# Patient Record
Sex: Male | Born: 1950
Health system: Southern US, Community
[De-identification: ages and names within clinical notes are randomized; demographics above are authoritative.]

## PROBLEM LIST (undated history)

## (undated) ENCOUNTER — Emergency Department (HOSPITAL_COMMUNITY): Admission: EM | Payer: Medicare Other | Source: Home / Self Care

## (undated) DIAGNOSIS — I219 Acute myocardial infarction, unspecified: Secondary | ICD-10-CM

## (undated) DIAGNOSIS — M797 Fibromyalgia: Secondary | ICD-10-CM

## (undated) DIAGNOSIS — E785 Hyperlipidemia, unspecified: Secondary | ICD-10-CM

## (undated) DIAGNOSIS — F329 Major depressive disorder, single episode, unspecified: Secondary | ICD-10-CM

## (undated) DIAGNOSIS — K219 Gastro-esophageal reflux disease without esophagitis: Secondary | ICD-10-CM

## (undated) DIAGNOSIS — I4891 Unspecified atrial fibrillation: Secondary | ICD-10-CM

## (undated) DIAGNOSIS — T8859XA Other complications of anesthesia, initial encounter: Secondary | ICD-10-CM

## (undated) DIAGNOSIS — J189 Pneumonia, unspecified organism: Secondary | ICD-10-CM

## (undated) DIAGNOSIS — I5030 Unspecified diastolic (congestive) heart failure: Secondary | ICD-10-CM

## (undated) DIAGNOSIS — F419 Anxiety disorder, unspecified: Secondary | ICD-10-CM

## (undated) DIAGNOSIS — Z8719 Personal history of other diseases of the digestive system: Secondary | ICD-10-CM

## (undated) DIAGNOSIS — I499 Cardiac arrhythmia, unspecified: Secondary | ICD-10-CM

## (undated) DIAGNOSIS — F32A Depression, unspecified: Secondary | ICD-10-CM

## (undated) DIAGNOSIS — R7989 Other specified abnormal findings of blood chemistry: Secondary | ICD-10-CM

## (undated) DIAGNOSIS — L0291 Cutaneous abscess, unspecified: Secondary | ICD-10-CM

## (undated) DIAGNOSIS — L0390 Cellulitis, unspecified: Secondary | ICD-10-CM

## (undated) DIAGNOSIS — Z91199 Patient's noncompliance with other medical treatment and regimen due to unspecified reason: Secondary | ICD-10-CM

## (undated) DIAGNOSIS — I10 Essential (primary) hypertension: Secondary | ICD-10-CM

## (undated) DIAGNOSIS — M1990 Unspecified osteoarthritis, unspecified site: Secondary | ICD-10-CM

## (undated) DIAGNOSIS — I2510 Atherosclerotic heart disease of native coronary artery without angina pectoris: Secondary | ICD-10-CM

## (undated) DIAGNOSIS — A419 Sepsis, unspecified organism: Secondary | ICD-10-CM

## (undated) DIAGNOSIS — Z9119 Patient's noncompliance with other medical treatment and regimen: Secondary | ICD-10-CM

## (undated) HISTORY — DX: Acute myocardial infarction, unspecified: I21.9

## (undated) HISTORY — DX: Depression, unspecified: F32.A

## (undated) HISTORY — DX: Sepsis, unspecified organism: A41.9

## (undated) HISTORY — DX: Other specified abnormal findings of blood chemistry: R79.89

## (undated) HISTORY — DX: Cellulitis, unspecified: L02.91

## (undated) HISTORY — DX: Anxiety disorder, unspecified: F41.9

## (undated) HISTORY — PX: LESION REMOVAL: SHX5196

## (undated) HISTORY — PX: NECK SURGERY: SHX720

## (undated) HISTORY — DX: Hyperlipidemia, unspecified: E78.5

## (undated) HISTORY — DX: Cutaneous abscess, unspecified: L03.90

## (undated) HISTORY — DX: Major depressive disorder, single episode, unspecified: F32.9

## (undated) HISTORY — PX: ESOPHAGOGASTRODUODENOSCOPY: SHX1529

## (undated) HISTORY — DX: Patient's noncompliance with other medical treatment and regimen: Z91.19

## (undated) HISTORY — DX: Patient's noncompliance with other medical treatment and regimen due to unspecified reason: Z91.199

---

## 1998-01-14 ENCOUNTER — Inpatient Hospital Stay (HOSPITAL_COMMUNITY): Admission: EM | Admit: 1998-01-14 | Discharge: 1998-01-15 | Payer: Self-pay | Admitting: Emergency Medicine

## 1998-05-18 HISTORY — PX: HERNIA REPAIR: SHX51

## 1998-05-18 HISTORY — PX: CORONARY STENT PLACEMENT: SHX1402

## 2004-07-31 ENCOUNTER — Emergency Department (HOSPITAL_COMMUNITY): Admission: EM | Admit: 2004-07-31 | Discharge: 2004-07-31 | Payer: Self-pay | Admitting: Emergency Medicine

## 2005-09-11 ENCOUNTER — Ambulatory Visit: Payer: Self-pay | Admitting: Pulmonary Disease

## 2005-09-11 ENCOUNTER — Inpatient Hospital Stay (HOSPITAL_COMMUNITY): Admission: EM | Admit: 2005-09-11 | Discharge: 2005-09-15 | Payer: Self-pay | Admitting: Emergency Medicine

## 2005-09-11 ENCOUNTER — Ambulatory Visit: Payer: Self-pay | Admitting: Internal Medicine

## 2005-09-13 ENCOUNTER — Encounter: Payer: Self-pay | Admitting: Pulmonary Disease

## 2005-09-15 ENCOUNTER — Ambulatory Visit: Payer: Self-pay | Admitting: Internal Medicine

## 2008-07-19 ENCOUNTER — Ambulatory Visit: Payer: Self-pay | Admitting: Cardiovascular Disease

## 2008-07-24 ENCOUNTER — Telehealth (INDEPENDENT_AMBULATORY_CARE_PROVIDER_SITE_OTHER): Payer: Self-pay | Admitting: *Deleted

## 2008-07-25 ENCOUNTER — Ambulatory Visit: Payer: Self-pay

## 2008-07-25 ENCOUNTER — Encounter: Payer: Self-pay | Admitting: Cardiology

## 2008-07-31 DIAGNOSIS — R079 Chest pain, unspecified: Secondary | ICD-10-CM | POA: Insufficient documentation

## 2008-07-31 DIAGNOSIS — E785 Hyperlipidemia, unspecified: Secondary | ICD-10-CM

## 2008-07-31 DIAGNOSIS — E1169 Type 2 diabetes mellitus with other specified complication: Secondary | ICD-10-CM | POA: Insufficient documentation

## 2008-08-02 ENCOUNTER — Encounter: Payer: Self-pay | Admitting: Cardiovascular Disease

## 2008-08-02 ENCOUNTER — Ambulatory Visit: Payer: Self-pay | Admitting: Cardiovascular Disease

## 2008-08-02 DIAGNOSIS — I152 Hypertension secondary to endocrine disorders: Secondary | ICD-10-CM | POA: Insufficient documentation

## 2008-08-02 DIAGNOSIS — I10 Essential (primary) hypertension: Secondary | ICD-10-CM

## 2008-08-02 DIAGNOSIS — E1159 Type 2 diabetes mellitus with other circulatory complications: Secondary | ICD-10-CM | POA: Insufficient documentation

## 2008-08-02 DIAGNOSIS — E119 Type 2 diabetes mellitus without complications: Secondary | ICD-10-CM | POA: Insufficient documentation

## 2008-08-02 LAB — CONVERTED CEMR LAB
BUN: 20 mg/dL (ref 6–23)
Basophils Absolute: 0 10*3/uL (ref 0.0–0.1)
Basophils Relative: 0.1 % (ref 0.0–3.0)
CO2: 28 meq/L (ref 19–32)
Calcium: 9.5 mg/dL (ref 8.4–10.5)
Chloride: 100 meq/L (ref 96–112)
Creatinine, Ser: 0.8 mg/dL (ref 0.4–1.5)
Eosinophils Absolute: 0.2 10*3/uL (ref 0.0–0.7)
Eosinophils Relative: 2.2 % (ref 0.0–5.0)
GFR calc non Af Amer: 105.6 mL/min (ref >60)
Glucose, Bld: 200 mg/dL — ABNORMAL HIGH (ref 70–99)
HCT: 45.5 % (ref 39.0–52.0)
Hemoglobin: 15.7 g/dL (ref 13.0–17.0)
INR: 1 (ref 0.8–1.0)
Lymphocytes Relative: 18.7 % (ref 12.0–46.0)
Lymphs Abs: 1.5 10*3/uL (ref 0.7–4.0)
MCHC: 34.6 g/dL (ref 30.0–36.0)
MCV: 86.4 fL (ref 78.0–100.0)
Monocytes Absolute: 0.8 10*3/uL (ref 0.1–1.0)
Monocytes Relative: 10.2 % (ref 3.0–12.0)
Neutro Abs: 5.7 10*3/uL (ref 1.4–7.7)
Neutrophils Relative %: 68.8 % (ref 43.0–77.0)
Platelets: 248 10*3/uL (ref 150.0–400.0)
Potassium: 4.3 meq/L (ref 3.5–5.1)
Prothrombin Time: 11.3 s (ref 10.9–13.3)
RBC: 5.26 M/uL (ref 4.22–5.81)
RDW: 11.9 % (ref 11.5–14.6)
Sodium: 135 meq/L (ref 135–145)
WBC: 8.2 10*3/uL (ref 4.5–10.5)

## 2008-08-08 ENCOUNTER — Ambulatory Visit: Payer: Self-pay | Admitting: Cardiology

## 2008-08-08 ENCOUNTER — Inpatient Hospital Stay (HOSPITAL_BASED_OUTPATIENT_CLINIC_OR_DEPARTMENT_OTHER): Admission: RE | Admit: 2008-08-08 | Discharge: 2008-08-08 | Payer: Self-pay | Admitting: Cardiology

## 2008-08-09 ENCOUNTER — Inpatient Hospital Stay (HOSPITAL_COMMUNITY): Admission: AD | Admit: 2008-08-09 | Discharge: 2008-08-10 | Payer: Self-pay | Admitting: Cardiology

## 2008-08-09 ENCOUNTER — Ambulatory Visit: Payer: Self-pay | Admitting: Cardiology

## 2008-08-22 ENCOUNTER — Ambulatory Visit: Payer: Self-pay | Admitting: Cardiovascular Disease

## 2008-08-22 ENCOUNTER — Encounter: Payer: Self-pay | Admitting: Cardiovascular Disease

## 2008-10-04 ENCOUNTER — Telehealth (INDEPENDENT_AMBULATORY_CARE_PROVIDER_SITE_OTHER): Payer: Self-pay | Admitting: *Deleted

## 2009-01-11 ENCOUNTER — Encounter (INDEPENDENT_AMBULATORY_CARE_PROVIDER_SITE_OTHER): Payer: Self-pay | Admitting: *Deleted

## 2009-04-16 ENCOUNTER — Encounter: Payer: Self-pay | Admitting: Cardiovascular Disease

## 2009-04-16 DIAGNOSIS — R011 Cardiac murmur, unspecified: Secondary | ICD-10-CM | POA: Insufficient documentation

## 2009-04-22 ENCOUNTER — Encounter (INDEPENDENT_AMBULATORY_CARE_PROVIDER_SITE_OTHER): Payer: Self-pay | Admitting: *Deleted

## 2009-04-25 LAB — CONVERTED CEMR LAB
ALT: 27 units/L (ref 0–53)
AST: 20 units/L (ref 0–37)
Albumin: 4.3 g/dL (ref 3.5–5.2)
Alkaline Phosphatase: 78 units/L (ref 39–117)
Basophils Absolute: 0.1 10*3/uL (ref 0.0–0.1)
Basophils Relative: 0.8 % (ref 0.0–3.0)
Bilirubin, Direct: 0.2 mg/dL (ref 0.0–0.3)
Eosinophils Absolute: 0.1 10*3/uL (ref 0.0–0.7)
Eosinophils Relative: 1.5 % (ref 0.0–5.0)
HCT: 50.5 % (ref 39.0–52.0)
Hemoglobin: 17.2 g/dL — ABNORMAL HIGH (ref 13.0–17.0)
Lymphocytes Relative: 19.1 % (ref 12.0–46.0)
Lymphs Abs: 1.5 10*3/uL (ref 0.7–4.0)
MCHC: 34.1 g/dL (ref 30.0–36.0)
MCV: 89.8 fL (ref 78.0–100.0)
Monocytes Absolute: 0.7 10*3/uL (ref 0.1–1.0)
Monocytes Relative: 9.2 % (ref 3.0–12.0)
Neutro Abs: 5.3 10*3/uL (ref 1.4–7.7)
Neutrophils Relative %: 69.4 % (ref 43.0–77.0)
Platelets: 264 10*3/uL (ref 150.0–400.0)
RBC: 5.63 M/uL (ref 4.22–5.81)
RDW: 12.1 % (ref 11.5–14.6)
Total Bilirubin: 1.2 mg/dL (ref 0.3–1.2)
Total Protein: 7.5 g/dL (ref 6.0–8.3)
WBC: 7.7 10*3/uL (ref 4.5–10.5)

## 2009-08-28 ENCOUNTER — Encounter (INDEPENDENT_AMBULATORY_CARE_PROVIDER_SITE_OTHER): Payer: Self-pay | Admitting: *Deleted

## 2010-06-17 NOTE — Assessment & Plan Note (Signed)
Summary: [facility]   CC:  no complaints flu from cath.  History of Present Illness: [name] [name] had his catheterization 2 weeks ago.  He had a total right coronary occlusion with collaterals.  I reviewed his films.  He had successful angioplasty and stenting by Dr. Dickie La.  Results were excellent.  He did not have significant left-sided disease.  Overall the function was normal.  His groin has healed well.  He was Angio-Seal since discharge she has had some mild pain in his feet.  He is a diabetic and has arthritis in his knees.  The pain sounds more arthritic or neuropathic.  There's been no evidence of distal embolization.  Is not a significant chest pain PND or orthopnea.  He is back to work full-time.  He's been compliant with his aspirin and Plavix.  His risk factors are otherwise well modified.  He continues to try to lose weight.  Current Problems (verified): 1)  Essential Hypertension, Benign  (ICD-401.1) 2)  Aodm  (ICD-250.00) 3)  Disorders of Iron Metabolism  (ICD-275.0) 4)  Hyperlipidemia-mixed  (ICD-272.4) 5)  Chest Pain-unspecified  (ICD-786.50)  Current Medications (verified): 1)  Glucophage 500 Mg Tabs (Metformin Hcl) .Marland Kitchen.. 1 Daily 2)  Lipitor 20 Mg Tabs (Atorvastatin Calcium) .Marland Kitchen.. 1 Qhs 3)  Lisinopril 20 Mg Tabs (Lisinopril) .Marland Kitchen.. 1 Tab Daily 4)  Kapidex 60 Mg Cpdr (Dexlansoprazole) .Marland Kitchen.. 1 Daily 5)  Arthrotec 75mg  .... 1 Tab By Mouth Once Daily 6)  Plavix 75 Mg Tabs (Clopidogrel Bisulfate) .Marland Kitchen.. 1 Tab By Mouth Once Daily 7)  Aspirin 81 Mg  Tabs (Aspirin) .... 4 Tabs Qam 8)  Fish Oil   Oil (Fish Oil) 1000mg  .... 2 Tabs At Bedtime  Allergies: 1)  * Horse Serum 2)  * Shellfish  Past History:  Past Medical History:    atypical chest pain    Diabetes Type 1    Hypertension    hx of reflux (07/31/2008)  Social History:    Full Time    Married     Tobacco Use - Former. quit 2006    Alcohol Use - yes    Regular Exercise - no    Drug Use - no     (07/31/2008)  Review  of Systems       Denies fever, malais, weight loss, blurry vision, decreased visual acuity, cough, sputum, SOB, hemoptysis, pleuritic pain, palpitaitons, heartburn, abdominal pain, melena, lower extremity edema, claudication, or rash.   Vital Signs:  Patient profile:   60 year old male Weight:      267 pounds Pulse rate:   80 / minute Resp:     12 per minute BP sitting:   142 / 90  (left arm)  Vitals Entered By: Kem Parkinson (August 22, 2008 10:55 AM)  Physical Exam  General:  Affect appropriate Healthy:  appears stated age HEENT: normal Neck supple with no adenopathy JVP normal no bruits no thyromegaly Lungs clear with no wheezing and good diaphragmatic motion Heart:  S1/S2 no murmur,rub, gallop or click PMI normal Abdomen: benighn, BS positve, no tenderness, no AAA no bruit.  No HSM or HJR Distal pulses intact with no bruits No edema Neuro non-focal Skin warm and dry Cath site well healed with no bruit   Impression & Recommendations:  Problem # 1:  CHEST PAIN-UNSPECIFIED (ICD-786.50) Recent stent to total RCA by Dr. Juanda Chance.  Continue Plavix for a year His updated medication list for this problem includes:    Lisinopril 20 Mg Tabs (  Lisinopril) .Marland Kitchen... 1 tab daily    Plavix 75 Mg Tabs (Clopidogrel bisulfate) .Marland Kitchen... 1 tab by mouth once daily    Aspirin 81 Mg Tabs (Aspirin) .Marland KitchenMarland KitchenMarland KitchenMarland Kitchen 4 tabs qam  Problem # 2:  ESSENTIAL HYPERTENSION, BENIGN (ICD-401.1) Continue meds and low sodium diet His updated medication list for this problem includes:    Lisinopril 20 Mg Tabs (Lisinopril) .Marland Kitchen... 1 tab daily    Aspirin 81 Mg Tabs (Aspirin) .Marland KitchenMarland KitchenMarland KitchenMarland Kitchen 4 tabs qam  Problem # 3:  AODM (ICD-250.00) HbA1c per primary.  Continue low carb diet His updated medication list for this problem includes:    Glucophage 500 Mg Tabs (Metformin hcl) .Marland Kitchen... 1 daily    Lisinopril 20 Mg Tabs (Lisinopril) .Marland Kitchen... 1 tab daily    Aspirin 81 Mg Tabs (Aspirin) .Marland KitchenMarland KitchenMarland KitchenMarland Kitchen 4 tabs qam  Problem # 4:  HYPERLIPIDEMIA-MIXED  (ICD-272.4) Lipid and liver in 6 months. His updated medication list for this problem includes:    Lipitor 20 Mg Tabs (Atorvastatin calcium) .Marland Kitchen... 1 qhs  Patient Instructions: 1)  F/U Dr. Eden Emms 6 months 2)  Continue ASA and Plavix !!!

## 2010-06-17 NOTE — Letter (Signed)
Summary: Appointment - Reminder 2  [facility], Main Office  [address]   [place], [place] [address]   Phone: [phone]  Fax: [phone]     August 28, 2009 MRN: [id number]   CHARLI LIBERATORE [address] [place], [place]  [address]   Dear Mr. Hornaday,  Our records indicate that it is time to schedule a follow-up appointment with Dr. Eden Emms. It is very important that we reach you to schedule this appointment. We look forward to participating in your health care needs. Please contact us at the number listed above at your earliest convenience to schedule your appointment.  If you are unable to make an appointment at this time, give [place] a call so we can update our records.     Sincerely,   Migdalia Dk [facility] Scheduling Team

## 2010-06-17 NOTE — Progress Notes (Signed)
   Phone Note From Other Clinic   Caller: pts dentist office Summary of Call: CALLED PT IN CHAIR TO HAVE TOOTH EXTRACTED  TOOTH VERY LOOSE  CONCERN  CAUSE   PT IS ON PLAVIX OKAY TO PULL PER DR Eden Emms .Zack Seal

## 2010-08-28 LAB — CBC
HCT: 40 % (ref 39.0–52.0)
HCT: 41.3 % (ref 39.0–52.0)
HCT: 44 % (ref 39.0–52.0)
Hemoglobin: 13.7 g/dL (ref 13.0–17.0)
Hemoglobin: 14.5 g/dL (ref 13.0–17.0)
Hemoglobin: 15.6 g/dL (ref 13.0–17.0)
MCHC: 34.3 g/dL (ref 30.0–36.0)
MCHC: 35.2 g/dL (ref 30.0–36.0)
MCHC: 35.6 g/dL (ref 30.0–36.0)
MCV: 86.3 fL (ref 78.0–100.0)
MCV: 86.5 fL (ref 78.0–100.0)
MCV: 86.6 fL (ref 78.0–100.0)
Platelets: 262 10*3/uL (ref 150–400)
Platelets: 270 10*3/uL (ref 150–400)
Platelets: 302 10*3/uL (ref 150–400)
RBC: 4.61 MIL/uL (ref 4.22–5.81)
RBC: 4.79 MIL/uL (ref 4.22–5.81)
RBC: 5.08 MIL/uL (ref 4.22–5.81)
RDW: 12.4 % (ref 11.5–15.5)
RDW: 12.5 % (ref 11.5–15.5)
RDW: 12.7 % (ref 11.5–15.5)
WBC: 10.8 10*3/uL — ABNORMAL HIGH (ref 4.0–10.5)
WBC: 13.1 10*3/uL — ABNORMAL HIGH (ref 4.0–10.5)
WBC: 13.3 10*3/uL — ABNORMAL HIGH (ref 4.0–10.5)

## 2010-08-28 LAB — BASIC METABOLIC PANEL
BUN: 23 mg/dL (ref 6–23)
BUN: 23 mg/dL (ref 6–23)
CO2: 26 mEq/L (ref 19–32)
CO2: 27 mEq/L (ref 19–32)
Calcium: 9.2 mg/dL (ref 8.4–10.5)
Calcium: 9.5 mg/dL (ref 8.4–10.5)
Chloride: 102 mEq/L (ref 96–112)
Chloride: 104 mEq/L (ref 96–112)
Creatinine, Ser: 0.97 mg/dL (ref 0.4–1.5)
Creatinine, Ser: 1.03 mg/dL (ref 0.4–1.5)
GFR calc Af Amer: >60 mL/min (ref >60)
GFR calc Af Amer: >60 mL/min (ref >60)
GFR calc non Af Amer: >60 mL/min (ref >60)
GFR calc non Af Amer: >60 mL/min (ref >60)
Glucose, Bld: 217 mg/dL — ABNORMAL HIGH (ref 70–99)
Glucose, Bld: 233 mg/dL — ABNORMAL HIGH (ref 70–99)
Potassium: 3.9 mEq/L (ref 3.5–5.1)
Potassium: 4.1 mEq/L (ref 3.5–5.1)
Sodium: 135 mEq/L (ref 135–145)
Sodium: 136 mEq/L (ref 135–145)

## 2010-08-28 LAB — GLUCOSE, CAPILLARY
Glucose-Capillary: 220 mg/dL — ABNORMAL HIGH (ref 70–99)
Glucose-Capillary: 226 mg/dL — ABNORMAL HIGH (ref 70–99)
Glucose-Capillary: 229 mg/dL — ABNORMAL HIGH (ref 70–99)
Glucose-Capillary: 272 mg/dL — ABNORMAL HIGH (ref 70–99)
Glucose-Capillary: 334 mg/dL — ABNORMAL HIGH (ref 70–99)

## 2010-08-28 LAB — POCT I-STAT GLUCOSE
Glucose, Bld: 239 mg/dL — ABNORMAL HIGH (ref 70–99)
Operator id: 194801

## 2010-09-30 NOTE — Cardiovascular Report (Signed)
NAMETODDRICK, SANNA                  ACCOUNT NO.:  [id number]   MEDICAL RECORD NO.:  [id number]          PATIENT TYPE:  OIB   LOCATION:  1961                         FACILITY:  [facility]   PHYSICIAN:  [name], MD, FACCDATE OF BIRTH:  09-14-1950   DATE OF PROCEDURE:  DATE OF DISCHARGE:  08/08/2008                            CARDIAC CATHETERIZATION   CLINICAL HISTORY:  Mr. Hudnall is 60 year old and was recently evaluated  by Dr. Eden Emms for chest pain.  He had a Myoview scan, which suggested a  possible old inferior infarction and Dr. Eden Emms arranged for him to be  evaluated with angiography.  We studied him yesterday in the [facility] and  found a chronic total occlusion of the right coronary artery, which we  thought was fairly recent.  We brought him back today for intervention.  He also is diabetic and metformin was held.   PROCEDURE:  The procedure was performed via the right femoral arteries  and arterial sheath, and a 6-French AL-1 guiding catheter.  The patient  was given antiemetics bolus infusion and had been previously loaded with  Plavix and aspirin.  We were able to navigate a PT2 Light Support long  wire across the lesion without too much difficulty.  The lesion  had  TIMI I flow.  There was a small channel, which we were able to navigate.  We then predilated the lesion with a 2.25 x 20-mm Voyager balloon  performing two inflations up to 10 atmospheres for 30 seconds.  We then  deployed a 2.75 x 23-mm Xience stent deploying this with one inflation  of 14 atmospheres for 30 seconds.  We then postdilated with a 3.25 x 15-  mm [place] Voyager performing 2 inflation up to 16 atmospheres for 30  seconds.  After completion of the postdilatation, it appeared there was  a small filling defect in the proximal edge of the stent.  There is also  questionable lesion proximal to the stent.  For this reason, we  performed IVUS with the Atlantis ultrasound catheter.  This demonstrated  what  appeared to be a small amount of thrombus in the proximal portion  of the stent.  There was a lesion proximal to the stent.  This did not  appear to be very tight and there was no edge tear.  We felt the stent  was somewhat undersized for the vessel even though it was well opposed,  so went back in with a 3.5 x 20-mm [place] Voyager performing 2 inflations up  to 16 atmospheres for 30 seconds.  There still was a very small residual  filling defect in the proximal portion of the stent, which we thought  probably represented thrombus, so we started him on double bolus  Integrilin infusion.   The patient tolerated the procedure well.  The right femoral artery was  closed with Angio-Seal at the end of the procedure.   RESULTS:  Initially, stenosis in the proximal LAD was 99% with a trickle  of TIMI I flow.  The distal vessel did fill by collaterals.  Following  stenting,  this improved to 0%.  There was a small residual filling  defect in the very proximal portion of the stent.   CONCLUSION:  Successful PCI of the chronic totally occluded right  coronary artery using a Xience drug-eluting stent and IVUS guidance with  improvement central narrowing from 99% to 0%.   DISPOSITION:  The patient returned to [facility] room for further  observation.  He should remain on Plavix for at least a year.      Bruce Elvera Lennox Juanda Chance, MD, [facility]  Electronically Signed     BRB/MEDQ  D:  08/09/2008  T:  08/10/2008  Job:  [id number]   cc:   Noralyn Pick. Eden Emms, MD, [facility]

## 2010-09-30 NOTE — Assessment & Plan Note (Signed)
[facility]                            CARDIOLOGY OFFICE NOTE   NAME:[name] [name], [name] [name]                         MRN:          [id number]  DATE:07/19/2008                            DOB:          12-Nov-1950    Mr. Blanda is a delightful 60 year old patient referred by Dr. Paulita Cradle for question need for heart catheterization and he had 2  episodes of chest pain.  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  Initial episode lasted about 48  hours.  It did sound musculoskeletal in nature.  There was no associated  diaphoresis.  He had mild shortness of breath.  There were no  palpitations or presyncope.  Subsequently, he had a shorter episode a  few days later.  Prior to this, he had not had any significant chest  pains.  Back in 1997, he believes, he had a heart catheterization by the  [facility], which he says was normal.   He has not had a followup stress test since then.  Fortunately, he quit  smoking 4 years ago.  His risk factors are still significant however, he  has hypercholesterolemia, non-insulin-dependent diabetes, and  hypertension.  He is very fearful of sugar pills.  Apparently, he was on  another oral hypoglycemic and had 2 hypoglycemic episodes, which scared  him, I assured him that the Glucophage would not do this.  In general,  he is sedentary and has a fairly poor diet.  He basically likes to watch  TV and work on cars when he is not at his job.   REVIEW OF SYSTEMS:  Otherwise negative.   PAST MEDICAL HISTORY:  Remarkable for hypertension,  hypercholesterolemia, non-insulin-dependent diabetes, previous smoking,  quitting 4 years ago, significant arthritis from his work.  He has not  had previous surgery.  The patient is divorced and remarried.  Unfortunately, he is estranged from his 2 older children.  He drinks on  occasion and quit smoking 4 years ago.  Prior to this though he smoked  2  packs a day.   MEDICATIONS:  1. Glucophage 500 a day.  2. Lipitor 20 a day.  3. Lisinopril 20 a day.  4. Kapidex 60 a day.  5. Arthrotec 75 b.i.d.   ALLERGIES:  No known allergies.   PHYSICAL EXAMINATION:  GENERAL:  Remarkable for an overweight jovial  male in no distress.  VITAL SIGNS:  Blood pressure is 130/80, pulse 80 and regular,  respiratory rate 14, afebrile.  HEENT:  Unremarkable.  NECK:  Carotids are normal without bruit.  No lymphadenopathy,  thyromegaly, or JVP elevation.  LUNGS:  Clear.  Good diaphragmatic motion.  No wheezing.  CARDIAC:  S1 and S2.  Normal heart sounds.  PMI normal.  ABDOMEN:  Benign.  Bowel sounds positive.  No AAA.  No tenderness.  No  bruit.  No hepatosplenomegaly.  No hepatojugular reflux.  EXTREMITIES:  Distal pulses are intact.  No edema.  NEUROLOGIC:  Nonfocal.  SKIN:  Warm and dry.  No muscular weakness.   EKG  shows sinus rhythm, nonspecific ST-T wave changes with lateral T-  wave inversions.   I do not have an old EKG on him.   IMPRESSION:  1. Two isolated episodes of atypical chest pain in the patient with      multiple coronary risk factors that had a normal cath and back in      the late 90s.  I do not think we need to rush in to do a heart      catheterization.  We will start by doing a stress Myoview.  He will      continue his risk factor modification and an aspirin a day.  2. Hypertension, currently well controlled.  Continue current dose of      angiotensin-converting enzyme inhibitor, low-sodium diet.  3. Diabetes.  I encouraged the patient to look at the new [employer].  He seems to have some issues in regards to limiting      carbohydrates, but I think he can do a better job.  I related to      him how all of his medical problems including hypertension,      diabetes, and hypercholesterolemia are related to his weight.  His      arthritis would also be improved with weight loss, although this is       certainly secondary to his manual labor in job.  4. History of reflux.  Continue Kapidex.  I will see Mr. Weisgerber in 6      months unless his Myoview is abnormal.     Theron Arista C. Eden Emms, MD, [facility]  Electronically Signed    PCN/MedQ  DD: 07/19/2008  DT: 07/20/2008  Job #: [id number]

## 2010-09-30 NOTE — Discharge Summary (Signed)
NAMEBERTEL, [name] [name]                  ACCOUNT NO.:  [id number]   MEDICAL RECORD NO.:  [id number]          PATIENT TYPE:  INP   LOCATION:  2502                         FACILITY:  [facility]   PHYSICIAN:  Everardo Beals. Juanda Chance, MD, FACCDATE OF BIRTH:  1950-06-29   DATE OF ADMISSION:  08/09/2008  DATE OF DISCHARGE:  08/10/2008                               DISCHARGE SUMMARY   PRIMARY CARDIOLOGIST:  Theron Arista C. Eden Emms, MD, [facility].   PRIMARY CARE Mort Smelser:  [facility].   DISCHARGE DIAGNOSIS:  Chest pain.   SECONDARY DIAGNOSES:  1. Coronary artery disease status post successful percutaneous      coronary intervention and stenting of a chronic totally occluded      right coronary artery with placement of a 2.75 x 23 mm XIENCE V      drug-eluting stent.  2. Hyperlipidemia.  3. Hypertension.  4. Diabetes mellitus.   ALLERGIES:  HORSE SERUM and SHELLFISH.   PROCEDURES:  Successful percutaneous coronary intervention and stenting  of the right coronary artery with placement of a 2.75 x 23 mm XIENCE V  drug-eluting stent.   HISTORY OF PRESENT ILLNESS:  A 60 year old male with the above problem  list.  He recently saw Dr. Eden Emms in clinic on July 19, 2008 with the  complaints of atypical chest discomfort and subsequently underwent  Myoview stress testing revealing an EF of 47% with convincing evidence  of previous inferior MI.  After discussion with the patient about  cardiac catheterization which took place on  August 08, 2008,  catheterization revealed an occluded right coronary artery with left-to-  right collaterals and normal LV function with an EF of 55%.  It was felt  that percutaneous intervention maybe beneficial given his symptoms.   HOSPITAL COURSE:  The patient represented to the cardiac cath lab on  August 09, 2008 and underwent successful opening of the chronic totally  occluded right coronary artery with placement of a 2.75 x 23 mm XIENCE V  drug-eluting stent.  The patient  tolerated the procedure well and  postprocedure has been ambulating without recurrent symptoms or  limitations.  He will be discharged home today in good condition.   DISCHARGE LABS:  Hemoglobin 13.7, hematocrit 40.0, WBC 13.1, platelets  262.  Sodium 135, potassium 3.9, chloride 104, CO2 26, BUN 23,  creatinine 0.97, glucose 217, and calcium 9.2.   DISPOSITION:  The patient will be discharged home today in good  condition.   FOLLOWUP PLANS AND APPOINTMENTS:  We will arrange for followup with Dr.  Eden Emms on August 22, 2008  at 11 a.m.   DISCHARGE MEDICATIONS:  1. Aspirin 325 mg daily.  2. Plavix 75 mg daily.  3. Lipitor 20 mg nightly.  4. Lisinopril 20 mg daily.  5. Capadex 60 mg daily.  6. Arthrotec 75 b.i.d.  7. Glucophage 500 mg daily, to be resumed on August 12, 2008.  8. Fish oil 2 tablets daily.  9. Nitroglycerin 0.4 mg sublingual p.r.n. chest pain.   OUTSTANDING LAB STUDIES:  None.   DURATION OF DISCHARGE/ENCOUNTER:  35 minutes including physician  time.      Nicolasa Ducking, ANP      [name], MD, [facility]  Electronically Signed    CB/MEDQ  D:  08/10/2008  T:  08/11/2008  Job:  [id number]   cc:   [facility]

## 2010-09-30 NOTE — Cardiovascular Report (Signed)
[name] [name], [name] [name]                  ACCOUNT NO.:  [id number]   MEDICAL RECORD NO.:  [id number]          PATIENT TYPE:  OIB   LOCATION:  1961                         FACILITY:  [facility]   PHYSICIAN:  [name], MD, FACCDATE OF BIRTH:  1950/11/03   DATE OF PROCEDURE:  08/08/2008  DATE OF DISCHARGE:  08/08/2008                            CARDIAC CATHETERIZATION   CLINICAL HISTORY:  [name] is a 60 year old and has diabetes.  He  recently has had chest pain.  He was seen by Dr. Eden [name], had a Myoview  scan, which suggested inferior scar or ischemia and Dr. Eden [name] arranged  for him to come in for evaluation with angiography.  His symptoms began  about 2 weeks ago.   PROCEDURE:  The procedure was followed by femoral arterial sheath and 4-  [place] preformed coronary catheters.  A front wall arterial puncture was  performed, and Omnipaque contrast was used.  The patient tolerated the  procedure well and left the laboratory in satisfactory condition.   RESULTS:  Left main coronary artery:  Underlying left main coronary  artery is free of significant disease.   Left anterior descending artery:  Left anterior descending artery gave  rise to 2 septal perforators and a small and larger diagonal branch.  There was a 50% lesion after the 2 septal perforators and 2 diagonal  branches in the proximal to midvessel.  There were mild irregularities  elsewhere, but no other significant obstruction.   The circumflex artery:  The circumflex artery gave rise to a small ramus  branch, a large marginal branch and 2 posterolateral branches.  These  vessels were free of significant disease.   The right coronary artery:  The coronary artery was completely and  proximally elevated with small penetration of the occlusion with TIMI I  flow.  The distal vessel consisted of a posterior descending branch,  which filled via collaterals from the LAD.   The left ventriculogram.  Please note, the left  ventriculogram performed  in the RAO projection shows good wall motion with no areas of  hypokinesis.  Estimated ejection fraction is 55%.   HEMODYNAMIC DATA:  The aortic pressure was 123/74 with a mean of 95.  Left ventricular pressure was 123/11.   CONCLUSION:  Coronary artery disease with 50% narrowing in the proximal  mid-left anterior descending, no significant obstruction of circumflex  artery, and total occlusion of the right coronary artery with  collaterals from the left coronary artery and normal left ventricular  function with estimated ejection fraction of 55%.   RECOMMENDATIONS:  The patient has a chronic total occlusion of the right  coronary artery.  Based on symptoms, this appears to probably be present  for about 2 weeks.  He does have TIMI I flow and I think our chances of  getting this opened are reasonably good.  I will discuss this with the  patient, if he is agreeable, we will plan to bring him back tomorrow for  intervention.      Bruce Elvera Lennox Juanda Chance, MD, [facility]  Electronically Signed     BRB/MEDQ  D:  08/08/2008  T:  08/08/2008  Job:  [id number]   cc:   [name] Pick. Eden Emms, MD, [facility]

## 2010-10-03 NOTE — Discharge Summary (Signed)
[name] [name], [name] [name]                  ACCOUNT NO.:  [id number]   MEDICAL RECORD NO.:  [id number]          PATIENT TYPE:  INP   LOCATION:  IC07                          FACILITY:  [facility]   PHYSICIAN:  Osvaldo Shipper, MD     DATE OF BIRTH:  May 24, 1950   DATE OF ADMISSION:  09/11/2005  DATE OF DISCHARGE:  LH                                 DISCHARGE SUMMARY   The patient is being transferred to [facility] ICU within the next one hour.  This is a transfer summary.   DISCHARGE TRANSFER DIAGNOSES:  1.  Caustic alkali ingestion leading to epiglottitis with respiratory      failure.  2.  History of hypertension.   This patient is a 60 year old Caucasian male with a past medical history of  hypertension, coronary artery disease, who apparently was doing well, until  earlier today when he was trying to siphon alkali cleaning solution and he  had ingestion of the solution.  He rapidly developed pain and burning  sensation in the back of his mouth and down his esophagus.  He drank some  water and some milk and a full bottle of Pepto-Bismol.  He got some  transient relief, however, his pain got worse and he decided to come to the  emergency room.  The patient underwent emergent EGD which apparently just  showed some mild inflammation in his esophagus.  No ulcers.  However,  epiglottis was seen to be significantly inflamed.  There was an acute  concern about impending respiratory failure and the patient was electively  intubated.  ENT was consulted, when Dr. Annalee Genta recommended the patient be  transferred to a tertiary level of care.  Dr. Jena Gauss who initially saw this  patient, tried to contact [facility] ICU as well as [facility] ICU but  they do not have beds available.   I came in for a night shift and was attempting to contact [facility],  however, I was informed by the elink physician that a bed might be opening  up at [facility] ICU.  A bed is available at this time and the patient  will  be transferred to [facility] via [facility] within the next half an  hour to one hour.   The patient continues to be critical at this time.  His vital signs while  here are stable.  He has been in a sinus bradycardia.  His blood pressure is  running a little bit low but stable.  The patient is sedated.  His ABGs look  appropriate.  His FIO2 requirements will be reduced.  His blood work that  was obtained was also unremarkable.   MEDICATIONS:  That were started included clindamycin, Solu-Medrol, Protonix.  The patient was prescribed Levaquin as well.  He is on a Diprivan protocol  for sedation.   Further management will be done at [facility] for this sick  patient.      Osvaldo Shipper, MD  Electronically Signed     GK/MEDQ  D:  09/11/2005  T:  09/11/2005  Job:  [id number]   cc:   R. Roetta Sessions, M.D.  [address]  [place]  [place] [address]

## 2010-10-03 NOTE — Discharge Summary (Signed)
NAMEALVEY, [name] [name]                  ACCOUNT NO.:  [id number]   MEDICAL RECORD NO.:  [id number]          PATIENT TYPE:  INP   LOCATION:  2102                         FACILITY:  [facility]   PHYSICIAN:  Coralyn Helling, M.D.      DATE OF BIRTH:  Apr 18, 1951   DATE OF ADMISSION:  09/11/2005  DATE OF DISCHARGE:  09/15/2005                                 DISCHARGE SUMMARY   DISCHARGE DIAGNOSIS:  Upper airway obstruction secondary to caustic alkali  accidental ingestion.   PROCEDURES:  Laryngoscopy, esophagogastroduodenoscopy.   The patient was transferred from [facility] on September 11, 2005.  He  had initially presented to [facility] after driving himself there  to the emergency room.  He was siphoning an alkali cleaning solution while  at work and ingested this.  This fluid had a pH of 13 to 15.5.  He  subsequently developed burning pain in his mouth.  He drink water and a full  bottle of Pepto-Bismol.  While at [facility] he had undergone EGD  and was found to have marked swelling of his epiglottis.  He subsequently  developed respiratory distress and was intubated.  Due to concern of  possible esophageal rupture, the decision was made to have him transferred  to [facility].  He remained on the ventilator.  He had undergone a  repeat EGD here which did not show any signs of trauma.  He subsequently  underwent a laryngoscopy and this showed significant decrease in the  supraglottic edema and the airway appeared stable.  He was subsequently  extubated on September 14, 2005, and had remained stable ever since.   His discharge medications are to include:  1.  Augmentin elixir 400 mg in 500 mL, one teaspoon p.o. b.i.d. x10 days.  2.  Protonix 40 mg p.o. daily.  3.  Prednisone 20 mg p.o. daily x2 days, then 10 mg p.o. daily x2 days, then      5 mg p.o. daily x2 days, then stop.   He is to avoid using ACE inhibitors until he has followup with Dr.  Annalee Genta.  He is  advised to continue on a soft diet until followup with Dr.  Annalee Genta.  He has a followup appointment with Dr. Annalee Genta on Oct 01, 2005, at 1 p.m., and he is not to return to work until after followup with  Dr. Annalee Genta.  Additionally, he is to have followup with Dr. Zenda Alpers at  [facility].      Coralyn Helling, M.D.  Electronically Signed     VS/MEDQ  D:  09/15/2005  T:  09/15/2005  Job:  [id number]   cc:   Onalee Hua L. Annalee Genta, M.D.  Fax: [phone]   Dr., [facility] [place]. Marina Goodell, M.D. [facility]  [address]  [place]  [place] [address]

## 2010-10-03 NOTE — H&P (Signed)
[name] [name], [name] [name]                  ACCOUNT NO.:  [id number]   MEDICAL RECORD NO.:  [id number]          PATIENT TYPE:  INP   LOCATION:  IC07                          FACILITY:  [facility]   PHYSICIAN:  Calvert Cantor, M.D.     DATE OF BIRTH:  02-Oct-1950   DATE OF ADMISSION:  09/11/2005  DATE OF DISCHARGE:  LH                                HISTORY & PHYSICAL   The patient is currently intubated.   HISTORY OF PRESENT ILLNESS:  The history is obtained from Dr. Kendell Bane, Dr.  Katrinka Blazing, and the ER record.  There is no family available, and the patient is  intubated.   The patient was admitted to the ER today after ingestion a caustic liquid.  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  He came into the ER  complaining of odynophagia.  GI consult was called.  Dr. Kendell Bane saw the  patient and did an EGD.  The EGD showed marked edema of the esophagus.  There was some fluid in the stomach, which was suctioned; however, no ulcers  were seen.  As per the endoscopy, the patient was still in the PACU, and he  developed stridor.  He was taken to the OR for emergency intubation.  He was  able to be intubated.  He was then brought back to the ICU, where he pushed  out his endotracheal tube.  He was then reintubated.  Since then, he has  been paralyzed.  He is also on sedation, and he is currently stable.   Dr. Kendell Bane has tried to transfer him.  He has spoken with [facility] and  with [facility].  There are no beds available in either hospital.  Therefore,  the patient is required to stay here.  Dr. Kendell Bane has then called [place] to take  over the care of the patient.   I have spoken with Poison Control.  They had already spoken with Dr.  Margretta Ditty earlier.  Their recommendations are to give him IV fluids and to  make sure good urine output is obtained.  His stool and urine should be  monitored for blood.  They are saying that steroids  and antibiotics are  controversial and do not recommend it either way.   PAST MEDICAL HISTORY:  Obtained from Dr. Luvenia Starch H&P.  1.  Depression.  2.  Hypertension.  3.  Cyst on his right hand.   PAST SURGICAL HISTORY:  1.  Umbilical hernia repair.  2.  Esophageal dilatation.   MEDICATIONS:  1.  Paxil.  2.  Altace 2 mg daily.   ALLERGIES:  No known drug allergies.   SOCIAL HISTORY:  Unobtainable.  He works in a car wash.   PHYSICAL EXAMINATION:  VITAL SIGNS:  His blood pressure was 164/84, pulse  63, respiratory rate 20.  His most recent blood pressure 103/73 with a heart  rate of 63 currently.  GENERAL:  Currently, he is intubated and sedated.  HEENT:  Pupils are equal, round and reactive to light.  Conjunctivae are  pink.  LUNGS:  Clear bilaterally.  HEART:  Regular rate and rhythm.  No murmurs.  ABDOMEN:  Soft.  Bowel sounds are positive.  There is no distention.  Difficult to assess tenderness because he is sedated.  EXTREMITIES:  No clubbing, cyanosis or edema.  Pedal pulses are positive.  SKIN:  No rashes or bruises.  NEUROLOGIC:  Difficult to assess.   Chest x-Jerrik status post intubation shows satisfactory ET tube position and  bibasilar atelectasis.   Blood work is pending.   EKG is pending.   UA is pending.   The monitor is showing sinus rhythm, ranging between 50-65 beats per minute.   ASSESSMENT/PLAN:  A 60 year old white male who is seen for ingestion of an  alkaline fluid.  Currently we are unable to tell how much has been absorbed  into his body.  He has swelling of his epiglottis.  He has been given  steroids for this, which we can continue.  We will continue him on the  ventilator.  I will order an ABG.  In addition, I have ordered a CBC, a  complete MET panel, a PT/INR, and a UA.  We need to monitor for any GI  bleed.  Need to monitor his H&H.   He will be on DVT prophylaxis with SCD stockings and TED hose for now, until  we are sure that he is not  bleeding.  He has a history of hypertension but  currently his blood pressure is a little bit low.  Therefore, will hold off  on giving him any antihypertensives.   He will be transferred to a tertiary care facility when a bed is available.   Dr. Kendell Bane is going to continue to be on consult.  He will eventually need  an ENT consult as well.   Time taken was 65 minutes.      Calvert Cantor, M.D.  Electronically Signed     SR/MEDQ  D:  09/11/2005  T:  09/11/2005  Job:  [id number]

## 2010-10-03 NOTE — Op Note (Signed)
NAMEZACKARIA, BURKEY                  ACCOUNT NO.:  [id number]   MEDICAL RECORD NO.:  [id number]          PATIENT TYPE:  EMS   LOCATION:  ED                            FACILITY:  [facility]   PHYSICIAN:  R. Roetta Sessions, M.D. DATE OF BIRTH:  03-01-1951   DATE OF PROCEDURE:  09/11/2005  DATE OF DISCHARGE:                                 OPERATIVE REPORT   PROCEDURE:  Emergency esophagogastroduodenoscopy.   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  He spat it out immediately, says he did not swallow anything, but  has had severe pain and odynophagia, pointing to his suprasternal notch,  ever since.  This occurred about 11:30 today.  He was seen by Dr. Margretta Ditty.  He was given an unknown volume of water, which he ingested.  For first aid  Mr. San drank some milk and Pepto Bismol.  This occurred in [place].  He came up to [facility], where he has been evaluated.  Dr.  Margretta Ditty called me.  I have seen Mr. Vassar, and he has no respiratory  distress and no stridor.  He is undergoing an urgent EGD.  This approach has  been discussed with the patient and patient's wife, the potential risks,  benefits, and alternatives have been reviewed, questions answered.   PROCEDURE NOTE:  O2 saturation, blood pressure, pulse, and respiration were  monitored throughout the entire procedure.   CONSCIOUS SEDATION:  Versed 3 mg IV, Demerol 75 mg IV, Cetacaine spray for  topical pharyngeal anesthesia.   INSTRUMENT USED:  Olympus video chip system.   FINDINGS:  Examination of the hypopharynx revealed edema of the  hypopharyngeal tissues.  The epiglottis was markedly edematous and took on a  horseshoe appearance.  The opening in the airway was significantly  compromised.  I could see the vocal cords, but  there was a significant  compromise of the lumen at this level.  I easily intubated the esophagus.  There was quite a bit of mucus and overlying exudate on the esophagus.  I  did not spend much time looking at the esophagus, although I did wash the  esophageal mucosa with water.  The EG junction was easily traversed.  There  was no stricture.  There was quite a bit of liquid debris in the stomach.  I  suctioned most of it out.  The gastric mucosa was incompletely seen.  The  pyloric channel was patent and traversed.  Examination of the bulb and  second portion revealed no abnormalities.  I called Dr. Tollie Eth in, our  anesthesiologist, to look at the hypopharynx as I pulled the scope back out  of the esophagus.  Pictures were taken.  The procedure was then terminated.  The patient tolerated this procedure well.   IMPRESSION:  1.  Marked chemical epiglottitis with surrounding soft tissue edema and      significant compromise of  the airway.  The patient does not at this      point have any stridor or obvious respiratory distress.  Exudate      covering the esophagus looked as though much of it washed off the      esophagus.  2.  Stomach incompletely seen.  Liquid in stomach was suctioned out.  3.  Patent pylorus, normal-appearing D1, D2.   PLAN:  1.  The patient will be admitted to the ICU and is in the process of      receiving racemic epinephrine and IV Decadron.  2.  Will consult Dr. Katrinka Blazing to be on standby in case he has airway      compromise.  3.  He will need to have another EGD in 24-48 hours.  Further      recommendations to follow.      Jonathon Bellows, M.D.  Electronically Signed     RMR/MEDQ  D:  09/11/2005  T:  09/12/2005  Job:  [id number]   cc:   Montey Hora, P.A.-C.  [facility]  [place], [place]

## 2011-10-25 ENCOUNTER — Inpatient Hospital Stay (HOSPITAL_COMMUNITY)
Admission: EM | Admit: 2011-10-25 | Discharge: 2011-10-29 | DRG: 313 | Disposition: A | Discharge status: Home / Self Care | Payer: 59 | Attending: Internal Medicine | Admitting: Internal Medicine

## 2011-10-25 ENCOUNTER — Encounter (HOSPITAL_COMMUNITY): Payer: Self-pay | Admitting: *Deleted

## 2011-10-25 ENCOUNTER — Emergency Department (HOSPITAL_COMMUNITY): Payer: Self-pay

## 2011-10-25 DIAGNOSIS — E118 Type 2 diabetes mellitus with unspecified complications: Secondary | ICD-10-CM | POA: Diagnosis present

## 2011-10-25 DIAGNOSIS — E785 Hyperlipidemia, unspecified: Secondary | ICD-10-CM

## 2011-10-25 DIAGNOSIS — E1165 Type 2 diabetes mellitus with hyperglycemia: Secondary | ICD-10-CM | POA: Diagnosis present

## 2011-10-25 DIAGNOSIS — L0390 Cellulitis, unspecified: Secondary | ICD-10-CM | POA: Diagnosis present

## 2011-10-25 DIAGNOSIS — R011 Cardiac murmur, unspecified: Secondary | ICD-10-CM

## 2011-10-25 DIAGNOSIS — I10 Essential (primary) hypertension: Secondary | ICD-10-CM

## 2011-10-25 DIAGNOSIS — E1159 Type 2 diabetes mellitus with other circulatory complications: Secondary | ICD-10-CM | POA: Diagnosis present

## 2011-10-25 DIAGNOSIS — I2510 Atherosclerotic heart disease of native coronary artery without angina pectoris: Secondary | ICD-10-CM

## 2011-10-25 DIAGNOSIS — I252 Old myocardial infarction: Secondary | ICD-10-CM

## 2011-10-25 DIAGNOSIS — IMO0002 Reserved for concepts with insufficient information to code with codable children: Secondary | ICD-10-CM | POA: Diagnosis present

## 2011-10-25 DIAGNOSIS — F17200 Nicotine dependence, unspecified, uncomplicated: Secondary | ICD-10-CM | POA: Diagnosis present

## 2011-10-25 DIAGNOSIS — E782 Mixed hyperlipidemia: Secondary | ICD-10-CM | POA: Diagnosis present

## 2011-10-25 DIAGNOSIS — L02214 Cutaneous abscess of groin: Secondary | ICD-10-CM

## 2011-10-25 DIAGNOSIS — Z9861 Coronary angioplasty status: Secondary | ICD-10-CM

## 2011-10-25 DIAGNOSIS — R079 Chest pain, unspecified: Principal | ICD-10-CM

## 2011-10-25 DIAGNOSIS — L02219 Cutaneous abscess of trunk, unspecified: Secondary | ICD-10-CM | POA: Diagnosis present

## 2011-10-25 DIAGNOSIS — L0291 Cutaneous abscess, unspecified: Secondary | ICD-10-CM | POA: Diagnosis present

## 2011-10-25 DIAGNOSIS — E8881 Metabolic syndrome: Secondary | ICD-10-CM | POA: Diagnosis present

## 2011-10-25 DIAGNOSIS — Z9119 Patient's noncompliance with other medical treatment and regimen: Secondary | ICD-10-CM

## 2011-10-25 DIAGNOSIS — Z7982 Long term (current) use of aspirin: Secondary | ICD-10-CM

## 2011-10-25 DIAGNOSIS — Z91199 Patient's noncompliance with other medical treatment and regimen due to unspecified reason: Secondary | ICD-10-CM

## 2011-10-25 DIAGNOSIS — E1169 Type 2 diabetes mellitus with other specified complication: Secondary | ICD-10-CM | POA: Diagnosis present

## 2011-10-25 DIAGNOSIS — E119 Type 2 diabetes mellitus without complications: Secondary | ICD-10-CM

## 2011-10-25 DIAGNOSIS — B951 Streptococcus, group B, as the cause of diseases classified elsewhere: Secondary | ICD-10-CM | POA: Diagnosis present

## 2011-10-25 HISTORY — DX: Gastro-esophageal reflux disease without esophagitis: K21.9

## 2011-10-25 HISTORY — DX: Essential (primary) hypertension: I10

## 2011-10-25 HISTORY — DX: Atherosclerotic heart disease of native coronary artery without angina pectoris: I25.10

## 2011-10-25 HISTORY — DX: Unspecified osteoarthritis, unspecified site: M19.90

## 2011-10-25 LAB — URINALYSIS, ROUTINE W REFLEX MICROSCOPIC
Bilirubin Urine: NEGATIVE
Glucose, UA: >1000 mg/dL — AB
Hgb urine dipstick: NEGATIVE
Ketones, ur: 40 mg/dL — AB
Leukocytes, UA: NEGATIVE
Nitrite: NEGATIVE
Protein, ur: NEGATIVE mg/dL
Specific Gravity, Urine: 1.015 (ref 1.005–1.030)
Urobilinogen, UA: 0.2 mg/dL (ref 0.0–1.0)
pH: 5.5 (ref 5.0–8.0)

## 2011-10-25 LAB — COMPREHENSIVE METABOLIC PANEL
ALT: 5 U/L (ref 0–53)
AST: 5 U/L (ref 0–37)
Albumin: 3.4 g/dL — ABNORMAL LOW (ref 3.5–5.2)
Alkaline Phosphatase: 85 U/L (ref 39–117)
BUN: 21 mg/dL (ref 6–23)
CO2: 20 mEq/L (ref 19–32)
Calcium: 9.4 mg/dL (ref 8.4–10.5)
Chloride: 92 mEq/L — ABNORMAL LOW (ref 96–112)
Creatinine, Ser: 0.81 mg/dL (ref 0.50–1.35)
GFR calc Af Amer: >90 mL/min (ref >90)
GFR calc non Af Amer: >90 mL/min (ref >90)
Glucose, Bld: 395 mg/dL — ABNORMAL HIGH (ref 70–99)
Potassium: 3.9 mEq/L (ref 3.5–5.1)
Sodium: 128 mEq/L — ABNORMAL LOW (ref 135–145)
Total Bilirubin: 0.4 mg/dL (ref 0.3–1.2)
Total Protein: 6.8 g/dL (ref 6.0–8.3)

## 2011-10-25 LAB — CBC
HCT: 42.4 % (ref 39.0–52.0)
Hemoglobin: 15.6 g/dL (ref 13.0–17.0)
MCH: 31 pg (ref 26.0–34.0)
MCHC: 36.8 g/dL — ABNORMAL HIGH (ref 30.0–36.0)
MCV: 84.1 fL (ref 78.0–100.0)
Platelets: 224 10*3/uL (ref 150–400)
RBC: 5.04 MIL/uL (ref 4.22–5.81)
RDW: 12.9 % (ref 11.5–15.5)
WBC: 13.6 10*3/uL — ABNORMAL HIGH (ref 4.0–10.5)

## 2011-10-25 LAB — URINE MICROSCOPIC-ADD ON

## 2011-10-25 LAB — GLUCOSE, CAPILLARY: Glucose-Capillary: 383 mg/dL — ABNORMAL HIGH (ref 70–99)

## 2011-10-25 LAB — PROTIME-INR
INR: 0.99 (ref 0.00–1.49)
Prothrombin Time: 13.3 seconds (ref 11.6–15.2)

## 2011-10-25 LAB — POCT I-STAT TROPONIN I: Troponin i, poc: 0 ng/mL (ref 0.00–0.08)

## 2011-10-25 LAB — APTT: aPTT: 38 seconds — ABNORMAL HIGH (ref 24–37)

## 2011-10-25 MED ORDER — SODIUM CHLORIDE 0.9 % IV SOLN
1000.0000 mL | Freq: Once | INTRAVENOUS | Status: AC
  Administered 2011-10-25: 1000 mL via INTRAVENOUS

## 2011-10-25 MED ORDER — ASPIRIN 81 MG PO CHEW: 324.0000 mg | CHEWABLE_TABLET | Freq: Once | ORAL | Status: DC

## 2011-10-25 MED ORDER — SODIUM CHLORIDE 0.9 % IV SOLN
1000.0000 mL | INTRAVENOUS | Status: DC
  Administered 2011-10-25: 1000 mL via INTRAVENOUS

## 2011-10-25 MED ORDER — INSULIN ASPART 100 UNIT/ML ~~LOC~~ SOLN
10.0000 [IU] | Freq: Once | SUBCUTANEOUS | Status: AC
  Administered 2011-10-26: 10 [IU] via INTRAVENOUS
  Filled 2011-10-25: qty 1

## 2011-10-25 MED ORDER — SODIUM CHLORIDE 0.9 % IV SOLN: 1000.0000 mL | INTRAVENOUS | Status: DC

## 2011-10-25 MED ORDER — NITROGLYCERIN 0.4 MG SL SUBL: 0.4000 mg | SUBLINGUAL_TABLET | SUBLINGUAL | Status: DC | PRN

## 2011-10-25 MED ORDER — LIDOCAINE HCL 2 % IJ SOLN
10.0000 mL | Freq: Once | INTRAMUSCULAR | Status: AC
  Administered 2011-10-25: 200 mg via INTRADERMAL

## 2011-10-25 MED ORDER — LIDOCAINE HCL (PF) 1 % IJ SOLN
INTRAMUSCULAR | Status: AC
  Filled 2011-10-25: qty 5

## 2011-10-25 NOTE — ED Provider Notes (Cosign Needed)
History     CSN: [id number]  Arrival date & time 10/25/11  2015   First MD Initiated Contact with Patient 10/25/11 2024      Chief Complaint  Patient presents with  . Chest Pain    (Consider location/radiation/quality/duration/timing/severity/associated sxs/prior treatment) HPI Comments: The patient is a 61 year old man who had onset of chest pain around 1 AM. He would have sharp pains in the left chest that subsided dull aching pains. The pain would last about 10 minutes go away. Had some nausea but no vomiting. Ultimately he decided to come to the hospital. He took 4 baby aspirin here. He rates the pain as a 9/10 on the pain is sharp at a baseline is a pain of 45. He has previously had coronary artery stenting in 2010 by Dr. Dickie La. He has been followed subsequently by Dr. Charlton Haws of [facility] cardiology.  It is noteworthy that he is diabetic but hasn't been on medications for this for a couple of months. Finally, he has noted a painful area in the right groin with redness and swelling.  Patient is a 61 y.o. male presenting with chest pain. The history is provided by the patient.  Chest Pain The chest pain began 12 - 24 hours ago. Duration of episode(s) is 10 minutes. The chest pain is unchanged (He has had stuttering episodes of chest pain since about 1 AM.). At its most intense, the pain is at 10/10. The pain is currently at 4/10. The severity of the pain is moderate. Quality:   when  the pain is intense it  iss sharp and it goes doown to a dull pain. The pain does not radiate (Pain is felt in the precordial region with slight radiation to the left anterior chest.). Exacerbated by: Nothing. He tried aspirin for the symptoms. Risk factors: Prior cardiac stenting. Diabetic.  His past medical history is significant for CAD and diabetes.  Procedure history is positive for cardiac catheterization.     Past Medical History  Diagnosis Date  . Diabetes mellitus     Past Surgical History    Procedure Date  . Coronary stent placement     History reviewed. No pertinent family history.  History  Substance Use Topics  . Smoking status: Not on file  . Smokeless tobacco: Not on file  . Alcohol Use:       Review of Systems  Constitutional: Negative.   HENT: Negative.   Eyes: Negative.   Respiratory: Negative.   Cardiovascular: Positive for chest pain.  Gastrointestinal: Negative.   Genitourinary: Negative.   Musculoskeletal: Negative.   Skin:       Abscess right groin   Neurological: Negative.   Psychiatric/Behavioral: Negative.     Allergies  Horse-derived products  Home Medications   Current Outpatient Rx  Name Route Sig Dispense Refill  . ASPIRIN 81 MG PO CHEW Oral Chew 324 mg by mouth once.    . ASPIRIN EC 81 MG PO TBEC Oral Take 81 mg by mouth daily.      BP 130/94  Pulse 112  Temp(Src) 97.9 F (36.6 C) (Oral)  Resp 18  SpO2 97%  Physical Exam  Nursing note and vitals reviewed. Constitutional: He is oriented to person, place, and time. He appears well-developed and well-nourished. Distressed: in mild distress complaining of chest pain and also of a painful area of the right groin.  HENT:  Head: Normocephalic and atraumatic.  Right Ear: External ear normal.  Left Ear: External ear normal.  Mouth/Throat: Oropharynx is clear and moist.  Eyes: Conjunctivae and EOM are normal. Pupils are equal, round, and reactive to light.  Neck: Normal range of motion. Neck supple.  Cardiovascular: Normal rate, regular rhythm and normal heart sounds.   Pulmonary/Chest: Effort normal and breath sounds normal.  Abdominal: Soft. Bowel sounds are normal.  Musculoskeletal: Normal range of motion. He exhibits no edema and no tenderness.  Neurological: He is alert and oriented to person, place, and time.       No sensory or motor deficit.  Skin:       He has a 3 cm area of cellulitis with a central abscessed area. This is located in the right inguinal region just  above the inguinal crease. There is no associated hernia.  Psychiatric: He has a normal mood and affect. His behavior is normal.    ED Course  INCISION AND DRAINAGE Date/Time: 10/25/2011 11:37 PM Performed by: Osvaldo Human Authorized by: Osvaldo Human Consent: Verbal consent obtained. Risks and benefits: risks, benefits and alternatives were discussed Consent given by: patient Patient understanding: patient states understanding of the procedure being performed Patient consent: the patient's understanding of the procedure matches consent given Patient identity confirmed: verbally with patient Time out: Immediately prior to procedure a "time out" was called to verify the correct patient, procedure, equipment, support staff and site/side marked as required. Type: abscess Body area: trunk Location details: abdomen Anesthesia: local infiltration Local anesthetic: lidocaine 2% without epinephrine Patient sedated: no Scalpel size: 11 Incision type: single straight Complexity: simple Drainage: purulent Drainage amount: moderate Wound treatment: drain placed Packing material: 1/4 in iodoform gauze Patient tolerance: Patient tolerated the procedure well with no immediate complications. Comments: C&S of pus was obtained.   (including critical care time)  8:41 PM  Date: 10/25/2011  Rate: 105  Rhythm: sinus tachycardia  QRS Axis: right  Intervals: normal QRS:  Poor R wave progression in precordial leads suggests old anterior myocardial infarction.   ST/T Wave abnormalities: nonspecific T wave changes  Conduction Disutrbances:none  Narrative Interpretation: Abnormal EKG.   Old EKG Reviewed: changes noted--had sinus bradycardia on tracing of 08/10/2008.  8:43 PM Pt was seen and had physical examination was performed.  EKG was nonacute.  Lab tests were ordered.  Setup for I&D of his groin abscess was ordered.    11:55 PM I contacted the [facility] cardiology, Dr. Maryelizabeth Kaufmann, who  discussed the case with him. He advised admitting the patient to internal medicine, as his chest pain had not produced a heart attack in early 24 hours. Also he has uncontrolled diabetes have an abscess in the right groin. He would be glad to consult. Also I contacted Dr. Onalee Hua, hospitalist, who accepts the patient for admission. We will admit the patient to a telemetry bed, [facility] Team 9, Dr. Lavera Guise.  1. Chest pain   2. Diabetes mellitus   3. Abscess of right groin          Carleene Cooper III, MD 10/27/11 [phone]

## 2011-10-25 NOTE — ED Notes (Signed)
Pt states that he is having 5/10 CP. Pain is dull and when he presses on the center of his chest the pain gets better, then when he relieves pressure his pain comes back. Pt states he has had pain like this in the past, before his stent but not with his stent. Pt alert and oriented.

## 2011-10-25 NOTE — ED Notes (Signed)
EDP davidson at bedside to do I & D or groin area abscess.

## 2011-10-25 NOTE — H&P (Signed)
PCP:   Josue Hector, MD, MD   Chief Complaint:  Chest pain  HPI: 61 year old male with a history of diabetes he stopped taking his medication months ago, and coronary artery disease status post stenting, hyperlipidemia, hypertension who presents emergency Department with approximately 24 hours of substernal chest pain has been waxing and waning and not associated with shortness of breath, nausea, vomiting or radiation. He also has abscess that's been in his right groin area for the last day or so has been draining pus. This has been drained by the emergency room staff and packed. He denies any fevers. He denies any lower extremity pain or swelling. He denies any rashes on his body.  Review of Systems:  Otherwise negative  Past Medical History: Past Medical History  Diagnosis Date  . Diabetes mellitus    Past Surgical History  Procedure Date  . Coronary stent placement     Medications: Prior to Admission medications   Medication Sig Start Date End Date Taking? Authorizing Provider  aspirin 81 MG chewable tablet Chew 324 mg by mouth once.   Yes Historical Provider, MD  aspirin EC 81 MG tablet Take 81 mg by mouth daily.   Yes Historical Provider, MD    Allergies:   Allergies  Allergen Reactions  . Horse-Derived Products     REACTION: Rash    Social History:  does not have a smoking history on file. He does not have any smokeless tobacco history on file. His alcohol and drug histories not on file.  Family History: History reviewed. No pertinent family history.  Physical Exam: Filed Vitals:   10/25/11 2027 10/25/11 2343  BP: 130/94 131/83  Pulse: 112 81  Temp: 97.9 F (36.6 C)   TempSrc: Oral   Resp: 18 18  SpO2: 97% 98%   General appearance: alert, cooperative and no distress Lungs: clear to auscultation bilaterally Heart: regular rate and rhythm, S1, S2 normal, no murmur, click, rub or gallop Abdomen: soft, non-tender; bowel sounds normal; no masses,   no organomegaly Extremities: extremities normal, atraumatic, no cyanosis or edema Pulses: 2+ and symmetric Skin: Skin color, texture, turgor normal. No rashes or lesions or rt groin area with approx 4cm area of cellultis with absess s/p i&d with packing material Neurologic: Grossly normal    Labs on Admission:   [facility] 10/25/11 2050  NA 128*  K 3.9  CL 92*  CO2 20  GLUCOSE 395*  BUN 21  CREATININE 0.81  CALCIUM 9.4  MG --  PHOS --    Basename 10/25/11 2050  AST 5  ALT 5  ALKPHOS 85  BILITOT 0.4  PROT 6.8  ALBUMIN 3.4*    Basename 10/25/11 2050  WBC 13.6*  NEUTROABS --  HGB 15.6  HCT 42.4  MCV 84.1  PLT 224   Radiological Exams on Admission: Dg Chest Portable 1 View  10/25/2011  *RADIOLOGY REPORT*  Clinical Data: Chest pain  PORTABLE CHEST - 1 VIEW  Comparison: 09/13/2005  Findings: The apices are obscured by overlying structures.  No focal consolidation.  No pleural effusion or pneumothorax. Cardiomediastinal contours are mildly prominent to upper normal, similar to prior.  No acute osseous finding visualized.  IMPRESSION: Cardiomediastinal contours upper normal limits on prominent however similar to prior.  No definite radiographic evidence of acute cardiopulmonary process.  Original Report Authenticated By: Waneta Martins, M.D.    Assessment/Plan Present on Admission:  61 year old male with multiple risk factors who has chest pain in the right groin abscess  .  CHEST PAIN-UNSPECIFIED .Diabetes mellitus .Cellulitis and abscess rt groin .Essential hypertension, benign .[facility]  Cardiology has been consult to see the patient in the morning. Serial This patient's cardiac enzymes and place on telemetry. Place nitroglycerin paste. Baby aspirin. Place on sliding-scale insulin for his uncontrolled diabetes. Place on doxycycline for this abscess culture has been done by the emergency room staff. We'll keep n.p.o. after midnight in case cardiology wishes  to proceed with cath. Dose Lovenox will need to take subcutaneous every 12 and low-dose beta blocker.  Sindia Kowalczyk A [phone] 10/25/2011, 11:56 PM

## 2011-10-25 NOTE — ED Notes (Signed)
Per EMS: pt started having CP last night at 00:00. Pt states that the pain was dull center to left chest and when he pressed on center chest pain decreased. Pt states that he had some numbness in left arm yesterday. Today while at a friends house he started having CP again. Pt also has not been taking his daily medicines for 2 months. CBG 484 upon EMS arrival and then after 800 NS bolus he went down to 365. Pt took 324mg  of aspirin with EMS. Pt pain went from a 4/10 pain to a 2/10 pain.

## 2011-10-26 ENCOUNTER — Encounter (HOSPITAL_COMMUNITY): Payer: Self-pay | Admitting: *Deleted

## 2011-10-26 DIAGNOSIS — IMO0001 Reserved for inherently not codable concepts without codable children: Secondary | ICD-10-CM

## 2011-10-26 DIAGNOSIS — I2510 Atherosclerotic heart disease of native coronary artery without angina pectoris: Secondary | ICD-10-CM

## 2011-10-26 DIAGNOSIS — L0291 Cutaneous abscess, unspecified: Secondary | ICD-10-CM

## 2011-10-26 DIAGNOSIS — R079 Chest pain, unspecified: Principal | ICD-10-CM

## 2011-10-26 DIAGNOSIS — R072 Precordial pain: Secondary | ICD-10-CM

## 2011-10-26 DIAGNOSIS — L0390 Cellulitis, unspecified: Secondary | ICD-10-CM

## 2011-10-26 DIAGNOSIS — E1165 Type 2 diabetes mellitus with hyperglycemia: Secondary | ICD-10-CM

## 2011-10-26 LAB — BASIC METABOLIC PANEL
BUN: 16 mg/dL (ref 6–23)
CO2: 20 mEq/L (ref 19–32)
Calcium: 9 mg/dL (ref 8.4–10.5)
Chloride: 95 mEq/L — ABNORMAL LOW (ref 96–112)
Creatinine, Ser: 0.71 mg/dL (ref 0.50–1.35)
GFR calc Af Amer: >90 mL/min (ref >90)
GFR calc non Af Amer: >90 mL/min (ref >90)
Glucose, Bld: 271 mg/dL — ABNORMAL HIGH (ref 70–99)
Potassium: 3.4 mEq/L — ABNORMAL LOW (ref 3.5–5.1)
Sodium: 131 mEq/L — ABNORMAL LOW (ref 135–145)

## 2011-10-26 LAB — CARDIAC PANEL(CRET KIN+CKTOT+MB+TROPI)
CK, MB: 2.4 ng/mL (ref 0.3–4.0)
CK, MB: 1.8 ng/mL (ref 0.3–4.0)
CK, MB: 2 ng/mL (ref 0.3–4.0)
Relative Index: INVALID (ref 0.0–2.5)
Relative Index: INVALID (ref 0.0–2.5)
Relative Index: INVALID (ref 0.0–2.5)
Total CK: 59 U/L (ref 7–232)
Total CK: 52 U/L (ref 7–232)
Total CK: 69 U/L (ref 7–232)
Troponin I: <0.3 ng/mL (ref <0.30)
Troponin I: <0.3 ng/mL (ref <0.30)
Troponin I: <0.3 ng/mL (ref <0.30)

## 2011-10-26 LAB — POCT I-STAT TROPONIN I: Troponin i, poc: 0.01 ng/mL (ref 0.00–0.08)

## 2011-10-26 LAB — CBC
HCT: 40.6 % (ref 39.0–52.0)
Hemoglobin: 14.5 g/dL (ref 13.0–17.0)
MCH: 30.1 pg (ref 26.0–34.0)
MCHC: 35.7 g/dL (ref 30.0–36.0)
MCV: 84.2 fL (ref 78.0–100.0)
Platelets: 209 10*3/uL (ref 150–400)
RBC: 4.82 MIL/uL (ref 4.22–5.81)
RDW: 12.8 % (ref 11.5–15.5)
WBC: 11.4 10*3/uL — ABNORMAL HIGH (ref 4.0–10.5)

## 2011-10-26 LAB — GLUCOSE, CAPILLARY
Glucose-Capillary: 256 mg/dL — ABNORMAL HIGH (ref 70–99)
Glucose-Capillary: 253 mg/dL — ABNORMAL HIGH (ref 70–99)
Glucose-Capillary: 260 mg/dL — ABNORMAL HIGH (ref 70–99)
Glucose-Capillary: 252 mg/dL — ABNORMAL HIGH (ref 70–99)
Glucose-Capillary: 259 mg/dL — ABNORMAL HIGH (ref 70–99)
Glucose-Capillary: 315 mg/dL — ABNORMAL HIGH (ref 70–99)

## 2011-10-26 LAB — HEMOGLOBIN A1C
Hgb A1c MFr Bld: 14.3 % — ABNORMAL HIGH (ref <5.7)
Mean Plasma Glucose: 364 mg/dL — ABNORMAL HIGH (ref <117)

## 2011-10-26 MED ORDER — SODIUM CHLORIDE 0.9 % IV SOLN
INTRAVENOUS | Status: AC
  Administered 2011-10-26: 03:00:00 via INTRAVENOUS

## 2011-10-26 MED ORDER — ASPIRIN 81 MG PO CHEW: 324.0000 mg | CHEWABLE_TABLET | Freq: Once | ORAL | Status: DC

## 2011-10-26 MED ORDER — MORPHINE SULFATE 2 MG/ML IJ SOLN
1.0000 mg | INTRAMUSCULAR | Status: DC | PRN
  Administered 2011-10-26 – 2011-10-27 (×4): 1 mg via INTRAVENOUS
  Filled 2011-10-26 (×4): qty 1

## 2011-10-26 MED ORDER — SODIUM CHLORIDE 0.9 % IV SOLN: INTRAVENOUS | Status: DC

## 2011-10-26 MED ORDER — PANTOPRAZOLE SODIUM 40 MG IV SOLR
40.0000 mg | Freq: Two times a day (BID) | INTRAVENOUS | Status: DC
  Administered 2011-10-26 – 2011-10-27 (×3): 40 mg via INTRAVENOUS
  Filled 2011-10-26 (×4): qty 40

## 2011-10-26 MED ORDER — POTASSIUM CHLORIDE CRYS ER 20 MEQ PO TBCR
20.0000 meq | EXTENDED_RELEASE_TABLET | Freq: Two times a day (BID) | ORAL | Status: AC
  Administered 2011-10-26 (×2): 20 meq via ORAL
  Filled 2011-10-26 (×2): qty 1

## 2011-10-26 MED ORDER — DOXYCYCLINE HYCLATE 100 MG PO TABS
100.0000 mg | ORAL_TABLET | Freq: Two times a day (BID) | ORAL | Status: DC
  Administered 2011-10-26: 100 mg via ORAL
  Filled 2011-10-26: qty 1

## 2011-10-26 MED ORDER — ONDANSETRON HCL 4 MG PO TABS: 4.0000 mg | ORAL_TABLET | Freq: Four times a day (QID) | ORAL | Status: DC | PRN

## 2011-10-26 MED ORDER — VANCOMYCIN HCL 1000 MG IV SOLR
1500.0000 mg | Freq: Two times a day (BID) | INTRAVENOUS | Status: DC
  Administered 2011-10-27 – 2011-10-28 (×4): 1500 mg via INTRAVENOUS
  Filled 2011-10-26 (×4): qty 1500

## 2011-10-26 MED ORDER — ALUM & MAG HYDROXIDE-SIMETH 200-200-20 MG/5ML PO SUSP
30.0000 mL | Freq: Four times a day (QID) | ORAL | Status: DC | PRN
  Administered 2011-10-26: 30 mL via ORAL
  Filled 2011-10-26: qty 30

## 2011-10-26 MED ORDER — LIVING WELL WITH DIABETES BOOK
Freq: Once | Status: AC
  Administered 2011-10-26: 12:00:00
  Filled 2011-10-26: qty 1

## 2011-10-26 MED ORDER — SODIUM CHLORIDE 0.9 % IJ SOLN
3.0000 mL | Freq: Two times a day (BID) | INTRAMUSCULAR | Status: DC
  Administered 2011-10-26 – 2011-10-29 (×6): 3 mL via INTRAVENOUS

## 2011-10-26 MED ORDER — NITROGLYCERIN 2 % TD OINT
0.5000 [in_us] | TOPICAL_OINTMENT | Freq: Three times a day (TID) | TRANSDERMAL | Status: DC
  Administered 2011-10-26: 0.5 [in_us] via TOPICAL
  Filled 2011-10-26: qty 30

## 2011-10-26 MED ORDER — ONDANSETRON HCL 4 MG/2ML IJ SOLN: 4.0000 mg | Freq: Four times a day (QID) | INTRAMUSCULAR | Status: DC | PRN

## 2011-10-26 MED ORDER — ENOXAPARIN SODIUM 120 MG/0.8ML ~~LOC~~ SOLN
110.0000 mg | Freq: Two times a day (BID) | SUBCUTANEOUS | Status: DC
  Filled 2011-10-26 (×2): qty 0.8

## 2011-10-26 MED ORDER — ACETAMINOPHEN 325 MG PO TABS
650.0000 mg | ORAL_TABLET | Freq: Four times a day (QID) | ORAL | Status: DC | PRN
  Administered 2011-10-26: 650 mg via ORAL
  Filled 2011-10-26: qty 2

## 2011-10-26 MED ORDER — REGADENOSON 0.4 MG/5ML IV SOLN
0.4000 mg | Freq: Once | INTRAVENOUS | Status: AC
  Administered 2011-10-27: 0.4 mg via INTRAVENOUS
  Filled 2011-10-26: qty 5

## 2011-10-26 MED ORDER — ENOXAPARIN SODIUM 150 MG/ML ~~LOC~~ SOLN: 130.0000 mg | Freq: Two times a day (BID) | SUBCUTANEOUS | Status: DC

## 2011-10-26 MED ORDER — DOXYCYCLINE HYCLATE 100 MG PO TABS
100.0000 mg | ORAL_TABLET | Freq: Two times a day (BID) | ORAL | Status: DC
  Administered 2011-10-26: 100 mg via ORAL
  Filled 2011-10-26 (×2): qty 1

## 2011-10-26 MED ORDER — INSULIN ASPART 100 UNIT/ML ~~LOC~~ SOLN
0.0000 [IU] | SUBCUTANEOUS | Status: DC
  Administered 2011-10-26 (×2): 5 [IU] via SUBCUTANEOUS
  Administered 2011-10-26: 7 [IU] via SUBCUTANEOUS
  Administered 2011-10-26 – 2011-10-27 (×4): 5 [IU] via SUBCUTANEOUS

## 2011-10-26 MED ORDER — METOPROLOL SUCCINATE ER 25 MG PO TB24
25.0000 mg | ORAL_TABLET | Freq: Every day | ORAL | Status: DC
  Administered 2011-10-26 – 2011-10-28 (×3): 25 mg via ORAL
  Filled 2011-10-26 (×4): qty 1

## 2011-10-26 MED ORDER — SUCRALFATE 1 G PO TABS
1.0000 g | ORAL_TABLET | Freq: Three times a day (TID) | ORAL | Status: DC
  Administered 2011-10-26 – 2011-10-29 (×10): 1 g via ORAL
  Filled 2011-10-26 (×15): qty 1

## 2011-10-26 MED ORDER — ENOXAPARIN SODIUM 120 MG/0.8ML ~~LOC~~ SOLN
110.0000 mg | SUBCUTANEOUS | Status: AC
  Administered 2011-10-26: 110 mg via SUBCUTANEOUS
  Filled 2011-10-26: qty 0.8

## 2011-10-26 MED ORDER — VANCOMYCIN HCL 1000 MG IV SOLR
2000.0000 mg | Freq: Once | INTRAVENOUS | Status: AC
  Administered 2011-10-26: 2000 mg via INTRAVENOUS
  Filled 2011-10-26: qty 2000

## 2011-10-26 MED ORDER — ENOXAPARIN SODIUM 40 MG/0.4ML ~~LOC~~ SOLN
40.0000 mg | SUBCUTANEOUS | Status: DC
  Administered 2011-10-26 – 2011-10-28 (×3): 40 mg via SUBCUTANEOUS
  Filled 2011-10-26 (×4): qty 0.4

## 2011-10-26 NOTE — Progress Notes (Signed)
UR Completed Alwyn Cordner Graves-Bigelow, RN,BSN [phone]  

## 2011-10-26 NOTE — Care Management Note (Addendum)
    Page 1 of 2   10/29/2011     10:36:13 AM   CARE MANAGEMENT NOTE 10/29/2011  Patient:  [name] [name], [name] [name]   Account Number:  [id number]  Date Initiated:  10/26/2011  Documentation initiated by:  [name],[name]  Subjective/Objective Assessment:   Pt admitted with cp. Hx DM uncontrolled and has [name] R groin abcess. Plan for Iv antibiotics. Diabetes Educator was consulted and is in the room with pt at this time.     Action/Plan:   CM will f/u for disposiiton needs.   Anticipated DC Date:  10/28/2011   Anticipated DC Plan:  HOME W HOME HEALTH SERVICES  In-house referral  Financial Counselor      DC Planning Services  CM consult      Choice offered to / List presented to:  C-1 Patient        HH arranged  HH-1 RN      [facility] [name] agency  [facility]   Status of service:  Completed, signed off Medicare Important Message given?   (If response is "NO", the following Medicare IM given date fields will be blank) Date Medicare IM given:   Date Additional Medicare IM given:    Discharge Disposition:  HOME/SELF CARE  Per UR Regulation:  Reviewed for med. necessity/level of care/duration of stay  If discussed at Long Length of Stay Meetings, dates discussed:    Comments:  10/29/11 10:33 Letha Cape RN, BSN [phone] patient is for dc to home with hh services [facility] for wound care,patient would like to go with [name] [name] [name] [name] for wound care. Referral made to [name] [name] , Soc will begin 24-48 hrs post discharge.  10-28-11 1004 [name] Graves-Bigelow,RN,BSN Pt admitted with cp and has no insurance. CM called [place] [facility] to see if could make [name] f/u apt. CM made apt for Tuesday June 18 at 3:00 pm with NP [place]. Pt is to bring d/c summary from [name] visist, proof of income and list of meds. He will also see Cheri Rous for asistance with medications. CM did contact [facility] and they will assist with bill and to see if pt is eligible for medicaid. CM did discuss with pt about medicaitons  and purchasing them from [facility] pharmacy due to cost. No further needs from CM at this time.

## 2011-10-26 NOTE — Progress Notes (Signed)
  Echocardiogram 2D Echocardiogram has been performed.  [name] [name] [facility] 10/26/2011, 10:39 AM

## 2011-10-26 NOTE — Consult Note (Signed)
WOC consult Note Reason for Consult: Wound to R groin s/p I&D of abscess Wound type: partial-thickness surgical incision  Measurement: 1cm x 0.1cm x 1.25 cm Wound bed: 100% granulation Drainage (amount, consistency, odor): mod amt sanguineous drainage  Periwound: surrounding 2-3cm is erythematous, indurated, extremely tender to pt Dressing procedure/placement/frequency: wound packed with saline moistened gauze and covered with 2x2 and tape. Gauze packing strip ordered. Will require daily dressing change.   Pt tol procedure poorly - c/o "I'm going to pass out" d/t pain. Recommend pharmacological pain mgmt prior to dressing changes in the future.  Vesta Mixer RN BSN, wound care student Will not plan to follow further unless re-consulted.  [address] RN, MSN, [employer]  [phone]

## 2011-10-26 NOTE — Progress Notes (Addendum)
Subjective: Chest pain at rest None today  Objective: Vital signs in last 24 hours: Filed Vitals:   10/26/11 0030 10/26/11 0130 10/26/11 0215 10/26/11 0500  BP: 142/78 145/71 137/80 124/70  Pulse: 88 87 81 84  Temp:   98.9 F (37.2 C) 98.2 F (36.8 C)  TempSrc:   Oral Oral  Resp: 20 19 18 18   Height:   6\' 2"  (1.88 m)   Weight:   111.6 kg (246 lb 0.5 oz)   SpO2: 96% 99% 98% 100%    Intake/Output Summary (Last 24 hours) at 10/26/11 1200 Last data filed at 10/26/11 0900  Gross per 24 hour  Intake      0 ml  Output    920 ml  Net   -920 ml    Weight change:  General appearance: alert, cooperative and no distress  Lungs: clear to auscultation bilaterally  Heart: regular rate and rhythm, S1, S2 normal, no murmur, click, rub or gallop  Abdomen: soft, non-tender; bowel sounds normal; no masses, no organomegaly  Extremities: extremities normal, atraumatic, no cyanosis or edema  Pulses: 2+ and symmetric  Skin: Skin color, texture, turgor normal. No rashes or lesions or rt groin area with approx 4cm area of cellultis with absess s/p i&d with packing material  Neurologic: Grossly normal    Lab Results: Results for orders placed during the hospital encounter of 10/25/11 (from the past 24 hour(s))  CBC     Status: Abnormal   Collection Time   10/25/11  8:50 PM      Component Value Range   WBC 13.6 (*) 4.0 - 10.5 (K/uL)   RBC 5.04  4.22 - 5.81 (MIL/uL)   Hemoglobin 15.6  13.0 - 17.0 (g/dL)   HCT 16.1  09.6 - 04.5 (%)   MCV 84.1  78.0 - 100.0 (fL)   MCH 31.0  26.0 - 34.0 (pg)   MCHC 36.8 (*) 30.0 - 36.0 (g/dL)   RDW 40.9  81.1 - 91.4 (%)   Platelets 224  150 - 400 (K/uL)  COMPREHENSIVE METABOLIC PANEL     Status: Abnormal   Collection Time   10/25/11  8:50 PM      Component Value Range   Sodium 128 (*) 135 - 145 (mEq/L)   Potassium 3.9  3.5 - 5.1 (mEq/L)   Chloride 92 (*) 96 - 112 (mEq/L)   CO2 20  19 - 32 (mEq/L)   Glucose, Bld 395 (*) 70 - 99 (mg/dL)   BUN 21  6 - 23  (mg/dL)   Creatinine, Ser 7.82  0.50 - 1.35 (mg/dL)   Calcium 9.4  8.4 - 95.6 (mg/dL)   Total Protein 6.8  6.0 - 8.3 (g/dL)   Albumin 3.4 (*) 3.5 - 5.2 (g/dL)   AST 5  0 - 37 (U/L)   ALT 5  0 - 53 (U/L)   Alkaline Phosphatase 85  39 - 117 (U/L)   Total Bilirubin 0.4  0.3 - 1.2 (mg/dL)   GFR calc non Af Amer >90  >90 (mL/min)   GFR calc Af Amer >90  >90 (mL/min)  PROTIME-INR     Status: Normal   Collection Time   10/25/11  8:50 PM      Component Value Range   Prothrombin Time 13.3  11.6 - 15.2 (seconds)   INR 0.99  0.00 - 1.49   APTT     Status: Abnormal   Collection Time   10/25/11  8:50 PM  Component Value Range   aPTT 38 (*) 24 - 37 (seconds)  GLUCOSE, CAPILLARY     Status: Abnormal   Collection Time   10/25/11  8:58 PM      Component Value Range   Glucose-Capillary 383 (*) 70 - 99 (mg/dL)   Comment 1 Notify RN     Comment 2 Documented in Chart    POCT I-STAT TROPONIN I     Status: Normal   Collection Time   10/25/11  9:03 PM      Component Value Range   Troponin i, poc 0.00  0.00 - 0.08 (ng/mL)   Comment 3           URINALYSIS, ROUTINE W REFLEX MICROSCOPIC     Status: Abnormal   Collection Time   10/25/11  9:28 PM      Component Value Range   Color, Urine YELLOW  YELLOW    APPearance CLEAR  CLEAR    Specific Gravity, Urine 1.015  1.005 - 1.030    pH 5.5  5.0 - 8.0    Glucose, UA >1000 (*) NEGATIVE (mg/dL)   Hgb urine dipstick NEGATIVE  NEGATIVE    Bilirubin Urine NEGATIVE  NEGATIVE    Ketones, ur 40 (*) NEGATIVE (mg/dL)   Protein, ur NEGATIVE  NEGATIVE (mg/dL)   Urobilinogen, UA 0.2  0.0 - 1.0 (mg/dL)   Nitrite NEGATIVE  NEGATIVE    Leukocytes, UA NEGATIVE  NEGATIVE   URINE MICROSCOPIC-ADD ON     Status: Abnormal   Collection Time   10/25/11  9:28 PM      Component Value Range   Squamous Epithelial / LPF RARE  RARE    WBC, UA 0-2  <3 (WBC/hpf)   RBC / HPF 0-2  <3 (RBC/hpf)   Bacteria, UA RARE  RARE    Casts HYALINE CASTS (*) NEGATIVE   POCT I-STAT TROPONIN I      Status: Normal   Collection Time   10/25/11 11:55 PM      Component Value Range   Troponin i, poc 0.01  0.00 - 0.08 (ng/mL)   Comment 3           GLUCOSE, CAPILLARY     Status: Abnormal   Collection Time   10/26/11  1:33 AM      Component Value Range   Glucose-Capillary 256 (*) 70 - 99 (mg/dL)   Comment 1 Documented in Chart    CARDIAC PANEL(CRET KIN+CKTOT+MB+TROPI)     Status: Normal   Collection Time   10/26/11  3:07 AM      Component Value Range   Total CK 59  7 - 232 (U/L)   CK, MB 2.4  0.3 - 4.0 (ng/mL)   Troponin I <0.30  <0.30 (ng/mL)   Relative Index RELATIVE INDEX IS INVALID  0.0 - 2.5   BASIC METABOLIC PANEL     Status: Abnormal   Collection Time   10/26/11  3:14 AM      Component Value Range   Sodium 131 (*) 135 - 145 (mEq/L)   Potassium 3.4 (*) 3.5 - 5.1 (mEq/L)   Chloride 95 (*) 96 - 112 (mEq/L)   CO2 20  19 - 32 (mEq/L)   Glucose, Bld 271 (*) 70 - 99 (mg/dL)   BUN 16  6 - 23 (mg/dL)   Creatinine, Ser 4.09  0.50 - 1.35 (mg/dL)   Calcium 9.0  8.4 - 81.1 (mg/dL)   GFR calc non Af Amer >90  >90 (mL/min)  GFR calc Af Amer >90  >90 (mL/min)  CBC     Status: Abnormal   Collection Time   10/26/11  3:14 AM      Component Value Range   WBC 11.4 (*) 4.0 - 10.5 (K/uL)   RBC 4.82  4.22 - 5.81 (MIL/uL)   Hemoglobin 14.5  13.0 - 17.0 (g/dL)   HCT 16.1  09.6 - 04.5 (%)   MCV 84.2  78.0 - 100.0 (fL)   MCH 30.1  26.0 - 34.0 (pg)   MCHC 35.7  30.0 - 36.0 (g/dL)   RDW 40.9  81.1 - 91.4 (%)   Platelets 209  150 - 400 (K/uL)  GLUCOSE, CAPILLARY     Status: Abnormal   Collection Time   10/26/11  3:53 AM      Component Value Range   Glucose-Capillary 253 (*) 70 - 99 (mg/dL)  GLUCOSE, CAPILLARY     Status: Abnormal   Collection Time   10/26/11  7:23 AM      Component Value Range   Glucose-Capillary 260 (*) 70 - 99 (mg/dL)  CARDIAC PANEL(CRET KIN+CKTOT+MB+TROPI)     Status: Normal   Collection Time   10/26/11 10:15 AM      Component Value Range   Total CK 52  7 - 232 (U/L)    CK, MB 1.8  0.3 - 4.0 (ng/mL)   Troponin I <0.30  <0.30 (ng/mL)   Relative Index RELATIVE INDEX IS INVALID  0.0 - 2.5   GLUCOSE, CAPILLARY     Status: Abnormal   Collection Time   10/26/11 11:46 AM      Component Value Range   Glucose-Capillary 252 (*) 70 - 99 (mg/dL)     Micro: No results found for this or any previous visit (from the past 240 hour(s)).  Studies/Results: Dg Chest Portable 1 View  10/25/2011  *RADIOLOGY REPORT*  Clinical Data: Chest pain  PORTABLE CHEST - 1 VIEW  Comparison: 09/13/2005  Findings: The apices are obscured by overlying structures.  No focal consolidation.  No pleural effusion or pneumothorax. Cardiomediastinal contours are mildly prominent to upper normal, similar to prior.  No acute osseous finding visualized.  IMPRESSION: Cardiomediastinal contours upper normal limits on prominent however similar to prior.  No definite radiographic evidence of acute cardiopulmonary process.  Original Report Authenticated By: Waneta Martins, M.D.    Medications:  Scheduled Meds:   . sodium chloride  1,000 mL Intravenous Once  . aspirin  324 mg Oral Once  . enoxaparin (LOVENOX) injection  110 mg Subcutaneous NOW  . enoxaparin (LOVENOX) injection  110 mg Subcutaneous Q12H  . insulin aspart  0-9 Units Subcutaneous Q4H  . insulin aspart  10 Units Intravenous Once  . lidocaine  10 mL Intradermal Once  . living well with diabetes book   Does not apply Once  . metoprolol succinate  25 mg Oral Daily  . nitroGLYCERIN  0.5 inch Topical Q8H  . potassium chloride  20 mEq Oral BID  . sodium chloride  3 mL Intravenous Q12H  . DISCONTD: aspirin  324 mg Oral Once  . DISCONTD: doxycycline  100 mg Oral Q12H  . DISCONTD: doxycycline  100 mg Oral Q12H  . DISCONTD: enoxaparin (LOVENOX) injection  130 mg Subcutaneous Q12H   Continuous Infusions:   . sodium chloride 75 mL/hr at 10/26/11 0243  . DISCONTD: sodium chloride 1,000 mL (10/25/11 2137)  . DISCONTD: sodium chloride    .  DISCONTD: sodium chloride  PRN Meds:.alum & mag hydroxide-simeth, morphine injection, ondansetron (ZOFRAN) IV, ondansetron, DISCONTD: nitroGLYCERIN   Assessment: Principal Problem:  *CHEST PAIN-UNSPECIFIED Active Problems:  HYPERLIPIDEMIA-MIXED  Essential hypertension, benign  Diabetes mellitus  Medically noncompliant  Cellulitis and abscess rt groin  CAD S/P percutaneous coronary angioplasty   Plan: #1 chest pain patient states he had a cardiac cath 5 years ago done by Adolph Pollack neurology History is more suggestive of dyspepsia Chest pain atypical 2-D echo Cardiology notify Discontinue therapeutic Lovenox   #2 diabetes continue sliding scale insulin  #3 cellulitis DC doxycycline changed to vancomycin, wound care consultation appreciated      LOS: 1 day   [facility] 10/26/2011, 12:00 PM

## 2011-10-26 NOTE — ED Notes (Signed)
Admitting MD at bedside.

## 2011-10-26 NOTE — Consult Note (Signed)
ANTIBIOTIC CONSULT NOTE - INITIAL  Pharmacy Consult for Vancomycin Indication: Cellulitis/Abscess R-groin  Allergies  Allergen Reactions  . Horse-Derived Products     REACTION: Rash    Patient Measurements: Height: 6\' 2"  (188 cm) Weight: 246 lb 0.5 oz (111.6 kg) IBW/kg (Calculated) : 82.2    Vital Signs: Temp: 98.2 F (36.8 C) (06/10 0500) Temp src: Oral (06/10 0500) BP: 124/70 mmHg (06/10 0500) Pulse Rate: 84  (06/10 0500) Intake/Output from previous day: 06/09 0701 - 06/10 0700 In: -  Out: 550 [Urine:550] Intake/Output from this shift: Total I/O In: 236 [P.O.:236] Out: 970 [Urine:970]  Labs:  [facility] 10/26/11 0314 10/25/11 2050  WBC 11.4* 13.6*  HGB 14.5 15.6  PLT 209 224  LABCREA -- --  CREATININE 0.71 0.81   Estimated Creatinine Clearance: 128.9 ml/min (by C-G formula based on Cr of 0.71).  Microbiology: No results found for this or any previous visit (from the past 720 hour(s)).  Medical History: Past Medical History  Diagnosis Date  . Diabetes mellitus   . Hypertension   . GERD (gastroesophageal reflux disease)   . Arthritis    Medical non-compliance  Medications:  Prescriptions prior to admission  Medication Sig Dispense Refill  . aspirin 81 MG chewable tablet Chew 324 mg by mouth once.      Marland Kitchen aspirin EC 81 MG tablet Take 81 mg by mouth daily.       Scheduled:    . sodium chloride  1,000 mL Intravenous Once  . aspirin  324 mg Oral Once  . enoxaparin (LOVENOX) injection  110 mg Subcutaneous NOW  . enoxaparin (LOVENOX) injection  40 mg Subcutaneous Q24H  . insulin aspart  0-9 Units Subcutaneous Q4H  . insulin aspart  10 Units Intravenous Once  . lidocaine  10 mL Intradermal Once  . living well with diabetes book   Does not apply Once  . metoprolol succinate  25 mg Oral Daily  . nitroGLYCERIN  0.5 inch Topical Q8H  . pantoprazole (PROTONIX) IV  40 mg Intravenous Q12H  . potassium chloride  20 mEq Oral BID  . sodium chloride  3 mL  Intravenous Q12H  . sucralfate  1 g Oral TID WC & HS  . DISCONTD: aspirin  324 mg Oral Once  . DISCONTD: doxycycline  100 mg Oral Q12H  . DISCONTD: doxycycline  100 mg Oral Q12H  . DISCONTD: enoxaparin (LOVENOX) injection  110 mg Subcutaneous Q12H  . DISCONTD: enoxaparin (LOVENOX) injection  130 mg Subcutaneous Q12H   Anti-infectives     Start     Dose/Rate Route Frequency Ordered Stop   10/26/11 1000   doxycycline (VIBRA-TABS) tablet 100 mg  Status:  Discontinued        100 mg Oral Every 12 hours 10/26/11 0201 10/26/11 1159   10/26/11 0015   doxycycline (VIBRA-TABS) tablet 100 mg  Status:  Discontinued        100 mg Oral Every 12 hours 10/26/11 0000 10/26/11 0205          Assessment:  Patient is a 61 y/o male admitted for treatment of cellulitis/abscess of right groin.  He also has complained of chest pain which is most likely non-cardiac. Estimated Creatinine Clearance: 128.9 ml/min (by C-G formula based on Cr of 0.71).  Wound culture in process.  Goal of Therapy:   Vancomycin trough level 10-15 mcg/ml  Plan:  Vancomycin 2 gm IV x 1, then 1500 mg IV q 12 hours.  Vancomycin trough level when indicated.  Lequisha Cammack, Elisha Headland, Pharm.D. 10/26/2011 1:18 PM

## 2011-10-26 NOTE — Plan of Care (Signed)
Problem: Consults Goal: Diabetes Guidelines if Diabetic/Glucose > 140 If diabetic or lab glucose is > 140 mg/dl - Initiate Diabetes/Hyperglycemia Guidelines & Document Interventions  Outcome: Progressing Patient given [employer] handouts on diabetes, Diabetes book and has been offered to watch the DM videos.

## 2011-10-26 NOTE — Progress Notes (Signed)
Results for [name] [name], [name] [name] (MRN [id number]) as of 10/26/2011 15:54  Ref. Range 10/26/2011 01:33 10/26/2011 03:53 10/26/2011 07:23 10/26/2011 11:46  Glucose-Capillary Latest Range: 70-99 mg/dL 409 (H) 811 (H) 914 (H) 252 (H)   Admitted with CP and uncontrolled diabetes.  A1c 14.3% (10/26/11).  Patient states he quit taking Metformin and Glipizide several months ago b/c he could not afford to go to his primary MD to get prescription refills.    Spoke with patient about the extreme importance of checking and controlling CBGs in order to prevent acute and long-term complications.  Discussed with patient how high blood sugar levels can aggravate his heart disease.  Explained what an A1c is and what it measures.  Reminded patient that his goal A1c as a patient living with diabetes is 7% or less.  Reviewed basic diet information with patient and encouraged patient to check his CBGs at least 2-3 times per day and to record all the results for his primary MD.  Patient's grandaughter, Marisue Ivan was present and involved.  Gave patient information on purchasing an inexpensive CBG meter and strips at [employer].  Meter is $16 and a box of 50 strips is $9.    Discussed with patient the possibility that he made need insulin at d/c b/c of his elevated A1c.  Patient told me he is willing to take insulin if needed.  He told me he would rather take insulin than "pills".  Have ordered an insulin starter kit so RN may begin insulin instruction with patient at bedside.  MD, if you decide to send patient home on insulin, please switch him to a more affordable insulin like Novolin 70/30.  Patient can get Novolin 70/30 insulin at [facility] for $24.88 per vial.  Will follow. Ambrose Finland RN, MSN, CDE Diabetes Coordinator Inpatient Diabetes Program [phone]

## 2011-10-26 NOTE — Consult Note (Signed)
CARDIOLOGY CONSULT NOTE  Patient ID: [name] [name] MRN: [id number] DOB/AGE: June 29, 1950 61 y.o.  Admit date: 10/25/2011 Primary Physician Dr. Lysbeth Galas Primary Cardiologist    Dr. Eden Emms Chief Complaint   Chest Pain  HPI:  The patient has a history of CAD.  He was doing well until two nights ago.  He developed chest pain left of his sternum.  This would come and go.  It was 10/10 at the peak.  It was not like his previous angina which was between his shoulder blades.  This was dull with some "sharp spikes".  It did not radiate.  He was nauseated but without vomiting.  He denies any new SOB.  He has no PND or orthopnea.  He has no palpitations presyncope or syncope.  He has no cough or edema.  He denies fevers or chills.  He was transported via EMS to the ED.  EKG demonstrates non specific T wave changes.  Enzymes x 3 have been negative.  Of note he had a groin abscess that was lanced in the ER.  Past Medical History  Diagnosis Date  . Diabetes mellitus   . Hypertension   . GERD (gastroesophageal reflux disease)   . Arthritis   . CAD (coronary artery disease)     2010  LAD 50%, RCA 100%.  DES to RCA.  EF 55%    Past Surgical History  Procedure Date  . Coronary stent placement   . Hernia repair 2000  . Cystectomy 1980    left hand and lip    Allergies  Allergen Reactions  . Horse-Derived Products     REACTION: Rash   Prescriptions prior to admission  Medication Sig Dispense Refill  . aspirin EC 81 MG tablet Take 81 mg by mouth daily.       Family History  Problem Relation Age of Onset  . Diabetes Father   . Valvular heart disease Father   . Alzheimer's disease Mother     History   Social History  . Marital Status: Divorced    Spouse Name: N/A    Number of Children: 2  . Years of Education: N/A   Occupational History  . Disabled secondary to back pain   Social History Main Topics  . Smoking status: Current Everyday Smoker -- 1.5 packs/day for 51 years    Types:  Cigarettes    Social History Narrative   Lives alone.  Disabled.  Unable to afford medicines.       ROS:  Postive for depression, back pain.  Otherwise as stated in the HPI and negative for all other systems.  Physical Exam: Blood pressure 124/70, pulse 84, temperature 98.2 F (36.8 C), temperature source Oral, resp. rate 18, height 6\' 2"  (1.88 m), weight 246 lb 0.5 oz (111.6 kg), SpO2 100.00%.  GENERAL:  Well appearing HEENT:  Pupils equal round and reactive, fundi not visualized, oral mucosa unremarkable NECK:  No jugular venous distention, waveform within normal limits, carotid upstroke brisk and symmetric, no bruits, no thyromegaly LYMPHATICS:  No cervical, inguinal adenopathy LUNGS:  Clear to auscultation bilaterally BACK:  No CVA tenderness CHEST:  Unremarkable HEART:  PMI not displaced or sustained,S1 and S2 within normal limits, no S3, no S4, no clicks, no rubs, no murmurs ABD:  Flat, positive bowel sounds normal in frequency in pitch, no bruits, no rebound, no guarding, no midline pulsatile mass, no hepatomegaly, no splenomegaly EXT:  2 plus pulses throughout, no edema, no cyanosis no clubbing SKIN:  No  rashes no nodules, dressed tender wound in the right inguinal region. NEURO:  Cranial nerves II through XII grossly intact, motor grossly intact throughout PSYCH:  Cognitively intact, oriented to person place and time  Labs: Lab Results  Component Value Date   BUN 16 10/26/2011   Lab Results  Component Value Date   CREATININE 0.71 10/26/2011   Lab Results  Component Value Date   NA 131* 10/26/2011   K 3.4* 10/26/2011   CL 95* 10/26/2011   CO2 20 10/26/2011   Lab Results  Component Value Date   CKTOTAL 52 10/26/2011   CKMB 1.8 10/26/2011   TROPONINI <0.30 10/26/2011   Lab Results  Component Value Date   WBC 11.4* 10/26/2011   HGB 14.5 10/26/2011   HCT 40.6 10/26/2011   MCV 84.2 10/26/2011   PLT 209 10/26/2011    Lab Results  Component Value Date   ALT 5 10/25/2011    AST 5 10/25/2011   ALKPHOS 85 10/25/2011   BILITOT 0.4 10/25/2011    Radiology: CXR Cardiomediastinal contours upper normal limits on prominent however  similar to prior. No definite radiographic evidence of acute  cardiopulmonary process.   ZOX:WRUEA tachycardia, rate 105, nonspecific inferior and lateral T wave inversion. No old tracings to compare 10/26/2011  ASSESSMENT AND PLAN:   1)  Chest Pain:  Typical and atypical features.  Given the ongoing risk factors he needs a stress test prior to discharge.  He would not be able to walk a treadmill so he will have a [employer].  2)  Diabetes:  He has been noncompliant with medications.  We talked about the risk of death, stroke and renal failure as well as CAD/MI.  Plans per his primary team and Dr. Lysbeth Galas  3)  Tobacco:  He is committed to quitting smoking and he does not need a medication to quit.  4)  Hyperlipidemia:  Lipid profile pending.  He will not take a statin.  He can be educated about diet but I doubt compliance.   SignedRollene Rotunda 10/26/2011, 2:43 PM

## 2011-10-27 ENCOUNTER — Inpatient Hospital Stay (HOSPITAL_COMMUNITY): Payer: Self-pay

## 2011-10-27 DIAGNOSIS — R079 Chest pain, unspecified: Secondary | ICD-10-CM

## 2011-10-27 DIAGNOSIS — IMO0001 Reserved for inherently not codable concepts without codable children: Secondary | ICD-10-CM

## 2011-10-27 DIAGNOSIS — L0291 Cutaneous abscess, unspecified: Secondary | ICD-10-CM

## 2011-10-27 DIAGNOSIS — E1165 Type 2 diabetes mellitus with hyperglycemia: Secondary | ICD-10-CM

## 2011-10-27 DIAGNOSIS — I2510 Atherosclerotic heart disease of native coronary artery without angina pectoris: Secondary | ICD-10-CM

## 2011-10-27 DIAGNOSIS — L0390 Cellulitis, unspecified: Secondary | ICD-10-CM

## 2011-10-27 LAB — GLUCOSE, CAPILLARY
Glucose-Capillary: 240 mg/dL — ABNORMAL HIGH (ref 70–99)
Glucose-Capillary: 261 mg/dL — ABNORMAL HIGH (ref 70–99)
Glucose-Capillary: 270 mg/dL — ABNORMAL HIGH (ref 70–99)
Glucose-Capillary: 309 mg/dL — ABNORMAL HIGH (ref 70–99)
Glucose-Capillary: 405 mg/dL — ABNORMAL HIGH (ref 70–99)

## 2011-10-27 MED ORDER — INSULIN ASPART PROT & ASPART (70-30 MIX) 100 UNIT/ML ~~LOC~~ SUSP
15.0000 [IU] | Freq: Two times a day (BID) | SUBCUTANEOUS | Status: DC
  Filled 2011-10-27: qty 3

## 2011-10-27 MED ORDER — INSULIN ASPART PROT & ASPART (70-30 MIX) 100 UNIT/ML ~~LOC~~ SUSP
25.0000 [IU] | Freq: Two times a day (BID) | SUBCUTANEOUS | Status: DC
  Filled 2011-10-27: qty 3

## 2011-10-27 MED ORDER — MORPHINE SULFATE 2 MG/ML IJ SOLN
2.0000 mg | INTRAMUSCULAR | Status: DC | PRN
  Administered 2011-10-28 – 2011-10-29 (×4): 2 mg via INTRAVENOUS
  Filled 2011-10-27 (×5): qty 1

## 2011-10-27 MED ORDER — INSULIN ASPART PROT & ASPART (70-30 MIX) 100 UNIT/ML ~~LOC~~ SUSP
25.0000 [IU] | Freq: Two times a day (BID) | SUBCUTANEOUS | Status: DC
  Administered 2011-10-27 – 2011-10-28 (×2): 25 [IU] via SUBCUTANEOUS
  Filled 2011-10-27: qty 10

## 2011-10-27 MED ORDER — REGADENOSON 0.4 MG/5ML IV SOLN
INTRAVENOUS | Status: AC
  Administered 2011-10-27: 0.4 mg via INTRAVENOUS
  Filled 2011-10-27: qty 5

## 2011-10-27 MED ORDER — BD GETTING STARTED TAKE HOME KIT: 3/10ML X 30G SYRINGES
1.0000 | Freq: Once | Status: DC
  Filled 2011-10-27: qty 1

## 2011-10-27 MED ORDER — PANTOPRAZOLE SODIUM 40 MG PO TBEC
40.0000 mg | DELAYED_RELEASE_TABLET | Freq: Two times a day (BID) | ORAL | Status: DC
  Administered 2011-10-27 – 2011-10-29 (×4): 40 mg via ORAL
  Filled 2011-10-27 (×2): qty 1
  Filled 2011-10-27: qty 2
  Filled 2011-10-27 (×2): qty 1

## 2011-10-27 MED ORDER — ASPIRIN 81 MG PO CHEW
81.0000 mg | CHEWABLE_TABLET | Freq: Every day | ORAL | Status: DC
  Administered 2011-10-27 – 2011-10-29 (×3): 81 mg via ORAL
  Filled 2011-10-27 (×3): qty 1

## 2011-10-27 MED ORDER — INSULIN ASPART 100 UNIT/ML ~~LOC~~ SOLN
0.0000 [IU] | Freq: Three times a day (TID) | SUBCUTANEOUS | Status: DC
  Administered 2011-10-27: 20 [IU] via SUBCUTANEOUS
  Administered 2011-10-27: 15 [IU] via SUBCUTANEOUS
  Administered 2011-10-28: 11 [IU] via SUBCUTANEOUS
  Administered 2011-10-28: 15 [IU] via SUBCUTANEOUS
  Administered 2011-10-28 – 2011-10-29 (×3): 7 [IU] via SUBCUTANEOUS

## 2011-10-27 MED ORDER — TECHNETIUM TC 99M TETROFOSMIN IV KIT
30.0000 | PACK | Freq: Once | INTRAVENOUS | Status: AC | PRN
  Administered 2011-10-27: 30 via INTRAVENOUS

## 2011-10-27 MED ORDER — TECHNETIUM TC 99M MEDRONATE IV KIT: 25.0000 | PACK | Freq: Once | INTRAVENOUS | Status: AC | PRN

## 2011-10-27 MED ORDER — BD GETTING STARTED TAKE HOME KIT: 1/2ML X 30G SYRINGES: 1.0000 | Freq: Once | Status: DC

## 2011-10-27 MED ORDER — TECHNETIUM TC 99M TETROFOSMIN IV KIT
10.0000 | PACK | Freq: Once | INTRAVENOUS | Status: AC | PRN
  Administered 2011-10-27: 10 via INTRAVENOUS

## 2011-10-27 MED ORDER — BD GETTING STARTED TAKE HOME KIT: 1/2ML X 30G SYRINGES
1.0000 | Freq: Once | Status: AC
  Administered 2011-10-27: 1
  Filled 2011-10-27: qty 1

## 2011-10-27 MED ORDER — ATORVASTATIN CALCIUM 20 MG PO TABS
20.0000 mg | ORAL_TABLET | Freq: Every day | ORAL | Status: DC
  Filled 2011-10-27: qty 1

## 2011-10-27 NOTE — Progress Notes (Signed)
Patient ID: [name] [name], male   DOB: 28-Sep-1950, 61 y.o.   MRN: [id number]    SUBJECTIVE: No further chest pain.      Marland Kitchen aspirin  324 mg Oral Once  . aspirin  81 mg Oral Daily  . enoxaparin (LOVENOX) injection  40 mg Subcutaneous Q24H  . insulin aspart  0-9 Units Subcutaneous Q4H  . living well with diabetes book   Does not apply Once  . metoprolol succinate  25 mg Oral Daily  . pantoprazole (PROTONIX) IV  40 mg Intravenous Q12H  . potassium chloride  20 mEq Oral BID  . regadenoson  0.4 mg Intravenous Once  . sodium chloride  3 mL Intravenous Q12H  . sucralfate  1 g Oral TID WC & HS  . vancomycin (VANCOCIN) 1500 mg IVPB  1,500 mg Intravenous Q12H  . vancomycin (VANCOCIN) 1500 mg IVPB  2,000 mg Intravenous Once  . DISCONTD: doxycycline  100 mg Oral Q12H  . DISCONTD: enoxaparin (LOVENOX) injection  110 mg Subcutaneous Q12H  . DISCONTD: nitroGLYCERIN  0.5 inch Topical Q8H      Filed Vitals:   10/26/11 0500 10/26/11 1400 10/26/11 2100 10/27/11 0500  BP: 124/70 123/76 142/86 119/77  Pulse: 84 72 78 70  Temp: 98.2 F (36.8 C) 99.2 F (37.3 C) 99.4 F (37.4 C) 98.7 F (37.1 C)  TempSrc: Oral  Oral Oral  Resp: 18 18 18 18   Height:      Weight:    109.589 kg (241 lb 9.6 oz)  SpO2: 100% 96% 97% 97%    Intake/Output Summary (Last 24 hours) at 10/27/11 0755 Last data filed at 10/26/11 1800  Gross per 24 hour  Intake   1771 ml  Output   1570 ml  Net    201 ml    LABS: Basic Metabolic Panel:  Basename 10/26/11 0314 10/25/11 2050  NA 131* 128*  K 3.4* 3.9  CL 95* 92*  CO2 20 20  GLUCOSE 271* 395*  BUN 16 21  CREATININE 0.71 0.81  CALCIUM 9.0 9.4  MG -- --  PHOS -- --   Liver Function Tests:  Basename 10/25/11 2050  AST 5  ALT 5  ALKPHOS 85  BILITOT 0.4  PROT 6.8  ALBUMIN 3.4*   No results found for this basename: LIPASE:2,AMYLASE:2 in the last 72 hours CBC:  Basename 10/26/11 0314 10/25/11 2050  WBC 11.4* 13.6*  NEUTROABS -- --  HGB 14.5 15.6  HCT  40.6 42.4  MCV 84.2 84.1  PLT 209 224   Cardiac Enzymes:  Basename 10/26/11 1735 10/26/11 1015 10/26/11 0307  CKTOTAL 69 52 59  CKMB 2.0 1.8 2.4  CKMBINDEX -- -- --  TROPONINI <0.30 <0.30 <0.30   BNP: No components found with this basename: POCBNP:3 D-Dimer: No results found for this basename: DDIMER:2 in the last 72 hours Hemoglobin A1C:  Basename 10/26/11 0230  HGBA1C 14.3*   Fasting Lipid Panel: No results found for this basename: CHOL,HDL,LDLCALC,TRIG,CHOLHDL,LDLDIRECT in the last 72 hours Thyroid Function Tests: No results found for this basename: TSH,T4TOTAL,FREET3,T3FREE,THYROIDAB in the last 72 hours Anemia Panel: No results found for this basename: VITAMINB12,FOLATE,FERRITIN,TIBC,IRON,RETICCTPCT in the last 72 hours  RADIOLOGY: Dg Chest Portable 1 View  10/25/2011  *RADIOLOGY REPORT*  Clinical Data: Chest pain  PORTABLE CHEST - 1 VIEW  Comparison: 09/13/2005  Findings: The apices are obscured by overlying structures.  No focal consolidation.  No pleural effusion or pneumothorax. Cardiomediastinal contours are mildly prominent to upper normal, similar to prior.  No acute osseous finding visualized.  IMPRESSION: Cardiomediastinal contours upper normal limits on prominent however similar to prior.  No definite radiographic evidence of acute cardiopulmonary process.  Original Report Authenticated By: Waneta Martins, M.D.    PHYSICAL EXAM General: NAD Neck: No JVD, no thyromegaly or thyroid nodule.  Lungs: Clear to auscultation bilaterally with normal respiratory effort. CV: Nondisplaced PMI.  Heart regular S1/S2, no S3/S4, no murmur.  No peripheral edema.  No carotid bruit.  Normal pedal pulses.  Abdomen: Soft, nontender, no hepatosplenomegaly, no distention.  Neurologic: Alert and oriented x 3.  Psych: Normal affect. Extremities: No clubbing or cyanosis.   TELEMETRY: Reviewed telemetry pt in NSR  ASSESSMENT AND PLAN:  62 yo with history of DM and CAD (prior  inferior MI with PCI to RCA) presented with atypical chest pain as well as cellulitis.  - Lexiscan myoview today.  - Start ASA 81 mg daily.  - Needs to be on statin => will check lipids when fasting to decide which statin and what dose.  - Tx cellulitis per primary team.   Marca Ancona 10/27/2011 7:57 AM

## 2011-10-27 NOTE — Progress Notes (Signed)
Subjective: Complaining of pain in the groin area   Objective: Vital signs in last 24 hours: Filed Vitals:   10/26/11 0500 10/26/11 1400 10/26/11 2100 10/27/11 0500  BP: 124/70 123/76 142/86 119/77  Pulse: 84 72 78 70  Temp: 98.2 F (36.8 C) 99.2 F (37.3 C) 99.4 F (37.4 C) 98.7 F (37.1 C)  TempSrc: Oral  Oral Oral  Resp: 18 18 18 18   Height:      Weight:    109.589 kg (241 lb 9.6 oz)  SpO2: 100% 96% 97% 97%    Intake/Output Summary (Last 24 hours) at 10/27/11 0907 Last data filed at 10/26/11 1800  Gross per 24 hour  Intake   1771 ml  Output   1200 ml  Net    571 ml    Weight change: -2.011 kg (-4 lb 6.9 oz) GENERAL: Well appearing  HEENT: Pupils equal round and reactive, fundi not visualized, oral mucosa unremarkable  NECK: No jugular venous distention, waveform within normal limits, carotid upstroke brisk and symmetric, no bruits, no thyromegaly  LYMPHATICS: No cervical, inguinal adenopathy  LUNGS: Clear to auscultation bilaterally  BACK: No CVA tenderness  CHEST: Unremarkable  HEART: PMI not displaced or sustained,S1 and S2 within normal limits, no S3, no S4, no clicks, no rubs, no murmurs  ABD: Flat, positive bowel sounds normal in frequency in pitch, no bruits, no rebound, no guarding, no midline pulsatile mass, no hepatomegaly, no splenomegaly  EXT: 2 plus pulses throughout, no edema, no cyanosis no clubbing  SKIN: No rashes no nodules, dressed tender wound in the right inguinal region.  NEURO: Cranial nerves II through XII grossly intact, motor grossly intact throughout  PSYCH: Cognitively intact, oriented to person place and time    Lab Results: Results for orders placed during the hospital encounter of 10/25/11 (from the past 24 hour(s))  CARDIAC PANEL(CRET KIN+CKTOT+MB+TROPI)     Status: Normal   Collection Time   10/26/11 10:15 AM      Component Value Range   Total CK 52  7 - 232 (U/L)   CK, MB 1.8  0.3 - 4.0 (ng/mL)   Troponin I <0.30  <0.30 (ng/mL)    Relative Index RELATIVE INDEX IS INVALID  0.0 - 2.5   GLUCOSE, CAPILLARY     Status: Abnormal   Collection Time   10/26/11 11:46 AM      Component Value Range   Glucose-Capillary 252 (*) 70 - 99 (mg/dL)  GLUCOSE, CAPILLARY     Status: Abnormal   Collection Time   10/26/11  4:35 PM      Component Value Range   Glucose-Capillary 259 (*) 70 - 99 (mg/dL)  CARDIAC PANEL(CRET KIN+CKTOT+MB+TROPI)     Status: Normal   Collection Time   10/26/11  5:35 PM      Component Value Range   Total CK 69  7 - 232 (U/L)   CK, MB 2.0  0.3 - 4.0 (ng/mL)   Troponin I <0.30  <0.30 (ng/mL)   Relative Index RELATIVE INDEX IS INVALID  0.0 - 2.5   GLUCOSE, CAPILLARY     Status: Abnormal   Collection Time   10/26/11  8:04 PM      Component Value Range   Glucose-Capillary 315 (*) 70 - 99 (mg/dL)  GLUCOSE, CAPILLARY     Status: Abnormal   Collection Time   10/27/11 12:05 AM      Component Value Range   Glucose-Capillary 261 (*) 70 - 99 (mg/dL)  GLUCOSE, CAPILLARY  Status: Abnormal   Collection Time   10/27/11  4:52 AM      Component Value Range   Glucose-Capillary 270 (*) 70 - 99 (mg/dL)     Micro: Recent Results (from the past 240 hour(s))  WOUND CULTURE     Status: Normal (Preliminary result)   Collection Time   10/25/11 10:57 PM      Component Value Range Status Comment   Specimen Description WOUND GROIN   Final    Special Requests NONE   Final    Gram Stain     Final    Value: NO WBC SEEN     NO SQUAMOUS EPITHELIAL CELLS SEEN     NO ORGANISMS SEEN   Culture PENDING   Incomplete    Report Status PENDING   Incomplete     Studies/Results: Dg Chest Portable 1 View  10/25/2011  *RADIOLOGY REPORT*  Clinical Data: Chest pain  PORTABLE CHEST - 1 VIEW  Comparison: 09/13/2005  Findings: The apices are obscured by overlying structures.  No focal consolidation.  No pleural effusion or pneumothorax. Cardiomediastinal contours are mildly prominent to upper normal, similar to prior.  No acute osseous  finding visualized.  IMPRESSION: Cardiomediastinal contours upper normal limits on prominent however similar to prior.  No definite radiographic evidence of acute cardiopulmonary process.  Original Report Authenticated By: Waneta Martins, M.D.    Medications:  Scheduled Meds:   . aspirin  324 mg Oral Once  . aspirin  81 mg Oral Daily  . enoxaparin (LOVENOX) injection  40 mg Subcutaneous Q24H  . insulin aspart  0-9 Units Subcutaneous Q4H  . living well with diabetes book   Does not apply Once  . metoprolol succinate  25 mg Oral Daily  . pantoprazole (PROTONIX) IV  40 mg Intravenous Q12H  . potassium chloride  20 mEq Oral BID  . regadenoson      . regadenoson  0.4 mg Intravenous Once  . sodium chloride  3 mL Intravenous Q12H  . sucralfate  1 g Oral TID WC & HS  . vancomycin (VANCOCIN) 1500 mg IVPB  1,500 mg Intravenous Q12H  . vancomycin (VANCOCIN) 1500 mg IVPB  2,000 mg Intravenous Once  . DISCONTD: doxycycline  100 mg Oral Q12H  . DISCONTD: enoxaparin (LOVENOX) injection  110 mg Subcutaneous Q12H  . DISCONTD: nitroGLYCERIN  0.5 inch Topical Q8H   Continuous Infusions:   . sodium chloride 75 mL/hr at 10/26/11 0243   PRN Meds:.acetaminophen, alum & mag hydroxide-simeth, morphine injection, ondansetron (ZOFRAN) IV, ondansetron   Assessment: Principal Problem:  *CHEST PAIN-UNSPECIFIED Active Problems:  HYPERLIPIDEMIA-MIXED  Essential hypertension, benign  Diabetes mellitus  Medically noncompliant  Cellulitis and abscess rt groin  CAD S/P percutaneous coronary angioplasty   Plan: #1 chest pain, likely scan Myoview per cardiology, start aspirin, start the patient on Lipitor, lipid panel  #2 cellulitis, started on vancomycin IV, wound care nurse following, will need 1-2 more days of IV antibiotics  #3 diabetes hemoglobin A1c of 14.3. Noncompliant with metformin and glipizide We'll do insulin teaching start the patient on normal and 70 /30  #4 disposition anticipate  discharge in the next one to 2 days   LOS: 2 days   [facility] 10/27/2011, 9:07 AM

## 2011-10-27 NOTE — Progress Notes (Signed)
Educated patient on the possibility he may need to resume a statin at discharge, depending on results of FLP.  Patient told RN he would not take cholesterol medication because,  "they make me feel awful."  MD notified. [name] [name]

## 2011-10-27 NOTE — Progress Notes (Signed)
Pt's CBG is 405.  MD notified.  New orders received.

## 2011-10-28 DIAGNOSIS — I2510 Atherosclerotic heart disease of native coronary artery without angina pectoris: Secondary | ICD-10-CM

## 2011-10-28 DIAGNOSIS — L0291 Cutaneous abscess, unspecified: Secondary | ICD-10-CM

## 2011-10-28 DIAGNOSIS — E1165 Type 2 diabetes mellitus with hyperglycemia: Secondary | ICD-10-CM

## 2011-10-28 DIAGNOSIS — IMO0001 Reserved for inherently not codable concepts without codable children: Secondary | ICD-10-CM

## 2011-10-28 DIAGNOSIS — L0390 Cellulitis, unspecified: Secondary | ICD-10-CM

## 2011-10-28 DIAGNOSIS — R079 Chest pain, unspecified: Secondary | ICD-10-CM

## 2011-10-28 LAB — CBC
HCT: 41.3 % (ref 39.0–52.0)
Hemoglobin: 14.6 g/dL (ref 13.0–17.0)
MCH: 29.8 pg (ref 26.0–34.0)
MCHC: 35.4 g/dL (ref 30.0–36.0)
MCV: 84.3 fL (ref 78.0–100.0)
Platelets: 220 10*3/uL (ref 150–400)
RBC: 4.9 MIL/uL (ref 4.22–5.81)
RDW: 12.6 % (ref 11.5–15.5)
WBC: 8.1 10*3/uL (ref 4.0–10.5)

## 2011-10-28 LAB — GLUCOSE, CAPILLARY
Glucose-Capillary: 155 mg/dL — ABNORMAL HIGH (ref 70–99)
Glucose-Capillary: 248 mg/dL — ABNORMAL HIGH (ref 70–99)
Glucose-Capillary: 270 mg/dL — ABNORMAL HIGH (ref 70–99)
Glucose-Capillary: 306 mg/dL — ABNORMAL HIGH (ref 70–99)
Glucose-Capillary: 316 mg/dL — ABNORMAL HIGH (ref 70–99)
Glucose-Capillary: 350 mg/dL — ABNORMAL HIGH (ref 70–99)

## 2011-10-28 LAB — LIPID PANEL
Cholesterol: 240 mg/dL — ABNORMAL HIGH (ref 0–200)
HDL: 20 mg/dL — ABNORMAL LOW (ref >39)
LDL Cholesterol: UNDETERMINED mg/dL (ref 0–99)
Total CHOL/HDL Ratio: 12 RATIO
Triglycerides: 779 mg/dL — ABNORMAL HIGH (ref <150)
VLDL: UNDETERMINED mg/dL (ref 0–40)

## 2011-10-28 LAB — BASIC METABOLIC PANEL
BUN: 14 mg/dL (ref 6–23)
CO2: 24 mEq/L (ref 19–32)
Calcium: 9.4 mg/dL (ref 8.4–10.5)
Chloride: 96 mEq/L (ref 96–112)
Creatinine, Ser: 0.7 mg/dL (ref 0.50–1.35)
GFR calc Af Amer: >90 mL/min (ref >90)
GFR calc non Af Amer: >90 mL/min (ref >90)
Glucose, Bld: 310 mg/dL — ABNORMAL HIGH (ref 70–99)
Potassium: 3.7 mEq/L (ref 3.5–5.1)
Sodium: 131 mEq/L — ABNORMAL LOW (ref 135–145)

## 2011-10-28 LAB — WOUND CULTURE: Gram Stain: NONE SEEN

## 2011-10-28 MED ORDER — INSULIN ASPART PROT & ASPART (70-30 MIX) 100 UNIT/ML ~~LOC~~ SUSP
33.0000 [IU] | Freq: Two times a day (BID) | SUBCUTANEOUS | Status: DC
  Administered 2011-10-28 – 2011-10-29 (×2): 33 [IU] via SUBCUTANEOUS
  Filled 2011-10-28: qty 3

## 2011-10-28 MED ORDER — AMOXICILLIN 500 MG PO CAPS
500.0000 mg | ORAL_CAPSULE | Freq: Two times a day (BID) | ORAL | Status: DC
  Administered 2011-10-28 – 2011-10-29 (×3): 500 mg via ORAL
  Filled 2011-10-28 (×5): qty 1

## 2011-10-28 MED ORDER — DOXYCYCLINE HYCLATE 100 MG PO TABS
100.0000 mg | ORAL_TABLET | Freq: Two times a day (BID) | ORAL | Status: DC
  Administered 2011-10-28 – 2011-10-29 (×3): 100 mg via ORAL
  Filled 2011-10-28 (×5): qty 1

## 2011-10-28 NOTE — Progress Notes (Signed)
Results for BROWNIE, NEHME (MRN [id number]) as of 10/28/2011 12:41  Ref. Range 10/27/2011 11:13 10/27/2011 16:50 10/27/2011 20:07  Glucose-Capillary Latest Range: 70-99 mg/dL 409 (H) 811 (H) 914 (H)    Results for JAYTEN, GABBARD (MRN [id number]) as of 10/28/2011 12:41  Ref. Range 10/28/2011 07:38 10/28/2011 11:22  Glucose-Capillary Latest Range: 70-99 mg/dL 086 (H) 578 (H)    Noted patient started on 70/30 insulin last night- 25 units bid with meals (1st dose was given at supper yesterday).  Patient may likely require more 70/30 insulin based on CBG results.  Will follow.    Patient stated he would rather take insulin than "pills" to control his diabetes.  Insulin starter kit was ordered 2 days ago.  If decision made to send patient home on insulin, please d/c home on "Novolin 70/30 insulin".  He can purchase this insulin for $24.88 per vial at [facility] pharmacy.  Will follow. Ambrose Finland RN, MSN, CDE Diabetes Coordinator Inpatient Diabetes Program [phone]

## 2011-10-28 NOTE — Progress Notes (Signed)
Transferred to 5508 from 3700 via wheelchair. VSS. IV in right wrist saline locked. Alert and oriented. Assessment completed. Will continue to monitor.

## 2011-10-28 NOTE — Progress Notes (Signed)
Patient ID: [name] [name], male   DOB: 22-Aug-1950, 61 y.o.   MRN: [id number]     SUBJECTIVE: No further chest pain.      Marland Kitchen aspirin  324 mg Oral Once  . aspirin  81 mg Oral Daily  . bd getting started take home kit  1 kit Other Once  . enoxaparin (LOVENOX) injection  40 mg Subcutaneous Q24H  . insulin aspart  0-20 Units Subcutaneous TID WC  . insulin aspart protamine-insulin aspart  25 Units Subcutaneous BID WC  . metoprolol succinate  25 mg Oral Daily  . pantoprazole  40 mg Oral BID AC  . regadenoson  0.4 mg Intravenous Once  . sodium chloride  3 mL Intravenous Q12H  . sucralfate  1 g Oral TID WC & HS  . vancomycin (VANCOCIN) 1500 mg IVPB  1,500 mg Intravenous Q12H  . DISCONTD: atorvastatin  20 mg Oral q1800  . DISCONTD: bd getting started take home kit  1 kit Other Once  . DISCONTD: bd getting started take home kit  1 kit Other Once  . DISCONTD: insulin aspart  0-9 Units Subcutaneous Q4H  . DISCONTD: insulin aspart protamine-insulin aspart  15 Units Subcutaneous BID WC  . DISCONTD: insulin aspart protamine-insulin aspart  25 Units Subcutaneous BID WC  . DISCONTD: insulin aspart protamine-insulin aspart  25 Units Subcutaneous BID WC  . DISCONTD: pantoprazole (PROTONIX) IV  40 mg Intravenous Q12H      Filed Vitals:   10/27/11 1158 10/27/11 1400 10/27/11 2100 10/28/11 0500  BP: 130/88 137/83 146/89 114/75  Pulse:  73 70 62  Temp:  98.2 F (36.8 C) 98.6 F (37 C) 97.8 F (36.6 C)  TempSrc:  Oral Oral Oral  Resp:  18 20 20   Height:      Weight:    109.725 kg (241 lb 14.4 oz)  SpO2:  99% 99% 95%   No intake or output data in the 24 hours ending 10/28/11 0818  LABS: Basic Metabolic Panel:  Basename 10/28/11 0500 10/26/11 0314  NA 131* 131*  K 3.7 3.4*  CL 96 95*  CO2 24 20  GLUCOSE 310* 271*  BUN 14 16  CREATININE 0.70 0.71  CALCIUM 9.4 9.0  MG -- --  PHOS -- --   Liver Function Tests:  Basename 10/25/11 2050  AST 5  ALT 5  ALKPHOS 85  BILITOT 0.4  PROT  6.8  ALBUMIN 3.4*   No results found for this basename: LIPASE:2,AMYLASE:2 in the last 72 hours CBC:  Basename 10/28/11 0500 10/26/11 0314  WBC 8.1 11.4*  NEUTROABS -- --  HGB 14.6 14.5  HCT 41.3 40.6  MCV 84.3 84.2  PLT 220 209   Cardiac Enzymes:  Basename 10/26/11 1735 10/26/11 1015 10/26/11 0307  CKTOTAL 69 52 59  CKMB 2.0 1.8 2.4  CKMBINDEX -- -- --  TROPONINI <0.30 <0.30 <0.30   BNP: No components found with this basename: POCBNP:3 D-Dimer: No results found for this basename: DDIMER:2 in the last 72 hours Hemoglobin A1C:  Basename 10/26/11 0230  HGBA1C 14.3*   Fasting Lipid Panel:  Basename 10/28/11 0500  CHOL 240*  HDL 20*  LDLCALC UNABLE TO CALCULATE IF TRIGLYCERIDE OVER 400 mg/dL  TRIG 191*  CHOLHDL 47.8  LDLDIRECT --   Thyroid Function Tests: No results found for this basename: TSH,T4TOTAL,FREET3,T3FREE,THYROIDAB in the last 72 hours Anemia Panel: No results found for this basename: VITAMINB12,FOLATE,FERRITIN,TIBC,IRON,RETICCTPCT in the last 72 hours  RADIOLOGY: Dg Chest Portable 1 View  10/25/2011  *  RADIOLOGY REPORT*  Clinical Data: Chest pain  PORTABLE CHEST - 1 VIEW  Comparison: 09/13/2005  Findings: The apices are obscured by overlying structures.  No focal consolidation.  No pleural effusion or pneumothorax. Cardiomediastinal contours are mildly prominent to upper normal, similar to prior.  No acute osseous finding visualized.  IMPRESSION: Cardiomediastinal contours upper normal limits on prominent however similar to prior.  No definite radiographic evidence of acute cardiopulmonary process.  Original Report Authenticated By: Waneta Martins, M.D.    PHYSICAL EXAM General: NAD Neck: No JVD, no thyromegaly or thyroid nodule.  Lungs: Clear to auscultation bilaterally with normal respiratory effort. CV: Nondisplaced PMI.  Heart regular S1/S2, no S3/S4, no murmur.  No peripheral edema.  No carotid bruit.  Normal pedal pulses.  Abdomen: Soft,  nontender, no hepatosplenomegaly, no distention.  Neurologic: Alert and oriented x 3.  Psych: Normal affect. Extremities: No clubbing or cyanosis.   TELEMETRY: Reviewed telemetry pt in NSR  ASSESSMENT AND PLAN:  61 yo with history of DM and CAD (prior inferior MI with PCI to RCA) presented with atypical chest pain as well as cellulitis.  Lexiscan myoview showed normal EF with inferior infarction/peri-infarct ischemia.  He has had no further chest pain.  I suspect that the inferior infarction is old and consistent with prior h/o inferior MI.  If he continues to have significant chest pain in the future, would likely cath to see if RCA stent is ok.  If no further symptoms and as he ruled out for MI, would not evaluate further at this time.  He needs to followup with Dr. Eden Emms as an outpatient and would suggest that he try red yeast rice extract for his cholesterol (has had myalgias with multiple statin agents, may be able to tolerate the very low dose of pravastatin in red yeast rice extract.   Marca Ancona 10/28/2011 8:18 AM

## 2011-10-28 NOTE — Progress Notes (Signed)
Pt will follow up with Dr Eden Emms on November 18, 2011 @ 2pm.

## 2011-10-28 NOTE — Progress Notes (Addendum)
Subjective: Denies any chest pain or shortness of breath.  No other specific complaints.  Objective: Vital signs in last 24 hours: Filed Vitals:   10/27/11 2100 10/28/11 0500 10/28/11 1014 10/28/11 1358  BP: 146/89 114/75 112/74 126/72  Pulse: 70 62 80 64  Temp: 98.6 F (37 C) 97.8 F (36.6 C)  98.4 F (36.9 C)  TempSrc: Oral Oral  Oral  Resp: 20 20  20   Height:      Weight:  109.725 kg (241 lb 14.4 oz)    SpO2: 99% 95%  97%   Weight change: 0.136 kg (4.8 oz) No intake or output data in the 24 hours ending 10/28/11 1424  Physical Exam: General: Awake, Oriented, No acute distress. HEENT: EOMI. Neck: Supple CV: S1 and S2 Lungs: Clear to ascultation bilaterally Abdomen: Soft, Nontender, Nondistended, +bowel sounds. Ext: Good pulses. Trace edema. GU: Abscess wound healing well, erythema and induration noted in the right groin.  Lab Results: Basic Metabolic Panel:  Lab 10/28/11 4098 10/26/11 0314 10/25/11 2050  NA 131* 131* 128*  K 3.7 3.4* 3.9  CL 96 95* 92*  CO2 24 20 20   GLUCOSE 310* 271* 395*  BUN 14 16 21   CREATININE 0.70 0.71 0.81  CALCIUM 9.4 9.0 9.4  MG -- -- --  PHOS -- -- --   Liver Function Tests:  Lab 10/25/11 2050  AST 5  ALT 5  ALKPHOS 85  BILITOT 0.4  PROT 6.8  ALBUMIN 3.4*   No results found for this basename: LIPASE:5,AMYLASE:5 in the last 168 hours No results found for this basename: AMMONIA:5 in the last 168 hours CBC:  Lab 10/28/11 0500 10/26/11 0314 10/25/11 2050  WBC 8.1 11.4* 13.6*  NEUTROABS -- -- --  HGB 14.6 14.5 15.6  HCT 41.3 40.6 42.4  MCV 84.3 84.2 84.1  PLT 220 209 224   Cardiac Enzymes:  Lab 10/26/11 1735 10/26/11 1015 10/26/11 0307  CKTOTAL 69 52 59  CKMB 2.0 1.8 2.4  CKMBINDEX -- -- --  TROPONINI <0.30 <0.30 <0.30   BNP (last 3 results) No results found for this basename: PROBNP:3 in the last 8760 hours CBG:  Lab 10/28/11 1122 10/28/11 0738 10/28/11 0422 10/27/11 2355 10/27/11 2007  GLUCAP 270* 350* 316*  306* 240*    Basename 10/26/11 0230  HGBA1C 14.3*   Other Labs: No components found with this basename: POCBNP:3 No results found for this basename: DDIMER:2 in the last 168 hours  Lab 10/28/11 0500  CHOL 240*  HDL 20*  LDLCALC UNABLE TO CALCULATE IF TRIGLYCERIDE OVER 400 mg/dL  TRIG 119*  CHOLHDL 14.7  LDLDIRECT --   No results found for this basename: TSH,T4TOTAL,FREET3,T3FREE,FREET4,THYROIDAB in the last 168 hours No results found for this basename: VITAMINB12:2,FOLATE:2,FERRITIN:2,TIBC:2,IRON:2,RETICCTPCT:2 in the last 168 hours  Micro Results: Recent Results (from the past 240 hour(s))  WOUND CULTURE     Status: Normal   Collection Time   10/25/11 10:57 PM      Component Value Range Status Comment   Specimen Description WOUND GROIN   Final    Special Requests NONE   Final    Gram Stain     Final    Value: NO WBC SEEN     NO SQUAMOUS EPITHELIAL CELLS SEEN     NO ORGANISMS SEEN   Culture     Final    Value: FEW GROUP B STREP(S.AGALACTIAE)ISOLATED     Note: TESTING AGAINST S. AGALACTIAE NOT ROUTINELY PERFORMED DUE TO PREDICTABILITY OF AMP/PEN/VAN SUSCEPTIBILITY.  Report Status 10/28/2011 FINAL   Final     Studies/Results: Nm Myocar Multi W/spect W/wall Motion / Ef  10/27/2011  *RADIOLOGY REPORT*  Clinical Data:  Chest pain, history of CAD  MYOCARDIAL IMAGING WITH SPECT (REST AND PHARMACOLOGIC-STRESS) GATED LEFT VENTRICULAR WALL MOTION STUDY LEFT VENTRICULAR EJECTION FRACTION  Technique:  Standard myocardial SPECT imaging was performed after resting intravenous injection of 10 mCi Tc-49m tetrofosmin. Subsequently, intravenous infusion of regadenoson was performed under the supervision of the Cardiology staff.  At peak effect of the drug, 30 mCi Tc-66m tetrofosmin was injected intravenously and standard myocardial SPECT  imaging was performed.  Quantitative gated imaging was also performed to evaluate left ventricular wall motion, and estimate left ventricular ejection  fraction.  Comparison:  None.  Findings: Lexiscan stress ECG showed no ischemic changes.  Gated images showed EF 59% with basal inferior hypokinesis.  On perfusion images, there was a medium-sized, moderate basal to mid inferior perfusion defect at rest.  There was a more prominent medium-sized, moderate basal to mid inferior perfusion defect with stress.  IMPRESSION: 1. Normal EF with basal inferior hypokinesis.  2. Partially reversible medium-sized, moderate basal to mid inferior perfusion defect.  This suggests prior inferior infarction with some peri-infarct ischemia.  Patient has a history of prior inferior MI.  Suspect that this is a low risk study.  Original Report Authenticated By: [email or web address]    Medications: I have reviewed the patient's current medications. Scheduled Meds:   . amoxicillin  500 mg Oral Q12H  . aspirin  324 mg Oral Once  . aspirin  81 mg Oral Daily  . bd getting started take home kit  1 kit Other Once  . doxycycline  100 mg Oral Q12H  . enoxaparin (LOVENOX) injection  40 mg Subcutaneous Q24H  . insulin aspart  0-20 Units Subcutaneous TID WC  . insulin aspart protamine-insulin aspart  25 Units Subcutaneous BID WC  . metoprolol succinate  25 mg Oral Daily  . pantoprazole  40 mg Oral BID AC  . sodium chloride  3 mL Intravenous Q12H  . sucralfate  1 g Oral TID WC & HS  . DISCONTD: atorvastatin  20 mg Oral q1800  . DISCONTD: bd getting started take home kit  1 kit Other Once  . DISCONTD: pantoprazole (PROTONIX) IV  40 mg Intravenous Q12H  . DISCONTD: vancomycin (VANCOCIN) 1500 mg IVPB  1,500 mg Intravenous Q12H   Continuous Infusions:  PRN Meds:.acetaminophen, alum & mag hydroxide-simeth, morphine injection, ondansetron (ZOFRAN) IV, ondansetron, technetium medronate, DISCONTD:  morphine injection  Assessment/Plan: Chest pain Patient had myocardial perfusion scan on 10/27/2011 which are normal EF with basal inferior hypokinesis, partially reversible mid sized, moderate  basal to mid inferior perfusion defect, Procardia which is likely due to old infarct and consistent with history of inferior MI.  Patient to followup with Dr. Eden Emms On 11/18/2011 at 2 p.m.Continue aspirin.  Hyperlipidemia Total cholesterol greater than 240.  Likely has metabolic syndrome from uncontrolled diabetes.  Patient has had intolerance to statins in the past.  Will recommend red yeast extract (pravastatin) at discharge (per cardiology recommendation).  Right groin cellulitis Discontinue vancomycin transitioned to Amoxicillin and doxycycline. Wound culture growing group B strep (S. Aglactiae). Group B strep (S. Aglactiae) should be sensitive to ampicillin/penicillin/vancomycin. Will arrange for home health nurse to help with dressing changes at discharge. Antibiotics since 10/26/2011.  Type 2 diabetes uncontrolled with complications History of noncompliance with metformin and glipizide.  Hemoglobin A1c 14.3 on 10/26/2011 suggesting an average  blood sugar of 364.Patient still has blood sugars in the 200s and 300s range.  Increase NovoLog 70/30 to 33 units sq bid. Appreciate diabetic nurse coordinator on educating the patient and teaching the patient on insulin administration.  May need outpatient diabetic followup at the diabetic center today.  Disposition Plan to discharge the patient in 24 hours if cellulitis continues to improve on oral antibiotics.   LOS: 3 days  Adrean Heitz A, MD 10/28/2011, 2:24 PM

## 2011-10-28 NOTE — Discharge Instructions (Addendum)
[facility].-[phone]   Appointment for Tuesday June 18 at 3:00 pm with NP Merrietta.  Please bring d/c summary from hospital visit, proof of income and list of meds. You will also see Cheri Rous for asistance with medications.

## 2011-10-29 DIAGNOSIS — I2510 Atherosclerotic heart disease of native coronary artery without angina pectoris: Secondary | ICD-10-CM

## 2011-10-29 DIAGNOSIS — E1165 Type 2 diabetes mellitus with hyperglycemia: Secondary | ICD-10-CM

## 2011-10-29 DIAGNOSIS — R079 Chest pain, unspecified: Secondary | ICD-10-CM

## 2011-10-29 DIAGNOSIS — L0291 Cutaneous abscess, unspecified: Secondary | ICD-10-CM

## 2011-10-29 DIAGNOSIS — IMO0001 Reserved for inherently not codable concepts without codable children: Secondary | ICD-10-CM

## 2011-10-29 DIAGNOSIS — L0390 Cellulitis, unspecified: Secondary | ICD-10-CM

## 2011-10-29 LAB — GLUCOSE, CAPILLARY
Glucose-Capillary: 241 mg/dL — ABNORMAL HIGH (ref 70–99)
Glucose-Capillary: 207 mg/dL — ABNORMAL HIGH (ref 70–99)

## 2011-10-29 LAB — BASIC METABOLIC PANEL
BUN: 12 mg/dL (ref 6–23)
CO2: 27 mEq/L (ref 19–32)
Calcium: 10.1 mg/dL (ref 8.4–10.5)
Chloride: 97 mEq/L (ref 96–112)
Creatinine, Ser: 0.77 mg/dL (ref 0.50–1.35)
GFR calc Af Amer: >90 mL/min (ref >90)
GFR calc non Af Amer: >90 mL/min (ref >90)
Glucose, Bld: 245 mg/dL — ABNORMAL HIGH (ref 70–99)
Potassium: 4.2 mEq/L (ref 3.5–5.1)
Sodium: 135 mEq/L (ref 135–145)

## 2011-10-29 LAB — CBC
HCT: 41.8 % (ref 39.0–52.0)
Hemoglobin: 14.6 g/dL (ref 13.0–17.0)
MCH: 29.5 pg (ref 26.0–34.0)
MCHC: 34.9 g/dL (ref 30.0–36.0)
MCV: 84.4 fL (ref 78.0–100.0)
Platelets: 228 10*3/uL (ref 150–400)
RBC: 4.95 MIL/uL (ref 4.22–5.81)
RDW: 12.7 % (ref 11.5–15.5)
WBC: 9 10*3/uL (ref 4.0–10.5)

## 2011-10-29 MED ORDER — UNABLE TO FIND: Status: DC

## 2011-10-29 MED ORDER — AMOXICILLIN 500 MG PO CAPS: 500.0000 mg | ORAL_CAPSULE | Freq: Two times a day (BID) | ORAL | Status: AC

## 2011-10-29 MED ORDER — SUCRALFATE 1 G PO TABS: 1.0000 g | ORAL_TABLET | Freq: Three times a day (TID) | ORAL | Status: DC

## 2011-10-29 MED ORDER — METOPROLOL TARTRATE 25 MG PO TABS: 25.0000 mg | ORAL_TABLET | Freq: Two times a day (BID) | ORAL | Status: DC

## 2011-10-29 MED ORDER — DOXYCYCLINE HYCLATE 100 MG PO TABS: 100.0000 mg | ORAL_TABLET | Freq: Two times a day (BID) | ORAL | Status: AC

## 2011-10-29 MED ORDER — INSULIN ASPART PROT & ASPART (70-30 MIX) 100 UNIT/ML ~~LOC~~ SUSP: 35.0000 [IU] | Freq: Two times a day (BID) | SUBCUTANEOUS | Status: DC

## 2011-10-29 MED ORDER — OMEPRAZOLE 20 MG PO CPDR: 20.0000 mg | DELAYED_RELEASE_CAPSULE | Freq: Every day | ORAL | Status: DC

## 2011-10-29 NOTE — Progress Notes (Signed)
Dressing change to right groin complete. Madelin Rear RN, CMSRN

## 2011-10-29 NOTE — Progress Notes (Signed)
Davon Randal Buba to be D/C'd Home per MD order.  Discussed with the patient the After Visit Summary and all questions fully answered. All IV's discontinued with no bleeding noted. All belongings returned to patient for patient to take home.   Susann Givens, RN, [facility] 10/29/2011 12:43 PM

## 2011-10-29 NOTE — Discharge Summary (Signed)
Discharge Summary  [name] [name] MR#: [id number]  DOB:[birth year]  Date of Admission: 10/25/2011 Date of Discharge: 10/29/2011  Patient's PCP: Josue Hector, MD  Attending Physician:Laquia Rosano A  Consults: Dr. Shirlee Latch, Cardiology  Discharge Diagnoses: Principal Problem:  *CHEST PAIN-UNSPECIFIED Active Problems:  HYPERLIPIDEMIA-MIXED  Essential hypertension, benign  Diabetes mellitus  Medically noncompliant  Cellulitis and abscess rt groin  CAD S/P percutaneous coronary angioplasty   Brief Admitting History and Physical 61 year old male with a history of diabetes he stopped taking his medication months ago, and coronary artery disease status post stenting, hyperlipidemia, hypertension who presents emergency Department with approximately 24 hours of substernal chest pain on 10/25/2011.  Discharge Medications Medication List  As of 10/29/2011 10:36 AM   STOP taking these medications         aspirin 81 MG chewable tablet         TAKE these medications         amoxicillin 500 MG capsule   Commonly known as: AMOXIL   Take 1 capsule (500 mg total) by mouth every 12 (twelve) hours.      aspirin EC 81 MG tablet   Take 81 mg by mouth daily.      doxycycline 100 MG tablet   Commonly known as: VIBRA-TABS   Take 1 tablet (100 mg total) by mouth every 12 (twelve) hours.      insulin aspart protamine-insulin aspart (70-30) 100 UNIT/ML injection   Commonly known as: NOVOLOG 70/30   Inject 35 Units into the skin 2 (two) times daily with a meal. Please give as vials.      metoprolol tartrate 25 MG tablet   Commonly known as: LOPRESSOR   Take 1 tablet (25 mg total) by mouth 2 (two) times daily.      omeprazole 20 MG capsule   Commonly known as: PRILOSEC   Take 1 capsule (20 mg total) by mouth daily.      sucralfate 1 G tablet   Commonly known as: CARAFATE   Take 1 tablet (1 g total) by mouth 4 (four) times daily -  with meals and at bedtime.      UNABLE TO FIND   60  insulin syringes with needles, for twice a day.            Hospital Course: Chest pain  [facility] evaluated the patient. Patient had myocardial perfusion scan on 10/27/2011 which are normal EF with basal inferior hypokinesis, partially reversible mid sized, moderate basal to mid inferior perfusion defect, inferior infarction is likely due to old infarct and consistent with history of inferior MI. Patient to followup with Dr. Eden Emms On 11/18/2011 at 2 p.m. Continue aspirin.   Hyperlipidemia  Total cholesterol greater than 240. Likely has metabolic syndrome from uncontrolled diabetes. Patient has had intolerance to statins in the past. Will recommend red yeast extract (pravastatin) at discharge (per cardiology recommendation). Start fish oil at discharge.   Right groin cellulitis  Initially on vancomycin which was transitioned to Amoxicillin and doxycycline. Wound culture growing group B strep (S. Aglactiae). Group B strep (S. Aglactiae) should be sensitive to ampicillin/penicillin/vancomycin. Will arrange for home health nurse to help with dressing changes at discharge. Antibiotics since 10/26/2011.  Define a 14 day course of antibiotics.  Type 2 diabetes uncontrolled with complications  History of noncompliance with metformin and glipizide. Hemoglobin A1c 14.3 on 10/26/2011 suggesting an average blood sugar of 364.Patient still has blood sugars in the 200s and 300s range.  Continue NovoLog 70/30 to 35  units sq bid, further titration as outpatient. Appreciate diabetic nurse coordinator on educating the patient and teaching the patient on insulin administration. Will need outpatient diabetic followup at the diabetic center in the future after discharge.  Day of Discharge BP 120/78  Pulse 68  Temp 97.3 F (36.3 C) (Oral)  Resp 16  Ht 6\' 2"  (1.88 m)  Wt 109.8 kg (242 lb 1 oz)  BMI 31.08 kg/m2  SpO2 97%  Results for orders placed during the hospital encounter of 10/25/11 (from the past  48 hour(s))  GLUCOSE, CAPILLARY     Status: Abnormal   Collection Time   10/27/11 11:13 AM      Component Value Range Comment   Glucose-Capillary 405 (*) 70 - 99 mg/dL    Comment 1 Notify RN     GLUCOSE, CAPILLARY     Status: Abnormal   Collection Time   10/27/11  4:50 PM      Component Value Range Comment   Glucose-Capillary 309 (*) 70 - 99 mg/dL    Comment 1 Notify RN     GLUCOSE, CAPILLARY     Status: Abnormal   Collection Time   10/27/11  8:07 PM      Component Value Range Comment   Glucose-Capillary 240 (*) 70 - 99 mg/dL    Comment 1 Notify RN     GLUCOSE, CAPILLARY     Status: Abnormal   Collection Time   10/27/11 11:55 PM      Component Value Range Comment   Glucose-Capillary 306 (*) 70 - 99 mg/dL    Comment 1 Notify RN     GLUCOSE, CAPILLARY     Status: Abnormal   Collection Time   10/28/11  4:22 AM      Component Value Range Comment   Glucose-Capillary 316 (*) 70 - 99 mg/dL    Comment 1 Notify RN     BASIC METABOLIC PANEL     Status: Abnormal   Collection Time   10/28/11  5:00 AM      Component Value Range Comment   Sodium 131 (*) 135 - 145 mEq/L    Potassium 3.7  3.5 - 5.1 mEq/L    Chloride 96  96 - 112 mEq/L    CO2 24  19 - 32 mEq/L    Glucose, Bld 310 (*) 70 - 99 mg/dL    BUN 14  6 - 23 mg/dL    Creatinine, Ser 1.61  0.50 - 1.35 mg/dL    Calcium 9.4  8.4 - 09.6 mg/dL    GFR calc non Af Amer >90  >90 mL/min    GFR calc Af Amer >90  >90 mL/min   CBC     Status: Normal   Collection Time   10/28/11  5:00 AM      Component Value Range Comment   WBC 8.1  4.0 - 10.5 K/uL    RBC 4.90  4.22 - 5.81 MIL/uL    Hemoglobin 14.6  13.0 - 17.0 g/dL    HCT 04.5  40.9 - 81.1 %    MCV 84.3  78.0 - 100.0 fL    MCH 29.8  26.0 - 34.0 pg    MCHC 35.4  30.0 - 36.0 g/dL    RDW 91.4  78.2 - 95.6 %    Platelets 220  150 - 400 K/uL   LIPID PANEL     Status: Abnormal   Collection Time   10/28/11  5:00 AM  Component Value Range Comment   Cholesterol 240 (*) 0 - 200 mg/dL     Triglycerides 161 (*) <150 mg/dL    HDL 20 (*) >09 mg/dL    Total CHOL/HDL Ratio 12.0      VLDL UNABLE TO CALCULATE IF TRIGLYCERIDE OVER 400 mg/dL  0 - 40 mg/dL    LDL Cholesterol UNABLE TO CALCULATE IF TRIGLYCERIDE OVER 400 mg/dL  0 - 99 mg/dL   GLUCOSE, CAPILLARY     Status: Abnormal   Collection Time   10/28/11  7:38 AM      Component Value Range Comment   Glucose-Capillary 350 (*) 70 - 99 mg/dL    Comment 1 Notify RN     GLUCOSE, CAPILLARY     Status: Abnormal   Collection Time   10/28/11 11:22 AM      Component Value Range Comment   Glucose-Capillary 270 (*) 70 - 99 mg/dL    Comment 1 Notify RN     GLUCOSE, CAPILLARY     Status: Abnormal   Collection Time   10/28/11  5:03 PM      Component Value Range Comment   Glucose-Capillary 248 (*) 70 - 99 mg/dL    Comment 1 Notify RN     GLUCOSE, CAPILLARY     Status: Abnormal   Collection Time   10/28/11  9:37 PM      Component Value Range Comment   Glucose-Capillary 155 (*) 70 - 99 mg/dL    Comment 1 Notify RN      Comment 2 Documented in Chart     CBC     Status: Normal   Collection Time   10/29/11  5:00 AM      Component Value Range Comment   WBC 9.0  4.0 - 10.5 K/uL    RBC 4.95  4.22 - 5.81 MIL/uL    Hemoglobin 14.6  13.0 - 17.0 g/dL    HCT 60.4  54.0 - 98.1 %    MCV 84.4  78.0 - 100.0 fL    MCH 29.5  26.0 - 34.0 pg    MCHC 34.9  30.0 - 36.0 g/dL    RDW 19.1  47.8 - 29.5 %    Platelets 228  150 - 400 K/uL   BASIC METABOLIC PANEL     Status: Abnormal   Collection Time   10/29/11  5:00 AM      Component Value Range Comment   Sodium 135  135 - 145 mEq/L    Potassium 4.2  3.5 - 5.1 mEq/L    Chloride 97  96 - 112 mEq/L    CO2 27  19 - 32 mEq/L    Glucose, Bld 245 (*) 70 - 99 mg/dL    BUN 12  6 - 23 mg/dL    Creatinine, Ser 6.21  0.50 - 1.35 mg/dL    Calcium 30.8  8.4 - 10.5 mg/dL    GFR calc non Af Amer >90  >90 mL/min    GFR calc Af Amer >90  >90 mL/min   GLUCOSE, CAPILLARY     Status: Abnormal   Collection Time    10/29/11  7:39 AM      Component Value Range Comment   Glucose-Capillary 241 (*) 70 - 99 mg/dL    Comment 1 Notify RN      Comment 2 Documented in Chart       Nm Myocar Multi W/spect W/wall Motion / Ef  10/27/2011  *RADIOLOGY REPORT*  Clinical Data:  Chest pain, history of CAD  MYOCARDIAL IMAGING WITH SPECT (REST AND PHARMACOLOGIC-STRESS) GATED LEFT VENTRICULAR WALL MOTION STUDY LEFT VENTRICULAR EJECTION FRACTION  Technique:  Standard myocardial SPECT imaging was performed after resting intravenous injection of 10 mCi Tc-24m tetrofosmin. Subsequently, intravenous infusion of regadenoson was performed under the supervision of the Cardiology staff.  At peak effect of the drug, 30 mCi Tc-63m tetrofosmin was injected intravenously and standard myocardial SPECT  imaging was performed.  Quantitative gated imaging was also performed to evaluate left ventricular wall motion, and estimate left ventricular ejection fraction.  Comparison:  None.  Findings: Lexiscan stress ECG showed no ischemic changes.  Gated images showed EF 59% with basal inferior hypokinesis.  On perfusion images, there was a medium-sized, moderate basal to mid inferior perfusion defect at rest.  There was a more prominent medium-sized, moderate basal to mid inferior perfusion defect with stress.  IMPRESSION: 1. Normal EF with basal inferior hypokinesis.  2. Partially reversible medium-sized, moderate basal to mid inferior perfusion defect.  This suggests prior inferior infarction with some peri-infarct ischemia.  Patient has a history of prior inferior MI.  Suspect that this is a low risk study.  Original Report Authenticated By: [email or web address]   Dg Chest Portable 1 View  10/25/2011  *RADIOLOGY REPORT*  Clinical Data: Chest pain  PORTABLE CHEST - 1 VIEW  Comparison: 09/13/2005  Findings: The apices are obscured by overlying structures.  No focal consolidation.  No pleural effusion or pneumothorax. Cardiomediastinal contours are mildly prominent to  upper normal, similar to prior.  No acute osseous finding visualized.  IMPRESSION: Cardiomediastinal contours upper normal limits on prominent however similar to prior.  No definite radiographic evidence of acute cardiopulmonary process.  Original Report Authenticated By: Waneta Martins, M.D.     Disposition: Home with home health RN to help with dressing changes.  Diet: Diabetic diet  Activity: Resume as tolerated   Follow-up Appts: Discharge Orders    Future Appointments: Provider: Department: Dept Phone: Center:   11/18/2011 2:00 PM Wendall Stade, MD [facility] [phone] [facility]     Future Orders Please Complete By Expires   Diet Carb Modified      Increase activity slowly      Discharge instructions      Comments:   Followup with Josue Hector, MD (PCP) in 1 week. Followup with Dr. Eden Emms (cardiology) on 11/18/2011 at 2 p.m. Start red yeast extract (1 tab) daily. Start fish oil 1 tab twice daily.      TESTS THAT NEED FOLLOW-UP None  Time spent on discharge, talking to the patient, and coordinating care: 35 mins.   Signed: Cristal Ford, MD 10/29/2011, 10:36 AM

## 2011-10-29 NOTE — Progress Notes (Signed)
Subjective: Denies any chest pain or shortness of breath.  No other specific complaints.  Objective: Vital signs in last 24 hours: Filed Vitals:   10/28/11 1358 10/28/11 1748 10/29/11 0436 10/29/11 0500  BP: 126/72 122/80 104/62   Pulse: 64 67 65   Temp: 98.4 F (36.9 C) 98.2 F (36.8 C) 97.3 F (36.3 C)   TempSrc: Oral Oral Oral   Resp: 20 16 16    Height:      Weight:    109.8 kg (242 lb 1 oz)  SpO2: 97% 97% 97%    Weight change: 0.075 kg (2.6 oz)  Intake/Output Summary (Last 24 hours) at 10/29/11 1021 Last data filed at 10/29/11 1011  Gross per 24 hour  Intake    363 ml  Output      0 ml  Net    363 ml    Physical Exam: General: Awake, Oriented, No acute distress. HEENT: EOMI. Neck: Supple CV: S1 and S2 Lungs: Clear to ascultation bilaterally Abdomen: Soft, Nontender, Nondistended, +bowel sounds. Ext: Good pulses. Trace edema. GU: Abscess wound healing well, erythema and induration noted in the right groin.  Lab Results: Basic Metabolic Panel:  Lab 10/29/11 1610 10/28/11 0500 10/26/11 0314 10/25/11 2050  NA 135 131* 131* 128*  K 4.2 3.7 3.4* 3.9  CL 97 96 95* 92*  CO2 27 24 20 20   GLUCOSE 245* 310* 271* 395*  BUN 12 14 16 21   CREATININE 0.77 0.70 0.71 0.81  CALCIUM 10.1 9.4 9.0 9.4  MG -- -- -- --  PHOS -- -- -- --   Liver Function Tests:  Lab 10/25/11 2050  AST 5  ALT 5  ALKPHOS 85  BILITOT 0.4  PROT 6.8  ALBUMIN 3.4*   No results found for this basename: LIPASE:5,AMYLASE:5 in the last 168 hours No results found for this basename: AMMONIA:5 in the last 168 hours CBC:  Lab 10/29/11 0500 10/28/11 0500 10/26/11 0314 10/25/11 2050  WBC 9.0 8.1 11.4* 13.6*  NEUTROABS -- -- -- --  HGB 14.6 14.6 14.5 15.6  HCT 41.8 41.3 40.6 42.4  MCV 84.4 84.3 84.2 84.1  PLT 228 220 209 224   Cardiac Enzymes:  Lab 10/26/11 1735 10/26/11 1015 10/26/11 0307  CKTOTAL 69 52 59  CKMB 2.0 1.8 2.4  CKMBINDEX -- -- --  TROPONINI <0.30 <0.30 <0.30   BNP (last 3  results) No results found for this basename: PROBNP:3 in the last 8760 hours CBG:  Lab 10/29/11 0739 10/28/11 2137 10/28/11 1703 10/28/11 1122 10/28/11 0738  GLUCAP 241* 155* 248* 270* 350*   No results found for this basename: HGBA1C:5 in the last 72 hours Other Labs: No components found with this basename: POCBNP:3 No results found for this basename: DDIMER:2 in the last 168 hours  Lab 10/28/11 0500  CHOL 240*  HDL 20*  LDLCALC UNABLE TO CALCULATE IF TRIGLYCERIDE OVER 400 mg/dL  TRIG 960*  CHOLHDL 45.4  LDLDIRECT --   No results found for this basename: TSH,T4TOTAL,FREET3,T3FREE,FREET4,THYROIDAB in the last 168 hours No results found for this basename: VITAMINB12:2,FOLATE:2,FERRITIN:2,TIBC:2,IRON:2,RETICCTPCT:2 in the last 168 hours  Micro Results: Recent Results (from the past 240 hour(s))  WOUND CULTURE     Status: Normal   Collection Time   10/25/11 10:57 PM      Component Value Range Status Comment   Specimen Description WOUND GROIN   Final    Special Requests NONE   Final    Gram Stain     Final  Value: NO WBC SEEN     NO SQUAMOUS EPITHELIAL CELLS SEEN     NO ORGANISMS SEEN   Culture     Final    Value: FEW GROUP B STREP(S.AGALACTIAE)ISOLATED     Note: TESTING AGAINST S. AGALACTIAE NOT ROUTINELY PERFORMED DUE TO PREDICTABILITY OF AMP/PEN/VAN SUSCEPTIBILITY.   Report Status 10/28/2011 FINAL   Final     Studies/Results: Nm Myocar Multi W/spect W/wall Motion / Ef  10/27/2011  *RADIOLOGY REPORT*  Clinical Data:  Chest pain, history of CAD  MYOCARDIAL IMAGING WITH SPECT (REST AND PHARMACOLOGIC-STRESS) GATED LEFT VENTRICULAR WALL MOTION STUDY LEFT VENTRICULAR EJECTION FRACTION  Technique:  Standard myocardial SPECT imaging was performed after resting intravenous injection of 10 mCi Tc-23m tetrofosmin. Subsequently, intravenous infusion of regadenoson was performed under the supervision of the Cardiology staff.  At peak effect of the drug, 30 mCi Tc-81m tetrofosmin was  injected intravenously and standard myocardial SPECT  imaging was performed.  Quantitative gated imaging was also performed to evaluate left ventricular wall motion, and estimate left ventricular ejection fraction.  Comparison:  None.  Findings: Lexiscan stress ECG showed no ischemic changes.  Gated images showed EF 59% with basal inferior hypokinesis.  On perfusion images, there was a medium-sized, moderate basal to mid inferior perfusion defect at rest.  There was a more prominent medium-sized, moderate basal to mid inferior perfusion defect with stress.  IMPRESSION: 1. Normal EF with basal inferior hypokinesis.  2. Partially reversible medium-sized, moderate basal to mid inferior perfusion defect.  This suggests prior inferior infarction with some peri-infarct ischemia.  Patient has a history of prior inferior MI.  Suspect that this is a low risk study.  Original Report Authenticated By: [email or web address]    Medications: I have reviewed the patient's current medications. Scheduled Meds:    . amoxicillin  500 mg Oral Q12H  . aspirin  324 mg Oral Once  . aspirin  81 mg Oral Daily  . doxycycline  100 mg Oral Q12H  . enoxaparin (LOVENOX) injection  40 mg Subcutaneous Q24H  . insulin aspart  0-20 Units Subcutaneous TID WC  . insulin aspart protamine-insulin aspart  33 Units Subcutaneous BID WC  . metoprolol succinate  25 mg Oral Daily  . pantoprazole  40 mg Oral BID AC  . sodium chloride  3 mL Intravenous Q12H  . sucralfate  1 g Oral TID WC & HS  . DISCONTD: insulin aspart protamine-insulin aspart  25 Units Subcutaneous BID WC  . DISCONTD: vancomycin (VANCOCIN) 1500 mg IVPB  1,500 mg Intravenous Q12H   Continuous Infusions:  PRN Meds:.acetaminophen, alum & mag hydroxide-simeth, morphine injection, ondansetron (ZOFRAN) IV, ondansetron  Assessment/Plan: Chest pain Patient had myocardial perfusion scan on 10/27/2011 which are normal EF with basal inferior hypokinesis, partially reversible mid sized,  moderate basal to mid inferior perfusion defect, inferior infarction is likely due to old infarct and consistent with history of inferior MI.  Patient to followup with Dr. Eden Emms On 11/18/2011 at 2 p.m. Continue aspirin.  Hyperlipidemia Total cholesterol greater than 240.  Likely has metabolic syndrome from uncontrolled diabetes.  Patient has had intolerance to statins in the past.  Will recommend red yeast extract (pravastatin) at discharge (per cardiology recommendation). Start fish oil at discharge.  Right groin cellulitis Discontinue vancomycin transitioned to Amoxicillin and doxycycline. Wound culture growing group B strep (S. Aglactiae). Group B strep (S. Aglactiae) should be sensitive to ampicillin/penicillin/vancomycin. Will arrange for home health nurse to help with dressing changes at discharge. Antibiotics since 10/26/2011.  Type 2 diabetes uncontrolled with complications History of noncompliance with metformin and glipizide.  Hemoglobin A1c 14.3 on 10/26/2011 suggesting an average blood sugar of 364.Patient still has blood sugars in the 200s and 300s range.  Increase NovoLog 70/30 to 33 units sq bid. Appreciate diabetic nurse coordinator on educating the patient and teaching the patient on insulin administration.  Will need outpatient diabetic followup at the diabetic center today.  Disposition Discharge the patient today.    LOS: 4 days  Zanayah Shadowens A, MD 10/29/2011, 10:21 AM

## 2011-11-17 ENCOUNTER — Encounter: Payer: Self-pay | Admitting: *Deleted

## 2011-11-17 ENCOUNTER — Encounter: Payer: Self-pay | Admitting: Cardiovascular Disease

## 2011-11-18 ENCOUNTER — Encounter: Payer: 59 | Admitting: Cardiovascular Disease

## 2011-12-01 ENCOUNTER — Other Ambulatory Visit: Payer: Self-pay | Admitting: Internal Medicine

## 2011-12-16 ENCOUNTER — Other Ambulatory Visit: Payer: Self-pay | Admitting: Internal Medicine

## 2013-05-18 HISTORY — PX: BACK SURGERY: SHX140

## 2013-05-31 ENCOUNTER — Ambulatory Visit (INDEPENDENT_AMBULATORY_CARE_PROVIDER_SITE_OTHER): Payer: Medicare Other | Admitting: Family Medicine

## 2013-05-31 ENCOUNTER — Encounter (INDEPENDENT_AMBULATORY_CARE_PROVIDER_SITE_OTHER): Payer: Self-pay

## 2013-05-31 ENCOUNTER — Encounter: Payer: Self-pay | Admitting: Family Medicine

## 2013-05-31 VITALS — BP 151/84 | HR 73 | Temp 98.7°F | Ht 74.0 in | Wt 259.0 lb

## 2013-05-31 DIAGNOSIS — I10 Essential (primary) hypertension: Secondary | ICD-10-CM

## 2013-05-31 DIAGNOSIS — E785 Hyperlipidemia, unspecified: Secondary | ICD-10-CM

## 2013-05-31 DIAGNOSIS — R109 Unspecified abdominal pain: Secondary | ICD-10-CM

## 2013-05-31 DIAGNOSIS — E119 Type 2 diabetes mellitus without complications: Secondary | ICD-10-CM

## 2013-05-31 DIAGNOSIS — M25569 Pain in unspecified knee: Secondary | ICD-10-CM

## 2013-05-31 DIAGNOSIS — R350 Frequency of micturition: Secondary | ICD-10-CM

## 2013-05-31 DIAGNOSIS — E559 Vitamin D deficiency, unspecified: Secondary | ICD-10-CM

## 2013-05-31 DIAGNOSIS — Z0000 Encounter for general adult medical examination without abnormal findings: Secondary | ICD-10-CM

## 2013-05-31 LAB — POCT GLYCOSYLATED HEMOGLOBIN (HGB A1C): Hemoglobin A1C: 12

## 2013-05-31 LAB — POCT CBC
Granulocyte percent: 74.2 %G (ref 37–80)
HCT, POC: 50.2 % (ref 43.5–53.7)
Hemoglobin: 16 g/dL (ref 14.1–18.1)
Lymph, poc: 2.3 (ref 0.6–3.4)
MCH, POC: 26.9 pg — AB (ref 27–31.2)
MCHC: 31.9 g/dL (ref 31.8–35.4)
MCV: 84.2 fL (ref 80–97)
MPV: 7.8 fL (ref 0–99.8)
POC Granulocyte: 7.3 — AB (ref 2–6.9)
POC LYMPH PERCENT: 23.4 %L (ref 10–50)
Platelet Count, POC: 278 10*3/uL (ref 142–424)
RBC: 6 M/uL (ref 4.69–6.13)
RDW, POC: 13 %
WBC: 9.8 10*3/uL (ref 4.6–10.2)

## 2013-05-31 LAB — POCT UA - MICROALBUMIN: Microalbumin Ur, POC: 100 mg/L

## 2013-05-31 LAB — GLUCOSE, POCT (MANUAL RESULT ENTRY): POC Glucose: 279 mg/dl — AB (ref 70–99)

## 2013-05-31 MED ORDER — BACLOFEN 20 MG PO TABS: 20.0000 mg | ORAL_TABLET | Freq: Three times a day (TID) | ORAL | Status: DC

## 2013-05-31 NOTE — Progress Notes (Signed)
Subjective:    Patient ID: [name] [name], male    DOB: August 31, 1950, 63 y.o.   MRN: [id number]  HPI This 63 y.o. male presents for evaluation of fatigue and he states he blames this on diabetes.  He fatigue comes in spells.  His blood sugars non fasting are 160-180.  He has blood sugars around  120 and he has hypoglycemia sx's.  He has been having left lower quadrant spasms. He is having some right shoulder discomfort.  He has hx of CAD and s/p PTCA and coronary artery stent.  He is a smokerAnd has cut down.  He has been taking levemir insulin and is taking 35 units bid. He denies any chest pain or SOB.  He has hx of GERD.  He does have problems with not sleeping well and states he has to get up to urinate a lot.  He does drink a lot of fluids during the day.  He has taken b12 in the past and was told to stop because he didn't need it.   Review of Systems C/o weakness No chest pain, SOB, HA, dizziness, vision change, N/V, diarrhea, constipation, dysuria, urinary urgency or frequency, myalgias, arthralgias or rash.     Objective:   Physical Exam  Vital signs noted  Well developed well nourished male.  HEENT - Head atraumatic Normocephalic                Eyes - PERRLA, Conjuctiva - clear Sclera- Clear EOMI                Ears - EAC's Wnl TM's Wnl Gross Hearing WNL                Nose - Nares patent                 Throat - oropharanx wnl Respiratory - Lungs CTA bilateral Cardiac - RRR S1 and S2 without murmur GI - Abdomen soft Nontender and bowel sounds active x 4 Extremities - No edema. Neuro - Grossly intact. Feet - Decreased sensation in right foot with palpation, no palp DP or PT pusle, foot warm without Ulcers fissures, or callus.  Results for orders placed in visit on 05/31/13  POCT CBC      Result Value Range   WBC 9.8  4.6 - 10.2 K/uL   Lymph, poc 2.3  0.6 - 3.4   POC LYMPH PERCENT 23.4  10 - 50 %L   POC Granulocyte 7.3 (*) 2 - 6.9   Granulocyte percent 74.2  37 - 80  %G   RBC 6.0  4.69 - 6.13 M/uL   Hemoglobin 16.0  14.1 - 18.1 g/dL   HCT, POC 50.2  43.5 - 53.7 %   MCV 84.2  80 - 97 fL   MCH, POC 26.9 (*) 27 - 31.2 pg   MCHC 31.9  31.8 - 35.4 g/dL   RDW, POC 13.0     Platelet Count, POC 278.0  142 - 424 K/uL   MPV 7.8  0 - 99.8 fL  POCT UA - MICROALBUMIN      Result Value Range   Microalbumin Ur, POC 100    POCT GLYCOSYLATED HEMOGLOBIN (HGB A1C)      Result Value Range   Hemoglobin A1C 12.0 %    GLUCOSE, POCT (MANUAL RESULT ENTRY)      Result Value Range   POC Glucose 279 (*) 70 - 99 mg/dl      Assessment & Plan:  Diabetes - Plan: POCT CBC, POCT UA - Microalbumin, POCT glycosylated hemoglobin (Hb A1C), POCT glucose (manual entry) Hgbaic is elevated and recommend he follow up with [facility] for diabetes appointment.  Urine frequency - Plan: PSA, total and free.  Discussed not drinking so much fluids at hs  And need to get diabetes under control.  Essential hypertension, benign - Plan: POCT CBC, CMP14+EGFR, Lipid panel, Thyroid Panel With TSH  Neuropathy - Increase Neurontin to 322m po tid  Hyperlipidemia - Plan: POCT CBC, CMP14+EGFR, Lipid panel  Abdominal spasms - Plan: baclofen (LIORESAL) 20 MG tablet  Pain in joint, lower leg - Plan: UKoreaVenous Img Lower Bilateral  Follow up in 3 months  WLysbeth PennerFNP

## 2013-05-31 NOTE — Patient Instructions (Signed)

## 2013-06-01 LAB — LIPID PANEL
Chol/HDL Ratio: 10 ratio units — ABNORMAL HIGH (ref 0.0–5.0)
Cholesterol, Total: 219 mg/dL — ABNORMAL HIGH (ref 100–199)
HDL: 22 mg/dL — ABNORMAL LOW (ref >39)
Triglycerides: 604 mg/dL (ref 0–149)

## 2013-06-01 LAB — CMP14+EGFR
ALT: 20 IU/L (ref 0–44)
AST: 11 IU/L (ref 0–40)
Albumin/Globulin Ratio: 2 (ref 1.1–2.5)
Albumin: 4.6 g/dL (ref 3.6–4.8)
Alkaline Phosphatase: 77 IU/L (ref 39–117)
BUN/Creatinine Ratio: 19 (ref 10–22)
BUN: 20 mg/dL (ref 8–27)
CO2: 23 mmol/L (ref 18–29)
Calcium: 10.1 mg/dL (ref 8.6–10.2)
Chloride: 96 mmol/L — ABNORMAL LOW (ref 97–108)
Creatinine, Ser: 1.06 mg/dL (ref 0.76–1.27)
GFR calc Af Amer: 87 mL/min/{1.73_m2} (ref >59)
GFR calc non Af Amer: 75 mL/min/{1.73_m2} (ref >59)
Globulin, Total: 2.3 g/dL (ref 1.5–4.5)
Glucose: 316 mg/dL — ABNORMAL HIGH (ref 65–99)
Potassium: 4.4 mmol/L (ref 3.5–5.2)
Sodium: 138 mmol/L (ref 134–144)
Total Bilirubin: 0.5 mg/dL (ref 0.0–1.2)
Total Protein: 6.9 g/dL (ref 6.0–8.5)

## 2013-06-01 LAB — PSA, TOTAL AND FREE
PSA, Free Pct: 17.3 %
PSA, Free: 0.19 ng/mL
PSA: 1.1 ng/mL (ref 0.0–4.0)

## 2013-06-01 LAB — THYROID PANEL WITH TSH
Free Thyroxine Index: 1.8 (ref 1.2–4.9)
T3 Uptake Ratio: 35 % (ref 24–39)
T4, Total: 5.2 ug/dL (ref 4.5–12.0)
TSH: 1.87 u[IU]/mL (ref 0.450–4.500)

## 2013-06-05 ENCOUNTER — Ambulatory Visit (INDEPENDENT_AMBULATORY_CARE_PROVIDER_SITE_OTHER): Payer: Medicare Other | Admitting: Pharmacist

## 2013-06-05 ENCOUNTER — Encounter: Payer: Self-pay | Admitting: Pharmacist

## 2013-06-05 VITALS — BP 138/78 | HR 70 | Ht 74.0 in | Wt 260.0 lb

## 2013-06-05 DIAGNOSIS — E785 Hyperlipidemia, unspecified: Secondary | ICD-10-CM

## 2013-06-05 DIAGNOSIS — E119 Type 2 diabetes mellitus without complications: Secondary | ICD-10-CM

## 2013-06-05 NOTE — Progress Notes (Signed)
Diabetes Visit Chief Complaint:   Chief Complaint  Patient presents with  . Diabetes     Filed Vitals:   06/05/13 1000  BP: 138/78  Pulse: 70     HPI:  Patient has recently transferred back to our office after many years.  He just recently qualified for medicare benefits and now has insurance (last year he went to [facility] He has had type 2 Dm for several years.  Currently he is taking Janumet 50/500mg  1 tablet BID and Levemir 35 untis BID.  He also has hypertriglyceriemia - mostly likely worsened by uncontrolled type 2 DM and diet.  He is currently taking pravachol 20mg  2 tablet daily, Fish Oil 1 capsule daily and Red Yeast Rice daily.    Exam Edema:  negatvie  Polyuria:  postivie  Polydipsia:  positive Polyphagia:  negative  BMI:  Body mass index is 33.37 kg/(m^2).   Weight changes:  stable General Appearance:  alert, oriented, no acute distress and obese Mood/Affect:  normal   Low fat/carbohydrate diet?  No Nicotine Abuse?  Yes Medication Compliance?  Yes Exercise?  No Alcohol Abuse?  No  Home BG Monitoring:  Checking only occasionally (1-2 times per week) Average:  250's     Lab Results  Component Value Date   HGBA1C 12.0 % 05/31/2013    No results found for this basenameDerl [name]    Lab Results  Component Value Date   CHOL 240* 10/28/2011   HDL 22* 05/31/2013   LDLCALC Comment 05/31/2013   TRIG 604* 05/31/2013   CHOLHDL 10.0* 05/31/2013      Assessment: 1.  Diabetes.  uncontrolled 2.  Blood Pressure.  Controlled today 3.  Lipids.  Uncontrolled - Tg too high and unable to assess LDL   Recommendations: 1.  Medication recommendations at this time are as follows:    Plan to increase Janumet to 50/1000mg  BID but patient just received 3 months supply of 50/500mg  so Rx was written for metformin XR 500mg  BID with food #60/1RF  Increase Levemir to 40 units BID 2.  Reviewed HBG goals:  Fasting 80-130 and 1-2 hour post prandial <180.   Patient is instructed to check BG 1 times per day.    3.  BP goal < 140/80. 4.  LDL goal of < 100, HDL > 40 and TG < 150. 5.  Eye Exam yearly and Dental Exam every 6 months. 6.  Dietary recommendations:  Spent at least 30 minutes reviewing diet and discussing CHO content of various foods and recommended daily CHO recommendations and serving sizes. 7.  Physical Activity recommendations:  Increase as able - just encouraged to move more 8.  Return to clinic in 4-6 wks   Time spent counseling patient:  60 minutes  Referring provider:  Roosvelt Harps, PharmD, CPP

## 2013-07-05 ENCOUNTER — Telehealth: Payer: Self-pay | Admitting: *Deleted

## 2013-07-05 ENCOUNTER — Telehealth: Payer: Self-pay | Admitting: Family Medicine

## 2013-07-05 NOTE — Telephone Encounter (Signed)
I am suspicious that itching is related to metformin XR as he has been taking Janumet for awhile but he might be allergic to something in just the metformin XR tablet.  Recommended that he stop metformin, take either allegra or benadryl and use hydrocortisone cream on rash.  If rash worsens then come to office or if not better by 07/10/13 come to office.  Will discuss options for BG once rash improves (consider invokana).

## 2013-07-05 NOTE — Telephone Encounter (Signed)
PUT CALL TO TRIAGE-POSSIBLE ALLERGIC REACTION TO METFORMIN.  RS

## 2013-07-05 NOTE — Telephone Encounter (Signed)
PT STATES THAT HE CAN NOT TAKE THE METFORMIN - IT CAUSES HIM TO ITCH. HE HAS BEEN SCRATCHING ALL OVER. HE WANT TO STOP THIS- CAN YOU CALL AND DISCUSS. HE DOESN'T THINK HE NEEDS TO COME IN FOR THE RASH (NOT VISIBLE- BUT YOU CAN FEEL THEM UNDER THE SKIN)

## 2013-07-06 ENCOUNTER — Ambulatory Visit: Payer: Medicare Other

## 2013-07-12 ENCOUNTER — Telehealth: Payer: Self-pay | Admitting: Family Medicine

## 2013-07-12 MED ORDER — SITAGLIPTIN PHOSPHATE 100 MG PO TABS: 100.0000 mg | ORAL_TABLET | Freq: Every day | ORAL | Status: DC

## 2013-07-12 MED ORDER — CANAGLIFLOZIN 100 MG PO TABS: 1.0000 | ORAL_TABLET | Freq: Every day | ORAL | Status: DC

## 2013-07-12 NOTE — Telephone Encounter (Signed)
Itching has improved since stopping metformin but he still have a little after taking JanuMet.  Recommend discontinue Janumet.  Start [place] 100mg  1 tablet daily and Invokana 100mg  1 tablet each morning.  Left 1 month of samples at front desk.

## 2013-07-19 ENCOUNTER — Encounter: Payer: Self-pay | Admitting: *Deleted

## 2013-08-07 ENCOUNTER — Emergency Department (HOSPITAL_COMMUNITY): Payer: Medicare Other

## 2013-08-07 ENCOUNTER — Ambulatory Visit (INDEPENDENT_AMBULATORY_CARE_PROVIDER_SITE_OTHER): Payer: Medicare Other | Admitting: Family Medicine

## 2013-08-07 ENCOUNTER — Other Ambulatory Visit: Payer: Self-pay

## 2013-08-07 ENCOUNTER — Encounter: Payer: Self-pay | Admitting: Family Medicine

## 2013-08-07 ENCOUNTER — Inpatient Hospital Stay (HOSPITAL_COMMUNITY)
Admission: EM | Admit: 2013-08-07 | Discharge: 2013-08-09 | DRG: 312 | Disposition: A | Discharge status: Home / Self Care | Payer: Medicare Other | Attending: Internal Medicine | Admitting: Internal Medicine

## 2013-08-07 ENCOUNTER — Encounter (HOSPITAL_COMMUNITY): Payer: Self-pay | Admitting: Emergency Medicine

## 2013-08-07 VITALS — BP 137/80 | HR 77 | Temp 97.1°F | Ht 74.0 in | Wt 253.8 lb

## 2013-08-07 DIAGNOSIS — R61 Generalized hyperhidrosis: Secondary | ICD-10-CM

## 2013-08-07 DIAGNOSIS — Z91199 Patient's noncompliance with other medical treatment and regimen due to unspecified reason: Secondary | ICD-10-CM

## 2013-08-07 DIAGNOSIS — K219 Gastro-esophageal reflux disease without esophagitis: Secondary | ICD-10-CM | POA: Diagnosis present

## 2013-08-07 DIAGNOSIS — I252 Old myocardial infarction: Secondary | ICD-10-CM

## 2013-08-07 DIAGNOSIS — E119 Type 2 diabetes mellitus without complications: Secondary | ICD-10-CM | POA: Diagnosis present

## 2013-08-07 DIAGNOSIS — R55 Syncope and collapse: Secondary | ICD-10-CM

## 2013-08-07 DIAGNOSIS — Z794 Long term (current) use of insulin: Secondary | ICD-10-CM

## 2013-08-07 DIAGNOSIS — E782 Mixed hyperlipidemia: Secondary | ICD-10-CM | POA: Diagnosis present

## 2013-08-07 DIAGNOSIS — I2510 Atherosclerotic heart disease of native coronary artery without angina pectoris: Secondary | ICD-10-CM | POA: Diagnosis present

## 2013-08-07 DIAGNOSIS — E1159 Type 2 diabetes mellitus with other circulatory complications: Secondary | ICD-10-CM | POA: Diagnosis present

## 2013-08-07 DIAGNOSIS — Z9119 Patient's noncompliance with other medical treatment and regimen: Secondary | ICD-10-CM

## 2013-08-07 DIAGNOSIS — Z7982 Long term (current) use of aspirin: Secondary | ICD-10-CM

## 2013-08-07 DIAGNOSIS — R519 Headache, unspecified: Secondary | ICD-10-CM | POA: Diagnosis present

## 2013-08-07 DIAGNOSIS — R51 Headache: Secondary | ICD-10-CM

## 2013-08-07 DIAGNOSIS — Z79899 Other long term (current) drug therapy: Secondary | ICD-10-CM

## 2013-08-07 DIAGNOSIS — F17200 Nicotine dependence, unspecified, uncomplicated: Secondary | ICD-10-CM | POA: Diagnosis present

## 2013-08-07 DIAGNOSIS — Z8249 Family history of ischemic heart disease and other diseases of the circulatory system: Secondary | ICD-10-CM

## 2013-08-07 DIAGNOSIS — R079 Chest pain, unspecified: Secondary | ICD-10-CM

## 2013-08-07 DIAGNOSIS — E669 Obesity, unspecified: Secondary | ICD-10-CM | POA: Diagnosis present

## 2013-08-07 DIAGNOSIS — E118 Type 2 diabetes mellitus with unspecified complications: Secondary | ICD-10-CM | POA: Diagnosis present

## 2013-08-07 DIAGNOSIS — Z888 Allergy status to other drugs, medicaments and biological substances status: Secondary | ICD-10-CM

## 2013-08-07 DIAGNOSIS — Z820 Family history of epilepsy and other diseases of the nervous system: Secondary | ICD-10-CM

## 2013-08-07 DIAGNOSIS — L0291 Cutaneous abscess, unspecified: Secondary | ICD-10-CM

## 2013-08-07 DIAGNOSIS — I10 Essential (primary) hypertension: Secondary | ICD-10-CM | POA: Diagnosis present

## 2013-08-07 DIAGNOSIS — E1169 Type 2 diabetes mellitus with other specified complication: Secondary | ICD-10-CM | POA: Diagnosis present

## 2013-08-07 DIAGNOSIS — Z833 Family history of diabetes mellitus: Secondary | ICD-10-CM

## 2013-08-07 DIAGNOSIS — E785 Hyperlipidemia, unspecified: Secondary | ICD-10-CM

## 2013-08-07 DIAGNOSIS — L0390 Cellulitis, unspecified: Secondary | ICD-10-CM

## 2013-08-07 DIAGNOSIS — R011 Cardiac murmur, unspecified: Secondary | ICD-10-CM

## 2013-08-07 DIAGNOSIS — Z9861 Coronary angioplasty status: Secondary | ICD-10-CM

## 2013-08-07 DIAGNOSIS — I152 Hypertension secondary to endocrine disorders: Secondary | ICD-10-CM | POA: Diagnosis present

## 2013-08-07 LAB — COMPREHENSIVE METABOLIC PANEL
ALT: 19 U/L (ref 0–53)
AST: 15 U/L (ref 0–37)
Albumin: 4.1 g/dL (ref 3.5–5.2)
Alkaline Phosphatase: 65 U/L (ref 39–117)
BUN: 26 mg/dL — ABNORMAL HIGH (ref 6–23)
CO2: 22 mEq/L (ref 19–32)
Calcium: 10.3 mg/dL (ref 8.4–10.5)
Chloride: 99 mEq/L (ref 96–112)
Creatinine, Ser: 0.91 mg/dL (ref 0.50–1.35)
GFR calc Af Amer: >90 mL/min (ref >90)
GFR calc non Af Amer: 89 mL/min — ABNORMAL LOW (ref >90)
Glucose, Bld: 256 mg/dL — ABNORMAL HIGH (ref 70–99)
Potassium: 4.8 mEq/L (ref 3.7–5.3)
Sodium: 138 mEq/L (ref 137–147)
Total Bilirubin: 0.3 mg/dL (ref 0.3–1.2)
Total Protein: 7.9 g/dL (ref 6.0–8.3)

## 2013-08-07 LAB — LIPID PANEL
Cholesterol: 215 mg/dL — ABNORMAL HIGH (ref 0–200)
HDL: 22 mg/dL — ABNORMAL LOW (ref >39)
LDL Cholesterol: UNDETERMINED mg/dL (ref 0–99)
Total CHOL/HDL Ratio: 9.8 RATIO
Triglycerides: 475 mg/dL — ABNORMAL HIGH (ref <150)
VLDL: UNDETERMINED mg/dL (ref 0–40)

## 2013-08-07 LAB — GLUCOSE, CAPILLARY: Glucose-Capillary: 184 mg/dL — ABNORMAL HIGH (ref 70–99)

## 2013-08-07 LAB — CBC WITH DIFFERENTIAL/PLATELET
Basophils Absolute: 0.2 10*3/uL — ABNORMAL HIGH (ref 0.0–0.1)
Basophils Relative: 1 % (ref 0–1)
Eosinophils Absolute: 0.2 10*3/uL (ref 0.0–0.7)
Eosinophils Relative: 2 % (ref 0–5)
HCT: 48.5 % (ref 39.0–52.0)
Hemoglobin: 17.7 g/dL — ABNORMAL HIGH (ref 13.0–17.0)
Lymphocytes Relative: 23 % (ref 12–46)
Lymphs Abs: 2.7 10*3/uL (ref 0.7–4.0)
MCH: 30.4 pg (ref 26.0–34.0)
MCHC: 36.5 g/dL — ABNORMAL HIGH (ref 30.0–36.0)
MCV: 83.2 fL (ref 78.0–100.0)
Monocytes Absolute: 0.9 10*3/uL (ref 0.1–1.0)
Monocytes Relative: 7 % (ref 3–12)
Neutro Abs: 8.1 10*3/uL — ABNORMAL HIGH (ref 1.7–7.7)
Neutrophils Relative %: 67 % (ref 43–77)
Platelets: 236 10*3/uL (ref 150–400)
RBC: 5.83 MIL/uL — ABNORMAL HIGH (ref 4.22–5.81)
RDW: 12.7 % (ref 11.5–15.5)
WBC: 12.1 10*3/uL — ABNORMAL HIGH (ref 4.0–10.5)

## 2013-08-07 LAB — KETONES, QUALITATIVE: Acetone, Bld: NEGATIVE

## 2013-08-07 LAB — URINE MICROSCOPIC-ADD ON

## 2013-08-07 LAB — URINALYSIS, ROUTINE W REFLEX MICROSCOPIC
Bilirubin Urine: NEGATIVE
Glucose, UA: >1000 mg/dL — AB
Hgb urine dipstick: NEGATIVE
Ketones, ur: 15 mg/dL — AB
Leukocytes, UA: NEGATIVE
Nitrite: NEGATIVE
Protein, ur: NEGATIVE mg/dL
Specific Gravity, Urine: 1.035 — ABNORMAL HIGH (ref 1.005–1.030)
Urobilinogen, UA: 0.2 mg/dL (ref 0.0–1.0)
pH: 5 (ref 5.0–8.0)

## 2013-08-07 LAB — LIPASE, BLOOD: Lipase: 23 U/L (ref 11–59)

## 2013-08-07 LAB — I-STAT VENOUS BLOOD GAS, ED
Bicarbonate: 25.5 mEq/L — ABNORMAL HIGH (ref 20.0–24.0)
O2 Saturation: 58 %
TCO2: 27 mmol/L (ref 0–100)
pCO2, Ven: 45.1 mmHg (ref 45.0–50.0)
pH, Ven: 7.361 — ABNORMAL HIGH (ref 7.250–7.300)
pO2, Ven: 32 mmHg (ref 30.0–45.0)

## 2013-08-07 LAB — TROPONIN I
Troponin I: <0.3 ng/mL (ref <0.30)
Troponin I: <0.3 ng/mL (ref <0.30)

## 2013-08-07 LAB — CBG MONITORING, ED: Glucose-Capillary: 175 mg/dL — ABNORMAL HIGH (ref 70–99)

## 2013-08-07 LAB — PRO B NATRIURETIC PEPTIDE: Pro B Natriuretic peptide (BNP): 43.4 pg/mL (ref 0–125)

## 2013-08-07 MED ORDER — HEPARIN (PORCINE) IN NACL 100-0.45 UNIT/ML-% IJ SOLN
1700.0000 [IU]/h | INTRAMUSCULAR | Status: DC
  Administered 2013-08-07: 1400 [IU]/h via INTRAVENOUS
  Administered 2013-08-08: 1700 [IU]/h via INTRAVENOUS
  Filled 2013-08-07 (×2): qty 250

## 2013-08-07 MED ORDER — SODIUM CHLORIDE 0.9 % IV BOLUS (SEPSIS)
1000.0000 mL | Freq: Once | INTRAVENOUS | Status: AC
  Administered 2013-08-07: 1000 mL via INTRAVENOUS

## 2013-08-07 MED ORDER — DEXTROSE-NACL 5-0.45 % IV SOLN
INTRAVENOUS | Status: DC
  Administered 2013-08-08: 11:00:00 via INTRAVENOUS
  Administered 2013-08-08: 75 mL/h via INTRAVENOUS

## 2013-08-07 MED ORDER — INSULIN REGULAR BOLUS VIA INFUSION
0.0000 [IU] | Freq: Three times a day (TID) | INTRAVENOUS | Status: DC
Start: 2013-08-08 — End: 2013-08-09
  Filled 2013-08-07: qty 10

## 2013-08-07 MED ORDER — DEXTROSE 50 % IV SOLN: 25.0000 mL | INTRAVENOUS | Status: DC | PRN

## 2013-08-07 MED ORDER — SODIUM CHLORIDE 0.9 % IJ SOLN
3.0000 mL | Freq: Two times a day (BID) | INTRAMUSCULAR | Status: DC
  Administered 2013-08-08 (×2): 3 mL via INTRAVENOUS

## 2013-08-07 MED ORDER — HYDROCODONE-ACETAMINOPHEN 5-325 MG PO TABS
1.0000 | ORAL_TABLET | Freq: Once | ORAL | Status: AC
  Administered 2013-08-07: 1 via ORAL
  Filled 2013-08-07: qty 1

## 2013-08-07 MED ORDER — HEPARIN BOLUS VIA INFUSION
4000.0000 [IU] | Freq: Once | INTRAVENOUS | Status: AC
  Administered 2013-08-07: 4000 [IU] via INTRAVENOUS
  Filled 2013-08-07: qty 4000

## 2013-08-07 MED ORDER — METOPROLOL TARTRATE 25 MG PO TABS
25.0000 mg | ORAL_TABLET | Freq: Two times a day (BID) | ORAL | Status: DC
  Administered 2013-08-07 – 2013-08-09 (×3): 25 mg via ORAL
  Filled 2013-08-07 (×6): qty 1

## 2013-08-07 MED ORDER — SODIUM CHLORIDE 0.9 % IV SOLN
INTRAVENOUS | Status: DC
  Administered 2013-08-08: 02:00:00 via INTRAVENOUS

## 2013-08-07 MED ORDER — GABAPENTIN 300 MG PO CAPS
300.0000 mg | ORAL_CAPSULE | Freq: Every day | ORAL | Status: DC
  Administered 2013-08-07 – 2013-08-09 (×3): 300 mg via ORAL
  Filled 2013-08-07 (×4): qty 1

## 2013-08-07 MED ORDER — ASPIRIN 81 MG PO CHEW
324.0000 mg | CHEWABLE_TABLET | Freq: Once | ORAL | Status: DC
  Filled 2013-08-07: qty 4

## 2013-08-07 MED ORDER — SODIUM CHLORIDE 0.9 % IV SOLN
INTRAVENOUS | Status: DC
  Administered 2013-08-08: 02:00:00 via INTRAVENOUS
  Filled 2013-08-07 (×2): qty 1

## 2013-08-07 MED ORDER — HYDROCODONE-ACETAMINOPHEN 5-325 MG PO TABS
1.0000 | ORAL_TABLET | ORAL | Status: DC | PRN
  Administered 2013-08-07 – 2013-08-08 (×2): 2 via ORAL
  Filled 2013-08-07 (×2): qty 2

## 2013-08-07 MED ORDER — SIMVASTATIN 10 MG PO TABS
10.0000 mg | ORAL_TABLET | Freq: Every day | ORAL | Status: DC
  Administered 2013-08-07 – 2013-08-08 (×2): 10 mg via ORAL
  Filled 2013-08-07 (×3): qty 1

## 2013-08-07 NOTE — ED Provider Notes (Signed)
CSN: [id number]     Arrival date & time 08/07/13  1649 History   First MD Initiated Contact with Patient 08/07/13 1706     Chief Complaint  Patient presents with  . Chest Pain     (Consider location/radiation/quality/duration/timing/severity/associated sxs/prior Treatment) HPI Comments: Patient presents from PCPs office for chest pain. He states he's been having chest pain intermittently over the past 3 days associated with shortness of breath and nausea. He has not felt well since the evening of March 20 when he had a near syncopal episode. He reports having dinner and one hour later feeling very hot, lightheaded, nauseated. He walked into the house and the wife says he had a syncopal episode while sitting in the recliner and stopped breathing for a few seconds. Since then he's felt unwell dizziness lightheadedness. Intermittent chest pain for the past several days it is a tightness. Is a history of CAD with stents but has not had chest pain like this in the past. She has diabetes, hypertension, hyperlipidemia. Good by mouth intake and urine output. Sugar is well-controlled.  The history is provided by the patient.    Past Medical History  Diagnosis Date  . Diabetes mellitus   . Hypertension   . GERD (gastroesophageal reflux disease)   . Arthritis   . CAD (coronary artery disease)     2010  LAD 50%, RCA 100%.  DES to RCA.  EF 55%  . AODM   . Disorders of iron metabolism   . MURMUR   . CHEST PAIN-UNSPECIFIED   . DISORDERS OF IRON METABOLISM   . Medically noncompliant   . Cellulitis and abscess rt groin   . Hyperlipidemia    Past Surgical History  Procedure Laterality Date  . Coronary stent placement    . Hernia repair  2000  . Cystectomy  1980    left hand and lip   Family History  Problem Relation Age of Onset  . Diabetes Father   . Valvular heart disease Father   . Alzheimer's disease Mother    History  Substance Use Topics  . Smoking status: Current Some Day Smoker  -- 0.10 packs/day for 51 years    Types: Cigarettes  . Smokeless tobacco: Never Used  . Alcohol Use: No    Review of Systems  Constitutional: Positive for diaphoresis, activity change and fatigue. Negative for fever.  HENT: Negative for congestion and rhinorrhea.   Respiratory: Positive for chest tightness and shortness of breath. Negative for cough.   Cardiovascular: Positive for chest pain.  Gastrointestinal: Positive for nausea. Negative for vomiting and abdominal pain.  Genitourinary: Negative for dysuria and hematuria.  Musculoskeletal: Negative for arthralgias, back pain and myalgias.  Skin: Negative for rash.  Neurological: Positive for weakness and light-headedness. Negative for dizziness.  A complete 10 system review of systems was obtained and all systems are negative except as noted in the HPI and PMH.      Allergies  Metformin and related; Horse-derived products; Crestor; and Lipitor  Home Medications   No current outpatient prescriptions on file. BP 110/76  Pulse 57  Temp(Src) 98.6 F (37 C) (Oral)  Resp 14  Ht 6' 2.02" (1.88 m)  Wt 253 lb 12 oz (115.1 kg)  BMI 32.57 kg/m2  SpO2 95% Physical Exam  Constitutional: He is oriented to person, place, and time. He appears well-developed and well-nourished. No distress.  HENT:  Head: Normocephalic and atraumatic.  Mouth/Throat: Oropharynx is clear and moist. No oropharyngeal exudate.  Eyes:  Conjunctivae and EOM are normal. Pupils are equal, round, and reactive to light.  Neck: Normal range of motion. Neck supple.  Cardiovascular: Normal rate, regular rhythm and normal heart sounds.   No murmur heard. Pulmonary/Chest: Effort normal and breath sounds normal. No respiratory distress. He exhibits tenderness.  Lower sternal and epigastric tenderness  Abdominal: Soft. There is tenderness. There is no rebound and no guarding.  Musculoskeletal: Normal range of motion. He exhibits no edema and no tenderness.   Neurological: He is alert and oriented to person, place, and time. No cranial nerve deficit. He exhibits normal muscle tone. Coordination normal.  CN 2-12 intact, no ataxia on finger to nose, no nystagmus, 5/5 strength throughout, no pronator drift, Romberg negative, normal gait.   Skin: Skin is warm.    ED Course  Procedures (including critical care time) Labs Review Labs Reviewed  CBC WITH DIFFERENTIAL - Abnormal; Notable for the following:    WBC 12.1 (*)    RBC 5.83 (*)    Hemoglobin 17.7 (*)    MCHC 36.5 (*)    Neutro Abs 8.1 (*)    Basophils Absolute 0.2 (*)    All other components within normal limits  COMPREHENSIVE METABOLIC PANEL - Abnormal; Notable for the following:    Glucose, Bld 256 (*)    BUN 26 (*)    GFR calc non Af Amer 89 (*)    All other components within normal limits  URINALYSIS, ROUTINE W REFLEX MICROSCOPIC - Abnormal; Notable for the following:    Specific Gravity, Urine 1.035 (*)    Glucose, UA >1000 (*)    Ketones, ur 15 (*)    All other components within normal limits  HEMOGLOBIN A1C - Abnormal; Notable for the following:    Hemoglobin A1C 10.9 (*)    Mean Plasma Glucose 266 (*)    All other components within normal limits  LIPID PANEL - Abnormal; Notable for the following:    Cholesterol 215 (*)    Triglycerides 475 (*)    HDL 22 (*)    All other components within normal limits  BASIC METABOLIC PANEL - Abnormal; Notable for the following:    Glucose, Bld 190 (*)    GFR calc non Af Amer 89 (*)    All other components within normal limits  GLUCOSE, CAPILLARY - Abnormal; Notable for the following:    Glucose-Capillary 184 (*)    All other components within normal limits  I-STAT VENOUS BLOOD GAS, ED - Abnormal; Notable for the following:    pH, Ven 7.361 (*)    Bicarbonate 25.5 (*)    All other components within normal limits  CBG MONITORING, ED - Abnormal; Notable for the following:    Glucose-Capillary 175 (*)    All other components  within normal limits  MRSA PCR SCREENING  TROPONIN I  LIPASE, BLOOD  KETONES, QUALITATIVE  URINE MICROSCOPIC-ADD ON  TROPONIN I  TSH  PRO B NATRIURETIC PEPTIDE  CBC WITH DIFFERENTIAL  COMPREHENSIVE METABOLIC PANEL  TROPONIN I  TROPONIN I  HEPARIN LEVEL (UNFRACTIONATED)  HEPARIN LEVEL (UNFRACTIONATED)  BASIC METABOLIC PANEL   Imaging Review Dg Chest 2 View  08/07/2013   CLINICAL DATA:  Weakness.  Syncope.  Chest pain  EXAM: CHEST  2 VIEW  COMPARISON:  NM MYOCAR MULTI w/SPECT w/WALL MOTION / EF dated 10/27/2011; DG CHEST 1V PORT dated 10/25/2011  FINDINGS: The lungs appear clear.  Cardiac and mediastinal contours normal.  No pleural effusion identified.  Thoracic spondylosis noted.  IMPRESSION: No active cardiopulmonary disease.   Electronically Signed   By: Sherryl Barters M.D.   On: 08/07/2013 18:45   Ct Head Wo Contrast  08/07/2013   CLINICAL DATA:  Headaches and dizziness.  EXAM: CT HEAD WITHOUT CONTRAST  TECHNIQUE: Contiguous axial images were obtained from the base of the skull through the vertex without intravenous contrast.  COMPARISON:  None.  FINDINGS: There is no evidence of acute cortical infarct, intracranial hemorrhage, mass, midline shift, or extra-axial fluid collection. Patchy periventricular white matter hypodensities are nonspecific but compatible with mild chronic small vessel ischemic disease. Ventricles and sulci are within normal limits for age. Orbits are unremarkable. Mastoid air cells and visualized paranasal sinuses are clear.  IMPRESSION: No evidence of acute intracranial abnormality. Mild chronic small vessel ischemic disease.   Electronically Signed   By: Logan Bores   On: 08/07/2013 18:04     EKG Interpretation   Date/Time:  Monday August 07 2013 16:52:52 EDT Ventricular Rate:  66 PR Interval:  162 QRS Duration: 98 QT Interval:  379 QTC Calculation: 397 R Axis:   82 Text Interpretation:  Sinus rhythm Borderline right axis deviation  Nonspecific T  abnormalities, lateral leads No significant change was found  Reconfirmed by Jsoeph Podesta  MD, Tristen Luce [phone]) on 08/07/2013 6:44:21 PM      MDM   Final diagnoses:  Chest pain  Syncope   Syncopal episode 3 days ago with ongoing dizziness, lightheadedness, nausea diaphoresis. EKG without acute ST changes.  EKG unchanged. Troponin negative. Anion gap 17 with ketones in urine. CT head negative, CXR clear.  Nonfocal neuro exam and no chest pain in the ED> Symptoms concerning for ACS with nausea, diaphoresis, chest  Pain and syncope Appears dehydrated but not in DKA.  D/w Dr. Ernestina Patches who will admit.  Dr Ron Parker of cardiology consulting.     Date: 08/07/2013  Rate: 66  Rhythm: normal sinus rhythm  QRS Axis: normal  Intervals: normal  ST/T Wave abnormalities: nonspecific ST/T changes  Conduction Disutrbances:none  Narrative Interpretation:   Old EKG Reviewed: unchanged    Ezequiel Essex, MD 08/08/13 [phone]

## 2013-08-07 NOTE — ED Notes (Signed)
Pt to ED from doctors office in [place], [name]- pt went to doctors office for evaluation of chest pain onset 1 1/2 hours ago, admits to shortness of breath associated with symptoms- doctors office noted elevation in 2 leads and transferred pt for further evaluation.  Pt reports he had a syncopal episode on Friday, denies injury.  Pt has had 324 ASA, no nitro given.  Pt denies CP upon arrival to ED but admits to "tightness that feels like indigestion".  IV in place, NSR on monitor.

## 2013-08-07 NOTE — H&P (Signed)
Hospitalist Admission History and Physical  Patient name: [name] [name] Medical record number: [id number] Date of birth: September 01, 1950 Age: 63 y.o. Gender: male  Primary Care Provider: [name], FNP  Chief Complaint: chest pain, syncope  History of Present Illness:This is a 63 y.o. year old male with multiple medical problems includin CAD s/p stents (approx 9 years ago per pt), poorly controlled IDDM, HLD, HTN presenting with syncopal episode with chest pain. Pt states that he was out to dinner on Friday night. After dinner, pt had progressive onset of central chest pressure, nausea diaphoresis. Sxs persisted until he got home where pt had a witnessed syncopal episode. Per the family, pt was unconscious for about 10-30 seconds with pt being aroused by family. No seizure activity per family. No confusion. Denies any hemiparesis or confusion prior to incident. Family did not take pt to ER at that time. Pt states that he has had persistent fatigue over the course of the weekend with intermittent episodes of diaphoresis, and central chest pressure. Also with intermittent headache. Chest pressure and fatigue seems to be worse with exertion.  Was seen by PCP earlier today and was told to go emergently to ER for further evaluation.   In the ER, trop neg x 1. EKG WNL. WBC mildly elevated @ 12.1. Initial head CT WNL. Noted glucose @ 256 with AG 17. No acidosis on blood gas. Was given vicodin and full dose ASA with some improvement in chest pressure. Cardiology consulted in ER.  Initial HEART score 5.    Patient Active Problem List   Diagnosis Date Noted  . Chest pain 08/07/2013  . Diabetes mellitus 10/25/2011  . Medically noncompliant 10/25/2011  . Cellulitis and abscess rt groin 10/25/2011  . CAD S/P percutaneous coronary angioplasty 10/25/2011  . MURMUR 04/16/2009  . AODM 08/02/2008  . Essential hypertension, benign 08/02/2008  . HYPERLIPIDEMIA-MIXED 07/31/2008  . DISORDERS OF IRON METABOLISM  07/31/2008  . CHEST PAIN-UNSPECIFIED 07/31/2008  . DISORDERS OF IRON METABOLISM 07/31/2008   Past Medical History: Past Medical History  Diagnosis Date  . Diabetes mellitus   . Hypertension   . GERD (gastroesophageal reflux disease)   . Arthritis   . CAD (coronary artery disease)     2010  LAD 50%, RCA 100%.  DES to RCA.  EF 55%  . AODM   . Disorders of iron metabolism   . MURMUR   . CHEST PAIN-UNSPECIFIED   . DISORDERS OF IRON METABOLISM   . Medically noncompliant   . Cellulitis and abscess rt groin   . Hyperlipidemia     Past Surgical History: Past Surgical History  Procedure Laterality Date  . Coronary stent placement    . Hernia repair  2000  . Cystectomy  1980    left hand and lip    Social History: History   Social History  . Marital Status: Married    Spouse Name: N/A    Number of Children: 2  . Years of Education: N/A   Social History Main Topics  . Smoking status: Current Some Day Smoker -- 0.10 packs/day for 51 years    Types: Cigarettes  . Smokeless tobacco: Never Used  . Alcohol Use: No  . Drug Use: No  . Sexual Activity: None   Other Topics Concern  . None   Social History Narrative   Lives alone.  Disabled.  Unable to afford medicines.      Family History: Family History  Problem Relation Age of Onset  .  Diabetes Father   . Valvular heart disease Father   . Alzheimer's disease Mother     Allergies: Allergies  Allergen Reactions  . Metformin And Related Itching  . Horse-Derived Products     REACTION: Rash  . Crestor [Rosuvastatin] Other (See Comments)    Myalgias   . Lipitor [Atorvastatin] Other (See Comments)    myalgias    Current Facility-Administered Medications  Medication Dose Route Frequency Provider Last Rate Last Dose  . aspirin chewable tablet 324 mg  324 mg Oral Once Ezequiel Essex, MD      . gabapentin (NEURONTIN) capsule 300 mg  300 mg Oral Daily Shanda Howells, MD      . HYDROcodone-acetaminophen  (NORCO/VICODIN) 5-325 MG per tablet 1-2 tablet  1-2 tablet Oral Q4H PRN Shanda Howells, MD      . metoprolol tartrate (LOPRESSOR) tablet 25 mg  25 mg Oral BID Shanda Howells, MD      . Derrill Memo ON 08/08/2013] simvastatin (ZOCOR) tablet 10 mg  10 mg Oral q1800 Shanda Howells, MD      . sodium chloride 0.9 % injection 3 mL  3 mL Intravenous Q12H Shanda Howells, MD       Current Outpatient Prescriptions  Medication Sig Dispense Refill  . aspirin EC 81 MG tablet Take 81 mg by mouth daily.      Marland Kitchen gabapentin (NEURONTIN) 300 MG capsule Take 300 mg by mouth daily.      . Insulin Detemir (LEVEMIR) 100 UNIT/ML Pen Inject 40 Units into the skin 2 (two) times daily.       Marland Kitchen lisinopril (PRINIVIL,ZESTRIL) 10 MG tablet Take 10 mg by mouth daily.      . metoprolol tartrate (LOPRESSOR) 25 MG tablet Take 1 tablet (25 mg total) by mouth 2 (two) times daily.  60 tablet  0  . Omega-3 Fatty Acids (FISH OIL) 1200 MG CAPS Take 2 capsules by mouth 2 (two) times daily.       . pravastatin (PRAVACHOL) 20 MG tablet Take 40 mg by mouth daily.       . sitaGLIPtin (JANUVIA) 100 MG tablet Take 1 tablet (100 mg total) by mouth daily.  28 tablet  0  . omeprazole (PRILOSEC) 20 MG capsule Take 1 capsule (20 mg total) by mouth daily.  30 capsule  0   Review Of Systems: 12 point ROS negative except as noted above in HPI.  Physical Exam: Filed Vitals:   08/07/13 2030  BP: 156/83  Pulse: 70  Temp:   Resp: 10    General: moderately obese HEENT: PERRLA and extra ocular movement intact Heart: S1, S2 normal, no murmur, rub or gallop, regular rate and rhythm Lungs: clear to auscultation, no wheezes or rales and unlabored breathing Abdomen: abdomen is soft without significant tenderness, masses, organomegaly or guarding Extremities: extremities normal, atraumatic, no cyanosis or edema Skin:no rashes, no ecchymoses Neurology: normal without focal findings  Labs and Imaging: Lab Results  Component Value Date/Time   NA 138  08/07/2013  5:32 PM   NA 138 05/31/2013 10:39 AM   K 4.8 08/07/2013  5:32 PM   CL 99 08/07/2013  5:32 PM   CO2 22 08/07/2013  5:32 PM   BUN 26* 08/07/2013  5:32 PM   BUN 20 05/31/2013 10:39 AM   CREATININE 0.91 08/07/2013  5:32 PM   GLUCOSE 256* 08/07/2013  5:32 PM   GLUCOSE 316* 05/31/2013 10:39 AM   Lab Results  Component Value Date   WBC 12.1* 08/07/2013  HGB 17.7* 08/07/2013   HCT 48.5 08/07/2013   MCV 83.2 08/07/2013   PLT 236 08/07/2013    Dg Chest 2 View  08/07/2013   CLINICAL DATA:  Weakness.  Syncope.  Chest pain  EXAM: CHEST  2 VIEW  COMPARISON:  NM MYOCAR MULTI w/SPECT w/WALL MOTION / EF dated 10/27/2011; DG CHEST 1V PORT dated 10/25/2011  FINDINGS: The lungs appear clear.  Cardiac and mediastinal contours normal.  No pleural effusion identified.  Thoracic spondylosis noted.  IMPRESSION: No active cardiopulmonary disease.   Electronically Signed   By: Sherryl Barters M.D.   On: 08/07/2013 18:45   Ct Head Wo Contrast  08/07/2013   CLINICAL DATA:  Headaches and dizziness.  EXAM: CT HEAD WITHOUT CONTRAST  TECHNIQUE: Contiguous axial images were obtained from the base of the skull through the vertex without intravenous contrast.  COMPARISON:  None.  FINDINGS: There is no evidence of acute cortical infarct, intracranial hemorrhage, mass, midline shift, or extra-axial fluid collection. Patchy periventricular white matter hypodensities are nonspecific but compatible with mild chronic small vessel ischemic disease. Ventricles and sulci are within normal limits for age. Orbits are unremarkable. Mastoid air cells and visualized paranasal sinuses are clear.  IMPRESSION: No evidence of acute intracranial abnormality. Mild chronic small vessel ischemic disease.   Electronically Signed   By: Logan Bores   On: 08/07/2013 18:04     Assessment and Plan: [name] [name] is a 63 y.o. year old male presenting with syncopal episode, chest pain   Syncopal episode/chest pain: Ddx includes diabetic ketosis,  dehydration. Coronary source much higher on DDx given presentation and multiple poorly contolled CV RFs with concern for unstable angina. Cardiology at bedside. Would like to start heparin gtt. Formal recs pending. Anticipate cath. Hold on NTG given headache. No bleeding noted on imaging. Will hydrate pt. Also start on glucomander. Cycle CEs. Risk stratification labs. No focal neuro deficits on exam, though may consider MRI brain to rule neuro source of sxs.   DM: Glucomander. A1C.   Leukocytosis: ? Reactive in setting of sxs.  No overt signs of infection currently. Will trend.    FEN/GI: NPO. High dose PPI.  Prophylaxis: heparin per pharmacy  Disposition: pending further evaluation  Code Status:Full Code.        Shanda Howells MD  Pager: [phone]

## 2013-08-07 NOTE — Progress Notes (Signed)
ANTICOAGULATION CONSULT NOTE - Initial Consult  Pharmacy Consult for heparin gtt Indication: chest pain/ACS  Allergies  Allergen Reactions  . Metformin And Related Itching  . Horse-Derived Products     REACTION: Rash  . Crestor [Rosuvastatin] Other (See Comments)    Myalgias   . Lipitor [Atorvastatin] Other (See Comments)    myalgias    Patient Measurements: Height: 6' 2.02" (188 cm) Weight: 253 lb 12 oz (115.1 kg) IBW/kg (Calculated) : 82.24 Heparin Dosing Weight: 106.5 kg  Vital Signs: Temp: 98.6 F (37 C) (03/23 1656) Temp src: Oral (03/23 1656) BP: 156/83 mmHg (03/23 2030) Pulse Rate: 70 (03/23 2030)  Labs:  Recent Labs  08/07/13 1732  HGB 17.7*  HCT 48.5  PLT 236  CREATININE 0.91  TROPONINI <0.30    Estimated Creatinine Clearance: 113.6 ml/min (by C-G formula based on Cr of 0.91).   Medical History: Past Medical History  Diagnosis Date  . Diabetes mellitus   . Hypertension   . GERD (gastroesophageal reflux disease)   . Arthritis   . CAD (coronary artery disease)     2010  LAD 50%, RCA 100%.  DES to RCA.  EF 55%  . AODM   . Disorders of iron metabolism   . MURMUR   . CHEST PAIN-UNSPECIFIED   . DISORDERS OF IRON METABOLISM   . Medically noncompliant   . Cellulitis and abscess rt groin   . Hyperlipidemia     Medications:  Scheduled:  . aspirin  324 mg Oral Once  . gabapentin  300 mg Oral Daily  . metoprolol tartrate  25 mg Oral BID  . [START ON 08/08/2013] simvastatin  10 mg Oral q1800  . sodium chloride  3 mL Intravenous Q12H   Assessment: 63 yo M sent to ED from outpatient physician office after 3 days prior experienced syncopal episode, nausea, diaphoresis, and dizziness.  He has felt weak since that episode.  Patient now with chest discomfort, nausea, and vomiting.  Initial labs reveal H/H at 17.7/48.5 and Plt's wnl.  SCr is 0.91 with estimated CrCl ~114.  I spoke with patient, and he reports the only "blood thinner" he currently takes is  ASA.  He reports no recent bleeding issues and no history of a "head bleed".    Goal of Therapy:  Heparin level 0.3-0.7 units/ml Monitor platelets by anticoagulation protocol: Yes   Plan:  - give heparin IV bolus 4000 units, followed by 1400 units/hr - draw 6h HL - daily HL and CBC - monitor for s/s of bleeding  Ovid Curd E. Jacqlyn Larsen, PharmD Clinical Pharmacist - Resident Pager: [phone] Pharmacy: [phone] 08/07/2013 9:19 PM

## 2013-08-07 NOTE — Consult Note (Signed)
CARDIOLOGY CONSULT NOTE   Patient ID: [name] [name] MRN: [id number] DOB/AGE: 1951/04/27 63 y.o.  Admit date: 08/07/2013  Primary Physician   [name], FNP Primary Cardiologist   Dr. [name]  Reason for Consultation   Chest pain.   HPI: [name] [name] is a 63 y.o. male with a history of CAD s/p DES (? 57yrs ago), diabetes, HTN, HLD, GERD, and obesity who was sent to the ED from his family medicine doctor today for chest pain. He made an appointment with his PCP after an episode of syncope on Friday night with a residual headache and chest pain.  The patient was in his usual state of health until Friday night after dinner with friends when he stepped out of the car and reportedly felt dizzy, lightheaded, nauseated, and diaphoretic. He felt like he was going to pass out so he sat down in his recliner. Per his wife's report; he did loose consciousness, but was "in and out." There was no seizure like activity or loss of bowel or bladder control. He then woke up with a headache and chest pain. The chest pain is described as a 2/10, constant pressure in his epigastrium that radiates to the left breast. He reports associated waves of nausea associated with SOB and diaphoreses. He does admit to chest pain with palpation.   The patient has recently had many of his diabetic medicines changed.   2D ECHO 10/26/2011 with Normal LV size with low normal to mildly reduced systolic function, EF 99991111. No regional wall motion abnormalities. Normal RV size and systolic function. No significant valvular abnormalities.  Nuclear Stress test 10/2011 1. Normal EF with basal inferior hypokinesis. 2. Partially reversible medium-sized, moderate basal to mid inferior perfusion defect. This suggests prior inferior infarction with some peri-infarct ischemia. Patient has a history of prior inferior MI. Suspect that this is a low risk study.   Past Medical History  Diagnosis Date  . Diabetes mellitus    . Hypertension   . GERD (gastroesophageal reflux disease)   . Arthritis   . CAD (coronary artery disease)     2010  LAD 50%, RCA 100%.  DES to RCA.  EF 55%  . AODM   . Disorders of iron metabolism   . MURMUR   . CHEST PAIN-UNSPECIFIED   . DISORDERS OF IRON METABOLISM   . Medically noncompliant   . Cellulitis and abscess rt groin   . Hyperlipidemia      Past Surgical History  Procedure Laterality Date  . Coronary stent placement    . Hernia repair  2000  . Cystectomy  1980    left hand and lip    Allergies  Allergen Reactions  . Metformin And Related Itching  . Horse-Derived Products     REACTION: Rash  . Crestor [Rosuvastatin] Other (See Comments)    Myalgias   . Lipitor [Atorvastatin] Other (See Comments)    myalgias    I have reviewed the patient's current medications . aspirin  324 mg Oral Once  . sodium chloride  3 mL Intravenous Q12H     HYDROcodone-acetaminophen  Prior to Admission medications   Medication Sig Start Date End Date Taking? Authorizing Provider  aspirin EC 81 MG tablet Take 81 mg by mouth daily.   Yes Historical Provider, MD  gabapentin (NEURONTIN) 300 MG capsule Take 300 mg by mouth daily.   Yes Historical Provider, MD  Insulin Detemir (LEVEMIR) 100 UNIT/ML Pen Inject 40 Units  into the skin 2 (two) times daily.    Yes Historical Provider, MD  lisinopril (PRINIVIL,ZESTRIL) 10 MG tablet Take 10 mg by mouth daily.   Yes Historical Provider, MD  metoprolol tartrate (LOPRESSOR) 25 MG tablet Take 1 tablet (25 mg total) by mouth 2 (two) times daily. 10/29/11 08/07/13 Yes Srikar Janna Arch, MD  Omega-3 Fatty Acids (FISH OIL) 1200 MG CAPS Take 2 capsules by mouth 2 (two) times daily.    Yes Historical Provider, MD  pravastatin (PRAVACHOL) 20 MG tablet Take 40 mg by mouth daily.    Yes Historical Provider, MD  sitaGLIPtin (JANUVIA) 100 MG tablet Take 1 tablet (100 mg total) by mouth daily. 07/12/13  Yes Tammy Eckard, PHARMD  omeprazole (PRILOSEC) 20 MG  capsule Take 1 capsule (20 mg total) by mouth daily. 10/29/11 05/31/13  Bynum Bellows, MD     History   Social History  . Marital Status: Married    Spouse Name: N/A    Number of Children: 2  . Years of Education: N/A   Occupational History  . Not on file.   Social History Main Topics  . Smoking status: Current Some Day Smoker -- 0.10 packs/day for 51 years    Types: Cigarettes  . Smokeless tobacco: Never Used  . Alcohol Use: No  . Drug Use: No  . Sexual Activity: Not on file   Other Topics Concern  . Not on file   Social History Narrative   Lives alone.  Disabled.  Unable to afford medicines.      No family status information on file.   Family History  Problem Relation Age of Onset  . Diabetes Father   . Valvular heart disease Father   . Alzheimer's disease Mother      ROS:  Full 14 point review of systems complete and found to be negative unless listed above.  Physical Exam: Blood pressure 180/88, pulse 72, temperature 98.6 F (37 C), temperature source Oral, resp. rate 14, SpO2 97.00%.  General: Well developed, well nourished, male in no acute distress Head: Eyes PERRLA, No xanthomas.   Normocephalic and atraumatic, oropharynx without edema or exudate. Dentition:  Lungs:  Heart: HRRR S1 S2, no rub/gallop, Heart irregular rate and rhythm with S1, S2  murmur. pulses are 2+ extrem.   Neck: No carotid bruits. No lymphadenopathy.  JVD. Abdomen: Bowel sounds present, abdomen soft and non-tender without masses or hernias noted. Msk:  No spine or cva tenderness. No weakness, no joint deformities or effusions. Extremities: No clubbing or cyanosis.  edema.  Neuro: Alert and oriented X 3. No focal deficits noted. Psych:  Good affect, responds appropriately Skin: No rashes or lesions noted.  Labs:   Lab Results  Component Value Date   WBC 12.1* 08/07/2013   HGB 17.7* 08/07/2013   HCT 48.5 08/07/2013   MCV 83.2 08/07/2013   PLT 236 08/07/2013      Recent Labs Lab  08/07/13 1732  NA 138  K 4.8  CL 99  CO2 22  BUN 26*  CREATININE 0.91  CALCIUM 10.3  PROT 7.9  BILITOT 0.3  ALKPHOS 65  ALT 19  AST 15  GLUCOSE 256*  ALBUMIN 4.1     Recent Labs  08/07/13 1732  TROPONINI <0.30   Lab Results  Component Value Date   CHOL 240* 10/28/2011   HDL 22* 05/31/2013   LDLCALC Comment 05/31/2013   TRIG 604* 05/31/2013    Lipase  Date/Time Value Ref Range Status  08/07/2013  5:32 PM 23  11 - 59 U/L Final   TSH  Date/Time Value Ref Range Status  05/31/2013 10:39 AM 1.870  0.450 - 4.500 uIU/mL Final     T4, Total  Date/Time Value Ref Range Status  05/31/2013 10:39 AM 5.2  4.5 - 12.0 ug/dL Final     Echo: Study Date: 10/26/2011 ------------------------------------------------------------ LV EF: 50% - 55% ------------------------------------------------------------ Indications: Chest pain 786.51. History: PMH: Murmur. Coronary artery disease. Risk factors: AODM. Iron metabolism disorder. Current tobacco use. Hypertension. Diabetes mellitus. Dyslipidemia. ------------------------------------------------------------ Study Conclusions - Left ventricle: The cavity size was normal. Wall thickness was normal. Systolic function was low normal to mildly reduced. The estimated ejection fraction was in the range of 50% to 55%. Wall motion was normal; there were no regional wall motion abnormalities. Doppler parameters are consistent with abnormal left ventricular relaxation (grade 1 diastolic dysfunction). - Aortic valve: There was no stenosis. - Mitral valve: No significant regurgitation. - Left atrium: The atrium was mildly dilated. - Right ventricle: The cavity size was normal. Systolic function was normal. - Pulmonary arteries: No complete TR doppler jet so unable to estimate PA systolic pressure. - Systemic veins: IVC measured 1.9 cm with normal respirophasic variation, suggesting RA pressure 6-10 mmHg. Impressions: - Normal LV size with  low normal to mildly reduced systolic function, EF 95-18%. No regional wall motion abnormalities. Normal RV size and systolic function. No significant valvular abnormalities.    ECG:  Sinus rhythm Borderline right axis deviation Nonspecific T abnormalities, lateral leads  Radiology:  Dg Chest 2 View  08/07/2013   CLINICAL DATA:  Weakness.  Syncope.  Chest pain  EXAM: CHEST  2 VIEW  COMPARISON:  NM MYOCAR MULTI w/SPECT w/WALL MOTION / EF dated 10/27/2011; DG CHEST 1V PORT dated 10/25/2011  FINDINGS: The lungs appear clear.  Cardiac and mediastinal contours normal.  No pleural effusion identified.  Thoracic spondylosis noted.  IMPRESSION: No active cardiopulmonary disease.    Ct Head Wo Contrast  08/07/2013   CLINICAL DATA:  Headaches and dizziness.  EXAM: CT HEAD WITHOUT CONTRAST  TECHNIQUE: Contiguous axial images were obtained from the base of the skull through the vertex without intravenous contrast.  COMPARISON:  None.  FINDINGS: There is no evidence of acute cortical infarct, intracranial hemorrhage, mass, midline shift, or extra-axial fluid collection. Patchy periventricular white matter hypodensities are nonspecific but compatible with mild chronic small vessel ischemic disease. Ventricles and sulci are within normal limits for age. Orbits are unremarkable. Mastoid air cells and visualized paranasal sinuses are clear.  IMPRESSION: No evidence of acute intracranial abnormality. Mild chronic small vessel ischemic disease.      ASSESSMENT AND PLAN:       HYPERLIPIDEMIA-MIXED    Essential hypertension, benign    Diabetes mellitus     The patient recently had several changes made to his diabetic meds. I do not know if any of his symptoms including his nausea and vomiting could be related to this.    CAD S/P percutaneous coronary angioplasty     The patient has documented coronary disease. He has multiple ongoing significant risk factors. His current chest discomfort is concerning. There are  no diagnostic EKG changes. Despite significant symptoms for several days, the troponins are normal so far. We need to rule out ischemia as the basis of his symptoms. However I am not yet sure that this is the basis of his problems.    Chest pain     The patient has persistent chest discomfort in  the area of the manubrium and under the left breast. There is some pain to palpation. Etiology remains unclear. Cardiac etiology must be ruled out.    Headache     The patient and his wife say that the type of headache that he is having is different from anything that he has had before. He has had a screening CT of the head showing no diagnostic abnormality. I do agree that he may need more complete evaluation of his headache.    Diaphoresis      On the recent Friday night when he had the beginning of his symptoms,  he had marked diaphoresis. This was associated with nausea and a headache. We think the chest discomfort started as a similar time. He was helped into his home and into a recliner. He then appeared to have decreased consciousness. Cardiac ischemia needs to be ruled out as the basis of this symptom. However we have no definite proof yet.  Overall the picture is confusing. Many of his symptoms are concerning for possible cardiac ischemia. However, other potential etiologies will have to be considered also. I do agree with treating the patient with heparin. I would not start IV nitroglycerin at this point because of his headache. He needs to be monitored to be sure that significant bradycardia arrhythmias are not causing his problems.    SignedPerry Mount, PA-C 08/07/2013 8:52 PM  Patient seen and examined. I agree with the assessment and plan as detailed above. I personally written the complete assessment and plan.   Dola Argyle, MD, [facility] 08/07/2013 10:11 PM

## 2013-08-07 NOTE — ED Notes (Signed)
Patient stated he felt "dizzy" upon standing.

## 2013-08-07 NOTE — Progress Notes (Signed)
   Subjective:    Patient ID: [name] [name], male    DOB: 07/05/1950, 63 y.o.   MRN: [id number]  HPI This 63 y.o. male presents for evaluation of weakness and syncopal episode 3 days ago. One hour after eating out he developed some nausea, diaphoresis, and dizziness.  He then Passed out and his wife states he quit breathing and then awoke after 30 seconds.  He  Felt weak ever since.  He has been having sweating and now has some chest discomfort. He has hx of PTCA. He has been nauseated and vomiting in the waiting room and has developed Mild to moderate substernal chest discomfort.   Review of Systems C/o diaphoresis, chest pain, nausea No HA, dizziness, vision change, diarrhea, constipation, dysuria, urinary urgency or frequency, myalgias, arthralgias or rash.     Objective:   Physical Exam   Vital signs noted  Chronically ill appearing male lying recumbant.  HEENT - Head atraumatic Normocephalic                Eyes - PERRLA, Conjuctiva - clear Sclera- Clear EOMI                Ears - EAC's Wnl TM's Wnl Gross Hearing WNL                Throat - oropharanx wnl Respiratory - Lungs CTA bilateral Cardiac - RRR S1 and S2 without murmur GI - Abdomen soft Nontender and bowel sounds active x 4  EKG - NSR with flipped t waves in lead I, AVL, v5, v6, elevated ST's in III and AVF.     Assessment & Plan:  Syncopal episodes - Plan: EKG 12-Lead  Chest pain - Discussed with patient that he needs to go to the ED via ambulance for r/o ACS and he  Agrees.  Oxygen placed on patient, IV started with NS at [facility] via peripheral IV left hand.  Report given to EMS and patient taken to [facility] ED.  [name] FNP

## 2013-08-08 DIAGNOSIS — Z9861 Coronary angioplasty status: Secondary | ICD-10-CM

## 2013-08-08 DIAGNOSIS — I2510 Atherosclerotic heart disease of native coronary artery without angina pectoris: Secondary | ICD-10-CM

## 2013-08-08 DIAGNOSIS — L0390 Cellulitis, unspecified: Secondary | ICD-10-CM

## 2013-08-08 DIAGNOSIS — L0291 Cutaneous abscess, unspecified: Secondary | ICD-10-CM

## 2013-08-08 LAB — CBC WITH DIFFERENTIAL/PLATELET
Basophils Absolute: 0.1 10*3/uL (ref 0.0–0.1)
Basophils Relative: 0 % (ref 0–1)
Eosinophils Absolute: 0.2 10*3/uL (ref 0.0–0.7)
Eosinophils Relative: 2 % (ref 0–5)
HCT: 43.7 % (ref 39.0–52.0)
Hemoglobin: 15.8 g/dL (ref 13.0–17.0)
Lymphocytes Relative: 26 % (ref 12–46)
Lymphs Abs: 2.9 10*3/uL (ref 0.7–4.0)
MCH: 30.3 pg (ref 26.0–34.0)
MCHC: 36.2 g/dL — ABNORMAL HIGH (ref 30.0–36.0)
MCV: 83.9 fL (ref 78.0–100.0)
Monocytes Absolute: 0.9 10*3/uL (ref 0.1–1.0)
Monocytes Relative: 8 % (ref 3–12)
Neutro Abs: 7.2 10*3/uL (ref 1.7–7.7)
Neutrophils Relative %: 64 % (ref 43–77)
Platelets: 213 10*3/uL (ref 150–400)
RBC: 5.21 MIL/uL (ref 4.22–5.81)
RDW: 12.9 % (ref 11.5–15.5)
WBC: 11.3 10*3/uL — ABNORMAL HIGH (ref 4.0–10.5)

## 2013-08-08 LAB — GLUCOSE, CAPILLARY
Glucose-Capillary: 265 mg/dL — ABNORMAL HIGH (ref 70–99)
Glucose-Capillary: 281 mg/dL — ABNORMAL HIGH (ref 70–99)
Glucose-Capillary: 248 mg/dL — ABNORMAL HIGH (ref 70–99)
Glucose-Capillary: 237 mg/dL — ABNORMAL HIGH (ref 70–99)
Glucose-Capillary: 236 mg/dL — ABNORMAL HIGH (ref 70–99)
Glucose-Capillary: 212 mg/dL — ABNORMAL HIGH (ref 70–99)
Glucose-Capillary: 169 mg/dL — ABNORMAL HIGH (ref 70–99)
Glucose-Capillary: 154 mg/dL — ABNORMAL HIGH (ref 70–99)
Glucose-Capillary: 149 mg/dL — ABNORMAL HIGH (ref 70–99)
Glucose-Capillary: 148 mg/dL — ABNORMAL HIGH (ref 70–99)
Glucose-Capillary: 135 mg/dL — ABNORMAL HIGH (ref 70–99)
Glucose-Capillary: 233 mg/dL — ABNORMAL HIGH (ref 70–99)
Glucose-Capillary: 228 mg/dL — ABNORMAL HIGH (ref 70–99)

## 2013-08-08 LAB — COMPREHENSIVE METABOLIC PANEL
ALT: 16 U/L (ref 0–53)
AST: 15 U/L (ref 0–37)
Albumin: 3.5 g/dL (ref 3.5–5.2)
Alkaline Phosphatase: 54 U/L (ref 39–117)
BUN: 25 mg/dL — ABNORMAL HIGH (ref 6–23)
CO2: 22 mEq/L (ref 19–32)
Calcium: 9.4 mg/dL (ref 8.4–10.5)
Chloride: 100 mEq/L (ref 96–112)
Creatinine, Ser: 0.95 mg/dL (ref 0.50–1.35)
GFR calc Af Amer: >90 mL/min (ref >90)
GFR calc non Af Amer: 87 mL/min — ABNORMAL LOW (ref >90)
Glucose, Bld: 270 mg/dL — ABNORMAL HIGH (ref 70–99)
Potassium: 4 mEq/L (ref 3.7–5.3)
Sodium: 137 mEq/L (ref 137–147)
Total Bilirubin: 0.4 mg/dL (ref 0.3–1.2)
Total Protein: 6.5 g/dL (ref 6.0–8.3)

## 2013-08-08 LAB — HEPARIN LEVEL (UNFRACTIONATED)
Heparin Unfractionated: 0.18 IU/mL — ABNORMAL LOW (ref 0.30–0.70)
Heparin Unfractionated: 0.29 IU/mL — ABNORMAL LOW (ref 0.30–0.70)

## 2013-08-08 LAB — HEMOGLOBIN A1C
Hgb A1c MFr Bld: 10.9 % — ABNORMAL HIGH (ref <5.7)
Mean Plasma Glucose: 266 mg/dL — ABNORMAL HIGH (ref <117)

## 2013-08-08 LAB — BASIC METABOLIC PANEL
BUN: 23 mg/dL (ref 6–23)
BUN: 26 mg/dL — ABNORMAL HIGH (ref 6–23)
CO2: 24 mEq/L (ref 19–32)
CO2: 25 mEq/L (ref 19–32)
Calcium: 9.6 mg/dL (ref 8.4–10.5)
Calcium: 9.8 mg/dL (ref 8.4–10.5)
Chloride: 101 mEq/L (ref 96–112)
Chloride: 99 mEq/L (ref 96–112)
Creatinine, Ser: 0.91 mg/dL (ref 0.50–1.35)
Creatinine, Ser: 1.1 mg/dL (ref 0.50–1.35)
GFR calc Af Amer: 81 mL/min — ABNORMAL LOW (ref >90)
GFR calc Af Amer: >90 mL/min (ref >90)
GFR calc non Af Amer: 70 mL/min — ABNORMAL LOW (ref >90)
GFR calc non Af Amer: 89 mL/min — ABNORMAL LOW (ref >90)
Glucose, Bld: 142 mg/dL — ABNORMAL HIGH (ref 70–99)
Glucose, Bld: 190 mg/dL — ABNORMAL HIGH (ref 70–99)
Potassium: 4.1 mEq/L (ref 3.7–5.3)
Potassium: 4.3 mEq/L (ref 3.7–5.3)
Sodium: 137 mEq/L (ref 137–147)
Sodium: 138 mEq/L (ref 137–147)

## 2013-08-08 LAB — TSH: TSH: 2.173 u[IU]/mL (ref 0.350–4.500)

## 2013-08-08 LAB — MRSA PCR SCREENING: MRSA by PCR: NEGATIVE

## 2013-08-08 LAB — TROPONIN I
Troponin I: <0.3 ng/mL (ref <0.30)
Troponin I: <0.3 ng/mL (ref <0.30)

## 2013-08-08 MED ORDER — HEPARIN (PORCINE) IN NACL 100-0.45 UNIT/ML-% IJ SOLN
1900.0000 [IU]/h | INTRAMUSCULAR | Status: DC
  Filled 2013-08-08: qty 250

## 2013-08-08 MED ORDER — KETOROLAC TROMETHAMINE 30 MG/ML IJ SOLN
30.0000 mg | Freq: Once | INTRAMUSCULAR | Status: AC
  Administered 2013-08-08: 30 mg via INTRAVENOUS
  Filled 2013-08-08: qty 1

## 2013-08-08 MED ORDER — INSULIN DETEMIR 100 UNIT/ML ~~LOC~~ SOLN
40.0000 [IU] | Freq: Every day | SUBCUTANEOUS | Status: DC
  Administered 2013-08-08: 40 [IU] via SUBCUTANEOUS
  Filled 2013-08-08 (×3): qty 0.4

## 2013-08-08 MED ORDER — METOCLOPRAMIDE HCL 5 MG/ML IJ SOLN
10.0000 mg | Freq: Once | INTRAMUSCULAR | Status: AC
  Administered 2013-08-08: 10 mg via INTRAVENOUS
  Filled 2013-08-08: qty 2

## 2013-08-08 MED ORDER — INSULIN ASPART 100 UNIT/ML ~~LOC~~ SOLN
0.0000 [IU] | Freq: Three times a day (TID) | SUBCUTANEOUS | Status: DC
  Administered 2013-08-08: 5 [IU] via SUBCUTANEOUS
  Administered 2013-08-09: 3 [IU] via SUBCUTANEOUS

## 2013-08-08 MED ORDER — INSULIN ASPART 100 UNIT/ML ~~LOC~~ SOLN
4.0000 [IU] | Freq: Three times a day (TID) | SUBCUTANEOUS | Status: DC
  Administered 2013-08-08 – 2013-08-09 (×2): 4 [IU] via SUBCUTANEOUS

## 2013-08-08 MED ORDER — INSULIN ASPART 100 UNIT/ML ~~LOC~~ SOLN
0.0000 [IU] | Freq: Every day | SUBCUTANEOUS | Status: DC
Start: 2013-08-08 — End: 2013-08-09
  Administered 2013-08-08: 2 [IU] via SUBCUTANEOUS

## 2013-08-08 MED ORDER — DIPHENHYDRAMINE HCL 50 MG/ML IJ SOLN
25.0000 mg | Freq: Once | INTRAMUSCULAR | Status: AC
  Administered 2013-08-08: 25 mg via INTRAVENOUS
  Filled 2013-08-08: qty 1

## 2013-08-08 MED ORDER — OXYCODONE-ACETAMINOPHEN 5-325 MG PO TABS
1.0000 | ORAL_TABLET | ORAL | Status: DC | PRN
  Administered 2013-08-08 – 2013-08-09 (×3): 1 via ORAL
  Filled 2013-08-08 (×3): qty 1

## 2013-08-08 NOTE — Progress Notes (Signed)
ANTICOAGULATION CONSULT NOTE - Follow Up Consult  Pharmacy Consult for Heparin Indication: chest pain/ACS  Allergies  Allergen Reactions  . Metformin And Related Itching  . Horse-Derived Products     REACTION: Rash  . Crestor [Rosuvastatin] Other (See Comments)    Myalgias   . Lipitor [Atorvastatin] Other (See Comments)    myalgias    Patient Measurements: Height: 6' 2.02" (188 cm) Weight: 251 lb 8.7 oz (114.1 kg) IBW/kg (Calculated) : 82.24 Heparin Dosing Weight: ~106kg  Vital Signs: Temp: 97.8 F (36.6 C) (03/24 0741) Temp src: Oral (03/24 0741) BP: 99/65 mmHg (03/24 0800) Pulse Rate: 57 (03/24 0800)  Labs:  Recent Labs  08/07/13 1732 08/07/13 2052 08/08/13 0010 08/08/13 0230 08/08/13 0852 08/08/13 0958  HGB 17.7*  --   --  15.8  --   --   HCT 48.5  --   --  43.7  --   --   PLT 236  --   --  213  --   --   HEPARINUNFRC  --   --   --  0.18*  --  0.29*  CREATININE 0.91  --  0.91 0.95  --   --   TROPONINI <0.30 <0.30  --  <0.30 <0.30  --     Estimated Creatinine Clearance: 108.3 ml/min (by C-G formula based on Cr of 0.95).   Medications:  Heparin @ 1700 units/hr  Assessment: 62yom continues on  heparin for chest pain. Troponins negative. Heparin level remains below goal despite rate increase this morning. CBC is stable. No bleeding reported.  Goal of Therapy:  Heparin level 0.3-0.7 units/ml Monitor platelets by anticoagulation protocol: Yes   Plan:  1) Increase heparin to 1900 units/hr 2) Check 6 hour heparin level  Deboraha Sprang 08/08/2013,11:23 AM

## 2013-08-08 NOTE — Progress Notes (Addendum)
Inpatient Diabetes Program Recommendations  AACE/ADA: New Consensus Statement on Inpatient Glycemic Control (2013)  Target Ranges:  Prepandial:   less than 140 mg/dL      Peak postprandial:   less than 180 mg/dL (1-2 hours)      Critically ill patients:  140 - 180 mg/dL  Results for [name] [name], [name] [name] (MRN [id number]) as of 08/08/2013 10:09  Ref. Range 08/08/2013 05:34 08/08/2013 06:30 08/08/2013 07:33 08/08/2013 08:32 08/08/2013 09:43  Glucose-Capillary Latest Range: 70-99 mg/dL 237 (H) 236 (H) 212 (H) 169 (H) 154 (H)   Consider transition off insulin gtt to home dose Levemir 40 units BID and moderate scale TID + HS per Glycemic Control Order-Set.  Please order first Levemir dose to be given 1-2 hours before discontinuing gtt. Will follow.  Thank you  Raoul Pitch BSN, RN,CDE Inpatient Diabetes Coordinator [phone] (team pager)

## 2013-08-08 NOTE — Progress Notes (Signed)
Pt reported having sharp pain btw shoulders and increased headache. Pt stated "It feels similar to when I had my first heart attack". Per protocol EKG was taken and Cardiology called. Cardiology felt that pain was not cardiac related and asked nurse to call [facility] to order medication for pain. [facility] NP instructed to give pt 2 tablets of Vicodin. During F/U pain assessment PT reported that pain in back was better but headache was still ongoing. [facility] NP order additional diphenhydramine 25mg , ketorolac 30mg  and metroclopramide 10mg  for headache. Vital signs remain stable and will continue to monitor pt.

## 2013-08-08 NOTE — Progress Notes (Signed)
ANTICOAGULATION CONSULT NOTE - Follow Up Consult  Pharmacy Consult for Heparin  Indication: chest pain/ACS  Allergies  Allergen Reactions  . Metformin And Related Itching  . Horse-Derived Products     REACTION: Rash  . Crestor [Rosuvastatin] Other (See Comments)    Myalgias   . Lipitor [Atorvastatin] Other (See Comments)    myalgias   Patient Measurements: Height: 6' 2.02" (188 cm) Weight: 253 lb 12 oz (115.1 kg) IBW/kg (Calculated) : 82.24 Vital Signs: Temp: 98.6 F (37 C) (03/23 1656) Temp src: Oral (03/23 1656) BP: 110/76 mmHg (03/24 0123) Pulse Rate: 57 (03/24 0123)  Labs:  Recent Labs  08/07/13 1732 08/07/13 2052 08/08/13 0010 08/08/13 0230  HGB 17.7*  --   --  15.8  HCT 48.5  --   --  43.7  PLT 236  --   --  213  HEPARINUNFRC  --   --   --  0.18*  CREATININE 0.91  --  0.91 0.95  TROPONINI <0.30 <0.30  --  <0.30    Estimated Creatinine Clearance: 108.8 ml/min (by C-G formula based on Cr of 0.95).  Medications:  Heparin 1400 units/hr  Assessment: 63 y/o M on heparin for CP. HL is 0.18. Other labs as above. No issues per RN.   Goal of Therapy:  Heparin level 0.3-0.7 units/ml Monitor platelets by anticoagulation protocol: Yes   Plan:  -Increase heparin drip to 1700 units/hr -1100 HL -Daily CBC/HL -Monitor for bleeding  Narda Bonds 08/08/2013,4:08 AM

## 2013-08-08 NOTE — Care Management Note (Signed)
    Page 1 of 1   08/08/2013     7:51:17 AM   CARE MANAGEMENT NOTE 08/08/2013  Patient:  [name] [name], [name] [name]   Account Number:  [id number]  Date Initiated:  08/08/2013  Documentation initiated by:  Elissa Hefty  Subjective/Objective Assessment:   adm w ch pain, syncope     Action/Plan:   lives w wife, pcp dr Gwyndolyn Saxon [place]   Anticipated DC Date:     Anticipated DC Plan:           Choice offered to / List presented to:             Status of service:   Medicare Important Message given?   (If response is "NO", the following Medicare IM given date fields will be blank) Date Medicare IM given:   Date Additional Medicare IM given:    Discharge Disposition:    Per UR Regulation:  Reviewed for med. necessity/level of care/duration of stay  If discussed at [place] of Stay Meetings, dates discussed:    Comments:

## 2013-08-08 NOTE — Progress Notes (Signed)
Patient ID: [name] [name], male   DOB: 15-Feb-1951, 63 y.o.   MRN: [id number]  [facility] PROGRESS NOTE  SULEMAN GUNNING AYT:[id number] DOB: [birth year] DOA: 08/07/2013 PCP: [name], FNP  Brief narrative: 63 y.o. year old male with multiple medical problems includin CAD s/p stents (approx 9 years ago per pt), poorly controlled IDDM, HLD, HTN presenting with syncopal episode and chest pain. Pt states that he was out to dinner on Friday night. After dinner, pt had progressive onset of central chest pressure, nausea, diaphoresis. Sxs persisted until he got home where pt had a witnessed syncopal episode. Per the family, pt was unconscious for about 10-30 seconds. No seizure activity per family. No confusion. Denies any hemiparesis or confusion prior to incident. Family did not take pt to ER at that time. Pt states that he has had persistent fatigue over the course of the weekend with intermittent episodes of diaphoresis, and central chest pressure.  In the ER, trop neg x 1. EKG WNL. WBC mildly elevated @ 12.1. Initial head CT WNL. Noted glucose @ 256 with AG 17. No acidosis on blood gas. Was given vicodin and full dose ASA with some improvement in chest pressure. Cardiology consulted in ER. Initial HEART score 5.   Active Problems:   Syncope - possibly vaso vagal, pt feels better this AM - management per cardiology  - nuclear perfusion study in AM   HYPERLIPIDEMIA-MIXED - continue statin    Essential hypertension, benign - reasonable inpatient control  - pt has history of non compliance and says he has had BP in 90's/60's at home and does note want to take BP medications   Diabetes mellitus - uncontrolled and with A1C > 10 - appreciate diabetic educator input    CAD S/P percutaneous coronary angioplasty - per cardio   Consultants:  Cardiology   Procedures/Studies: Dg Chest 2 View  08/07/2013   No active cardiopulmonary disease.    Ct Head Wo Contrast   08/07/2013   No evidence of  acute intracranial abnormality. Mild chronic small vessel ischemic disease.     Antibiotics:  None  Code Status: Full Family Communication: Pt at bedside Disposition Plan: Home when medically stable  HPI/Subjective: No events overnight.   Objective: Filed Vitals:   08/08/13 0800 08/08/13 1215 08/08/13 1639 08/08/13 1809  BP: 99/65 159/82 132/81 132/77  Pulse: 57   78  Temp:  98.1 F (36.7 C) 98.1 F (36.7 C) 97.7 F (36.5 C)  TempSrc:  Oral Oral Oral  Resp: 13 10 16 18   Height:    6\' 2"  (1.88 m)  Weight:      SpO2: 96% 97% 98% 96%    Intake/Output Summary (Last 24 hours) at 08/08/13 2005 Last data filed at 08/08/13 1335  Gross per 24 hour  Intake  794.9 ml  Output    900 ml  Net -105.1 ml    Exam:   General:  Pt is alert, follows commands appropriately, not in acute distress  Cardiovascular: Regular rate and rhythm, S1/S2, no murmurs, no rubs, no gallops  Respiratory: Clear to auscultation bilaterally, no wheezing, no crackles, no rhonchi  Abdomen: Soft, non tender, non distended, bowel sounds present, no guarding  Extremities: No edema, pulses DP and PT palpable bilaterally  Neuro: Grossly nonfocal  Data Reviewed: Basic Metabolic Panel:  Recent Labs Lab 08/07/13 1732 08/08/13 0010 08/08/13 0230 08/08/13 1200  NA 138 138 137 137  K 4.8 4.1 4.0 4.3  CL 99 99 100  101  CO2 22 24 22 25   GLUCOSE 256* 190* 270* 142*  BUN 26* 23 25* 26*  CREATININE 0.91 0.91 0.95 1.10  CALCIUM 10.3 9.8 9.4 9.6   Liver Function Tests:  Recent Labs Lab 08/07/13 1732 08/08/13 0230  AST 15 15  ALT 19 16  ALKPHOS 65 54  BILITOT 0.3 0.4  PROT 7.9 6.5  ALBUMIN 4.1 3.5    Recent Labs Lab 08/07/13 1732  LIPASE 23   CBC:  Recent Labs Lab 08/07/13 1732 08/08/13 0230  WBC 12.1* 11.3*  NEUTROABS 8.1* 7.2  HGB 17.7* 15.8  HCT 48.5 43.7  MCV 83.2 83.9  PLT 236 213   Cardiac Enzymes:  Recent Labs Lab 08/07/13 1732 08/07/13 2052 08/08/13 0230  08/08/13 0852  TROPONINI <0.30 <0.30 <0.30 <0.30   CBG:  Recent Labs Lab 08/08/13 0943 08/08/13 1039 08/08/13 1148 08/08/13 1254 08/08/13 1643  GLUCAP 154* 149* 148* 135* 233*    Recent Results (from the past 240 hour(s))  MRSA PCR SCREENING     Status: None   Collection Time    08/07/13 10:14 PM      Result Value Ref Range Status   MRSA by PCR NEGATIVE  NEGATIVE Final   Comment:            The GeneXpert MRSA Assay (FDA     approved for NASAL specimens     only), is one component of a     comprehensive MRSA colonization     surveillance program. It is not     intended to diagnose MRSA     infection nor to guide or     monitor treatment for     MRSA infections.     Scheduled Meds: . aspirin  324 mg Oral Once  . gabapentin  300 mg Oral Daily  . insulin aspart  0-15 Units Subcutaneous TID WC  . insulin aspart  0-5 Units Subcutaneous QHS  . insulin aspart  4 Units Subcutaneous TID WC  . insulin detemir  40 Units Subcutaneous QHS  . insulin regular  0-10 Units Intravenous TID WC  . metoprolol tartrate  25 mg Oral BID  . simvastatin  10 mg Oral q1800  . sodium chloride  3 mL Intravenous Q12H   Continuous Infusions: . insulin (NOVOLIN-R) infusion Stopped (08/08/13 1448)   Faye Ramsay, MD  [place] Pager [phone]  If 7PM-7AM, please contact night-coverage [email or web address] Password [email or web address] 08/08/2013, 8:05 PM   LOS: 1 day

## 2013-08-08 NOTE — Progress Notes (Signed)
Pt insulin drip started at 0230 3/24 pending second AG calculation per [facility] NP request.

## 2013-08-08 NOTE — Progress Notes (Signed)
Subjective:  Brief discomfort between shoulder blades at rest, similar to old angina. No further syncope. No arrhythmia overnight. BP normal to high.  Negative enzymes, no dynamic ECG changes.  Objective:  Temp:  [97.8 F (36.6 C)-98.6 F (37 C)] 98.1 F (36.7 C) (03/24 1215) Pulse Rate:  [52-81] 57 (03/24 0800) Resp:  [10-18] 10 (03/24 1215) BP: (92-181)/(58-94) 159/82 mmHg (03/24 1215) SpO2:  [95 %-100 %] 97 % (03/24 1215) Weight:  [114.1 kg (251 lb 8.7 oz)-115.1 kg (253 lb 12 oz)] 114.1 kg (251 lb 8.7 oz) (03/24 0432) Weight change:   Intake/Output from previous day: 03/23 0701 - 03/24 0700 In: 787.3 [I.V.:787.3] Out: -   Intake/Output from this shift: Total I/O In: 7.6 [I.V.:7.6] Out: 900 [Urine:900]  Medications: Current Facility-Administered Medications  Medication Dose Route Frequency Provider Last Rate Last Dose  . aspirin chewable tablet 324 mg  324 mg Oral Once Ezequiel Essex, MD      . dextrose 50 % solution 25 mL  25 mL Intravenous PRN Shanda Howells, MD      . gabapentin (NEURONTIN) capsule 300 mg  300 mg Oral Daily Shanda Howells, MD   300 mg at 08/08/13 0939  . heparin ADULT infusion 100 units/mL (25000 units/250 mL)  1,900 Units/hr Intravenous Continuous Deboraha Sprang, RPH 19 mL/hr at 08/08/13 1130 1,900 Units/hr at 08/08/13 1130  . insulin aspart (novoLOG) injection 0-15 Units  0-15 Units Subcutaneous TID WC Theodis Blaze, MD      . insulin aspart (novoLOG) injection 0-5 Units  0-5 Units Subcutaneous QHS Theodis Blaze, MD      . insulin aspart (novoLOG) injection 4 Units  4 Units Subcutaneous TID WC Theodis Blaze, MD      . insulin detemir (LEVEMIR) injection 40 Units  40 Units Subcutaneous QHS Theodis Blaze, MD      . insulin regular (NOVOLIN R,HUMULIN R) 1 Units/mL in sodium chloride 0.9 % 100 mL infusion   Intravenous Continuous Shanda Howells, MD      . insulin regular bolus via infusion 0-10 Units  0-10 Units Intravenous TID WC Shanda Howells,  MD      . metoprolol tartrate (LOPRESSOR) tablet 25 mg  25 mg Oral BID Shanda Howells, MD   25 mg at 08/07/13 2126  . oxyCODONE-acetaminophen (PERCOCET/ROXICET) 5-325 MG per tablet 1 tablet  1 tablet Oral Q3H PRN Theodis Blaze, MD      . simvastatin (ZOCOR) tablet 10 mg  10 mg Oral q1800 Shanda Howells, MD   10 mg at 08/07/13 2357  . sodium chloride 0.9 % injection 3 mL  3 mL Intravenous Q12H Shanda Howells, MD   3 mL at 08/08/13 0820    Physical Exam:  General: Alert, oriented x3, no distress Head: no evidence of trauma, PERRL, EOMI, no exophtalmos or lid lag, no myxedema, no xanthelasma; normal ears, nose and oropharynx Neck: normal jugular venous pulsations and no hepatojugular reflux; brisk carotid pulses without delay and no carotid bruits Chest: clear to auscultation, no signs of consolidation by percussion or palpation, normal fremitus, symmetrical and full respiratory excursions Cardiovascular: normal position and quality of the apical impulse, regular rhythm, normal first and second heart sounds, no murmurs, rubs or gallops Abdomen: no tenderness or distention, no masses by palpation, no abnormal pulsatility or arterial bruits, normal bowel sounds, no hepatosplenomegaly Extremities: no clubbing, cyanosis or edema; 2+ radial, ulnar and brachial pulses bilaterally; 2+ right femoral, posterior tibial and dorsalis pedis pulses;  2+ left femoral, posterior tibial and dorsalis pedis pulses; no subclavian or femoral bruits Neurological: grossly nonfocal   Lab Results: Results for orders placed during the hospital encounter of 08/07/13 (from the past 48 hour(s))  CBC WITH DIFFERENTIAL     Status: Abnormal   Collection Time    08/07/13  5:32 PM      Result Value Ref Range   WBC 12.1 (*) 4.0 - 10.5 K/uL   RBC 5.83 (*) 4.22 - 5.81 MIL/uL   Hemoglobin 17.7 (*) 13.0 - 17.0 g/dL   HCT 83.4  62.1 - 94.7 %   MCV 83.2  78.0 - 100.0 fL   MCH 30.4  26.0 - 34.0 pg   MCHC 36.5 (*) 30.0 - 36.0 g/dL    RDW 12.5  27.1 - 29.2 %   Platelets 236  150 - 400 K/uL   Neutrophils Relative % 67  43 - 77 %   Neutro Abs 8.1 (*) 1.7 - 7.7 K/uL   Lymphocytes Relative 23  12 - 46 %   Lymphs Abs 2.7  0.7 - 4.0 K/uL   Monocytes Relative 7  3 - 12 %   Monocytes Absolute 0.9  0.1 - 1.0 K/uL   Eosinophils Relative 2  0 - 5 %   Eosinophils Absolute 0.2  0.0 - 0.7 K/uL   Basophils Relative 1  0 - 1 %   Basophils Absolute 0.2 (*) 0.0 - 0.1 K/uL  COMPREHENSIVE METABOLIC PANEL     Status: Abnormal   Collection Time    08/07/13  5:32 PM      Result Value Ref Range   Sodium 138  137 - 147 mEq/L   Potassium 4.8  3.7 - 5.3 mEq/L   Chloride 99  96 - 112 mEq/L   CO2 22  19 - 32 mEq/L   Glucose, Bld 256 (*) 70 - 99 mg/dL   BUN 26 (*) 6 - 23 mg/dL   Creatinine, Ser 9.09  0.50 - 1.35 mg/dL   Calcium 03.0  8.4 - 14.9 mg/dL   Total Protein 7.9  6.0 - 8.3 g/dL   Albumin 4.1  3.5 - 5.2 g/dL   AST 15  0 - 37 U/L   Comment: HEMOLYSIS AT THIS LEVEL MAY AFFECT RESULT   ALT 19  0 - 53 U/L   Alkaline Phosphatase 65  39 - 117 U/L   Total Bilirubin 0.3  0.3 - 1.2 mg/dL   GFR calc non Af Amer 89 (*) >90 mL/min   GFR calc Af Amer >90  >90 mL/min   Comment: (NOTE)     The eGFR has been calculated using the CKD EPI equation.     This calculation has not been validated in all clinical situations.     eGFR's persistently <90 mL/min signify possible Chronic Kidney     Disease.  TROPONIN I     Status: None   Collection Time    08/07/13  5:32 PM      Result Value Ref Range   Troponin I <0.30  <0.30 ng/mL   Comment:            Due to the release kinetics of cTnI,     a negative result within the first hours     of the onset of symptoms does not rule out     myocardial infarction with certainty.     If myocardial infarction is still suspected,     repeat the test at appropriate intervals.  LIPASE, BLOOD     Status: None   Collection Time    08/07/13  5:32 PM      Result Value Ref Range   Lipase 23  11 - 59 U/L    URINALYSIS, ROUTINE W REFLEX MICROSCOPIC     Status: Abnormal   Collection Time    08/07/13  5:47 PM      Result Value Ref Range   Color, Urine YELLOW  YELLOW   APPearance CLEAR  CLEAR   Specific Gravity, Urine 1.035 (*) 1.005 - 1.030   pH 5.0  5.0 - 8.0   Glucose, UA >1000 (*) NEGATIVE mg/dL   Hgb urine dipstick NEGATIVE  NEGATIVE   Bilirubin Urine NEGATIVE  NEGATIVE   Ketones, ur 15 (*) NEGATIVE mg/dL   Protein, ur NEGATIVE  NEGATIVE mg/dL   Urobilinogen, UA 0.2  0.0 - 1.0 mg/dL   Nitrite NEGATIVE  NEGATIVE   Leukocytes, UA NEGATIVE  NEGATIVE  URINE MICROSCOPIC-ADD ON     Status: None   Collection Time    08/07/13  5:47 PM      Result Value Ref Range   Squamous Epithelial / LPF RARE  RARE   Bacteria, UA RARE  RARE  KETONES, QUALITATIVE     Status: None   Collection Time    08/07/13  6:40 PM      Result Value Ref Range   Acetone, Bld NEGATIVE  NEGATIVE  I-STAT VENOUS BLOOD GAS, ED     Status: Abnormal   Collection Time    08/07/13  6:51 PM      Result Value Ref Range   pH, Ven 7.361 (*) 7.250 - 7.300   pCO2, Ven 45.1  45.0 - 50.0 mmHg   pO2, Ven 32.0  30.0 - 45.0 mmHg   Bicarbonate 25.5 (*) 20.0 - 24.0 mEq/L   TCO2 27  0 - 100 mmol/L   O2 Saturation 58.0     Sample type VENOUS     Comment NOTIFIED PHYSICIAN    TROPONIN I     Status: None   Collection Time    08/07/13  8:52 PM      Result Value Ref Range   Troponin I <0.30  <0.30 ng/mL   Comment:            Due to the release kinetics of cTnI,     a negative result within the first hours     of the onset of symptoms does not rule out     myocardial infarction with certainty.     If myocardial infarction is still suspected,     repeat the test at appropriate intervals.  HEMOGLOBIN A1C     Status: Abnormal   Collection Time    08/07/13  8:52 PM      Result Value Ref Range   Hemoglobin A1C 10.9 (*) <5.7 %   Comment: (NOTE)                                                                               According  to the ADA Clinical Practice Recommendations for 2011, when     HbA1c is used as a screening test:      >=  6.5%   Diagnostic of Diabetes Mellitus               (if abnormal result is confirmed)     5.7-6.4%   Increased risk of developing Diabetes Mellitus     References:Diagnosis and Classification of Diabetes Mellitus,Diabetes     BJSE,8315,17(OHYWV 1):S62-S69 and Standards of Medical Care in             Diabetes - 2011,Diabetes Care,2011,34 (Suppl 1):S11-S61.   Mean Plasma Glucose 266 (*) <117 mg/dL   Comment: Performed at [facility]  TSH     Status: None   Collection Time    08/07/13  8:52 PM      Result Value Ref Range   TSH 2.173  0.350 - 4.500 uIU/mL   Comment: Performed at [facility]     Status: None   Collection Time    08/07/13  8:52 PM      Result Value Ref Range   Pro B Natriuretic peptide (BNP) 43.4  0 - 125 pg/mL  LIPID PANEL     Status: Abnormal   Collection Time    08/07/13  8:54 PM      Result Value Ref Range   Cholesterol 215 (*) 0 - 200 mg/dL   Triglycerides 475 (*) <150 mg/dL   HDL 22 (*) >39 mg/dL   Total CHOL/HDL Ratio 9.8     VLDL UNABLE TO CALCULATE IF TRIGLYCERIDE OVER 400 mg/dL  0 - 40 mg/dL   LDL Cholesterol UNABLE TO CALCULATE IF TRIGLYCERIDE OVER 400 mg/dL  0 - 99 mg/dL   Comment:            Total Cholesterol/HDL:CHD Risk     Coronary Heart Disease Risk Table                         Men   Women      1/2 Average Risk   3.4   3.3      Average Risk       5.0   4.4      2 X Average Risk   9.6   7.1      3 X Average Risk  23.4   11.0                Use the calculated Patient Ratio     above and the CHD Risk Table     to determine the patient's CHD Risk.                ATP III CLASSIFICATION (LDL):      <100     mg/dL   Optimal      100-129  mg/dL   Near or Above                        Optimal      130-159  mg/dL   Borderline      160-189  mg/dL   High      >190     mg/dL   Very High  CBG  MONITORING, ED     Status: Abnormal   Collection Time    08/07/13  9:16 PM      Result Value Ref Range   Glucose-Capillary 175 (*) 70 - 99 mg/dL  MRSA PCR SCREENING     Status: None   Collection Time    08/07/13 10:14 PM  Result Value Ref Range   MRSA by PCR NEGATIVE  NEGATIVE   Comment:            The GeneXpert MRSA Assay (FDA     approved for NASAL specimens     only), is one component of a     comprehensive MRSA colonization     surveillance program. It is not     intended to diagnose MRSA     infection nor to guide or     monitor treatment for     MRSA infections.  GLUCOSE, CAPILLARY     Status: Abnormal   Collection Time    08/07/13 10:48 PM      Result Value Ref Range   Glucose-Capillary 184 (*) 70 - 99 mg/dL  BASIC METABOLIC PANEL     Status: Abnormal   Collection Time    08/08/13 12:10 AM      Result Value Ref Range   Sodium 138  137 - 147 mEq/L   Potassium 4.1  3.7 - 5.3 mEq/L   Chloride 99  96 - 112 mEq/L   CO2 24  19 - 32 mEq/L   Glucose, Bld 190 (*) 70 - 99 mg/dL   BUN 23  6 - 23 mg/dL   Creatinine, Ser 0.91  0.50 - 1.35 mg/dL   Calcium 9.8  8.4 - 10.5 mg/dL   GFR calc non Af Amer 89 (*) >90 mL/min   GFR calc Af Amer >90  >90 mL/min   Comment: (NOTE)     The eGFR has been calculated using the CKD EPI equation.     This calculation has not been validated in all clinical situations.     eGFR's persistently <90 mL/min signify possible Chronic Kidney     Disease.  GLUCOSE, CAPILLARY     Status: Abnormal   Collection Time    08/08/13  1:28 AM      Result Value Ref Range   Glucose-Capillary 265 (*) 70 - 99 mg/dL  CBC WITH DIFFERENTIAL     Status: Abnormal   Collection Time    08/08/13  2:30 AM      Result Value Ref Range   WBC 11.3 (*) 4.0 - 10.5 K/uL   RBC 5.21  4.22 - 5.81 MIL/uL   Hemoglobin 15.8  13.0 - 17.0 g/dL   HCT 43.7  39.0 - 52.0 %   MCV 83.9  78.0 - 100.0 fL   MCH 30.3  26.0 - 34.0 pg   MCHC 36.2 (*) 30.0 - 36.0 g/dL   RDW 12.9  11.5 -  15.5 %   Platelets 213  150 - 400 K/uL   Neutrophils Relative % 64  43 - 77 %   Neutro Abs 7.2  1.7 - 7.7 K/uL   Lymphocytes Relative 26  12 - 46 %   Lymphs Abs 2.9  0.7 - 4.0 K/uL   Monocytes Relative 8  3 - 12 %   Monocytes Absolute 0.9  0.1 - 1.0 K/uL   Eosinophils Relative 2  0 - 5 %   Eosinophils Absolute 0.2  0.0 - 0.7 K/uL   Basophils Relative 0  0 - 1 %   Basophils Absolute 0.1  0.0 - 0.1 K/uL  COMPREHENSIVE METABOLIC PANEL     Status: Abnormal   Collection Time    08/08/13  2:30 AM      Result Value Ref Range   Sodium 137  137 - 147 mEq/L   Potassium 4.0  3.7 - 5.3 mEq/L   Chloride 100  96 - 112 mEq/L   CO2 22  19 - 32 mEq/L   Glucose, Bld 270 (*) 70 - 99 mg/dL   BUN 25 (*) 6 - 23 mg/dL   Creatinine, Ser 0.95  0.50 - 1.35 mg/dL   Calcium 9.4  8.4 - 10.5 mg/dL   Total Protein 6.5  6.0 - 8.3 g/dL   Albumin 3.5  3.5 - 5.2 g/dL   AST 15  0 - 37 U/L   ALT 16  0 - 53 U/L   Alkaline Phosphatase 54  39 - 117 U/L   Total Bilirubin 0.4  0.3 - 1.2 mg/dL   GFR calc non Af Amer 87 (*) >90 mL/min   GFR calc Af Amer >90  >90 mL/min   Comment: (NOTE)     The eGFR has been calculated using the CKD EPI equation.     This calculation has not been validated in all clinical situations.     eGFR's persistently <90 mL/min signify possible Chronic Kidney     Disease.  TROPONIN I     Status: None   Collection Time    08/08/13  2:30 AM      Result Value Ref Range   Troponin I <0.30  <0.30 ng/mL   Comment:            Due to the release kinetics of cTnI,     a negative result within the first hours     of the onset of symptoms does not rule out     myocardial infarction with certainty.     If myocardial infarction is still suspected,     repeat the test at appropriate intervals.  HEPARIN LEVEL (UNFRACTIONATED)     Status: Abnormal   Collection Time    08/08/13  2:30 AM      Result Value Ref Range   Heparin Unfractionated 0.18 (*) 0.30 - 0.70 IU/mL   Comment:            IF HEPARIN  RESULTS ARE BELOW     EXPECTED VALUES, AND PATIENT     DOSAGE HAS BEEN CONFIRMED,     SUGGEST FOLLOW UP TESTING     OF ANTITHROMBIN III LEVELS.  GLUCOSE, CAPILLARY     Status: Abnormal   Collection Time    08/08/13  2:32 AM      Result Value Ref Range   Glucose-Capillary 281 (*) 70 - 99 mg/dL  GLUCOSE, CAPILLARY     Status: Abnormal   Collection Time    08/08/13  4:29 AM      Result Value Ref Range   Glucose-Capillary 248 (*) 70 - 99 mg/dL  GLUCOSE, CAPILLARY     Status: Abnormal   Collection Time    08/08/13  5:34 AM      Result Value Ref Range   Glucose-Capillary 237 (*) 70 - 99 mg/dL  GLUCOSE, CAPILLARY     Status: Abnormal   Collection Time    08/08/13  6:30 AM      Result Value Ref Range   Glucose-Capillary 236 (*) 70 - 99 mg/dL  GLUCOSE, CAPILLARY     Status: Abnormal   Collection Time    08/08/13  7:33 AM      Result Value Ref Range   Glucose-Capillary 212 (*) 70 - 99 mg/dL  GLUCOSE, CAPILLARY     Status: Abnormal   Collection Time    08/08/13  8:32 AM  Result Value Ref Range   Glucose-Capillary 169 (*) 70 - 99 mg/dL  TROPONIN I     Status: None   Collection Time    08/08/13  8:52 AM      Result Value Ref Range   Troponin I <0.30  <0.30 ng/mL   Comment:            Due to the release kinetics of cTnI,     a negative result within the first hours     of the onset of symptoms does not rule out     myocardial infarction with certainty.     If myocardial infarction is still suspected,     repeat the test at appropriate intervals.  GLUCOSE, CAPILLARY     Status: Abnormal   Collection Time    08/08/13  9:43 AM      Result Value Ref Range   Glucose-Capillary 154 (*) 70 - 99 mg/dL  HEPARIN LEVEL (UNFRACTIONATED)     Status: Abnormal   Collection Time    08/08/13  9:58 AM      Result Value Ref Range   Heparin Unfractionated 0.29 (*) 0.30 - 0.70 IU/mL   Comment:            IF HEPARIN RESULTS ARE BELOW     EXPECTED VALUES, AND PATIENT     DOSAGE HAS BEEN  CONFIRMED,     SUGGEST FOLLOW UP TESTING     OF ANTITHROMBIN III LEVELS.  GLUCOSE, CAPILLARY     Status: Abnormal   Collection Time    08/08/13 10:39 AM      Result Value Ref Range   Glucose-Capillary 149 (*) 70 - 99 mg/dL  GLUCOSE, CAPILLARY     Status: Abnormal   Collection Time    08/08/13 11:48 AM      Result Value Ref Range   Glucose-Capillary 148 (*) 70 - 99 mg/dL  BASIC METABOLIC PANEL     Status: Abnormal   Collection Time    08/08/13 12:00 PM      Result Value Ref Range   Sodium 137  137 - 147 mEq/L   Potassium 4.3  3.7 - 5.3 mEq/L   Chloride 101  96 - 112 mEq/L   CO2 25  19 - 32 mEq/L   Glucose, Bld 142 (*) 70 - 99 mg/dL   BUN 26 (*) 6 - 23 mg/dL   Creatinine, Ser 1.10  0.50 - 1.35 mg/dL   Calcium 9.6  8.4 - 10.5 mg/dL   GFR calc non Af Amer 70 (*) >90 mL/min   GFR calc Af Amer 81 (*) >90 mL/min   Comment: (NOTE)     The eGFR has been calculated using the CKD EPI equation.     This calculation has not been validated in all clinical situations.     eGFR's persistently <90 mL/min signify possible Chronic Kidney     Disease.  GLUCOSE, CAPILLARY     Status: Abnormal   Collection Time    08/08/13 12:54 PM      Result Value Ref Range   Glucose-Capillary 135 (*) 70 - 99 mg/dL    Imaging: Imaging results have been reviewed and Dg Chest 2 View  08/07/2013   CLINICAL DATA:  Weakness.  Syncope.  Chest pain  EXAM: CHEST  2 VIEW  COMPARISON:  NM MYOCAR MULTI w/SPECT w/WALL MOTION / EF dated 10/27/2011; DG CHEST 1V PORT dated 10/25/2011  FINDINGS: The lungs appear clear.  Cardiac and mediastinal contours  normal.  No pleural effusion identified.  Thoracic spondylosis noted.  IMPRESSION: No active cardiopulmonary disease.   Electronically Signed   By: Sherryl Barters M.D.   On: 08/07/2013 18:45   Ct Head Wo Contrast  08/07/2013   CLINICAL DATA:  Headaches and dizziness.  EXAM: CT HEAD WITHOUT CONTRAST  TECHNIQUE: Contiguous axial images were obtained from the base of the skull  through the vertex without intravenous contrast.  COMPARISON:  None.  FINDINGS: There is no evidence of acute cortical infarct, intracranial hemorrhage, mass, midline shift, or extra-axial fluid collection. Patchy periventricular white matter hypodensities are nonspecific but compatible with mild chronic small vessel ischemic disease. Ventricles and sulci are within normal limits for age. Orbits are unremarkable. Mastoid air cells and visualized paranasal sinuses are clear.  IMPRESSION: No evidence of acute intracranial abnormality. Mild chronic small vessel ischemic disease.   Electronically Signed   By: Logan Bores   On: 08/07/2013 18:04    Assessment:  1. Active Problems: 2.   HYPERLIPIDEMIA-MIXED 3.   Essential hypertension, benign 4.   Diabetes mellitus 5.   CAD S/P percutaneous coronary angioplasty 6.   Chest pain 7.   Headache 8.   Diaphoresis 9.   Plan:  1. Symptomatic events are most consistent with neurally mediated syncope - long prodrome with vagal symptoms before loss of consciousness 2. Polyuria due to hyperglycemia and [place] may be making him hypovolemic and prone to vagal events 3. Atypical interscapular pain is similar to old angina and he reports syncope with prior MI, but otherwise, there are no signs of a true acute coronary syndrome. It is reasonable to do a nuclear perfusion study. 4. If unrevealing, 30 day event monitor or even implantable loop recorder as outpatient  Time Spent Directly with Patient:  45 minutes  Length of Stay:  LOS: 1 day    [name] [name] 08/08/2013, 3:15 PM

## 2013-08-09 ENCOUNTER — Other Ambulatory Visit: Payer: Self-pay | Admitting: Cardiology

## 2013-08-09 ENCOUNTER — Inpatient Hospital Stay (HOSPITAL_COMMUNITY): Payer: Medicare Other

## 2013-08-09 DIAGNOSIS — R079 Chest pain, unspecified: Secondary | ICD-10-CM

## 2013-08-09 DIAGNOSIS — R55 Syncope and collapse: Secondary | ICD-10-CM

## 2013-08-09 DIAGNOSIS — I10 Essential (primary) hypertension: Secondary | ICD-10-CM

## 2013-08-09 DIAGNOSIS — E119 Type 2 diabetes mellitus without complications: Secondary | ICD-10-CM

## 2013-08-09 DIAGNOSIS — R61 Generalized hyperhidrosis: Secondary | ICD-10-CM

## 2013-08-09 LAB — CBC WITH DIFFERENTIAL/PLATELET
Basophils Absolute: 0.1 10*3/uL (ref 0.0–0.1)
Basophils Relative: 1 % (ref 0–1)
Eosinophils Absolute: 0.2 10*3/uL (ref 0.0–0.7)
Eosinophils Relative: 2 % (ref 0–5)
HCT: 43 % (ref 39.0–52.0)
Hemoglobin: 15.2 g/dL (ref 13.0–17.0)
Lymphocytes Relative: 24 % (ref 12–46)
Lymphs Abs: 2.3 10*3/uL (ref 0.7–4.0)
MCH: 29.8 pg (ref 26.0–34.0)
MCHC: 35.3 g/dL (ref 30.0–36.0)
MCV: 84.3 fL (ref 78.0–100.0)
Monocytes Absolute: 1 10*3/uL (ref 0.1–1.0)
Monocytes Relative: 11 % (ref 3–12)
Neutro Abs: 6.3 10*3/uL (ref 1.7–7.7)
Neutrophils Relative %: 63 % (ref 43–77)
Platelets: 224 10*3/uL (ref 150–400)
RBC: 5.1 MIL/uL (ref 4.22–5.81)
RDW: 12.8 % (ref 11.5–15.5)
WBC: 9.9 10*3/uL (ref 4.0–10.5)

## 2013-08-09 LAB — GLUCOSE, CAPILLARY
Glucose-Capillary: 222 mg/dL — ABNORMAL HIGH (ref 70–99)
Glucose-Capillary: 198 mg/dL — ABNORMAL HIGH (ref 70–99)

## 2013-08-09 LAB — COMPREHENSIVE METABOLIC PANEL
ALT: 20 U/L (ref 0–53)
AST: 14 U/L (ref 0–37)
Albumin: 3.3 g/dL — ABNORMAL LOW (ref 3.5–5.2)
Alkaline Phosphatase: 56 U/L (ref 39–117)
BUN: 20 mg/dL (ref 6–23)
CO2: 24 mEq/L (ref 19–32)
Calcium: 9.4 mg/dL (ref 8.4–10.5)
Chloride: 97 mEq/L (ref 96–112)
Creatinine, Ser: 0.96 mg/dL (ref 0.50–1.35)
GFR calc Af Amer: >90 mL/min (ref >90)
GFR calc non Af Amer: 87 mL/min — ABNORMAL LOW (ref >90)
Glucose, Bld: 279 mg/dL — ABNORMAL HIGH (ref 70–99)
Potassium: 4.2 mEq/L (ref 3.7–5.3)
Sodium: 133 mEq/L — ABNORMAL LOW (ref 137–147)
Total Bilirubin: 0.5 mg/dL (ref 0.3–1.2)
Total Protein: 6.6 g/dL (ref 6.0–8.3)

## 2013-08-09 MED ORDER — REGADENOSON 0.4 MG/5ML IV SOLN
0.4000 mg | Freq: Once | INTRAVENOUS | Status: AC
  Administered 2013-08-09: 0.4 mg via INTRAVENOUS

## 2013-08-09 MED ORDER — INSULIN ASPART 100 UNIT/ML FLEXPEN: 3.0000 [IU] | PEN_INJECTOR | Freq: Three times a day (TID) | SUBCUTANEOUS | Status: DC

## 2013-08-09 MED ORDER — REGADENOSON 0.4 MG/5ML IV SOLN
INTRAVENOUS | Status: AC
  Administered 2013-08-09: 0.4 mg via INTRAVENOUS
  Filled 2013-08-09: qty 5

## 2013-08-09 MED ORDER — TECHNETIUM TC 99M SESTAMIBI GENERIC - CARDIOLITE
10.0000 | Freq: Once | INTRAVENOUS | Status: AC | PRN
  Administered 2013-08-09: 10 via INTRAVENOUS

## 2013-08-09 MED ORDER — TECHNETIUM TC 99M SESTAMIBI GENERIC - CARDIOLITE
30.0000 | Freq: Once | INTRAVENOUS | Status: AC | PRN
  Administered 2013-08-09: 30 via INTRAVENOUS

## 2013-08-09 MED ORDER — METOPROLOL TARTRATE 25 MG PO TABS: 25.0000 mg | ORAL_TABLET | Freq: Two times a day (BID) | ORAL | Status: DC

## 2013-08-09 MED ORDER — INSULIN ASPART 100 UNIT/ML ~~LOC~~ SOLN: 4.0000 [IU] | Freq: Three times a day (TID) | SUBCUTANEOUS | Status: DC

## 2013-08-09 MED ORDER — LISINOPRIL 2.5 MG PO TABS: 2.5000 mg | ORAL_TABLET | Freq: Every day | ORAL | Status: DC

## 2013-08-09 NOTE — Progress Notes (Signed)
[facility] PROGRESS NOTE  [name] [name] KYH:[id number] DOB: 1951/03/10 DOA: 08/07/2013 PCP: [name], FNP  Assessment/Plan: 63 y.o. year old male with multiple medical problems includin CAD s/p stents (approx 9 years ago per pt), poorly controlled IDDM, HLD, HTN presenting with syncopal episode and chest pain. Pt states that he was out to dinner on Friday night. After dinner, pt had progressive onset of central chest pressure, nausea, diaphoresis. Sxs persisted until he got home where pt had a witnessed syncopal episode.    Active Problems:  1. Syncope; CT unremarkable; neuro exam no focal;  - possibly vaso vagal, no new events   2. HYPERLIPIDEMIA-MIXED  - continue statin  3. Essential hypertension, benign  - reasonable inpatient control  - pt has history of non compliance and says he has had BP in 90's/60's at home and does note want to take BP medications  4. Diabetes mellitus  - uncontrolled and with A1C > 10; recommended meal time insulin; patient would like to think about it  5. CAD S/P percutaneous coronary angioplasty  - per cardio; pend stress test     Possible d/c today   Consultants:  Cardiology  Procedures/Studies:  Dg Chest 2 View 08/07/2013 No active cardiopulmonary disease.  Ct Head Wo Contrast 08/07/2013 No evidence of acute intracranial abnormality. Mild chronic small vessel ischemic disease.  Antibiotics:  None    Code Status: full Family Communication: d/w aptient, his wife (indicate person spoken with, relationship, and if by phone, the number) Disposition Plan: home 24-48 hours    Consultants:  Cardiology   Procedures:  None   Antibiotics:  None  (indicate start date, and stop date if known)  HPI/Subjective: alert  Objective: Filed Vitals:   08/09/13 0944  BP: 155/72  Pulse:   Temp:   Resp:     Intake/Output Summary (Last 24 hours) at 08/09/13 1245 Last data filed at 08/08/13 2111  Gross per 24 hour  Intake    603 ml   Output    900 ml  Net   -297 ml   Filed Weights   08/07/13 2030 08/08/13 0432 08/09/13 0526  Weight: 115.1 kg (253 lb 12 oz) 114.1 kg (251 lb 8.7 oz) 113.626 kg (250 lb 8 oz)    Exam:   General:  alert  Cardiovascular: s1,s2 rrr  Respiratory: CTA BL  Abdomen: soft, nt,nd  Musculoskeletal: no LE edema   Data Reviewed: Basic Metabolic Panel:  Recent Labs Lab 08/07/13 1732 08/08/13 0010 08/08/13 0230 08/08/13 1200 08/09/13 0441  NA 138 138 137 137 133*  K 4.8 4.1 4.0 4.3 4.2  CL 99 99 100 101 97  CO2 22 24 22 25 24   GLUCOSE 256* 190* 270* 142* 279*  BUN 26* 23 25* 26* 20  CREATININE 0.91 0.91 0.95 1.10 0.96  CALCIUM 10.3 9.8 9.4 9.6 9.4   Liver Function Tests:  Recent Labs Lab 08/07/13 1732 08/08/13 0230 08/09/13 0441  AST 15 15 14   ALT 19 16 20   ALKPHOS 65 54 56  BILITOT 0.3 0.4 0.5  PROT 7.9 6.5 6.6  ALBUMIN 4.1 3.5 3.3*    Recent Labs Lab 08/07/13 1732  LIPASE 23   No results found for this basename: AMMONIA,  in the last 168 hours CBC:  Recent Labs Lab 08/07/13 1732 08/08/13 0230 08/09/13 0441  WBC 12.1* 11.3* 9.9  NEUTROABS 8.1* 7.2 6.3  HGB 17.7* 15.8 15.2  HCT 48.5 43.7 43.0  MCV 83.2 83.9 84.3  PLT 236  213 224   Cardiac Enzymes:  Recent Labs Lab 08/07/13 1732 08/07/13 2052 08/08/13 0230 08/08/13 0852  TROPONINI <0.30 <0.30 <0.30 <0.30   BNP (last 3 results)  Recent Labs  08/07/13 2052  PROBNP 43.4   CBG:  Recent Labs Lab 08/08/13 1254 08/08/13 1643 08/08/13 2019 08/09/13 0726 08/09/13 1156  GLUCAP 135* 233* 228* 222* 198*    Recent Results (from the past 240 hour(s))  MRSA PCR SCREENING     Status: None   Collection Time    08/07/13 10:14 PM      Result Value Ref Range Status   MRSA by PCR NEGATIVE  NEGATIVE Final   Comment:            The GeneXpert MRSA Assay (FDA     approved for NASAL specimens     only), is one component of a     comprehensive MRSA colonization     surveillance program. It is  not     intended to diagnose MRSA     infection nor to guide or     monitor treatment for     MRSA infections.     Studies: Dg Chest 2 View  08/07/2013   CLINICAL DATA:  Weakness.  Syncope.  Chest pain  EXAM: CHEST  2 VIEW  COMPARISON:  NM MYOCAR MULTI w/SPECT w/WALL MOTION / EF dated 10/27/2011; DG CHEST 1V PORT dated 10/25/2011  FINDINGS: The lungs appear clear.  Cardiac and mediastinal contours normal.  No pleural effusion identified.  Thoracic spondylosis noted.  IMPRESSION: No active cardiopulmonary disease.   Electronically Signed   By: Sherryl Barters M.D.   On: 08/07/2013 18:45   Ct Head Wo Contrast  08/07/2013   CLINICAL DATA:  Headaches and dizziness.  EXAM: CT HEAD WITHOUT CONTRAST  TECHNIQUE: Contiguous axial images were obtained from the base of the skull through the vertex without intravenous contrast.  COMPARISON:  None.  FINDINGS: There is no evidence of acute cortical infarct, intracranial hemorrhage, mass, midline shift, or extra-axial fluid collection. Patchy periventricular white matter hypodensities are nonspecific but compatible with mild chronic small vessel ischemic disease. Ventricles and sulci are within normal limits for age. Orbits are unremarkable. Mastoid air cells and visualized paranasal sinuses are clear.  IMPRESSION: No evidence of acute intracranial abnormality. Mild chronic small vessel ischemic disease.   Electronically Signed   By: Logan Bores   On: 08/07/2013 18:04    Scheduled Meds: . aspirin  324 mg Oral Once  . gabapentin  300 mg Oral Daily  . insulin aspart  0-15 Units Subcutaneous TID WC  . insulin aspart  0-5 Units Subcutaneous QHS  . insulin aspart  4 Units Subcutaneous TID WC  . insulin detemir  40 Units Subcutaneous QHS  . insulin regular  0-10 Units Intravenous TID WC  . metoprolol tartrate  25 mg Oral BID  . simvastatin  10 mg Oral q1800  . sodium chloride  3 mL Intravenous Q12H   Continuous Infusions: . insulin (NOVOLIN-R) infusion Stopped  (08/08/13 1448)    Principal Problem:   Chest pain Active Problems:   HYPERLIPIDEMIA-MIXED   Essential hypertension, benign   Diabetes mellitus   CAD S/P percutaneous coronary angioplasty   Headache   Diaphoresis   Syncope    Time spent: >35 minutes     Kinnie Feil  [facility] Pager [phone]. If 7PM-7AM, please contact night-coverage at [email or web address], password [facility] 08/09/2013, 12:45 PM  LOS: 2 days

## 2013-08-09 NOTE — Progress Notes (Signed)
63 y.o. male with a history of CAD s/p DES (? 14yrs ago), diabetes, HTN, HLD, GERD, and obesity who was sent to the ED from his family medicine doctor today for chest pain. He made an appointment with his PCP after an episode of syncope on Friday night with a residual headache and chest pain.  Negative MI.  Symptomatic events are most consistent with neurally mediated syncope - long prodrome with vagal symptoms before loss of consciousness.  Atypical interscapular pain is similar to old angina and he reports syncope with prior MI, but otherwise, there are no signs of a true acute coronary syndrome. It is reasonable to do a nuclear perfusion study. If unrevealing, 30 day event monitor or even implantable loop recorder as outpatient   Subjective: no further chest pain, some headache.  Objective: Vital signs in last 24 hours: Temp:  [97.7 F (36.5 C)-98.2 F (36.8 C)] 98.2 F (36.8 C) (03/25 0526) Pulse Rate:  [62-78] 62 (03/25 0526) Resp:  [10-18] 18 (03/24 1809) BP: (132-159)/(75-88) 143/88 mmHg (03/25 0526) SpO2:  [96 %-99 %] 98 % (03/25 0526) Weight:  [250 lb 8 oz (113.626 kg)] 250 lb 8 oz (113.626 kg) (03/25 0526) Weight change: -3 lb 4 oz (-1.474 kg) Last BM Date: 08/07/13 Intake/Output from previous day: 03/24 0701 - 03/25 0700 In: 610.6 [P.O.:600; I.V.:10.6] Out: 900 [Urine:900] Intake/Output this shift:    PE: General:Pleasant affect, NAD Skin:Warm and dry, brisk capillary refill HEENT:normocephalic, sclera clear, mucus membranes moist Heart:S1S2 RRR without murmur, gallup, rub or click Lungs:clear, ant, without rales, rhonchi, or wheezes WEX:HBZJI, soft, non tender, + BS, do not palpate liver spleen or masses Ext:no lower ext edema, 2+ pedal pulses, 2+ radial pulses Neuro:alert and oriented, MAE, follows commands, + facial symmetry   Lab Results:  Recent Labs  08/08/13 0230 08/09/13 0441  WBC 11.3* 9.9  HGB 15.8 15.2  HCT 43.7 43.0  PLT 213 224    BMET  Recent Labs  08/08/13 1200 08/09/13 0441  NA 137 133*  K 4.3 4.2  CL 101 97  CO2 25 24  GLUCOSE 142* 279*  BUN 26* 20  CREATININE 1.10 0.96  CALCIUM 9.6 9.4    Recent Labs  08/08/13 0230 08/08/13 0852  TROPONINI <0.30 <0.30    Lab Results  Component Value Date   CHOL 215* 08/07/2013   HDL 22* 08/07/2013   LDLCALC UNABLE TO CALCULATE IF TRIGLYCERIDE OVER 400 mg/dL 08/07/2013   TRIG 475* 08/07/2013   CHOLHDL 9.8 08/07/2013   Lab Results  Component Value Date   HGBA1C 10.9* 08/07/2013     Lab Results  Component Value Date   TSH 2.173 08/07/2013    Hepatic Function Panel  Recent Labs  08/09/13 0441  PROT 6.6  ALBUMIN 3.3*  AST 14  ALT 20  ALKPHOS 56  BILITOT 0.5    Recent Labs  08/07/13 2054  CHOL 215*   No results found for this basename: PROTIME,  in the last 72 hours     Studies/Results: Dg Chest 2 View  08/07/2013   CLINICAL DATA:  Weakness.  Syncope.  Chest pain  EXAM: CHEST  2 VIEW  COMPARISON:  NM MYOCAR MULTI w/SPECT w/WALL MOTION / EF dated 10/27/2011; DG CHEST 1V PORT dated 10/25/2011  FINDINGS: The lungs appear clear.  Cardiac and mediastinal contours normal.  No pleural effusion identified.  Thoracic spondylosis noted.  IMPRESSION: No active cardiopulmonary disease.   Electronically Signed   By: Franchot Mimes  Janeece Fitting M.D.   On: 08/07/2013 18:45   Ct Head Wo Contrast  08/07/2013   CLINICAL DATA:  Headaches and dizziness.  EXAM: CT HEAD WITHOUT CONTRAST  TECHNIQUE: Contiguous axial images were obtained from the base of the skull through the vertex without intravenous contrast.  COMPARISON:  None.  FINDINGS: There is no evidence of acute cortical infarct, intracranial hemorrhage, mass, midline shift, or extra-axial fluid collection. Patchy periventricular white matter hypodensities are nonspecific but compatible with mild chronic small vessel ischemic disease. Ventricles and sulci are within normal limits for age. Orbits are unremarkable. Mastoid air  cells and visualized paranasal sinuses are clear.  IMPRESSION: No evidence of acute intracranial abnormality. Mild chronic small vessel ischemic disease.   Electronically Signed   By: Logan Bores   On: 08/07/2013 18:04    Medications: I have reviewed the patient's current medications. Scheduled Meds: . aspirin  324 mg Oral Once  . gabapentin  300 mg Oral Daily  . insulin aspart  0-15 Units Subcutaneous TID WC  . insulin aspart  0-5 Units Subcutaneous QHS  . insulin aspart  4 Units Subcutaneous TID WC  . insulin detemir  40 Units Subcutaneous QHS  . insulin regular  0-10 Units Intravenous TID WC  . metoprolol tartrate  25 mg Oral BID  . regadenoson      . simvastatin  10 mg Oral q1800  . sodium chloride  3 mL Intravenous Q12H   Continuous Infusions: . insulin (NOVOLIN-R) infusion Stopped (08/08/13 1448)   PRN Meds:.dextrose, oxyCODONE-acetaminophen  Assessment/Plan: Principal Problem:   Chest pain Active Problems:   HYPERLIPIDEMIA-MIXED   Essential hypertension, benign   Diabetes mellitus   CAD S/P percutaneous coronary angioplasty   Headache   Diaphoresis   Syncope  PLAN: 1. Chest pain:  Negative MI, lexiscan myoview completed without complication.  Hx of CAD-DES to RCA in 2010.  Atypical interscapular pain is similar to old angina and he reports syncope with prior MI, but otherwise, there are no signs of a true acute coronary syndrome (normal stress test 2013)  2. Syncope neurally mediated- long prodrome with vagal symptoms before loss of consciousness.  30 day event monitor or even implantable loop recorder as outpatient  3. DM followed by attending- glucose elevated on admit.  4. Hyperlipidemia on statin  LOS: 2 days    [facility]  Nurse Practitioner Certified Pager [phone] or after 5pm and on weekends call [phone] 08/09/2013, 9:35 AM  Patient seen, examined. Available data reviewed. Agree with findings, assessment, and plan as outlined by Cecilie Kicks, NP.  Exam reveals a pleasant, obese male in no distress. Lungs are clear. Heart is regular rate and rhythm. There is no peripheral edema. His Myoview scan is currently pending. Symptoms have some typical and atypical features. If his Myoview is low risk, I think he can be discharged home. I agree that a 30 day event monitor is indicated for outpatient evaluation of syncope.  [name], M.D. 08/09/2013 2:52 PM

## 2013-08-09 NOTE — Progress Notes (Signed)
Inpatient Diabetes Program Recommendations  AACE/ADA: New Consensus Statement on Inpatient Glycemic Control (2013)  Target Ranges:  Prepandial:   less than 140 mg/dL      Peak postprandial:   less than 180 mg/dL (1-2 hours)      Critically ill patients:  140 - 180 mg/dL   Reason for Visit: Spoke to patient regarding home management of diabetes.  Patient states that he see's NP, Rocco Serene in [place] for diabetes management.  He was started on Invokana on 07/12/13 and Januvia 100 mg daily.  He also continued to take Levemir 40 units bid.  He states that upon taking Invokana he started urinating more frequently and waking hourly at night.  I looked at his home glucose log, which indicated most CBG's in the 200-300 range.  He admits that "my diet sucks" and he is frustrated with the changes in his medications which have not improved his CBG's.  He is interested in seeing a diabetes specialist possibly in [place].  Will give them number for Dr. Dorris Fetch.    Agree with the addition of Novolog meal coverage. May consider increasing meal coverage to 10 units tid with meals.  Also agree with the reduction of Levemir basal insulin to 40 units daily.  He needs lots of lifestyle education.  Will refer for diet education at [facility] per protocol.    Please consider not resuming Invokana.    Thanks, Adah Perl, RN, BC-ADM Inpatient Diabetes Coordinator Pager [phone]

## 2013-08-09 NOTE — Discharge Summary (Addendum)
Physician Discharge Summary  [name] [name] NKN:[id number] DOB: Oct 30, 1950 DOA: 08/07/2013  PCP: [name], FNP  Admit date: 08/07/2013 Discharge date: 08/09/2013  Time spent: >35 minutes  Recommendations for Outpatient Follow-up:  F/u with PCP in 1-2 weeks F/u with cardiology OP event monitor  Discharge Diagnoses:  Principal Problem:   Chest pain Active Problems:   HYPERLIPIDEMIA-MIXED   Essential hypertension, benign   Diabetes mellitus   CAD S/P percutaneous coronary angioplasty   Headache   Diaphoresis   Syncope   Discharge Condition: stable   Diet recommendation: DM  Filed Weights   08/07/13 2030 08/08/13 0432 08/09/13 0526  Weight: 115.1 kg (253 lb 12 oz) 114.1 kg (251 lb 8.7 oz) 113.626 kg (250 lb 8 oz)    History of present illness:  63 y.o. year old male with multiple medical problems includin CAD s/p stents (approx 9 years ago per pt), poorly controlled IDDM, HLD, HTN presenting with syncopal episode and chest pain. Pt states that he was out to dinner on Friday night. After dinner, pt had progressive onset of central chest pressure, nausea, diaphoresis. Sxs persisted until he got home where pt had a witnessed syncopal episode.   Hospital Course: Active Problems:  1. Syncope; CT unremarkable; neuro exam no focal;  - possibly vaso vagal, no new events; OP event monitor is being arranged by cardiology  2. HYPERLIPIDEMIA-MIXED  - continue statin  3. Essential hypertension, benign  - reasonable inpatient control  - pt has history of non compliance and says he has had BP in 90's/60's at home and does note want to take BP medications  4. Diabetes mellitus  - uncontrolled and with A1C > 10; recommended meal time insulin; OP titration as needed   5. CAD S/P percutaneous coronary angioplasty  - stress test no significant change per cardiology; cont medical management     Procedures:  none (i.e. Studies not automatically included, echos, thoracentesis, etc; not  x-rays)  Consultations:  Cardiology   Discharge Exam: Filed Vitals:   08/09/13 1249  BP: 131/74  Pulse: 59  Temp: 98.5 F (36.9 C)  Resp: 20    General: alert Cardiovascular: s1,s2 rrr Respiratory: CTA BL  Discharge Instructions   Future Appointments Provider Department Dept Phone   08/30/2013 8:30 AM [name], FNP [name] [phone]       Medication List         aspirin EC 81 MG tablet  Take 81 mg by mouth daily.     Fish Oil 1200 MG Caps  Take 2 capsules by mouth 2 (two) times daily.     gabapentin 300 MG capsule  Commonly known as:  NEURONTIN  Take 300 mg by mouth daily.     insulin aspart 100 UNIT/ML FlexPen  Commonly known as:  NOVOLOG  Inject 3 Units into the skin 3 (three) times daily with meals.     Insulin Detemir 100 UNIT/ML Pen  Commonly known as:  LEVEMIR  Inject 40 Units into the skin 2 (two) times daily.     lisinopril 2.5 MG tablet  Commonly known as:  ZESTRIL  Take 1 tablet (2.5 mg total) by mouth daily.     metoprolol tartrate 25 MG tablet  Commonly known as:  LOPRESSOR  Take 1 tablet (25 mg total) by mouth 2 (two) times daily.     omeprazole 20 MG capsule  Commonly known as:  PRILOSEC  Take 1 capsule (20 mg total) by mouth daily.  pravastatin 20 MG tablet  Commonly known as:  PRAVACHOL  Take 40 mg by mouth daily.     sitaGLIPtin 100 MG tablet  Commonly known as:  JANUVIA  Take 1 tablet (100 mg total) by mouth daily.       Allergies  Allergen Reactions  . Ace Inhibitors Other (See Comments) and Cough    CKD, renal failure   . Metformin And Related Itching  . Horse-Derived Products     REACTION: Rash  . Crestor [Rosuvastatin] Other (See Comments)    Myalgias   . Lipitor [Atorvastatin] Other (See Comments)    myalgias       Follow-up Information   Follow up with [name], FNP In 1 week.   Specialty:  Family Medicine   Contact information:   [place]  [address] [phone]        The results of significant diagnostics from this hospitalization (including imaging, microbiology, ancillary and laboratory) are listed below for reference.    Significant Diagnostic Studies: Dg Chest 2 View  08/07/2013   CLINICAL DATA:  Weakness.  Syncope.  Chest pain  EXAM: CHEST  2 VIEW  COMPARISON:  NM MYOCAR MULTI w/SPECT w/WALL MOTION / EF dated 10/27/2011; DG CHEST 1V PORT dated 10/25/2011  FINDINGS: The lungs appear clear.  Cardiac and mediastinal contours normal.  No pleural effusion identified.  Thoracic spondylosis noted.  IMPRESSION: No active cardiopulmonary disease.   Electronically Signed   By: Sherryl Barters M.D.   On: 08/07/2013 18:45   Ct Head Wo Contrast  08/07/2013   CLINICAL DATA:  Headaches and dizziness.  EXAM: CT HEAD WITHOUT CONTRAST  TECHNIQUE: Contiguous axial images were obtained from the base of the skull through the vertex without intravenous contrast.  COMPARISON:  None.  FINDINGS: There is no evidence of acute cortical infarct, intracranial hemorrhage, mass, midline shift, or extra-axial fluid collection. Patchy periventricular white matter hypodensities are nonspecific but compatible with mild chronic small vessel ischemic disease. Ventricles and sulci are within normal limits for age. Orbits are unremarkable. Mastoid air cells and visualized paranasal sinuses are clear.  IMPRESSION: No evidence of acute intracranial abnormality. Mild chronic small vessel ischemic disease.   Electronically Signed   By: Logan Bores   On: 08/07/2013 18:04    Microbiology: Recent Results (from the past 240 hour(s))  MRSA PCR SCREENING     Status: None   Collection Time    08/07/13 10:14 PM      Result Value Ref Range Status   MRSA by PCR NEGATIVE  NEGATIVE Final   Comment:            The GeneXpert MRSA Assay (FDA     approved for NASAL specimens     only), is one component of a     comprehensive MRSA colonization     surveillance program. It is not      intended to diagnose MRSA     infection nor to guide or     monitor treatment for     MRSA infections.     Labs: Basic Metabolic Panel:  Recent Labs Lab 08/07/13 1732 08/08/13 0010 08/08/13 0230 08/08/13 1200 08/09/13 0441  NA 138 138 137 137 133*  K 4.8 4.1 4.0 4.3 4.2  CL 99 99 100 101 97  CO2 22 24 22 25 24   GLUCOSE 256* 190* 270* 142* 279*  BUN 26* 23 25* 26* 20  CREATININE 0.91 0.91 0.95 1.10 0.96  CALCIUM  10.3 9.8 9.4 9.6 9.4   Liver Function Tests:  Recent Labs Lab 08/07/13 1732 08/08/13 0230 08/09/13 0441  AST 15 15 14   ALT 19 16 20   ALKPHOS 65 54 56  BILITOT 0.3 0.4 0.5  PROT 7.9 6.5 6.6  ALBUMIN 4.1 3.5 3.3*    Recent Labs Lab 08/07/13 1732  LIPASE 23   No results found for this basename: AMMONIA,  in the last 168 hours CBC:  Recent Labs Lab 08/07/13 1732 08/08/13 0230 08/09/13 0441  WBC 12.1* 11.3* 9.9  NEUTROABS 8.1* 7.2 6.3  HGB 17.7* 15.8 15.2  HCT 48.5 43.7 43.0  MCV 83.2 83.9 84.3  PLT 236 213 224   Cardiac Enzymes:  Recent Labs Lab 08/07/13 1732 08/07/13 2052 08/08/13 0230 08/08/13 0852  TROPONINI <0.30 <0.30 <0.30 <0.30   BNP: BNP (last 3 results)  Recent Labs  08/07/13 2052  PROBNP 43.4   CBG:  Recent Labs Lab 08/08/13 1254 08/08/13 1643 08/08/13 2019 08/09/13 0726 08/09/13 1156  GLUCAP 135* 233* 228* 222* 198*       Signed:  Rowe Clack N  [facility] 08/09/2013, 3:17 PM

## 2013-08-10 ENCOUNTER — Telehealth: Payer: Self-pay | Admitting: Family Medicine

## 2013-08-10 NOTE — Telephone Encounter (Signed)
appt made

## 2013-08-11 ENCOUNTER — Encounter (INDEPENDENT_AMBULATORY_CARE_PROVIDER_SITE_OTHER): Payer: Medicare Other

## 2013-08-11 ENCOUNTER — Encounter: Payer: Self-pay | Admitting: Radiology

## 2013-08-11 DIAGNOSIS — R55 Syncope and collapse: Secondary | ICD-10-CM

## 2013-08-11 NOTE — Progress Notes (Signed)
Patient ID: [name] [name], male   DOB: 09/24/1950, 63 y.o.   MRN: [id number] E cardio 30 day event monitor applied

## 2013-08-14 ENCOUNTER — Telehealth: Payer: Self-pay | Admitting: Pharmacist

## 2013-08-14 MED ORDER — SITAGLIPTIN PHOSPHATE 100 MG PO TABS: 100.0000 mg | ORAL_TABLET | Freq: Every day | ORAL | Status: DC

## 2013-08-14 NOTE — Telephone Encounter (Signed)
rx sent to pharmacy. Patient notified. 

## 2013-08-17 ENCOUNTER — Ambulatory Visit (INDEPENDENT_AMBULATORY_CARE_PROVIDER_SITE_OTHER): Payer: Medicare Other | Admitting: Family Medicine

## 2013-08-17 VITALS — BP 150/76 | HR 53 | Temp 97.8°F | Ht 74.0 in | Wt 257.2 lb

## 2013-08-17 DIAGNOSIS — E119 Type 2 diabetes mellitus without complications: Secondary | ICD-10-CM

## 2013-08-17 NOTE — Progress Notes (Signed)
   Subjective:    Patient ID: [name] [name], male    DOB: [birth year], 63 y.o.   MRN: [id number]  HPI This 63 y.o. male presents for evaluation of hospital follow up.  He has been having problems With fatigue and was in to be seen on 08/07/13.  He developed weakness, dizziness, and chest Tightness  In the office so he was sent to the ED where he had negative CE's and negative Nuclear stress test. He was dx with neurally mediated syncope or vasovagal syncope with long Prodrome.  He is seeing cardiology and is wearing a holter monitor or event monitor.  He Has had short acting insulin added to his diabetes regimen.  He is still c/o fatigue.   Review of Systems C/o fatigue No chest pain, SOB, HA, dizziness, vision change, N/V, diarrhea, constipation, dysuria, urinary urgency or frequency, myalgias, arthralgias or rash.     Objective:   Physical Exam Vital signs noted  Well developed well nourished male.  HEENT - Head atraumatic Normocephalic                Eyes - PERRLA, Conjuctiva - clear Sclera- Clear EOMI                Ears - EAC's Wnl TM's Wnl Gross Hearing WNL                Nose - Nares patent                 Throat - oropharanx wnl Respiratory - Lungs CTA bilateral Cardiac - RRR S1 and s2 w/o murmur     Assessment & Plan:  Diabetes His hgbaic was 10.5 and the hospital has started novolog 3 units AC.  He is not taking his fsbs's and I have asked him to start checking his fsbs's fasting at least.  He is advised to follow diabetic diet And to start exercising.  Follow up with Tammy Eckerd Pharm D in 2 weeks.  Vasovagal syncope - DX with this and this is probable cause of fatigue.    Fatigue - Discussed with patient his fatigue could be from uncontrolled diabetes  But is from neurally mediated or vasovagal syncope.  Recommended checking b12 and he wants to wait.  Follow up with cardiology and follow up in one month.    CAD - His CE's were negative and so was his stress test.   He is wearing event monitor/holter and He is scheduled for follow up.  [name] FNP

## 2013-08-29 ENCOUNTER — Ambulatory Visit: Payer: Medicare Other | Admitting: Family Medicine

## 2013-08-30 ENCOUNTER — Ambulatory Visit: Payer: Medicare Other | Admitting: Family Medicine

## 2013-09-04 ENCOUNTER — Encounter: Payer: Self-pay | Admitting: Pharmacist

## 2013-09-04 ENCOUNTER — Ambulatory Visit (INDEPENDENT_AMBULATORY_CARE_PROVIDER_SITE_OTHER): Payer: Medicare Other | Admitting: Pharmacist

## 2013-09-04 VITALS — BP 148/80 | HR 78 | Ht 74.0 in | Wt 256.0 lb

## 2013-09-04 DIAGNOSIS — E119 Type 2 diabetes mellitus without complications: Secondary | ICD-10-CM

## 2013-09-04 DIAGNOSIS — I10 Essential (primary) hypertension: Secondary | ICD-10-CM

## 2013-09-04 DIAGNOSIS — R55 Syncope and collapse: Secondary | ICD-10-CM

## 2013-09-04 NOTE — Progress Notes (Signed)
Diabetes Visit Chief Complaint:   Chief Complaint  Patient presents with  . Diabetes     Filed Vitals:   09/04/13 1216  BP: 148/80  Pulse: 78     HPI:  Patient is here to follow up uncontrolled type 2 DM.  Patient has recently hospitalized due to syncope.  Had cardiac W/U which did not reveal and acute problems.  He wore a heart monitor for a few days to a week.  He has not had follow up with cardio to review results of heart monitor.  While in hospital Invokana was discontinued because may have lead to dehydration and syncope.  His lisinopril was also decreased from 10mg  dialy to 2.5mg  daily because of hypotension possibly causing syncope. Patient denies any syncope since discharge from hospital in March.  Diabetes:  He has had type 2 Dm for several years.  Currently he is taking Januvia 100mg  1 tablet daily, Levemir 40 untis BID and Novolog 3 units prior to each meal.  Novolog was started in hospital.  Patient complains of stinging when he injects Levemir insulin and a large lump after injection.  Patient does have a few areas on abdomin of lipohypotrophy.  He also has hypertriglyceriemia - Which improved slightly from January 2015 until last checked 07/2013.  Mostly likely worsened by uncontrolled type 2 DM and diet.  He is currently taking pravachol 20mg  2 tablet daily, Fish Oil 1 capsule daily    Exam Edema:  negatvie  Polyuria:  postivie  Polydipsia:  positive Polyphagia:  negative  BMI:  Body mass index is 32.85 kg/(m^2).   Weight changes:  stable General Appearance:  alert, oriented, no acute distress and obese Mood/Affect:  normal   Low fat/carbohydrate diet?  No Nicotine Abuse?  Yes Medication Compliance?  Yes Exercise?  No Alcohol Abuse?  No  Home BG Monitoring:  Checking only occasionally (1-2 times per week) Average:  250 - 300     Lab Results  Component Value Date   HGBA1C 10.9* 08/07/2013  Previsous A1c was 12% (05/31/2013)   No results found for this  basename: MICROALBUR,  [id number]    Lab Results  Component Value Date   CHOL 215* 08/07/2013   HDL 22* 08/07/2013   LDLCALC UNABLE TO CALCULATE IF TRIGLYCERIDE OVER 400 mg/dL 08/07/2013   TRIG 475* 08/07/2013   CHOLHDL 9.8 08/07/2013    Previous Tg was 604 (05/2013)  Assessment: 1.  Diabetes.  Uncontrolled but slightly improved 2.  Blood Pressure.  Elevated today 3.  Lipids.  Uncontrolled - Tg improved but too high and unable to assess LDL 4.  Syncope - appears to be resolved but awaiting results from heart monitor / visit with Cardio   Recommendations: 1.  Medication recommendations at this time are as follows:     Change Levemir to TouJeo (new U-300 insulin) - inject 50 units BID.  Should see less stinging at injection site and possible better absorption. (Gave #2 samples)   Continue Januvia 100mg  dialy   Continue Novolog prior to each meal - recommended patient check prior to each meal and given 3 units if 80 to 250, if over 250 to given 5 units. 2.  Reviewed HBG goals:  Fasting 80-130 and 1-2 hour post prandial <180.  Patient is instructed to check BG 3 times per day.    3.  BP goal < 140/80 - will wait for follow up syncope with Dr [name] before increasing meds for BP 4.  LDL goal of <  100, HDL > 40 and TG < 150. 5.  Eye Exam yearly and Dental Exam every 6 months. 6.  Dietary recommendations:  Reviewed serving sizes.  Especially addressed patient's love of milkshakes (he is to limit serving size to small or kids size and no more than once weekly) 7.  Physical Activity recommendations:  Increase as able - just encouraged to move more 8.  Return to clinic in 2 wks   Time spent counseling patient:  60 minutes  Referring provider:  Roosvelt Harps, PharmD, CPP

## 2013-09-04 NOTE — Patient Instructions (Signed)
Stop Levemir (long acting insulin) Start Toujeo (long acting U-300 insulin) - inject 50 units twice a day Continue Novolog 3 units prior to meals.    Hypoglycemia (Low Blood Sugar) Hypoglycemia is when the glucose (sugar) in your blood is too low. Hypoglycemia can happen for many reasons. It can happen to people with or without diabetes. Hypoglycemia can develop quickly and can be a medical emergency.  CAUSES  Having hypoglycemia does not mean that you will develop diabetes. Different causes include:  Missed or delayed meals or not enough carbohydrates eaten.  Medication overdose. This could be by accident or deliberate. If by accident, your medication may need to be adjusted or changed.  Exercise or increased activity without adjustments in carbohydrates or medications.  A nerve disorder that affects body functions like your heart rate, blood pressure and digestion (autonomic neuropathy).  A condition where the stomach muscles do not function properly (gastroparesis). Therefore, medications may not absorb properly.  The inability to recognize the signs of hypoglycemia (hypoglycemic unawareness).  Absorption of insulin  may be altered.  Alcohol consumption.  Pregnancy/menstrual cycles/postpartum. This may be due to hormones.  Certain kinds of tumors. This is very rare. SYMPTOMS   Sweating.  Hunger.  Dizziness.  Blurred vision.  Drowsiness.  Weakness.  Headache.  Rapid heart beat.  Shakiness.  Nervousness. DIAGNOSIS  Diagnosis is made by monitoring blood glucose in one or all of the following ways:  Fingerstick blood glucose monitoring.  Laboratory results. TREATMENT  If you think your blood glucose is low:  Check your blood glucose, if possible. If it is less than 70 mg/dl, take one of the following:  3-4 glucose tablets.   cup juice (prefer clear like apple).   cup "regular" soda pop.  1 cup milk.  -1 tube of glucose gel.  5-6 hard  candies.  Do not over treat because your blood glucose (sugar) will only go too high.  Wait 15 minutes and recheck your blood glucose. If it is still less than 70 mg/dl (or below your target range), repeat treatment.  Eat a snack if it is more than one hour until your next meal. Sometimes, your blood glucose may go so low that you are unable to treat yourself. You may need someone to help you. You may even pass out or be unable to swallow. This may require you to get an injection of glucagon, which raises the blood glucose. HOME CARE INSTRUCTIONS  Check blood glucose as recommended by your caregiver.  Take medication as prescribed by your caregiver.  Follow your meal plan. Do not skip meals. Eat on time.  If you are going to drink alcohol, drink it only with meals.  Check your blood glucose before driving.  Check your blood glucose before and after exercise. If you exercise longer or different than usual, be sure to check blood glucose more frequently.  Always carry treatment with you. Glucose tablets are the easiest to carry.  Always wear medical alert jewelry or carry some form of identification that states that you have diabetes. This will alert people that you have diabetes. If you have hypoglycemia, they will have a better idea on what to do. SEEK MEDICAL CARE IF:   You are having problems keeping your blood sugar at target range.  You are having frequent episodes of hypoglycemia.  You feel you might be having side effects from your medicines.  You have symptoms of an illness that is not improving after 3-4 days.  You  notice a change in vision or a new problem with your vision. SEEK IMMEDIATE MEDICAL CARE IF:   You are a family member or friend of a person whose blood glucose goes below 70 mg/dl and is accompanied by:  Confusion.  A change in mental status.  The inability to swallow.  Passing out. Document Released: 05/04/2005 Document Revised: 07/27/2011 Document  Reviewed: 08/31/2011 [facility] Patient Information 2014 [place], [place].

## 2013-09-11 ENCOUNTER — Telehealth: Payer: Self-pay | Admitting: Family Medicine

## 2013-09-11 MED ORDER — INSULIN ASPART 100 UNIT/ML FLEXPEN: 3.0000 [IU] | PEN_INJECTOR | Freq: Three times a day (TID) | SUBCUTANEOUS | Status: DC

## 2013-09-11 NOTE — Telephone Encounter (Signed)
Patient states BG has improved greatly since starting Toujuo Needs Rx - rx called to [facility]. Toujou inject up to 50 units bid (or 100 units once a day) #30 ml = 90 day supply) 1 RF Novolog 3 units prior to each meal

## 2013-09-13 ENCOUNTER — Ambulatory Visit: Payer: 59 | Admitting: Cardiovascular Disease

## 2013-09-14 ENCOUNTER — Other Ambulatory Visit: Payer: Self-pay | Admitting: Pharmacist

## 2013-09-14 MED ORDER — INSULIN ASPART 100 UNIT/ML FLEXPEN: 7.0000 [IU] | PEN_INJECTOR | Freq: Three times a day (TID) | SUBCUTANEOUS | Status: DC

## 2013-09-18 ENCOUNTER — Other Ambulatory Visit: Payer: Self-pay | Admitting: Pharmacist

## 2013-09-21 ENCOUNTER — Ambulatory Visit: Payer: Self-pay

## 2013-10-10 ENCOUNTER — Telehealth: Payer: Self-pay | Admitting: Family Medicine

## 2013-10-10 NOTE — Telephone Encounter (Signed)
appt made

## 2013-10-11 ENCOUNTER — Encounter: Payer: Self-pay | Admitting: Physician Assistant

## 2013-10-11 ENCOUNTER — Ambulatory Visit (INDEPENDENT_AMBULATORY_CARE_PROVIDER_SITE_OTHER): Payer: Medicare Other | Admitting: Physician Assistant

## 2013-10-11 VITALS — BP 179/93 | HR 74 | Temp 98.0°F | Ht 74.0 in | Wt 259.0 lb

## 2013-10-11 DIAGNOSIS — S46819A Strain of other muscles, fascia and tendons at shoulder and upper arm level, unspecified arm, initial encounter: Secondary | ICD-10-CM

## 2013-10-11 DIAGNOSIS — S43499A Other sprain of unspecified shoulder joint, initial encounter: Secondary | ICD-10-CM

## 2013-10-11 DIAGNOSIS — M25519 Pain in unspecified shoulder: Secondary | ICD-10-CM

## 2013-10-11 MED ORDER — OMEPRAZOLE 20 MG PO CPDR: 20.0000 mg | DELAYED_RELEASE_CAPSULE | Freq: Every day | ORAL | Status: DC

## 2013-10-11 MED ORDER — CYCLOBENZAPRINE HCL 10 MG PO TABS: 10.0000 mg | ORAL_TABLET | Freq: Three times a day (TID) | ORAL | Status: DC | PRN

## 2013-10-11 NOTE — Patient Instructions (Signed)
Cervical Sprain A cervical sprain is an injury in the neck in which the strong, fibrous tissues (ligaments) that connect your neck bones stretch or tear. Cervical sprains can range from mild to severe. Severe cervical sprains can cause the neck vertebrae to be unstable. This can lead to damage of the spinal cord and can result in serious nervous system problems. The amount of time it takes for a cervical sprain to get better depends on the cause and extent of the injury. Most cervical sprains heal in 1 to 3 weeks. CAUSES  Severe cervical sprains may be caused by:   Contact sport injuries (such as from football, rugby, wrestling, hockey, auto racing, gymnastics, diving, martial arts, or boxing).   Motor vehicle collisions.   Whiplash injuries. This is an injury from a sudden forward-and backward whipping movement of the head and neck.  Falls.  Mild cervical sprains may be caused by:   Being in an awkward position, such as while cradling a telephone between your ear and shoulder.   Sitting in a chair that does not offer proper support.   Working at a poorly designed computer station.   Looking up or down for long periods of time.  SYMPTOMS   Pain, soreness, stiffness, or a burning sensation in the front, back, or sides of the neck. This discomfort may develop immediately after the injury or slowly, 24 hours or more after the injury.   Pain or tenderness directly in the middle of the back of the neck.   Shoulder or upper back pain.   Limited ability to move the neck.   Headache.   Dizziness.   Weakness, numbness, or tingling in the hands or arms.   Muscle spasms.   Difficulty swallowing or chewing.   Tenderness and swelling of the neck.  DIAGNOSIS  Most of the time your health care provider can diagnose a cervical sprain by taking your history and doing a physical exam. Your health care provider will ask about previous neck injuries and any known neck  problems, such as arthritis in the neck. X-rays may be taken to find out if there are any other problems, such as with the bones of the neck. Other tests, such as a CT scan or MRI, may also be needed.  TREATMENT  Treatment depends on the severity of the cervical sprain. Mild sprains can be treated with rest, keeping the neck in place (immobilization), and pain medicines. Severe cervical sprains are immediately immobilized. Further treatment is done to help with pain, muscle spasms, and other symptoms and may include:  Medicines, such as pain relievers, numbing medicines, or muscle relaxants.   Physical therapy. This may involve stretching exercises, strengthening exercises, and posture training. Exercises and improved posture can help stabilize the neck, strengthen muscles, and help stop symptoms from returning.  HOME CARE INSTRUCTIONS   Put ice on the injured area.   Put ice in a plastic bag.   Place a towel between your skin and the bag.   Leave the ice on for 15 20 minutes, 3 4 times a day.   If your injury was severe, you may have been given a cervical collar to wear. A cervical collar is a two-piece collar designed to keep your neck from moving while it heals.  Do not remove the collar unless instructed by your health care provider.  If you have long hair, keep it outside of the collar.  Ask your health care provider before making any adjustments to your collar.   Minor adjustments may be required over time to improve comfort and reduce pressure on your chin or on the back of your head.  Ifyou are allowed to remove the collar for cleaning or bathing, follow your health care provider's instructions on how to do so safely.  Keep your collar clean by wiping it with mild soap and water and drying it completely. If the collar you have been given includes removable pads, remove them every 1 2 days and hand wash them with soap and water. Allow them to air dry. They should be completely  dry before you wear them in the collar.  If you are allowed to remove the collar for cleaning and bathing, wash and dry the skin of your neck. Check your skin for irritation or sores. If you see any, tell your health care provider.  Do not drive while wearing the collar.   Only take over-the-counter or prescription medicines for pain, discomfort, or fever as directed by your health care provider.   Keep all follow-up appointments as directed by your health care provider.   Keep all physical therapy appointments as directed by your health care provider.   Make any needed adjustments to your workstation to promote good posture.   Avoid positions and activities that make your symptoms worse.   Warm up and stretch before being active to help prevent problems.  SEEK MEDICAL CARE IF:   Your pain is not controlled with medicine.   You are unable to decrease your pain medicine over time as planned.   Your activity level is not improving as expected.  SEEK IMMEDIATE MEDICAL CARE IF:   You develop any bleeding.  You develop stomach upset.  You have signs of an allergic reaction to your medicine.   Your symptoms get worse.   You develop new, unexplained symptoms.   You have numbness, tingling, weakness, or paralysis in any part of your body.  MAKE SURE YOU:   Understand these instructions.  Will watch your condition.  Will get help right away if you are not doing well or get worse. Document Released: 03/01/2007 Document Revised: 02/22/2013 Document Reviewed: 11/09/2012 [employer] Patient Information 2014 [employer].  

## 2013-10-11 NOTE — Progress Notes (Signed)
Subjective:     Patient ID: [name] [name], male   DOB: 1951/02/13, 63 y.o.   MRN: [id number]  HPI Pt with L sided post neck/shoulder pain Pain started several weeks ago after running a tiller Pt went to his chiropractor and sx resolved He then went back and used the tiller again Sx returned and this time massage and the Chiropractor have been unable to get sx to resolve Pain will run up the back of the neck and give pt headaches  Review of Systems No sx to the R side Some radiation of sx down the lateral shoulder No numbness or weakness noted No locking of the shoulder Certain positions make sx worse    Objective:   Physical Exam NAD FROM of the C-spine Shoulder shrug is equal FROM of the L shoulder Some increase in sx with rest. abduction Good strength distal Good pulses/sensory + TTP just medial and superior of the L scapula + muscle spasm Offered trigger point injection 1cc Marcaine/1cc Kenalog given under sterile technique     Assessment:     Trap strain     Plan:     Heat/Ice Massage Gentle stretching Flexeril 10mg  1 po tid #30- SE reviewed F/U prn

## 2013-10-24 ENCOUNTER — Telehealth: Payer: Self-pay | Admitting: Family Medicine

## 2013-10-24 NOTE — Telephone Encounter (Signed)
Spoke with pt regarding [name] [name]'s recommendation to have MRI Pt wants to have MRI at [facility]

## 2013-10-24 NOTE — Telephone Encounter (Signed)
Patient aware that we have fax wants a call back after bill has reviewed it to see what he thinks.

## 2013-10-25 ENCOUNTER — Ambulatory Visit (INDEPENDENT_AMBULATORY_CARE_PROVIDER_SITE_OTHER): Payer: Medicare Other | Admitting: Family Medicine

## 2013-10-25 ENCOUNTER — Ambulatory Visit (INDEPENDENT_AMBULATORY_CARE_PROVIDER_SITE_OTHER): Payer: Medicare Other

## 2013-10-25 ENCOUNTER — Telehealth: Payer: Self-pay

## 2013-10-25 ENCOUNTER — Encounter: Payer: Self-pay | Admitting: Family Medicine

## 2013-10-25 ENCOUNTER — Telehealth: Payer: Self-pay | Admitting: Physician Assistant

## 2013-10-25 VITALS — BP 190/95 | HR 62 | Temp 96.8°F | Wt 254.0 lb

## 2013-10-25 DIAGNOSIS — M542 Cervicalgia: Secondary | ICD-10-CM

## 2013-10-25 DIAGNOSIS — M5412 Radiculopathy, cervical region: Secondary | ICD-10-CM

## 2013-10-25 MED ORDER — OXYCODONE-ACETAMINOPHEN 5-325 MG PO TABS: 1.0000 | ORAL_TABLET | Freq: Three times a day (TID) | ORAL | Status: DC | PRN

## 2013-10-25 MED ORDER — PREDNISONE 10 MG PO TABS: ORAL_TABLET | ORAL | Status: DC

## 2013-10-25 NOTE — Telephone Encounter (Signed)
Patient said that his chiropractor Dr Bethann Goo said to get an MRI  At [facility]

## 2013-10-25 NOTE — Telephone Encounter (Signed)
Wants to be seen ASAP, needs pain med and aware that [facility] will not be back till Friday.

## 2013-10-25 NOTE — Telephone Encounter (Signed)
Appt given for today at 5 but told to come at 4:30 in case we need xray

## 2013-10-25 NOTE — Progress Notes (Signed)
   Subjective:    Patient ID: [name] [name], male    DOB: 1950-07-09, 63 y.o.   MRN: [id number]  HPI This 63 y.o. male presents for evaluation of left shoulder pain.  He has been seeing chiropractor and he has forwarded a letter stating that [name] [name] needs MRI, pain meds, and referral to neurosurgery. He is c/o pain radiating from neck down left shoulder to left hand.  He states his pain level is severe. He has hx of DDD of the LS spine and cervical spine.  He has been seen by neurosurgery in past but wants referral to see Dr. [name].   Review of Systems C/o cervical spine tenderness and cervical radicular sx's No chest pain, SOB, HA, dizziness, vision change, N/V, diarrhea, constipation, dysuria, urinary urgency or frequency, myalgias, arthralgias or rash.     Objective:   Physical Exam  Vital signs noted  Well developed well nourished male.  HEENT - Head atraumatic Normocephalic Respiratory - Lungs CTA bilateral Cardiac - RRR S1 and S2 without murmur GI - Abdomen soft Nontender and bowel sounds active x 4 Extremities - No edema. Neuro - Grossly intact. Equal strength bilateral upper extremities and equal grips MS - TTP left cervical paraspinous muscles and trapezius  Xray cervical spine - No fracture Prelimnary reading by Iverson Alamin    Assessment & Plan:  Neck pain - Plan: DG Cervical Spine Complete, MR Cervical Spine Wo Contrast, predniSONE (DELTASONE) 10 MG tablet, Ambulatory referral to Neurosurgery, oxyCODONE-acetaminophen (ROXICET) 5-325 MG per tablet, DISCONTINUED: oxyCODONE-acetaminophen (ROXICET) 5-325 MG per tablet, DISCONTINUED: oxyCODONE-acetaminophen (ROXICET) 5-325 MG per tablet  Cervical radiculopathy - Plan: MR Cervical Spine Wo Contrast, predniSONE (DELTASONE) 10 MG tablet, Ambulatory referral to Neurosurgery, oxyCODONE-acetaminophen (ROXICET) 5-325 MG per tablet, DISCONTINUED: oxyCODONE-acetaminophen (ROXICET) 5-325 MG per tablet, DISCONTINUED:  oxyCODONE-acetaminophen (ROXICET) 5-325 MG per tablet   [name] FNP

## 2013-10-29 ENCOUNTER — Ambulatory Visit
Admission: RE | Admit: 2013-10-29 | Discharge: 2013-10-29 | Disposition: A | Discharge status: Home / Self Care | Payer: Medicare Other | Source: Ambulatory Visit | Attending: Family Medicine | Admitting: Family Medicine

## 2013-10-29 DIAGNOSIS — M5412 Radiculopathy, cervical region: Secondary | ICD-10-CM

## 2013-10-29 DIAGNOSIS — M542 Cervicalgia: Secondary | ICD-10-CM

## 2013-10-31 ENCOUNTER — Other Ambulatory Visit: Payer: Medicare Other

## 2013-10-31 ENCOUNTER — Telehealth: Payer: Self-pay | Admitting: Family Medicine

## 2013-10-31 ENCOUNTER — Other Ambulatory Visit: Payer: Self-pay | Admitting: *Deleted

## 2013-10-31 ENCOUNTER — Other Ambulatory Visit: Payer: Self-pay | Admitting: Family Medicine

## 2013-10-31 DIAGNOSIS — M4802 Spinal stenosis, cervical region: Secondary | ICD-10-CM

## 2013-10-31 NOTE — Telephone Encounter (Signed)
Spoke with pt regarding MRI Will get appt for neurosurgeon ASAP Also will fax to Dr Sandi Carne chiropractor

## 2013-11-01 ENCOUNTER — Telehealth: Payer: Self-pay | Admitting: Family Medicine

## 2013-11-07 ENCOUNTER — Telehealth: Payer: Self-pay | Admitting: Family Medicine

## 2013-11-07 ENCOUNTER — Other Ambulatory Visit: Payer: Self-pay | Admitting: Family Medicine

## 2013-11-07 DIAGNOSIS — M542 Cervicalgia: Secondary | ICD-10-CM

## 2013-11-07 DIAGNOSIS — M5412 Radiculopathy, cervical region: Secondary | ICD-10-CM

## 2013-11-07 MED ORDER — OXYCODONE-ACETAMINOPHEN 5-325 MG PO TABS: 1.0000 | ORAL_TABLET | Freq: Three times a day (TID) | ORAL | Status: DC | PRN

## 2013-11-07 NOTE — Telephone Encounter (Signed)
Requesting oxycodone. Last filled at office visit on 10/25/13.

## 2013-11-07 NOTE — Telephone Encounter (Signed)
Come in and pick up rx for pain

## 2013-11-08 MED ORDER — OXYCODONE-ACETAMINOPHEN 5-325 MG PO TABS: 1.0000 | ORAL_TABLET | Freq: Three times a day (TID) | ORAL | Status: DC | PRN

## 2013-11-08 NOTE — Telephone Encounter (Signed)
Left message, script for Roxicet 5-25mg , qty 60, take one to two every 8 hours as needed,  At front for patient.

## 2013-11-24 ENCOUNTER — Other Ambulatory Visit: Payer: Self-pay | Admitting: *Deleted

## 2013-11-24 ENCOUNTER — Telehealth: Payer: Self-pay | Admitting: Family Medicine

## 2013-11-24 MED ORDER — GABAPENTIN 300 MG PO CAPS: 300.0000 mg | ORAL_CAPSULE | Freq: Every day | ORAL | Status: DC

## 2013-11-25 ENCOUNTER — Other Ambulatory Visit: Payer: Self-pay | Admitting: Nurse Practitioner

## 2013-11-25 MED ORDER — GABAPENTIN 300 MG PO CAPS: 300.0000 mg | ORAL_CAPSULE | Freq: Every day | ORAL | Status: DC

## 2013-11-27 ENCOUNTER — Telehealth: Payer: Self-pay | Admitting: Family Medicine

## 2013-11-28 ENCOUNTER — Other Ambulatory Visit: Payer: Self-pay | Admitting: Family Medicine

## 2013-11-28 NOTE — Telephone Encounter (Signed)
Did he see Dr. [name] and get surgery?  He was supposed to be getting in with neurosurgery for his neck pain due to DDD cervical spine.  If he has been rx'd any narcotic pain meds by Dr. [name] then he needs to see Dr. [name] for pain meds

## 2013-11-28 NOTE — Telephone Encounter (Signed)
Patient was seen by Dr [name] on 11/14/13. Advised that he will need to contact them regarding pain medication.  Patient agreed to contact them.

## 2013-12-01 ENCOUNTER — Other Ambulatory Visit: Payer: Self-pay | Admitting: *Deleted

## 2013-12-01 ENCOUNTER — Ambulatory Visit (INDEPENDENT_AMBULATORY_CARE_PROVIDER_SITE_OTHER): Payer: Medicare Other | Admitting: Family Medicine

## 2013-12-01 ENCOUNTER — Encounter: Payer: Self-pay | Admitting: Family Medicine

## 2013-12-01 VITALS — BP 150/89 | HR 68 | Temp 97.4°F | Ht 74.0 in | Wt 257.0 lb

## 2013-12-01 DIAGNOSIS — E119 Type 2 diabetes mellitus without complications: Secondary | ICD-10-CM

## 2013-12-01 LAB — GLUCOSE, POCT (MANUAL RESULT ENTRY): POC Glucose: 310 mg/dl — AB (ref 70–99)

## 2013-12-01 LAB — POCT GLYCOSYLATED HEMOGLOBIN (HGB A1C): Hemoglobin A1C: 12.4

## 2013-12-01 MED ORDER — GABAPENTIN 300 MG PO CAPS: 300.0000 mg | ORAL_CAPSULE | Freq: Three times a day (TID) | ORAL | Status: DC

## 2013-12-01 MED ORDER — INSULIN LISPRO 100 UNIT/ML ~~LOC~~ SOLN: 3.0000 [IU] | Freq: Three times a day (TID) | SUBCUTANEOUS | Status: DC

## 2013-12-01 NOTE — Progress Notes (Signed)
   Subjective:    Patient ID: [name] [name], male    DOB: January 25, 1951, 63 y.o.   MRN: [id number]  HPI Routine follow-up.  With wife who reports fluctuations in pressure and glucose.  Had injection in C spine yesterday.  Pain covered with gabapentin and oxycodone    Review of Systems  Constitutional: Negative.   HENT: Negative.   Eyes: Negative.   Respiratory: Negative.   Cardiovascular: Negative.   Gastrointestinal: Negative.   Endocrine: Negative.   Genitourinary: Negative.   Musculoskeletal: Positive for neck pain and neck stiffness.  Skin: Negative.   Psychiatric/Behavioral: Negative.        Objective:   Physical Exam  Constitutional: He is oriented to person, place, and time.  HENT:  Head: Normocephalic.  Eyes: Pupils are equal, round, and reactive to light.  Neck: Normal range of motion.  Abdominal: Soft.  Neurological: He is alert and oriented to person, place, and time.  Skin: Skin is warm.          Assessment & Plan:  The encounter diagnosis was Type 2 diabetes mellitus without complication. Will check usual labs, A1C and BMET  Hypertension: even though it seems labile, will continue with same since average pressure is less than 140/90 per pt and wife

## 2013-12-01 NOTE — Telephone Encounter (Signed)
Not a clear cut change from his current regimen of Toujeo 50 untis bid + Novolog 3 units prior to meals to Relion 70/30 but I would recommend dose of 50 units BID of Relion.  Patient needs to check BG at least 3 times a day and monitor for increase chance of hypoglycemia (especially around lunch).  Call if has BG greater than 250 or less than 70 more than once per week.

## 2013-12-01 NOTE — Telephone Encounter (Signed)
Novolog changed to Humalog for cost. It is considerably cheaper but still $102 monthly. He is also using [employer].  In the past he has used Relion 70/30 through [facility] and they can provide it for $20 monthly. He would like to change back to this.  Discussed with Dr. Sabra Heck and will forward to Cherre Robins, Pharm-D for consultation. If we discontinue Humalog and prescribe Relion 70/30 should we discontinue Toujeo as well? What are your thoughts on dosing?

## 2013-12-02 LAB — BMP8+EGFR
BUN/Creatinine Ratio: 23 — ABNORMAL HIGH (ref 10–22)
BUN: 22 mg/dL (ref 8–27)
CO2: 22 mmol/L (ref 18–29)
Calcium: 10.5 mg/dL — ABNORMAL HIGH (ref 8.6–10.2)
Chloride: 93 mmol/L — ABNORMAL LOW (ref 97–108)
Creatinine, Ser: 0.95 mg/dL (ref 0.76–1.27)
GFR calc Af Amer: 98 mL/min/{1.73_m2} (ref >59)
GFR calc non Af Amer: 85 mL/min/{1.73_m2} (ref >59)
Glucose: 339 mg/dL — ABNORMAL HIGH (ref 65–99)
Potassium: 5 mmol/L (ref 3.5–5.2)
Sodium: 132 mmol/L — ABNORMAL LOW (ref 134–144)

## 2013-12-04 MED ORDER — INSULIN NPH ISOPHANE & REGULAR (70-30) 100 UNIT/ML ~~LOC~~ SUSP: 50.0000 [IU] | Freq: Two times a day (BID) | SUBCUTANEOUS | Status: DC

## 2013-12-04 NOTE — Telephone Encounter (Signed)
We should follow [name] [name]'s recommendation

## 2013-12-04 NOTE — Telephone Encounter (Signed)
Tammy has made recommendation and I think we should follow her lead

## 2013-12-06 NOTE — Telephone Encounter (Signed)
Patient wants to know what we are replacing the  novalog  With and how much?

## 2013-12-07 NOTE — Telephone Encounter (Signed)
The Novolog 70/30 Mix is 70% long acting which replaces Toujeo and 30% Novolog which is short acting and replaces Novolog

## 2013-12-07 NOTE — Telephone Encounter (Signed)
Patient picked up medication. He will discuss with Tammy at appt on 12/11/13. He has enough toujeo and Novolog to last until then.

## 2013-12-11 ENCOUNTER — Encounter: Payer: Self-pay | Admitting: Pharmacist

## 2013-12-11 ENCOUNTER — Ambulatory Visit (INDEPENDENT_AMBULATORY_CARE_PROVIDER_SITE_OTHER): Payer: Medicare Other | Admitting: Pharmacist

## 2013-12-11 VITALS — BP 136/84 | HR 64 | Ht 74.0 in | Wt 256.0 lb

## 2013-12-11 DIAGNOSIS — E118 Type 2 diabetes mellitus with unspecified complications: Secondary | ICD-10-CM

## 2013-12-11 DIAGNOSIS — E669 Obesity, unspecified: Secondary | ICD-10-CM

## 2013-12-11 DIAGNOSIS — I10 Essential (primary) hypertension: Secondary | ICD-10-CM

## 2013-12-11 DIAGNOSIS — R635 Abnormal weight gain: Secondary | ICD-10-CM

## 2013-12-11 DIAGNOSIS — E785 Hyperlipidemia, unspecified: Secondary | ICD-10-CM

## 2013-12-11 NOTE — Patient Instructions (Signed)
Increase Toujeo to 55 units twice a day for 1 week  If your blood glucose is still over 200 then increase to 60 units twice a day.    Increase Novolog to 10 units with each meal

## 2013-12-11 NOTE — Progress Notes (Signed)
Diabetes Visit Chief Complaint:   Chief Complaint  Patient presents with  . Diabetes     There were no vitals filed for this visit.   HPI:  Patient is here to follow up uncontrolled type 2 DM.   He has had type 2 Dm for several years.  He admits to being non compliant with diet recommendations Currently he is taking Januvia 100mg  1 tablet daily, Toujeo 50 untis BID and Novolog 3 units prior to each meal.    In May and 11/30/13 he received steroid injections into joint and back.  He also had a prednisone dose pack 10/25/13 which is contributory to his controlled BG as well as decreased activity due to pain / ruptured disc.  He is seeing Dr Glenna Fellows - neurosurgeon and Dr Francesco Runner - pain management   He also has hypertriglyceriemia - Which improved slightly from January 2015 until last checked 07/2013.  Mostly likely worsened by uncontrolled type 2 DM and diet.  He is currently taking pravachol 20mg  2 tablet daily    Exam Edema:  negatvie  Polyuria:  postivie  Polydipsia:  positive Polyphagia:  negative  BMI:  There is no weight on file to calculate BMI.   Weight changes:  stable General Appearance:  alert, oriented, no acute distress and obese Mood/Affect:  normal   Low fat/carbohydrate diet?  No  Eats lots of breads, biscuits, sweets - ice cream, little Debbie oatmeal cookies Nicotine Abuse?  Yes Medication Compliance?  Yes Exercise?  No Alcohol Abuse?  No  Home BG Monitoring:  Checking only occasionally (about 5 times per week) Average:  High 200's    Lab Results  Component Value Date   HGBA1C 12.4 12/01/2013    No results found for this basename: MICROALBUR,  [id number]    Lab Results  Component Value Date   CHOL 215* 08/07/2013   HDL 22* 08/07/2013   LDLCALC UNABLE TO CALCULATE IF TRIGLYCERIDE OVER 400 mg/dL 08/07/2013   TRIG 475* 08/07/2013   CHOLHDL 9.8 08/07/2013    Previous Tg was 475 (08/07/2013)  Assessment: 1.  Diabetes.  Uncontrolled  2.  Blood Pressure.  At goal  today 3.  Lipids.  Uncontrolled - Tg improved but too high and unable to assess LDL 4.  Obesity - dietary non compliance   Recommendations: 1.  Medication recommendations at this time are as follows:     Finish TouJeo (new U-300 insulin) - inject 50 units BID and Novolog 10 unit prior to each meal - once finished with these will change to Relion 70/30 50 units BID due to cost of medciations   Continue Januvia 100mg  dialy    2.  Reviewed HBG goals:  Fasting 80-130 and 1-2 hour post prandial <180.  Patient is instructed to check BG 3 times per day.    3.  BP goal < 140/80 - continue metoproolol and lisinopril at current dose 4.  LDL goal of < 100, HDL > 40 and TG < 150 - Better BG control needed to decrease Tg 5.  Eye Exam yearly and Dental Exam every 6 months. 6.  Dietary recommendations:  Reviewed serving sizes.  Especially addressed alternatives to current intake of sweets - sugar free jello and pudding, reduced serving sizes and change to low CHO options.   Increase non-starchy vegetables - carrots, green bean, squash, zucchini, tomatoes, onions, peppers, spinach and other green leafy vegetables, cabbage, lettuce, cucumbers, asparagus, okra (not fried), eggplant  limit sugar and processed foods (cakes, cookies,  ice cream, crackers and chips)  Increase fresh fruit but limit serving sizes 1/2 cup or about the size of tennis or baseball  limit red meat to now more than 1-2 times per week (serving size about the size of your palm)  Choose whole grains / leans proteins - whole wheat bread, quinoa, whole grain rice (1/2 cup), fish, chicken, [place]  7.  Physical Activity recommendations:  Will not recommend until patient has appt with neurosurgeon and only if OK which at current time I do not think exercise if recommended. 8.  Return to clinic in 4 wks   Time spent counseling patient:  60 minutes    Cherre Robins, PharmD, CPP, CDE

## 2013-12-14 ENCOUNTER — Telehealth: Payer: Self-pay | Admitting: Pharmacist

## 2013-12-15 MED ORDER — LISINOPRIL 2.5 MG PO TABS: 2.5000 mg | ORAL_TABLET | Freq: Every day | ORAL | Status: DC

## 2013-12-15 NOTE — Telephone Encounter (Signed)
done

## 2013-12-20 ENCOUNTER — Encounter: Payer: Self-pay | Admitting: Family Medicine

## 2013-12-20 ENCOUNTER — Ambulatory Visit (INDEPENDENT_AMBULATORY_CARE_PROVIDER_SITE_OTHER): Payer: Medicare Other | Admitting: Family Medicine

## 2013-12-20 VITALS — BP 98/60 | HR 82 | Temp 97.9°F | Wt 255.0 lb

## 2013-12-20 DIAGNOSIS — R1032 Left lower quadrant pain: Secondary | ICD-10-CM

## 2013-12-20 DIAGNOSIS — I951 Orthostatic hypotension: Secondary | ICD-10-CM

## 2013-12-20 DIAGNOSIS — K5900 Constipation, unspecified: Secondary | ICD-10-CM

## 2013-12-20 DIAGNOSIS — R079 Chest pain, unspecified: Secondary | ICD-10-CM

## 2013-12-20 DIAGNOSIS — R7309 Other abnormal glucose: Secondary | ICD-10-CM

## 2013-12-20 DIAGNOSIS — R5381 Other malaise: Secondary | ICD-10-CM

## 2013-12-20 DIAGNOSIS — R5383 Other fatigue: Secondary | ICD-10-CM

## 2013-12-20 DIAGNOSIS — D72829 Elevated white blood cell count, unspecified: Secondary | ICD-10-CM

## 2013-12-20 DIAGNOSIS — M5030 Other cervical disc degeneration, unspecified cervical region: Secondary | ICD-10-CM

## 2013-12-20 DIAGNOSIS — E860 Dehydration: Secondary | ICD-10-CM

## 2013-12-20 LAB — GLUCOSE, POCT (MANUAL RESULT ENTRY)
POC Glucose: 270 mg/dl — AB (ref 70–99)
POC Glucose: 281 mg/dl — AB (ref 70–99)

## 2013-12-20 LAB — POCT UA - MICROSCOPIC ONLY
Bacteria, U Microscopic: NEGATIVE
Casts, Ur, LPF, POC: NEGATIVE
Crystals, Ur, HPF, POC: NEGATIVE
Epithelial cells, urine per micros: NEGATIVE
Mucus, UA: NEGATIVE

## 2013-12-20 LAB — POCT URINALYSIS DIPSTICK
Bilirubin, UA: NEGATIVE
Blood, UA: NEGATIVE
Glucose, UA: 1000
Ketones, UA: NEGATIVE
Leukocytes, UA: NEGATIVE
Nitrite, UA: NEGATIVE
Protein, UA: NEGATIVE
Spec Grav, UA: 1.025
Urobilinogen, UA: NEGATIVE
pH, UA: 7

## 2013-12-20 LAB — POCT GLYCOSYLATED HEMOGLOBIN (HGB A1C): Hemoglobin A1C: 12.3

## 2013-12-20 MED ORDER — CIPROFLOXACIN HCL 500 MG PO TABS: 500.0000 mg | ORAL_TABLET | Freq: Two times a day (BID) | ORAL | Status: DC

## 2013-12-20 NOTE — Progress Notes (Signed)
Subjective:    Patient ID: [name] [name], male    DOB: 24-Jul-1950, 63 y.o.   MRN: [id number]  HPI This 63 y.o. male presents for evaluation of weakness and fatigue.  He is having low bp and last evening he had some indigestion.  He denies chest pain at present.  He has been having elevated blood sugars and yesterday his glucose was in the 300's and he states it is better today.  He Has been seeing Dr. Lyla Son for chronic neck pain.  He has a pinched nerve in his neck.  He c/o lower abdominal pain.  He c/o constipation.   He c/o indigestion last hs and he c/o polydipsia and polyuria. He c/o hypotension.  His wife called me last night stating he was having bp in the 78'M and 76'H systolic.  He was advised to come in as a walk in appointment.   Review of Systems C/o constipation, dehydration, hyperglycemia, polyuria, polydipsia,abdominal pain,  No chest pain, SOB, HA, dizziness, vision change, N/V, diarrhea, myalgias, arthralgias or rash.     Objective:   Physical Exam   Vital signs noted  Acutely ill appearing male in NAD  HEENT - Head atraumatic Normocephalic                Eyes - PERRLA, Conjuctiva - clear Sclera- Clear EOMI                Ears - EAC's Wnl TM's Wnl Gross Hearing WNL                Throat - oropharanx dry Respiratory - Lungs CTA bilateral Cardiac - RRR S1 and S2 without murmur. bp standing 110/70 GI - Abdomen soft tender LLQ and bowel sounds active x 4 Extremities - No edema. Neuro - Grossly intact.  Results for orders placed in visit on 12/20/13  GLUCOSE, POCT (MANUAL RESULT ENTRY)      Result Value Ref Range   POC Glucose 281 (*) 70 - 99 mg/dl  POCT GLYCOSYLATED HEMOGLOBIN (HGB A1C)      Result Value Ref Range   Hemoglobin A1C 12.3%    GLUCOSE, POCT (MANUAL RESULT ENTRY)      Result Value Ref Range   POC Glucose 270 (*) 70 - 99 mg/dl  POCT UA - MICROSCOPIC ONLY      Result Value Ref Range   WBC, Ur, HPF, POC occ     RBC, urine, microscopic occ     Bacteria, U Microscopic neg     Mucus, UA neg     Epithelial cells, urine per micros neg     Crystals, Ur, HPF, POC neg     Casts, Ur, LPF, POC neg    POCT URINALYSIS DIPSTICK      Result Value Ref Range   Color, UA yellow     Clarity, UA clear     Glucose, UA 1000     Bilirubin, UA neg     Ketones, UA neg     Spec Grav, UA 1.025     Blood, UA neg     pH, UA 7.0     Protein, UA neg     Urobilinogen, UA negative     Nitrite, UA neg     Leukocytes, UA Negative       EKG - NSR with early repolarization in inferior leads unchanged from prior EKG [name] FNP Assessment & Plan:  Other abnormal glucose - Plan: POCT glucose (manual entry), POCT  glycosylated hemoglobin (Hb A1C), POCT glucose (manual entry), POCT UA - Microscopic Only, POCT urinalysis dipstick, CMP14+EGFR, TSH, Vitamin B12, Vit D  25 hydroxy (rtn osteoporosis monitoring), CBC With differential/Platelet, CANCELED: POCT CBC.  Novolog Pen SSI  Diabetes - He is not using any rapid acting insulin.  He is using intermediate insulin 55 units bid. He is give samples of novolog pen #2.  He is advised to use SSI AC at least tid FSBS-150/15 = units of insulin to use sq.  He cannot afford the recent short acting insulin.  Follow up with clinical pharmacist.  Chest pain, unspecified - Plan: EKG 12-Lead, POCT UA - Microscopic Only, POCT urinalysis dipstick, CMP14+EGFR, TSH, Vitamin B12, Vit D  25 hydroxy (rtn osteoporosis monitoring), CBC With differential/Platelet, CANCELED: POCT CBC.  His EKG was no changes and he currently is pain free. He had indigestion last HS.  Continue PPI  Other malaise and fatigue - Plan: POCT UA - Microscopic Only, POCT urinalysis dipstick, CMP14+EGFR, TSH, Vitamin B12, Vit D  25 hydroxy (rtn osteoporosis monitoring), CBC With differential/Platelet, CANCELED: POCT CBC  Orthostasis - Plan: POCT UA - Microscopic Only, POCT urinalysis dipstick, CMP14+EGFR, TSH, Vitamin B12, Vit D  25 hydroxy (rtn  osteoporosis monitoring), CBC With differential/Platelet, CANCELED: POCT CBC  Dehydration - One and half liters of NS IV over 2 hours.  DDD (degenerative disc disease), cervical - follow up with Dr. [name] neurosurgeon and Dr. Lyla Son chronic pain.  Constipation - Linzess 135m one po qd #14 samples.  Discussed with patient that the chronic narcotic therapy is causing this.  Abdominal pain - cipro 5085mone po bid x 10 days #20  Spent over one hour with patient  Follow up prn  WiLysbeth PennerNP

## 2013-12-21 LAB — CBC WITH DIFFERENTIAL
Basophils Absolute: 0.1 10*3/uL (ref 0.0–0.2)
Basos: 0 %
Eos: 1 %
Eosinophils Absolute: 0.1 10*3/uL (ref 0.0–0.4)
HCT: 43.9 % (ref 37.5–51.0)
Hemoglobin: 15.3 g/dL (ref 12.6–17.7)
Immature Grans (Abs): 0.2 10*3/uL — ABNORMAL HIGH (ref 0.0–0.1)
Immature Granulocytes: 1 %
Lymphocytes Absolute: 2.3 10*3/uL (ref 0.7–3.1)
Lymphs: 21 %
MCH: 29.8 pg (ref 26.6–33.0)
MCHC: 34.9 g/dL (ref 31.5–35.7)
MCV: 86 fL (ref 79–97)
Monocytes Absolute: 0.8 10*3/uL (ref 0.1–0.9)
Monocytes: 7 %
Neutrophils Absolute: 7.9 10*3/uL — ABNORMAL HIGH (ref 1.4–7.0)
Neutrophils Relative %: 70 %
Platelets: 246 10*3/uL (ref 150–379)
RBC: 5.13 x10E6/uL (ref 4.14–5.80)
RDW: 13.7 % (ref 12.3–15.4)
WBC: 11.1 10*3/uL — ABNORMAL HIGH (ref 3.4–10.8)

## 2013-12-21 LAB — CMP14+EGFR
ALT: 22 IU/L (ref 0–44)
AST: 16 IU/L (ref 0–40)
Albumin/Globulin Ratio: 1.8 (ref 1.1–2.5)
Albumin: 4.2 g/dL (ref 3.6–4.8)
Alkaline Phosphatase: 69 IU/L (ref 39–117)
BUN/Creatinine Ratio: 27 — ABNORMAL HIGH (ref 10–22)
BUN: 32 mg/dL — ABNORMAL HIGH (ref 8–27)
CO2: 20 mmol/L (ref 18–29)
Calcium: 9.7 mg/dL (ref 8.6–10.2)
Chloride: 96 mmol/L — ABNORMAL LOW (ref 97–108)
Creatinine, Ser: 1.2 mg/dL (ref 0.76–1.27)
GFR calc Af Amer: 74 mL/min/{1.73_m2} (ref >59)
GFR calc non Af Amer: 64 mL/min/{1.73_m2} (ref >59)
Globulin, Total: 2.3 g/dL (ref 1.5–4.5)
Glucose: 265 mg/dL — ABNORMAL HIGH (ref 65–99)
Potassium: 5.1 mmol/L (ref 3.5–5.2)
Sodium: 136 mmol/L (ref 134–144)
Total Bilirubin: 0.5 mg/dL (ref 0.0–1.2)
Total Protein: 6.5 g/dL (ref 6.0–8.5)

## 2013-12-21 LAB — VITAMIN B12: Vitamin B-12: 546 pg/mL (ref 211–946)

## 2013-12-21 LAB — VITAMIN D 25 HYDROXY (VIT D DEFICIENCY, FRACTURES): Vit D, 25-Hydroxy: 17.8 ng/mL — ABNORMAL LOW (ref 30.0–100.0)

## 2013-12-21 LAB — TSH: TSH: 1.16 u[IU]/mL (ref 0.450–4.500)

## 2013-12-23 ENCOUNTER — Other Ambulatory Visit: Payer: Self-pay | Admitting: Family Medicine

## 2013-12-23 MED ORDER — VITAMIN D (ERGOCALCIFEROL) 1.25 MG (50000 UNIT) PO CAPS: 50000.0000 [IU] | ORAL_CAPSULE | ORAL | Status: DC

## 2013-12-25 ENCOUNTER — Telehealth: Payer: Self-pay | Admitting: Family Medicine

## 2013-12-25 NOTE — Telephone Encounter (Signed)
Explained Vit D results and prescription.  Patient will give this a try and follow up as planned.

## 2013-12-29 ENCOUNTER — Telehealth: Payer: Self-pay | Admitting: Family Medicine

## 2013-12-29 NOTE — Telephone Encounter (Signed)
No samples at this time

## 2014-01-08 ENCOUNTER — Telehealth: Payer: Self-pay | Admitting: Family Medicine

## 2014-01-08 NOTE — Telephone Encounter (Signed)
Pt's wife notified of no samples available Will call back

## 2014-01-12 ENCOUNTER — Other Ambulatory Visit: Payer: Self-pay

## 2014-01-12 MED ORDER — METOPROLOL TARTRATE 25 MG PO TABS: 25.0000 mg | ORAL_TABLET | Freq: Two times a day (BID) | ORAL | Status: DC

## 2014-01-25 ENCOUNTER — Encounter: Payer: Self-pay | Admitting: Pharmacist

## 2014-01-25 ENCOUNTER — Ambulatory Visit (INDEPENDENT_AMBULATORY_CARE_PROVIDER_SITE_OTHER): Payer: Medicare Other | Admitting: Pharmacist

## 2014-01-25 VITALS — BP 136/84 | HR 70 | Ht 74.0 in | Wt 236.5 lb

## 2014-01-25 DIAGNOSIS — E118 Type 2 diabetes mellitus with unspecified complications: Secondary | ICD-10-CM

## 2014-01-25 DIAGNOSIS — E669 Obesity, unspecified: Secondary | ICD-10-CM

## 2014-01-25 DIAGNOSIS — R635 Abnormal weight gain: Secondary | ICD-10-CM

## 2014-01-25 DIAGNOSIS — E785 Hyperlipidemia, unspecified: Secondary | ICD-10-CM

## 2014-01-25 MED ORDER — INSULIN NPH ISOPHANE & REGULAR (70-30) 100 UNIT/ML ~~LOC~~ SUSP: SUBCUTANEOUS | Status: DC

## 2014-01-25 MED ORDER — INSULIN NPH ISOPHANE & REGULAR (70-30) 100 UNIT/ML ~~LOC~~ SUSP: 55.0000 [IU] | Freq: Two times a day (BID) | SUBCUTANEOUS | Status: DC

## 2014-01-25 NOTE — Progress Notes (Signed)
Diabetes Visit Chief Complaint:   Chief Complaint  Patient presents with  . Diabetes     Filed Vitals:   01/25/14 0959  BP: 136/84  Pulse: 70   Filed Weights   01/25/14 0959  Weight: 236 lb 8 oz (107.276 kg)     HPI:  Patient is here to follow up uncontrolled type 2 DM.   He has had type 2 Dm for several years.  He admits to being non compliant with diet recommendations.   Currently he is taking Januvia 100mg  1 tablet daily, Relion 70/30 insulin 55 units BID.  Humalog was added early August by Dietrich Pates with instructions to check BG prior to each meal and give Humalog according to this equation BG - 150 divided by 15. He has continued to received steroid injections into joint and back in August.  He is seeing Dr Glenna Fellows - neurosurgeon and Dr Francesco Runner - pain management.  Surgery is scheduled for 02/07/14. He takes generic percocet as needed for pain which causes a lot of constipation and sweating.  He was given linzess at his last visit by Dietrich Pates which has worked great for constipation.   He also has hypertriglyceriemia - Which improved slightly from January 2015 until last checked 07/2013.  Mostly likely worsened by uncontrolled type 2 DM and diet.  He is currently taking pravachol 20mg  1 tablet daily though he is suppose to take 2 tablets a day - he is not because increased muscle pain.     Has decreased smoking but has not quit completely - down to less than 1/2 pack per week.  Exam Edema:  negative  Polyuria:  negative  Polydipsia:  negative Polyphagia:  negative  BMI:  Body mass index is 30.35 kg/(m^2).   Weight changes:  stable General Appearance:  alert, oriented, no acute distress and obese Mood/Affect:  normal   Low fat/carbohydrate diet?  No  Has decreased sweet and potato intake but still states he eats large portion sizes.  He has started drinking about 4 quarts of Powerade daily Nicotine Abuse?  Yes Medication Compliance?  Yes Exercise?  No Alcohol Abuse?   No  Home BG Monitoring:  Check daily now HBG range - 200 to 350    Lab Results  Component Value Date   HGBA1C 12.3% 12/20/2013    No results found for this basename: Derl Barrow    Lab Results  Component Value Date   CHOL 215* 08/07/2013   HDL 22* 08/07/2013   LDLCALC UNABLE TO CALCULATE IF TRIGLYCERIDE OVER 400 mg/dL 08/07/2013   TRIG 475* 08/07/2013   CHOLHDL 9.8 08/07/2013    Previous Tg was 475 (08/07/2013)  Assessment: 1.  Diabetes.  Uncontrolled  2.  Blood Pressure.  At goal today 3.  Lipids.  Uncontrolled - Tg improved but too high and unable to assess LDL 4.  Obesity - dietary non compliance 5.  Tobacco Abuse - patient in action stage of quittin 6.  Constipation -likely related to pain medication.  Improved with Linzess   Recommendations: 1.  Medication recommendations at this time are as follows:      Increase Relion 70/30 to 60 units BID for 2 days then increase to 65 units BID.     Continue to use Novolog / Humalog per PCP's instrutions - BG - 150/15 (but I hope to disconntinue and patient is instructed to     monitor for s/s of hypoglycemia)    Continue Januvia 100mg  daily  Continue Linzess as needed for constipation - #12 samples given    2.  Reviewed HBG goals:  Fasting 80-130 and 1-2 hour post prandial <180.  Continue to check BG 3 times per day.    3.  BP goal < 140/80 - continue metoproolol and lisinopril at current dose 4.  LDL goal of < 100, HDL > 40 and TG < 150 - Better BG control needed to decrease Tg 5.  Dietary recommendations:  Reviewed serving sizes.  Recommended decrease amount of Powerade intake due to sodium content. 7.  Physical Activity recommendations:  Will not recommend at this time since up coming surgery planned. 8.  Return to clinic in 4 wks   Time spent counseling patient:  60 minutes    Cherre Robins, PharmD, CPP, CDE

## 2014-01-25 NOTE — Patient Instructions (Signed)
Increase Relion 70/30 to 60 units twice a day for 2 days, then increase to 65 units twice a day.    Diabetes and Standards of Medical Care  Diabetes is complicated. You may find that your diabetes team includes a dietitian, nurse, diabetes educator, eye doctor, and more. To help everyone know what is going on and to help you get the care you deserve, the following schedule of care was developed to help keep you on track. Below are the tests, exams, vaccines, medicines, education, and plans you will need.  Blood Glucose Goals Prior to meals = 80 - 130 Within 2 hours of the start of a meal = less than 180  HbA1c test (goal is less than 7.0% - your last value was 12.4%) This test shows how well you have controlled your glucose over the past 2 3 months. It is used to see if your diabetes management plan needs to be adjusted.   It is performed at least 2 times a year if you are meeting treatment goals.  It is performed 4 times a year if therapy has changed or if you are not meeting treatment goals.   Blood pressure test  This test is performed at every routine medical visit. The goal is less than 140/90 mmHg for most people, but 130/80 mmHg in some cases. Ask your health care provider about your goal. Dental exam  Follow up with the dentist regularly. Eye exam  If you are diagnosed with type 1 diabetes as a child, get an exam upon reaching the age of 10 years or older and have had diabetes for 3 5 years. Yearly eye exams are recommended after that initial eye exam.  If you are diagnosed with type 1 diabetes as an adult, get an exam within 5 years of diagnosis and then yearly.  If you are diagnosed with type 2 diabetes, get an exam as soon as possible after the diagnosis and then yearly. Foot care exam  Visual foot exams are performed at every routine medical visit. The exams check for cuts, injuries, or other problems with the feet.  A comprehensive foot exam should be done yearly.  This includes visual inspection as well as assessing foot pulses and testing for loss of sensation.  Check your feet nightly for cuts, injuries, or other problems with your feet. Tell your health care provider if anything is not healing. Kidney function test (urine microalbumin)  This test is performed once a year.  Type 1 diabetes: The first test is performed 5 years after diagnosis.  Type 2 diabetes: The first test is performed at the time of diagnosis.  A serum creatinine and estimated glomerular filtration rate (eGFR) test is done once a year to assess the level of chronic kidney disease (CKD), if present. Lipid profile (cholesterol, HDL, LDL, triglycerides)  Performed every 5 years for most people.  The goal for LDL is less than 100 mg/dL. If you are at high risk, the goal is less than 70 mg/dL.  The goal for HDL is 40 mg/dL 50 mg/dL for men and 50 mg/dL 60 mg/dL for women. An HDL cholesterol of 60 mg/dL or higher gives some protection against heart disease.  The goal for triglycerides is less than 150 mg/dL. Influenza vaccine, pneumococcal vaccine, and hepatitis B vaccine  The influenza vaccine is recommended yearly.  The pneumococcal vaccine is generally given once in a lifetime. However, there are some instances when another vaccination is recommended. Check with your health care provider.  The hepatitis B vaccine is also recommended for adults with diabetes. Diabetes self-management education  Education is recommended at diagnosis and ongoing as needed. Treatment plan  Your treatment plan is reviewed at every medical visit. Document Released: 03/01/2009 Document Revised: 01/04/2013 Document Reviewed: 10/04/2012 [facility] Patient Information 2014 [place].

## 2014-01-26 ENCOUNTER — Telehealth: Payer: Self-pay | Admitting: Family Medicine

## 2014-01-26 MED ORDER — INSULIN NPH ISOPHANE & REGULAR (70-30) 100 UNIT/ML ~~LOC~~ SUSP
SUBCUTANEOUS | Status: DC
Start: 2014-01-26 — End: 2014-03-08

## 2014-01-26 NOTE — Telephone Encounter (Signed)
Rx was changed and patient notified.

## 2014-02-02 ENCOUNTER — Telehealth: Payer: Self-pay | Admitting: Pharmacist

## 2014-02-02 NOTE — Telephone Encounter (Signed)
Called patient to follow up after increasing insulin dose last week. [name] [name] reports that BG is better but still having some BG in am above 200.  Recommended he increase Relion Mix 70/30 to 67 units BID for the next 2 days - if still having BG greater than 150 then to increase to 70 units BID.

## 2014-02-09 ENCOUNTER — Telehealth: Payer: Self-pay | Admitting: Pharmacist

## 2014-02-09 NOTE — Telephone Encounter (Signed)
Called to check on back surgery from 02/07/14.  Patinet didn't answer phone but left message.

## 2014-02-09 NOTE — Telephone Encounter (Signed)
Message copied by Cherre Robins on Fri Feb 09, 2014  1:21 PM ------      Message from: Cherre Robins      Created: Fri Feb 02, 2014  8:41 AM       Call patient after back surgery 02/07/14  ------

## 2014-02-26 ENCOUNTER — Telehealth: Payer: Self-pay | Admitting: Pharmacist

## 2014-02-26 ENCOUNTER — Other Ambulatory Visit: Payer: Self-pay | Admitting: *Deleted

## 2014-02-26 MED ORDER — PRAVASTATIN SODIUM 20 MG PO TABS: 40.0000 mg | ORAL_TABLET | Freq: Every day | ORAL | Status: DC

## 2014-02-26 MED ORDER — SITAGLIPTIN PHOSPHATE 100 MG PO TABS: 100.0000 mg | ORAL_TABLET | Freq: Every day | ORAL | Status: DC

## 2014-02-26 NOTE — Telephone Encounter (Signed)
#  21 samples of Januvia 100mg  left at front desk.

## 2014-03-08 ENCOUNTER — Encounter: Payer: Self-pay | Admitting: Pharmacist

## 2014-03-08 ENCOUNTER — Ambulatory Visit (INDEPENDENT_AMBULATORY_CARE_PROVIDER_SITE_OTHER): Payer: Medicare Other | Admitting: Pharmacist

## 2014-03-08 VITALS — BP 158/84 | HR 68 | Ht 74.0 in | Wt 267.0 lb

## 2014-03-08 DIAGNOSIS — E669 Obesity, unspecified: Secondary | ICD-10-CM

## 2014-03-08 DIAGNOSIS — I10 Essential (primary) hypertension: Secondary | ICD-10-CM

## 2014-03-08 DIAGNOSIS — E1169 Type 2 diabetes mellitus with other specified complication: Secondary | ICD-10-CM

## 2014-03-08 DIAGNOSIS — E118 Type 2 diabetes mellitus with unspecified complications: Secondary | ICD-10-CM

## 2014-03-08 DIAGNOSIS — E785 Hyperlipidemia, unspecified: Secondary | ICD-10-CM

## 2014-03-08 MED ORDER — INSULIN NPH ISOPHANE & REGULAR (70-30) 100 UNIT/ML ~~LOC~~ SUSP: SUBCUTANEOUS | Status: DC

## 2014-03-08 MED ORDER — LISINOPRIL 5 MG PO TABS: 5.0000 mg | ORAL_TABLET | Freq: Every day | ORAL | Status: DC

## 2014-03-08 NOTE — Patient Instructions (Signed)
Diabetes and Standards of Medical Care  Diabetes is complicated. You may find that your diabetes team includes a dietitian, nurse, diabetes educator, eye doctor, and more. To help everyone know what is going on and to help you get the care you deserve, the following schedule of care was developed to help keep you on track. Below are the tests, exams, vaccines, medicines, education, and plans you will need.  Blood Glucose Goals Prior to meals = 80 - 130 Within 2 hours of the start of a meal = less than 180  HbA1c test (goal is less than 7.0% - your last value was 12.3%) This test shows how well you have controlled your glucose over the past 2 3 months. It is used to see if your diabetes management plan needs to be adjusted.   It is performed at least 2 times a year if you are meeting treatment goals.  It is performed 4 times a year if therapy has changed or if you are not meeting treatment goals.   Blood pressure test  This test is performed at every routine medical visit. The goal is less than 140/90 mmHg for most people, but 130/80 mmHg in some cases. Ask your health care provider about your goal. Dental exam  Follow up with the dentist regularly.  Eye exam  If you are diagnosed with type 1 diabetes as a child, get an exam upon reaching the age of 38 years or older and have had diabetes for 3 5 years. Yearly eye exams are recommended after that initial eye exam.  If you are diagnosed with type 1 diabetes as an adult, get an exam within 5 years of diagnosis and then yearly.  If you are diagnosed with type 2 diabetes, get an exam as soon as possible after the diagnosis and then yearly.   Foot care exam  Visual foot exams are performed at every routine medical visit. The exams check for cuts, injuries, or other problems with the feet.  A comprehensive foot exam should be done yearly. This includes visual inspection as well as assessing foot pulses and testing for loss of  sensation.  Check your feet nightly for cuts, injuries, or other problems with your feet. Tell your health care provider if anything is not healing. Kidney function test (urine microalbumin)  This test is performed once a year.  Type 1 diabetes: The first test is performed 5 years after diagnosis.  Type 2 diabetes: The first test is performed at the time of diagnosis.  A serum creatinine and estimated glomerular filtration rate (eGFR) test is done once a year to assess the level of chronic kidney disease (CKD), if present. Lipid profile (cholesterol, HDL, LDL, triglycerides)  Performed every 5 years for most people.  The goal for LDL is less than 100 mg/dL. If you are at high risk, the goal is less than 70 mg/dL.  The goal for HDL is 40 mg/dL 50 mg/dL for men and 50 mg/dL 60 mg/dL for women. An HDL cholesterol of 60 mg/dL or higher gives some protection against heart disease.  The goal for triglycerides is less than 150 mg/dL. Influenza vaccine, pneumococcal vaccine, and hepatitis B vaccine  The influenza vaccine is recommended yearly.  The pneumococcal vaccine is generally given once in a lifetime. However, there are some instances when another vaccination is recommended. Check with your health care provider.  The hepatitis B vaccine is also recommended for adults with diabetes. Diabetes self-management education  Education is recommended at  diagnosis and ongoing as needed. Treatment plan  Your treatment plan is reviewed at every medical visit. Document Released: 03/01/2009 Document Revised: 01/04/2013 Document Reviewed: 10/04/2012 [employer] Patient Information 2014 [employer].   

## 2014-03-08 NOTE — Progress Notes (Addendum)
Diabetes Visit Chief Complaint:   Chief Complaint  Patient presents with  . Diabetes     Filed Vitals:   03/08/14 1327  BP: 158/84  Pulse: 68   Filed Weights   03/08/14 1327  Weight: 267 lb (121.11 kg)     HPI:  Patient is here to follow up uncontrolled type 2 DM.   He has had type 2 Dm for several years.  He continues to be non compliant with diet recommendations but improving some.   Currently he is taking Januvia 100mg  1 tablet daily, Relion 70/30 insulin 70 units BID.  No longer using Humalog or Novolin  He recently had back surgery 02/07/2014  by Dr Glenna Fellows - neurosurgeon and sees Dr Francesco Runner for pain management.  Surgery went well and patient reports that pain has greatly improved.  He takes generic percocet 1 or 2 tablets a day as needed but usually only needs at night. He was given linzess  by Dietrich Pates which has worked great for constipation but has not needed as often due to decrease in the need for pain medication.   Has decreased smoking but has not quit completely - down to less than 1/2 pack per week.  He is trying to quit.   Does not wish to take pharmacotherapy for smoking cessation.  Exam Edema:  negative  Polyuria:  negative  Polydipsia:  negative Polyphagia:  negative  BMI:  Body mass index is 34.27 kg/(m^2).   Weight changes:  Increased with recent decreased activity after surgery General Appearance:  alert, oriented, no acute distress and obese Mood/Affect:  normal   Low fat/carbohydrate diet?  No  He is no longer drinking Powerade but reports that he still struggles with portion sizes and eating early AM Nicotine Abuse?  Yes Medication Compliance?  Yes Exercise?  No Alcohol Abuse?  No  Home BG Monitoring:  Check daily now HBG range - 180 to 300    Lab Results  Component Value Date   HGBA1C 12.3% 12/20/2013    No results found for this basename: Derl Barrow    Lab Results  Component Value Date   CHOL 215* 08/07/2013   HDL 22*  08/07/2013   LDLCALC UNABLE TO CALCULATE IF TRIGLYCERIDE OVER 400 mg/dL 08/07/2013   TRIG 475* 08/07/2013   CHOLHDL 9.8 08/07/2013    Previous Tg was 475 (08/07/2013)  Assessment: 1.  Diabetes.  Uncontrolled but improving 2.  Blood Pressure.  Elevated today 3.  Lipids.  Uncontrolled - Tg improved but too high and unable to assess LDL 4.  Obesity - dietary non compliance 5.  Tobacco Abuse - patient trying to cut back on use 6.  Constipation - improved since requiring less pain medicaiton   Recommendations: 1.  Medication recommendations at this time are as follows:    Increase Relion 70/30 to 75 units BID    Continue Januvia 100mg  daily  Continue Linzess as needed for constipation - #12 samples given  Increase lisinopril to 5mg  1 tablet daily (can take 2 of 2.5mg  until uses up what he has on hand)    2.  Reviewed HBG goals:  Fasting 80-130 and 1-2 hour post prandial <180.  Continue to check BG 3 times per day.    3.  BP goal < 140/80 - continue metoproolol and lisinopril at current dose 4.  LDL goal of < 100, HDL > 40 and TG < 150 - Better BG control needed to decrease Tg.  Patient not fasting  today - has appt with PCP in 1 month to check labs and follow up 5.  Dietary recommendations:  Reviewed serving sizes.  Reviewed CHO content of various foods and serving sizes 7.  Physical Activity recommendations:  Will not recommend until cleared by neurosurgeon - next appt 2 weeks 8.  Return to clinic in 4 wks to see PCP;  RTC to see me in 3 months   Time spent counseling patient:  60 minutes    Cherre Robins, PharmD, CPP, CDE

## 2014-03-12 ENCOUNTER — Other Ambulatory Visit: Payer: Self-pay | Admitting: *Deleted

## 2014-03-12 MED ORDER — PRAVASTATIN SODIUM 40 MG PO TABS: ORAL_TABLET | ORAL | Status: DC

## 2014-04-05 ENCOUNTER — Telehealth: Payer: Self-pay | Admitting: Pharmacist

## 2014-04-05 MED ORDER — SITAGLIPTIN PHOSPHATE 100 MG PO TABS: 100.0000 mg | ORAL_TABLET | Freq: Every day | ORAL | Status: DC

## 2014-04-05 NOTE — Telephone Encounter (Signed)
Patient notified - samples left at front desk.  #14 samples

## 2014-04-06 ENCOUNTER — Encounter: Payer: Self-pay | Admitting: Family Medicine

## 2014-04-06 ENCOUNTER — Ambulatory Visit (INDEPENDENT_AMBULATORY_CARE_PROVIDER_SITE_OTHER): Payer: Medicare Other | Admitting: Family Medicine

## 2014-04-06 VITALS — BP 138/73 | HR 64 | Temp 96.4°F | Ht 74.0 in | Wt 267.4 lb

## 2014-04-06 DIAGNOSIS — I2510 Atherosclerotic heart disease of native coronary artery without angina pectoris: Secondary | ICD-10-CM

## 2014-04-06 DIAGNOSIS — N529 Male erectile dysfunction, unspecified: Secondary | ICD-10-CM

## 2014-04-06 DIAGNOSIS — M1990 Unspecified osteoarthritis, unspecified site: Secondary | ICD-10-CM

## 2014-04-06 DIAGNOSIS — E1121 Type 2 diabetes mellitus with diabetic nephropathy: Secondary | ICD-10-CM

## 2014-04-06 LAB — POCT GLYCOSYLATED HEMOGLOBIN (HGB A1C): Hemoglobin A1C: 8.9

## 2014-04-06 MED ORDER — CARBIDOPA-LEVODOPA ER 50-200 MG PO TBCR: 1.0000 | EXTENDED_RELEASE_TABLET | Freq: Two times a day (BID) | ORAL | Status: DC

## 2014-04-06 MED ORDER — PRAVASTATIN SODIUM 40 MG PO TABS: ORAL_TABLET | ORAL | Status: DC

## 2014-04-06 MED ORDER — GABAPENTIN 300 MG PO CAPS: 300.0000 mg | ORAL_CAPSULE | Freq: Three times a day (TID) | ORAL | Status: DC

## 2014-04-06 MED ORDER — LISINOPRIL 5 MG PO TABS: 5.0000 mg | ORAL_TABLET | Freq: Every day | ORAL | Status: DC

## 2014-04-06 MED ORDER — MELOXICAM 15 MG PO TABS: 15.0000 mg | ORAL_TABLET | Freq: Every day | ORAL | Status: DC

## 2014-04-06 NOTE — Progress Notes (Signed)
   Subjective:    Patient ID: [name] [name], male    DOB: 01-Feb-1951, 63 y.o.   MRN: [id number]  HPI Patient is here for follow up.  He is feeling better since having surgery on his cervical spine by Dr. [name] Neurosurgery.  He is no longer taking anymore oxycodone pain medicine and is no longer needing linzess for constipation.  He is taking 70/30 insulin 75 units bid and he feels better.  His legs bother him at night and his wife states she got him some otc RLS medicine and it helps.  His pain in his legs is better with the neurontin and with improvement of his diabetes.  He sees Production manager for diabetes.  He has been having some left knee pain and arthritis sx's.  He c/o lower extremity claudication sx's.  He has impotence and cannot get an erection and would like to see a neurologist and see if he can get a pump to help him become erect so he can be sexually active with his wife again.  Review of Systems  Constitutional: Negative for fever.  HENT: Negative for ear pain.   Eyes: Negative for discharge.  Respiratory: Negative for cough.   Cardiovascular: Negative for chest pain.  Gastrointestinal: Negative for abdominal distention.  Endocrine: Negative for polyuria.  Genitourinary: Negative for difficulty urinating.  Musculoskeletal: Negative for gait problem and neck pain.  Skin: Negative for color change and rash.  Neurological: Negative for speech difficulty and headaches.  Psychiatric/Behavioral: Negative for agitation.       Objective:    BP 138/73 mmHg  Pulse 64  Temp(Src) 96.4 F (35.8 C) (Oral)  Ht $R'6\' 2"'es$  (1.88 m)  Wt 267 lb 6.4 oz (121.292 kg)  BMI 34.32 kg/m2   Physical Exam  Constitutional: He is oriented to person, place, and time. He appears well-developed and well-nourished.  HENT:  Head: Normocephalic and atraumatic.  Mouth/Throat: Oropharynx is clear and moist.  Eyes: Pupils are equal, round, and reactive to light.  Neck: Normal range of  motion. Neck supple.  Cardiovascular: Normal rate and regular rhythm.   No murmur heard. Pulmonary/Chest: Effort normal and breath sounds normal.  Abdominal: Soft. Bowel sounds are normal. There is no tenderness.  Neurological: He is alert and oriented to person, place, and time.  Skin: Skin is warm and dry.  Psychiatric: He has a normal mood and affect.          Assessment & Plan:     ICD-9-CM ICD-10-CM   1. Type 2 diabetes mellitus with diabetic nephropathy 250.40 E11.21 POCT glycosylated hemoglobin (Hb A1C)   583.81  CMP14+EGFR     lisinopril (PRINIVIL,ZESTRIL) 5 MG tablet     gabapentin (NEURONTIN) 300 MG capsule     pravastatin (PRAVACHOL) 40 MG tablet     CANCELED: POCT CBC  2. Arthritis 716.90 M19.90 meloxicam (MOBIC) 15 MG tablet     CBC With differential/Platelet  3. Impotence 607.84 N52.9 Ambulatory referral to Urology     CBC With differential/Platelet  4. ASCVD (arteriosclerotic cardiovascular disease) 429.2 I25.10 Lower Extremity Arterial Doppler   440.9  CBC With differential/Platelet     Return in about 3 months (around 07/07/2014).  [name] FNP

## 2014-04-09 LAB — CBC WITH DIFFERENTIAL
Basophils Absolute: 0.1 10*3/uL (ref 0.0–0.2)
Basos: 1 %
Eos: 2 %
Eosinophils Absolute: 0.1 10*3/uL (ref 0.0–0.4)
HCT: 48.2 % (ref 37.5–51.0)
Hemoglobin: 16.1 g/dL (ref 12.6–17.7)
Immature Grans (Abs): 0 10*3/uL (ref 0.0–0.1)
Immature Granulocytes: 0 %
Lymphocytes Absolute: 2.1 10*3/uL (ref 0.7–3.1)
Lymphs: 22 %
MCH: 29.4 pg (ref 26.6–33.0)
MCHC: 33.4 g/dL (ref 31.5–35.7)
MCV: 88 fL (ref 79–97)
Monocytes Absolute: 0.7 10*3/uL (ref 0.1–0.9)
Monocytes: 7 %
Neutrophils Absolute: 6.4 10*3/uL (ref 1.4–7.0)
Neutrophils Relative %: 68 %
Platelets: 308 10*3/uL (ref 150–379)
RBC: 5.48 x10E6/uL (ref 4.14–5.80)
RDW: 13.2 % (ref 12.3–15.4)
WBC: 9.5 10*3/uL (ref 3.4–10.8)

## 2014-04-09 LAB — CMP14+EGFR
ALT: 24 IU/L (ref 0–44)
AST: 16 IU/L (ref 0–40)
Albumin/Globulin Ratio: 1.5 (ref 1.1–2.5)
Albumin: 4.4 g/dL (ref 3.6–4.8)
Alkaline Phosphatase: 92 IU/L (ref 39–117)
BUN/Creatinine Ratio: 17 (ref 10–22)
BUN: 20 mg/dL (ref 8–27)
CO2: 23 mmol/L (ref 18–29)
Calcium: 10.3 mg/dL — ABNORMAL HIGH (ref 8.6–10.2)
Chloride: 95 mmol/L — ABNORMAL LOW (ref 97–108)
Creatinine, Ser: 1.19 mg/dL (ref 0.76–1.27)
GFR calc Af Amer: 75 mL/min/{1.73_m2} (ref >59)
GFR calc non Af Amer: 65 mL/min/{1.73_m2} (ref >59)
Globulin, Total: 3 g/dL (ref 1.5–4.5)
Glucose: 290 mg/dL — ABNORMAL HIGH (ref 65–99)
Potassium: 4.8 mmol/L (ref 3.5–5.2)
Sodium: 136 mmol/L (ref 134–144)
Total Bilirubin: 0.4 mg/dL (ref 0.0–1.2)
Total Protein: 7.4 g/dL (ref 6.0–8.5)

## 2014-04-11 NOTE — Progress Notes (Signed)
11 A1c is abnormal at 8.9. Diabetic control needs to be better. CBC and blood chemistries showed no significant change from last tested

## 2014-04-16 ENCOUNTER — Other Ambulatory Visit: Payer: Self-pay | Admitting: Family Medicine

## 2014-04-16 DIAGNOSIS — E0822 Diabetes mellitus due to underlying condition with diabetic chronic kidney disease: Secondary | ICD-10-CM

## 2014-04-17 ENCOUNTER — Telehealth: Payer: Self-pay | Admitting: Family Medicine

## 2014-04-17 NOTE — Telephone Encounter (Signed)
Appointment given for 2:15 tomorrow with [name].

## 2014-04-18 ENCOUNTER — Encounter: Payer: Self-pay | Admitting: Family Medicine

## 2014-04-18 ENCOUNTER — Ambulatory Visit (INDEPENDENT_AMBULATORY_CARE_PROVIDER_SITE_OTHER): Payer: Medicare Other | Admitting: Family Medicine

## 2014-04-18 VITALS — BP 180/90 | HR 76 | Temp 97.6°F | Ht 74.0 in | Wt 271.8 lb

## 2014-04-18 DIAGNOSIS — J206 Acute bronchitis due to rhinovirus: Secondary | ICD-10-CM

## 2014-04-18 MED ORDER — SITAGLIPTIN PHOSPHATE 100 MG PO TABS: 100.0000 mg | ORAL_TABLET | Freq: Every day | ORAL | Status: DC

## 2014-04-18 MED ORDER — LEVOFLOXACIN 500 MG PO TABS: 500.0000 mg | ORAL_TABLET | Freq: Every day | ORAL | Status: DC

## 2014-04-18 MED ORDER — METHYLPREDNISOLONE ACETATE 80 MG/ML IJ SUSP
80.0000 mg | Freq: Once | INTRAMUSCULAR | Status: AC
  Administered 2014-04-18: 80 mg via INTRAMUSCULAR

## 2014-04-18 MED ORDER — HYDROCODONE-HOMATROPINE 5-1.5 MG/5ML PO SYRP: 5.0000 mL | ORAL_SOLUTION | Freq: Three times a day (TID) | ORAL | Status: DC | PRN

## 2014-04-18 NOTE — Progress Notes (Signed)
   Subjective:    Patient ID: [name] [name], male    DOB: September 06, 1950, 63 y.o.   MRN: [id number]  HPI Patient c/o uri sx's and cough.  Review of Systems  Constitutional: Negative for fever.  HENT: Negative for ear pain.   Eyes: Negative for discharge.  Respiratory: Negative for cough.   Cardiovascular: Negative for chest pain.  Gastrointestinal: Negative for abdominal distention.  Endocrine: Negative for polyuria.  Genitourinary: Negative for difficulty urinating.  Musculoskeletal: Negative for gait problem and neck pain.  Skin: Negative for color change and rash.  Neurological: Negative for speech difficulty and headaches.  Psychiatric/Behavioral: Negative for agitation.       Objective:    BP 180/90 mmHg  Pulse 76  Temp(Src) 97.6 F (36.4 C) (Oral)  Ht 6\' 2"  (1.88 m)  Wt 271 lb 12.8 oz (123.288 kg)  BMI 34.88 kg/m2 Physical Exam  Constitutional: He is oriented to person, place, and time. He appears well-developed and well-nourished.  HENT:  Head: Normocephalic and atraumatic.  Mouth/Throat: Oropharynx is clear and moist.  Eyes: Pupils are equal, round, and reactive to light.  Neck: Normal range of motion. Neck supple.  Cardiovascular: Normal rate and regular rhythm.   No murmur heard. Pulmonary/Chest: Effort normal and breath sounds normal.  Abdominal: Soft. Bowel sounds are normal. There is no tenderness.  Neurological: He is alert and oriented to person, place, and time.  Skin: Skin is warm and dry.  Psychiatric: He has a normal mood and affect.          Assessment & Plan:     ICD-9-CM ICD-10-CM   1. Acute bronchitis due to Rhinovirus 466.0 J20.6 methylPREDNISolone acetate (DEPO-MEDROL) injection 80 mg   079.3  levofloxacin (LEVAQUIN) 500 MG tablet     HYDROcodone-homatropine (HYCODAN) 5-1.5 MG/5ML syrup   Push po fluids, rest, tylenol and motrin otc prn as directed for fever, arthralgias, and myalgias.  Follow up prn if sx's continue or persist.  No  Follow-up on file.  [name] FNP

## 2014-04-19 ENCOUNTER — Other Ambulatory Visit (HOSPITAL_COMMUNITY): Payer: Self-pay | Admitting: Radiology

## 2014-04-19 ENCOUNTER — Other Ambulatory Visit: Payer: Self-pay | Admitting: Family Medicine

## 2014-04-19 ENCOUNTER — Encounter (INDEPENDENT_AMBULATORY_CARE_PROVIDER_SITE_OTHER): Payer: Medicare Other

## 2014-04-19 DIAGNOSIS — E1121 Type 2 diabetes mellitus with diabetic nephropathy: Secondary | ICD-10-CM

## 2014-04-19 DIAGNOSIS — I739 Peripheral vascular disease, unspecified: Secondary | ICD-10-CM

## 2014-04-19 DIAGNOSIS — I2510 Atherosclerotic heart disease of native coronary artery without angina pectoris: Secondary | ICD-10-CM

## 2014-04-23 ENCOUNTER — Telehealth: Payer: Self-pay | Admitting: Family Medicine

## 2014-04-23 NOTE — Telephone Encounter (Signed)
BG was down to 60 today after lunch and has been occurring every day for the last 2 weeks.  Currently taking Novolin 70/30 75 units BID.  Recommend decrease dose to 60 units BID.  COntinue to check regularly and call office for further adjustment if BG is less than 70 or greater than 200.

## 2014-05-01 ENCOUNTER — Encounter: Payer: Self-pay | Admitting: Surgery

## 2014-05-09 ENCOUNTER — Other Ambulatory Visit: Payer: Self-pay | Admitting: Physician Assistant

## 2014-05-15 ENCOUNTER — Encounter: Payer: Self-pay | Admitting: Surgery

## 2014-05-21 ENCOUNTER — Encounter: Payer: Self-pay | Admitting: Surgery

## 2014-05-21 ENCOUNTER — Ambulatory Visit (INDEPENDENT_AMBULATORY_CARE_PROVIDER_SITE_OTHER): Payer: Medicare Other | Admitting: Surgery

## 2014-05-21 DIAGNOSIS — I70219 Atherosclerosis of native arteries of extremities with intermittent claudication, unspecified extremity: Secondary | ICD-10-CM

## 2014-05-21 NOTE — Addendum Note (Signed)
Addended by: Mena Goes on: 05/21/2014 04:54 PM   Modules accepted: Orders

## 2014-05-21 NOTE — Progress Notes (Signed)
Referred by:  Chipper Herb, MD [place], [place] [address]  Reason for referral: bilateral leg pain  History of Present Illness  [name] [name] is a 64 y.o. (01/22/1951) male who presents with chief complaint: bilateral leg pain, left slightly worse than right.  Onset of symptoms occurred approximately 4-5 years ago and have worsened in the past 1-2 years.  Pain is described as "knots" and "cramps" in the calves with numbness and tingling of the feet and toes bilaterally. In addition, he also describes pain in his lower back. These symptoms are worst at night but occur throughout the day with both exercise and at rest. He notes pain with short distances and long distances intermittently. His pain is not consistent with walking a certain distance. He may be asymptomatic walking long distances at times and symptomatic during other periods. He has a 10 year history of lower back pain with "bulging disk." He has seen a spine physician in the past who told the patient his condition was inoperable. He has seen a chiropractor who has tremendously helped the patient's back pain symptoms. The chiropractor referred him to Dr. [name] regarding neck pain.  He recently had cervical spine surgery last year.  He denies any nonhealing wounds of the lower extremities. He describes swelling of his legs intermittently.   He has insulin dependent diabetes that is poorly controlled. He has hyperlipidemia managed on a statin. He has hypertension on two antihypertensives. He has a history of CAD s/p PCI and stent placement in 2010. He takes a daily aspirin. He is a current smoker.    Past Medical History  Diagnosis Date  . Diabetes mellitus   . Hypertension   . GERD (gastroesophageal reflux disease)   . Arthritis   . CAD (coronary artery disease)     2010  LAD 50%, RCA 100%.  DES to RCA.  EF 55%  . AODM   . Disorders of iron metabolism   . MURMUR   . CHEST PAIN-UNSPECIFIED   . DISORDERS OF IRON  METABOLISM   . Medically noncompliant   . Cellulitis and abscess rt groin   . Hyperlipidemia     Past Surgical History  Procedure Laterality Date  . Coronary stent placement  2000    By Dr. Olevia Perches  . Hernia repair  2000  . Back surgery  2015    ACDF by Dr. [name]  . Lesion removal      Lip and hand     History   Social History  . Marital Status: Married    Spouse Name: N/A    Number of Children: 2  . Years of Education: N/A   Occupational History  . Not on file.   Social History Main Topics  . Smoking status: Current Some Day Smoker -- 0.10 packs/day for 51 years    Types: Cigarettes  . Smokeless tobacco: Never Used  . Alcohol Use: No  . Drug Use: No  . Sexual Activity: Yes   Other Topics Concern  . Not on file   Social History Narrative   Lives alone.  Disabled.  Unable to afford medicines.      Family History  Problem Relation Age of Onset  . Diabetes Father   . Valvular heart disease Father   . Alzheimer's disease Mother     Current Outpatient Prescriptions on File Prior to Visit  Medication Sig Dispense Refill  . aspirin EC 81 MG tablet Take 81 mg by  mouth daily.    . carbidopa-levodopa (SINEMET CR) 50-200 MG per tablet Take 1 tablet by mouth 2 (two) times daily. 30 tablet 11  . gabapentin (NEURONTIN) 300 MG capsule Take 1 capsule (300 mg total) by mouth 3 (three) times daily. 90 capsule 11  . HYDROcodone-homatropine (HYCODAN) 5-1.5 MG/5ML syrup Take 5 mLs by mouth every 8 (eight) hours as needed for cough. 120 mL 0  . insulin NPH-regular Human (NOVOLIN 70/30 RELION) (70-30) 100 UNIT/ML injection Inject 75 units twice a day 50 mL 2  . levofloxacin (LEVAQUIN) 500 MG tablet Take 1 tablet (500 mg total) by mouth daily. 10 tablet 0  . lisinopril (PRINIVIL,ZESTRIL) 5 MG tablet Take 1 tablet (5 mg total) by mouth daily. 30 tablet 11  . meloxicam (MOBIC) 15 MG tablet Take 1 tablet (15 mg total) by mouth daily. 30 tablet 11  . metoprolol tartrate (LOPRESSOR) 25 MG  tablet Take 1 tablet (25 mg total) by mouth 2 (two) times daily. 60 tablet 4  . omeprazole (PRILOSEC) 20 MG capsule Take 1 capsule (20 mg total) by mouth daily. 30 capsule 6  . omeprazole (PRILOSEC) 20 MG capsule TAKE ONE CAPSULE BY MOUTH ONCE DAILY 30 capsule 5  . pravastatin (PRAVACHOL) 40 MG tablet Take 1 tablet daily 30 tablet 11  . sitaGLIPtin (JANUVIA) 100 MG tablet Take 1 tablet (100 mg total) by mouth daily. 28 tablet 0   No current facility-administered medications on file prior to visit.    Allergies  Allergen Reactions  . Ace Inhibitors Other (See Comments) and Cough    CKD, renal failure   . Invokana [Canagliflozin] Other (See Comments)    Syncope / dehydration  . Metformin And Related Itching  . Horse-Derived Products     REACTION: Rash  . Crestor [Rosuvastatin] Other (See Comments)    Myalgias   . Lipitor [Atorvastatin] Other (See Comments)    myalgias    REVIEW OF SYSTEMS:  (Positives checked otherwise negative)  CARDIOVASCULAR:  [ ]  chest pain, [ ]  chest pressure, [ ]  palpitations, [ ]  shortness of breath when laying flat, [ ]  shortness of breath with exertion,   [x ] pain in legs when walking, [x ] pain in feet when laying flat, [ ]  history of blood clot in veins (DVT), [ ]  history of phlebitis, [x ] swelling in legs, [ ]  varicose veins  PULMONARY:  [ ]  productive cough, [ ]  asthma, [ ]  wheezing  NEUROLOGIC:  [ ]  weakness in arms or legs, [ x] numbness in arms or legs, [ ]  difficulty speaking or slurred speech, [ ]  temporary loss of vision in one eye, [ ]  dizziness  HEMATOLOGIC:  [ ]  bleeding problems, [ ]  problems with blood clotting too easily  MUSCULOSKEL:  [ ]  joint pain, [ ]  joint swelling  GASTROINTEST:  [ ]   Vomiting blood, [ ]   Blood in stool     GENITOURINARY:  [ ]   Burning with urination, [ ]   Blood in urine  PSYCHIATRIC:  [ ]  history of major depression  INTEGUMENTARY:  [ ]  rashes, [ ]  ulcers  CONSTITUTIONAL:  [ ]  fever, [ ]  chills  For VQI  Use Only  PRE-ADM LIVING: Home  AMB STATUS: Ambulatory  CAD Sx: None  PRIOR CHF: None  STRESS TEST: [ ]  No, [ ]  Normal, [ ]  + ischemia, [ ]  + MI, [ ]  Both    Physical Examination Filed Vitals:   05/21/14 1320  BP: 157/92  Pulse: 72  Temp: 97.7 F (36.5 C)  TempSrc: Oral  Resp: 16  Height: 6\' 3"  (1.905 m)  Weight: 267 lb (121.11 kg)  SpO2: 98%   Body mass index is 33.37 kg/(m^2).  General: A&O x 3, WDWN obese male in NAD  Head: [place]/AT  Neck: Supple, no nuchal rigidity  Pulmonary: Sym exp, good air movt, CTAB, no rales, rhonchi, & wheezing.  Cardiac: RRR, Nl S1, S2, no murmurs, rubs or gallops  Vascular: 2+ radial pulses bilaterally. 2+ femoral pulses bilaterally. Nonpalpable popliteal and pedal pulses bilaterally.   Gastrointestinal: soft, obese, NTND, - HSM, - masses  Musculoskeletal: M/S 5/5 throughout, Extremities without ischemic changes.  Neurologic: CN 2-12 grossly intact. Pain and light touch intact in extremities. Motor exam as listed above  Psychiatric: Judgment intact, Mood & affect appropriatefor pt's clinical situation  Dermatologic: Multiple small abrasions and scabs on anterior legs. No open wounds.   Non-Invasive Vascular Imaging  ABI (Date: 05/21/2014)  R: 0.88, DP,PT: biphasic, TBI: 0.56  L: 1.1, DP,PT: triphasicTBI: 0.68  Medical Decision Making  Maan A Colgan is a 64 y.o. male who presents with bilateral pseudoclaudication with suspected spinal etiology. He has a history of degenerative disk disease. He also has neuropathy of his lower extremities bilaterally and has recently been started on gabapentin. He admits some relief of symptoms with this. He has claudication with varying walking distances and sometimes at rest. He is not always symptomatic with walking long distances. His symptoms are not consistent with claudication related to arterial insufficiency.    His ABIs and waveflow studies indicate close to normal circulation of his  right leg and normal circulation of his left leg. He does have mild peripheral vascular disease given an ABI of 0.88 on the right. Discussed that he return to his chiropractor for further therapy and evaluation. He has had great progress with back pain symptoms since going to a chiropractor.   Discussed the need for maximal medical management through strict control of blood pressure, blood sugar and lipid levels, use of aspirin, exercise and cessation of smoking. The patient is currently on an aspirin, statin and blood pressure medications. His diabetes is not well-controlled with A1c of 8.9. He is being followed by Dr. Sabra Heck at [place]. Discussed that without maximal medical management, he may have progression of his peripheral vascular disease. We will see him back in one year with ABIs to continue monitoring his peripheral arterial disease.   Virgina Jock, PA-C [facility] Office: [phone] Pager: [phone]  05/21/2014, 2:18 PM  This patient was seen and examined in conjunction with Dr. [name].    Addendum: I agree with the above.  I have seen and examined the patient.  I do not feel that his symptoms are related to vascular insufficiency.  He has a normal ankle-brachial index on the left and 0.88 on the right.  His symptoms are certainly not classic as they occur both at rest and with activity, although they are exacerbated with activity.  He does have a history of lower back disease and I suspect this is the etiology of his complaints.  We did discuss that his ankle-brachial index is are not normal.  Therefore, I think for heart attack and stroke prevention, he needs to be treated as if he has peripheral vascular disease.  This will include taking an aspirin as well as optimization of his cholesterol profile with a statin and aggressive blood pressure management.  He does state that  he has white coat hypertension, which is why his  blood pressure is elevated today.  He is scheduled to see Dr. [name] tomorrow for further evaluation of his back.  I'm going to have him follow-up with me in 1 year for a repeat ankle-brachial index.  Annamarie Major

## 2014-05-22 DIAGNOSIS — G459 Transient cerebral ischemic attack, unspecified: Secondary | ICD-10-CM | POA: Diagnosis not present

## 2014-05-22 DIAGNOSIS — G629 Polyneuropathy, unspecified: Secondary | ICD-10-CM | POA: Diagnosis not present

## 2014-05-22 DIAGNOSIS — M5021 Other cervical disc displacement,  high cervical region: Secondary | ICD-10-CM | POA: Diagnosis not present

## 2014-05-23 DIAGNOSIS — N5201 Erectile dysfunction due to arterial insufficiency: Secondary | ICD-10-CM | POA: Diagnosis not present

## 2014-05-24 ENCOUNTER — Telehealth: Payer: Self-pay | Admitting: Family Medicine

## 2014-05-24 MED ORDER — SITAGLIPTIN PHOSPHATE 100 MG PO TABS: 100.0000 mg | ORAL_TABLET | Freq: Every day | ORAL | Status: DC

## 2014-05-24 NOTE — Telephone Encounter (Signed)
Left #28 samples of JaNuvia 100mg  at front desk and patient's wife [name] notified.

## 2014-05-30 DIAGNOSIS — E291 Testicular hypofunction: Secondary | ICD-10-CM | POA: Diagnosis not present

## 2014-06-04 ENCOUNTER — Other Ambulatory Visit (INDEPENDENT_AMBULATORY_CARE_PROVIDER_SITE_OTHER): Payer: Medicare Other

## 2014-06-04 DIAGNOSIS — E291 Testicular hypofunction: Secondary | ICD-10-CM | POA: Diagnosis not present

## 2014-06-05 LAB — TESTOSTERONE,FREE AND TOTAL
Testosterone, Free: 23.1 pg/mL — ABNORMAL HIGH (ref 6.6–18.1)
Testosterone: 972 ng/dL (ref 348–1197)

## 2014-06-11 ENCOUNTER — Other Ambulatory Visit (INDEPENDENT_AMBULATORY_CARE_PROVIDER_SITE_OTHER): Payer: Medicare Other

## 2014-06-11 DIAGNOSIS — E291 Testicular hypofunction: Secondary | ICD-10-CM

## 2014-06-12 LAB — TESTOSTERONE,FREE AND TOTAL
Testosterone, Free: 8.2 pg/mL (ref 6.6–18.1)
Testosterone: 387 ng/dL (ref 348–1197)

## 2014-06-19 ENCOUNTER — Telehealth: Payer: Self-pay | Admitting: Family Medicine

## 2014-06-22 ENCOUNTER — Other Ambulatory Visit: Payer: Self-pay | Admitting: *Deleted

## 2014-06-22 MED ORDER — METOPROLOL TARTRATE 25 MG PO TABS: 25.0000 mg | ORAL_TABLET | Freq: Two times a day (BID) | ORAL | Status: DC

## 2014-06-25 MED ORDER — SITAGLIPTIN PHOSPHATE 100 MG PO TABS: 100.0000 mg | ORAL_TABLET | Freq: Every day | ORAL | Status: DC

## 2014-06-25 NOTE — Telephone Encounter (Signed)
Samples available for pickup at front desk

## 2014-07-20 ENCOUNTER — Telehealth: Payer: Self-pay | Admitting: Pharmacist

## 2014-07-20 MED ORDER — SITAGLIPTIN PHOSPHATE 100 MG PO TABS: 100.0000 mg | ORAL_TABLET | Freq: Every day | ORAL | Status: DC

## 2014-07-20 NOTE — Telephone Encounter (Signed)
Left #35 samples of januvia at front desk - patient notified.

## 2014-07-30 ENCOUNTER — Emergency Department (HOSPITAL_COMMUNITY): Payer: Medicare Other

## 2014-07-30 ENCOUNTER — Encounter (HOSPITAL_COMMUNITY): Payer: Self-pay | Admitting: Emergency Medicine

## 2014-07-30 ENCOUNTER — Observation Stay (HOSPITAL_COMMUNITY)
Admission: EM | Admit: 2014-07-30 | Discharge: 2014-07-31 | Disposition: A | Discharge status: Home / Self Care | Payer: Medicare Other | Attending: Internal Medicine | Admitting: Internal Medicine

## 2014-07-30 DIAGNOSIS — R079 Chest pain, unspecified: Secondary | ICD-10-CM | POA: Diagnosis not present

## 2014-07-30 DIAGNOSIS — Z79899 Other long term (current) drug therapy: Secondary | ICD-10-CM | POA: Diagnosis not present

## 2014-07-30 DIAGNOSIS — R0789 Other chest pain: Secondary | ICD-10-CM | POA: Diagnosis not present

## 2014-07-30 DIAGNOSIS — Z9119 Patient's noncompliance with other medical treatment and regimen: Secondary | ICD-10-CM | POA: Diagnosis not present

## 2014-07-30 DIAGNOSIS — E118 Type 2 diabetes mellitus with unspecified complications: Secondary | ICD-10-CM | POA: Diagnosis not present

## 2014-07-30 DIAGNOSIS — I2510 Atherosclerotic heart disease of native coronary artery without angina pectoris: Secondary | ICD-10-CM

## 2014-07-30 DIAGNOSIS — Z720 Tobacco use: Secondary | ICD-10-CM | POA: Insufficient documentation

## 2014-07-30 DIAGNOSIS — I10 Essential (primary) hypertension: Secondary | ICD-10-CM

## 2014-07-30 DIAGNOSIS — Z9861 Coronary angioplasty status: Secondary | ICD-10-CM

## 2014-07-30 DIAGNOSIS — R1013 Epigastric pain: Secondary | ICD-10-CM | POA: Insufficient documentation

## 2014-07-30 DIAGNOSIS — Z7982 Long term (current) use of aspirin: Secondary | ICD-10-CM | POA: Diagnosis not present

## 2014-07-30 DIAGNOSIS — K219 Gastro-esophageal reflux disease without esophagitis: Secondary | ICD-10-CM | POA: Insufficient documentation

## 2014-07-30 DIAGNOSIS — E119 Type 2 diabetes mellitus without complications: Secondary | ICD-10-CM | POA: Diagnosis not present

## 2014-07-30 DIAGNOSIS — Z794 Long term (current) use of insulin: Secondary | ICD-10-CM | POA: Insufficient documentation

## 2014-07-30 DIAGNOSIS — R011 Cardiac murmur, unspecified: Secondary | ICD-10-CM | POA: Diagnosis not present

## 2014-07-30 DIAGNOSIS — M1990 Unspecified osteoarthritis, unspecified site: Secondary | ICD-10-CM | POA: Insufficient documentation

## 2014-07-30 DIAGNOSIS — Z872 Personal history of diseases of the skin and subcutaneous tissue: Secondary | ICD-10-CM | POA: Insufficient documentation

## 2014-07-30 DIAGNOSIS — E785 Hyperlipidemia, unspecified: Secondary | ICD-10-CM | POA: Insufficient documentation

## 2014-07-30 DIAGNOSIS — E1159 Type 2 diabetes mellitus with other circulatory complications: Secondary | ICD-10-CM | POA: Diagnosis present

## 2014-07-30 DIAGNOSIS — Z7901 Long term (current) use of anticoagulants: Secondary | ICD-10-CM | POA: Insufficient documentation

## 2014-07-30 DIAGNOSIS — R0602 Shortness of breath: Secondary | ICD-10-CM | POA: Insufficient documentation

## 2014-07-30 DIAGNOSIS — E669 Obesity, unspecified: Secondary | ICD-10-CM | POA: Diagnosis present

## 2014-07-30 LAB — TROPONIN I
Troponin I: <0.03 ng/mL (ref <0.031)
Troponin I: <0.03 ng/mL (ref <0.031)
Troponin I: <0.03 ng/mL (ref <0.031)
Troponin I: <0.03 ng/mL (ref <0.031)

## 2014-07-30 LAB — CBC WITH DIFFERENTIAL/PLATELET
Basophils Absolute: 0.1 10*3/uL (ref 0.0–0.1)
Basophils Relative: 1 % (ref 0–1)
Eosinophils Absolute: 0.2 10*3/uL (ref 0.0–0.7)
Eosinophils Relative: 2 % (ref 0–5)
HCT: 46.8 % (ref 39.0–52.0)
Hemoglobin: 16.4 g/dL (ref 13.0–17.0)
Lymphocytes Relative: 21 % (ref 12–46)
Lymphs Abs: 2 10*3/uL (ref 0.7–4.0)
MCH: 29.7 pg (ref 26.0–34.0)
MCHC: 35 g/dL (ref 30.0–36.0)
MCV: 84.6 fL (ref 78.0–100.0)
Monocytes Absolute: 0.7 10*3/uL (ref 0.1–1.0)
Monocytes Relative: 7 % (ref 3–12)
Neutro Abs: 6.6 10*3/uL (ref 1.7–7.7)
Neutrophils Relative %: 69 % (ref 43–77)
Platelets: 209 10*3/uL (ref 150–400)
RBC: 5.53 MIL/uL (ref 4.22–5.81)
RDW: 12.6 % (ref 11.5–15.5)
WBC: 9.6 10*3/uL (ref 4.0–10.5)

## 2014-07-30 LAB — BASIC METABOLIC PANEL
Anion gap: 7 (ref 5–15)
BUN: 18 mg/dL (ref 6–23)
CO2: 26 mmol/L (ref 19–32)
Calcium: 9.4 mg/dL (ref 8.4–10.5)
Chloride: 101 mmol/L (ref 96–112)
Creatinine, Ser: 0.86 mg/dL (ref 0.50–1.35)
GFR calc Af Amer: >90 mL/min (ref >90)
GFR calc non Af Amer: >90 mL/min (ref >90)
Glucose, Bld: 228 mg/dL — ABNORMAL HIGH (ref 70–99)
Potassium: 4.3 mmol/L (ref 3.5–5.1)
Sodium: 134 mmol/L — ABNORMAL LOW (ref 135–145)

## 2014-07-30 LAB — HEPATIC FUNCTION PANEL
ALT: 27 U/L (ref 0–53)
AST: 22 U/L (ref 0–37)
Albumin: 3.9 g/dL (ref 3.5–5.2)
Alkaline Phosphatase: 67 U/L (ref 39–117)
Bilirubin, Direct: 0.1 mg/dL (ref 0.0–0.5)
Indirect Bilirubin: 0.5 mg/dL (ref 0.3–0.9)
Total Bilirubin: 0.6 mg/dL (ref 0.3–1.2)
Total Protein: 7.3 g/dL (ref 6.0–8.3)

## 2014-07-30 LAB — CBG MONITORING, ED: Glucose-Capillary: 207 mg/dL — ABNORMAL HIGH (ref 70–99)

## 2014-07-30 LAB — GLUCOSE, CAPILLARY
Glucose-Capillary: 158 mg/dL — ABNORMAL HIGH (ref 70–99)
Glucose-Capillary: 89 mg/dL (ref 70–99)

## 2014-07-30 LAB — LIPASE, BLOOD: Lipase: 25 U/L (ref 11–59)

## 2014-07-30 MED ORDER — GABAPENTIN 300 MG PO CAPS
300.0000 mg | ORAL_CAPSULE | Freq: Every day | ORAL | Status: DC
  Administered 2014-07-30: 300 mg via ORAL
  Filled 2014-07-30: qty 1

## 2014-07-30 MED ORDER — NITROGLYCERIN 0.4 MG SL SUBL: 0.4000 mg | SUBLINGUAL_TABLET | SUBLINGUAL | Status: DC | PRN

## 2014-07-30 MED ORDER — ACETAMINOPHEN 325 MG PO TABS: 650.0000 mg | ORAL_TABLET | ORAL | Status: DC | PRN

## 2014-07-30 MED ORDER — HEPARIN SODIUM (PORCINE) 5000 UNIT/ML IJ SOLN
5000.0000 [IU] | Freq: Three times a day (TID) | INTRAMUSCULAR | Status: DC
  Administered 2014-07-30 – 2014-07-31 (×3): 5000 [IU] via SUBCUTANEOUS
  Filled 2014-07-30 (×3): qty 1

## 2014-07-30 MED ORDER — LISINOPRIL 5 MG PO TABS
5.0000 mg | ORAL_TABLET | Freq: Every day | ORAL | Status: DC
  Administered 2014-07-30 – 2014-07-31 (×2): 5 mg via ORAL
  Filled 2014-07-30 (×2): qty 1

## 2014-07-30 MED ORDER — INSULIN ASPART 100 UNIT/ML ~~LOC~~ SOLN
0.0000 [IU] | Freq: Three times a day (TID) | SUBCUTANEOUS | Status: DC
  Administered 2014-07-30: 4 [IU] via SUBCUTANEOUS

## 2014-07-30 MED ORDER — ONDANSETRON HCL 4 MG/2ML IJ SOLN
4.0000 mg | Freq: Four times a day (QID) | INTRAMUSCULAR | Status: DC | PRN
  Administered 2014-07-30: 4 mg via INTRAVENOUS
  Filled 2014-07-30: qty 2

## 2014-07-30 MED ORDER — METOPROLOL TARTRATE 25 MG PO TABS
25.0000 mg | ORAL_TABLET | Freq: Two times a day (BID) | ORAL | Status: DC
  Administered 2014-07-30 (×2): 25 mg via ORAL
  Filled 2014-07-30 (×2): qty 1

## 2014-07-30 MED ORDER — INSULIN ASPART 100 UNIT/ML ~~LOC~~ SOLN: 0.0000 [IU] | Freq: Every day | SUBCUTANEOUS | Status: DC

## 2014-07-30 MED ORDER — INSULIN ASPART PROT & ASPART (70-30 MIX) 100 UNIT/ML ~~LOC~~ SUSP
60.0000 [IU] | Freq: Two times a day (BID) | SUBCUTANEOUS | Status: DC
  Administered 2014-07-30 – 2014-07-31 (×3): 60 [IU] via SUBCUTANEOUS
  Filled 2014-07-30: qty 10

## 2014-07-30 MED ORDER — ASPIRIN 81 MG PO CHEW
324.0000 mg | CHEWABLE_TABLET | Freq: Once | ORAL | Status: AC
  Administered 2014-07-30: 324 mg via ORAL
  Filled 2014-07-30: qty 4

## 2014-07-30 MED ORDER — PANTOPRAZOLE SODIUM 40 MG PO TBEC
40.0000 mg | DELAYED_RELEASE_TABLET | Freq: Every day | ORAL | Status: DC
  Administered 2014-07-31: 40 mg via ORAL
  Filled 2014-07-30: qty 1

## 2014-07-30 MED ORDER — PANTOPRAZOLE SODIUM 40 MG IV SOLR
40.0000 mg | Freq: Once | INTRAVENOUS | Status: AC
  Administered 2014-07-30: 40 mg via INTRAVENOUS
  Filled 2014-07-30: qty 40

## 2014-07-30 MED ORDER — LINAGLIPTIN 5 MG PO TABS
5.0000 mg | ORAL_TABLET | Freq: Every day | ORAL | Status: DC
  Administered 2014-07-30 – 2014-07-31 (×2): 5 mg via ORAL
  Filled 2014-07-30 (×2): qty 1

## 2014-07-30 MED ORDER — ASPIRIN EC 81 MG PO TBEC
81.0000 mg | DELAYED_RELEASE_TABLET | Freq: Every day | ORAL | Status: DC
  Administered 2014-07-31: 81 mg via ORAL
  Filled 2014-07-30: qty 1

## 2014-07-30 MED ORDER — PRAVASTATIN SODIUM 40 MG PO TABS
40.0000 mg | ORAL_TABLET | Freq: Every day | ORAL | Status: DC
  Administered 2014-07-30 – 2014-07-31 (×2): 40 mg via ORAL
  Filled 2014-07-30 (×2): qty 1

## 2014-07-30 NOTE — H&P (Signed)
[facility] History and Physical  [name] [name] BJS:[id number] DOB: [birth year] DOA: 07/30/2014  Referring physician: ER PCP: Redge Gainer, MD   Chief Complaint: Chest pain  HPI: [name] [name] is a 64 y.o. male  This is a 64 year old man, hypertensive, diabetic, smoker and previous history of coronary artery disease, who presents with intermittent sharp chest pain which last started at 9 PM yesterday. He went to sleep and woke up this morning again with the chest pain, similar to yesterday. The initial pain  yesterday lasted approximately one hour. It was associated with nausea but no significant sweating or dyspnea. Today was similar. He did possibly to send when he did this, the pain is eased off. He wonders whether he has simply gas or reflux disease. He does have a history of coronary artery disease that was treated with stenting procedure approximately 10 years ago. He is now being admitted for further investigation.   Review of Systems:  Apart from symptoms above, all systems negative.  Past Medical History  Diagnosis Date  . Diabetes mellitus   . Hypertension   . GERD (gastroesophageal reflux disease)   . Arthritis   . CAD (coronary artery disease)     2010  LAD 50%, RCA 100%.  DES to RCA.  EF 55%  . AODM   . Disorders of iron metabolism   . MURMUR   . CHEST PAIN-UNSPECIFIED   . DISORDERS OF IRON METABOLISM   . Medically noncompliant   . Cellulitis and abscess rt groin   . Hyperlipidemia    Past Surgical History  Procedure Laterality Date  . Coronary stent placement  2000    By Dr. Olevia Perches  . Hernia repair  2000  . Back surgery  2015    ACDF by Dr. [name]  . Lesion removal      Lip and hand    Social History:  reports that he has been smoking Cigarettes.  He has a 5.1 pack-year smoking history. He has never used smokeless tobacco. He reports that he does not drink alcohol or use illicit drugs.  Allergies  Allergen Reactions  . Ace Inhibitors Other (See Comments)  and Cough    CKD, renal failure   . Invokana [Canagliflozin] Other (See Comments)    Syncope / dehydration  . Metformin And Related Itching  . Horse-Derived Products     REACTION: Rash  . Crestor [Rosuvastatin] Other (See Comments)    Myalgias   . Lipitor [Atorvastatin] Other (See Comments)    myalgias    Family History  Problem Relation Age of Onset  . Diabetes Father   . Valvular heart disease Father   . Alzheimer's disease Mother     Prior to Admission medications   Medication Sig Start Date End Date Taking? Authorizing Provider  aspirin EC 81 MG tablet Take 81 mg by mouth daily.   Yes Historical Provider, MD  gabapentin (NEURONTIN) 300 MG capsule Take 1 capsule (300 mg total) by mouth 3 (three) times daily. Patient taking differently: Take 300 mg by mouth at bedtime.  04/06/14  Yes [name], FNP  insulin NPH-regular Human (NOVOLIN 70/30 RELION) (70-30) 100 UNIT/ML injection Inject 75 units twice a day Patient taking differently: Inject 60 Units into the skin 2 (two) times daily with a meal. Inject 60 units twice a day 03/08/14  Yes Tammy Eckard, PHARMD  lisinopril (PRINIVIL,ZESTRIL) 5 MG tablet Take 1 tablet (5 mg total) by mouth daily. 04/06/14  Yes [name], FNP  metoprolol tartrate (LOPRESSOR) 25 MG tablet Take 1 tablet (25 mg total) by mouth 2 (two) times daily. 06/22/14 03/31/16 Yes Wardell Honour, MD  Naproxen Sodium (ALEVE PO) Take 2 tablets by mouth daily.   Yes Historical Provider, MD  omeprazole (PRILOSEC) 20 MG capsule Take 1 capsule (20 mg total) by mouth daily. 10/11/13 09/24/15 Yes Lodema Pilot, PA-C  pravastatin (PRAVACHOL) 40 MG tablet Take 1 tablet daily 04/06/14  Yes [name], FNP  sitaGLIPtin (JANUVIA) 100 MG tablet Take 1 tablet (100 mg total) by mouth daily. 07/20/14  Yes Tammy Eckard, PHARMD  carbidopa-levodopa (SINEMET CR) 50-200 MG per tablet Take 1 tablet by mouth 2 (two) times daily. Patient not taking: Reported on 07/30/2014  04/06/14   [name], FNP  HYDROcodone-homatropine [facility]) 5-1.5 MG/5ML syrup Take 5 mLs by mouth every 8 (eight) hours as needed for cough. Patient not taking: Reported on 07/30/2014 04/18/14   [name], FNP  levofloxacin (LEVAQUIN) 500 MG tablet Take 1 tablet (500 mg total) by mouth daily. Patient not taking: Reported on 07/30/2014 04/18/14   [name], FNP  meloxicam (MOBIC) 15 MG tablet Take 1 tablet (15 mg total) by mouth daily. Patient not taking: Reported on 07/30/2014 04/06/14   [name], FNP   Physical Exam: Filed Vitals:   07/30/14 1200 07/30/14 1230 07/30/14 1243 07/30/14 1401  BP: 183/86 166/79 166/79 174/86  Pulse: 65 59 62 60  Temp:      Resp: 16  18 18   Height:      Weight:      SpO2: 100% 98% 98% 99%    Wt Readings from Last 3 Encounters:  07/30/14 114.306 kg (252 lb)  05/21/14 121.11 kg (267 lb)  04/18/14 123.288 kg (271 lb 12.8 oz)    General:  Appears calm and comfortable. He does not appear to be in pain. Eyes: PERRL, normal lids, irises & conjunctiva ENT: grossly normal hearing, lips & tongue Neck: no LAD, masses or thyromegaly Cardiovascular: RRR, no m/r/g. No LE edema. Telemetry: SR, no arrhythmias  Respiratory: CTA bilaterally, no w/r/r. Normal respiratory effort. Abdomen: soft, ntnd Skin: no rash or induration seen on limited exam Musculoskeletal: He does appear to have point tenderness at the lower end of the sternum, somewhat reproducing his pain that he described initially. Psychiatric: grossly normal mood and affect, speech fluent and appropriate Neurologic: grossly non-focal.          Labs on Admission:  Basic Metabolic Panel:  Recent Labs Lab 07/30/14 1000  NA 134*  K 4.3  CL 101  CO2 26  GLUCOSE 228*  BUN 18  CREATININE 0.86  CALCIUM 9.4   Liver Function Tests:  Recent Labs Lab 07/30/14 1000  AST 22  ALT 27  ALKPHOS 67  BILITOT 0.6  PROT 7.3  ALBUMIN 3.9    Recent Labs Lab 07/30/14 1000    LIPASE 25   No results for input(s): AMMONIA in the last 168 hours. CBC:  Recent Labs Lab 07/30/14 1000  WBC 9.6  NEUTROABS 6.6  HGB 16.4  HCT 46.8  MCV 84.6  PLT 209   Cardiac Enzymes:  Recent Labs Lab 07/30/14 1000  TROPONINI <0.03    BNP (last 3 results) No results for input(s): BNP in the last 8760 hours.  ProBNP (last 3 results)  Recent Labs  08/07/13 2052  PROBNP 43.4    CBG:  Recent Labs Lab 07/30/14 1004  GLUCAP 207*    Radiological Exams  on Admission: Dg Chest 2 View  07/30/2014   CLINICAL DATA:  Mid chest pain intermittently since last night.  EXAM: CHEST  2 VIEW  COMPARISON:  02/05/2014  FINDINGS: The cardiomediastinal silhouette is within normal limits. The lungs are well inflated and clear. There is no evidence of pleural effusion or pneumothorax. No acute osseous abnormality is identified.  IMPRESSION: No active cardiopulmonary disease.   Electronically Signed   By: Logan Bores   On: 07/30/2014 11:17    EKG: Independently reviewed. Normal sinus rhythm, T-wave inversions laterally. No acute ST-T wave changes.  Assessment/Plan   1. Chest pain. Overall, I think this is probably going to turn out to be noncardiac but he certainly has multiple risk factors and he wants further investigation. We will cycle serial cardiac enzymes. We will ask cardiology to consult on him. He may need repeat stress testing. 2. Hypertension, stable. 3. Diabetes. Continue with home medications with sliding scale of insulin. 4. Ongoing tobacco abuse. He used to smoke 3 packs of cigarettes a day, now he smokes 1 pack every 3 days, this is an improvement. He was counseled regarding cessation completely.  Further recommendations will depend on patient's hospital progress.   Code Status: Full code.   DVT Prophylaxis: Heparin.  Family Communication: I discussed the plan with the patient at the bedside.   Disposition Plan: Home when medically stable.   Time spent: 45  minutes.  Doree Albee [facility] Pager [phone].

## 2014-07-30 NOTE — Consult Note (Signed)
Primary cardiologist: Dr Jenkins Rouge MD Consulting cardiologist: Dr Carlyle Dolly MD  Clinical Summary [name] [name] is a 64 y.o.male hx of CAD with history of coronary stenting stenting (DES to RCA in RCA CTO 2010, , DM2, HTN, HL, GERD admitted with chest pain. Admit 07/2013 with chest pain to [facility], lexiscan MPI at that time low risk and managed medically.   Reports starting last night around 830 pm the onset of 5/10 sharp chest pain in midchest. No other associated symptoms. Would last approx 30-45 minutes, then go and come back. Was able to go to sleep, however it awoke him from sleep around 430AM. Not positional. Reports symptoms started approx 30 minutes after dinner. He had a [employer] chicken wrap, fish sandwich, and oatmeal cookie. Some relief today with maalox and passing gas. Reports intermittent sharp abdominal pain on and off for the last few weeks.     10/2011 Echo: LVEF 50-55%, no WMAs, grade I diastolic dysfunction 06/5850 Lexiscan MPI: inferior scar, minimal peri-infarct ischemia 10/2011 Lexiscan MPI: partially reversible inferior defect, low risk 2010 cath LM patent, LAD 50%, LCX patent, RCA CTO. Received stent to RCA Trop neg x 1, Hgb 164, Plt 209, Cr 0.86, K 4.3,  CXR no acute process EKG SR, LAE, chronic TWI lateral precordial leads   Allergies  Allergen Reactions  . Ace Inhibitors Other (See Comments) and Cough    CKD, renal failure   . Invokana [Canagliflozin] Other (See Comments)    Syncope / dehydration  . Metformin And Related Itching  . Horse-Derived Products     REACTION: Rash  . Crestor [Rosuvastatin] Other (See Comments)    Myalgias   . Lipitor [Atorvastatin] Other (See Comments)    myalgias    Medications Scheduled Medications: . aspirin EC  81 mg Oral Daily  . gabapentin  300 mg Oral QHS  . heparin  5,000 Units Subcutaneous 3 times per day  . insulin aspart  0-20 Units Subcutaneous TID WC  . insulin aspart  0-5 Units Subcutaneous QHS  .  insulin aspart protamine- aspart  60 Units Subcutaneous BID WC  . linagliptin  5 mg Oral Daily  . lisinopril  5 mg Oral Daily  . metoprolol tartrate  25 mg Oral BID  . pantoprazole  40 mg Oral Daily  . pravastatin  40 mg Oral q1800     Infusions:     PRN Medications:  acetaminophen, nitroGLYCERIN, ondansetron (ZOFRAN) IV   Past Medical History  Diagnosis Date  . Diabetes mellitus   . Hypertension   . GERD (gastroesophageal reflux disease)   . Arthritis   . CAD (coronary artery disease)     2010  LAD 50%, RCA 100%.  DES to RCA.  EF 55%  . AODM   . Disorders of iron metabolism   . MURMUR   . CHEST PAIN-UNSPECIFIED   . DISORDERS OF IRON METABOLISM   . Medically noncompliant   . Cellulitis and abscess rt groin   . Hyperlipidemia     Past Surgical History  Procedure Laterality Date  . Coronary stent placement  2000    By Dr. Olevia Perches  . Hernia repair  2000  . Back surgery  2015    ACDF by Dr. [name]  . Lesion removal      Lip and hand     Family History  Problem Relation Age of Onset  . Diabetes Father   . Valvular heart disease Father   . Alzheimer's disease Mother  Social History Mr. Gassett reports that he has been smoking Cigarettes.  He has a 5.1 pack-year smoking history. He has never used smokeless tobacco. Mr. Casten reports that he does not drink alcohol.  Review of Systems CONSTITUTIONAL: No weight loss, fever, chills, weakness or fatigue.  HEENT: Eyes: No visual loss, blurred vision, double vision or yellow sclerae. No hearing loss, sneezing, congestion, runny nose or sore throat.  SKIN: No rash or itching.  CARDIOVASCULAR:per HPI RESPIRATORY: No shortness of breath, cough or sputum.  GASTROINTESTINAL: per HPI  GENITOURINARY: no polyuria, no dysuria NEUROLOGICAL: No headache, dizziness, syncope, paralysis, ataxia, numbness or tingling in the extremities. No change in bowel or bladder control.  MUSCULOSKELETAL: No muscle, back pain, joint pain or  stiffness.  HEMATOLOGIC: No anemia, bleeding or bruising.  LYMPHATICS: No enlarged nodes. No history of splenectomy.  PSYCHIATRIC: No history of depression or anxiety.      Physical Examination Blood pressure 174/86, pulse 60, temperature 97.4 F (36.3 C), resp. rate 18, height 6\' 2"  (1.88 m), weight 252 lb (114.306 kg), SpO2 99 %. No intake or output data in the 24 hours ending 07/30/14 1443  HEENT: sclera clear  Cardiovascular: RRR, no m/r/g, no JVD  Respiratory: CTAB  GI: abdomen soft. Mild tenderness to palpation  MSK: epigastric and right chest tender to palpation  Neuro: no focal deficits  Psych: appropriate affect   Lab Results  Basic Metabolic Panel:  Recent Labs Lab 07/30/14 1000  NA 134*  K 4.3  CL 101  CO2 26  GLUCOSE 228*  BUN 18  CREATININE 0.86  CALCIUM 9.4    Liver Function Tests:  Recent Labs Lab 07/30/14 1000  AST 22  ALT 27  ALKPHOS 67  BILITOT 0.6  PROT 7.3  ALBUMIN 3.9    CBC:  Recent Labs Lab 07/30/14 1000  WBC 9.6  NEUTROABS 6.6  HGB 16.4  HCT 46.8  MCV 84.6  PLT 209    Cardiac Enzymes:  Recent Labs Lab 07/30/14 1000  TROPONINI <0.03    BNP: Invalid input(s): POCBNP    Impression/Recommendations  1. Chest pain - atypical for cardiac cause. Somewhat better with maalox and passing gas. Also some tenderness to palpation of the epigastric area and right chest. No objective evidence of ischemia by EKG or enzymes.  - given history recommend admission and cycling on EKG and cardiac enzymes. Will obtain echo. NPO at midnight incase cardiac testing is needed.  - potential GI etiology workup per primary team.     Carlyle Dolly, M.D.

## 2014-07-30 NOTE — ED Provider Notes (Signed)
CSN: [id number]     Arrival date & time 07/30/14  2956 History  This chart was scribed for [name] Albee, MD by Edison Simon, ED Scribe. This patient was seen in room APA11/APA11 and the patient's care was started at 10:37 AM.    Chief Complaint  Patient presents with  . Chest Pain   The history is provided by the patient and the spouse. No language interpreter was used.    HPI Comments: [name] [name] is a 64 y.o. male who presents to the Emergency Department complaining of intermittent chest pain with onset at 2200 last night while sitting on couch and watching television. He denies any pain now, but states he woke with pain and has had it while at ED. He describes it as sharp. He states he has not had similar pain before. He notes he has cardiac stent but states the pain was in his back at that time. He reports benign stress test at [facility]. He has not used ASA today. He states his stomach is "in knots" his abdomen and has had gas. Wife notes he is sweaty at baseline. He has history of DM and smoking. He denies SOB, vomiting, or diarrhea.  Past Medical History  Diagnosis Date  . Diabetes mellitus   . Hypertension   . GERD (gastroesophageal reflux disease)   . Arthritis   . CAD (coronary artery disease)     2010  LAD 50%, RCA 100%.  DES to RCA.  EF 55%  . AODM   . Disorders of iron metabolism   . MURMUR   . CHEST PAIN-UNSPECIFIED   . DISORDERS OF IRON METABOLISM   . Medically noncompliant   . Cellulitis and abscess rt groin   . Hyperlipidemia    Past Surgical History  Procedure Laterality Date  . Coronary stent placement  2000    By Dr. Olevia Perches  . Hernia repair  2000  . Back surgery  2015    ACDF by Dr. [name]  . Lesion removal      Lip and hand    Family History  Problem Relation Age of Onset  . Diabetes Father   . Valvular heart disease Father   . Alzheimer's disease Mother    History  Substance Use Topics  . Smoking status: Current Some Day Smoker -- 0.10 packs/day  for 51 years    Types: Cigarettes  . Smokeless tobacco: Never Used  . Alcohol Use: No    Review of Systems  Constitutional: Negative.  Negative for fever.  Respiratory: Positive for shortness of breath. Negative for chest tightness.   Cardiovascular: Positive for chest pain.  Gastrointestinal: Negative.  Negative for vomiting and abdominal pain.       "gas"  Genitourinary: Negative.  Negative for dysuria.  Musculoskeletal: Negative for back pain.  Neurological: Negative for headaches.  All other systems reviewed and are negative.     Allergies  Ace inhibitors; Invokana; Metformin and related; Horse-derived products; Crestor; and Lipitor  Home Medications   Prior to Admission medications   Medication Sig Start Date End Date Taking? Authorizing Provider  aspirin EC 81 MG tablet Take 81 mg by mouth daily.    Historical Provider, MD  carbidopa-levodopa (SINEMET CR) 50-200 MG per tablet Take 1 tablet by mouth 2 (two) times daily. 04/06/14   [name], FNP  gabapentin (NEURONTIN) 300 MG capsule Take 1 capsule (300 mg total) by mouth 3 (three) times daily. 04/06/14   [name], FNP  HYDROcodone-homatropine (HYCODAN) 5-1.5 MG/5ML syrup Take 5 mLs by mouth every 8 (eight) hours as needed for cough. 04/18/14   [name], FNP  insulin NPH-regular Human (NOVOLIN 70/30 RELION) (70-30) 100 UNIT/ML injection Inject 75 units twice a day 03/08/14   Tammy Eckard, PHARMD  levofloxacin (LEVAQUIN) 500 MG tablet Take 1 tablet (500 mg total) by mouth daily. 04/18/14   [name], FNP  lisinopril (PRINIVIL,ZESTRIL) 5 MG tablet Take 1 tablet (5 mg total) by mouth daily. 04/06/14   [name], FNP  meloxicam (MOBIC) 15 MG tablet Take 1 tablet (15 mg total) by mouth daily. 04/06/14   [name], FNP  metoprolol tartrate (LOPRESSOR) 25 MG tablet Take 1 tablet (25 mg total) by mouth 2 (two) times daily. 06/22/14 03/31/16  Wardell Honour, MD  omeprazole (PRILOSEC) 20 MG  capsule Take 1 capsule (20 mg total) by mouth daily. 10/11/13 09/24/15  Lodema Pilot, PA-C  omeprazole (PRILOSEC) 20 MG capsule TAKE ONE CAPSULE BY MOUTH ONCE DAILY 05/09/14   Chipper Herb, MD  pravastatin (PRAVACHOL) 40 MG tablet Take 1 tablet daily 04/06/14   [name], FNP  sitaGLIPtin (JANUVIA) 100 MG tablet Take 1 tablet (100 mg total) by mouth daily. 07/20/14   Tammy Eckard, PHARMD   BP 166/79 mmHg  Pulse 62  Temp(Src) 97.4 F (36.3 C)  Resp 18  Ht 6\' 2"  (1.88 m)  Wt 252 lb (114.306 kg)  BMI 32.34 kg/m2  SpO2 98% Physical Exam  Constitutional: He is oriented to person, place, and time. No distress.  obese  HENT:  Head: Normocephalic and atraumatic.  Eyes: Pupils are equal, round, and reactive to light.  Neck: Neck supple.  Cardiovascular: Normal rate, regular rhythm and normal heart sounds.   No murmur heard. Pulmonary/Chest: Effort normal and breath sounds normal. No respiratory distress. He has no wheezes.  Abdominal: Soft. Bowel sounds are normal. There is tenderness. There is no rebound and no guarding.  Mild ttp of the epigastrium  Musculoskeletal: He exhibits edema.  Neurological: He is alert and oriented to person, place, and time.  Skin: Skin is warm and dry.  Psychiatric: He has a normal mood and affect.  Nursing note and vitals reviewed.   ED Course  Procedures (including critical care time)  DIAGNOSTIC STUDIES: Oxygen Saturation is 99% on room air, normal by my interpretation.    COORDINATION OF CARE: 10:41 AM Discussed treatment plan with patient at beside, the patient agrees with the plan and has no further questions at this time.   Labs Review Labs Reviewed  BASIC METABOLIC PANEL - Abnormal; Notable for the following:    Sodium 134 (*)    Glucose, Bld 228 (*)    All other components within normal limits  CBG MONITORING, ED - Abnormal; Notable for the following:    Glucose-Capillary 207 (*)    All other components within normal limits   CBC WITH DIFFERENTIAL/PLATELET  TROPONIN I  HEPATIC FUNCTION PANEL  LIPASE, BLOOD    Imaging Review Dg Chest 2 View  07/30/2014   CLINICAL DATA:  Mid chest pain intermittently since last night.  EXAM: CHEST  2 VIEW  COMPARISON:  02/05/2014  FINDINGS: The cardiomediastinal silhouette is within normal limits. The lungs are well inflated and clear. There is no evidence of pleural effusion or pneumothorax. No acute osseous abnormality is identified.  IMPRESSION: No active cardiopulmonary disease.   Electronically Signed   By: Logan Bores   On: 07/30/2014  11:17     EKG Interpretation   Date/Time:  Monday July 30 2014 09:54:04 EDT Ventricular Rate:  65 PR Interval:  164 QRS Duration: 98 QT Interval:  392 QTC Calculation: 408 R Axis:   81 Text Interpretation:  Sinus rhythm Borderline right axis deviation RSR' in  V1 or V2, probably normal variant Nonspecific T abnormalities, lateral  leads Baseline wander in lead(s) I III aVL No significant change since  last tracing Confirmed by Ymani Porcher  MD, [place] ([id number]) on 07/30/2014  10:12:40 AM      MDM   Final diagnoses:  Other chest pain   Patient presents for chest pain. Currently chest pain-free. EKG is nonischemic at this time. Chest x-Jobani reassuring. Patient has a history of coronary artery disease and his last stress test was 1 year ago that showed prior infarction as well as 40% EF. Patient is currently pain-free. Patient was given aspirin.  11:51 AM  On recheck, patient states that he has had several episodes of seconds of pain while in the ER. He is currently pain-free again. He also endorses increased gassiness and that the pain sometimes goes away after he belches. History of "gallbladder attack."  States the pain does not get worse with food however. Patient will be given PPI and liver function tests and lipase added. Discussed with patient likelihood for admission for serial enzymes.  Additional lab work reassuring. No  elevated LFTs. Given the pain is not worsened with eating, have low suspicion at this time that his gallbladder is etiology. Patient was given IV Protonix given that his symptoms may be related to GERD. However, this does not preclude him from being admitted for serial enzymes given his extensive risk factors and history.  Will admit to Dr. Anastasio Champion.  Also discussed with Dr. Harl Bowie, cardiology who will evaluate the patient.  I personally performed the services described in this documentation, which was scribed in my presence. The recorded information has been reviewed and is accurate.   Merryl Hacker, MD 07/30/14 1351

## 2014-07-30 NOTE — ED Notes (Signed)
Chest pain since last night on and off.  Rates pain 4.  Pain is to upper mid chest.  Non-radiating.

## 2014-07-31 DIAGNOSIS — R079 Chest pain, unspecified: Secondary | ICD-10-CM | POA: Diagnosis not present

## 2014-07-31 DIAGNOSIS — Z9861 Coronary angioplasty status: Secondary | ICD-10-CM | POA: Diagnosis not present

## 2014-07-31 DIAGNOSIS — I2510 Atherosclerotic heart disease of native coronary artery without angina pectoris: Secondary | ICD-10-CM | POA: Diagnosis not present

## 2014-07-31 LAB — LIPID PANEL
Cholesterol: 178 mg/dL (ref 0–200)
HDL: 26 mg/dL — ABNORMAL LOW (ref >39)
LDL Cholesterol: UNDETERMINED mg/dL (ref 0–99)
Total CHOL/HDL Ratio: 6.8 RATIO
Triglycerides: 511 mg/dL — ABNORMAL HIGH (ref <150)
VLDL: UNDETERMINED mg/dL (ref 0–40)

## 2014-07-31 LAB — GLUCOSE, CAPILLARY
Glucose-Capillary: 231 mg/dL — ABNORMAL HIGH (ref 70–99)
Glucose-Capillary: 189 mg/dL — ABNORMAL HIGH (ref 70–99)
Glucose-Capillary: 153 mg/dL — ABNORMAL HIGH (ref 70–99)

## 2014-07-31 MED ORDER — ISOSORBIDE MONONITRATE ER 30 MG PO TB24: 15.0000 mg | ORAL_TABLET | Freq: Every day | ORAL | Status: DC

## 2014-07-31 MED ORDER — ISOSORBIDE MONONITRATE ER 30 MG PO TB24
15.0000 mg | ORAL_TABLET | Freq: Every day | ORAL | Status: DC
  Administered 2014-07-31: 15 mg via ORAL
  Filled 2014-07-31: qty 1

## 2014-07-31 MED ORDER — METOPROLOL TARTRATE 25 MG PO TABS
12.5000 mg | ORAL_TABLET | Freq: Two times a day (BID) | ORAL | Status: DC
  Administered 2014-07-31: 12.5 mg via ORAL
  Filled 2014-07-31: qty 1

## 2014-07-31 NOTE — Progress Notes (Signed)
UR completed 

## 2014-07-31 NOTE — Discharge Summary (Signed)
Physician Discharge Summary  [name] [name] HFW:[id number] DOB: 1950-07-09 DOA: 07/30/2014  PCP: Redge Gainer, MD  Admit date: 07/30/2014 Discharge date: 07/31/2014  Time spent: 45 minutes  Recommendations for Outpatient Follow-up:  -Will be discharged home today. -Has follow up scheduled with cardiology on 3/31.   Discharge Diagnoses:  Active Problems:   Essential hypertension, benign   Type 2 diabetes mellitus with complications   CAD S/P percutaneous coronary angioplasty   Chest pain   Obesity (BMI 30.0-34.9)   Discharge Condition: Stable and improbed  Filed Weights   07/30/14 0954  Weight: 114.306 kg (252 lb)    History of present illness:  This is a 64 year old man, hypertensive, diabetic, smoker and previous history of coronary artery disease, who presents with intermittent sharp chest pain which last started at 9 PM yesterday. He went to sleep and woke up this morning again with the chest pain, similar to yesterday. The initial pain yesterday lasted approximately one hour. It was associated with nausea but no significant sweating or dyspnea. Today was similar. He did possibly to send when he did this, the pain is eased off. He wonders whether he has simply gas or reflux disease. He does have a history of coronary artery disease that was treated with stenting procedure approximately 10 years ago. He is now being admitted for further investigation.  Hospital Course:   Chest Pain -He notices it is more severe with eating certain foods and soda. -Feels better after receiving an antacid and belching. -No objective evidence of ischemia by troponins or EKG. -ECHO:  EF 50-55%, no WMA. -Ok to Dc home. -No further cardiac work up planned for at present. -Patient is already on a PPI, H2 blocker and OTC antacids.  Procedures:  None   Consultations:  Cardiology, Dr. Harl Bowie  Discharge Instructions  Discharge Instructions    Diet - low sodium heart healthy    Complete  by:  As directed      Increase activity slowly    Complete by:  As directed             Medication List    STOP taking these medications        ALEVE PO     carbidopa-levodopa 50-200 MG per tablet  Commonly known as:  SINEMET CR     HYDROcodone-homatropine 5-1.5 MG/5ML syrup  Commonly known as:  HYCODAN     levofloxacin 500 MG tablet  Commonly known as:  LEVAQUIN     meloxicam 15 MG tablet  Commonly known as:  MOBIC      TAKE these medications        aspirin EC 81 MG tablet  Take 81 mg by mouth daily.     gabapentin 300 MG capsule  Commonly known as:  NEURONTIN  Take 1 capsule (300 mg total) by mouth 3 (three) times daily.     insulin NPH-regular Human (70-30) 100 UNIT/ML injection  Commonly known as:  NOVOLIN 70/30 RELION  Inject 75 units twice a day     isosorbide mononitrate 30 MG 24 hr tablet  Commonly known as:  IMDUR  Take 0.5 tablets (15 mg total) by mouth daily.     lisinopril 5 MG tablet  Commonly known as:  PRINIVIL,ZESTRIL  Take 1 tablet (5 mg total) by mouth daily.     metoprolol tartrate 25 MG tablet  Commonly known as:  LOPRESSOR  Take 1 tablet (25 mg total) by mouth 2 (two) times daily.  omeprazole 20 MG capsule  Commonly known as:  PRILOSEC  Take 1 capsule (20 mg total) by mouth daily.     pravastatin 40 MG tablet  Commonly known as:  PRAVACHOL  Take 1 tablet daily     sitaGLIPtin 100 MG tablet  Commonly known as:  JANUVIA  Take 1 tablet (100 mg total) by mouth daily.       Allergies  Allergen Reactions  . Ace Inhibitors Other (See Comments) and Cough    CKD, renal failure   . Invokana [Canagliflozin] Other (See Comments)    Syncope / dehydration  . Metformin And Related Itching  . Horse-Derived Products     REACTION: Rash  . Crestor [Rosuvastatin] Other (See Comments)    Myalgias   . Lipitor [Atorvastatin] Other (See Comments)    myalgias       Follow-up Information    Follow up with Jory Sims, NP On  08/16/2014.   Specialty:  Nurse Practitioner   Why:  at 1:50 pm   Contact information:   [address] [place] [phone]        The results of significant diagnostics from this hospitalization (including imaging, microbiology, ancillary and laboratory) are listed below for reference.    Significant Diagnostic Studies: Dg Chest 2 View  07/30/2014   CLINICAL DATA:  Mid chest pain intermittently since last night.  EXAM: CHEST  2 VIEW  COMPARISON:  02/05/2014  FINDINGS: The cardiomediastinal silhouette is within normal limits. The lungs are well inflated and clear. There is no evidence of pleural effusion or pneumothorax. No acute osseous abnormality is identified.  IMPRESSION: No active cardiopulmonary disease.   Electronically Signed   By: Logan Bores   On: 07/30/2014 11:17    Microbiology: No results found for this or any previous visit (from the past 240 hour(s)).   Labs: Basic Metabolic Panel:  Recent Labs Lab 07/30/14 1000  NA 134*  K 4.3  CL 101  CO2 26  GLUCOSE 228*  BUN 18  CREATININE 0.86  CALCIUM 9.4   Liver Function Tests:  Recent Labs Lab 07/30/14 1000  AST 22  ALT 27  ALKPHOS 67  BILITOT 0.6  PROT 7.3  ALBUMIN 3.9    Recent Labs Lab 07/30/14 1000  LIPASE 25   No results for input(s): AMMONIA in the last 168 hours. CBC:  Recent Labs Lab 07/30/14 1000  WBC 9.6  NEUTROABS 6.6  HGB 16.4  HCT 46.8  MCV 84.6  PLT 209   Cardiac Enzymes:  Recent Labs Lab 07/30/14 1000 07/30/14 1435 07/30/14 1701 07/30/14 2020  TROPONINI <0.03 <0.03 <0.03 <0.03   BNP: BNP (last 3 results) No results for input(s): BNP in the last 8760 hours.  ProBNP (last 3 results)  Recent Labs  08/07/13 2052  PROBNP 43.4    CBG:  Recent Labs Lab 07/30/14 1636 07/30/14 2037 07/31/14 0738 07/31/14 1127 07/31/14 1642  GLUCAP 158* 89 231* 189* 153*       Signed:  [facility] Pager:  [phone] 07/31/2014, 5:25 PM

## 2014-07-31 NOTE — Progress Notes (Signed)
  Echocardiogram 2D Echocardiogram has been performed.  Samuel Germany 07/31/2014, 9:15 AM

## 2014-07-31 NOTE — Progress Notes (Signed)
Echo shows normal LVEF at 50-55%, no WMAs. Overall no significant findings. Patient with atypical non-cardiac chest pain, no further workup at this time. He may f/u with NP Lawrence in 2-3 weeks. We will sign off inpatient care.   Zandra Abts MD

## 2014-07-31 NOTE — Progress Notes (Signed)
Patient ID: [name], male   DOB: [birth year], 64 y.o.   MRN: [id number]     Subjective:    No chest pain this morning.   Objective:   Temp:  [97.4 F (36.3 C)-99.2 F (37.3 C)] 98.3 F (36.8 C) (03/15 0405) Pulse Rate:  [56-66] 64 (03/15 0405) Resp:  [12-18] 18 (03/15 0405) BP: (115-192)/(55-95) 122/70 mmHg (03/15 0405) SpO2:  [97 %-100 %] 98 % (03/15 0405) Weight:  [252 lb (114.306 kg)] 252 lb (114.306 kg) (03/14 0954) Last BM Date: 07/30/14  Filed Weights   07/30/14 0954  Weight: 252 lb (114.306 kg)    Intake/Output Summary (Last 24 hours) at 07/31/14 0836 Last data filed at 07/30/14 1911  Gross per 24 hour  Intake    240 ml  Output    650 ml  Net   -410 ml    Telemetry: SR and sinus brady  Exam:  General: NAD  Resp: CTAB  Cardiac: RRR, no m/r/g, no JVD  FI:EPPIRJJ soft, NT, ND  MSK:no LE edema  Neuro: no focal deficits  Psych: appropriate affect Lab Results:  Basic Metabolic Panel:  Recent Labs Lab 07/30/14 1000  NA 134*  K 4.3  CL 101  CO2 26  GLUCOSE 228*  BUN 18  CREATININE 0.86  CALCIUM 9.4    Liver Function Tests:  Recent Labs Lab 07/30/14 1000  AST 22  ALT 27  ALKPHOS 67  BILITOT 0.6  PROT 7.3  ALBUMIN 3.9    CBC:  Recent Labs Lab 07/30/14 1000  WBC 9.6  HGB 16.4  HCT 46.8  MCV 84.6  PLT 209    Cardiac Enzymes:  Recent Labs Lab 07/30/14 1435 07/30/14 1701 07/30/14 2020  TROPONINI <0.03 <0.03 <0.03    BNP:  Recent Labs  08/07/13 2052  PROBNP 43.4    Coagulation: No results for input(s): INR in the last 168 hours.  ECG:   Medications:   Scheduled Medications: . aspirin EC  81 mg Oral Daily  . gabapentin  300 mg Oral QHS  . heparin  5,000 Units Subcutaneous 3 times per day  . insulin aspart  0-20 Units Subcutaneous TID WC  . insulin aspart  0-5 Units Subcutaneous QHS  . insulin aspart protamine- aspart  60 Units Subcutaneous BID WC  . linagliptin  5 mg Oral Daily  . lisinopril  5 mg  Oral Daily  . metoprolol tartrate  25 mg Oral BID  . pantoprazole  40 mg Oral Daily  . pravastatin  40 mg Oral q1800     Infusions:     PRN Medications:  acetaminophen, nitroGLYCERIN, ondansetron (ZOFRAN) IV     Assessment/Plan   1. Chest pain - atypical for cardiac cause. Pain better with maalox and passing gas. Also some tenderness to palpation of the epigastric area and right chest. No objective evidence of ischemia by EKG or enzymes, lateral TWIs are old and seen on prior EKGs. Symptoms resolved this AM, reports he passed gas throughout the night with improved symptoms. - with atypical symptoms and no objective evidence of ischemia will not pursue ischemic testing at this time, will f/u echo results.  - will add imdur 15mg  as additional antianginal  - some sinus brady overnight and this AM, will decrease lopressor to 12.5mg  bid.   2. HTN - at goal, continue current meds  3. HL - myalgias on high dose statins, has been on pravastatin 40mg  daily - high TGs this AM, unable to calculate LDL. HgbA1c  is pending, I suspect his DM likely is poorly controlled, f/u results (HgbA1c a year ago 10.9). First step for high TGs would be improved diet and exercise and weight loss along with aggressive DM control, would not start additional cholesterol agent at this time.         Carlyle Dolly, M.D.

## 2014-08-01 ENCOUNTER — Encounter: Payer: Self-pay | Admitting: Pharmacist

## 2014-08-01 ENCOUNTER — Ambulatory Visit (INDEPENDENT_AMBULATORY_CARE_PROVIDER_SITE_OTHER): Payer: Medicare Other | Admitting: Pharmacist

## 2014-08-01 VITALS — BP 126/78 | HR 66 | Ht 75.0 in | Wt 268.5 lb

## 2014-08-01 DIAGNOSIS — Z1211 Encounter for screening for malignant neoplasm of colon: Secondary | ICD-10-CM

## 2014-08-01 DIAGNOSIS — M79674 Pain in right toe(s): Secondary | ICD-10-CM

## 2014-08-01 DIAGNOSIS — Z794 Long term (current) use of insulin: Secondary | ICD-10-CM

## 2014-08-01 DIAGNOSIS — E1165 Type 2 diabetes mellitus with hyperglycemia: Secondary | ICD-10-CM | POA: Diagnosis not present

## 2014-08-01 DIAGNOSIS — M79609 Pain in unspecified limb: Secondary | ICD-10-CM | POA: Diagnosis not present

## 2014-08-01 DIAGNOSIS — IMO0002 Reserved for concepts with insufficient information to code with codable children: Secondary | ICD-10-CM

## 2014-08-01 DIAGNOSIS — Z0000 Encounter for general adult medical examination without abnormal findings: Secondary | ICD-10-CM

## 2014-08-01 DIAGNOSIS — E291 Testicular hypofunction: Secondary | ICD-10-CM | POA: Diagnosis not present

## 2014-08-01 DIAGNOSIS — F329 Major depressive disorder, single episode, unspecified: Secondary | ICD-10-CM

## 2014-08-01 DIAGNOSIS — F32A Depression, unspecified: Secondary | ICD-10-CM

## 2014-08-01 DIAGNOSIS — R7989 Other specified abnormal findings of blood chemistry: Secondary | ICD-10-CM

## 2014-08-01 LAB — HEMOGLOBIN A1C
Hgb A1c MFr Bld: 9.2 % — ABNORMAL HIGH (ref 4.8–5.6)
Mean Plasma Glucose: 217 mg/dL

## 2014-08-01 MED ORDER — BUPROPION HCL ER (XL) 150 MG PO TB24: 150.0000 mg | ORAL_TABLET | Freq: Every day | ORAL | Status: DC

## 2014-08-01 NOTE — Progress Notes (Signed)
Patient ID: [name] [name], male   DOB: 08-14-1950, 64 y.o.   MRN: [id number]    Subjective:   [name] [name] is a 64 y.o. male who presents for an Initial Medicare Annual Wellness Visit and uncontrolled type 2 diabetes which requires insulin to control.  Patient went to hospital 07/31/14 due to chest pain.  No cardiac etiology was determined.   A1c was found to be 9.2% which was an increase from last check 03/2014.   Patient reports home BG readings 150 to 200's in am but also states when he is more active he has has lows - which he reports as 89 and 102.   Currently taking NPH insulin 60 units BID.  About 3 months ago patient was on 75units BID but due to hypoglycemia was decreased over 1- 2 weeks to current dose.  Choice to use NPH insulin was made based on cost to patient / affordability.  Patient also states that since starting pravastatin he has had worsening back pain.  It improved some when dose was decreased from 80mg  daily to 40mg  daily.  He has history of intolerance to statins - crestor, atorvastatin.   Current Medications (verified) Outpatient Encounter Prescriptions as of 08/01/2014  Medication Sig  . aspirin EC 81 MG tablet Take 81 mg by mouth daily.  Marland Kitchen gabapentin (NEURONTIN) 300 MG capsule Take 1 capsule (300 mg total) by mouth 3 (three) times daily. (Patient taking differently: Take 300 mg by mouth at bedtime. )  . insulin NPH-regular Human (NOVOLIN 70/30 RELION) (70-30) 100 UNIT/ML injection Inject 75 units twice a day (Patient taking differently: Inject 60 Units into the skin 2 (two) times daily with a meal. Inject 60 units twice a day)  . isosorbide mononitrate (IMDUR) 30 MG 24 hr tablet Take 0.5 tablets (15 mg total) by mouth daily.  Marland Kitchen lisinopril (PRINIVIL,ZESTRIL) 5 MG tablet Take 1 tablet (5 mg total) by mouth daily.  . metoprolol tartrate (LOPRESSOR) 25 MG tablet Take 0.5 tablets (12.5 mg total) by mouth 2 (two) times daily.  Marland Kitchen omeprazole (PRILOSEC) 20 MG capsule Take 1  capsule (20 mg total) by mouth daily.  . pravastatin (PRAVACHOL) 40 MG tablet Take 1 tablet daily  . sitaGLIPtin (JANUVIA) 100 MG tablet Take 1 tablet (100 mg total) by mouth daily.  . [DISCONTINUED] metoprolol tartrate (LOPRESSOR) 25 MG tablet Take 1 tablet (25 mg total) by mouth 2 (two) times daily. (Patient taking differently: Take 12.5 mg by mouth 2 (two) times daily. )  . buPROPion (WELLBUTRIN XL) 150 MG 24 hr tablet Take 1 tablet (150 mg total) by mouth daily.    Allergies (verified) Ace inhibitors; Invokana; Metformin and related; Horse-derived products; Crestor; and Lipitor   History: Past Medical History  Diagnosis Date  . Diabetes mellitus   . Hypertension   . GERD (gastroesophageal reflux disease)   . Arthritis   . CAD (coronary artery disease)     2010  LAD 50%, RCA 100%.  DES to RCA.  EF 55%  . AODM   . Disorders of iron metabolism   . MURMUR   . CHEST PAIN-UNSPECIFIED   . DISORDERS OF IRON METABOLISM   . Medically noncompliant   . Cellulitis and abscess rt groin   . Hyperlipidemia   . Low serum testosterone level    Past Surgical History  Procedure Laterality Date  . Coronary stent placement  2000    By Dr. Olevia Perches  . Hernia repair  2000  . Back surgery  2015    ACDF by Dr. [name]  . Lesion removal      Lip and hand    Family History  Problem Relation Age of Onset  . Diabetes Father   . Valvular heart disease Father   . Arthritis Father   . Heart disease Father   . Alzheimer's disease Mother   . Hyperlipidemia Mother   . Hypertension Mother   . Cancer Mother     lung  . Arthritis Mother   . Arthritis Sister     rheumatoid  . Dementia Maternal Aunt   . Dementia Maternal Uncle   . Heart disease Maternal Uncle   . Cancer Paternal Uncle     stomach  . Diabetes Sister   . Hypertension Sister   . Hyperlipidemia Sister    Social History   Occupational History  . Not on file.   Social History Main Topics  . Smoking status: Current Some Day Smoker  -- 0.10 packs/day for 51 years    Types: Cigarettes  . Smokeless tobacco: Never Used  . Alcohol Use: No  . Drug Use: No  . Sexual Activity: Yes    Do you feel safe at home?  Yes  Dietary issues and exercise activities: Current Exercise Habits:: Exercise is limited by, Limited by:: orthopedic condition(s)  Current Dietary habits:  Wife fries foods but when patient cooks he does not.  He still reports eating around 2 or 3 am when he awakes "hungry".  Eats peanut butter crackers.   Objective:    Today's Vitals   08/01/14 1431  BP: 126/78  Pulse: 66  Height: 6\' 3"  (1.905 m)  Weight: 268 lb 8 oz (121.791 kg)  PainSc: 4   PainLoc: Foot    Activities of Daily Living In your present state of health, do you have any difficulty performing the following activities: 08/01/2014 07/30/2014  Is the patient deaf or have difficulty hearing? N N  Hearing Y [employer] N  Difficulty concentrating or making decisions Y Y  Walking or climbing stairs? Y N  Doing errands, shopping? N N  Preparing Food and eating ? N -  Using the Toilet? N -  In the past six months, have you accidently leaked urine? N -  Do you have problems with loss of bowel control? N -  Managing your Medications? N -  Managing your Finances? N -  Housekeeping or managing your Housekeeping? N -    Are there smokers in your home (other than you)? No  Cardiac Risk Factors include: advanced age (>65men, >64 women);diabetes mellitus;dyslipidemia;family history of premature cardiovascular disease;hypertension;male gender;obesity (BMI >30kg/m2);sedentary lifestyle;smoking/ tobacco exposure  Depression Screen PHQ 2/9 Scores 08/01/2014 04/06/2014  PHQ - 2 Score 2 0  PHQ- 9 Score 8 -    Fall Risk Fall Risk  08/01/2014 04/06/2014 08/07/2013  Falls in the past year? No No Yes    Cognitive Function: MMSE - Mini Mental State Exam 08/01/2014  Orientation to time 5  Orientation to Place 5  Registration 3  Attention/ Calculation  5  Recall 1  Language- name 2 objects 1  Language- repeat 1  Language- follow 3 step command 3  Language- read & follow direction 1  Write a sentence 1  Copy design 1  Total score 27    Immunizations and Health Maintenance  There is no immunization history on file for this patient. Health Maintenance Due  Topic Date Due  . PNEUMOCOCCAL POLYSACCHARIDE VACCINE (1) 10/11/1952  .  FOOT EXAM  10/11/1960  . HIV Screening  10/11/1965  . URINE MICROALBUMIN  05/31/2014    Patient Care Team: Chipper Herb, MD as PCP - General (Family Medicine) Rodolph Bong, MD as Attending Physician (Cardiology) Glenna Fellows, MD as Attending Physician (Neurosurgery) Lendon Colonel, NP as Nurse Practitioner (Nurse Practitioner) [name], MD as Consulting Physician (Vascular Surgery) Carolan Clines, MD as Consulting Physician (Urology)  Indicate any recent Medical Services you may have received from other than [facility] providers in the past year (date may be approximate).    Assessment:    Annual Wellness Visit  Uncontrolled type 2 DM Back pain Great toe pain Low serum Testosterone - patient stopped testosterone injections at home due to pain. Depression Tobacco Abuse   Screening Tests Health Maintenance  Topic Date Due  . PNEUMOCOCCAL POLYSACCHARIDE VACCINE (1) 10/11/1952  . FOOT EXAM  10/11/1960  . HIV Screening  10/11/1965  . URINE MICROALBUMIN  05/31/2014  . OPHTHALMOLOGY EXAM  08/05/2014 (Originally 10/11/1960)  . COLONOSCOPY  08/05/2014 (Originally 10/11/2000)  . ZOSTAVAX  08/05/2014 (Originally 10/12/2010)  . TETANUS/TDAP  08/05/2014 (Originally 10/11/1969)  . INFLUENZA VACCINE  01/17/2015 (Originally 12/16/2013)  . HEMOGLOBIN A1C  10/05/2014        Plan:   During the course of the visit Alfonse was educated and counseled about the following appropriate screening and preventive services:   Vaccines to include Pneumoccal, Influenza, Hepatitis B, Td, Zostavax - Discussed  vaccine recommendations and benefits.  Patient refused all vaccines  Colorectal cancer screening - referral sent to GI  Cardiovascular disease screening - due to see cardiologist soon  PSA - UTD;  Patient was also encouraged to follow up with urologist regarding ED / low serum testosterone.  Patient advised our office could administer testosterone if he wanted to restart.   Glaucoma screening / Diabetic Eye Exam - needed ASAP  Nutrition counseling - discussed CHO counting and serving sizes.  INcrease vegetable and fruits.  Increase physical activity - but make sure to take glucose tabs incase of hypoglycemia.  Smoking cessation counseling - discussed importance of smoking cessation and trigger avoidance.  Also will start buproprion XL 150mg  1 tablet daily.  Should also help with depression  Advanced Directives - packet given  Increase NPH insulin by 1 unit BID every 2 days to goal of FBG of less than 150 or if he gets hypoglycemia.   Hold pravastatin for 1 month to see if back pain improves.  Will plan to start new statin or PSK9 in future.  Orders Placed This Encounter  Procedures  . Uric acid  . Ambulatory referral to Gastroenterology    Referral Priority:  Routine    Referral Type:  Consultation    Referral Reason:  Specialty Services Required    Requested Specialty:  Gastroenterology    Number of Visits Requested:  1   RTC 1 month to see PCP RTC 2 month to follow up with CDE / diabetes  Dietary issues and exercise activities discussed: Current Exercise Habits:: Exercise is limited by, Limited by:: orthopedic condition(s)  Goals    . Eat more fruits and vegetables    . Increase physical activity      Patient Instructions (the written plan) were given to the patient.   Cherre Robins, [facility]   08/01/2014

## 2014-08-01 NOTE — Patient Instructions (Addendum)
Increase Insulin NPH by 1 unit twice a day every 2 days until fasting blood glucose was 150 or less or unless you have low blood glucose.  Call me if you have problems or if blood glucose is not improving.  [name] [name] [phone]  Hhold pravastatin - see if back pain improves  Start buproproin $RemoveBeforeDE'[id number]$  XL to help with mood, energy level and to help with smoking   Preventive Care for Adults A healthy lifestyle and preventive care can promote health and wellness. Preventive health guidelines for men include the following key practices:  A routine yearly physical is a good way to check with your health care provider about your health and preventative screening. It is a chance to share any concerns and updates on your health and to receive a thorough exam.  Visit your dentist for a routine exam and preventative care every 6 months. Brush your teeth twice a day and floss once a day. Good oral hygiene prevents tooth decay and gum disease.  The frequency of eye exams is based on your age, health, family medical history, use of contact lenses, and other factors. Follow your health care provider's recommendations for frequency of eye exams.  Eat a healthy diet. Foods such as vegetables, fruits, whole grains, low-fat dairy products, and lean protein foods contain the nutrients you need without too many calories. Decrease your intake of foods high in solid fats, added sugars, and salt. Eat the right amount of calories for you.Get information about a proper diet from your health care provider, if necessary.  Regular physical exercise is one of the most important things you can do for your health. Most adults should get at least 150 minutes of moderate-intensity exercise (any activity that increases your heart rate and causes you to sweat) each week. In addition, most adults need muscle-strengthening exercises on 2 or more days a week.  Maintain a healthy weight. The body mass index (BMI) is a screening tool  to identify possible weight problems. It provides an estimate of body fat based on height and weight. Your health care provider can find your BMI and can help you achieve or maintain a healthy weight.For adults 20 years and older:  A BMI below 18.5 is considered underweight.  A BMI of 18.5 to 24.9 is normal.  A BMI of 25 to 29.9 is considered overweight.  A BMI of 30 and above is considered obese.  Maintain normal blood lipids and cholesterol levels by exercising and minimizing your intake of saturated fat. Eat a balanced diet with plenty of fruit and vegetables. Blood tests for lipids and cholesterol should begin at age 41 and be repeated every 5 years. If your lipid or cholesterol levels are high, you are over 50, or you are at high risk for heart disease, you may need your cholesterol levels checked more frequently.Ongoing high lipid and cholesterol levels should be treated with medicines if diet and exercise are not working.  If you smoke, find out from your health care provider how to quit. If you do not use tobacco, do not start.  Lung cancer screening is recommended for adults aged 32-80 years who are at high risk for developing lung cancer because of a history of smoking. A yearly low-dose CT scan of the lungs is recommended for people who have at least a 30-pack-year history of smoking and are a current smoker or have quit within the past 15 years. A pack year of smoking is smoking an average of 1 pack  of cigarettes a day for 1 year (for example: 1 pack a day for 30 years or 2 packs a day for 15 years). Yearly screening should continue until the smoker has stopped smoking for at least 15 years. Yearly screening should be stopped for people who develop a health problem that would prevent them from having lung cancer treatment.  If you choose to drink alcohol, do not have more than 2 drinks per day. One drink is considered to be 12 ounces (355 mL) of beer, 5 ounces (148 mL) of wine, or 1.5  ounces (44 mL) of liquor.  Avoid use of street drugs. Do not share needles with anyone. Ask for help if you need support or instructions about stopping the use of drugs.  High blood pressure causes heart disease and increases the risk of stroke. Your blood pressure should be checked at least every 1-2 years. Ongoing high blood pressure should be treated with medicines, if weight loss and exercise are not effective.  If you are 86-79 years old, ask your health care provider if you should take aspirin to prevent heart disease.  Diabetes screening involves taking a blood sample to check your fasting blood sugar level. This should be done once every 3 years, after age 59, if you are within normal weight and without risk factors for diabetes. Testing should be considered at a younger age or be carried out more frequently if you are overweight and have at least 1 risk factor for diabetes.  Colorectal cancer can be detected and often prevented. Most routine colorectal cancer screening begins at the age of 77 and continues through age 36. However, your health care provider may recommend screening at an earlier age if you have risk factors for colon cancer. On a yearly basis, your health care provider may provide home test kits to check for hidden blood in the stool. Use of a small camera at the end of a tube to directly examine the colon (sigmoidoscopy or colonoscopy) can detect the earliest forms of colorectal cancer. Talk to your health care provider about this at age 46, when routine screening begins. Direct exam of the colon should be repeated every 5-10 years through age 57, unless early forms of precancerous polyps or small growths are found.  People who are at an increased risk for hepatitis B should be screened for this virus. You are considered at high risk for hepatitis B if:  You were born in a country where hepatitis B occurs often. Talk with your health care provider about which countries are  considered high risk.  Your parents were born in a high-risk country and you have not received a shot to protect against hepatitis B (hepatitis B vaccine).  You have HIV or AIDS.  You use needles to inject street drugs.  You live with, or have sex with, someone who has hepatitis B.  You are a man who has sex with other men (MSM).  You get hemodialysis treatment.  You take certain medicines for conditions such as cancer, organ transplantation, and autoimmune conditions.  Hepatitis C blood testing is recommended for all people born from [birth year] through 1965 and any individual with known risks for hepatitis C.  Practice safe sex. Use condoms and avoid high-risk sexual practices to reduce the spread of sexually transmitted infections (STIs). STIs include gonorrhea, chlamydia, syphilis, trichomonas, herpes, HPV, and human immunodeficiency virus (HIV). Herpes, HIV, and HPV are viral illnesses that have no cure. They can result in disability, cancer, and  death.  If you are at risk of being infected with HIV, it is recommended that you take a prescription medicine daily to prevent HIV infection. This is called preexposure prophylaxis (PrEP). You are considered at risk if:  You are a man who has sex with other men (MSM) and have other risk factors.  You are a heterosexual man, are sexually active, and are at increased risk for HIV infection.  You take drugs by injection.  You are sexually active with a partner who has HIV.  Talk with your health care provider about whether you are at high risk of being infected with HIV. If you choose to begin PrEP, you should first be tested for HIV. You should then be tested every 3 months for as long as you are taking PrEP.  A one-time screening for abdominal aortic aneurysm (AAA) and surgical repair of large AAAs by ultrasound are recommended for men ages 57 to 51 years who are current or former smokers.  Healthy men should no longer receive  prostate-specific antigen (PSA) blood tests as part of routine cancer screening. Talk with your health care provider about prostate cancer screening.  Testicular cancer screening is not recommended for adult males who have no symptoms. Screening includes self-exam, a health care provider exam, and other screening tests. Consult with your health care provider about any symptoms you have or any concerns you have about testicular cancer.  Use sunscreen. Apply sunscreen liberally and repeatedly throughout the day. You should seek shade when your shadow is shorter than you. Protect yourself by wearing long sleeves, pants, a wide-brimmed hat, and sunglasses year round, whenever you are outdoors.  Once a month, do a whole-body skin exam, using a mirror to look at the skin on your back. Tell your health care provider about new moles, moles that have irregular borders, moles that are larger than a pencil eraser, or moles that have changed in shape or color.  Stay current with required vaccines (immunizations).  Influenza vaccine. All adults should be immunized every year.  Tetanus, diphtheria, and acellular pertussis (Td, Tdap) vaccine. An adult who has not previously received Tdap or who does not know his vaccine status should receive 1 dose of Tdap. This initial dose should be followed by tetanus and diphtheria toxoids (Td) booster doses every 10 years. Adults with an unknown or incomplete history of completing a 3-dose immunization series with Td-containing vaccines should begin or complete a primary immunization series including a Tdap dose. Adults should receive a Td booster every 10 years.  Varicella vaccine. An adult without evidence of immunity to varicella should receive 2 doses or a second dose if he has previously received 1 dose.  Human papillomavirus (HPV) vaccine. Males aged 45-21 years who have not received the vaccine previously should receive the 3-dose series. Males aged 22-26 years may be  immunized. Immunization is recommended through the age of 35 years for any male who has sex with males and did not get any or all doses earlier. Immunization is recommended for any person with an immunocompromised condition through the age of 60 years if he did not get any or all doses earlier. During the 3-dose series, the second dose should be obtained 4-8 weeks after the first dose. The third dose should be obtained 24 weeks after the first dose and 16 weeks after the second dose.  Zoster vaccine. One dose is recommended for adults aged 54 years or older unless certain conditions are present.  Measles, mumps, and  rubella (MMR) vaccine. Adults born before [birth year] generally are considered immune to measles and mumps. Adults born in [birth year] or later should have 1 or more doses of MMR vaccine unless there is a contraindication to the vaccine or there is laboratory evidence of immunity to each of the three diseases. A routine second dose of MMR vaccine should be obtained at least 28 days after the first dose for students attending postsecondary schools, health care workers, or international travelers. People who received inactivated measles vaccine or an unknown type of measles vaccine during 1963-1967 should receive 2 doses of MMR vaccine. People who received inactivated mumps vaccine or an unknown type of mumps vaccine before 1979 and are at high risk for mumps infection should consider immunization with 2 doses of MMR vaccine. Unvaccinated health care workers born before [birth year] who lack laboratory evidence of measles, mumps, or rubella immunity or laboratory confirmation of disease should consider measles and mumps immunization with 2 doses of MMR vaccine or rubella immunization with 1 dose of MMR vaccine.  Pneumococcal 13-valent conjugate (PCV13) vaccine. When indicated, a person who is uncertain of his immunization history and has no record of immunization should receive the PCV13 vaccine. An adult aged 26 years or  older who has certain medical conditions and has not been previously immunized should receive 1 dose of PCV13 vaccine. This PCV13 should be followed with a dose of pneumococcal polysaccharide (PPSV23) vaccine. The PPSV23 vaccine dose should be obtained at least 8 weeks after the dose of PCV13 vaccine. An adult aged 11 years or older who has certain medical conditions and previously received 1 or more doses of PPSV23 vaccine should receive 1 dose of PCV13. The PCV13 vaccine dose should be obtained 1 or more years after the last PPSV23 vaccine dose.  Pneumococcal polysaccharide (PPSV23) vaccine. When PCV13 is also indicated, PCV13 should be obtained first. All adults aged 20 years and older should be immunized. An adult younger than age 64 years who has certain medical conditions should be immunized. Any person who resides in a nursing home or long-term care facility should be immunized. An adult smoker should be immunized. People with an immunocompromised condition and certain other conditions should receive both PCV13 and PPSV23 vaccines. People with human immunodeficiency virus (HIV) infection should be immunized as soon as possible after diagnosis. Immunization during chemotherapy or radiation therapy should be avoided. Routine use of PPSV23 vaccine is not recommended for American Indians, [place] Natives, or people younger than 65 years unless there are medical conditions that require PPSV23 vaccine. When indicated, people who have unknown immunization and have no record of immunization should receive PPSV23 vaccine. One-time revaccination 5 years after the first dose of PPSV23 is recommended for people aged 19-64 years who have chronic kidney failure, nephrotic syndrome, asplenia, or immunocompromised conditions. People who received 1-2 doses of PPSV23 before age 38 years should receive another dose of PPSV23 vaccine at age 65 years or later if at least 5 years have passed since the previous dose. Doses of  PPSV23 are not needed for people immunized with PPSV23 at or after age 57 years.  Meningococcal vaccine. Adults with asplenia or persistent complement component deficiencies should receive 2 doses of quadrivalent meningococcal conjugate (MenACWY-D) vaccine. The doses should be obtained at least 2 months apart. Microbiologists working with certain meningococcal bacteria, [employer] recruits, people at risk during an outbreak, and people who travel to or live in countries with a high rate of meningitis should be immunized. A first-year college  student up through age 79 years who is living in a residence hall should receive a dose if he did not receive a dose on or after his 16th birthday. Adults who have certain high-risk conditions should receive one or more doses of vaccine.  Hepatitis A vaccine. Adults who wish to be protected from this disease, have certain high-risk conditions, work with hepatitis A-infected animals, work in hepatitis A research labs, or travel to or work in countries with a high rate of hepatitis A should be immunized. Adults who were previously unvaccinated and who anticipate close contact with an international adoptee during the first 60 days after arrival in the [place] from a country with a high rate of hepatitis A should be immunized.  Hepatitis B vaccine. Adults should be immunized if they wish to be protected from this disease, have certain high-risk conditions, may be exposed to blood or other infectious body fluids, are household contacts or sex partners of hepatitis B positive people, are clients or workers in certain care facilities, or travel to or work in countries with a high rate of hepatitis B.  Haemophilus influenzae type b (Hib) vaccine. A previously unvaccinated person with asplenia or sickle cell disease or having a scheduled splenectomy should receive 1 dose of Hib vaccine. Regardless of previous immunization, a recipient of a hematopoietic stem cell transplant  should receive a 3-dose series 6-12 months after his successful transplant. Hib vaccine is not recommended for adults with HIV infection. Preventive Service / Frequency Ages 34 to 21  Blood pressure check.** / Every 1 to 2 years.  Lipid and cholesterol check.** / Every 5 years beginning at age 39.  Hepatitis C blood test.** / For any individual with known risks for hepatitis C.  Skin self-exam. / Monthly.  Influenza vaccine. / Every year.  Tetanus, diphtheria, and acellular pertussis (Tdap, Td) vaccine.** / Consult your health care provider. 1 dose of Td every 10 years.  Varicella vaccine.** / Consult your health care provider.  HPV vaccine. / 3 doses over 6 months, if 41 or younger.  Measles, mumps, rubella (MMR) vaccine.** / You need at least 1 dose of MMR if you were born in 1957 or later. You may also need a second dose.  Pneumococcal 13-valent conjugate (PCV13) vaccine.** / Consult your health care provider.  Pneumococcal polysaccharide (PPSV23) vaccine.** / 1 to 2 doses if you smoke cigarettes or if you have certain conditions.  Meningococcal vaccine.** / 1 dose if you are age 18 to 7 years and a Market researcher living in a residence hall, or have one of several medical conditions. You may also need additional booster doses.  Hepatitis A vaccine.** / Consult your health care provider.  Hepatitis B vaccine.** / Consult your health care provider.  Haemophilus influenzae type b (Hib) vaccine.** / Consult your health care provider. Ages 72 to 69  Blood pressure check.** / Every 1 to 2 years.  Lipid and cholesterol check.** / Every 5 years beginning at age 36.  Lung cancer screening. / Every year if you are aged 50-80 years and have a 30-pack-year history of smoking and currently smoke or have quit within the past 15 years. Yearly screening is stopped once you have quit smoking for at least 15 years or develop a health problem that would prevent you from having  lung cancer treatment.  Fecal occult blood test (FOBT) of stool. / Every year beginning at age 64 and continuing until age 34. You may not have to  do this test if you get a colonoscopy every 10 years.  Flexible sigmoidoscopy** or colonoscopy.** / Every 5 years for a flexible sigmoidoscopy or every 10 years for a colonoscopy beginning at age 56 and continuing until age 27.  Hepatitis C blood test.** / For all people born from [birth year] through 1965 and any individual with known risks for hepatitis C.  Skin self-exam. / Monthly.  Influenza vaccine. / Every year.  Tetanus, diphtheria, and acellular pertussis (Tdap/Td) vaccine.** / Consult your health care provider. 1 dose of Td every 10 years.  Varicella vaccine.** / Consult your health care provider.  Zoster vaccine.** / 1 dose for adults aged 32 years or older.  Measles, mumps, rubella (MMR) vaccine.** / You need at least 1 dose of MMR if you were born in 1957 or later. You may also need a second dose.  Pneumococcal 13-valent conjugate (PCV13) vaccine.** / Consult your health care provider.  Pneumococcal polysaccharide (PPSV23) vaccine.** / 1 to 2 doses if you smoke cigarettes or if you have certain conditions.  Meningococcal vaccine.** / Consult your health care provider.  Hepatitis A vaccine.** / Consult your health care provider.  Hepatitis B vaccine.** / Consult your health care provider.  Haemophilus influenzae type b (Hib) vaccine.** / Consult your health care provider. Ages 17 and over  Blood pressure check.** / Every 1 to 2 years.  Lipid and cholesterol check.**/ Every 5 years beginning at age 59.  Lung cancer screening. / Every year if you are aged 13-80 years and have a 30-pack-year history of smoking and currently smoke or have quit within the past 15 years. Yearly screening is stopped once you have quit smoking for at least 15 years or develop a health problem that would prevent you from having lung cancer  treatment.  Fecal occult blood test (FOBT) of stool. / Every year beginning at age 1 and continuing until age 53. You may not have to do this test if you get a colonoscopy every 10 years.  Flexible sigmoidoscopy** or colonoscopy.** / Every 5 years for a flexible sigmoidoscopy or every 10 years for a colonoscopy beginning at age 59 and continuing until age 39.  Hepatitis C blood test.** / For all people born from [birth year] through 1965 and any individual with known risks for hepatitis C.  Abdominal aortic aneurysm (AAA) screening.** / A one-time screening for ages 34 to 14 years who are current or former smokers.  Skin self-exam. / Monthly.  Influenza vaccine. / Every year.  Tetanus, diphtheria, and acellular pertussis (Tdap/Td) vaccine.** / 1 dose of Td every 10 years.  Varicella vaccine.** / Consult your health care provider.  Zoster vaccine.** / 1 dose for adults aged 89 years or older.  Pneumococcal 13-valent conjugate (PCV13) vaccine.** / Consult your health care provider.  Pneumococcal polysaccharide (PPSV23) vaccine.** / 1 dose for all adults aged 69 years and older.  Meningococcal vaccine.** / Consult your health care provider.  Hepatitis A vaccine.** / Consult your health care provider.  Hepatitis B vaccine.** / Consult your health care provider.  Haemophilus influenzae type b (Hib) vaccine.** / Consult your health care provider. **Family history and personal history of risk and conditions may change your health care provider's recommendations. Document Released: 06/30/2001 Document Revised: 05/09/2013 Document Reviewed: 09/29/2010 [facility] Patient Information 2015 [place], [place]. This information is not intended to replace advice given to you by your health care provider. Make sure you discuss any questions you have with your health care provider.

## 2014-08-02 LAB — URIC ACID: Uric Acid: 3.9 mg/dL (ref 3.7–8.6)

## 2014-08-06 ENCOUNTER — Telehealth: Payer: Self-pay | Admitting: Pharmacist

## 2014-08-06 NOTE — Telephone Encounter (Signed)
Patient notified of uric acid level - WNL.  Advised patient to made appt for provider to check foot.  He states that it is a little better and will call if continues to have pain / discomfort or if worsens.

## 2014-08-13 ENCOUNTER — Ambulatory Visit (INDEPENDENT_AMBULATORY_CARE_PROVIDER_SITE_OTHER): Payer: Medicare Other | Admitting: Family Medicine

## 2014-08-13 ENCOUNTER — Ambulatory Visit (INDEPENDENT_AMBULATORY_CARE_PROVIDER_SITE_OTHER): Payer: Medicare Other

## 2014-08-13 ENCOUNTER — Encounter: Payer: Self-pay | Admitting: Family Medicine

## 2014-08-13 VITALS — BP 180/86 | HR 71 | Temp 96.8°F | Ht 75.0 in | Wt 276.0 lb

## 2014-08-13 DIAGNOSIS — R109 Unspecified abdominal pain: Secondary | ICD-10-CM | POA: Diagnosis not present

## 2014-08-13 DIAGNOSIS — K219 Gastro-esophageal reflux disease without esophagitis: Secondary | ICD-10-CM | POA: Diagnosis not present

## 2014-08-13 DIAGNOSIS — R140 Abdominal distension (gaseous): Secondary | ICD-10-CM | POA: Diagnosis not present

## 2014-08-13 LAB — POCT URINALYSIS DIPSTICK
Bilirubin, UA: NEGATIVE
Blood, UA: NEGATIVE
Glucose, UA: NEGATIVE
Ketones, UA: NEGATIVE
Leukocytes, UA: NEGATIVE
Nitrite, UA: NEGATIVE
Spec Grav, UA: >=1.03
Urobilinogen, UA: NEGATIVE
pH, UA: 6

## 2014-08-13 LAB — POCT UA - MICROSCOPIC ONLY
Bacteria, U Microscopic: NEGATIVE
Casts, Ur, LPF, POC: NEGATIVE
Crystals, Ur, HPF, POC: NEGATIVE
RBC, urine, microscopic: NEGATIVE
Yeast, UA: NEGATIVE

## 2014-08-13 LAB — POCT CBC
Granulocyte percent: 74.7 %G (ref 37–80)
HCT, POC: 51.9 % (ref 43.5–53.7)
Hemoglobin: 15.9 g/dL (ref 14.1–18.1)
Lymph, poc: 2.6 (ref 0.6–3.4)
MCH, POC: 26.4 pg — AB (ref 27–31.2)
MCHC: 30.6 g/dL — AB (ref 31.8–35.4)
MCV: 86.2 fL (ref 80–97)
MPV: 7.3 fL (ref 0–99.8)
POC Granulocyte: 8.1 — AB (ref 2–6.9)
POC LYMPH PERCENT: 24 %L (ref 10–50)
Platelet Count, POC: 321 10*3/uL (ref 142–424)
RBC: 6.02 M/uL (ref 4.69–6.13)
RDW, POC: 13.2 %
WBC: 10.8 10*3/uL — AB (ref 4.6–10.2)

## 2014-08-13 MED ORDER — OMEPRAZOLE 40 MG PO CPDR: DELAYED_RELEASE_CAPSULE | ORAL | Status: DC

## 2014-08-13 NOTE — Patient Instructions (Signed)
Clear liquids for 24 hours (like 7-Up, ginger ale, Sprite, Jello, frozen pops) Full liquids the second 24-hours (like potato soup, tomato soup, chicken noodle soup) Bland diet the third 24-hours (boiled and baked foods, no fried or greasy foods) Avoid milk, cheese, ice cream and dairy products for 72 hours. Avoid caffeine (cola drinks, coffee, tea, Mountain Dew, Mellow Yellow) Take in small amounts, but frequently. Tylenol and/or Advil as needed for aches pains and fever  Continue to monitor blood pressures at home and take these readings with you to see the cardiologist on Thursday We will arrange for you to have a CT scan of the abdomen for further follow-up Return the FOBT We will also arrange for you to see the gastroenterologist to further evaluate your abdominal pain and be sure and let him know that you've had the CT scan done when you see him

## 2014-08-13 NOTE — Progress Notes (Signed)
Subjective:    Patient ID: [name] [name], male    DOB: 02/26/1951, 64 y.o.   MRN: [id number]  HPI Patient here today for abdominal pain and distention that started about 2 weeks ago.        Patient Active Problem List   Diagnosis Date Noted  . Low serum testosterone level 08/01/2014  . DDD (degenerative disc disease), cervical 12/20/2013  . Obesity (BMI 30.0-34.9) 12/11/2013  . Syncope 08/09/2013  . Chest pain 08/07/2013  . Headache 08/07/2013  . Diaphoresis 08/07/2013  . Type 2 diabetes mellitus with complications 03/00/9233  . Medically noncompliant 10/25/2011  . Cellulitis and abscess rt groin 10/25/2011  . CAD S/P percutaneous coronary angioplasty 10/25/2011  . MURMUR 04/16/2009  . AODM 08/02/2008  . Essential hypertension, benign 08/02/2008  . Hyperlipidemia associated with type 2 diabetes mellitus 07/31/2008  . DISORDERS OF IRON METABOLISM 07/31/2008  . CHEST PAIN-UNSPECIFIED 07/31/2008  . DISORDERS OF IRON METABOLISM 07/31/2008   Outpatient Encounter Prescriptions as of 08/13/2014  Medication Sig  . aspirin EC 81 MG tablet Take 81 mg by mouth daily.  Marland Kitchen buPROPion (WELLBUTRIN XL) 150 MG 24 hr tablet Take 1 tablet (150 mg total) by mouth daily.  Marland Kitchen gabapentin (NEURONTIN) 300 MG capsule Take 1 capsule (300 mg total) by mouth 3 (three) times daily. (Patient taking differently: Take 300 mg by mouth at bedtime. )  . insulin NPH-regular Human (NOVOLIN 70/30 RELION) (70-30) 100 UNIT/ML injection Inject 75 units twice a day (Patient taking differently: Inject 60 Units into the skin 2 (two) times daily with a meal. Inject 60 units twice a day)  . isosorbide mononitrate (IMDUR) 30 MG 24 hr tablet Take 0.5 tablets (15 mg total) by mouth daily.  Marland Kitchen lisinopril (PRINIVIL,ZESTRIL) 5 MG tablet Take 1 tablet (5 mg total) by mouth daily.  . metoprolol tartrate (LOPRESSOR) 25 MG tablet Take 0.5 tablets (12.5 mg total) by mouth 2 (two) times daily.  Marland Kitchen omeprazole (PRILOSEC) 20 MG capsule Take  1 capsule (20 mg total) by mouth daily.  . sitaGLIPtin (JANUVIA) 100 MG tablet Take 1 tablet (100 mg total) by mouth daily.  . [DISCONTINUED] pravastatin (PRAVACHOL) 40 MG tablet Take 1 tablet daily    Review of Systems  Constitutional: Negative.   HENT: Negative.   Eyes: Negative.   Respiratory: Negative.   Cardiovascular: Negative.   Gastrointestinal: Positive for nausea, abdominal pain and abdominal distention. Negative for vomiting, diarrhea, constipation, blood in stool and rectal pain.  Endocrine: Negative.   Genitourinary: Negative.   Musculoskeletal: Negative.   Skin: Negative.   Allergic/Immunologic: Negative.   Neurological: Negative.   Hematological: Negative.   Psychiatric/Behavioral: Negative.        Objective:   Physical Exam  Constitutional: He is oriented to person, place, and time. He appears well-developed and well-nourished. No distress.  HENT:  Head: Normocephalic and atraumatic.  Eyes: Conjunctivae and EOM are normal. Pupils are equal, round, and reactive to light. Right eye exhibits no discharge. Left eye exhibits no discharge. No scleral icterus.  Neck: Normal range of motion. Neck supple. No thyromegaly present.  Cardiovascular: Normal rate, regular rhythm and normal heart sounds.   No murmur heard. Pulmonary/Chest: Effort normal and breath sounds normal. No respiratory distress. He has no wheezes. He has no rales. He exhibits no tenderness.  Clear anteriorly and posteriorly  Abdominal: Soft. Bowel sounds are normal. He exhibits distension. He exhibits no mass. There is tenderness. There is no rebound and no guarding.  Generalized abdominal  tenderness worse in the upper abdominal area  Musculoskeletal: Normal range of motion. He exhibits no edema.  Lymphadenopathy:    He has no cervical adenopathy.  Neurological: He is alert and oriented to person, place, and time.  Skin: Skin is warm and dry. No rash noted. No erythema. No pallor.  Psychiatric: He has  a normal mood and affect. His behavior is normal. Judgment and thought content normal.  Nursing note and vitals reviewed.  BP 180/86 mmHg  Pulse 71  Temp(Src) 96.8 F (36 C) (Oral)  Ht $R'6\' 3"'Fm$  (1.905 m)  Wt 276 lb (125.193 kg)  BMI 34.50 kg/m2  [facility] reading (PRIMARY) by  Dr.Moore-KUB-no acute abnormality                                         Assessment & Plan:  1. Abdominal pain, unspecified abdominal location -Follow diet as directed - POCT CBC - BMP8+EGFR - Hepatic function panel - POCT UA - Microscopic Only - POCT urinalysis dipstick - DG Abd 1 View; Future - Ambulatory referral to Gastroenterology - CT Abdomen Pelvis Wo Contrast; Future  2. Abdominal distension -Follow diet as directed and we will arrange for a visit with the gastroenterologist and getting the CT scan - POCT CBC - BMP8+EGFR - Hepatic function panel - POCT UA - Microscopic Only - POCT urinalysis dipstick - DG Abd 1 View; Future - Ambulatory referral to Gastroenterology - CT Abdomen Pelvis Wo Contrast; Future  3. Gastroesophageal reflux disease, esophagitis presence not specified -Increase omeprazole to 40 mg twice daily one half hour before breakfast and supper  No orders of the defined types were placed in this encounter.   Patient Instructions  Clear liquids for 24 hours (like 7-Up, ginger ale, Sprite, Jello, frozen pops) Full liquids the second 24-hours (like potato soup, tomato soup, chicken noodle soup) Bland diet the third 24-hours (boiled and baked foods, no fried or greasy foods) Avoid milk, cheese, ice cream and dairy products for 72 hours. Avoid caffeine (cola drinks, coffee, tea, Mountain Dew, Mellow Yellow) Take in small amounts, but frequently. Tylenol and/or Advil as needed for aches pains and fever  Continue to monitor blood pressures at home and take these readings with you to see the cardiologist on Thursday We will arrange for you to have a CT scan of the abdomen for  further follow-up Return the FOBT We will also arrange for you to see the gastroenterologist to further evaluate your abdominal pain and be sure and let him know that you've had the CT scan done when you see him   Arrie Senate MD

## 2014-08-14 ENCOUNTER — Other Ambulatory Visit: Payer: Self-pay | Admitting: *Deleted

## 2014-08-14 LAB — HEPATIC FUNCTION PANEL
ALT: 10 IU/L (ref 0–44)
AST: 15 IU/L (ref 0–40)
Albumin: 4.1 g/dL (ref 3.6–4.8)
Alkaline Phosphatase: 86 IU/L (ref 39–117)
Bilirubin Total: 0.3 mg/dL (ref 0.0–1.2)
Bilirubin, Direct: 0.14 mg/dL (ref 0.00–0.40)
Total Protein: 6.7 g/dL (ref 6.0–8.5)

## 2014-08-14 LAB — BMP8+EGFR
BUN/Creatinine Ratio: 22 (ref 10–22)
BUN: 17 mg/dL (ref 8–27)
CO2: 25 mmol/L (ref 18–29)
Calcium: 9.5 mg/dL (ref 8.6–10.2)
Chloride: 97 mmol/L (ref 97–108)
Creatinine, Ser: 0.77 mg/dL (ref 0.76–1.27)
GFR calc Af Amer: 112 mL/min/{1.73_m2} (ref >59)
GFR calc non Af Amer: 97 mL/min/{1.73_m2} (ref >59)
Glucose: 190 mg/dL — ABNORMAL HIGH (ref 65–99)
Potassium: 4.2 mmol/L (ref 3.5–5.2)
Sodium: 139 mmol/L (ref 134–144)

## 2014-08-14 MED ORDER — INSULIN NPH ISOPHANE & REGULAR (70-30) 100 UNIT/ML ~~LOC~~ SUSP: SUBCUTANEOUS | Status: DC

## 2014-08-16 ENCOUNTER — Encounter: Payer: Self-pay | Admitting: Adult Health

## 2014-08-16 ENCOUNTER — Ambulatory Visit (INDEPENDENT_AMBULATORY_CARE_PROVIDER_SITE_OTHER): Payer: Medicare Other | Admitting: Adult Health

## 2014-08-16 VITALS — BP 128/78 | HR 71 | Ht 75.0 in | Wt 270.0 lb

## 2014-08-16 DIAGNOSIS — I2510 Atherosclerotic heart disease of native coronary artery without angina pectoris: Secondary | ICD-10-CM | POA: Diagnosis not present

## 2014-08-16 DIAGNOSIS — I10 Essential (primary) hypertension: Secondary | ICD-10-CM | POA: Diagnosis not present

## 2014-08-16 NOTE — Progress Notes (Signed)
Cardiology Office Note   Date:  08/16/2014   ID:  [name] [name], DOB [birth year], MRN [id number]  PCP:  [name] Gainer, MD  Cardiologist:  [name] Spring, NP   Chief Complaint  Patient presents with  . Hypertension  . Coronary Artery Disease    S/P PCI       History of Present Illness: [name] [name] is a 64 y.o. male who presents for ongoing assessment and management of CAD, (DES to RCA in RCA CTO 2010), hypertension, who was recently discharged from  [facility] with complaints of chest pain.  He described it as intermittent, sharp pain.  He was felt to be related to GI abnormality as it becomes more severe with eating foods and drinking soda.  He received an antacid and began to belch and was feeling much better.  Troponins were found to be negative.  Echocardiogram was completed with an EF of 50-55% with no wall motion abnormalities.  He is here for post hospitalization followup.  He is will cardiac standpoint.  He is scheduled tomorrow to have a CT scan of his abdomen due to recurrent abdominal pain.  This is been scheduled by his primary care physician, Dr. Laurance [name].  He is also to have a EGD and colonoscopy next week through Dr. Orvan [name] office.  He is medically compliant.  He is having labile blood pressures that are rising with abdominal pain.  He states, he actually makes him feel some better.    Past Medical History  Diagnosis Date  . Diabetes mellitus   . Hypertension   . GERD (gastroesophageal reflux disease)   . Arthritis   . CAD (coronary artery disease)     2010  LAD 50%, RCA 100%.  DES to RCA.  EF 55%  . AODM   . Disorders of iron metabolism   . MURMUR   . CHEST PAIN-UNSPECIFIED   . DISORDERS OF IRON METABOLISM   . Medically noncompliant   . Cellulitis and abscess rt groin   . Hyperlipidemia   . Low serum testosterone level     Past Surgical History  Procedure Laterality Date  . Coronary stent placement  2000    By Dr. Olevia [name]  . Hernia repair  2000  . Back  surgery  2015    ACDF by Dr. Carloyn [name]  . Lesion removal      Lip and hand      Current Outpatient Prescriptions  Medication Sig Dispense Refill  . aspirin EC 81 MG tablet Take 81 mg by mouth daily.    Marland Kitchen buPROPion (WELLBUTRIN XL) 150 MG 24 hr tablet Take 1 tablet (150 mg total) by mouth daily. 30 tablet 0  . gabapentin (NEURONTIN) 300 MG capsule Take 1 capsule (300 mg total) by mouth 3 (three) times daily. (Patient taking differently: Take 300 mg by mouth at bedtime. ) 90 capsule 11  . insulin NPH-regular Human (NOVOLIN 70/30 RELION) (70-30) 100 UNIT/ML injection Inject 75 units twice a day 50 mL 2  . isosorbide mononitrate (IMDUR) 30 MG 24 hr tablet Take 0.5 tablets (15 mg total) by mouth daily. 30 tablet 2  . lisinopril (PRINIVIL,ZESTRIL) 5 MG tablet Take 1 tablet (5 mg total) by mouth daily. 30 tablet 11  . metoprolol tartrate (LOPRESSOR) 25 MG tablet Take 0.5 tablets (12.5 mg total) by mouth 2 (two) times daily. 90 tablet 0  . omeprazole (PRILOSEC) 40 MG capsule One twice daily half an hour before breakfast and supper 60 capsule 3  .  sitaGLIPtin (JANUVIA) 100 MG tablet Take 1 tablet (100 mg total) by mouth daily. 35 tablet 0   No current facility-administered medications for this visit.    Allergies:   Ace inhibitors; Invokana; Metformin and related; Horse-derived products; Crestor; and Lipitor    Social History:  The patient  reports that he has been smoking Cigarettes.  He has a 5.1 pack-year smoking history. He has never used smokeless tobacco. He reports that he does not drink alcohol or use illicit drugs.   Family History:  The patient's family history includes Alzheimer's disease in his mother; Arthritis in his father, mother, and sister; Cancer in his mother and paternal uncle; Dementia in his maternal aunt and maternal uncle; Diabetes in his father and sister; Heart disease in his father and maternal uncle; Hyperlipidemia in his mother and sister; Hypertension in his mother and  sister; Valvular heart disease in his father.    ROS: .   All other systems are reviewed and negative.Unless otherwise mentioned in H&P above.   PHYSICAL EXAM: VS:  There were no vitals taken for this visit. , BMI There is no weight on file to calculate BMI. GEN: Well nourished, well developed, in no acute distress HEENT: normal Neck: no JVD, carotid bruits, or masses Cardiac: RRR; no murmurs, rubs, or gallops,no edema  Respiratory:  Clear to auscultation bilaterally, normal work of breathing GI: soft, nontender, nondistended, + BS, obese MS: no deformity or atrophy Skin: warm and dry, no rash Neuro:  Strength and sensation are intact Psych: euthymic mood, full affect   Recent Labs: 12/20/2013: TSH 1.160 07/30/2014: Platelets 209 08/13/2014: ALT 10; BUN 17; Creatinine 0.77; Hemoglobin 15.9; Potassium 4.2; Sodium 139    Lipid Panel    Component Value Date/Time   CHOL 178 07/31/2014 0607   CHOL 219* 05/31/2013 1039   TRIG 511* 07/31/2014 0607   HDL 26* 07/31/2014 0607   HDL 22* 05/31/2013 1039   CHOLHDL 6.8 07/31/2014 0607   CHOLHDL 10.0* 05/31/2013 1039   VLDL UNABLE TO CALCULATE IF TRIGLYCERIDE OVER 400 mg/dL 07/31/2014 0607   LDLCALC UNABLE TO CALCULATE IF TRIGLYCERIDE OVER 400 mg/dL 07/31/2014 0607   LDLCALC Comment 05/31/2013 1039      Wt Readings from Last 3 Encounters:  08/13/14 276 lb (125.193 kg)  08/01/14 268 lb 8 oz (121.791 kg)  07/30/14 252 lb (114.306 kg)      Other studies Reviewed: Additional studies/ records that were reviewed today include: Abdominal films. Review of the above records demonstrates: Normal bowel pattern with stool in colon.   ASSESSMENT AND PLAN:  1. Hypertension: Blood pressure is been lab on oh.  Usually associated with abdominal pain.  Once he feels better.  Blood pressure is normalized.  It has been as high as 196/101-138/78. Pressure is well-controlled.  He is compliant with medications.  I will not make any changes  currently.  2. CAD: he is without cardiac complaints.  He will continue aspirin, isosorbide, metoprolol, and ACE inhibitor.  I have advised him to please take his metoprolol the day of his  EGD to avoid rapid heart rhythm.he is to take medications as directed by GI.  Prior to study.  3. Abdominal pain:he is scheduled for GI series and EGD and colonoscopy within the next few days.  I will defer to primary care, and GI for further treatment   Current medicines are reviewed at length with the patient today.    Labs/ tests ordered today include: None No orders of the defined  types were placed in this encounter.     Disposition:   FU with 6 months Signed, Jory Sims, NP  08/16/2014 7:17 AM    [facility] [address] [place], [place] [address] Phone: [phone]; Fax: [phone]

## 2014-08-16 NOTE — Progress Notes (Deleted)
Name: [name] [name]    DOB: November 30, 1950  Age: 64 y.o.  MR#: [id number]       PCP:  Redge Gainer, MD      Insurance: Payor: [employer] MEDICARE / Plan: [facility] MEDICARE / Product Type: *No Product type* /   CC:    Chief Complaint  Patient presents with  . Hypertension  . Coronary Artery Disease    S/P PCI     VS Filed Vitals:   08/16/14 1353  BP: 128/78  Pulse: 71  Height: 6\' 3"  (1.905 m)  Weight: 270 lb (122.471 kg)    Weights Current Weight  08/16/14 270 lb (122.471 kg)  08/13/14 276 lb (125.193 kg)  08/01/14 268 lb 8 oz (121.791 kg)    Blood Pressure  BP Readings from Last 3 Encounters:  08/16/14 128/78  08/13/14 180/86  08/01/14 126/78     Admit date:  (Not on file) Last encounter with RMR:  Visit date not found   Allergy Ace inhibitors; Invokana; Metformin and related; Horse-derived products; Crestor; and Lipitor  Current Outpatient Prescriptions  Medication Sig Dispense Refill  . aspirin EC 81 MG tablet Take 81 mg by mouth daily.    Marland Kitchen buPROPion (WELLBUTRIN XL) 150 MG 24 hr tablet Take 1 tablet (150 mg total) by mouth daily. 30 tablet 0  . gabapentin (NEURONTIN) 300 MG capsule Take 1 capsule (300 mg total) by mouth 3 (three) times daily. (Patient taking differently: Take 300 mg by mouth at bedtime. ) 90 capsule 11  . isosorbide mononitrate (IMDUR) 30 MG 24 hr tablet Take 0.5 tablets (15 mg total) by mouth daily. 30 tablet 2  . metoprolol tartrate (LOPRESSOR) 25 MG tablet Take 0.5 tablets (12.5 mg total) by mouth 2 (two) times daily. 90 tablet 0  . omeprazole (PRILOSEC) 40 MG capsule One twice daily half an hour before breakfast and supper (Patient taking differently: 40 mg 2 (two) times daily before a meal. One twice daily half an hour before breakfast and supper) 60 capsule 3  . sitaGLIPtin (JANUVIA) 100 MG tablet Take 1 tablet (100 mg total) by mouth daily. 35 tablet 0  . carbidopa-levodopa (SINEMET CR) 50-200 MG per tablet Take 1 tablet by mouth daily.     .  insulin NPH-regular Human (NOVOLIN 70/30 RELION) (70-30) 100 UNIT/ML injection Inject 75 units twice a day (Patient taking differently: Inject 64 Units into the skin 2 (two) times daily with a meal. Inject 75 units twice a day) 50 mL 2  . lisinopril (PRINIVIL,ZESTRIL) 5 MG tablet Take 1 tablet (5 mg total) by mouth daily. (Patient taking differently: Take 10 mg by mouth daily. ) 30 tablet 11  . meloxicam (MOBIC) 15 MG tablet      No current facility-administered medications for this visit.    Discontinued Meds:   There are no discontinued medications.  Patient Active Problem List   Diagnosis Date Noted  . Low serum testosterone level 08/01/2014  . DDD (degenerative disc disease), cervical 12/20/2013  . Obesity (BMI 30.0-34.9) 12/11/2013  . Syncope 08/09/2013  . Chest pain 08/07/2013  . Headache 08/07/2013  . Diaphoresis 08/07/2013  . Type 2 diabetes mellitus with complications 21/30/8657  . Medically noncompliant 10/25/2011  . Cellulitis and abscess rt groin 10/25/2011  . CAD S/P percutaneous coronary angioplasty 10/25/2011  . MURMUR 04/16/2009  . AODM 08/02/2008  . Essential hypertension, benign 08/02/2008  . Hyperlipidemia associated with type 2 diabetes mellitus 07/31/2008  . DISORDERS OF IRON METABOLISM 07/31/2008  .  CHEST PAIN-UNSPECIFIED 07/31/2008  . DISORDERS OF IRON METABOLISM 07/31/2008    LABS    Component Value Date/Time   NA 139 08/13/2014 1705   NA 134* 07/30/2014 1000   NA 136 04/06/2014 1130   NA 136 12/20/2013 1319   NA 133* 08/09/2013 0441   NA 137 08/08/2013 1200   K 4.2 08/13/2014 1705   K 4.3 07/30/2014 1000   K 4.8 04/06/2014 1130   CL 97 08/13/2014 1705   CL 101 07/30/2014 1000   CL 95* 04/06/2014 1130   CO2 25 08/13/2014 1705   CO2 26 07/30/2014 1000   CO2 23 04/06/2014 1130   GLUCOSE 190* 08/13/2014 1705   GLUCOSE 228* 07/30/2014 1000   GLUCOSE 290* 04/06/2014 1130   GLUCOSE 265* 12/20/2013 1319   GLUCOSE 279* 08/09/2013 0441   GLUCOSE  142* 08/08/2013 1200   BUN 17 08/13/2014 1705   BUN 18 07/30/2014 1000   BUN 20 04/06/2014 1130   BUN 32* 12/20/2013 1319   BUN 20 08/09/2013 0441   BUN 26* 08/08/2013 1200   CREATININE 0.77 08/13/2014 1705   CREATININE 0.86 07/30/2014 1000   CREATININE 1.19 04/06/2014 1130   CALCIUM 9.5 08/13/2014 1705   CALCIUM 9.4 07/30/2014 1000   CALCIUM 10.3* 04/06/2014 1130   GFRNONAA 97 08/13/2014 1705   GFRNONAA >90 07/30/2014 1000   GFRNONAA 65 04/06/2014 1130   GFRAA 112 08/13/2014 1705   GFRAA >90 07/30/2014 1000   GFRAA 75 04/06/2014 1130   CMP     Component Value Date/Time   NA 139 08/13/2014 1705   NA 134* 07/30/2014 1000   K 4.2 08/13/2014 1705   CL 97 08/13/2014 1705   CO2 25 08/13/2014 1705   GLUCOSE 190* 08/13/2014 1705   GLUCOSE 228* 07/30/2014 1000   BUN 17 08/13/2014 1705   BUN 18 07/30/2014 1000   CREATININE 0.77 08/13/2014 1705   CALCIUM 9.5 08/13/2014 1705   PROT 6.7 08/13/2014 1705   PROT 7.3 07/30/2014 1000   ALBUMIN 3.9 07/30/2014 1000   AST 15 08/13/2014 1705   ALT 10 08/13/2014 1705   ALKPHOS 86 08/13/2014 1705   BILITOT 0.3 08/13/2014 1705   BILITOT 0.6 07/30/2014 1000   GFRNONAA 97 08/13/2014 1705   GFRAA 112 08/13/2014 1705       Component Value Date/Time   WBC 10.8* 08/13/2014 1722   WBC 9.6 07/30/2014 1000   WBC 9.5 04/06/2014 1130   WBC 11.1* 12/20/2013 1341   WBC 9.9 08/09/2013 0441   WBC 11.3* 08/08/2013 0230   WBC 9.8 05/31/2013 1132   HGB 15.9 08/13/2014 1722   HGB 16.4 07/30/2014 1000   HGB 16.1 04/06/2014 1130   HGB 15.3 12/20/2013 1341   HGB 16.0 05/31/2013 1132   HCT 51.9 08/13/2014 1722   HCT 46.8 07/30/2014 1000   HCT 48.2 04/06/2014 1130   HCT 43.9 12/20/2013 1341   HCT 50.2 05/31/2013 1132   MCV 86.2 08/13/2014 1722   MCV 84.6 07/30/2014 1000   MCV 88 04/06/2014 1130   MCV 86 12/20/2013 1341   MCV 84.2 05/31/2013 1132    Lipid Panel     Component Value Date/Time   CHOL 178 07/31/2014 0607   CHOL 219* 05/31/2013  1039   TRIG 511* 07/31/2014 0607   HDL 26* 07/31/2014 0607   HDL 22* 05/31/2013 1039   CHOLHDL 6.8 07/31/2014 0607   CHOLHDL 10.0* 05/31/2013 1039   VLDL UNABLE TO CALCULATE IF TRIGLYCERIDE OVER 400 mg/dL 07/31/2014 6295  LDLCALC UNABLE TO CALCULATE IF TRIGLYCERIDE OVER 400 mg/dL 07/31/2014 0607   LDLCALC Comment 05/31/2013 1039    ABG    Component Value Date/Time   HCO3 25.5* 08/07/2013 1851   TCO2 27 08/07/2013 1851   O2SAT 58.0 08/07/2013 1851     Lab Results  Component Value Date   TSH 1.160 12/20/2013   BNP (last 3 results) No results for input(s): BNP in the last 8760 hours.  ProBNP (last 3 results) No results for input(s): PROBNP in the last 8760 hours.  Cardiac Panel (last 3 results) No results for input(s): CKTOTAL, CKMB, TROPONINI, RELINDX in the last 72 hours.  Iron/TIBC/Ferritin/ %Sat No results found for: IRON, TIBC, FERRITIN, IRONPCTSAT   EKG Orders placed or performed during the hospital encounter of 07/30/14  . ED EKG  . ED EKG  . EKG 12-Lead  . EKG 12-Lead  . EKG 12-Lead (at 6am)  . EKG 12-Lead (at 6am)  . EKG     Prior Assessment and Plan Problem List as of 08/16/2014      Cardiovascular and Mediastinum   Essential hypertension, benign   CAD S/P percutaneous coronary angioplasty   Syncope     Endocrine   AODM   Type 2 diabetes mellitus with complications     Musculoskeletal and Integument   Cellulitis and abscess rt groin   Diaphoresis   DDD (degenerative disc disease), cervical     Other   Chest pain   Hyperlipidemia associated with type 2 diabetes mellitus   DISORDERS OF IRON METABOLISM   MURMUR   CHEST PAIN-UNSPECIFIED   DISORDERS OF IRON METABOLISM   Medically noncompliant   Headache   Obesity (BMI 30.0-34.9)   Low serum testosterone level       Imaging: Dg Chest 2 View  07/30/2014   CLINICAL DATA:  Mid chest pain intermittently since last night.  EXAM: CHEST  2 VIEW  COMPARISON:  02/05/2014  FINDINGS: The  cardiomediastinal silhouette is within normal limits. The lungs are well inflated and clear. There is no evidence of pleural effusion or pneumothorax. No acute osseous abnormality is identified.  IMPRESSION: No active cardiopulmonary disease.   Electronically Signed   By: Logan Bores   On: 07/30/2014 11:17   Dg Abd 1 View  08/14/2014   CLINICAL DATA:  Two week history of abdominal pain  EXAM: ABDOMEN - 1 VIEW  COMPARISON:  None.  FINDINGS: There is moderate stool throughout the colon. There is no bowel dilatation or air-fluid level suggesting obstruction. No free air is appreciable on this supine examination. There is vascular calcification in the pelvis.  IMPRESSION: Overall bowel gas pattern unremarkable.  Moderate stool in colon.   Electronically Signed   By: Lowella Grip III M.D.   On: 08/14/2014 07:53

## 2014-08-16 NOTE — Patient Instructions (Signed)
Your physician wants you to follow-up in: 6 months with Kathryn Lawrence, NP. You will receive a reminder letter in the mail two months in advance. If you don't receive a letter, please call our office to schedule the follow-up appointment.  Your physician recommends that you continue on your current medications as directed. Please refer to the Current Medication list given to you today.  Thank you for choosing [facility]!   

## 2014-08-17 ENCOUNTER — Telehealth: Payer: Self-pay | Admitting: Pharmacist

## 2014-08-17 ENCOUNTER — Ambulatory Visit (HOSPITAL_COMMUNITY)
Admission: RE | Admit: 2014-08-17 | Discharge: 2014-08-17 | Disposition: A | Discharge status: Home / Self Care | Payer: Medicare Other | Source: Ambulatory Visit | Attending: Family Medicine | Admitting: Family Medicine

## 2014-08-17 DIAGNOSIS — R140 Abdominal distension (gaseous): Secondary | ICD-10-CM

## 2014-08-17 DIAGNOSIS — M549 Dorsalgia, unspecified: Secondary | ICD-10-CM | POA: Insufficient documentation

## 2014-08-17 DIAGNOSIS — R109 Unspecified abdominal pain: Secondary | ICD-10-CM | POA: Diagnosis not present

## 2014-08-17 DIAGNOSIS — K4020 Bilateral inguinal hernia, without obstruction or gangrene, not specified as recurrent: Secondary | ICD-10-CM | POA: Diagnosis not present

## 2014-08-17 MED ORDER — LISINOPRIL 10 MG PO TABS: 10.0000 mg | ORAL_TABLET | Freq: Every day | ORAL | Status: DC

## 2014-08-17 NOTE — Telephone Encounter (Signed)
Rx sent electronically.  

## 2014-08-20 ENCOUNTER — Encounter: Payer: Self-pay | Admitting: Internal Medicine

## 2014-08-23 ENCOUNTER — Telehealth: Payer: Self-pay | Admitting: Pharmacist

## 2014-08-24 NOTE — Telephone Encounter (Signed)
Patient has had more depressed mood since starting wellbutrin.   I recommended he stop wellbutrin.  He has follow up with Dr Livia Snellen on Monday.   He is advised to call 911 if he has any thoughts of hurting self or someone else.

## 2014-08-27 ENCOUNTER — Ambulatory Visit (INDEPENDENT_AMBULATORY_CARE_PROVIDER_SITE_OTHER): Payer: Medicare Other | Admitting: Family Medicine

## 2014-08-27 ENCOUNTER — Encounter: Payer: Self-pay | Admitting: Family Medicine

## 2014-08-27 VITALS — BP 166/82 | HR 58 | Temp 97.8°F | Ht 75.0 in | Wt 273.4 lb

## 2014-08-27 DIAGNOSIS — E1169 Type 2 diabetes mellitus with other specified complication: Secondary | ICD-10-CM

## 2014-08-27 DIAGNOSIS — R1013 Epigastric pain: Secondary | ICD-10-CM | POA: Diagnosis not present

## 2014-08-27 DIAGNOSIS — I10 Essential (primary) hypertension: Secondary | ICD-10-CM

## 2014-08-27 DIAGNOSIS — E785 Hyperlipidemia, unspecified: Secondary | ICD-10-CM | POA: Diagnosis not present

## 2014-08-27 DIAGNOSIS — K5732 Diverticulitis of large intestine without perforation or abscess without bleeding: Secondary | ICD-10-CM

## 2014-08-27 DIAGNOSIS — Z794 Long term (current) use of insulin: Secondary | ICD-10-CM

## 2014-08-27 MED ORDER — METRONIDAZOLE 500 MG PO TABS
500.0000 mg | ORAL_TABLET | Freq: Two times a day (BID) | ORAL | Status: DC
Start: 2014-08-27 — End: 2014-10-04

## 2014-08-27 MED ORDER — CIPROFLOXACIN HCL 500 MG PO TABS: 500.0000 mg | ORAL_TABLET | Freq: Two times a day (BID) | ORAL | Status: DC

## 2014-08-27 NOTE — Progress Notes (Addendum)
Subjective:  Patient ID: [name] [name], male    DOB: March 21, 1951  Age: 64 y.o. MRN: [id number]  CC: Diabetes; Hypertension; Hyperlipidemia; and Abdominal Pain   HPI [facility] presents for  follow-up of hypertension. Patient has no history of headache chest pain or shortness of breath or recent cough. Patient also denies symptoms of TIA such as numbness weakness lateralizing. Patient checks  blood pressure at home and has not had any elevated readings recently. Patient denies side effects from his medication. States taking it regularly.   Patient in for follow-up of elevated cholesterol. Doing well without complaints on current medication. Denies side effects of statin including myalgia and arthralgia and nausea. Also in today for liver function testing. Currently no chest pain, shortness of breath or other cardiovascular related symptoms noted.  Follow-up of diabetes. Patient does  check blood sugar at home Patient denies symptoms such as polyuria, polydipsia, excessive hunger, nausea  Several significant hypoglycemic spells noted dropped to 48 Seval dications as noted below. Taking them regularly without complication/adverse reaction being reported today.   Abdominal pain today all over and LUQ. Squeezing and pressure. Increased gas.. Onset 3 weeks Getting worse until omeprazole increased to40 BID. Foods such as Tomatoes,  History [place] has a past medical history of Diabetes mellitus; Hypertension; GERD (gastroesophageal reflux disease); Arthritis; CAD (coronary artery disease); AODM; Disorders of iron metabolism; MURMUR; CHEST PAIN-UNSPECIFIED; DISORDERS OF IRON METABOLISM; Medically noncompliant; Cellulitis and abscess rt groin; Hyperlipidemia; and Low serum testosterone level.   He has past surgical history that includes Coronary stent placement (2000); Hernia repair (2000); Back surgery (2015); and Lesion removal.   His family history includes Alzheimer's disease in his mother; Arthritis  in his father, mother, and sister; Cancer in his mother and paternal uncle; Dementia in his maternal aunt and maternal uncle; Diabetes in his father and sister; Heart disease in his father and maternal uncle; Hyperlipidemia in his mother and sister; Hypertension in his mother and sister; Valvular heart disease in his father.He reports that he quit smoking about 3 weeks ago. His smoking use included Cigarettes. He has a 5.1 pack-year smoking history. He has never used smokeless tobacco. He reports that he does not drink alcohol or use illicit drugs.  Current Outpatient Prescriptions on File Prior to Visit  Medication Sig Dispense Refill  . aspirin EC 81 MG tablet Take 81 mg by mouth daily.    . carbidopa-levodopa (SINEMET CR) 50-200 MG per tablet Take 1 tablet by mouth daily.     Marland Kitchen gabapentin (NEURONTIN) 300 MG capsule Take 1 capsule (300 mg total) by mouth 3 (three) times daily. (Patient taking differently: Take 300 mg by mouth at bedtime. ) 90 capsule 11  . insulin NPH-regular Human (NOVOLIN 70/30 RELION) (70-30) 100 UNIT/ML injection Inject 75 units twice a day (Patient taking differently: Inject 64 Units into the skin 2 (two) times daily with a meal. ) 50 mL 2  . isosorbide mononitrate (IMDUR) 30 MG 24 hr tablet Take 0.5 tablets (15 mg total) by mouth daily. 30 tablet 2  . lisinopril (PRINIVIL,ZESTRIL) 10 MG tablet Take 1 tablet (10 mg total) by mouth daily. 90 tablet 1  . meloxicam (MOBIC) 15 MG tablet     . metoprolol tartrate (LOPRESSOR) 25 MG tablet Take 0.5 tablets (12.5 mg total) by mouth 2 (two) times daily. 90 tablet 0  . omeprazole (PRILOSEC) 40 MG capsule One twice daily half an hour before breakfast and supper (Patient taking differently: 40 mg 2 (  two) times daily before a meal. One twice daily half an hour before breakfast and supper) 60 capsule 3  . sitaGLIPtin (JANUVIA) 100 MG tablet Take 1 tablet (100 mg total) by mouth daily. 35 tablet 0   No current facility-administered  medications on file prior to visit.    ROS Review of Systems  Constitutional: Negative for fever, chills, diaphoresis and unexpected weight change.  HENT: Negative for congestion, hearing loss, rhinorrhea, sore throat and trouble swallowing.   Respiratory: Negative for cough, chest tightness, shortness of breath and wheezing.   Gastrointestinal: Positive for abdominal pain (Has left lower quadrant pain that is moderate in intensity and intermittent. Having irregular bowel movements. No diarrhea or hematochezia. The epigastric tenderness has improved). Negative for nausea, vomiting, diarrhea, constipation and abdominal distention.  Endocrine: Negative for cold intolerance and heat intolerance.  Genitourinary: Negative for dysuria, hematuria and flank pain.  Musculoskeletal: Negative for joint swelling and arthralgias.  Skin: Negative for rash.  Neurological: Negative for dizziness and headaches.  Psychiatric/Behavioral: Negative for dysphoric mood, decreased concentration and agitation. The patient is not nervous/anxious.     Objective:  BP 166/82 mmHg  Pulse 58  Temp(Src) 97.8 F (36.6 C) (Oral)  Ht 6\' 3"  (1.905 m)  Wt 273 lb 6.4 oz (124.013 kg)  BMI 34.17 kg/m2  BP Readings from Last 3 Encounters:  08/27/14 166/82  08/16/14 128/78  08/13/14 180/86    Wt Readings from Last 3 Encounters:  08/27/14 273 lb 6.4 oz (124.013 kg)  08/16/14 270 lb (122.471 kg)  08/13/14 276 lb (125.193 kg)     Physical Exam  Constitutional: He is oriented to person, place, and time. He appears well-developed and well-nourished. No distress.  HENT:  Head: Normocephalic and atraumatic.  Right Ear: External ear normal.  Left Ear: External ear normal.  Nose: Nose normal.  Mouth/Throat: Oropharynx is clear and moist.  Eyes: Conjunctivae and EOM are normal. Pupils are equal, round, and reactive to light.  Neck: Normal range of motion. Neck supple. No thyromegaly present.  Cardiovascular: Normal  rate, regular rhythm and normal heart sounds.   No murmur heard. Pulmonary/Chest: Effort normal and breath sounds normal. No respiratory distress. He has no wheezes. He has no rales.  Abdominal: Soft. Bowel sounds are normal. He exhibits no distension and no mass. There is tenderness (moderate left lower quadrantNontender at the epigastrium and elsewhere). There is no rebound and no guarding.  Lymphadenopathy:    He has no cervical adenopathy.  Neurological: He is alert and oriented to person, place, and time. He has normal reflexes.  Skin: Skin is warm and dry.  Psychiatric: He has a normal mood and affect. His behavior is normal. Judgment and thought content normal.    Lab Results  Component Value Date   HGBA1C 9.2* 07/31/2014   HGBA1C 8.9 04/06/2014   HGBA1C 12.3% 12/20/2013    Lab Results  Component Value Date   WBC 10.8* 08/13/2014   HGB 15.9 08/13/2014   HCT 51.9 08/13/2014   PLT 209 07/30/2014   GLUCOSE 190* 08/13/2014   CHOL 178 07/31/2014   TRIG 511* 07/31/2014   HDL 26* 07/31/2014   LDLCALC UNABLE TO CALCULATE IF TRIGLYCERIDE OVER 400 mg/dL 07/31/2014   ALT 10 08/13/2014   AST 15 08/13/2014   NA 139 08/13/2014   K 4.2 08/13/2014   CL 97 08/13/2014   CREATININE 0.77 08/13/2014   BUN 17 08/13/2014   CO2 25 08/13/2014   TSH 1.160 12/20/2013   PSA  1.1 05/31/2013   INR 0.99 10/25/2011   HGBA1C 9.2* 07/31/2014    Ct Abdomen Pelvis Wo Contrast  08/17/2014   CLINICAL DATA:  Initial encounter for left-sided abdominal pain for 2 week duration with sharp back pain beginning about 2 days ago.  EXAM: CT ABDOMEN AND PELVIS WITHOUT CONTRAST  TECHNIQUE: Multidetector CT imaging of the abdomen and pelvis was performed following the standard protocol without IV contrast.  COMPARISON:  None.  FINDINGS: Lower chest:  UNREMARKABLE.  Hepatobiliary: No focal abnormality in the liver on this study without intravenous contrast. No evidence for hepatomegaly. There is no evidence for  gallstones, gallbladder wall thickening, or pericholecystic fluid. No intrahepatic or extrahepatic biliary dilation.  Pancreas: No focal mass lesion. No dilatation of the main duct. No intraparenchymal cyst. No peripancreatic edema.  Spleen: No splenomegaly. No focal mass lesion.  Adrenals/Urinary Tract: No adrenal nodule or mass. No evidence for kidney stones. No secondary changes in either kidney. No gross renal mass visualized on this study without intravenous contrast. No hydroureter. Urinary bladder is unremarkable.  Stomach/Bowel: Stomach is nondistended. No gastric wall thickening. No evidence of outlet obstruction. Duodenum is normally positioned as is the ligament of Treitz. No small bowel wall thickening. No small bowel dilatation. Terminal ileum is normal. Appendix is normal. Diverticular changes are noted in the left colon without evidence of diverticulitis.  Vascular/Lymphatic: There is abdominal aortic atherosclerosis without aneurysm. No abdominal lymphadenopathy. No pelvic sidewall lymphadenopathy.  Reproductive: Prostate gland and seminal vesicles are unremarkable.  Other: No intraperitoneal free fluid.  Musculoskeletal: Bilateral inguinal hernias, right greater than left, contain only fat. Bone windows reveal no worrisome lytic or sclerotic osseous lesions.  IMPRESSION: 1. No CT findings to explain the patient's history of left side abdominal and back pain. 2. Bilateral inguinal hernias contain only fat. Imaging findings of potential clinical significance:  Aortic Atherosclerosis (ICD10-170.0)   Electronically Signed   By: Misty Stanley M.D.   On: 08/17/2014 09:15    Assessment & Plan:   [place] was seen today for diabetes, hypertension, hyperlipidemia and abdominal pain.  Diagnoses and all orders for this visit:  Long term current use of insulin  Essential hypertension, benign  Hyperlipidemia associated with type 2 diabetes mellitus  Abdominal pain, epigastric  Diverticulitis of large  intestine without perforation or abscess without bleeding  Other orders -     metroNIDAZOLE (FLAGYL) 500 MG tablet; Take 1 tablet (500 mg total) by mouth 2 (two) times daily. -     ciprofloxacin (CIPRO) 500 MG tablet; Take 1 tablet (500 mg total) by mouth 2 (two) times daily.   I am having Mr. Breon start on metroNIDAZOLE and ciprofloxacin. I am also having him maintain his aspirin EC, gabapentin, sitaGLIPtin, isosorbide mononitrate, metoprolol tartrate, omeprazole, insulin NPH-regular Human, meloxicam, carbidopa-levodopa, and lisinopril.  Meds ordered this encounter  Medications  . metroNIDAZOLE (FLAGYL) 500 MG tablet    Sig: Take 1 tablet (500 mg total) by mouth 2 (two) times daily.    Dispense:  20 tablet    Refill:  0  . ciprofloxacin (CIPRO) 500 MG tablet    Sig: Take 1 tablet (500 mg total) by mouth 2 (two) times daily.    Dispense:  20 tablet    Refill:  0     Follow-up: Return in about 3 months (around 11/26/2014).  Claretta Fraise, M.D.

## 2014-08-27 NOTE — Addendum Note (Signed)
Addended by: Claretta Fraise on: 08/27/2014 09:34 AM   Modules accepted: Orders

## 2014-08-27 NOTE — Patient Instructions (Signed)
Metamucil 1 tablespoon twice daily for fiber to relieve abdominal discomfort and regulate stool.  When excessively constipated you can use MiraLAX in addition to the Metamucil but it is only as needed where as the Metamucil should be on a regular basis.

## 2014-09-06 ENCOUNTER — Encounter: Payer: Self-pay | Admitting: Gastroenterology

## 2014-09-06 ENCOUNTER — Ambulatory Visit (INDEPENDENT_AMBULATORY_CARE_PROVIDER_SITE_OTHER): Payer: Medicare Other | Admitting: Gastroenterology

## 2014-09-06 VITALS — BP 141/83 | HR 80 | Temp 98.0°F | Ht 74.0 in | Wt 270.8 lb

## 2014-09-06 DIAGNOSIS — R6881 Early satiety: Secondary | ICD-10-CM | POA: Insufficient documentation

## 2014-09-06 DIAGNOSIS — K219 Gastro-esophageal reflux disease without esophagitis: Secondary | ICD-10-CM | POA: Insufficient documentation

## 2014-09-06 DIAGNOSIS — R140 Abdominal distension (gaseous): Secondary | ICD-10-CM | POA: Insufficient documentation

## 2014-09-06 DIAGNOSIS — R1084 Generalized abdominal pain: Secondary | ICD-10-CM | POA: Diagnosis not present

## 2014-09-06 DIAGNOSIS — R109 Unspecified abdominal pain: Secondary | ICD-10-CM | POA: Insufficient documentation

## 2014-09-06 NOTE — Progress Notes (Signed)
Primary Care Physician:  Redge Gainer, MD  Primary Gastroenterologist:  Garfield Cornea, MD   Chief Complaint  Patient presents with  . Abdominal Pain    HPI:  [name] [name] is a 64 y.o. male here at the request of Dr. Redge Gainer for further evaluation of abdominal pain. Patient gives 1-2 month history of abdominal discomfort. Symptoms associated with bloating and gas. Moderate to severe in nature. Pain predominantly in the upper abdomen but he tells me that his pain migrates and sometimes in the lower abdomen as well. May be worse in the left upper quadrant and left midabdomen. Sometimes wakes up with the pain. Sometimes some improvement after bowel movements. Associated early satiety but no vomiting. He has stopped consuming dairy products at the request of his PCP. Really no noted improvement. Recently had a CT of the abdomen and pelvis without contrast which failed to explain his symptoms. Completing 10 day course of Cipro and Flagyl which was given empirically for possible diverticulitis. His Prilosec was also increased to 40 mg twice a day with some slight improvement of the symptoms. Denies any typical heartburn on this regimen. Notes he was told to stop Aleve which was helping him for arthritic pain. He believes he is still taking Modic. Tells me he was given carbidopa-levodopa one year ago for "abdominal quivers". Regarding bowel function, chronically has 2-3 stools daily, solid. No melena rectal bleeding. Weight is down 6 pounds.  Recent hospitalization in March with atypical chest pain, possibly related to GERD. Cardiology evaluation unremarkable.  No prior EGD/TCS.  Patient tells me that he has a shellfish allergy. Tongue swelling, hives. States years ago he was told not to take iodine products due to this. To his knowledge he has never received IV contrast for CTs.     Current Outpatient Prescriptions  Medication Sig Dispense Refill  . aspirin EC 81 MG tablet Take 81 mg by mouth  daily.    . carbidopa-levodopa (SINEMET CR) 50-200 MG per tablet Take 1 tablet by mouth daily.     . ciprofloxacin (CIPRO) 500 MG tablet Take 1 tablet (500 mg total) by mouth 2 (two) times daily. 20 tablet 0  . gabapentin (NEURONTIN) 300 MG capsule Take 1 capsule (300 mg total) by mouth 3 (three) times daily. (Patient taking differently: Take 300 mg by mouth at bedtime. ) 90 capsule 11  . insulin NPH-regular Human (NOVOLIN 70/30 RELION) (70-30) 100 UNIT/ML injection Inject 75 units twice a day (Patient taking differently: Inject 72 Units into the skin 2 (two) times daily with a meal. ) 50 mL 2  . isosorbide mononitrate (IMDUR) 30 MG 24 hr tablet Take 0.5 tablets (15 mg total) by mouth daily. 30 tablet 2  . lisinopril (PRINIVIL,ZESTRIL) 10 MG tablet Take 1 tablet (10 mg total) by mouth daily. 90 tablet 1  . meloxicam (MOBIC) 15 MG tablet     . metoprolol tartrate (LOPRESSOR) 25 MG tablet Take 0.5 tablets (12.5 mg total) by mouth 2 (two) times daily. 90 tablet 0  . metroNIDAZOLE (FLAGYL) 500 MG tablet Take 1 tablet (500 mg total) by mouth 2 (two) times daily. 20 tablet 0  . omeprazole (PRILOSEC) 40 MG capsule One twice daily half an hour before breakfast and supper (Patient taking differently: 40 mg 2 (two) times daily before a meal. One twice daily half an hour before breakfast and supper) 60 capsule 3  . sitaGLIPtin (JANUVIA) 100 MG tablet Take 1 tablet (100 mg total) by mouth daily. 35 tablet  0   No current facility-administered medications for this visit.    Allergies as of 09/06/2014 - Review Complete 09/06/2014  Allergen Reaction Noted  . Ace inhibitors Other (See Comments) and Cough 08/09/2013  . Invokana [canagliflozin] Other (See Comments) 09/04/2013  . Metformin and related Itching 07/12/2013  . Horse-derived products  07/25/2008  . Crestor [rosuvastatin] Other (See Comments) 06/05/2013  . Lipitor [atorvastatin] Other (See Comments) 06/05/2013    Past Medical History  Diagnosis  Date  . Diabetes mellitus   . Hypertension   . GERD (gastroesophageal reflux disease)   . Arthritis   . CAD (coronary artery disease)     2010  LAD 50%, RCA 100%.  DES to RCA.  EF 55%  . AODM   . Disorders of iron metabolism   . MURMUR   . CHEST PAIN-UNSPECIFIED   . DISORDERS OF IRON METABOLISM   . Medically noncompliant   . Cellulitis and abscess rt groin   . Hyperlipidemia   . Low serum testosterone level     Past Surgical History  Procedure Laterality Date  . Coronary stent placement  2000    By Dr. Olevia Perches  . Hernia repair  7741    umbilical  . Back surgery  2015    ACDF by Dr. [name]  . Lesion removal      Lip and hand   . Esophagogastroduodenoscopy      esophagus stretched remotely at [facility] History  Problem Relation Age of Onset  . Diabetes Father   . Valvular heart disease Father   . Arthritis Father   . Heart disease Father   . Alzheimer's disease Mother   . Hyperlipidemia Mother   . Hypertension Mother   . Cancer Mother     lung  . Arthritis Mother   . Arthritis Sister     rheumatoid  . Dementia Maternal Aunt   . Dementia Maternal Uncle   . Heart disease Maternal Uncle   . Cancer Paternal Uncle     stomach  . Diabetes Sister   . Hypertension Sister   . Hyperlipidemia Sister   . Colon cancer Neg Hx   . Liver disease Neg Hx     History   Social History  . Marital Status: Married    Spouse Name: N/A  . Number of Children: 2  . Years of Education: N/A   Occupational History  . Not on file.   Social History Main Topics  . Smoking status: Former Smoker -- 0.10 packs/day for 51 years    Types: Cigarettes    Quit date: 08/02/2014  . Smokeless tobacco: Never Used  . Alcohol Use: No     Comment: heavy in past, none in 10 years.   . Drug Use: No  . Sexual Activity: Yes   Other Topics Concern  . Not on file   Social History Narrative   Lives alone.  Disabled.  Unable to afford medicines.        ROS:  General: Negative for  anorexia,  fever, chills, fatigue, weakness. See hpi. Eyes: Negative for vision changes.  ENT: Negative for hoarseness, difficulty swallowing , nasal congestion. CV: Negative for chest pain, angina, palpitations, dyspnea on exertion, peripheral edema.  Respiratory: Negative for dyspnea at rest, dyspnea on exertion, cough, sputum, wheezing.  GI: See history of present illness. GU:  Negative for dysuria, hematuria, urinary incontinence, urinary frequency, nocturnal urination.  MS: Negative for joint pain. + low back pain.  Derm:  Negative for rash or itching.  Neuro: Negative for weakness, abnormal sensation, seizure, frequent headaches, memory loss, confusion.  Psych: Negative for anxiety, depression, suicidal ideation, hallucinations.  Endo: Negative for unusual weight change.  Heme: Negative for bruising or bleeding. Allergy: Negative for rash or hives.    Physical Examination:  BP 141/83 mmHg  Pulse 80  Temp(Src) 98 F (36.7 C) (Oral)  Ht 6\' 2"  (1.88 m)  Wt 270 lb 12.8 oz (122.834 kg)  BMI 34.75 kg/m2   General: Well-nourished, well-developed in no acute distress. Accompanied by wife.  Head: Normocephalic, atraumatic.   Eyes: Conjunctiva pink, no icterus. Mouth: Oropharyngeal mucosa moist and pink , no lesions erythema or exudate. Neck: Supple without thyromegaly, masses, or lymphadenopathy.  Lungs: Clear to auscultation bilaterally.  Heart: Regular rate and rhythm, no murmurs rubs or gallops.  Abdomen: Bowel sounds are normal, abd obese. Diffuse upper abd tenderness and over left lower rib cage margin, no hepatosplenomegaly or masses, no abdominal bruits or    hernia , no rebound or guarding.   Rectal: not perfformed Extremities: No lower extremity edema. No clubbing or deformities.  Neuro: Alert and oriented x 4 , grossly normal neurologically.  Skin: Warm and dry, no rash or jaundice.   Psych: Alert and cooperative, normal mood and affect.  Labs: Lab Results  Component  Value Date   ALT 10 08/13/2014   AST 15 08/13/2014   ALKPHOS 86 08/13/2014   BILITOT 0.3 08/13/2014   Lab Results  Component Value Date   CREATININE 0.77 08/13/2014   BUN 17 08/13/2014   NA 139 08/13/2014   K 4.2 08/13/2014   CL 97 08/13/2014   CO2 25 08/13/2014   Lab Results  Component Value Date   WBC 10.8* 08/13/2014   HGB 15.9 08/13/2014   HCT 51.9 08/13/2014   MCV 86.2 08/13/2014   PLT 209 07/30/2014   Lab Results  Component Value Date   HGBA1C 9.2* 07/31/2014     Imaging Studies: Ct Abdomen Pelvis Wo Contrast  08/17/2014   CLINICAL DATA:  Initial encounter for left-sided abdominal pain for 2 week duration with sharp back pain beginning about 2 days ago.  EXAM: CT ABDOMEN AND PELVIS WITHOUT CONTRAST  TECHNIQUE: Multidetector CT imaging of the abdomen and pelvis was performed following the standard protocol without IV contrast.  COMPARISON:  None.  FINDINGS: Lower chest:  UNREMARKABLE.  Hepatobiliary: No focal abnormality in the liver on this study without intravenous contrast. No evidence for hepatomegaly. There is no evidence for gallstones, gallbladder wall thickening, or pericholecystic fluid. No intrahepatic or extrahepatic biliary dilation.  Pancreas: No focal mass lesion. No dilatation of the main duct. No intraparenchymal cyst. No peripancreatic edema.  Spleen: No splenomegaly. No focal mass lesion.  Adrenals/Urinary Tract: No adrenal nodule or mass. No evidence for kidney stones. No secondary changes in either kidney. No gross renal mass visualized on this study without intravenous contrast. No hydroureter. Urinary bladder is unremarkable.  Stomach/Bowel: Stomach is nondistended. No gastric wall thickening. No evidence of outlet obstruction. Duodenum is normally positioned as is the ligament of Treitz. No small bowel wall thickening. No small bowel dilatation. Terminal ileum is normal. Appendix is normal. Diverticular changes are noted in the left colon without evidence of  diverticulitis.  Vascular/Lymphatic: There is abdominal aortic atherosclerosis without aneurysm. No abdominal lymphadenopathy. No pelvic sidewall lymphadenopathy.  Reproductive: Prostate gland and seminal vesicles are unremarkable.  Other: No intraperitoneal free fluid.  Musculoskeletal: Bilateral inguinal hernias, right greater than  left, contain only fat. Bone windows reveal no worrisome lytic or sclerotic osseous lesions.  IMPRESSION: 1. No CT findings to explain the patient's history of left side abdominal and back pain. 2. Bilateral inguinal hernias contain only fat. Imaging findings of potential clinical significance:  Aortic Atherosclerosis (ICD10-170.0)   Electronically Signed   By: Misty Stanley M.D.   On: 08/17/2014 09:15   Dg Abd 1 View  08/14/2014   CLINICAL DATA:  Two week history of abdominal pain  EXAM: ABDOMEN - 1 VIEW  COMPARISON:  None.  FINDINGS: There is moderate stool throughout the colon. There is no bowel dilatation or air-fluid level suggesting obstruction. No free air is appreciable on this supine examination. There is vascular calcification in the pelvis.  IMPRESSION: Overall bowel gas pattern unremarkable.  Moderate stool in colon.   Electronically Signed   By: Lowella Grip III M.D.   On: 08/14/2014 07:53

## 2014-09-06 NOTE — Patient Instructions (Signed)
1. Start a probiotic daily for next four weeks. Align and [employer] either one are good. We did not have any samples. 2. I will discuss your case with Dr. [name]. We will call you tomorrow with further instructions.

## 2014-09-07 ENCOUNTER — Encounter: Payer: Self-pay | Admitting: Gastroenterology

## 2014-09-07 NOTE — Progress Notes (Signed)
Please let patient know recommendations are for EGD/TCS with Dr. [name].  Please ask him if he is having any swallowing problems with food. If so also add on possible esophageal dilation.   Day of prep: Januvia 50mg  daily Novolin (1/2 dose), which should be 35units bid with food.  Given phenergan 25mg  IV 30 minutes before procedure due to remote etoh use.

## 2014-09-07 NOTE — Assessment & Plan Note (Addendum)
64 year old gentleman with 1-2 month history of somewhat vague abdominal discomfort, migratory in nature but predominantly upper abdomen. Associated with abdominal bloating, early satiety, 6 pound weight loss. History of chronic GERD with recent hospitalization for atypical chest pain felt to be related to GI etiology. History of NSAID use. Discussed at length with patient today and his wife. He has multiple GI symptoms which may not be explained by 1 etiology. He has never had an upper endoscopy or colonoscopy. Really no known bowel issues at this time. Recent NONCONTRAST CT Abdomen/Pelvis without explanation for symptoms.   Patient needs upper endoscopy for evaluation of upper GI symptoms. Need to exclude peptic ulcer disease, gastritis, complicated GERD, malignancy. Discussed with patient today that we should consider colonoscopy predominantly for screening purposes. Also cannot exclude biliary etiology at this point.   EGD/colonoscopy with Dr. [name] in the near future.  I have discussed the risks, alternatives, benefits with regards to but not limited to the risk of reaction to medication, bleeding, infection, perforation and the patient is agreeable to proceed. Written consent to be obtained.  Augment conscious sedation with Phenergan 25mg  IV 30 minutes before the procedure due to remote etoh use.

## 2014-09-10 ENCOUNTER — Other Ambulatory Visit: Payer: Self-pay

## 2014-09-10 DIAGNOSIS — R6881 Early satiety: Secondary | ICD-10-CM

## 2014-09-10 DIAGNOSIS — R1084 Generalized abdominal pain: Secondary | ICD-10-CM

## 2014-09-10 DIAGNOSIS — R140 Abdominal distension (gaseous): Secondary | ICD-10-CM

## 2014-09-10 DIAGNOSIS — K219 Gastro-esophageal reflux disease without esophagitis: Secondary | ICD-10-CM

## 2014-09-10 MED ORDER — PEG 3350-KCL-NA BICARB-NACL 420 G PO SOLR: 4000.0000 mL | Freq: Once | ORAL | Status: DC

## 2014-09-10 NOTE — Progress Notes (Signed)
cc'ed to pcp °

## 2014-09-28 ENCOUNTER — Telehealth: Payer: Self-pay | Admitting: Adult Health

## 2014-09-28 NOTE — Telephone Encounter (Signed)
Will come for BP check on Monday and we will fu with K lawrence NP

## 2014-09-28 NOTE — Telephone Encounter (Signed)
Patient has questions about his medication changes. Wife left voicemail stating that he is experiencing some issues

## 2014-10-01 ENCOUNTER — Ambulatory Visit (HOSPITAL_COMMUNITY)
Admission: RE | Admit: 2014-10-01 | Discharge: 2014-10-01 | Disposition: A | Discharge status: Home / Self Care | Payer: Medicare Other | Source: Ambulatory Visit | Attending: Internal Medicine | Admitting: Internal Medicine

## 2014-10-01 ENCOUNTER — Encounter (HOSPITAL_COMMUNITY)
Admission: RE | Disposition: A | Discharge status: Home / Self Care | Payer: Self-pay | Source: Ambulatory Visit | Attending: Internal Medicine

## 2014-10-01 DIAGNOSIS — D128 Benign neoplasm of rectum: Secondary | ICD-10-CM | POA: Insufficient documentation

## 2014-10-01 DIAGNOSIS — K219 Gastro-esophageal reflux disease without esophagitis: Secondary | ICD-10-CM | POA: Diagnosis not present

## 2014-10-01 DIAGNOSIS — R140 Abdominal distension (gaseous): Secondary | ICD-10-CM

## 2014-10-01 DIAGNOSIS — D120 Benign neoplasm of cecum: Secondary | ICD-10-CM | POA: Insufficient documentation

## 2014-10-01 DIAGNOSIS — D129 Benign neoplasm of anus and anal canal: Secondary | ICD-10-CM

## 2014-10-01 DIAGNOSIS — D124 Benign neoplasm of descending colon: Secondary | ICD-10-CM | POA: Insufficient documentation

## 2014-10-01 DIAGNOSIS — K5730 Diverticulosis of large intestine without perforation or abscess without bleeding: Secondary | ICD-10-CM | POA: Diagnosis not present

## 2014-10-01 DIAGNOSIS — K2950 Unspecified chronic gastritis without bleeding: Secondary | ICD-10-CM | POA: Diagnosis not present

## 2014-10-01 DIAGNOSIS — K3189 Other diseases of stomach and duodenum: Secondary | ICD-10-CM | POA: Insufficient documentation

## 2014-10-01 DIAGNOSIS — R1084 Generalized abdominal pain: Secondary | ICD-10-CM

## 2014-10-01 DIAGNOSIS — R6881 Early satiety: Secondary | ICD-10-CM

## 2014-10-01 DIAGNOSIS — Z1211 Encounter for screening for malignant neoplasm of colon: Secondary | ICD-10-CM

## 2014-10-01 DIAGNOSIS — D123 Benign neoplasm of transverse colon: Secondary | ICD-10-CM | POA: Diagnosis not present

## 2014-10-01 DIAGNOSIS — R1013 Epigastric pain: Secondary | ICD-10-CM | POA: Diagnosis present

## 2014-10-01 DIAGNOSIS — Z86010 Personal history of colonic polyps: Secondary | ICD-10-CM | POA: Insufficient documentation

## 2014-10-01 HISTORY — PX: COLONOSCOPY: SHX5424

## 2014-10-01 HISTORY — PX: ESOPHAGOGASTRODUODENOSCOPY: SHX5428

## 2014-10-01 LAB — GLUCOSE, CAPILLARY: Glucose-Capillary: 155 mg/dL — ABNORMAL HIGH (ref 65–99)

## 2014-10-01 SURGERY — COLONOSCOPY
Anesthesia: Moderate Sedation

## 2014-10-01 MED ORDER — MIDAZOLAM HCL 5 MG/5ML IJ SOLN
INTRAMUSCULAR | Status: AC
  Filled 2014-10-01: qty 10

## 2014-10-01 MED ORDER — PROMETHAZINE HCL 25 MG/ML IJ SOLN
INTRAMUSCULAR | Status: AC
  Filled 2014-10-01: qty 1

## 2014-10-01 MED ORDER — LIDOCAINE VISCOUS 2 % MT SOLN
OROMUCOSAL | Status: DC | PRN
  Administered 2014-10-01: 1 via OROMUCOSAL

## 2014-10-01 MED ORDER — MIDAZOLAM HCL 5 MG/5ML IJ SOLN
INTRAMUSCULAR | Status: DC | PRN
  Administered 2014-10-01: 1 mg via INTRAVENOUS
  Administered 2014-10-01: 2 mg via INTRAVENOUS
  Administered 2014-10-01 (×4): 1 mg via INTRAVENOUS

## 2014-10-01 MED ORDER — SODIUM CHLORIDE 0.9 % IJ SOLN
INTRAMUSCULAR | Status: AC
  Filled 2014-10-01: qty 3

## 2014-10-01 MED ORDER — STERILE WATER FOR IRRIGATION IR SOLN
Status: DC | PRN
  Administered 2014-10-01: 08:00:00

## 2014-10-01 MED ORDER — LIDOCAINE VISCOUS 2 % MT SOLN
OROMUCOSAL | Status: AC
  Filled 2014-10-01: qty 15

## 2014-10-01 MED ORDER — ONDANSETRON HCL 4 MG/2ML IJ SOLN
INTRAMUSCULAR | Status: DC | PRN
  Administered 2014-10-01: 4 mg via INTRAVENOUS

## 2014-10-01 MED ORDER — PROMETHAZINE HCL 25 MG/ML IJ SOLN
25.0000 mg | Freq: Once | INTRAMUSCULAR | Status: AC
Start: 2014-10-01 — End: 2014-10-01
  Administered 2014-10-01: 25 mg via INTRAVENOUS

## 2014-10-01 MED ORDER — SODIUM CHLORIDE 0.9 % IV SOLN
INTRAVENOUS | Status: DC
  Administered 2014-10-01: 08:00:00 via INTRAVENOUS

## 2014-10-01 MED ORDER — MEPERIDINE HCL 100 MG/ML IJ SOLN
INTRAMUSCULAR | Status: AC
  Filled 2014-10-01: qty 2

## 2014-10-01 MED ORDER — ONDANSETRON HCL 4 MG/2ML IJ SOLN
INTRAMUSCULAR | Status: AC
  Filled 2014-10-01: qty 2

## 2014-10-01 MED ORDER — MEPERIDINE HCL 100 MG/ML IJ SOLN
INTRAMUSCULAR | Status: DC | PRN
  Administered 2014-10-01: 25 mg via INTRAVENOUS
  Administered 2014-10-01: 50 mg via INTRAVENOUS

## 2014-10-01 NOTE — H&P (View-Only) (Signed)
Please let patient know recommendations are for EGD/TCS with Dr. [name].  Please ask him if he is having any swallowing problems with food. If so also add on possible esophageal dilation.   Day of prep: Januvia 50mg  daily Novolin (1/2 dose), which should be 35units bid with food.  Given phenergan 25mg  IV 30 minutes before procedure due to remote etoh use.

## 2014-10-01 NOTE — Discharge Instructions (Signed)
Colonoscopy Discharge Instructions  Read the instructions outlined below and refer to this sheet in the next few weeks. These discharge instructions provide you with general information on caring for yourself after you leave the hospital. Your doctor may also give you specific instructions. While your treatment has been planned according to the most current medical practices available, unavoidable complications occasionally occur. If you have any problems or questions after discharge, call Dr. [name] at [phone]. ACTIVITY  You may resume your regular activity, but move at a slower pace for the next 24 hours.   Take frequent rest periods for the next 24 hours.   Walking will help get rid of the air and reduce the bloated feeling in your belly (abdomen).   No driving for 24 hours (because of the medicine (anesthesia) used during the test).    Do not sign any important legal documents or operate any machinery for 24 hours (because of the anesthesia used during the test).  NUTRITION  Drink plenty of fluids.   You may resume your normal diet as instructed by your doctor.   Begin with a light meal and progress to your normal diet. Heavy or fried foods are harder to digest and may make you feel sick to your stomach (nauseated).   Avoid alcoholic beverages for 24 hours or as instructed.  MEDICATIONS  You may resume your normal medications unless your doctor tells you otherwise.  WHAT YOU CAN EXPECT TODAY  Some feelings of bloating in the abdomen.   Passage of more gas than usual.   Spotting of blood in your stool or on the toilet paper.  IF YOU HAD POLYPS REMOVED DURING THE COLONOSCOPY:  No aspirin products for 7 days or as instructed.   No alcohol for 7 days or as instructed.   Eat a soft diet for the next 24 hours.  FINDING OUT THE RESULTS OF YOUR TEST Not all test results are available during your visit. If your test results are not back during the visit, make an appointment  with your caregiver to find out the results. Do not assume everything is normal if you have not heard from your caregiver or the medical facility. It is important for you to follow up on all of your test results.  SEEK IMMEDIATE MEDICAL ATTENTION IF:  You have more than a spotting of blood in your stool.   Your belly is swollen (abdominal distention).   You are nauseated or vomiting.   You have a temperature over 101.  You have abdominal pain or discomfort that is severe or gets worse throughout the day. EGD Discharge instructions Please read the instructions outlined below and refer to this sheet in the next few weeks. These discharge instructions provide you with general information on caring for yourself after you leave the hospital. Your doctor may also give you specific instructions. While your treatment has been planned according to the most current medical practices available, unavoidable complications occasionally occur. If you have any problems or questions after discharge, please call your doctor. ACTIVITY You may resume your regular activity but move at a slower pace for the next 24 hours.  Take frequent rest periods for the next 24 hours.  Walking will help expel (get rid of) the air and reduce the bloated feeling in your abdomen.  No driving for 24 hours (because of the anesthesia (medicine) used during the test).  You may shower.  Do not sign any important legal documents or operate any machinery for 24  hours (because of the anesthesia used during the test).  NUTRITION Drink plenty of fluids.  You may resume your normal diet.  Begin with a light meal and progress to your normal diet.  Avoid alcoholic beverages for 24 hours or as instructed by your caregiver.  MEDICATIONS You may resume your normal medications unless your caregiver tells you otherwise.  WHAT YOU CAN EXPECT TODAY You may experience abdominal discomfort such as a feeling of fullness or gas pains.   FOLLOW-UP Your doctor will discuss the results of your test with you.  SEEK IMMEDIATE MEDICAL ATTENTION IF ANY OF THE FOLLOWING OCCUR: Excessive nausea (feeling sick to your stomach) and/or vomiting.  Severe abdominal pain and distention (swelling).  Trouble swallowing.  Temperature over 101 F (37.8 C).  Rectal bleeding or vomiting of blood.      Diverticulosis and polyp information provided  Further recommendations to follow pending review of pathology report   Colon Polyps Polyps are lumps of extra tissue growing inside the body. Polyps can grow in the large intestine (colon). Most colon polyps are noncancerous (benign). However, some colon polyps can become cancerous over time. Polyps that are larger than a pea may be harmful. To be safe, caregivers remove and test all polyps. CAUSES  Polyps form when mutations in the genes cause your cells to grow and divide even though no more tissue is needed. RISK FACTORS There are a number of risk factors that can increase your chances of getting colon polyps. They include:  Being older than 50 years.  Family history of colon polyps or colon cancer.  Long-term colon diseases, such as colitis or Crohn disease.  Being overweight.  Smoking.  Being inactive.  Drinking too much alcohol. SYMPTOMS  Most small polyps do not cause symptoms. If symptoms are present, they may include:  Blood in the stool. The stool may look dark red or black.  Constipation or diarrhea that lasts longer than 1 week. DIAGNOSIS People often do not know they have polyps until their caregiver finds them during a regular checkup. Your caregiver can use 4 tests to check for polyps:  Digital rectal exam. The caregiver wears gloves and feels inside the rectum. This test would find polyps only in the rectum.  Barium enema. The caregiver puts a liquid called barium into your rectum before taking X-rays of your colon. Barium makes your colon look white. Polyps  are dark, so they are easy to see in the X-Mervil pictures.  Sigmoidoscopy. A thin, flexible tube (sigmoidoscope) is placed into your rectum. The sigmoidoscope has a light and tiny camera in it. The caregiver uses the sigmoidoscope to look at the last third of your colon.  Colonoscopy. This test is like sigmoidoscopy, but the caregiver looks at the entire colon. This is the most common method for finding and removing polyps. TREATMENT  Any polyps will be removed during a sigmoidoscopy or colonoscopy. The polyps are then tested for cancer. PREVENTION  To help lower your risk of getting more colon polyps:  Eat plenty of fruits and vegetables. Avoid eating fatty foods.  Do not smoke.  Avoid drinking alcohol.  Exercise every day.  Lose weight if recommended by your caregiver.  Eat plenty of calcium and folate. Foods that are rich in calcium include milk, cheese, and broccoli. Foods that are rich in folate include chickpeas, kidney beans, and spinach. HOME CARE INSTRUCTIONS Keep all follow-up appointments as directed by your caregiver. You may need periodic exams to check for polyps. SEEK  MEDICAL CARE IF: You notice bleeding during a bowel movement. Document Released: 01/29/2004 Document Revised: 07/27/2011 Document Reviewed: 07/14/2011 [facility] Patient Information 2015 [place], [place]. This information is not intended to replace advice given to you by your health care provider. Make sure you discuss any questions you have with your health care provider.   Diverticulosis Diverticulosis is the condition that develops when small pouches (diverticula) form in the wall of your colon. Your colon, or large intestine, is where water is absorbed and stool is formed. The pouches form when the inside layer of your colon pushes through weak spots in the outer layers of your colon. CAUSES  No one knows exactly what causes diverticulosis. RISK FACTORS  Being older than 62. Your risk for this condition  increases with age. Diverticulosis is rare in people younger than 40 years. By age 66, almost everyone has it.  Eating a low-fiber diet.  Being frequently constipated.  Being overweight.  Not getting enough exercise.  Smoking.  Taking over-the-counter pain medicines, like aspirin and ibuprofen. SYMPTOMS  Most people with diverticulosis do not have symptoms. DIAGNOSIS  Because diverticulosis often has no symptoms, health care providers often discover the condition during an exam for other colon problems. In many cases, a health care provider will diagnose diverticulosis while using a flexible scope to examine the colon (colonoscopy). TREATMENT  If you have never developed an infection related to diverticulosis, you may not need treatment. If you have had an infection before, treatment may include:  Eating more fruits, vegetables, and grains.  Taking a fiber supplement.  Taking a live bacteria supplement (probiotic).  Taking medicine to relax your colon. HOME CARE INSTRUCTIONS   Drink at least 6-8 glasses of water each day to prevent constipation.  Try not to strain when you have a bowel movement.  Keep all follow-up appointments. If you have had an infection before:  Increase the fiber in your diet as directed by your health care provider or dietitian.  Take a dietary fiber supplement if your health care provider approves.  Only take medicines as directed by your health care provider. SEEK MEDICAL CARE IF:   You have abdominal pain.  You have bloating.  You have cramps.  You have not gone to the bathroom in 3 days. SEEK IMMEDIATE MEDICAL CARE IF:   Your pain gets worse.  Yourbloating becomes very bad.  You have a fever or chills, and your symptoms suddenly get worse.  You begin vomiting.  You have bowel movements that are bloody or black. MAKE SURE YOU:  Understand these instructions.  Will watch your condition.  Will get help right away if you are  not doing well or get worse. Document Released: 01/30/2004 Document Revised: 05/09/2013 Document Reviewed: 03/29/2013 [facility] Patient Information 2015 [place], [place]. This information is not intended to replace advice given to you by your health care provider. Make sure you discuss any questions you have with your health care provider.

## 2014-10-01 NOTE — Interval H&P Note (Signed)
History and Physical Interval Note:  10/01/2014 8:31 AM  Palmer A Gasparini  has presented today for surgery, with the diagnosis of satiety, abd pain, abd bloating, GERD  The various methods of treatment have been discussed with the patient and family. After consideration of risks, benefits and other options for treatment, the patient has consented to  Procedure(s) with comments: COLONOSCOPY (N/A) - 815 ESOPHAGOGASTRODUODENOSCOPY (EGD) (N/A) ESOPHAGEAL DILATION (N/A) as a surgical intervention .  The patient's history has been reviewed, patient examined, no change in status, stable for surgery.  I have reviewed the patient's chart and labs.  Questions were answered to the patient's satisfaction.     [name] Rourk  No change. No dysphagia. Hemoglobin A1c is up and running high frequency 14 now down to 8. EGD and colonoscopy per plan.The risks, benefits, limitations, imponderables and alternatives regarding both EGD and colonoscopy have been reviewed with the patient. Questions have been answered. All parties agreeable.

## 2014-10-01 NOTE — Op Note (Signed)
[facility] [address] [place], [address]   COLONOSCOPY PROCEDURE REPORT  PATIENT: [name] [name], [name] [name]  MR#: [id number] BIRTHDATE: [birth year] , 63  yrs. old GENDER: male ENDOSCOPIST: R.  Garfield Cornea, MD [facility] REFERRED BY:  Dr. Redge Gainer PROCEDURE DATE:  10/13/14 PROCEDURE:   Colonoscopy with ablation and snare polypectomy INDICATIONS:First ever average risk colorectal cancer screening examination. MEDICATIONS: Versed 7 mg IV and Demerol 75 mg IV in divided doses. Phenergan 25 mg IV Zofran 4 mg IV ASA CLASS:       Class III  CONSENT: The risks, benefits, alternatives and imponderables including but not limited to bleeding, perforation as well as the possibility of a missed lesion have been reviewed.  The potential for biopsy, lesion removal, etc. have also been discussed. Questions have been answered.  All parties agreeable.  Please see the history and physical in the medical record for more information.  DESCRIPTION OF PROCEDURE:   After the risks benefits and alternatives of the procedure were thoroughly explained, informed consent was obtained.  The digital rectal exam revealed no abnormalities of the rectum.   The EG-[id number] ([id number])  endoscope was introduced through the anus and advanced to the   . No adverse events experienced.   The quality of the prep was adequate  The instrument was then slowly withdrawn as the colon was fully examined.      COLON FINDINGS: (1) 4 mm polyp in the rectum at 7 cm from anal verge with 2 adjacent diminutive polyps; otherwise, the remainder of the rectal mucosa appeared normal.  Elongated and redundant colon. Patient required changing of his position and external pressure to reach the cecum.  Patient had scattered left-sided diverticula; multiple polyps in the cecum, hepatic flexure, splenic flexure and descending segments ranging from 5-7 mm in dimensions.  No other abnormalities were observed.  Multiple hot  and cold snare polypectomies were performed.  2 diminutive polyps in the rectum were ablated with the tip of the hot snare loop.     .  Withdrawal time=  .  The scope was withdrawn and the procedure completed. COMPLICATIONS: There were no immediate complications.  ENDOSCOPIC IMPRESSION: Multiple colonic polyps removed/treated as described above. Redundant colon. Colonic diverticulosis  RECOMMENDATIONS: Follow-up on pathology. See EGD report.  eSigned:  R. Garfield Cornea, MD Rosalita Chessman [facility] 10/13/2014 9:58 AM   cc:  CPT CODES: ICD CODES:  The ICD and CPT codes recommended by this software are interpretations from the data that the clinical staff has captured with the software.  The verification of the translation of this report to the ICD and CPT codes and modifiers is the sole responsibility of the health care institution and practicing physician where this report was generated.  [employer] will not be held responsible for the validity of the ICD and CPT codes included on this report.  AMA assumes no liability for data contained or not contained herein. CPT is a Designer, television/film set of the [employer].  PATIENT NAME:  [name], [name] MR#: [id number]

## 2014-10-01 NOTE — Op Note (Signed)
[facility] [address] [place], [address]   ENDOSCOPY PROCEDURE REPORT  PATIENT: [name] [name] [name]  MR#: [id number] BIRTHDATE: Aug 20, 1950 , 5  yrs. old GENDER: male ENDOSCOPIST: R.  Garfield Cornea, MD FACP FACG REFERRED BY:  Redge Gainer, M.D. PROCEDURE DATE:  10-19-2014 PROCEDURE:  EGD w/ biopsy INDICATIONS:  early satiety and dyspepsia; denies dysphagia. MEDICATIONS: Versed 3 mg IV and Demerol 50 mg IV.  Phenergan 25 mg IV.  Zofran 4 mg IV.  Xylocaine gel orally. ASA CLASS:      Class III  CONSENT: The risks, benefits, limitations, alternatives and imponderables have been discussed.  The potential for biopsy, esophogeal dilation, etc. have also been reviewed.  Questions have been answered.  All parties agreeable.  Please see the history and physical in the medical record for more information.  DESCRIPTION OF PROCEDURE: After the risks benefits and alternatives of the procedure were thoroughly explained, informed consent was obtained.  The EG-[id number] ([id number]) endoscope was introduced through the mouth and advanced to the second portion of the duodenum , limited by Without limitations. The instrument was slowly withdrawn as the mucosa was fully examined.    Normal-appearing, patent tubular esophagus.  Stomach empty.  Patchy mottling / erythema and minimal polypoid appearance of the gastric mucosa.  No ulcer or infiltrating process.  Patent pylorus. Normal-appearing first and second portion of the duodenum.  Biopsies of abnormal appearing gastric mucosa taken for histologic study.  Retroflexed views revealed no abnormalities and Retroflexed views revealed as previously described.     The scope was then withdrawn from the patient and the procedure completed.  COMPLICATIONS: There were no immediate complications.  ENDOSCOPIC IMPRESSION: Abnormal stomach of uncertain significance?"status post gastric biopsy. Patient may well delayed gastric emptying with his  history of double digit hemoglobin A1c's.  RECOMMENDATIONS: may benefit from a solid phase gastric imaging study in the near future. However, for now, follow up on pathology. See colonoscopy report.  REPEAT EXAM:  eSigned:  R. Garfield Cornea, MD Rosalita Chessman [facility] 10-19-2014 8:53 AM    CC:  CPT CODES: ICD CODES:  The ICD and CPT codes recommended by this software are interpretations from the data that the clinical staff has captured with the software.  The verification of the translation of this report to the ICD and CPT codes and modifiers is the sole responsibility of the health care institution and practicing physician where this report was generated.  [employer] will not be held responsible for the validity of the ICD and CPT codes included on this report.  AMA assumes no liability for data contained or not contained herein. CPT is a Designer, television/film set of the [employer].  PATIENT NAME:  [name], [name] [name] MR#: [id number]

## 2014-10-03 ENCOUNTER — Encounter: Payer: Self-pay | Admitting: Internal Medicine

## 2014-10-03 ENCOUNTER — Encounter (HOSPITAL_COMMUNITY): Payer: Self-pay | Admitting: Internal Medicine

## 2014-10-04 ENCOUNTER — Ambulatory Visit (INDEPENDENT_AMBULATORY_CARE_PROVIDER_SITE_OTHER): Payer: Medicare Other | Admitting: Pharmacist

## 2014-10-04 ENCOUNTER — Encounter: Payer: Self-pay | Admitting: Pharmacist

## 2014-10-04 VITALS — BP 160/82 | HR 68 | Ht 74.0 in | Wt 274.0 lb

## 2014-10-04 DIAGNOSIS — E1165 Type 2 diabetes mellitus with hyperglycemia: Secondary | ICD-10-CM

## 2014-10-04 DIAGNOSIS — I10 Essential (primary) hypertension: Secondary | ICD-10-CM | POA: Diagnosis not present

## 2014-10-04 DIAGNOSIS — IMO0002 Reserved for concepts with insufficient information to code with codable children: Secondary | ICD-10-CM

## 2014-10-04 DIAGNOSIS — Z794 Long term (current) use of insulin: Secondary | ICD-10-CM

## 2014-10-04 MED ORDER — TELMISARTAN 40 MG PO TABS: 40.0000 mg | ORAL_TABLET | Freq: Every day | ORAL | Status: DC

## 2014-10-04 NOTE — Progress Notes (Signed)
  Subjective:    [name] [name] is a 64 y.o. male who presents for follow-up of Type 2 diabetes mellitus.  Current symptoms/problems include hyperglycemia, hypoglycemia  and abdominal pain (is currently being worked up for this by GI.  had upper endoscopy and colonoscopy 3 days ago) and have been unchanged.   Known diabetic complications: peripheral neuropathy and cardiovascular disease Cardiovascular risk factors: advanced age (older than 95 for men, 87 for women), diabetes mellitus, dyslipidemia, hypertension, male gender, obesity (BMI >= 30 kg/m2), sedentary lifestyle and smoking/ tobacco exposure Current diabetic medications include  Relion 70/30 mix insulin (this was chosen at patient request for cheapest insulin that would not put him in coverage gap - he pays out of pocket $25 per vial) and januvia 100mg  1 tablet daily  Eye exam current (within one year): no Weight trend: increasing steadily Prior visit with dietician: no Current diet: in general, an "unhealthy" diet - eats [employer] cookies, pork rinds, biscuits,  Current exercise: none  Current monitoring regimen: home blood tests - 1 times daily but has not bee compliant recently. Home blood sugar records: patient did not bring record Any episodes of hypoglycemia? yes - occurs when he skips or has a late lunch  Is He on ACE inhibitor or angiotensin II receptor blocker?  Yes  lisinopril (Zestril) patient c/o of dry cough which he believes is related to lisinopril  The following portions of the patient's history were reviewed and updated as appropriate: allergies, current medications, past family history, past medical history, past social history, past surgical history and problem list.  Objective:    BP 160/82 mmHg  Pulse 68  Ht 6\' 2"  (1.88 m)  Wt 274 lb (124.286 kg)  BMI 35.16 kg/m2   Lab Review GLUCOSE (mg/dL)  Date Value  08/13/2014 190*  04/06/2014 290*  12/20/2013 265*   GLUCOSE, BLD (mg/dL)  Date Value   07/30/2014 228*  08/09/2013 279*  08/08/2013 142*   CO2 (mmol/L)  Date Value  08/13/2014 25  07/30/2014 26  04/06/2014 23   BUN (mg/dL)  Date Value  08/13/2014 17  07/30/2014 18  04/06/2014 20  12/20/2013 32*  08/09/2013 20  08/08/2013 26*   CREATININE, SER (mg/dL)  Date Value  08/13/2014 0.77  07/30/2014 0.86  04/06/2014 1.19    Last A1c was 9.2% Assessment:    Diabetes Mellitus type II, under inadequate control.   HTN - not at goal Obetsity with continued poor diet Possible ACE cough  Plan:    1.  Rx changes:   No changes in insulin currently- will check with his insurance about coverage of long and short acting insulins.  Unable to increase 70/30 dose due to hypoglycemia around lunch.   D/C lisinopril due to possibly ACE cough  Start ARB - telmisartan 20mg  1 tablet daily 2.  Education: Reviewed 'ABCs' of diabetes management (respective goals in parentheses):  A1C (<7), blood pressure (<130/80), and cholesterol (LDL <100). 3.  Reviewed CHO counting diet in depth.  Patient encouraged to decrease portion sizes to help with BG and weight.  4.  Reminded to get eye exam.  5.  Consistent meals and meal time stressed due to using 70/30 mix insulin.  6.  Follow up: 1 month    [place], PharmD, CPP, CDE   Addendum:  Confirmed that lantus, Humalog, Levemir and Toujeo would cost $35 per month and $90 for 3 months through mail order.  Will discuss these options with patient.

## 2014-10-04 NOTE — Patient Instructions (Signed)
Increase non-starchy vegetables - carrots, green bean, squash, zucchini, tomatoes, onions, peppers, spinach and other green leafy vegetables, cabbage, lettuce, cucumbers, asparagus, okra (not fried), eggplant limit sugar and processed foods (cakes, cookies, ice cream, crackers and chips) Increase fresh fruit but limit serving sizes 1/2 cup or about the size of tennis or baseball limit red meat to no more than 1-2 times per week (serving size about the size of your palm) Choose whole grains / lean proteins - whole wheat bread, quinoa, whole grain rice (1/2 cup), fish, chicken, [place]   DASH Eating Plan DASH stands for "Dietary Approaches to Stop Hypertension." The DASH eating plan is a healthy eating plan that has been shown to reduce high blood pressure (hypertension). Additional health benefits may include reducing the risk of type 2 diabetes mellitus, heart disease, and stroke. The DASH eating plan may also help with weight loss. WHAT DO I NEED TO KNOW ABOUT THE DASH EATING PLAN? For the DASH eating plan, you will follow these general guidelines:  Choose foods with a percent daily value for sodium of less than 5% (as listed on the food label).  Use salt-free seasonings or herbs instead of table salt or sea salt.  Check with your health care provider or pharmacist before using salt substitutes.  Eat lower-sodium products, often labeled as "lower sodium" or "no salt added."  Eat fresh foods.  Eat more vegetables, fruits, and low-fat dairy products.  Choose whole grains. Look for the word "whole" as the first word in the ingredient list.  Choose fish and skinless chicken or [place] more often than red meat. Limit fish, poultry, and meat to 6 oz (170 g) each day.  Limit sweets, desserts, sugars, and sugary drinks.  Choose heart-healthy fats.  Limit cheese to 1 oz (28 g) per day.  Eat more home-cooked food and less restaurant, buffet, and fast food.  Limit fried foods.  Cook foods  using methods other than frying.  Limit canned vegetables. If you do use them, rinse them well to decrease the sodium.  When eating at a restaurant, ask that your food be prepared with less salt, or no salt if possible. WHAT FOODS CAN I EAT? Seek help from a dietitian for individual calorie needs. Grains Whole grain or whole wheat bread. Brown rice. Whole grain or whole wheat pasta. Quinoa, bulgur, and whole grain cereals. Low-sodium cereals. Corn or whole wheat flour tortillas. Whole grain cornbread. Whole grain crackers. Low-sodium crackers. Vegetables Fresh or frozen vegetables (raw, steamed, roasted, or grilled). Low-sodium or reduced-sodium tomato and vegetable juices. Low-sodium or reduced-sodium tomato sauce and paste. Low-sodium or reduced-sodium canned vegetables.  Fruits All fresh, canned (in natural juice), or frozen fruits. Meat and Other Protein Products Ground beef (85% or leaner), grass-fed beef, or beef trimmed of fat. Skinless chicken or [place]. Ground chicken or [place]. Pork trimmed of fat. All fish and seafood. Eggs. Dried beans, peas, or lentils. Unsalted nuts and seeds. Unsalted canned beans. Dairy Low-fat dairy products, such as skim or 1% milk, 2% or reduced-fat cheeses, low-fat ricotta or cottage cheese, or plain low-fat yogurt. Low-sodium or reduced-sodium cheeses. Fats and Oils Tub margarines without trans fats. Light or reduced-fat mayonnaise and salad dressings (reduced sodium). Avocado. Safflower, olive, or canola oils. Natural peanut or almond butter. Other Unsalted popcorn and pretzels. The items listed above may not be a complete list of recommended foods or beverages. Contact your dietitian for more options. WHAT FOODS ARE NOT RECOMMENDED? Grains White bread. White pasta. White  rice. Refined cornbread. Bagels and croissants. Crackers that contain trans fat. Vegetables Creamed or fried vegetables. Vegetables in a cheese sauce. Regular canned vegetables.  Regular canned tomato sauce and paste. Regular tomato and vegetable juices. Fruits Dried fruits. Canned fruit in light or heavy syrup. Fruit juice. Meat and Other Protein Products Fatty cuts of meat. Ribs, chicken wings, bacon, sausage, bologna, salami, chitterlings, fatback, hot dogs, bratwurst, and packaged luncheon meats. Salted nuts and seeds. Canned beans with salt. Dairy Whole or 2% milk, cream, half-and-half, and cream cheese. Whole-fat or sweetened yogurt. Full-fat cheeses or blue cheese. Nondairy creamers and whipped toppings. Processed cheese, cheese spreads, or cheese curds. Condiments Onion and garlic salt, seasoned salt, table salt, and sea salt. Canned and packaged gravies. Worcestershire sauce. Tartar sauce. Barbecue sauce. Teriyaki sauce. Soy sauce, including reduced sodium. Steak sauce. Fish sauce. Oyster sauce. Cocktail sauce. Horseradish. Ketchup and mustard. Meat flavorings and tenderizers. Bouillon cubes. Hot sauce. Tabasco sauce. Marinades. Taco seasonings. Relishes. Fats and Oils Butter, stick margarine, lard, shortening, ghee, and bacon fat. Coconut, palm kernel, or palm oils. Regular salad dressings. Other Pickles and olives. Salted popcorn and pretzels. The items listed above may not be a complete list of foods and beverages to avoid. Contact your dietitian for more information. WHERE CAN I FIND MORE INFORMATION? National Heart, Lung, and Blood Institute: travelstabloid.com Document Released: 04/23/2011 Document Revised: 09/18/2013 Document Reviewed: 03/08/2013 [facility] Patient Information 2015 [place], [place]. This information is not intended to replace advice given to you by your health care provider. Make sure you discuss any questions you have with your health care provider.

## 2014-10-05 NOTE — Progress Notes (Signed)
Error - duplicate appt please see note for appt at 10am.  Patient misses 8am appt. Missed appt was not taken out of record and patient was arrived for 2 appts.

## 2014-10-08 DIAGNOSIS — M47816 Spondylosis without myelopathy or radiculopathy, lumbar region: Secondary | ICD-10-CM | POA: Diagnosis not present

## 2014-10-08 DIAGNOSIS — M9903 Segmental and somatic dysfunction of lumbar region: Secondary | ICD-10-CM | POA: Diagnosis not present

## 2014-10-08 DIAGNOSIS — M47812 Spondylosis without myelopathy or radiculopathy, cervical region: Secondary | ICD-10-CM | POA: Diagnosis not present

## 2014-10-08 DIAGNOSIS — M9901 Segmental and somatic dysfunction of cervical region: Secondary | ICD-10-CM | POA: Diagnosis not present

## 2014-10-08 DIAGNOSIS — M5442 Lumbago with sciatica, left side: Secondary | ICD-10-CM | POA: Diagnosis not present

## 2014-10-17 DIAGNOSIS — M5442 Lumbago with sciatica, left side: Secondary | ICD-10-CM | POA: Diagnosis not present

## 2014-10-17 DIAGNOSIS — M9901 Segmental and somatic dysfunction of cervical region: Secondary | ICD-10-CM | POA: Diagnosis not present

## 2014-10-17 DIAGNOSIS — M47816 Spondylosis without myelopathy or radiculopathy, lumbar region: Secondary | ICD-10-CM | POA: Diagnosis not present

## 2014-10-17 DIAGNOSIS — M47812 Spondylosis without myelopathy or radiculopathy, cervical region: Secondary | ICD-10-CM | POA: Diagnosis not present

## 2014-10-22 DIAGNOSIS — M5442 Lumbago with sciatica, left side: Secondary | ICD-10-CM | POA: Diagnosis not present

## 2014-10-22 DIAGNOSIS — M47816 Spondylosis without myelopathy or radiculopathy, lumbar region: Secondary | ICD-10-CM | POA: Diagnosis not present

## 2014-10-22 DIAGNOSIS — M47812 Spondylosis without myelopathy or radiculopathy, cervical region: Secondary | ICD-10-CM | POA: Diagnosis not present

## 2014-10-22 DIAGNOSIS — M9901 Segmental and somatic dysfunction of cervical region: Secondary | ICD-10-CM | POA: Diagnosis not present

## 2014-10-22 DIAGNOSIS — M9903 Segmental and somatic dysfunction of lumbar region: Secondary | ICD-10-CM | POA: Diagnosis not present

## 2014-11-08 ENCOUNTER — Ambulatory Visit (INDEPENDENT_AMBULATORY_CARE_PROVIDER_SITE_OTHER): Payer: Medicare Other | Admitting: Pharmacist

## 2014-11-08 ENCOUNTER — Encounter: Payer: Self-pay | Admitting: Pharmacist

## 2014-11-08 VITALS — BP 158/82 | HR 68 | Ht 74.0 in | Wt 279.0 lb

## 2014-11-08 DIAGNOSIS — I10 Essential (primary) hypertension: Secondary | ICD-10-CM | POA: Diagnosis not present

## 2014-11-08 DIAGNOSIS — IMO0002 Reserved for concepts with insufficient information to code with codable children: Secondary | ICD-10-CM

## 2014-11-08 DIAGNOSIS — E1169 Type 2 diabetes mellitus with other specified complication: Secondary | ICD-10-CM | POA: Diagnosis not present

## 2014-11-08 DIAGNOSIS — E1142 Type 2 diabetes mellitus with diabetic polyneuropathy: Secondary | ICD-10-CM | POA: Diagnosis not present

## 2014-11-08 DIAGNOSIS — E785 Hyperlipidemia, unspecified: Secondary | ICD-10-CM | POA: Diagnosis not present

## 2014-11-08 DIAGNOSIS — Z794 Long term (current) use of insulin: Principal | ICD-10-CM

## 2014-11-08 DIAGNOSIS — G629 Polyneuropathy, unspecified: Secondary | ICD-10-CM | POA: Diagnosis not present

## 2014-11-08 DIAGNOSIS — E1165 Type 2 diabetes mellitus with hyperglycemia: Secondary | ICD-10-CM | POA: Diagnosis not present

## 2014-11-08 LAB — POCT GLYCOSYLATED HEMOGLOBIN (HGB A1C): Hemoglobin A1C: 8.1

## 2014-11-08 NOTE — Patient Instructions (Signed)
Trial of Tresiba 200 - inject 115 units once a day Can use Novolog as needed for blood glucose over 250.   Diabetes and Standards of Medical Care   Diabetes is complicated. You may find that your diabetes team includes a dietitian, nurse, diabetes educator, eye doctor, and more. To help everyone know what is going on and to help you get the care you deserve, the following schedule of care was developed to help keep you on track. Below are the tests, exams, vaccines, medicines, education, and plans you will need.  Blood Glucose Goals Prior to meals = 80 - 130 Within 2 hours of the start of a meal = less than 180  HbA1c test (goal is less than 7.0% - your last value was %) This test shows how well you have controlled your glucose over the past 2 to 3 months. It is used to see if your diabetes management plan needs to be adjusted.   It is performed at least 2 times a year if you are meeting treatment goals.  It is performed 4 times a year if therapy has changed or if you are not meeting treatment goals.  Blood pressure test  This test is performed at every routine medical visit. The goal is less than 140/90 mmHg for most people, but 130/80 mmHg in some cases. Ask your health care provider about your goal.  Dental exam  Follow up with the dentist regularly.  Eye exam  If you are diagnosed with type 1 diabetes as a child, get an exam upon reaching the age of 63 years or older and have had diabetes for 3 to 5 years. Yearly eye exams are recommended after that initial eye exam.  If you are diagnosed with type 1 diabetes as an adult, get an exam within 5 years of diagnosis and then yearly.  If you are diagnosed with type 2 diabetes, get an exam as soon as possible after the diagnosis and then yearly.  Foot care exam  Visual foot exams are performed at every routine medical visit. The exams check for cuts, injuries, or other problems with the feet.  A comprehensive foot exam should be  done yearly. This includes visual inspection as well as assessing foot pulses and testing for loss of sensation.  Check your feet nightly for cuts, injuries, or other problems with your feet. Tell your health care provider if anything is not healing.  Kidney function test (urine microalbumin)  This test is performed once a year.  Type 1 diabetes: The first test is performed 5 years after diagnosis.  Type 2 diabetes: The first test is performed at the time of diagnosis.  A serum creatinine and estimated glomerular filtration rate (eGFR) test is done once a year to assess the level of chronic kidney disease (CKD), if present.  Lipid profile (cholesterol, HDL, LDL, triglycerides)  Performed every 5 years for most people.  The goal for LDL is less than 100 mg/dL. If you are at high risk, the goal is less than 70 mg/dL.  The goal for HDL is 40 mg/dL to 50 mg/dL for men and 50 mg/dL to 60 mg/dL for women. An HDL cholesterol of 60 mg/dL or higher gives some protection against heart disease.  The goal for triglycerides is less than 150 mg/dL.  Influenza vaccine, pneumococcal vaccine, and hepatitis B vaccine  The influenza vaccine is recommended yearly.  The pneumococcal vaccine is generally given once in a lifetime. However, there are some instances when  another vaccination is recommended. Check with your health care provider.  The hepatitis B vaccine is also recommended for adults with diabetes.  Diabetes self-management education  Education is recommended at diagnosis and ongoing as needed.  Treatment plan  Your treatment plan is reviewed at every medical visit.  Document Released: 03/01/2009 Document Revised: 01/04/2013 Document Reviewed: 10/04/2012 [facility] Patient Information 2014 [place].

## 2014-11-08 NOTE — Progress Notes (Signed)
Subjective:    [name] [name] is a 64 y.o. male who presents for follow-up of Type 2 diabetes mellitus.  Current symptoms/problems include hyperglycemia and hypoglycemia  and have been unchanged.   Known diabetic complications: peripheral neuropathy and cardiovascular disease Cardiovascular risk factors: advanced age (older than 26 for men, 41 for women), diabetes mellitus, dyslipidemia, hypertension, male gender, obesity (BMI >= 30 kg/m2), sedentary lifestyle and smoking/ tobacco exposure Current diabetic medications include  Relion 70/30 mix insulin (this was chosen at patient request for cheapest insulin that would not put him in coverage gap - he pays out of pocket $25 per vial and usually gets about 5 vials per month) and januvia $RemoveBef'100mg'jOgjzbDVqT$  1 tablet daily.   I checked into insurance coverage of Levemir insulin pen and it would be $35/month supply or $90/90 days through mail order - until of course he reaches medicare coverage gap.    Eye exam current (within one year): no Weight trend: increasing steadily Prior visit with dietician: no Current diet: in general, an "unhealthy" diet - eats Little Debbie cookies, potato chips, apply cookies Current exercise: none  Current monitoring regimen: home blood tests - 1 times daily but has not bee compliant recently. Home blood sugar records: patient did not bring record Any episodes of hypoglycemia?possible - patient has not confirmed with HBG reading but he states that he feels that BG is getting low after each insulin dose.  He feels tired and lethargic.   Is He on ACE inhibitor or angiotensin II receptor blocker?  Yes  telmisartan $RemoveBefo'40mg'xpEDDeJQzYx$  qd - started in place of lisinopril due to cough.  Cough has resolved with this change.  Home BP readings per patient and his wife have been Systolic BP = 765 to 465 Diastolic BP = 50 to 65  The following portions of the patient's history were reviewed and updated as appropriate: allergies, current medications,  past family history, past medical history, past social history, past surgical history and problem list.  Objective:    BP 158/82 mmHg  Pulse 68  Ht $R'6\' 2"'vs$  (1.88 m)  Wt 279 lb (126.554 kg)  BMI 35.81 kg/m2  Diabetic Foot Form - Detailed   Diabetic Foot Exam - detailed  Visual Foot Exam completed.:  Yes  Is there a history of foot ulcer?:  No  Can the patient see the bottom of their feet?:  Yes  Are the shoes appropriate in style and fit?:  Yes  Is there swelling or and abnormal foot shape?:  Yes  Are the toenails long?:  No  Are the toenails thick?:  No  Do you have pain in calf while walking?:  Yes  Is there a claw toe deformity?:  No  Is there elevated skin temparature?:  No  Is there limited skin dorsiflexion?:  No  Is there foot or ankle muscle weakness?:  Yes (Comment: history of broken left foot)  Are the toenails ingrown?:  No  Normal Range of Motion:  Yes    Pulse Foot Exam completed.:  Yes  Right posterior Tibialias:  Present Left posterior Tibialias:  Present  Right Dorsalis Pedis:  Present Left Dorsalis Pedis:  Present  Sensory Foot Exam Completed.:  Yes  Semmes-Weinstein Monofilament Test  R Foot Test Control:  Pos L Foot Test Control:  Pos  R Site 1-Great Toe:  Pos L Site 1-Great Toe:  Pos  R Site 4:  Pos L Site 4:  Pos  R Site 5:  Pos L Site 5:  Pos    Comments:  Patient takes gabapentin $RemoveBeforeDE'300mg'mPeYIvCSIYeuAoS$  bid to tid for neuropathy.  States that has numbness and tingling in both feet during the evening.        Lab Review GLUCOSE (mg/dL)  Date Value  08/13/2014 190*  04/06/2014 290*  12/20/2013 265*   GLUCOSE, BLD (mg/dL)  Date Value  07/30/2014 228*  08/09/2013 279*  08/08/2013 142*   CO2 (mmol/L)  Date Value  08/13/2014 25  07/30/2014 26  04/06/2014 23   BUN (mg/dL)  Date Value  08/13/2014 17  07/30/2014 18  04/06/2014 20  12/20/2013 32*  08/09/2013 20  08/08/2013 26*   CREATININE, SER (mg/dL)  Date Value  08/13/2014 0.77  07/30/2014 0.86   04/06/2014 1.19    Last A1c was 9.2% (03/14/2016_ A1c today was 8.1%  Assessment:    Diabetes Mellitus type II, under inadequate control.   HTN - not at goal but home BP readings are almost too low at times. Obetsity with continued poor diet Hyperlipidemia - has refused statins due to h/o myalgias diabetic neuropathy - currently controlled but patient in need of diabetic shoes  Plan:    1.  Rx changes:   Patient given samples of Tresiba U-200 insulin to try - instructed to inject 115 units once a day  He is also given Novolog insulin to inject 10 units as needed for BG over 250.  He is to call office if he has to use Novolog more than once daily. 2.  Education: Reviewed 'ABCs' of diabetes management (respective goals in parentheses):  A1C (<7), blood pressure (<130/80), and cholesterol (LDL <100).  Bring in home blood pressure monitor to next visit to confirm accuracy.  3.  Reviewed CHO counting diet in depth.  Again reviewed patient's dietary choices.  Expressed that what concerns me the most is his poor choices and quantities.  Reviewed if he is to have sweets he needed to limit amount (ie 2 cookies instead of 6, 15 chip instead of half the bag).  Discussed portioning out food and then putting food away as a tactic to assist in decreasing portion sizes.  Encouraged increase in lean proteins and non starchy vegetables.  4.  Reminded to get eye exam.  5.   Orders Placed This Encounter  Procedures  . Lipid panel  . CMP14+EGFR  . LDL cholesterol, direct  . Microalbumin / creatinine urine ratio  . POCT glycosylated hemoglobin (Hb A1C)  . PR DIABETIC DELUXE SHOE    Diabetic peripheral neuropathy DX: E11.42; G62.9    6.  Follow up: 1 month    Cherre Robins, PharmD, CPP, CDE

## 2014-11-09 LAB — CMP14+EGFR
ALT: 26 IU/L (ref 0–44)
AST: 21 IU/L (ref 0–40)
Albumin/Globulin Ratio: 1.6 (ref 1.1–2.5)
Albumin: 4.4 g/dL (ref 3.6–4.8)
Alkaline Phosphatase: 88 IU/L (ref 39–117)
BUN/Creatinine Ratio: 18 (ref 10–22)
BUN: 19 mg/dL (ref 8–27)
Bilirubin Total: 0.3 mg/dL (ref 0.0–1.2)
CO2: 24 mmol/L (ref 18–29)
Calcium: 10.1 mg/dL (ref 8.6–10.2)
Chloride: 93 mmol/L — ABNORMAL LOW (ref 97–108)
Creatinine, Ser: 1.04 mg/dL (ref 0.76–1.27)
GFR calc Af Amer: 87 mL/min/{1.73_m2} (ref >59)
GFR calc non Af Amer: 76 mL/min/{1.73_m2} (ref >59)
Globulin, Total: 2.7 g/dL (ref 1.5–4.5)
Glucose: 248 mg/dL — ABNORMAL HIGH (ref 65–99)
Potassium: 4.7 mmol/L (ref 3.5–5.2)
Sodium: 136 mmol/L (ref 134–144)
Total Protein: 7.1 g/dL (ref 6.0–8.5)

## 2014-11-09 LAB — LDL CHOLESTEROL, DIRECT: LDL Direct: 172 mg/dL — ABNORMAL HIGH (ref 0–99)

## 2014-11-09 LAB — LIPID PANEL
Chol/HDL Ratio: 10.1 ratio units — ABNORMAL HIGH (ref 0.0–5.0)
Cholesterol, Total: 253 mg/dL — ABNORMAL HIGH (ref 100–199)
HDL: 25 mg/dL — ABNORMAL LOW (ref >39)
Triglycerides: 458 mg/dL — ABNORMAL HIGH (ref 0–149)

## 2014-11-09 LAB — MICROALBUMIN / CREATININE URINE RATIO
Creatinine, Urine: 86 mg/dL
MICROALB/CREAT RATIO: 156.2 mg/g creat — ABNORMAL HIGH (ref 0.0–30.0)
Microalbumin, Urine: 134.3 ug/mL

## 2014-11-12 ENCOUNTER — Telehealth: Payer: Self-pay | Admitting: Family Medicine

## 2014-11-13 DIAGNOSIS — G459 Transient cerebral ischemic attack, unspecified: Secondary | ICD-10-CM | POA: Diagnosis not present

## 2014-11-13 DIAGNOSIS — M5021 Other cervical disc displacement,  high cervical region: Secondary | ICD-10-CM | POA: Diagnosis not present

## 2014-11-13 DIAGNOSIS — M47816 Spondylosis without myelopathy or radiculopathy, lumbar region: Secondary | ICD-10-CM | POA: Diagnosis not present

## 2014-11-13 DIAGNOSIS — G629 Polyneuropathy, unspecified: Secondary | ICD-10-CM | POA: Diagnosis not present

## 2014-11-14 ENCOUNTER — Telehealth: Payer: Self-pay | Admitting: Pharmacist

## 2014-11-14 MED ORDER — INSULIN ASPART 100 UNIT/ML FLEXPEN: 10.0000 [IU] | PEN_INJECTOR | Freq: Three times a day (TID) | SUBCUTANEOUS | Status: DC

## 2014-11-14 MED ORDER — INSULIN DETEMIR 100 UNIT/ML FLEXPEN: PEN_INJECTOR | SUBCUTANEOUS | Status: DC

## 2014-11-14 NOTE — Telephone Encounter (Signed)
Patient's insurance will cover levemir pens - cost $135/3 months.  Also discussed recent lab results.  Microalbumin in urine was elevated - need better BP control. Recommended increase micardis from 40 to 80mg  at office visit but patient refused. Suggest again to increase BP medication but patient refused siting that at times his BP is low (110's/60's).   Triglycerides and LDL are elevated. Recommend start statin (though patient has refused in past) and stop Red Yeast Rice.  Patient refuses to try another statin at this time.  BG elevated - discussed at appt yesterday.  All other labs OK.

## 2014-11-14 NOTE — Telephone Encounter (Signed)
Please see duplicate phone call encounter.

## 2014-11-25 ENCOUNTER — Other Ambulatory Visit: Payer: Self-pay | Admitting: Pharmacist

## 2014-12-05 DIAGNOSIS — M5136 Other intervertebral disc degeneration, lumbar region: Secondary | ICD-10-CM | POA: Diagnosis not present

## 2014-12-05 DIAGNOSIS — M47812 Spondylosis without myelopathy or radiculopathy, cervical region: Secondary | ICD-10-CM | POA: Diagnosis not present

## 2014-12-05 DIAGNOSIS — M9903 Segmental and somatic dysfunction of lumbar region: Secondary | ICD-10-CM | POA: Diagnosis not present

## 2014-12-05 DIAGNOSIS — M9901 Segmental and somatic dysfunction of cervical region: Secondary | ICD-10-CM | POA: Diagnosis not present

## 2014-12-05 DIAGNOSIS — M47816 Spondylosis without myelopathy or radiculopathy, lumbar region: Secondary | ICD-10-CM | POA: Diagnosis not present

## 2014-12-09 ENCOUNTER — Other Ambulatory Visit: Payer: Self-pay | Admitting: Family Medicine

## 2014-12-12 DIAGNOSIS — M47816 Spondylosis without myelopathy or radiculopathy, lumbar region: Secondary | ICD-10-CM | POA: Diagnosis not present

## 2014-12-12 DIAGNOSIS — M47812 Spondylosis without myelopathy or radiculopathy, cervical region: Secondary | ICD-10-CM | POA: Diagnosis not present

## 2014-12-12 DIAGNOSIS — M9903 Segmental and somatic dysfunction of lumbar region: Secondary | ICD-10-CM | POA: Diagnosis not present

## 2014-12-12 DIAGNOSIS — M9901 Segmental and somatic dysfunction of cervical region: Secondary | ICD-10-CM | POA: Diagnosis not present

## 2014-12-12 DIAGNOSIS — M5136 Other intervertebral disc degeneration, lumbar region: Secondary | ICD-10-CM | POA: Diagnosis not present

## 2014-12-13 ENCOUNTER — Other Ambulatory Visit: Payer: Self-pay | Admitting: Neurosurgery

## 2014-12-13 DIAGNOSIS — M47816 Spondylosis without myelopathy or radiculopathy, lumbar region: Secondary | ICD-10-CM

## 2014-12-18 ENCOUNTER — Telehealth: Payer: Self-pay | Admitting: Family Medicine

## 2014-12-18 MED ORDER — INSULIN ASPART PROT & ASPART (70-30 MIX) 100 UNIT/ML PEN: 75.0000 [IU] | PEN_INJECTOR | Freq: Two times a day (BID) | SUBCUTANEOUS | Status: DC

## 2014-12-18 MED ORDER — INSULIN ASPART 100 UNIT/ML FLEXPEN: PEN_INJECTOR | SUBCUTANEOUS | Status: DC

## 2014-12-18 NOTE — Telephone Encounter (Signed)
Patient states that BG has been up 180 - 348 since change from Mix 70/30 insulin to Levemir + novolog.  He would like to switch back. Discontinue Levemir.  Rx sent in for Novolog Mix 70/30 - inject 75 units bid.  Patient is also instructed to check BG at lunch - if BG is 250 to 300 then given 5 units of Novolog (plain) or if 310 or over to give 10 units.

## 2014-12-20 ENCOUNTER — Other Ambulatory Visit: Payer: Self-pay | Admitting: Pharmacist

## 2014-12-20 MED ORDER — INSULIN LISPRO PROT & LISPRO (75-25 MIX) 100 UNIT/ML KWIKPEN: 75.0000 [IU] | PEN_INJECTOR | Freq: Two times a day (BID) | SUBCUTANEOUS | Status: DC

## 2014-12-20 MED ORDER — INSULIN LISPRO 100 UNIT/ML (KWIKPEN)
PEN_INJECTOR | SUBCUTANEOUS | Status: DC
Start: 2014-12-20 — End: 2015-06-13

## 2014-12-24 ENCOUNTER — Telehealth: Payer: Self-pay | Admitting: Pharmacist

## 2014-12-24 MED ORDER — SITAGLIPTIN PHOSPHATE 100 MG PO TABS: 100.0000 mg | ORAL_TABLET | Freq: Every day | ORAL | Status: DC

## 2014-12-24 NOTE — Telephone Encounter (Signed)
#  28 Januvia 100mg  left at front desk for patient .  Patient notified.

## 2014-12-25 DIAGNOSIS — E119 Type 2 diabetes mellitus without complications: Secondary | ICD-10-CM | POA: Diagnosis not present

## 2014-12-25 DIAGNOSIS — H52223 Regular astigmatism, bilateral: Secondary | ICD-10-CM | POA: Diagnosis not present

## 2014-12-25 DIAGNOSIS — H524 Presbyopia: Secondary | ICD-10-CM | POA: Diagnosis not present

## 2014-12-25 DIAGNOSIS — H5203 Hypermetropia, bilateral: Secondary | ICD-10-CM | POA: Diagnosis not present

## 2014-12-25 LAB — HM DIABETES EYE EXAM

## 2014-12-27 ENCOUNTER — Ambulatory Visit
Admission: RE | Admit: 2014-12-27 | Discharge: 2014-12-27 | Disposition: A | Discharge status: Home / Self Care | Payer: Medicare Other | Source: Ambulatory Visit | Attending: Neurosurgery | Admitting: Neurosurgery

## 2014-12-27 DIAGNOSIS — M4806 Spinal stenosis, lumbar region: Secondary | ICD-10-CM | POA: Diagnosis not present

## 2014-12-27 DIAGNOSIS — M47816 Spondylosis without myelopathy or radiculopathy, lumbar region: Secondary | ICD-10-CM

## 2014-12-27 DIAGNOSIS — M5137 Other intervertebral disc degeneration, lumbosacral region: Secondary | ICD-10-CM | POA: Diagnosis not present

## 2014-12-27 DIAGNOSIS — M47817 Spondylosis without myelopathy or radiculopathy, lumbosacral region: Secondary | ICD-10-CM | POA: Diagnosis not present

## 2014-12-27 DIAGNOSIS — M5126 Other intervertebral disc displacement, lumbar region: Secondary | ICD-10-CM | POA: Diagnosis not present

## 2014-12-31 ENCOUNTER — Encounter: Payer: Self-pay | Admitting: *Deleted

## 2015-01-03 ENCOUNTER — Encounter: Payer: Self-pay | Admitting: Pharmacist

## 2015-01-03 ENCOUNTER — Ambulatory Visit (INDEPENDENT_AMBULATORY_CARE_PROVIDER_SITE_OTHER): Payer: Medicare Other | Admitting: Pharmacist

## 2015-01-03 VITALS — BP 140/80 | HR 63 | Ht 74.0 in | Wt 281.5 lb

## 2015-01-03 DIAGNOSIS — E669 Obesity, unspecified: Secondary | ICD-10-CM | POA: Diagnosis not present

## 2015-01-03 DIAGNOSIS — E1169 Type 2 diabetes mellitus with other specified complication: Secondary | ICD-10-CM

## 2015-01-03 DIAGNOSIS — E785 Hyperlipidemia, unspecified: Secondary | ICD-10-CM | POA: Diagnosis not present

## 2015-01-03 DIAGNOSIS — E1165 Type 2 diabetes mellitus with hyperglycemia: Secondary | ICD-10-CM

## 2015-01-03 DIAGNOSIS — Z794 Long term (current) use of insulin: Secondary | ICD-10-CM

## 2015-01-03 DIAGNOSIS — I10 Essential (primary) hypertension: Secondary | ICD-10-CM | POA: Diagnosis not present

## 2015-01-03 DIAGNOSIS — IMO0002 Reserved for concepts with insufficient information to code with codable children: Secondary | ICD-10-CM

## 2015-01-03 DIAGNOSIS — R5382 Chronic fatigue, unspecified: Secondary | ICD-10-CM | POA: Diagnosis not present

## 2015-01-03 MED ORDER — FUROSEMIDE 20 MG PO TABS: 20.0000 mg | ORAL_TABLET | Freq: Every day | ORAL | Status: DC | PRN

## 2015-01-03 MED ORDER — PEN NEEDLES 31G X 6 MM MISC: Status: DC

## 2015-01-03 NOTE — Progress Notes (Signed)
Subjective:    [name] [name] is a 64 y.o. male who presents for follow-up of Type 2 diabetes mellitus.  Current symptoms/problems include hyperglycemia, hypoglycemia  and polyuria and have been much improved per patient.    Known diabetic complications: peripheral neuropathy and cardiovascular disease Cardiovascular risk factors: advanced age (older than 71 for men, 57 for women), diabetes mellitus, dyslipidemia, hypertension, male gender, obesity (BMI >= 30 kg/m2), sedentary lifestyle and smoking/ tobacco exposure   Current diabetic medications include  Novolog 70/30 mix insulin and januvia 100mg  1 tablet daily. At our last visit tried to change to [place] and short acting insulin but patient called 1 week later and asked to change back to Novolog mix 70/30 because BG has not improved.   Patient has both insulin pens and vials + syringes.  He states that he does not like the pens as well because he get a large lump under his skin after administration and that it stings.  He is using 75mm - mini pen needles  Eye exam current (within one year): yes - appt with Dr Rona Ravens 12/25/2014,  Received new glasses Weight trend: increasing steadily Prior visit with dietician: no Current diet: in general, an "unhealthy" diet  Current exercise: none  Current monitoring regimen:  Recommended to check 1-2 times daily but patient has not been comliant recently.  He reports checking BG about once weekly and when he feels BG is either low or high.  Last BG reported by patient was 149 (12/28/2014)  Home blood sugar records: patient did not bring record Any episodes of hypoglycemia? possible - patient has not confirmed with HBG reading   Is He on ACE inhibitor or angiotensin II receptor blocker?  No telmisartan 40mg  qd - started in place of lisinopril due to cough.  Cough resolved with change but over the last month patient reports that BP at home dropped to 70/40 and he stopped telmisartan  Patient c/o retaining  fluid and edema.  Occurs daily.   The following portions of the patient's history were reviewed and updated as appropriate: allergies, current medications, past family history, past medical history, past social history, past surgical history and problem list.  Objective:    BP 140/80 mmHg  Pulse 63  Ht 6\' 2"  (1.88 m)  Wt 281 lb 8 oz (127.688 kg)  BMI 36.13 kg/m2  LEE - trace bilaterally  Lab Review GLUCOSE (mg/dL)  Date Value  11/08/2014 248*  08/13/2014 190*  04/06/2014 290*   GLUCOSE, BLD (mg/dL)  Date Value  07/30/2014 228*  08/09/2013 279*  08/08/2013 142*   CO2 (mmol/L)  Date Value  11/08/2014 24  08/13/2014 25  07/30/2014 26   BUN (mg/dL)  Date Value  11/08/2014 19  08/13/2014 17  07/30/2014 18  04/06/2014 20  08/09/2013 20  08/08/2013 26*   CREATININE, SER (mg/dL)  Date Value  11/08/2014 1.04  08/13/2014 0.77  07/30/2014 0.86     Last A1c today was 8.1% 11/08/2014 - previous A1c was 9.2% (07/30/2014)  Assessment:    Diabetes Mellitus type II, under inadequate but improved control.   HTN - not at goal but patient refuses to restart telmisartan or any other med for BP Obetsity with continued poor diet Hyperlipidemia - has refused statins due to h/o myalgias Edema  Plan:    1.  Rx changes:   Continue Novolog Mix 70/30 - injection 80 unit BID  Also continue Novolog insulin to inject 10 units as needed for BG over 250.  He is to call office if he has to use Novolog more than 3 times per week  Add furosemide 20mg  take 1 tablet as needed for swelling  Change insulin pen needles to 73mm 2.  Reviewed injection site selection and injection technique with patient and his wife. 3.  Education: Reviewed 'ABCs' of diabetes management (respective goals in parentheses):  A1C (<7), blood pressure (<130/80), and cholesterol (LDL <100).  Bring in home blood pressure monitor to next visit to confirm accuracy.  4.  Reviewed CHO counting diet.    Reviewed patient's  dietary choices.   5.   Orders Placed This Encounter  Procedures  . Thyroid Panel With TSH  . CBC with Differential/Platelet    6.  Follow up: 1 month   To see PCP and have labs recheck.  Follow up with me in 2 to 3 months  Cherre Robins, PharmD, CPP, CDE

## 2015-01-04 LAB — CBC WITH DIFFERENTIAL/PLATELET
Basophils Absolute: 0.1 10*3/uL (ref 0.0–0.2)
Basos: 1 %
EOS (ABSOLUTE): 0.2 10*3/uL (ref 0.0–0.4)
Eos: 2 %
Hematocrit: 45.1 % (ref 37.5–51.0)
Hemoglobin: 15.2 g/dL (ref 12.6–17.7)
Immature Grans (Abs): 0 10*3/uL (ref 0.0–0.1)
Immature Granulocytes: 0 %
Lymphocytes Absolute: 2.2 10*3/uL (ref 0.7–3.1)
Lymphs: 19 %
MCH: 29.5 pg (ref 26.6–33.0)
MCHC: 33.7 g/dL (ref 31.5–35.7)
MCV: 87 fL (ref 79–97)
Monocytes Absolute: 0.9 10*3/uL (ref 0.1–0.9)
Monocytes: 8 %
Neutrophils Absolute: 7.8 10*3/uL — ABNORMAL HIGH (ref 1.4–7.0)
Neutrophils: 70 %
Platelets: 258 10*3/uL (ref 150–379)
RBC: 5.16 x10E6/uL (ref 4.14–5.80)
RDW: 13.4 % (ref 12.3–15.4)
WBC: 11.2 10*3/uL — ABNORMAL HIGH (ref 3.4–10.8)

## 2015-01-04 LAB — THYROID PANEL WITH TSH
Free Thyroxine Index: 1.8 (ref 1.2–4.9)
T3 Uptake Ratio: 31 % (ref 24–39)
T4, Total: 5.7 ug/dL (ref 4.5–12.0)
TSH: 2.17 u[IU]/mL (ref 0.450–4.500)

## 2015-01-07 ENCOUNTER — Other Ambulatory Visit: Payer: Self-pay | Admitting: Pharmacist

## 2015-01-07 DIAGNOSIS — D72829 Elevated white blood cell count, unspecified: Secondary | ICD-10-CM

## 2015-01-17 DIAGNOSIS — M4316 Spondylolisthesis, lumbar region: Secondary | ICD-10-CM | POA: Diagnosis not present

## 2015-01-17 DIAGNOSIS — M47816 Spondylosis without myelopathy or radiculopathy, lumbar region: Secondary | ICD-10-CM | POA: Diagnosis not present

## 2015-01-17 DIAGNOSIS — M5126 Other intervertebral disc displacement, lumbar region: Secondary | ICD-10-CM | POA: Diagnosis not present

## 2015-01-22 ENCOUNTER — Telehealth: Payer: Self-pay | Admitting: Family Medicine

## 2015-01-23 DIAGNOSIS — K056 Periodontal disease, unspecified: Secondary | ICD-10-CM | POA: Diagnosis not present

## 2015-01-23 DIAGNOSIS — N529 Male erectile dysfunction, unspecified: Secondary | ICD-10-CM | POA: Diagnosis not present

## 2015-01-23 DIAGNOSIS — E669 Obesity, unspecified: Secondary | ICD-10-CM | POA: Insufficient documentation

## 2015-01-23 DIAGNOSIS — Z87891 Personal history of nicotine dependence: Secondary | ICD-10-CM | POA: Diagnosis not present

## 2015-01-23 DIAGNOSIS — D72829 Elevated white blood cell count, unspecified: Secondary | ICD-10-CM | POA: Insufficient documentation

## 2015-01-23 MED ORDER — SITAGLIPTIN PHOSPHATE 100 MG PO TABS
100.0000 mg | ORAL_TABLET | Freq: Every day | ORAL | Status: DC
Start: 2015-01-23 — End: 2015-04-15

## 2015-01-23 NOTE — Telephone Encounter (Signed)
Left samples at front desk and patient notified

## 2015-01-31 DIAGNOSIS — M9901 Segmental and somatic dysfunction of cervical region: Secondary | ICD-10-CM | POA: Diagnosis not present

## 2015-01-31 DIAGNOSIS — M5136 Other intervertebral disc degeneration, lumbar region: Secondary | ICD-10-CM | POA: Diagnosis not present

## 2015-01-31 DIAGNOSIS — M47812 Spondylosis without myelopathy or radiculopathy, cervical region: Secondary | ICD-10-CM | POA: Diagnosis not present

## 2015-01-31 DIAGNOSIS — M47816 Spondylosis without myelopathy or radiculopathy, lumbar region: Secondary | ICD-10-CM | POA: Diagnosis not present

## 2015-01-31 DIAGNOSIS — M9903 Segmental and somatic dysfunction of lumbar region: Secondary | ICD-10-CM | POA: Diagnosis not present

## 2015-02-02 ENCOUNTER — Other Ambulatory Visit: Payer: Self-pay | Admitting: Pharmacist

## 2015-02-02 ENCOUNTER — Other Ambulatory Visit: Payer: Self-pay | Admitting: Family Medicine

## 2015-02-04 DIAGNOSIS — E1142 Type 2 diabetes mellitus with diabetic polyneuropathy: Secondary | ICD-10-CM | POA: Diagnosis not present

## 2015-02-06 ENCOUNTER — Other Ambulatory Visit: Payer: Self-pay

## 2015-02-06 MED ORDER — ISOSORBIDE MONONITRATE ER 30 MG PO TB24: 30.0000 mg | ORAL_TABLET | Freq: Every day | ORAL | Status: DC

## 2015-02-06 NOTE — Telephone Encounter (Signed)
Last seen 08/27/14  Dr Livia Snellen  This med was not on EPIC list  Came over from [facility]

## 2015-02-09 ENCOUNTER — Telehealth: Payer: Self-pay | Admitting: Pharmacist

## 2015-02-11 ENCOUNTER — Encounter: Payer: Self-pay | Admitting: Family Medicine

## 2015-02-11 ENCOUNTER — Ambulatory Visit (INDEPENDENT_AMBULATORY_CARE_PROVIDER_SITE_OTHER): Payer: Medicare Other | Admitting: Family Medicine

## 2015-02-11 ENCOUNTER — Telehealth: Payer: Self-pay | Admitting: Family Medicine

## 2015-02-11 VITALS — BP 170/82 | HR 63 | Temp 97.6°F | Wt 276.4 lb

## 2015-02-11 DIAGNOSIS — R7989 Other specified abnormal findings of blood chemistry: Secondary | ICD-10-CM

## 2015-02-11 DIAGNOSIS — E669 Obesity, unspecified: Secondary | ICD-10-CM

## 2015-02-11 DIAGNOSIS — K219 Gastro-esophageal reflux disease without esophagitis: Secondary | ICD-10-CM | POA: Diagnosis not present

## 2015-02-11 DIAGNOSIS — E1169 Type 2 diabetes mellitus with other specified complication: Secondary | ICD-10-CM | POA: Diagnosis not present

## 2015-02-11 DIAGNOSIS — I10 Essential (primary) hypertension: Secondary | ICD-10-CM | POA: Diagnosis not present

## 2015-02-11 DIAGNOSIS — E291 Testicular hypofunction: Secondary | ICD-10-CM

## 2015-02-11 DIAGNOSIS — G629 Polyneuropathy, unspecified: Secondary | ICD-10-CM

## 2015-02-11 DIAGNOSIS — E118 Type 2 diabetes mellitus with unspecified complications: Secondary | ICD-10-CM | POA: Diagnosis not present

## 2015-02-11 DIAGNOSIS — E785 Hyperlipidemia, unspecified: Secondary | ICD-10-CM | POA: Diagnosis not present

## 2015-02-11 DIAGNOSIS — E1142 Type 2 diabetes mellitus with diabetic polyneuropathy: Secondary | ICD-10-CM

## 2015-02-11 DIAGNOSIS — M5030 Other cervical disc degeneration, unspecified cervical region: Secondary | ICD-10-CM | POA: Diagnosis not present

## 2015-02-11 LAB — POCT GLYCOSYLATED HEMOGLOBIN (HGB A1C): Hemoglobin A1C: 10

## 2015-02-11 LAB — GLUCOSE, POCT (MANUAL RESULT ENTRY): POC Glucose: 225 mg/dl — AB (ref 70–99)

## 2015-02-11 MED ORDER — ALPRAZOLAM ER 0.5 MG PO TB24
0.5000 mg | ORAL_TABLET | Freq: Two times a day (BID) | ORAL | Status: DC | PRN
Start: 2015-02-11 — End: 2015-05-29

## 2015-02-11 NOTE — Telephone Encounter (Signed)
Patient was given samples in office today by Jake Michaelis.

## 2015-02-11 NOTE — Patient Instructions (Signed)
Basic Carbohydrate Counting for Diabetes Mellitus Carbohydrate counting is a method for keeping track of the amount of carbohydrates you eat. Eating carbohydrates naturally increases the level of sugar (glucose) in your blood, so it is important for you to know the amount that is okay for you to have in every meal. Carbohydrate counting helps keep the level of glucose in your blood within normal limits. The amount of carbohydrates allowed is different for every person. A dietitian can help you calculate the amount that is right for you. Once you know the amount of carbohydrates you can have, you can count the carbohydrates in the foods you want to eat. Carbohydrates are found in the following foods:  Grains, such as breads and cereals.  Dried beans and soy products.  Starchy vegetables, such as potatoes, peas, and corn.  Fruit and fruit juices.  Milk and yogurt.  Sweets and snack foods, such as cake, cookies, candy, chips, soft drinks, and fruit drinks. CARBOHYDRATE COUNTING There are two ways to count the carbohydrates in your food. You can use either of the methods or a combination of both. Reading the "Nutrition Facts" on Packaged Food The "Nutrition Facts" is an area that is included on the labels of almost all packaged food and beverages in the [place]. It includes the serving size of that food or beverage and information about the nutrients in each serving of the food, including the grams (g) of carbohydrate per serving.  Decide the number of servings of this food or beverage that you will be able to eat or drink. Multiply that number of servings by the number of grams of carbohydrate that is listed on the label for that serving. The total will be the amount of carbohydrates you will be having when you eat or drink this food or beverage. Learning Standard Serving Sizes of Food When you eat food that is not packaged or does not include "Nutrition Facts" on the label, you need to  measure the servings in order to count the amount of carbohydrates.A serving of most carbohydrate-rich foods contains about 15 g of carbohydrates. The following list includes serving sizes of carbohydrate-rich foods that provide 15 g ofcarbohydrate per serving:   1 slice of bread (1 oz) or 1 six-inch tortilla.    of a hamburger bun or English muffin.  4-6 crackers.   cup unsweetened dry cereal.    cup hot cereal.   cup rice or pasta.    cup mashed potatoes or  of a large baked potato.  1 cup fresh fruit or one small piece of fruit.    cup canned or frozen fruit or fruit juice.  1 cup milk.   cup plain fat-free yogurt or yogurt sweetened with artificial sweeteners.   cup cooked dried beans or starchy vegetable, such as peas, corn, or potatoes.  Decide the number of standard-size servings that you will eat. Multiply that number of servings by 15 (the grams of carbohydrates in that serving). For example, if you eat 2 cups of strawberries, you will have eaten 2 servings and 30 g of carbohydrates (2 servings x 15 g = 30 g). For foods such as soups and casseroles, in which more than one food is mixed in, you will need to count the carbohydrates in each food that is included. EXAMPLE OF CARBOHYDRATE COUNTING Sample Dinner  3 oz chicken breast.   cup of brown rice.   cup of corn.  1 cup milk.   1 cup strawberries with   sugar-free whipped topping.  Carbohydrate Calculation Step 1: Identify the foods that contain carbohydrates:   Rice.   Corn.   Milk.   Strawberries. Step 2:Calculate the number of servings eaten of each:   2 servings of rice.   1 serving of corn.   1 serving of milk.   1 serving of strawberries. Step 3: Multiply each of those number of servings by 15 g:   2 servings of rice x 15 g = 30 g.   1 serving of corn x 15 g = 15 g.   1 serving of milk x 15 g = 15 g.   1 serving of strawberries x 15 g = 15 g. Step 4: Add  together all of the amounts to find the total grams of carbohydrates eaten: 30 g + 15 g + 15 g + 15 g = 75 g. Document Released: 05/04/2005 Document Revised: 09/18/2013 Document Reviewed: 03/31/2013 ExitCare Patient Information 2015 [employer]. This information is not intended to replace advice given to you by your health care provider. Make sure you discuss any questions you have with your health care provider. Diabetes and Foot Care Diabetes may cause you to have problems because of poor blood supply (circulation) to your feet and legs. This may cause the skin on your feet to become thinner, break easier, and heal more slowly. Your skin may become dry, and the skin may peel and crack. You may also have nerve damage in your legs and feet causing decreased feeling in them. You may not notice minor injuries to your feet that could lead to infections or more serious problems. Taking care of your feet is one of the most important things you can do for yourself.  HOME CARE INSTRUCTIONS  Wear shoes at all times, even in the house. Do not go barefoot. Bare feet are easily injured.  Check your feet daily for blisters, cuts, and redness. If you cannot see the bottom of your feet, use a mirror or ask someone for help.  Wash your feet with warm water (do not use hot water) and mild soap. Then pat your feet and the areas between your toes until they are completely dry. Do not soak your feet as this can dry your skin.  Apply a moisturizing lotion or petroleum jelly (that does not contain alcohol and is unscented) to the skin on your feet and to dry, brittle toenails. Do not apply lotion between your toes.  Trim your toenails straight across. Do not dig under them or around the cuticle. File the edges of your nails with an emery board or nail file.  Do not cut corns or calluses or try to remove them with medicine.  Wear clean socks or stockings every day. Make sure they are not too tight. Do not wear  knee-high stockings since they may decrease blood flow to your legs.  Wear shoes that fit properly and have enough cushioning. To break in new shoes, wear them for just a few hours a day. This prevents you from injuring your feet. Always look in your shoes before you put them on to be sure there are no objects inside.  Do not cross your legs. This may decrease the blood flow to your feet.  If you find a minor scrape, cut, or break in the skin on your feet, keep it and the skin around it clean and dry. These areas may be cleansed with mild soap and water. Do not cleanse the area with peroxide,   alcohol, or iodine.  When you remove an adhesive bandage, be sure not to damage the skin around it.  If you have a wound, look at it several times a day to make sure it is healing.  Do not use heating pads or hot water bottles. They may burn your skin. If you have lost feeling in your feet or legs, you may not know it is happening until it is too late.  Make sure your health care provider performs a complete foot exam at least annually or more often if you have foot problems. Report any cuts, sores, or bruises to your health care provider immediately. SEEK MEDICAL CARE IF:   You have an injury that is not healing.  You have cuts or breaks in the skin.  You have an ingrown nail.  You notice redness on your legs or feet.  You feel burning or tingling in your legs or feet.  You have pain or cramps in your legs and feet.  Your legs or feet are numb.  Your feet always feel cold. SEEK IMMEDIATE MEDICAL CARE IF:   There is increasing redness, swelling, or pain in or around a wound.  There is a red line that goes up your leg.  Pus is coming from a wound.  You develop a fever or as directed by your health care provider.  You notice a bad smell coming from an ulcer or wound. Document Released: 05/01/2000 Document Revised: 01/04/2013 Document Reviewed: 10/11/2012 ExitCare Patient Information  2015 [employer]. This information is not intended to replace advice given to you by your health care provider. Make sure you discuss any questions you have with your health care provider.  

## 2015-02-11 NOTE — Telephone Encounter (Signed)
Alprazolam prescription in computer/phone in. Thanks, WS.

## 2015-02-11 NOTE — Progress Notes (Signed)
Subjective:  Patient ID: [name], male    DOB: 02-Jun-1950  Age: 64 y.o. MRN: [id number]  CC: Diabetes; Hypertension; and Hyperlipidemia   HPI Latavious A Oddo presents for  follow-up of hypertension. Patient has no history of headache chest pain or shortness of breath or recent cough. Patient also denies symptoms of TIA such as numbness weakness lateralizing. Patient checks  blood pressure at home and has not had any elevated readings recently. Patient denies side effects from his medication. States taking it regularly.  Patient also  in for follow-up of elevated cholesterol. Doing well without complaints on current medication. Denies side effects of statin including myalgia and arthralgia and nausea. Also in today for liver function testing. Currently no chest pain, shortness of breath or other cardiovascular related symptoms noted.  Follow-up of diabetes. Patient does not check blood sugar at home. Not interested in starting. "everybody has to die of something."  Patient denies symptoms such as polyuria, polydipsia, excessive hunger, nausea No significant hypoglycemic spells noted. Medications as noted below. Taking them regularly without complication/adverse reaction being reported today.   Patient has taken Xanax in the past for anxieties. Has not taken recently but circumstances have close to become more anxious, nervous, dysphoric. Would like a refill on Xanax.   History Barnabas has a past medical history of Diabetes mellitus; Hypertension; GERD (gastroesophageal reflux disease); Arthritis; CAD (coronary artery disease); AODM; Disorders of iron metabolism; MURMUR; CHEST PAIN-UNSPECIFIED; DISORDERS OF IRON METABOLISM; Medically noncompliant; Cellulitis and abscess rt groin; Hyperlipidemia; and Low serum testosterone level.   He has past surgical history that includes Coronary stent placement (2000); Hernia repair (2000); Back surgery (2015); Lesion removal; Esophagogastroduodenoscopy;  Colonoscopy (N/A, 10/01/2014); and Esophagogastroduodenoscopy (N/A, 10/01/2014).   His family history includes Alzheimer's disease in his mother; Arthritis in his father, mother, and sister; Cancer in his mother and paternal uncle; Dementia in his maternal aunt and maternal uncle; Diabetes in his father and sister; Heart disease in his father and maternal uncle; Hyperlipidemia in his mother and sister; Hypertension in his mother and sister; Valvular heart disease in his father. There is no history of Colon cancer or Liver disease.He reports that he has been smoking Cigarettes.  He has a 12.75 pack-year smoking history. He has never used smokeless tobacco. He reports that he does not drink alcohol or use illicit drugs.  Current Outpatient Prescriptions on File Prior to Visit  Medication Sig Dispense Refill  . aspirin EC 81 MG tablet Take 81 mg by mouth daily.    . furosemide (LASIX) 20 MG tablet TAKE ONE TABLET BY MOUTH ONCE DAILY AS NEEDED 30 tablet 2  . gabapentin (NEURONTIN) 300 MG capsule Take 1 capsule (300 mg total) by mouth 3 (three) times daily. (Patient taking differently: Take 300 mg by mouth 2 (two) times daily. ) 90 capsule 11  . insulin lispro (HUMALOG KWIKPEN) 100 UNIT/ML KiwkPen Use at lunch as needed for elevated BG - 250 to 300 give 5 units and if 301 or above give 10 units 15 mL 1  . Insulin Pen Needle (PEN NEEDLES) 31G X 6 MM MISC Use to inject insulin two to three times a day 100 each 3  . isosorbide mononitrate (IMDUR) 30 MG 24 hr tablet Take 1 tablet (30 mg total) by mouth daily. 30 tablet 0  . Lactobacillus (PROBIOTIC ACIDOPHILUS PO) Take 1 capsule by mouth daily.    . meloxicam (MOBIC) 15 MG tablet Take 15 mg by mouth daily.     Marland Kitchen  metoprolol tartrate (LOPRESSOR) 25 MG tablet Take 0.5 tablets (12.5 mg total) by mouth 2 (two) times daily. 90 tablet 0  . Omega-3 Fatty Acids (FISH OIL) 1000 MG CAPS Take 2 capsules by mouth 2 (two) times daily.     Marland Kitchen omeprazole (PRILOSEC) 40 MG  capsule TAKE ONE CAPSULE BY MOUTH TWICE DAILY HALF AN HOUR BEFORE BREAKFAST AND SUPPER 60 capsule 4  . Red Yeast Rice Extract (RED YEAST RICE PO) Take 2 tablets by mouth daily.     . sitaGLIPtin (JANUVIA) 100 MG tablet Take 1 tablet (100 mg total) by mouth daily. 21 tablet 0  . telmisartan (MICARDIS) 40 MG tablet TAKE ONE TABLET BY MOUTH ONCE DAILY (REPLACES LISINOPRIL) 30 tablet 4   No current facility-administered medications on file prior to visit.    ROS Review of Systems  Constitutional: Negative for fever, chills and diaphoresis.  HENT: Negative for congestion, rhinorrhea and sore throat.   Respiratory: Negative for cough, shortness of breath and wheezing.   Cardiovascular: Negative for chest pain.  Gastrointestinal: Negative for nausea, vomiting, abdominal pain, diarrhea, constipation and abdominal distention.  Genitourinary: Negative for dysuria and frequency.  Musculoskeletal: Negative for joint swelling and arthralgias.  Skin: Negative for rash.  Neurological: Negative for headaches.    Objective:  BP 170/82 mmHg  Pulse 63  Temp(Src) 97.6 F (36.4 C) (Oral)  Wt 276 lb 6.4 oz (125.374 kg)  BP Readings from Last 3 Encounters:  02/11/15 170/82  01/03/15 140/80  11/08/14 158/82    Wt Readings from Last 3 Encounters:  02/11/15 276 lb 6.4 oz (125.374 kg)  01/03/15 281 lb 8 oz (127.688 kg)  11/08/14 279 lb (126.554 kg)     Physical Exam  Constitutional: He is oriented to person, place, and time. He appears well-developed and well-nourished. No distress.  HENT:  Head: Normocephalic and atraumatic.  Right Ear: External ear normal.  Left Ear: External ear normal.  Nose: Nose normal.  Mouth/Throat: Oropharynx is clear and moist.  Eyes: Conjunctivae and EOM are normal. Pupils are equal, round, and reactive to light.  Neck: Normal range of motion. Neck supple. No thyromegaly present.  Cardiovascular: Normal rate, regular rhythm and normal heart sounds.   No murmur  heard. Pulmonary/Chest: Effort normal and breath sounds normal. No respiratory distress. He has no wheezes. He has no rales.  Abdominal: Soft. Bowel sounds are normal. He exhibits no distension. There is no tenderness.  Lymphadenopathy:    He has no cervical adenopathy.  Neurological: He is alert and oriented to person, place, and time. He has normal reflexes.  Skin: Skin is warm and dry.  Psychiatric: He has a normal mood and affect. His behavior is normal. Judgment and thought content normal.    Lab Results  Component Value Date   HGBA1C 10.0 02/11/2015   HGBA1C 8.1 11/08/2014   HGBA1C 9.2* 07/31/2014    Lab Results  Component Value Date   WBC 11.2* 01/03/2015   HGB 15.9 08/13/2014   HCT 45.1 01/03/2015   PLT 209 07/30/2014   GLUCOSE 248* 11/08/2014   CHOL 253* 11/08/2014   TRIG 458* 11/08/2014   HDL 25* 11/08/2014   LDLDIRECT 172* 11/08/2014   LDLCALC Comment 11/08/2014   ALT 26 11/08/2014   AST 21 11/08/2014   NA 136 11/08/2014   K 4.7 11/08/2014   CL 93* 11/08/2014   CREATININE 1.04 11/08/2014   BUN 19 11/08/2014   CO2 24 11/08/2014   TSH 2.170 01/03/2015   PSA 1.1  05/31/2013   INR 0.99 10/25/2011   HGBA1C 10.0 02/11/2015    Mr Lumbar Spine Wo Contrast  12/27/2014   CLINICAL DATA:  Lumbar spondylosis.  Left leg pain and numbness  EXAM: MRI LUMBAR SPINE WITHOUT CONTRAST  TECHNIQUE: Multiplanar, multisequence MR imaging of the lumbar spine was performed. No intravenous contrast was administered.  COMPARISON:  Lumbar MRI 12/16/2010  FINDINGS: Grade 1 anterior slip L4-5 is similar to the prior study. Negative for fracture or mass. Conus medullaris normal and terminates at L1-2  L1-2:  Mild facet degeneration without stenosis.  L2-3: Small left paracentral disc protrusion has developed since the prior study. This appears to be causing impingement of the left L3 nerve root in the subarticular recess. There is bilateral facet hypertrophy and mild spinal stenosis at this  level.  L3-4: Mild retrolisthesis. Diffuse disc bulging. Moderate facet hypertrophy. Moderate spinal stenosis similar to the prior study. Left foraminal encroachment again noted with impingement of the left L3 nerve root in the foramen due to disc bulging and facet hypertrophy.  L4-5: 6 mm anterior slip. Advanced facet degeneration. Diffuse disc bulging. Mild foraminal narrowing bilaterally.  L5-S1: Disc degeneration and facet degeneration. Moderate right foraminal encroachment.  IMPRESSION: Interval development of small left paracentral disc protrusion at L2-3 with expected impingement of left L3 nerve root in the subarticular recess. There is severe left foraminal encroachment at L3-4 also causing L3 nerve root impingement. This is similar to the prior study  Moderate spinal stenosis at L3-4  Grade 1 anterior slip L4-5 related to facet degeneration. There is mild foraminal narrowing bilaterally at L4-5.   Electronically Signed   By: Franchot Gallo M.D.   On: 12/27/2014 20:43    Assessment & Plan:   Rajesh was seen today for diabetes, hypertension and hyperlipidemia.  Diagnoses and all orders for this visit:  Hyperlipidemia associated with type 2 diabetes mellitus -     CMP14+EGFR  Type 2 diabetes mellitus with complications -     POCT glycosylated hemoglobin (Hb A1C) -     Lipid panel -     POCT glucose (manual entry)  Essential hypertension, benign -     CBC with Differential/Platelet  Obesity (BMI 30.0-34.9)  Low serum testosterone level  Gastroesophageal reflux disease, esophagitis presence not specified  Diabetic peripheral neuropathy associated with type 2 diabetes mellitus  DDD (degenerative disc disease), cervical   I have discontinued Mr. Nichelson Insulin Lispro Prot & Lispro. I am also having him maintain his aspirin EC, gabapentin, metoprolol tartrate, meloxicam, Lactobacillus (PROBIOTIC ACIDOPHILUS PO), Red Yeast Rice Extract (RED YEAST RICE PO), Fish Oil, telmisartan,  omeprazole, insulin lispro, Pen Needles, sitaGLIPtin, furosemide, isosorbide mononitrate, and insulin NPH-regular Human.  Meds ordered this encounter  Medications  . insulin NPH-regular Human (NOVOLIN 70/30) (70-30) 100 UNIT/ML injection    Sig: Inject 80 Units into the skin 2 (two) times daily with a meal.   Diabetic foot care and basic carb counting handouts given.  Follow-up: Return in about 3 months (around 05/13/2015) for diabetes.  Claretta Fraise, M.D.

## 2015-02-11 NOTE — Telephone Encounter (Signed)
Please review and advise.

## 2015-02-11 NOTE — Telephone Encounter (Signed)
Pt notified of Rx Rx for Xanax called into [facility] per Dr Livia Snellen

## 2015-02-12 LAB — CMP14+EGFR
ALT: 27 IU/L (ref 0–44)
AST: 19 IU/L (ref 0–40)
Albumin/Globulin Ratio: 1.7 (ref 1.1–2.5)
Albumin: 4.3 g/dL (ref 3.6–4.8)
Alkaline Phosphatase: 89 IU/L (ref 39–117)
BUN/Creatinine Ratio: 15 (ref 10–22)
BUN: 17 mg/dL (ref 8–27)
Bilirubin Total: 0.4 mg/dL (ref 0.0–1.2)
CO2: 19 mmol/L (ref 18–29)
Calcium: 10.1 mg/dL (ref 8.6–10.2)
Chloride: 94 mmol/L — ABNORMAL LOW (ref 97–108)
Creatinine, Ser: 1.11 mg/dL (ref 0.76–1.27)
GFR calc Af Amer: 81 mL/min/{1.73_m2} (ref >59)
GFR calc non Af Amer: 70 mL/min/{1.73_m2} (ref >59)
Globulin, Total: 2.6 g/dL (ref 1.5–4.5)
Glucose: 254 mg/dL — ABNORMAL HIGH (ref 65–99)
Potassium: 4.5 mmol/L (ref 3.5–5.2)
Sodium: 135 mmol/L (ref 134–144)
Total Protein: 6.9 g/dL (ref 6.0–8.5)

## 2015-02-12 LAB — CBC WITH DIFFERENTIAL/PLATELET
Basophils Absolute: 0.1 10*3/uL (ref 0.0–0.2)
Basos: 1 %
EOS (ABSOLUTE): 0.2 10*3/uL (ref 0.0–0.4)
Eos: 2 %
Hematocrit: 44.5 % (ref 37.5–51.0)
Hemoglobin: 15.4 g/dL (ref 12.6–17.7)
Immature Grans (Abs): 0.1 10*3/uL (ref 0.0–0.1)
Immature Granulocytes: 1 %
Lymphocytes Absolute: 2.1 10*3/uL (ref 0.7–3.1)
Lymphs: 20 %
MCH: 29.8 pg (ref 26.6–33.0)
MCHC: 34.6 g/dL (ref 31.5–35.7)
MCV: 86 fL (ref 79–97)
Monocytes Absolute: 0.9 10*3/uL (ref 0.1–0.9)
Monocytes: 8 %
Neutrophils Absolute: 7.4 10*3/uL — ABNORMAL HIGH (ref 1.4–7.0)
Neutrophils: 68 %
Platelets: 261 10*3/uL (ref 150–379)
RBC: 5.16 x10E6/uL (ref 4.14–5.80)
RDW: 13.2 % (ref 12.3–15.4)
WBC: 10.7 10*3/uL (ref 3.4–10.8)

## 2015-02-12 LAB — LIPID PANEL
Chol/HDL Ratio: 9.3 ratio units — ABNORMAL HIGH (ref 0.0–5.0)
Cholesterol, Total: 243 mg/dL — ABNORMAL HIGH (ref 100–199)
HDL: 26 mg/dL — ABNORMAL LOW (ref >39)
Triglycerides: 408 mg/dL — ABNORMAL HIGH (ref 0–149)

## 2015-02-13 MED ORDER — EZETIMIBE 10 MG PO TABS: 10.0000 mg | ORAL_TABLET | Freq: Every day | ORAL | Status: DC

## 2015-02-13 NOTE — Addendum Note (Signed)
Addended by: Shelbie Ammons on: 02/13/2015 12:01 PM   Modules accepted: Orders

## 2015-02-14 DIAGNOSIS — M5116 Intervertebral disc disorders with radiculopathy, lumbar region: Secondary | ICD-10-CM | POA: Diagnosis not present

## 2015-02-14 DIAGNOSIS — M4316 Spondylolisthesis, lumbar region: Secondary | ICD-10-CM | POA: Diagnosis not present

## 2015-02-22 DIAGNOSIS — I10 Essential (primary) hypertension: Secondary | ICD-10-CM | POA: Diagnosis not present

## 2015-02-22 DIAGNOSIS — Z8673 Personal history of transient ischemic attack (TIA), and cerebral infarction without residual deficits: Secondary | ICD-10-CM | POA: Diagnosis not present

## 2015-02-22 DIAGNOSIS — M47816 Spondylosis without myelopathy or radiculopathy, lumbar region: Secondary | ICD-10-CM | POA: Diagnosis not present

## 2015-02-22 DIAGNOSIS — Z888 Allergy status to other drugs, medicaments and biological substances status: Secondary | ICD-10-CM | POA: Diagnosis not present

## 2015-02-22 DIAGNOSIS — E119 Type 2 diabetes mellitus without complications: Secondary | ICD-10-CM | POA: Diagnosis not present

## 2015-02-22 DIAGNOSIS — M5136 Other intervertebral disc degeneration, lumbar region: Secondary | ICD-10-CM | POA: Diagnosis not present

## 2015-02-22 DIAGNOSIS — K219 Gastro-esophageal reflux disease without esophagitis: Secondary | ICD-10-CM | POA: Diagnosis not present

## 2015-02-22 DIAGNOSIS — I2510 Atherosclerotic heart disease of native coronary artery without angina pectoris: Secondary | ICD-10-CM | POA: Diagnosis not present

## 2015-02-22 DIAGNOSIS — M4806 Spinal stenosis, lumbar region: Secondary | ICD-10-CM | POA: Diagnosis not present

## 2015-02-22 DIAGNOSIS — Z794 Long term (current) use of insulin: Secondary | ICD-10-CM | POA: Diagnosis not present

## 2015-02-22 DIAGNOSIS — G8929 Other chronic pain: Secondary | ICD-10-CM | POA: Diagnosis not present

## 2015-02-22 DIAGNOSIS — Z87891 Personal history of nicotine dependence: Secondary | ICD-10-CM | POA: Diagnosis not present

## 2015-02-22 DIAGNOSIS — E785 Hyperlipidemia, unspecified: Secondary | ICD-10-CM | POA: Diagnosis not present

## 2015-02-22 DIAGNOSIS — Z79899 Other long term (current) drug therapy: Secondary | ICD-10-CM | POA: Diagnosis not present

## 2015-02-22 DIAGNOSIS — Z955 Presence of coronary angioplasty implant and graft: Secondary | ICD-10-CM | POA: Diagnosis not present

## 2015-02-22 DIAGNOSIS — Z7982 Long term (current) use of aspirin: Secondary | ICD-10-CM | POA: Diagnosis not present

## 2015-03-05 ENCOUNTER — Encounter: Payer: Self-pay | Admitting: Family Medicine

## 2015-03-09 ENCOUNTER — Other Ambulatory Visit: Payer: Self-pay | Admitting: Family Medicine

## 2015-03-15 ENCOUNTER — Ambulatory Visit: Payer: Self-pay | Admitting: Pharmacist

## 2015-03-28 DIAGNOSIS — M5020 Other cervical disc displacement, unspecified cervical region: Secondary | ICD-10-CM | POA: Diagnosis not present

## 2015-03-28 DIAGNOSIS — M5116 Intervertebral disc disorders with radiculopathy, lumbar region: Secondary | ICD-10-CM | POA: Diagnosis not present

## 2015-03-28 DIAGNOSIS — Z8673 Personal history of transient ischemic attack (TIA), and cerebral infarction without residual deficits: Secondary | ICD-10-CM | POA: Diagnosis not present

## 2015-03-28 DIAGNOSIS — M47812 Spondylosis without myelopathy or radiculopathy, cervical region: Secondary | ICD-10-CM | POA: Diagnosis not present

## 2015-03-31 ENCOUNTER — Other Ambulatory Visit: Payer: Self-pay | Admitting: Family Medicine

## 2015-04-15 ENCOUNTER — Telehealth: Payer: Self-pay | Admitting: Family Medicine

## 2015-04-15 MED ORDER — SITAGLIPTIN PHOSPHATE 100 MG PO TABS: 100.0000 mg | ORAL_TABLET | Freq: Every day | ORAL | Status: DC

## 2015-04-15 MED ORDER — LINACLOTIDE 145 MCG PO CAPS: 145.0000 ug | ORAL_CAPSULE | ORAL | Status: DC | PRN

## 2015-04-15 NOTE — Telephone Encounter (Signed)
I saw that Linzess had been given about 1 year ago.  Patient states that he only takes about 1 to 2 times per month as needed.  #8 Linzess given and #28 Januvia 1 tablet daily given.

## 2015-04-22 DIAGNOSIS — M5442 Lumbago with sciatica, left side: Secondary | ICD-10-CM | POA: Diagnosis not present

## 2015-04-22 DIAGNOSIS — M47816 Spondylosis without myelopathy or radiculopathy, lumbar region: Secondary | ICD-10-CM | POA: Diagnosis not present

## 2015-04-22 DIAGNOSIS — M9903 Segmental and somatic dysfunction of lumbar region: Secondary | ICD-10-CM | POA: Diagnosis not present

## 2015-04-23 DIAGNOSIS — M9903 Segmental and somatic dysfunction of lumbar region: Secondary | ICD-10-CM | POA: Diagnosis not present

## 2015-04-23 DIAGNOSIS — M47816 Spondylosis without myelopathy or radiculopathy, lumbar region: Secondary | ICD-10-CM | POA: Diagnosis not present

## 2015-04-23 DIAGNOSIS — M5442 Lumbago with sciatica, left side: Secondary | ICD-10-CM | POA: Diagnosis not present

## 2015-04-25 DIAGNOSIS — M5442 Lumbago with sciatica, left side: Secondary | ICD-10-CM | POA: Diagnosis not present

## 2015-04-25 DIAGNOSIS — M47816 Spondylosis without myelopathy or radiculopathy, lumbar region: Secondary | ICD-10-CM | POA: Diagnosis not present

## 2015-04-25 DIAGNOSIS — M9903 Segmental and somatic dysfunction of lumbar region: Secondary | ICD-10-CM | POA: Diagnosis not present

## 2015-04-26 DIAGNOSIS — M9903 Segmental and somatic dysfunction of lumbar region: Secondary | ICD-10-CM | POA: Diagnosis not present

## 2015-04-26 DIAGNOSIS — M5442 Lumbago with sciatica, left side: Secondary | ICD-10-CM | POA: Diagnosis not present

## 2015-04-26 DIAGNOSIS — M47816 Spondylosis without myelopathy or radiculopathy, lumbar region: Secondary | ICD-10-CM | POA: Diagnosis not present

## 2015-04-28 ENCOUNTER — Other Ambulatory Visit: Payer: Self-pay | Admitting: Family Medicine

## 2015-05-06 ENCOUNTER — Other Ambulatory Visit: Payer: Self-pay | Admitting: Family Medicine

## 2015-05-06 DIAGNOSIS — Z794 Long term (current) use of insulin: Secondary | ICD-10-CM | POA: Diagnosis not present

## 2015-05-06 DIAGNOSIS — F17210 Nicotine dependence, cigarettes, uncomplicated: Secondary | ICD-10-CM | POA: Diagnosis not present

## 2015-05-06 DIAGNOSIS — M5136 Other intervertebral disc degeneration, lumbar region: Secondary | ICD-10-CM | POA: Diagnosis not present

## 2015-05-06 DIAGNOSIS — Z7982 Long term (current) use of aspirin: Secondary | ICD-10-CM | POA: Diagnosis not present

## 2015-05-06 DIAGNOSIS — Z955 Presence of coronary angioplasty implant and graft: Secondary | ICD-10-CM | POA: Diagnosis not present

## 2015-05-06 DIAGNOSIS — E119 Type 2 diabetes mellitus without complications: Secondary | ICD-10-CM | POA: Diagnosis not present

## 2015-05-06 DIAGNOSIS — M5116 Intervertebral disc disorders with radiculopathy, lumbar region: Secondary | ICD-10-CM | POA: Diagnosis not present

## 2015-05-06 DIAGNOSIS — Z888 Allergy status to other drugs, medicaments and biological substances status: Secondary | ICD-10-CM | POA: Diagnosis not present

## 2015-05-06 DIAGNOSIS — M4726 Other spondylosis with radiculopathy, lumbar region: Secondary | ICD-10-CM | POA: Diagnosis not present

## 2015-05-06 DIAGNOSIS — Z887 Allergy status to serum and vaccine status: Secondary | ICD-10-CM | POA: Diagnosis not present

## 2015-05-06 DIAGNOSIS — Z8673 Personal history of transient ischemic attack (TIA), and cerebral infarction without residual deficits: Secondary | ICD-10-CM | POA: Diagnosis not present

## 2015-05-06 DIAGNOSIS — E785 Hyperlipidemia, unspecified: Secondary | ICD-10-CM | POA: Diagnosis not present

## 2015-05-06 DIAGNOSIS — Z79899 Other long term (current) drug therapy: Secondary | ICD-10-CM | POA: Diagnosis not present

## 2015-05-06 DIAGNOSIS — K219 Gastro-esophageal reflux disease without esophagitis: Secondary | ICD-10-CM | POA: Diagnosis not present

## 2015-05-06 DIAGNOSIS — M4806 Spinal stenosis, lumbar region: Secondary | ICD-10-CM | POA: Diagnosis not present

## 2015-05-06 DIAGNOSIS — I2510 Atherosclerotic heart disease of native coronary artery without angina pectoris: Secondary | ICD-10-CM | POA: Diagnosis not present

## 2015-05-06 DIAGNOSIS — I10 Essential (primary) hypertension: Secondary | ICD-10-CM | POA: Diagnosis not present

## 2015-05-13 ENCOUNTER — Other Ambulatory Visit: Payer: Self-pay | Admitting: Family Medicine

## 2015-05-14 ENCOUNTER — Telehealth: Payer: Self-pay | Admitting: Pharmacist

## 2015-05-15 ENCOUNTER — Other Ambulatory Visit: Payer: Self-pay | Admitting: Family Medicine

## 2015-05-15 MED ORDER — SITAGLIPTIN PHOSPHATE 100 MG PO TABS: 100.0000 mg | ORAL_TABLET | Freq: Every day | ORAL | Status: DC

## 2015-05-15 NOTE — Telephone Encounter (Signed)
Needs appt.. betore any further scrips after this one

## 2015-05-15 NOTE — Telephone Encounter (Signed)
#  14 of Januvia 100mg  samples left at front desk for patient.  Patient notified.

## 2015-05-15 NOTE — Telephone Encounter (Signed)
Last filled 04/15/15, last seen 02/11/15. Call in at [facility]

## 2015-05-15 NOTE — Telephone Encounter (Signed)
RX called into [facility] per Dr Livia Snellen

## 2015-05-15 NOTE — Telephone Encounter (Signed)
Please review and advise.

## 2015-05-16 DIAGNOSIS — M9901 Segmental and somatic dysfunction of cervical region: Secondary | ICD-10-CM | POA: Diagnosis not present

## 2015-05-16 DIAGNOSIS — M546 Pain in thoracic spine: Secondary | ICD-10-CM | POA: Diagnosis not present

## 2015-05-16 DIAGNOSIS — M9903 Segmental and somatic dysfunction of lumbar region: Secondary | ICD-10-CM | POA: Diagnosis not present

## 2015-05-16 DIAGNOSIS — M47812 Spondylosis without myelopathy or radiculopathy, cervical region: Secondary | ICD-10-CM | POA: Diagnosis not present

## 2015-05-16 DIAGNOSIS — M9902 Segmental and somatic dysfunction of thoracic region: Secondary | ICD-10-CM | POA: Diagnosis not present

## 2015-05-24 ENCOUNTER — Encounter: Payer: Self-pay | Admitting: Surgery

## 2015-05-26 ENCOUNTER — Other Ambulatory Visit: Payer: Self-pay | Admitting: Family Medicine

## 2015-05-28 ENCOUNTER — Telehealth: Payer: Self-pay | Admitting: Pharmacist

## 2015-05-29 MED ORDER — SITAGLIPTIN PHOSPHATE 100 MG PO TABS: 100.0000 mg | ORAL_TABLET | Freq: Every day | ORAL | Status: DC

## 2015-05-29 NOTE — Telephone Encounter (Signed)
No samples of Januvia currently available.   I do have a coupon for 30 day free trial offer that he might be able to use.  Left coupon and Rx for 30 days supply at front desk

## 2015-05-30 DIAGNOSIS — M546 Pain in thoracic spine: Secondary | ICD-10-CM | POA: Diagnosis not present

## 2015-05-30 DIAGNOSIS — M9903 Segmental and somatic dysfunction of lumbar region: Secondary | ICD-10-CM | POA: Diagnosis not present

## 2015-05-30 DIAGNOSIS — M9901 Segmental and somatic dysfunction of cervical region: Secondary | ICD-10-CM | POA: Diagnosis not present

## 2015-05-30 DIAGNOSIS — M9902 Segmental and somatic dysfunction of thoracic region: Secondary | ICD-10-CM | POA: Diagnosis not present

## 2015-05-30 DIAGNOSIS — M47812 Spondylosis without myelopathy or radiculopathy, cervical region: Secondary | ICD-10-CM | POA: Diagnosis not present

## 2015-06-03 ENCOUNTER — Encounter (HOSPITAL_COMMUNITY): Payer: Medicare Other

## 2015-06-03 ENCOUNTER — Encounter (HOSPITAL_COMMUNITY): Payer: Self-pay

## 2015-06-03 ENCOUNTER — Ambulatory Visit: Payer: Medicare Other | Admitting: Surgery

## 2015-06-06 DIAGNOSIS — M9902 Segmental and somatic dysfunction of thoracic region: Secondary | ICD-10-CM | POA: Diagnosis not present

## 2015-06-06 DIAGNOSIS — M9901 Segmental and somatic dysfunction of cervical region: Secondary | ICD-10-CM | POA: Diagnosis not present

## 2015-06-06 DIAGNOSIS — M9903 Segmental and somatic dysfunction of lumbar region: Secondary | ICD-10-CM | POA: Diagnosis not present

## 2015-06-06 DIAGNOSIS — M546 Pain in thoracic spine: Secondary | ICD-10-CM | POA: Diagnosis not present

## 2015-06-06 DIAGNOSIS — M47812 Spondylosis without myelopathy or radiculopathy, cervical region: Secondary | ICD-10-CM | POA: Diagnosis not present

## 2015-06-13 ENCOUNTER — Telehealth: Payer: Self-pay | Admitting: Family Medicine

## 2015-06-13 ENCOUNTER — Ambulatory Visit (INDEPENDENT_AMBULATORY_CARE_PROVIDER_SITE_OTHER): Payer: Medicare Other | Admitting: Family Medicine

## 2015-06-13 ENCOUNTER — Encounter: Payer: Self-pay | Admitting: Family Medicine

## 2015-06-13 VITALS — BP 149/79 | HR 66 | Temp 96.6°F | Ht 74.0 in | Wt 280.4 lb

## 2015-06-13 DIAGNOSIS — E785 Hyperlipidemia, unspecified: Secondary | ICD-10-CM | POA: Diagnosis not present

## 2015-06-13 DIAGNOSIS — E1169 Type 2 diabetes mellitus with other specified complication: Secondary | ICD-10-CM | POA: Diagnosis not present

## 2015-06-13 DIAGNOSIS — E118 Type 2 diabetes mellitus with unspecified complications: Secondary | ICD-10-CM

## 2015-06-13 DIAGNOSIS — I10 Essential (primary) hypertension: Secondary | ICD-10-CM | POA: Diagnosis not present

## 2015-06-13 DIAGNOSIS — Z794 Long term (current) use of insulin: Secondary | ICD-10-CM | POA: Diagnosis not present

## 2015-06-13 LAB — POCT GLYCOSYLATED HEMOGLOBIN (HGB A1C): Hemoglobin A1C: 7.4

## 2015-06-13 MED ORDER — ALPRAZOLAM 0.5 MG PO TABS: 0.5000 mg | ORAL_TABLET | Freq: Two times a day (BID) | ORAL | Status: DC | PRN

## 2015-06-13 MED ORDER — METOPROLOL TARTRATE 25 MG PO TABS: 12.5000 mg | ORAL_TABLET | Freq: Two times a day (BID) | ORAL | Status: DC

## 2015-06-13 MED ORDER — MELOXICAM 15 MG PO TABS: 15.0000 mg | ORAL_TABLET | Freq: Every day | ORAL | Status: DC

## 2015-06-13 MED ORDER — EZETIMIBE 10 MG PO TABS: 10.0000 mg | ORAL_TABLET | Freq: Every day | ORAL | Status: DC

## 2015-06-13 MED ORDER — OMEPRAZOLE 40 MG PO CPDR: DELAYED_RELEASE_CAPSULE | ORAL | Status: DC

## 2015-06-13 MED ORDER — FUROSEMIDE 20 MG PO TABS: ORAL_TABLET | ORAL | Status: DC

## 2015-06-13 MED ORDER — GABAPENTIN 300 MG PO CAPS: 300.0000 mg | ORAL_CAPSULE | Freq: Three times a day (TID) | ORAL | Status: DC

## 2015-06-13 MED ORDER — MOMETASONE FUROATE 50 MCG/ACT NA SUSP: 2.0000 | Freq: Every day | NASAL | Status: DC

## 2015-06-13 MED ORDER — SITAGLIPTIN PHOSPHATE 100 MG PO TABS: 100.0000 mg | ORAL_TABLET | Freq: Every day | ORAL | Status: DC

## 2015-06-13 MED ORDER — TELMISARTAN 40 MG PO TABS: ORAL_TABLET | ORAL | Status: DC

## 2015-06-13 MED ORDER — INSULIN NPH ISOPHANE & REGULAR (70-30) 100 UNIT/ML ~~LOC~~ SUSP: 70.0000 [IU] | Freq: Two times a day (BID) | SUBCUTANEOUS | Status: DC

## 2015-06-13 NOTE — Telephone Encounter (Signed)
Patient was called and congratulated!  Keep up the great work!

## 2015-06-13 NOTE — Progress Notes (Signed)
Subjective:  Patient ID: [name] [name], male    DOB: 06-26-1950  Age: 65 y.o. MRN: [id number]  CC: Diabetes and Hyperlipidemia   HPI Patrice A Gappa presents for  follow-up of hypertension. Patient has no history of headache chest pain or shortness of breath or recent cough. Patient also denies symptoms of TIA such as numbness weakness lateralizing. Patient checks  blood pressure at home and has not had any elevated readings recently. Patient denies side effects from his medication. States taking it regularly.  Patient also  in for follow-up of elevated cholesterol. Doing well without complaints on current medication. Denies side effects of statin including myalgia and arthralgia and nausea. Also in today for liver function testing. Currently no chest pain, shortness of breath or other cardiovascular related symptoms noted.  Follow-up of diabetes. Just started a new diet. Patient does check blood sugar at home. Readings run between 114 and 323 Patient denies symptoms such as polyuria, polydipsia, excessive hunger, nausea A few significant hypoglycemic spells noted.As low as 40. Shaky. Medications as noted below. Taking them regularly without complication/adverse reaction being reported today. Decreased insulin due to glucose running low .Notes improvement in pain since   History Lynard has a past medical history of Diabetes mellitus; Hypertension; GERD (gastroesophageal reflux disease); Arthritis; CAD (coronary artery disease); AODM; Disorders of iron metabolism; MURMUR; CHEST PAIN-UNSPECIFIED; DISORDERS OF IRON METABOLISM; Medically noncompliant; Cellulitis and abscess rt groin; Hyperlipidemia; and Low serum testosterone level.   He has past surgical history that includes Coronary stent placement (2000); Hernia repair (2000); Back surgery (2015); Lesion removal; Esophagogastroduodenoscopy; Colonoscopy (N/A, 10/01/2014); and Esophagogastroduodenoscopy (N/A, 10/01/2014).   His family history includes  Alzheimer's disease in his mother; Arthritis in his father, mother, and sister; Cancer in his mother and paternal uncle; Dementia in his maternal aunt and maternal uncle; Diabetes in his father and sister; Heart disease in his father and maternal uncle; Hyperlipidemia in his mother and sister; Hypertension in his mother and sister; Valvular heart disease in his father. There is no history of Colon cancer or Liver disease.He reports that he has been smoking Cigarettes.  He has a 12.75 pack-year smoking history. He has never used smokeless tobacco. He reports that he does not drink alcohol or use illicit drugs.  Current Outpatient Prescriptions on File Prior to Visit  Medication Sig Dispense Refill  . aspirin EC 81 MG tablet Take 81 mg by mouth daily.    . Insulin Pen Needle (PEN NEEDLES) 31G X 6 MM MISC Use to inject insulin two to three times a day 100 each 3  . Lactobacillus (PROBIOTIC ACIDOPHILUS PO) Take 1 capsule by mouth daily.    . Linaclotide (LINZESS) 145 MCG CAPS capsule Take 1 capsule (145 mcg total) by mouth as needed. 8 capsule 0  . Red Yeast Rice Extract (RED YEAST RICE PO) Take 2 tablets by mouth daily.     . Omega-3 Fatty Acids (FISH OIL) 1000 MG CAPS Take 2 capsules by mouth 2 (two) times daily. Reported on 06/13/2015     No current facility-administered medications on file prior to visit.    ROS Review of Systems  Constitutional: Negative for fever, chills, diaphoresis and unexpected weight change.  HENT: Negative for congestion, hearing loss, rhinorrhea and sore throat.   Eyes: Negative for visual disturbance.  Respiratory: Negative for cough and shortness of breath.   Cardiovascular: Negative for chest pain.  Gastrointestinal: Negative for abdominal pain, diarrhea and constipation.  Genitourinary: Negative for dysuria and flank  pain.  Musculoskeletal: Negative for joint swelling and arthralgias.  Skin: Negative for rash.  Neurological: Negative for dizziness and headaches.    Psychiatric/Behavioral: Negative for sleep disturbance and dysphoric mood.    Objective:  BP 149/79 mmHg  Pulse 66  Temp(Src) 96.6 F (35.9 C) (Oral)  Ht '6\' 2"'$  (1.88 m)  Wt 280 lb 6.4 oz (127.189 kg)  BMI 35.99 kg/m2  BP Readings from Last 3 Encounters:  06/13/15 149/79  02/11/15 170/82  01/03/15 140/80    Wt Readings from Last 3 Encounters:  06/13/15 280 lb 6.4 oz (127.189 kg)  02/11/15 276 lb 6.4 oz (125.374 kg)  01/03/15 281 lb 8 oz (127.688 kg)     Physical Exam  Constitutional: He is oriented to person, place, and time. He appears well-developed and well-nourished. No distress.  HENT:  Head: Normocephalic and atraumatic.  Right Ear: External ear normal.  Left Ear: External ear normal.  Nose: Nose normal.  Mouth/Throat: Oropharynx is clear and moist.  Eyes: Conjunctivae and EOM are normal. Pupils are equal, round, and reactive to light.  Neck: Normal range of motion. Neck supple. No thyromegaly present.  Cardiovascular: Normal rate, regular rhythm and normal heart sounds.   No murmur heard. Pulmonary/Chest: Effort normal and breath sounds normal. No respiratory distress. He has no wheezes. He has no rales.  Abdominal: Soft. Bowel sounds are normal. He exhibits no distension. There is no tenderness.  Lymphadenopathy:    He has no cervical adenopathy.  Neurological: He is alert and oriented to person, place, and time. He has normal reflexes.  Skin: Skin is warm and dry.  Psychiatric: He has a normal mood and affect. His behavior is normal. Judgment and thought content normal.    Lab Results  Component Value Date   HGBA1C 7.4 06/13/2015   HGBA1C 10.0 02/11/2015   HGBA1C 8.1 11/08/2014    Lab Results  Component Value Date   WBC 10.7 02/11/2015   HGB 15.9 08/13/2014   HCT 44.5 02/11/2015   PLT 261 02/11/2015   GLUCOSE 163* 06/13/2015   CHOL 238* 06/13/2015   TRIG 234* 06/13/2015   HDL 24* 06/13/2015   LDLDIRECT 172* 11/08/2014   LDLCALC 167* 06/13/2015    ALT 23 06/13/2015   AST 20 06/13/2015   NA 138 06/13/2015   K 4.6 06/13/2015   CL 96 06/13/2015   CREATININE 1.13 06/13/2015   BUN 26 06/13/2015   CO2 23 06/13/2015   TSH 2.170 01/03/2015   PSA 1.1 05/31/2013   INR 0.99 10/25/2011   HGBA1C 7.4 06/13/2015    Mr Lumbar Spine Wo Contrast  12/27/2014  CLINICAL DATA:  Lumbar spondylosis.  Left leg pain and numbness EXAM: MRI LUMBAR SPINE WITHOUT CONTRAST TECHNIQUE: Multiplanar, multisequence MR imaging of the lumbar spine was performed. No intravenous contrast was administered. COMPARISON:  Lumbar MRI 12/16/2010 FINDINGS: Grade 1 anterior slip L4-5 is similar to the prior study. Negative for fracture or mass. Conus medullaris normal and terminates at L1-2 L1-2:  Mild facet degeneration without stenosis. L2-3: Small left paracentral disc protrusion has developed since the prior study. This appears to be causing impingement of the left L3 nerve root in the subarticular recess. There is bilateral facet hypertrophy and mild spinal stenosis at this level. L3-4: Mild retrolisthesis. Diffuse disc bulging. Moderate facet hypertrophy. Moderate spinal stenosis similar to the prior study. Left foraminal encroachment again noted with impingement of the left L3 nerve root in the foramen due to disc bulging and facet hypertrophy. L4-5:  6 mm anterior slip. Advanced facet degeneration. Diffuse disc bulging. Mild foraminal narrowing bilaterally. L5-S1: Disc degeneration and facet degeneration. Moderate right foraminal encroachment. IMPRESSION: Interval development of small left paracentral disc protrusion at L2-3 with expected impingement of left L3 nerve root in the subarticular recess. There is severe left foraminal encroachment at L3-4 also causing L3 nerve root impingement. This is similar to the prior study Moderate spinal stenosis at L3-4 Grade 1 anterior slip L4-5 related to facet degeneration. There is mild foraminal narrowing bilaterally at L4-5. Electronically  Signed   By: Franchot Gallo M.D.   On: 12/27/2014 20:43    Assessment & Plan:   Oluwaferanmi was seen today for diabetes and hyperlipidemia.  Diagnoses and all orders for this visit:  Type 2 diabetes mellitus with complication, with long-term current use of insulin (HCC) -     POCT glycosylated hemoglobin (Hb A1C) -     CMP14+EGFR -     Lipid panel -     Microalbumin / creatinine urine ratio  Hyperlipidemia associated with type 2 diabetes mellitus (HCC)  Essential hypertension, benign  Other orders -     Discontinue: ALPRAZolam (XANAX) 0.5 MG tablet; Take 1 tablet (0.5 mg total) by mouth 2 (two) times daily as needed. for anxiety -     mometasone (NASONEX) 50 MCG/ACT nasal spray; Place 2 sprays into the nose daily. -     ALPRAZolam (XANAX) 0.5 MG tablet; Take 1 tablet (0.5 mg total) by mouth 2 (two) times daily as needed. for anxiety -     ezetimibe (ZETIA) 10 MG tablet; Take 1 tablet (10 mg total) by mouth daily. -     furosemide (LASIX) 20 MG tablet; TAKE ONE TABLET BY MOUTH ONCE DAILY AS NEEDED -     gabapentin (NEURONTIN) 300 MG capsule; Take 1 capsule (300 mg total) by mouth 3 (three) times daily. -     meloxicam (MOBIC) 15 MG tablet; Take 1 tablet (15 mg total) by mouth daily. -     metoprolol tartrate (LOPRESSOR) 25 MG tablet; Take 0.5 tablets (12.5 mg total) by mouth 2 (two) times daily. -     insulin NPH-regular Human (NOVOLIN 70/30 RELION) (70-30) 100 UNIT/ML injection; Inject 70 Units into the skin 2 (two) times daily with a meal. -     omeprazole (PRILOSEC) 40 MG capsule; TAKE ONE CAPSULE BY MOUTH TWICE DAILY HALF AN HOUR BEFORE BREAKFAST AND SUPPER -     sitaGLIPtin (JANUVIA) 100 MG tablet; Take 1 tablet (100 mg total) by mouth daily. -     telmisartan (MICARDIS) 40 MG tablet; TAKE ONE TABLET BY MOUTH ONCE DAILY (REPLACING  LISINOPRIL)   I have discontinued Mr. Fulford insulin lispro and isosorbide mononitrate. I have changed his NOVOLIN 70/30 RELION to insulin NPH-regular  Human. I have also changed his gabapentin and meloxicam. Additionally, I am having him start on mometasone. Lastly, I am having him maintain his aspirin EC, Lactobacillus (PROBIOTIC ACIDOPHILUS PO), Red Yeast Rice Extract (RED YEAST RICE PO), Fish Oil, Pen Needles, Linaclotide, ALPRAZolam, ezetimibe, furosemide, metoprolol tartrate, omeprazole, sitaGLIPtin, and telmisartan.  Meds ordered this encounter  Medications  . DISCONTD: ALPRAZolam (XANAX) 0.5 MG tablet    Sig: Take 1 tablet (0.5 mg total) by mouth 2 (two) times daily as needed. for anxiety    Dispense:  60 tablet    Refill:  5  . mometasone (NASONEX) 50 MCG/ACT nasal spray    Sig: Place 2 sprays into the nose daily.  Dispense:  17 g    Refill:  12  . ALPRAZolam (XANAX) 0.5 MG tablet    Sig: Take 1 tablet (0.5 mg total) by mouth 2 (two) times daily as needed. for anxiety    Dispense:  60 tablet    Refill:  5  . ezetimibe (ZETIA) 10 MG tablet    Sig: Take 1 tablet (10 mg total) by mouth daily.    Dispense:  90 tablet    Refill:  1  . furosemide (LASIX) 20 MG tablet    Sig: TAKE ONE TABLET BY MOUTH ONCE DAILY AS NEEDED    Dispense:  90 tablet    Refill:  1  . gabapentin (NEURONTIN) 300 MG capsule    Sig: Take 1 capsule (300 mg total) by mouth 3 (three) times daily.    Dispense:  90 capsule    Refill:  1  . meloxicam (MOBIC) 15 MG tablet    Sig: Take 1 tablet (15 mg total) by mouth daily.    Dispense:  30 tablet    Refill:  1  . metoprolol tartrate (LOPRESSOR) 25 MG tablet    Sig: Take 0.5 tablets (12.5 mg total) by mouth 2 (two) times daily.    Dispense:  90 tablet    Refill:  1  . insulin NPH-regular Human (NOVOLIN 70/30 RELION) (70-30) 100 UNIT/ML injection    Sig: Inject 70 Units into the skin 2 (two) times daily with a meal.    Dispense:  50 vial    Refill:  5  . omeprazole (PRILOSEC) 40 MG capsule    Sig: TAKE ONE CAPSULE BY MOUTH TWICE DAILY HALF AN HOUR BEFORE BREAKFAST AND SUPPER    Dispense:  60 capsule     Refill:  5  . sitaGLIPtin (JANUVIA) 100 MG tablet    Sig: Take 1 tablet (100 mg total) by mouth daily.    Dispense:  90 tablet    Refill:  1  . telmisartan (MICARDIS) 40 MG tablet    Sig: TAKE ONE TABLET BY MOUTH ONCE DAILY (REPLACING  LISINOPRIL)    Dispense:  30 tablet    Refill:  5     Follow-up: Return in about 3 months (around 09/11/2015) for diabetes.  Claretta Fraise, M.D.

## 2015-06-14 DIAGNOSIS — M47812 Spondylosis without myelopathy or radiculopathy, cervical region: Secondary | ICD-10-CM | POA: Diagnosis not present

## 2015-06-14 DIAGNOSIS — Z8673 Personal history of transient ischemic attack (TIA), and cerebral infarction without residual deficits: Secondary | ICD-10-CM | POA: Diagnosis not present

## 2015-06-14 DIAGNOSIS — M5020 Other cervical disc displacement, unspecified cervical region: Secondary | ICD-10-CM | POA: Diagnosis not present

## 2015-06-14 DIAGNOSIS — M5116 Intervertebral disc disorders with radiculopathy, lumbar region: Secondary | ICD-10-CM | POA: Diagnosis not present

## 2015-06-14 LAB — CMP14+EGFR
ALT: 23 IU/L (ref 0–44)
AST: 20 IU/L (ref 0–40)
Albumin/Globulin Ratio: 1.8 (ref 1.1–2.5)
Albumin: 4.3 g/dL (ref 3.6–4.8)
Alkaline Phosphatase: 70 IU/L (ref 39–117)
BUN/Creatinine Ratio: 23 — ABNORMAL HIGH (ref 10–22)
BUN: 26 mg/dL (ref 8–27)
Bilirubin Total: 0.4 mg/dL (ref 0.0–1.2)
CO2: 23 mmol/L (ref 18–29)
Calcium: 9.6 mg/dL (ref 8.6–10.2)
Chloride: 96 mmol/L (ref 96–106)
Creatinine, Ser: 1.13 mg/dL (ref 0.76–1.27)
GFR calc Af Amer: 79 mL/min/{1.73_m2} (ref >59)
GFR calc non Af Amer: 68 mL/min/{1.73_m2} (ref >59)
Globulin, Total: 2.4 g/dL (ref 1.5–4.5)
Glucose: 163 mg/dL — ABNORMAL HIGH (ref 65–99)
Potassium: 4.6 mmol/L (ref 3.5–5.2)
Sodium: 138 mmol/L (ref 134–144)
Total Protein: 6.7 g/dL (ref 6.0–8.5)

## 2015-06-14 LAB — LIPID PANEL
Chol/HDL Ratio: 9.9 ratio units — ABNORMAL HIGH (ref 0.0–5.0)
Cholesterol, Total: 238 mg/dL — ABNORMAL HIGH (ref 100–199)
HDL: 24 mg/dL — ABNORMAL LOW (ref >39)
LDL Calculated: 167 mg/dL — ABNORMAL HIGH (ref 0–99)
Triglycerides: 234 mg/dL — ABNORMAL HIGH (ref 0–149)
VLDL Cholesterol Cal: 47 mg/dL — ABNORMAL HIGH (ref 5–40)

## 2015-06-14 LAB — MICROALBUMIN / CREATININE URINE RATIO
Creatinine, Urine: 105.1 mg/dL
MICROALB/CREAT RATIO: 19.1 mg/g creat (ref 0.0–30.0)
Microalbumin, Urine: 20.1 ug/mL

## 2015-06-20 DIAGNOSIS — M9901 Segmental and somatic dysfunction of cervical region: Secondary | ICD-10-CM | POA: Diagnosis not present

## 2015-06-20 DIAGNOSIS — M546 Pain in thoracic spine: Secondary | ICD-10-CM | POA: Diagnosis not present

## 2015-06-20 DIAGNOSIS — M47812 Spondylosis without myelopathy or radiculopathy, cervical region: Secondary | ICD-10-CM | POA: Diagnosis not present

## 2015-06-20 DIAGNOSIS — M9903 Segmental and somatic dysfunction of lumbar region: Secondary | ICD-10-CM | POA: Diagnosis not present

## 2015-06-20 DIAGNOSIS — M9902 Segmental and somatic dysfunction of thoracic region: Secondary | ICD-10-CM | POA: Diagnosis not present

## 2015-06-24 DIAGNOSIS — M546 Pain in thoracic spine: Secondary | ICD-10-CM | POA: Diagnosis not present

## 2015-06-24 DIAGNOSIS — M9901 Segmental and somatic dysfunction of cervical region: Secondary | ICD-10-CM | POA: Diagnosis not present

## 2015-06-24 DIAGNOSIS — M9903 Segmental and somatic dysfunction of lumbar region: Secondary | ICD-10-CM | POA: Diagnosis not present

## 2015-06-24 DIAGNOSIS — M47812 Spondylosis without myelopathy or radiculopathy, cervical region: Secondary | ICD-10-CM | POA: Diagnosis not present

## 2015-06-24 DIAGNOSIS — M9902 Segmental and somatic dysfunction of thoracic region: Secondary | ICD-10-CM | POA: Diagnosis not present

## 2015-06-25 DIAGNOSIS — M546 Pain in thoracic spine: Secondary | ICD-10-CM | POA: Diagnosis not present

## 2015-06-25 DIAGNOSIS — M9903 Segmental and somatic dysfunction of lumbar region: Secondary | ICD-10-CM | POA: Diagnosis not present

## 2015-06-25 DIAGNOSIS — M47812 Spondylosis without myelopathy or radiculopathy, cervical region: Secondary | ICD-10-CM | POA: Diagnosis not present

## 2015-06-25 DIAGNOSIS — M9901 Segmental and somatic dysfunction of cervical region: Secondary | ICD-10-CM | POA: Diagnosis not present

## 2015-06-25 DIAGNOSIS — M9902 Segmental and somatic dysfunction of thoracic region: Secondary | ICD-10-CM | POA: Diagnosis not present

## 2015-06-27 DIAGNOSIS — M546 Pain in thoracic spine: Secondary | ICD-10-CM | POA: Diagnosis not present

## 2015-06-27 DIAGNOSIS — M9903 Segmental and somatic dysfunction of lumbar region: Secondary | ICD-10-CM | POA: Diagnosis not present

## 2015-06-27 DIAGNOSIS — M9901 Segmental and somatic dysfunction of cervical region: Secondary | ICD-10-CM | POA: Diagnosis not present

## 2015-06-27 DIAGNOSIS — M9902 Segmental and somatic dysfunction of thoracic region: Secondary | ICD-10-CM | POA: Diagnosis not present

## 2015-06-27 DIAGNOSIS — M47812 Spondylosis without myelopathy or radiculopathy, cervical region: Secondary | ICD-10-CM | POA: Diagnosis not present

## 2015-07-02 DIAGNOSIS — Z981 Arthrodesis status: Secondary | ICD-10-CM | POA: Diagnosis not present

## 2015-07-02 DIAGNOSIS — Z888 Allergy status to other drugs, medicaments and biological substances status: Secondary | ICD-10-CM | POA: Diagnosis not present

## 2015-07-02 DIAGNOSIS — Z79899 Other long term (current) drug therapy: Secondary | ICD-10-CM | POA: Diagnosis not present

## 2015-07-02 DIAGNOSIS — Z794 Long term (current) use of insulin: Secondary | ICD-10-CM | POA: Diagnosis not present

## 2015-07-02 DIAGNOSIS — I10 Essential (primary) hypertension: Secondary | ICD-10-CM | POA: Diagnosis not present

## 2015-07-02 DIAGNOSIS — Z887 Allergy status to serum and vaccine status: Secondary | ICD-10-CM | POA: Diagnosis not present

## 2015-07-02 DIAGNOSIS — Z8673 Personal history of transient ischemic attack (TIA), and cerebral infarction without residual deficits: Secondary | ICD-10-CM | POA: Diagnosis not present

## 2015-07-02 DIAGNOSIS — E119 Type 2 diabetes mellitus without complications: Secondary | ICD-10-CM | POA: Diagnosis not present

## 2015-07-02 DIAGNOSIS — Z7982 Long term (current) use of aspirin: Secondary | ICD-10-CM | POA: Diagnosis not present

## 2015-07-02 DIAGNOSIS — M47812 Spondylosis without myelopathy or radiculopathy, cervical region: Secondary | ICD-10-CM | POA: Diagnosis not present

## 2015-07-02 DIAGNOSIS — K219 Gastro-esophageal reflux disease without esophagitis: Secondary | ICD-10-CM | POA: Diagnosis not present

## 2015-07-02 DIAGNOSIS — Z9861 Coronary angioplasty status: Secondary | ICD-10-CM | POA: Diagnosis not present

## 2015-07-02 DIAGNOSIS — M5136 Other intervertebral disc degeneration, lumbar region: Secondary | ICD-10-CM | POA: Diagnosis not present

## 2015-07-02 DIAGNOSIS — M5020 Other cervical disc displacement, unspecified cervical region: Secondary | ICD-10-CM | POA: Diagnosis not present

## 2015-07-02 DIAGNOSIS — M4316 Spondylolisthesis, lumbar region: Secondary | ICD-10-CM | POA: Diagnosis not present

## 2015-07-02 DIAGNOSIS — M5116 Intervertebral disc disorders with radiculopathy, lumbar region: Secondary | ICD-10-CM | POA: Diagnosis not present

## 2015-07-02 DIAGNOSIS — M47816 Spondylosis without myelopathy or radiculopathy, lumbar region: Secondary | ICD-10-CM | POA: Diagnosis not present

## 2015-07-02 DIAGNOSIS — E785 Hyperlipidemia, unspecified: Secondary | ICD-10-CM | POA: Diagnosis not present

## 2015-07-02 DIAGNOSIS — I2510 Atherosclerotic heart disease of native coronary artery without angina pectoris: Secondary | ICD-10-CM | POA: Diagnosis not present

## 2015-07-04 DIAGNOSIS — M9902 Segmental and somatic dysfunction of thoracic region: Secondary | ICD-10-CM | POA: Diagnosis not present

## 2015-07-04 DIAGNOSIS — M9903 Segmental and somatic dysfunction of lumbar region: Secondary | ICD-10-CM | POA: Diagnosis not present

## 2015-07-04 DIAGNOSIS — M47812 Spondylosis without myelopathy or radiculopathy, cervical region: Secondary | ICD-10-CM | POA: Diagnosis not present

## 2015-07-04 DIAGNOSIS — M546 Pain in thoracic spine: Secondary | ICD-10-CM | POA: Diagnosis not present

## 2015-07-04 DIAGNOSIS — M9901 Segmental and somatic dysfunction of cervical region: Secondary | ICD-10-CM | POA: Diagnosis not present

## 2015-07-18 DIAGNOSIS — M9901 Segmental and somatic dysfunction of cervical region: Secondary | ICD-10-CM | POA: Diagnosis not present

## 2015-07-18 DIAGNOSIS — M9903 Segmental and somatic dysfunction of lumbar region: Secondary | ICD-10-CM | POA: Diagnosis not present

## 2015-07-18 DIAGNOSIS — M9902 Segmental and somatic dysfunction of thoracic region: Secondary | ICD-10-CM | POA: Diagnosis not present

## 2015-07-18 DIAGNOSIS — M546 Pain in thoracic spine: Secondary | ICD-10-CM | POA: Diagnosis not present

## 2015-07-18 DIAGNOSIS — M47812 Spondylosis without myelopathy or radiculopathy, cervical region: Secondary | ICD-10-CM | POA: Diagnosis not present

## 2015-07-29 ENCOUNTER — Telehealth: Payer: Self-pay | Admitting: Pharmacist

## 2015-07-29 NOTE — Telephone Encounter (Signed)
Patient states that he had a low BG Wednesday, March 8th.  He has missed lunch and supper that day.  He missed 2 doses of Humulin 70/30 last night and the night before.  This am and previous morning BG were less than 130.  He wants to know if he can just hold evening dose.  Patient has changed his diet quite a bit.  Ok to hold but if he gets BG over 130 in the morning should restart at 10 units and call office.

## 2015-07-31 DIAGNOSIS — M47812 Spondylosis without myelopathy or radiculopathy, cervical region: Secondary | ICD-10-CM | POA: Diagnosis not present

## 2015-07-31 DIAGNOSIS — M5116 Intervertebral disc disorders with radiculopathy, lumbar region: Secondary | ICD-10-CM | POA: Diagnosis not present

## 2015-07-31 DIAGNOSIS — M5020 Other cervical disc displacement, unspecified cervical region: Secondary | ICD-10-CM | POA: Diagnosis not present

## 2015-07-31 DIAGNOSIS — M4316 Spondylolisthesis, lumbar region: Secondary | ICD-10-CM | POA: Diagnosis not present

## 2015-08-13 ENCOUNTER — Other Ambulatory Visit: Payer: Self-pay | Admitting: Family Medicine

## 2015-08-13 DIAGNOSIS — M9901 Segmental and somatic dysfunction of cervical region: Secondary | ICD-10-CM | POA: Diagnosis not present

## 2015-08-13 DIAGNOSIS — M546 Pain in thoracic spine: Secondary | ICD-10-CM | POA: Diagnosis not present

## 2015-08-13 DIAGNOSIS — M9902 Segmental and somatic dysfunction of thoracic region: Secondary | ICD-10-CM | POA: Diagnosis not present

## 2015-08-13 DIAGNOSIS — M9903 Segmental and somatic dysfunction of lumbar region: Secondary | ICD-10-CM | POA: Diagnosis not present

## 2015-08-13 DIAGNOSIS — M47812 Spondylosis without myelopathy or radiculopathy, cervical region: Secondary | ICD-10-CM | POA: Diagnosis not present

## 2015-08-27 ENCOUNTER — Other Ambulatory Visit: Payer: Self-pay | Admitting: Family Medicine

## 2015-09-17 ENCOUNTER — Encounter (INDEPENDENT_AMBULATORY_CARE_PROVIDER_SITE_OTHER): Payer: Self-pay

## 2015-09-17 ENCOUNTER — Encounter: Payer: Self-pay | Admitting: Family Medicine

## 2015-09-17 ENCOUNTER — Ambulatory Visit (INDEPENDENT_AMBULATORY_CARE_PROVIDER_SITE_OTHER): Payer: Medicare Other | Admitting: Family Medicine

## 2015-09-17 VITALS — BP 128/60 | HR 40 | Temp 96.8°F | Ht 74.0 in | Wt 278.0 lb

## 2015-09-17 DIAGNOSIS — E785 Hyperlipidemia, unspecified: Secondary | ICD-10-CM | POA: Diagnosis not present

## 2015-09-17 DIAGNOSIS — K219 Gastro-esophageal reflux disease without esophagitis: Secondary | ICD-10-CM | POA: Diagnosis not present

## 2015-09-17 DIAGNOSIS — I10 Essential (primary) hypertension: Secondary | ICD-10-CM

## 2015-09-17 DIAGNOSIS — Z23 Encounter for immunization: Secondary | ICD-10-CM

## 2015-09-17 DIAGNOSIS — E1169 Type 2 diabetes mellitus with other specified complication: Secondary | ICD-10-CM

## 2015-09-17 DIAGNOSIS — E291 Testicular hypofunction: Secondary | ICD-10-CM | POA: Diagnosis not present

## 2015-09-17 DIAGNOSIS — R7989 Other specified abnormal findings of blood chemistry: Secondary | ICD-10-CM

## 2015-09-17 DIAGNOSIS — E1142 Type 2 diabetes mellitus with diabetic polyneuropathy: Secondary | ICD-10-CM | POA: Diagnosis not present

## 2015-09-17 LAB — URINALYSIS
Bilirubin, UA: NEGATIVE
Glucose, UA: NEGATIVE
Ketones, UA: NEGATIVE
Leukocytes, UA: NEGATIVE
Nitrite, UA: NEGATIVE
Protein, UA: NEGATIVE
RBC, UA: NEGATIVE
Specific Gravity, UA: 1.02 (ref 1.005–1.030)
Urobilinogen, Ur: 0.2 mg/dL (ref 0.2–1.0)
pH, UA: 5 (ref 5.0–7.5)

## 2015-09-17 LAB — BAYER DCA HB A1C WAIVED: HB A1C (BAYER DCA - WAIVED): 6.5 % (ref <7.0)

## 2015-09-17 MED ORDER — GABAPENTIN 300 MG PO CAPS: 300.0000 mg | ORAL_CAPSULE | Freq: Three times a day (TID) | ORAL | Status: DC

## 2015-09-17 MED ORDER — FUROSEMIDE 20 MG PO TABS: ORAL_TABLET | ORAL | Status: DC

## 2015-09-17 MED ORDER — MELOXICAM 15 MG PO TABS: 15.0000 mg | ORAL_TABLET | Freq: Every day | ORAL | Status: DC

## 2015-09-17 MED ORDER — TELMISARTAN 40 MG PO TABS: ORAL_TABLET | ORAL | Status: DC

## 2015-09-17 MED ORDER — OMEPRAZOLE 40 MG PO CPDR: DELAYED_RELEASE_CAPSULE | ORAL | Status: DC

## 2015-09-17 MED ORDER — METOPROLOL TARTRATE 25 MG PO TABS: 12.5000 mg | ORAL_TABLET | Freq: Two times a day (BID) | ORAL | Status: DC

## 2015-09-17 MED ORDER — INSULIN NPH ISOPHANE & REGULAR (70-30) 100 UNIT/ML ~~LOC~~ SUSP: 70.0000 [IU] | Freq: Two times a day (BID) | SUBCUTANEOUS | Status: DC

## 2015-09-17 MED ORDER — SITAGLIPTIN PHOSPHATE 100 MG PO TABS: 100.0000 mg | ORAL_TABLET | Freq: Every day | ORAL | Status: DC

## 2015-09-17 NOTE — Progress Notes (Signed)
Subjective:  Patient ID: [name] [name], male    DOB: 04-28-1951  Age: 65 y.o. MRN: [id number]  CC: Diabetes; Hypertension; Hyperlipidemia; and GAD   HPI [name] [name] presents for  follow-up of hypertension. Patient has no history of headache chest pain or shortness of breath or recent cough. Patient also denies symptoms of TIA such as numbness weakness lateralizing. Patient checks  blood pressure at home and has not had any elevated readings recently. Patient denies side effects from his medication. States taking it regularly.  Patient also  in for follow-up of elevated cholesterol. Off med due to side effects of statin including myalgia and arthralgia and nausea. Also in today for liver function testing. Currently no chest pain, shortness of breath or other cardiovascular related symptoms noted.  Follow-up of diabetes. Patient does check blood sugar at home. Readings run between 110 and 130 Patient denies symptoms such as polyuria, polydipsia, excessive hunger, nausea No significant hypoglycemic spells noted. Neuro pain well controlled Medications as noted below. Taking them regularly without complication/adverse reaction being reported today.    History [name] [name] has a past medical history of Diabetes mellitus; Hypertension; GERD (gastroesophageal reflux disease); Arthritis; CAD (coronary artery disease); AODM; Disorders of iron metabolism; MURMUR; CHEST PAIN-UNSPECIFIED; DISORDERS OF IRON METABOLISM; Medically noncompliant; Cellulitis and abscess rt groin; Hyperlipidemia; and Low serum testosterone level.   He has past surgical history that includes Coronary stent placement (2000); Hernia repair (2000); Back surgery (2015); Lesion removal; Esophagogastroduodenoscopy; Colonoscopy (N/A, 10/01/2014); and Esophagogastroduodenoscopy (N/A, 10/01/2014).   His family history includes Alzheimer's disease in his mother; Arthritis in his father, mother, and sister; Cancer in his mother and paternal uncle;  Dementia in his maternal aunt and maternal uncle; Diabetes in his father and sister; Heart disease in his father and maternal uncle; Hyperlipidemia in his mother and sister; Hypertension in his mother and sister; Valvular heart disease in his father. There is no history of Colon cancer or Liver disease.He reports that he has been smoking Cigarettes.  He has a 12.75 pack-year smoking history. He has never used smokeless tobacco. He reports that he does not drink alcohol or use illicit drugs.  Current Outpatient Prescriptions on File Prior to Visit  Medication Sig Dispense Refill  . ALPRAZolam (XANAX) 0.5 MG tablet Take 1 tablet (0.5 mg total) by mouth 2 (two) times daily as needed. for anxiety 60 tablet 5  . aspirin EC 81 MG tablet Take 81 mg by mouth daily.    . furosemide (LASIX) 20 MG tablet TAKE ONE TABLET BY MOUTH ONCE DAILY AS NEEDED 90 tablet 1  . gabapentin (NEURONTIN) 300 MG capsule TAKE ONE CAPSULE BY MOUTH THREE TIMES DAILY 90 capsule 1  . insulin NPH-regular Human (NOVOLIN 70/30 RELION) (70-30) 100 UNIT/ML injection Inject 70 Units into the skin 2 (two) times daily with a meal. 50 vial 5  . Insulin Pen Needle (PEN NEEDLES) 31G X 6 MM MISC Use to inject insulin two to three times a day 100 each 3  . Lactobacillus (PROBIOTIC ACIDOPHILUS PO) Take 1 capsule by mouth daily.    . meloxicam (MOBIC) 15 MG tablet TAKE ONE TABLET BY MOUTH ONCE DAILY 30 tablet 0  . metoprolol tartrate (LOPRESSOR) 25 MG tablet Take 0.5 tablets (12.5 mg total) by mouth 2 (two) times daily. 90 tablet 1  . mometasone (NASONEX) 50 MCG/ACT nasal spray Place 2 sprays into the nose daily. 17 g 12  . omeprazole (PRILOSEC) 40 MG capsule TAKE ONE CAPSULE BY MOUTH TWICE  DAILY HALF AN HOUR BEFORE BREAKFAST AND SUPPER 60 capsule 5  . Red Yeast Rice Extract (RED YEAST RICE PO) Take 2 tablets by mouth daily.     . sitaGLIPtin (JANUVIA) 100 MG tablet Take 1 tablet (100 mg total) by mouth daily. 90 tablet 1  . telmisartan (MICARDIS)  40 MG tablet TAKE ONE TABLET BY MOUTH ONCE DAILY (REPLACING  LISINOPRIL) 30 tablet 5  . Omega-3 Fatty Acids (FISH OIL) 1000 MG CAPS Take 2 capsules by mouth 2 (two) times daily. Reported on 09/17/2015     No current facility-administered medications on file prior to visit.    ROS Review of Systems  Constitutional: Negative for fever, chills, diaphoresis and unexpected weight change.  HENT: Negative for congestion, hearing loss, rhinorrhea and sore throat.   Eyes: Negative for visual disturbance.  Respiratory: Negative for cough and shortness of breath.   Cardiovascular: Negative for chest pain.  Gastrointestinal: Negative for abdominal pain, diarrhea and constipation.  Genitourinary: Negative for dysuria and flank pain.  Musculoskeletal: Negative for joint swelling and arthralgias.  Skin: Negative for rash.  Neurological: Negative for dizziness and headaches.  Psychiatric/Behavioral: Negative for sleep disturbance and dysphoric mood.    Objective:  BP 128/60 mmHg  Pulse 40  Temp(Src) 96.8 F (36 C) (Oral)  Ht '6\' 2"'$  (1.88 m)  Wt 278 lb (126.1 kg)  BMI 35.68 kg/m2  SpO2 98%  BP Readings from Last 3 Encounters:  09/17/15 128/60  06/13/15 149/79  02/11/15 170/82    Wt Readings from Last 3 Encounters:  09/17/15 278 lb (126.1 kg)  06/13/15 280 lb 6.4 oz (127.189 kg)  02/11/15 276 lb 6.4 oz (125.374 kg)     Physical Exam  Constitutional: He is oriented to person, place, and time. He appears well-developed and well-nourished. No distress.  HENT:  Head: Normocephalic and atraumatic.  Right Ear: External ear normal.  Left Ear: External ear normal.  Nose: Nose normal.  Mouth/Throat: Oropharynx is clear and moist.  Eyes: Conjunctivae and EOM are normal. Pupils are equal, round, and reactive to light.  Neck: Normal range of motion. Neck supple. No thyromegaly present.  Cardiovascular: Normal rate, regular rhythm and normal heart sounds.   No murmur heard. Pulmonary/Chest:  Effort normal and breath sounds normal. No respiratory distress. He has no wheezes. He has no rales.  Abdominal: Soft. Bowel sounds are normal. He exhibits no distension. There is no tenderness.  Lymphadenopathy:    He has no cervical adenopathy.  Neurological: He is alert and oriented to person, place, and time. He has normal reflexes.  Skin: Skin is warm and dry.  Psychiatric: He has a normal mood and affect. His behavior is normal. Judgment and thought content normal.    Lab Results  Component Value Date   HGBA1C 7.4 06/13/2015   HGBA1C 10.0 02/11/2015   HGBA1C 8.1 11/08/2014    Lab Results  Component Value Date   WBC 10.7 02/11/2015   HGB 15.9 08/13/2014   HCT 44.5 02/11/2015   PLT 261 02/11/2015   GLUCOSE 163* 06/13/2015   CHOL 238* 06/13/2015   TRIG 234* 06/13/2015   HDL 24* 06/13/2015   LDLDIRECT 172* 11/08/2014   LDLCALC 167* 06/13/2015   ALT 23 06/13/2015   AST 20 06/13/2015   NA 138 06/13/2015   K 4.6 06/13/2015   CL 96 06/13/2015   CREATININE 1.13 06/13/2015   BUN 26 06/13/2015   CO2 23 06/13/2015   TSH 2.170 01/03/2015   PSA 1.1 05/31/2013  INR 0.99 10/25/2011   HGBA1C 7.4 06/13/2015    Mr Lumbar Spine Wo Contrast  12/27/2014  CLINICAL DATA:  Lumbar spondylosis.  Left leg pain and numbness EXAM: MRI LUMBAR SPINE WITHOUT CONTRAST TECHNIQUE: Multiplanar, multisequence MR imaging of the lumbar spine was performed. No intravenous contrast was administered. COMPARISON:  Lumbar MRI 12/16/2010 FINDINGS: Grade 1 anterior slip L4-5 is similar to the prior study. Negative for fracture or mass. Conus medullaris normal and terminates at L1-2 L1-2:  Mild facet degeneration without stenosis. L2-3: Small left paracentral disc protrusion has developed since the prior study. This appears to be causing impingement of the left L3 nerve root in the subarticular recess. There is bilateral facet hypertrophy and mild spinal stenosis at this level. L3-4: Mild retrolisthesis. Diffuse  disc bulging. Moderate facet hypertrophy. Moderate spinal stenosis similar to the prior study. Left foraminal encroachment again noted with impingement of the left L3 nerve root in the foramen due to disc bulging and facet hypertrophy. L4-5: 6 mm anterior slip. Advanced facet degeneration. Diffuse disc bulging. Mild foraminal narrowing bilaterally. L5-S1: Disc degeneration and facet degeneration. Moderate right foraminal encroachment. IMPRESSION: Interval development of small left paracentral disc protrusion at L2-3 with expected impingement of left L3 nerve root in the subarticular recess. There is severe left foraminal encroachment at L3-4 also causing L3 nerve root impingement. This is similar to the prior study Moderate spinal stenosis at L3-4 Grade 1 anterior slip L4-5 related to facet degeneration. There is mild foraminal narrowing bilaterally at L4-5. Electronically Signed   By: Franchot Gallo M.D.   On: 12/27/2014 20:43    Assessment & Plan:   Moshe was seen today for diabetes, hypertension, hyperlipidemia and gad.  Diagnoses and all orders for this visit:  Diabetic peripheral neuropathy associated with type 2 diabetes mellitus ([place]) -     Microalbumin / creatinine urine ratio -     Urinalysis -     Bayer DCA Hb A1c Waived -     Lipid panel -     CBC with Differential/Platelet -     CMP14+EGFR  Essential hypertension, benign  Gastroesophageal reflux disease without esophagitis  Low serum testosterone level  Hyperlipidemia associated with type 2 diabetes mellitus (HCC) -     Microalbumin / creatinine urine ratio -     Urinalysis -     Bayer DCA Hb A1c Waived -     Lipid panel -     CBC with Differential/Platelet -     CMP14+EGFR   I have discontinued [name] [name] linaclotide and ezetimibe. I am also having him maintain his aspirin EC, Lactobacillus (PROBIOTIC ACIDOPHILUS PO), Red Yeast Rice Extract (RED YEAST RICE PO), Fish Oil, Pen Needles, mometasone, ALPRAZolam, furosemide,  metoprolol tartrate, insulin NPH-regular Human, omeprazole, sitaGLIPtin, telmisartan, gabapentin, and meloxicam.  No orders of the defined types were placed in this encounter.     Follow-up: Return in about 3 months (around 12/18/2015) for CPE.  Claretta Fraise, M.D.

## 2015-09-17 NOTE — Addendum Note (Signed)
Addended by: Marin Olp on: 09/17/2015 09:16 AM   Modules accepted: Orders, Medications

## 2015-09-18 ENCOUNTER — Telehealth: Payer: Self-pay | Admitting: Family Medicine

## 2015-09-18 LAB — CBC WITH DIFFERENTIAL/PLATELET
Basophils Absolute: 0.1 10*3/uL (ref 0.0–0.2)
Basos: 1 %
EOS (ABSOLUTE): 0.2 10*3/uL (ref 0.0–0.4)
Eos: 1 %
Hematocrit: 46.9 % (ref 37.5–51.0)
Hemoglobin: 15.8 g/dL (ref 12.6–17.7)
Immature Grans (Abs): 0.1 10*3/uL (ref 0.0–0.1)
Immature Granulocytes: 1 %
Lymphocytes Absolute: 2.4 10*3/uL (ref 0.7–3.1)
Lymphs: 19 %
MCH: 28.6 pg (ref 26.6–33.0)
MCHC: 33.7 g/dL (ref 31.5–35.7)
MCV: 85 fL (ref 79–97)
Monocytes Absolute: 1 10*3/uL — ABNORMAL HIGH (ref 0.1–0.9)
Monocytes: 8 %
Neutrophils Absolute: 8.8 10*3/uL — ABNORMAL HIGH (ref 1.4–7.0)
Neutrophils: 70 %
Platelets: 268 10*3/uL (ref 150–379)
RBC: 5.53 x10E6/uL (ref 4.14–5.80)
RDW: 14.3 % (ref 12.3–15.4)
WBC: 12.4 10*3/uL — ABNORMAL HIGH (ref 3.4–10.8)

## 2015-09-18 LAB — MICROALBUMIN / CREATININE URINE RATIO
Creatinine, Urine: 83.2 mg/dL
MICROALB/CREAT RATIO: 44.8 mg/g creat — ABNORMAL HIGH (ref 0.0–30.0)
Microalbumin, Urine: 37.3 ug/mL

## 2015-09-18 LAB — LIPID PANEL
Chol/HDL Ratio: 8.8 ratio units — ABNORMAL HIGH (ref 0.0–5.0)
Cholesterol, Total: 210 mg/dL — ABNORMAL HIGH (ref 100–199)
HDL: 24 mg/dL — ABNORMAL LOW (ref >39)
LDL Calculated: 134 mg/dL — ABNORMAL HIGH (ref 0–99)
Triglycerides: 260 mg/dL — ABNORMAL HIGH (ref 0–149)
VLDL Cholesterol Cal: 52 mg/dL — ABNORMAL HIGH (ref 5–40)

## 2015-09-18 LAB — CMP14+EGFR
ALT: 17 IU/L (ref 0–44)
AST: 17 IU/L (ref 0–40)
Albumin/Globulin Ratio: 1.7 (ref 1.2–2.2)
Albumin: 4.3 g/dL (ref 3.6–4.8)
Alkaline Phosphatase: 67 IU/L (ref 39–117)
BUN/Creatinine Ratio: 24 (ref 10–24)
BUN: 25 mg/dL (ref 8–27)
Bilirubin Total: 0.4 mg/dL (ref 0.0–1.2)
CO2: 27 mmol/L (ref 18–29)
Calcium: 9.9 mg/dL (ref 8.6–10.2)
Chloride: 98 mmol/L (ref 96–106)
Creatinine, Ser: 1.06 mg/dL (ref 0.76–1.27)
GFR calc Af Amer: 85 mL/min/{1.73_m2} (ref >59)
GFR calc non Af Amer: 74 mL/min/{1.73_m2} (ref >59)
Globulin, Total: 2.5 g/dL (ref 1.5–4.5)
Glucose: 127 mg/dL — ABNORMAL HIGH (ref 65–99)
Potassium: 4.9 mmol/L (ref 3.5–5.2)
Sodium: 141 mmol/L (ref 134–144)
Total Protein: 6.8 g/dL (ref 6.0–8.5)

## 2015-09-20 ENCOUNTER — Other Ambulatory Visit: Payer: Self-pay | Admitting: Family Medicine

## 2015-09-20 MED ORDER — FENOFIBRATE 160 MG PO TABS: 160.0000 mg | ORAL_TABLET | Freq: Every day | ORAL | Status: DC

## 2015-09-23 NOTE — Telephone Encounter (Signed)
-----   Message from Claretta Fraise, MD sent at 09/20/2015  5:23 PM EDT ----- Your cholesterol is too high. It would benefit from medication. Without treatment, risk for heart attack, stroke and other medical conditions is high. I recommendfenofibrate, a cholesterol medication that is not related to the statins.

## 2015-09-23 NOTE — Telephone Encounter (Signed)
Patient aware of lab results and fenofibrate has been sent to his pharmacy.

## 2015-10-08 ENCOUNTER — Telehealth: Payer: Self-pay

## 2015-10-08 MED ORDER — INSULIN NPH ISOPHANE & REGULAR (70-30) 100 UNIT/ML ~~LOC~~ SUSP: 70.0000 [IU] | Freq: Two times a day (BID) | SUBCUTANEOUS | Status: DC

## 2015-10-08 NOTE — Telephone Encounter (Signed)
Insurance denied prior authorization for Novolin 70/30   Have to try Humalin 70/30

## 2015-11-01 ENCOUNTER — Ambulatory Visit (INDEPENDENT_AMBULATORY_CARE_PROVIDER_SITE_OTHER): Payer: Medicare Other | Admitting: Family

## 2015-11-01 ENCOUNTER — Encounter: Payer: Self-pay | Admitting: Family

## 2015-11-01 VITALS — BP 139/76 | HR 63 | Temp 98.1°F | Ht 74.0 in | Wt 275.2 lb

## 2015-11-01 DIAGNOSIS — F329 Major depressive disorder, single episode, unspecified: Secondary | ICD-10-CM | POA: Diagnosis not present

## 2015-11-01 DIAGNOSIS — J0100 Acute maxillary sinusitis, unspecified: Secondary | ICD-10-CM

## 2015-11-01 DIAGNOSIS — F32A Depression, unspecified: Secondary | ICD-10-CM | POA: Insufficient documentation

## 2015-11-01 MED ORDER — AMOXICILLIN-POT CLAVULANATE 875-125 MG PO TABS: 1.0000 | ORAL_TABLET | Freq: Two times a day (BID) | ORAL | Status: DC

## 2015-11-01 MED ORDER — ESCITALOPRAM OXALATE 10 MG PO TABS: 10.0000 mg | ORAL_TABLET | Freq: Every day | ORAL | Status: DC

## 2015-11-01 NOTE — Patient Instructions (Addendum)
Sinusitis, Adult Sinusitis is redness, soreness, and inflammation of the paranasal sinuses. Paranasal sinuses are air pockets within the bones of your face. They are located beneath your eyes, in the middle of your forehead, and above your eyes. In healthy paranasal sinuses, mucus is able to drain out, and air is able to circulate through them by way of your nose. However, when your paranasal sinuses are inflamed, mucus and air can become trapped. This can allow bacteria and other germs to grow and cause infection. Sinusitis can develop quickly and last only a short time (acute) or continue over a long period (chronic). Sinusitis that lasts for more than 12 weeks is considered chronic. CAUSES Causes of sinusitis include:  Allergies.  Structural abnormalities, such as displacement of the cartilage that separates your nostrils (deviated septum), which can decrease the air flow through your nose and sinuses and affect sinus drainage.  Functional abnormalities, such as when the small hairs (cilia) that line your sinuses and help remove mucus do not work properly or are not present. SIGNS AND SYMPTOMS Symptoms of acute and chronic sinusitis are the same. The primary symptoms are pain and pressure around the affected sinuses. Other symptoms include:  Upper toothache.  Earache.  Headache.  Bad breath.  Decreased sense of smell and taste.  A cough, which worsens when you are lying flat.  Fatigue.  Fever.  Thick drainage from your nose, which often is green and may contain pus (purulent).  Swelling and warmth over the affected sinuses. DIAGNOSIS Your health care provider will perform a physical exam. During your exam, your health care provider may perform any of the following to help determine if you have acute sinusitis or chronic sinusitis:  Look in your nose for signs of abnormal growths in your nostrils (nasal polyps).  Tap over the affected sinus to check for signs of  infection.  View the inside of your sinuses using an imaging device that has a light attached (endoscope). If your health care provider suspects that you have chronic sinusitis, one or more of the following tests may be recommended:  Allergy tests.  Nasal culture. A sample of mucus is taken from your nose, sent to a lab, and screened for bacteria.  Nasal cytology. A sample of mucus is taken from your nose and examined by your health care provider to determine if your sinusitis is related to an allergy. TREATMENT Most cases of acute sinusitis are related to a viral infection and will resolve on their own within 10 days. Sometimes, medicines are prescribed to help relieve symptoms of both acute and chronic sinusitis. These may include pain medicines, decongestants, nasal steroid sprays, or saline sprays. However, for sinusitis related to a bacterial infection, your health care provider will prescribe antibiotic medicines. These are medicines that will help kill the bacteria causing the infection. Rarely, sinusitis is caused by a fungal infection. In these cases, your health care provider will prescribe antifungal medicine. For some cases of chronic sinusitis, surgery is needed. Generally, these are cases in which sinusitis recurs more than 3 times per year, despite other treatments. HOME CARE INSTRUCTIONS  Drink plenty of water. Water helps thin the mucus so your sinuses can drain more easily.  Use a humidifier.  Inhale steam 3-4 times a day (for example, sit in the bathroom with the shower running).  Apply a warm, moist washcloth to your face 3-4 times a day, or as directed by your health care provider.  Use saline nasal sprays to help   moisten and clean your sinuses.  Take medicines only as directed by your health care provider.  If you were prescribed either an antibiotic or antifungal medicine, finish it all even if you start to feel better. SEEK IMMEDIATE MEDICAL CARE IF:  You have  increasing pain or severe headaches.  You have nausea, vomiting, or drowsiness.  You have swelling around your face.  You have vision problems.  You have a stiff neck.  You have difficulty breathing.   This information is not intended to replace advice given to you by your health care provider. Make sure you discuss any questions you have with your health care provider.   Document Released: 05/04/2005 Document Revised: 05/25/2014 Document Reviewed: 05/19/2011 Elsevier Interactive Patient Education 2016 [employer]  - Take meds as prescribed - Use a cool mist humidifier  -Use saline nose sprays frequently -Saline irrigations of the nose can be very helpful if done frequently.  * 4X daily for 1 week*  * Use of a nettie pot can be helpful with this. Follow directions with this* -Force fluids -For any cough or congestion  Use plain Mucinex- regular strength or max strength is fine   * Children- consult with Pharmacist for dosing -For fever or aces or pains- take tylenol or ibuprofen appropriate for age and weight.  * for fevers greater than 101 orally you may alternate ibuprofen and tylenol every  3 hours. -Throat lozenges if help   Rebekah Sprinkle, FNP   

## 2015-11-01 NOTE — Progress Notes (Signed)
Subjective:    Patient ID: [name] [name], male    DOB: 02-Sep-1950, 65 y.o.   MRN: [id number]  Headache  Associated symptoms include coughing, ear pain and rhinorrhea. Pertinent negatives include no fever or sore throat.  Cough This is a chronic problem. The current episode started more than 1 month ago. The problem has been waxing and waning. The problem occurs every few minutes. The cough is productive of purulent sputum. Associated symptoms include ear congestion, ear pain, headaches, nasal congestion, postnasal drip, rhinorrhea, shortness of breath and wheezing. Pertinent negatives include no fever or sore throat. The symptoms are aggravated by lying down. He has tried rest and OTC cough suppressant for the symptoms. There is no history of asthma, COPD or environmental allergies.  Depression      The patient presents with depression.  This is a chronic problem.  The current episode started more than 1 year ago.   The onset quality is gradual.   The problem occurs intermittently.  The problem has been waxing and waning since onset.  Associated symptoms include hopelessness, irritable, restlessness, headaches and sad.  Associated symptoms include no helplessness, no body aches and no suicidal ideas.     The symptoms are aggravated by family issues.  Past treatments include nothing.  Compliance with treatment is good.  Past medical history includes depression.       Review of Systems  Constitutional: Negative.  Negative for fever.  HENT: Positive for ear pain, postnasal drip and rhinorrhea. Negative for sore throat.   Respiratory: Positive for cough, shortness of breath and wheezing.   Cardiovascular: Negative.   Gastrointestinal: Negative.   Endocrine: Negative.   Genitourinary: Negative.   Musculoskeletal: Negative.   Allergic/Immunologic: Negative for environmental allergies.  Neurological: Positive for headaches.  Hematological: Negative.   Psychiatric/Behavioral: Positive for  depression. Negative for suicidal ideas.  All other systems reviewed and are negative.      Objective:   Physical Exam  Constitutional: He is oriented to person, place, and time. He appears well-developed and well-nourished. He is irritable. No distress.  HENT:  Head: Normocephalic.  Right Ear: External ear normal. Tympanic membrane is bulging.  Left Ear: External ear normal. Tympanic membrane is bulging.  Nose: Mucosal edema and rhinorrhea present. Right sinus exhibits maxillary sinus tenderness and frontal sinus tenderness. Left sinus exhibits maxillary sinus tenderness and frontal sinus tenderness.  Mouth/Throat: Oropharynx is clear and moist.  Eyes: Pupils are equal, round, and reactive to light. Right eye exhibits no discharge. Left eye exhibits no discharge.  Neck: Normal range of motion. Neck supple. No thyromegaly present.  Cardiovascular: Normal rate, regular rhythm, normal heart sounds and intact distal pulses.   No murmur heard. Pulmonary/Chest: Effort normal and breath sounds normal. No respiratory distress. He has no wheezes.  Abdominal: Soft. Bowel sounds are normal. He exhibits no distension. There is no tenderness.  Musculoskeletal: Normal range of motion. He exhibits no edema or tenderness.  Neurological: He is alert and oriented to person, place, and time.  Skin: Skin is warm and dry. No rash noted. No erythema.  Psychiatric: He has a normal mood and affect. His behavior is normal. Judgment and thought content normal.  Vitals reviewed.   BP 170/82 mmHg  Pulse 64  Temp(Src) 98.1 F (36.7 C) (Oral)  Ht 6\' 2"  (1.88 m)  Wt 275 lb 3.2 oz (124.83 kg)  BMI 35.32 kg/m2       Assessment & Plan:  1. Acute maxillary sinusitis, recurrence  not specified -- Take meds as prescribed - Use a cool mist humidifier  -Use saline nose sprays frequently -Saline irrigations of the nose can be very helpful if done frequently.  * 4X daily for 1 week*  * Use of a nettie pot can be  helpful with this. Follow directions with this* -Force fluids -For any cough or congestion  Use plain Mucinex- regular strength or max strength is fine   * Children- consult with Pharmacist for dosing -For fever or aces or pains- take tylenol or ibuprofen appropriate for age and weight.  * for fevers greater than 101 orally you may alternate ibuprofen and tylenol every  3 hours. -Throat lozenges if help - amoxicillin-clavulanate (AUGMENTIN) 875-125 MG tablet; Take 1 tablet by mouth 2 (two) times daily.  Dispense: 14 tablet; Refill: 0  2. Depression -Pt started on lexapro 10 mg today -Stress management discussed -RTO in 4 weeks - escitalopram (LEXAPRO) 10 MG tablet; Take 1 tablet (10 mg total) by mouth daily.  Dispense: 90 tablet; Refill: [place], FNP

## 2015-11-05 DIAGNOSIS — M47817 Spondylosis without myelopathy or radiculopathy, lumbosacral region: Secondary | ICD-10-CM | POA: Diagnosis not present

## 2015-11-05 DIAGNOSIS — M47812 Spondylosis without myelopathy or radiculopathy, cervical region: Secondary | ICD-10-CM | POA: Diagnosis not present

## 2015-11-05 DIAGNOSIS — Z79899 Other long term (current) drug therapy: Secondary | ICD-10-CM | POA: Diagnosis not present

## 2015-11-05 DIAGNOSIS — M5136 Other intervertebral disc degeneration, lumbar region: Secondary | ICD-10-CM | POA: Diagnosis not present

## 2015-11-05 DIAGNOSIS — G8929 Other chronic pain: Secondary | ICD-10-CM | POA: Diagnosis not present

## 2015-11-05 DIAGNOSIS — M9903 Segmental and somatic dysfunction of lumbar region: Secondary | ICD-10-CM | POA: Diagnosis not present

## 2015-11-05 DIAGNOSIS — M9902 Segmental and somatic dysfunction of thoracic region: Secondary | ICD-10-CM | POA: Diagnosis not present

## 2015-11-05 DIAGNOSIS — M546 Pain in thoracic spine: Secondary | ICD-10-CM | POA: Diagnosis not present

## 2015-11-05 DIAGNOSIS — M9901 Segmental and somatic dysfunction of cervical region: Secondary | ICD-10-CM | POA: Diagnosis not present

## 2015-11-05 DIAGNOSIS — E1142 Type 2 diabetes mellitus with diabetic polyneuropathy: Secondary | ICD-10-CM | POA: Diagnosis not present

## 2015-11-12 ENCOUNTER — Ambulatory Visit: Payer: Medicare Other | Admitting: Family

## 2015-11-14 ENCOUNTER — Ambulatory Visit (INDEPENDENT_AMBULATORY_CARE_PROVIDER_SITE_OTHER): Payer: Medicare Other | Admitting: Family

## 2015-11-14 ENCOUNTER — Encounter: Payer: Self-pay | Admitting: Family

## 2015-11-14 VITALS — BP 125/73 | HR 65 | Temp 97.2°F | Ht 74.0 in | Wt 270.0 lb

## 2015-11-14 DIAGNOSIS — J0101 Acute recurrent maxillary sinusitis: Secondary | ICD-10-CM | POA: Diagnosis not present

## 2015-11-14 MED ORDER — DOXYCYCLINE HYCLATE 100 MG PO TABS: 100.0000 mg | ORAL_TABLET | Freq: Two times a day (BID) | ORAL | Status: DC

## 2015-11-14 MED ORDER — AZITHROMYCIN 250 MG PO TABS: ORAL_TABLET | ORAL | Status: DC

## 2015-11-14 NOTE — Patient Instructions (Signed)
Sinusitis, Adult Sinusitis is redness, soreness, and inflammation of the paranasal sinuses. Paranasal sinuses are air pockets within the bones of your face. They are located beneath your eyes, in the middle of your forehead, and above your eyes. In healthy paranasal sinuses, mucus is able to drain out, and air is able to circulate through them by way of your nose. However, when your paranasal sinuses are inflamed, mucus and air can become trapped. This can allow bacteria and other germs to grow and cause infection. Sinusitis can develop quickly and last only a short time (acute) or continue over a long period (chronic). Sinusitis that lasts for more than 12 weeks is considered chronic. CAUSES Causes of sinusitis include:  Allergies.  Structural abnormalities, such as displacement of the cartilage that separates your nostrils (deviated septum), which can decrease the air flow through your nose and sinuses and affect sinus drainage.  Functional abnormalities, such as when the small hairs (cilia) that line your sinuses and help remove mucus do not work properly or are not present. SIGNS AND SYMPTOMS Symptoms of acute and chronic sinusitis are the same. The primary symptoms are pain and pressure around the affected sinuses. Other symptoms include:  Upper toothache.  Earache.  Headache.  Bad breath.  Decreased sense of smell and taste.  A cough, which worsens when you are lying flat.  Fatigue.  Fever.  Thick drainage from your nose, which often is green and may contain pus (purulent).  Swelling and warmth over the affected sinuses. DIAGNOSIS Your health care provider will perform a physical exam. During your exam, your health care provider may perform any of the following to help determine if you have acute sinusitis or chronic sinusitis:  Look in your nose for signs of abnormal growths in your nostrils (nasal polyps).  Tap over the affected sinus to check for signs of  infection.  View the inside of your sinuses using an imaging device that has a light attached (endoscope). If your health care provider suspects that you have chronic sinusitis, one or more of the following tests may be recommended:  Allergy tests.  Nasal culture. A sample of mucus is taken from your nose, sent to a lab, and screened for bacteria.  Nasal cytology. A sample of mucus is taken from your nose and examined by your health care provider to determine if your sinusitis is related to an allergy. TREATMENT Most cases of acute sinusitis are related to a viral infection and will resolve on their own within 10 days. Sometimes, medicines are prescribed to help relieve symptoms of both acute and chronic sinusitis. These may include pain medicines, decongestants, nasal steroid sprays, or saline sprays. However, for sinusitis related to a bacterial infection, your health care provider will prescribe antibiotic medicines. These are medicines that will help kill the bacteria causing the infection. Rarely, sinusitis is caused by a fungal infection. In these cases, your health care provider will prescribe antifungal medicine. For some cases of chronic sinusitis, surgery is needed. Generally, these are cases in which sinusitis recurs more than 3 times per year, despite other treatments. HOME CARE INSTRUCTIONS  Drink plenty of water. Water helps thin the mucus so your sinuses can drain more easily.  Use a humidifier.  Inhale steam 3-4 times a day (for example, sit in the bathroom with the shower running).  Apply a warm, moist washcloth to your face 3-4 times a day, or as directed by your health care provider.  Use saline nasal sprays to help   moisten and clean your sinuses.  Take medicines only as directed by your health care provider.  If you were prescribed either an antibiotic or antifungal medicine, finish it all even if you start to feel better. SEEK IMMEDIATE MEDICAL CARE IF:  You have  increasing pain or severe headaches.  You have nausea, vomiting, or drowsiness.  You have swelling around your face.  You have vision problems.  You have a stiff neck.  You have difficulty breathing.   This information is not intended to replace advice given to you by your health care provider. Make sure you discuss any questions you have with your health care provider.   Document Released: 05/04/2005 Document Revised: 05/25/2014 Document Reviewed: 05/19/2011 Elsevier Interactive Patient Education 2016 [employer]  - Take meds as prescribed - Use a cool mist humidifier  -Use saline nose sprays frequently -Saline irrigations of the nose can be very helpful if done frequently.  * 4X daily for 1 week*  * Use of a nettie pot can be helpful with this. Follow directions with this* -Force fluids -For any cough or congestion  Use plain Mucinex- regular strength or max strength is fine   * Children- consult with Pharmacist for dosing -For fever or aces or pains- take tylenol or ibuprofen appropriate for age and weight.  * for fevers greater than 101 orally you may alternate ibuprofen and tylenol every  3 hours. -Throat lozenges if help   Breanna Shorkey, FNP   

## 2015-11-14 NOTE — Progress Notes (Signed)
Subjective:    Patient ID: [name] [name], male    DOB: 1951/02/28, 65 y.o.   MRN: [id number]  Sinus Problem This is a recurrent problem. The current episode started 1 to 4 weeks ago. The problem is unchanged. There has been no fever. His pain is at a severity of 7/10. The pain is moderate. Associated symptoms include congestion, coughing, ear pain, headaches, a hoarse voice, shortness of breath, sinus pressure, sneezing, a sore throat and swollen glands. Pertinent negatives include no chills. Past treatments include acetaminophen, spray decongestants, antibiotics and oral decongestants. The treatment provided mild relief.  Headache  Associated symptoms include coughing, ear pain, sinus pressure, a sore throat and swollen glands.      Review of Systems  Constitutional: Negative.  Negative for chills.  HENT: Positive for congestion, ear pain, hoarse voice, sinus pressure, sneezing and sore throat.   Respiratory: Positive for cough and shortness of breath.   Cardiovascular: Negative.   Gastrointestinal: Negative.   Endocrine: Negative.   Genitourinary: Negative.   Musculoskeletal: Negative.   Neurological: Positive for headaches.  Hematological: Negative.   Psychiatric/Behavioral: Negative.   All other systems reviewed and are negative.      Objective:   Physical Exam  Constitutional: He is oriented to person, place, and time. He appears well-developed and well-nourished. No distress.  HENT:  Head: Normocephalic.  Right Ear: External ear normal. Tympanic membrane is bulging.  Left Ear: External ear normal.  Nose: Mucosal edema, rhinorrhea and sinus tenderness present. Right sinus exhibits maxillary sinus tenderness (trace swelling) and frontal sinus tenderness. Left sinus exhibits maxillary sinus tenderness (trace swelling) and frontal sinus tenderness.  Mouth/Throat: Posterior oropharyngeal edema present.  Eyes: Pupils are equal, round, and reactive to light. Right eye exhibits no  discharge. Left eye exhibits no discharge.  Neck: Normal range of motion. Neck supple. No thyromegaly present.  Cardiovascular: Normal rate, regular rhythm, normal heart sounds and intact distal pulses.   No murmur heard. Pulmonary/Chest: Effort normal and breath sounds normal. No respiratory distress. He has no wheezes.  Abdominal: Soft. Bowel sounds are normal. He exhibits no distension. There is no tenderness.  Musculoskeletal: Normal range of motion. He exhibits no edema or tenderness.  Neurological: He is alert and oriented to person, place, and time. He has normal reflexes. No cranial nerve deficit.  Skin: Skin is warm and dry. No rash noted. No erythema.  Psychiatric: He has a normal mood and affect. His behavior is normal. Judgment and thought content normal.  Vitals reviewed.     BP 125/73 mmHg  Pulse 65  Temp(Src) 97.2 F (36.2 C) (Oral)  Ht 6\' 2"  (1.88 m)  Wt 270 lb (122.471 kg)  BMI 34.65 kg/m2     Assessment & Plan:  1. Acute recurrent maxillary sinusitis -- Take meds as prescribed - Use a cool mist humidifier  -Use saline nose sprays frequently -Saline irrigations of the nose can be very helpful if done frequently.  * 4X daily for 1 week*  * Use of a nettie pot can be helpful with this. Follow directions with this* -Force fluids -For any cough or congestion  Use plain Mucinex- regular strength or max strength is fine   * Children- consult with Pharmacist for dosing -For fever or aces or pains- take tylenol or ibuprofen appropriate for age and weight.  * for fevers greater than 101 orally you may alternate ibuprofen and tylenol every  3 hours. -Throat lozenges if help -Continue nasonex  - doxycycline (  VIBRA-TABS) 100 MG tablet; Take 1 tablet (100 mg total) by mouth 2 (two) times daily.  Dispense: 20 tablet; Refill: 0  [name], FNP

## 2015-12-01 ENCOUNTER — Other Ambulatory Visit: Payer: Self-pay | Admitting: Family Medicine

## 2015-12-02 ENCOUNTER — Ambulatory Visit: Payer: Medicare Other | Admitting: Family

## 2015-12-02 DIAGNOSIS — M5136 Other intervertebral disc degeneration, lumbar region: Secondary | ICD-10-CM | POA: Diagnosis not present

## 2015-12-03 ENCOUNTER — Ambulatory Visit: Payer: Medicare Other | Admitting: Family

## 2015-12-11 ENCOUNTER — Encounter: Payer: Self-pay | Admitting: Pharmacist

## 2015-12-11 ENCOUNTER — Ambulatory Visit (INDEPENDENT_AMBULATORY_CARE_PROVIDER_SITE_OTHER): Payer: Medicare Other | Admitting: Pharmacist

## 2015-12-11 VITALS — BP 130/60 | HR 66 | Ht 74.0 in | Wt 279.5 lb

## 2015-12-11 DIAGNOSIS — Z0000 Encounter for general adult medical examination without abnormal findings: Secondary | ICD-10-CM

## 2015-12-11 MED ORDER — ESCITALOPRAM OXALATE 20 MG PO TABS: 20.0000 mg | ORAL_TABLET | Freq: Every day | ORAL | 0 refills | Status: DC

## 2015-12-11 NOTE — Progress Notes (Signed)
Subjective:   [name] [name] is a 65 y.o. male who presents for a subsequent Medicare Annual Wellness Visit.  [name] [name] is married (wife if present with him in office today). 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   HENT: Positive for congestion and ear pain.   Eyes: Negative.   Respiratory: Negative.   Cardiovascular:       Trace edema in both LE   Gastrointestinal: Negative.   Genitourinary: Negative.   Musculoskeletal: Positive for back pain (chronic pain managed by Dr Francesco Runner) and falls.  Skin: Negative.   Neurological: Positive for dizziness (patient believes related to continued congestion / sinus infection).  Psychiatric/Behavioral: Positive for depression. The patient is nervous/anxious.       Current Medications (verified) Outpatient Encounter Prescriptions as of 12/11/2015  Medication Sig  . ALPRAZolam (XANAX) 0.5 MG tablet Take 1 tablet (0.5 mg total) by mouth 2 (two) times daily as needed. for anxiety  . aspirin (ASPIRIN EC) 81 MG EC tablet Take by mouth.  . furosemide (LASIX) 20 MG tablet TAKE ONE TABLET BY MOUTH ONCE DAILY AS NEEDED  . gabapentin (NEURONTIN) 300 MG capsule Take 1 capsule (300 mg total) by mouth 3 (three) times daily.  . insulin NPH-regular Human (HUMULIN 70/30) (70-30) 100 UNIT/ML injection Inject 70 Units into the skin 2 (two) times daily with a meal. (Patient taking differently: Inject 75 Units into the skin 2 (two) times daily with a meal. )  . Insulin Pen Needle (PEN NEEDLES) 31G X 6 MM MISC Use to inject insulin two to three times a day  . isosorbide mononitrate (IMDUR) 30 MG 24 hr tablet Take 15 mg by mouth daily.   . Lactobacillus (PROBIOTIC ACIDOPHILUS PO) Take 1 capsule by mouth daily.  . meloxicam (MOBIC) 15 MG tablet Take 1 tablet (15 mg total) by mouth daily.  . metoprolol tartrate (LOPRESSOR) 25 MG tablet Take 0.5 tablets (12.5  mg total) by mouth 2 (two) times daily.  . mometasone (NASONEX) 50 MCG/ACT nasal spray Place 2 sprays into the nose daily.  Marland Kitchen omeprazole (PRILOSEC) 40 MG capsule TAKE ONE CAPSULE BY MOUTH TWICE DAILY HALF AN HOUR BEFORE BREAKFAST AND SUPPER  . Red Yeast Rice Extract (RED YEAST RICE PO) Take 2 tablets by mouth daily.   . sitaGLIPtin (JANUVIA) 100 MG tablet Take 1 tablet (100 mg total) by mouth daily.  Marland Kitchen telmisartan (MICARDIS) 40 MG tablet TAKE ONE TABLET BY MOUTH ONCE DAILY (REPLACING  LISINOPRIL)  . [DISCONTINUED] escitalopram (LEXAPRO) 10 MG tablet Take 1 tablet (10 mg total) by mouth daily.  Marland Kitchen escitalopram (LEXAPRO) 20 MG tablet Take 1 tablet (20 mg total) by mouth daily.  . [DISCONTINUED] azithromycin (ZITHROMAX Z-PAK) 250 MG tablet As directed (Patient not taking: Reported on 12/11/2015)  . [DISCONTINUED] isosorbide mononitrate (IMDUR) 30 MG 24 hr tablet TAKE ONE TABLET BY MOUTH ONCE DAILY  . [DISCONTINUED] Omega-3 Fatty Acids (FISH OIL) 1000 MG CAPS Take 2 capsules by mouth 2 (two) times daily. Reported on 11/14/2015   No facility-administered encounter medications on file as of 12/11/2015.     Allergies (verified) Shellfish allergy; Ace inhibitors; Invokana [canagliflozin]; Metformin and related; Pravastatin sodium; Fenofibrate; Horse-derived products; Iodine; Lisinopril; Crestor [rosuvastatin]; and Lipitor [atorvastatin]   History: Past Medical History:  Diagnosis Date  . AODM   . Arthritis   . CAD (coronary artery disease)    2010  LAD 50%, RCA 100%.  DES to RCA.  EF 55%  . Cellulitis and abscess rt groin   . CHEST PAIN-UNSPECIFIED   . Diabetes mellitus   . Disorders of iron metabolism   . DISORDERS OF IRON METABOLISM   . GERD (gastroesophageal reflux disease)   . Hyperlipidemia   . Hypertension   . Low serum testosterone level   . Medically noncompliant   . MURMUR    Past Surgical History:  Procedure Laterality Date  . BACK SURGERY  2015   ACDF by Dr. [name]  . COLONOSCOPY  N/A 10/01/2014   Procedure: COLONOSCOPY;  Surgeon: Daneil Dolin, MD;  Location: [facility];  Service: Endoscopy;  Laterality: N/A;  815  . CORONARY STENT PLACEMENT  2000   By Dr. Olevia Perches  . ESOPHAGOGASTRODUODENOSCOPY     esophagus [facility] at [facility]  . ESOPHAGOGASTRODUODENOSCOPY N/A 10/01/2014   Procedure: ESOPHAGOGASTRODUODENOSCOPY (EGD);  Surgeon: Daneil Dolin, MD;  Location: [facility];  Service: Endoscopy;  Laterality: N/A;  . HERNIA REPAIR  [id number]   umbilical  . LESION REMOVAL     Lip and hand   . NECK SURGERY     Family History  Problem Relation Age of Onset  . Diabetes Father   . Valvular heart disease Father   . Arthritis Father   . Heart disease Father   . Alzheimer's disease Mother   . Hyperlipidemia Mother   . Hypertension Mother   . Cancer Mother     lung  . Arthritis Mother   . Arthritis Sister     rheumatoid  . Diabetes Sister   . Hypertension Sister   . Hyperlipidemia Sister   . Dementia Maternal Aunt   . Dementia Maternal Uncle   . Heart disease Maternal Uncle   . Cancer Paternal Uncle     stomach  . Colon cancer Neg Hx   . Liver disease Neg Hx    Social History   Occupational History  . Not on file.   Social History Main Topics  . Smoking status: Current Some Day Smoker    Packs/day: 0.25    Years: 51.00    Types: Cigarettes    Last attempt to quit: 08/02/2014  . Smokeless tobacco: Never Used  . Alcohol use No     Comment: heavy in past, none in 10 years.   . Drug use: No  . Sexual activity: Yes    Do you feel safe at home?  Yes Are there smokers in your home (other than you)? No  Dietary issues and exercise activities discussed: Current Exercise Habits: The patient does not participate in regular exercise at present  Current Dietary habits:  Over the last 4-6 months patient has been following a very low CHO diet and high protein initially recommended by his chiropractor.  He limits bread, potatoes and rice.  Eats low fat  cheese, egg whites, and vegetables.    Cardiac Risk Factors include: advanced age (>65men, >32 women);diabetes mellitus;dyslipidemia;family history of premature cardiovascular disease;male gender;microalbuminuria;obesity (BMI >30kg/m2);smoking/ tobacco exposure;sedentary lifestyle  Objective:    Today's Vitals   12/11/15 0823 12/11/15 0850  BP: 136/70 130/60  Pulse: 66   Weight: 279 lb 8 oz (126.8 kg)   Height: 6\' 2"  (1.88 m)   PainSc: 4     Body mass index is 35.89 kg/m.  Lat A1c = 6.5%  Diabetic Foot Form - Detailed   Diabetic Foot Exam - detailed Diabetic Foot exam was performed with the  following findings:  Yes 12/11/2015  8:57 AM  Visual Foot Exam completed.:  Yes  Is there a history of foot ulcer?:  No Can the patient see the bottom of their feet?:  Yes Are the shoes appropriate in style and fit?:  Yes Is there swelling or and abnormal foot shape?:  Yes (Comment: trace edema bialterally) Are the toenails long?:  No Are the toenails thick?:  No Do you have pain in calf while walking?:  No Is there a claw toe deformity?:  No Is there elevated skin temparature?:  No Is there limited skin dorsiflexion?:  No Is there foot or ankle muscle weakness?:  No Are the toenails ingrown?:  No Normal Range of Motion:  Yes Pulse Foot Exam completed.:  Yes  Right posterior Tibialias:  Diminished Left posterior Tibialias:  Diminished  Right Dorsalis Pedis:  Diminished Left Dorsalis Pedis:  Diminished  Sensory Foot Exam Completed.:  Yes Swelling:  Yes (Comment: trace edema bialterally) Semmes-Weinstein Monofilament Test R Foot Test Control:  Pos L Foot Test Control:  Pos  R Site 1-Great Toe:  Pos L Site 1-Great Toe:  Pos  R Site 4:  Pos L Site 4:  Pos  R Site 5:  Pos L Site 5:  Pos    Comments:  Normal sensation both feet       Activities of Daily Living In your present state of health, do you have any difficulty performing the following activities: 12/11/2015 09/17/2015    Hearing? Y N  Vision? N N  Difficulty concentrating or making decisions? N N  Walking or climbing stairs? Y N  Dressing or bathing? N N  Doing errands, shopping? N N  Preparing Food and eating ? N -  Using the Toilet? N -  In the past six months, have you accidently leaked urine? N -  Do you have problems with loss of bowel control? N -  Managing your Medications? N -  Managing your Finances? N -  Housekeeping or managing your Housekeeping? N -  Some recent data might be hidden     Depression Screen PHQ 2/9 Scores 12/11/2015 12/11/2015 11/14/2015 09/17/2015  PHQ - 2 Score 5 5 0 3  PHQ- 9 Score 11 - - 12     Fall Risk Fall Risk  12/11/2015 11/14/2015 09/17/2015 06/13/2015 02/11/2015  Falls in the past year? Yes Yes Yes Yes Yes  Number falls in past yr: 2 or more 1 1 2  or more 1  Injury with Fall? No No No - No  Risk for fall due to : History of fall(s);Impaired balance/gait;Medication side effect - - - -  Follow up Falls prevention discussed;Falls evaluation completed - - - -    Cognitive Function: MMSE - Mini Mental State Exam 12/11/2015 08/01/2014  Orientation to time 5 5  Orientation to Place 5 5  Registration 3 3  Attention/ Calculation 5 5  Recall 3 1  Language- name 2 objects 2 1  Language- repeat 1 1  Language- follow 3 step command 3 3  Language- read & follow direction 1 1  Write a sentence 1 1  Copy design 1 1  Total score 30 27    Immunizations and Health Maintenance Immunization History  Administered Date(s) Administered  . Tdap 09/17/2015   Health Maintenance Due  Topic Date Due  . Hepatitis C Screening  11/25/1950  . HIV Screening  10/11/1965  . ZOSTAVAX  10/12/2010  . PNA vac Low Risk Adult (1 of 2 -  PCV13) 10/12/2015  . FOOT EXAM  11/08/2015    Patient Care Team: Claretta Fraise, MD as PCP - General (Family Medicine) Rodolph Bong, MD as Attending Physician (Cardiology) Glenna Fellows, MD as Attending Physician (Neurosurgery) Lendon Colonel, NP as  Nurse Practitioner (Nurse Practitioner) [name], MD as Consulting Physician (Vascular Surgery) Carolan Clines, MD as Consulting Physician (Urology) Daneil Dolin, MD as Consulting Physician (Gastroenterology) Dorene Ar, MD as Consulting Physician (Pain Medicine)  Indicate any recent Medical Services you may have received from other than [facility] providers in the past year (date may be approximate).    Assessment:    Annual Wellness Visit  Type 2 DM - well controlled with current regimen and diet changes High fall risk related to dizziness no orthostatic hypotension detected in office today. Congestion Depression - improved slightly with addition of escitalopram   Screening Tests Health Maintenance  Topic Date Due  . Hepatitis C Screening  [birth year]  . HIV Screening  10/11/1965  . ZOSTAVAX  10/12/2010  . PNA vac Low Risk Adult (1 of 2 - PCV13) 10/12/2015  . FOOT EXAM  11/08/2015  . INFLUENZA VACCINE  02/11/2016 (Originally 12/17/2015)  . OPHTHALMOLOGY EXAM  12/25/2015  . HEMOGLOBIN A1C  03/19/2016  . COLONOSCOPY  09/30/2024  . TETANUS/TDAP  09/16/2025        Plan:   During the course of the visit [name] [name] was educated and counseled about the following appropriate screening and preventive services:   Vaccines to include Pneumoccal, Influenza,  Td, Zostavax - patient refused all recommended vaccines - influenza, Zostavax and pneumonia.   Colorectal cancer screening - UTD  Cardiovascular disease screening - LE [place] - last 2016; ECHO last; EKG last 2016  Diabetes - well controlled currently, continue current meds  Glaucoma screening / Diabetic Eye Exam - due next month; reminded patient to make follow up appt  Nutrition counseling - continue with low CHO diet.    Smoking cessation counseling - patient declined pharmacotherapy.  Discussed trigger avoidance and other tips to help with smoking cessation  Recommended CT of lungs for lung cancer screening - patient  declined.   Advanced Directives - information provided for patient and wife  Physical Activity - increase as able   Hep C and HIV screening - patient declined  Patient referred back to PCP for reassess congestion, ear pain and dizziness (chronic infection vs other cause)  Increase escitalopram to 20mg  daily     Patient Instructions (the written plan) were given to the patient.   Cherre Robins, PharmD   12/11/2015

## 2015-12-11 NOTE — Patient Instructions (Addendum)
[name] [name] , Thank you for taking time to come for your Medicare Wellness Visit. I appreciate your ongoing commitment to your health goals. Please review the following plan we discussed and let me know if I can assist you in the future.   These are the goals we discussed: Consider CT of lungs for lung cancer screening Physical activity goal - 150 minutes per week - start where you can - 5 to 10 minutes a day.   Increase escitalopram to 20mg  daily   This is a list of the screening recommended for you and due dates:  Health Maintenance  Topic Date Due  .  Hepatitis C: One time screening is recommended by Center for Disease Control  (CDC) for  adults born from [birth year] through 1965.   Aug 03, 1950  . HIV Screening  10/11/1965  . Shingles Vaccine  10/12/2010  . Pneumonia vaccines (1 of 2 - PCV13) 10/12/2015  . Complete foot exam   11/08/2015  . Flu Shot  02/11/2016*  . Eye exam for diabetics  12/25/2015  . Hemoglobin A1C  03/19/2016  . Colon Cancer Screening  09/30/2024  . Tetanus Vaccine  09/16/2025  *Topic was postponed. The date shown is not the original due date.   Health Maintenance, Male A healthy lifestyle and preventative care can promote health and wellness.  Maintain regular health, dental, and eye exams.  Eat a healthy diet. Foods like vegetables, fruits, whole grains, low-fat dairy products, and lean protein foods contain the nutrients you need and are low in calories. Decrease your intake of foods high in solid fats, added sugars, and salt. Get information about a proper diet from your health care provider, if necessary.  Regular physical exercise is one of the most important things you can do for your health. Most adults should get at least 150 minutes of moderate-intensity exercise (any activity that increases your heart rate and causes you to sweat) each week. In addition, most adults need muscle-strengthening exercises on 2 or more days a week.   Maintain a healthy weight.  The body mass index (BMI) is a screening tool to identify possible weight problems. It provides an estimate of body fat based on height and weight. Your health care provider can find your BMI and can help you achieve or maintain a healthy weight. For males 20 years and older:  A BMI below 18.5 is considered underweight.  A BMI of 18.5 to 24.9 is normal.  A BMI of 25 to 29.9 is considered overweight.  A BMI of 30 and above is considered obese.  Maintain normal blood lipids and cholesterol by exercising and minimizing your intake of saturated fat. Eat a balanced diet with plenty of fruits and vegetables. Blood tests for lipids and cholesterol should begin at age 32 and be repeated every 5 years. If your lipid or cholesterol levels are high, you are over age 31, or you are at high risk for heart disease, you may need your cholesterol levels checked more frequently.Ongoing high lipid and cholesterol levels should be treated with medicines if diet and exercise are not working.  If you smoke, find out from your health care provider how to quit. If you do not use tobacco, do not start.  Lung cancer screening is recommended for adults aged 22-80 years who are at high risk for developing lung cancer because of a history of smoking. A yearly low-dose CT scan of the lungs is recommended for people who have at least a 30-pack-year history of  smoking and are current smokers or have quit within the past 15 years. A pack year of smoking is smoking an average of 1 pack of cigarettes a day for 1 year (for example, a 30-pack-year history of smoking could mean smoking 1 pack a day for 30 years or 2 packs a day for 15 years). Yearly screening should continue until the smoker has stopped smoking for at least 15 years. Yearly screening should be stopped for people who develop a health problem that would prevent them from having lung cancer treatment.  If you choose to drink alcohol, do not have more than 2 drinks per  day. One drink is considered to be 12 oz (360 mL) of beer, 5 oz (150 mL) of wine, or 1.5 oz (45 mL) of liquor.  Avoid the use of street drugs. Do not share needles with anyone. Ask for help if you need support or instructions about stopping the use of drugs.  High blood pressure causes heart disease and increases the risk of stroke. High blood pressure is more likely to develop in:  People who have blood pressure in the end of the normal range (100-139/85-89 mm Hg).  People who are overweight or obese.  People who are African American.  If you are 19-37 years of age, have your blood pressure checked every 3-5 years. If you are 65 years of age or older, have your blood pressure checked every year. You should have your blood pressure measured twice--once when you are at a hospital or clinic, and once when you are not at a hospital or clinic. Record the average of the two measurements. To check your blood pressure when you are not at a hospital or clinic, you can use:  An automated blood pressure machine at a pharmacy.  A home blood pressure monitor.  If you are 24-31 years old, ask your health care provider if you should take aspirin to prevent heart disease.  Diabetes screening involves taking a blood sample to check your fasting blood sugar level. This should be done once every 3 years after age 11 if you are at a normal weight and without risk factors for diabetes. Testing should be considered at a younger age or be carried out more frequently if you are overweight and have at least 1 risk factor for diabetes.  Colorectal cancer can be detected and often prevented. Most routine colorectal cancer screening begins at the age of 17 and continues through age 65. However, your health care provider may recommend screening at an earlier age if you have risk factors for colon cancer. On a yearly basis, your health care provider may provide home test kits to check for hidden blood in the stool. A small  camera at the end of a tube may be used to directly examine the colon (sigmoidoscopy or colonoscopy) to detect the earliest forms of colorectal cancer. Talk to your health care provider about this at age 81 when routine screening begins. A direct exam of the colon should be repeated every 5-10 years through age 67, unless early forms of precancerous polyps or small growths are found.  People who are at an increased risk for hepatitis B should be screened for this virus. You are considered at high risk for hepatitis B if:  You were born in a country where hepatitis B occurs often. Talk with your health care provider about which countries are considered high risk.  Your parents were born in a high-risk country and you have not  received a shot to protect against hepatitis B (hepatitis B vaccine).  You have HIV or AIDS.  You use needles to inject street drugs.  You live with, or have sex with, someone who has hepatitis B.  You are a man who has sex with other men (MSM).  You get hemodialysis treatment.  You take certain medicines for conditions like cancer, organ transplantation, and autoimmune conditions.  Hepatitis C blood testing is recommended for all people born from [birth year] through 1965 and any individual with known risk factors for hepatitis C.  Healthy men should no longer receive prostate-specific antigen (PSA) blood tests as part of routine cancer screening. Talk to your health care provider about prostate cancer screening.  Testicular cancer screening is not recommended for adolescents or adult males who have no symptoms. Screening includes self-exam, a health care provider exam, and other screening tests. Consult with your health care provider about any symptoms you have or any concerns you have about testicular cancer.  Practice safe sex. Use condoms and avoid high-risk sexual practices to reduce the spread of sexually transmitted infections (STIs).  You should be screened for STIs,  including gonorrhea and chlamydia if:  You are sexually active and are younger than 24 years.  You are older than 24 years, and your health care provider tells you that you are at risk for this type of infection.  Your sexual activity has changed since you were last screened, and you are at an increased risk for chlamydia or gonorrhea. Ask your health care provider if you are at risk.  If you are at risk of being infected with HIV, it is recommended that you take a prescription medicine daily to prevent HIV infection. This is called pre-exposure prophylaxis (PrEP). You are considered at risk if:  You are a man who has sex with other men (MSM).  You are a heterosexual man who is sexually active with multiple partners.  You take drugs by injection.  You are sexually active with a partner who has HIV.  Talk with your health care provider about whether you are at high risk of being infected with HIV. If you choose to begin PrEP, you should first be tested for HIV. You should then be tested every 3 months for as long as you are taking PrEP.  Use sunscreen. Apply sunscreen liberally and repeatedly throughout the day. You should seek shade when your shadow is shorter than you. Protect yourself by wearing long sleeves, pants, a wide-brimmed hat, and sunglasses year round whenever you are outdoors.  Tell your health care provider of new moles or changes in moles, especially if there is a change in shape or color. Also, tell your health care provider if a mole is larger than the size of a pencil eraser.  A one-time screening for abdominal aortic aneurysm (AAA) and surgical repair of large AAAs by ultrasound is recommended for men aged 42-75 years who are current or former smokers.  Stay current with your vaccines (immunizations).   This information is not intended to replace advice given to you by your health care provider. Make sure you discuss any questions you have with your health care  provider.   Document Released: 10/31/2007 Document Revised: 05/25/2014 Document Reviewed: 09/29/2010 Elsevier Interactive Patient Education [employer].

## 2015-12-22 ENCOUNTER — Other Ambulatory Visit: Payer: Self-pay | Admitting: Family Medicine

## 2015-12-23 NOTE — Telephone Encounter (Signed)
Last filled 11/17/15, last seen 11/01/15. Route to pool

## 2015-12-24 NOTE — Telephone Encounter (Signed)
RX for Xanax called into [facility] Okayed per Dr Livia Snellen

## 2015-12-30 ENCOUNTER — Telehealth: Payer: Self-pay | Admitting: Family Medicine

## 2015-12-30 DIAGNOSIS — M5136 Other intervertebral disc degeneration, lumbar region: Secondary | ICD-10-CM | POA: Diagnosis not present

## 2015-12-30 DIAGNOSIS — E1142 Type 2 diabetes mellitus with diabetic polyneuropathy: Secondary | ICD-10-CM | POA: Diagnosis not present

## 2015-12-30 DIAGNOSIS — M47817 Spondylosis without myelopathy or radiculopathy, lumbosacral region: Secondary | ICD-10-CM | POA: Diagnosis not present

## 2015-12-30 DIAGNOSIS — Z79899 Other long term (current) drug therapy: Secondary | ICD-10-CM | POA: Diagnosis not present

## 2015-12-30 DIAGNOSIS — G8929 Other chronic pain: Secondary | ICD-10-CM | POA: Diagnosis not present

## 2015-12-30 MED ORDER — SITAGLIPTIN PHOSPHATE 100 MG PO TABS: 100.0000 mg | ORAL_TABLET | Freq: Every day | ORAL | 0 refills | Status: DC

## 2015-12-30 MED ORDER — SERTRALINE HCL 50 MG PO TABS: 50.0000 mg | ORAL_TABLET | Freq: Every day | ORAL | 0 refills | Status: DC

## 2015-12-30 NOTE — Telephone Encounter (Signed)
#  28 Januvia left for patient up front.  Patient also had stopped escitalopram because he felt that his affect was flat / " I wasn't enjoying the activities I usually did such as playing guitar".  Since stopped this has improved but patient is concerned about having a "bad" day and depression returning.  He had taken sertraline in past after a divorce and it seemed to help.  Rx sent in for sertraline 50mg  qd.

## 2016-01-08 ENCOUNTER — Telehealth: Payer: Self-pay | Admitting: Family Medicine

## 2016-01-08 NOTE — Telephone Encounter (Signed)
Patient will need face to face visit with documentation from PCP and Rx

## 2016-01-09 NOTE — Telephone Encounter (Signed)
Appt made w/ Alyse Low

## 2016-01-13 ENCOUNTER — Ambulatory Visit (INDEPENDENT_AMBULATORY_CARE_PROVIDER_SITE_OTHER): Payer: Medicare Other | Admitting: Family

## 2016-01-13 ENCOUNTER — Encounter: Payer: Self-pay | Admitting: Family

## 2016-01-13 VITALS — BP 127/65 | HR 64 | Temp 97.5°F | Ht 74.0 in | Wt 281.4 lb

## 2016-01-13 DIAGNOSIS — E1142 Type 2 diabetes mellitus with diabetic polyneuropathy: Secondary | ICD-10-CM

## 2016-01-13 DIAGNOSIS — E1140 Type 2 diabetes mellitus with diabetic neuropathy, unspecified: Secondary | ICD-10-CM | POA: Insufficient documentation

## 2016-01-13 NOTE — Progress Notes (Signed)
   Subjective:    Patient ID: [name] [name], male    DOB: 1950/09/09, 65 y.o.   MRN: [id number]  Pt presents to the office today for face to face visit for diabetic shoes.    Diabetes  He presents for his follow-up diabetic visit. He has type 2 diabetes mellitus. His disease course has been fluctuating. Hypoglycemia symptoms include dizziness. Associated symptoms include foot paresthesias. Pertinent negatives for diabetes include no blurred vision, no polyphagia, no visual change and no weakness. Symptoms are worsening. Diabetic complications include heart disease and peripheral neuropathy. Pertinent negatives for diabetic complications include no CVA or nephropathy. Risk factors for coronary artery disease include dyslipidemia, diabetes mellitus, obesity, hypertension and family history. Current diabetic treatment includes oral agent (triple therapy) and insulin injections. He is compliant with treatment all of the time. He is following a generally healthy diet. His breakfast blood glucose range is generally 110-130 mg/dl. Eye exam is current.      Review of Systems  Constitutional: Negative.   HENT: Negative.   Eyes: Negative for blurred vision.  Respiratory: Negative.   Cardiovascular: Negative.   Gastrointestinal: Negative.   Endocrine: Negative.  Negative for polyphagia.  Genitourinary: Negative.   Musculoskeletal: Negative.   Neurological: Positive for dizziness. Negative for weakness.  Hematological: Negative.   Psychiatric/Behavioral: Negative.   All other systems reviewed and are negative.      Objective:   Physical Exam  Constitutional: He is oriented to person, place, and time. He appears well-developed and well-nourished. No distress.  HENT:  Head: Normocephalic.  Cardiovascular: Normal rate, regular rhythm, normal heart sounds and intact distal pulses.   No murmur heard. Pulmonary/Chest: Effort normal and breath sounds normal. No respiratory distress. He has no  wheezes.  Abdominal: Soft. Bowel sounds are normal. He exhibits no distension. There is no tenderness.  Musculoskeletal: Normal range of motion. He exhibits no edema or tenderness.  Neurological: He is alert and oriented to person, place, and time.  Negative monofilament on left dorsal foot  Skin: Skin is warm and dry. No rash noted. No erythema.  Psychiatric: He has a normal mood and affect. His behavior is normal. Judgment and thought content normal.  Vitals reviewed.   BP 127/65   Pulse 64   Temp 97.5 F (36.4 C) (Oral)   Ht 6\' 2"  (1.88 m)   Wt 281 lb 6.4 oz (127.6 kg)   BMI 36.13 kg/m   See Diabetic foot note     Assessment & Plan:  1. Diabetic peripheral neuropathy associated with type 2 diabetes mellitus ([place])  2. Diabetic polyneuropathy associated with type 2 diabetes mellitus ([place])  RX written for diabetic shoes Continue gabapentin Continue good blood glucose control, last HgbA1C was 6.5 PT to keep chronic follow up appt  [name], FNP

## 2016-01-13 NOTE — Patient Instructions (Signed)

## 2016-01-20 ENCOUNTER — Other Ambulatory Visit: Payer: Self-pay | Admitting: Family Medicine

## 2016-01-22 ENCOUNTER — Other Ambulatory Visit: Payer: Self-pay | Admitting: Family Medicine

## 2016-01-22 NOTE — Telephone Encounter (Signed)
rx called into pharmacy and pt is aware. 

## 2016-01-24 DIAGNOSIS — M546 Pain in thoracic spine: Secondary | ICD-10-CM | POA: Diagnosis not present

## 2016-01-24 DIAGNOSIS — M47816 Spondylosis without myelopathy or radiculopathy, lumbar region: Secondary | ICD-10-CM | POA: Diagnosis not present

## 2016-01-24 DIAGNOSIS — M9902 Segmental and somatic dysfunction of thoracic region: Secondary | ICD-10-CM | POA: Diagnosis not present

## 2016-01-24 DIAGNOSIS — M9901 Segmental and somatic dysfunction of cervical region: Secondary | ICD-10-CM | POA: Diagnosis not present

## 2016-01-24 DIAGNOSIS — M47812 Spondylosis without myelopathy or radiculopathy, cervical region: Secondary | ICD-10-CM | POA: Diagnosis not present

## 2016-01-24 DIAGNOSIS — M4316 Spondylolisthesis, lumbar region: Secondary | ICD-10-CM | POA: Diagnosis not present

## 2016-01-24 DIAGNOSIS — M9903 Segmental and somatic dysfunction of lumbar region: Secondary | ICD-10-CM | POA: Diagnosis not present

## 2016-02-03 ENCOUNTER — Telehealth: Payer: Self-pay | Admitting: Family Medicine

## 2016-02-03 NOTE — Telephone Encounter (Signed)
Patient aware that we are out of Januvia samples

## 2016-02-11 ENCOUNTER — Encounter (INDEPENDENT_AMBULATORY_CARE_PROVIDER_SITE_OTHER): Payer: Self-pay

## 2016-02-11 ENCOUNTER — Ambulatory Visit (INDEPENDENT_AMBULATORY_CARE_PROVIDER_SITE_OTHER): Payer: Medicare Other | Admitting: Family Medicine

## 2016-02-11 ENCOUNTER — Ambulatory Visit (INDEPENDENT_AMBULATORY_CARE_PROVIDER_SITE_OTHER): Payer: Medicare Other

## 2016-02-11 ENCOUNTER — Encounter: Payer: Self-pay | Admitting: Family Medicine

## 2016-02-11 VITALS — BP 167/81 | HR 67 | Temp 96.9°F | Ht 74.0 in | Wt 283.4 lb

## 2016-02-11 DIAGNOSIS — I10 Essential (primary) hypertension: Secondary | ICD-10-CM | POA: Diagnosis not present

## 2016-02-11 DIAGNOSIS — R519 Headache, unspecified: Secondary | ICD-10-CM

## 2016-02-11 DIAGNOSIS — Z794 Long term (current) use of insulin: Secondary | ICD-10-CM

## 2016-02-11 DIAGNOSIS — E118 Type 2 diabetes mellitus with unspecified complications: Secondary | ICD-10-CM | POA: Diagnosis not present

## 2016-02-11 DIAGNOSIS — E1142 Type 2 diabetes mellitus with diabetic polyneuropathy: Secondary | ICD-10-CM | POA: Diagnosis not present

## 2016-02-11 DIAGNOSIS — R51 Headache: Secondary | ICD-10-CM | POA: Diagnosis not present

## 2016-02-11 LAB — URINALYSIS, COMPLETE
Bilirubin, UA: NEGATIVE
Glucose, UA: NEGATIVE
Ketones, UA: NEGATIVE
Leukocytes, UA: NEGATIVE
Nitrite, UA: NEGATIVE
Specific Gravity, UA: 1.015 (ref 1.005–1.030)
Urobilinogen, Ur: 0.2 mg/dL (ref 0.2–1.0)
pH, UA: 6 (ref 5.0–7.5)

## 2016-02-11 LAB — MICROSCOPIC EXAMINATION
Bacteria, UA: NONE SEEN
Epithelial Cells (non renal): NONE SEEN /hpf (ref 0–10)

## 2016-02-11 MED ORDER — PREDNISONE 10 MG PO TABS: ORAL_TABLET | ORAL | 0 refills | Status: DC

## 2016-02-11 MED ORDER — DOXYCYCLINE HYCLATE 100 MG PO CAPS: 100.0000 mg | ORAL_CAPSULE | Freq: Two times a day (BID) | ORAL | 0 refills | Status: DC

## 2016-02-11 NOTE — Progress Notes (Signed)
Subjective:  Patient ID: [name] [name], male    DOB: Oct 17, 1950  Age: 65 y.o. MRN: [id number]  CC: Hypertension (pt here today c/o BP elevated and can't keep it under control, BS have been elevated and he can't get them down even with taking all of his medications, constant headache)   HPI [name] [name] presents for Onset little over 2 weeks ago with symptoms as above. Of note is that he is achy all over. He's had nausea and chills in episodes. These have been accompanied with vomiting spells. He's had some occasional loose bowel movement but no frank diarrhea. He denies any melena or hematochezia. He has upper abdominal pain primarily in the epigastrium. He has been experiencing blood sugars in the 200s to the 300 range. He is actually increased his insulin slightly. Of note is that he had been having sugars in the 100 150 range and brought his A1c down to 6.2 recently. This changed when the symptoms onset 2 weeks ago. Currently he also has problems with blood pressure dropping and getting really tired. He is also noted that things start to look yellow to him. Additionally he's had severe headache. It feels numb all over. He does not have any photophobia or phonophobia with it. It does not throb. It is diffuse, nonfocal. It is moderate in severity and constant.  . History [name] has a past medical history of AODM; Arthritis; CAD (coronary artery disease); Cellulitis and abscess rt groin; CHEST PAIN-UNSPECIFIED; Diabetes mellitus; Disorders of iron metabolism; DISORDERS OF IRON METABOLISM; GERD (gastroesophageal reflux disease); Hyperlipidemia; Hypertension; Low serum testosterone level; Medically noncompliant; and MURMUR.   He has a past surgical history that includes Coronary stent placement (2000); Hernia repair (2000); Back surgery (2015); Lesion removal; Esophagogastroduodenoscopy; Colonoscopy (N/A, 10/01/2014); Esophagogastroduodenoscopy (N/A, 10/01/2014); and Neck surgery.   His family history  includes Alzheimer's disease in his mother; Arthritis in his father, mother, and sister; Cancer in his mother and paternal uncle; Dementia in his maternal aunt and maternal uncle; Diabetes in his father and sister; Heart disease in his father and maternal uncle; Hyperlipidemia in his mother and sister; Hypertension in his mother and sister; Valvular heart disease in his father.He reports that he has been smoking Cigarettes.  He has a 12.75 pack-year smoking history. He has never used smokeless tobacco. He reports that he does not drink alcohol or use drugs.    ROS Review of Systems  Constitutional: Positive for activity change and fatigue. Negative for appetite change, chills, diaphoresis, fever and unexpected weight change.  HENT: Negative for congestion, hearing loss, rhinorrhea and sore throat.   Eyes: Negative for photophobia, pain and visual disturbance.  Respiratory: Positive for chest tightness and shortness of breath. Negative for cough.   Cardiovascular: Negative for chest pain, palpitations and leg swelling.  Gastrointestinal: Positive for abdominal pain, nausea and vomiting. Negative for constipation and diarrhea.  Endocrine: Negative for cold intolerance and heat intolerance.  Genitourinary: Negative for dysuria and flank pain.  Musculoskeletal: Positive for arthralgias and myalgias. Negative for joint swelling.  Skin: Negative for rash.  Allergic/Immunologic: Negative for environmental allergies and food allergies.  Neurological: Positive for dizziness and headaches.  Psychiatric/Behavioral: Positive for dysphoric mood. Negative for agitation, confusion and sleep disturbance.    Objective:  BP (!) 166/78   Pulse 67   Temp (!) 96.9 F (36.1 C) (Oral)   Ht '6\' 2"'$  (1.88 m)   Wt 283 lb 6 oz (128.5 kg)   BMI 36.38 kg/m  BP Readings from Last 3 Encounters:  02/11/16 (!) 166/78  01/13/16 127/65  12/11/15 130/60    Wt Readings from Last 3 Encounters:  02/11/16 283 lb 6 oz  (128.5 kg)  01/13/16 281 lb 6.4 oz (127.6 kg)  12/11/15 279 lb 8 oz (126.8 kg)     Physical Exam  Constitutional: He is oriented to person, place, and time. He appears well-developed and well-nourished. No distress.  HENT:  Head: Normocephalic and atraumatic.  Right Ear: External ear normal.  Left Ear: External ear normal.  Nose: Nose normal.  Mouth/Throat: Oropharynx is clear and moist.  Eyes: Conjunctivae and EOM are normal. Pupils are equal, round, and reactive to light.  Neck: Normal range of motion. Neck supple. No thyromegaly present.  Cardiovascular: Normal rate, regular rhythm and normal heart sounds.   No murmur heard. Pulmonary/Chest: Effort normal and breath sounds normal. No respiratory distress. He has no wheezes. He has no rales.  Abdominal: Soft. Bowel sounds are normal. He exhibits no distension. There is no tenderness.  Lymphadenopathy:    He has no cervical adenopathy.  Neurological: He is alert and oriented to person, place, and time. He has normal reflexes.  Skin: Skin is warm and dry.  Psychiatric: He has a normal mood and affect. His behavior is normal. Judgment and thought content normal.     Lab Results  Component Value Date   WBC 12.4 (H) 09/17/2015   HGB 15.9 08/13/2014   HCT 46.9 09/17/2015   PLT 268 09/17/2015   GLUCOSE 127 (H) 09/17/2015   CHOL 210 (H) 09/17/2015   TRIG 260 (H) 09/17/2015   HDL 24 (L) 09/17/2015   LDLDIRECT 172 (H) 11/08/2014   LDLCALC 134 (H) 09/17/2015   ALT 17 09/17/2015   AST 17 09/17/2015   NA 141 09/17/2015   K 4.9 09/17/2015   CL 98 09/17/2015   CREATININE 1.06 09/17/2015   BUN 25 09/17/2015   CO2 27 09/17/2015   TSH 2.170 01/03/2015   PSA 1.1 05/31/2013   INR 0.99 10/25/2011   HGBA1C 7.4 06/13/2015   MICROALBUR 100 05/31/2013    Mr Lumbar Spine Wo Contrast  Result Date: 12/27/2014 CLINICAL DATA:  Lumbar spondylosis.  Left leg pain and numbness EXAM: MRI LUMBAR SPINE WITHOUT CONTRAST TECHNIQUE: Multiplanar,  multisequence MR imaging of the lumbar spine was performed. No intravenous contrast was administered. COMPARISON:  Lumbar MRI 12/16/2010 FINDINGS: Grade 1 anterior slip L4-5 is similar to the prior study. Negative for fracture or mass. Conus medullaris normal and terminates at L1-2 L1-2:  Mild facet degeneration without stenosis. L2-3: Small left paracentral disc protrusion has developed since the prior study. This appears to be causing impingement of the left L3 nerve root in the subarticular recess. There is bilateral facet hypertrophy and mild spinal stenosis at this level. L3-4: Mild retrolisthesis. Diffuse disc bulging. Moderate facet hypertrophy. Moderate spinal stenosis similar to the prior study. Left foraminal encroachment again noted with impingement of the left L3 nerve root in the foramen due to disc bulging and facet hypertrophy. L4-5: 6 mm anterior slip. Advanced facet degeneration. Diffuse disc bulging. Mild foraminal narrowing bilaterally. L5-S1: Disc degeneration and facet degeneration. Moderate right foraminal encroachment. IMPRESSION: Interval development of small left paracentral disc protrusion at L2-3 with expected impingement of left L3 nerve root in the subarticular recess. There is severe left foraminal encroachment at L3-4 also causing L3 nerve root impingement. This is similar to the prior study Moderate spinal stenosis at L3-4 Grade 1 anterior slip  L4-5 related to facet degeneration. There is mild foraminal narrowing bilaterally at L4-5. Electronically Signed   By: Franchot Gallo M.D.   On: 12/27/2014 20:43    Assessment & Plan:   Arrian was seen today for hypertension.  Diagnoses and all orders for this visit:  Type 2 diabetes mellitus with complication, with long-term current use of insulin ([place]) -     CBC with Differential/Platelet -     CMP14+EGFR -     DG Chest 2 View; Future -     TSH -     D-dimer, quantitative (not at [facility]) -     Lyme Ab/Western Blot Reflex -      Rocky mtn spotted fvr abs pnl(IgG+IgM) -     Urine culture -     Urinalysis, Complete -     Urinalysis -     Sedimentation rate  Diabetic peripheral neuropathy associated with type 2 diabetes mellitus (HCC)  Essential hypertension, benign -     CBC with Differential/Platelet -     CMP14+EGFR -     DG Chest 2 View; Future -     TSH -     D-dimer, quantitative (not at [name]) -     Lyme Ab/Western Blot Reflex -     Rocky mtn spotted fvr abs pnl(IgG+IgM) -     Urine culture -     Urinalysis, Complete -     Urinalysis -     Sedimentation rate  Intractable headache, unspecified chronicity pattern, unspecified headache type -     CBC with Differential/Platelet -     CMP14+EGFR -     DG Chest 2 View; Future -     TSH -     D-dimer, quantitative (not at [facility]) -     Lyme Ab/Western Blot Reflex -     Rocky mtn spotted fvr abs pnl(IgG+IgM) -     Urine culture -     Urinalysis, Complete -     Urinalysis -     Sedimentation rate      I am having Mr. Matters maintain his Lactobacillus (PROBIOTIC ACIDOPHILUS PO), Red Yeast Rice Extract (RED YEAST RICE PO), Pen Needles, mometasone, furosemide, gabapentin, meloxicam, metoprolol tartrate, omeprazole, telmisartan, insulin NPH-regular Human, aspirin, isosorbide mononitrate, sitaGLIPtin, sertraline, HYDROcodone-acetaminophen, ALPRAZolam, and NOVOLIN 70/30 RELION.  No orders of the defined types were placed in this encounter.    Follow-up: Return in about 1 week (around 02/18/2016).  Claretta Fraise, M.D.

## 2016-02-12 LAB — URINE CULTURE

## 2016-02-12 LAB — D-DIMER, QUANTITATIVE (NOT AT ARMC): D-DIMER: 0.87 mg/L FEU — ABNORMAL HIGH (ref 0.00–0.49)

## 2016-02-13 ENCOUNTER — Other Ambulatory Visit: Payer: Medicare Other

## 2016-02-13 ENCOUNTER — Telehealth: Payer: Self-pay | Admitting: *Deleted

## 2016-02-13 DIAGNOSIS — E1142 Type 2 diabetes mellitus with diabetic polyneuropathy: Secondary | ICD-10-CM

## 2016-02-13 DIAGNOSIS — Z794 Long term (current) use of insulin: Principal | ICD-10-CM

## 2016-02-13 DIAGNOSIS — R519 Headache, unspecified: Secondary | ICD-10-CM

## 2016-02-13 DIAGNOSIS — R51 Headache: Secondary | ICD-10-CM

## 2016-02-13 DIAGNOSIS — I10 Essential (primary) hypertension: Secondary | ICD-10-CM

## 2016-02-13 DIAGNOSIS — E118 Type 2 diabetes mellitus with unspecified complications: Secondary | ICD-10-CM | POA: Diagnosis not present

## 2016-02-13 LAB — CBC WITH DIFFERENTIAL/PLATELET
Basophils Absolute: 0.1 10*3/uL (ref 0.0–0.2)
Basos: 1 %
EOS (ABSOLUTE): 0.1 10*3/uL (ref 0.0–0.4)
Eos: 1 %
Hematocrit: 46.6 % (ref 37.5–51.0)
Hemoglobin: 16.4 g/dL (ref 12.6–17.7)
Immature Grans (Abs): 0.1 10*3/uL (ref 0.0–0.1)
Immature Granulocytes: 1 %
Lymphocytes Absolute: 1.9 10*3/uL (ref 0.7–3.1)
Lymphs: 21 %
MCH: 29 pg (ref 26.6–33.0)
MCHC: 35.2 g/dL (ref 31.5–35.7)
MCV: 82 fL (ref 79–97)
Monocytes Absolute: 0.8 10*3/uL (ref 0.1–0.9)
Monocytes: 8 %
Neutrophils Absolute: 6.5 10*3/uL (ref 1.4–7.0)
Neutrophils: 68 %
Platelets: 274 10*3/uL (ref 150–379)
RBC: 5.66 x10E6/uL (ref 4.14–5.80)
RDW: 13.4 % (ref 12.3–15.4)
WBC: 9.4 10*3/uL (ref 3.4–10.8)

## 2016-02-13 LAB — CMP14+EGFR
ALT: 22 IU/L (ref 0–44)
AST: 17 IU/L (ref 0–40)
Albumin/Globulin Ratio: 1.4 (ref 1.2–2.2)
Albumin: 4.5 g/dL (ref 3.6–4.8)
Alkaline Phosphatase: 70 IU/L (ref 39–117)
BUN/Creatinine Ratio: 18 (ref 10–24)
BUN: 19 mg/dL (ref 8–27)
Bilirubin Total: 0.6 mg/dL (ref 0.0–1.2)
CO2: 26 mmol/L (ref 18–29)
Calcium: 10.8 mg/dL — ABNORMAL HIGH (ref 8.6–10.2)
Chloride: 95 mmol/L — ABNORMAL LOW (ref 96–106)
Creatinine, Ser: 1.08 mg/dL (ref 0.76–1.27)
GFR calc Af Amer: 83 mL/min/{1.73_m2} (ref >59)
GFR calc non Af Amer: 72 mL/min/{1.73_m2} (ref >59)
Globulin, Total: 3.2 g/dL (ref 1.5–4.5)
Glucose: 123 mg/dL — ABNORMAL HIGH (ref 65–99)
Potassium: 5 mmol/L (ref 3.5–5.2)
Sodium: 136 mmol/L (ref 134–144)
Total Protein: 7.7 g/dL (ref 6.0–8.5)

## 2016-02-13 LAB — SEDIMENTATION RATE: Sed Rate: 2 mm/hr (ref 0–30)

## 2016-02-13 LAB — LYME AB/WESTERN BLOT REFLEX
LYME DISEASE AB, QUANT, IGM: <0.8 index (ref 0.00–0.79)
Lyme IgG/IgM Ab: <0.91 {ISR} (ref 0.00–0.90)

## 2016-02-13 LAB — ROCKY MTN SPOTTED FVR ABS PNL(IGG+IGM)
RMSF IgG: NEGATIVE
RMSF IgM: 0.55 index (ref 0.00–0.89)

## 2016-02-13 LAB — TSH: TSH: 2.84 u[IU]/mL (ref 0.450–4.500)

## 2016-02-13 NOTE — Addendum Note (Signed)
Addended by: Wardell Heath on: 02/13/2016 11:03 AM   Modules accepted: Orders

## 2016-02-13 NOTE — Telephone Encounter (Signed)
Patient aware that he will need to come back in and have more labs drawn

## 2016-02-14 LAB — PARATHYROID HORMONE, INTACT (NO CA): PTH: 23 pg/mL (ref 15–65)

## 2016-02-19 ENCOUNTER — Encounter: Payer: Self-pay | Admitting: Family Medicine

## 2016-02-19 ENCOUNTER — Ambulatory Visit (INDEPENDENT_AMBULATORY_CARE_PROVIDER_SITE_OTHER): Payer: Medicare Other | Admitting: Family Medicine

## 2016-02-19 VITALS — BP 150/89 | HR 70 | Temp 97.0°F | Ht 74.0 in | Wt 278.0 lb

## 2016-02-19 DIAGNOSIS — E291 Testicular hypofunction: Secondary | ICD-10-CM

## 2016-02-19 DIAGNOSIS — E1142 Type 2 diabetes mellitus with diabetic polyneuropathy: Secondary | ICD-10-CM

## 2016-02-19 DIAGNOSIS — K219 Gastro-esophageal reflux disease without esophagitis: Secondary | ICD-10-CM | POA: Diagnosis not present

## 2016-02-19 DIAGNOSIS — I10 Essential (primary) hypertension: Secondary | ICD-10-CM | POA: Diagnosis not present

## 2016-02-19 DIAGNOSIS — R7989 Other specified abnormal findings of blood chemistry: Secondary | ICD-10-CM

## 2016-02-19 LAB — BAYER DCA HB A1C WAIVED: HB A1C (BAYER DCA - WAIVED): 7.7 % — ABNORMAL HIGH (ref <7.0)

## 2016-02-19 NOTE — Progress Notes (Signed)
Subjective:  Patient ID: [name] [name], male    DOB: [birth year]  Age: 65 y.o. MRN: [id number]  CC: Diabetes (one week recheck) and Symptoms like lymes disease (recheck)   HPI [name] [name] presents for Recheck of previous symptoms and follow-up on his diabetes. His muscle and joint pains plus his fatigue etc. have resolved completely. It only took 3-4 days after starting his treatment last time. We reviewed his blood work showing no sign of Lyme's or [place] spotted fever etc.  Follow-up of diabetes. Patient does  check blood sugar at home area he says that it seems to gone up slightly with the prednisone but that is all. He is following a diabetic diet. He does not exercise regularly. Patient denies symptoms such as polyuria, polydipsia, excessive hunger, nausea No significant hypoglycemic spells noted. Medications as noted below. Taking them regularly without complication/adverse reaction being reported today.      History [name] [name] has a past medical history of AODM; Arthritis; CAD (coronary artery disease); Cellulitis and abscess rt groin; CHEST PAIN-UNSPECIFIED; Diabetes mellitus; Disorders of iron metabolism; DISORDERS OF IRON METABOLISM; GERD (gastroesophageal reflux disease); Hyperlipidemia; Hypertension; Low serum testosterone level; Medically noncompliant; and MURMUR.   He has a past surgical history that includes Coronary stent placement (2000); Hernia repair (2000); Back surgery (2015); Lesion removal; Esophagogastroduodenoscopy; Colonoscopy (N/A, 10/01/2014); Esophagogastroduodenoscopy (N/A, 10/01/2014); and Neck surgery.   His family history includes Alzheimer's disease in his mother; Arthritis in his father, mother, and sister; Cancer in his mother and paternal uncle; Dementia in his maternal aunt and maternal uncle; Diabetes in his father and sister; Heart disease in his father and maternal uncle; Hyperlipidemia in his mother and sister; Hypertension in his mother and sister;  Valvular heart disease in his father.He reports that he has been smoking Cigarettes.  He has a 12.75 pack-year smoking history. He has never used smokeless tobacco. He reports that he does not drink alcohol or use drugs.    ROS Review of Systems  Constitutional: Negative for chills, diaphoresis, fever and unexpected weight change.  HENT: Negative for congestion, hearing loss, rhinorrhea and sore throat.   Eyes: Negative for visual disturbance.  Respiratory: Negative for cough and shortness of breath.   Cardiovascular: Negative for chest pain.  Gastrointestinal: Negative for abdominal pain, constipation and diarrhea.  Genitourinary: Negative for dysuria and flank pain.  Musculoskeletal: Negative for arthralgias and joint swelling.  Skin: Negative for rash.  Neurological: Negative for dizziness and headaches.  Psychiatric/Behavioral: Negative for dysphoric mood and sleep disturbance.    Objective:  BP (!) 150/89   Pulse 70   Temp 97 F (36.1 C) (Oral)   Ht 6\' 2"  (1.88 m)   Wt 278 lb (126.1 kg)   BMI 35.69 kg/m   BP Readings from Last 3 Encounters:  02/19/16 (!) 150/89  02/11/16 (!) 167/81  01/13/16 127/65    Wt Readings from Last 3 Encounters:  02/19/16 278 lb (126.1 kg)  02/11/16 283 lb 6 oz (128.5 kg)  01/13/16 281 lb 6.4 oz (127.6 kg)     Physical Exam  Constitutional: He is oriented to person, place, and time. He appears well-developed and well-nourished. No distress.  HENT:  Head: Normocephalic and atraumatic.  Right Ear: External ear normal.  Left Ear: External ear normal.  Nose: Nose normal.  Mouth/Throat: Oropharynx is clear and moist.  Eyes: Conjunctivae and EOM are normal. Pupils are equal, round, and reactive to light.  Neck: Normal range of motion. Neck supple. No  thyromegaly present.  Cardiovascular: Normal rate, regular rhythm and normal heart sounds.   No murmur heard. Pulmonary/Chest: Effort normal and breath sounds normal. No respiratory distress.  He has no wheezes. He has no rales.  Abdominal: Soft. Bowel sounds are normal. He exhibits no distension. There is no tenderness.  Lymphadenopathy:    He has no cervical adenopathy.  Neurological: He is alert and oriented to person, place, and time. He has normal reflexes.  Skin: Skin is warm and dry.  Psychiatric: He has a normal mood and affect. His behavior is normal. Judgment and thought content normal.     Lab Results  Component Value Date   WBC 9.4 02/11/2016   HGB 15.9 08/13/2014   HCT 46.6 02/11/2016   PLT 274 02/11/2016   GLUCOSE 123 (H) 02/11/2016   CHOL 210 (H) 09/17/2015   TRIG 260 (H) 09/17/2015   HDL 24 (L) 09/17/2015   LDLDIRECT 172 (H) 11/08/2014   LDLCALC 134 (H) 09/17/2015   ALT 22 02/11/2016   AST 17 02/11/2016   NA 136 02/11/2016   K 5.0 02/11/2016   CL 95 (L) 02/11/2016   CREATININE 1.08 02/11/2016   BUN 19 02/11/2016   CO2 26 02/11/2016   TSH 2.840 02/11/2016   PSA 1.1 05/31/2013   INR 0.99 10/25/2011   HGBA1C 7.4 06/13/2015   MICROALBUR 100 05/31/2013    Mr Lumbar Spine Wo Contrast  Result Date: 12/27/2014 CLINICAL DATA:  Lumbar spondylosis.  Left leg pain and numbness EXAM: MRI LUMBAR SPINE WITHOUT CONTRAST TECHNIQUE: Multiplanar, multisequence MR imaging of the lumbar spine was performed. No intravenous contrast was administered. COMPARISON:  Lumbar MRI 12/16/2010 FINDINGS: Grade 1 anterior slip L4-5 is similar to the prior study. Negative for fracture or mass. Conus medullaris normal and terminates at L1-2 L1-2:  Mild facet degeneration without stenosis. L2-3: Small left paracentral disc protrusion has developed since the prior study. This appears to be causing impingement of the left L3 nerve root in the subarticular recess. There is bilateral facet hypertrophy and mild spinal stenosis at this level. L3-4: Mild retrolisthesis. Diffuse disc bulging. Moderate facet hypertrophy. Moderate spinal stenosis similar to the prior study. Left foraminal  encroachment again noted with impingement of the left L3 nerve root in the foramen due to disc bulging and facet hypertrophy. L4-5: 6 mm anterior slip. Advanced facet degeneration. Diffuse disc bulging. Mild foraminal narrowing bilaterally. L5-S1: Disc degeneration and facet degeneration. Moderate right foraminal encroachment. IMPRESSION: Interval development of small left paracentral disc protrusion at L2-3 with expected impingement of left L3 nerve root in the subarticular recess. There is severe left foraminal encroachment at L3-4 also causing L3 nerve root impingement. This is similar to the prior study Moderate spinal stenosis at L3-4 Grade 1 anterior slip L4-5 related to facet degeneration. There is mild foraminal narrowing bilaterally at L4-5. Electronically Signed   By: Franchot Gallo M.D.   On: 12/27/2014 20:43    Assessment & Plan:   [name] [name] was seen today for diabetes and symptoms like lymes disease.  Diagnoses and all orders for this visit:  Diabetic polyneuropathy associated with type 2 diabetes mellitus ([place]) -     Bayer DCA Hb A1c Waived -     Lipid panel -     Microalbumin / creatinine urine ratio -     Testosterone,Free and Total  Essential hypertension, benign  Gastroesophageal reflux disease without esophagitis  Low serum testosterone level -     Testosterone,Free and Total  I am having [name] [name] maintain his Lactobacillus (PROBIOTIC ACIDOPHILUS PO), Red Yeast Rice Extract (RED YEAST RICE PO), Pen Needles, mometasone, furosemide, gabapentin, meloxicam, metoprolol tartrate, omeprazole, telmisartan, insulin NPH-regular Human, aspirin, isosorbide mononitrate, sitaGLIPtin, sertraline, HYDROcodone-acetaminophen, ALPRAZolam, NOVOLIN 70/30 RELION, doxycycline, and predniSONE.  No orders of the defined types were placed in this encounter.    Follow-up: Return in about 3 months (around 05/21/2016).  Claretta Fraise, M.D.

## 2016-02-20 ENCOUNTER — Other Ambulatory Visit: Payer: Self-pay | Admitting: Family Medicine

## 2016-02-20 MED ORDER — GEMFIBROZIL 600 MG PO TABS: 600.0000 mg | ORAL_TABLET | Freq: Two times a day (BID) | ORAL | 2 refills | Status: DC

## 2016-02-21 LAB — TESTOSTERONE,FREE AND TOTAL
Testosterone, Free: 4.2 pg/mL — ABNORMAL LOW (ref 6.6–18.1)
Testosterone: 232 ng/dL — ABNORMAL LOW (ref 264–916)

## 2016-02-21 LAB — LIPID PANEL
Chol/HDL Ratio: 8.5 ratio units — ABNORMAL HIGH (ref 0.0–5.0)
Cholesterol, Total: 239 mg/dL — ABNORMAL HIGH (ref 100–199)
HDL: 28 mg/dL — ABNORMAL LOW (ref >39)
Triglycerides: 647 mg/dL (ref 0–149)

## 2016-02-24 DIAGNOSIS — Z79891 Long term (current) use of opiate analgesic: Secondary | ICD-10-CM | POA: Diagnosis not present

## 2016-02-24 DIAGNOSIS — M5136 Other intervertebral disc degeneration, lumbar region: Secondary | ICD-10-CM | POA: Diagnosis not present

## 2016-02-24 DIAGNOSIS — M47817 Spondylosis without myelopathy or radiculopathy, lumbosacral region: Secondary | ICD-10-CM | POA: Diagnosis not present

## 2016-02-24 DIAGNOSIS — G8929 Other chronic pain: Secondary | ICD-10-CM | POA: Diagnosis not present

## 2016-02-26 ENCOUNTER — Encounter: Payer: Self-pay | Admitting: Pharmacist

## 2016-02-26 ENCOUNTER — Ambulatory Visit (INDEPENDENT_AMBULATORY_CARE_PROVIDER_SITE_OTHER): Payer: Medicare Other | Admitting: Pharmacist

## 2016-02-26 VITALS — BP 142/78 | HR 65 | Ht 74.0 in | Wt 283.0 lb

## 2016-02-26 DIAGNOSIS — Z23 Encounter for immunization: Secondary | ICD-10-CM

## 2016-02-26 DIAGNOSIS — Z794 Long term (current) use of insulin: Secondary | ICD-10-CM

## 2016-02-26 DIAGNOSIS — E118 Type 2 diabetes mellitus with unspecified complications: Secondary | ICD-10-CM

## 2016-02-26 DIAGNOSIS — IMO0001 Reserved for inherently not codable concepts without codable children: Secondary | ICD-10-CM

## 2016-02-26 DIAGNOSIS — Z6835 Body mass index (BMI) 35.0-35.9, adult: Secondary | ICD-10-CM

## 2016-02-26 DIAGNOSIS — E669 Obesity, unspecified: Secondary | ICD-10-CM

## 2016-02-26 NOTE — Progress Notes (Signed)
Patient ID: [name] [name], male   DOB: December 17, 1950, 65 y.o.   MRN: [id number]    Subjective:    [name] [name] is a 65 y.o. male who presents for follow-up of Type 2 diabetes mellitus.  Current symptoms/problems include hyperglycemia, hypoglycemia  and polyuria and have been variable since our last visit about 3 months ago.  He has recently has a course of oral prednisone which cause BG to increase significantly.     Known diabetic complications: peripheral neuropathy and cardiovascular disease Cardiovascular risk factors: advanced age (older than 74 for men, 34 for women), diabetes mellitus, dyslipidemia, hypertension, male gender, obesity (BMI >= 30 kg/m2), sedentary lifestyle and smoking/ tobacco exposure   Current diabetic medications include  Novolog 70/30 mix insulin 65 - 75 units bid and januvia 100mg  1 tablet daily.   Eye exam current (within one year): no - last appt with Dr Rona Ravens 12/25/2014 Weight trend: increasing steadily Current diet: in general, an "unhealthy" diet; he has been eating more due to increase appetite from prednisone.  He knows which foods increase BG the most and limits potatoes, bread and other starches.   Current exercise: none  Current monitoring regimen:  Checks 2-3 times daily.  Ranges from 120's to over 300 -  Patient reported  Home blood sugar records: patient did not bring record Any episodes of hypoglycemia? No per patient   Is He on ACE inhibitor or angiotensin II receptor blocker?  Yes telmisartan 40mg  qd   The following portions of the patient's history were reviewed and updated as appropriate: allergies, current medications, past family history, past medical history, past social history, past surgical history and problem list.  Objective:    BP (!) 142/78   Pulse 65   Ht 6\' 2"  (1.88 m)   Wt 283 lb (128.4 kg)   BMI 36.34 kg/m   A1c = 7.7% (02/19/2016) A1c = 6.5% (09/2015)  Lab Review Glucose (mg/dL)  Date Value  02/11/2016 123 (H)   09/17/2015 127 (H)  06/13/2015 163 (H)   Glucose, Bld (mg/dL)  Date Value  07/30/2014 228 (H)  08/09/2013 279 (H)  08/08/2013 142 (H)   CO2 (mmol/L)  Date Value  02/11/2016 26  09/17/2015 27  06/13/2015 23   BUN (mg/dL)  Date Value  02/11/2016 19  09/17/2015 25  06/13/2015 26   Creatinine, Ser (mg/dL)  Date Value  02/11/2016 1.08  09/17/2015 1.06  06/13/2015 1.13     Assessment:    Diabetes Mellitus type II, under variable control and complicated by steroid therapy which he has completed.  HTN - not at goal  Obetsity - increased weight due to prednisone Hyperlipidemia - has refused statins due to h/o myalgias; has not started gemfibrozil yet  Plan:    1.  Rx changes:   Continue Novolog Mix 70/30 - injection 75 unit BID  Also encouraged him to use Novolog / Humalog insulin to inject 10 units as needed for BG over 250.  He is to call office if he has to use Novolog more than 3 times per week 2.  Discussed CMG with patient.  He did not want to do at this time but will reconsider if BG continues to be variable. 3.  Education: Reviewed 'ABCs' of diabetes management (respective goals in parentheses):  A1C (<7), blood pressure (<130/80), and cholesterol (LDL <100). 4.  Reviewed CHO counting diet.    Reviewed patient's dietary choices.   5.  Discussed smoking cessation - reminded patient that slips  happen and that just because he has cigarette when around family, he should no purchase a pack of cigarettes.  Work on Stage manager or alternatives when faced with trigger. 6.  Influenza vaccines - discussed ADA recommendations and benefits to patient.  He has agreed to get influenza vaccine this year! 7.  Reminded to make appt to have diabetic eye exam  Orders Placed This Encounter  Procedures  . Flu Vaccine QUAD 36+ mos IM    6.  Follow up: with PCP as planned in January 2018.  To call office to see me if BG is not improving within the next 2 weeks.  Cherre Robins,  PharmD, CPP, CDE

## 2016-03-02 ENCOUNTER — Telehealth: Payer: Self-pay | Admitting: Family Medicine

## 2016-03-02 NOTE — Telephone Encounter (Signed)
Pt notified we don't get samples of Micardis

## 2016-03-06 ENCOUNTER — Ambulatory Visit (INDEPENDENT_AMBULATORY_CARE_PROVIDER_SITE_OTHER): Payer: Medicare Other

## 2016-03-06 ENCOUNTER — Encounter: Payer: Self-pay | Admitting: Family Medicine

## 2016-03-06 ENCOUNTER — Ambulatory Visit (INDEPENDENT_AMBULATORY_CARE_PROVIDER_SITE_OTHER): Payer: Medicare Other | Admitting: Family Medicine

## 2016-03-06 VITALS — BP 137/80 | HR 71 | Temp 97.6°F | Ht 74.0 in | Wt 283.4 lb

## 2016-03-06 DIAGNOSIS — M1711 Unilateral primary osteoarthritis, right knee: Secondary | ICD-10-CM | POA: Diagnosis not present

## 2016-03-06 DIAGNOSIS — M25561 Pain in right knee: Secondary | ICD-10-CM | POA: Diagnosis not present

## 2016-03-06 MED ORDER — METHYLPREDNISOLONE ACETATE 80 MG/ML IJ SUSP
80.0000 mg | Freq: Once | INTRAMUSCULAR | Status: AC
  Administered 2016-03-06: 80 mg via INTRAMUSCULAR

## 2016-03-06 NOTE — Progress Notes (Signed)
BP 137/80   Pulse 71   Temp 97.6 F (36.4 C) (Oral)   Ht 6\' 2"  (1.88 m)   Wt 283 lb 6 oz (128.5 kg)   BMI 36.38 kg/m    Subjective:    Patient ID: [name] [name], male    DOB: 1951/03/05, 65 y.o.   MRN: [id number]  HPI: [name] [name] is a 65 y.o. male presenting on 03/06/2016 for Right knee pain (ongoing for several years, had cortisone shot about 5 years ago)   HPI Right knee pain Patient is having right medial knee pain as he has had this previously. He has had injections in his knee previously which helped for quite some time. His last injection was 5 years ago. He says over the past week to 2 weeks he has started having more pain in that knee especially on the medial side and is affecting his ability to ambulate more. He would like to see if he get a knee injection again. He denies any fevers or chills or redness or warmth. He denies any popping or catching or giving way of the knee.  Relevant past medical, surgical, family and social history reviewed and updated as indicated. Interim medical history since our last visit reviewed. Allergies and medications reviewed and updated.  Review of Systems  Constitutional: Negative for chills and fever.  Eyes: Negative for discharge.  Respiratory: Negative for shortness of breath and wheezing.   Cardiovascular: Negative for chest pain and leg swelling.  Musculoskeletal: Positive for arthralgias. Negative for back pain, gait problem and joint swelling.  Skin: Negative for color change and rash.  All other systems reviewed and are negative.   Per HPI unless specifically indicated above     Objective:    BP 137/80   Pulse 71   Temp 97.6 F (36.4 C) (Oral)   Ht 6\' 2"  (1.88 m)   Wt 283 lb 6 oz (128.5 kg)   BMI 36.38 kg/m   Wt Readings from Last 3 Encounters:  03/06/16 283 lb 6 oz (128.5 kg)  02/26/16 283 lb (128.4 kg)  02/19/16 278 lb (126.1 kg)    Physical Exam  Constitutional: He is oriented to person, place, and time. He  appears well-developed and well-nourished. No distress.  Eyes: Conjunctivae are normal. Right eye exhibits no discharge. No scleral icterus.  Musculoskeletal: Normal range of motion. He exhibits no edema.       Right knee: He exhibits normal range of motion, no swelling, no effusion, no ecchymosis, no deformity, no erythema, normal alignment, no LCL laxity, normal patellar mobility, normal meniscus and no MCL laxity. Tenderness found. Medial joint line tenderness noted.  Neurological: He is alert and oriented to person, place, and time. Coordination normal.  Skin: Skin is warm and dry. No rash noted. He is not diaphoretic.  Psychiatric: He has a normal mood and affect. His behavior is normal.  Nursing note and vitals reviewed.  Knee injection: Risk factors of bleeding and infection discussed with patient and patient is agreeable towards injection. Patient prepped with Betadine. Lateral approach towards injection used. Injected 80 mg of Depo-Medrol and 1 mL of 2% lidocaine. Patient tolerated procedure well and no side effects from noted. Minimal to no bleeding. Simple bandage applied after.     Assessment & Plan:   Problem List Items Addressed This Visit    None    Visit Diagnoses    Osteoarthritis of right knee, unspecified osteoarthritis type    -  Primary  Relevant Medications   methylPREDNISolone acetate (DEPO-MEDROL) injection 80 mg (Start on 03/06/2016  1:45 PM)   Other Relevant Orders   DG Knee 1-2 Views Right       Follow up plan: Return if symptoms worsen or fail to improve.  Counseling provided for all of the vaccine components Orders Placed This Encounter  Procedures  . DG Knee 1-2 Views Right    Caryl Pina, MD [facility] 03/06/2016, 1:32 PM

## 2016-03-11 DIAGNOSIS — E119 Type 2 diabetes mellitus without complications: Secondary | ICD-10-CM | POA: Diagnosis not present

## 2016-03-11 DIAGNOSIS — Z7984 Long term (current) use of oral hypoglycemic drugs: Secondary | ICD-10-CM | POA: Diagnosis not present

## 2016-03-11 DIAGNOSIS — E113291 Type 2 diabetes mellitus with mild nonproliferative diabetic retinopathy without macular edema, right eye: Secondary | ICD-10-CM | POA: Diagnosis not present

## 2016-03-11 DIAGNOSIS — Z794 Long term (current) use of insulin: Secondary | ICD-10-CM | POA: Diagnosis not present

## 2016-03-11 LAB — HM DIABETES EYE EXAM

## 2016-03-23 ENCOUNTER — Telehealth: Payer: Self-pay | Admitting: Family Medicine

## 2016-03-23 DIAGNOSIS — M47817 Spondylosis without myelopathy or radiculopathy, lumbosacral region: Secondary | ICD-10-CM | POA: Diagnosis not present

## 2016-03-23 DIAGNOSIS — M5136 Other intervertebral disc degeneration, lumbar region: Secondary | ICD-10-CM | POA: Diagnosis not present

## 2016-03-23 MED ORDER — SITAGLIPTIN PHOSPHATE 100 MG PO TABS: 100.0000 mg | ORAL_TABLET | Freq: Every day | ORAL | 0 refills | Status: DC

## 2016-03-23 NOTE — Telephone Encounter (Signed)
#  35 samples left at front desk for patient.  LM on VM

## 2016-03-29 ENCOUNTER — Other Ambulatory Visit: Payer: Self-pay | Admitting: Family Medicine

## 2016-03-30 NOTE — Telephone Encounter (Signed)
Go ahead and call in This refill, patient will need to see Dr. Livia Snellen prior to next refills

## 2016-03-30 NOTE — Telephone Encounter (Signed)
Rx called in to pharmacy and pt aware. 

## 2016-04-08 DIAGNOSIS — E1142 Type 2 diabetes mellitus with diabetic polyneuropathy: Secondary | ICD-10-CM | POA: Diagnosis not present

## 2016-04-20 DIAGNOSIS — E1142 Type 2 diabetes mellitus with diabetic polyneuropathy: Secondary | ICD-10-CM | POA: Diagnosis not present

## 2016-04-20 DIAGNOSIS — M47817 Spondylosis without myelopathy or radiculopathy, lumbosacral region: Secondary | ICD-10-CM | POA: Diagnosis not present

## 2016-04-20 DIAGNOSIS — M5136 Other intervertebral disc degeneration, lumbar region: Secondary | ICD-10-CM | POA: Diagnosis not present

## 2016-04-20 DIAGNOSIS — G8929 Other chronic pain: Secondary | ICD-10-CM | POA: Diagnosis not present

## 2016-04-20 DIAGNOSIS — Z79891 Long term (current) use of opiate analgesic: Secondary | ICD-10-CM | POA: Diagnosis not present

## 2016-05-03 ENCOUNTER — Other Ambulatory Visit: Payer: Self-pay | Admitting: Family Medicine

## 2016-05-04 ENCOUNTER — Telehealth: Payer: Self-pay | Admitting: Pharmacist

## 2016-05-04 NOTE — Telephone Encounter (Signed)
No samples of Januvia currently. I have 30 day free coupon that patient can use if he has not used one this year yet.  Left message for patient on VM.

## 2016-05-14 ENCOUNTER — Other Ambulatory Visit: Payer: Self-pay | Admitting: Nurse Practitioner

## 2016-05-14 DIAGNOSIS — M5126 Other intervertebral disc displacement, lumbar region: Secondary | ICD-10-CM

## 2016-05-14 DIAGNOSIS — M47816 Spondylosis without myelopathy or radiculopathy, lumbar region: Secondary | ICD-10-CM

## 2016-05-21 ENCOUNTER — Ambulatory Visit: Payer: Medicare Other | Admitting: Pediatrics

## 2016-05-21 ENCOUNTER — Emergency Department (HOSPITAL_COMMUNITY): Payer: Medicare Other

## 2016-05-21 ENCOUNTER — Encounter (HOSPITAL_COMMUNITY): Payer: Self-pay | Admitting: Emergency Medicine

## 2016-05-21 ENCOUNTER — Telehealth: Payer: Self-pay | Admitting: *Deleted

## 2016-05-21 ENCOUNTER — Observation Stay (HOSPITAL_COMMUNITY): Payer: Medicare Other

## 2016-05-21 ENCOUNTER — Inpatient Hospital Stay (HOSPITAL_COMMUNITY)
Admission: EM | Admit: 2016-05-21 | Discharge: 2016-05-25 | DRG: 261 | Disposition: A | Discharge status: Home / Self Care | Payer: Medicare Other | Attending: Internal Medicine | Admitting: Internal Medicine

## 2016-05-21 DIAGNOSIS — Z801 Family history of malignant neoplasm of trachea, bronchus and lung: Secondary | ICD-10-CM

## 2016-05-21 DIAGNOSIS — E119 Type 2 diabetes mellitus without complications: Secondary | ICD-10-CM

## 2016-05-21 DIAGNOSIS — R55 Syncope and collapse: Secondary | ICD-10-CM | POA: Diagnosis not present

## 2016-05-21 DIAGNOSIS — F419 Anxiety disorder, unspecified: Secondary | ICD-10-CM | POA: Diagnosis present

## 2016-05-21 DIAGNOSIS — I6522 Occlusion and stenosis of left carotid artery: Secondary | ICD-10-CM | POA: Diagnosis present

## 2016-05-21 DIAGNOSIS — N179 Acute kidney failure, unspecified: Secondary | ICD-10-CM | POA: Diagnosis not present

## 2016-05-21 DIAGNOSIS — F17210 Nicotine dependence, cigarettes, uncomplicated: Secondary | ICD-10-CM | POA: Diagnosis present

## 2016-05-21 DIAGNOSIS — Z981 Arthrodesis status: Secondary | ICD-10-CM

## 2016-05-21 DIAGNOSIS — J40 Bronchitis, not specified as acute or chronic: Secondary | ICD-10-CM | POA: Diagnosis not present

## 2016-05-21 DIAGNOSIS — Z9861 Coronary angioplasty status: Secondary | ICD-10-CM

## 2016-05-21 DIAGNOSIS — Z955 Presence of coronary angioplasty implant and graft: Secondary | ICD-10-CM

## 2016-05-21 DIAGNOSIS — I493 Ventricular premature depolarization: Secondary | ICD-10-CM | POA: Diagnosis not present

## 2016-05-21 DIAGNOSIS — I6502 Occlusion and stenosis of left vertebral artery: Secondary | ICD-10-CM | POA: Diagnosis not present

## 2016-05-21 DIAGNOSIS — E785 Hyperlipidemia, unspecified: Secondary | ICD-10-CM | POA: Diagnosis not present

## 2016-05-21 DIAGNOSIS — Z7982 Long term (current) use of aspirin: Secondary | ICD-10-CM

## 2016-05-21 DIAGNOSIS — I152 Hypertension secondary to endocrine disorders: Secondary | ICD-10-CM | POA: Diagnosis present

## 2016-05-21 DIAGNOSIS — E1159 Type 2 diabetes mellitus with other circulatory complications: Secondary | ICD-10-CM | POA: Diagnosis present

## 2016-05-21 DIAGNOSIS — Z8249 Family history of ischemic heart disease and other diseases of the circulatory system: Secondary | ICD-10-CM

## 2016-05-21 DIAGNOSIS — I2510 Atherosclerotic heart disease of native coronary artery without angina pectoris: Secondary | ICD-10-CM

## 2016-05-21 DIAGNOSIS — I119 Hypertensive heart disease without heart failure: Secondary | ICD-10-CM | POA: Diagnosis present

## 2016-05-21 DIAGNOSIS — Z66 Do not resuscitate: Secondary | ICD-10-CM | POA: Diagnosis not present

## 2016-05-21 DIAGNOSIS — Z9119 Patient's noncompliance with other medical treatment and regimen: Secondary | ICD-10-CM

## 2016-05-21 DIAGNOSIS — Z800 Family history of malignant neoplasm of digestive organs: Secondary | ICD-10-CM

## 2016-05-21 DIAGNOSIS — R778 Other specified abnormalities of plasma proteins: Secondary | ICD-10-CM

## 2016-05-21 DIAGNOSIS — Z91048 Other nonmedicinal substance allergy status: Secondary | ICD-10-CM

## 2016-05-21 DIAGNOSIS — K219 Gastro-esophageal reflux disease without esophagitis: Secondary | ICD-10-CM | POA: Diagnosis not present

## 2016-05-21 DIAGNOSIS — Z87892 Personal history of anaphylaxis: Secondary | ICD-10-CM

## 2016-05-21 DIAGNOSIS — Z91041 Radiographic dye allergy status: Secondary | ICD-10-CM | POA: Diagnosis not present

## 2016-05-21 DIAGNOSIS — Z91013 Allergy to seafood: Secondary | ICD-10-CM

## 2016-05-21 DIAGNOSIS — Z794 Long term (current) use of insulin: Secondary | ICD-10-CM

## 2016-05-21 DIAGNOSIS — F329 Major depressive disorder, single episode, unspecified: Secondary | ICD-10-CM | POA: Diagnosis not present

## 2016-05-21 DIAGNOSIS — Z888 Allergy status to other drugs, medicaments and biological substances status: Secondary | ICD-10-CM

## 2016-05-21 DIAGNOSIS — I771 Stricture of artery: Secondary | ICD-10-CM

## 2016-05-21 DIAGNOSIS — I6509 Occlusion and stenosis of unspecified vertebral artery: Secondary | ICD-10-CM

## 2016-05-21 DIAGNOSIS — E860 Dehydration: Secondary | ICD-10-CM | POA: Diagnosis present

## 2016-05-21 DIAGNOSIS — F32A Depression, unspecified: Secondary | ICD-10-CM | POA: Diagnosis present

## 2016-05-21 DIAGNOSIS — E1142 Type 2 diabetes mellitus with diabetic polyneuropathy: Secondary | ICD-10-CM

## 2016-05-21 DIAGNOSIS — I6523 Occlusion and stenosis of bilateral carotid arteries: Secondary | ICD-10-CM | POA: Diagnosis not present

## 2016-05-21 DIAGNOSIS — I10 Essential (primary) hypertension: Secondary | ICD-10-CM

## 2016-05-21 DIAGNOSIS — E1140 Type 2 diabetes mellitus with diabetic neuropathy, unspecified: Secondary | ICD-10-CM | POA: Diagnosis present

## 2016-05-21 DIAGNOSIS — E781 Pure hyperglyceridemia: Secondary | ICD-10-CM | POA: Diagnosis present

## 2016-05-21 DIAGNOSIS — Z8673 Personal history of transient ischemic attack (TIA), and cerebral infarction without residual deficits: Secondary | ICD-10-CM

## 2016-05-21 DIAGNOSIS — R7989 Other specified abnormal findings of blood chemistry: Secondary | ICD-10-CM

## 2016-05-21 DIAGNOSIS — E669 Obesity, unspecified: Secondary | ICD-10-CM | POA: Diagnosis present

## 2016-05-21 DIAGNOSIS — IMO0002 Reserved for concepts with insufficient information to code with codable children: Secondary | ICD-10-CM

## 2016-05-21 DIAGNOSIS — R220 Localized swelling, mass and lump, head: Secondary | ICD-10-CM | POA: Diagnosis not present

## 2016-05-21 DIAGNOSIS — K760 Fatty (change of) liver, not elsewhere classified: Secondary | ICD-10-CM | POA: Diagnosis present

## 2016-05-21 DIAGNOSIS — R51 Headache: Secondary | ICD-10-CM

## 2016-05-21 DIAGNOSIS — Z6835 Body mass index (BMI) 35.0-35.9, adult: Secondary | ICD-10-CM

## 2016-05-21 DIAGNOSIS — R519 Headache, unspecified: Secondary | ICD-10-CM | POA: Diagnosis present

## 2016-05-21 DIAGNOSIS — T671XXA Heat syncope, initial encounter: Secondary | ICD-10-CM

## 2016-05-21 DIAGNOSIS — E1169 Type 2 diabetes mellitus with other specified complication: Secondary | ICD-10-CM | POA: Diagnosis present

## 2016-05-21 DIAGNOSIS — Z833 Family history of diabetes mellitus: Secondary | ICD-10-CM

## 2016-05-21 LAB — CBC WITH DIFFERENTIAL/PLATELET
Basophils Absolute: 0.1 10*3/uL (ref 0.0–0.1)
Basophils Relative: 1 %
Eosinophils Absolute: 0.2 10*3/uL (ref 0.0–0.7)
Eosinophils Relative: 2 %
HCT: 47.2 % (ref 39.0–52.0)
Hemoglobin: 15.8 g/dL (ref 13.0–17.0)
Lymphocytes Relative: 25 %
Lymphs Abs: 2.2 10*3/uL (ref 0.7–4.0)
MCH: 30 pg (ref 26.0–34.0)
MCHC: 33.5 g/dL (ref 30.0–36.0)
MCV: 89.7 fL (ref 78.0–100.0)
Monocytes Absolute: 0.8 10*3/uL (ref 0.1–1.0)
Monocytes Relative: 9 %
Neutro Abs: 5.5 10*3/uL (ref 1.7–7.7)
Neutrophils Relative %: 63 %
Platelets: 238 10*3/uL (ref 150–400)
RBC: 5.26 MIL/uL (ref 4.22–5.81)
RDW: 13.5 % (ref 11.5–15.5)
WBC: 8.7 10*3/uL (ref 4.0–10.5)

## 2016-05-21 LAB — COMPREHENSIVE METABOLIC PANEL
ALT: 31 U/L (ref 17–63)
AST: 26 U/L (ref 15–41)
Albumin: 4.1 g/dL (ref 3.5–5.0)
Alkaline Phosphatase: 61 U/L (ref 38–126)
Anion gap: 12 (ref 5–15)
BUN: 42 mg/dL — ABNORMAL HIGH (ref 6–20)
CO2: 28 mmol/L (ref 22–32)
Calcium: 9.5 mg/dL (ref 8.9–10.3)
Chloride: 97 mmol/L — ABNORMAL LOW (ref 101–111)
Creatinine, Ser: 2.31 mg/dL — ABNORMAL HIGH (ref 0.61–1.24)
GFR calc Af Amer: 32 mL/min — ABNORMAL LOW (ref >60)
GFR calc non Af Amer: 28 mL/min — ABNORMAL LOW (ref >60)
Glucose, Bld: 195 mg/dL — ABNORMAL HIGH (ref 65–99)
Potassium: 4.6 mmol/L (ref 3.5–5.1)
Sodium: 137 mmol/L (ref 135–145)
Total Bilirubin: 0.8 mg/dL (ref 0.3–1.2)
Total Protein: 7.5 g/dL (ref 6.5–8.1)

## 2016-05-21 LAB — TSH: TSH: 1.575 u[IU]/mL (ref 0.350–4.500)

## 2016-05-21 LAB — URINALYSIS, ROUTINE W REFLEX MICROSCOPIC
Bacteria, UA: NONE SEEN
Bilirubin Urine: NEGATIVE
Glucose, UA: 50 mg/dL — AB
Ketones, ur: NEGATIVE mg/dL
Leukocytes, UA: NEGATIVE
Nitrite: NEGATIVE
Protein, ur: 100 mg/dL — AB
Specific Gravity, Urine: 1.019 (ref 1.005–1.030)
pH: 5 (ref 5.0–8.0)

## 2016-05-21 LAB — CBG MONITORING, ED: Glucose-Capillary: 223 mg/dL — ABNORMAL HIGH (ref 65–99)

## 2016-05-21 LAB — GLUCOSE, CAPILLARY
Glucose-Capillary: 161 mg/dL — ABNORMAL HIGH (ref 65–99)
Glucose-Capillary: 307 mg/dL — ABNORMAL HIGH (ref 65–99)

## 2016-05-21 LAB — LIPASE, BLOOD: Lipase: 41 U/L (ref 11–51)

## 2016-05-21 LAB — TROPONIN I
Troponin I: 0.05 ng/mL (ref <0.03)
Troponin I: 0.05 ng/mL (ref <0.03)
Troponin I: 0.04 ng/mL (ref <0.03)

## 2016-05-21 MED ORDER — ISOSORBIDE MONONITRATE ER 30 MG PO TB24
30.0000 mg | ORAL_TABLET | Freq: Every day | ORAL | Status: DC
  Administered 2016-05-22 – 2016-05-25 (×4): 30 mg via ORAL
  Filled 2016-05-21 (×4): qty 1

## 2016-05-21 MED ORDER — ALPRAZOLAM 0.5 MG PO TABS
0.5000 mg | ORAL_TABLET | Freq: Two times a day (BID) | ORAL | Status: DC | PRN
  Administered 2016-05-21 – 2016-05-24 (×6): 0.5 mg via ORAL
  Filled 2016-05-21 (×6): qty 1

## 2016-05-21 MED ORDER — INSULIN ASPART PROT & ASPART (70-30 MIX) 100 UNIT/ML ~~LOC~~ SUSP
70.0000 [IU] | Freq: Two times a day (BID) | SUBCUTANEOUS | Status: DC
  Administered 2016-05-21 – 2016-05-25 (×7): 70 [IU] via SUBCUTANEOUS
  Filled 2016-05-21 (×2): qty 10

## 2016-05-21 MED ORDER — INSULIN ASPART 100 UNIT/ML ~~LOC~~ SOLN
0.0000 [IU] | Freq: Three times a day (TID) | SUBCUTANEOUS | Status: DC
  Administered 2016-05-21: 2 [IU] via SUBCUTANEOUS
  Administered 2016-05-22: 1 [IU] via SUBCUTANEOUS
  Administered 2016-05-22 (×2): 2 [IU] via SUBCUTANEOUS
  Administered 2016-05-23 (×2): 5 [IU] via SUBCUTANEOUS
  Administered 2016-05-23: 2 [IU] via SUBCUTANEOUS
  Administered 2016-05-24: 3 [IU] via SUBCUTANEOUS
  Administered 2016-05-24: 7 [IU] via SUBCUTANEOUS
  Administered 2016-05-25: 3 [IU] via SUBCUTANEOUS
  Administered 2016-05-25: 5 [IU] via SUBCUTANEOUS

## 2016-05-21 MED ORDER — ASPIRIN EC 81 MG PO TBEC
81.0000 mg | DELAYED_RELEASE_TABLET | Freq: Every day | ORAL | Status: DC
  Administered 2016-05-22 – 2016-05-25 (×4): 81 mg via ORAL
  Filled 2016-05-21 (×4): qty 1

## 2016-05-21 MED ORDER — FLUTICASONE PROPIONATE 50 MCG/ACT NA SUSP: 1.0000 | Freq: Every day | NASAL | Status: DC | PRN

## 2016-05-21 MED ORDER — INSULIN ASPART 100 UNIT/ML ~~LOC~~ SOLN
0.0000 [IU] | Freq: Every day | SUBCUTANEOUS | Status: DC
  Administered 2016-05-21: 4 [IU] via SUBCUTANEOUS
  Administered 2016-05-22 – 2016-05-24 (×2): 2 [IU] via SUBCUTANEOUS

## 2016-05-21 MED ORDER — HYDROCODONE-ACETAMINOPHEN 10-325 MG PO TABS
1.0000 | ORAL_TABLET | Freq: Three times a day (TID) | ORAL | Status: DC | PRN
  Filled 2016-05-21: qty 1

## 2016-05-21 MED ORDER — PANTOPRAZOLE SODIUM 40 MG PO TBEC
40.0000 mg | DELAYED_RELEASE_TABLET | Freq: Every day | ORAL | Status: DC
  Administered 2016-05-22 – 2016-05-25 (×4): 40 mg via ORAL
  Filled 2016-05-21 (×4): qty 1

## 2016-05-21 MED ORDER — GABAPENTIN 300 MG PO CAPS
300.0000 mg | ORAL_CAPSULE | Freq: Two times a day (BID) | ORAL | Status: DC
  Administered 2016-05-21 – 2016-05-25 (×8): 300 mg via ORAL
  Filled 2016-05-21 (×7): qty 1

## 2016-05-21 MED ORDER — SODIUM CHLORIDE 0.9 % IV BOLUS (SEPSIS)
1000.0000 mL | Freq: Once | INTRAVENOUS | Status: AC
  Administered 2016-05-21: 1000 mL via INTRAVENOUS

## 2016-05-21 MED ORDER — HYDROCODONE-ACETAMINOPHEN 5-325 MG PO TABS
1.0000 | ORAL_TABLET | Freq: Four times a day (QID) | ORAL | Status: DC | PRN
  Administered 2016-05-22: 1 via ORAL
  Administered 2016-05-22 – 2016-05-23 (×4): 2 via ORAL
  Filled 2016-05-21 (×4): qty 2
  Filled 2016-05-21: qty 1

## 2016-05-21 MED ORDER — SERTRALINE HCL 50 MG PO TABS
50.0000 mg | ORAL_TABLET | Freq: Every day | ORAL | Status: DC
  Administered 2016-05-25: 50 mg via ORAL
  Filled 2016-05-21 (×4): qty 1

## 2016-05-21 MED ORDER — ACETAMINOPHEN 500 MG PO TABS
1000.0000 mg | ORAL_TABLET | Freq: Once | ORAL | Status: AC
  Administered 2016-05-21: 1000 mg via ORAL
  Filled 2016-05-21: qty 2

## 2016-05-21 MED ORDER — ENOXAPARIN SODIUM 60 MG/0.6ML ~~LOC~~ SOLN
60.0000 mg | SUBCUTANEOUS | Status: DC
  Administered 2016-05-21 – 2016-05-23 (×2): 60 mg via SUBCUTANEOUS
  Filled 2016-05-21 (×3): qty 0.6

## 2016-05-21 MED ORDER — SODIUM CHLORIDE 0.9 % IV SOLN
INTRAVENOUS | Status: AC
  Administered 2016-05-21: 16:00:00 via INTRAVENOUS

## 2016-05-21 NOTE — Telephone Encounter (Signed)
Pt is having headache with seizure like activity with vision changes Pt instructed to go to ER Verbalizes understanding

## 2016-05-21 NOTE — Progress Notes (Addendum)
Pt had two previous troponin levels of 0.05, now down to 0.04, MD made aware and MD acknowledged, will continue to monitor.

## 2016-05-21 NOTE — H&P (Signed)
History and Physical    ATHER ROCKER [id number] DOB: 1950-05-25 DOA: 05/21/2016  PCP: Claretta Fraise, MD  Patient coming from: [place].   Chief Complaint: presyncopal episodes multiple over the last 8 to 9 months.   HPI: GIOVANIE SCHRACK is a 66 y.o. male with medical history significant of diabetes mellitus, hypertension, CAD, s/p PCI, arthritis, GERD, hiatal hernia, presents to ED with multiple episodes of presyncopal episodes over the last 8 to 9 months. As per the patient, the episode starts with his visual field all yellow, followed by blurry vision and he feels like passing out and that's when he sits on the floor. He reports it happens when he is laying down. It will be followed by sharp pain behind his right eye and right sided headache. He also reports some chest pressure and associated with nausea , palpitations. He reports he never completely loses consciousness. He was never worked up in the last few months for these episodes. On arrival to ED, he underwent ct of the head and neck , which showed some pseudo arthrosis and foraminal stenosis, but otherwise unremarkable.  CT maxillofacial unremarkable.  US carotid does not show any sig stenosis in the ICA.  He was referred for admission to medical service for further evaluation. EKG sinus bradycardia, unchanged from before.     Review of Systems: As per HPI otherwise 10 point review of systems negative.    Past Medical History:  Diagnosis Date  . AODM   . Arthritis   . CAD (coronary artery disease)    2010  LAD 50%, RCA 100%.  DES to RCA.  EF 55%  . Cellulitis and abscess rt groin   . CHEST PAIN-UNSPECIFIED   . Diabetes mellitus   . Disorders of iron metabolism   . DISORDERS OF IRON METABOLISM   . GERD (gastroesophageal reflux disease)   . Hyperlipidemia   . Hypertension   . Low serum testosterone level   . Medically noncompliant   . MURMUR     Past Surgical History:  Procedure Laterality Date  . BACK SURGERY  2015   ACDF  by Dr. [name]  . COLONOSCOPY N/A 10/01/2014   Procedure: COLONOSCOPY;  Surgeon: Daneil Dolin, MD;  Location: [facility];  Service: Endoscopy;  Laterality: N/A;  815  . CORONARY STENT PLACEMENT  2000   By Dr. Olevia Perches  . ESOPHAGOGASTRODUODENOSCOPY     esophagus stretched remotely at [facility]  . ESOPHAGOGASTRODUODENOSCOPY N/A 10/01/2014   Procedure: ESOPHAGOGASTRODUODENOSCOPY (EGD);  Surgeon: Daneil Dolin, MD;  Location: [facility];  Service: Endoscopy;  Laterality: N/A;  . HERNIA REPAIR  [id number]   umbilical  . LESION REMOVAL     Lip and hand   . NECK SURGERY       reports that he has been smoking Cigarettes.  He has a 12.75 pack-year smoking history. He has never used smokeless tobacco. He reports that he does not drink alcohol or use drugs.  Allergies  Allergen Reactions  . Shellfish Allergy Anaphylaxis    Tongue swelling, hives  . Ace Inhibitors Other (See Comments) and Cough    CKD, renal failure   . Invokana [Canagliflozin] Other (See Comments)    Syncope / dehydration  . Metformin And Related Itching  . Pravastatin Sodium     myalgias  . Fenofibrate     Body aches  . Horse-Derived Products     REACTION: Rash  . Iodine     ????  . Lexapro [Escitalopram Oxalate]  Buzzing in ears,headache, felt like a zombie  . Lisinopril Cough  . Crestor [Rosuvastatin] Other (See Comments)    Myalgias   . Lipitor [Atorvastatin] Other (See Comments)    myalgias    Family History  Problem Relation Age of Onset  . Diabetes Father   . Valvular heart disease Father   . Arthritis Father   . Heart disease Father   . Alzheimer's disease Mother   . Hyperlipidemia Mother   . Hypertension Mother   . Cancer Mother     lung  . Arthritis Mother   . Arthritis Sister     rheumatoid  . Diabetes Sister   . Hypertension Sister   . Hyperlipidemia Sister   . Dementia Maternal Aunt   . Dementia Maternal Uncle   . Heart disease Maternal Uncle   . Cancer Paternal Uncle     stomach  .  Colon cancer Neg Hx   . Liver disease Neg Hx    family history reviewed.   Prior to Admission medications   Medication Sig Start Date End Date Taking? Authorizing Provider  ALPRAZolam Duanne Moron) 0.5 MG tablet TAKE ONE TABLET BY MOUTH TWICE DAILY AS NEEDED 03/30/16  Yes Fransisca Kaufmann Dettinger, MD  aspirin (ASPIRIN EC) 81 MG EC tablet Take by mouth.   Yes Historical Provider, MD  furosemide (LASIX) 20 MG tablet TAKE ONE TABLET BY MOUTH ONCE DAILY AS NEEDED Patient taking differently: Take 20 mg by mouth daily. TAKE ONE TABLET BY MOUTH ONCE DAILY AS NEEDED 09/17/15  Yes Claretta Fraise, MD  gabapentin (NEURONTIN) 300 MG capsule Take 1 capsule (300 mg total) by mouth 3 (three) times daily. Patient taking differently: Take 300 mg by mouth 2 (two) times daily.  09/17/15  Yes Claretta Fraise, MD  gemfibrozil (LOPID) 600 MG tablet Take 1 tablet (600 mg total) by mouth 2 (two) times daily before a meal. For cholesterol and triglycerides 02/20/16  Yes Claretta Fraise, MD  HYDROcodone-acetaminophen (NORCO) 10-325 MG tablet Take 1-2 tablets by mouth every 8 (eight) hours as needed for moderate pain.  01/10/16  Yes Historical Provider, MD  isosorbide mononitrate (IMDUR) 30 MG 24 hr tablet TAKE ONE TABLET BY MOUTH ONCE DAILY 05/04/16  Yes Claretta Fraise, MD  Lactobacillus (PROBIOTIC ACIDOPHILUS PO) Take 1 capsule by mouth daily.   Yes Historical Provider, MD  meloxicam (MOBIC) 15 MG tablet Take 1 tablet (15 mg total) by mouth daily. 09/17/15  Yes Claretta Fraise, MD  metoprolol tartrate (LOPRESSOR) 25 MG tablet Take 0.5 tablets (12.5 mg total) by mouth 2 (two) times daily. 09/17/15  Yes Claretta Fraise, MD  mometasone (NASONEX) 50 MCG/ACT nasal spray Place 2 sprays into the nose daily. Patient taking differently: Place 2 sprays into the nose daily as needed (congestion).  06/13/15  Yes Claretta Fraise, MD  NOVOLIN 70/30 RELION (70-30) 100 UNIT/ML injection INJECT 70 UNITS SUBCUTANEOUSLY TWICE DAILY WITH  A  MEAL 01/22/16  Yes Claretta Fraise,  MD  omeprazole (PRILOSEC) 40 MG capsule TAKE ONE CAPSULE BY MOUTH TWICE DAILY HALF AN HOUR BEFORE BREAKFAST AND SUPPER 09/17/15  Yes Claretta Fraise, MD  Red Yeast Rice Extract (RED YEAST RICE PO) Take 2 tablets by mouth daily.    Yes Historical Provider, MD  sertraline (ZOLOFT) 50 MG tablet Take 1 tablet (50 mg total) by mouth daily. 12/30/15  Yes [name], FNP  sitaGLIPtin (JANUVIA) 100 MG tablet Take 1 tablet (100 mg total) by mouth daily. 03/23/16  Yes Cherre Robins, PharmD  telmisartan (MICARDIS)  40 MG tablet TAKE ONE TABLET BY MOUTH ONCE DAILY (REPLACING  LISINOPRIL) 09/17/15  Yes Claretta Fraise, MD  Insulin Pen Needle (PEN NEEDLES) 31G X 6 MM MISC Use to inject insulin two to three times a day 01/03/15   Cherre Robins, PharmD    Physical Exam: Vitals:   05/21/16 1300 05/21/16 1400 05/21/16 1430 05/21/16 1500  BP: 146/98 150/89 147/73 138/73  Pulse: (!) 50 (!) 57 (!) 59 (!) 51  Resp: 14 15 14 12   Temp:      TempSrc:      SpO2: 99% 99% 100% 100%  Weight:      Height:          Constitutional: NAD, calm, comfortable Vitals:   05/21/16 1300 05/21/16 1400 05/21/16 1430 05/21/16 1500  BP: 146/98 150/89 147/73 138/73  Pulse: (!) 50 (!) 57 (!) 59 (!) 51  Resp: 14 15 14 12   Temp:      TempSrc:      SpO2: 99% 99% 100% 100%  Weight:      Height:       Eyes: PERRL, lids and conjunctivae normal ENMT: Mucous membranes are moist. Posterior pharynx clear of any exudate or lesions.Normal dentition.  Neck: normal, supple, no masses, no thyromegaly Respiratory: clear to auscultation bilaterally, no wheezing, no crackles. Normal respiratory effort. No accessory muscle use.  Cardiovascular: Regular rate and rhythm, no murmurs / rubs / gallops. No extremity edema. 2+ pedal pulses. No carotid bruits.  Abdomen: no tenderness, no masses palpated. No hepatosplenomegaly. Bowel sounds positive.  Musculoskeletal: no clubbing / cyanosis. No joint deformity upper and lower extremities. Good ROM, no  contractures. Normal muscle tone.  Skin: no rashes, lesions, ulcers. No induration Neurologic: CN 2-12 grossly intact. Sensation intact, DTR normal. Strength 5/5 in all 4.  Psychiatric: Normal judgment and insight. Alert and oriented x 3. Normal mood.    Labs on Admission: I have personally reviewed following labs and imaging studies  CBC:  Recent Labs Lab 05/21/16 1114  WBC 8.7  NEUTROABS 5.5  HGB 15.8  HCT 47.2  MCV 89.7  PLT 99991111   Basic Metabolic Panel:  Recent Labs Lab 05/21/16 1114  NA 137  K 4.6  CL 97*  CO2 28  GLUCOSE 195*  BUN 42*  CREATININE 2.31*  CALCIUM 9.5   GFR: Estimated Creatinine Clearance: 45.4 mL/min (by C-G formula based on SCr of 2.31 mg/dL (H)). Liver Function Tests:  Recent Labs Lab 05/21/16 1114  AST 26  ALT 31  ALKPHOS 61  BILITOT 0.8  PROT 7.5  ALBUMIN 4.1    Recent Labs Lab 05/21/16 1114  LIPASE 41   No results for input(s): AMMONIA in the last 168 hours. Coagulation Profile: No results for input(s): INR, PROTIME in the last 168 hours. Cardiac Enzymes:  Recent Labs Lab 05/21/16 1114  TROPONINI 0.05*   BNP (last 3 results) No results for input(s): PROBNP in the last 8760 hours. HbA1C: No results for input(s): HGBA1C in the last 72 hours. CBG:  Recent Labs Lab 05/21/16 1215  GLUCAP 223*   Lipid Profile: No results for input(s): CHOL, HDL, LDLCALC, TRIG, CHOLHDL, LDLDIRECT in the last 72 hours. Thyroid Function Tests: No results for input(s): TSH, T4TOTAL, FREET4, T3FREE, THYROIDAB in the last 72 hours. Anemia Panel: No results for input(s): VITAMINB12, FOLATE, FERRITIN, TIBC, IRON, RETICCTPCT in the last 72 hours. Urine analysis:    Component Value Date/Time   COLORURINE YELLOW 05/21/2016 1350   APPEARANCEUR HAZY (A) 05/21/2016 1350  APPEARANCEUR Clear 02/11/2016 1149   LABSPEC 1.019 05/21/2016 1350   PHURINE 5.0 05/21/2016 1350   GLUCOSEU 50 (A) 05/21/2016 1350   HGBUR SMALL (A) 05/21/2016 1350    BILIRUBINUR NEGATIVE 05/21/2016 1350   BILIRUBINUR Negative 02/11/2016 1149   KETONESUR NEGATIVE 05/21/2016 1350   PROTEINUR 100 (A) 05/21/2016 1350   UROBILINOGEN negative 08/13/2014 1725   UROBILINOGEN 0.2 08/07/2013 1747   NITRITE NEGATIVE 05/21/2016 1350   LEUKOCYTESUR NEGATIVE 05/21/2016 1350   LEUKOCYTESUR Negative 02/11/2016 1149   Sepsis Labs: !!!!!!!!!!!!!!!!!!!!!!!!!!!!!!!!!!!!!!!!!!!! @LABRCNTIP (procalcitonin:4,lacticidven:4) )No results found for this or any previous visit (from the past 240 hour(s)).   Radiological Exams on Admission: Dg Chest 2 View  Result Date: 05/21/2016 CLINICAL DATA:  Generalized weakness and body aches and mid chest pain for the past 3 days. Patient also notes right-sided facial swelling. History of coronary artery disease, former smoker. EXAM: CHEST  2 VIEW COMPARISON:  PA and lateral chest x-Arshan of February 11, 2016 FINDINGS: The lungs are well-expanded. The interstitial markings are coarse though stable. There is no alveolar infiltrate or pleural effusion. The heart is normal in size. The pulmonary vascularity is not engorged. The mediastinum is normal in width. The bony thorax exhibits no acute abnormality. IMPRESSION: Chronic bronchitic changes. No pneumonia, CHF, nor other acute cardiopulmonary abnormality. Electronically Signed   By: David  Martinique M.D.   On: 05/21/2016 11:47   Ct Head Wo Contrast  Result Date: 05/21/2016 CLINICAL DATA:  Syncope.  Facial swelling.  Headache. EXAM: CT HEAD WITHOUT CONTRAST CT CERVICAL SPINE WITHOUT CONTRAST TECHNIQUE: Multidetector CT imaging of the head and cervical spine was performed following the standard protocol without intravenous contrast. Multiplanar CT image reconstructions of the cervical spine were also generated. COMPARISON:  None. FINDINGS: CT HEAD FINDINGS Brain: No evidence of acute infarction, hemorrhage, hydrocephalus, extra-axial collection or mass lesion/mass effect. Vascular: No hyperdense vessel or  unexpected calcification. Skull: Negative Sinuses/Orbits: Mild mucosal edema in the ethmoid sinuses. Normal orbital structures. Other: None CT CERVICAL SPINE FINDINGS Alignment: Normal Skull base and vertebrae: Negative for fracture or mass. No acute skeletal abnormality. ACDF with anterior plate C5-6 and D34-534. Soft tissues and spinal canal: Negative for mass or adenopathy. Carotid artery calcification bilaterally. Disc levels:  C2-3:  Mild disc bulging without stenosis C3-4:  Negative C4-5:  Negative C5-6: ACDF with anterior plate. No definite bony fusion. Possible pseudarthrosis. Mild facet degeneration. No significant stenosis C6-7: ACDF with solid interbody fusion. Mild spinal stenosis due to uncinate spurring. Mild foraminal narrowing bilaterally. C7-T1:  Negative Upper chest: Negative Other: None IMPRESSION: Negative CT of the head No acute abnormality in the cervical spine. ACDF C5-6 with probable pseudarthrosis. ACDF with solid fusion at C6-7. Mild spinal and foraminal stenosis at C6-7 due to spurring. Electronically Signed   By: Franchot Gallo M.D.   On: 05/21/2016 12:26   Ct Cervical Spine Wo Contrast  Result Date: 05/21/2016 CLINICAL DATA:  Syncope.  Facial swelling.  Headache. EXAM: CT HEAD WITHOUT CONTRAST CT CERVICAL SPINE WITHOUT CONTRAST TECHNIQUE: Multidetector CT imaging of the head and cervical spine was performed following the standard protocol without intravenous contrast. Multiplanar CT image reconstructions of the cervical spine were also generated. COMPARISON:  None. FINDINGS: CT HEAD FINDINGS Brain: No evidence of acute infarction, hemorrhage, hydrocephalus, extra-axial collection or mass lesion/mass effect. Vascular: No hyperdense vessel or unexpected calcification. Skull: Negative Sinuses/Orbits: Mild mucosal edema in the ethmoid sinuses. Normal orbital structures. Other: None CT CERVICAL SPINE FINDINGS Alignment: Normal Skull base and vertebrae: Negative  for fracture or mass. No acute  skeletal abnormality. ACDF with anterior plate C5-6 and D34-534. Soft tissues and spinal canal: Negative for mass or adenopathy. Carotid artery calcification bilaterally. Disc levels:  C2-3:  Mild disc bulging without stenosis C3-4:  Negative C4-5:  Negative C5-6: ACDF with anterior plate. No definite bony fusion. Possible pseudarthrosis. Mild facet degeneration. No significant stenosis C6-7: ACDF with solid interbody fusion. Mild spinal stenosis due to uncinate spurring. Mild foraminal narrowing bilaterally. C7-T1:  Negative Upper chest: Negative Other: None IMPRESSION: Negative CT of the head No acute abnormality in the cervical spine. ACDF C5-6 with probable pseudarthrosis. ACDF with solid fusion at C6-7. Mild spinal and foraminal stenosis at C6-7 due to spurring. Electronically Signed   By: Franchot Gallo M.D.   On: 05/21/2016 12:26   Ct Maxillofacial Wo Contrast  Result Date: 05/21/2016 CLINICAL DATA:  Generalized weakness, right side facial EXAM: CT MAXILLOFACIAL WITHOUT CONTRAST TECHNIQUE: Multidetector CT imaging of the maxillofacial structures was performed. Multiplanar CT image reconstructions were also generated. A small metallic BB was placed on the right temple in order to reliably differentiate right from left. COMPARISON:  CT head same day FINDINGS: Osseous: No facial fractures are noted. There is right deviation of nasal bony septum. Orbits: No intraorbital hematoma. Bilateral eye globe symmetrical in appearance. Coronal images shows no orbital rim or orbital floor fracture. Sinuses: Mild mucosal thickening bilateral ethmoid air cells. Coronal images shows patent semilunar canal bilaterally. There is mild mucosal thickening superior aspect of maxillary sinus bilaterally. Soft tissues: No facial fluid collection. The nasal turbinates are unremarkable. Sagittal images shows patent nasopharyngeal and oropharyngeal airway. Limited intracranial: Unremarkable IMPRESSION: 1. No facial fractures of fluid  collects mild mucosal thickening bilateral ethmoid air cells. Mild mucosal thickening superior aspect bilateral maxillary sinus. 2. There is right deviation of nasal bony septum. Bilateral semilunar canal is patent. 3. No intraorbital hematoma. No orbital rim or orbital floor fracture. No facial fluid collection. Electronically Signed   By: Lahoma Crocker M.D.   On: 05/21/2016 12:41    EKG: Independently reviewed. Sinus bradycardia.   Assessment/Plan Active Problems:   Syncope Presyncopal episodes: Multiple, not related to posture.  Initial CT head negative. Follow up with MRI brain without contrast.  Differential include arrhythmias vs ? Seizures  Vs dehydration. US duplex of the carotids does not show any sig stenosis.  ekg does not show any sig abnormalities that would explain the symptoms.  Monitor him on telemetry.  Serial troponins.  Hydrate with NS fluids.   Acute renal failure: unclear etiology. ? Dehydration.  Get US RENAL. UA does  Not show any infection.  Urine electrolytes. Repeat renal parameters in am.    Hypertension: controlled.    Diabetes Mellitus: CBG (last 3)   Recent Labs  05/21/16 1215 05/21/16 1646  GLUCAP 223* 161*    Resume SSI.  Hold oral meds.  Get Hgba1c.      DVT prophylaxis: lovenox.  Code Status: full code.  Family Communication: wife at bedside.  Disposition Plan: pending further eval.  Consults called:none.  Admission status: obs tele.    Hosie Poisson MD [facility] Pager [phone]  If 7PM-7AM, please contact night-coverage [email or web address] Password [email or web address]  05/21/2016, 3:40 PM

## 2016-05-21 NOTE — ED Notes (Signed)
RN called to room by pt wife. Wife reports pt was "playing on phone, dropped phone and went out". Pt responded to sternal rub upon RN assessment, alert and oriented x 4, neurologically intact. Vss. EKG obtained and MD to bedside. [id number]-226.

## 2016-05-21 NOTE — ED Triage Notes (Signed)
PT c/o generalized weakness, body aches, right sided facial pain/ear pain and headache x3 days.

## 2016-05-21 NOTE — ED Provider Notes (Signed)
[facility] DEPT Provider Note   CSN: [id number] Arrival date & time: 05/21/16  1039  By signing my name below, I, Hilbert Odor, attest that this documentation has been prepared under the direction and in the presence of Isla Pence, MD. Electronically Signed: Hilbert Odor, Scribe. 05/21/16. 10:49 AM. History   Chief Complaint Chief Complaint  Patient presents with  . Weakness     The history is provided by the patient and a relative. No language interpreter was used.   HPI Comments: AMAREE JONE is a 66 y.o. male who presents to the Emergency Department complaining of generalized weakness, body aches, right sided facial pain and headache for 3 days. Patient also reports some mental confusion since last night. Pt's wife said that pt had some episodes last night where he passed out.  Patient believe that he has an infection because of the swelling on the right side of his face. Patient has been working around [employer] lately as part of his job. He states that he was wearing a mask. Patient reports some hearing loss and slight visual loss. He denies abdominal pain. Past Medical History:  Diagnosis Date  . AODM   . Arthritis   . CAD (coronary artery disease)    2010  LAD 50%, RCA 100%.  DES to RCA.  EF 55%  . Cellulitis and abscess rt groin   . CHEST PAIN-UNSPECIFIED   . Diabetes mellitus   . Disorders of iron metabolism   . DISORDERS OF IRON METABOLISM   . GERD (gastroesophageal reflux disease)   . Hyperlipidemia   . Hypertension   . Low serum testosterone level   . Medically noncompliant   . MURMUR     Patient Active Problem List   Diagnosis Date Noted  . Syncope 05/21/2016  . Diabetic neuropathy ([place]) 01/13/2016  . Depression 11/01/2015  . Diabetic peripheral neuropathy associated with type 2 diabetes mellitus ([place]) 11/08/2014  . Mucosal abnormality of stomach   . History of colonic polyps   . GERD (gastroesophageal reflux disease) 09/06/2014  . Low serum  testosterone level 08/01/2014  . DDD (degenerative disc disease), cervical 12/20/2013  . Obesity (BMI 30.0-34.9) 12/11/2013  . Headache 08/07/2013  . Medically noncompliant 10/25/2011  . CAD S/P percutaneous coronary angioplasty 10/25/2011  . MURMUR 04/16/2009  . Essential hypertension, benign 08/02/2008  . Hyperlipidemia associated with type 2 diabetes mellitus ([place]) 07/31/2008  . DISORDERS OF IRON METABOLISM 07/31/2008    Past Surgical History:  Procedure Laterality Date  . BACK SURGERY  2015   ACDF by Dr. [name]  . COLONOSCOPY N/A 10/01/2014   Procedure: COLONOSCOPY;  Surgeon: Daneil Dolin, MD;  Location: [facility] END[facility] SU[facility];  Service: Endoscopy;  Laterality: N/A;  815  . CORONARY STENT PLACEMENT  2000   By Dr. Olevia Perches  . ESOPHAGOGASTRODUODENOSCOPY     esophagus stretched remotely at [facility]  . ESOPHAGOGASTRODUODENOSCOPY N/A 10/01/2014   Procedure: ESOPHAGOGASTRODUODENOSCOPY (EGD);  Surgeon: Daneil Dolin, MD;  Location: [facility] ENDO SUITE;  Service: Endoscopy;  Laterality: N/A;  . HERNIA REPAIR  [id number]   umbilical  . LESION REMOVAL     Lip and hand   . NECK SURGERY         Home Medications    Prior to Admission medications   Medication Sig Start Date End Date Taking? Authorizing Provider  ALPRAZolam Duanne Moron) 0.5 MG tablet TAKE ONE TABLET BY MOUTH TWICE DAILY AS NEEDED 03/30/16   Fransisca Kaufmann Dettinger, MD  aspirin (ASPIRIN EC) 81 MG  EC tablet Take by mouth.    Historical Provider, MD  furosemide (LASIX) 20 MG tablet TAKE ONE TABLET BY MOUTH ONCE DAILY AS NEEDED 09/17/15   Claretta Fraise, MD  gabapentin (NEURONTIN) 300 MG capsule Take 1 capsule (300 mg total) by mouth 3 (three) times daily. 09/17/15   Claretta Fraise, MD  gemfibrozil (LOPID) 600 MG tablet Take 1 tablet (600 mg total) by mouth 2 (two) times daily before a meal. For cholesterol and triglycerides 02/20/16   Claretta Fraise, MD  HYDROcodone-acetaminophen [facility]) 10-325 MG tablet  01/10/16   Historical Provider, MD  insulin NPH-regular  Human (HUMULIN 70/30) (70-30) 100 UNIT/ML injection Inject 70 Units into the skin 2 (two) times daily with a meal. Patient taking differently: Inject 75 Units into the skin 2 (two) times daily with a meal.  10/08/15   Claretta Fraise, MD  Insulin Pen Needle (PEN NEEDLES) 31G X 6 MM MISC Use to inject insulin two to three times a day 01/03/15   Cherre Robins, PharmD  isosorbide mononitrate (IMDUR) 30 MG 24 hr tablet Take 15 mg by mouth daily.  09/22/15   Historical Provider, MD  isosorbide mononitrate (IMDUR) 30 MG 24 hr tablet TAKE ONE TABLET BY MOUTH ONCE DAILY 05/04/16   Claretta Fraise, MD  Lactobacillus (PROBIOTIC ACIDOPHILUS PO) Take 1 capsule by mouth daily.    Historical Provider, MD  meloxicam (MOBIC) 15 MG tablet Take 1 tablet (15 mg total) by mouth daily. 09/17/15   Claretta Fraise, MD  metoprolol tartrate (LOPRESSOR) 25 MG tablet Take 0.5 tablets (12.5 mg total) by mouth 2 (two) times daily. 09/17/15   Claretta Fraise, MD  mometasone (NASONEX) 50 MCG/ACT nasal spray Place 2 sprays into the nose daily. 06/13/15   Claretta Fraise, MD  NOVOLIN 70/30 RELION (70-30) 100 UNIT/ML injection INJECT 70 UNITS SUBCUTANEOUSLY TWICE DAILY WITH  A  MEAL 01/22/16   Claretta Fraise, MD  omeprazole (PRILOSEC) 40 MG capsule TAKE ONE CAPSULE BY MOUTH TWICE DAILY HALF AN HOUR BEFORE BREAKFAST AND SUPPER 09/17/15   Claretta Fraise, MD  Red Yeast Rice Extract (RED YEAST RICE PO) Take 2 tablets by mouth daily.     Historical Provider, MD  sertraline (ZOLOFT) 50 MG tablet Take 1 tablet (50 mg total) by mouth daily. 12/30/15   [name], FNP  sitaGLIPtin (JANUVIA) 100 MG tablet Take 1 tablet (100 mg total) by mouth daily. 03/23/16   Cherre Robins, PharmD  telmisartan (MICARDIS) 40 MG tablet TAKE ONE TABLET BY MOUTH ONCE DAILY (REPLACING  LISINOPRIL) 09/17/15   Claretta Fraise, MD    Family History Family History  Problem Relation Age of Onset  . Diabetes Father   . Valvular heart disease Father   . Arthritis Father   . Heart disease  Father   . Alzheimer's disease Mother   . Hyperlipidemia Mother   . Hypertension Mother   . Cancer Mother     lung  . Arthritis Mother   . Arthritis Sister     rheumatoid  . Diabetes Sister   . Hypertension Sister   . Hyperlipidemia Sister   . Dementia Maternal Aunt   . Dementia Maternal Uncle   . Heart disease Maternal Uncle   . Cancer Paternal Uncle     stomach  . Colon cancer Neg Hx   . Liver disease Neg Hx     Social History Social History  Substance Use Topics  . Smoking status: Current Some Day Smoker    Packs/day: 0.25  Years: 51.00    Types: Cigarettes    Last attempt to quit: 08/02/2014  . Smokeless tobacco: Never Used  . Alcohol use No     Comment: heavy in past, none in 10 years.      Allergies   Shellfish allergy; Ace inhibitors; Invokana [canagliflozin]; Metformin and related; Pravastatin sodium; Fenofibrate; Horse-derived products; Iodine; Lexapro [escitalopram oxalate]; Lisinopril; Crestor [rosuvastatin]; and Lipitor [atorvastatin]   Review of Systems Review of Systems  Constitutional: Positive for appetite change and fatigue.  HENT: Positive for sinus pressure.   Musculoskeletal: Positive for myalgias and neck pain.  Neurological: Positive for weakness.  All other systems reviewed and are negative.    Physical Exam Updated Vital Signs BP 146/78   Pulse (!) 49   Temp 98.6 F (37 C) (Oral)   Resp 13   Ht 6\' 2"  (1.88 m)   Wt 283 lb (128.4 kg)   SpO2 100%   BMI 36.34 kg/m   Physical Exam  Constitutional: He is oriented to person, place, and time. He appears well-developed and well-nourished.  HENT:  Head: Normocephalic and atraumatic.  Right Ear: External ear normal.  Left Ear: External ear normal.  Nose: Nose normal.  Mouth/Throat: Mucous membranes are dry.  Right sided maxillary sinus tenderness  Eyes: Conjunctivae are normal. Pupils are equal, round, and reactive to light.  Neck: Normal range of motion. Neck supple.    Cardiovascular: Normal rate and regular rhythm.  Exam reveals no gallop and no friction rub.   No murmur heard. Pulmonary/Chest: Effort normal and breath sounds normal. He has no wheezes. He has no rales.  Abdominal: Soft. Bowel sounds are normal. There is no tenderness.  Musculoskeletal: Normal range of motion.  Neurological: He is alert and oriented to person, place, and time.  Skin: Skin is warm.  Psychiatric: He has a normal mood and affect. His behavior is normal. Judgment and thought content normal.     ED Treatments / Results  DIAGNOSTIC STUDIES: Oxygen Saturation is 100% on RA, normal by my interpretation.    COORDINATION OF CARE: 10:49 AM Discussed treatment plan with pt at bedside and pt agreed to plan.  Labs (all labs ordered are listed, but only abnormal results are displayed) Labs Reviewed  COMPREHENSIVE METABOLIC PANEL - Abnormal; Notable for the following:       Result Value   Chloride 97 (*)    Glucose, Bld 195 (*)    BUN 42 (*)    Creatinine, Ser 2.31 (*)    GFR calc non Af Amer 28 (*)    GFR calc Af Amer 32 (*)    All other components within normal limits  TROPONIN I - Abnormal; Notable for the following:    Troponin I 0.05 (*)    All other components within normal limits  CBG MONITORING, ED - Abnormal; Notable for the following:    Glucose-Capillary 223 (*)    All other components within normal limits  CBC WITH DIFFERENTIAL/PLATELET  LIPASE, BLOOD  URINALYSIS, ROUTINE W REFLEX MICROSCOPIC    EKG  EKG Interpretation  Date/Time:  Thursday May 21 2016 12:11:32 EST Ventricular Rate:  51 PR Interval:    QRS Duration: 100 QT Interval:  459 QTC Calculation: 423 R Axis:   89 Text Interpretation:  Sinus rhythm Consider RVH w/ secondary repol abnormality Repol abnrm suggests ischemia, lateral leads no significant changes since last tracing Confirmed by [facility] MD, Olvin Rohr ([id number]) on 05/21/2016 1:03:12 PM       Radiology Dg  Chest 2 View  Result  Date: 05/21/2016 CLINICAL DATA:  Generalized weakness and body aches and mid chest pain for the past 3 days. Patient also notes right-sided facial swelling. History of coronary artery disease, former smoker. EXAM: CHEST  2 VIEW COMPARISON:  PA and lateral chest x-[facility]itor of February 11, 2016 FINDINGS: The lungs are well-expanded. The interstitial markings are coarse though stable. There is no alveolar infiltrate or pleural effusion. The heart is normal in size. The pulmonary vascularity is not engorged. The mediastinum is normal in width. The bony thorax exhibits no acute abnormality. IMPRESSION: Chronic bronchitic changes. No pneumonia, CHF, nor other acute cardiopulmonary abnormality. Electronically Signed   By: David  Martinique M.D.   On: 05/21/2016 11:47   Ct Head Wo Contrast  Result Date: 05/21/2016 CLINICAL DATA:  Syncope.  Facial swelling.  Headache. EXAM: CT HEAD WITHOUT CONTRAST CT CERVICAL SPINE WITHOUT CONTRAST TECHNIQUE: Multidetector CT imaging of the head and cervical spine was performed following the standard protocol without intravenous contrast. Multiplanar CT image reconstructions of the cervical spine were also generated. COMPARISON:  None. FINDINGS: CT HEAD FINDINGS Brain: No evidence of acute infarction, hemorrhage, hydrocephalus, extra-axial collection or mass lesion/mass effect. Vascular: No hyperdense vessel or unexpected calcification. Skull: Negative Sinuses/Orbits: Mild mucosal edema in the ethmoid sinuses. Normal orbital structures. Other: None CT CERVICAL SPINE FINDINGS Alignment: Normal Skull base and vertebrae: Negative for fracture or mass. No acute skeletal abnormality. ACDF with anterior plate C5-6 and D34-534. Soft tissues and spinal canal: Negative for mass or adenopathy. Carotid artery calcification bilaterally. Disc levels:  C2-3:  Mild disc bulging without stenosis C3-4:  Negative C4-5:  Negative C5-6: ACDF with anterior plate. No definite bony fusion. Possible pseudarthrosis. Mild  facet degeneration. No significant stenosis C6-7: ACDF with solid interbody fusion. Mild spinal stenosis due to uncinate spurring. Mild foraminal narrowing bilaterally. C7-T1:  Negative Upper chest: Negative Other: None IMPRESSION: Negative CT of the head No acute abnormality in the cervical spine. ACDF C5-6 with probable pseudarthrosis. ACDF with solid fusion at C6-7. Mild spinal and foraminal stenosis at C6-7 due to spurring. Electronically Signed   By: Franchot Gallo M.D.   On: 05/21/2016 12:26   Ct Cervical Spine Wo Contrast  Result Date: 05/21/2016 CLINICAL DATA:  Syncope.  Facial swelling.  Headache. EXAM: CT HEAD WITHOUT CONTRAST CT CERVICAL SPINE WITHOUT CONTRAST TECHNIQUE: Multidetector CT imaging of the head and cervical spine was performed following the standard protocol without intravenous contrast. Multiplanar CT image reconstructions of the cervical spine were also generated. COMPARISON:  None. FINDINGS: CT HEAD FINDINGS Brain: No evidence of acute infarction, hemorrhage, hydrocephalus, extra-axial collection or mass lesion/mass effect. Vascular: No hyperdense vessel or unexpected calcification. Skull: Negative Sinuses/Orbits: Mild mucosal edema in the ethmoid sinuses. Normal orbital structures. Other: None CT CERVICAL SPINE FINDINGS Alignment: Normal Skull base and vertebrae: Negative for fracture or mass. No acute skeletal abnormality. ACDF with anterior plate C5-6 and D34-534. Soft tissues and spinal canal: Negative for mass or adenopathy. Carotid artery calcification bilaterally. Disc levels:  C2-3:  Mild disc bulging without stenosis C3-4:  Negative C4-5:  Negative C5-6: ACDF with anterior plate. No definite bony fusion. Possible pseudarthrosis. Mild facet degeneration. No significant stenosis C6-7: ACDF with solid interbody fusion. Mild spinal stenosis due to uncinate spurring. Mild foraminal narrowing bilaterally. C7-T1:  Negative Upper chest: Negative Other: None IMPRESSION: Negative CT of the  head No acute abnormality in the cervical spine. ACDF C5-6 with probable pseudarthrosis. ACDF with solid fusion at C6-7.  Mild spinal and foraminal stenosis at C6-7 due to spurring. Electronically Signed   By: Franchot Gallo M.D.   On: 05/21/2016 12:26   Ct Maxillofacial Wo Contrast  Result Date: 05/21/2016 CLINICAL DATA:  Generalized weakness, right side facial EXAM: CT MAXILLOFACIAL WITHOUT CONTRAST TECHNIQUE: Multidetector CT imaging of the maxillofacial structures was performed. Multiplanar CT image reconstructions were also generated. A small metallic BB was placed on the right temple in order to reliably differentiate right from left. COMPARISON:  CT head same day FINDINGS: Osseous: No facial fractures are noted. There is right deviation of nasal bony septum. Orbits: No intraorbital hematoma. Bilateral eye globe symmetrical in appearance. Coronal images shows no orbital rim or orbital floor fracture. Sinuses: Mild mucosal thickening bilateral ethmoid air cells. Coronal images shows patent semilunar canal bilaterally. There is mild mucosal thickening superior aspect of maxillary sinus bilaterally. Soft tissues: No facial fluid collection. The nasal turbinates are unremarkable. Sagittal images shows patent nasopharyngeal and oropharyngeal airway. Limited intracranial: Unremarkable IMPRESSION: 1. No facial fractures of fluid collects mild mucosal thickening bilateral ethmoid air cells. Mild mucosal thickening superior aspect bilateral maxillary sinus. 2. There is right deviation of nasal bony septum. Bilateral semilunar canal is patent. 3. No intraorbital hematoma. No orbital rim or orbital floor fracture. No facial fluid collection. Electronically Signed   By: Lahoma Crocker M.D.   On: 05/21/2016 12:41    Procedures Procedures (including critical care time)  Medications Ordered in ED Medications  sodium chloride 0.9 % bolus 1,000 mL (not administered)  sodium chloride 0.9 % bolus 1,000 mL (1,000 mLs  Intravenous New Bag/Given 05/21/16 1119)     Initial Impression / Assessment and Plan / ED Course  I have reviewed the triage vital signs and the nursing notes.  Pertinent labs & imaging results that were available during my care of the patient were reviewed by me and considered in my medical decision making (see chart for details).  Clinical Course    Pt had 2 episodes while here of passing out.  Each time, he was on the monitor and there was no arrhythmia.  Sx resolved with a sternal rub.  The pt's wife said he was playing with the phone, then "went out."    The pt does have EKG changes from old and a slight elevation in his troponin.  Pt given IVFs for his AKI.  Pt d/w Dr. Karleen Hampshire (triad) for admission.  Final Clinical Impressions(s) / ED Diagnoses   Final diagnoses:  Syncope, unspecified syncope type  Elevated troponin  AKI (acute kidney injury) ([place])    New Prescriptions New Prescriptions   No medications on file   I personally performed the services described in this documentation, which was scribed in my presence. The recorded information has been reviewed and is accurate.     Isla Pence, MD 05/21/16 1343

## 2016-05-21 NOTE — ED Notes (Signed)
Troponin 0.05 reported to EDP.

## 2016-05-22 ENCOUNTER — Observation Stay (HOSPITAL_BASED_OUTPATIENT_CLINIC_OR_DEPARTMENT_OTHER): Payer: Medicare Other

## 2016-05-22 ENCOUNTER — Inpatient Hospital Stay: Admission: RE | Admit: 2016-05-22 | Payer: Medicare Other | Source: Ambulatory Visit

## 2016-05-22 ENCOUNTER — Ambulatory Visit: Payer: Medicare Other | Admitting: Family Medicine

## 2016-05-22 ENCOUNTER — Observation Stay (HOSPITAL_COMMUNITY)
Admit: 2016-05-22 | Discharge: 2016-05-22 | Disposition: A | Discharge status: Home / Self Care | Payer: Medicare Other | Attending: Internal Medicine | Admitting: Internal Medicine

## 2016-05-22 ENCOUNTER — Other Ambulatory Visit: Payer: Self-pay

## 2016-05-22 ENCOUNTER — Observation Stay (HOSPITAL_COMMUNITY): Payer: Medicare Other

## 2016-05-22 ENCOUNTER — Telehealth: Payer: Self-pay | Admitting: Family Medicine

## 2016-05-22 DIAGNOSIS — I6502 Occlusion and stenosis of left vertebral artery: Secondary | ICD-10-CM | POA: Diagnosis not present

## 2016-05-22 DIAGNOSIS — I119 Hypertensive heart disease without heart failure: Secondary | ICD-10-CM | POA: Diagnosis not present

## 2016-05-22 DIAGNOSIS — I639 Cerebral infarction, unspecified: Secondary | ICD-10-CM | POA: Diagnosis not present

## 2016-05-22 DIAGNOSIS — R55 Syncope and collapse: Secondary | ICD-10-CM

## 2016-05-22 DIAGNOSIS — E1142 Type 2 diabetes mellitus with diabetic polyneuropathy: Secondary | ICD-10-CM | POA: Diagnosis not present

## 2016-05-22 DIAGNOSIS — N179 Acute kidney failure, unspecified: Secondary | ICD-10-CM | POA: Diagnosis not present

## 2016-05-22 DIAGNOSIS — R569 Unspecified convulsions: Secondary | ICD-10-CM | POA: Diagnosis not present

## 2016-05-22 LAB — ECHOCARDIOGRAM COMPLETE
Height: 74 in
Weight: 4528 oz

## 2016-05-22 LAB — GLUCOSE, CAPILLARY
Glucose-Capillary: 198 mg/dL — ABNORMAL HIGH (ref 65–99)
Glucose-Capillary: 137 mg/dL — ABNORMAL HIGH (ref 65–99)
Glucose-Capillary: 163 mg/dL — ABNORMAL HIGH (ref 65–99)
Glucose-Capillary: 227 mg/dL — ABNORMAL HIGH (ref 65–99)

## 2016-05-22 LAB — BASIC METABOLIC PANEL
Anion gap: 8 (ref 5–15)
BUN: 37 mg/dL — ABNORMAL HIGH (ref 6–20)
CO2: 24 mmol/L (ref 22–32)
Calcium: 8.9 mg/dL (ref 8.9–10.3)
Chloride: 103 mmol/L (ref 101–111)
Creatinine, Ser: 1.85 mg/dL — ABNORMAL HIGH (ref 0.61–1.24)
GFR calc Af Amer: 42 mL/min — ABNORMAL LOW (ref >60)
GFR calc non Af Amer: 37 mL/min — ABNORMAL LOW (ref >60)
Glucose, Bld: 221 mg/dL — ABNORMAL HIGH (ref 65–99)
Potassium: 4.2 mmol/L (ref 3.5–5.1)
Sodium: 135 mmol/L (ref 135–145)

## 2016-05-22 LAB — TROPONIN I: Troponin I: 0.04 ng/mL (ref <0.03)

## 2016-05-22 LAB — CREATININE, URINE, RANDOM: Creatinine, Urine: 110.36 mg/dL

## 2016-05-22 LAB — SODIUM, URINE, RANDOM: Sodium, Ur: 112 mmol/L

## 2016-05-22 MED ORDER — CHLORPHEN-PHENYLEPH-ASA 2-7.8-325 MG PO TBEF: 2.0000 | EFFERVESCENT_TABLET | Freq: Every day | ORAL | Status: DC | PRN

## 2016-05-22 MED ORDER — SODIUM CHLORIDE 0.9 % IV SOLN
INTRAVENOUS | Status: DC
  Administered 2016-05-22 (×2): via INTRAVENOUS

## 2016-05-22 MED ORDER — HYDRALAZINE HCL 20 MG/ML IJ SOLN
5.0000 mg | Freq: Four times a day (QID) | INTRAMUSCULAR | Status: DC | PRN
  Administered 2016-05-22 – 2016-05-23 (×2): 5 mg via INTRAVENOUS
  Filled 2016-05-22 (×2): qty 1

## 2016-05-22 NOTE — Progress Notes (Signed)
*  PRELIMINARY RESULTS* Echocardiogram 2D Echocardiogram has been performed.  [name] [name] 05/22/2016, 10:30 AM

## 2016-05-22 NOTE — Progress Notes (Signed)
Pt manual BP 168/92, MD made aware and no further instructions received. Will continue to monitor.

## 2016-05-22 NOTE — Telephone Encounter (Signed)
No Januvia samples available currently - check back next week

## 2016-05-22 NOTE — Procedures (Signed)
[place] A. Merlene Laughter, MD     [email or web address]           HISTORY: The patient is a 66 year old male who presents with recurrent episodes of loss of consciousness and syncope. This is being done to evaluate for seizures as a cause of these episodes.  MEDICATIONS: Scheduled Meds: . aspirin EC  81 mg Oral Daily  . enoxaparin (LOVENOX) injection  60 mg Subcutaneous Q24H  . gabapentin  300 mg Oral BID  . insulin aspart  0-5 Units Subcutaneous QHS  . insulin aspart  0-9 Units Subcutaneous TID WC  . insulin aspart protamine- aspart  70 Units Subcutaneous BID WC  . isosorbide mononitrate  30 mg Oral Daily  . pantoprazole  40 mg Oral Daily  . sertraline  50 mg Oral Daily   Continuous Infusions: . sodium chloride 75 mL/hr at 05/22/16 1134   PRN Meds:.ALPRAZolam, Chlorphen-Phenyleph-ASA, fluticasone, hydrALAZINE, HYDROcodone-acetaminophen  Prior to Admission medications   Medication Sig Start Date End Date Taking? Authorizing Provider  ALPRAZolam Duanne Moron) 0.5 MG tablet TAKE ONE TABLET BY MOUTH TWICE DAILY AS NEEDED 03/30/16  Yes Fransisca Kaufmann Dettinger, MD  aspirin (ASPIRIN EC) 81 MG EC tablet Take by mouth.   Yes Historical Provider, MD  furosemide (LASIX) 20 MG tablet TAKE ONE TABLET BY MOUTH ONCE DAILY AS NEEDED Patient taking differently: Take 20 mg by mouth daily. TAKE ONE TABLET BY MOUTH ONCE DAILY AS NEEDED 09/17/15  Yes Claretta Fraise, MD  gabapentin (NEURONTIN) 300 MG capsule Take 1 capsule (300 mg total) by mouth 3 (three) times daily. Patient taking differently: Take 300 mg by mouth 2 (two) times daily.  09/17/15  Yes Claretta Fraise, MD  gemfibrozil (LOPID) 600 MG tablet Take 1 tablet (600 mg total) by mouth 2 (two) times daily before a meal. For cholesterol and triglycerides 02/20/16  Yes Claretta Fraise, MD  HYDROcodone-acetaminophen (NORCO) 10-325 MG tablet Take 1-2 tablets by mouth every 8 (eight) hours as needed for moderate pain.  01/10/16  Yes Historical Provider,  MD  isosorbide mononitrate (IMDUR) 30 MG 24 hr tablet TAKE ONE TABLET BY MOUTH ONCE DAILY 05/04/16  Yes Claretta Fraise, MD  Lactobacillus (PROBIOTIC ACIDOPHILUS PO) Take 1 capsule by mouth daily.   Yes Historical Provider, MD  meloxicam (MOBIC) 15 MG tablet Take 1 tablet (15 mg total) by mouth daily. 09/17/15  Yes Claretta Fraise, MD  metoprolol tartrate (LOPRESSOR) 25 MG tablet Take 0.5 tablets (12.5 mg total) by mouth 2 (two) times daily. 09/17/15  Yes Claretta Fraise, MD  mometasone (NASONEX) 50 MCG/ACT nasal spray Place 2 sprays into the nose daily. Patient taking differently: Place 2 sprays into the nose daily as needed (congestion).  06/13/15  Yes Claretta Fraise, MD  NOVOLIN 70/30 RELION (70-30) 100 UNIT/ML injection INJECT 70 UNITS SUBCUTANEOUSLY TWICE DAILY WITH  A  MEAL 01/22/16  Yes Claretta Fraise, MD  omeprazole (PRILOSEC) 40 MG capsule TAKE ONE CAPSULE BY MOUTH TWICE DAILY HALF AN HOUR BEFORE BREAKFAST AND SUPPER 09/17/15  Yes Claretta Fraise, MD  Red Yeast Rice Extract (RED YEAST RICE PO) Take 2 tablets by mouth daily.    Yes Historical Provider, MD  sertraline (ZOLOFT) 50 MG tablet Take 1 tablet (50 mg total) by mouth daily. 12/30/15  Yes [name], FNP  sitaGLIPtin (JANUVIA) 100 MG tablet Take 1 tablet (100 mg total) by mouth daily. 03/23/16  Yes Tammy Eckard, PharmD  telmisartan (MICARDIS) 40 MG tablet TAKE ONE TABLET BY MOUTH ONCE DAILY (REPLACING  LISINOPRIL)  09/17/15  Yes Claretta Fraise, MD  Insulin Pen Needle (PEN NEEDLES) 31G X 6 MM MISC Use to inject insulin two to three times a day 01/03/15   Cherre Robins, PharmD      ANALYSIS: A 16 channel recording using standard 10 20 measurements is conducted for 22 minutes. There is a low-voltage electrocortical activity are mostly 15 Hz although there is also 10 Hz activity observed. There is beta activity observed in the frontal areas. Awake and drowsy activities are observed. Photic stimulation is carried out without abnormal changes in the background  activity. Hyperventilation is not conducted. There is no focal or lateral slowing. There is no epileptiform activity is observed.   IMPRESSION: This is a normal recording of the awake and drowsy states.       Alvetta Hidrogo A. Merlene Laughter, M.D.  Diplomate, Tax adviser of Psychiatry and Neurology ( Neurology).

## 2016-05-22 NOTE — Progress Notes (Signed)
Order entered for event monitor per Arnold Long NP

## 2016-05-22 NOTE — Care Management Note (Signed)
Case Management Note  Patient Details  Name: [name] [name] MRN: [id number] Date of Birth: 01-12-1951  Subjective/Objective:    Patient adm from home with wife. Ind with ADL's. Has a cane PTA, uses occasionally. He has PCP, transportation to appointments, and no issues affording medications.             Action/Plan: Anticipate DC home with self care.    Expected Discharge Date:       05/22/2016           Expected Discharge Plan:  Home/Self Care  In-House Referral:  NA  Discharge planning Services  CM Consult  Post Acute Care Choice:  NA Choice offered to:  NA  DME Arranged:    DME Agency:     HH Arranged:    HH Agency:     Status of Service:  Completed, signed off  If discussed at [employer] of Stay Meetings, dates discussed:    Additional Comments:  Mei Suits, Chauncey Reading, RN 05/22/2016, 11:11 AM

## 2016-05-22 NOTE — Progress Notes (Signed)
EEG Completed; Results Pending  

## 2016-05-22 NOTE — Care Management Obs Status (Signed)
[place] NOTIFICATION   Patient Details  Name: [name] [name] MRN: [id number] Date of Birth: 09/21/1950   Medicare Observation Status Notification Given:  Yes    Ellisha Bankson, Chauncey Reading, RN 05/22/2016, 11:11 AM

## 2016-05-22 NOTE — Progress Notes (Signed)
PROGRESS NOTE    [name] [name]  [id number] DOB: 12/26/1950 DOA: 05/21/2016 PCP: Claretta Fraise, MD    Brief Narrative: [name] [name] is a 66 y.o. male with medical history significant of diabetes mellitus, hypertension, CAD, s/p PCI, arthritis, GERD, hiatal hernia, presents to ED with multiple episodes of presyncopal episodes over the last 8 to 9 months. As per the patient, the episode starts with his visual field turning  yellow, followed by blurry vision and he feels like passing out, dizziness, associated with nausea and some chest pressure.   Assessment & Plan:   Active Problems:   Syncope  Syncope: Admitted to telemetry for further evaluation.  MRI brain without contrast showed old stroke.  EEG and Echo done and pending.  Minimally elevated troponins probably from renal dysfunction.  No chest pain or chest pressure today.  Overnight telemetry is unremarkable except for PVC's   Acute renal failure: robably pre renal from dehydration.  Gentle hydration and repeat renal parameters are improving.   GERD: resume home meds.    CADs/p pci:  No chest pain. Resume home meds.   Diabetes Mellitus: CBG (last 3)   Recent Labs  05/22/16 0738 05/22/16 1124 05/22/16 1629  GLUCAP 198* 137* 163*    Resume SSI.     DVT prophylaxis: LOVENOX.  Code Status: full code.  Family Communication: wife at bedside.  Disposition Plan: pending further eval.    Consultants:   NONE.    Procedures: MRI brain  Echocardiogram.   EEG.  Antimicrobials: none.   Subjective: Reports he had another episode of nausea, dizziness and blurry vision. No loss of consciousness.   Objective: Vitals:   05/22/16 0557 05/22/16 0600 05/22/16 1410 05/22/16 1507  BP: (!) 163/79 (!) 168/92 (!) 150/72 (!) 125/56  Pulse: 67 70 65 60  Resp: 16  18 16   Temp: 97.8 F (36.6 C)  97.7 F (36.5 C) 98.5 F (36.9 C)  TempSrc: Oral  Oral Oral  SpO2: 97%  98%   Weight:      Height:         Intake/Output Summary (Last 24 hours) at 05/22/16 1703 Last data filed at 05/22/16 1400  Gross per 24 hour  Intake          1989.58 ml  Output                0 ml  Net          1989.58 ml   Filed Weights   05/21/16 1056  Weight: 128.4 kg (283 lb)    Examination:  General exam: Appears calm and comfortable  Respiratory system: Clear to auscultation. Respiratory effort normal. Cardiovascular system: S1 & S2 heard, RRR. No JVD, murmurs, rubs, gallops or clicks. No pedal edema. Gastrointestinal system: Abdomen is nondistended, soft and nontender. No organomegaly or masses felt. Normal bowel sounds heard. Central nervous system: Alert and oriented. No focal neurological deficits. Extremities: Symmetric 5 x 5 power. Skin: No rashes, lesions or ulcers Psychiatry: Judgement and insight appear normal. Mood & affect appropriate.     Data Reviewed: I have personally reviewed following labs and imaging studies  CBC:  Recent Labs Lab 05/21/16 1114  WBC 8.7  NEUTROABS 5.5  HGB 15.8  HCT 47.2  MCV 89.7  PLT 99991111   Basic Metabolic Panel:  Recent Labs Lab 05/21/16 1114 05/22/16 0315  NA 137 135  K 4.6 4.2  CL 97* 103  CO2 28 24  GLUCOSE 195* 221*  BUN 42*  37*  CREATININE 2.31* 1.85*  CALCIUM 9.5 8.9   GFR: Estimated Creatinine Clearance: 56.7 mL/min (by C-G formula based on SCr of 1.85 mg/dL (H)). Liver Function Tests:  Recent Labs Lab 05/21/16 1114  AST 26  ALT 31  ALKPHOS 61  BILITOT 0.8  PROT 7.5  ALBUMIN 4.1    Recent Labs Lab 05/21/16 1114  LIPASE 41   No results for input(s): AMMONIA in the last 168 hours. Coagulation Profile: No results for input(s): INR, PROTIME in the last 168 hours. Cardiac Enzymes:  Recent Labs Lab 05/21/16 1114 05/21/16 1556 05/21/16 2145 05/22/16 0315  TROPONINI 0.05* 0.05* 0.04* 0.04*   BNP (last 3 results) No results for input(s): PROBNP in the last 8760 hours. HbA1C: No results for input(s): HGBA1C in the  last 72 hours. CBG:  Recent Labs Lab 05/21/16 1646 05/21/16 2156 05/22/16 0738 05/22/16 1124 05/22/16 1629  GLUCAP 161* 307* 198* 137* 163*   Lipid Profile: No results for input(s): CHOL, HDL, LDLCALC, TRIG, CHOLHDL, LDLDIRECT in the last 72 hours. Thyroid Function Tests:  Recent Labs  05/21/16 1114  TSH 1.575   Anemia Panel: No results for input(s): VITAMINB12, FOLATE, FERRITIN, TIBC, IRON, RETICCTPCT in the last 72 hours. Sepsis Labs: No results for input(s): PROCALCITON, LATICACIDVEN in the last 168 hours.  No results found for this or any previous visit (from the past 240 hour(s)).       Radiology Studies: Dg Chest 2 View  Result Date: 05/21/2016 CLINICAL DATA:  Generalized weakness and body aches and mid chest pain for the past 3 days. Patient also notes right-sided facial swelling. History of coronary artery disease, former smoker. EXAM: CHEST  2 VIEW COMPARISON:  PA and lateral chest x-[place] of February 11, 2016 FINDINGS: The lungs are well-expanded. The interstitial markings are coarse though stable. There is no alveolar infiltrate or pleural effusion. The heart is normal in size. The pulmonary vascularity is not engorged. The mediastinum is normal in width. The bony thorax exhibits no acute abnormality. IMPRESSION: Chronic bronchitic changes. No pneumonia, CHF, nor other acute cardiopulmonary abnormality. Electronically Signed   By: David  Martinique M.D.   On: 05/21/2016 11:47   Ct Head Wo Contrast  Result Date: 05/21/2016 CLINICAL DATA:  Syncope.  Facial swelling.  Headache. EXAM: CT HEAD WITHOUT CONTRAST CT CERVICAL SPINE WITHOUT CONTRAST TECHNIQUE: Multidetector CT imaging of the head and cervical spine was performed following the standard protocol without intravenous contrast. Multiplanar CT image reconstructions of the cervical spine were also generated. COMPARISON:  None. FINDINGS: CT HEAD FINDINGS Brain: No evidence of acute infarction, hemorrhage, hydrocephalus,  extra-axial collection or mass lesion/mass effect. Vascular: No hyperdense vessel or unexpected calcification. Skull: Negative Sinuses/Orbits: Mild mucosal edema in the ethmoid sinuses. Normal orbital structures. Other: None CT CERVICAL SPINE FINDINGS Alignment: Normal Skull base and vertebrae: Negative for fracture or mass. No acute skeletal abnormality. ACDF with anterior plate C5-6 and [id number]. Soft tissues and spinal canal: Negative for mass or adenopathy. Carotid artery calcification bilaterally. Disc levels:  C2-3:  Mild disc bulging without stenosis C3-4:  Negative C4-5:  Negative C5-6: ACDF with anterior plate. No definite bony fusion. Possible pseudarthrosis. Mild facet degeneration. No significant stenosis C6-7: ACDF with solid interbody fusion. Mild spinal stenosis due to uncinate spurring. Mild foraminal narrowing bilaterally. C7-T1:  Negative Upper chest: Negative Other: None IMPRESSION: Negative CT of the head No acute abnormality in the cervical spine. ACDF C5-6 with probable pseudarthrosis. ACDF with solid fusion at C6-7. Mild spinal and foraminal  stenosis at C6-7 due to spurring. Electronically Signed   By: Franchot Gallo M.D.   On: 05/21/2016 12:26   Ct Cervical Spine Wo Contrast  Result Date: 05/21/2016 CLINICAL DATA:  Syncope.  Facial swelling.  Headache. EXAM: CT HEAD WITHOUT CONTRAST CT CERVICAL SPINE WITHOUT CONTRAST TECHNIQUE: Multidetector CT imaging of the head and cervical spine was performed following the standard protocol without intravenous contrast. Multiplanar CT image reconstructions of the cervical spine were also generated. COMPARISON:  None. FINDINGS: CT HEAD FINDINGS Brain: No evidence of acute infarction, hemorrhage, hydrocephalus, extra-axial collection or mass lesion/mass effect. Vascular: No hyperdense vessel or unexpected calcification. Skull: Negative Sinuses/Orbits: Mild mucosal edema in the ethmoid sinuses. Normal orbital structures. Other: None CT CERVICAL SPINE FINDINGS  Alignment: Normal Skull base and vertebrae: Negative for fracture or mass. No acute skeletal abnormality. ACDF with anterior plate C5-6 and D34-534. Soft tissues and spinal canal: Negative for mass or adenopathy. Carotid artery calcification bilaterally. Disc levels:  C2-3:  Mild disc bulging without stenosis C3-4:  Negative C4-5:  Negative C5-6: ACDF with anterior plate. No definite bony fusion. Possible pseudarthrosis. Mild facet degeneration. No significant stenosis C6-7: ACDF with solid interbody fusion. Mild spinal stenosis due to uncinate spurring. Mild foraminal narrowing bilaterally. C7-T1:  Negative Upper chest: Negative Other: None IMPRESSION: Negative CT of the head No acute abnormality in the cervical spine. ACDF C5-6 with probable pseudarthrosis. ACDF with solid fusion at C6-7. Mild spinal and foraminal stenosis at C6-7 due to spurring. Electronically Signed   By: Franchot Gallo M.D.   On: 05/21/2016 12:26   Mr Brain Wo Contrast  Result Date: 05/22/2016 CLINICAL DATA:  Multiple pre syncopal episodes. Generalized weakness, body aches, right-sided facial pain, and headache for 3 days. EXAM: MRI HEAD WITHOUT CONTRAST TECHNIQUE: Multiplanar, multiecho pulse sequences of the brain and surrounding structures were obtained without intravenous contrast. COMPARISON:  Head CT 05/21/2016 FINDINGS: Brain: There is no evidence of acute infarct, intracranial hemorrhage, mass, midline shift, or extra-axial fluid collection. The ventricles and sulci are normal. A chronic lacunar infarct is present in the left putamen. The brain is otherwise normal in signal for age. Vascular: Abnormal appearance of the distal left vertebral artery may reflect atherosclerosis and decreased flow. Other major intracranial vascular flow voids are preserved. Skull and upper cervical spine: No focal marrow lesion is identified. Sinuses/Orbits: Unremarkable orbits.  No significant sinus disease. Other: None. IMPRESSION: 1. No acute  intracranial abnormality. 2. Chronic left basal ganglia lacunar infarct. Electronically Signed   By: Logan Bores M.D.   On: 05/22/2016 07:40   US Renal  Result Date: 05/22/2016 CLINICAL DATA:  Inpatient with acute renal failure. EXAM: RENAL / URINARY TRACT ULTRASOUND COMPLETE COMPARISON:  08/17/2014 CT abdomen/pelvis FINDINGS: Right Kidney: Length: 12.5 cm. Mildly echogenic right kidney. No hydronephrosis. No right renal mass. Incidental diffuse hepatic steatosis in the visualized right liver lobe. Left Kidney: Length: 13.4 cm. Mildly echogenic left kidney. No hydronephrosis. No left renal mass. Bladder: Appears normal for degree of bladder distention. IMPRESSION: 1. No hydronephrosis. 2. Mildly echogenic normal size kidneys, indicating nonspecific renal parenchymal disease of uncertain chronicity. 3. Normal bladder. 4. Incidental diffuse hepatic steatosis. Electronically Signed   By: Ilona Sorrel M.D.   On: 05/22/2016 07:55   US Carotid Bilateral  Result Date: 05/21/2016 EXAM: BILATERAL CAROTID DUPLEX ULTRASOUND TECHNIQUE: Arrellano scale imaging, color Doppler and duplex ultrasound were performed of bilateral carotid and vertebral arteries in the neck. COMPARISON:  CT 05/21/2016, ultrasound 11/22/2013 FINDINGS: Criteria: Quantification  of carotid stenosis is based on velocity parameters that correlate the residual internal carotid diameter with NASCET-based stenosis levels, using the diameter of the distal internal carotid lumen as the denominator for stenosis measurement. The following velocity measurements were obtained: RIGHT ICA:  104 cm/sec CCA:  99 cm/sec SYSTOLIC ICA/CCA RATIO:  1.3 DIASTOLIC ICA/CCA RATIO:  1.9 ECA:  152 cm/sec LEFT ICA:  116 cm/sec CCA:  89 cm/sec SYSTOLIC ICA/CCA RATIO:  1.1 DIASTOLIC ICA/CCA RATIO:  1.2 ECA:  189 cm/sec RIGHT CAROTID ARTERY: Minor echogenic shadowing plaque formation. Nohemodynamically significant right ICA stenosis, velocity elevation,or turbulent flow. Degree of  narrowing less than 50% RIGHT VERTEBRAL ARTERY:  Antegrade flow present LEFT CAROTID ARTERY: Similar scattered minor echogenic plaqueformation. No hemodynamically significant left ICA stenosis,velocity elevation, or turbulent flow. LEFT VERTEBRAL ARTERY:  Antegrade flow present. IMPRESSION: Minor carotid atherosclerosis. No hemodynamically significant ICAstenosis by ultrasound. Electronically Signed   By: Donavan Foil M.D.   On: 05/21/2016 18:16   Ct Maxillofacial Wo Contrast  Result Date: 05/21/2016 CLINICAL DATA:  Generalized weakness, right side facial EXAM: CT MAXILLOFACIAL WITHOUT CONTRAST TECHNIQUE: Multidetector CT imaging of the maxillofacial structures was performed. Multiplanar CT image reconstructions were also generated. A small metallic BB was placed on the right temple in order to reliably differentiate right from left. COMPARISON:  CT head same day FINDINGS: Osseous: No facial fractures are noted. There is right deviation of nasal bony septum. Orbits: No intraorbital hematoma. Bilateral eye globe symmetrical in appearance. Coronal images shows no orbital rim or orbital floor fracture. Sinuses: Mild mucosal thickening bilateral ethmoid air cells. Coronal images shows patent semilunar canal bilaterally. There is mild mucosal thickening superior aspect of maxillary sinus bilaterally. Soft tissues: No facial fluid collection. The nasal turbinates are unremarkable. Sagittal images shows patent nasopharyngeal and oropharyngeal airway. Limited intracranial: Unremarkable IMPRESSION: 1. No facial fractures of fluid collects mild mucosal thickening bilateral ethmoid air cells. Mild mucosal thickening superior aspect bilateral maxillary sinus. 2. There is right deviation of nasal bony septum. Bilateral semilunar canal is patent. 3. No intraorbital hematoma. No orbital rim or orbital floor fracture. No facial fluid collection. Electronically Signed   By: Lahoma Crocker M.D.   On: 05/21/2016 12:41         Scheduled Meds: . aspirin EC  81 mg Oral Daily  . enoxaparin (LOVENOX) injection  60 mg Subcutaneous Q24H  . gabapentin  300 mg Oral BID  . insulin aspart  0-5 Units Subcutaneous QHS  . insulin aspart  0-9 Units Subcutaneous TID WC  . insulin aspart protamine- aspart  70 Units Subcutaneous BID WC  . isosorbide mononitrate  30 mg Oral Daily  . pantoprazole  40 mg Oral Daily  . sertraline  50 mg Oral Daily   Continuous Infusions: . sodium chloride 75 mL/hr at 05/22/16 1134     LOS: 0 days    Time spent: 30 minutes    Takeo Harts, MD [facility] Pager [phone]  If 7PM-7AM, please contact night-coverage [email or web address] Password [email or web address] 05/22/2016, 5:03 PM

## 2016-05-23 ENCOUNTER — Observation Stay (HOSPITAL_COMMUNITY): Payer: Medicare Other

## 2016-05-23 DIAGNOSIS — I6522 Occlusion and stenosis of left carotid artery: Secondary | ICD-10-CM | POA: Diagnosis not present

## 2016-05-23 DIAGNOSIS — N179 Acute kidney failure, unspecified: Secondary | ICD-10-CM | POA: Insufficient documentation

## 2016-05-23 DIAGNOSIS — R55 Syncope and collapse: Secondary | ICD-10-CM | POA: Diagnosis not present

## 2016-05-23 LAB — HEMOGLOBIN A1C
Hgb A1c MFr Bld: 10 % — ABNORMAL HIGH (ref 4.8–5.6)
Mean Plasma Glucose: 240 mg/dL

## 2016-05-23 LAB — TROPONIN I: Troponin I: 0.04 ng/mL (ref <0.03)

## 2016-05-23 LAB — BASIC METABOLIC PANEL
Anion gap: 7 (ref 5–15)
BUN: 27 mg/dL — ABNORMAL HIGH (ref 6–20)
CO2: 23 mmol/L (ref 22–32)
Calcium: 9.7 mg/dL (ref 8.9–10.3)
Chloride: 101 mmol/L (ref 101–111)
Creatinine, Ser: 1.19 mg/dL (ref 0.61–1.24)
GFR calc Af Amer: >60 mL/min (ref >60)
GFR calc non Af Amer: >60 mL/min (ref >60)
Glucose, Bld: 282 mg/dL — ABNORMAL HIGH (ref 65–99)
Potassium: 4.6 mmol/L (ref 3.5–5.1)
Sodium: 131 mmol/L — ABNORMAL LOW (ref 135–145)

## 2016-05-23 LAB — GLUCOSE, CAPILLARY
Glucose-Capillary: 284 mg/dL — ABNORMAL HIGH (ref 65–99)
Glucose-Capillary: 168 mg/dL — ABNORMAL HIGH (ref 65–99)
Glucose-Capillary: 266 mg/dL — ABNORMAL HIGH (ref 65–99)
Glucose-Capillary: 184 mg/dL — ABNORMAL HIGH (ref 65–99)

## 2016-05-23 MED ORDER — LORAZEPAM 2 MG/ML IJ SOLN
2.0000 mg | Freq: Once | INTRAMUSCULAR | Status: AC
  Administered 2016-05-23: 2 mg via INTRAVENOUS

## 2016-05-23 MED ORDER — AMLODIPINE BESYLATE 10 MG PO TABS
10.0000 mg | ORAL_TABLET | Freq: Every day | ORAL | Status: DC
  Administered 2016-05-23 – 2016-05-25 (×3): 10 mg via ORAL
  Filled 2016-05-23 (×2): qty 1
  Filled 2016-05-23: qty 2

## 2016-05-23 MED ORDER — LORAZEPAM 2 MG/ML IJ SOLN
INTRAMUSCULAR | Status: AC
  Filled 2016-05-23: qty 1

## 2016-05-23 MED ORDER — IRBESARTAN 75 MG PO TABS
75.0000 mg | ORAL_TABLET | Freq: Every day | ORAL | Status: DC
Start: 2016-05-23 — End: 2016-05-25
  Administered 2016-05-23 – 2016-05-25 (×3): 75 mg via ORAL
  Filled 2016-05-23 (×3): qty 1

## 2016-05-23 NOTE — Progress Notes (Signed)
D- Pt complains of SOB and chest pressure shortly after receiving hydralazine per orders for elevated BP.  Vitals remain within his normal limits with oxygen saturations of 97%.  He received a dose last night for the same and reports this happed with that dose as well but he did not report it.  He reports with it happening again it alarmed him.  A-  An order was placed for an EKG per protocol and Dr. Karleen Hampshire was notified via text page.    R- EKG has been performed and patient is in bed and denies new complaints at this time.  He reports pressure is improved but no gone and the shortness of breath is improving.  I have reported off to the oncoming nurse and she is aware of the above.

## 2016-05-23 NOTE — Progress Notes (Signed)
[facility] here to transport patient to [facility] in [place], alert and oriented, VSS, # 20 right forearm flushes without difficulty, no complaints/ or concerns voiced, wife present, all personal belongings taking by wife, patient walked to stretcher, tele box removed as [facility] put on theirs.

## 2016-05-23 NOTE — Progress Notes (Signed)
PROGRESS NOTE    [name] [name]  [id number] DOB: [birth year] DOA: 05/21/2016 PCP: Claretta Fraise, MD    Brief Narrative: [name] [name] is a 66 y.o. male with medical history significant of diabetes mellitus, hypertension, CAD, s/p PCI, arthritis, GERD, hiatal hernia, presents to ED with multiple episodes of presyncopal episodes over the last 8 to 9 months. As per the patient, the episode starts with his visual field turning  yellow, followed by blurry vision and he feels like passing out, dizziness, associated with nausea and some chest pressure.   Assessment & Plan:   Active Problems:   Syncope  Syncope: Admitted to telemetry for further evaluation. One episode of pre syncope on 1/5 with some blurry vision and headache.   MRI brain without contrast showed old stroke. MRA of the head reveals,  No visible flow in the left V4 segment. High-grade atheromatous narrowings at the left ICA anterior genu, bilateral M2 branches, and bilateral P2 branches. MRA of the neck shows Thready flow within the left vertebral artery which is new compared to 2015 imaging. Suspect a high-grade proximal left subclavian stenosis favoring steal phenomenon. Dissection is the main differential consideration. CTA is recommended to further evaluate and  Cervical carotid atherosclerosis with ~ 40% proximal left ICA stenosis.  EEG is a normal recording of the awake and drowsy states.   Echo shows wall thickness of the LV is increased in a pattern of mild LVH. LV systolic function is [id number]. Wall motion is normal, no regional wall abnormalities. Grade 2 diastolic dysfunction. dopplers show high ventricular filling pressure.    Minimally elevated troponins probably from renal dysfunction.  Chest pressure today after he got IV hydralazine.  Overnight telemetry is unremarkable except for [facility] consulted, discussed with Dr Sallyanne Kuster, and plan to transfer the patient for possible Nuclear stress and loop recorder  placement.  Please call cardiology when patient gets to the floor.  Neurology will be consulted for the abnormal MRA of the neck.    Acute renal failure: Probably pre renal from dehydration.  Gentle hydration and repeat renal parameters are improving.  Stopped IV fluids.   GERD: resume home meds.    CADs/p pci:  No chest pain. Resume home meds.   Diabetes Mellitus: CBG (last 3)   Recent Labs  05/22/16 2126 05/23/16 0745 05/23/16 1212  GLUCAP 227* 284* 168*    Resume SSI.  hgba1c is     DVT prophylaxis: LOVENOX.  Code Status: full code.  Family Communication: wife at bedside.  Disposition Plan: transfer to [facility].   Consultants:   NONE.    Procedures: MRI brain  Echocardiogram.   EEG.  Antimicrobials: none.   Subjective: One episode of chest pressure this am. Resolved spontaneously.  Objective: Vitals:   05/22/16 1507 05/22/16 2129 05/23/16 0623 05/23/16 0710  BP: (!) 125/56 (!) 196/80 (!) 185/78 (!) 181/89  Pulse: 60 69 72 72  Resp: 16 16 18 20   Temp: 98.5 F (36.9 C) 97.8 F (36.6 C) 97.3 F (36.3 C)   TempSrc: Oral Oral Oral   SpO2:  97% 98% 97%  Weight:      Height:        Intake/Output Summary (Last 24 hours) at 05/23/16 1350 Last data filed at 05/22/16 1700  Gross per 24 hour  Intake            662.5 ml  Output                0 ml  Net            662.5 ml   Filed Weights   05/21/16 1056  Weight: 128.4 kg (283 lb)    Examination:  General exam: Appears calm and comfortable  Respiratory system: Clear to auscultation. Respiratory effort normal. Cardiovascular system: S1 & S2 heard, RRR. No JVD, murmurs, rubs, gallops or clicks. No pedal edema. Gastrointestinal system: Abdomen is nondistended, soft and nontender. No organomegaly or masses felt. Normal bowel sounds heard. Central nervous system: Alert and oriented. No focal neurological deficits. Extremities: Symmetric 5 x 5 power. Skin: No rashes, lesions or  ulcers Psychiatry: Judgement and insight appear normal. Mood & affect appropriate.     Data Reviewed: I have personally reviewed following labs and imaging studies  CBC:  Recent Labs Lab 05/21/16 1114  WBC 8.7  NEUTROABS 5.5  HGB 15.8  HCT 47.2  MCV 89.7  PLT 99991111   Basic Metabolic Panel:  Recent Labs Lab 05/21/16 1114 05/22/16 0315 05/23/16 0654  NA 137 135 131*  K 4.6 4.2 4.6  CL 97* 103 101  CO2 28 24 23   GLUCOSE 195* 221* 282*  BUN 42* 37* 27*  CREATININE 2.31* 1.85* 1.19  CALCIUM 9.5 8.9 9.7   GFR: Estimated Creatinine Clearance: 88.1 mL/min (by C-G formula based on SCr of 1.19 mg/dL). Liver Function Tests:  Recent Labs Lab 05/21/16 1114  AST 26  ALT 31  ALKPHOS 61  BILITOT 0.8  PROT 7.5  ALBUMIN 4.1    Recent Labs Lab 05/21/16 1114  LIPASE 41   No results for input(s): AMMONIA in the last 168 hours. Coagulation Profile: No results for input(s): INR, PROTIME in the last 168 hours. Cardiac Enzymes:  Recent Labs Lab 05/21/16 1114 05/21/16 1556 05/21/16 2145 05/22/16 0315 05/23/16 0732  TROPONINI 0.05* 0.05* 0.04* 0.04* 0.04*   BNP (last 3 results) No results for input(s): PROBNP in the last 8760 hours. HbA1C:  Recent Labs  05/21/16 1114  HGBA1C 10.0*   CBG:  Recent Labs Lab 05/22/16 1124 05/22/16 1629 05/22/16 2126 05/23/16 0745 05/23/16 1212  GLUCAP 137* 163* 227* 284* 168*   Lipid Profile: No results for input(s): CHOL, HDL, LDLCALC, TRIG, CHOLHDL, LDLDIRECT in the last 72 hours. Thyroid Function Tests:  Recent Labs  05/21/16 1114  TSH 1.575   Anemia Panel: No results for input(s): VITAMINB12, FOLATE, FERRITIN, TIBC, IRON, RETICCTPCT in the last 72 hours. Sepsis Labs: No results for input(s): PROCALCITON, LATICACIDVEN in the last 168 hours.  No results found for this or any previous visit (from the past 240 hour(s)).       Radiology Studies: Mr Virgel Paling [id number] Contrast  Result Date: 05/23/2016 CLINICAL  DATA:  Syncope. EXAM: MRA HEAD WITHOUT CONTRAST TECHNIQUE: Angiographic images of the Circle of Willis were obtained using MRA technique without intravenous contrast. COMPARISON:  None. FINDINGS: No flow seen within the left vertebral artery, which is present based on CT imaging and cervical spine MRI 10/29/2013. MRA neck reported separately. Patent right PICA and left AICA. Right posterior communicating artery. There is bilateral atherosclerotic irregularity of the bilateral PCA is with bilateral high-grade P2 segment stenoses, more proximal on the left. Symmetric carotid arteries. Moderate to advanced narrowing of the left ICA anterior genu. Mild narrowing of the right ICA anterior genu. No major branch occlusion. Bilateral proximal M2 branch moderate to advanced narrowings. IMPRESSION: 1. No visible flow in the left V4 segment. See MRA neck that is reported separately. 2. High-grade atheromatous narrowings at the  left ICA anterior genu, bilateral M2 branches, and bilateral P2 branches. Electronically Signed   By: Monte Fantasia M.D.   On: 05/23/2016 12:48   [name] Neck Wo Contrast  Result Date: 05/23/2016 CLINICAL DATA:  Syncopal episode. EXAM: MRA NECK WITHOUT CONTRAST TECHNIQUE: Angiographic images of the neck were obtained using MRA technique without intravenous contrast. COMPARISON:  None. FINDINGS: Time-of-flight imaging was performed with limited signal in the proximal great vessels. Still, there is suggestion of a high-grade proximal left subclavian stenosis. Intermittent, thready flow seen in the left vertebral artery, which is not seen by the level of the C1 ring. This is a new finding when correlated with cervical spine MRI 10/29/2013 when flow voids were nearly symmetric and normal. Additionally, on ultrasound from 2 days ago, there may have been early reversal of systolic flow on left vertebral insonnation. The right vertebral artery is smooth and widely patent where there is good signal.  Bilateral cervical carotid bifurcation atherosclerosis with proximal left ICA narrowing measuring 40%. IMPRESSION: 1. Thready flow within the left vertebral artery which is new compared to 2015 imaging. Suspect a high-grade proximal left subclavian stenosis favoring steal phenomenon. Dissection is the main differential consideration. CTA is recommended to further evaluate. 2. Cervical carotid atherosclerosis with ~ 40% proximal left ICA stenosis. Electronically Signed   By: Monte Fantasia M.D.   On: 05/23/2016 13:00   Mr Brain Wo Contrast  Result Date: 05/22/2016 CLINICAL DATA:  Multiple pre syncopal episodes. Generalized weakness, body aches, right-sided facial pain, and headache for 3 days. EXAM: MRI HEAD WITHOUT CONTRAST TECHNIQUE: Multiplanar, multiecho pulse sequences of the brain and surrounding structures were obtained without intravenous contrast. COMPARISON:  Head CT 05/21/2016 FINDINGS: Brain: There is no evidence of acute infarct, intracranial hemorrhage, mass, midline shift, or extra-axial fluid collection. The ventricles and sulci are normal. A chronic lacunar infarct is present in the left putamen. The brain is otherwise normal in signal for age. Vascular: Abnormal appearance of the distal left vertebral artery may reflect atherosclerosis and decreased flow. Other major intracranial vascular flow voids are preserved. Skull and upper cervical spine: No focal marrow lesion is identified. Sinuses/Orbits: Unremarkable orbits.  No significant sinus disease. Other: None. IMPRESSION: 1. No acute intracranial abnormality. 2. Chronic left basal ganglia lacunar infarct. Electronically Signed   By: Logan Bores M.D.   On: 05/22/2016 07:40   US Renal  Result Date: 05/22/2016 CLINICAL DATA:  Inpatient with acute renal failure. EXAM: RENAL / URINARY TRACT ULTRASOUND COMPLETE COMPARISON:  08/17/2014 CT abdomen/pelvis FINDINGS: Right Kidney: Length: 12.5 cm. Mildly echogenic right kidney. No hydronephrosis. No  right renal mass. Incidental diffuse hepatic steatosis in the visualized right liver lobe. Left Kidney: Length: 13.4 cm. Mildly echogenic left kidney. No hydronephrosis. No left renal mass. Bladder: Appears normal for degree of bladder distention. IMPRESSION: 1. No hydronephrosis. 2. Mildly echogenic normal size kidneys, indicating nonspecific renal parenchymal disease of uncertain chronicity. 3. Normal bladder. 4. Incidental diffuse hepatic steatosis. Electronically Signed   By: Ilona Sorrel M.D.   On: 05/22/2016 07:55   US Carotid Bilateral  Result Date: 05/21/2016 EXAM: BILATERAL CAROTID DUPLEX ULTRASOUND TECHNIQUE: Sundy scale imaging, color Doppler and duplex ultrasound were performed of bilateral carotid and vertebral arteries in the neck. COMPARISON:  CT 05/21/2016, ultrasound 11/22/2013 FINDINGS: Criteria: Quantification of carotid stenosis is based on velocity parameters that correlate the residual internal carotid diameter with NASCET-based stenosis levels, using the diameter of the distal internal carotid lumen as the denominator for stenosis  measurement. The following velocity measurements were obtained: RIGHT ICA:  104 cm/sec CCA:  99 cm/sec SYSTOLIC ICA/CCA RATIO:  1.3 DIASTOLIC ICA/CCA RATIO:  1.9 ECA:  152 cm/sec LEFT ICA:  116 cm/sec CCA:  89 cm/sec SYSTOLIC ICA/CCA RATIO:  1.1 DIASTOLIC ICA/CCA RATIO:  1.2 ECA:  189 cm/sec RIGHT CAROTID ARTERY: Minor echogenic shadowing plaque formation. Nohemodynamically significant right ICA stenosis, velocity elevation,or turbulent flow. Degree of narrowing less than 50% RIGHT VERTEBRAL ARTERY:  Antegrade flow present LEFT CAROTID ARTERY: Similar scattered minor echogenic plaqueformation. No hemodynamically significant left ICA stenosis,velocity elevation, or turbulent flow. LEFT VERTEBRAL ARTERY:  Antegrade flow present. IMPRESSION: Minor carotid atherosclerosis. No hemodynamically significant ICAstenosis by ultrasound. Electronically Signed   By: Donavan Foil M.D.   On: 05/21/2016 18:16        Scheduled Meds: . amLODipine  10 mg Oral Daily  . aspirin EC  81 mg Oral Daily  . enoxaparin (LOVENOX) injection  60 mg Subcutaneous Q24H  . gabapentin  300 mg Oral BID  . insulin aspart  0-5 Units Subcutaneous QHS  . insulin aspart  0-9 Units Subcutaneous TID WC  . insulin aspart protamine- aspart  70 Units Subcutaneous BID WC  . irbesartan  75 mg Oral Daily  . isosorbide mononitrate  30 mg Oral Daily  . pantoprazole  40 mg Oral Daily  . sertraline  50 mg Oral Daily   Continuous Infusions:    LOS: 0 days    Time spent: 30 minutes    Ariv Penrod, MD [facility] Pager [phone]  If 7PM-7AM, please contact night-coverage [email or web address] Password [email or web address] 05/23/2016, 1:50 PM

## 2016-05-23 NOTE — Progress Notes (Signed)
[facility] called to set up transport for pt to [facility].

## 2016-05-24 ENCOUNTER — Observation Stay (HOSPITAL_COMMUNITY): Payer: Medicare Other

## 2016-05-24 ENCOUNTER — Encounter (HOSPITAL_COMMUNITY): Payer: Self-pay | Admitting: Radiology

## 2016-05-24 DIAGNOSIS — R55 Syncope and collapse: Secondary | ICD-10-CM | POA: Diagnosis not present

## 2016-05-24 DIAGNOSIS — I6509 Occlusion and stenosis of unspecified vertebral artery: Secondary | ICD-10-CM | POA: Diagnosis not present

## 2016-05-24 DIAGNOSIS — G459 Transient cerebral ischemic attack, unspecified: Secondary | ICD-10-CM

## 2016-05-24 DIAGNOSIS — I6522 Occlusion and stenosis of left carotid artery: Secondary | ICD-10-CM | POA: Diagnosis not present

## 2016-05-24 LAB — GLUCOSE, CAPILLARY
Glucose-Capillary: 194 mg/dL — ABNORMAL HIGH (ref 65–99)
Glucose-Capillary: 208 mg/dL — ABNORMAL HIGH (ref 65–99)
Glucose-Capillary: 203 mg/dL — ABNORMAL HIGH (ref 65–99)
Glucose-Capillary: 317 mg/dL — ABNORMAL HIGH (ref 65–99)
Glucose-Capillary: 232 mg/dL — ABNORMAL HIGH (ref 65–99)

## 2016-05-24 MED ORDER — IOPAMIDOL (ISOVUE-370) INJECTION 76%
INTRAVENOUS | Status: AC
  Administered 2016-05-24: 50 mL
  Filled 2016-05-24: qty 50

## 2016-05-24 NOTE — Consult Note (Signed)
Reason for Consult:syncope and remote stroke  Referring Physician: [name] is an 66 y.o. male.   HPI: The patient is a 66 yo man with multiple medical problems, including CAD, prior stroke, DM, HTN heart disease, and GERD was admitted and transferred from [facility] for evaluation of recurrent syncope and abnormal CT scan. The patient has had a couple of spells which are not associated with upright posture and no loss of bowel or bladder continence where he will start go out. He states the room color starts to fade and turns yellow. The patient has not had any neuro changes.   PMH: Past Medical History:  Diagnosis Date  . AODM   . Arthritis   . CAD (coronary artery disease)    2010  LAD 50%, RCA 100%.  DES to RCA.  EF 55%  . Cellulitis and abscess rt groin   . CHEST PAIN-UNSPECIFIED   . Diabetes mellitus   . Disorders of iron metabolism   . DISORDERS OF IRON METABOLISM   . GERD (gastroesophageal reflux disease)   . Hyperlipidemia   . Hypertension   . Low serum testosterone level   . Medically noncompliant   . MURMUR     PSHX: Past Surgical History:  Procedure Laterality Date  . BACK SURGERY  2015   ACDF by Dr. [name]  . COLONOSCOPY N/A 10/01/2014   Procedure: COLONOSCOPY;  Surgeon: Daneil Dolin, MD;  Location: [facility] ENDO [facility]ITE;  Service: Endoscopy;  Laterality: N/A;  815  . CORONARY STENT PLACEMENT  2000   By Dr. Olevia Perches  . ESOPHAGOGASTRODUODENOSCOPY     esophagus stretched remotely at [facility]  . ESOPHAGOGASTRODUODENOSCOPY N/A 10/01/2014   Procedure: ESOPHAGOGASTRODUODENOSCOPY (EGD);  Surgeon: Daneil Dolin, MD;  Location: [facility] ENDO [facility]ITE;  Service: Endoscopy;  Laterality: N/A;  . HERNIA REPAIR  [id number]   umbilical  . LESION REMOVAL     Lip and hand   . NECK SURGERY      FAMHX: Family History  Problem Relation Age of Onset  . Diabetes Father   . Valvular heart disease Father   . Arthritis Father   . Heart disease Father   . Alzheimer's disease Mother   .  Hyperlipidemia Mother   . Hypertension Mother   . Cancer Mother     lung  . Arthritis Mother   . Arthritis Sister     rheumatoid  . Diabetes Sister   . Hypertension Sister   . Hyperlipidemia Sister   . Dementia Maternal Aunt   . Dementia Maternal Uncle   . Heart disease Maternal Uncle   . Cancer Paternal Uncle     stomach  . Colon cancer Neg Hx   . Liver disease Neg Hx     Social History:  reports that he has been smoking Cigarettes.  He has a 12.75 pack-year smoking history. He has never used smokeless tobacco. He reports that he does not drink alcohol or use drugs.  Allergies:  Allergies  Allergen Reactions  . Shellfish Allergy Anaphylaxis    Tongue swelling, hives  . Ace Inhibitors Other (See Comments) and Cough    CKD, renal failure   . Invokana [Canagliflozin] Other (See Comments)    Syncope / dehydration  . Metformin And Related Itching  . Pravastatin Sodium     myalgias  . Fenofibrate     Body aches  . Horse-Derived Products     REACTION: Rash  . Iodine     ????  .  Lexapro [Escitalopram Oxalate]     Buzzing in ears,headache, felt like a zombie  . Lisinopril Cough  . Crestor [Rosuvastatin] Other (See Comments)    Myalgias   . Lipitor [Atorvastatin] Other (See Comments)    myalgias    Medications: I have reviewed the patient's current medications.  [name] Head Wo Contrast  Result Date: 05/23/2016 CLINICAL DATA:  Syncope. EXAM: MRA HEAD WITHOUT CONTRAST TECHNIQUE: Angiographic images of the Circle of Willis were obtained using MRA technique without intravenous contrast. COMPARISON:  None. FINDINGS: No flow seen within the left vertebral artery, which is present based on CT imaging and cervical spine MRI 10/29/2013. MRA neck reported separately. Patent right PICA and left AICA. Right posterior communicating artery. There is bilateral atherosclerotic irregularity of the bilateral PCA is with bilateral high-grade P2 segment stenoses, more proximal on the left.  Symmetric carotid arteries. Moderate to advanced narrowing of the left ICA anterior genu. Mild narrowing of the right ICA anterior genu. No major branch occlusion. Bilateral proximal M2 branch moderate to advanced narrowings. IMPRESSION: 1. No visible flow in the left V4 segment. See MRA neck that is reported separately. 2. High-grade atheromatous narrowings at the left ICA anterior genu, bilateral M2 branches, and bilateral P2 branches. Electronically Signed   By: Monte Fantasia M.D.   On: 05/23/2016 12:48   [name] Neck Wo Contrast  Result Date: 05/23/2016 CLINICAL DATA:  Syncopal episode. EXAM: MRA NECK WITHOUT CONTRAST TECHNIQUE: Angiographic images of the neck were obtained using MRA technique without intravenous contrast. COMPARISON:  None. FINDINGS: Time-of-flight imaging was performed with limited signal in the proximal great vessels. Still, there is suggestion of a high-grade proximal left subclavian stenosis. Intermittent, thready flow seen in the left vertebral artery, which is not seen by the level of the C1 ring. This is a new finding when correlated with cervical spine MRI 10/29/2013 when flow voids were nearly symmetric and normal. Additionally, on ultrasound from 2 days ago, there may have been early reversal of systolic flow on left vertebral insonnation. The right vertebral artery is smooth and widely patent where there is good signal. Bilateral cervical carotid bifurcation atherosclerosis with proximal left ICA narrowing measuring 40%. IMPRESSION: 1. Thready flow within the left vertebral artery which is new compared to 2015 imaging. Suspect a high-grade proximal left subclavian stenosis favoring steal phenomenon. Dissection is the main differential consideration. CTA is recommended to further evaluate. 2. Cervical carotid atherosclerosis with ~ 40% proximal left ICA stenosis. Electronically Signed   By: Monte Fantasia M.D.   On: 05/23/2016 13:00    ROS  As stated in the HPI and negative  for all other systems.  Physical Exam  Vitals:Blood pressure (!) 157/87, pulse 67, temperature 97.8 F (36.6 C), temperature source Oral, resp. rate 18, height 6\' 2"  (1.88 m), weight 282 lb (127.9 kg), SpO2 98 %.  Well appearing NAD HEENT: Unremarkable Neck:  No JVD, no thyromegally Lymphatics:  No adenopathy Back:  No CVA tenderness Lungs:  Clear HEART:  Regular rate rhythm, no murmurs, no rubs, no clicks Abd:  Flat, positive bowel sounds, no organomegally, no rebound, no guarding Ext:  2 plus pulses, no edema, no cyanosis, no clubbing Skin:  No rashes no nodules Neuro:  CN II through XII intact, motor grossly intact  Assessment/Plan: 1. Syncope - the etiology is unclear. It could be very atypical autonomic dysfunction but sounds more to me like sinus node dysfunction. Would avoid sinus nodal blocking drugs and will plan to place  an ILR tomorrow. 2. Remote stroke - CT scan reviewed. ILR is indicated. 3. CAD - he denies anginal symptoms. No change in meds. I do not think this patient needs a stress test. Will cancel NPO.  [name] Overlie TaylorMD 05/24/2016, 9:31 AM

## 2016-05-24 NOTE — Progress Notes (Addendum)
STROKE TEAM PROGRESS NOTE   HISTORY OF PRESENT ILLNESS (per record) [name] [name] is an 66 y.o. male who initially presented to [facility] for evaluation of multiple presyncopal episodes over the last 8 to 9 months. He has a PMHx of DM, HTN, CAD s/p PCI, arthritis, GERD, hiatal hernia.   Per initial H and P at [facility]: "As per the patient, the episode starts with his visual field all yellow, followed by blurry vision and he feels like passing out and that's when he sits on the floor. He reports it happens when he is laying down. It will be followed by sharp pain behind his right eye and right sided headache. He also reports some chest pressure and associated with nausea , palpitations. He reports he never completely loses consciousness. He was never worked up in the last few months for these episodes. On arrival to ED, he underwent ct of the head and neck , which showed some pseudo arthrosis and foraminal stenosis, but otherwise unremarkable."   He was admitted to telemetry for further evaluation. At OSH, CT maxillofacial was unremarkable. Carotid ultrasound did not show any significant ICA stenosis. EKG showed sinus bradycardia. He had one episode of presyncope on 1/5 with some blurry vision and headache.   MRI brain without contrast performed at OSH showed old stroke. MRA of the head revealed no visible flow in the left V4 segment; there were high-grade atheromatous narrowings at the left ICA anterior genu, bilateral M2 branches, and bilateral P2 branches. MRA of the neck showed thready flow within the left vertebral artery which was new compared to 2015 imaging; suspected a high-grade proximal left subclavian stenosis favoring steal phenomenon; dissection also was a consideration. Findings consistent with cervical carotid atherosclerosis with ~ 40% proximal left ICA stenosis were also noted. CTA was recommended to further evaluate.  EEG during the awake and drowsy states was normal.    Echo showed increased wall thickness of the LV in a pattern of mild LVH. LV systolic function was [id number]. Wall motion was normal, with no regional wall abnormalities. There was grade 2 diastolic dysfunction. High ventricular filling pressure was also noted.  He had minimally elevated troponins probably from renal dysfunction. Overnight telemetry on his last day at OSH was unremarkable except for PVC's.  Cardiology was consulted while at [facility]. The plan was to transfer the patient to [facility] for possible nuclear stress test and loop recorder placement.   Neurology was to be consulted for the abnormal MRA of the neck.  SUBJECTIVE (INTERVAL HISTORY) He feels "great". Wife at bedside. cta ordered.    OBJECTIVE Temp:  [97.6 F (36.4 C)-97.9 F (36.6 C)] 97.8 F (36.6 C) (01/07 0529) Pulse Rate:  [67-80] 67 (01/07 0529) Cardiac Rhythm: Normal sinus rhythm (01/06 2330) Resp:  [13-18] 18 (01/07 0529) BP: (130-194)/(68-87) 157/87 (01/07 0529) SpO2:  [97 %-98 %] 98 % (01/07 0529) Weight:  [127.9 kg (282 lb)] 127.9 kg (282 lb) (01/07 0529)  CBC:  Recent Labs Lab 05/21/16 1114  WBC 8.7  NEUTROABS 5.5  HGB 15.8  HCT 47.2  MCV 89.7  PLT 99991111    Basic Metabolic Panel:  Recent Labs Lab 05/22/16 0315 05/23/16 0654  NA 135 131*  K 4.2 4.6  CL 103 101  CO2 24 23  GLUCOSE 221* 282*  BUN 37* 27*  CREATININE 1.85* 1.19  CALCIUM 8.9 9.7    Lipid Panel:    Component Value Date/Time   CHOL 239 (H) 02/19/2016  0904   TRIG 647 (HH) 02/19/2016 0904   HDL 28 (L) 02/19/2016 0904   CHOLHDL 8.5 (H) 02/19/2016 0904   CHOLHDL 6.8 07/31/2014 0607   VLDL UNABLE TO CALCULATE IF TRIGLYCERIDE OVER 400 mg/dL 07/31/2014 0607   LDLCALC Comment 02/19/2016 0904   HgbA1c:  Lab Results  Component Value Date   HGBA1C 10.0 (H) 05/21/2016   Urine Drug Screen: No results found for: LABOPIA, COCAINSCRNUR, LABBENZ, AMPHETMU, THCU, LABBARB    IMAGING   MRI Brain Wo Contrast 05/22/2016 1. No  acute intracranial abnormality. 2. Chronic left basal ganglia lacunar infarct.    [name] Head Wo Contrast 05/23/2016 1. No visible flow in the left V4 segment. See MRA neck that is reported separately.  2. High-grade atheromatous narrowings at the left ICA anterior genu, bilateral M2 branches, and bilateral P2 branches.    [name] Neck Wo Contrast 05/23/2016 1. Thready flow within the left vertebral artery which is new compared to 2015 imaging. Suspect a high-grade proximal left subclavian stenosis favoring steal phenomenon. Dissection is the main differential consideration.  CTA is recommended to further evaluate.  2. Cervical carotid atherosclerosis with ~ 40% proximal left ICA stenosis.    CTA Neck 05/24/2016 1. Left V3 segment occlusion. Distal left V4 reconstitution by the basilar with patent left PICA. 2. Diffuse narrowing and luminal irregularity of the left vertebral artery.   Correlate for neck pain as dissection or atherosclerosis and underfilling could give this appearance. 3. Mild atherosclerotic narrowing of the left subclavian ostium. No subclavian flow limiting stenosis. 4. Cervical carotid atherosclerosis with 30 to 40 % ICA narrowing on the left.   PHYSICAL EXAM Physical exam: Exam: Gen: NAD Eyes: anicteric sclerae, moist conjunctivae                    CV: no MRG, no carotid bruits, no peripheral edema Mental Status: Alert, follows commands, good historian  Neuro: Detailed Neurologic Exam  Speech:    No aphasia, no dysarthria  Cranial Nerves:    The pupils are equal, round, and reactive to light.. Attempted, Fundi not visualized.  EOMI. No gaze preference. Visual fields full. Face symmetric, Tongue midline. Hearing intact to voice. Shoulder shrug intact  Motor Observation:    no involuntary movements noted. Tone appears normal.     Strength:    5/5     Sensation:  Intact to LT  Plantars downgoing.    ASSESSMENT/PLAN [name] [name] is a 66 y.o.  male with history of hypertension, hyperlipidemia, ongoing tobacco use, remote stroke, diabetes mellitus, coronary artery disease, history of medical noncompliance, and diabetes mellitus presenting with recurrent syncope. He did not receive IV t-PA due to no acute stroke.  Cerebrovascular atherosclerotic disease with left vertebral atherosclerosis vs dissection  Resultant  No deficits  MRI - No acute intracranial abnormality. Chronic left basal ganglia lacunar infarct.  MRA - Thready flow within the left vertebral artery. No visible flow in the left V4 segment. High grade L ICA, bilateral M2 branches and bilateral P2 branches.  CTA Neck - Left V3 segment occlusion. Diffuse narrowing and luminal irregularity of the left vertebral artery. CTA showed narrowing and luminal irregularity of the left vertebral artery, dissection vs atherosclerosis. Recommend aggressive management of his uncontrolled vascular risk factors (cholesterol, diabetes, HTN) and switching from ASA to plavix with f/u CTA in 6-8 weeks.  Carotid Doppler - Performed at [facility] - no significant internal carotid artery stenosis.  2D Echo - EF 55-60%.  No cardiac source of emboli identified.  LDL - 02/19/2016 - unable to calculate LDL secondary to elevated triglycerides - 647  HgbA1c - 10  VTE prophylaxis - Lovenox  Diet Heart Room service appropriate? Yes; Fluid consistency: Thin  aspirin 81 mg daily prior to admission, now on aspirin 81 mg daily. Recommend changing to Plavix and patient needs repeat CTA in 6 weeks and aggressive management of multiple uncontrolled vascular risk factors.  Patient counseled to be compliant with his antithrombotic medications  Ongoing aggressive stroke risk factor management  Therapy recommendations: pending  Disposition: Pending  Hypertension  Blood pressure somewhat high; however, with diffuse cerebrovascular disease would avoid hypotension.  Long-term BP goal  normotensive  Hyperlipidemia  Home meds:  Lopid 600 mg BID - not resumed in hospital  LDL could not be calculated, goal < 70  Pravastatin -> itching. Crestor and Lipitor -> myalgias. Needs aggressive management of lipids.    Diabetes  HgbA1c 10, goal < 7.0  Uncontrolled  Other Stroke Risk Factors  Advanced age  Cigarette smoker - advised to stop smoking  Obesity, Body mass index is 36.21 kg/m., recommend weight loss, diet and exercise as appropriate   Hx stroke/TIA  Coronary artery disease   Other Active Problems  Reported allergy to Shellfish and Iodine. Discussed with radiology - Dr Shon Baton - and spoke by telephone to the patient's nurse who was in the room with the patient. The patient denies any previous reaction to contrast. He has undergone cardiac catheterization in the past without problem despite allergy to shellfish. The patient is not aware of an "iodine" allergy. Will proceed with CTA of neck as recommended based on MRA results.  Dehydration - BUN - 37 -> 27    Hospital day # 0   Personally examined patient and images, and have participated in and made any corrections needed to history, physical, neuro exam,assessment and plan as stated above.  I have personally obtained the history, evaluated lab date, reviewed imaging studies and agree with radiology interpretations. Patient without stroke but left vert irregularity CTA showed narrowing and luminal irregularity of the left vertebral artery, dissection vs atherosclerosis. Recommend aggressive risk factor management (cholesterol, diabetes, HTN) and switching from ASA to plavix with f/u CTA in 6 weeks to follow. He has uncontrolled vascular risk factors, needs aggressive management and follow up with primary care. Stroke team will sign off.     Sarina Ill, MD Stroke Neurology  To contact Stroke Continuity provider, please refer to [email or web address] After hours, contact General Neurology

## 2016-05-24 NOTE — Progress Notes (Signed)
PROGRESS NOTE    [name] [name]  [id number] DOB: [birth year] DOA: 05/21/2016 PCP: Claretta Fraise, MD     Brief Narrative:  [name] [name] a 66 y.o.malewith medical history significant of diabetes mellitus, hypertension, CAD, s/p PCI, arthritis, GERD, hiatal hernia, presents to ED with multiple episodes of presyncopal episodes over the last 8 to 9 months. A few days ago, patient had a syncopal episode. He states that prior to this episode he had some visual field blurriness as well as other change to yellow. The next morning, patient continued to feel ill and was evaluated at [facility]. Upon evaluation, MRA brain showed No visible flow in the left V4 segment. High-grade atheromatous narrowings at the left ICA anterior genu, bilateral M2 branches, and bilateral P2 branches. MRA of the neck showed Thready flow within the left vertebral artery which is new compared to 2015 imaging. Suspect a high-grade proximal left subclavian stenosis favoring steal phenomenon. Patient was transferred to [facility] for further evaluation, possible stress test, loop recorder placement.   Assessment & Plan:   Active Problems:   Hyperlipidemia associated with type 2 diabetes mellitus ([employer])   Essential hypertension, benign   CAD S/P percutaneous coronary angioplasty   Headache   GERD (gastroesophageal reflux disease)   Diabetic peripheral neuropathy associated with type 2 diabetes mellitus (HCC)   Depression   Diabetic neuropathy (HCC)   Syncope   Acute renal failure (HCC)  Syncope -EEG normal  -Echo: LV is increased in a pattern of mild LVH. LV systolic function is [id number]. Wall motion is normal, no regional wall abnormalities. Grade 2 diastolic dysfunction -MRI/MRA head: high-grade atheromatous narrowing at left ICA anterior genu, bilateral M2 branches, and bilateral P2 branches. Thready flow left vertebral artery, suspected high-grade proximal left subclavian stenosis. CTA pending  -Cardiology and  neurology following -Plan for loop recorder in AM  AKI -Prerenal due to dehydration -Resolved with IVF -Renal US unremarkable  Incident finding of diffuse hepatic steatosis on [place] -Need outpatient follow-up  CAD s/p PCI -No complaints of chest pain  -No need for stress test  -Continue aspirin  HTN -Goal SBP 140 due to significant vertebral artery stenosis  -Continue norvasc, irbesartan, imdur   DM type 2 -Novolog 70/30, +SSI   Depression/anxiety -Continue zoloft, xanax   GERD -Continue PPI   DVT prophylaxis: lovenox Code Status: DNR Family Communication: no family at bedside Disposition Plan: pending procedure tomorrow, suspect discharge back home    Consultants:   Neurology  Cardiology  Procedures:   None  Antimicrobials:   None    Subjective: Patient has no complaints this morning. He denies any further episodes of presyncopal, syncopal episodes. Denies any chest pain, shortness of breath. Besides some abdominal bloating after pizza, he has no new complaints.  Objective: Vitals:   05/23/16 1511 05/23/16 2050 05/23/16 2328 05/24/16 0529  BP: (!) 194/78 130/68 (!) 159/77 (!) 157/87  Pulse: 80 72 72 67  Resp: 18 18 13 18   Temp: 97.6 F (36.4 C) 97.9 F (36.6 C) 97.6 F (36.4 C) 97.8 F (36.6 C)  TempSrc: Oral Other (Comment) Oral Oral  SpO2: 97% 97% 97% 98%  Weight:   127.9 kg (282 lb) 127.9 kg (282 lb)  Height:   6\' 2"  (1.88 m)    No intake or output data in the 24 hours ending 05/24/16 1102 Filed Weights   05/21/16 1056 05/23/16 2328 05/24/16 0529  Weight: 128.4 kg (283 lb) 127.9 kg (282 lb) 127.9 kg (282 lb)  Examination:  General exam: Appears calm and comfortable  Respiratory system: Clear to auscultation. Respiratory effort normal. Cardiovascular system: S1 & S2 heard, RRR. No JVD, murmurs, rubs, gallops or clicks. No pedal edema. Gastrointestinal system: Abdomen is nondistended, soft and nontender. No organomegaly or masses felt.  Normal bowel sounds heard. Central nervous system: Alert and oriented. No focal neurological deficits. Extremities: Symmetric 5 x 5 power. Skin: No rashes, lesions or ulcers Psychiatry: Judgement and insight appear normal. Mood & affect appropriate.   Data Reviewed: I have personally reviewed following labs and imaging studies  CBC:  Recent Labs Lab 05/21/16 1114  WBC 8.7  NEUTROABS 5.5  HGB 15.8  HCT 47.2  MCV 89.7  PLT 99991111   Basic Metabolic Panel:  Recent Labs Lab 05/21/16 1114 05/22/16 0315 05/23/16 0654  NA 137 135 131*  K 4.6 4.2 4.6  CL 97* 103 101  CO2 28 24 23   GLUCOSE 195* 221* 282*  BUN 42* 37* 27*  CREATININE 2.31* 1.85* 1.19  CALCIUM 9.5 8.9 9.7   GFR: Estimated Creatinine Clearance: 88 mL/min (by C-G formula based on SCr of 1.19 mg/dL). Liver Function Tests:  Recent Labs Lab 05/21/16 1114  AST 26  ALT 31  ALKPHOS 61  BILITOT 0.8  PROT 7.5  ALBUMIN 4.1    Recent Labs Lab 05/21/16 1114  LIPASE 41   No results for input(s): AMMONIA in the last 168 hours. Coagulation Profile: No results for input(s): INR, PROTIME in the last 168 hours. Cardiac Enzymes:  Recent Labs Lab 05/21/16 1114 05/21/16 1556 05/21/16 2145 05/22/16 0315 05/23/16 0732  TROPONINI 0.05* 0.05* 0.04* 0.04* 0.04*   BNP (last 3 results) No results for input(s): PROBNP in the last 8760 hours. HbA1C:  Recent Labs  05/21/16 1114  HGBA1C 10.0*   CBG:  Recent Labs Lab 05/23/16 1635 05/23/16 2054 05/23/16 2319 05/24/16 0739 05/24/16 1033  GLUCAP 266* 184* 194* 208* 203*   Lipid Profile: No results for input(s): CHOL, HDL, LDLCALC, TRIG, CHOLHDL, LDLDIRECT in the last 72 hours. Thyroid Function Tests:  Recent Labs  05/21/16 1114  TSH 1.575   Anemia Panel: No results for input(s): VITAMINB12, FOLATE, FERRITIN, TIBC, IRON, RETICCTPCT in the last 72 hours. Sepsis Labs: No results for input(s): PROCALCITON, LATICACIDVEN in the last 168 hours.  No  results found for this or any previous visit (from the past 240 hour(s)).     Radiology Studies: Mr Virgel Paling [id number] Contrast  Result Date: 05/23/2016 CLINICAL DATA:  Syncope. EXAM: MRA HEAD WITHOUT CONTRAST TECHNIQUE: Angiographic images of the Circle of Willis were obtained using MRA technique without intravenous contrast. COMPARISON:  None. FINDINGS: No flow seen within the left vertebral artery, which is present based on CT imaging and cervical spine MRI 10/29/2013. MRA neck reported separately. Patent right PICA and left AICA. Right posterior communicating artery. There is bilateral atherosclerotic irregularity of the bilateral PCA is with bilateral high-grade P2 segment stenoses, more proximal on the left. Symmetric carotid arteries. Moderate to advanced narrowing of the left ICA anterior genu. Mild narrowing of the right ICA anterior genu. No major branch occlusion. Bilateral proximal M2 branch moderate to advanced narrowings. IMPRESSION: 1. No visible flow in the left V4 segment. See MRA neck that is reported separately. 2. High-grade atheromatous narrowings at the left ICA anterior genu, bilateral M2 branches, and bilateral P2 branches. Electronically Signed   By: Monte Fantasia M.D.   On: 05/23/2016 12:48   [name] Neck Wo Contrast  Result Date:  05/23/2016 CLINICAL DATA:  Syncopal episode. EXAM: MRA NECK WITHOUT CONTRAST TECHNIQUE: Angiographic images of the neck were obtained using MRA technique without intravenous contrast. COMPARISON:  None. FINDINGS: Time-of-flight imaging was performed with limited signal in the proximal great vessels. Still, there is suggestion of a high-grade proximal left subclavian stenosis. Intermittent, thready flow seen in the left vertebral artery, which is not seen by the level of the C1 ring. This is a new finding when correlated with cervical spine MRI 10/29/2013 when flow voids were nearly symmetric and normal. Additionally, on ultrasound from 2 days ago, there may  have been early reversal of systolic flow on left vertebral insonnation. The right vertebral artery is smooth and widely patent where there is good signal. Bilateral cervical carotid bifurcation atherosclerosis with proximal left ICA narrowing measuring 40%. IMPRESSION: 1. Thready flow within the left vertebral artery which is new compared to 2015 imaging. Suspect a high-grade proximal left subclavian stenosis favoring steal phenomenon. Dissection is the main differential consideration. CTA is recommended to further evaluate. 2. Cervical carotid atherosclerosis with ~ 40% proximal left ICA stenosis. Electronically Signed   By: Monte Fantasia M.D.   On: 05/23/2016 13:00      Scheduled Meds: . iopamidol      . amLODipine  10 mg Oral Daily  . aspirin EC  81 mg Oral Daily  . enoxaparin (LOVENOX) injection  60 mg Subcutaneous Q24H  . gabapentin  300 mg Oral BID  . insulin aspart  0-5 Units Subcutaneous QHS  . insulin aspart  0-9 Units Subcutaneous TID WC  . insulin aspart protamine- aspart  70 Units Subcutaneous BID WC  . irbesartan  75 mg Oral Daily  . isosorbide mononitrate  30 mg Oral Daily  . pantoprazole  40 mg Oral Daily  . sertraline  50 mg Oral Daily   Continuous Infusions:   LOS: 0 days    Time spent: 40 minutes   Dessa Phi, DO [facility] [email or web address] Password [email or web address] 05/24/2016, 11:02 AM

## 2016-05-24 NOTE — Consult Note (Signed)
NEURO HOSPITALIST CONSULT NOTE   Requestig physician: Dr. Maylene Roes  Reason for Consult: Syncopal like spells with preservation of consciousness  History obtained from:  Patient and Chart     HPI:                                                                                                                                          [name] [name] is an 66 y.o. male who initially presented to [facility] for evaluation of multiple presyncopal episodes over the last 8 to 9 months. He has a PMHx of DM, HTN, CAD s/p PCI, arthritis, GERD, hiatal hernia.   Per initial H and P at [facility]: "As per the patient, the episode starts with his visual field all yellow, followed by blurry vision and he feels like passing out and that's when he sits on the floor. He reports it happens when he is laying down. It will be followed by sharp pain behind his right eye and right sided headache. He also reports some chest pressure and associated with nausea , palpitations. He reports he never completely loses consciousness. He was never worked up in the last few months for these episodes. On arrival to ED, he underwent ct of the head and neck , which showed some pseudo arthrosis and foraminal stenosis, but otherwise unremarkable."   He was admitted to telemetry for further evaluation. At OSH, CT maxillofacial was unremarkable. Carotid ultrasound did not show any significant ICA stenosis. EKG showed sinus bradycardia. He had one episode of presyncope on 1/5 with some blurry vision and headache.   MRI brain without contrast performed at OSH showed old stroke. MRA of the head revealed no visible flow in the left V4 segment; there were high-grade atheromatous narrowings at the left ICA anterior genu, bilateral M2 branches, and bilateral P2 branches. MRA of the neck showed thready flow within the left vertebral artery which was new compared to 2015 imaging; suspected a high-grade proximal left subclavian  stenosis favoring steal phenomenon; dissection also was a consideration. Findings consistent with cervical carotid atherosclerosis with ~ 40% proximal left ICA stenosis were also noted. CTA was recommended to further evaluate.  EEG during the awake and drowsy states was normal.   Echo showed increased wall thickness of the LV in a pattern of mild LVH. LV systolic function was [id number]. Wall motion was normal, with no regional wall abnormalities. There was grade 2 diastolic dysfunction. High ventricular filling pressure was also noted.  He had minimally elevated troponins probably from renal dysfunction. Overnight telemetry on his last day at OSH was unremarkable except for PVC's.  Cardiology was consulted while at [facility]. The plan was to transfer the patient to [facility] for possible nuclear stress test and loop recorder placement.  Neurology was to be consulted for the abnormal MRA of the neck.    Past Medical History:  Diagnosis Date  . AODM   . Arthritis   . CAD (coronary artery disease)    2010  LAD 50%, RCA 100%.  DES to RCA.  EF 55%  . Cellulitis and abscess rt groin   . CHEST PAIN-UNSPECIFIED   . Diabetes mellitus   . Disorders of iron metabolism   . DISORDERS OF IRON METABOLISM   . GERD (gastroesophageal reflux disease)   . Hyperlipidemia   . Hypertension   . Low serum testosterone level   . Medically noncompliant   . MURMUR     Past Surgical History:  Procedure Laterality Date  . BACK SURGERY  2015   ACDF by Dr. [name]  . COLONOSCOPY N/A 10/01/2014   Procedure: COLONOSCOPY;  Surgeon: Daneil Dolin, MD;  Location: [facility];  Service: Endoscopy;  Laterality: N/A;  815  . CORONARY STENT PLACEMENT  2000   By Dr. Olevia Perches  . ESOPHAGOGASTRODUODENOSCOPY     esophagus stretched remotely at [facility]  . ESOPHAGOGASTRODUODENOSCOPY N/A 10/01/2014   Procedure: ESOPHAGOGASTRODUODENOSCOPY (EGD);  Surgeon: Daneil Dolin, MD;  Location: [facility];  Service: Endoscopy;  Laterality:  N/A;  . HERNIA REPAIR  [id number]   umbilical  . LESION REMOVAL     Lip and hand   . NECK SURGERY      Family History  Problem Relation Age of Onset  . Diabetes Father   . Valvular heart disease Father   . Arthritis Father   . Heart disease Father   . Alzheimer's disease Mother   . Hyperlipidemia Mother   . Hypertension Mother   . Cancer Mother     lung  . Arthritis Mother   . Arthritis Sister     rheumatoid  . Diabetes Sister   . Hypertension Sister   . Hyperlipidemia Sister   . Dementia Maternal Aunt   . Dementia Maternal Uncle   . Heart disease Maternal Uncle   . Cancer Paternal Uncle     stomach  . Colon cancer Neg Hx   . Liver disease Neg Hx    Social History:  reports that he has been smoking Cigarettes.  He has a 12.75 pack-year smoking history. He has never used smokeless tobacco. He reports that he does not drink alcohol or use drugs.  Allergies  Allergen Reactions  . Shellfish Allergy Anaphylaxis    Tongue swelling, hives  . Ace Inhibitors Other (See Comments) and Cough    CKD, renal failure   . Invokana [Canagliflozin] Other (See Comments)    Syncope / dehydration  . Metformin And Related Itching  . Pravastatin Sodium     myalgias  . Fenofibrate     Body aches  . Horse-Derived Products     REACTION: Rash  . Iodine     ????  . Lexapro [Escitalopram Oxalate]     Buzzing in ears,headache, felt like a zombie  . Lisinopril Cough  . Crestor [Rosuvastatin] Other (See Comments)    Myalgias   . Lipitor [Atorvastatin] Other (See Comments)    myalgias    MEDICATIONS:  Scheduled: . amLODipine  10 mg Oral Daily  . aspirin EC  81 mg Oral Daily  . enoxaparin (LOVENOX) injection  60 mg Subcutaneous Q24H  . gabapentin  300 mg Oral BID  . insulin aspart  0-5 Units Subcutaneous QHS  . insulin aspart  0-9 Units Subcutaneous TID WC  . insulin aspart  protamine- aspart  70 Units Subcutaneous BID WC  . irbesartan  75 mg Oral Daily  . isosorbide mononitrate  30 mg Oral Daily  . pantoprazole  40 mg Oral Daily  . sertraline  50 mg Oral Daily     ROS:                                                                                                                                       History obtained from patient. As per HPI.   Blood pressure (!) 157/87, pulse 67, temperature 97.8 F (36.6 C), temperature source Oral, resp. rate 18, height 6\' 2"  (1.88 m), weight 127.9 kg (282 lb), SpO2 98 %.  General Examination:                                                                                                      HEENT-  Normocephalic/atraumatic Lungs- No gross wheezing. Respirations unlabored.  Extremities- Warm and well-perfused.   Neurological Examination Mental Status: Alert, oriented, thought content appropriate.  Speech fluent without evidence of aphasia.  Able to follow all commands without difficulty. Cranial Nerves: II: Visual fields intact to confrontation, PERRL III,IV, VI: ptosis not present, extra-ocular motions intact bilaterally V,VII: smile symmetric, facial temperature sensation normal bilaterally VIII: hearing intact to conversation IX,X: palate rises symmetrically XI: symmetric XII: midline tongue extension Motor: Right : Upper extremity   5/5    Left:     Upper extremity   5/5  Lower extremity   5/5     Lower extremity   5/5 Sensory: Temperature and light touch intact x 4, except for distally decreased temperature sensation in stocking distribution bilaterally.  Deep Tendon Reflexes: Absent patellar and achilles reflexes. 1+ biceps and brachioradialis bilaterally.  Cerebellar: No ataxia with FNF bilaterally.  Gait: Deferred.   Lab Results: Basic Metabolic Panel:  Recent Labs Lab 05/21/16 1114 05/22/16 0315 05/23/16 0654  NA 137 135 131*  K 4.6 4.2 4.6  CL 97* 103 101  CO2 28 24 23   GLUCOSE 195* 221*  282*  BUN 42* 37* 27*  CREATININE 2.31* 1.85* 1.19  CALCIUM 9.5 8.9 9.7  Liver Function Tests:  Recent Labs Lab 05/21/16 1114  AST 26  ALT 31  ALKPHOS 61  BILITOT 0.8  PROT 7.5  ALBUMIN 4.1    Recent Labs Lab 05/21/16 1114  LIPASE 41   No results for input(s): AMMONIA in the last 168 hours.  CBC:  Recent Labs Lab 05/21/16 1114  WBC 8.7  NEUTROABS 5.5  HGB 15.8  HCT 47.2  MCV 89.7  PLT 238    Cardiac Enzymes:  Recent Labs Lab 05/21/16 1114 05/21/16 1556 05/21/16 2145 05/22/16 0315 05/23/16 0732  TROPONINI 0.05* 0.05* 0.04* 0.04* 0.04*    Lipid Panel: No results for input(s): CHOL, TRIG, HDL, CHOLHDL, VLDL, LDLCALC in the last 168 hours.  CBG:  Recent Labs Lab 05/23/16 0745 05/23/16 1212 05/23/16 1635 05/23/16 2054 05/23/16 2319  GLUCAP 284* 168* 266* 184* 194*    Microbiology: Results for orders placed or performed in visit on 02/11/16  Urine culture     Status: None   Collection Time: 02/11/16 11:49 AM  Result Value Ref Range Status   Urine Culture, Routine Final report  Final   Urine Culture result 1 Comment  Final    Comment: Culture shows less than 10,000 colony forming units of bacteria per milliliter of urine. This colony count is not generally considered to be clinically significant.   Microscopic Examination     Status: None   Collection Time: 02/11/16 11:49 AM  Result Value Ref Range Status   WBC, UA 0-5 0 - 5 /hpf Final   RBC, UA 0-2 0 - 2 /hpf Final   Epithelial Cells (non renal) None seen 0 - 10 /hpf Final   Mucus, UA Present Not Estab. Final   Bacteria, UA None seen None seen/Few Final    Coagulation Studies: No results for input(s): LABPROT, INR in the last 72 hours.  Imaging: Mr Virgel Paling [id number] Contrast  Result Date: 05/23/2016 CLINICAL DATA:  Syncope. EXAM: MRA HEAD WITHOUT CONTRAST TECHNIQUE: Angiographic images of the Circle of Willis were obtained using MRA technique without intravenous contrast. COMPARISON:   None. FINDINGS: No flow seen within the left vertebral artery, which is present based on CT imaging and cervical spine MRI 10/29/2013. MRA neck reported separately. Patent right PICA and left AICA. Right posterior communicating artery. There is bilateral atherosclerotic irregularity of the bilateral PCA is with bilateral high-grade P2 segment stenoses, more proximal on the left. Symmetric carotid arteries. Moderate to advanced narrowing of the left ICA anterior genu. Mild narrowing of the right ICA anterior genu. No major branch occlusion. Bilateral proximal M2 branch moderate to advanced narrowings. IMPRESSION: 1. No visible flow in the left V4 segment. See MRA neck that is reported separately. 2. High-grade atheromatous narrowings at the left ICA anterior genu, bilateral M2 branches, and bilateral P2 branches. Electronically Signed   By: Monte Fantasia M.D.   On: 05/23/2016 12:48   [name] Neck Wo Contrast  Result Date: 05/23/2016 CLINICAL DATA:  Syncopal episode. EXAM: MRA NECK WITHOUT CONTRAST TECHNIQUE: Angiographic images of the neck were obtained using MRA technique without intravenous contrast. COMPARISON:  None. FINDINGS: Time-of-flight imaging was performed with limited signal in the proximal great vessels. Still, there is suggestion of a high-grade proximal left subclavian stenosis. Intermittent, thready flow seen in the left vertebral artery, which is not seen by the level of the C1 ring. This is a new finding when correlated with cervical spine MRI 10/29/2013 when flow voids were nearly symmetric and normal. Additionally, on ultrasound from  2 days ago, there may have been early reversal of systolic flow on left vertebral insonnation. The right vertebral artery is smooth and widely patent where there is good signal. Bilateral cervical carotid bifurcation atherosclerosis with proximal left ICA narrowing measuring 40%. IMPRESSION: 1. Thready flow within the left vertebral artery which is new compared to  2015 imaging. Suspect a high-grade proximal left subclavian stenosis favoring steal phenomenon. Dissection is the main differential consideration. CTA is recommended to further evaluate. 2. Cervical carotid atherosclerosis with ~ 40% proximal left ICA stenosis. Electronically Signed   By: Monte Fantasia M.D.   On: 05/23/2016 13:00   Mr Brain Wo Contrast  Result Date: 05/22/2016 CLINICAL DATA:  Multiple pre syncopal episodes. Generalized weakness, body aches, right-sided facial pain, and headache for 3 days. EXAM: MRI HEAD WITHOUT CONTRAST TECHNIQUE: Multiplanar, multiecho pulse sequences of the brain and surrounding structures were obtained without intravenous contrast. COMPARISON:  Head CT 05/21/2016 FINDINGS: Brain: There is no evidence of acute infarct, intracranial hemorrhage, mass, midline shift, or extra-axial fluid collection. The ventricles and sulci are normal. A chronic lacunar infarct is present in the left putamen. The brain is otherwise normal in signal for age. Vascular: Abnormal appearance of the distal left vertebral artery may reflect atherosclerosis and decreased flow. Other major intracranial vascular flow voids are preserved. Skull and upper cervical spine: No focal marrow lesion is identified. Sinuses/Orbits: Unremarkable orbits.  No significant sinus disease. Other: None. IMPRESSION: 1. No acute intracranial abnormality. 2. Chronic left basal ganglia lacunar infarct. Electronically Signed   By: Logan Bores M.D.   On: 05/22/2016 07:40   US Renal  Result Date: 05/22/2016 CLINICAL DATA:  Inpatient with acute renal failure. EXAM: RENAL / URINARY TRACT ULTRASOUND COMPLETE COMPARISON:  08/17/2014 CT abdomen/pelvis FINDINGS: Right Kidney: Length: 12.5 cm. Mildly echogenic right kidney. No hydronephrosis. No right renal mass. Incidental diffuse hepatic steatosis in the visualized right liver lobe. Left Kidney: Length: 13.4 cm. Mildly echogenic left kidney. No hydronephrosis. No left renal mass.  Bladder: Appears normal for degree of bladder distention. IMPRESSION: 1. No hydronephrosis. 2. Mildly echogenic normal size kidneys, indicating nonspecific renal parenchymal disease of uncertain chronicity. 3. Normal bladder. 4. Incidental diffuse hepatic steatosis. Electronically Signed   By: Ilona Sorrel M.D.   On: 05/22/2016 07:55    Assessment: 1. Recurrent spells of unresponsiveness with retained consciousness memory of events. EEG negative. Initial studies show multifocal intracranial and extracranial stenoses, including findings suggestive of vertebrobasilar insufficiency. Dissection not excluded. Also on DDx is subclavian steal syndrome.  2. Stroke risk factors and associated conditions: DM, HLD, HTN and CAD.   Recommendations: 1. Continue ASA.  2. Has allergy to statins.  3. BP management. SBP goal of 140 given significant vertebral artery stenosis.  4. Telemetry.  5. CTA of head and neck.  6. CTA of aortic arch and proximal great vessels, to assess for possible significant stenosis of the proximal subclavian artery on the left or right.     Electronically signed: Dr. Kerney Elbe  05/24/2016, 5:47 AM

## 2016-05-24 NOTE — Evaluation (Signed)
Physical Therapy Evaluation and Discharge Patient Details Name: [name] [name] MRN: [id number] DOB: 1950-06-20 Today's Date: 05/24/2016   History of Present Illness  [name] [name] is a 66 y.o. male with medical history significant of diabetes mellitus, hypertension, CAD, s/p PCI, arthritis, GERD, hiatal hernia, presents to ED with multiple episodes of presyncopal episodes over the last 8 to 9 months. A few days ago, patient had a syncopal episode. He states that prior to this episode he had some visual field blurriness as well as other change to yellow.   Clinical Impression  .Pt admitted with above, functioning at baseline without LOB or episodes of syncope. Pt with good home set up and support. Pt with no further acute PT needs at this time. PT SIGNING off. Please re-consult if needed in future.    Follow Up Recommendations No PT follow up    Equipment Recommendations       Recommendations for Other Services       Precautions / Restrictions Precautions Precautions: None Restrictions Weight Bearing Restrictions: No      Mobility  Bed Mobility               General bed mobility comments: pt up in chair  Transfers Overall transfer level: Independent Equipment used: None             General transfer comment: no difficulty, steady  Ambulation/Gait Ambulation/Gait assistance: Independent Ambulation Distance (Feet): 300 Feet Assistive device: None Gait Pattern/deviations: WFL(Within Functional Limits) Gait velocity: wfl Gait velocity interpretation: at or above normal speed for age/gender General Gait Details: no episodes of syncopy or dizziness  Stairs Stairs: Yes Stairs assistance: Modified independent (Device/Increase time) Stair Management: One rail Right Number of Stairs: 3 (to mimic home set up) General stair comments: step to going down   Wheelchair Mobility    Modified Rankin (Stroke Patients Only)       Balance Overall balance assessment: No  apparent balance deficits (not formally assessed)                                           Pertinent Vitals/Pain Pain Assessment: No/denies pain    Home Living Family/patient expects to be discharged to:: Private residence Living Arrangements: Spouse/significant other Available Help at Discharge: Family;Available 24 hours/day Type of Home: House Home Access: Stairs to enter Entrance Stairs-Rails: Can reach both Entrance Stairs-Number of Steps: 3 Home Layout: One level Home Equipment: None      Prior Function Level of Independence: Independent               Hand Dominance   Dominant Hand: Right    Extremity/Trunk Assessment   Upper Extremity Assessment Upper Extremity Assessment: Overall WFL for tasks assessed    Lower Extremity Assessment Lower Extremity Assessment: Overall WFL for tasks assessed    Cervical / Trunk Assessment Cervical / Trunk Assessment: Normal  Communication   Communication: No difficulties  Cognition Arousal/Alertness: Awake/alert Behavior During Therapy: WFL for tasks assessed/performed Overall Cognitive Status: Within Functional Limits for tasks assessed                      General Comments      Exercises     Assessment/Plan    PT Assessment Patent does not need any further PT services  PT Problem List  PT Treatment Interventions      PT Goals (Current goals can be found in the Care Plan section)  Acute Rehab PT Goals Patient Stated Goal: home asap PT Goal Formulation: All assessment and education complete, DC therapy    Frequency     Barriers to discharge        Co-evaluation               End of Session Equipment Utilized During Treatment: Gait belt Activity Tolerance: Patient tolerated treatment well Patient left: in chair;with family/visitor present Nurse Communication: Mobility status    Functional Assessment Tool Used: clinical judgement Functional  Limitation: Mobility: Walking and moving around Mobility: Walking and Moving Around Current Status [phone]): 0 percent impaired, limited or restricted Mobility: Walking and Moving Around Goal Status [phone]): 0 percent impaired, limited or restricted Mobility: Walking and Moving Around Discharge Status [phone]): 0 percent impaired, limited or restricted    Time: [id number] PT Time Calculation (min) (ACUTE ONLY): 21 min   Charges:   PT Evaluation $PT Eval Low Complexity: 1 Procedure     PT G Codes:   PT G-Codes **NOT FOR INPATIENT CLASS** Functional Assessment Tool Used: clinical judgement Functional Limitation: Mobility: Walking and moving around Mobility: Walking and Moving Around Current Status [id number]): 0 percent impaired, limited or restricted Mobility: Walking and Moving Around Goal Status [id number]): 0 percent impaired, limited or restricted Mobility: Walking and Moving Around Discharge Status [id number]): 0 percent impaired, limited or restricted    Ryen Rhames M Goldie Tregoning 05/24/2016, 4:36 PM  Kittie Plater, PT, DPT Pager #: [phone] Office #: [phone]

## 2016-05-25 ENCOUNTER — Encounter (HOSPITAL_COMMUNITY)
Admission: EM | Disposition: A | Discharge status: Home / Self Care | Payer: Self-pay | Source: Home / Self Care | Attending: Internal Medicine

## 2016-05-25 ENCOUNTER — Encounter (HOSPITAL_COMMUNITY): Payer: Self-pay | Admitting: Internal Medicine

## 2016-05-25 ENCOUNTER — Ambulatory Visit: Payer: Self-pay | Admitting: Family

## 2016-05-25 DIAGNOSIS — K219 Gastro-esophageal reflux disease without esophagitis: Secondary | ICD-10-CM | POA: Diagnosis not present

## 2016-05-25 DIAGNOSIS — E1142 Type 2 diabetes mellitus with diabetic polyneuropathy: Secondary | ICD-10-CM | POA: Diagnosis not present

## 2016-05-25 DIAGNOSIS — N179 Acute kidney failure, unspecified: Secondary | ICD-10-CM | POA: Diagnosis not present

## 2016-05-25 DIAGNOSIS — E785 Hyperlipidemia, unspecified: Secondary | ICD-10-CM | POA: Diagnosis not present

## 2016-05-25 DIAGNOSIS — Z794 Long term (current) use of insulin: Secondary | ICD-10-CM | POA: Diagnosis not present

## 2016-05-25 DIAGNOSIS — Z7982 Long term (current) use of aspirin: Secondary | ICD-10-CM | POA: Diagnosis not present

## 2016-05-25 DIAGNOSIS — E119 Type 2 diabetes mellitus without complications: Secondary | ICD-10-CM

## 2016-05-25 DIAGNOSIS — Z91041 Radiographic dye allergy status: Secondary | ICD-10-CM | POA: Diagnosis not present

## 2016-05-25 DIAGNOSIS — I2510 Atherosclerotic heart disease of native coronary artery without angina pectoris: Secondary | ICD-10-CM | POA: Diagnosis not present

## 2016-05-25 DIAGNOSIS — R55 Syncope and collapse: Secondary | ICD-10-CM | POA: Diagnosis not present

## 2016-05-25 DIAGNOSIS — I6502 Occlusion and stenosis of left vertebral artery: Secondary | ICD-10-CM | POA: Diagnosis not present

## 2016-05-25 DIAGNOSIS — Z888 Allergy status to other drugs, medicaments and biological substances status: Secondary | ICD-10-CM | POA: Diagnosis not present

## 2016-05-25 DIAGNOSIS — Z91013 Allergy to seafood: Secondary | ICD-10-CM | POA: Diagnosis not present

## 2016-05-25 DIAGNOSIS — K760 Fatty (change of) liver, not elsewhere classified: Secondary | ICD-10-CM | POA: Diagnosis not present

## 2016-05-25 DIAGNOSIS — F329 Major depressive disorder, single episode, unspecified: Secondary | ICD-10-CM | POA: Diagnosis not present

## 2016-05-25 DIAGNOSIS — Z66 Do not resuscitate: Secondary | ICD-10-CM | POA: Diagnosis not present

## 2016-05-25 DIAGNOSIS — I119 Hypertensive heart disease without heart failure: Secondary | ICD-10-CM | POA: Diagnosis not present

## 2016-05-25 DIAGNOSIS — E669 Obesity, unspecified: Secondary | ICD-10-CM | POA: Diagnosis not present

## 2016-05-25 DIAGNOSIS — F419 Anxiety disorder, unspecified: Secondary | ICD-10-CM | POA: Diagnosis not present

## 2016-05-25 HISTORY — PX: EP IMPLANTABLE DEVICE: SHX172B

## 2016-05-25 LAB — BASIC METABOLIC PANEL
Anion gap: 12 (ref 5–15)
BUN: 30 mg/dL — ABNORMAL HIGH (ref 6–20)
CO2: 23 mmol/L (ref 22–32)
Calcium: 10.1 mg/dL (ref 8.9–10.3)
Chloride: 99 mmol/L — ABNORMAL LOW (ref 101–111)
Creatinine, Ser: 1.36 mg/dL — ABNORMAL HIGH (ref 0.61–1.24)
GFR calc Af Amer: >60 mL/min (ref >60)
GFR calc non Af Amer: 53 mL/min — ABNORMAL LOW (ref >60)
Glucose, Bld: 254 mg/dL — ABNORMAL HIGH (ref 65–99)
Potassium: 4.4 mmol/L (ref 3.5–5.1)
Sodium: 134 mmol/L — ABNORMAL LOW (ref 135–145)

## 2016-05-25 LAB — GLUCOSE, CAPILLARY
Glucose-Capillary: 253 mg/dL — ABNORMAL HIGH (ref 65–99)
Glucose-Capillary: 213 mg/dL — ABNORMAL HIGH (ref 65–99)

## 2016-05-25 SURGERY — LOOP RECORDER INSERTION
Anesthesia: LOCAL

## 2016-05-25 MED ORDER — ONDANSETRON HCL 4 MG/2ML IJ SOLN: 4.0000 mg | Freq: Four times a day (QID) | INTRAMUSCULAR | Status: DC | PRN

## 2016-05-25 MED ORDER — ACETAMINOPHEN 325 MG PO TABS: 325.0000 mg | ORAL_TABLET | ORAL | Status: DC | PRN

## 2016-05-25 MED ORDER — LIDOCAINE HCL (PF) 1 % IJ SOLN
INTRAMUSCULAR | Status: AC
  Filled 2016-05-25: qty 30

## 2016-05-25 MED ORDER — LIDOCAINE HCL (PF) 1 % IJ SOLN
INTRAMUSCULAR | Status: DC | PRN
  Administered 2016-05-25: 5 mL via SUBCUTANEOUS

## 2016-05-25 MED ORDER — INSULIN NPH ISOPHANE & REGULAR (70-30) 100 UNIT/ML ~~LOC~~ SUSP: 75.0000 [IU] | Freq: Two times a day (BID) | SUBCUTANEOUS | 0 refills | Status: DC

## 2016-05-25 MED ORDER — AMLODIPINE BESYLATE 10 MG PO TABS: 10.0000 mg | ORAL_TABLET | Freq: Every day | ORAL | 0 refills | Status: DC

## 2016-05-25 MED ORDER — CLOPIDOGREL BISULFATE 75 MG PO TABS: 75.0000 mg | ORAL_TABLET | Freq: Every day | ORAL | 0 refills | Status: DC

## 2016-05-25 SURGICAL SUPPLY — 2 items
LOOP REVEAL LINQSYS (Prosthesis & Implant Heart) ×1 IMPLANT
PACK LOOP INSERTION (CUSTOM PROCEDURE TRAY) ×2 IMPLANT

## 2016-05-25 NOTE — Discharge Summary (Signed)
Physician Discharge Summary  [name] [name] [id number] DOB: Sep 23, 1950 DOA: 05/21/2016  PCP: Claretta Fraise, MD  Admit date: 05/21/2016 Discharge date: 05/25/2016  Admitted From: Home Disposition:  Home  Recommendations for Outpatient Follow-up:  1. Follow up with PCP in 1 week 2. Follow up with Cardiology in 1 week 3. Follow up with Neurology in 6 weeks. Ambulator referral placed. Need repeat CTA neck in 6-8 weeks.  4. Please obtain BMP in 1 week  5. Need better glycemic control. Ha1c 10. Follow up with PCP.  6. Need better cholesterol control. Has intolerance to pravachol, lipitor, crestor. Follow up with PCP.  7. Continue blood pressure control. Currently 121/62 at time of discharge.  8. Stop asprin and start plavix  9. Incident finding of diffuse hepatic steatosis on ultrasound. Need outpatient follow-up.   Home Health: no  Equipment/Devices: none   Discharge Condition: Stable CODE STATUS: DNR  Diet recommendation: Carb modified, heart healthy  Brief/Interim Summary: [name] A Groganis a 66 y.o.malewith medical history significant of diabetes mellitus, hypertension, CAD, s/p PCI, arthritis, GERD, hiatal hernia, presents to ED with multiple episodes of presyncopal episodes over the last 8 to 9 months. A few days ago, patient had a syncopal episode. He states that prior to this episode he had some visual field blurriness as well as other change to yellow. The next morning, patient continued to feel ill and was evaluated at [facility]. Upon evaluation, MRA brain showed No visible flow in the left V4 segment. High-grade atheromatous narrowings at the left ICA anterior genu, bilateral M2 branches, and bilateral P2 branches. MRA of the neck showed Thready flow within the left vertebral artery which is new compared to 2015 imaging. Suspect a high-grade proximal left subclavian stenosis favoring steal phenomenon. Patient was transferred to [facility] for further evaluation, possible stress  test, loop recorder placement. See below for further details.   Discharge Diagnoses:  Active Problems:   Hyperlipidemia associated with type 2 diabetes mellitus ([place])   Essential hypertension, benign   CAD S/P percutaneous coronary angioplasty   Headache   GERD (gastroesophageal reflux disease)   Diabetic peripheral neuropathy associated with type 2 diabetes mellitus (HCC)   Depression   Diabetic neuropathy (HCC)   Syncope   Acute renal failure (HCC)   Occlusion and stenosis of vertebral artery  Syncope -EEG normal  -Echo: LV is increased in a pattern of mild LVH. LV systolic function is [id number]. Wall motion is normal, no regional wall abnormalities. Grade 2 diastolic dysfunction -MRI/MRA head: high-grade atheromatous narrowing at left ICA anterior genu, bilateral M2 branches, and bilateral P2 branches. Thready flow left vertebral artery, suspected high-grade proximal left subclavian stenosis. -CTA neck: Left V3 segment occlusion. Diffuse narrowing and luminal irregularity of the left vertebral artery. CTA showed narrowing and luminal irregularity of the left vertebral artery, dissection vs atherosclerosis.  -Cardiology and neurology following -Loop recorder placed 05/25/2016 -Aggressive management of vascular risk factors including cholesterol, diabetes, HTN  -Switch aspirin to plavix  -Follow up CTA in 6-8 weeks   AKI -Prerenal due to dehydration -Renal US unremarkable -Improved with IVF  Incident finding of diffuse hepatic steatosis on [place] -Need outpatient follow-up. Per patient, he was previously diagnosed with this in the past. Recommended weight loss. Had extensive discussion today regarding diet.   CAD s/p PCI -No complaints of chest pain  -No need for stress test  -Aspirin switched to plavix   HTN -Goal SBP 140 due to significant vertebral artery stenosis  -Continue norvasc,  ARB, BB, imdur  -Had extensive discussion today regarding BP control to decrease risk  factor  DM type 2 -Novolog 70/30, Januvia  -Ha1c 10 -Had extensive discussion today regarding blood sugar control to decrease risk factor  HLD -Had extensive discussion today regarding cholestrol control to decrease risk factor. Intolerant to statins in the past due to myalgias. He is currently refusing any medication in the statin class and plan to follow up with PCP   Depression/anxiety -Continue zoloft, xanax   GERD -Continue PPI   Discharge Instructions  Discharge Instructions    Ambulatory referral to Neurology    Complete by:  As directed    An appointment is requested in approximately: 6 weeks     Allergies as of 05/25/2016      Reactions   Shellfish Allergy Anaphylaxis   Tongue swelling, hives   Ace Inhibitors Other (See Comments), Cough   CKD, renal failure    Invokana [canagliflozin] Other (See Comments)   Syncope / dehydration   Metformin And Related Itching   Pravastatin Sodium    myalgias   Fenofibrate    Body aches   Horse-derived Products    REACTION: Rash   Iodine    ????   Lexapro [escitalopram Oxalate]    Buzzing in ears,headache, felt like a zombie   Lisinopril Cough   Crestor [rosuvastatin] Other (See Comments)   Myalgias   Lipitor [atorvastatin] Other (See Comments)   myalgias      Medication List    STOP taking these medications   aspirin EC 81 MG EC tablet Generic drug:  aspirin   meloxicam 15 MG tablet Commonly known as:  MOBIC     TAKE these medications   ALPRAZolam 0.5 MG tablet Commonly known as:  XANAX TAKE ONE TABLET BY MOUTH TWICE DAILY AS NEEDED   amLODipine 10 MG tablet Commonly known as:  NORVASC Take 1 tablet (10 mg total) by mouth daily. Start taking on:  05/26/2016   clopidogrel 75 MG tablet Commonly known as:  PLAVIX Take 1 tablet (75 mg total) by mouth daily.   furosemide 20 MG tablet Commonly known as:  LASIX TAKE ONE TABLET BY MOUTH ONCE DAILY AS NEEDED What changed:  how much to take  how to take  this  when to take this  additional instructions   gabapentin 300 MG capsule Commonly known as:  NEURONTIN Take 1 capsule (300 mg total) by mouth 3 (three) times daily. What changed:  when to take this   gemfibrozil 600 MG tablet Commonly known as:  LOPID Take 1 tablet (600 mg total) by mouth 2 (two) times daily before a meal. For cholesterol and triglycerides   HYDROcodone-acetaminophen 10-325 MG tablet Commonly known as:  NORCO Take 1-2 tablets by mouth every 8 (eight) hours as needed for moderate pain.   isosorbide mononitrate 30 MG 24 hr tablet Commonly known as:  IMDUR TAKE ONE TABLET BY MOUTH ONCE DAILY   metoprolol tartrate 25 MG tablet Commonly known as:  LOPRESSOR Take 0.5 tablets (12.5 mg total) by mouth 2 (two) times daily.   mometasone 50 MCG/ACT nasal spray Commonly known as:  NASONEX Place 2 sprays into the nose daily. What changed:  when to take this  reasons to take this   NOVOLIN 70/30 RELION (70-30) 100 UNIT/ML injection Generic drug:  insulin NPH-regular Human INJECT 70 UNITS SUBCUTANEOUSLY TWICE DAILY WITH  A  MEAL   omeprazole 40 MG capsule Commonly known as:  PRILOSEC TAKE ONE CAPSULE BY  MOUTH TWICE DAILY HALF AN HOUR BEFORE BREAKFAST AND SUPPER   Pen Needles 31G X 6 MM Misc Use to inject insulin two to three times a day   PROBIOTIC ACIDOPHILUS PO Take 1 capsule by mouth daily.   RED YEAST RICE PO Take 2 tablets by mouth daily.   sertraline 50 MG tablet Commonly known as:  ZOLOFT Take 1 tablet (50 mg total) by mouth daily.   sitaGLIPtin 100 MG tablet Commonly known as:  JANUVIA Take 1 tablet (100 mg total) by mouth daily.   telmisartan 40 MG tablet Commonly known as:  MICARDIS TAKE ONE TABLET BY MOUTH ONCE DAILY (REPLACING  LISINOPRIL)      Follow-up Information    STACKS,WARREN, MD. Schedule an appointment as soon as possible for a visit in 1 week(s).   Specialty:  Family Medicine Contact information: [name] [address] [phone]        Cristopher Peru, MD. Schedule an appointment as soon as possible for a visit in 1 week(s).   Specialty:  Cardiology Contact information: [id number] [place] [address] [phone]        [place] Schedule an appointment as soon as possible for a visit in 6 week(s).   Contact information: [address]     [address] [place] [phone]         Allergies  Allergen Reactions  . Shellfish Allergy Anaphylaxis    Tongue swelling, hives  . Ace Inhibitors Other (See Comments) and Cough    CKD, renal failure   . Invokana [Canagliflozin] Other (See Comments)    Syncope / dehydration  . Metformin And Related Itching  . Pravastatin Sodium     myalgias  . Fenofibrate     Body aches  . Horse-Derived Products     REACTION: Rash  . Iodine     ????  . Lexapro [Escitalopram Oxalate]     Buzzing in ears,headache, felt like a zombie  . Lisinopril Cough  . Crestor [Rosuvastatin] Other (See Comments)    Myalgias   . Lipitor [Atorvastatin] Other (See Comments)    myalgias    Consultations:  Cardiology  Neurology/Stroke team    Procedures/Studies: Dg Chest 2 View  Result Date: 05/21/2016 CLINICAL DATA:  Generalized weakness and body aches and mid chest pain for the past 3 days. Patient also notes right-sided facial swelling. History of coronary artery disease, former smoker. EXAM: CHEST  2 VIEW COMPARISON:  PA and lateral chest x-[place] of February 11, 2016 FINDINGS: The lungs are well-expanded. The interstitial markings are coarse though stable. There is no alveolar infiltrate or pleural effusion. The heart is normal in size. The pulmonary vascularity is not engorged. The mediastinum is normal in width. The bony thorax exhibits no acute abnormality. IMPRESSION: Chronic bronchitic changes. No pneumonia, CHF, nor other acute cardiopulmonary abnormality.  Electronically Signed   By: David  Martinique M.D.   On: 05/21/2016 11:47   Ct Head Wo Contrast  Result Date: 05/21/2016 CLINICAL DATA:  Syncope.  Facial swelling.  Headache. EXAM: CT HEAD WITHOUT CONTRAST CT CERVICAL SPINE WITHOUT CONTRAST TECHNIQUE: Multidetector CT imaging of the head and cervical spine was performed following the standard protocol without intravenous contrast. Multiplanar CT image reconstructions of the cervical spine were also generated. COMPARISON:  None. FINDINGS: CT HEAD FINDINGS Brain: No evidence of acute infarction, hemorrhage, hydrocephalus, extra-axial collection or mass lesion/mass effect. Vascular: No hyperdense vessel or unexpected calcification. Skull:  Negative Sinuses/Orbits: Mild mucosal edema in the ethmoid sinuses. Normal orbital structures. Other: None CT CERVICAL SPINE FINDINGS Alignment: Normal Skull base and vertebrae: Negative for fracture or mass. No acute skeletal abnormality. ACDF with anterior plate C5-6 and [id number]. Soft tissues and spinal canal: Negative for mass or adenopathy. Carotid artery calcification bilaterally. Disc levels:  C2-3:  Mild disc bulging without stenosis C3-4:  Negative C4-5:  Negative C5-6: ACDF with anterior plate. No definite bony fusion. Possible pseudarthrosis. Mild facet degeneration. No significant stenosis C6-7: ACDF with solid interbody fusion. Mild spinal stenosis due to uncinate spurring. Mild foraminal narrowing bilaterally. C7-T1:  Negative Upper chest: Negative Other: None IMPRESSION: Negative CT of the head No acute abnormality in the cervical spine. ACDF C5-6 with probable pseudarthrosis. ACDF with solid fusion at C6-7. Mild spinal and foraminal stenosis at C6-7 due to spurring. Electronically Signed   By: Franchot Gallo M.D.   On: 05/21/2016 12:26   Ct Angio Neck W Or Wo Contrast  Result Date: 05/24/2016 CLINICAL DATA:  Syncopal episodes in visual changes over the past few days. EXAM: CT ANGIOGRAPHY NECK TECHNIQUE: Multidetector CT  imaging of the neck was performed using the standard protocol during bolus administration of intravenous contrast. Multiplanar CT image reconstructions and MIPs were obtained to evaluate the vascular anatomy. Carotid stenosis measurements (when applicable) are obtained utilizing NASCET criteria, using the distal internal carotid diameter as the denominator. CONTRAST:  50 cc Isovue 370 intravenous COMPARISON:  MRA from yesterday FINDINGS: Aortic arch: Atherosclerotic plaque.  Two vessel branching Right carotid system: Mild streak artifact and motion artifact at the proximal common carotid and brachiocephalic level. There is moderate mixed calcified and noncalcified plaque at the common carotid bifurcation with narrowing less than 20%. Noncalcified plaque extends along the proximal ICA without hemodynamic compromise. Negative for dissection or beading. Left carotid system: Predominant noncalcified plaque from the distal common carotid to proximal ICA with stenosis measuring up to 30- 40%. No ulceration or dissection seen. Vertebral arteries: The dominant right vertebral artery is smooth and widely patent. No proximal right subclavian or brachiocephalic stenosis. Plaque at the left subclavian artery ostium with mild stenosis. Apparent high-grade left subclavian narrowing on the previous MRA was artifactual. The left vertebral artery is diffusely attenuated, especially when compared to flow void on cervical spine MRI from 2016. The vessel is occluded at the V3 segment. There is retrograde distal left V4 segment flow feeding the left PICA. The proximal left vertebral artery is not well visualized due to small size and streak artifact. The left vertebral lumen is irregular, which could be from dissection or scattered atherosclerotic plaque. Skeleton: No acute or aggressive finding. Other neck: No visualized mass or adenopathy. Upper chest: Generalized airway thickening. IMPRESSION: 1. Left V3 segment occlusion. Distal  left V4 reconstitution by the basilar with patent left PICA. 2. Diffuse narrowing and luminal irregularity of the left vertebral artery. Correlate for neck pain as dissection or atherosclerosis and underfilling could give this appearance. 3. Mild atherosclerotic narrowing of the left subclavian ostium. No subclavian flow limiting stenosis. 4. Cervical carotid atherosclerosis with 30 to 40 % ICA narrowing on the left. Electronically Signed   By: Monte Fantasia M.D.   On: 05/24/2016 12:48   Ct Cervical Spine Wo Contrast  Result Date: 05/21/2016 CLINICAL DATA:  Syncope.  Facial swelling.  Headache. EXAM: CT HEAD WITHOUT CONTRAST CT CERVICAL SPINE WITHOUT CONTRAST TECHNIQUE: Multidetector CT imaging of the head and cervical spine was performed following the standard protocol without intravenous  contrast. Multiplanar CT image reconstructions of the cervical spine were also generated. COMPARISON:  None. FINDINGS: CT HEAD FINDINGS Brain: No evidence of acute infarction, hemorrhage, hydrocephalus, extra-axial collection or mass lesion/mass effect. Vascular: No hyperdense vessel or unexpected calcification. Skull: Negative Sinuses/Orbits: Mild mucosal edema in the ethmoid sinuses. Normal orbital structures. Other: None CT CERVICAL SPINE FINDINGS Alignment: Normal Skull base and vertebrae: Negative for fracture or mass. No acute skeletal abnormality. ACDF with anterior plate C5-6 and [id number]. Soft tissues and spinal canal: Negative for mass or adenopathy. Carotid artery calcification bilaterally. Disc levels:  C2-3:  Mild disc bulging without stenosis C3-4:  Negative C4-5:  Negative C5-6: ACDF with anterior plate. No definite bony fusion. Possible pseudarthrosis. Mild facet degeneration. No significant stenosis C6-7: ACDF with solid interbody fusion. Mild spinal stenosis due to uncinate spurring. Mild foraminal narrowing bilaterally. C7-T1:  Negative Upper chest: Negative Other: None IMPRESSION: Negative CT of the head No  acute abnormality in the cervical spine. ACDF C5-6 with probable pseudarthrosis. ACDF with solid fusion at C6-7. Mild spinal and foraminal stenosis at C6-7 due to spurring. Electronically Signed   By: Franchot Gallo M.D.   On: 05/21/2016 12:26   [name] Head Wo Contrast  Result Date: 05/23/2016 CLINICAL DATA:  Syncope. EXAM: MRA HEAD WITHOUT CONTRAST TECHNIQUE: Angiographic images of the Circle of Willis were obtained using MRA technique without intravenous contrast. COMPARISON:  None. FINDINGS: No flow seen within the left vertebral artery, which is present based on CT imaging and cervical spine MRI 10/29/2013. MRA neck reported separately. Patent right PICA and left AICA. Right posterior communicating artery. There is bilateral atherosclerotic irregularity of the bilateral PCA is with bilateral high-grade P2 segment stenoses, more proximal on the left. Symmetric carotid arteries. Moderate to advanced narrowing of the left ICA anterior genu. Mild narrowing of the right ICA anterior genu. No major branch occlusion. Bilateral proximal M2 branch moderate to advanced narrowings. IMPRESSION: 1. No visible flow in the left V4 segment. See MRA neck that is reported separately. 2. High-grade atheromatous narrowings at the left ICA anterior genu, bilateral M2 branches, and bilateral P2 branches. Electronically Signed   By: Monte Fantasia M.D.   On: 05/23/2016 12:48   [name] Neck Wo Contrast  Result Date: 05/23/2016 CLINICAL DATA:  Syncopal episode. EXAM: MRA NECK WITHOUT CONTRAST TECHNIQUE: Angiographic images of the neck were obtained using MRA technique without intravenous contrast. COMPARISON:  None. FINDINGS: Time-of-flight imaging was performed with limited signal in the proximal great vessels. Still, there is suggestion of a high-grade proximal left subclavian stenosis. Intermittent, thready flow seen in the left vertebral artery, which is not seen by the level of the C1 ring. This is a new finding when  correlated with cervical spine MRI 10/29/2013 when flow voids were nearly symmetric and normal. Additionally, on ultrasound from 2 days ago, there may have been early reversal of systolic flow on left vertebral insonnation. The right vertebral artery is smooth and widely patent where there is good signal. Bilateral cervical carotid bifurcation atherosclerosis with proximal left ICA narrowing measuring 40%. IMPRESSION: 1. Thready flow within the left vertebral artery which is new compared to 2015 imaging. Suspect a high-grade proximal left subclavian stenosis favoring steal phenomenon. Dissection is the main differential consideration. CTA is recommended to further evaluate. 2. Cervical carotid atherosclerosis with ~ 40% proximal left ICA stenosis. Electronically Signed   By: Monte Fantasia M.D.   On: 05/23/2016 13:00   Mr Brain Wo Contrast  Result Date: 05/22/2016  CLINICAL DATA:  Multiple pre syncopal episodes. Generalized weakness, body aches, right-sided facial pain, and headache for 3 days. EXAM: MRI HEAD WITHOUT CONTRAST TECHNIQUE: Multiplanar, multiecho pulse sequences of the brain and surrounding structures were obtained without intravenous contrast. COMPARISON:  Head CT 05/21/2016 FINDINGS: Brain: There is no evidence of acute infarct, intracranial hemorrhage, mass, midline shift, or extra-axial fluid collection. The ventricles and sulci are normal. A chronic lacunar infarct is present in the left putamen. The brain is otherwise normal in signal for age. Vascular: Abnormal appearance of the distal left vertebral artery may reflect atherosclerosis and decreased flow. Other major intracranial vascular flow voids are preserved. Skull and upper cervical spine: No focal marrow lesion is identified. Sinuses/Orbits: Unremarkable orbits.  No significant sinus disease. Other: None. IMPRESSION: 1. No acute intracranial abnormality. 2. Chronic left basal ganglia lacunar infarct. Electronically Signed   By: Logan Bores M.D.   On: 05/22/2016 07:40   US Renal  Result Date: 05/22/2016 CLINICAL DATA:  Inpatient with acute renal failure. EXAM: RENAL / URINARY TRACT ULTRASOUND COMPLETE COMPARISON:  08/17/2014 CT abdomen/pelvis FINDINGS: Right Kidney: Length: 12.5 cm. Mildly echogenic right kidney. No hydronephrosis. No right renal mass. Incidental diffuse hepatic steatosis in the visualized right liver lobe. Left Kidney: Length: 13.4 cm. Mildly echogenic left kidney. No hydronephrosis. No left renal mass. Bladder: Appears normal for degree of bladder distention. IMPRESSION: 1. No hydronephrosis. 2. Mildly echogenic normal size kidneys, indicating nonspecific renal parenchymal disease of uncertain chronicity. 3. Normal bladder. 4. Incidental diffuse hepatic steatosis. Electronically Signed   By: Ilona Sorrel M.D.   On: 05/22/2016 07:55   US Carotid Bilateral  Result Date: 05/21/2016 EXAM: BILATERAL CAROTID DUPLEX ULTRASOUND TECHNIQUE: Seele scale imaging, color Doppler and duplex ultrasound were performed of bilateral carotid and vertebral arteries in the neck. COMPARISON:  CT 05/21/2016, ultrasound 11/22/2013 FINDINGS: Criteria: Quantification of carotid stenosis is based on velocity parameters that correlate the residual internal carotid diameter with NASCET-based stenosis levels, using the diameter of the distal internal carotid lumen as the denominator for stenosis measurement. The following velocity measurements were obtained: RIGHT ICA:  104 cm/sec CCA:  99 cm/sec SYSTOLIC ICA/CCA RATIO:  1.3 DIASTOLIC ICA/CCA RATIO:  1.9 ECA:  152 cm/sec LEFT ICA:  116 cm/sec CCA:  89 cm/sec SYSTOLIC ICA/CCA RATIO:  1.1 DIASTOLIC ICA/CCA RATIO:  1.2 ECA:  189 cm/sec RIGHT CAROTID ARTERY: Minor echogenic shadowing plaque formation. Nohemodynamically significant right ICA stenosis, velocity elevation,or turbulent flow. Degree of narrowing less than 50% RIGHT VERTEBRAL ARTERY:  Antegrade flow present LEFT CAROTID ARTERY: Similar scattered  minor echogenic plaqueformation. No hemodynamically significant left ICA stenosis,velocity elevation, or turbulent flow. LEFT VERTEBRAL ARTERY:  Antegrade flow present. IMPRESSION: Minor carotid atherosclerosis. No hemodynamically significant ICAstenosis by ultrasound. Electronically Signed   By: Donavan Foil M.D.   On: 05/21/2016 18:16   Ct Maxillofacial Wo Contrast  Result Date: 05/21/2016 CLINICAL DATA:  Generalized weakness, right side facial EXAM: CT MAXILLOFACIAL WITHOUT CONTRAST TECHNIQUE: Multidetector CT imaging of the maxillofacial structures was performed. Multiplanar CT image reconstructions were also generated. A small metallic BB was placed on the right temple in order to reliably differentiate right from left. COMPARISON:  CT head same day FINDINGS: Osseous: No facial fractures are noted. There is right deviation of nasal bony septum. Orbits: No intraorbital hematoma. Bilateral eye globe symmetrical in appearance. Coronal images shows no orbital rim or orbital floor fracture. Sinuses: Mild mucosal thickening bilateral ethmoid air cells. Coronal images shows patent semilunar canal bilaterally.  There is mild mucosal thickening superior aspect of maxillary sinus bilaterally. Soft tissues: No facial fluid collection. The nasal turbinates are unremarkable. Sagittal images shows patent nasopharyngeal and oropharyngeal airway. Limited intracranial: Unremarkable IMPRESSION: 1. No facial fractures of fluid collects mild mucosal thickening bilateral ethmoid air cells. Mild mucosal thickening superior aspect bilateral maxillary sinus. 2. There is right deviation of nasal bony septum. Bilateral semilunar canal is patent. 3. No intraorbital hematoma. No orbital rim or orbital floor fracture. No facial fluid collection. Electronically Signed   By: Lahoma Crocker M.D.   On: 05/21/2016 12:41    Discharge Exam: Vitals:   05/24/16 2037 05/25/16 0507  BP: 130/63 121/62  Pulse: 77 62  Resp:  17  Temp: 98.4 F  (36.9 C) 97.9 F (36.6 C)   Vitals:   05/24/16 0529 05/24/16 1423 05/24/16 2037 05/25/16 0507  BP: (!) 157/87 120/60 130/63 121/62  Pulse: 67 79 77 62  Resp: 18   17  Temp: 97.8 F (36.6 C) 98.5 F (36.9 C) 98.4 F (36.9 C) 97.9 F (36.6 C)  TempSrc: Oral Oral Oral Oral  SpO2: 98% 96% 98% 95%  Weight: 127.9 kg (282 lb)   125.9 kg (277 lb 9.6 oz)  Height:        General: Pt is alert, awake, not in acute distress Cardiovascular: RRR, S1/S2 +, no rubs, no gallops Respiratory: CTA bilaterally, no wheezing, no rhonchi Abdominal: Soft, NT, ND, bowel sounds + Extremities: no edema, no cyanosis    The results of significant diagnostics from this hospitalization (including imaging, microbiology, ancillary and laboratory) are listed below for reference.     Microbiology: No results found for this or any previous visit (from the past 240 hour(s)).   Labs: BNP (last 3 results) No results for input(s): BNP in the last 8760 hours. Basic Metabolic Panel:  Recent Labs Lab 05/21/16 1114 05/22/16 0315 05/23/16 0654 05/25/16 0535  NA 137 135 131* 134*  K 4.6 4.2 4.6 4.4  CL 97* 103 101 99*  CO2 28 24 23 23   GLUCOSE 195* 221* 282* 254*  BUN 42* 37* 27* 30*  CREATININE 2.31* 1.85* 1.19 1.36*  CALCIUM 9.5 8.9 9.7 10.1   Liver Function Tests:  Recent Labs Lab 05/21/16 1114  AST 26  ALT 31  ALKPHOS 61  BILITOT 0.8  PROT 7.5  ALBUMIN 4.1    Recent Labs Lab 05/21/16 1114  LIPASE 41   No results for input(s): AMMONIA in the last 168 hours. CBC:  Recent Labs Lab 05/21/16 1114  WBC 8.7  NEUTROABS 5.5  HGB 15.8  HCT 47.2  MCV 89.7  PLT 238   Cardiac Enzymes:  Recent Labs Lab 05/21/16 1114 05/21/16 1556 05/21/16 2145 05/22/16 0315 05/23/16 0732  TROPONINI 0.05* 0.05* 0.04* 0.04* 0.04*   BNP: Invalid input(s): POCBNP CBG:  Recent Labs Lab 05/24/16 1033 05/24/16 1617 05/24/16 2037 05/25/16 0735 05/25/16 1115  GLUCAP 203* 317* 232* 253* 213*    D-Dimer No results for input(s): DDIMER in the last 72 hours. Hgb A1c No results for input(s): HGBA1C in the last 72 hours. Lipid Profile No results for input(s): CHOL, HDL, LDLCALC, TRIG, CHOLHDL, LDLDIRECT in the last 72 hours. Thyroid function studies No results for input(s): TSH, T4TOTAL, T3FREE, THYROIDAB in the last 72 hours.  Invalid input(s): FREET3 Anemia work up No results for input(s): VITAMINB12, FOLATE, FERRITIN, TIBC, IRON, RETICCTPCT in the last 72 hours. Urinalysis    Component Value Date/Time   COLORURINE YELLOW 05/21/2016 1350  APPEARANCEUR HAZY (A) 05/21/2016 1350   APPEARANCEUR Clear 02/11/2016 1149   LABSPEC 1.019 05/21/2016 1350   PHURINE 5.0 05/21/2016 1350   GLUCOSEU 50 (A) 05/21/2016 1350   HGBUR SMALL (A) 05/21/2016 1350   BILIRUBINUR NEGATIVE 05/21/2016 1350   BILIRUBINUR Negative 02/11/2016 1149   KETONESUR NEGATIVE 05/21/2016 1350   PROTEINUR 100 (A) 05/21/2016 1350   UROBILINOGEN negative 08/13/2014 1725   UROBILINOGEN 0.2 08/07/2013 1747   NITRITE NEGATIVE 05/21/2016 1350   LEUKOCYTESUR NEGATIVE 05/21/2016 1350   LEUKOCYTESUR Negative 02/11/2016 1149   Sepsis Labs Invalid input(s): PROCALCITONIN,  WBC,  LACTICIDVEN Microbiology No results found for this or any previous visit (from the past 240 hour(s)).   Time coordinating discharge: Over 30 minutes  SIGNED:  Dessa Phi, DO [facility] Pager [phone]  If 7PM-7AM, please contact night-coverage [email or web address] Password [email or web address] 05/25/2016, 1:16 PM

## 2016-05-25 NOTE — H&P (View-Only) (Signed)
Reason for Consult:syncope and remote stroke  Referring Physician: [name] is an 66 y.o. male.   HPI: The patient is a 66 yo man with multiple medical problems, including CAD, prior stroke, DM, HTN heart disease, and GERD was admitted and transferred from [facility] for evaluation of recurrent syncope and abnormal CT scan. The patient has had a couple of spells which are not associated with upright posture and no loss of bowel or bladder continence where he will start go out. He states the room color starts to fade and turns yellow. The patient has not had any neuro changes.   PMH: Past Medical History:  Diagnosis Date  . AODM   . Arthritis   . CAD (coronary artery disease)    2010  LAD 50%, RCA 100%.  DES to RCA.  EF 55%  . Cellulitis and abscess rt groin   . CHEST PAIN-UNSPECIFIED   . Diabetes mellitus   . Disorders of iron metabolism   . DISORDERS OF IRON METABOLISM   . GERD (gastroesophageal reflux disease)   . Hyperlipidemia   . Hypertension   . Low serum testosterone level   . Medically noncompliant   . MURMUR     PSHX: Past Surgical History:  Procedure Laterality Date  . BACK SURGERY  2015   ACDF by Dr. [name]  . COLONOSCOPY N/A 10/01/2014   Procedure: COLONOSCOPY;  Surgeon: Daneil Dolin, MD;  Location: [facility];  Service: Endoscopy;  Laterality: N/A;  815  . CORONARY STENT PLACEMENT  2000   By Dr. Olevia Perches  . ESOPHAGOGASTRODUODENOSCOPY     esophagus [facility] remotely at [facility]  . ESOPHAGOGASTRODUODENOSCOPY N/A 10/01/2014   Procedure: ESOPHAGOGASTRODUODENOSCOPY (EGD);  Surgeon: Daneil Dolin, MD;  Location: [facility];  Service: Endoscopy;  Laterality: N/A;  . HERNIA REPAIR  [id number]   umbilical  . LESION REMOVAL     Lip and hand   . NECK SURGERY      FAMHX: Family History  Problem Relation Age of Onset  . Diabetes Father   . Valvular heart disease Father   . Arthritis Father   . Heart disease Father   . Alzheimer's disease Mother   .  Hyperlipidemia Mother   . Hypertension Mother   . Cancer Mother     lung  . Arthritis Mother   . Arthritis Sister     rheumatoid  . Diabetes Sister   . Hypertension Sister   . Hyperlipidemia Sister   . Dementia Maternal Aunt   . Dementia Maternal Uncle   . Heart disease Maternal Uncle   . Cancer Paternal Uncle     stomach  . Colon cancer Neg Hx   . Liver disease Neg Hx     Social History:  reports that he has been smoking Cigarettes.  He has a 12.75 pack-year smoking history. He has never used smokeless tobacco. He reports that he does not drink alcohol or use drugs.  Allergies:  Allergies  Allergen Reactions  . Shellfish Allergy Anaphylaxis    Tongue swelling, hives  . Ace Inhibitors Other (See Comments) and Cough    CKD, renal failure   . Invokana [Canagliflozin] Other (See Comments)    Syncope / dehydration  . Metformin And Related Itching  . Pravastatin Sodium     myalgias  . Fenofibrate     Body aches  . Horse-Derived Products     REACTION: Rash  . Iodine     ????  .  Lexapro [Escitalopram Oxalate]     Buzzing in ears,headache, felt like a zombie  . Lisinopril Cough  . Crestor [Rosuvastatin] Other (See Comments)    Myalgias   . Lipitor [Atorvastatin] Other (See Comments)    myalgias    Medications: I have reviewed the patient's current medications.  [name] Head Wo Contrast  Result Date: 05/23/2016 CLINICAL DATA:  Syncope. EXAM: MRA HEAD WITHOUT CONTRAST TECHNIQUE: Angiographic images of the Circle of Willis were obtained using MRA technique without intravenous contrast. COMPARISON:  None. FINDINGS: No flow seen within the left vertebral artery, which is present based on CT imaging and cervical spine MRI 10/29/2013. MRA neck reported separately. Patent right PICA and left AICA. Right posterior communicating artery. There is bilateral atherosclerotic irregularity of the bilateral PCA is with bilateral high-grade P2 segment stenoses, more proximal on the left.  Symmetric carotid arteries. Moderate to advanced narrowing of the left ICA anterior genu. Mild narrowing of the right ICA anterior genu. No major branch occlusion. Bilateral proximal M2 branch moderate to advanced narrowings. IMPRESSION: 1. No visible flow in the left V4 segment. See MRA neck that is reported separately. 2. High-grade atheromatous narrowings at the left ICA anterior genu, bilateral M2 branches, and bilateral P2 branches. Electronically Signed   By: Monte Fantasia M.D.   On: 05/23/2016 12:48   [name] Neck Wo Contrast  Result Date: 05/23/2016 CLINICAL DATA:  Syncopal episode. EXAM: MRA NECK WITHOUT CONTRAST TECHNIQUE: Angiographic images of the neck were obtained using MRA technique without intravenous contrast. COMPARISON:  None. FINDINGS: Time-of-flight imaging was performed with limited signal in the proximal great vessels. Still, there is suggestion of a high-grade proximal left subclavian stenosis. Intermittent, thready flow seen in the left vertebral artery, which is not seen by the level of the C1 ring. This is a new finding when correlated with cervical spine MRI 10/29/2013 when flow voids were nearly symmetric and normal. Additionally, on ultrasound from 2 days ago, there may have been early reversal of systolic flow on left vertebral insonnation. The right vertebral artery is smooth and widely patent where there is good signal. Bilateral cervical carotid bifurcation atherosclerosis with proximal left ICA narrowing measuring 40%. IMPRESSION: 1. Thready flow within the left vertebral artery which is new compared to 2015 imaging. Suspect a high-grade proximal left subclavian stenosis favoring steal phenomenon. Dissection is the main differential consideration. CTA is recommended to further evaluate. 2. Cervical carotid atherosclerosis with ~ 40% proximal left ICA stenosis. Electronically Signed   By: Monte Fantasia M.D.   On: 05/23/2016 13:00    ROS  As stated in the HPI and negative  for all other systems.  Physical Exam  Vitals:Blood pressure (!) 157/87, pulse 67, temperature 97.8 F (36.6 C), temperature source Oral, resp. rate 18, height 6\' 2"  (1.88 m), weight 282 lb (127.9 kg), SpO2 98 %.  Well appearing NAD HEENT: Unremarkable Neck:  No JVD, no thyromegally Lymphatics:  No adenopathy Back:  No CVA tenderness Lungs:  Clear HEART:  Regular rate rhythm, no murmurs, no rubs, no clicks Abd:  Flat, positive bowel sounds, no organomegally, no rebound, no guarding Ext:  2 plus pulses, no edema, no cyanosis, no clubbing Skin:  No rashes no nodules Neuro:  CN II through XII intact, motor grossly intact  Assessment/Plan: 1. Syncope - the etiology is unclear. It could be very atypical autonomic dysfunction but sounds more to me like sinus node dysfunction. Would avoid sinus nodal blocking drugs and will plan to place  an ILR tomorrow. 2. Remote stroke - CT scan reviewed. ILR is indicated. 3. CAD - he denies anginal symptoms. No change in meds. I do not think this patient needs a stress test. Will cancel NPO.  Carleene Overlie TaylorMD 05/24/2016, 9:31 AM

## 2016-05-25 NOTE — Progress Notes (Addendum)
Inpatient Diabetes Program Recommendations  AACE/ADA: New Consensus Statement on Inpatient Glycemic Control (2015)  Target Ranges:  Prepandial:   less than 140 mg/dL      Peak postprandial:   less than 180 mg/dL (1-2 hours)      Critically ill patients:  140 - 180 mg/dL   Lab Results  Component Value Date   GLUCAP 213 (H) 05/25/2016   HGBA1C 10.0 (H) 05/21/2016   Results for LESTER, IMMING (MRN [id number]) as of 05/25/2016 13:10  Ref. Range 05/24/2016 10:33 05/24/2016 16:17 05/24/2016 20:37 05/25/2016 07:35 05/25/2016 11:15  Glucose-Capillary Latest Ref Range: 65 - 99 mg/dL 203 (H) 317 (H) 232 (H) 253 (H) 213 (H)   Review of Glycemic Control  Diabetes history: DM2, BUN and creatinine elevated Outpatient Diabetes medications: Novolin Mix 70/30 70 units BID, Januvia 100 mg daily Current orders for Inpatient glycemic control: Novolog 70/30 mix 70 units BID, Novolog 0-9 units TIDAC and 0-5 units QHS sensitive correction  Inpatient Diabetes Program Recommendations:    A1C of 10% = average CBG's of 240 mg/dL over 2-3 months.  This is consistent with CBG's over past 24 hours.    Please consider increasing Novolog 70/30 Mix to 75 units BID and increasing correction scale to moderate (Novolog 0-15 units TIDAC and 0-5 units QHS).  Thank you,  Windy Carina, RN, MSN Diabetes Coordinator Inpatient Diabetes Program [phone] (Team Pager)

## 2016-05-25 NOTE — Discharge Instructions (Signed)
Recommendations for Outpatient Follow-up:  1. Follow up with PCP in 1 week 2. Follow up with Cardiology in 1 week 3. Follow up with Neurology in 6 weeks. Ambulator referral placed. Need repeat CTA neck in 6-8 weeks.  4. Please obtain BMP in 1 week  5. Need better glycemic control. Ha1c 10. Follow up with PCP.  6. Need better cholesterol control. Has intolerance to pravachol, lipitor, crestor. Follow up with PCP.  7. Continue blood pressure control. Currently 121/62 at time of discharge.  8. Stop asprin and start plavix  9. Incident finding of diffuse hepatic steatosis on ultrasound. Need outpatient follow-up.    === Diabetes Mellitus and Food It is important for you to manage your blood sugar (glucose) level. Your blood glucose level can be greatly affected by what you eat. Eating healthier foods in the appropriate amounts throughout the day at about the same time each day will help you control your blood glucose level. It can also help slow or prevent worsening of your diabetes mellitus. Healthy eating may even help you improve the level of your blood pressure and reach or maintain a healthy weight. General recommendations for healthful eating and cooking habits include:  Eating meals and snacks regularly. Avoid going long periods of time without eating to lose weight.  Eating a diet that consists mainly of plant-based foods, such as fruits, vegetables, nuts, legumes, and whole grains.  Using low-heat cooking methods, such as baking, instead of high-heat cooking methods, such as deep frying. Work with your dietitian to make sure you understand how to use the Nutrition Facts information on food labels. How can food affect me? Carbohydrates  Carbohydrates affect your blood glucose level more than any other type of food. Your dietitian will help you determine how many carbohydrates to eat at each meal and teach you how to count carbohydrates. Counting carbohydrates is important to keep your  blood glucose at a healthy level, especially if you are using insulin or taking certain medicines for diabetes mellitus. Alcohol  Alcohol can cause sudden decreases in blood glucose (hypoglycemia), especially if you use insulin or take certain medicines for diabetes mellitus. Hypoglycemia can be a life-threatening condition. Symptoms of hypoglycemia (sleepiness, dizziness, and disorientation) are similar to symptoms of having too much alcohol. If your health care provider has given you approval to drink alcohol, do so in moderation and use the following guidelines:  Women should not have more than one drink per day, and men should not have more than two drinks per day. One drink is equal to:  12 oz of beer.  5 oz of wine.  1 oz of hard liquor.  Do not drink on an empty stomach.  Keep yourself hydrated. Have water, diet soda, or unsweetened iced tea.  Regular soda, juice, and other mixers might contain a lot of carbohydrates and should be counted. What foods are not recommended? As you make food choices, it is important to remember that all foods are not the same. Some foods have fewer nutrients per serving than other foods, even though they might have the same number of calories or carbohydrates. It is difficult to get your body what it needs when you eat foods with fewer nutrients. Examples of foods that you should avoid that are high in calories and carbohydrates but low in nutrients include:  Trans fats (most processed foods list trans fats on the Nutrition Facts label).  Regular soda.  Juice.  Candy.  Sweets, such as cake, pie, doughnuts, and  cookies.  Fried foods. What foods can I eat? Eat nutrient-rich foods, which will nourish your body and keep you healthy. The food you should eat also will depend on several factors, including:  The calories you need.  The medicines you take.  Your weight.  Your blood glucose level.  Your blood pressure level.  Your cholesterol  level. You should eat a variety of foods, including:  Protein.  Lean cuts of meat.  Proteins low in saturated fats, such as fish, egg whites, and beans. Avoid processed meats.  Fruits and vegetables.  Fruits and vegetables that may help control blood glucose levels, such as apples, mangoes, and yams.  Dairy products.  Choose fat-free or low-fat dairy products, such as milk, yogurt, and cheese.  Grains, bread, pasta, and rice.  Choose whole grain products, such as multigrain bread, whole oats, and brown rice. These foods may help control blood pressure.  Fats.  Foods containing healthful fats, such as nuts, avocado, olive oil, canola oil, and fish. Does everyone with diabetes mellitus have the same meal plan? Because every person with diabetes mellitus is different, there is not one meal plan that works for everyone. It is very important that you meet with a dietitian who will help you create a meal plan that is just right for you. This information is not intended to replace advice given to you by your health care provider. Make sure you discuss any questions you have with your health care provider. Document Released: 01/29/2005 Document Revised: 10/10/2015 Document Reviewed: 03/31/2013 Elsevier Interactive Patient Education  2017 [employer]     Near-Syncope Introduction Near-syncope is when you suddenly become weak or dizzy, or you feel like you might pass out (faint). During an episode of near-syncope, you may:  Feel dizzy or light-headed.  Feel nauseous.  See all white or all black in your field of vision.  Have cold, clammy skin. This condition is caused by a sudden decrease in blood flow to the brain. This decrease can result from various causes, but most of those causes are not dangerous. However, near-syncope can be a sign of a serious medical problem, so it is important to seek medical care. If you fainted, get medical help right away.Call your local emergency  services (911 in the [place] Do not drive yourself to the hospital. Follow these instructions at home: Pay attention to any changes in your symptoms. Take these actions to help with your condition:  Have someone stay with you until you feel stable.  Do not drive, use machinery, or play sports until your health care provider says it is okay.  Keep all follow-up visits as told by your health care provider. This is important.  If you start to feel like you might faint, lie down right away and raise (elevate) your feet above the level of your heart. Breathe deeply and steadily. Wait until all of the symptoms have passed.  Drink enough fluid to keep your urine clear or pale yellow.  If you are taking blood pressure or heart medicine, get up slowly and take several minutes to sit and then stand. This can reduce dizziness.  Take over-the-counter and prescription medicines only as told by your health care provider. Get help right away if:  You have a severe headache.  You have unusual pain in your chest, abdomen, or back.  You are bleeding from your mouth or rectum, or you have black or tarry stool.  You have a very fast or irregular heartbeat (palpitations).  You faint once or repeatedly.  You have a seizure.  You are confused.  You have trouble walking.  You have severe weakness.  You have vision problems. These symptoms may represent a serious problem that is an emergency. Do not wait to see if your symptoms will go away. Get medical help right away. Call your local emergency services (911 in the [place] Do not drive yourself to the hospital.  This information is not intended to replace advice given to you by your health care provider. Make sure you discuss any questions you have with your health care provider. Document Released: 05/04/2005 Document Revised: 10/10/2015 Document Reviewed: 01/16/2015  2017 Elsevier   Syncope Syncope is when you temporarily lose consciousness.  Syncope may also be called fainting or passing out. It is caused by a sudden decrease in blood flow to the brain. Even though most causes of syncope are not dangerous, syncope can be a sign of a serious medical problem. Signs that you may be about to faint include:  Feeling dizzy or light-headed.  Feeling nauseous.  Seeing all white or all black in your field of vision.  Having cold, clammy skin. If you fainted, get medical help right away.Call your local emergency services (911 in the [place] Do not drive yourself to the hospital. Follow these instructions at home: Pay attention to any changes in your symptoms. Take these actions to help with your condition:  Have someone stay with you until you feel stable.  Do not drive, use machinery, or play sports until your health care provider says it is okay.  Keep all follow-up visits as told by your health care provider. This is important.  If you start to feel like you might faint, lie down right away and raise (elevate) your feet above the level of your heart. Breathe deeply and steadily. Wait until all of the symptoms have passed.  Drink enough fluid to keep your urine clear or pale yellow.  If you are taking blood pressure or heart medicine, get up slowly and take several minutes to sit and then stand. This can reduce dizziness.  Take over-the-counter and prescription medicines only as told by your health care provider. Get help right away if:  You have a severe headache.  You have unusual pain in your chest, abdomen, or back.  You are bleeding from your mouth or rectum, or you have black or tarry stool.  You have a very fast or irregular heartbeat (palpitations).  You have pain with breathing.  You faint once or repeatedly.  You have a seizure.  You are confused.  You have trouble walking.  You have severe weakness.  You have vision problems. These symptoms may represent a serious problem that is an emergency. Do not  wait to see if your symptoms will go away. Get medical help right away. Call your local emergency services (911 in the [place] Do not drive yourself to the hospital.  This information is not intended to replace advice given to you by your health care provider. Make sure you discuss any questions you have with your health care provider. Document Released: 05/04/2005 Document Revised: 10/10/2015 Document Reviewed: 01/16/2015 Elsevier Interactive Patient Education  2017 [employer].

## 2016-05-25 NOTE — Interval H&P Note (Signed)
History and Physical Interval Note:  05/25/2016 9:49 AM  [name] [name]  has presented today for surgery, with the diagnosis of syncope  The various methods of treatment have been discussed with the patient and family. After consideration of risks, benefits and other options for treatment, the patient has consented to  Procedure(s): Loop Recorder Insertion (N/A) as a surgical intervention .  The patient's history has been reviewed, patient examined, no change in status, stable for surgery.  I have reviewed the patient's chart and labs.  Questions were answered to the patient's satisfaction.     Cristopher Peru

## 2016-05-25 NOTE — Progress Notes (Signed)
Pt discharged to home via w/c, condition stable, d/c instructions complete, accompanied by spouse.  Edward Qualia RN

## 2016-05-26 ENCOUNTER — Ambulatory Visit (INDEPENDENT_AMBULATORY_CARE_PROVIDER_SITE_OTHER): Payer: Medicare Other | Admitting: Family

## 2016-05-26 ENCOUNTER — Encounter: Payer: Self-pay | Admitting: Family

## 2016-05-26 VITALS — BP 112/69 | HR 83 | Temp 96.2°F | Ht 74.0 in | Wt 281.0 lb

## 2016-05-26 DIAGNOSIS — E1169 Type 2 diabetes mellitus with other specified complication: Secondary | ICD-10-CM | POA: Diagnosis not present

## 2016-05-26 DIAGNOSIS — R55 Syncope and collapse: Secondary | ICD-10-CM | POA: Diagnosis not present

## 2016-05-26 DIAGNOSIS — E1142 Type 2 diabetes mellitus with diabetic polyneuropathy: Secondary | ICD-10-CM

## 2016-05-26 DIAGNOSIS — Z09 Encounter for follow-up examination after completed treatment for conditions other than malignant neoplasm: Secondary | ICD-10-CM | POA: Diagnosis not present

## 2016-05-26 DIAGNOSIS — Z794 Long term (current) use of insulin: Secondary | ICD-10-CM

## 2016-05-26 DIAGNOSIS — E785 Hyperlipidemia, unspecified: Secondary | ICD-10-CM | POA: Diagnosis not present

## 2016-05-26 MED ORDER — DULAGLUTIDE 0.75 MG/0.5ML ~~LOC~~ SOAJ: 0.7500 mg | SUBCUTANEOUS | 3 refills | Status: DC

## 2016-05-26 MED ORDER — SITAGLIPTIN PHOSPHATE 100 MG PO TABS: 100.0000 mg | ORAL_TABLET | Freq: Every day | ORAL | 0 refills | Status: DC

## 2016-05-26 MED ORDER — PITAVASTATIN CALCIUM 2 MG PO TABS: 2.0000 mg | ORAL_TABLET | Freq: Every day | ORAL | 1 refills | Status: DC

## 2016-05-26 NOTE — Patient Instructions (Signed)
Cholesterol Cholesterol is a fat. Your body needs a small amount of cholesterol. Cholesterol (plaque) may build up in your blood vessels (arteries). That makes you more likely to have a heart attack or stroke. You cannot feel your cholesterol level. Having a blood test is the only way to find out if your level is high. Keep your test results. Work with your doctor to keep your cholesterol at a good level. What do the results mean?  Total cholesterol is how much cholesterol is in your blood.  LDL is bad cholesterol. This is the type that can build up. Try to have low LDL.  HDL is good cholesterol. It cleans your blood vessels and carries LDL away. Try to have high HDL.  Triglycerides are fat that the body can store or burn for energy. What are good levels of cholesterol?  Total cholesterol below 200.  LDL below 100 is good for people who have health risks. LDL below 70 is good for people who have very high risks.  HDL above 40 is good. It is best to have HDL of 60 or higher.  Triglycerides below 150. How can I lower my cholesterol? Diet  Follow your diet program as told by your doctor.  Choose fish, white meat chicken, or turkey that is roasted or baked. Try not to eat red meat, fried foods, sausage, or lunch meats.  Eat lots of fresh fruits and vegetables.  Choose whole grains, beans, pasta, potatoes, and cereals.  Choose olive oil, corn oil, or canola oil. Only use small amounts.  Try not to eat butter, mayonnaise, shortening, or palm kernel oils.  Try not to eat foods with trans fats.  Choose low-fat or nonfat dairy foods.  Drink skim or nonfat milk.  Eat low-fat or nonfat yogurt and cheeses.  Try not to drink whole milk or cream.  Try not to eat ice cream, egg yolks, or full-fat cheeses.  Healthy desserts include angel food cake, ginger snaps, animal crackers, hard candy, popsicles, and low-fat or nonfat frozen yogurt. Try not to eat pastries, cakes, pies, and  cookies. Exercise  Follow your exercise program as told by your doctor.  Be more active. Try gardening, walking, and taking the stairs.  Ask your doctor about ways that you can be more active. Medicine  Take over-the-counter and prescription medicines only as told by your doctor. This information is not intended to replace advice given to you by your health care provider. Make sure you discuss any questions you have with your health care provider. Document Released: 07/31/2008 Document Revised: 12/04/2015 Document Reviewed: 11/14/2015 Elsevier Interactive Patient Education  2017 Elsevier Inc.  

## 2016-05-26 NOTE — Progress Notes (Signed)
Subjective:    Patient ID: [name] [name], male    DOB: [birth year], 66 y.o.   MRN: [id number]  Pt presents to the office today for hospital follow up. Pt was admitted to the Hospital on 05/21/16 and discharged on 05/25/16 for Syncopal episode.  PT's MRAbrain showed No visible flow in the left V4 segment. High-grade atheromatous narrowings at the left ICA anterior genu, bilateral M2 branches, and bilateral P2 branches. MRA of the neck showedThready flow within the left vertebral artery which is new compared to 2015 imaging. Suspect a high-grade proximal left subclavian stenosis favoring steal phenomenon. Pt is to make Cardiologists and Neurologists appt. Pt had a loop recorded placed yesterday. Doing well.  Diabetes  He presents for his follow-up diabetic visit. He has type 2 diabetes mellitus. There are no hypoglycemic associated symptoms. Associated symptoms include foot paresthesias. Pertinent negatives for diabetes include no blurred vision. Symptoms are worsening. Diabetic complications include nephropathy and peripheral neuropathy. Risk factors for coronary artery disease include male sex, obesity, dyslipidemia, diabetes mellitus and family history. Current diabetic treatment includes oral agent (triple therapy). He is compliant with treatment all of the time. He is following a generally unhealthy diet. His breakfast blood glucose range is generally >200 mg/dl. Eye exam is current.   HgA1C 10   Review of Systems  Eyes: Negative for blurred vision.  All other systems reviewed and are negative.      Objective:   Physical Exam  Constitutional: He is oriented to person, place, and time. He appears well-developed and well-nourished. No distress.  HENT:  Head: Normocephalic.  Right Ear: External ear normal.  Left Ear: External ear normal.  Nose: Nose normal.  Mouth/Throat: Oropharynx is clear and moist.  Eyes: Pupils are equal, round, and reactive to light. Right eye exhibits no discharge.  Left eye exhibits no discharge.  Neck: Normal range of motion. Neck supple. No thyromegaly present.  Cardiovascular: Normal rate, regular rhythm, normal heart sounds and intact distal pulses.   No murmur heard. Pulmonary/Chest: Effort normal and breath sounds normal. No respiratory distress. He has no wheezes.  Abdominal: Soft. Bowel sounds are normal. He exhibits no distension. There is no tenderness.  Musculoskeletal: Normal range of motion. He exhibits no edema or tenderness.  Neurological: He is alert and oriented to person, place, and time. He has normal reflexes. No cranial nerve deficit.  Skin: Skin is warm and dry. No rash noted. No erythema.  Psychiatric: He has a normal mood and affect. His behavior is normal. Judgment and thought content normal.  Vitals reviewed.     BP 112/69   Pulse 83   Temp (!) 96.2 F (35.7 C) (Oral)   Ht _0  (1.88 m)   Wt 281 lb (127.5 kg)   BMI 36.08 kg/m      Assessment & Plan:  1. Syncope, unspecified syncope type - CBC with Differential/Platelet - BMP8+EGFR  2. Diabetic peripheral neuropathy associated with type 2 diabetes mellitus (HCC) - CBC with Differential/Platelet - BMP8+EGFR  3. Hospital discharge follow-up - CBC with Differential/Platelet - BMP8+EGFR  4. Diabetic polyneuropathy associated with type 2 diabetes mellitus (HCC) - CBC with Differential/Platelet - BMP8+EGFR - sitaGLIPtin (JANUVIA) 100 MG tablet; Take 1 tablet (100 mg total) by mouth daily.  Dispense: 35 tablet; Refill: 0 - Dulaglutide (TRULICITY) 1.69 CV/8.9FY SOPN; Inject 0.75 mg into the skin once a week.  Dispense: 12 pen; Refill: 3  5. Hyperlipidemia associated with type 2 diabetes mellitus ([place]) -Pt started on  Livalo today and stopped Lopid - CBC with Differential/Platelet - BMP8+EGFR - sitaGLIPtin (JANUVIA) 100 MG tablet; Take 1 tablet (100 mg total) by mouth daily.  Dispense: 35 tablet; Refill: 0 - Dulaglutide (TRULICITY) 3.21 YY/4.8GN SOPN; Inject 0.75  mg into the skin once a week.  Dispense: 12 pen; Refill: 3 - Pitavastatin Calcium (LIVALO) 2 MG TABS; Take 1 tablet (2 mg total) by mouth daily.  Dispense: 90 tablet; Refill: 1  6. Type 2 diabetes mellitus with diabetic polyneuropathy, with long-term current use of insulin ([place]) -Pt's Januvia reordered today and started on Trulicity 0.03BC -Low carb diet -Pt needs appt with Tammy asap! - CBC with Differential/Platelet - BMP8+EGFR - sitaGLIPtin (JANUVIA) 100 MG tablet; Take 1 tablet (100 mg total) by mouth daily.  Dispense: 35 tablet; Refill: 0 - Dulaglutide (TRULICITY) 4.88 QB/1.6XI SOPN; Inject 0.75 mg into the skin once a week.  Dispense: 12 pen; Refill: 3  Keep follow up appt with Cardiologists and Neurobiologists   [name], FNP

## 2016-05-27 LAB — CBC WITH DIFFERENTIAL/PLATELET
Basophils Absolute: 0.1 10*3/uL (ref 0.0–0.2)
Basos: 1 %
EOS (ABSOLUTE): 0.2 10*3/uL (ref 0.0–0.4)
Eos: 2 %
Hematocrit: 44.5 % (ref 37.5–51.0)
Hemoglobin: 15.4 g/dL (ref 13.0–17.7)
Immature Grans (Abs): 0.1 10*3/uL (ref 0.0–0.1)
Immature Granulocytes: 1 %
Lymphocytes Absolute: 2.3 10*3/uL (ref 0.7–3.1)
Lymphs: 23 %
MCH: 29.7 pg (ref 26.6–33.0)
MCHC: 34.6 g/dL (ref 31.5–35.7)
MCV: 86 fL (ref 79–97)
Monocytes Absolute: 1 10*3/uL — ABNORMAL HIGH (ref 0.1–0.9)
Monocytes: 10 %
Neutrophils Absolute: 6.6 10*3/uL (ref 1.4–7.0)
Neutrophils: 63 %
Platelets: 279 10*3/uL (ref 150–379)
RBC: 5.18 x10E6/uL (ref 4.14–5.80)
RDW: 13.9 % (ref 12.3–15.4)
WBC: 10.1 10*3/uL (ref 3.4–10.8)

## 2016-05-27 LAB — BMP8+EGFR
BUN/Creatinine Ratio: 27 — ABNORMAL HIGH (ref 10–24)
BUN: 34 mg/dL — ABNORMAL HIGH (ref 8–27)
CO2: 21 mmol/L (ref 18–29)
Calcium: 10.2 mg/dL (ref 8.6–10.2)
Chloride: 96 mmol/L (ref 96–106)
Creatinine, Ser: 1.27 mg/dL (ref 0.76–1.27)
GFR calc Af Amer: 68 mL/min/{1.73_m2} (ref >59)
GFR calc non Af Amer: 59 mL/min/{1.73_m2} — ABNORMAL LOW (ref >59)
Glucose: 275 mg/dL — ABNORMAL HIGH (ref 65–99)
Potassium: 4.8 mmol/L (ref 3.5–5.2)
Sodium: 136 mmol/L (ref 134–144)

## 2016-05-28 ENCOUNTER — Encounter (INDEPENDENT_AMBULATORY_CARE_PROVIDER_SITE_OTHER): Payer: Self-pay

## 2016-05-28 ENCOUNTER — Ambulatory Visit (INDEPENDENT_AMBULATORY_CARE_PROVIDER_SITE_OTHER): Payer: Medicare Other | Admitting: Pharmacist

## 2016-05-28 ENCOUNTER — Encounter: Payer: Self-pay | Admitting: Pharmacist

## 2016-05-28 VITALS — BP 118/68 | HR 76 | Ht 74.0 in | Wt 284.0 lb

## 2016-05-28 DIAGNOSIS — E785 Hyperlipidemia, unspecified: Secondary | ICD-10-CM

## 2016-05-28 DIAGNOSIS — E1169 Type 2 diabetes mellitus with other specified complication: Secondary | ICD-10-CM

## 2016-05-28 DIAGNOSIS — Z794 Long term (current) use of insulin: Secondary | ICD-10-CM | POA: Diagnosis not present

## 2016-05-28 DIAGNOSIS — E1142 Type 2 diabetes mellitus with diabetic polyneuropathy: Secondary | ICD-10-CM

## 2016-05-28 MED ORDER — INSULIN NPH ISOPHANE & REGULAR (70-30) 100 UNIT/ML ~~LOC~~ SUSP: 100.0000 [IU] | Freq: Two times a day (BID) | SUBCUTANEOUS | 0 refills | Status: DC

## 2016-05-28 NOTE — Patient Instructions (Addendum)
Stop gemfibrozil  Hold telmisartan until further evaluation of blood pressure / syncope   Continue Relion 70/30 - 100 units twice a day before breakfast and supper.    Check blood glucose 3 times a day.  Check blood glucose before lunch - if over 120 then give Humalog or short acting insulin - then give supplemental insulin dose based on this equation BG - 120 / 15

## 2016-05-28 NOTE — Progress Notes (Signed)
Patient ID: [name] [name], male   DOB: Oct 29, 1950, 66 y.o.   MRN: [id number]  Subjective:    [name] [name] is a 66 y.o. male who presents for medication management and  follow-up of Type 2 diabetes mellitus.    [name] [name] was recently hospitalized from 05/21/16 to 05/25/16 for syncopal episode.  During hospitalization patient was evaluated by cardiologist.  A loop recorder was placed.  [name] [name] is somewhat anxious about his BP and HR dropping again though he states that he has not had a syncopal episode since leaving the hospital.   He has had BP that was low around 87/40's.  He drank pedialyte and BP improved to 129/64.  Diabetes - patient just started Trulicity 0.75mg  SQ weekly.  He also just restarted Januvia after being out for about 1 week.  Other meds for diabetes include - Relion Mix 70/30 100 units bid (per patient was dose he was told to take when leaving the hospital).  He also injects short acting insulin as needed for elevated BG based on equation BG - 120 /15.   BG ranges from 199-300  Known diabetic complications: peripheral neuropathy and cardiovascular disease Cardiovascular risk factors: advanced age (older than 33 for men, 12 for women), diabetes mellitus, dyslipidemia, hypertension, male gender, obesity (BMI >= 30 kg/m2), sedentary lifestyle and smoking/ tobacco exposure   Current diabetic medications include  Novolog 70/30 mix insulin 65 - 75 units bid and januvia 100mg  1 tablet daily.   Eye exam current (within one year): yes Current diet: in general, an "unhealthy" diet; "I was not counting CHO during holidays"  Current exercise: none  Current monitoring regimen:  Checks 2-3 times daily.  Ranges from 120's to over 300 -  Patient reported   Is He on ACE inhibitor or angiotensin II receptor blocker?  Yes telmisartan 40mg  qd   The following portions of the patient's history were reviewed and updated as appropriate: allergies, current medications, past family history, past  medical history, past social history, past surgical history and problem list.  Objective:    BP 118/68   Pulse 76   Ht 6\' 2"  (1.88 m)   Wt 284 lb (128.8 kg)   BMI 36.46 kg/m   A1c = 10.0% (05/21/2016) A1c = 7.7% (02/19/2016) A1c = 6.5% (09/2015)  Lab Review Glucose (mg/dL)  Date Value  05/26/2016 275 (H)   Glucose, Bld (mg/dL)  Date Value  05/25/2016 254 (H)  05/23/2016 282 (H)  05/22/2016 221 (H)   CO2 (mmol/L)  Date Value  05/26/2016 21  05/25/2016 23  05/23/2016 23   BUN (mg/dL)  Date Value  05/26/2016 34 (H)  05/25/2016 30 (H)  05/23/2016 27 (H)  05/22/2016 37 (H)  02/11/2016 19  09/17/2015 25   Creatinine, Ser (mg/dL)  Date Value  05/26/2016 1.27  05/25/2016 1.36 (H)  05/23/2016 1.19     Assessment:    Diabetes Mellitus type II, under variable control  HTN - at goal today Hyperlipidemia - patient was still taking gemfibrozil but this was suppose to be stopped when Livalo started  Plan:    1.  Rx changes:   Continue Relion Mix 70/30 - injection 100 unit BID  Also encouraged him to use Novolog / Humalog insulin to inject at lunch based on equantion BG - 120 / 15  Stop gemfibrozil and red yeast rice; continue Livalo (as taking both is more likely to cause myalgias which patient has had when previsouly taking statins)  Hold telmisartan  until after follow up with cardiol 2.  Check BG 3 times daily 3.  Education: Reviewed 'ABCs' of diabetes management (respective goals in parentheses):  A1C (<7), blood pressure (<130/80), and cholesterol (LDL <100). 4.  Reviewed CHO counting diet. Reviewed patient's dietary choices.  5.  Follow up: 3 to 4 weeks.   Cherre Robins, PharmD, CPP, CDE

## 2016-06-01 ENCOUNTER — Ambulatory Visit
Admission: RE | Admit: 2016-06-01 | Discharge: 2016-06-01 | Disposition: A | Discharge status: Home / Self Care | Payer: Medicare Other | Source: Ambulatory Visit | Attending: Nurse Practitioner | Admitting: Nurse Practitioner

## 2016-06-01 ENCOUNTER — Telehealth: Payer: Self-pay | Admitting: Internal Medicine

## 2016-06-01 ENCOUNTER — Ambulatory Visit: Payer: Medicare Other | Admitting: Family Medicine

## 2016-06-01 DIAGNOSIS — M5126 Other intervertebral disc displacement, lumbar region: Secondary | ICD-10-CM

## 2016-06-01 DIAGNOSIS — M47816 Spondylosis without myelopathy or radiculopathy, lumbar region: Secondary | ICD-10-CM

## 2016-06-01 NOTE — Telephone Encounter (Signed)
Will forward to Dr Lovena Le and his nurse, Renae to address when they return to the office.  FYI: the pt is taking Plavix 75 mg daily.

## 2016-06-01 NOTE — Discharge Instructions (Signed)

## 2016-06-01 NOTE — Telephone Encounter (Signed)
Roselie Awkward from [place] is calling on behalf of patient and needs clearance from Dr. Lovena Le to have patient stop Plavix for 5 days for an injection. She is faxing over the paperwork. Thanks.

## 2016-06-02 NOTE — Telephone Encounter (Signed)
Ok to hold plavix for 5 days. GT 

## 2016-06-05 ENCOUNTER — Ambulatory Visit: Payer: Medicare Other

## 2016-06-05 NOTE — Telephone Encounter (Signed)
Sent clearance documents to medical records to be faxed to [facility] [phone]. Dr. Lovena Le stated "Ok to hold Plavix for 5 days".

## 2016-06-08 ENCOUNTER — Other Ambulatory Visit: Payer: Self-pay | Admitting: Family Medicine

## 2016-06-08 ENCOUNTER — Other Ambulatory Visit: Payer: Self-pay | Admitting: Nurse Practitioner

## 2016-06-08 DIAGNOSIS — M47816 Spondylosis without myelopathy or radiculopathy, lumbar region: Secondary | ICD-10-CM

## 2016-06-09 ENCOUNTER — Inpatient Hospital Stay: Admission: RE | Admit: 2016-06-09 | Payer: Medicare Other | Source: Ambulatory Visit

## 2016-06-10 NOTE — Telephone Encounter (Signed)
Patient last seen in office on 05-28-16 with Tammy. Rx last filled on 04-29-16 for #60. Please advise and route to [facility]

## 2016-06-10 NOTE — Telephone Encounter (Signed)
Refill called to [facility] 

## 2016-06-15 ENCOUNTER — Other Ambulatory Visit: Payer: Medicare Other

## 2016-06-15 ENCOUNTER — Encounter: Payer: Self-pay | Admitting: Internal Medicine

## 2016-06-23 DIAGNOSIS — M4316 Spondylolisthesis, lumbar region: Secondary | ICD-10-CM | POA: Diagnosis not present

## 2016-06-23 DIAGNOSIS — M5126 Other intervertebral disc displacement, lumbar region: Secondary | ICD-10-CM | POA: Diagnosis not present

## 2016-06-23 DIAGNOSIS — G629 Polyneuropathy, unspecified: Secondary | ICD-10-CM | POA: Diagnosis not present

## 2016-06-23 DIAGNOSIS — M47816 Spondylosis without myelopathy or radiculopathy, lumbar region: Secondary | ICD-10-CM | POA: Diagnosis not present

## 2016-06-24 ENCOUNTER — Ambulatory Visit (INDEPENDENT_AMBULATORY_CARE_PROVIDER_SITE_OTHER): Payer: Medicare Other | Admitting: *Deleted

## 2016-06-24 DIAGNOSIS — Z4509 Encounter for adjustment and management of other cardiac device: Secondary | ICD-10-CM

## 2016-06-25 ENCOUNTER — Encounter: Payer: Self-pay | Admitting: Pharmacist

## 2016-06-25 ENCOUNTER — Ambulatory Visit (INDEPENDENT_AMBULATORY_CARE_PROVIDER_SITE_OTHER): Payer: Medicare Other | Admitting: Pharmacist

## 2016-06-25 VITALS — BP 138/84 | HR 68 | Ht 74.0 in | Wt 286.0 lb

## 2016-06-25 DIAGNOSIS — Z794 Long term (current) use of insulin: Secondary | ICD-10-CM | POA: Diagnosis not present

## 2016-06-25 DIAGNOSIS — Z79899 Other long term (current) drug therapy: Secondary | ICD-10-CM | POA: Diagnosis not present

## 2016-06-25 DIAGNOSIS — E785 Hyperlipidemia, unspecified: Secondary | ICD-10-CM

## 2016-06-25 DIAGNOSIS — E1142 Type 2 diabetes mellitus with diabetic polyneuropathy: Secondary | ICD-10-CM

## 2016-06-25 DIAGNOSIS — E1169 Type 2 diabetes mellitus with other specified complication: Secondary | ICD-10-CM

## 2016-06-25 MED ORDER — DULAGLUTIDE 1.5 MG/0.5ML ~~LOC~~ SOAJ: 1.5000 mg | SUBCUTANEOUS | 2 refills | Status: DC

## 2016-06-25 NOTE — Progress Notes (Signed)
Patient ID: [name], male   DOB: October 25, 1950, 66 y.o.   MRN: [id number]   Subjective:    MICHAELL IGLEHEART is a 66 y.o. male who presents for medication management and  follow-up of Type 2 diabetes mellitus.    Mr. Oldenburg was last seen 1 month ago following hospitalization from 05/21/16 to 05/25/16 for syncopal episode.  During hospitalization patient was evaluated by cardiologist.  A loop recorder was placed.  Mr. Obrochta reports that he has not followed up with cardiologist yet because last appt was cancelled due to weather. He reports that he stopped clopidogrel about 2 weeks ago because of bleeding gums - was visiting his parents and tasted blood in mouth and noted that gums were bleeding.  He restarted ASA 81mg .  Has not had bleeding gums since.   Diabetes - patient started Trulicity 0.75mg  SQ weekly about 5 weeks ago and has tolerated well .  Other meds for diabetes include - Relion Mix 70/30 100 units bid and Januvia 100mg  qd.  He also injects short acting insulin as needed for elevated BG based on equation BG - 120 /15 but has not needed in last 3 weeks. BG ranges from 150 to 180  Known diabetic complications: peripheral neuropathy and cardiovascular disease Cardiovascular risk factors: advanced age (older than 48 for men, 29 for women), diabetes mellitus, dyslipidemia, hypertension, male gender, obesity (BMI >= 30 kg/m2), sedentary lifestyle and smoking/ tobacco exposure   Eye exam current (within one year): yes Current diet: in general, an "unhealthy" diet.  Has decrease eating candy since Christmas but has not been trying to count CHO.  Patient was successful with decreasing BG and weight with low CHO diet given by chiropractor about 1 year ago but he states he could not maintain diet because there was not enough variety. Current exercise: none  Current monitoring regimen:  Checks 2-3 times daily.  Ranges from 150's to 180's-  Patient reported   Is He on ACE inhibitor or angiotensin II  receptor blocker?  Yes telmisartan 40mg  qd - was suppose to hold until followed up with cardiologist but he restarted on own and stopped amlodipine  The following portions of the patient's history were reviewed and updated as appropriate: allergies, current medications, past family history, past medical history, past social history, past surgical history and problem list.  Objective:    BP 138/84   Pulse 68   Ht 6\' 2"  (1.88 m)   Wt 286 lb (129.7 kg)   BMI 36.72 kg/m   A1c = 10.0% (05/21/2016) A1c = 7.7% (02/19/2016) A1c = 6.5% (09/2015)  Lab Review Glucose (mg/dL)  Date Value  05/26/2016 275 (H)   Glucose, Bld (mg/dL)  Date Value  05/25/2016 254 (H)  05/23/2016 282 (H)  05/22/2016 221 (H)   CO2 (mmol/L)  Date Value  05/26/2016 21  05/25/2016 23  05/23/2016 23   BUN (mg/dL)  Date Value  05/26/2016 34 (H)  05/25/2016 30 (H)  05/23/2016 27 (H)  05/22/2016 37 (H)  02/11/2016 19  09/17/2015 25   Creatinine, Ser (mg/dL)  Date Value  05/26/2016 1.27  05/25/2016 1.36 (H)  05/23/2016 1.19     Assessment:    Diabetes Mellitus type II, under variable control  HTN - at goal today on second check Hyperlipidemia - statin intolerant - patient refuses to retry statin but restarted Red Yeast Rice on own  Plan:    1.  Rx changes:   Continue Relion Mix 70/30 - injection 100  unit BID  Stop Januvia 100mg  - similar MOA as GLP1 / Trulicity  Increase Trulicity to 1.5mg  SQ weekly 2.  Continue to check BG 3 times daily 3.  Education: Reviewed 'ABCs' of diabetes management (respective goals in parentheses):  A1C (<7), blood pressure (<130/80), and cholesterol (LDL <100). 4.  Reviewed CHO counting diet. Specifically discussed different ways to prepare proteins and vegetables to give patient more variety.   5.  Patient to call cardiologist today to follow up. 6.   Follow up:  6 weeks with PCP  Cherre Robins, PharmD, CPP, CDE

## 2016-06-25 NOTE — Patient Instructions (Signed)
Increase Trulicity to 7.6PP - inject one syringe once weekly  Stop Januvia  Diabetes and Standards of Medical Care   Diabetes is complicated. You may find that your diabetes team includes a dietitian, nurse, diabetes educator, eye doctor, and more. To help everyone know what is going on and to help you get the care you deserve, the following schedule of care was developed to help keep you on track. Below are the tests, exams, vaccines, medicines, education, and plans you will need.  Blood Glucose Goals Prior to meals = 80 - 130 Within 2 hours of the start of a meal = less than 180  HbA1c test (goal is less than 7.0% - your last value was 10%) This test shows how well you have controlled your glucose over the past 2 to 3 months. It is used to see if your diabetes management plan needs to be adjusted.   It is performed at least 2 times a year if you are meeting treatment goals.  It is performed 4 times a year if therapy has changed or if you are not meeting treatment goals.  Blood pressure test  This test is performed at every routine medical visit. The goal is less than 140/90 mmHg for most people, but 130/80 mmHg in some cases. Ask your health care provider about your goal.  Dental exam  Follow up with the dentist regularly.  Eye exam  If you are diagnosed with type 1 diabetes as a child, get an exam upon reaching the age of 38 years or older and have had diabetes for 3 to 5 years. Yearly eye exams are recommended after that initial eye exam.  If you are diagnosed with type 1 diabetes as an adult, get an exam within 5 years of diagnosis and then yearly.  If you are diagnosed with type 2 diabetes, get an exam as soon as possible after the diagnosis and then yearly.  Foot care exam  Visual foot exams are performed at every routine medical visit. The exams check for cuts, injuries, or other problems with the feet.  A comprehensive foot exam should be done yearly. This includes  visual inspection as well as assessing foot pulses and testing for loss of sensation.  Check your feet nightly for cuts, injuries, or other problems with your feet. Tell your health care provider if anything is not healing.  Kidney function test (urine microalbumin)  This test is performed once a year.  Type 1 diabetes: The first test is performed 5 years after diagnosis.  Type 2 diabetes: The first test is performed at the time of diagnosis.  A serum creatinine and estimated glomerular filtration rate (eGFR) test is done once a year to assess the level of chronic kidney disease (CKD), if present.  Lipid profile (cholesterol, HDL, LDL, triglycerides)  Performed every 5 years for most people.  The goal for LDL is less than 100 mg/dL. If you are at high risk, the goal is less than 70 mg/dL.  The goal for HDL is 40 mg/dL to 50 mg/dL for men and 50 mg/dL to 60 mg/dL for women. An HDL cholesterol of 60 mg/dL or higher gives some protection against heart disease.  The goal for triglycerides is less than 150 mg/dL.  Influenza vaccine, pneumococcal vaccine, and hepatitis B vaccine  The influenza vaccine is recommended yearly.  The pneumococcal vaccine is generally given once in a lifetime. However, there are some instances when another vaccination is recommended. Check with your health care  provider.  The hepatitis B vaccine is also recommended for adults with diabetes.

## 2016-06-25 NOTE — Progress Notes (Signed)
Carelink Summary Report / Loop Recorder 

## 2016-06-29 DIAGNOSIS — M5136 Other intervertebral disc degeneration, lumbar region: Secondary | ICD-10-CM | POA: Diagnosis not present

## 2016-06-29 DIAGNOSIS — Z87891 Personal history of nicotine dependence: Secondary | ICD-10-CM | POA: Diagnosis not present

## 2016-07-01 ENCOUNTER — Ambulatory Visit (INDEPENDENT_AMBULATORY_CARE_PROVIDER_SITE_OTHER): Payer: Medicare Other | Admitting: *Deleted

## 2016-07-01 DIAGNOSIS — Z4509 Encounter for adjustment and management of other cardiac device: Secondary | ICD-10-CM

## 2016-07-01 LAB — CUP PACEART INCLINIC DEVICE CHECK
Date Time Interrogation Session: 20180214170938
Implantable Pulse Generator Implant Date: 20180108

## 2016-07-01 NOTE — Progress Notes (Signed)
Loop wound check in clinic. Steri-strips previously removed. Battery status: good. R-waves 0.31mV. 1 symptom episode- sinus with PVCs, patient cannot recall symptoms. No other episodes. Monthly summary reports, ROV with GT/RDS 08/27/16.

## 2016-07-09 ENCOUNTER — Encounter: Payer: Self-pay | Admitting: Neurology

## 2016-07-09 ENCOUNTER — Ambulatory Visit (INDEPENDENT_AMBULATORY_CARE_PROVIDER_SITE_OTHER): Payer: Medicare Other | Admitting: Neurology

## 2016-07-09 VITALS — BP 151/75 | HR 72 | Ht 74.0 in | Wt 299.4 lb

## 2016-07-09 DIAGNOSIS — I672 Cerebral atherosclerosis: Secondary | ICD-10-CM | POA: Diagnosis not present

## 2016-07-09 DIAGNOSIS — N189 Chronic kidney disease, unspecified: Secondary | ICD-10-CM

## 2016-07-09 NOTE — Progress Notes (Signed)
[id number]    Provider:  Dr Jaynee Eagles Referring Provider: [name], FNP Primary Care Physician:  [name], FNP  CC:  Occlusion of vertebral artery  HPI:  [name] [name] is a 66 y.o. male here as a referral from Dr. Lenna Gilford for atherosclerotic disease. PMHx HTN, diabetes, diabetic neuropathy, HLD, depression, obesity. Patient is here after admission to the [facility] in early January of this year for visual field changes, blurry vision, Sharp pain behind his right eye and right-sided headache.He was admitted to telemetry for further evaluation. Carotid ultrasound did not show any significant ICA stenosis. MRI of the brain without contrast showed chronic lacunar infarcts but nothing new. Imaging of the vessels showed atheromatous disease. EEG was normal. He did have a complete stroke workup and he is here for follow-up today. He was changed from aspirin to Plavix inpatient.  He stopped taking the Plavix. He has gone back to taking a baby aspirin. He is trying to manage his diabetes. He is on Trulicity now and working on Mudlogger with pcp. He is taking his glucose multiple times a day and adjusting insulin sliding scale. He tried a statin again and he can't tolerate it and stopped it. He is trying to eat better. He sees Administrator, arts.  He had some right sided sharp neck pain that radiated down the right arm. Since he left the hospital he can hear his heart beat in his neck radiating to the right ear where the sharp pain was (points to the carotid on the right)  Reviewed notes, labs and imaging from outside physicians, which showed:  HgbA1c 10 05/2016 Triglycerides 647 Cholesterol 239 Cannot calculat LDL  MRI brain:  IMPRESSION: 1. No acute intracranial abnormality. 2. Chronic left basal ganglia lacunar infarct.  MRA head: 1. No visible flow in the left V4 segment. See MRA neck that is reported separately. 2. High-grade atheromatous narrowings  at the left ICA anterior genu, bilateral M2 branches, and bilateral P2 branches.  MRA neck : 1. Thready flow within the left vertebral artery which is new compared to 2015 imaging. Suspect a high-grade proximal left subclavian stenosis favoring steal phenomenon. Dissection is the main differential consideration. CTA is recommended to further evaluate. 2. Cervical carotid atherosclerosis with ~ 40% proximal left ICA Stenosis.  CT Angio of the neck: 1. Left V3 segment occlusion. Distal left V4 reconstitution by the basilar with patent left PICA. 2. Diffuse narrowing and luminal irregularity of the left vertebral artery. Correlate for neck pain as dissection or atherosclerosis and underfilling could give this appearance. 3. Mild atherosclerotic narrowing of the left subclavian ostium. No subclavian flow limiting stenosis. 4. Cervical carotid atherosclerosis with 30 to 40 % ICA narrowing on the left.  Review of Systems: Patient complains of symptoms per HPI as well as the following symptoms: no CP, no SOB. Pertinent negatives per HPI. All others negative.   Social History   Social History  . Marital status: Married    Spouse name: N/A  . Number of children: 2  . Years of education: N/A   Occupational History  . retired    Social History Main Topics  . Smoking status: Current Some Day Smoker    Packs/day: 0.25    Years: 51.00    Types: Cigarettes  . Smokeless tobacco: Never Used     Comment: smokes  a pack a week  . Alcohol use No  . Drug use: No  . Sexual activity: Yes  Other Topics Concern  . Not on file   Social History Narrative   Lives with his wife.  Retired.  Unable to afford medicines.      Family History  Problem Relation Age of Onset  . Diabetes Father   . Valvular heart disease Father   . Arthritis Father   . Heart disease Father   . Stroke Father   . Alzheimer's disease Mother   . Hyperlipidemia Mother   . Hypertension Mother   . Cancer Mother      lung  . Arthritis Mother   . Arthritis Sister     rheumatoid  . Diabetes Sister   . Hypertension Sister   . Hyperlipidemia Sister   . Dementia Maternal Aunt   . Dementia Maternal Uncle   . Heart disease Maternal Uncle   . Cancer Paternal Uncle     stomach  . Colon cancer Neg Hx   . Liver disease Neg Hx     Past Medical History:  Diagnosis Date  . AODM   . Arthritis   . CAD (coronary artery disease)    2010  LAD 50%, RCA 100%.  DES to RCA.  EF 55%  . Cellulitis and abscess rt groin   . CHEST PAIN-UNSPECIFIED   . Diabetes mellitus   . Disorders of iron metabolism   . DISORDERS OF IRON METABOLISM   . GERD (gastroesophageal reflux disease)   . Hyperlipidemia   . Hypertension   . Low serum testosterone level   . Medically noncompliant   . MURMUR     Past Surgical History:  Procedure Laterality Date  . BACK SURGERY  2015   ACDF by Dr. [name]  . COLONOSCOPY N/A 10/01/2014   Procedure: COLONOSCOPY;  Surgeon: Daneil Dolin, MD;  [facility];  Service: Endoscopy;  Laterality: N/A;  815  . CORONARY STENT PLACEMENT  2000   By Dr. Olevia Perches  . EP IMPLANTABLE DEVICE N/A 05/25/2016   Procedure: Loop Recorder Insertion;  Surgeon: [name], MD;  Location: [place] [facility];  Service: Cardiovascular;  Laterality: N/A;  . ESOPHAGOGASTRODUODENOSCOPY     esophagus stretched [facility] at [facility]  . ESOPHAGOGASTRODUODENOSCOPY N/A 10/01/2014   Procedure: ESOPHAGOGASTRODUODENOSCOPY (EGD);  Surgeon: Daneil Dolin, MD;  [facility];  Service: Endoscopy;  Laterality: N/A;  . HERNIA REPAIR  [id number]   umbilical  . LESION REMOVAL     Lip and hand   . NECK SURGERY      Current Outpatient Prescriptions  Medication Sig Dispense Refill  . ALPRAZolam (XANAX) 0.5 MG tablet TAKE ONE TABLET BY MOUTH TWICE DAILY AS NEEDED 60 tablet 3  . amLODipine (NORVASC) 10 MG tablet Take 1 tablet (10 mg total) by mouth daily. 30 tablet 0  . aspirin 81 MG tablet Take 81 mg by mouth  daily.    . Dulaglutide (TRULICITY) 1.5 0000000 SOPN Inject 1.5 mg into the skin once a week. 2 mL 2  . furosemide (LASIX) 20 MG tablet TAKE ONE TABLET BY MOUTH ONCE DAILY AS NEEDED (Patient taking differently: Take 20 mg by mouth daily. TAKE ONE TABLET BY MOUTH ONCE DAILY AS NEEDED) 90 tablet 4  . gabapentin (NEURONTIN) 300 MG capsule Take 1 capsule (300 mg total) by mouth 3 (three) times daily. 270 capsule 4  . HYDROcodone-acetaminophen (NORCO) 10-325 MG tablet Take 1-2 tablets by mouth every 8 (eight) hours as needed for moderate pain.     Marland Kitchen insulin NPH-regular Human (NOVOLIN 70/30 RELION) (  70-30) 100 UNIT/ML injection Inject 100 Units into the skin 2 (two) times daily with a meal. (Patient taking differently: Inject 80 Units into the skin 2 (two) times daily with a meal. ) 50 mL 0  . Insulin Pen Needle (PEN NEEDLES) 31G X 6 MM MISC Use to inject insulin two to three times a day 100 each 3  . isosorbide mononitrate (IMDUR) 30 MG 24 hr tablet TAKE ONE TABLET BY MOUTH ONCE DAILY 30 tablet 3  . JANUVIA 100 MG tablet     . Lactobacillus (PROBIOTIC ACIDOPHILUS PO) Take 1 capsule by mouth daily.    Marland Kitchen LIVALO 2 MG TABS     . meloxicam (MOBIC) 15 MG tablet     . metoprolol tartrate (LOPRESSOR) 25 MG tablet Take 0.5 tablets (12.5 mg total) by mouth 2 (two) times daily. 90 tablet 4  . mometasone (NASONEX) 50 MCG/ACT nasal spray Place 2 sprays into the nose daily. (Patient taking differently: Place 2 sprays into the nose daily as needed (congestion). ) 17 g 12  . omeprazole (PRILOSEC) 40 MG capsule TAKE ONE CAPSULE BY MOUTH TWICE DAILY HALF AN HOUR BEFORE BREAKFAST AND SUPPER 180 capsule 4  . Red Yeast Rice 600 MG TABS Take 1 tablet by mouth daily.    . sertraline (ZOLOFT) 50 MG tablet Take 1 tablet (50 mg total) by mouth daily. 30 tablet 0  . telmisartan (MICARDIS) 40 MG tablet TAKE ONE TABLET BY MOUTH ONCE DAILY (REPLACING  LISINOPRIL) 90 tablet 4  . TRULICITY A999333 0000000 SOPN      No current  facility-administered medications for this visit.     Allergies as of 07/09/2016 - Review Complete 07/09/2016  Allergen Reaction Noted  . Shellfish allergy Anaphylaxis 09/06/2014  . Ace inhibitors Other (See Comments) and Cough 08/09/2013  . Invokana [canagliflozin] Other (See Comments) 09/04/2013  . Metformin and related Itching 07/12/2013  . Pravastatin sodium  11/14/2014  . Fenofibrate  11/14/2015  . Horse-derived products  07/25/2008  . Iodine  09/07/2014  . Lexapro [escitalopram oxalate]  01/13/2016  . Lisinopril Cough 10/04/2014  . Livalo [pitavastatin] Other (See Comments) 06/25/2016  . Crestor [rosuvastatin] Other (See Comments) 06/05/2013  . Lipitor [atorvastatin] Other (See Comments) 06/05/2013    Vitals: BP (!) 151/75 (BP Location: Right Arm, Patient Position: Sitting, Cuff Size: Normal)   Pulse 72   Ht 6\' 2"  (1.88 m)   Wt 299 lb 6.4 oz (135.8 kg)   BMI 38.44 kg/m  Last Weight:  Wt Readings from Last 1 Encounters:  07/09/16 299 lb 6.4 oz (135.8 kg)   Last Height:   Ht Readings from Last 1 Encounters:  07/09/16 6\' 2"  (1.88 m)   Physical exam: Exam: Gen: NAD, conversant, well nourised, obese, well groomed                     CV: RRR, no MRG. No Carotid Bruits. No peripheral edema, warm, nontender Eyes: Conjunctivae clear without exudates or hemorrhage  Neuro: Detailed Neurologic Exam  Speech:    Speech is normal; fluent and spontaneous with normal comprehension.  Cognition:    The patient is oriented to person, place, and time;     recent and remote memory intact;     language fluent;     normal attention, concentration,     fund of knowledge Cranial Nerves:    The pupils are equal, round, and reactive to light. The fundi are normal and spontaneous venous pulsations are present. Visual fields  are full to finger confrontation. Extraocular movements are intact. Trigeminal sensation is intact and the muscles of mastication are normal. The face is symmetric.  The palate elevates in the midline. Hearing intact. Voice is normal. Shoulder shrug is normal. The tongue has normal motion without fasciculations.   Coordination:    Normal finger to nose and heel to shin. Normal rapid alternating movements.   Gait:    Heel-toe and tandem gait are normal.   Motor Observation:    No asymmetry, no atrophy, and no involuntary movements noted. Tone:    Normal muscle tone.    Posture:    Posture is normal. normal erect    Strength:    Strength is V/V in the upper and lower limbs.      Sensation: intact to LT     Reflex Exam:  DTR's:    Deep tendon reflexes in the upper and lower extremities are normal bilaterally.   Toes:    The toes are downgoing bilaterally.   Clonus:    Clonus is absent.       Assessment/Plan:  [name] [name] is a 66 y.o. male with history of hypertension, hyperlipidemia, ongoing tobacco use, remote stroke, diabetes mellitus, coronary artery disease, history of medical noncompliance, and diabetes mellitus presenting with recurrent syncope. Workup shows Cerebrovascular atherosclerotic disease.   CTA angiogram of the neck due to sharp pain radiating to the arm with heartbeat sound near his right ear to eval for carotid dissection.  I had a long d/w patient about his stroke risk, risk for recurrent stroke/TIAs, personally independently reviewed imaging studies and stroke evaluation results and answered questions.Continue Aspirin avix for secondary stroke prevention and maintain strict control of hypertension with blood pressure goal below 130/90, diabetes with hemoglobin A1c goal below 6.5% and lipids with LDL cholesterol goal below 70 mg/dL.Patient has h/o statin intolerance hence recommend new PCSK9 inhibitor like Praluent. Recommend he see his cardiologist MD to get prescription. I also advised the patient to eat a healthy diet with plenty of whole grains, cereals, fruits and vegetables, exercise regularly and maintain ideal  body weight .Followup in the future with me in 6 months or call earlier if necessary. Recommend aggressive management of his uncontrolled vascular risk factors (cholesterol, diabetes, HTN).  With diffuse cerebrovascular disease would avoid hypotension. Discussed smoking cessation and obesity. Highly encouraged him to manage his vascular risk factors which are extensive.  Sarina Ill, MD  [facility] [address] [place], [place] [address]  Phone [phone] Fax [phone]  A total of 45 minutes was spent face-to-face with this patient. Over half this time was spent on counseling patient on the Cerebrovascular atherosclerotic disease diagnosis and different diagnostic and therapeutic options available.

## 2016-07-09 NOTE — Patient Instructions (Signed)
Remember to drink plenty of fluid, eat healthy meals and do not skip any meals. Try to eat protein with a every meal and eat a healthy snack such as fruit or nuts in between meals. Try to keep a regular sleep-wake schedule and try to exercise daily, particularly in the form of walking, 20-30 minutes a day, if you can.   As far as your medications are concerned, I would like to suggest: [facility]  As far as diagnostic testing: CT Angiogram of the neck to check carotids, labs  I would like to see you back in 6 months, sooner if we need to. Please call us with any interim questions, concerns, problems, updates or refill requests.   Our phone number is [phone]. We also have an after hours call service for urgent matters and there is a physician on-call for urgent questions. For any emergencies you know to call 911 or go to the nearest emergency room  Continue Asoirin for secondary stroke prevention and maintain strict control of hypertension with blood pressure goal 130/90, diabetes with hemoglobin A1c goal below 6.5% and lipids with LDL cholesterol goal below 70 mg/dL.Patient has h/o statin intolerance hence may recommend new PCSK9 inhibitor like Praluent and lipid clinic. I also advise to eat a healthy diet with plenty of whole grains, cereals, fruits and vegetables, exercise regularly and maintain ideal body weight .Followup in the future with me in 6 months or call earlier if necessary.

## 2016-07-10 ENCOUNTER — Telehealth: Payer: Self-pay

## 2016-07-10 ENCOUNTER — Telehealth: Payer: Self-pay | Admitting: *Deleted

## 2016-07-10 LAB — BASIC METABOLIC PANEL
BUN/Creatinine Ratio: 20 (ref 10–24)
BUN: 23 mg/dL (ref 8–27)
CO2: 24 mmol/L (ref 18–29)
Calcium: 10.1 mg/dL (ref 8.6–10.2)
Chloride: 97 mmol/L (ref 96–106)
Creatinine, Ser: 1.13 mg/dL (ref 0.76–1.27)
GFR calc Af Amer: 78 (ref >59)
GFR calc non Af Amer: 68 (ref >59)
Glucose: 188 mg/dL — ABNORMAL HIGH (ref 65–99)
Potassium: 5.1 mmol/L (ref 3.5–5.2)
Sodium: 136 mmol/L (ref 134–144)

## 2016-07-10 NOTE — Telephone Encounter (Signed)
LMOM requesting call back to [facility]. Symptom episode ECGs show SR w/PVCs.  Will determine patient's symptoms during episodes.

## 2016-07-10 NOTE — Telephone Encounter (Signed)
-----   Message from Melvenia Beam, MD sent at 07/10/2016  9:43 AM EST ----- BMP with elevated glucose otherwise normal

## 2016-07-10 NOTE — Telephone Encounter (Signed)
I called pt, spoke to pt's wife, [name], per DPR. I advised her that pt's labs showed a normal BMP with just an elevated glucose. Pt's wife verbalized understanding and had no questions and will pass this information on to the pt.

## 2016-07-13 NOTE — Telephone Encounter (Signed)
Spoke with patient.  He reports that his wife used his symptom activator most recently (on 07/08/16) as he was having intermittent pains in his R neck.  The pains resolved within minutes and haven't returned.  Patient does not recall any other associated symptoms.  Patient had 11hr 68min duration episode of atrial fibrillation on 07/10/16.  ECG reviewed by Dr. Lovena Le.  Per Dr. Lovena Le, patient should be seen in office to discuss [place].  Patient requests appointment in [place], but does not feel that next available opening on 07/30/16 is soon enough (though date was okayed by Dr. Lovena Le).  Patient is agreeable to appointment at [facility] office on Monday, 07/20/16 at 3:45pm.  Patient is appreciative of assistance and denies additional questions or concerns at this time.

## 2016-07-14 ENCOUNTER — Encounter: Payer: Self-pay | Admitting: *Deleted

## 2016-07-14 ENCOUNTER — Ambulatory Visit (INDEPENDENT_AMBULATORY_CARE_PROVIDER_SITE_OTHER): Payer: Medicare Other | Admitting: Internal Medicine

## 2016-07-14 VITALS — BP 114/62 | HR 69 | Ht 74.0 in | Wt 285.0 lb

## 2016-07-14 DIAGNOSIS — I10 Essential (primary) hypertension: Secondary | ICD-10-CM | POA: Diagnosis not present

## 2016-07-14 DIAGNOSIS — I480 Paroxysmal atrial fibrillation: Secondary | ICD-10-CM

## 2016-07-14 LAB — CUP PACEART INCLINIC DEVICE CHECK
Date Time Interrogation Session: 20180227151038
Implantable Pulse Generator Implant Date: 20180108

## 2016-07-14 MED ORDER — APIXABAN 5 MG PO TABS: 5.0000 mg | ORAL_TABLET | Freq: Two times a day (BID) | ORAL | 6 refills | Status: DC

## 2016-07-14 NOTE — Progress Notes (Signed)
HPI [name] returns today to discuss his newly diagnosed atrial fib. He is a 66 yo man with unexplained syncope who underwent insertion of an ILR several months ago. He has not had syncope since his device was placed but device interrogation demonstrated atrial fib with a controlled VR. He does not feel palpitations although he notes that he has felt some fatigue.  Allergies  Allergen Reactions  . Shellfish Allergy Anaphylaxis    Tongue swelling, hives  . Ace Inhibitors Other (See Comments) and Cough    CKD, renal failure   . Invokana [Canagliflozin] Other (See Comments)    Syncope / dehydration  . Metformin And Related Itching  . Pravastatin Sodium     myalgias  . Fenofibrate     Body aches  . Horse-Derived Products     REACTION: Rash  . Iodine     ????  . Lexapro [Escitalopram Oxalate]     Buzzing in ears,headache, felt like a zombie  . Lisinopril Cough  . Livalo [Pitavastatin] Other (See Comments)    Myalgias   . Crestor [Rosuvastatin] Other (See Comments)    Myalgias   . Lipitor [Atorvastatin] Other (See Comments)    myalgias     Current Outpatient Prescriptions  Medication Sig Dispense Refill  . ALPRAZolam (XANAX) 0.5 MG tablet TAKE ONE TABLET BY MOUTH TWICE DAILY AS NEEDED 60 tablet 3  . amLODipine (NORVASC) 10 MG tablet Take 1 tablet (10 mg total) by mouth daily. 30 tablet 0  . furosemide (LASIX) 20 MG tablet TAKE ONE TABLET BY MOUTH ONCE DAILY AS NEEDED (Patient taking differently: Take 20 mg by mouth daily. TAKE ONE TABLET BY MOUTH ONCE DAILY AS NEEDED) 90 tablet 4  . gabapentin (NEURONTIN) 300 MG capsule Take 1 capsule (300 mg total) by mouth 3 (three) times daily. 270 capsule 4  . HYDROcodone-acetaminophen (NORCO) 10-325 MG tablet Take 1-2 tablets by mouth every 8 (eight) hours as needed for moderate pain.     Marland Kitchen insulin NPH-regular Human (NOVOLIN 70/30 RELION) (70-30) 100 UNIT/ML injection Inject 100 Units into the skin 2 (two) times daily with a meal.  (Patient taking differently: Inject 80 Units into the skin 2 (two) times daily with a meal. ) 50 mL 0  . Insulin Pen Needle (PEN NEEDLES) 31G X 6 MM MISC Use to inject insulin two to three times a day 100 each 3  . isosorbide mononitrate (IMDUR) 30 MG 24 hr tablet TAKE ONE TABLET BY MOUTH ONCE DAILY 30 tablet 3  . Lactobacillus (PROBIOTIC ACIDOPHILUS PO) Take 1 capsule by mouth daily.    Marland Kitchen LIVALO 2 MG TABS     . meloxicam (MOBIC) 15 MG tablet     . metoprolol tartrate (LOPRESSOR) 25 MG tablet Take 0.5 tablets (12.5 mg total) by mouth 2 (two) times daily. 90 tablet 4  . mometasone (NASONEX) 50 MCG/ACT nasal spray Place 2 sprays into the nose daily. (Patient taking differently: Place 2 sprays into the nose daily as needed (congestion). ) 17 g 12  . omeprazole (PRILOSEC) 40 MG capsule TAKE ONE CAPSULE BY MOUTH TWICE DAILY HALF AN HOUR BEFORE BREAKFAST AND SUPPER 180 capsule 4  . Red Yeast Rice 600 MG TABS Take 1 tablet by mouth daily.    . sertraline (ZOLOFT) 50 MG tablet Take 1 tablet (50 mg total) by mouth daily. 30 tablet 0  . telmisartan (MICARDIS) 40 MG tablet TAKE ONE TABLET BY MOUTH ONCE DAILY (REPLACING  LISINOPRIL) 90 tablet  4  . TRULICITY A999333 0000000 SOPN     . apixaban (ELIQUIS) 5 MG TABS tablet Take 1 tablet (5 mg total) by mouth 2 (two) times daily. 60 tablet 6  . Dulaglutide (TRULICITY) 1.5 0000000 SOPN Inject 1.5 mg into the skin once a week. (Patient not taking: Reported on 07/14/2016) 2 mL 2   No current facility-administered medications for this visit.      Past Medical History:  Diagnosis Date  . AODM   . Arthritis   . CAD (coronary artery disease)    2010  LAD 50%, RCA 100%.  DES to RCA.  EF 55%  . Cellulitis and abscess rt groin   . CHEST PAIN-UNSPECIFIED   . Diabetes mellitus   . Disorders of iron metabolism   . DISORDERS OF IRON METABOLISM   . GERD (gastroesophageal reflux disease)   . Hyperlipidemia   . Hypertension   . Low serum testosterone level   .  Medically noncompliant   . MURMUR     ROS:   All systems reviewed and negative except as noted in the HPI.   Past Surgical History:  Procedure Laterality Date  . BACK SURGERY  2015   ACDF by Dr. [name]  . COLONOSCOPY N/A 10/01/2014   Procedure: COLONOSCOPY;  Surgeon: Daneil Dolin, MD;  [facility];  Service: Endoscopy;  Laterality: N/A;  815  . CORONARY STENT PLACEMENT  2000   By Dr. Olevia Perches  . EP IMPLANTABLE DEVICE N/A 05/25/2016   Procedure: Loop Recorder Insertion;  Surgeon: [name], MD;  Location: [place] [facility];  Service: Cardiovascular;  Laterality: N/A;  . ESOPHAGOGASTRODUODENOSCOPY     esophagus stretched [facility] at [facility]  . ESOPHAGOGASTRODUODENOSCOPY N/A 10/01/2014   Procedure: ESOPHAGOGASTRODUODENOSCOPY (EGD);  Surgeon: Daneil Dolin, MD;  [facility];  Service: Endoscopy;  Laterality: N/A;  . HERNIA REPAIR  [id number]   umbilical  . LESION REMOVAL     Lip and hand   . NECK SURGERY       Family History  Problem Relation Age of Onset  . Diabetes Father   . Valvular heart disease Father   . Arthritis Father   . Heart disease Father   . Stroke Father   . Alzheimer's disease Mother   . Hyperlipidemia Mother   . Hypertension Mother   . Arthritis Mother   . Lung cancer Mother   . Arthritis/Rheumatoid Sister   . Diabetes Sister   . Hypertension Sister   . Hyperlipidemia Sister   . Dementia Maternal Aunt   . Dementia Maternal Uncle   . Heart disease Maternal Uncle   . Stomach cancer Paternal Uncle   . Colon cancer Neg Hx   . Liver disease Neg Hx      Social History   Social History  . Marital status: Married    Spouse name: N/A  . Number of children: 2  . Years of education: N/A   Occupational History  . retired    Social History Main Topics  . Smoking status: Current Some Day Smoker    Packs/day: 0.25    Years: 51.00    Types: Cigarettes  . Smokeless tobacco: Never Used     Comment: smokes  a pack a week  .  Alcohol use No  . Drug use: No  . Sexual activity: Yes   Other Topics Concern  . Not on file   Social History Narrative   Lives with his wife.  Retired.  Unable to afford medicines.       BP 114/62   Pulse 69   Ht 6\' 2"  (1.88 m)   Wt 285 lb (129.3 kg)   SpO2 97%   BMI 36.59 kg/m   Physical Exam:  Well appearing 66 yo man, NAD HEENT: Unremarkable Neck:  6 cm JVD, no thyromegally Lymphatics:  No adenopathy Back:  No CVA tenderness Lungs:  Clear with no wheezes HEART:  Regular rate rhythm, no murmurs, no rubs, no clicks Abd:  soft, positive bowel sounds, no organomegally, no rebound, no guarding Ext:  2 plus pulses, no edema, no cyanosis, no clubbing Skin:  No rashes no nodules Neuro:  CN II through XII intact, motor grossly intact  EKG - NSR  DEVICE  Normal device function.  See PaceArt for details.   Assess/Plan: 1. New atrial fib - his ILR clearly demonstrates PAF. He is minimally symptomatic if any. I have recommended he start Eliquis.  2. HTN - his blood pressure is well controlled. Will continue his current meds. He is encouraged to maintain a low sodium diet.  Mikle Bosworth.D.

## 2016-07-14 NOTE — Patient Instructions (Addendum)
Medication Instructions:    STOP TAKING ASPIRIN 81 MG ONCE A DAY    START TAKING ELIQUIS 5 MG TWICE A DAY   If you need a refill on your cardiac medications before your next appointment, please call your pharmacy.  Labwork: NONE ORDERED  TODAY    Testing/Procedures: NONE ORDERED  TODAY    Follow-Up: Your physician wants you to follow-up in:  IN [place] will receive a reminder letter in the mail two months in advance. If you don't receive a letter, please call our office to schedule the follow-up appointment.     Any Other Special Instructions Will Be Listed Below (If Applicable).

## 2016-07-16 ENCOUNTER — Other Ambulatory Visit: Payer: Self-pay | Admitting: Family

## 2016-07-16 ENCOUNTER — Telehealth: Payer: Self-pay | Admitting: Family

## 2016-07-16 MED ORDER — INSULIN NPH ISOPHANE & REGULAR (70-30) 100 UNIT/ML ~~LOC~~ SUSP: 80.0000 [IU] | Freq: Two times a day (BID) | SUBCUTANEOUS | 0 refills | Status: DC

## 2016-07-16 MED ORDER — INSULIN NPH ISOPHANE & REGULAR (70-30) 100 UNIT/ML ~~LOC~~ SUSP: 80.0000 [IU] | Freq: Two times a day (BID) | SUBCUTANEOUS | 3 refills | Status: DC

## 2016-07-16 NOTE — Telephone Encounter (Signed)
What is the name of the medication? novolin 70/30. Needs rx with refills. He is out. Needs asap.  Have you contacted your pharmacy to request a refill? yes  Which pharmacy would you like this sent to? [facility] in [place].    Patient notified that their request is being sent to the clinical staff for review and that they should receive a call once it is complete. If they do not receive a call within 24 hours they can check with their pharmacy or our office.

## 2016-07-16 NOTE — Telephone Encounter (Signed)
rx sent over to the pharmacy and pt is aware.

## 2016-07-19 LAB — CUP PACEART REMOTE DEVICE CHECK
Date Time Interrogation Session: 20180207143602
Implantable Pulse Generator Implant Date: 20180108

## 2016-07-19 NOTE — Progress Notes (Signed)
Carelink summary report received. Battery status OK. Normal device function. No new tachy episodes, brady, or pause episodes. No new AF episodes. 1 symptom- ECG appears SR w/ PVCs. Monthly summary reports and ROV/PRN

## 2016-07-20 ENCOUNTER — Encounter: Payer: Medicare Other | Admitting: Internal Medicine

## 2016-07-24 ENCOUNTER — Ambulatory Visit (INDEPENDENT_AMBULATORY_CARE_PROVIDER_SITE_OTHER): Payer: Medicare Other | Admitting: *Deleted

## 2016-07-24 DIAGNOSIS — I480 Paroxysmal atrial fibrillation: Secondary | ICD-10-CM

## 2016-07-24 LAB — CUP PACEART REMOTE DEVICE CHECK
Date Time Interrogation Session: 20180309150723
Implantable Pulse Generator Implant Date: 20180108

## 2016-07-24 NOTE — Progress Notes (Signed)
Carelink Summary Report / Loop Recorder 

## 2016-07-29 ENCOUNTER — Inpatient Hospital Stay (HOSPITAL_COMMUNITY)
Admission: AD | Admit: 2016-07-29 | Discharge: 2016-07-31 | DRG: 247 | Disposition: A | Discharge status: Home / Self Care | Payer: Medicare Other | Source: Other Acute Inpatient Hospital | Attending: Cardiovascular Disease | Admitting: Cardiovascular Disease

## 2016-07-29 ENCOUNTER — Encounter (HOSPITAL_COMMUNITY): Payer: Self-pay | Admitting: *Deleted

## 2016-07-29 DIAGNOSIS — R079 Chest pain, unspecified: Secondary | ICD-10-CM | POA: Diagnosis not present

## 2016-07-29 DIAGNOSIS — I25110 Atherosclerotic heart disease of native coronary artery with unstable angina pectoris: Secondary | ICD-10-CM | POA: Diagnosis not present

## 2016-07-29 DIAGNOSIS — Z833 Family history of diabetes mellitus: Secondary | ICD-10-CM | POA: Diagnosis not present

## 2016-07-29 DIAGNOSIS — Z8673 Personal history of transient ischemic attack (TIA), and cerebral infarction without residual deficits: Secondary | ICD-10-CM

## 2016-07-29 DIAGNOSIS — I480 Paroxysmal atrial fibrillation: Secondary | ICD-10-CM | POA: Diagnosis not present

## 2016-07-29 DIAGNOSIS — Z7901 Long term (current) use of anticoagulants: Secondary | ICD-10-CM | POA: Diagnosis not present

## 2016-07-29 DIAGNOSIS — R279 Unspecified lack of coordination: Secondary | ICD-10-CM | POA: Diagnosis not present

## 2016-07-29 DIAGNOSIS — K219 Gastro-esophageal reflux disease without esophagitis: Secondary | ICD-10-CM | POA: Diagnosis not present

## 2016-07-29 DIAGNOSIS — Z8249 Family history of ischemic heart disease and other diseases of the circulatory system: Secondary | ICD-10-CM

## 2016-07-29 DIAGNOSIS — Z794 Long term (current) use of insulin: Secondary | ICD-10-CM

## 2016-07-29 DIAGNOSIS — T82855A Stenosis of coronary artery stent, initial encounter: Secondary | ICD-10-CM | POA: Diagnosis present

## 2016-07-29 DIAGNOSIS — Z79899 Other long term (current) drug therapy: Secondary | ICD-10-CM

## 2016-07-29 DIAGNOSIS — Z91013 Allergy to seafood: Secondary | ICD-10-CM

## 2016-07-29 DIAGNOSIS — E785 Hyperlipidemia, unspecified: Secondary | ICD-10-CM | POA: Diagnosis present

## 2016-07-29 DIAGNOSIS — Z888 Allergy status to other drugs, medicaments and biological substances status: Secondary | ICD-10-CM | POA: Diagnosis not present

## 2016-07-29 DIAGNOSIS — M1990 Unspecified osteoarthritis, unspecified site: Secondary | ICD-10-CM | POA: Diagnosis not present

## 2016-07-29 DIAGNOSIS — K068 Other specified disorders of gingiva and edentulous alveolar ridge: Secondary | ICD-10-CM | POA: Diagnosis not present

## 2016-07-29 DIAGNOSIS — Z823 Family history of stroke: Secondary | ICD-10-CM | POA: Diagnosis not present

## 2016-07-29 DIAGNOSIS — R0682 Tachypnea, not elsewhere classified: Secondary | ICD-10-CM | POA: Diagnosis not present

## 2016-07-29 DIAGNOSIS — I252 Old myocardial infarction: Secondary | ICD-10-CM | POA: Diagnosis present

## 2016-07-29 DIAGNOSIS — R0789 Other chest pain: Secondary | ICD-10-CM | POA: Diagnosis not present

## 2016-07-29 DIAGNOSIS — I214 Non-ST elevation (NSTEMI) myocardial infarction: Secondary | ICD-10-CM | POA: Diagnosis not present

## 2016-07-29 DIAGNOSIS — I4891 Unspecified atrial fibrillation: Secondary | ICD-10-CM | POA: Diagnosis not present

## 2016-07-29 DIAGNOSIS — Z7982 Long term (current) use of aspirin: Secondary | ICD-10-CM | POA: Diagnosis not present

## 2016-07-29 DIAGNOSIS — F17210 Nicotine dependence, cigarettes, uncomplicated: Secondary | ICD-10-CM | POA: Diagnosis present

## 2016-07-29 DIAGNOSIS — Z6835 Body mass index (BMI) 35.0-35.9, adult: Secondary | ICD-10-CM

## 2016-07-29 DIAGNOSIS — E669 Obesity, unspecified: Secondary | ICD-10-CM | POA: Diagnosis present

## 2016-07-29 DIAGNOSIS — Z9989 Dependence on other enabling machines and devices: Secondary | ICD-10-CM | POA: Diagnosis not present

## 2016-07-29 DIAGNOSIS — I249 Acute ischemic heart disease, unspecified: Secondary | ICD-10-CM | POA: Diagnosis not present

## 2016-07-29 DIAGNOSIS — R110 Nausea: Secondary | ICD-10-CM | POA: Diagnosis not present

## 2016-07-29 DIAGNOSIS — I10 Essential (primary) hypertension: Secondary | ICD-10-CM | POA: Diagnosis present

## 2016-07-29 DIAGNOSIS — E119 Type 2 diabetes mellitus without complications: Secondary | ICD-10-CM | POA: Diagnosis present

## 2016-07-29 DIAGNOSIS — Z955 Presence of coronary angioplasty implant and graft: Secondary | ICD-10-CM | POA: Diagnosis not present

## 2016-07-29 DIAGNOSIS — I2510 Atherosclerotic heart disease of native coronary artery without angina pectoris: Secondary | ICD-10-CM | POA: Diagnosis not present

## 2016-07-29 LAB — TROPONIN I: Troponin I: 0.04 ng/mL (ref <0.03)

## 2016-07-29 LAB — GLUCOSE, CAPILLARY: Glucose-Capillary: 70 mg/dL (ref 65–99)

## 2016-07-29 LAB — HEPARIN LEVEL (UNFRACTIONATED): Heparin Unfractionated: 1.64 IU/mL — ABNORMAL HIGH (ref 0.30–0.70)

## 2016-07-29 LAB — MRSA PCR SCREENING: MRSA by PCR: NEGATIVE

## 2016-07-29 LAB — APTT: aPTT: 50 seconds — ABNORMAL HIGH (ref 24–36)

## 2016-07-29 MED ORDER — IRBESARTAN 150 MG PO TABS
150.0000 mg | ORAL_TABLET | Freq: Every day | ORAL | Status: DC
  Administered 2016-07-30 – 2016-07-31 (×2): 150 mg via ORAL
  Filled 2016-07-29 (×2): qty 1

## 2016-07-29 MED ORDER — ASPIRIN 81 MG PO CHEW
81.0000 mg | CHEWABLE_TABLET | ORAL | Status: AC
  Administered 2016-07-30: 81 mg via ORAL
  Filled 2016-07-29: qty 1

## 2016-07-29 MED ORDER — SERTRALINE HCL 50 MG PO TABS
50.0000 mg | ORAL_TABLET | Freq: Every day | ORAL | Status: DC
  Administered 2016-07-29 – 2016-07-30 (×2): 50 mg via ORAL
  Filled 2016-07-29 (×3): qty 1

## 2016-07-29 MED ORDER — NITROGLYCERIN 0.4 MG SL SUBL: 0.4000 mg | SUBLINGUAL_TABLET | SUBLINGUAL | Status: DC | PRN

## 2016-07-29 MED ORDER — HYDROCODONE-ACETAMINOPHEN 10-325 MG PO TABS
1.0000 | ORAL_TABLET | Freq: Three times a day (TID) | ORAL | Status: DC | PRN
  Administered 2016-07-29 – 2016-07-30 (×2): 1 via ORAL
  Filled 2016-07-29 (×2): qty 1

## 2016-07-29 MED ORDER — ISOSORBIDE MONONITRATE ER 30 MG PO TB24
30.0000 mg | ORAL_TABLET | Freq: Every day | ORAL | Status: DC
  Administered 2016-07-30 – 2016-07-31 (×2): 30 mg via ORAL
  Filled 2016-07-29 (×2): qty 1

## 2016-07-29 MED ORDER — SODIUM CHLORIDE 0.9 % WEIGHT BASED INFUSION
1.0000 mL/kg/h | INTRAVENOUS | Status: DC
  Administered 2016-07-30 (×2): 1 mL/kg/h via INTRAVENOUS

## 2016-07-29 MED ORDER — ASPIRIN EC 81 MG PO TBEC
81.0000 mg | DELAYED_RELEASE_TABLET | Freq: Every day | ORAL | Status: DC
  Administered 2016-07-30: 81 mg via ORAL
  Filled 2016-07-29: qty 1

## 2016-07-29 MED ORDER — ALPRAZOLAM 0.5 MG PO TABS
0.5000 mg | ORAL_TABLET | Freq: Two times a day (BID) | ORAL | Status: DC | PRN
  Administered 2016-07-30: 0.5 mg via ORAL
  Filled 2016-07-29: qty 1

## 2016-07-29 MED ORDER — PANTOPRAZOLE SODIUM 40 MG PO TBEC
40.0000 mg | DELAYED_RELEASE_TABLET | Freq: Every day | ORAL | Status: DC
  Administered 2016-07-30 – 2016-07-31 (×2): 40 mg via ORAL
  Filled 2016-07-29 (×2): qty 1

## 2016-07-29 MED ORDER — SODIUM CHLORIDE 0.9 % IV SOLN: 250.0000 mL | INTRAVENOUS | Status: DC | PRN

## 2016-07-29 MED ORDER — HEPARIN (PORCINE) IN NACL 100-0.45 UNIT/ML-% IJ SOLN
1400.0000 [IU]/h | INTRAMUSCULAR | Status: DC
  Administered 2016-07-29: 1400 [IU]/h via INTRAVENOUS
  Filled 2016-07-29 (×2): qty 250

## 2016-07-29 MED ORDER — METOPROLOL TARTRATE 12.5 MG HALF TABLET
12.5000 mg | ORAL_TABLET | Freq: Two times a day (BID) | ORAL | Status: DC
  Administered 2016-07-29 – 2016-07-31 (×4): 12.5 mg via ORAL
  Filled 2016-07-29 (×4): qty 1

## 2016-07-29 MED ORDER — SODIUM CHLORIDE 0.9 % WEIGHT BASED INFUSION
3.0000 mL/kg/h | INTRAVENOUS | Status: DC
Start: 2016-07-30 — End: 2016-07-30
  Administered 2016-07-30: 3 mL/kg/h via INTRAVENOUS

## 2016-07-29 MED ORDER — ACETAMINOPHEN 325 MG PO TABS: 650.0000 mg | ORAL_TABLET | ORAL | Status: DC | PRN

## 2016-07-29 MED ORDER — SODIUM CHLORIDE 0.9% FLUSH
3.0000 mL | Freq: Two times a day (BID) | INTRAVENOUS | Status: DC
  Administered 2016-07-29: 22:00:00 via INTRAVENOUS
  Administered 2016-07-30: 3 mL via INTRAVENOUS

## 2016-07-29 MED ORDER — AMLODIPINE BESYLATE 10 MG PO TABS: 10.0000 mg | ORAL_TABLET | Freq: Every day | ORAL | Status: DC

## 2016-07-29 MED ORDER — ONDANSETRON HCL 4 MG/2ML IJ SOLN: 4.0000 mg | Freq: Four times a day (QID) | INTRAMUSCULAR | Status: DC | PRN

## 2016-07-29 MED ORDER — SODIUM CHLORIDE 0.9% FLUSH: 3.0000 mL | INTRAVENOUS | Status: DC | PRN

## 2016-07-29 MED ORDER — RED YEAST RICE 600 MG PO TABS: 1.0000 | ORAL_TABLET | Freq: Every day | ORAL | Status: DC

## 2016-07-29 MED ORDER — GABAPENTIN 300 MG PO CAPS
300.0000 mg | ORAL_CAPSULE | Freq: Three times a day (TID) | ORAL | Status: DC
  Administered 2016-07-29 – 2016-07-31 (×5): 300 mg via ORAL
  Filled 2016-07-29 (×4): qty 1
  Filled 2016-07-29: qty 3

## 2016-07-29 NOTE — Progress Notes (Signed)
ANTICOAGULATION CONSULT NOTE - Initial Consult  Pharmacy Consult for IV heparin Indication: chest pain/ACS  Allergies  Allergen Reactions  . Shellfish Allergy Anaphylaxis and Other (See Comments)    Tongue swelling, hives  . Ace Inhibitors Other (See Comments) and Cough    CKD, renal failure   . Invokana [Canagliflozin] Other (See Comments)    Syncope / dehydration  . Metformin And Related Itching  . Pravastatin Sodium Other (See Comments)    myalgias  . Fenofibrate Other (See Comments)    Body aches  . Iodine Other (See Comments)    ????  . Lexapro [Escitalopram Oxalate] Other (See Comments)    Buzzing in ears,headache, felt like a zombie  . Lisinopril Cough  . Livalo [Pitavastatin] Other (See Comments)    Myalgias   . Crestor [Rosuvastatin] Other (See Comments)    Myalgias   . Horse-Derived Products Rash  . Lipitor [Atorvastatin] Other (See Comments)    myalgias    Patient Measurements: Height: 6\' 2"  (188 cm) Weight: 279 lb 6.4 oz (126.7 kg) IBW/kg (Calculated) : 82.2 Heparin Dosing Weight: 110 kg  Vital Signs: Temp: 98 F (36.7 C) (03/14 2012) Temp Source: Oral (03/14 2012) BP: 163/82 (03/14 2012) Pulse Rate: 68 (03/14 2012)  Labs: No results for input(s): HGB, HCT, PLT, APTT, LABPROT, INR, HEPARINUNFRC, HEPRLOWMOCWT, CREATININE, CKTOTAL, CKMB, TROPONINI in the last 72 hours.  Estimated Creatinine Clearance: 92.2 mL/min (by C-G formula based on SCr of 1.13 mg/dL).   Medical History: Past Medical History:  Diagnosis Date  . AODM   . Arthritis   . CAD (coronary artery disease)    2010  LAD 50%, RCA 100%.  DES to RCA.  EF 55%  . Cellulitis and abscess rt groin   . CHEST PAIN-UNSPECIFIED   . Diabetes mellitus   . Disorders of iron metabolism   . DISORDERS OF IRON METABOLISM   . GERD (gastroesophageal reflux disease)   . Hyperlipidemia   . Hypertension   . Low serum testosterone level   . Medically noncompliant   . MURMUR     Medications:   Scheduled:  . [START ON 07/30/2016] aspirin EC  81 mg Oral Daily  . gabapentin  300 mg Oral TID  . [START ON 07/30/2016] irbesartan  150 mg Oral Daily  . [START ON 07/30/2016] isosorbide mononitrate  30 mg Oral Daily  . metoprolol tartrate  12.5 mg Oral BID  . [START ON 07/30/2016] pantoprazole  40 mg Oral Daily  . sertraline  50 mg Oral Daily    Assessment: 66 yo male with hx CAD admitted with chest pain similar to prior angina.  Recently diagnosed afib, started on Eliquis.  Last Eliquis dose this AM at ~ 830 AM.  Presented to [facility] hospital this morning with chest pain, transferred to [facility] with NSTEMI.  Pharmacy asked to transition Eliquis to IV heparin.  Baseline PTT and heparin level pending.  Heparin levels likely falsely elevated due to Eliquis effect.  Goal of Therapy:  Heparin level 0.3-0.7 units/ml Monitor platelets by anticoagulation protocol: Yes   Plan:  1. Check baseline PTT and heparin level now. 2. Start IV heparin gtt at 1400 units/hr. (No bolus) 3. Check ptt 6 hrs after gtt starts. 4. Daily ptt and heparin level until correlating.  Uvaldo Rising, BCPS  Clinical Pharmacist Pager [phone]  07/29/2016 9:08 PM

## 2016-07-29 NOTE — Progress Notes (Signed)
PHARMACIST - PHYSICIAN ORDER COMMUNICATION  CONCERNING: P&T Medication Policy on Herbal Medications  DESCRIPTION:  This patient's order for:  Red Yeast Rice  has been noted.  This product(s) is classified as an "herbal" or natural product. Due to a lack of definitive safety studies or FDA approval, nonstandard manufacturing practices, plus the potential risk of unknown drug-drug interactions while on inpatient medications, the Pharmacy and Therapeutics Committee does not permit the use of "herbal" or natural products of this type within [place].   ACTION TAKEN: The pharmacy department is unable to verify this order at this time and your patient has been informed of this safety policy. Please reevaluate patient's clinical condition at discharge and address if the herbal or natural product(s) should be resumed at that time.   

## 2016-07-29 NOTE — H&P (Signed)
Patient ID: [name] [name] MRN: [id number], DOB/AGE: 1950-08-18   Admit date: 07/29/2016   Primary Physician: [name], FNP Primary Cardiologist: Dr. Harl Bowie / Dr. Lovena Le  Pt. Profile:  [name] [name] is a obese 66 year old male with past medical history of CAD s/p DES to RCA CTO 2010, hypertension, hyperlipidemia, DM II, GERD, remote stroke, and recently diagnosed PAF based on Loop recorder presented with chest pain reminiscent of prior angina. He was transferred from [place]  Past Medical History:  Diagnosis Date  . AODM   . Arthritis   . CAD (coronary artery disease)    2010  LAD 50%, RCA 100%.  DES to RCA.  EF 55%  . Cellulitis and abscess rt groin   . CHEST PAIN-UNSPECIFIED   . Diabetes mellitus   . Disorders of iron metabolism   . DISORDERS OF IRON METABOLISM   . GERD (gastroesophageal reflux disease)   . Hyperlipidemia   . Hypertension   . Low serum testosterone level   . Medically noncompliant   . MURMUR     Past Surgical History:  Procedure Laterality Date  . BACK SURGERY  2015   ACDF by Dr. [name]  . COLONOSCOPY N/A 10/01/2014   Procedure: COLONOSCOPY;  Surgeon: Daneil Dolin, MD;  [facility];  Service: Endoscopy;  Laterality: N/A;  815  . CORONARY STENT PLACEMENT  2000   By Dr. Olevia Perches  . EP IMPLANTABLE DEVICE N/A 05/25/2016   Procedure: Loop Recorder Insertion;  Surgeon: [name], MD;  Location: [facility];  Service: Cardiovascular;  Laterality: N/A;  . ESOPHAGOGASTRODUODENOSCOPY     esophagus stretched remotely at [facility]  . ESOPHAGOGASTRODUODENOSCOPY N/A 10/01/2014   Procedure: ESOPHAGOGASTRODUODENOSCOPY (EGD);  Surgeon: Daneil Dolin, MD;  Location: [facility];  Service: Endoscopy;  Laterality: N/A;  . HERNIA REPAIR  8315   umbilical  . LESION REMOVAL     Lip and hand   . NECK SURGERY       Allergies  Allergies  Allergen Reactions  . Shellfish Allergy Anaphylaxis and Other (See Comments)   Tongue swelling, hives  . Ace Inhibitors Other (See Comments) and Cough    CKD, renal failure   . Invokana [Canagliflozin] Other (See Comments)    Syncope / dehydration  . Metformin And Related Itching  . Pravastatin Sodium Other (See Comments)    myalgias  . Fenofibrate Other (See Comments)    Body aches  . Iodine Other (See Comments)    ????  . Lexapro [Escitalopram Oxalate] Other (See Comments)    Buzzing in ears,headache, felt like a zombie  . Lisinopril Cough  . Livalo [Pitavastatin] Other (See Comments)    Myalgias   . Crestor [Rosuvastatin] Other (See Comments)    Myalgias   . Horse-Derived Products Rash  . Lipitor [Atorvastatin] Other (See Comments)    myalgias    HPI  [name] is a obese 66 year old male with past medical history of CAD s/p DES to RCA CTO 2010, hypertension, hyperlipidemia, DM II, GERD, remote stroke, and recently diagnosed PAF based on Loop recorder. His last cardiac catheterization was on 08/08/2008 at which time a 2.75 x 23 mm Xience stent was used in successful PCI of chronically total occlusion of RCA. He had a previous Myoview that suggested possible old inferior infarction. In January of this year, patient had recurrent syncope and was admitted. At that time he was evaluated by Dr. Lovena Le and the implantable loop  recorder was placed. The loop recorder revealed he has new atrial fibrillation, he was subsequently placed on eliquis. His last dose of eliquis was this morning.  He was in his usual state of health when he started having left-sided substernal chest discomfort this morning around 11:30 AM while having a conversation with another person. He does not have any nitroglycerin at home, he eventually decided to contact EMS, he was given 4 baby aspirin and Nitro patch. His chest discomfort eventually went away after 2-3 hours. He was initially evaluated at [facility], troponin was borderline elevated at 0.02 and he was subsequently transferred  to [facility] as NSTEMI. Upon further discussing with the patient, this morning's chest discomfort is very similar to his previous chest discomfort prior to the previous stent. He also mentions he is sent over a remote transmission from his loop recorder this morning. We will contact [employer] rep to obtain the recording result, however this does not explain why he was having chest discomfort.   Home Medications  Prior to Admission medications   Medication Sig Start Date End Date Taking? Authorizing Provider  ALPRAZolam Duanne Moron) 0.5 MG tablet TAKE ONE TABLET BY MOUTH TWICE DAILY AS NEEDED 06/10/16  Yes [name], FNP  apixaban (ELIQUIS) 5 MG TABS tablet Take 1 tablet (5 mg total) by mouth 2 (two) times daily. 07/14/16  Yes [name], MD  Dulaglutide (TRULICITY) 1.5 WN/4.6EV SOPN Inject 1.5 mg into the skin once a week. Patient taking differently: Inject 1.5 mg into the skin every Wednesday.  06/25/16  Yes Cherre Robins, PharmD  furosemide (LASIX) 20 MG tablet TAKE ONE TABLET BY MOUTH ONCE DAILY AS NEEDED Patient taking differently: Take 20 mg by mouth daily as needed for fluid.  09/17/15  Yes Claretta Fraise, MD  gabapentin (NEURONTIN) 300 MG capsule Take 1 capsule (300 mg total) by mouth 3 (three) times daily. 09/17/15  Yes Claretta Fraise, MD  HYDROcodone-acetaminophen (NORCO) 10-325 MG tablet Take 1-2 tablets by mouth every 8 (eight) hours as needed for moderate pain.  01/10/16  Yes Historical Provider, MD  insulin NPH-regular Human (NOVOLIN 70/30 RELION) (70-30) 100 UNIT/ML injection Inject 80 Units into the skin 2 (two) times daily with a meal. Patient taking differently: Inject 95 Units into the skin 2 (two) times daily with a meal.  07/16/16  Yes [name], FNP  isosorbide mononitrate (IMDUR) 30 MG 24 hr tablet TAKE ONE TABLET BY MOUTH ONCE DAILY 05/04/16  Yes Claretta Fraise, MD  Lactobacillus (PROBIOTIC ACIDOPHILUS PO) Take 1 capsule by mouth daily.   Yes Historical Provider, MD    meloxicam (MOBIC) 15 MG tablet Take 15 mg by mouth daily.  07/07/16  Yes Historical Provider, MD  metoprolol tartrate (LOPRESSOR) 25 MG tablet Take 0.5 tablets (12.5 mg total) by mouth 2 (two) times daily. 09/17/15  Yes Claretta Fraise, MD  mometasone (NASONEX) 50 MCG/ACT nasal spray Place 2 sprays into the nose daily. Patient taking differently: Place 2 sprays into the nose daily as needed (congestion).  06/13/15  Yes Claretta Fraise, MD  omeprazole (PRILOSEC) 40 MG capsule TAKE ONE CAPSULE BY MOUTH TWICE DAILY HALF AN HOUR BEFORE BREAKFAST AND SUPPER 09/17/15  Yes Claretta Fraise, MD  Red Yeast Rice 600 MG TABS Take 600 mg by mouth 2 (two) times daily.    Yes Historical Provider, MD  sertraline (ZOLOFT) 50 MG tablet Take 1 tablet (50 mg total) by mouth daily. Patient taking differently: Take 25 mg by mouth every evening.  12/30/15  Yes [name], FNP  telmisartan (MICARDIS) 40 MG tablet TAKE ONE TABLET BY MOUTH ONCE DAILY (REPLACING  LISINOPRIL) 09/17/15  Yes Claretta Fraise, MD  amLODipine (NORVASC) 10 MG tablet Take 1 tablet (10 mg total) by mouth daily. Patient not taking: Reported on 07/29/2016 05/26/16   Shon Millet, DO  Insulin Pen Needle (PEN NEEDLES) 31G X 6 MM MISC Use to inject insulin two to three times a day Patient not taking: Reported on 07/29/2016 01/03/15   Cherre Robins, PharmD    Family History  Family History  Problem Relation Age of Onset  . Diabetes Father   . Valvular heart disease Father   . Arthritis Father   . Heart disease Father   . Stroke Father   . Alzheimer's disease Mother   . Hyperlipidemia Mother   . Hypertension Mother   . Arthritis Mother   . Lung cancer Mother   . Arthritis/Rheumatoid Sister   . Diabetes Sister   . Hypertension Sister   . Hyperlipidemia Sister   . Dementia Maternal Aunt   . Dementia Maternal Uncle   . Heart disease Maternal Uncle   . Stomach cancer Paternal Uncle   . Colon cancer Neg Hx   . Liver disease Neg Hx     Social  History  Social History   Social History  . Marital status: Married    Spouse name: N/A  . Number of children: 2  . Years of education: N/A   Occupational History  . retired    Social History Main Topics  . Smoking status: Current Some Day Smoker    Packs/day: 0.25    Years: 51.00    Types: Cigarettes  . Smokeless tobacco: Never Used     Comment: smokes  a pack a week  . Alcohol use No  . Drug use: No  . Sexual activity: Yes   Other Topics Concern  . Not on file   Social History Narrative   Lives with his wife.  Retired.  Unable to afford medicines.       Review of Systems General:  No chills, fever, night sweats or weight changes.  Cardiovascular:  No dyspnea on exertion, edema, orthopnea, palpitations, paroxysmal nocturnal dyspnea. +chest pain Dermatological: No rash, lesions/masses Respiratory: No cough, dyspnea Urologic: No hematuria, dysuria Abdominal:   No nausea, vomiting, diarrhea, bright red blood per rectum, melena, or hematemesis Neurologic:  No visual changes, wkns, changes in mental status. All other systems reviewed and are otherwise negative except as noted above.  Physical Exam  Blood pressure 140/60.  General: Pleasant, NAD Psych: Normal affect. Neuro: Alert and oriented X 3. Moves all extremities spontaneously. HEENT: Normal  Neck: Supple without bruits or JVD. Lungs:  Resp regular and unlabored, CTA. Heart: RRR no s3, s4, or murmurs. Abdomen: Soft, non-tender, non-distended, BS + x 4.  Extremities: No clubbing, cyanosis or edema. DP/PT/Radials 2+ and equal bilaterally.  Labs  Troponin (Point of Care Test) No results for input(s): TROPIPOC in the last 72 hours. No results for input(s): CKTOTAL, CKMB, TROPONINI in the last 72 hours. Lab Results  Component Value Date   WBC 10.1 05/26/2016   HGB 15.8 05/21/2016   HCT 44.5 05/26/2016   MCV 86 05/26/2016   PLT 279 05/26/2016   No results for input(s): NA, K, CL, CO2, BUN, CREATININE,  CALCIUM, PROT, BILITOT, ALKPHOS, ALT, AST, GLUCOSE in the last 168 hours.  Invalid input(s): LABALBU Lab Results  Component Value Date  CHOL 239 (H) 02/19/2016   HDL 28 (L) 02/19/2016   LDLCALC Comment 02/19/2016   TRIG 647 (HH) 02/19/2016   Lab Results  Component Value Date   DDIMER 0.87 (H) 02/11/2016     Radiology/Studies  No results found.  ECG  Outside EKG showed sinus rhythm with deeper T-wave inversion in the lateral leads when compared to the previous EKG in January 2018.  Echocardiogram 05/22/2016  LV EF: 55% -   60%   - Left ventricle: The cavity size was normal. Wall thickness was   increased in a pattern of mild LVH. Systolic function was normal.   The estimated ejection fraction was in the range of 55% to 60%.   Wall motion was normal; there were no regional wall motion   abnormalities. Features are consistent with a pseudonormal left   ventricular filling pattern, with concomitant abnormal relaxation   and increased filling pressure (grade 2 diastolic dysfunction).   Doppler parameters are consistent with high ventricular filling   pressure. - Aortic valve: Mildly calcified annulus. Trileaflet; mildly   thickened leaflets. Valve area (VTI): 2.13 cm^2. Valve area   (Vmax): 2.24 cm^2. Valve area (Vmean): 1.82 cm^2. - Mitral valve: Mildly calcified annulus. Normal thickness leaflets   . There was mild regurgitation. - Left atrium: The atrium was moderately dilated. - Atrial septum: No defect or patent foramen ovale was identified.    ASSESSMENT AND PLAN  1. Chest pain with borderline elevated troponin: Had a prolonged 2 to three-hour episode of chest discomfort and was evaluated at [facility] and subsequently transferred to [place] for further evaluation. Chest pain is reminiscent of the previous angina.  - I have discussed with DOD Dr. Gwenlyn Found, given the similarity of the current angina with the previous chest discomfort, and also given  the fact that he is having deeper T-wave inversion in the lateral leads, we recommended cardiac catheterization.  - Risk and benefit of procedure explained to the patient who display clear understanding and agree to proceed. Discussed with patient possible procedural risk include bleeding, vascular injury, renal injury, arrythmia, MI, stroke and loss of limb or life.   - Although patient has a documented history of how to allergy, I have discussed with the patient, he says the only reason iodine allergy is on his list is because in the past people was worried about cross reaction with his shellfish allergy. He has received contrast before and has never had a reaction to it. I do not think he need premedication this time.  2. CAD s/p DES to CTO RCA 2010  3. HTN  4. HLD  5. DM II: sliding scale   Signed, Almyra Deforest, PA-C 07/29/2016, 8:08 PM  Agree with note by Almyra Deforest PA-C  Agree with note by Almyra Deforest PA-C. Patient with prior coronary intervention 2010 of a RCA CTO. His other problems include treated hypertension, hyperlipidemia and diabetes. He developed chest pain today and went to [facility]. His troponins were mildly elevated. His pain resolved after several hours with topical nitrates. KG showed deeper inferolateral T-wave inversion. Based on this he'll need coronary angiography. He has been on Alupent for PAF. The last dose was smoking. I'm going to place him on IV heparin, cycle his enzymes and arrange for him to undergo radial. The patient understands that risks included but are not limited to stroke (1 in 1000), death (1 in 1000), kidney failure [usually temporary] (1 in 500), bleeding (1 in 200), allergic reaction [possibly  serious] (1 in 200). The patient understands and agrees to proceed  Lorretta Harp, M.D., [place], [facility], [place], [place] [address] [place], [place]  [address]  [phone] 07/29/2016 9:06 PM

## 2016-07-30 ENCOUNTER — Encounter (HOSPITAL_COMMUNITY)
Admission: AD | Disposition: A | Discharge status: Home / Self Care | Payer: Self-pay | Source: Other Acute Inpatient Hospital | Attending: Cardiovascular Disease

## 2016-07-30 DIAGNOSIS — Z823 Family history of stroke: Secondary | ICD-10-CM | POA: Diagnosis not present

## 2016-07-30 DIAGNOSIS — Z79899 Other long term (current) drug therapy: Secondary | ICD-10-CM | POA: Diagnosis not present

## 2016-07-30 DIAGNOSIS — F17210 Nicotine dependence, cigarettes, uncomplicated: Secondary | ICD-10-CM | POA: Diagnosis present

## 2016-07-30 DIAGNOSIS — Z91013 Allergy to seafood: Secondary | ICD-10-CM | POA: Diagnosis not present

## 2016-07-30 DIAGNOSIS — I480 Paroxysmal atrial fibrillation: Secondary | ICD-10-CM | POA: Diagnosis not present

## 2016-07-30 DIAGNOSIS — I2510 Atherosclerotic heart disease of native coronary artery without angina pectoris: Secondary | ICD-10-CM | POA: Diagnosis not present

## 2016-07-30 DIAGNOSIS — Z888 Allergy status to other drugs, medicaments and biological substances status: Secondary | ICD-10-CM | POA: Diagnosis not present

## 2016-07-30 DIAGNOSIS — Z8249 Family history of ischemic heart disease and other diseases of the circulatory system: Secondary | ICD-10-CM | POA: Diagnosis not present

## 2016-07-30 DIAGNOSIS — T82855A Stenosis of coronary artery stent, initial encounter: Secondary | ICD-10-CM | POA: Diagnosis not present

## 2016-07-30 DIAGNOSIS — Z7901 Long term (current) use of anticoagulants: Secondary | ICD-10-CM | POA: Diagnosis not present

## 2016-07-30 DIAGNOSIS — K068 Other specified disorders of gingiva and edentulous alveolar ridge: Secondary | ICD-10-CM | POA: Diagnosis present

## 2016-07-30 DIAGNOSIS — Z794 Long term (current) use of insulin: Secondary | ICD-10-CM | POA: Diagnosis not present

## 2016-07-30 DIAGNOSIS — Z8673 Personal history of transient ischemic attack (TIA), and cerebral infarction without residual deficits: Secondary | ICD-10-CM | POA: Diagnosis not present

## 2016-07-30 DIAGNOSIS — Z833 Family history of diabetes mellitus: Secondary | ICD-10-CM | POA: Diagnosis not present

## 2016-07-30 DIAGNOSIS — I10 Essential (primary) hypertension: Secondary | ICD-10-CM | POA: Diagnosis present

## 2016-07-30 DIAGNOSIS — R079 Chest pain, unspecified: Secondary | ICD-10-CM | POA: Diagnosis present

## 2016-07-30 DIAGNOSIS — E669 Obesity, unspecified: Secondary | ICD-10-CM | POA: Diagnosis present

## 2016-07-30 DIAGNOSIS — K219 Gastro-esophageal reflux disease without esophagitis: Secondary | ICD-10-CM | POA: Diagnosis present

## 2016-07-30 DIAGNOSIS — Z6835 Body mass index (BMI) 35.0-35.9, adult: Secondary | ICD-10-CM | POA: Diagnosis not present

## 2016-07-30 DIAGNOSIS — I214 Non-ST elevation (NSTEMI) myocardial infarction: Secondary | ICD-10-CM | POA: Diagnosis not present

## 2016-07-30 DIAGNOSIS — I25110 Atherosclerotic heart disease of native coronary artery with unstable angina pectoris: Secondary | ICD-10-CM | POA: Diagnosis present

## 2016-07-30 DIAGNOSIS — E119 Type 2 diabetes mellitus without complications: Secondary | ICD-10-CM | POA: Diagnosis not present

## 2016-07-30 DIAGNOSIS — E785 Hyperlipidemia, unspecified: Secondary | ICD-10-CM | POA: Diagnosis not present

## 2016-07-30 HISTORY — PX: CORONARY STENT INTERVENTION: CATH118234

## 2016-07-30 HISTORY — PX: LEFT HEART CATH AND CORONARY ANGIOGRAPHY: CATH118249

## 2016-07-30 LAB — CBC
HCT: 44.3 % (ref 39.0–52.0)
Hemoglobin: 14.8 g/dL (ref 13.0–17.0)
MCH: 29.2 pg (ref 26.0–34.0)
MCHC: 33.4 g/dL (ref 30.0–36.0)
MCV: 87.5 fL (ref 78.0–100.0)
Platelets: 223 10*3/uL (ref 150–400)
RBC: 5.06 MIL/uL (ref 4.22–5.81)
RDW: 13.5 % (ref 11.5–15.5)
WBC: 8.9 10*3/uL (ref 4.0–10.5)

## 2016-07-30 LAB — GLUCOSE, CAPILLARY
Glucose-Capillary: 159 mg/dL — ABNORMAL HIGH (ref 65–99)
Glucose-Capillary: 149 mg/dL — ABNORMAL HIGH (ref 65–99)
Glucose-Capillary: 171 mg/dL — ABNORMAL HIGH (ref 65–99)

## 2016-07-30 LAB — BASIC METABOLIC PANEL
Anion gap: 10 (ref 5–15)
BUN: 19 mg/dL (ref 6–20)
CO2: 23 mmol/L (ref 22–32)
Calcium: 9.2 mg/dL (ref 8.9–10.3)
Chloride: 101 mmol/L (ref 101–111)
Creatinine, Ser: 0.98 mg/dL (ref 0.61–1.24)
GFR calc Af Amer: >60 mL/min (ref >60)
GFR calc non Af Amer: >60 mL/min (ref >60)
Glucose, Bld: 165 mg/dL — ABNORMAL HIGH (ref 65–99)
Potassium: 4.5 mmol/L (ref 3.5–5.1)
Sodium: 134 mmol/L — ABNORMAL LOW (ref 135–145)

## 2016-07-30 LAB — TROPONIN I
Troponin I: 0.04 ng/mL (ref <0.03)
Troponin I: 0.03 ng/mL (ref <0.03)

## 2016-07-30 LAB — POCT ACTIVATED CLOTTING TIME
Activated Clotting Time: 296 seconds
Activated Clotting Time: 301 seconds

## 2016-07-30 LAB — APTT
aPTT: 66 seconds — ABNORMAL HIGH (ref 24–36)
aPTT: 66 seconds — ABNORMAL HIGH (ref 24–36)

## 2016-07-30 LAB — HEPARIN LEVEL (UNFRACTIONATED)
Heparin Unfractionated: 1.18 IU/mL — ABNORMAL HIGH (ref 0.30–0.70)
Heparin Unfractionated: 0.86 IU/mL — ABNORMAL HIGH (ref 0.30–0.70)

## 2016-07-30 SURGERY — LEFT HEART CATH AND CORONARY ANGIOGRAPHY
Anesthesia: LOCAL

## 2016-07-30 MED ORDER — HEPARIN (PORCINE) IN NACL 2-0.9 UNIT/ML-% IJ SOLN
INTRAMUSCULAR | Status: DC | PRN
  Administered 2016-07-30: 10 mL via INTRA_ARTERIAL

## 2016-07-30 MED ORDER — ALUM & MAG HYDROXIDE-SIMETH 200-200-20 MG/5ML PO SUSP
30.0000 mL | ORAL | Status: DC | PRN
  Administered 2016-07-30: 30 mL via ORAL
  Filled 2016-07-30: qty 30

## 2016-07-30 MED ORDER — NITROGLYCERIN 1 MG/10 ML FOR IR/CATH LAB
INTRA_ARTERIAL | Status: AC
  Filled 2016-07-30: qty 10

## 2016-07-30 MED ORDER — CLOPIDOGREL BISULFATE 75 MG PO TABS
75.0000 mg | ORAL_TABLET | Freq: Every day | ORAL | Status: DC
  Administered 2016-07-31: 75 mg via ORAL
  Filled 2016-07-30: qty 1

## 2016-07-30 MED ORDER — SODIUM CHLORIDE 0.9 % IV SOLN
INTRAVENOUS | Status: AC
  Administered 2016-07-30: 18:00:00 via INTRAVENOUS

## 2016-07-30 MED ORDER — CLOPIDOGREL BISULFATE 300 MG PO TABS
ORAL_TABLET | ORAL | Status: DC | PRN
  Administered 2016-07-30: 600 mg via ORAL

## 2016-07-30 MED ORDER — LABETALOL HCL 5 MG/ML IV SOLN: 10.0000 mg | INTRAVENOUS | Status: AC | PRN

## 2016-07-30 MED ORDER — HEPARIN SODIUM (PORCINE) 1000 UNIT/ML IJ SOLN
INTRAMUSCULAR | Status: AC
  Filled 2016-07-30: qty 1

## 2016-07-30 MED ORDER — FENTANYL CITRATE (PF) 100 MCG/2ML IJ SOLN
INTRAMUSCULAR | Status: DC | PRN
  Administered 2016-07-30 (×2): 25 ug via INTRAVENOUS

## 2016-07-30 MED ORDER — IOPAMIDOL (ISOVUE-370) INJECTION 76%
INTRAVENOUS | Status: AC
Start: 2016-07-30 — End: 2016-07-30
  Filled 2016-07-30: qty 100

## 2016-07-30 MED ORDER — SODIUM CHLORIDE 0.9% FLUSH
3.0000 mL | Freq: Two times a day (BID) | INTRAVENOUS | Status: DC
  Administered 2016-07-30 – 2016-07-31 (×2): 3 mL via INTRAVENOUS

## 2016-07-30 MED ORDER — LIDOCAINE HCL (PF) 1 % IJ SOLN
INTRAMUSCULAR | Status: DC | PRN
  Administered 2016-07-30: 2 mL

## 2016-07-30 MED ORDER — SODIUM CHLORIDE 0.9% FLUSH: 3.0000 mL | INTRAVENOUS | Status: DC | PRN

## 2016-07-30 MED ORDER — IOPAMIDOL (ISOVUE-370) INJECTION 76%
INTRAVENOUS | Status: AC
  Filled 2016-07-30: qty 100

## 2016-07-30 MED ORDER — CLOPIDOGREL BISULFATE 300 MG PO TABS
ORAL_TABLET | ORAL | Status: AC
  Filled 2016-07-30: qty 1

## 2016-07-30 MED ORDER — IOPAMIDOL (ISOVUE-370) INJECTION 76%
INTRAVENOUS | Status: DC | PRN
  Administered 2016-07-30: 170 mL via INTRA_ARTERIAL

## 2016-07-30 MED ORDER — HEPARIN (PORCINE) IN NACL 2-0.9 UNIT/ML-% IJ SOLN
INTRAMUSCULAR | Status: DC | PRN
  Administered 2016-07-30: 1000 mL

## 2016-07-30 MED ORDER — HEPARIN SODIUM (PORCINE) 1000 UNIT/ML IJ SOLN
INTRAMUSCULAR | Status: DC | PRN
  Administered 2016-07-30 (×2): 6000 [IU] via INTRAVENOUS

## 2016-07-30 MED ORDER — MIDAZOLAM HCL 2 MG/2ML IJ SOLN
INTRAMUSCULAR | Status: DC | PRN
  Administered 2016-07-30: 2 mg via INTRAVENOUS
  Administered 2016-07-30: 1 mg via INTRAVENOUS

## 2016-07-30 MED ORDER — HYDRALAZINE HCL 20 MG/ML IJ SOLN: 5.0000 mg | INTRAMUSCULAR | Status: AC | PRN

## 2016-07-30 MED ORDER — SODIUM CHLORIDE 0.9 % IV SOLN: 250.0000 mL | INTRAVENOUS | Status: DC | PRN

## 2016-07-30 MED ORDER — VERAPAMIL HCL 2.5 MG/ML IV SOLN
INTRAVENOUS | Status: AC
  Filled 2016-07-30: qty 2

## 2016-07-30 MED ORDER — NITROGLYCERIN 1 MG/10 ML FOR IR/CATH LAB
INTRA_ARTERIAL | Status: DC | PRN
  Administered 2016-07-30 (×3): 200 ug via INTRACORONARY

## 2016-07-30 MED ORDER — LIDOCAINE HCL (PF) 1 % IJ SOLN
INTRAMUSCULAR | Status: AC
  Filled 2016-07-30: qty 30

## 2016-07-30 MED ORDER — MIDAZOLAM HCL 2 MG/2ML IJ SOLN
INTRAMUSCULAR | Status: AC
  Filled 2016-07-30: qty 2

## 2016-07-30 MED ORDER — APIXABAN 5 MG PO TABS
5.0000 mg | ORAL_TABLET | Freq: Two times a day (BID) | ORAL | Status: DC
  Administered 2016-07-31: 5 mg via ORAL
  Filled 2016-07-30: qty 1

## 2016-07-30 MED ORDER — FENTANYL CITRATE (PF) 100 MCG/2ML IJ SOLN
INTRAMUSCULAR | Status: AC
  Filled 2016-07-30: qty 2

## 2016-07-30 MED ORDER — HEPARIN (PORCINE) IN NACL 2-0.9 UNIT/ML-% IJ SOLN
INTRAMUSCULAR | Status: AC
  Filled 2016-07-30: qty 1000

## 2016-07-30 SURGICAL SUPPLY — 17 items
BALLN EUPHORA RX 2.5X15 (BALLOONS) ×2
BALLN ~~LOC~~ EUPHORA RX 3.25X15 (BALLOONS) ×2
BALLOON EUPHORA RX 2.5X15 (BALLOONS) IMPLANT
BALLOON ~~LOC~~ EUPHORA RX 3.25X15 (BALLOONS) IMPLANT
CATH 5FR JL3.5 JR4 ANG PIG MP (CATHETERS) ×1 IMPLANT
DEVICE RAD COMP TR BAND LRG (VASCULAR PRODUCTS) ×1 IMPLANT
GLIDESHEATH SLEND SS 6F .021 (SHEATH) ×1 IMPLANT
GUIDEWIRE INQWIRE 1.5J.035X260 (WIRE) IMPLANT
INQWIRE 1.5J .035X260CM (WIRE) ×2
KIT ENCORE 26 ADVANTAGE (KITS) ×1 IMPLANT
KIT HEART LEFT (KITS) ×2 IMPLANT
PACK CARDIAC CATHETERIZATION (CUSTOM PROCEDURE TRAY) ×2 IMPLANT
STENT PROMUS PREM MR 3.0X28 (Permanent Stent) ×1 IMPLANT
STENT SYNERGY DES 2.5X20 (Permanent Stent) ×1 IMPLANT
TRANSDUCER W/STOPCOCK (MISCELLANEOUS) ×2 IMPLANT
TUBING CIL FLEX 10 FLL-RA (TUBING) ×2 IMPLANT
WIRE COUGAR XT STRL 190CM (WIRE) ×1 IMPLANT

## 2016-07-30 NOTE — Progress Notes (Signed)
Progress Note  Patient Name: [name] [name] Date of Encounter: 07/30/2016  Primary Cardiologist:  Dr. Harl Bowie / Dr. Lovena Le  Subjective   Chest pain free here. No shortness of breath.   Inpatient Medications    Scheduled Meds: . aspirin EC  81 mg Oral Daily  . gabapentin  300 mg Oral TID  . irbesartan  150 mg Oral Daily  . isosorbide mononitrate  30 mg Oral Daily  . metoprolol tartrate  12.5 mg Oral BID  . pantoprazole  40 mg Oral Daily  . sertraline  50 mg Oral Daily  . sodium chloride flush  3 mL Intravenous Q12H   Continuous Infusions: . sodium chloride 1 mL/kg/hr (07/30/16 0700)  . heparin 1,400 Units/hr (07/29/16 2205)   PRN Meds: sodium chloride, acetaminophen, ALPRAZolam, HYDROcodone-acetaminophen, nitroGLYCERIN, ondansetron (ZOFRAN) IV, sodium chloride flush   Vital Signs    Vitals:   07/29/16 2012 07/30/16 0007 07/30/16 0300 07/30/16 0700  BP: (!) 163/82 113/69 117/71 (!) 155/98  Pulse: 68 63 64 68  Resp:  16 16 14   Temp: 98 F (36.7 C) 98 F (36.7 C) 97.7 F (36.5 C) 97.7 F (36.5 C)  TempSrc: Oral Oral Oral Oral  SpO2:  96% 95% 99%  Weight: 279 lb 6.4 oz (126.7 kg)     Height: 6\' 2"  (1.88 m)       Intake/Output Summary (Last 24 hours) at 07/30/16 0910 Last data filed at 07/29/16 2205  Gross per 24 hour  Intake              240 ml  Output              300 ml  Net              -60 ml   Filed Weights   07/29/16 2012  Weight: 279 lb 6.4 oz (126.7 kg)    Telemetry    NSR- Personally Reviewed  ECG    NSR with TWI in inferior lateral leads - Personally Reviewed  Physical Exam   GEN: No acute distress.   Neck: No JVD Cardiac: RRR, no murmurs, rubs, or gallops.  Respiratory: Clear to auscultation bilaterally. GI: Soft, nontender, non-distended  MS: No edema; No deformity. Neuro:  Nonfocal  Psych: Normal affect   Labs    ChemistryNo results for input(s): NA, K, CL, CO2, GLUCOSE, BUN, CREATININE, CALCIUM, PROT, ALBUMIN, AST, ALT,  ALKPHOS, BILITOT, GFRNONAA, GFRAA, ANIONGAP in the last 168 hours.   HematologyNo results for input(s): WBC, RBC, HGB, HCT, MCV, MCH, MCHC, RDW, PLT in the last 168 hours.  Cardiac Enzymes Recent Labs Lab 07/29/16 2105 07/30/16 0239  TROPONINI 0.04* 0.04*   No results for input(s): TROPIPOC in the last 168 hours.   BNPNo results for input(s): BNP, PROBNP in the last 168 hours.   DDimer No results for input(s): DDIMER in the last 168 hours.   Radiology    No results found.  Cardiac Studies   Pending cath   Patient Profile     [name] [name] is a obese 66 year old male with past medical history of CAD s/p DES to RCA CTO 2010, hypertension, hyperlipidemia, DM II, GERD, remote stroke, and recently diagnosed PAF based on Loop recorder presented with chest pain reminiscent of prior angina. He was transferred from [place]    1. Unstable angina with hx of CAD s/p CTO of RCA in 2010 - Symptoms similar to prior angina. EKG with deeper anterior lateral TWI.  Troponin 0.04 x2. Chest pain free. - For cath later today. - Continue ASA, IV heparin, imdur, BB.   2. HLD - 02/19/2016: Cholesterol, Total 239; HDL 28; LDL Calculated Comment; Triglycerides 647  - Will repeat here. Statin intolerant. Consider lipid clinic referral as outpatient.   3. HTN - Elevated at times. Continue current medications.   Signed, Leanor Kail, PA  07/30/2016, 9:10 AM

## 2016-07-30 NOTE — Interval H&P Note (Signed)
Cath Lab Visit (complete for each Cath Lab visit)  Clinical Evaluation Leading to the Procedure:   ACS: Yes.    Non-ACS:    Anginal Classification: CCS IV  Anti-ischemic medical therapy: Maximal Therapy (2 or more classes of medications)  Non-Invasive Test Results: No non-invasive testing performed  Prior CABG: No previous CABG      History and Physical Interval Note:  07/30/2016 4:30 PM  [name]  has presented today for surgery, with the diagnosis of unstable angina  The various methods of treatment have been discussed with the patient and family. After consideration of risks, benefits and other options for treatment, the patient has consented to  Procedure(s): Left Heart Cath and Coronary Angiography (N/A) as a surgical intervention .  The patient's history has been reviewed, patient examined, no change in status, stable for surgery.  I have reviewed the patient's chart and labs.  Questions were answered to the patient's satisfaction.     [name] [name]

## 2016-07-30 NOTE — Progress Notes (Signed)
Notified Almyra Deforest, [place] of troponin 0.04. Clarified the shellfish allergy with patient and PA, no premeds needed for the allergy to iodine and shellfish per [place].

## 2016-07-30 NOTE — Progress Notes (Signed)
Patient watched the cardiac catheterization education video.

## 2016-07-30 NOTE — Progress Notes (Signed)
ANTICOAGULATION CONSULT NOTE - Follow Up Consult  Pharmacy Consult for heparin Indication: chest pain/ACS and atrial fibrillation  Labs:  Recent Labs  07/29/16 2105 07/30/16 0239  APTT 50* 66*  HEPARINUNFRC 1.64*  --   TROPONINI 0.04*  --     Assessment/Plan:  66yo male therapeutic on heparin with initial dosing while Eliquis on hold; of note heparin gtt was started late and PTT is result after only four hours of infusion, expect PTT to increase. Will continue gtt at current rate and confirm stable with additional PTT.   Wynona Neat, PharmD, BCPS  07/30/2016,3:51 AM

## 2016-07-30 NOTE — Progress Notes (Signed)
ANTICOAGULATION CONSULT NOTE - Follow Up Consult  Pharmacy Consult for heparin  Indication: chest pain/ACS and atrial fibrillation  Allergies  Allergen Reactions  . Shellfish Allergy Anaphylaxis and Other (See Comments)    Tongue swelling, hives  . Ace Inhibitors Other (See Comments) and Cough    CKD, renal failure   . Invokana [Canagliflozin] Other (See Comments)    Syncope / dehydration  . Metformin And Related Itching  . Pravastatin Sodium Other (See Comments)    myalgias  . Fenofibrate Other (See Comments)    Body aches  . Iodine Other (See Comments)    ????  . Lexapro [Escitalopram Oxalate] Other (See Comments)    Buzzing in ears,headache, felt like a zombie  . Lisinopril Cough  . Livalo [Pitavastatin] Other (See Comments)    Myalgias   . Crestor [Rosuvastatin] Other (See Comments)    Myalgias   . Horse-Derived Products Rash  . Lipitor [Atorvastatin] Other (See Comments)    myalgias    Patient Measurements: Height: 6\' 2"  (188 cm) Weight: 279 lb 6.4 oz (126.7 kg) IBW/kg (Calculated) : 82.2 Heparin Dosing Weight: 110 kg  Vital Signs: Temp: 97.8 F (36.6 C) (03/15 1100) Temp Source: Oral (03/15 1100) BP: 169/90 (03/15 1100) Pulse Rate: 66 (03/15 1100)  Labs:  Recent Labs  07/29/16 2105 07/30/16 0239 07/30/16 0830 07/30/16 1050  HGB  --   --   --  14.8  HCT  --   --   --  44.3  PLT  --   --   --  223  APTT 50* 66*  --  66*  HEPARINUNFRC 1.64* 1.18* 0.86*  --   CREATININE  --   --  0.98  --   TROPONINI 0.04* 0.04* 0.03*  --     Estimated Creatinine Clearance: 106.3 mL/min (by C-G formula based on SCr of 0.98 mg/dL).   Medications: heparin  Assessment: 66 yo male with hx of CAD admitted with chest pain and recently diagnosed Afib. APTT is therapeutic 66 (<<66) on heparin with initial dosing while Eliquis on hold, confirming stable with gtt at rate 1400 units/hr. HL trending down, but still not adequately in relation to APTT yet. CBC is normal. No  bleeding noted per patient. Cath planned today, follow up post-cath  Goal of Therapy:  Heparin level 0.3-0.7 units/ml Monitor platelets by anticoagulation protocol: Yes   Plan:  Continue heparin at 1400 units/hr  Follow up HL, APTT daily  Follow up CBC daily Monitor signs of bleeding  Letcher Schweikert,Neev Mcmains 07/30/2016,12:04 PM

## 2016-07-31 ENCOUNTER — Encounter (HOSPITAL_COMMUNITY): Payer: Self-pay | Admitting: Cardiovascular Disease

## 2016-07-31 LAB — LIPID PANEL
Cholesterol: 213 mg/dL — ABNORMAL HIGH (ref 0–200)
HDL: 25 mg/dL — ABNORMAL LOW (ref >40)
LDL Cholesterol: 137 mg/dL — ABNORMAL HIGH (ref 0–99)
Total CHOL/HDL Ratio: 8.5 RATIO
Triglycerides: 255 mg/dL — ABNORMAL HIGH (ref <150)
VLDL: 51 mg/dL — ABNORMAL HIGH (ref 0–40)

## 2016-07-31 LAB — CBC
HCT: 45.1 % (ref 39.0–52.0)
Hemoglobin: 15.2 g/dL (ref 13.0–17.0)
MCH: 29.6 pg (ref 26.0–34.0)
MCHC: 33.7 g/dL (ref 30.0–36.0)
MCV: 87.9 fL (ref 78.0–100.0)
Platelets: 202 10*3/uL (ref 150–400)
RBC: 5.13 MIL/uL (ref 4.22–5.81)
RDW: 13.5 % (ref 11.5–15.5)
WBC: 7.9 10*3/uL (ref 4.0–10.5)

## 2016-07-31 LAB — GLUCOSE, CAPILLARY: Glucose-Capillary: 152 mg/dL — ABNORMAL HIGH (ref 65–99)

## 2016-07-31 LAB — BASIC METABOLIC PANEL
Anion gap: 10 (ref 5–15)
BUN: 14 mg/dL (ref 6–20)
CO2: 24 mmol/L (ref 22–32)
Calcium: 9.3 mg/dL (ref 8.9–10.3)
Chloride: 101 mmol/L (ref 101–111)
Creatinine, Ser: 0.96 mg/dL (ref 0.61–1.24)
GFR calc Af Amer: >60 mL/min (ref >60)
GFR calc non Af Amer: >60 mL/min (ref >60)
Glucose, Bld: 131 mg/dL — ABNORMAL HIGH (ref 65–99)
Potassium: 4.1 mmol/L (ref 3.5–5.1)
Sodium: 135 mmol/L (ref 135–145)

## 2016-07-31 MED ORDER — PANTOPRAZOLE SODIUM 40 MG PO TBEC: 40.0000 mg | DELAYED_RELEASE_TABLET | Freq: Every day | ORAL | 11 refills | Status: DC

## 2016-07-31 MED ORDER — NITROGLYCERIN 0.4 MG SL SUBL: 0.4000 mg | SUBLINGUAL_TABLET | SUBLINGUAL | 2 refills | Status: DC | PRN

## 2016-07-31 MED ORDER — CLOPIDOGREL BISULFATE 75 MG PO TABS: 75.0000 mg | ORAL_TABLET | Freq: Every day | ORAL | 11 refills | Status: DC

## 2016-07-31 NOTE — Progress Notes (Signed)
Progress Note  Patient Name: [name] [name] Date of Encounter: 07/31/2016  Primary Cardiologist:  Dr. Harl Bowie / Dr. Lovena Le  Subjective   Chest pain free here. No shortness of breath. Postop day 1 circumflex and drug-eluting stent  Inpatient Medications    Scheduled Meds: . apixaban  5 mg Oral BID  . clopidogrel  75 mg Oral Q breakfast  . gabapentin  300 mg Oral TID  . irbesartan  150 mg Oral Daily  . isosorbide mononitrate  30 mg Oral Daily  . metoprolol tartrate  12.5 mg Oral BID  . pantoprazole  40 mg Oral Daily  . sertraline  50 mg Oral Daily  . sodium chloride flush  3 mL Intravenous Q12H   Continuous Infusions:  PRN Meds: sodium chloride, acetaminophen, ALPRAZolam, alum & mag hydroxide-simeth, HYDROcodone-acetaminophen, nitroGLYCERIN, ondansetron (ZOFRAN) IV, sodium chloride flush   Vital Signs    Vitals:   07/31/16 0400 07/31/16 0500 07/31/16 0837 07/31/16 0849  BP: (!) 127/59 123/60    Pulse: (!) 51 (!) 51  65  Resp: 18 14    Temp: 97.8 F (36.6 C)  97.7 F (36.5 C)   TempSrc: Oral  Oral   SpO2: 100% 97%    Weight:      Height:        Intake/Output Summary (Last 24 hours) at 07/31/16 0859 Last data filed at 07/31/16 0200  Gross per 24 hour  Intake          2096.13 ml  Output              900 ml  Net          1196.13 ml   Filed Weights   07/29/16 2012  Weight: 279 lb 6.4 oz (126.7 kg)    Telemetry    NSR- Personally Reviewed  ECG    NSR At 66 with nonspecific ST and T-wave changes Personally Reviewed  Physical Exam   GEN: No acute distress.   Neck: No JVD Cardiac: RRR, no murmurs, rubs, or gallops.  Respiratory: Clear to auscultation bilaterally. GI: Soft, nontender, non-distended  MS: No edema; No deformity. Neuro:  Nonfocal  Psych: Normal affect  Right radial puncture site okay otherwise no changes Labs    Chemistry  Recent Labs Lab 07/30/16 0830 07/31/16 0258  NA 134* 135  K 4.5 4.1  CL 101 101  CO2 23 24  GLUCOSE 165*  131*  BUN 19 14  CREATININE 0.98 0.96  CALCIUM 9.2 9.3  GFRNONAA >60 >60  GFRAA >60 >60  ANIONGAP 10 10     Hematology  Recent Labs Lab 07/30/16 1050 07/31/16 0258  WBC 8.9 7.9  RBC 5.06 5.13  HGB 14.8 15.2  HCT 44.3 45.1  MCV 87.5 87.9  MCH 29.2 29.6  MCHC 33.4 33.7  RDW 13.5 13.5  PLT 223 202    Cardiac Enzymes  Recent Labs Lab 07/29/16 2105 07/30/16 0239 07/30/16 0830  TROPONINI 0.04* 0.04* 0.03*   No results for input(s): TROPIPOC in the last 168 hours.   BNPNo results for input(s): BNP, PROBNP in the last 168 hours.   DDimer No results for input(s): DDIMER in the last 168 hours.   Radiology    No results found.  Cardiac Studies   Cardiac catheterization 07/30/16  Conclusion   1. Mild nonobstructive LAD stenosis with severe branch vessel disease involving the first and second diagonals 2. Total occlusion of the right coronary artery within the previously implanted stent from 2010  with TIMI 2 antegrade flow and brisk collateral filling the PDA from the septal perforators of the LAD 3. Severe complex disease in the left circumflex extending into the first obtuse marginal branch, treated successfully with overlapping drug-eluting stents 4. Normal LV function by echo assessment  Recommendations:  Resume anticoagulation with Eliquis tomorrow for PAF  Plavix 75 mg daily (loaded with 600 mg in cath lab)  No ASA (has had gingival bleeding and don't think he will tolerate triple anticoagulant therapy)  Consider PCI of the RCA if refractory angina. However, with collateral filling of the PDA and need for chronic anticoagulation, concern over bleeding risk, may be best for medical therapy     Patient Profile     [name] [name] is a obese 66 year old male with past medical history of CAD s/p DES to RCA CTO 2010, hypertension, hyperlipidemia, DM II, GERD, remote stroke, and recently diagnosed PAF based on Loop recorder presented with chest pain reminiscent of  prior angina. He was transferred from [facility]. He ruled out for myocardial infarction by enzymes. He underwent cardiac catheterization by Dr. Burt Knack yesterday that revealed a subtotal mid RCA stent with bidirectional collaterals, diagonal branch disease, occluded circumflex in the midportion with high-grade first obtuse marginal branch stenosis throughout its entirety. He underwent PCI and drug-eluting stenting of the circumflex obtuse marginal branch successfully with excellent result. He was placed on Plavix.  Assessment & Plan    1. Unstable angina with hx of CAD s/p CTO of RCA in 2010 - Symptoms similar to prior angina. EKG with deeper anterior lateral TWI. Troponin 0.04 x2. Chest pain free. - Status post left heart cath yesterday by Dr. Burt Knack revealing a subtotally occluded mid RCA stents with bidirectional collaterals, total circumflex in the midportion after a highly diseased first obtuse marginal branch and patent LAD with grade 3 left-to-right collaterals. He did have bilateral breast disease but these were small. He underwent successful DES stenting of the first obtuse marginal branch. He is pain-free overnight. The decision was to treat him with double therapy including Plavix and Eliquis. Aspirin will be withheld given his gingival bleeding.  2. HLD - 07/31/2016: Cholesterol 213; HDL 25; LDL Cholesterol 137; Triglycerides 255; VLDL 51  - Will repeat here. Statin intolerant. Consider lipid clinic referral as outpatient.   3. HTN - Elevated at times. Continue current medications.   Successful circumflex obtuse marginal branch stenting in the setting of accelerated angina with residual disease in his RCA, distal circumflex and diagonal branches. He is stable for discharge. His radial puncture site looks fine. He will be on Eliquis and Plavix per Dr. Antionette Char recommendations. He will follow up with mid level provider in 7-10 days with Dr. Crissie Sickles, his primary cardiologist, in 4-6  weeks.  Angelina Sheriff, MD  07/31/2016, 8:59 AM

## 2016-07-31 NOTE — Discharge Summary (Signed)
Discharge Summary    Patient ID: [name] [name],  MRN: [id number], DOB/AGE: [birth year] 66 y.o.  Admit date: 07/29/2016 Discharge date: 07/31/2016  Primary Care Provider: [name] Primary Cardiologist: Dr. Harl Bowie Dr. Lovena Le  Discharge Diagnoses    Active Problems:   NSTEMI (non-ST elevated myocardial infarction) [facility])   History of Present Illness     [name] [name] is a 66 y.o. male with past medical history of CAD (s/p DES to CTO of RCA in 2010), HTN, HLD, Type 2 DM, prior CVA, and recently diagnosed PAF (on Eliquis) who presented to [facility] on 07/29/2016 as a transfer from [facility] for chest pain.   He was in his usual state of health until he started having left-sided substernal chest discomfort the morning of his presentation. He did not have any nitroglycerin at home, therefore he eventually decided to contact EMS. Chest discomfort lasted for 2-3 hours. He was initially evaluated at [facility], where troponin was borderline elevated at 0.02 and he was subsequently transferred to [facility] as an NSTEMI.   Hospital Course     Consultants: None   The following morning, he denied any recurrent episodes of chest pain. Troponin values remained flat at 0.04. A cardiac catheterization was performed later in the day which showed total occlusion of the RCA within the previously placed stent with collateral flow filling the PDA from the septal perforators of the LAD. There was severe stenosis along the LCx extending to the OM1 which was treated with two overlapping DES. He was resumed on Eliquis and placed on Plavix. Not on triple therapy due to already experiencing gingival bleeding.   The following morning, he denied any recurrent chest discomfort. Referral was sent to cardiac rehab. He was last examined by Dr. Gwenlyn Found and deemed stable for discharge. He will continue on Eliquis, Plavix, Imdur, and BB therapy. Intolerant to statins, therefore referral to the  Lipid Clinic for initiation of PCSK9 inhibitors will need to be considered as an outpatient. Two week hospital follow-up has been arranged (at [facility]. as there are no available appointment slots in [place], [name]). He was discharged home in good condition.  _____________  Discharge Vitals Blood pressure 123/60, pulse 65, temperature 97.7 F (36.5 C), temperature source Oral, resp. rate 14, height 6\' 2"  (1.88 m), weight 279 lb 6.4 oz (126.7 kg), SpO2 97 %.  Filed Weights   07/29/16 2012  Weight: 279 lb 6.4 oz (126.7 kg)    Labs & Radiologic Studies     CBC  Recent Labs  07/30/16 1050 07/31/16 0258  WBC 8.9 7.9  HGB 14.8 15.2  HCT 44.3 45.1  MCV 87.5 87.9  PLT 223 175   Basic Metabolic Panel  Recent Labs  07/30/16 0830 07/31/16 0258  NA 134* 135  K 4.5 4.1  CL 101 101  CO2 23 24  GLUCOSE 165* 131*  BUN 19 14  CREATININE 0.98 0.96  CALCIUM 9.2 9.3   Liver Function Tests No results for input(s): AST, ALT, ALKPHOS, BILITOT, PROT, ALBUMIN in the last 72 hours. No results for input(s): LIPASE, AMYLASE in the last 72 hours. Cardiac Enzymes  Recent Labs  07/29/16 2105 07/30/16 0239 07/30/16 0830  TROPONINI 0.04* 0.04* 0.03*   BNP Invalid input(s): POCBNP D-Dimer No results for input(s): DDIMER in the last 72 hours. Hemoglobin A1C No results for input(s): HGBA1C in the last 72 hours. Fasting Lipid Panel  Recent Labs  07/31/16 0258  CHOL 213*  HDL 25*  LDLCALC 137*  TRIG 255*  CHOLHDL 8.5   Thyroid Function Tests No results for input(s): TSH, T4TOTAL, T3FREE, THYROIDAB in the last 72 hours.  Invalid input(s): FREET3  No results found.   Diagnostic Studies/Procedures     Cardiac Catheterization: 07/30/2016 1. Mild nonobstructive LAD stenosis with severe branch vessel disease involving the first and second diagonals 2. Total occlusion of the right coronary artery within the previously implanted stent from 2010 with TIMI 2 antegrade flow and  brisk collateral filling the PDA from the septal perforators of the LAD 3. Severe complex disease in the left circumflex extending into the first obtuse marginal branch, treated successfully with overlapping drug-eluting stents 4. Normal LV function by echo assessment  Recommendations:  Resume anticoagulation with Eliquis tomorrow for PAF  Plavix 75 mg daily (loaded with 600 mg in cath lab)  No ASA (has had gingival bleeding and don't think he will tolerate triple anticoagulant therapy)  Consider PCI of the RCA if refractory angina. However, with collateral filling of the PDA and need for chronic anticoagulation, concern over bleeding risk, may be best for medical therapy    Disposition   Pt is being discharged home today in good condition.  Follow-up Plans & Appointments    Follow-up Information    Lyda Jester, PA-C Follow up.   Specialties:  Cardiology, Radiology Why:  Cardiology Hospital Follow-Up on 08/13/2016 at 3:30PM.  Contact information: [place] [address] [phone]          Discharge Instructions    Diet - low sodium heart healthy    Complete by:  As directed    Discharge instructions    Complete by:  As directed    PLEASE REMEMBER TO BRING ALL OF YOUR MEDICATIONS TO [place].  PLEASE ATTEND ALL SCHEDULED FOLLOW-UP APPOINTMENTS.   Activity: Increase activity slowly as tolerated. You may shower, but no soaking baths (or swimming) for 1 week. No driving for 24 hours. No lifting over 5 lbs for 1 week. No sexual activity for 1 week.   You May Return to Work: in 1 week (if applicable)  Wound Care: You may wash cath site gently with soap and water. Keep cath site clean and dry. If you notice pain, swelling, bleeding or pus at your cath site, please call [phone].   Increase activity slowly    Complete by:  As directed       Discharge Medications     Medication List    STOP taking these  medications   amLODipine 10 MG tablet Commonly known as:  NORVASC   meloxicam 15 MG tablet Commonly known as:  MOBIC   omeprazole 40 MG capsule Commonly known as:  PRILOSEC Replaced by:  pantoprazole 40 MG tablet     TAKE these medications   ALPRAZolam 0.5 MG tablet Commonly known as:  XANAX TAKE ONE TABLET BY MOUTH TWICE DAILY AS NEEDED   apixaban 5 MG Tabs tablet Commonly known as:  ELIQUIS Take 1 tablet (5 mg total) by mouth 2 (two) times daily.   clopidogrel 75 MG tablet Commonly known as:  PLAVIX Take 1 tablet (75 mg total) by mouth daily with breakfast. Start taking on:  08/01/2016   Dulaglutide 1.5 MG/0.5ML Sopn Commonly known as:  TRULICITY Inject 1.5 mg into the skin once a week. What changed:  when to take this   furosemide 20 MG tablet Commonly known as:  LASIX TAKE ONE  TABLET BY MOUTH ONCE DAILY AS NEEDED What changed:  how much to take  how to take this  when to take this  reasons to take this  additional instructions   gabapentin 300 MG capsule Commonly known as:  NEURONTIN Take 1 capsule (300 mg total) by mouth 3 (three) times daily.   HYDROcodone-acetaminophen 10-325 MG tablet Commonly known as:  NORCO Take 1-2 tablets by mouth every 8 (eight) hours as needed for moderate pain.   insulin NPH-regular Human (70-30) 100 UNIT/ML injection Commonly known as:  NOVOLIN 70/30 RELION Inject 80 Units into the skin 2 (two) times daily with a meal. What changed:  how much to take   isosorbide mononitrate 30 MG 24 hr tablet Commonly known as:  IMDUR TAKE ONE TABLET BY MOUTH ONCE DAILY   metoprolol tartrate 25 MG tablet Commonly known as:  LOPRESSOR Take 0.5 tablets (12.5 mg total) by mouth 2 (two) times daily.   mometasone 50 MCG/ACT nasal spray Commonly known as:  NASONEX Place 2 sprays into the nose daily. What changed:  when to take this  reasons to take this   nitroGLYCERIN 0.4 MG SL tablet Commonly known as:  NITROSTAT Place 1  tablet (0.4 mg total) under the tongue every 5 (five) minutes x 3 doses as needed for chest pain.   pantoprazole 40 MG tablet Commonly known as:  PROTONIX Take 1 tablet (40 mg total) by mouth daily. Start taking on:  08/01/2016 Replaces:  omeprazole 40 MG capsule   Pen Needles 31G X 6 MM Misc Use to inject insulin two to three times a day   PROBIOTIC ACIDOPHILUS PO Take 1 capsule by mouth daily.   Red Yeast Rice 600 MG Tabs Take 600 mg by mouth 2 (two) times daily.   sertraline 50 MG tablet Commonly known as:  ZOLOFT Take 1 tablet (50 mg total) by mouth daily. What changed:  how much to take  when to take this   telmisartan 40 MG tablet Commonly known as:  MICARDIS TAKE ONE TABLET BY MOUTH ONCE DAILY (REPLACING  LISINOPRIL)       Aspirin prescribed at discharge?  No: On Eliquis and Plavix.  High Intensity Statin Prescribed? (Lipitor 40-80mg  or Crestor 20-40mg ): No: Intolerant to statin therapy. Beta Blocker Prescribed? Yes For EF 40% or less, Was ACEI/ARB Prescribed? Yes ADP Receptor Inhibitor Prescribed? (i.e. Plavix etc.-Includes Medically Managed Patients): Yes For EF <40%, Aldosterone Inhibitor Prescribed? No: EF > 40% Was EF assessed during THIS hospitalization? Yes Was Cardiac Rehab II ordered? (Included Medically managed Patients): Yes   Allergies Allergies  Allergen Reactions  . Shellfish Allergy Anaphylaxis and Other (See Comments)    Tongue swelling, hives  . Ace Inhibitors Other (See Comments) and Cough    CKD, renal failure   . Invokana [Canagliflozin] Other (See Comments)    Syncope / dehydration  . Metformin And Related Itching  . Pravastatin Sodium Other (See Comments)    myalgias  . Fenofibrate Other (See Comments)    Body aches  . Iodine Other (See Comments)    ????  . Lexapro [Escitalopram Oxalate] Other (See Comments)    Buzzing in ears,headache, felt like a zombie  . Lisinopril Cough  . Livalo [Pitavastatin] Other (See Comments)     Myalgias   . Crestor [Rosuvastatin] Other (See Comments)    Myalgias   . Horse-Derived Products Rash  . Lipitor [Atorvastatin] Other (See Comments)    myalgias     Outstanding Labs/Studies   None  Duration of Discharge Encounter   Greater than 30 minutes including physician time.  Signed, Erma Heritage, PA-C 07/31/2016, 2:30 PM  Agree with note by Guinevere Scarlet  Status post circumflex marginal PCI drug-eluting stenting by Dr. Burt Knack with occluded RCA stent. The patient is otherwise stable for discharge on dual antiplatelet therapy.  Lorretta Harp, M.D., [place], [employer], [place] [address] [place], [place]  [address]  [phone] 07/31/2016 2:57 PM

## 2016-07-31 NOTE — Care Management Note (Signed)
Case Management Note  Patient Details  Name: [name] [name] MRN: [id number] Date of Birth: Apr 09, 1951  Subjective/Objective:    Pt admitted with NSTEMI               Action/Plan:   PTA independent from home with wife.  Pt active with PCP group and denied barriers to obtaining medications.  No CM needs identified prior to discharge   Expected Discharge Date:  07/31/16               Expected Discharge Plan:  Home/Self Care  In-House Referral:     Discharge planning Services  CM Consult  Post Acute Care Choice:    Choice offered to:     DME Arranged:    DME Agency:     HH Arranged:    [place] Agency:     Status of Service:  Completed, signed off  If discussed at [employer] of Stay Meetings, dates discussed:    Additional Comments:  Maryclare Labrador, RN 07/31/2016, 10:32 AM

## 2016-08-03 ENCOUNTER — Encounter: Payer: Self-pay | Admitting: *Deleted

## 2016-08-09 ENCOUNTER — Other Ambulatory Visit: Payer: Self-pay | Admitting: Family

## 2016-08-10 ENCOUNTER — Other Ambulatory Visit: Payer: Self-pay | Admitting: *Deleted

## 2016-08-10 ENCOUNTER — Telehealth (HOSPITAL_COMMUNITY): Payer: Self-pay | Admitting: *Deleted

## 2016-08-10 MED ORDER — SERTRALINE HCL 50 MG PO TABS: 50.0000 mg | ORAL_TABLET | Freq: Every day | ORAL | 0 refills | Status: DC

## 2016-08-13 ENCOUNTER — Encounter: Payer: Self-pay | Admitting: Cardiology

## 2016-08-13 ENCOUNTER — Telehealth: Payer: Self-pay | Admitting: Cardiology

## 2016-08-13 ENCOUNTER — Encounter (INDEPENDENT_AMBULATORY_CARE_PROVIDER_SITE_OTHER): Payer: Self-pay

## 2016-08-13 ENCOUNTER — Ambulatory Visit (INDEPENDENT_AMBULATORY_CARE_PROVIDER_SITE_OTHER): Payer: Medicare Other | Admitting: Cardiology

## 2016-08-13 VITALS — BP 126/78 | HR 76 | Ht 74.0 in | Wt 285.4 lb

## 2016-08-13 DIAGNOSIS — E785 Hyperlipidemia, unspecified: Secondary | ICD-10-CM

## 2016-08-13 DIAGNOSIS — I2510 Atherosclerotic heart disease of native coronary artery without angina pectoris: Secondary | ICD-10-CM | POA: Diagnosis not present

## 2016-08-13 DIAGNOSIS — Z9861 Coronary angioplasty status: Secondary | ICD-10-CM | POA: Diagnosis not present

## 2016-08-13 MED ORDER — ROSUVASTATIN CALCIUM 5 MG PO TABS: 5.0000 mg | ORAL_TABLET | ORAL | 3 refills | Status: DC

## 2016-08-13 NOTE — Patient Instructions (Addendum)
Medication Instructions:   START TAKING CRESTOR 5 MG EVERY OTHER DAY   If you need a refill on your cardiac medications before your next appointment, please call your pharmacy.  Labwork: RETURN IN 6 WEEKS FOR FASTING LABS LFT AND LIPID   Testing/Procedures: NONE ORDERED  TODAY   Follow-Up:   IN 8 WEEKS WITH  NP LAWRENCE IN [place]     Any Other Special Instructions Will Be Listed Below (If Applicable).

## 2016-08-13 NOTE — Telephone Encounter (Signed)
Spoke w/ pt and requested that h send a manual transmission b/c his home monitor has not updated in at least 14 days.

## 2016-08-13 NOTE — Progress Notes (Signed)
08/13/2016 [name] [name]   Feb 28, 1951  [id number]  Primary Physician [name], FNP Primary Cardiologist: Dr. Harl Bowie Electrophysiologist: Dr. Lovena Le  Reason for Visit/CC: [facility] f/u s/p NSTEMI  HPI:  [name] [name] presents to clinic today for post [name] f/u. He is a 66 y.o. male with past medical history of CAD (s/p DES to CTO of RCA in 2010), HTN, HLD, Type 2 DM, prior CVA, and recently diagnosed PAF (on Eliquis) who presented to [facility] on 07/[facility]/2016 as a transfer from [name] for chest pain. He ruled in for NSTEMI. Troponin peaked at 0.4. LHC was performed which showed total occlusion of the RCA within the previously placed stent with collateral flow filling the PDA from the septal perforators of the LAD. There was severe stenosis along the LCx extending to the OM1 which was treated with two overlapping DES. LVEF was noted to be normal. He was resumed on Eliquis and placed on Plavix. Not on triple therapy due to already experiencing gingival bleeding. He was also continued on a BB and Imdur. Given h/o statin intolerance, a referral was placed for evaluation in the Lipid clinic for initiation of PCSK9 inhibitors..  Today in f/u, he reports that he has done well. He had 1 episode of chest discomfort/indigestion after eating a [employer]. This was relieved after 1 SL NTG.  He denies any further recurrence. No exertional CP or dyspnea.   No post cath complications. He reports full medication compliance. No abnormal bleeding.    Current Meds  Medication Sig  . ALPRAZolam (XANAX) 0.5 MG tablet TAKE ONE TABLET BY MOUTH TWICE DAILY AS NEEDED  . apixaban (ELIQUIS) 5 MG TABS tablet Take 1 tablet (5 mg total) by mouth 2 (two) times daily.  . clopidogrel (PLAVIX) 75 MG tablet Take 1 tablet (75 mg total) by mouth daily with breakfast.  . Dulaglutide (TRULICITY) 1.5 NL/8.9QJ SOPN Inject 1.5 mg into the skin as directed. Every friday  . furosemide (LASIX) 20 MG tablet Take  20 mg by mouth daily.  Marland Kitchen gabapentin (NEURONTIN) 300 MG capsule Take 1 capsule (300 mg total) by mouth 3 (three) times daily.  Marland Kitchen HYDROcodone-acetaminophen (NORCO) 10-325 MG tablet Take 1-2 tablets by mouth every 8 (eight) hours as needed for moderate pain.   Marland Kitchen insulin NPH-regular Human (NOVOLIN 70/30) (70-30) 100 UNIT/ML injection Inject 80 Units into the skin 2 (two) times daily with a meal.  . isosorbide mononitrate (IMDUR) 30 MG 24 hr tablet TAKE ONE TABLET BY MOUTH ONCE DAILY  . Lactobacillus (PROBIOTIC ACIDOPHILUS PO) Take 1 capsule by mouth daily.  . metoprolol tartrate (LOPRESSOR) 25 MG tablet Take 0.5 tablets (12.5 mg total) by mouth 2 (two) times daily.  . mometasone (NASONEX) 50 MCG/ACT nasal spray Place 2 sprays into the nose as needed.  . nitroGLYCERIN (NITROSTAT) 0.4 MG SL tablet Place 1 tablet (0.4 mg total) under the tongue every 5 (five) minutes x 3 doses as needed for chest pain.  . pantoprazole (PROTONIX) 40 MG tablet Take 1 tablet (40 mg total) by mouth daily.  . Red Yeast Rice 600 MG TABS Take 600 mg by mouth 2 (two) times daily.   . sertraline (ZOLOFT) 50 MG tablet Take 1 tablet (50 mg total) by mouth daily.  Marland Kitchen telmisartan (MICARDIS) 40 MG tablet TAKE ONE TABLET BY MOUTH ONCE DAILY (REPLACING  LISINOPRIL)   Allergies  Allergen Reactions  . Shellfish Allergy Anaphylaxis and Other (See Comments)    Tongue swelling, hives  . Ace  Inhibitors Other (See Comments) and Cough    CKD, renal failure   . Invokana [Canagliflozin] Other (See Comments)    Syncope / dehydration  . Metformin And Related Itching  . Pravastatin Sodium Other (See Comments)    myalgias  . Fenofibrate Other (See Comments)    Body aches  . Iodine Other (See Comments)    ????  . Lexapro [Escitalopram Oxalate] Other (See Comments)    Buzzing in ears,headache, felt like a zombie  . Lisinopril Cough  . Livalo [Pitavastatin] Other (See Comments)    Myalgias   . Crestor [Rosuvastatin] Other (See Comments)      Myalgias   . Horse-Derived Products Rash  . Lipitor [Atorvastatin] Other (See Comments)    myalgias   Past Medical History:  Diagnosis Date  . AODM   . Arthritis   . CAD (coronary artery disease)    a. 2010: DES to CTO of RCA. EF 55% c. 07/2016: cath showing total occlusion within previously placed RCA stent (collaterals present), severe stenosis along LCx and OM1 (treated with 2 overlapping DES).   . Cellulitis and abscess rt groin   . CHEST PAIN-UNSPECIFIED   . Diabetes mellitus   . Disorders of iron metabolism   . DISORDERS OF IRON METABOLISM   . GERD (gastroesophageal reflux disease)   . Hyperlipidemia   . Hypertension   . Low serum testosterone level   . Medically noncompliant   . MURMUR    Family History  Problem Relation Age of Onset  . Diabetes Father   . Valvular heart disease Father   . Arthritis Father   . Heart disease Father   . Stroke Father   . Alzheimer's disease Mother   . Hyperlipidemia Mother   . Hypertension Mother   . Arthritis Mother   . Lung cancer Mother   . Arthritis/Rheumatoid Sister   . Diabetes Sister   . Hypertension Sister   . Hyperlipidemia Sister   . Dementia Maternal Aunt   . Dementia Maternal Uncle   . Heart disease Maternal Uncle   . Stomach cancer Paternal Uncle   . Colon cancer Neg Hx   . Liver disease Neg Hx    Past Surgical History:  Procedure Laterality Date  . BACK SURGERY  2015   ACDF by Dr. [name]  . COLONOSCOPY N/A 10/01/2014   Procedure: COLONOSCOPY;  Surgeon: Daneil Dolin, MD;  [facility];  Service: Endoscopy;  Laterality: N/A;  815  . CORONARY STENT INTERVENTION N/A 07/30/2016   Procedure: Coronary Stent Intervention;  Surgeon: [name], MD;  Location: [place] [facility];  Service: Cardiovascular;  Laterality: N/A;  . CORONARY STENT PLACEMENT  2000   By Dr. Olevia Perches  . EP IMPLANTABLE DEVICE N/A 05/25/2016   Procedure: Loop Recorder Insertion;  Surgeon: [name], MD;  Location: [place] [facility];  Service: Cardiovascular;  Laterality: N/A;  . ESOPHAGOGASTRODUODENOSCOPY     esophagus stretched remotely at [facility]  . ESOPHAGOGASTRODUODENOSCOPY N/A 10/01/2014   Procedure: ESOPHAGOGASTRODUODENOSCOPY (EGD);  Surgeon: Daneil Dolin, MD;  Location: [facility];  Service: Endoscopy;  Laterality: N/A;  . HERNIA REPAIR  5400   umbilical  . LEFT HEART CATH AND CORONARY ANGIOGRAPHY N/A 07/30/2016   Procedure: Left Heart Cath and Coronary Angiography;  Surgeon: [name], MD;  Location: [place] [facility];  Service: Cardiovascular;  Laterality: N/A;  . LESION REMOVAL     Lip and hand   . NECK SURGERY  Social History   Social History  . Marital status: Married    Spouse name: N/A  . Number of children: 2  . Years of education: N/A   Occupational History  . retired    Social History Main Topics  . Smoking status: Former Smoker    Packs/day: 0.25    Years: 51.00    Types: Cigarettes  . Smokeless tobacco: Never Used     Comment: smokes  a pack a week  . Alcohol use No  . Drug use: No  . Sexual activity: Yes   Other Topics Concern  . Not on file   Social History Narrative   Lives with his wife.  Retired.  Unable to afford medicines.       Review of Systems: General: negative for chills, fever, night sweats or weight changes.  Cardiovascular: negative for chest pain, dyspnea on exertion, edema, orthopnea, palpitations, paroxysmal nocturnal dyspnea or shortness of breath Dermatological: negative for rash Respiratory: negative for cough or wheezing Urologic: negative for hematuria Abdominal: negative for nausea, vomiting, diarrhea, bright red blood per rectum, melena, or hematemesis Neurologic: negative for visual changes, syncope, or dizziness All other systems reviewed and are otherwise negative except as noted above.   Physical Exam:  Blood pressure 126/78, pulse 76, height 6\' 2"  (1.88 m), weight 285 lb 6.4 oz (129.5 kg).  General appearance: alert,  cooperative and no distress Neck: no carotid bruit and no JVD Lungs: clear to auscultation bilaterally Heart: regular rate and rhythm, S1, S2 normal, no murmur, click, rub or gallop Extremities: extremities normal, atraumatic, no cyanosis or edema Pulses: 2+ and symmetric Skin: Skin color, texture, turgor normal. No rashes or lesions Neurologic: Grossly normal  EKG not performed   [facility] 07/30/16  Procedures   Coronary Stent Intervention  Left Heart Cath and Coronary Angiography  Conclusion   1. Mild nonobstructive LAD stenosis with severe branch vessel disease involving the first and second diagonals 2. Total occlusion of the right coronary artery within the previously implanted stent from 2010 with TIMI 2 antegrade flow and brisk collateral filling the PDA from the septal perforators of the LAD 3. Severe complex disease in the left circumflex extending into the first obtuse marginal branch, treated successfully with overlapping drug-eluting stents 4. Normal LV function by echo assessment    ASSESSMENT AND PLAN:   1. CAD: s/p NSTEMI. Cath findings outlined above, he underwent PCI + DES x 2 to LCx extending to the OM1. EF normal. No recurrent angina. No dyspnea. Continue Plavix. No ASA given he is also on Eliquis. Continue BB and Imdur. Retry low dose Crestor, 5 mg, every other day.   2. PAF: RRR on physical exam. Rate is controlled. Continue Eliquis for a/c  3. HTN: controlled on current regimen   4. HLD: LDL elevated at 137 mg/dL. Goal LDL is < 70 mg/dL. He has a h/o statin intolerance but wants to retry Crestor 5 mg every other day. We will repeat FLP and HFTs in 6 weeks. If LDL is not at goal, we will place referral to lipid clinic for PCSK-9 inhibitor therapy.   PLAN  F/u with Dr. Harl Bowie or APP in 8 weeks  [name] [facility] PA-C 08/13/2016 4:30 PM

## 2016-08-18 ENCOUNTER — Other Ambulatory Visit: Payer: Self-pay | Admitting: Cardiology

## 2016-08-21 ENCOUNTER — Telehealth: Payer: Self-pay | Admitting: Internal Medicine

## 2016-08-21 ENCOUNTER — Telehealth: Payer: Self-pay | Admitting: Family

## 2016-08-21 NOTE — Telephone Encounter (Signed)
Patient was switched from omeprazole 40 mg to pantoprazole 40 mg in the hospital by Dr. Gwenlyn Found. Patient stated that this does not work and he wants to go back on the omeprazole. Also patient stated he could not take his Crestor, but was told to call the office before he stopped it completely. Consulted DOD Dr. Saunders Revel, he recommend patient to stay on Protonix, that omeprazole is not recommended to take with Plavix. Dr. Saunders Revel recommend patient follow-up with his PCP for further GERD symptoms. Called patient with recommendations and he verbalized understanding. Will send message to Dr. Harl Bowie to address patient's question about Crestor.

## 2016-08-21 NOTE — Telephone Encounter (Addendum)
New message      Pt c/o medication issue:  1. Name of Medication:  omeprazole 2. How are you currently taking this medication (dosage and times per day)?  40mg  3. Are you having a reaction (difficulty breathing--STAT)?  no 4. What is your medication issue?  Pt states that we stopped his omeprazole while in the hospital.  He was discharged on 07-31-16.  Now his acid reflux is really bad.  Can re restart this medication?  Please advise

## 2016-08-24 ENCOUNTER — Ambulatory Visit: Payer: Self-pay | Admitting: Family

## 2016-08-24 ENCOUNTER — Ambulatory Visit (INDEPENDENT_AMBULATORY_CARE_PROVIDER_SITE_OTHER): Payer: Medicare Other | Admitting: *Deleted

## 2016-08-24 ENCOUNTER — Other Ambulatory Visit: Payer: Self-pay | Admitting: Cardiology

## 2016-08-24 DIAGNOSIS — I639 Cerebral infarction, unspecified: Secondary | ICD-10-CM

## 2016-08-24 NOTE — Telephone Encounter (Signed)
Ok to go back to omeprazole 40mg  daily. The interaction between omeprazole and plavix is debated, with the majority of evidence suggesting there is not significant interaction between the 2 and it is safe. I'm ok going back to omperazole. Alda Berthold can we change him back.    Zandra Abts MD

## 2016-08-24 NOTE — Progress Notes (Signed)
Carelink Summary Report / Loop Recorder 

## 2016-08-24 NOTE — Telephone Encounter (Signed)
Tried to call - no answer / LMOVM

## 2016-08-26 ENCOUNTER — Ambulatory Visit (INDEPENDENT_AMBULATORY_CARE_PROVIDER_SITE_OTHER): Payer: Medicare Other | Admitting: Family

## 2016-08-26 ENCOUNTER — Encounter: Payer: Self-pay | Admitting: Family

## 2016-08-26 VITALS — BP 134/82 | HR 76 | Temp 97.0°F | Ht 74.0 in | Wt 278.2 lb

## 2016-08-26 DIAGNOSIS — Z794 Long term (current) use of insulin: Secondary | ICD-10-CM

## 2016-08-26 DIAGNOSIS — Z9861 Coronary angioplasty status: Secondary | ICD-10-CM

## 2016-08-26 DIAGNOSIS — F411 Generalized anxiety disorder: Secondary | ICD-10-CM | POA: Diagnosis not present

## 2016-08-26 DIAGNOSIS — E1169 Type 2 diabetes mellitus with other specified complication: Secondary | ICD-10-CM | POA: Diagnosis not present

## 2016-08-26 DIAGNOSIS — Z114 Encounter for screening for human immunodeficiency virus [HIV]: Secondary | ICD-10-CM

## 2016-08-26 DIAGNOSIS — E785 Hyperlipidemia, unspecified: Secondary | ICD-10-CM

## 2016-08-26 DIAGNOSIS — I2510 Atherosclerotic heart disease of native coronary artery without angina pectoris: Secondary | ICD-10-CM | POA: Diagnosis not present

## 2016-08-26 DIAGNOSIS — I10 Essential (primary) hypertension: Secondary | ICD-10-CM

## 2016-08-26 DIAGNOSIS — E1142 Type 2 diabetes mellitus with diabetic polyneuropathy: Secondary | ICD-10-CM | POA: Diagnosis not present

## 2016-08-26 DIAGNOSIS — K219 Gastro-esophageal reflux disease without esophagitis: Secondary | ICD-10-CM | POA: Diagnosis not present

## 2016-08-26 DIAGNOSIS — I252 Old myocardial infarction: Secondary | ICD-10-CM

## 2016-08-26 DIAGNOSIS — F331 Major depressive disorder, recurrent, moderate: Secondary | ICD-10-CM

## 2016-08-26 DIAGNOSIS — Z1159 Encounter for screening for other viral diseases: Secondary | ICD-10-CM

## 2016-08-26 DIAGNOSIS — K5900 Constipation, unspecified: Secondary | ICD-10-CM

## 2016-08-26 LAB — BAYER DCA HB A1C WAIVED: HB A1C (BAYER DCA - WAIVED): 6.8 % (ref <7.0)

## 2016-08-26 MED ORDER — DEXLANSOPRAZOLE 60 MG PO CPDR: 60.0000 mg | DELAYED_RELEASE_CAPSULE | Freq: Every day | ORAL | 1 refills | Status: DC

## 2016-08-26 MED ORDER — LINACLOTIDE 72 MCG PO CAPS: 72.0000 ug | ORAL_CAPSULE | Freq: Every day | ORAL | 3 refills | Status: DC

## 2016-08-26 MED ORDER — OMEPRAZOLE 40 MG PO CPDR: 40.0000 mg | DELAYED_RELEASE_CAPSULE | Freq: Every day | ORAL | 3 refills | Status: DC

## 2016-08-26 NOTE — Progress Notes (Signed)
Subjective:    Patient ID: [name] [name], male    DOB: 08/15/1950, 66 y.o.   MRN: [id number]  PT presents to the office today for chronic follow up. PT had NSTEMI on 07/30/16 and stent placed. Pt is followed by Cardiologists.  Diabetes  He presents for his follow-up diabetic visit. He has type 2 diabetes mellitus. His disease course has been worsening. Hypoglycemia symptoms include nervousness/anxiousness. Associated symptoms include blurred vision and foot paresthesias. Pertinent negatives for diabetes include no foot ulcerations and no visual change. There are no hypoglycemic complications. Symptoms are improving. Diabetic complications include a CVA, heart disease and peripheral neuropathy. Pertinent negatives for diabetic complications include no nephropathy. Risk factors for coronary artery disease include hypertension, obesity, male sex, sedentary lifestyle, stress, family history, dyslipidemia and diabetes mellitus. He is compliant with treatment all of the time. He is following a generally unhealthy diet. His breakfast blood glucose range is generally 140-180 mg/dl. An ACE inhibitor/angiotensin II receptor blocker is being taken. Eye exam is current.  Hyperlipidemia  This is a chronic problem. The current episode started more than 1 year ago. The problem is uncontrolled. Recent lipid tests were reviewed and are high. Exacerbating diseases include obesity. Current antihyperlipidemic treatment includes diet change. The current treatment provides no improvement of lipids. Risk factors for coronary artery disease include a sedentary lifestyle, obesity, male sex, hypertension, family history, dyslipidemia and diabetes mellitus.  Depression       The patient presents with depression.  This is a chronic problem.  The current episode started more than 1 year ago.   The onset quality is gradual.   The problem occurs intermittently.  The problem has been waxing and waning since onset.  Associated symptoms  include hopelessness and irritable.  Associated symptoms include no helplessness, no restlessness and not sad.  Past treatments include SSRIs - Selective serotonin reuptake inhibitors.  Past medical history includes anxiety and depression.   Anxiety  Presents for follow-up visit. Symptoms include depressed mood, excessive worry and nervous/anxious behavior. Patient reports no restlessness. Symptoms occur occasionally.   His past medical history is significant for depression.  Gastroesophageal Reflux  He complains of heartburn. He reports no belching or no coughing. This is a chronic problem. The current episode started more than 1 year ago. The problem occurs frequently. The problem has been waxing and waning. The heartburn is of mild intensity. The symptoms are aggravated by certain foods. Risk factors include obesity. He has tried a PPI and a diet change for the symptoms. The treatment provided moderate relief.  Constipation  This is a chronic problem. The current episode started more than 1 year ago. The problem has been waxing and waning since onset. His stool frequency is 4 to 5 times per week. He has tried diet changes and laxatives for the symptoms. The treatment provided moderate relief.  Diabetic Neuropathy  Pt has burning and numbness has improved to a 5 out 10. PT taking gabapentin 300 mg TID.    Review of Systems  Eyes: Positive for blurred vision.  Respiratory: Negative for cough.   Gastrointestinal: Positive for constipation and heartburn.  Psychiatric/Behavioral: Positive for depression. The patient is nervous/anxious.   All other systems reviewed and are negative.      Objective:   Physical Exam  Constitutional: He is oriented to person, place, and time. He appears well-developed and well-nourished. He is irritable. No distress.  Morbid obese  HENT:  Head: Normocephalic.  Right Ear: External ear normal.  Left Ear: External ear normal.  Nose: Nose normal.  Mouth/Throat:  Oropharynx is clear and moist.  Eyes: Pupils are equal, round, and reactive to light. Right eye exhibits no discharge. Left eye exhibits no discharge.  Neck: Normal range of motion. Neck supple. No thyromegaly present.  Cardiovascular: Normal rate, regular rhythm, normal heart sounds and intact distal pulses.   No murmur heard. Pulmonary/Chest: Effort normal and breath sounds normal. No respiratory distress. He has no wheezes.  Abdominal: Soft. Bowel sounds are normal. He exhibits no distension. There is no tenderness.  Musculoskeletal: Normal range of motion. He exhibits no edema or tenderness.  Neurological: He is alert and oriented to person, place, and time.  Skin: Skin is warm and dry. No rash noted. No erythema.  Psychiatric: He has a normal mood and affect. His behavior is normal. Judgment and thought content normal.  Vitals reviewed.     BP 134/82   Pulse 76   Temp 97 F (36.1 C) (Oral)   Ht _0  (1.88 m)   Wt 278 lb 3.2 oz (126.2 kg)   BMI 35.72 kg/m      Assessment & Plan:  1. Essential hypertension, benign - CMP14+EGFR  2. CAD S/P percutaneous coronary angioplasty - CMP14+EGFR  3. Gastroesophageal reflux disease without esophagitis -Dexilant changed from Protonix  -Diet discussed- Avoid fried, spicy, citrus foods, caffeine and alcohol -Do not eat 2-3 hours before bedtime -Encouraged small frequent meals -Avoid NSAID's - CMP14+EGFR - dexlansoprazole (DEXILANT) 60 MG capsule; Take 1 capsule (60 mg total) by mouth daily.  Dispense: 90 capsule; Refill: 1  4. Hyperlipidemia associated with type 2 diabetes mellitus (HCC) - CMP14+EGFR - Lipid panel  5. Diabetic peripheral neuropathy associated with type 2 diabetes mellitus (HCC) - CMP14+EGFR - Bayer DCA Hb A1c Waived  6. Diabetic polyneuropathy associated with type 2 diabetes mellitus (HCC - CMP14+EGFR - Bayer DCA Hb A1c Waived  7. Morbid obesity ([place]) - CMP14+EGFR  8. Moderate episode of recurrent major  depressive disorder (HCC) - CMP14+EGFR  9. GAD (generalized anxiety disorder) - CMP14+EGFR  10. History of non-ST elevation myocardial infarction (NSTEMI) - CMP14+EGFR  11. Type 2 diabetes mellitus with diabetic polyneuropathy, with long-term current use of insulin (HCC) - CMP14+EGFR - Bayer DCA Hb A1c Waived  12. Encounter for screening for HIV - HIV antibody  13. Need for hepatitis C screening test - Hepatitis C antibody  14. Constipation, unspecified constipation type -Linzess samples given    Continue all meds Labs pending Health Maintenance reviewed Diet and exercise encouraged RTO 3 months and follow up with Tammy to discuss [place], FNP

## 2016-08-26 NOTE — Patient Instructions (Signed)
Diabetes Mellitus and Food It is important for you to manage your blood sugar (glucose) level. Your blood glucose level can be greatly affected by what you eat. Eating healthier foods in the appropriate amounts throughout the day at about the same time each day will help you control your blood glucose level. It can also help slow or prevent worsening of your diabetes mellitus. Healthy eating may even help you improve the level of your blood pressure and reach or maintain a healthy weight. General recommendations for healthful eating and cooking habits include:  Eating meals and snacks regularly. Avoid going long periods of time without eating to lose weight.  Eating a diet that consists mainly of plant-based foods, such as fruits, vegetables, nuts, legumes, and whole grains.  Using low-heat cooking methods, such as baking, instead of high-heat cooking methods, such as deep frying.  Work with your dietitian to make sure you understand how to use the Nutrition Facts information on food labels. How can food affect me? Carbohydrates Carbohydrates affect your blood glucose level more than any other type of food. Your dietitian will help you determine how many carbohydrates to eat at each meal and teach you how to count carbohydrates. Counting carbohydrates is important to keep your blood glucose at a healthy level, especially if you are using insulin or taking certain medicines for diabetes mellitus. Alcohol Alcohol can cause sudden decreases in blood glucose (hypoglycemia), especially if you use insulin or take certain medicines for diabetes mellitus. Hypoglycemia can be a life-threatening condition. Symptoms of hypoglycemia (sleepiness, dizziness, and disorientation) are similar to symptoms of having too much alcohol. If your health care provider has given you approval to drink alcohol, do so in moderation and use the following guidelines:  Women should not have more than one drink per day, and men  should not have more than two drinks per day. One drink is equal to: ? 12 oz of beer. ? 5 oz of wine. ? 1 oz of hard liquor.  Do not drink on an empty stomach.  Keep yourself hydrated. Have water, diet soda, or unsweetened iced tea.  Regular soda, juice, and other mixers might contain a lot of carbohydrates and should be counted.  What foods are not recommended? As you make food choices, it is important to remember that all foods are not the same. Some foods have fewer nutrients per serving than other foods, even though they might have the same number of calories or carbohydrates. It is difficult to get your body what it needs when you eat foods with fewer nutrients. Examples of foods that you should avoid that are high in calories and carbohydrates but low in nutrients include:  Trans fats (most processed foods list trans fats on the Nutrition Facts label).  Regular soda.  Juice.  Candy.  Sweets, such as cake, pie, doughnuts, and cookies.  Fried foods.  What foods can I eat? Eat nutrient-rich foods, which will nourish your body and keep you healthy. The food you should eat also will depend on several factors, including:  The calories you need.  The medicines you take.  Your weight.  Your blood glucose level.  Your blood pressure level.  Your cholesterol level.  You should eat a variety of foods, including:  Protein. ? Lean cuts of meat. ? Proteins low in saturated fats, such as fish, egg whites, and beans. Avoid processed meats.  Fruits and vegetables. ? Fruits and vegetables that may help control blood glucose levels, such as apples,   mangoes, and yams.  Dairy products. ? Choose fat-free or low-fat dairy products, such as milk, yogurt, and cheese.  Grains, bread, pasta, and rice. ? Choose whole grain products, such as multigrain bread, whole oats, and brown rice. These foods may help control blood pressure.  Fats. ? Foods containing healthful fats, such as  nuts, avocado, olive oil, canola oil, and fish.  Does everyone with diabetes mellitus have the same meal plan? Because every person with diabetes mellitus is different, there is not one meal plan that works for everyone. It is very important that you meet with a dietitian who will help you create a meal plan that is just right for you. This information is not intended to replace advice given to you by your health care provider. Make sure you discuss any questions you have with your health care provider. Document Released: 01/29/2005 Document Revised: 10/10/2015 Document Reviewed: 03/31/2013 Elsevier Interactive Patient Education  2017 [employer]  

## 2016-08-26 NOTE — Telephone Encounter (Signed)
Called patient. No answer. Left message to call back.  

## 2016-08-27 ENCOUNTER — Encounter: Payer: Self-pay | Admitting: Pharmacist

## 2016-08-27 ENCOUNTER — Encounter: Payer: Medicare Other | Admitting: Internal Medicine

## 2016-08-27 ENCOUNTER — Ambulatory Visit (INDEPENDENT_AMBULATORY_CARE_PROVIDER_SITE_OTHER): Payer: Medicare Other | Admitting: Pharmacist

## 2016-08-27 VITALS — BP 150/88 | HR 84 | Ht 74.0 in | Wt 280.0 lb

## 2016-08-27 DIAGNOSIS — Z79899 Other long term (current) drug therapy: Secondary | ICD-10-CM

## 2016-08-27 DIAGNOSIS — E1169 Type 2 diabetes mellitus with other specified complication: Secondary | ICD-10-CM

## 2016-08-27 DIAGNOSIS — E785 Hyperlipidemia, unspecified: Secondary | ICD-10-CM

## 2016-08-27 DIAGNOSIS — Z794 Long term (current) use of insulin: Secondary | ICD-10-CM | POA: Diagnosis not present

## 2016-08-27 DIAGNOSIS — E1142 Type 2 diabetes mellitus with diabetic polyneuropathy: Secondary | ICD-10-CM | POA: Diagnosis not present

## 2016-08-27 LAB — CMP14+EGFR
ALT: 22 IU/L (ref 0–44)
AST: 18 IU/L (ref 0–40)
Albumin/Globulin Ratio: 1.7 (ref 1.2–2.2)
Albumin: 4.5 g/dL (ref 3.6–4.8)
Alkaline Phosphatase: 82 IU/L (ref 39–117)
BUN/Creatinine Ratio: 18 (ref 10–24)
BUN: 22 mg/dL (ref 8–27)
Bilirubin Total: 0.4 mg/dL (ref 0.0–1.2)
CO2: 26 mmol/L (ref 18–29)
Calcium: 10.5 mg/dL — ABNORMAL HIGH (ref 8.6–10.2)
Chloride: 99 mmol/L (ref 96–106)
Creatinine, Ser: 1.21 mg/dL (ref 0.76–1.27)
GFR calc Af Amer: 72 mL/min/{1.73_m2} (ref >59)
GFR calc non Af Amer: 62 mL/min/{1.73_m2} (ref >59)
Globulin, Total: 2.6 g/dL (ref 1.5–4.5)
Glucose: 145 mg/dL — ABNORMAL HIGH (ref 65–99)
Potassium: 4.6 mmol/L (ref 3.5–5.2)
Sodium: 140 mmol/L (ref 134–144)
Total Protein: 7.1 g/dL (ref 6.0–8.5)

## 2016-08-27 LAB — LIPID PANEL
Chol/HDL Ratio: 6.9 ratio — ABNORMAL HIGH (ref 0.0–5.0)
Cholesterol, Total: 200 mg/dL — ABNORMAL HIGH (ref 100–199)
HDL: 29 mg/dL — ABNORMAL LOW (ref >39)
LDL Calculated: 122 mg/dL — ABNORMAL HIGH (ref 0–99)
Triglycerides: 244 mg/dL — ABNORMAL HIGH (ref 0–149)
VLDL Cholesterol Cal: 49 mg/dL — ABNORMAL HIGH (ref 5–40)

## 2016-08-27 LAB — HIV ANTIBODY (ROUTINE TESTING W REFLEX): HIV Screen 4th Generation wRfx: NONREACTIVE

## 2016-08-27 LAB — HEPATITIS C ANTIBODY: Hep C Virus Ab: 0.2 s/co ratio (ref 0.0–0.9)

## 2016-08-27 NOTE — Progress Notes (Signed)
Patient ID: [name] [name], male   DOB: 1950/06/22, 66 y.o.   MRN: [id number]   Subjective:    [name] [name] is a 66 y.o. male who is referred by his PCP to discuss PCSK9 therapy.  In addition patient have a few questions about recent medications changes.   He has tried several statins in the past.  Most recnet was rosuvastatin 5mg  QOD (took Co Enzyme Q10 also) and stopped after 1 week due to severe myalgias - could not turn neck / head.  Has also tried lipitor, livalo and pravastatin with severe myalgias with all of these.  He has also taken fenofibrate and has body aches.    He current is taking OTC Red Yeast Rice and can tolerate - although he is aware that it contains naturally occurring substances that are like statins.  [name] [name] was hospitalized in March 2018 after having his third MI.  Overlapping drug eluding stents were placed. Patient was place back on Eliquis for Afib and also Plavix for stent (he had stopped Plavix due to gum bleeding and bruising).  Patient wants to know why he is taking two medications that thin his blood.   Diabetes is currently controlled and A1c was 6.8% yesterday - 07/26/16.  patient is taking Trulicity 1.5mg  SQ weekly and Relion Mix 70/30 80 units bid.  He also injects short acting insulin as needed for elevated BG based on equation BG - 120 /15 but has not needed in last 3 weeks. BG ranges from 150 to 180  Known diabetic complications: peripheral neuropathy and cardiovascular disease Cardiovascular risk factors: advanced age (older than 45 for men, 86 for women), diabetes mellitus, dyslipidemia, hypertension, male gender, obesity (BMI >= 30 kg/m2), sedentary lifestyle and smoking/ tobacco exposure  Current diet: in general diet is improving  He has been following low CHO diet that was given to him about 1 year ago by his chiropractor.   Current exercise: none Current monitoring regimen:  Checks 2-3 times daily.  Ranges from 110 to 175 -  Patient reported   Is He  on ACE inhibitor or angiotensin II receptor blocker?  Yes telmisartan 40mg  qd - was suppose to hold until followed up with cardiologist but he restarted on own and stopped amlodipine  The following portions of the patient's history were reviewed and updated as appropriate: allergies, current medications, past family history, past medical history, past social history, past surgical history and problem list.  Objective:    BP (!) 150/88   Pulse 84   Ht 6\' 2"  (1.88 m)   Wt 280 lb (127 kg)   BMI 35.95 kg/m   A1c = 6.8% (07/2016) A1c = 10.0% (05/21/2016) A1c = 7.7% (02/19/2016) A1c = 6.5% (09/2015)  Lab Review Glucose (mg/dL)  Date Value  08/26/2016 145 (H)  07/09/2016 188 (H)  05/26/2016 275 (H)   Glucose, Bld (mg/dL)  Date Value  07/31/2016 131 (H)  07/30/2016 165 (H)  05/25/2016 254 (H)   CO2 (mmol/L)  Date Value  08/26/2016 26  07/31/2016 24  07/30/2016 23   BUN (mg/dL)  Date Value  08/26/2016 22  07/31/2016 14  07/30/2016 19  07/09/2016 23  05/26/2016 34 (H)   Creatinine, Ser (mg/dL)  Date Value  08/26/2016 1.21  07/31/2016 0.96  07/30/2016 0.98     Assessment:    Diabetes Mellitus type II, currently controlled  HTN - elevated today but was at goal earlier this week Hyperlipidemia - statin intolerant - patient is concerned  about cost of Repatha but is open to checking on insurance coverage and possibility of assistance with cost.  Medication Management  Plan:    1.  Rx changes:   Continue Relion Mix 70/30 - injection 100 unit BID  Continue Trulicity to 1.5mg  SQ weekly  Will do insurance verification for Repatha. 2.  Continue to check BG 3 times daily 3.  Discussed that plavix is for stents and Eliquis for preventing stroke due to atrial fib.  Patient understands that both are recommended.  Advised of s/s of bleeding to monitor for. 4.   Follow up:  3 months when AWV due - sooner if patient has questions  Cherre Robins, PharmD, CPP, CDE

## 2016-08-28 NOTE — Telephone Encounter (Signed)
Pt.notified

## 2016-08-30 ENCOUNTER — Other Ambulatory Visit: Payer: Self-pay | Admitting: Family

## 2016-09-02 DIAGNOSIS — H2513 Age-related nuclear cataract, bilateral: Secondary | ICD-10-CM | POA: Diagnosis not present

## 2016-09-02 DIAGNOSIS — Z794 Long term (current) use of insulin: Secondary | ICD-10-CM | POA: Diagnosis not present

## 2016-09-02 DIAGNOSIS — E119 Type 2 diabetes mellitus without complications: Secondary | ICD-10-CM | POA: Diagnosis not present

## 2016-09-04 LAB — CUP PACEART REMOTE DEVICE CHECK
Date Time Interrogation Session: 20180420181808
Implantable Pulse Generator Implant Date: 20180108

## 2016-09-22 ENCOUNTER — Ambulatory Visit (INDEPENDENT_AMBULATORY_CARE_PROVIDER_SITE_OTHER): Payer: Medicare Other | Admitting: *Deleted

## 2016-09-22 DIAGNOSIS — I639 Cerebral infarction, unspecified: Secondary | ICD-10-CM

## 2016-09-22 NOTE — Progress Notes (Signed)
Carelink Summary Report / Loop Recorder 

## 2016-09-28 ENCOUNTER — Encounter: Payer: Self-pay | Admitting: Pediatrics

## 2016-09-28 ENCOUNTER — Ambulatory Visit (INDEPENDENT_AMBULATORY_CARE_PROVIDER_SITE_OTHER): Payer: Medicare Other | Admitting: Pediatrics

## 2016-09-28 VITALS — BP 130/74 | HR 70 | Temp 98.1°F | Ht 74.0 in | Wt 287.6 lb

## 2016-09-28 DIAGNOSIS — M170 Bilateral primary osteoarthritis of knee: Secondary | ICD-10-CM

## 2016-09-28 NOTE — Progress Notes (Signed)
  Subjective:   Patient ID: [name] [name], male    DOB: [birth year], 66 y.o.   MRN: [id number] CC: Knee Pain (Bilateral, Right worse)  HPI: [name] [name] is a 66 y.o. male presenting for Knee Pain (Bilateral, Right worse)  R knee bothers him more than L Sometimes gives out, rarely happens Was told he has cartilage problems in 2006 Hasnt been seen by ortho for knee since then Had DES for CAD in 07/2016 for NSTEMI, now on plavix, on eliquis for paroxysmal afib Was getting back injections for lower back pain, not able to get them any longer due to anticoagulation with eliquis per pt  R knee xray 02/2016 with medial joint space narrowing  No recent CP, SOB  Relevant past medical, surgical, family and social history reviewed. Allergies and medications reviewed and updated. History  Smoking Status  . Former Smoker  . Packs/day: 0.25  . Years: 51.00  . Types: Cigarettes  . Quit date: 08/14/2016  Smokeless Tobacco  . Never Used    Comment: smokes  a pack a week   ROS: Per HPI   Objective:    BP 130/74   Pulse 70   Temp 98.1 F (36.7 C) (Oral)   Ht 6\' 2"  (1.88 m)   Wt 287 lb 9.6 oz (130.5 kg)   BMI 36.93 kg/m   Wt Readings from Last 3 Encounters:  09/28/16 287 lb 9.6 oz (130.5 kg)  08/27/16 280 lb (127 kg)  08/26/16 278 lb 3.2 oz (126.2 kg)    Gen: NAD, alert, cooperative with exam, NCAT EYES: EOMI, no conjunctival injection, or no icterus CV: NRRR, normal S1/S2, no murmur, distal pulses 2+ b/l Resp: CTABL, no wheezes, normal WOB Ext: No edema, warm Neuro: Alert and oriented, strength equal b/l UE and LE, coordination grossly normal MSK: nl to inspection b/l, no redness  no effusion present b/l R knee patella easily mobile, no tenderness along joint line  Assessment & Plan:  [name] was seen today for knee pain.  Diagnoses and all orders for this visit:  Osteoarthritis of both knees, unspecified osteoarthritis type -     Ambulatory referral to Orthopedic  Surgery  CAD Recent DES, on plavix. Pt interested in knee replacement at some point, would need cardiology clearance. Will likely need to wait some months.  Follow up plan: As scheduled with PCP Assunta Found, MD [place]

## 2016-10-01 ENCOUNTER — Ambulatory Visit (INDEPENDENT_AMBULATORY_CARE_PROVIDER_SITE_OTHER): Payer: Medicare Other | Admitting: Adult Health

## 2016-10-01 ENCOUNTER — Encounter: Payer: Self-pay | Admitting: Adult Health

## 2016-10-01 VITALS — BP 144/72 | HR 85 | Ht 74.5 in | Wt 281.0 lb

## 2016-10-01 DIAGNOSIS — I10 Essential (primary) hypertension: Secondary | ICD-10-CM

## 2016-10-01 DIAGNOSIS — I2510 Atherosclerotic heart disease of native coronary artery without angina pectoris: Secondary | ICD-10-CM | POA: Diagnosis not present

## 2016-10-01 DIAGNOSIS — Z9861 Coronary angioplasty status: Secondary | ICD-10-CM

## 2016-10-01 DIAGNOSIS — Z789 Other specified health status: Secondary | ICD-10-CM | POA: Diagnosis not present

## 2016-10-01 NOTE — Patient Instructions (Signed)
Your physician recommends that you schedule a follow-up appointment in: 3 Months with Dr. Harl Bowie  Your physician recommends that you continue on your current medications as directed. Please refer to the Current Medication list given to you today.  You have been referred to Cardiac Rehab  You have been referred to the [facility]   If you need a refill on your cardiac medications before your next appointment, please call your pharmacy.  Thank you for choosing [facility]!

## 2016-10-01 NOTE — Progress Notes (Signed)
Cardiology Office Note   Date:  10/01/2016   ID:  [name] [name], DOB January 27, 1951, MRN [id number]  PCP:  [name], FNP  Cardiologist: Cloria Spring, NP   No chief complaint on file.     History of Present Illness: [name] [name] is a 66 y.o. male who presents for ongoing assessment and management of CAD, (s/p DES to RCA, which was chronically occluded, 2010, HTN, HLD, Type II DM, and hx of CVA. He was recently diagnosed with PAF and placed on Eliquis. He was recently admitted to [facility] for complaints of chest pain on 07/29/2016, with elevated troponin. He underwent cardiac cath on 07/30/2016  Conclusion   1. Mild nonobstructive LAD stenosis with severe branch vessel disease involving the first and second diagonals 2. Total occlusion of the right coronary artery within the previously implanted stent from 2010 with TIMI 2 antegrade flow and brisk collateral filling the PDA from the septal perforators of the LAD 3. Severe complex disease in the left circumflex extending into the first obtuse marginal branch, treated successfully with overlapping drug-eluting stents 4. Normal LV function by echo assessment  Recommendations:  Resume anticoagulation with Eliquis tomorrow for PAF  Plavix 75 mg daily (loaded with 600 mg in cath lab)  No ASA (has had gingival bleeding and don't think he will tolerate triple anticoagulant therapy)  Consider PCI of the RCA if refractory angina. However, with collateral filling of the PDA and need for chronic anticoagulation, concern over bleeding risk, may be best for medical therapy   He was treated with two overlapping DES to the left Cx.extending into the the OM1. He was started back on Eliquis and restarted on Plavix. As he was experiencing gingival bleeding, he was not started on ASA. He was intolerant to statins. Consideration for referral to lipid clinic will be made on this office visit. He was referred to out patient cardiac rehab.    He is here today without further complaints of chest pain. He no longer has any bleeding in his gums, He has heard from cardiac rehab, and will keep the appointment. He has no issues with any of the medications. He continues to have heart burn symptoms but does not wish to see GI again.   Past Medical History:  Diagnosis Date  . AODM   . Arthritis   . CAD (coronary artery disease)    a. 2010: DES to CTO of RCA. EF 55% c. 07/2016: cath showing total occlusion within previously placed RCA stent (collaterals present), severe stenosis along LCx and OM1 (treated with 2 overlapping DES).   . Cellulitis and abscess rt groin   . CHEST PAIN-UNSPECIFIED   . Diabetes mellitus   . Disorders of iron metabolism   . DISORDERS OF IRON METABOLISM   . GERD (gastroesophageal reflux disease)   . Hyperlipidemia   . Hypertension   . Low serum testosterone level   . Medically noncompliant   . MURMUR     Past Surgical History:  Procedure Laterality Date  . BACK SURGERY  2015   ACDF by Dr. [name]  . COLONOSCOPY N/A 10/01/2014   Procedure: COLONOSCOPY;  Surgeon: Daneil Dolin, MD;  [facility];  Service: Endoscopy;  Laterality: N/A;  815  . CORONARY STENT INTERVENTION N/A 07/30/2016   Procedure: Coronary Stent Intervention;  Surgeon: [name], MD;  Location: [facility];  Service: Cardiovascular;  Laterality: N/A;  . CORONARY STENT PLACEMENT  2000   By  Dr. Olevia Perches  . EP IMPLANTABLE DEVICE N/A 05/25/2016   Procedure: Loop Recorder Insertion;  Surgeon: [name], MD;  Location: [facility];  Service: Cardiovascular;  Laterality: N/A;  . ESOPHAGOGASTRODUODENOSCOPY     esophagus stretched remotely at [facility]  . ESOPHAGOGASTRODUODENOSCOPY N/A 10/01/2014   Procedure: ESOPHAGOGASTRODUODENOSCOPY (EGD);  Surgeon: Daneil Dolin, MD;  Location: [facility];  Service: Endoscopy;  Laterality: N/A;  . HERNIA REPAIR  7425   umbilical  . LEFT HEART CATH AND CORONARY ANGIOGRAPHY N/A  07/30/2016   Procedure: Left Heart Cath and Coronary Angiography;  Surgeon: [name], MD;  Location: [facility];  Service: Cardiovascular;  Laterality: N/A;  . LESION REMOVAL     Lip and hand   . NECK SURGERY       Current Outpatient Prescriptions  Medication Sig Dispense Refill  . ALPRAZolam (XANAX) 0.5 MG tablet TAKE ONE TABLET BY MOUTH TWICE DAILY AS NEEDED 60 tablet 3  . apixaban (ELIQUIS) 5 MG TABS tablet Take 1 tablet (5 mg total) by mouth 2 (two) times daily. 60 tablet 6  . clopidogrel (PLAVIX) 75 MG tablet Take 1 tablet (75 mg total) by mouth daily with breakfast. 30 tablet 11  . dexlansoprazole (DEXILANT) 60 MG capsule Take 1 capsule (60 mg total) by mouth daily. 90 capsule 1  . Dulaglutide (TRULICITY) 1.5 ZD/6.3OV SOPN Inject 1.5 mg into the skin as directed. Every friday    . furosemide (LASIX) 20 MG tablet Take 20 mg by mouth daily.    Marland Kitchen gabapentin (NEURONTIN) 300 MG capsule Take 1 capsule (300 mg total) by mouth 3 (three) times daily. 270 capsule 4  . HYDROcodone-acetaminophen (NORCO) 10-325 MG tablet Take 1-2 tablets by mouth every 8 (eight) hours as needed for moderate pain.     Marland Kitchen insulin NPH-regular Human (NOVOLIN 70/30) (70-30) 100 UNIT/ML injection Inject 80 Units into the skin 2 (two) times daily with a meal.    . isosorbide mononitrate (IMDUR) 30 MG 24 hr tablet Take 30 mg by mouth daily.     . Lactobacillus (PROBIOTIC ACIDOPHILUS PO) Take 1 capsule by mouth daily.    Marland Kitchen linaclotide (LINZESS) 72 MCG capsule Take 1 capsule (72 mcg total) by mouth daily before breakfast. 30 capsule 3  . metoprolol tartrate (LOPRESSOR) 25 MG tablet Take 0.5 tablets (12.5 mg total) by mouth 2 (two) times daily. 90 tablet 4  . mometasone (NASONEX) 50 MCG/ACT nasal spray Place 2 sprays into the nose as needed.    . nitroGLYCERIN (NITROSTAT) 0.4 MG SL tablet Place 1 tablet (0.4 mg total) under the tongue every 5 (five) minutes x 3 doses as needed for chest pain. 25 tablet 2  .  NOVOLIN 70/30 RELION (70-30) 100 UNIT/ML injection INJECT 80 UNITS SUBCUTANEOUSLY TWICE DAILY WITH MEALS 50 mL 3  . Red Yeast Rice 600 MG TABS Take 600 mg by mouth 2 (two) times daily.     . sertraline (ZOLOFT) 50 MG tablet Take 1 tablet (50 mg total) by mouth daily. 30 tablet 0  . telmisartan (MICARDIS) 40 MG tablet TAKE ONE TABLET BY MOUTH ONCE DAILY (REPLACING  LISINOPRIL) 90 tablet 4   No current facility-administered medications for this visit.     Allergies:   Shellfish allergy; Ace inhibitors; Invokana [canagliflozin]; Metformin and related; Pravastatin sodium; Fenofibrate; Iodine; Lisinopril; Livalo [pitavastatin]; Crestor [rosuvastatin]; Horse-derived products; Lexapro [escitalopram oxalate]; and Lipitor [atorvastatin]    Social History:  The patient  reports that he quit smoking about  6 weeks ago. His smoking use included Cigarettes. He has a 12.75 pack-year smoking history. He has never used smokeless tobacco. He reports that he does not drink alcohol or use drugs.   Family History:  The patient's family history includes Alzheimer's disease in his mother; Arthritis in his father and mother; Arthritis/Rheumatoid in his sister; Dementia in his maternal aunt and maternal uncle; Diabetes in his father and sister; Heart disease in his father and maternal uncle; Hyperlipidemia in his mother and sister; Hypertension in his mother and sister; Lung cancer in his mother; Stomach cancer in his paternal uncle; Stroke in his father; Valvular heart disease in his father.    ROS: All other systems are reviewed and negative. Unless otherwise mentioned in H&P    PHYSICAL EXAM: VS:  BP (!) 144/72   Pulse 85   Ht 6' 2.5" (1.892 m)   Wt 281 lb (127.5 kg)   SpO2 96%   BMI 35.60 kg/m  , BMI Body mass index is 35.6 kg/m. GEN: Well nourished, well developed, in no acute distress  HEENT: normal  Neck: no JVD, carotid bruits, or masses Cardiac: RRR; no murmurs, rubs, or gallops,no edema    Respiratory:  clear to auscultation bilaterally, normal work of breathing GI: soft, nontender, nondistended, + BS Obese. MS: no deformity or atrophy  Skin: warm and dry, no rash Multiple cuts on his hands from yard work. No bleeding or bruising from cath site.  Neuro:  Strength and sensation are intact Psych: euthymic mood, full affect   Recent Labs: 05/21/2016: TSH 1.575 07/31/2016: Hemoglobin 15.2; Platelets 202 08/26/2016: ALT 22; BUN 22; Creatinine, Ser 1.21; Potassium 4.6; Sodium 140    Lipid Panel    Component Value Date/Time   CHOL 200 (H) 08/26/2016 0924   TRIG 244 (H) 08/26/2016 0924   HDL 29 (L) 08/26/2016 0924   CHOLHDL 6.9 (H) 08/26/2016 0924   CHOLHDL 8.5 07/31/2016 0258   VLDL 51 (H) 07/31/2016 0258   LDLCALC 122 (H) 08/26/2016 0924   LDLDIRECT 172 (H) 11/08/2014 0908      Wt Readings from Last 3 Encounters:  10/01/16 281 lb (127.5 kg)  09/28/16 287 lb 9.6 oz (130.5 kg)  08/27/16 280 lb (127 kg)     ASSESSMENT AND PLAN:  1. CAD: S/P NSTEMI with DES placed to the Cx. He is now on Plavix only without ASA, as he is on Eliquis for PAF. He is tolerating the medications without bleeding issues today. He still fells a little fatigued. He is willing to go to cardia rehab to gain strength and stamina.  Continue metoprolol and ARB.   2, Hypercholesterolemia: He is willing to be seen in the Lipid clinic in [place]. He has failed two separate trials of statin therapy. He is now on  Red Yeast rice. Appointment will be made.   3. Hypertension: Currently not optimal. May need to increase Micardis on next visit if remains elevated. Should be 125/60 preferably.for patient with CAD and diabetes. Will not adjust at this time, until next appointment shows persistent elevations.   4. Diabetes: To be followed by PCP  5. GERD: I have advised that he see Gi as he continues to experience heart burn. He is on two blood thinners and I do not want to risk ulcer or other sequela from  these medications. He is willing to think about going back.   5. PAF: Currently in regular rhythm. Will continue Eliquis for now.       Current  medicines are reviewed at length with the patient today.    Labs/ tests ordered today include:    Phill Myron. [place] Pugh, ANP, AACC   10/01/2016 1:45 PM    [facility] [address] [place], [place] [address] Phone: [phone]; Fax: [phone]

## 2016-10-02 LAB — CUP PACEART REMOTE DEVICE CHECK
Date Time Interrogation Session: 20180508153759
Implantable Pulse Generator Implant Date: 20180108

## 2016-10-04 ENCOUNTER — Other Ambulatory Visit: Payer: Self-pay | Admitting: Family Medicine

## 2016-10-08 DIAGNOSIS — M47812 Spondylosis without myelopathy or radiculopathy, cervical region: Secondary | ICD-10-CM | POA: Diagnosis not present

## 2016-10-08 DIAGNOSIS — M9901 Segmental and somatic dysfunction of cervical region: Secondary | ICD-10-CM | POA: Diagnosis not present

## 2016-10-08 DIAGNOSIS — M9902 Segmental and somatic dysfunction of thoracic region: Secondary | ICD-10-CM | POA: Diagnosis not present

## 2016-10-08 DIAGNOSIS — M9903 Segmental and somatic dysfunction of lumbar region: Secondary | ICD-10-CM | POA: Diagnosis not present

## 2016-10-08 DIAGNOSIS — M546 Pain in thoracic spine: Secondary | ICD-10-CM | POA: Diagnosis not present

## 2016-10-11 ENCOUNTER — Other Ambulatory Visit: Payer: Self-pay | Admitting: Family

## 2016-10-13 ENCOUNTER — Other Ambulatory Visit: Payer: Self-pay | Admitting: *Deleted

## 2016-10-13 DIAGNOSIS — M546 Pain in thoracic spine: Secondary | ICD-10-CM | POA: Diagnosis not present

## 2016-10-13 DIAGNOSIS — M47812 Spondylosis without myelopathy or radiculopathy, cervical region: Secondary | ICD-10-CM | POA: Diagnosis not present

## 2016-10-13 DIAGNOSIS — M9902 Segmental and somatic dysfunction of thoracic region: Secondary | ICD-10-CM | POA: Diagnosis not present

## 2016-10-13 DIAGNOSIS — M9903 Segmental and somatic dysfunction of lumbar region: Secondary | ICD-10-CM | POA: Diagnosis not present

## 2016-10-13 DIAGNOSIS — M9901 Segmental and somatic dysfunction of cervical region: Secondary | ICD-10-CM | POA: Diagnosis not present

## 2016-10-13 NOTE — Progress Notes (Deleted)
Patient ID: [name] [name]                 DOB: 17-Feb-1951                    MRN: [id number]     HPI: [name] [name] is a 66 y.o. male patient of Dr. Forde Dandy referred to lipid clinic by Lyda Jester, PA-C. Pt has PMH of CAD (s/p DES to CTO of RCA in 2010), HTN, HLD, Type 2 DM, prior CVA, and PAF (on Eliquis). Pt saw Lyda Jester for follow-up on 08/13/16 after hospitalization for NSTEMI. Pt has extensive history of statin intolerance to at least 4 statins at a variety of different doses, zetia, gemfibrozil, and fenofibrate. During that visit patient was retired on atorvastatin 5mg  every other day and also took Co Enzyme Q10. Pt stopped this after 1 week due to severe myalgias where he could not turn neck/head. Pt presents for further lipid management and to discuss the options of PCSK9i therapy.    Current Medications: Red Yeast rice 600mg  BID Intolerances: pravastatin 40mg  - myalgia, fenofibrate and gemfibrozil - body aches, pitavastatin 2mg  daily - myalgia, simvastatin 10mg  - body aches, rosuvastatin - myalgia, atorvastatin 20mg  daily - myalgia, zetia Risk Factors: CAD (s/p DES to CTO of RCA in 2010, DES to left Cx and OM1 on 07/2016), DM, CVA LDL goal: < 70 mg/dL  Diet:   Exercise:   Family History: The patient's family history includes Alzheimer's disease in his mother; Dementia in his maternal aunt and maternal uncle; Diabetes in his father and sister; Heart disease in his father and maternal uncle; Hyperlipidemia in his mother and sister; Hypertension in his mother and sister; Stroke in his father; Valvular heart disease in his father.   Social History: The patient reports that he quit smoking about 6 weeks ago. His smoking use included Cigarettes. He has a 12.75 pack-year smoking history. He has never used smokeless tobacco. He reports that he does not drink alcohol or use drugs.  Labs: 08/26/2016: TC 200, TG 244, HDL 29, LDL-D 172 (red yeast rice) 07/31/2016: TG 255, HDL 25, LDL  137 02/19/2016: TC 239, TG 647, HDL 28 09/17/2015: TC 210, TG 260, HDL 24, LDL 134  Past Medical History:  Diagnosis Date  . AODM   . Arthritis   . CAD (coronary artery disease)    a. 2010: DES to CTO of RCA. EF 55% c. 07/2016: cath showing total occlusion within previously placed RCA stent (collaterals present), severe stenosis along LCx and OM1 (treated with 2 overlapping DES).   . Cellulitis and abscess rt groin   . CHEST PAIN-UNSPECIFIED   . Diabetes mellitus   . Disorders of iron metabolism   . DISORDERS OF IRON METABOLISM   . GERD (gastroesophageal reflux disease)   . Hyperlipidemia   . Hypertension   . Low serum testosterone level   . Medically noncompliant   . MURMUR     Current Outpatient Prescriptions on File Prior to Visit  Medication Sig Dispense Refill  . ALPRAZolam (XANAX) 0.5 MG tablet TAKE ONE TABLET BY MOUTH TWICE DAILY AS NEEDED 60 tablet 3  . apixaban (ELIQUIS) 5 MG TABS tablet Take 1 tablet (5 mg total) by mouth 2 (two) times daily. 60 tablet 6  . clopidogrel (PLAVIX) 75 MG tablet Take 1 tablet (75 mg total) by mouth daily with breakfast. 30 tablet 11  . dexlansoprazole (DEXILANT) 60 MG capsule Take 1 capsule (60 mg total) by  mouth daily. 90 capsule 1  . Dulaglutide (TRULICITY) 1.5 UD/1.4HF SOPN Inject 1.5 mg into the skin as directed. Every friday    . furosemide (LASIX) 20 MG tablet Take 20 mg by mouth daily.    Marland Kitchen gabapentin (NEURONTIN) 300 MG capsule TAKE ONE CAPSULE BY MOUTH THREE TIMES DAILY 270 capsule 0  . HYDROcodone-acetaminophen (NORCO) 10-325 MG tablet Take 1-2 tablets by mouth every 8 (eight) hours as needed for moderate pain.     Marland Kitchen insulin NPH-regular Human (NOVOLIN 70/30) (70-30) 100 UNIT/ML injection Inject 80 Units into the skin 2 (two) times daily with a meal.    . isosorbide mononitrate (IMDUR) 30 MG 24 hr tablet Take 30 mg by mouth daily.     . Lactobacillus (PROBIOTIC ACIDOPHILUS PO) Take 1 capsule by mouth daily.    Marland Kitchen linaclotide (LINZESS) 72  MCG capsule Take 1 capsule (72 mcg total) by mouth daily before breakfast. 30 capsule 3  . metoprolol tartrate (LOPRESSOR) 25 MG tablet Take 0.5 tablets (12.5 mg total) by mouth 2 (two) times daily. 90 tablet 4  . mometasone (NASONEX) 50 MCG/ACT nasal spray Place 2 sprays into the nose as needed.    . nitroGLYCERIN (NITROSTAT) 0.4 MG SL tablet Place 1 tablet (0.4 mg total) under the tongue every 5 (five) minutes x 3 doses as needed for chest pain. 25 tablet 2  . NOVOLIN 70/30 RELION (70-30) 100 UNIT/ML injection INJECT 80 UNITS SUBCUTANEOUSLY TWICE DAILY WITH MEALS 50 mL 3  . Red Yeast Rice 600 MG TABS Take 600 mg by mouth 2 (two) times daily.     . sertraline (ZOLOFT) 50 MG tablet TAKE 1 TABLET BY MOUTH ONCE DAILY 30 tablet 1  . telmisartan (MICARDIS) 40 MG tablet TAKE ONE TABLET BY MOUTH ONCE DAILY (REPLACING LISINORIL) 90 tablet 4   No current facility-administered medications on file prior to visit.     Allergies  Allergen Reactions  . Shellfish Allergy Anaphylaxis and Other (See Comments)    Tongue swelling, hives  . Ace Inhibitors Other (See Comments) and Cough    CKD, renal failure   . Invokana [Canagliflozin] Other (See Comments)    Syncope / dehydration  . Metformin And Related Itching  . Pravastatin Sodium Other (See Comments)    myalgias  . Fenofibrate Other (See Comments)    Body aches  . Iodine Other (See Comments)    ????  . Lisinopril Cough  . Livalo [Pitavastatin] Other (See Comments)    Myalgias   . Crestor [Rosuvastatin] Other (See Comments)    Myalgias   . Horse-Derived Products Rash  . Lexapro [Escitalopram Oxalate] Other (See Comments)    Buzzing in ears,headache, felt like a zombie  . Lipitor [Atorvastatin] Other (See Comments)    myalgias    Assessment/Plan:  1. Hyperlipidemia -   Melburn Popper, PharmD, [facility] PGY2 Pharmacy Resident Pager: [place]. [place], PharmD, [facility] [address], [place],  [place] [address] Phone: [phone]; Fax: [phone]

## 2016-10-14 ENCOUNTER — Ambulatory Visit: Payer: Medicare Other

## 2016-10-18 ENCOUNTER — Other Ambulatory Visit: Payer: Self-pay | Admitting: Family Medicine

## 2016-10-22 ENCOUNTER — Ambulatory Visit (INDEPENDENT_AMBULATORY_CARE_PROVIDER_SITE_OTHER): Payer: Medicare Other | Admitting: *Deleted

## 2016-10-22 DIAGNOSIS — I639 Cerebral infarction, unspecified: Secondary | ICD-10-CM

## 2016-10-23 NOTE — Progress Notes (Signed)
Carelink Summary Report / Loop Recorder 

## 2016-10-25 ENCOUNTER — Other Ambulatory Visit: Payer: Self-pay | Admitting: Family

## 2016-10-26 ENCOUNTER — Encounter (HOSPITAL_COMMUNITY): Payer: Medicare Other

## 2016-10-26 ENCOUNTER — Ambulatory Visit (INDEPENDENT_AMBULATORY_CARE_PROVIDER_SITE_OTHER): Payer: Medicare Other | Admitting: Pharmacist

## 2016-10-26 ENCOUNTER — Ambulatory Visit (INDEPENDENT_AMBULATORY_CARE_PROVIDER_SITE_OTHER): Payer: Medicare Other | Admitting: Orthopedic Surgery

## 2016-10-26 ENCOUNTER — Encounter (INDEPENDENT_AMBULATORY_CARE_PROVIDER_SITE_OTHER): Payer: Self-pay | Admitting: Orthopedic Surgery

## 2016-10-26 VITALS — BP 116/76 | HR 80 | Ht 74.0 in | Wt 287.8 lb

## 2016-10-26 DIAGNOSIS — E785 Hyperlipidemia, unspecified: Secondary | ICD-10-CM | POA: Diagnosis not present

## 2016-10-26 DIAGNOSIS — M25562 Pain in left knee: Secondary | ICD-10-CM

## 2016-10-26 DIAGNOSIS — M25561 Pain in right knee: Secondary | ICD-10-CM | POA: Diagnosis not present

## 2016-10-26 DIAGNOSIS — E1169 Type 2 diabetes mellitus with other specified complication: Secondary | ICD-10-CM | POA: Diagnosis not present

## 2016-10-26 DIAGNOSIS — G8929 Other chronic pain: Secondary | ICD-10-CM

## 2016-10-26 NOTE — Telephone Encounter (Signed)
Phoned in.

## 2016-10-26 NOTE — Patient Instructions (Addendum)
Will start paperwork for Repatha and will let you know of approval status.   [facility]  This is our direct phone line for the clinic: [phone] Please let us know when the above foundation has contacted you.

## 2016-10-26 NOTE — Progress Notes (Signed)
Patient ID: [name] [name]                 DOB: [birth year]                    MRN: [id number]     HPI: [name] [name] is a 66 y.o. male who is a patient of Dr. Harl Bowie, referred to lipid clinic by Lyda Jester, PA. PMH is significant for CAD (s/p DES to CTO of RCA in 2010, DESx2 to LCx extending to [place] 07/2016), HTN, HLD, Type 2 DM, prior CVA, and recently diagnosed PAF (on Eliquis). He was recently hospitalized on 07/29/16 for NSTEMI with normal LVEF. At his post-hospitalization follow-up visit on 08/13/16, his LDL was noted to be elevated at 137 mg/dL. He has a h/o statin intolerance, but was trialed on Crestor 5mg  every other day. Repeat lipid panel on 08/26/16 showed LDL of 122 mg/dL, unsure if patient self-discontinued Crestor (see 08/21/16 telephone note).   Patient presents in good spirits with his wife today without assistance for Lipid Clinic establishment. He was unable to tolerate Crestor 5mg  every other day, and reported that he stopped taking it about a week ago. Myalgias had developed after 1 week of therapy and improved after statin discontinuation. This is similar to his myalgias he experienced on previous statins as well (full list of intolerances below). He reports muscle aches/pain with joint pain that was severe enough to prevent him from walking to the mailbox and he was unable to turn a fishing reel. He reports that he self-restarted fenofibrate 160mg  every other day, and is experiencing some muscle pain with it however is tolerating it ok overall. He does have some back pain at baseline.  Of note, he reports an improvement in his diet since getting out of the hospital. Focusing on eating more [place] and chicken.   Current Medications:  Fenofibrate 160mg  every other day - patient self-restarted Red Yeast Rice 600 mg bid   Intolerances: Crestor 5mg  qod, Zetia 10mg  daily, Fenofibrate 160mg  daily, Gemfibrozil 600mg  bid, Livalo 2g daily, Pravastatin 40mg  daily, Atorvastatin 40mg  daily -  muscle aches/pain, joint pain, unable to walk  Risk Factors: CAD, DM, HLD, HTN  LDL goal: < 70 mg/dL  Diet: Has focused on improving diet. Eats a lot of [place] and chicken.   Family History: Father (DM, heart disease, stroke), Mother (HLD, HTN, lung cancer), Sister (DM, HTN, HLD)   Social History: Former smoker, denies alcohol and drug use.   Labs: 07/2016 - TG 255, HDL 25, LDL 137 (on Red Yeast Rice, fenofibrate 160mg  every other day) 08/2016 - TC 200, TG 244, HDL 29, LDL 122 (on Red Yeast Rice)  Past Medical History:  Diagnosis Date  . AODM   . Arthritis   . CAD (coronary artery disease)    a. 2010: DES to CTO of RCA. EF 55% c. 07/2016: cath showing total occlusion within previously placed RCA stent (collaterals present), severe stenosis along LCx and OM1 (treated with 2 overlapping DES).   . Cellulitis and abscess rt groin   . CHEST PAIN-UNSPECIFIED   . Diabetes mellitus   . Disorders of iron metabolism   . DISORDERS OF IRON METABOLISM   . GERD (gastroesophageal reflux disease)   . Hyperlipidemia   . Hypertension   . Low serum testosterone level   . Medically noncompliant   . MURMUR     Current Outpatient Prescriptions on File Prior to Visit  Medication Sig Dispense Refill  .  ALPRAZolam (XANAX) 0.5 MG tablet TAKE ONE TABLET BY MOUTH TWICE DAILY AS NEEDED 60 tablet 5  . apixaban (ELIQUIS) 5 MG TABS tablet Take 1 tablet (5 mg total) by mouth 2 (two) times daily. 60 tablet 6  . clopidogrel (PLAVIX) 75 MG tablet Take 1 tablet (75 mg total) by mouth daily with breakfast. 30 tablet 11  . dexlansoprazole (DEXILANT) 60 MG capsule Take 1 capsule (60 mg total) by mouth daily. 90 capsule 1  . Dulaglutide (TRULICITY) 1.5 ZO/1.0RU SOPN Inject 1.5 mg into the skin as directed. Every friday    . furosemide (LASIX) 20 MG tablet Take 20 mg by mouth daily.    Marland Kitchen gabapentin (NEURONTIN) 300 MG capsule TAKE ONE CAPSULE BY MOUTH THREE TIMES DAILY 270 capsule 0  . HYDROcodone-acetaminophen  (NORCO) 10-325 MG tablet Take 1-2 tablets by mouth every 8 (eight) hours as needed for moderate pain.     Marland Kitchen insulin NPH-regular Human (NOVOLIN 70/30) (70-30) 100 UNIT/ML injection Inject 80 Units into the skin 2 (two) times daily with a meal.    . isosorbide mononitrate (IMDUR) 30 MG 24 hr tablet Take 30 mg by mouth daily.     . isosorbide mononitrate (IMDUR) 30 MG 24 hr tablet TAKE ONE TABLET BY MOUTH ONCE DAILY 90 tablet 0  . Lactobacillus (PROBIOTIC ACIDOPHILUS PO) Take 1 capsule by mouth daily.    Marland Kitchen linaclotide (LINZESS) 72 MCG capsule Take 1 capsule (72 mcg total) by mouth daily before breakfast. 30 capsule 3  . metoprolol tartrate (LOPRESSOR) 25 MG tablet TAKE ONE-HALF TABLET BY MOUTH TWICE DAILY 90 tablet 4  . mometasone (NASONEX) 50 MCG/ACT nasal spray Place 2 sprays into the nose as needed.    . nitroGLYCERIN (NITROSTAT) 0.4 MG SL tablet Place 1 tablet (0.4 mg total) under the tongue every 5 (five) minutes x 3 doses as needed for chest pain. 25 tablet 2  . NOVOLIN 70/30 RELION (70-30) 100 UNIT/ML injection INJECT 80 UNITS SUBCUTANEOUSLY TWICE DAILY WITH MEALS 50 mL 3  . Red Yeast Rice 600 MG TABS Take 600 mg by mouth 2 (two) times daily.     . sertraline (ZOLOFT) 50 MG tablet TAKE 1 TABLET BY MOUTH ONCE DAILY 30 tablet 1  . telmisartan (MICARDIS) 40 MG tablet TAKE ONE TABLET BY MOUTH ONCE DAILY (REPLACING LISINORIL) 90 tablet 4   No current facility-administered medications on file prior to visit.     Allergies  Allergen Reactions  . Shellfish Allergy Anaphylaxis and Other (See Comments)    Tongue swelling, hives  . Ace Inhibitors Other (See Comments) and Cough    CKD, renal failure   . Invokana [Canagliflozin] Other (See Comments)    Syncope / dehydration  . Metformin And Related Itching  . Pravastatin Sodium Other (See Comments)    myalgias  . Fenofibrate Other (See Comments)    Body aches  . Iodine Other (See Comments)    ????  . Lisinopril Cough  . Livalo [Pitavastatin]  Other (See Comments)    Myalgias   . Crestor [Rosuvastatin] Other (See Comments)    Myalgias   . Horse-Derived Products Rash  . Lexapro [Escitalopram Oxalate] Other (See Comments)    Buzzing in ears,headache, felt like a zombie  . Lipitor [Atorvastatin] Other (See Comments)    myalgias    Assessment/Plan:  1. Hyperlipidemia - LDL currently 122mg /dL above goal of < 70 mg/dL with multiple statin intolerances to 4 different statins, including low dose Crestor 5mg  every other day and Zetia.  PCSK9i are the only option to bring LDL to goal. Discussed expected benefits, side effects, and injection technique. Will start paperwork for [place]. Follow-up with Lipid Clinic once approval status is known.   Patient signed informed consent for [facility] lipid registry.   Belia Heman, PharmD PGY1 Resident 10/26/2016 12:03 PM  Patient seen with: Fuller Canada, PharmD, CPP, [facility] [address], [place], [place] [address] Phone: [phone]; Fax: [phone]

## 2016-10-27 ENCOUNTER — Other Ambulatory Visit: Payer: Self-pay | Admitting: Family Medicine

## 2016-10-28 ENCOUNTER — Encounter: Payer: Self-pay | Admitting: *Deleted

## 2016-10-28 DIAGNOSIS — Z006 Encounter for examination for normal comparison and control in clinical research program: Secondary | ICD-10-CM

## 2016-10-28 NOTE — Progress Notes (Signed)
Late entry: Subject met inclusion and exclusion criteria. The informed consent form, study requirements and expectations were reviewed with the subject and questions and concerns were addressed prior to the signing of the consent form. The subject verbalized understanding of the trail requirements. The subject agreed to participate in the [facility] Registryand signed the informed consent. The informed consent was obtained prior to performance of any protocol-specific procedures for the subject. A copy of the signed informed consent was given to the subject and a copy was placed in the subject's medical record.  Hugh Pruitt, RN 10/26/2016 

## 2016-10-28 NOTE — Progress Notes (Signed)
Office Visit Note   Patient: [name] [name]           Date of Birth: July 08, 1950           MRN: [id number] Visit Date: 10/26/2016 Requested by: Eustaquio Maize, [place], [place] [address] PCP: [name], FNP  Subjective: Chief Complaint  Patient presents with  . Right Knee - Pain  . Left Knee - Pain    HPI: [name] [name] is a 66 year old patient with bilateral knee pain.  Been ongoing for months but for the last 2-3 weeks it's become constant.  Reports a lot of aching.  Advised 15 years ago that he needed surgery which would consist of knee replacement.  He does report swelling weakness giving way popping and locking.  He was taking meloxicam but he had to discontinue that after his heart attack in March.  He has very limited walking endurance and the pain will wake him from sleep at night.  Hemoglobin A1c most recently 6.2.  He did have a mild heart attack in March 2018 and currently he is on Plavix and [name].  He has been receiving cortisone knee injections for the past 10 years but his primary care provider did not want to do that with him on his current medications.  He is also being treated by a physician for back pain with epidural steroid injections for lumbar spine issues.              ROS: All systems reviewed are negative as they relate to the chief complaint within the history of present illness.  Patient denies  fevers or chills.   Assessment & Plan: Visit Diagnoses:  1. Chronic pain of both knees     Plan: Impression is bilateral knee pain with outside radiographs reviewed.  He has pretty severe medial compartment arthritis in the face of tricompartmental osteoarthritis.  This is endstage arthritis particularly on the medial side in both knees.  His right is worse than the left.  I think the best plan at this time is to pre-approve bilateral Synvisc injections.  We will see him back for that injection once that's preapproved.  He can't really have any type of  surgical intervention until he is at least 6 months out from this "mild heart attack".  Follow-Up Instructions: Return in about 3 weeks (around 11/16/2016).   Orders:  No orders of the defined types were placed in this encounter.  No orders of the defined types were placed in this encounter.     Procedures: No procedures performed   Clinical Data: No additional findings.  Objective: Vital Signs: There were no vitals taken for this visit.  Physical Exam:   Constitutional: Patient appears well-developed HEENT:  Head: Normocephalic Eyes:EOM are normal Neck: Normal range of motion Cardiovascular: Normal rate Pulmonary/chest: Effort normal Neurologic: Patient is alert Skin: Skin is warm Psychiatric: Patient has normal mood and affect    Ortho Exam: Orthopedic exam demonstrates slight varus alignment he does have perfused feet with intact ankle dorsi flexion plantar flexion strength.  Mild pitting edema in the feet is noted.  He has about a 5 flexion contracture bilaterally with intact extensor mechanism and no groin pain with internal/external rotation of the leg.  Collateral and cruciate ligaments are stable in both knees.  Skin is intact in the knee region bilaterally.  He does have evidence of some abrasions on the legs which do appear to be very slow healing based  on his diabetes diagnosis  Specialty Comments:  No specialty comments available.  Imaging: No results found.   PMFS History: Patient Active Problem List   Diagnosis Date Noted  . History of non-ST elevation myocardial infarction (NSTEMI) 07/29/2016  . Diabetes ([facility]) 05/25/2016  . Occlusion and stenosis of vertebral artery   . Acute renal failure ([facility]) 05/23/2016  . Syncope 05/21/2016  . Diabetic neuropathy ([facility]) 01/13/2016  . Depression 11/01/2015  . Elevated white blood cell count 01/23/2015  . Erectile dysfunction 01/23/2015  . Morbid obesity ([facility]) 01/23/2015  . Periodontal disease 01/23/2015  .  Stopped smoking with greater than 30 pack year history 01/23/2015  . Diabetic peripheral neuropathy associated with type 2 diabetes mellitus ([facility]) 11/08/2014  . Mucosal abnormality of stomach   . History of colonic polyps   . GERD (gastroesophageal reflux disease) 09/06/2014  . Low serum testosterone level 08/01/2014  . DDD (degenerative disc disease), cervical 12/20/2013  . Headache 08/07/2013  . Medically noncompliant 10/25/2011  . CAD S/P percutaneous coronary angioplasty 10/25/2011  . MURMUR 04/16/2009  . Essential hypertension, benign 08/02/2008  . Hyperlipidemia associated with type 2 diabetes mellitus ([facility]) 07/31/2008  . DISORDERS OF IRON METABOLISM 07/31/2008   Past Medical History:  Diagnosis Date  . AODM   . Arthritis   . CAD (coronary artery disease)    a. 2010: DES to CTO of RCA. EF 55% c. 07/2016: cath showing total occlusion within previously placed RCA stent (collaterals present), severe stenosis along LCx and OM1 (treated with 2 overlapping DES).   . Cellulitis and abscess rt groin   . CHEST PAIN-UNSPECIFIED   . Diabetes mellitus   . Disorders of iron metabolism   . DISORDERS OF IRON METABOLISM   . GERD (gastroesophageal reflux disease)   . Hyperlipidemia   . Hypertension   . Low serum testosterone level   . Medically noncompliant   . MURMUR     Family History  Problem Relation Age of Onset  . Diabetes Father   . Valvular heart disease Father   . Arthritis Father   . Heart disease Father   . Stroke Father   . Alzheimer's disease Mother   . Hyperlipidemia Mother   . Hypertension Mother   . Arthritis Mother   . Lung cancer Mother   . Arthritis/Rheumatoid Sister   . Diabetes Sister   . Hypertension Sister   . Hyperlipidemia Sister   . Dementia Maternal Aunt   . Dementia Maternal Uncle   . Heart disease Maternal Uncle   . Stomach cancer Paternal Uncle   . Colon cancer Neg Hx   . Liver disease Neg Hx     Past Surgical History:  Procedure Laterality  Date  . BACK SURGERY  2015   ACDF by Dr. [name]  . COLONOSCOPY N/A 10/01/2014   Procedure: COLONOSCOPY;  Surgeon: Daneil Dolin, MD;  Location: [facility];  Service: Endoscopy;  Laterality: N/A;  815  . CORONARY STENT INTERVENTION N/A 07/30/2016   Procedure: Coronary Stent Intervention;  Surgeon: [name], MD;  Location: [facility];  Service: Cardiovascular;  Laterality: N/A;  . CORONARY STENT PLACEMENT  2000   By Dr. Olevia Perches  . EP IMPLANTABLE DEVICE N/A 05/25/2016   Procedure: Loop Recorder Insertion;  Surgeon: [name], MD;  Location: [facility];  Service: Cardiovascular;  Laterality: N/A;  . ESOPHAGOGASTRODUODENOSCOPY     esophagus stretched [facility] at [facility]  . ESOPHAGOGASTRODUODENOSCOPY N/A 10/01/2014   Procedure: ESOPHAGOGASTRODUODENOSCOPY (EGD);  Surgeon: Daneil Dolin, MD;  Location: [facility];  Service: Endoscopy;  Laterality: N/A;  . HERNIA REPAIR  0802   umbilical  . LEFT HEART CATH AND CORONARY ANGIOGRAPHY N/A 07/30/2016   Procedure: Left Heart Cath and Coronary Angiography;  Surgeon: [name], MD;  Location: [facility];  Service: Cardiovascular;  Laterality: N/A;  . LESION REMOVAL     Lip and hand   . NECK SURGERY     Social History   Occupational History  . retired    Social History Main Topics  . Smoking status: Former Smoker    Packs/day: 0.25    Years: 51.00    Types: Cigarettes    Quit date: 08/14/2016  . Smokeless tobacco: Never Used     Comment: smokes  a pack a week  . Alcohol use No  . Drug use: No  . Sexual activity: Yes

## 2016-10-29 ENCOUNTER — Telehealth (INDEPENDENT_AMBULATORY_CARE_PROVIDER_SITE_OTHER): Payer: Self-pay

## 2016-10-29 NOTE — Telephone Encounter (Signed)
IC patient about BV for synvisc one. Advised him that insurance would cover 80% after his copay Advised to call us back to schedule appt for bilat knee injections if this is something he would like to continue with.

## 2016-11-02 ENCOUNTER — Encounter (HOSPITAL_COMMUNITY): Payer: Medicare Other

## 2016-11-02 LAB — CUP PACEART REMOTE DEVICE CHECK
Date Time Interrogation Session: 20180607154035
Implantable Pulse Generator Implant Date: 20180108

## 2016-11-02 NOTE — Progress Notes (Signed)
Care[facility] summary report received. Battery status OK. Normal device function. No new symptom episodes, tachy episodes, brady, or pause episodes. 1 AF 0.5% +Eliquis. Monthly summary reports and ROV/PRN

## 2016-11-15 ENCOUNTER — Other Ambulatory Visit: Payer: Self-pay | Admitting: Family Medicine

## 2016-11-19 ENCOUNTER — Encounter (HOSPITAL_COMMUNITY): Payer: Medicare Other

## 2016-11-23 ENCOUNTER — Ambulatory Visit (INDEPENDENT_AMBULATORY_CARE_PROVIDER_SITE_OTHER): Payer: Medicare Other | Admitting: *Deleted

## 2016-11-23 DIAGNOSIS — I639 Cerebral infarction, unspecified: Secondary | ICD-10-CM

## 2016-11-23 NOTE — Progress Notes (Signed)
Carelink Summary Report / Loop Recorder 

## 2016-11-24 ENCOUNTER — Other Ambulatory Visit: Payer: Self-pay | Admitting: *Deleted

## 2016-11-26 ENCOUNTER — Ambulatory Visit (INDEPENDENT_AMBULATORY_CARE_PROVIDER_SITE_OTHER): Payer: Medicare Other | Admitting: Family

## 2016-11-26 ENCOUNTER — Encounter: Payer: Self-pay | Admitting: Family

## 2016-11-26 VITALS — BP 137/81 | HR 83 | Temp 96.9°F | Ht 74.0 in | Wt 286.2 lb

## 2016-11-26 DIAGNOSIS — K5900 Constipation, unspecified: Secondary | ICD-10-CM

## 2016-11-26 DIAGNOSIS — I252 Old myocardial infarction: Secondary | ICD-10-CM

## 2016-11-26 DIAGNOSIS — F419 Anxiety disorder, unspecified: Secondary | ICD-10-CM | POA: Diagnosis not present

## 2016-11-26 DIAGNOSIS — K219 Gastro-esophageal reflux disease without esophagitis: Secondary | ICD-10-CM

## 2016-11-26 DIAGNOSIS — I10 Essential (primary) hypertension: Secondary | ICD-10-CM

## 2016-11-26 DIAGNOSIS — E1142 Type 2 diabetes mellitus with diabetic polyneuropathy: Secondary | ICD-10-CM

## 2016-11-26 DIAGNOSIS — F329 Major depressive disorder, single episode, unspecified: Secondary | ICD-10-CM

## 2016-11-26 DIAGNOSIS — Z794 Long term (current) use of insulin: Secondary | ICD-10-CM

## 2016-11-26 DIAGNOSIS — E1169 Type 2 diabetes mellitus with other specified complication: Secondary | ICD-10-CM | POA: Diagnosis not present

## 2016-11-26 DIAGNOSIS — E785 Hyperlipidemia, unspecified: Secondary | ICD-10-CM | POA: Diagnosis not present

## 2016-11-26 DIAGNOSIS — F32A Depression, unspecified: Secondary | ICD-10-CM

## 2016-11-26 LAB — BAYER DCA HB A1C WAIVED: HB A1C (BAYER DCA - WAIVED): 7.5 % — ABNORMAL HIGH (ref <7.0)

## 2016-11-26 MED ORDER — SERTRALINE HCL 100 MG PO TABS: 100.0000 mg | ORAL_TABLET | Freq: Every day | ORAL | 1 refills | Status: DC

## 2016-11-26 MED ORDER — ALPRAZOLAM 0.5 MG PO TABS: 0.5000 mg | ORAL_TABLET | Freq: Two times a day (BID) | ORAL | 3 refills | Status: DC | PRN

## 2016-11-26 NOTE — Patient Instructions (Signed)
Living With Depression Everyone experiences occasional disappointment, sadness, and loss in their lives. When you are feeling down, blue, or sad for at least 2 weeks in a row, it may mean that you have depression. Depression can affect your thoughts and feelings, relationships, daily activities, and physical health. It is caused by changes in the way your brain functions. If you receive a diagnosis of depression, your health care provider will tell you which type of depression you have and what treatment options are available to you. If you are living with depression, there are ways to help you recover from it and also ways to prevent it from coming back. How to cope with lifestyle changes Coping with stress Stress is your body's reaction to life changes and events, both good and bad. Stressful situations may include:  Getting married.  The death of a spouse.  Losing a job.  Retiring.  Having a baby.  Stress can last just a few hours or it can be ongoing. Stress can play a major role in depression, so it is important to learn both how to cope with stress and how to think about it differently. Talk with your health care provider or a counselor if you would like to learn more about stress reduction. He or she may suggest some stress reduction techniques, such as:  Music therapy. This can include creating music or listening to music. Choose music that you enjoy and that inspires you.  Mindfulness-based meditation. This kind of meditation can be done while sitting or walking. It involves being aware of your normal breaths, rather than trying to control your breathing.  Centering prayer. This is a kind of meditation that involves focusing on a spiritual word or phrase. Choose a word, phrase, or sacred image that is meaningful to you and that brings you peace.  Deep breathing. To do this, expand your stomach and inhale slowly through your nose. Hold your breath for 3-5 seconds, then exhale  slowly, allowing your stomach muscles to relax.  Muscle relaxation. This involves intentionally tensing muscles then relaxing them.  Choose a stress reduction technique that fits your lifestyle and personality. Stress reduction techniques take time and practice to develop. Set aside 5-15 minutes a day to do them. Therapists can offer training in these techniques. The training may be covered by some insurance plans. Other things you can do to manage stress include:  Keeping a stress diary. This can help you learn what triggers your stress and ways to control your response.  Understanding what your limits are and saying no to requests or events that lead to a schedule that is too full.  Thinking about how you respond to certain situations. You may not be able to control everything, but you can control how you react.  Adding humor to your life by watching funny films or TV shows.  Making time for activities that help you relax and not feeling guilty about spending your time this way.  Medicines Your health care provider may suggest certain medicines if he or she feels that they will help improve your condition. Avoid using alcohol and other substances that may prevent your medicines from working properly (may interact). It is also important to:  Talk with your pharmacist or health care provider about all the medicines that you take, their possible side effects, and what medicines are safe to take together.  Make it your goal to take part in all treatment decisions (shared decision-making). This includes giving input on the side   effects of medicines. It is best if shared decision-making with your health care provider is part of your total treatment plan.  If your health care provider prescribes a medicine, you may not notice the full benefits of it for 4-8 weeks. Most people who are treated for depression need to be on medicine for at least 6-12 months after they feel better. If you are taking  medicines as part of your treatment, do not stop taking medicines without first talking to your health care provider. You may need to have the medicine slowly decreased (tapered) over time to decrease the risk of harmful side effects. Relationships Your health care provider may suggest family therapy along with individual therapy and drug therapy. While there may not be family problems that are causing you to feel depressed, it is still important to make sure your family learns as much as they can about your mental health. Having your family's support can help make your treatment successful. How to recognize changes in your condition Everyone has a different response to treatment for depression. Recovery from major depression happens when you have not had signs of major depression for two months. This may mean that you will start to:  Have more interest in doing activities.  Feel less hopeless than you did 2 months ago.  Have more energy.  Overeat less often, or have better or improving appetite.  Have better concentration.  Your health care provider will work with you to decide the next steps in your recovery. It is also important to recognize when your condition is getting worse. Watch for these signs:  Having fatigue or low energy.  Eating too much or too little.  Sleeping too much or too little.  Feeling restless, agitated, or hopeless.  Having trouble concentrating or making decisions.  Having unexplained physical complaints.  Feeling irritable, angry, or aggressive.  Get help as soon as you or your family members notice these symptoms coming back. How to get support and help from others How to talk with friends and family members about your condition Talking to friends and family members about your condition can provide you with one way to get support and guidance. Reach out to trusted friends or family members, explain your symptoms to them, and let them know that you are  working with a health care provider to treat your depression. Financial resources Not all insurance plans cover mental health care, so it is important to check with your insurance carrier. If paying for co-pays or counseling services is a problem, search for a local or county mental health care center. They may be able to offer public mental health care services at low or no cost when you are not able to see a private health care provider. If you are taking medicine for depression, you may be able to get the generic form, which may be less expensive. Some makers of prescription medicines also offer help to patients who cannot afford the medicines they need. Follow these instructions at home:  Get the right amount and quality of sleep.  Cut down on using caffeine, tobacco, alcohol, and other potentially harmful substances.  Try to exercise, such as walking or lifting small weights.  Take over-the-counter and prescription medicines only as told by your health care provider.  Eat a healthy diet that includes plenty of vegetables, fruits, whole grains, low-fat dairy products, and lean protein. Do not eat a lot of foods that are high in solid fats, added sugars, or salt.    Keep all follow-up visits as told by your health care provider. This is important. Contact a health care provider if:  You stop taking your antidepressant medicines, and you have any of these symptoms: ? Nausea. ? Headache. ? Feeling lightheaded. ? Chills and body aches. ? Not being able to sleep (insomnia).  You or your friends and family think your depression is getting worse. Get help right away if:  You have thoughts of hurting yourself or others. If you ever feel like you may hurt yourself or others, or have thoughts about taking your own life, get help right away. You can go to your nearest emergency department or call:  Your local emergency services (911 in the [place]  A suicide crisis helpline, such as the  National Suicide Prevention Lifeline at 1-800-273-8255. This is open 24-hours a day.  Summary  If you are living with depression, there are ways to help you recover from it and also ways to prevent it from coming back.  Work with your health care team to create a management plan that includes counseling, stress management techniques, and healthy lifestyle habits. This information is not intended to replace advice given to you by your health care provider. Make sure you discuss any questions you have with your health care provider. Document Released: 04/06/2016 Document Revised: 04/06/2016 Document Reviewed: 04/06/2016 Elsevier Interactive Patient Education  2018 [employer]  

## 2016-11-26 NOTE — Progress Notes (Signed)
Subjective:    Patient ID: [name] [name], male    DOB: 03/28/1951, 66 y.o.   MRN: [id number]  PT presents to the office today for chronic follow up. PT had NSTEMI on 07/30/16 and stent placed. Pt is followed by Cardiologists.  Diabetes  He presents for his follow-up diabetic visit. He has type 2 diabetes mellitus. His disease course has been stable. Hypoglycemia symptoms include nervousness/anxiousness. Associated symptoms include blurred vision and foot paresthesias. Pertinent negatives for diabetes include no visual change. Symptoms are stable. Diabetic complications include heart disease and peripheral neuropathy. Pertinent negatives for diabetic complications include no CVA. Risk factors for coronary artery disease include diabetes mellitus, dyslipidemia, male sex, hypertension, family history and sedentary lifestyle. He is following a generally healthy diet. His breakfast blood glucose range is generally 130-140 mg/dl. Eye exam is current.  Hypertension  This is a chronic problem. The current episode started more than 1 year ago. The problem has been resolved since onset. The problem is controlled. Associated symptoms include anxiety, blurred vision, malaise/fatigue, peripheral edema and shortness of breath. Risk factors for coronary artery disease include dyslipidemia, diabetes mellitus, family history, obesity and sedentary lifestyle. The current treatment provides moderate improvement. Hypertensive end-organ damage includes CAD/MI. There is no history of CVA.  Depression         This is a chronic problem.  The current episode started more than 1 year ago.   The onset quality is gradual.   The problem occurs intermittently.  The problem has been waxing and waning since onset.  Associated symptoms include helplessness, hopelessness, irritable and sad.  Compliance with treatment is good.  Past medical history includes anxiety.   Anxiety  Presents for follow-up visit. Symptoms include irritability,  nervous/anxious behavior and shortness of breath. Patient reports no excessive worry. Symptoms occur most days.    Gastroesophageal Reflux  He reports no belching, no coughing or no heartburn. This is a chronic problem. The problem occurs occasionally. Risk factors include obesity. He has tried a PPI for the symptoms. The treatment provided moderate relief.  Constipation  This is a chronic problem. The current episode started more than 1 year ago. The problem has been waxing and waning since onset. His stool frequency is 1 time per day. He has tried laxatives for the symptoms. The treatment provided moderate relief.  Diabetic Neuropathy PT states he has intermittent pain and numbness in bilateral feet of 10 out 10. Pt taking gabapentin 300 mg TID.     Review of Systems  Constitutional: Positive for irritability and malaise/fatigue.  Eyes: Positive for blurred vision.  Respiratory: Positive for shortness of breath. Negative for cough.   Gastrointestinal: Positive for constipation. Negative for heartburn.  Psychiatric/Behavioral: Positive for depression. The patient is nervous/anxious.        Objective:   Physical Exam  Constitutional: He is oriented to person, place, and time. He appears well-developed and well-nourished. He is irritable. No distress.  HENT:  Head: Normocephalic.  Right Ear: External ear normal.  Left Ear: External ear normal.  Mouth/Throat: Oropharynx is clear and moist.  Eyes: Pupils are equal, round, and reactive to light. Right eye exhibits no discharge. Left eye exhibits no discharge.  Neck: Normal range of motion. Neck supple. No thyromegaly present.  Cardiovascular: Normal rate, regular rhythm, normal heart sounds and intact distal pulses.   No murmur heard. Pulmonary/Chest: Effort normal and breath sounds normal. No respiratory distress. He has no wheezes.  Abdominal: Soft. Bowel sounds are normal.  He exhibits no distension. There is no tenderness.    Musculoskeletal: Normal range of motion. He exhibits edema (trace in RLE). He exhibits no tenderness.  Neurological: He is alert and oriented to person, place, and time.  Skin: Skin is warm and dry. No rash noted. No erythema.  Psychiatric: He has a normal mood and affect. His behavior is normal. Judgment and thought content normal.  Vitals reviewed.     BP 137/81   Pulse 83   Temp (!) 96.9 F (36.1 C) (Oral)   Ht '6\' 2"'$  (1.88 m)   Wt 286 lb 3.2 oz (129.8 kg)   BMI 36.75 kg/m      Assessment & Plan:  1. Essential hypertension, benign - CMP14+EGFR  2. Gastroesophageal reflux disease without esophagitis - CMP14+EGFR  3. Hyperlipidemia associated with type 2 diabetes mellitus (HCC) - CMP14+EGFR - Lipid panel  4. Diabetic peripheral neuropathy associated with type 2 diabetes mellitus (HCC) - CMP14+EGFR - Microalbumin / creatinine urine ratio - Bayer DCA Hb A1c Waived  5. Diabetic polyneuropathy associated with type 2 diabetes mellitus (HCC) - CMP14+EGFR - Microalbumin / creatinine urine ratio - Bayer DCA Hb A1c Waived  6. Type 2 diabetes mellitus with diabetic polyneuropathy, with long-term current use of insulin (HCC) - CMP14+EGFR - Microalbumin / creatinine urine ratio - Bayer DCA Hb A1c Waived  7. Morbid obesity ([place]) - CMP14+EGFR  8. History of non-ST elevation myocardial infarction (NSTEMI) - CMP14+EGFR  9. Anxiety and depression Will increase zoloft to 100 mg, pt currently taking 25 mg. PT to start taking 50 mg for next few days then increase to 100 mg - CMP14+EGFR - ALPRAZolam (XANAX) 0.5 MG tablet; Take 1 tablet (0.5 mg total) by mouth 2 (two) times daily as needed.  Dispense: 60 tablet; Refill: 3 - sertraline (ZOLOFT) 100 MG tablet; Take 1 tablet (100 mg total) by mouth daily.  Dispense: 90 tablet; Refill: 1  10. Constipation, unspecified constipation type   Continue all meds Labs pending Health Maintenance reviewed Diet and exercise  encouraged RTO 3 months   [name], FNP

## 2016-11-26 NOTE — Telephone Encounter (Signed)
Rx called in 

## 2016-11-27 ENCOUNTER — Telehealth: Payer: Self-pay | Admitting: *Deleted

## 2016-11-27 LAB — CMP14+EGFR
ALT: 34 IU/L (ref 0–44)
AST: 30 IU/L (ref 0–40)
Albumin/Globulin Ratio: 1.6 (ref 1.2–2.2)
Albumin: 4.4 g/dL (ref 3.6–4.8)
Alkaline Phosphatase: 60 IU/L (ref 39–117)
BUN/Creatinine Ratio: 17 (ref 10–24)
BUN: 22 mg/dL (ref 8–27)
Bilirubin Total: 0.3 mg/dL (ref 0.0–1.2)
CO2: 26 mmol/L (ref 20–29)
Calcium: 10.4 mg/dL — ABNORMAL HIGH (ref 8.6–10.2)
Chloride: 99 mmol/L (ref 96–106)
Creatinine, Ser: 1.29 mg/dL — ABNORMAL HIGH (ref 0.76–1.27)
GFR calc Af Amer: 66 mL/min/{1.73_m2} (ref >59)
GFR calc non Af Amer: 57 mL/min/{1.73_m2} — ABNORMAL LOW (ref >59)
Globulin, Total: 2.8 g/dL (ref 1.5–4.5)
Glucose: 139 mg/dL — ABNORMAL HIGH (ref 65–99)
Potassium: 4.7 mmol/L (ref 3.5–5.2)
Sodium: 141 mmol/L (ref 134–144)
Total Protein: 7.2 g/dL (ref 6.0–8.5)

## 2016-11-27 LAB — LIPID PANEL
Chol/HDL Ratio: 7.8 ratio — ABNORMAL HIGH (ref 0.0–5.0)
Cholesterol, Total: 242 mg/dL — ABNORMAL HIGH (ref 100–199)
HDL: 31 mg/dL — ABNORMAL LOW (ref >39)
LDL Calculated: 157 mg/dL — ABNORMAL HIGH (ref 0–99)
Triglycerides: 271 mg/dL — ABNORMAL HIGH (ref 0–149)
VLDL Cholesterol Cal: 54 mg/dL — ABNORMAL HIGH (ref 5–40)

## 2016-11-27 LAB — MICROALBUMIN / CREATININE URINE RATIO
Creatinine, Urine: 133.6 mg/dL
Microalb/Creat Ratio: 131.2 mg/g creat — ABNORMAL HIGH (ref 0.0–30.0)
Microalbumin, Urine: 175.3 ug/mL

## 2016-11-27 MED ORDER — DULAGLUTIDE 1.5 MG/0.5ML ~~LOC~~ SOAJ: 1.5000 mg | SUBCUTANEOUS | 2 refills | Status: DC

## 2016-11-27 NOTE — Addendum Note (Signed)
Addended by: [name] A on: 11/27/2016 03:59 PM   Modules accepted: Orders

## 2016-11-27 NOTE — Telephone Encounter (Signed)
Fax refill request from pharmacy for trulicity TC to pt - he is still taking trulicity Labs from yesterday have not been reviewed yet Pt's mother recently passed away

## 2016-11-27 NOTE — Telephone Encounter (Signed)
Trulicity Prescription sent to pharmacy

## 2016-12-02 LAB — CUP PACEART REMOTE DEVICE CHECK
Date Time Interrogation Session: 20180707155322
Implantable Pulse Generator Implant Date: 20180108

## 2016-12-07 ENCOUNTER — Telehealth: Payer: Self-pay | Admitting: Pharmacist

## 2016-12-07 NOTE — Telephone Encounter (Signed)
Pt approved through [employer] for [place]. Advised to call to set up shipment and call us once has received first injection. He is comfortable doing injections at home as he is on several other injections. He states understanding and appreciation.

## 2016-12-14 ENCOUNTER — Telehealth: Payer: Self-pay | Admitting: Pharmacist

## 2016-12-14 DIAGNOSIS — E785 Hyperlipidemia, unspecified: Secondary | ICD-10-CM

## 2016-12-14 NOTE — Telephone Encounter (Signed)
Pt's wife called clinic stating that pt heard from [employer] and his first shipment of Repatha is scheduled for this Thursday, August 2nd. Pt is comfortable doing injections at home since he takes insulin as well. Advised pt to call clinic with any concerns, and scheduled f/u lab work for mid-September to assess efficacy.

## 2016-12-15 ENCOUNTER — Ambulatory Visit (INDEPENDENT_AMBULATORY_CARE_PROVIDER_SITE_OTHER): Payer: Medicare Other | Admitting: Pharmacist

## 2016-12-15 ENCOUNTER — Encounter: Payer: Self-pay | Admitting: Pharmacist

## 2016-12-15 VITALS — BP 138/90 | HR 81 | Ht 74.0 in | Wt 290.5 lb

## 2016-12-15 DIAGNOSIS — Z0000 Encounter for general adult medical examination without abnormal findings: Secondary | ICD-10-CM

## 2016-12-15 NOTE — Progress Notes (Signed)
Patient ID: [name] [name], male   DOB: [birth year], 66 y.o.   MRN: [id number]     Subjective:   [name] [name] is a 66 y.o. male who presents for a subsequent Medicare Annual Wellness Visit.  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 No ETHO or substance use.   Review of Systems  Review of Systems  Constitutional: Negative.   HENT: Negative.   Eyes: Positive for blurred vision.  Respiratory: Negative.   Cardiovascular: Negative.   Gastrointestinal: Positive for constipation (from time to time - relieved with as needed Linzess ).  Genitourinary: Negative.   Musculoskeletal: Positive for back pain, falls and joint pain.  Skin: Negative.   Neurological: Positive for tingling (has peripheral neuropathy).  Endo/Heme/Allergies: Bruises/bleeds easily (taking both Eliquis and Clopidogrel).  Psychiatric/Behavioral: The patient has insomnia.    Patient states that he was sleeping well when taking sertraline 25mg  - 50mg  qd but began to have trouble sleeping with 100mg  so stopped completely.      Current Medications (verified) Outpatient Encounter Prescriptions as of 12/15/2016  Medication Sig  . apixaban (ELIQUIS) 5 MG TABS tablet Take 1 tablet (5 mg total) by mouth 2 (two) times daily.  . clopidogrel (PLAVIX) 75 MG tablet Take 1 tablet (75 mg total) by mouth daily with breakfast.  . Dulaglutide (TRULICITY) 1.5 GB/1.5VV SOPN Inject 1.5 mg into the skin as directed. Every friday  . fenofibrate 160 MG tablet TAKE ONE TABLET BY MOUTH ONCE DAILY FOR CHOLESTEROL AND  TRIGLYCERIDE  . furosemide (LASIX) 20 MG tablet TAKE ONE TABLET BY MOUTH ONCE DAILY AS NEEDED  . gabapentin (NEURONTIN) 300 MG capsule TAKE ONE CAPSULE BY MOUTH  THREE TIMES DAILY  . HYDROcodone-acetaminophen (NORCO) 10-325 MG tablet Take 1-2 tablets by mouth every 8 (eight) hours as needed for moderate pain.   Marland Kitchen insulin NPH-regular Human (NOVOLIN 70/30) (70-30) 100 UNIT/ML injection Inject 90 Units into the skin 2 (two) times daily with a meal.   . isosorbide mononitrate (IMDUR) 30 MG 24 hr tablet Take 30 mg by mouth daily.   . Lactobacillus (PROBIOTIC ACIDOPHILUS PO) Take 1 capsule by mouth daily.  Marland Kitchen linaclotide (LINZESS) 72 MCG capsule Take 1 capsule (72 mcg total) by mouth daily before breakfast. (Patient taking differently: Take 72 mcg by mouth daily as needed. )  . metoprolol tartrate (LOPRESSOR) 25 MG tablet TAKE ONE-HALF TABLET BY MOUTH TWICE DAILY (Patient taking differently: 1 tablet qpm)  . mometasone (NASONEX) 50 MCG/ACT nasal spray Place 2 sprays into the nose as needed.  . nitroGLYCERIN (NITROSTAT) 0.4 MG SL tablet Place 1 tablet (0.4 mg total) under the tongue every 5 (five) minutes x 3 doses as needed for chest pain.  . pantoprazole (PROTONIX) 40 MG tablet Take 40 mg by mouth daily.   . Red Yeast Rice 600 MG TABS Take 600 mg by mouth 2 (two) times daily.   Marland Kitchen telmisartan (MICARDIS) 40 MG tablet TAKE ONE TABLET BY MOUTH ONCE DAILY (REPLACING LISINORIL)  . [DISCONTINUED] NOVOLIN 70/30 RELION (70-30) 100 UNIT/ML injection INJECT 80 UNITS SUBCUTANEOUSLY TWICE DAILY WITH MEALS  . ALPRAZolam (XANAX) 0.5 MG tablet Take 1 tablet (0.5 mg total) by mouth 2 (two) times daily as needed. (Patient not taking: Reported on 12/15/2016)  . Evolocumab (  REPATHA SURECLICK) 893 MG/ML SOAJ Inject 1 pen into the skin every 14 (fourteen) days.  Marland Kitchen sertraline (ZOLOFT) 100 MG tablet Take 1 tablet (100 mg total) by mouth daily. (Patient not taking: Reported on 12/15/2016)   No facility-administered encounter medications on file as of 12/15/2016.     Allergies (verified) Shellfish allergy; Ace inhibitors; Invokana [canagliflozin]; Metformin and related; Pravastatin  sodium; Fenofibrate; Iodine; Lisinopril; Livalo [pitavastatin]; Crestor [rosuvastatin]; Horse-derived products; Lexapro [escitalopram oxalate]; and Lipitor [atorvastatin]   History: Past Medical History:  Diagnosis Date  . AODM   . Arthritis   . CAD (coronary artery disease)    a. 2010: DES to CTO of RCA. EF 55% c. 07/2016: cath showing total occlusion within previously placed RCA stent (collaterals present), severe stenosis along LCx and OM1 (treated with 2 overlapping DES).   . Cellulitis and abscess rt groin   . CHEST PAIN-UNSPECIFIED   . Diabetes mellitus   . Disorders of iron metabolism   . DISORDERS OF IRON METABOLISM   . GERD (gastroesophageal reflux disease)   . Hyperlipidemia   . Hypertension   . Low serum testosterone level   . Medically noncompliant   . MURMUR   . Myocardial infarction [facility])    Past Surgical History:  Procedure Laterality Date  . BACK SURGERY  2015   ACDF by Dr. [name]  . COLONOSCOPY N/A 10/01/2014   Procedure: COLONOSCOPY;  Surgeon: Daneil Dolin, MD;  Location[facility];  Service: Endoscopy;  Laterality: N/A;  815  . CORONARY STENT INTERVENTION N/A 07/30/2016   Procedure: Coronary Stent Intervention;  Surgeon: [name], MD;  Location: [facility];  Service: Cardiovascular;  Laterality: N/A;  . CORONARY STENT PLACEMENT  2000   By Dr. Olevia Perches  . EP IMPLANTABLE DEVICE N/A 05/25/2016   Procedure: Loop Recorder Insertion;  Surgeon: [name], MD;  Location: [place] [facility];  Service: Cardiovascular;  Laterality: N/A;  . ESOPHAGOGASTRODUODENOSCOPY     esophagus stretched remotely at [facility]  . ESOPHAGOGASTRODUODENOSCOPY N/A 10/01/2014   Procedure: ESOPHAGOGASTRODUODENOSCOPY (EGD);  Surgeon: Daneil Dolin, MD;  Location[facility];  Service: Endoscopy;  Laterality: N/A;  . HERNIA REPAIR  8101   umbilical  . LEFT HEART CATH AND CORONARY ANGIOGRAPHY N/A 07/30/2016   Procedure: Left Heart Cath and Coronary Angiography;  Surgeon:  [name], MD;  Location: [facility] [facility] LAB;  Service: Cardiovascular;  Laterality: N/A;  . LESION REMOVAL     Lip and hand   . NECK SURGERY     Family History  Problem Relation Age of Onset  . Diabetes Father   . Valvular heart disease Father   . Arthritis Father   . Heart disease Father   . Stroke Father   . Alzheimer's disease Mother   . Hyperlipidemia Mother   . Hypertension Mother   . Arthritis Mother   . Lung cancer Mother   . Stroke Mother   . Arthritis/Rheumatoid Sister   . Diabetes Sister   . Hypertension Sister   . Hyperlipidemia Sister   . Dementia Maternal Aunt   . Dementia Maternal Uncle   . Heart disease Maternal Uncle   . Stomach cancer Paternal Uncle   . Colon cancer Neg Hx   . Liver disease Neg Hx    Social History   Occupational History  . retired    Social History Main Topics  . Smoking status: Former Smoker    Packs/day: 0.25    Years: 51.00  Types: Cigarettes    Quit date: 08/14/2016  . Smokeless tobacco: Never Used     Comment: smokes  a pack a week  . Alcohol use No  . Drug use: No  . Sexual activity: Yes    Do you feel safe at home?  Yes Are there smokers in your home (other than you)? No  Dietary issues and exercise activities discussed: Current Exercise Habits: The patient does not participate in regular exercise at present, Exercise limited by: cardiac condition(s);neurologic condition(s);orthopedic condition(s)  Current Dietary habits:  Breakfast -  3 eggs, bacon or sausage, hash browns and gravy; biscuit or toast Lunch - skips Supper - meats; starch and green vegetable Beverages - coffee, water, 2 diet sodas per day (down from about 6 per day 6 months ago)  Cardiac Risk Factors include: advanced age (>19men, >56 women);diabetes mellitus;dyslipidemia;family history of premature cardiovascular disease;male gender;microalbuminuria;obesity (BMI >30kg/m2);sedentary lifestyle;smoking/ tobacco exposure  Objective:    Today's  Vitals   12/15/16 0954  BP: 138/90  Pulse: 81  Weight: 290 lb 8 oz (131.8 kg)  Height: 6\' 2"  (1.88 m)   Body mass index is 37.3 kg/m.   Activities of Daily Living In your present state of health, do you have any difficulty performing the following activities: 12/15/2016 07/29/2016  Hearing? N N  Vision? Y N  Difficulty concentrating or making decisions? N N  Walking or climbing stairs? Y N  Dressing or bathing? N N  Doing errands, shopping? Tempie Donning  Preparing Food and eating ? N -  Using the Toilet? N -  In the past six months, have you accidently leaked urine? N -  Do you have problems with loss of bowel control? N -  Managing your Medications? N -  Managing your Finances? N -  Housekeeping or managing your Housekeeping? N -  Some recent data might be hidden     Depression Screen PHQ 2/9 Scores 12/15/2016 11/26/2016 09/28/2016 08/26/2016  PHQ - 2 Score 0 0 0 1  PHQ- 9 Score 3 - - -     Fall Risk Fall Risk  12/15/2016 11/26/2016 09/28/2016 08/26/2016 07/09/2016  Falls in the past year? Yes No No No Yes  Number falls in past yr: 2 or more - - - 2 or more  Injury with Fall? No - - - No  Risk Factor Category  High Fall Risk - - - -  Risk for fall due to : History of fall(s);Impaired balance/gait;Medication side effect - - - -  Follow up Falls prevention discussed;Falls evaluation completed - - - -   Fall Assessment:  Pt reports that he uses cane sometimes for ambulation No pets in home Has night time lighting throughout home - night lights Disease related fall risks - atrial fibrillation, back pain, arthritis, neuropathy, diabetes Medication related fall risks - pain medication, BP meds, insuiln / hypoglycemia risk, alprazolam, SSRI  Cognitive Function: MMSE - Mini Mental State Exam 12/15/2016 12/11/2015 08/01/2014  Orientation to time 5 5 5   Orientation to Place 5 5 5   Registration - 3 3  Attention/ Calculation 5 5 5   Recall - 3 1  Language- name 2 objects 2 2 1   Language-  repeat - 1 1  Language- follow 3 step command - 3 3  Language- read & follow direction - 1 1  Write a sentence - 1 1  Copy design - 1 1  Total score - 30 27    Immunizations and Health Maintenance Immunization History  Administered Date(s) Administered  . Influenza,inj,Quad PF,36+ Mos 02/26/2016  . Tdap 09/17/2015   There are no preventive care reminders to display for this patient.  Patient Care Team: [name], FNP as PCP - General (Family Medicine) Rodolph Bong, MD as Attending Physician (Cardiology) Glenna Fellows, MD as Attending Physician (Neurosurgery) Lendon Colonel, NP as Nurse Practitioner (Nurse Practitioner) [name], MD as Consulting Physician (Vascular Surgery) Carolan Clines, MD as Consulting Physician (Urology) [name] Cristopher Estimable, MD as Consulting Physician (Gastroenterology) Dorene Ar, MD as Consulting Physician (Pain Medicine) Melvenia Beam, MD as Consulting Physician (Neurology)  Indicate any recent Medical Services you may have received from other than [facility] providers in the past year (date may be approximate).    Assessment:    Annual Wellness Visit  Blurry Vision - patient is to follow up with Dr Rona Ravens within 1 month High fall risk  Screening Tests Health Maintenance  Topic Date Due  . PNA vac Low Risk Adult (1 of 2 - PCV13) 09/29/2026 (Originally 10/12/2015)  . INFLUENZA VACCINE  12/16/2016  . FOOT EXAM  01/12/2017  . OPHTHALMOLOGY EXAM  03/11/2017  . HEMOGLOBIN A1C  05/29/2017  . COLONOSCOPY  09/30/2024  . TETANUS/TDAP  09/16/2025  . Hepatitis C Screening  Completed        Plan:   During the course of the visit Lon was educated and counseled about the following appropriate screening and preventive services:   Vaccines to include Pneumoccal, Influenza,  Td, and Shingles - patient declined all recommended vaccines  Colorectal cancer screening - next due 09/2017  Cardiovascular disease screening - UTD  Diabetes  - continue current meds for DM.  Glaucoma screening / Diabetic Eye Exam - UTD  Depression - recommended he restart zoloft 50mg  qd or 1/2 tablet qd  Nutrition counseling - reviewed low fat and low CHO diet.   Smoking cessation counseling- -discussed stress management and other options for helping with stress other than smoking.   Advanced Directives - has plan to see lawyer about this later today.  Will bring in copy of AD once signed  Physical Activity - encouraged to use pool at [facility]  Recommended PT for balance assessment and treatment - patient declined  Recommended lung cancer screeing / Low dose CT - patient declined.   Goals    . Eat more fruits and vegetables    . Increase physical activity        Patient Instructions (the written plan) were given to the patient.   Cherre Robins, PharmD   12/15/2016

## 2016-12-15 NOTE — Patient Instructions (Addendum)
[name] , Thank you for taking time to come for your Medicare Wellness Visit. I appreciate your ongoing commitment to your health goals. Please review the following plan we discussed and let me know if I can assist you in the future.   These are the goals we discussed:  Call office if you decide that you would like to try physical therapy - we can send in referral.  Look into going back to [facility] to pool for exercise.   Restart sertraline 100mg  1/4 to 1/2 tablet once a day.   Goals    . Eat more fruits and vegetables    . Increase physical activity       This is a list of the screening recommended for you and due dates:  Health Maintenance  Topic Date Due  . Pneumonia vaccines (1 of 2 - PCV13) 09/29/2026*  . Flu Shot  12/16/2016  . Complete foot exam   01/12/2017  . Eye exam for diabetics  03/11/2017  . Hemoglobin A1C  05/29/2017  . Colon Cancer Screening  09/30/2024  . Tetanus Vaccine  09/16/2025  .  Hepatitis C: One time screening is recommended by Center for Disease Control  (CDC) for  adults born from [birth year] through 1965.   Completed  *Topic was postponed. The date shown is not the original due date.    Fall Prevention in the Home Falls can cause injuries and can affect people from all age groups. There are many simple things that you can do to make your home safe and to help prevent falls. What can I do on the outside of my home?  Regularly repair the edges of walkways and driveways and fix any cracks.  Remove high doorway thresholds.  Trim any shrubbery on the main path into your home.  Use bright outdoor lighting.  Clear walkways of debris and clutter, including tools and rocks.  Regularly check that handrails are securely fastened and in good repair. Both sides of any steps should have handrails.  Install guardrails along the edges of any raised decks or porches.  Have leaves, snow, and ice cleared regularly.  Use sand or salt on walkways during winter  months.  In the garage, clean up any spills right away, including grease or oil spills. What can I do in the bathroom?  Use night lights.  Install grab bars by the toilet and in the tub and shower. Do not use towel bars as grab bars.  Use non-skid mats or decals on the floor of the tub or shower.  If you need to sit down while you are in the shower, use a plastic, non-slip stool.  Keep the floor dry. Immediately clean up any water that spills on the floor.  Remove soap buildup in the tub or shower on a regular basis.  Attach bath mats securely with double-sided non-slip rug tape.  Remove throw rugs and other tripping hazards from the floor. What can I do in the bedroom?  Use night lights.  Make sure that a bedside light is easy to reach.  Do not use oversized bedding that drapes onto the floor.  Have a firm chair that has side arms to use for getting dressed.  Remove throw rugs and other tripping hazards from the floor. What can I do in the kitchen?  Clean up any spills right away.  Avoid walking on wet floors.  Place frequently used items in easy-to-reach places.  If you need to reach for something above you,  use a sturdy step stool that has a grab bar.  Keep electrical cables out of the way.  Do not use floor polish or wax that makes floors slippery. If you have to use wax, make sure that it is non-skid floor wax.  Remove throw rugs and other tripping hazards from the floor. What can I do in the stairways?  Do not leave any items on the stairs.  Make sure that there are handrails on both sides of the stairs. Fix handrails that are broken or loose. Make sure that handrails are as long as the stairways.  Check any carpeting to make sure that it is firmly attached to the stairs. Fix any carpet that is loose or worn.  Avoid having throw rugs at the top or bottom of stairways, or secure the rugs with carpet tape to prevent them from moving.  Make sure that you  have a light switch at the top of the stairs and the bottom of the stairs. If you do not have them, have them installed. What are some other fall prevention tips?  Wear closed-toe shoes that fit well and support your feet. Wear shoes that have rubber soles or low heels.  When you use a stepladder, make sure that it is completely opened and that the sides are firmly locked. Have someone hold the ladder while you are using it. Do not climb a closed stepladder.  Add color or contrast paint or tape to grab bars and handrails in your home. Place contrasting color strips on the first and last steps.  Use mobility aids as needed, such as canes, walkers, scooters, and crutches.  Turn on lights if it is dark. Replace any light bulbs that burn out.  Set up furniture so that there are clear paths. Keep the furniture in the same spot.  Fix any uneven floor surfaces.  Choose a carpet design that does not hide the edge of steps of a stairway.  Be aware of any and all pets.  Review your medicines with your healthcare provider. Some medicines can cause dizziness or changes in blood pressure, which increase your risk of falling. Talk with your health care provider about other ways that you can decrease your risk of falls. This may include working with a physical therapist or trainer to improve your strength, balance, and endurance. This information is not intended to replace advice given to you by your health care provider. Make sure you discuss any questions you have with your health care provider. Document Released: 04/24/2002 Document Revised: 10/01/2015 Document Reviewed: 06/08/2014 Elsevier Interactive Patient Education  2017 [employer].

## 2016-12-21 ENCOUNTER — Ambulatory Visit (INDEPENDENT_AMBULATORY_CARE_PROVIDER_SITE_OTHER): Payer: Medicare Other | Admitting: *Deleted

## 2016-12-21 DIAGNOSIS — I639 Cerebral infarction, unspecified: Secondary | ICD-10-CM | POA: Diagnosis not present

## 2016-12-22 NOTE — Progress Notes (Signed)
Carelink Summary Report / Loop Recorder 

## 2016-12-29 DIAGNOSIS — M4316 Spondylolisthesis, lumbar region: Secondary | ICD-10-CM | POA: Diagnosis not present

## 2016-12-29 DIAGNOSIS — M5126 Other intervertebral disc displacement, lumbar region: Secondary | ICD-10-CM | POA: Diagnosis not present

## 2016-12-30 LAB — CUP PACEART REMOTE DEVICE CHECK
Date Time Interrogation Session: 20180806160836
Implantable Pulse Generator Implant Date: 20180108

## 2017-01-03 ENCOUNTER — Other Ambulatory Visit: Payer: Self-pay | Admitting: Family

## 2017-01-04 ENCOUNTER — Telehealth: Payer: Self-pay | Admitting: Internal Medicine

## 2017-01-04 NOTE — Telephone Encounter (Signed)
Returned call to pt. He states he started Repatha injections 2 weeks ago. He is due for his second injection today and states that he has been having trouble walking, his back and legs have been hurting and he feels like he has had the flu since he gave his first injection. Symptoms started the day after his first injection. He reports his back and leg aches have progressively worsened over the past few weeks. He states it feels the same as when he took statins previously.  Pt also previously intolerant to: Crestor 5mg  qod, Zetia 10mg  daily, Fenofibrate 160mg  daily, Gemfibrozil 600mg  bid, Livalo 2g daily, Pravastatin 40mg  daily, Atorvastatin 40mg  daily - muscle aches/pain, joint pain, unable to walk.  He is taking fenofibrate and wishes to resume red yeast rice at 600mg  BID. Discussed clinical trials with pt - he would be a good candidate for the CLEAR trial. He is hesitant about this but after discussing limited options for cholesterol management at this point, he is willing to learn more about the trial.  Will route to research nurses to f/u with pt regarding additional information and possible screening for CLEAR trial.

## 2017-01-04 NOTE — Telephone Encounter (Signed)
New message    Pt is calling asking for a call back. He said it's about Repatha.

## 2017-01-05 ENCOUNTER — Ambulatory Visit (INDEPENDENT_AMBULATORY_CARE_PROVIDER_SITE_OTHER): Payer: Medicare Other | Admitting: Cardiology

## 2017-01-05 ENCOUNTER — Encounter: Payer: Self-pay | Admitting: Cardiology

## 2017-01-05 VITALS — BP 148/88 | HR 89 | Ht 74.0 in | Wt 291.0 lb

## 2017-01-05 DIAGNOSIS — F329 Major depressive disorder, single episode, unspecified: Secondary | ICD-10-CM

## 2017-01-05 DIAGNOSIS — Z9861 Coronary angioplasty status: Secondary | ICD-10-CM | POA: Diagnosis not present

## 2017-01-05 DIAGNOSIS — I480 Paroxysmal atrial fibrillation: Secondary | ICD-10-CM | POA: Diagnosis not present

## 2017-01-05 DIAGNOSIS — I10 Essential (primary) hypertension: Secondary | ICD-10-CM

## 2017-01-05 DIAGNOSIS — I2510 Atherosclerotic heart disease of native coronary artery without angina pectoris: Secondary | ICD-10-CM | POA: Diagnosis not present

## 2017-01-05 DIAGNOSIS — E782 Mixed hyperlipidemia: Secondary | ICD-10-CM

## 2017-01-05 DIAGNOSIS — F32A Depression, unspecified: Secondary | ICD-10-CM

## 2017-01-05 MED ORDER — METOPROLOL TARTRATE 25 MG PO TABS: 25.0000 mg | ORAL_TABLET | Freq: Two times a day (BID) | ORAL | 3 refills | Status: DC

## 2017-01-05 NOTE — Patient Instructions (Signed)
Medication Instructions:  Increase lopressor to 25 mg - two times daily  Labwork: none Testing/Procedures: none  Follow-Up: Your physician recommends that you schedule a follow-up appointment in: 4 months    Any Other Special Instructions Will Be Listed Below (If Applicable). You have been referred to [facility], someone form their office will contact you soon .      If you need a refill on your cardiac medications before your next appointment, please call your pharmacy.

## 2017-01-05 NOTE — Progress Notes (Signed)
Clinical Summary [name] [name] is a 66 y.o.male seen today for follow up of the following medical problems.   1. CAD -  history of coronary stenting stenting (DES to RCA in RCA CTO 2010, , DM2, HTN, HL, GERD admitted with chest pain. Admit 07/2013 with chest pain to [facility], lexiscan MPI at that time low risk and managed medically - last cath 07/2016 as reported below, received overlapping stents to LCX. Consider PCI to RCA if refractory angina.   - episode of pain in left armpit radiaiting down left arm while watching tv. No other associated symptoms. Not positional. Lasted about 5 minutes. Second episode occurred with getting upset. Feeling of a tightness in entire midchest 5/10, SOB. Resolved as he cooled off. He did take 1 NG. Sedentary lifestyle, no exertional symptoms - can have some DOE. Can have some palpitations associated.    ]2. PAF - reports occaional palpitations.    3. HL - repatha reaction, flu like symptoms after - followed in lipid clinic - reports statin allergy as well  4. HTN - compliant with meds  5. CVA - loop recorder followed by Dr Lovena Le - followed by neurology  6. AAA screen  - CT scan 08/2014 without aneurysm   Past Medical History:  Diagnosis Date  . AODM   . Arthritis   . CAD (coronary artery disease)    a. 2010: DES to CTO of RCA. EF 55% c. 07/2016: cath showing total occlusion within previously placed RCA stent (collaterals present), severe stenosis along LCx and OM1 (treated with 2 overlapping DES).   . Cellulitis and abscess rt groin   . CHEST PAIN-UNSPECIFIED   . Diabetes mellitus   . Disorders of iron metabolism   . DISORDERS OF IRON METABOLISM   . GERD (gastroesophageal reflux disease)   . Hyperlipidemia   . Hypertension   . Low serum testosterone level   . Medically noncompliant   . MURMUR   . Myocardial infarction (HCC)      Allergies  Allergen Reactions  . Shellfish Allergy Anaphylaxis and Other (See Comments)    Tongue  swelling, hives  . Ace Inhibitors Other (See Comments) and Cough    CKD, renal failure   . Invokana [Canagliflozin] Other (See Comments)    Syncope / dehydration  . Metformin And Related Itching  . Pravastatin Sodium Other (See Comments)    myalgias  . Fenofibrate Other (See Comments)    Body aches  . Iodine Other (See Comments)    ????  . Lisinopril Cough  . Livalo [Pitavastatin] Other (See Comments)    Myalgias   . Repatha [Evolocumab]     Myalgias  . Crestor [Rosuvastatin] Other (See Comments)    Myalgias   . Horse-Derived Products Rash  . Lexapro [Escitalopram Oxalate] Other (See Comments)    Buzzing in ears,headache, felt like a zombie  . Lipitor [Atorvastatin] Other (See Comments)    myalgias     Current Outpatient Prescriptions  Medication Sig Dispense Refill  . ALPRAZolam (XANAX) 0.5 MG tablet Take 1 tablet (0.5 mg total) by mouth 2 (two) times daily as needed. 60 tablet 3  . apixaban (ELIQUIS) 5 MG TABS tablet Take 1 tablet (5 mg total) by mouth 2 (two) times daily. 60 tablet 6  . clopidogrel (PLAVIX) 75 MG tablet Take 1 tablet (75 mg total) by mouth daily with breakfast. 30 tablet 11  . Dulaglutide (TRULICITY) 1.5 ZO/1.0RU SOPN Inject 1.5 mg into the skin as directed.  Every friday 12 pen 2  . fenofibrate 160 MG tablet TAKE ONE TABLET BY MOUTH ONCE DAILY FOR CHOLESTEROL AND  TRIGLYCERIDE 90 tablet 0  . furosemide (LASIX) 20 MG tablet TAKE ONE TABLET BY MOUTH ONCE DAILY AS NEEDED 90 tablet 0  . gabapentin (NEURONTIN) 300 MG capsule TAKE 1 CAPSULE BY MOUTH THREE TIMES DAILY 270 capsule 0  . HYDROcodone-acetaminophen (NORCO) 10-325 MG tablet Take 1-2 tablets by mouth every 8 (eight) hours as needed for moderate pain.     Marland Kitchen insulin NPH-regular Human (NOVOLIN 70/30) (70-30) 100 UNIT/ML injection Inject 90 Units into the skin 2 (two) times daily with a meal.     . isosorbide mononitrate (IMDUR) 30 MG 24 hr tablet Take 30 mg by mouth daily.     . Lactobacillus (PROBIOTIC  ACIDOPHILUS PO) Take 1 capsule by mouth daily.    Marland Kitchen linaclotide (LINZESS) 72 MCG capsule Take 1 capsule (72 mcg total) by mouth daily before breakfast. (Patient taking differently: Take 72 mcg by mouth daily as needed. ) 30 capsule 3  . metoprolol tartrate (LOPRESSOR) 25 MG tablet TAKE ONE-HALF TABLET BY MOUTH TWICE DAILY (Patient taking differently: 1 tablet qpm) 90 tablet 4  . mometasone (NASONEX) 50 MCG/ACT nasal spray Place 2 sprays into the nose as needed.    . nitroGLYCERIN (NITROSTAT) 0.4 MG SL tablet Place 1 tablet (0.4 mg total) under the tongue every 5 (five) minutes x 3 doses as needed for chest pain. 25 tablet 2  . pantoprazole (PROTONIX) 40 MG tablet Take 40 mg by mouth daily.     . Red Yeast Rice 600 MG CAPS Take 1 capsule by mouth 2 (two) times daily.    . sertraline (ZOLOFT) 100 MG tablet Take 1 tablet (100 mg total) by mouth daily. 90 tablet 1  . telmisartan (MICARDIS) 40 MG tablet TAKE ONE TABLET BY MOUTH ONCE DAILY (REPLACING LISINORIL) 90 tablet 4   No current facility-administered medications for this visit.      Past Surgical History:  Procedure Laterality Date  . BACK SURGERY  2015   ACDF by Dr. [name]  . COLONOSCOPY N/A 10/01/2014   Procedure: COLONOSCOPY;  Surgeon: Daneil Dolin, MD;  Location: [facility];  Service: Endoscopy;  Laterality: N/A;  815  . CORONARY STENT INTERVENTION N/A 07/30/2016   Procedure: Coronary Stent Intervention;  Surgeon: [name], MD;  Location: [place] [facility];  Service: Cardiovascular;  Laterality: N/A;  . CORONARY STENT PLACEMENT  2000   By Dr. Olevia Perches  . EP IMPLANTABLE DEVICE N/A 05/25/2016   Procedure: Loop Recorder Insertion;  Surgeon: [name], MD;  Location: [place] [facility];  Service: Cardiovascular;  Laterality: N/A;  . ESOPHAGOGASTRODUODENOSCOPY     esophagus stretched remotely at [facility]  . ESOPHAGOGASTRODUODENOSCOPY N/A 10/01/2014   Procedure: ESOPHAGOGASTRODUODENOSCOPY (EGD);  Surgeon: Daneil Dolin, MD;   Location: [facility];  Service: Endoscopy;  Laterality: N/A;  . HERNIA REPAIR  7782   umbilical  . LEFT HEART CATH AND CORONARY ANGIOGRAPHY N/A 07/30/2016   Procedure: Left Heart Cath and Coronary Angiography;  Surgeon: [name], MD;  Location: [place] [facility];  Service: Cardiovascular;  Laterality: N/A;  . LESION REMOVAL     Lip and hand   . NECK SURGERY       Allergies  Allergen Reactions  . Shellfish Allergy Anaphylaxis and Other (See Comments)    Tongue swelling, hives  . Ace Inhibitors Other (See Comments) and Cough  CKD, renal failure   . Invokana [Canagliflozin] Other (See Comments)    Syncope / dehydration  . Metformin And Related Itching  . Pravastatin Sodium Other (See Comments)    myalgias  . Fenofibrate Other (See Comments)    Body aches  . Iodine Other (See Comments)    ????  . Lisinopril Cough  . Livalo [Pitavastatin] Other (See Comments)    Myalgias   . Repatha [Evolocumab]     Myalgias  . Crestor [Rosuvastatin] Other (See Comments)    Myalgias   . Horse-Derived Products Rash  . Lexapro [Escitalopram Oxalate] Other (See Comments)    Buzzing in ears,headache, felt like a zombie  . Lipitor [Atorvastatin] Other (See Comments)    myalgias      Family History  Problem Relation Age of Onset  . Diabetes Father   . Valvular heart disease Father   . Arthritis Father   . Heart disease Father   . Stroke Father   . Alzheimer's disease Mother   . Hyperlipidemia Mother   . Hypertension Mother   . Arthritis Mother   . Lung cancer Mother   . Stroke Mother   . Arthritis/Rheumatoid Sister   . Diabetes Sister   . Hypertension Sister   . Hyperlipidemia Sister   . Dementia Maternal Aunt   . Dementia Maternal Uncle   . Heart disease Maternal Uncle   . Stomach cancer Paternal Uncle   . Colon cancer Neg Hx   . Liver disease Neg Hx      Social History Mr. Vanaman reports that he quit smoking about 4 months ago. His smoking use included  Cigarettes. He has a 12.75 pack-year smoking history. He has never used smokeless tobacco. Mr. Hallowell reports that he does not drink alcohol.   Review of Systems CONSTITUTIONAL: No weight loss, fever, chills, weakness or fatigue.  HEENT: Eyes: No visual loss, blurred vision, double vision or yellow sclerae.No hearing loss, sneezing, congestion, runny nose or sore throat.  SKIN: No rash or itching.  CARDIOVASCULAR: per hpi RESPIRATORY: No shortness of breath, cough or sputum.  GASTROINTESTINAL: No anorexia, nausea, vomiting or diarrhea. No abdominal pain or blood.  GENITOURINARY: No burning on urination, no polyuria NEUROLOGICAL: No headache, dizziness, syncope, paralysis, ataxia, numbness or tingling in the extremities. No change in bowel or bladder control.  MUSCULOSKELETAL: No muscle, back pain, joint pain or stiffness.  LYMPHATICS: No enlarged nodes. No history of splenectomy.  PSYCHIATRIC: No history of depression or anxiety.  ENDOCRINOLOGIC: No reports of sweating, cold or heat intolerance. No polyuria or polydipsia.  Marland Kitchen   Physical Examination Vitals:   01/05/17 1114  BP: (!) 148/88  Pulse: 89  SpO2: 96%   Filed Weights   01/05/17 1114  Weight: 291 lb (132 kg)    Gen: resting comfortably, no acute distress HEENT: no scleral icterus, pupils equal round and reactive, no palptable cervical adenopathy,  CV: RRR, no m/r/g, no jvd Resp: Clear to auscultation bilaterally GI: abdomen is soft, non-tender, non-distended, normal bowel sounds, no hepatosplenomegaly MSK: extremities are warm, no edema.  Skin: warm, no rash Neuro:  no focal deficits Psych: appropriate affect   Diagnostic Studies 07/2016 cath 1. Mild nonobstructive LAD stenosis with severe branch vessel disease involving the first and second diagonals 2. Total occlusion of the right coronary artery within the previously implanted stent from 2010 with TIMI 2 antegrade flow and brisk collateral filling the PDA from  the septal perforators of the LAD 3. Severe complex disease in  the left circumflex extending into the first obtuse marginal branch, treated successfully with overlapping drug-eluting stents 4. Normal LV function by echo assessment  Recommendations:  Resume anticoagulation with Eliquis tomorrow for PAF  Plavix 75 mg daily (loaded with 600 mg in cath lab)  No ASA (has had gingival bleeding and don't think he will tolerate triple anticoagulant therapy)  Consider PCI of the RCA if refractory angina. However, with collateral filling of the PDA and need for chronic anticoagulation, concern over bleeding risk, may be best for medical therapy    Assessment and Plan  1. CAD - infrequent atypical symptoms, continue to monitor at this time. Increasing lopressor for palpitations, will also have antianginal benefit  2. Afib - recent palpitations, increase lopressor to 25mg  bid - continue anticoagulation   3. HTN - mildly elevated, increasing lopressor as described above  4. Hyperlipidemia - continue to follow in lipid clinic, adverse reaction to repetha, he is discontinuing.   5. Anxiety/Depression - refer to behavioral health  F/u 4 months  Arnoldo Lenis, M.D.

## 2017-01-08 ENCOUNTER — Telehealth: Payer: Self-pay | Admitting: *Deleted

## 2017-01-08 NOTE — Telephone Encounter (Signed)
Spoke with Mr. Shreffler and he would like to come for a CLEAR Research study screening on 02/09/2017 @0800 .  He will stop taking Red yeast Rice on 01/26/2017, 2 weeks prior to the screening visit.

## 2017-01-13 ENCOUNTER — Ambulatory Visit (INDEPENDENT_AMBULATORY_CARE_PROVIDER_SITE_OTHER): Payer: Medicare Other | Admitting: Neurology

## 2017-01-13 VITALS — BP 164/88 | HR 76 | Ht 74.0 in | Wt 292.0 lb

## 2017-01-13 DIAGNOSIS — R0681 Apnea, not elsewhere classified: Secondary | ICD-10-CM

## 2017-01-13 NOTE — Progress Notes (Signed)
[facility]    Provider:  Dr Jaynee Eagles Referring Provider: [name], FNP Primary Care Physician:  [name], FNP  CC:  Occlusion of vertebral artery  Interval history 01/13/2017: Patient is here with his wife, he is lovely. He has good days and bad days, he has 1-2 cigarrettes every 2 weeks, he does not drink alcohol. He is on Eliquis and Plavix. Loop recorder identified afib. He has palpitations and yesterday he gets short of breath.His metoprolol was increased. He has shallow breathing and stops breathing at night. He is morbidly obese. He still snores.   HPI:  [name] [name] is a 66 y.o. male here as a referral from Dr. Lenna Gilford for atherosclerotic disease. PMHx HTN, diabetes, diabetic neuropathy, HLD, depression, obesity. Patient is here after admission to the [facility] in early January of this year for visual field changes, blurry vision, Sharp pain behind his right eye and right-sided headache.He was admitted to telemetry for further evaluation. Carotid ultrasound did not show any significant ICA stenosis. MRI of the brain without contrast showed chronic lacunar infarcts but nothing new. Imaging of the vessels showed atheromatous disease. EEG was normal. He did have a complete stroke workup and he is here for follow-up today. He was changed from aspirin to Plavix inpatient.  He stopped taking the Plavix. He has gone back to taking a baby aspirin. He is trying to manage his diabetes. He is on Trulicity now and working on Mudlogger with pcp. He is taking his glucose multiple times a day and adjusting insulin sliding scale. He tried a statin again and he can't tolerate it and stopped it. He is trying to eat better. He sees Administrator, arts.  He had some right sided sharp neck pain that radiated down the right arm. Since he left the hospital he can hear his heart beat in his neck radiating to the right ear where the sharp pain was (points to the carotid  on the right)  Reviewed notes, labs and imaging from outside physicians, which showed:  HgbA1c 10 05/2016 Triglycerides 647 Cholesterol 239 Cannot calculat LDL  MRI brain:  IMPRESSION: 1. No acute intracranial abnormality. 2. Chronic left basal ganglia lacunar infarct.  MRA head: 1. No visible flow in the left V4 segment. See MRA neck that is reported separately. 2. High-grade atheromatous narrowings at the left ICA anterior genu, bilateral M2 branches, and bilateral P2 branches.  MRA neck : 1. Thready flow within the left vertebral artery which is new compared to 2015 imaging. Suspect a high-grade proximal left subclavian stenosis favoring steal phenomenon. Dissection is the main differential consideration. CTA is recommended to further evaluate. 2. Cervical carotid atherosclerosis with ~ 40% proximal left ICA Stenosis.  CT Angio of the neck: 1. Left V3 segment occlusion. Distal left V4 reconstitution by the basilar with patent left PICA. 2. Diffuse narrowing and luminal irregularity of the left vertebral artery. Correlate for neck pain as dissection or atherosclerosis and underfilling could give this appearance. 3. Mild atherosclerotic narrowing of the left subclavian ostium. No subclavian flow limiting stenosis. 4. Cervical carotid atherosclerosis with 30 to 40 % ICA narrowing on the left.   Review of Systems: Patient complains of symptoms per HPI as well as the following symptoms: Fatigue, heat intolerance, joint pain, back pain, aching muscles, muscle cramps, walking difficulty, neck pain, memory loss, dizziness, headache, numbness, speech difficulty, weakness, agitation, depression. Pertinent negatives and positives per HPI. All others negative.   Social History  Social History  . Marital status: Married    Spouse name: N/A  . Number of children: 2  . Years of education: N/A   Occupational History  . retired    Social History Main Topics  .  Smoking status: Former Smoker    Packs/day: 0.25    Years: 51.00    Types: Cigarettes    Quit date: 08/14/2016  . Smokeless tobacco: Never Used     Comment: smokes  a pack a week  . Alcohol use No  . Drug use: No  . Sexual activity: Yes   Other Topics Concern  . Not on file   Social History Narrative   Lives with his wife.  Retired.  Unable to afford medicines.      Family History  Problem Relation Age of Onset  . Diabetes Father   . Valvular heart disease Father   . Arthritis Father   . Heart disease Father   . Stroke Father   . Alzheimer's disease Mother   . Hyperlipidemia Mother   . Hypertension Mother   . Arthritis Mother   . Lung cancer Mother   . Stroke Mother   . Arthritis/Rheumatoid Sister   . Diabetes Sister   . Hypertension Sister   . Hyperlipidemia Sister   . Dementia Maternal Aunt   . Dementia Maternal Uncle   . Heart disease Maternal Uncle   . Stomach cancer Paternal Uncle   . Colon cancer Neg Hx   . Liver disease Neg Hx     Past Medical History:  Diagnosis Date  . AODM   . Arthritis   . CAD (coronary artery disease)    a. 2010: DES to CTO of RCA. EF 55% c. 07/2016: cath showing total occlusion within previously placed RCA stent (collaterals present), severe stenosis along LCx and OM1 (treated with 2 overlapping DES).   . Cellulitis and abscess rt groin   . CHEST PAIN-UNSPECIFIED   . Diabetes mellitus   . Disorders of iron metabolism   . DISORDERS OF IRON METABOLISM   . GERD (gastroesophageal reflux disease)   . Hyperlipidemia   . Hypertension   . Low serum testosterone level   . Medically noncompliant   . MURMUR   . Myocardial infarction [facility])     Past Surgical History:  Procedure Laterality Date  . BACK SURGERY  2015   ACDF by Dr. [name]  . COLONOSCOPY N/A 10/01/2014   Procedure: COLONOSCOPY;  Surgeon: Daneil Dolin, MD;  Location: [facility];  Service: Endoscopy;  Laterality: N/A;  815  . CORONARY STENT INTERVENTION N/A 07/30/2016    Procedure: Coronary Stent Intervention;  Surgeon: [name], MD;  Location: [place] [facility];  Service: Cardiovascular;  Laterality: N/A;  . CORONARY STENT PLACEMENT  2000   By Dr. Olevia Perches  . EP IMPLANTABLE DEVICE N/A 05/25/2016   Procedure: Loop Recorder Insertion;  Surgeon: [name], MD;  Location: [place] [facility];  Service: Cardiovascular;  Laterality: N/A;  . ESOPHAGOGASTRODUODENOSCOPY     esophagus stretched remotely at [facility]  . ESOPHAGOGASTRODUODENOSCOPY N/A 10/01/2014   Procedure: ESOPHAGOGASTRODUODENOSCOPY (EGD);  Surgeon: Daneil Dolin, MD;  Location: [facility];  Service: Endoscopy;  Laterality: N/A;  . HERNIA REPAIR  5366   umbilical  . LEFT HEART CATH AND CORONARY ANGIOGRAPHY N/A 07/30/2016   Procedure: Left Heart Cath and Coronary Angiography;  Surgeon: [name], MD;  Location: [place] [facility];  Service: Cardiovascular;  Laterality: N/A;  . LESION  REMOVAL     Lip and hand   . NECK SURGERY      Current Outpatient Prescriptions  Medication Sig Dispense Refill  . ALPRAZolam (XANAX) 0.5 MG tablet Take 1 tablet (0.5 mg total) by mouth 2 (two) times daily as needed. 60 tablet 3  . apixaban (ELIQUIS) 5 MG TABS tablet Take 1 tablet (5 mg total) by mouth 2 (two) times daily. 60 tablet 6  . clopidogrel (PLAVIX) 75 MG tablet Take 1 tablet (75 mg total) by mouth daily with breakfast. 30 tablet 11  . Dulaglutide (TRULICITY) 1.5 YH/0.6CB SOPN Inject 1.5 mg into the skin as directed. Every friday 12 pen 2  . fenofibrate 160 MG tablet TAKE ONE TABLET BY MOUTH ONCE DAILY FOR CHOLESTEROL AND  TRIGLYCERIDE 90 tablet 0  . furosemide (LASIX) 20 MG tablet TAKE ONE TABLET BY MOUTH ONCE DAILY AS NEEDED 90 tablet 0  . gabapentin (NEURONTIN) 300 MG capsule TAKE 1 CAPSULE BY MOUTH THREE TIMES DAILY 270 capsule 0  . HYDROcodone-acetaminophen (NORCO) 10-325 MG tablet Take 1-2 tablets by mouth every 8 (eight) hours as needed for moderate pain.     Marland Kitchen insulin NPH-regular Human  (NOVOLIN 70/30) (70-30) 100 UNIT/ML injection Inject 90 Units into the skin 2 (two) times daily with a meal.     . isosorbide mononitrate (IMDUR) 30 MG 24 hr tablet Take 30 mg by mouth daily.     . Lactobacillus (PROBIOTIC ACIDOPHILUS PO) Take 1 capsule by mouth daily.    Marland Kitchen linaclotide (LINZESS) 72 MCG capsule Take 1 capsule (72 mcg total) by mouth daily before breakfast. (Patient taking differently: Take 72 mcg by mouth daily as needed. ) 30 capsule 3  . metoprolol tartrate (LOPRESSOR) 25 MG tablet Take 1 tablet (25 mg total) by mouth 2 (two) times daily. 180 tablet 3  . mometasone (NASONEX) 50 MCG/ACT nasal spray Place 2 sprays into the nose as needed.    . nitroGLYCERIN (NITROSTAT) 0.4 MG SL tablet Place 1 tablet (0.4 mg total) under the tongue every 5 (five) minutes x 3 doses as needed for chest pain. 25 tablet 2  . pantoprazole (PROTONIX) 40 MG tablet Take 40 mg by mouth daily.     . Red Yeast Rice 600 MG CAPS Take 1 capsule by mouth 2 (two) times daily.    . sertraline (ZOLOFT) 100 MG tablet Take 1 tablet (100 mg total) by mouth daily. 90 tablet 1  . telmisartan (MICARDIS) 40 MG tablet TAKE ONE TABLET BY MOUTH ONCE DAILY (REPLACING LISINORIL) 90 tablet 4   No current facility-administered medications for this visit.     Allergies as of 01/13/2017 - Review Complete 01/13/2017  Allergen Reaction Noted  . Shellfish allergy Anaphylaxis and Other (See Comments) 09/06/2014  . Ace inhibitors Other (See Comments) and Cough 08/09/2013  . Invokana [canagliflozin] Other (See Comments) 09/04/2013  . Metformin and related Itching 07/12/2013  . Pravastatin sodium Other (See Comments) 11/14/2014  . Fenofibrate Other (See Comments) 11/14/2015  . Iodine Other (See Comments) 09/07/2014  . Lisinopril Cough 10/04/2014  . Livalo [pitavastatin] Other (See Comments) 06/25/2016  . Repatha [evolocumab]  01/04/2017  . Crestor [rosuvastatin] Other (See Comments) 06/05/2013  . Horse-derived products Rash  07/25/2008  . Lexapro [escitalopram oxalate] Other (See Comments) 01/13/2016  . Lipitor [atorvastatin] Other (See Comments) 06/05/2013    Vitals: BP (!) 164/88 (BP Location: Right Arm, Patient Position: Sitting, Cuff Size: Large)   Pulse 76   Ht 6\' 2"  (1.88 m)   Wt  292 lb (132.5 kg)   BMI 37.49 kg/m  Last Weight:  Wt Readings from Last 1 Encounters:  01/13/17 292 lb (132.5 kg)   Last Height:   Ht Readings from Last 1 Encounters:  01/13/17 6\' 2"  (1.88 m)    Physical exam: Exam: Gen: NAD, conversant, well nourised, obese, well groomed                     Eyes: Conjunctivae clear without exudates or hemorrhage  Neuro: Detailed Neurologic Exam  Speech:    Speech is normal; fluent and spontaneous with normal comprehension.  Cognition:    The patient is oriented to person, place, and time;  Cranial Nerves:    The pupils are equal, round, and reactive to light.Visual fields are full to finger confrontation. Extraocular movements are intact. Trigeminal sensation is intact and the muscles of mastication are normal. The face is symmetric. The palate elevates in the midline. Hearing intact. Voice is normal. Shoulder shrug is normal. The tongue has normal motion without fasciculations.   Gait:    Wide based due to extremely large body habitus  Motor Observation:    No asymmetry, no atrophy, and no involuntary movements noted. Tone:    Normal muscle tone.    Posture:    Posture is normal. normal erect    Strength:    Strength is V/V in the upper and lower limbs.      Sensation: decreased distally in a glove/stocking distribution.     Reflex Exam:  DTR's:    Absent AJs. Deep tendon reflexes in the upper and lower extremities are symmetrical bilaterally.     Assessment/Plan:  Mr.Teejay A Groganis a 66y.o.malewith history of hypertension, hyperlipidemia, ongoing tobacco use, remote stroke, diabetes mellitus, coronary artery disease, history of medical noncompliance,and  diabetes mellituspresenting with recurrent syncope. Workup shows Cerebrovascular atherosclerotic disease.   Morbid obesity: continues, not improved. Discussed.  Likely OSA or hypoventilation syndrome: He has shallow breathing and stops breathing at night. He has afib and palpitations. He is morbidly obese. He still snores. Needs a sleep eval.   Fatigued, memory loss, daytime somnolence: needs sleep study (see above). He nods off during the day. Gets very tired.   Afib: continue eliquis and metoprolol  Stroke an dvascular risk factors: I had a long d/w patient about his stroke risk, risk for recurrent stroke/TIAs, personally independently reviewed imaging studies and stroke evaluation results and answered questions.Continue Plavix and Eliquis for secondary stroke prevention and maintain strict control of hypertension with blood pressure goal below 130/90, diabetes with hemoglobin A1c goal below 6.5% and lipids with LDL cholesterol goal below 70 mg/dL.Patient has h/o statin intolerance hence recommend new PCSK9 inhibitor like Praluent. Recommend he see his cardiologist MD to get prescription. I also advised the patient to eat a healthy diet with plenty of whole grains, cereals, fruits and vegetables, exercise regularly and maintain ideal body weight .Followup in the future with me in 6 months or call earlier if necessary. Recommend aggressive management of his uncontrolled vascular risk factors (cholesterol, diabetes, HTN).  With diffuse cerebrovascular disease would avoid hypotension. Discussed smoking cessation and obesity. Highly encouraged him to manage his vascular risk factors which are extensive.  Sarina Ill, MD  [facility] [address] [place], [place] [address]  Phone [phone] Fax [phone]  A total of 25 minutes was spent face-to-face with this patient. Over half this time was spent on counseling patient on the OSA, morbid obesity, Afib,  stroke diagnosis and different diagnostic and therapeutic options available.

## 2017-01-13 NOTE — Patient Instructions (Signed)

## 2017-01-14 ENCOUNTER — Encounter (HOSPITAL_COMMUNITY): Payer: Self-pay | Admitting: Psychiatry

## 2017-01-14 ENCOUNTER — Ambulatory Visit (INDEPENDENT_AMBULATORY_CARE_PROVIDER_SITE_OTHER): Payer: Medicare Other | Admitting: Psychiatry

## 2017-01-14 VITALS — BP 120/92 | HR 103 | Ht 74.0 in | Wt 291.0 lb

## 2017-01-14 DIAGNOSIS — F419 Anxiety disorder, unspecified: Secondary | ICD-10-CM

## 2017-01-14 DIAGNOSIS — R51 Headache: Secondary | ICD-10-CM

## 2017-01-14 DIAGNOSIS — Z736 Limitation of activities due to disability: Secondary | ICD-10-CM | POA: Diagnosis not present

## 2017-01-14 DIAGNOSIS — Z638 Other specified problems related to primary support group: Secondary | ICD-10-CM | POA: Diagnosis not present

## 2017-01-14 DIAGNOSIS — Z634 Disappearance and death of family member: Secondary | ICD-10-CM | POA: Diagnosis not present

## 2017-01-14 DIAGNOSIS — M549 Dorsalgia, unspecified: Secondary | ICD-10-CM

## 2017-01-14 DIAGNOSIS — E119 Type 2 diabetes mellitus without complications: Secondary | ICD-10-CM

## 2017-01-14 DIAGNOSIS — R5383 Other fatigue: Secondary | ICD-10-CM

## 2017-01-14 DIAGNOSIS — R5381 Other malaise: Secondary | ICD-10-CM

## 2017-01-14 DIAGNOSIS — Z810 Family history of intellectual disabilities: Secondary | ICD-10-CM

## 2017-01-14 DIAGNOSIS — M7989 Other specified soft tissue disorders: Secondary | ICD-10-CM | POA: Diagnosis not present

## 2017-01-14 DIAGNOSIS — F339 Major depressive disorder, recurrent, unspecified: Secondary | ICD-10-CM

## 2017-01-14 DIAGNOSIS — Z87891 Personal history of nicotine dependence: Secondary | ICD-10-CM

## 2017-01-14 DIAGNOSIS — Z818 Family history of other mental and behavioral disorders: Secondary | ICD-10-CM

## 2017-01-14 DIAGNOSIS — Z8673 Personal history of transient ischemic attack (TIA), and cerebral infarction without residual deficits: Secondary | ICD-10-CM

## 2017-01-14 DIAGNOSIS — M542 Cervicalgia: Secondary | ICD-10-CM | POA: Diagnosis not present

## 2017-01-14 DIAGNOSIS — G4700 Insomnia, unspecified: Secondary | ICD-10-CM

## 2017-01-14 MED ORDER — DULOXETINE HCL 30 MG PO CPEP: 30.0000 mg | ORAL_CAPSULE | Freq: Every day | ORAL | 2 refills | Status: DC

## 2017-01-14 NOTE — Progress Notes (Signed)
Psychiatric Initial Adult Assessment   Patient Identification: [name] [name] MRN:  [id number] Date of Evaluation:  01/14/2017 Referral Source: Dr. Harl Bowie, Cardiology Chief Complaint:   Chief Complaint    Depression; Anxiety; Establish Care     Visit Diagnosis:    ICD-10-CM   1. Major depression, recurrent, chronic (HCC) F33.9     History of Present Illness:  This patient is a 66 year old married white male male who lives with his wife in [place]. He has to daughters 2 granddaughters and one great-granddaughter. 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 He went out on disability 10 years ago due to back problems  The patient was referred by his cardiologist for further assessment of depression and anxiety.  The patient is a rather long-winded historian. 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 He's had a series of health problems particularly with cardiovascular disease requiring stents. He's had a stroke. He also has adult-onset diabetes degenerative disc disease obesity and elevated lipids. He had to go out of work about 10 years ago because his back got so bad he could and continued to do his job. He had depression on and off through his life dealing with numerous family members were difficult. He has been married 4 times in the 3 previous wives are very difficult to get along with. His current wife he claims is been "a Therapist, sports."  The patient states that since he has had to stop working he's not felt very good about himself. He used to be very busy and active. He feels "useless." He has conflicts with his adult daughter who used to be on drugs but is now very demanding and is constantly asking for money. He is chronically in pain particular he has back his legs and he has severe neuropathy in his hands and feet. He claims  he is depressed but he jokes around so much it's difficult to tell how he really feels. He was started on Xanax a few years ago when he was going through his last divorce and it has helped his anxiety. He states that he can't sleep without it but as it is the only sleeps about 4 hours wakes up and eventually goes back to sleep. He had his wife spend all day together and they go out to eat quite a bit and he has friends at the restaurants. He's pretty active in church. He is been more depressed lately because his mother died of Alzheimer's disease a month ago. He states that his 38 year old father is still doing well.  In terms of symptoms he endorses sad mood poor sleep low energy sweating spells and low motivation. He has never had suicidal ideation or psychotic symptoms he's never had previous psychiatric treatment or hospitalization. He claims that he used to drink but stopped in 1975 when he "got saved." He smoked until fairly recently. He does not use drugs  Associated Signs/Symptoms: Depression Symptoms:  depressed mood, anhedonia, psychomotor retardation, fatigue, feelings of worthlessness/guilt, (Hypo) Manic Symptoms:  Irritable Mood, Anxiety Symptoms:  Excessive Worry, Psychotic Symptoms:  PTSD Symptoms:   Past Psychiatric History: He's been on Celexa which causes drowsiness. Zoloft has done the same thing and he is supposed to be taking 100 mg daily but he won't take it. Xanax 0.5 mg twice a day  seems to help his anxiety  Previous Psychotropic Medications: Yes   Substance Abuse History in the last 12 months:  No.  Consequences of Substance Abuse: NA  Past Medical History:  Past Medical History:  Diagnosis Date  . Anxiety   . AODM   . Arthritis   . CAD (coronary artery disease)    a. 2010: DES to CTO of RCA. EF 55% c. 07/2016: cath showing total occlusion within previously placed RCA stent (collaterals present), severe stenosis along LCx and OM1 (treated with 2 overlapping  DES).   . Cellulitis and abscess rt groin   . CHEST PAIN-UNSPECIFIED   . Depression   . Diabetes mellitus   . Disorders of iron metabolism   . DISORDERS OF IRON METABOLISM   . GERD (gastroesophageal reflux disease)   . Hyperlipidemia   . Hypertension   . Low serum testosterone level   . Medically noncompliant   . MURMUR   . Myocardial infarction [facility])     Past Surgical History:  Procedure Laterality Date  . BACK SURGERY  2015   ACDF by Dr. [name]  . COLONOSCOPY N/A 10/01/2014   Procedure: COLONOSCOPY;  Surgeon: Daneil Dolin, MD;  [facility];  Service: Endoscopy;  Laterality: N/A;  815  . CORONARY STENT INTERVENTION N/A 07/30/2016   Procedure: Coronary Stent Intervention;  Surgeon: [name], MD;  Location: [place] [facility];  Service: Cardiovascular;  Laterality: N/A;  . CORONARY STENT PLACEMENT  2000   By Dr. Olevia Perches  . EP IMPLANTABLE DEVICE N/A 05/25/2016   Procedure: Loop Recorder Insertion;  Surgeon: [name], MD;  Location: [place] [facility];  Service: Cardiovascular;  Laterality: N/A;  . ESOPHAGOGASTRODUODENOSCOPY     [facility] at [facility]  . ESOPHAGOGASTRODUODENOSCOPY N/A 10/01/2014   Procedure: ESOPHAGOGASTRODUODENOSCOPY (EGD);  Surgeon: Daneil Dolin, MD;  [facility];  Service: Endoscopy;  Laterality: N/A;  . HERNIA REPAIR  1829   umbilical  . LEFT HEART CATH AND CORONARY ANGIOGRAPHY N/A 07/30/2016   Procedure: Left Heart Cath and Coronary Angiography;  Surgeon: [name], MD;  Location: [place] [facility] LAB;  Service: Cardiovascular;  Laterality: N/A;  . LESION REMOVAL     Lip and hand   . NECK SURGERY      Family Psychiatric History: There is quite a bit of dementia on the maternal side of the family. His sister has a history of depression  Family History:  Family History  Problem Relation Age of Onset  . Diabetes Father   . Valvular heart disease Father   . Arthritis Father   . Heart disease Father   .  Stroke Father   . Alzheimer's disease Mother   . Hyperlipidemia Mother   . Hypertension Mother   . Arthritis Mother   . Lung cancer Mother   . Stroke Mother   . Arthritis/Rheumatoid Sister   . Diabetes Sister   . Hypertension Sister   . Hyperlipidemia Sister   . Depression Sister   . Dementia Maternal Aunt   . Dementia Maternal Uncle   . Heart disease Maternal Uncle   . Stomach cancer Paternal Uncle   . Colon cancer Neg Hx   . Liver disease Neg Hx     Social History:   Social History   Social History  . Marital status: Married    Spouse name: N/A  . Number of children: 2  . Years of education: N/A   Occupational History  .  retired    Social History Main Topics  . Smoking status: Former Smoker    Packs/day: 0.25    Years: 51.00    Types: Cigarettes    Quit date: 08/14/2016  . Smokeless tobacco: Never Used     Comment: smokes  a pack a week  . Alcohol use No  . Drug use: No  . Sexual activity: Yes   Other Topics Concern  . None   Social History Narrative   Lives with his wife.  Retired.  Unable to afford medicines.      Additional Social History: The patient grew up in [facility]. He grew up with both parents and 2 younger sisters. He states that his mother was very supportive but his father was extremely strict but he "respected him." He finished high school but worked his whole life until about 10 years ago on numerous jobs as outlined above. He has been married 4 times  Allergies:   Allergies  Allergen Reactions  . Shellfish Allergy Anaphylaxis and Other (See Comments)    Tongue swelling, hives  . Ace Inhibitors Other (See Comments) and Cough    CKD, renal failure   . Invokana [Canagliflozin] Other (See Comments)    Syncope / dehydration  . Metformin And Related Itching  . Pravastatin Sodium Other (See Comments)    myalgias  . Fenofibrate Other (See Comments)    Body aches  . Iodine Other (See Comments)    ????  . Lisinopril Cough  . Livalo  [Pitavastatin] Other (See Comments)    Myalgias   . Repatha [Evolocumab]     Myalgias, flu like sx  . Crestor [Rosuvastatin] Other (See Comments)    Myalgias   . Horse-Derived Products Rash  . Lexapro [Escitalopram Oxalate] Other (See Comments)    Buzzing in ears,headache, felt like a zombie  . Lipitor [Atorvastatin] Other (See Comments)    myalgias    Metabolic Disorder Labs: Lab Results  Component Value Date   HGBA1C 10.0 (H) 05/21/2016   MPG 240 05/21/2016   MPG 217 07/31/2014   No results found for: PROLACTIN Lab Results  Component Value Date   CHOL 242 (H) 11/26/2016   TRIG 271 (H) 11/26/2016   HDL 31 (L) 11/26/2016   CHOLHDL 7.8 (H) 11/26/2016   VLDL 51 (H) 07/31/2016   LDLCALC 157 (H) 11/26/2016   LDLCALC 122 (H) 08/26/2016     Current Medications: Current Outpatient Prescriptions  Medication Sig Dispense Refill  . ALPRAZolam (XANAX) 0.5 MG tablet Take 1 tablet (0.5 mg total) by mouth 2 (two) times daily as needed. 60 tablet 3  . apixaban (ELIQUIS) 5 MG TABS tablet Take 1 tablet (5 mg total) by mouth 2 (two) times daily. 60 tablet 6  . clopidogrel (PLAVIX) 75 MG tablet Take 1 tablet (75 mg total) by mouth daily with breakfast. 30 tablet 11  . Dulaglutide (TRULICITY) 1.5 QA/8.3MH SOPN Inject 1.5 mg into the skin as directed. Every friday 12 pen 2  . fenofibrate 160 MG tablet TAKE ONE TABLET BY MOUTH ONCE DAILY FOR CHOLESTEROL AND  TRIGLYCERIDE 90 tablet 0  . furosemide (LASIX) 20 MG tablet TAKE ONE TABLET BY MOUTH ONCE DAILY AS NEEDED 90 tablet 0  . gabapentin (NEURONTIN) 300 MG capsule TAKE 1 CAPSULE BY MOUTH THREE TIMES DAILY 270 capsule 0  . HYDROcodone-acetaminophen (NORCO) 10-325 MG tablet Take 1-2 tablets by mouth every 8 (eight) hours as needed for moderate pain.     Marland Kitchen insulin NPH-regular Human (NOVOLIN  70/30) (70-30) 100 UNIT/ML injection Inject 90 Units into the skin 2 (two) times daily with a meal.     . isosorbide mononitrate (IMDUR) 30 MG 24 hr tablet  Take 30 mg by mouth daily.     . Lactobacillus (PROBIOTIC ACIDOPHILUS PO) Take 1 capsule by mouth daily.    Marland Kitchen linaclotide (LINZESS) 72 MCG capsule Take 1 capsule (72 mcg total) by mouth daily before breakfast. (Patient taking differently: Take 72 mcg by mouth daily as needed. ) 30 capsule 3  . metoprolol tartrate (LOPRESSOR) 25 MG tablet Take 1 tablet (25 mg total) by mouth 2 (two) times daily. 180 tablet 3  . mometasone (NASONEX) 50 MCG/ACT nasal spray Place 2 sprays into the nose as needed.    . nitroGLYCERIN (NITROSTAT) 0.4 MG SL tablet Place 1 tablet (0.4 mg total) under the tongue every 5 (five) minutes x 3 doses as needed for chest pain. 25 tablet 2  . pantoprazole (PROTONIX) 40 MG tablet Take 40 mg by mouth daily.     . Red Yeast Rice 600 MG CAPS Take 1 capsule by mouth 2 (two) times daily.    Marland Kitchen telmisartan (MICARDIS) 40 MG tablet TAKE ONE TABLET BY MOUTH ONCE DAILY (REPLACING LISINORIL) 90 tablet 4  . DULoxetine (CYMBALTA) 30 MG capsule Take 1 capsule (30 mg total) by mouth daily. 30 capsule 2   No current facility-administered medications for this visit.     Neurologic: Headache: Yes Seizure: No Paresthesias:Yes  Musculoskeletal: Strength & Muscle Tone: decreased Gait & Station: unsteady Patient leans: N/A  Psychiatric Specialty Exam: Review of Systems  Constitutional: Positive for malaise/fatigue.  Cardiovascular: Positive for leg swelling.  Musculoskeletal: Positive for back pain and neck pain.  Neurological: Positive for tingling and headaches.  Psychiatric/Behavioral: Positive for depression. The patient is nervous/anxious and has insomnia.     Blood pressure (!) 120/92, pulse (!) 103, height 6\' 2"  (1.88 m), weight 291 lb (132 kg).Body mass index is 37.36 kg/m.  General Appearance: Casual and Fairly Groomed  Eye Contact:  Good  Speech:  Clear and Coherent  Volume:  Normal  Mood:  Anxious  Affect:  Appropriate  Thought Process:  Goal Directed  Orientation:  Full  (Time, Place, and Person)  Thought Content:  Rumination  Suicidal Thoughts:  No  Homicidal Thoughts:  No  Memory:  Immediate;   Good Recent;   Good Remote;   Good  Judgement:  Fair  Insight:  Fair  Psychomotor Activity:  Decreased  Concentration:  Concentration: Good and Attention Span: Good  Recall:  Good  Fund of Knowledge:Good  Language: Good  Akathisia:  No  Handed:  Right  AIMS (if indicated):    Assets:  Communication Skills Desire for Improvement Leisure Time Resilience Social Support Talents/Skills  ADL's:  Intact  Cognition: WNL  Sleep:  poor    Treatment Plan Summary: Medication management   This patient is a 66 year old male with a history of significant cardiovascular disease stroke MI stents diabetes degenerative disc disease neuropathy depression and anxiety. He is not been very compliant with antidepressant medication. I explained that these medicines don't work unless she takes them daily. He is reluctantly willing to try Cymbalta which should help with his depression and chronic pain. Given his sensitivity to medication the past we'll start with 30 mg at bedtime. He can continue the Xanax 0.5 mg twice a day for anxiety or sleep. He declines counseling but agrees to return to see me in 4 weeks  Levonne Spiller, MD 8/30/20183:52 PM

## 2017-01-18 ENCOUNTER — Other Ambulatory Visit: Payer: Self-pay | Admitting: Family

## 2017-01-20 ENCOUNTER — Ambulatory Visit (INDEPENDENT_AMBULATORY_CARE_PROVIDER_SITE_OTHER): Payer: Medicare Other | Admitting: *Deleted

## 2017-01-20 DIAGNOSIS — I639 Cerebral infarction, unspecified: Secondary | ICD-10-CM | POA: Diagnosis not present

## 2017-01-21 DIAGNOSIS — M9903 Segmental and somatic dysfunction of lumbar region: Secondary | ICD-10-CM | POA: Diagnosis not present

## 2017-01-21 DIAGNOSIS — M546 Pain in thoracic spine: Secondary | ICD-10-CM | POA: Diagnosis not present

## 2017-01-21 DIAGNOSIS — M9901 Segmental and somatic dysfunction of cervical region: Secondary | ICD-10-CM | POA: Diagnosis not present

## 2017-01-21 DIAGNOSIS — M9902 Segmental and somatic dysfunction of thoracic region: Secondary | ICD-10-CM | POA: Diagnosis not present

## 2017-01-21 DIAGNOSIS — M47812 Spondylosis without myelopathy or radiculopathy, cervical region: Secondary | ICD-10-CM | POA: Diagnosis not present

## 2017-01-21 NOTE — Progress Notes (Signed)
Carelink Summary Report / Loop Recorder 

## 2017-01-25 ENCOUNTER — Other Ambulatory Visit: Payer: Medicare Other

## 2017-01-25 DIAGNOSIS — M47812 Spondylosis without myelopathy or radiculopathy, cervical region: Secondary | ICD-10-CM | POA: Diagnosis not present

## 2017-01-25 DIAGNOSIS — M546 Pain in thoracic spine: Secondary | ICD-10-CM | POA: Diagnosis not present

## 2017-01-25 DIAGNOSIS — M9903 Segmental and somatic dysfunction of lumbar region: Secondary | ICD-10-CM | POA: Diagnosis not present

## 2017-01-25 DIAGNOSIS — M9902 Segmental and somatic dysfunction of thoracic region: Secondary | ICD-10-CM | POA: Diagnosis not present

## 2017-01-25 DIAGNOSIS — M9901 Segmental and somatic dysfunction of cervical region: Secondary | ICD-10-CM | POA: Diagnosis not present

## 2017-01-25 LAB — CUP PACEART REMOTE DEVICE CHECK
Date Time Interrogation Session: 20180905163807
Implantable Pulse Generator Implant Date: 20180108

## 2017-01-27 DIAGNOSIS — M9902 Segmental and somatic dysfunction of thoracic region: Secondary | ICD-10-CM | POA: Diagnosis not present

## 2017-01-27 DIAGNOSIS — M47812 Spondylosis without myelopathy or radiculopathy, cervical region: Secondary | ICD-10-CM | POA: Diagnosis not present

## 2017-01-27 DIAGNOSIS — M9901 Segmental and somatic dysfunction of cervical region: Secondary | ICD-10-CM | POA: Diagnosis not present

## 2017-01-27 DIAGNOSIS — M9903 Segmental and somatic dysfunction of lumbar region: Secondary | ICD-10-CM | POA: Diagnosis not present

## 2017-01-27 DIAGNOSIS — M546 Pain in thoracic spine: Secondary | ICD-10-CM | POA: Diagnosis not present

## 2017-01-28 ENCOUNTER — Encounter: Payer: Self-pay | Admitting: Internal Medicine

## 2017-01-28 ENCOUNTER — Ambulatory Visit (INDEPENDENT_AMBULATORY_CARE_PROVIDER_SITE_OTHER): Payer: Medicare Other | Admitting: Internal Medicine

## 2017-01-28 VITALS — BP 148/64 | HR 85 | Ht 75.0 in | Wt 290.0 lb

## 2017-01-28 DIAGNOSIS — I10 Essential (primary) hypertension: Secondary | ICD-10-CM

## 2017-01-28 MED ORDER — FUROSEMIDE 40 MG PO TABS: 40.0000 mg | ORAL_TABLET | Freq: Every day | ORAL | 3 refills | Status: DC

## 2017-01-28 NOTE — Progress Notes (Signed)
HPI [name] [name] returns today for ongoing followup of PAF, CAD, and syncope. He sustained an MI in March and underwent stenting of the RCA. He had preserved LV function. He has been bothered by knee and back pain. He is a bit depressed by his many medical problems. He feels palpitations. No syncope. Allergies  Allergen Reactions  . Shellfish Allergy Anaphylaxis and Other (See Comments)    Tongue swelling, hives  . Ace Inhibitors Other (See Comments) and Cough    CKD, renal failure   . Invokana [Canagliflozin] Other (See Comments)    Syncope / dehydration  . Metformin And Related Itching  . Pravastatin Sodium Other (See Comments)    myalgias  . Fenofibrate Other (See Comments)    Body aches  . Iodine Other (See Comments)    ????  . Lisinopril Cough  . Livalo [Pitavastatin] Other (See Comments)    Myalgias   . Repatha [Evolocumab]     Myalgias, flu like sx  . Crestor [Rosuvastatin] Other (See Comments)    Myalgias   . Horse-Derived Products Rash  . Lexapro [Escitalopram Oxalate] Other (See Comments)    Buzzing in ears,headache, felt like a zombie  . Lipitor [Atorvastatin] Other (See Comments)    myalgias     Current Outpatient Prescriptions  Medication Sig Dispense Refill  . apixaban (ELIQUIS) 5 MG TABS tablet Take 1 tablet (5 mg total) by mouth 2 (two) times daily. 60 tablet 6  . clopidogrel (PLAVIX) 75 MG tablet Take 1 tablet (75 mg total) by mouth daily with breakfast. 30 tablet 11  . Dulaglutide (TRULICITY) 1.5 GN/5.6OZ SOPN Inject 1.5 mg into the skin as directed. Every friday 12 pen 2  . DULoxetine (CYMBALTA) 30 MG capsule Take 1 capsule (30 mg total) by mouth daily. 30 capsule 2  . fenofibrate 160 MG tablet TAKE ONE TABLET BY MOUTH ONCE DAILY FOR CHOLESTEROL AND  TRIGLYCERIDE 90 tablet 0  . furosemide (LASIX) 20 MG tablet TAKE ONE TABLET BY MOUTH ONCE DAILY AS NEEDED 90 tablet 0  . gabapentin (NEURONTIN) 300 MG capsule TAKE 1 CAPSULE BY MOUTH THREE TIMES DAILY  270 capsule 0  . HYDROcodone-acetaminophen (NORCO) 10-325 MG tablet Take 1-2 tablets by mouth every 8 (eight) hours as needed for moderate pain.     Marland Kitchen insulin NPH-regular Human (NOVOLIN 70/30) (70-30) 100 UNIT/ML injection Inject 90 Units into the skin 2 (two) times daily with a meal.     . isosorbide mononitrate (IMDUR) 30 MG 24 hr tablet Take 30 mg by mouth daily.     . isosorbide mononitrate (IMDUR) 30 MG 24 hr tablet TAKE 1 TABLET BY MOUTH ONCE DAILY 90 tablet 0  . Lactobacillus (PROBIOTIC ACIDOPHILUS PO) Take 1 capsule by mouth daily.    Marland Kitchen linaclotide (LINZESS) 72 MCG capsule Take 1 capsule (72 mcg total) by mouth daily before breakfast. (Patient taking differently: Take 72 mcg by mouth daily as needed. ) 30 capsule 3  . metoprolol tartrate (LOPRESSOR) 25 MG tablet Take 1 tablet (25 mg total) by mouth 2 (two) times daily. 180 tablet 3  . mometasone (NASONEX) 50 MCG/ACT nasal spray Place 2 sprays into the nose as needed.    . nitroGLYCERIN (NITROSTAT) 0.4 MG SL tablet Place 1 tablet (0.4 mg total) under the tongue every 5 (five) minutes x 3 doses as needed for chest pain. 25 tablet 2  . pantoprazole (PROTONIX) 40 MG tablet Take 40 mg by mouth daily.     Marland Kitchen  telmisartan (MICARDIS) 40 MG tablet TAKE ONE TABLET BY MOUTH ONCE DAILY (REPLACING LISINORIL) 90 tablet 4   No current facility-administered medications for this visit.      Past Medical History:  Diagnosis Date  . Anxiety   . AODM   . Arthritis   . CAD (coronary artery disease)    a. 2010: DES to CTO of RCA. EF 55% c. 07/2016: cath showing total occlusion within previously placed RCA stent (collaterals present), severe stenosis along LCx and OM1 (treated with 2 overlapping DES).   . Cellulitis and abscess rt groin   . CHEST PAIN-UNSPECIFIED   . Depression   . Diabetes mellitus   . Disorders of iron metabolism   . DISORDERS OF IRON METABOLISM   . GERD (gastroesophageal reflux disease)   . Hyperlipidemia   . Hypertension   . Low  serum testosterone level   . Medically noncompliant   . MURMUR   . Myocardial infarction ([place])     ROS:   All systems reviewed and negative except as noted in the HPI.   Past Surgical History:  Procedure Laterality Date  . BACK SURGERY  2015   ACDF by Dr. [name]  . COLONOSCOPY N/A 10/01/2014   Procedure: COLONOSCOPY;  Surgeon: Daneil Dolin, MD;  [facility];  Service: Endoscopy;  Laterality: N/A;  815  . CORONARY STENT INTERVENTION N/A 07/30/2016   Procedure: Coronary Stent Intervention;  Surgeon: [name], MD;  Location: [place] [facility];  Service: Cardiovascular;  Laterality: N/A;  . CORONARY STENT PLACEMENT  2000   By Dr. Olevia Perches  . EP IMPLANTABLE DEVICE N/A 05/25/2016   Procedure: Loop Recorder Insertion;  Surgeon: [name], MD;  Location: [place] [facility] LAB;  Service: Cardiovascular;  Laterality: N/A;  . ESOPHAGOGASTRODUODENOSCOPY     esophagus stretched remotely at [facility]  . ESOPHAGOGASTRODUODENOSCOPY N/A 10/01/2014   Procedure: ESOPHAGOGASTRODUODENOSCOPY (EGD);  Surgeon: Daneil Dolin, MD;  [facility];  Service: Endoscopy;  Laterality: N/A;  . HERNIA REPAIR  1655   umbilical  . LEFT HEART CATH AND CORONARY ANGIOGRAPHY N/A 07/30/2016   Procedure: Left Heart Cath and Coronary Angiography;  Surgeon: [name], MD;  Location: [place] [facility];  Service: Cardiovascular;  Laterality: N/A;  . LESION REMOVAL     Lip and hand   . NECK SURGERY       Family History  Problem Relation Age of Onset  . Diabetes Father   . Valvular heart disease Father   . Arthritis Father   . Heart disease Father   . Stroke Father   . Alzheimer's disease Mother   . Hyperlipidemia Mother   . Hypertension Mother   . Arthritis Mother   . Lung cancer Mother   . Stroke Mother   . Arthritis/Rheumatoid Sister   . Diabetes Sister   . Hypertension Sister   . Hyperlipidemia Sister   . Depression Sister   . Dementia Maternal Aunt   . Dementia Maternal Uncle    . Heart disease Maternal Uncle   . Stomach cancer Paternal Uncle   . Colon cancer Neg Hx   . Liver disease Neg Hx      Social History   Social History  . Marital status: Married    Spouse name: N/A  . Number of children: 2  . Years of education: N/A   Occupational History  . retired    Social History Main Topics  . Smoking status: Former Smoker  Packs/day: 0.25    Years: 51.00    Types: Cigarettes    Quit date: 08/14/2016  . Smokeless tobacco: Never Used     Comment: smokes  a pack a week  . Alcohol use No  . Drug use: No  . Sexual activity: Yes   Other Topics Concern  . Not on file   Social History Narrative   Lives with his wife.  Retired.  Unable to afford medicines.       BP (!) 148/64 (BP Location: Left Arm)   Pulse 85   Ht 6\' 3"  (1.905 m)   Wt 290 lb (131.5 kg)   SpO2 95%   BMI 36.25 kg/m   Physical Exam:  Well appearing 66 yo man, NAD HEENT: Unremarkable Neck:  6 cm JVD, no thyromegally Lymphatics:  No adenopathy Back:  No CVA tenderness Lungs:  Clear with no wheezes HEART:  Regular rate rhythm, no murmurs, no rubs, no clicks Abd:  soft, positive bowel sounds, no organomegally, no rebound, no guarding Ext:  2 plus pulses, no edema, no cyanosis, no clubbing Skin:  No rashes no nodules Neuro:  CN II through XII intact, motor grossly intact   DEVICE  Normal device function.  See PaceArt for details. He is in NSR about 99% of the time.  Assess/Plan: 1. CAD - he is s/p stenting of the RCA. He will continue Plavix and Xarelto for now. No angina. 2. PAF - he has a prior stroke and will continue Xarelto. We also discussed other options.  3. Syncope - he has had none since his last visit. No bradycardia on his CXR. 4. Dyslipidemia - he is on no statin due to myalgias. He might be a candidate for the Pksk9 inhibitor.  [name] [name].D.

## 2017-01-28 NOTE — Patient Instructions (Signed)
Medication Instructions:  Your physician has recommended you make the following change in your medication: Increase Lasix to 40 mg daily    Labwork: Your physician recommends that you return for lab work in: 1-2 weeks    Testing/Procedures: NONE   Follow-Up: Your physician wants you to follow-up in: 1 Year with Dr. Lovena Le.  You will receive a reminder letter in the mail two months in advance. If you don't receive a letter, please call our office to schedule the follow-up appointment.   Any Other Special Instructions Will Be Listed Below (If Applicable).     If you need a refill on your cardiac medications before your next appointment, please call your pharmacy. Thank you for choosing [facility]!

## 2017-02-01 ENCOUNTER — Other Ambulatory Visit: Payer: Self-pay | Admitting: Family

## 2017-02-01 DIAGNOSIS — M9902 Segmental and somatic dysfunction of thoracic region: Secondary | ICD-10-CM | POA: Diagnosis not present

## 2017-02-01 DIAGNOSIS — M47812 Spondylosis without myelopathy or radiculopathy, cervical region: Secondary | ICD-10-CM | POA: Diagnosis not present

## 2017-02-01 DIAGNOSIS — M546 Pain in thoracic spine: Secondary | ICD-10-CM | POA: Diagnosis not present

## 2017-02-01 DIAGNOSIS — M9901 Segmental and somatic dysfunction of cervical region: Secondary | ICD-10-CM | POA: Diagnosis not present

## 2017-02-01 DIAGNOSIS — M9903 Segmental and somatic dysfunction of lumbar region: Secondary | ICD-10-CM | POA: Diagnosis not present

## 2017-02-09 LAB — CUP PACEART INCLINIC DEVICE CHECK
Date Time Interrogation Session: 20180925170735
Implantable Pulse Generator Implant Date: 20180108

## 2017-02-10 ENCOUNTER — Institutional Professional Consult (permissible substitution): Payer: Medicare Other | Admitting: Neurology

## 2017-02-10 ENCOUNTER — Telehealth: Payer: Self-pay | Admitting: Cardiology

## 2017-02-10 ENCOUNTER — Telehealth: Payer: Self-pay | Admitting: Internal Medicine

## 2017-02-10 NOTE — Telephone Encounter (Signed)
Spoke w/ pt and requested that he send a manual transmission b/c his home monitor has not updated in at least 14 days.   

## 2017-02-10 NOTE — Telephone Encounter (Signed)
Transmission received. [name] [name] aware that we needed this transmission in order to re-establish communication between the monitor, patient and clinic. He verbalizes understanding.

## 2017-02-10 NOTE — Telephone Encounter (Signed)
New message    Pt wife is calling asking for a call back from [place].

## 2017-02-11 ENCOUNTER — Ambulatory Visit (INDEPENDENT_AMBULATORY_CARE_PROVIDER_SITE_OTHER): Payer: Medicare Other | Admitting: Psychiatry

## 2017-02-11 ENCOUNTER — Encounter (HOSPITAL_COMMUNITY): Payer: Self-pay | Admitting: Psychiatry

## 2017-02-11 VITALS — BP 158/109 | HR 65 | Ht 75.0 in | Wt 286.6 lb

## 2017-02-11 DIAGNOSIS — R450 Nervousness: Secondary | ICD-10-CM

## 2017-02-11 DIAGNOSIS — Z87891 Personal history of nicotine dependence: Secondary | ICD-10-CM | POA: Diagnosis not present

## 2017-02-11 DIAGNOSIS — M542 Cervicalgia: Secondary | ICD-10-CM

## 2017-02-11 DIAGNOSIS — F419 Anxiety disorder, unspecified: Secondary | ICD-10-CM | POA: Diagnosis not present

## 2017-02-11 DIAGNOSIS — R5381 Other malaise: Secondary | ICD-10-CM

## 2017-02-11 DIAGNOSIS — R5383 Other fatigue: Secondary | ICD-10-CM | POA: Diagnosis not present

## 2017-02-11 DIAGNOSIS — M549 Dorsalgia, unspecified: Secondary | ICD-10-CM | POA: Diagnosis not present

## 2017-02-11 DIAGNOSIS — Z736 Limitation of activities due to disability: Secondary | ICD-10-CM

## 2017-02-11 DIAGNOSIS — R51 Headache: Secondary | ICD-10-CM | POA: Diagnosis not present

## 2017-02-11 DIAGNOSIS — Z810 Family history of intellectual disabilities: Secondary | ICD-10-CM

## 2017-02-11 DIAGNOSIS — F339 Major depressive disorder, recurrent, unspecified: Secondary | ICD-10-CM | POA: Diagnosis not present

## 2017-02-11 DIAGNOSIS — M2548 Effusion, other site: Secondary | ICD-10-CM

## 2017-02-11 DIAGNOSIS — G4700 Insomnia, unspecified: Secondary | ICD-10-CM | POA: Diagnosis not present

## 2017-02-11 MED ORDER — DULOXETINE HCL 60 MG PO CPEP: 60.0000 mg | ORAL_CAPSULE | Freq: Every day | ORAL | 2 refills | Status: DC

## 2017-02-11 NOTE — Progress Notes (Signed)
Psychiatric Initial Adult Assessment   Patient Identification: [name] [name] MRN:  [id number] Date of Evaluation:  02/11/2017 Referral Source: Dr. Harl Bowie, Cardiology Chief Complaint:   Chief Complaint    Depression; Follow-up     Visit Diagnosis:    ICD-10-CM   1. Major depression, recurrent, chronic (HCC) F33.9     History of Present Illness:  This patient is a 66 year old married white male male who lives with his wife in [place]. He has to daughters 2 granddaughters and one great-granddaughter. 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 He went out on disability 10 years ago due to back problems  The patient was referred by his cardiologist for further assessment of depression and anxiety.  The patient is a rather long-winded historian. 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 He's had a series of health problems particularly with cardiovascular disease requiring stents. He's had a stroke. He also has adult-onset diabetes degenerative disc disease obesity and elevated lipids. He had to go out of work about 10 years ago because his back got so bad he could and continued to do his job. He had depression on and off through his life dealing with numerous family members were difficult. He has been married 4 times in the 3 previous wives are very difficult to get along with. His current wife he claims is been "a Therapist, sports."  The patient states that since he has had to stop working he's not felt very good about himself. He used to be very busy and active. He feels "useless." He has conflicts with his adult daughter who used to be on drugs but is now very demanding and is constantly asking for money. He is chronically in pain particular he has back his legs and he has severe neuropathy in his hands and feet. He claims he is  depressed but he jokes around so much it's difficult to tell how he really feels. He was started on Xanax a few years ago when he was going through his last divorce and it has helped his anxiety. He states that he can't sleep without it but as it is the only sleeps about 4 hours wakes up and eventually goes back to sleep. He had his wife spend all day together and they go out to eat quite a bit and he has friends at the restaurants. He's pretty active in church. He is been more depressed lately because his mother died of Alzheimer's disease a month ago. He states that his 11 year old father is still doing well.  In terms of symptoms he endorses sad mood poor sleep low energy sweating spells and low motivation. He has never had suicidal ideation or psychotic symptoms he's never had previous psychiatric treatment or hospitalization. He claims that he used to drink but stopped in 1975 when he "got saved." He smoked until fairly recently. He does not use drugs  The patient is wife return after 4 weeks. He is now on Cymbalta 30 mg daily. He states that he feels a little bit better but doesn't think the dosages quite high enough. He is trying to be more active and has also cut down his food and has lost about 6 pounds. He still doesn't sleep well but claims that he has never slept well for his entire life. He  has totally cut out the Xanax and states that he feels much better without it. He denies being suicidal. He claims to be antisocial but yet he and his wife go to breakfast with friends every morning  Associated Signs/Symptoms: Depression Symptoms:  depressed mood, anhedonia, psychomotor retardation, fatigue, feelings of worthlessness/guilt, (Hypo) Manic Symptoms:  Irritable Mood, Anxiety Symptoms:  Excessive Worry, Psychotic Symptoms:  PTSD Symptoms:   Past Psychiatric History: He's been on Celexa which causes drowsiness. Zoloft has done the same thing and he is supposed to be taking 100 mg daily  but he won't take it. Xanax 0.5 mg twice a day seems to help his anxiety  Previous Psychotropic Medications: Yes   Substance Abuse History in the last 12 months:  No.  Consequences of Substance Abuse: NA  Past Medical History:  Past Medical History:  Diagnosis Date  . Anxiety   . AODM   . Arthritis   . CAD (coronary artery disease)    a. 2010: DES to CTO of RCA. EF 55% c. 07/2016: cath showing total occlusion within previously placed RCA stent (collaterals present), severe stenosis along LCx and OM1 (treated with 2 overlapping DES).   . Cellulitis and abscess rt groin   . CHEST PAIN-UNSPECIFIED   . Depression   . Diabetes mellitus   . Disorders of iron metabolism   . DISORDERS OF IRON METABOLISM   . GERD (gastroesophageal reflux disease)   . Hyperlipidemia   . Hypertension   . Low serum testosterone level   . Medically noncompliant   . MURMUR   . Myocardial infarction [facility])     Past Surgical History:  Procedure Laterality Date  . BACK SURGERY  2015   ACDF by Dr. [name]  . COLONOSCOPY N/A 10/01/2014   Procedure: COLONOSCOPY;  Surgeon: Daneil Dolin, MD;  [facility];  Service: Endoscopy;  Laterality: N/A;  815  . CORONARY STENT INTERVENTION N/A 07/30/2016   Procedure: Coronary Stent Intervention;  Surgeon: [name], MD;  Location: [place] [facility];  Service: Cardiovascular;  Laterality: N/A;  . CORONARY STENT PLACEMENT  2000   By Dr. Olevia Perches  . EP IMPLANTABLE DEVICE N/A 05/25/2016   Procedure: Loop Recorder Insertion;  Surgeon: [name], MD;  Location: [place] [facility];  Service: Cardiovascular;  Laterality: N/A;  . ESOPHAGOGASTRODUODENOSCOPY     esophagus stretched remotely at [facility]  . ESOPHAGOGASTRODUODENOSCOPY N/A 10/01/2014   Procedure: ESOPHAGOGASTRODUODENOSCOPY (EGD);  Surgeon: Daneil Dolin, MD;  [facility];  Service: Endoscopy;  Laterality: N/A;  . HERNIA REPAIR  9675   umbilical  . LEFT HEART CATH AND CORONARY ANGIOGRAPHY  N/A 07/30/2016   Procedure: Left Heart Cath and Coronary Angiography;  Surgeon: [name], MD;  Location: [place] [facility];  Service: Cardiovascular;  Laterality: N/A;  . LESION REMOVAL     Lip and hand   . NECK SURGERY      Family Psychiatric History: There is quite a bit of dementia on the maternal side of the family. His sister has a history of depression  Family History:  Family History  Problem Relation Age of Onset  . Diabetes Father   . Valvular heart disease Father   . Arthritis Father   . Heart disease Father   . Stroke Father   . Alzheimer's disease Mother   . Hyperlipidemia Mother   . Hypertension Mother   . Arthritis Mother   . Lung cancer Mother   . Stroke Mother   .  Arthritis/Rheumatoid Sister   . Diabetes Sister   . Hypertension Sister   . Hyperlipidemia Sister   . Depression Sister   . Dementia Maternal Aunt   . Dementia Maternal Uncle   . Heart disease Maternal Uncle   . Stomach cancer Paternal Uncle   . Colon cancer Neg Hx   . Liver disease Neg Hx     Social History:   Social History   Social History  . Marital status: Married    Spouse name: N/A  . Number of children: 2  . Years of education: N/A   Occupational History  . retired    Social History Main Topics  . Smoking status: Former Smoker    Packs/day: 0.25    Years: 51.00    Types: Cigarettes    Quit date: 08/14/2016  . Smokeless tobacco: Never Used     Comment: smokes  a pack a week  . Alcohol use No  . Drug use: No  . Sexual activity: Yes   Other Topics Concern  . None   Social History Narrative   Lives with his wife.  Retired.  Unable to afford medicines.      Additional Social History: The patient grew up in [place]. He grew up with both parents and 2 younger sisters. He states that his mother was very supportive but his father was extremely strict but he "respected him." He finished high school but worked his whole life until about 10 years ago on numerous jobs  as outlined above. He has been married 4 times  Allergies:   Allergies  Allergen Reactions  . Shellfish Allergy Anaphylaxis and Other (See Comments)    Tongue swelling, hives  . Ace Inhibitors Other (See Comments) and Cough    CKD, renal failure   . Invokana [Canagliflozin] Other (See Comments)    Syncope / dehydration  . Metformin And Related Itching  . Pravastatin Sodium Other (See Comments)    myalgias  . Fenofibrate Other (See Comments)    Body aches  . Iodine Other (See Comments)    ????  . Lisinopril Cough  . Livalo [Pitavastatin] Other (See Comments)    Myalgias   . Repatha [Evolocumab]     Myalgias, flu like sx  . Crestor [Rosuvastatin] Other (See Comments)    Myalgias   . Horse-Derived Products Rash  . Lexapro [Escitalopram Oxalate] Other (See Comments)    Buzzing in ears,headache, felt like a zombie  . Lipitor [Atorvastatin] Other (See Comments)    myalgias    Metabolic Disorder Labs: Lab Results  Component Value Date   HGBA1C 10.0 (H) 05/21/2016   MPG 240 05/21/2016   MPG 217 07/31/2014   No results found for: PROLACTIN Lab Results  Component Value Date   CHOL 242 (H) 11/26/2016   TRIG 271 (H) 11/26/2016   HDL 31 (L) 11/26/2016   CHOLHDL 7.8 (H) 11/26/2016   VLDL 51 (H) 07/31/2016   LDLCALC 157 (H) 11/26/2016   LDLCALC 122 (H) 08/26/2016     Current Medications: Current Outpatient Prescriptions  Medication Sig Dispense Refill  . apixaban (ELIQUIS) 5 MG TABS tablet Take 1 tablet (5 mg total) by mouth 2 (two) times daily. 60 tablet 6  . clopidogrel (PLAVIX) 75 MG tablet Take 1 tablet (75 mg total) by mouth daily with breakfast. 30 tablet 11  . Dulaglutide (TRULICITY) 1.5 YH/0.6CB SOPN Inject 1.5 mg into the skin as directed. Every friday 12 pen 2  . fenofibrate 160 MG tablet TAKE  ONE TABLET BY MOUTH ONCE DAILY FOR CHOLESTEROL AND  TRIGLYCERIDE 90 tablet 0  . furosemide (LASIX) 40 MG tablet Take 1 tablet (40 mg total) by mouth daily. 90 tablet 3   . gabapentin (NEURONTIN) 300 MG capsule TAKE 1 CAPSULE BY MOUTH THREE TIMES DAILY 270 capsule 0  . HYDROcodone-acetaminophen (NORCO) 10-325 MG tablet Take 1-2 tablets by mouth every 8 (eight) hours as needed for moderate pain.     Marland Kitchen insulin NPH-regular Human (NOVOLIN 70/30) (70-30) 100 UNIT/ML injection Inject 90 Units into the skin 2 (two) times daily with a meal.     . isosorbide mononitrate (IMDUR) 30 MG 24 hr tablet Take 30 mg by mouth daily.     . isosorbide mononitrate (IMDUR) 30 MG 24 hr tablet TAKE 1 TABLET BY MOUTH ONCE DAILY 90 tablet 0  . Lactobacillus (PROBIOTIC ACIDOPHILUS PO) Take 1 capsule by mouth daily.    Marland Kitchen linaclotide (LINZESS) 72 MCG capsule Take 1 capsule (72 mcg total) by mouth daily before breakfast. (Patient taking differently: Take 72 mcg by mouth daily as needed. ) 30 capsule 3  . metoprolol tartrate (LOPRESSOR) 25 MG tablet Take 1 tablet (25 mg total) by mouth 2 (two) times daily. 180 tablet 3  . mometasone (NASONEX) 50 MCG/ACT nasal spray Place 2 sprays into the nose as needed.    . nitroGLYCERIN (NITROSTAT) 0.4 MG SL tablet Place 1 tablet (0.4 mg total) under the tongue every 5 (five) minutes x 3 doses as needed for chest pain. 25 tablet 2  . NOVOLIN 70/30 RELION (70-30) 100 UNIT/ML injection INJECT 80 UNITS SUBCUTANEOUSLY TWICE DAILY WITH MEALS 50 mL 1  . pantoprazole (PROTONIX) 40 MG tablet Take 40 mg by mouth daily.     Marland Kitchen POTASSIUM PO Take by mouth daily.    Marland Kitchen telmisartan (MICARDIS) 40 MG tablet TAKE ONE TABLET BY MOUTH ONCE DAILY (REPLACING LISINORIL) 90 tablet 4  . DULoxetine (CYMBALTA) 60 MG capsule Take 1 capsule (60 mg total) by mouth daily. 30 capsule 2   No current facility-administered medications for this visit.     Neurologic: Headache: Yes Seizure: No Paresthesias:Yes  Musculoskeletal: Strength & Muscle Tone: decreased Gait & Station: unsteady Patient leans: N/A  Psychiatric Specialty Exam: Review of Systems  Constitutional: Positive for  malaise/fatigue.  Cardiovascular: Positive for leg swelling.  Musculoskeletal: Positive for back pain and neck pain.  Neurological: Positive for tingling and headaches.  Psychiatric/Behavioral: Positive for depression. The patient is nervous/anxious and has insomnia.     Blood pressure (!) 158/109, pulse 65, height 6\' 3"  (1.905 m), weight 286 lb 9.6 oz (130 kg), SpO2 94 %.Body mass index is 35.82 kg/m.  General Appearance: Casual and Fairly Groomed  Eye Contact:  Good  Speech:  Clear and Coherent  Volume:  Normal  Mood:Fairly good, a bit irritable   Affect: Somewhat constricted     Orientation:  Full (Time, Place, and Person)  Thought Content:  Rumination  Suicidal Thoughts:  No  Homicidal Thoughts:  No  Memory:  Immediate;   Good Recent;   Good Remote;   Good  Judgement:  Fair  Insight:  Fair  Psychomotor Activity:  Decreased  Concentration:  Concentration: Good and Attention Span: Good  Recall:  Good  Fund of Knowledge:Good  Language: Good  Akathisia:  No  Handed:  Right  AIMS (if indicated):    Assets:  Communication Skills Desire for Improvement Leisure Time Resilience Social Support Talents/Skills  ADL's:  Intact  Cognition: WNL  Sleep:  poor    Treatment Plan Summary: Medication management   A she will increase Cymbalta to 60 mg daily. I applauded his efforts to exercise more and cut down his food. He will return to see me in 4 weeks   Levonne Spiller, MD 9/27/20189:25 AM

## 2017-02-16 ENCOUNTER — Encounter: Payer: Self-pay | Admitting: *Deleted

## 2017-02-16 DIAGNOSIS — Z006 Encounter for examination for normal comparison and control in clinical research program: Secondary | ICD-10-CM

## 2017-02-16 NOTE — Progress Notes (Signed)
Late entry: Subject met inclusion and exclusion criteria. The informed consent form, study requirements and expectations were reviewed with the subject and questions and concerns were addressed prior to the signing of the consent form. The subject verbalized understanding of the trail requirements. The subject agreed to participate in theCLEARtrial and signed the informed consent. The informed consent was obtained prior to performance of any protocol-specific procedures for the subject. A copy of the signed informed consent was given to the subject and a copy was placed in the subject's medical record.   Jake Bathe, RN 02/09/2017 [place] version 4.1 Local version [phone]

## 2017-02-17 ENCOUNTER — Other Ambulatory Visit (HOSPITAL_COMMUNITY): Payer: Self-pay | Admitting: Psychiatry

## 2017-02-17 ENCOUNTER — Telehealth (HOSPITAL_COMMUNITY): Payer: Self-pay | Admitting: *Deleted

## 2017-02-17 MED ORDER — DULOXETINE HCL 30 MG PO CPEP: 30.0000 mg | ORAL_CAPSULE | Freq: Every day | ORAL | 0 refills | Status: DC

## 2017-02-17 NOTE — Telephone Encounter (Signed)
Pt would like to know if a new script could be sent to his pharmacy due to him currently taking capsules.

## 2017-02-17 NOTE — Telephone Encounter (Signed)
Yes, he may decrease duloxetine to 30 mg daily. Alternatively he may try to take it at night to see if it helps for daytime drowsiness. Also reminds him that it takes at least a few weeks for medication to be fully effective.

## 2017-02-17 NOTE — Telephone Encounter (Signed)
Pt called stating that his 60 mg Cymbalta is not working. Per pt he was taking 30 mg and provider increased to 60 mg. Per pt since he's been taking the 60 mg capsules, he sleeps all the time and can't seems to shake the sleepiness off. Per pt he would like to go back on the 30 mg. Informed pt provider is out of the office but message will be sent to other doctor and Dr. Harrington Challenger. Pt number is  [phone].

## 2017-02-17 NOTE — Telephone Encounter (Signed)
Done

## 2017-02-19 ENCOUNTER — Ambulatory Visit (INDEPENDENT_AMBULATORY_CARE_PROVIDER_SITE_OTHER): Payer: Medicare Other | Admitting: *Deleted

## 2017-02-19 DIAGNOSIS — I639 Cerebral infarction, unspecified: Secondary | ICD-10-CM | POA: Diagnosis not present

## 2017-02-22 LAB — CUP PACEART REMOTE DEVICE CHECK
Date Time Interrogation Session: 20181005173821
Implantable Pulse Generator Implant Date: 20180108

## 2017-02-22 NOTE — Telephone Encounter (Signed)
noted 

## 2017-02-22 NOTE — Progress Notes (Signed)
Carelink Summary Report / Loop Recorder 

## 2017-02-25 ENCOUNTER — Other Ambulatory Visit: Payer: Self-pay | Admitting: Internal Medicine

## 2017-02-25 ENCOUNTER — Other Ambulatory Visit: Payer: Self-pay | Admitting: Family

## 2017-02-25 DIAGNOSIS — I480 Paroxysmal atrial fibrillation: Secondary | ICD-10-CM

## 2017-03-09 ENCOUNTER — Encounter: Payer: Self-pay | Admitting: *Deleted

## 2017-03-09 ENCOUNTER — Ambulatory Visit (INDEPENDENT_AMBULATORY_CARE_PROVIDER_SITE_OTHER): Payer: Medicare Other | Admitting: Family

## 2017-03-09 ENCOUNTER — Encounter: Payer: Self-pay | Admitting: Family

## 2017-03-09 VITALS — BP 139/82 | HR 64 | Temp 96.8°F | Ht 75.0 in | Wt 281.2 lb

## 2017-03-09 DIAGNOSIS — E1169 Type 2 diabetes mellitus with other specified complication: Secondary | ICD-10-CM

## 2017-03-09 DIAGNOSIS — E1142 Type 2 diabetes mellitus with diabetic polyneuropathy: Secondary | ICD-10-CM | POA: Diagnosis not present

## 2017-03-09 DIAGNOSIS — I10 Essential (primary) hypertension: Secondary | ICD-10-CM | POA: Diagnosis not present

## 2017-03-09 DIAGNOSIS — I252 Old myocardial infarction: Secondary | ICD-10-CM | POA: Diagnosis not present

## 2017-03-09 DIAGNOSIS — K5900 Constipation, unspecified: Secondary | ICD-10-CM | POA: Diagnosis not present

## 2017-03-09 DIAGNOSIS — E785 Hyperlipidemia, unspecified: Secondary | ICD-10-CM | POA: Diagnosis not present

## 2017-03-09 DIAGNOSIS — F331 Major depressive disorder, recurrent, moderate: Secondary | ICD-10-CM

## 2017-03-09 DIAGNOSIS — Z87891 Personal history of nicotine dependence: Secondary | ICD-10-CM | POA: Diagnosis not present

## 2017-03-09 DIAGNOSIS — K219 Gastro-esophageal reflux disease without esophagitis: Secondary | ICD-10-CM

## 2017-03-09 DIAGNOSIS — F419 Anxiety disorder, unspecified: Secondary | ICD-10-CM | POA: Diagnosis not present

## 2017-03-09 DIAGNOSIS — Z794 Long term (current) use of insulin: Secondary | ICD-10-CM

## 2017-03-09 DIAGNOSIS — Z006 Encounter for examination for normal comparison and control in clinical research program: Secondary | ICD-10-CM

## 2017-03-09 LAB — BAYER DCA HB A1C WAIVED: HB A1C (BAYER DCA - WAIVED): 7.8 % — ABNORMAL HIGH (ref <7.0)

## 2017-03-09 NOTE — Patient Instructions (Signed)
Diabetes Mellitus and Food It is important for you to manage your blood sugar (glucose) level. Your blood glucose level can be greatly affected by what you eat. Eating healthier foods in the appropriate amounts throughout the day at about the same time each day will help you control your blood glucose level. It can also help slow or prevent worsening of your diabetes mellitus. Healthy eating may even help you improve the level of your blood pressure and reach or maintain a healthy weight. General recommendations for healthful eating and cooking habits include:  Eating meals and snacks regularly. Avoid going long periods of time without eating to lose weight.  Eating a diet that consists mainly of plant-based foods, such as fruits, vegetables, nuts, legumes, and whole grains.  Using low-heat cooking methods, such as baking, instead of high-heat cooking methods, such as deep frying.  Work with your dietitian to make sure you understand how to use the Nutrition Facts information on food labels. How can food affect me? Carbohydrates Carbohydrates affect your blood glucose level more than any other type of food. Your dietitian will help you determine how many carbohydrates to eat at each meal and teach you how to count carbohydrates. Counting carbohydrates is important to keep your blood glucose at a healthy level, especially if you are using insulin or taking certain medicines for diabetes mellitus. Alcohol Alcohol can cause sudden decreases in blood glucose (hypoglycemia), especially if you use insulin or take certain medicines for diabetes mellitus. Hypoglycemia can be a life-threatening condition. Symptoms of hypoglycemia (sleepiness, dizziness, and disorientation) are similar to symptoms of having too much alcohol. If your health care provider has given you approval to drink alcohol, do so in moderation and use the following guidelines:  Women should not have more than one drink per day, and men  should not have more than two drinks per day. One drink is equal to: ? 12 oz of beer. ? 5 oz of wine. ? 1 oz of hard liquor.  Do not drink on an empty stomach.  Keep yourself hydrated. Have water, diet soda, or unsweetened iced tea.  Regular soda, juice, and other mixers might contain a lot of carbohydrates and should be counted.  What foods are not recommended? As you make food choices, it is important to remember that all foods are not the same. Some foods have fewer nutrients per serving than other foods, even though they might have the same number of calories or carbohydrates. It is difficult to get your body what it needs when you eat foods with fewer nutrients. Examples of foods that you should avoid that are high in calories and carbohydrates but low in nutrients include:  Trans fats (most processed foods list trans fats on the Nutrition Facts label).  Regular soda.  Juice.  Candy.  Sweets, such as cake, pie, doughnuts, and cookies.  Fried foods.  What foods can I eat? Eat nutrient-rich foods, which will nourish your body and keep you healthy. The food you should eat also will depend on several factors, including:  The calories you need.  The medicines you take.  Your weight.  Your blood glucose level.  Your blood pressure level.  Your cholesterol level.  You should eat a variety of foods, including:  Protein. ? Lean cuts of meat. ? Proteins low in saturated fats, such as fish, egg whites, and beans. Avoid processed meats.  Fruits and vegetables. ? Fruits and vegetables that may help control blood glucose levels, such as apples,   mangoes, and yams.  Dairy products. ? Choose fat-free or low-fat dairy products, such as milk, yogurt, and cheese.  Grains, bread, pasta, and rice. ? Choose whole grain products, such as multigrain bread, whole oats, and brown rice. These foods may help control blood pressure.  Fats. ? Foods containing healthful fats, such as  nuts, avocado, olive oil, canola oil, and fish.  Does everyone with diabetes mellitus have the same meal plan? Because every person with diabetes mellitus is different, there is not one meal plan that works for everyone. It is very important that you meet with a dietitian who will help you create a meal plan that is just right for you. This information is not intended to replace advice given to you by your health care provider. Make sure you discuss any questions you have with your health care provider. Document Released: 01/29/2005 Document Revised: 10/10/2015 Document Reviewed: 03/31/2013 Elsevier Interactive Patient Education  2017 [employer]  

## 2017-03-09 NOTE — Progress Notes (Signed)
Late entry:  Subject to Research clinic for visit S2-W4 in the Clear Research Study.  No c/o, aes or saes to report. Physical performed by Dr Lia Foyer and subject started in the run-in phase of the trial.  Medication administered and appointment scheduled to return 03/29/17.  Subject re-consented to: [place] version 5.1 28Aug2018 Local version [phone]

## 2017-03-09 NOTE — Progress Notes (Signed)
Subjective:    Patient ID: [name] [name], male    DOB: [birth year], 66 y.o.   MRN: [id number]  PT presents to the office today for chronic follow up. PT had NSTEMI on 07/30/16 and stent placed. Pt is followed by Cardiologists every 4 months. Pt currently in a clinical trial for hyperlipidemia.   Pt is followed by Neurologists annually. Pt is followed by [facility] for anxiety and depression.  Hypertension  This is a chronic problem. The current episode started more than 1 year ago. The problem has been resolved since onset. The problem is controlled. Associated symptoms include anxiety, blurred vision and malaise/fatigue. Pertinent negatives include no peripheral edema or shortness of breath. Risk factors for coronary artery disease include dyslipidemia, diabetes mellitus, obesity, sedentary lifestyle, male gender and family history. The current treatment provides moderate improvement. Hypertensive end-organ damage includes CAD/MI and CVA. There is no history of kidney disease or heart failure.  Hyperlipidemia  This is a chronic problem. The current episode started more than 1 year ago. The problem is uncontrolled. Recent lipid tests were reviewed and are high. Exacerbating diseases include obesity. Pertinent negatives include no shortness of breath. The current treatment provides moderate improvement of lipids. Risk factors for coronary artery disease include dyslipidemia, diabetes mellitus, family history, male sex, obesity and a sedentary lifestyle.  Anxiety  Presents for follow-up visit. Symptoms include excessive worry, irritability and nervous/anxious behavior. Patient reports no shortness of breath. Symptoms occur occasionally.    Depression         This is a chronic problem.  The current episode started more than 1 year ago.   The onset quality is gradual.   The problem occurs intermittently.  Associated symptoms include irritable and sad.  Associated symptoms include no helplessness  and no hopelessness.  Compliance with treatment is good.  Past medical history includes anxiety.   Constipation  This is a chronic problem. The current episode started more than 1 year ago. The problem has been resolved since onset. His stool frequency is 1 time per day. He has tried fiber and laxatives for the symptoms. The treatment provided moderate relief.  Gastroesophageal Reflux  He reports no belching, no coughing or no heartburn. This is a chronic problem. The current episode started more than 1 year ago. Risk factors include obesity. He has tried a PPI for the symptoms. The treatment provided moderate relief.  Diabetes  He presents for his follow-up diabetic visit. He has type 2 diabetes mellitus. His disease course has been worsening. Hypoglycemia symptoms include nervousness/anxiousness. Associated symptoms include blurred vision and foot paresthesias. Pertinent negatives for diabetes include no visual change. There are no hypoglycemic complications. Symptoms are stable. Diabetic complications include a CVA, heart disease and peripheral neuropathy. Risk factors for coronary artery disease include dyslipidemia, diabetes mellitus, family history, male sex, obesity, hypertension and sedentary lifestyle. He is following a generally healthy diet. His breakfast blood glucose range is generally 130-140 mg/dl. An ACE inhibitor/angiotensin II receptor blocker is being taken. Eye exam is current.  Diabetic Neuropathy  PT reports bilateral numbness and tingling 8 out 10. Pt currently taking gabapentin.    Review of Systems  Constitutional: Positive for irritability and malaise/fatigue.  Eyes: Positive for blurred vision.  Respiratory: Negative for cough and shortness of breath.   Gastrointestinal: Positive for constipation. Negative for heartburn.  Psychiatric/Behavioral: Positive for depression. The patient is nervous/anxious.        Objective:   Physical Exam  Constitutional: He is  oriented  to person, place, and time. He appears well-developed and well-nourished. He is irritable. No distress.  Morbid obese  HENT:  Head: Normocephalic.  Right Ear: External ear normal.  Left Ear: External ear normal.  Nose: Nose normal.  Mouth/Throat: Oropharynx is clear and moist.  Eyes: Pupils are equal, round, and reactive to light. Right eye exhibits no discharge. Left eye exhibits no discharge.  Neck: Normal range of motion. Neck supple. No thyromegaly present.  Cardiovascular: Normal rate, regular rhythm, normal heart sounds and intact distal pulses.   No murmur heard. Pulmonary/Chest: Effort normal and breath sounds normal. No respiratory distress. He has no wheezes.  Abdominal: Soft. Bowel sounds are normal. He exhibits no distension. There is no tenderness.  Musculoskeletal: Normal range of motion. He exhibits no edema or tenderness.  Neurological: He is alert and oriented to person, place, and time.  Skin: Skin is warm and dry. No rash noted. No erythema.  Psychiatric: He has a normal mood and affect. His behavior is normal. Judgment and thought content normal.  Vitals reviewed.   See Diabetic foot note  BP 139/82   Pulse 64   Temp (!) 96.8 F (36 C) (Oral)   Ht '6\' 3"'$  (1.905 m)   Wt 281 lb 3.2 oz (127.6 kg)   BMI 35.15 kg/m      Assessment & Plan:  1. Gastroesophageal reflux disease without esophagitis - CMP14+EGFR  2. Essential hypertension, benign - Bayer DCA Hb A1c Waived - CMP14+EGFR  3. Hyperlipidemia associated with type 2 diabetes mellitus ([place]) - CMP14+EGFR  4. Morbid obesity ([place]) - CMP14+EGFR  5. Stopped smoking with greater than 30 pack year history - CMP14+EGFR  6. History of non-ST elevation myocardial infarction (NSTEMI) - CMP14+EGFR  7. Diabetic peripheral neuropathy associated with type 2 diabetes mellitus (HCC) - CMP14+EGFR  8. Diabetic polyneuropathy associated with type 2 diabetes mellitus (HCC) - CMP14+EGFR  9. Type 2 diabetes  mellitus with diabetic polyneuropathy, with long-term current use of insulin (HCC) - CMP14+EGFR  10. Constipation, unspecified constipation type - CMP14+EGFR  11. Moderate episode of recurrent major depressive disorder (HCC) - CMP14+EGFR  12. Anxiety - CMP14+EGFR   Continue all meds and keep all Specialists appts Labs pending Health Maintenance reviewed- Refuses all vaccines at this time Diet and exercise encouraged RTO 4 months   [name], FNP

## 2017-03-10 ENCOUNTER — Institutional Professional Consult (permissible substitution): Payer: Medicare Other | Admitting: Neurology

## 2017-03-10 ENCOUNTER — Other Ambulatory Visit: Payer: Self-pay | Admitting: Family

## 2017-03-10 DIAGNOSIS — E875 Hyperkalemia: Secondary | ICD-10-CM

## 2017-03-10 LAB — CMP14+EGFR
ALT: 34 IU/L (ref 0–44)
AST: 27 IU/L (ref 0–40)
Albumin/Globulin Ratio: 1.7 (ref 1.2–2.2)
Albumin: 4.7 g/dL (ref 3.6–4.8)
Alkaline Phosphatase: 65 IU/L (ref 39–117)
BUN/Creatinine Ratio: 21 (ref 10–24)
BUN: 35 mg/dL — ABNORMAL HIGH (ref 8–27)
Bilirubin Total: 0.6 mg/dL (ref 0.0–1.2)
CO2: 28 mmol/L (ref 20–29)
Calcium: 10.5 mg/dL — ABNORMAL HIGH (ref 8.6–10.2)
Chloride: 95 mmol/L — ABNORMAL LOW (ref 96–106)
Creatinine, Ser: 1.63 mg/dL — ABNORMAL HIGH (ref 0.76–1.27)
GFR calc Af Amer: 50 mL/min/{1.73_m2} — ABNORMAL LOW (ref >59)
GFR calc non Af Amer: 43 mL/min/{1.73_m2} — ABNORMAL LOW (ref >59)
Globulin, Total: 2.7 g/dL (ref 1.5–4.5)
Glucose: 211 mg/dL — ABNORMAL HIGH (ref 65–99)
Potassium: 5.8 mmol/L — ABNORMAL HIGH (ref 3.5–5.2)
Sodium: 138 mmol/L (ref 134–144)
Total Protein: 7.4 g/dL (ref 6.0–8.5)

## 2017-03-11 ENCOUNTER — Encounter (HOSPITAL_COMMUNITY): Payer: Self-pay | Admitting: Psychiatry

## 2017-03-11 ENCOUNTER — Ambulatory Visit (INDEPENDENT_AMBULATORY_CARE_PROVIDER_SITE_OTHER): Payer: Medicare Other | Admitting: Psychiatry

## 2017-03-11 VITALS — BP 90/52 | HR 65 | Ht 75.0 in | Wt 286.0 lb

## 2017-03-11 DIAGNOSIS — G629 Polyneuropathy, unspecified: Secondary | ICD-10-CM

## 2017-03-11 DIAGNOSIS — Z87891 Personal history of nicotine dependence: Secondary | ICD-10-CM

## 2017-03-11 DIAGNOSIS — Z818 Family history of other mental and behavioral disorders: Secondary | ICD-10-CM

## 2017-03-11 DIAGNOSIS — F339 Major depressive disorder, recurrent, unspecified: Secondary | ICD-10-CM | POA: Diagnosis not present

## 2017-03-11 DIAGNOSIS — Z79899 Other long term (current) drug therapy: Secondary | ICD-10-CM

## 2017-03-11 MED ORDER — DULOXETINE HCL 30 MG PO CPEP: 30.0000 mg | ORAL_CAPSULE | Freq: Every day | ORAL | 2 refills | Status: DC

## 2017-03-11 NOTE — Progress Notes (Signed)
[place] MD/PA/NP OP Progress Note  03/11/2017 8:38 AM ISSAI WERLING  MRN:  [id number]  Chief Complaint:  Chief Complaint    Depression; Anxiety; Follow-up     HPI:  This patient is a 66 year old married white male male who lives with his wife in [place]. He has to daughters 2 granddaughters and one great-granddaughter. 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 He went out on disability 10 years ago due to back problems  The patient was referred by his cardiologist for further assessment of depression and anxiety.  The patient is a rather long-winded historian. 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 He's had a series of health problems particularly with cardiovascular disease requiring stents. He's had a stroke. He also has adult-onset diabetes degenerative disc disease obesity and elevated lipids. He had to go out of work about 10 years ago because his back got so bad he could and continued to do his job. He had depression on and off through his life dealing with numerous family members were difficult. He has been married 4 times in the 3 previous wives are very difficult to get along with. His current wife he claims is been "a Therapist, sports."  The patient states that since he has had to stop working he's not felt very good about himself. He used to be very busy and active. He feels "useless." He has conflicts with his adult daughter who used to be on drugs but is now very demanding and is constantly asking for money. He is chronically in pain particular he has back his legs and he has severe neuropathy in his hands and feet. He claims he is depressed but he jokes around so much it's difficult to tell how he really feels. He was started on Xanax a few years ago when he was going through his last divorce and it has helped his anxiety.  He states that he can't sleep without it but as it is the only sleeps about 4 hours wakes up and eventually goes back to sleep. He had his wife spend all day together and they go out to eat quite a bit and he has friends at the restaurants. He's pretty active in church. He is been more depressed lately because his mother died of Alzheimer's disease a month ago. He states that his 31 year old father is still doing well.  In terms of symptoms he endorses sad mood poor sleep low energy sweating spells and low motivation. He has never had suicidal ideation or psychotic symptoms he's never had previous psychiatric treatment or hospitalization. He claims that he used to drink but stopped in 1975 when he "got saved." He smoked until fairly recently. He does not use drugs  The patient is wife return after 4 weeks. In the interim he had called and stated that the 60 mg of Cymbalta made him extremely drowsy. He has gone back to 30 mg daily. Overall he is doing a little bit better. His mood has improved. Most of the time he sleeps okay. He was supposed to have a sleep study but he refused because "I don't like people watching me when I sleep." He has stayed active and his wife thinks he is less irritable. He denies suicidal ideation  Visit Diagnosis:    ICD-10-CM   1.  Major depression, recurrent, chronic (HCC) F33.9     Past Psychiatric History:none  Past Medical History:  Past Medical History:  Diagnosis Date  . Anxiety   . AODM   . Arthritis   . CAD (coronary artery disease)    a. 2010: DES to CTO of RCA. EF 55% c. 07/2016: cath showing total occlusion within previously placed RCA stent (collaterals present), severe stenosis along LCx and OM1 (treated with 2 overlapping DES).   . Cellulitis and abscess rt groin   . CHEST PAIN-UNSPECIFIED   . Depression   . Diabetes mellitus   . Disorders of iron metabolism   . DISORDERS OF IRON METABOLISM   . GERD (gastroesophageal reflux disease)   .  Hyperlipidemia   . Hypertension   . Low serum testosterone level   . Medically noncompliant   . MURMUR   . Myocardial infarction [facility])     Past Surgical History:  Procedure Laterality Date  . BACK SURGERY  2015   ACDF by Dr. [name]  . COLONOSCOPY N/A 10/01/2014   Procedure: COLONOSCOPY;  Surgeon: Daneil Dolin, MD;  [facility];  Service: Endoscopy;  Laterality: N/A;  815  . CORONARY STENT INTERVENTION N/A 07/30/2016   Procedure: Coronary Stent Intervention;  Surgeon: [name], MD;  Location: [place] [facility];  Service: Cardiovascular;  Laterality: N/A;  . CORONARY STENT PLACEMENT  2000   By Dr. Olevia Perches  . EP IMPLANTABLE DEVICE N/A 05/25/2016   Procedure: Loop Recorder Insertion;  Surgeon: [name], MD;  Location: [place] [facility];  Service: Cardiovascular;  Laterality: N/A;  . ESOPHAGOGASTRODUODENOSCOPY     esophagus stretched remotely at [facility]  . ESOPHAGOGASTRODUODENOSCOPY N/A 10/01/2014   Procedure: ESOPHAGOGASTRODUODENOSCOPY (EGD);  Surgeon: Daneil Dolin, MD;  [facility];  Service: Endoscopy;  Laterality: N/A;  . HERNIA REPAIR  2536   umbilical  . LEFT HEART CATH AND CORONARY ANGIOGRAPHY N/A 07/30/2016   Procedure: Left Heart Cath and Coronary Angiography;  Surgeon: [name], MD;  Location: [place] [facility];  Service: Cardiovascular;  Laterality: N/A;  . LESION REMOVAL     Lip and hand   . NECK SURGERY      Family Psychiatric History: Sister has a history of depression  Family History:  Family History  Problem Relation Age of Onset  . Diabetes Father   . Valvular heart disease Father   . Arthritis Father   . Heart disease Father   . Stroke Father   . Alzheimer's disease Mother   . Hyperlipidemia Mother   . Hypertension Mother   . Arthritis Mother   . Lung cancer Mother   . Stroke Mother   . Arthritis/Rheumatoid Sister   . Diabetes Sister   . Hypertension Sister   . Hyperlipidemia Sister   . Depression Sister   .  Dementia Maternal Aunt   . Dementia Maternal Uncle   . Heart disease Maternal Uncle   . Stomach cancer Paternal Uncle   . Colon cancer Neg Hx   . Liver disease Neg Hx     Social History:  Social History   Social History  . Marital status: Married    Spouse name: N/A  . Number of children: 2  . Years of education: N/A   Occupational History  . retired    Social History Main Topics  . Smoking status: Former Smoker    Packs/day: 0.25    Years: 51.00    Types: Cigarettes  Quit date: 08/14/2016  . Smokeless tobacco: Never Used     Comment: smokes  a pack a week  . Alcohol use No  . Drug use: No  . Sexual activity: Yes   Other Topics Concern  . None   Social History Narrative   Lives with his wife.  Retired.  Unable to afford medicines.      Allergies:  Allergies  Allergen Reactions  . Shellfish Allergy Anaphylaxis and Other (See Comments)    Tongue swelling, hives  . Ace Inhibitors Other (See Comments) and Cough    CKD, renal failure   . Invokana [Canagliflozin] Other (See Comments)    Syncope / dehydration  . Metformin And Related Itching  . Pravastatin Sodium Other (See Comments)    myalgias  . Fenofibrate Other (See Comments)    Body aches  . Iodine Other (See Comments)    ????  . Lisinopril Cough  . Livalo [Pitavastatin] Other (See Comments)    Myalgias   . Repatha [Evolocumab]     Myalgias, flu like sx  . Crestor [Rosuvastatin] Other (See Comments)    Myalgias   . Horse-Derived Products Rash  . Lexapro [Escitalopram Oxalate] Other (See Comments)    Buzzing in ears,headache, felt like a zombie  . Lipitor [Atorvastatin] Other (See Comments)    myalgias    Metabolic Disorder Labs: Lab Results  Component Value Date   HGBA1C 10.0 (H) 05/21/2016   MPG 240 05/21/2016   MPG 217 07/31/2014   No results found for: PROLACTIN Lab Results  Component Value Date   CHOL 242 (H) 11/26/2016   TRIG 271 (H) 11/26/2016   HDL 31 (L) 11/26/2016   CHOLHDL  7.8 (H) 11/26/2016   VLDL 51 (H) 07/31/2016   LDLCALC 157 (H) 11/26/2016   LDLCALC 122 (H) 08/26/2016   Lab Results  Component Value Date   TSH 1.575 05/21/2016   TSH 2.840 02/11/2016    Therapeutic Level Labs: No results found for: LITHIUM No results found for: VALPROATE No components found for:  CBMZ  Current Medications: Current Outpatient Prescriptions  Medication Sig Dispense Refill  . clopidogrel (PLAVIX) 75 MG tablet Take 1 tablet (75 mg total) by mouth daily with breakfast. 30 tablet 11  . Dulaglutide (TRULICITY) 1.5 GH/8.2XH SOPN Inject 1.5 mg into the skin as directed. Every friday 12 pen 2  . DULoxetine (CYMBALTA) 30 MG capsule Take 1 capsule (30 mg total) by mouth daily. 30 capsule 2  . ELIQUIS 5 MG TABS tablet TAKE 1 TABLET BY MOUTH TWICE DAILY 180 tablet 1  . fenofibrate 160 MG tablet TAKE 1 TABLET BY MOUTH ONCE DAILY FOR CHOLESTEROL AND TRIGLYCERIDE 90 tablet 0  . furosemide (LASIX) 40 MG tablet Take 1 tablet (40 mg total) by mouth daily. 90 tablet 3  . gabapentin (NEURONTIN) 300 MG capsule TAKE 1 CAPSULE BY MOUTH THREE TIMES DAILY 270 capsule 0  . HYDROcodone-acetaminophen (NORCO) 10-325 MG tablet Take 1-2 tablets by mouth every 8 (eight) hours as needed for moderate pain.     Marland Kitchen insulin NPH-regular Human (NOVOLIN 70/30) (70-30) 100 UNIT/ML injection Inject 90 Units into the skin 2 (two) times daily with a meal.     . isosorbide mononitrate (IMDUR) 30 MG 24 hr tablet TAKE 1 TABLET BY MOUTH ONCE DAILY 90 tablet 0  . Lactobacillus (PROBIOTIC ACIDOPHILUS PO) Take 1 capsule by mouth daily.    Marland Kitchen linaclotide (LINZESS) 72 MCG capsule Take 1 capsule (72 mcg total) by mouth daily before breakfast. (  Patient taking differently: Take 72 mcg by mouth daily as needed. ) 30 capsule 3  . metoprolol tartrate (LOPRESSOR) 25 MG tablet Take 1 tablet (25 mg total) by mouth 2 (two) times daily. 180 tablet 3  . mometasone (NASONEX) 50 MCG/ACT nasal spray Place 2 sprays into the nose as  needed.    . nitroGLYCERIN (NITROSTAT) 0.4 MG SL tablet Place 1 tablet (0.4 mg total) under the tongue every 5 (five) minutes x 3 doses as needed for chest pain. 25 tablet 2  . pantoprazole (PROTONIX) 40 MG tablet Take 40 mg by mouth daily.     Marland Kitchen POTASSIUM PO Take by mouth daily.    Marland Kitchen telmisartan (MICARDIS) 40 MG tablet TAKE ONE TABLET BY MOUTH ONCE DAILY (REPLACING LISINORIL) 90 tablet 4   No current facility-administered medications for this visit.      Musculoskeletal: Strength & Muscle Tone: within normal limits Gait & Station: unsteady Patient leans: N/A  Psychiatric Specialty Exam: Review of Systems  Musculoskeletal: Positive for back pain, joint pain and neck pain.  Psychiatric/Behavioral: Positive for depression.  All other systems reviewed and are negative.   Blood pressure (!) 90/52, pulse 65, height 6\' 3"  (1.905 m), weight 286 lb (129.7 kg).Body mass index is 35.75 kg/m.  General Appearance: Casual and Fairly Groomed  Eye Contact:  Good  Speech:  Clear and Coherent  Volume:  Normal  Mood:  Euthymic  Affect:  Appropriate  Thought Process:  Goal Directed  Orientation:  Full (Time, Place, and Person)  Thought Content: WDL   Suicidal Thoughts:  No  Homicidal Thoughts:  No  Memory:  Immediate;   Good Recent;   Good Remote;   Fair  Judgement:  Fair  Insight:  Lacking  Psychomotor Activity:  Normal  Concentration:  Concentration: Good and Attention Span: Good  Recall:  Good  Fund of Knowledge: Good  Language: Good  Akathisia:  No  Handed:  Right  AIMS (if indicated): not done  Assets:  Communication Skills Desire for Improvement Resilience Social Support Talents/Skills  ADL's:  Intact  Cognition: WNL  Sleep:  Fair   Screenings: GAD-7     Office Visit from 09/17/2015 in [place]  Total GAD-7 Score  16    Mini-Mental     Clinical Support from 12/11/2015 in [place] Visit from 08/01/2014 in [place]  Total Score (max 30 points )  30  27    PHQ2-9     Office Visit from 03/09/2017 in [place] from 12/15/2016 in [place] Visit from 11/26/2016 in [place] Office Visit from 09/28/2016 in [place] Office Visit from 08/26/2016 in [place]  PHQ-2 Total Score  0  0  0  0  1  PHQ-9 Total Score  -  3  -  -  -       Assessment and Plan: This patient states 66 year old white male with a history of depression. Much of this is secondary to his chronic medical problems. He seems to be doing somewhat better on Cymbalta 30 mg daily so this will be continued. He has declined offers for counseling here. He will continue his medication return to see me in 3 months   Levonne Spiller, MD 03/11/2017, 8:38 AM

## 2017-03-16 DIAGNOSIS — H524 Presbyopia: Secondary | ICD-10-CM | POA: Diagnosis not present

## 2017-03-16 DIAGNOSIS — H5203 Hypermetropia, bilateral: Secondary | ICD-10-CM | POA: Diagnosis not present

## 2017-03-16 DIAGNOSIS — Z794 Long term (current) use of insulin: Secondary | ICD-10-CM | POA: Diagnosis not present

## 2017-03-16 DIAGNOSIS — H52223 Regular astigmatism, bilateral: Secondary | ICD-10-CM | POA: Diagnosis not present

## 2017-03-16 LAB — HM DIABETES EYE EXAM

## 2017-03-22 ENCOUNTER — Other Ambulatory Visit: Payer: Self-pay | Admitting: Family

## 2017-03-22 ENCOUNTER — Ambulatory Visit (INDEPENDENT_AMBULATORY_CARE_PROVIDER_SITE_OTHER): Payer: Medicare Other | Admitting: *Deleted

## 2017-03-22 DIAGNOSIS — I639 Cerebral infarction, unspecified: Secondary | ICD-10-CM

## 2017-03-22 NOTE — Progress Notes (Signed)
Carelink Summary Report / Loop Recorder 

## 2017-03-23 LAB — CUP PACEART REMOTE DEVICE CHECK
Date Time Interrogation Session: 20181104210943
Implantable Pulse Generator Implant Date: 20180108

## 2017-04-10 ENCOUNTER — Other Ambulatory Visit: Payer: Self-pay | Admitting: Family

## 2017-04-12 ENCOUNTER — Other Ambulatory Visit: Payer: Self-pay | Admitting: Family

## 2017-04-13 ENCOUNTER — Telehealth: Payer: Self-pay | Admitting: *Deleted

## 2017-04-13 DIAGNOSIS — M4712 Other spondylosis with myelopathy, cervical region: Secondary | ICD-10-CM | POA: Diagnosis not present

## 2017-04-13 NOTE — Telephone Encounter (Signed)
Spoke with patient regarding symptom episode-- ECG appears AFib vs. SVT 130's-140's. Patient states he was SOB, dizzy, vision had a yellow tint, chest was tight and pain down his left arm. He states he took a nitroglycerin at 10:30 that relieved his SOB and at 1400 checked his BP- 85/35 and took a second one which relieved all other symptoms. Patient states his BP today was 131/68 and HR was 86, No chest tightness, no arm pain, no SOB. Advised patient will review episode with Dr. Lovena Le and will call him back with further recommendations. Patient verbalized understanding.

## 2017-04-16 ENCOUNTER — Encounter: Payer: Self-pay | Admitting: *Deleted

## 2017-04-16 DIAGNOSIS — Z006 Encounter for examination for normal comparison and control in clinical research program: Secondary | ICD-10-CM

## 2017-04-16 MED ORDER — METOPROLOL TARTRATE 50 MG PO TABS: 50.0000 mg | ORAL_TABLET | Freq: Two times a day (BID) | ORAL | 3 refills | Status: DC

## 2017-04-16 NOTE — Progress Notes (Signed)
Late entry:  Subject to research clinic on 03/31/2017.  He had no c/o, aes or saes to report.  Subject did not return run in bottle at this visit stated will return next visit.  He said he still had 3 left and would return pills and bottle at next scheduled visit.   Based on his statements would have been 97% compliant with meds and new drug dispensed.  Next visit scheduled for Apr 29, 2017.

## 2017-04-16 NOTE — Progress Notes (Signed)
Late entry: Subject to the Research clinic for visit T1-W0 in the [facility] study.  He did not have any complaints, stated no aes or saes to report.  He did not return his study meds at this visit, stated 3 left but will bring bottle back at next study.  Instructed not to take any of the 3 pills left and start his new bottle which was dispensed. Next scheduled appointment on 04/29/2017.

## 2017-04-16 NOTE — Telephone Encounter (Signed)
Reviewed symptom episode with Dr. Lovena Le, ECG appears ST vs AT vs AVNRT, recommends to INCREASE metoprolol 25 mg BID to 50 mg BID. Advised patient to monitor his BP for the first few days when he changes the dose. Patient verbalized understanding.

## 2017-04-20 ENCOUNTER — Ambulatory Visit (INDEPENDENT_AMBULATORY_CARE_PROVIDER_SITE_OTHER): Payer: Medicare Other | Admitting: *Deleted

## 2017-04-20 DIAGNOSIS — I639 Cerebral infarction, unspecified: Secondary | ICD-10-CM

## 2017-04-21 NOTE — Progress Notes (Signed)
Carelink Summary Report / Loop Recorder 

## 2017-04-28 ENCOUNTER — Other Ambulatory Visit: Payer: Self-pay | Admitting: Internal Medicine

## 2017-04-30 ENCOUNTER — Encounter: Payer: Self-pay | Admitting: *Deleted

## 2017-04-30 DIAGNOSIS — Z006 Encounter for examination for normal comparison and control in clinical research program: Secondary | ICD-10-CM

## 2017-04-30 LAB — CUP PACEART REMOTE DEVICE CHECK
Date Time Interrogation Session: 20181204214043
Implantable Pulse Generator Implant Date: 20180108

## 2017-04-30 NOTE — Progress Notes (Signed)
Patient to research clinic for visit T2-M1 in the [facility] Research study.  Patient was somewhat emotional over the recent death of his father.  He has changed his diet recently and not sure if he likes it, trying to be more health conscious. His wife was with him.  No c/o, aes or saes to report.  100% compliant with his medications.  His next appointment was scheduled and he was encouraged to call us if he needs anything.

## 2017-05-20 ENCOUNTER — Encounter: Payer: Self-pay | Admitting: Cardiology

## 2017-05-20 ENCOUNTER — Ambulatory Visit: Payer: Medicare Other | Admitting: Cardiology

## 2017-05-20 ENCOUNTER — Ambulatory Visit (INDEPENDENT_AMBULATORY_CARE_PROVIDER_SITE_OTHER): Payer: Medicare Other | Admitting: *Deleted

## 2017-05-20 VITALS — BP 142/88 | HR 69 | Wt 292.0 lb

## 2017-05-20 DIAGNOSIS — I639 Cerebral infarction, unspecified: Secondary | ICD-10-CM | POA: Diagnosis not present

## 2017-05-20 DIAGNOSIS — I4891 Unspecified atrial fibrillation: Secondary | ICD-10-CM

## 2017-05-20 DIAGNOSIS — Z79899 Other long term (current) drug therapy: Secondary | ICD-10-CM | POA: Diagnosis not present

## 2017-05-20 DIAGNOSIS — I2510 Atherosclerotic heart disease of native coronary artery without angina pectoris: Secondary | ICD-10-CM | POA: Diagnosis not present

## 2017-05-20 DIAGNOSIS — I10 Essential (primary) hypertension: Secondary | ICD-10-CM

## 2017-05-20 DIAGNOSIS — K219 Gastro-esophageal reflux disease without esophagitis: Secondary | ICD-10-CM

## 2017-05-20 MED ORDER — FUROSEMIDE 40 MG PO TABS: 40.0000 mg | ORAL_TABLET | Freq: Every day | ORAL | 3 refills | Status: DC | PRN

## 2017-05-20 NOTE — Progress Notes (Signed)
Carelink Summary Report / Loop Recorder 

## 2017-05-20 NOTE — Patient Instructions (Signed)
Medication Instructions:  TAKE LASIX 40 MG DAILY AS NEEDED FOR SWELLING    Labwork: BMET CBC  Testing/Procedures: NONE  Follow-Up: Your physician wants you to follow-up in: 4 MONTHS.  You will receive a reminder letter in the mail two months in advance. If you don't receive a letter, please call our office to schedule the follow-up appointment.   Any Other Special Instructions Will Be Listed Below (If Applicable). I HAVE SENT IN A REFERRAL TO DR. [name], someone from his office should contact you soon.     If you need a refill on your cardiac medications before your next appointment, please call your pharmacy.

## 2017-05-20 NOTE — Progress Notes (Signed)
Clinical Summary [name] is a 67 y.o.male seen today for follow up of the following medical problems.   1. CAD - history of coronary stenting stenting (DES to RCA in RCA CTO 2010 - last cath 07/2016 as reported below, received overlapping stents to LCX. Consider PCI to RCA if refractory angina.   - no recent chest pain. Breathing is up and down   2. PAF - once a month palpitations, typically does not last long.  - compliant with meds.  - has had some recent blood in stools off and on few months in commode.   3. HL - repatha reaction, flu like symptoms after - followed in lipid clinic   4. HTN - checks at home occasionally, can have some low bp's.   5. CVA - loop recorder followed by Dr Lovena Le - followed by neurology  6. AAA screen  - CT scan 08/2014 without aneurysm  7. Anxiety/depression - followed by behavioral health.   8. LE edema/Chronic diastolic HF - takes lasix every other day.  - he is not interested in compression stockings.    SH: parents recently passed    Past Medical History:  Diagnosis Date  . Anxiety   . AODM   . Arthritis   . CAD (coronary artery disease)    a. 2010: DES to CTO of RCA. EF 55% c. 07/2016: cath showing total occlusion within previously placed RCA stent (collaterals present), severe stenosis along LCx and OM1 (treated with 2 overlapping DES).   . Cellulitis and abscess rt groin   . CHEST PAIN-UNSPECIFIED   . Depression   . Diabetes mellitus   . Disorders of iron metabolism   . DISORDERS OF IRON METABOLISM   . GERD (gastroesophageal reflux disease)   . Hyperlipidemia   . Hypertension   . Low serum testosterone level   . Medically noncompliant   . MURMUR   . Myocardial infarction (HCC)      Allergies  Allergen Reactions  . Shellfish Allergy Anaphylaxis and Other (See Comments)    Tongue swelling, hives  . Ace Inhibitors Other (See Comments) and Cough    CKD, renal failure   . Invokana [Canagliflozin]  Other (See Comments)    Syncope / dehydration  . Metformin And Related Itching  . Pravastatin Sodium Other (See Comments)    myalgias  . Fenofibrate Other (See Comments)    Body aches  . Iodine Other (See Comments)    ????  . Lisinopril Cough  . Livalo [Pitavastatin] Other (See Comments)    Myalgias   . Repatha [Evolocumab]     Myalgias, flu like sx  . Crestor [Rosuvastatin] Other (See Comments)    Myalgias   . Horse-Derived Products Rash  . Lexapro [Escitalopram Oxalate] Other (See Comments)    Buzzing in ears,headache, felt like a zombie  . Lipitor [Atorvastatin] Other (See Comments)    myalgias     Current Outpatient Medications  Medication Sig Dispense Refill  . clopidogrel (PLAVIX) 75 MG tablet Take 1 tablet (75 mg total) by mouth daily with breakfast. 30 tablet 11  . Dulaglutide (TRULICITY) 1.5 TK/2.4OX SOPN Inject 1.5 mg into the skin as directed. Every friday 12 pen 2  . DULoxetine (CYMBALTA) 30 MG capsule Take 1 capsule (30 mg total) by mouth daily. 30 capsule 2  . ELIQUIS 5 MG TABS tablet TAKE 1 TABLET BY MOUTH TWICE DAILY 180 tablet 1  . fenofibrate 160 MG tablet TAKE 1 TABLET BY MOUTH ONCE  DAILY FOR CHOLESTEROL AND TRIGLYCERIDE 90 tablet 0  . furosemide (LASIX) 40 MG tablet Take 1 tablet (40 mg total) by mouth daily. 90 tablet 3  . gabapentin (NEURONTIN) 300 MG capsule TAKE 1 CAPSULE BY MOUTH THREE TIMES DAILY 270 capsule 0  . HYDROcodone-acetaminophen (NORCO) 10-325 MG tablet Take 1-2 tablets by mouth every 8 (eight) hours as needed for moderate pain.     Marland Kitchen insulin NPH-regular Human (NOVOLIN 70/30) (70-30) 100 UNIT/ML injection Inject 90 Units into the skin 2 (two) times daily with a meal.     . isosorbide mononitrate (IMDUR) 30 MG 24 hr tablet TAKE 1 TABLET BY MOUTH ONCE DAILY 90 tablet 0  . Lactobacillus (PROBIOTIC ACIDOPHILUS PO) Take 1 capsule by mouth daily.    Marland Kitchen linaclotide (LINZESS) 72 MCG capsule Take 1 capsule (72 mcg total) by mouth daily before  breakfast. (Patient taking differently: Take 72 mcg by mouth daily as needed. ) 30 capsule 3  . metoprolol tartrate (LOPRESSOR) 50 MG tablet Take 1 tablet (50 mg total) by mouth 2 (two) times daily. 180 tablet 3  . mometasone (NASONEX) 50 MCG/ACT nasal spray Place 2 sprays into the nose as needed.    . nitroGLYCERIN (NITROSTAT) 0.4 MG SL tablet Place 1 tablet (0.4 mg total) under the tongue every 5 (five) minutes x 3 doses as needed for chest pain. 25 tablet 2  . NOVOLIN 70/30 RELION (70-30) 100 UNIT/ML injection INJECT 80 UNITS SUBCUTANEOUSLY TWICE DAILY WITH MEALS 60 mL 1  . pantoprazole (PROTONIX) 40 MG tablet Take 40 mg by mouth daily.     Marland Kitchen telmisartan (MICARDIS) 40 MG tablet TAKE ONE TABLET BY MOUTH ONCE DAILY (REPLACING LISINORIL) 90 tablet 4   No current facility-administered medications for this visit.      Past Surgical History:  Procedure Laterality Date  . BACK SURGERY  2015   ACDF by Dr. [name]  . COLONOSCOPY N/A 10/01/2014   Procedure: COLONOSCOPY;  Surgeon: Daneil Dolin, MD;  Location: [facility];  Service: Endoscopy;  Laterality: N/A;  815  . CORONARY STENT INTERVENTION N/A 07/30/2016   Procedure: Coronary Stent Intervention;  Surgeon: [name], MD;  Location: [place] [facility];  Service: Cardiovascular;  Laterality: N/A;  . CORONARY STENT PLACEMENT  2000   By Dr. Olevia Perches  . EP IMPLANTABLE DEVICE N/A 05/25/2016   Procedure: Loop Recorder Insertion;  Surgeon: [name], MD;  Location: [place] [facility];  Service: Cardiovascular;  Laterality: N/A;  . ESOPHAGOGASTRODUODENOSCOPY     esophagus stretched remotely at [facility]  . ESOPHAGOGASTRODUODENOSCOPY N/A 10/01/2014   Procedure: ESOPHAGOGASTRODUODENOSCOPY (EGD);  Surgeon: Daneil Dolin, MD;  Location: [facility];  Service: Endoscopy;  Laterality: N/A;  . HERNIA REPAIR  6283   umbilical  . LEFT HEART CATH AND CORONARY ANGIOGRAPHY N/A 07/30/2016   Procedure: Left Heart Cath and Coronary Angiography;  Surgeon:  [name], MD;  Location: [place] [facility];  Service: Cardiovascular;  Laterality: N/A;  . LESION REMOVAL     Lip and hand   . NECK SURGERY       Allergies  Allergen Reactions  . Shellfish Allergy Anaphylaxis and Other (See Comments)    Tongue swelling, hives  . Ace Inhibitors Other (See Comments) and Cough    CKD, renal failure   . Invokana [Canagliflozin] Other (See Comments)    Syncope / dehydration  . Metformin And Related Itching  . Pravastatin Sodium Other (See Comments)    myalgias  .  Fenofibrate Other (See Comments)    Body aches  . Iodine Other (See Comments)    ????  . Lisinopril Cough  . Livalo [Pitavastatin] Other (See Comments)    Myalgias   . Repatha [Evolocumab]     Myalgias, flu like sx  . Crestor [Rosuvastatin] Other (See Comments)    Myalgias   . Horse-Derived Products Rash  . Lexapro [Escitalopram Oxalate] Other (See Comments)    Buzzing in ears,headache, felt like a zombie  . Lipitor [Atorvastatin] Other (See Comments)    myalgias      Family History  Problem Relation Age of Onset  . Diabetes Father   . Valvular heart disease Father   . Arthritis Father   . Heart disease Father   . Stroke Father   . Alzheimer's disease Mother   . Hyperlipidemia Mother   . Hypertension Mother   . Arthritis Mother   . Lung cancer Mother   . Stroke Mother   . Arthritis/Rheumatoid Sister   . Diabetes Sister   . Hypertension Sister   . Hyperlipidemia Sister   . Depression Sister   . Dementia Maternal Aunt   . Dementia Maternal Uncle   . Heart disease Maternal Uncle   . Stomach cancer Paternal Uncle   . Colon cancer Neg Hx   . Liver disease Neg Hx      Social History Mr. Mcglasson reports that he quit smoking about 9 months ago. His smoking use included cigarettes. He has a 12.75 pack-year smoking history. he has never used smokeless tobacco. Mr. Woolever reports that he does not drink alcohol.   Review of Systems CONSTITUTIONAL: No weight loss,  fever, chills, weakness or fatigue.  HEENT: Eyes: No visual loss, blurred vision, double vision or yellow sclerae.No hearing loss, sneezing, congestion, runny nose or sore throat.  SKIN: No rash or itching.  CARDIOVASCULAR: per hpi RESPIRATORY: per hpi GASTROINTESTINAL: No anorexia, nausea, vomiting or diarrhea. No abdominal pain or blood.  GENITOURINARY: No burning on urination, no polyuria NEUROLOGICAL: No headache, dizziness, syncope, paralysis, ataxia, numbness or tingling in the extremities. No change in bowel or bladder control.  MUSCULOSKELETAL: No muscle, back pain, joint pain or stiffness.  LYMPHATICS: No enlarged nodes. No history of splenectomy.  PSYCHIATRIC: No history of depression or anxiety.  ENDOCRINOLOGIC: No reports of sweating, cold or heat intolerance. No polyuria or polydipsia.  Marland Kitchen   Physical Examination There were no vitals filed for this visit. Filed Weights   05/20/17 0907  Weight: 292 lb (132.5 kg)    Gen: resting comfortably, no acute distress HEENT: no scleral icterus, pupils equal round and reactive, no palptable cervical adenopathy,  CV Resp: Clear to auscultation bilaterally GI: abdomen is soft, non-tender, non-distended, normal bowel sounds, no hepatosplenomegaly MSK: extremities are warm, no edema.  Skin: warm, no rash Neuro:  no focal deficits Psych: appropriate affect   Diagnostic Studies  07/2016 cath 1. Mild nonobstructive LAD stenosis with severe Ekaterina Denise vessel disease involving the first and second diagonals 2. Total occlusion of the right coronary artery within the previously implanted stent from 2010 with TIMI 2 antegrade flow and brisk collateral filling the PDA from the septal perforators of the LAD 3. Severe complex disease in the left circumflex extending into the first obtuse marginal Johm Pfannenstiel, treated successfully with overlapping drug-eluting stents 4. Normal LV function by echo assessment  Recommendations:  Resume anticoagulation  with Eliquis tomorrow for PAF  Plavix 75 mg daily (loaded with 600 mg in cath lab)  No  ASA (has had gingival bleeding and don't think he will tolerate triple anticoagulant therapy)  Consider PCI of the RCA if refractory angina. However, with collateral filling of the PDA and need for chronic anticoagulation, concern over bleeding risk, may be best for medical therapy   Assessment and Plan   1. CAD - no recent symptoms, continue current meds  2. Afib - symptoms controlled, cotinue current meds - has had some blood with stool. Check BMET and CBC, refer to GI. Continue anticoag for now.    3. HTN - elevated in clinic mildly, occasional low home bp's - we will continue current meds  4. Hyperlipidemia - continue to follow in lipid clinic, adverse reaction to repetha       Arnoldo Lenis, M.D

## 2017-05-24 ENCOUNTER — Other Ambulatory Visit: Payer: Self-pay | Admitting: Family

## 2017-05-26 ENCOUNTER — Encounter: Payer: Self-pay | Admitting: Cardiology

## 2017-05-31 ENCOUNTER — Telehealth: Payer: Self-pay | Admitting: *Deleted

## 2017-05-31 NOTE — Telephone Encounter (Signed)
Received a call from patient who is going to purchase some over the counter creams for arthritis in his hands.  He is a Chief Executive Officer.  He wanted to make sure that otc creams would not interfere with the Clear Research study. Per the protocol for the research study otc hand creams are not listed as products that should be avoided.

## 2017-06-02 LAB — CUP PACEART REMOTE DEVICE CHECK
Date Time Interrogation Session: 20190103214013
Implantable Pulse Generator Implant Date: 20180108

## 2017-06-03 ENCOUNTER — Encounter: Payer: Self-pay | Admitting: Internal Medicine

## 2017-06-08 ENCOUNTER — Ambulatory Visit (HOSPITAL_COMMUNITY): Payer: Self-pay | Admitting: Psychiatry

## 2017-06-14 ENCOUNTER — Encounter: Payer: Self-pay | Admitting: Family

## 2017-06-14 ENCOUNTER — Ambulatory Visit (INDEPENDENT_AMBULATORY_CARE_PROVIDER_SITE_OTHER): Payer: Medicare Other | Admitting: Family

## 2017-06-14 VITALS — BP 149/75 | HR 68 | Temp 96.9°F | Ht 75.0 in | Wt 293.0 lb

## 2017-06-14 DIAGNOSIS — J019 Acute sinusitis, unspecified: Secondary | ICD-10-CM

## 2017-06-14 DIAGNOSIS — H60503 Unspecified acute noninfective otitis externa, bilateral: Secondary | ICD-10-CM

## 2017-06-14 DIAGNOSIS — E785 Hyperlipidemia, unspecified: Secondary | ICD-10-CM | POA: Diagnosis not present

## 2017-06-14 MED ORDER — OFLOXACIN 0.3 % OT SOLN: 5.0000 [drp] | Freq: Every day | OTIC | 0 refills | Status: DC

## 2017-06-14 MED ORDER — AMOXICILLIN-POT CLAVULANATE 875-125 MG PO TABS: 1.0000 | ORAL_TABLET | Freq: Two times a day (BID) | ORAL | 0 refills | Status: DC

## 2017-06-14 MED ORDER — DULOXETINE HCL 30 MG PO CPEP: 30.0000 mg | ORAL_CAPSULE | Freq: Every day | ORAL | 1 refills | Status: DC

## 2017-06-14 NOTE — Addendum Note (Signed)
Addended by: Earlene Plater on: 06/14/2017 10:37 AM   Modules accepted: Orders

## 2017-06-14 NOTE — Progress Notes (Signed)
   Subjective:    Patient ID: [name], male    DOB: [birth year], 67 y.o.   MRN: [id number]  Sinusitis  This is a chronic problem. The current episode started more than 1 month ago. The problem has been waxing and waning since onset. There has been no fever. His pain is at a severity of 4/10. Associated symptoms include chills, congestion, coughing, ear pain, headaches, a hoarse voice, sinus pressure, sneezing and a sore throat. Past treatments include antibiotics. The treatment provided mild relief.      Review of Systems  Constitutional: Positive for chills.  HENT: Positive for congestion, ear pain, hoarse voice, sinus pressure, sneezing and sore throat.   Respiratory: Positive for cough.   Neurological: Positive for headaches.  All other systems reviewed and are negative.      Objective:   Physical Exam  Constitutional: He is oriented to person, place, and time. He appears well-developed and well-nourished. No distress.  HENT:  Head: Normocephalic.  Right Ear: There is swelling.  Left Ear: There is drainage, swelling and tenderness.  Nose: Mucosal edema and rhinorrhea present. Right sinus exhibits maxillary sinus tenderness. Left sinus exhibits maxillary sinus tenderness.  Mouth/Throat: Oropharynx is clear and moist.  Eyes: Pupils are equal, round, and reactive to light. Right eye exhibits no discharge. Left eye exhibits no discharge.  Neck: Normal range of motion. Neck supple. No thyromegaly present.  Cardiovascular: Normal rate, regular rhythm, normal heart sounds and intact distal pulses.  No murmur heard. Pulmonary/Chest: Effort normal and breath sounds normal. No respiratory distress. He has no wheezes.  Abdominal: Soft. Bowel sounds are normal. He exhibits no distension. There is no tenderness.  Musculoskeletal: Normal range of motion. He exhibits no edema or tenderness.  Neurological: He is alert and oriented to person, place, and time.  Skin: Skin is warm and dry. No  rash noted. No erythema.  Psychiatric: He has a normal mood and affect. His behavior is normal. Judgment and thought content normal.  Vitals reviewed.     BP (!) 149/75   Pulse 68   Temp (!) 96.9 F (36.1 C)   Ht 6\' 3"  (1.905 m)   Wt 293 lb (132.9 kg)   BMI 36.62 kg/m      Assessment & Plan:  1. Acute otitis externa of both ears, unspecified type Keep clean and dry Do not stick anything into ear - ofloxacin (FLOXIN OTIC) 0.3 % OTIC solution; Place 5 drops into both ears daily.  Dispense: 5 mL; Refill: 0  2. Acute sinusitis, recurrence not specified, unspecified location - Take meds as prescribed - Use a cool mist humidifier  -Use saline nose sprays frequently -Force fluids -For any cough or congestion  Use plain Mucinex- regular strength or max strength is fine -For fever or aces or pains- take tylenol or ibuprofen appropriate for age and weight. -Throat lozenges if help - amoxicillin-clavulanate (AUGMENTIN) 875-125 MG tablet; Take 1 tablet by mouth 2 (two) times daily.  Dispense: 14 tablet; Refill: 0     [name], FNP

## 2017-06-14 NOTE — Patient Instructions (Signed)

## 2017-06-15 LAB — LIPID PANEL
Chol/HDL Ratio: 11.2 ratio — ABNORMAL HIGH (ref 0.0–5.0)
Cholesterol, Total: 235 mg/dL — ABNORMAL HIGH (ref 100–199)
HDL: 21 mg/dL — ABNORMAL LOW (ref >39)
Triglycerides: 689 mg/dL (ref 0–149)

## 2017-06-15 LAB — HEPATIC FUNCTION PANEL
ALT: 46 IU/L — ABNORMAL HIGH (ref 0–44)
AST: 40 IU/L (ref 0–40)
Albumin: 4.5 g/dL (ref 3.6–4.8)
Alkaline Phosphatase: 61 IU/L (ref 39–117)
Bilirubin Total: 0.5 mg/dL (ref 0.0–1.2)
Bilirubin, Direct: 0.2 mg/dL (ref 0.00–0.40)
Total Protein: 7.2 g/dL (ref 6.0–8.5)

## 2017-06-18 ENCOUNTER — Telehealth: Payer: Self-pay | Admitting: Family

## 2017-06-18 MED ORDER — PREDNISONE 10 MG (21) PO TBPK: ORAL_TABLET | ORAL | 0 refills | Status: DC

## 2017-06-18 NOTE — Telephone Encounter (Signed)
Pt was seen Monday with Christy and given abx and patient is no better. C/o chest congestion cough and nasal drainage. Pt he is not running a fever today. Pt has body aches. Pt is still taken Abx and ear drops. Denies any otc meds. Pt uses [facility] in [place] offered appt pt declined.

## 2017-06-18 NOTE — Telephone Encounter (Signed)
Prednisone dose pack sent to pharmacy. Continue Augmentin and start Mucinex.

## 2017-06-18 NOTE — Telephone Encounter (Signed)
Pt aware.

## 2017-06-28 ENCOUNTER — Encounter: Payer: Self-pay | Admitting: *Deleted

## 2017-06-28 DIAGNOSIS — Z006 Encounter for examination for normal comparison and control in clinical research program: Secondary | ICD-10-CM

## 2017-06-28 NOTE — Progress Notes (Signed)
Subject to research clinic for visit T3-M3 in the Clear Research Study.  No c/o today, ae captured, no saes.  Subject 99% compliant with meds and new drug dispensed.  Next appointment scheduled.

## 2017-07-02 ENCOUNTER — Telehealth: Payer: Self-pay | Admitting: Family

## 2017-07-03 NOTE — Telephone Encounter (Signed)
No sample available, patient aware

## 2017-07-07 ENCOUNTER — Other Ambulatory Visit: Payer: Self-pay | Admitting: Family

## 2017-07-07 DIAGNOSIS — E1169 Type 2 diabetes mellitus with other specified complication: Secondary | ICD-10-CM

## 2017-07-07 DIAGNOSIS — E785 Hyperlipidemia, unspecified: Principal | ICD-10-CM

## 2017-07-09 ENCOUNTER — Telehealth: Payer: Self-pay | Admitting: Family

## 2017-07-09 DIAGNOSIS — M4712 Other spondylosis with myelopathy, cervical region: Secondary | ICD-10-CM | POA: Diagnosis not present

## 2017-07-09 NOTE — Telephone Encounter (Signed)
No sample available.

## 2017-07-12 ENCOUNTER — Encounter: Payer: Self-pay | Admitting: Family

## 2017-07-12 ENCOUNTER — Ambulatory Visit (INDEPENDENT_AMBULATORY_CARE_PROVIDER_SITE_OTHER): Payer: Medicare Other | Admitting: Family

## 2017-07-12 VITALS — BP 137/71 | HR 80 | Temp 96.7°F | Ht 75.0 in | Wt 289.0 lb

## 2017-07-12 DIAGNOSIS — J329 Chronic sinusitis, unspecified: Secondary | ICD-10-CM

## 2017-07-12 DIAGNOSIS — H9203 Otalgia, bilateral: Secondary | ICD-10-CM

## 2017-07-12 DIAGNOSIS — E1142 Type 2 diabetes mellitus with diabetic polyneuropathy: Secondary | ICD-10-CM

## 2017-07-12 MED ORDER — INSULIN LISPRO 100 UNIT/ML (KWIKPEN): PEN_INJECTOR | SUBCUTANEOUS | 1 refills | Status: DC

## 2017-07-12 MED ORDER — DOXYCYCLINE HYCLATE 100 MG PO TABS
100.0000 mg | ORAL_TABLET | Freq: Two times a day (BID) | ORAL | 0 refills | Status: DC
Start: 2017-07-12 — End: 2017-09-06

## 2017-07-12 NOTE — Patient Instructions (Signed)

## 2017-07-12 NOTE — Progress Notes (Signed)
Subjective:    Patient ID: [name] [name], male    DOB: 27-May-1950, 67 y.o.   MRN: [id number]  Pt presents to the office for recurrent otalgia. PT was seen on 06/14/17 and started on Augmentin, prednsione dose pack, and Ofoxacin. Pt states the discharge improved.  Otalgia   There is pain in both ears. This is a recurrent problem. The current episode started in the past 7 days. The problem occurs every few minutes. The problem has been waxing and waning. There has been no fever. The pain is at a severity of 8/10. The pain is moderate. Associated symptoms include coughing, headaches and rhinorrhea. Pertinent negatives include no ear discharge or sore throat. Associated symptoms comments: Dizziness . He has tried antibiotics for the symptoms.  Sinusitis  This is a recurrent problem. The current episode started 1 to 4 weeks ago. The problem has been waxing and waning since onset. There has been no fever. His pain is at a severity of 8/10. Associated symptoms include coughing, ear pain, headaches and sinus pressure. Pertinent negatives include no sore throat. Past treatments include oral decongestants.  Diabetes  He presents for his follow-up diabetic visit. He has type 2 diabetes mellitus. Hypoglycemia symptoms include headaches. There are no hypoglycemic complications. There are no diabetic complications. Risk factors for coronary artery disease include dyslipidemia and diabetes mellitus. Current diabetic treatment includes insulin injections.      Review of Systems  HENT: Positive for ear pain, rhinorrhea and sinus pressure. Negative for ear discharge and sore throat.   Respiratory: Positive for cough.   Neurological: Positive for headaches.  All other systems reviewed and are negative.      Objective:   Physical Exam  Constitutional: He is oriented to person, place, and time. He appears well-developed and well-nourished. No distress.  HENT:  Head: Normocephalic.  Right Ear: External ear  normal. A middle ear effusion is present.  Left Ear: External ear normal. A middle ear effusion is present.  Nose: Mucosal edema and rhinorrhea present. Right sinus exhibits maxillary sinus tenderness. Left sinus exhibits maxillary sinus tenderness.  Mouth/Throat: Posterior oropharyngeal erythema present.  Eyes: Pupils are equal, round, and reactive to light. Right eye exhibits no discharge. Left eye exhibits no discharge.  Neck: Normal range of motion. Neck supple. No thyromegaly present.  Cardiovascular: Normal rate, regular rhythm, normal heart sounds and intact distal pulses.  No murmur heard. Pulmonary/Chest: Effort normal and breath sounds normal. No respiratory distress. He has no wheezes.  Abdominal: Soft. Bowel sounds are normal. He exhibits no distension. There is no tenderness.  Musculoskeletal: Normal range of motion. He exhibits no edema or tenderness.  Neurological: He is alert and oriented to person, place, and time.  Skin: Skin is warm and dry. No rash noted. No erythema.  Psychiatric: He has a normal mood and affect. His behavior is normal. Judgment and thought content normal.  Vitals reviewed.   See diabetic foot note  BP 137/71   Pulse 80   Temp (!) 96.7 F (35.9 C) (Oral)   Ht 6\' 3"  (1.905 m)   Wt 289 lb (131.1 kg)   BMI 36.12 kg/m      Assessment & Plan:  1. Otalgia of both ears - Ambulatory referral to ENT - doxycycline (VIBRA-TABS) 100 MG tablet; Take 1 tablet (100 mg total) by mouth 2 (two) times daily.  Dispense: 20 tablet; Refill: 0  2. Diabetic peripheral neuropathy associated with type 2 diabetes mellitus (HCC) Low carb diet Continue  medications RX for diabetic shoes given to patient - insulin lispro (HUMALOG KWIKPEN) 100 UNIT/ML KiwkPen; Use at lunch as needed for elevated BG - 250 to 300 give 5 units and if 301 or above give 10 units  Dispense: 15 mL; Refill: 1  3. Diabetic polyneuropathy associated with type 2 diabetes mellitus (HCC) Low carb  diet Continue medications RX for diabetic shoes given to patient - insulin lispro (HUMALOG KWIKPEN) 100 UNIT/ML KiwkPen; Use at lunch as needed for elevated BG - 250 to 300 give 5 units and if 301 or above give 10 units  Dispense: 15 mL; Refill: 1  4. Recurrent sinusitis - Take meds as prescribed - Use a cool mist humidifier  -Use saline nose sprays frequently -Force fluids -For any cough or congestion  Use plain Mucinex- regular strength or max strength is fine -For fever or aces or pains- take tylenol or ibuprofen. -Throat lozenges if help - Ambulatory referral to ENT - doxycycline (VIBRA-TABS) 100 MG tablet; Take 1 tablet (100 mg total) by mouth 2 (two) times daily.  Dispense: 20 tablet; Refill: 0    [name], FNP

## 2017-07-14 ENCOUNTER — Telehealth: Payer: Self-pay | Admitting: Cardiology

## 2017-07-14 NOTE — Telephone Encounter (Signed)
LMOVM requesting that pt send manual transmission b/c home monitor has not updated in at least 14 days.    

## 2017-07-16 ENCOUNTER — Ambulatory Visit: Payer: Medicare Other | Admitting: Gastroenterology

## 2017-07-18 ENCOUNTER — Other Ambulatory Visit: Payer: Self-pay | Admitting: Family

## 2017-07-19 ENCOUNTER — Other Ambulatory Visit: Payer: Self-pay | Admitting: Family

## 2017-07-19 ENCOUNTER — Other Ambulatory Visit: Payer: Self-pay | Admitting: Student

## 2017-07-26 ENCOUNTER — Other Ambulatory Visit: Payer: Self-pay | Admitting: Family

## 2017-08-09 ENCOUNTER — Ambulatory Visit (INDEPENDENT_AMBULATORY_CARE_PROVIDER_SITE_OTHER): Payer: Medicare Other | Admitting: Otolaryngology

## 2017-08-09 DIAGNOSIS — H608X3 Other otitis externa, bilateral: Secondary | ICD-10-CM | POA: Diagnosis not present

## 2017-08-09 DIAGNOSIS — H903 Sensorineural hearing loss, bilateral: Secondary | ICD-10-CM

## 2017-08-16 ENCOUNTER — Encounter: Payer: Self-pay | Admitting: Internal Medicine

## 2017-08-23 ENCOUNTER — Other Ambulatory Visit: Payer: Self-pay | Admitting: Student

## 2017-08-25 ENCOUNTER — Ambulatory Visit (INDEPENDENT_AMBULATORY_CARE_PROVIDER_SITE_OTHER): Payer: Medicare Other | Admitting: *Deleted

## 2017-08-25 DIAGNOSIS — R002 Palpitations: Secondary | ICD-10-CM

## 2017-08-25 DIAGNOSIS — R55 Syncope and collapse: Secondary | ICD-10-CM

## 2017-08-25 NOTE — Progress Notes (Signed)
Carelink Summary Report / Loop Recorder 

## 2017-08-26 DIAGNOSIS — E1142 Type 2 diabetes mellitus with diabetic polyneuropathy: Secondary | ICD-10-CM | POA: Diagnosis not present

## 2017-08-30 ENCOUNTER — Other Ambulatory Visit: Payer: Self-pay | Admitting: Internal Medicine

## 2017-08-30 ENCOUNTER — Other Ambulatory Visit: Payer: Self-pay | Admitting: Family

## 2017-08-30 DIAGNOSIS — I480 Paroxysmal atrial fibrillation: Secondary | ICD-10-CM

## 2017-08-30 NOTE — Telephone Encounter (Signed)
Eliquis 5mg  refill request received; pt is 67 yrs old, wt-131.1kg, Crea-1.63 on 03/09/17, last seen by Dr. Harl Bowie on 05/20/17; will send in refill to requested pharmacy.

## 2017-09-01 ENCOUNTER — Telehealth: Payer: Self-pay | Admitting: Cardiology

## 2017-09-01 ENCOUNTER — Telehealth: Payer: Self-pay | Admitting: Internal Medicine

## 2017-09-01 NOTE — Telephone Encounter (Signed)
Pt would like to stop taking his Plavix and he's also going to have a back injection done and is needing to hold his Eliquis. Please give him a call

## 2017-09-01 NOTE — Telephone Encounter (Signed)
Spoke w/ pt and requested that he send a manual transmission b/c his home monitor has not updated in at least 14 days.   

## 2017-09-01 NOTE — Telephone Encounter (Signed)
Pt has some questions concerning medications. He would like to discuss stopping his eliquis also. He had an appointment to see Dr. Harl Bowie, but does not want to see him. He would like to see PA instead. Appointment made for Monday 4/22.

## 2017-09-03 NOTE — Progress Notes (Signed)
Cardiology Office Note    Date:  09/06/2017   ID:  [name] [name], DOB 10/27/1950, MRN [id number]  PCP:  [name], FNP  Cardiologist: Carlyle Dolly, MD --> wishes to switch back to Dr. [name] (last evaluated in 2010)   Chief Complaint  Patient presents with  . Follow-up    3 month visit    History of Present Illness:    [name] [name] is a 67 y.o. male with past medical history of CAD (s/p DES to RCA in 2010, cath in 07/2016 showing CTO of RCA with collaterals present and DES x2 to LCx and OM1), PAF (on Eliquis), HTN, HLD, IDDM, and prior CVA who presents to the office today for 39-month follow-up.  He was last examined by Dr. Harl Bowie in 05/2017 and denied any recent chest discomfort. Did experience occasional dyspnea on exertion. He did report some episodes of hematochezia and was referred to GI for further evaluation.  He has since called the office and wish to stop Plavix and Eliquis, therefore follow-up was arranged.  In talking with the patient today, he reports having baseline dyspnea on exertion but equates this to over a 40 pound weight gain within the past year. Denies any associated exertional chest discomfort, orthopnea, PND, lower extremity edema, lightheadedness, dizziness, or presyncope. He does experience occasional palpitations which spontaneously resolve. He has only had to utilize Lasix twice within the past several months. He did experience an episode of sternal chest discomfort two nights ago which occurred after consuming dinner and resolved with Tums. Reports this did not feel similar to his prior anginal symptoms.  He remains on Plavix and Eliquis, and denies missing any recent doses. He denies any evidence of melena, hematochezia, or hematuria.  Does experience easy bruising since being started on Eliquis.   Past Medical History:  Diagnosis Date  . Anxiety   . AODM   . Arthritis   . CAD (coronary artery disease)    a. 2010: DES to CTO of RCA. EF 55% c.  07/2016: cath showing total occlusion within previously placed RCA stent (collaterals present), severe stenosis along LCx and OM1 (treated with 2 overlapping DES).   . Cellulitis and abscess rt groin   . CHEST PAIN-UNSPECIFIED   . Depression   . Diabetes mellitus   . Disorders of iron metabolism   . DISORDERS OF IRON METABOLISM   . GERD (gastroesophageal reflux disease)   . Hyperlipidemia   . Hypertension   . Low serum testosterone level   . Medically noncompliant   . MURMUR   . Myocardial infarction [facility])     Past Surgical History:  Procedure Laterality Date  . BACK SURGERY  2015   ACDF by Dr. [name]  . COLONOSCOPY N/A 10/01/2014   Procedure: COLONOSCOPY;  Surgeon: Daneil Dolin, MD;  [facility];  Service: Endoscopy;  Laterality: N/A;  815  . CORONARY STENT INTERVENTION N/A 07/30/2016   Procedure: Coronary Stent Intervention;  Surgeon: [name], MD;  Location: [place] [facility] LAB;  Service: Cardiovascular;  Laterality: N/A;  . CORONARY STENT PLACEMENT  2000   By Dr. Olevia Perches  . EP IMPLANTABLE DEVICE N/A 05/25/2016   Procedure: Loop Recorder Insertion;  Surgeon: [name], MD;  Location: [place] [facility] LAB;  Service: Cardiovascular;  Laterality: N/A;  . ESOPHAGOGASTRODUODENOSCOPY     esophagus stretched [facility] at [facility]  . ESOPHAGOGASTRODUODENOSCOPY N/A 10/01/2014   Procedure: ESOPHAGOGASTRODUODENOSCOPY (EGD);  Surgeon: Daneil Dolin, MD;  [facility]  [facility];  Service: Endoscopy;  Laterality: N/A;  . HERNIA REPAIR  [id number]   umbilical  . LEFT HEART CATH AND CORONARY ANGIOGRAPHY N/A 07/30/2016   Procedure: Left Heart Cath and Coronary Angiography;  Surgeon: [name], MD;  Location: [place] [facility] LAB;  Service: Cardiovascular;  Laterality: N/A;  . LESION REMOVAL     Lip and hand   . NECK SURGERY      Current Medications: Outpatient Medications Prior to Visit  Medication Sig Dispense Refill  . clopidogrel (PLAVIX) 75 MG tablet TAKE 1 TABLET BY MOUTH  ONCE DAILY WITH BREAKFAST 90 tablet 3  . cyclobenzaprine (FLEXERIL) 10 MG tablet Take 10 mg by mouth.    . DULoxetine (CYMBALTA) 30 MG capsule Take 1 capsule (30 mg total) by mouth daily. 90 capsule 1  . ELIQUIS 5 MG TABS tablet TAKE 1 TABLET BY MOUTH TWICE DAILY 180 tablet 2  . fenofibrate 160 MG tablet TAKE 1 TABLET BY MOUTH ONCE DAILY FOR CHOLESTEROL AND TRIGLYCERIDE 90 tablet 0  . gabapentin (NEURONTIN) 300 MG capsule TAKE 1 CAPSULE BY MOUTH THREE TIMES DAILY 270 capsule 0  . HYDROcodone-acetaminophen (NORCO) 10-325 MG tablet Take 1-2 tablets by mouth every 8 (eight) hours as needed for moderate pain.     Marland Kitchen insulin lispro (HUMALOG KWIKPEN) 100 UNIT/ML KiwkPen Use at lunch as needed for elevated BG - 250 to 300 give 5 units and if 301 or above give 10 units 15 mL 1  . insulin NPH-regular Human (NOVOLIN 70/30) (70-30) 100 UNIT/ML injection Inject 90 Units into the skin 2 (two) times daily with a meal.     . isosorbide mononitrate (IMDUR) 30 MG 24 hr tablet TAKE 1 TABLET BY MOUTH ONCE DAILY 90 tablet 0  . Lactobacillus (PROBIOTIC ACIDOPHILUS PO) Take 1 capsule by mouth daily.    Marland Kitchen linaclotide (LINZESS) 72 MCG capsule Take 1 capsule (72 mcg total) by mouth daily before breakfast. (Patient taking differently: Take 72 mcg by mouth daily as needed. ) 30 capsule 3  . mometasone (NASONEX) 50 MCG/ACT nasal spray Place 2 sprays into the nose as needed.    . pantoprazole (PROTONIX) 40 MG tablet Take 40 mg by mouth daily.     . pantoprazole (PROTONIX) 40 MG tablet TAKE 1 TABLET BY MOUTH ONCE DAILY 90 tablet 1  . telmisartan (MICARDIS) 40 MG tablet TAKE ONE TABLET BY MOUTH ONCE DAILY (REPLACING LISINORIL) 90 tablet 4  . TRULICITY 1.5 KG/4.0NU SOPN INJECT 1.5 MG SUBCUTANEOUSLY  AS DIRECTED EVERY FRIDAY 12 pen 2  . doxycycline (VIBRA-TABS) 100 MG tablet Take 1 tablet (100 mg total) by mouth 2 (two) times daily. 20 tablet 0  . nitroGLYCERIN (NITROSTAT) 0.4 MG SL tablet Place 1 tablet (0.4 mg total) under the  tongue every 5 (five) minutes x 3 doses as needed for chest pain. 25 tablet 2  . furosemide (LASIX) 40 MG tablet Take 1 tablet (40 mg total) by mouth daily as needed. 90 tablet 3  . metoprolol tartrate (LOPRESSOR) 50 MG tablet Take 1 tablet (50 mg total) by mouth 2 (two) times daily. 180 tablet 3   No facility-administered medications prior to visit.      Allergies:   Shellfish allergy; Sulfa antibiotics; Ace inhibitors; Invokana [canagliflozin]; Metformin and related; Pravastatin sodium; Fenofibrate; Iodine; Lisinopril; Livalo [pitavastatin]; Repatha [evolocumab]; Crestor [rosuvastatin]; Horse-derived products; Lexapro [escitalopram oxalate]; Lipitor [atorvastatin]; and Tape   Social History   Socioeconomic History  . Marital status: Married    Spouse name:  Not on file  . Number of children: 2  . Years of education: Not on file  . Highest education level: Not on file  Occupational History  . Occupation: retired  Scientific laboratory technician  . Financial resource strain: Not on file  . Food insecurity:    Worry: Not on file    Inability: Not on file  . Transportation needs:    Medical: Not on file    Non-medical: Not on file  Tobacco Use  . Smoking status: Former Smoker    Packs/day: 0.25    Years: 51.00    Pack years: 12.75    Types: Cigarettes    Last attempt to quit: 08/14/2016    Years since quitting: 1.0  . Smokeless tobacco: Never Used  . Tobacco comment: smokes  a pack a week  Substance and Sexual Activity  . Alcohol use: No    Alcohol/week: 0.0 oz  . Drug use: No  . Sexual activity: Yes  Lifestyle  . Physical activity:    Days per week: Not on file    Minutes per session: Not on file  . Stress: Not on file  Relationships  . Social connections:    Talks on phone: Not on file    Gets together: Not on file    Attends religious service: Not on file    Active member of club or organization: Not on file    Attends meetings of clubs or organizations: Not on file    Relationship  status: Not on file  Other Topics Concern  . Not on file  Social History Narrative   Lives with his wife.  Retired.  Unable to afford medicines.       Family History:  The patient's family history includes Alzheimer's disease in his mother; Arthritis in his father and mother; Arthritis/Rheumatoid in his sister; Dementia in his maternal aunt and maternal uncle; Depression in his sister; Diabetes in his father and sister; Heart disease in his father and maternal uncle; Hyperlipidemia in his mother and sister; Hypertension in his mother and sister; Lung cancer in his mother; Stomach cancer in his paternal uncle; Stroke in his father and mother; Valvular heart disease in his father.   Review of Systems:   Please see the history of present illness.     General:  No chills, fever, night sweats or weight changes.  Cardiovascular:  No chest pain, edema, orthopnea, palpitations, paroxysmal nocturnal dyspnea. Positive for dyspnea on exertion.  Dermatological: No rash, lesions/masses Respiratory: No cough, dyspnea Urologic: No hematuria, dysuria Abdominal:   No nausea, vomiting, diarrhea, bright red blood per rectum, melena, or hematemesis Neurologic:  No visual changes, wkns, changes in mental status. All other systems reviewed and are otherwise negative except as noted above.   Physical Exam:    VS:  BP (!) 149/84   Pulse 90   Ht 6\' 2"  (1.88 m)   Wt 292 lb (132.5 kg)   SpO2 95%   BMI 37.49 kg/m    General: Well developed, overweight Caucasian male appearing in no acute distress. Head: Normocephalic, atraumatic, sclera non-icteric, no xanthomas, nares are without discharge.  Neck: No carotid bruits. JVD not elevated.  Lungs: Respirations regular and unlabored, without wheezes or rales.  Heart: Regular rate and rhythm with occasional ectopic beats. No S3 or S4.  No murmur, no rubs, or gallops appreciated. Abdomen: Soft, non-tender, non-distended with normoactive bowel sounds. No  hepatomegaly. No rebound/guarding. No obvious abdominal masses. Msk:  Strength and tone appear normal  for age. No joint deformities or effusions. Extremities: No clubbing or cyanosis. No lower extremity edema.  Distal pedal pulses are 2+ bilaterally. Neuro: Alert and oriented X 3. Moves all extremities spontaneously. No focal deficits noted. Psych:  Responds to questions appropriately with a normal affect. Skin: No rashes or lesions noted  Wt Readings from Last 3 Encounters:  09/06/17 292 lb (132.5 kg)  07/12/17 289 lb (131.1 kg)  06/28/17 290 lb 3.2 oz (131.6 kg)     Studies/Labs Reviewed:   EKG:  EKG is ordered today. The ekg ordered today demonstrates normal sinus rhythm, heart rate 93, with PVC's and nonspecific T-wave abnormalities along lateral leads (similar to prior tracings).   Recent Labs: 03/09/2017: BUN 35; Creatinine, Ser 1.63; Potassium 5.8; Sodium 138 06/14/2017: ALT 46   Lipid Panel    Component Value Date/Time   CHOL 235 (H) 06/14/2017 1103   TRIG 689 (HH) 06/14/2017 1103   HDL 21 (L) 06/14/2017 1103   CHOLHDL 11.2 (H) 06/14/2017 1103   CHOLHDL 8.5 07/31/2016 0258   VLDL 51 (H) 07/31/2016 0258   LDLCALC Comment 06/14/2017 1103   LDLDIRECT 172 (H) 11/08/2014 0908    Additional studies/ records that were reviewed today include:   Cardiac Catheterization: 07/30/2016 1. Mild nonobstructive LAD stenosis with severe branch vessel disease involving the first and second diagonals 2. Total occlusion of the right coronary artery within the previously implanted stent from 2010 with TIMI 2 antegrade flow and brisk collateral filling the PDA from the septal perforators of the LAD 3. Severe complex disease in the left circumflex extending into the first obtuse marginal branch, treated successfully with overlapping drug-eluting stents 4. Normal LV function by echo assessment  Recommendations:  Resume anticoagulation with Eliquis tomorrow for PAF  Plavix 75 mg daily  (loaded with 600 mg in cath lab)  No ASA (has had gingival bleeding and don't think he will tolerate triple anticoagulant therapy)  Consider PCI of the RCA if refractory angina. However, with collateral filling of the PDA and need for chronic anticoagulation, concern over bleeding risk, may be best for medical therapy  Assessment:    1. Coronary artery disease involving native coronary artery of native heart without angina pectoris   2. Paroxysmal atrial fibrillation (HCC)   3. Essential hypertension, benign   4. Hyperlipidemia LDL goal <70      Plan:   In order of problems listed above:  1. CAD - s/p DES to RCA in 2010 with catheterization in 07/2016 showing CTO of RCA with collaterals present and DES x2 to LCx and OM1. - he denies any recent exertional chest pain. Has experienced worsening dyspnea in the setting of a 40 lb weight gain. Reports activity is limited due to arthritis. We reviewed the role of low-impact exercises and dietary changes in accomplishing weight loss goals.  - continue Plavix, BB, and Imdur. Intolerant to statins in the past. Currently enrolled in the Clear Trial.   2. Paroxysmal Atrial Fibrillation - he notes occasional palpitations but denies any associated lightheadedness, dizziness, or presyncope. No persistent symptoms. Continue on Lopressor 50mg  BID for rate-control. - he initially wanted to stop Eliquis but was in agreement with continuing after discussing the indications for anticoagulation in the setting of atrial fibrillation and that Plavix does not provide the same protection. Continue Eliquis 5mg  BID.   3. HTN - BP is elevated at 149/84 during today's visit. Reports this is usually well-controlled. Will continue to follow.  - continue Imdur 30mg  daily,  Lopressor 50mg  BID, and Telmisartan 40mg  daily.   4. HLD - Intolerant to multiple statins and Repatha. Currently enrolled in the Clear Research Study.    Medication Adjustments/Labs and Tests  Ordered: Current medicines are reviewed at length with the patient today.  Concerns regarding medicines are outlined above.  Medication changes, Labs and Tests ordered today are listed in the Patient Instructions below. Patient Instructions  Medication Instructions:  Your physician recommends that you continue on your current medications as directed. Please refer to the Current Medication list given to you today.   Labwork: NONE   Testing/Procedures: NONE   Follow-Up: Your physician recommends that you schedule a follow-up appointment in: 4 Months.  Any Other Special Instructions Will Be Listed Below (If Applicable).  If you need a refill on your cardiac medications before your next appointment, please call your pharmacy. Thank you for choosing [facility]!     Signed, Erma Heritage, PA-C  09/06/2017 8:09 PM    [facility] [address] [place], [place] [address] Phone: [phone]

## 2017-09-06 ENCOUNTER — Encounter: Payer: Self-pay | Admitting: Student

## 2017-09-06 ENCOUNTER — Ambulatory Visit: Payer: Medicare Other | Admitting: Student

## 2017-09-06 VITALS — BP 149/84 | HR 90 | Ht 74.0 in | Wt 292.0 lb

## 2017-09-06 DIAGNOSIS — E785 Hyperlipidemia, unspecified: Secondary | ICD-10-CM | POA: Diagnosis not present

## 2017-09-06 DIAGNOSIS — I2510 Atherosclerotic heart disease of native coronary artery without angina pectoris: Secondary | ICD-10-CM

## 2017-09-06 DIAGNOSIS — I480 Paroxysmal atrial fibrillation: Secondary | ICD-10-CM | POA: Diagnosis not present

## 2017-09-06 DIAGNOSIS — I10 Essential (primary) hypertension: Secondary | ICD-10-CM | POA: Diagnosis not present

## 2017-09-06 MED ORDER — NITROGLYCERIN 0.4 MG SL SUBL: 0.4000 mg | SUBLINGUAL_TABLET | SUBLINGUAL | 3 refills | Status: DC | PRN

## 2017-09-06 NOTE — Patient Instructions (Signed)
Medication Instructions:  Your physician recommends that you continue on your current medications as directed. Please refer to the Current Medication list given to you today.   Labwork: NONE   Testing/Procedures: NONE   Follow-Up: Your physician recommends that you schedule a follow-up appointment in: 4 Months    Any Other Special Instructions Will Be Listed Below (If Applicable).     If you need a refill on your cardiac medications before your next appointment, please call your pharmacy.  Thank you for choosing [facility]!   

## 2017-09-09 ENCOUNTER — Ambulatory Visit (INDEPENDENT_AMBULATORY_CARE_PROVIDER_SITE_OTHER): Payer: Medicare Other | Admitting: Family

## 2017-09-09 ENCOUNTER — Encounter: Payer: Self-pay | Admitting: Family

## 2017-09-09 VITALS — BP 158/80 | HR 70 | Temp 96.3°F | Ht 74.0 in | Wt 291.6 lb

## 2017-09-09 DIAGNOSIS — L03031 Cellulitis of right toe: Secondary | ICD-10-CM | POA: Diagnosis not present

## 2017-09-09 DIAGNOSIS — E1142 Type 2 diabetes mellitus with diabetic polyneuropathy: Secondary | ICD-10-CM | POA: Diagnosis not present

## 2017-09-09 DIAGNOSIS — I10 Essential (primary) hypertension: Secondary | ICD-10-CM

## 2017-09-09 DIAGNOSIS — F331 Major depressive disorder, recurrent, moderate: Secondary | ICD-10-CM | POA: Diagnosis not present

## 2017-09-09 DIAGNOSIS — N179 Acute kidney failure, unspecified: Secondary | ICD-10-CM | POA: Diagnosis not present

## 2017-09-09 DIAGNOSIS — E1169 Type 2 diabetes mellitus with other specified complication: Secondary | ICD-10-CM | POA: Diagnosis not present

## 2017-09-09 DIAGNOSIS — I2510 Atherosclerotic heart disease of native coronary artery without angina pectoris: Secondary | ICD-10-CM

## 2017-09-09 DIAGNOSIS — L600 Ingrowing nail: Secondary | ICD-10-CM | POA: Diagnosis not present

## 2017-09-09 DIAGNOSIS — E785 Hyperlipidemia, unspecified: Secondary | ICD-10-CM | POA: Diagnosis not present

## 2017-09-09 DIAGNOSIS — Z794 Long term (current) use of insulin: Secondary | ICD-10-CM | POA: Diagnosis not present

## 2017-09-09 DIAGNOSIS — Z9861 Coronary angioplasty status: Secondary | ICD-10-CM | POA: Diagnosis not present

## 2017-09-09 DIAGNOSIS — I252 Old myocardial infarction: Secondary | ICD-10-CM | POA: Diagnosis not present

## 2017-09-09 LAB — BAYER DCA HB A1C WAIVED: HB A1C (BAYER DCA - WAIVED): 8.4 % — ABNORMAL HIGH (ref <7.0)

## 2017-09-09 MED ORDER — CEPHALEXIN 500 MG PO CAPS: 500.0000 mg | ORAL_CAPSULE | Freq: Two times a day (BID) | ORAL | 0 refills | Status: DC

## 2017-09-09 NOTE — Patient Instructions (Addendum)
Paronychia Paronychia is an infection of the skin that surrounds a nail. It usually affects the skin around a fingernail, but it may also occur near a toenail. It often causes pain and swelling around the nail. This condition may come on suddenly or develop over a longer period. In some cases, a collection of pus (abscess) can form near or under the nail. Usually, paronychia is not serious and it clears up with treatment. What are the causes? This condition may be caused by bacteria or fungi. It is commonly caused by either Streptococcus or Staphylococcus bacteria. The bacteria or fungi often cause the infection by getting into the affected area through an opening in the skin, such as a cut or a hangnail. What increases the risk? This condition is more likely to develop in:  People who get their hands wet often, such as those who work as Designer, industrial/product, bartenders, or nurses.  People who bite their fingernails or suck their thumbs.  People who trim their nails too short.  People who have hangnails or injured fingertips.  People who get manicures.  People who have diabetes.  What are the signs or symptoms? Symptoms of this condition include:  Redness and swelling of the skin near the nail.  Tenderness around the nail when you touch the area.  Pus-filled bumps under the cuticle. The cuticle is the skin at the base or sides of the nail.  Fluid or pus under the nail.  Throbbing pain in the area.  How is this diagnosed? This condition is usually diagnosed with a physical exam. In some cases, a sample of pus may be taken from an abscess to be tested in a lab. This can help to determine what type of bacteria or fungi is causing the condition. How is this treated? Treatment for this condition depends on the cause and severity of the condition. If the condition is mild, it may clear up on its own in a few days. Your health care provider may recommend soaking the affected area in warm water a  few times a day. When treatment is needed, the options may include:  Antibiotic medicine, if the condition is caused by a bacterial infection.  Antifungal medicine, if the condition is caused by a fungal infection.  Incision and drainage, if an abscess is present. In this procedure, the health care provider will cut open the abscess so the pus can drain out.  Follow these instructions at home:  Soak the affected area in warm water if directed to do so by your health care provider. You may be told to do this for 20 minutes, 2-3 times a day. Keep the area dry in between soakings.  Take medicines only as directed by your health care provider.  If you were prescribed an antibiotic medicine, finish all of it even if you start to feel better.  Keep the affected area clean.  Do not try to drain a fluid-filled bump yourself.  If you will be washing dishes or performing other tasks that require your hands to get wet, wear rubber gloves. You should also wear gloves if your hands might come in contact with irritating substances, such as cleaners or chemicals.  Follow your health care provider's instructions about: ? Wound care. ? Bandage (dressing) changes and removal. Contact a health care provider if:  Your symptoms get worse or do not improve with treatment.  You have a fever or chills.  You have redness spreading from the affected area.  You have continued  or increased fluid, blood, or pus coming from the affected area.  Your finger or knuckle becomes swollen or is difficult to move. This information is not intended to replace advice given to you by your health care provider. Make sure you discuss any questions you have with your health care provider. Document Released: 10/28/2000 Document Revised: 10/10/2015 Document Reviewed: 04/11/2014 Elsevier Interactive Patient Education  2018 [employer]. Diabetes and Foot Care Diabetes may cause you to have problems because of poor blood  supply (circulation) to your feet and legs. This may cause the skin on your feet to become thinner, break easier, and heal more slowly. Your skin may become dry, and the skin may peel and crack. You may also have nerve damage in your legs and feet causing decreased feeling in them. You may not notice minor injuries to your feet that could lead to infections or more serious problems. Taking care of your feet is one of the most important things you can do for yourself. Follow these instructions at home:  Wear shoes at all times, even in the house. Do not go barefoot. Bare feet are easily injured.  Check your feet daily for blisters, cuts, and redness. If you cannot see the bottom of your feet, use a mirror or ask someone for help.  Wash your feet with warm water (do not use hot water) and mild soap. Then pat your feet and the areas between your toes until they are completely dry. Do not soak your feet as this can dry your skin.  Apply a moisturizing lotion or petroleum jelly (that does not contain alcohol and is unscented) to the skin on your feet and to dry, brittle toenails. Do not apply lotion between your toes.  Trim your toenails straight across. Do not dig under them or around the cuticle. File the edges of your nails with an emery board or nail file.  Do not cut corns or calluses or try to remove them with medicine.  Wear clean socks or stockings every day. Make sure they are not too tight. Do not wear knee-high stockings since they may decrease blood flow to your legs.  Wear shoes that fit properly and have enough cushioning. To break in new shoes, wear them for just a few hours a day. This prevents you from injuring your feet. Always look in your shoes before you put them on to be sure there are no objects inside.  Do not cross your legs. This may decrease the blood flow to your feet.  If you find a minor scrape, cut, or break in the skin on your feet, keep it and the skin around it clean  and dry. These areas may be cleansed with mild soap and water. Do not cleanse the area with peroxide, alcohol, or iodine.  When you remove an adhesive bandage, be sure not to damage the skin around it.  If you have a wound, look at it several times a day to make sure it is healing.  Do not use heating pads or hot water bottles. They may burn your skin. If you have lost feeling in your feet or legs, you may not know it is happening until it is too late.  Make sure your health care provider performs a complete foot exam at least annually or more often if you have foot problems. Report any cuts, sores, or bruises to your health care provider immediately. Contact a health care provider if:  You have an injury that is  not healing.  You have cuts or breaks in the skin.  You have an ingrown nail.  You notice redness on your legs or feet.  You feel burning or tingling in your legs or feet.  You have pain or cramps in your legs and feet.  Your legs or feet are numb.  Your feet always feel cold. Get help right away if:  There is increasing redness, swelling, or pain in or around a wound.  There is a red line that goes up your leg.  Pus is coming from a wound.  You develop a fever or as directed by your health care provider.  You notice a bad smell coming from an ulcer or wound. This information is not intended to replace advice given to you by your health care provider. Make sure you discuss any questions you have with your health care provider. Document Released: 05/01/2000 Document Revised: 10/10/2015 Document Reviewed: 10/11/2012 Elsevier Interactive Patient Education  2017 [employer].

## 2017-09-09 NOTE — Progress Notes (Signed)
Subjective:    Patient ID: [name] [name], male    DOB: November 14, 1950, 67 y.o.   MRN: [id number]  PT presents to the office today for chronic follow up. PT had NSTEMI on 07/30/16 and stent placed. Pt is followed by Cardiologists every 4 months. Pt currently in a clinical trial for hyperlipidemia.   Pt is followed by Neurologists annually.  Diabetes  He presents for his follow-up diabetic visit. He has type 2 diabetes mellitus. His disease course has been stable. Hypoglycemia symptoms include nervousness/anxiousness. Associated symptoms include blurred vision and foot paresthesias. Pertinent negatives for diabetes include no foot ulcerations. There are no hypoglycemic complications. Pertinent negatives for hypoglycemia complications include no blackouts. Symptoms are stable. Diabetic complications include heart disease. Pertinent negatives for diabetic complications include no CVA or nephropathy. Risk factors for coronary artery disease include dyslipidemia, family history, male sex, obesity, stress and sedentary lifestyle. His weight is stable. He is following a generally healthy diet. His overall blood glucose range is 180-200 mg/dl. He does not see a podiatrist.Eye exam is current.  Hyperlipidemia  This is a chronic problem. The current episode started more than 1 year ago. The problem is uncontrolled. Recent lipid tests were reviewed and are high. Exacerbating diseases include obesity. Associated symptoms include shortness of breath. Current antihyperlipidemic treatment includes statins. The current treatment provides moderate improvement of lipids. Risk factors for coronary artery disease include dyslipidemia, male sex and a sedentary lifestyle.  Hypertension  This is a chronic problem. The current episode started more than 1 year ago. The problem has been waxing and waning since onset. The problem is uncontrolled. Associated symptoms include anxiety, blurred vision, malaise/fatigue, peripheral edema  ("at times") and shortness of breath. Risk factors for coronary artery disease include dyslipidemia, diabetes mellitus, obesity and male gender. The current treatment provides moderate improvement. Hypertensive end-organ damage includes CAD/MI. There is no history of CVA.  Depression         This is a chronic problem.  The current episode started more than 1 year ago.   The onset quality is gradual.   The problem occurs intermittently.  The problem has been waxing and waning since onset.  Associated symptoms include irritable, decreased interest and sad.  Associated symptoms include no helplessness and no hopelessness.  Past treatments include SNRIs - Serotonin and norepinephrine reuptake inhibitors.  Compliance with treatment is good.  Past medical history includes anxiety.   Anxiety  Presents for follow-up visit. Symptoms include depressed mood, excessive worry, irritability, nervous/anxious behavior and shortness of breath. Symptoms occur occasionally. The severity of symptoms is moderate. The quality of sleep is good.    Diabetic Neuropathy Reports 8 out 10 in bilateral feet with numbness and burning that is worse at night.     Review of Systems  Constitutional: Positive for irritability and malaise/fatigue.  Eyes: Positive for blurred vision.  Respiratory: Positive for shortness of breath.   Psychiatric/Behavioral: Positive for depression. The patient is nervous/anxious.   All other systems reviewed and are negative.      Objective:   Physical Exam  Constitutional: He is oriented to person, place, and time. He appears well-developed and well-nourished. He is irritable. No distress.  HENT:  Head: Normocephalic.  Right Ear: External ear normal.  Left Ear: External ear normal.  Nose: Nose normal.  Mouth/Throat: Oropharynx is clear and moist.  Eyes: Pupils are equal, round, and reactive to light. Right eye exhibits no discharge. Left eye exhibits no discharge.  Neck: Normal range  of  motion. Neck supple. No thyromegaly present.  Cardiovascular: Normal rate, regular rhythm, normal heart sounds and intact distal pulses.  No murmur heard. Pulmonary/Chest: Effort normal and breath sounds normal. No respiratory distress. He has no wheezes.  Abdominal: Soft. Bowel sounds are normal. He exhibits no distension. There is no tenderness.  Musculoskeletal: Normal range of motion. He exhibits no edema or tenderness.  Right great toenail redness and discharge  Neurological: He is alert and oriented to person, place, and time.  Skin: Skin is warm and dry. No rash noted. No erythema.  Psychiatric: He has a normal mood and affect. His behavior is normal. Judgment and thought content normal.  Vitals reviewed.     BP (!) 158/80   Pulse 70   Temp (!) 96.3 F (35.7 C) (Oral)   Ht _0  (1.88 m)   Wt 291 lb 9.6 oz (132.3 kg)   BMI 37.44 kg/m      Assessment & Plan:  1. Essential hypertension, benign - CMP14+EGFR  2. CAD S/P percutaneous coronary angioplasty - CMP14+EGFR  3. Hyperlipidemia associated with type 2 diabetes mellitus (HCC) - CMP14+EGFR - Lipid panel  4. Diabetic polyneuropathy associated with type 2 diabetes mellitus (HCC) - Bayer DCA Hb A1c Waived - CMP14+EGFR  5. Type 2 diabetes mellitus with diabetic polyneuropathy, with long-term current use of insulin (HCC) - Bayer DCA Hb A1c Waived - CMP14+EGFR  6. Diabetic peripheral neuropathy associated with type 2 diabetes mellitus (HCC) - Bayer DCA Hb A1c Waived - CMP14+EGFR  7. Morbid obesity (HCC) - CMP14+EGFR  8. Moderate episode of recurrent major depressive disorder (HCC) - CMP14+EGFR  9. History of non-ST elevation myocardial infarction (NSTEMI) - CMP14+EGFR  10. Acute renal failure, unspecified acute renal failure type ([place])  11. Paronychia, toe, right Follow up with podiarty - Ambulatory referral to Podiatry - cephALEXin (KEFLEX) 500 MG capsule; Take 1 capsule (500 mg total) by mouth 2 (two)  times daily.  Dispense: 14 capsule; Refill: 0  12. Ingrown toenail - Ambulatory referral to Podiatry - cephALEXin (KEFLEX) 500 MG capsule; Take 1 capsule (500 mg total) by mouth 2 (two) times daily.  Dispense: 14 capsule; Refill: 0   Continue all meds Labs pending Health Maintenance reviewed Diet and exercise encouraged RTO 3 month   [name], FNP

## 2017-09-10 ENCOUNTER — Telehealth: Payer: Self-pay | Admitting: Family

## 2017-09-10 LAB — CMP14+EGFR
ALT: 43 IU/L (ref 0–44)
AST: 50 IU/L — ABNORMAL HIGH (ref 0–40)
Albumin/Globulin Ratio: 1.5 (ref 1.2–2.2)
Albumin: 4.2 g/dL (ref 3.6–4.8)
Alkaline Phosphatase: 80 IU/L (ref 39–117)
BUN/Creatinine Ratio: 17 (ref 10–24)
BUN: 22 mg/dL (ref 8–27)
Bilirubin Total: 0.5 mg/dL (ref 0.0–1.2)
CO2: 23 mmol/L (ref 20–29)
Calcium: 10.6 mg/dL — ABNORMAL HIGH (ref 8.6–10.2)
Chloride: 98 mmol/L (ref 96–106)
Creatinine, Ser: 1.28 mg/dL — ABNORMAL HIGH (ref 0.76–1.27)
GFR calc Af Amer: 67 mL/min/{1.73_m2} (ref >59)
GFR calc non Af Amer: 58 mL/min/{1.73_m2} — ABNORMAL LOW (ref >59)
Globulin, Total: 2.8 g/dL (ref 1.5–4.5)
Glucose: 250 mg/dL — ABNORMAL HIGH (ref 65–99)
Potassium: 4.8 mmol/L (ref 3.5–5.2)
Sodium: 136 mmol/L (ref 134–144)
Total Protein: 7 g/dL (ref 6.0–8.5)

## 2017-09-10 LAB — LIPID PANEL
Chol/HDL Ratio: 10.9 ratio — ABNORMAL HIGH (ref 0.0–5.0)
Cholesterol, Total: 218 mg/dL — ABNORMAL HIGH (ref 100–199)
HDL: 20 mg/dL — ABNORMAL LOW (ref >39)
Triglycerides: 629 mg/dL (ref 0–149)

## 2017-09-10 NOTE — Telephone Encounter (Signed)
Please advise if new antibiotic will be sent to pharmacy.

## 2017-09-10 NOTE — Telephone Encounter (Signed)
Patient aware of MMMs recommendations

## 2017-09-10 NOTE — Telephone Encounter (Signed)
Imodium AD OTC. Diarrhea probably not coming form keflex

## 2017-09-13 ENCOUNTER — Other Ambulatory Visit: Payer: Self-pay | Admitting: Family

## 2017-09-14 ENCOUNTER — Other Ambulatory Visit: Payer: Self-pay | Admitting: Family

## 2017-09-14 MED ORDER — INSULIN DEGLUDEC 200 UNIT/ML ~~LOC~~ SOPN: 8.0000 [IU] | PEN_INJECTOR | Freq: Every day | SUBCUTANEOUS | 1 refills | Status: DC

## 2017-09-17 ENCOUNTER — Ambulatory Visit: Payer: Self-pay | Admitting: Cardiology

## 2017-09-21 ENCOUNTER — Other Ambulatory Visit: Payer: Self-pay | Admitting: *Deleted

## 2017-09-21 MED ORDER — TELMISARTAN 40 MG PO TABS
ORAL_TABLET | ORAL | 1 refills | Status: DC
Start: 2017-09-21 — End: 2018-06-23

## 2017-09-22 ENCOUNTER — Encounter: Payer: Self-pay | Admitting: *Deleted

## 2017-09-27 ENCOUNTER — Encounter: Payer: Medicare Other | Admitting: *Deleted

## 2017-09-27 ENCOUNTER — Ambulatory Visit (INDEPENDENT_AMBULATORY_CARE_PROVIDER_SITE_OTHER): Payer: Medicare Other | Admitting: *Deleted

## 2017-09-27 DIAGNOSIS — R55 Syncope and collapse: Secondary | ICD-10-CM

## 2017-09-27 LAB — CUP PACEART REMOTE DEVICE CHECK
Date Time Interrogation Session: 20190409233957
Implantable Pulse Generator Implant Date: 20180108

## 2017-09-27 NOTE — Progress Notes (Signed)
Carelink Summary Report / Loop Recorder 

## 2017-09-28 ENCOUNTER — Encounter: Payer: Self-pay | Admitting: *Deleted

## 2017-09-28 DIAGNOSIS — Z006 Encounter for examination for normal comparison and control in clinical research program: Secondary | ICD-10-CM

## 2017-09-28 NOTE — Progress Notes (Signed)
Late Entry.   Subject to research clinic on 09/27/17 for T4-M6 in the clear study. No c/o, no aes or saes to report. 99% compliant with meds and new drug dispensed.  Follow up phone call and next clinic visit scheduled.

## 2017-09-30 DIAGNOSIS — L03032 Cellulitis of left toe: Secondary | ICD-10-CM | POA: Diagnosis not present

## 2017-09-30 DIAGNOSIS — M79675 Pain in left toe(s): Secondary | ICD-10-CM | POA: Diagnosis not present

## 2017-10-08 DIAGNOSIS — M47816 Spondylosis without myelopathy or radiculopathy, lumbar region: Secondary | ICD-10-CM | POA: Diagnosis not present

## 2017-10-08 DIAGNOSIS — M48062 Spinal stenosis, lumbar region with neurogenic claudication: Secondary | ICD-10-CM | POA: Diagnosis not present

## 2017-10-12 DIAGNOSIS — L03032 Cellulitis of left toe: Secondary | ICD-10-CM | POA: Diagnosis not present

## 2017-10-18 ENCOUNTER — Other Ambulatory Visit: Payer: Self-pay | Admitting: Family

## 2017-10-19 ENCOUNTER — Telehealth: Payer: Self-pay | Admitting: Family

## 2017-10-19 NOTE — Telephone Encounter (Signed)
Pt called to report feeling extreme fatigue since changing bp med to Isosorbide Pt's bp 75/40 yesterday Please advise

## 2017-10-20 DIAGNOSIS — M5116 Intervertebral disc disorders with radiculopathy, lumbar region: Secondary | ICD-10-CM | POA: Diagnosis not present

## 2017-10-20 NOTE — Telephone Encounter (Signed)
Pt needs to be seen. That BP is too low!

## 2017-10-20 NOTE — Telephone Encounter (Signed)
Patient states he feels fine today. While on the phone I had patient check his bp and it was 178/88. Advised patient ntbs. States he could not today but can tomorrow. Apt made tomorrow.

## 2017-10-21 ENCOUNTER — Encounter: Payer: Self-pay | Admitting: Family

## 2017-10-21 ENCOUNTER — Ambulatory Visit (INDEPENDENT_AMBULATORY_CARE_PROVIDER_SITE_OTHER): Payer: Medicare Other | Admitting: Family

## 2017-10-21 VITALS — BP 126/73 | HR 72 | Temp 96.8°F | Ht 74.0 in | Wt 288.6 lb

## 2017-10-21 DIAGNOSIS — Z9861 Coronary angioplasty status: Secondary | ICD-10-CM | POA: Diagnosis not present

## 2017-10-21 DIAGNOSIS — I2510 Atherosclerotic heart disease of native coronary artery without angina pectoris: Secondary | ICD-10-CM

## 2017-10-21 DIAGNOSIS — I509 Heart failure, unspecified: Secondary | ICD-10-CM | POA: Insufficient documentation

## 2017-10-21 DIAGNOSIS — E1142 Type 2 diabetes mellitus with diabetic polyneuropathy: Secondary | ICD-10-CM

## 2017-10-21 DIAGNOSIS — I5030 Unspecified diastolic (congestive) heart failure: Secondary | ICD-10-CM

## 2017-10-21 DIAGNOSIS — Z794 Long term (current) use of insulin: Secondary | ICD-10-CM

## 2017-10-21 DIAGNOSIS — R42 Dizziness and giddiness: Secondary | ICD-10-CM | POA: Diagnosis not present

## 2017-10-21 DIAGNOSIS — I10 Essential (primary) hypertension: Secondary | ICD-10-CM | POA: Diagnosis not present

## 2017-10-21 MED ORDER — FUROSEMIDE 20 MG PO TABS: 20.0000 mg | ORAL_TABLET | Freq: Every day | ORAL | 1 refills | Status: DC

## 2017-10-21 MED ORDER — INSULIN DEGLUDEC 200 UNIT/ML ~~LOC~~ SOPN: 12.0000 [IU] | PEN_INJECTOR | Freq: Every day | SUBCUTANEOUS | 1 refills | Status: DC

## 2017-10-21 NOTE — Progress Notes (Addendum)
Subjective:    Patient ID: [name] [name], male    DOB: 1951-05-18, 67 y.o.   MRN: [id number]  Chief Complaint  Patient presents with  . Hypertension    recheck   Pt presents to the office today with abnormal blood pressure at home. States he took his BP at home two days ago and it was 78/45 and then yesterday it was 178/88. PT states he has been taking his medication regularly and has not missed or taken extra doses.  Hypertension  This is a chronic problem. The current episode started more than 1 year ago. The problem has been waxing and waning since onset. Pertinent negatives include no blurred vision. The current treatment provides moderate improvement. Hypertensive end-organ damage includes CAD/MI. There is no history of kidney disease.  Diabetes  He presents for his follow-up diabetic visit. He has type 2 diabetes mellitus. Hypoglycemia symptoms include dizziness. Pertinent negatives for hypoglycemia include no confusion. Associated symptoms include fatigue and foot paresthesias. Pertinent negatives for diabetes include no blurred vision. Symptoms are worsening. Diabetic complications include heart disease.  Dizziness  This is a new problem. The current episode started more than 1 month ago. The problem occurs intermittently. Associated symptoms include fatigue.      Review of Systems  Constitutional: Positive for fatigue.  Eyes: Negative for blurred vision.  Neurological: Positive for dizziness.  Psychiatric/Behavioral: Negative for confusion.  All other systems reviewed and are negative.      Objective:   Physical Exam  Constitutional: He is oriented to person, place, and time. He appears well-developed and well-nourished. No distress.  HENT:  Head: Normocephalic.  Eyes: Pupils are equal, round, and reactive to light. Right eye exhibits no discharge. Left eye exhibits no discharge.  Neck: Normal range of motion. Neck supple. No thyromegaly present.  Cardiovascular: Normal  rate, regular rhythm and intact distal pulses.  Murmur heard. Pulmonary/Chest: Effort normal and breath sounds normal. No respiratory distress. He has no wheezes.  Abdominal: Soft. Bowel sounds are normal. He exhibits no distension. There is no tenderness.  Musculoskeletal: Normal range of motion. He exhibits no edema or tenderness.  Neurological: He is alert and oriented to person, place, and time. He has normal reflexes. No cranial nerve deficit.  Skin: Skin is warm and dry. No rash noted. No erythema.  Psychiatric: He has a normal mood and affect. His behavior is normal. Judgment and thought content normal.  Vitals reviewed.     BP 126/73   Pulse 72   Temp (!) 96.8 F (36 C) (Oral)   Ht _0  (1.88 m)   Wt 288 lb 9.6 oz (130.9 kg)   BMI 37.05 kg/m      Assessment & Plan:  [name] [name] comes in today with chief complaint of Hypertension (recheck)   Diagnosis and orders addressed:  1. Essential hypertension, benign - Discussed brining his BP machine and check with our to see if accurate  - BMP8+EGFR  2. Morbid obesity ([place]) - BMP8+EGFR  3. CAD S/P percutaneous coronary angioplasty - furosemide (LASIX) 20 MG tablet; Take 1 tablet (20 mg total) by mouth daily.  Dispense: 90 tablet; Refill: 1 - RSW5+IOEV  4. Diastolic congestive heart failure, unspecified HF chronicity (HCC) Will decrease lasix to 20 mg from 40 mg - furosemide (LASIX) 20 MG tablet; Take 1 tablet (20 mg total) by mouth daily.  Dispense: 90 tablet; Refill: 1 - BMP8+EGFR  5. Type 2 diabetes mellitus with diabetic polyneuropathy, with long-term current  use of insulin (HCC) Will increase Tresiba to 12 units from 8 units Strict low carb diet - Insulin Degludec (TRESIBA FLEXTOUCH) 200 UNIT/ML SOPN; Inject 12 Units into the skin at bedtime.  Dispense: 12 pen; Refill: 1 - BMP8+EGFR  6. Dizziness Fall preventions discussed   Labs pending Health Maintenance reviewed Diet and exercise encouraged  Follow  up plan: Keep chronic follow up   [name], FNP

## 2017-10-21 NOTE — Addendum Note (Signed)
Addended by: [name] A on: 10/21/2017 09:06 AM   Modules accepted: Level of Service

## 2017-10-21 NOTE — Patient Instructions (Signed)

## 2017-10-22 LAB — BMP8+EGFR
BUN/Creatinine Ratio: 21 (ref 10–24)
BUN: 34 mg/dL — ABNORMAL HIGH (ref 8–27)
CO2: 24 mmol/L (ref 20–29)
Calcium: 10.7 mg/dL — ABNORMAL HIGH (ref 8.6–10.2)
Chloride: 92 mmol/L — ABNORMAL LOW (ref 96–106)
Creatinine, Ser: 1.59 mg/dL — ABNORMAL HIGH (ref 0.76–1.27)
GFR calc Af Amer: 51 mL/min/{1.73_m2} — ABNORMAL LOW (ref >59)
GFR calc non Af Amer: 44 mL/min/{1.73_m2} — ABNORMAL LOW (ref >59)
Glucose: 288 mg/dL — ABNORMAL HIGH (ref 65–99)
Potassium: 5.5 mmol/L — ABNORMAL HIGH (ref 3.5–5.2)
Sodium: 133 mmol/L — ABNORMAL LOW (ref 134–144)

## 2017-10-22 LAB — CUP PACEART REMOTE DEVICE CHECK
Date Time Interrogation Session: 20190513001039
Implantable Pulse Generator Implant Date: 20180108

## 2017-10-22 NOTE — Telephone Encounter (Signed)
Patient had a follow up appointment and was seen by provider for blood pressure issues.

## 2017-10-25 ENCOUNTER — Telehealth: Payer: Self-pay | Admitting: *Deleted

## 2017-10-25 ENCOUNTER — Other Ambulatory Visit: Payer: Self-pay | Admitting: Family

## 2017-10-25 DIAGNOSIS — E875 Hyperkalemia: Secondary | ICD-10-CM

## 2017-10-25 MED ORDER — GABAPENTIN 300 MG PO CAPS: 600.0000 mg | ORAL_CAPSULE | Freq: Two times a day (BID) | ORAL | 1 refills | Status: DC

## 2017-10-25 NOTE — Telephone Encounter (Signed)
Prescription sent to pharmacy.

## 2017-10-25 NOTE — Telephone Encounter (Signed)
Pt called stating he is taking Gabapentin 300mg  two tablets BID Please review and advise

## 2017-10-26 ENCOUNTER — Telehealth: Payer: Self-pay | Admitting: Family

## 2017-10-26 NOTE — Telephone Encounter (Signed)
Pt has allergy to shellfish so we can use Vascepa and can not tolerate statins or Repatha. He will need to be on strict low fat diet and exercise.

## 2017-10-26 NOTE — Telephone Encounter (Signed)
Patient aware and states that he is on a trial at [facility] for cholesterol

## 2017-10-29 ENCOUNTER — Ambulatory Visit (INDEPENDENT_AMBULATORY_CARE_PROVIDER_SITE_OTHER): Payer: Medicare Other | Admitting: *Deleted

## 2017-10-29 DIAGNOSIS — I639 Cerebral infarction, unspecified: Secondary | ICD-10-CM

## 2017-11-01 NOTE — Progress Notes (Signed)
Carelink Summary Report / Loop Recorder 

## 2017-11-02 ENCOUNTER — Telehealth: Payer: Self-pay | Admitting: Cardiology

## 2017-11-02 NOTE — Telephone Encounter (Signed)
Spoke w/ pt and requested that he send a manual transmission b/c his home monitor has not updated in at least 14 days.   

## 2017-11-04 ENCOUNTER — Telehealth: Payer: Self-pay | Admitting: Family

## 2017-11-04 ENCOUNTER — Telehealth: Payer: Self-pay

## 2017-11-04 NOTE — Telephone Encounter (Signed)
Insurance prefers Humalin 70/30 over Novolin

## 2017-11-04 NOTE — Telephone Encounter (Signed)
Patient calls and reports that his BP is running low in the afternoon. BP is running in the 80's mostly in the afternoon. He thinks he needs to cut back on his BP meds buts doesn't know what to do. Please advise.

## 2017-11-05 NOTE — Telephone Encounter (Signed)
Patient has a follow up appointment scheduled. 

## 2017-11-05 NOTE — Telephone Encounter (Signed)
Have patient make afternoon appt and bring his BP machine. His last BP was normal and one prior to that on 09/09/17 was elevated.

## 2017-11-08 ENCOUNTER — Telehealth: Payer: Self-pay | Admitting: *Deleted

## 2017-11-08 ENCOUNTER — Ambulatory Visit (INDEPENDENT_AMBULATORY_CARE_PROVIDER_SITE_OTHER): Payer: Medicare Other | Admitting: Family

## 2017-11-08 ENCOUNTER — Encounter: Payer: Self-pay | Admitting: Family

## 2017-11-08 VITALS — BP 125/77 | HR 67 | Temp 97.1°F | Ht 74.0 in | Wt 288.0 lb

## 2017-11-08 DIAGNOSIS — I10 Essential (primary) hypertension: Secondary | ICD-10-CM

## 2017-11-08 LAB — BMP8+EGFR
BUN/Creatinine Ratio: 21 (ref 10–24)
BUN: 29 mg/dL — ABNORMAL HIGH (ref 8–27)
CO2: 26 mmol/L (ref 20–29)
Calcium: 10.1 mg/dL (ref 8.6–10.2)
Chloride: 97 mmol/L (ref 96–106)
Creatinine, Ser: 1.37 mg/dL — ABNORMAL HIGH (ref 0.76–1.27)
GFR calc Af Amer: 61 mL/min/{1.73_m2} (ref >59)
GFR calc non Af Amer: 53 mL/min/{1.73_m2} — ABNORMAL LOW (ref >59)
Glucose: 148 mg/dL — ABNORMAL HIGH (ref 65–99)
Potassium: 5.3 mmol/L — ABNORMAL HIGH (ref 3.5–5.2)
Sodium: 140 mmol/L (ref 134–144)

## 2017-11-08 MED ORDER — INSULIN NPH ISOPHANE & REGULAR (70-30) 100 UNIT/ML ~~LOC~~ SUSP: 90.0000 [IU] | Freq: Two times a day (BID) | SUBCUTANEOUS | 2 refills | Status: DC

## 2017-11-08 NOTE — Progress Notes (Signed)
   Subjective:    Patient ID: [name] [name], male    DOB: 11/29/1950, 67 y.o.   MRN: [id number]  Chief Complaint  Patient presents with  . Blood Pressure Check   PT presents to the office today to recheck HTN. He reports he checks his BP at home in the morning and it ranges 120's/80's, but almost every afternoon 1-3 pm he rechecks his BP and it is 80's/50's.    Pt brought in his own BP machine today and tests to our machine. Accurate to our machine.   Pt states he has held his metoprolol the last 3 days. States he has felt much better.   Hypertension  This is a chronic problem. The current episode started more than 1 year ago. The problem has been waxing and waning since onset. The problem is controlled. Associated symptoms include malaise/fatigue. Pertinent negatives include no headaches, peripheral edema or shortness of breath. Risk factors for coronary artery disease include dyslipidemia, diabetes mellitus, obesity and male gender. The current treatment provides moderate improvement. Hypertensive end-organ damage includes CVA and heart failure.      Review of Systems  Constitutional: Positive for malaise/fatigue.  Respiratory: Negative for shortness of breath.   Neurological: Negative for headaches.  All other systems reviewed and are negative.      Objective:   Physical Exam  Constitutional: He is oriented to person, place, and time. He appears well-developed and well-nourished. No distress.  HENT:  Head: Normocephalic.  Right Ear: External ear normal.  Left Ear: External ear normal.  Mouth/Throat: Oropharynx is clear and moist.  Eyes: Pupils are equal, round, and reactive to light. Right eye exhibits no discharge. Left eye exhibits no discharge.  Neck: Normal range of motion. Neck supple. No thyromegaly present.  Cardiovascular: Normal rate, regular rhythm, normal heart sounds and intact distal pulses.  No murmur heard. Pulmonary/Chest: Effort normal and breath sounds  normal. No respiratory distress. He has no wheezes.  Abdominal: Soft. Bowel sounds are normal. He exhibits no distension. There is no tenderness.  Musculoskeletal: Normal range of motion. He exhibits no edema or tenderness.  Neurological: He is alert and oriented to person, place, and time. He has normal reflexes. No cranial nerve deficit.  Skin: Skin is warm and dry. No rash noted. No erythema.  Psychiatric: He has a normal mood and affect. His behavior is normal. Judgment and thought content normal.  Vitals reviewed.     BP 125/77   Pulse 67   Temp (!) 97.1 F (36.2 C) (Oral)   Ht '6\' 2"'$  (1.88 m)   Wt 288 lb (130.6 kg)   BMI 36.98 kg/m      Assessment & Plan:  [name] [name] comes in today with chief complaint of Blood Pressure Check   Diagnosis and orders addressed:  1. Essential hypertension, benign We will d/c metoprolol today Pt will check BP at home and report any BP less <100/60 If continues to drop will decrease Telmisartan RTO in 3 months   - BMP8+EGFR    [name], FNP

## 2017-11-08 NOTE — Telephone Encounter (Signed)
Please change. I ordered it as Humulin in EPIC

## 2017-11-08 NOTE — Patient Instructions (Signed)

## 2017-11-08 NOTE — Telephone Encounter (Signed)
Fax received [facility] Novolin 70/30 Insurance prefers Humulin 70/30 Is it ok to change Please advise

## 2017-11-09 ENCOUNTER — Other Ambulatory Visit: Payer: Self-pay | Admitting: Family

## 2017-11-09 ENCOUNTER — Other Ambulatory Visit: Payer: Self-pay | Admitting: *Deleted

## 2017-11-09 MED ORDER — INSULIN NPH ISOPHANE & REGULAR (70-30) 100 UNIT/ML ~~LOC~~ SUSP: 90.0000 [IU] | Freq: Two times a day (BID) | SUBCUTANEOUS | 16 refills | Status: DC

## 2017-11-09 MED ORDER — INSULIN NPH ISOPHANE & REGULAR (70-30) 100 UNIT/ML ~~LOC~~ SUSP: 90.0000 [IU] | Freq: Two times a day (BID) | SUBCUTANEOUS | 12 refills | Status: DC

## 2017-11-09 NOTE — Addendum Note (Signed)
Addended by: Wardell Heath on: 11/09/2017 10:04 AM   Modules accepted: Orders

## 2017-11-09 NOTE — Telephone Encounter (Signed)
Medication ordered

## 2017-11-12 ENCOUNTER — Telehealth: Payer: Self-pay | Admitting: Family

## 2017-11-15 ENCOUNTER — Telehealth: Payer: Self-pay | Admitting: Cardiology

## 2017-11-15 NOTE — Telephone Encounter (Signed)
Ok great. Continue to hold metoprolol

## 2017-11-15 NOTE — Telephone Encounter (Signed)
Spoke w/ pt and requested that he send a manual transmission b/c his home monitor has not updated in at least 14 days.   

## 2017-11-21 ENCOUNTER — Other Ambulatory Visit: Payer: Self-pay | Admitting: Family

## 2017-11-21 DIAGNOSIS — E1142 Type 2 diabetes mellitus with diabetic polyneuropathy: Secondary | ICD-10-CM

## 2017-11-24 ENCOUNTER — Telehealth: Payer: Self-pay | Admitting: Family

## 2017-11-24 DIAGNOSIS — E1142 Type 2 diabetes mellitus with diabetic polyneuropathy: Secondary | ICD-10-CM

## 2017-11-24 MED ORDER — INSULIN LISPRO 100 UNIT/ML (KWIKPEN): PEN_INJECTOR | SUBCUTANEOUS | 2 refills | Status: DC

## 2017-11-24 NOTE — Telephone Encounter (Signed)
Pt is on hemalog quick pen and is using it more than it was originally prescribed for. He uses 10-15 units 3 xdaily. He is wanting to know if we can rewrite the rx and send it to [facility]

## 2017-11-24 NOTE — Telephone Encounter (Signed)
Prescription sent to pharmacy.

## 2017-11-26 ENCOUNTER — Other Ambulatory Visit: Payer: Self-pay | Admitting: Family

## 2017-11-26 DIAGNOSIS — E1142 Type 2 diabetes mellitus with diabetic polyneuropathy: Secondary | ICD-10-CM

## 2017-11-26 MED ORDER — INSULIN LISPRO 100 UNIT/ML (KWIKPEN): 5.0000 [IU] | PEN_INJECTOR | Freq: Three times a day (TID) | SUBCUTANEOUS | 2 refills | Status: DC

## 2017-11-26 NOTE — Telephone Encounter (Signed)
Patients wife aware

## 2017-11-26 NOTE — Telephone Encounter (Signed)
Can directions be changed to accommodate increased doses and sent to pharmacy.

## 2017-11-26 NOTE — Telephone Encounter (Signed)
Change Humalog prescriptions the patient can get more of a dose like prescribed by his PCP Caryl Pina, MD [facility] 11/26/2017, 4:22 PM

## 2017-11-30 ENCOUNTER — Other Ambulatory Visit: Payer: Self-pay | Admitting: Family

## 2017-11-30 NOTE — Telephone Encounter (Signed)
Fenofibrate was last prescribed on 08/30/17; patient's last office visit 10/21/17

## 2017-12-01 ENCOUNTER — Ambulatory Visit (INDEPENDENT_AMBULATORY_CARE_PROVIDER_SITE_OTHER): Payer: Medicare Other | Admitting: *Deleted

## 2017-12-01 DIAGNOSIS — I639 Cerebral infarction, unspecified: Secondary | ICD-10-CM

## 2017-12-02 NOTE — Progress Notes (Signed)
Carelink Summary Report / Loop Recorder 

## 2017-12-04 LAB — CUP PACEART REMOTE DEVICE CHECK
Date Time Interrogation Session: 20190615000738
Implantable Pulse Generator Implant Date: 20180108

## 2017-12-10 ENCOUNTER — Ambulatory Visit (INDEPENDENT_AMBULATORY_CARE_PROVIDER_SITE_OTHER): Payer: Medicare Other | Admitting: Family

## 2017-12-10 ENCOUNTER — Encounter: Payer: Self-pay | Admitting: Family

## 2017-12-10 VITALS — BP 107/70 | HR 102 | Temp 97.0°F | Ht 74.0 in | Wt 297.4 lb

## 2017-12-10 DIAGNOSIS — I5030 Unspecified diastolic (congestive) heart failure: Secondary | ICD-10-CM

## 2017-12-10 DIAGNOSIS — I10 Essential (primary) hypertension: Secondary | ICD-10-CM

## 2017-12-10 DIAGNOSIS — K219 Gastro-esophageal reflux disease without esophagitis: Secondary | ICD-10-CM

## 2017-12-10 DIAGNOSIS — I152 Hypertension secondary to endocrine disorders: Secondary | ICD-10-CM

## 2017-12-10 DIAGNOSIS — I2510 Atherosclerotic heart disease of native coronary artery without angina pectoris: Secondary | ICD-10-CM

## 2017-12-10 DIAGNOSIS — F331 Major depressive disorder, recurrent, moderate: Secondary | ICD-10-CM | POA: Diagnosis not present

## 2017-12-10 DIAGNOSIS — E1142 Type 2 diabetes mellitus with diabetic polyneuropathy: Secondary | ICD-10-CM

## 2017-12-10 DIAGNOSIS — Z9861 Coronary angioplasty status: Secondary | ICD-10-CM

## 2017-12-10 DIAGNOSIS — E1159 Type 2 diabetes mellitus with other circulatory complications: Secondary | ICD-10-CM

## 2017-12-10 DIAGNOSIS — E1169 Type 2 diabetes mellitus with other specified complication: Secondary | ICD-10-CM

## 2017-12-10 DIAGNOSIS — Z794 Long term (current) use of insulin: Secondary | ICD-10-CM

## 2017-12-10 DIAGNOSIS — F419 Anxiety disorder, unspecified: Secondary | ICD-10-CM | POA: Diagnosis not present

## 2017-12-10 DIAGNOSIS — E785 Hyperlipidemia, unspecified: Secondary | ICD-10-CM

## 2017-12-10 DIAGNOSIS — L89321 Pressure ulcer of left buttock, stage 1: Secondary | ICD-10-CM

## 2017-12-10 LAB — BAYER DCA HB A1C WAIVED: HB A1C (BAYER DCA - WAIVED): 8.1 % — ABNORMAL HIGH (ref <7.0)

## 2017-12-10 MED ORDER — CLOPIDOGREL BISULFATE 75 MG PO TABS: ORAL_TABLET | ORAL | 3 refills | Status: DC

## 2017-12-10 MED ORDER — DULOXETINE HCL 30 MG PO CPEP: 30.0000 mg | ORAL_CAPSULE | Freq: Every day | ORAL | 1 refills | Status: DC

## 2017-12-10 MED ORDER — PANTOPRAZOLE SODIUM 40 MG PO TBEC: 40.0000 mg | DELAYED_RELEASE_TABLET | Freq: Every day | ORAL | 1 refills | Status: DC

## 2017-12-10 MED ORDER — SEMAGLUTIDE(0.25 OR 0.5MG/DOS) 2 MG/1.5ML ~~LOC~~ SOPN: PEN_INJECTOR | SUBCUTANEOUS | 1 refills | Status: AC

## 2017-12-10 MED ORDER — FENOFIBRATE 160 MG PO TABS: ORAL_TABLET | ORAL | 2 refills | Status: DC

## 2017-12-10 MED ORDER — ISOSORBIDE MONONITRATE ER 30 MG PO TB24: 30.0000 mg | ORAL_TABLET | Freq: Every day | ORAL | 1 refills | Status: DC

## 2017-12-10 NOTE — Patient Instructions (Signed)

## 2017-12-10 NOTE — Progress Notes (Signed)
Subjective:    Patient ID: [name] [name], male    DOB: [birth year], 67 y.o.   MRN: [id number]   Chief Complaint  Patient presents with  . Medical Management of Chronic Issues    three month recheck   PT presents to the office today for chronic follow up. PT had NSTEMI on 07/30/16 and stent placed. Pt is followed by Cardiologistsevery 4 months. Pt currently in a clinical trial for hyperlipidemia.   Pt is followed by Neurologists annually.  Pt complaining nonhealing pressure sore on buttocks that he has had for years.   Hypertension  This is a chronic problem. The current episode started more than 1 year ago. The problem has been resolved since onset. The problem is controlled. Associated symptoms include malaise/fatigue, peripheral edema ("when I'm up on them all day") and shortness of breath. Pertinent negatives include no blurred vision or headaches. Risk factors for coronary artery disease include dyslipidemia, diabetes mellitus, male gender, obesity and sedentary lifestyle. The current treatment provides moderate improvement. Hypertensive end-organ damage includes CAD/MI and heart failure.  Diabetes  He presents for his follow-up diabetic visit. He has type 2 diabetes mellitus. His disease course has been stable. There are no hypoglycemic associated symptoms. Pertinent negatives for hypoglycemia include no headaches. Associated symptoms include fatigue and foot paresthesias. Pertinent negatives for diabetes include no blurred vision. There are no hypoglycemic complications. Symptoms are stable. Diabetic complications include heart disease and peripheral neuropathy. Risk factors for coronary artery disease include diabetes mellitus, dyslipidemia, male sex, hypertension and sedentary lifestyle. He is following a generally unhealthy diet. His overall blood glucose range is 140-180 mg/dl. Eye exam is current.  Hyperlipidemia  This is a chronic problem. The current episode started more than 1  year ago. The problem is controlled. Recent lipid tests were reviewed and are normal. Exacerbating diseases include obesity. Associated symptoms include shortness of breath. Current antihyperlipidemic treatment includes statins. The current treatment provides moderate improvement of lipids. Risk factors for coronary artery disease include dyslipidemia, diabetes mellitus, family history, hypertension, male sex and obesity.  Congestive Heart Failure  Presents for follow-up visit. Associated symptoms include fatigue and shortness of breath. The symptoms have been stable.  Gastroesophageal Reflux  He complains of belching and heartburn. He reports no coughing. This is a chronic problem. The current episode started more than 1 year ago. The problem occurs occasionally. The problem has been waxing and waning. Associated symptoms include fatigue. Risk factors include obesity. He has tried a PPI for the symptoms. The treatment provided moderate relief.  Depression         This is a chronic problem.  The current episode started more than 1 year ago.   The onset quality is gradual.   The problem occurs intermittently.  The problem has been waxing and waning since onset.  Associated symptoms include fatigue, decreased interest and sad.  Associated symptoms include not irritable and no headaches.  Past treatments include SSRIs - Selective serotonin reuptake inhibitors. Diabetic Neuropathy  PT states he has bilateral feet numbness with a burning, aching pain of 10 out 10.   Review of Systems  Constitutional: Positive for fatigue and malaise/fatigue.  Eyes: Negative for blurred vision.  Respiratory: Positive for shortness of breath. Negative for cough.   Gastrointestinal: Positive for heartburn.  Neurological: Negative for headaches.  Psychiatric/Behavioral: Positive for depression.  All other systems reviewed and are negative.      Objective:   Physical Exam  Constitutional: He is oriented to  person,  place, and time. He appears well-developed and well-nourished. He is not irritable. No distress.  HENT:  Head: Normocephalic.  Right Ear: External ear normal.  Left Ear: External ear normal.  Mouth/Throat: Oropharynx is clear and moist.  Eyes: Pupils are equal, round, and reactive to light. Right eye exhibits no discharge. Left eye exhibits no discharge.  Neck: Normal range of motion. Neck supple. No thyromegaly present.  Cardiovascular: Normal rate, regular rhythm, normal heart sounds and intact distal pulses.  No murmur heard. Pulmonary/Chest: Effort normal and breath sounds normal. No respiratory distress. He has no wheezes.  Abdominal: Soft. Bowel sounds are normal. He exhibits no distension. There is no tenderness.  Musculoskeletal: Normal range of motion. He exhibits edema (2+ BLE). He exhibits no tenderness.  Neurological: He is alert and oriented to person, place, and time. He has normal reflexes. No cranial nerve deficit.  Skin: Skin is warm and dry. Abrasion (stage 1 pressure ulcer on left gluteal ) noted. No rash noted. No erythema.     Psychiatric: He has a normal mood and affect. His behavior is normal. Judgment and thought content normal.  Vitals reviewed.     BP 107/70   Pulse (!) 102   Temp (!) 97 F (36.1 C) (Oral)   Ht '6\' 2"'$  (1.88 m)   Wt 297 lb 6.4 oz (134.9 kg)   BMI 38.18 kg/m      Assessment & Plan:  [name] [name] comes in today with chief complaint of Medical Management of Chronic Issues (three month recheck)   Diagnosis and orders addressed:  1. Anxiety - CMP14+EGFR - CBC with Differential/Platelet  2. CAD S/P percutaneous coronary angioplasty - CMP14+EGFR - CBC with Differential/Platelet - clopidogrel (PLAVIX) 75 MG tablet; TAKE 1 TABLET BY MOUTH ONCE DAILY WITH BREAKFAST  Dispense: 90 tablet; Refill: 3 - isosorbide mononitrate (IMDUR) 30 MG 24 hr tablet; Take 1 tablet (30 mg total) by mouth daily.  Dispense: 90 tablet; Refill: 1  3. Diastolic  congestive heart failure, unspecified HF chronicity (HCC) - CMP14+EGFR - CBC with Differential/Platelet  4. Moderate episode of recurrent major depressive disorder (HCC) - CMP14+EGFR - CBC with Differential/Platelet - DULoxetine (CYMBALTA) 30 MG capsule; Take 1 capsule (30 mg total) by mouth daily.  Dispense: 90 capsule; Refill: 1  5. Type 2 diabetes mellitus with diabetic polyneuropathy, with long-term current use of insulin (HCC) We will try Ozempic and stop Trulicity, pt requesting weight loss medication. Discussed we could not give phentermine, but could see if this could help.  Low carb diet - Bayer DCA Hb A1c Waived - CMP14+EGFR - CBC with Differential/Platelet - Microalbumin / creatinine urine ratio - Semaglutide (OZEMPIC) 0.25 or 0.5 MG/DOSE SOPN; Inject 0.25 mg into the skin once a week for 28 days, THEN 0.5 mg once a week for 28 days.  Dispense: 12 pen; Refill: 1  6. Morbid obesity ([place]) - CMP14+EGFR - CBC with Differential/Platelet  7. Hyperlipidemia associated with type 2 diabetes mellitus (HCC) - CMP14+EGFR - CBC with Differential/Platelet - Lipid panel - fenofibrate 160 MG tablet; TAKE 1 TABLET BY MOUTH ONCE DAILY FOR CHOLESTEROL AND  TRIGLYCERIDE  Dispense: 90 tablet; Refill: 2  8. Gastroesophageal reflux disease without esophagitis - CMP14+EGFR - CBC with Differential/Platelet - pantoprazole (PROTONIX) 40 MG tablet; Take 1 tablet (40 mg total) by mouth daily.  Dispense: 90 tablet; Refill: 1  9. Diabetic peripheral neuropathy associated with type 2 diabetes mellitus (HCC) - CMP14+EGFR - CBC with Differential/Platelet  10. Diabetic polyneuropathy  associated with type 2 diabetes mellitus (HCC) - CMP14+EGFR - CBC with Differential/Platelet - DULoxetine (CYMBALTA) 30 MG capsule; Take 1 capsule (30 mg total) by mouth daily.  Dispense: 90 capsule; Refill: 1  11. Hypertension associated with diabetes ([place]) - CMP14+EGFR - CBC with Differential/Platelet  12.  Pressure injury of left buttock, stage 1 Mepilex dressing applied and told to change every 3 days   Labs pending Health Maintenance reviewed Diet and exercise encouraged  Follow up plan: 3 months    [name], FNP

## 2017-12-11 LAB — MICROALBUMIN / CREATININE URINE RATIO
Creatinine, Urine: 133.9 mg/dL
Microalb/Creat Ratio: 29.9 mg/g creat (ref 0.0–30.0)
Microalbumin, Urine: 40.1 ug/mL

## 2017-12-11 LAB — CMP14+EGFR
ALT: 40 IU/L (ref 0–44)
AST: 43 IU/L — ABNORMAL HIGH (ref 0–40)
Albumin/Globulin Ratio: 1.8 (ref 1.2–2.2)
Albumin: 4.3 g/dL (ref 3.6–4.8)
Alkaline Phosphatase: 63 IU/L (ref 39–117)
BUN/Creatinine Ratio: 15 (ref 10–24)
BUN: 21 mg/dL (ref 8–27)
Bilirubin Total: 0.6 mg/dL (ref 0.0–1.2)
CO2: 23 mmol/L (ref 20–29)
Calcium: 9.8 mg/dL (ref 8.6–10.2)
Chloride: 95 mmol/L — ABNORMAL LOW (ref 96–106)
Creatinine, Ser: 1.38 mg/dL — ABNORMAL HIGH (ref 0.76–1.27)
GFR calc Af Amer: 61 mL/min/{1.73_m2} (ref >59)
GFR calc non Af Amer: 53 mL/min/{1.73_m2} — ABNORMAL LOW (ref >59)
Globulin, Total: 2.4 g/dL (ref 1.5–4.5)
Glucose: 276 mg/dL — ABNORMAL HIGH (ref 65–99)
Potassium: 4.7 mmol/L (ref 3.5–5.2)
Sodium: 136 mmol/L (ref 134–144)
Total Protein: 6.7 g/dL (ref 6.0–8.5)

## 2017-12-11 LAB — CBC WITH DIFFERENTIAL/PLATELET
Basophils Absolute: 0.1 10*3/uL (ref 0.0–0.2)
Basos: 1 %
EOS (ABSOLUTE): 0.2 10*3/uL (ref 0.0–0.4)
Eos: 2 %
Hematocrit: 39.3 % (ref 37.5–51.0)
Hemoglobin: 13.3 g/dL (ref 13.0–17.7)
Immature Grans (Abs): 0.1 10*3/uL (ref 0.0–0.1)
Immature Granulocytes: 1 %
Lymphocytes Absolute: 1.8 10*3/uL (ref 0.7–3.1)
Lymphs: 22 %
MCH: 30.3 pg (ref 26.6–33.0)
MCHC: 33.8 g/dL (ref 31.5–35.7)
MCV: 90 fL (ref 79–97)
Monocytes Absolute: 0.6 10*3/uL (ref 0.1–0.9)
Monocytes: 7 %
Neutrophils Absolute: 5.5 10*3/uL (ref 1.4–7.0)
Neutrophils: 67 %
Platelets: 289 10*3/uL (ref 150–450)
RBC: 4.39 x10E6/uL (ref 4.14–5.80)
RDW: 13.5 % (ref 12.3–15.4)
WBC: 8.2 10*3/uL (ref 3.4–10.8)

## 2017-12-11 LAB — LIPID PANEL
Chol/HDL Ratio: 11.2 ratio — ABNORMAL HIGH (ref 0.0–5.0)
Cholesterol, Total: 213 mg/dL — ABNORMAL HIGH (ref 100–199)
HDL: 19 mg/dL — ABNORMAL LOW (ref >39)
Triglycerides: 583 mg/dL (ref 0–149)

## 2017-12-14 ENCOUNTER — Telehealth: Payer: Self-pay | Admitting: Family

## 2017-12-14 ENCOUNTER — Other Ambulatory Visit: Payer: Self-pay | Admitting: Family

## 2017-12-14 DIAGNOSIS — E1142 Type 2 diabetes mellitus with diabetic polyneuropathy: Secondary | ICD-10-CM

## 2017-12-14 DIAGNOSIS — L03032 Cellulitis of left toe: Secondary | ICD-10-CM | POA: Diagnosis not present

## 2017-12-14 DIAGNOSIS — E1151 Type 2 diabetes mellitus with diabetic peripheral angiopathy without gangrene: Secondary | ICD-10-CM | POA: Diagnosis not present

## 2017-12-14 DIAGNOSIS — Z794 Long term (current) use of insulin: Principal | ICD-10-CM

## 2017-12-14 MED ORDER — INSULIN DEGLUDEC 200 UNIT/ML ~~LOC~~ SOPN: 15.0000 [IU] | PEN_INJECTOR | Freq: Every day | SUBCUTANEOUS | 1 refills | Status: DC

## 2017-12-14 NOTE — Telephone Encounter (Signed)
Aware of lab results and medication changes.

## 2017-12-22 ENCOUNTER — Ambulatory Visit (INDEPENDENT_AMBULATORY_CARE_PROVIDER_SITE_OTHER): Payer: Medicare Other

## 2017-12-22 VITALS — BP 122/71 | HR 98 | Temp 98.1°F | Ht 74.0 in | Wt 296.0 lb

## 2017-12-22 DIAGNOSIS — Z0000 Encounter for general adult medical examination without abnormal findings: Secondary | ICD-10-CM

## 2017-12-22 NOTE — Progress Notes (Signed)
Subjective:   [name] [name] is a 67 y.o. male who presents for Medicare Annual/Subsequent preventive examination.  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  He lives at home with his wife, [name] [name].  He has two children, two grandchildren, and one great-grandchild from a previous marriage.  They are very active members of their church and in their spare time enjoy going fishing together.  Review of Systems:   Cardiac Risk Factors include: advanced age (>26men, >71 women);diabetes mellitus;hypertension;male gender;obesity (BMI >30kg/m2);sedentary lifestyle;smoking/ tobacco exposure     Objective:    Vitals: BP 122/71   [name] 98   Temp 98.1 F (36.7 C) (Oral)   Ht 6\' 2"  (1.88 m)   Wt 296 lb (134.3 kg)   BMI 38.00 kg/m   Body mass index is 38 kg/m.  Advanced Directives 12/22/2017 12/15/2016 07/29/2016 05/21/2016 05/21/2016 12/11/2015 10/01/2014  Does Patient Have a Medical Advance Directive? No No No No No No No  Would patient like information on creating a medical advance directive? No - Patient declined No - Patient declined No - Patient declined No - Patient declined No - Patient declined Yes - Educational materials given No - patient declined information    Tobacco Social History   Tobacco Use  Smoking Status Former Smoker  . Packs/day: 0.25  . Years: 51.00  . Pack years: 12.75  . Types: Cigarettes  . Last attempt to quit: 08/14/2016  . Years since quitting: 1.3  Smokeless Tobacco Never Used  Tobacco Comment   smokes  a pack a week      Clinical Intake:  Past Medical History:  Diagnosis Date  . Anxiety   . AODM   . Arthritis   . CAD (coronary artery disease)    a. 2010: DES to CTO of RCA. EF 55% c. 07/2016: cath showing total occlusion within previously placed RCA stent (collaterals present), severe stenosis along LCx and OM1 (treated with 2 overlapping DES).   . Cellulitis and abscess rt groin   . CHEST  PAIN-UNSPECIFIED   . Depression   . Diabetes mellitus   . Disorders of iron metabolism   . DISORDERS OF IRON METABOLISM   . GERD (gastroesophageal reflux disease)   . Hyperlipidemia   . Hypertension   . Low serum testosterone level   . Medically noncompliant   . MURMUR   . Myocardial infarction [facility])    Past Surgical History:  Procedure Laterality Date  . BACK SURGERY  2015   ACDF by Dr. [name]  . COLONOSCOPY N/A 10/01/2014   Procedure: COLONOSCOPY;  Surgeon: Daneil Dolin, MD;  [facility];  Service: Endoscopy;  Laterality: N/A;  815  . CORONARY STENT INTERVENTION N/A 07/30/2016   Procedure: Coronary Stent Intervention;  Surgeon: [name], MD;  Location: [place] [facility];  Service: Cardiovascular;  Laterality: N/A;  . CORONARY STENT PLACEMENT  2000   By Dr. Olevia Perches  . EP IMPLANTABLE DEVICE N/A 05/25/2016   Procedure: Loop Recorder Insertion;  Surgeon: [name], MD;  Location: [place] [facility];  Service: Cardiovascular;  Laterality: N/A;  . ESOPHAGOGASTRODUODENOSCOPY     esophagus [facility] at [facility]  . ESOPHAGOGASTRODUODENOSCOPY N/A 10/01/2014   Procedure: ESOPHAGOGASTRODUODENOSCOPY (EGD);  Surgeon: Daneil Dolin, MD;  Location: [facility];  Service: Endoscopy;  Laterality: N/A;  . HERNIA REPAIR  3614   umbilical  . LEFT HEART CATH AND CORONARY ANGIOGRAPHY N/A 07/30/2016  Procedure: Left Heart Cath and Coronary Angiography;  Surgeon: [name], MD;  Location: [place] [facility];  Service: Cardiovascular;  Laterality: N/A;  . LESION REMOVAL     Lip and hand   . NECK SURGERY     Family History  Problem Relation Age of Onset  . Diabetes Father   . Valvular heart disease Father   . Arthritis Father   . Heart disease Father   . Stroke Father   . Alzheimer's disease Mother   . Hyperlipidemia Mother   . Hypertension Mother   . Arthritis Mother   . Lung cancer Mother   . Stroke Mother   . Arthritis/Rheumatoid Sister   . Diabetes  Sister   . Hypertension Sister   . Hyperlipidemia Sister   . Depression Sister   . Dementia Maternal Aunt   . Dementia Maternal Uncle   . Heart disease Maternal Uncle   . Stomach cancer Paternal Uncle   . Colon cancer Neg Hx   . Liver disease Neg Hx    Social History   Socioeconomic History  . Marital status: Married    Spouse name: Not on file  . Number of children: 2  . Years of education: Not on file  . Highest education level: Not on file  Occupational History  . Occupation: retired  Scientific laboratory technician  . Financial resource strain: Not on file  . Food insecurity:    Worry: Not on file    Inability: Not on file  . Transportation needs:    Medical: Not on file    Non-medical: Not on file  Tobacco Use  . Smoking status: Former Smoker    Packs/day: 0.25    Years: 51.00    Pack years: 12.75    Types: Cigarettes    Last attempt to quit: 08/14/2016    Years since quitting: 1.3  . Smokeless tobacco: Never Used  . Tobacco comment: smokes  a pack a week  Substance and Sexual Activity  . Alcohol use: No    Alcohol/week: 0.0 oz  . Drug use: No  . Sexual activity: Yes  Lifestyle  . Physical activity:    Days per week: Not on file    Minutes per session: Not on file  . Stress: Not on file  Relationships  . Social connections:    Talks on phone: Not on file    Gets together: Not on file    Attends religious service: Not on file    Active member of club or organization: Not on file    Attends meetings of clubs or organizations: Not on file    Relationship status: Not on file  Other Topics Concern  . Not on file  Social History Narrative   Lives with his wife.  Retired.  Unable to afford medicines.      Outpatient Encounter Medications as of 12/22/2017  Medication Sig  . clopidogrel (PLAVIX) 75 MG tablet TAKE 1 TABLET BY MOUTH ONCE DAILY WITH BREAKFAST  . cyclobenzaprine (FLEXERIL) 10 MG tablet Take 10 mg by mouth.  . DULoxetine (CYMBALTA) 30 MG capsule Take 1 capsule (30  mg total) by mouth daily.  Marland Kitchen ELIQUIS 5 MG TABS tablet TAKE 1 TABLET BY MOUTH TWICE DAILY  . fenofibrate 160 MG tablet TAKE 1 TABLET BY MOUTH ONCE DAILY FOR CHOLESTEROL AND  TRIGLYCERIDE  . furosemide (LASIX) 20 MG tablet Take 1 tablet (20 mg total) by mouth daily.  Marland Kitchen gabapentin (NEURONTIN) 300 MG capsule Take 2 capsules (600  mg total) by mouth 2 (two) times daily.  Marland Kitchen HYDROcodone-acetaminophen (NORCO) 10-325 MG tablet Take 1-2 tablets by mouth every 8 (eight) hours as needed for moderate pain.   . Insulin Degludec (TRESIBA FLEXTOUCH) 200 UNIT/ML SOPN Inject 16 Units into the skin at bedtime.  . insulin lispro (HUMALOG KWIKPEN) 100 UNIT/ML KiwkPen Inject 0.05-0.2 mLs (5-20 Units total) into the skin 3 (three) times daily with meals. Give enough for 1 month  . insulin NPH-regular Human (HUMULIN 70/30) (70-30) 100 UNIT/ML injection Inject 90 Units into the skin 2 (two) times daily with a meal.  . isosorbide mononitrate (IMDUR) 30 MG 24 hr tablet Take 1 tablet (30 mg total) by mouth daily.  . Lactobacillus (PROBIOTIC ACIDOPHILUS PO) Take 1 capsule by mouth daily.  Marland Kitchen linaclotide (LINZESS) 72 MCG capsule Take 1 capsule (72 mcg total) by mouth daily before breakfast. (Patient taking differently: Take 72 mcg by mouth daily as needed. )  . mometasone (NASONEX) 50 MCG/ACT nasal spray Place 2 sprays into the nose as needed.  . nitroGLYCERIN (NITROSTAT) 0.4 MG SL tablet Place 1 tablet (0.4 mg total) under the tongue every 5 (five) minutes x 3 doses as needed for chest pain.  . pantoprazole (PROTONIX) 40 MG tablet Take 1 tablet (40 mg total) by mouth daily.  . Semaglutide (OZEMPIC) 0.25 or 0.5 MG/DOSE SOPN Inject 0.25 mg into the skin once a week for 28 days, THEN 0.5 mg once a week for 28 days.  Marland Kitchen telmisartan (MICARDIS) 40 MG tablet TAKE ONE TABLET BY MOUTH ONCE DAILY (REPLACING LISINORIL)   No facility-administered encounter medications on file as of 12/22/2017.     Activities of Daily Living In your present  state of health, do you have any difficulty performing the following activities: 12/22/2017  Hearing? N  Vision? N  Difficulty concentrating or making decisions? N  Walking or climbing stairs? Y  Comment low back pain  Dressing or bathing? N  Doing errands, shopping? N  Preparing Food and eating ? N  Using the Toilet? N  In the past six months, have you accidently leaked urine? N  Do you have problems with loss of bowel control? N  Managing your Medications? N  Managing your Finances? N  Housekeeping or managing your Housekeeping? N  Some recent data might be hidden   Mr. Prettyman has problems with walking long distances or standing long periods of time because of pain in his lower back.  Patient Care Team: [name], FNP as PCP - General (Family Medicine) Harl Bowie, Alphonse Guild, MD as PCP - Cardiology (Cardiology) Rodolph Bong, MD (Inactive) as Attending Physician (Cardiology) Glenna Fellows, MD as Attending Physician (Neurosurgery) [name], MD as Consulting Physician (Vascular Surgery) Carolan Clines, MD as Consulting Physician (Urology) [name] Cristopher Estimable, MD as Consulting Physician (Gastroenterology) Dorene Ar, MD as Consulting Physician (Pain Medicine) Melvenia Beam, MD as Consulting Physician (Neurology)   Assessment:   This is a routine wellness examination for Pj.  Exercise Activities and Dietary recommendations Current Exercise Habits: The patient does not participate in regular exercise at present, Exercise limited by: orthopedic condition(s)  Goals    . DIET - EAT MORE FRUITS AND VEGETABLES    . Eat more fruits and vegetables    . Increase physical activity    . Plan meals       Fall Risk Fall Risk  12/22/2017 12/10/2017 11/08/2017 09/09/2017 07/12/2017  Falls in the past year? No No No No No  Number  falls in past yr: - - - - -  Injury with Fall? - - - - -  Risk Factor Category  - - - - -  Risk for fall due to : - - - - -  Follow up - - - - -    Is the patient's home free of loose throw rugs in walkways, pet beds, electrical cords, etc?   Yes      Grab bars in the bathroom? No      Handrails on the stairs?   Yes     Adequate lighting?   Yes   Depression Screen PHQ 2/9 Scores 12/22/2017 12/10/2017 11/08/2017 09/09/2017  PHQ - 2 Score 0 0 0 0  PHQ- 9 Score - - - -    Cognitive Function MMSE - Mini Mental State Exam 12/22/2017 12/22/2017 12/15/2016 12/11/2015 08/01/2014  Orientation to time 5 5 5 5 5   Orientation to Place 5 5 5 5 5   Registration 3 - - 3 3  Attention/ Calculation 5 - 5 5 5   Recall 3 - - 3 1  Language- name 2 objects 2 - 2 2 1   Language- repeat 1 - - 1 1  Language- follow 3 step command 3 - - 3 3  Language- read & follow direction 1 - - 1 1  Write a sentence 1 - - 1 1  Copy design 1 - - 1 1  Total score 30 - - 30 27     Mr. Sippel did well on his MMSE, scoring 30 out of 30 available points.  Immunization History  Administered Date(s) Administered  . Influenza,inj,Quad PF,6+ Mos 02/26/2016  . Tdap 09/17/2015     Screening Tests Health Maintenance  Topic Date Due  . INFLUENZA VACCINE  08/23/2018 (Originally 12/16/2017)  . PNA vac Low Risk Adult (1 of 2 - PCV13) 09/29/2026 (Originally 10/12/2015)  . OPHTHALMOLOGY EXAM  03/16/2018  . HEMOGLOBIN A1C  06/12/2018  . FOOT EXAM  07/12/2018  . COLONOSCOPY  09/30/2024  . TETANUS/TDAP  09/16/2025  . Hepatitis C Screening  Completed   Cancer Screenings: Lung: Low Dose CT Chest recommended if Age 6-80 years, 30 pack-year currently smoking OR have quit w/in 15years. Patient does qualify. Colorectal: complete on 10/01/2014        Plan:    Follow up with PCP [name].   I have personally reviewed and noted the following in the patient's chart:   . Medical and social history . Use of alcohol, tobacco or illicit drugs  . Current medications and supplements . Functional ability and status . Nutritional status . Physical activity . Advanced  directives . List of other physicians . Hospitalizations, surgeries, and ER visits in previous 12 months . Vitals . Screenings to include cognitive, depression, and falls . Referrals and appointments  In addition, I have reviewed and discussed with patient certain preventive protocols, quality metrics, and best practice recommendations. A written personalized care plan for preventive services as well as general preventive health recommendations were provided to patient.     Burnadette Pop, LPN  08/22/9627

## 2017-12-22 NOTE — Patient Instructions (Signed)
  [name] [name] , Thank you for taking time to come for your Medicare Wellness Visit. I appreciate your ongoing commitment to your health goals. Please review the following plan we discussed and let me know if I can assist you in the future.   These are the goals we discussed: Goals    . DIET - EAT MORE FRUITS AND VEGETABLES    . Eat more fruits and vegetables    . Increase physical activity    . Plan meals       This is a list of the screening recommended for you and due dates:  Health Maintenance  Topic Date Due  . Flu Shot  08/23/2018*  . Pneumonia vaccines (1 of 2 - PCV13) 09/29/2026*  . Eye exam for diabetics  03/16/2018  . Hemoglobin A1C  06/12/2018  . Complete foot exam   07/12/2018  . Colon Cancer Screening  09/30/2024  . Tetanus Vaccine  09/16/2025  .  Hepatitis C: One time screening is recommended by Center for Disease Control  (CDC) for  adults born from [birth year] through 1965.   Completed  *Topic was postponed. The date shown is not the original due date.

## 2018-01-03 ENCOUNTER — Ambulatory Visit (INDEPENDENT_AMBULATORY_CARE_PROVIDER_SITE_OTHER): Payer: Medicare Other | Admitting: *Deleted

## 2018-01-03 DIAGNOSIS — I639 Cerebral infarction, unspecified: Secondary | ICD-10-CM | POA: Diagnosis not present

## 2018-01-04 ENCOUNTER — Telehealth: Payer: Self-pay | Admitting: Cardiology

## 2018-01-04 NOTE — Progress Notes (Signed)
Carelink Summary Report / Loop Recorder 

## 2018-01-04 NOTE — Telephone Encounter (Signed)
Spoke w/ pt and requested that he send a manual transmission b/c his home monitor has not updated in at least 14 days.   

## 2018-01-18 ENCOUNTER — Other Ambulatory Visit: Payer: Self-pay

## 2018-01-18 ENCOUNTER — Ambulatory Visit: Payer: Medicare Other | Admitting: Neurology

## 2018-01-18 ENCOUNTER — Emergency Department (HOSPITAL_COMMUNITY): Payer: Medicare Other

## 2018-01-18 ENCOUNTER — Encounter (HOSPITAL_COMMUNITY): Payer: Self-pay | Admitting: Emergency Medicine

## 2018-01-18 ENCOUNTER — Observation Stay (HOSPITAL_COMMUNITY)
Admission: EM | Admit: 2018-01-18 | Discharge: 2018-01-20 | Disposition: A | Discharge status: Home / Self Care | Payer: Medicare Other | Attending: Internal Medicine | Admitting: Internal Medicine

## 2018-01-18 DIAGNOSIS — E785 Hyperlipidemia, unspecified: Secondary | ICD-10-CM | POA: Insufficient documentation

## 2018-01-18 DIAGNOSIS — I200 Unstable angina: Secondary | ICD-10-CM

## 2018-01-18 DIAGNOSIS — E1165 Type 2 diabetes mellitus with hyperglycemia: Secondary | ICD-10-CM

## 2018-01-18 DIAGNOSIS — N183 Chronic kidney disease, stage 3 (moderate): Secondary | ICD-10-CM | POA: Insufficient documentation

## 2018-01-18 DIAGNOSIS — E871 Hypo-osmolality and hyponatremia: Secondary | ICD-10-CM | POA: Diagnosis not present

## 2018-01-18 DIAGNOSIS — F329 Major depressive disorder, single episode, unspecified: Secondary | ICD-10-CM | POA: Diagnosis not present

## 2018-01-18 DIAGNOSIS — I480 Paroxysmal atrial fibrillation: Secondary | ICD-10-CM | POA: Insufficient documentation

## 2018-01-18 DIAGNOSIS — M5030 Other cervical disc degeneration, unspecified cervical region: Secondary | ICD-10-CM | POA: Insufficient documentation

## 2018-01-18 DIAGNOSIS — D72829 Elevated white blood cell count, unspecified: Secondary | ICD-10-CM | POA: Insufficient documentation

## 2018-01-18 DIAGNOSIS — I2582 Chronic total occlusion of coronary artery: Secondary | ICD-10-CM | POA: Diagnosis not present

## 2018-01-18 DIAGNOSIS — Z789 Other specified health status: Secondary | ICD-10-CM

## 2018-01-18 DIAGNOSIS — R011 Cardiac murmur, unspecified: Secondary | ICD-10-CM | POA: Insufficient documentation

## 2018-01-18 DIAGNOSIS — Z86010 Personal history of colonic polyps: Secondary | ICD-10-CM | POA: Diagnosis not present

## 2018-01-18 DIAGNOSIS — E1122 Type 2 diabetes mellitus with diabetic chronic kidney disease: Secondary | ICD-10-CM | POA: Insufficient documentation

## 2018-01-18 DIAGNOSIS — Z8261 Family history of arthritis: Secondary | ICD-10-CM | POA: Insufficient documentation

## 2018-01-18 DIAGNOSIS — Z6836 Body mass index (BMI) 36.0-36.9, adult: Secondary | ICD-10-CM | POA: Insufficient documentation

## 2018-01-18 DIAGNOSIS — F419 Anxiety disorder, unspecified: Secondary | ICD-10-CM | POA: Diagnosis not present

## 2018-01-18 DIAGNOSIS — Z79899 Other long term (current) drug therapy: Secondary | ICD-10-CM | POA: Insufficient documentation

## 2018-01-18 DIAGNOSIS — I252 Old myocardial infarction: Secondary | ICD-10-CM | POA: Diagnosis not present

## 2018-01-18 DIAGNOSIS — K5900 Constipation, unspecified: Secondary | ICD-10-CM | POA: Insufficient documentation

## 2018-01-18 DIAGNOSIS — Z800 Family history of malignant neoplasm of digestive organs: Secondary | ICD-10-CM | POA: Insufficient documentation

## 2018-01-18 DIAGNOSIS — Z823 Family history of stroke: Secondary | ICD-10-CM | POA: Insufficient documentation

## 2018-01-18 DIAGNOSIS — Z7901 Long term (current) use of anticoagulants: Secondary | ICD-10-CM | POA: Insufficient documentation

## 2018-01-18 DIAGNOSIS — I25110 Atherosclerotic heart disease of native coronary artery with unstable angina pectoris: Principal | ICD-10-CM | POA: Insufficient documentation

## 2018-01-18 DIAGNOSIS — E119 Type 2 diabetes mellitus without complications: Secondary | ICD-10-CM

## 2018-01-18 DIAGNOSIS — Z888 Allergy status to other drugs, medicaments and biological substances status: Secondary | ICD-10-CM | POA: Insufficient documentation

## 2018-01-18 DIAGNOSIS — Z955 Presence of coronary angioplasty implant and graft: Secondary | ICD-10-CM | POA: Diagnosis not present

## 2018-01-18 DIAGNOSIS — K219 Gastro-esophageal reflux disease without esophagitis: Secondary | ICD-10-CM | POA: Diagnosis not present

## 2018-01-18 DIAGNOSIS — R079 Chest pain, unspecified: Secondary | ICD-10-CM | POA: Diagnosis present

## 2018-01-18 DIAGNOSIS — M1990 Unspecified osteoarthritis, unspecified site: Secondary | ICD-10-CM | POA: Insufficient documentation

## 2018-01-18 DIAGNOSIS — Z8249 Family history of ischemic heart disease and other diseases of the circulatory system: Secondary | ICD-10-CM | POA: Insufficient documentation

## 2018-01-18 DIAGNOSIS — Z833 Family history of diabetes mellitus: Secondary | ICD-10-CM | POA: Insufficient documentation

## 2018-01-18 DIAGNOSIS — I6502 Occlusion and stenosis of left vertebral artery: Secondary | ICD-10-CM | POA: Diagnosis not present

## 2018-01-18 DIAGNOSIS — Z8673 Personal history of transient ischemic attack (TIA), and cerebral infarction without residual deficits: Secondary | ICD-10-CM | POA: Diagnosis not present

## 2018-01-18 DIAGNOSIS — E1140 Type 2 diabetes mellitus with diabetic neuropathy, unspecified: Secondary | ICD-10-CM | POA: Insufficient documentation

## 2018-01-18 DIAGNOSIS — I130 Hypertensive heart and chronic kidney disease with heart failure and stage 1 through stage 4 chronic kidney disease, or unspecified chronic kidney disease: Secondary | ICD-10-CM | POA: Insufficient documentation

## 2018-01-18 DIAGNOSIS — I509 Heart failure, unspecified: Secondary | ICD-10-CM | POA: Insufficient documentation

## 2018-01-18 DIAGNOSIS — Z7982 Long term (current) use of aspirin: Secondary | ICD-10-CM | POA: Insufficient documentation

## 2018-01-18 DIAGNOSIS — E782 Mixed hyperlipidemia: Secondary | ICD-10-CM | POA: Diagnosis not present

## 2018-01-18 DIAGNOSIS — R0789 Other chest pain: Secondary | ICD-10-CM | POA: Diagnosis not present

## 2018-01-18 DIAGNOSIS — Z882 Allergy status to sulfonamides status: Secondary | ICD-10-CM | POA: Insufficient documentation

## 2018-01-18 DIAGNOSIS — Z87891 Personal history of nicotine dependence: Secondary | ICD-10-CM | POA: Insufficient documentation

## 2018-01-18 DIAGNOSIS — Z820 Family history of epilepsy and other diseases of the nervous system: Secondary | ICD-10-CM | POA: Insufficient documentation

## 2018-01-18 DIAGNOSIS — R55 Syncope and collapse: Secondary | ICD-10-CM | POA: Diagnosis not present

## 2018-01-18 DIAGNOSIS — Z794 Long term (current) use of insulin: Secondary | ICD-10-CM | POA: Insufficient documentation

## 2018-01-18 LAB — GLUCOSE, CAPILLARY
Glucose-Capillary: 343 mg/dL — ABNORMAL HIGH (ref 70–99)
Glucose-Capillary: 428 mg/dL — ABNORMAL HIGH (ref 70–99)
Glucose-Capillary: 339 mg/dL — ABNORMAL HIGH (ref 70–99)
Glucose-Capillary: 300 mg/dL — ABNORMAL HIGH (ref 70–99)

## 2018-01-18 LAB — I-STAT TROPONIN, ED: Troponin i, poc: 0.01 ng/mL (ref 0.00–0.08)

## 2018-01-18 LAB — BASIC METABOLIC PANEL
Anion gap: 9 (ref 5–15)
BUN: 23 mg/dL (ref 8–23)
CO2: 28 mmol/L (ref 22–32)
Calcium: 9.9 mg/dL (ref 8.9–10.3)
Chloride: 94 mmol/L — ABNORMAL LOW (ref 98–111)
Creatinine, Ser: 1.42 mg/dL — ABNORMAL HIGH (ref 0.61–1.24)
GFR calc Af Amer: 58 mL/min — ABNORMAL LOW (ref >60)
GFR calc non Af Amer: 50 mL/min — ABNORMAL LOW (ref >60)
Glucose, Bld: 353 mg/dL — ABNORMAL HIGH (ref 70–99)
Potassium: 4.5 mmol/L (ref 3.5–5.1)
Sodium: 131 mmol/L — ABNORMAL LOW (ref 135–145)

## 2018-01-18 LAB — CBC
HCT: 41.8 % (ref 39.0–52.0)
Hemoglobin: 14 g/dL (ref 13.0–17.0)
MCH: 30.8 pg (ref 26.0–34.0)
MCHC: 33.5 g/dL (ref 30.0–36.0)
MCV: 92.1 fL (ref 78.0–100.0)
Platelets: 303 10*3/uL (ref 150–400)
RBC: 4.54 MIL/uL (ref 4.22–5.81)
RDW: 12.9 % (ref 11.5–15.5)
WBC: 15.9 10*3/uL — ABNORMAL HIGH (ref 4.0–10.5)

## 2018-01-18 LAB — CUP PACEART REMOTE DEVICE CHECK
Date Time Interrogation Session: 20190718003535
Implantable Pulse Generator Implant Date: 20180108

## 2018-01-18 LAB — MAGNESIUM: Magnesium: 2.1 mg/dL (ref 1.7–2.4)

## 2018-01-18 LAB — TROPONIN I
Troponin I: 0.03 ng/mL (ref <0.03)
Troponin I: 0.03 ng/mL (ref <0.03)

## 2018-01-18 MED ORDER — INSULIN ASPART 100 UNIT/ML ~~LOC~~ SOLN
5.0000 [IU] | Freq: Three times a day (TID) | SUBCUTANEOUS | Status: DC
  Administered 2018-01-18 (×2): 5 [IU] via SUBCUTANEOUS

## 2018-01-18 MED ORDER — ONDANSETRON HCL 4 MG/2ML IJ SOLN: 4.0000 mg | Freq: Four times a day (QID) | INTRAMUSCULAR | Status: DC | PRN

## 2018-01-18 MED ORDER — HYDROCODONE-ACETAMINOPHEN 10-325 MG PO TABS: 1.0000 | ORAL_TABLET | Freq: Three times a day (TID) | ORAL | Status: DC | PRN

## 2018-01-18 MED ORDER — INSULIN ASPART 100 UNIT/ML ~~LOC~~ SOLN: 0.0000 [IU] | Freq: Three times a day (TID) | SUBCUTANEOUS | Status: DC

## 2018-01-18 MED ORDER — NITROGLYCERIN 0.4 MG SL SUBL: 0.4000 mg | SUBLINGUAL_TABLET | SUBLINGUAL | Status: DC | PRN

## 2018-01-18 MED ORDER — ALUM & MAG HYDROXIDE-SIMETH 200-200-20 MG/5ML PO SUSP
30.0000 mL | ORAL | Status: DC | PRN
  Administered 2018-01-18: 30 mL via ORAL
  Filled 2018-01-18: qty 30

## 2018-01-18 MED ORDER — PANTOPRAZOLE SODIUM 40 MG PO TBEC
40.0000 mg | DELAYED_RELEASE_TABLET | Freq: Every day | ORAL | Status: DC
  Administered 2018-01-18 – 2018-01-20 (×3): 40 mg via ORAL
  Filled 2018-01-18 (×3): qty 1

## 2018-01-18 MED ORDER — SODIUM CHLORIDE 0.9% FLUSH
3.0000 mL | Freq: Two times a day (BID) | INTRAVENOUS | Status: DC
  Administered 2018-01-18 – 2018-01-19 (×2): 3 mL via INTRAVENOUS

## 2018-01-18 MED ORDER — SODIUM CHLORIDE 0.9 % WEIGHT BASED INFUSION
1.0000 mL/kg/h | INTRAVENOUS | Status: DC
  Administered 2018-01-19: 1 mL/kg/h via INTRAVENOUS

## 2018-01-18 MED ORDER — FUROSEMIDE 20 MG PO TABS
20.0000 mg | ORAL_TABLET | Freq: Every day | ORAL | Status: DC
  Administered 2018-01-18: 20 mg via ORAL
  Filled 2018-01-18: qty 1

## 2018-01-18 MED ORDER — DULOXETINE HCL 30 MG PO CPEP
30.0000 mg | ORAL_CAPSULE | Freq: Every day | ORAL | Status: DC
  Administered 2018-01-18 – 2018-01-19 (×2): 30 mg via ORAL
  Filled 2018-01-18 (×2): qty 1

## 2018-01-18 MED ORDER — ENOXAPARIN SODIUM 40 MG/0.4ML ~~LOC~~ SOLN: 40.0000 mg | SUBCUTANEOUS | Status: DC

## 2018-01-18 MED ORDER — IRBESARTAN 150 MG PO TABS
150.0000 mg | ORAL_TABLET | Freq: Every day | ORAL | Status: DC
  Administered 2018-01-18: 150 mg via ORAL
  Filled 2018-01-18: qty 1

## 2018-01-18 MED ORDER — INSULIN ASPART 100 UNIT/ML ~~LOC~~ SOLN
0.0000 [IU] | Freq: Three times a day (TID) | SUBCUTANEOUS | Status: DC
  Administered 2018-01-18: 20 [IU] via SUBCUTANEOUS
  Administered 2018-01-18 – 2018-01-19 (×3): 15 [IU] via SUBCUTANEOUS
  Administered 2018-01-19: 4 [IU] via SUBCUTANEOUS
  Administered 2018-01-20: 11 [IU] via SUBCUTANEOUS

## 2018-01-18 MED ORDER — ASPIRIN 81 MG PO CHEW
81.0000 mg | CHEWABLE_TABLET | ORAL | Status: AC
  Administered 2018-01-19: 81 mg via ORAL
  Filled 2018-01-18: qty 1

## 2018-01-18 MED ORDER — SODIUM CHLORIDE 0.9 % WEIGHT BASED INFUSION
3.0000 mL/kg/h | INTRAVENOUS | Status: DC
  Administered 2018-01-19: 3 mL/kg/h via INTRAVENOUS

## 2018-01-18 MED ORDER — ACETAMINOPHEN 325 MG PO TABS
650.0000 mg | ORAL_TABLET | ORAL | Status: DC | PRN
  Administered 2018-01-18: 650 mg via ORAL
  Filled 2018-01-18: qty 2

## 2018-01-18 MED ORDER — DULOXETINE HCL 30 MG PO CPEP
30.0000 mg | ORAL_CAPSULE | Freq: Every day | ORAL | Status: DC
  Filled 2018-01-18: qty 1

## 2018-01-18 MED ORDER — INSULIN NPH (HUMAN) (ISOPHANE) 100 UNIT/ML ~~LOC~~ SUSP
35.0000 [IU] | Freq: Two times a day (BID) | SUBCUTANEOUS | Status: DC
  Administered 2018-01-18 – 2018-01-19 (×3): 35 [IU] via SUBCUTANEOUS
  Filled 2018-01-18 (×2): qty 10

## 2018-01-18 MED ORDER — APIXABAN 5 MG PO TABS: 5.0000 mg | ORAL_TABLET | Freq: Two times a day (BID) | ORAL | Status: DC

## 2018-01-18 MED ORDER — GABAPENTIN 300 MG PO CAPS
600.0000 mg | ORAL_CAPSULE | Freq: Two times a day (BID) | ORAL | Status: DC
  Administered 2018-01-18 – 2018-01-20 (×5): 600 mg via ORAL
  Filled 2018-01-18 (×5): qty 2

## 2018-01-18 MED ORDER — ONDANSETRON HCL 4 MG/2ML IJ SOLN
4.0000 mg | Freq: Once | INTRAMUSCULAR | Status: AC
  Administered 2018-01-18: 4 mg via INTRAVENOUS
  Filled 2018-01-18: qty 2

## 2018-01-18 MED ORDER — SODIUM CHLORIDE 0.9 % IV SOLN: 250.0000 mL | INTRAVENOUS | Status: DC | PRN

## 2018-01-18 MED ORDER — ISOSORBIDE MONONITRATE ER 30 MG PO TB24
30.0000 mg | ORAL_TABLET | Freq: Every day | ORAL | Status: DC
  Administered 2018-01-18 – 2018-01-19 (×2): 30 mg via ORAL
  Filled 2018-01-18 (×2): qty 1

## 2018-01-18 MED ORDER — INSULIN ASPART 100 UNIT/ML ~~LOC~~ SOLN
0.0000 [IU] | Freq: Every day | SUBCUTANEOUS | Status: DC
  Administered 2018-01-18: 3 [IU] via SUBCUTANEOUS

## 2018-01-18 MED ORDER — ENOXAPARIN SODIUM 40 MG/0.4ML ~~LOC~~ SOLN
40.0000 mg | SUBCUTANEOUS | Status: DC
  Administered 2018-01-18: 40 mg via SUBCUTANEOUS
  Filled 2018-01-18: qty 0.4

## 2018-01-18 MED ORDER — LINACLOTIDE 72 MCG PO CAPS
72.0000 ug | ORAL_CAPSULE | Freq: Every day | ORAL | Status: DC | PRN
  Filled 2018-01-18: qty 1

## 2018-01-18 MED ORDER — SODIUM CHLORIDE 0.9% FLUSH: 3.0000 mL | INTRAVENOUS | Status: DC | PRN

## 2018-01-18 MED ORDER — ONDANSETRON HCL 4 MG PO TABS: 4.0000 mg | ORAL_TABLET | Freq: Four times a day (QID) | ORAL | Status: DC | PRN

## 2018-01-18 MED ORDER — INSULIN ASPART 100 UNIT/ML ~~LOC~~ SOLN: 0.0000 [IU] | Freq: Every day | SUBCUTANEOUS | Status: DC

## 2018-01-18 MED ORDER — MORPHINE SULFATE (PF) 4 MG/ML IV SOLN
4.0000 mg | Freq: Once | INTRAVENOUS | Status: AC
  Administered 2018-01-18: 4 mg via INTRAVENOUS
  Filled 2018-01-18: qty 1

## 2018-01-18 MED ORDER — FENOFIBRATE 160 MG PO TABS
160.0000 mg | ORAL_TABLET | Freq: Every day | ORAL | Status: DC
  Administered 2018-01-18 – 2018-01-20 (×3): 160 mg via ORAL
  Filled 2018-01-18 (×3): qty 1

## 2018-01-18 MED ORDER — CLOPIDOGREL BISULFATE 75 MG PO TABS
75.0000 mg | ORAL_TABLET | Freq: Every day | ORAL | Status: DC
  Administered 2018-01-18 – 2018-01-20 (×3): 75 mg via ORAL
  Filled 2018-01-18 (×3): qty 1

## 2018-01-18 NOTE — H&P (Addendum)
History and Physical    [name] [name] OVF:[id number] DOB: 14-Jun-1950 DOA: 01/18/2018  PCP: [name] Balloon, FNP  Patient coming from: home  I have personally briefly reviewed patient's old medical records in [place]  Chief Complaint: chest pain  HPI: [name] [name] is [name] [name] 67 y.o. male with medical history significant of coronary disease, type 2 diabetes, heart failure preserved ejection fraction, anxiety, depression, [name] [name]. fib on Eliquis presenting with chest pain.  Patient notes that his symptoms started yet started yesterday.  They started around 4:00 in the afternoon.  He describes having shooting pain on the left side of his chest that felt like pressure.  This lasted until he took nitroglycerin.  The pain recurred around 6:00 and he took another nitro which improved the pain.  After this he slept until about 1 AM and woke up with chest pain.  He came to the emergency department after this.  He received aspirin with EMS.  He denies any nausea or sweating or numbness.  He denies any vomiting.  He denies any shortness of breath.  He denies any worsening of the chest pain with exertion.  He denies any improvement with rest.  He has [name] [name] history of drug-eluting stent to CTO of RCA in 2010 and recent catheterization in March 2018 which showed total occlusion of the right coronary artery and severe complex disease in the left circumflex which was treated with 2 drug-eluting stents.    ED Course: In the ED the patient received aspirin and nitroglycerin.  He received [name] [name] head CT for headache after the nitroglycerin.  EKG and chest x-[name] [name] as well.  Review of Systems: As per HPI otherwise 10 point review of systems negative.   Past Medical History:  Diagnosis Date  . Anxiety   . Arthritis   . CAD (coronary artery disease)    [name] [name]. 2010: DES to CTO of RCA. EF 55% c. 07/2016: cath showing total occlusion within previously placed RCA stent (collaterals present), severe stenosis along LCx and OM1 (treated with  2 overlapping DES).   . Cellulitis and abscess rt groin   . Depression   . Diabetes mellitus   . Disorders of iron metabolism   . GERD (gastroesophageal reflux disease)   . Hyperlipidemia   . Hypertension   . Low serum testosterone level   . Medically noncompliant   . Myocardial infarction [name] [name])     Past Surgical History:  Procedure Laterality Date  . BACK SURGERY  2015   ACDF by [name] [name]  . COLONOSCOPY N/[name] [name] 10/01/2014   Procedure: COLONOSCOPY;  Surgeon: [name] Dolin, MD;  Location: [facility];  Service: Endoscopy;  Laterality: N/[name];  815  . CORONARY STENT INTERVENTION N/[name] 07/30/2016   Procedure: Coronary Stent Intervention;  Surgeon: [name] Mocha, MD;  Location: [place] [facility];  Service: Cardiovascular;  Laterality: [name];  . CORONARY STENT PLACEMENT  2000   By Dr. Olevia [name]  . EP IMPLANTABLE DEVICE N/[name] [name] 05/25/2016   Procedure: Loop Recorder Insertion;  Surgeon: [name] Lance, MD;  Location: [place] [facility];  Service: Cardiovascular;  Laterality: N/[name] [name];  . ESOPHAGOGASTRODUODENOSCOPY     esophagus stretched remotely at [facility]  . ESOPHAGOGASTRODUODENOSCOPY N/Yerania Chamorro 10/01/2014   Procedure: ESOPHAGOGASTRODUODENOSCOPY (EGD);  Surgeon: [name] Dolin, MD;  Location: [facility];  Service: Endoscopy;  Laterality: N/[name] [name];  . HERNIA REPAIR  [id number]   umbilical  . LEFT HEART CATH AND CORONARY ANGIOGRAPHY N/[name] [name] 07/30/2016   Procedure: Left Heart Cath and  Coronary Angiography;  Surgeon: [name] Mocha, MD;  Location: [place] [facility];  Service: Cardiovascular;  Laterality: N/[name];  . LESION REMOVAL     Lip and hand   . NECK SURGERY       reports that he quit smoking about 17 months ago. His smoking use included cigarettes. He has [name] 12.75 pack-year smoking history. He has never used smokeless tobacco. He reports that he does not drink alcohol or use drugs.  Allergies  Allergen Reactions  . Shellfish Allergy Anaphylaxis and Other (See Comments)    Tongue swelling, hives Tongue swelling, hives  .  Sulfa Antibiotics Anaphylaxis and Rash    Tongue swelling, hives  . Ace Inhibitors Other (See Comments) and Cough    CKD, renal failure   . Invokana [Canagliflozin] Other (See Comments)    Syncope / dehydration  . Metformin And Related Itching  . Pravastatin Sodium Other (See Comments)    myalgias  . Fenofibrate Other (See Comments)    Body aches  . Iodine Other (See Comments)    ????  . Lisinopril Cough  . Livalo [Pitavastatin] Other (See Comments)    Myalgias   . Repatha [Evolocumab]     Myalgias, flu like sx  . Crestor [Rosuvastatin] Other (See Comments)    Myalgias   . Horse-Derived Products Rash  . Lexapro [Escitalopram Oxalate] Other (See Comments)    Buzzing in ears,headache, felt like [name] [name] zombie  . Lipitor [Atorvastatin] Other (See Comments)    myalgias  . Tape Rash    Family History  Problem Relation Age of Onset  . Diabetes Father   . Valvular heart disease Father   . Arthritis Father   . Heart disease Father   . Stroke Father   . Alzheimer's disease Mother   . Hyperlipidemia Mother   . Hypertension Mother   . Arthritis Mother   . Lung cancer Mother   . Stroke Mother   . Arthritis/Rheumatoid Sister   . Diabetes Sister   . Hypertension Sister   . Hyperlipidemia Sister   . Depression Sister   . Dementia Maternal Aunt   . Dementia Maternal Uncle   . Heart disease Maternal Uncle   . Stomach cancer Paternal Uncle   . Colon cancer Neg Hx   . Liver disease Neg Hx     Prior to Admission medications   Medication Sig Start Date End Date Taking? Authorizing Provider  clopidogrel (PLAVIX) 75 MG tablet TAKE 1 TABLET BY MOUTH ONCE DAILY WITH BREAKFAST 12/10/17  Yes [name] [name], [name] Cilicia Borden, FNP  DULoxetine (CYMBALTA) 30 MG capsule Take 1 capsule (30 mg total) by mouth daily. 12/10/17   [name] Balloon, FNP  ELIQUIS 5 MG TABS tablet TAKE 1 TABLET BY MOUTH TWICE DAILY 08/30/17   Arnoldo Lenis, MD  fenofibrate 160 MG tablet TAKE 1 TABLET BY MOUTH ONCE DAILY FOR  CHOLESTEROL AND  TRIGLYCERIDE 12/10/17   [name] Quintella Mura, FNP  furosemide (LASIX) 20 MG tablet Take 1 tablet (20 mg total) by mouth daily. 10/21/17   [name] Balloon, FNP  gabapentin (NEURONTIN) 300 MG capsule Take 2 capsules (600 mg total) by mouth 2 (two) times daily. 10/25/17   [name] Balloon, FNP  HYDROcodone-acetaminophen (NORCO) 10-325 MG tablet Take 1-2 tablets by mouth every 8 (eight) hours as needed for moderate pain.  01/10/16   [provider]  Insulin Degludec (TRESIBA FLEXTOUCH) 200 UNIT/ML SOPN Inject 16 Units into the skin at bedtime. 12/14/17   [name] Balloon,  FNP  insulin lispro (HUMALOG KWIKPEN) 100 UNIT/ML KiwkPen Inject 0.05-0.2 mLs (5-20 Units total) into the skin 3 (three) times daily with meals. Give enough for 1 month 11/26/17   Dettinger, Fransisca Kaufmann, MD  insulin NPH-regular Human (HUMULIN 70/30) (70-30) 100 UNIT/ML injection Inject 90 Units into the skin 2 (two) times daily with Treyvon Blahut meal. 11/09/17   [name] [name], Alyse Low Carrieann Spielberg, FNP  isosorbide mononitrate (IMDUR) 30 MG 24 hr tablet Take 1 tablet (30 mg total) by mouth daily. 12/10/17   [name] Annjeanette Sarwar, FNP  Lactobacillus (PROBIOTIC ACIDOPHILUS PO) Take 1 capsule by mouth daily.    [provider]  linaclotide (LINZESS) 72 MCG capsule Take 1 capsule (72 mcg total) by mouth daily before breakfast. Patient taking differently: Take 72 mcg by mouth daily as needed.  08/26/16   [name] [name], Alyse Low Shalice Woodring, FNP  mometasone (NASONEX) 50 MCG/ACT nasal spray Place 2 sprays into the nose as needed.    [provider]  nitroGLYCERIN (NITROSTAT) 0.4 MG SL tablet Place 1 tablet (0.4 mg total) under the tongue every 5 (five) minutes x 3 doses as needed for chest pain. 09/06/17   Strader, Fransisco Hertz, PA-C  pantoprazole (PROTONIX) 40 MG tablet Take 1 tablet (40 mg total) by mouth daily. 12/10/17   [name] [name], [name] Dari Carpenito, FNP  Semaglutide (OZEMPIC) 0.25 or 0.5 MG/DOSE SOPN Inject 0.25 mg into the skin once Reggie Welge week for 28 days, THEN 0.5 mg once Nalayah Hitt week for  28 days. 12/10/17 02/04/18  [name] Balloon, FNP  telmisartan (MICARDIS) 40 MG tablet TAKE ONE TABLET BY MOUTH ONCE DAILY (REPLACING LISINORIL) 09/21/17   [name] Balloon, FNP    Physical Exam: Vitals:   01/18/18 0300 01/18/18 0400 01/18/18 0500 01/18/18 0535  BP: 123/80 126/65 (!) 99/57 110/66  Pulse: 93 93 87 82  Resp: (!) 23  16 20   Temp:    97.9 F (36.6 C)  TempSrc:    Oral  SpO2: 95% 92% 97% 98%  Weight: 130.2 kg   129.5 kg  Height: 6\' 3"  (1.905 m)   6\' 3"  (1.905 m)    Constitutional: NAD, calm, comfortable Vitals:   01/18/18 0300 01/18/18 0400 01/18/18 0500 01/18/18 0535  BP: 123/80 126/65 (!) 99/57 110/66  Pulse: 93 93 87 82  Resp: (!) 23  16 20   Temp:    97.9 F (36.6 C)  TempSrc:    Oral  SpO2: 95% 92% 97% 98%  Weight: 130.2 kg   129.5 kg  Height: 6\' 3"  (1.905 m)   6\' 3"  (1.905 m)   Eyes: PERRL, lids and conjunctivae normal ENMT: Mucous membranes are moist. Posterior pharynx clear of any exudate or lesions.Normal dentition.  Neck: normal, supple, no masses, no thyromegaly Respiratory: clear to auscultation bilaterally, no wheezing, no crackles. Normal respiratory effort. No accessory muscle use.  Cardiovascular: Regular rate and rhythm, no murmurs / rubs / gallops. No extremity edema. 2+ pedal pulses. No carotid bruits.  Abdomen: no tenderness, no masses palpated. No hepatosplenomegaly. Bowel sounds positive.  Musculoskeletal: no clubbing / cyanosis. No joint deformity upper and lower extremities. Good ROM, no contractures. Normal muscle tone.  Skin: no rashes, lesions, ulcers. No induration Neurologic: CN 2-12 grossly intact. Sensation intact, DTR normal. Strength 5/5 in all 4.  Psychiatric: Normal judgment and insight. Alert and oriented x 3. Normal mood.   Labs on Admission: I have personally reviewed following labs and imaging studies  CBC: Recent Labs  Lab 01/18/18 0310  WBC 15.9*  HGB 14.0  HCT 41.8  MCV 92.1  PLT 390   Basic Metabolic  Panel: Recent Labs  Lab 01/18/18 0310  NA 131*  K 4.5  CL 94*  CO2 28  GLUCOSE 353*  BUN 23  CREATININE 1.42*  CALCIUM 9.9  MG 2.1   GFR: Estimated Creatinine Clearance: 73.2 mL/min (Bruin Bolger) (by C-G formula based on SCr of 1.42 mg/dL (H)). Liver Function Tests: No results for input(s): AST, ALT, ALKPHOS, BILITOT, PROT, ALBUMIN in the last 168 hours. No results for input(s): LIPASE, AMYLASE in the last 168 hours. No results for input(s): AMMONIA in the last 168 hours. Coagulation Profile: No results for input(s): INR, PROTIME in the last 168 hours. Cardiac Enzymes: Recent Labs  Lab 01/18/18 0908  TROPONINI 0.03*   BNP (last 3 results) No results for input(s): PROBNP in the last 8760 hours. HbA1C: No results for input(s): HGBA1C in the last 72 hours. CBG: Recent Labs  Lab 01/18/18 0731  GLUCAP 343*   Lipid Profile: No results for input(s): CHOL, HDL, LDLCALC, TRIG, CHOLHDL, LDLDIRECT in the last 72 hours. Thyroid Function Tests: No results for input(s): TSH, T4TOTAL, FREET4, T3FREE, THYROIDAB in the last 72 hours. Anemia Panel: No results for input(s): VITAMINB12, FOLATE, FERRITIN, TIBC, IRON, RETICCTPCT in the last 72 hours. Urine analysis:    Component Value Date/Time   COLORURINE YELLOW 05/21/2016 1350   APPEARANCEUR HAZY (Dealva Lafoy) 05/21/2016 1350   APPEARANCEUR Clear 02/11/2016 1149   LABSPEC 1.019 05/21/2016 1350   PHURINE 5.0 05/21/2016 1350   GLUCOSEU 50 (Hollynn Garno) 05/21/2016 1350   HGBUR SMALL (Topacio Cella) 05/21/2016 1350   BILIRUBINUR NEGATIVE 05/21/2016 1350   BILIRUBINUR Negative 02/11/2016 1149   KETONESUR NEGATIVE 05/21/2016 1350   PROTEINUR 100 (Delania Ferg) 05/21/2016 1350   UROBILINOGEN negative 08/13/2014 1725   UROBILINOGEN 0.2 08/07/2013 1747   NITRITE NEGATIVE 05/21/2016 1350   LEUKOCYTESUR NEGATIVE 05/21/2016 1350   LEUKOCYTESUR Negative 02/11/2016 1149    Radiological Exams on Admission: Dg Chest 2 View  Result Date: 01/18/2018 CLINICAL DATA:  67 year old male with  chest pain. EXAM: CHEST - 2 VIEW COMPARISON:  Chest radiograph dated 07/19/2016 FINDINGS: Stable mild cardiomegaly. No focal consolidation, pleural effusion, or pneumothorax. Minimal left lung base atelectatic changes. Loop recorder device. Partially visualized lower cervical fusion plate. No acute osseous pathology. IMPRESSION: No active cardiopulmonary disease. Electronically Signed   By: Anner Crete M.D.   On: 01/18/2018 03:57   Ct Head Wo Contrast  Result Date: 01/18/2018 CLINICAL DATA:  Syncopal episodes. Headache and chest pain. History of LEFT vertebral artery occlusion, hypertension, diabetes, hyperlipidemia. EXAM: CT HEAD WITHOUT CONTRAST TECHNIQUE: Contiguous axial images were obtained from the base of the skull through the vertex without intravenous contrast. COMPARISON:  MRI head May 22, 2016 FINDINGS: BRAIN: No intraparenchymal hemorrhage, mass effect nor midline shift. The ventricles and sulci are normal for age. Minimal supratentorial white matter hypodensities within normal range for patient's age, though non-specific are most compatible with chronic small vessel ischemic disease. No acute large vascular territory infarcts. Old LEFT basal ganglia lacunar infarct. No abnormal extra-axial fluid collections. Basal cisterns are patent. VASCULAR: Moderate calcific atherosclerosis of the carotid siphons. SKULL: No skull fracture. Old nondisplaced LEFT and possibly RIGHT nasal bone fractures. No significant scalp soft tissue swelling. SINUSES/ORBITS: Trace paranasal sinus mucosal thickening. Mastoid air cells are well aerated.The included ocular globes and orbital contents are non-suspicious. OTHER: None. IMPRESSION: 1. No acute intracranial process. 2. Old LEFT basal ganglia lacunar infarct, otherwise negative non-contrast CT HEAD for age. Electronically  Signed   By: Elon Alas M.D.   On: 01/18/2018 04:02    EKG: Independently reviewed. NSR.  T wave inversions in I, aVL, V5, and V6  that appear chronic.  Appears similar to prior ekgs.  Assessment/Plan Active Problems:   Chest pain  Chest Pain  Coronary Artery Disease: pt with typical symptoms concerning for unstable angina.  CP currently resolved.  Pt with hx of DES to RCA in 2010.  Had cath 07/2016 with total occlusion of RCA as well as severe complex disease in the L circumflex which was treated with 2 DES.  EKG appears c/w priors.  CXR without acute findings. Cards c/s, appreciate recs - planning to hold eliquis and ARB and planning for angiography Follow troponins, repeat EKG prn sx S/p ASA Continue plavix Follow A1c and lipids Will transfer to [facility] for catheterization  Atrial Fibrillation: currently holding eliquis given above  Hx of CVA: no new symptoms  HFpEF: appears euvolemic, holding lasix  HTN: holding lasix and telmisartan, continue imdur  T2DM: takes 100 70/30 BID at home as well as tresiba and ozempic.  35 NPH BID here with 5 units TID with meals and SSI.  Pt will be NPO at [facility]  HLD: continue fenofibrate.  Pt has hx of statin intolerance and intolerance of repatha per cards.  Chronic Pain: continue norco, PMP aware reviewed, continue gabapentin   Depression  Anxiety: continue cymbalta  CKD stage III: creatinine appears close to baseline, follow closely.  Hold lasix and arb.  Hyponatremia: likely related to hyperglycemia, follow  Leukocytosis: follow, no si/sx of infection  Gerd: PPI  DVT prophylaxis: eliquis, on hold for cath Code Status: DNR, confirmed at bedside with discussion with pt and his wife Family Communication: wife at bedside  Disposition Plan: transfer to [facility] Consults called: cardiology  Admission status: observation   Fayrene Helper MD [facility] Pager [phone]  If 7PM-7AM, please contact night-coverage [email or web address] Password [email or web address]  01/18/2018, 10:15 AM

## 2018-01-18 NOTE — Progress Notes (Signed)
MD made aware of latest troponin 0.03.  Cardio already planning to send to [facility] for cath tomorrow.

## 2018-01-18 NOTE — ED Triage Notes (Signed)
Patient started having cp at 4 pm in the evening yesterday. Patient took 1 nitro at 4:55 PM 2nd nitro at 6:54pm. Patient took nightly meds. Patient took Plavix at 1 am. EMS came out to check patient out this a.m. and 12-lead looked normal at the time. Patient took four 53 MG's of ASA per EMD protocol before EMS arrived. Patient conscious and alert at this time. Patient denies any pain. CBG per EMS 357. Patient denies any pain at this time.

## 2018-01-18 NOTE — Care Management Obs Status (Signed)
[place] NOTIFICATION   Patient Details  Name: MATTHER LABELL MRN: [id number] Date of Birth: 05-12-1951   Medicare Observation Status Notification Given:  Yes    Sherald Barge, RN 01/18/2018, 2:33 PM

## 2018-01-18 NOTE — Progress Notes (Signed)
Inpatient Diabetes Program Recommendations  AACE/ADA: New Consensus Statement on Inpatient Glycemic Control (2015)  Target Ranges:  Prepandial:   less than 140 mg/dL      Peak postprandial:   less than 180 mg/dL (1-2 hours)      Critically ill patients:  140 - 180 mg/dL   Lab Results  Component Value Date   GLUCAP 428 (H) 01/18/2018   HGBA1C 10.0 (H) 05/21/2016    Review of Glycemic Control  Diabetes history: DM2 Outpatient Diabetes medications: Tresiba 16 units + Humulin 70/30 insulin 90 units bid + Humalog correction scale 5-20 units tid Current orders for Inpatient glycemic control: NPH 35 units bid + Novolog 5 units tid + Novolog resistant correction + hs  Inpatient Diabetes Program Recommendations:    Spoke with patient by phone (DM coordinator off [facility]): And verified that he currently takes [place] in addition to 70/30 + Humalog. -Change insulin to Levemir 50 units bid while NPO starting tonight and increase Novolog meal coverage to 10 units tid if eats 50%.  Thank you, Nani Gasser. Lannie Heaps, RN, MSN, CDE  Diabetes Coordinator Inpatient Glycemic Control Team Team Pager [phone] (8am-5pm) 01/18/2018 3:28 PM

## 2018-01-18 NOTE — Progress Notes (Signed)
Pt refuses to have groin areas shaved. He stated that "he would not have the procedure if it could not be done in the radial, that he has had a radial cath in the past."

## 2018-01-18 NOTE — Consult Note (Addendum)
Cardiology Consultation:   Patient ID: EDWEN MCLESTER; [id number]; Jul 12, 1950   Admit date: 01/18/2018 Date of Consult: 01/18/2018  Primary Care Provider: [name], FNP Primary Cardiologist: Dr. Carlyle Dolly Primary Electrophysiologist:  Dr. Cristopher Peru   Patient Profile:   [name] [name] is a 67 y.o. male with a history of CAD status post DES to the RCA in 2010 with follow-up angiography in March 2018 showing occlusion of the RCA stent site associated with collaterals and severe disease within the circumflex/OM system requiring DES x2, type 2 diabetes mellitus, hyperlipidemia, paroxysmal atrial fibrillation on Eliquis and hypertension who is being seen today for the evaluation of recurring chest pain at the request of Dr. Florene Glen.  History of Present Illness:   [name] [name] states that he was watching television yesterday afternoon around 4:00 PM and suddenly developed a feeling of chest tightness.  He took a single nitroglycerin ultimately with resolution of symptoms within a few minutes.  He dozed off and then when he awoke again at 6:30 PM he was experiencing chest tightness again, requiring an additional nitroglycerin.  He went to bed later that evening, woke up again at 1 AM with worsening chest tightness, took additional nitroglycerin and summoned EMS.  He was admitted to the hospital this morning for observation.  He has already eaten breakfast.  I reviewed his cardiac history which is detailed below.  He reports compliance with his medications including Eliquis and Plavix which he has maintained since intervention last year.  ECG shows nonspecific ST-T wave changes predominantly in the lateral leads.  Initial troponin I is negative.  Chest x-Vadim reported no acute findings.  Outpatient cardiac regimen includes Plavix, Eliquis, fenofibrate, Lasix, Micardis, Imdur, and as needed nitroglycerin.  Past Medical History:  Diagnosis Date  . Anxiety   . Arthritis   . CAD (coronary  artery disease)    a. 2010: DES to CTO of RCA. EF 55% c. 07/2016: cath showing total occlusion within previously placed RCA stent (collaterals present), severe stenosis along LCx and OM1 (treated with 2 overlapping DES).   . Cellulitis and abscess rt groin   . Depression   . Diabetes mellitus   . Disorders of iron metabolism   . GERD (gastroesophageal reflux disease)   . Hyperlipidemia   . Hypertension   . Low serum testosterone level   . Medically noncompliant   . Myocardial infarction [facility]     Past Surgical History:  Procedure Laterality Date  . BACK SURGERY  2015   ACDF by Dr. [name]  . COLONOSCOPY N/A 10/01/2014   Procedure: COLONOSCOPY;  Surgeon: Daneil Dolin, MD;  [facility];  Service: Endoscopy;  Laterality: N/A;  815  . CORONARY STENT INTERVENTION N/A 07/30/2016   Procedure: Coronary Stent Intervention;  Surgeon: [name], MD;  Location: [place] [facility];  Service: Cardiovascular;  Laterality: N/A;  . CORONARY STENT PLACEMENT  2000   By Dr. Olevia Perches  . EP IMPLANTABLE DEVICE N/A 05/25/2016   Procedure: Loop Recorder Insertion;  Surgeon: [name], MD;  Location: [place] [facility];  Service: Cardiovascular;  Laterality: N/A;  . ESOPHAGOGASTRODUODENOSCOPY     esophagus stretched remotely at [facility]  . ESOPHAGOGASTRODUODENOSCOPY N/A 10/01/2014   Procedure: ESOPHAGOGASTRODUODENOSCOPY (EGD);  Surgeon: Daneil Dolin, MD;  Location: [facility];  Service: Endoscopy;  Laterality: N/A;  . HERNIA REPAIR  1937   umbilical  . LEFT HEART CATH AND CORONARY ANGIOGRAPHY N/A 07/30/2016   Procedure: Left  Heart Cath and Coronary Angiography;  Surgeon: [name], MD;  Location: [place] [facility];  Service: Cardiovascular;  Laterality: N/A;  . LESION REMOVAL     Lip and hand   . NECK SURGERY       Outpatient Medications: No current facility-administered medications on file prior to encounter.    Current Outpatient Medications on File Prior to Encounter    Medication Sig Dispense Refill  . clopidogrel (PLAVIX) 75 MG tablet TAKE 1 TABLET BY MOUTH ONCE DAILY WITH BREAKFAST 90 tablet 3  . DULoxetine (CYMBALTA) 30 MG capsule Take 1 capsule (30 mg total) by mouth daily. 90 capsule 1  . ELIQUIS 5 MG TABS tablet TAKE 1 TABLET BY MOUTH TWICE DAILY 180 tablet 2  . fenofibrate 160 MG tablet TAKE 1 TABLET BY MOUTH ONCE DAILY FOR CHOLESTEROL AND  TRIGLYCERIDE 90 tablet 2  . furosemide (LASIX) 20 MG tablet Take 1 tablet (20 mg total) by mouth daily. 90 tablet 1  . gabapentin (NEURONTIN) 300 MG capsule Take 2 capsules (600 mg total) by mouth 2 (two) times daily. 360 capsule 1  . HYDROcodone-acetaminophen (NORCO) 10-325 MG tablet Take 1-2 tablets by mouth every 8 (eight) hours as needed for moderate pain.     . Insulin Degludec (TRESIBA FLEXTOUCH) 200 UNIT/ML SOPN Inject 16 Units into the skin at bedtime. 12 pen 1  . insulin lispro (HUMALOG KWIKPEN) 100 UNIT/ML KiwkPen Inject 0.05-0.2 mLs (5-20 Units total) into the skin 3 (three) times daily with meals. Give enough for 1 month 12 pen 2  . insulin NPH-regular Human (HUMULIN 70/30) (70-30) 100 UNIT/ML injection Inject 90 Units into the skin 2 (two) times daily with a meal. 60 mL 12  . isosorbide mononitrate (IMDUR) 30 MG 24 hr tablet Take 1 tablet (30 mg total) by mouth daily. 90 tablet 1  . Lactobacillus (PROBIOTIC ACIDOPHILUS PO) Take 1 capsule by mouth daily.    Marland Kitchen linaclotide (LINZESS) 72 MCG capsule Take 1 capsule (72 mcg total) by mouth daily before breakfast. (Patient taking differently: Take 72 mcg by mouth daily as needed. ) 30 capsule 3  . mometasone (NASONEX) 50 MCG/ACT nasal spray Place 2 sprays into the nose as needed.    . nitroGLYCERIN (NITROSTAT) 0.4 MG SL tablet Place 1 tablet (0.4 mg total) under the tongue every 5 (five) minutes x 3 doses as needed for chest pain. 25 tablet 3  . pantoprazole (PROTONIX) 40 MG tablet Take 1 tablet (40 mg total) by mouth daily. 90 tablet 1  . Semaglutide (OZEMPIC)  0.25 or 0.5 MG/DOSE SOPN Inject 0.25 mg into the skin once a week for 28 days, THEN 0.5 mg once a week for 28 days. 12 pen 1  . telmisartan (MICARDIS) 40 MG tablet TAKE ONE TABLET BY MOUTH ONCE DAILY (REPLACING LISINORIL) 90 tablet 1    Allergies:    Allergies  Allergen Reactions  . Shellfish Allergy Anaphylaxis and Other (See Comments)    Tongue swelling, hives Tongue swelling, hives  . Sulfa Antibiotics Anaphylaxis and Rash    Tongue swelling, hives  . Ace Inhibitors Other (See Comments) and Cough    CKD, renal failure   . Invokana [Canagliflozin] Other (See Comments)    Syncope / dehydration  . Metformin And Related Itching  . Pravastatin Sodium Other (See Comments)    myalgias  . Fenofibrate Other (See Comments)    Body aches  . Iodine Other (See Comments)    ????  . Lisinopril Cough  . Livalo [  Pitavastatin] Other (See Comments)    Myalgias   . Repatha [Evolocumab]     Myalgias, flu like sx  . Crestor [Rosuvastatin] Other (See Comments)    Myalgias   . Horse-Derived Products Rash  . Lexapro [Escitalopram Oxalate] Other (See Comments)    Buzzing in ears,headache, felt like a zombie  . Lipitor [Atorvastatin] Other (See Comments)    myalgias  . Tape Rash    Social History:   Social History   Socioeconomic History  . Marital status: Married    Spouse name: Not on file  . Number of children: 2  . Years of education: Not on file  . Highest education level: Not on file  Occupational History  . Occupation: retired  Scientific laboratory technician  . Financial resource strain: Not hard at all  . Food insecurity:    Worry: Patient refused    Inability: Patient refused  . Transportation needs:    Medical: Patient refused    Non-medical: Patient refused  Tobacco Use  . Smoking status: Former Smoker    Packs/day: 0.25    Years: 51.00    Pack years: 12.75    Types: Cigarettes    Last attempt to quit: 08/14/2016    Years since quitting: 1.4  . Smokeless tobacco: Never Used  .  Tobacco comment: smokes  a pack a week  Substance and Sexual Activity  . Alcohol use: No    Alcohol/week: 0.0 standard drinks  . Drug use: No  . Sexual activity: Yes  Lifestyle  . Physical activity:    Days per week: Patient refused    Minutes per session: Patient refused  . Stress: Not at all  Relationships  . Social connections:    Talks on phone: Patient refused    Gets together: Patient refused    Attends religious service: Patient refused    Active member of club or organization: Patient refused    Attends meetings of clubs or organizations: Patient refused    Relationship status: Patient refused  . Intimate partner violence:    Fear of current or ex partner: Patient refused    Emotionally abused: Patient refused    Physically abused: Patient refused    Forced sexual activity: Patient refused  Other Topics Concern  . Not on file  Social History Narrative   Lives with his wife.  Retired.  Unable to afford medicines.      Family History:   The patient's family history includes Alzheimer's disease in his mother; Arthritis in his father and mother; Arthritis/Rheumatoid in his sister; Dementia in his maternal aunt and maternal uncle; Depression in his sister; Diabetes in his father and sister; Heart disease in his father and maternal uncle; Hyperlipidemia in his mother and sister; Hypertension in his mother and sister; Lung cancer in his mother; Stomach cancer in his paternal uncle; Stroke in his father and mother; Valvular heart disease in his father. There is no history of Colon cancer or Liver disease.  ROS:  Please see the history of present illness.  All other ROS reviewed and negative.     Physical Exam/Data:   Vitals:   01/18/18 0300 01/18/18 0400 01/18/18 0500 01/18/18 0535  BP: 123/80 126/65 (!) 99/57 110/66  Pulse: 93 93 87 82  Resp: (!) 23  16 20   Temp:    97.9 F (36.6 C)  TempSrc:    Oral  SpO2: 95% 92% 97% 98%  Weight: 130.2 kg   129.5 kg  Height: 6\' 3"   (1.905  m)   6\' 3"  (1.905 m)   No intake or output data in the 24 hours ending 01/18/18 0943 Filed Weights   01/18/18 0300 01/18/18 0535  Weight: 130.2 kg 129.5 kg   Body mass index is 35.69 kg/m.   Gen: Morbidly obese male, appears comfortable at rest. HEENT: Conjunctiva and lids normal, oropharynx clear with moist mucosa. Neck: Supple, no elevated JVP or carotid bruits, no thyromegaly. Lungs: Clear to auscultation, nonlabored breathing at rest. Cardiac: Regular rate and rhythm, no S3, soft systolic murmur, no pericardial rub. Abdomen: Soft, nontender, bowel sounds present, no guarding or rebound. Extremities: No pitting edema, distal pulses 2+. Skin: Warm and dry. Musculoskeletal: No kyphosis. Neuropsychiatric: Alert and oriented x3, affect grossly appropriate.  EKG:  I personally reviewed the tracing from 01/18/2018 which shows sinus rhythm with nonspecific ST-T changes, primarily in the lateral leads.  Telemetry:  I personally reviewed telemetry which shows sinus rhythm.  Relevant CV Studies:  Cardiac catheterization and PCI 07/30/2016: 1. Mild nonobstructive LAD stenosis with severe branch vessel disease involving the first and second diagonals 2. Total occlusion of the right coronary artery within the previously implanted stent from 2010 with TIMI 2 antegrade flow and brisk collateral filling the PDA from the septal perforators of the LAD 3. Severe complex disease in the left circumflex extending into the first obtuse marginal branch, treated successfully with overlapping drug-eluting stents 4. Normal LV function by echo assessment  Recommendations:  Resume anticoagulation with Eliquis tomorrow for PAF  Plavix 75 mg daily (loaded with 600 mg in cath lab)  No ASA (has had gingival bleeding and don't think he will tolerate triple anticoagulant therapy)  Consider PCI of the RCA if refractory angina. However, with collateral filling of the PDA and need for chronic  anticoagulation, concern over bleeding risk, may be best for medical therapy  Echocardiogram 05/22/2016: Study Conclusions  - Left ventricle: The cavity size was normal. Wall thickness was   increased in a pattern of mild LVH. Systolic function was normal.   The estimated ejection fraction was in the range of 55% to 60%.   Wall motion was normal; there were no regional wall motion   abnormalities. Features are consistent with a pseudonormal left   ventricular filling pattern, with concomitant abnormal relaxation   and increased filling pressure (grade 2 diastolic dysfunction).   Doppler parameters are consistent with high ventricular filling   pressure. - Aortic valve: Mildly calcified annulus. Trileaflet; mildly   thickened leaflets. Valve area (VTI): 2.13 cm^2. Valve area   (Vmax): 2.24 cm^2. Valve area (Vmean): 1.82 cm^2. - Mitral valve: Mildly calcified annulus. Normal thickness leaflets   . There was mild regurgitation. - Left atrium: The atrium was moderately dilated. - Atrial septum: No defect or patent foramen ovale was identified.  Laboratory Data:  Chemistry Recent Labs  Lab 01/18/18 0310  NA 131*  K 4.5  CL 94*  CO2 28  GLUCOSE 353*  BUN 23  CREATININE 1.42*  CALCIUM 9.9  GFRNONAA 50*  GFRAA 58*  ANIONGAP 9    No results for input(s): PROT, ALBUMIN, AST, ALT, ALKPHOS, BILITOT in the last 168 hours. Hematology Recent Labs  Lab 01/18/18 0310  WBC 15.9*  RBC 4.54  HGB 14.0  HCT 41.8  MCV 92.1  MCH 30.8  MCHC 33.5  RDW 12.9  PLT 303   Cardiac EnzymesNo results for input(s): TROPONINI in the last 168 hours.  Recent Labs  Lab 01/18/18 0311  TROPIPOC 0.01  Radiology/Studies:  Dg Chest 2 View  Result Date: 01/18/2018 CLINICAL DATA:  67 year old male with chest pain. EXAM: CHEST - 2 VIEW COMPARISON:  Chest radiograph dated 07/19/2016 FINDINGS: Stable mild cardiomegaly. No focal consolidation, pleural effusion, or pneumothorax. Minimal left lung base  atelectatic changes. Loop recorder device. Partially visualized lower cervical fusion plate. No acute osseous pathology. IMPRESSION: No active cardiopulmonary disease. Electronically Signed   By: Anner Crete M.D.   On: 01/18/2018 03:57   Ct Head Wo Contrast  Result Date: 01/18/2018 CLINICAL DATA:  Syncopal episodes. Headache and chest pain. History of LEFT vertebral artery occlusion, hypertension, diabetes, hyperlipidemia. EXAM: CT HEAD WITHOUT CONTRAST TECHNIQUE: Contiguous axial images were obtained from the base of the skull through the vertex without intravenous contrast. COMPARISON:  MRI head May 22, 2016 FINDINGS: BRAIN: No intraparenchymal hemorrhage, mass effect nor midline shift. The ventricles and sulci are normal for age. Minimal supratentorial white matter hypodensities within normal range for patient's age, though non-specific are most compatible with chronic small vessel ischemic disease. No acute large vascular territory infarcts. Old LEFT basal ganglia lacunar infarct. No abnormal extra-axial fluid collections. Basal cisterns are patent. VASCULAR: Moderate calcific atherosclerosis of the carotid siphons. SKULL: No skull fracture. Old nondisplaced LEFT and possibly RIGHT nasal bone fractures. No significant scalp soft tissue swelling. SINUSES/ORBITS: Trace paranasal sinus mucosal thickening. Mastoid air cells are well aerated.The included ocular globes and orbital contents are non-suspicious. OTHER: None. IMPRESSION: 1. No acute intracranial process. 2. Old LEFT basal ganglia lacunar infarct, otherwise negative non-contrast CT HEAD for age. Electronically Signed   By: Elon Alas M.D.   On: 01/18/2018 04:02    Assessment and Plan:   1.  Chest tightness consistent with unstable angina.  ECG shows nonspecific ST-T wave changes in the lateral leads, initial troponin I negative.  Patient reports compliance with outpatient cardiac regimen.  2.  CAD with history of DES to the RCA in  2010, subsequently found to be occluded at angiography in March 2018 with associated collaterals, also severe disease in the circumflex/OM system requiring DES x2.  LVEF normal range by echocardiogram last year.  He has been on Plavix since last year's intervention.  3.  Paroxysmal atrial fibrillation with history of previous stroke.  He is on Eliquis along with his Plavix.  He has an ILR in place and is followed by Dr. Lovena Le, no recent atrial fibrillation.  4.  Reported shellfish and iodine allergy.  Based on chart review (see H&P from Dr. Gwenlyn Found March 2018), the patient has received intravenous contrast without any types of reaction, did not get pretreated with cardiac catheterization last year.  5.  Mixed hyperlipidemia with statin intolerance and also intolerance of Repatha.  He is on fenofibrate.  LDL was 157 in July of last year.  Has been enrolled in the [employer] Study.  6.  Type 2 diabetes mellitus, most recent hemoglobin A1c 8.1.  7. CKD, stage 3 with current creatinine 1.42.  Reviewed history and discussed the case with the patient and his wife.  In light of unstable angina symptoms plan is to have him transferred to [facility] telemetry in anticipation of a diagnostic cardiac catheterization tomorrow. Eliquis will be held today.  Would also hold ARB in light of current creatinine with CKD stage 3 and plan for angiography.  Otherwise continue baseline cardiac regimen.  Continue to cycle cardiac enzymes, follow-up CBC and to BMET in a.m.  Signed, Rozann Lesches, MD  01/18/2018 9:43 AM

## 2018-01-18 NOTE — ED Provider Notes (Signed)
[facility] EMERGENCY DEPARTMENT Provider Note   CSN: [id number] Arrival date & time: 01/18/18  4166     History   Chief Complaint Chief Complaint  Patient presents with  . Chest Pain    HPI [name] [name] is a 67 y.o. male.  Patient with history of CAD with stents, diabetes, hypertension presenting with chest pain onset yesterday afternoon while at rest around 4 PM.  He took one nitroglycerin after about 1 hour and the pain went away.  He then went to sleep and the pain was there again at 6:45 PM he took another nitroglycerin.  He is now chest pain-free.  EMS was called and told his EKG looked normal and encouraged that he come to the hospital.  Patient is chest pain-free now.  It radiated to his left arm and neck.  Associate some shortness of breath.  No nausea or vomiting or diaphoresis.  No abdominal pain.  Now has a headache after using nitroglycerin.  He does take Eliquis for history of stroke as well as Plavix.  Does not take aspirin on a regular basis but did take some at home tonight.  The history is provided by the patient and the spouse.  Chest Pain   Associated symptoms include headaches and shortness of breath. Pertinent negatives include no abdominal pain, no dizziness, no fever, no nausea and no vomiting.    Past Medical History:  Diagnosis Date  . Anxiety   . AODM   . Arthritis   . CAD (coronary artery disease)    a. 2010: DES to CTO of RCA. EF 55% c. 07/2016: cath showing total occlusion within previously placed RCA stent (collaterals present), severe stenosis along LCx and OM1 (treated with 2 overlapping DES).   . Cellulitis and abscess rt groin   . CHEST PAIN-UNSPECIFIED   . Depression   . Diabetes mellitus   . Disorders of iron metabolism   . DISORDERS OF IRON METABOLISM   . GERD (gastroesophageal reflux disease)   . Hyperlipidemia   . Hypertension   . Low serum testosterone level   . Medically noncompliant   . MURMUR   . Myocardial infarction [facility])      Patient Active Problem List   Diagnosis Date Noted  . CHF (congestive heart failure) ([place]) 10/21/2017  . Anxiety 03/09/2017  . Constipation 11/26/2016  . History of non-ST elevation myocardial infarction (NSTEMI) 07/29/2016  . Diabetes ([place]) 05/25/2016  . Occlusion and stenosis of vertebral artery   . Acute renal failure ([place]) 05/23/2016  . Syncope 05/21/2016  . Diabetic neuropathy ([place]) 01/13/2016  . Depression 11/01/2015  . Erectile dysfunction 01/23/2015  . Morbid obesity ([place]) 01/23/2015  . Periodontal disease 01/23/2015  . Stopped smoking with greater than 30 pack year history 01/23/2015  . Diabetic peripheral neuropathy associated with type 2 diabetes mellitus ([place]) 11/08/2014  . Mucosal abnormality of stomach   . History of colonic polyps   . GERD (gastroesophageal reflux disease) 09/06/2014  . Low serum testosterone level 08/01/2014  . DDD (degenerative disc disease), cervical 12/20/2013  . Headache 08/07/2013  . Medically noncompliant 10/25/2011  . CAD S/P percutaneous coronary angioplasty 10/25/2011  . MURMUR 04/16/2009  . Hypertension associated with diabetes ([place]) 08/02/2008  . Hyperlipidemia associated with type 2 diabetes mellitus ([place]) 07/31/2008  . DISORDERS OF IRON METABOLISM 07/31/2008    Past Surgical History:  Procedure Laterality Date  . BACK SURGERY  2015   ACDF by Dr. [name]  . COLONOSCOPY N/A 10/01/2014  Procedure: COLONOSCOPY;  Surgeon: Daneil Dolin, MD;  Location[facility];  Service: Endoscopy;  Laterality: N/A;  815  . CORONARY STENT INTERVENTION N/A 07/30/2016   Procedure: Coronary Stent Intervention;  Surgeon: [name], MD;  Location: [place] CV LAB;  Service: Cardiovascular;  Laterality: N/A;  . CORONARY STENT PLACEMENT  2000   By Dr. Olevia Perches  . EP IMPLANTABLE DEVICE N/A 05/25/2016   Procedure: Loop Recorder Insertion;  Surgeon: [name], MD;  Location: [place] CV LAB;  Service: Cardiovascular;  Laterality: N/A;  .  ESOPHAGOGASTRODUODENOSCOPY     esophagus stretched [facility] at [facility]  . ESOPHAGOGASTRODUODENOSCOPY N/A 10/01/2014   Procedure: ESOPHAGOGASTRODUODENOSCOPY (EGD);  Surgeon: Daneil Dolin, MD;  Location: [facility] ENDO [facility];  Service: Endoscopy;  Laterality: N/A;  . HERNIA REPAIR  4782   umbilical  . LEFT HEART CATH AND CORONARY ANGIOGRAPHY N/A 07/30/2016   Procedure: Left Heart Cath and Coronary Angiography;  Surgeon: [name], MD;  Location: [place] CV LAB;  Service: Cardiovascular;  Laterality: N/A;  . LESION REMOVAL     Lip and hand   . NECK SURGERY          Home Medications    Prior to Admission medications   Medication Sig Start Date End Date Taking? Authorizing Provider  clopidogrel (PLAVIX) 75 MG tablet TAKE 1 TABLET BY MOUTH ONCE DAILY WITH BREAKFAST 12/10/17   [name] A, FNP  cyclobenzaprine (FLEXERIL) 10 MG tablet Take 10 mg by mouth. 07/09/17   [provider]  DULoxetine (CYMBALTA) 30 MG capsule Take 1 capsule (30 mg total) by mouth daily. 12/10/17   [name], FNP  ELIQUIS 5 MG TABS tablet TAKE 1 TABLET BY MOUTH TWICE DAILY 08/30/17   Arnoldo Lenis, MD  fenofibrate 160 MG tablet TAKE 1 TABLET BY MOUTH ONCE DAILY FOR CHOLESTEROL AND  TRIGLYCERIDE 12/10/17   [name] A, FNP  furosemide (LASIX) 20 MG tablet Take 1 tablet (20 mg total) by mouth daily. 10/21/17   [name], FNP  gabapentin (NEURONTIN) 300 MG capsule Take 2 capsules (600 mg total) by mouth 2 (two) times daily. 10/25/17   [name], FNP  HYDROcodone-acetaminophen (NORCO) 10-325 MG tablet Take 1-2 tablets by mouth every 8 (eight) hours as needed for moderate pain.  01/10/16   [provider]  Insulin Degludec (TRESIBA FLEXTOUCH) 200 UNIT/ML SOPN Inject 16 Units into the skin at bedtime. 12/14/17   [name] A, FNP  insulin lispro (HUMALOG KWIKPEN) 100 UNIT/ML KiwkPen Inject 0.05-0.2 mLs (5-20 Units total) into the skin 3 (three) times daily with meals. Give  enough for 1 month 11/26/17   Dettinger, Fransisca Kaufmann, MD  insulin NPH-regular Human (HUMULIN 70/30) (70-30) 100 UNIT/ML injection Inject 90 Units into the skin 2 (two) times daily with a meal. 11/09/17   Hawks, Alyse Low A, FNP  isosorbide mononitrate (IMDUR) 30 MG 24 hr tablet Take 1 tablet (30 mg total) by mouth daily. 12/10/17   [name] A, FNP  Lactobacillus (PROBIOTIC ACIDOPHILUS PO) Take 1 capsule by mouth daily.    [provider]  linaclotide (LINZESS) 72 MCG capsule Take 1 capsule (72 mcg total) by mouth daily before breakfast. Patient taking differently: Take 72 mcg by mouth daily as needed.  08/26/16   Hawks, Alyse Low A, FNP  mometasone (NASONEX) 50 MCG/ACT nasal spray Place 2 sprays into the nose as needed.    [provider]  nitroGLYCERIN (NITROSTAT) 0.4 MG SL tablet Place 1  tablet (0.4 mg total) under the tongue every 5 (five) minutes x 3 doses as needed for chest pain. 09/06/17   Strader, Fransisco Hertz, PA-C  pantoprazole (PROTONIX) 40 MG tablet Take 1 tablet (40 mg total) by mouth daily. 12/10/17   Hawks, Christy A, FNP  Semaglutide (OZEMPIC) 0.25 or 0.5 MG/DOSE SOPN Inject 0.25 mg into the skin once a week for 28 days, THEN 0.5 mg once a week for 28 days. 12/10/17 02/04/18  [name] A, FNP  telmisartan (MICARDIS) 40 MG tablet TAKE ONE TABLET BY MOUTH ONCE DAILY (REPLACING LISINORIL) 09/21/17   [name], FNP    Family History Family History  Problem Relation Age of Onset  . Diabetes Father   . Valvular heart disease Father   . Arthritis Father   . Heart disease Father   . Stroke Father   . Alzheimer's disease Mother   . Hyperlipidemia Mother   . Hypertension Mother   . Arthritis Mother   . Lung cancer Mother   . Stroke Mother   . Arthritis/Rheumatoid Sister   . Diabetes Sister   . Hypertension Sister   . Hyperlipidemia Sister   . Depression Sister   . Dementia Maternal Aunt   . Dementia Maternal Uncle   . Heart disease Maternal Uncle   . Stomach  cancer Paternal Uncle   . Colon cancer Neg Hx   . Liver disease Neg Hx     Social History Social History   Tobacco Use  . Smoking status: Former Smoker    Packs/day: 0.25    Years: 51.00    Pack years: 12.75    Types: Cigarettes    Last attempt to quit: 08/14/2016    Years since quitting: 1.4  . Smokeless tobacco: Never Used  . Tobacco comment: smokes  a pack a week  Substance Use Topics  . Alcohol use: No    Alcohol/week: 0.0 standard drinks  . Drug use: No     Allergies   Shellfish allergy; Sulfa antibiotics; Ace inhibitors; Invokana [canagliflozin]; Metformin and related; Pravastatin sodium; Fenofibrate; Iodine; Lisinopril; Livalo [pitavastatin]; Repatha [evolocumab]; Crestor [rosuvastatin]; Horse-derived products; Lexapro [escitalopram oxalate]; Lipitor [atorvastatin]; and Tape   Review of Systems Review of Systems  Constitutional: Negative for activity change, appetite change and fever.  HENT: Negative for congestion.   Eyes: Negative for itching.  Respiratory: Positive for chest tightness and shortness of breath.   Cardiovascular: Positive for chest pain.  Gastrointestinal: Negative for abdominal pain, nausea and vomiting.  Genitourinary: Negative for dysuria, hematuria and testicular pain.  Musculoskeletal: Negative for arthralgias and myalgias.  Neurological: Positive for headaches. Negative for dizziness and light-headedness.   all other systems are negative except as noted in the HPI and PMH.     Physical Exam Updated Vital Signs Pulse 91   Temp 98.7 F (37.1 C) (Oral)   Resp (!) 21   Ht 6\' 3"  (1.905 m)   Wt 130.2 kg   SpO2 95%   BMI 35.87 kg/m   Physical Exam  Constitutional: He is oriented to person, place, and time. He appears well-developed and well-nourished. No distress.  HENT:  Head: Normocephalic and atraumatic.  Mouth/Throat: Oropharynx is clear and moist. No oropharyngeal exudate.  Eyes: Pupils are equal, round, and reactive to light.  Conjunctivae and EOM are normal.  Neck: Normal range of motion. Neck supple.  No meningismus.  Cardiovascular: Normal rate, regular rhythm, normal heart sounds and intact distal pulses.  No murmur heard. Pulmonary/Chest: Effort normal and  breath sounds normal. No respiratory distress. He exhibits no tenderness.  Abdominal: Soft. There is no tenderness. There is no rebound and no guarding.  Musculoskeletal: Normal range of motion. He exhibits no edema or tenderness.  Neurological: He is alert and oriented to person, place, and time. No cranial nerve deficit. He exhibits normal muscle tone. Coordination normal.   5/5 strength throughout. CN 2-12 intact.Equal grip strength.   Skin: Skin is warm.  Psychiatric: He has a normal mood and affect. His behavior is normal.  Nursing note and vitals reviewed.    ED Treatments / Results  Labs (all labs ordered are listed, but only abnormal results are displayed) Labs Reviewed  BASIC METABOLIC PANEL - Abnormal; Notable for the following components:      Result Value   Sodium 131 (*)    Chloride 94 (*)    Glucose, Bld 353 (*)    Creatinine, Ser 1.42 (*)    GFR calc non Af Amer 50 (*)    GFR calc Af Amer 58 (*)    All other components within normal limits  CBC - Abnormal; Notable for the following components:   WBC 15.9 (*)    All other components within normal limits  MAGNESIUM  HIV ANTIBODY (ROUTINE TESTING)  TROPONIN I  TROPONIN I  I-STAT TROPONIN, ED    EKG EKG Interpretation  Date/Time:  Tuesday January 18 2018 02:51:25 EDT Ventricular Rate:  92 PR Interval:    QRS Duration: 106 QT Interval:  341 QTC Calculation: 422 R Axis:   85 Text Interpretation:  Sinus rhythm Borderline right axis deviation Nonspecific T abnormalities, lateral leads Baseline wander in lead(s) V6 No significant change was found Confirmed by Ezequiel Essex [phone]) on 01/18/2018 3:19:18 AM   Radiology Dg Chest 2 View  Result Date: 01/18/2018 CLINICAL  DATA:  67 year old male with chest pain. EXAM: CHEST - 2 VIEW COMPARISON:  Chest radiograph dated 07/19/2016 FINDINGS: Stable mild cardiomegaly. No focal consolidation, pleural effusion, or pneumothorax. Minimal left lung base atelectatic changes. Loop recorder device. Partially visualized lower cervical fusion plate. No acute osseous pathology. IMPRESSION: No active cardiopulmonary disease. Electronically Signed   By: Anner Crete M.D.   On: 01/18/2018 03:57   Ct Head Wo Contrast  Result Date: 01/18/2018 CLINICAL DATA:  Syncopal episodes. Headache and chest pain. History of LEFT vertebral artery occlusion, hypertension, diabetes, hyperlipidemia. EXAM: CT HEAD WITHOUT CONTRAST TECHNIQUE: Contiguous axial images were obtained from the base of the skull through the vertex without intravenous contrast. COMPARISON:  MRI head May 22, 2016 FINDINGS: BRAIN: No intraparenchymal hemorrhage, mass effect nor midline shift. The ventricles and sulci are normal for age. Minimal supratentorial white matter hypodensities within normal range for patient's age, though non-specific are most compatible with chronic small vessel ischemic disease. No acute large vascular territory infarcts. Old LEFT basal ganglia lacunar infarct. No abnormal extra-axial fluid collections. Basal cisterns are patent. VASCULAR: Moderate calcific atherosclerosis of the carotid siphons. SKULL: No skull fracture. Old nondisplaced LEFT and possibly RIGHT nasal bone fractures. No significant scalp soft tissue swelling. SINUSES/ORBITS: Trace paranasal sinus mucosal thickening. Mastoid air cells are well aerated.The included ocular globes and orbital contents are non-suspicious. OTHER: None. IMPRESSION: 1. No acute intracranial process. 2. Old LEFT basal ganglia lacunar infarct, otherwise negative non-contrast CT HEAD for age. Electronically Signed   By: Elon Alas M.D.   On: 01/18/2018 04:02    Procedures Procedures (including critical care  time)  Medications Ordered in ED Medications  morphine 4  MG/ML injection 4 mg (has no administration in time range)  ondansetron (ZOFRAN) injection 4 mg (has no administration in time range)     Initial Impression / Assessment and Plan / ED Course  I have reviewed the triage vital signs and the nursing notes.  Pertinent labs & imaging results that were available during my care of the patient were reviewed by me and considered in my medical decision making (see chart for details).    Episode of chest pain, now resolved after nitroglycerin.  EKG without acute ischemic change.  Now has headache after receiving nitroglycerin.  Cath 2018 Mild nonobstructive LAD stenosis with severe branch vessel disease involving the first and second diagonals 2. Total occlusion of the right coronary artery within the previously implanted stent from 2010 with TIMI 2 antegrade flow and brisk collateral filling the PDA from the septal perforators of the LAD 3. Severe complex disease in the left circumflex extending into the first obtuse marginal branch, treated successfully with overlapping drug-eluting stents 4. Normal LV function by echo assessment  Troponin negative. Remains chest pain free. Received ASA and NTG.  Plan observation admission and rule out given elevated HEART score.  Doubt aortic dissection or pulmonary embolism.. D/w Dr. Olevia Bowens.  Final Clinical Impressions(s) / ED Diagnoses   Final diagnoses:  None    ED Discharge Orders    None       Dontavian Marchi, Annie Main, MD 01/18/18 [phone]

## 2018-01-19 ENCOUNTER — Encounter: Payer: Self-pay | Admitting: Cardiology

## 2018-01-19 ENCOUNTER — Ambulatory Visit (HOSPITAL_BASED_OUTPATIENT_CLINIC_OR_DEPARTMENT_OTHER): Payer: Medicare Other

## 2018-01-19 ENCOUNTER — Encounter (HOSPITAL_COMMUNITY)
Admission: EM | Disposition: A | Discharge status: Home / Self Care | Payer: Self-pay | Source: Home / Self Care | Attending: Emergency Medicine

## 2018-01-19 DIAGNOSIS — I25118 Atherosclerotic heart disease of native coronary artery with other forms of angina pectoris: Secondary | ICD-10-CM

## 2018-01-19 DIAGNOSIS — I25110 Atherosclerotic heart disease of native coronary artery with unstable angina pectoris: Secondary | ICD-10-CM

## 2018-01-19 DIAGNOSIS — I200 Unstable angina: Secondary | ICD-10-CM | POA: Diagnosis not present

## 2018-01-19 DIAGNOSIS — R079 Chest pain, unspecified: Secondary | ICD-10-CM | POA: Diagnosis not present

## 2018-01-19 HISTORY — PX: LEFT HEART CATH AND CORONARY ANGIOGRAPHY: CATH118249

## 2018-01-19 LAB — ECHOCARDIOGRAM COMPLETE
Height: 75 in
Weight: 4600 oz

## 2018-01-19 LAB — GLUCOSE, CAPILLARY
Glucose-Capillary: 186 mg/dL — ABNORMAL HIGH (ref 70–99)
Glucose-Capillary: 207 mg/dL — ABNORMAL HIGH (ref 70–99)
Glucose-Capillary: 309 mg/dL — ABNORMAL HIGH (ref 70–99)
Glucose-Capillary: 340 mg/dL — ABNORMAL HIGH (ref 70–99)
Glucose-Capillary: 343 mg/dL — ABNORMAL HIGH (ref 70–99)

## 2018-01-19 LAB — HEMOGLOBIN A1C
Hgb A1c MFr Bld: 8.9 % — ABNORMAL HIGH (ref 4.8–5.6)
Mean Plasma Glucose: 208.73 mg/dL

## 2018-01-19 LAB — BASIC METABOLIC PANEL
Anion gap: 11 (ref 5–15)
BUN: 29 mg/dL — ABNORMAL HIGH (ref 8–23)
CO2: 25 mmol/L (ref 22–32)
Calcium: 9.8 mg/dL (ref 8.9–10.3)
Chloride: 94 mmol/L — ABNORMAL LOW (ref 98–111)
Creatinine, Ser: 1.47 mg/dL — ABNORMAL HIGH (ref 0.61–1.24)
GFR calc Af Amer: 55 mL/min — ABNORMAL LOW (ref >60)
GFR calc non Af Amer: 48 mL/min — ABNORMAL LOW (ref >60)
Glucose, Bld: 330 mg/dL — ABNORMAL HIGH (ref 70–99)
Potassium: 4.9 mmol/L (ref 3.5–5.1)
Sodium: 130 mmol/L — ABNORMAL LOW (ref 135–145)

## 2018-01-19 LAB — SODIUM, URINE, RANDOM: Sodium, Ur: 107 mmol/L

## 2018-01-19 LAB — TSH: TSH: 3.093 u[IU]/mL (ref 0.350–4.500)

## 2018-01-19 LAB — HIV ANTIBODY (ROUTINE TESTING W REFLEX): HIV Screen 4th Generation wRfx: NONREACTIVE

## 2018-01-19 LAB — OSMOLALITY, URINE: Osmolality, Ur: 527 mOsm/kg (ref 300–900)

## 2018-01-19 LAB — CORTISOL: Cortisol, Plasma: 8.4 ug/dL

## 2018-01-19 LAB — OSMOLALITY: Osmolality: 294 mOsm/kg (ref 275–295)

## 2018-01-19 SURGERY — LEFT HEART CATH AND CORONARY ANGIOGRAPHY
Anesthesia: LOCAL

## 2018-01-19 MED ORDER — FENTANYL CITRATE (PF) 100 MCG/2ML IJ SOLN
INTRAMUSCULAR | Status: DC | PRN
  Administered 2018-01-19: 50 ug via INTRAVENOUS

## 2018-01-19 MED ORDER — SODIUM CHLORIDE 0.9% FLUSH
3.0000 mL | Freq: Two times a day (BID) | INTRAVENOUS | Status: DC
  Administered 2018-01-19 – 2018-01-20 (×2): 3 mL via INTRAVENOUS

## 2018-01-19 MED ORDER — INSULIN ASPART 100 UNIT/ML ~~LOC~~ SOLN
10.0000 [IU] | Freq: Three times a day (TID) | SUBCUTANEOUS | Status: DC
  Administered 2018-01-20: 10 [IU] via SUBCUTANEOUS

## 2018-01-19 MED ORDER — HEPARIN (PORCINE) IN NACL 1000-0.9 UT/500ML-% IV SOLN
INTRAVENOUS | Status: DC | PRN
  Administered 2018-01-19 (×2): 500 mL

## 2018-01-19 MED ORDER — VERAPAMIL HCL 2.5 MG/ML IV SOLN
INTRAVENOUS | Status: DC | PRN
  Administered 2018-01-19: 10 mL via INTRA_ARTERIAL

## 2018-01-19 MED ORDER — HEPARIN SODIUM (PORCINE) 1000 UNIT/ML IJ SOLN
INTRAMUSCULAR | Status: DC | PRN
  Administered 2018-01-19: 6500 [IU] via INTRAVENOUS

## 2018-01-19 MED ORDER — VERAPAMIL HCL 2.5 MG/ML IV SOLN
INTRAVENOUS | Status: AC
  Filled 2018-01-19: qty 2

## 2018-01-19 MED ORDER — HEPARIN SODIUM (PORCINE) 1000 UNIT/ML IJ SOLN
INTRAMUSCULAR | Status: AC
  Filled 2018-01-19: qty 1

## 2018-01-19 MED ORDER — FENTANYL CITRATE (PF) 100 MCG/2ML IJ SOLN
INTRAMUSCULAR | Status: AC
  Filled 2018-01-19: qty 2

## 2018-01-19 MED ORDER — PERFLUTREN LIPID MICROSPHERE
1.0000 mL | INTRAVENOUS | Status: AC | PRN
  Administered 2018-01-19: 2 mL via INTRAVENOUS
  Filled 2018-01-19: qty 10

## 2018-01-19 MED ORDER — MIDAZOLAM HCL 2 MG/2ML IJ SOLN
INTRAMUSCULAR | Status: AC
  Filled 2018-01-19: qty 2

## 2018-01-19 MED ORDER — ISOSORBIDE MONONITRATE ER 60 MG PO TB24
60.0000 mg | ORAL_TABLET | Freq: Every day | ORAL | Status: DC
  Administered 2018-01-20: 60 mg via ORAL
  Filled 2018-01-19: qty 1

## 2018-01-19 MED ORDER — ACETAMINOPHEN 325 MG PO TABS: 650.0000 mg | ORAL_TABLET | ORAL | Status: DC | PRN

## 2018-01-19 MED ORDER — HEPARIN (PORCINE) IN NACL 1000-0.9 UT/500ML-% IV SOLN
INTRAVENOUS | Status: AC
  Filled 2018-01-19: qty 1000

## 2018-01-19 MED ORDER — DIAZEPAM 5 MG PO TABS: 5.0000 mg | ORAL_TABLET | Freq: Four times a day (QID) | ORAL | Status: DC | PRN

## 2018-01-19 MED ORDER — MIDAZOLAM HCL 2 MG/2ML IJ SOLN
INTRAMUSCULAR | Status: DC | PRN
  Administered 2018-01-19 (×2): 1 mg via INTRAVENOUS

## 2018-01-19 MED ORDER — LIDOCAINE HCL (PF) 1 % IJ SOLN
INTRAMUSCULAR | Status: AC
  Filled 2018-01-19: qty 30

## 2018-01-19 MED ORDER — ONDANSETRON HCL 4 MG/2ML IJ SOLN: 4.0000 mg | Freq: Four times a day (QID) | INTRAMUSCULAR | Status: DC | PRN

## 2018-01-19 MED ORDER — METOPROLOL TARTRATE 12.5 MG HALF TABLET
12.5000 mg | ORAL_TABLET | Freq: Two times a day (BID) | ORAL | Status: DC
  Administered 2018-01-19 – 2018-01-20 (×2): 12.5 mg via ORAL
  Filled 2018-01-19 (×2): qty 1

## 2018-01-19 MED ORDER — CLOPIDOGREL BISULFATE 75 MG PO TABS: 75.0000 mg | ORAL_TABLET | Freq: Every day | ORAL | Status: DC

## 2018-01-19 MED ORDER — SODIUM CHLORIDE 0.9 % IV SOLN: 250.0000 mL | INTRAVENOUS | Status: DC | PRN

## 2018-01-19 MED ORDER — SODIUM CHLORIDE 0.9% FLUSH: 3.0000 mL | INTRAVENOUS | Status: DC | PRN

## 2018-01-19 MED ORDER — IOPAMIDOL (ISOVUE-370) INJECTION 76%
INTRAVENOUS | Status: AC
  Filled 2018-01-19: qty 100

## 2018-01-19 MED ORDER — LIDOCAINE HCL (PF) 1 % IJ SOLN
INTRAMUSCULAR | Status: DC | PRN
  Administered 2018-01-19: 2 mL via INTRADERMAL

## 2018-01-19 MED ORDER — IOPAMIDOL (ISOVUE-370) INJECTION 76%
INTRAVENOUS | Status: DC | PRN
  Administered 2018-01-19: 65 mL via INTRA_ARTERIAL

## 2018-01-19 MED ORDER — INSULIN DETEMIR 100 UNIT/ML ~~LOC~~ SOLN
50.0000 [IU] | Freq: Two times a day (BID) | SUBCUTANEOUS | Status: DC
  Administered 2018-01-19 – 2018-01-20 (×2): 50 [IU] via SUBCUTANEOUS
  Filled 2018-01-19 (×2): qty 0.5

## 2018-01-19 MED ORDER — SODIUM CHLORIDE 0.9 % IV SOLN
INTRAVENOUS | Status: DC
  Administered 2018-01-20: 01:00:00 via INTRAVENOUS

## 2018-01-19 SURGICAL SUPPLY — 10 items
CATH OPTITORQUE TIG 4.0 5F (CATHETERS) ×1 IMPLANT
DEVICE RAD COMP TR BAND LRG (VASCULAR PRODUCTS) ×1 IMPLANT
ELECT DEFIB PAD ADLT CADENCE (PAD) ×1 IMPLANT
GLIDESHEATH SLEND SS 6F .021 (SHEATH) ×1 IMPLANT
GUIDEWIRE INQWIRE 1.5J.035X260 (WIRE) IMPLANT
INQWIRE 1.5J .035X260CM (WIRE) ×2
KIT HEART LEFT (KITS) ×2 IMPLANT
PACK CARDIAC CATHETERIZATION (CUSTOM PROCEDURE TRAY) ×2 IMPLANT
TRANSDUCER W/STOPCOCK (MISCELLANEOUS) ×2 IMPLANT
TUBING CIL FLEX 10 FLL-RA (TUBING) ×2 IMPLANT

## 2018-01-19 NOTE — Progress Notes (Signed)
Progress Note  Patient Name: [name] [name] Date of Encounter: 01/19/2018  Primary Cardiologist: Carlyle Dolly, MD   Subjective   No chest pain or dyspnea  Inpatient Medications    Scheduled Meds: . clopidogrel  75 mg Oral Daily  . DULoxetine  30 mg Oral QHS  . fenofibrate  160 mg Oral Daily  . gabapentin  600 mg Oral BID  . insulin aspart  0-20 Units Subcutaneous TID WC  . insulin aspart  0-5 Units Subcutaneous QHS  . insulin aspart  5 Units Subcutaneous TID WC  . insulin NPH Human  35 Units Subcutaneous BID AC & HS  . isosorbide mononitrate  30 mg Oral Daily  . pantoprazole  40 mg Oral Daily  . sodium chloride flush  3 mL Intravenous Q12H   Continuous Infusions: . sodium chloride    . sodium chloride 1 mL/kg/hr (01/19/18 0515)   PRN Meds: sodium chloride, acetaminophen, alum & mag hydroxide-simeth, HYDROcodone-acetaminophen, linaclotide, nitroGLYCERIN, ondansetron (ZOFRAN) IV, ondansetron **OR** ondansetron (ZOFRAN) IV, sodium chloride flush   Vital Signs    Vitals:   01/18/18 1644 01/18/18 2110 01/18/18 2130 01/19/18 0429  BP: 123/71 126/63  118/78  Pulse: 74 85  70  Resp: 20 20  20   Temp: 98.7 F (37.1 C) 98.2 F (36.8 C)  98.3 F (36.8 C)  TempSrc:  Oral    SpO2: 95% 98%  95%  Weight:   130.4 kg   Height:   6\' 3"  (1.905 m)     Intake/Output Summary (Last 24 hours) at 01/19/2018 0815 Last data filed at 01/18/2018 2130 Gross per 24 hour  Intake 360 ml  Output -  Net 360 ml   Filed Weights   01/18/18 0300 01/18/18 0535 01/18/18 2130  Weight: 130.2 kg 129.5 kg 130.4 kg    Telemetry    Sinus with pvcs- Personally Reviewed  Physical Exam   GEN: No acute distress.   Neck: No JVD Cardiac: RRR, no murmurs, rubs, or gallops.  Respiratory: Clear to auscultation bilaterally. GI: Soft, nontender, non-distended  MS: No edema Neuro:  Nonfocal  Psych: Normal affect   Labs    Chemistry Recent Labs  Lab 01/18/18 0310  NA 131*  K 4.5  CL 94*  CO2  28  GLUCOSE 353*  BUN 23  CREATININE 1.42*  CALCIUM 9.9  GFRNONAA 50*  GFRAA 58*  ANIONGAP 9     Hematology Recent Labs  Lab 01/18/18 0310  WBC 15.9*  RBC 4.54  HGB 14.0  HCT 41.8  MCV 92.1  MCH 30.8  MCHC 33.5  RDW 12.9  PLT 303    Cardiac Enzymes Recent Labs  Lab 01/18/18 0908 01/18/18 1537  TROPONINI 0.03* 0.03*    Recent Labs  Lab 01/18/18 0311  TROPIPOC 0.01     Radiology    Dg Chest 2 View  Result Date: 01/18/2018 CLINICAL DATA:  67 year old male with chest pain. EXAM: CHEST - 2 VIEW COMPARISON:  Chest radiograph dated 07/19/2016 FINDINGS: Stable mild cardiomegaly. No focal consolidation, pleural effusion, or pneumothorax. Minimal left lung base atelectatic changes. Loop recorder device. Partially visualized lower cervical fusion plate. No acute osseous pathology. IMPRESSION: No active cardiopulmonary disease. Electronically Signed   By: Anner Crete M.D.   On: 01/18/2018 03:57   Ct Head Wo Contrast  Result Date: 01/18/2018 CLINICAL DATA:  Syncopal episodes. Headache and chest pain. History of LEFT vertebral artery occlusion, hypertension, diabetes, hyperlipidemia. EXAM: CT HEAD WITHOUT CONTRAST TECHNIQUE: Contiguous axial images were  obtained from the base of the skull through the vertex without intravenous contrast. COMPARISON:  MRI head May 22, 2016 FINDINGS: BRAIN: No intraparenchymal hemorrhage, mass effect nor midline shift. The ventricles and sulci are normal for age. Minimal supratentorial white matter hypodensities within normal range for patient's age, though non-specific are most compatible with chronic small vessel ischemic disease. No acute large vascular territory infarcts. Old LEFT basal ganglia lacunar infarct. No abnormal extra-axial fluid collections. Basal cisterns are patent. VASCULAR: Moderate calcific atherosclerosis of the carotid siphons. SKULL: No skull fracture. Old nondisplaced LEFT and possibly RIGHT nasal bone fractures. No  significant scalp soft tissue swelling. SINUSES/ORBITS: Trace paranasal sinus mucosal thickening. Mastoid air cells are well aerated.The included ocular globes and orbital contents are non-suspicious. OTHER: None. IMPRESSION: 1. No acute intracranial process. 2. Old LEFT basal ganglia lacunar infarct, otherwise negative non-contrast CT HEAD for age. Electronically Signed   By: Elon Alas M.D.   On: 01/18/2018 04:02    Patient Profile     67 y.o. male with past medical history of coronary artery disease, diabetes mellitus, hyperlipidemia, paroxysmal atrial fibrillation, hypertension with unstable angina.  Assessment & Plan    1 unstable angina-patient denies chest pain this morning.  Plan is for cardiac catheterization as outlined by Dr. Domenic Polite.  Continue aspirin and Plavix.  Intolerant to statins.  Schedule echocardiogram to assess LV function.  2 paroxysmal atrial fibrillation-patient remains in sinus rhythm today.  Apixaban is on hold for cardiac catheterization.  Resume after all procedures complete.  3 chronic stage III kidney disease-follow renal function closely after procedure.  Diuretics and ARB on hold.  Limit dye.  No ventriculogram.  Hydrate prior to procedure.  4 hyperlipidemia-history of statin intolerance as well as Repatha intolerance.  He is in the clear research study.  For questions or updates, please contact [place] Please consult [email or web address] for contact info under Cardiology/STEMI.      Signed, Kirk Ruths, MD  01/19/2018, 8:15 AM

## 2018-01-19 NOTE — Progress Notes (Addendum)
Inpatient Diabetes Program Recommendations  AACE/ADA: New Consensus Statement on Inpatient Glycemic Control (2015)  Target Ranges:  Prepandial:   less than 140 mg/dL      Peak postprandial:   less than 180 mg/dL (1-2 hours)      Critically ill patients:  140 - 180 mg/dL   Lab Results  Component Value Date   GLUCAP 343 (H) 01/19/2018   HGBA1C 10.0 (H) 05/21/2016    Review of Glycemic Control Results for [name] [name], [name] [name] (MRN [id number]) as of 01/19/2018 12:04  Ref. Range 01/18/2018 21:06 01/19/2018 00:24 01/19/2018 08:15 01/19/2018 12:02  Glucose-Capillary Latest Ref Range: 70 - 99 mg/dL 300 (H) 340 (H) 309 (H) 343 (H)   Diabetes history: DM2 Outpatient Diabetes medications: Tresiba 16 units + Humulin 70/30 insulin 90 units bid + Humalog correction scale 5-20 units tid Current orders for Inpatient glycemic control: NPH 35 units bid + Novolog 5 units tid + Novolog resistant correction + hs  Inpatient Diabetes Program Recommendations:     Consider changing insulin to Levemir 50 units bid.  Once diet resumes consider increasing Novolog meal coverage to 10 units tid if eats 50%. A1C? Last once from 2018.  Thanks, Bronson Curb, MSN, RNC-OB Diabetes Coordinator [phone] (8a-5p)

## 2018-01-19 NOTE — Progress Notes (Signed)
PROGRESS NOTE    [name] [name]  POE:[id number] DOB: [birth year] DOA: 01/18/2018 PCP: [name], FNP  Outpatient Specialists:   Brief Narrative:  [name] [name] is a 67 year old male, obese, with past medical history significant for MI, hypertension, hyperlipidemia, diabetes mellitus, likely undiagnosed obstructive sleep apnea.  Patient presented to the hospital with chest pain.  She was transferred to [facility] for possible cardiac catheterization.  Cardiology input is appreciated.  Cardiology team is directing patient's care.  Patient has been advised to pursue sleep study.   Assessment & Plan:   Active Problems:   Chest pain   Unstable angina [facility])   Chest Pain  Coronary Artery Disease: pt with typical symptoms concerning for unstable angina.  CP currently resolved.  Pt with hx of DES to RCA in 2010.  Had cath 07/2016 with total occlusion of RCA as well as severe complex disease in the L circumflex which was treated with 2 DES.  EKG appears c/w priors.  CXR without acute findings. Cards c/s, appreciate recs - planning to hold eliquis and ARB and planning for angiography Follow troponins, repeat EKG prn sx S/p ASA Continue plavix Follow A1c and lipids Will transfer [facility] for [facility]ization  01/19/2018: Cardiology input is appreciated.  Patient is awaiting cardiac catheterization.  Atrial Fibrillation: currently holding eliquis given above  Hx of CVA: Stable.    HFpEF: Compensated.  Echo revealed normal EF, with grade 1 diastolic dysfunction.  HTN: Continue to optimize.    T2DM: Continue to optimize.    Hyperlipidemia:   continue fenofibrate.  Pt has hx of statin intolerance and intolerance of repatha per cards.  Depression  Anxiety: continue cymbalta  CKD stage III:  creatinine appears close to baseline, follow closely.  Hold lasix and arb. Gentle hydration to prevent contrast-induced nephropathy.  Hyponatremia:  Check urine sodium, urine  osmolality and serum osmolality.   Check TSH and cortisol.    Leukocytosis: Repeat CBC positive for the morning.   Likely undiagnosed OSA/OHS: Pursue outpatient sleep studies.  Gerd: PPI  DVT prophylaxis: eliquis, on hold for cath Code Status: DNR, confirmed at bedside with discussion with pt and his wife Family Communication: wife at bedside  Disposition Plan: transfer to [facility] Consults called: cardiology  Admission status: observation   Procedures:   Awaiting cardiac catheterization.  Antimicrobials:   None   Subjective: No chest pain.  No shortness of breath.  No fever chills.  Objective: Vitals:   01/19/18 1756 01/19/18 1801 01/19/18 1804 01/19/18 1833  BP: 131/77 137/81 139/79 (!) 164/84  Pulse: 88 86 84   Resp: 16 12 14    Temp:      TempSrc:      SpO2: 98% 98% 98%   Weight:      Height:        Intake/Output Summary (Last 24 hours) at 01/19/2018 2007 Last data filed at 01/19/2018 1600 Gross per 24 hour  Intake 1992.13 ml  Output -  Net 1992.13 ml   Filed Weights   01/18/18 0300 01/18/18 0535 01/18/18 2130  Weight: 130.2 kg 129.5 kg 130.4 kg    Examination:  General exam: Appears calm and comfortable.  Obese. Respiratory system: Clear to auscultation. Respiratory effort normal. Cardiovascular system: S1 & S2 heard. Gastrointestinal system: Abdomen is morbidly obese, soft and nontender.  Organs are not palpable.   Central nervous system: Alert and oriented. No focal neurological deficits. Extremities: Symmetric 5 x 5 power.   Data Reviewed: I have personally  reviewed following labs and imaging studies  CBC: Recent Labs  Lab 01/18/18 0310  WBC 15.9*  HGB 14.0  HCT 41.8  MCV 92.1  PLT 762   Basic Metabolic Panel: Recent Labs  Lab 01/18/18 0310 01/19/18 1217  NA 131* 130*  K 4.5 4.9  CL 94* 94*  CO2 28 25  GLUCOSE 353* 330*  BUN 23 29*  CREATININE 1.42* 1.47*  CALCIUM 9.9 9.8  MG 2.1  --    GFR: Estimated Creatinine  Clearance: 71 mL/min (A) (by C-G formula based on SCr of 1.47 mg/dL (H)). Liver Function Tests: No results for input(s): AST, ALT, ALKPHOS, BILITOT, PROT, ALBUMIN in the last 168 hours. No results for input(s): LIPASE, AMYLASE in the last 168 hours. No results for input(s): AMMONIA in the last 168 hours. Coagulation Profile: No results for input(s): INR, PROTIME in the last 168 hours. Cardiac Enzymes: Recent Labs  Lab 01/18/18 0908 01/18/18 1537  TROPONINI 0.03* 0.03*   BNP (last 3 results) No results for input(s): PROBNP in the last 8760 hours. HbA1C: Recent Labs    01/19/18 1538  HGBA1C 8.9*   CBG: Recent Labs  Lab 01/18/18 2106 01/19/18 0024 01/19/18 0815 01/19/18 1202 01/19/18 1837  GLUCAP 300* 340* 309* 343* 186*   Lipid Profile: No results for input(s): CHOL, HDL, LDLCALC, TRIG, CHOLHDL, LDLDIRECT in the last 72 hours. Thyroid Function Tests: No results for input(s): TSH, T4TOTAL, FREET4, T3FREE, THYROIDAB in the last 72 hours. Anemia Panel: No results for input(s): VITAMINB12, FOLATE, FERRITIN, TIBC, IRON, RETICCTPCT in the last 72 hours. Urine analysis:    Component Value Date/Time   COLORURINE YELLOW 05/21/2016 1350   APPEARANCEUR HAZY (A) 05/21/2016 1350   APPEARANCEUR Clear 02/11/2016 1149   LABSPEC 1.019 05/21/2016 1350   PHURINE 5.0 05/21/2016 1350   GLUCOSEU 50 (A) 05/21/2016 1350   HGBUR SMALL (A) 05/21/2016 1350   BILIRUBINUR NEGATIVE 05/21/2016 1350   BILIRUBINUR Negative 02/11/2016 1149   KETONESUR NEGATIVE 05/21/2016 1350   PROTEINUR 100 (A) 05/21/2016 1350   UROBILINOGEN negative 08/13/2014 1725   UROBILINOGEN 0.2 08/07/2013 1747   NITRITE NEGATIVE 05/21/2016 1350   LEUKOCYTESUR NEGATIVE 05/21/2016 1350   LEUKOCYTESUR Negative 02/11/2016 1149   Sepsis Labs: @LABRCNTIP (procalcitonin:4,lacticidven:4)  )No results found for this or any previous visit (from the past 240 hour(s)).       Radiology Studies: Dg Chest 2 View  Result  Date: 01/18/2018 CLINICAL DATA:  67 year old male with chest pain. EXAM: CHEST - 2 VIEW COMPARISON:  Chest radiograph dated 07/19/2016 FINDINGS: Stable mild cardiomegaly. No focal consolidation, pleural effusion, or pneumothorax. Minimal left lung base atelectatic changes. Loop recorder device. Partially visualized lower cervical fusion plate. No acute osseous pathology. IMPRESSION: No active cardiopulmonary disease. Electronically Signed   By: Anner Crete M.D.   On: 01/18/2018 03:57   Ct Head Wo Contrast  Result Date: 01/18/2018 CLINICAL DATA:  Syncopal episodes. Headache and chest pain. History of LEFT vertebral artery occlusion, hypertension, diabetes, hyperlipidemia. EXAM: CT HEAD WITHOUT CONTRAST TECHNIQUE: Contiguous axial images were obtained from the base of the skull through the vertex without intravenous contrast. COMPARISON:  MRI head May 22, 2016 FINDINGS: BRAIN: No intraparenchymal hemorrhage, mass effect nor midline shift. The ventricles and sulci are normal for age. Minimal supratentorial white matter hypodensities within normal range for patient's age, though non-specific are most compatible with chronic small vessel ischemic disease. No acute large vascular territory infarcts. Old LEFT basal ganglia lacunar infarct. No abnormal extra-axial fluid collections. Basal cisterns  are patent. VASCULAR: Moderate calcific atherosclerosis of the carotid siphons. SKULL: No skull fracture. Old nondisplaced LEFT and possibly RIGHT nasal bone fractures. No significant scalp soft tissue swelling. SINUSES/ORBITS: Trace paranasal sinus mucosal thickening. Mastoid air cells are well aerated.The included ocular globes and orbital contents are non-suspicious. OTHER: None. IMPRESSION: 1. No acute intracranial process. 2. Old LEFT basal ganglia lacunar infarct, otherwise negative non-contrast CT HEAD for age. Electronically Signed   By: Elon Alas M.D.   On: 01/18/2018 04:02        Scheduled  Meds: . clopidogrel  75 mg Oral Daily  . DULoxetine  30 mg Oral QHS  . fenofibrate  160 mg Oral Daily  . gabapentin  600 mg Oral BID  . insulin aspart  0-20 Units Subcutaneous TID WC  . insulin aspart  0-5 Units Subcutaneous QHS  . insulin aspart  10 Units Subcutaneous TID WC  . insulin detemir  50 Units Subcutaneous BID  . [START ON 01/20/2018] isosorbide mononitrate  60 mg Oral Daily  . metoprolol tartrate  12.5 mg Oral BID  . pantoprazole  40 mg Oral Daily  . sodium chloride flush  3 mL Intravenous Q12H   Continuous Infusions: . sodium chloride    . sodium chloride       LOS: 0 days    Time spent: 35 minutes.    Dana Allan, MD  [facility] Pager #: [phone] 7PM-7AM contact night coverage as above

## 2018-01-19 NOTE — Progress Notes (Signed)
  Echocardiogram 2D Echocardiogram has been performed.  [name] [name] 01/19/2018, 12:12 PM

## 2018-01-20 ENCOUNTER — Encounter (HOSPITAL_COMMUNITY): Payer: Self-pay | Admitting: Cardiovascular Disease

## 2018-01-20 DIAGNOSIS — F419 Anxiety disorder, unspecified: Secondary | ICD-10-CM | POA: Diagnosis not present

## 2018-01-20 DIAGNOSIS — M1990 Unspecified osteoarthritis, unspecified site: Secondary | ICD-10-CM | POA: Diagnosis not present

## 2018-01-20 DIAGNOSIS — I200 Unstable angina: Secondary | ICD-10-CM | POA: Diagnosis not present

## 2018-01-20 DIAGNOSIS — Z955 Presence of coronary angioplasty implant and graft: Secondary | ICD-10-CM | POA: Diagnosis not present

## 2018-01-20 DIAGNOSIS — I25110 Atherosclerotic heart disease of native coronary artery with unstable angina pectoris: Secondary | ICD-10-CM | POA: Diagnosis not present

## 2018-01-20 LAB — RENAL FUNCTION PANEL
Albumin: 3.3 g/dL — ABNORMAL LOW (ref 3.5–5.0)
Anion gap: 10 (ref 5–15)
BUN: 22 mg/dL (ref 8–23)
CO2: 24 mmol/L (ref 22–32)
Calcium: 9.2 mg/dL (ref 8.9–10.3)
Chloride: 98 mmol/L (ref 98–111)
Creatinine, Ser: 1.27 mg/dL — ABNORMAL HIGH (ref 0.61–1.24)
GFR calc Af Amer: >60 mL/min (ref >60)
GFR calc non Af Amer: 57 mL/min — ABNORMAL LOW (ref >60)
Glucose, Bld: 235 mg/dL — ABNORMAL HIGH (ref 70–99)
Phosphorus: 2.4 mg/dL — ABNORMAL LOW (ref 2.5–4.6)
Potassium: 4.1 mmol/L (ref 3.5–5.1)
Sodium: 132 mmol/L — ABNORMAL LOW (ref 135–145)

## 2018-01-20 LAB — CBC WITH DIFFERENTIAL/PLATELET
Abs Immature Granulocytes: 0.1 10*3/uL (ref 0.0–0.1)
Basophils Absolute: 0.1 10*3/uL (ref 0.0–0.1)
Basophils Relative: 1 %
Eosinophils Absolute: 0.3 10*3/uL (ref 0.0–0.7)
Eosinophils Relative: 3 %
HCT: 37.2 % — ABNORMAL LOW (ref 39.0–52.0)
Hemoglobin: 12 g/dL — ABNORMAL LOW (ref 13.0–17.0)
Immature Granulocytes: 1 %
Lymphocytes Relative: 19 %
Lymphs Abs: 1.8 10*3/uL (ref 0.7–4.0)
MCH: 29.9 pg (ref 26.0–34.0)
MCHC: 32.3 g/dL (ref 30.0–36.0)
MCV: 92.5 fL (ref 78.0–100.0)
Monocytes Absolute: 1 10*3/uL (ref 0.1–1.0)
Monocytes Relative: 10 %
Neutro Abs: 6.4 10*3/uL (ref 1.7–7.7)
Neutrophils Relative %: 66 %
Platelets: 279 10*3/uL (ref 150–400)
RBC: 4.02 MIL/uL — ABNORMAL LOW (ref 4.22–5.81)
RDW: 12.4 % (ref 11.5–15.5)
WBC: 9.6 10*3/uL (ref 4.0–10.5)

## 2018-01-20 LAB — GLUCOSE, CAPILLARY
Glucose-Capillary: 277 mg/dL — ABNORMAL HIGH (ref 70–99)
Glucose-Capillary: 288 mg/dL — ABNORMAL HIGH (ref 70–99)

## 2018-01-20 LAB — MAGNESIUM: Magnesium: 2.2 mg/dL (ref 1.7–2.4)

## 2018-01-20 MED ORDER — ISOSORBIDE MONONITRATE ER 60 MG PO TB24: 60.0000 mg | ORAL_TABLET | Freq: Every day | ORAL | 0 refills | Status: DC

## 2018-01-20 MED ORDER — METOPROLOL TARTRATE 25 MG PO TABS: 12.5000 mg | ORAL_TABLET | Freq: Two times a day (BID) | ORAL | 0 refills | Status: DC

## 2018-01-20 NOTE — Discharge Summary (Signed)
Physician Discharge Summary  [name] LKG:[id number] DOB: 1950/06/17 DOA: 01/18/2018  PCP: [name], FNP  Admit date: 01/18/2018 Discharge date: 01/20/2018  Time spent:  65 minutes  Recommendations for Outpatient Follow-up:  1. Follow up with Dr [name] 2-4 weeks 2. Follow up with PCP 1-2 weeks for evaluation of BP and diabetes control as well as arrange for sleep study   Discharge Diagnoses:  Principal Problem:   Chest pain Active Problems:   Unstable angina (HCC)   GERD (gastroesophageal reflux disease)   Diabetes ([place])   CHF (congestive heart failure) ([place])   Discharge Condition: stable  Diet recommendation: heart healthy carb modified  Filed Weights   01/18/18 0535 01/18/18 2130 01/20/18 0444  Weight: 129.5 kg 130.4 kg 130.7 kg    History of present illness:  [name] [name] a 67 year old male, obese, with past medical history significant for MI, hypertension, hyperlipidemia, diabetes mellitus, likely undiagnosed obstructive sleep apnea.  Patient presented to the hospital with chest pain.  He was transferred to [facility] for possible cardiac catheterization.    Hospital Course:  Chest Pain  Coronary Artery Disease: pt with typical symptoms concerning for unstable angina.Pt with hx of DES to RCA in 2010. Had cath 07/2016 with total occlusion of RCA as well as severe complex disease in the L circumflex which was treated with 2 DES. EKG appeared c/w priors. CXR without acute findings. Cards evaluated and recommended cath. Underwent cath 01/19/18 with recommendations for medical treatment CAD. Echo normal. Patient intolerate of statin. Discharge with plavix and apixaban no asa.  Follow up with cards 2-4 weeks.  Atrial Fibrillation:SR greater than 24 hours at discharge. Resume apixaban  Hx of CVA: Stable.    HFpEF:  Compensated during hospitalization.  Echo revealed normal EF, with grade 1 diastolic dysfunction.  HTN: Continue to optimize. follow up  with PCP for optimal control   T2DM: Continue to optimize.    Hyperlipidemia:   continue fenofibrate. Pt has hx of statin intolerance and intolerance of repatha per cards. Is in "study" per patient  Depression  Anxiety:stable during hospitalization  CKD stage III: creatinine at baseline at discharge  Hyponatremia: mild. Resolved at discharge .    Leukocytosis: resolved at discharge. Pt remained afebrile and hemodynamially stable.   Likely undiagnosed OSA/OHS: Pursue outpatient sleep studies.   Procedures:  Cardiac cath 01/19/18  Consultations:  [facility]  Discharge Exam: Vitals:   01/20/18 0444 01/20/18 0910  BP: (!) 161/87 (!) 166/87  Pulse: 74 82  Resp: 20   Temp: (!) 97.3 F (36.3 C)   SpO2: 100%     General: sitting on side of bed. Obese in no acute distress Cardiovascular: rrr no MGR no LE edema Respiratory: no increased work of breathing. BS distant but clear. No wheeze Discharge Instructions  Allergies as of 01/20/2018      Reactions   Shellfish Allergy Anaphylaxis, Other (See Comments)   Tongue swelling, hives Tongue swelling, hives   Sulfa Antibiotics Anaphylaxis, Rash   Tongue swelling, hives   Ace Inhibitors Other (See Comments), Cough   CKD, renal failure    Invokana [canagliflozin] Other (See Comments)   Syncope / dehydration   Metformin And Related Itching   Pravastatin Sodium Other (See Comments)   myalgias   Fenofibrate Other (See Comments)   Body aches   Iodine Other (See Comments)   ????   Lisinopril Cough   Livalo [pitavastatin] Other (See Comments)   Myalgias   Repatha [evolocumab]  Myalgias, flu like sx   Crestor [rosuvastatin] Other (See Comments)   Myalgias   Horse-derived Products Rash   Lexapro [escitalopram Oxalate] Other (See Comments)   Buzzing in ears,headache, felt like a zombie   Lipitor [atorvastatin] Other (See Comments)   myalgias   Tape Rash      Medication List    STOP taking these  medications   HYDROcodone-acetaminophen 10-325 MG tablet Commonly known as:  NORCO     TAKE these medications   clopidogrel 75 MG tablet Commonly known as:  PLAVIX TAKE 1 TABLET BY MOUTH ONCE DAILY WITH BREAKFAST   DULoxetine 30 MG capsule Commonly known as:  CYMBALTA Take 1 capsule (30 mg total) by mouth daily.   ELIQUIS 5 MG Tabs tablet Generic drug:  apixaban TAKE 1 TABLET BY MOUTH TWICE DAILY   fenofibrate 160 MG tablet TAKE 1 TABLET BY MOUTH ONCE DAILY FOR CHOLESTEROL AND  TRIGLYCERIDE   furosemide 20 MG tablet Commonly known as:  LASIX Take 1 tablet (20 mg total) by mouth daily.   gabapentin 300 MG capsule Commonly known as:  NEURONTIN Take 2 capsules (600 mg total) by mouth 2 (two) times daily.   Insulin Degludec 200 UNIT/ML Sopn Inject 16 Units into the skin at bedtime.   insulin lispro 100 UNIT/ML KiwkPen Commonly known as:  HUMALOG Inject 0.05-0.2 mLs (5-20 Units total) into the skin 3 (three) times daily with meals. Give enough for 1 month   insulin NPH-regular Human (70-30) 100 UNIT/ML injection Commonly known as:  NOVOLIN 70/30 Inject 90 Units into the skin 2 (two) times daily with a meal.   isosorbide mononitrate 60 MG 24 hr tablet Commonly known as:  IMDUR Take 1 tablet (60 mg total) by mouth daily. Start taking on:  01/21/2018 What changed:    medication strength  how much to take   linaclotide 72 MCG capsule Commonly known as:  LINZESS Take 1 capsule (72 mcg total) by mouth daily before breakfast. What changed:    when to take this  reasons to take this   metoprolol tartrate 25 MG tablet Commonly known as:  LOPRESSOR Take 0.5 tablets (12.5 mg total) by mouth 2 (two) times daily.   mometasone 50 MCG/ACT nasal spray Commonly known as:  NASONEX Place 2 sprays into the nose as needed.   nitroGLYCERIN 0.4 MG SL tablet Commonly known as:  NITROSTAT Place 1 tablet (0.4 mg total) under the tongue every 5 (five) minutes x 3 doses as needed  for chest pain.   pantoprazole 40 MG tablet Commonly known as:  PROTONIX Take 1 tablet (40 mg total) by mouth daily.   PROBIOTIC ACIDOPHILUS PO Take 1 capsule by mouth daily.   Semaglutide 0.25 or 0.5 MG/DOSE Sopn Inject 0.25 mg into the skin once a week for 28 days, THEN 0.5 mg once a week for 28 days. Start taking on:  12/10/2017   telmisartan 40 MG tablet Commonly known as:  MICARDIS TAKE ONE TABLET BY MOUTH ONCE DAILY (REPLACING LISINORIL)      Allergies  Allergen Reactions  . Shellfish Allergy Anaphylaxis and Other (See Comments)    Tongue swelling, hives Tongue swelling, hives  . Sulfa Antibiotics Anaphylaxis and Rash    Tongue swelling, hives  . Ace Inhibitors Other (See Comments) and Cough    CKD, renal failure   . Invokana [Canagliflozin] Other (See Comments)    Syncope / dehydration  . Metformin And Related Itching  . Pravastatin Sodium Other (See Comments)  myalgias  . Fenofibrate Other (See Comments)    Body aches  . Iodine Other (See Comments)    ????  . Lisinopril Cough  . Livalo [Pitavastatin] Other (See Comments)    Myalgias   . Repatha [Evolocumab]     Myalgias, flu like sx  . Crestor [Rosuvastatin] Other (See Comments)    Myalgias   . Horse-Derived Products Rash  . Lexapro [Escitalopram Oxalate] Other (See Comments)    Buzzing in ears,headache, felt like a zombie  . Lipitor [Atorvastatin] Other (See Comments)    myalgias  . Tape Rash      The results of significant diagnostics from this hospitalization (including imaging, microbiology, ancillary and laboratory) are listed below for reference.    Significant Diagnostic Studies: Dg Chest 2 View  Result Date: 01/18/2018 CLINICAL DATA:  67 year old male with chest pain. EXAM: CHEST - 2 VIEW COMPARISON:  Chest radiograph dated 07/19/2016 FINDINGS: Stable mild cardiomegaly. No focal consolidation, pleural effusion, or pneumothorax. Minimal left lung base atelectatic changes. Loop recorder  device. Partially visualized lower cervical fusion plate. No acute osseous pathology. IMPRESSION: No active cardiopulmonary disease. Electronically Signed   By: Anner Crete M.D.   On: 01/18/2018 03:57   Ct Head Wo Contrast  Result Date: 01/18/2018 CLINICAL DATA:  Syncopal episodes. Headache and chest pain. History of LEFT vertebral artery occlusion, hypertension, diabetes, hyperlipidemia. EXAM: CT HEAD WITHOUT CONTRAST TECHNIQUE: Contiguous axial images were obtained from the base of the skull through the vertex without intravenous contrast. COMPARISON:  MRI head May 22, 2016 FINDINGS: BRAIN: No intraparenchymal hemorrhage, mass effect nor midline shift. The ventricles and sulci are normal for age. Minimal supratentorial white matter hypodensities within normal range for patient's age, though non-specific are most compatible with chronic small vessel ischemic disease. No acute large vascular territory infarcts. Old LEFT basal ganglia lacunar infarct. No abnormal extra-axial fluid collections. Basal cisterns are patent. VASCULAR: Moderate calcific atherosclerosis of the carotid siphons. SKULL: No skull fracture. Old nondisplaced LEFT and possibly RIGHT nasal bone fractures. No significant scalp soft tissue swelling. SINUSES/ORBITS: Trace paranasal sinus mucosal thickening. Mastoid air cells are well aerated.The included ocular globes and orbital contents are non-suspicious. OTHER: None. IMPRESSION: 1. No acute intracranial process. 2. Old LEFT basal ganglia lacunar infarct, otherwise negative non-contrast CT HEAD for age. Electronically Signed   By: Elon Alas M.D.   On: 01/18/2018 04:02    Microbiology: No results found for this or any previous visit (from the past 240 hour(s)).   Labs: Basic Metabolic Panel: Recent Labs  Lab 01/18/18 0310 01/19/18 1217 01/20/18 0208  NA 131* 130* 132*  K 4.5 4.9 4.1  CL 94* 94* 98  CO2 28 25 24   GLUCOSE 353* 330* 235*  BUN 23 29* 22  CREATININE  1.42* 1.47* 1.27*  CALCIUM 9.9 9.8 9.2  MG 2.1  --  2.2  PHOS  --   --  2.4*   Liver Function Tests: Recent Labs  Lab 01/20/18 0208  ALBUMIN 3.3*   No results for input(s): LIPASE, AMYLASE in the last 168 hours. No results for input(s): AMMONIA in the last 168 hours. CBC: Recent Labs  Lab 01/18/18 0310 01/20/18 0208  WBC 15.9* 9.6  NEUTROABS  --  6.4  HGB 14.0 12.0*  HCT 41.8 37.2*  MCV 92.1 92.5  PLT 303 279   Cardiac Enzymes: Recent Labs  Lab 01/18/18 0908 01/18/18 1537  TROPONINI 0.03* 0.03*   BNP: BNP (last 3 results) No results for input(s): BNP  in the last 8760 hours.  ProBNP (last 3 results) No results for input(s): PROBNP in the last 8760 hours.  CBG: Recent Labs  Lab 01/19/18 0815 01/19/18 1202 01/19/18 1837 01/19/18 2019 01/20/18 0815  GLUCAP 309* 343* 186* 207* 277*       Signed:  Radene Gunning MD.  [facility] 01/20/2018, 11:33 AM

## 2018-01-20 NOTE — Progress Notes (Addendum)
Progress Note  Patient Name: [name] [name] Date of Encounter: 01/20/2018  Primary Cardiologist: Carlyle Dolly, MD   Subjective   No CP or SOB, working on dietary compliance, not smoking  Inpatient Medications    Scheduled Meds: . clopidogrel  75 mg Oral Daily  . DULoxetine  30 mg Oral QHS  . fenofibrate  160 mg Oral Daily  . gabapentin  600 mg Oral BID  . insulin aspart  0-20 Units Subcutaneous TID WC  . insulin aspart  0-5 Units Subcutaneous QHS  . insulin aspart  10 Units Subcutaneous TID WC  . insulin detemir  50 Units Subcutaneous BID  . isosorbide mononitrate  60 mg Oral Daily  . metoprolol tartrate  12.5 mg Oral BID  . pantoprazole  40 mg Oral Daily  . sodium chloride flush  3 mL Intravenous Q12H   Continuous Infusions: . sodium chloride 125 mL/hr at 01/20/18 0109  . sodium chloride     PRN Meds: sodium chloride, acetaminophen, alum & mag hydroxide-simeth, diazepam, HYDROcodone-acetaminophen, linaclotide, nitroGLYCERIN, ondansetron **OR** ondansetron (ZOFRAN) IV, sodium chloride flush   Vital Signs    Vitals:   01/19/18 2020 01/19/18 2305 01/20/18 0444 01/20/18 0910  BP: (!) 164/91 (!) 155/79 (!) 161/87 (!) 166/87  Pulse: 85 85 74 82  Resp:   20   Temp:   (!) 97.3 F (36.3 C)   TempSrc:   Oral   SpO2: 98% 98% 100%   Weight:   130.7 kg   Height:        Intake/Output Summary (Last 24 hours) at 01/20/2018 1020 Last data filed at 01/20/2018 0300 Gross per 24 hour  Intake 2602.13 ml  Output 250 ml  Net 2352.13 ml   Filed Weights   01/18/18 0535 01/18/18 2130 01/20/18 0444  Weight: 129.5 kg 130.4 kg 130.7 kg    Telemetry and Cardiac Studies    SR, rare PVCs - Personally Reviewed  ECHO: 01/19/2018 - Left ventricle: The cavity size was normal. Wall thickness was   normal. Systolic function was normal. The estimated ejection   fraction was in the range of 60% to 65%. Wall motion was normal;   there were no regional wall motion abnormalities. Doppler   parameters are consistent with abnormal left ventricular   relaxation (grade 1 diastolic dysfunction).  CATH: 01/19/2018  Prox LAD lesion is 40% stenosed.  Ost 1st Diag lesion is 40% stenosed.  Ost 2nd Diag to 2nd Diag lesion is 100% stenosed.  Mid Cx to Dist Cx lesion is 100% stenosed.  Prox RCA to Mid RCA lesion is 90% stenosed.  Prox RCA lesion is 99% stenosed.  Previously placed Ost 1st Mrg to 1st Mrg stent (unknown type) is widely patent.  Previously placed Prox Cx stent (unknown type) is widely patent.   Significant three-vessel coronary obstructive disease with previously noted 40% proximal LAD stenosis, 40% stenosis in the inferior branch of the first diagonal vessel with total occlusion of the second diagonal vessel with retrograde collateralization to this diagonal vessel;   Small normal ramus intermediate vessel;  Widely patent stent extending from the proximal circumflex into the OM1 vessel without stenosis, with old total occlusion of the mid distal circumflex after the second marginal vessel which is collateralized from the LAD and RCA;  Old subtotal/ total mid RCA stenosis at the site of prior stenting with extensive left-to-right collaterals as well as antegrade flow distally with competitive filling due to collateral flow and collateralization from the SA nodal artery  supplying the distal circumflex.  LVEDP 15 mm Hg.   RECOMMENDATION: Increase medical therapy.  Present catheterization is essentially unchanged from the prior study of March 2018.  Aggressive lipid-lowering therapy with target LDL less than 70. oPTIMAL blood pressure control with target blood pressure less than 130/80. consider evaluation for sleep apnea in this patient with atrial fibrillation.  Recommend to resume Apixaban, at currently prescribed dose and frequency, on 01/20/18.  Recommend concurrent antiplatelet therapy of Clopidogrel 75mg  daily.  Diagnostic Diagram          Physical  Exam   General: Well developed, well nourished, male in no acute distress Head: Eyes PERRLA, No xanthomas.   Normocephalic and atraumatic Lungs: Clear bilaterally to auscultation. Heart: HRRR S1 S2, without MRG.  Pulses are 2+ & equal. No JVD seen Abdomen: Bowel sounds are present, abdomen soft and non-tender without masses or  hernias noted. Msk: Normal strength and tone for age. Extremities: No clubbing, cyanosis or edema. R radial cath site w/out ecchymosis or hematoma. UE pulses 2+, LE pulses decreased, cap refill borderline long Skin:  No rashes or lesions noted. Neuro: Alert and oriented X 3. Psych:  Good affect, responds appropriately   Labs    Chemistry Recent Labs  Lab 01/18/18 0310 01/19/18 1217 01/20/18 0208  NA 131* 130* 132*  K 4.5 4.9 4.1  CL 94* 94* 98  CO2 28 25 24   GLUCOSE 353* 330* 235*  BUN 23 29* 22  CREATININE 1.42* 1.47* 1.27*  CALCIUM 9.9 9.8 9.2  ALBUMIN  --   --  3.3*  GFRNONAA 50* 48* 57*  GFRAA 58* 55* >60  ANIONGAP 9 11 10      Hematology Recent Labs  Lab 01/18/18 0310 01/20/18 0208  WBC 15.9* 9.6  RBC 4.54 4.02*  HGB 14.0 12.0*  HCT 41.8 37.2*  MCV 92.1 92.5  MCH 30.8 29.9  MCHC 33.5 32.3  RDW 12.9 12.4  PLT 303 279    Cardiac Enzymes Recent Labs  Lab 01/18/18 0908 01/18/18 1537  TROPONINI 0.03* 0.03*    Recent Labs  Lab 01/18/18 0311  TROPIPOC 0.01     Radiology    No results found.  Patient Profile     67 y.o. male with past medical history of coronary artery disease, diabetes mellitus, hyperlipidemia, paroxysmal atrial fibrillation, hypertension with unstable angina.  Assessment & Plan    1 unstable angina- - s/p cath 09/04 w/ med rx for CAD. - on ASA, Plavix - EF normal on echo - intol statins, pt and wife are willing to work on dietary indiscretions  2 paroxysmal atrial fibrillation- - in SR > 24 hr - restart apixaban today  3 chronic stage III kidney disease- - renal function trending down after  cath, no volume overload by exam - diuretics and ARB on hold   4 hyperlipidemia- - intol statin and Repatha>>research study  MD advise if pt can d/c today, f/u as outpt  For questions or updates, please contact [facility] Please consult [email or web address] for contact info under Cardiology/STEMI.      Signed, Rosaria Ferries, PA-C  01/20/2018, 10:20 AM   As above, patient seen and examined.  He denies chest pain or dyspnea.  Radial cath site shows no hematoma.  Patient can be discharged today.  Would resume all preadmission medications including Plavix and apixaban.  No aspirin.  Resume Micardis and Lasix.  Patient would like to follow-up with Dr. [name].  Would arrange in 2 to 4 weeks. [facility]  will sign off.   Medication Recommendations: As outlined above Other recommendations (labs, testing, etc): No additional testing Follow up as an outpatient:  FU Dr [name] 2 weeks  Kirk Ruths, MD

## 2018-01-21 ENCOUNTER — Telehealth: Payer: Self-pay | Admitting: *Deleted

## 2018-01-21 NOTE — Telephone Encounter (Signed)
Call Completed and Appointment Scheduled: Yes, Date: 01/25/18 with [name], FNP   DISCHARGE INFORMATION Date of Discharge:01/20/18  Discharge Facility: [facility]   Principal Discharge Diagnosis: Chest pain  Patient and/or caregiver is knowledgeable of his/her condition(s) and treatment: Yes  MEDICATION RECONCILIATION Current medication list reviewed with patient:Yes  Outpatient Encounter Medications as of 01/21/2018  Medication Sig  . clopidogrel (PLAVIX) 75 MG tablet TAKE 1 TABLET BY MOUTH ONCE DAILY WITH BREAKFAST  . DULoxetine (CYMBALTA) 30 MG capsule Take 1 capsule (30 mg total) by mouth daily.  Marland Kitchen ELIQUIS 5 MG TABS tablet TAKE 1 TABLET BY MOUTH TWICE DAILY  . fenofibrate 160 MG tablet TAKE 1 TABLET BY MOUTH ONCE DAILY FOR CHOLESTEROL AND  TRIGLYCERIDE  . furosemide (LASIX) 20 MG tablet Take 1 tablet (20 mg total) by mouth daily.  Marland Kitchen gabapentin (NEURONTIN) 300 MG capsule Take 2 capsules (600 mg total) by mouth 2 (two) times daily.  . Insulin Degludec (TRESIBA FLEXTOUCH) 200 UNIT/ML SOPN Inject 16 Units into the skin at bedtime.  . insulin lispro (HUMALOG KWIKPEN) 100 UNIT/ML KiwkPen Inject 0.05-0.2 mLs (5-20 Units total) into the skin 3 (three) times daily with meals. Give enough for 1 month  . insulin NPH-regular Human (HUMULIN 70/30) (70-30) 100 UNIT/ML injection Inject 90 Units into the skin 2 (two) times daily with a meal.  . isosorbide mononitrate (IMDUR) 60 MG 24 hr tablet Take 1 tablet (60 mg total) by mouth daily.  . Lactobacillus (PROBIOTIC ACIDOPHILUS PO) Take 1 capsule by mouth daily.  Marland Kitchen linaclotide (LINZESS) 72 MCG capsule Take 1 capsule (72 mcg total) by mouth daily before breakfast. (Patient taking differently: Take 72 mcg by mouth daily as needed. )  . metoprolol tartrate (LOPRESSOR) 25 MG tablet Take 0.5 tablets (12.5 mg total) by mouth 2 (two) times daily.  . mometasone (NASONEX) 50 MCG/ACT nasal spray Place 2 sprays into the nose as needed.  . nitroGLYCERIN (NITROSTAT)  0.4 MG SL tablet Place 1 tablet (0.4 mg total) under the tongue every 5 (five) minutes x 3 doses as needed for chest pain.  . pantoprazole (PROTONIX) 40 MG tablet Take 1 tablet (40 mg total) by mouth daily.  . Semaglutide (OZEMPIC) 0.25 or 0.5 MG/DOSE SOPN Inject 0.25 mg into the skin once a week for 28 days, THEN 0.5 mg once a week for 28 days.  Marland Kitchen telmisartan (MICARDIS) 40 MG tablet TAKE ONE TABLET BY MOUTH ONCE DAILY (REPLACING LISINORIL)   No facility-administered encounter medications on file as of 01/21/2018.     Discharge Medications reviewed and reconciled with current medications.yes  Patient is able to obtain needed medications:Yes  ACTIVITIES OF DAILY LIVING  Is the patient able to perform his/her own ADLs: Yes.    Patient is receiving home health services: No.  PATIENT EDUCATION Questions/Concerns Discussed: Discussed sleep study that was recommended at discharge. Would consider doing a home sleep study.

## 2018-01-24 ENCOUNTER — Telehealth: Payer: Self-pay

## 2018-01-24 NOTE — Telephone Encounter (Signed)
Spoke w/ pt and requested that he send a manual transmission b/c his home monitor has not updated in at least 14 days.   

## 2018-01-25 ENCOUNTER — Ambulatory Visit (INDEPENDENT_AMBULATORY_CARE_PROVIDER_SITE_OTHER): Payer: Medicare Other | Admitting: Family

## 2018-01-25 ENCOUNTER — Encounter: Payer: Self-pay | Admitting: Family

## 2018-01-25 VITALS — BP 122/76 | HR 100 | Temp 97.8°F | Ht 75.0 in | Wt 292.4 lb

## 2018-01-25 DIAGNOSIS — R0683 Snoring: Secondary | ICD-10-CM | POA: Diagnosis not present

## 2018-01-25 DIAGNOSIS — Z09 Encounter for follow-up examination after completed treatment for conditions other than malignant neoplasm: Secondary | ICD-10-CM | POA: Diagnosis not present

## 2018-01-25 DIAGNOSIS — R5383 Other fatigue: Secondary | ICD-10-CM

## 2018-01-25 DIAGNOSIS — R531 Weakness: Secondary | ICD-10-CM | POA: Diagnosis not present

## 2018-01-25 DIAGNOSIS — I200 Unstable angina: Secondary | ICD-10-CM

## 2018-01-25 NOTE — Progress Notes (Addendum)
   Subjective:    Patient ID: [name] [name], male    DOB: Dec 07, 1950, 67 y.o.   MRN: [id number]  Chief Complaint  Patient presents with  . Hospitalization Follow-up   Pt presents to the office today for transitional care management. Pt was admitted to ED on 01/18/18 and discharged on 01/20/18. He was admitted with chest pain and ruled out MI with cath. Told to continue Plavix and Eliquis. Nurse contacted patient on 01/21/18 by telephone and medications reviewed.    It was suggested that he have a sleep study.  Chest Pain   The pain is at a severity of 0/10. The patient is experiencing no pain. Associated symptoms include dizziness.      Review of Systems  Constitutional: Positive for fatigue.  Cardiovascular: Positive for chest pain.  Neurological: Positive for dizziness and light-headedness.  All other systems reviewed and are negative.      Objective:   Physical Exam  Constitutional: He is oriented to person, place, and time. He appears well-developed and well-nourished. No distress.  HENT:  Head: Normocephalic.  Eyes: Pupils are equal, round, and reactive to light. Right eye exhibits no discharge. Left eye exhibits no discharge.  Neck: Normal range of motion. Neck supple. No thyromegaly present.  Cardiovascular: Normal rate, regular rhythm, normal heart sounds and intact distal pulses.  No murmur heard. Pulmonary/Chest: Effort normal and breath sounds normal. No respiratory distress. He has no wheezes.  Abdominal: Soft. Bowel sounds are normal. He exhibits no distension. There is no tenderness.  Musculoskeletal: Normal range of motion. He exhibits no edema or tenderness.  Neurological: He is alert and oriented to person, place, and time. He has normal reflexes. No cranial nerve deficit.  Skin: Skin is warm and dry. No rash noted. No erythema.  Psychiatric: He has a normal mood and affect. His behavior is normal. Judgment and thought content normal.  Vitals  reviewed.     BP 122/76   Pulse 100   Temp 97.8 F (36.6 C) (Oral)   Ht _0  (1.905 m)   Wt 292 lb 6.4 oz (132.6 kg)   BMI 36.55 kg/m      Assessment & Plan:  [name] [name] comes in today with chief complaint of Hospitalization Follow-up   Diagnosis and orders addressed:  1. Unstable angina pectoris (HCC) - CMP14+EGFR - CBC with Differential/Platelet - Ambulatory referral to Neurology  2. Morbid obesity ([facility]) - CMP14+EGFR - CBC with Differential/Platelet - Ambulatory referral to Neurology  3. Snoring - CMP14+EGFR - CBC with Differential/Platelet - Ambulatory referral to Neurology  4. Fatigue, unspecified type - CMP14+EGFR - CBC with Differential/Platelet - Ambulatory referral to Neurology  5. Hospital discharge follow-up - CMP14+EGFR - CBC with Differential/Platelet   Labs pending Medications reviewed today Health Maintenance reviewed Diet and exercise encouraged  Follow up plan: Keep chronic follow up, Keep cardiologists follow up. Continue all medications and referral pending for sleep study    [name], FNP

## 2018-01-25 NOTE — Patient Instructions (Signed)

## 2018-01-26 DIAGNOSIS — R531 Weakness: Secondary | ICD-10-CM | POA: Diagnosis not present

## 2018-01-26 LAB — CBC WITH DIFFERENTIAL/PLATELET
Basophils Absolute: 0.1 10*3/uL (ref 0.0–0.2)
Basos: 1 %
EOS (ABSOLUTE): 0.2 10*3/uL (ref 0.0–0.4)
Eos: 2 %
Hematocrit: 41.2 % (ref 37.5–51.0)
Hemoglobin: 13.8 g/dL (ref 13.0–17.7)
Immature Grans (Abs): 0.1 10*3/uL (ref 0.0–0.1)
Immature Granulocytes: 1 %
Lymphocytes Absolute: 2 10*3/uL (ref 0.7–3.1)
Lymphs: 20 %
MCH: 29.7 pg (ref 26.6–33.0)
MCHC: 33.5 g/dL (ref 31.5–35.7)
MCV: 89 fL (ref 79–97)
Monocytes Absolute: 0.7 10*3/uL (ref 0.1–0.9)
Monocytes: 7 %
Neutrophils Absolute: 6.7 10*3/uL (ref 1.4–7.0)
Neutrophils: 69 %
Platelets: 347 10*3/uL (ref 150–450)
RBC: 4.64 x10E6/uL (ref 4.14–5.80)
RDW: 12.2 % — ABNORMAL LOW (ref 12.3–15.4)
WBC: 9.7 10*3/uL (ref 3.4–10.8)

## 2018-01-26 LAB — CMP14+EGFR
ALT: 33 IU/L (ref 0–44)
AST: 33 IU/L (ref 0–40)
Albumin/Globulin Ratio: 1.5 (ref 1.2–2.2)
Albumin: 4.4 g/dL (ref 3.6–4.8)
Alkaline Phosphatase: 77 IU/L (ref 39–117)
BUN/Creatinine Ratio: 17 (ref 10–24)
BUN: 25 mg/dL (ref 8–27)
Bilirubin Total: 0.5 mg/dL (ref 0.0–1.2)
CO2: 26 mmol/L (ref 20–29)
Calcium: 10.4 mg/dL — ABNORMAL HIGH (ref 8.6–10.2)
Chloride: 92 mmol/L — ABNORMAL LOW (ref 96–106)
Creatinine, Ser: 1.51 mg/dL — ABNORMAL HIGH (ref 0.76–1.27)
GFR calc Af Amer: 54 mL/min/{1.73_m2} — ABNORMAL LOW (ref >59)
GFR calc non Af Amer: 47 mL/min/{1.73_m2} — ABNORMAL LOW (ref >59)
Globulin, Total: 3 g/dL (ref 1.5–4.5)
Glucose: 280 mg/dL — ABNORMAL HIGH (ref 65–99)
Potassium: 4.7 mmol/L (ref 3.5–5.2)
Sodium: 136 mmol/L (ref 134–144)
Total Protein: 7.4 g/dL (ref 6.0–8.5)

## 2018-02-07 ENCOUNTER — Ambulatory Visit (INDEPENDENT_AMBULATORY_CARE_PROVIDER_SITE_OTHER): Payer: Medicare Other | Admitting: *Deleted

## 2018-02-07 DIAGNOSIS — I639 Cerebral infarction, unspecified: Secondary | ICD-10-CM | POA: Diagnosis not present

## 2018-02-07 LAB — CUP PACEART REMOTE DEVICE CHECK
Date Time Interrogation Session: 20190820013528
Implantable Pulse Generator Implant Date: 20180108

## 2018-02-07 NOTE — Progress Notes (Signed)
Carelink Summary Report / Loop Recorder 

## 2018-02-14 LAB — CUP PACEART REMOTE DEVICE CHECK
Date Time Interrogation Session: 20190922014141
Implantable Pulse Generator Implant Date: 20180108

## 2018-02-18 ENCOUNTER — Other Ambulatory Visit: Payer: Self-pay | Admitting: Family

## 2018-02-18 DIAGNOSIS — Z9861 Coronary angioplasty status: Principal | ICD-10-CM

## 2018-02-18 DIAGNOSIS — I2510 Atherosclerotic heart disease of native coronary artery without angina pectoris: Secondary | ICD-10-CM

## 2018-02-18 DIAGNOSIS — I5030 Unspecified diastolic (congestive) heart failure: Secondary | ICD-10-CM

## 2018-02-21 ENCOUNTER — Telehealth: Payer: Self-pay | Admitting: *Deleted

## 2018-02-21 NOTE — Telephone Encounter (Signed)
Spoke with patient regarding tachy episode noted on LINQ from 02/15/18 at 1328, duration 12sec, median V rate 188bpm. ECG appears SVT. Patient does not recall any symptoms with episode. Requested that patient call the DC if he has any presyncopal/syncopal episodes going forward. Patient is agreeable, took down DC phone number.  Attempted to schedule f/u with Dr. Lovena Le per recall, but patient reports his wife will have to call back to schedule. Patient reports past dizziness was determined to be related to low BPs, metoprolol d/c by PCP. Patient reports he is still taking IMDUR and telmisartan as instructed. Advised patient that Dr. Lovena Le will review episode and we will call him back with any recommendations. Patient verbalizes agreement with plan and denies questions or concerns at this time.  ECG printed and placed in Dr. Macon Large folder for review.

## 2018-02-28 ENCOUNTER — Telehealth: Payer: Self-pay

## 2018-02-28 DIAGNOSIS — Z9861 Coronary angioplasty status: Principal | ICD-10-CM

## 2018-02-28 DIAGNOSIS — I5030 Unspecified diastolic (congestive) heart failure: Secondary | ICD-10-CM

## 2018-02-28 DIAGNOSIS — I2510 Atherosclerotic heart disease of native coronary artery without angina pectoris: Secondary | ICD-10-CM

## 2018-02-28 MED ORDER — FUROSEMIDE 20 MG PO TABS: 20.0000 mg | ORAL_TABLET | Freq: Two times a day (BID) | ORAL | 1 refills | Status: DC

## 2018-02-28 NOTE — Telephone Encounter (Signed)
Wife states that patient is taking 2 lasix daily and the amount that was sent in is not enough. Ok to change to twice daily? Please advise

## 2018-02-28 NOTE — Telephone Encounter (Signed)
Rx changed and sent pharmacy

## 2018-03-10 ENCOUNTER — Ambulatory Visit (INDEPENDENT_AMBULATORY_CARE_PROVIDER_SITE_OTHER): Payer: Medicare Other | Admitting: *Deleted

## 2018-03-10 DIAGNOSIS — R55 Syncope and collapse: Secondary | ICD-10-CM | POA: Diagnosis not present

## 2018-03-11 NOTE — Progress Notes (Signed)
Carelink Summary Report / Loop Recorder 

## 2018-03-14 ENCOUNTER — Ambulatory Visit (INDEPENDENT_AMBULATORY_CARE_PROVIDER_SITE_OTHER): Payer: Medicare Other | Admitting: Family

## 2018-03-14 ENCOUNTER — Encounter: Payer: Self-pay | Admitting: Family

## 2018-03-14 VITALS — BP 118/72 | HR 99 | Temp 96.8°F | Ht 75.0 in | Wt 294.4 lb

## 2018-03-14 DIAGNOSIS — E1142 Type 2 diabetes mellitus with diabetic polyneuropathy: Secondary | ICD-10-CM | POA: Diagnosis not present

## 2018-03-14 DIAGNOSIS — F419 Anxiety disorder, unspecified: Secondary | ICD-10-CM

## 2018-03-14 DIAGNOSIS — I2510 Atherosclerotic heart disease of native coronary artery without angina pectoris: Secondary | ICD-10-CM | POA: Diagnosis not present

## 2018-03-14 DIAGNOSIS — I5030 Unspecified diastolic (congestive) heart failure: Secondary | ICD-10-CM

## 2018-03-14 DIAGNOSIS — Z794 Long term (current) use of insulin: Secondary | ICD-10-CM

## 2018-03-14 DIAGNOSIS — E1159 Type 2 diabetes mellitus with other circulatory complications: Secondary | ICD-10-CM | POA: Diagnosis not present

## 2018-03-14 DIAGNOSIS — F331 Major depressive disorder, recurrent, moderate: Secondary | ICD-10-CM

## 2018-03-14 DIAGNOSIS — I10 Essential (primary) hypertension: Secondary | ICD-10-CM

## 2018-03-14 DIAGNOSIS — I252 Old myocardial infarction: Secondary | ICD-10-CM

## 2018-03-14 DIAGNOSIS — Z9861 Coronary angioplasty status: Secondary | ICD-10-CM | POA: Diagnosis not present

## 2018-03-14 DIAGNOSIS — Z125 Encounter for screening for malignant neoplasm of prostate: Secondary | ICD-10-CM

## 2018-03-14 DIAGNOSIS — E1169 Type 2 diabetes mellitus with other specified complication: Secondary | ICD-10-CM

## 2018-03-14 DIAGNOSIS — E785 Hyperlipidemia, unspecified: Secondary | ICD-10-CM

## 2018-03-14 LAB — BAYER DCA HB A1C WAIVED: HB A1C (BAYER DCA - WAIVED): 8 % — ABNORMAL HIGH (ref <7.0)

## 2018-03-14 MED ORDER — GABAPENTIN 300 MG PO CAPS: 600.0000 mg | ORAL_CAPSULE | Freq: Three times a day (TID) | ORAL | 1 refills | Status: DC

## 2018-03-14 NOTE — Patient Instructions (Signed)
Diabetes Mellitus and Nutrition When you have diabetes (diabetes mellitus), it is very important to have healthy eating habits because your blood sugar (glucose) levels are greatly affected by what you eat and drink. Eating healthy foods in the appropriate amounts, at about the same times every day, can help you:  Control your blood glucose.  Lower your risk of heart disease.  Improve your blood pressure.  Reach or maintain a healthy weight.  Every person with diabetes is different, and each person has different needs for a meal plan. Your health care provider may recommend that you work with a diet and nutrition specialist (dietitian) to make a meal plan that is best for you. Your meal plan may vary depending on factors such as:  The calories you need.  The medicines you take.  Your weight.  Your blood glucose, blood pressure, and cholesterol levels.  Your activity level.  Other health conditions you have, such as heart or kidney disease.  How do carbohydrates affect me? Carbohydrates affect your blood glucose level more than any other type of food. Eating carbohydrates naturally increases the amount of glucose in your blood. Carbohydrate counting is a method for keeping track of how many carbohydrates you eat. Counting carbohydrates is important to keep your blood glucose at a healthy level, especially if you use insulin or take certain oral diabetes medicines. It is important to know how many carbohydrates you can safely have in each meal. This is different for every person. Your dietitian can help you calculate how many carbohydrates you should have at each meal and for snack. Foods that contain carbohydrates include:  Bread, cereal, rice, pasta, and crackers.  Potatoes and corn.  Peas, beans, and lentils.  Milk and yogurt.  Fruit and juice.  Desserts, such as cakes, cookies, ice cream, and candy.  How does alcohol affect me? Alcohol can cause a sudden decrease in blood  glucose (hypoglycemia), especially if you use insulin or take certain oral diabetes medicines. Hypoglycemia can be a life-threatening condition. Symptoms of hypoglycemia (sleepiness, dizziness, and confusion) are similar to symptoms of having too much alcohol. If your health care provider says that alcohol is safe for you, follow these guidelines:  Limit alcohol intake to no more than 1 drink per day for nonpregnant women and 2 drinks per day for men. One drink equals 12 oz of beer, 5 oz of wine, or 1 oz of hard liquor.  Do not drink on an empty stomach.  Keep yourself hydrated with water, diet soda, or unsweetened iced tea.  Keep in mind that regular soda, juice, and other mixers may contain a lot of sugar and must be counted as carbohydrates.  What are tips for following this plan? Reading food labels  Start by checking the serving size on the label. The amount of calories, carbohydrates, fats, and other nutrients listed on the label are based on one serving of the food. Many foods contain more than one serving per package.  Check the total grams (g) of carbohydrates in one serving. You can calculate the number of servings of carbohydrates in one serving by dividing the total carbohydrates by 15. For example, if a food has 30 g of total carbohydrates, it would be equal to 2 servings of carbohydrates.  Check the number of grams (g) of saturated and trans fats in one serving. Choose foods that have low or no amount of these fats.  Check the number of milligrams (mg) of sodium in one serving. Most people   should limit total sodium intake to less than 2,300 mg per day.  Always check the nutrition information of foods labeled as "low-fat" or "nonfat". These foods may be higher in added sugar or refined carbohydrates and should be avoided.  Talk to your dietitian to identify your daily goals for nutrients listed on the label. Shopping  Avoid buying canned, premade, or processed foods. These  foods tend to be high in fat, sodium, and added sugar.  Shop around the outside edge of the grocery store. This includes fresh fruits and vegetables, bulk grains, fresh meats, and fresh dairy. Cooking  Use low-heat cooking methods, such as baking, instead of high-heat cooking methods like deep frying.  Cook using healthy oils, such as olive, canola, or sunflower oil.  Avoid cooking with butter, cream, or high-fat meats. Meal planning  Eat meals and snacks regularly, preferably at the same times every day. Avoid going long periods of time without eating.  Eat foods high in fiber, such as fresh fruits, vegetables, beans, and whole grains. Talk to your dietitian about how many servings of carbohydrates you can eat at each meal.  Eat 4-6 ounces of lean protein each day, such as lean meat, chicken, fish, eggs, or tofu. 1 ounce is equal to 1 ounce of meat, chicken, or fish, 1 egg, or 1/4 cup of tofu.  Eat some foods each day that contain healthy fats, such as avocado, nuts, seeds, and fish. Lifestyle   Check your blood glucose regularly.  Exercise at least 30 minutes 5 or more days each week, or as told by your health care provider.  Take medicines as told by your health care provider.  Do not use any products that contain nicotine or tobacco, such as cigarettes and e-cigarettes. If you need help quitting, ask your health care provider.  Work with a counselor or diabetes educator to identify strategies to manage stress and any emotional and social challenges. What are some questions to ask my health care provider?  Do I need to meet with a diabetes educator?  Do I need to meet with a dietitian?  What number can I call if I have questions?  When are the best times to check my blood glucose? Where to find more information:  American Diabetes Association: diabetes.org/food-and-fitness/food  Academy of Nutrition and Dietetics:  www.eatright.org/resources/health/diseases-and-conditions/diabetes  National Institute of Diabetes and Digestive and Kidney Diseases (NIH): www.niddk.nih.gov/health-information/diabetes/overview/diet-eating-physical-activity Summary  A healthy meal plan will help you control your blood glucose and maintain a healthy lifestyle.  Working with a diet and nutrition specialist (dietitian) can help you make a meal plan that is best for you.  Keep in mind that carbohydrates and alcohol have immediate effects on your blood glucose levels. It is important to count carbohydrates and to use alcohol carefully. This information is not intended to replace advice given to you by your health care provider. Make sure you discuss any questions you have with your health care provider. Document Released: 01/29/2005 Document Revised: 06/08/2016 Document Reviewed: 06/08/2016 Elsevier Interactive Patient Education  2018 [employer]  

## 2018-03-14 NOTE — Progress Notes (Signed)
Subjective:    Patient ID: [name] [name], male    DOB: 05-17-1951, 67 y.o.   MRN: [id number]  Chief Complaint  Patient presents with  . Medical Management of Chronic Issues    three month recheck    PT presents to the office today for chronic follow up. PT had NSTEMI on 07/30/16 and stent placed. Pt is followed by Cardiologistsevery 4 months. Pt currently in a clinical trial for hyperlipidemia.   Pt is followed by Neurologists annually.  Pt complaining nonhealing pressure sore on buttocks that he has had for years.  Diabetes  He presents for his follow-up diabetic visit. He has type 2 diabetes mellitus. His disease course has been stable. Hypoglycemia symptoms include nervousness/anxiousness. Associated symptoms include foot paresthesias. Symptoms are worsening. Diabetic complications include heart disease and peripheral neuropathy. Risk factors for coronary artery disease include diabetes mellitus, dyslipidemia, family history, hypertension, male sex, obesity and sedentary lifestyle. He is following a generally unhealthy diet.  Hypertension  This is a chronic problem. The current episode started more than 1 year ago. The problem has been resolved since onset. The problem is controlled. Associated symptoms include anxiety, malaise/fatigue and peripheral edema. Pertinent negatives include no shortness of breath. Risk factors for coronary artery disease include dyslipidemia, diabetes mellitus, family history, obesity, male gender and sedentary lifestyle. The current treatment provides moderate improvement. Hypertensive end-organ damage includes kidney disease, CAD/MI and heart failure.  Hyperlipidemia  This is a chronic problem. The current episode started more than 1 year ago. The problem is uncontrolled. Recent lipid tests were reviewed and are high. Exacerbating diseases include obesity. Pertinent negatives include no shortness of breath. The current treatment provides moderate improvement  of lipids. Risk factors for coronary artery disease include diabetes mellitus, dyslipidemia, family history, male sex, hypertension and a sedentary lifestyle.  Depression         This is a chronic problem.  The current episode started more than 1 year ago.   The onset quality is gradual.   The problem occurs intermittently.  The problem has been waxing and waning since onset.  Associated symptoms include decreased concentration, irritable, restlessness and sad.  Associated symptoms include no helplessness and no hopelessness.  Past treatments include SNRIs - Serotonin and norepinephrine reuptake inhibitors.  Past medical history includes anxiety.   Anxiety  Presents for follow-up visit. Symptoms include decreased concentration, excessive worry, irritability, nervous/anxious behavior and restlessness. Patient reports no shortness of breath. Symptoms occur constantly.    Diabetic Neuropathy Pt states he has intermittent aching pain of 10 out 10. He is currently taking Gabapentin 600 mg BID.     Review of Systems  Constitutional: Positive for irritability and malaise/fatigue.  Respiratory: Negative for shortness of breath.   Psychiatric/Behavioral: Positive for decreased concentration and depression. The patient is nervous/anxious.   All other systems reviewed and are negative.      Objective:   Physical Exam  Constitutional: He is oriented to person, place, and time. He appears well-developed and well-nourished. He is irritable. No distress.  HENT:  Head: Normocephalic.  Right Ear: External ear normal.  Left Ear: External ear normal.  Mouth/Throat: Oropharynx is clear and moist.  Eyes: Pupils are equal, round, and reactive to light. Right eye exhibits no discharge. Left eye exhibits no discharge.  Neck: Normal range of motion. Neck supple. No thyromegaly present.  Cardiovascular: Normal rate, regular rhythm, normal heart sounds and intact distal pulses.  No murmur  heard. Pulmonary/Chest: Effort normal and breath  sounds normal. No respiratory distress. He has no wheezes.  Abdominal: Soft. Bowel sounds are normal. He exhibits no distension. There is no tenderness.  Musculoskeletal: He exhibits no edema or tenderness.  Pain in lower lumbar with flexion and extension  Neurological: He is alert and oriented to person, place, and time. He has normal reflexes. No cranial nerve deficit.  Skin: Skin is warm and dry. No rash noted. No erythema.  Psychiatric: He has a normal mood and affect. His behavior is normal. Judgment and thought content normal.  Vitals reviewed.     BP 118/72   Pulse 99   Temp (!) 96.8 F (36 C) (Oral)   Ht _0  (1.905 m)   Wt 294 lb 6.4 oz (133.5 kg)   BMI 36.80 kg/m      Assessment & Plan:  [name] [name] comes in today with chief complaint of Medical Management of Chronic Issues (three month recheck)   Diagnosis and orders addressed:  1. Hypertension associated with diabetes ([place]) - CMP14+EGFR  2. CAD S/P percutaneous coronary angioplasty - WPY09+XIPJ  3. Diastolic congestive heart failure, unspecified HF chronicity (HCC) - CMP14+EGFR  4. Hyperlipidemia associated with type 2 diabetes mellitus (HCC) - CMP14+EGFR - Lipid panel  5. Diabetic peripheral neuropathy associated with type 2 diabetes mellitus (HCC) - CMP14+EGFR  6. Diabetic polyneuropathy associated with type 2 diabetes mellitus ([place]) - CMP14+EGFR  7. Type 2 diabetes mellitus with diabetic polyneuropathy, with long-term current use of insulin (HCC) - Bayer DCA Hb A1c Waived - CMP14+EGFR  8. Moderate episode of recurrent major depressive disorder (HCC) - CMP14+EGFR  9. Morbid obesity ([place]) - CMP14+EGFR  10. History of non-ST elevation myocardial infarction (NSTEMI) - CMP14+EGFR  11. Anxiety - CMP14+EGFR  12. Prostate cancer screening - CMP14+EGFR - PSA, total and free   Labs pending Health Maintenance reviewed Diet and exercise  encouraged  Follow up plan: 3 months    [name], FNP

## 2018-03-15 ENCOUNTER — Other Ambulatory Visit: Payer: Self-pay | Admitting: Family

## 2018-03-15 LAB — CMP14+EGFR
ALT: 31 IU/L (ref 0–44)
AST: 42 IU/L — ABNORMAL HIGH (ref 0–40)
Albumin/Globulin Ratio: 1.6 (ref 1.2–2.2)
Albumin: 4.4 g/dL (ref 3.6–4.8)
Alkaline Phosphatase: 68 IU/L (ref 39–117)
BUN/Creatinine Ratio: 17 (ref 10–24)
BUN: 24 mg/dL (ref 8–27)
Bilirubin Total: 0.5 mg/dL (ref 0.0–1.2)
CO2: 23 mmol/L (ref 20–29)
Calcium: 10.1 mg/dL (ref 8.6–10.2)
Chloride: 96 mmol/L (ref 96–106)
Creatinine, Ser: 1.4 mg/dL — ABNORMAL HIGH (ref 0.76–1.27)
GFR calc Af Amer: 60 mL/min/{1.73_m2} (ref >59)
GFR calc non Af Amer: 52 mL/min/{1.73_m2} — ABNORMAL LOW (ref >59)
Globulin, Total: 2.8 g/dL (ref 1.5–4.5)
Glucose: 244 mg/dL — ABNORMAL HIGH (ref 65–99)
Potassium: 4.2 mmol/L (ref 3.5–5.2)
Sodium: 138 mmol/L (ref 134–144)
Total Protein: 7.2 g/dL (ref 6.0–8.5)

## 2018-03-15 LAB — LIPID PANEL
Chol/HDL Ratio: 14.4 ratio — ABNORMAL HIGH (ref 0.0–5.0)
Cholesterol, Total: 230 mg/dL — ABNORMAL HIGH (ref 100–199)
HDL: 16 mg/dL — ABNORMAL LOW (ref >39)
Triglycerides: 541 mg/dL — ABNORMAL HIGH (ref 0–149)

## 2018-03-15 LAB — PSA, TOTAL AND FREE
PSA, Free Pct: 16.1 %
PSA, Free: 0.29 ng/mL
Prostate Specific Ag, Serum: 1.8 ng/mL (ref 0.0–4.0)

## 2018-03-15 MED ORDER — SEMAGLUTIDE (1 MG/DOSE) 2 MG/1.5ML ~~LOC~~ SOPN: 1.0000 mg | PEN_INJECTOR | SUBCUTANEOUS | 2 refills | Status: DC

## 2018-03-16 DIAGNOSIS — M48062 Spinal stenosis, lumbar region with neurogenic claudication: Secondary | ICD-10-CM | POA: Diagnosis not present

## 2018-03-16 DIAGNOSIS — M47816 Spondylosis without myelopathy or radiculopathy, lumbar region: Secondary | ICD-10-CM | POA: Diagnosis not present

## 2018-03-25 LAB — CUP PACEART REMOTE DEVICE CHECK
Date Time Interrogation Session: 20191025023858
Implantable Pulse Generator Implant Date: 20180108

## 2018-03-28 ENCOUNTER — Encounter: Payer: Medicare Other | Admitting: *Deleted

## 2018-03-28 VITALS — BP 111/65 | HR 88 | Temp 97.6°F | Wt 293.0 lb

## 2018-03-28 DIAGNOSIS — Z006 Encounter for examination for normal comparison and control in clinical research program: Secondary | ICD-10-CM

## 2018-03-28 NOTE — Research (Signed)
Subject to research clinic for visit T6-M12 in the clear research study.  No cos, aes or saes to report.  Subject did have an endpoint of hospitalization for [place] resulting in a cath. Being treated medically.  Next phone and clinic visit scheduled.

## 2018-03-29 ENCOUNTER — Encounter

## 2018-03-29 ENCOUNTER — Ambulatory Visit: Payer: Medicare Other | Admitting: Neurology

## 2018-03-29 ENCOUNTER — Encounter: Payer: Self-pay | Admitting: Neurology

## 2018-03-29 VITALS — BP 154/82 | HR 89 | Ht 75.0 in | Wt 297.8 lb

## 2018-03-29 DIAGNOSIS — E1159 Type 2 diabetes mellitus with other circulatory complications: Secondary | ICD-10-CM

## 2018-03-29 DIAGNOSIS — E785 Hyperlipidemia, unspecified: Secondary | ICD-10-CM

## 2018-03-29 DIAGNOSIS — I480 Paroxysmal atrial fibrillation: Secondary | ICD-10-CM

## 2018-03-29 DIAGNOSIS — I6381 Other cerebral infarction due to occlusion or stenosis of small artery: Secondary | ICD-10-CM | POA: Diagnosis not present

## 2018-03-29 DIAGNOSIS — E0841 Diabetes mellitus due to underlying condition with diabetic mononeuropathy: Secondary | ICD-10-CM

## 2018-03-29 DIAGNOSIS — Z87891 Personal history of nicotine dependence: Secondary | ICD-10-CM

## 2018-03-29 DIAGNOSIS — I4891 Unspecified atrial fibrillation: Secondary | ICD-10-CM | POA: Insufficient documentation

## 2018-03-29 DIAGNOSIS — E1169 Type 2 diabetes mellitus with other specified complication: Secondary | ICD-10-CM

## 2018-03-29 DIAGNOSIS — I10 Essential (primary) hypertension: Secondary | ICD-10-CM

## 2018-03-29 DIAGNOSIS — E1142 Type 2 diabetes mellitus with diabetic polyneuropathy: Secondary | ICD-10-CM | POA: Diagnosis not present

## 2018-03-29 NOTE — Patient Instructions (Signed)

## 2018-03-29 NOTE — Progress Notes (Signed)
[facility]    Provider:  Dr Jaynee Eagles Referring Provider: [name], FNP Primary Care Physician:  [name], FNP  CC:  Lacunar infarct, occlusion of vertebral artery  Interval History 03/29/2018: [name] [name] is a 67 y.o. male here as a referral from Dr. Lenna Gilford for atherosclerotic disease. PMHx HTN, diabetes, diabetic neuropathy, HLD, depression, obesity, afib. Not smoking. He is morbidly obese. He still snores. Not wearing cpap. He is having a sleep consultation Thursday. Discussed weight loss. Discussed trying to manage his diabetes closely, hga1c 8 (goal is <7),  Discussed weight and weight loss and he declines a referral to the [facility] and [place]. Triglycerides too high to calculate LDL, he sees cardiology and is in a study for his cholesterol discussed ldl goal < 70, untreated sleep apnea. No syncopal episodes since stopping metoprolol. No new events.   reviewed images of CT 01/2018 and agree with the following:  IMPRESSION: 1. No acute intracranial process. 2. Old LEFT basal ganglia lacunar infarct, otherwise negative non-contrast CT HEAD for age.  Interval history 01/13/2017: Patient is here with his wife, he is lovely. He has good days and bad days, he has 1-2 cigarrettes every 2 weeks, he does not drink alcohol. He is on Eliquis and Plavix. Loop recorder identified afib. He has palpitations and yesterday he gets short of breath.His metoprolol was increased. He has shallow breathing and stops breathing at night.    HPI 05/24/2016:  [name] [name] is a 67 y.o. male here as a referral from Dr. Lenna Gilford for atherosclerotic disease. PMHx HTN, diabetes, diabetic neuropathy, HLD, depression, obesity. Patient is here after admission to the [facility] in early January of this year for visual field changes, blurry vision, Sharp pain behind his right eye and right-sided headache.He was admitted to telemetry for further evaluation. Carotid ultrasound  did not show any significant ICA stenosis. MRI of the brain without contrast showed chronic lacunar infarcts but nothing new. Imaging of the vessels showed atheromatous disease. EEG was normal. He did have a complete stroke workup and he is here for follow-up today. He was changed from aspirin to Plavix inpatient.  He stopped taking the Plavix. He has gone back to taking a baby aspirin. He is trying to manage his diabetes. He is on Trulicity now and working on Mudlogger with pcp. He is taking his glucose multiple times a day and adjusting insulin sliding scale. He tried a statin again and he can't tolerate it and stopped it. He is trying to eat better. He sees Administrator, arts.  He had some right sided sharp neck pain that radiated down the right arm. Since he left the hospital he can hear his heart beat in his neck radiating to the right ear where the sharp pain was (points to the carotid on the right)  Reviewed notes, labs and imaging from outside physicians, which showed:  HgbA1c 10 05/2016 Triglycerides 647 Cholesterol 239 Cannot calculat LDL  MRI brain:  IMPRESSION: 1. No acute intracranial abnormality. 2. Chronic left basal ganglia lacunar infarct.  MRA head: 1. No visible flow in the left V4 segment. See MRA neck that is reported separately. 2. High-grade atheromatous narrowings at the left ICA anterior genu, bilateral M2 branches, and bilateral P2 branches.  MRA neck : 1. Thready flow within the left vertebral artery which is new compared to 2015 imaging. Suspect a high-grade proximal left subclavian stenosis favoring steal phenomenon. Dissection is the main differential consideration. CTA  is recommended to further evaluate. 2. Cervical carotid atherosclerosis with ~ 40% proximal left ICA Stenosis.  CT Angio of the neck: 1. Left V3 segment occlusion. Distal left V4 reconstitution by the basilar with patent left PICA. 2. Diffuse narrowing and luminal  irregularity of the left vertebral artery. Correlate for neck pain as dissection or atherosclerosis and underfilling could give this appearance. 3. Mild atherosclerotic narrowing of the left subclavian ostium. No subclavian flow limiting stenosis. 4. Cervical carotid atherosclerosis with 30 to 40 % ICA narrowing on the left.   Review of Systems: Patient complains of symptoms per HPI as well as the following symptoms: Fatigue, heat intolerance, joint pain, back pain, aching muscles, muscle cramps, walking difficulty, neck pain, memory loss, dizziness, headache, numbness, speech difficulty, weakness, agitation, depression. Pertinent negatives and positives per HPI. All others negative.   Social History   Socioeconomic History  . Marital status: Married    Spouse name: Not on file  . Number of children: 2  . Years of education: Not on file  . Highest education level: Not on file  Occupational History  . Occupation: retired  Scientific laboratory technician  . Financial resource strain: Not hard at all  . Food insecurity:    Worry: Patient refused    Inability: Patient refused  . Transportation needs:    Medical: Patient refused    Non-medical: Patient refused  Tobacco Use  . Smoking status: Former Smoker    Packs/day: 0.25    Years: 51.00    Pack years: 12.75    Types: Cigarettes    Last attempt to quit: 08/14/2016    Years since quitting: 1.6  . Smokeless tobacco: Never Used  . Tobacco comment: smokes  a pack a week  Substance and Sexual Activity  . Alcohol use: No    Alcohol/week: 0.0 standard drinks  . Drug use: No  . Sexual activity: Yes  Lifestyle  . Physical activity:    Days per week: Patient refused    Minutes per session: Patient refused  . Stress: Not at all  Relationships  . Social connections:    Talks on phone: Patient refused    Gets together: Patient refused    Attends religious service: Patient refused    Active member of club or organization: Patient refused     Attends meetings of clubs or organizations: Patient refused    Relationship status: Patient refused  . Intimate partner violence:    Fear of current or ex partner: Patient refused    Emotionally abused: Patient refused    Physically abused: Patient refused    Forced sexual activity: Patient refused  Other Topics Concern  . Not on file  Social History Narrative   Lives with his wife.  Retired.  Unable to afford medicines.      Family History  Problem Relation Age of Onset  . Diabetes Father   . Valvular heart disease Father   . Arthritis Father   . Heart disease Father   . Stroke Father   . Alzheimer's disease Mother   . Hyperlipidemia Mother   . Hypertension Mother   . Arthritis Mother   . Lung cancer Mother   . Stroke Mother   . Arthritis/Rheumatoid Sister   . Diabetes Sister   . Hypertension Sister   . Hyperlipidemia Sister   . Depression Sister   . Dementia Maternal Aunt   . Dementia Maternal Uncle   . Heart disease Maternal Uncle   . Stomach cancer Paternal Uncle   .  Colon cancer Neg Hx   . Liver disease Neg Hx     Past Medical History:  Diagnosis Date  . Anxiety   . Arthritis   . CAD (coronary artery disease)    a. 2010: DES to CTO of RCA. EF 55% c. 07/2016: cath showing total occlusion within previously placed RCA stent (collaterals present), severe stenosis along LCx and OM1 (treated with 2 overlapping DES).   . Cellulitis and abscess rt groin   . Depression   . Diabetes mellitus   . Disorders of iron metabolism   . GERD (gastroesophageal reflux disease)   . Hyperlipidemia   . Hypertension   . Low serum testosterone level   . Medically noncompliant   . Myocardial infarction [facility])     Past Surgical History:  Procedure Laterality Date  . BACK SURGERY  2015   ACDF by Dr. [name]  . COLONOSCOPY N/A 10/01/2014   Procedure: COLONOSCOPY;  Surgeon: Daneil Dolin, MD;  [facility];  Service: Endoscopy;  Laterality: N/A;  815  . CORONARY STENT  INTERVENTION N/A 07/30/2016   Procedure: Coronary Stent Intervention;  Surgeon: [name], MD;  Location: [facility];  Service: Cardiovascular;  Laterality: N/A;  . CORONARY STENT PLACEMENT  2000   By Dr. Olevia Perches  . EP IMPLANTABLE DEVICE N/A 05/25/2016   Procedure: Loop Recorder Insertion;  Surgeon: [name], MD;  Location: [place] [facility];  Service: Cardiovascular;  Laterality: N/A;  . ESOPHAGOGASTRODUODENOSCOPY     esophagus stretched remotely at [facility]  . ESOPHAGOGASTRODUODENOSCOPY N/A 10/01/2014   Procedure: ESOPHAGOGASTRODUODENOSCOPY (EGD);  Surgeon: Daneil Dolin, MD;  Location: [facility];  Service: Endoscopy;  Laterality: N/A;  . HERNIA REPAIR  4696   umbilical  . LEFT HEART CATH AND CORONARY ANGIOGRAPHY N/A 07/30/2016   Procedure: Left Heart Cath and Coronary Angiography;  Surgeon: [name], MD;  Location: [place] [facility];  Service: Cardiovascular;  Laterality: N/A;  . LEFT HEART CATH AND CORONARY ANGIOGRAPHY N/A 01/19/2018   Procedure: LEFT HEART CATH AND CORONARY ANGIOGRAPHY;  Surgeon: [name], MD;  Location: [place] [facility];  Service: Cardiovascular;  Laterality: N/A;  . LESION REMOVAL     Lip and hand   . NECK SURGERY      Current Outpatient Medications  Medication Sig Dispense Refill  . DULoxetine (CYMBALTA) 30 MG capsule Take 1 capsule (30 mg total) by mouth daily. 90 capsule 1  . ELIQUIS 5 MG TABS tablet TAKE 1 TABLET BY MOUTH TWICE DAILY 180 tablet 2  . fenofibrate 160 MG tablet TAKE 1 TABLET BY MOUTH ONCE DAILY FOR CHOLESTEROL AND  TRIGLYCERIDE 90 tablet 2  . furosemide (LASIX) 20 MG tablet Take 1 tablet (20 mg total) by mouth 2 (two) times daily. 180 tablet 1  . gabapentin (NEURONTIN) 300 MG capsule Take 2 capsules (600 mg total) by mouth 3 (three) times daily. 540 capsule 1  . Insulin Degludec (TRESIBA FLEXTOUCH) 200 UNIT/ML SOPN Inject 16 Units into the skin at bedtime. 12 pen 1  . insulin lispro (HUMALOG KWIKPEN) 100 UNIT/ML  KiwkPen Inject 0.05-0.2 mLs (5-20 Units total) into the skin 3 (three) times daily with meals. Give enough for 1 month 12 pen 2  . insulin NPH-regular Human (HUMULIN 70/30) (70-30) 100 UNIT/ML injection Inject 90 Units into the skin 2 (two) times daily with a meal. 60 mL 12  . isosorbide mononitrate (IMDUR) 60 MG 24 hr tablet Take 1 tablet (60 mg total)  by mouth daily. 30 tablet 0  . Lactobacillus (PROBIOTIC ACIDOPHILUS PO) Take 1 capsule by mouth daily.    Marland Kitchen linaclotide (LINZESS) 72 MCG capsule Take 1 capsule (72 mcg total) by mouth daily before breakfast. (Patient taking differently: Take 72 mcg by mouth daily as needed. ) 30 capsule 3  . mometasone (NASONEX) 50 MCG/ACT nasal spray Place 2 sprays into the nose as needed.    . nitroGLYCERIN (NITROSTAT) 0.4 MG SL tablet Place 1 tablet (0.4 mg total) under the tongue every 5 (five) minutes x 3 doses as needed for chest pain. 25 tablet 3  . OZEMPIC, 0.25 OR 0.5 MG/DOSE, 2 MG/1.5ML SOPN INJECT 0.25 MG INTO THE SKIN ONCE A WEEK FOR 28 DAYS THEN 0.5 MG ONCE A WEEK FOR 28 DAYS  1  . pantoprazole (PROTONIX) 40 MG tablet Take 1 tablet (40 mg total) by mouth daily. 90 tablet 1  . Semaglutide, 1 MG/DOSE, (OZEMPIC, 1 MG/DOSE,) 2 MG/1.5ML SOPN Inject 1 mg into the skin once a week. 12 pen 2  . telmisartan (MICARDIS) 40 MG tablet TAKE ONE TABLET BY MOUTH ONCE DAILY (REPLACING LISINORIL) (Patient not taking: Reported on 03/29/2018) 90 tablet 1   No current facility-administered medications for this visit.     Allergies as of 03/29/2018 - Review Complete 03/29/2018  Allergen Reaction Noted  . Shellfish allergy Anaphylaxis and Other (See Comments) 09/06/2014  . Sulfa antibiotics Anaphylaxis and Rash 09/06/2014  . Ace inhibitors Other (See Comments) and Cough 08/09/2013  . Invokana [canagliflozin] Other (See Comments) 09/04/2013  . Metformin and related Itching 07/12/2013  . Pravastatin sodium Other (See Comments) 11/14/2014  . Fenofibrate Other (See Comments)  11/14/2015  . Iodine Other (See Comments) 09/07/2014  . Lisinopril Cough 10/04/2014  . Livalo [pitavastatin] Other (See Comments) 06/25/2016  . Repatha [evolocumab]  01/04/2017  . Crestor [rosuvastatin] Other (See Comments) 06/05/2013  . Horse-derived products Rash 07/25/2008  . Lexapro [escitalopram oxalate] Other (See Comments) 01/13/2016  . Lipitor [atorvastatin] Other (See Comments) 06/05/2013  . Tape Rash 07/25/2008    Vitals: BP (!) 154/82   Pulse 89   Ht 6\' 3"  (1.905 m)   Wt 297 lb 12.8 oz (135.1 kg)   BMI 37.22 kg/m  Last Weight:  Wt Readings from Last 1 Encounters:  03/29/18 297 lb 12.8 oz (135.1 kg)   Last Height:   Ht Readings from Last 1 Encounters:  03/29/18 6\' 3"  (1.905 m)    Physical exam: Exam: Gen: NAD, conversant, well nourised, morbidly obese, well groomed                     Eyes: Conjunctivae clear without exudates or hemorrhage  Neuro: Detailed Neurologic Exam  Speech:    Speech is normal; fluent and spontaneous with normal comprehension.  Cognition:    The patient is oriented to person, place, and time;  Cranial Nerves:    The pupils are equal, round, and reactive to light.Visual fields are full to finger confrontation. Extraocular movements are intact. Trigeminal sensation is intact and the muscles of mastication are normal. The face is symmetric. The palate elevates in the midline. Hearing intact. Voice is normal. Shoulder shrug is normal. The tongue has normal motion without fasciculations.   Gait:    Wide based due to extremely large body habitus  Motor Observation:    No asymmetry, no atrophy, and no involuntary movements noted. Tone:    Normal muscle tone.    Posture:    Posture is normal.  normal erect    Strength:    Strength is V/V in the upper and lower limbs.      Sensation: decreased distally in a glove/stocking distribution.     Reflex Exam:  DTR's:    Absent AJs. Deep tendon reflexes in the upper and lower extremities  are symmetrical bilaterally.     Assessment/Plan:  Mr.[name] A Groganis a 66y.o.malewith history of hypertension, uncontrolled  hyperlipidemia, diabetic neuropathy, ongoing occasional tobacco use, remote strokes, uncontrolled diabetes mellitus, afib on Eliquis, coronary artery disease, history of medical noncompliance,morbid obesity and untreated sleep apneamellituspresenting with recurrent syncope. Workup shows Cerebrovascular atherosclerotic disease. No more syncopal events since stopping metoprolol.   - He is having a sleep consultation Thursday. He doesn't want to go. Encouraged him.  - Discussed weight loss. Declines referral to [facility] and [place]  - Discussed trying to manage his diabetes closely, hga1c 8 (goal is <7),   - Discussed weight and weight loss and he declines a referral to the [facility] and [place].  - Triglycerides too high to calculate LDL, he sees cardiology and is in a study for his cholesterol discussed ldl goal < 70,  - highly recommend treating untreated sleep apnea.  - Cervical carotid atherosclerosis with 30 to 40 % ICA narrowing on the left in 2018. Recommend follow up, they decline and so I recommend discussing with cardiology he has follow up scheduled.  OSA:  Likely OSA or hypoventilation syndrome: He has shallow breathing and stops breathing at night. He has afib and palpitations. He is morbidly obese. He still snores. Needs a sleep eval.  Fatigued, memory loss, daytime somnolence: needs sleep study (see above). He nods off during the day. Gets very tired.   Afib: continue eliquis and telemisartan  Stroke an dvascular risk factors: I had a long d/w patient about his stroke risk, risk for recurrent stroke/TIAs, personally independently reviewed imaging studies and stroke evaluation results and answered questions.Continue Plavix and Eliquis for secondary stroke prevention and maintain strict control of hypertension with blood pressure  goal below 130/90, diabetes with hemoglobin A1c goal below 6.5% and lipids with LDL cholesterol goal below 70 mg/dL.Patient has h/o statin intolerance hence recommend new PCSK9 inhibitor like Praluent. Recommend he see his cardiologist MD to get prescription. I also advised the patient to eat a healthy diet with plenty of whole grains, cereals, fruits and vegetables, exercise regularly and maintain ideal body weight .Followup in the future with me in 6 months or call earlier if necessary. Recommend aggressive management of his uncontrolled vascular risk factors (cholesterol, diabetes, HTN).  With diffuse cerebrovascular disease would avoid hypotension. Discussed smoking cessation and obesity. Highly encouraged him to manage his vascular risk factors which are extensive.   At this point, neurology will sign off. Send back to pcp.   Sarina Ill, MD  [facility] [address] [place], [place] [address]  Phone [phone] Fax [phone]  A total of 25 minutes was spent face-to-face with this patient. Over half this time was spent on counseling patient on the  1. Lacunar infarction ([place])   2. Paroxysmal atrial fibrillation ([place])   3. Hypertension associated with diabetes ([place])   4. Diabetic mononeuropathy associated with diabetes mellitus due to underlying condition ([place])   5. Diabetic peripheral neuropathy associated with type 2 diabetes mellitus ([place])   6. Hyperlipidemia associated with type 2 diabetes mellitus ([place])   7. Morbid obesity ([place])   8. Stopped smoking with greater than 30  pack year history    diagnosis and different diagnostic and therapeutic options available.

## 2018-03-31 ENCOUNTER — Encounter: Payer: Self-pay | Admitting: Neurology

## 2018-03-31 ENCOUNTER — Telehealth: Payer: Self-pay | Admitting: Neurology

## 2018-03-31 ENCOUNTER — Ambulatory Visit (INDEPENDENT_AMBULATORY_CARE_PROVIDER_SITE_OTHER): Payer: Medicare Other | Admitting: Neurology

## 2018-03-31 VITALS — BP 152/74 | HR 98 | Ht 74.0 in | Wt 297.0 lb

## 2018-03-31 DIAGNOSIS — Z87891 Personal history of nicotine dependence: Secondary | ICD-10-CM

## 2018-03-31 DIAGNOSIS — R0681 Apnea, not elsewhere classified: Secondary | ICD-10-CM | POA: Diagnosis not present

## 2018-03-31 DIAGNOSIS — I480 Paroxysmal atrial fibrillation: Secondary | ICD-10-CM

## 2018-03-31 DIAGNOSIS — E1169 Type 2 diabetes mellitus with other specified complication: Secondary | ICD-10-CM | POA: Diagnosis not present

## 2018-03-31 DIAGNOSIS — E785 Hyperlipidemia, unspecified: Secondary | ICD-10-CM

## 2018-03-31 DIAGNOSIS — I6381 Other cerebral infarction due to occlusion or stenosis of small artery: Secondary | ICD-10-CM | POA: Diagnosis not present

## 2018-03-31 NOTE — Progress Notes (Addendum)
SLEEP MED[facility]INE CLINIC   Provider:  Larey Seat, MD   Primary Care Physician:  [name], FNP   Referring Provider: Dr Sarina Ill, MD   Chief Complaint  Patient presents with  . New Patient (Initial Visit)    pt with wife, rm 9. pt states that he doesnt have trouble sleeping. he states that he gets up one time to void and then sleep. he says sometimes he is tired during the day. snores in sleep. sleep study several years ago and was negative for sleep apnea. pt wife witnesses apnea events.    HPI:  [name] [name] is a 67 y.o. male patient , seen here on 03-31-2018 in a referral from Dr. Jaynee Eagles.  I have the pleasure of meeting [name] [name], a 67 year old Caucasian right-handed gentleman who presents here in the company of his wife.  I have followed Mr. Armandina Gemma sister and Mr. Altamease Oiler mother in the past.  He was originally referred by his primary care family nurse practitioner to Dr. Jaynee Eagles for a lacunar infection, a subcentimeter stroke.  His risk factors were plentiful he has hypertension, morbid obesity, diabetes with neuropathy, high cholesterol, atrial fibrillation which is considered paroxysmal, he is a non-smoker.  He snores very loudly and has apnea, as witnessed by his wife-  but was apparently not diagnosed with obstructive sleep apnea in the past, yet reportedly had a sleep study over 20 years ago. He is claustrophobic - would never consider CPAP even if he tested positive for OSA, he stated.   Chief complaint according to patient : " nothing wrong with my sleep "   Sleep habits are as follows: Supper time is between 5-7 PM, he watches TV in the evening, but he doesn't read.  He falls asleep in a recliner, which helps his chronic back pain, and has the TV running in the background.  He sleeps seated all night long, not needing any pillows, he has one nocturia. His wife retires to the bedroom alone at 77 Pm, he is always asleep by 11 pm, he sleeps always only 5-6 hours - all  his life. He rarely dreams, only when taking pain medication. No RLS. Has sometimes cramps, spasms in his left leg waking him.    Medical history: The patient endorses joint pain, cramps, aching muscles, he also endorsed depression stating he is married has children".  Easy bruising easy bleeding, weight gain fatigue leg edema tinnitus and erectile dysfunction.  Established medical diagnosis of osteoarthritis, anxiety, hypertension, coronary artery disease, ejection fraction 55 % in March 2018 catheterization showed a total occlusion of a previously RCA stent placement severe stenosis on the left circumflex and the OM1.  History of cellulitis and abscess in the right groin, diabetes mellitus, GERD, hyperlipidemia, hypertension and a history of myocardial infarction.  He had a repeat left heart catheterization and coronary angiography on 19 January 2018.  Keli.  I reviewed his medications he is chronically anticoagulated on Eliquis, he takes Cymbalta for back pain Lasix fenofibrate, Neurontin, insulin, is a little bit mononitrate for chest pain but he has it daily, probiotics, Nasonex Nitrostat.  He presented to his past visits with chronically elevated blood pressures.  Work-up had shown cerebrovascular atherosclerotic disease to be present, he had syncope while on metoprolol none since it started.  He quit smoking over a year ago.  Family sleep history:  Nobody in his family has been diagnosed with OSA, maternal cousin and aunt have OSA.   Social history:  retired , married- non smoker for 13 month following heart attack.  ETOH none since 1989,  Caffeine- coffee in AM, sometimes at night , too. Sodas 4 a day. Energy drinks. Seldomly drinks tea, only when eating out.   Review of Systems: Out of a complete 14 system review, the patient complains of only the following symptoms, and all other reviewed systems are negative. Snoring, witnessed apnea, sleeps seated, due to back.   Epworth Sleepiness score  :2/ 24  , Fatigue severity score 9/ 63  , depression score; denied  Social History   Socioeconomic History  . Marital status: Married    Spouse name: Not on file  . Number of children: 2  . Years of education: Not on file  . Highest education level: Not on file  Occupational History  . Occupation: retired  Scientific laboratory technician  . Financial resource strain: Not hard at all  . Food insecurity:    Worry: Patient refused    Inability: Patient refused  . Transportation needs:    Medical: Patient refused    Non-medical: Patient refused  Tobacco Use  . Smoking status: Former Smoker    Packs/day: 0.25    Years: 51.00    Pack years: 12.75    Types: Cigarettes    Last attempt to quit: 08/14/2016    Years since quitting: 1.6  . Smokeless tobacco: Never Used  . Tobacco comment: smokes  a pack a week  Substance and Sexual Activity  . Alcohol use: No    Alcohol/week: 0.0 standard drinks  . Drug use: No  . Sexual activity: Yes  Lifestyle  . Physical activity:    Days per week: Patient refused    Minutes per session: Patient refused  . Stress: Not at all  Relationships  . Social connections:    Talks on phone: Patient refused    Gets together: Patient refused    Attends religious service: Patient refused    Active member of club or organization: Patient refused    Attends meetings of clubs or organizations: Patient refused    Relationship status: Patient refused  . Intimate partner violence:    Fear of current or ex partner: Patient refused    Emotionally abused: Patient refused    Physically abused: Patient refused    Forced sexual activity: Patient refused  Other Topics Concern  . Not on file  Social History Narrative   Lives with his wife.  Retired.  Unable to afford medicines.      Family History  Problem Relation Age of Onset  . Diabetes Father   . Valvular heart disease Father   . Arthritis Father   . Heart disease Father   . Stroke Father   . Alzheimer's disease Mother    . Hyperlipidemia Mother   . Hypertension Mother   . Arthritis Mother   . Lung cancer Mother   . Stroke Mother   . Arthritis/Rheumatoid Sister   . Diabetes Sister   . Hypertension Sister   . Hyperlipidemia Sister   . Depression Sister   . Dementia Maternal Aunt   . Dementia Maternal Uncle   . Heart disease Maternal Uncle   . Stomach cancer Paternal Uncle   . Colon cancer Neg Hx   . Liver disease Neg Hx     Past Medical History:  Diagnosis Date  . Anxiety   . Arthritis   . CAD (coronary artery disease)    a. 2010: DES to CTO of RCA. EF 55% c.  07/2016: cath showing total occlusion within previously placed RCA stent (collaterals present), severe stenosis along LCx and OM1 (treated with 2 overlapping DES).   . Cellulitis and abscess rt groin   . Depression   . Diabetes mellitus   . Disorders of iron metabolism   . GERD (gastroesophageal reflux disease)   . Hyperlipidemia   . Hypertension   . Low serum testosterone level   . Medically noncompliant   . Myocardial infarction [facility])     Past Surgical History:  Procedure Laterality Date  . BACK SURGERY  2015   ACDF by Dr. [name]  . COLONOSCOPY N/A 10/01/2014   Procedure: COLONOSCOPY;  Surgeon: Daneil Dolin, MD;  [facility];  Service: Endoscopy;  Laterality: N/A;  815  . CORONARY STENT INTERVENTION N/A 07/30/2016   Procedure: Coronary Stent Intervention;  Surgeon: [name], MD;  Location: [place] [facility];  Service: Cardiovascular;  Laterality: N/A;  . CORONARY STENT PLACEMENT  2000   By Dr. Olevia Perches  . EP IMPLANTABLE DEVICE N/A 05/25/2016   Procedure: Loop Recorder Insertion;  Surgeon: [name], MD;  Location: [place] [facility];  Service: Cardiovascular;  Laterality: N/A;  . ESOPHAGOGASTRODUODENOSCOPY     esophagus stretched remotely at [facility]  . ESOPHAGOGASTRODUODENOSCOPY N/A 10/01/2014   Procedure: ESOPHAGOGASTRODUODENOSCOPY (EGD);  Surgeon: Daneil Dolin, MD;  [facility];  Service:  Endoscopy;  Laterality: N/A;  . HERNIA REPAIR  3295   umbilical  . LEFT HEART CATH AND CORONARY ANGIOGRAPHY N/A 07/30/2016   Procedure: Left Heart Cath and Coronary Angiography;  Surgeon: [name], MD;  Location: [place] [facility];  Service: Cardiovascular;  Laterality: N/A;  . LEFT HEART CATH AND CORONARY ANGIOGRAPHY N/A 01/19/2018   Procedure: LEFT HEART CATH AND CORONARY ANGIOGRAPHY;  Surgeon: [name], MD;  Location: [place] [facility];  Service: Cardiovascular;  Laterality: N/A;  . LESION REMOVAL     Lip and hand   . NECK SURGERY        Allergies as of 03/31/2018 - Review Complete 03/31/2018  Allergen Reaction Noted  . Shellfish allergy Anaphylaxis and Other (See Comments) 09/06/2014  . Sulfa antibiotics Anaphylaxis and Rash 09/06/2014  . Ace inhibitors Other (See Comments) and Cough 08/09/2013  . Invokana [canagliflozin] Other (See Comments) 09/04/2013  . Metformin and related Itching 07/12/2013  . Pravastatin sodium Other (See Comments) 11/14/2014  . Fenofibrate Other (See Comments) 11/14/2015  . Iodine Other (See Comments) 09/07/2014  . Lisinopril Cough 10/04/2014  . Livalo [pitavastatin] Other (See Comments) 06/25/2016  . Repatha [evolocumab]  01/04/2017  . Crestor [rosuvastatin] Other (See Comments) 06/05/2013  . Horse-derived products Rash 07/25/2008  . Lexapro [escitalopram oxalate] Other (See Comments) 01/13/2016  . Lipitor [atorvastatin] Other (See Comments) 06/05/2013  . Tape Rash 07/25/2008    Vitals: BP (!) 152/74   Pulse 98   Ht 6\' 2"  (1.88 m)   Wt 297 lb (134.7 kg)   BMI 38.13 kg/m  Last Weight:  Wt Readings from Last 1 Encounters:  03/31/18 297 lb (134.7 kg)   JOA:CZYS mass index is 38.13 kg/m.     Last Height:   Ht Readings from Last 1 Encounters:  03/31/18 6\' 2"  (1.88 m)    Physical exam:  General: The patient is awake, alert and appears not in acute distress. The patient has full Facial hair. Head: Normocephalic, atraumatic. Neck  is supple. Mallampati 5 ,  neck circumference:21.5" . Nasal airflow congested,  double seen.  Cardiovascular:  Regular rate and rhythm , without  murmurs or carotid bruit, and without distended neck veins. Respiratory: Lungs are clear to auscultation. Skin:  Without evidence of edema, or rash Trunk: BMI is 38. The patient's posture is slightly stooped.   MOCA:No flowsheet data found. MMSE: MMSE - Mini Mental State Exam 12/22/2017 12/22/2017 12/15/2016 12/11/2015 08/01/2014  Orientation to time 5 5 5 5 5   Orientation to Place 5 5 5 5 5   Registration 3 - - 3 3  Attention/ Calculation 5 - 5 5 5   Recall 3 - - 3 1  Language- name 2 objects 2 - 2 2 1   Language- repeat 1 - - 1 1  Language- follow 3 step command 3 - - 3 3  Language- read & follow direction 1 - - 1 1  Write a sentence 1 - - 1 1  Copy design 1 - - 1 1  Total score 30 - - 30 27     Attention span & concentration ability appears normal.  Speech is fluent,  without  dysarthria, dysphonia or aphasia.  Mood and affect are appropriate.  Cranial nerves: Pupils are equal and briskly reactive to light. Extraocular movements  in vertical and horizontal planes intact and without nystagmus. Visual fields by finger perimetry are intact. Hearing loss .  Facial sensation intact to fine touch.Facial motor strength is symmetric and tongue and uvula move midline. Shoulder shrug was symmetrical.   Motor exam:   Normal tone, muscle bulk and symmetric strength in all extremities. Sensory:  Fine touch, pinprick and vibration were not felt in either planta pedis,  Proprioception tested in the upper extremities was normal. Coordination: Rapid alternating movements in the fingers/hands was normal. Finger-to-nose maneuver  normal without evidence of ataxia, dysmetria or tremor. Gait and station: Patient walks without assistive device. Strength within normal limits.  Stance is stable and wide based. Toe and heel stand were deferred .Tandem gait was  deferred . Turns with 4 Steps. Romberg testing is positive -  Deep tendon reflexes: in the upper and lower extremities are symmetric and intact.    Assessment:  After physical and neurologic examination, review of laboratory studies,  Personal review of imaging studies, reports of other /same  Imaging studies, results of polysomnography and / or neurophysiology testing and pre-existing records as far as provided in visit., my assessment is :  1) Patient had a silent stroke, but he has many risk factors that would predispose him to yet another stroke, heart attack or embolic event.  1 of the key factors is his diagnosis of paroxysmal atrial fibrillation with the additional risk factors of poorly controlled hypertension, diabetes mellitus, coronary artery disease with hypercholesterolemia.   He is sleeps preferably seated not due to shortness of breath but because it helps his back pain. He has been a many decade long smoker, but denies COPD, bronchitis.  Lifelong he has never slept longer than 6 hours and he feels neither sleep deprived at this time nor that his sleep quality is poor.  He has been witnessed to snore and have apneas, but advised me that he would never wear CPAP anyway.   The patient was advised of the nature of the diagnosed disorder , the treatment options and the  risks for general health and wellness arising from not treating the condition.   I spent more than 45 minutes of face to face time with the patient.  Greater than 50% of time was spent in counseling and  coordination of care. We have discussed the diagnosis and differential and I answered the patient's questions.    Plan:  Treatment plan and additional workup :  HST -  We can discuss the results and see if there is apnea and if we have an alternative treatment, such as inspire .    PS : He advised me that he will not come for an in lab or HST, and he is ready to accept the consequences.     Larey Seat, MD  31/25/0871, 9:94 AM  Certified in Neurology by ABPN Certified in [place] by [facility] [address], [place], [place] [address]

## 2018-03-31 NOTE — Telephone Encounter (Signed)
Patient states that he has chosen not to have a sleep study. He has requested for the order to be cancelled.

## 2018-04-04 ENCOUNTER — Telehealth: Payer: Self-pay | Admitting: *Deleted

## 2018-04-04 NOTE — Telephone Encounter (Signed)
Spoke with patient regarding symptom/AF episode sent via manual transmission today. Patient reports he felt his heart fluttering and felt short of breath so he used his activator and sent a transmission. He then realized that he was probably in AF and waited for his symptoms to pass. He no longer feels that he is out of rhythm. Patient reports taking Eliquis and other cardiac meds as prescribed. Scheduled overdue 56yr f/u with Dr. Lovena Le in [place] for 06/01/18.  Discussed symptom activator use. Patient agrees to call the DC regarding symptoms if he uses activator in the future.  Advised patient that Dr. Lovena Le will review episode and we will call him back if any recommendations. He is agreeable to this plan. ECG placed in Dr. Macon Large folder for review.

## 2018-04-12 ENCOUNTER — Ambulatory Visit (INDEPENDENT_AMBULATORY_CARE_PROVIDER_SITE_OTHER): Payer: Medicare Other

## 2018-04-12 DIAGNOSIS — I639 Cerebral infarction, unspecified: Secondary | ICD-10-CM

## 2018-04-13 NOTE — Progress Notes (Signed)
Carelink Summary Report / Loop Recorder 

## 2018-04-21 ENCOUNTER — Encounter: Payer: Self-pay | Admitting: Family Medicine

## 2018-04-21 ENCOUNTER — Ambulatory Visit (INDEPENDENT_AMBULATORY_CARE_PROVIDER_SITE_OTHER): Payer: Medicare Other | Admitting: Family Medicine

## 2018-04-21 VITALS — BP 106/66 | HR 92 | Temp 97.9°F | Ht 74.0 in | Wt 294.2 lb

## 2018-04-21 DIAGNOSIS — K5732 Diverticulitis of large intestine without perforation or abscess without bleeding: Secondary | ICD-10-CM | POA: Diagnosis not present

## 2018-04-21 MED ORDER — CIPROFLOXACIN HCL 500 MG PO TABS: 500.0000 mg | ORAL_TABLET | Freq: Two times a day (BID) | ORAL | 0 refills | Status: DC

## 2018-04-21 MED ORDER — METRONIDAZOLE 500 MG PO TABS: 500.0000 mg | ORAL_TABLET | Freq: Two times a day (BID) | ORAL | 0 refills | Status: DC

## 2018-04-21 NOTE — Progress Notes (Signed)
BP 106/66   Pulse 92   Temp 97.9 F (36.6 C) (Oral)   Ht 6\' 2"  (1.88 m)   Wt 294 lb 3.2 oz (133.4 kg)   BMI 37.77 kg/m    Subjective:    Patient ID: [name] [name], male    DOB: [birth year], 67 y.o.   MRN: [id number]  HPI: [name] [name] is a 67 y.o. male presenting on 04/21/2018 for Diarrhea (x 1 week)   HPI Diarrhea abdominal pain and bloating and gas Patient is coming in today for diarrhea and abdominal pain and bloating and gas is been going on for 7 days now.  He says for example yesterday he had 6-8 episodes of diarrhea that was very watery.  He has been using a couple Imodium is here and there and they did not seem to help.  He says he did finish an antibiotic not too long ago but thinks it was at least a few weeks ago before all this developed.  He denies any blood in his rectum or fevers or chills that he is noticed.  He denies any sick contacts that he knows of.  He thinks it may have started after eating Thanksgiving dinner.  Nobody else in his family is sick from dinner.  Relevant past medical, surgical, family and social history reviewed and updated as indicated. Interim medical history since our last visit reviewed. Allergies and medications reviewed and updated.  Review of Systems  Constitutional: Negative for chills and fever.  Respiratory: Negative for shortness of breath and wheezing.   Cardiovascular: Negative for chest pain and leg swelling.  Gastrointestinal: Positive for abdominal distention, abdominal pain and diarrhea. Negative for nausea and vomiting.  Genitourinary: Negative for difficulty urinating, dysuria and frequency.  Musculoskeletal: Negative for back pain and gait problem.  Skin: Negative for rash.  All other systems reviewed and are negative.   Per HPI unless specifically indicated above   Allergies as of 04/21/2018      Reactions   Shellfish Allergy Anaphylaxis, Other (See Comments)   Tongue swelling, hives Tongue swelling, hives   Sulfa  Antibiotics Anaphylaxis, Rash   Tongue swelling, hives   Ace Inhibitors Other (See Comments), Cough   CKD, renal failure    Invokana [canagliflozin] Other (See Comments)   Syncope / dehydration   Metformin And Related Itching   Pravastatin Sodium Other (See Comments)   myalgias   Fenofibrate Other (See Comments)   Body aches   Iodine Other (See Comments)   ????   Lisinopril Cough   Livalo [pitavastatin] Other (See Comments)   Myalgias   Repatha [evolocumab]    Myalgias, flu like sx   Crestor [rosuvastatin] Other (See Comments)   Myalgias   Horse-derived Products Rash   Lexapro [escitalopram Oxalate] Other (See Comments)   Buzzing in ears,headache, felt like a zombie   Lipitor [atorvastatin] Other (See Comments)   myalgias   Tape Rash      Medication List        Accurate as of 04/21/18  2:32 PM. Always use your most recent med list.          ciprofloxacin 500 MG tablet Commonly known as:  CIPRO Take 1 tablet (500 mg total) by mouth 2 (two) times daily.   DULoxetine 30 MG capsule Commonly known as:  CYMBALTA Take 1 capsule (30 mg total) by mouth daily.   ELIQUIS 5 MG Tabs tablet Generic drug:  apixaban TAKE 1 TABLET BY MOUTH TWICE DAILY  fenofibrate 160 MG tablet TAKE 1 TABLET BY MOUTH ONCE DAILY FOR CHOLESTEROL AND  TRIGLYCERIDE   furosemide 20 MG tablet Commonly known as:  LASIX Take 1 tablet (20 mg total) by mouth 2 (two) times daily.   gabapentin 300 MG capsule Commonly known as:  NEURONTIN Take 2 capsules (600 mg total) by mouth 3 (three) times daily.   Insulin Degludec 200 UNIT/ML Sopn Inject 16 Units into the skin at bedtime.   insulin lispro 100 UNIT/ML KiwkPen Commonly known as:  HUMALOG Inject 0.05-0.2 mLs (5-20 Units total) into the skin 3 (three) times daily with meals. Give enough for 1 month   insulin NPH-regular Human (70-30) 100 UNIT/ML injection Inject 90 Units into the skin 2 (two) times daily with a meal.   isosorbide mononitrate  60 MG 24 hr tablet Commonly known as:  IMDUR Take 1 tablet (60 mg total) by mouth daily.   linaclotide 72 MCG capsule Commonly known as:  LINZESS Take 1 capsule (72 mcg total) by mouth daily before breakfast.   metroNIDAZOLE 500 MG tablet Commonly known as:  FLAGYL Take 1 tablet (500 mg total) by mouth 2 (two) times daily.   mometasone 50 MCG/ACT nasal spray Commonly known as:  NASONEX Place 2 sprays into the nose as needed.   nitroGLYCERIN 0.4 MG SL tablet Commonly known as:  NITROSTAT Place 1 tablet (0.4 mg total) under the tongue every 5 (five) minutes x 3 doses as needed for chest pain.   OZEMPIC (0.25 OR 0.5 MG/DOSE) 2 MG/1.5ML Sopn Generic drug:  Semaglutide(0.25 or 0.5MG /DOS) INJECT 0.25 MG INTO THE SKIN ONCE A WEEK FOR 28 DAYS THEN 0.5 MG ONCE A WEEK FOR 28 DAYS   Semaglutide (1 MG/DOSE) 2 MG/1.5ML Sopn Inject 1 mg into the skin once a week.   pantoprazole 40 MG tablet Commonly known as:  PROTONIX Take 1 tablet (40 mg total) by mouth daily.   PROBIOTIC ACIDOPHILUS PO Take 1 capsule by mouth daily.   telmisartan 40 MG tablet Commonly known as:  MICARDIS TAKE ONE TABLET BY MOUTH ONCE DAILY (REPLACING LISINORIL)          Objective:    BP 106/66   Pulse 92   Temp 97.9 F (36.6 C) (Oral)   Ht 6\' 2"  (1.88 m)   Wt 294 lb 3.2 oz (133.4 kg)   BMI 37.77 kg/m   Wt Readings from Last 3 Encounters:  04/21/18 294 lb 3.2 oz (133.4 kg)  03/31/18 297 lb (134.7 kg)  03/29/18 297 lb 12.8 oz (135.1 kg)    Physical Exam  Constitutional: He is oriented to person, place, and time. He appears well-developed and well-nourished. No distress.  Eyes: Conjunctivae are normal. No scleral icterus.  Cardiovascular: Normal rate, regular rhythm, normal heart sounds and intact distal pulses.  No murmur heard. Pulmonary/Chest: Effort normal and breath sounds normal. No respiratory distress. He has no wheezes.  Abdominal: Soft. Bowel sounds are normal. He exhibits distension. He  exhibits no mass. There is tenderness (Left-sided both left lower and left upper abdominal tenderness). There is no rebound and no guarding.  Musculoskeletal: Normal range of motion. He exhibits no edema.  Neurological: He is alert and oriented to person, place, and time. Coordination normal.  Skin: Skin is warm and dry. No rash noted. He is not diaphoretic.  Psychiatric: He has a normal mood and affect. His behavior is normal.  Nursing note and vitals reviewed.       Assessment & Plan:   Problem  List Items Addressed This Visit    None    Visit Diagnoses    Diverticulitis of large intestine without perforation or abscess without bleeding    -  Primary   Relevant Medications   ciprofloxacin (CIPRO) 500 MG tablet   metroNIDAZOLE (FLAGYL) 500 MG tablet      Will treat like diverticulitis because of symptoms and age and risk. Follow up plan: Return if symptoms worsen or fail to improve.  Counseling provided for all of the vaccine components No orders of the defined types were placed in this encounter.   Caryl Pina, MD [facility] 04/21/2018, 2:32 PM

## 2018-04-26 ENCOUNTER — Telehealth: Payer: Self-pay

## 2018-04-26 NOTE — Telephone Encounter (Signed)
Spoke with pt regarding his Linq alert showing increased HR. Pt states at the time of the event, he was not symptomatic and was watching television. Pt understands we will alert Dr Lovena Le for any additional recommendations. We will contact him back if any changes are made.

## 2018-05-10 ENCOUNTER — Encounter: Payer: Self-pay | Admitting: Gastroenterology

## 2018-05-10 ENCOUNTER — Ambulatory Visit: Payer: Medicare Other | Admitting: Gastroenterology

## 2018-05-10 VITALS — BP 148/87 | HR 89 | Temp 97.0°F | Ht 74.0 in | Wt 293.0 lb

## 2018-05-10 DIAGNOSIS — Z86010 Personal history of colonic polyps: Secondary | ICD-10-CM | POA: Diagnosis not present

## 2018-05-10 DIAGNOSIS — R1010 Upper abdominal pain, unspecified: Secondary | ICD-10-CM | POA: Diagnosis not present

## 2018-05-10 DIAGNOSIS — R197 Diarrhea, unspecified: Secondary | ICD-10-CM | POA: Insufficient documentation

## 2018-05-10 DIAGNOSIS — R1032 Left lower quadrant pain: Secondary | ICD-10-CM | POA: Diagnosis not present

## 2018-05-10 MED ORDER — VANCOMYCIN HCL 125 MG PO CAPS: 125.0000 mg | ORAL_CAPSULE | Freq: Four times a day (QID) | ORAL | 0 refills | Status: AC

## 2018-05-10 NOTE — Patient Instructions (Addendum)
We will treat you for possible C Diff infection but I still want you to complete stool test and turn in as soon as possible. The lab is open today and Friday.   You will start vancomycin pills, one four times daily for 10 days.   Hold Linzess for now since you are having diarrhea.   Try to hold off on pantoprazole during Cdiff treatment as you need some acid in your stomach to help fight off Cdiff. You can use OTC Pepcid AC as directed on the bottle for reflux symptoms in the meantime.   Hand-Washing Hand-washing is the No. 1 preventative measure in stopping the spread and infection of C. Diff. It is important to wash hands with warm water and anti-bacterial soap for at least 30 seconds. Alcohol-based hand sanitizers are not effective against C. Diff and will not kill spores that may be on the hands.  Isolation Any person who is infected with C. Diff should remain in a separate bedroom and ideally have a separate bathroom for at least 48 hours after they have stopped having diarrhea during treatment. When you are around a person with this infection, it is important to wear gloves and, if possible, a yellow isolation gown to prevent picking up the spores from their environment. Wash your hands immediately upon leaving their room to kill any spores that you may have accidentally picked up. Separate Hygiene Items The person infected with C. Diff should have their own hygiene items. Do not share washcloths, hand towels, toothbrushes, or linens with an infected person. Linens and clothes should be washed separately from anyone else's clothes with detergent and on the hot setting of the washer and dryer. Daily Cleaning of Affected Areas It is important to clean any areas that the infected person comes into contact with daily. Bathrooms should be cleaned after every visit. Mix 1 part of bleach with 10 parts of water as a cleaner and use on toilet bowls, sinks, sink and toilet handles and doorknobs. Vacuum  carpets daily and mop any hard floors with the bleach solution used for bathroom cleansing to kill spores that are in the environment  You are due for another colonoscopy. We will discuss this after your current GI issues are better managed.   If you develop worsening diarrhea, abdominal pain, fever please go to ED or call doctor on call. You can reach Dr. Oneida Alar by calling hospital operator at [facility] at [phone] and request to speak to Dr. Oneida Alar.

## 2018-05-10 NOTE — Progress Notes (Signed)
CC'D TO PCP °

## 2018-05-10 NOTE — Assessment & Plan Note (Addendum)
Overdue for surveillance colonoscopy. Patient reports rectal bleeding after last TCS and waking up during procedure. Would use deep sedation with propofol/use of anesthesia next time. Advised he should also report post-procedure concerns so they can be addressed. Once current GI issues are under control we will consider updating colonoscopy.   He is on Eliquis.

## 2018-05-10 NOTE — Assessment & Plan Note (Addendum)
Several week history of diarrhea with modest improvement short term on Cipro and Flagyl. Symptoms associated with flatulence/belching/bloating, more of generalized abdominal pain in both upper and lower abdomen. Most problematic symptom at this time is diarrhea as opposed to pain. Ddx includes infectious diarrhea such as Cdiff given recent hospitalization and antibiotic therapy. Partial improvement on flagyl which is no longer beneficial in some patients. Based on exam and clinical history, I doubt we are dealing with acute diverticulitis but not entirely excluded. If diarrhea doesn't improve, abd pain persists, we may need to consider CT A/P with contrast, consider premedicate with benadryl and prednisone. He did well with contrast provided for ct 2018.   Will submit stool studies for Cdiff GDH and Gi path panel as soon as possible.   Sent in RX for vancomycin 125mg  QID for 10 days. Called pharmacy to verify they had in stock.   Cdiff precautions provided. Hold PPI for now if tolerated. May use Pepcid AC.   Contact information provided how to reach on call provider over the Holidays if needed.   ER precautions provided.

## 2018-05-10 NOTE — Progress Notes (Signed)
Primary Care Physician: [name], FNP  Primary Gastroenterologist:  Garfield Cornea, MD   Chief Complaint  Patient presents with  . Abdominal Pain    comes/goes, x 1 week. Hurts all over. Saw PCP 04/21/18 and was prescribed cipro/flagyl-finished on thursday. Does not feel any better after taking these medications.   . Diarrhea    every day, several times a day- no blood in stool    HPI: [name] [name] is a 67 y.o. male with past medical history significant for CAD status post stenting, hypertension, type 2 diabetes mellitus, prior stroke, A. fib on Eliquis who presents here for next day appointment for abdominal pain and diarrhea. We last saw the patient in 2016 when he complained of abdominal pain, early satiety, bloating.  He had an EGD and colonoscopy in May 2016.  On colonoscopy he had multiple tubular adenomas removed, colonic diverticulosis.  Recommended 3-year surveillance colonoscopy.  Patient has delayed because he states he woke up during the procedure and had rectal bleeding afterwards but never contacted [place] for further instructions.  EGD showed patchy mottling/erythema and minimal polypoid appearance of the gastric mucosa, mild inflammation seen on biopsy but no H. pylori.  Patient has had multiple illnesses since we saw him in 2016.  Since then he has had stroke early 2018, MI, coronary artery stents placed (07/2016).  History of A. fib now on Eliquis.  Last hospitalization in September 2019.  Presented for chest pain rule out MI.  Repeat catheterization showed significant three-vessel coronary obstructive disease, increased medical therapy recommended.  Catheterization felt to be unchanged from prior study in March 2018 at which time he had 2 drug-eluting stents placed.  Recommended to resume Eliquis and continue Plavix. Patient states he stopped Plavix and is not on ASA 81mg  daily, I believe he did this on his own per his report.   Saw PCP on 04/21/2018 with 1 week history  of diarrhea associated with generalized abdominal pain and bloating.  Had completed an antibiotic not too long before that for dental issues.  PCP was concerned for possible diverticulitis and started on Cipro and Flagyl. Patient states antibiotics helped some but symptoms came back about a week ago. Largest symptoms is diarrhea and gas, has some abd pain in upper abdomen as well as lower abdominal discomfort like gas/rolling/need for BM. No documented fever but states his temp has been in the 98 range and generally is around 96. He has used some imodium off/on which has provided short term relief. No melena, brbpr. He has had a lot of flatulence and belching, malodorous. No vomiting. Heartburn bad last week but better now. Stools are not formed, bristol 6-7 some days more than 10 times per day. Prior to this illness, he had 1-2 bristol 4 per day.   Chronically on probiotic because "my good bacterial was wiped out". Denies submitted stool studies or having been diagnosed with Cdiff.    Patient is interested in pursuing colonoscopy in the near future once gi issues settle down only if can ensure adequate sedation. We discussed deep sedation options with propofol.   Current Outpatient Medications  Medication Sig Dispense Refill  . aspirin EC 81 MG tablet Take 81 mg by mouth daily.    . DULoxetine (CYMBALTA) 30 MG capsule Take 1 capsule (30 mg total) by mouth daily. 90 capsule 1  . ELIQUIS 5 MG TABS tablet TAKE 1 TABLET BY MOUTH TWICE DAILY 180 tablet 2  . fenofibrate 160  MG tablet TAKE 1 TABLET BY MOUTH ONCE DAILY FOR CHOLESTEROL AND  TRIGLYCERIDE 90 tablet 2  . furosemide (LASIX) 20 MG tablet Take 1 tablet (20 mg total) by mouth 2 (two) times daily. 180 tablet 1  . gabapentin (NEURONTIN) 300 MG capsule Take 2 capsules (600 mg total) by mouth 3 (three) times daily. 540 capsule 1  . Insulin Degludec (TRESIBA FLEXTOUCH) 200 UNIT/ML SOPN Inject 16 Units into the skin at bedtime. 12 pen 1  . insulin lispro  (HUMALOG KWIKPEN) 100 UNIT/ML KiwkPen Inject 0.05-0.2 mLs (5-20 Units total) into the skin 3 (three) times daily with meals. Give enough for 1 month 12 pen 2  . insulin NPH-regular Human (HUMULIN 70/30) (70-30) 100 UNIT/ML injection Inject 90 Units into the skin 2 (two) times daily with a meal. 60 mL 12  . isosorbide mononitrate (IMDUR) 60 MG 24 hr tablet Take 1 tablet (60 mg total) by mouth daily. 30 tablet 0  . Lactobacillus (PROBIOTIC ACIDOPHILUS PO) Take 1 capsule by mouth daily.    Marland Kitchen linaclotide (LINZESS) 72 MCG capsule Take 1 capsule (72 mcg total) by mouth daily before breakfast. (Patient taking differently: Take 72 mcg by mouth daily as needed. ) 30 capsule 3  . mometasone (NASONEX) 50 MCG/ACT nasal spray Place 2 sprays into the nose as needed.    . nitroGLYCERIN (NITROSTAT) 0.4 MG SL tablet Place 1 tablet (0.4 mg total) under the tongue every 5 (five) minutes x 3 doses as needed for chest pain. 25 tablet 3  . OZEMPIC, 0.25 OR 0.5 MG/DOSE, 2 MG/1.5ML SOPN INJECT 0.25 MG INTO THE SKIN ONCE A WEEK FOR 28 DAYS THEN 0.5 MG ONCE A WEEK FOR 28 DAYS  1  . pantoprazole (PROTONIX) 40 MG tablet Take 1 tablet (40 mg total) by mouth daily. 90 tablet 1  . Semaglutide, 1 MG/DOSE, (OZEMPIC, 1 MG/DOSE,) 2 MG/1.5ML SOPN Inject 1 mg into the skin once a week. 12 pen 2  . telmisartan (MICARDIS) 40 MG tablet TAKE ONE TABLET BY MOUTH ONCE DAILY (REPLACING LISINORIL) 90 tablet 1   No current facility-administered medications for this visit.     Allergies as of 05/10/2018 - Review Complete 05/10/2018  Allergen Reaction Noted  . Shellfish allergy Anaphylaxis and Other (See Comments) 09/06/2014  . Sulfa antibiotics Anaphylaxis and Rash 09/06/2014  . Ace inhibitors Other (See Comments) and Cough 08/09/2013  . Invokana [canagliflozin] Other (See Comments) 09/04/2013  . Metformin and related Itching 07/12/2013  . Pravastatin sodium Other (See Comments) 11/14/2014  . Fenofibrate Other (See Comments) 11/14/2015  .  Iodine Other (See Comments) 09/07/2014  . Lisinopril Cough 10/04/2014  . Livalo [pitavastatin] Other (See Comments) 06/25/2016  . Repatha [evolocumab]  01/04/2017  . Crestor [rosuvastatin] Other (See Comments) 06/05/2013  . Horse-derived products Rash 07/25/2008  . Lexapro [escitalopram oxalate] Other (See Comments) 01/13/2016  . Lipitor [atorvastatin] Other (See Comments) 06/05/2013  . Tape Rash 07/25/2008   Past Medical History:  Diagnosis Date  . Anxiety   . Arthritis   . CAD (coronary artery disease)    a. 2010: DES to CTO of RCA. EF 55% c. 07/2016: cath showing total occlusion within previously placed RCA stent (collaterals present), severe stenosis along LCx and OM1 (treated with 2 overlapping DES).   . Cellulitis and abscess rt groin   . Depression   . Diabetes mellitus   . Disorders of iron metabolism   . GERD (gastroesophageal reflux disease)   . Hyperlipidemia   . Hypertension   .  Low serum testosterone level   . Medically noncompliant   . Myocardial infarction [facility])    Past Surgical History:  Procedure Laterality Date  . BACK SURGERY  2015   ACDF by Dr. [name]  . COLONOSCOPY N/A 10/01/2014   Procedure: COLONOSCOPY;  Surgeon: Daneil Dolin, MD;  Location[facility];  Service: Endoscopy;  Laterality: N/A;  815  . CORONARY STENT INTERVENTION N/A 07/30/2016   Procedure: Coronary Stent Intervention;  Surgeon: [name], MD;  Location: [place] [facility] LAB;  Service: Cardiovascular;  Laterality: N/A;  . CORONARY STENT PLACEMENT  2000   By Dr. Olevia Perches  . EP IMPLANTABLE DEVICE N/A 05/25/2016   Procedure: Loop Recorder Insertion;  Surgeon: [name], MD;  Location: [place] CV LAB;  Service: Cardiovascular;  Laterality: N/A;  . ESOPHAGOGASTRODUODENOSCOPY     esophagus stretched remotely at [facility]  . ESOPHAGOGASTRODUODENOSCOPY N/A 10/01/2014   Procedure: ESOPHAGOGASTRODUODENOSCOPY (EGD);  Surgeon: Daneil Dolin, MD;  Location: [facility];  Service: Endoscopy;   Laterality: N/A;  . HERNIA REPAIR  7628   umbilical  . LEFT HEART CATH AND CORONARY ANGIOGRAPHY N/A 07/30/2016   Procedure: Left Heart Cath and Coronary Angiography;  Surgeon: [name], MD;  Location: [place] CV LAB;  Service: Cardiovascular;  Laterality: N/A;  . LEFT HEART CATH AND CORONARY ANGIOGRAPHY N/A 01/19/2018   Procedure: LEFT HEART CATH AND CORONARY ANGIOGRAPHY;  Surgeon: [name], MD;  Location: [place] CV LAB;  Service: Cardiovascular;  Laterality: N/A;  . LESION REMOVAL     Lip and hand   . NECK SURGERY     Family History  Problem Relation Age of Onset  . Diabetes Father   . Valvular heart disease Father   . Arthritis Father   . Heart disease Father   . Stroke Father   . Alzheimer's disease Mother   . Hyperlipidemia Mother   . Hypertension Mother   . Arthritis Mother   . Lung cancer Mother   . Stroke Mother   . Arthritis/Rheumatoid Sister   . Diabetes Sister   . Hypertension Sister   . Hyperlipidemia Sister   . Depression Sister   . Dementia Maternal Aunt   . Dementia Maternal Uncle   . Heart disease Maternal Uncle   . Stomach cancer Paternal Uncle   . Colon cancer Neg Hx   . Liver disease Neg Hx    Social History   Tobacco Use  . Smoking status: Former Smoker    Packs/day: 0.25    Years: 51.00    Pack years: 12.75    Types: Cigarettes    Last attempt to quit: 08/14/2016    Years since quitting: 1.7  . Smokeless tobacco: Never Used  . Tobacco comment: smokes  a pack a week  Substance Use Topics  . Alcohol use: No    Alcohol/week: 0.0 standard drinks  . Drug use: No    ROS:  General: Negative for anorexia, weight loss, fever, chills, fatigue, weakness. ENT: Negative for hoarseness, difficulty swallowing , nasal congestion. CV: Negative for chest pain, angina, palpitations, dyspnea on exertion, peripheral edema.  Respiratory: Negative for dyspnea at rest, dyspnea on exertion, cough, sputum, wheezing.  GI: See history of present  illness. GU:  Negative for dysuria, hematuria, urinary incontinence, urinary frequency, nocturnal urination.  Endo: Negative for unusual weight change.    Physical Examination:   BP (!) 148/87   Pulse 89   Temp (!) 97 F (36.1 C) (Oral)  Ht 6\' 2"  (1.88 m)   Wt 293 lb (132.9 kg)   BMI 37.62 kg/m   General: Well-nourished, well-developed in no acute distress. Accompanied by wife.  Eyes: No icterus. Mouth: Oropharyngeal mucosa moist and pink , no lesions erythema or exudate. Lungs: Clear to auscultation bilaterally.  Heart: Regular rate and rhythm, no murmurs rubs or gallops.  Abdomen: Bowel sounds are normal, obese. Mild upper abd tenderness and mild llq tenderness.  no hepatosplenomegaly or masses, no abdominal bruits or hernia , no rebound or guarding.   Extremities: No lower extremity edema. No clubbing or deformities. Neuro: Alert and oriented x 4   Skin: Warm and dry, no jaundice.   Psych: Alert and cooperative, normal mood and affect.  Labs:  Lab Results  Component Value Date   CREATININE 1.40 (H) 03/14/2018   BUN 24 03/14/2018   NA 138 03/14/2018   K 4.2 03/14/2018   CL 96 03/14/2018   CO2 23 03/14/2018   Lab Results  Component Value Date   ALT 31 03/14/2018   AST 42 (H) 03/14/2018   ALKPHOS 68 03/14/2018   BILITOT 0.5 03/14/2018   Lab Results  Component Value Date   WBC 9.7 01/25/2018   HGB 13.8 01/25/2018   HCT 41.2 01/25/2018   MCV 89 01/25/2018   PLT 347 01/25/2018   Lab Results  Component Value Date   TSH 3.093 01/19/2018   HCV Ab negative 08/2016. Imaging Studies: No results found.

## 2018-05-16 ENCOUNTER — Ambulatory Visit (INDEPENDENT_AMBULATORY_CARE_PROVIDER_SITE_OTHER): Payer: Medicare Other

## 2018-05-16 DIAGNOSIS — R55 Syncope and collapse: Secondary | ICD-10-CM

## 2018-05-16 DIAGNOSIS — R197 Diarrhea, unspecified: Secondary | ICD-10-CM | POA: Diagnosis not present

## 2018-05-16 DIAGNOSIS — Z86010 Personal history of colonic polyps: Secondary | ICD-10-CM | POA: Diagnosis not present

## 2018-05-16 DIAGNOSIS — R1010 Upper abdominal pain, unspecified: Secondary | ICD-10-CM | POA: Diagnosis not present

## 2018-05-16 DIAGNOSIS — I639 Cerebral infarction, unspecified: Secondary | ICD-10-CM

## 2018-05-16 DIAGNOSIS — R1032 Left lower quadrant pain: Secondary | ICD-10-CM | POA: Diagnosis not present

## 2018-05-16 NOTE — Progress Notes (Signed)
Carelink Summary Report / Loop Recorder 

## 2018-05-17 LAB — CUP PACEART REMOTE DEVICE CHECK
Date Time Interrogation Session: 20191230030528
Implantable Pulse Generator Implant Date: 20180108

## 2018-05-19 ENCOUNTER — Other Ambulatory Visit: Payer: Self-pay | Admitting: Internal Medicine

## 2018-05-19 ENCOUNTER — Telehealth: Payer: Self-pay | Admitting: Internal Medicine

## 2018-05-19 NOTE — Telephone Encounter (Signed)
Pt said he took a stool sample to the lab after he was seen here on 12/24 and was still having problems. He was asking about the results and if he could speak to the nurse. Please call him at [phone]

## 2018-05-20 LAB — C. DIFFICILE GDH AND TOXIN A/B
GDH ANTIGEN: NOT DETECTED
MICRO NUMBER:: 91552712
SPECIMEN QUALITY:: ADEQUATE
TOXIN A AND B: NOT DETECTED

## 2018-05-20 LAB — GASTROINTESTINAL PATHOGEN PANEL PCR
C. difficile Tox A/B, PCR: NOT DETECTED
Campylobacter, PCR: NOT DETECTED
Cryptosporidium, PCR: NOT DETECTED
E coli (ETEC) LT/ST PCR: NOT DETECTED
E coli (STEC) stx1/stx2, PCR: NOT DETECTED
E coli 0157, PCR: NOT DETECTED
Giardia lamblia, PCR: NOT DETECTED
Norovirus, PCR: NOT DETECTED
Rotavirus A, PCR: NOT DETECTED
Salmonella, PCR: NOT DETECTED
Shigella, PCR: NOT DETECTED

## 2018-05-20 NOTE — Telephone Encounter (Signed)
Lmom, waiting on a return call.  

## 2018-05-23 NOTE — Telephone Encounter (Signed)
See result note for documentation

## 2018-05-29 LAB — CUP PACEART REMOTE DEVICE CHECK
Date Time Interrogation Session: 20191127024119
Implantable Pulse Generator Implant Date: 20180108

## 2018-06-01 ENCOUNTER — Ambulatory Visit: Payer: Medicare Other | Admitting: Internal Medicine

## 2018-06-01 ENCOUNTER — Encounter: Payer: Self-pay | Admitting: Internal Medicine

## 2018-06-01 VITALS — BP 132/78 | HR 94 | Ht 74.0 in | Wt 291.0 lb

## 2018-06-01 DIAGNOSIS — I480 Paroxysmal atrial fibrillation: Secondary | ICD-10-CM

## 2018-06-01 DIAGNOSIS — I639 Cerebral infarction, unspecified: Secondary | ICD-10-CM

## 2018-06-01 LAB — CUP PACEART INCLINIC DEVICE CHECK
Date Time Interrogation Session: 20200115122627
Implantable Pulse Generator Implant Date: 20180108

## 2018-06-01 MED ORDER — METOPROLOL TARTRATE 50 MG PO TABS: ORAL_TABLET | ORAL | 3 refills | Status: DC

## 2018-06-01 NOTE — Progress Notes (Signed)
HPI Mr. Mounts returns today for ongoing followup of PAF, CAD, and syncope. He sustained an MI in March and underwent stenting of the RCA. He had preserved LV function. He has been bothered by knee and back pain. He is a bit depressed by his many medical problems. He feels palpitations. No syncope. He has had a cryptogenic stroke and subsequent ILR demonstrated PAF and he has been placed on systemic anti-coagulation.  His main complaint is back pain.  Allergies  Allergen Reactions  . Shellfish Allergy Anaphylaxis and Other (See Comments)    Tongue swelling, hives Tongue swelling, hives  . Sulfa Antibiotics Anaphylaxis and Rash    Tongue swelling, hives  . Ace Inhibitors Other (See Comments) and Cough    CKD, renal failure   . Invokana [Canagliflozin] Other (See Comments)    Syncope / dehydration  . Metformin And Related Itching  . Pravastatin Sodium Other (See Comments)    myalgias  . Fenofibrate Other (See Comments)    Body aches  . Iodine Other (See Comments)    ????  . Lisinopril Cough  . Livalo [Pitavastatin] Other (See Comments)    Myalgias   . Repatha [Evolocumab]     Myalgias, flu like sx  . Crestor [Rosuvastatin] Other (See Comments)    Myalgias   . Horse-Derived Products Rash  . Lexapro [Escitalopram Oxalate] Other (See Comments)    Buzzing in ears,headache, felt like a zombie  . Lipitor [Atorvastatin] Other (See Comments)    myalgias  . Tape Rash     Current Outpatient Medications  Medication Sig Dispense Refill  . aspirin EC 81 MG tablet Take 81 mg by mouth daily.    . DULoxetine (CYMBALTA) 30 MG capsule Take 1 capsule (30 mg total) by mouth daily. 90 capsule 1  . ELIQUIS 5 MG TABS tablet TAKE 1 TABLET BY MOUTH TWICE DAILY 180 tablet 2  . fenofibrate 160 MG tablet TAKE 1 TABLET BY MOUTH ONCE DAILY FOR CHOLESTEROL AND  TRIGLYCERIDE 90 tablet 2  . furosemide (LASIX) 20 MG tablet Take 1 tablet (20 mg total) by mouth 2 (two) times daily. 180 tablet 1  .  gabapentin (NEURONTIN) 300 MG capsule Take 2 capsules (600 mg total) by mouth 3 (three) times daily. 540 capsule 1  . Insulin Degludec (TRESIBA FLEXTOUCH) 200 UNIT/ML SOPN Inject 16 Units into the skin at bedtime. 12 pen 1  . insulin lispro (HUMALOG KWIKPEN) 100 UNIT/ML KiwkPen Inject 0.05-0.2 mLs (5-20 Units total) into the skin 3 (three) times daily with meals. Give enough for 1 month 12 pen 2  . insulin NPH-regular Human (HUMULIN 70/30) (70-30) 100 UNIT/ML injection Inject 90 Units into the skin 2 (two) times daily with a meal. 60 mL 12  . isosorbide mononitrate (IMDUR) 60 MG 24 hr tablet Take 1 tablet (60 mg total) by mouth daily. 30 tablet 0  . Lactobacillus (PROBIOTIC ACIDOPHILUS PO) Take 1 capsule by mouth daily.    Marland Kitchen linaclotide (LINZESS) 72 MCG capsule Take 1 capsule (72 mcg total) by mouth daily before breakfast. (Patient taking differently: Take 72 mcg by mouth daily as needed. ) 30 capsule 3  . mometasone (NASONEX) 50 MCG/ACT nasal spray Place 2 sprays into the nose as needed.    . nitroGLYCERIN (NITROSTAT) 0.4 MG SL tablet Place 1 tablet (0.4 mg total) under the tongue every 5 (five) minutes x 3 doses as needed for chest pain. 25 tablet 3  . OZEMPIC, 0.25 OR 0.5 MG/DOSE, 2  MG/1.5ML SOPN INJECT 0.25 MG INTO THE SKIN ONCE A WEEK FOR 28 DAYS THEN 0.5 MG ONCE A WEEK FOR 28 DAYS  1  . pantoprazole (PROTONIX) 40 MG tablet Take 1 tablet (40 mg total) by mouth daily. 90 tablet 1  . Semaglutide, 1 MG/DOSE, (OZEMPIC, 1 MG/DOSE,) 2 MG/1.5ML SOPN Inject 1 mg into the skin once a week. 12 pen 2  . telmisartan (MICARDIS) 40 MG tablet TAKE ONE TABLET BY MOUTH ONCE DAILY (REPLACING LISINORIL) 90 tablet 1   No current facility-administered medications for this visit.      Past Medical History:  Diagnosis Date  . Anxiety   . Arthritis   . CAD (coronary artery disease)    a. 2010: DES to CTO of RCA. EF 55% c. 07/2016: cath showing total occlusion within previously placed RCA stent (collaterals  present), severe stenosis along LCx and OM1 (treated with 2 overlapping DES).   . Cellulitis and abscess rt groin   . Depression   . Diabetes mellitus   . Disorders of iron metabolism   . GERD (gastroesophageal reflux disease)   . Hyperlipidemia   . Hypertension   . Low serum testosterone level   . Medically noncompliant   . Myocardial infarction ([place])     ROS:   All systems reviewed and negative except as noted in the HPI.   Past Surgical History:  Procedure Laterality Date  . BACK SURGERY  2015   ACDF by Dr. [name]  . COLONOSCOPY N/A 10/01/2014   Dr. [name]: multiple tubular adenomas removed, colonic diverticulosis, redundant colon. next tcs advised for 09/2017. PATIENT NEEDS PROPOFOL FOR FAILED CONSCIOUS SEDATION  . CORONARY STENT INTERVENTION N/A 07/30/2016   Procedure: Coronary Stent Intervention;  Surgeon: [name], MD;  Location: [facility];  Service: Cardiovascular;  Laterality: N/A;  . CORONARY STENT PLACEMENT  2000   By Dr. Olevia Perches  . EP IMPLANTABLE DEVICE N/A 05/25/2016   Procedure: Loop Recorder Insertion;  Surgeon: [name], MD;  Location: [facility] [facility];  Service: Cardiovascular;  Laterality: N/A;  . ESOPHAGOGASTRODUODENOSCOPY     esophagus stretched remotely at [facility]  . ESOPHAGOGASTRODUODENOSCOPY N/A 10/01/2014   Dr. [name]: patchy mottling/erythema and minimal polypoid appearance of gastric mucosa. bx with mild inlammation but no H.pylori  . HERNIA REPAIR  [id number]   umbilical  . LEFT HEART CATH AND CORONARY ANGIOGRAPHY N/A 07/30/2016   Procedure: Left Heart Cath and Coronary Angiography;  Surgeon: [name], MD;  Location: [place] [facility];  Service: Cardiovascular;  Laterality: N/A;  . LEFT HEART CATH AND CORONARY ANGIOGRAPHY N/A 01/19/2018   Procedure: LEFT HEART CATH AND CORONARY ANGIOGRAPHY;  Surgeon: [name], MD;  Location: [place] [facility];  Service: Cardiovascular;  Laterality: N/A;  . LESION REMOVAL     Lip and hand   . NECK  SURGERY       Family History  Problem Relation Age of Onset  . Diabetes Father   . Valvular heart disease Father   . Arthritis Father   . Heart disease Father   . Stroke Father   . Alzheimer's disease Mother   . Hyperlipidemia Mother   . Hypertension Mother   . Arthritis Mother   . Lung cancer Mother   . Stroke Mother   . Arthritis/Rheumatoid Sister   . Diabetes Sister   . Hypertension Sister   . Hyperlipidemia Sister   . Depression Sister   . Dementia Maternal Aunt   . Dementia Maternal  Uncle   . Heart disease Maternal Uncle   . Stomach cancer Paternal Uncle   . Colon cancer Neg Hx   . Liver disease Neg Hx      Social History   Socioeconomic History  . Marital status: Married    Spouse name: Not on file  . Number of children: 2  . Years of education: Not on file  . Highest education level: Not on file  Occupational History  . Occupation: retired  Scientific laboratory technician  . Financial resource strain: Not hard at all  . Food insecurity:    Worry: Patient refused    Inability: Patient refused  . Transportation needs:    Medical: Patient refused    Non-medical: Patient refused  Tobacco Use  . Smoking status: Former Smoker    Packs/day: 0.25    Years: 51.00    Pack years: 12.75    Types: Cigarettes    Last attempt to quit: 08/14/2016    Years since quitting: 1.7  . Smokeless tobacco: Never Used  . Tobacco comment: smokes  a pack a week  Substance and Sexual Activity  . Alcohol use: No    Alcohol/week: 0.0 standard drinks  . Drug use: No  . Sexual activity: Yes  Lifestyle  . Physical activity:    Days per week: Patient refused    Minutes per session: Patient refused  . Stress: Not at all  Relationships  . Social connections:    Talks on phone: Patient refused    Gets together: Patient refused    Attends religious service: Patient refused    Active member of club or organization: Patient refused    Attends meetings of clubs or organizations: Patient refused     Relationship status: Patient refused  . Intimate partner violence:    Fear of current or ex partner: Patient refused    Emotionally abused: Patient refused    Physically abused: Patient refused    Forced sexual activity: Patient refused  Other Topics Concern  . Not on file  Social History Narrative   Lives with his wife.  Retired.  Unable to afford medicines.       BP 132/78   Pulse 94   Ht 6\' 2"  (1.88 m)   Wt 291 lb (132 kg)   SpO2 97%   BMI 37.36 kg/m   Physical Exam:  Well appearing overweight man, NAD HEENT: Unremarkable Neck:  6 cm JVD, no thyromegally Lymphatics:  No adenopathy Back:  No CVA tenderness Lungs:  Clear with no wheezes HEART:  Regular rate rhythm, no murmurs, no rubs, no clicks Abd:  soft, positive bowel sounds, no organomegally, no rebound, no guarding Ext:  2 plus pulses, no edema, no cyanosis, no clubbing Skin:  No rashes no nodules Neuro:  CN II through XII intact, motor grossly intact   DEVICE  Normal device function.  See PaceArt for details.   Assess/Plan: 1. PAF - he is maintaining NSR mostly. He has had some symptomatic break throughs of his atrial fib and I have recommended metoprolol tartrate 50 mg as needed. 2. Syncope - he had an episode about 2 weeks ago with no brady or tachy on his ILR. 3. Obesity - he is encouraged to lose weight. 4. Back pain - he is pending followup with Dr. [name]. He thinks that this is his main problem.   Mikle Bosworth.D.

## 2018-06-01 NOTE — Patient Instructions (Signed)
Medication Instructions:  Your physician has recommended you make the following change in your medication:  Start Taking Lopressor 50 mg Daily for increaed heart rate or palpitations   If you need a refill on your cardiac medications before your next appointment, please call your pharmacy.   Lab work: NONE  If you have labs (blood work) drawn today and your tests are completely normal, you will receive your results only by: Marland Kitchen MyChart Message (if you have MyChart) OR . A paper copy in the mail If you have any lab test that is abnormal or we need to change your treatment, we will call you to review the results.  Testing/Procedures: NONE   Follow-Up: At [facility], you and your health needs are our priority.  As part of our continuing mission to provide you with exceptional heart care, we have created designated Provider Care Teams.  These Care Teams include your primary Cardiologist (physician) and Advanced Practice Providers (APPs -  Physician Assistants and Nurse Practitioners) who all work together to provide you with the care you need, when you need it. You will need a follow up appointment in 1 years.  Please call our office 2 months in advance to schedule this appointment.  You may see Dr. Lovena Le or one of the following Advanced Practice Providers on your designated Care Team:   Chanetta Marshall, NP . Tommye Standard, PA-C  Any Other Special Instructions Will Be Listed Below (If Applicable). Thank you for choosing [facility]!

## 2018-06-02 ENCOUNTER — Other Ambulatory Visit: Payer: Self-pay | Admitting: Family

## 2018-06-02 DIAGNOSIS — K219 Gastro-esophageal reflux disease without esophagitis: Secondary | ICD-10-CM

## 2018-06-09 ENCOUNTER — Other Ambulatory Visit: Payer: Self-pay | Admitting: Family Medicine

## 2018-06-09 DIAGNOSIS — E1142 Type 2 diabetes mellitus with diabetic polyneuropathy: Secondary | ICD-10-CM

## 2018-06-15 ENCOUNTER — Other Ambulatory Visit: Payer: Self-pay

## 2018-06-15 ENCOUNTER — Telehealth: Payer: Self-pay | Admitting: *Deleted

## 2018-06-15 ENCOUNTER — Other Ambulatory Visit: Payer: Self-pay | Admitting: *Deleted

## 2018-06-15 DIAGNOSIS — R140 Abdominal distension (gaseous): Secondary | ICD-10-CM

## 2018-06-15 DIAGNOSIS — R109 Unspecified abdominal pain: Secondary | ICD-10-CM

## 2018-06-15 DIAGNOSIS — R1084 Generalized abdominal pain: Secondary | ICD-10-CM

## 2018-06-15 DIAGNOSIS — R197 Diarrhea, unspecified: Secondary | ICD-10-CM

## 2018-06-15 MED ORDER — PREDNISONE 50 MG PO TABS: 50.0000 mg | ORAL_TABLET | ORAL | 0 refills | Status: DC

## 2018-06-15 NOTE — Telephone Encounter (Signed)
Noted. Orders have been faxed to [facility] for lab work. Pt is aware and knows that prednisone 50 mg has been sent to his pharmacy. He is to take Prednisone 50 mg 13h, 7h and 1 h prior to his CT and he will buy otc Benadryl and take 50 mg 1 hour prior to CT.

## 2018-06-15 NOTE — Telephone Encounter (Signed)
Patient scheduled for CT a/p on 06/17/2018 at 4:00pm, arrival time 3:45pm, npo 4 hours prior, p/u oral contrast tomorrow with instructions.  Patient aware of appt details. He voiced understanding. He wants to have lab work done while he is at the hospital tomorrow. I made alicia aware. She also stated she will send in prednisone for pt.   [place] website and no PA is required for CT a/p

## 2018-06-16 ENCOUNTER — Telehealth: Payer: Self-pay

## 2018-06-16 ENCOUNTER — Other Ambulatory Visit (HOSPITAL_COMMUNITY)
Admission: RE | Admit: 2018-06-16 | Discharge: 2018-06-16 | Disposition: A | Discharge status: Home / Self Care | Payer: Medicare Other | Source: Ambulatory Visit | Attending: Gastroenterology | Admitting: Gastroenterology

## 2018-06-16 ENCOUNTER — Other Ambulatory Visit: Payer: Self-pay | Admitting: Cardiology

## 2018-06-16 ENCOUNTER — Other Ambulatory Visit: Payer: Self-pay | Admitting: Internal Medicine

## 2018-06-16 DIAGNOSIS — R109 Unspecified abdominal pain: Secondary | ICD-10-CM | POA: Insufficient documentation

## 2018-06-16 DIAGNOSIS — I480 Paroxysmal atrial fibrillation: Secondary | ICD-10-CM

## 2018-06-16 LAB — BASIC METABOLIC PANEL
Anion gap: 13 (ref 5–15)
BUN: 31 mg/dL — ABNORMAL HIGH (ref 8–23)
CO2: 28 mmol/L (ref 22–32)
Calcium: 10.6 mg/dL — ABNORMAL HIGH (ref 8.9–10.3)
Chloride: 94 mmol/L — ABNORMAL LOW (ref 98–111)
Creatinine, Ser: 1.39 mg/dL — ABNORMAL HIGH (ref 0.61–1.24)
GFR calc Af Amer: >60 mL/min (ref >60)
GFR calc non Af Amer: 52 mL/min — ABNORMAL LOW (ref >60)
Glucose, Bld: 157 mg/dL — ABNORMAL HIGH (ref 70–99)
Potassium: 4 mmol/L (ref 3.5–5.1)
Sodium: 135 mmol/L (ref 135–145)

## 2018-06-16 LAB — CBC WITH DIFFERENTIAL/PLATELET
Abs Immature Granulocytes: 0.07 10*3/uL (ref 0.00–0.07)
Basophils Absolute: 0.1 10*3/uL (ref 0.0–0.1)
Basophils Relative: 1 %
Eosinophils Absolute: 0.3 10*3/uL (ref 0.0–0.5)
Eosinophils Relative: 2 %
HCT: 44.9 % (ref 39.0–52.0)
Hemoglobin: 14.2 g/dL (ref 13.0–17.0)
Immature Granulocytes: 1 %
Lymphocytes Relative: 19 %
Lymphs Abs: 1.9 10*3/uL (ref 0.7–4.0)
MCH: 28.3 pg (ref 26.0–34.0)
MCHC: 31.6 g/dL (ref 30.0–36.0)
MCV: 89.4 fL (ref 80.0–100.0)
Monocytes Absolute: 0.8 10*3/uL (ref 0.1–1.0)
Monocytes Relative: 8 %
Neutro Abs: 7.1 10*3/uL (ref 1.7–7.7)
Neutrophils Relative %: 69 %
Platelets: 316 10*3/uL (ref 150–400)
RBC: 5.02 MIL/uL (ref 4.22–5.81)
RDW: 13.4 % (ref 11.5–15.5)
WBC: 10.2 10*3/uL (ref 4.0–10.5)
nRBC: 0 % (ref 0.0–0.2)

## 2018-06-16 NOTE — Telephone Encounter (Signed)
Received a call from [facility] ([facility]), pt walked in this morning 06/16/2018 to have his labs done. They only drew a CBC and asked if pt needed the other labs done. My reply was yes he does. Caryl Pina asked if pt may be able to have them done at a lab closer to him since he lives in [place]. Please advise.

## 2018-06-16 NOTE — Telephone Encounter (Signed)
noted 

## 2018-06-16 NOTE — Telephone Encounter (Signed)
Thank you :)

## 2018-06-16 NOTE — Telephone Encounter (Signed)
Called back to [facility] after discussing with LSL. Pt will need labs drawn prior to his CT tomorrow 06/17/2018. Caryl Pina is aware and aked if I've talked to the pt. I informed Caryl Pina that I've only talked to LSL and pt needs to come back to the lab. I asked if she could call the pt and ask him to come back. Caryl Pina said I can call the pt or she can. I asked Caryl Pina to call pt back since the lab didn't draw the labs needed this morning.

## 2018-06-17 ENCOUNTER — Ambulatory Visit (INDEPENDENT_AMBULATORY_CARE_PROVIDER_SITE_OTHER): Payer: Medicare Other

## 2018-06-17 ENCOUNTER — Ambulatory Visit (HOSPITAL_COMMUNITY)
Admission: RE | Admit: 2018-06-17 | Discharge: 2018-06-17 | Disposition: A | Discharge status: Home / Self Care | Payer: Medicare Other | Source: Ambulatory Visit | Attending: Gastroenterology | Admitting: Gastroenterology

## 2018-06-17 DIAGNOSIS — I639 Cerebral infarction, unspecified: Secondary | ICD-10-CM | POA: Diagnosis not present

## 2018-06-17 DIAGNOSIS — R140 Abdominal distension (gaseous): Secondary | ICD-10-CM

## 2018-06-17 DIAGNOSIS — R197 Diarrhea, unspecified: Secondary | ICD-10-CM

## 2018-06-17 DIAGNOSIS — R1084 Generalized abdominal pain: Secondary | ICD-10-CM

## 2018-06-17 MED ORDER — BARIUM SULFATE 2.1 % PO SUSP
ORAL | Status: AC
  Filled 2018-06-17: qty 2

## 2018-06-20 DIAGNOSIS — M47816 Spondylosis without myelopathy or radiculopathy, lumbar region: Secondary | ICD-10-CM | POA: Diagnosis not present

## 2018-06-20 DIAGNOSIS — M48062 Spinal stenosis, lumbar region with neurogenic claudication: Secondary | ICD-10-CM | POA: Diagnosis not present

## 2018-06-21 ENCOUNTER — Ambulatory Visit (HOSPITAL_COMMUNITY)
Admission: RE | Admit: 2018-06-21 | Discharge: 2018-06-21 | Disposition: A | Discharge status: Home / Self Care | Payer: Medicare Other | Source: Ambulatory Visit | Attending: Gastroenterology | Admitting: Gastroenterology

## 2018-06-21 DIAGNOSIS — R140 Abdominal distension (gaseous): Secondary | ICD-10-CM | POA: Diagnosis not present

## 2018-06-21 DIAGNOSIS — R197 Diarrhea, unspecified: Secondary | ICD-10-CM | POA: Insufficient documentation

## 2018-06-21 DIAGNOSIS — R1084 Generalized abdominal pain: Secondary | ICD-10-CM | POA: Diagnosis not present

## 2018-06-21 DIAGNOSIS — K4090 Unilateral inguinal hernia, without obstruction or gangrene, not specified as recurrent: Secondary | ICD-10-CM | POA: Diagnosis not present

## 2018-06-21 MED ORDER — IOHEXOL 300 MG/ML  SOLN
100.0000 mL | Freq: Once | INTRAMUSCULAR | Status: AC | PRN
  Administered 2018-06-21: 100 mL via INTRAVENOUS

## 2018-06-22 LAB — CUP PACEART REMOTE DEVICE CHECK
Date Time Interrogation Session: 20200201092058
Implantable Pulse Generator Implant Date: 20180108

## 2018-06-23 ENCOUNTER — Other Ambulatory Visit: Payer: Self-pay | Admitting: Family

## 2018-06-24 NOTE — Progress Notes (Signed)
Carelink Summary Report / Loop Recorder 

## 2018-06-28 ENCOUNTER — Telehealth: Payer: Self-pay | Admitting: Internal Medicine

## 2018-06-28 ENCOUNTER — Other Ambulatory Visit: Payer: Self-pay | Admitting: Family

## 2018-06-28 NOTE — Telephone Encounter (Signed)
[phone] PATIENT WIFE CALLED INQUIRING ABOUT CT RESULTS.

## 2018-06-28 NOTE — Telephone Encounter (Signed)
Pt is inquiring about his CT results.  

## 2018-06-29 NOTE — Telephone Encounter (Signed)
SEE RESULT NOTE 

## 2018-06-29 NOTE — Telephone Encounter (Signed)
Noted. See results note for documentation.

## 2018-07-01 ENCOUNTER — Other Ambulatory Visit (HOSPITAL_COMMUNITY): Payer: Self-pay

## 2018-07-05 ENCOUNTER — Ambulatory Visit: Payer: Medicare Other | Admitting: Internal Medicine

## 2018-07-05 ENCOUNTER — Telehealth: Payer: Self-pay

## 2018-07-05 ENCOUNTER — Other Ambulatory Visit: Payer: Self-pay

## 2018-07-05 ENCOUNTER — Encounter: Payer: Self-pay | Admitting: Internal Medicine

## 2018-07-05 VITALS — BP 169/76 | HR 91 | Temp 97.5°F | Ht 75.0 in | Wt 292.0 lb

## 2018-07-05 DIAGNOSIS — Z86010 Personal history of colonic polyps: Secondary | ICD-10-CM | POA: Diagnosis not present

## 2018-07-05 MED ORDER — CLENPIQ 10-3.5-12 MG-GM -GM/160ML PO SOLN: 1.0000 | Freq: Once | ORAL | 0 refills | Status: AC

## 2018-07-05 NOTE — Patient Instructions (Signed)
  Schedule a surveillance colonoscopy  (hx of colon polyps) propofol  Stop Eliquis x 3 days prior to procedure  Decrease 70/30 insulin to 50 units twice the day before the procedure  Humalog 5 units before each meal day before procedure  Decrease Tresiba to 10 units at bedtime night before procedure  Further recommendations to follow

## 2018-07-05 NOTE — Progress Notes (Signed)
Primary Care Physician:  [name], FNP Primary Gastroenterologist:  Dr. [name]  Pre-Procedure History & Physical: HPI:  [name] [name] is a 68 y.o. male here for follow-up of diarrheal illness.  Patient was treated with Cipro and Flagyl empirically for an infectious bacterial colitis.  Subsequently, we saw [place] and we treated him for C. difficile with vancomycin.  C. difficile came back negative.  Diarrhea has resolved.  History of multiple colonic adenomas removed previously; he is due for a surveillance examination at this time. He takes Eliquis.  He is on multiple meds for control of his diabetes.  Past Medical History:  Diagnosis Date  . Anxiety   . Arthritis   . CAD (coronary artery disease)    a. 2010: DES to CTO of RCA. EF 55% c. 07/2016: cath showing total occlusion within previously placed RCA stent (collaterals present), severe stenosis along LCx and OM1 (treated with 2 overlapping DES).   . Cellulitis and abscess rt groin   . Depression   . Diabetes mellitus   . Disorders of iron metabolism   . GERD (gastroesophageal reflux disease)   . Hyperlipidemia   . Hypertension   . Low serum testosterone level   . Medically noncompliant   . Myocardial infarction [facility])     Past Surgical History:  Procedure Laterality Date  . BACK SURGERY  2015   ACDF by Dr. [name]  . COLONOSCOPY N/A 10/01/2014   Dr. [name]: multiple tubular adenomas removed, colonic diverticulosis, redundant colon. next tcs advised for 09/2017. PATIENT NEEDS PROPOFOL FOR FAILED CONSCIOUS SEDATION  . CORONARY STENT INTERVENTION N/A 07/30/2016   Procedure: Coronary Stent Intervention;  Surgeon: [name], MD;  Location: [place] [facility];  Service: Cardiovascular;  Laterality: N/A;  . CORONARY STENT PLACEMENT  2000   By Dr. Olevia Perches  . EP IMPLANTABLE DEVICE N/A 05/25/2016   Procedure: Loop Recorder Insertion;  Surgeon: [name], MD;  Location: [place] [facility];  Service: Cardiovascular;  Laterality: N/A;   . ESOPHAGOGASTRODUODENOSCOPY     esophagus stretched remotely at [facility]  . ESOPHAGOGASTRODUODENOSCOPY N/A 10/01/2014   Dr. [name]: patchy mottling/erythema and minimal polypoid appearance of gastric mucosa. bx with mild inlammation but no H.pylori  . HERNIA REPAIR  6568   umbilical  . LEFT HEART CATH AND CORONARY ANGIOGRAPHY N/A 07/30/2016   Procedure: Left Heart Cath and Coronary Angiography;  Surgeon: [name], MD;  Location: [place] [facility];  Service: Cardiovascular;  Laterality: N/A;  . LEFT HEART CATH AND CORONARY ANGIOGRAPHY N/A 01/19/2018   Procedure: LEFT HEART CATH AND CORONARY ANGIOGRAPHY;  Surgeon: [name], MD;  Location: [place] [facility];  Service: Cardiovascular;  Laterality: N/A;  . LESION REMOVAL     Lip and hand   . NECK SURGERY      Prior to Admission medications   Medication Sig Start Date End Date Taking? Authorizing Provider  aspirin EC 81 MG tablet Take 81 mg by mouth daily.   Yes [provider]  DULoxetine (CYMBALTA) 30 MG capsule Take 1 capsule (30 mg total) by mouth daily. 12/10/17  Yes Hawks, Christy A, FNP  ELIQUIS 5 MG TABS tablet TAKE 1 TABLET BY MOUTH TWICE DAILY 06/16/18  Yes Branch, Alphonse Guild, MD  fenofibrate 160 MG tablet TAKE 1 TABLET BY MOUTH ONCE DAILY FOR CHOLESTEROL AND  TRIGLYCERIDE 12/10/17  Yes Hawks, Christy A, FNP  furosemide (LASIX) 20 MG tablet Take 1 tablet (20 mg total) by mouth 2 (  two) times daily. 02/28/18  Yes Hawks, Christy A, FNP  gabapentin (NEURONTIN) 300 MG capsule Take 2 capsules (600 mg total) by mouth 3 (three) times daily. 03/14/18  Yes Hawks, Christy A, FNP  HUMALOG KWIKPEN 100 UNIT/ML KwikPen INJECT 5-20 UNITS INTO THE SKIN THREE TIMES DAILY WITH MEALS 06/10/18  Yes Hawks, Christy A, FNP  Insulin Degludec (TRESIBA FLEXTOUCH) 200 UNIT/ML SOPN Inject 16 Units into the skin at bedtime. 12/14/17  Yes Hawks, Christy A, FNP  insulin NPH-regular Human (HUMULIN 70/30) (70-30) 100 UNIT/ML injection Inject 90 Units into  the skin 2 (two) times daily with a meal. 11/09/17  Yes Hawks, Christy A, FNP  isosorbide mononitrate (IMDUR) 60 MG 24 hr tablet Take 1 tablet (60 mg total) by mouth daily. 01/21/18  Yes Black, Lezlie Octave, NP  Lactobacillus (PROBIOTIC ACIDOPHILUS PO) Take 1 capsule by mouth daily.   Yes [provider]  linaclotide (LINZESS) 72 MCG capsule Take 1 capsule (72 mcg total) by mouth daily before breakfast. Patient taking differently: Take 72 mcg by mouth daily as needed.  08/26/16  Yes Hawks, Alyse Low A, FNP  metoprolol tartrate (LOPRESSOR) 50 MG tablet Take one tablet as needed for heart palpitations and increased Heart Rate 06/01/18  Yes [name], MD  mometasone (NASONEX) 50 MCG/ACT nasal spray Place 2 sprays into the nose as needed.   Yes [provider]  nitroGLYCERIN (NITROSTAT) 0.4 MG SL tablet Place 1 tablet (0.4 mg total) under the tongue every 5 (five) minutes x 3 doses as needed for chest pain. 09/06/17  Yes Strader, [place], PA-C  OZEMPIC, 0.25 OR 0.5 MG/DOSE, 2 MG/1.5ML SOPN INJECT 0.25 MG INTO THE SKIN ONCE A WEEK FOR 28 DAYS THEN 0.5 MG ONCE A WEEK FOR 28 DAYS 02/11/18  Yes [provider]  pantoprazole (PROTONIX) 40 MG tablet TAKE 1 TABLET BY MOUTH ONCE DAILY 06/02/18  Yes Hawks, Christy A, FNP  Semaglutide, 1 MG/DOSE, (OZEMPIC, 1 MG/DOSE,) 2 MG/1.5ML SOPN Inject 1 mg into the skin once a week. 03/15/18  Yes Hawks, Christy A, FNP  telmisartan (MICARDIS) 40 MG tablet TAKE 1 TABLET BY MOUTH ONCE DAILY (REPLACING  LISINOPRIL) 06/23/18  Yes Hawks, Christy A, FNP  predniSONE (DELTASONE) 50 MG tablet Take 1 tablet (50 mg total) by mouth as directed. Take 1 tablet (50 mg) po 13 hours, 7 hours and 1 hour prior to CT scan. Patient not taking: Reported on 07/05/2018 06/15/18   Mahala Menghini, PA-C    Allergies as of 07/05/2018 - Review Complete 07/05/2018  Allergen Reaction Noted  . Shellfish allergy Anaphylaxis and Other (See Comments) 09/06/2014  . Sulfa antibiotics  Anaphylaxis and Rash 09/06/2014  . Ace inhibitors Other (See Comments) and Cough 08/09/2013  . Invokana [canagliflozin] Other (See Comments) 09/04/2013  . Metformin and related Itching 07/12/2013  . Pravastatin sodium Other (See Comments) 11/14/2014  . Fenofibrate Other (See Comments) 11/14/2015  . Iodine Other (See Comments) 09/07/2014  . Lisinopril Cough 10/04/2014  . Livalo [pitavastatin] Other (See Comments) 06/25/2016  . Repatha [evolocumab]  01/04/2017  . Crestor [rosuvastatin] Other (See Comments) 06/05/2013  . Horse-derived products Rash 07/25/2008  . Lexapro [escitalopram oxalate] Other (See Comments) 01/13/2016  . Lipitor [atorvastatin] Other (See Comments) 06/05/2013  . Tape Rash 07/25/2008    Family History  Problem Relation Age of Onset  . Diabetes Father   . Valvular heart disease Father   . Arthritis Father   . Heart disease Father   . Stroke Father   .  Alzheimer's disease Mother   . Hyperlipidemia Mother   . Hypertension Mother   . Arthritis Mother   . Lung cancer Mother   . Stroke Mother   . Arthritis/Rheumatoid Sister   . Diabetes Sister   . Hypertension Sister   . Hyperlipidemia Sister   . Depression Sister   . Dementia Maternal Aunt   . Dementia Maternal Uncle   . Heart disease Maternal Uncle   . Stomach cancer Paternal Uncle   . Colon cancer Neg Hx   . Liver disease Neg Hx     Social History   Socioeconomic History  . Marital status: Married    Spouse name: Not on file  . Number of children: 2  . Years of education: Not on file  . Highest education level: Not on file  Occupational History  . Occupation: retired  Scientific laboratory technician  . Financial resource strain: Not hard at all  . Food insecurity:    Worry: Patient refused    Inability: Patient refused  . Transportation needs:    Medical: Patient refused    Non-medical: Patient refused  Tobacco Use  . Smoking status: Former Smoker    Packs/day: 0.25    Years: 51.00    Pack years: 12.75     Types: Cigarettes    Last attempt to quit: 08/14/2016    Years since quitting: 1.8  . Smokeless tobacco: Never Used  . Tobacco comment: smokes  a pack a week  Substance and Sexual Activity  . Alcohol use: No    Alcohol/week: 0.0 standard drinks  . Drug use: No  . Sexual activity: Yes  Lifestyle  . Physical activity:    Days per week: Patient refused    Minutes per session: Patient refused  . Stress: Not at all  Relationships  . Social connections:    Talks on phone: Patient refused    Gets together: Patient refused    Attends religious service: Patient refused    Active member of club or organization: Patient refused    Attends meetings of clubs or organizations: Patient refused    Relationship status: Patient refused  . Intimate partner violence:    Fear of current or ex partner: Patient refused    Emotionally abused: Patient refused    Physically abused: Patient refused    Forced sexual activity: Patient refused  Other Topics Concern  . Not on file  Social History Narrative   Lives with his wife.  Retired.  Unable to afford medicines.      Review of Systems: See HPI, otherwise negative ROS  Physical Exam: BP (!) 169/76   Pulse 91   Temp (!) 97.5 F (36.4 C) (Oral)   Ht 6\' 3"  (1.905 m)   Wt 292 lb (132.5 kg)   BMI 36.50 kg/m  General:   Alert,  , pleasant and cooperative in NAD Neck:  Supple; no masses or thyromegaly. No significant cervical adenopathy. Lungs:  Clear throughout to auscultation.   No wheezes, crackles, or rhonchi. No acute distress. Heart:  Regular rate and rhythm; no murmurs, clicks, rubs,  or gallops. Abdomen: Non-distended, normal bowel sounds.  Soft and nontender without appreciable mass or hepatosplenomegaly.  Pulses:  Normal pulses noted. Extremities:  Without clubbing or edema.  Impression/Plan: Pleasant 68 year old gentleman with recent diarrheal illness now resolved.  History of colonic adenomas.  He is due for surveillance colonoscopy at  this time.  Recommendations:  Schedule a surveillance colonoscopy  (hx of colon polyps) propofol.  The  risks, benefits, limitations, alternatives and imponderables have been reviewed with the patient. Questions have been answered. All parties are agreeable.   Stop Eliquis x 3 days prior to procedure  Decrease 70/30 insulin to 50 units twice the day before the procedure  Humalog 5 units before each meal day before procedure  Decrease Tresiba to 10 units at bedtime night before procedure  Further recommendations to follow    Notice: This dictation was prepared with Dragon dictation along with smaller phrase technology. Any transcriptional errors that result from this process are unintentional and may not be corrected upon review.

## 2018-07-05 NOTE — Telephone Encounter (Signed)
Tried to call pt to inform of pre-op appt 08/19/18 at 12:45pm, no answer, LMOAM. Letter mailed.

## 2018-07-12 ENCOUNTER — Ambulatory Visit (INDEPENDENT_AMBULATORY_CARE_PROVIDER_SITE_OTHER): Payer: Medicare Other | Admitting: Family

## 2018-07-12 ENCOUNTER — Encounter: Payer: Self-pay | Admitting: Family

## 2018-07-12 VITALS — BP 131/81 | HR 87 | Temp 97.0°F | Ht 75.0 in | Wt 293.4 lb

## 2018-07-12 DIAGNOSIS — I10 Essential (primary) hypertension: Secondary | ICD-10-CM | POA: Diagnosis not present

## 2018-07-12 DIAGNOSIS — Z794 Long term (current) use of insulin: Secondary | ICD-10-CM

## 2018-07-12 DIAGNOSIS — I5030 Unspecified diastolic (congestive) heart failure: Secondary | ICD-10-CM | POA: Diagnosis not present

## 2018-07-12 DIAGNOSIS — I252 Old myocardial infarction: Secondary | ICD-10-CM

## 2018-07-12 DIAGNOSIS — E1169 Type 2 diabetes mellitus with other specified complication: Secondary | ICD-10-CM | POA: Diagnosis not present

## 2018-07-12 DIAGNOSIS — E1142 Type 2 diabetes mellitus with diabetic polyneuropathy: Secondary | ICD-10-CM | POA: Diagnosis not present

## 2018-07-12 DIAGNOSIS — F331 Major depressive disorder, recurrent, moderate: Secondary | ICD-10-CM

## 2018-07-12 DIAGNOSIS — Z86010 Personal history of colonic polyps: Secondary | ICD-10-CM

## 2018-07-12 DIAGNOSIS — E1159 Type 2 diabetes mellitus with other circulatory complications: Secondary | ICD-10-CM

## 2018-07-12 DIAGNOSIS — E785 Hyperlipidemia, unspecified: Secondary | ICD-10-CM

## 2018-07-12 DIAGNOSIS — E0841 Diabetes mellitus due to underlying condition with diabetic mononeuropathy: Secondary | ICD-10-CM | POA: Diagnosis not present

## 2018-07-12 DIAGNOSIS — F419 Anxiety disorder, unspecified: Secondary | ICD-10-CM

## 2018-07-12 DIAGNOSIS — I2510 Atherosclerotic heart disease of native coronary artery without angina pectoris: Secondary | ICD-10-CM

## 2018-07-12 DIAGNOSIS — M159 Polyosteoarthritis, unspecified: Secondary | ICD-10-CM

## 2018-07-12 DIAGNOSIS — Z9861 Coronary angioplasty status: Secondary | ICD-10-CM

## 2018-07-12 LAB — BAYER DCA HB A1C WAIVED: HB A1C (BAYER DCA - WAIVED): 8 % — ABNORMAL HIGH (ref <7.0)

## 2018-07-12 MED ORDER — HYDROCODONE-ACETAMINOPHEN 10-325 MG PO TABS: 1.0000 | ORAL_TABLET | Freq: Two times a day (BID) | ORAL | 0 refills | Status: DC | PRN

## 2018-07-12 NOTE — Patient Instructions (Signed)
Diabetes Mellitus and Nutrition, Adult  When you have diabetes (diabetes mellitus), it is very important to have healthy eating habits because your blood sugar (glucose) levels are greatly affected by what you eat and drink. Eating healthy foods in the appropriate amounts, at about the same times every day, can help you:  · Control your blood glucose.  · Lower your risk of heart disease.  · Improve your blood pressure.  · Reach or maintain a healthy weight.  Every person with diabetes is different, and each person has different needs for a meal plan. Your health care provider may recommend that you work with a diet and nutrition specialist (dietitian) to make a meal plan that is best for you. Your meal plan may vary depending on factors such as:  · The calories you need.  · The medicines you take.  · Your weight.  · Your blood glucose, blood pressure, and cholesterol levels.  · Your activity level.  · Other health conditions you have, such as heart or kidney disease.  How do carbohydrates affect me?  Carbohydrates, also called carbs, affect your blood glucose level more than any other type of food. Eating carbs naturally raises the amount of glucose in your blood. Carb counting is a method for keeping track of how many carbs you eat. Counting carbs is important to keep your blood glucose at a healthy level, especially if you use insulin or take certain oral diabetes medicines.  It is important to know how many carbs you can safely have in each meal. This is different for every person. Your dietitian can help you calculate how many carbs you should have at each meal and for each snack.  Foods that contain carbs include:  · Bread, cereal, rice, pasta, and crackers.  · Potatoes and corn.  · Peas, beans, and lentils.  · Milk and yogurt.  · Fruit and juice.  · Desserts, such as cakes, cookies, ice cream, and candy.  How does alcohol affect me?  Alcohol can cause a sudden decrease in blood glucose (hypoglycemia),  especially if you use insulin or take certain oral diabetes medicines. Hypoglycemia can be a life-threatening condition. Symptoms of hypoglycemia (sleepiness, dizziness, and confusion) are similar to symptoms of having too much alcohol.  If your health care provider says that alcohol is safe for you, follow these guidelines:  · Limit alcohol intake to no more than 1 drink per day for nonpregnant women and 2 drinks per day for men. One drink equals 12 oz of beer, 5 oz of wine, or 1½ oz of hard liquor.  · Do not drink on an empty stomach.  · Keep yourself hydrated with water, diet soda, or unsweetened iced tea.  · Keep in mind that regular soda, juice, and other mixers may contain a lot of sugar and must be counted as carbs.  What are tips for following this plan?    Reading food labels  · Start by checking the serving size on the "Nutrition Facts" label of packaged foods and drinks. The amount of calories, carbs, fats, and other nutrients listed on the label is based on one serving of the item. Many items contain more than one serving per package.  · Check the total grams (g) of carbs in one serving. You can calculate the number of servings of carbs in one serving by dividing the total carbs by 15. For example, if a food has 30 g of total carbs, it would be equal to 2   servings of carbs.  · Check the number of grams (g) of saturated and trans fats in one serving. Choose foods that have low or no amount of these fats.  · Check the number of milligrams (mg) of salt (sodium) in one serving. Most people should limit total sodium intake to less than 2,300 mg per day.  · Always check the nutrition information of foods labeled as "low-fat" or "nonfat". These foods may be higher in added sugar or refined carbs and should be avoided.  · Talk to your dietitian to identify your daily goals for nutrients listed on the label.  Shopping  · Avoid buying canned, premade, or processed foods. These foods tend to be high in fat, sodium,  and added sugar.  · Shop around the outside edge of the grocery store. This includes fresh fruits and vegetables, bulk grains, fresh meats, and fresh dairy.  Cooking  · Use low-heat cooking methods, such as baking, instead of high-heat cooking methods like deep frying.  · Cook using healthy oils, such as olive, canola, or sunflower oil.  · Avoid cooking with butter, cream, or high-fat meats.  Meal planning  · Eat meals and snacks regularly, preferably at the same times every day. Avoid going long periods of time without eating.  · Eat foods high in fiber, such as fresh fruits, vegetables, beans, and whole grains. Talk to your dietitian about how many servings of carbs you can eat at each meal.  · Eat 4-6 ounces (oz) of lean protein each day, such as lean meat, chicken, fish, eggs, or tofu. One oz of lean protein is equal to:  ? 1 oz of meat, chicken, or fish.  ? 1 egg.  ? ¼ cup of tofu.  · Eat some foods each day that contain healthy fats, such as avocado, nuts, seeds, and fish.  Lifestyle  · Check your blood glucose regularly.  · Exercise regularly as told by your health care provider. This may include:  ? 150 minutes of moderate-intensity or vigorous-intensity exercise each week. This could be brisk walking, biking, or water aerobics.  ? Stretching and doing strength exercises, such as yoga or weightlifting, at least 2 times a week.  · Take medicines as told by your health care provider.  · Do not use any products that contain nicotine or tobacco, such as cigarettes and e-cigarettes. If you need help quitting, ask your health care provider.  · Work with a counselor or diabetes educator to identify strategies to manage stress and any emotional and social challenges.  Questions to ask a health care provider  · Do I need to meet with a diabetes educator?  · Do I need to meet with a dietitian?  · What number can I call if I have questions?  · When are the best times to check my blood glucose?  Where to find more  information:  · American Diabetes Association: diabetes.org  · Academy of Nutrition and Dietetics: www.eatright.org  · National Institute of Diabetes and Digestive and Kidney Diseases (NIH): www.niddk.nih.gov  Summary  · A healthy meal plan will help you control your blood glucose and maintain a healthy lifestyle.  · Working with a diet and nutrition specialist (dietitian) can help you make a meal plan that is best for you.  · Keep in mind that carbohydrates (carbs) and alcohol have immediate effects on your blood glucose levels. It is important to count carbs and to use alcohol carefully.  This information is not intended to   replace advice given to you by your health care provider. Make sure you discuss any questions you have with your health care provider.  Document Released: 01/29/2005 Document Revised: 12/02/2016 Document Reviewed: 06/08/2016  Elsevier Interactive Patient Education © 2019 Elsevier Inc.

## 2018-07-12 NOTE — Progress Notes (Signed)
Subjective:    Patient ID: [name] [name], male    DOB: Feb 05, 1951, 68 y.o.   MRN: [id number]  Chief Complaint  Patient presents with  . Medical Management of Chronic Issues   PT presents to the office today for chronic follow up. PT had NSTEMI on 07/30/16 and stent placed. Pt is followed by Cardiologistsevery 4 months. Pt currently in a clinical trial for hyperlipidemia.  Diabetes  He presents for his follow-up diabetic visit. He has type 2 diabetes mellitus. His disease course has been stable. Hypoglycemia symptoms include nervousness/anxiousness. Associated symptoms include foot paresthesias. Pertinent negatives for diabetes include no blurred vision and no visual change. There are no hypoglycemic complications. Symptoms are stable. Diabetic complications include heart disease. Risk factors for coronary artery disease include diabetes mellitus, dyslipidemia, family history and hypertension. He is following a diabetic diet. (Does not check at home )  Hyperlipidemia  This is a chronic problem. The current episode started more than 1 year ago. The problem is uncontrolled. Recent lipid tests were reviewed and are high. Exacerbating diseases include obesity. Associated symptoms include shortness of breath ("sometimes"). The current treatment provides moderate improvement of lipids. Risk factors for coronary artery disease include diabetes mellitus, dyslipidemia, male sex, hypertension and a sedentary lifestyle.  Hypertension  This is a chronic problem. The current episode started more than 1 year ago. The problem has been resolved since onset. The problem is controlled. Associated symptoms include anxiety, malaise/fatigue, peripheral edema ("sometimes") and shortness of breath ("sometimes"). Pertinent negatives include no blurred vision. Risk factors for coronary artery disease include dyslipidemia, diabetes mellitus, obesity, male gender and sedentary lifestyle. Hypertensive end-organ damage includes  CAD/MI and heart failure.  Congestive Heart Failure  Presents for follow-up visit. Associated symptoms include orthopnea and shortness of breath ("sometimes"). Pertinent negatives include no edema or unexpected weight change. The symptoms have been stable.  Arthritis  Presents for follow-up visit. He complains of pain and stiffness. The symptoms have been stable. Affected locations include the right knee and left knee (back). His pain is at a severity of 10/10.  Depression         This is a chronic problem.  The current episode started more than 1 year ago.   The onset quality is gradual.   The problem occurs intermittently.  The problem has been waxing and waning since onset.  Associated symptoms include irritable, restlessness, decreased interest and sad.  Associated symptoms include no helplessness and no hopelessness.  Past treatments include SNRIs - Serotonin and norepinephrine reuptake inhibitors.  Compliance with treatment is good.  Past medical history includes anxiety.   Anxiety  Presents for follow-up visit. Symptoms include depressed mood, excessive worry, irritability, nervous/anxious behavior, restlessness and shortness of breath ("sometimes"). Symptoms occur most days. The severity of symptoms is moderate. The quality of sleep is good.    Diabetic Neuropathy  PT complaining of numbness and tingling pain of 10 out 10.    Review of Systems  Constitutional: Positive for irritability and malaise/fatigue. Negative for unexpected weight change.  Eyes: Negative for blurred vision.  Respiratory: Positive for shortness of breath ("sometimes").   Musculoskeletal: Positive for arthritis and stiffness.  Psychiatric/Behavioral: Positive for depression. The patient is nervous/anxious.   All other systems reviewed and are negative.      Objective:   Physical Exam Vitals signs reviewed.  Constitutional:      General: He is irritable. He is not in acute distress.    Appearance: He is  well-developed.  HENT:     Head: Normocephalic.     Right Ear: Tympanic membrane normal.     Left Ear: Tympanic membrane normal.  Eyes:     General:        Right eye: No discharge.        Left eye: No discharge.     Pupils: Pupils are equal, round, and reactive to light.  Neck:     Musculoskeletal: Normal range of motion and neck supple.     Thyroid: No thyromegaly.  Cardiovascular:     Rate and Rhythm: Normal rate and regular rhythm.     Heart sounds: Normal heart sounds. No murmur.  Pulmonary:     Effort: Pulmonary effort is normal. No respiratory distress.     Breath sounds: Normal breath sounds. No wheezing.  Abdominal:     General: Bowel sounds are normal. There is no distension.     Palpations: Abdomen is soft.     Tenderness: There is no abdominal tenderness.  Musculoskeletal: Normal range of motion.        General: No tenderness.     Right lower leg: Edema (trace) present.     Left lower leg: Edema (trace) present.  Skin:    General: Skin is warm and dry.     Findings: No erythema or rash.  Neurological:     Mental Status: He is alert and oriented to person, place, and time.     Cranial Nerves: No cranial nerve deficit.     Deep Tendon Reflexes: Reflexes are normal and symmetric.  Psychiatric:        Behavior: Behavior normal.        Thought Content: Thought content normal.        Judgment: Judgment normal.     BP 131/81   Pulse 87   Temp (!) 97 F (36.1 C) (Oral)   Ht '6\' 3"'$  (1.905 m)   Wt 293 lb 6.4 oz (133.1 kg)   BMI 36.67 kg/m      Assessment & Plan:  [name] [name] comes in today with chief complaint of Medical Management of Chronic Issues   Diagnosis and orders addressed:  1. Diastolic congestive heart failure, unspecified HF chronicity (HCC) - CMP14+EGFR - CBC with Differential/Platelet  2. Hypertension associated with diabetes ([place]) - CMP14+EGFR - CBC with Differential/Platelet  3. Type 2 diabetes mellitus with diabetic polyneuropathy,  with long-term current use of insulin (HCC) - CMP14+EGFR - CBC with Differential/Platelet - Bayer DCA Hb A1c Waived  4. Diabetic mononeuropathy associated with diabetes mellitus due to underlying condition [facility]) Will do referral to Pain clinic patient's is retiring. I will give one month of Norco until he can follow up with new provider Pt reviewed in [place] controlled database- no redflags  - CMP14+EGFR - CBC with Differential/Platelet - Ambulatory referral to Pain Clinic  5. Hyperlipidemia associated with type 2 diabetes mellitus (HCC) - CMP14+EGFR - CBC with Differential/Platelet  6. Anxiety - CMP14+EGFR - CBC with Differential/Platelet  7. Moderate episode of recurrent major depressive disorder (HCC) - CMP14+EGFR - CBC with Differential/Platelet  8. Morbid obesity ([place]) - CMP14+EGFR - CBC with Differential/Platelet  9. History of non-ST elevation myocardial infarction (NSTEMI) - CMP14+EGFR - CBC with Differential/Platelet  10. History of colonic polyps - CMP14+EGFR - CBC with Differential/Platelet  11. CAD S/P percutaneous coronary angioplasty - CMP14+EGFR - CBC with Differential/Platelet  12. Osteoarthritis of multiple joints, unspecified osteoarthritis type - Ambulatory referral to Pain Clinic   Labs pending Health Maintenance  reviewed Diet and exercise encouraged  Follow up plan: 3 months    [name], FNP

## 2018-07-13 ENCOUNTER — Other Ambulatory Visit: Payer: Self-pay | Admitting: Family

## 2018-07-13 DIAGNOSIS — Z9861 Coronary angioplasty status: Principal | ICD-10-CM

## 2018-07-13 DIAGNOSIS — I2510 Atherosclerotic heart disease of native coronary artery without angina pectoris: Secondary | ICD-10-CM

## 2018-07-13 LAB — CBC WITH DIFFERENTIAL/PLATELET
Basophils Absolute: 0.1 10*3/uL (ref 0.0–0.2)
Basos: 1 %
EOS (ABSOLUTE): 0.2 10*3/uL (ref 0.0–0.4)
Eos: 2 %
Hematocrit: 38.6 % (ref 37.5–51.0)
Hemoglobin: 13.5 g/dL (ref 13.0–17.7)
Immature Grans (Abs): 0.1 10*3/uL (ref 0.0–0.1)
Immature Granulocytes: 1 %
Lymphocytes Absolute: 1.8 10*3/uL (ref 0.7–3.1)
Lymphs: 21 %
MCH: 30.3 pg (ref 26.6–33.0)
MCHC: 35 g/dL (ref 31.5–35.7)
MCV: 87 fL (ref 79–97)
Monocytes Absolute: 0.7 10*3/uL (ref 0.1–0.9)
Monocytes: 9 %
Neutrophils Absolute: 5.5 10*3/uL (ref 1.4–7.0)
Neutrophils: 66 %
Platelets: 294 10*3/uL (ref 150–450)
RBC: 4.45 x10E6/uL (ref 4.14–5.80)
RDW: 13 % (ref 11.6–15.4)
WBC: 8.2 10*3/uL (ref 3.4–10.8)

## 2018-07-13 LAB — CMP14+EGFR
ALT: 33 IU/L (ref 0–44)
AST: 33 IU/L (ref 0–40)
Albumin/Globulin Ratio: 1.7 (ref 1.2–2.2)
Albumin: 4.3 g/dL (ref 3.8–4.8)
Alkaline Phosphatase: 64 IU/L (ref 39–117)
BUN/Creatinine Ratio: 16 (ref 10–24)
BUN: 20 mg/dL (ref 8–27)
Bilirubin Total: 0.6 mg/dL (ref 0.0–1.2)
CO2: 26 mmol/L (ref 20–29)
Calcium: 9.9 mg/dL (ref 8.6–10.2)
Chloride: 93 mmol/L — ABNORMAL LOW (ref 96–106)
Creatinine, Ser: 1.28 mg/dL — ABNORMAL HIGH (ref 0.76–1.27)
GFR calc Af Amer: 67 mL/min/{1.73_m2} (ref >59)
GFR calc non Af Amer: 58 mL/min/{1.73_m2} — ABNORMAL LOW (ref >59)
Globulin, Total: 2.6 g/dL (ref 1.5–4.5)
Glucose: 185 mg/dL — ABNORMAL HIGH (ref 65–99)
Potassium: 4.4 mmol/L (ref 3.5–5.2)
Sodium: 137 mmol/L (ref 134–144)
Total Protein: 6.9 g/dL (ref 6.0–8.5)

## 2018-07-14 ENCOUNTER — Other Ambulatory Visit: Payer: Self-pay | Admitting: Family

## 2018-07-15 ENCOUNTER — Telehealth: Payer: Self-pay | Admitting: Family

## 2018-07-15 DIAGNOSIS — Z794 Long term (current) use of insulin: Principal | ICD-10-CM

## 2018-07-15 DIAGNOSIS — E1142 Type 2 diabetes mellitus with diabetic polyneuropathy: Secondary | ICD-10-CM

## 2018-07-15 NOTE — Telephone Encounter (Signed)
Patient called back to let you know he is taking Ozempic.

## 2018-07-18 NOTE — Telephone Encounter (Signed)
Patient states he is taking 1mg  Ozempic Weekly

## 2018-07-18 NOTE — Telephone Encounter (Signed)
Ok, we need to increase [place] to 20 units daily. Strict low carb diet and exercise.

## 2018-07-18 NOTE — Telephone Encounter (Signed)
Is he taking Ozempic 1 mg weekly?

## 2018-07-19 MED ORDER — INSULIN DEGLUDEC 200 UNIT/ML ~~LOC~~ SOPN: 20.0000 [IU] | PEN_INJECTOR | Freq: Every day | SUBCUTANEOUS | 1 refills | Status: DC

## 2018-07-19 NOTE — Telephone Encounter (Signed)
Patient aware to increase Tresiba to 20 units and to follow strict low carb diet and exercise.

## 2018-07-19 NOTE — Addendum Note (Signed)
Addended by: Wardell Heath on: 07/19/2018 08:23 AM   Modules accepted: Orders

## 2018-07-20 ENCOUNTER — Ambulatory Visit (INDEPENDENT_AMBULATORY_CARE_PROVIDER_SITE_OTHER): Payer: Medicare Other | Admitting: *Deleted

## 2018-07-20 DIAGNOSIS — R55 Syncope and collapse: Secondary | ICD-10-CM

## 2018-07-20 DIAGNOSIS — I639 Cerebral infarction, unspecified: Secondary | ICD-10-CM

## 2018-07-21 ENCOUNTER — Telehealth: Payer: Self-pay | Admitting: Family

## 2018-07-21 LAB — CUP PACEART REMOTE DEVICE CHECK
Date Time Interrogation Session: 20200305104210
Implantable Pulse Generator Implant Date: 20180108

## 2018-07-21 NOTE — Telephone Encounter (Signed)
Aware. 

## 2018-07-21 NOTE — Telephone Encounter (Signed)
Patient aware.

## 2018-07-21 NOTE — Telephone Encounter (Signed)
Need to increase Tresiba to 35 units.

## 2018-07-21 NOTE — Telephone Encounter (Signed)
Pt states fasting BS has been running > 200 in the AM Reviewed meds with pt Pt states he is taking Tresiba 30 units, Humalog 70/30  100 units BID and also humalog before meals Pt states compliance with medications Please advise

## 2018-07-28 NOTE — Progress Notes (Signed)
Carelink Summary Report / Loop Recorder 

## 2018-07-29 DIAGNOSIS — M47812 Spondylosis without myelopathy or radiculopathy, cervical region: Secondary | ICD-10-CM | POA: Diagnosis not present

## 2018-07-29 DIAGNOSIS — M9901 Segmental and somatic dysfunction of cervical region: Secondary | ICD-10-CM | POA: Diagnosis not present

## 2018-07-29 DIAGNOSIS — M9903 Segmental and somatic dysfunction of lumbar region: Secondary | ICD-10-CM | POA: Diagnosis not present

## 2018-07-29 DIAGNOSIS — M9902 Segmental and somatic dysfunction of thoracic region: Secondary | ICD-10-CM | POA: Diagnosis not present

## 2018-07-29 DIAGNOSIS — M546 Pain in thoracic spine: Secondary | ICD-10-CM | POA: Diagnosis not present

## 2018-08-02 DIAGNOSIS — M47812 Spondylosis without myelopathy or radiculopathy, cervical region: Secondary | ICD-10-CM | POA: Diagnosis not present

## 2018-08-02 DIAGNOSIS — M9902 Segmental and somatic dysfunction of thoracic region: Secondary | ICD-10-CM | POA: Diagnosis not present

## 2018-08-02 DIAGNOSIS — M9903 Segmental and somatic dysfunction of lumbar region: Secondary | ICD-10-CM | POA: Diagnosis not present

## 2018-08-02 DIAGNOSIS — M546 Pain in thoracic spine: Secondary | ICD-10-CM | POA: Diagnosis not present

## 2018-08-02 DIAGNOSIS — M9901 Segmental and somatic dysfunction of cervical region: Secondary | ICD-10-CM | POA: Diagnosis not present

## 2018-08-03 DIAGNOSIS — Z981 Arthrodesis status: Secondary | ICD-10-CM | POA: Diagnosis not present

## 2018-08-03 DIAGNOSIS — M48061 Spinal stenosis, lumbar region without neurogenic claudication: Secondary | ICD-10-CM | POA: Diagnosis not present

## 2018-08-03 DIAGNOSIS — M5136 Other intervertebral disc degeneration, lumbar region: Secondary | ICD-10-CM | POA: Diagnosis not present

## 2018-08-03 DIAGNOSIS — M4807 Spinal stenosis, lumbosacral region: Secondary | ICD-10-CM | POA: Diagnosis not present

## 2018-08-03 DIAGNOSIS — M1711 Unilateral primary osteoarthritis, right knee: Secondary | ICD-10-CM | POA: Diagnosis not present

## 2018-08-04 ENCOUNTER — Telehealth: Payer: Self-pay | Admitting: *Deleted

## 2018-08-04 NOTE — Telephone Encounter (Signed)
   [facility] Pre-operative Risk Assessment    Request for surgical clearance:  1. What type of surgery is being performed? L5-S1 TFESI   2. When is this surgery scheduled? TBD   3. What type of clearance is required (medical clearance vs. Pharmacy clearance to hold med vs. Both)? BOTH  4. Are there any medications that need to be held prior to surgery and how long?ELIQUIS X 3 DAYS PRIOR   5. Practice name and name of physician performing surgery? [facility]; DR. DAVID O'TOOLE   6. What is your office phone number [phone]    7.   What is your office fax number [phone]  8.   Anesthesia type (None, local, MAC, general) ? NONE LISTED    [name] [name] 08/04/2018, 1:09 PM  _________________________________________________________________   (provider comments below)

## 2018-08-04 NOTE — Telephone Encounter (Signed)
Patient with diagnosis of ATRIAL FIBRILLATION on E;LIQUIS for anticoagulation.    Procedure: L5-S1 TFESI Date of procedure: TBD  CHADS2-VASc score of  6 (HTN, AGE, DM2, stroke/tia x 2, CAD)  CrCl = 80 ML/MIN (adjusted BW for obese patient) Platelet count = 294 on 07/12/2018  Per office protocol, patient can hold ELIQUIS for 3 days prior to procedure.    *Will recommend bridging with Lovenox prior to procedure due to history of stroke*   Will forward recommendation to primary Cardiologist for assessment and opinion.

## 2018-08-05 NOTE — Telephone Encounter (Signed)
   Primary Cardiologist: Carlyle Dolly, MD  Chart reviewed as part of pre-operative protocol coverage. Given past medical history and time since last visit, based on ACC/AHA guidelines, [name] [name] would be at acceptable risk for the planned procedure without further cardiovascular testing.   OK to hold Eliquis 3 days pre op without bridging.  Resume as soon as possible post op.   I will route this recommendation to the requesting party via Epic fax function and remove from pre-op pool.  Please call with questions.  Kerin Ransom, PA-C 08/05/2018, 4:57 PM

## 2018-08-05 NOTE — Telephone Encounter (Signed)
LMTCB  Sarika Baldini PA-C 08/05/2018 4:44 PM

## 2018-08-05 NOTE — Telephone Encounter (Signed)
Ok to hold Eliquis for 3 days. I would not bridge. Restart Eliquis when ok with surgery. GT

## 2018-08-15 ENCOUNTER — Telehealth: Payer: Self-pay

## 2018-08-15 NOTE — Telephone Encounter (Signed)
Pre-op appt rescheduled to 10/13/18 at 11:00am. Letter mailed with procedure instructions.

## 2018-08-15 NOTE — Telephone Encounter (Signed)
Called pt to reschedule TCS w/Propofol w/RMR that was for 08/25/18 d/t COVID-19 restrictions. TCS rescheduled to 10/17/18 at 10:00am. Will mail new instructions after pre-op is rescheduled.

## 2018-08-19 ENCOUNTER — Inpatient Hospital Stay (HOSPITAL_COMMUNITY): Admission: RE | Admit: 2018-08-19 | Payer: Self-pay | Source: Ambulatory Visit

## 2018-08-23 ENCOUNTER — Ambulatory Visit (INDEPENDENT_AMBULATORY_CARE_PROVIDER_SITE_OTHER): Payer: Medicare Other | Admitting: *Deleted

## 2018-08-23 ENCOUNTER — Other Ambulatory Visit: Payer: Self-pay

## 2018-08-23 DIAGNOSIS — M47816 Spondylosis without myelopathy or radiculopathy, lumbar region: Secondary | ICD-10-CM | POA: Diagnosis not present

## 2018-08-23 DIAGNOSIS — I639 Cerebral infarction, unspecified: Secondary | ICD-10-CM

## 2018-08-23 DIAGNOSIS — M5136 Other intervertebral disc degeneration, lumbar region: Secondary | ICD-10-CM | POA: Diagnosis not present

## 2018-08-23 LAB — CUP PACEART REMOTE DEVICE CHECK
Date Time Interrogation Session: 20200407104029
Implantable Pulse Generator Implant Date: 20180108

## 2018-08-30 ENCOUNTER — Other Ambulatory Visit: Payer: Self-pay | Admitting: Family

## 2018-08-30 DIAGNOSIS — E1142 Type 2 diabetes mellitus with diabetic polyneuropathy: Secondary | ICD-10-CM

## 2018-08-31 ENCOUNTER — Other Ambulatory Visit: Payer: Self-pay | Admitting: *Deleted

## 2018-08-31 ENCOUNTER — Other Ambulatory Visit: Payer: Self-pay | Admitting: Family

## 2018-08-31 DIAGNOSIS — E1142 Type 2 diabetes mellitus with diabetic polyneuropathy: Secondary | ICD-10-CM

## 2018-08-31 DIAGNOSIS — F331 Major depressive disorder, recurrent, moderate: Secondary | ICD-10-CM

## 2018-08-31 MED ORDER — HUMALOG KWIKPEN 100 UNIT/ML ~~LOC~~ SOPN: PEN_INJECTOR | SUBCUTANEOUS | 1 refills | Status: DC

## 2018-08-31 NOTE — Progress Notes (Signed)
Carelink Summary Report / Loop Recorder 

## 2018-09-14 ENCOUNTER — Other Ambulatory Visit: Payer: Self-pay | Admitting: Cardiology

## 2018-09-14 DIAGNOSIS — I480 Paroxysmal atrial fibrillation: Secondary | ICD-10-CM

## 2018-09-21 ENCOUNTER — Other Ambulatory Visit: Payer: Self-pay | Admitting: Family

## 2018-09-22 DIAGNOSIS — M48061 Spinal stenosis, lumbar region without neurogenic claudication: Secondary | ICD-10-CM | POA: Diagnosis not present

## 2018-09-22 DIAGNOSIS — G894 Chronic pain syndrome: Secondary | ICD-10-CM | POA: Diagnosis not present

## 2018-09-22 DIAGNOSIS — M4807 Spinal stenosis, lumbosacral region: Secondary | ICD-10-CM | POA: Diagnosis not present

## 2018-09-22 DIAGNOSIS — M5136 Other intervertebral disc degeneration, lumbar region: Secondary | ICD-10-CM | POA: Diagnosis not present

## 2018-09-22 DIAGNOSIS — M1711 Unilateral primary osteoarthritis, right knee: Secondary | ICD-10-CM | POA: Diagnosis not present

## 2018-09-26 ENCOUNTER — Ambulatory Visit (INDEPENDENT_AMBULATORY_CARE_PROVIDER_SITE_OTHER): Payer: Medicare Other | Admitting: *Deleted

## 2018-09-26 ENCOUNTER — Other Ambulatory Visit: Payer: Self-pay

## 2018-09-26 DIAGNOSIS — R55 Syncope and collapse: Secondary | ICD-10-CM | POA: Diagnosis not present

## 2018-09-26 DIAGNOSIS — I639 Cerebral infarction, unspecified: Secondary | ICD-10-CM

## 2018-09-26 LAB — CUP PACEART REMOTE DEVICE CHECK
Date Time Interrogation Session: 20200510124108
Implantable Pulse Generator Implant Date: 20180108

## 2018-09-29 ENCOUNTER — Telehealth: Payer: Self-pay | Admitting: *Deleted

## 2018-09-29 NOTE — Telephone Encounter (Signed)
Phone visit with subject in the clear research trial.  No cos, aes or saes to report. Phone visit due to Covid 19 restrictions and subject gave verbal consent to have IP fedex'd to home. Will confirm meds received acceptable.  Will speak with subject to organize next in person visit.

## 2018-10-03 DIAGNOSIS — M1711 Unilateral primary osteoarthritis, right knee: Secondary | ICD-10-CM | POA: Diagnosis not present

## 2018-10-03 DIAGNOSIS — M25561 Pain in right knee: Secondary | ICD-10-CM | POA: Diagnosis not present

## 2018-10-03 DIAGNOSIS — G8929 Other chronic pain: Secondary | ICD-10-CM | POA: Diagnosis not present

## 2018-10-04 ENCOUNTER — Telehealth: Payer: Self-pay | Admitting: Internal Medicine

## 2018-10-04 NOTE — Telephone Encounter (Addendum)
Patient will call back to r/s. Called endo and LMOVM to cancel. FYI to RMR

## 2018-10-04 NOTE — Telephone Encounter (Signed)
Pt isn't comfortable going into the hospital due to the covid19 and wants to cancel his procedure with RMR on 6/1. He will call back to reschedule.

## 2018-10-04 NOTE — Telephone Encounter (Signed)
Communication noted.  

## 2018-10-04 NOTE — Progress Notes (Signed)
Carelink Summary Report / Loop Recorder 

## 2018-10-13 ENCOUNTER — Other Ambulatory Visit (HOSPITAL_COMMUNITY): Payer: Self-pay

## 2018-10-17 ENCOUNTER — Ambulatory Visit: Payer: Medicare Other | Admitting: Family

## 2018-10-17 ENCOUNTER — Ambulatory Visit (HOSPITAL_COMMUNITY): Admission: RE | Admit: 2018-10-17 | Payer: Medicare Other | Source: Ambulatory Visit | Admitting: Internal Medicine

## 2018-10-17 ENCOUNTER — Encounter (HOSPITAL_COMMUNITY): Admission: RE | Payer: Self-pay | Source: Ambulatory Visit

## 2018-10-17 DIAGNOSIS — M25561 Pain in right knee: Secondary | ICD-10-CM | POA: Diagnosis not present

## 2018-10-17 DIAGNOSIS — G8929 Other chronic pain: Secondary | ICD-10-CM | POA: Diagnosis not present

## 2018-10-17 DIAGNOSIS — M1711 Unilateral primary osteoarthritis, right knee: Secondary | ICD-10-CM | POA: Diagnosis not present

## 2018-10-17 SURGERY — COLONOSCOPY WITH PROPOFOL
Anesthesia: Monitor Anesthesia Care

## 2018-10-19 ENCOUNTER — Ambulatory Visit (INDEPENDENT_AMBULATORY_CARE_PROVIDER_SITE_OTHER): Payer: Medicare Other | Admitting: Family

## 2018-10-19 ENCOUNTER — Other Ambulatory Visit: Payer: Self-pay

## 2018-10-19 ENCOUNTER — Encounter: Payer: Self-pay | Admitting: Family

## 2018-10-19 DIAGNOSIS — Z794 Long term (current) use of insulin: Secondary | ICD-10-CM

## 2018-10-19 DIAGNOSIS — E0841 Diabetes mellitus due to underlying condition with diabetic mononeuropathy: Secondary | ICD-10-CM | POA: Diagnosis not present

## 2018-10-19 DIAGNOSIS — E1169 Type 2 diabetes mellitus with other specified complication: Secondary | ICD-10-CM

## 2018-10-19 DIAGNOSIS — I10 Essential (primary) hypertension: Secondary | ICD-10-CM

## 2018-10-19 DIAGNOSIS — I5030 Unspecified diastolic (congestive) heart failure: Secondary | ICD-10-CM

## 2018-10-19 DIAGNOSIS — F419 Anxiety disorder, unspecified: Secondary | ICD-10-CM | POA: Diagnosis not present

## 2018-10-19 DIAGNOSIS — E1142 Type 2 diabetes mellitus with diabetic polyneuropathy: Secondary | ICD-10-CM | POA: Diagnosis not present

## 2018-10-19 DIAGNOSIS — E1159 Type 2 diabetes mellitus with other circulatory complications: Secondary | ICD-10-CM

## 2018-10-19 DIAGNOSIS — F331 Major depressive disorder, recurrent, moderate: Secondary | ICD-10-CM | POA: Diagnosis not present

## 2018-10-19 DIAGNOSIS — E785 Hyperlipidemia, unspecified: Secondary | ICD-10-CM

## 2018-10-19 DIAGNOSIS — K219 Gastro-esophageal reflux disease without esophagitis: Secondary | ICD-10-CM

## 2018-10-19 DIAGNOSIS — I252 Old myocardial infarction: Secondary | ICD-10-CM

## 2018-10-19 NOTE — Progress Notes (Signed)
Virtual Visit via telephone Note  I connected with [name] [name] on 10/19/18 at 9:30 AM by telephone and verified that I am speaking with the correct person using two identifiers. [name] [name] is currently located at home and wife is currently with her during visit. The provider, [name], FNP is located in their office at time of visit.  I discussed the limitations, risks, security and privacy concerns of performing an evaluation and management service by telephone and the availability of in person appointments. I also discussed with the patient that there may be a patient responsible charge related to this service. The patient expressed understanding and agreed to proceed.   History and Present Illness:  PT presents to the office today for chronic follow up. PT had NSTEMI on 07/30/16 and stent placed. Pt is followed by Cardiologistsevery 4 months. Pt currently in a clinical trial for hyperlipidemia. He is followed by Ortho for osteoarthritis and is currently getting injections in bilateral knees.  Diabetes  He presents for his follow-up diabetic visit. He has type 2 diabetes mellitus. Hypoglycemia symptoms include nervousness/anxiousness. Associated symptoms include foot paresthesias. Pertinent negatives for diabetes include no blurred vision. Symptoms are worsening. Diabetic complications include heart disease and nephropathy. Risk factors for coronary artery disease include dyslipidemia, diabetes mellitus, male sex, obesity and sedentary lifestyle. He is following a generally unhealthy diet. His overall blood glucose range is 140-180 mg/dl. Eye exam is not current.  Hypertension  This is a chronic problem. The current episode started more than 1 year ago. The problem has been waxing and waning since onset. The problem is controlled. Associated symptoms include anxiety, malaise/fatigue and shortness of breath ("with exertion"). Pertinent negatives include no blurred vision or peripheral  edema. Risk factors for coronary artery disease include dyslipidemia, diabetes mellitus, obesity and sedentary lifestyle. The current treatment provides moderate improvement. Hypertensive end-organ damage includes CAD/MI and heart failure.  Hyperlipidemia  This is a chronic problem. The current episode started more than 1 year ago. The problem is uncontrolled. Recent lipid tests were reviewed and are high. Exacerbating diseases include obesity. Associated symptoms include shortness of breath ("with exertion").  Gastroesophageal Reflux  He complains of belching and heartburn. This is a chronic problem. The current episode started more than 1 year ago. The problem occurs occasionally. The problem has been waxing and waning. He has tried a PPI for the symptoms. The treatment provided moderate relief.  Anxiety  Presents for follow-up visit. Symptoms include depressed mood, excessive worry, irritability, nervous/anxious behavior, restlessness and shortness of breath ("with exertion"). Symptoms occur occasionally. The severity of symptoms is moderate. The quality of sleep is good.    Depression         This is a chronic problem.  The current episode started more than 1 year ago.   The onset quality is gradual.   The problem occurs intermittently.  The problem has been waxing and waning since onset.  Associated symptoms include irritable, restlessness, decreased interest and sad.  Associated symptoms include no helplessness and no hopelessness.  Past treatments include SNRIs - Serotonin and norepinephrine reuptake inhibitors.  Past medical history includes anxiety.    Diabetic Neuropathy Pt complaining of bilateral numbness and pain in his feet of 7-8 out 10.    Review of Systems  Constitutional: Positive for irritability and malaise/fatigue.  Eyes: Negative for blurred vision.  Respiratory: Positive for shortness of breath ("with exertion").   Gastrointestinal: Positive for heartburn.   Psychiatric/Behavioral: Positive for depression.  The patient is nervous/anxious.      Observations/Objective: No SOB or distress  Assessment and Plan: [name] comes in today with chief complaint of No chief complaint on file.   Diagnosis and orders addressed:  1. Anxiety - CBC with Differential/Platelet; Future - CMP14+EGFR; Future  2. Type 2 diabetes mellitus with diabetic polyneuropathy, with long-term current use of insulin (HCC) - CBC with Differential/Platelet; Future - CMP14+EGFR; Future  3. Diabetic mononeuropathy associated with diabetes mellitus due to underlying condition ([place]) - Bayer DCA Hb A1c Waived; Future - CBC with Differential/Platelet; Future - Microalbumin / creatinine urine ratio; Future - CMP14+EGFR; Future  4. Moderate episode of recurrent major depressive disorder (HCC) - CBC with Differential/Platelet; Future - CMP14+EGFR; Future  5. Diastolic congestive heart failure, unspecified HF chronicity (HCC) - CBC with Differential/Platelet; Future - CMP14+EGFR; Future  6. Gastroesophageal reflux disease without esophagitis - CBC with Differential/Platelet; Future - CMP14+EGFR; Future  7. History of non-ST elevation myocardial infarction (NSTEMI) - CBC with Differential/Platelet; Future - CMP14+EGFR; Future  8. Hyperlipidemia associated with type 2 diabetes mellitus (HCC) - CBC with Differential/Platelet; Future - Lipid panel; Future - CMP14+EGFR; Future  9. Hypertension associated with diabetes ([place]) - CBC with Differential/Platelet; Future - CMP14+EGFR; Future  10. Morbid obesity ([place]) - CBC with Differential/Platelet; Future - CMP14+EGFR; Future   Labs pending Health Maintenance reviewed Diet and exercise encouraged  Follow up plan: 3 months     I discussed the assessment and treatment plan with the patient. The patient was provided an opportunity to ask questions and all were answered. The patient agreed with the plan and  demonstrated an understanding of the instructions.   The patient was advised to call back or seek an in-person evaluation if the symptoms worsen or if the condition fails to improve as anticipated.  The above assessment and management plan was discussed with the patient. The patient verbalized understanding of and has agreed to the management plan. Patient is aware to call the clinic if symptoms persist or worsen. Patient is aware when to return to the clinic for a follow-up visit. Patient educated on when it is appropriate to go to the emergency department.   Time call ended:  9:52 AM  I provided 22 minutes of non-face-to-face time during this encounter.    [name], FNP

## 2018-10-24 DIAGNOSIS — M1711 Unilateral primary osteoarthritis, right knee: Secondary | ICD-10-CM | POA: Diagnosis not present

## 2018-10-24 DIAGNOSIS — G8929 Other chronic pain: Secondary | ICD-10-CM | POA: Diagnosis not present

## 2018-10-24 DIAGNOSIS — M25561 Pain in right knee: Secondary | ICD-10-CM | POA: Diagnosis not present

## 2018-10-25 ENCOUNTER — Other Ambulatory Visit: Payer: Self-pay | Admitting: Family

## 2018-10-25 ENCOUNTER — Telehealth: Payer: Self-pay

## 2018-10-25 DIAGNOSIS — E1142 Type 2 diabetes mellitus with diabetic polyneuropathy: Secondary | ICD-10-CM

## 2018-10-25 NOTE — Telephone Encounter (Signed)
Spoke to pt regarding symptom transmission, pt states he woke up in the middle of the night with SOB, chest tightness, and dizziness. Transmission shows stable AF burden. Available ECG appears to be AF w/ PVCs. Will route to triage pool for further recommendations.  Appears pt may be overdue with primary cardiology.

## 2018-10-26 ENCOUNTER — Other Ambulatory Visit: Payer: Self-pay | Admitting: Family

## 2018-10-26 DIAGNOSIS — K219 Gastro-esophageal reflux disease without esophagitis: Secondary | ICD-10-CM

## 2018-10-26 NOTE — Telephone Encounter (Signed)
Will forward to Dr. Branch. 

## 2018-10-26 NOTE — Telephone Encounter (Signed)
Pt declined appt with Dr Harl Bowie - wants Dr Leanora Ivanoff and says he had requested Dr [name] to be primary cardiologist - says he has been taking Lopressor 50 mg bid for the last month and take additional and helps resolve symptoms. Denies any symptoms at this time. Will forward to Dr Lovena Le

## 2018-10-26 NOTE — Telephone Encounter (Signed)
Overdue for follow up, I can see him tomorrow virtually at 1030, Staci please arrange   Zandra Abts MD

## 2018-10-28 ENCOUNTER — Ambulatory Visit (INDEPENDENT_AMBULATORY_CARE_PROVIDER_SITE_OTHER): Payer: Medicare Other | Admitting: *Deleted

## 2018-10-28 DIAGNOSIS — I639 Cerebral infarction, unspecified: Secondary | ICD-10-CM

## 2018-10-28 LAB — CUP PACEART REMOTE DEVICE CHECK
Date Time Interrogation Session: 20200612133840
Implantable Pulse Generator Implant Date: 20180108

## 2018-10-31 ENCOUNTER — Other Ambulatory Visit: Payer: Medicare Other

## 2018-10-31 ENCOUNTER — Other Ambulatory Visit: Payer: Self-pay

## 2018-10-31 DIAGNOSIS — E785 Hyperlipidemia, unspecified: Secondary | ICD-10-CM

## 2018-10-31 DIAGNOSIS — E1169 Type 2 diabetes mellitus with other specified complication: Secondary | ICD-10-CM

## 2018-10-31 DIAGNOSIS — F419 Anxiety disorder, unspecified: Secondary | ICD-10-CM

## 2018-10-31 DIAGNOSIS — E1142 Type 2 diabetes mellitus with diabetic polyneuropathy: Secondary | ICD-10-CM | POA: Diagnosis not present

## 2018-10-31 DIAGNOSIS — F331 Major depressive disorder, recurrent, moderate: Secondary | ICD-10-CM

## 2018-10-31 DIAGNOSIS — E1159 Type 2 diabetes mellitus with other circulatory complications: Secondary | ICD-10-CM

## 2018-10-31 DIAGNOSIS — E0841 Diabetes mellitus due to underlying condition with diabetic mononeuropathy: Secondary | ICD-10-CM

## 2018-10-31 DIAGNOSIS — I252 Old myocardial infarction: Secondary | ICD-10-CM

## 2018-10-31 DIAGNOSIS — I5030 Unspecified diastolic (congestive) heart failure: Secondary | ICD-10-CM

## 2018-10-31 DIAGNOSIS — Z794 Long term (current) use of insulin: Secondary | ICD-10-CM | POA: Diagnosis not present

## 2018-10-31 DIAGNOSIS — K219 Gastro-esophageal reflux disease without esophagitis: Secondary | ICD-10-CM

## 2018-10-31 LAB — BAYER DCA HB A1C WAIVED: HB A1C (BAYER DCA - WAIVED): 7.2 % — ABNORMAL HIGH (ref <7.0)

## 2018-10-31 LAB — LIPID PANEL

## 2018-10-31 NOTE — Progress Notes (Signed)
Carelink Summary Report / Loop Recorder 

## 2018-11-01 LAB — CMP14+EGFR
ALT: 28 IU/L (ref 0–44)
AST: 33 IU/L (ref 0–40)
Albumin/Globulin Ratio: 1.7 (ref 1.2–2.2)
Albumin: 4.5 g/dL (ref 3.8–4.8)
Alkaline Phosphatase: 78 IU/L (ref 39–117)
BUN/Creatinine Ratio: 13 (ref 10–24)
BUN: 19 mg/dL (ref 8–27)
Bilirubin Total: 0.6 mg/dL (ref 0.0–1.2)
CO2: 22 mmol/L (ref 20–29)
Calcium: 10.2 mg/dL (ref 8.6–10.2)
Chloride: 91 mmol/L — ABNORMAL LOW (ref 96–106)
Creatinine, Ser: 1.47 mg/dL — ABNORMAL HIGH (ref 0.76–1.27)
GFR calc Af Amer: 56 mL/min/{1.73_m2} — ABNORMAL LOW (ref >59)
GFR calc non Af Amer: 48 mL/min/{1.73_m2} — ABNORMAL LOW (ref >59)
Globulin, Total: 2.7 g/dL (ref 1.5–4.5)
Glucose: 360 mg/dL — ABNORMAL HIGH (ref 65–99)
Potassium: 5 mmol/L (ref 3.5–5.2)
Sodium: 135 mmol/L (ref 134–144)
Total Protein: 7.2 g/dL (ref 6.0–8.5)

## 2018-11-01 LAB — CBC WITH DIFFERENTIAL/PLATELET
Basophils Absolute: 0.1 10*3/uL (ref 0.0–0.2)
Basos: 1 %
EOS (ABSOLUTE): 0.2 10*3/uL (ref 0.0–0.4)
Eos: 2 %
Hematocrit: 43.5 % (ref 37.5–51.0)
Hemoglobin: 15 g/dL (ref 13.0–17.7)
Immature Grans (Abs): 0.1 10*3/uL (ref 0.0–0.1)
Immature Granulocytes: 1 %
Lymphocytes Absolute: 2.2 10*3/uL (ref 0.7–3.1)
Lymphs: 28 %
MCH: 30.2 pg (ref 26.6–33.0)
MCHC: 34.5 g/dL (ref 31.5–35.7)
MCV: 88 fL (ref 79–97)
Monocytes Absolute: 0.6 10*3/uL (ref 0.1–0.9)
Monocytes: 8 %
Neutrophils Absolute: 4.7 10*3/uL (ref 1.4–7.0)
Neutrophils: 60 %
Platelets: 310 10*3/uL (ref 150–450)
RBC: 4.96 x10E6/uL (ref 4.14–5.80)
RDW: 12.4 % (ref 11.6–15.4)
WBC: 7.8 10*3/uL (ref 3.4–10.8)

## 2018-11-01 LAB — LIPID PANEL
Chol/HDL Ratio: 15.1 ratio — ABNORMAL HIGH (ref 0.0–5.0)
Cholesterol, Total: 242 mg/dL — ABNORMAL HIGH (ref 100–199)
HDL: 16 mg/dL — ABNORMAL LOW (ref >39)
Triglycerides: 999 mg/dL (ref 0–149)

## 2018-11-04 ENCOUNTER — Telehealth: Payer: Self-pay | Admitting: Family

## 2018-11-04 NOTE — Telephone Encounter (Signed)
Pt aware of results 

## 2018-11-21 DIAGNOSIS — G894 Chronic pain syndrome: Secondary | ICD-10-CM | POA: Diagnosis not present

## 2018-11-21 DIAGNOSIS — M48061 Spinal stenosis, lumbar region without neurogenic claudication: Secondary | ICD-10-CM | POA: Diagnosis not present

## 2018-11-21 DIAGNOSIS — M5136 Other intervertebral disc degeneration, lumbar region: Secondary | ICD-10-CM | POA: Diagnosis not present

## 2018-11-21 DIAGNOSIS — M25561 Pain in right knee: Secondary | ICD-10-CM | POA: Diagnosis not present

## 2018-11-21 DIAGNOSIS — M1711 Unilateral primary osteoarthritis, right knee: Secondary | ICD-10-CM | POA: Diagnosis not present

## 2018-11-30 ENCOUNTER — Other Ambulatory Visit: Payer: Self-pay | Admitting: Family

## 2018-11-30 ENCOUNTER — Ambulatory Visit (INDEPENDENT_AMBULATORY_CARE_PROVIDER_SITE_OTHER): Payer: Medicare Other | Admitting: *Deleted

## 2018-11-30 DIAGNOSIS — I639 Cerebral infarction, unspecified: Secondary | ICD-10-CM | POA: Diagnosis not present

## 2018-11-30 DIAGNOSIS — F331 Major depressive disorder, recurrent, moderate: Secondary | ICD-10-CM

## 2018-11-30 DIAGNOSIS — E1142 Type 2 diabetes mellitus with diabetic polyneuropathy: Secondary | ICD-10-CM

## 2018-11-30 LAB — CUP PACEART REMOTE DEVICE CHECK
Date Time Interrogation Session: 20200715144217
Implantable Pulse Generator Implant Date: 20180108

## 2018-12-09 NOTE — Progress Notes (Signed)
Carelink Summary Report / Loop Recorder 

## 2018-12-14 ENCOUNTER — Other Ambulatory Visit: Payer: Self-pay | Admitting: Cardiology

## 2018-12-14 ENCOUNTER — Other Ambulatory Visit: Payer: Self-pay | Admitting: Family

## 2018-12-14 DIAGNOSIS — I5030 Unspecified diastolic (congestive) heart failure: Secondary | ICD-10-CM

## 2018-12-14 DIAGNOSIS — E785 Hyperlipidemia, unspecified: Secondary | ICD-10-CM

## 2018-12-14 DIAGNOSIS — I2510 Atherosclerotic heart disease of native coronary artery without angina pectoris: Secondary | ICD-10-CM

## 2018-12-14 DIAGNOSIS — I480 Paroxysmal atrial fibrillation: Secondary | ICD-10-CM

## 2018-12-14 DIAGNOSIS — E1169 Type 2 diabetes mellitus with other specified complication: Secondary | ICD-10-CM

## 2018-12-17 ENCOUNTER — Other Ambulatory Visit: Payer: Self-pay | Admitting: Family

## 2018-12-17 DIAGNOSIS — E1142 Type 2 diabetes mellitus with diabetic polyneuropathy: Secondary | ICD-10-CM

## 2018-12-21 ENCOUNTER — Other Ambulatory Visit: Payer: Self-pay

## 2018-12-22 ENCOUNTER — Ambulatory Visit (INDEPENDENT_AMBULATORY_CARE_PROVIDER_SITE_OTHER): Payer: Medicare Other | Admitting: Family

## 2018-12-22 ENCOUNTER — Encounter: Payer: Self-pay | Admitting: Family

## 2018-12-22 VITALS — BP 134/79 | HR 88 | Temp 97.7°F | Ht 75.0 in | Wt 298.0 lb

## 2018-12-22 DIAGNOSIS — Z9861 Coronary angioplasty status: Secondary | ICD-10-CM

## 2018-12-22 DIAGNOSIS — I2510 Atherosclerotic heart disease of native coronary artery without angina pectoris: Secondary | ICD-10-CM | POA: Diagnosis not present

## 2018-12-22 DIAGNOSIS — I739 Peripheral vascular disease, unspecified: Secondary | ICD-10-CM

## 2018-12-22 NOTE — Progress Notes (Signed)
Subjective:    Patient ID: [name] [name], male    DOB: 02-23-1951, 68 y.o.   MRN: [id number]  Chief Complaint  Patient presents with  . Leg Swelling    HPI Pt presents to the office today with bilateral leg swelling that started July 4 weekend. He states both legs started swelling and then noticed several blisters. He states the blisters "popped" and now that are scabbed over. He reports the swelling is improved, because he has been keeping his feet elevated all the time.   He states when he walks he gets pain from his knee down. He states when he was walking around at church, he could not get into the truck and had to 'pull himself" in the car because he was having weakness.   He is followed by Cardiologists and has appt in the next few weeks.    Review of Systems  Cardiovascular: Positive for leg swelling.  All other systems reviewed and are negative.      Objective:   Physical Exam Vitals signs reviewed.  Constitutional:      General: He is not in acute distress.    Appearance: He is well-developed. He is obese.  HENT:     Head: Normocephalic.  Eyes:     General:        Right eye: No discharge.        Left eye: No discharge.     Pupils: Pupils are equal, round, and reactive to light.  Neck:     Musculoskeletal: Normal range of motion and neck supple.     Thyroid: No thyromegaly.  Cardiovascular:     Rate and Rhythm: Normal rate. Rhythm irregular.     Heart sounds: Normal heart sounds. No murmur.  Pulmonary:     Effort: Pulmonary effort is normal. No respiratory distress.     Breath sounds: Normal breath sounds. No wheezing.  Abdominal:     General: Bowel sounds are normal. There is no distension.     Palpations: Abdomen is soft.     Tenderness: There is no abdominal tenderness.  Musculoskeletal: Normal range of motion.        General: No tenderness.     Right lower leg: Edema (trace) present.     Left lower leg: Edema (trace) present.     Comments: Vascular  changes with discoloration present in bilateral legs.   Skin:    General: Skin is warm and dry.     Findings: No erythema or rash.  Neurological:     Mental Status: He is alert and oriented to person, place, and time.     Cranial Nerves: No cranial nerve deficit.     Deep Tendon Reflexes: Reflexes are normal and symmetric.  Psychiatric:        Behavior: Behavior normal.        Thought Content: Thought content normal.        Judgment: Judgment normal.       BP 134/79   Pulse 88   Temp 97.7 F (36.5 C) (Oral)   Ht 6\' 3"  (1.905 m)   Wt 298 lb (135.2 kg)   BMI 37.25 kg/m      Assessment & Plan:  [name] [name] comes in today with chief complaint of Leg Swelling   Diagnosis and orders addressed:  1. PAD (peripheral artery disease) ([place]) Rest Keep elevated Start wearing your compression hose every day Encouraged healthy diet and exercise  Referral to vascular  - Ambulatory referral  to Vascular Surgery  2. Morbid obesity ([place]) - Ambulatory referral to Vascular Surgery  3. CAD S/P percutaneous coronary angioplasty - Ambulatory referral to Vascular Surgery   Labs pending Health Maintenance reviewed Diet and exercise encouraged  Follow up plan: Keep chronic follow up   [name], FNP

## 2018-12-22 NOTE — Patient Instructions (Signed)

## 2018-12-23 LAB — CMP14+EGFR
ALT: 31 IU/L (ref 0–44)
AST: 39 IU/L (ref 0–40)
Albumin/Globulin Ratio: 1.5 (ref 1.2–2.2)
Albumin: 4.7 g/dL (ref 3.8–4.8)
Alkaline Phosphatase: 79 IU/L (ref 39–117)
BUN/Creatinine Ratio: 16 (ref 10–24)
BUN: 23 mg/dL (ref 8–27)
Bilirubin Total: 0.6 mg/dL (ref 0.0–1.2)
CO2: 24 mmol/L (ref 20–29)
Calcium: 10.7 mg/dL — ABNORMAL HIGH (ref 8.6–10.2)
Chloride: 94 mmol/L — ABNORMAL LOW (ref 96–106)
Creatinine, Ser: 1.45 mg/dL — ABNORMAL HIGH (ref 0.76–1.27)
GFR calc Af Amer: 57 mL/min/{1.73_m2} — ABNORMAL LOW (ref >59)
GFR calc non Af Amer: 49 mL/min/{1.73_m2} — ABNORMAL LOW (ref >59)
Globulin, Total: 3.1 g/dL (ref 1.5–4.5)
Glucose: 143 mg/dL — ABNORMAL HIGH (ref 65–99)
Potassium: 4.6 mmol/L (ref 3.5–5.2)
Sodium: 136 mmol/L (ref 134–144)
Total Protein: 7.8 g/dL (ref 6.0–8.5)

## 2018-12-27 ENCOUNTER — Other Ambulatory Visit: Payer: Self-pay | Admitting: Family

## 2018-12-27 DIAGNOSIS — R7989 Other specified abnormal findings of blood chemistry: Secondary | ICD-10-CM

## 2019-01-02 ENCOUNTER — Ambulatory Visit (INDEPENDENT_AMBULATORY_CARE_PROVIDER_SITE_OTHER): Payer: Medicare Other | Admitting: *Deleted

## 2019-01-02 DIAGNOSIS — I639 Cerebral infarction, unspecified: Secondary | ICD-10-CM | POA: Diagnosis not present

## 2019-01-02 LAB — CUP PACEART REMOTE DEVICE CHECK
Date Time Interrogation Session: 20200817135023
Implantable Pulse Generator Implant Date: 20180108

## 2019-01-04 ENCOUNTER — Other Ambulatory Visit: Payer: Self-pay | Admitting: Family

## 2019-01-04 ENCOUNTER — Ambulatory Visit (INDEPENDENT_AMBULATORY_CARE_PROVIDER_SITE_OTHER): Payer: Medicare Other | Admitting: *Deleted

## 2019-01-04 VITALS — Ht 75.0 in | Wt 298.1 lb

## 2019-01-04 DIAGNOSIS — Z0000 Encounter for general adult medical examination without abnormal findings: Secondary | ICD-10-CM

## 2019-01-04 NOTE — Progress Notes (Signed)
MEDICARE ANNUAL WELLNESS VISIT  01/04/2019  Telephone Visit Disclaimer This Medicare AWV was conducted by telephone due to national recommendations for restrictions regarding the COVID-19 Pandemic (e.g. social distancing).  I verified, using two identifiers, that I am speaking with [name] [name] or their authorized healthcare agent. I discussed the limitations, risks, security, and privacy concerns of performing an evaluation and management service by telephone and the potential availability of an in-person appointment in the future. The patient expressed understanding and agreed to proceed.   Subjective:  [name] [name] is a 68 y.o. male patient of Hawks, Theador Hawthorne, FNP who had a Medicare Annual Wellness Visit today via telephone. [name] is Retired and lives with their spouse. he has 2 children. he reports that he is socially active and does interact with friends/family regularly. he is minimally physically active and enjoys fishing.  Patient Care Team: [name], FNP as PCP - General (Family Medicine) Harl Bowie, Alphonse Guild, MD as PCP - Cardiology (Cardiology) Rodolph Bong, MD (Inactive) as Attending Physician (Cardiology) Glenna Fellows, MD as Attending Physician (Neurosurgery) [name], MD as Consulting Physician (Vascular Surgery) Carolan Clines, MD (Inactive) as Consulting Physician (Urology) [name] Cristopher Estimable, MD as Consulting Physician (Gastroenterology) Dorene Ar, MD as Consulting Physician (Pain Medicine) Melvenia Beam, MD as Consulting Physician (Neurology)  Advanced Directives 01/04/2019 01/18/2018 01/18/2018 12/22/2017 12/15/2016 07/29/2016 05/21/2016  Does Patient Have a Medical Advance Directive? Yes Yes Yes No No No No  Type of Paramedic of [place];Living will Living will;Healthcare Power of Attorney Living will - - - -  Does patient want to make changes to medical advance directive? No - Patient declined No - Patient declined - - - - -   Copy of [place] in Chart? No - copy requested No - copy requested - - - - -  Would patient like information on creating a medical advance directive? - - - No - Patient declined No - Patient declined No - Patient declined No - Patient declined    Hospital Utilization Over the Past 12 Months: # of hospitalizations or ER visits: 0 # of surgeries: 0  Review of Systems    Patient reports that his overall health is unchanged compared to last year.  Patient Reported Readings (BP, Pulse, CBG, Weight, etc) none  Review of Systems: History obtained from chart review and the patient General ROS: negative  All other systems negative.  Pain Assessment Pain : No/denies pain     Current Medications & Allergies (verified) Allergies as of 01/04/2019      Reactions   Shellfish Allergy Anaphylaxis, Other (See Comments)   Tongue swelling, hives   Sulfa Antibiotics Anaphylaxis, Rash   Tongue swelling, hives   Ace Inhibitors Other (See Comments), Cough   CKD, renal failure    Invokana [canagliflozin] Other (See Comments)   Syncope / dehydration   Metformin And Related Itching   Pravastatin Sodium Other (See Comments)   myalgias   Fenofibrate Other (See Comments)   Body aches   Iodine Other (See Comments)   ????. PATIENT SAYS HE DID FINE WITH LAST CT WITH CONTRAST** patient had IV contrast on 06/21/2018 without pre meds without any issues. Patient had allergic reaction to Shellfish 30 years ago-not to IV contrast.    Lisinopril Cough   Livalo [pitavastatin] Other (See Comments)   Myalgias   Repatha [evolocumab]    Myalgias, flu like sx   Crestor [rosuvastatin] Other (See  Comments)   Myalgias   Horse-derived Products Rash   Lexapro [escitalopram Oxalate] Other (See Comments)   Buzzing in ears,headache, felt like a zombie   Lipitor [atorvastatin] Other (See Comments)   myalgias   Tape Rash      Medication List       Accurate as of January 04, 2019  8:50 AM. If  you have any questions, ask your nurse or doctor.        aspirin EC 81 MG tablet Take 81 mg by mouth daily.   DULoxetine 30 MG capsule Commonly known as: CYMBALTA Take 1 capsule by mouth once daily   Eliquis 5 MG Tabs tablet Generic drug: apixaban TAKE 1 TABLET BY MOUTH TWICE DAILY. NEED TO BE SEEN   fenofibrate 160 MG tablet TAKE 1 TABLET BY MOUTH ONCE DAILY FOR CHOLESTEROL AND  TRIGLYCERIDE   furosemide 20 MG tablet Commonly known as: LASIX Take 1 tablet by mouth twice daily   gabapentin 300 MG capsule Commonly known as: NEURONTIN TAKE 2 CAPSULES BY MOUTH THREE TIMES DAILY   HumaLOG KwikPen 100 UNIT/ML KwikPen Generic drug: insulin lispro INJECT 5 TO 20 UNITS SUBCUTANEOUSLY THREE TIMES DAILY WITH MEALS   HYDROcodone-acetaminophen 10-325 MG tablet Commonly known as: NORCO Take 1 tablet by mouth every 12 (twelve) hours as needed.   Insulin Degludec 200 UNIT/ML Sopn Commonly known as: [place] FlexTouch Inject 36 Units into the skin at bedtime.   insulin NPH-regular Human (70-30) 100 UNIT/ML injection Commonly known as: HumuLIN 70/30 Inject 90 Units into the skin 2 (two) times daily with a meal. What changed: how much to take   isosorbide mononitrate 30 MG 24 hr tablet Commonly known as: IMDUR TAKE 1 TABLET BY MOUTH ONCE DAILY   linaclotide 72 MCG capsule Commonly known as: Linzess Take 1 capsule (72 mcg total) by mouth daily before breakfast. What changed:   when to take this  reasons to take this   metoprolol tartrate 50 MG tablet Commonly known as: LOPRESSOR Take one tablet as needed for heart palpitations and increased Heart Rate What changed:   how much to take  how to take this  when to take this   nitroGLYCERIN 0.4 MG SL tablet Commonly known as: NITROSTAT Place 1 tablet (0.4 mg total) under the tongue every 5 (five) minutes x 3 doses as needed for chest pain.   pantoprazole 40 MG tablet Commonly known as: PROTONIX Take 1 tablet by mouth  once daily   Semaglutide (1 MG/DOSE) 2 MG/1.5ML Sopn Commonly known as: Ozempic (1 MG/DOSE) Inject 1 mg into the skin once a week.   telmisartan 40 MG tablet Commonly known as: MICARDIS TAKE 1 TABLET BY MOUTH ONCE DAILY. REPLACING LISINOPRIL       History (reviewed): Past Medical History:  Diagnosis Date  . Anxiety   . Arthritis   . CAD (coronary artery disease)    a. 2010: DES to CTO of RCA. EF 55% c. 07/2016: cath showing total occlusion within previously placed RCA stent (collaterals present), severe stenosis along LCx and OM1 (treated with 2 overlapping DES).   . Cellulitis and abscess rt groin   . Depression   . Diabetes mellitus   . Disorders of iron metabolism   . GERD (gastroesophageal reflux disease)   . Hyperlipidemia   . Hypertension   . Low serum testosterone level   . Medically noncompliant   . Myocardial infarction [facility])    Past Surgical History:  Procedure Laterality Date  . BACK SURGERY  2015   ACDF by Dr. [name]  . COLONOSCOPY N/A 10/01/2014   Dr. [name]: multiple tubular adenomas removed, colonic diverticulosis, redundant colon. next tcs advised for 09/2017. PATIENT NEEDS PROPOFOL FOR FAILED CONSCIOUS SEDATION  . CORONARY STENT INTERVENTION N/A 07/30/2016   Procedure: Coronary Stent Intervention;  Surgeon: [name], MD;  Location: [place] [facility];  Service: Cardiovascular;  Laterality: N/A;  . CORONARY STENT PLACEMENT  2000   By Dr. Olevia Perches  . EP IMPLANTABLE DEVICE N/A 05/25/2016   Procedure: Loop Recorder Insertion;  Surgeon: [name], MD;  Location: [place] [facility];  Service: Cardiovascular;  Laterality: N/A;  . ESOPHAGOGASTRODUODENOSCOPY     [facility]  . ESOPHAGOGASTRODUODENOSCOPY N/A 10/01/2014   Dr. [name]: patchy mottling/erythema and minimal polypoid appearance of gastric mucosa. bx with mild inlammation but no H.pylori  . HERNIA REPAIR  1610   umbilical  . LEFT HEART CATH AND CORONARY ANGIOGRAPHY N/A  07/30/2016   Procedure: Left Heart Cath and Coronary Angiography;  Surgeon: [name], MD;  Location: [place] [facility];  Service: Cardiovascular;  Laterality: N/A;  . LEFT HEART CATH AND CORONARY ANGIOGRAPHY N/A 01/19/2018   Procedure: LEFT HEART CATH AND CORONARY ANGIOGRAPHY;  Surgeon: [name], MD;  Location: [place] [facility];  Service: Cardiovascular;  Laterality: N/A;  . LESION REMOVAL     Lip and hand   . NECK SURGERY     Family History  Problem Relation Age of Onset  . Diabetes Father   . Valvular heart disease Father   . Arthritis Father   . Heart disease Father   . Stroke Father   . Alzheimer's disease Mother   . Hyperlipidemia Mother   . Hypertension Mother   . Arthritis Mother   . Lung cancer Mother   . Stroke Mother   . Arthritis/Rheumatoid Sister   . Diabetes Sister   . Hypertension Sister   . Hyperlipidemia Sister   . Depression Sister   . Dementia Maternal Aunt   . Dementia Maternal Uncle   . Heart disease Maternal Uncle   . Stomach cancer Paternal Uncle   . Colon cancer Neg Hx   . Liver disease Neg Hx    Social History   Socioeconomic History  . Marital status: Married    Spouse name: Not on file  . Number of children: 2  . Years of education: 63  . Highest education level: 12th grade  Occupational History  . Occupation: retired  Scientific laboratory technician  . Financial resource strain: Not hard at all  . Food insecurity    Worry: Patient refused    Inability: Patient refused  . Transportation needs    Medical: Patient refused    Non-medical: Patient refused  Tobacco Use  . Smoking status: Former Smoker    Packs/day: 0.25    Years: 51.00    Pack years: 12.75    Types: Cigarettes    Quit date: 08/14/2016    Years since quitting: 2.3  . Smokeless tobacco: Never Used  . Tobacco comment: smokes  a pack a week  Substance and Sexual Activity  . Alcohol use: No    Alcohol/week: 0.0 standard drinks  . Drug use: No  . Sexual activity: Yes   Lifestyle  . Physical activity    Days per week: Patient refused    Minutes per session: Patient refused  . Stress: Not at all  Relationships  . Social Herbalist on phone:  Patient refused    Gets together: Patient refused    Attends religious service: Patient refused    Active member of club or organization: Patient refused    Attends meetings of clubs or organizations: Patient refused    Relationship status: Patient refused  Other Topics Concern  . Not on file  Social History Narrative   Lives with his wife.  Retired.  Unable to afford medicines.      Activities of Daily Living In your present state of health, do you have any difficulty performing the following activities: 01/04/2019 01/18/2018  Hearing? [place]? Y -  Difficulty concentrating or making decisions? Y -  Walking or climbing stairs? Y -  Dressing or bathing? N -  Doing errands, shopping? N N  Preparing Food and eating ? N -  Using the Toilet? N -  In the past six months, have you accidently leaked urine? N -  Do you have problems with loss of bowel control? N -  Managing your Medications? N -  Managing your Finances? N -  Housekeeping or managing your Housekeeping? N -  Some recent data might be hidden    Patient Education/ Literacy How often do you need to have someone help you when you read instructions, pamphlets, or other written materials from your doctor or pharmacy?: 1 - Never What is the last grade level you completed in school?: 12th Grade  Exercise Current Exercise Habits: The patient does not participate in regular exercise at present, Exercise limited by: orthopedic condition(s)  Diet Patient reports consuming 3 meals a day and 2 snack(s) a day Patient reports that his primary diet is: Regular Patient reports that she does have regular access to food.   Depression Screen PHQ 2/9 Scores 01/04/2019 12/22/2018 07/12/2018 04/21/2018 03/14/2018 01/25/2018 12/22/2017  PHQ - 2 Score 0 0 6 0 0  0 0  PHQ- 9 Score - - 17 - - - -     Fall Risk Fall Risk  01/04/2019 12/22/2018 07/12/2018 04/21/2018 03/31/2018  Falls in the past year? 1 0 1 0 1  Number falls in past yr: 1 - 1 - 0  Injury with Fall? 0 - 0 - 0  Risk Factor Category  - - - - -  Risk for fall due to : History of fall(s);Impaired balance/gait - - - -  Follow up - - - - -     Objective:  Urbano A Filice seemed alert and oriented and he participated appropriately during our telephone visit.  Blood Pressure Weight BMI  BP Readings from Last 3 Encounters:  12/22/18 134/79  07/12/18 131/81  07/05/18 (!) 169/76   Wt Readings from Last 3 Encounters:  01/04/19 298 lb 1 oz (135.2 kg)  12/22/18 298 lb (135.2 kg)  07/12/18 293 lb 6.4 oz (133.1 kg)   BMI Readings from Last 1 Encounters:  01/04/19 37.26 kg/m    *Unable to obtain current vital signs, weight, and BMI due to telephone visit type  Hearing/Vision  . Omarr did  seem to have difficulty with hearing/understanding during the telephone conversation . Reports that he has not had a formal eye exam by an eye care professional within the past year . Reports that he has not had a formal hearing evaluation within the past year *Unable to fully assess hearing and vision during telephone visit type  Cognitive Function: 6CIT Screen 01/04/2019  What Year? 0 points  What month? 0 points  What time? 0 points  Count  back from 20 0 points  Months in reverse 0 points  Repeat phrase 2 points  Total Score 2   (Normal:0-7, Significant for Dysfunction: >8)  Normal Cognitive Function Screening: Yes   Immunization & Health Maintenance Record Immunization History  Administered Date(s) Administered  . Influenza,inj,Quad PF,6+ Mos 02/26/2016  . Tdap 09/17/2015    Health Maintenance  Topic Date Due  . OPHTHALMOLOGY EXAM  03/16/2018  . FOOT EXAM  07/12/2018  . INFLUENZA VACCINE  10/06/2019 (Originally 12/17/2018)  . PNA vac Low Risk Adult (1 of 2 - PCV13) 09/29/2026 (Originally  10/12/2015)  . HEMOGLOBIN A1C  05/02/2019  . COLONOSCOPY  09/30/2024  . TETANUS/TDAP  09/16/2025  . Hepatitis C Screening  Completed       Assessment  This is a routine wellness examination for Alanzo A Masella.  Health Maintenance: Due or Overdue Health Maintenance Due  Topic Date Due  . OPHTHALMOLOGY EXAM  03/16/2018  . FOOT EXAM  07/12/2018    [name] [name] does not need a referral for Community Assistance: Care Management:   no Social Work:    no Prescription Assistance:  no Nutrition/Diabetes Education:  no   Plan:  Personalized Goals Goals Addressed   None    Personalized Health Maintenance & Screening Recommendations  Pneumococcal vaccine  Influenza vaccine Diabetes screening Glaucoma screening  Lung Cancer Screening Recommended: no (Low Dose CT Chest recommended if Age 7-80 years, 30 pack-year currently smoking OR have quit w/in past 15 years) Hepatitis C Screening recommended: no HIV Screening recommended: no  Advanced Directives: Written information was not prepared per patient's request.  Referrals & Orders No orders of the defined types were placed in this encounter.   Follow-up Plan . Follow-up with [name], FNP as planned    I have personally reviewed and noted the following in the patient's chart:   . Medical and social history . Use of alcohol, tobacco or illicit drugs  . Current medications and supplements . Functional ability and status . Nutritional status . Physical activity . Advanced directives . List of other physicians . Hospitalizations, surgeries, and ER visits in previous 12 months . Vitals . Screenings to include cognitive, depression, and falls . Referrals and appointments  In addition, I have reviewed and discussed with Miguelangel A Gieske certain preventive protocols, quality metrics, and best practice recommendations. A written personalized care plan for preventive services as well as general preventive health  recommendations is available and can be mailed to the patient at his request.      Wardell Heath, LPN  07/16/6008

## 2019-01-04 NOTE — Patient Instructions (Signed)
[name] [name] , Thank you for taking time to come for your Medicare Wellness Visit. I appreciate your ongoing commitment to your health goals. Please review the following plan we discussed and let me know if I can assist you in the future.   These are the goals we discussed: Goals    . DIET - EAT MORE FRUITS AND VEGETABLES    . Eat more fruits and vegetables    . Increase physical activity    . Plan meals       This is a list of the screening recommended for you and due dates:  Health Maintenance  Topic Date Due  . Eye exam for diabetics  03/16/2018  . Complete foot exam   07/12/2018  . Flu Shot  10/06/2019*  . Pneumonia vaccines (1 of 2 - PCV13) 09/29/2026*  . Hemoglobin A1C  05/02/2019  . Colon Cancer Screening  09/30/2024  . Tetanus Vaccine  09/16/2025  .  Hepatitis C: One time screening is recommended by Center for Disease Control  (CDC) for  adults born from [birth year] through 1965.   Completed  *Topic was postponed. The date shown is not the original due date.     BMI for Adults  Body mass index (BMI) is a number that is calculated from a person's weight and height. BMI may help to estimate how much of a person's weight is composed of fat. BMI can help identify those who may be at higher risk for certain medical problems. How is BMI used with adults? BMI is used as a screening tool to identify possible weight problems. It is used to check whether a person is obese, overweight, healthy weight, or underweight. How is BMI calculated? BMI measures your weight and compares it to your height. This can be done either in [place] ([place] or metric measurements. Note that charts are available to help you find your BMI quickly and easily without having to do these calculations yourself. To calculate your BMI in English (U.S.) measurements, your health care provider will: 1. Measure your weight in pounds (lb). 2. Multiply the number of pounds by 703. ? For example, for a person who weighs 180  lb, multiply that number by 703, which equals 126,540. 3. Measure your height in inches (in). Then multiply that number by itself to get a measurement called "inches squared." ? For example, for a person who is 70 in tall, the "inches squared" measurement is 70 in x 70 in, which equals 4900 inches squared. 4. Divide the total from Step 2 (number of lb x 703) by the total from Step 3 (inches squared): 126,540  4900 = 25.8. This is your BMI. To calculate your BMI in metric measurements, your health care provider will: 1. Measure your weight in kilograms (kg). 2. Measure your height in meters (m). Then multiply that number by itself to get a measurement called "meters squared." ? For example, for a person who is 1.75 m tall, the "meters squared" measurement is 1.75 m x 1.75 m, which is equal to 3.1 meters squared. 3. Divide the number of kilograms (your weight) by the meters squared number. In this example: 70  3.1 = 22.6. This is your BMI. How is BMI interpreted? To interpret your results, your health care provider will use BMI charts to identify whether you are underweight, normal weight, overweight, or obese. The following guidelines will be used:  Underweight: BMI less than 18.5.  Normal weight: BMI between 18.5 and 24.9.  Overweight:  BMI between 25 and 29.9.  Obese: BMI of 30 and above. Please note:  Weight includes both fat and muscle, so someone with a muscular build, such as an athlete, may have a BMI that is higher than 24.9. In cases like these, BMI is not an accurate measure of body fat.  To determine if excess body fat is the cause of a BMI of 25 or higher, further assessments may need to be done by a health care provider.  BMI is usually interpreted in the same way for men and women. Why is BMI a useful tool? BMI is useful in two ways:  Identifying a weight problem that may be related to a medical condition, or that may increase the risk for medical problems.  Promoting  lifestyle and diet changes in order to reach a healthy weight. Summary  Body mass index (BMI) is a number that is calculated from a person's weight and height.  BMI may help to estimate how much of a person's weight is composed of fat. BMI can help identify those who may be at higher risk for certain medical problems.  BMI can be measured using English measurements or metric measurements.  To interpret your results, your health care provider will use BMI charts to identify whether you are underweight, normal weight, overweight, or obese. This information is not intended to replace advice given to you by your health care provider. Make sure you discuss any questions you have with your health care provider. Document Released: 01/14/2004 Document Revised: 04/16/2017 Document Reviewed: 03/17/2017 Elsevier Patient Education  2020 [employer].

## 2019-01-05 ENCOUNTER — Telehealth: Payer: Self-pay | Admitting: Student

## 2019-01-05 NOTE — Telephone Encounter (Signed)
Virtual Visit Pre-Appointment Phone Call  "(Name), I am calling you today to discuss your upcoming appointment. We are currently trying to limit exposure to the virus that causes COVID-19 by seeing patients at home rather than in the office."  1. "What is the BEST phone number to call the day of the visit?" - include this in appointment notes  2. Do you have or have access to (through a family member/friend) a smartphone with video capability that we can use for your visit?" a. If yes - list this number in appt notes as cell (if different from BEST phone #) and list the appointment type as a VIDEO visit in appointment notes b. If no - list the appointment type as a PHONE visit in appointment notes  3. Confirm consent - "In the setting of the current Covid19 crisis, you are scheduled for a (phone or video) visit with your provider on (date) at (time).  Just as we do with many in-office visits, in order for you to participate in this visit, we must obtain consent.  If you'd like, I can send this to your mychart (if signed up) or email for you to review.  Otherwise, I can obtain your verbal consent now.  All virtual visits are billed to your insurance company just like a normal visit would be.  By agreeing to a virtual visit, we'd like you to understand that the technology does not allow for your provider to perform an examination, and thus may limit your provider's ability to fully assess your condition. If your provider identifies any concerns that need to be evaluated in person, we will make arrangements to do so.  Finally, though the technology is pretty good, we cannot assure that it will always work on either your or our end, and in the setting of a video visit, we may have to convert it to a phone-only visit.  In either situation, we cannot ensure that we have a secure connection.  Are you willing to proceed?" STAFF: Did the patient verbally acknowledge consent to telehealth visit? Document  YES/NO here: Yes  4. Advise patient to be prepared - "Two hours prior to your appointment, go ahead and check your blood pressure, pulse, oxygen saturation, and your weight (if you have the equipment to check those) and write them all down. When your visit starts, your provider will ask you for this information. If you have an Apple Watch or Kardia device, please plan to have heart rate information ready on the day of your appointment. Please have a pen and paper handy nearby the day of the visit as well."  5. Give patient instructions for MyChart download to smartphone OR [email or web address] as below if video visit (depending on what platform provider is using)  6. Inform patient they will receive a phone call 15 minutes prior to their appointment time (may be from unknown caller ID) so they should be prepared to answer    TELEPHONE CALL NOTE  Yorel A Heffern has been deemed a candidate for a follow-up tele-health visit to limit community exposure during the Covid-19 pandemic. I spoke with the patient via phone to ensure availability of phone/video source, confirm preferred email & phone number, and discuss instructions and expectations.  I reminded Horacio A Moat to be prepared with any vital sign and/or heart rhythm information that could potentially be obtained via home monitoring, at the time of his visit. I reminded Kymir Ellouise Newer to expect a phone call prior to  his visit.  [place] Goins 01/05/2019 10:07 AM

## 2019-01-10 NOTE — Progress Notes (Signed)
Carelink Summary Report / Loop Recorder 

## 2019-01-11 ENCOUNTER — Other Ambulatory Visit: Payer: Self-pay | Admitting: Family

## 2019-01-11 DIAGNOSIS — I2510 Atherosclerotic heart disease of native coronary artery without angina pectoris: Secondary | ICD-10-CM

## 2019-01-12 ENCOUNTER — Encounter: Payer: Self-pay | Admitting: Student

## 2019-01-12 ENCOUNTER — Other Ambulatory Visit: Payer: Self-pay

## 2019-01-12 ENCOUNTER — Telehealth: Payer: Self-pay | Admitting: *Deleted

## 2019-01-12 ENCOUNTER — Telehealth (INDEPENDENT_AMBULATORY_CARE_PROVIDER_SITE_OTHER): Payer: Medicare Other | Admitting: Student

## 2019-01-12 VITALS — BP 141/60 | HR 75 | Ht 74.0 in | Wt 297.0 lb

## 2019-01-12 DIAGNOSIS — Z006 Encounter for examination for normal comparison and control in clinical research program: Secondary | ICD-10-CM

## 2019-01-12 DIAGNOSIS — E785 Hyperlipidemia, unspecified: Secondary | ICD-10-CM

## 2019-01-12 DIAGNOSIS — I2510 Atherosclerotic heart disease of native coronary artery without angina pectoris: Secondary | ICD-10-CM | POA: Diagnosis not present

## 2019-01-12 DIAGNOSIS — IMO0001 Reserved for inherently not codable concepts without codable children: Secondary | ICD-10-CM

## 2019-01-12 DIAGNOSIS — I480 Paroxysmal atrial fibrillation: Secondary | ICD-10-CM

## 2019-01-12 DIAGNOSIS — I10 Essential (primary) hypertension: Secondary | ICD-10-CM

## 2019-01-12 MED ORDER — METOPROLOL SUCCINATE ER 50 MG PO TB24: 50.0000 mg | ORAL_TABLET | Freq: Every day | ORAL | 11 refills | Status: DC

## 2019-01-12 NOTE — Telephone Encounter (Signed)
Spoke with patient for visit [id number] in the clear research study.  No cos, aes or saes to report.  Next unscheduled visit scheduled for labs and vitals and next regular clinic visit scheduled.

## 2019-01-12 NOTE — Progress Notes (Signed)
Virtual Visit via Telephone Note   This visit type was conducted due to national recommendations for restrictions regarding the COVID-19 Pandemic (e.g. social distancing) in an effort to limit this patient's exposure and mitigate transmission in our community.  Due to his co-morbid illnesses, this patient is at least at moderate risk for complications without adequate follow up.  This format is felt to be most appropriate for this patient at this time.  The patient did not have access to video technology/had technical difficulties with video requiring transitioning to audio format only (telephone).  All issues noted in this document were discussed and addressed.  No physical exam could be performed with this format.  Please refer to the patient's chart for his  consent to telehealth for [facility].   Date:  01/12/2019   ID:  [name] [name], DOB 01/16/1951, MRN [id number]  Patient Location: Home Provider Location: Office  PCP:  [name], FNP  Cardiologist:  Carlyle Dolly, MD --> patient requests to switch to Dr. [name] Electrophysiologist:  Cristopher Peru, MD   Evaluation Performed:  Follow-Up Visit  Chief Complaint: Overdue Follow-up  History of Present Illness:    [name] [name] is a 68 y.o. male with past medical history of CAD (s/p DES to RCA in 2010, cath in 07/2016 showing CTO of RCA with collaterals, did receive DESx2 to LCx and OM1, repeat cath in 01/2018 showing patent stents along LCx and OM with CTO of D2, CTO of distal LCx, and CTO of RCA with collaterals present overall unchanged since 2018 with medical management recommended), paroxysmal atrial fibrillation, HTN, HLD (intolerant to statins and Repatha), IDDM, and prior CVA who presents to the office today for overdue follow-up.   He was examined by Dr. Lovena Le in 05/2018 and reported having chronic back and knee pain but denied any recent chest pain or dyspnea.  No changes were made to his medication regimen and he was  continued on ASA, Eliquis, Lasix 20 mg BID, Fenofibrate, Imdur 60mg  daily and Telmisartan 40mg  daily. He had been discharged on Plavix in 01/2018 but this was not listed on his medication regimen at that time.  By review of notes, he called the office in 10/2018 reporting an episode of chest tightness, dyspnea, and dizziness overnight.  A remote transmission was submitted and showed atrial fibrillation with PVC's. A Virtual Visit was offered but the patient declined at that time.   In talking with the patient today, he reports overall doing well from a cardiac perspective since his last visit.  He denies any recent chest pain, dyspnea on exertion, orthopnea, PND, or lower extremity edema.  He does experience intermittent palpitations and has been taking Lopressor 50 mg on a daily basis.  Says that his episode in 10/2018 resembled his prior episodes of atrial fibrillation.  He was previously on Plavix and Eliquis but said he self discontinued Plavix several months ago due to issues with easy bruising and bleeding along his upper extremities.  This has improved since switching from Plavix to aspirin.  He remains enrolled in the CLEAR Study but has not been to recent visits given that this was postponed due to COVID-19.  He is not overly active at baseline secondary to chronic back pain but does walk in the yard and to the mailbox.  The patient does not have symptoms concerning for COVID-19 infection (fever, chills, cough, or new shortness of breath).    Past Medical History:  Diagnosis Date  . Anxiety   .  Arthritis   . CAD (coronary artery disease)    a. 2010: DES to CTO of RCA. EF 55% b. 07/2016: cath showing total occlusion within previously placed RCA stent (collaterals present), severe stenosis along LCx and OM1 (treated with 2 overlapping DES). c. repeat cath in 01/2018 showing patent stents along LCx and OM with CTO of D2, CTO of distal LCx, and CTO of RCA with collaterals present overall  unchanged since 2018 with medical management recom  . Cellulitis and abscess rt groin   . Depression   . Diabetes mellitus   . Disorders of iron metabolism   . GERD (gastroesophageal reflux disease)   . Hyperlipidemia   . Hypertension   . Low serum testosterone level   . Medically noncompliant   . Myocardial infarction [facility])    Past Surgical History:  Procedure Laterality Date  . BACK SURGERY  2015   ACDF by Dr. [name]  . COLONOSCOPY N/A 10/01/2014   Dr. [name]: multiple tubular adenomas removed, colonic diverticulosis, redundant colon. next tcs advised for 09/2017. PATIENT NEEDS PROPOFOL FOR FAILED CONSCIOUS SEDATION  . CORONARY STENT INTERVENTION N/A 07/30/2016   Procedure: Coronary Stent Intervention;  Surgeon: [name], MD;  Location: [place] [facility];  Service: Cardiovascular;  Laterality: N/A;  . CORONARY STENT PLACEMENT  2000   By Dr. Olevia Perches  . EP IMPLANTABLE DEVICE N/A 05/25/2016   Procedure: Loop Recorder Insertion;  Surgeon: [name], MD;  Location: [place] [facility];  Service: Cardiovascular;  Laterality: N/A;  . ESOPHAGOGASTRODUODENOSCOPY     esophagus stretched remotely at [facility]  . ESOPHAGOGASTRODUODENOSCOPY N/A 10/01/2014   Dr. [name]: patchy mottling/erythema and minimal polypoid appearance of gastric mucosa. bx with mild inlammation but no H.pylori  . HERNIA REPAIR  [id number]   umbilical  . LEFT HEART CATH AND CORONARY ANGIOGRAPHY N/A 07/30/2016   Procedure: Left Heart Cath and Coronary Angiography;  Surgeon: [name], MD;  Location: [place] [facility];  Service: Cardiovascular;  Laterality: N/A;  . LEFT HEART CATH AND CORONARY ANGIOGRAPHY N/A 01/19/2018   Procedure: LEFT HEART CATH AND CORONARY ANGIOGRAPHY;  Surgeon: [name], MD;  Location: [place] [facility];  Service: Cardiovascular;  Laterality: N/A;  . LESION REMOVAL     Lip and hand   . NECK SURGERY       Current Meds  Medication Sig  . aspirin EC 81 MG tablet Take 81 mg by mouth daily.  .  DULoxetine (CYMBALTA) 30 MG capsule Take 1 capsule by mouth once daily  . ELIQUIS 5 MG TABS tablet TAKE 1 TABLET BY MOUTH TWICE DAILY. NEED TO BE SEEN  . fenofibrate 160 MG tablet TAKE 1 TABLET BY MOUTH ONCE DAILY FOR CHOLESTEROL AND  TRIGLYCERIDE  . furosemide (LASIX) 20 MG tablet Take 1 tablet by mouth twice daily (Patient taking differently: 20 mg every other day. )  . gabapentin (NEURONTIN) 300 MG capsule TAKE 2 CAPSULES BY MOUTH THREE TIMES DAILY  . HUMALOG KWIKPEN 100 UNIT/ML KwikPen INJECT 5 TO 20 UNITS SUBCUTANEOUSLY THREE TIMES DAILY WITH MEALS  . HYDROcodone-acetaminophen (NORCO) 10-325 MG tablet Take 1 tablet by mouth every 12 (twelve) hours as needed.  . Insulin Degludec (TRESIBA FLEXTOUCH) 200 UNIT/ML SOPN Inject 36 Units into the skin at bedtime.  . insulin NPH-regular Human (HUMULIN 70/30) (70-30) 100 UNIT/ML injection Inject 90 Units into the skin 2 (two) times daily with a meal. (Patient taking differently: Inject 100 Units into the skin 2 (two) times daily with a  meal. )  . isosorbide mononitrate (IMDUR) 30 MG 24 hr tablet Take 1 tablet by mouth once daily  . nitroGLYCERIN (NITROSTAT) 0.4 MG SL tablet Place 1 tablet (0.4 mg total) under the tongue every 5 (five) minutes x 3 doses as needed for chest pain.  Marland Kitchen OZEMPIC, 1 MG/DOSE, 2 MG/1.5ML SOPN INJECT 1MG  SUBQ ONCE A WEEK  . pantoprazole (PROTONIX) 40 MG tablet Take 1 tablet by mouth once daily  . telmisartan (MICARDIS) 40 MG tablet TAKE 1 TABLET BY MOUTH ONCE DAILY. REPLACING LISINOPRIL  . [DISCONTINUED] metoprolol tartrate (LOPRESSOR) 50 MG tablet Take one tablet as needed for heart palpitations and increased Heart Rate (Patient taking differently: Take 50 mg by mouth daily. Take one tablet as needed for heart palpitations and increased Heart Rate)     Allergies:   Shellfish allergy, Sulfa antibiotics, Ace inhibitors, Invokana [canagliflozin], Metformin and related, Pravastatin sodium, Fenofibrate, Iodine, Lisinopril, Livalo  [pitavastatin], Repatha [evolocumab], Crestor [rosuvastatin], Horse-derived products, Lexapro [escitalopram oxalate], Lipitor [atorvastatin], and Tape   Social History   Tobacco Use  . Smoking status: Former Smoker    Packs/day: 0.25    Years: 51.00    Pack years: 12.75    Types: Cigarettes    Quit date: 08/14/2016    Years since quitting: 2.4  . Smokeless tobacco: Never Used  . Tobacco comment: smokes  a pack a week  Substance Use Topics  . Alcohol use: No    Alcohol/week: 0.0 standard drinks  . Drug use: No     Family Hx: The patient's family history includes Alzheimer's disease in his mother; Arthritis in his father and mother; Arthritis/Rheumatoid in his sister; Dementia in his maternal aunt and maternal uncle; Depression in his sister; Diabetes in his father and sister; Heart disease in his father and maternal uncle; Hyperlipidemia in his mother and sister; Hypertension in his mother and sister; Lung cancer in his mother; Stomach cancer in his paternal uncle; Stroke in his father and mother; Valvular heart disease in his father. There is no history of Colon cancer or Liver disease.  ROS:   Please see the history of present illness.     All other systems reviewed and are negative.   Prior CV studies:   The following studies were reviewed today:  Cardiac Catheterization: 01/2018  Prox LAD lesion is 40% stenosed.  Ost 1st Diag lesion is 40% stenosed.  Ost 2nd Diag to 2nd Diag lesion is 100% stenosed.  Mid Cx to Dist Cx lesion is 100% stenosed.  Prox RCA to Mid RCA lesion is 90% stenosed.  Prox RCA lesion is 99% stenosed.  Previously placed Ost 1st Mrg to 1st Mrg stent (unknown type) is widely patent.  Previously placed Prox Cx stent (unknown type) is widely patent.   Significant three-vessel coronary obstructive disease with previously noted 40% proximal LAD stenosis, 40% stenosis in the inferior branch of the first diagonal vessel with total occlusion of the second  diagonal vessel with retrograde collateralization to this diagonal vessel;   Small normal ramus intermediate vessel;  Widely patent stent extending from the proximal circumflex into the OM1 vessel without stenosis, with old total occlusion of the mid distal circumflex after the second marginal vessel which is collateralized from the LAD and RCA;  Old subtotal/ total mid RCA stenosis at the site of prior stenting with extensive left-to-right collaterals as well as antegrade flow distally with competitive filling due to collateral flow and collateralization from the SA nodal artery supplying the distal circumflex.  LVEDP 15 mm Hg.   RECOMMENDATION: Increase medical therapy.  Present catheterization is essentially unchanged from the prior study of March 2018.  Aggressive lipid-lowering therapy with target LDL less than 70. oPTIMAL blood pressure control with target blood pressure less than 130/80. consider evaluation for sleep apnea in this patient with atrial fibrillation.  Recommend to resume Apixaban, at currently prescribed dose and frequency, on 01/20/18.  Recommend concurrent antiplatelet therapy of Clopidogrel 75mg  daily.  Labs/Other Tests and Data Reviewed:    EKG:  No ECG reviewed.  Recent Labs: 01/19/2018: TSH 3.093 01/20/2018: Magnesium 2.2 10/31/2018: Hemoglobin 15.0; Platelets 310 12/22/2018: ALT 31; BUN 23; Creatinine, Ser 1.45; Potassium 4.6; Sodium 136   Recent Lipid Panel Lab Results  Component Value Date/Time   CHOL 242 (H) 10/31/2018 09:41 AM   TRIG 999 (HH) 10/31/2018 09:41 AM   HDL 16 (L) 10/31/2018 09:41 AM   CHOLHDL 15.1 (H) 10/31/2018 09:41 AM   CHOLHDL 8.5 07/31/2016 02:58 AM   LDLCALC Comment 10/31/2018 09:41 AM   LDLDIRECT 172 (H) 11/08/2014 09:08 AM    Wt Readings from Last 3 Encounters:  01/12/19 297 lb (134.7 kg)  01/04/19 298 lb 1 oz (135.2 kg)  12/22/18 298 lb (135.2 kg)     Objective:    Vital Signs:  BP (!) 141/60   Pulse 75   Ht 6\' 2"  (1.88  m)   Wt 297 lb (134.7 kg)   BMI 38.13 kg/m    General: Pleasant male sounding in NAD Psych: Normal affect. Neuro: Alert and oriented X 3. Lungs:  Resp regular and unlabored while talking on the phone.   ASSESSMENT & PLAN:    1. CAD - he is s/p DES to RCA in 2010 with cath in 07/2016 showing CTO of RCA with collaterals and he did receive DESx2 to LCx and OM1. Most recent cath in 01/2018 showed patent stents along LCx and OM with CTO of D2, CTO of distal LCx, and CTO of RCA with collaterals present overall unchanged since 2018. - he denies any recent chest pain and breathing has been at baseline. Continue with medical management including ASA, Imdur, and BB therapy. He previously self-discontinued Plavix and replaced this with ASA due to easy bruising. Reviewed Plavix is stronger than ASA and this is preferred given his significant CAD but he wishes to remain on ASA due to concurrent use of Eliquis.   2. Paroxysmal Atrial Fibrillation - he does report intermittent episodes of palpitations which have also been captured on his ILR. He is currently taking Lopressor 50mg  daily. Will convert to Toprol-XL 50mg  daily for sustained release throughout the day and hopefully this will help with his symptoms as they typically occur at night. Reviewed he can use short-acting Lopressor if needed for persistent symptoms but would check BP before use. Continue Eliquis for anticoagulation.   3. HTN - BP has been variable at home as SBP was in the 80's earlier this year per his report. At 141/60 during today's visit. Was encouraged to keep a BP log. Continue Micardis and Imdur with transition from Lopressor to Toprol-XL as outlined above.   4. HLD - FLP in 10/2018 showed total cholesterol of 242, HDL 16, Triglycerides 999, and LDL unable to be calculated. He has previously been intolerant to multiple stains and Repatha. Currently enrolled in the Clear Trial but the study was delayed this year due to COVID-19. If  no significant improvement with the Clear Trial, would refer to the Lipid Clinic with Dr.  Hilty.   5. IDDM - followed by PCP. Hgb A1c improved to 7.2 on most recent check in 10/2018.   COVID-19 Education: The signs and symptoms of COVID-19 were discussed with the patient and how to seek care for testing (follow up with PCP or arrange E-visit).  The importance of social distancing was discussed today.  Time:   Today, I have spent 32 minutes with the patient with telehealth technology discussing the above problems.     Medication Adjustments/Labs and Tests Ordered: Current medicines are reviewed at length with the patient today.  Concerns regarding medicines are outlined above.   Tests Ordered: No orders of the defined types were placed in this encounter.   Medication Changes: Meds ordered this encounter  Medications  . metoprolol succinate (TOPROL-XL) 50 MG 24 hr tablet    Sig: Take 1 tablet (50 mg total) by mouth daily.    Dispense:  30 tablet    Refill:  11    Follow Up:  In Person in 6 month(s)  Signed, Erma Heritage, PA-C  01/12/2019 8:04 PM    [facility]

## 2019-01-12 NOTE — Patient Instructions (Addendum)
Medication Instructions:  Your physician has recommended you make the following change in your medication:  Stop Taking Lopressor   Start Taking Toprol XL 50 mg Daily   If you need a refill on your cardiac medications before your next appointment, please call your pharmacy.   Lab work: NONE   If you have labs (blood work) drawn today and your tests are completely normal, you will receive your results only by: Marland Kitchen MyChart Message (if you have MyChart) OR . A paper copy in the mail If you have any lab test that is abnormal or we need to change your treatment, we will call you to review the results.  Testing/Procedures: NONE  Follow-Up: At [facility], you and your health needs are our priority.  As part of our continuing mission to provide you with exceptional heart care, we have created designated Provider Care Teams.  These Care Teams include your primary Cardiologist (physician) and Advanced Practice Providers (APPs -  Physician Assistants and Nurse Practitioners) who all work together to provide you with the care you need, when you need it. You will need a follow up appointment in 6 months.  Please call our office 2 months in advance to schedule this appointment.  You may see Dr. [name] or one of the following Advanced Practice Providers on your designated Care Team:   Bernerd Pho, PA-C [facility] . Ermalinda Barrios, PA-C ([place])  Any Other Special Instructions Will Be Listed Below (If Applicable). Thank you for choosing [facility]!     Heart-Healthy Eating Plan Heart-healthy meal planning includes:  Eating less unhealthy fats.  Eating more healthy fats.  Making other changes in your diet.  What are tips for following this plan? Cooking Avoid frying your food. Try to bake, boil, grill, or broil it instead. You can also reduce fat by:  Removing the skin from poultry.  Removing all visible fats from meats.  Steaming vegetables in  water or broth. Meal planning   At meals, divide your plate into four equal parts: ? Fill one-half of your plate with vegetables and green salads. ? Fill one-fourth of your plate with whole grains. ? Fill one-fourth of your plate with lean protein foods.  Eat 4-5 servings of vegetables per day. A serving of vegetables is: ? 1 cup of raw or cooked vegetables. ? 2 cups of raw leafy greens.  Eat 4-5 servings of fruit per day. A serving of fruit is: ? 1 medium whole fruit. ?  cup of dried fruit. ?  cup of fresh, frozen, or canned fruit. ?  cup of 100% fruit juice.  Eat more foods that have soluble fiber. These are apples, broccoli, carrots, beans, peas, and barley. Try to get 20-30 g of fiber per day.  Eat 4-5 servings of nuts, legumes, and seeds per week: ? 1 serving of dried beans or legumes equals  cup after being cooked. ? 1 serving of nuts is  cup. ? 1 serving of seeds equals 1 tablespoon. General information  Eat more home-cooked food. Eat less restaurant, buffet, and fast food.  Limit or avoid alcohol.  Limit foods that are high in starch and sugar.  Avoid fried foods.  Lose weight if you are overweight.  Keep track of how much salt (sodium) you eat. This is important if you have high blood pressure. Ask your doctor to tell you more about this.  Try to add vegetarian meals each week. Fats  Choose healthy fats. These include olive  oil and canola oil, flaxseeds, walnuts, almonds, and seeds.  Eat more omega-3 fats. These include salmon, mackerel, sardines, tuna, flaxseed oil, and ground flaxseeds. Try to eat fish at least 2 times each week.  Check food labels. Avoid foods with trans fats or high amounts of saturated fat.  Limit saturated fats. ? These are often found in animal products, such as meats, butter, and cream. ? These are also found in plant foods, such as palm oil, palm kernel oil, and coconut oil.  Avoid foods with partially hydrogenated oils in  them. These have trans fats. Examples are stick margarine, some tub margarines, cookies, crackers, and other baked goods. What foods can I eat? Fruits All fresh, canned (in natural juice), or frozen fruits. Vegetables Fresh or frozen vegetables (raw, steamed, roasted, or grilled). Green salads. Grains Most grains. Choose whole wheat and whole grains most of the time. Rice and pasta, including brown rice and pastas made with whole wheat. Meats and other proteins Lean, well-trimmed beef, veal, pork, and lamb. Chicken and [place] without skin. All fish and shellfish. Wild duck, rabbit, pheasant, and venison. Egg whites or low-cholesterol egg substitutes. Dried beans, peas, lentils, and tofu. Seeds and most nuts. Dairy Low-fat or nonfat cheeses, including ricotta and mozzarella. Skim or 1% milk that is liquid, powdered, or evaporated. Buttermilk that is made with low-fat milk. Nonfat or low-fat yogurt. Fats and oils Non-hydrogenated (trans-free) margarines. Vegetable oils, including soybean, sesame, sunflower, olive, peanut, safflower, corn, canola, and cottonseed. Salad dressings or mayonnaise made with a vegetable oil. Beverages Mineral water. Coffee and tea. Diet carbonated beverages. Sweets and desserts Sherbet, gelatin, and fruit ice. Small amounts of dark chocolate. Limit all sweets and desserts. Seasonings and condiments All seasonings and condiments. The items listed above may not be a complete list of foods and drinks you can eat. Contact a dietitian for more options. What foods should I avoid? Fruits Canned fruit in heavy syrup. Fruit in cream or butter sauce. Fried fruit. Limit coconut. Vegetables Vegetables cooked in cheese, cream, or butter sauce. Fried vegetables. Grains Breads that are made with saturated or trans fats, oils, or whole milk. Croissants. Sweet rolls. Donuts. High-fat crackers, such as cheese crackers. Meats and other proteins Fatty meats, such as hot dogs, ribs,  sausage, bacon, rib-eye roast or steak. High-fat deli meats, such as salami and bologna. Caviar. Domestic duck and goose. Organ meats, such as liver. Dairy Cream, sour cream, cream cheese, and creamed cottage cheese. Whole-milk cheeses. Whole or 2% milk that is liquid, evaporated, or condensed. Whole buttermilk. Cream sauce or high-fat cheese sauce. Yogurt that is made from whole milk. Fats and oils Meat fat, or shortening. Cocoa butter, hydrogenated oils, palm oil, coconut oil, palm kernel oil. Solid fats and shortenings, including bacon fat, salt pork, lard, and butter. Nondairy cream substitutes. Salad dressings with cheese or sour cream. Beverages Regular sodas and juice drinks with added sugar. Sweets and desserts Frosting. Pudding. Cookies. Cakes. Pies. Milk chocolate or white chocolate. Buttered syrups. Full-fat ice cream or ice cream drinks. The items listed above may not be a complete list of foods and drinks to avoid. Contact a dietitian for more information. Summary  Heart-healthy meal planning includes eating less unhealthy fats, eating more healthy fats, and making other changes in your diet.  Eat a balanced diet. This includes fruits and vegetables, low-fat or nonfat dairy, lean protein, nuts and legumes, whole grains, and heart-healthy oils and fats. This information is not intended to replace advice given  to you by your health care provider. Make sure you discuss any questions you have with your health care provider. Document Released: 11/03/2011 Document Revised: 07/08/2017 Document Reviewed: 06/11/2017 Elsevier Patient Education  2020 [employer].

## 2019-01-18 ENCOUNTER — Other Ambulatory Visit: Payer: Self-pay | Admitting: Family

## 2019-01-18 ENCOUNTER — Other Ambulatory Visit: Payer: Self-pay | Admitting: Cardiology

## 2019-01-18 DIAGNOSIS — I480 Paroxysmal atrial fibrillation: Secondary | ICD-10-CM

## 2019-01-24 ENCOUNTER — Encounter: Payer: Medicare Other | Admitting: *Deleted

## 2019-01-24 ENCOUNTER — Other Ambulatory Visit: Payer: Self-pay

## 2019-01-24 VITALS — BP 166/60 | HR 81 | Wt 300.1 lb

## 2019-01-24 DIAGNOSIS — Z006 Encounter for examination for normal comparison and control in clinical research program: Secondary | ICD-10-CM

## 2019-01-24 NOTE — Research (Signed)
Patient to research clinic for unscheduled visit to draw labs and check vital signs in the clear research study.  No cos, aes or saes to report.  Next clinic visit scheduled.  Re-consented to [place] version 7.0 12Mar2020, local 15Apr2020.  Clear Informed Consent   Subject Name: [name] [name]  Subject met inclusion and exclusion criteria.  The informed consent form, study requirements and expectations were reviewed with the subject and questions and concerns were addressed prior to the signing of the consent form.  The subject verbalized understanding of the trial requirements.  The subject agreed to participate in the Clear trial and signed the informed consent  on 08Sep2020.  The informed consent was obtained prior to performance of any protocol-specific procedures for the subject.  A copy of the signed informed consent was given to the subject and a copy was placed in the subject's medical record.   Oletta Cohn.

## 2019-01-25 ENCOUNTER — Telehealth: Payer: Self-pay

## 2019-01-25 ENCOUNTER — Other Ambulatory Visit: Payer: Self-pay | Admitting: Family

## 2019-01-25 DIAGNOSIS — I480 Paroxysmal atrial fibrillation: Secondary | ICD-10-CM

## 2019-01-25 MED ORDER — APIXABAN 5 MG PO TABS: ORAL_TABLET | ORAL | 3 refills | Status: DC

## 2019-01-25 NOTE — Telephone Encounter (Signed)
Refill sent. Tried to call pt,. No answer, no voicemail.

## 2019-01-25 NOTE — Telephone Encounter (Signed)
-----   Message from Juluis Mire, RN sent at 01/25/2019  3:09 PM EDT ----- Regarding: refill Pt needs refill on eliquis - saw b. Strader last week virtually but bottle states appt with taylor needed for refills. Pt confused if he should see taylor or if refills can be sent in with the visit with b. Strader. Pelase call pt and let him know. [phone]   Thanks Stacy RN [facility]

## 2019-02-01 ENCOUNTER — Telehealth: Payer: Self-pay | Admitting: Neurology

## 2019-02-01 NOTE — Telephone Encounter (Signed)
Can we change him to follow up with Janett Billow?

## 2019-02-01 NOTE — Telephone Encounter (Signed)
Pt states he is having headaches now and shaking, he believes it is related to stroke and not new condition.  Pt was told a message would be sent to RN to discuss with Dr Jaynee Eagles and if related he should be able to keep appointment.  Pt aware he will need referral if determined to be a new condition

## 2019-02-02 ENCOUNTER — Other Ambulatory Visit: Payer: Self-pay

## 2019-02-02 ENCOUNTER — Encounter: Payer: Self-pay | Admitting: Family

## 2019-02-02 ENCOUNTER — Ambulatory Visit (INDEPENDENT_AMBULATORY_CARE_PROVIDER_SITE_OTHER): Payer: Medicare Other | Admitting: Family

## 2019-02-02 DIAGNOSIS — R519 Headache, unspecified: Secondary | ICD-10-CM

## 2019-02-02 DIAGNOSIS — R51 Headache: Secondary | ICD-10-CM | POA: Diagnosis not present

## 2019-02-02 NOTE — Telephone Encounter (Signed)
I called the pt back after speaking with both Janett Billow NP & Dr. Jaynee Eagles. I advised the pt per providers that he needs to follow up with his primary care for these new issues of headache and shaking. He stated he can't hold a guitar pick anymore. He has a hard time trying to pick something up. Issues have been going on 6 months. He also says he has trouble with memory. He was advised to see PCP for all of these new issues for evaluation. We briefly discussed the sleep study that he had refused. He said he doesn't have trouble sleeping. He has trouble being awake. He is also claustrophobic. I advised him there may be other masks he can use that do not cover most of his face. He still declined and said he would not change his mind.   I canceled the appt with Dr. Jaynee Eagles since pt will be seeing PCP.

## 2019-02-02 NOTE — Progress Notes (Signed)
   Virtual Visit via telephone Note Due to COVID-19 pandemic this visit was conducted virtually. This visit type was conducted due to national recommendations for restrictions regarding the COVID-19 Pandemic (e.g. social distancing, sheltering in place) in an effort to limit this patient's exposure and mitigate transmission in our community. All issues noted in this document were discussed and addressed.  A physical exam was not performed with this format.  I connected with [name] [name] on 02/02/19 at 2:55 pm by telephone and verified that I am speaking with the correct person using two identifiers. [name] [name] is currently located at home and wife is currently with him during visit. The provider, [name], FNP is located in their office at time of visit.  I discussed the limitations, risks, security and privacy concerns of performing an evaluation and management service by telephone and the availability of in person appointments. I also discussed with the patient that there may be a patient responsible charge related to this service. The patient expressed understanding and agreed to proceed.   History and Present Illness:  Pt calls the office today with complaints of headaches. He states over the last three months he has had an intermitted headache almost daily. He states he has never had a headache like this and is sensitive to light and noise.   Headache  This is a new problem. The current episode started more than 1 month ago. The problem occurs intermittently. The problem has been unchanged. The pain is located in the frontal and left unilateral region. The pain does not radiate. The pain quality is not similar to prior headaches. The quality of the pain is described as sharp. The pain is at a severity of 8/10. The pain is moderate. Associated symptoms include phonophobia and photophobia. The treatment provided mild relief.      Review of Systems  Eyes: Positive for photophobia.   Neurological: Positive for headaches.     Observations/Objective: No SOB or distress noted  Assessment and Plan: 1. Intractable headache, unspecified chronicity pattern, unspecified headache type - Ambulatory referral to Neurology - CT Head Wo Contrast; Future  2. Acute nonintractable headache, unspecified headache type - Ambulatory referral to Neurology - CT Head Wo Contrast; Future  Will do CT scan and referral to Neurologists IF HA worsens or does not improve go to ED Avoid NSAIDs since he is on Eliquis Start headache journal     I discussed the assessment and treatment plan with the patient. The patient was provided an opportunity to ask questions and all were answered. The patient agreed with the plan and demonstrated an understanding of the instructions.   The patient was advised to call back or seek an in-person evaluation if the symptoms worsen or if the condition fails to improve as anticipated.  The above assessment and management plan was discussed with the patient. The patient verbalized understanding of and has agreed to the management plan. Patient is aware to call the clinic if symptoms persist or worsen. Patient is aware when to return to the clinic for a follow-up visit. Patient educated on when it is appropriate to go to the emergency department.   Time call ended:  3:25 pm   I provided 30 minutes of non-face-to-face time during this encounter.    [name], FNP

## 2019-02-06 ENCOUNTER — Ambulatory Visit (INDEPENDENT_AMBULATORY_CARE_PROVIDER_SITE_OTHER): Payer: Medicare Other | Admitting: *Deleted

## 2019-02-06 DIAGNOSIS — I639 Cerebral infarction, unspecified: Secondary | ICD-10-CM

## 2019-02-06 LAB — CUP PACEART REMOTE DEVICE CHECK
Date Time Interrogation Session: 20200919153959
Implantable Pulse Generator Implant Date: 20180108

## 2019-02-10 ENCOUNTER — Telehealth: Payer: Self-pay

## 2019-02-10 NOTE — Telephone Encounter (Signed)
Left message for patient regarding disconnected monitor.  

## 2019-02-13 NOTE — Progress Notes (Signed)
Carelink Summary Report / Loop Recorder 

## 2019-02-16 ENCOUNTER — Telehealth: Payer: Self-pay

## 2019-02-16 NOTE — Telephone Encounter (Signed)
Pt states he sent those transmissions to make sure his monitor was working. [name] [name] called to tell the pt his monitor was not working. I apologized for the misunderstanding.

## 2019-02-17 ENCOUNTER — Other Ambulatory Visit: Payer: Self-pay | Admitting: Family

## 2019-02-17 DIAGNOSIS — E1142 Type 2 diabetes mellitus with diabetic polyneuropathy: Secondary | ICD-10-CM

## 2019-02-20 ENCOUNTER — Other Ambulatory Visit: Payer: Self-pay

## 2019-02-20 ENCOUNTER — Ambulatory Visit (HOSPITAL_COMMUNITY)
Admission: RE | Admit: 2019-02-20 | Discharge: 2019-02-20 | Disposition: A | Discharge status: Home / Self Care | Payer: Medicare Other | Source: Ambulatory Visit | Attending: Family | Admitting: Family

## 2019-02-20 DIAGNOSIS — R519 Headache, unspecified: Secondary | ICD-10-CM | POA: Diagnosis not present

## 2019-02-20 DIAGNOSIS — G319 Degenerative disease of nervous system, unspecified: Secondary | ICD-10-CM | POA: Insufficient documentation

## 2019-02-22 ENCOUNTER — Other Ambulatory Visit: Payer: Self-pay | Admitting: Family

## 2019-02-22 DIAGNOSIS — F331 Major depressive disorder, recurrent, moderate: Secondary | ICD-10-CM

## 2019-02-22 DIAGNOSIS — E1142 Type 2 diabetes mellitus with diabetic polyneuropathy: Secondary | ICD-10-CM

## 2019-03-01 ENCOUNTER — Ambulatory Visit: Payer: Medicare Other | Admitting: Physician Assistant

## 2019-03-01 ENCOUNTER — Telehealth: Payer: Self-pay | Admitting: Family

## 2019-03-01 NOTE — Telephone Encounter (Signed)
Aware.   Can continue with dramamine but may need appointment if dizziness does not resolve.

## 2019-03-02 ENCOUNTER — Other Ambulatory Visit: Payer: Self-pay

## 2019-03-02 DIAGNOSIS — I739 Peripheral vascular disease, unspecified: Secondary | ICD-10-CM

## 2019-03-03 ENCOUNTER — Telehealth (HOSPITAL_COMMUNITY): Payer: Self-pay

## 2019-03-03 NOTE — Telephone Encounter (Signed)

## 2019-03-06 ENCOUNTER — Encounter: Payer: Self-pay | Admitting: *Deleted

## 2019-03-06 ENCOUNTER — Ambulatory Visit (HOSPITAL_COMMUNITY)
Admission: RE | Admit: 2019-03-06 | Discharge: 2019-03-06 | Disposition: A | Discharge status: Home / Self Care | Payer: Medicare Other | Source: Ambulatory Visit | Attending: Family | Admitting: Family

## 2019-03-06 ENCOUNTER — Other Ambulatory Visit: Payer: Self-pay | Admitting: *Deleted

## 2019-03-06 ENCOUNTER — Telehealth: Payer: Self-pay | Admitting: Emergency Medicine

## 2019-03-06 ENCOUNTER — Ambulatory Visit (INDEPENDENT_AMBULATORY_CARE_PROVIDER_SITE_OTHER): Payer: Medicare Other | Admitting: Surgery

## 2019-03-06 ENCOUNTER — Encounter: Payer: Self-pay | Admitting: Surgery

## 2019-03-06 ENCOUNTER — Other Ambulatory Visit: Payer: Self-pay

## 2019-03-06 VITALS — BP 157/91 | HR 85 | Temp 97.8°F | Resp 20 | Ht 74.0 in | Wt 299.7 lb

## 2019-03-06 DIAGNOSIS — I70213 Atherosclerosis of native arteries of extremities with intermittent claudication, bilateral legs: Secondary | ICD-10-CM

## 2019-03-06 DIAGNOSIS — I739 Peripheral vascular disease, unspecified: Secondary | ICD-10-CM | POA: Insufficient documentation

## 2019-03-06 NOTE — Telephone Encounter (Signed)
Patient reports he has had an intermittent fever, dizziness, and HA. He reports intermitant episodes of CP that last for a second and feels weak and has muscle soreness. Recommended patient be evaluated by PCP . Remote transmission to be sent and patient to contact PCP for f/u.

## 2019-03-06 NOTE — Telephone Encounter (Signed)
LMOM.  Need to assess if symptomatic with ongoing episode of AF, controlled v-rate. + Eliquis & Toprol XL 50 mg daily.

## 2019-03-06 NOTE — H&P (View-Only) (Signed)
[facility]  Patient name: [name] DEMERS MRN: [id number] DOB: [birth year] Sex: male   REQUESTING PROVIDER:    [name]   REASON FOR CONSULT:    PAD  HISTORY OF PRESENT ILLNESS:   LEVERNE [name] is a 68 y.o. male, who is referred today for evaluation of lower extremity vascular disease.  I had previously seen him in 2016 for similar problem however at that time his ABIs were higher and his symptoms were more consistent with nerve damage from his back issues.  Now, he has had a issues with progressive leg pain.  This begins with minimal activity and is somewhat alleviated with rest.  His right leg bothers him more than the left.  He does not have any open wounds or rest pain.  He quit smoking 3 years ago.  The patient has a history of coronary artery disease.  He underwent DES in 2010 in 2018.  He suffers from atrial fibrillation.  He is on Eliquis and has a loop recorder implanted.  He is statin and Repatha intolerant.  He is currently involved in a research study to address his hypercholesterolemia.  He is a diabetic.  PAST MEDICAL HISTORY    Past Medical History:  Diagnosis Date  . Anxiety   . Arthritis   . CAD (coronary artery disease)    a. 2010: DES to CTO of RCA. EF 55% b. 07/2016: cath showing total occlusion within previously placed RCA stent (collaterals present), severe stenosis along LCx and OM1 (treated with 2 overlapping DES). c. repeat cath in 01/2018 showing patent stents along LCx and OM with CTO of D2, CTO of distal LCx, and CTO of RCA with collaterals present overall unchanged since 2018 with medical management recom  . Cellulitis and abscess rt groin   . Depression   . Diabetes mellitus   . Disorders of iron metabolism   . GERD (gastroesophageal reflux disease)   . Hyperlipidemia   . Hypertension   . Low serum testosterone level   . Medically noncompliant   . Myocardial infarction [facility])       FAMILY HISTORY   Family History  Problem Relation Age of Onset  . Diabetes Father   . Valvular heart disease Father   . Arthritis Father   . Heart disease Father   . Stroke Father   . Alzheimer's disease Mother   . Hyperlipidemia Mother   . Hypertension Mother   . Arthritis Mother   . Lung cancer Mother   . Stroke Mother   . Arthritis/Rheumatoid Sister   . Diabetes Sister   . Hypertension Sister   . Hyperlipidemia Sister   . Depression Sister   . Dementia Maternal Aunt   . Dementia Maternal Uncle   . Heart disease Maternal Uncle   . Stomach cancer Paternal Uncle   . Colon cancer Neg Hx   . Liver disease Neg Hx     SOCIAL HISTORY:   Social History   Socioeconomic History  . Marital status: Married    Spouse name: Not on file  . Number of children: 2  . Years of education: 53  . Highest education level: 12th grade  Occupational History  . Occupation: retired  Scientific laboratory technician  . Financial resource strain: Not hard at all  . Food insecurity    Worry: Patient refused    Inability: Patient refused  . Transportation needs    Medical: Patient refused    Non-medical: Patient refused  Tobacco Use  . Smoking status: Former Smoker    Packs/day: 0.25    Years: 51.00    Pack years: 12.75    Types: Cigarettes    Quit date: 08/14/2016    Years since quitting: 2.5  . Smokeless tobacco: Never Used  . Tobacco comment: smokes  a pack a week  Substance and Sexual Activity  . Alcohol use: No    Alcohol/week: 0.0 standard drinks  . Drug use: No  . Sexual activity: Yes  Lifestyle  . Physical activity    Days per week: Patient refused    Minutes per session: Patient refused  . Stress: Not at all  Relationships  . Social Herbalist on phone: Patient refused    Gets together: Patient refused    Attends religious service: Patient refused    Active member of club or organization: Patient refused    Attends meetings of clubs or organizations: Patient refused     Relationship status: Patient refused  . Intimate partner violence    Fear of current or ex partner: Patient refused    Emotionally abused: Patient refused    Physically abused: Patient refused    Forced sexual activity: Patient refused  Other Topics Concern  . Not on file  Social History Narrative   Lives with his wife.  Retired.  Unable to afford medicines.      ALLERGIES:    Allergies  Allergen Reactions  . Shellfish Allergy Anaphylaxis and Other (See Comments)    Tongue swelling, hives   . Sulfa Antibiotics Anaphylaxis and Rash    Tongue swelling, hives  . Ace Inhibitors Other (See Comments) and Cough    CKD, renal failure   . Invokana [Canagliflozin] Other (See Comments)    Syncope / dehydration  . Metformin And Related Itching  . Pravastatin Sodium Other (See Comments)    myalgias  . Fenofibrate Other (See Comments)    Body aches  . Iodine Other (See Comments)    ????. PATIENT SAYS HE DID FINE WITH LAST CT WITH CONTRAST** patient had IV contrast on 06/21/2018 without pre meds without any issues. Patient had allergic reaction to Shellfish 30 years ago-not to IV contrast.   . Lisinopril Cough  . Livalo [Pitavastatin] Other (See Comments)    Myalgias   . Repatha [Evolocumab]     Myalgias, flu like sx  . Crestor [Rosuvastatin] Other (See Comments)    Myalgias   . Horse-Derived Products Rash  . Lexapro [Escitalopram Oxalate] Other (See Comments)    Buzzing in ears,headache, felt like a zombie  . Lipitor [Atorvastatin] Other (See Comments)    myalgias  . Tape Rash    CURRENT MEDICATIONS:    Current Outpatient Medications  Medication Sig Dispense Refill  . apixaban (ELIQUIS) 5 MG TABS tablet TAKE 1 TABLET BY MOUTH TWICE DAILY . 60 tablet 3  . aspirin EC 81 MG tablet Take 81 mg by mouth daily.    . DULoxetine (CYMBALTA) 30 MG capsule Take 1 capsule (30 mg total) by mouth daily. (Needs to be seen before next refill) 30 capsule 0  . fenofibrate 160 MG tablet TAKE 1  TABLET BY MOUTH ONCE DAILY FOR CHOLESTEROL AND  TRIGLYCERIDE 90 tablet 1  . furosemide (LASIX) 20 MG tablet Take 1 tablet by mouth twice daily (Patient taking differently: 20 mg every other day. ) 180 tablet 1  . gabapentin (NEURONTIN) 300 MG capsule Take 2 capsules (600 mg total) by mouth 3 (three) times  daily. (Needs to be seen before next refill) 180 capsule 0  . HUMALOG KWIKPEN 100 UNIT/ML KwikPen INJECT 5 TO 20 UNITS SUBCUTANEOUSLY THREE TIMES DAILY WITH MEALS (Need to be seen) 15 mL 0  . HYDROcodone-acetaminophen (NORCO) 10-325 MG tablet Take 1 tablet by mouth every 12 (twelve) hours as needed. 60 tablet 0  . Insulin Degludec (TRESIBA FLEXTOUCH) 200 UNIT/ML SOPN Inject 36 Units into the skin at bedtime. 12 pen 2  . insulin NPH-regular Human (HUMULIN 70/30) (70-30) 100 UNIT/ML injection Inject 90 Units into the skin 2 (two) times daily with a meal. (Patient taking differently: Inject 100 Units into the skin 2 (two) times daily with a meal. ) 60 mL 12  . isosorbide mononitrate (IMDUR) 30 MG 24 hr tablet Take 1 tablet by mouth once daily 90 tablet 1  . metoprolol succinate (TOPROL-XL) 50 MG 24 hr tablet Take 1 tablet (50 mg total) by mouth daily. 30 tablet 11  . nitroGLYCERIN (NITROSTAT) 0.4 MG SL tablet Place 1 tablet (0.4 mg total) under the tongue every 5 (five) minutes x 3 doses as needed for chest pain. 25 tablet 3  . OZEMPIC, 1 MG/DOSE, 2 MG/1.5ML SOPN INJECT 1MG  SUBQ ONCE A WEEK 12 mL 0  . pantoprazole (PROTONIX) 40 MG tablet Take 1 tablet by mouth once daily 90 tablet 1  . telmisartan (MICARDIS) 40 MG tablet TAKE 1 TABLET BY MOUTH ONCE DAILY. REPLACING LISINOPRIL 90 tablet 1   No current facility-administered medications for this visit.     REVIEW OF SYSTEMS:   [X]  denotes positive finding, [ ]  denotes negative finding Cardiac  Comments:  Chest pain or chest pressure:    Shortness of breath upon exertion: x   Short of breath when lying flat:    Irregular heart rhythm: x        Vascular    Pain in calf, thigh, or hip brought on by ambulation:    Pain in feet at night that wakes you up from your sleep:  x   Blood clot in your veins:    Leg swelling:  x       Pulmonary    Oxygen at home:    Productive cough:     Wheezing:         Neurologic    Sudden weakness in arms or legs:     Sudden numbness in arms or legs:     Sudden onset of difficulty speaking or slurred speech: x   Temporary loss of vision in one eye:     Problems with dizziness:  x       Gastrointestinal    Blood in stool:      Vomited blood:         Genitourinary    Burning when urinating:     Blood in urine:        Psychiatric    Major depression:         Hematologic    Bleeding problems:    Problems with blood clotting too easily:        Skin    Rashes or ulcers:        Constitutional    Fever or chills:     PHYSICAL EXAM:   Vitals:   03/06/19 1341  BP: (!) 157/91  Pulse: 85  Resp: 20  Temp: 97.8 F (36.6 C)  SpO2: 98%  Weight: 299 lb 11.2 oz (135.9 kg)  Height: 6\' 2"  (1.88 m)    GENERAL: The patient  is a well-nourished male, in no acute distress. The vital signs are documented above. CARDIAC: There is a regular rate and rhythm.  VASCULAR: Nonpalpable pedal pulses PULMONARY: Nonlabored respirations ABDOMEN: Soft and non-tender with normal pitched bowel sounds.  MUSCULOSKELETAL: There are no major deformities or cyanosis. NEUROLOGIC: No focal weakness or paresthesias are detected. SKIN: There are no ulcers or rashes noted. PSYCHIATRIC: The patient has a normal affect.  STUDIES:   I have reviewed the following: +-------+-----------+-----------+------------+------------+ ABI/TBIToday's ABIToday's TBIPrevious ABIPrevious TBI +-------+-----------+-----------+------------+------------+ Right  0.56       0.62       0.88        0.56         +-------+-----------+-----------+------------+------------+ Left   0.6        0.58       1.1         0.68          +-------+-----------+-----------+------------+------------+   ASSESSMENT and PLAN   Lower extremity atherosclerotic vascular disease: We discussed the treatment options including medical management versus intervention.  At this time his symptoms are so debilitating that I feel angiography is the next step.  I will plan on addressing his right leg first there for a left groin cannulation will be performed.  I will image both legs and intervene on the right if indicated.  We discussed the risk of bleeding and distal embolization.  All of his questions were answered.  He will be scheduled for angiography in the immediate future.  He will need to be off of his Eliquis prior to be procedure.   Leia Alf, MD, FACS [facility] [phone] Pager [phone]

## 2019-03-06 NOTE — Telephone Encounter (Signed)
Pt returning [place] phone call.

## 2019-03-06 NOTE — Progress Notes (Signed)
Vascular and Vein Specialist of [facility]  Patient name: [name] [name] MRN: [id number] DOB: 1950/10/07 Sex: male   REQUESTING PROVIDER:    [name]   REASON FOR CONSULT:    PAD  HISTORY OF PRESENT ILLNESS:   [name] [name] is a 68 y.o. male, who is referred today for evaluation of lower extremity vascular disease.  I had previously seen him in 2016 for similar problem however at that time his ABIs were higher and his symptoms were more consistent with nerve damage from his back issues.  Now, he has had a issues with progressive leg pain.  This begins with minimal activity and is somewhat alleviated with rest.  His right leg bothers him more than the left.  He does not have any open wounds or rest pain.  He quit smoking 3 years ago.  The patient has a history of coronary artery disease.  He underwent DES in 2010 in 2018.  He suffers from atrial fibrillation.  He is on Eliquis and has a loop recorder implanted.  He is statin and Repatha intolerant.  He is currently involved in a research study to address his hypercholesterolemia.  He is a diabetic.  PAST MEDICAL HISTORY    Past Medical History:  Diagnosis Date  . Anxiety   . Arthritis   . CAD (coronary artery disease)    a. 2010: DES to CTO of RCA. EF 55% b. 07/2016: cath showing total occlusion within previously placed RCA stent (collaterals present), severe stenosis along LCx and OM1 (treated with 2 overlapping DES). c. repeat cath in 01/2018 showing patent stents along LCx and OM with CTO of D2, CTO of distal LCx, and CTO of RCA with collaterals present overall unchanged since 2018 with medical management recom  . Cellulitis and abscess rt groin   . Depression   . Diabetes mellitus   . Disorders of iron metabolism   . GERD (gastroesophageal reflux disease)   . Hyperlipidemia   . Hypertension   . Low serum testosterone level   . Medically noncompliant   . Myocardial infarction [facility])       FAMILY HISTORY   Family History  Problem Relation Age of Onset  . Diabetes Father   . Valvular heart disease Father   . Arthritis Father   . Heart disease Father   . Stroke Father   . Alzheimer's disease Mother   . Hyperlipidemia Mother   . Hypertension Mother   . Arthritis Mother   . Lung cancer Mother   . Stroke Mother   . Arthritis/Rheumatoid Sister   . Diabetes Sister   . Hypertension Sister   . Hyperlipidemia Sister   . Depression Sister   . Dementia Maternal Aunt   . Dementia Maternal Uncle   . Heart disease Maternal Uncle   . Stomach cancer Paternal Uncle   . Colon cancer Neg Hx   . Liver disease Neg Hx     SOCIAL HISTORY:   Social History   Socioeconomic History  . Marital status: Married    Spouse name: Not on file  . Number of children: 2  . Years of education: 26  . Highest education level: 12th grade  Occupational History  . Occupation: retired  Scientific laboratory technician  . Financial resource strain: Not hard at all  . Food insecurity    Worry: Patient refused    Inability: Patient refused  . Transportation needs    Medical: Patient refused    Non-medical: Patient refused  Tobacco Use  . Smoking status: Former Smoker    Packs/day: 0.25    Years: 51.00    Pack years: 12.75    Types: Cigarettes    Quit date: 08/14/2016    Years since quitting: 2.5  . Smokeless tobacco: Never Used  . Tobacco comment: smokes  a pack a week  Substance and Sexual Activity  . Alcohol use: No    Alcohol/week: 0.0 standard drinks  . Drug use: No  . Sexual activity: Yes  Lifestyle  . Physical activity    Days per week: Patient refused    Minutes per session: Patient refused  . Stress: Not at all  Relationships  . Social Herbalist on phone: Patient refused    Gets together: Patient refused    Attends religious service: Patient refused    Active member of club or organization: Patient refused    Attends meetings of clubs or organizations: Patient refused     Relationship status: Patient refused  . Intimate partner violence    Fear of current or ex partner: Patient refused    Emotionally abused: Patient refused    Physically abused: Patient refused    Forced sexual activity: Patient refused  Other Topics Concern  . Not on file  Social History Narrative   Lives with his wife.  Retired.  Unable to afford medicines.      ALLERGIES:    Allergies  Allergen Reactions  . Shellfish Allergy Anaphylaxis and Other (See Comments)    Tongue swelling, hives   . Sulfa Antibiotics Anaphylaxis and Rash    Tongue swelling, hives  . Ace Inhibitors Other (See Comments) and Cough    CKD, renal failure   . Invokana [Canagliflozin] Other (See Comments)    Syncope / dehydration  . Metformin And Related Itching  . Pravastatin Sodium Other (See Comments)    myalgias  . Fenofibrate Other (See Comments)    Body aches  . Iodine Other (See Comments)    ????. PATIENT SAYS HE DID FINE WITH LAST CT WITH CONTRAST** patient had IV contrast on 06/21/2018 without pre meds without any issues. Patient had allergic reaction to Shellfish 30 years ago-not to IV contrast.   . Lisinopril Cough  . Livalo [Pitavastatin] Other (See Comments)    Myalgias   . Repatha [Evolocumab]     Myalgias, flu like sx  . Crestor [Rosuvastatin] Other (See Comments)    Myalgias   . Horse-Derived Products Rash  . Lexapro [Escitalopram Oxalate] Other (See Comments)    Buzzing in ears,headache, felt like a zombie  . Lipitor [Atorvastatin] Other (See Comments)    myalgias  . Tape Rash    CURRENT MEDICATIONS:    Current Outpatient Medications  Medication Sig Dispense Refill  . apixaban (ELIQUIS) 5 MG TABS tablet TAKE 1 TABLET BY MOUTH TWICE DAILY . 60 tablet 3  . aspirin EC 81 MG tablet Take 81 mg by mouth daily.    . DULoxetine (CYMBALTA) 30 MG capsule Take 1 capsule (30 mg total) by mouth daily. (Needs to be seen before next refill) 30 capsule 0  . fenofibrate 160 MG tablet TAKE 1  TABLET BY MOUTH ONCE DAILY FOR CHOLESTEROL AND  TRIGLYCERIDE 90 tablet 1  . furosemide (LASIX) 20 MG tablet Take 1 tablet by mouth twice daily (Patient taking differently: 20 mg every other day. ) 180 tablet 1  . gabapentin (NEURONTIN) 300 MG capsule Take 2 capsules (600 mg total) by mouth 3 (three) times  daily. (Needs to be seen before next refill) 180 capsule 0  . HUMALOG KWIKPEN 100 UNIT/ML KwikPen INJECT 5 TO 20 UNITS SUBCUTANEOUSLY THREE TIMES DAILY WITH MEALS (Need to be seen) 15 mL 0  . HYDROcodone-acetaminophen (NORCO) 10-325 MG tablet Take 1 tablet by mouth every 12 (twelve) hours as needed. 60 tablet 0  . Insulin Degludec (TRESIBA FLEXTOUCH) 200 UNIT/ML SOPN Inject 36 Units into the skin at bedtime. 12 pen 2  . insulin NPH-regular Human (HUMULIN 70/30) (70-30) 100 UNIT/ML injection Inject 90 Units into the skin 2 (two) times daily with a meal. (Patient taking differently: Inject 100 Units into the skin 2 (two) times daily with a meal. ) 60 mL 12  . isosorbide mononitrate (IMDUR) 30 MG 24 hr tablet Take 1 tablet by mouth once daily 90 tablet 1  . metoprolol succinate (TOPROL-XL) 50 MG 24 hr tablet Take 1 tablet (50 mg total) by mouth daily. 30 tablet 11  . nitroGLYCERIN (NITROSTAT) 0.4 MG SL tablet Place 1 tablet (0.4 mg total) under the tongue every 5 (five) minutes x 3 doses as needed for chest pain. 25 tablet 3  . OZEMPIC, 1 MG/DOSE, 2 MG/1.5ML SOPN INJECT 1MG  SUBQ ONCE A WEEK 12 mL 0  . pantoprazole (PROTONIX) 40 MG tablet Take 1 tablet by mouth once daily 90 tablet 1  . telmisartan (MICARDIS) 40 MG tablet TAKE 1 TABLET BY MOUTH ONCE DAILY. REPLACING LISINOPRIL 90 tablet 1   No current facility-administered medications for this visit.     REVIEW OF SYSTEMS:   [X]  denotes positive finding, [ ]  denotes negative finding Cardiac  Comments:  Chest pain or chest pressure:    Shortness of breath upon exertion: x   Short of breath when lying flat:    Irregular heart rhythm: x        Vascular    Pain in calf, thigh, or hip brought on by ambulation:    Pain in feet at night that wakes you up from your sleep:  x   Blood clot in your veins:    Leg swelling:  x       Pulmonary    Oxygen at home:    Productive cough:     Wheezing:         Neurologic    Sudden weakness in arms or legs:     Sudden numbness in arms or legs:     Sudden onset of difficulty speaking or slurred speech: x   Temporary loss of vision in one eye:     Problems with dizziness:  x       Gastrointestinal    Blood in stool:      Vomited blood:         Genitourinary    Burning when urinating:     Blood in urine:        Psychiatric    Major depression:         Hematologic    Bleeding problems:    Problems with blood clotting too easily:        Skin    Rashes or ulcers:        Constitutional    Fever or chills:     PHYSICAL EXAM:   Vitals:   03/06/19 1341  BP: (!) 157/91  Pulse: 85  Resp: 20  Temp: 97.8 F (36.6 C)  SpO2: 98%  Weight: 299 lb 11.2 oz (135.9 kg)  Height: 6\' 2"  (1.88 m)    GENERAL: The patient  is a well-nourished male, in no acute distress. The vital signs are documented above. CARDIAC: There is a regular rate and rhythm.  VASCULAR: Nonpalpable pedal pulses PULMONARY: Nonlabored respirations ABDOMEN: Soft and non-tender with normal pitched bowel sounds.  MUSCULOSKELETAL: There are no major deformities or cyanosis. NEUROLOGIC: No focal weakness or paresthesias are detected. SKIN: There are no ulcers or rashes noted. PSYCHIATRIC: The patient has a normal affect.  STUDIES:   I have reviewed the following: +-------+-----------+-----------+------------+------------+ ABI/TBIToday's ABIToday's TBIPrevious ABIPrevious TBI +-------+-----------+-----------+------------+------------+ Right  0.56       0.62       0.88        0.56         +-------+-----------+-----------+------------+------------+ Left   0.6        0.58       1.1         0.68          +-------+-----------+-----------+------------+------------+   ASSESSMENT and PLAN   Lower extremity atherosclerotic vascular disease: We discussed the treatment options including medical management versus intervention.  At this time his symptoms are so debilitating that I feel angiography is the next step.  I will plan on addressing his right leg first there for a left groin cannulation will be performed.  I will image both legs and intervene on the right if indicated.  We discussed the risk of bleeding and distal embolization.  All of his questions were answered.  He will be scheduled for angiography in the immediate future.  He will need to be off of his Eliquis prior to be procedure.   Leia Alf, MD, FACS [facility] [phone] Pager [phone]

## 2019-03-07 NOTE — Telephone Encounter (Signed)
Transmission received 03-06-2019

## 2019-03-08 ENCOUNTER — Telehealth: Payer: Self-pay

## 2019-03-08 MED ORDER — NITROGLYCERIN 0.4 MG SL SUBL: 0.4000 mg | SUBLINGUAL_TABLET | SUBLINGUAL | 3 refills | Status: DC | PRN

## 2019-03-08 NOTE — Telephone Encounter (Signed)
Prescription sent as requested.  Pt notified.

## 2019-03-08 NOTE — Telephone Encounter (Signed)
Pt wife is calling because the pt needs a refill on nitron glycerin tablets. I told them I will send Dr. Lovena Le nurse a note to give them a call back. The best number for the pt is [phone]. They said the nurse can leave a message if needed.

## 2019-03-09 ENCOUNTER — Ambulatory Visit: Payer: Medicare Other | Admitting: Neurology

## 2019-03-09 ENCOUNTER — Ambulatory Visit (INDEPENDENT_AMBULATORY_CARE_PROVIDER_SITE_OTHER): Payer: Medicare Other | Admitting: *Deleted

## 2019-03-09 DIAGNOSIS — I5030 Unspecified diastolic (congestive) heart failure: Secondary | ICD-10-CM | POA: Diagnosis not present

## 2019-03-09 DIAGNOSIS — I480 Paroxysmal atrial fibrillation: Secondary | ICD-10-CM

## 2019-03-09 LAB — CUP PACEART REMOTE DEVICE CHECK
Date Time Interrogation Session: 20201022141658
Implantable Pulse Generator Implant Date: 20180108

## 2019-03-14 NOTE — Progress Notes (Signed)
[facility]    Provider:  Dr Jaynee Eagles Referring Provider: [name], FNP Primary Care Physician:  [name], FNP  CC:  Headaches, untreated sleep apnea  Interval history 03/15/2019: He wakes up with headaches and goes to sleep with headaches. He has pain pills . It can be anywhere, on either side, both sides, pounding, it gets better during the day. He wakes up with headaches. No hx of migraines. No significant l ight sensitivity, no sound sensitivity, no nausea or vomiting. They can't travel, the virus has been difficult, he wanted to travel, he is stuck at home. The headache is throbbing. No tearing of the eyes or other autonomic symptoms. Can hurt right behind the eye. It is most days. It has gotten better lately but can be bad. Also the pollen was bad. He woke up one morning at 4am with a headache, his ear was covered with blood. Headaches associated with pressure in the sinuses and ear.   Reviewed CT Head: 02/20/2019: Stable left basal ganglia lacunar infarct. Mild atrophic changes are noted commenced with the patient's given age. No findings to suggest acute hemorrhage, acute infarction or space-occupying mass lesion are noted.  Vascular: No hyperdense vessel or unexpected calcification.  Skull: Normal. Negative for fracture or focal lesion.  Sinuses/Orbits: No acute finding.  Other: None.  IMPRESSION: Chronic changes as described. No acute intracranial abnormality is noted.   Interval history 03/14/2019: 68 y.o. male here as requested by [name], FNP for intractable headache. PMHx medical noncompliance, hypertension, hyperlipidemia, myocardial infarction, disorders of iron metabolism, diabetes, depression, cellulitis, arthritis, anxiety, coronary artery disease, occlusion and stenosis of vertebral artery, congestive heart failure, A. fib, periodontal disease, diabetic neuropathy. He is seeing my colleague Dr. Brett Fairy for sleep apnea.  However despite aoneas and snoring, he states he would never wear a cpap. He has been a many-decade long smoker.   Interval History 03/29/2018: DONTAVIS FEIOCK is a 68 y.o. male here as a referral from Dr. Lenna Gilford for atherosclerotic disease. PMHx HTN, diabetes, diabetic neuropathy, HLD, depression, obesity, afib. Not smoking. He is morbidly obese. He still snores. Not wearing cpap. He is having a sleep consultation Thursday. Discussed weight loss. Discussed trying to manage his diabetes closely, hga1c 8 (goal is <7),  Discussed weight and weight loss and he declines a referral to the [facility] [place]. Triglycerides too high to calculate LDL, he sees cardiology and is in a study for his cholesterol discussed ldl goal < 70, untreated sleep apnea. No syncopal episodes since stopping metoprolol. No new events.   reviewed images of CT 01/2018 and agree with the following:  IMPRESSION: 1. No acute intracranial process. 2. Old LEFT basal ganglia lacunar infarct, otherwise negative non-contrast CT HEAD for age.  Interval history 01/13/2017: Patient is here with his wife, he is lovely. He has good days and bad days, he has 1-2 cigarrettes every 2 weeks, he does not drink alcohol. He is on Eliquis and Plavix. Loop recorder identified afib. He has palpitations and yesterday he gets short of breath.His metoprolol was increased. He has shallow breathing and stops breathing at night.    HPI 05/24/2016:  BERN MEHALIC is a 68 y.o. male here as a referral from Dr. Lenna Gilford for atherosclerotic disease. PMHx HTN, diabetes, diabetic neuropathy, HLD, depression, obesity. Patient is here after admission to the [facility] in early January of this year for visual field changes, blurry vision, Sharp pain behind his right eye  and right-sided headache.He was admitted to telemetry for further evaluation. Carotid ultrasound did not show any significant ICA stenosis. MRI of the brain without contrast showed chronic  lacunar infarcts but nothing new. Imaging of the vessels showed atheromatous disease. EEG was normal. He did have a complete stroke workup and he is here for follow-up today. He was changed from aspirin to Plavix inpatient.  He stopped taking the Plavix. He has gone back to taking a baby aspirin. He is trying to manage his diabetes. He is on Trulicity now and working on Mudlogger with pcp. He is taking his glucose multiple times a day and adjusting insulin sliding scale. He tried a statin again and he can't tolerate it and stopped it. He is trying to eat better. He sees Administrator, arts.  He had some right sided sharp neck pain that radiated down the right arm. Since he left the hospital he can hear his heart beat in his neck radiating to the right ear where the sharp pain was (points to the carotid on the right)  Reviewed notes, labs and imaging from outside physicians, which showed:  HgbA1c 10 05/2016 Triglycerides 647 Cholesterol 239 Cannot calculat LDL  MRI brain:  IMPRESSION: 1. No acute intracranial abnormality. 2. Chronic left basal ganglia lacunar infarct.  MRA head: 1. No visible flow in the left V4 segment. See MRA neck that is reported separately. 2. High-grade atheromatous narrowings at the left ICA anterior genu, bilateral M2 branches, and bilateral P2 branches.  MRA neck : 1. Thready flow within the left vertebral artery which is new compared to 2015 imaging. Suspect a high-grade proximal left subclavian stenosis favoring steal phenomenon. Dissection is the main differential consideration. CTA is recommended to further evaluate. 2. Cervical carotid atherosclerosis with ~ 40% proximal left ICA Stenosis.  CT Angio of the neck: 1. Left V3 segment occlusion. Distal left V4 reconstitution by the basilar with patent left PICA. 2. Diffuse narrowing and luminal irregularity of the left vertebral artery. Correlate for neck pain as dissection or atherosclerosis  and underfilling could give this appearance. 3. Mild atherosclerotic narrowing of the left subclavian ostium. No subclavian flow limiting stenosis. 4. Cervical carotid atherosclerosis with 30 to 40 % ICA narrowing on the left.   Review of Systems: Patient complains of symptoms per HPI as well as the following symptoms: Fatigue, heat intolerance, joint pain, back pain, aching muscles, muscle cramps, walking difficulty, neck pain, memory loss, dizziness, headache, numbness, speech difficulty, weakness, agitation, depression. Pertinent negatives and positives per HPI. All others negative.   Social History   Socioeconomic History  . Marital status: Married    Spouse name: Not on file  . Number of children: 2  . Years of education: 83  . Highest education level: 12th grade  Occupational History  . Occupation: retired  Scientific laboratory technician  . Financial resource strain: Not hard at all  . Food insecurity    Worry: Patient refused    Inability: Patient refused  . Transportation needs    Medical: Patient refused    Non-medical: Patient refused  Tobacco Use  . Smoking status: Former Smoker    Packs/day: 0.25    Years: 51.00    Pack years: 12.75    Types: Cigarettes    Quit date: 08/14/2016    Years since quitting: 2.5  . Smokeless tobacco: Never Used  . Tobacco comment: smokes  a pack a week  Substance and Sexual Activity  . Alcohol use: No    Alcohol/week:  0.0 standard drinks  . Drug use: No  . Sexual activity: Yes  Lifestyle  . Physical activity    Days per week: Patient refused    Minutes per session: Patient refused  . Stress: Not at all  Relationships  . Social Herbalist on phone: Patient refused    Gets together: Patient refused    Attends religious service: Patient refused    Active member of club or organization: Patient refused    Attends meetings of clubs or organizations: Patient refused    Relationship status: Patient refused  . Intimate partner violence     Fear of current or ex partner: Patient refused    Emotionally abused: Patient refused    Physically abused: Patient refused    Forced sexual activity: Patient refused  Other Topics Concern  . Not on file  Social History Narrative   Lives with his wife.  Retired.  Unable to afford expensive medicines.      Family History  Problem Relation Age of Onset  . Diabetes Father   . Valvular heart disease Father   . Arthritis Father   . Heart disease Father   . Stroke Father   . Alzheimer's disease Mother   . Hyperlipidemia Mother   . Hypertension Mother   . Arthritis Mother   . Lung cancer Mother   . Stroke Mother   . Headache Mother   . Arthritis/Rheumatoid Sister   . Diabetes Sister   . Hypertension Sister   . Hyperlipidemia Sister   . Depression Sister   . Dementia Maternal Aunt   . Dementia Maternal Uncle   . Heart disease Maternal Uncle   . Stomach cancer Paternal Uncle   . Colon cancer Neg Hx   . Liver disease Neg Hx     Past Medical History:  Diagnosis Date  . Anxiety   . Arthritis   . CAD (coronary artery disease)    a. 2010: DES to CTO of RCA. EF 55% b. 07/2016: cath showing total occlusion within previously placed RCA stent (collaterals present), severe stenosis along LCx and OM1 (treated with 2 overlapping DES). c. repeat cath in 01/2018 showing patent stents along LCx and OM with CTO of D2, CTO of distal LCx, and CTO of RCA with collaterals present overall unchanged since 2018 with medical management recom  . Cellulitis and abscess rt groin   . Depression   . Diabetes mellitus   . Disorders of iron metabolism   . GERD (gastroesophageal reflux disease)   . Hyperlipidemia   . Hypertension   . Low serum testosterone level   . Medically noncompliant   . Myocardial infarction [facility])     Past Surgical History:  Procedure Laterality Date  . BACK SURGERY  2015   ACDF by Dr. [name]  . COLONOSCOPY N/A 10/01/2014   Dr. [name]: multiple tubular adenomas removed,  colonic diverticulosis, redundant colon. next tcs advised for 09/2017. PATIENT NEEDS PROPOFOL FOR FAILED CONSCIOUS SEDATION  . CORONARY STENT INTERVENTION N/A 07/30/2016   Procedure: Coronary Stent Intervention;  Surgeon: [name], MD;  Location: [facility];  Service: Cardiovascular;  Laterality: N/A;  . CORONARY STENT PLACEMENT  2000   By Dr. Olevia Perches  . EP IMPLANTABLE DEVICE N/A 05/25/2016   Procedure: Loop Recorder Insertion;  Surgeon: [name], MD;  Location: [place] [facility];  Service: Cardiovascular;  Laterality: N/A;  . ESOPHAGOGASTRODUODENOSCOPY     esophagus stretched remotely at [facility]  . ESOPHAGOGASTRODUODENOSCOPY N/A  10/01/2014   Dr. [name]: patchy mottling/erythema and minimal polypoid appearance of gastric mucosa. bx with mild inlammation but no H.pylori  . HERNIA REPAIR  [id number]   umbilical  . LEFT HEART CATH AND CORONARY ANGIOGRAPHY N/A 07/30/2016   Procedure: Left Heart Cath and Coronary Angiography;  Surgeon: [name], MD;  Location: [place] [facility];  Service: Cardiovascular;  Laterality: N/A;  . LEFT HEART CATH AND CORONARY ANGIOGRAPHY N/A 01/19/2018   Procedure: LEFT HEART CATH AND CORONARY ANGIOGRAPHY;  Surgeon: [name], MD;  Location: [facility];  Service: Cardiovascular;  Laterality: N/A;  . LESION REMOVAL     Lip and hand   . NECK SURGERY      Current Outpatient Medications  Medication Sig Dispense Refill  . apixaban (ELIQUIS) 5 MG TABS tablet TAKE 1 TABLET BY MOUTH TWICE DAILY . (Patient taking differently: Take 5 mg by mouth 2 (two) times daily. ) 60 tablet 3  . aspirin EC 81 MG tablet Take 81 mg by mouth daily.    . Chlorphen-Phenyleph-ASA (ALKA-SELTZER PLUS COLD PO) Take 2 tablets by mouth daily as needed (allergies).    . DULoxetine (CYMBALTA) 30 MG capsule Take 1 capsule (30 mg total) by mouth daily. (Needs to be seen before next refill) 30 capsule 0  . fenofibrate 160 MG tablet TAKE 1 TABLET BY MOUTH ONCE DAILY FOR  CHOLESTEROL AND  TRIGLYCERIDE (Patient taking differently: Take 160 mg by mouth daily. ) 90 tablet 1  . furosemide (LASIX) 20 MG tablet Take 1 tablet by mouth twice daily (Patient taking differently: Take 20 mg by mouth daily as needed for edema. ) 180 tablet 1  . gabapentin (NEURONTIN) 300 MG capsule Take 2 capsules (600 mg total) by mouth 3 (three) times daily. (Needs to be seen before next refill) 180 capsule 0  . HUMALOG KWIKPEN 100 UNIT/ML KwikPen INJECT 5 TO 20 UNITS SUBCUTANEOUSLY THREE TIMES DAILY WITH MEALS (Need to be seen) (Patient taking differently: Inject 5-20 Units into the skin 3 (three) times daily with meals. ) 15 mL 0  . HYDROcodone-acetaminophen (NORCO) 10-325 MG tablet Take 1 tablet by mouth every 12 (twelve) hours as needed. (Patient taking differently: Take 1 tablet by mouth every 12 (twelve) hours as needed for moderate pain. ) 60 tablet 0  . Insulin Degludec (TRESIBA FLEXTOUCH) 200 UNIT/ML SOPN Inject 36 Units into the skin at bedtime. 12 pen 2  . insulin NPH-regular Human (HUMULIN 70/30) (70-30) 100 UNIT/ML injection Inject 90 Units into the skin 2 (two) times daily with a meal. (Patient taking differently: Inject 100 Units into the skin 2 (two) times daily with a meal. ) 60 mL 12  . isosorbide mononitrate (IMDUR) 30 MG 24 hr tablet Take 1 tablet by mouth once daily (Patient taking differently: Take 30 mg by mouth daily. ) 90 tablet 1  . linaclotide (LINZESS) 72 MCG capsule Take 72 mcg by mouth daily as needed (constipation).    . Menthol, Topical Analgesic, (BLUE-EMU MAXIMUM STRENGTH EX) Apply 1 application topically daily as needed (back pain).    . metoprolol succinate (TOPROL-XL) 50 MG 24 hr tablet Take 1 tablet (50 mg total) by mouth daily. 30 tablet 11  . nitroGLYCERIN (NITROSTAT) 0.4 MG SL tablet Place 1 tablet (0.4 mg total) under the tongue every 5 (five) minutes x 3 doses as needed for chest pain. 25 tablet 3  . OZEMPIC, 1 MG/DOSE, 2 MG/1.5ML SOPN INJECT 1MG  SUBQ ONCE  A WEEK (Patient taking differently: Inject  1 mg into the skin every Tuesday. ) 12 mL 0  . pantoprazole (PROTONIX) 40 MG tablet Take 1 tablet by mouth once daily (Patient taking differently: Take 40 mg by mouth daily. ) 90 tablet 1  . telmisartan (MICARDIS) 40 MG tablet TAKE 1 TABLET BY MOUTH ONCE DAILY. REPLACING LISINOPRIL (Patient taking differently: Take 40 mg by mouth daily. ) 90 tablet 1  . triamcinolone (NASACORT ALLERGY 24HR) 55 MCG/ACT AERO nasal inhaler Place 1 spray into the nose daily as needed.     No current facility-administered medications for this visit.     Allergies as of 03/15/2019 - Review Complete 03/15/2019  Allergen Reaction Noted  . Shellfish allergy Anaphylaxis and Other (See Comments) 09/06/2014  . Sulfa antibiotics Anaphylaxis and Rash 09/06/2014  . Ace inhibitors Other (See Comments) and Cough 08/09/2013  . Invokana [canagliflozin] Other (See Comments) 09/04/2013  . Metformin and related Itching 07/12/2013  . Pravastatin sodium Other (See Comments) 11/14/2014  . Fenofibrate Other (See Comments) 11/14/2015  . Iodine Other (See Comments) 09/07/2014  . Lisinopril Cough 10/04/2014  . Livalo [pitavastatin] Other (See Comments) 06/25/2016  . Repatha [evolocumab]  01/04/2017  . Crestor [rosuvastatin] Other (See Comments) 06/05/2013  . Horse-derived products Rash 07/25/2008  . Lexapro [escitalopram oxalate] Other (See Comments) 01/13/2016  . Lipitor [atorvastatin] Other (See Comments) 06/05/2013  . Tape Rash 07/25/2008    Vitals: BP (!) 185/83 (BP Location: Right Arm, Patient Position: Sitting) Comment: pt states he hasn't taken his medication yet today  Pulse 77   Temp (!) 97.3 F (36.3 C) Comment: wife 97.7; both taken at front door  Ht 6\' 2"  (1.88 m)   Wt (!) 302 lb (137 kg)   BMI 38.77 kg/m  Last Weight:  Wt Readings from Last 1 Encounters:  03/15/19 (!) 302 lb (137 kg)   Last Height:   Ht Readings from Last 1 Encounters:  03/15/19 6\' 2"  (1.88 m)     Physical exam: Exam: Gen: NAD, conversant, well nourised, morbidly obese, well groomed                     Eyes: Conjunctivae clear without exudates or hemorrhage  Neuro: Detailed Neurologic Exam  Speech:    Speech is normal; fluent and spontaneous with normal comprehension.  Cognition:    The patient is oriented to person, place, and time;  Cranial Nerves:    The pupils are equal, round, and reactive to light.Visual fields are full to finger confrontation. Extraocular movements are intact. Trigeminal sensation is intact and the muscles of mastication are normal. The face is symmetric. The palate elevates in the midline. Hearing intact. Voice is normal. Shoulder shrug is normal. The tongue has normal motion without fasciculations.   Gait:    Wide based due to extremely large body habitus  Motor Observation:    No asymmetry, no atrophy, and no involuntary movements noted. Tone:    Normal muscle tone.    Posture:    Posture is normal. normal erect    Strength:    Strength is V/V in the upper and lower limbs.      Sensation: decreased distally in a glove/stocking distribution.     Reflex Exam:  DTR's:    Absent AJs. Deep tendon reflexes in the upper and lower extremities are symmetrical bilaterally.     Assessment/Plan: Absolutely delightful gentleman with his lovely wife here again.  Mr.Evie A Groganis a 68y.o.malewith history of hypertension, uncontrolled  hyperlipidemia, diabetic neuropathy, ongoing occasional  tobacco use, remote strokes, uncontrolled diabetes mellitus, afib on Eliquis, coronary artery disease, history of medical noncompliance,morbid obesity and untreated sleep apneamellituspresenting with morning headaches in the setting of untreated sleep apnea. Workup shows Cerebrovascular atherosclerotic disease. No more syncopal events since stopping metoprolol.   - He has untreated sleep apnea and declines being treated, this is probably untreated sleep apnea. We  spent an extended amount of time discussing, encouraging him to let me at least put an 02 monitor on him overnight. I may not be able to help him. He has said in the past he will accept the consequences of his untreated sleep apnea and morbid obesity and lifestyle and I told him these may be some of the consequences but not all.  - Will put an O2 monitor on him overnight, this is not comprehensive but the only thing he will let me do. - Discussed weight loss. Declines referral to [facility] again today  - Discussed trying to manage his diabetes closely, hga1c  goal is <7),   - Discussed weight and weight loss and he declines a referral to the [facility] [place].  -  cholesterol discussed ldl goal < 70,  - Cervical carotid atherosclerosis with 30 to 40 % ICA narrowing on the left in 2018. Recommend follow up, they decline and so I recommend discussing with cardiology he has follow up scheduled.He sees Dr. Lovena Le in Cardiology.   OSA:  OSA and hypoventilation syndrome: He has shallow breathing and stops breathing at night. He has afib and palpitations. He is morbidly obese. He still snores.  Fatigued, memory loss, daytime somnolence, He nods off during the day. Gets very tired. Again he declines any kind of testing. Morning headaches  Afib: continue eliquis and telemisartan  Stroke an dvascular risk factors: I had a long d/w patient about his stroke risk, risk for recurrent stroke/TIAs, personally independently reviewed imaging studies and stroke evaluation results and answered questions.Continue Plavix and Eliquis for secondary stroke prevention and maintain strict control of hypertension with blood pressure goal below 130/90, diabetes with hemoglobin A1c goal below 6.5% and lipids with LDL cholesterol goal below 70 mg/dL.Patient has h/o statin intolerance hence recommend new PCSK9 inhibitor like Praluent. Recommend he see his cardiologist MD to get  prescription. I also advised the patient to eat a healthy diet with plenty of whole grains, cereals, fruits and vegetables, exercise regularly and maintain ideal body weight .Followup in the future with me in 6 months or call earlier if necessary. Recommend aggressive management of his uncontrolled vascular risk factors (cholesterol, diabetes, HTN).  With diffuse cerebrovascular disease would avoid hypotension. Discussed smoking cessation and obesity. Highly encouraged him to manage his vascular risk factors which are extensive.  Sarina Ill, MD  [facility] [address] [place], [place] [address]  Phone [phone] Fax [phone]  A total of 50 minutes was spent face-to-face with this patient. Over half this time was spent on counseling patient on the  1. Morning headache   2. Memory loss    diagnosis and different diagnostic and therapeutic options available.

## 2019-03-15 ENCOUNTER — Telehealth: Payer: Self-pay | Admitting: Neurology

## 2019-03-15 ENCOUNTER — Encounter: Payer: Self-pay | Admitting: Neurology

## 2019-03-15 ENCOUNTER — Ambulatory Visit: Payer: Medicare Other | Admitting: Neurology

## 2019-03-15 ENCOUNTER — Other Ambulatory Visit: Payer: Self-pay | Admitting: Neurology

## 2019-03-15 ENCOUNTER — Other Ambulatory Visit: Payer: Self-pay

## 2019-03-15 VITALS — BP 185/83 | HR 77 | Temp 97.3°F | Ht 74.0 in | Wt 302.0 lb

## 2019-03-15 DIAGNOSIS — R413 Other amnesia: Secondary | ICD-10-CM

## 2019-03-15 DIAGNOSIS — I6381 Other cerebral infarction due to occlusion or stenosis of small artery: Secondary | ICD-10-CM

## 2019-03-15 DIAGNOSIS — R519 Headache, unspecified: Secondary | ICD-10-CM

## 2019-03-15 DIAGNOSIS — I480 Paroxysmal atrial fibrillation: Secondary | ICD-10-CM

## 2019-03-15 DIAGNOSIS — R0681 Apnea, not elsewhere classified: Secondary | ICD-10-CM

## 2019-03-15 NOTE — Telephone Encounter (Signed)
Order placed for an overnight oximetry for the patient. Order sent to [facility] for them to set the patient up. I asked that results be sent to Dr Jaynee Eagles and myself.

## 2019-03-15 NOTE — Patient Instructions (Signed)
Obesity Hypoventilation Syndrome  Obesity hypoventilation syndrome (OHS) means that you are not breathing well enough to get air in and out of your lungs efficiently (ventilation). This causes a low oxygen level and a high carbon dioxide level in your blood (hypoventilation). Having too much total body fat (obesity) is a significant risk factor for developing OHS. OHS makes it harder for your heart to pump oxygen-rich blood to your body. It can cause sleep disturbances and make you feel sleepy during the day. Over time, OHS can increase your risk for:  Heart disease.  High blood pressure (hypertension).  Reduced ability to absorb sugar from the bloodstream (insulin resistance).  Heart failure. Over time, OHS weakens your heart and can lead to heart failure. What are the causes? The exact cause of OHS is not known. Possible causes include:  Pressure on the lungs from excess body weight.  Obesity-related changes in how much air the lungs can hold (lung capacity) and how much they can expand (lung compliance).  Failure of the brain to regulate oxygen and carbon dioxide levels properly.  Chemicals (hormones) produced by excess fat cells interfering with breathing regulation.  A breathing condition in which breathing pauses or becomes shallow during sleep (sleep apnea). This condition can eventually cause the body to ventilate poorly and to hold onto carbon dioxide during the day. What increases the risk? You may have a greater risk for OHS if you:  Have a BMI of 30 or higher. BMI is an estimate of body fat that is calculated from height and weight. For adults, a BMI of 30 or higher is considered obese.  Are 40?68 years old.  Carry most of your excess weight around your waist.  Experience moderate symptoms of sleep apnea. What are the signs or symptoms? The most common symptoms of OHS are:  Daytime sleepiness.  Lack of energy.  Shortness of breath.  Morning headaches.  Sleep  apnea.  Trouble concentrating.  Irritability, mood swings, or depression.  Swollen veins in the neck.  Swelling of the legs. How is this diagnosed? Your health care provider may suspect OHS if you are obese and have poor breathing during the day and at night. Your health care provider will also do a physical exam. You may have tests to:  Measure your BMI.  Measure your blood oxygen level with a sensor placed on your finger (pulse oximetry).  Measure blood oxygen and carbon dioxide in a blood sample.  Measure the amount of red blood cells in a blood sample. OHS causes the number of red blood cells you have to increase (polycythemia).  Check your breathing ability (pulmonary function testing).  Check your breathing ability, breathing patterns, and oxygen level while you sleep (sleep study). You may also have a chest X-Jonuel to rule out other breathing problems. You may have an electrocardiogram (ECG) and or echocardiogram to check for signs of heart failure. How is this treated? Weight loss is the most important part of treatment for OHS, and it may be the only treatment that you need. Other treatments may include:  Using a device to open your airway while you sleep, such as a continuous positive airway pressure (CPAP) machine that delivers oxygen to your airway through a mask.  Surgery (gastric bypass surgery) to lower your BMI. This may be needed if: ? You are very obese. ? Other treatments have not worked for you. ? Your OHS is very severe and is causing organ damage, such as heart failure. Follow these   instructions at home:  Medicines  Take over-the-counter and prescription medicines only as told by your health care provider.  Ask your health care provider what medicines are safe for you. You may be told to avoid medicines that can impair breathing and make OHS worse, such as sedatives and narcotics. Sleeping habits  If you are prescribed a CPAP machine, make sure you  understand and use the machine as directed.  Try to get 8 hours of sleep every night.  Go to bed at the same time every night, and get up at the same time every day. General instructions  Work with your health care provider to make a diet and exercise plan that helps you reach and maintain a healthy weight.  Eat a healthy diet.  Avoid smoking.  Exercise regularly as told by your health care provider.  During the evening, do not drink caffeine and do not eat heavy meals.  Keep all follow-up visits as told by your health care provider. This is important. Contact a health care provider if:  You experience new or worsening shortness of breath.  You have chest pain.  You have an irregular heartbeat (palpitations).  You have dizziness.  You faint.  You develop a cough.  You have a fever.  You have chest pain when you breathe (pleurisy). This information is not intended to replace advice given to you by your health care provider. Make sure you discuss any questions you have with your health care provider. Document Released: 10/14/2015 Document Revised: 08/26/2018 Document Reviewed: 10/14/2015 Elsevier Patient Education  2020 [place].   Sleep Apnea Sleep apnea is a condition in which breathing pauses or becomes shallow during sleep. Episodes of sleep apnea usually last 10 seconds or longer, and they may occur as many as 20 times an hour. Sleep apnea disrupts your sleep and keeps your body from getting the rest that it needs. This condition can increase your risk of certain health problems, including:  Heart attack.  Stroke.  Obesity.  Diabetes.  Heart failure.  Irregular heartbeat. What are the causes? There are three kinds of sleep apnea:  Obstructive sleep apnea. This kind is caused by a blocked or collapsed airway.  Central sleep apnea. This kind happens when the part of the brain that controls breathing does not send the correct signals to the muscles that  control breathing.  Mixed sleep apnea. This is a combination of obstructive and central sleep apnea. The most common cause of this condition is a collapsed or blocked airway. An airway can collapse or become blocked if:  Your throat muscles are abnormally relaxed.  Your tongue and tonsils are larger than normal.  You are overweight.  Your airway is smaller than normal. What increases the risk? You are more likely to develop this condition if you:  Are overweight.  Smoke.  Have a smaller than normal airway.  Are elderly.  Are male.  Drink alcohol.  Take sedatives or tranquilizers.  Have a family history of sleep apnea. What are the signs or symptoms? Symptoms of this condition include:  Trouble staying asleep.  Daytime sleepiness and tiredness.  Irritability.  Loud snoring.  Morning headaches.  Trouble concentrating.  Forgetfulness.  Decreased interest in sex.  Unexplained sleepiness.  Mood swings.  Personality changes.  Feelings of depression.  Waking up often during the night to urinate.  Dry mouth.  Sore throat. How is this diagnosed? This condition may be diagnosed with:  A medical history.  A physical exam.  A series of tests that are done while you are sleeping (sleep study). These tests are usually done in a sleep lab, but they may also be done at home. How is this treated? Treatment for this condition aims to restore normal breathing and to ease symptoms during sleep. It may involve managing health issues that can affect breathing, such as high blood pressure or obesity. Treatment may include:  Sleeping on your side.  Using a decongestant if you have nasal congestion.  Avoiding the use of depressants, including alcohol, sedatives, and narcotics.  Losing weight if you are overweight.  Making changes to your diet.  Quitting smoking.  Using a device to open your airway while you sleep, such as: ? An oral appliance. This is a  custom-made mouthpiece that shifts your lower jaw forward. ? A continuous positive airway pressure (CPAP) device. This device blows air through a mask when you breathe out (exhale). ? A nasal expiratory positive airway pressure (EPAP) device. This device has valves that you put into each nostril. ? A bi-level positive airway pressure (BPAP) device. This device blows air through a mask when you breathe in (inhale) and breathe out (exhale).  Having surgery if other treatments do not work. During surgery, excess tissue is removed to create a wider airway. It is important to get treatment for sleep apnea. Without treatment, this condition can lead to:  High blood pressure.  Coronary artery disease.  In men, an inability to achieve or maintain an erection (impotence).  Reduced thinking abilities. Follow these instructions at home: Lifestyle  Make any lifestyle changes that your health care provider recommends.  Eat a healthy, well-balanced diet.  Take steps to lose weight if you are overweight.  Avoid using depressants, including alcohol, sedatives, and narcotics.  Do not use any products that contain nicotine or tobacco, such as cigarettes, e-cigarettes, and chewing tobacco. If you need help quitting, ask your health care provider. General instructions  Take over-the-counter and prescription medicines only as told by your health care provider.  If you were given a device to open your airway while you sleep, use it only as told by your health care provider.  If you are having surgery, make sure to tell your health care provider you have sleep apnea. You may need to bring your device with you.  Keep all follow-up visits as told by your health care provider. This is important. Contact a health care provider if:  The device that you received to open your airway during sleep is uncomfortable or does not seem to be working.  Your symptoms do not improve.  Your symptoms get worse. Get  help right away if:  You develop: ? Chest pain. ? Shortness of breath. ? Discomfort in your back, arms, or stomach.  You have: ? Trouble speaking. ? Weakness on one side of your body. ? Drooping in your face. These symptoms may represent a serious problem that is an emergency. Do not wait to see if the symptoms will go away. Get medical help right away. Call your local emergency services (911 in the [place] Do not drive yourself to the hospital. Summary  Sleep apnea is a condition in which breathing pauses or becomes shallow during sleep.  The most common cause is a collapsed or blocked airway.  The goal of treatment is to restore normal breathing and to ease symptoms during sleep. This information is not intended to replace advice given to you by your health care provider. Make sure you  discuss any questions you have with your health care provider. Document Released: 04/24/2002 Document Revised: 02/18/2018 Document Reviewed: 12/28/2017 Elsevier Patient Education  2020 [employer].

## 2019-03-15 NOTE — Telephone Encounter (Signed)
Myriam Jacobson, this is a patient of Dr. Keturah Barre. He refuses sleep study but he has agreed to let me monitor his O2 overnight. Would you mind placing the order?

## 2019-03-17 ENCOUNTER — Other Ambulatory Visit: Payer: Self-pay

## 2019-03-17 ENCOUNTER — Other Ambulatory Visit (HOSPITAL_COMMUNITY)
Admission: RE | Admit: 2019-03-17 | Discharge: 2019-03-17 | Disposition: A | Discharge status: Home / Self Care | Payer: Medicare Other | Source: Ambulatory Visit | Attending: Surgery | Admitting: Surgery

## 2019-03-17 DIAGNOSIS — Z01812 Encounter for preprocedural laboratory examination: Secondary | ICD-10-CM | POA: Diagnosis not present

## 2019-03-17 DIAGNOSIS — Z20828 Contact with and (suspected) exposure to other viral communicable diseases: Secondary | ICD-10-CM | POA: Insufficient documentation

## 2019-03-17 LAB — SARS CORONAVIRUS 2 (TAT 6-24 HRS): SARS Coronavirus 2: NEGATIVE

## 2019-03-21 ENCOUNTER — Ambulatory Visit (HOSPITAL_COMMUNITY)
Admission: RE | Admit: 2019-03-21 | Discharge: 2019-03-21 | Disposition: A | Discharge status: Home / Self Care | Payer: Medicare Other | Source: Ambulatory Visit | Attending: Surgery | Admitting: Surgery

## 2019-03-21 ENCOUNTER — Encounter (HOSPITAL_COMMUNITY): Payer: Self-pay | Admitting: Surgery

## 2019-03-21 ENCOUNTER — Encounter (HOSPITAL_COMMUNITY)
Admission: RE | Disposition: A | Discharge status: Home / Self Care | Payer: Self-pay | Source: Ambulatory Visit | Attending: Surgery

## 2019-03-21 ENCOUNTER — Other Ambulatory Visit: Payer: Self-pay

## 2019-03-21 DIAGNOSIS — Z882 Allergy status to sulfonamides status: Secondary | ICD-10-CM | POA: Diagnosis not present

## 2019-03-21 DIAGNOSIS — I70213 Atherosclerosis of native arteries of extremities with intermittent claudication, bilateral legs: Secondary | ICD-10-CM | POA: Diagnosis not present

## 2019-03-21 DIAGNOSIS — Z888 Allergy status to other drugs, medicaments and biological substances status: Secondary | ICD-10-CM | POA: Insufficient documentation

## 2019-03-21 DIAGNOSIS — Z7982 Long term (current) use of aspirin: Secondary | ICD-10-CM | POA: Diagnosis not present

## 2019-03-21 DIAGNOSIS — I2510 Atherosclerotic heart disease of native coronary artery without angina pectoris: Secondary | ICD-10-CM | POA: Insufficient documentation

## 2019-03-21 DIAGNOSIS — I252 Old myocardial infarction: Secondary | ICD-10-CM | POA: Diagnosis not present

## 2019-03-21 DIAGNOSIS — Z7901 Long term (current) use of anticoagulants: Secondary | ICD-10-CM | POA: Diagnosis not present

## 2019-03-21 DIAGNOSIS — Z8249 Family history of ischemic heart disease and other diseases of the circulatory system: Secondary | ICD-10-CM | POA: Insufficient documentation

## 2019-03-21 DIAGNOSIS — M1990 Unspecified osteoarthritis, unspecified site: Secondary | ICD-10-CM | POA: Diagnosis not present

## 2019-03-21 DIAGNOSIS — E785 Hyperlipidemia, unspecified: Secondary | ICD-10-CM | POA: Insufficient documentation

## 2019-03-21 DIAGNOSIS — Z87891 Personal history of nicotine dependence: Secondary | ICD-10-CM | POA: Diagnosis not present

## 2019-03-21 DIAGNOSIS — Z79899 Other long term (current) drug therapy: Secondary | ICD-10-CM | POA: Insufficient documentation

## 2019-03-21 DIAGNOSIS — Z833 Family history of diabetes mellitus: Secondary | ICD-10-CM | POA: Insufficient documentation

## 2019-03-21 DIAGNOSIS — E119 Type 2 diabetes mellitus without complications: Secondary | ICD-10-CM | POA: Diagnosis not present

## 2019-03-21 DIAGNOSIS — I4891 Unspecified atrial fibrillation: Secondary | ICD-10-CM | POA: Insufficient documentation

## 2019-03-21 DIAGNOSIS — K219 Gastro-esophageal reflux disease without esophagitis: Secondary | ICD-10-CM | POA: Diagnosis not present

## 2019-03-21 DIAGNOSIS — I10 Essential (primary) hypertension: Secondary | ICD-10-CM | POA: Insufficient documentation

## 2019-03-21 DIAGNOSIS — Z794 Long term (current) use of insulin: Secondary | ICD-10-CM | POA: Insufficient documentation

## 2019-03-21 HISTORY — PX: ABDOMINAL AORTOGRAM W/LOWER EXTREMITY: CATH118223

## 2019-03-21 HISTORY — PX: PERIPHERAL VASCULAR INTERVENTION: CATH118257

## 2019-03-21 LAB — GLUCOSE, CAPILLARY
Glucose-Capillary: 219 mg/dL — ABNORMAL HIGH (ref 70–99)
Glucose-Capillary: 225 mg/dL — ABNORMAL HIGH (ref 70–99)

## 2019-03-21 LAB — POCT I-STAT, CHEM 8
BUN: 22 mg/dL (ref 8–23)
Calcium, Ion: 1.26 mmol/L (ref 1.15–1.40)
Chloride: 95 mmol/L — ABNORMAL LOW (ref 98–111)
Creatinine, Ser: 1.2 mg/dL (ref 0.61–1.24)
Glucose, Bld: 240 mg/dL — ABNORMAL HIGH (ref 70–99)
HCT: 46 % (ref 39.0–52.0)
Hemoglobin: 15.6 g/dL (ref 13.0–17.0)
Potassium: 4.4 mmol/L (ref 3.5–5.1)
Sodium: 133 mmol/L — ABNORMAL LOW (ref 135–145)
TCO2: 29 mmol/L (ref 22–32)

## 2019-03-21 LAB — POCT ACTIVATED CLOTTING TIME: Activated Clotting Time: 268 seconds

## 2019-03-21 SURGERY — ABDOMINAL AORTOGRAM W/LOWER EXTREMITY
Anesthesia: LOCAL | Laterality: Right

## 2019-03-21 MED ORDER — FAMOTIDINE IN NACL 20-0.9 MG/50ML-% IV SOLN
20.0000 mg | INTRAVENOUS | Status: AC
  Administered 2019-03-21: 20 mg via INTRAVENOUS
  Filled 2019-03-21: qty 50

## 2019-03-21 MED ORDER — MIDAZOLAM HCL 2 MG/2ML IJ SOLN
INTRAMUSCULAR | Status: DC | PRN
  Administered 2019-03-21: 1 mg via INTRAVENOUS
  Administered 2019-03-21: 2 mg via INTRAVENOUS

## 2019-03-21 MED ORDER — LIDOCAINE HCL (PF) 1 % IJ SOLN
INTRAMUSCULAR | Status: DC | PRN
  Administered 2019-03-21: 18 mL via INTRADERMAL

## 2019-03-21 MED ORDER — HYDRALAZINE HCL 20 MG/ML IJ SOLN: 5.0000 mg | INTRAMUSCULAR | Status: DC | PRN

## 2019-03-21 MED ORDER — METHYLPREDNISOLONE SODIUM SUCC 125 MG IJ SOLR
125.0000 mg | INTRAMUSCULAR | Status: AC
  Administered 2019-03-21: 125 mg via INTRAVENOUS
  Filled 2019-03-21: qty 2

## 2019-03-21 MED ORDER — DIPHENHYDRAMINE HCL 50 MG/ML IJ SOLN
25.0000 mg | INTRAMUSCULAR | Status: AC
  Administered 2019-03-21: 25 mg via INTRAVENOUS
  Filled 2019-03-21: qty 1

## 2019-03-21 MED ORDER — HEPARIN SODIUM (PORCINE) 1000 UNIT/ML IJ SOLN
INTRAMUSCULAR | Status: DC | PRN
  Administered 2019-03-21: 10000 [IU] via INTRAVENOUS

## 2019-03-21 MED ORDER — SODIUM CHLORIDE 0.9% FLUSH: 3.0000 mL | Freq: Two times a day (BID) | INTRAVENOUS | Status: DC

## 2019-03-21 MED ORDER — IODIXANOL 320 MG/ML IV SOLN
INTRAVENOUS | Status: DC | PRN
  Administered 2019-03-21: 145 mL via INTRA_ARTERIAL

## 2019-03-21 MED ORDER — FENTANYL CITRATE (PF) 100 MCG/2ML IJ SOLN
INTRAMUSCULAR | Status: AC
  Filled 2019-03-21: qty 2

## 2019-03-21 MED ORDER — SODIUM CHLORIDE 0.9 % WEIGHT BASED INFUSION: 1.0000 mL/kg/h | INTRAVENOUS | Status: DC

## 2019-03-21 MED ORDER — ACETAMINOPHEN 325 MG PO TABS: 650.0000 mg | ORAL_TABLET | ORAL | Status: DC | PRN

## 2019-03-21 MED ORDER — LIDOCAINE HCL (PF) 1 % IJ SOLN
INTRAMUSCULAR | Status: AC
  Filled 2019-03-21: qty 30

## 2019-03-21 MED ORDER — HEPARIN (PORCINE) IN NACL 1000-0.9 UT/500ML-% IV SOLN
INTRAVENOUS | Status: AC
  Filled 2019-03-21: qty 1000

## 2019-03-21 MED ORDER — MIDAZOLAM HCL 2 MG/2ML IJ SOLN
INTRAMUSCULAR | Status: AC
  Filled 2019-03-21: qty 2

## 2019-03-21 MED ORDER — SODIUM CHLORIDE 0.9 % IV SOLN
INTRAVENOUS | Status: DC
  Administered 2019-03-21: 08:00:00 via INTRAVENOUS

## 2019-03-21 MED ORDER — OXYCODONE HCL 5 MG PO TABS: 5.0000 mg | ORAL_TABLET | ORAL | Status: DC | PRN

## 2019-03-21 MED ORDER — SODIUM CHLORIDE 0.9% FLUSH: 3.0000 mL | INTRAVENOUS | Status: DC | PRN

## 2019-03-21 MED ORDER — CLOPIDOGREL BISULFATE 75 MG PO TABS: 75.0000 mg | ORAL_TABLET | Freq: Every day | ORAL | Status: DC

## 2019-03-21 MED ORDER — ASPIRIN EC 81 MG PO TBEC: 81.0000 mg | DELAYED_RELEASE_TABLET | Freq: Every day | ORAL | Status: DC

## 2019-03-21 MED ORDER — FENTANYL CITRATE (PF) 100 MCG/2ML IJ SOLN
INTRAMUSCULAR | Status: DC | PRN
  Administered 2019-03-21: 50 ug via INTRAVENOUS
  Administered 2019-03-21: 25 ug via INTRAVENOUS

## 2019-03-21 MED ORDER — ONDANSETRON HCL 4 MG/2ML IJ SOLN: 4.0000 mg | Freq: Four times a day (QID) | INTRAMUSCULAR | Status: DC | PRN

## 2019-03-21 MED ORDER — MORPHINE SULFATE (PF) 2 MG/ML IV SOLN: 2.0000 mg | INTRAVENOUS | Status: DC | PRN

## 2019-03-21 MED ORDER — LABETALOL HCL 5 MG/ML IV SOLN: 10.0000 mg | INTRAVENOUS | Status: DC | PRN

## 2019-03-21 MED ORDER — HEPARIN (PORCINE) IN NACL 1000-0.9 UT/500ML-% IV SOLN
INTRAVENOUS | Status: DC | PRN
  Administered 2019-03-21 (×2): 500 mL

## 2019-03-21 MED ORDER — HEPARIN SODIUM (PORCINE) 1000 UNIT/ML IJ SOLN
INTRAMUSCULAR | Status: AC
  Filled 2019-03-21: qty 1

## 2019-03-21 MED ORDER — CLOPIDOGREL BISULFATE 75 MG PO TABS
75.0000 mg | ORAL_TABLET | Freq: Every day | ORAL | Status: DC
  Administered 2019-03-21: 75 mg via ORAL
  Filled 2019-03-21: qty 1

## 2019-03-21 MED ORDER — CLOPIDOGREL BISULFATE 75 MG PO TABS: 75.0000 mg | ORAL_TABLET | Freq: Every day | ORAL | 11 refills | Status: DC

## 2019-03-21 MED ORDER — SODIUM CHLORIDE 0.9 % IV SOLN: 250.0000 mL | INTRAVENOUS | Status: DC | PRN

## 2019-03-21 SURGICAL SUPPLY — 25 items
BAG SNAP BAND KOVER 36X36 (MISCELLANEOUS) ×1 IMPLANT
BALLN MUSTANG 6X100X135 (BALLOONS) ×3
BALLOON MUSTANG 6X100X135 (BALLOONS) IMPLANT
CATH OMNI FLUSH 5F 65CM (CATHETERS) ×1 IMPLANT
CATH QUICKCROSS .035X135CM (MICROCATHETER) ×1 IMPLANT
CLOSURE MYNX CONTROL 6F/7F (Vascular Products) ×1 IMPLANT
COVER DOME SNAP 22 D (MISCELLANEOUS) ×1 IMPLANT
DEVICE TORQUE H2O (MISCELLANEOUS) ×1 IMPLANT
GUIDEWIRE ANGLED .035X150CM (WIRE) ×1 IMPLANT
KIT ENCORE 26 ADVANTAGE (KITS) ×1 IMPLANT
KIT MICROPUNCTURE NIT STIFF (SHEATH) ×1 IMPLANT
KIT PV (KITS) ×3 IMPLANT
SHEATH PINNACLE 5F 10CM (SHEATH) ×1 IMPLANT
SHEATH PINNACLE 6F 10CM (SHEATH) ×1 IMPLANT
SHEATH PINNACLE ST 6F 45CM (SHEATH) ×1 IMPLANT
SHEATH PROBE COVER 6X72 (BAG) ×1 IMPLANT
SHIELD RADPAD SCOOP 12X17 (MISCELLANEOUS) ×1 IMPLANT
STENT ELUVIA 7X120X130 (Permanent Stent) ×1 IMPLANT
SYR MEDRAD MARK V 150ML (SYRINGE) ×1 IMPLANT
TAPE VIPERTRACK RADIOPAQ (MISCELLANEOUS) IMPLANT
TAPE VIPERTRACK RADIOPAQUE (MISCELLANEOUS) ×3
TRANSDUCER W/STOPCOCK (MISCELLANEOUS) ×3 IMPLANT
TRAY PV CATH (CUSTOM PROCEDURE TRAY) ×3 IMPLANT
WIRE BENTSON .035X145CM (WIRE) ×1 IMPLANT
WIRE HI TORQ VERSACORE 300 (WIRE) ×1 IMPLANT

## 2019-03-21 NOTE — Op Note (Signed)
    Patient name: [name] [name] MRN: [id number] DOB: 02-02-1951 Sex: male  03/21/2019 Pre-operative Diagnosis: Bilateral claudication Post-operative diagnosis:  Same Surgeon:  Annamarie Major Procedure Performed:  1.  Ultrasound-guided access, left femoral artery  2.  Abdominal aortogram  3.  Bilateral lower extremity runoff  4.  Stent, right superficial femoral artery  5.  Mynx closure device to the left groin  6.  Conscious sedation, 55 minutes   Indications: Patient has severe bilateral claudication right greater than left.  He is here today for evaluation and possible intervention  Procedure:  The patient was identified in the holding area and taken to room 8.  The patient was then placed supine on the table and prepped and draped in the usual sterile fashion.  A time out was called.  Conscious sedation was administered with the use of IV fentanyl and Versed under continuous physician and nurse monitoring.  Heart rate, blood pressure, and oxygen saturation were continuously monitored.  Total sedation time was 55 minutes.  Ultrasound was used to evaluate the left common femoral artery.  It was patent .  A digital ultrasound image was acquired.  A micropuncture needle was used to access the left common femoral artery under ultrasound guidance.  An 018 wire was advanced without resistance and a micropuncture sheath was placed.  The 018 wire was removed and a benson wire was placed.  The micropuncture sheath was exchanged for a 5 french sheath.  An omniflush catheter was advanced over the wire to the level of L-1.  An abdominal angiogram was obtained.  Next, bilateral runoff was performed  Findings:   Aortogram: No significant renal artery stenosis.  The infrarenal abdominal aorta is calcified but without stenosis.  Bilateral common and external iliac arteries are patent without stenosis  Right Lower Extremity:   The right common femoral and profundofemoral artery are patent throughout their course  without significant stenosis.  The SFA is diffusely diseased with several areas of greater than 80% stenosis.  The popliteal artery is patent throughout its course.  There is single-vessel runoff via the peroneal artery which collateralizes at the ankle to the posterior tibial.  Left Lower Extremity: The left common femoral and profundofemoral artery are patent without stenosis.  The superficial femoral artery is occluded.  There is reconstitution of the popliteal artery with single-vessel runoff via the peroneal artery.  Intervention: After the above images were acquired the decision was made to proceed with intervention.  A 6 French 45 cm sheath was inserted.  The patient was fully heparinized.  Using a Glidewire and a quick cross, the lesions were successfully crossed in the right superficial femoral artery.  A versa core wire was placed.  I elected to primarily stent the lesions.  A 7 x 120Elluvia stent was deployed and postdilated with a 6 mm balloon.  Follow-up imaging revealed resolution of the stenosis within the stented area.  Mynx was used for closure.  Impression:  #1  Multiple greater than 80% lesions within the mid superficial femoral artery on the right successfully treated using a 7 x 120 Elluvia stent with resolution of the stenosis  #2  Occlusion of the left superficial femoral artery.  The patient was brought back for percutaneous intervention of the left leg in the immediate future.  #3  Single-vessel runoff bilaterally via the peroneal artery.   [name] [name], M.D., [facility] [place] Office: [phone] Pager:  [phone]

## 2019-03-21 NOTE — Discharge Instructions (Signed)

## 2019-03-21 NOTE — Interval H&P Note (Signed)
History and Physical Interval Note:  03/21/2019 8:35 AM  [name]  has presented today for surgery, with the diagnosis of pvd - rest pain.  The various methods of treatment have been discussed with the patient and family. After consideration of risks, benefits and other options for treatment, the patient has consented to  Procedure(s): ABDOMINAL AORTOGRAM W/LOWER EXTREMITY (N/A) as a surgical intervention.  The patient's history has been reviewed, patient examined, no change in status, stable for surgery.  I have reviewed the patient's chart and labs.  Questions were answered to the patient's satisfaction.     [name] [name]

## 2019-03-21 NOTE — Progress Notes (Signed)
Carelink Summary Report / Loop Recorder 

## 2019-03-22 ENCOUNTER — Other Ambulatory Visit: Payer: Self-pay | Admitting: *Deleted

## 2019-03-22 ENCOUNTER — Other Ambulatory Visit: Payer: Self-pay | Admitting: Family

## 2019-03-22 DIAGNOSIS — E785 Hyperlipidemia, unspecified: Secondary | ICD-10-CM

## 2019-03-22 DIAGNOSIS — E1169 Type 2 diabetes mellitus with other specified complication: Secondary | ICD-10-CM

## 2019-03-22 DIAGNOSIS — F331 Major depressive disorder, recurrent, moderate: Secondary | ICD-10-CM

## 2019-03-22 DIAGNOSIS — E1142 Type 2 diabetes mellitus with diabetic polyneuropathy: Secondary | ICD-10-CM

## 2019-03-22 NOTE — Progress Notes (Signed)
Call to patient with instructions for 04/18/2019 Angiogram. Instructed to be at [facility] admitting at 5:30 am. Must have a driver and caregiver for discharge to home. Hold Eliquis for 2 days prior to this procedure. Take 1/2 dose of insulin night prior and have protein bedtime snack. NPO past MN. Am of take heart and blood pressure medications with sips of water and check CBG. If CBG greater than 220 take 1/2 dose of usual sliding scale insulin prior to leaving the house. Verbalized understanding of instructions, read back and will call this office if questions.

## 2019-03-22 NOTE — Telephone Encounter (Signed)
[place]. NTBS for 3 mos ckup from June 30 days given 02/22/19

## 2019-03-23 DIAGNOSIS — R0902 Hypoxemia: Secondary | ICD-10-CM | POA: Diagnosis not present

## 2019-03-23 DIAGNOSIS — J449 Chronic obstructive pulmonary disease, unspecified: Secondary | ICD-10-CM | POA: Diagnosis not present

## 2019-03-28 ENCOUNTER — Encounter: Payer: Medicare Other | Admitting: *Deleted

## 2019-03-28 ENCOUNTER — Other Ambulatory Visit: Payer: Self-pay

## 2019-03-28 VITALS — BP 166/86 | HR 80 | Wt 299.2 lb

## 2019-03-28 DIAGNOSIS — Z006 Encounter for examination for normal comparison and control in clinical research program: Secondary | ICD-10-CM

## 2019-03-29 ENCOUNTER — Other Ambulatory Visit: Payer: Self-pay | Admitting: Family

## 2019-03-29 DIAGNOSIS — F331 Major depressive disorder, recurrent, moderate: Secondary | ICD-10-CM

## 2019-03-29 DIAGNOSIS — E1142 Type 2 diabetes mellitus with diabetic polyneuropathy: Secondary | ICD-10-CM

## 2019-03-29 NOTE — Telephone Encounter (Signed)
What is the name of the medication? gabapentin (NEURONTIN) 300 MG capsule, telmisartan (MICARDIS) 40 MG tablet, DULoxetine (CYMBALTA) 30 MG capsule  Patient is completely out Have you contacted your pharmacy to request a refill? Yes last week and they have not heard back from office  Which pharmacy would you like this sent to? [facility] [place]    Patient notified that their request is being sent to the clinical staff for review and that they should receive a call once it is complete. If they do not receive a call within 24 hours they can check with their pharmacy or our office.

## 2019-04-03 NOTE — Research (Signed)
Subject to research clinic for visit [phone] in the clear research study.  AE reported to sponsor.  New meds dispensed, next phone call and clinic visit scheduled.

## 2019-04-04 DIAGNOSIS — Z79891 Long term (current) use of opiate analgesic: Secondary | ICD-10-CM | POA: Diagnosis not present

## 2019-04-04 DIAGNOSIS — M47816 Spondylosis without myelopathy or radiculopathy, lumbar region: Secondary | ICD-10-CM | POA: Diagnosis not present

## 2019-04-04 DIAGNOSIS — Z5181 Encounter for therapeutic drug level monitoring: Secondary | ICD-10-CM | POA: Diagnosis not present

## 2019-04-04 DIAGNOSIS — M170 Bilateral primary osteoarthritis of knee: Secondary | ICD-10-CM | POA: Diagnosis not present

## 2019-04-04 DIAGNOSIS — M5416 Radiculopathy, lumbar region: Secondary | ICD-10-CM | POA: Diagnosis not present

## 2019-04-04 DIAGNOSIS — G894 Chronic pain syndrome: Secondary | ICD-10-CM | POA: Diagnosis not present

## 2019-04-05 ENCOUNTER — Other Ambulatory Visit: Payer: Self-pay | Admitting: Family

## 2019-04-11 ENCOUNTER — Ambulatory Visit (INDEPENDENT_AMBULATORY_CARE_PROVIDER_SITE_OTHER): Payer: Medicare Other | Admitting: *Deleted

## 2019-04-11 DIAGNOSIS — I6381 Other cerebral infarction due to occlusion or stenosis of small artery: Secondary | ICD-10-CM | POA: Diagnosis not present

## 2019-04-11 LAB — CUP PACEART REMOTE DEVICE CHECK
Date Time Interrogation Session: 20201124105851
Implantable Pulse Generator Implant Date: 20180108

## 2019-04-14 ENCOUNTER — Other Ambulatory Visit (HOSPITAL_COMMUNITY)
Admission: RE | Admit: 2019-04-14 | Discharge: 2019-04-14 | Disposition: A | Discharge status: Home / Self Care | Payer: Medicare Other | Source: Ambulatory Visit | Attending: Surgery | Admitting: Surgery

## 2019-04-14 DIAGNOSIS — Z20828 Contact with and (suspected) exposure to other viral communicable diseases: Secondary | ICD-10-CM | POA: Diagnosis not present

## 2019-04-14 DIAGNOSIS — Z01812 Encounter for preprocedural laboratory examination: Secondary | ICD-10-CM | POA: Diagnosis not present

## 2019-04-14 LAB — SARS CORONAVIRUS 2 (TAT 6-24 HRS): SARS Coronavirus 2: NEGATIVE

## 2019-04-18 ENCOUNTER — Other Ambulatory Visit: Payer: Self-pay

## 2019-04-18 ENCOUNTER — Encounter (HOSPITAL_COMMUNITY): Payer: Self-pay | Admitting: Surgery

## 2019-04-18 ENCOUNTER — Encounter (HOSPITAL_COMMUNITY)
Admission: RE | Disposition: A | Discharge status: Home / Self Care | Payer: Self-pay | Source: Ambulatory Visit | Attending: Surgery

## 2019-04-18 ENCOUNTER — Ambulatory Visit (HOSPITAL_COMMUNITY)
Admission: RE | Admit: 2019-04-18 | Discharge: 2019-04-18 | Disposition: A | Discharge status: Home / Self Care | Payer: Medicare Other | Source: Ambulatory Visit | Attending: Surgery | Admitting: Surgery

## 2019-04-18 DIAGNOSIS — I10 Essential (primary) hypertension: Secondary | ICD-10-CM | POA: Diagnosis not present

## 2019-04-18 DIAGNOSIS — K219 Gastro-esophageal reflux disease without esophagitis: Secondary | ICD-10-CM | POA: Diagnosis not present

## 2019-04-18 DIAGNOSIS — Z7901 Long term (current) use of anticoagulants: Secondary | ICD-10-CM | POA: Diagnosis not present

## 2019-04-18 DIAGNOSIS — F329 Major depressive disorder, single episode, unspecified: Secondary | ICD-10-CM | POA: Diagnosis not present

## 2019-04-18 DIAGNOSIS — Z8249 Family history of ischemic heart disease and other diseases of the circulatory system: Secondary | ICD-10-CM | POA: Diagnosis not present

## 2019-04-18 DIAGNOSIS — M1990 Unspecified osteoarthritis, unspecified site: Secondary | ICD-10-CM | POA: Insufficient documentation

## 2019-04-18 DIAGNOSIS — I252 Old myocardial infarction: Secondary | ICD-10-CM | POA: Diagnosis not present

## 2019-04-18 DIAGNOSIS — Z882 Allergy status to sulfonamides status: Secondary | ICD-10-CM | POA: Diagnosis not present

## 2019-04-18 DIAGNOSIS — I2510 Atherosclerotic heart disease of native coronary artery without angina pectoris: Secondary | ICD-10-CM | POA: Insufficient documentation

## 2019-04-18 DIAGNOSIS — E1151 Type 2 diabetes mellitus with diabetic peripheral angiopathy without gangrene: Secondary | ICD-10-CM | POA: Insufficient documentation

## 2019-04-18 DIAGNOSIS — E785 Hyperlipidemia, unspecified: Secondary | ICD-10-CM | POA: Diagnosis not present

## 2019-04-18 DIAGNOSIS — Z794 Long term (current) use of insulin: Secondary | ICD-10-CM | POA: Insufficient documentation

## 2019-04-18 DIAGNOSIS — E7800 Pure hypercholesterolemia, unspecified: Secondary | ICD-10-CM | POA: Diagnosis not present

## 2019-04-18 DIAGNOSIS — I4891 Unspecified atrial fibrillation: Secondary | ICD-10-CM | POA: Diagnosis not present

## 2019-04-18 DIAGNOSIS — Z79899 Other long term (current) drug therapy: Secondary | ICD-10-CM | POA: Insufficient documentation

## 2019-04-18 DIAGNOSIS — Z7982 Long term (current) use of aspirin: Secondary | ICD-10-CM | POA: Insufficient documentation

## 2019-04-18 DIAGNOSIS — F419 Anxiety disorder, unspecified: Secondary | ICD-10-CM | POA: Diagnosis not present

## 2019-04-18 DIAGNOSIS — I70213 Atherosclerosis of native arteries of extremities with intermittent claudication, bilateral legs: Secondary | ICD-10-CM

## 2019-04-18 DIAGNOSIS — Z87891 Personal history of nicotine dependence: Secondary | ICD-10-CM | POA: Insufficient documentation

## 2019-04-18 DIAGNOSIS — Z888 Allergy status to other drugs, medicaments and biological substances status: Secondary | ICD-10-CM | POA: Diagnosis not present

## 2019-04-18 HISTORY — PX: PERIPHERAL VASCULAR BALLOON ANGIOPLASTY: CATH118281

## 2019-04-18 HISTORY — PX: LOWER EXTREMITY ANGIOGRAPHY: CATH118251

## 2019-04-18 LAB — POCT I-STAT, CHEM 8
BUN: 26 mg/dL — ABNORMAL HIGH (ref 8–23)
Calcium, Ion: 1.18 mmol/L (ref 1.15–1.40)
Chloride: 100 mmol/L (ref 98–111)
Creatinine, Ser: 1.5 mg/dL — ABNORMAL HIGH (ref 0.61–1.24)
Glucose, Bld: 159 mg/dL — ABNORMAL HIGH (ref 70–99)
HCT: 40 % (ref 39.0–52.0)
Hemoglobin: 13.6 g/dL (ref 13.0–17.0)
Potassium: 4.1 mmol/L (ref 3.5–5.1)
Sodium: 137 mmol/L (ref 135–145)
TCO2: 27 mmol/L (ref 22–32)

## 2019-04-18 LAB — POCT ACTIVATED CLOTTING TIME: Activated Clotting Time: 268 seconds

## 2019-04-18 SURGERY — LOWER EXTREMITY ANGIOGRAPHY
Anesthesia: LOCAL

## 2019-04-18 MED ORDER — MORPHINE SULFATE (PF) 2 MG/ML IV SOLN: 2.0000 mg | INTRAVENOUS | Status: DC | PRN

## 2019-04-18 MED ORDER — LIDOCAINE HCL (PF) 1 % IJ SOLN
INTRAMUSCULAR | Status: DC | PRN
  Administered 2019-04-18: 20 mL via INTRADERMAL

## 2019-04-18 MED ORDER — FENTANYL CITRATE (PF) 100 MCG/2ML IJ SOLN
INTRAMUSCULAR | Status: AC
  Filled 2019-04-18: qty 2

## 2019-04-18 MED ORDER — HYDRALAZINE HCL 20 MG/ML IJ SOLN: 5.0000 mg | INTRAMUSCULAR | Status: DC | PRN

## 2019-04-18 MED ORDER — FENTANYL CITRATE (PF) 100 MCG/2ML IJ SOLN
INTRAMUSCULAR | Status: DC | PRN
  Administered 2019-04-18 (×2): 25 ug via INTRAVENOUS
  Administered 2019-04-18: 50 ug via INTRAVENOUS

## 2019-04-18 MED ORDER — MIDAZOLAM HCL 2 MG/2ML IJ SOLN
INTRAMUSCULAR | Status: AC
  Filled 2019-04-18: qty 2

## 2019-04-18 MED ORDER — LABETALOL HCL 5 MG/ML IV SOLN: 10.0000 mg | INTRAVENOUS | Status: DC | PRN

## 2019-04-18 MED ORDER — SODIUM CHLORIDE 0.9 % WEIGHT BASED INFUSION: 1.0000 mL/kg/h | INTRAVENOUS | Status: DC

## 2019-04-18 MED ORDER — SODIUM CHLORIDE 0.9 % IV SOLN
INTRAVENOUS | Status: DC
  Administered 2019-04-18: 06:00:00 via INTRAVENOUS

## 2019-04-18 MED ORDER — HEPARIN SODIUM (PORCINE) 1000 UNIT/ML IJ SOLN
INTRAMUSCULAR | Status: DC | PRN
  Administered 2019-04-18: 10000 [IU] via INTRAVENOUS

## 2019-04-18 MED ORDER — IODIXANOL 320 MG/ML IV SOLN
INTRAVENOUS | Status: DC | PRN
  Administered 2019-04-18: 30 mL via INTRA_ARTERIAL

## 2019-04-18 MED ORDER — MIDAZOLAM HCL 2 MG/2ML IJ SOLN
INTRAMUSCULAR | Status: DC | PRN
  Administered 2019-04-18: 1 mg via INTRAVENOUS
  Administered 2019-04-18: 2 mg via INTRAVENOUS
  Administered 2019-04-18: 1 mg via INTRAVENOUS

## 2019-04-18 MED ORDER — SODIUM CHLORIDE 0.9% FLUSH: 3.0000 mL | INTRAVENOUS | Status: DC | PRN

## 2019-04-18 MED ORDER — ACETAMINOPHEN 325 MG PO TABS: 650.0000 mg | ORAL_TABLET | ORAL | Status: DC | PRN

## 2019-04-18 MED ORDER — HEPARIN (PORCINE) IN NACL 1000-0.9 UT/500ML-% IV SOLN
INTRAVENOUS | Status: AC
  Filled 2019-04-18: qty 500

## 2019-04-18 MED ORDER — ONDANSETRON HCL 4 MG/2ML IJ SOLN: 4.0000 mg | Freq: Four times a day (QID) | INTRAMUSCULAR | Status: DC | PRN

## 2019-04-18 MED ORDER — LIDOCAINE HCL (PF) 1 % IJ SOLN
INTRAMUSCULAR | Status: AC
  Filled 2019-04-18: qty 30

## 2019-04-18 MED ORDER — SODIUM CHLORIDE 0.9% FLUSH: 3.0000 mL | Freq: Two times a day (BID) | INTRAVENOUS | Status: DC

## 2019-04-18 MED ORDER — OXYCODONE HCL 5 MG PO TABS: 5.0000 mg | ORAL_TABLET | ORAL | Status: DC | PRN

## 2019-04-18 MED ORDER — SODIUM CHLORIDE 0.9 % IV SOLN: 250.0000 mL | INTRAVENOUS | Status: DC | PRN

## 2019-04-18 MED ORDER — HEPARIN (PORCINE) IN NACL 1000-0.9 UT/500ML-% IV SOLN
INTRAVENOUS | Status: DC | PRN
  Administered 2019-04-18 (×2): 500 mL

## 2019-04-18 SURGICAL SUPPLY — 27 items
CATH ANGIO 5F BER2 65CM (CATHETERS) ×1 IMPLANT
CATH OMNI FLUSH 5F 65CM (CATHETERS) ×1 IMPLANT
CATH QUICKCROSS SUPP .035X90CM (MICROCATHETER) ×1 IMPLANT
CATH SOFT-VU 4F 65 STRAIGHT (CATHETERS) IMPLANT
CATH SOFT-VU STRAIGHT 4F 65CM (CATHETERS) ×3
CATH VIANCE CROSS STAND 150CM (MICROCATHETER) ×3
CATH VIANCE CROSS STD 150CM (MICROCATHETER) IMPLANT
CLOSURE MYNX CONTROL 6F/7F (Vascular Products) ×1 IMPLANT
DEVICE TORQUE H2O (MISCELLANEOUS) ×1 IMPLANT
GLIDEWIRE ADV .035X260CM (WIRE) ×1 IMPLANT
GUIDEWIRE ANGLED .035X150CM (WIRE) ×1 IMPLANT
KIT ENCORE 26 ADVANTAGE (KITS) ×1 IMPLANT
KIT MICROPUNCTURE NIT STIFF (SHEATH) ×1 IMPLANT
KIT PV (KITS) ×3 IMPLANT
PAD PRO RADIOLUCENT 2001M-C (PAD) ×1 IMPLANT
SHEATH HIGHFLEX ANSEL 7FR 55CM (SHEATH) ×1 IMPLANT
SHEATH PINNACLE 5F 10CM (SHEATH) ×1 IMPLANT
SHEATH PINNACLE 7F 10CM (SHEATH) ×1 IMPLANT
SHEATH PINNACLE MP 7F 45CM (SHEATH) ×1 IMPLANT
SHEATH PROBE COVER 6X72 (BAG) ×1 IMPLANT
SYR MEDRAD MARK V 150ML (SYRINGE) ×1 IMPLANT
TRANSDUCER W/STOPCOCK (MISCELLANEOUS) ×3 IMPLANT
TRAY PV CATH (CUSTOM PROCEDURE TRAY) ×3 IMPLANT
WIRE AMPLATZ SS-J .035X180CM (WIRE) ×1 IMPLANT
WIRE BENTSON .035X145CM (WIRE) ×1 IMPLANT
WIRE ROSEN-J .035X260CM (WIRE) ×1 IMPLANT
WIRE SPARTACORE .014X300CM (WIRE) ×1 IMPLANT

## 2019-04-18 NOTE — Discharge Instructions (Signed)
Femoral Site Care °This sheet gives you information about how to care for yourself after your procedure. Your health care provider may also give you more specific instructions. If you have problems or questions, contact your health care provider. °What can I expect after the procedure? °After the procedure, it is common to have: °· Bruising that usually fades within 1-2 weeks. °· Tenderness at the site. °Follow these instructions at home: °Wound care °· Follow instructions from your health care provider about how to take care of your insertion site. Make sure you: °? Wash your hands with soap and water before you change your bandage (dressing). If soap and water are not available, use hand sanitizer. °? Change your dressing as told by your health care provider. °? Leave stitches (sutures), skin glue, or adhesive strips in place. These skin closures may need to stay in place for 2 weeks or longer. If adhesive strip edges start to loosen and curl up, you may trim the loose edges. Do not remove adhesive strips completely unless your health care provider tells you to do that. °· Do not take baths, swim, or use a hot tub until your health care provider approves. °· You may shower 24-48 hours after the procedure or as told by your health care provider. °? Gently wash the site with plain soap and water. °? Pat the area dry with a clean towel. °? Do not rub the site. This may cause bleeding. °· Do not apply powder or lotion to the site. Keep the site clean and dry. °· Check your femoral site every day for signs of infection. Check for: °? Redness, swelling, or pain. °? Fluid or blood. °? Warmth. °? Pus or a bad smell. °Activity °· For the first 2-3 days after your procedure, or as long as directed: °? Avoid climbing stairs as much as possible. °? Do not squat. °· Do not lift anything that is heavier than 10 lb (4.5 kg), or the limit that you are told, until your health care provider says that it is safe. °· Rest as  directed. °? Avoid sitting for a long time without moving. Get up to take short walks every 1-2 hours. °· Do not drive for 24 hours if you were given a medicine to help you relax (sedative). °General instructions °· Take over-the-counter and prescription medicines only as told by your health care provider. °· Keep all follow-up visits as told by your health care provider. This is important. °Contact a health care provider if you have: °· A fever or chills. °· You have redness, swelling, or pain around your insertion site. °Get help right away if: °· The catheter insertion area swells very fast. °· You pass out. °· You suddenly start to sweat or your skin gets clammy. °· The catheter insertion area is bleeding, and the bleeding does not stop when you hold steady pressure on the area. °· The area near or just beyond the catheter insertion site becomes pale, cool, tingly, or numb. °These symptoms may represent a serious problem that is an emergency. Do not wait to see if the symptoms will go away. Get medical help right away. Call your local emergency services (911 in the [place] Do not drive yourself to the hospital. °Summary °· After the procedure, it is common to have bruising that usually fades within 1-2 weeks. °· Check your femoral site every day for signs of infection. °· Do not lift anything that is heavier than 10 lb (4.5 kg), or the   limit that you are told, until your health care provider says that it is safe. °This information is not intended to replace advice given to you by your health care provider. Make sure you discuss any questions you have with your health care provider. °Document Released: 01/05/2014 Document Revised: 05/17/2017 Document Reviewed: 05/17/2017 °Elsevier Patient Education © 2020 [employer] ° °

## 2019-04-18 NOTE — H&P (Signed)
Vascular and Vein Specialist of [facility]  Patient name: [name] [name] MRN: [id number] DOB: 1950/07/10 Sex: male  REQUESTING PROVIDER:  [name]  REASON FOR CONSULT:  PAD  HISTORY OF PRESENT ILLNESS:  [name] [name] is a 68 y.o. male, who is referred today for evaluation of lower extremity vascular disease. I had previously seen him in 2016 for similar problem however at that time his ABIs were higher and his symptoms were more consistent with nerve damage from his back issues. Now, he has had a issues with progressive leg pain. This begins with minimal activity and is somewhat alleviated with rest. His right leg bothers him more than the left. He does not have any open wounds or rest pain. He quit smoking 3 years ago.  The patient has a history of coronary artery disease. He underwent DES in 2010 in 2018. He suffers from atrial fibrillation. He is on Eliquis and has a loop recorder implanted. He is statin and Repatha intolerant. He is currently involved in a research study to address his hypercholesterolemia. He is a diabetic.  PAST MEDICAL HISTORY       Past Medical History:  Diagnosis Date  . Anxiety   . Arthritis   . CAD (coronary artery disease)    a. 2010: DES to CTO of RCA. EF 55% b. 07/2016: cath showing total occlusion within previously placed RCA stent (collaterals present), severe stenosis along LCx and OM1 (treated with 2 overlapping DES). c. repeat cath in 01/2018 showing patent stents along LCx and OM with CTO of D2, CTO of distal LCx, and CTO of RCA with collaterals present overall unchanged since 2018 with medical management recom  . Cellulitis and abscess rt groin   . Depression   . Diabetes mellitus   . Disorders of iron metabolism   . GERD (gastroesophageal reflux disease)   . Hyperlipidemia   . Hypertension   . Low serum testosterone level   . Medically noncompliant   . Myocardial infarction [facility])    FAMILY HISTORY        Family History  Problem Relation Age of  Onset  . Diabetes Father   . Valvular heart disease Father   . Arthritis Father   . Heart disease Father   . Stroke Father   . Alzheimer's disease Mother   . Hyperlipidemia Mother   . Hypertension Mother   . Arthritis Mother   . Lung cancer Mother   . Stroke Mother   . Arthritis/Rheumatoid Sister   . Diabetes Sister   . Hypertension Sister   . Hyperlipidemia Sister   . Depression Sister   . Dementia Maternal Aunt   . Dementia Maternal Uncle   . Heart disease Maternal Uncle   . Stomach cancer Paternal Uncle   . Colon cancer Neg Hx   . Liver disease Neg Hx    SOCIAL HISTORY:   Social History        Socioeconomic History  . Marital status: Married    Spouse name: Not on file  . Number of children: 2  . Years of education: 26  . Highest education level: 12th grade  Occupational History  . Occupation: retired  Scientific laboratory technician  . Financial resource strain: Not hard at all  . Food insecurity    Worry: Patient refused    Inability: Patient refused  . Transportation needs    Medical: Patient refused    Non-medical: Patient refused  Tobacco Use  . Smoking status: Former Smoker  Packs/day: 0.25    Years: 51.00    Pack years: 12.75    Types: Cigarettes    Quit date: 08/14/2016    Years since quitting: 2.5  . Smokeless tobacco: Never Used  . Tobacco comment: smokes a pack a week  Substance and Sexual Activity  . Alcohol use: No    Alcohol/week: 0.0 standard drinks  . Drug use: No  . Sexual activity: Yes  Lifestyle  . Physical activity    Days per week: Patient refused    Minutes per session: Patient refused  . Stress: Not at all  Relationships  . Social Herbalist on phone: Patient refused    Gets together: Patient refused    Attends religious service: Patient refused    Active member of club or organization: Patient refused    Attends meetings of clubs or organizations: Patient refused    Relationship status: Patient refused  . Intimate partner  violence    Fear of current or ex partner: Patient refused    Emotionally abused: Patient refused    Physically abused: Patient refused    Forced sexual activity: Patient refused  Other Topics Concern  . Not on file  Social History Narrative   Lives with his wife. Retired. Unable to afford medicines.    ALLERGIES:        Allergies  Allergen Reactions  . Shellfish Allergy Anaphylaxis and Other (See Comments)    Tongue swelling, hives   . Sulfa Antibiotics Anaphylaxis and Rash    Tongue swelling, hives  . Ace Inhibitors Other (See Comments) and Cough    CKD, renal failure   . Invokana [Canagliflozin] Other (See Comments)    Syncope / dehydration  . Metformin And Related Itching  . Pravastatin Sodium Other (See Comments)    myalgias  . Fenofibrate Other (See Comments)    Body aches  . Iodine Other (See Comments)    ????. PATIENT SAYS HE DID FINE WITH LAST CT WITH CONTRAST** patient had IV contrast on 06/21/2018 without pre meds without any issues. Patient had allergic reaction to Shellfish 30 years ago-not to IV contrast.   . Lisinopril Cough  . Livalo [Pitavastatin] Other (See Comments)    Myalgias   . Repatha [Evolocumab]     Myalgias, flu like sx  . Crestor [Rosuvastatin] Other (See Comments)    Myalgias   . Horse-Derived Products Rash  . Lexapro [Escitalopram Oxalate] Other (See Comments)    Buzzing in ears,headache, felt like a zombie  . Lipitor [Atorvastatin] Other (See Comments)    myalgias  . Tape Rash   CURRENT MEDICATIONS:         Current Outpatient Medications  Medication Sig Dispense Refill  . apixaban (ELIQUIS) 5 MG TABS tablet TAKE 1 TABLET BY MOUTH TWICE DAILY . 60 tablet 3  . aspirin EC 81 MG tablet Take 81 mg by mouth daily.    . DULoxetine (CYMBALTA) 30 MG capsule Take 1 capsule (30 mg total) by mouth daily. (Needs to be seen before next refill) 30 capsule 0  . fenofibrate 160 MG tablet TAKE 1 TABLET BY MOUTH ONCE DAILY FOR CHOLESTEROL AND TRIGLYCERIDE  90 tablet 1  . furosemide (LASIX) 20 MG tablet Take 1 tablet by mouth twice daily (Patient taking differently: 20 mg every other day. ) 180 tablet 1  . gabapentin (NEURONTIN) 300 MG capsule Take 2 capsules (600 mg total) by mouth 3 (three) times daily. (Needs to be seen before next refill) 180  capsule 0  . HUMALOG KWIKPEN 100 UNIT/ML KwikPen INJECT 5 TO 20 UNITS SUBCUTANEOUSLY THREE TIMES DAILY WITH MEALS (Need to be seen) 15 mL 0  . HYDROcodone-acetaminophen (NORCO) 10-325 MG tablet Take 1 tablet by mouth every 12 (twelve) hours as needed. 60 tablet 0  . Insulin Degludec (TRESIBA FLEXTOUCH) 200 UNIT/ML SOPN Inject 36 Units into the skin at bedtime. 12 pen 2  . insulin NPH-regular Human (HUMULIN 70/30) (70-30) 100 UNIT/ML injection Inject 90 Units into the skin 2 (two) times daily with a meal. (Patient taking differently: Inject 100 Units into the skin 2 (two) times daily with a meal. ) 60 mL 12  . isosorbide mononitrate (IMDUR) 30 MG 24 hr tablet Take 1 tablet by mouth once daily 90 tablet 1  . metoprolol succinate (TOPROL-XL) 50 MG 24 hr tablet Take 1 tablet (50 mg total) by mouth daily. 30 tablet 11  . nitroGLYCERIN (NITROSTAT) 0.4 MG SL tablet Place 1 tablet (0.4 mg total) under the tongue every 5 (five) minutes x 3 doses as needed for chest pain. 25 tablet 3  . OZEMPIC, 1 MG/DOSE, 2 MG/1.5ML SOPN INJECT 1MG  SUBQ ONCE A WEEK 12 mL 0  . pantoprazole (PROTONIX) 40 MG tablet Take 1 tablet by mouth once daily 90 tablet 1  . telmisartan (MICARDIS) 40 MG tablet TAKE 1 TABLET BY MOUTH ONCE DAILY. REPLACING LISINOPRIL 90 tablet 1   No current facility-administered medications for this visit.    REVIEW OF SYSTEMS:  [X]  denotes positive finding, [ ]  denotes negative finding  Cardiac  Comments:  Chest pain or chest pressure:    Shortness of breath upon exertion: x   Short of breath when lying flat:    Irregular heart rhythm: x       Vascular    Pain in calf, thigh, or hip brought on by ambulation:     Pain in feet at night that wakes you up from your sleep:  x   Blood clot in your veins:    Leg swelling:  x       Pulmonary    Oxygen at home:    Productive cough:     Wheezing:         Neurologic    Sudden weakness in arms or legs:     Sudden numbness in arms or legs:     Sudden onset of difficulty speaking or slurred speech: x   Temporary loss of vision in one eye:     Problems with dizziness:  x       Gastrointestinal    Blood in stool:     Vomited blood:         Genitourinary    Burning when urinating:     Blood in urine:        Psychiatric    Major depression:         Hematologic    Bleeding problems:    Problems with blood clotting too easily:        Skin    Rashes or ulcers:        Constitutional    Fever or chills:     PHYSICAL EXAM:      Vitals:   03/06/19 1341  BP: (!) 157/91  Pulse: 85  Resp: 20  Temp: 97.8 F (36.6 C)  SpO2: 98%  Weight: 299 lb 11.2 oz (135.9 kg)  Height: 6\' 2"  (1.88 m)  GENERAL: The patient is a well-nourished male, in no acute distress. The vital  signs are documented above.  CARDIAC: There is a regular rate and rhythm.  VASCULAR: Nonpalpable pedal pulses  PULMONARY: Nonlabored respirations  ABDOMEN: Soft and non-tender with normal pitched bowel sounds.  MUSCULOSKELETAL: There are no major deformities or cyanosis.  NEUROLOGIC: No focal weakness or paresthesias are detected.  SKIN: There are no ulcers or rashes noted.  PSYCHIATRIC: The patient has a normal affect.  STUDIES:  I have reviewed the following:  +-------+-----------+-----------+------------+------------+  ABI/TBIToday's ABIToday's TBIPrevious ABIPrevious TBI  +-------+-----------+-----------+------------+------------+  Right 0.56 0.62 0.88 0.56   +-------+-----------+-----------+------------+------------+  Left 0.6 0.58 1.1 0.68   +-------+-----------+-----------+------------+------------+  ASSESSMENT and PLAN  Lower extremity  atherosclerotic vascular disease: We discussed the treatment options including medical management versus intervention. At this time his symptoms are so debilitating that I feel angiography is the next step. I will plan on addressing his right leg first there for a left groin cannulation will be performed. I will image both legs and intervene on the right if indicated. We discussed the risk of bleeding and distal embolization. All of his questions were answered. He will be scheduled for angiography in the immediate future. He will need to be off of his Eliquis prior to be procedure.  Leia Alf, MD, FACS  Vascular and Vein Specialists of [facility] [phone]  Pager [phone]

## 2019-04-18 NOTE — Op Note (Signed)
    Patient name: [name] [name] MRN: [id number] DOB: 1950/11/06 Sex: male  04/18/2019 Pre-operative Diagnosis: Bilateral claudication Post-operative diagnosis:  Same Surgeon:  Annamarie Major Procedure Performed:  1.  Ultrasound-guided access, right femoral artery  2.  Left leg runoff  3.  Failed angioplasty, left superficial femoral artery  4.  Closure device, Mynx  5.  Conscious sedation, 77 minutes    Indications: Earlier this month the patient underwent stenting of his right superficial femoral artery.  This has resolved his symptoms.  He now comes in today for intervention on the left.  Procedure:  The patient was identified in the holding area and taken to room 8.  The patient was then placed supine on the table and prepped and draped in the usual sterile fashion.  A time out was called.  Conscious sedation was administered with the use of IV fentanyl and Versed under continuous physician and nurse monitoring.  Heart rate, blood pressure, and oxygen saturation were continuously monitored.  Total sedation time was 77 minutes.  Ultrasound was used to evaluate the right common femoral artery.  It was patent .  A digital ultrasound image was acquired.  A micropuncture needle was used to access the right common femoral artery under ultrasound guidance.  An 018 wire was advanced without resistance and a micropuncture sheath was placed.  The 018 wire was removed and a benson wire was placed.  The micropuncture sheath was exchanged for a 5 french sheath.  An omniflush catheter was used to cross the aortic bifurcation.  Next over a Rosen wire, a 7 French 45 cm Terumo sheath was inserted.  The patient was fully heparinized.  Left leg runoff was performed.  Findings:   Left Lower Extremity: The common femoral profundofemoral artery are patent throughout the course.  The superficial femoral artery is patent proximally however occludes in the proximal thigh with reconstitution at the adductor canal.  The  popliteal artery is patent and there is single-vessel runoff via the peroneal artery  Intervention: At this point I elected to proceed with intervention.  Using a Glidewire and a quick cross catheter, the wire and catheter were navigated into the superficial femoral artery.  I met resistance in the proximal thigh.  I attempted to cross this lesion however I could not get through the proximal.  I tried the back end of a Glidewire as well as a Glidewire advantage and then ultimately I tried the Viance catheter however I was unable to make any progress to getting across the proximal cap.  I did not want to come from the foot given his single-vessel peroneal runoff.  Therefore elected to terminate procedure.  Catheters and wires were removed.  The sheath was removed and a minx device was used to close the arteriotomy.  There were no complications  Impression:  #1  Unsuccessful attempt at crossing left superficial femoral artery occlusion  #2  Patient has single-vessel runoff via the peroneal artery.  He would be a candidate for a femoral-popliteal bypass graft.    Theotis Burrow, M.D., [facility] V[facility] and Vein Specialists of [place] Office: [phone] Pager:  [phone]

## 2019-04-18 NOTE — Progress Notes (Signed)
No bleeding or hematoma noted after ambulation 

## 2019-04-19 ENCOUNTER — Other Ambulatory Visit: Payer: Self-pay | Admitting: Family

## 2019-04-19 NOTE — Telephone Encounter (Signed)
[place]. NTBS 30 days given 03/29/19

## 2019-04-21 ENCOUNTER — Other Ambulatory Visit: Payer: Self-pay | Admitting: Family

## 2019-04-21 DIAGNOSIS — E1142 Type 2 diabetes mellitus with diabetic polyneuropathy: Secondary | ICD-10-CM

## 2019-04-26 ENCOUNTER — Other Ambulatory Visit: Payer: Self-pay | Admitting: Family

## 2019-04-26 ENCOUNTER — Other Ambulatory Visit: Payer: Self-pay

## 2019-04-26 DIAGNOSIS — K219 Gastro-esophageal reflux disease without esophagitis: Secondary | ICD-10-CM

## 2019-04-27 ENCOUNTER — Ambulatory Visit (INDEPENDENT_AMBULATORY_CARE_PROVIDER_SITE_OTHER): Payer: Medicare Other | Admitting: Family

## 2019-04-27 ENCOUNTER — Encounter: Payer: Self-pay | Admitting: Family

## 2019-04-27 ENCOUNTER — Telehealth: Payer: Self-pay | Admitting: Neurology

## 2019-04-27 VITALS — BP 131/74 | HR 73 | Temp 97.5°F | Ht 74.0 in | Wt 297.0 lb

## 2019-04-27 DIAGNOSIS — E0841 Diabetes mellitus due to underlying condition with diabetic mononeuropathy: Secondary | ICD-10-CM

## 2019-04-27 DIAGNOSIS — N189 Chronic kidney disease, unspecified: Secondary | ICD-10-CM

## 2019-04-27 DIAGNOSIS — I480 Paroxysmal atrial fibrillation: Secondary | ICD-10-CM

## 2019-04-27 DIAGNOSIS — Z794 Long term (current) use of insulin: Secondary | ICD-10-CM | POA: Diagnosis not present

## 2019-04-27 DIAGNOSIS — I2510 Atherosclerotic heart disease of native coronary artery without angina pectoris: Secondary | ICD-10-CM | POA: Diagnosis not present

## 2019-04-27 DIAGNOSIS — Z9861 Coronary angioplasty status: Secondary | ICD-10-CM

## 2019-04-27 DIAGNOSIS — E1142 Type 2 diabetes mellitus with diabetic polyneuropathy: Secondary | ICD-10-CM | POA: Diagnosis not present

## 2019-04-27 DIAGNOSIS — E1159 Type 2 diabetes mellitus with other circulatory complications: Secondary | ICD-10-CM | POA: Diagnosis not present

## 2019-04-27 DIAGNOSIS — F331 Major depressive disorder, recurrent, moderate: Secondary | ICD-10-CM

## 2019-04-27 DIAGNOSIS — K219 Gastro-esophageal reflux disease without esophagitis: Secondary | ICD-10-CM

## 2019-04-27 DIAGNOSIS — I10 Essential (primary) hypertension: Secondary | ICD-10-CM | POA: Diagnosis not present

## 2019-04-27 DIAGNOSIS — Z125 Encounter for screening for malignant neoplasm of prostate: Secondary | ICD-10-CM

## 2019-04-27 DIAGNOSIS — E1169 Type 2 diabetes mellitus with other specified complication: Secondary | ICD-10-CM

## 2019-04-27 DIAGNOSIS — F419 Anxiety disorder, unspecified: Secondary | ICD-10-CM

## 2019-04-27 DIAGNOSIS — E785 Hyperlipidemia, unspecified: Secondary | ICD-10-CM

## 2019-04-27 LAB — BAYER DCA HB A1C WAIVED: HB A1C (BAYER DCA - WAIVED): 7 % — ABNORMAL HIGH (ref <7.0)

## 2019-04-27 NOTE — Progress Notes (Signed)
Subjective:    Patient ID: [name] [name], male    DOB: July 26, 1950, 68 y.o.   MRN: [id number]  Chief Complaint  Patient presents with  . Medical Management of Chronic Issues    PT presents to the office today for chronic follow up. PT had NSTEMI on 07/30/16 and stent placed. Pt is followed by Cardiologistsevery 4 months. Pt currently in a clinical trial for hyperlipidemia. He is followed by Ortho for osteoarthritis and is currently getting injections in bilateral knees.   He is followed by Vascular and had stent placed in his right leg.  Diabetes He presents for his follow-up diabetic visit. He has type 2 diabetes mellitus. His disease course has been stable. Hypoglycemia symptoms include nervousness/anxiousness. Pertinent negatives for hypoglycemia include no confusion or hunger. Associated symptoms include foot paresthesias. Pertinent negatives for diabetes include no blurred vision. There are no hypoglycemic complications. Symptoms are stable. Diabetic complications include heart disease, nephropathy and peripheral neuropathy. Risk factors for coronary artery disease include dyslipidemia, diabetes mellitus, family history, male sex, hypertension and sedentary lifestyle. He is following a generally unhealthy diet. His overall blood glucose range is 140-180 mg/dl.  Hypertension This is a chronic problem. The current episode started more than 1 year ago. The problem has been resolved since onset. The problem is controlled. Associated symptoms include anxiety, malaise/fatigue and peripheral edema ("Every now and then). Pertinent negatives include no blurred vision or shortness of breath. Risk factors for coronary artery disease include dyslipidemia, diabetes mellitus, obesity, male gender and sedentary lifestyle. The current treatment provides moderate improvement. Hypertensive end-organ damage includes CAD/MI.  Hyperlipidemia This is a chronic problem. The current episode started more than 1 year  ago. The problem is uncontrolled. Recent lipid tests were reviewed and are high. Exacerbating diseases include obesity. Pertinent negatives include no shortness of breath. Current antihyperlipidemic treatment includes statins. The current treatment provides moderate improvement of lipids. Risk factors for coronary artery disease include diabetes mellitus, dyslipidemia, male sex, hypertension and a sedentary lifestyle.  Gastroesophageal Reflux He complains of heartburn. He reports no belching. This is a chronic problem. The current episode started more than 1 year ago. The problem occurs occasionally. The problem has been waxing and waning. Risk factors include obesity. He has tried a PPI for the symptoms. The treatment provided moderate relief.  Arthritis Presents for follow-up visit. He complains of pain and stiffness. The symptoms have been stable. Affected locations include the right knee and left knee (back). His pain is at a severity of 1/10.  Depression        This is a chronic problem.  The current episode started more than 1 year ago.   The onset quality is gradual.   The problem occurs intermittently.  The problem has been waxing and waning since onset.  Associated symptoms include irritable, decreased interest and sad.  Associated symptoms include no helplessness and no hopelessness.     The symptoms are aggravated by medication.  Past medical history includes anxiety.   Anxiety Presents for follow-up visit. Symptoms include depressed mood, excessive worry, irritability and nervous/anxious behavior. Patient reports no confusion or shortness of breath. Symptoms occur occasionally. The severity of symptoms is moderate.    Diabetic Neuropathy Pt states his numbness is improving since his stent was placed in his right leg. He is currently taking Gabapentin 300 mg TID helps his burning pain.     Review of Systems  Constitutional: Positive for irritability and malaise/fatigue.  Eyes: Negative  for blurred  vision.  Respiratory: Negative for shortness of breath.   Gastrointestinal: Positive for heartburn.  Musculoskeletal: Positive for arthritis and stiffness.  Psychiatric/Behavioral: Positive for depression. Negative for confusion. The patient is nervous/anxious.   All other systems reviewed and are negative.      Objective:   Physical Exam Vitals reviewed.  Constitutional:      General: He is irritable. He is not in acute distress.    Appearance: He is well-developed.  HENT:     Head: Normocephalic.     Right Ear: Tympanic membrane normal.     Left Ear: Tympanic membrane normal.  Eyes:     General:        Right eye: No discharge.        Left eye: No discharge.     Pupils: Pupils are equal, round, and reactive to light.  Neck:     Thyroid: No thyromegaly.  Cardiovascular:     Rate and Rhythm: Normal rate and regular rhythm.     Heart sounds: Normal heart sounds. No murmur.  Pulmonary:     Effort: Pulmonary effort is normal. No respiratory distress.     Breath sounds: Normal breath sounds. No wheezing.  Abdominal:     General: Bowel sounds are normal. There is no distension.     Palpations: Abdomen is soft.     Tenderness: There is no abdominal tenderness.  Musculoskeletal:        General: No tenderness. Normal range of motion.     Cervical back: Normal range of motion and neck supple.  Skin:    General: Skin is warm and dry.     Findings: No erythema or rash.  Neurological:     Mental Status: He is alert and oriented to person, place, and time.     Cranial Nerves: No cranial nerve deficit.     Deep Tendon Reflexes: Reflexes are normal and symmetric.  Psychiatric:        Behavior: Behavior normal.        Thought Content: Thought content normal.        Judgment: Judgment normal.     See Diabetic foot note     BP 131/74   Pulse 73   Temp (!) 97.5 F (36.4 C) (Temporal)   Ht _0  (1.88 m)   Wt 297 lb (134.7 kg)   SpO2 99%   BMI 38.13 kg/m    Assessment & Plan:  [name] [name] comes in today with chief complaint of Medical Management of Chronic Issues   Diagnosis and orders addressed:  1. Hypertension associated with diabetes ([place]) - CMP14+EGFR - CBC with Differential/Platelet  2. Paroxysmal atrial fibrillation (HCC) - CMP14+EGFR - CBC with Differential/Platelet  3. CAD S/P percutaneous coronary angioplasty - CMP14+EGFR - CBC with Differential/Platelet  4. Gastroesophageal reflux disease without esophagitis - CMP14+EGFR - CBC with Differential/Platelet  5. Hyperlipidemia associated with type 2 diabetes mellitus (HCC)  - CMP14+EGFR - CBC with Differential/Platelet  6. Diabetic peripheral neuropathy associated with type 2 diabetes mellitus (HCC) - CMP14+EGFR - CBC with Differential/Platelet  7. Diabetic mononeuropathy associated with diabetes mellitus due to underlying condition ([place]) - CMP14+EGFR - CBC with Differential/Platelet - hgba1c - Microalbumin / creatinine urine ratio  8. Type 2 diabetes mellitus with diabetic polyneuropathy, with long-term current use of insulin (HCC) - CMP14+EGFR - CBC with Differential/Platelet - hgba1c - Microalbumin / creatinine urine ratio  9. Moderate episode of recurrent major depressive disorder (HCC) - CMP14+EGFR - CBC with Differential/Platelet  10.  Morbid obesity ([place]) - CMP14+EGFR - CBC with Differential/Platelet  11. Anxiety - CMP14+EGFR - CBC with Differential/Platelet  12. Prostate cancer screening  - CMP14+EGFR - CBC with Differential/Platelet - PSA, total and free   Labs pending Health Maintenance reviewed Diet and exercise encouraged  Follow up plan: 3 months    [name], FNP

## 2019-04-27 NOTE — Telephone Encounter (Signed)
Pt's wife [name] on [name] called wanting to know if the pt's oxygen test results have come in. Please advise.

## 2019-04-27 NOTE — Patient Instructions (Signed)

## 2019-04-27 NOTE — Telephone Encounter (Signed)
Received the report, will give to Dr Jaynee Eagles to review and result.

## 2019-04-27 NOTE — Telephone Encounter (Signed)
Reviewed the pt ONO with Dr Jaynee Eagles and it indicated the patient did not qualify for oxygen needs I called the patient and advised him of these results. Pt asked if this means he doesn't have apnea. I informed the pt that it unfortunately is not able to tell us if apnea is present or not, but what it does mean if that if he is having apnea events it is not affecting his oxygen level. Pt verbalized understanding and was appreciative for the all. Advised the patient to follow up if symptoms fail to improve.

## 2019-04-27 NOTE — Telephone Encounter (Signed)
I have reached out to [facility] to check on the results. I have not seen them come through to me as of this moment. Unsure if Dr Jaynee Eagles received the results. [facility] is check on this for me and I have given them my fax number to fax the results to me.

## 2019-04-28 LAB — MICROALBUMIN / CREATININE URINE RATIO
Creatinine, Urine: 52.1 mg/dL
Microalb/Creat Ratio: 77 mg/g creat — ABNORMAL HIGH (ref 0–29)
Microalbumin, Urine: 40 ug/mL

## 2019-05-01 LAB — CMP14+EGFR
ALT: 19 IU/L (ref 0–44)
AST: 24 IU/L (ref 0–40)
Albumin/Globulin Ratio: 1.5 (ref 1.2–2.2)
Albumin: 4.4 g/dL (ref 3.8–4.8)
Alkaline Phosphatase: 73 IU/L (ref 39–117)
BUN/Creatinine Ratio: 14 (ref 10–24)
BUN: 22 mg/dL (ref 8–27)
Bilirubin Total: 0.7 mg/dL (ref 0.0–1.2)
CO2: 24 mmol/L (ref 20–29)
Calcium: 10.2 mg/dL (ref 8.6–10.2)
Chloride: 96 mmol/L (ref 96–106)
Creatinine, Ser: 1.57 mg/dL — ABNORMAL HIGH (ref 0.76–1.27)
GFR calc Af Amer: 52 mL/min/{1.73_m2} — ABNORMAL LOW (ref >59)
GFR calc non Af Amer: 45 mL/min/{1.73_m2} — ABNORMAL LOW (ref >59)
Globulin, Total: 3 g/dL (ref 1.5–4.5)
Glucose: 93 mg/dL (ref 65–99)
Potassium: 4.6 mmol/L (ref 3.5–5.2)
Sodium: 136 mmol/L (ref 134–144)
Total Protein: 7.4 g/dL (ref 6.0–8.5)

## 2019-05-01 LAB — CBC WITH DIFFERENTIAL/PLATELET
Basophils Absolute: 0.1 10*3/uL (ref 0.0–0.2)
Basos: 1 %
EOS (ABSOLUTE): 0.3 10*3/uL (ref 0.0–0.4)
Eos: 3 %
Hematocrit: 42.4 % (ref 37.5–51.0)
Hemoglobin: 14.2 g/dL (ref 13.0–17.7)
Immature Grans (Abs): 0.1 10*3/uL (ref 0.0–0.1)
Immature Granulocytes: 1 %
Lymphocytes Absolute: 2.1 10*3/uL (ref 0.7–3.1)
Lymphs: 22 %
MCH: 29.2 pg (ref 26.6–33.0)
MCHC: 33.5 g/dL (ref 31.5–35.7)
MCV: 87 fL (ref 79–97)
Monocytes Absolute: 0.8 10*3/uL (ref 0.1–0.9)
Monocytes: 8 %
Neutrophils Absolute: 6.2 10*3/uL (ref 1.4–7.0)
Neutrophils: 65 %
Platelets: 329 10*3/uL (ref 150–450)
RBC: 4.87 x10E6/uL (ref 4.14–5.80)
RDW: 12.8 % (ref 11.6–15.4)
WBC: 9.5 10*3/uL (ref 3.4–10.8)

## 2019-05-01 LAB — PSA, TOTAL AND FREE
PSA, Free Pct: 33.3 %
PSA, Free: 0.4 ng/mL
Prostate Specific Ag, Serum: 1.2 ng/mL (ref 0.0–4.0)

## 2019-05-02 ENCOUNTER — Other Ambulatory Visit: Payer: Self-pay | Admitting: Family

## 2019-05-03 NOTE — Addendum Note (Signed)
Addended by: [name] A on: 05/03/2019 01:28 PM   Modules accepted: Orders

## 2019-05-05 ENCOUNTER — Telehealth (HOSPITAL_COMMUNITY): Payer: Self-pay

## 2019-05-05 NOTE — Telephone Encounter (Signed)

## 2019-05-07 ENCOUNTER — Other Ambulatory Visit: Payer: Self-pay

## 2019-05-07 DIAGNOSIS — I70213 Atherosclerosis of native arteries of extremities with intermittent claudication, bilateral legs: Secondary | ICD-10-CM

## 2019-05-08 ENCOUNTER — Encounter: Payer: Medicare Other | Admitting: Surgery

## 2019-05-08 ENCOUNTER — Inpatient Hospital Stay (HOSPITAL_COMMUNITY): Admission: RE | Admit: 2019-05-08 | Payer: Medicare Other | Source: Ambulatory Visit

## 2019-05-08 ENCOUNTER — Other Ambulatory Visit: Payer: Self-pay | Admitting: Family

## 2019-05-08 NOTE — Telephone Encounter (Signed)
Paperwork filled out on provider's desk for signature

## 2019-05-09 NOTE — Progress Notes (Signed)
ILR remote 

## 2019-05-10 NOTE — Telephone Encounter (Signed)
LMOVM paperwork faxed to [facility]

## 2019-05-15 ENCOUNTER — Ambulatory Visit (INDEPENDENT_AMBULATORY_CARE_PROVIDER_SITE_OTHER): Payer: Medicare Other | Admitting: *Deleted

## 2019-05-15 DIAGNOSIS — I6381 Other cerebral infarction due to occlusion or stenosis of small artery: Secondary | ICD-10-CM

## 2019-05-15 LAB — CUP PACEART REMOTE DEVICE CHECK
Date Time Interrogation Session: 20201227105702
Implantable Pulse Generator Implant Date: 20180108

## 2019-05-16 NOTE — Progress Notes (Signed)
ILR remote 

## 2019-05-23 DIAGNOSIS — G894 Chronic pain syndrome: Secondary | ICD-10-CM | POA: Diagnosis not present

## 2019-05-23 DIAGNOSIS — M170 Bilateral primary osteoarthritis of knee: Secondary | ICD-10-CM | POA: Diagnosis not present

## 2019-05-23 DIAGNOSIS — M5136 Other intervertebral disc degeneration, lumbar region: Secondary | ICD-10-CM | POA: Diagnosis not present

## 2019-05-23 DIAGNOSIS — M47816 Spondylosis without myelopathy or radiculopathy, lumbar region: Secondary | ICD-10-CM | POA: Diagnosis not present

## 2019-05-24 ENCOUNTER — Other Ambulatory Visit: Payer: Self-pay | Admitting: Family

## 2019-05-24 DIAGNOSIS — K219 Gastro-esophageal reflux disease without esophagitis: Secondary | ICD-10-CM

## 2019-05-26 DIAGNOSIS — I129 Hypertensive chronic kidney disease with stage 1 through stage 4 chronic kidney disease, or unspecified chronic kidney disease: Secondary | ICD-10-CM | POA: Diagnosis not present

## 2019-05-26 DIAGNOSIS — I5032 Chronic diastolic (congestive) heart failure: Secondary | ICD-10-CM | POA: Diagnosis not present

## 2019-05-26 DIAGNOSIS — N189 Chronic kidney disease, unspecified: Secondary | ICD-10-CM | POA: Diagnosis not present

## 2019-05-26 DIAGNOSIS — R809 Proteinuria, unspecified: Secondary | ICD-10-CM | POA: Diagnosis not present

## 2019-06-02 DIAGNOSIS — M47816 Spondylosis without myelopathy or radiculopathy, lumbar region: Secondary | ICD-10-CM | POA: Diagnosis not present

## 2019-06-05 ENCOUNTER — Ambulatory Visit (HOSPITAL_COMMUNITY)
Admission: RE | Admit: 2019-06-05 | Discharge: 2019-06-05 | Disposition: A | Discharge status: Home / Self Care | Payer: Medicare Other | Source: Ambulatory Visit | Attending: Surgery | Admitting: Surgery

## 2019-06-05 ENCOUNTER — Encounter: Payer: Self-pay | Admitting: Surgery

## 2019-06-05 ENCOUNTER — Other Ambulatory Visit: Payer: Self-pay | Admitting: Family

## 2019-06-05 ENCOUNTER — Ambulatory Visit (INDEPENDENT_AMBULATORY_CARE_PROVIDER_SITE_OTHER): Payer: Medicare Other | Admitting: Surgery

## 2019-06-05 ENCOUNTER — Other Ambulatory Visit: Payer: Self-pay

## 2019-06-05 VITALS — BP 147/73 | HR 78 | Temp 98.2°F | Resp 20 | Ht 74.0 in | Wt 302.0 lb

## 2019-06-05 DIAGNOSIS — I70213 Atherosclerosis of native arteries of extremities with intermittent claudication, bilateral legs: Secondary | ICD-10-CM | POA: Diagnosis not present

## 2019-06-05 DIAGNOSIS — E1142 Type 2 diabetes mellitus with diabetic polyneuropathy: Secondary | ICD-10-CM

## 2019-06-05 NOTE — Progress Notes (Signed)
Vascular and Vein Specialist of [facility]  Patient name: [name] [name] MRN: [id number] DOB: 05-23-1950 Sex: male   REASON FOR VISIT:    Follow up  HISOTRY OF PRESENT ILLNESS:    [name] [name] is a 69 y.o. male  I had previously seen  in 2016 for leg pain, however at that time his ABIs were higher and his symptoms were more consistent with nerve damage from his back issues.  Now, he has had a issues with progressive leg pain.  This begins with minimal activity and is somewhat alleviated with rest.  His right leg bothers him more than the left.  He does not have any open wounds or rest pain.  He quit smoking 3 years ago.  On 04-17-2019 he underwent angiography and was found to have multiple 80% lesions within the mid superficial femoral artery on the right.  These were successfully treated using a 7 x 120 drug-eluting stent.  He was brought back for an attempt at revascularization of an occluded left superficial femoral artery.  Unfortunately I was unable to get through the proximal calf even with the back end of a Glidewire.  He comes in today to discuss surgical options.  The patient states he has had a dramatic improvement since fixing his right leg.  His left leg bothers him but he is able to tolerate it.  The patient has a history of coronary artery disease.  He underwent DES in 2010 in 2018.  He suffers from atrial fibrillation.  He is on Eliquis and has a loop recorder implanted.  He is statin and Repatha intolerant.  He is currently involved in a research study to address his hypercholesterolemia.  He is a diabetic.  PAST MEDICAL HISTORY:   Past Medical History:  Diagnosis Date  . Anxiety   . Arthritis   . CAD (coronary artery disease)    a. 2010: DES to CTO of RCA. EF 55% b. 07/2016: cath showing total occlusion within previously placed RCA stent (collaterals present), severe stenosis along LCx and OM1 (treated with 2 overlapping DES). c. repeat  cath in 01/2018 showing patent stents along LCx and OM with CTO of D2, CTO of distal LCx, and CTO of RCA with collaterals present overall unchanged since 2018 with medical management recom  . Cellulitis and abscess rt groin   . Depression   . Diabetes mellitus   . Disorders of iron metabolism   . GERD (gastroesophageal reflux disease)   . Hyperlipidemia   . Hypertension   . Low serum testosterone level   . Medically noncompliant   . Myocardial infarction [facility])      FAMILY HISTORY:   Family History  Problem Relation Age of Onset  . Diabetes Father   . Valvular heart disease Father   . Arthritis Father   . Heart disease Father   . Stroke Father   . Alzheimer's disease Mother   . Hyperlipidemia Mother   . Hypertension Mother   . Arthritis Mother   . Lung cancer Mother   . Stroke Mother   . Headache Mother   . Arthritis/Rheumatoid Sister   . Diabetes Sister   . Hypertension Sister   . Hyperlipidemia Sister   . Depression Sister   . Dementia Maternal Aunt   . Dementia Maternal Uncle   . Heart disease Maternal Uncle   . Stomach cancer Paternal Uncle   . Colon cancer Neg Hx   . Liver disease Neg Hx  SOCIAL HISTORY:   Social History   Tobacco Use  . Smoking status: Former Smoker    Packs/day: 0.25    Years: 51.00    Pack years: 12.75    Types: Cigarettes    Quit date: 08/14/2016    Years since quitting: 2.8  . Smokeless tobacco: Never Used  . Tobacco comment: smokes  a pack a week  Substance Use Topics  . Alcohol use: No    Alcohol/week: 0.0 standard drinks     ALLERGIES:   Allergies  Allergen Reactions  . Shellfish Allergy Anaphylaxis and Other (See Comments)    Tongue swelling, hives   . Sulfa Antibiotics Anaphylaxis and Rash    Tongue swelling, hives  . Ace Inhibitors Other (See Comments) and Cough    CKD, renal failure   . Invokana [Canagliflozin] Other (See Comments)    Syncope / dehydration  . Lisinopril Cough  . Metformin And Related  Itching  . Pravastatin Sodium Other (See Comments)    myalgias  . Milk-Related Compounds     Ties stomach in knots   . Crestor [Rosuvastatin] Other (See Comments)    Myalgias   . Fenofibrate Other (See Comments)    Body aches - pt currently taking isnt sure if its causing any pain  . Horse-Derived Products Rash  . Iodine Other (See Comments)    ????. PATIENT SAYS HE DID FINE WITH LAST CT WITH CONTRAST** patient had IV contrast on 06/21/2018 without pre meds without any issues. Patient had allergic reaction to Shellfish 30 years ago-not to IV contrast.   . Lexapro [Escitalopram Oxalate] Other (See Comments)    Buzzing in ears,headache, felt like a zombie  . Lipitor [Atorvastatin] Other (See Comments)    myalgias  . Livalo [Pitavastatin] Other (See Comments)    Myalgias   . Repatha [Evolocumab] Other (See Comments)    Myalgias, flu like sx  . Tape Rash     CURRENT MEDICATIONS:   Current Outpatient Medications  Medication Sig Dispense Refill  . apixaban (ELIQUIS) 5 MG TABS tablet TAKE 1 TABLET BY MOUTH TWICE DAILY . (Patient taking differently: Take 5 mg by mouth 2 (two) times daily. ) 60 tablet 3  . Chlorphen-Phenyleph-ASA (ALKA-SELTZER PLUS COLD PO) Take 2 tablets by mouth daily as needed (allergies).    . clopidogrel (PLAVIX) 75 MG tablet Take 1 tablet (75 mg total) by mouth daily. 30 tablet 11  . cyclobenzaprine (FLEXERIL) 10 MG tablet Take 10 mg by mouth 3 (three) times daily.    . DULoxetine (CYMBALTA) 30 MG capsule TAKE 1 CAPSULE BY MOUTH ONCE DAILY . APPOINTMENT REQUIRED FOR FUTURE REFILLS (Patient taking differently: Take 30 mg by mouth at bedtime. ) 30 capsule 2  . fenofibrate 160 MG tablet TAKE 1 TABLET BY MOUTH ONCE DAILY FOR CHOLESTEROL AND  TRIGLYCERIDE (Patient taking differently: Take 160 mg by mouth at bedtime. ) 90 tablet 1  . furosemide (LASIX) 20 MG tablet Take 1 tablet by mouth twice daily (Patient taking differently: Take 20 mg by mouth daily as needed for edema.  ) 180 tablet 1  . gabapentin (NEURONTIN) 300 MG capsule Take 2 capsules (600 mg total) by mouth 3 (three) times daily. 180 capsule 2  . HUMALOG KWIKPEN 100 UNIT/ML KwikPen INJECT 5 TO 20 UNITS SUBCUTANEOUSLY THREE TIMES DAILY WITH MEALS 15 mL 0  . Insulin Degludec (TRESIBA FLEXTOUCH) 200 UNIT/ML SOPN Inject 36 Units into the skin at bedtime. 12 pen 2  . insulin NPH-regular Human (HUMULIN 70/30) (  70-30) 100 UNIT/ML injection Inject 90 Units into the skin 2 (two) times daily with a meal. (Patient taking differently: Inject 100 Units into the skin 2 (two) times daily with a meal. ) 60 mL 12  . isosorbide mononitrate (IMDUR) 30 MG 24 hr tablet Take 1 tablet by mouth once daily (Patient taking differently: Take 30 mg by mouth daily. ) 90 tablet 1  . linaclotide (LINZESS) 72 MCG capsule Take 72 mcg by mouth daily as needed (constipation).    . Menthol, Topical Analgesic, (BLUE-EMU MAXIMUM STRENGTH EX) Apply 1 application topically daily as needed (back pain).    . metoprolol succinate (TOPROL-XL) 50 MG 24 hr tablet Take 1 tablet (50 mg total) by mouth daily. 30 tablet 11  . nitroGLYCERIN (NITROSTAT) 0.4 MG SL tablet Place 1 tablet (0.4 mg total) under the tongue every 5 (five) minutes x 3 doses as needed for chest pain. 25 tablet 3  . oxyCODONE-acetaminophen (PERCOCET) 7.5-325 MG tablet Take 1 tablet by mouth 2 (two) times daily as needed for severe pain.    Marland Kitchen OZEMPIC, 1 MG/DOSE, 2 MG/1.5ML SOPN INJECT 1ML INTO THE SKIN ONCE A WEEK (NEEDS TO BE SEEN BEFORE NEXT REFILL) 4 mL 0  . pantoprazole (PROTONIX) 40 MG tablet Take 1 tablet by mouth once daily 90 tablet 1  . telmisartan (MICARDIS) 40 MG tablet TAKE 1 TABLET BY MOUTH ONCE DAILY, REPLACING LISINOPRIL (Patient taking differently: Take 40 mg by mouth daily. ) 90 tablet 0  . triamcinolone (NASACORT ALLERGY 24HR) 55 MCG/ACT AERO nasal inhaler Place 1 spray into the nose daily.      No current facility-administered medications for this visit.    REVIEW OF  SYSTEMS:   [X]  denotes positive finding, [ ]  denotes negative finding Cardiac  Comments:  Chest pain or chest pressure:    Shortness of breath upon exertion:    Short of breath when lying flat:    Irregular heart rhythm:        Vascular    Pain in calf, thigh, or hip brought on by ambulation: x   Pain in feet at night that wakes you up from your sleep:     Blood clot in your veins:    Leg swelling:         Pulmonary    Oxygen at home:    Productive cough:     Wheezing:         Neurologic    Sudden weakness in arms or legs:     Sudden numbness in arms or legs:     Sudden onset of difficulty speaking or slurred speech:    Temporary loss of vision in one eye:     Problems with dizziness:         Gastrointestinal    Blood in stool:     Vomited blood:         Genitourinary    Burning when urinating:     Blood in urine:        Psychiatric    Major depression:         Hematologic    Bleeding problems:    Problems with blood clotting too easily:        Skin    Rashes or ulcers:        Constitutional    Fever or chills:      PHYSICAL EXAM:   Vitals:   06/05/19 0843  BP: (!) 147/73  Pulse: 78  Resp: 20  Temp: 98.2  F (36.8 C)  SpO2: 96%  Weight: (!) 302 lb (137 kg)  Height: 6\' 2"  (1.88 m)    GENERAL: The patient is a well-nourished male, in no acute distress. The vital signs are documented above. CARDIAC: There is a regular rate and rhythm. PULMONARY: Non-labored respirations ABDOMEN: Soft and non-tender with normal pitched bowel sounds.  MUSCULOSKELETAL: There are no major deformities or cyanosis. NEUROLOGIC: No focal weakness or paresthesias are detected. SKIN: There are no ulcers or rashes noted. PSYCHIATRIC: The patient has a normal affect.  STUDIES:     RT Diameter  RT Findings         GSV            LT Diameter  LT Findings      (cm)                                            (cm)                   +---------------+-----------+----------------------+---------------+-----------+      0.73       branching     Saphenofemoral         0.91       branching                                   Junction                                  +---------------+-----------+----------------------+---------------+-----------+      0.28                     Proximal thigh         0.31       branching  +---------------+-----------+----------------------+---------------+-----------+      0.21       branching       Mid thigh            0.35       branching  +---------------+-----------+----------------------+---------------+-----------+      0.22                      Distal thigh          0.34       branching  +---------------+-----------+----------------------+---------------+-----------+      0.25       branching          Knee              0.21       branching  +---------------+-----------+----------------------+---------------+-----------+      0.31                       Prox calf            0.20                  +---------------+-----------+----------------------+---------------+-----------+      0.30                        Mid calf            0.12                  +---------------+-----------+----------------------+---------------+-----------+  0.31                      Distal calf           0.15       branching  +---------------+-----------+----------------------+---------------+-----------+   MEDICAL ISSUES:   PAD with claudication: The patient has had complete resolution of the pain and numbness in his right leg.  He still has some limitations with his left leg particularly with cramping with activity however he states that his back is one of the main reasons why he stopped walking.  He does not have any open wounds or rest pain.  We discussed, the neck step would be a femoral above-knee popliteal artery bypass graft, however at this time, I do  not think his symptoms warrant putting him through the operation.  He is in agreement with that.  He continues to be abstinent from smoking.  He has stopped his aspirin but is taking Plavix.  I have him scheduled for follow-up with me to discuss options in the left leg again in 3 months at the time he comes back for duplex of his stent in the right leg.    Leia Alf, MD, FACS Vascular and Vein Specialists of [facility] [phone] Pager [phone]

## 2019-06-07 ENCOUNTER — Other Ambulatory Visit: Payer: Self-pay | Admitting: *Deleted

## 2019-06-07 DIAGNOSIS — I739 Peripheral vascular disease, unspecified: Secondary | ICD-10-CM

## 2019-06-07 DIAGNOSIS — I70213 Atherosclerosis of native arteries of extremities with intermittent claudication, bilateral legs: Secondary | ICD-10-CM

## 2019-06-13 DIAGNOSIS — M47816 Spondylosis without myelopathy or radiculopathy, lumbar region: Secondary | ICD-10-CM | POA: Diagnosis not present

## 2019-06-14 ENCOUNTER — Ambulatory Visit (INDEPENDENT_AMBULATORY_CARE_PROVIDER_SITE_OTHER): Payer: Medicare Other | Admitting: Internal Medicine

## 2019-06-14 ENCOUNTER — Encounter: Payer: Self-pay | Admitting: Internal Medicine

## 2019-06-14 ENCOUNTER — Other Ambulatory Visit: Payer: Self-pay

## 2019-06-14 VITALS — BP 138/79 | HR 120 | Temp 97.8°F | Ht 74.0 in | Wt 303.0 lb

## 2019-06-14 DIAGNOSIS — I480 Paroxysmal atrial fibrillation: Secondary | ICD-10-CM | POA: Diagnosis not present

## 2019-06-14 DIAGNOSIS — I2510 Atherosclerotic heart disease of native coronary artery without angina pectoris: Secondary | ICD-10-CM

## 2019-06-14 NOTE — Progress Notes (Signed)
HPI [name] [name] returns today for ongoing evaluation and management of PAF, CAD, s/p MI, and cryptogenic stroke, s/p ILR for which he was found to have PAF. In the interim, he has had occaisional episodes of chest pain and palpitations. He has back and leg issues due to arthritis and claudication. No syncope.  Allergies  Allergen Reactions  . Shellfish Allergy Anaphylaxis and Other (See Comments)    Tongue swelling, hives   . Sulfa Antibiotics Anaphylaxis and Rash    Tongue swelling, hives  . Ace Inhibitors Other (See Comments) and Cough    CKD, renal failure   . Invokana [Canagliflozin] Other (See Comments)    Syncope / dehydration  . Lisinopril Cough  . Metformin And Related Itching  . Pravastatin Sodium Other (See Comments)    myalgias  . Milk-Related Compounds     Ties stomach in knots   . Crestor [Rosuvastatin] Other (See Comments)    Myalgias   . Fenofibrate Other (See Comments)    Body aches - pt currently taking isnt sure if its causing any pain  . Horse-Derived Products Rash  . Iodine Other (See Comments)    ????. PATIENT SAYS HE DID FINE WITH LAST CT WITH CONTRAST** patient had IV contrast on 06/21/2018 without pre meds without any issues. Patient had allergic reaction to Shellfish 30 years ago-not to IV contrast.   . Lexapro [Escitalopram Oxalate] Other (See Comments)    Buzzing in ears,headache, felt like a zombie  . Lipitor [Atorvastatin] Other (See Comments)    myalgias  . Livalo [Pitavastatin] Other (See Comments)    Myalgias   . Repatha [Evolocumab] Other (See Comments)    Myalgias, flu like sx  . Tape Rash     Current Outpatient Medications  Medication Sig Dispense Refill  . apixaban (ELIQUIS) 5 MG TABS tablet TAKE 1 TABLET BY MOUTH TWICE DAILY . (Patient taking differently: Take 5 mg by mouth 2 (two) times daily. ) 60 tablet 3  . Chlorphen-Phenyleph-ASA (ALKA-SELTZER PLUS COLD PO) Take 2 tablets by mouth daily as needed (allergies).    .  clopidogrel (PLAVIX) 75 MG tablet Take 1 tablet (75 mg total) by mouth daily. 30 tablet 11  . cyclobenzaprine (FLEXERIL) 10 MG tablet Take 10 mg by mouth 3 (three) times daily.    . DULoxetine (CYMBALTA) 30 MG capsule TAKE 1 CAPSULE BY MOUTH ONCE DAILY . APPOINTMENT REQUIRED FOR FUTURE REFILLS (Patient taking differently: Take 30 mg by mouth at bedtime. ) 30 capsule 2  . fenofibrate 160 MG tablet TAKE 1 TABLET BY MOUTH ONCE DAILY FOR CHOLESTEROL AND  TRIGLYCERIDE (Patient taking differently: Take 160 mg by mouth at bedtime. ) 90 tablet 1  . furosemide (LASIX) 20 MG tablet Take 1 tablet by mouth twice daily (Patient taking differently: Take 20 mg by mouth daily as needed for edema. ) 180 tablet 1  . gabapentin (NEURONTIN) 300 MG capsule Take 2 capsules (600 mg total) by mouth 3 (three) times daily. 180 capsule 2  . HUMALOG KWIKPEN 100 UNIT/ML KwikPen INJECT 5 TO 20 UNITS SUBCUTANEOUSLY THREE TIMES DAILY WITH MEALS 15 mL 0  . Insulin Degludec (TRESIBA FLEXTOUCH) 200 UNIT/ML SOPN Inject 36 Units into the skin at bedtime. 12 pen 2  . insulin NPH-regular Human (HUMULIN 70/30) (70-30) 100 UNIT/ML injection Inject 90 Units into the skin 2 (two) times daily with a meal. (Patient taking differently: Inject 100 Units into the skin 2 (two) times daily with a meal. ) 60  mL 12  . isosorbide mononitrate (IMDUR) 30 MG 24 hr tablet Take 1 tablet by mouth once daily (Patient taking differently: Take 30 mg by mouth daily. ) 90 tablet 1  . linaclotide (LINZESS) 72 MCG capsule Take 72 mcg by mouth daily as needed (constipation).    . Menthol, Topical Analgesic, (BLUE-EMU MAXIMUM STRENGTH EX) Apply 1 application topically daily as needed (back pain).    . metoprolol succinate (TOPROL-XL) 50 MG 24 hr tablet Take 1 tablet (50 mg total) by mouth daily. 30 tablet 11  . nitroGLYCERIN (NITROSTAT) 0.4 MG SL tablet Place 1 tablet (0.4 mg total) under the tongue every 5 (five) minutes x 3 doses as needed for chest pain. 25 tablet 3    . oxyCODONE-acetaminophen (PERCOCET) 7.5-325 MG tablet Take 1 tablet by mouth 2 (two) times daily as needed for severe pain.    Marland Kitchen OZEMPIC, 1 MG/DOSE, 2 MG/1.5ML SOPN INJECT 1 ML  ONCE A WEEK . APPOINTMENT REQUIRED FOR FUTURE REFILLS 4 mL 0  . pantoprazole (PROTONIX) 40 MG tablet Take 1 tablet by mouth once daily 90 tablet 1  . telmisartan (MICARDIS) 40 MG tablet TAKE 1 TABLET BY MOUTH ONCE DAILY, REPLACING LISINOPRIL (Patient taking differently: Take 40 mg by mouth daily. ) 90 tablet 0  . triamcinolone (NASACORT ALLERGY 24HR) 55 MCG/ACT AERO nasal inhaler Place 1 spray into the nose daily.      No current facility-administered medications for this visit.     Past Medical History:  Diagnosis Date  . Anxiety   . Arthritis   . CAD (coronary artery disease)    a. 2010: DES to CTO of RCA. EF 55% b. 07/2016: cath showing total occlusion within previously placed RCA stent (collaterals present), severe stenosis along LCx and OM1 (treated with 2 overlapping DES). c. repeat cath in 01/2018 showing patent stents along LCx and OM with CTO of D2, CTO of distal LCx, and CTO of RCA with collaterals present overall unchanged since 2018 with medical management recom  . Cellulitis and abscess rt groin   . Depression   . Diabetes mellitus   . Disorders of iron metabolism   . GERD (gastroesophageal reflux disease)   . Hyperlipidemia   . Hypertension   . Low serum testosterone level   . Medically noncompliant   . Myocardial infarction ([place])     ROS:   All systems reviewed and negative except as noted in the HPI.   Past Surgical History:  Procedure Laterality Date  . ABDOMINAL AORTOGRAM W/LOWER EXTREMITY N/A 03/21/2019   Procedure: ABDOMINAL AORTOGRAM W/LOWER EXTREMITY;  Surgeon: [name], MD;  Location: [employer] [facility];  Service: Cardiovascular;  Laterality: N/A;  . BACK SURGERY  2015   ACDF by Dr. [name]  . COLONOSCOPY N/A 10/01/2014   Dr. [name]: multiple tubular adenomas removed, colonic  diverticulosis, redundant colon. next tcs advised for 09/2017. PATIENT NEEDS PROPOFOL FOR FAILED CONSCIOUS SEDATION  . CORONARY STENT INTERVENTION N/A 07/30/2016   Procedure: Coronary Stent Intervention;  Surgeon: [name], MD;  Location: [place] CV [facility];  Service: Cardiovascular;  Laterality: N/A;  . CORONARY STENT PLACEMENT  2000   By Dr. Olevia Perches  . EP IMPLANTABLE DEVICE N/A 05/25/2016   Procedure: Loop Recorder Insertion;  Surgeon: [name], MD;  Location: [place] CV [facility];  Service: Cardiovascular;  Laterality: N/A;  . ESOPHAGOGASTRODUODENOSCOPY     esophagus [facility] at [facility]  . ESOPHAGOGASTRODUODENOSCOPY N/A 10/01/2014   Dr. [name]: patchy mottling/erythema and minimal  polypoid appearance of gastric mucosa. bx with mild inlammation but no H.pylori  . HERNIA REPAIR  [id number]   umbilical  . LEFT HEART CATH AND CORONARY ANGIOGRAPHY N/A 07/30/2016   Procedure: Left Heart Cath and Coronary Angiography;  Surgeon: [name], MD;  Location: [place] CV LAB;  Service: Cardiovascular;  Laterality: N/A;  . LEFT HEART CATH AND CORONARY ANGIOGRAPHY N/A 01/19/2018   Procedure: LEFT HEART CATH AND CORONARY ANGIOGRAPHY;  Surgeon: [name], MD;  Location: [place] CV [facility];  Service: Cardiovascular;  Laterality: N/A;  . LESION REMOVAL     Lip and hand   . LOWER EXTREMITY ANGIOGRAPHY N/A 04/18/2019   Procedure: LOWER EXTREMITY ANGIOGRAPHY;  Surgeon: [name], MD;  Location: [place] CV LAB;  Service: Cardiovascular;  Laterality: N/A;  . NECK SURGERY    . PERIPHERAL VASCULAR BALLOON ANGIOPLASTY  04/18/2019   Procedure: PERIPHERAL VASCULAR BALLOON ANGIOPLASTY;  Surgeon: [name], MD;  Location: [place] CV [facility];  Service: Cardiovascular;;  . PERIPHERAL VASCULAR INTERVENTION Right 03/21/2019   Procedure: PERIPHERAL VASCULAR INTERVENTION;  Surgeon: [name], MD;  Location: [place] CV [facility];  Service: Cardiovascular;  Laterality: Right;  superficial  femoral     Family History  Problem Relation Age of Onset  . Diabetes Father   . Valvular heart disease Father   . Arthritis Father   . Heart disease Father   . Stroke Father   . Alzheimer's disease Mother   . Hyperlipidemia Mother   . Hypertension Mother   . Arthritis Mother   . Lung cancer Mother   . Stroke Mother   . Headache Mother   . Arthritis/Rheumatoid Sister   . Diabetes Sister   . Hypertension Sister   . Hyperlipidemia Sister   . Depression Sister   . Dementia Maternal Aunt   . Dementia Maternal Uncle   . Heart disease Maternal Uncle   . Stomach cancer Paternal Uncle   . Colon cancer Neg Hx   . Liver disease Neg Hx      Social History   Socioeconomic History  . Marital status: Married    Spouse name: Not on file  . Number of children: 2  . Years of education: 19  . Highest education level: 12th grade  Occupational History  . Occupation: retired  Tobacco Use  . Smoking status: Former Smoker    Packs/day: 0.25    Years: 51.00    Pack years: 12.75    Types: Cigarettes    Quit date: 08/14/2016    Years since quitting: 2.8  . Smokeless tobacco: Never Used  . Tobacco comment: smokes  a pack a week  Substance and Sexual Activity  . Alcohol use: No    Alcohol/week: 0.0 standard drinks  . Drug use: No  . Sexual activity: Yes  Other Topics Concern  . Not on file  Social History Narrative   Lives with his wife.  Retired.  Unable to afford expensive medicines.     Social Determinants of Health   Financial Resource Strain:   . Difficulty of Paying Living Expenses: Not on file  Food Insecurity:   . Worried About Charity fundraiser in the Last Year: Not on file  . Ran Out of Food in the Last Year: Not on file  Transportation Needs:   . Lack of Transportation (Medical): Not on file  . Lack of Transportation (Non-Medical): Not on file  Physical Activity:   . Days of Exercise per  Week: Not on file  . Minutes of Exercise per Session: Not on file    Stress:   . Feeling of Stress : Not on file  Social Connections:   . Frequency of Communication with Friends and Family: Not on file  . Frequency of Social Gatherings with Friends and Family: Not on file  . Attends Religious Services: Not on file  . Active Member of Clubs or Organizations: Not on file  . Attends Archivist Meetings: Not on file  . Marital Status: Not on file  Intimate Partner Violence:   . Fear of Current or Ex-Partner: Not on file  . Emotionally Abused: Not on file  . Physically Abused: Not on file  . Sexually Abused: Not on file     BP 138/79   Pulse (!) 120   Temp 97.8 F (36.6 C)   Ht 6\' 2"  (1.88 m)   Wt (!) 303 lb (137.4 kg)   SpO2 98%   BMI 38.90 kg/m   Physical Exam:  Morbidly obese appearing NAD HEENT: Unremarkable Neck:  No JVD, no thyromegally Lymphatics:  No adenopathy Back:  No CVA tenderness Lungs:  Clear with no wheezes HEART:  Regular rate rhythm, no murmurs, no rubs, no clicks Abd:  soft, positive bowel sounds, no organomegally, no rebound, no guarding Ext:  2 plus pulses, no edema, no cyanosis, no clubbing Skin:  No rashes no nodules Neuro:  CN II through XII intact, motor grossly intact  DEVICE  Normal device function.  See PaceArt for details.   Assess/Plan: 1. PAF - he is maintaining NSR 98% of the time. No change 2. CAD - he denies anginal symptoms. He will continue his current meds. 3. Peripheral vascular disease - he has claudication in his left leg. He is pending a visit with Dr. [name]. 4. Obesity - he is encouraged to lose weight.   [name] [name].D.

## 2019-06-14 NOTE — Patient Instructions (Signed)
Medication Instructions:  Your physician recommends that you continue on your current medications as directed. Please refer to the Current Medication list given to you today.  *If you need a refill on your cardiac medications before your next appointment, please call your pharmacy*  Lab Work: NONE  If you have labs (blood work) drawn today and your tests are completely normal, you will receive your results only by: . MyChart Message (if you have MyChart) OR . A paper copy in the mail If you have any lab test that is abnormal or we need to change your treatment, we will call you to review the results.  Testing/Procedures: NONE   Follow-Up: At [facility], you and your health needs are our priority.  As part of our continuing mission to provide you with exceptional heart care, we have created designated Provider Care Teams.  These Care Teams include your primary Cardiologist (physician) and Advanced Practice Providers (APPs -  Physician Assistants and Nurse Practitioners) who all work together to provide you with the care you need, when you need it.  Your next appointment:   1 year(s)  The format for your next appointment:   In Person  Provider:   Gregg Taylor, MD  Other Instructions Thank you for choosing [facility]!    

## 2019-06-15 ENCOUNTER — Ambulatory Visit (INDEPENDENT_AMBULATORY_CARE_PROVIDER_SITE_OTHER): Payer: Medicare Other | Admitting: *Deleted

## 2019-06-15 DIAGNOSIS — I6381 Other cerebral infarction due to occlusion or stenosis of small artery: Secondary | ICD-10-CM | POA: Diagnosis not present

## 2019-06-15 LAB — CUP PACEART REMOTE DEVICE CHECK
Date Time Interrogation Session: 20210128024055
Implantable Pulse Generator Implant Date: 20180108

## 2019-06-16 NOTE — Progress Notes (Signed)
ILR Remote 

## 2019-06-19 ENCOUNTER — Encounter: Payer: Self-pay | Admitting: Family

## 2019-06-19 ENCOUNTER — Ambulatory Visit (INDEPENDENT_AMBULATORY_CARE_PROVIDER_SITE_OTHER): Payer: Medicare Other | Admitting: Family

## 2019-06-19 DIAGNOSIS — J019 Acute sinusitis, unspecified: Secondary | ICD-10-CM

## 2019-06-19 MED ORDER — AMOXICILLIN-POT CLAVULANATE 875-125 MG PO TABS: 1.0000 | ORAL_TABLET | Freq: Two times a day (BID) | ORAL | 0 refills | Status: DC

## 2019-06-19 NOTE — Progress Notes (Signed)
   Virtual Visit via telephone Note Due to COVID-19 pandemic this visit was conducted virtually. This visit type was conducted due to national recommendations for restrictions regarding the COVID-19 Pandemic (e.g. social distancing, sheltering in place) in an effort to limit this patient's exposure and mitigate transmission in our community. All issues noted in this document were discussed and addressed.  A physical exam was not performed with this format.  I connected with [name] [name] on 06/19/19 at 10:10 AM  by telephone and verified that I am speaking with the correct person using two identifiers. [name] [name] is currently located at car and wife is currently with him  during visit. The provider, [name], FNP is located in their office at time of visit.  I discussed the limitations, risks, security and privacy concerns of performing an evaluation and management service by telephone and the availability of in person appointments. I also discussed with the patient that there may be a patient responsible charge related to this service. The patient expressed understanding and agreed to proceed.   History and Present Illness:  Sinusitis This is a recurrent problem. The current episode started 1 to 4 weeks ago. The problem has been waxing and waning since onset. There has been no fever. His pain is at a severity of 7/10. The pain is moderate. Associated symptoms include congestion, ear pain, headaches, a hoarse voice, shortness of breath and sinus pressure. Pertinent negatives include no chills, coughing or sneezing. Past treatments include oral decongestants. The treatment provided mild relief.      Review of Systems  Constitutional: Negative for chills.  HENT: Positive for congestion, ear pain, hoarse voice and sinus pressure. Negative for sneezing.   Respiratory: Positive for shortness of breath. Negative for cough.   Neurological: Positive for headaches.  All other systems reviewed and  are negative.    Observations/Objective: No SOB or distress noted, hoarse voice  Assessment and Plan: [name] [name] comes in today with chief complaint of No chief complaint on file.   Diagnosis and orders addressed:  1. Acute sinusitis, recurrence not specified, unspecified location - Take meds as prescribed - Use a cool mist humidifier  -Use saline nose sprays frequently -Force fluids -For any cough or congestion  Use plain Mucinex- regular strength or max strength is fine -For fever or aces or pains- take tylenol or ibuprofen. -Throat lozenges if help -Call if symptoms worsen or do not improve  - amoxicillin-clavulanate (AUGMENTIN) 875-125 MG tablet; Take 1 tablet by mouth 2 (two) times daily.  Dispense: 14 tablet; Refill: 0     I discussed the assessment and treatment plan with the patient. The patient was provided an opportunity to ask questions and all were answered. The patient agreed with the plan and demonstrated an understanding of the instructions.   The patient was advised to call back or seek an in-person evaluation if the symptoms worsen or if the condition fails to improve as anticipated.  The above assessment and management plan was discussed with the patient. The patient verbalized understanding of and has agreed to the management plan. Patient is aware to call the clinic if symptoms persist or worsen. Patient is aware when to return to the clinic for a follow-up visit. Patient educated on when it is appropriate to go to the emergency department.   Time call ended:  10:21 AM   I provided 11 minutes of non-face-to-face time during this encounter.    [name], FNP

## 2019-06-21 ENCOUNTER — Other Ambulatory Visit: Payer: Self-pay | Admitting: Student

## 2019-06-21 ENCOUNTER — Other Ambulatory Visit: Payer: Self-pay | Admitting: Family

## 2019-06-21 DIAGNOSIS — E1169 Type 2 diabetes mellitus with other specified complication: Secondary | ICD-10-CM

## 2019-06-21 DIAGNOSIS — F331 Major depressive disorder, recurrent, moderate: Secondary | ICD-10-CM

## 2019-06-21 DIAGNOSIS — E1142 Type 2 diabetes mellitus with diabetic polyneuropathy: Secondary | ICD-10-CM

## 2019-06-21 DIAGNOSIS — E785 Hyperlipidemia, unspecified: Secondary | ICD-10-CM

## 2019-06-21 DIAGNOSIS — I480 Paroxysmal atrial fibrillation: Secondary | ICD-10-CM

## 2019-06-21 NOTE — Telephone Encounter (Signed)
Eliquis 5mg  refill request received, pt is 69yrs old, weight-137.4kg, Crea-1.57 on 04/27/2019, Diagnosis-Afib, and last seen by Dr. Lovena Le 06/14/19. Dose is appropriate based on dosing criteria. Will send in refill to requested pharmacy.

## 2019-06-21 NOTE — Telephone Encounter (Signed)
Appt 07/26/19

## 2019-06-27 ENCOUNTER — Other Ambulatory Visit (HOSPITAL_COMMUNITY): Payer: Self-pay | Admitting: Nephrology

## 2019-06-27 DIAGNOSIS — N189 Chronic kidney disease, unspecified: Secondary | ICD-10-CM

## 2019-06-28 ENCOUNTER — Other Ambulatory Visit: Payer: Medicare Other

## 2019-06-28 ENCOUNTER — Other Ambulatory Visit: Payer: Self-pay

## 2019-06-28 DIAGNOSIS — Z79899 Other long term (current) drug therapy: Secondary | ICD-10-CM | POA: Diagnosis not present

## 2019-06-28 DIAGNOSIS — I5032 Chronic diastolic (congestive) heart failure: Secondary | ICD-10-CM | POA: Diagnosis not present

## 2019-06-28 DIAGNOSIS — R809 Proteinuria, unspecified: Secondary | ICD-10-CM | POA: Diagnosis not present

## 2019-06-28 DIAGNOSIS — E559 Vitamin D deficiency, unspecified: Secondary | ICD-10-CM | POA: Diagnosis not present

## 2019-06-28 DIAGNOSIS — I129 Hypertensive chronic kidney disease with stage 1 through stage 4 chronic kidney disease, or unspecified chronic kidney disease: Secondary | ICD-10-CM | POA: Diagnosis not present

## 2019-06-28 DIAGNOSIS — E1159 Type 2 diabetes mellitus with other circulatory complications: Secondary | ICD-10-CM | POA: Diagnosis not present

## 2019-06-28 DIAGNOSIS — N189 Chronic kidney disease, unspecified: Secondary | ICD-10-CM | POA: Diagnosis not present

## 2019-06-28 DIAGNOSIS — E1122 Type 2 diabetes mellitus with diabetic chronic kidney disease: Secondary | ICD-10-CM | POA: Diagnosis not present

## 2019-06-28 DIAGNOSIS — D631 Anemia in chronic kidney disease: Secondary | ICD-10-CM | POA: Diagnosis not present

## 2019-06-29 ENCOUNTER — Ambulatory Visit (HOSPITAL_COMMUNITY)
Admission: RE | Admit: 2019-06-29 | Discharge: 2019-06-29 | Disposition: A | Discharge status: Home / Self Care | Payer: Medicare Other | Source: Ambulatory Visit | Attending: Nephrology | Admitting: Nephrology

## 2019-06-29 DIAGNOSIS — N189 Chronic kidney disease, unspecified: Secondary | ICD-10-CM | POA: Insufficient documentation

## 2019-06-30 DIAGNOSIS — N189 Chronic kidney disease, unspecified: Secondary | ICD-10-CM | POA: Diagnosis not present

## 2019-06-30 DIAGNOSIS — R809 Proteinuria, unspecified: Secondary | ICD-10-CM | POA: Diagnosis not present

## 2019-06-30 DIAGNOSIS — E559 Vitamin D deficiency, unspecified: Secondary | ICD-10-CM | POA: Diagnosis not present

## 2019-06-30 DIAGNOSIS — I5032 Chronic diastolic (congestive) heart failure: Secondary | ICD-10-CM | POA: Diagnosis not present

## 2019-06-30 DIAGNOSIS — I129 Hypertensive chronic kidney disease with stage 1 through stage 4 chronic kidney disease, or unspecified chronic kidney disease: Secondary | ICD-10-CM | POA: Diagnosis not present

## 2019-07-05 ENCOUNTER — Other Ambulatory Visit: Payer: Self-pay | Admitting: Family

## 2019-07-05 DIAGNOSIS — I2510 Atherosclerotic heart disease of native coronary artery without angina pectoris: Secondary | ICD-10-CM

## 2019-07-05 DIAGNOSIS — Z9861 Coronary angioplasty status: Secondary | ICD-10-CM

## 2019-07-11 ENCOUNTER — Other Ambulatory Visit: Payer: Self-pay | Admitting: Family

## 2019-07-11 DIAGNOSIS — M47816 Spondylosis without myelopathy or radiculopathy, lumbar region: Secondary | ICD-10-CM | POA: Diagnosis not present

## 2019-07-12 ENCOUNTER — Other Ambulatory Visit: Payer: Self-pay | Admitting: Family

## 2019-07-12 DIAGNOSIS — I2510 Atherosclerotic heart disease of native coronary artery without angina pectoris: Secondary | ICD-10-CM

## 2019-07-12 DIAGNOSIS — Z9861 Coronary angioplasty status: Secondary | ICD-10-CM

## 2019-07-16 ENCOUNTER — Ambulatory Visit (INDEPENDENT_AMBULATORY_CARE_PROVIDER_SITE_OTHER): Payer: Medicare Other | Admitting: *Deleted

## 2019-07-16 DIAGNOSIS — I6381 Other cerebral infarction due to occlusion or stenosis of small artery: Secondary | ICD-10-CM | POA: Diagnosis not present

## 2019-07-16 LAB — CUP PACEART REMOTE DEVICE CHECK
Date Time Interrogation Session: 20210228024654
Implantable Pulse Generator Implant Date: 20180108

## 2019-07-16 NOTE — Progress Notes (Signed)
ILR remote 

## 2019-07-20 ENCOUNTER — Ambulatory Visit: Payer: Medicare Other | Attending: Internal Medicine

## 2019-07-20 DIAGNOSIS — Z23 Encounter for immunization: Secondary | ICD-10-CM

## 2019-07-20 NOTE — Progress Notes (Signed)
   Covid-19 Vaccination Clinic  Name:  [name] [name]    MRN: [id number] DOB: 1950-12-11  07/20/2019  [name] [name] was observed post Covid-19 immunization for 30 minutes based on pre-vaccination screening without incident. He was provided with Vaccine Information Sheet and instruction to access the V-Safe system.   [name] [name] was instructed to call 911 with any severe reactions post vaccine: Marland Kitchen Difficulty breathing  . Swelling of face and throat  . A fast heartbeat  . A bad rash all over body  . Dizziness and weakness   Immunizations Administered    Name Date Dose VIS Date Route   Moderna COVID-19 Vaccine 07/20/2019 10:07 AM 0.5 mL 04/18/2019 Intramuscular   Manufacturer: Moderna   Lot: [id number]   [id number]

## 2019-07-25 ENCOUNTER — Other Ambulatory Visit: Payer: Self-pay

## 2019-07-26 ENCOUNTER — Other Ambulatory Visit: Payer: Self-pay

## 2019-07-26 ENCOUNTER — Other Ambulatory Visit: Payer: Self-pay | Admitting: Internal Medicine

## 2019-07-26 ENCOUNTER — Other Ambulatory Visit: Payer: Self-pay | Admitting: Family

## 2019-07-26 ENCOUNTER — Encounter: Payer: Self-pay | Admitting: Family

## 2019-07-26 ENCOUNTER — Ambulatory Visit (INDEPENDENT_AMBULATORY_CARE_PROVIDER_SITE_OTHER): Payer: Medicare Other | Admitting: Family

## 2019-07-26 VITALS — BP 125/66 | HR 91 | Temp 96.6°F | Ht 74.0 in | Wt 297.8 lb

## 2019-07-26 DIAGNOSIS — I5030 Unspecified diastolic (congestive) heart failure: Secondary | ICD-10-CM

## 2019-07-26 DIAGNOSIS — F331 Major depressive disorder, recurrent, moderate: Secondary | ICD-10-CM

## 2019-07-26 DIAGNOSIS — Z794 Long term (current) use of insulin: Secondary | ICD-10-CM | POA: Diagnosis not present

## 2019-07-26 DIAGNOSIS — E785 Hyperlipidemia, unspecified: Secondary | ICD-10-CM

## 2019-07-26 DIAGNOSIS — I2510 Atherosclerotic heart disease of native coronary artery without angina pectoris: Secondary | ICD-10-CM | POA: Diagnosis not present

## 2019-07-26 DIAGNOSIS — Z9861 Coronary angioplasty status: Secondary | ICD-10-CM | POA: Diagnosis not present

## 2019-07-26 DIAGNOSIS — I480 Paroxysmal atrial fibrillation: Secondary | ICD-10-CM

## 2019-07-26 DIAGNOSIS — K219 Gastro-esophageal reflux disease without esophagitis: Secondary | ICD-10-CM

## 2019-07-26 DIAGNOSIS — I10 Essential (primary) hypertension: Secondary | ICD-10-CM | POA: Diagnosis not present

## 2019-07-26 DIAGNOSIS — E1142 Type 2 diabetes mellitus with diabetic polyneuropathy: Secondary | ICD-10-CM

## 2019-07-26 DIAGNOSIS — K5900 Constipation, unspecified: Secondary | ICD-10-CM

## 2019-07-26 DIAGNOSIS — E1159 Type 2 diabetes mellitus with other circulatory complications: Secondary | ICD-10-CM

## 2019-07-26 DIAGNOSIS — I252 Old myocardial infarction: Secondary | ICD-10-CM

## 2019-07-26 DIAGNOSIS — F419 Anxiety disorder, unspecified: Secondary | ICD-10-CM

## 2019-07-26 DIAGNOSIS — E1169 Type 2 diabetes mellitus with other specified complication: Secondary | ICD-10-CM

## 2019-07-26 DIAGNOSIS — E0841 Diabetes mellitus due to underlying condition with diabetic mononeuropathy: Secondary | ICD-10-CM

## 2019-07-26 LAB — BAYER DCA HB A1C WAIVED: HB A1C (BAYER DCA - WAIVED): 6.9 % (ref <7.0)

## 2019-07-26 MED ORDER — OZEMPIC (1 MG/DOSE) 2 MG/1.5ML ~~LOC~~ SOPN: 1.0000 mL | PEN_INJECTOR | SUBCUTANEOUS | 4 refills | Status: DC

## 2019-07-26 MED ORDER — FUROSEMIDE 20 MG PO TABS: 20.0000 mg | ORAL_TABLET | Freq: Every day | ORAL | 2 refills | Status: DC | PRN

## 2019-07-26 MED ORDER — DULOXETINE HCL 30 MG PO CPEP: 30.0000 mg | ORAL_CAPSULE | Freq: Every day | ORAL | 4 refills | Status: DC

## 2019-07-26 MED ORDER — CYCLOBENZAPRINE HCL 10 MG PO TABS: 10.0000 mg | ORAL_TABLET | Freq: Three times a day (TID) | ORAL | 3 refills | Status: DC

## 2019-07-26 MED ORDER — TRESIBA FLEXTOUCH 200 UNIT/ML ~~LOC~~ SOPN: 36.0000 [IU] | PEN_INJECTOR | Freq: Every day | SUBCUTANEOUS | 2 refills | Status: DC

## 2019-07-26 MED ORDER — GABAPENTIN 300 MG PO CAPS: 600.0000 mg | ORAL_CAPSULE | Freq: Three times a day (TID) | ORAL | 2 refills | Status: DC

## 2019-07-26 MED ORDER — METOPROLOL SUCCINATE ER 50 MG PO TB24: 50.0000 mg | ORAL_TABLET | Freq: Every day | ORAL | 11 refills | Status: DC

## 2019-07-26 MED ORDER — PANTOPRAZOLE SODIUM 40 MG PO TBEC: 40.0000 mg | DELAYED_RELEASE_TABLET | Freq: Every day | ORAL | 1 refills | Status: DC

## 2019-07-26 MED ORDER — FENOFIBRATE 160 MG PO TABS: ORAL_TABLET | ORAL | 4 refills | Status: DC

## 2019-07-26 MED ORDER — TELMISARTAN 40 MG PO TABS: 40.0000 mg | ORAL_TABLET | Freq: Every day | ORAL | 4 refills | Status: DC

## 2019-07-26 MED ORDER — ISOSORBIDE MONONITRATE ER 30 MG PO TB24: 30.0000 mg | ORAL_TABLET | Freq: Every day | ORAL | 4 refills | Status: DC

## 2019-07-26 MED ORDER — APIXABAN 5 MG PO TABS: 5.0000 mg | ORAL_TABLET | Freq: Two times a day (BID) | ORAL | 4 refills | Status: DC

## 2019-07-26 MED ORDER — LINACLOTIDE 72 MCG PO CAPS: 72.0000 ug | ORAL_CAPSULE | Freq: Every day | ORAL | 3 refills | Status: DC | PRN

## 2019-07-26 NOTE — Progress Notes (Signed)
Subjective:    Patient ID: [name] [name], male    DOB: 10/17/1950, 69 y.o.   MRN: [id number]  Chief Complaint  Patient presents with  . Medical Management of Chronic Issues   PT presents to the office today for chronic follow up. PT had NSTEMI on 07/30/16 and stent placed. Pt is followed by Cardiologistsevery 4 months. Pt currently in a clinical trial for hyperlipidemia.He is followed by Ortho for osteoarthritis and is currently getting injections in bilateral knees.Pt is followed by Pain Management every 2-3 months.   He is followed by Vascular and had stent placed in his right leg.  Diabetes He presents for his follow-up diabetic visit. He has type 2 diabetes mellitus. His disease course has been stable. Hypoglycemia symptoms include nervousness/anxiousness. Associated symptoms include foot paresthesias. Pertinent negatives for diabetes include no blurred vision. There are no hypoglycemic complications. Symptoms are stable. Diabetic complications include heart disease, nephropathy and peripheral neuropathy. Pertinent negatives for diabetic complications include no CVA. Risk factors for coronary artery disease include dyslipidemia, diabetes mellitus, male sex, hypertension, sedentary lifestyle and post-menopausal. He is following a generally healthy diet. His overall blood glucose range is 110-130 mg/dl. Eye exam is current.  Hypertension This is a chronic problem. The current episode started more than 1 year ago. The problem has been resolved since onset. The problem is controlled. Associated symptoms include anxiety and malaise/fatigue. Pertinent negatives include no blurred vision, peripheral edema or shortness of breath. Risk factors for coronary artery disease include dyslipidemia, male gender, obesity and sedentary lifestyle. The current treatment provides moderate improvement. Hypertensive end-organ damage includes CAD/MI. There is no history of CVA or heart failure.  Hyperlipidemia This  is a chronic problem. The current episode started more than 1 year ago. The problem is controlled. Recent lipid tests were reviewed and are normal. Exacerbating diseases include obesity. Pertinent negatives include no shortness of breath. Current antihyperlipidemic treatment includes statins. The current treatment provides moderate improvement of lipids. Risk factors for coronary artery disease include dyslipidemia, diabetes mellitus, male sex and hypertension.  Gastroesophageal Reflux He complains of belching and heartburn. This is a chronic problem. The current episode started more than 1 year ago. The problem occurs occasionally. The problem has been waxing and waning. He has tried a PPI for the symptoms. The treatment provided moderate relief.  Arthritis Presents for follow-up visit. He complains of pain and stiffness. The symptoms have been stable. Affected locations include the right knee and left knee. His pain is at a severity of 7/10.  Anxiety Presents for follow-up visit. Symptoms include depressed mood, excessive worry, nervous/anxious behavior, panic and restlessness. Patient reports no shortness of breath. Symptoms occur occasionally. The severity of symptoms is moderate.    Depression        This is a chronic problem.  The current episode started more than 1 year ago.   The onset quality is gradual.   The problem occurs intermittently.  The problem has been waxing and waning since onset.  Associated symptoms include irritable and restlessness.  Associated symptoms include no helplessness and no hopelessness.  Past medical history includes anxiety.       Review of Systems  Constitutional: Positive for malaise/fatigue.  Eyes: Negative for blurred vision.  Respiratory: Negative for shortness of breath.   Gastrointestinal: Positive for heartburn.  Musculoskeletal: Positive for arthritis and stiffness.  Psychiatric/Behavioral: Positive for depression. The patient is nervous/anxious.     All other systems reviewed and are negative.  Objective:   Physical Exam Vitals reviewed.  Constitutional:      General: He is irritable. He is not in acute distress.    Appearance: He is well-developed. He is obese.  HENT:     Head: Normocephalic.     Right Ear: Tympanic membrane normal.     Left Ear: Tympanic membrane normal.  Eyes:     General:        Right eye: No discharge.        Left eye: No discharge.     Pupils: Pupils are equal, round, and reactive to light.  Neck:     Thyroid: No thyromegaly.  Cardiovascular:     Rate and Rhythm: Normal rate and regular rhythm.     Heart sounds: Normal heart sounds. No murmur.  Pulmonary:     Effort: Pulmonary effort is normal. No respiratory distress.     Breath sounds: Normal breath sounds. No wheezing.  Abdominal:     General: Bowel sounds are normal. There is no distension.     Palpations: Abdomen is soft.     Tenderness: There is no abdominal tenderness.  Musculoskeletal:        General: No tenderness. Normal range of motion.     Cervical back: Normal range of motion and neck supple.  Skin:    General: Skin is warm and dry.     Findings: No erythema or rash.  Neurological:     Mental Status: He is alert and oriented to person, place, and time.     Cranial Nerves: No cranial nerve deficit.     Deep Tendon Reflexes: Reflexes are normal and symmetric.  Psychiatric:        Behavior: Behavior normal.        Thought Content: Thought content normal.        Judgment: Judgment normal.       BP 125/66   Pulse 91   Temp (!) 96.6 F (35.9 C) (Temporal)   Ht '6\' 2"'$  (1.88 m)   Wt 297 lb 12.8 oz (135.1 kg)   SpO2 99%   BMI 38.24 kg/m      Assessment & Plan:  [name] [name] comes in today with chief complaint of Medical Management of Chronic Issues   Diagnosis and orders addressed:  1. Paroxysmal atrial fibrillation (HCC) - CMP14+EGFR - CBC with Differential/Platelet  2. CAD S/P percutaneous coronary  angioplasty - CMP14+EGFR - CBC with Differential/Platelet  3. Diastolic congestive heart failure, unspecified HF chronicity (HCC) - CMP14+EGFR - CBC with Differential/Platelet  4. Hypertension associated with diabetes ([place]) - CMP14+EGFR - CBC with Differential/Platelet  5. Gastroesophageal reflux disease without esophagitis - CMP14+EGFR - CBC with Differential/Platelet  6. Type 2 diabetes mellitus with diabetic polyneuropathy, with long-term current use of insulin (HCC) - CMP14+EGFR - CBC with Differential/Platelet - Bayer DCA Hb A1c Waived  7. Diabetic mononeuropathy associated with diabetes mellitus due to underlying condition ([place]) - CMP14+EGFR - CBC with Differential/Platelet  8. Diabetic peripheral neuropathy associated with type 2 diabetes mellitus (HCC) - CMP14+EGFR - CBC with Differential/Platelet  9. Hyperlipidemia associated with type 2 diabetes mellitus (HCC) - CMP14+EGFR - CBC with Differential/Platelet  10. Anxiety - CMP14+EGFR - CBC with Differential/Platelet  11. Constipation, unspecified constipation type - CMP14+EGFR - CBC with Differential/Platelet  12. History of non-ST elevation myocardial infarction (NSTEMI) - CMP14+EGFR - CBC with Differential/Platelet  13. Morbid obesity ([place]) - CMP14+EGFR - CBC with Differential/Platelet  14. Moderate episode of recurrent major depressive disorder (HCC) -  CMP14+EGFR - CBC with Differential/Platelet   Labs pending Health Maintenance reviewed Diet and exercise encouraged  Follow up plan: 4 months   [name], FNP

## 2019-07-26 NOTE — Telephone Encounter (Signed)
Eliquis 5mg  refill request received, pt is 69yrs old, weight-137.4kg, Crea-1.57 on 04/27/2019, Diagnosis-Afib, and last seen by Lovena Le on 06/14/2019. Dose is appropriate based on dosing criteria. Will send in refill to requested pharmacy.

## 2019-07-26 NOTE — Patient Instructions (Signed)
Health Maintenance After Age 69 After age 69, you are at a higher risk for certain long-term diseases and infections as well as injuries from falls. Falls are a major cause of broken bones and head injuries in people who are older than age 69. Getting regular preventive care can help to keep you healthy and well. Preventive care includes getting regular testing and making lifestyle changes as recommended by your health care provider. Talk with your health care provider about:  Which screenings and tests you should have. A screening is a test that checks for a disease when you have no symptoms.  A diet and exercise plan that is right for you. What should I know about screenings and tests to prevent falls? Screening and testing are the best ways to find a health problem early. Early diagnosis and treatment give you the best chance of managing medical conditions that are common after age 69. Certain conditions and lifestyle choices may make you more likely to have a fall. Your health care provider may recommend:  Regular vision checks. Poor vision and conditions such as cataracts can make you more likely to have a fall. If you wear glasses, make sure to get your prescription updated if your vision changes.  Medicine review. Work with your health care provider to regularly review all of the medicines you are taking, including over-the-counter medicines. Ask your health care provider about any side effects that may make you more likely to have a fall. Tell your health care provider if any medicines that you take make you feel dizzy or sleepy.  Osteoporosis screening. Osteoporosis is a condition that causes the bones to get weaker. This can make the bones weak and cause them to break more easily.  Blood pressure screening. Blood pressure changes and medicines to control blood pressure can make you feel dizzy.  Strength and balance checks. Your health care provider may recommend certain tests to check your  strength and balance while standing, walking, or changing positions.  Foot health exam. Foot pain and numbness, as well as not wearing proper footwear, can make you more likely to have a fall.  Depression screening. You may be more likely to have a fall if you have a fear of falling, feel emotionally low, or feel unable to do activities that you used to do.  Alcohol use screening. Using too much alcohol can affect your balance and may make you more likely to have a fall. What actions can I take to lower my risk of falls? General instructions  Talk with your health care provider about your risks for falling. Tell your health care provider if: ? You fall. Be sure to tell your health care provider about all falls, even ones that seem minor. ? You feel dizzy, sleepy, or off-balance.  Take over-the-counter and prescription medicines only as told by your health care provider. These include any supplements.  Eat a healthy diet and maintain a healthy weight. A healthy diet includes low-fat dairy products, low-fat (lean) meats, and fiber from whole grains, beans, and lots of fruits and vegetables. Home safety  Remove any tripping hazards, such as rugs, cords, and clutter.  Install safety equipment such as grab bars in bathrooms and safety rails on stairs.  Keep rooms and walkways well-lit. Activity   Follow a regular exercise program to stay fit. This will help you maintain your balance. Ask your health care provider what types of exercise are appropriate for you.  If you need a cane or   walker, use it as recommended by your health care provider.  Wear supportive shoes that have nonskid soles. Lifestyle  Do not drink alcohol if your health care provider tells you not to drink.  If you drink alcohol, limit how much you have: ? 0-1 drink a day for women. ? 0-2 drinks a day for men.  Be aware of how much alcohol is in your drink. In the [place] one drink equals one typical bottle of beer (12  oz), one-half glass of wine (5 oz), or one shot of hard liquor (1 oz).  Do not use any products that contain nicotine or tobacco, such as cigarettes and e-cigarettes. If you need help quitting, ask your health care provider. Summary  Having a healthy lifestyle and getting preventive care can help to protect your health and wellness after age 69.  Screening and testing are the best way to find a health problem early and help you avoid having a fall. Early diagnosis and treatment give you the best chance for managing medical conditions that are more common for people who are older than age 69.  Falls are a major cause of broken bones and head injuries in people who are older than age 69. Take precautions to prevent a fall at home.  Work with your health care provider to learn what changes you can make to improve your health and wellness and to prevent falls. This information is not intended to replace advice given to you by your health care provider. Make sure you discuss any questions you have with your health care provider. Document Revised: 08/25/2018 Document Reviewed: 03/17/2017 Elsevier Patient Education  2020 [employer]  

## 2019-07-27 LAB — CBC WITH DIFFERENTIAL/PLATELET
Basophils Absolute: 0.1 10*3/uL (ref 0.0–0.2)
Basos: 1 %
EOS (ABSOLUTE): 0.2 10*3/uL (ref 0.0–0.4)
Eos: 3 %
Hematocrit: 43.4 % (ref 37.5–51.0)
Hemoglobin: 14.4 g/dL (ref 13.0–17.7)
Immature Grans (Abs): 0.1 10*3/uL (ref 0.0–0.1)
Immature Granulocytes: 1 %
Lymphocytes Absolute: 2.1 10*3/uL (ref 0.7–3.1)
Lymphs: 22 %
MCH: 27.6 pg (ref 26.6–33.0)
MCHC: 33.2 g/dL (ref 31.5–35.7)
MCV: 83 fL (ref 79–97)
Monocytes Absolute: 0.6 10*3/uL (ref 0.1–0.9)
Monocytes: 6 %
Neutrophils Absolute: 6.5 10*3/uL (ref 1.4–7.0)
Neutrophils: 67 %
Platelets: 353 10*3/uL (ref 150–450)
RBC: 5.21 x10E6/uL (ref 4.14–5.80)
RDW: 12.8 % (ref 11.6–15.4)
WBC: 9.5 10*3/uL (ref 3.4–10.8)

## 2019-07-27 LAB — CMP14+EGFR
ALT: 21 IU/L (ref 0–44)
AST: 29 IU/L (ref 0–40)
Albumin/Globulin Ratio: 1.8 (ref 1.2–2.2)
Albumin: 4.6 g/dL (ref 3.8–4.8)
Alkaline Phosphatase: 73 IU/L (ref 39–117)
BUN/Creatinine Ratio: 18 (ref 10–24)
BUN: 26 mg/dL (ref 8–27)
Bilirubin Total: 0.6 mg/dL (ref 0.0–1.2)
CO2: 25 mmol/L (ref 20–29)
Calcium: 10.3 mg/dL — ABNORMAL HIGH (ref 8.6–10.2)
Chloride: 95 mmol/L — ABNORMAL LOW (ref 96–106)
Creatinine, Ser: 1.44 mg/dL — ABNORMAL HIGH (ref 0.76–1.27)
GFR calc Af Amer: 57 mL/min/{1.73_m2} — ABNORMAL LOW (ref >59)
GFR calc non Af Amer: 50 mL/min/{1.73_m2} — ABNORMAL LOW (ref >59)
Globulin, Total: 2.6 g/dL (ref 1.5–4.5)
Glucose: 151 mg/dL — ABNORMAL HIGH (ref 65–99)
Potassium: 4.4 mmol/L (ref 3.5–5.2)
Sodium: 137 mmol/L (ref 134–144)
Total Protein: 7.2 g/dL (ref 6.0–8.5)

## 2019-08-09 DIAGNOSIS — M25561 Pain in right knee: Secondary | ICD-10-CM | POA: Diagnosis not present

## 2019-08-09 DIAGNOSIS — M170 Bilateral primary osteoarthritis of knee: Secondary | ICD-10-CM | POA: Diagnosis not present

## 2019-08-09 DIAGNOSIS — M545 Low back pain: Secondary | ICD-10-CM | POA: Diagnosis not present

## 2019-08-09 DIAGNOSIS — M47816 Spondylosis without myelopathy or radiculopathy, lumbar region: Secondary | ICD-10-CM | POA: Diagnosis not present

## 2019-08-09 DIAGNOSIS — Z5181 Encounter for therapeutic drug level monitoring: Secondary | ICD-10-CM | POA: Diagnosis not present

## 2019-08-15 NOTE — Progress Notes (Signed)
Cardiology Office Note    Date:  08/18/2019   ID:  [name] [name], DOB 08-10-1950, MRN [id number]  PCP:  [name], FNP  Cardiologist: Carlyle Dolly, MD --> wishes to switch back to Dr. [name] (last evaluated in 2010)   No chief complaint on file.   History of Present Illness:    [name] [name] is a 69 y.o. male with past medical history of CAD (s/p DES to RCA in 2010, cath in 07/2016 showing CTO of RCA with collaterals present and DES x2 to LCx and OM1), PAF (on Eliquis), HTN, HLD, IDDM, and prior CVA who presents to the office today for F/U I have not seen since 2010 Followed by [facility]/ PA / Domenic Polite   Exertional dyspnea from weight gain   He remains on Plavix and Eliquis, and denies missing any recent doses. He denies any evidence of melena, hematochezia, or hematuria.  Does experience easy bruising since being started on Eliquis. PAF discovered with ILR  Seen by [facility] 06/14/19 noted NSR 98% of time   Sees [name] for claudication. 11/30//320 had stenting right SFA. Had total occlusion of left SFA and unable to revascularize Dr [name] felt symptoms not severe enough to proceed with left fem-pop and back pain limits activity as much  No angina RLE improved and no ulcers or rest pain on left  Has changed his pain meds for back and has occasional episodes of tightness in chest and dyspnea. He thinks it's related to the oxycontin    Past Medical History:  Diagnosis Date  . Anxiety   . Arthritis   . CAD (coronary artery disease)    a. 2010: DES to CTO of RCA. EF 55% b. 07/2016: cath showing total occlusion within previously placed RCA stent (collaterals present), severe stenosis along LCx and OM1 (treated with 2 overlapping DES). c. repeat cath in 01/2018 showing patent stents along LCx and OM with CTO of D2, CTO of distal LCx, and CTO of RCA with collaterals present overall unchanged since 2018 with medical management recom  . Cellulitis and abscess rt groin   .  Depression   . Diabetes mellitus   . Disorders of iron metabolism   . GERD (gastroesophageal reflux disease)   . Hyperlipidemia   . Hypertension   . Low serum testosterone level   . Medically noncompliant   . Myocardial infarction [facility])     Past Surgical History:  Procedure Laterality Date  . ABDOMINAL AORTOGRAM W/LOWER EXTREMITY N/A 03/21/2019   Procedure: ABDOMINAL AORTOGRAM W/LOWER EXTREMITY;  Surgeon: [name], MD;  Location: [place] [facility];  Service: Cardiovascular;  Laterality: N/A;  . BACK SURGERY  2015   ACDF by Dr. [name]  . COLONOSCOPY N/A 10/01/2014   Dr. [name]: multiple tubular adenomas removed, colonic diverticulosis, redundant colon. next tcs advised for 09/2017. PATIENT NEEDS PROPOFOL FOR FAILED CONSCIOUS SEDATION  . CORONARY STENT INTERVENTION N/A 07/30/2016   Procedure: Coronary Stent Intervention;  Surgeon: [name], MD;  Location: [place] [facility];  Service: Cardiovascular;  Laterality: N/A;  . CORONARY STENT PLACEMENT  2000   By Dr. Olevia Perches  . EP IMPLANTABLE DEVICE N/A 05/25/2016   Procedure: Loop Recorder Insertion;  Surgeon: [name], MD;  Location: [place] [facility];  Service: Cardiovascular;  Laterality: N/A;  . ESOPHAGOGASTRODUODENOSCOPY     esophagus stretched remotely at [facility]  . ESOPHAGOGASTRODUODENOSCOPY N/A 10/01/2014   Dr. [name]: patchy mottling/erythema and minimal polypoid appearance of gastric mucosa. bx  with mild inlammation but no H.pylori  . HERNIA REPAIR  [id number]   umbilical  . LEFT HEART CATH AND CORONARY ANGIOGRAPHY N/A 07/30/2016   Procedure: Left Heart Cath and Coronary Angiography;  Surgeon: [name], MD;  Location: [place] [facility];  Service: Cardiovascular;  Laterality: N/A;  . LEFT HEART CATH AND CORONARY ANGIOGRAPHY N/A 01/19/2018   Procedure: LEFT HEART CATH AND CORONARY ANGIOGRAPHY;  Surgeon: [name], MD;  Location: [place] [facility];  Service: Cardiovascular;  Laterality: N/A;  . LESION REMOVAL     Lip  and hand   . LOWER EXTREMITY ANGIOGRAPHY N/A 04/18/2019   Procedure: LOWER EXTREMITY ANGIOGRAPHY;  Surgeon: [name], MD;  Location: [place] [facility];  Service: Cardiovascular;  Laterality: N/A;  . NECK SURGERY    . PERIPHERAL VASCULAR BALLOON ANGIOPLASTY  04/18/2019   Procedure: PERIPHERAL VASCULAR BALLOON ANGIOPLASTY;  Surgeon: [name], MD;  Location: [place] [facility];  Service: Cardiovascular;;  . PERIPHERAL VASCULAR INTERVENTION Right 03/21/2019   Procedure: PERIPHERAL VASCULAR INTERVENTION;  Surgeon: [name], MD;  Location: [place] [facility];  Service: Cardiovascular;  Laterality: Right;  superficial femoral    Current Medications: Outpatient Medications Prior to Visit  Medication Sig Dispense Refill  . apixaban (ELIQUIS) 5 MG TABS tablet Take 1 tablet (5 mg total) by mouth 2 (two) times daily. 180 tablet 4  . Chlorphen-Phenyleph-ASA (ALKA-SELTZER PLUS COLD PO) Take 2 tablets by mouth daily as needed (allergies).    . clopidogrel (PLAVIX) 75 MG tablet Take 1 tablet (75 mg total) by mouth daily. 30 tablet 11  . cyclobenzaprine (FLEXERIL) 10 MG tablet Take 1 tablet (10 mg total) by mouth 3 (three) times daily. 90 tablet 3  . DULoxetine (CYMBALTA) 30 MG capsule Take 1 capsule (30 mg total) by mouth daily. 90 capsule 4  . ergocalciferol (VITAMIN D2) 1.25 MG (50000 UT) capsule Take by mouth.    . fenofibrate 160 MG tablet TAKE 1 TABLET BY MOUTH ONCE DAILY FOR CHOLESTEROL AND TRIGLYCERIDES 90 tablet 4  . furosemide (LASIX) 20 MG tablet Take 1 tablet (20 mg total) by mouth daily as needed for edema. 90 tablet 2  . gabapentin (NEURONTIN) 300 MG capsule Take 2 capsules (600 mg total) by mouth 3 (three) times daily. 180 capsule 2  . HUMALOG KWIKPEN 100 UNIT/ML KwikPen INJECT 5 TO 20 UNITS SUBCUTANEOUSLY THREE TIMES DAILY WITH MEALS 15 mL 0  . insulin degludec (TRESIBA FLEXTOUCH) 200 UNIT/ML FlexTouch Pen Inject 36 Units into the skin at bedtime. 12 pen 2  . insulin  NPH-regular Human (HUMULIN 70/30) (70-30) 100 UNIT/ML injection Inject 90 Units into the skin 2 (two) times daily with a meal. (Patient taking differently: Inject 100 Units into the skin 2 (two) times daily with a meal. ) 60 mL 12  . isosorbide mononitrate (IMDUR) 30 MG 24 hr tablet Take 1 tablet (30 mg total) by mouth daily. 90 tablet 4  . linaclotide (LINZESS) 72 MCG capsule Take 1 capsule (72 mcg total) by mouth daily as needed (constipation). 90 capsule 3  . Menthol, Topical Analgesic, (BLUE-EMU MAXIMUM STRENGTH EX) Apply 1 application topically daily as needed (back pain).    . metoprolol succinate (TOPROL-XL) 50 MG 24 hr tablet Take 1 tablet (50 mg total) by mouth daily. 30 tablet 11  . nitroGLYCERIN (NITROSTAT) 0.4 MG SL tablet Place 1 tablet (0.4 mg total) under the tongue every 5 (five) minutes x 3 doses as needed for chest pain.  25 tablet 3  . pantoprazole (PROTONIX) 40 MG tablet Take 1 tablet (40 mg total) by mouth daily. 90 tablet 1  . Semaglutide, 1 MG/DOSE, (OZEMPIC, 1 MG/DOSE,) 2 MG/1.5ML SOPN Inject 1 mL into the skin once a week. 12 mL 4  . telmisartan (MICARDIS) 40 MG tablet Take 1 tablet (40 mg total) by mouth daily. 90 tablet 4  . triamcinolone (NASACORT ALLERGY 24HR) 55 MCG/ACT AERO nasal inhaler Place 1 spray into the nose daily.      No facility-administered medications prior to visit.     Allergies:   Shellfish allergy, Sulfa antibiotics, Ace inhibitors, Invokana [canagliflozin], Lisinopril, Metformin and related, Pravastatin sodium, Milk-related compounds, Crestor [rosuvastatin], Fenofibrate, Horse-derived products, Iodine, Lexapro [escitalopram oxalate], Lipitor [atorvastatin], Livalo [pitavastatin], Repatha [evolocumab], and Tape   Social History   Socioeconomic History  . Marital status: Married    Spouse name: Not on file  . Number of children: 2  . Years of education: 22  . Highest education level: 12th grade  Occupational History  . Occupation: retired   Tobacco Use  . Smoking status: Former Smoker    Packs/day: 0.25    Years: 51.00    Pack years: 12.75    Types: Cigarettes    Quit date: 08/14/2016    Years since quitting: 3.0  . Smokeless tobacco: Never Used  . Tobacco comment: smokes  a pack a week  Substance and Sexual Activity  . Alcohol use: No    Alcohol/week: 0.0 standard drinks  . Drug use: No  . Sexual activity: Yes  Other Topics Concern  . Not on file  Social History Narrative   Lives with his wife.  Retired.  Unable to afford expensive medicines.     Social Determinants of Health   Financial Resource Strain:   . Difficulty of Paying Living Expenses:   Food Insecurity:   . Worried About Charity fundraiser in the Last Year:   . Arboriculturist in the Last Year:   Transportation Needs:   . Film/video editor (Medical):   Marland Kitchen Lack of Transportation (Non-Medical):   Physical Activity:   . Days of Exercise per Week:   . Minutes of Exercise per Session:   Stress:   . Feeling of Stress :   Social Connections:   . Frequency of Communication with Friends and Family:   . Frequency of Social Gatherings with Friends and Family:   . Attends Religious Services:   . Active Member of Clubs or Organizations:   . Attends Archivist Meetings:   Marland Kitchen Marital Status:      Family History:  The patient's family history includes Alzheimer's disease in his mother; Arthritis in his father and mother; Arthritis/Rheumatoid in his sister; Dementia in his maternal aunt and maternal uncle; Depression in his sister; Diabetes in his father and sister; Heart disease in his father and maternal uncle; Hyperlipidemia in his mother and sister; Hypertension in his mother and sister; Lung cancer in his mother; Stomach cancer in his paternal uncle; Stroke in his father and mother; Valvular heart disease in his father.   Review of Systems:   Please see the history of present illness.     General:  No chills, fever, night sweats or weight  changes.  Cardiovascular:  No chest pain, edema, orthopnea, palpitations, paroxysmal nocturnal dyspnea. Positive for dyspnea on exertion.  Dermatological: No rash, lesions/masses Respiratory: No cough, dyspnea Urologic: No hematuria, dysuria Abdominal:   No nausea, vomiting, diarrhea, bright  red blood per rectum, melena, or hematemesis Neurologic:  No visual changes, wkns, changes in mental status. All other systems reviewed and are otherwise negative except as noted above.   Physical Exam:    VS:  BP (!) 162/70   Pulse 64   Temp (!) 97.4 F (36.3 C)   Ht 6\' 2"  (1.88 m)   Wt 299 lb (135.6 kg)   SpO2 100%   BMI 38.39 kg/m    General: Well developed, overweight Caucasian male appearing in no acute distress. Head: Normocephalic, atraumatic, sclera non-icteric, no xanthomas, nares are without discharge.  Neck: No carotid bruits. JVD not elevated.  Lungs: Respirations regular and unlabored, without wheezes or rales.  Heart: Regular rate and rhythm with occasional ectopic beats. No S3 or S4.  No murmur, no rubs, or gallops appreciated. Abdomen: Soft, non-tender, non-distended with normoactive bowel sounds. No hepatomegaly. No rebound/guarding. No obvious abdominal masses. Msk:  Strength and tone appear normal for age. No joint deformities or effusions. Extremities: No clubbing or cyanosis. No lower extremity edema.  Distal pedal pulses are 2+ bilaterally. Neuro: Alert and oriented X 3. Moves all extremities spontaneously. No focal deficits noted. Psych:  Responds to questions appropriately with a normal affect. Skin: No rashes or lesions noted  Wt Readings from Last 3 Encounters:  08/18/19 299 lb (135.6 kg)  07/26/19 297 lb 12.8 oz (135.1 kg)  06/14/19 (!) 303 lb (137.4 kg)     Studies/Labs Reviewed:   EKG:   04/18/19 SR rate 74 nonspecific ST changes   Recent Labs: 07/26/2019: ALT 21; BUN 26; Creatinine, Ser 1.44; Hemoglobin 14.4; Platelets 353; Potassium 4.4; Sodium 137    Lipid Panel    Component Value Date/Time   CHOL 242 (H) 10/31/2018 0941   TRIG 999 (HH) 10/31/2018 0941   HDL 16 (L) 10/31/2018 0941   CHOLHDL 15.1 (H) 10/31/2018 0941   CHOLHDL 8.5 07/31/2016 0258   VLDL 51 (H) 07/31/2016 0258   LDLCALC Comment 10/31/2018 0941   LDLDIRECT 172 (H) 11/08/2014 0908    Additional studies/ records that were reviewed today include:   Cardiac Catheterization: 07/30/2016 1. Mild nonobstructive LAD stenosis with severe branch vessel disease involving the first and second diagonals 2. Total occlusion of the right coronary artery within the previously implanted stent from 2010 with TIMI 2 antegrade flow and brisk collateral filling the PDA from the septal perforators of the LAD 3. Severe complex disease in the left circumflex extending into the first obtuse marginal branch, treated successfully with overlapping drug-eluting stents 4. Normal LV function by echo assessment  Recommendations:  Resume anticoagulation with Eliquis tomorrow for PAF  Plavix 75 mg daily (loaded with 600 mg in cath lab)  No ASA (has had gingival bleeding and don't think he will tolerate triple anticoagulant therapy)  Consider PCI of the RCA if refractory angina. However, with collateral filling of the PDA and need for chronic anticoagulation, concern over bleeding risk, may be best for medical therapy  Assessment:    CAD/PVD/PAF   Plan:   In order of problems listed above:  1. CAD - s/p DES to RCA in 2010 with catheterization in 07/2016 showing CTO of RCA with collaterals present and DES x2 to LCx and OM1. - No angina  - continue Plavix, BB, and Imdur. Intolerant to statins in the past. Currently enrolled in the Clear Trial.   2. Paroxysmal Atrial Fibrillation - maintaining NSR continue beta blocker and eliquis   3. HTN - Well controlled.  Continue  current medications and low sodium Dash type diet.     4. HLD - Intolerant to multiple statins and Repatha. Continue  with Clear trial   5. PVD:  Improved RLE claudication post stenting SFA 04/18/19 residual total left SFA not amenable to percutaneous intervention f/u [name]  Last ABI on left 0.6 on 03/06/19 f/u ordered by [name]   6. Dyspnea:  Likely functional due to obesity F/U echo to make sure RV/LV function normal still. Told him to take nitro to see if that helps to make sure not anginal equivalent. Exam ok Also will call in Albuterol inhaler for allergy season    Testing/Procedures:  Echo for dyspnea   Follow-Up:  In 6 months   Meds:  Albuterol inhaler     Signed, Jenkins Rouge, MD  08/18/2019 9:58 AM    [facility] [id number] [place], [place] [address] Phone: [phone]

## 2019-08-16 ENCOUNTER — Telehealth: Payer: Self-pay | Admitting: Family

## 2019-08-16 ENCOUNTER — Ambulatory Visit (INDEPENDENT_AMBULATORY_CARE_PROVIDER_SITE_OTHER): Payer: Medicare Other | Admitting: *Deleted

## 2019-08-16 DIAGNOSIS — I6381 Other cerebral infarction due to occlusion or stenosis of small artery: Secondary | ICD-10-CM | POA: Diagnosis not present

## 2019-08-16 LAB — CUP PACEART REMOTE DEVICE CHECK
Date Time Interrogation Session: 20210331034936
Implantable Pulse Generator Implant Date: 20180108

## 2019-08-16 NOTE — Telephone Encounter (Signed)
Patient states he has been having on and off sob since he was put on oxy from pain management.  States that he talked with them and they switched it to hydrocodone but he still is having SOB.  States that the SOB is not all the time but it is on and off.  Per policy we can not see someone with those symptoms in office.  Patient has apt with cardiologist Friday and said he would get them to look at him. Aware to get evaluated asap if worsening.

## 2019-08-16 NOTE — Progress Notes (Signed)
ILR Remote 

## 2019-08-18 ENCOUNTER — Ambulatory Visit: Payer: Medicare Other | Admitting: Cardiovascular Disease

## 2019-08-18 ENCOUNTER — Encounter: Payer: Self-pay | Admitting: Cardiovascular Disease

## 2019-08-18 VITALS — BP 162/70 | HR 64 | Temp 97.4°F | Ht 74.0 in | Wt 299.0 lb

## 2019-08-18 DIAGNOSIS — I739 Peripheral vascular disease, unspecified: Secondary | ICD-10-CM

## 2019-08-18 DIAGNOSIS — I2510 Atherosclerotic heart disease of native coronary artery without angina pectoris: Secondary | ICD-10-CM | POA: Diagnosis not present

## 2019-08-18 MED ORDER — ALBUTEROL SULFATE HFA 108 (90 BASE) MCG/ACT IN AERS: 2.0000 | INHALATION_SPRAY | Freq: Four times a day (QID) | RESPIRATORY_TRACT | 3 refills | Status: DC | PRN

## 2019-08-18 NOTE — Addendum Note (Signed)
Addended byDebbora Lacrosse R on: 08/18/2019 10:03 AM   Modules accepted: Orders

## 2019-08-18 NOTE — Patient Instructions (Addendum)
Medication Instructions:  Your physician recommends that you continue on your current medications as directed. Please refer to the Current Medication list given to you today.  A PRESCRIPTION FOR ALBUTEROL INHALER HAS BEEN SENT IN TO [id number] [facility]ARM[facility]Y   Labwork: NONE  Testing/Procedures: NONE  Follow-Up: Your physician wants you to follow-up in: 6 MONTHS . You will receive a reminder letter in the mail two months in advance. If you don't receive a letter, please call our office to schedule the follow-up appointment.   Any Other Special Instructions Will Be Listed Below (If Applicable).     If you need a refill on your cardiac medications before your next appointment, please call your pharmacy.

## 2019-08-22 ENCOUNTER — Ambulatory Visit: Payer: Medicare Other | Attending: Internal Medicine

## 2019-08-22 DIAGNOSIS — Z23 Encounter for immunization: Secondary | ICD-10-CM

## 2019-08-22 NOTE — Progress Notes (Signed)
   Covid-19 Vaccination Clinic  Name:  [name] [name]    MRN: [id number] DOB: 01/17/1951  08/22/2019  [name] was observed post Covid-19 immunization for 30 minutes based on pre-vaccination screening without incident. He was provided with Vaccine Information Sheet and instruction to access the V-Safe system.   [name] was instructed to call 911 with any severe reactions post vaccine: Marland Kitchen Difficulty breathing  . Swelling of face and throat  . A fast heartbeat  . A bad rash all over body  . Dizziness and weakness   Immunizations Administered    Name Date Dose VIS Date Route   Moderna COVID-19 Vaccine 08/22/2019 10:10 AM 0.5 mL 04/18/2019 Intramuscular   Manufacturer: Moderna   LotMV:[id number]   [id number]

## 2019-09-02 ENCOUNTER — Other Ambulatory Visit: Payer: Self-pay | Admitting: Family

## 2019-09-02 DIAGNOSIS — E1142 Type 2 diabetes mellitus with diabetic polyneuropathy: Secondary | ICD-10-CM

## 2019-09-04 ENCOUNTER — Ambulatory Visit (INDEPENDENT_AMBULATORY_CARE_PROVIDER_SITE_OTHER)
Admission: RE | Admit: 2019-09-04 | Discharge: 2019-09-04 | Disposition: A | Discharge status: Home / Self Care | Payer: Medicare Other | Source: Ambulatory Visit | Attending: Surgery | Admitting: Surgery

## 2019-09-04 ENCOUNTER — Ambulatory Visit (HOSPITAL_COMMUNITY)
Admission: RE | Admit: 2019-09-04 | Discharge: 2019-09-04 | Disposition: A | Discharge status: Home / Self Care | Payer: Medicare Other | Source: Ambulatory Visit | Attending: Surgery | Admitting: Surgery

## 2019-09-04 ENCOUNTER — Ambulatory Visit (INDEPENDENT_AMBULATORY_CARE_PROVIDER_SITE_OTHER): Payer: Medicare Other | Admitting: Surgery

## 2019-09-04 ENCOUNTER — Encounter: Payer: Self-pay | Admitting: Surgery

## 2019-09-04 ENCOUNTER — Other Ambulatory Visit: Payer: Self-pay

## 2019-09-04 VITALS — BP 160/84 | HR 83 | Temp 98.0°F | Resp 20 | Ht 74.0 in | Wt 303.0 lb

## 2019-09-04 DIAGNOSIS — I739 Peripheral vascular disease, unspecified: Secondary | ICD-10-CM | POA: Diagnosis not present

## 2019-09-04 DIAGNOSIS — I70213 Atherosclerosis of native arteries of extremities with intermittent claudication, bilateral legs: Secondary | ICD-10-CM | POA: Insufficient documentation

## 2019-09-04 NOTE — Progress Notes (Signed)
Vascular and Vein Specialist of [facility]  Patient name: [name] [name] MRN: [id number] DOB: 07/17/1950 Sex: male   REASON FOR VISIT:    Follow up  HISOTRY OF PRESENT ILLNESS:    [name] [name] is a 69 y.o. male who I followed for bilateral claudication.  His symptoms progressed and on 04/17/2019 he underwent stenting of multiple right SFA stenoses.  I have tried to recanalize his left superficial femoral artery without success.  He had a significant improvement in the symptoms of his right leg.  We talked about left leg bypass in the past.  He is back for further discussions.  The patient has a history of coronary artery disease. He underwent DES in 2010 in 2018. He suffers from atrial fibrillation. He is on Eliquis and has a loop recorder implanted. He is statin and Repatha intolerant. He is currently involved in a research study to address his hypercholesterolemia. He is a diabetic. PAST MEDICAL HISTORY:   Past Medical History:  Diagnosis Date  . Anxiety   . Arthritis   . CAD (coronary artery disease)    a. 2010: DES to CTO of RCA. EF 55% b. 07/2016: cath showing total occlusion within previously placed RCA stent (collaterals present), severe stenosis along LCx and OM1 (treated with 2 overlapping DES). c. repeat cath in 01/2018 showing patent stents along LCx and OM with CTO of D2, CTO of distal LCx, and CTO of RCA with collaterals present overall unchanged since 2018 with medical management recom  . Cellulitis and abscess rt groin   . Depression   . Diabetes mellitus   . Disorders of iron metabolism   . GERD (gastroesophageal reflux disease)   . Hyperlipidemia   . Hypertension   . Low serum testosterone level   . Medically noncompliant   . Myocardial infarction [facility])      FAMILY HISTORY:   Family History  Problem Relation Age of Onset  . Diabetes Father   . Valvular heart disease Father   . Arthritis Father   . Heart disease  Father   . Stroke Father   . Alzheimer's disease Mother   . Hyperlipidemia Mother   . Hypertension Mother   . Arthritis Mother   . Lung cancer Mother   . Stroke Mother   . Headache Mother   . Arthritis/Rheumatoid Sister   . Diabetes Sister   . Hypertension Sister   . Hyperlipidemia Sister   . Depression Sister   . Dementia Maternal Aunt   . Dementia Maternal Uncle   . Heart disease Maternal Uncle   . Stomach cancer Paternal Uncle   . Colon cancer Neg Hx   . Liver disease Neg Hx     SOCIAL HISTORY:   Social History   Tobacco Use  . Smoking status: Former Smoker    Packs/day: 0.25    Years: 51.00    Pack years: 12.75    Types: Cigarettes    Quit date: 08/14/2016    Years since quitting: 3.0  . Smokeless tobacco: Never Used  . Tobacco comment: smokes  a pack a week  Substance Use Topics  . Alcohol use: No    Alcohol/week: 0.0 standard drinks     ALLERGIES:   Allergies  Allergen Reactions  . Shellfish Allergy Anaphylaxis and Other (See Comments)    Tongue swelling, hives   . Sulfa Antibiotics Anaphylaxis and Rash    Tongue swelling, hives  . Ace Inhibitors Other (See Comments) and Cough  CKD, renal failure   . Invokana [Canagliflozin] Other (See Comments)    Syncope / dehydration  . Lisinopril Cough  . Metformin And Related Itching  . Pravastatin Sodium Other (See Comments)    myalgias  . Milk-Related Compounds     Ties stomach in knots   . Crestor [Rosuvastatin] Other (See Comments)    Myalgias   . Fenofibrate Other (See Comments)    Body aches - pt currently taking isnt sure if its causing any pain  . Horse-Derived Products Rash  . Iodine Other (See Comments)    ????. PATIENT SAYS HE DID FINE WITH LAST CT WITH CONTRAST** patient had IV contrast on 06/21/2018 without pre meds without any issues. Patient had allergic reaction to Shellfish 30 years ago-not to IV contrast.   . Lexapro [Escitalopram Oxalate] Other (See Comments)    Buzzing in  ears,headache, felt like a zombie  . Lipitor [Atorvastatin] Other (See Comments)    myalgias  . Livalo [Pitavastatin] Other (See Comments)    Myalgias   . Repatha [Evolocumab] Other (See Comments)    Myalgias, flu like sx  . Tape Rash     CURRENT MEDICATIONS:   Current Outpatient Medications  Medication Sig Dispense Refill  . albuterol (VENTOLIN HFA) 108 (90 Base) MCG/ACT inhaler Inhale 2 puffs into the lungs every 6 (six) hours as needed for wheezing or shortness of breath. 8.5 g 3  . apixaban (ELIQUIS) 5 MG TABS tablet Take 1 tablet (5 mg total) by mouth 2 (two) times daily. 180 tablet 4  . Chlorphen-Phenyleph-ASA (ALKA-SELTZER PLUS COLD PO) Take 2 tablets by mouth daily as needed (allergies).    . clopidogrel (PLAVIX) 75 MG tablet Take 1 tablet (75 mg total) by mouth daily. 30 tablet 11  . cyclobenzaprine (FLEXERIL) 10 MG tablet Take 1 tablet (10 mg total) by mouth 3 (three) times daily. 90 tablet 3  . DULoxetine (CYMBALTA) 30 MG capsule Take 1 capsule (30 mg total) by mouth daily. 90 capsule 4  . fenofibrate 160 MG tablet TAKE 1 TABLET BY MOUTH ONCE DAILY FOR CHOLESTEROL AND TRIGLYCERIDES 90 tablet 4  . furosemide (LASIX) 20 MG tablet Take 1 tablet (20 mg total) by mouth daily as needed for edema. 90 tablet 2  . gabapentin (NEURONTIN) 300 MG capsule Take 2 capsules (600 mg total) by mouth 3 (three) times daily. 180 capsule 2  . HUMALOG KWIKPEN 100 UNIT/ML KwikPen INJECT 5 TO 20 UNITS SUBCUTANEOUSLY THREE TIMES DAILY WITH MEALS 15 mL 0  . insulin degludec (TRESIBA FLEXTOUCH) 200 UNIT/ML FlexTouch Pen Inject 36 Units into the skin at bedtime. 12 pen 2  . insulin NPH-regular Human (HUMULIN 70/30) (70-30) 100 UNIT/ML injection Inject 90 Units into the skin 2 (two) times daily with a meal. (Patient taking differently: Inject 100 Units into the skin 2 (two) times daily with a meal. ) 60 mL 12  . isosorbide mononitrate (IMDUR) 30 MG 24 hr tablet Take 1 tablet (30 mg total) by mouth daily. 90  tablet 4  . linaclotide (LINZESS) 72 MCG capsule Take 1 capsule (72 mcg total) by mouth daily as needed (constipation). 90 capsule 3  . Menthol, Topical Analgesic, (BLUE-EMU MAXIMUM STRENGTH EX) Apply 1 application topically daily as needed (back pain).    . metoprolol succinate (TOPROL-XL) 50 MG 24 hr tablet Take 1 tablet (50 mg total) by mouth daily. 30 tablet 11  . nitroGLYCERIN (NITROSTAT) 0.4 MG SL tablet Place 1 tablet (0.4 mg total) under the tongue every 5 (  five) minutes x 3 doses as needed for chest pain. 25 tablet 3  . pantoprazole (PROTONIX) 40 MG tablet Take 1 tablet (40 mg total) by mouth daily. 90 tablet 1  . Semaglutide, 1 MG/DOSE, (OZEMPIC, 1 MG/DOSE,) 2 MG/1.5ML SOPN Inject 1 mL into the skin once a week. 12 mL 4  . telmisartan (MICARDIS) 40 MG tablet Take 1 tablet (40 mg total) by mouth daily. 90 tablet 4  . triamcinolone (NASACORT ALLERGY 24HR) 55 MCG/ACT AERO nasal inhaler Place 1 spray into the nose daily.      No current facility-administered medications for this visit.    REVIEW OF SYSTEMS:   [X]  denotes positive finding, [ ]  denotes negative finding Cardiac  Comments:  Chest pain or chest pressure:    Shortness of breath upon exertion:    Short of breath when lying flat:    Irregular heart rhythm:        Vascular    Pain in calf, thigh, or hip brought on by ambulation:    Pain in feet at night that wakes you up from your sleep:     Blood clot in your veins:    Leg swelling:         Pulmonary    Oxygen at home:    Productive cough:     Wheezing:         Neurologic    Sudden weakness in arms or legs:     Sudden numbness in arms or legs:     Sudden onset of difficulty speaking or slurred speech:    Temporary loss of vision in one eye:     Problems with dizziness:         Gastrointestinal    Blood in stool:     Vomited blood:         Genitourinary    Burning when urinating:     Blood in urine:        Psychiatric    Major depression:           Hematologic    Bleeding problems:    Problems with blood clotting too easily:        Skin    Rashes or ulcers:        Constitutional    Fever or chills:      PHYSICAL EXAM:   Vitals:   09/04/19 1051  BP: (!) 160/84  Pulse: 83  Resp: 20  Temp: 98 F (36.7 C)  SpO2: 97%  Weight: (!) 303 lb (137.4 kg)  Height: 6\' 2"  (1.88 m)    GENERAL: The patient is a well-nourished male, in no acute distress. The vital signs are documented above. CARDIAC: There is a regular rate and rhythm.  VASCULAR: Nonpalpable pedal pulses PULMONARY: Non-labored respirations MUSCULOSKELETAL: There are no major deformities or cyanosis. NEUROLOGIC: No focal weakness or paresthesias are detected. PSYCHIATRIC: The patient has a normal affect.  STUDIES:   I have reviewed the following: ABI/TBIToday's ABIToday's TBIPrevious ABIPrevious TBI  +-------+-----------+-----------+------------+------------+  Right 0.72    0.65    0.56    0.62      +-------+-----------+-----------+------------+------------+  Left  0.72    0.55    0.60    0.58      +-------+-----------+-----------+------------+------------+   LE duplex: Right: Patent right superficial femoral artery stent. Proximal stent  narrowing and elevated velocities indicate a stenosis of 50-99% at the low  end of the stenosis range.   Right mid posterior tibial artery is occluded at  the mid segment with  reconstitution via collaterals.   MEDICAL ISSUES:   Bilateral claudication: After revascularization of the right leg, his symptoms in both legs had improved.  He is tolerating the level of discomfort in his left leg and therefore no further revascularization is warranted at this time.  His next procedure would be a left femoral-popliteal bypass graft.  He has developed proximal stenosis within his stents, at the low end of the range of 50-9 9%.  Therefore he will come back in 6 months for surveillance  imaging.    Leia Alf, MD, FACS Vascular and Vein Specialists of [facility] [phone] Pager [phone]

## 2019-09-05 ENCOUNTER — Other Ambulatory Visit: Payer: Self-pay

## 2019-09-05 ENCOUNTER — Encounter: Payer: Self-pay | Admitting: Family Medicine

## 2019-09-05 ENCOUNTER — Ambulatory Visit (INDEPENDENT_AMBULATORY_CARE_PROVIDER_SITE_OTHER): Payer: Medicare Other | Admitting: Family Medicine

## 2019-09-05 VITALS — BP 131/87 | HR 91 | Temp 97.0°F | Ht 74.0 in | Wt 301.0 lb

## 2019-09-05 DIAGNOSIS — E559 Vitamin D deficiency, unspecified: Secondary | ICD-10-CM | POA: Diagnosis not present

## 2019-09-05 DIAGNOSIS — R399 Unspecified symptoms and signs involving the genitourinary system: Secondary | ICD-10-CM

## 2019-09-05 DIAGNOSIS — E1122 Type 2 diabetes mellitus with diabetic chronic kidney disease: Secondary | ICD-10-CM | POA: Diagnosis not present

## 2019-09-05 DIAGNOSIS — R809 Proteinuria, unspecified: Secondary | ICD-10-CM | POA: Diagnosis not present

## 2019-09-05 DIAGNOSIS — E1129 Type 2 diabetes mellitus with other diabetic kidney complication: Secondary | ICD-10-CM | POA: Diagnosis not present

## 2019-09-05 DIAGNOSIS — N189 Chronic kidney disease, unspecified: Secondary | ICD-10-CM | POA: Diagnosis not present

## 2019-09-05 DIAGNOSIS — I129 Hypertensive chronic kidney disease with stage 1 through stage 4 chronic kidney disease, or unspecified chronic kidney disease: Secondary | ICD-10-CM | POA: Diagnosis not present

## 2019-09-05 LAB — URINALYSIS, COMPLETE
Bilirubin, UA: NEGATIVE
Ketones, UA: NEGATIVE
Leukocytes,UA: NEGATIVE
Nitrite, UA: NEGATIVE
Specific Gravity, UA: 1.025 (ref 1.005–1.030)
Urobilinogen, Ur: 1 mg/dL (ref 0.2–1.0)
pH, UA: 6 (ref 5.0–7.5)

## 2019-09-05 LAB — MICROSCOPIC EXAMINATION
Bacteria, UA: NONE SEEN
Epithelial Cells (non renal): NONE SEEN /hpf (ref 0–10)
Renal Epithel, UA: NONE SEEN /hpf
WBC, UA: NONE SEEN /hpf (ref 0–5)

## 2019-09-05 MED ORDER — CIPROFLOXACIN HCL 500 MG PO TABS: 500.0000 mg | ORAL_TABLET | Freq: Two times a day (BID) | ORAL | 0 refills | Status: DC

## 2019-09-05 NOTE — Progress Notes (Signed)
Chief Complaint  Patient presents with  . Back Pain    Min, 1 week  . Urinary Frequency    HPI  Patient presents today for chronic urinary frequency that seems to gotten worse in the last week or so.  He notes that he has had diabetic renal disease documented that.  He takes a fluid pill and understands that he is going to urinate quite frequently.  However it is now associated with the right flank pain that is new.  It feels like pain he is having the past when he has had urinary infections.  He denies fever chills sweats and nausea.  No abdominal discomfort noted.  PMH: Smoking status noted ROS: Per HPI  Objective: BP 131/87   Pulse 91   Temp (!) 97 F (36.1 C) (Temporal)   Ht 6\' 2"  (1.88 m)   Wt (!) 301 lb (136.5 kg)   BMI 38.65 kg/m  Gen: NAD, alert, cooperative with exam HEENT: NCAT, EOMI, PERRL CV: RRR, good S1/S2, no murmur Resp: CTABL, no wheezes, non-labored Abd: SNTND, BS present, no guarding or organomegaly Ext: No edema, warm Neuro: Alert and oriented, No gross deficits Urinalysis shows 3-8 red blood cells on micro.  2+ proteinuria on dipstick. Assessment and plan:  1. UTI symptoms     Meds ordered this encounter  Medications  . ciprofloxacin (CIPRO) 500 MG tablet    Sig: Take 1 tablet (500 mg total) by mouth 2 (two) times daily.    Dispense:  14 tablet    Refill:  0    Orders Placed This Encounter  Procedures  . Urine Culture    Standing Status:   Future    Number of Occurrences:   1    Standing Expiration Date:   10/05/2019  . Urinalysis, Complete   Patient is to follow-up in 1 week with his kidney specialist, Dr. Lowanda Foster.  I gave him a copy of the urinalysis showing the abnormalities.  He will get that to Dr. Lowanda Foster.  Report any further symptoms.  Of note is that he had an ultrasound of his kidney that was essentially normal approximately 2 months ago.  As result he was reluctant to undergo testing for renal stone.  She will let me know though if  pain becomes more intense or if he has other symptoms such as nausea, gross hematuria, fever. Follow up as needed.  Claretta Fraise, MD

## 2019-09-06 ENCOUNTER — Other Ambulatory Visit: Payer: Self-pay | Admitting: *Deleted

## 2019-09-06 DIAGNOSIS — I739 Peripheral vascular disease, unspecified: Secondary | ICD-10-CM

## 2019-09-06 DIAGNOSIS — I70213 Atherosclerosis of native arteries of extremities with intermittent claudication, bilateral legs: Secondary | ICD-10-CM

## 2019-09-07 LAB — URINE CULTURE: Organism ID, Bacteria: NO GROWTH

## 2019-09-13 ENCOUNTER — Other Ambulatory Visit: Payer: Self-pay | Admitting: Family

## 2019-09-13 DIAGNOSIS — E1142 Type 2 diabetes mellitus with diabetic polyneuropathy: Secondary | ICD-10-CM

## 2019-09-16 LAB — CUP PACEART REMOTE DEVICE CHECK
Date Time Interrogation Session: 20210501035409
Implantable Pulse Generator Implant Date: 20180108

## 2019-09-18 ENCOUNTER — Ambulatory Visit (INDEPENDENT_AMBULATORY_CARE_PROVIDER_SITE_OTHER): Payer: Medicare Other | Admitting: *Deleted

## 2019-09-18 DIAGNOSIS — I5030 Unspecified diastolic (congestive) heart failure: Secondary | ICD-10-CM

## 2019-09-18 NOTE — Progress Notes (Signed)
Carelink Summary Report / Loop Recorder 

## 2019-09-26 ENCOUNTER — Encounter: Payer: Medicare Other | Admitting: *Deleted

## 2019-09-26 ENCOUNTER — Other Ambulatory Visit: Payer: Self-pay

## 2019-09-26 VITALS — BP 138/55 | HR 76 | Wt 305.0 lb

## 2019-09-26 DIAGNOSIS — Z006 Encounter for examination for normal comparison and control in clinical research program: Secondary | ICD-10-CM

## 2019-09-26 NOTE — Research (Signed)
Patient was seen in clinic today for visit T12,M30 for Clear Research Study. No AE's/SAE's to report. New medications dispensed to patient and next appointment has been scheduled.

## 2019-09-29 DIAGNOSIS — I5032 Chronic diastolic (congestive) heart failure: Secondary | ICD-10-CM | POA: Diagnosis not present

## 2019-09-29 DIAGNOSIS — N189 Chronic kidney disease, unspecified: Secondary | ICD-10-CM | POA: Diagnosis not present

## 2019-09-29 DIAGNOSIS — I129 Hypertensive chronic kidney disease with stage 1 through stage 4 chronic kidney disease, or unspecified chronic kidney disease: Secondary | ICD-10-CM | POA: Diagnosis not present

## 2019-09-29 DIAGNOSIS — E559 Vitamin D deficiency, unspecified: Secondary | ICD-10-CM | POA: Diagnosis not present

## 2019-09-29 DIAGNOSIS — R809 Proteinuria, unspecified: Secondary | ICD-10-CM | POA: Diagnosis not present

## 2019-10-02 DIAGNOSIS — M25561 Pain in right knee: Secondary | ICD-10-CM | POA: Diagnosis not present

## 2019-10-02 DIAGNOSIS — M1711 Unilateral primary osteoarthritis, right knee: Secondary | ICD-10-CM | POA: Diagnosis not present

## 2019-10-02 DIAGNOSIS — G8929 Other chronic pain: Secondary | ICD-10-CM | POA: Diagnosis not present

## 2019-10-04 DIAGNOSIS — L089 Local infection of the skin and subcutaneous tissue, unspecified: Secondary | ICD-10-CM | POA: Diagnosis not present

## 2019-10-04 DIAGNOSIS — M1711 Unilateral primary osteoarthritis, right knee: Secondary | ICD-10-CM | POA: Diagnosis not present

## 2019-10-20 DIAGNOSIS — G8929 Other chronic pain: Secondary | ICD-10-CM | POA: Diagnosis not present

## 2019-10-20 DIAGNOSIS — M25561 Pain in right knee: Secondary | ICD-10-CM | POA: Diagnosis not present

## 2019-10-20 LAB — CUP PACEART REMOTE DEVICE CHECK
Date Time Interrogation Session: 20210603230802
Implantable Pulse Generator Implant Date: 20180108

## 2019-10-23 ENCOUNTER — Telehealth: Payer: Self-pay

## 2019-10-23 DIAGNOSIS — R0600 Dyspnea, unspecified: Secondary | ICD-10-CM

## 2019-10-23 NOTE — Telephone Encounter (Signed)
° °  [facility] Pre-operative Risk Assessment    HEARTCARE STAFF: - Please ensure there is not already an duplicate clearance open for this procedure. - Under Visit Info/Reason for Call, type in Other and utilize the format Clearance MM/DD/YY or Clearance TBD. Do not use dashes or single digits. - If request is for dental extraction, please clarify the # of teeth to be extracted.  Request for surgical clearance:  1. What type of surgery is being performed? Left Lumbar Transforaminal Epidural Steroid Injections  2. When is this surgery scheduled? No  3. What type of clearance is required (medical clearance vs. Pharmacy clearance to hold med vs. Both)? Pharmacy  4. Are there any medications that need to be held prior to surgery and how long? We would like permission to discontinue the blood thinner Plavix for 7 days prior and Eliquis 3 days prior    5. Practice name and name of physician performing surgery? [facility], Dr. Dorene Ar   6. What is the office phone number? [phone]   7.   What is the office fax number? [phone]  8.   Anesthesia type (None, local, MAC, general) ? None    Michaelyn Barter 10/23/2019, 3:27 PM  _________________________________________________________________

## 2019-10-24 NOTE — Telephone Encounter (Signed)
Left message for patient to call back. Will place order for echo to be done at [facility], since patient is a [place] Patient.

## 2019-10-24 NOTE — Telephone Encounter (Signed)
Ok to hold plavix for 7 days and eliquis 3 days Make sure echo gets done but does not need to be done before surgery

## 2019-10-24 NOTE — Telephone Encounter (Signed)
Patient called back. Informed him that Dr. [name] would like him to have an echo. Will send message to [facility] pool to help get echo scheduled.

## 2019-10-24 NOTE — Telephone Encounter (Signed)
Patient returning call.

## 2019-10-24 NOTE — Telephone Encounter (Signed)
   Primary Cardiologist: Carlyle Dolly, MD  Chart reviewed as part of pre-operative protocol coverage. Patient scheduled for left lumbar transforaminal ESI and we were asked to give our recommendations for holding Plavix and Eliquis. Per Dr. [name], [place] to hold Plavix for 7 days and Eliquis for 3 days prior to procedure. Both should be resumed as soon as possible following spinal injections.   I will route this recommendation to the requesting party via Epic fax function and remove from pre-op pool.  Please call with questions.  Darreld Mclean, PA-C 10/24/2019, 11:35 AM

## 2019-10-24 NOTE — Telephone Encounter (Signed)
Dr. [name], patient has upcoming left lumbar transforaminal ESI planned (date TBD) and needs to hold Plavix and Eliquis. He has a significant history of CAD (s/p DES to RCA in 2010, cath in 07/2016 showing CTO of RCA with collaterals present and DES x2 to LCx and OM1) as well as PAF. You recently saw patient in 08/2019 at which time he reported some exertional dyspnea as well has occasional episodes of chest tightness and dyspnea which he felt was related to being recently switched to Oxycontin. This was not felt to be angina. You note mentions getting a repeat Echo for further evaluation of dyspnea but I don't see that this has been done.   1. Do you feel like he still needs an Echo before back injections? 2. Is it OK for patient to hold Plavix for 7 days and Eliquis for 3 days before surgery?  Please route response back to P CV DIV PREOP.  Thank you! Rozlynn Lippold

## 2019-10-30 ENCOUNTER — Other Ambulatory Visit: Payer: Self-pay

## 2019-10-30 ENCOUNTER — Encounter: Payer: Self-pay | Admitting: Family

## 2019-10-30 ENCOUNTER — Ambulatory Visit (INDEPENDENT_AMBULATORY_CARE_PROVIDER_SITE_OTHER): Payer: Medicare Other | Admitting: Family

## 2019-10-30 VITALS — BP 100/57 | HR 60 | Temp 97.1°F | Ht 74.0 in | Wt 297.2 lb

## 2019-10-30 DIAGNOSIS — I480 Paroxysmal atrial fibrillation: Secondary | ICD-10-CM

## 2019-10-30 DIAGNOSIS — E1142 Type 2 diabetes mellitus with diabetic polyneuropathy: Secondary | ICD-10-CM

## 2019-10-30 DIAGNOSIS — I10 Essential (primary) hypertension: Secondary | ICD-10-CM

## 2019-10-30 DIAGNOSIS — I252 Old myocardial infarction: Secondary | ICD-10-CM

## 2019-10-30 DIAGNOSIS — I2510 Atherosclerotic heart disease of native coronary artery without angina pectoris: Secondary | ICD-10-CM

## 2019-10-30 DIAGNOSIS — I5030 Unspecified diastolic (congestive) heart failure: Secondary | ICD-10-CM | POA: Diagnosis not present

## 2019-10-30 DIAGNOSIS — E1159 Type 2 diabetes mellitus with other circulatory complications: Secondary | ICD-10-CM | POA: Diagnosis not present

## 2019-10-30 DIAGNOSIS — Z794 Long term (current) use of insulin: Secondary | ICD-10-CM | POA: Diagnosis not present

## 2019-10-30 DIAGNOSIS — K5900 Constipation, unspecified: Secondary | ICD-10-CM

## 2019-10-30 DIAGNOSIS — Z9861 Coronary angioplasty status: Secondary | ICD-10-CM | POA: Diagnosis not present

## 2019-10-30 DIAGNOSIS — F419 Anxiety disorder, unspecified: Secondary | ICD-10-CM

## 2019-10-30 DIAGNOSIS — R55 Syncope and collapse: Secondary | ICD-10-CM

## 2019-10-30 DIAGNOSIS — E1169 Type 2 diabetes mellitus with other specified complication: Secondary | ICD-10-CM

## 2019-10-30 DIAGNOSIS — E785 Hyperlipidemia, unspecified: Secondary | ICD-10-CM

## 2019-10-30 DIAGNOSIS — F331 Major depressive disorder, recurrent, moderate: Secondary | ICD-10-CM

## 2019-10-30 DIAGNOSIS — K219 Gastro-esophageal reflux disease without esophagitis: Secondary | ICD-10-CM

## 2019-10-30 DIAGNOSIS — E0841 Diabetes mellitus due to underlying condition with diabetic mononeuropathy: Secondary | ICD-10-CM

## 2019-10-30 DIAGNOSIS — M5030 Other cervical disc degeneration, unspecified cervical region: Secondary | ICD-10-CM

## 2019-10-30 LAB — BAYER DCA HB A1C WAIVED: HB A1C (BAYER DCA - WAIVED): 6.9 % (ref <7.0)

## 2019-10-30 MED ORDER — TELMISARTAN 20 MG PO TABS: 20.0000 mg | ORAL_TABLET | Freq: Every day | ORAL | 1 refills | Status: DC

## 2019-10-30 NOTE — Patient Instructions (Signed)
Syncope  Syncope refers to a condition in which a person temporarily loses consciousness. Syncope may also be called fainting or passing out. It is caused by a sudden decrease in blood flow to the brain. Even though most causes of syncope are not dangerous, syncope can be a sign of a serious medical problem. Your health care provider may do tests to find the reason why you are having syncope. Signs that you may be about to faint include:  Feeling dizzy or light-headed.  Feeling nauseous.  Seeing all white or all black in your field of vision.  Having cold, clammy skin. If you faint, get medical help right away. Call your local emergency services (911 in the [place] Do not drive yourself to the hospital. Follow these instructions at home: Pay attention to any changes in your symptoms. Take these actions to stay safe and to help relieve your symptoms: Lifestyle  Do not drive, use machinery, or play sports until your health care provider says it is okay.  Do not drink alcohol.  Do not use any products that contain nicotine or tobacco, such as cigarettes and e-cigarettes. If you need help quitting, ask your health care provider.  Drink enough fluid to keep your urine pale yellow. General instructions  Take over-the-counter and prescription medicines only as told by your health care provider.  If you are taking blood pressure or heart medicine, get up slowly and take several minutes to sit and then stand. This can reduce dizziness or light-headedness.  Have someone stay with you until you feel stable.  If you start to feel like you might faint, lie down right away and raise (elevate) your feet above the level of your heart. Breathe deeply and steadily. Wait until all the symptoms have passed.  Keep all follow-up visits as told by your health care provider. This is important. Get help right away if you:  Have a severe headache.  Faint once or repeatedly.  Have pain in your chest,  abdomen, or back.  Have a very fast or irregular heartbeat (palpitations).  Have pain when you breathe.  Are bleeding from your mouth or rectum, or you have black or tarry stool.  Have a seizure.  Are confused.  Have trouble walking.  Have severe weakness.  Have vision problems. These symptoms may represent a serious problem that is an emergency. Do not wait to see if your symptoms will go away. Get medical help right away. Call your local emergency services (911 in the [place] Do not drive yourself to the hospital. Summary  Syncope refers to a condition in which a person temporarily loses consciousness. It is caused by a sudden decrease in blood flow to the brain.  Signs that you may be about to faint include dizziness, feeling light-headed, feeling nauseous, sudden vision changes, or cold, clammy skin.  Although most causes of syncope are not dangerous, syncope can be a sign of a serious medical problem. If you faint, get medical help right away. This information is not intended to replace advice given to you by your health care provider. Make sure you discuss any questions you have with your health care provider. Document Revised: 04/16/2017 Document Reviewed: 04/12/2017 Elsevier Patient Education  2020 [employer]  

## 2019-10-30 NOTE — Progress Notes (Signed)
Subjective:    Patient ID: [name] [name], male    DOB: [birth year], 69 y.o.   MRN: [id number]  Chief Complaint  Patient presents with  . Diabetes  . Hypertension   PT presents to the office today for chronic follow up. PT had NSTEMI on 07/30/16 and stent placed. Pt is followed by Cardiologistsevery 4 months. Pt currently in a clinical trial for hyperlipidemia.He is followed by Ortho for osteoarthritis and is currently getting injections in bilateral knees.Pt is followed by Pain Management every 2-3 months. Followed by Nephrologists every 3 months for CKD.   He is followed by Vascular and had stent placed in his right leg.  He reports he had an episode two days ago that he states after he ate his vision became dark and felt weak. His BP is lower today.  Hypertension This is a chronic problem. The current episode started more than 1 year ago. The problem has been resolved since onset. The problem is controlled. Associated symptoms include anxiety, blurred vision and malaise/fatigue. Pertinent negatives include no peripheral edema or shortness of breath. Risk factors for coronary artery disease include dyslipidemia, diabetes mellitus, obesity, male gender and sedentary lifestyle. The current treatment provides moderate improvement. Hypertensive end-organ damage includes CAD/MI and heart failure.  Congestive Heart Failure Presents for follow-up visit. Pertinent negatives include no edema or shortness of breath. The symptoms have been stable.  Gastroesophageal Reflux He complains of belching and heartburn. This is a chronic problem. He has tried a PPI for the symptoms. The treatment provided moderate relief.  Hyperlipidemia This is a chronic problem. The current episode started more than 1 year ago. The problem is uncontrolled. Recent lipid tests were reviewed and are high. Exacerbating diseases include obesity. Pertinent negatives include no shortness of breath. Current antihyperlipidemic  treatment includes statins. The current treatment provides moderate improvement of lipids. Risk factors for coronary artery disease include dyslipidemia, diabetes mellitus, male sex, hypertension and a sedentary lifestyle.  Diabetes He presents for his follow-up diabetic visit. He has type 2 diabetes mellitus. Hypoglycemia symptoms include nervousness/anxiousness. Associated symptoms include blurred vision and foot paresthesias. There are no hypoglycemic complications. Symptoms are stable. Diabetic complications include heart disease, nephropathy and peripheral neuropathy. Risk factors for coronary artery disease include dyslipidemia, diabetes mellitus, male sex, hypertension and sedentary lifestyle. He is following a generally healthy diet. His overall blood glucose range is 110-130 mg/dl. Eye exam is current.  Arthritis Presents for follow-up visit. He complains of pain and stiffness. The symptoms have been stable. Affected locations include the neck, left knee and right knee. His pain is at a severity of 10/10.  Depression        This is a chronic problem.  The current episode started more than 1 year ago.   The problem occurs constantly.  Associated symptoms include irritable, restlessness and sad.  Associated symptoms include no helplessness and no hopelessness.  Past treatments include SNRIs - Serotonin and norepinephrine reuptake inhibitors.  Past medical history includes anxiety.   Constipation This is a chronic problem. The current episode started more than 1 year ago. The problem has been waxing and waning since onset.  Anxiety Presents for follow-up visit. Symptoms include depressed mood, irritability, nervous/anxious behavior and restlessness. Patient reports no shortness of breath.        Review of Systems  Constitutional: Positive for irritability and malaise/fatigue.  Eyes: Positive for blurred vision.  Respiratory: Negative for shortness of breath.   Gastrointestinal: Positive  for constipation and  heartburn.  Musculoskeletal: Positive for arthritis and stiffness.  Psychiatric/Behavioral: Positive for depression. The patient is nervous/anxious.   All other systems reviewed and are negative.      Objective:   Physical Exam Vitals reviewed.  Constitutional:      General: He is irritable. He is not in acute distress.    Appearance: He is well-developed. He is obese.  HENT:     Head: Normocephalic.     Right Ear: Tympanic membrane normal.     Left Ear: Tympanic membrane normal.  Eyes:     General:        Right eye: No discharge.        Left eye: No discharge.     Pupils: Pupils are equal, round, and reactive to light.  Neck:     Thyroid: No thyromegaly.  Cardiovascular:     Rate and Rhythm: Normal rate and regular rhythm.     Heart sounds: Normal heart sounds. No murmur heard.   Pulmonary:     Effort: Pulmonary effort is normal. No respiratory distress.     Breath sounds: Normal breath sounds. No wheezing.  Abdominal:     General: Bowel sounds are normal. There is no distension.     Palpations: Abdomen is soft.     Tenderness: There is no abdominal tenderness.  Musculoskeletal:        General: No tenderness. Normal range of motion.     Cervical back: Normal range of motion and neck supple.  Skin:    General: Skin is warm and dry.     Findings: No erythema or rash.  Neurological:     Mental Status: He is alert and oriented to person, place, and time.     Cranial Nerves: No cranial nerve deficit.     Deep Tendon Reflexes: Reflexes are normal and symmetric.  Psychiatric:        Behavior: Behavior normal.        Thought Content: Thought content normal.        Judgment: Judgment normal.       BP (!) 100/57   Pulse 60   Temp (!) 97.1 F (36.2 C) (Temporal)   Ht _0  (1.88 m)   Wt 297 lb 3.2 oz (134.8 kg)   SpO2 (!) 60%   BMI 38.16 kg/m      Assessment & Plan:  [name] [name] comes in today with chief complaint of Diabetes (trasebia  causing itching) and Hypertension (BP getting low )   Diagnosis and orders addressed:  1. Type 2 diabetes mellitus with diabetic polyneuropathy, with long-term current use of insulin (HCC) - Bayer DCA Hb A1c Waived - CMP14+EGFR - CBC with Differential/Platelet  2. Paroxysmal atrial fibrillation (HCC) - CMP14+EGFR - CBC with Differential/Platelet  3. CAD S/P percutaneous coronary angioplasty - CMP14+EGFR - CBC with Differential/Platelet  4. Diastolic congestive heart failure, unspecified HF chronicity (HCC) - CMP14+EGFR - CBC with Differential/Platelet  5. Hypertension associated with diabetes ([place]) Will decrease Telmisartan to 20 mg from 40 mg - CMP14+EGFR - CBC with Differential/Platelet - telmisartan (MICARDIS) 20 MG tablet; Take 1 tablet (20 mg total) by mouth daily.  Dispense: 90 tablet; Refill: 1  6. Gastroesophageal reflux disease without esophagitis - CMP14+EGFR - CBC with Differential/Platelet  7. Hyperlipidemia associated with type 2 diabetes mellitus (HCC) - CMP14+EGFR - CBC with Differential/Platelet  8. Anxiety - CMP14+EGFR - CBC with Differential/Platele 9. Constipation, unspecified constipation type - CMP14+EGFR - CBC with Differential/Platelet  10. Moderate episode of  recurrent major depressive disorder (HCC) - CMP14+EGFR - CBC with Differential/Platelet  11. History of non-ST elevation myocardial infarction (NSTEMI)  - CMP14+EGFR - CBC with Differential/Platelet  12. Morbid obesity ([place]) - CMP14+EGFR - CBC with Differential/Platelet  13. DDD (degenerative disc disease), cervical - CMP14+EGFR - CBC with Differential/Platelet  14. Diabetic peripheral neuropathy associated with type 2 diabetes mellitus (HCC) - CMP14+EGFR - CBC with Differential/Platelet  15. Diabetic mononeuropathy associated with diabetes mellitus due to underlying condition ([place]) - CMP14+EGFR - CBC with Differential/Platelet   Labs pending Health Maintenance  reviewed Diet and exercise encouraged  Follow up plan: 2 weeks to recheck HTN and near syncope   [name], FNP

## 2019-10-31 LAB — CMP14+EGFR
ALT: 18 IU/L (ref 0–44)
AST: 24 IU/L (ref 0–40)
Albumin/Globulin Ratio: 1.3 (ref 1.2–2.2)
Albumin: 4.3 g/dL (ref 3.8–4.8)
Alkaline Phosphatase: 69 IU/L (ref 48–121)
BUN/Creatinine Ratio: 19 (ref 10–24)
BUN: 29 mg/dL — ABNORMAL HIGH (ref 8–27)
Bilirubin Total: 0.6 mg/dL (ref 0.0–1.2)
CO2: 23 mmol/L (ref 20–29)
Calcium: 10.3 mg/dL — ABNORMAL HIGH (ref 8.6–10.2)
Chloride: 99 mmol/L (ref 96–106)
Creatinine, Ser: 1.56 mg/dL — ABNORMAL HIGH (ref 0.76–1.27)
GFR calc Af Amer: 52 mL/min/{1.73_m2} — ABNORMAL LOW (ref >59)
GFR calc non Af Amer: 45 mL/min/{1.73_m2} — ABNORMAL LOW (ref >59)
Globulin, Total: 3.2 g/dL (ref 1.5–4.5)
Glucose: 89 mg/dL (ref 65–99)
Potassium: 4.6 mmol/L (ref 3.5–5.2)
Sodium: 143 mmol/L (ref 134–144)
Total Protein: 7.5 g/dL (ref 6.0–8.5)

## 2019-10-31 LAB — CBC WITH DIFFERENTIAL/PLATELET
Basophils Absolute: 0.1 10*3/uL (ref 0.0–0.2)
Basos: 1 %
EOS (ABSOLUTE): 0.3 10*3/uL (ref 0.0–0.4)
Eos: 3 %
Hematocrit: 42.5 % (ref 37.5–51.0)
Hemoglobin: 13.7 g/dL (ref 13.0–17.7)
Immature Grans (Abs): 0.1 10*3/uL (ref 0.0–0.1)
Immature Granulocytes: 1 %
Lymphocytes Absolute: 1.8 10*3/uL (ref 0.7–3.1)
Lymphs: 21 %
MCH: 27.7 pg (ref 26.6–33.0)
MCHC: 32.2 g/dL (ref 31.5–35.7)
MCV: 86 fL (ref 79–97)
Monocytes Absolute: 0.7 10*3/uL (ref 0.1–0.9)
Monocytes: 8 %
Neutrophils Absolute: 5.4 10*3/uL (ref 1.4–7.0)
Neutrophils: 66 %
Platelets: 333 10*3/uL (ref 150–450)
RBC: 4.95 x10E6/uL (ref 4.14–5.80)
RDW: 14.1 % (ref 11.6–15.4)
WBC: 8.3 10*3/uL (ref 3.4–10.8)

## 2019-11-01 ENCOUNTER — Other Ambulatory Visit: Payer: Self-pay

## 2019-11-01 ENCOUNTER — Ambulatory Visit (HOSPITAL_COMMUNITY)
Admission: RE | Admit: 2019-11-01 | Discharge: 2019-11-01 | Disposition: A | Discharge status: Home / Self Care | Payer: Medicare Other | Source: Ambulatory Visit | Attending: Cardiovascular Disease | Admitting: Cardiovascular Disease

## 2019-11-01 ENCOUNTER — Telehealth: Payer: Self-pay | Admitting: Cardiovascular Disease

## 2019-11-01 DIAGNOSIS — R0600 Dyspnea, unspecified: Secondary | ICD-10-CM | POA: Diagnosis not present

## 2019-11-01 NOTE — Progress Notes (Signed)
*  PRELIMINARY RESULTS* Echocardiogram 2D Echocardiogram has been performed.  Samuel Germany 11/01/2019, 9:27 AM

## 2019-11-01 NOTE — Telephone Encounter (Signed)
  Wife was returning call regarding echo results. Call transferred to nurse.

## 2019-11-03 ENCOUNTER — Telehealth: Payer: Self-pay

## 2019-11-03 NOTE — Telephone Encounter (Signed)
Carelink alert received for LINQ battery has reached RRT as of 11/01/19. Currently taking Eliquis.   Spoke to patient, discussed option of leaving in or taking out. Reported he would like to leave device in place. Advised  Patient to continue current medications.

## 2019-11-06 DIAGNOSIS — M4726 Other spondylosis with radiculopathy, lumbar region: Secondary | ICD-10-CM | POA: Diagnosis not present

## 2019-11-06 DIAGNOSIS — M5136 Other intervertebral disc degeneration, lumbar region: Secondary | ICD-10-CM | POA: Diagnosis not present

## 2019-11-06 DIAGNOSIS — M48061 Spinal stenosis, lumbar region without neurogenic claudication: Secondary | ICD-10-CM | POA: Diagnosis not present

## 2019-11-10 ENCOUNTER — Other Ambulatory Visit: Payer: Self-pay | Admitting: Family

## 2019-11-10 DIAGNOSIS — E1142 Type 2 diabetes mellitus with diabetic polyneuropathy: Secondary | ICD-10-CM

## 2019-11-10 NOTE — Telephone Encounter (Signed)
Pt and wife called stating that they spoke with the pharmacy [facility] [place]) who was supposed to send over a request to [place] to have Humalog Kwikpen refilled. Wife says normally he would have more left than he does but he had to have a steroid shot which is the reason is is almost out.

## 2019-11-15 ENCOUNTER — Other Ambulatory Visit: Payer: Self-pay | Admitting: Family

## 2019-11-15 ENCOUNTER — Other Ambulatory Visit: Payer: Self-pay | Admitting: Family Medicine

## 2019-11-15 DIAGNOSIS — E1142 Type 2 diabetes mellitus with diabetic polyneuropathy: Secondary | ICD-10-CM

## 2019-11-16 ENCOUNTER — Encounter: Payer: Self-pay | Admitting: Family

## 2019-11-16 ENCOUNTER — Other Ambulatory Visit: Payer: Self-pay

## 2019-11-16 ENCOUNTER — Ambulatory Visit (INDEPENDENT_AMBULATORY_CARE_PROVIDER_SITE_OTHER): Payer: Medicare Other | Admitting: Family

## 2019-11-16 VITALS — BP 131/67 | HR 72 | Temp 97.8°F | Ht 74.0 in | Wt 295.2 lb

## 2019-11-16 DIAGNOSIS — I152 Hypertension secondary to endocrine disorders: Secondary | ICD-10-CM

## 2019-11-16 DIAGNOSIS — R55 Syncope and collapse: Secondary | ICD-10-CM | POA: Diagnosis not present

## 2019-11-16 DIAGNOSIS — G720 Drug-induced myopathy: Secondary | ICD-10-CM | POA: Diagnosis not present

## 2019-11-16 DIAGNOSIS — I10 Essential (primary) hypertension: Secondary | ICD-10-CM | POA: Diagnosis not present

## 2019-11-16 DIAGNOSIS — E1159 Type 2 diabetes mellitus with other circulatory complications: Secondary | ICD-10-CM

## 2019-11-16 MED ORDER — GABAPENTIN 300 MG PO CAPS: 600.0000 mg | ORAL_CAPSULE | Freq: Three times a day (TID) | ORAL | 3 refills | Status: DC

## 2019-11-16 NOTE — Patient Instructions (Signed)
Near-Syncope Near-syncope is when you suddenly feel like you might pass out (faint), but you do not actually lose consciousness. This may also be referred to as presyncope. During an episode of near-syncope, you may:  Feel dizzy, weak, or light-headed.  Feel nauseous.  See all white or all black in your field of vision, or see spots.  Have cold, clammy skin. This condition is caused by a sudden decrease in blood flow to the brain. This decrease can result from various causes, but most of those causes are not dangerous. However, near-syncope may be a sign of a serious medical problem, so it is important to seek medical care. Follow these instructions at home: Medicines  Take over-the-counter and prescription medicines only as told by your health care provider.  If you are taking blood pressure or heart medicine, get up slowly and take several minutes to sit and then stand. This can reduce dizziness. General instructions  Pay attention to any changes in your symptoms.  Talk with your health care provider about your symptoms. You may need to have testing to understand the cause of your near-syncope.  If you start to feel like you might faint, lie down right away and raise (elevate) your feet above the level of your heart. Breathe deeply and steadily. Wait until all of the symptoms have passed.  Have someone stay with you until you feel stable.  Do not drive, use machinery, or play sports until your health care provider says it is okay.  Drink enough fluid to keep your urine pale yellow.  Keep all follow-up visits as told by your health care provider. This is important. Get help right away if you:  Have a seizure.  Have unusual pain in your chest, abdomen, or back.  Faint once or repeatedly.  Have a severe headache.  Are bleeding from your mouth or rectum, or you have black or tarry stool.  Have a very fast or irregular heartbeat (palpitations).  Are confused.  Have  trouble walking.  Have severe weakness.  Have vision problems. These symptoms may represent a serious problem that is an emergency. Do not wait to see if your symptoms will go away. Get medical help right away. Call your local emergency services (911 in the [place] Do not drive yourself to the hospital. Summary  Near-syncope is when you suddenly feel like you might pass out (faint), but you do not actually lose consciousness.  This condition is caused by a sudden decrease in blood flow to the brain. This decrease can result from various causes, but most of those causes are not dangerous.  Near-syncope may be a sign of a serious medical problem, so it is important to seek medical care. This information is not intended to replace advice given to you by your health care provider. Make sure you discuss any questions you have with your health care provider. Document Revised: 08/26/2018 Document Reviewed: 03/23/2018 Elsevier Patient Education  2020 [employer]  

## 2019-11-16 NOTE — Progress Notes (Signed)
Subjective:    Patient ID: [name] [name], male    DOB: 05-16-1951, 69 y.o.   MRN: [id number]  Chief Complaint  Patient presents with  . Hypertension    2 weeks to recheck HTN and near syncope had steroid injection about 2 weeks ago. Had one episoid of blacking out  . Blurred Vision    Vision changes   . Headache   PT presents to the office today to recheck HTN and near syncope episodes. He reports he had another episode in the last two weeks. He reports he will become sweaty, his vision goes black, he can still hear his wife, but can not answer. He reports it lasts about 10 mins. Then after he is very fatigued. He reports his BP and blood glucose was stable.   We also decreased his telmisartan to 20 mg from 40 mg on his last visit thinking it may have been the cause of his syncope episode, but his BP is stable today.   Hypertension This is a chronic problem. The current episode started more than 1 year ago. The problem has been waxing and waning since onset. The problem is uncontrolled. Associated symptoms include headaches and malaise/fatigue. Pertinent negatives include no peripheral edema or shortness of breath. Risk factors for coronary artery disease include obesity, male gender, sedentary lifestyle and dyslipidemia. Hypertensive end-organ damage includes CAD/MI. There is no history of kidney disease.  Headache  His past medical history is significant for hypertension.      Review of Systems  Constitutional: Positive for malaise/fatigue.  Respiratory: Negative for shortness of breath.   Neurological: Positive for headaches.  All other systems reviewed and are negative.      Objective:   Physical Exam Vitals reviewed.  Constitutional:      General: He is not in acute distress.    Appearance: He is well-developed.  HENT:     Head: Normocephalic.     Right Ear: External ear normal.     Left Ear: External ear normal.  Eyes:     General:        Right eye: No discharge.          Left eye: No discharge.  Neck:     Thyroid: No thyromegaly.  Cardiovascular:     Rate and Rhythm: Normal rate and regular rhythm.     Heart sounds: Normal heart sounds. No murmur heard.   Pulmonary:     Effort: Pulmonary effort is normal. No respiratory distress.     Breath sounds: Normal breath sounds. No wheezing.  Abdominal:     General: Bowel sounds are normal. There is no distension.     Palpations: Abdomen is soft.     Tenderness: There is no abdominal tenderness.  Musculoskeletal:        General: No tenderness. Normal range of motion.     Cervical back: Normal range of motion and neck supple.     Comments: Bilateral legs cool   Skin:    General: Skin is warm and dry.     Findings: No erythema or rash.  Neurological:     Mental Status: He is alert and oriented to person, place, and time.     Cranial Nerves: No cranial nerve deficit.     Deep Tendon Reflexes: Reflexes are normal and symmetric.  Psychiatric:        Behavior: Behavior normal.        Thought Content: Thought content normal.  Judgment: Judgment normal.       BP (!) 164/74   Pulse 80   Temp 97.8 F (36.6 C) (Temporal)   Ht 6\' 2"  (1.88 m)   Wt 295 lb 3.2 oz (133.9 kg)   BMI 37.90 kg/m      Assessment & Plan:  [name] [name] comes in today with chief complaint of Hypertension (2 weeks to recheck HTN and near syncope had steroid injection about 2 weeks ago. Had one episoid of blacking out), Blurred Vision (Vision changes ), and Headache   Diagnosis and orders addressed:  1. Hypertension associated with diabetes ([place]) At goal -Daily blood pressure log given with instructions on how to fill out and told to bring to next visit -Dash diet information given -Exercise encouraged - Stress Management  -Continue current meds  2. Near syncope Will do referral Neurologists  Keep good control of glucose and hypertension  Stay hydrated Keep follow up with Cardiologists  - Ambulatory referral to  Neurology   [name], FNP

## 2019-11-23 LAB — CUP PACEART REMOTE DEVICE CHECK
Date Time Interrogation Session: 20210707232633
Implantable Pulse Generator Implant Date: 20180108

## 2019-12-06 ENCOUNTER — Telehealth: Payer: Self-pay | Admitting: Pharmacist

## 2019-12-06 DIAGNOSIS — G720 Drug-induced myopathy: Secondary | ICD-10-CM | POA: Insufficient documentation

## 2019-12-06 DIAGNOSIS — T466X5A Adverse effect of antihyperlipidemic and antiarteriosclerotic drugs, initial encounter: Secondary | ICD-10-CM | POA: Insufficient documentation

## 2019-12-06 NOTE — Telephone Encounter (Signed)
ICD 10 code: G72 added to patient's last encounter (drug induced myopathy)  Statin allergy updated in the EMR Medications reviewed Patient follows with cardiology and is currently taking fenofibrate for HLD,  He is currently enrolled in the [facility] to follow and make adjustments

## 2019-12-07 DIAGNOSIS — M25561 Pain in right knee: Secondary | ICD-10-CM | POA: Diagnosis not present

## 2019-12-07 DIAGNOSIS — G8929 Other chronic pain: Secondary | ICD-10-CM | POA: Diagnosis not present

## 2019-12-07 DIAGNOSIS — M1711 Unilateral primary osteoarthritis, right knee: Secondary | ICD-10-CM | POA: Diagnosis not present

## 2019-12-11 ENCOUNTER — Other Ambulatory Visit: Payer: Medicare Other

## 2019-12-11 ENCOUNTER — Other Ambulatory Visit: Payer: Self-pay

## 2019-12-11 ENCOUNTER — Telehealth: Payer: Self-pay | Admitting: *Deleted

## 2019-12-11 DIAGNOSIS — R809 Proteinuria, unspecified: Secondary | ICD-10-CM | POA: Diagnosis not present

## 2019-12-11 DIAGNOSIS — I129 Hypertensive chronic kidney disease with stage 1 through stage 4 chronic kidney disease, or unspecified chronic kidney disease: Secondary | ICD-10-CM | POA: Diagnosis not present

## 2019-12-11 DIAGNOSIS — E1129 Type 2 diabetes mellitus with other diabetic kidney complication: Secondary | ICD-10-CM | POA: Diagnosis not present

## 2019-12-11 DIAGNOSIS — E1122 Type 2 diabetes mellitus with diabetic chronic kidney disease: Secondary | ICD-10-CM | POA: Diagnosis not present

## 2019-12-11 DIAGNOSIS — N189 Chronic kidney disease, unspecified: Secondary | ICD-10-CM | POA: Diagnosis not present

## 2019-12-11 NOTE — Telephone Encounter (Signed)
TC w/ [facility] Needs mg dosing for Ozempic for clarification instead of ml's

## 2019-12-12 MED ORDER — OZEMPIC (1 MG/DOSE) 2 MG/1.5ML ~~LOC~~ SOPN: 1.0000 mg | PEN_INJECTOR | SUBCUTANEOUS | 4 refills | Status: DC

## 2019-12-12 NOTE — Telephone Encounter (Signed)
Prescription sent to pharmacy.

## 2019-12-13 DIAGNOSIS — R809 Proteinuria, unspecified: Secondary | ICD-10-CM | POA: Diagnosis not present

## 2019-12-13 DIAGNOSIS — I129 Hypertensive chronic kidney disease with stage 1 through stage 4 chronic kidney disease, or unspecified chronic kidney disease: Secondary | ICD-10-CM | POA: Diagnosis not present

## 2019-12-13 DIAGNOSIS — N189 Chronic kidney disease, unspecified: Secondary | ICD-10-CM | POA: Diagnosis not present

## 2019-12-13 DIAGNOSIS — I5032 Chronic diastolic (congestive) heart failure: Secondary | ICD-10-CM | POA: Diagnosis not present

## 2019-12-20 ENCOUNTER — Other Ambulatory Visit: Payer: Self-pay | Admitting: Family

## 2019-12-20 DIAGNOSIS — K219 Gastro-esophageal reflux disease without esophagitis: Secondary | ICD-10-CM

## 2019-12-26 ENCOUNTER — Other Ambulatory Visit: Payer: Self-pay | Admitting: Family

## 2019-12-26 DIAGNOSIS — Z9861 Coronary angioplasty status: Secondary | ICD-10-CM

## 2019-12-26 DIAGNOSIS — I2510 Atherosclerotic heart disease of native coronary artery without angina pectoris: Secondary | ICD-10-CM

## 2019-12-26 DIAGNOSIS — I5030 Unspecified diastolic (congestive) heart failure: Secondary | ICD-10-CM

## 2019-12-28 ENCOUNTER — Telehealth: Payer: Self-pay | Admitting: *Deleted

## 2019-12-28 NOTE — Telephone Encounter (Signed)
Completed phone call for Visit T13,M33 for CLEAR research study. All medication changes have been updated. No AE/SAE to report. Confirmed next appointment scheduled.

## 2019-12-31 NOTE — Progress Notes (Signed)
[facility]    Provider:  Dr Jaynee Eagles Requesting Provider: [name], FNP Primary Care Physician:  [name], FNP    CC: near syncope and syncopal episodes and memory loss  HPI January 01, 2020: Patient here as a new request from [facility] for near syncope.  He has a past medical history of untreated sleep apnea, headaches, chronic kidney disease, peripheral vascular disease, myocardial infarction status post stent July 30, 2016 follows with cardiology and is in a clinical trial for hyperlipidemia, followed by pain management every 2 to 3 months, chronic kidney disease followed by nephrology, followed by vascular and had stent placement in his right leg,, medical noncompliance, hypertension, hyperlipidemia, diabetes, depression, coronary artery disease, arthritis, anxiety, cerebrovascular disease, congestive heart failure, A. fib, diabetic peripheral neuropathy, lacunar infarct, degenerative disc disease, drug-induced myopathy, obesity, former smoker with greater than 30-pack-year history.  He declines to wear CPAP.  We have evaluated him in the past for multiple other disorders including cerebrovascular disease, lacunar strokes, noncompliance with CPAP and untreated obstructive sleep apnea with morning headaches.  I reviewed Dr. Lenna Gilford notes: Patient had near syncope had a steroid injection about 2 weeks prior to appointment and had one episode of blacking out, blurred vision and headache.  He follows with cardiology and was referred back to [place] for near syncope.  He has a statin allergy.  The episode occurred June 12, his vision became dark and he felt weak, in the office his blood pressure was decreased, reviewed recent labs October 30, 2019 included a normal CBC, CMP with creatinine 1.56 and BUN 29, hemoglobin A1c 6.9.   He is here with his wife today who also provides much information, he is having episodes where he passes out, last one was on June 26 he was in the  truck, it was hot, he jumped out of the truck and had to sister and when he straightened up that is when it started, he could hear family asking questions but could not talk, he could not see at all after they got home, he did not go to the hospital. He has had 2 of them, last one was the worst, he has had 2 of them, the first one he was on a golf cart, he got up to come in the house and he made it to the living room, started getting lightheaded, felt like he was going to pass out, laying down can revent the passing out, for a few seconds, vision tunnels in, started wearing and looks pale, feels clammy. Blood pressures have been low. He doesn't drink a lot he has cut back on sodas. He went to cardiology and blood pressure is improved and hasn;t had an episode since June. He doesn't urinate on himself or lose bowel control or have abnormal movements with the syncopal episodes. No post-ictal confusion. He has untreated seep apnea. No episodes since adjusting his blood pressure medications. No focal weakness, speech changes, stroke-like episodes. No other focal neurologic deficits, associated symptoms, inciting events or modifiable factors.During episodes BP has been as low as 80/56. He feel foggy, memory loss. FHx of dementia. Driving, not getting lost, no accidents, still performing all your ADLS IADLs, no personality changes or hallucinations or delusions.   CC:  Headaches, untreated sleep apnea  Interval history 03/15/2019: He wakes up with headaches and goes to sleep with headaches. He has pain pills . It can be anywhere, on either side, both sides, pounding, it gets better during the day.  He wakes up with headaches. No hx of migraines. No significant l ight sensitivity, no sound sensitivity, no nausea or vomiting. They can't travel, the virus has been difficult, he wanted to travel, he is stuck at home. The headache is throbbing. No tearing of the eyes or other autonomic symptoms. Can hurt right behind the  eye. It is most days. It has gotten better lately but can be bad. Also the pollen was bad. He woke up one morning at 4am with a headache, his ear was covered with blood. Headaches associated with pressure in the sinuses and ear.   Reviewed CT Head: 02/20/2019: Stable left basal ganglia lacunar infarct. Mild atrophic changes are noted commenced with the patient's given age. No findings to suggest acute hemorrhage, acute infarction or space-occupying mass lesion are noted.  Vascular: No hyperdense vessel or unexpected calcification.  Skull: Normal. Negative for fracture or focal lesion.  Sinuses/Orbits: No acute finding.  Other: None.  IMPRESSION: Chronic changes as described. No acute intracranial abnormality is noted.   Interval history 03/14/2019: 69 y.o. male here as requested by [name], FNP for intractable headache. PMHx medical noncompliance, hypertension, hyperlipidemia, myocardial infarction, disorders of iron metabolism, diabetes, depression, cellulitis, arthritis, anxiety, coronary artery disease, occlusion and stenosis of vertebral artery, congestive heart failure, A. fib, periodontal disease, diabetic neuropathy. He is seeing my colleague Dr. Brett Fairy for sleep apnea. However despite aoneas and snoring, he states he would never wear a cpap. He has been a many-decade long smoker.   Interval History 03/29/2018: SHARMARKE CICIO is a 69 y.o. male here as a referral from Dr. Lenna Gilford for atherosclerotic disease. PMHx HTN, diabetes, diabetic neuropathy, HLD, depression, obesity, afib. Not smoking. He is morbidly obese. He still snores. Not wearing cpap. He is having a sleep consultation Thursday. Discussed weight loss. Discussed trying to manage his diabetes closely, hga1c 8 (goal is <7),  Discussed weight and weight loss and he declines a referral to the [facility] [place]. Triglycerides too high to calculate LDL, he sees cardiology and is in a study for his  cholesterol discussed ldl goal < 70, untreated sleep apnea. No syncopal episodes since stopping metoprolol. No new events.   reviewed images of CT 01/2018 and agree with the following:  IMPRESSION: 1. No acute intracranial process. 2. Old LEFT basal ganglia lacunar infarct, otherwise negative non-contrast CT HEAD for age.  Interval history 01/13/2017: Patient is here with his wife, he is lovely. He has good days and bad days, he has 1-2 cigarrettes every 2 weeks, he does not drink alcohol. He is on Eliquis and Plavix. Loop recorder identified afib. He has palpitations and yesterday he gets short of breath.His metoprolol was increased. He has shallow breathing and stops breathing at night.    HPI 05/24/2016:  MAKHAI FULCO is a 69 y.o. male here as a referral from Dr. Lenna Gilford for atherosclerotic disease. PMHx HTN, diabetes, diabetic neuropathy, HLD, depression, obesity. Patient is here after admission to the [facility] in early January of this year for visual field changes, blurry vision, Sharp pain behind his right eye and right-sided headache.He was admitted to telemetry for further evaluation. Carotid ultrasound did not show any significant ICA stenosis. MRI of the brain without contrast showed chronic lacunar infarcts but nothing new. Imaging of the vessels showed atheromatous disease. EEG was normal. He did have a complete stroke workup and he is here for follow-up today. He was changed from aspirin to Plavix inpatient.  He  stopped taking the Plavix. He has gone back to taking a baby aspirin. He is trying to manage his diabetes. He is on Trulicity now and working on Mudlogger with pcp. He is taking his glucose multiple times a day and adjusting insulin sliding scale. He tried a statin again and he can't tolerate it and stopped it. He is trying to eat better. He sees Administrator, arts.  He had some right sided sharp neck pain that radiated down the right arm. Since he left the hospital he  can hear his heart beat in his neck radiating to the right ear where the sharp pain was (points to the carotid on the right)  Reviewed notes, labs and imaging from outside physicians, which showed:  HgbA1c 10 05/2016 Triglycerides 647 Cholesterol 239 Cannot calculat LDL  MRI brain:  IMPRESSION: 1. No acute intracranial abnormality. 2. Chronic left basal ganglia lacunar infarct.  MRA head: 1. No visible flow in the left V4 segment. See MRA neck that is reported separately. 2. High-grade atheromatous narrowings at the left ICA anterior genu, bilateral M2 branches, and bilateral P2 branches.  MRA neck : 1. Thready flow within the left vertebral artery which is new compared to 2015 imaging. Suspect a high-grade proximal left subclavian stenosis favoring steal phenomenon. Dissection is the main differential consideration. CTA is recommended to further evaluate. 2. Cervical carotid atherosclerosis with ~ 40% proximal left ICA Stenosis.  CT Angio of the neck: 1. Left V3 segment occlusion. Distal left V4 reconstitution by the basilar with patent left PICA. 2. Diffuse narrowing and luminal irregularity of the left vertebral artery. Correlate for neck pain as dissection or atherosclerosis and underfilling could give this appearance. 3. Mild atherosclerotic narrowing of the left subclavian ostium. No subclavian flow limiting stenosis. 4. Cervical carotid atherosclerosis with 30 to 40 % ICA narrowing on the left.   Review of Systems:  Patient complains of symptoms per HPI as well as the following symptoms: fatigue, heat intolerance, joint pain, dizziness, passing out, headache, numbness, depression . Pertinent negatives and positives per HPI. All others negative   Social History   Socioeconomic History  . Marital status: Married    Spouse name: Not on file  . Number of children: 2  . Years of education: 86  . Highest education level: 12th grade  Occupational History    . Occupation: retired  Tobacco Use  . Smoking status: Former Smoker    Packs/day: 0.25    Years: 51.00    Pack years: 12.75    Types: Cigarettes    Quit date: 08/14/2016    Years since quitting: 3.3  . Smokeless tobacco: Never Used  . Tobacco comment: smokes  a pack a week  Vaping Use  . Vaping Use: Never used  Substance and Sexual Activity  . Alcohol use: No    Alcohol/week: 0.0 standard drinks  . Drug use: No  . Sexual activity: Yes  Other Topics Concern  . Not on file  Social History Narrative   Lives with his wife.  Retired.  Unable to afford expensive medicines.        Caffeine: 1 cup of 1/2 caff coffee in AM, drinks more if at a restaurant    Social Determinants of Health   Financial Resource Strain:   . Difficulty of Paying Living Expenses:   Food Insecurity:   . Worried About Charity fundraiser in the Last Year:   . [place] in the Last Year:  Transportation Needs:   . Film/video editor (Medical):   Marland Kitchen Lack of Transportation (Non-Medical):   Physical Activity:   . Days of Exercise per Week:   . Minutes of Exercise per Session:   Stress:   . Feeling of Stress :   Social Connections:   . Frequency of Communication with Friends and Family:   . Frequency of Social Gatherings with Friends and Family:   . Attends Religious Services:   . Active Member of Clubs or Organizations:   . Attends Archivist Meetings:   Marland Kitchen Marital Status:   Intimate Partner Violence:   . Fear of Current or Ex-Partner:   . Emotionally Abused:   Marland Kitchen Physically Abused:   . Sexually Abused:     Family History  Problem Relation Age of Onset  . Diabetes Father   . Valvular heart disease Father   . Arthritis Father   . Heart disease Father   . Stroke Father   . Alzheimer's disease Mother   . Hyperlipidemia Mother   . Hypertension Mother   . Arthritis Mother   . Lung cancer Mother   . Stroke Mother   . Headache Mother   . Arthritis/Rheumatoid Sister   .  Diabetes Sister   . Hypertension Sister   . Hyperlipidemia Sister   . Depression Sister   . Dementia Maternal Aunt   . Dementia Maternal Uncle   . Heart disease Maternal Uncle   . Stomach cancer Paternal Uncle   . Colon cancer Neg Hx   . Liver disease Neg Hx     Past Medical History:  Diagnosis Date  . Anxiety   . Arthritis   . CAD (coronary artery disease)    a. 2010: DES to CTO of RCA. EF 55% b. 07/2016: cath showing total occlusion within previously placed RCA stent (collaterals present), severe stenosis along LCx and OM1 (treated with 2 overlapping DES). c. repeat cath in 01/2018 showing patent stents along LCx and OM with CTO of D2, CTO of distal LCx, and CTO of RCA with collaterals present overall unchanged since 2018 with medical management recom  . Cellulitis and abscess rt groin   . Depression   . Diabetes mellitus   . Disorders of iron metabolism   . GERD (gastroesophageal reflux disease)   . Hyperlipidemia   . Hypertension   . Low serum testosterone level   . Medically noncompliant   . Myocardial infarction [facility])     Past Surgical History:  Procedure Laterality Date  . ABDOMINAL AORTOGRAM W/LOWER EXTREMITY N/A 03/21/2019   Procedure: ABDOMINAL AORTOGRAM W/LOWER EXTREMITY;  Surgeon: [name], MD;  Location: [place] [facility];  Service: Cardiovascular;  Laterality: N/A;  . BACK SURGERY  2015   ACDF by Dr. [name]  . COLONOSCOPY N/A 10/01/2014   Dr. [name]: multiple tubular adenomas removed, colonic diverticulosis, redundant colon. next tcs advised for 09/2017. PATIENT NEEDS PROPOFOL FOR FAILED CONSCIOUS SEDATION  . CORONARY STENT INTERVENTION N/A 07/30/2016   Procedure: Coronary Stent Intervention;  Surgeon: [name], MD;  Location: [place] [facility];  Service: Cardiovascular;  Laterality: N/A;  . CORONARY STENT PLACEMENT  2000   By Dr. Olevia Perches  . EP IMPLANTABLE DEVICE N/A 05/25/2016   Procedure: Loop Recorder Insertion;  Surgeon: [name], MD;  Location:  [place] [facility];  Service: Cardiovascular;  Laterality: N/A;  . ESOPHAGOGASTRODUODENOSCOPY     esophagus [facility] at [facility]  . ESOPHAGOGASTRODUODENOSCOPY N/A 10/01/2014   Dr. [name]:  patchy mottling/erythema and minimal polypoid appearance of gastric mucosa. bx with mild inlammation but no H.pylori  . HERNIA REPAIR  [id number]   umbilical  . LEFT HEART CATH AND CORONARY ANGIOGRAPHY N/A 07/30/2016   Procedure: Left Heart Cath and Coronary Angiography;  Surgeon: [name], MD;  Location: [place] [facility];  Service: Cardiovascular;  Laterality: N/A;  . LEFT HEART CATH AND CORONARY ANGIOGRAPHY N/A 01/19/2018   Procedure: LEFT HEART CATH AND CORONARY ANGIOGRAPHY;  Surgeon: [name], MD;  Location: [place] [facility];  Service: Cardiovascular;  Laterality: N/A;  . LESION REMOVAL     Lip and hand   . LOWER EXTREMITY ANGIOGRAPHY N/A 04/18/2019   Procedure: LOWER EXTREMITY ANGIOGRAPHY;  Surgeon: [name], MD;  Location: [place] [facility];  Service: Cardiovascular;  Laterality: N/A;  . NECK SURGERY    . PERIPHERAL VASCULAR BALLOON ANGIOPLASTY  04/18/2019   Procedure: PERIPHERAL VASCULAR BALLOON ANGIOPLASTY;  Surgeon: [name], MD;  Location: [place] [facility];  Service: Cardiovascular;;  . PERIPHERAL VASCULAR INTERVENTION Right 03/21/2019   Procedure: PERIPHERAL VASCULAR INTERVENTION;  Surgeon: [name], MD;  Location: [place] [facility];  Service: Cardiovascular;  Laterality: Right;  superficial femoral    Current Outpatient Medications  Medication Sig Dispense Refill  . albuterol (VENTOLIN HFA) 108 (90 Base) MCG/ACT inhaler Inhale 2 puffs into the lungs every 6 (six) hours as needed for wheezing or shortness of breath. 8.5 g 3  . apixaban (ELIQUIS) 5 MG TABS tablet Take 1 tablet (5 mg total) by mouth 2 (two) times daily. 180 tablet 4  . Chlorphen-Phenyleph-ASA (ALKA-SELTZER PLUS COLD PO) Take 2 tablets by mouth daily as needed (allergies).    . clopidogrel  (PLAVIX) 75 MG tablet Take 1 tablet (75 mg total) by mouth daily. 30 tablet 11  . DULoxetine (CYMBALTA) 30 MG capsule Take 1 capsule (30 mg total) by mouth daily. 90 capsule 4  . fenofibrate 160 MG tablet TAKE 1 TABLET BY MOUTH ONCE DAILY FOR CHOLESTEROL AND TRIGLYCERIDES 90 tablet 4  . furosemide (LASIX) 20 MG tablet TAKE 1 TABLET BY MOUTH ONCE DAILY AS NEEDED FOR EDEMA 90 tablet 0  . gabapentin (NEURONTIN) 300 MG capsule TAKE 2 CAPSULES BY MOUTH THREE TIMES DAILY 180 capsule 0  . HUMALOG KWIKPEN 100 UNIT/ML KwikPen INJECT 5 TO 20 UNITS SUBCUTANEOUSLY THREE TIMES DAILY WITH MEALS 15 mL 0  . insulin NPH-regular Human (HUMULIN 70/30) (70-30) 100 UNIT/ML injection Inject 90 Units into the skin 2 (two) times daily with a meal. (Patient taking differently: Inject 70 Units into the skin 2 (two) times daily with a meal. ) 60 mL 12  . isosorbide mononitrate (IMDUR) 30 MG 24 hr tablet Take 1 tablet (30 mg total) by mouth daily. 90 tablet 4  . linaclotide (LINZESS) 72 MCG capsule Take 1 capsule (72 mcg total) by mouth daily as needed (constipation). 90 capsule 3  . Menthol, Topical Analgesic, (BLUE-EMU MAXIMUM STRENGTH EX) Apply 1 application topically daily as needed (back pain).    . metoprolol succinate (TOPROL-XL) 50 MG 24 hr tablet Take 1 tablet (50 mg total) by mouth daily. 30 tablet 11  . nitroGLYCERIN (NITROSTAT) 0.4 MG SL tablet Place 1 tablet (0.4 mg total) under the tongue every 5 (five) minutes x 3 doses as needed for chest pain. 25 tablet 3  . Nutritional Supplements (KETO PO) Take 1,500 mg by mouth.    . pantoprazole (PROTONIX) 40 MG tablet Take 1 tablet by mouth  once daily 90 tablet 1  . Semaglutide, 1 MG/DOSE, (OZEMPIC, 1 MG/DOSE,) 2 MG/1.5ML SOPN Inject 0.75 mLs (1 mg total) into the skin once a week. 12 mL 4  . telmisartan (MICARDIS) 20 MG tablet Take 1 tablet (20 mg total) by mouth daily. 90 tablet 1  . triamcinolone (NASACORT ALLERGY 24HR) 55 MCG/ACT AERO nasal inhaler Place 1 spray into  the nose daily.     . insulin degludec (TRESIBA FLEXTOUCH) 200 UNIT/ML FlexTouch Pen Inject 36 Units into the skin at bedtime. (Patient not taking: Reported on 10/30/2019) 12 pen 2   No current facility-administered medications for this visit.    Allergies as of 01/01/2020 - Review Complete 01/01/2020  Allergen Reaction Noted  . Shellfish allergy Anaphylaxis and Other (See Comments) 09/06/2014  . Sulfa antibiotics Anaphylaxis and Rash 09/06/2014  . Ace inhibitors Other (See Comments) and Cough 08/09/2013  . Invokana [canagliflozin] Other (See Comments) 09/04/2013  . Lisinopril Cough 10/04/2014  . Metformin and related Itching 07/12/2013  . Pravastatin sodium Other (See Comments) 11/14/2014  . Milk-related compounds  04/06/2019  . Crestor [rosuvastatin] Other (See Comments) 06/05/2013  . Fenofibrate Other (See Comments) 11/14/2015  . Horse-derived products Rash 07/25/2008  . Iodine Other (See Comments) 09/07/2014  . Lexapro [escitalopram oxalate] Other (See Comments) 01/13/2016  . Lipitor [atorvastatin] Other (See Comments) 06/05/2013  . Livalo [pitavastatin] Other (See Comments) 06/25/2016  . Repatha [evolocumab] Other (See Comments) 01/04/2017  . Tape Rash 07/25/2008    Vitals: BP 97/61 (BP Location: Right Arm, Patient Position: Sitting, Cuff Size: Large)   Pulse 88   Ht 6\' 2"  (1.88 m)   Wt 300 lb (136.1 kg)   BMI 38.52 kg/m  Last Weight:  Wt Readings from Last 1 Encounters:  01/01/20 300 lb (136.1 kg)   Last Height:   Ht Readings from Last 1 Encounters:  01/01/20 6\' 2"  (1.88 m)    Physical Physical exam: Exam: Gen: NAD, conversant, well nourised, mobidly obese, well groomed                     CV: Irregular, no MRG. No Carotid Bruits. No peripheral edema, warm, nontender Eyes: Conjunctivae clear without exudates or hemorrhage  Neuro: Detailed Neurologic Exam  Speech:    Speech is normal; fluent and spontaneous with normal comprehension.  Cognition:    The  patient is oriented to person, place, and time;     recent and remote memory intact;     language fluent;     normal attention, concentration,     fund of knowledge Cranial Nerves:    The pupils are equal, round, and reactive to light. Attempted fundoscopy could not visualize. Visual fields are full to finger confrontation. Extraocular movements are intact. Trigeminal sensation is intact and the muscles of mastication are normal. The face is symmetric. The palate elevates in the midline. Hearing intact. Voice is normal. Shoulder shrug is normal. The tongue has normal motion without fasciculations.   Coordination:    No dysmetria or ataxia  Gait:    Wide-based due to large body habitus  Motor Observation:    No asymmetry, no atrophy, and no involuntary movements noted. Tone:    Normal muscle tone.    Posture:    Posture is normal. normal erect    Strength:    Strength is V/V in the upper and lower limbs.      Sensation: decreased in a glove and stocking distribution     Reflex Exam:  DTR's:    Absent AJs otherwise deep tendon reflexes in the upper and lower extremities are symmetrical bilaterally.   Toes:    The toes are equiv bilaterally.   Clonus:    Clonus is absent.  Assessment/Plan: Absolutely delightful gentleman with his lovely wife here again for syncopal episodes.  Mr.Vasco A Groganis a 69y.o.malewith history of hypertension, uncontrolled  hyperlipidemia, diabetic neuropathy, ongoing occasional tobacco use(debies usage today), remote strokes, uncontrolled diabetes mellitus(recenty cotrolled), afib on Eliquis,lacunar strokes on plavix, morbid obesity, coronary artery disease, cerebrovascular artery disease, history of medical noncompliance(improved),morbid obesity and untreated sleep apnea(declines a cpap)presenting with syncope an pre-syncope in the setting of hypotension as low as 80/56 improved with management of blood pressure medications last episodes in June.  Today still low BP 97/62  Patient with orthostatic hypotension discussed causes and treatments: discussed (see below). Needs continues follow up with primary care for medication management.   High-grade atheromatous narrowings at the left ICA anterior genu, bilateral M2 branches, and bilateral P2 branches. With diffuse cerebrovascular disease would avoid hypotension goal >409 systolic.  - Memory loss: I don;t appreciate any dementia, but could be multifactorial including medications, morbid obesity, sedentary lifestyle, untreated sleep apnea but can check a CT of the head (he declines MRI)  - He has untreated sleep apnea and declines being treated, We spent an extended amount of time discussing it again today. He has said in the past he will accept the consequences of his untreated sleep apnea and morbid obesity and lifestyle and I told him these may be some of the consequences but not all, increased risk of stroke and dementia and others  - Discussed weight loss again. Declines referral to [facility] [place] again today  - manage diabetes closely, hga1c  goal is <7),   -  cholesterol discussed ldl goal < 70, on a clinical trial for his cholesterol - Cervical carotid atherosclerosis with 30 to 40 % ICA narrowing on the left in 2018. Recommend follow up carotid ultrasound.  - continue plavix and eliquis for stroke prevention - Afib: continue eliquis   Stroke an dvascular risk factors: I had a long d/w patient about his stroke risk, risk for recurrent stroke/TIAs, personally independently reviewed imaging studies and stroke evaluation results and answered questions.Continue Plavix and Eliquis for secondary stroke prevention and maintain strict control of hypertension with blood pressure goal below 130/90, diabetes with hemoglobin A1c goal below 6.5% and lipids with LDL cholesterol goal below 70 mg/dL I also advised the patient to eat a healthy diet with plenty of whole grains, cereals,  fruits and vegetables, exercise regularly and maintain ideal body weight .Followup in the future with me in 6 months or call earlier if necessary. With diffuse cerebrovascular disease would avoid hypotension. Discussed smoking cessation and obesity. Highly encouraged him to manage his vascular risk factors which are extensive. He is in a trial for his hyperlipidemia.   Discussed: Non-Drug Treatment for Low Blood Pressure on Standing: Orthostatic hypotension: 1. Changing Postures: . Change posture slowly when getting up, especially in the morning . Hold on to something during the first few minutes after standing up. Do not start walking as soon as you get up from the chair . Avoid prolonged recumbency or lying down . Raise the head of the bed by 10 to 20 degrees  2. Exercise: . Perform Isotonic exercise, e.g.recumbent bike, pedaling movements while sitting in a chair . Avoid exercises where you have to strain   3. Avoid  Pooling of blood in legs: . Wear custom-fitted elastic stockings. The ones which extend to the abdomen work even better. Consider wearing an abdominal binder. . Perform physical counter-maneuvers, such as crossing legs and tensing leg muscles.  4. Eating and Drinking: . Small meals are recommended. Avoid large meals. Avoid standing suddenly after a large meal . Avoid alcohol . Increase intake of fluids and regular salt. A daily intake of up to 10 grams of sodium per day and a fluid intake of 2.0 to 2.5 liters per day (8 to 10 glasses of water) is recommended.  . Rapid (over 3 minutes) ingestion of approximately 0.5 liter (2 glasses of water) of tap water, raises blood pressure within 5 to 15 minutes and lasts for an hour.  5. Other Tips: . Avoid hot baths. Instead take warm baths . Maintain a BP record standing and lying down . If you are only any BP lowering drugs (antihypertensives, diuretics, antidepressants, drugs for prostate, etc) ask your doctor to revisit the need to  keep you on these drugs . If all non-drug therapy fails, ask your doctor about drug therapy   Follow-up with primary care physician.    Sarina Ill, MD  [facility] [address] [place], [place] [address]  Phone [phone] Fax [phone]  I spent more than 60 minutes of face-to-face and non-face-to-face time with patient on the  1. Memory loss   2. Orthostatic hypotension   3. Morbid obesity ([place])   4. Morning headache   5. Lacunar infarction ([place])   6. Neuropathy   7. Witnessed apneic spells   8. Paroxysmal atrial fibrillation ([facility])   9. Hyperlipidemia associated with type 2 diabetes mellitus ([place])   10. Diabetic mononeuropathy associated with diabetes mellitus due to underlying condition ([place])   11. Stenosis of carotid artery, unspecified laterality    diagnosis.  This included previsit chart review, lab review, study review, order entry, electronic health record documentation, patient education on the different diagnostic and therapeutic options, counseling and coordination of care, risks and benefits of management, compliance, or risk factor reduction

## 2020-01-01 ENCOUNTER — Ambulatory Visit: Payer: Medicare Other | Admitting: Neurology

## 2020-01-01 ENCOUNTER — Encounter: Payer: Self-pay | Admitting: Neurology

## 2020-01-01 VITALS — BP 97/61 | HR 88 | Ht 74.0 in | Wt 300.0 lb

## 2020-01-01 DIAGNOSIS — R0681 Apnea, not elsewhere classified: Secondary | ICD-10-CM

## 2020-01-01 DIAGNOSIS — E785 Hyperlipidemia, unspecified: Secondary | ICD-10-CM

## 2020-01-01 DIAGNOSIS — R413 Other amnesia: Secondary | ICD-10-CM

## 2020-01-01 DIAGNOSIS — R519 Headache, unspecified: Secondary | ICD-10-CM

## 2020-01-01 DIAGNOSIS — I6529 Occlusion and stenosis of unspecified carotid artery: Secondary | ICD-10-CM

## 2020-01-01 DIAGNOSIS — I951 Orthostatic hypotension: Secondary | ICD-10-CM | POA: Diagnosis not present

## 2020-01-01 DIAGNOSIS — I6381 Other cerebral infarction due to occlusion or stenosis of small artery: Secondary | ICD-10-CM

## 2020-01-01 DIAGNOSIS — E0841 Diabetes mellitus due to underlying condition with diabetic mononeuropathy: Secondary | ICD-10-CM

## 2020-01-01 DIAGNOSIS — G629 Polyneuropathy, unspecified: Secondary | ICD-10-CM

## 2020-01-01 DIAGNOSIS — I480 Paroxysmal atrial fibrillation: Secondary | ICD-10-CM

## 2020-01-01 DIAGNOSIS — E1169 Type 2 diabetes mellitus with other specified complication: Secondary | ICD-10-CM

## 2020-01-01 NOTE — Patient Instructions (Addendum)
Need to adjust BP, still with hypotension and orthostatic symptoms, With diffuse cerebrovascular disease would avoid hypotension  Continue Eliquis and Plavix  CT of the head  Non-Drug Treatment for Low Blood Pressure on Standing:  1. Changing Postures: . Change posture slowly when getting up, especially in the morning . Hold on to something during the first few minutes after standing up. Do not start walking as soon as you get up from the chair . Avoid prolonged recumbency or lying down . Raise the head of the bed by 10 to 20 degrees  2. Exercise: . Perform Isotonic exercise, e.g.recumbent bike, pedaling movements while sitting in a chair . Avoid exercises where you have to strain   3. Avoid Pooling of blood in legs: . Wear custom-fitted elastic stockings. The ones which extend to the abdomen work even better. Consider wearing an abdominal binder. . Perform physical counter-maneuvers, such as crossing legs and tensing leg muscles.  4. Eating and Drinking: . Small meals are recommended. Avoid large meals. Avoid standing suddenly after a large meal . Avoid alcohol . Increase intake of fluids  . Rapid (over 3 minutes) ingestion of approximately 0.5 liter (2 glasses of water) of tap water, raises blood pressure within 5 to 15 minutes and lasts for an hour.  5. Other Tips: . Avoid hot baths. Instead take warm baths . Maintain a BP record standing and lying down . If you are only any BP lowering drugs (antihypertensives, diuretics, antidepressants, drugs for prostate, etc) ask your doctor to revisit the need to keep you on these drugs . If all non-drug therapy fails, ask your doctor about drug therapy       Orthostatic Hypotension Blood pressure is a measurement of how strongly, or weakly, your blood is pressing against the walls of your arteries. Orthostatic hypotension is a sudden drop in blood pressure that happens when you quickly change positions, such as when you get up from  sitting or lying down. Arteries are blood vessels that carry blood from your heart throughout your body. When blood pressure is too low, you may not get enough blood to your brain or to the rest of your organs. This can cause weakness, light-headedness, rapid heartbeat, and fainting. This can last for just a few seconds or for up to a few minutes. Orthostatic hypotension is usually not a serious problem. However, if it happens frequently or gets worse, it may be a sign of something more serious. What are the causes? This condition may be caused by:  Sudden changes in posture, such as standing up quickly after you have been sitting or lying down.  Blood loss.  Loss of body fluids (dehydration).  Heart problems.  Hormone (endocrine) problems.  Pregnancy.  Severe infection.  Lack of certain nutrients.  Severe allergic reactions (anaphylaxis).  Certain medicines, such as blood pressure medicine or medicines that make the body lose excess fluids (diuretics). Sometimes, this condition can be caused by not taking medicine as directed, such as taking too much of a certain medicine. What increases the risk? The following factors may make you more likely to develop this condition:  Age. Risk increases as you get older.  Conditions that affect the heart or the central nervous system.  Taking certain medicines, such as blood pressure medicine or diuretics.  Being pregnant. What are the signs or symptoms? Symptoms of this condition may include:  Weakness.  Light-headedness.  Dizziness.  Blurred vision.  Fatigue.  Rapid heartbeat.  Fainting, in severe cases.  How is this diagnosed? This condition is diagnosed based on:  Your medical history.  Your symptoms.  Your blood pressure measurement. Your health care provider will check your blood pressure when you are: ? Lying down. ? Sitting. ? Standing. A blood pressure reading is recorded as two numbers, such as "120 over 80"  (or 120/80). The first ("top") number is called the systolic pressure. It is a measure of the pressure in your arteries as your heart beats. The second ("bottom") number is called the diastolic pressure. It is a measure of the pressure in your arteries when your heart relaxes between beats. Blood pressure is measured in a unit called mm Hg. Healthy blood pressure for most adults is 120/80. If your blood pressure is below 90/60, you may be diagnosed with hypotension. Other information or tests that may be used to diagnose orthostatic hypotension include:  Your other vital signs, such as your heart rate and temperature.  Blood tests.  Tilt table test. For this test, you will be safely secured to a table that moves you from a lying position to an upright position. Your heart rhythm and blood pressure will be monitored during the test. How is this treated? This condition may be treated by:  Changing your diet. This may involve eating more salt (sodium) or drinking more water.  Taking medicines to raise your blood pressure.  Changing the dosage of certain medicines you are taking that might be lowering your blood pressure.  Wearing compression stockings. These stockings help to prevent blood clots and reduce swelling in your legs. In some cases, you may need to go to the hospital for:  Fluid replacement. This means you will receive fluids through an IV.  Blood replacement. This means you will receive donated blood through an IV (transfusion).  Treating an infection or heart problems, if this applies.  Monitoring. You may need to be monitored while medicines that you are taking wear off. Follow these instructions at home: Eating and drinking   Drink enough fluid to keep your urine pale yellow.  Eat a healthy diet, and follow instructions from your health care provider about eating or drinking restrictions. A healthy diet includes: ? Fresh fruits and vegetables. ? Whole grains. ? Lean  meats. ? Low-fat dairy products.  Eat extra salt only as directed. Do not add extra salt to your diet unless your health care provider told you to do that.  Eat frequent, small meals.  Avoid standing up suddenly after eating. Medicines  Take over-the-counter and prescription medicines only as told by your health care provider. ? Follow instructions from your health care provider about changing the dosage of your current medicines, if this applies. ? Do not stop or adjust any of your medicines on your own. General instructions   Wear compression stockings as told by your health care provider.  Get up slowly from lying down or sitting positions. This gives your blood pressure a chance to adjust.  Avoid hot showers and excessive heat as directed by your health care provider.  Return to your normal activities as told by your health care provider. Ask your health care provider what activities are safe for you.  Do not use any products that contain nicotine or tobacco, such as cigarettes, e-cigarettes, and chewing tobacco. If you need help quitting, ask your health care provider.  Keep all follow-up visits as told by your health care provider. This is important. Contact a health care provider if you:  Vomit.  Have  diarrhea.  Have a fever for more than 2-3 days.  Feel more thirsty than usual.  Feel weak and tired. Get help right away if you:  Have chest pain.  Have a fast or irregular heartbeat.  Develop numbness in any part of your body.  Cannot move your arms or your legs.  Have trouble speaking.  Become sweaty or feel light-headed.  Faint.  Feel short of breath.  Have trouble staying awake.  Feel confused. Summary  Orthostatic hypotension is a sudden drop in blood pressure that happens when you quickly change positions.  Orthostatic hypotension is usually not a serious problem.  It is diagnosed by having your blood pressure taken lying down, sitting, and  then standing.  It may be treated by changing your diet or adjusting your medicines. This information is not intended to replace advice given to you by your health care provider. Make sure you discuss any questions you have with your health care provider. Document Revised: 10/28/2017 Document Reviewed: 10/28/2017 Elsevier Patient Education  [place].

## 2020-01-02 ENCOUNTER — Telehealth: Payer: Self-pay | Admitting: Neurology

## 2020-01-02 NOTE — Telephone Encounter (Signed)
[employer] medicare order sent to [facility]. No auth they will reach out to the patient to schedule.  

## 2020-01-03 ENCOUNTER — Inpatient Hospital Stay (HOSPITAL_COMMUNITY): Admission: RE | Admit: 2020-01-03 | Payer: Medicare Other | Source: Ambulatory Visit

## 2020-01-04 ENCOUNTER — Telehealth: Payer: Self-pay | Admitting: Neurology

## 2020-01-04 LAB — BASIC METABOLIC PANEL
BUN/Creatinine Ratio: 18 (ref 10–24)
BUN: 31 mg/dL — ABNORMAL HIGH (ref 8–27)
CO2: 26 mmol/L (ref 20–29)
Calcium: 10.5 mg/dL — ABNORMAL HIGH (ref 8.6–10.2)
Chloride: 97 mmol/L (ref 96–106)
Creatinine, Ser: 1.71 mg/dL — ABNORMAL HIGH (ref 0.76–1.27)
GFR calc Af Amer: 46 mL/min/{1.73_m2} — ABNORMAL LOW (ref >59)
GFR calc non Af Amer: 40 mL/min/{1.73_m2} — ABNORMAL LOW (ref >59)
Glucose: 178 mg/dL — ABNORMAL HIGH (ref 65–99)
Potassium: 5.1 mmol/L (ref 3.5–5.2)
Sodium: 136 mmol/L (ref 134–144)

## 2020-01-04 LAB — METHYLMALONIC ACID, SERUM: Methylmalonic Acid: 391 nmol/L — ABNORMAL HIGH (ref 0–378)

## 2020-01-04 LAB — HOMOCYSTEINE: Homocysteine: 28.4 umol/L — ABNORMAL HIGH (ref 0.0–17.2)

## 2020-01-04 LAB — VITAMIN B1: Thiamine: 146.2 nmol/L (ref 66.5–200.0)

## 2020-01-04 LAB — TSH: TSH: 1.91 u[IU]/mL (ref 0.450–4.500)

## 2020-01-04 LAB — B12 AND FOLATE PANEL
Folate: 8.8 ng/mL (ref >3.0)
Vitamin B-12: 367 pg/mL (ref 232–1245)

## 2020-01-04 NOTE — Telephone Encounter (Signed)
[place], please let patient know that Patient has B12 deficiency. Even though his B12 was 367, a percentage of patients under 400 level will still have B12 deficiency as seen here by his elevated MMA and elevated homocysteine.  this can cause memory problems plus many other problems. 1000-2023mcg B12 daily po long term. recheck B12 and homocysteine in 8 weeks and follow with pcp see phone note [facility])

## 2020-01-05 ENCOUNTER — Ambulatory Visit (INDEPENDENT_AMBULATORY_CARE_PROVIDER_SITE_OTHER): Payer: Medicare Other

## 2020-01-05 ENCOUNTER — Ambulatory Visit
Admission: RE | Admit: 2020-01-05 | Discharge: 2020-01-05 | Disposition: A | Discharge status: Home / Self Care | Payer: Medicare Other | Source: Ambulatory Visit | Attending: Neurology | Admitting: Neurology

## 2020-01-05 DIAGNOSIS — R519 Headache, unspecified: Secondary | ICD-10-CM

## 2020-01-05 DIAGNOSIS — R413 Other amnesia: Secondary | ICD-10-CM

## 2020-01-05 DIAGNOSIS — Z0000 Encounter for general adult medical examination without abnormal findings: Secondary | ICD-10-CM | POA: Diagnosis not present

## 2020-01-05 DIAGNOSIS — I6381 Other cerebral infarction due to occlusion or stenosis of small artery: Secondary | ICD-10-CM

## 2020-01-05 DIAGNOSIS — R0681 Apnea, not elsewhere classified: Secondary | ICD-10-CM

## 2020-01-05 NOTE — Progress Notes (Addendum)
MEDICARE ANNUAL WELLNESS VISIT  01/05/2020  Telephone Visit Disclaimer This Medicare AWV was conducted by telephone due to national recommendations for restrictions regarding the COVID-19 Pandemic (e.g. social distancing).  I verified, using two identifiers, that I am speaking with [name] [name] [name] or their authorized healthcare agent. I discussed the limitations, risks, security, and privacy concerns of performing an evaluation and management service by telephone and the potential availability of an in-person appointment in the future. The patient expressed understanding and agreed to proceed.   Subjective:  [name] [name] [name] is [name] [name] 69 y.o. male patient of [name] [name], Theador Hawthorne, FNP who had [name] [name] Medicare Annual Wellness Visit today via telephone. [name] [name] [name] is Retired and lives with their spouse. He has two children, two grandchildren and one great-grandchild.  He reports that he is socially active and does interact with friends/family regularly. He is minimally physically active and enjoys fishing, loves to listen to music and sing, and painting.  Patient Care Team: [name], FNP as PCP - General (Family Medicine) Harl Bowie Alphonse Guild, MD as PCP - Cardiology (Cardiology) [name], MD as PCP - Electrophysiology (Cardiology) Rodolph Bong, MD (Inactive) as Attending Physician (Cardiology) Glenna Fellows, MD as Attending Physician (Neurosurgery) [name], MD as Consulting Physician (Vascular Surgery) Carolan Clines, MD (Inactive) as Consulting Physician (Urology) [name] Cristopher Estimable, MD as Consulting Physician (Gastroenterology) Dorene Ar, MD as Consulting Physician (Pain Medicine) Melvenia Beam, MD as Consulting Physician (Neurology)  Advanced Directives 01/05/2020 06/05/2019 04/18/2019 03/21/2019 03/06/2019 01/04/2019 01/18/2018  Does Patient Have [name] [name] Medical Advance Directive? Yes No Yes No No Yes Yes  Type of Paramedic of [place];Living will - [place];Living will Living will;Healthcare Power of Attorney  Does patient want to make changes to medical advance directive? No - Patient declined - No - Patient declined - - No - Patient declined No - Patient declined  Copy of [place] in Chart? No - copy requested - No - copy requested - - No - copy requested No - copy requested  Would patient like information on creating [name] [name] medical advance directive? - No - Patient declined No - Patient declined No - Patient declined No - Patient declined - -    Hospital Utilization Over the Past 12 Months: # of hospitalizations or ER visits: 0 # of surgeries: 1  Review of Systems    Patient reports that his overall health is worse compared to last year.  History obtained from chart review and the patient  Patient Reported Readings (BP, Pulse, CBG, Weight, etc) none  Pain Assessment Pain : 0-10 Pain Score: 4  Pain Location: Leg Pain Orientation: Lower, Right, Left Pain Descriptors / Indicators: Aching Pain Onset: More than [name] [name] month ago Pain Frequency: Several days [name] [name] week Pain Relieving Factors: Blue Emu, diabetic socks Effect of Pain on Daily Activities: none  Pain Relieving Factors: Blue Emu, diabetic socks  Current Medications & Allergies (verified) Allergies as of 01/05/2020       Reactions   Shellfish Allergy Anaphylaxis, Other (See Comments)   Tongue swelling, hives   Sulfa Antibiotics Anaphylaxis, Rash   Tongue swelling, hives   Ace Inhibitors Other (See Comments), Cough   CKD, renal failure    Invokana [canagliflozin] Other (See Comments)   Syncope / dehydration   Lisinopril Cough   Metformin And Related Itching   Pravastatin Sodium Other (See Comments)   myalgias  Milk-related Compounds    Ties stomach in knots    Crestor [rosuvastatin] Other (See Comments)   Myalgias   Fenofibrate Other (See Comments)   Body aches - pt currently taking isnt sure if its causing any  pain   Horse-derived Products Rash   Iodine Other (See Comments)   ????. PATIENT SAYS HE DID FINE WITH LAST CT WITH CONTRAST** patient had IV contrast on 06/21/2018 without pre meds without any issues. Patient had allergic reaction to Shellfish 30 years ago-not to IV contrast.    Lexapro [escitalopram Oxalate] Other (See Comments)   Buzzing in ears,headache, felt like [name] [name] zombie   Lipitor [atorvastatin] Other (See Comments)   myalgias   Livalo [pitavastatin] Other (See Comments)   Myalgias   Repatha [evolocumab] Other (See Comments)   Myalgias, flu like sx   Tape Rash        Medication List        Accurate as of January 05, 2020  8:30 AM. If you have any questions, ask your nurse or doctor.          albuterol 108 (90 Base) MCG/ACT inhaler Commonly known as: VENTOLIN HFA Inhale 2 puffs into the lungs every 6 (six) hours as needed for wheezing or shortness of breath.   ALKA-SELTZER PLUS COLD PO Take 2 tablets by mouth daily as needed (allergies).   apixaban 5 MG Tabs tablet Commonly known as: Eliquis Take 1 tablet (5 mg total) by mouth 2 (two) times daily.   BLUE-EMU MAXIMUM STRENGTH EX Apply 1 application topically daily as needed (back pain).   clopidogrel 75 MG tablet Commonly known as: Plavix Take 1 tablet (75 mg total) by mouth daily.   DULoxetine 30 MG capsule Commonly known as: CYMBALTA Take 1 capsule (30 mg total) by mouth daily.   fenofibrate 160 MG tablet TAKE 1 TABLET BY MOUTH ONCE DAILY FOR CHOLESTEROL AND TRIGLYCERIDES   furosemide 20 MG tablet Commonly known as: LASIX TAKE 1 TABLET BY MOUTH ONCE DAILY AS NEEDED FOR EDEMA   gabapentin 300 MG capsule Commonly known as: NEURONTIN TAKE 2 CAPSULES BY MOUTH THREE TIMES DAILY What changed:  how much to take how to take this when to take this   HumaLOG KwikPen 100 UNIT/ML KwikPen Generic drug: insulin lispro INJECT 5 TO 20 UNITS SUBCUTANEOUSLY THREE TIMES DAILY WITH MEALS   insulin NPH-regular Human  (70-30) 100 UNIT/ML injection Commonly known as: HumuLIN 70/30 Inject 90 Units into the skin 2 (two) times daily with [name] [name] meal. What changed: how much to take   isosorbide mononitrate 30 MG 24 hr tablet Commonly known as: IMDUR Take 1 tablet (30 mg total) by mouth daily.   KETO PO Take 1,500 mg by mouth.   linaclotide 72 MCG capsule Commonly known as: Linzess Take 1 capsule (72 mcg total) by mouth daily as needed (constipation).   metoprolol succinate 50 MG 24 hr tablet Commonly known as: TOPROL-XL Take 1 tablet (50 mg total) by mouth daily.   Nasacort Allergy 24HR 55 MCG/ACT Aero nasal inhaler Generic drug: triamcinolone Place 1 spray into the nose daily.   nitroGLYCERIN 0.4 MG SL tablet Commonly known as: NITROSTAT Place 1 tablet (0.4 mg total) under the tongue every 5 (five) minutes x 3 doses as needed for chest pain.   Ozempic (1 MG/DOSE) 2 MG/1.5ML Sopn Generic drug: Semaglutide (1 MG/DOSE) Inject 0.75 mLs (1 mg total) into the skin once [name] [name] week.   pantoprazole 40 MG tablet Commonly known as: PROTONIX Take 1  tablet by mouth once daily   telmisartan 20 MG tablet Commonly known as: MICARDIS Take 1 tablet (20 mg total) by mouth daily.   Tyler Aas FlexTouch 200 UNIT/ML FlexTouch Pen Generic drug: insulin degludec Inject 36 Units into the skin at bedtime.        History (reviewed): Past Medical History:  Diagnosis Date   Anxiety    Arthritis    CAD (coronary artery disease)    [name] [name]. 2010: DES to CTO of RCA. EF 55% b. 07/2016: cath showing total occlusion within previously placed RCA stent (collaterals present), severe stenosis along LCx and OM1 (treated with 2 overlapping DES). c. repeat cath in 01/2018 showing patent stents along LCx and OM with CTO of D2, CTO of distal LCx, and CTO of RCA with collaterals present overall unchanged since 2018 with medical management recom   Cellulitis and abscess rt groin    Depression    Diabetes mellitus    Disorders of iron  metabolism    GERD (gastroesophageal reflux disease)    Hyperlipidemia    Hypertension    Low serum testosterone level    Medically noncompliant    Myocardial infarction [facility])    Past Surgical History:  Procedure Laterality Date   ABDOMINAL AORTOGRAM W/LOWER EXTREMITY N/[name] [name] 03/21/2019   Procedure: ABDOMINAL AORTOGRAM W/LOWER EXTREMITY;  Surgeon: [name], MD;  Location: [place] [facility];  Service: Cardiovascular;  Laterality: N/[name] [name];   BACK SURGERY  2015   ACDF by Dr. [name]   COLONOSCOPY N/[name] [name] 10/01/2014   Dr. [name]: multiple tubular adenomas removed, colonic diverticulosis, redundant colon. next tcs advised for 09/2017. PATIENT NEEDS PROPOFOL FOR FAILED CONSCIOUS SEDATION   CORONARY STENT INTERVENTION N/[name] [name] 07/30/2016   Procedure: Coronary Stent Intervention;  Surgeon: [name], MD;  Location: [place] [facility];  Service: Cardiovascular;  Laterality: N/[name] [name];   CORONARY STENT PLACEMENT  2000   By Dr. Olevia Perches   EP IMPLANTABLE DEVICE N/[name] [name] 05/25/2016   Procedure: Loop Recorder Insertion;  Surgeon: [name], MD;  Location: [place] [facility];  Service: Cardiovascular;  Laterality: N/[name] [name];   ESOPHAGOGASTRODUODENOSCOPY     esophagus stretched remotely at [facility]   ESOPHAGOGASTRODUODENOSCOPY N/[name] [name] 10/01/2014   Dr. [name]: patchy mottling/erythema and minimal polypoid appearance of gastric mucosa. bx with mild inlammation but no H.pylori   HERNIA REPAIR  1610   umbilical   LEFT HEART CATH AND CORONARY ANGIOGRAPHY N/[name] [name] 07/30/2016   Procedure: Left Heart Cath and Coronary Angiography;  Surgeon: [name], MD;  Location: [place] [facility];  Service: Cardiovascular;  Laterality: N/[name] [name];   LEFT HEART CATH AND CORONARY ANGIOGRAPHY N/[name] [name] 01/19/2018   Procedure: LEFT HEART CATH AND CORONARY ANGIOGRAPHY;  Surgeon: [name], MD;  Location: [place] [facility];  Service: Cardiovascular;  Laterality: N/[name] [name];   LESION REMOVAL     Lip and hand    LOWER EXTREMITY ANGIOGRAPHY N/[name] [name] 04/18/2019   Procedure: LOWER  EXTREMITY ANGIOGRAPHY;  Surgeon: [name], MD;  Location: [place] [facility];  Service: Cardiovascular;  Laterality: N/[name] [name];   NECK SURGERY     PERIPHERAL VASCULAR BALLOON ANGIOPLASTY  04/18/2019   Procedure: PERIPHERAL VASCULAR BALLOON ANGIOPLASTY;  Surgeon: [name], MD;  Location: [place] [facility];  Service: Cardiovascular;;   PERIPHERAL VASCULAR INTERVENTION Right 03/21/2019   Procedure: PERIPHERAL VASCULAR INTERVENTION;  Surgeon: [name], MD;  Location: [place] [facility];  Service: Cardiovascular;  Laterality: Right;  superficial femoral   Family History  Problem Relation Age of  Onset   Diabetes Father    Valvular heart disease Father    Arthritis Father    Heart disease Father    Stroke Father    Alzheimer's disease Mother    Hyperlipidemia Mother    Hypertension Mother    Arthritis Mother    Lung cancer Mother    Stroke Mother    Headache Mother    Arthritis/Rheumatoid Sister    Diabetes Sister    Hypertension Sister    Hyperlipidemia Sister    Depression Sister    Dementia Maternal Aunt    Dementia Maternal Uncle    Heart disease Maternal Uncle    Stomach cancer Paternal Uncle    Colon cancer Neg Hx    Liver disease Neg Hx    Social History   Socioeconomic History   Marital status: Married    Spouse name: Not on file   Number of children: 2   Years of education: 12   Highest education level: 12th grade  Occupational History   Occupation: retired  Tobacco Use   Smoking status: Former Smoker    Packs/day: 0.25    Years: 51.00    Pack years: 12.75    Types: Cigarettes    Quit date: 08/14/2016    Years since quitting: 3.3   Smokeless tobacco: Never Used   Tobacco comment: smokes  [name] [name] pack [name] [name] week  Vaping Use   Vaping Use: Never used  Substance and Sexual Activity   Alcohol use: No    Alcohol/week: 0.0 standard drinks   Drug use: No   Sexual activity: Yes  Other Topics Concern   Not on file  Social History Narrative   Lives with his  wife.  Retired.  Unable to afford expensive medicines.        Caffeine: 1 cup of 1/2 caff coffee in AM, drinks more if at [name] [name] restaurant    Social Determinants of Health   Financial Resource Strain:    Difficulty of Paying Living Expenses: Not on file  Food Insecurity:    Worried About Charity fundraiser in the Last Year: Not on file   [employer] of Food in the Last Year: Not on file  Transportation Needs:    Lack of Transportation (Medical): Not on file   Lack of Transportation (Non-Medical): Not on file  Physical Activity:    Days of Exercise per Week: Not on file   Minutes of Exercise per Session: Not on file  Stress:    Feeling of Stress : Not on file  Social Connections:    Frequency of Communication with Friends and Family: Not on file   Frequency of Social Gatherings with Friends and Family: Not on file   Attends Religious Services: Not on file   Active Member of Clubs or Organizations: Not on file   Attends Archivist Meetings: Not on file   Marital Status: Not on file    Activities of Daily Living In your present state of health, do you have any difficulty performing the following activities: 01/05/2020  Hearing? N  Vision? N  Difficulty concentrating or making decisions? Y  Comment has noticed some short term memory loss  Walking or climbing stairs? Y  Comment difficulty climbing stairs  Dressing or bathing? N  Doing errands, shopping? N  Preparing Food and eating ? N  Using the Toilet? N  In the past six months, have you accidently leaked urine? N  Do you have problems with loss of bowel  control? N  Managing your Medications? N  Managing your Finances? N  Housekeeping or managing your Housekeeping? N  Some recent data might be hidden   Patient reports he has noticed some short term memory loss.  He also has pain in his legs and knees when climbing stairs.  Patient Education/ Literacy How often do you need to have someone help you when you read  instructions, pamphlets, or other written materials from your doctor or pharmacy?: 1 - Never What is the last grade level you completed in school?: 12th grade  Exercise Current Exercise Habits: The patient does not participate in regular exercise at present, Exercise limited by: orthopedic condition(s)  Diet Patient reports consuming 3 meals [name] [name] day and 0 snack(s) [name] [name] day Patient reports that his primary diet is: Regular Patient reports that she does have regular access to food.   Depression Screen PHQ 2/9 Scores 01/05/2020 10/30/2019 10/30/2019 09/05/2019 07/26/2019 07/26/2019 04/27/2019  PHQ - 2 Score 0 0 0 0 0 0 0  PHQ- 9 Score - 0 - - 0 - 0     Fall Risk Fall Risk  01/05/2020 10/30/2019 09/05/2019 07/26/2019 04/27/2019  Falls in the past year? 1 1 0 0 -  Number falls in past yr: 1 - 0 - -  Injury with Fall? 0 1 0 - -  Risk Factor Category  - - - - -  Risk for fall due to : History of fall(s) History of fall(s) No Fall Risks - (No Data)  Risk for fall due to: Comment - - - - Obesity  Follow up Falls evaluation completed Education provided Falls evaluation completed - -     Objective:  [name] [name] [name] seemed alert and oriented and he participated appropriately during our telephone visit.  Blood Pressure Weight BMI  BP Readings from Last 3 Encounters:  01/01/20 97/61  11/16/19 131/67  10/30/19 (!) 100/57   Wt Readings from Last 3 Encounters:  01/01/20 300 lb (136.1 kg)  11/16/19 295 lb 3.2 oz (133.9 kg)  10/30/19 297 lb 3.2 oz (134.8 kg)   BMI Readings from Last 1 Encounters:  01/01/20 38.52 kg/m    *Unable to obtain current vital signs, weight, and BMI due to telephone visit type  Hearing/Vision  [name] [name] [name] did not seem to have difficulty with hearing/understanding during the telephone conversation Reports that he has had [name] [name] formal eye exam by an eye care professional within the past year Reports that he has not had [name] [name] formal hearing evaluation within the past year *Unable to fully assess  hearing and vision during telephone visit type  Cognitive Function: 6CIT Screen 01/05/2020 01/04/2019  What Year? 0 points 0 points  What month? 0 points 0 points  What time? 0 points 0 points  Count back from 20 - 0 points  Months in reverse 0 points 0 points  Repeat phrase 0 points 2 points  Total Score - 2   (Normal:0-7, Significant for Dysfunction: >8)  Normal Cognitive Function Screening: Yes   Immunization & Health Maintenance Record Immunization History  Administered Date(s) Administered   Influenza,inj,Quad PF,6+ Mos 02/26/2016   Moderna SARS-COVID-2 Vaccination 07/20/2019, 08/22/2019   Tdap 09/17/2015    Health Maintenance  Topic Date Due   INFLUENZA VACCINE  12/17/2019   PNA vac Low Risk Adult (1 of 2 - PCV13) 09/29/2026 (Originally 10/12/2015)   FOOT EXAM  04/26/2020   OPHTHALMOLOGY EXAM  04/28/2020   HEMOGLOBIN A1C  04/30/2020   COLONOSCOPY  09/30/2024   TETANUS/TDAP  09/16/2025   COVID-19 Vaccine  Completed   Hepatitis C Screening  Completed       Assessment  This is [name] [name] routine wellness examination for [name] [name] [name].  Health Maintenance: Due or Overdue Health Maintenance Due  Topic Date Due   INFLUENZA VACCINE  12/17/2019    [name] [name] [name] does not need [name] [name] referral for Community Assistance: Care Management:   no Social Work:    no Prescription Assistance:  no Nutrition/Diabetes Education:  no   Plan:  Personalized Goals Goals Addressed             This Visit's Progress    Patient Stated       01/05/2020 AWV Goal: Exercise for General Health  Patient will verbalize understanding of the benefits of increased physical activity: Exercising regularly is important. It will improve your overall fitness, flexibility, and endurance. Regular exercise also will improve your overall health. It can help you control your weight, reduce stress, and improve your bone density. Over the next year, patient will increase physical activity as tolerated with [name] [name] goal  of at least 150 minutes of moderate physical activity per week.  You can tell that you are exercising at [name] [name] moderate intensity if your heart starts beating faster and you start breathing faster but can still hold [name] [name] conversation. Moderate-intensity exercise ideas include: Walking 1 mile (1.6 km) in about 15 minutes Biking Hiking Golfing Dancing Water aerobics Patient will verbalize understanding of everyday activities that increase physical activity by providing examples like the following: Yard work, such as: Sales promotion account executive Gardening Washing windows or floors Patient will be able to explain general safety guidelines for exercising:  Before you start [name] [name] new exercise program, talk with your health care provider. Do not exercise so much that you hurt yourself, feel dizzy, or get very short of breath. Wear comfortable clothes and wear shoes with good support. Drink plenty of water while you exercise to prevent dehydration or heat stroke. Work out until your breathing and your heartbeat get faster.        Personalized Health Maintenance & Screening Recommendations  Pneumococcal vaccine   Lung Cancer Screening Recommended: no (Low Dose CT Chest recommended if Age 86-80 years, 30 pack-year currently smoking OR have quit w/in past 15 years) Hepatitis C Screening recommended: no HIV Screening recommended: no  Advanced Directives: Written information was not prepared per patient's request.  Referrals & Orders No orders of the defined types were placed in this encounter.   Follow-up Plan Follow-up with [name], FNP as planned     I have personally reviewed and noted the following in the patient's chart:   Medical and social history Use of alcohol, tobacco or illicit drugs  Current medications and supplements Functional ability and status Nutritional status Physical activity Advanced  directives List of other physicians Hospitalizations, surgeries, and ER visits in previous 12 months Vitals Screenings to include cognitive, depression, and falls Referrals and appointments  In addition, I have reviewed and discussed with [name] [name] [name] certain preventive protocols, quality metrics, and best practice recommendations. [name] [name] written personalized care plan for preventive services as well as general preventive health recommendations is available and can be mailed to the patient at his request.      Felicity Coyer, LPN    1/82/9937  After visit summary was not printed per patient request.   I have reviewed the CCM documentation and  agree with the written assessment and plan of care.  [name], FNP

## 2020-01-08 NOTE — Telephone Encounter (Signed)
Melvenia Beam, MD  01/07/2020  7:29 PM EDT Back to Top    CT of the brain unremarkable thanks

## 2020-01-08 NOTE — Telephone Encounter (Addendum)
I spoke with patient. We discussed the results of the labs and the CT head. The patient verbalized understanding. He is close to his primary care office so he will have his B12 and Homocysteine redrawn there in 8 weeks. He also stated he had a couple more "dizzy/bright light spells". He also stated that a few times he had the spells it was right after he had eaten. He was given information from Dr Jaynee Eagles regarding orthostatic hypotension and tips. I also encouraged him to make a follow up appointment with primary care to discuss medication management as he should avoid hypotension. The pt's questions were answered. He verbalized appreciation for the call.

## 2020-01-10 ENCOUNTER — Other Ambulatory Visit: Payer: Self-pay | Admitting: Student

## 2020-01-10 ENCOUNTER — Other Ambulatory Visit: Payer: Self-pay | Admitting: Family

## 2020-01-11 ENCOUNTER — Ambulatory Visit (HOSPITAL_COMMUNITY)
Admission: RE | Admit: 2020-01-11 | Discharge: 2020-01-11 | Disposition: A | Discharge status: Home / Self Care | Payer: Medicare Other | Source: Ambulatory Visit | Attending: Neurology | Admitting: Neurology

## 2020-01-11 ENCOUNTER — Other Ambulatory Visit: Payer: Self-pay

## 2020-01-11 ENCOUNTER — Other Ambulatory Visit: Payer: Self-pay | Admitting: *Deleted

## 2020-01-11 DIAGNOSIS — I739 Peripheral vascular disease, unspecified: Secondary | ICD-10-CM | POA: Diagnosis not present

## 2020-01-11 DIAGNOSIS — I6523 Occlusion and stenosis of bilateral carotid arteries: Secondary | ICD-10-CM | POA: Diagnosis not present

## 2020-01-17 ENCOUNTER — Other Ambulatory Visit: Payer: Self-pay | Admitting: Student

## 2020-02-08 ENCOUNTER — Telehealth: Payer: Self-pay | Admitting: *Deleted

## 2020-02-08 NOTE — Telephone Encounter (Signed)
   [facility] Pre-operative Risk Assessment    HEARTCARE STAFF: - Please ensure there is not already an duplicate clearance open for this procedure. - Under Visit Info/Reason for Call, type in Other and utilize the format Clearance MM/DD/YY or Clearance TBD. Do not use dashes or single digits. - If request is for dental extraction, please clarify the # of teeth to be extracted.  Request for surgical clearance:  1. What type of surgery is being performed? LEFT L4-S1 NRB  2. When is this surgery scheduled? 02/19/20  3. What type of clearance is required (medical clearance vs. Pharmacy clearance to hold med vs. Both)? MEDICAL  4. Are there any medications that need to be held prior to surgery and how long? PLAVIX x 7 DAYS PRIOR  5. Practice name and name of physician performing surgery? [facility]; DR. DAVID O'TOOLE  6. What is the office phone number? [phone]   7.   What is the office fax number? [phone]  8.   Anesthesia type (None, local, MAC, general) ? NOT LISTED   Julaine Hua 02/08/2020, 12:26 PM  _________________________________________________________________   (provider comments below)

## 2020-02-08 NOTE — Telephone Encounter (Signed)
Called pt and scheduled appt 02/28/2020 @ 8:15 AM w/ Truitt Merle, NP. Unfortunately this is the first available. Will forward to requesting providers office.

## 2020-02-08 NOTE — Telephone Encounter (Signed)
Follow Up:; ° ° °Returning your call. °

## 2020-02-08 NOTE — Telephone Encounter (Signed)
Primary Cardiologist:Branch, Roderic Palau, MD  Chart reviewed as part of pre-operative protocol coverage. Because of [name] [name]'s past medical history and time since last visit, he/she will require a follow-up visit in order to better assess preoperative cardiovascular risk.  Pre-op covering staff: - Please schedule appointment and call patient to inform them. - Please contact requesting surgeon's office via preferred method (i.e, phone, fax) to inform them of need for appointment prior to surgery.  If applicable, this message will also be routed to pharmacy pool and/or primary cardiologist for input on holding anticoagulant/antiplatelet agent as requested below so that this information is available at time of patient's appointment.   Deberah Pelton, NP  02/08/2020, 12:54 PM

## 2020-02-13 ENCOUNTER — Other Ambulatory Visit: Payer: Self-pay | Admitting: Family

## 2020-02-14 ENCOUNTER — Other Ambulatory Visit: Payer: Self-pay | Admitting: Family Medicine

## 2020-02-14 ENCOUNTER — Other Ambulatory Visit: Payer: Self-pay

## 2020-02-14 ENCOUNTER — Other Ambulatory Visit: Payer: Medicare Other

## 2020-02-14 DIAGNOSIS — Z794 Long term (current) use of insulin: Secondary | ICD-10-CM | POA: Diagnosis not present

## 2020-02-14 DIAGNOSIS — E1142 Type 2 diabetes mellitus with diabetic polyneuropathy: Secondary | ICD-10-CM

## 2020-02-14 DIAGNOSIS — E1129 Type 2 diabetes mellitus with other diabetic kidney complication: Secondary | ICD-10-CM | POA: Diagnosis not present

## 2020-02-14 DIAGNOSIS — I10 Essential (primary) hypertension: Secondary | ICD-10-CM | POA: Diagnosis not present

## 2020-02-14 DIAGNOSIS — E1159 Type 2 diabetes mellitus with other circulatory complications: Secondary | ICD-10-CM

## 2020-02-14 DIAGNOSIS — R809 Proteinuria, unspecified: Secondary | ICD-10-CM | POA: Diagnosis not present

## 2020-02-14 DIAGNOSIS — I129 Hypertensive chronic kidney disease with stage 1 through stage 4 chronic kidney disease, or unspecified chronic kidney disease: Secondary | ICD-10-CM | POA: Diagnosis not present

## 2020-02-14 DIAGNOSIS — N189 Chronic kidney disease, unspecified: Secondary | ICD-10-CM | POA: Diagnosis not present

## 2020-02-14 DIAGNOSIS — E1122 Type 2 diabetes mellitus with diabetic chronic kidney disease: Secondary | ICD-10-CM | POA: Diagnosis not present

## 2020-02-14 LAB — BAYER DCA HB A1C WAIVED: HB A1C (BAYER DCA - WAIVED): 7.4 % — ABNORMAL HIGH (ref <7.0)

## 2020-02-15 ENCOUNTER — Encounter: Payer: Self-pay | Admitting: Family

## 2020-02-15 ENCOUNTER — Ambulatory Visit (INDEPENDENT_AMBULATORY_CARE_PROVIDER_SITE_OTHER): Payer: Medicare Other | Admitting: Family

## 2020-02-15 VITALS — BP 112/70 | HR 82 | Temp 96.7°F | Ht 74.0 in | Wt 300.6 lb

## 2020-02-15 DIAGNOSIS — F331 Major depressive disorder, recurrent, moderate: Secondary | ICD-10-CM

## 2020-02-15 DIAGNOSIS — Z794 Long term (current) use of insulin: Secondary | ICD-10-CM

## 2020-02-15 DIAGNOSIS — F419 Anxiety disorder, unspecified: Secondary | ICD-10-CM

## 2020-02-15 DIAGNOSIS — E1159 Type 2 diabetes mellitus with other circulatory complications: Secondary | ICD-10-CM | POA: Diagnosis not present

## 2020-02-15 DIAGNOSIS — G720 Drug-induced myopathy: Secondary | ICD-10-CM

## 2020-02-15 DIAGNOSIS — I10 Essential (primary) hypertension: Secondary | ICD-10-CM

## 2020-02-15 DIAGNOSIS — I2510 Atherosclerotic heart disease of native coronary artery without angina pectoris: Secondary | ICD-10-CM

## 2020-02-15 DIAGNOSIS — I5030 Unspecified diastolic (congestive) heart failure: Secondary | ICD-10-CM

## 2020-02-15 DIAGNOSIS — E1169 Type 2 diabetes mellitus with other specified complication: Secondary | ICD-10-CM

## 2020-02-15 DIAGNOSIS — I152 Hypertension secondary to endocrine disorders: Secondary | ICD-10-CM

## 2020-02-15 DIAGNOSIS — E1142 Type 2 diabetes mellitus with diabetic polyneuropathy: Secondary | ICD-10-CM

## 2020-02-15 DIAGNOSIS — E785 Hyperlipidemia, unspecified: Secondary | ICD-10-CM

## 2020-02-15 DIAGNOSIS — I480 Paroxysmal atrial fibrillation: Secondary | ICD-10-CM | POA: Diagnosis not present

## 2020-02-15 DIAGNOSIS — K219 Gastro-esophageal reflux disease without esophagitis: Secondary | ICD-10-CM

## 2020-02-15 DIAGNOSIS — Z9861 Coronary angioplasty status: Secondary | ICD-10-CM

## 2020-02-15 DIAGNOSIS — E0841 Diabetes mellitus due to underlying condition with diabetic mononeuropathy: Secondary | ICD-10-CM

## 2020-02-15 LAB — CMP14+EGFR
ALT: 19 IU/L (ref 0–44)
AST: 27 IU/L (ref 0–40)
Albumin/Globulin Ratio: 1.6 (ref 1.2–2.2)
Albumin: 4.7 g/dL (ref 3.8–4.8)
Alkaline Phosphatase: 72 IU/L (ref 44–121)
BUN/Creatinine Ratio: 17 (ref 10–24)
BUN: 24 mg/dL (ref 8–27)
Bilirubin Total: 0.6 mg/dL (ref 0.0–1.2)
CO2: 25 mmol/L (ref 20–29)
Calcium: 10.2 mg/dL (ref 8.6–10.2)
Chloride: 94 mmol/L — ABNORMAL LOW (ref 96–106)
Creatinine, Ser: 1.4 mg/dL — ABNORMAL HIGH (ref 0.76–1.27)
GFR calc Af Amer: 59 mL/min/{1.73_m2} — ABNORMAL LOW (ref >59)
GFR calc non Af Amer: 51 mL/min/{1.73_m2} — ABNORMAL LOW (ref >59)
Globulin, Total: 3 g/dL (ref 1.5–4.5)
Glucose: 205 mg/dL — ABNORMAL HIGH (ref 65–99)
Potassium: 5.1 mmol/L (ref 3.5–5.2)
Sodium: 136 mmol/L (ref 134–144)
Total Protein: 7.7 g/dL (ref 6.0–8.5)

## 2020-02-15 LAB — CBC WITH DIFFERENTIAL/PLATELET
Basophils Absolute: 0.1 10*3/uL (ref 0.0–0.2)
Basos: 1 %
EOS (ABSOLUTE): 0.1 10*3/uL (ref 0.0–0.4)
Eos: 1 %
Hematocrit: 39.4 % (ref 37.5–51.0)
Hemoglobin: 13.2 g/dL (ref 13.0–17.7)
Immature Grans (Abs): 0.1 10*3/uL (ref 0.0–0.1)
Immature Granulocytes: 1 %
Lymphocytes Absolute: 1.8 10*3/uL (ref 0.7–3.1)
Lymphs: 21 %
MCH: 29.1 pg (ref 26.6–33.0)
MCHC: 33.5 g/dL (ref 31.5–35.7)
MCV: 87 fL (ref 79–97)
Monocytes Absolute: 0.7 10*3/uL (ref 0.1–0.9)
Monocytes: 8 %
Neutrophils Absolute: 5.7 10*3/uL (ref 1.4–7.0)
Neutrophils: 68 %
Platelets: 340 10*3/uL (ref 150–450)
RBC: 4.53 x10E6/uL (ref 4.14–5.80)
RDW: 12.6 % (ref 11.6–15.4)
WBC: 8.5 10*3/uL (ref 3.4–10.8)

## 2020-02-15 LAB — LIPID PANEL
Chol/HDL Ratio: 15.5 ratio — ABNORMAL HIGH (ref 0.0–5.0)
Cholesterol, Total: 232 mg/dL — ABNORMAL HIGH (ref 100–199)
HDL: 15 mg/dL — ABNORMAL LOW (ref >39)
LDL Chol Calc (NIH): 108 mg/dL — ABNORMAL HIGH (ref 0–99)
Triglycerides: 621 mg/dL (ref 0–149)
VLDL Cholesterol Cal: 109 mg/dL — ABNORMAL HIGH (ref 5–40)

## 2020-02-15 NOTE — Patient Instructions (Addendum)
Diabetes Mellitus and Nutrition, Adult When you have diabetes (diabetes mellitus), it is very important to have healthy eating habits because your blood sugar (glucose) levels are greatly affected by what you eat and drink. Eating healthy foods in the appropriate amounts, at about the same times every day, can help you:  Control your blood glucose.  Lower your risk of heart disease.  Improve your blood pressure.  Reach or maintain a healthy weight. Every person with diabetes is different, and each person has different needs for a meal plan. Your health care provider may recommend that you work with a diet and nutrition specialist (dietitian) to make a meal plan that is best for you. Your meal plan may vary depending on factors such as:  The calories you need.  The medicines you take.  Your weight.  Your blood glucose, blood pressure, and cholesterol levels.  Your activity level.  Other health conditions you have, such as heart or kidney disease. How do carbohydrates affect me? Carbohydrates, also called carbs, affect your blood glucose level more than any other type of food. Eating carbs naturally raises the amount of glucose in your blood. Carb counting is a method for keeping track of how many carbs you eat. Counting carbs is important to keep your blood glucose at a healthy level, especially if you use insulin or take certain oral diabetes medicines. It is important to know how many carbs you can safely have in each meal. This is different for every person. Your dietitian can help you calculate how many carbs you should have at each meal and for each snack. Foods that contain carbs include:  Bread, cereal, rice, pasta, and crackers.  Potatoes and corn.  Peas, beans, and lentils.  Milk and yogurt.  Fruit and juice.  Desserts, such as cakes, cookies, ice cream, and candy. How does alcohol affect me? Alcohol can cause a sudden decrease in blood glucose (hypoglycemia),  especially if you use insulin or take certain oral diabetes medicines. Hypoglycemia can be a life-threatening condition. Symptoms of hypoglycemia (sleepiness, dizziness, and confusion) are similar to symptoms of having too much alcohol. If your health care provider says that alcohol is safe for you, follow these guidelines:  Limit alcohol intake to no more than 1 drink per day for nonpregnant women and 2 drinks per day for men. One drink equals 12 oz of beer, 5 oz of wine, or 1 oz of hard liquor.  Do not drink on an empty stomach.  Keep yourself hydrated with water, diet soda, or unsweetened iced tea.  Keep in mind that regular soda, juice, and other mixers may contain a lot of sugar and must be counted as carbs. What are tips for following this plan?  Reading food labels  Start by checking the serving size on the "Nutrition Facts" label of packaged foods and drinks. The amount of calories, carbs, fats, and other nutrients listed on the label is based on one serving of the item. Many items contain more than one serving per package.  Check the total grams (g) of carbs in one serving. You can calculate the number of servings of carbs in one serving by dividing the total carbs by 15. For example, if a food has 30 g of total carbs, it would be equal to 2 servings of carbs.  Check the number of grams (g) of saturated and trans fats in one serving. Choose foods that have low or no amount of these fats.  Check the number of   milligrams (mg) of salt (sodium) in one serving. Most people should limit total sodium intake to less than 2,300 mg per day.  Always check the nutrition information of foods labeled as "low-fat" or "nonfat". These foods may be higher in added sugar or refined carbs and should be avoided.  Talk to your dietitian to identify your daily goals for nutrients listed on the label. Shopping  Avoid buying canned, premade, or processed foods. These foods tend to be high in fat, sodium,  and added sugar.  Shop around the outside edge of the grocery store. This includes fresh fruits and vegetables, bulk grains, fresh meats, and fresh dairy. Cooking  Use low-heat cooking methods, such as baking, instead of high-heat cooking methods like deep frying.  Cook using healthy oils, such as olive, canola, or sunflower oil.  Avoid cooking with butter, cream, or high-fat meats. Meal planning  Eat meals and snacks regularly, preferably at the same times every day. Avoid going long periods of time without eating.  Eat foods high in fiber, such as fresh fruits, vegetables, beans, and whole grains. Talk to your dietitian about how many servings of carbs you can eat at each meal.  Eat 4-6 ounces (oz) of lean protein each day, such as lean meat, chicken, fish, eggs, or tofu. One oz of lean protein is equal to: ? 1 oz of meat, chicken, or fish. ? 1 egg. ?  cup of tofu.  Eat some foods each day that contain healthy fats, such as avocado, nuts, seeds, and fish. Lifestyle  Check your blood glucose regularly.  Exercise regularly as told by your health care provider. This may include: ? 150 minutes of moderate-intensity or vigorous-intensity exercise each week. This could be brisk walking, biking, or water aerobics. ? Stretching and doing strength exercises, such as yoga or weightlifting, at least 2 times a week.  Take medicines as told by your health care provider.  Do not use any products that contain nicotine or tobacco, such as cigarettes and e-cigarettes. If you need help quitting, ask your health care provider.  Work with a Social worker or diabetes educator to identify strategies to manage stress and any emotional and social challenges. Questions to ask a health care provider  Do I need to meet with a diabetes educator?  Do I need to meet with a dietitian?  What number can I call if I have questions?  When are the best times to check my blood glucose? Where to find more  information:  American Diabetes Association: diabetes.org  Academy of Nutrition and Dietetics: www.eatright.[employer] of Diabetes and Digestive and Kidney Diseases (NIH): [employer] Summary  A healthy meal plan will help you control your blood glucose and maintain a healthy lifestyle.  Working with a diet and nutrition specialist (dietitian) can help you make a meal plan that is best for you.  Keep in mind that carbohydrates (carbs) and alcohol have immediate effects on your blood glucose levels. It is important to count carbs and to use alcohol carefully. This information is not intended to replace advice given to you by your health care provider. Make sure you discuss any questions you have with your health care provider. Document Revised: 04/16/2017 Document Reviewed: 06/08/2016 Elsevier Patient Education  [place].  High Triglycerides Eating Plan Triglycerides are a type of fat in the blood. High levels of triglycerides can increase your risk of heart disease and stroke. If your triglyceride levels are high, choosing the right foods can help  lower your triglycerides and keep your heart healthy. Work with your health care provider or a diet and nutrition specialist (dietitian) to develop an eating plan that is right for you. What are tips for following this plan? General guidelines   Lose weight, if you are overweight. For most people, losing 5-10 lbs (2-5 kg) helps lower triglyceride levels. A weight-loss plan may include. ? 30 minutes of exercise at least 5 days a week. ? Reducing the amount of calories, sugar, and fat you eat.  Eat a wide variety of fresh fruits, vegetables, and whole grains. These foods are high in fiber.  Eat foods that contain healthy fats, such as fatty fish, nuts, seeds, and olive oil.  Avoid foods that are high in added sugar, added salt (sodium), saturated fat, and trans fat.  Avoid low-fiber, refined carbohydrates such as  white bread, crackers, noodles, and white rice.  Avoid foods with partially hydrogenated oils (trans fats), such as fried foods or stick margarine.  Limit alcohol intake to no more than 1 drink a day for nonpregnant women and 2 drinks a day for men. One drink equals 12 oz of beer, 5 oz of wine, or 1 oz of hard liquor. Your health care provider may recommend that you drink less depending on your overall health. Reading food labels  Check food labels for the amount of saturated fat. Choose foods with no or very little saturated fat.  Check food labels for the amount of trans fat. Choose foods with no trans fat.  Check food labels for the amount of cholesterol. Choose foods low in cholesterol. Ask your dietitian how much cholesterol you should have each day.  Check food labels for the amount of sodium. Choose foods with less than 140 milligrams (mg) per serving. Shopping  Buy dairy products labeled as nonfat (skim) or low-fat (1%).  Avoid buying processed or prepackaged foods. These are often high in added sugar, sodium, and fat. Cooking  Choose healthy fats when cooking, such as olive oil or canola oil.  Cook foods using lower fat methods, such as baking, broiling, boiling, or grilling.  Make your own sauces, dressings, and marinades when possible, instead of buying them. Store-bought sauces, dressings, and marinades are often high in sodium and sugar. Meal planning  Eat more home-cooked food and less restaurant, buffet, and fast food.  Eat fatty fish at least 2 times each week. Examples of fatty fish include salmon, trout, mackerel, tuna, and herring.  If you eat whole eggs, do not eat more than 3 egg yolks per week. What foods are recommended? The items listed may not be a complete list. Talk with your dietitian about what dietary choices are best for you. Grains Whole wheat or whole grain breads, crackers, cereals, and pasta. Unsweetened oatmeal. Bulgur. Barley. Quinoa. Brown  rice. Whole wheat flour tortillas. Vegetables Fresh or frozen vegetables. Low-sodium canned vegetables. Fruits All fresh, canned (in natural juice), or frozen fruits. Meats and other protein foods Skinless chicken or [place]. Ground chicken or [place]. Lean cuts of pork, trimmed of fat. Fish and seafood, especially salmon, trout, and herring. Egg whites. Dried beans, peas, or lentils. Unsalted nuts or seeds. Unsalted canned beans. Natural peanut or almond butter. Dairy Low-fat dairy products. Skim or low-fat (1%) milk. Reduced fat (2%) and low-sodium cheese. Low-fat ricotta cheese. Low-fat cottage cheese. Plain, low-fat yogurt. Fats and oils Tub margarine without trans fats. Light or reduced-fat mayonnaise. Light or reduced-fat salad dressings. Avocado. Safflower, olive, sunflower, soybean, and canola  oils. What foods are not recommended? The items listed may not be a complete list. Talk with your dietitian about what dietary choices are best for you. Grains White bread. White (regular) pasta. White rice. Cornbread. Bagels. Pastries. Crackers that contain trans fat. Vegetables Creamed or fried vegetables. Vegetables in a cheese sauce. Fruits Sweetened dried fruit. Canned fruit in syrup. Fruit juice. Meats and other protein foods Fatty cuts of meat. Ribs. Chicken wings. Berniece Salines. Sausage. Bologna. Salami. Chitterlings. Fatback. Hot dogs. Bratwurst. Packaged lunch meats. Dairy Whole or reduced-fat (2%) milk. Half-and-half. Cream cheese. Full-fat or sweetened yogurt. Full-fat cheese. Nondairy creamers. Whipped toppings. Processed cheese or cheese spreads. Cheese curds. Beverages Alcohol. Sweetened drinks, such as soda, lemonade, fruit drinks, or punches. Fats and oils Butter. Stick margarine. Lard. Shortening. Ghee. Bacon fat. Tropical oils, such as coconut, palm kernel, or palm oils. Sweets and desserts Corn syrup. Sugars. Honey. Molasses. Candy. Jam and jelly. Syrup. Sweetened cereals. Cookies.  Pies. Cakes. Donuts. Muffins. Ice cream. Condiments Store-bought sauces, dressings, and marinades that are high in sugar, such as ketchup and barbecue sauce. Summary  High levels of triglycerides can increase the risk of heart disease and stroke. Choosing the right foods can help lower your triglycerides.  Eat plenty of fresh fruits, vegetables, and whole grains. Choose low-fat dairy and lean meats. Eat fatty fish at least twice a week.  Avoid processed and prepackaged foods with added sugar, sodium, saturated fat, and trans fat.  If you need suggestions or have questions about what types of food are good for you, talk with your health care provider or a dietitian. This information is not intended to replace advice given to you by your health care provider. Make sure you discuss any questions you have with your health care provider. Document Revised: 04/16/2017 Document Reviewed: 07/07/2016 Elsevier Patient Education  2020 [employer].

## 2020-02-15 NOTE — Progress Notes (Signed)
Subjective:    Patient ID: [name] [name], male    DOB: 1950-10-06, 69 y.o.   MRN: [id number]  Chief Complaint  Patient presents with  . Medical Management of Chronic Issues   PT presents to the office today for chronic follow up. PT had NSTEMI on 07/30/16 and stent placed. Pt is followed by Cardiologistsevery 4 months. Pt currently in a clinical trial for hyperlipidemia.He is followed by Ortho for osteoarthritis and is currently getting injections in bilateral knees.Pt is followed by Pain Management every 2-3 months.Followed by Nephrologists every 3 months for CKD.   He is followed by Vascular and had stent placed in his right leg. Diabetes He presents for his follow-up diabetic visit. He has type 2 diabetes mellitus. There are no hypoglycemic associated symptoms. Associated symptoms include blurred vision and foot paresthesias. Symptoms are stable. Diabetic complications include heart disease, nephropathy and peripheral neuropathy. Risk factors for coronary artery disease include dyslipidemia, diabetes mellitus, male sex, hypertension and sedentary lifestyle. He is following a generally unhealthy diet. His overall blood glucose range is 140-180 mg/dl. An ACE inhibitor/angiotensin II receptor blocker is being taken. Eye exam current: has an appt in 03/2020.  Hypertension This is a chronic problem. The current episode started more than 1 year ago. The problem has been resolved since onset. The problem is controlled. Associated symptoms include blurred vision and malaise/fatigue. Pertinent negatives include no shortness of breath. Risk factors for coronary artery disease include dyslipidemia, diabetes mellitus, obesity, male gender and sedentary lifestyle. The current treatment provides moderate improvement. Hypertensive end-organ damage includes kidney disease, CAD/MI and heart failure.  Gastroesophageal Reflux He complains of belching, coughing and heartburn. This is a chronic problem. The  current episode started more than 1 year ago. The problem occurs occasionally. The problem has been waxing and waning. Risk factors include obesity. He has tried a PPI for the symptoms. The treatment provided moderate relief.  Constipation This is a chronic problem. The current episode started more than 1 year ago. The problem has been resolved since onset. Risk factors include obesity. He has tried laxatives for the symptoms. The treatment provided moderate relief.      Review of Systems  Constitutional: Positive for malaise/fatigue.  Eyes: Positive for blurred vision.  Respiratory: Positive for cough. Negative for shortness of breath.   Gastrointestinal: Positive for constipation and heartburn.  All other systems reviewed and are negative.      Objective:   Physical Exam Vitals reviewed.  Constitutional:      General: He is not in acute distress.    Appearance: He is well-developed. He is obese.  HENT:     Head: Normocephalic.     Right Ear: Tympanic membrane normal.     Left Ear: Tympanic membrane normal.  Eyes:     General:        Right eye: No discharge.        Left eye: No discharge.     Pupils: Pupils are equal, round, and reactive to light.  Neck:     Thyroid: No thyromegaly.  Cardiovascular:     Rate and Rhythm: Normal rate and regular rhythm.     Heart sounds: Murmur heard.   Pulmonary:     Effort: Pulmonary effort is normal. No respiratory distress.     Breath sounds: Normal breath sounds. No wheezing.  Abdominal:     General: Bowel sounds are normal. There is no distension.     Palpations: Abdomen is soft.  Tenderness: There is no abdominal tenderness.  Musculoskeletal:        General: No tenderness. Normal range of motion.     Cervical back: Normal range of motion and neck supple.  Skin:    General: Skin is warm and dry.     Findings: Lesion (vascular ulcer on lateral left ankle, scattered scabbed lesions on bilateral lower extremity ) present. No  erythema or rash.  Neurological:     Mental Status: He is alert and oriented to person, place, and time.     Cranial Nerves: No cranial nerve deficit.     Deep Tendon Reflexes: Reflexes are normal and symmetric.  Psychiatric:        Behavior: Behavior normal.        Thought Content: Thought content normal.        Judgment: Judgment normal.          BP 112/70   Pulse 82   Temp (!) 96.7 F (35.9 C)   Ht 6\' 2"  (1.88 m)   Wt (!) 300 lb 9.6 oz (136.4 kg)   SpO2 99%   BMI 38.59 kg/m   Assessment & Plan:  [name] [name] comes in today with chief complaint of Medical Management of Chronic Issues   Diagnosis and orders addressed:  1. Paroxysmal atrial fibrillation (HCC)  2. CAD S/P percutaneous coronary angioplasty  3. Diastolic congestive heart failure, unspecified HF chronicity ([place])  4. Hypertension associated with diabetes ([place])  5. Gastroesophageal reflux disease without esophagitis  6. Type 2 diabetes mellitus with diabetic polyneuropathy, with long-term current use of insulin ([place])  7. Diabetic mononeuropathy associated with diabetes mellitus due to underlying condition ([place])  8. Hyperlipidemia associated with type 2 diabetes mellitus ([place])  9. Drug-induced myopathy  10. Anxiety  11. Morbid obesity ([place])  12. Moderate episode of recurrent major depressive disorder ([place])   Labs discussed he had drawn yesterday, he will make appt with Clinical Pharm for diabetic  and cholesterol education. 4 Keep all follow up with Specialists  Health Maintenance reviewed Diet and exercise encouraged  Follow up plan: 3 months    [name], FNP

## 2020-02-16 DIAGNOSIS — R809 Proteinuria, unspecified: Secondary | ICD-10-CM | POA: Diagnosis not present

## 2020-02-16 DIAGNOSIS — I5032 Chronic diastolic (congestive) heart failure: Secondary | ICD-10-CM | POA: Diagnosis not present

## 2020-02-16 DIAGNOSIS — N189 Chronic kidney disease, unspecified: Secondary | ICD-10-CM | POA: Diagnosis not present

## 2020-02-16 DIAGNOSIS — I129 Hypertensive chronic kidney disease with stage 1 through stage 4 chronic kidney disease, or unspecified chronic kidney disease: Secondary | ICD-10-CM | POA: Diagnosis not present

## 2020-02-18 NOTE — Progress Notes (Deleted)
CARDIOLOGY OFFICE NOTE  Date:  02/18/2020    [name] Date of Birth: May 08, 1951 Medical Record #[id number]  PCP:  [name], FNP  Cardiologist:  [name]  No chief complaint on file.   History of Present Illness: ZARIF RATHJE is a 69 y.o. male who presents today for a pre op clearance visit. Former patient of Dr. Harl Bowie who switched back to Dr. [name]. Also seen by Dr. Lovena Le.   He has a  history of known CAD (s/p DES to RCA in 2010, cath in 07/2016 showing CTO of RCA with collaterals present and DES x2 to LCx and OM1), PAF (on Eliquis), HTN, HLD, IDDM, and prior CVA. He has known PAD and sees Dr. [name] at [facility] - has had stenting of right SFA in 2020. Has total occlusion of left SFA and not able to revascularize and not felt to have severe enough symptoms to warrant left fem-pop bypass - managed medically. Has had PAF noted on ILR. He is anticoagulated with Eliquis.   Last seen by Dr. [name] in April. Noted more dyspnea - echo was obtained.   Comes in today. Here with   Past Medical History:  Diagnosis Date  . Anxiety   . Arthritis   . CAD (coronary artery disease)    a. 2010: DES to CTO of RCA. EF 55% b. 07/2016: cath showing total occlusion within previously placed RCA stent (collaterals present), severe stenosis along LCx and OM1 (treated with 2 overlapping DES). c. repeat cath in 01/2018 showing patent stents along LCx and OM with CTO of D2, CTO of distal LCx, and CTO of RCA with collaterals present overall unchanged since 2018 with medical management recom  . Cellulitis and abscess rt groin   . Depression   . Diabetes mellitus   . Disorders of iron metabolism   . GERD (gastroesophageal reflux disease)   . Hyperlipidemia   . Hypertension   . Low serum testosterone level   . Medically noncompliant   . Myocardial infarction [facility])     Past Surgical History:  Procedure Laterality Date  . ABDOMINAL AORTOGRAM W/LOWER EXTREMITY N/A 03/21/2019   Procedure:  ABDOMINAL AORTOGRAM W/LOWER EXTREMITY;  Surgeon: [name], MD;  Location: [place] [facility];  Service: Cardiovascular;  Laterality: N/A;  . BACK SURGERY  2015   ACDF by Dr. [name]  . COLONOSCOPY N/A 10/01/2014   Dr. [name]: multiple tubular adenomas removed, colonic diverticulosis, redundant colon. next tcs advised for 09/2017. PATIENT NEEDS PROPOFOL FOR FAILED CONSCIOUS SEDATION  . CORONARY STENT INTERVENTION N/A 07/30/2016   Procedure: Coronary Stent Intervention;  Surgeon: [name], MD;  Location: [place] [facility];  Service: Cardiovascular;  Laterality: N/A;  . CORONARY STENT PLACEMENT  2000   By Dr. Olevia Perches  . EP IMPLANTABLE DEVICE N/A 05/25/2016   Procedure: Loop Recorder Insertion;  Surgeon: [name], MD;  Location: [place] [facility];  Service: Cardiovascular;  Laterality: N/A;  . ESOPHAGOGASTRODUODENOSCOPY     esophagus stretched remotely at [facility]  . ESOPHAGOGASTRODUODENOSCOPY N/A 10/01/2014   Dr. [name]: patchy mottling/erythema and minimal polypoid appearance of gastric mucosa. bx with mild inlammation but no H.pylori  . HERNIA REPAIR  [id number]   umbilical  . LEFT HEART CATH AND CORONARY ANGIOGRAPHY N/A 07/30/2016   Procedure: Left Heart Cath and Coronary Angiography;  Surgeon: [name], MD;  Location: [place] [facility];  Service: Cardiovascular;  Laterality: N/A;  . LEFT HEART CATH AND CORONARY ANGIOGRAPHY N/A  01/19/2018   Procedure: LEFT HEART CATH AND CORONARY ANGIOGRAPHY;  Surgeon: [name], MD;  Location: [place] [facility];  Service: Cardiovascular;  Laterality: N/A;  . LESION REMOVAL     Lip and hand   . LOWER EXTREMITY ANGIOGRAPHY N/A 04/18/2019   Procedure: LOWER EXTREMITY ANGIOGRAPHY;  Surgeon: [name], MD;  Location: [place] [facility];  Service: Cardiovascular;  Laterality: N/A;  . NECK SURGERY    . PERIPHERAL VASCULAR BALLOON ANGIOPLASTY  04/18/2019   Procedure: PERIPHERAL VASCULAR BALLOON ANGIOPLASTY;  Surgeon: [name], MD;   Location: [place] [facility];  Service: Cardiovascular;;  . PERIPHERAL VASCULAR INTERVENTION Right 03/21/2019   Procedure: PERIPHERAL VASCULAR INTERVENTION;  Surgeon: [name], MD;  Location: [place] [facility];  Service: Cardiovascular;  Laterality: Right;  superficial femoral     Medications: No outpatient medications have been marked as taking for the 02/28/20 encounter (Appointment) with Burtis Junes, NP.     Allergies: Allergies  Allergen Reactions  . Shellfish Allergy Anaphylaxis and Other (See Comments)    Tongue swelling, hives   . Sulfa Antibiotics Anaphylaxis and Rash    Tongue swelling, hives  . Ace Inhibitors Other (See Comments) and Cough    CKD, renal failure   . Invokana [Canagliflozin] Other (See Comments)    Syncope / dehydration  . Lisinopril Cough  . Metformin And Related Itching  . Pravastatin Sodium Other (See Comments)    myalgias  . Milk-Related Compounds     Ties stomach in knots   . Crestor [Rosuvastatin] Other (See Comments)    Myalgias   . Fenofibrate Other (See Comments)    Body aches - pt currently taking isnt sure if its causing any pain  . Horse-Derived Products Rash  . Iodine Other (See Comments)    ????. PATIENT SAYS HE DID FINE WITH LAST CT WITH CONTRAST** patient had IV contrast on 06/21/2018 without pre meds without any issues. Patient had allergic reaction to Shellfish 30 years ago-not to IV contrast.   . Lexapro [Escitalopram Oxalate] Other (See Comments)    Buzzing in ears,headache, felt like a zombie  . Lipitor [Atorvastatin] Other (See Comments)    myalgias  . Livalo [Pitavastatin] Other (See Comments)    Myalgias   . Repatha [Evolocumab] Other (See Comments)    Myalgias, flu like sx  . Tape Rash    Social History: The patient  reports that he quit smoking about 3 years ago. His smoking use included cigarettes. He has a 12.75 pack-year smoking history. He has never used smokeless tobacco. He reports that he does not  drink alcohol and does not use drugs.   Family History: The patient's ***family history includes Alzheimer's disease in his mother; Arthritis in his father and mother; Arthritis/Rheumatoid in his sister; Dementia in his maternal aunt and maternal uncle; Depression in his sister; Diabetes in his father and sister; Headache in his mother; Heart disease in his father and maternal uncle; Hyperlipidemia in his mother and sister; Hypertension in his mother and sister; Lung cancer in his mother; Stomach cancer in his paternal uncle; Stroke in his father and mother; Valvular heart disease in his father.   Review of Systems: Please see the history of present illness.   All other systems are reviewed and negative.   Physical Exam: VS:  There were no vitals taken for this visit. Marland Kitchen  BMI There is no height or weight on file to calculate BMI.  Wt Readings from  Last 3 Encounters:  02/15/20 (!) 300 lb 9.6 oz (136.4 kg)  01/01/20 300 lb (136.1 kg)  11/16/19 295 lb 3.2 oz (133.9 kg)    General: Pleasant. Well developed, well nourished and in no acute distress.   HEENT: Normal.  Neck: Supple, no JVD, carotid bruits, or masses noted.  Cardiac: ***Regular rate and rhythm. No murmurs, rubs, or gallops. No edema.  Respiratory:  Lungs are clear to auscultation bilaterally with normal work of breathing.  GI: Soft and nontender.  MS: No deformity or atrophy. Gait and ROM intact.  Skin: Warm and dry. Color is normal.  Neuro:  Strength and sensation are intact and no gross focal deficits noted.  Psych: Alert, appropriate and with normal affect.   LABORATORY DATA:  EKG:  EKG {ACTION; IS/IS JIR:[id number]} ordered today.  Personally reviewed by me. This demonstrates ***.  Lab Results  Component Value Date   WBC 8.5 02/14/2020   HGB 13.2 02/14/2020   HCT 39.4 02/14/2020   PLT 340 02/14/2020   GLUCOSE 205 (H) 02/14/2020   CHOL 232 (H) 02/14/2020   TRIG 621 (HH) 02/14/2020   HDL 15 (L) 02/14/2020   LDLDIRECT  172 (H) 11/08/2014   LDLCALC 108 (H) 02/14/2020   ALT 19 02/14/2020   AST 27 02/14/2020   NA 136 02/14/2020   K 5.1 02/14/2020   CL 94 (L) 02/14/2020   CREATININE 1.40 (H) 02/14/2020   BUN 24 02/14/2020   CO2 25 02/14/2020   TSH 1.910 01/01/2020   PSA 1.1 05/31/2013   INR 0.99 10/25/2011   HGBA1C 7.4 (H) 02/14/2020   MICROALBUR 100 05/31/2013     BNP (last 3 results) No results for input(s): BNP in the last 8760 hours.  ProBNP (last 3 results) No results for input(s): PROBNP in the last 8760 hours.   Other Studies Reviewed Today:  ECHO IMPRESSIONS 10/2019  1. Inferior basal akinesis . Left ventricular ejection fraction, by  estimation, is 50 to 55%. The left ventricle has low normal function. The  left ventricle demonstrates regional wall motion abnormalities (see  scoring diagram/findings for description).  There is mild left ventricular hypertrophy. Left ventricular diastolic  parameters were normal.  2. Right ventricular systolic function is normal. The right ventricular  size is normal.  3. The mitral valve is normal in structure. Trivial mitral valve  regurgitation. No evidence of mitral stenosis.  4. Calcification of the non coronary cusp . The aortic valve is  tricuspid. Aortic valve regurgitation is not visualized. Mild to moderate  aortic valve sclerosis/calcification is present, without any evidence of  aortic stenosis.  5. The inferior vena cava is dilated in size with >50% respiratory  variability, suggesting right atrial pressure of 8 mmHg.     Cardiac Catheterization: 07/30/2016 1. Mild nonobstructive LAD stenosis with severe branch vessel disease involving the first and second diagonals 2. Total occlusion of the right coronary artery within the previously implanted stent from 2010 with TIMI 2 antegrade flow and brisk collateral filling the PDA from the septal perforators of the LAD 3. Severe complex disease in the left circumflex extending into  the first obtuse marginal branch, treated successfully with overlapping drug-eluting stents 4. Normal LV function by echo assessment  Recommendations:  Resume anticoagulation with Eliquis tomorrow for PAF  Plavix 75 mg daily (loaded with 600 mg in cath lab)  No ASA (has had gingival bleeding and don't think he will tolerate triple anticoagulant therapy)  Consider PCI of the RCA if refractory angina.  However, with collateral filling of the PDA and need for chronic anticoagulation, concern over bleeding risk, may be best for medical therapy  Assessment & Plan:     1. CAD - s/p DES to RCA in 2010 with catheterization in 07/2016 showing CTO of RCA with collaterals present and DES x2 to LCx and OM1. - No angina  - continue Plavix, BB, and Imdur. Intolerant to statins in the past. Currently enrolled in the Clear Trial.   2. Paroxysmal Atrial Fibrillation - maintaining NSR continue beta blocker and eliquis   3. HTN - Well controlled.  Continue current medications and low sodium Dash type diet.     4. HLD - Intolerant to multiple statins and Repatha. Continue with Clear trial   5. PVD:  Improved RLE claudication post stenting SFA 04/18/19 residual total left SFA not amenable to percutaneous intervention f/u [name]  Last ABI on left 0.6 on 03/06/19 f/u ordered by [name]   6. Dyspnea:  Likely functional due to obesity F/U echo to make sure RV/LV function normal still. Told him to take nitro to see if that helps to make sure not anginal equivalent. Exam ok Also will call in Albuterol inhaler for allergy season      Current medicines are reviewed with the patient today.  The patient does not have concerns regarding medicines other than what has been noted above.  The following changes have been made:  See above.  Labs/ tests ordered today include:   No orders of the defined types were placed in this encounter.    Disposition:   FU with *** in {gen number  [id number]} {Days to years:10300}.   Patient is agreeable to this plan and will call if any problems develop in the interim.   SignedTruitt Merle, NP  02/18/2020 8:42 PM  [facility] [address] [place], [place]  [address] Phone: [phone] Fax: [phone]

## 2020-02-20 ENCOUNTER — Ambulatory Visit: Payer: Medicare Other | Admitting: Pharmacist

## 2020-02-27 ENCOUNTER — Other Ambulatory Visit: Payer: Self-pay

## 2020-02-27 ENCOUNTER — Ambulatory Visit (INDEPENDENT_AMBULATORY_CARE_PROVIDER_SITE_OTHER): Payer: Medicare Other | Admitting: Pharmacist

## 2020-02-27 DIAGNOSIS — E119 Type 2 diabetes mellitus without complications: Secondary | ICD-10-CM

## 2020-02-27 DIAGNOSIS — G720 Drug-induced myopathy: Secondary | ICD-10-CM | POA: Diagnosis not present

## 2020-02-27 MED ORDER — DAPAGLIFLOZIN PROPANEDIOL 5 MG PO TABS: 5.0000 mg | ORAL_TABLET | Freq: Every day | ORAL | 3 refills | Status: DC

## 2020-02-27 NOTE — Progress Notes (Signed)
    02/27/2020 Name: HALSTON KINTZ MRN: [id number] DOB: Jun 23, 1950  S:  [phone] Presents for diabetes evaluation, education, and management Patient was referred and last seen by Primary Care Provider on 02/15/20.  Insurance coverage/medication affordability: [employer] medicare  Patient reports adherence with medications. . Current diabetes medications include: insulin, ozempic . Current hypertension medications include: telmisartan Goal 130/80 . Current hyperlipidemia medications include: fibrate ICD 10 code: G72 added to patient's last encounter (drug induced myopathy)  Statin allergy updated in the EMR Medications reviewed Patient follows with cardiology and is currently taking fenofibrate for HLD,  He is currently enrolled in the [facility] to follow and make adjustments   Patient denies hypoglycemic events.   Patient reported dietary habits: Eats 2-3 meals/day . The patient is asked to make an attempt to improve diet and exercise patterns to aid in medical management of this problem. Discussed meal planning options and Plate method for healthy eating Avoid sugary drinks and desserts Incorporate balanced protein, non starchy veggies, 1 serving of carbohydrate with each meal Increase water intake Increase physical activity as able  The patient is asked to make an attempt to improve diet and exercise patterns to aid in medical management of this problem.    O:  Lab Results  Component Value Date   HGBA1C 7.4 (H) 02/14/2020    Lipid Panel     Component Value Date/Time   CHOL 232 (H) 02/14/2020 1135   TRIG 621 (HH) 02/14/2020 1135   HDL 15 (L) 02/14/2020 1135   CHOLHDL 15.5 (H) 02/14/2020 1135   CHOLHDL 8.5 07/31/2016 0258   VLDL 51 (H) 07/31/2016 0258   LDLCALC 108 (H) 02/14/2020 1135   LDLDIRECT 172 (H) 11/08/2014 0908   Home fasting blood sugars: n/a  2 hour post-meal/random blood sugars: n/a.   A/P:  Diabetes t2dM, A1C 7.4%.  Patient is adherent with  medication.   -DECREASE Insulin 70/30 TO 65 UNITS twice daily with meals  WILL WORK TO TRANSITION TO TRESIBA once daily  -Continue meal time insulin for now--Humalog 15 units with each meal  -Continue Ozempic 1mg  sq weekly (GLP1)  -Started SGLT2-I FARXIGA 5MG  DAILY (generic name DAPAGLIFLOZIN) for kidney/heart protection as well as for patient's DM.  Increase to 10mg  daily at next visit  SAMPLES GIVEN: [id number], EXP 4/24  Counseled Sick day rules: if sick, vomiting, have diarrhea, or cannot drink enough fluids, you should stop taking SGLT-2 inhibitors until your symptoms go away. In rare cases, these medicines can cause diabetic ketoacidosis (DKA). DKA is acid buildup in the blood.  -Extensively discussed pathophysiology of diabetes, recommended lifestyle interventions, dietary effects on blood sugar control  -Counseled on s/sx of and management of hypoglycemia  -Next A1C anticipated 3 months.  Written patient instructions provided.  Total time in face to face counseling 30 minutes.   Follow up Pharmacist Clinic Visit in 2 months (appt scheduled) Will see PCP in January 2022    Regina Eck, PharmD, BCPS Clinical Pharmacist, [place]  [facility] Phone [phone]

## 2020-02-28 ENCOUNTER — Ambulatory Visit: Payer: Medicare Other | Admitting: Nurse Practitioner

## 2020-03-01 ENCOUNTER — Other Ambulatory Visit: Payer: Self-pay

## 2020-03-01 ENCOUNTER — Encounter: Payer: Self-pay | Admitting: Cardiology

## 2020-03-01 ENCOUNTER — Ambulatory Visit: Payer: Medicare Other | Admitting: Cardiology

## 2020-03-01 VITALS — BP 116/60 | HR 82 | Ht 74.0 in | Wt 299.0 lb

## 2020-03-01 DIAGNOSIS — I2510 Atherosclerotic heart disease of native coronary artery without angina pectoris: Secondary | ICD-10-CM | POA: Diagnosis not present

## 2020-03-01 DIAGNOSIS — I739 Peripheral vascular disease, unspecified: Secondary | ICD-10-CM

## 2020-03-01 DIAGNOSIS — I5030 Unspecified diastolic (congestive) heart failure: Secondary | ICD-10-CM

## 2020-03-01 DIAGNOSIS — Z01818 Encounter for other preprocedural examination: Secondary | ICD-10-CM

## 2020-03-01 DIAGNOSIS — I70213 Atherosclerosis of native arteries of extremities with intermittent claudication, bilateral legs: Secondary | ICD-10-CM | POA: Diagnosis not present

## 2020-03-01 DIAGNOSIS — I480 Paroxysmal atrial fibrillation: Secondary | ICD-10-CM

## 2020-03-01 NOTE — Patient Instructions (Addendum)
Medication Instructions:  Your physician recommends that you continue on your current medications as directed. Please refer to the Current Medication list given to you today.  *If you need a refill on your cardiac medications before your next appointment, please call your pharmacy*   Lab Work: None ordered  If you have labs (blood work) drawn today and your tests are completely normal, you will receive your results only by: Marland Kitchen MyChart Message (if you have MyChart) OR . A paper copy in the mail If you have any lab test that is abnormal or we need to change your treatment, we will call you to review the results.   Testing/Procedures: None ordered   Follow-Up: At [facility], you and your health needs are our priority.  As part of our continuing mission to provide you with exceptional heart care, we have created designated Provider Care Teams.  These Care Teams include your primary Cardiologist (physician) and Advanced Practice Providers (APPs -  Physician Assistants and Nurse Practitioners) who all work together to provide you with the care you need, when you need it.  We recommend signing up for the patient portal called "MyChart".  Sign up information is provided on this After Visit Summary.  MyChart is used to connect with patients for Virtual Visits (Telemedicine).  Patients are able to view lab/test results, encounter notes, upcoming appointments, etc.  Non-urgent messages can be sent to your provider as well.   To learn more about what you can do with MyChart, go to [email or web address].    Your next appointment:   6 month(s)  The format for your next appointment:   In Person  Provider:   Jenkins Rouge, MD in the [place] office   Other Instructions

## 2020-03-01 NOTE — Telephone Encounter (Signed)
Patient is being seen by Cecilie Kicks today for preop clearance. Will remove from preop pool.

## 2020-03-01 NOTE — Telephone Encounter (Signed)
Patient with diagnosis of afib on Eliquis for anticoagulation.    Procedure: LEFT L4-S1 NRB Date of procedure: TBD     :[id number]}  CHA2DS2-VASc Score = 6  This indicates a 9.7% annual risk of stroke. The patient's score is based upon: CHF History: 0 HTN History: 1 Diabetes History: 1 Stroke History: 2 Vascular Disease History: 1 Age Score: 1 Gender Score: 0  CrCl 43mL/min using adjusted body weight Platelet count 340K  Typically hold Eliquis for 3 days prior to spinal procedure. Pt is at elevated cardiovascular risk off of anticoagulation. However, Dr [name] cleared pt to hold Eliquis for 3 days prior to previous ESI in June 2021. As long as no clinical changes noted during pre-op visit today, ok to hold Eliquis for 3 days again for upcoming spinal procedure.

## 2020-03-01 NOTE — Progress Notes (Signed)
Cardiology Office Note  Date:  03/01/2020   ID:  [name], DOB [birth year], MRN [id number]  PCP:  [name], FNP  Cardiologist:  Dr. [name]    Chief Complaint  Patient presents with  . Pre-op Exam      History of Present Illness: [name] [name] is a 69 y.o. male who presents for pre-op eval. For LEFT L4-S1 NRB for 02/19/20.  ?hold plavix.  s/p DES to RCA in 2010, cath in 07/2016 showing CTO of RCA with collaterals present and DES x2 to LCx and OM1  Last echo 11/01/19 with EF 50-55%, and valves stable.    Hx CAD with PCI, chronic diastolic HF and PAF,HTN, DM, GERD  Sees [name] for claudication. 11/30//320 had stenting right SFA. Had total occlusion of left SFA and unable to revascularize Dr [name] felt symptoms not severe enough to proceed with left fem-pop and back pain limits activity as much.  Repeat cath in 01/2018 showing patent stents along LCx and OM with CTO of D2, CTO of distal LCx, and CTO of RCA with collaterals present overall unchanged since 2018 with medical management recom   LINQ at [facility]   Today he has his chronic chest pain when he is in atrial fib. Last episode in Oct. 5th or so. Otherwise his back pain is his complaint. Plan for injection.     Past Medical History:  Diagnosis Date  . Anxiety   . Arthritis   . CAD (coronary artery disease)    a. 2010: DES to CTO of RCA. EF 55% b. 07/2016: cath showing total occlusion within previously placed RCA stent (collaterals present), severe stenosis along LCx and OM1 (treated with 2 overlapping DES). c. repeat cath in 01/2018 showing patent stents along LCx and OM with CTO of D2, CTO of distal LCx, and CTO of RCA with collaterals present overall unchanged since 2018 with medical management recom  . Cellulitis and abscess rt groin   . Depression   . Diabetes mellitus   . Disorders of iron metabolism   . GERD (gastroesophageal reflux disease)   . Hyperlipidemia   . Hypertension   . Low serum testosterone level    . Medically noncompliant   . Myocardial infarction [facility])     Past Surgical History:  Procedure Laterality Date  . ABDOMINAL AORTOGRAM W/LOWER EXTREMITY N/A 03/21/2019   Procedure: ABDOMINAL AORTOGRAM W/LOWER EXTREMITY;  Surgeon: [name], MD;  Location: [place] [facility];  Service: Cardiovascular;  Laterality: N/A;  . BACK SURGERY  2015   ACDF by Dr. [name]  . COLONOSCOPY N/A 10/01/2014   Dr. [name]: multiple tubular adenomas removed, colonic diverticulosis, redundant colon. next tcs advised for 09/2017. PATIENT NEEDS PROPOFOL FOR FAILED CONSCIOUS SEDATION  . CORONARY STENT INTERVENTION N/A 07/30/2016   Procedure: Coronary Stent Intervention;  Surgeon: [name], MD;  Location: [place] [facility];  Service: Cardiovascular;  Laterality: N/A;  . CORONARY STENT PLACEMENT  2000   By Dr. Olevia Perches  . EP IMPLANTABLE DEVICE N/A 05/25/2016   Procedure: Loop Recorder Insertion;  Surgeon: [name], MD;  Location: [place] [facility];  Service: Cardiovascular;  Laterality: N/A;  . ESOPHAGOGASTRODUODENOSCOPY     esophagus stretched remotely at [facility]  . ESOPHAGOGASTRODUODENOSCOPY N/A 10/01/2014   Dr. [name]: patchy mottling/erythema and minimal polypoid appearance of gastric mucosa. bx with mild inlammation but no H.pylori  . HERNIA REPAIR  [id number]   umbilical  . LEFT HEART CATH AND CORONARY ANGIOGRAPHY N/A  07/30/2016   Procedure: Left Heart Cath and Coronary Angiography;  Surgeon: [name], MD;  Location: [place] [facility];  Service: Cardiovascular;  Laterality: N/A;  . LEFT HEART CATH AND CORONARY ANGIOGRAPHY N/A 01/19/2018   Procedure: LEFT HEART CATH AND CORONARY ANGIOGRAPHY;  Surgeon: [name], MD;  Location: [place] [facility];  Service: Cardiovascular;  Laterality: N/A;  . LESION REMOVAL     Lip and hand   . LOWER EXTREMITY ANGIOGRAPHY N/A 04/18/2019   Procedure: LOWER EXTREMITY ANGIOGRAPHY;  Surgeon: [name], MD;  Location: [place] [facility];  Service:  Cardiovascular;  Laterality: N/A;  . NECK SURGERY    . PERIPHERAL VASCULAR BALLOON ANGIOPLASTY  04/18/2019   Procedure: PERIPHERAL VASCULAR BALLOON ANGIOPLASTY;  Surgeon: [name], MD;  Location: [place] [facility];  Service: Cardiovascular;;  . PERIPHERAL VASCULAR INTERVENTION Right 03/21/2019   Procedure: PERIPHERAL VASCULAR INTERVENTION;  Surgeon: [name], MD;  Location: [place] [facility];  Service: Cardiovascular;  Laterality: Right;  superficial femoral     Current Outpatient Medications  Medication Sig Dispense Refill  . albuterol (VENTOLIN HFA) 108 (90 Base) MCG/ACT inhaler Inhale 2 puffs into the lungs every 6 (six) hours as needed for wheezing or shortness of breath. 8.5 g 3  . AMBULATORY NON FORMULARY MEDICATION Take 180 mg by mouth daily at 12 noon. Medication Name: Clear Study Drug-Bempodic acid versus placebo    . apixaban (ELIQUIS) 5 MG TABS tablet Take 1 tablet (5 mg total) by mouth 2 (two) times daily. 180 tablet 4  . Chlorphen-Phenyleph-ASA (ALKA-SELTZER PLUS COLD PO) Take 2 tablets by mouth daily as needed (allergies).    . clopidogrel (PLAVIX) 75 MG tablet Take 1 tablet (75 mg total) by mouth daily. 30 tablet 11  . dapagliflozin propanediol (FARXIGA) 5 MG TABS tablet Take 1 tablet (5 mg total) by mouth daily before breakfast. 30 tablet 3  . DULoxetine (CYMBALTA) 30 MG capsule Take 1 capsule (30 mg total) by mouth daily. 90 capsule 4  . fenofibrate 160 MG tablet TAKE 1 TABLET BY MOUTH ONCE DAILY FOR CHOLESTEROL AND TRIGLYCERIDES 90 tablet 4  . furosemide (LASIX) 20 MG tablet TAKE 1 TABLET BY MOUTH ONCE DAILY AS NEEDED FOR EDEMA 90 tablet 0  . gabapentin (NEURONTIN) 300 MG capsule TAKE 2 CAPSULES BY MOUTH THREE TIMES DAILY 180 capsule 0  . HUMALOG KWIKPEN 100 UNIT/ML KwikPen INJECT 5 TO 20 UNITS SUBCUTANEOUSLY THREE TIMES DAILY WITH MEALS (Patient taking differently: 15 Units. INJECT 5 TO 20 UNITS SUBCUTANEOUSLY THREE TIMES DAILY WITH MEALS) 15 mL 0  . insulin  NPH-regular Human (HUMULIN 70/30) (70-30) 100 UNIT/ML injection Inject 90 Units into the skin 2 (two) times daily with a meal. (Patient taking differently: Inject 70 Units into the skin 2 (two) times daily with a meal. ) 60 mL 12  . isosorbide mononitrate (IMDUR) 30 MG 24 hr tablet Take 1 tablet (30 mg total) by mouth daily. 90 tablet 4  . linaclotide (LINZESS) 72 MCG capsule Take 1 capsule (72 mcg total) by mouth daily as needed (constipation). 90 capsule 3  . Menthol, Topical Analgesic, (BLUE-EMU MAXIMUM STRENGTH EX) Apply 1 application topically daily as needed (back pain).    . metoprolol succinate (TOPROL-XL) 50 MG 24 hr tablet Take 1 tablet by mouth once daily 90 tablet 1  . nitroGLYCERIN (NITROSTAT) 0.4 MG SL tablet Place 1 tablet (0.4 mg total) under the tongue every 5 (five) minutes x 3 doses as needed for  chest pain. 25 tablet 3  . Nutritional Supplements (KETO PO) Take 1,500 mg by mouth.    . pantoprazole (PROTONIX) 40 MG tablet Take 1 tablet by mouth once daily 90 tablet 1  . Semaglutide, 1 MG/DOSE, (OZEMPIC, 1 MG/DOSE,) 2 MG/1.5ML SOPN Inject 0.75 mLs (1 mg total) into the skin once a week. 12 mL 4  . telmisartan (MICARDIS) 20 MG tablet Take 1 tablet (20 mg total) by mouth daily. 90 tablet 1  . triamcinolone (NASACORT ALLERGY 24HR) 55 MCG/ACT AERO nasal inhaler Place 1 spray into the nose as needed.     . vitamin B-12 (CYANOCOBALAMIN) 1000 MCG tablet Take 1,000 mcg by mouth daily.     No current facility-administered medications for this visit.    Allergies:   Shellfish allergy, Sulfa antibiotics, Ace inhibitors, Invokana [canagliflozin], Lisinopril, Metformin and related, Pravastatin sodium, Milk-related compounds, Crestor [rosuvastatin], Fenofibrate, Horse-derived products, Iodine, Lexapro [escitalopram oxalate], Lipitor [atorvastatin], Livalo [pitavastatin], Repatha [evolocumab], and Tape    Social History:  The patient  reports that he quit smoking about 3 years ago. His smoking  use included cigarettes. He has a 12.75 pack-year smoking history. He has never used smokeless tobacco. He reports that he does not drink alcohol and does not use drugs.   Family History:  The patient's family history includes Alzheimer's disease in his mother; Arthritis in his father and mother; Arthritis/Rheumatoid in his sister; Dementia in his maternal aunt and maternal uncle; Depression in his sister; Diabetes in his father and sister; Headache in his mother; Heart disease in his father and maternal uncle; Hyperlipidemia in his mother and sister; Hypertension in his mother and sister; Lung cancer in his mother; Stomach cancer in his paternal uncle; Stroke in his father and mother; Valvular heart disease in his father.    ROS:  General:no colds or fevers, no weight changes Skin:no rashes or ulcers HEENT:no blurred vision, no congestion CV:see HPI PUL:see HPI GI:no diarrhea constipation or melena, no indigestion GU:no hematuria, no dysuria MS:no joint pain, + claudication Neuro:no syncope, no lightheadedness Endo:+ diabetes, no thyroid disease  Wt Readings from Last 3 Encounters:  03/01/20 299 lb (135.6 kg)  02/15/20 (!) 300 lb 9.6 oz (136.4 kg)  01/01/20 300 lb (136.1 kg)     PHYSICAL EXAM: VS:  BP 116/60   Pulse 82   Ht 6\' 2"  (1.88 m)   Wt 299 lb (135.6 kg)   SpO2 95%   BMI 38.39 kg/m  , BMI Body mass index is 38.39 kg/m. General:Pleasant affect, NAD Skin:Warm and dry, brisk capillary refill HEENT:normocephalic, sclera clear, mucus membranes moist Neck:supple, no JVD, no bruits  Heart:S1S2 RRR without murmur, gallup, rub or click Lungs:clear without rales, rhonchi, or wheezes FTD:DUKG, non tender, + BS, do not palpate liver spleen or masses Ext:no lower ext edema, 1+ pedal pulses, 2+ radial pulses Neuro:alert and oriented X 3, MAE, follows commands, + facial symmetry    EKG:  EKG is ordered today. The ekg ordered today demonstrates SR with nonspecific T wave  abnormality. Stable EKG   Recent Labs: 01/01/2020: TSH 1.910 02/14/2020: ALT 19; BUN 24; Creatinine, Ser 1.40; Hemoglobin 13.2; Platelets 340; Potassium 5.1; Sodium 136    Lipid Panel    Component Value Date/Time   CHOL 232 (H) 02/14/2020 1135   TRIG 621 (HH) 02/14/2020 1135   HDL 15 (L) 02/14/2020 1135   CHOLHDL 15.5 (H) 02/14/2020 1135   CHOLHDL 8.5 07/31/2016 0258   VLDL 51 (H) 07/31/2016 0258   LDLCALC 108 (  H) 02/14/2020 1135   LDLDIRECT 172 (H) 11/08/2014 1937       Other studies Reviewed: Additional studies/ records that were reviewed today include: . Echo 11/01/19 IMPRESSIONS    1. Inferior basal akinesis . Left ventricular ejection fraction, by  estimation, is 50 to 55%. The left ventricle has low normal function. The  left ventricle demonstrates regional wall motion abnormalities (see  scoring diagram/findings for description).  There is mild left ventricular hypertrophy. Left ventricular diastolic  parameters were normal.  2. Right ventricular systolic function is normal. The right ventricular  size is normal.  3. The mitral valve is normal in structure. Trivial mitral valve  regurgitation. No evidence of mitral stenosis.  4. Calcification of the non coronary cusp . The aortic valve is  tricuspid. Aortic valve regurgitation is not visualized. Mild to moderate  aortic valve sclerosis/calcification is present, without any evidence of  aortic stenosis.  5. The inferior vena cava is dilated in size with >50% respiratory  variability, suggesting right atrial pressure of 8 mmHg.   FINDINGS  Left Ventricle: Inferior basal akinesis. Left ventricular ejection  fraction, by estimation, is 50 to 55%. The left ventricle has low normal  function. The left ventricle demonstrates regional wall motion  abnormalities. The left ventricular internal  cavity size was normal in size. There is mild left ventricular  hypertrophy. Left ventricular diastolic parameters were  normal.   Right Ventricle: The right ventricular size is normal. No increase in  right ventricular wall thickness. Right ventricular systolic function is  normal.   Left Atrium: Left atrial size was normal in size.   Right Atrium: Right atrial size was normal in size.   Pericardium: There is no evidence of pericardial effusion.   Mitral Valve: The mitral valve is normal in structure. Normal mobility of  the mitral valve leaflets. Trivial mitral valve regurgitation. No evidence  of mitral valve stenosis.   Tricuspid Valve: The tricuspid valve is normal in structure. Tricuspid  valve regurgitation is trivial. No evidence of tricuspid stenosis.   Aortic Valve: Calcification of the non coronary cusp. The aortic valve is  tricuspid. Aortic valve regurgitation is not visualized. Mild to moderate  aortic valve sclerosis/calcification is present, without any evidence of  aortic stenosis.   Pulmonic Valve: The pulmonic valve was normal in structure. Pulmonic valve  regurgitation is not visualized. No evidence of pulmonic stenosis.    Cardiac cath 01/19/18   Prox LAD lesion is 40% stenosed.  Ost 1st Diag lesion is 40% stenosed.  Ost 2nd Diag to 2nd Diag lesion is 100% stenosed.  Mid Cx to Dist Cx lesion is 100% stenosed.  Prox RCA to Mid RCA lesion is 90% stenosed.  Prox RCA lesion is 99% stenosed.  Previously placed Ost 1st Mrg to 1st Mrg stent (unknown type) is widely patent.  Previously placed Prox Cx stent (unknown type) is widely patent.   Significant three-vessel coronary obstructive disease with previously noted 40% proximal LAD stenosis, 40% stenosis in the inferior branch of the first diagonal vessel with total occlusion of the second diagonal vessel with retrograde collateralization to this diagonal vessel;   Small normal ramus intermediate vessel;  Widely patent stent extending from the proximal circumflex into the OM1 vessel without stenosis, with old total  occlusion of the mid distal circumflex after the second marginal vessel which is collateralized from the LAD and RCA;  Old subtotal/ total mid RCA stenosis at the site of prior stenting with extensive left-to-right collaterals as  well as antegrade flow distally with competitive filling due to collateral flow and collateralization from the SA nodal artery supplying the distal circumflex.  LVEDP 15 mm Hg.   RECOMMENDATION: Increase medical therapy.  Present catheterization is essentially unchanged from the prior study of March 2018.  Aggressive lipid-lowering therapy with target LDL less than 70. oPTIMAL blood pressure control with target blood pressure less than 130/80. consider evaluation for sleep apnea in this patient with atrial fibrillation.  Recommend to resume Apixaban, at currently prescribed dose and frequency, on 01/20/18.  Recommend concurrent antiplatelet therapy of Clopidogrel 75mg  daily.   ASSESSMENT AND PLAN:  1.  Pre-op eval for back injection.  Has his chronic chest pain with atrial fib but otherwise no angina.  He is at acceptable risk for back injection.  Ok to hold plavix for 5 days prior to procedure and hold Eliquis for 3 days prior to procedure.   Resume both post procedure  2.  CAD with prior stents, stable on last cath 2019 and no acute angina since.   3.  PAF occurs with stress.  On eliquis, ok to hold for procedure.  Resume post procedure. On toprol XL 50 mg dialy.   4.  Back pain per Neuro  5.  PAD with claudication per Dr. [name].   Follow up with Dr. [name] in [place] in 6 months.  6.  HLD elevated triglycerides and LDL discussed and he is trying to improved diet. Intolerance to statins.      7.  DM on insulin and faxiga per PCP  8.  HTN controlled, continue current meds.     9.  Chronic diastolic HF stable is taking lasix daily. Labs in Sept reviewed and stable.   Current medicines are reviewed with the patient today.  The patient Has no concerns  regarding medicines.  The following changes have been made:  See above Labs/ tests ordered today include:see above  Disposition:   FU:  see above  Signed, Cecilie Kicks, NP  03/01/2020 12:27 PM    [facility] [place], [place], [address] [place], [name] Phone: [phone]; Fax: [phone]

## 2020-03-05 ENCOUNTER — Other Ambulatory Visit: Payer: Self-pay | Admitting: Family

## 2020-03-05 DIAGNOSIS — Z9861 Coronary angioplasty status: Secondary | ICD-10-CM

## 2020-03-05 DIAGNOSIS — I5030 Unspecified diastolic (congestive) heart failure: Secondary | ICD-10-CM

## 2020-03-05 DIAGNOSIS — I2510 Atherosclerotic heart disease of native coronary artery without angina pectoris: Secondary | ICD-10-CM

## 2020-03-07 DIAGNOSIS — M4726 Other spondylosis with radiculopathy, lumbar region: Secondary | ICD-10-CM | POA: Diagnosis not present

## 2020-03-07 DIAGNOSIS — M5136 Other intervertebral disc degeneration, lumbar region: Secondary | ICD-10-CM | POA: Diagnosis not present

## 2020-03-12 ENCOUNTER — Other Ambulatory Visit: Payer: Self-pay | Admitting: Family

## 2020-03-21 ENCOUNTER — Encounter: Payer: Medicare Other | Admitting: *Deleted

## 2020-03-21 ENCOUNTER — Other Ambulatory Visit: Payer: Self-pay

## 2020-03-21 VITALS — BP 164/54 | HR 71 | Wt 300.0 lb

## 2020-03-21 DIAGNOSIS — Z006 Encounter for examination for normal comparison and control in clinical research program: Secondary | ICD-10-CM

## 2020-03-21 NOTE — Research (Signed)
Subject came to research clinic for CLEAR visit T14, M36.   Subject was re-consented to [place] Version 8.1 [id number] Local [id number] on November 4th, 2021 at 0810.    Subject Name: [name] [name]  Subject met inclusion and exclusion criteria.  The informed consent form, study requirements and expectations were reviewed with the subject and questions and concerns were addressed prior to the signing of the consent form.  The subject verbalized understanding of the trial requirements.  The subject agreed to participate in the CLEAR trial and signed the informed consent at 0810 on 03/21/20  The informed consent was obtained prior to performance of any protocol-specific procedures for the subject.  A copy of the signed informed consent was given to the subject and a copy was placed in the subject's medical record.   [name]    Subject's concomitant medications have been reviewed and updated if applicable. No new AE's or SAE's to report to sponsor at this time. Subject was 99% compliant with IP. New IP was dispensed and next appointment was scheduled for Thursday, May 5th, 2021 at 0800.

## 2020-04-09 ENCOUNTER — Ambulatory Visit: Payer: Medicare Other | Admitting: Family Medicine

## 2020-04-09 DIAGNOSIS — M1711 Unilateral primary osteoarthritis, right knee: Secondary | ICD-10-CM | POA: Diagnosis not present

## 2020-04-09 DIAGNOSIS — M5136 Other intervertebral disc degeneration, lumbar region: Secondary | ICD-10-CM | POA: Diagnosis not present

## 2020-04-09 DIAGNOSIS — I959 Hypotension, unspecified: Secondary | ICD-10-CM | POA: Diagnosis not present

## 2020-04-09 DIAGNOSIS — M5416 Radiculopathy, lumbar region: Secondary | ICD-10-CM | POA: Diagnosis not present

## 2020-04-09 DIAGNOSIS — M4807 Spinal stenosis, lumbosacral region: Secondary | ICD-10-CM | POA: Diagnosis not present

## 2020-04-09 DIAGNOSIS — Z79891 Long term (current) use of opiate analgesic: Secondary | ICD-10-CM | POA: Diagnosis not present

## 2020-04-10 ENCOUNTER — Telehealth: Payer: Self-pay | Admitting: Internal Medicine

## 2020-04-10 NOTE — Telephone Encounter (Signed)
[name] is calling to report Case had CP yesterday and called EMS due to thinking he was having a heart attack. She states he reports that determined it was Afib and advised him to drink more water. [name] [name] also had a toenail come loose yesterday and it bleed for longer than it should've due to him being on Eliquis. [name] also reports [name] is having some dizziness today and his last BP reading he gave her prior to this call was 115/79. He is not having active CP now, but told [name] he thinks he's in Afib again. [name] is reporting this to make Dr. Lovena Le aware due to their office closing for the holiday today. She is requesting the patient be called to discuss further. Due to this she gave the pt's contact number instead of hers. Please advise.

## 2020-04-10 NOTE — Telephone Encounter (Signed)
I spoke with patient for approx 30 minutes . He is currently without any complaints of CP. We talked about our after hour hours number he can call and speak with the on-call provider. He also has an on call nurse with [facility] also.

## 2020-04-17 ENCOUNTER — Other Ambulatory Visit: Payer: Self-pay

## 2020-04-17 ENCOUNTER — Other Ambulatory Visit: Payer: Medicare Other

## 2020-04-17 DIAGNOSIS — E1122 Type 2 diabetes mellitus with diabetic chronic kidney disease: Secondary | ICD-10-CM | POA: Diagnosis not present

## 2020-04-17 DIAGNOSIS — E1129 Type 2 diabetes mellitus with other diabetic kidney complication: Secondary | ICD-10-CM | POA: Diagnosis not present

## 2020-04-17 DIAGNOSIS — I129 Hypertensive chronic kidney disease with stage 1 through stage 4 chronic kidney disease, or unspecified chronic kidney disease: Secondary | ICD-10-CM | POA: Diagnosis not present

## 2020-04-17 DIAGNOSIS — N189 Chronic kidney disease, unspecified: Secondary | ICD-10-CM | POA: Diagnosis not present

## 2020-04-17 DIAGNOSIS — R809 Proteinuria, unspecified: Secondary | ICD-10-CM | POA: Diagnosis not present

## 2020-04-19 DIAGNOSIS — R809 Proteinuria, unspecified: Secondary | ICD-10-CM | POA: Diagnosis not present

## 2020-04-19 DIAGNOSIS — I5032 Chronic diastolic (congestive) heart failure: Secondary | ICD-10-CM | POA: Diagnosis not present

## 2020-04-19 DIAGNOSIS — E559 Vitamin D deficiency, unspecified: Secondary | ICD-10-CM | POA: Diagnosis not present

## 2020-04-19 DIAGNOSIS — N189 Chronic kidney disease, unspecified: Secondary | ICD-10-CM | POA: Diagnosis not present

## 2020-04-19 DIAGNOSIS — I129 Hypertensive chronic kidney disease with stage 1 through stage 4 chronic kidney disease, or unspecified chronic kidney disease: Secondary | ICD-10-CM | POA: Diagnosis not present

## 2020-04-29 ENCOUNTER — Ambulatory Visit (INDEPENDENT_AMBULATORY_CARE_PROVIDER_SITE_OTHER): Payer: Medicare Other | Admitting: Pharmacist

## 2020-04-29 ENCOUNTER — Other Ambulatory Visit: Payer: Self-pay

## 2020-04-29 DIAGNOSIS — E119 Type 2 diabetes mellitus without complications: Secondary | ICD-10-CM

## 2020-04-29 DIAGNOSIS — G720 Drug-induced myopathy: Secondary | ICD-10-CM

## 2020-04-29 NOTE — Progress Notes (Signed)
    04/29/2020 Name: [name] [name] MRN: [id number] DOB: 1950/09/07   S:  [name] Presents for diabetes evaluation, education, and management Patient was referred and last seen by Primary Care Provider on 02/26/2021. Insurance coverage/medication affordability: [employer] medicare  Patient reports adherence with medications. . Current diabetes medications include: farxiga 5mg , tresiba 80u BID, humalog 15 un TID meals, ozempic 1mg  . Current hypertension medications include: telmisartan Goal 130/80 . Current hyperlipidemia medications include: fibrate ICD 10 code: G72 added to patient's last encounter (drug induced myopathy)  Statin allergy updated in the EMR Medications reviewed Patient follows with cardiology and is currently takingfenofibratefor HLD, He is currently enrolled in the [facility] to follow and make adjustments   Patient denies hypoglycemic events.   Patient reported dietary habits: Eats 2-3 meals/day Discussed meal planning options and Plate method for healthy eating . Avoid sugary drinks and desserts . Incorporate balanced protein, non starchy veggies, 1 serving of carbohydrate with each meal . Increase water intake . Increase physical activity as able  Patient-reported exercise habits: n/a  Patient reports nocturia (nighttime urination).  Patient reports neuropathy (nerve pain).  Patient denies visual changes.  Patient reports self foot exams.    O:  Lab Results  Component Value Date   HGBA1C 7.4 (H) 02/14/2020    Lipid Panel     Component Value Date/Time   CHOL 232 (H) 02/14/2020 1135   TRIG 621 (HH) 02/14/2020 1135   HDL 15 (L) 02/14/2020 1135   CHOLHDL 15.5 (H) 02/14/2020 1135   CHOLHDL 8.5 07/31/2016 0258   VLDL 51 (H) 07/31/2016 0258   LDLCALC 108 (H) 02/14/2020 1135   LDLDIRECT 172 (H) 11/08/2014 0908    Home fasting blood sugars: not checking consistently, usually <160  2 hour post-meal/random blood sugars: <200.    A/P:  Diabetes T2DM, A1c 7.4%-->A1c has increased since last visit.  Patient is adherent with medication. Control is suboptimal due to diet/lifestyle.  -APPLICATION FOR NOVO [place]ick[place] prepared--awaiting patient financials (tresiba, fiasp, ozempic) for 2022  Medication to ship to PCP office (4 month supply)  -CONTINUE Tresiba 80 units UNITS twice daily with meals  Will switch to u-200-more concentrated  -Continue meal time insulin for now--Humalog 15-20 units with each meal--will switch to Fiasp for rapid acting once patient assistance approved  -Continue Ozempic 1mg  sq weekly (GLP1)  -Continue  SGLT2-I FARXIGA 5MG  DAILY (generic name DAPAGLIFLOZIN) for kidney/heart protection as well as for patient's DM.  GFR 51 Patient reports increased urine output--already on diuretics, will continue current management.  Patient battles dehydration as well/fine balance of the two conditions           CounseledSick day rules: ifsick, vomiting, have diarrhea, or cannot drink enough fluids, you should stop taking SGLT-2 inhibitors until your symptoms go away. In rare cases, these medicines can cause diabetic ketoacidosis (DKA). DKA is acid buildup in the blood.  -Extensively discussed pathophysiology of diabetes, recommended lifestyle interventions, dietary effects on blood sugar control  -Counseled on s/sx of and management of hypoglycemia  -Next A1C anticipated 05/20/20.  Written patient instructions provided.  Total time in face to face counseling 20 minutes.   Follow up PCP Clinic Visit ON 05/20/20.    Regina Eck, PharmD, BCPS Clinical Pharmacist, [place]  [facility] Phone [phone]

## 2020-05-01 ENCOUNTER — Telehealth: Payer: Self-pay

## 2020-05-01 NOTE — Telephone Encounter (Signed)
Tiffany from [facility] should pt come off of apixaban (ELIQUIS) 5 MG TABS tablet prior to dental extractions. He is scheduled tomorrow at 1:00pm. Jonelle Sidle will fax over a clearance form as well. Please call back.

## 2020-05-03 NOTE — Telephone Encounter (Signed)
Did patient already have dental procedure? I would recommend holding Eliquis at least 24 hours.

## 2020-05-03 NOTE — Telephone Encounter (Signed)
Patient states he had procedure done yesterday and started back on it today.

## 2020-05-06 ENCOUNTER — Other Ambulatory Visit: Payer: Self-pay

## 2020-05-06 DIAGNOSIS — I480 Paroxysmal atrial fibrillation: Secondary | ICD-10-CM

## 2020-05-06 NOTE — Telephone Encounter (Signed)
66m, 135.6 kg, scr 1.4 02/14/20, lovw/ingold ([facility] office 03/01/20) Drug interaction noticed will route to pharmd pool for clarification on refill High  Drug-Drug: apixaban and clopidogrel Use of Direct Factor Xa Inhibitors with Antiplatelet Agents may increase the risk of bleeding. apixaban (ELIQUIS) 5 MG TABS tablet Prescription. Reordered. Long-term.  Drug-Drug: ALKA-SELTZER PLUS COLD PO and apixaban Use of Direct Factor Xa Inhibitors with Aspirin may increase the risk of bleeding.  Chlorphen-Phenyleph-ASA (ALKA-SELTZER PLUS COLD PO) Patient reported medication. Active.

## 2020-05-07 MED ORDER — APIXABAN 5 MG PO TABS: 5.0000 mg | ORAL_TABLET | Freq: Two times a day (BID) | ORAL | 1 refills | Status: DC

## 2020-05-07 NOTE — Telephone Encounter (Signed)
Ok to fill, refill sent in

## 2020-05-13 ENCOUNTER — Other Ambulatory Visit: Payer: Self-pay

## 2020-05-13 DIAGNOSIS — I480 Paroxysmal atrial fibrillation: Secondary | ICD-10-CM

## 2020-05-13 MED ORDER — APIXABAN 5 MG PO TABS: 5.0000 mg | ORAL_TABLET | Freq: Two times a day (BID) | ORAL | 1 refills | Status: DC

## 2020-05-13 NOTE — Telephone Encounter (Signed)
17m, 135.6kg, scr 1.4 (02/14/20) lovw/ingold (03/01/20) pt requests refill of eliquis 5mg  to be sent to [facility] but will route to pharmd pool to evaulate drug reactions to make sure safe for refill.   Drug-Drug: apixaban and clopidogrel Use of Direct Factor Xa Inhibitors with Antiplatelet Agents may increase the risk of bleeding.  Details Override reason  Press F5 to access options          apixaban (ELIQUIS) 5 MG TABS tablet Prescription. Reordered. Long-term.   clopidogrel (PLAVIX) 75 MG tablet Prescription. Active.  Discontinue   High  Drug-Drug: ALKA-SELTZER PLUS COLD PO and apixaban Use of Direct Factor Xa Inhibitors with Aspirin may increase the risk of bleeding.  Details Override reason  Press F5 to access options          apixaban (ELIQUIS) 5 MG TABS tablet Prescription. Reordered. Long-term.   Chlorphen-Phenyleph-ASA (ALKA-SELTZER PLUS COLD PO) Patient reported medication. Active.

## 2020-05-13 NOTE — Telephone Encounter (Signed)
Ok to remain on Plavix and Eliquis

## 2020-05-16 ENCOUNTER — Telehealth: Payer: Self-pay

## 2020-05-16 MED ORDER — TRESIBA FLEXTOUCH 200 UNIT/ML ~~LOC~~ SOPN: 80.0000 [IU] | PEN_INJECTOR | Freq: Two times a day (BID) | SUBCUTANEOUS | 3 refills | Status: DC

## 2020-05-16 NOTE — Telephone Encounter (Signed)
Pt stable on current dose of tresiba--new RX called in to [facility]  Samples in fridge, patient aware

## 2020-05-20 ENCOUNTER — Other Ambulatory Visit: Payer: Self-pay | Admitting: Surgery

## 2020-05-20 ENCOUNTER — Ambulatory Visit (INDEPENDENT_AMBULATORY_CARE_PROVIDER_SITE_OTHER): Payer: Medicare Other | Admitting: Family

## 2020-05-20 ENCOUNTER — Other Ambulatory Visit: Payer: Self-pay

## 2020-05-20 ENCOUNTER — Encounter: Payer: Self-pay | Admitting: Family

## 2020-05-20 VITALS — BP 146/82 | HR 122 | Temp 96.1°F | Ht 74.0 in | Wt 298.6 lb

## 2020-05-20 DIAGNOSIS — I4891 Unspecified atrial fibrillation: Secondary | ICD-10-CM | POA: Diagnosis not present

## 2020-05-20 DIAGNOSIS — E119 Type 2 diabetes mellitus without complications: Secondary | ICD-10-CM | POA: Diagnosis not present

## 2020-05-20 DIAGNOSIS — R079 Chest pain, unspecified: Secondary | ICD-10-CM | POA: Diagnosis not present

## 2020-05-20 DIAGNOSIS — I499 Cardiac arrhythmia, unspecified: Secondary | ICD-10-CM | POA: Diagnosis not present

## 2020-05-20 DIAGNOSIS — R0789 Other chest pain: Secondary | ICD-10-CM | POA: Diagnosis not present

## 2020-05-20 DIAGNOSIS — R000 Tachycardia, unspecified: Secondary | ICD-10-CM | POA: Diagnosis not present

## 2020-05-20 DIAGNOSIS — R0602 Shortness of breath: Secondary | ICD-10-CM | POA: Diagnosis not present

## 2020-05-20 LAB — CBC WITH DIFFERENTIAL/PLATELET
Basophils Absolute: 0.1 10*3/uL (ref 0.0–0.2)
Basos: 1 %
EOS (ABSOLUTE): 0.2 10*3/uL (ref 0.0–0.4)
Eos: 2 %
Hematocrit: 41.9 % (ref 37.5–51.0)
Hemoglobin: 14.1 g/dL (ref 13.0–17.7)
Immature Grans (Abs): 0 10*3/uL (ref 0.0–0.1)
Immature Granulocytes: 0 %
Lymphocytes Absolute: 1.7 10*3/uL (ref 0.7–3.1)
Lymphs: 22 %
MCH: 29.3 pg (ref 26.6–33.0)
MCHC: 33.7 g/dL (ref 31.5–35.7)
MCV: 87 fL (ref 79–97)
Monocytes Absolute: 0.6 10*3/uL (ref 0.1–0.9)
Monocytes: 7 %
Neutrophils Absolute: 5.3 10*3/uL (ref 1.4–7.0)
Neutrophils: 68 %
Platelets: 365 10*3/uL (ref 150–450)
RBC: 4.81 x10E6/uL (ref 4.14–5.80)
RDW: 13.3 % (ref 11.6–15.4)
WBC: 7.9 10*3/uL (ref 3.4–10.8)

## 2020-05-20 LAB — CMP14+EGFR
ALT: 18 IU/L (ref 0–44)
AST: 22 IU/L (ref 0–40)
Albumin/Globulin Ratio: 1.5 (ref 1.2–2.2)
Albumin: 4.3 g/dL (ref 3.8–4.8)
Alkaline Phosphatase: 51 IU/L (ref 44–121)
BUN/Creatinine Ratio: 17 (ref 10–24)
BUN: 24 mg/dL (ref 8–27)
Bilirubin Total: 0.5 mg/dL (ref 0.0–1.2)
CO2: 26 mmol/L (ref 20–29)
Calcium: 10.1 mg/dL (ref 8.6–10.2)
Chloride: 97 mmol/L (ref 96–106)
Creatinine, Ser: 1.42 mg/dL — ABNORMAL HIGH (ref 0.76–1.27)
GFR calc Af Amer: 58 mL/min/{1.73_m2} — ABNORMAL LOW (ref >59)
GFR calc non Af Amer: 50 mL/min/{1.73_m2} — ABNORMAL LOW (ref >59)
Globulin, Total: 2.8 g/dL (ref 1.5–4.5)
Glucose: 137 mg/dL — ABNORMAL HIGH (ref 65–99)
Potassium: 4.5 mmol/L (ref 3.5–5.2)
Sodium: 138 mmol/L (ref 134–144)
Total Protein: 7.1 g/dL (ref 6.0–8.5)

## 2020-05-20 LAB — BAYER DCA HB A1C WAIVED: HB A1C (BAYER DCA - WAIVED): 7.2 % — ABNORMAL HIGH (ref <7.0)

## 2020-05-20 NOTE — Patient Instructions (Signed)

## 2020-05-20 NOTE — Progress Notes (Signed)
Subjective:    Patient ID: [name], male    DOB: May 30, 1950, 70 y.o.   MRN: [id number]  Chief Complaint  Patient presents with  . Medical Management of Chronic Issues   PT presents to the office today for chronic follow up, however, in the waiting developed chest pain, SOB, and pain radiating down shoulder.  Chest Pain  This is a new problem. The current episode started today. The problem occurs intermittently. The problem has been waxing and waning. The pain is mild. The quality of the pain is described as pressure. The pain radiates to the left arm. Associated symptoms include lower extremity edema, malaise/fatigue, shortness of breath and weakness. Pertinent negatives include no back pain, claudication, cough, dizziness, exertional chest pressure, fever or palpitations. The pain is aggravated by nothing. He has tried rest for the symptoms. The treatment provided mild relief.  His past medical history is significant for heart disease.  Diabetes He presents for his follow-up diabetic visit. He has type 2 diabetes mellitus. His disease course has been stable. Pertinent negatives for hypoglycemia include no dizziness. Associated symptoms include chest pain, foot paresthesias and weakness. Symptoms are stable. Diabetic complications include a CVA, heart disease and peripheral neuropathy. Risk factors for coronary artery disease include dyslipidemia, diabetes mellitus, hypertension, male sex and sedentary lifestyle.      Review of Systems  Constitutional: Positive for malaise/fatigue. Negative for fever.  Respiratory: Positive for shortness of breath. Negative for cough.   Cardiovascular: Positive for chest pain. Negative for palpitations and claudication.  Musculoskeletal: Negative for back pain.  Neurological: Positive for weakness. Negative for dizziness.  All other systems reviewed and are negative.      Objective:   Physical Exam Vitals reviewed.  Constitutional:       General: He is not in acute distress.    Appearance: He is well-developed and well-nourished.  HENT:     Head: Normocephalic.     Mouth/Throat:     Mouth: Oropharynx is clear and moist.  Eyes:     General:        Right eye: No discharge.        Left eye: No discharge.     Pupils: Pupils are equal, round, and reactive to light.  Neck:     Thyroid: No thyromegaly.  Cardiovascular:     Rate and Rhythm: Tachycardia present. Rhythm irregular.     Pulses: Intact distal pulses.     Heart sounds: Normal heart sounds. No murmur heard.   Pulmonary:     Effort: Pulmonary effort is normal. No respiratory distress.     Breath sounds: Normal breath sounds. No wheezing.  Abdominal:     General: Bowel sounds are normal. There is no distension.     Palpations: Abdomen is soft.     Tenderness: There is no abdominal tenderness.  Musculoskeletal:        General: No tenderness or edema. Normal range of motion.     Cervical back: Normal range of motion and neck supple.  Skin:    General: Skin is warm and dry.     Findings: No erythema or rash.  Neurological:     Mental Status: He is alert and oriented to person, place, and time.     Cranial Nerves: No cranial nerve deficit.     Deep Tendon Reflexes: Reflexes are normal and symmetric.  Psychiatric:        Mood and Affect: Mood and affect normal.  Behavior: Behavior normal.        Thought Content: Thought content normal.        Judgment: Judgment normal.      BP (!) 146/82   Pulse (!) 122   Temp (!) 96.1 F (35.6 C) (Temporal)   Ht _0  (1.88 m)   Wt 298 lb 9.6 oz (135.4 kg)   SpO2 100%   BMI 38.34 kg/m      Assessment & Plan:  [name] comes in today with chief complaint of Medical Management of Chronic Issues   Diagnosis and orders addressed:  1. Chest pain, unspecified type - EKG 12-Lead - CMP14+EGFR - CBC with Differential/Platelet  2. SOB (shortness of breath) - CMP14+EGFR - CBC with  Differential/Platelet  3. Atrial fibrillation, unspecified type ([place]) - CMP14+EGFR - CBC with Differential/Platelet - Bayer DCA Hb A1c Waived  4. Morbid obesity ([place]) - CMP14+EGFR - CBC with Differential/Platelet  EMS called. Pt refused to go with patient. Spent 40 mins with patient discussing risk. He refuses to go to ED. Discussed if symptoms worsen or do not improve go to ED. He signed refusal paper work from EMS.  Labs pending Health Maintenance reviewed Diet and exercise encouraged  Follow up plan: 3 months   [name], FNP

## 2020-05-22 LAB — HM DIABETES EYE EXAM

## 2020-05-23 ENCOUNTER — Emergency Department (HOSPITAL_COMMUNITY): Payer: Medicare Other

## 2020-05-23 ENCOUNTER — Inpatient Hospital Stay (HOSPITAL_COMMUNITY)
Admission: EM | Admit: 2020-05-23 | Discharge: 2020-05-25 | DRG: 247 | Disposition: A | Discharge status: Home / Self Care | Payer: Medicare Other | Attending: Internal Medicine | Admitting: Internal Medicine

## 2020-05-23 ENCOUNTER — Other Ambulatory Visit: Payer: Self-pay

## 2020-05-23 ENCOUNTER — Encounter (HOSPITAL_COMMUNITY): Payer: Self-pay

## 2020-05-23 DIAGNOSIS — Z955 Presence of coronary angioplasty implant and graft: Secondary | ICD-10-CM

## 2020-05-23 DIAGNOSIS — Z7902 Long term (current) use of antithrombotics/antiplatelets: Secondary | ICD-10-CM | POA: Diagnosis not present

## 2020-05-23 DIAGNOSIS — E1169 Type 2 diabetes mellitus with other specified complication: Secondary | ICD-10-CM | POA: Diagnosis not present

## 2020-05-23 DIAGNOSIS — N1831 Chronic kidney disease, stage 3a: Secondary | ICD-10-CM | POA: Diagnosis not present

## 2020-05-23 DIAGNOSIS — I213 ST elevation (STEMI) myocardial infarction of unspecified site: Secondary | ICD-10-CM | POA: Diagnosis not present

## 2020-05-23 DIAGNOSIS — I2510 Atherosclerotic heart disease of native coronary artery without angina pectoris: Secondary | ICD-10-CM | POA: Diagnosis present

## 2020-05-23 DIAGNOSIS — Z794 Long term (current) use of insulin: Secondary | ICD-10-CM | POA: Diagnosis not present

## 2020-05-23 DIAGNOSIS — Z20822 Contact with and (suspected) exposure to covid-19: Secondary | ICD-10-CM | POA: Diagnosis not present

## 2020-05-23 DIAGNOSIS — T82855A Stenosis of coronary artery stent, initial encounter: Secondary | ICD-10-CM | POA: Diagnosis not present

## 2020-05-23 DIAGNOSIS — E781 Pure hyperglyceridemia: Secondary | ICD-10-CM | POA: Diagnosis present

## 2020-05-23 DIAGNOSIS — I4891 Unspecified atrial fibrillation: Secondary | ICD-10-CM | POA: Diagnosis present

## 2020-05-23 DIAGNOSIS — I214 Non-ST elevation (NSTEMI) myocardial infarction: Secondary | ICD-10-CM | POA: Diagnosis not present

## 2020-05-23 DIAGNOSIS — Z8249 Family history of ischemic heart disease and other diseases of the circulatory system: Secondary | ICD-10-CM

## 2020-05-23 DIAGNOSIS — E785 Hyperlipidemia, unspecified: Secondary | ICD-10-CM | POA: Diagnosis present

## 2020-05-23 DIAGNOSIS — E1140 Type 2 diabetes mellitus with diabetic neuropathy, unspecified: Secondary | ICD-10-CM | POA: Diagnosis not present

## 2020-05-23 DIAGNOSIS — I152 Hypertension secondary to endocrine disorders: Secondary | ICD-10-CM | POA: Diagnosis not present

## 2020-05-23 DIAGNOSIS — E11649 Type 2 diabetes mellitus with hypoglycemia without coma: Secondary | ICD-10-CM | POA: Diagnosis not present

## 2020-05-23 DIAGNOSIS — E669 Obesity, unspecified: Secondary | ICD-10-CM | POA: Diagnosis present

## 2020-05-23 DIAGNOSIS — E1142 Type 2 diabetes mellitus with diabetic polyneuropathy: Secondary | ICD-10-CM | POA: Diagnosis not present

## 2020-05-23 DIAGNOSIS — I252 Old myocardial infarction: Secondary | ICD-10-CM

## 2020-05-23 DIAGNOSIS — Z7984 Long term (current) use of oral hypoglycemic drugs: Secondary | ICD-10-CM

## 2020-05-23 DIAGNOSIS — K219 Gastro-esophageal reflux disease without esophagitis: Secondary | ICD-10-CM | POA: Diagnosis present

## 2020-05-23 DIAGNOSIS — I129 Hypertensive chronic kidney disease with stage 1 through stage 4 chronic kidney disease, or unspecified chronic kidney disease: Secondary | ICD-10-CM | POA: Diagnosis not present

## 2020-05-23 DIAGNOSIS — E11621 Type 2 diabetes mellitus with foot ulcer: Secondary | ICD-10-CM | POA: Diagnosis present

## 2020-05-23 DIAGNOSIS — I499 Cardiac arrhythmia, unspecified: Secondary | ICD-10-CM | POA: Diagnosis not present

## 2020-05-23 DIAGNOSIS — Z79899 Other long term (current) drug therapy: Secondary | ICD-10-CM | POA: Diagnosis not present

## 2020-05-23 DIAGNOSIS — E1151 Type 2 diabetes mellitus with diabetic peripheral angiopathy without gangrene: Secondary | ICD-10-CM | POA: Diagnosis present

## 2020-05-23 DIAGNOSIS — Y832 Surgical operation with anastomosis, bypass or graft as the cause of abnormal reaction of the patient, or of later complication, without mention of misadventure at the time of the procedure: Secondary | ICD-10-CM | POA: Diagnosis present

## 2020-05-23 DIAGNOSIS — R6889 Other general symptoms and signs: Secondary | ICD-10-CM | POA: Diagnosis not present

## 2020-05-23 DIAGNOSIS — Z87891 Personal history of nicotine dependence: Secondary | ICD-10-CM

## 2020-05-23 DIAGNOSIS — L97519 Non-pressure chronic ulcer of other part of right foot with unspecified severity: Secondary | ICD-10-CM | POA: Diagnosis present

## 2020-05-23 DIAGNOSIS — Z6837 Body mass index (BMI) 37.0-37.9, adult: Secondary | ICD-10-CM

## 2020-05-23 DIAGNOSIS — E119 Type 2 diabetes mellitus without complications: Secondary | ICD-10-CM | POA: Diagnosis not present

## 2020-05-23 DIAGNOSIS — E1122 Type 2 diabetes mellitus with diabetic chronic kidney disease: Secondary | ICD-10-CM | POA: Diagnosis not present

## 2020-05-23 DIAGNOSIS — I480 Paroxysmal atrial fibrillation: Secondary | ICD-10-CM

## 2020-05-23 DIAGNOSIS — L97529 Non-pressure chronic ulcer of other part of left foot with unspecified severity: Secondary | ICD-10-CM | POA: Diagnosis present

## 2020-05-23 DIAGNOSIS — Z743 Need for continuous supervision: Secondary | ICD-10-CM | POA: Diagnosis not present

## 2020-05-23 DIAGNOSIS — Z8673 Personal history of transient ischemic attack (TIA), and cerebral infarction without residual deficits: Secondary | ICD-10-CM

## 2020-05-23 DIAGNOSIS — R079 Chest pain, unspecified: Secondary | ICD-10-CM | POA: Diagnosis not present

## 2020-05-23 DIAGNOSIS — E1159 Type 2 diabetes mellitus with other circulatory complications: Secondary | ICD-10-CM | POA: Diagnosis present

## 2020-05-23 DIAGNOSIS — Z7901 Long term (current) use of anticoagulants: Secondary | ICD-10-CM

## 2020-05-23 DIAGNOSIS — E66812 Obesity, class 2: Secondary | ICD-10-CM | POA: Diagnosis present

## 2020-05-23 DIAGNOSIS — I10 Essential (primary) hypertension: Secondary | ICD-10-CM | POA: Diagnosis not present

## 2020-05-23 HISTORY — DX: Other complications of anesthesia, initial encounter: T88.59XA

## 2020-05-23 LAB — CBC WITH DIFFERENTIAL/PLATELET
Abs Immature Granulocytes: 0.05 10*3/uL (ref 0.00–0.07)
Basophils Absolute: 0.1 10*3/uL (ref 0.0–0.1)
Basophils Relative: 1 %
Eosinophils Absolute: 0.2 10*3/uL (ref 0.0–0.5)
Eosinophils Relative: 2 %
HCT: 41.7 % (ref 39.0–52.0)
Hemoglobin: 12.9 g/dL — ABNORMAL LOW (ref 13.0–17.0)
Immature Granulocytes: 1 %
Lymphocytes Relative: 23 %
Lymphs Abs: 2.4 10*3/uL (ref 0.7–4.0)
MCH: 28.1 pg (ref 26.0–34.0)
MCHC: 30.9 g/dL (ref 30.0–36.0)
MCV: 90.8 fL (ref 80.0–100.0)
Monocytes Absolute: 1 10*3/uL (ref 0.1–1.0)
Monocytes Relative: 10 %
Neutro Abs: 6.7 10*3/uL (ref 1.7–7.7)
Neutrophils Relative %: 63 %
Platelets: 346 10*3/uL (ref 150–400)
RBC: 4.59 MIL/uL (ref 4.22–5.81)
RDW: 13.6 % (ref 11.5–15.5)
WBC: 10.4 10*3/uL (ref 4.0–10.5)
nRBC: 0 % (ref 0.0–0.2)

## 2020-05-23 LAB — LIPID PANEL
Cholesterol: 172 mg/dL (ref 0–200)
HDL: 21 mg/dL — ABNORMAL LOW (ref >40)
LDL Cholesterol: 75 mg/dL (ref 0–99)
Total CHOL/HDL Ratio: 8.2 RATIO
Triglycerides: 378 mg/dL — ABNORMAL HIGH (ref <150)
VLDL: 76 mg/dL — ABNORMAL HIGH (ref 0–40)

## 2020-05-23 LAB — TROPONIN I (HIGH SENSITIVITY)
Troponin I (High Sensitivity): 218 ng/L (ref <18)
Troponin I (High Sensitivity): 265 ng/L (ref <18)

## 2020-05-23 LAB — COMPREHENSIVE METABOLIC PANEL
ALT: 24 U/L (ref 0–44)
AST: 26 U/L (ref 15–41)
Albumin: 3.9 g/dL (ref 3.5–5.0)
Alkaline Phosphatase: 40 U/L (ref 38–126)
Anion gap: 12 (ref 5–15)
BUN: 23 mg/dL (ref 8–23)
CO2: 24 mmol/L (ref 22–32)
Calcium: 9.2 mg/dL (ref 8.9–10.3)
Chloride: 95 mmol/L — ABNORMAL LOW (ref 98–111)
Creatinine, Ser: 1.49 mg/dL — ABNORMAL HIGH (ref 0.61–1.24)
GFR, Estimated: 50 mL/min — ABNORMAL LOW (ref >60)
Glucose, Bld: 139 mg/dL — ABNORMAL HIGH (ref 70–99)
Potassium: 3.8 mmol/L (ref 3.5–5.1)
Sodium: 131 mmol/L — ABNORMAL LOW (ref 135–145)
Total Bilirubin: 0.8 mg/dL (ref 0.3–1.2)
Total Protein: 7.2 g/dL (ref 6.5–8.1)

## 2020-05-23 LAB — CBG MONITORING, ED
Glucose-Capillary: 126 mg/dL — ABNORMAL HIGH (ref 70–99)
Glucose-Capillary: 114 mg/dL — ABNORMAL HIGH (ref 70–99)

## 2020-05-23 LAB — HEMOGLOBIN A1C
Hgb A1c MFr Bld: 6.7 % — ABNORMAL HIGH (ref 4.8–5.6)
Mean Plasma Glucose: 145.59 mg/dL

## 2020-05-23 LAB — HIV ANTIBODY (ROUTINE TESTING W REFLEX): HIV Screen 4th Generation wRfx: NONREACTIVE

## 2020-05-23 LAB — RESP PANEL BY RT-PCR (FLU A&B, COVID) ARPGX2
Influenza A by PCR: NEGATIVE
Influenza B by PCR: NEGATIVE
SARS Coronavirus 2 by RT PCR: NEGATIVE

## 2020-05-23 LAB — GLUCOSE, CAPILLARY
Glucose-Capillary: 72 mg/dL (ref 70–99)
Glucose-Capillary: 112 mg/dL — ABNORMAL HIGH (ref 70–99)

## 2020-05-23 LAB — PROTIME-INR
INR: 1.3 — ABNORMAL HIGH (ref 0.8–1.2)
Prothrombin Time: 15.7 seconds — ABNORMAL HIGH (ref 11.4–15.2)

## 2020-05-23 LAB — APTT
aPTT: 47 seconds — ABNORMAL HIGH (ref 24–36)
aPTT: 68 seconds — ABNORMAL HIGH (ref 24–36)

## 2020-05-23 LAB — HEPARIN LEVEL (UNFRACTIONATED): Heparin Unfractionated: >2.2 IU/mL — ABNORMAL HIGH (ref 0.30–0.70)

## 2020-05-23 MED ORDER — HYDROCODONE-ACETAMINOPHEN 10-325 MG PO TABS
1.0000 | ORAL_TABLET | Freq: Four times a day (QID) | ORAL | Status: DC | PRN
  Administered 2020-05-23 – 2020-05-24 (×3): 1 via ORAL
  Filled 2020-05-23 (×3): qty 1

## 2020-05-23 MED ORDER — PANTOPRAZOLE SODIUM 40 MG PO TBEC
40.0000 mg | DELAYED_RELEASE_TABLET | Freq: Every day | ORAL | Status: DC
  Administered 2020-05-23 – 2020-05-25 (×3): 40 mg via ORAL
  Filled 2020-05-23 (×3): qty 1

## 2020-05-23 MED ORDER — ISOSORBIDE MONONITRATE ER 30 MG PO TB24
30.0000 mg | ORAL_TABLET | Freq: Every day | ORAL | Status: DC
  Filled 2020-05-23: qty 1

## 2020-05-23 MED ORDER — CLOPIDOGREL BISULFATE 75 MG PO TABS
75.0000 mg | ORAL_TABLET | Freq: Every day | ORAL | Status: DC
  Administered 2020-05-23 – 2020-05-25 (×3): 75 mg via ORAL
  Filled 2020-05-23 (×4): qty 1

## 2020-05-23 MED ORDER — ASPIRIN 325 MG PO TABS
ORAL_TABLET | ORAL | Status: AC
  Filled 2020-05-23: qty 1

## 2020-05-23 MED ORDER — ALBUTEROL SULFATE HFA 108 (90 BASE) MCG/ACT IN AERS: 2.0000 | INHALATION_SPRAY | Freq: Four times a day (QID) | RESPIRATORY_TRACT | Status: DC | PRN

## 2020-05-23 MED ORDER — IRBESARTAN 75 MG PO TABS
75.0000 mg | ORAL_TABLET | Freq: Every day | ORAL | Status: DC
  Administered 2020-05-23 – 2020-05-25 (×3): 75 mg via ORAL
  Filled 2020-05-23 (×3): qty 1

## 2020-05-23 MED ORDER — INSULIN GLARGINE 100 UNIT/ML ~~LOC~~ SOLN
80.0000 [IU] | Freq: Two times a day (BID) | SUBCUTANEOUS | Status: DC
  Administered 2020-05-23 – 2020-05-24 (×4): 80 [IU] via SUBCUTANEOUS
  Filled 2020-05-23 (×7): qty 0.8

## 2020-05-23 MED ORDER — GABAPENTIN 300 MG PO CAPS
900.0000 mg | ORAL_CAPSULE | Freq: Two times a day (BID) | ORAL | Status: DC
  Administered 2020-05-23 – 2020-05-25 (×5): 900 mg via ORAL
  Filled 2020-05-23 (×5): qty 3

## 2020-05-23 MED ORDER — HEPARIN (PORCINE) 25000 UT/250ML-% IV SOLN
1600.0000 [IU]/h | INTRAVENOUS | Status: DC
  Administered 2020-05-23 (×2): 1600 [IU]/h via INTRAVENOUS
  Filled 2020-05-23 (×3): qty 250

## 2020-05-23 MED ORDER — FUROSEMIDE 20 MG PO TABS
20.0000 mg | ORAL_TABLET | Freq: Every day | ORAL | Status: DC
  Administered 2020-05-23 – 2020-05-25 (×3): 20 mg via ORAL
  Filled 2020-05-23 (×3): qty 1

## 2020-05-23 MED ORDER — ACETAMINOPHEN 325 MG PO TABS: 650.0000 mg | ORAL_TABLET | ORAL | Status: DC | PRN

## 2020-05-23 MED ORDER — METOPROLOL SUCCINATE ER 100 MG PO TB24
100.0000 mg | ORAL_TABLET | Freq: Every day | ORAL | Status: DC
  Administered 2020-05-23 – 2020-05-25 (×3): 100 mg via ORAL
  Filled 2020-05-23 (×3): qty 1

## 2020-05-23 MED ORDER — ASPIRIN 81 MG PO CHEW
324.0000 mg | CHEWABLE_TABLET | Freq: Once | ORAL | Status: AC
  Administered 2020-05-23: 324 mg via ORAL

## 2020-05-23 MED ORDER — SODIUM CHLORIDE 0.9 % IV SOLN: INTRAVENOUS | Status: DC

## 2020-05-23 MED ORDER — VITAMIN B-12 1000 MCG PO TABS
1000.0000 ug | ORAL_TABLET | Freq: Every day | ORAL | Status: DC
  Administered 2020-05-23 – 2020-05-25 (×3): 1000 ug via ORAL
  Filled 2020-05-23 (×3): qty 1

## 2020-05-23 MED ORDER — INSULIN ASPART 100 UNIT/ML ~~LOC~~ SOLN: 15.0000 [IU] | Freq: Three times a day (TID) | SUBCUTANEOUS | Status: DC

## 2020-05-23 MED ORDER — NITROGLYCERIN 0.4 MG SL SUBL: 0.4000 mg | SUBLINGUAL_TABLET | SUBLINGUAL | Status: DC | PRN

## 2020-05-23 MED ORDER — DAPAGLIFLOZIN PROPANEDIOL 5 MG PO TABS
5.0000 mg | ORAL_TABLET | Freq: Every day | ORAL | Status: DC
  Administered 2020-05-23 – 2020-05-25 (×3): 5 mg via ORAL
  Filled 2020-05-23 (×3): qty 1

## 2020-05-23 MED ORDER — ONDANSETRON HCL 4 MG/2ML IJ SOLN: 4.0000 mg | Freq: Four times a day (QID) | INTRAMUSCULAR | Status: DC | PRN

## 2020-05-23 MED ORDER — DULOXETINE HCL 30 MG PO CPEP
30.0000 mg | ORAL_CAPSULE | Freq: Every day | ORAL | Status: DC
  Administered 2020-05-23 – 2020-05-25 (×3): 30 mg via ORAL
  Filled 2020-05-23 (×3): qty 1

## 2020-05-23 MED ORDER — NITROGLYCERIN IN D5W 200-5 MCG/ML-% IV SOLN
0.0000 ug/min | INTRAVENOUS | Status: DC
  Administered 2020-05-23: 5 ug/min via INTRAVENOUS
  Filled 2020-05-23: qty 250

## 2020-05-23 MED ORDER — FENOFIBRATE 160 MG PO TABS
160.0000 mg | ORAL_TABLET | Freq: Every day | ORAL | Status: DC
  Administered 2020-05-23 – 2020-05-24 (×2): 160 mg via ORAL
  Filled 2020-05-23 (×3): qty 1

## 2020-05-23 NOTE — Progress Notes (Addendum)
ANTICOAGULATION CONSULT NOTE - Follow Up Consult  Pharmacy Consult for Heparin Indication: chest pain/ACS with h/o afib  Allergies  Allergen Reactions  . Shellfish Allergy Anaphylaxis and Other (See Comments)    Tongue swelling, hives   . Sulfa Antibiotics Anaphylaxis and Rash    Tongue swelling, hives  . Ace Inhibitors Other (See Comments) and Cough    CKD, renal failure   . Invokana [Canagliflozin] Other (See Comments)    Syncope / dehydration  . Lisinopril Cough  . Metformin And Related Itching  . Pravastatin Sodium Other (See Comments)    myalgias  . Milk-Related Compounds     Ties stomach in knots   . Crestor [Rosuvastatin] Other (See Comments)    Myalgias   . Fenofibrate Other (See Comments)    Body aches - pt currently taking isnt sure if its causing any pain  . Horse-Derived Products Rash  . Iodine Other (See Comments)    ????. PATIENT SAYS HE DID FINE WITH LAST CT WITH CONTRAST** patient had IV contrast on 06/21/2018 without pre meds without any issues. Patient had allergic reaction to Shellfish 30 years ago-not to IV contrast.   . Lexapro [Escitalopram Oxalate] Other (See Comments)    Buzzing in ears,headache, felt like a zombie  . Lipitor [Atorvastatin] Other (See Comments)    myalgias  . Livalo [Pitavastatin] Other (See Comments)    Myalgias   . Repatha [Evolocumab] Other (See Comments)    Myalgias, flu like sx  . Tape Rash    Patient Measurements: Height: 6\' 2"  (188 cm) Weight: 131.5 kg (290 lb) IBW/kg (Calculated) : 82.2 Heparin Dosing Weight:   Vital Signs: Temp: 97.7 F (36.5 C) (01/06 1343) Temp Source: Oral (01/06 1343) BP: 128/93 (01/06 1343) Pulse Rate: 76 (01/06 1343)  Labs: Recent Labs    05/23/20 0051 05/23/20 0303 05/23/20 1436  HGB 12.9*  --   --   HCT 41.7  --   --   PLT 346  --   --   APTT 47*  --  68*  LABPROT 15.7*  --   --   INR 1.3*  --   --   HEPARINUNFRC  --   --  >2.20*  CREATININE 1.49*  --   --   TROPONINIHS 218*  265*  --     Estimated Creatinine Clearance: 67.4 mL/min (A) (by C-G formula based on SCr of 1.49 mg/dL (H)).  Assessment: Anticoag: heparin for CP. On Eliquis PTA for afib - last dose 1/5 2030. Baseline PTT 47 sec.  - aPTT 68 in goal, Hep level >2.2 due to Eliquis interaction  Goal of Therapy:  aPTT 66-102 seconds Monitor platelets by anticoagulation protocol: Yes   Plan:  Con't IV heparin 1600 units/hr Hep level, aPTT, and CBC in AM   Unknown Schleyer S. 2031, PharmD, BCPS Clinical Staff Pharmacist [email or web address] Merilynn [place], [place] Stilling[place] 05/23/2020,4:31 PM

## 2020-05-23 NOTE — Progress Notes (Signed)
ANTICOAGULATION CONSULT NOTE - Initial Consult  Pharmacy Consult for Heparin Indication: chest pain/ACS  Allergies  Allergen Reactions  . Shellfish Allergy Anaphylaxis and Other (See Comments)    Tongue swelling, hives   . Sulfa Antibiotics Anaphylaxis and Rash    Tongue swelling, hives  . Ace Inhibitors Other (See Comments) and Cough    CKD, renal failure   . Invokana [Canagliflozin] Other (See Comments)    Syncope / dehydration  . Lisinopril Cough  . Metformin And Related Itching  . Pravastatin Sodium Other (See Comments)    myalgias  . Milk-Related Compounds     Ties stomach in knots   . Crestor [Rosuvastatin] Other (See Comments)    Myalgias   . Fenofibrate Other (See Comments)    Body aches - pt currently taking isnt sure if its causing any pain  . Horse-Derived Products Rash  . Iodine Other (See Comments)    ????. PATIENT SAYS HE DID FINE WITH LAST CT WITH CONTRAST** patient had IV contrast on 06/21/2018 without pre meds without any issues. Patient had allergic reaction to Shellfish 30 years ago-not to IV contrast.   . Lexapro [Escitalopram Oxalate] Other (See Comments)    Buzzing in ears,headache, felt like a zombie  . Lipitor [Atorvastatin] Other (See Comments)    myalgias  . Livalo [Pitavastatin] Other (See Comments)    Myalgias   . Repatha [Evolocumab] Other (See Comments)    Myalgias, flu like sx  . Tape Rash    Patient Measurements: Height: 6\' 2"  (188 cm) Weight: 131.5 kg (290 lb) IBW/kg (Calculated) : 82.2 Heparin Dosing Weight: 110 kg  Vital Signs: Temp: 98.3 F (36.8 C) (01/06 0053) Temp Source: Oral (01/06 0053) BP: 141/66 (01/06 0345) Pulse Rate: 90 (01/06 0345)  Labs: Recent Labs    05/20/20 1038 05/23/20 0051  HGB 14.1 12.9*  HCT 41.9 41.7  PLT 365 346  APTT  --  47*  LABPROT  --  15.7*  INR  --  1.3*  CREATININE 1.42* 1.49*  TROPONINIHS  --  218*    Estimated Creatinine Clearance: 67.4 mL/min (A) (by C-G formula based on SCr of  1.49 mg/dL (H)).   Medical History: Past Medical History:  Diagnosis Date  . Anxiety   . Arthritis   . CAD (coronary artery disease)    a. 2010: DES to CTO of RCA. EF 55% b. 07/2016: cath showing total occlusion within previously placed RCA stent (collaterals present), severe stenosis along LCx and OM1 (treated with 2 overlapping DES). c. repeat cath in 01/2018 showing patent stents along LCx and OM with CTO of D2, CTO of distal LCx, and CTO of RCA with collaterals present overall unchanged since 2018 with medical management recom  . Cellulitis and abscess rt groin   . Depression   . Diabetes mellitus   . Disorders of iron metabolism   . GERD (gastroesophageal reflux disease)   . Hyperlipidemia   . Hypertension   . Low serum testosterone level   . Medically noncompliant   . Myocardial infarction [facility])     Medications:  See electronic med rec  Assessment: 70 y.o. presents with CP - h/o multiple stents. Pharmacy consulted to dose heparin. On Eliquis PTA for afib - last dose 1/5 2030. Baseline PTT 47 sec. Eliquis will likely be affecting heparin levels so will utilize PTT until levels correlating.   Goal of Therapy:  Heparin level 0.3-0.7 units/ml, PTT 66-102 sec Monitor platelets by anticoagulation protocol: Yes   Plan:  At 0830, start heparin at 1600 units/hr. No bolus. Will f/u heparin level and PTT in 6 hours Daily heparin level, PTT, and CBC  Sherlon Handing, PharmD, BCPS Please see amion for complete clinical pharmacist phone list 05/23/2020,4:15 AM

## 2020-05-23 NOTE — ED Notes (Signed)
Pts chest tightness now 2/10 after increase in Nitro drip. Increased to 

## 2020-05-23 NOTE — ED Notes (Signed)
Lunch Tray Ordered @ 1114.  

## 2020-05-23 NOTE — Progress Notes (Signed)
 Progress Note  Patient Name: [name] [name] Date of Encounter: 05/23/2020  [facility] Cardiologist: Branch, Jonathan, MD   Subjective   Currently angina free at rest, on intravenous nitroglycerin. Feels that he has had increasing episodes of irregular rapid rhythm over the last few weeks. However, ECG performed on January 3 in PCP office and EKG performed today both show marked ST segment changes. Mild, adynamic increase in high-sensitivity troponin in 260 range. ECG today is clearly in sinus rhythm, it is possible that the ECG performed on January 3 shows atrial flutter with 2: 1 AV block or ectopic atrial tachycardia versus sinus tachycardia.  Inpatient Medications    Scheduled Meds: . clopidogrel  75 mg Oral Daily  . dapagliflozin propanediol  5 mg Oral QAC breakfast  . DULoxetine  30 mg Oral Daily  . fenofibrate  160 mg Oral QHS  . furosemide  20 mg Oral Daily  . gabapentin  900 mg Oral BID  . insulin aspart  15 Units Subcutaneous TID WC  . insulin glargine  80 Units Subcutaneous BID  . irbesartan  75 mg Oral Daily  . isosorbide mononitrate  30 mg Oral Daily  . metoprolol succinate  100 mg Oral Daily  . pantoprazole  40 mg Oral Daily  . vitamin B-12  1,000 mcg Oral Daily   Continuous Infusions: . sodium chloride 20 mL/hr at 05/23/20 0057  . heparin 1,600 Units/hr (05/23/20 0849)  . nitroGLYCERIN 5 mcg/min (05/23/20 0239)   PRN Meds: acetaminophen, albuterol, HYDROcodone-acetaminophen, nitroGLYCERIN, ondansetron (ZOFRAN) IV   Vital Signs    Vitals:   05/23/20 0600 05/23/20 0630 05/23/20 0700 05/23/20 0805  BP: 139/82 127/75 (!) 153/91   Pulse: 83 82 80   Resp: 17 (!) 22    Temp:    98.7 F (37.1 C)  TempSrc:    Oral  SpO2: 93% 94% 96%   Weight:      Height:       No intake or output data in the 24 hours ending 05/23/20 0911 Last 3 Weights 05/23/2020 05/20/2020 03/21/2020  Weight (lbs) 290 lb 298 lb 9.6 oz 300 lb  Weight (kg) 131.543 kg 135.444 kg 136.079 kg   Some encounter information is confidential and restricted. Go to Review Flowsheets activity to see all data.      Telemetry    Normal sinus rhythm- Personally Reviewed  ECG    Normal sinus rhythm, ST segment depression lateral leads; ECG from 05/20/2020 shows even more marked ST segment depression in inferior and lateral leads.- Personally Reviewed  Physical Exam  Obese GEN: No acute distress.   Neck: No JVD Cardiac: RRR, no murmurs, rubs.  S4 gallop present Respiratory: Clear to auscultation bilaterally. GI: Soft, nontender, non-distended  MS: No edema; No deformity. Neuro:  Nonfocal  Psych: Normal affect   Labs    High Sensitivity Troponin:   Recent Labs  Lab 05/23/20 0051 05/23/20 0303  TROPONINIHS 218* 265*      Chemistry Recent Labs  Lab 05/20/20 1038 05/23/20 0051  NA 138 131*  K 4.5 3.8  CL 97 95*  CO2 26 24  GLUCOSE 137* 139*  BUN 24 23  CREATININE 1.42* 1.49*  CALCIUM 10.1 9.2  PROT 7.1 7.2  ALBUMIN 4.3 3.9  AST 22 26  ALT 18 24  ALKPHOS 51 40  BILITOT 0.5 0.8  GFRNONAA 50* 50*  GFRAA 58*  --   ANIONGAP  --  12     Hematology Recent Labs    Lab 05/20/20 1038 05/23/20 0051  WBC 7.9 10.4  RBC 4.81 4.59  HGB 14.1 12.9*  HCT 41.9 41.7  MCV 87 90.8  MCH 29.3 28.1  MCHC 33.7 30.9  RDW 13.3 13.6  PLT 365 346    BNPNo results for input(s): BNP, PROBNP in the last 168 hours.   DDimer No results for input(s): DDIMER in the last 168 hours.   Radiology    DG Chest Port 1 View  Result Date: 05/23/2020 CLINICAL DATA:  Chest pain since 10:30 p.m. yesterday, history of coronary artery disease EXAM: PORTABLE CHEST 1 VIEW COMPARISON:  01/18/2018 FINDINGS: Single frontal view of the chest demonstrates stable enlarged cardiac silhouette. No airspace disease, effusion, or pneumothorax. No acute bony abnormalities. IMPRESSION: 1. Stable exam, no acute process. Electronically Signed   By: Sharlet Salina M.D.   On: 05/23/2020 01:41    Cardiac Studies    Cardiac catheterization 01/19/2018   Prox LAD lesion is 40% stenosed.  Ost 1st Diag lesion is 40% stenosed.  Ost 2nd Diag to 2nd Diag lesion is 100% stenosed.  Mid Cx to Dist Cx lesion is 100% stenosed.  Prox RCA to Mid RCA lesion is 90% stenosed.  Prox RCA lesion is 99% stenosed.  Previously placed Ost 1st Mrg to 1st Mrg stent (unknown type) is widely patent.  Previously placed Prox Cx stent (unknown type) is widely patent.   Significant three-vessel coronary obstructive disease with previously noted 40% proximal LAD stenosis, 40% stenosis in the inferior branch of the first diagonal vessel with total occlusion of the second diagonal vessel with retrograde collateralization to this diagonal vessel;   Small normal ramus intermediate vessel;  Widely patent stent extending from the proximal circumflex into the OM1 vessel without stenosis, with old total occlusion of the mid distal circumflex after the second marginal vessel which is collateralized from the LAD and RCA;  Old subtotal/ total mid RCA stenosis at the site of prior stenting with extensive left-to-right collaterals as well as antegrade flow distally with competitive filling due to collateral flow and collateralization from the SA nodal artery supplying the distal circumflex.  LVEDP 15 mm Hg.   RECOMMENDATION: Increase medical therapy.  Present catheterization is essentially unchanged from the prior study of March 2018.  Aggressive lipid-lowering therapy with target LDL less than 70. oPTIMAL blood pressure control with target blood pressure less than 130/80. consider evaluation for sleep apnea in this patient with atrial fibrillation.  Recommend to resume Apixaban, at currently prescribed dose and frequency, on 01/20/18.  Recommend concurrent antiplatelet therapy of Clopidogrel 75mg  daily.  Echocardiogram 11/01/2019  1. Inferior basal akinesis . Left ventricular ejection fraction, by  estimation, is 50 to 55%.  The left ventricle has low normal function. The  left ventricle demonstrates regional wall motion abnormalities (see  scoring diagram/findings for description).  There is mild left ventricular hypertrophy. Left ventricular diastolic  parameters were normal.  2. Right ventricular systolic function is normal. The right ventricular  size is normal.  3. The mitral valve is normal in structure. Trivial mitral valve  regurgitation. No evidence of mitral stenosis.  4. Calcification of the non coronary cusp . The aortic valve is  tricuspid. Aortic valve regurgitation is not visualized. Mild to moderate  aortic valve sclerosis/calcification is present, without any evidence of  aortic stenosis.  5. The inferior vena cava is dilated in size with >50% respiratory  variability, suggesting right atrial pressure of 8 mmHg.      Patient Profile  70 y.o. male CAD requiring previous PCI to RCA and LCx, chronic total occlusion of the RCA, borderline LVEF due to inferior wall motion normality, extensive PAD with previous stent to the right lower extremity, paroxysmal atrial fibrillation, controlled type 2 diabetes mellitus on insulin, CKD 3A, severe dyslipidemia with fair LDL cholesterol (not on statin) presents with unstable anginal symptoms, ECG changes, mildly abnormal high-sensitivity troponin  Assessment & Plan    1.  Unstable angina/NSTEMI: Suspect recurrent stenosis in the left circumflex distribution, high risk ECG and cardiac enzymes.  He will require cardiac catheterization and coronary angiography.  Or, he is currently chest pain-free and he received apixaban about 12 hours ago.  Will delay the catheterization procedure until tomorrow unless he has recurrent unrelenting angina or worsening ECG changes. This procedure has been fully reviewed with the patient and written informed consent has been obtained.  He is already on statin and antiplatelet therapy and beta-blockers.  Continue intravenous  nitroglycerin. 2. Parox AFib: He notes increased episodes of palpitations with tachycardia in the 120s.  The ECG from January 3 shows regular rhythm with coherent atrial activity that may represent ectopic atrial tachycardia or atrial flutter with 2: 1 AV block.  Stop Eliquis and start intravenous heparin. 3. DM: Hemoglobin A1c less than 7% SGLT2 inhibitor. 4. PAD: He has several small nonhealing ulcer lesions on the anterior left shin and a 1 cm dry gray ulcer on the tip of the right great toe.  He has good capillary refill and warm tips of his toes.  He is seeing Dr. [name], but lower extremity revascularization will have to wait until we clarify his coronary status. 5. HLP: He has a severely decreased HDL cholesterol, residual hypertriglyceridemia despite treatment with fenofibrate, his LDL is close to target at 75.  Intolerant to multiple statins (documented intolerable myalgias on pravastatin, rosuvastatin, pitavastatin and atorvastatin) consider adding ezetimibe at least, but should probably be on a more potent agent such as Repatha. 6. HTN: Beta-blockers and RAAS inhibitors preferred, but hold irbesartan on day of cardiac catheterization. 7.  Severe obesity (BMI 37) with multiple comorbid conditions     For questions or updates, please contact [place] Please consult [email or web address] for contact info under        Signed, Sanda Klein, MD  05/23/2020, 9:11 AM

## 2020-05-23 NOTE — H&P (View-Only) (Signed)
Progress Note  Patient Name: [name] [name] Date of Encounter: 05/23/2020  [facility] Cardiologist: Dina Rich, MD   Subjective   Currently angina free at rest, on intravenous nitroglycerin. Feels that he has had increasing episodes of irregular rapid rhythm over the last few weeks. However, ECG performed on January 3 in PCP office and EKG performed today both show marked ST segment changes. Mild, adynamic increase in high-sensitivity troponin in 260 range. ECG today is clearly in sinus rhythm, it is possible that the ECG performed on January 3 shows atrial flutter with 2: 1 AV block or ectopic atrial tachycardia versus sinus tachycardia.  Inpatient Medications    Scheduled Meds: . clopidogrel  75 mg Oral Daily  . dapagliflozin propanediol  5 mg Oral QAC breakfast  . DULoxetine  30 mg Oral Daily  . fenofibrate  160 mg Oral QHS  . furosemide  20 mg Oral Daily  . gabapentin  900 mg Oral BID  . insulin aspart  15 Units Subcutaneous TID WC  . insulin glargine  80 Units Subcutaneous BID  . irbesartan  75 mg Oral Daily  . isosorbide mononitrate  30 mg Oral Daily  . metoprolol succinate  100 mg Oral Daily  . pantoprazole  40 mg Oral Daily  . vitamin B-12  1,000 mcg Oral Daily   Continuous Infusions: . sodium chloride 20 mL/hr at 05/23/20 0057  . heparin 1,600 Units/hr (05/23/20 0849)  . nitroGLYCERIN 5 mcg/min (05/23/20 0239)   PRN Meds: acetaminophen, albuterol, HYDROcodone-acetaminophen, nitroGLYCERIN, ondansetron (ZOFRAN) IV   Vital Signs    Vitals:   05/23/20 0600 05/23/20 0630 05/23/20 0700 05/23/20 0805  BP: 139/82 127/75 (!) 153/91   Pulse: 83 82 80   Resp: 17 (!) 22    Temp:    98.7 F (37.1 C)  TempSrc:    Oral  SpO2: 93% 94% 96%   Weight:      Height:       No intake or output data in the 24 hours ending 05/23/20 0911 Last 3 Weights 05/23/2020 05/20/2020 03/21/2020  Weight (lbs) 290 lb 298 lb 9.6 oz 300 lb  Weight (kg) 131.543 kg 135.444 kg 136.079 kg   Some encounter information is confidential and restricted. Go to Review Flowsheets activity to see all data.      Telemetry    Normal sinus rhythm- Personally Reviewed  ECG    Normal sinus rhythm, ST segment depression lateral leads; ECG from 05/20/2020 shows even more marked ST segment depression in inferior and lateral leads.- Personally Reviewed  Physical Exam  Obese GEN: No acute distress.   Neck: No JVD Cardiac: RRR, no murmurs, rubs.  S4 gallop present Respiratory: Clear to auscultation bilaterally. GI: Soft, nontender, non-distended  MS: No edema; No deformity. Neuro:  Nonfocal  Psych: Normal affect   Labs    High Sensitivity Troponin:   Recent Labs  Lab 05/23/20 0051 05/23/20 0303  TROPONINIHS 218* 265*      Chemistry Recent Labs  Lab 05/20/20 1038 05/23/20 0051  NA 138 131*  K 4.5 3.8  CL 97 95*  CO2 26 24  GLUCOSE 137* 139*  BUN 24 23  CREATININE 1.42* 1.49*  CALCIUM 10.1 9.2  PROT 7.1 7.2  ALBUMIN 4.3 3.9  AST 22 26  ALT 18 24  ALKPHOS 51 40  BILITOT 0.5 0.8  GFRNONAA 50* 50*  GFRAA 58*  --   ANIONGAP  --  12     Hematology Recent Labs  Lab 05/20/20 1038 05/23/20 0051  WBC 7.9 10.4  RBC 4.81 4.59  HGB 14.1 12.9*  HCT 41.9 41.7  MCV 87 90.8  MCH 29.3 28.1  MCHC 33.7 30.9  RDW 13.3 13.6  PLT 365 346    BNPNo results for input(s): BNP, PROBNP in the last 168 hours.   DDimer No results for input(s): DDIMER in the last 168 hours.   Radiology    DG Chest Port 1 View  Result Date: 05/23/2020 CLINICAL DATA:  Chest pain since 10:30 p.m. yesterday, history of coronary artery disease EXAM: PORTABLE CHEST 1 VIEW COMPARISON:  01/18/2018 FINDINGS: Single frontal view of the chest demonstrates stable enlarged cardiac silhouette. No airspace disease, effusion, or pneumothorax. No acute bony abnormalities. IMPRESSION: 1. Stable exam, no acute process. Electronically Signed   By: Sharlet Salina M.D.   On: 05/23/2020 01:41    Cardiac Studies    Cardiac catheterization 01/19/2018   Prox LAD lesion is 40% stenosed.  Ost 1st Diag lesion is 40% stenosed.  Ost 2nd Diag to 2nd Diag lesion is 100% stenosed.  Mid Cx to Dist Cx lesion is 100% stenosed.  Prox RCA to Mid RCA lesion is 90% stenosed.  Prox RCA lesion is 99% stenosed.  Previously placed Ost 1st Mrg to 1st Mrg stent (unknown type) is widely patent.  Previously placed Prox Cx stent (unknown type) is widely patent.   Significant three-vessel coronary obstructive disease with previously noted 40% proximal LAD stenosis, 40% stenosis in the inferior branch of the first diagonal vessel with total occlusion of the second diagonal vessel with retrograde collateralization to this diagonal vessel;   Small normal ramus intermediate vessel;  Widely patent stent extending from the proximal circumflex into the OM1 vessel without stenosis, with old total occlusion of the mid distal circumflex after the second marginal vessel which is collateralized from the LAD and RCA;  Old subtotal/ total mid RCA stenosis at the site of prior stenting with extensive left-to-right collaterals as well as antegrade flow distally with competitive filling due to collateral flow and collateralization from the SA nodal artery supplying the distal circumflex.  LVEDP 15 mm Hg.   RECOMMENDATION: Increase medical therapy.  Present catheterization is essentially unchanged from the prior study of March 2018.  Aggressive lipid-lowering therapy with target LDL less than 70. oPTIMAL blood pressure control with target blood pressure less than 130/80. consider evaluation for sleep apnea in this patient with atrial fibrillation.  Recommend to resume Apixaban, at currently prescribed dose and frequency, on 01/20/18.  Recommend concurrent antiplatelet therapy of Clopidogrel 75mg  daily.  Echocardiogram 11/01/2019  1. Inferior basal akinesis . Left ventricular ejection fraction, by  estimation, is 50 to 55%.  The left ventricle has low normal function. The  left ventricle demonstrates regional wall motion abnormalities (see  scoring diagram/findings for description).  There is mild left ventricular hypertrophy. Left ventricular diastolic  parameters were normal.  2. Right ventricular systolic function is normal. The right ventricular  size is normal.  3. The mitral valve is normal in structure. Trivial mitral valve  regurgitation. No evidence of mitral stenosis.  4. Calcification of the non coronary cusp . The aortic valve is  tricuspid. Aortic valve regurgitation is not visualized. Mild to moderate  aortic valve sclerosis/calcification is present, without any evidence of  aortic stenosis.  5. The inferior vena cava is dilated in size with >50% respiratory  variability, suggesting right atrial pressure of 8 mmHg.      Patient Profile  70 y.o. male CAD requiring previous PCI to RCA and LCx, chronic total occlusion of the RCA, borderline LVEF due to inferior wall motion normality, extensive PAD with previous stent to the right lower extremity, paroxysmal atrial fibrillation, controlled type 2 diabetes mellitus on insulin, CKD 3A, severe dyslipidemia with fair LDL cholesterol (not on statin) presents with unstable anginal symptoms, ECG changes, mildly abnormal high-sensitivity troponin  Assessment & Plan    1.  Unstable angina/NSTEMI: Suspect recurrent stenosis in the left circumflex distribution, high risk ECG and cardiac enzymes.  He will require cardiac catheterization and coronary angiography.  Or, he is currently chest pain-free and he received apixaban about 12 hours ago.  Will delay the catheterization procedure until tomorrow unless he has recurrent unrelenting angina or worsening ECG changes. This procedure has been fully reviewed with the patient and written informed consent has been obtained.  He is already on statin and antiplatelet therapy and beta-blockers.  Continue intravenous  nitroglycerin. 2. Parox AFib: He notes increased episodes of palpitations with tachycardia in the 120s.  The ECG from January 3 shows regular rhythm with coherent atrial activity that may represent ectopic atrial tachycardia or atrial flutter with 2: 1 AV block.  Stop Eliquis and start intravenous heparin. 3. DM: Hemoglobin A1c less than 7% SGLT2 inhibitor. 4. PAD: He has several small nonhealing ulcer lesions on the anterior left shin and a 1 cm dry gray ulcer on the tip of the right great toe.  He has good capillary refill and warm tips of his toes.  He is seeing Dr. [name], but lower extremity revascularization will have to wait until we clarify his coronary status. 5. HLP: He has a severely decreased HDL cholesterol, residual hypertriglyceridemia despite treatment with fenofibrate, his LDL is close to target at 75.  Intolerant to multiple statins (documented intolerable myalgias on pravastatin, rosuvastatin, pitavastatin and atorvastatin) consider adding ezetimibe at least, but should probably be on a more potent agent such as Repatha. 6. HTN: Beta-blockers and RAAS inhibitors preferred, but hold irbesartan on day of cardiac catheterization. 7.  Severe obesity (BMI 37) with multiple comorbid conditions     For questions or updates, please contact [place] Please consult [email or web address] for contact info under        Signed, Sanda Klein, MD  05/23/2020, 9:11 AM

## 2020-05-23 NOTE — ED Provider Notes (Signed)
[facility] DEPARTMENT Provider Note   CSN: [id number] Arrival date & time: 05/23/20  0046     History Chief Complaint  Patient presents with  . Code STEMI    Holdan A Yeley is a 70 y.o. male.  HPI     This is a 70 year old male with a history of coronary artery disease with multiple stents placed, diabetes, hypertension, hyperlipidemia who presents with chest pain.  Patient presents as a code STEMI by [facility].  Onset of chest pain around 10:30 PM.  He reports that it is anterior and radiates into the left arm.  He describes as both sharp and pressure.  He states it is similar to prior MIs.  No exertional nature to the symptoms.  Denies diaphoresis or shortness of breath.  He had an episode similar to this 2 days ago for which EMS was called.  He was noted to be in atrial fibrillation at that time.  He declined transport.  Known history of atrial fibrillation for which he takes Eliquis and Plavix.  Currently he rates his pain at 4 out of 10.  He had 5 nitroglycerin in route.  Was not given aspirin for stated allergy.  He does not have a true allergy but reports that it makes him jittery.  Review of patient chart, last cardiac catheterization 2019.  Medical management recommended as it was unchanged from catheterization in 2018.  Past Medical History:  Diagnosis Date  . Anxiety   . Arthritis   . CAD (coronary artery disease)    a. 2010: DES to CTO of RCA. EF 55% b. 07/2016: cath showing total occlusion within previously placed RCA stent (collaterals present), severe stenosis along LCx and OM1 (treated with 2 overlapping DES). c. repeat cath in 01/2018 showing patent stents along LCx and OM with CTO of D2, CTO of distal LCx, and CTO of RCA with collaterals present overall unchanged since 2018 with medical management recom  . Cellulitis and abscess rt groin   . Depression   . Diabetes mellitus   . Disorders of iron metabolism   . GERD (gastroesophageal  reflux disease)   . Hyperlipidemia   . Hypertension   . Low serum testosterone level   . Medically noncompliant   . Myocardial infarction [facility])     Patient Active Problem List   Diagnosis Date Noted  . NSTEMI (non-ST elevated myocardial infarction) ([place]) 05/23/2020  . Orthostatic hypotension 01/01/2020  . Drug-induced myopathy 12/06/2019  . Diarrhea 05/10/2018  . A-fib ([place]) 03/29/2018  . Lacunar infarction ([place]) 03/29/2018  . Unstable angina ([place])   . Chest pain 01/18/2018  . CHF (congestive heart failure) ([place]) 10/21/2017  . Anxiety 03/09/2017  . Constipation 11/26/2016  . History of non-ST elevation myocardial infarction (NSTEMI) 07/29/2016  . Diabetes ([place]) 05/25/2016  . Occlusion and stenosis of vertebral artery   . Acute renal failure ([place]) 05/23/2016  . Diabetic neuropathy ([place]) 01/13/2016  . Depression 11/01/2015  . Erectile dysfunction 01/23/2015  . Morbid obesity ([place]) 01/23/2015  . Periodontal disease 01/23/2015  . Stopped smoking with greater than 30 pack year history 01/23/2015  . Diabetic peripheral neuropathy associated with type 2 diabetes mellitus ([place]) 11/08/2014  . Mucosal abnormality of stomach   . History of colonic polyps   . Abdominal pain 09/06/2014  . GERD (gastroesophageal reflux disease) 09/06/2014  . Low serum testosterone level 08/01/2014  . DDD (degenerative disc disease), cervical 12/20/2013  . Headache 08/07/2013  . Medically noncompliant 10/25/2011  .  CAD S/P percutaneous coronary angioplasty 10/25/2011  . MURMUR 04/16/2009  . Hypertension associated with diabetes ([place]) 08/02/2008  . Hyperlipidemia associated with type 2 diabetes mellitus ([place]) 07/31/2008  . DISORDERS OF IRON METABOLISM 07/31/2008    Past Surgical History:  Procedure Laterality Date  . ABDOMINAL AORTOGRAM W/LOWER EXTREMITY N/A 03/21/2019   Procedure: ABDOMINAL AORTOGRAM W/LOWER EXTREMITY;  Surgeon: [name], MD;  Location: [place] CV LAB;  Service:  Cardiovascular;  Laterality: N/A;  . BACK SURGERY  2015   ACDF by Dr. [name]  . COLONOSCOPY N/A 10/01/2014   Dr. [name]: multiple tubular adenomas removed, colonic diverticulosis, redundant colon. next tcs advised for 09/2017. PATIENT NEEDS PROPOFOL FOR FAILED CONSCIOUS SEDATION  . CORONARY STENT INTERVENTION N/A 07/30/2016   Procedure: Coronary Stent Intervention;  Surgeon: [name], MD;  Location: [place] CV LAB;  Service: Cardiovascular;  Laterality: N/A;  . CORONARY STENT PLACEMENT  2000   By Dr. Olevia Perches  . EP IMPLANTABLE DEVICE N/A 05/25/2016   Procedure: Loop Recorder Insertion;  Surgeon: [name], MD;  Location: [place] CV LAB;  Service: Cardiovascular;  Laterality: N/A;  . ESOPHAGOGASTRODUODENOSCOPY     esophagus [facility] at [facility]  . ESOPHAGOGASTRODUODENOSCOPY N/A 10/01/2014   Dr. [name]: patchy mottling/erythema and minimal polypoid appearance of gastric mucosa. bx with mild inlammation but no H.pylori  . HERNIA REPAIR  [id number]   umbilical  . LEFT HEART CATH AND CORONARY ANGIOGRAPHY N/A 07/30/2016   Procedure: Left Heart Cath and Coronary Angiography;  Surgeon: [name], MD;  Location: [place] [facility];  Service: Cardiovascular;  Laterality: N/A;  . LEFT HEART CATH AND CORONARY ANGIOGRAPHY N/A 01/19/2018   Procedure: LEFT HEART CATH AND CORONARY ANGIOGRAPHY;  Surgeon: [name], MD;  Location: [place] [facility];  Service: Cardiovascular;  Laterality: N/A;  . LESION REMOVAL     Lip and hand   . LOWER EXTREMITY ANGIOGRAPHY N/A 04/18/2019   Procedure: LOWER EXTREMITY ANGIOGRAPHY;  Surgeon: [name], MD;  Location: [place] [facility] LAB;  Service: Cardiovascular;  Laterality: N/A;  . NECK SURGERY    . PERIPHERAL VASCULAR BALLOON ANGIOPLASTY  04/18/2019   Procedure: PERIPHERAL VASCULAR BALLOON ANGIOPLASTY;  Surgeon: [name], MD;  Location: [place] [facility] LAB;  Service: Cardiovascular;;  . PERIPHERAL VASCULAR INTERVENTION Right 03/21/2019    Procedure: PERIPHERAL VASCULAR INTERVENTION;  Surgeon: [name], MD;  Location: [place] CV LAB;  Service: Cardiovascular;  Laterality: Right;  superficial femoral       Family History  Problem Relation Age of Onset  . Diabetes Father   . Valvular heart disease Father   . Arthritis Father   . Heart disease Father   . Stroke Father   . Alzheimer's disease Mother   . Hyperlipidemia Mother   . Hypertension Mother   . Arthritis Mother   . Lung cancer Mother   . Stroke Mother   . Headache Mother   . Arthritis/Rheumatoid Sister   . Diabetes Sister   . Hypertension Sister   . Hyperlipidemia Sister   . Depression Sister   . Dementia Maternal Aunt   . Dementia Maternal Uncle   . Heart disease Maternal Uncle   . Stomach cancer Paternal Uncle   . Colon cancer Neg Hx   . Liver disease Neg Hx     Social History   Tobacco Use  . Smoking status: Former Smoker    Packs/day: 0.25    Years: 51.00    Pack  years: 12.75    Types: Cigarettes    Quit date: 08/14/2016    Years since quitting: 3.7  . Smokeless tobacco: Never Used  . Tobacco comment: smokes  a pack a week  Vaping Use  . Vaping Use: Never used  Substance Use Topics  . Alcohol use: No    Alcohol/week: 0.0 standard drinks  . Drug use: No    Home Medications Prior to Admission medications   Medication Sig Start Date End Date Taking? Authorizing Provider  albuterol (VENTOLIN HFA) 108 (90 Base) MCG/ACT inhaler Inhale 2 puffs into the lungs every 6 (six) hours as needed for wheezing or shortness of breath. 08/18/19  Yes Wendall Stade, MD  AMBULATORY NON FORMULARY MEDICATION Take 180 mg by mouth daily at 12 noon. Medication Name: Clear Study Drug-Bempodic acid versus placebo 03/29/17  Yes Marinus Maw, MD  apixaban (ELIQUIS) 5 MG TABS tablet Take 1 tablet (5 mg total) by mouth 2 (two) times daily. 05/13/20  Yes Wendall Stade, MD  Chlorphen-Phenyleph-ASA (ALKA-SELTZER PLUS COLD PO) Take 2 tablets by mouth daily  as needed (allergies).   Yes [provider]  clopidogrel (PLAVIX) 75 MG tablet Take 1 tablet by mouth once daily 05/20/20  Yes [name], Fran Lowes, MD  dapagliflozin propanediol (FARXIGA) 5 MG TABS tablet Take 1 tablet (5 mg total) by mouth daily before breakfast. 02/27/20  Yes Hawks, Christy A, FNP  diphenhydrAMINE-APAP, sleep, (TYLENOL PM EXTRA STRENGTH PO) Take 1 tablet by mouth at bedtime as needed (pain).   Yes [provider]  DULoxetine (CYMBALTA) 30 MG capsule Take 1 capsule (30 mg total) by mouth daily. 07/26/19  Yes Hawks, Christy A, FNP  fenofibrate 160 MG tablet TAKE 1 TABLET BY MOUTH ONCE DAILY FOR CHOLESTEROL AND TRIGLYCERIDES Patient taking differently: Take 160 mg by mouth at bedtime. 07/26/19  Yes Hawks, Christy A, FNP  furosemide (LASIX) 20 MG tablet TAKE 1 TABLET BY MOUTH ONCE DAILY AS NEEDED FOR EDEMA Patient taking differently: Take 20 mg by mouth daily. 03/06/20  Yes Hawks, Christy A, FNP  gabapentin (NEURONTIN) 300 MG capsule TAKE 2 CAPSULES BY MOUTH THREE TIMES DAILY Patient taking differently: Take 900 mg by mouth 2 (two) times daily. 03/12/20  Yes Hawks, Christy A, FNP  HUMALOG KWIKPEN 100 UNIT/ML KwikPen INJECT 5 TO 20 UNITS SUBCUTANEOUSLY THREE TIMES DAILY WITH MEALS Patient taking differently: 15 Units. INJECT 5 TO 20 UNITS SUBCUTANEOUSLY THREE TIMES DAILY WITH MEALS 11/13/19  Yes Hawks, Christy A, FNP  HYDROcodone-acetaminophen (NORCO) 10-325 MG tablet Take 1 tablet by mouth every 6 (six) hours as needed for moderate pain. 05/06/20 06/05/20 Yes [provider]  isosorbide mononitrate (IMDUR) 30 MG 24 hr tablet Take 1 tablet (30 mg total) by mouth daily. 07/26/19  Yes Hawks, Christy A, FNP  linaclotide (LINZESS) 72 MCG capsule Take 1 capsule (72 mcg total) by mouth daily as needed (constipation). 07/26/19  Yes Hawks, Christy A, FNP  Menthol, Topical Analgesic, (BLUE-EMU MAXIMUM STRENGTH EX) Apply 1 application topically daily as needed (back pain).   Yes  [provider]  metoprolol succinate (TOPROL-XL) 50 MG 24 hr tablet Take 1 tablet by mouth once daily 01/17/20  Yes Wendall Stade, MD  metoprolol tartrate (LOPRESSOR) 50 MG tablet Take 50 mg by mouth daily as needed (a-fib).   Yes [provider]  nitroGLYCERIN (NITROSTAT) 0.4 MG SL tablet Place 1 tablet (0.4 mg total) under the tongue every 5 (five) minutes x 3 doses as needed for chest  pain. 03/08/19  Yes [name], MD  pantoprazole (PROTONIX) 40 MG tablet Take 1 tablet by mouth once daily 12/20/19  Yes Hawks, Christy A, FNP  Semaglutide, 1 MG/DOSE, (OZEMPIC, 1 MG/DOSE,) 2 MG/1.5ML SOPN Inject 0.75 mLs (1 mg total) into the skin once a week. Patient taking differently: Inject 1 mg into the skin every Friday. 12/12/19  Yes Hawks, Christy A, FNP  telmisartan (MICARDIS) 20 MG tablet Take 1 tablet (20 mg total) by mouth daily. 10/30/19  Yes Hawks, Christy A, FNP  TRESIBA FLEXTOUCH 200 UNIT/ML FlexTouch Pen Inject 80 Units into the skin in the morning and at bedtime. 05/16/20  Yes Hawks, Christy A, FNP  vitamin B-12 (CYANOCOBALAMIN) 1000 MCG tablet Take 1,000 mcg by mouth daily.   Yes [provider]  triamcinolone (NASACORT) 55 MCG/ACT AERO nasal inhaler Place 1 spray into the nose as needed (allergies).    [provider]    Allergies    Shellfish allergy, Sulfa antibiotics, Ace inhibitors, Invokana [canagliflozin], Lisinopril, Metformin and related, Pravastatin sodium, Milk-related compounds, Crestor [rosuvastatin], Fenofibrate, Horse-derived products, Iodine, Lexapro [escitalopram oxalate], Lipitor [atorvastatin], Livalo [pitavastatin], Repatha [evolocumab], and Tape  Review of Systems   Review of Systems  Constitutional: Negative for fever.  Respiratory: Negative for shortness of breath.   Cardiovascular: Positive for chest pain. Negative for leg swelling.  Gastrointestinal: Negative for abdominal pain, nausea and vomiting.  Genitourinary: Negative for  dysuria.  Neurological: Negative for headaches.  All other systems reviewed and are negative.   Physical Exam Updated Vital Signs BP (!) 141/66   Pulse 90   Temp 98.3 F (36.8 C) (Oral)   Resp 17   Ht 1.88 m (6\' 2" )   Wt 131.5 kg   SpO2 99%   BMI 37.23 kg/m   Physical Exam Vitals and nursing note reviewed.  Constitutional:      Appearance: He is well-developed and well-nourished. He is obese. He is not ill-appearing or diaphoretic.  HENT:     Head: Normocephalic and atraumatic.     Nose: Nose normal.  Eyes:     Pupils: Pupils are equal, round, and reactive to light.  Cardiovascular:     Rate and Rhythm: Normal rate and regular rhythm.     Heart sounds: Normal heart sounds.  Pulmonary:     Effort: Pulmonary effort is normal. No respiratory distress.     Breath sounds: Normal breath sounds.  Abdominal:     General: Bowel sounds are normal.     Palpations: Abdomen is soft.     Tenderness: There is no abdominal tenderness.     Hernia: A hernia is present.  Musculoskeletal:        General: No edema.     Cervical back: Neck supple.     Comments: Trace bilateral lower extremity edema  Lymphadenopathy:     Cervical: No cervical adenopathy.  Skin:    General: Skin is warm and dry.  Neurological:     Mental Status: He is alert and oriented to person, place, and time.  Psychiatric:        Mood and Affect: Mood and affect and mood normal.     ED Results / Procedures / Treatments   Labs (all labs ordered are listed, but only abnormal results are displayed) Labs Reviewed  HEMOGLOBIN A1C - Abnormal; Notable for the following components:      Result Value   Hgb A1c MFr Bld 6.7 (*)    All other components within normal limits  CBC WITH DIFFERENTIAL/PLATELET - Abnormal; Notable for the following components:   Hemoglobin 12.9 (*)    All other components within normal limits  PROTIME-INR - Abnormal; Notable for the following components:   Prothrombin Time 15.7 (*)    INR  1.3 (*)    All other components within normal limits  APTT - Abnormal; Notable for the following components:   aPTT 47 (*)    All other components within normal limits  COMPREHENSIVE METABOLIC PANEL - Abnormal; Notable for the following components:   Sodium 131 (*)    Chloride 95 (*)    Glucose, Bld 139 (*)    Creatinine, Ser 1.49 (*)    GFR, Estimated 50 (*)    All other components within normal limits  LIPID PANEL - Abnormal; Notable for the following components:   Triglycerides 378 (*)    HDL 21 (*)    VLDL 76 (*)    All other components within normal limits  TROPONIN I (HIGH SENSITIVITY) - Abnormal; Notable for the following components:   Troponin I (High Sensitivity) 218 (*)    All other components within normal limits  RESP PANEL BY RT-PCR (FLU A&B, COVID) ARPGX2  HEPARIN LEVEL (UNFRACTIONATED)  APTT  HIV ANTIBODY (ROUTINE TESTING W REFLEX)  TROPONIN I (HIGH SENSITIVITY)    EKG EKG Interpretation  Date/Time:  Thursday May 23 2020 00:51:40 EST Ventricular Rate:  93 PR Interval:    QRS Duration: 108 QT Interval:  393 QTC Calculation: 489 R Axis:   87 Text Interpretation: Sinus rhythm Short PR interval Borderline right axis deviation Nonspecific repol abnormality, lateral leads Lateral depressions Confirmed by Thayer Jew [phone]) on 05/23/2020 12:53:22 AM   Radiology DG Chest Port 1 View  Result Date: 05/23/2020 CLINICAL DATA:  Chest pain since 10:30 p.m. yesterday, history of coronary artery disease EXAM: PORTABLE CHEST 1 VIEW COMPARISON:  01/18/2018 FINDINGS: Single frontal view of the chest demonstrates stable enlarged cardiac silhouette. No airspace disease, effusion, or pneumothorax. No acute bony abnormalities. IMPRESSION: 1. Stable exam, no acute process. Electronically Signed   By: Randa Ngo M.D.   On: 05/23/2020 01:41    Procedures Procedures (including critical care time)  Medications Ordered in ED Medications  0.9 %  sodium chloride infusion  ( Intravenous New Bag/Given 05/23/20 0057)  nitroGLYCERIN 50 mg in dextrose 5 % 250 mL (0.2 mg/mL) infusion (5 mcg/min Intravenous New Bag/Given 05/23/20 0239)  HYDROcodone-acetaminophen (NORCO) 10-325 MG per tablet 1 tablet (has no administration in time range)  fenofibrate tablet 160 mg (has no administration in time range)  furosemide (LASIX) tablet 20 mg (has no administration in time range)  isosorbide mononitrate (IMDUR) 24 hr tablet 30 mg (has no administration in time range)  metoprolol succinate (TOPROL-XL) 24 hr tablet 100 mg (has no administration in time range)  irbesartan (AVAPRO) tablet 75 mg (has no administration in time range)  DULoxetine (CYMBALTA) DR capsule 30 mg (has no administration in time range)  dapagliflozin propanediol (FARXIGA) tablet 5 mg (has no administration in time range)  insulin lispro (HUMALOG) KwikPen 15 Units (has no administration in time range)  insulin degludec (TRESIBA) FlexTouch Pen SOPN 80 Units (has no administration in time range)  pantoprazole (PROTONIX) EC tablet 40 mg (has no administration in time range)  clopidogrel (PLAVIX) tablet 75 mg (has no administration in time range)  vitamin B-12 (CYANOCOBALAMIN) tablet 1,000 mcg (has no administration in time range)  gabapentin (NEURONTIN) capsule 900 mg (has no administration in time range)  albuterol (  VENTOLIN HFA) 108 (90 Base) MCG/ACT inhaler 2 puff (has no administration in time range)  nitroGLYCERIN (NITROSTAT) SL tablet 0.4 mg (has no administration in time range)  acetaminophen (TYLENOL) tablet 650 mg (has no administration in time range)  ondansetron (ZOFRAN) injection 4 mg (has no administration in time range)  heparin ADULT infusion 100 units/mL (25000 units/290mL) (has no administration in time range)  aspirin chewable tablet 324 mg (324 mg Oral Given 05/23/20 0053)  aspirin 325 MG tablet (  Given 05/23/20 0053)    ED Course  I have reviewed the triage vital signs and the nursing  notes.  Pertinent labs & imaging results that were available during my care of the patient were reviewed by me and considered in my medical decision making (see chart for details).  Clinical Course as of 05/23/20 0420  Thu May 23, 2020  0100 Poke with cardiology fellow.  Code STEMI canceled per Dr. Irish Lack based on patient history and EKG.  Continue medical work-up and management. [CH]  0229 Spoke with cardiology fellow.  Wants to get repeat trop prior to admission. [CH]    Clinical Course User Index [CH] Louay Myrie, Barbette Hair, MD   MDM Rules/Calculators/A&P                          Patient presents as a code STEMI.  Been having chest pain since 10:30 PM.  Significant history of the same.  He is overall nontoxic.  EKG here does not show ST elevation consistent with a STEMI.  He does have some lateral depressions which are changed from prior.  STEMI was canceled per cardiology.  Work-up notable for initial troponin of 218.  Patient placed on IV nitroglycerin and drip.  Heparin held initially given that he is on Eliquis.  Chest x-[place] without pneumothorax or pneumonia.  Plan for admission to the cardiology service as an N STEMI for ongoing likely medical therapy.  He may need a left heart catheterization. Final Clinical Impression(s) / ED Diagnoses Final diagnoses:  NSTEMI (non-ST elevated myocardial infarction) [facility])    Rx / [place] Orders ED Discharge Orders    None       Naudia Crosley, Barbette Hair, MD 05/23/20 [phone]

## 2020-05-23 NOTE — ED Triage Notes (Signed)
Pt comes via [facility] EMS, began to have CP around 1030 pm, received 5 nitro enroute with some relief, hx of multiple stents in the past. L sided CP, on eliquis for afib

## 2020-05-23 NOTE — H&P (Signed)
Cardiology Admission History and Physical:   Patient ID: [name] [name] MRN: [id number]; DOB: [birth year]   Admission date: 05/23/2020  Primary Care Provider: [name], FNP [facility] Cardiologist: Carlyle Dolly, MD   [facility] Electrophysiologist:  Cristopher Peru, MD    Chief Complaint:  Chest pain  Patient Profile:   [name] [name] is a 70 y.o. male with history of HTN, HLD, CAD, DM, PAD, and depression who presents with worsening chest pain.  History of Present Illness:   [name] [name] has had intermittent chest pain over the past week. He saw his PCP 3 days ago but refused to go to the ER for evaluation. He states that the pain resolved after his visit to his PCP. He did have an episode of A. Fib on 1/4 that lasted a few hours with heart rates in the 120s. He had some mild chest discomfort with this that then resolved. This evening, he developed worsening chest pain that radiated down his left arm and somewhat into his right arm prompting him to call 911. He denies any associated symptoms including shortness of breath, palpitations, lightheadedness, dizziness, nausea, or diaphoresis. He took 5 SL nitro en route. He denies any current pain.    Past Medical History:  Diagnosis Date  . Anxiety   . Arthritis   . CAD (coronary artery disease)    a. 2010: DES to CTO of RCA. EF 55% b. 07/2016: cath showing total occlusion within previously placed RCA stent (collaterals present), severe stenosis along LCx and OM1 (treated with 2 overlapping DES). c. repeat cath in 01/2018 showing patent stents along LCx and OM with CTO of D2, CTO of distal LCx, and CTO of RCA with collaterals present overall unchanged since 2018 with medical management recom  . Cellulitis and abscess rt groin   . Depression   . Diabetes mellitus   . Disorders of iron metabolism   . GERD (gastroesophageal reflux disease)   . Hyperlipidemia   . Hypertension   . Low serum testosterone level   . Medically  noncompliant   . Myocardial infarction [facility])     Past Surgical History:  Procedure Laterality Date  . ABDOMINAL AORTOGRAM W/LOWER EXTREMITY N/A 03/21/2019   Procedure: ABDOMINAL AORTOGRAM W/LOWER EXTREMITY;  Surgeon: [name], MD;  Location: [place] [facility];  Service: Cardiovascular;  Laterality: N/A;  . BACK SURGERY  2015   ACDF by Dr. [name]  . COLONOSCOPY N/A 10/01/2014   Dr. [name]: multiple tubular adenomas removed, colonic diverticulosis, redundant colon. next tcs advised for 09/2017. PATIENT NEEDS PROPOFOL FOR FAILED CONSCIOUS SEDATION  . CORONARY STENT INTERVENTION N/A 07/30/2016   Procedure: Coronary Stent Intervention;  Surgeon: [name], MD;  Location: [place] [facility];  Service: Cardiovascular;  Laterality: N/A;  . CORONARY STENT PLACEMENT  2000   By Dr. Olevia Perches  . EP IMPLANTABLE DEVICE N/A 05/25/2016   Procedure: Loop Recorder Insertion;  Surgeon: [name], MD;  Location: [place] [facility];  Service: Cardiovascular;  Laterality: N/A;  . ESOPHAGOGASTRODUODENOSCOPY     esophagus stretched remotely at [facility]  . ESOPHAGOGASTRODUODENOSCOPY N/A 10/01/2014   Dr. [name]: patchy mottling/erythema and minimal polypoid appearance of gastric mucosa. bx with mild inlammation but no H.pylori  . HERNIA REPAIR  [id number]   umbilical  . LEFT HEART CATH AND CORONARY ANGIOGRAPHY N/A 07/30/2016   Procedure: Left Heart Cath and Coronary Angiography;  Surgeon: [name], MD;  Location: [place] [facility];  Service: Cardiovascular;  Laterality:  N/A;  . LEFT HEART CATH AND CORONARY ANGIOGRAPHY N/A 01/19/2018   Procedure: LEFT HEART CATH AND CORONARY ANGIOGRAPHY;  Surgeon: Lennette Bihari, MD;  [facility];  Service: Cardiovascular;  Laterality: N/A;  . LESION REMOVAL     Lip and hand   . LOWER EXTREMITY ANGIOGRAPHY N/A 04/18/2019   Procedure: LOWER EXTREMITY ANGIOGRAPHY;  Surgeon: Nada Libman, MD;  Location: [facility];  Service: Cardiovascular;   Laterality: N/A;  . NECK SURGERY    . PERIPHERAL VASCULAR BALLOON ANGIOPLASTY  04/18/2019   Procedure: PERIPHERAL VASCULAR BALLOON ANGIOPLASTY;  Surgeon: Nada Libman, MD;  Location: [facility];  Service: Cardiovascular;;  . PERIPHERAL VASCULAR INTERVENTION Right 03/21/2019   Procedure: PERIPHERAL VASCULAR INTERVENTION;  Surgeon: Nada Libman, MD;  [facility];  Service: Cardiovascular;  Laterality: Right;  superficial femoral     Medications Prior to Admission: Prior to Admission medications   Medication Sig Start Date End Date Taking? Authorizing Provider  albuterol (VENTOLIN HFA) 108 (90 Base) MCG/ACT inhaler Inhale 2 puffs into the lungs every 6 (six) hours as needed for wheezing or shortness of breath. 08/18/19  Yes Wendall Stade, MD  AMBULATORY NON FORMULARY MEDICATION Take 180 mg by mouth daily at 12 noon. Medication Name: Clear Study Drug-Bempodic acid versus placebo 03/29/17  Yes Marinus Maw, MD  apixaban (ELIQUIS) 5 MG TABS tablet Take 1 tablet (5 mg total) by mouth 2 (two) times daily. 05/13/20  Yes Wendall Stade, MD  Chlorphen-Phenyleph-ASA (ALKA-SELTZER PLUS COLD PO) Take 2 tablets by mouth daily as needed (allergies).   Yes [provider]  clopidogrel (PLAVIX) 75 MG tablet Take 1 tablet by mouth once daily 05/20/20  Yes [name], Fran Lowes, MD  dapagliflozin propanediol (FARXIGA) 5 MG TABS tablet Take 1 tablet (5 mg total) by mouth daily before breakfast. 02/27/20  Yes Hawks, Christy A, FNP  diphenhydrAMINE-APAP, sleep, (TYLENOL PM EXTRA STRENGTH PO) Take 1 tablet by mouth at bedtime as needed (pain).   Yes [provider]  DULoxetine (CYMBALTA) 30 MG capsule Take 1 capsule (30 mg total) by mouth daily. 07/26/19  Yes Hawks, Christy A, FNP  fenofibrate 160 MG tablet TAKE 1 TABLET BY MOUTH ONCE DAILY FOR CHOLESTEROL AND TRIGLYCERIDES Patient taking differently: Take 160 mg by mouth at bedtime. 07/26/19  Yes Hawks, Christy A, FNP   furosemide (LASIX) 20 MG tablet TAKE 1 TABLET BY MOUTH ONCE DAILY AS NEEDED FOR EDEMA Patient taking differently: Take 20 mg by mouth daily. 03/06/20  Yes Hawks, Christy A, FNP  gabapentin (NEURONTIN) 300 MG capsule TAKE 2 CAPSULES BY MOUTH THREE TIMES DAILY Patient taking differently: Take 900 mg by mouth 2 (two) times daily. 03/12/20  Yes Hawks, Christy A, FNP  HUMALOG KWIKPEN 100 UNIT/ML KwikPen INJECT 5 TO 20 UNITS SUBCUTANEOUSLY THREE TIMES DAILY WITH MEALS Patient taking differently: 15 Units. INJECT 5 TO 20 UNITS SUBCUTANEOUSLY THREE TIMES DAILY WITH MEALS 11/13/19  Yes Hawks, Christy A, FNP  HYDROcodone-acetaminophen (NORCO) 10-325 MG tablet Take 1 tablet by mouth every 6 (six) hours as needed for moderate pain. 05/06/20 06/05/20 Yes [provider]  isosorbide mononitrate (IMDUR) 30 MG 24 hr tablet Take 1 tablet (30 mg total) by mouth daily. 07/26/19  Yes Hawks, Christy A, FNP  linaclotide (LINZESS) 72 MCG capsule Take 1 capsule (72 mcg total) by mouth daily as needed (constipation). 07/26/19  Yes Hawks, Christy A, FNP  Menthol, Topical Analgesic, (BLUE-EMU MAXIMUM STRENGTH  EX) Apply 1 application topically daily as needed (back pain).   Yes [provider]  metoprolol succinate (TOPROL-XL) 50 MG 24 hr tablet Take 1 tablet by mouth once daily 01/17/20  Yes Wendall Stade, MD  metoprolol tartrate (LOPRESSOR) 50 MG tablet Take 50 mg by mouth daily as needed (a-fib).   Yes [provider]  nitroGLYCERIN (NITROSTAT) 0.4 MG SL tablet Place 1 tablet (0.4 mg total) under the tongue every 5 (five) minutes x 3 doses as needed for chest pain. 03/08/19  Yes Marinus Maw, MD  pantoprazole (PROTONIX) 40 MG tablet Take 1 tablet by mouth once daily 12/20/19  Yes Hawks, Christy A, FNP  Semaglutide, 1 MG/DOSE, (OZEMPIC, 1 MG/DOSE,) 2 MG/1.5ML SOPN Inject 0.75 mLs (1 mg total) into the skin once a week. Patient taking differently: Inject 1 mg into the skin every Friday. 12/12/19  Yes  Hawks, Christy A, FNP  telmisartan (MICARDIS) 20 MG tablet Take 1 tablet (20 mg total) by mouth daily. 10/30/19  Yes Hawks, Christy A, FNP  TRESIBA FLEXTOUCH 200 UNIT/ML FlexTouch Pen Inject 80 Units into the skin in the morning and at bedtime. 05/16/20  Yes Hawks, Christy A, FNP  vitamin B-12 (CYANOCOBALAMIN) 1000 MCG tablet Take 1,000 mcg by mouth daily.   Yes [provider]  triamcinolone (NASACORT) 55 MCG/ACT AERO nasal inhaler Place 1 spray into the nose as needed (allergies).    [provider]     Allergies:    Allergies  Allergen Reactions  . Shellfish Allergy Anaphylaxis and Other (See Comments)    Tongue swelling, hives   . Sulfa Antibiotics Anaphylaxis and Rash    Tongue swelling, hives  . Ace Inhibitors Other (See Comments) and Cough    CKD, renal failure   . Invokana [Canagliflozin] Other (See Comments)    Syncope / dehydration  . Lisinopril Cough  . Metformin And Related Itching  . Pravastatin Sodium Other (See Comments)    myalgias  . Milk-Related Compounds     Ties stomach in knots   . Crestor [Rosuvastatin] Other (See Comments)    Myalgias   . Fenofibrate Other (See Comments)    Body aches - pt currently taking isnt sure if its causing any pain  . Horse-Derived Products Rash  . Iodine Other (See Comments)    ????. PATIENT SAYS HE DID FINE WITH LAST CT WITH CONTRAST** patient had IV contrast on 06/21/2018 without pre meds without any issues. Patient had allergic reaction to Shellfish 30 years ago-not to IV contrast.   . Lexapro [Escitalopram Oxalate] Other (See Comments)    Buzzing in ears,headache, felt like a zombie  . Lipitor [Atorvastatin] Other (See Comments)    myalgias  . Livalo [Pitavastatin] Other (See Comments)    Myalgias   . Repatha [Evolocumab] Other (See Comments)    Myalgias, flu like sx  . Tape Rash    Social History:   Social History   Socioeconomic History  . Marital status: Married    Spouse name: Not on file  .  Number of children: 2  . Years of education: 64  . Highest education level: 12th grade  Occupational History  . Occupation: retired  Tobacco Use  . Smoking status: Former Smoker    Packs/day: 0.25    Years: 51.00    Pack years: 12.75    Types: Cigarettes    Quit date: 08/14/2016    Years since quitting: 3.7  . Smokeless tobacco: Never Used  . Tobacco comment:  smokes  a pack a week  Vaping Use  . Vaping Use: Never used  Substance and Sexual Activity  . Alcohol use: No    Alcohol/week: 0.0 standard drinks  . Drug use: No  . Sexual activity: Yes  Other Topics Concern  . Not on file  Social History Narrative   Lives with his wife.  Retired.  Unable to afford expensive medicines.        Caffeine: 1 cup of 1/2 caff coffee in AM, drinks more if at a restaurant    Social Determinants of Health   Financial Resource Strain: Not on file  Food Insecurity: Not on file  Transportation Needs: Not on file  Physical Activity: Not on file  Stress: Not on file  Social Connections: Not on file  Intimate Partner Violence: Not on file    Family History:   The patient's family history includes Alzheimer's disease in his mother; Arthritis in his father and mother; Arthritis/Rheumatoid in his sister; Dementia in his maternal aunt and maternal uncle; Depression in his sister; Diabetes in his father and sister; Headache in his mother; Heart disease in his father and maternal uncle; Hyperlipidemia in his mother and sister; Hypertension in his mother and sister; Lung cancer in his mother; Stomach cancer in his paternal uncle; Stroke in his father and mother; Valvular heart disease in his father. There is no history of Colon cancer or Liver disease.    ROS:  Please see the history of present illness.  All other ROS reviewed and negative.     Physical Exam/Data:   Vitals:   05/23/20 0230 05/23/20 0300 05/23/20 0315 05/23/20 0345  BP: (!) 151/86 (!) 155/78 (!) 148/69 (!) 141/66  Pulse: 90 84 88 90   Resp: 18 (!) 22 18 17   Temp:      TempSrc:      SpO2: 98% 96% 98% 99%  Weight:      Height:       No intake or output data in the 24 hours ending 05/23/20 0413 Last 3 Weights 05/23/2020 05/20/2020 03/21/2020  Weight (lbs) 290 lb 298 lb 9.6 oz 300 lb  Weight (kg) 131.543 kg 135.444 kg 136.079 kg  Some encounter information is confidential and restricted. Go to Review Flowsheets activity to see all data.     Body mass index is 37.23 kg/m.  General:  Obese, chronically ill appearing HEENT: normal Neck: no JVD Vascular: No carotid bruits; FA pulses 2+ bilaterally without bruits  Cardiac:  normal S1, S2; RRR; no murmur  Lungs:  clear to auscultation bilaterally, no wheezing, rhonchi or rales  Abd: soft, nontender, no hepatomegaly  Ext: Trace pitting edema bilaterally. Scattered, non-healing ulcers  Musculoskeletal:  No deformities, BUE and BLE strength normal and equal Skin: warm and dry  Neuro:  CNs 2-12 intact, no focal abnormalities noted Psych:  Normal affect    EKG:  The ECG that was done was personally reviewed and demonstrates sinus rhythm with lateral ST depressions and T wave inversions in V3-V6  Relevant CV Studies: Echo 11/01/19  1. Inferior basal akinesis . Left ventricular ejection fraction, by  estimation, is 50 to 55%. The left ventricle has low normal function. The  left ventricle demonstrates regional wall motion abnormalities (see  scoring diagram/findings for description).  There is mild left ventricular hypertrophy. Left ventricular diastolic  parameters were normal.  2. Right ventricular systolic function is normal. The right ventricular  size is normal.  3. The mitral valve is normal in  structure. Trivial mitral valve  regurgitation. No evidence of mitral stenosis.  4. Calcification of the non coronary cusp . The aortic valve is  tricuspid. Aortic valve regurgitation is not visualized. Mild to moderate  aortic valve sclerosis/calcification is present,  without any evidence of  aortic stenosis.  5. The inferior vena cava is dilated in size with >50% respiratory  variability, suggesting right atrial pressure of 8 mmHg.   LHC 2019   Prox LAD lesion is 40% stenosed.  Ost 1st Diag lesion is 40% stenosed.  Ost 2nd Diag to 2nd Diag lesion is 100% stenosed.  Mid Cx to Dist Cx lesion is 100% stenosed.  Prox RCA to Mid RCA lesion is 90% stenosed.  Prox RCA lesion is 99% stenosed.  Previously placed Ost 1st Mrg to 1st Mrg stent (unknown type) is widely patent.  Previously placed Prox Cx stent (unknown type) is widely patent.    Laboratory Data:  High Sensitivity Troponin:   Recent Labs  Lab 05/23/20 0051  TROPONINIHS 218*      Chemistry Recent Labs  Lab 05/20/20 1038 05/23/20 0051  NA 138 131*  K 4.5 3.8  CL 97 95*  CO2 26 24  GLUCOSE 137* 139*  BUN 24 23  CREATININE 1.42* 1.49*  CALCIUM 10.1 9.2  GFRNONAA 50* 50*  GFRAA 58*  --   ANIONGAP  --  12    Recent Labs  Lab 05/20/20 1038 05/23/20 0051  PROT 7.1 7.2  ALBUMIN 4.3 3.9  AST 22 26  ALT 18 24  ALKPHOS 51 40  BILITOT 0.5 0.8   Hematology Recent Labs  Lab 05/20/20 1038 05/23/20 0051  WBC 7.9 10.4  RBC 4.81 4.59  HGB 14.1 12.9*  HCT 41.9 41.7  MCV 87 90.8  MCH 29.3 28.1  MCHC 33.7 30.9  RDW 13.3 13.6  PLT 365 346   BNPNo results for input(s): BNP, PROBNP in the last 168 hours.  DDimer No results for input(s): DDIMER in the last 168 hours.   Radiology/Studies:  DG Chest Port 1 View  Result Date: 05/23/2020 CLINICAL DATA:  Chest pain since 10:30 p.m. yesterday, history of coronary artery disease EXAM: PORTABLE CHEST 1 VIEW COMPARISON:  01/18/2018 FINDINGS: Single frontal view of the chest demonstrates stable enlarged cardiac silhouette. No airspace disease, effusion, or pneumothorax. No acute bony abnormalities. IMPRESSION: 1. Stable exam, no acute process. Electronically Signed   By: Sharlet SalinaMichael  Brown M.D.   On: 05/23/2020 01:41      Assessment and Plan:   1. NSTEMI- He has known, severe CAD with multiple interventions in the past. He has had worsening chest pain over the past week. Still waiting on second troponin. Depending on results, may benefit from a repeat LHC to evaluate for progression of disease. In the interim, will increase his medical therapy by increasing his metoprolol to get better HF and A. Fib control - Increase metoprolol to 100mg  XL  - Hold eliquis, heparin drip - Continue home plavix - EKG PRN for chest pain - Continue nitro drip - Continue home fenofibrate - NPO in case of possible cath in AM - Check lipid panel  2. A. Fib- Notes more frequent episodes - Hold eliquis and start heparin as above - Increase metoprolol as above - Monitor on tele  3. Diabetes - Continue home humalog 15u TID with meals - Continue home tresiba 80u BID - Continue home dapa 5  4. Hypertension - Increase metoprolol as above - Continue home telmisartan  TIMI Risk Score for Unstable Angina or Non-ST Elevation MI:   The patient's TIMI risk score is 6, which indicates a 41% risk of all cause mortality, new or recurrent myocardial infarction or need for urgent revascularization in the next 14 days.   CHA2DS2-VASc Score = 6  This indicates a 9.7% annual risk of stroke. The patient's score is based upon: CHF History: No HTN History: Yes Diabetes History: Yes Stroke History: Yes Vascular Disease History: Yes Age Score: 1 Gender Score: 0   Severity of Illness: The appropriate patient status for this patient is OBSERVATION. Observation status is judged to be reasonable and necessary in order to provide the required intensity of service to ensure the patient's safety. The patient's presenting symptoms, physical exam findings, and initial radiographic and laboratory data in the context of their medical condition is felt to place them at decreased risk for further clinical deterioration. Furthermore, it is  anticipated that the patient will be medically stable for discharge from the hospital within 2 midnights of admission. The following factors support the patient status of observation.   " The patient's presenting symptoms include chest pain. " The physical exam findings include trace lower leg swelling. " The initial radiographic and laboratory data are elevated troponin.     For questions or updates, please contact [place] Please consult [email or web address] for contact info under     Signed, Princella Pellegrini, MD  05/23/2020 4:13 AM

## 2020-05-23 NOTE — ED Notes (Signed)
Pt c/o chest tightness 6/10 new onset. EKG obtained. Will notify MD

## 2020-05-23 NOTE — ED Notes (Signed)
Pt sitting at bedside eating his applesauce. Wife remains at bedside as well. All medications given.

## 2020-05-23 NOTE — ED Notes (Signed)
Hydrocodone given PO for headache.

## 2020-05-24 ENCOUNTER — Encounter (HOSPITAL_COMMUNITY): Payer: Self-pay | Admitting: Cardiovascular Disease

## 2020-05-24 ENCOUNTER — Encounter (HOSPITAL_COMMUNITY)
Admission: EM | Disposition: A | Discharge status: Home / Self Care | Payer: Self-pay | Source: Home / Self Care | Attending: Internal Medicine

## 2020-05-24 DIAGNOSIS — Z79899 Other long term (current) drug therapy: Secondary | ICD-10-CM | POA: Diagnosis not present

## 2020-05-24 DIAGNOSIS — Z7901 Long term (current) use of anticoagulants: Secondary | ICD-10-CM | POA: Diagnosis not present

## 2020-05-24 DIAGNOSIS — K219 Gastro-esophageal reflux disease without esophagitis: Secondary | ICD-10-CM | POA: Diagnosis not present

## 2020-05-24 DIAGNOSIS — I214 Non-ST elevation (NSTEMI) myocardial infarction: Secondary | ICD-10-CM | POA: Diagnosis present

## 2020-05-24 DIAGNOSIS — E1140 Type 2 diabetes mellitus with diabetic neuropathy, unspecified: Secondary | ICD-10-CM | POA: Diagnosis present

## 2020-05-24 DIAGNOSIS — Z6837 Body mass index (BMI) 37.0-37.9, adult: Secondary | ICD-10-CM | POA: Diagnosis not present

## 2020-05-24 DIAGNOSIS — I2510 Atherosclerotic heart disease of native coronary artery without angina pectoris: Secondary | ICD-10-CM | POA: Diagnosis not present

## 2020-05-24 DIAGNOSIS — E1142 Type 2 diabetes mellitus with diabetic polyneuropathy: Secondary | ICD-10-CM

## 2020-05-24 DIAGNOSIS — E785 Hyperlipidemia, unspecified: Secondary | ICD-10-CM

## 2020-05-24 DIAGNOSIS — Z87891 Personal history of nicotine dependence: Secondary | ICD-10-CM | POA: Diagnosis not present

## 2020-05-24 DIAGNOSIS — Y832 Surgical operation with anastomosis, bypass or graft as the cause of abnormal reaction of the patient, or of later complication, without mention of misadventure at the time of the procedure: Secondary | ICD-10-CM | POA: Diagnosis present

## 2020-05-24 DIAGNOSIS — Z794 Long term (current) use of insulin: Secondary | ICD-10-CM | POA: Diagnosis not present

## 2020-05-24 DIAGNOSIS — E1151 Type 2 diabetes mellitus with diabetic peripheral angiopathy without gangrene: Secondary | ICD-10-CM | POA: Diagnosis present

## 2020-05-24 DIAGNOSIS — N1831 Chronic kidney disease, stage 3a: Secondary | ICD-10-CM | POA: Diagnosis not present

## 2020-05-24 DIAGNOSIS — Z20822 Contact with and (suspected) exposure to covid-19: Secondary | ICD-10-CM | POA: Diagnosis not present

## 2020-05-24 DIAGNOSIS — Z8673 Personal history of transient ischemic attack (TIA), and cerebral infarction without residual deficits: Secondary | ICD-10-CM | POA: Diagnosis not present

## 2020-05-24 DIAGNOSIS — I129 Hypertensive chronic kidney disease with stage 1 through stage 4 chronic kidney disease, or unspecified chronic kidney disease: Secondary | ICD-10-CM | POA: Diagnosis not present

## 2020-05-24 DIAGNOSIS — I480 Paroxysmal atrial fibrillation: Secondary | ICD-10-CM

## 2020-05-24 DIAGNOSIS — E1169 Type 2 diabetes mellitus with other specified complication: Secondary | ICD-10-CM

## 2020-05-24 DIAGNOSIS — I252 Old myocardial infarction: Secondary | ICD-10-CM | POA: Diagnosis not present

## 2020-05-24 DIAGNOSIS — I152 Hypertension secondary to endocrine disorders: Secondary | ICD-10-CM | POA: Diagnosis not present

## 2020-05-24 DIAGNOSIS — Z7902 Long term (current) use of antithrombotics/antiplatelets: Secondary | ICD-10-CM | POA: Diagnosis not present

## 2020-05-24 DIAGNOSIS — E1122 Type 2 diabetes mellitus with diabetic chronic kidney disease: Secondary | ICD-10-CM | POA: Diagnosis not present

## 2020-05-24 DIAGNOSIS — Z955 Presence of coronary angioplasty implant and graft: Secondary | ICD-10-CM | POA: Diagnosis not present

## 2020-05-24 DIAGNOSIS — Z7984 Long term (current) use of oral hypoglycemic drugs: Secondary | ICD-10-CM | POA: Diagnosis not present

## 2020-05-24 DIAGNOSIS — T82855A Stenosis of coronary artery stent, initial encounter: Secondary | ICD-10-CM | POA: Diagnosis not present

## 2020-05-24 HISTORY — PX: LEFT HEART CATH AND CORONARY ANGIOGRAPHY: CATH118249

## 2020-05-24 LAB — CBC
HCT: 37.4 % — ABNORMAL LOW (ref 39.0–52.0)
Hemoglobin: 11.7 g/dL — ABNORMAL LOW (ref 13.0–17.0)
MCH: 28.4 pg (ref 26.0–34.0)
MCHC: 31.3 g/dL (ref 30.0–36.0)
MCV: 90.8 fL (ref 80.0–100.0)
Platelets: 272 10*3/uL (ref 150–400)
RBC: 4.12 MIL/uL — ABNORMAL LOW (ref 4.22–5.81)
RDW: 13.8 % (ref 11.5–15.5)
WBC: 8.4 10*3/uL (ref 4.0–10.5)
nRBC: 0 % (ref 0.0–0.2)

## 2020-05-24 LAB — BASIC METABOLIC PANEL
Anion gap: 11 (ref 5–15)
BUN: 20 mg/dL (ref 8–23)
CO2: 24 mmol/L (ref 22–32)
Calcium: 9.5 mg/dL (ref 8.9–10.3)
Chloride: 101 mmol/L (ref 98–111)
Creatinine, Ser: 1.48 mg/dL — ABNORMAL HIGH (ref 0.61–1.24)
GFR, Estimated: 51 mL/min — ABNORMAL LOW (ref >60)
Glucose, Bld: 116 mg/dL — ABNORMAL HIGH (ref 70–99)
Potassium: 4 mmol/L (ref 3.5–5.1)
Sodium: 136 mmol/L (ref 135–145)

## 2020-05-24 LAB — POCT ACTIVATED CLOTTING TIME: Activated Clotting Time: 339 seconds

## 2020-05-24 LAB — GLUCOSE, CAPILLARY
Glucose-Capillary: 102 mg/dL — ABNORMAL HIGH (ref 70–99)
Glucose-Capillary: 134 mg/dL — ABNORMAL HIGH (ref 70–99)
Glucose-Capillary: 71 mg/dL (ref 70–99)
Glucose-Capillary: 75 mg/dL (ref 70–99)
Glucose-Capillary: 90 mg/dL (ref 70–99)

## 2020-05-24 LAB — HEPARIN LEVEL (UNFRACTIONATED): Heparin Unfractionated: 1.28 IU/mL — ABNORMAL HIGH (ref 0.30–0.70)

## 2020-05-24 LAB — APTT: aPTT: 80 seconds — ABNORMAL HIGH (ref 24–36)

## 2020-05-24 LAB — TROPONIN I (HIGH SENSITIVITY): Troponin I (High Sensitivity): 260 ng/L (ref <18)

## 2020-05-24 SURGERY — LEFT HEART CATH AND CORONARY ANGIOGRAPHY
Anesthesia: LOCAL

## 2020-05-24 MED ORDER — VERAPAMIL HCL 2.5 MG/ML IV SOLN
INTRAVENOUS | Status: DC | PRN
  Administered 2020-05-24: 10 mL via INTRA_ARTERIAL

## 2020-05-24 MED ORDER — FENTANYL CITRATE (PF) 100 MCG/2ML IJ SOLN
INTRAMUSCULAR | Status: AC
  Filled 2020-05-24: qty 2

## 2020-05-24 MED ORDER — ASPIRIN 81 MG PO CHEW
81.0000 mg | CHEWABLE_TABLET | ORAL | Status: AC
  Administered 2020-05-24: 81 mg via ORAL
  Filled 2020-05-24: qty 1

## 2020-05-24 MED ORDER — ASPIRIN EC 81 MG PO TBEC
81.0000 mg | DELAYED_RELEASE_TABLET | Freq: Every day | ORAL | Status: DC
  Administered 2020-05-25: 81 mg via ORAL
  Filled 2020-05-24 (×2): qty 1

## 2020-05-24 MED ORDER — MIDAZOLAM HCL 2 MG/2ML IJ SOLN
INTRAMUSCULAR | Status: DC | PRN
  Administered 2020-05-24: 2 mg via INTRAVENOUS
  Administered 2020-05-24 (×2): 1 mg via INTRAVENOUS

## 2020-05-24 MED ORDER — SODIUM CHLORIDE 0.9% FLUSH: 3.0000 mL | Freq: Two times a day (BID) | INTRAVENOUS | Status: DC

## 2020-05-24 MED ORDER — MIDAZOLAM HCL 2 MG/2ML IJ SOLN
INTRAMUSCULAR | Status: AC
  Filled 2020-05-24: qty 2

## 2020-05-24 MED ORDER — HEPARIN (PORCINE) IN NACL 1000-0.9 UT/500ML-% IV SOLN
INTRAVENOUS | Status: AC
  Filled 2020-05-24: qty 1000

## 2020-05-24 MED ORDER — LIDOCAINE HCL (PF) 1 % IJ SOLN
INTRAMUSCULAR | Status: DC | PRN
  Administered 2020-05-24: 4 mL

## 2020-05-24 MED ORDER — HEPARIN (PORCINE) IN NACL 1000-0.9 UT/500ML-% IV SOLN
INTRAVENOUS | Status: DC | PRN
  Administered 2020-05-24 (×2): 500 mL

## 2020-05-24 MED ORDER — HEPARIN SODIUM (PORCINE) 1000 UNIT/ML IJ SOLN
INTRAMUSCULAR | Status: AC
  Filled 2020-05-24: qty 1

## 2020-05-24 MED ORDER — SODIUM CHLORIDE 0.9 % IV SOLN: INTRAVENOUS | Status: AC

## 2020-05-24 MED ORDER — FENTANYL CITRATE (PF) 100 MCG/2ML IJ SOLN
INTRAMUSCULAR | Status: DC | PRN
  Administered 2020-05-24: 25 ug via INTRAVENOUS
  Administered 2020-05-24: 50 ug via INTRAVENOUS
  Administered 2020-05-24: 25 ug via INTRAVENOUS

## 2020-05-24 MED ORDER — IOHEXOL 350 MG/ML SOLN
INTRAVENOUS | Status: DC | PRN
  Administered 2020-05-24: 140 mL

## 2020-05-24 MED ORDER — LABETALOL HCL 5 MG/ML IV SOLN: 10.0000 mg | INTRAVENOUS | Status: AC | PRN

## 2020-05-24 MED ORDER — VERAPAMIL HCL 2.5 MG/ML IV SOLN
INTRAVENOUS | Status: AC
  Filled 2020-05-24: qty 2

## 2020-05-24 MED ORDER — HYDRALAZINE HCL 20 MG/ML IJ SOLN: 10.0000 mg | INTRAMUSCULAR | Status: AC | PRN

## 2020-05-24 MED ORDER — LIDOCAINE HCL (PF) 1 % IJ SOLN
INTRAMUSCULAR | Status: AC
  Filled 2020-05-24: qty 30

## 2020-05-24 MED ORDER — SODIUM CHLORIDE 0.9 % WEIGHT BASED INFUSION
1.0000 mL/kg/h | INTRAVENOUS | Status: DC
  Administered 2020-05-24: 1 mL/kg/h via INTRAVENOUS

## 2020-05-24 MED ORDER — SODIUM CHLORIDE 0.9 % WEIGHT BASED INFUSION
3.0000 mL/kg/h | INTRAVENOUS | Status: DC
  Administered 2020-05-24: 3 mL/kg/h via INTRAVENOUS

## 2020-05-24 MED ORDER — SODIUM CHLORIDE 0.9% FLUSH: 3.0000 mL | INTRAVENOUS | Status: DC | PRN

## 2020-05-24 MED ORDER — HEPARIN SODIUM (PORCINE) 1000 UNIT/ML IJ SOLN
INTRAMUSCULAR | Status: DC | PRN
  Administered 2020-05-24 (×2): 6000 [IU] via INTRAVENOUS

## 2020-05-24 MED ORDER — SODIUM CHLORIDE 0.9 % IV SOLN: 250.0000 mL | INTRAVENOUS | Status: DC | PRN

## 2020-05-24 SURGICAL SUPPLY — 22 items
BAG SNAP BAND KOVER 36X36 (MISCELLANEOUS) ×1 IMPLANT
BALLN SAPPHIRE 2.5X12 (BALLOONS) ×2
BALLN ~~LOC~~ EMERGE MR 3.0X20 (BALLOONS) ×2
BALLN ~~LOC~~ EMERGE MR 3.25X20 (BALLOONS) ×2
BALLOON SAPPHIRE 2.5X12 (BALLOONS) IMPLANT
BALLOON ~~LOC~~ EMERGE MR 3.0X20 (BALLOONS) IMPLANT
BALLOON ~~LOC~~ EMERGE MR 3.25X20 (BALLOONS) IMPLANT
CATH 5FR JL3.5 JR4 ANG PIG MP (CATHETERS) ×1 IMPLANT
CATH LAUNCHER 6FR EBU 3.75 (CATHETERS) ×1 IMPLANT
COVER DOME SNAP 22 D (MISCELLANEOUS) ×2 IMPLANT
DEVICE RAD COMP TR BAND LRG (VASCULAR PRODUCTS) ×1 IMPLANT
GLIDESHEATH SLEND SS 6F .021 (SHEATH) ×1 IMPLANT
GUIDEWIRE INQWIRE 1.5J.035X260 (WIRE) IMPLANT
INQWIRE 1.5J .035X260CM (WIRE) ×2
KIT ENCORE 26 ADVANTAGE (KITS) ×1 IMPLANT
KIT HEART LEFT (KITS) ×2 IMPLANT
PACK CARDIAC CATHETERIZATION (CUSTOM PROCEDURE TRAY) ×2 IMPLANT
STENT RESOLUTE ONYX 2.75X22 (Permanent Stent) ×1 IMPLANT
STENT RESOLUTE ONYX 3.0X22 (Permanent Stent) ×1 IMPLANT
TRANSDUCER W/STOPCOCK (MISCELLANEOUS) ×2 IMPLANT
TUBING CIL FLEX 10 FLL-RA (TUBING) ×2 IMPLANT
WIRE COUGAR XT STRL 190CM (WIRE) ×1 IMPLANT

## 2020-05-24 NOTE — Progress Notes (Addendum)
ANTICOAGULATION CONSULT NOTE - Follow Up Consult  Pharmacy Consult for Heparin Indication: chest pain/ACS with h/o afib  Allergies  Allergen Reactions  . Shellfish Allergy Anaphylaxis and Other (See Comments)    Tongue swelling, hives   . Sulfa Antibiotics Anaphylaxis and Rash    Tongue swelling, hives  . Ace Inhibitors Other (See Comments) and Cough    CKD, renal failure   . Invokana [Canagliflozin] Other (See Comments)    Syncope / dehydration  . Lisinopril Cough  . Metformin And Related Itching  . Pravastatin Sodium Other (See Comments)    myalgias  . Milk-Related Compounds     Ties stomach in knots   . Crestor [Rosuvastatin] Other (See Comments)    Myalgias   . Fenofibrate Other (See Comments)    Body aches - pt currently taking isnt sure if its causing any pain  . Horse-Derived Products Rash  . Iodine Other (See Comments)    ????. PATIENT SAYS HE DID FINE WITH LAST CT WITH CONTRAST** patient had IV contrast on 06/21/2018 without pre meds without any issues. Patient had allergic reaction to Shellfish 30 years ago-not to IV contrast.   . Lexapro [Escitalopram Oxalate] Other (See Comments)    Buzzing in ears,headache, felt like a zombie  . Lipitor [Atorvastatin] Other (See Comments)    myalgias  . Livalo [Pitavastatin] Other (See Comments)    Myalgias   . Repatha [Evolocumab] Other (See Comments)    Myalgias, flu like sx  . Tape Rash    Patient Measurements: Height: 6\' 2"  (188 cm) Weight: 132.9 kg (293 lb 1.6 oz) IBW/kg (Calculated) : 82.2 Heparin Dosing Weight:   Vital Signs: Temp: 98.4 F (36.9 C) (01/07 0353) Temp Source: Oral (01/07 0353) BP: 181/87 (01/07 1044) Pulse Rate: 85 (01/07 1044)  Labs: Recent Labs    05/23/20 0051 05/23/20 0303 05/23/20 1436 05/24/20 0354 05/24/20 0441  HGB 12.9*  --   --  11.7*  --   HCT 41.7  --   --  37.4*  --   PLT 346  --   --  272  --   APTT 47*  --  68* 80*  --   LABPROT 15.7*  --   --   --   --   INR 1.3*  --    --   --   --   HEPARINUNFRC  --   --  >2.20* 1.28*  --   CREATININE 1.49*  --   --  1.48*  --   TROPONINIHS 218* 265*  --   --  260*    Estimated Creatinine Clearance: 68.3 mL/min (A) (by C-G formula based on SCr of 1.48 mg/dL (H)).  Assessment: Anticoag: heparin for CP. On Eliquis PTA for afib - last dose 1/5 2030. Baseline PTT 47 sec.  - aPTT 80 at goal, Heparin level falsely elevated due to Eliquis interaction  Goal of Therapy:  aPTT 66-102 seconds Monitor platelets by anticoagulation protocol: Yes   Plan:  Heparin not resumed s/p cath today. Noted plans to resume Eliquis tomorrow.  [place] Crane, Roylene Reason, BCCP Clinical Pharmacist  05/24/2020 10:58 AM   [facility] pharmacy phone numbers are listed on [email or web address]

## 2020-05-24 NOTE — Progress Notes (Signed)
Progress Note  Patient Name: [name] [name] Date of Encounter: 05/24/2020  [facility] Cardiologist: Carlyle Dolly, MD   Subjective   Intermittent chest discomfort, mostly controlled with nitrates. Troponin remains flat at 260. Persistent marked ST-T changes in the inferior and lateral leads. To cath lab this morning.  Inpatient Medications    Scheduled Meds: . [MAR Hold] clopidogrel  75 mg Oral Daily  . [MAR Hold] dapagliflozin propanediol  5 mg Oral QAC breakfast  . [MAR Hold] DULoxetine  30 mg Oral Daily  . [MAR Hold] fenofibrate  160 mg Oral QHS  . [MAR Hold] furosemide  20 mg Oral Daily  . [MAR Hold] gabapentin  900 mg Oral BID  . [MAR Hold] insulin aspart  15 Units Subcutaneous TID WC  . [MAR Hold] insulin glargine  80 Units Subcutaneous BID  . [MAR Hold] irbesartan  75 mg Oral Daily  . [MAR Hold] metoprolol succinate  100 mg Oral Daily  . [MAR Hold] pantoprazole  40 mg Oral Daily  . sodium chloride flush  3 mL Intravenous Q12H  . [MAR Hold] vitamin B-12  1,000 mcg Oral Daily   Continuous Infusions: . sodium chloride Stopped (05/23/20 1019)  . sodium chloride    . sodium chloride 1 mL/kg/hr (05/24/20 0544)  . heparin Stopped (05/24/20 0704)  . nitroGLYCERIN 20 mcg/min (05/23/20 2253)   PRN Meds: sodium chloride, [MAR Hold] acetaminophen, [MAR Hold] albuterol, fentaNYL, heparin sodium (porcine), [MAR Hold] HYDROcodone-acetaminophen, lidocaine (PF), midazolam, [MAR Hold] nitroGLYCERIN, [MAR Hold] ondansetron (ZOFRAN) IV, Radial Cocktail/Verapamil only, sodium chloride flush   Vital Signs    Vitals:   05/23/20 2223 05/23/20 2253 05/24/20 0353 05/24/20 0745  BP: 97/61 116/61 115/63   Pulse: 71 72 78   Resp: 16 17 14    Temp:   98.4 F (36.9 C)   TempSrc:   Oral   SpO2: 94% 91% 94% 100%  Weight:   132.9 kg   Height:        Intake/Output Summary (Last 24 hours) at 05/24/2020 0815 Last data filed at 05/24/2020 0544 Gross per 24 hour  Intake 300 ml  Output  920 ml  Net -620 ml   Last 3 Weights 05/24/2020 05/23/2020 05/20/2020  Weight (lbs) 293 lb 1.6 oz 290 lb 298 lb 9.6 oz  Weight (kg) 132.949 kg 131.543 kg 135.444 kg  Some encounter information is confidential and restricted. Go to Review Flowsheets activity to see all data.      Telemetry    NSR, very frequent PACs, occasional PVCs - Personally Reviewed  ECG    NSR, prominent inferior and lateral ST depression and T wave inversion - Personally Reviewed  Physical Exam  Obese GEN: No acute distress.   Neck: No JVD Cardiac: RRR, no murmurs, rubs. S4 gallop is present.  Respiratory: Clear to auscultation bilaterally. GI: Soft, nontender, non-distended  MS: No edema; No deformity. Neuro:  Nonfocal  Psych: Normal affect   Labs    High Sensitivity Troponin:   Recent Labs  Lab 05/23/20 0051 05/23/20 0303 05/24/20 0441  TROPONINIHS 218* 265* 260*      Chemistry Recent Labs  Lab 05/20/20 1038 05/23/20 0051 05/24/20 0354  NA 138 131* 136  K 4.5 3.8 4.0  CL 97 95* 101  CO2 26 24 24   GLUCOSE 137* 139* 116*  BUN 24 23 20   CREATININE 1.42* 1.49* 1.48*  CALCIUM 10.1 9.2 9.5  PROT 7.1 7.2  --   ALBUMIN 4.3 3.9  --  AST 22 26  --   ALT 18 24  --   ALKPHOS 51 40  --   BILITOT 0.5 0.8  --   GFRNONAA 50* 50* 51*  GFRAA 58*  --   --   ANIONGAP  --  12 11     Hematology Recent Labs  Lab 05/20/20 1038 05/23/20 0051 05/24/20 0354  WBC 7.9 10.4 8.4  RBC 4.81 4.59 4.12*  HGB 14.1 12.9* 11.7*  HCT 41.9 41.7 37.4*  MCV 87 90.8 90.8  MCH 29.3 28.1 28.4  MCHC 33.7 30.9 31.3  RDW 13.3 13.6 13.8  PLT 365 346 272    BNPNo results for input(s): BNP, PROBNP in the last 168 hours.   DDimer No results for input(s): DDIMER in the last 168 hours.   Radiology    DG Chest Port 1 View  Result Date: 05/23/2020 CLINICAL DATA:  Chest pain since 10:30 p.m. yesterday, history of coronary artery disease EXAM: PORTABLE CHEST 1 VIEW COMPARISON:  01/18/2018 FINDINGS: Single frontal  view of the chest demonstrates stable enlarged cardiac silhouette. No airspace disease, effusion, or pneumothorax. No acute bony abnormalities. IMPRESSION: 1. Stable exam, no acute process. Electronically Signed   By: Randa Ngo M.D.   On: 05/23/2020 01:41    Cardiac Studies   Diagnostic cath shows occluded RCA (chronic) and high grade LCX in-stent restenosis  Patient Profile     70 y.o. male with CAD requiring previous PCI to RCA and LCx, chronic total occlusion of the RCA, borderline LVEF due to inferior wall motion normality, extensive PAD with previous stent to the right lower extremity, paroxysmal atrial fibrillation, controlled type 2 diabetes mellitus on insulin, CKD 3A, severe dyslipidemia with fair LDL cholesterol (not on statin) presents with increased frequency of palpitations, unstable angina symptoms, ECG changes, mildly abnormal high-sensitivity troponin  Assessment & Plan    1.  Unstable angina/NSTEMI: Suspect recurrent stenosis in the left circumflex distribution, high risk ECG and cardiac enzymes. He is already on antiplatelet therapy and beta-blockers. Needs effective lipid-lowering with PCSK9 inh (intolerant to at least 4 different statins). Getting PCI to LCx. 2. Parox AFib: He notes increased episodes of palpitations with tachycardia in the 120s.  The ECG from January 3 shows regular rhythm with coherent atrial activity that may represent ectopic atrial tachycardia or atrial flutter with 2: 1 AV block. Plan Eliquis + clopidogrel after PCI. 3. DM: Hemoglobin A1c less than 7% SGLT2 inhibitor. 4. PAD: He has several small nonhealing ulcer lesions on the anterior left shin and a 1 cm dry gray ulcer on the tip of the right great toe.  He has good capillary refill and warm tips of his toes.  He is seeing Dr. [name], but lower extremity revascularization will have to wait until we clarify his coronary status. 5. HLP: He has a severely decreased HDL cholesterol, residual  hypertriglyceridemia despite treatment with fenofibrate, his LDL is close to target at 75.  Intolerant to multiple statins (documented intolerable myalgias on pravastatin, rosuvastatin, pitavastatin and atorvastatin) consider adding ezetimibe at least, but should probably be on a more potent agent such as Repatha. 6. HTN: Beta-blockers and RAAS inhibitors preferred, but hold irbesartan on day of cardiac catheterization. 7.  Severe obesity (BMI 37) with multiple comorbid conditions     For questions or updates, please contact [place] Please consult [email or web address] for contact info under        Signed, Sanda Klein, MD  05/24/2020, 8:15 AM

## 2020-05-24 NOTE — Interval H&P Note (Signed)
History and Physical Interval Note:  05/24/2020 7:35 AM  [name] [name]  has presented today for surgery, with the diagnosis of NSTEMI.  The various methods of treatment have been discussed with the patient and family. After consideration of risks, benefits and other options for treatment, the patient has consented to  Procedure(s): LEFT HEART CATH AND CORONARY ANGIOGRAPHY (N/A) as a surgical intervention.  The patient's history has been reviewed, patient examined, no change in status, stable for surgery.  I have reviewed the patient's chart and labs.  Questions were answered to the patient's satisfaction.    Cath Lab Visit (complete for each Cath Lab visit)  Clinical Evaluation Leading to the Procedure:   ACS: Yes.    Non-ACS:    Anginal Classification: CCS III  Anti-ischemic medical therapy: Maximal Therapy (2 or more classes of medications)  Non-Invasive Test Results: No non-invasive testing performed  Prior CABG: No previous CABG        Lauree Chandler

## 2020-05-25 DIAGNOSIS — I214 Non-ST elevation (NSTEMI) myocardial infarction: Secondary | ICD-10-CM | POA: Diagnosis not present

## 2020-05-25 LAB — BASIC METABOLIC PANEL
Anion gap: 9 (ref 5–15)
BUN: 16 mg/dL (ref 8–23)
CO2: 27 mmol/L (ref 22–32)
Calcium: 9.7 mg/dL (ref 8.9–10.3)
Chloride: 100 mmol/L (ref 98–111)
Creatinine, Ser: 1.48 mg/dL — ABNORMAL HIGH (ref 0.61–1.24)
GFR, Estimated: 51 mL/min — ABNORMAL LOW (ref >60)
Glucose, Bld: 57 mg/dL — ABNORMAL LOW (ref 70–99)
Potassium: 4.1 mmol/L (ref 3.5–5.1)
Sodium: 136 mmol/L (ref 135–145)

## 2020-05-25 LAB — CBC
HCT: 37.1 % — ABNORMAL LOW (ref 39.0–52.0)
Hemoglobin: 11.9 g/dL — ABNORMAL LOW (ref 13.0–17.0)
MCH: 29 pg (ref 26.0–34.0)
MCHC: 32.1 g/dL (ref 30.0–36.0)
MCV: 90.5 fL (ref 80.0–100.0)
Platelets: 291 10*3/uL (ref 150–400)
RBC: 4.1 MIL/uL — ABNORMAL LOW (ref 4.22–5.81)
RDW: 13.9 % (ref 11.5–15.5)
WBC: 11.1 10*3/uL — ABNORMAL HIGH (ref 4.0–10.5)
nRBC: 0 % (ref 0.0–0.2)

## 2020-05-25 LAB — GLUCOSE, CAPILLARY
Glucose-Capillary: 124 mg/dL — ABNORMAL HIGH (ref 70–99)
Glucose-Capillary: 45 mg/dL — ABNORMAL LOW (ref 70–99)
Glucose-Capillary: 64 mg/dL — ABNORMAL LOW (ref 70–99)
Glucose-Capillary: 66 mg/dL — ABNORMAL LOW (ref 70–99)
Glucose-Capillary: 76 mg/dL (ref 70–99)
Glucose-Capillary: 78 mg/dL (ref 70–99)

## 2020-05-25 LAB — APTT: aPTT: 46 seconds — ABNORMAL HIGH (ref 24–36)

## 2020-05-25 MED ORDER — APIXABAN 5 MG PO TABS
5.0000 mg | ORAL_TABLET | Freq: Two times a day (BID) | ORAL | Status: DC
  Administered 2020-05-25: 5 mg via ORAL
  Filled 2020-05-25: qty 1

## 2020-05-25 MED ORDER — ASPIRIN 81 MG PO TBEC: 81.0000 mg | DELAYED_RELEASE_TABLET | Freq: Every day | ORAL | 0 refills | Status: DC

## 2020-05-25 MED ORDER — INSULIN GLARGINE 100 UNIT/ML ~~LOC~~ SOLN
10.0000 [IU] | Freq: Two times a day (BID) | SUBCUTANEOUS | Status: DC
  Filled 2020-05-25: qty 0.1

## 2020-05-25 NOTE — Progress Notes (Signed)
CARDIAC REHAB PHASE I   PRE:  Rate/Rhythm: 80 SR  BP:  Supine: 144/72  Sitting:   Standing:    SaO2: 94%RA  MODE:  Ambulation: 350 ft   POST:  Rate/Rhythm: 112 ST PACS  BP:  Supine:   Sitting: 162/74  Standing:    SaO2: 96%RA 0948-1100 Pt walked 350 ft on RA with steady gait. Stopped twice to rest due to back pain. Stated mobility affected by knees and back. No CP. MI education completed with pt and wife who voiced understanding. Stressed importance of plavix with stent. Reviewed NTG use, MI restrictions, encouraged walking as tolerated, gave diabetic and heart healthy diets. Discussed CRP 2 and referred to [place].  Graylon Good, RN BSN  05/25/2020 10:52 AM

## 2020-05-25 NOTE — Progress Notes (Addendum)
Pt's RN called due to hypoglycemia, will hold lantus this AM and was told pt does not take lantus, I only see SSI on his home meds.  I decreased lantus her from 80 BID to 10 BID

## 2020-05-25 NOTE — Discharge Summary (Signed)
Discharge Summary    Patient ID: [name] [name],  MRN: [id number], DOB/AGE: 70-31-1952 70 y.o.  Admit date: 05/23/2020 Discharge date: 05/25/2020  Primary Care Provider: [name] A Primary Cardiologist: Jenkins Rouge, MD  EP: Dr. Lovena Le  Discharge Diagnoses    Principal Problem:   Non-ST elevation (NSTEMI) myocardial infarction [facility]) Active Problems:   Hyperlipidemia associated with type 2 diabetes mellitus ([place])   Hypertension associated with diabetes ([place])   Diabetes ([place])   Morbid obesity ([place])   PAF (paroxysmal atrial fibrillation) ([place])   History of Present Illness     [name] [name] is a 70 y.o. male with past medical history of CAD (s/p DES to RCA in 2010, cath in 07/2016 showing CTO of RCA with collaterals, did receive DESx2 to LCx and OM1, repeat cath in09/2019 showing patent stents along LCx and OM with CTO of D2, CTO of distal LCx, and CTO of RCA with collaterals present overall unchanged since 2018 with medical management recommended), paroxysmal atrial fibrillation, HTN, HLD(intolerant to statins and Repatha), IDDM, and prior CVA who presented to [facility] ED on 05/23/2020 for evaluation of chest pain.  He reported having intermittent episodes of chest pain over the past week along with intermittent palpitations resembling his known atrial fibrillation. His episodes of chest pain started to become more frequent and would radiate into his arms, therefore he called EMS.   CODE STEMI was initially called but his EKG tracing was not felt to be consistent with this. He was found to have an NSTEMI with peak HS Troponin at 265. Given he was on Eliquis prior to admission, his catheterization was delayed until 05/24/2020.   Hospital Course     Consultants: None  His repeat catheterization showed moderate mid-LAD stenosis which was similar to his prior cath and he had a known CTO of D1 and CTO of RCA and distal LCx. Cath did show severe restenosis of the proximal LCx and  OM1 which was treated with 2 overlapping DES. He tolerated the procedure well and no complications were noted. It was mentioned in the cath report to continue Plavix and continue ASA until fully anticoagulated. This was reviewed with Dr. Harrington Challenger and she recommended continuing ASA for 30 days unless he experienced any issues with bleeding.  The morning after the procedure, he denied any recurrent chest pain and felt well. He ambulated over 350 ft with cardiac rehab without anginal symptoms. He was continued on his PTA Toprol-XL and Fenofibrate with Eliquis being restarted as his radial catheterization site remained stable. He did have intermittent episodes of paroxysmal atrial fibrillation during admission and it was recommended to titrate his Toprol-XL as an outpatient if he continued to have palpitations further out from his acute event. His LDL was at 75 during admission as he was continued on his current regimen as he is on Fenofibrate and currently enrolled in the CLEAR trial.   He was last examined by Dr. Harrington Challenger and deemed stable for discharge. General Cardiology follow-up has been arranged in our [place] office and he has previously scheduled follow-up with Dr. Lovena Le on 06/05/2020.  _____________  Discharge Vitals Blood pressure (!) 152/74, pulse 78, temperature 98.7 F (37.1 C), temperature source Oral, resp. rate 19, height 6\' 2"  (1.88 m), weight 132.9 kg, SpO2 98 %.  Filed Weights   05/23/20 0056 05/24/20 0353  Weight: 131.5 kg 132.9 kg    Labs & Radiologic Studies     CBC Recent Labs  05/23/20 0051 05/24/20 0354 05/25/20 0223  WBC 10.4 8.4 11.1*  NEUTROABS 6.7  --   --   HGB 12.9* 11.7* 11.9*  HCT 41.7 37.4* 37.1*  MCV 90.8 90.8 90.5  PLT 346 272 [id number]   Basic Metabolic Panel Recent Labs    05/24/20 0354 05/25/20 0223  NA 136 136  K 4.0 4.1  CL 101 100  CO2 24 27  GLUCOSE 116* 57*  BUN 20 16  CREATININE 1.48* 1.48*  CALCIUM 9.5 9.7   Liver Function  Tests Recent Labs    05/23/20 0051  AST 26  ALT 24  ALKPHOS 40  BILITOT 0.8  PROT 7.2  ALBUMIN 3.9   No results for input(s): LIPASE, AMYLASE in the last 72 hours. Cardiac Enzymes No results for input(s): CKTOTAL, CKMB, CKMBINDEX, TROPONINI in the last 72 hours. BNP Invalid input(s): POCBNP D-Dimer No results for input(s): DDIMER in the last 72 hours. Hemoglobin A1C Recent Labs    05/23/20 0051  HGBA1C 6.7*   Fasting Lipid Panel Recent Labs    05/23/20 0051  CHOL 172  HDL 21*  LDLCALC 75  TRIG 378*  CHOLHDL 8.2   Thyroid Function Tests No results for input(s): TSH, T4TOTAL, T3FREE, THYROIDAB in the last 72 hours.  Invalid input(s): FREET3  CARDIAC CATHETERIZATION  Result Date: 05/24/2020  Prox LAD lesion is 40% stenosed.  Ost 1st Diag lesion is 40% stenosed.  Ost 2nd Diag to 2nd Diag lesion is 100% stenosed.  Mid Cx to Dist Cx lesion is 100% stenosed.  Prox RCA to Mid RCA lesion is 90% stenosed.  Prox RCA lesion is 99% stenosed.  Ost 1st Mrg to 1st Mrg lesion is 80% stenosed.  A drug-eluting stent was successfully placed using a STENT RESOLUTE ONYX [id number].  Post intervention, there is a 0% residual stenosis.  Prox Cx lesion is 95% stenosed.  A drug-eluting stent was successfully placed using a STENT RESOLUTE ONYX 3.0X22.  Post intervention, there is a 0% residual stenosis.  1. Moderate mid LAD stenosis unchanged. Chronic occlusion Diagonal 1. Severe disease small diagonal 2. 2. Severe restenosis Circumflex/OM stents. 3. Successful PTCA/DES x 2 with overlapping Resolute Onyx DES in the Circumflex/OM 4. Chronic occlusion mid RCA in old stented segment. The distal vessel fills briskly from left to right collaterals. Recommendations: Continue Plavix. Resume anticoagulation tomorrow. Will continue ASA 81 mg daily until he is fully anti-coagulated then OK to stop ASA.   DG Chest Port 1 View  Result Date: 05/23/2020 CLINICAL DATA:  Chest pain since 10:30 p.m.  yesterday, history of coronary artery disease EXAM: PORTABLE CHEST 1 VIEW COMPARISON:  01/18/2018 FINDINGS: Single frontal view of the chest demonstrates stable enlarged cardiac silhouette. No airspace disease, effusion, or pneumothorax. No acute bony abnormalities. IMPRESSION: 1. Stable exam, no acute process. Electronically Signed   By: Randa Ngo M.D.   On: 05/23/2020 01:41     Diagnostic Studies/Procedures    Cardiac Catheterization: 05/24/2020  Prox LAD lesion is 40% stenosed.  Ost 1st Diag lesion is 40% stenosed.  Ost 2nd Diag to 2nd Diag lesion is 100% stenosed.  Mid Cx to Dist Cx lesion is 100% stenosed.  Prox RCA to Mid RCA lesion is 90% stenosed.  Prox RCA lesion is 99% stenosed.  Ost 1st Mrg to 1st Mrg lesion is 80% stenosed.  A drug-eluting stent was successfully placed using a STENT RESOLUTE ONYX [id number].  Post intervention, there is a 0% residual stenosis.  Prox Cx lesion is 95% stenosed.  A drug-eluting stent was successfully placed using a STENT RESOLUTE ONYX 3.0X22.  Post intervention, there is a 0% residual stenosis.  1. Moderate mid LAD stenosis unchanged. Chronic occlusion Diagonal 1. Severe disease small diagonal 2.  2. Severe restenosis Circumflex/OM stents.  3. Successful PTCA/DES x 2 with overlapping Resolute Onyx DES in the Circumflex/OM 4. Chronic occlusion mid RCA in old stented segment. The distal vessel fills briskly from left to right collaterals.   Recommendations: Continue Plavix. Resume anticoagulation tomorrow. Will continue ASA 81 mg daily until he is fully anti-coagulated then OK to stop ASA.    Disposition   Pt is being discharged home today in good condition.  Follow-up Plans & Appointments     Follow-up Information    Erma Heritage, PA-C Follow up on 06/14/2020.   Specialties: Physician Assistant, Cardiology Why: Cardiology Hospital Follow-up on 06/14/2020 at 3:30 PM Contact information: [name]  [id number] [phone]        [name], MD Follow up on 06/05/2020.   Specialty: Cardiology Why: Keep scheduled follow-up for 06/05/2020 at 1:15 PM.  Contact information: [name] [id number] [phone]              Discharge Instructions    Amb Referral to Cardiac Rehabilitation   Complete by: As directed    Diagnosis:  Coronary Stents NSTEMI     After initial evaluation and assessments completed: Virtual Based Care may be provided alone or in conjunction with Phase 2 Cardiac Rehab based on patient barriers.: Yes   Diet - low sodium heart healthy   Complete by: As directed    Discharge instructions   Complete by: As directed    PLEASE REMEMBER TO BRING ALL OF YOUR MEDICATIONS TO EACH OF [place].  PLEASE ATTEND ALL SCHEDULED FOLLOW-UP APPOINTMENTS.   Activity: Increase activity slowly as tolerated. You may shower, but no soaking baths (or swimming) for 1 week. No driving for 24 hours. No lifting over 5 lbs for 1 week. No sexual activity for 1 week.   You May Return to Work: in 1 week (if applicable)  Wound Care: You may wash cath site gently with soap and water. Keep cath site clean and dry. If you notice pain, swelling, bleeding or pus at your cath site, please call [phone].   Increase activity slowly   Complete by: As directed       Discharge Medications     Medication List    TAKE these medications   albuterol 108 (90 Base) MCG/ACT inhaler Commonly known as: VENTOLIN HFA Inhale 2 puffs into the lungs every 6 (six) hours as needed for wheezing or shortness of breath.   ALKA-SELTZER PLUS COLD PO Take 2 tablets by mouth daily as needed (allergies).   AMBULATORY NON FORMULARY MEDICATION Take 180 mg by mouth daily at 12 noon. Medication Name: Clear Study Drug-Bempodic acid versus placebo   apixaban 5 MG Tabs tablet Commonly known as: Eliquis Take 1 tablet (5 mg total) by mouth 2 (two) times daily.   aspirin 81  MG EC tablet Take 1 tablet (81 mg total) by mouth daily. Swallow whole. Start taking on: May 26, 2020 Notes to patient: Will be on this for 30 days from stent placement.    BLUE-EMU MAXIMUM STRENGTH EX Apply 1 application topically daily as needed (back pain).   clopidogrel 75 MG tablet Commonly known as: PLAVIX Take 1 tablet by mouth once daily  dapagliflozin propanediol 5 MG Tabs tablet Commonly known as: Farxiga Take 1 tablet (5 mg total) by mouth daily before breakfast.   DULoxetine 30 MG capsule Commonly known as: CYMBALTA Take 1 capsule (30 mg total) by mouth daily.   fenofibrate 160 MG tablet TAKE 1 TABLET BY MOUTH ONCE DAILY FOR CHOLESTEROL AND TRIGLYCERIDES What changed:   how much to take  how to take this  when to take this  additional instructions   furosemide 20 MG tablet Commonly known as: LASIX TAKE 1 TABLET BY MOUTH ONCE DAILY AS NEEDED FOR EDEMA What changed: See the new instructions.   gabapentin 300 MG capsule Commonly known as: NEURONTIN TAKE 2 CAPSULES BY MOUTH THREE TIMES DAILY What changed:   how much to take  when to take this   HumaLOG KwikPen 100 UNIT/ML KwikPen Generic drug: insulin lispro INJECT 5 TO 20 UNITS SUBCUTANEOUSLY THREE TIMES DAILY WITH MEALS What changed: See the new instructions.   HYDROcodone-acetaminophen 10-325 MG tablet Commonly known as: NORCO Take 1 tablet by mouth every 6 (six) hours as needed for moderate pain.   isosorbide mononitrate 30 MG 24 hr tablet Commonly known as: IMDUR Take 1 tablet (30 mg total) by mouth daily.   linaclotide 72 MCG capsule Commonly known as: Linzess Take 1 capsule (72 mcg total) by mouth daily as needed (constipation).   metoprolol succinate 50 MG 24 hr tablet Commonly known as: TOPROL-XL Take 1 tablet by mouth once daily   metoprolol tartrate 50 MG tablet Commonly known as: LOPRESSOR Take 50 mg by mouth daily as needed (a-fib).   nitroGLYCERIN 0.4 MG SL  tablet Commonly known as: NITROSTAT Place 1 tablet (0.4 mg total) under the tongue every 5 (five) minutes x 3 doses as needed for chest pain.   Ozempic (1 MG/DOSE) 2 MG/1.5ML Sopn Generic drug: Semaglutide (1 MG/DOSE) Inject 0.75 mLs (1 mg total) into the skin once a week. What changed: when to take this   pantoprazole 40 MG tablet Commonly known as: PROTONIX Take 1 tablet by mouth once daily   telmisartan 20 MG tablet Commonly known as: MICARDIS Take 1 tablet (20 mg total) by mouth daily.   Evaristo Bury FlexTouch 200 UNIT/ML FlexTouch Pen Generic drug: insulin degludec Inject 80 Units into the skin in the morning and at bedtime.   triamcinolone 55 MCG/ACT Aero nasal inhaler Commonly known as: NASACORT Place 1 spray into the nose as needed (allergies).   TYLENOL PM EXTRA STRENGTH PO Take 1 tablet by mouth at bedtime as needed (pain).   vitamin B-12 1000 MCG tablet Commonly known as: CYANOCOBALAMIN Take 1,000 mcg by mouth daily.         Allergies Allergies  Allergen Reactions  . Shellfish Allergy Anaphylaxis and Other (See Comments)    Tongue swelling, hives   . Sulfa Antibiotics Anaphylaxis and Rash    Tongue swelling, hives  . Ace Inhibitors Other (See Comments) and Cough    CKD, renal failure   . Invokana [Canagliflozin] Other (See Comments)    Syncope / dehydration  . Lisinopril Cough  . Metformin And Related Itching  . Pravastatin Sodium Other (See Comments)    myalgias  . Milk-Related Compounds     Ties stomach in knots   . Crestor [Rosuvastatin] Other (See Comments)    Myalgias   . Fenofibrate Other (See Comments)    Body aches - pt currently taking isnt sure if its causing any pain  . Horse-Derived Products Rash  . Iodine Other (See  Comments)    ????. PATIENT SAYS HE DID FINE WITH LAST CT WITH CONTRAST** patient had IV contrast on 06/21/2018 without pre meds without any issues. Patient had allergic reaction to Shellfish 30 years ago-not to IV contrast.    . Lexapro [Escitalopram Oxalate] Other (See Comments)    Buzzing in ears,headache, felt like a zombie  . Lipitor [Atorvastatin] Other (See Comments)    myalgias  . Livalo [Pitavastatin] Other (See Comments)    Myalgias   . Repatha [Evolocumab] Other (See Comments)    Myalgias, flu like sx  . Tape Rash     Yes                               AHA/ACC Clinical Performance & Quality Measures: 1. Aspirin prescribed? - Yes 2. ADP Receptor Inhibitor (Plavix/Clopidogrel, Brilinta/Ticagrelor or Effient/Prasugrel) prescribed (includes medically managed patients)? - Yes 3. Beta Blocker prescribed? - Yes 4. High Intensity Statin (Lipitor 40-80mg  or Crestor 20-40mg ) prescribed? - No - Statin intolerant 5. EF assessed during THIS hospitalization? - Yes - By cath, repeat echo not performed 6. For EF <40%, was ACEI/ARB prescribed? - Not Applicable (EF >/= 54%) 7. For EF <40%, Aldosterone Antagonist (Spironolactone or Eplerenone) prescribed? - Not Applicable (EF >/= 56%) 8. Cardiac Rehab Phase II ordered (including medically managed patients)? - Yes    Outstanding Labs/Studies   None  Duration of Discharge Encounter   Greater than 30 minutes including physician time.  Signed, Erma Heritage, PA-C 05/25/2020, 1:41 PM

## 2020-05-25 NOTE — Progress Notes (Signed)
Hypoglycemic Event  CBG: 45 at 0738   Treatment: 8 oz juice/soda and graham crackers  Symptoms: Shaky  Follow-up CBG: Time: 0809 CBG Result:64 - administered 4oz OJ  Follow up CBG at 0827 CBG result: 66 - administered 4oz OJ, and pt had breakfast  Follow - up CBG result: 76 at 0927  Possible Reasons for Event: Medication regimen and change in activity (pt eats much different at home)  Comments/MD notified:Laura Dorene Ar, NP was notified and she cut down his Lantus.    Idolina Primer

## 2020-05-25 NOTE — Progress Notes (Addendum)
Progress Note  Patient Name: [name] [name] Date of Encounter: 05/25/2020  Primary Cardiologist: Carlyle Dolly, MD   Subjective   "Feels like a new man" this morning. No recurrent chest pain or dyspnea. Ambulated with cardiac rehab without recurrent anginal symptoms. Does have joint pain at baseline.   Inpatient Medications    Scheduled Meds: . aspirin EC  81 mg Oral Daily  . clopidogrel  75 mg Oral Daily  . dapagliflozin propanediol  5 mg Oral QAC breakfast  . DULoxetine  30 mg Oral Daily  . fenofibrate  160 mg Oral QHS  . furosemide  20 mg Oral Daily  . gabapentin  900 mg Oral BID  . insulin aspart  15 Units Subcutaneous TID WC  . insulin glargine  10 Units Subcutaneous BID  . irbesartan  75 mg Oral Daily  . metoprolol succinate  100 mg Oral Daily  . pantoprazole  40 mg Oral Daily  . sodium chloride flush  3 mL Intravenous Q12H  . vitamin B-12  1,000 mcg Oral Daily   Continuous Infusions: . sodium chloride Stopped (05/23/20 1019)  . sodium chloride     PRN Meds: sodium chloride, acetaminophen, albuterol, HYDROcodone-acetaminophen, nitroGLYCERIN, ondansetron (ZOFRAN) IV, sodium chloride flush   Vital Signs    Vitals:   05/24/20 1044 05/24/20 1743 05/24/20 2129 05/25/20 0428  BP: (!) 181/87 (!) 128/56 137/61 (!) 142/66  Pulse: 85 75 78 75  Resp:  16 19 19   Temp:  98.3 F (36.8 C) 98 F (36.7 C) 98.7 F (37.1 C)  TempSrc:  Oral Oral Oral  SpO2:  99% 100% 98%  Weight:      Height:        Intake/Output Summary (Last 24 hours) at 05/25/2020 0956 Last data filed at 05/25/2020 0653 Gross per 24 hour  Intake 360 ml  Output 720 ml  Net -360 ml    Last 3 Weights 05/24/2020 05/23/2020 05/20/2020  Weight (lbs) 293 lb 1.6 oz 290 lb 298 lb 9.6 oz  Weight (kg) 132.949 kg 131.543 kg 135.444 kg  Some encounter information is confidential and restricted. Go to Review Flowsheets activity to see all data.      Telemetry    NSR, HR in 70's to 80's. Episodes of atrial  fibrillation noted with rates in the 120's at times with spontaneous conversion back to NSR.  - Personally Reviewed  ECG    NSR, HR 75 with PAC's in a bigeminal pattern and TWI along lateral leads.  - Personally Reviewed  Physical Exam   General: Well developed, obese male appearing in no acute distress. Head: Normocephalic, atraumatic.  Neck: Supple without bruits, JVD not elevated. Lungs:  Resp regular and unlabored, CTA without wheezing or rales. Heart: RRR, S1, S2, no S3, S4, or murmur; no rub. Abdomen: Soft, non-tender, non-distended with normoactive bowel sounds. No hepatomegaly. No rebound/guarding. No obvious abdominal masses. Extremities: No clubbing, cyanosis, or edema. Distal pedal pulses are 2+ bilaterally. Pressure dressing is off. Radial site stable without evidence of a hematoma.  Neuro: Alert and oriented X 3. Moves all extremities spontaneously. Psych: Normal affect.  Labs    Chemistry Recent Labs  Lab 05/20/20 1038 05/23/20 0051 05/24/20 0354 05/25/20 0223  NA 138 131* 136 136  K 4.5 3.8 4.0 4.1  CL 97 95* 101 100  CO2 26 24 24 27   GLUCOSE 137* 139* 116* 57*  BUN 24 23 20 16   CREATININE 1.42* 1.49* 1.48* 1.48*  CALCIUM 10.1 9.2 9.5  9.7  PROT 7.1 7.2  --   --   ALBUMIN 4.3 3.9  --   --   AST 22 26  --   --   ALT 18 24  --   --   ALKPHOS 51 40  --   --   BILITOT 0.5 0.8  --   --   GFRNONAA 50* 50* 51* 51*  GFRAA 58*  --   --   --   ANIONGAP  --  12 11 9      Hematology Recent Labs  Lab 05/23/20 0051 05/24/20 0354 05/25/20 0223  WBC 10.4 8.4 11.1*  RBC 4.59 4.12* 4.10*  HGB 12.9* 11.7* 11.9*  HCT 41.7 37.4* 37.1*  MCV 90.8 90.8 90.5  MCH 28.1 28.4 29.0  MCHC 30.9 31.3 32.1  RDW 13.6 13.8 13.9  PLT 346 272 291    Cardiac EnzymesNo results for input(s): TROPONINI in the last 168 hours. No results for input(s): TROPIPOC in the last 168 hours.   BNPNo results for input(s): BNP, PROBNP in the last 168 hours.   DDimer No results for input(s):  DDIMER in the last 168 hours.   Radiology     Cardiac Studies   Cardiac Catheterization: 05/24/2020  Prox LAD lesion is 40% stenosed.  Ost 1st Diag lesion is 40% stenosed.  Ost 2nd Diag to 2nd Diag lesion is 100% stenosed.  Mid Cx to Dist Cx lesion is 100% stenosed.  Prox RCA to Mid RCA lesion is 90% stenosed.  Prox RCA lesion is 99% stenosed.  Ost 1st Mrg to 1st Mrg lesion is 80% stenosed.  A drug-eluting stent was successfully placed using a STENT RESOLUTE ONYX [id number].  Post intervention, there is a 0% residual stenosis.  Prox Cx lesion is 95% stenosed.  A drug-eluting stent was successfully placed using a STENT RESOLUTE ONYX 3.0X22.  Post intervention, there is a 0% residual stenosis.   1. Moderate mid LAD stenosis unchanged. Chronic occlusion Diagonal 1. Severe disease small diagonal 2.  2. Severe restenosis Circumflex/OM stents.  3. Successful PTCA/DES x 2 with overlapping Resolute Onyx DES in the Circumflex/OM 4. Chronic occlusion mid RCA in old stented segment. The distal vessel fills briskly from left to right collaterals.   Recommendations: Continue Plavix. Resume anticoagulation tomorrow. Will continue ASA 81 mg daily until he is fully anti-coagulated then OK to stop ASA.    Patient Profile     70 y.o. male w/PMH of CAD (s/p DES to RCA in 2010, cath in 07/2016 showing CTO of RCA with collaterals, did receive DESx2 to LCx and OM1, repeat cath in 01/2018 showing patent stents along LCx and OM with CTO of D2, CTO of distal LCx, and CTO of RCA with collaterals present overall unchanged since 2018 with medical management recommended), paroxysmal atrial fibrillation, HTN, HLD (intolerant to statins and Repatha), IDDM, and prior CVA who is currently admitted for an NSTEMI.   Assessment & Plan    1. CAD/NSTEMI - Presented with recurrent chest pain which started the evening of admission and radiated into his left arm. Initially called as a CODE STEMI but EKG's not  consistent with this and CODE STEMI was cancelled.  - HS Troponin values peaked at 265 and he underwent a repeat catheterization yesterday which showed moderate mid-LAD stenosis which was similar to his prior cath and he had a known CTO of D1 and CTO of RCA and distal LCx. Cath did show severe restenosis of the proximal LCx and OM1 which was  treated with 2 overlapping DES.  - Was recommended to continue Plavix and continue ASA 81mg  daily until fully anticoagulated. Will review with MD how long to continue ASA given resumption of Eliquis today. Continue Toprol-XL and Fenofibrate. Intolerant to statins and Repatha in the past by review of allergies.   2. Paroxysmal Atrial Fibrillation - He did report episodes of palpitations prior to admission and has experienced intermittent episodes of atrial fibrillation this admission. Continue Toprol-XL 100mg  daily and PRN Lopressor. If he continues to have palpitations, would recommend titration of his Toprol-XL at follow-up.   - Will restart PTA Eliquis 5mg  BID as outlined in his cath notes.   3. HLD - FLP this admission shows total cholesterol of 172, HDL 21, triglycerides 378 and LDL 75 which is much improved when compared to his prior readings. He has been on Fenofibrate given his intolerances to multiple statin and intolerances to Repatha in the past. Would consider trying Zetia 10mg  daily as this is not listed as one of his allergies.   4. Type 2 DM - Hgb A1c at 6.7 this admission. Will continue his PTA medication regimen at the time of discharge, including [place]. He did have episodes of hypoglycemia this admission and will need to follow his glucose readings closely at home and follow-up with his PCP if this trend continues.   5. PAD - He is followed by Dr. [name] as an outpatient for nonhealing ulcers along his LLE. Remains on Plavix and anticoagulation.    For questions or updates, please contact [place] Please consult [email or web address] for  contact info under Cardiology/STEMI.   Signed, Erma Heritage , PA-C 9:56 AM 05/25/2020 Pager: [phone]  Patient seen and examined   I agree with findings as noted above by B Strader Pt doing well post intervention. Plan for d/c home today  REsuming Eliquis   Will leave on ASA and Plavix.  Confirm length with interventional service  Follow up in clinic   Dorris Carnes MD

## 2020-05-27 ENCOUNTER — Telehealth: Payer: Self-pay | Admitting: Family Medicine

## 2020-05-27 NOTE — Telephone Encounter (Signed)
Contact Date: 05/27/2020 Contacted By: [place]  Transition Care Management Follow-up Telephone Call  Date of discharge and from where: [facility] 05/25/2020  Discharge Diagnosis:Non-ST elevation (NSTEMI) myocardial infarction  How have you been since you were released from the hospital? Feeling very fatigue   Any questions or concerns? No   Items Reviewed:  Did the pt receive and understand the discharge instructions provided? Yes   Medications obtained and verified? Yes   Any new allergies since your discharge? Yes   Dietary orders reviewed? Yes  Do you have support at home? Yes   Discontinued Medications  None   New Medications Added ASA 81 mg  Current Medication List Allergies as of 05/27/2020      Reactions   Shellfish Allergy Anaphylaxis, Other (See Comments)   Tongue swelling, hives   Sulfa Antibiotics Anaphylaxis, Rash   Tongue swelling, hives   Ace Inhibitors Other (See Comments), Cough   CKD, renal failure    Invokana [canagliflozin] Other (See Comments)   Syncope / dehydration   Lisinopril Cough   Metformin And Related Itching   Pravastatin Sodium Other (See Comments)   myalgias   Milk-related Compounds    Ties stomach in knots    Crestor [rosuvastatin] Other (See Comments)   Myalgias   Fenofibrate Other (See Comments)   Body aches - pt currently taking isnt sure if its causing any pain   Horse-derived Products Rash   Iodine Other (See Comments)   ????. PATIENT SAYS HE DID FINE WITH LAST CT WITH CONTRAST** patient had IV contrast on 06/21/2018 without pre meds without any issues. Patient had allergic reaction to Shellfish 30 years ago-not to IV contrast.    Lexapro [escitalopram Oxalate] Other (See Comments)   Buzzing in ears,headache, felt like a zombie   Lipitor [atorvastatin] Other (See Comments)   myalgias   Livalo [pitavastatin] Other (See Comments)   Myalgias   Repatha [evolocumab] Other (See Comments)   Myalgias, flu like sx   Tape Rash       Medication List       Accurate as of May 27, 2020  8:18 AM. If you have any questions, ask your nurse or doctor.        albuterol 108 (90 Base) MCG/ACT inhaler Commonly known as: VENTOLIN HFA Inhale 2 puffs into the lungs every 6 (six) hours as needed for wheezing or shortness of breath.   ALKA-SELTZER PLUS COLD PO Take 2 tablets by mouth daily as needed (allergies).   AMBULATORY NON FORMULARY MEDICATION Take 180 mg by mouth daily at 12 noon. Medication Name: Clear Study Drug-Bempodic acid versus placebo   apixaban 5 MG Tabs tablet Commonly known as: Eliquis Take 1 tablet (5 mg total) by mouth 2 (two) times daily.   aspirin 81 MG EC tablet Take 1 tablet (81 mg total) by mouth daily. Swallow whole.   BLUE-EMU MAXIMUM STRENGTH EX Apply 1 application topically daily as needed (back pain).   clopidogrel 75 MG tablet Commonly known as: PLAVIX Take 1 tablet by mouth once daily   dapagliflozin propanediol 5 MG Tabs tablet Commonly known as: Farxiga Take 1 tablet (5 mg total) by mouth daily before breakfast.   DULoxetine 30 MG capsule Commonly known as: CYMBALTA Take 1 capsule (30 mg total) by mouth daily.   fenofibrate 160 MG tablet TAKE 1 TABLET BY MOUTH ONCE DAILY FOR CHOLESTEROL AND TRIGLYCERIDES What changed:   how much to take  how to take this  when to take this  additional instructions   furosemide 20 MG tablet Commonly known as: LASIX TAKE 1 TABLET BY MOUTH ONCE DAILY AS NEEDED FOR EDEMA What changed: See the new instructions.   gabapentin 300 MG capsule Commonly known as: NEURONTIN TAKE 2 CAPSULES BY MOUTH THREE TIMES DAILY What changed:   how much to take  when to take this   HumaLOG KwikPen 100 UNIT/ML KwikPen Generic drug: insulin lispro INJECT 5 TO 20 UNITS SUBCUTANEOUSLY THREE TIMES DAILY WITH MEALS What changed: See the new instructions.   HYDROcodone-acetaminophen 10-325 MG tablet Commonly known as: NORCO Take 1 tablet by  mouth every 6 (six) hours as needed for moderate pain.   isosorbide mononitrate 30 MG 24 hr tablet Commonly known as: IMDUR Take 1 tablet (30 mg total) by mouth daily.   linaclotide 72 MCG capsule Commonly known as: Linzess Take 1 capsule (72 mcg total) by mouth daily as needed (constipation).   metoprolol succinate 50 MG 24 hr tablet Commonly known as: TOPROL-XL Take 1 tablet by mouth once daily   metoprolol tartrate 50 MG tablet Commonly known as: LOPRESSOR Take 50 mg by mouth daily as needed (a-fib).   nitroGLYCERIN 0.4 MG SL tablet Commonly known as: NITROSTAT Place 1 tablet (0.4 mg total) under the tongue every 5 (five) minutes x 3 doses as needed for chest pain.   Ozempic (1 MG/DOSE) 2 MG/1.5ML Sopn Generic drug: Semaglutide (1 MG/DOSE) Inject 0.75 mLs (1 mg total) into the skin once a week. What changed: when to take this   pantoprazole 40 MG tablet Commonly known as: PROTONIX Take 1 tablet by mouth once daily   telmisartan 20 MG tablet Commonly known as: MICARDIS Take 1 tablet (20 mg total) by mouth daily.   Tyler Aas FlexTouch 200 UNIT/ML FlexTouch Pen Generic drug: insulin degludec Inject 80 Units into the skin in the morning and at bedtime.   triamcinolone 55 MCG/ACT Aero nasal inhaler Commonly known as: NASACORT Place 1 spray into the nose as needed (allergies).   TYLENOL PM EXTRA STRENGTH PO Take 1 tablet by mouth at bedtime as needed (pain).   vitamin B-12 1000 MCG tablet Commonly known as: CYANOCOBALAMIN Take 1,000 mcg by mouth daily.        Home Care and Equipment/Supplies: Were home health services ordered? no If so, what is the name of the agency? NA  Has the agency set up a time to come to the patient's home? not applicable Were any new equipment or medical supplies ordered?  No What is the name of the medical supply agency? NA Were you able to get the supplies/equipment? no Do you have any questions related to the use of the equipment or  supplies? No  Functional Questionnaire: (I = Independent and D = Dependent) ADLs: I   Bathing/Dressing- I  Meal Prep- I  Eating- I  Maintaining continence- I   Transferring/Ambulation- I  Managing Meds- I   Follow up appointments reviewed:   PCP Hospital f/u appt confirmed? Yes  Scheduled to see [facility]  on 05/31/2020 @ 8:10am.  [facility] f/u appt confirmed? No    Are transportation arrangements needed? No  If their condition worsens, is the pt aware to call PCP or go to the Emergency Dept.? Yes  Was the patient provided with contact information for the PCP's office or ED?Yes  Was to pt encouraged to call back with questions or concerns? Yes

## 2020-05-29 ENCOUNTER — Telehealth: Payer: Self-pay | Admitting: Internal Medicine

## 2020-05-29 NOTE — Telephone Encounter (Signed)
Pt c/o BP issue: STAT if pt c/o blurred vision, one-sided weakness or slurred speech  1. What are your last 5 BP readings?  Most recent from today   4:15 pm 112/63 HR 90 4:00 pm 61/61 HR 115   2. Are you having any other symptoms (ex. Dizziness, headache, blurred vision, passed out)?  Blurred vision with a yellow spot in it, was dizzy but getting better   3. What is your BP issue?  Feels like he did when Dr. Lovena Le found he has AFIB

## 2020-05-29 NOTE — Telephone Encounter (Signed)
Patient's call sent in straight to triage. Patient complaining of being back in A. FIB and dizziness. Patient stated he took his fast acting metoprolol and he feels like he is now out of A FIB and his dizziness is gone. Right now his BP is 130/69 and HR is 90. Patient stated he feels fine right now. Made patient an appointment with Dr. [name] on Friday to be evaluated and follow up after being in hospital.

## 2020-05-29 NOTE — Progress Notes (Signed)
CARDIOLOGY CONSULT NOTE       Patient ID: [name] [name] MRN: [id number] DOB/AGE: 22-May-1950 70 y.o.  Primary Physician: [name], FNP Primary Cardiologist: [name]   HPI:  70 y.o. with history of ovesity, DM, HLD, HTN, PAF and CAD. Previous DES to RCA in 2010 subsequent caths showing CTO of RCA, distal circumflex, D2 and patent stents more proximally in circumflex/OM.  He is intolerant to Repatha and statins Seen in [facility] ED with [facility] 05/23/20 with troponin 265 Cath on 1/7 by CM resulted in intervention to proximal / OM stents With same collaterals noted as above  ? Plan for 30 days of ASA then plavix and eliquis given his need for anticoagulation for PAF He has known 40-59% right ICA stenosis most recent duplex 01/11/20 He sees Dr Charleston Ropes for PVD with stents to right SFA but unable to recanalize left SFA Fem-pop bypass deferred ABI's 0.72 bilateral duplex April 2021 with some moderate stent re stenosis    EF is 50-55%   He has a LINQ device but ERI Has had PAF Rx with eliquis and Toprol  His PAF has been quite Initially picked up on ILR and followed by Dr Lovena Le who Noted NSR 98% of time   Recent hospitalization was in NSR with PACls no afib noted Called office 05/29/20 with palpitations and rapid HR Took SA inderal with some relief Thought he may have been in PAF  ECG in office today confirms rapid PAF rate 119  Discussed Rx with AAT and proceeding with [facility] in 2-3 weeks if he does not convert He has not missed a dose  Of his eliquis since heart cath done 05/24/20 Discussed stopping his ASA on 06/24/20   ROS All other systems reviewed and negative except as noted above  Past Medical History:  Diagnosis Date   Anxiety    Arthritis    CAD (coronary artery disease)    a. 2010: DES to CTO of RCA. EF 55% b. 07/2016: cath showing total occlusion within previously placed RCA stent (collaterals present), severe stenosis along LCx and OM1 (treated with 2 overlapping DES). c. repeat cath  in 01/2018 showing patent stents along LCx and OM with CTO of D2, CTO of distal LCx, and CTO of RCA with collaterals present overall unchanged since 2018 with medical management recom   Cellulitis and abscess rt groin    Complication of anesthesia    " I woke up during a colonoscopy "      Depression    Diabetes mellitus    Disorders of iron metabolism    GERD (gastroesophageal reflux disease)    Hyperlipidemia    Hypertension    Low serum testosterone level    Medically noncompliant    Myocardial infarction [facility])     Family History  Problem Relation Age of Onset   Diabetes Father    Valvular heart disease Father    Arthritis Father    Heart disease Father    Stroke Father    Alzheimer's disease Mother    Hyperlipidemia Mother    Hypertension Mother    Arthritis Mother    Lung cancer Mother    Stroke Mother    Headache Mother    Arthritis/Rheumatoid Sister    Diabetes Sister    Hypertension Sister    Hyperlipidemia Sister    Depression Sister    Dementia Maternal Aunt    Dementia Maternal Uncle    Heart disease Maternal Uncle    Stomach cancer Paternal  Uncle    Colon cancer Neg Hx    Liver disease Neg Hx     Social History   Socioeconomic History   Marital status: Married    Spouse name: Not on file   Number of children: 2   Years of education: 12   Highest education level: 12th grade  Occupational History   Occupation: retired  Tobacco Use   Smoking status: Former Smoker    Packs/day: 0.25    Years: 51.00    Pack years: 12.75    Types: Cigarettes    Quit date: 08/14/2016    Years since quitting: 3.7   Smokeless tobacco: Never Used   Tobacco comment: smokes  a pack a week  Vaping Use   Vaping Use: Never used  Substance and Sexual Activity   Alcohol use: No    Alcohol/week: 0.0 standard drinks   Drug use: No   Sexual activity: Yes  Other Topics Concern   Not on file  Social History Narrative   Lives  with his wife.  Retired.  Unable to afford expensive medicines.        Caffeine: 1 cup of 1/2 caff coffee in AM, drinks more if at a restaurant    Social Determinants of Health   Financial Resource Strain: Not on file  Food Insecurity: Not on file  Transportation Needs: Not on file  Physical Activity: Not on file  Stress: Not on file  Social Connections: Not on file  Intimate Partner Violence: Not on file    Past Surgical History:  Procedure Laterality Date   ABDOMINAL AORTOGRAM W/LOWER EXTREMITY N/A 03/21/2019   Procedure: ABDOMINAL AORTOGRAM W/LOWER EXTREMITY;  Surgeon: [name], MD;  Location: [place] [facility];  Service: Cardiovascular;  Laterality: N/A;   BACK SURGERY  2015   ACDF by Dr. [name]   COLONOSCOPY N/A 10/01/2014   Dr. [name]: multiple tubular adenomas removed, colonic diverticulosis, redundant colon. next tcs advised for 09/2017. PATIENT NEEDS PROPOFOL FOR FAILED CONSCIOUS SEDATION   CORONARY STENT INTERVENTION N/A 07/30/2016   Procedure: Coronary Stent Intervention;  Surgeon: [name], MD;  Location: [place] [facility];  Service: Cardiovascular;  Laterality: N/A;   CORONARY STENT PLACEMENT  2000   By Dr. Olevia Perches   EP IMPLANTABLE DEVICE N/A 05/25/2016   Procedure: Loop Recorder Insertion;  Surgeon: [name], MD;  Location: [place] [facility];  Service: Cardiovascular;  Laterality: N/A;   ESOPHAGOGASTRODUODENOSCOPY     esophagus stretched remotely at [facility]   ESOPHAGOGASTRODUODENOSCOPY N/A 10/01/2014   Dr. [name]: patchy mottling/erythema and minimal polypoid appearance of gastric mucosa. bx with mild inlammation but no H.pylori   HERNIA REPAIR  8315   umbilical   LEFT HEART CATH AND CORONARY ANGIOGRAPHY N/A 07/30/2016   Procedure: Left Heart Cath and Coronary Angiography;  Surgeon: [name], MD;  Location: [place] [facility];  Service: Cardiovascular;  Laterality: N/A;   LEFT HEART CATH AND CORONARY ANGIOGRAPHY N/A 01/19/2018   Procedure:  LEFT HEART CATH AND CORONARY ANGIOGRAPHY;  Surgeon: [name], MD;  Location: [place] [facility];  Service: Cardiovascular;  Laterality: N/A;   LEFT HEART CATH AND CORONARY ANGIOGRAPHY N/A 05/24/2020   Procedure: LEFT HEART CATH AND CORONARY ANGIOGRAPHY;  Surgeon: Burnell Blanks, MD;  Location: [place] [facility];  Service: Cardiovascular;  Laterality: N/A;   LESION REMOVAL     Lip and hand    LOWER EXTREMITY ANGIOGRAPHY N/A 04/18/2019   Procedure: LOWER EXTREMITY ANGIOGRAPHY;  Surgeon: [name], MD;  Location: [place] [facility];  Service: Cardiovascular;  Laterality: N/A;   NECK SURGERY     PERIPHERAL VASCULAR BALLOON ANGIOPLASTY  04/18/2019   Procedure: PERIPHERAL VASCULAR BALLOON ANGIOPLASTY;  Surgeon: [name], MD;  Location: [place] [facility];  Service: Cardiovascular;;   PERIPHERAL VASCULAR INTERVENTION Right 03/21/2019   Procedure: PERIPHERAL VASCULAR INTERVENTION;  Surgeon: [name], MD;  Location: [place] [facility];  Service: Cardiovascular;  Laterality: Right;  superficial femoral      Current Outpatient Medications:    albuterol (VENTOLIN HFA) 108 (90 Base) MCG/ACT inhaler, Inhale 2 puffs into the lungs every 6 (six) hours as needed for wheezing or shortness of breath., Disp: 8.5 g, Rfl: 3   AMBULATORY NON FORMULARY MEDICATION, Take 180 mg by mouth daily at 12 noon. Medication Name: Clear Study Drug-Bempodic acid versus placebo, Disp: , Rfl:    apixaban (ELIQUIS) 5 MG TABS tablet, Take 1 tablet (5 mg total) by mouth 2 (two) times daily., Disp: 180 tablet, Rfl: 1   aspirin EC 81 MG EC tablet, Take 1 tablet (81 mg total) by mouth daily. Swallow whole., Disp: 30 tablet, Rfl: 0   Chlorphen-Phenyleph-ASA (ALKA-SELTZER PLUS COLD PO), Take 2 tablets by mouth daily as needed (allergies)., Disp: , Rfl:    clopidogrel (PLAVIX) 75 MG tablet, Take 1 tablet by mouth once daily, Disp: 90 tablet, Rfl: 0   dapagliflozin propanediol (FARXIGA) 5 MG  TABS tablet, Take 1 tablet (5 mg total) by mouth daily before breakfast., Disp: 30 tablet, Rfl: 3   diphenhydrAMINE-APAP, sleep, (TYLENOL PM EXTRA STRENGTH PO), Take 1 tablet by mouth at bedtime as needed (pain)., Disp: , Rfl:    DULoxetine (CYMBALTA) 30 MG capsule, Take 1 capsule (30 mg total) by mouth daily., Disp: 90 capsule, Rfl: 4   fenofibrate 160 MG tablet, TAKE 1 TABLET BY MOUTH ONCE DAILY FOR CHOLESTEROL AND TRIGLYCERIDES, Disp: 90 tablet, Rfl: 4   furosemide (LASIX) 20 MG tablet, TAKE 1 TABLET BY MOUTH ONCE DAILY AS NEEDED FOR EDEMA, Disp: 90 tablet, Rfl: 0   gabapentin (NEURONTIN) 300 MG capsule, Take 900 mg by mouth 2 (two) times daily., Disp: , Rfl:    HUMALOG KWIKPEN 100 UNIT/ML KwikPen, INJECT 5 TO 20 UNITS SUBCUTANEOUSLY THREE TIMES DAILY WITH MEALS, Disp: 15 mL, Rfl: 0   HYDROcodone-acetaminophen (NORCO) 10-325 MG tablet, Take 1 tablet by mouth every 6 (six) hours as needed for moderate pain., Disp: , Rfl:    isosorbide mononitrate (IMDUR) 30 MG 24 hr tablet, Take 1 tablet (30 mg total) by mouth daily., Disp: 90 tablet, Rfl: 4   linaclotide (LINZESS) 72 MCG capsule, Take 1 capsule (72 mcg total) by mouth daily as needed (constipation)., Disp: 90 capsule, Rfl: 3   Menthol, Topical Analgesic, (BLUE-EMU MAXIMUM STRENGTH EX), Apply 1 application topically daily as needed (back pain)., Disp: , Rfl:    metoprolol succinate (TOPROL-XL) 50 MG 24 hr tablet, Take 1 tablet by mouth once daily, Disp: 90 tablet, Rfl: 1   metoprolol tartrate (LOPRESSOR) 50 MG tablet, Take 50 mg by mouth daily as needed (a-fib)., Disp: , Rfl:    nitroGLYCERIN (NITROSTAT) 0.4 MG SL tablet, Place 1 tablet (0.4 mg total) under the tongue every 5 (five) minutes x 3 doses as needed for chest pain., Disp: 25 tablet, Rfl: 3   pantoprazole (PROTONIX) 40 MG tablet, Take 1 tablet by mouth once daily, Disp: 90 tablet, Rfl: 1   Semaglutide, 1 MG/DOSE, (OZEMPIC, 1  MG/DOSE,) 2 MG/1.5ML SOPN, Inject 0.75 mLs (1 mg  total) into the skin once a week., Disp: 12 mL, Rfl: 4   telmisartan (MICARDIS) 20 MG tablet, Take 1 tablet (20 mg total) by mouth daily., Disp: 90 tablet, Rfl: 1   TRESIBA FLEXTOUCH 200 UNIT/ML FlexTouch Pen, Inject 80 Units into the skin in the morning and at bedtime., Disp: 18 mL, Rfl: 3   triamcinolone (NASACORT) 55 MCG/ACT AERO nasal inhaler, Place 1 spray into the nose as needed (allergies)., Disp: , Rfl:    vitamin B-12 (CYANOCOBALAMIN) 1000 MCG tablet, Take 1,000 mcg by mouth daily., Disp: , Rfl:     Physical Exam: Blood pressure 138/84, pulse 80, weight (!) 136.3 kg, SpO2 96 %.   Affect appropriate Chronically ill male  HEENT: normal Neck supple with no adenopathy JVP normal no bruits no thyromegaly Lungs clear with no wheezing and good diaphragmatic motion Heart:  S1/S2 no murmur, no rub, gallop or click PMI normal Abdomen: benighn, BS positve, no tenderness, no AAA no bruit.  No HSM or HJR Pulses reduced bilaterally with femoral bruits  No edema Neuro non-focal Skin warm and dry No muscular weakness   Labs:   Lab Results  Component Value Date   WBC 11.1 (H) 05/25/2020   HGB 11.9 (L) 05/25/2020   HCT 37.1 (L) 05/25/2020   MCV 90.5 05/25/2020   PLT 291 05/25/2020    Recent Labs  Lab 05/25/20 0223  NA 136  K 4.1  CL 100  CO2 27  BUN 16  CREATININE 1.48*  CALCIUM 9.7  GLUCOSE 57*   Lab Results  Component Value Date   CKTOTAL 69 10/26/2011   CKMB 2.0 10/26/2011   TROPONINI 0.03 (HH) 01/18/2018    Lab Results  Component Value Date   CHOL 172 05/23/2020   CHOL 232 (H) 02/14/2020   CHOL 242 (H) 10/31/2018   Lab Results  Component Value Date   HDL 21 (L) 05/23/2020   HDL 15 (L) 02/14/2020   HDL 16 (L) 10/31/2018   Lab Results  Component Value Date   LDLCALC 75 05/23/2020   LDLCALC 108 (H) 02/14/2020   LDLCALC Comment 10/31/2018   Lab Results  Component Value Date   TRIG 378 (H) 05/23/2020   TRIG 621 (HH) 02/14/2020   TRIG 999 (HH)  10/31/2018   Lab Results  Component Value Date   CHOLHDL 8.2 05/23/2020   CHOLHDL 15.5 (H) 02/14/2020   CHOLHDL 15.1 (H) 10/31/2018   Lab Results  Component Value Date   LDLDIRECT 172 (H) 11/08/2014      Radiology: CARDIAC CATHETERIZATION  Result Date: 05/24/2020  Prox LAD lesion is 40% stenosed.  Ost 1st Diag lesion is 40% stenosed.  Ost 2nd Diag to 2nd Diag lesion is 100% stenosed.  Mid Cx to Dist Cx lesion is 100% stenosed.  Prox RCA to Mid RCA lesion is 90% stenosed.  Prox RCA lesion is 99% stenosed.  Ost 1st Mrg to 1st Mrg lesion is 80% stenosed.  A drug-eluting stent was successfully placed using a STENT RESOLUTE ONYX [id number].  Post intervention, there is a 0% residual stenosis.  Prox Cx lesion is 95% stenosed.  A drug-eluting stent was successfully placed using a STENT RESOLUTE ONYX [id number].  Post intervention, there is a 0% residual stenosis.  1. Moderate mid LAD stenosis unchanged. Chronic occlusion Diagonal 1. Severe disease small diagonal 2. 2. Severe restenosis Circumflex/OM stents. 3. Successful PTCA/DES x 2 with overlapping Resolute Onyx DES in the Circumflex/OM 4.  Chronic occlusion mid RCA in old stented segment. The distal vessel fills briskly from left to right collaterals. Recommendations: Continue Plavix. Resume anticoagulation tomorrow. Will continue ASA 81 mg daily until he is fully anti-coagulated then OK to stop ASA.   DG Chest Port 1 View  Result Date: 05/23/2020 CLINICAL DATA:  Chest pain since 10:30 p.m. yesterday, history of coronary artery disease EXAM: PORTABLE CHEST 1 VIEW COMPARISON:  01/18/2018 FINDINGS: Single frontal view of the chest demonstrates stable enlarged cardiac silhouette. No airspace disease, effusion, or pneumothorax. No acute bony abnormalities. IMPRESSION: 1. Stable exam, no acute process. Electronically Signed   By: Randa Ngo M.D.   On: 05/23/2020 01:41    EKG: SR PAC lateral ST depression 05/27/20  05/31/2020 afib rate 113  nonspecific ST changes    ASSESSMENT AND PLAN:   1. PAF:  Has been quiescent ? Recent SEMI triggers activity On beta blocker and eliquis  Start amiodarone 200 bid Given good  BP and rapid rate don't think we need to lower beta blocker. He already has f/u with [facility] in [place] next week and [place] [facility] in 3 weeks. Dr Lovena Le can discuss when to taper amiodarone to 200 mg daily and arrange [facility] with [place] depending on how he tolerates AAT and if he converts. Would be nice if [facility] arranged with me  2. CAD: recent SEMI. Re-intervention to proximal circumflex stent/OM Known CTO's distal circumflex, D2 and RCA Continue medical Rx ASA till 06/24/20 then plavix and eliquis 3. PVD:  F/u [name] for duplex moderate claudication post stent to R SFA and 40-59% RICA stenosis  4. HLD:  Research study intolerant to statins and Repatha 5. DM: Discussed low carb diet.  Target hemoglobin A1c is 6.5 or less.  Continue current medications. 6. HTN: Well controlled.  Continue current medications and low sodium Dash type diet.    Amiodarone 200 bid  F/U with Dr Lovena Le next week already scheduled  F/U with me post [facility]   Signed: Jenkins Rouge 05/31/2020, 2:40 PM

## 2020-05-31 ENCOUNTER — Ambulatory Visit: Payer: Medicare Other | Admitting: Cardiovascular Disease

## 2020-05-31 ENCOUNTER — Encounter: Payer: Self-pay | Admitting: Cardiovascular Disease

## 2020-05-31 ENCOUNTER — Other Ambulatory Visit: Payer: Self-pay

## 2020-05-31 ENCOUNTER — Encounter: Payer: Self-pay | Admitting: Family

## 2020-05-31 ENCOUNTER — Ambulatory Visit (INDEPENDENT_AMBULATORY_CARE_PROVIDER_SITE_OTHER): Payer: Medicare Other | Admitting: Family

## 2020-05-31 VITALS — BP 142/73 | HR 81 | Temp 96.0°F | Ht 74.0 in | Wt 297.6 lb

## 2020-05-31 VITALS — BP 138/84 | HR 80 | Wt 300.4 lb

## 2020-05-31 DIAGNOSIS — I214 Non-ST elevation (NSTEMI) myocardial infarction: Secondary | ICD-10-CM | POA: Diagnosis not present

## 2020-05-31 DIAGNOSIS — I480 Paroxysmal atrial fibrillation: Secondary | ICD-10-CM | POA: Diagnosis not present

## 2020-05-31 DIAGNOSIS — Z09 Encounter for follow-up examination after completed treatment for conditions other than malignant neoplasm: Secondary | ICD-10-CM

## 2020-05-31 DIAGNOSIS — I6502 Occlusion and stenosis of left vertebral artery: Secondary | ICD-10-CM | POA: Diagnosis not present

## 2020-05-31 DIAGNOSIS — E1159 Type 2 diabetes mellitus with other circulatory complications: Secondary | ICD-10-CM

## 2020-05-31 DIAGNOSIS — I152 Hypertension secondary to endocrine disorders: Secondary | ICD-10-CM | POA: Diagnosis not present

## 2020-05-31 MED ORDER — AMIODARONE HCL 200 MG PO TABS: 200.0000 mg | ORAL_TABLET | Freq: Two times a day (BID) | ORAL | 1 refills | Status: DC

## 2020-05-31 NOTE — Progress Notes (Addendum)
Subjective:    Patient ID: [name] [name], male    DOB: Sep 30, 1950, 70 y.o.   MRN: [id number]  Chief Complaint  Patient presents with  . Transitions Of Care    HPI Today's visit was for Transitional Care Management.  The patient was discharged from [facility] on 05/25/20 with a primary diagnosis of NSTEMI.   Contact with the patient and/or caregiver, by a clinical staff member, was made on 05/27/20 and was documented as a telephone encounter within the EMR.  Through chart review and discussion with the patient I have determined that management of their condition is of moderate complexity.   He presented to the ED with chest pain. He had a  Catheterization was 05/24/20.  "His repeat catheterization showed moderate mid-LAD stenosis which was similar to his prior cath and he had a known CTO of D1 and CTO of RCA and distal LCx. Cath did show severe restenosis of the proximal LCx and OM1 which was treated with 2 overlapping DES."   He denies any chest pain, SOB, palpitations,  edema at this time. He reports he had one episode of A Fib since discharge that lasted about a hour. He has a follow up with Cardiologists this afternoon.   His BP is elevated today, but states he has not taken his medications this morning.   Review of Systems  All other systems reviewed and are negative.      Objective:   Physical Exam Vitals reviewed.  Constitutional:      General: He is not in acute distress.    Appearance: He is well-developed and well-nourished. He is obese.  HENT:     Head: Normocephalic.     Right Ear: Tympanic membrane normal.     Left Ear: Tympanic membrane normal.     Mouth/Throat:     Mouth: Oropharynx is clear and moist.  Eyes:     General:        Right eye: No discharge.        Left eye: No discharge.     Pupils: Pupils are equal, round, and reactive to light.  Neck:     Thyroid: No thyromegaly.  Cardiovascular:     Rate and Rhythm: Normal rate and regular rhythm.      Pulses: Intact distal pulses.     Heart sounds: Murmur heard.    Pulmonary:     Effort: Pulmonary effort is normal. No respiratory distress.     Breath sounds: Normal breath sounds. No wheezing.  Abdominal:     General: Bowel sounds are normal. There is no distension.     Palpations: Abdomen is soft.     Tenderness: There is no abdominal tenderness.  Musculoskeletal:        General: No tenderness or edema. Normal range of motion.     Cervical back: Normal range of motion and neck supple.  Skin:    General: Skin is warm and dry.     Findings: No erythema or rash.  Neurological:     Mental Status: He is alert and oriented to person, place, and time.     Cranial Nerves: No cranial nerve deficit.     Deep Tendon Reflexes: Reflexes are normal and symmetric.  Psychiatric:        Mood and Affect: Mood and affect normal.        Behavior: Behavior normal.        Thought Content: Thought content normal.  Judgment: Judgment normal.       BP (!) 153/74   Pulse 81   Temp (!) 96 F (35.6 C) (Temporal)   Ht $R'6\' 2"'iW$  (1.88 m)   Wt 297 lb 9.6 oz (135 kg)   SpO2 98%   BMI 38.21 kg/m      Assessment & Plan:  [name] [name] comes in today with chief complaint of Transitions Of Care   Diagnosis and orders addressed:  1. Hospital discharge follow-up - CMP14+EGFR - CBC with Differential/Platelet  2. Non-ST elevation (NSTEMI) myocardial infarction (HCC) - CMP14+EGFR - CBC with Differential/Platelet  3. Hypertension associated with diabetes ([place]) - CMP14+EGFR - CBC with Differential/Platelet  4. Occlusion and stenosis of left vertebral artery - CMP14+EGFR - CBC with Differential/Platelet  5. PAF (paroxysmal atrial fibrillation) ([place]) - CMP14+EGFR - CBC with Differential/Platelet   Labs pending Health Maintenance reviewed Diet and exercise encouraged  Follow up plan: 3 months    [name], FNP

## 2020-05-31 NOTE — Patient Instructions (Signed)
Medication Instructions:  Your physician has recommended you make the following change in your medication:  -- START AMIODARONE 200 mg - Take 1 tablet (200 mg) by mouth twice daily -- RX SENT *If you need a refill on your cardiac medications before your next appointment, please call your pharmacy*  Lab Work: Your physician recommends that you have lab work in [employer] when you see Dr. Lovena Le -- CBC, BMET, and TSH If you have labs (blood work) drawn today and your tests are completely normal, you will receive your results only by: Marland Kitchen MyChart Message (if you have MyChart) OR . A paper copy in the mail If you have any lab test that is abnormal or we need to change your treatment, we will call you to review the results.  Follow-Up: At [facility], you and your health needs are our priority.  As part of our continuing mission to provide you with exceptional heart care, we have created designated Provider Care Teams.  These Care Teams include your primary Cardiologist (physician) and Advanced Practice Providers (APPs -  Physician Assistants and Nurse Practitioners) who all work together to provide you with the care you need, when you need it.  We recommend signing up for the patient portal called "MyChart".  Sign up information is provided on this After Visit Summary.  MyChart is used to connect with patients for Virtual Visits (Telemedicine).  Patients are able to view lab/test results, encounter notes, upcoming appointments, etc.  Non-urgent messages can be sent to your provider as well.   To learn more about what you can do with MyChart, go to [email or web address].    Your next appointment:   Your physician recommends that you keep your scheduled follow-up appointment on: 06/05/20 with Dr. Lovena Le  Your physician recommends that you schedule a follow-up appointment in: 3-4 WEEKS with Dr. [name] or an APP  The format for your next appointment:   In Person with You may see Jenkins Rouge, MD  or one of the following Advanced Practice Providers on your designated Care Team:    Cecilie Kicks, NP  Kathyrn Drown, NP

## 2020-05-31 NOTE — Patient Instructions (Signed)
Heart Attack The heart is a muscle that needs oxygen to survive. A heart attack is a condition that occurs when your heart does not get enough oxygen. When this happens, the heart muscle begins to die. This can cause permanent damage if not treated right away. A heart attack is a medical emergency. This condition may be called a myocardial infarction, or MI. It is also known as acute coronary syndrome (ACS). ACS is a term used to describe a group of conditions that affect blood flow to the heart. What are the causes? This condition may be caused by:  Atherosclerosis. This occurs when a fatty substance called plaque builds up in the arteries and blocks or reduces blood supply to the heart.  A blood clot. A blood clot can develop suddenly when plaque breaks up within an artery and blocks blood flow to the heart.  Low blood pressure.  An abnormal heartbeat (arrhythmia).  Conditions that cause a decrease of oxygen to the heart, such as anemiaorrespiratory failure.  A spasm, or severe tightening, of a blood vessel that cuts off blood flow to the heart.  Tearing of a coronary artery (spontaneous coronary artery dissection).  High blood pressure.   What increases the risk? The following factors may make you more likely to develop this condition:  Aging. The older you are, the higher your risk.  Having a personal or family history of chest pain, heart attack, stroke, or narrowing of the arteries in the legs, arms, head, or stomach (peripheral artery disease).  Being male.  Smoking.  Not getting regular exercise.  Being overweight or obese.  Having high blood pressure.  Having high cholesterol (hypercholesterolemia).  Having diabetes.  Drinking too much alcohol.  Using illegal drugs, such as cocaine or methamphetamine. What are the signs or symptoms? Symptoms of this condition may vary, depending on factors like gender and age. Symptoms may include:  Chest pain. It may feel  like: ? Crushing or squeezing. ? Tightness, pressure, fullness, or heaviness.  Pain in the arm, neck, jaw, back, or upper body.  Shortness of breath.  Heartburn or upset stomach.  Nausea.  Sudden cold sweats.  Feeling tired.  Sudden light-headedness. How is this diagnosed? This condition may be diagnosed through tests, such as:  Electrocardiogram (ECG) to measure the electrical activity of your heart.  Blood tests to check for cardiac markers. These chemicals are released by a damaged heart muscle.  A test to evaluate blood flow and heart function (coronary angiogram).  CT scan to see the heart more clearly.  A test to evaluate the pumping action of the heart (echocardiogram). How is this treated? A heart attack must be treated as soon as possible. Treatment may include:  Medicines to: ? Break up or dissolve blood clots (fibrinolytic therapy). ? Thin blood and help prevent blood clots. ? Treat blood pressure. ? Improve blood flow to the heart. ? Reduce pain. ? Reduce cholesterol.  Angioplasty and stent placement. These are procedures to widen a blocked artery and keep it open.  Coronary artery bypass graft, CABG, or open heart surgery. This enables blood to flow to the heart by going around the blocked part of the artery.  Oxygen therapy if needed.  Cardiac rehabilitation. This improves your health and well-being through exercise, education, and counseling.   Follow these instructions at home: Medicines  Take over-the-counter and prescription medicines only as told by your health care provider.  Do not take the following medicines unless your health care provider  says it is okay to take them: ? NSAIDs, such as ibuprofen. ? Supplements that contain vitamin A, vitamin E, or both. ? Hormone replacement therapy that contains estrogen with or without progestin. Lifestyle  Do not use any products that contain nicotine or tobacco, such as cigarettes, e-cigarettes,  and chewing tobacco. If you need help quitting, ask your health care provider.  Avoid secondhand smoke.  Exercise regularly. Ask your health care provider about participating in a cardiac rehabilitation program that helps you start exercising safely after a heart attack.  Eat a heart-healthy diet. Your health care provider will tell you what foods to eat.  Maintain a healthy weight.  Learn ways to manage stress.  Do not use illegal drugs.   Alcohol use  Do not drink alcohol if: ? Your health care provider tells you not to drink. ? You are pregnant, may be pregnant, or are planning to become pregnant.  If you drink alcohol: ? Limit how much you use to:  0-1 drink a day for women.  0-2 drinks a day for men. ? Be aware of how much alcohol is in your drink. In the [place] one drink equals one 12 oz bottle of beer (355 mL), one 5 oz glass of wine (148 mL), or one 1 oz glass of hard liquor (44 mL). General instructions  Work with your health care provider to manage any other conditions you have, such as high blood pressure or diabetes. These conditions affect your heart.  Get screened for depression, and seek treatment if needed.  Keep your vaccinations up to date. Get the flu vaccine every year.  Keep all follow-up visits as told by your health care provider. This is important. Contact a health care provider if:  You feel overwhelmed or sad.  You have trouble doing your daily activities. Get help right away if:  You have sudden, unexplained discomfort in your chest, arms, back, neck, jaw, or upper body.  You have shortness of breath.  You suddenly start to sweat or your skin gets clammy.  You feel nauseous or you vomit.  You have unexplained tiredness or weakness.  You suddenly feel light-headed or dizzy.  You notice your heart starts to beat fast or feels like it is skipping beats.  You have blood pressure that is higher than 180/120. These symptoms may represent a  serious problem that is an emergency. Do not wait to see if the symptoms will go away. Get medical help right away. Call your local emergency services (911 in the [place] Do not drive yourself to the hospital. Summary  A heart attack, also called myocardial infarction, is a condition that occurs when your heart does not get enough oxygen. This is caused by anything that blocks or reduces blood flow to the heart.  Treatment is a combination of medicines and surgeries, if needed, to open the blocked arteries and restore blood flow to the heart.  A heart attack is an emergency. Get help right away if you have sudden discomfort in your chest, arms, back, neck, jaw, or upper body. Seek help if you feel nauseous, you vomit, or you feel light-headed or dizzy. This information is not intended to replace advice given to you by your health care provider. Make sure you discuss any questions you have with your health care provider. Document Revised: 08/11/2018 Document Reviewed: 08/15/2018 Elsevier Patient Education  [place].

## 2020-06-01 LAB — CMP14+EGFR
ALT: 16 IU/L (ref 0–44)
AST: 20 IU/L (ref 0–40)
Albumin/Globulin Ratio: 1.6 (ref 1.2–2.2)
Albumin: 4.5 g/dL (ref 3.8–4.8)
Alkaline Phosphatase: 59 IU/L (ref 44–121)
BUN/Creatinine Ratio: 15 (ref 10–24)
BUN: 20 mg/dL (ref 8–27)
Bilirubin Total: 0.5 mg/dL (ref 0.0–1.2)
CO2: 26 mmol/L (ref 20–29)
Calcium: 9.9 mg/dL (ref 8.6–10.2)
Chloride: 99 mmol/L (ref 96–106)
Creatinine, Ser: 1.35 mg/dL — ABNORMAL HIGH (ref 0.76–1.27)
GFR calc Af Amer: 61 mL/min/{1.73_m2} (ref >59)
GFR calc non Af Amer: 53 mL/min/{1.73_m2} — ABNORMAL LOW (ref >59)
Globulin, Total: 2.8 g/dL (ref 1.5–4.5)
Glucose: 85 mg/dL (ref 65–99)
Potassium: 4.7 mmol/L (ref 3.5–5.2)
Sodium: 141 mmol/L (ref 134–144)
Total Protein: 7.3 g/dL (ref 6.0–8.5)

## 2020-06-01 LAB — CBC WITH DIFFERENTIAL/PLATELET
Basophils Absolute: 0.1 10*3/uL (ref 0.0–0.2)
Basos: 1 %
EOS (ABSOLUTE): 0.2 10*3/uL (ref 0.0–0.4)
Eos: 2 %
Hematocrit: 41.3 % (ref 37.5–51.0)
Hemoglobin: 13.5 g/dL (ref 13.0–17.7)
Immature Grans (Abs): 0.1 10*3/uL (ref 0.0–0.1)
Immature Granulocytes: 1 %
Lymphocytes Absolute: 1.8 10*3/uL (ref 0.7–3.1)
Lymphs: 21 %
MCH: 28.4 pg (ref 26.6–33.0)
MCHC: 32.7 g/dL (ref 31.5–35.7)
MCV: 87 fL (ref 79–97)
Monocytes Absolute: 0.7 10*3/uL (ref 0.1–0.9)
Monocytes: 8 %
Neutrophils Absolute: 5.8 10*3/uL (ref 1.4–7.0)
Neutrophils: 67 %
Platelets: 356 10*3/uL (ref 150–450)
RBC: 4.75 x10E6/uL (ref 4.14–5.80)
RDW: 12.8 % (ref 11.6–15.4)
WBC: 8.7 10*3/uL (ref 3.4–10.8)

## 2020-06-05 ENCOUNTER — Other Ambulatory Visit: Payer: Self-pay

## 2020-06-05 ENCOUNTER — Encounter: Payer: Self-pay | Admitting: Internal Medicine

## 2020-06-05 ENCOUNTER — Ambulatory Visit: Payer: Medicare Other | Admitting: Internal Medicine

## 2020-06-05 VITALS — BP 144/88 | HR 68 | Ht 74.0 in | Wt 297.0 lb

## 2020-06-05 DIAGNOSIS — I480 Paroxysmal atrial fibrillation: Secondary | ICD-10-CM

## 2020-06-05 DIAGNOSIS — I2510 Atherosclerotic heart disease of native coronary artery without angina pectoris: Secondary | ICD-10-CM | POA: Diagnosis not present

## 2020-06-05 NOTE — Patient Instructions (Signed)
Medication Instructions:  Your physician recommends that you continue on your current medications as directed. Please refer to the Current Medication list given to you today.  On February 7 stop taking Aspirin Decrease  Amiodarone to 200 mg Daily   *If you need a refill on your cardiac medications before your next appointment, please call your pharmacy*   Lab Work: NONE   If you have labs (blood work) drawn today and your tests are completely normal, you will receive your results only by: Marland Kitchen MyChart Message (if you have MyChart) OR . A paper copy in the mail If you have any lab test that is abnormal or we need to change your treatment, we will call you to review the results.   Testing/Procedures: NONE    Follow-Up: At [facility], you and your health needs are our priority.  As part of our continuing mission to provide you with exceptional heart care, we have created designated Provider Care Teams.  These Care Teams include your primary Cardiologist (physician) and Advanced Practice Providers (APPs -  Physician Assistants and Nurse Practitioners) who all work together to provide you with the care you need, when you need it.  We recommend signing up for the patient portal called "MyChart".  Sign up information is provided on this After Visit Summary.  MyChart is used to connect with patients for Virtual Visits (Telemedicine).  Patients are able to view lab/test results, encounter notes, upcoming appointments, etc.  Non-urgent messages can be sent to your provider as well.   To learn more about what you can do with MyChart, go to [email or web address].    Your next appointment:   6 month(s)  The format for your next appointment:   In Person  Provider:   Cristopher Peru, MD   Other Instructions Thank you for choosing [facility]!

## 2020-06-05 NOTE — Progress Notes (Signed)
HPI Mr. Travaglini returns today for ongoing evaluation and management of PAF, CAD, s/p MI, and cryptogenic stroke, s/p ILR for which he was found to have PAF. In the interim, he has had an NSTEMI and undergone PCI of the Left CX. He was placed on amiodarone and his palpitations have resolved.  No syncope.  Allergies  Allergen Reactions  . Shellfish Allergy Anaphylaxis and Other (See Comments)    Tongue swelling, hives   . Sulfa Antibiotics Anaphylaxis and Rash    Tongue swelling, hives  . Ace Inhibitors Other (See Comments) and Cough    CKD, renal failure   . Invokana [Canagliflozin] Other (See Comments)    Syncope / dehydration  . Lisinopril Cough  . Metformin And Related Itching  . Pravastatin Sodium Other (See Comments)    myalgias  . Milk-Related Compounds     Ties stomach in knots   . Crestor [Rosuvastatin] Other (See Comments)    Myalgias   . Fenofibrate Other (See Comments)    Body aches - pt currently taking isnt sure if its causing any pain  . Horse-Derived Products Rash  . Iodine Other (See Comments)    ????. PATIENT SAYS HE DID FINE WITH LAST CT WITH CONTRAST** patient had IV contrast on 06/21/2018 without pre meds without any issues. Patient had allergic reaction to Shellfish 30 years ago-not to IV contrast.   . Lexapro [Escitalopram Oxalate] Other (See Comments)    Buzzing in ears,headache, felt like a zombie  . Lipitor [Atorvastatin] Other (See Comments)    myalgias  . Livalo [Pitavastatin] Other (See Comments)    Myalgias   . Repatha [Evolocumab] Other (See Comments)    Myalgias, flu like sx  . Tape Rash     Current Outpatient Medications  Medication Sig Dispense Refill  . albuterol (VENTOLIN HFA) 108 (90 Base) MCG/ACT inhaler Inhale 2 puffs into the lungs every 6 (six) hours as needed for wheezing or shortness of breath. 8.5 g 3  . AMBULATORY NON FORMULARY MEDICATION Take 180 mg by mouth daily at 12 noon. Medication Name: Clear Study Drug-Bempodic acid  versus placebo    . amiodarone (PACERONE) 200 MG tablet Take 1 tablet (200 mg total) by mouth in the morning and at bedtime. 60 tablet 1  . apixaban (ELIQUIS) 5 MG TABS tablet Take 1 tablet (5 mg total) by mouth 2 (two) times daily. 180 tablet 1  . aspirin EC 81 MG EC tablet Take 1 tablet (81 mg total) by mouth daily. Swallow whole. 30 tablet 0  . Chlorphen-Phenyleph-ASA (ALKA-SELTZER PLUS COLD PO) Take 2 tablets by mouth daily as needed (allergies).    . clopidogrel (PLAVIX) 75 MG tablet Take 1 tablet by mouth once daily 90 tablet 0  . dapagliflozin propanediol (FARXIGA) 5 MG TABS tablet Take 1 tablet (5 mg total) by mouth daily before breakfast. 30 tablet 3  . diphenhydrAMINE-APAP, sleep, (TYLENOL PM EXTRA STRENGTH PO) Take 1 tablet by mouth at bedtime as needed (pain).    . DULoxetine (CYMBALTA) 30 MG capsule Take 1 capsule (30 mg total) by mouth daily. 90 capsule 4  . fenofibrate 160 MG tablet TAKE 1 TABLET BY MOUTH ONCE DAILY FOR CHOLESTEROL AND TRIGLYCERIDES 90 tablet 4  . furosemide (LASIX) 20 MG tablet TAKE 1 TABLET BY MOUTH ONCE DAILY AS NEEDED FOR EDEMA 90 tablet 0  . gabapentin (NEURONTIN) 300 MG capsule Take 900 mg by mouth 2 (two) times daily.    Marland Kitchen HUMALOG KWIKPEN 100  UNIT/ML KwikPen INJECT 5 TO 20 UNITS SUBCUTANEOUSLY THREE TIMES DAILY WITH MEALS 15 mL 0  . HYDROcodone-acetaminophen (NORCO) 10-325 MG tablet Take 1 tablet by mouth every 6 (six) hours as needed for moderate pain.    . isosorbide mononitrate (IMDUR) 30 MG 24 hr tablet Take 1 tablet (30 mg total) by mouth daily. 90 tablet 4  . linaclotide (LINZESS) 72 MCG capsule Take 1 capsule (72 mcg total) by mouth daily as needed (constipation). 90 capsule 3  . Menthol, Topical Analgesic, (BLUE-EMU MAXIMUM STRENGTH EX) Apply 1 application topically daily as needed (back pain).    . metoprolol succinate (TOPROL-XL) 50 MG 24 hr tablet Take 1 tablet by mouth once daily 90 tablet 1  . metoprolol tartrate (LOPRESSOR) 50 MG tablet Take 50  mg by mouth daily as needed (a-fib).    . nitroGLYCERIN (NITROSTAT) 0.4 MG SL tablet Place 1 tablet (0.4 mg total) under the tongue every 5 (five) minutes x 3 doses as needed for chest pain. 25 tablet 3  . pantoprazole (PROTONIX) 40 MG tablet Take 1 tablet by mouth once daily 90 tablet 1  . Semaglutide, 1 MG/DOSE, (OZEMPIC, 1 MG/DOSE,) 2 MG/1.5ML SOPN Inject 0.75 mLs (1 mg total) into the skin once a week. 12 mL 4  . telmisartan (MICARDIS) 20 MG tablet Take 1 tablet (20 mg total) by mouth daily. 90 tablet 1  . TRESIBA FLEXTOUCH 200 UNIT/ML FlexTouch Pen Inject 80 Units into the skin in the morning and at bedtime. 18 mL 3  . triamcinolone (NASACORT) 55 MCG/ACT AERO nasal inhaler Place 1 spray into the nose as needed (allergies).    . vitamin B-12 (CYANOCOBALAMIN) 1000 MCG tablet Take 1,000 mcg by mouth daily.     No current facility-administered medications for this visit.     Past Medical History:  Diagnosis Date  . Anxiety   . Arthritis   . CAD (coronary artery disease)    a. 2010: DES to CTO of RCA. EF 55% b. 07/2016: cath showing total occlusion within previously placed RCA stent (collaterals present), severe stenosis along LCx and OM1 (treated with 2 overlapping DES). c. repeat cath in 01/2018 showing patent stents along LCx and OM with CTO of D2, CTO of distal LCx, and CTO of RCA with collaterals present overall unchanged since 2018 with medical management recom  . Cellulitis and abscess rt groin   . Complication of anesthesia    " I woke up during a colonoscopy "     . Depression   . Diabetes mellitus   . Disorders of iron metabolism   . GERD (gastroesophageal reflux disease)   . Hyperlipidemia   . Hypertension   . Low serum testosterone level   . Medically noncompliant   . Myocardial infarction ([place])     ROS:   All systems reviewed and negative except as noted in the HPI.   Past Surgical History:  Procedure Laterality Date  . ABDOMINAL AORTOGRAM W/LOWER EXTREMITY N/A  03/21/2019   Procedure: ABDOMINAL AORTOGRAM W/LOWER EXTREMITY;  Surgeon: [name], MD;  Location: [facility];  Service: Cardiovascular;  Laterality: N/A;  . BACK SURGERY  2015   ACDF by Dr. [name]  . COLONOSCOPY N/A 10/01/2014   Dr. [name]: multiple tubular adenomas removed, colonic diverticulosis, redundant colon. next tcs advised for 09/2017. PATIENT NEEDS PROPOFOL FOR FAILED CONSCIOUS SEDATION  . CORONARY STENT INTERVENTION N/A 07/30/2016   Procedure: Coronary Stent Intervention;  Surgeon: [name], MD;  Location: [facility];  Service: Cardiovascular;  Laterality: N/A;  . CORONARY STENT PLACEMENT  2000   By Dr. Olevia Perches  . EP IMPLANTABLE DEVICE N/A 05/25/2016   Procedure: Loop Recorder Insertion;  Surgeon: [name], MD;  Location: [facility];  Service: Cardiovascular;  Laterality: N/A;  . ESOPHAGOGASTRODUODENOSCOPY     esophagus stretched remotely at [facility]  . ESOPHAGOGASTRODUODENOSCOPY N/A 10/01/2014   Dr. [name]: patchy mottling/erythema and minimal polypoid appearance of gastric mucosa. bx with mild inlammation but no H.pylori  . HERNIA REPAIR  [id number]   umbilical  . LEFT HEART CATH AND CORONARY ANGIOGRAPHY N/A 07/30/2016   Procedure: Left Heart Cath and Coronary Angiography;  Surgeon: [name], MD;  Location: [place] [facility];  Service: Cardiovascular;  Laterality: N/A;  . LEFT HEART CATH AND CORONARY ANGIOGRAPHY N/A 01/19/2018   Procedure: LEFT HEART CATH AND CORONARY ANGIOGRAPHY;  Surgeon: [name], MD;  Location: [place];  Service: Cardiovascular;  Laterality: N/A;  . LEFT HEART CATH AND CORONARY ANGIOGRAPHY N/A 05/24/2020   Procedure: LEFT HEART CATH AND CORONARY ANGIOGRAPHY;  Surgeon: Burnell Blanks, MD;  Location: [place];  Service: Cardiovascular;  Laterality: N/A;  . LESION REMOVAL     Lip and hand   . LOWER EXTREMITY ANGIOGRAPHY N/A 04/18/2019   Procedure: LOWER EXTREMITY ANGIOGRAPHY;  Surgeon: [name], MD;  Location: [facility];  Service: Cardiovascular;  Laterality: N/A;  . NECK SURGERY    . PERIPHERAL VASCULAR BALLOON ANGIOPLASTY  04/18/2019   Procedure: PERIPHERAL VASCULAR BALLOON ANGIOPLASTY;  Surgeon: [name], MD;  Location: [place];  Service: Cardiovascular;;  . PERIPHERAL VASCULAR INTERVENTION Right 03/21/2019   Procedure: PERIPHERAL VASCULAR INTERVENTION;  Surgeon: [name], MD;  Location: [facility];  Service: Cardiovascular;  Laterality: Right;  superficial femoral     Family History  Problem Relation Age of Onset  . Diabetes Father   . Valvular heart disease Father   . Arthritis Father   . Heart disease Father   . Stroke Father   . Alzheimer's disease Mother   . Hyperlipidemia Mother   . Hypertension Mother   . Arthritis Mother   . Lung cancer Mother   . Stroke Mother   . Headache Mother   . Arthritis/Rheumatoid Sister   . Diabetes Sister   . Hypertension Sister   . Hyperlipidemia Sister   . Depression Sister   . Dementia Maternal Aunt   . Dementia Maternal Uncle   . Heart disease Maternal Uncle   . Stomach cancer Paternal Uncle   . Colon cancer Neg Hx   . Liver disease Neg Hx      Social History   Socioeconomic History  . Marital status: Married    Spouse name: Not on file  . Number of children: 2  . Years of education: 9  . Highest education level: 12th grade  Occupational History  . Occupation: retired  Tobacco Use  . Smoking status: Former Smoker    Packs/day: 0.25    Years: 51.00    Pack years: 12.75    Types: Cigarettes    Quit date: 08/14/2016    Years since quitting: 3.8  . Smokeless tobacco: Never Used  . Tobacco comment: smokes  a pack a week  Vaping Use  . Vaping Use: Never used  Substance and Sexual Activity  . Alcohol use: No    Alcohol/week: 0.0 standard drinks  . Drug use:  No  . Sexual activity: Yes  Other Topics Concern  . Not on file  Social History Narrative   Lives  with his wife.  Retired.  Unable to afford expensive medicines.        Caffeine: 1 cup of 1/2 caff coffee in AM, drinks more if at a restaurant    Social Determinants of Health   Financial Resource Strain: Not on file  Food Insecurity: Not on file  Transportation Needs: Not on file  Physical Activity: Not on file  Stress: Not on file  Social Connections: Not on file  Intimate Partner Violence: Not on file     Ht 6\' 2"  (1.88 m)   Wt 297 lb (134.7 kg)   BMI 38.13 kg/m   Physical Exam:  Morbidly obese but Well appearing NAD HEENT: Unremarkable Neck:  6 cm JVD, no thyromegally Lymphatics:  No adenopathy Back:  No CVA tenderness Lungs:  Clear with no wheezes HEART:  Regular rate rhythm, no murmurs, no rubs, no clicks Abd:  soft, positive bowel sounds, no organomegally, no rebound, no guarding Ext:  2 plus pulses, no edema, no cyanosis, no clubbing Skin:  No rashes no nodules Neuro:  CN II through XII intact, motor grossly intact  Assess/Plan: 1. NSTEMI - he is s/p PCI/stenting and doing well. 2. Persistent atrial fib - he appears to be maintaining NSR on amiodarone. He will reduce to 200 mg daily in 3 weeks. 3. Obesity - I discussed the importance of weight loss. 4. HTN -his sbp is up a bit but better at home. A low sodium diet and weight loss are recommended.  Carleene Overlie Ottis Vacha,MD

## 2020-06-06 ENCOUNTER — Emergency Department (HOSPITAL_COMMUNITY): Payer: Medicare Other

## 2020-06-06 ENCOUNTER — Emergency Department (HOSPITAL_COMMUNITY)
Admission: EM | Admit: 2020-06-06 | Discharge: 2020-06-06 | Disposition: A | Discharge status: Home / Self Care | Payer: Medicare Other | Attending: Emergency Medicine | Admitting: Emergency Medicine

## 2020-06-06 DIAGNOSIS — Z955 Presence of coronary angioplasty implant and graft: Secondary | ICD-10-CM | POA: Insufficient documentation

## 2020-06-06 DIAGNOSIS — Z7901 Long term (current) use of anticoagulants: Secondary | ICD-10-CM | POA: Diagnosis not present

## 2020-06-06 DIAGNOSIS — I110 Hypertensive heart disease with heart failure: Secondary | ICD-10-CM | POA: Diagnosis not present

## 2020-06-06 DIAGNOSIS — E1140 Type 2 diabetes mellitus with diabetic neuropathy, unspecified: Secondary | ICD-10-CM | POA: Diagnosis not present

## 2020-06-06 DIAGNOSIS — I2510 Atherosclerotic heart disease of native coronary artery without angina pectoris: Secondary | ICD-10-CM | POA: Insufficient documentation

## 2020-06-06 DIAGNOSIS — R0689 Other abnormalities of breathing: Secondary | ICD-10-CM | POA: Diagnosis not present

## 2020-06-06 DIAGNOSIS — I499 Cardiac arrhythmia, unspecified: Secondary | ICD-10-CM | POA: Diagnosis not present

## 2020-06-06 DIAGNOSIS — I509 Heart failure, unspecified: Secondary | ICD-10-CM | POA: Diagnosis not present

## 2020-06-06 DIAGNOSIS — Z87891 Personal history of nicotine dependence: Secondary | ICD-10-CM | POA: Diagnosis not present

## 2020-06-06 DIAGNOSIS — Z79899 Other long term (current) drug therapy: Secondary | ICD-10-CM | POA: Diagnosis not present

## 2020-06-06 DIAGNOSIS — Z794 Long term (current) use of insulin: Secondary | ICD-10-CM | POA: Insufficient documentation

## 2020-06-06 DIAGNOSIS — Z7902 Long term (current) use of antithrombotics/antiplatelets: Secondary | ICD-10-CM | POA: Insufficient documentation

## 2020-06-06 DIAGNOSIS — I4891 Unspecified atrial fibrillation: Secondary | ICD-10-CM | POA: Insufficient documentation

## 2020-06-06 DIAGNOSIS — R0602 Shortness of breath: Secondary | ICD-10-CM | POA: Insufficient documentation

## 2020-06-06 DIAGNOSIS — Z7984 Long term (current) use of oral hypoglycemic drugs: Secondary | ICD-10-CM | POA: Diagnosis not present

## 2020-06-06 DIAGNOSIS — Z743 Need for continuous supervision: Secondary | ICD-10-CM | POA: Diagnosis not present

## 2020-06-06 DIAGNOSIS — R6889 Other general symptoms and signs: Secondary | ICD-10-CM | POA: Diagnosis not present

## 2020-06-06 DIAGNOSIS — Z7982 Long term (current) use of aspirin: Secondary | ICD-10-CM | POA: Insufficient documentation

## 2020-06-06 LAB — COMPREHENSIVE METABOLIC PANEL
ALT: 15 U/L (ref 0–44)
AST: 21 U/L (ref 15–41)
Albumin: 2.7 g/dL — ABNORMAL LOW (ref 3.5–5.0)
Alkaline Phosphatase: 34 U/L — ABNORMAL LOW (ref 38–126)
Anion gap: 10 (ref 5–15)
BUN: 15 mg/dL (ref 8–23)
CO2: 21 mmol/L — ABNORMAL LOW (ref 22–32)
Calcium: 7.3 mg/dL — ABNORMAL LOW (ref 8.9–10.3)
Chloride: 110 mmol/L (ref 98–111)
Creatinine, Ser: 1.12 mg/dL (ref 0.61–1.24)
GFR, Estimated: >60 mL/min (ref >60)
Glucose, Bld: 125 mg/dL — ABNORMAL HIGH (ref 70–99)
Potassium: 3 mmol/L — ABNORMAL LOW (ref 3.5–5.1)
Sodium: 141 mmol/L (ref 135–145)
Total Bilirubin: 0.5 mg/dL (ref 0.3–1.2)
Total Protein: 5.4 g/dL — ABNORMAL LOW (ref 6.5–8.1)

## 2020-06-06 LAB — CBC WITH DIFFERENTIAL/PLATELET
Abs Immature Granulocytes: 0.06 10*3/uL (ref 0.00–0.07)
Basophils Absolute: 0.1 10*3/uL (ref 0.0–0.1)
Basophils Relative: 1 %
Eosinophils Absolute: 0.2 10*3/uL (ref 0.0–0.5)
Eosinophils Relative: 2 %
HCT: 36.1 % — ABNORMAL LOW (ref 39.0–52.0)
Hemoglobin: 11 g/dL — ABNORMAL LOW (ref 13.0–17.0)
Immature Granulocytes: 1 %
Lymphocytes Relative: 23 %
Lymphs Abs: 1.7 10*3/uL (ref 0.7–4.0)
MCH: 27.7 pg (ref 26.0–34.0)
MCHC: 30.5 g/dL (ref 30.0–36.0)
MCV: 90.9 fL (ref 80.0–100.0)
Monocytes Absolute: 0.6 10*3/uL (ref 0.1–1.0)
Monocytes Relative: 8 %
Neutro Abs: 4.8 10*3/uL (ref 1.7–7.7)
Neutrophils Relative %: 65 %
Platelets: 296 10*3/uL (ref 150–400)
RBC: 3.97 MIL/uL — ABNORMAL LOW (ref 4.22–5.81)
RDW: 13.2 % (ref 11.5–15.5)
WBC: 7.4 10*3/uL (ref 4.0–10.5)
nRBC: 0 % (ref 0.0–0.2)

## 2020-06-06 LAB — BRAIN NATRIURETIC PEPTIDE: B Natriuretic Peptide: 218.2 pg/mL — ABNORMAL HIGH (ref 0.0–100.0)

## 2020-06-06 LAB — TROPONIN I (HIGH SENSITIVITY)
Troponin I (High Sensitivity): 31 ng/L — ABNORMAL HIGH (ref <18)
Troponin I (High Sensitivity): 35 ng/L — ABNORMAL HIGH (ref <18)

## 2020-06-06 MED ORDER — HYDROCODONE-ACETAMINOPHEN 5-325 MG PO TABS
2.0000 | ORAL_TABLET | Freq: Once | ORAL | Status: AC
  Administered 2020-06-06: 2 via ORAL
  Filled 2020-06-06: qty 2

## 2020-06-06 NOTE — ED Triage Notes (Signed)
Pt arrives via ems from home due to shob. Pt states he had a episode of sob yesterday morning that lasted about 35min. He states he experienced sob again this morning. Pt denies cp

## 2020-06-06 NOTE — ED Notes (Signed)
Patient discharge instructions reviewed with the patient. The patient verbalized understanding of instructions. Patient discharged. 

## 2020-06-07 NOTE — ED Provider Notes (Signed)
[facility] EMERGENCY DEPARTMENT Provider Note   CSN: [id number] Arrival date & time: 06/06/20  0350     History Chief Complaint  Patient presents with   Shortness of Breath    [name] [name] is a 70 y.o. male.  A couple episodes of dyspnea approximately 30-45 minutes after taking amiodarone on multiple occasions.    Shortness of Breath Severity:  Mild Timing:  Intermittent Progression:  Unable to specify Chronicity:  New Context: not activity   Relieved by:  Nothing Worsened by:  Nothing Ineffective treatments:  None tried Associated symptoms: no abdominal pain, no chest pain, no cough and no fever        Past Medical History:  Diagnosis Date   Anxiety    Arthritis    CAD (coronary artery disease)    a. 2010: DES to CTO of RCA. EF 55% b. 07/2016: cath showing total occlusion within previously placed RCA stent (collaterals present), severe stenosis along LCx and OM1 (treated with 2 overlapping DES). c. repeat cath in 01/2018 showing patent stents along LCx and OM with CTO of D2, CTO of distal LCx, and CTO of RCA with collaterals present overall unchanged since 2018 with medical management recom   Cellulitis and abscess rt groin    Complication of anesthesia    " I woke up during a colonoscopy "      Depression    Diabetes mellitus    Disorders of iron metabolism    GERD (gastroesophageal reflux disease)    Hyperlipidemia    Hypertension    Low serum testosterone level    Medically noncompliant    Myocardial infarction [facility])     Patient Active Problem List   Diagnosis Date Noted   Non-ST elevation (NSTEMI) myocardial infarction ([place]) 05/23/2020   Orthostatic hypotension 01/01/2020   Drug-induced myopathy 12/06/2019   Diarrhea 05/10/2018   PAF (paroxysmal atrial fibrillation) ([place]) 03/29/2018   Lacunar infarction ([place]) 03/29/2018   Unstable angina ([facility])    Chest pain 01/18/2018   CHF (congestive heart failure) ([place])  10/21/2017   Anxiety 03/09/2017   Constipation 11/26/2016   Diabetes ([place]) 05/25/2016   Occlusion and stenosis of vertebral artery    Acute renal failure ([place]) 05/23/2016   Diabetic neuropathy ([place]) 01/13/2016   Depression 11/01/2015   Erectile dysfunction 01/23/2015   Morbid obesity ([place]) 01/23/2015   Periodontal disease 01/23/2015   Stopped smoking with greater than 30 pack year history 01/23/2015   Diabetic peripheral neuropathy associated with type 2 diabetes mellitus ([place]) 11/08/2014   Mucosal abnormality of stomach    History of colonic polyps    Abdominal pain 09/06/2014   GERD (gastroesophageal reflux disease) 09/06/2014   Low serum testosterone level 08/01/2014   DDD (degenerative disc disease), cervical 12/20/2013   Headache 08/07/2013   Medically noncompliant 10/25/2011   CAD S/P percutaneous coronary angioplasty 10/25/2011   MURMUR 04/16/2009   Hypertension associated with diabetes ([place]) 08/02/2008   Hyperlipidemia associated with type 2 diabetes mellitus ([place]) 07/31/2008   DISORDERS OF IRON METABOLISM 07/31/2008    Past Surgical History:  Procedure Laterality Date   ABDOMINAL AORTOGRAM W/LOWER EXTREMITY N/A 03/21/2019   Procedure: ABDOMINAL AORTOGRAM W/LOWER EXTREMITY;  Surgeon: [name], MD;  Location: [facility];  Service: Cardiovascular;  Laterality: N/A;   BACK SURGERY  2015   ACDF by Dr. [name]   COLONOSCOPY N/A 10/01/2014   Dr. [name]: multiple tubular adenomas removed, colonic diverticulosis, redundant colon. next tcs advised for 09/2017.  PATIENT NEEDS PROPOFOL FOR FAILED CONSCIOUS SEDATION   CORONARY STENT INTERVENTION N/A 07/30/2016   Procedure: Coronary Stent Intervention;  Surgeon: [name], MD;  Location: [facility];  Service: Cardiovascular;  Laterality: N/A;   CORONARY STENT PLACEMENT  2000   By Dr. Olevia Perches   EP IMPLANTABLE DEVICE N/A 05/25/2016   Procedure: Loop Recorder Insertion;  Surgeon: [name], MD;  Location: [facility];  Service: Cardiovascular;  Laterality: N/A;   ESOPHAGOGASTRODUODENOSCOPY     esophagus [facility] at [facility]   ESOPHAGOGASTRODUODENOSCOPY N/A 10/01/2014   Dr. [name]: patchy mottling/erythema and minimal polypoid appearance of gastric mucosa. bx with mild inlammation but no H.pylori   HERNIA REPAIR  3220   umbilical   LEFT HEART CATH AND CORONARY ANGIOGRAPHY N/A 07/30/2016   Procedure: Left Heart Cath and Coronary Angiography;  Surgeon: [name], MD;  Location: [facility];  Service: Cardiovascular;  Laterality: N/A;   LEFT HEART CATH AND CORONARY ANGIOGRAPHY N/A 01/19/2018   Procedure: LEFT HEART CATH AND CORONARY ANGIOGRAPHY;  Surgeon: [name], MD;  Location: [facility];  Service: Cardiovascular;  Laterality: N/A;   LEFT HEART CATH AND CORONARY ANGIOGRAPHY N/A 05/24/2020   Procedure: LEFT HEART CATH AND CORONARY ANGIOGRAPHY;  Surgeon: Burnell Blanks, MD;  Location: [facility];  Service: Cardiovascular;  Laterality: N/A;   LESION REMOVAL     Lip and hand    LOWER EXTREMITY ANGIOGRAPHY N/A 04/18/2019   Procedure: LOWER EXTREMITY ANGIOGRAPHY;  Surgeon: [name], MD;  Location: [facility];  Service: Cardiovascular;  Laterality: N/A;   NECK SURGERY     PERIPHERAL VASCULAR BALLOON ANGIOPLASTY  04/18/2019   Procedure: PERIPHERAL VASCULAR BALLOON ANGIOPLASTY;  Surgeon: [name], MD;  Location: [facility];  Service: Cardiovascular;;   PERIPHERAL VASCULAR INTERVENTION Right 03/21/2019   Procedure: PERIPHERAL VASCULAR INTERVENTION;  Surgeon: [name], MD;  Location: [facility];  Service: Cardiovascular;  Laterality: Right;  superficial femoral       Family History  Problem Relation Age of Onset   Diabetes Father    Valvular heart disease Father    Arthritis Father    Heart disease Father    Stroke Father    Alzheimer's disease Mother     Hyperlipidemia Mother    Hypertension Mother    Arthritis Mother    Lung cancer Mother    Stroke Mother    Headache Mother    Arthritis/Rheumatoid Sister    Diabetes Sister    Hypertension Sister    Hyperlipidemia Sister    Depression Sister    Dementia Maternal Aunt    Dementia Maternal Uncle    Heart disease Maternal Uncle    Stomach cancer Paternal Uncle    Colon cancer Neg Hx    Liver disease Neg Hx     Social History   Tobacco Use   Smoking status: Former Smoker    Packs/day: 0.25    Years: 51.00    Pack years: 12.75    Types: Cigarettes    Quit date: 08/14/2016    Years since quitting: 3.8   Smokeless tobacco: Never Used   Tobacco comment: smokes  a pack a week  Vaping Use   Vaping Use: Never used  Substance Use Topics   Alcohol use: No    Alcohol/week: 0.0 standard drinks   Drug use: No    Home Medications Prior to Admission medications  Medication Sig Start Date End Date Taking? Authorizing Provider  albuterol (VENTOLIN HFA) 108 (90 Base) MCG/ACT inhaler Inhale 2 puffs into the lungs every 6 (six) hours as needed for wheezing or shortness of breath. 08/18/19   Josue Hector, MD  AMBULATORY NON FORMULARY MEDICATION Take 180 mg by mouth daily at 12 noon. Medication Name: Clear Study Drug-Bempodic acid versus placebo 03/29/17   [name], MD  amiodarone (PACERONE) 200 MG tablet Take 1 tablet (200 mg total) by mouth in the morning and at bedtime. 05/31/20   Josue Hector, MD  apixaban (ELIQUIS) 5 MG TABS tablet Take 1 tablet (5 mg total) by mouth 2 (two) times daily. 05/13/20   Josue Hector, MD  aspirin EC 81 MG EC tablet Take 1 tablet (81 mg total) by mouth daily. Swallow whole. 05/26/20 06/25/20  Ahmed Prima, Fransisco Hertz, PA-C  Chlorphen-Phenyleph-ASA (ALKA-SELTZER PLUS COLD PO) Take 2 tablets by mouth daily as needed (allergies).    [provider]  clopidogrel (PLAVIX) 75 MG tablet Take 1 tablet by mouth once daily 05/20/20    [name], MD  dapagliflozin propanediol (FARXIGA) 5 MG TABS tablet Take 1 tablet (5 mg total) by mouth daily before breakfast. 02/27/20   [name], FNP  DULoxetine (CYMBALTA) 30 MG capsule Take 1 capsule (30 mg total) by mouth daily. 07/26/19   [name] A, FNP  fenofibrate 160 MG tablet TAKE 1 TABLET BY MOUTH ONCE DAILY FOR CHOLESTEROL AND TRIGLYCERIDES 07/26/19   [name] A, FNP  furosemide (LASIX) 20 MG tablet TAKE 1 TABLET BY MOUTH ONCE DAILY AS NEEDED FOR EDEMA 03/06/20   [name] A, FNP  gabapentin (NEURONTIN) 300 MG capsule Take 900 mg by mouth 2 (two) times daily.    [provider]  HUMALOG KWIKPEN 100 UNIT/ML KwikPen INJECT 5 TO 20 UNITS SUBCUTANEOUSLY THREE TIMES DAILY WITH MEALS 11/13/19   [name] A, FNP  isosorbide mononitrate (IMDUR) 30 MG 24 hr tablet Take 1 tablet (30 mg total) by mouth daily. 07/26/19   [name], FNP  linaclotide (LINZESS) 72 MCG capsule Take 1 capsule (72 mcg total) by mouth daily as needed (constipation). 07/26/19   [name], FNP  Menthol, Topical Analgesic, (BLUE-EMU MAXIMUM STRENGTH EX) Apply 1 application topically daily as needed (back pain).    [provider]  metoprolol succinate (TOPROL-XL) 50 MG 24 hr tablet Take 1 tablet by mouth once daily 01/17/20   Josue Hector, MD  metoprolol tartrate (LOPRESSOR) 50 MG tablet Take 50 mg by mouth daily as needed (a-fib).    [provider]  nitroGLYCERIN (NITROSTAT) 0.4 MG SL tablet Place 1 tablet (0.4 mg total) under the tongue every 5 (five) minutes x 3 doses as needed for chest pain. 03/08/19   [name], MD  pantoprazole (PROTONIX) 40 MG tablet Take 1 tablet by mouth once daily 12/20/19   [name] A, FNP  Semaglutide, 1 MG/DOSE, (OZEMPIC, 1 MG/DOSE,) 2 MG/1.5ML SOPN Inject 0.75 mLs (1 mg total) into the skin once a week. 12/12/19   [name] A, FNP  telmisartan (MICARDIS) 20 MG tablet Take 1 tablet (20 mg total) by mouth  daily. Patient taking differently: Take 10 mg by mouth daily. 10/30/19   Hawks, Christy A, FNP  TRESIBA FLEXTOUCH 200 UNIT/ML FlexTouch Pen Inject 80 Units into the skin in the morning and at bedtime. 05/16/20   [name], FNP  triamcinolone (NASACORT) 55 MCG/ACT AERO nasal inhaler  Place 1 spray into the nose as needed (allergies).    [provider]  vitamin B-12 (CYANOCOBALAMIN) 1000 MCG tablet Take 1,000 mcg by mouth daily.    [provider]    Allergies    Shellfish allergy, Sulfa antibiotics, Ace inhibitors, Invokana [canagliflozin], Lisinopril, Metformin and related, Pravastatin sodium, Milk-related compounds, Crestor [rosuvastatin], Fenofibrate, Horse-derived products, Iodine, Lexapro [escitalopram oxalate], Lipitor [atorvastatin], Livalo [pitavastatin], Repatha [evolocumab], and Tape  Review of Systems   Review of Systems  Constitutional: Negative for fever.  Respiratory: Positive for shortness of breath. Negative for cough.   Cardiovascular: Negative for chest pain.  Gastrointestinal: Negative for abdominal pain.  All other systems reviewed and are negative.   Physical Exam Updated Vital Signs BP (!) 156/92    Pulse 90    Temp 98 F (36.7 C)    Resp 20    Ht 6\' 2"  (1.88 m)    Wt 131.5 kg    SpO2 97%    BMI 37.23 kg/m   Physical Exam Vitals and nursing note reviewed.  Constitutional:      Appearance: He is well-developed and well-nourished.  HENT:     Head: Normocephalic and atraumatic.  Cardiovascular:     Rate and Rhythm: Normal rate.  Pulmonary:     Effort: Pulmonary effort is normal. No respiratory distress.     Breath sounds: No decreased breath sounds or wheezing.  Abdominal:     General: There is no distension.  Musculoskeletal:        General: Normal range of motion.     Cervical back: Normal range of motion.     Right lower leg: No edema.     Left lower leg: No edema.  Skin:    General: Skin is warm and dry.  Neurological:      General: No focal deficit present.     Mental Status: He is alert.     ED Results / Procedures / Treatments   Labs (all labs ordered are listed, but only abnormal results are displayed) Labs Reviewed  CBC WITH DIFFERENTIAL/PLATELET - Abnormal; Notable for the following components:      Result Value   RBC 3.97 (*)    Hemoglobin 11.0 (*)    HCT 36.1 (*)    All other components within normal limits  COMPREHENSIVE METABOLIC PANEL - Abnormal; Notable for the following components:   Potassium 3.0 (*)    CO2 21 (*)    Glucose, Bld 125 (*)    Calcium 7.3 (*)    Total Protein 5.4 (*)    Albumin 2.7 (*)    Alkaline Phosphatase 34 (*)    All other components within normal limits  BRAIN NATRIURETIC PEPTIDE - Abnormal; Notable for the following components:   B Natriuretic Peptide 218.2 (*)    All other components within normal limits  TROPONIN I (HIGH SENSITIVITY) - Abnormal; Notable for the following components:   Troponin I (High Sensitivity) 31 (*)    All other components within normal limits  TROPONIN I (HIGH SENSITIVITY) - Abnormal; Notable for the following components:   Troponin I (High Sensitivity) 35 (*)    All other components within normal limits    EKG EKG Interpretation  Date/Time:  Thursday June 06 2020 03:58:40 EST Ventricular Rate:  97 PR Interval:    QRS Duration: 104 QT Interval:  382 QTC Calculation: 486 R Axis:   77 Text Interpretation: Atrial fibrillation Minimal ST depression, inferior leads Borderline prolonged QT interval Confirmed by Anel Creighton,  Corene Cornea [phone]) on 06/06/2020 4:14:42 AM   Radiology DG Chest Portable 1 View  Result Date: 06/06/2020 CLINICAL DATA:  Shortness of breath. EXAM: PORTABLE CHEST 1 VIEW COMPARISON:  May 23, 2020. FINDINGS: A loop recorder is noted. The heart size remains enlarged. There is no pneumothorax or large pleural effusion. No acute osseous abnormality. No large pleural effusion. No focal infiltrate. IMPRESSION: No active  disease. Electronically Signed   By: Constance Holster M.D.   On: 06/06/2020 04:27    Procedures Procedures (including critical care time)  Medications Ordered in ED Medications  HYDROcodone-acetaminophen (NORCO/VICODIN) 5-325 MG per tablet 2 tablet (2 tablets Oral Given 06/06/20 [id number])    ED Course  I have reviewed the triage vital signs and the nursing notes.  Pertinent labs & imaging results that were available during my care of the patient were reviewed by me and considered in my medical decision making (see chart for details).    MDM Rules/Calculators/A&P                          Possibly related to meds. Possibly pAfib. No hypoxia here. Doubt PE. Doubt ACS equivalent. Stable for discharge.  Final Clinical Impression(s) / ED Diagnoses Final diagnoses:  None    Rx / DC Orders ED Discharge Orders    None       Nyra Anspaugh, Corene Cornea, MD 06/07/20 [phone]

## 2020-06-13 ENCOUNTER — Other Ambulatory Visit: Payer: Self-pay | Admitting: Family

## 2020-06-13 DIAGNOSIS — I5030 Unspecified diastolic (congestive) heart failure: Secondary | ICD-10-CM

## 2020-06-13 DIAGNOSIS — Z9861 Coronary angioplasty status: Secondary | ICD-10-CM

## 2020-06-13 DIAGNOSIS — I2510 Atherosclerotic heart disease of native coronary artery without angina pectoris: Secondary | ICD-10-CM

## 2020-06-14 ENCOUNTER — Encounter: Payer: Self-pay | Admitting: Student

## 2020-06-14 ENCOUNTER — Other Ambulatory Visit: Payer: Self-pay

## 2020-06-14 ENCOUNTER — Ambulatory Visit: Payer: Medicare Other | Admitting: Student

## 2020-06-14 ENCOUNTER — Telehealth: Payer: Self-pay

## 2020-06-14 VITALS — BP 144/78 | HR 70 | Ht 74.0 in | Wt 295.0 lb

## 2020-06-14 DIAGNOSIS — E876 Hypokalemia: Secondary | ICD-10-CM

## 2020-06-14 DIAGNOSIS — G720 Drug-induced myopathy: Secondary | ICD-10-CM | POA: Diagnosis not present

## 2020-06-14 DIAGNOSIS — I739 Peripheral vascular disease, unspecified: Secondary | ICD-10-CM | POA: Diagnosis not present

## 2020-06-14 DIAGNOSIS — I10 Essential (primary) hypertension: Secondary | ICD-10-CM

## 2020-06-14 DIAGNOSIS — Z79899 Other long term (current) drug therapy: Secondary | ICD-10-CM

## 2020-06-14 DIAGNOSIS — I2510 Atherosclerotic heart disease of native coronary artery without angina pectoris: Secondary | ICD-10-CM

## 2020-06-14 DIAGNOSIS — I480 Paroxysmal atrial fibrillation: Secondary | ICD-10-CM | POA: Diagnosis not present

## 2020-06-14 DIAGNOSIS — T466X5A Adverse effect of antihyperlipidemic and antiarteriosclerotic drugs, initial encounter: Secondary | ICD-10-CM | POA: Diagnosis not present

## 2020-06-14 DIAGNOSIS — Z006 Encounter for examination for normal comparison and control in clinical research program: Secondary | ICD-10-CM

## 2020-06-14 DIAGNOSIS — E785 Hyperlipidemia, unspecified: Secondary | ICD-10-CM | POA: Diagnosis not present

## 2020-06-14 MED ORDER — AMIODARONE HCL 200 MG PO TABS: 200.0000 mg | ORAL_TABLET | Freq: Every day | ORAL | 3 refills | Status: DC

## 2020-06-14 NOTE — Patient Instructions (Signed)
Medication Instructions:   June 24, 2020: STOP Aspirin 81 mg tablets and REDUCE Amiodarone to ONCE DAILY.    *If you need a refill on your cardiac medications before your next appointment, please call your pharmacy*   Lab Work: BMET, TSH If you have labs (blood work) drawn today and your tests are completely normal, you will receive your results only by: Marland Kitchen MyChart Message (if you have MyChart) OR . A paper copy in the mail If you have any lab test that is abnormal or we need to change your treatment, we will call you to review the results.   Testing/Procedures: None Today   Follow-Up: At [facility], you and your health needs are our priority.  As part of our continuing mission to provide you with exceptional heart care, we have created designated Provider Care Teams.  These Care Teams include your primary Cardiologist (physician) and Advanced Practice Providers (APPs -  Physician Assistants and Nurse Practitioners) who all work together to provide you with the care you need, when you need it.  We recommend signing up for the patient portal called "MyChart".  Sign up information is provided on this After Visit Summary.  MyChart is used to connect with patients for Virtual Visits (Telemedicine).  Patients are able to view lab/test results, encounter notes, upcoming appointments, etc.  Non-urgent messages can be sent to your provider as well.   To learn more about what you can do with MyChart, go to [email or web address].    Your next appointment:   2-3 month(s)  The format for your next appointment:   In Person  Provider:   Jenkins Rouge, MD   Other Instructions None Today

## 2020-06-14 NOTE — Progress Notes (Signed)
Cardiology Office Note    Date:  06/15/2020   ID:  MILLER GRIMMETT, DOB 1951/01/07, MRN [id number]  PCP:  [name], FNP  Cardiologist: Jenkins Rouge, MD    Chief Complaint  Patient presents with  . Follow-up    3 week visit    History of Present Illness:    DEADRIAN QUENNEVILLE is a 70 y.o. male with past medical history of CAD (s/p DES to RCA in 2010, cath in 07/2016 showing CTO of RCA with collaterals, did receive DESx2 to LCx and OM1, repeat cath in09/2019 showing patent stents along LCx and OM with CTO of D2, CTO of distal LCx, and CTO of RCA with collaterals present overall unchanged since 2018 with medical management recommended), paroxysmal atrial fibrillation, carotid artery stenosis, PAD, HTN, HLD(intolerant to statins and Repatha), IDDM, and prior CVA who presents to the office today for hospital follow-up.  He was most recently admitted to [facility] from 1/6 - 05/25/2020 for chest pain and was found to have an NSTEMI with peak HS Troponin at 265. He underwent a repeat cardiac catheterization which showed moderate mid-LAD stenosis which was similar to his prior cath and he had a known CTO of D1 and CTO of RCA and distal LCx. Cath did show severe restenosis of the proximal LCx and OM1 which was treated with 2 overlapping DES. He denied any recurrent pain during admission and was continued on ASA, Plavix and Eliquis for 30 days with plans to stop ASA afterwards.  He called the office several days later reporting dizziness and feeling like he was back in atrial fibrillation. He was evaluated in clinic by Dr. [name] and EKG confirmed atrial fibrillation with RVR. He was started on Amiodarone 200 mg twice daily and was encouraged to keep scheduled follow-up here in [place] to arrange for possible DCCV if still in atrial fibrillation. He did follow-up with Dr. Lovena Le on 06/05/2018 and an EKG was not performed but he was felt to be in normal sinus rhythm by examination. Was recommended to  reduce Amiodarone to 200 mg daily in 3 weeks.  In the interim, he presented to [facility] ED on 06/06/2020 for evaluation of worsening dyspnea for the past hour prior to arrival. [facility] Troponin values were flat at 31 and 35. K+ was low at 3.0. BNP was elevated to 218. CXR showed no acute findings. EKG showed rate-controlled atrial fibrillation, HR 97 with no acute ST abnormalities. I do not see where any changes were made to his medication regimen.  In talking with the patient and his wife today, he reports overall doing well since his recent Emergency Department visit. He denies any recent chest pain or palpitations. Breathing has been at baseline and he denies any orthopnea, PND or edema. He does take Lasix 20mg  daily.    Past Medical History:  Diagnosis Date  . Anxiety   . Arthritis   . CAD (coronary artery disease)    a. 2010: DES to CTO of RCA. EF 55% b. 07/2016: cath showing total occlusion within previously placed RCA stent (collaterals present), severe stenosis along LCx and OM1 (treated with 2 overlapping DES). c. repeat cath in 01/2018 showing patent stents along LCx and OM with CTO of D2, CTO of distal LCx, and CTO of RCA with collaterals present overall unchanged since 2018 with medical management recom  . Cellulitis and abscess rt groin   . Complication of anesthesia    " I woke up during a colonoscopy "     .  Depression   . Diabetes mellitus   . Disorders of iron metabolism   . GERD (gastroesophageal reflux disease)   . Hyperlipidemia   . Hypertension   . Low serum testosterone level   . Medically noncompliant   . Myocardial infarction [facility])     Past Surgical History:  Procedure Laterality Date  . ABDOMINAL AORTOGRAM W/LOWER EXTREMITY N/A 03/21/2019   Procedure: ABDOMINAL AORTOGRAM W/LOWER EXTREMITY;  Surgeon: [name], MD;  Location: [place] CV [facility];  Service: Cardiovascular;  Laterality: N/A;  . BACK SURGERY  2015   ACDF by Dr. [name]  . COLONOSCOPY N/A 10/01/2014    Dr. [name]: multiple tubular adenomas removed, colonic diverticulosis, redundant colon. next tcs advised for 09/2017. PATIENT NEEDS PROPOFOL FOR FAILED CONSCIOUS SEDATION  . CORONARY STENT INTERVENTION N/A 07/30/2016   Procedure: Coronary Stent Intervention;  Surgeon: [name], MD;  Location: [place] [facility];  Service: Cardiovascular;  Laterality: N/A;  . CORONARY STENT PLACEMENT  2000   By Dr. Olevia Perches  . EP IMPLANTABLE DEVICE N/A 05/25/2016   Procedure: Loop Recorder Insertion;  Surgeon: [name], MD;  Location: [place] [facility];  Service: Cardiovascular;  Laterality: N/A;  . ESOPHAGOGASTRODUODENOSCOPY     esophagus stretched [facility] at [facility]  . ESOPHAGOGASTRODUODENOSCOPY N/A 10/01/2014   Dr. [name]: patchy mottling/erythema and minimal polypoid appearance of gastric mucosa. bx with mild inlammation but no H.pylori  . HERNIA REPAIR  [id number]   umbilical  . LEFT HEART CATH AND CORONARY ANGIOGRAPHY N/A 07/30/2016   Procedure: Left Heart Cath and Coronary Angiography;  Surgeon: [name], MD;  Location: [place] [facility];  Service: Cardiovascular;  Laterality: N/A;  . LEFT HEART CATH AND CORONARY ANGIOGRAPHY N/A 01/19/2018   Procedure: LEFT HEART CATH AND CORONARY ANGIOGRAPHY;  Surgeon: [name], MD;  Location: [place] [facility];  Service: Cardiovascular;  Laterality: N/A;  . LEFT HEART CATH AND CORONARY ANGIOGRAPHY N/A 05/24/2020   Procedure: LEFT HEART CATH AND CORONARY ANGIOGRAPHY;  Surgeon: Burnell Blanks, MD;  Location: [place] [facility];  Service: Cardiovascular;  Laterality: N/A;  . LESION REMOVAL     Lip and hand   . LOWER EXTREMITY ANGIOGRAPHY N/A 04/18/2019   Procedure: LOWER EXTREMITY ANGIOGRAPHY;  Surgeon: [name], MD;  Location: [place] [facility];  Service: Cardiovascular;  Laterality: N/A;  . NECK SURGERY    . PERIPHERAL VASCULAR BALLOON ANGIOPLASTY  04/18/2019   Procedure: PERIPHERAL VASCULAR BALLOON ANGIOPLASTY;  Surgeon: [name],  MD;  Location: [place] [facility];  Service: Cardiovascular;;  . PERIPHERAL VASCULAR INTERVENTION Right 03/21/2019   Procedure: PERIPHERAL VASCULAR INTERVENTION;  Surgeon: [name], MD;  Location: [place] [facility];  Service: Cardiovascular;  Laterality: Right;  superficial femoral    Current Medications: Outpatient Medications Prior to Visit  Medication Sig Dispense Refill  . albuterol (VENTOLIN HFA) 108 (90 Base) MCG/ACT inhaler Inhale 2 puffs into the lungs every 6 (six) hours as needed for wheezing or shortness of breath. 8.5 g 3  . AMBULATORY NON FORMULARY MEDICATION Take 180 mg by mouth daily at 12 noon. Medication Name: Clear Study Drug-Bempodic acid versus placebo    . apixaban (ELIQUIS) 5 MG TABS tablet Take 1 tablet (5 mg total) by mouth 2 (two) times daily. 180 tablet 1  . Chlorphen-Phenyleph-ASA (ALKA-SELTZER PLUS COLD PO) Take 2 tablets by mouth daily as needed (allergies).    . clopidogrel (PLAVIX) 75 MG tablet Take 1 tablet by mouth once  daily 90 tablet 0  . dapagliflozin propanediol (FARXIGA) 5 MG TABS tablet Take 1 tablet (5 mg total) by mouth daily before breakfast. 30 tablet 3  . DULoxetine (CYMBALTA) 30 MG capsule Take 1 capsule (30 mg total) by mouth daily. 90 capsule 4  . fenofibrate 160 MG tablet TAKE 1 TABLET BY MOUTH ONCE DAILY FOR CHOLESTEROL AND TRIGLYCERIDES 90 tablet 4  . furosemide (LASIX) 20 MG tablet TAKE 1 TABLET BY MOUTH ONCE DAILY AS NEEDED FOR EDEMA 90 tablet 0  . gabapentin (NEURONTIN) 300 MG capsule TAKE 2 CAPSULES BY MOUTH THREE TIMES DAILY 180 capsule 0  . HUMALOG KWIKPEN 100 UNIT/ML KwikPen INJECT 5 TO 20 UNITS SUBCUTANEOUSLY THREE TIMES DAILY WITH MEALS 15 mL 0  . isosorbide mononitrate (IMDUR) 30 MG 24 hr tablet Take 1 tablet (30 mg total) by mouth daily. 90 tablet 4  . linaclotide (LINZESS) 72 MCG capsule Take 1 capsule (72 mcg total) by mouth daily as needed (constipation). 90 capsule 3  . Menthol, Topical Analgesic, (BLUE-EMU MAXIMUM STRENGTH  EX) Apply 1 application topically daily as needed (back pain).    . metoprolol succinate (TOPROL-XL) 50 MG 24 hr tablet Take 1 tablet by mouth once daily 90 tablet 1  . metoprolol tartrate (LOPRESSOR) 50 MG tablet Take 50 mg by mouth daily as needed (a-fib).    . nitroGLYCERIN (NITROSTAT) 0.4 MG SL tablet Place 1 tablet (0.4 mg total) under the tongue every 5 (five) minutes x 3 doses as needed for chest pain. 25 tablet 3  . pantoprazole (PROTONIX) 40 MG tablet Take 1 tablet by mouth once daily 90 tablet 1  . Semaglutide, 1 MG/DOSE, (OZEMPIC, 1 MG/DOSE,) 2 MG/1.5ML SOPN Inject 0.75 mLs (1 mg total) into the skin once a week. 12 mL 4  . telmisartan (MICARDIS) 20 MG tablet Take 1 tablet (20 mg total) by mouth daily. (Patient taking differently: Take 10 mg by mouth daily.) 90 tablet 1  . TRESIBA FLEXTOUCH 200 UNIT/ML FlexTouch Pen Inject 80 Units into the skin in the morning and at bedtime. 18 mL 3  . triamcinolone (NASACORT) 55 MCG/ACT AERO nasal inhaler Place 1 spray into the nose as needed (allergies).    . vitamin B-12 (CYANOCOBALAMIN) 1000 MCG tablet Take 1,000 mcg by mouth daily.    Marland Kitchen amiodarone (PACERONE) 200 MG tablet Take 1 tablet (200 mg total) by mouth in the morning and at bedtime. 60 tablet 1  . aspirin EC 81 MG EC tablet Take 1 tablet (81 mg total) by mouth daily. Swallow whole. 30 tablet 0   No facility-administered medications prior to visit.     Allergies:   Shellfish allergy, Sulfa antibiotics, Ace inhibitors, Invokana [canagliflozin], Lisinopril, Metformin and related, Pravastatin sodium, Milk-related compounds, Crestor [rosuvastatin], Fenofibrate, Horse-derived products, Iodine, Lexapro [escitalopram oxalate], Lipitor [atorvastatin], Livalo [pitavastatin], Repatha [evolocumab], and Tape   Social History   Socioeconomic History  . Marital status: Married    Spouse name: Not on file  . Number of children: 2  . Years of education: 5  . Highest education level: 12th grade   Occupational History  . Occupation: retired  Tobacco Use  . Smoking status: Former Smoker    Packs/day: 0.25    Years: 51.00    Pack years: 12.75    Types: Cigarettes    Quit date: 08/14/2016    Years since quitting: 3.8  . Smokeless tobacco: Never Used  . Tobacco comment: smokes  a pack a week  Vaping Use  . Vaping Use:  Never used  Substance and Sexual Activity  . Alcohol use: No    Alcohol/week: 0.0 standard drinks  . Drug use: No  . Sexual activity: Yes  Other Topics Concern  . Not on file  Social History Narrative   Lives with his wife.  Retired.  Unable to afford expensive medicines.        Caffeine: 1 cup of 1/2 caff coffee in AM, drinks more if at a restaurant    Social Determinants of Health   Financial Resource Strain: Not on file  Food Insecurity: Not on file  Transportation Needs: Not on file  Physical Activity: Not on file  Stress: Not on file  Social Connections: Not on file     Family History:  The patient's family history includes Alzheimer's disease in his mother; Arthritis in his father and mother; Arthritis/Rheumatoid in his sister; Dementia in his maternal aunt and maternal uncle; Depression in his sister; Diabetes in his father and sister; Headache in his mother; Heart disease in his father and maternal uncle; Hyperlipidemia in his mother and sister; Hypertension in his mother and sister; Lung cancer in his mother; Stomach cancer in his paternal uncle; Stroke in his father and mother; Valvular heart disease in his father.   Review of Systems:   Please see the history of present illness.     General:  No chills, fever, night sweats or weight changes.  Cardiovascular:  No chest pain, dyspnea on exertion, edema, orthopnea,  paroxysmal nocturnal dyspnea. Positive for palpitations (now resolved).  Dermatological: No rash, lesions/masses Respiratory: No cough, dyspnea Urologic: No hematuria, dysuria Abdominal:   No nausea, vomiting, diarrhea, bright red  blood per rectum, melena, or hematemesis Neurologic:  No visual changes, wkns, changes in mental status. All other systems reviewed and are otherwise negative except as noted above.   Physical Exam:    VS:  BP (!) 144/78   Pulse 70   Ht 6\' 2"  (1.88 m)   Wt 295 lb (133.8 kg)   SpO2 97%   BMI 37.88 kg/m    General: Well developed, well nourished,male appearing in no acute distress. Head: Normocephalic, atraumatic. Neck: No carotid bruits. JVD not elevated.  Lungs: Respirations regular and unlabored, without wheezes or rales.  Heart: Regular rate and rhythm. No S3 or S4.  No murmur, no rubs, or gallops appreciated. Abdomen: Appears non-distended. No obvious abdominal masses. Msk:  Strength and tone appear normal for age. No obvious joint deformities or effusions. Extremities: No clubbing or cyanosis. No lower extremity edema.  Distal pedal pulses are 2+ bilaterally. Radial cath site stable without ecchymosis or evidence of a hematoma.  Neuro: Alert and oriented X 3. Moves all extremities spontaneously. No focal deficits noted. Psych:  Responds to questions appropriately with a normal affect. Skin: No rashes or lesions noted  Wt Readings from Last 3 Encounters:  06/14/20 295 lb (133.8 kg)  06/06/20 290 lb (131.5 kg)  06/05/20 297 lb (134.7 kg)     Studies/Labs Reviewed:   EKG:  EKG is ordered today.  The ekg ordered today demonstrates NSR, HR 73 with notched p-waves. No acute ST abnormalities. Reviewed with DOD as well given difference in p-wave morphology when compared to prior tracings.   Recent Labs: 01/01/2020: TSH 1.910 06/06/2020: ALT 15; B Natriuretic Peptide 218.2; BUN 15; Creatinine, Ser 1.12; Hemoglobin 11.0; Platelets 296; Potassium 3.0; Sodium 141   Lipid Panel    Component Value Date/Time   CHOL 172 05/23/2020 0051   CHOL 232 (  H) 02/14/2020 1135   TRIG 378 (H) 05/23/2020 0051   HDL 21 (L) 05/23/2020 0051   HDL 15 (L) 02/14/2020 1135   CHOLHDL 8.2 05/23/2020 0051    VLDL 76 (H) 05/23/2020 0051   LDLCALC 75 05/23/2020 0051   LDLCALC 108 (H) 02/14/2020 1135   LDLDIRECT 172 (H) 11/08/2014 0908    Additional studies/ records that were reviewed today include:   Echocardiogram: 10/2019 IMPRESSIONS    1. Inferior basal akinesis . Left ventricular ejection fraction, by  estimation, is 50 to 55%. The left ventricle has low normal function. The  left ventricle demonstrates regional wall motion abnormalities (see  scoring diagram/findings for description).  There is mild left ventricular hypertrophy. Left ventricular diastolic  parameters were normal.  2. Right ventricular systolic function is normal. The right ventricular  size is normal.  3. The mitral valve is normal in structure. Trivial mitral valve  regurgitation. No evidence of mitral stenosis.  4. Calcification of the non coronary cusp . The aortic valve is  tricuspid. Aortic valve regurgitation is not visualized. Mild to moderate  aortic valve sclerosis/calcification is present, without any evidence of  aortic stenosis.  5. The inferior vena cava is dilated in size with >50% respiratory  variability, suggesting right atrial pressure of 8 mmHg.   Cardiac Catheterization: 05/24/2020  Prox LAD lesion is 40% stenosed.  Ost 1st Diag lesion is 40% stenosed.  Ost 2nd Diag to 2nd Diag lesion is 100% stenosed.  Mid Cx to Dist Cx lesion is 100% stenosed.  Prox RCA to Mid RCA lesion is 90% stenosed.  Prox RCA lesion is 99% stenosed.  Ost 1st Mrg to 1st Mrg lesion is 80% stenosed.  A drug-eluting stent was successfully placed using a STENT RESOLUTE ONYX [id number].  Post intervention, there is a 0% residual stenosis.  Prox Cx lesion is 95% stenosed.  A drug-eluting stent was successfully placed using a STENT RESOLUTE ONYX 3.0X22.  Post intervention, there is a 0% residual stenosis.   1. Moderate mid LAD stenosis unchanged. Chronic occlusion Diagonal 1. Severe disease small diagonal  2.  2. Severe restenosis Circumflex/OM stents.  3. Successful PTCA/DES x 2 with overlapping Resolute Onyx DES in the Circumflex/OM 4. Chronic occlusion mid RCA in old stented segment. The distal vessel fills briskly from left to right collaterals.   Recommendations: Continue Plavix. Resume anticoagulation tomorrow. Will continue ASA 81 mg daily until he is fully anti-coagulated then OK to stop ASA.     Assessment:    1. Paroxysmal atrial fibrillation (HCC)   2. Coronary artery disease involving native coronary artery of native heart without angina pectoris   3. Medication management   4. PVD (peripheral vascular disease) ([place])   5. Essential hypertension   6. Hyperlipidemia LDL goal <70   7. Hypokalemia      Plan:   In order of problems listed above:  1. Paroxysmal Atrial Fibrillation - He denies any recent palpitations and did convert back to NSR since his recent Emergency Department visit. Will plan to continue Amiodarone 200mg  BID and reduce to 200mg  daily on 06/24/2020 as previously recommended by Dr. Lovena Le. Will continue Toprol-XL 50mg  daily. - He denies any evidence of active bleeding. Continue Eliquis 5mg  BID for anticoagulation.   2. CAD - He is s/p DES to RCA in 2010, cath in 07/2016 showing CTO of RCA with collaterals, did receive DESx2 to LCx and OM1, repeat cath in09/2019 showing patent stents along LCx and OM with CTO of D2,  CTO of distal LCx, and CTO of RCA with collaterals present overall unchanged since 2018 with medical management recommended. Was admitted for an NSTEMI earlier this month and cath showed severe restenosis of the proximal LCx and OM1 which was treated with 2 overlapping DES.  - He denies any recent chest pain and his breathing has been at baseline.  - Continue Plavix, Imdur and Toprol-XL. Will plan to stop ASA on 06/24/2020 to complete 30-days of therapy as he is also on anticoagulation.   3. PAD - Followed by Vascular Surgery and scheduled for  repeat imaging later this month. Remains on Plavix.   4. HTN - BP was slightly elevated at 144/78 during today's visit but was well-controlled during his prior admission. Will continue current regimen for now and can titrate Micardis if BP remains above goal.   5. HLD - LDL had improved to 75 when checked earlier this month. He has been intolerant to statins and Repatha. Continues to participate in the CLEAR trial.   6. Hypokalemia - K+ was low at 3.0 during recent ED evaluation. He is not on K+ supplementation and reports a history of hyperkalemia. He will initially increase K+ intake in his diet. Will recheck in 1-2 weeks and if still low, would start K-dur given the use of Lasix.     Medication Adjustments/Labs and Tests Ordered: Current medicines are reviewed at length with the patient today.  Concerns regarding medicines are outlined above.  Medication changes, Labs and Tests ordered today are listed in the Patient Instructions below. Patient Instructions  Medication Instructions:   June 24, 2020: STOP Aspirin 81 mg tablets and REDUCE Amiodarone to ONCE DAILY.    *If you need a refill on your cardiac medications before your next appointment, please call your pharmacy*   Lab Work: BMET, TSH If you have labs (blood work) drawn today and your tests are completely normal, you will receive your results only by: Marland Kitchen MyChart Message (if you have MyChart) OR . A paper copy in the mail If you have any lab test that is abnormal or we need to change your treatment, we will call you to review the results.   Testing/Procedures: None Today   Follow-Up: At [facility], you and your health needs are our priority.  As part of our continuing mission to provide you with exceptional heart care, we have created designated Provider Care Teams.  These Care Teams include your primary Cardiologist (physician) and Advanced Practice Providers (APPs -  Physician Assistants and Nurse Practitioners)  who all work together to provide you with the care you need, when you need it.  We recommend signing up for the patient portal called "MyChart".  Sign up information is provided on this After Visit Summary.  MyChart is used to connect with patients for Virtual Visits (Telemedicine).  Patients are able to view lab/test results, encounter notes, upcoming appointments, etc.  Non-urgent messages can be sent to your provider as well.   To learn more about what you can do with MyChart, go to [email or web address].    Your next appointment:   2-3 month(s)  The format for your next appointment:   In Person  Provider:   Jenkins Rouge, MD   Other Instructions None Today        Signed, Erma Heritage, PA-C  06/15/2020 11:28 AM    [facility] [address] [place], [place] [address] Phone: [phone] Fax: [phone]

## 2020-06-14 NOTE — Telephone Encounter (Signed)
Called subject for CLEAR T15 M39 telephone visit. Reviewed and updated concomitant medications. Reviewed and updated AEs. No SAE to report. Confirmed next appointment for 09/19/2020.

## 2020-06-15 ENCOUNTER — Encounter: Payer: Self-pay | Admitting: Student

## 2020-06-17 ENCOUNTER — Encounter (HOSPITAL_COMMUNITY): Payer: Medicare Other

## 2020-06-17 ENCOUNTER — Ambulatory Visit: Payer: Medicare Other | Admitting: Surgery

## 2020-06-18 ENCOUNTER — Telehealth: Payer: Self-pay | Admitting: Pharmacist

## 2020-06-18 NOTE — Telephone Encounter (Signed)
Called pt to see if he is interested in trying Leqvio. He is not interested at this time but will let us know if he changes his mind.

## 2020-06-20 ENCOUNTER — Other Ambulatory Visit: Payer: Self-pay

## 2020-06-20 ENCOUNTER — Other Ambulatory Visit: Payer: Self-pay | Admitting: Family

## 2020-06-20 ENCOUNTER — Other Ambulatory Visit: Payer: Medicare Other

## 2020-06-20 DIAGNOSIS — K219 Gastro-esophageal reflux disease without esophagitis: Secondary | ICD-10-CM

## 2020-06-20 DIAGNOSIS — Z79899 Other long term (current) drug therapy: Secondary | ICD-10-CM | POA: Diagnosis not present

## 2020-06-21 LAB — BASIC METABOLIC PANEL
BUN/Creatinine Ratio: 15 (ref 10–24)
BUN: 22 mg/dL (ref 8–27)
CO2: 25 mmol/L (ref 20–29)
Calcium: 9.7 mg/dL (ref 8.6–10.2)
Chloride: 95 mmol/L — ABNORMAL LOW (ref 96–106)
Creatinine, Ser: 1.5 mg/dL — ABNORMAL HIGH (ref 0.76–1.27)
GFR calc Af Amer: 54 mL/min/{1.73_m2} — ABNORMAL LOW (ref >59)
GFR calc non Af Amer: 47 mL/min/{1.73_m2} — ABNORMAL LOW (ref >59)
Glucose: 79 mg/dL (ref 65–99)
Potassium: 4.2 mmol/L (ref 3.5–5.2)
Sodium: 136 mmol/L (ref 134–144)

## 2020-06-21 LAB — TSH: TSH: 3.69 u[IU]/mL (ref 0.450–4.500)

## 2020-07-03 ENCOUNTER — Ambulatory Visit: Payer: Medicare Other | Admitting: Adult Health

## 2020-07-08 ENCOUNTER — Telehealth: Payer: Self-pay

## 2020-07-08 NOTE — Telephone Encounter (Signed)
Pt called requesting update on when he will get his diabetic medication from assistance program since he is almost out of his medicine and was told by Almyra Free that he should get it by the end of February.  Please call pt with update.

## 2020-07-08 NOTE — Telephone Encounter (Signed)
Spoke with patient and his wife about meds. Advised that [place] sent over info in December and gave them number to call for information. Patient verbalized understanding and will give them a call.

## 2020-07-10 ENCOUNTER — Telehealth: Payer: Self-pay | Admitting: *Deleted

## 2020-07-10 DIAGNOSIS — M5416 Radiculopathy, lumbar region: Secondary | ICD-10-CM | POA: Diagnosis not present

## 2020-07-10 DIAGNOSIS — M4807 Spinal stenosis, lumbosacral region: Secondary | ICD-10-CM | POA: Diagnosis not present

## 2020-07-10 DIAGNOSIS — M1711 Unilateral primary osteoarthritis, right knee: Secondary | ICD-10-CM | POA: Diagnosis not present

## 2020-07-10 DIAGNOSIS — M5136 Other intervertebral disc degeneration, lumbar region: Secondary | ICD-10-CM | POA: Diagnosis not present

## 2020-07-10 NOTE — Telephone Encounter (Signed)
   [facility] Pre-operative Risk Assessment    HEARTCARE STAFF: - Please ensure there is not already an duplicate clearance open for this procedure. - Under Visit Info/Reason for Call, type in Other and utilize the format Clearance MM/DD/YY or Clearance TBD. Do not use dashes or single digits. - If request is for dental extraction, please clarify the # of teeth to be extracted.  Request for surgical clearance:  1. What type of surgery is being performed? LEFT L4-S1 TFESI   2. When is this surgery scheduled? 08/06/20   3. What type of clearance is required (medical clearance vs. Pharmacy clearance to hold med vs. Both)? BOTH  4. Are there any medications that need to be held prior to surgery and how long? HOLD PLAVIX x 7 DAYS PRIOR; HOLD ELIQUIS x 3 DAYS PRIOR  5. Practice name and name of physician performing surgery? [facility]; MD NOT LISTED  6. What is the office phone number? [phone]   7.   What is the office fax number? [phone]  8.   Anesthesia type (None, local, MAC, general) ? NONE LISTED   Julaine Hua 07/10/2020, 11:41 AM  _________________________________________________________________   (provider comments below)

## 2020-07-10 NOTE — Telephone Encounter (Signed)
I think the earliest would be beginning of April

## 2020-07-10 NOTE — Telephone Encounter (Signed)
   Primary Cardiologist: Jenkins Rouge, MD  Chart reviewed and the patient and Dr [name] contacted today as part of pre-operative protocol coverage. Patient was contacted 07/10/2020 in reference to pre-operative risk assessment for pending surgery as outlined below.   Due to the patient's recent coronary stenting 05/24/2020 he is felt to be high risk for in stent restenosis if his Plavix is stopped.  Dr [name] feels the risk would be acceptable early April but holding off closer to 6 months would be optimal.  I discussed this with the patient today.    Kerin Ransom, PA-C 07/10/2020, 2:44 PM

## 2020-07-10 NOTE — Telephone Encounter (Signed)
Dr [name] this patient just had OM and CFX DES placed 05/24/2020. We have received a request to hold Plavix in this patient for a back injection.  I spoke with the patient on the phone today.  I explained that he could not stop Plavix at this time.  I told him I would touch base with you to see when you felt the earliest he could safely come off Plavix for a back injection would be.  Kerin Ransom PA-C 07/10/2020 1:59 PM

## 2020-07-11 ENCOUNTER — Other Ambulatory Visit: Payer: Self-pay | Admitting: Internal Medicine

## 2020-07-11 ENCOUNTER — Other Ambulatory Visit: Payer: Self-pay | Admitting: Family

## 2020-07-11 ENCOUNTER — Other Ambulatory Visit: Payer: Self-pay | Admitting: Cardiovascular Disease

## 2020-07-15 ENCOUNTER — Telehealth: Payer: Self-pay | Admitting: Family

## 2020-07-15 NOTE — Telephone Encounter (Signed)
Pt's wife calling about patient assistance that was started with Almyra Free

## 2020-07-16 NOTE — Telephone Encounter (Signed)
Patients wife states that form is missing some healthcare information.

## 2020-07-17 NOTE — Telephone Encounter (Signed)
Patients signature was missing on one form. Patient will be by to sign.

## 2020-07-18 ENCOUNTER — Encounter: Payer: Self-pay | Admitting: Family

## 2020-07-18 ENCOUNTER — Other Ambulatory Visit: Payer: Self-pay | Admitting: Family

## 2020-07-18 ENCOUNTER — Telehealth: Payer: Self-pay | Admitting: *Deleted

## 2020-07-18 ENCOUNTER — Encounter (HOSPITAL_COMMUNITY): Payer: Medicare Other

## 2020-07-18 ENCOUNTER — Ambulatory Visit (INDEPENDENT_AMBULATORY_CARE_PROVIDER_SITE_OTHER): Payer: Medicare Other | Admitting: Family

## 2020-07-18 ENCOUNTER — Other Ambulatory Visit: Payer: Self-pay

## 2020-07-18 VITALS — BP 89/56 | HR 71 | Temp 96.0°F | Ht 74.0 in | Wt 290.6 lb

## 2020-07-18 DIAGNOSIS — R42 Dizziness and giddiness: Secondary | ICD-10-CM

## 2020-07-18 DIAGNOSIS — I252 Old myocardial infarction: Secondary | ICD-10-CM

## 2020-07-18 DIAGNOSIS — R531 Weakness: Secondary | ICD-10-CM

## 2020-07-18 DIAGNOSIS — I959 Hypotension, unspecified: Secondary | ICD-10-CM

## 2020-07-18 DIAGNOSIS — I480 Paroxysmal atrial fibrillation: Secondary | ICD-10-CM

## 2020-07-18 LAB — CBC WITH DIFFERENTIAL/PLATELET
Basophils Absolute: 0.1 10*3/uL (ref 0.0–0.2)
Basos: 1 %
EOS (ABSOLUTE): 0.2 10*3/uL (ref 0.0–0.4)
Eos: 3 %
Hematocrit: 44.2 % (ref 37.5–51.0)
Hemoglobin: 14.6 g/dL (ref 13.0–17.7)
Immature Grans (Abs): 0 10*3/uL (ref 0.0–0.1)
Immature Granulocytes: 1 %
Lymphocytes Absolute: 1 10*3/uL (ref 0.7–3.1)
Lymphs: 15 %
MCH: 27.2 pg (ref 26.6–33.0)
MCHC: 33 g/dL (ref 31.5–35.7)
MCV: 82 fL (ref 79–97)
Monocytes Absolute: 0.9 10*3/uL (ref 0.1–0.9)
Monocytes: 13 %
Neutrophils Absolute: 4.5 10*3/uL (ref 1.4–7.0)
Neutrophils: 67 %
Platelets: 314 10*3/uL (ref 150–450)
RBC: 5.37 x10E6/uL (ref 4.14–5.80)
RDW: 12.9 % (ref 11.6–15.4)
WBC: 6.6 10*3/uL (ref 3.4–10.8)

## 2020-07-18 LAB — BMP8+EGFR
BUN/Creatinine Ratio: 13 (ref 10–24)
BUN: 23 mg/dL (ref 8–27)
CO2: 24 mmol/L (ref 20–29)
Calcium: 10 mg/dL (ref 8.6–10.2)
Chloride: 94 mmol/L — ABNORMAL LOW (ref 96–106)
Creatinine, Ser: 1.74 mg/dL — ABNORMAL HIGH (ref 0.76–1.27)
Glucose: 143 mg/dL — ABNORMAL HIGH (ref 65–99)
Potassium: 4.4 mmol/L (ref 3.5–5.2)
Sodium: 140 mmol/L (ref 134–144)
eGFR: 42 mL/min/{1.73_m2} — ABNORMAL LOW (ref >59)

## 2020-07-18 MED ORDER — GABAPENTIN 300 MG PO CAPS: 600.0000 mg | ORAL_CAPSULE | Freq: Three times a day (TID) | ORAL | 3 refills | Status: DC

## 2020-07-18 MED ORDER — AMIODARONE HCL 100 MG PO TABS: 100.0000 mg | ORAL_TABLET | Freq: Every day | ORAL | 1 refills | Status: DC

## 2020-07-18 NOTE — Telephone Encounter (Signed)
Approved today Request Reference Number: [id number]. AMIODARONE TAB 100MG  is approved through 05/17/2021. Your patient may now fill this prescription and it will be covered.  WM aware

## 2020-07-18 NOTE — Telephone Encounter (Signed)
As previously recommended patient can not hold Plavix unit beginning of April so need to cancel procedure in March.   Patient has appointment with Dr. [name] 08/26/2020. Will review further recommendation at that time.    I will route this recommendation to the requesting party via Epic fax function and remove from pre-op pool.  Please call with questions.  [place], [place] 07/18/2020, 1:47 PM

## 2020-07-18 NOTE — Telephone Encounter (Signed)
SEE CLEARANCE NOTES: our office received clearance request today for same procedure in the current clearance.  In this request they have also asked for clearance for both Plavix x 7 days prior and Eliquis x 3 days prior to Left L4-5, L5-S1 TFESI. I will send to pre op for assessment of Dr. Kyla Balzarine recommendations.

## 2020-07-18 NOTE — Progress Notes (Signed)
Subjective:    Patient ID: [name] [name], male    DOB: 1950-10-08, 70 y.o.   MRN: [id number]  Chief Complaint  Patient presents with  . Fatigue    Since he started taking the pacerone started with 2 a day and they backed it down to one a day    Pt presents to the office today with complaints of fatigue and weakness since his NSTEMI on 05/23/20.  He has A Fib and is followed by Cardiologists. He was placed on Amiodarone because of dizziness and SOB related to A Fib.   He denies any SOB. He continues to have dizziness with standing. He fell yesterday when he stood up from his chair.  He is taking his Lasix 20 mg daily and reports his swelling better.   Dizziness This is a chronic problem. The current episode started more than 1 year ago. The problem occurs intermittently. The problem has been waxing and waning.      Review of Systems  Neurological: Positive for dizziness.  All other systems reviewed and are negative.      Objective:   Physical Exam Vitals reviewed.  Constitutional:      General: He is not in acute distress.    Appearance: He is well-developed and well-nourished. He is obese.  HENT:     Head: Normocephalic.     Mouth/Throat:     Mouth: Oropharynx is clear and moist.  Eyes:     General:        Right eye: No discharge.        Left eye: No discharge.     Pupils: Pupils are equal, round, and reactive to light.  Neck:     Thyroid: No thyromegaly.  Cardiovascular:     Rate and Rhythm: Normal rate. Rhythm irregular.     Pulses: Intact distal pulses.     Heart sounds: Normal heart sounds. No murmur heard.   Pulmonary:     Effort: Pulmonary effort is normal. No respiratory distress.     Breath sounds: Normal breath sounds. No wheezing.  Abdominal:     General: Bowel sounds are normal. There is no distension.     Palpations: Abdomen is soft.     Tenderness: There is no abdominal tenderness.  Musculoskeletal:        General: No tenderness. Normal range of  motion.     Cervical back: Normal range of motion and neck supple.     Right lower leg: Edema (trace) present.     Left lower leg: Edema (trace) present.  Skin:    General: Skin is warm and dry.     Findings: No erythema or rash.  Neurological:     Mental Status: He is alert and oriented to person, place, and time.     Cranial Nerves: No cranial nerve deficit.     Motor: Weakness present.     Deep Tendon Reflexes: Reflexes are normal and symmetric.  Psychiatric:        Mood and Affect: Mood and affect normal.        Behavior: Behavior normal.        Thought Content: Thought content normal.        Judgment: Judgment normal.          BP 92/64   Pulse 71   Temp (!) 96 F (35.6 C) (Temporal)   Ht $R'6\' 2"'Pf$  (1.88 m)   Wt 290 lb 9.6 oz (131.8 kg)   SpO2 98%  BMI 37.31 kg/m   Assessment & Plan:  JED KUTCH comes in today with chief complaint of Fatigue (Since he started taking the pacerone started with 2 a day and they backed it down to one a day )   Diagnosis and orders addressed:  1. Dizziness Will decrease Pacerone to 100 mg today from 200 mg Call Cardiologists today!  - EKG 12-Lead - amiodarone (PACERONE) 100 MG tablet; Take 1 tablet (100 mg total) by mouth daily.  Dispense: 90 tablet; Refill: 1 - CBC with Differential/Platelet - BMP8+EGFR  2. PAF (paroxysmal atrial fibrillation) (HCC) - EKG 12-Lead - amiodarone (PACERONE) 100 MG tablet; Take 1 tablet (100 mg total) by mouth daily.  Dispense: 90 tablet; Refill: 1 - CBC with Differential/Platelet - BMP8+EGFR  3. H/O non-ST elevation myocardial infarction (NSTEMI) - EKG 12-Lead - CBC with Differential/Platelet - BMP8+EGFR  4. Weakness - EKG 12-Lead - CBC with Differential/Platelet - BMP8+EGFR  5. Hypotension, unspecified hypotension type When patient was leaving, he stood up and became light headed.  Told to hold all BP medications today as long as his BP is <100/60. Call Cardiologists today. Discussed  about sending to ED, but patient refuses. He states he will go home and "take it easy".   Labs pending Health Maintenance reviewed Diet and exercise encouraged  Follow up plan: 1 months    [name], FNP

## 2020-07-18 NOTE — Telephone Encounter (Signed)
Pacerone 100MG  tablets PA started Key: [id number] Sent to plan for urgent response

## 2020-07-18 NOTE — Patient Instructions (Signed)
Atrial Fibrillation  Atrial fibrillation is a type of irregular or rapid heartbeat (arrhythmia). In atrial fibrillation, the top part of the heart (atria) beats in an irregular pattern. This makes the heart unable to pump blood normally and effectively. The goal of treatment is to prevent blood clots from forming, control your heart rate, or restore your heartbeat to a normal rhythm. If this condition is not treated, it can cause serious problems, such as a weakened heart muscle (cardiomyopathy) or a stroke. What are the causes? This condition is often caused by medical conditions that damage the heart's electrical system. These include:  High blood pressure (hypertension). This is the most common cause.  Certain heart problems or conditions, such as heart failure, coronary artery disease, heart valve problems, or heart surgery.  Diabetes.  Overactive thyroid (hyperthyroidism).  Obesity.  Chronic kidney disease. In some cases, the cause of this condition is not known. What increases the risk? This condition is more likely to develop in:  Older people.  People who smoke.  Athletes who do endurance exercise.  People who have a family history of atrial fibrillation.  Men.  People who use drugs.  People who drink a lot of alcohol.  People who have lung conditions, such as emphysema, pneumonia, or COPD.  People who have obstructive sleep apnea. What are the signs or symptoms? Symptoms of this condition include:  A feeling that your heart is racing or beating irregularly.  Discomfort or pain in your chest.  Shortness of breath.  Sudden light-headedness or weakness.  Tiring easily during exercise or activity.  Fatigue.  Syncope (fainting).  Sweating. In some cases, there are no symptoms. How is this diagnosed? Your health care provider may detect atrial fibrillation when taking your pulse. If detected, this condition may be diagnosed with:  An electrocardiogram  (ECG) to check electrical signals of the heart.  An ambulatory cardiac monitor to record your heart's activity for a few days.  A transthoracic echocardiogram (TTE) to create pictures of your heart.  A transesophageal echocardiogram (TEE) to create even closer pictures of your heart.  A stress test to check your blood supply while you exercise.  Imaging tests, such as a CT scan or chest X-Abdulahi.  Blood tests. How is this treated? Treatment depends on underlying conditions and how you feel when you experience atrial fibrillation. This condition may be treated with:  Medicines to prevent blood clots or to treat heart rate or heart rhythm problems.  Electrical cardioversion to reset the heart's rhythm.  A pacemaker to correct abnormal heart rhythm.  Ablation to remove the heart tissue that sends abnormal signals.  Left atrial appendage closure to seal the area where blood clots can form. In some cases, underlying conditions will be treated. Follow these instructions at home: Medicines  Take over-the counter and prescription medicines only as told by your health care provider.  Do not take any new medicines without talking to your health care provider.  If you are taking blood thinners: ? Talk with your health care provider before you take any medicines that contain aspirin or NSAIDs, such as ibuprofen. These medicines increase your risk for dangerous bleeding. ? Take your medicine exactly as told, at the same time every day. ? Avoid activities that could cause injury or bruising, and follow instructions about how to prevent falls. ? Wear a medical alert bracelet or carry a card that lists what medicines you take. Lifestyle  Do not use any products that contain nicotine or  tobacco, such as cigarettes, e-cigarettes, and chewing tobacco. If you need help quitting, ask your health care provider.  Eat heart-healthy foods. Talk with a dietitian to make an eating plan that is right for  you.  Exercise regularly as told by your health care provider.  Do not drink alcohol.  Lose weight if you are overweight.  Do not use drugs, including cannabis.      General instructions  If you have obstructive sleep apnea, manage your condition as told by your health care provider.  Do not use diet pills unless your health care provider approves. Diet pills can make heart problems worse.  Keep all follow-up visits as told by your health care provider. This is important. Contact a health care provider if you:  Notice a change in the rate, rhythm, or strength of your heartbeat.  Are taking a blood thinner and you notice more bruising.  Tire more easily when you exercise or do heavy work.  Have a sudden change in weight. Get help right away if you have:  Chest pain, abdominal pain, sweating, or weakness.  Trouble breathing.  Side effects of blood thinners, such as blood in your vomit, stool, or urine, or bleeding that cannot stop.  Any symptoms of a stroke. "BE FAST" is an easy way to remember the main warning signs of a stroke: ? B - Balance. Signs are dizziness, sudden trouble walking, or loss of balance. ? E - Eyes. Signs are trouble seeing or a sudden change in vision. ? F - Face. Signs are sudden weakness or numbness of the face, or the face or eyelid drooping on one side. ? A - Arms. Signs are weakness or numbness in an arm. This happens suddenly and usually on one side of the body. ? S - Speech. Signs are sudden trouble speaking, slurred speech, or trouble understanding what people say. ? T - Time. Time to call emergency services. Write down what time symptoms started.  Other signs of a stroke, such as: ? A sudden, severe headache with no known cause. ? Nausea or vomiting. ? Seizure. These symptoms may represent a serious problem that is an emergency. Do not wait to see if the symptoms will go away. Get medical help right away. Call your local emergency services  (911 in the [place] Do not drive yourself to the hospital.   Summary  Atrial fibrillation is a type of irregular or rapid heartbeat (arrhythmia).  Symptoms include a feeling that your heart is beating fast or irregularly.  You may be given medicines to prevent blood clots or to treat heart rate or heart rhythm problems.  Get help right away if you have signs or symptoms of a stroke.  Get help right away if you cannot catch your breath or have chest pain or pressure. This information is not intended to replace advice given to you by your health care provider. Make sure you discuss any questions you have with your health care provider. Document Revised: 10/26/2018 Document Reviewed: 10/26/2018 Else[employer] Patient Education  [employer].

## 2020-07-18 NOTE — Telephone Encounter (Signed)
This was faxed back today with all info . Folder in [place] office.

## 2020-07-23 ENCOUNTER — Other Ambulatory Visit: Payer: Self-pay | Admitting: Family Medicine

## 2020-07-23 DIAGNOSIS — R7989 Other specified abnormal findings of blood chemistry: Secondary | ICD-10-CM

## 2020-07-24 DIAGNOSIS — E559 Vitamin D deficiency, unspecified: Secondary | ICD-10-CM | POA: Diagnosis not present

## 2020-07-24 DIAGNOSIS — I129 Hypertensive chronic kidney disease with stage 1 through stage 4 chronic kidney disease, or unspecified chronic kidney disease: Secondary | ICD-10-CM | POA: Diagnosis not present

## 2020-07-24 DIAGNOSIS — E1129 Type 2 diabetes mellitus with other diabetic kidney complication: Secondary | ICD-10-CM | POA: Diagnosis not present

## 2020-07-24 DIAGNOSIS — E1122 Type 2 diabetes mellitus with diabetic chronic kidney disease: Secondary | ICD-10-CM | POA: Diagnosis not present

## 2020-07-24 DIAGNOSIS — N189 Chronic kidney disease, unspecified: Secondary | ICD-10-CM | POA: Diagnosis not present

## 2020-07-24 DIAGNOSIS — I5032 Chronic diastolic (congestive) heart failure: Secondary | ICD-10-CM | POA: Diagnosis not present

## 2020-07-24 DIAGNOSIS — R809 Proteinuria, unspecified: Secondary | ICD-10-CM | POA: Diagnosis not present

## 2020-07-26 ENCOUNTER — Other Ambulatory Visit: Payer: Self-pay | Admitting: Family

## 2020-07-28 ENCOUNTER — Other Ambulatory Visit: Payer: Self-pay | Admitting: Family

## 2020-07-28 DIAGNOSIS — E1142 Type 2 diabetes mellitus with diabetic polyneuropathy: Secondary | ICD-10-CM

## 2020-07-28 DIAGNOSIS — F331 Major depressive disorder, recurrent, moderate: Secondary | ICD-10-CM

## 2020-07-28 DIAGNOSIS — E1159 Type 2 diabetes mellitus with other circulatory complications: Secondary | ICD-10-CM

## 2020-08-16 NOTE — Progress Notes (Signed)
Cardiology Office Note    Date:  08/26/2020   ID:  [name], DOB Jun 06, 1950, MRN [id number]  PCP:  [name], FNP  Cardiologist: Jenkins Rouge, MD    No chief complaint on file.   History of Present Illness:    70 y.o. with history of PAF, PAD, HTN, HLD IDDM prior CVA. Extensive history of CAD with most recent cath 05/24/20 resulting in stents to circumflex/OM for in stent restenosis Chronic CTO RCA with good left to right collaterals Occluded D2 and distal circumflex with Left to left collaterals EF 50-55% by TTE June 2021 Has had PAF with conversion with amiodarone ? Some dizziness with medicine and dose decreased by primary 07/18/20 to 100 mg daily Also noted BP to be a bit low with postural dizziness Of note he is on lasix and [place]. He also takes imdur 30 mg and Toprol 50 mg daily   He sees Dr Lovena Le for his PAF and had ILR implanted for cryptogenic stroke 07/18/20 seen by primary [name] and felt terrible She did ECG but failed to mention he was back in afib His ARB seems to drop his BP too low and he was started on Farxiga while still taking lasix daily   Labs 07/18/20 showed BUN 23 Cr up a bit 1.74 K 4.4 Hct 36 PLT 314 TSH normal    He has significant back problems and may need surgery  Or injection I told him the earlierst This could be done is July given recent stent and need for plavix   PVD followed by [name] St[facility] to right SFA 04/17/19 unable to recanalize left needs f/u duplex And ABI's Does have some skin breakdown on shin and left foot     Past Medical History:  Diagnosis Date  . Anxiety   . Arthritis   . CAD (coronary artery disease)    a. 2010: DES to CTO of RCA. EF 55% b. 07/2016: cath showing total occlusion within previously placed RCA stent (collaterals present), severe stenosis along LCx and OM1 (treated with 2 overlapping DES). c. repeat cath in 01/2018 showing patent stents along LCx and OM with CTO of D2, CTO of distal LCx, and CTO of RCA with  collaterals present overall unchanged since 2018 with medical management recom  . Cellulitis and abscess rt groin   . Complication of anesthesia    " I woke up during a colonoscopy "     . Depression   . Diabetes mellitus   . Disorders of iron metabolism   . GERD (gastroesophageal reflux disease)   . Hyperlipidemia   . Hypertension   . Low serum testosterone level   . Medically noncompliant   . Myocardial infarction [facility])     Past Surgical History:  Procedure Laterality Date  . ABDOMINAL AORTOGRAM W/LOWER EXTREMITY N/A 03/21/2019   Procedure: ABDOMINAL AORTOGRAM W/LOWER EXTREMITY;  Surgeon: [name], MD;  Location: [place] [facility];  Service: Cardiovascular;  Laterality: N/A;  . BACK SURGERY  2015   ACDF by Dr. [name]  . COLONOSCOPY N/A 10/01/2014   Dr. [name]: multiple tubular adenomas removed, colonic diverticulosis, redundant colon. next tcs advised for 09/2017. PATIENT NEEDS PROPOFOL FOR FAILED CONSCIOUS SEDATION  . CORONARY STENT INTERVENTION N/A 07/30/2016   Procedure: Coronary Stent Intervention;  Surgeon: [name], MD;  Location: [place] [facility];  Service: Cardiovascular;  Laterality: N/A;  . CORONARY STENT PLACEMENT  2000   By Dr. Olevia Perches  . EP IMPLANTABLE DEVICE  N/A 05/25/2016   Procedure: Loop Recorder Insertion;  Surgeon: [name], MD;  Location: [place] [facility];  Service: Cardiovascular;  Laterality: N/A;  . ESOPHAGOGASTRODUODENOSCOPY     esophagus stretched remotely at [facility]  . ESOPHAGOGASTRODUODENOSCOPY N/A 10/01/2014   Dr. [name]: patchy mottling/erythema and minimal polypoid appearance of gastric mucosa. bx with mild inlammation but no H.pylori  . HERNIA REPAIR  1517   umbilical  . LEFT HEART CATH AND CORONARY ANGIOGRAPHY N/A 07/30/2016   Procedure: Left Heart Cath and Coronary Angiography;  Surgeon: [name], MD;  Location: [place] [facility];  Service: Cardiovascular;  Laterality: N/A;  . LEFT HEART CATH AND CORONARY ANGIOGRAPHY N/A  01/19/2018   Procedure: LEFT HEART CATH AND CORONARY ANGIOGRAPHY;  Surgeon: [name], MD;  Location: [place] [facility];  Service: Cardiovascular;  Laterality: N/A;  . LEFT HEART CATH AND CORONARY ANGIOGRAPHY N/A 05/24/2020   Procedure: LEFT HEART CATH AND CORONARY ANGIOGRAPHY;  Surgeon: Burnell Blanks, MD;  Location: [place] [facility];  Service: Cardiovascular;  Laterality: N/A;  . LESION REMOVAL     Lip and hand   . LOWER EXTREMITY ANGIOGRAPHY N/A 04/18/2019   Procedure: LOWER EXTREMITY ANGIOGRAPHY;  Surgeon: [name], MD;  Location: [place] [facility];  Service: Cardiovascular;  Laterality: N/A;  . NECK SURGERY    . PERIPHERAL VASCULAR BALLOON ANGIOPLASTY  04/18/2019   Procedure: PERIPHERAL VASCULAR BALLOON ANGIOPLASTY;  Surgeon: [name], MD;  Location: [place] [facility];  Service: Cardiovascular;;  . PERIPHERAL VASCULAR INTERVENTION Right 03/21/2019   Procedure: PERIPHERAL VASCULAR INTERVENTION;  Surgeon: [name], MD;  Location: [place] [facility];  Service: Cardiovascular;  Laterality: Right;  superficial femoral    Current Medications: Outpatient Medications Prior to Visit  Medication Sig Dispense Refill  . albuterol (VENTOLIN HFA) 108 (90 Base) MCG/ACT inhaler Inhale 2 puffs into the lungs every 6 (six) hours as needed for wheezing or shortness of breath. 8.5 g 3  . AMBULATORY NON FORMULARY MEDICATION Take 180 mg by mouth daily at 12 noon. Medication Name: Clear Study Drug-Bempodic acid versus placebo    . apixaban (ELIQUIS) 5 MG TABS tablet Take 1 tablet (5 mg total) by mouth 2 (two) times daily. 180 tablet 1  . Chlorphen-Phenyleph-ASA (ALKA-SELTZER PLUS COLD PO) Take 2 tablets by mouth daily as needed (allergies).    . clopidogrel (PLAVIX) 75 MG tablet Take 1 tablet by mouth once daily 90 tablet 0  . DULoxetine (CYMBALTA) 30 MG capsule Take 1 capsule by mouth once daily 90 capsule 0  . FARXIGA 5 MG TABS tablet TAKE 1 TABLET BY MOUTH ONCE DAILY  BEFORE BREAKFAST 30 tablet 1  . fenofibrate 160 MG tablet TAKE 1 TABLET BY MOUTH ONCE DAILY FOR CHOLESTEROL AND TRIGLYCERIDES 90 tablet 4  . furosemide (LASIX) 20 MG tablet TAKE 1 TABLET BY MOUTH ONCE DAILY AS NEEDED FOR EDEMA 90 tablet 0  . gabapentin (NEURONTIN) 300 MG capsule Take 3 capsules (900 mg total) by mouth 3 (three) times daily. (Patient taking differently: Take 1,200 mg by mouth 2 (two) times daily.) 810 capsule 3  . HUMALOG KWIKPEN 100 UNIT/ML KwikPen INJECT 5 TO 20 UNITS SUBCUTANEOUSLY THREE TIMES DAILY WITH MEALS 15 mL 0  . HYDROcodone-acetaminophen (NORCO) 10-325 MG tablet Take 1 tablet by mouth 3 (three) times daily as needed.    . insulin NPH-regular Human (70-30) 100 UNIT/ML injection Inject into the skin.    Marland Kitchen insulin NPH-regular Human (70-30) 100  UNIT/ML injection Inject 80 Units into the skin 2 (two) times daily with a meal.    . isosorbide mononitrate (IMDUR) 30 MG 24 hr tablet Take 1 tablet (30 mg total) by mouth daily. 90 tablet 4  . linaclotide (LINZESS) 72 MCG capsule Take 1 capsule (72 mcg total) by mouth daily as needed (constipation). 90 capsule 3  . Menthol, Topical Analgesic, (BLUE-EMU MAXIMUM STRENGTH EX) Apply 1 application topically daily as needed (back pain).    . metoprolol succinate (TOPROL-XL) 50 MG 24 hr tablet Take 1 tablet by mouth once daily 90 tablet 3  . metoprolol tartrate (LOPRESSOR) 50 MG tablet Take 50 mg by mouth daily as needed (a-fib).    . nitroGLYCERIN (NITROSTAT) 0.4 MG SL tablet DISSOLVE ONE TABLET UNDER THE TONGUE EVERY 5 MINUTES AS NEEDED FOR CHEST PAIN.  DO NOT EXCEED A TOTAL OF 3 DOSES IN 15 MINUTES 25 tablet 0  . pantoprazole (PROTONIX) 40 MG tablet Take 1 tablet by mouth once daily 90 tablet 0  . Semaglutide, 1 MG/DOSE, (OZEMPIC, 1 MG/DOSE,) 2 MG/1.5ML SOPN Inject 0.75 mLs (1 mg total) into the skin once a week. 12 mL 4  . triamcinolone (NASACORT) 55 MCG/ACT AERO nasal inhaler Place 1 spray into the nose as needed (allergies).    .  vitamin B-12 (CYANOCOBALAMIN) 1000 MCG tablet Take 1,000 mcg by mouth daily.    Marland Kitchen telmisartan (MICARDIS) 20 MG tablet Take 1 tablet by mouth once daily 90 tablet 0  . amiodarone (PACERONE) 100 MG tablet Take 1 tablet (100 mg total) by mouth daily. (Patient not taking: No sig reported) 90 tablet 1  . insulin degludec (TRESIBA FLEXTOUCH) 200 UNIT/ML FlexTouch Pen Inject 80 Units into the skin in the morning and at bedtime. (Patient not taking: Reported on 08/26/2020) 15 mL 2   No facility-administered medications prior to visit.     Allergies:   Shellfish allergy, Sulfa antibiotics, Ace inhibitors, Invokana [canagliflozin], Lisinopril, Metformin and related, Pravastatin sodium, Milk-related compounds, Crestor [rosuvastatin], Fenofibrate, Horse-derived products, Iodine, Lexapro [escitalopram oxalate], Lipitor [atorvastatin], Livalo [pitavastatin], Repatha [evolocumab], and Tape   Social History   Socioeconomic History  . Marital status: Married    Spouse name: Not on file  . Number of children: 2  . Years of education: 66  . Highest education level: 12th grade  Occupational History  . Occupation: retired  Tobacco Use  . Smoking status: Former Smoker    Packs/day: 0.25    Years: 51.00    Pack years: 12.75    Types: Cigarettes    Quit date: 08/14/2016    Years since quitting: 4.0  . Smokeless tobacco: Never Used  . Tobacco comment: smokes  a pack a week  Vaping Use  . Vaping Use: Never used  Substance and Sexual Activity  . Alcohol use: No    Alcohol/week: 0.0 standard drinks  . Drug use: No  . Sexual activity: Yes  Other Topics Concern  . Not on file  Social History Narrative   Lives with his wife.  Retired.  Unable to afford expensive medicines.        Caffeine: 1 cup of 1/2 caff coffee in AM, drinks more if at a restaurant    Social Determinants of Health   Financial Resource Strain: Not on file  Food Insecurity: Not on file  Transportation Needs: Not on file  Physical  Activity: Not on file  Stress: Not on file  Social Connections: Not on file     Family History:  The patient's family history includes Alzheimer's disease in his mother; Arthritis in his father and mother; Arthritis/Rheumatoid in his sister; Dementia in his maternal aunt and maternal uncle; Depression in his sister; Diabetes in his father and sister; Headache in his mother; Heart disease in his father and maternal uncle; Hyperlipidemia in his mother and sister; Hypertension in his mother and sister; Lung cancer in his mother; Stomach cancer in his paternal uncle; Stroke in his father and mother; Valvular heart disease in his father.   Review of Systems:   Please see the history of present illness.     General:  No chills, fever, night sweats or weight changes.  Cardiovascular:  No chest pain, dyspnea on exertion, edema, orthopnea,  paroxysmal nocturnal dyspnea. Positive for palpitations (now resolved).  Dermatological: No rash, lesions/masses Respiratory: No cough, dyspnea Urologic: No hematuria, dysuria Abdominal:   No nausea, vomiting, diarrhea, bright red blood per rectum, melena, or hematemesis Neurologic:  No visual changes, wkns, changes in mental status. All other systems reviewed and are otherwise negative except as noted above.   Physical Exam:    VS:  BP 124/80   Pulse 75   Wt 133.4 kg   SpO2 95%   BMI 37.75 kg/m    Affect appropriate Chronically ill male  HEENT: normal Neck supple with no adenopathy JVP normal no bruits no thyromegaly Lungs clear with no wheezing and good diaphragmatic motion Heart:  S1/S2 no murmur, no rub, gallop or click PMI normal  ILR under left pectoral area  Abdomen: benighn, BS positve, no tenderness, no AAA no bruit.  No HSM or HJR Distal pulses intact with no bruits No edema Neuro non-focal Skin warm and dry No muscular weakness   Wt Readings from Last 3 Encounters:  08/26/20 133.4 kg  08/19/20 132.8 kg  07/18/20 131.8 kg      Studies/Labs Reviewed:   EKG:  afib rate 91 07/18/20  Recent Labs: 06/06/2020: B Natriuretic Peptide 218.2 06/20/2020: TSH 3.690 08/19/2020: ALT 20; BUN 25; Creatinine, Ser 1.41; Hemoglobin 14.3; Platelets 370; Potassium 4.9; Sodium 140   Lipid Panel    Component Value Date/Time   CHOL 172 05/23/2020 0051   CHOL 232 (H) 02/14/2020 1135   TRIG 378 (H) 05/23/2020 0051   HDL 21 (L) 05/23/2020 0051   HDL 15 (L) 02/14/2020 1135   CHOLHDL 8.2 05/23/2020 0051   VLDL 76 (H) 05/23/2020 0051   LDLCALC 75 05/23/2020 0051   LDLCALC 108 (H) 02/14/2020 1135   LDLDIRECT 172 (H) 11/08/2014 0908    Additional studies/ records that were reviewed today include:   Echocardiogram: 10/2019 IMPRESSIONS    1. Inferior basal akinesis . Left ventricular ejection fraction, by  estimation, is 50 to 55%. The left ventricle has low normal function. The  left ventricle demonstrates regional wall motion abnormalities (see  scoring diagram/findings for description).  There is mild left ventricular hypertrophy. Left ventricular diastolic  parameters were normal.  2. Right ventricular systolic function is normal. The right ventricular  size is normal.  3. The mitral valve is normal in structure. Trivial mitral valve  regurgitation. No evidence of mitral stenosis.  4. Calcification of the non coronary cusp . The aortic valve is  tricuspid. Aortic valve regurgitation is not visualized. Mild to moderate  aortic valve sclerosis/calcification is present, without any evidence of  aortic stenosis.  5. The inferior vena cava is dilated in size with >50% respiratory  variability, suggesting right atrial pressure of 8 mmHg.   Cardiac  Catheterization: 05/24/2020  Prox LAD lesion is 40% stenosed.  Ost 1st Diag lesion is 40% stenosed.  Ost 2nd Diag to 2nd Diag lesion is 100% stenosed.  Mid Cx to Dist Cx lesion is 100% stenosed.  Prox RCA to Mid RCA lesion is 90% stenosed.  Prox RCA lesion is 99%  stenosed.  Ost 1st Mrg to 1st Mrg lesion is 80% stenosed.  A drug-eluting stent was successfully placed using a STENT RESOLUTE ONYX [id number].  Post intervention, there is a 0% residual stenosis.  Prox Cx lesion is 95% stenosed.  A drug-eluting stent was successfully placed using a STENT RESOLUTE ONYX 3.0X22.  Post intervention, there is a 0% residual stenosis.   1. Moderate mid LAD stenosis unchanged. Chronic occlusion Diagonal 1. Severe disease small diagonal 2.  2. Severe restenosis Circumflex/OM stents.  3. Successful PTCA/DES x 2 with overlapping Resolute Onyx DES in the Circumflex/OM 4. Chronic occlusion mid RCA in old stented segment. The distal vessel fills briskly from left to right collaterals.   Recommendations: Continue Plavix. Resume anticoagulation tomorrow. Will continue ASA 81 mg daily until he is fully anti-coagulated then OK to stop ASA.     Assessment:    1. PVD (peripheral vascular disease) ([place])      Plan:   In order of problems listed above:  1. Paroxysmal Atrial Fibrillation - Amiodarone dose decreased by GT 06/05/20 and by primary to 100 mg daily 07/18/20 [name] note from 07/18/20 does not mention that ECG done that day patient back in afib. He has been maintained on eliquis  He stopped amiodarone all together him self due to visual changes Today he appears to be back in NSR will have him f/u with GT consider monitor or alternative RX   2. CAD - DES to RCA 2010 with f/u cath 2018 showing CTO RCA with collaterals.  Cath 05/24/20 with new stents placed to to circumflex and OM1 with occluded D2 and continued left to right collaterals to distal RCA and left to left collaterals to distal circumflex Do not stop plavix for 6 months post intervention    3. PAD - Followed by Vascular Surgery and scheduled for repeat imaging later this month. Remains on Plavix. 40-59% RICA stenosis by duplex 01/11/20  Will order duplex and ABI's f/u with VVS   4. HTN -  actually low now d/c ARB    5. HLD - LDL had improved to 75 when checked earlier this month. He has been intolerant to statins and Repatha. Continues to participate in the CLEAR trial.   6. Properative:  - Lumbar spine surgery or injection postpone till July if possible given recent coronary intervention  And need for uninterrupted plavix  F/U GT next available F/U [name] F/U me 3 months    Signed, Jenkins Rouge, MD  08/26/2020 3:52 PM    [facility] [address] [place], [place] [address] Phone: [phone] Fax: [phone]

## 2020-08-19 ENCOUNTER — Other Ambulatory Visit: Payer: Self-pay

## 2020-08-19 ENCOUNTER — Encounter: Payer: Self-pay | Admitting: Family

## 2020-08-19 ENCOUNTER — Ambulatory Visit (INDEPENDENT_AMBULATORY_CARE_PROVIDER_SITE_OTHER): Payer: Medicare Other | Admitting: Family

## 2020-08-19 VITALS — BP 138/69 | HR 81 | Temp 97.0°F | Ht 74.0 in | Wt 292.8 lb

## 2020-08-19 DIAGNOSIS — E0841 Diabetes mellitus due to underlying condition with diabetic mononeuropathy: Secondary | ICD-10-CM | POA: Diagnosis not present

## 2020-08-19 DIAGNOSIS — E785 Hyperlipidemia, unspecified: Secondary | ICD-10-CM

## 2020-08-19 DIAGNOSIS — K219 Gastro-esophageal reflux disease without esophagitis: Secondary | ICD-10-CM

## 2020-08-19 DIAGNOSIS — Z794 Long term (current) use of insulin: Secondary | ICD-10-CM

## 2020-08-19 DIAGNOSIS — F331 Major depressive disorder, recurrent, moderate: Secondary | ICD-10-CM

## 2020-08-19 DIAGNOSIS — I509 Heart failure, unspecified: Secondary | ICD-10-CM | POA: Diagnosis not present

## 2020-08-19 DIAGNOSIS — I480 Paroxysmal atrial fibrillation: Secondary | ICD-10-CM

## 2020-08-19 DIAGNOSIS — I152 Hypertension secondary to endocrine disorders: Secondary | ICD-10-CM | POA: Diagnosis not present

## 2020-08-19 DIAGNOSIS — I2510 Atherosclerotic heart disease of native coronary artery without angina pectoris: Secondary | ICD-10-CM

## 2020-08-19 DIAGNOSIS — Z9861 Coronary angioplasty status: Secondary | ICD-10-CM

## 2020-08-19 DIAGNOSIS — E1159 Type 2 diabetes mellitus with other circulatory complications: Secondary | ICD-10-CM

## 2020-08-19 DIAGNOSIS — E1142 Type 2 diabetes mellitus with diabetic polyneuropathy: Secondary | ICD-10-CM

## 2020-08-19 DIAGNOSIS — E11621 Type 2 diabetes mellitus with foot ulcer: Secondary | ICD-10-CM | POA: Diagnosis not present

## 2020-08-19 DIAGNOSIS — F419 Anxiety disorder, unspecified: Secondary | ICD-10-CM

## 2020-08-19 DIAGNOSIS — E1169 Type 2 diabetes mellitus with other specified complication: Secondary | ICD-10-CM

## 2020-08-19 DIAGNOSIS — L97519 Non-pressure chronic ulcer of other part of right foot with unspecified severity: Secondary | ICD-10-CM

## 2020-08-19 LAB — CMP14+EGFR
ALT: 20 IU/L (ref 0–44)
AST: 22 IU/L (ref 0–40)
Albumin/Globulin Ratio: 1.5 (ref 1.2–2.2)
Albumin: 4.6 g/dL (ref 3.8–4.8)
Alkaline Phosphatase: 70 IU/L (ref 44–121)
BUN/Creatinine Ratio: 18 (ref 10–24)
BUN: 25 mg/dL (ref 8–27)
Bilirubin Total: 0.6 mg/dL (ref 0.0–1.2)
CO2: 26 mmol/L (ref 20–29)
Calcium: 10.6 mg/dL — ABNORMAL HIGH (ref 8.6–10.2)
Chloride: 97 mmol/L (ref 96–106)
Creatinine, Ser: 1.41 mg/dL — ABNORMAL HIGH (ref 0.76–1.27)
Globulin, Total: 3 g/dL (ref 1.5–4.5)
Glucose: 154 mg/dL — ABNORMAL HIGH (ref 65–99)
Potassium: 4.9 mmol/L (ref 3.5–5.2)
Sodium: 140 mmol/L (ref 134–144)
Total Protein: 7.6 g/dL (ref 6.0–8.5)
eGFR: 54 mL/min/{1.73_m2} — ABNORMAL LOW (ref >59)

## 2020-08-19 LAB — CBC WITH DIFFERENTIAL/PLATELET
Basophils Absolute: 0.1 10*3/uL (ref 0.0–0.2)
Basos: 1 %
EOS (ABSOLUTE): 0.2 10*3/uL (ref 0.0–0.4)
Eos: 2 %
Hematocrit: 43.6 % (ref 37.5–51.0)
Hemoglobin: 14.3 g/dL (ref 13.0–17.7)
Immature Grans (Abs): 0.1 10*3/uL (ref 0.0–0.1)
Immature Granulocytes: 1 %
Lymphocytes Absolute: 1.8 10*3/uL (ref 0.7–3.1)
Lymphs: 22 %
MCH: 27.2 pg (ref 26.6–33.0)
MCHC: 32.8 g/dL (ref 31.5–35.7)
MCV: 83 fL (ref 79–97)
Monocytes Absolute: 0.8 10*3/uL (ref 0.1–0.9)
Monocytes: 9 %
Neutrophils Absolute: 5.5 10*3/uL (ref 1.4–7.0)
Neutrophils: 65 %
Platelets: 370 10*3/uL (ref 150–450)
RBC: 5.26 x10E6/uL (ref 4.14–5.80)
RDW: 14.2 % (ref 11.6–15.4)
WBC: 8.4 10*3/uL (ref 3.4–10.8)

## 2020-08-19 LAB — BAYER DCA HB A1C WAIVED: HB A1C (BAYER DCA - WAIVED): 7 % — ABNORMAL HIGH (ref <7.0)

## 2020-08-19 MED ORDER — TRESIBA FLEXTOUCH 200 UNIT/ML ~~LOC~~ SOPN: 80.0000 [IU] | PEN_INJECTOR | Freq: Two times a day (BID) | SUBCUTANEOUS | 2 refills | Status: DC

## 2020-08-19 MED ORDER — GABAPENTIN 300 MG PO CAPS: 900.0000 mg | ORAL_CAPSULE | Freq: Three times a day (TID) | ORAL | 3 refills | Status: DC

## 2020-08-19 NOTE — Progress Notes (Signed)
Subjective:    Patient ID: [name] [name], male    DOB: 1950-11-29, 70 y.o.   MRN: [id number]  Chief Complaint  Patient presents with  . Diabetes  . Hypertension   PT presents to the office today for chronic follow up. PT had NSTEMI on 07/30/16  and 05/23/20 and stent placed. Pt is followed by Cardiologistsevery 4 months. Pt currently in a clinical trial for hyperlipidemia.He is followed by Ortho for osteoarthritis and is currently getting injections in bilateral knees.Pt is followed by Pain Management every 2-3 months.Followed by Nephrologists every 3 months for CKD.  He is followed by Vascular and had stent placed in his right leg.  He states since our last visit he stopped his amiodarone 100 mg completely. He has not seen his Cardiologists and has a follow up with them in the next 2 weeks. He reports he feels so much better since stopping and thought he was going to die when he was taking it. He states he will not restart this.  Diabetes He presents for his follow-up diabetic visit. He has type 2 diabetes mellitus. His disease course has been stable. Hypoglycemia symptoms include nervousness/anxiousness. Associated symptoms include fatigue and weakness. Pertinent negatives for diabetes include no blurred vision and no foot paresthesias. There are no hypoglycemic complications. Symptoms are stable. Diabetic complications include heart disease, nephropathy and peripheral neuropathy. Risk factors for coronary artery disease include dyslipidemia, diabetes mellitus, male sex, hypertension and sedentary lifestyle. He is following a generally unhealthy diet. His overall blood glucose range is 130-140 mg/dl. An ACE inhibitor/angiotensin II receptor blocker is being taken.  Hypertension This is a chronic problem. The current episode started more than 1 year ago. The problem has been resolved since onset. The problem is controlled. Associated symptoms include anxiety and malaise/fatigue. Pertinent  negatives include no blurred vision, peripheral edema or shortness of breath. Risk factors for coronary artery disease include dyslipidemia, diabetes mellitus, male gender and sedentary lifestyle. The current treatment provides moderate improvement. Hypertensive end-organ damage includes kidney disease and CAD/MI.  Hyperlipidemia This is a chronic problem. The current episode started more than 1 year ago. The problem is uncontrolled. Pertinent negatives include no shortness of breath. The current treatment provides moderate improvement of lipids. Risk factors for coronary artery disease include hypertension, a sedentary lifestyle and dyslipidemia.  Anxiety Presents for follow-up visit. Symptoms include depressed mood, irritability, nervous/anxious behavior and restlessness. Patient reports no shortness of breath. The quality of sleep is good.    Depression        This is a chronic problem.  The current episode started more than 1 year ago.   The onset quality is gradual.   The problem occurs intermittently.  Associated symptoms include fatigue, helplessness, hopelessness, irritable and restlessness.  Past medical history includes anxiety.    Diabetic Neuropathy Pt states his pain is a 5-6 out 10. He takes gabapentin 600 mg TID that helps.    Review of Systems  Constitutional: Positive for fatigue, irritability and malaise/fatigue.  Eyes: Negative for blurred vision.  Respiratory: Negative for shortness of breath.   Neurological: Positive for weakness.  Psychiatric/Behavioral: Positive for depression. The patient is nervous/anxious.   All other systems reviewed and are negative.      Objective:   Physical Exam Vitals reviewed.  Constitutional:      General: He is irritable. He is not in acute distress.    Appearance: He is well-developed. He is obese.  HENT:     Head:  Normocephalic.     Right Ear: Tympanic membrane normal.     Left Ear: Tympanic membrane normal.  Eyes:     General:         Right eye: No discharge.        Left eye: No discharge.     Pupils: Pupils are equal, round, and reactive to light.  Neck:     Thyroid: No thyromegaly.  Cardiovascular:     Rate and Rhythm: Normal rate and regular rhythm.     Heart sounds: Murmur heard.    Pulmonary:     Effort: Pulmonary effort is normal. No respiratory distress.     Breath sounds: Normal breath sounds. No wheezing.  Abdominal:     General: Bowel sounds are normal. There is no distension.     Palpations: Abdomen is soft.     Tenderness: There is no abdominal tenderness.  Musculoskeletal:        General: No tenderness. Normal range of motion.     Cervical back: Normal range of motion and neck supple.  Skin:    General: Skin is warm and dry.     Findings: No erythema or rash.  Neurological:     Mental Status: He is alert and oriented to person, place, and time.     Cranial Nerves: No cranial nerve deficit.     Deep Tendon Reflexes: Reflexes are normal and symmetric.  Psychiatric:        Behavior: Behavior normal.        Thought Content: Thought content normal.        Judgment: Judgment normal.          BP 138/69   Pulse 81   Temp (!) 97 F (36.1 C) (Temporal)   Ht $R'6\' 2"'xV$  (1.88 m)   Wt 292 lb 12.8 oz (132.8 kg)   SpO2 99%   BMI 37.59 kg/m   Assessment & Plan:  [name] [name] comes in today with chief complaint of Diabetes and Hypertension   Diagnosis and orders addressed:  1. Hypertension associated with diabetes ([place]) - CMP14+EGFR - CBC with Differential/Platelet  2. CAD S/P percutaneous coronary angioplasty - CMP14+EGFR - CBC with Differential/Platelet  3. Congestive heart failure, unspecified HF chronicity, unspecified heart failure type ([place] - CMP14+EGFR - CBC with Differential/Platelet  4. PAF (paroxysmal atrial fibrillation) ([facility]) - CMP14+EGFR - CBC with Differential/Platelet  5. Gastroesophageal reflux disease without esophagitis - CMP14+EGFR - CBC with  Differential/Platelet  6. Hyperlipidemia associated with type 2 diabetes mellitus ([facility])  - CMP14+EGFR - CBC with Differential/Platelet  7. Diabetic peripheral neuropathy associated with type 2 diabetes mellitus ([facility]) - CMP14+EGFR - CBC with Differential/Platelet  8. Diabetic mononeuropathy associated with diabetes mellitus due to underlying condition ([place]) - CMP14+EGFR - CBC with Differential/Platelet  9. Type 2 diabetes mellitus with diabetic polyneuropathy, with long-term current use of insulin ([facility]) - insulin degludec (TRESIBA FLEXTOUCH) 200 UNIT/ML FlexTouch Pen; Inject 80 Units into the skin in the morning and at bedtime.  Dispense: 15 mL; Refill: 2 - CMP14+EGFR - CBC with Differential/Platelet - Bayer DCA Hb A1c Waived  10. Moderate episode of recurrent major depressive disorder ([facility]) - CMP14+EGFR - CBC with Differential/Platelet  11. Morbid obesity ([place]) - CMP14+EGFR - CBC with Differential/Platelet  12. Anxiety - CMP14+EGFR - CBC with Differential/Platelet  13. Diabetic ulcer of toe of right foot associated with type 2 diabetes mellitus, unspecified ulcer stage ([place]) - Ambulatory referral to [facility] pending Health Maintenance reviewed Diet and  exercise encouraged  Follow up plan: 3 months and follow up with Cardiologists.    [name], FNP

## 2020-08-19 NOTE — Patient Instructions (Signed)
Diabetes Mellitus and Foot Care Foot care is an important part of your health, especially when you have diabetes. Diabetes may cause you to have problems because of poor blood flow (circulation) to your feet and legs, which can cause your skin to:  Become thinner and drier.  Break more easily.  Heal more slowly.  Peel and crack. You may also have nerve damage (neuropathy) in your legs and feet, causing decreased feeling in them. This means that you may not notice minor injuries to your feet that could lead to more serious problems. Noticing and addressing any potential problems early is the best way to prevent future foot problems. How to care for your feet Foot hygiene  Wash your feet daily with warm water and mild soap. Do not use hot water. Then, pat your feet and the areas between your toes until they are completely dry. Do not soak your feet as this can dry your skin.  Trim your toenails straight across. Do not dig under them or around the cuticle. File the edges of your nails with an emery board or nail file.  Apply a moisturizing lotion or petroleum jelly to the skin on your feet and to dry, brittle toenails. Use lotion that does not contain alcohol and is unscented. Do not apply lotion between your toes.   Shoes and socks  Wear clean socks or stockings every day. Make sure they are not too tight. Do not wear knee-high stockings since they may decrease blood flow to your legs.  Wear shoes that fit properly and have enough cushioning. Always look in your shoes before you put them on to be sure there are no objects inside.  To break in new shoes, wear them for just a few hours a day. This prevents injuries on your feet. Wounds, scrapes, corns, and calluses  Check your feet daily for blisters, cuts, bruises, sores, and redness. If you cannot see the bottom of your feet, use a mirror or ask someone for help.  Do not cut corns or calluses or try to remove them with medicine.  If you  find a minor scrape, cut, or break in the skin on your feet, keep it and the skin around it clean and dry. You may clean these areas with mild soap and water. Do not clean the area with peroxide, alcohol, or iodine.  If you have a wound, scrape, corn, or callus on your foot, look at it several times a day to make sure it is healing and not infected. Check for: ? Redness, swelling, or pain. ? Fluid or blood. ? Warmth. ? Pus or a bad smell.   General tips  Do not cross your legs. This may decrease blood flow to your feet.  Do not use heating pads or hot water bottles on your feet. They may burn your skin. If you have lost feeling in your feet or legs, you may not know this is happening until it is too late.  Protect your feet from hot and cold by wearing shoes, such as at the beach or on hot pavement.  Schedule a complete foot exam at least once a year (annually) or more often if you have foot problems. Report any cuts, sores, or bruises to your health care provider immediately. Where to find more information  American Diabetes Association: www.diabetes.org  Association of Diabetes Care & Education Specialists: www.diabeteseducator.org Contact a health care provider if:  You have a medical condition that increases your risk of infection and   you have any cuts, sores, or bruises on your feet.  You have an injury that is not healing.  You have redness on your legs or feet.  You feel burning or tingling in your legs or feet.  You have pain or cramps in your legs and feet.  Your legs or feet are numb.  Your feet always feel cold.  You have pain around any toenails. Get help right away if:  You have a wound, scrape, corn, or callus on your foot and: ? You have pain, swelling, or redness that gets worse. ? You have fluid or blood coming from the wound, scrape, corn, or callus. ? Your wound, scrape, corn, or callus feels warm to the touch. ? You have pus or a bad smell coming from  the wound, scrape, corn, or callus. ? You have a fever. ? You have a red line going up your leg. Summary  Check your feet every day for blisters, cuts, bruises, sores, and redness.  Apply a moisturizing lotion or petroleum jelly to the skin on your feet and to dry, brittle toenails.  Wear shoes that fit properly and have enough cushioning.  If you have foot problems, report any cuts, sores, or bruises to your health care provider immediately.  Schedule a complete foot exam at least once a year (annually) or more often if you have foot problems. This information is not intended to replace advice given to you by your health care provider. Make sure you discuss any questions you have with your health care provider. Document Revised: 11/23/2019 Document Reviewed: 11/23/2019 Elsevier Patient Education  2021 Elsevier Inc.  

## 2020-08-20 ENCOUNTER — Telehealth: Payer: Self-pay | Admitting: Family

## 2020-08-22 DIAGNOSIS — L97511 Non-pressure chronic ulcer of other part of right foot limited to breakdown of skin: Secondary | ICD-10-CM | POA: Diagnosis not present

## 2020-08-23 ENCOUNTER — Encounter: Payer: Self-pay | Admitting: Family Medicine

## 2020-08-25 ENCOUNTER — Other Ambulatory Visit: Payer: Self-pay | Admitting: Surgery

## 2020-08-26 ENCOUNTER — Encounter: Payer: Self-pay | Admitting: Cardiovascular Disease

## 2020-08-26 ENCOUNTER — Ambulatory Visit: Payer: Medicare Other | Admitting: Cardiovascular Disease

## 2020-08-26 ENCOUNTER — Other Ambulatory Visit: Payer: Self-pay

## 2020-08-26 VITALS — BP 124/80 | HR 75 | Wt 294.0 lb

## 2020-08-26 DIAGNOSIS — I739 Peripheral vascular disease, unspecified: Secondary | ICD-10-CM | POA: Diagnosis not present

## 2020-08-26 DIAGNOSIS — I2510 Atherosclerotic heart disease of native coronary artery without angina pectoris: Secondary | ICD-10-CM | POA: Diagnosis not present

## 2020-08-26 DIAGNOSIS — Z01810 Encounter for preprocedural cardiovascular examination: Secondary | ICD-10-CM | POA: Diagnosis not present

## 2020-08-26 DIAGNOSIS — I480 Paroxysmal atrial fibrillation: Secondary | ICD-10-CM

## 2020-08-26 DIAGNOSIS — I10 Essential (primary) hypertension: Secondary | ICD-10-CM | POA: Diagnosis not present

## 2020-08-26 NOTE — Patient Instructions (Signed)
Medication Instructions:  STOP [place]   *If you need a refill on your cardiac medications before your next appointment, please call your pharmacy*   Lab Work: None today If you have labs (blood work) drawn today and your tests are completely normal, you will receive your results only by: Marland Kitchen MyChart Message (if you have MyChart) OR . A paper copy in the mail If you have any lab test that is abnormal or we need to change your treatment, we will call you to review the results.   Testing/Procedures: Your physician has requested that you have an ankle brachial index (ABI). During this test an ultrasound and blood pressure cuff are used to evaluate the arteries that supply the  legs with blood. Allow thirty minutes for this exam. There are no restrictions or special instructions.    Follow-Up: At [facility], you and your health needs are our priority.  As part of our continuing mission to provide you with exceptional heart care, we have created designated Provider Care Teams.  These Care Teams include your primary Cardiologist (physician) and Advanced Practice Providers (APPs -  Physician Assistants and Nurse Practitioners) who all work together to provide you with the care you need, when you need it.  We recommend signing up for the patient portal called "MyChart".  Sign up information is provided on this After Visit Summary.  MyChart is used to connect with patients for Virtual Visits (Telemedicine).  Patients are able to view lab/test results, encounter notes, upcoming appointments, etc.  Non-urgent messages can be sent to your provider as well.   To learn more about what you can do with MyChart, go to [email or web address].    Your next appointment:   3 month(s)  The format for your next appointment:   In Person  Provider:   Jenkins Rouge, MD   Other Instructions Schedule follow up with Dr.[name] after testing done. [phone]   Schedule follow up with Dr.Greg  Lovena Le

## 2020-08-27 ENCOUNTER — Other Ambulatory Visit: Payer: Self-pay | Admitting: Cardiovascular Disease

## 2020-08-27 DIAGNOSIS — I739 Peripheral vascular disease, unspecified: Secondary | ICD-10-CM

## 2020-09-07 ENCOUNTER — Other Ambulatory Visit: Payer: Self-pay | Admitting: Family

## 2020-09-07 DIAGNOSIS — E1142 Type 2 diabetes mellitus with diabetic polyneuropathy: Secondary | ICD-10-CM

## 2020-09-12 DIAGNOSIS — L97511 Non-pressure chronic ulcer of other part of right foot limited to breakdown of skin: Secondary | ICD-10-CM | POA: Diagnosis not present

## 2020-09-17 ENCOUNTER — Telehealth: Payer: Self-pay

## 2020-09-19 ENCOUNTER — Other Ambulatory Visit: Payer: Self-pay

## 2020-09-19 ENCOUNTER — Encounter: Payer: Medicare Other | Admitting: *Deleted

## 2020-09-19 VITALS — BP 150/65 | HR 70

## 2020-09-19 DIAGNOSIS — Z006 Encounter for examination for normal comparison and control in clinical research program: Secondary | ICD-10-CM

## 2020-09-19 NOTE — Research (Signed)
Patient came into research clinic today for CLEAR visit T16, M42. Patient was 100% compliant with IP since last visit in November 2021. All concomitant medications have been reviewed and updated if applicable. There are no new AE's or SAE's to report to sponsor at this time. Next appointment scheduled for Tuesday, June 28th, 2022 @ 0800 am. This will be the subjects end of study visit.

## 2020-09-22 ENCOUNTER — Other Ambulatory Visit: Payer: Self-pay | Admitting: Family

## 2020-09-22 DIAGNOSIS — Z9861 Coronary angioplasty status: Secondary | ICD-10-CM

## 2020-09-22 DIAGNOSIS — I2510 Atherosclerotic heart disease of native coronary artery without angina pectoris: Secondary | ICD-10-CM

## 2020-09-22 DIAGNOSIS — K219 Gastro-esophageal reflux disease without esophagitis: Secondary | ICD-10-CM

## 2020-09-22 DIAGNOSIS — I5030 Unspecified diastolic (congestive) heart failure: Secondary | ICD-10-CM

## 2020-09-24 ENCOUNTER — Ambulatory Visit (INDEPENDENT_AMBULATORY_CARE_PROVIDER_SITE_OTHER): Payer: Medicare Other | Admitting: Pharmacist

## 2020-09-24 ENCOUNTER — Other Ambulatory Visit: Payer: Self-pay

## 2020-09-24 DIAGNOSIS — E1122 Type 2 diabetes mellitus with diabetic chronic kidney disease: Secondary | ICD-10-CM | POA: Diagnosis not present

## 2020-09-24 DIAGNOSIS — E119 Type 2 diabetes mellitus without complications: Secondary | ICD-10-CM

## 2020-09-24 DIAGNOSIS — R809 Proteinuria, unspecified: Secondary | ICD-10-CM | POA: Diagnosis not present

## 2020-09-24 DIAGNOSIS — E1129 Type 2 diabetes mellitus with other diabetic kidney complication: Secondary | ICD-10-CM | POA: Diagnosis not present

## 2020-09-24 DIAGNOSIS — I129 Hypertensive chronic kidney disease with stage 1 through stage 4 chronic kidney disease, or unspecified chronic kidney disease: Secondary | ICD-10-CM | POA: Diagnosis not present

## 2020-09-24 DIAGNOSIS — G720 Drug-induced myopathy: Secondary | ICD-10-CM

## 2020-09-24 DIAGNOSIS — N189 Chronic kidney disease, unspecified: Secondary | ICD-10-CM | POA: Diagnosis not present

## 2020-09-24 MED ORDER — DAPAGLIFLOZIN PROPANEDIOL 10 MG PO TABS: 10.0000 mg | ORAL_TABLET | Freq: Every day | ORAL | 3 refills | Status: DC

## 2020-09-24 NOTE — Progress Notes (Signed)
    09/24/2020 Name: [name] [name] MRN: [id number] DOB: Nov 14, 1950   S:  36 yoM Presents for diabetes evaluation, education, and management Patient was referred and last seen by Primary Care Provider on 4//4/22.  Insurance coverage/medication affordability: U[employer] medicare  Patient reports adherence with medications.  Current diabetes medications include: farxiga 5mg , tresiba 80u BID, humalog 15 un TID meals, ozempic 1mg   Current hypertension medications include: telmisartan Goal 130/80  Current hyperlipidemia medications include: fibrate ICD 10 code: G72 added (drug induced myopathy)  Statin allergy updated in the EMR Medications reviewed Patient follows with cardiology and is currently takingfenofibratefor HLD, He is currently enrolled in the [facility] to follow and make adjustments   Patient denies hypoglycemic events.   Patient reported dietary habits: Eats 3 meals/day Discussed meal planning options and Plate method for healthy eating . Avoid sugary drinks and desserts . Incorporate balanced protein, non starchy veggies, 1 serving of carbohydrate with each meal . Increase water intake . Increase physical activity as able  Patient reports self foot exams. See podiatry annually    O:  Lab Results  Component Value Date   HGBA1C 7.0 (H) 08/19/2020   Lipid Panel     Component Value Date/Time   CHOL 172 05/23/2020 0051   CHOL 232 (H) 02/14/2020 1135   TRIG 378 (H) 05/23/2020 0051   HDL 21 (L) 05/23/2020 0051   HDL 15 (L) 02/14/2020 1135   CHOLHDL 8.2 05/23/2020 0051   VLDL 76 (H) 05/23/2020 0051   LDLCALC 75 05/23/2020 0051   LDLCALC 108 (H) 02/14/2020 1135   LDLDIRECT 172 (H) 11/08/2014 0908     Home fasting blood sugars: <130  2 hour post-meal/random blood sugars: <180.    Clinical Atherosclerotic Cardiovascular Disease (ASCVD): Yes   The ASCVD Risk score Mikey Bussing DC Jr., et al., 2013) failed to calculate for the following reasons:   The  patient has a prior MI or stroke diagnosis    A/P:  Diabetes T2DM currently controlled. Patient is adherent with medication.   -APPLICATION FOR NOVO [place] prepared--awaiting patient financials (tresiba, fiasp, ozempic) for 2022             Medication to ship to PCP office (4 month supply)  -CONTINUE Tresiba 80 units UNITStwice daily with meals             Will switch to u-200-more concentrated  -Continue meal time insulin for now--Humalog 15-20 units with each meal--will switch to Fiasp for rapid acting once patient assistance approved  -Continue Ozempic 1mg  sq weekly (GLP1)  -Increase SGLT2-I FARXIGA to 10MG  DAILY(generic name DAPAGLIFLOZIN)for kidney/heart protection as well as for patient's DM.  GFR 47  CounseledSick day rules: ifsick, vomiting, have diarrhea, or cannot drink enough fluids, you should stop taking SGLT-2 inhibitors until your symptoms go away. In rare cases, these medicines can cause diabetic ketoacidosis (DKA). DKA is acid buildup in the blood.  -[facility] APPLICATION SUBMITTED--WILL LIKELY START ON NEXLETOL--FINISHING CLEAR TRIAL  -Extensively discussed pathophysiology of diabetes, recommended lifestyle interventions, dietary effects on blood sugar control  -Counseled on s/sx of and management of hypoglycemia  -Next A1C anticipated 11/19/20.  Written patient instructions provided.  Total time in face to face counseling 30 minutes.   Follow up PCP Clinic Visit ON 11/19/20.   Regina Eck, PharmD, BCPS Clinical Pharmacist, [place]  II Phone [phone]

## 2020-09-25 NOTE — Telephone Encounter (Signed)
Returned call --appt w/ pharmD on 09/24/20

## 2020-09-27 DIAGNOSIS — E1129 Type 2 diabetes mellitus with other diabetic kidney complication: Secondary | ICD-10-CM | POA: Diagnosis not present

## 2020-09-27 DIAGNOSIS — I129 Hypertensive chronic kidney disease with stage 1 through stage 4 chronic kidney disease, or unspecified chronic kidney disease: Secondary | ICD-10-CM | POA: Diagnosis not present

## 2020-09-27 DIAGNOSIS — E559 Vitamin D deficiency, unspecified: Secondary | ICD-10-CM | POA: Diagnosis not present

## 2020-09-27 DIAGNOSIS — R809 Proteinuria, unspecified: Secondary | ICD-10-CM | POA: Diagnosis not present

## 2020-09-27 DIAGNOSIS — N189 Chronic kidney disease, unspecified: Secondary | ICD-10-CM | POA: Diagnosis not present

## 2020-09-27 DIAGNOSIS — E1122 Type 2 diabetes mellitus with diabetic chronic kidney disease: Secondary | ICD-10-CM | POA: Diagnosis not present

## 2020-09-27 DIAGNOSIS — I5032 Chronic diastolic (congestive) heart failure: Secondary | ICD-10-CM | POA: Diagnosis not present

## 2020-09-27 DIAGNOSIS — E211 Secondary hyperparathyroidism, not elsewhere classified: Secondary | ICD-10-CM | POA: Diagnosis not present

## 2020-09-29 ENCOUNTER — Other Ambulatory Visit: Payer: Self-pay | Admitting: Family

## 2020-09-29 DIAGNOSIS — Z9861 Coronary angioplasty status: Secondary | ICD-10-CM

## 2020-09-29 DIAGNOSIS — I2510 Atherosclerotic heart disease of native coronary artery without angina pectoris: Secondary | ICD-10-CM

## 2020-09-30 ENCOUNTER — Other Ambulatory Visit: Payer: Self-pay

## 2020-09-30 DIAGNOSIS — I739 Peripheral vascular disease, unspecified: Secondary | ICD-10-CM

## 2020-10-01 DIAGNOSIS — M5136 Other intervertebral disc degeneration, lumbar region: Secondary | ICD-10-CM | POA: Diagnosis not present

## 2020-10-01 DIAGNOSIS — M4726 Other spondylosis with radiculopathy, lumbar region: Secondary | ICD-10-CM | POA: Diagnosis not present

## 2020-10-02 ENCOUNTER — Ambulatory Visit (INDEPENDENT_AMBULATORY_CARE_PROVIDER_SITE_OTHER): Payer: Medicare Other

## 2020-10-02 ENCOUNTER — Telehealth: Payer: Self-pay | Admitting: *Deleted

## 2020-10-02 DIAGNOSIS — I739 Peripheral vascular disease, unspecified: Secondary | ICD-10-CM | POA: Diagnosis not present

## 2020-10-02 NOTE — Telephone Encounter (Signed)
Left message on patient's voicemail as listed on DPR and to call back if any questions.

## 2020-10-02 NOTE — Telephone Encounter (Signed)
-----   Message from Josue Hector, MD sent at 10/02/2020  1:56 PM EDT ----- Moderate PVD ABI"s stable sees Dr [name] previous right SFA stent

## 2020-10-02 NOTE — Telephone Encounter (Signed)
-----   Message from Josue Hector, MD sent at 10/02/2020  1:57 PM EDT ----- Some restenosis of proximal right SFA stent f/u Dr [name]

## 2020-10-02 NOTE — Telephone Encounter (Signed)
Left message on patient's voice mailbox as specified on DPR.  Told to call back with any questions.

## 2020-10-03 ENCOUNTER — Other Ambulatory Visit: Payer: Self-pay | Admitting: Cardiovascular Disease

## 2020-10-03 DIAGNOSIS — I739 Peripheral vascular disease, unspecified: Secondary | ICD-10-CM

## 2020-10-08 ENCOUNTER — Telehealth: Payer: Self-pay | Admitting: Family

## 2020-10-08 NOTE — Telephone Encounter (Signed)
Pt called requesting to speak with Almyra Free. Has questions about Patient Assistance Program paperwork.  [phone]

## 2020-10-09 NOTE — Telephone Encounter (Signed)
RETURNED CALL TO PATIENT RE: PATIENT ASSISTANCE PROGRAMS

## 2020-10-16 ENCOUNTER — Other Ambulatory Visit: Payer: Self-pay

## 2020-10-16 ENCOUNTER — Encounter: Payer: Self-pay | Admitting: Internal Medicine

## 2020-10-16 ENCOUNTER — Ambulatory Visit: Payer: Medicare Other | Admitting: Internal Medicine

## 2020-10-16 VITALS — BP 156/80 | HR 69 | Ht 74.0 in | Wt 291.2 lb

## 2020-10-16 DIAGNOSIS — I2510 Atherosclerotic heart disease of native coronary artery without angina pectoris: Secondary | ICD-10-CM | POA: Diagnosis not present

## 2020-10-16 DIAGNOSIS — I480 Paroxysmal atrial fibrillation: Secondary | ICD-10-CM | POA: Diagnosis not present

## 2020-10-16 NOTE — Progress Notes (Signed)
HPI Mr. Diana returns for followup of PAF. He is a pleasant but obese 70 yo man with a h/o CAD, HTN, and diastolic CHF. He developed atrial fib with a RVR and I recommended he start amiodarone. His palpitations resolved. He began to feel badly and stopped the amiodarone. He has not had recurrent symptoms. He has not had any bleeding.  Allergies  Allergen Reactions  . Shellfish Allergy Anaphylaxis and Other (See Comments)    Tongue swelling, hives   . Sulfa Antibiotics Anaphylaxis and Rash    Tongue swelling, hives  . Ace Inhibitors Other (See Comments) and Cough    CKD, renal failure   . Invokana [Canagliflozin] Other (See Comments)    Syncope / dehydration  . Lisinopril Cough  . Metformin And Related Itching  . Pravastatin Sodium Other (See Comments)    myalgias  . Milk-Related Compounds     Ties stomach in knots   . Crestor [Rosuvastatin] Other (See Comments)    Myalgias   . Fenofibrate Other (See Comments)    Body aches - pt currently taking isnt sure if its causing any pain  . Horse-Derived Products Rash  . Iodine Other (See Comments)    ????. PATIENT SAYS HE DID FINE WITH LAST CT WITH CONTRAST** patient had IV contrast on 06/21/2018 without pre meds without any issues. Patient had allergic reaction to Shellfish 30 years ago-not to IV contrast.   . Lexapro [Escitalopram Oxalate] Other (See Comments)    Buzzing in ears,headache, felt like a zombie  . Lipitor [Atorvastatin] Other (See Comments)    myalgias  . Livalo [Pitavastatin] Other (See Comments)    Myalgias   . Repatha [Evolocumab] Other (See Comments)    Myalgias, flu like sx  . Tape Rash     Current Outpatient Medications  Medication Sig Dispense Refill  . albuterol (VENTOLIN HFA) 108 (90 Base) MCG/ACT inhaler Inhale 2 puffs into the lungs every 6 (six) hours as needed for wheezing or shortness of breath. 8.5 g 3  . AMBULATORY NON FORMULARY MEDICATION Take 180 mg by mouth daily at 12 noon. Medication  Name: Clear Study Drug-Bempodic acid versus placebo    . apixaban (ELIQUIS) 5 MG TABS tablet Take 1 tablet (5 mg total) by mouth 2 (two) times daily. 180 tablet 1  . Chlorphen-Phenyleph-ASA (ALKA-SELTZER PLUS COLD PO) Take 2 tablets by mouth daily as needed (allergies).    . clopidogrel (PLAVIX) 75 MG tablet Take 1 tablet by mouth once daily 90 tablet 0  . dapagliflozin propanediol (FARXIGA) 10 MG TABS tablet Take 1 tablet (10 mg total) by mouth daily before breakfast. 90 tablet 3  . DULoxetine (CYMBALTA) 30 MG capsule Take 1 capsule by mouth once daily 90 capsule 0  . fenofibrate 160 MG tablet TAKE 1 TABLET BY MOUTH ONCE DAILY FOR CHOLESTEROL AND TRIGLYCERIDES 90 tablet 4  . furosemide (LASIX) 20 MG tablet TAKE 1 TABLET BY MOUTH ONCE DAILY AS NEEDED FOR EDEMA 90 tablet 1  . gabapentin (NEURONTIN) 300 MG capsule Take 3 capsules (900 mg total) by mouth 3 (three) times daily. 810 capsule 3  . HYDROcodone-acetaminophen (NORCO) 10-325 MG tablet Take 1 tablet by mouth 3 (three) times daily as needed.    . insulin aspart (FIASP FLEXTOUCH) 100 UNIT/ML FlexTouch Pen Inject 15-20 Units into the skin 3 (three) times daily with meals.    . insulin degludec (TRESIBA FLEXTOUCH) 200 UNIT/ML FlexTouch Pen Inject 80 Units into the skin 2 (two) times daily.    Marland Kitchen  isosorbide mononitrate (IMDUR) 30 MG 24 hr tablet Take 1 tablet by mouth once daily 90 tablet 0  . linaclotide (LINZESS) 72 MCG capsule Take 1 capsule (72 mcg total) by mouth daily as needed (constipation). 90 capsule 3  . Menthol, Topical Analgesic, (BLUE-EMU MAXIMUM STRENGTH EX) Apply 1 application topically daily as needed (back pain).    . metoprolol succinate (TOPROL-XL) 50 MG 24 hr tablet Take 1 tablet by mouth once daily 90 tablet 3  . metoprolol tartrate (LOPRESSOR) 50 MG tablet Take 50 mg by mouth daily as needed (a-fib).    . nitroGLYCERIN (NITROSTAT) 0.4 MG SL tablet DISSOLVE ONE TABLET UNDER THE TONGUE EVERY 5 MINUTES AS NEEDED FOR CHEST PAIN.   DO NOT EXCEED A TOTAL OF 3 DOSES IN 15 MINUTES 25 tablet 0  . pantoprazole (PROTONIX) 40 MG tablet Take 1 tablet by mouth once daily 90 tablet 1  . Semaglutide, 1 MG/DOSE, (OZEMPIC, 1 MG/DOSE,) 2 MG/1.5ML SOPN Inject 0.75 mLs (1 mg total) into the skin once a week. 12 mL 4  . triamcinolone (NASACORT) 55 MCG/ACT AERO nasal inhaler Place 1 spray into the nose as needed (allergies).    . vitamin B-12 (CYANOCOBALAMIN) 1000 MCG tablet Take 1,000 mcg by mouth daily.     No current facility-administered medications for this visit.     Past Medical History:  Diagnosis Date  . Anxiety   . Arthritis   . CAD (coronary artery disease)    a. 2010: DES to CTO of RCA. EF 55% b. 07/2016: cath showing total occlusion within previously placed RCA stent (collaterals present), severe stenosis along LCx and OM1 (treated with 2 overlapping DES). c. repeat cath in 01/2018 showing patent stents along LCx and OM with CTO of D2, CTO of distal LCx, and CTO of RCA with collaterals present overall unchanged since 2018 with medical management recom  . Cellulitis and abscess rt groin   . Complication of anesthesia    " I woke up during a colonoscopy "     . Depression   . Diabetes mellitus   . Disorders of iron metabolism   . GERD (gastroesophageal reflux disease)   . Hyperlipidemia   . Hypertension   . Low serum testosterone level   . Medically noncompliant   . Myocardial infarction ([place])     ROS:   All systems reviewed and negative except as noted in the HPI.   Past Surgical History:  Procedure Laterality Date  . ABDOMINAL AORTOGRAM W/LOWER EXTREMITY N/A 03/21/2019   Procedure: ABDOMINAL AORTOGRAM W/LOWER EXTREMITY;  Surgeon: [name], MD;  Location: [facility];  Service: Cardiovascular;  Laterality: N/A;  . BACK SURGERY  2015   ACDF by Dr. [name]  . COLONOSCOPY N/A 10/01/2014   Dr. [name]: multiple tubular adenomas removed, colonic diverticulosis, redundant colon. next tcs advised for 09/2017.  PATIENT NEEDS PROPOFOL FOR FAILED CONSCIOUS SEDATION  . CORONARY STENT INTERVENTION N/A 07/30/2016   Procedure: Coronary Stent Intervention;  Surgeon: [name], MD;  Location: [facility];  Service: Cardiovascular;  Laterality: N/A;  . CORONARY STENT PLACEMENT  2000   By Dr. Olevia Perches  . EP IMPLANTABLE DEVICE N/A 05/25/2016   Procedure: Loop Recorder Insertion;  Surgeon: [name], MD;  Location: [facility];  Service: Cardiovascular;  Laterality: N/A;  . ESOPHAGOGASTRODUODENOSCOPY     esophagus [facility] at [facility]  . ESOPHAGOGASTRODUODENOSCOPY N/A 10/01/2014   Dr. [name]: patchy mottling/erythema and minimal polypoid appearance of gastric mucosa.  bx with mild inlammation but no H.pylori  . HERNIA REPAIR  [id number]   umbilical  . LEFT HEART CATH AND CORONARY ANGIOGRAPHY N/A 07/30/2016   Procedure: Left Heart Cath and Coronary Angiography;  Surgeon: [name], MD;  Location: [place] [facility];  Service: Cardiovascular;  Laterality: N/A;  . LEFT HEART CATH AND CORONARY ANGIOGRAPHY N/A 01/19/2018   Procedure: LEFT HEART CATH AND CORONARY ANGIOGRAPHY;  Surgeon: [name], MD;  Location: [facility];  Service: Cardiovascular;  Laterality: N/A;  . LEFT HEART CATH AND CORONARY ANGIOGRAPHY N/A 05/24/2020   Procedure: LEFT HEART CATH AND CORONARY ANGIOGRAPHY;  Surgeon: Burnell Blanks, MD;  Location: [facility];  Service: Cardiovascular;  Laterality: N/A;  . LESION REMOVAL     Lip and hand   . LOWER EXTREMITY ANGIOGRAPHY N/A 04/18/2019   Procedure: LOWER EXTREMITY ANGIOGRAPHY;  Surgeon: [name], MD;  Location: [place];  Service: Cardiovascular;  Laterality: N/A;  . NECK SURGERY    . PERIPHERAL VASCULAR BALLOON ANGIOPLASTY  04/18/2019   Procedure: PERIPHERAL VASCULAR BALLOON ANGIOPLASTY;  Surgeon: [name], MD;  Location: [facility];  Service: Cardiovascular;;  . PERIPHERAL VASCULAR INTERVENTION Right 03/21/2019    Procedure: PERIPHERAL VASCULAR INTERVENTION;  Surgeon: [name], MD;  Location: [place] [facility];  Service: Cardiovascular;  Laterality: Right;  superficial femoral     Family History  Problem Relation Age of Onset  . Diabetes Father   . Valvular heart disease Father   . Arthritis Father   . Heart disease Father   . Stroke Father   . Alzheimer's disease Mother   . Hyperlipidemia Mother   . Hypertension Mother   . Arthritis Mother   . Lung cancer Mother   . Stroke Mother   . Headache Mother   . Arthritis/Rheumatoid Sister   . Diabetes Sister   . Hypertension Sister   . Hyperlipidemia Sister   . Depression Sister   . Dementia Maternal Aunt   . Dementia Maternal Uncle   . Heart disease Maternal Uncle   . Stomach cancer Paternal Uncle   . Colon cancer Neg Hx   . Liver disease Neg Hx      Social History   Socioeconomic History  . Marital status: Married    Spouse name: Not on file  . Number of children: 2  . Years of education: 41  . Highest education level: 12th grade  Occupational History  . Occupation: retired  Tobacco Use  . Smoking status: Former Smoker    Packs/day: 0.25    Years: 51.00    Pack years: 12.75    Types: Cigarettes    Quit date: 08/14/2016    Years since quitting: 4.1  . Smokeless tobacco: Never Used  . Tobacco comment: smokes  a pack a week  Vaping Use  . Vaping Use: Never used  Substance and Sexual Activity  . Alcohol use: No    Alcohol/week: 0.0 standard drinks  . Drug use: No  . Sexual activity: Yes  Other Topics Concern  . Not on file  Social History Narrative   Lives with his wife.  Retired.  Unable to afford expensive medicines.        Caffeine: 1 cup of 1/2 caff coffee in AM, drinks more if at a restaurant    Social Determinants of Health   Financial Resource Strain: Not on file  Food Insecurity: Not on file  Transportation Needs: Not  on file  Physical Activity: Not on file  Stress: Not on file  Social  Connections: Not on file  Intimate Partner Violence: Not on file     BP (!) 156/80   Pulse 69   Ht 6\' 2"  (1.88 m)   Wt 291 lb 3.2 oz (132.1 kg)   SpO2 99%   BMI 37.39 kg/m   Physical Exam:  Well appearing NAD HEENT: Unremarkable Neck:  No JVD, no thyromegally Lymphatics:  No adenopathy Back:  No CVA tenderness Lungs:  Clear with no wheezes HEART:  Regular rate rhythm, no murmurs, no rubs, no clicks Abd:  soft, positive bowel sounds, no organomegally, no rebound, no guarding Ext:  2 plus pulses, no edema, no cyanosis, no clubbing Skin:  No rashes no nodules Neuro:  CN II through XII intact, motor grossly intact  Assess/Plan: 1. PAF - He has not had recurrent symptoms. I suspect that he will have more atrial fib. I would consider mutaq or dofetilide if he has more symptomatic atrial fib.  2. Obesity - he has lost 20 lbs and he plans to lose 60 more. 3. HTN - he did not take his meds this morning. He will take when he gets home.  4. CAD - he denies anginal symptoms despite his CTO of the RCA.   Carleene Overlie Haya Hemler,MD

## 2020-10-16 NOTE — Patient Instructions (Signed)
Medication Instructions:  Your physician recommends that you continue on your current medications as directed. Please refer to the Current Medication list given to you today.  *If you need a refill on your cardiac medications before your next appointment, please call your pharmacy*   Lab Work: None today If you have labs (blood work) drawn today and your tests are completely normal, you will receive your results only by: Marland Kitchen MyChart Message (if you have MyChart) OR . A paper copy in the mail If you have any lab test that is abnormal or we need to change your treatment, we will call you to review the results.   Testing/Procedures: None today    Follow-Up: At [facility], you and your health needs are our priority.  As part of our continuing mission to provide you with exceptional heart care, we have created designated Provider Care Teams.  These Care Teams include your primary Cardiologist (physician) and Advanced Practice Providers (APPs -  Physician Assistants and Nurse Practitioners) who all work together to provide you with the care you need, when you need it.  We recommend signing up for the patient portal called "MyChart".  Sign up information is provided on this After Visit Summary.  MyChart is used to connect with patients for Virtual Visits (Telemedicine).  Patients are able to view lab/test results, encounter notes, upcoming appointments, etc.  Non-urgent messages can be sent to your provider as well.   To learn more about what you can do with MyChart, go to [email or web address].    Your next appointment:   6 month(s)  The format for your next appointment:   In Person  Provider:   Cristopher Peru, MD   Other Instructions None

## 2020-10-22 DIAGNOSIS — L97511 Non-pressure chronic ulcer of other part of right foot limited to breakdown of skin: Secondary | ICD-10-CM | POA: Diagnosis not present

## 2020-10-25 ENCOUNTER — Telehealth: Payer: Self-pay | Admitting: Family

## 2020-10-25 NOTE — Telephone Encounter (Signed)
No samples available, patient aware 

## 2020-10-25 NOTE — Telephone Encounter (Signed)
  Prescription Request  10/25/2020  What is the name of the medication or equipment? Pt needs samples of tresiba and humalog quick pen  Have you contacted your pharmacy to request a refill? (if applicable) na  Which pharmacy would you like this sent to? na   Patient notified that their request is being sent to the clinical staff for review and that they should receive a response within 2 business days.

## 2020-11-01 ENCOUNTER — Telehealth: Payer: Self-pay | Admitting: Family

## 2020-11-01 MED ORDER — INSULIN LISPRO (1 UNIT DIAL) 100 UNIT/ML (KWIKPEN): 15.0000 [IU] | PEN_INJECTOR | Freq: Three times a day (TID) | SUBCUTANEOUS | 11 refills | Status: DC

## 2020-11-01 NOTE — Telephone Encounter (Signed)
Patient is out of short acting insulin Awaiting patient assistance program shipment Will call in humalog in the meantime  Patient at [facility] now

## 2020-11-01 NOTE — Telephone Encounter (Signed)
Do we have samples of Pt needs samples of tresiba humalog quick pen and ozempic. Please call back

## 2020-11-01 NOTE — Telephone Encounter (Signed)
NO SAMPLES THEY WERE ASKING IF WE COULD SEND FAST ACTION NOVALOG IN FOR THEM IF THAT WOULD COMPARE

## 2020-11-04 ENCOUNTER — Encounter (HOSPITAL_COMMUNITY): Payer: Medicare Other

## 2020-11-04 ENCOUNTER — Ambulatory Visit: Payer: Medicare Other | Admitting: Surgery

## 2020-11-04 ENCOUNTER — Encounter: Payer: Self-pay | Admitting: Surgery

## 2020-11-04 ENCOUNTER — Other Ambulatory Visit: Payer: Self-pay

## 2020-11-04 ENCOUNTER — Other Ambulatory Visit: Payer: Self-pay | Admitting: Family

## 2020-11-04 ENCOUNTER — Telehealth: Payer: Self-pay | Admitting: *Deleted

## 2020-11-04 VITALS — BP 138/83 | HR 76 | Temp 98.1°F | Resp 20 | Ht 74.0 in | Wt 286.0 lb

## 2020-11-04 DIAGNOSIS — F331 Major depressive disorder, recurrent, moderate: Secondary | ICD-10-CM

## 2020-11-04 DIAGNOSIS — I7025 Atherosclerosis of native arteries of other extremities with ulceration: Secondary | ICD-10-CM

## 2020-11-04 DIAGNOSIS — E1142 Type 2 diabetes mellitus with diabetic polyneuropathy: Secondary | ICD-10-CM

## 2020-11-04 NOTE — Telephone Encounter (Signed)
   [facility] Pre-operative Risk Assessment    Patient Name: [name] [name]  DOB: [birth year]  MRN: [id number]   HEARTCARE STAFF: - Please ensure there is not already an duplicate clearance open for this procedure. - Under Visit Info/Reason for Call, type in Other and utilize the format Clearance MM/DD/YY or Clearance TBD. Do not use dashes or single digits. - If request is for dental extraction, please clarify the # of teeth to be extracted. - If the patient is currently at the dentist's office, call Pre-Op APP to address. If the patient is not currently in the dentist office, please route to the Pre-Op pool  Request for surgical clearance:  What type of surgery is being performed? ABDOMINAL ANGIOGRAM w/BL LOWER EXTREMITY RUNOFF AND POSSIBLE INTERVENTION   When is this surgery scheduled? TBD   What type of clearance is required (medical clearance vs. Pharmacy clearance to hold med vs. Both)? BOTH  Are there any medications that need to be held prior to surgery and how long? ELIQUIS x 3 DAYS PRIOR TO SURGERY   Practice name and name of physician performing surgery? DR. Harold Barban   What is the office phone number? [phone]   7.   What is the office fax number? [phone]  8.   Anesthesia type (None, local, MAC, general) ? IV SEDATION   Julaine Hua 11/04/2020, 2:11 PM  _________________________________________________________________   (provider comments below)

## 2020-11-04 NOTE — Telephone Encounter (Signed)
Pharmacy, can you please comment on how long Eliquis can be held for upcoming procedure?  Thank you! 

## 2020-11-04 NOTE — Progress Notes (Signed)
Vascular and Vein Specialist of [facility]  Patient name: [name] [name] MRN: [id number] DOB: 04-29-1951 Sex: male   REASON FOR VISIT:    Follow up  HISOTRY OF PRESENT ILLNESS:   [name] [name] is a 70 y.o. male who for follow-up of his bilateral claudication.  On 04/17/2019 he underwent stenting of multiple right SFA stenoses.  I tried to recanalize his left superficial femoral artery without success.  He had significant improvement in the symptoms of his right leg.  We talked about left leg bypass, however he was able to tolerate his level of disability, and so surgery was not performed.  He has had a wound on his right great toe for a long time which does appear to be healing.  There is also a relatively recent ulcer on the dorsum of his left foot.   The patient has a history of coronary artery disease.  He underwent DES in 2010 in 2018.  He suffers from atrial fibrillation.  He is on Eliquis and has a loop recorder implanted.  He is statin and Repatha intolerant.  He is currently involved in a research study to address his hypercholesterolemia.  He is a diabetic.   PAST MEDICAL HISTORY:   Past Medical History:  Diagnosis Date   Anxiety    Arthritis    CAD (coronary artery disease)    a. 2010: DES to CTO of RCA. EF 55% b. 07/2016: cath showing total occlusion within previously placed RCA stent (collaterals present), severe stenosis along LCx and OM1 (treated with 2 overlapping DES). c. repeat cath in 01/2018 showing patent stents along LCx and OM with CTO of D2, CTO of distal LCx, and CTO of RCA with collaterals present overall unchanged since 2018 with medical management recom   Cellulitis and abscess rt groin    Complication of anesthesia    " I woke up during a colonoscopy "      Depression    Diabetes mellitus    Disorders of iron metabolism    GERD (gastroesophageal reflux disease)    Hyperlipidemia    Hypertension    Low serum testosterone  level    Medically noncompliant    Myocardial infarction [facility])      FAMILY HISTORY:   Family History  Problem Relation Age of Onset   Diabetes Father    Valvular heart disease Father    Arthritis Father    Heart disease Father    Stroke Father    Alzheimer's disease Mother    Hyperlipidemia Mother    Hypertension Mother    Arthritis Mother    Lung cancer Mother    Stroke Mother    Headache Mother    Arthritis/Rheumatoid Sister    Diabetes Sister    Hypertension Sister    Hyperlipidemia Sister    Depression Sister    Dementia Maternal Aunt    Dementia Maternal Uncle    Heart disease Maternal Uncle    Stomach cancer Paternal Uncle    Colon cancer Neg Hx    Liver disease Neg Hx     SOCIAL HISTORY:   Social History   Tobacco Use   Smoking status: Former    Packs/day: 0.25    Years: 51.00    Pack years: 12.75    Types: Cigarettes    Quit date: 08/14/2016    Years since quitting: 4.2   Smokeless tobacco: Never   Tobacco comments:    smokes  a pack a week  Substance Use  Topics   Alcohol use: No    Alcohol/week: 0.0 standard drinks     ALLERGIES:   Allergies  Allergen Reactions   Shellfish Allergy Anaphylaxis and Other (See Comments)    Tongue swelling, hives    Sulfa Antibiotics Anaphylaxis and Rash    Tongue swelling, hives   Ace Inhibitors Other (See Comments) and Cough    CKD, renal failure    Invokana [Canagliflozin] Other (See Comments)    Syncope / dehydration   Lisinopril Cough   Metformin And Related Itching   Pravastatin Sodium Other (See Comments)    myalgias   Milk-Related Compounds     Ties stomach in knots    Crestor [Rosuvastatin] Other (See Comments)    Myalgias    Fenofibrate Other (See Comments)    Body aches - pt currently taking isnt sure if its causing any pain   Horse-Derived Products Rash   Iodine Other (See Comments)    ????. PATIENT SAYS HE DID FINE WITH LAST CT WITH CONTRAST** patient had IV contrast on 06/21/2018  without pre meds without any issues. Patient had allergic reaction to Shellfish 30 years ago-not to IV contrast.    Lexapro [Escitalopram Oxalate] Other (See Comments)    Buzzing in ears,headache, felt like a zombie   Lipitor [Atorvastatin] Other (See Comments)    myalgias   Livalo [Pitavastatin] Other (See Comments)    Myalgias    Repatha [Evolocumab] Other (See Comments)    Myalgias, flu like sx   Tape Rash     CURRENT MEDICATIONS:   Current Outpatient Medications  Medication Sig Dispense Refill   albuterol (VENTOLIN HFA) 108 (90 Base) MCG/ACT inhaler Inhale 2 puffs into the lungs every 6 (six) hours as needed for wheezing or shortness of breath. 8.5 g 3   AMBULATORY NON FORMULARY MEDICATION Take 180 mg by mouth daily at 12 noon. Medication Name: Clear Study Drug-Bempodic acid versus placebo     apixaban (ELIQUIS) 5 MG TABS tablet Take 1 tablet (5 mg total) by mouth 2 (two) times daily. 180 tablet 1   Chlorphen-Phenyleph-ASA (ALKA-SELTZER PLUS COLD PO) Take 2 tablets by mouth daily as needed (allergies).     clopidogrel (PLAVIX) 75 MG tablet Take 1 tablet by mouth once daily 90 tablet 0   dapagliflozin propanediol (FARXIGA) 10 MG TABS tablet Take 1 tablet (10 mg total) by mouth daily before breakfast. 90 tablet 3   DULoxetine (CYMBALTA) 30 MG capsule Take 1 capsule by mouth once daily 90 capsule 0   fenofibrate 160 MG tablet TAKE 1 TABLET BY MOUTH ONCE DAILY FOR CHOLESTEROL AND TRIGLYCERIDES 90 tablet 4   furosemide (LASIX) 20 MG tablet TAKE 1 TABLET BY MOUTH ONCE DAILY AS NEEDED FOR EDEMA 90 tablet 1   gabapentin (NEURONTIN) 300 MG capsule Take 3 capsules (900 mg total) by mouth 3 (three) times daily. 810 capsule 3   HYDROcodone-acetaminophen (NORCO) 10-325 MG tablet Take 1 tablet by mouth 3 (three) times daily as needed.     insulin aspart (FIASP FLEXTOUCH) 100 UNIT/ML FlexTouch Pen Inject 15-20 Units into the skin 3 (three) times daily with meals.     insulin degludec (TRESIBA  FLEXTOUCH) 200 UNIT/ML FlexTouch Pen Inject 80 Units into the skin 2 (two) times daily.     insulin lispro (HUMALOG KWIKPEN) 100 UNIT/ML KwikPen Inject 15-20 Units into the skin 3 (three) times daily with meals. 15 mL 11   isosorbide mononitrate (IMDUR) 30 MG 24 hr tablet Take 1 tablet by mouth once  daily 90 tablet 0   linaclotide (LINZESS) 72 MCG capsule Take 1 capsule (72 mcg total) by mouth daily as needed (constipation). 90 capsule 3   Menthol, Topical Analgesic, (BLUE-EMU MAXIMUM STRENGTH EX) Apply 1 application topically daily as needed (back pain).     metoprolol succinate (TOPROL-XL) 50 MG 24 hr tablet Take 1 tablet by mouth once daily 90 tablet 3   metoprolol tartrate (LOPRESSOR) 50 MG tablet Take 50 mg by mouth daily as needed (a-fib).     nitroGLYCERIN (NITROSTAT) 0.4 MG SL tablet DISSOLVE ONE TABLET UNDER THE TONGUE EVERY 5 MINUTES AS NEEDED FOR CHEST PAIN.  DO NOT EXCEED A TOTAL OF 3 DOSES IN 15 MINUTES 25 tablet 0   pantoprazole (PROTONIX) 40 MG tablet Take 1 tablet by mouth once daily 90 tablet 1   Semaglutide, 1 MG/DOSE, (OZEMPIC, 1 MG/DOSE,) 2 MG/1.5ML SOPN Inject 0.75 mLs (1 mg total) into the skin once a week. 12 mL 4   triamcinolone (NASACORT) 55 MCG/ACT AERO nasal inhaler Place 1 spray into the nose as needed (allergies).     vitamin B-12 (CYANOCOBALAMIN) 1000 MCG tablet Take 1,000 mcg by mouth daily.     No current facility-administered medications for this visit.    REVIEW OF SYSTEMS:   [X]  denotes positive finding, [ ]  denotes negative finding Cardiac  Comments:  Chest pain or chest pressure:    Shortness of breath upon exertion:    Short of breath when lying flat:    Irregular heart rhythm:        Vascular    Pain in calf, thigh, or hip brought on by ambulation:    Pain in feet at night that wakes you up from your sleep:     Blood clot in your veins:    Leg swelling:         Pulmonary    Oxygen at home:    Productive cough:     Wheezing:         Neurologic     Sudden weakness in arms or legs:     Sudden numbness in arms or legs:     Sudden onset of difficulty speaking or slurred speech:    Temporary loss of vision in one eye:     Problems with dizziness:         Gastrointestinal    Blood in stool:     Vomited blood:         Genitourinary    Burning when urinating:     Blood in urine:        Psychiatric    Major depression:         Hematologic    Bleeding problems:    Problems with blood clotting too easily:        Skin    Rashes or ulcers:        Constitutional    Fever or chills:      PHYSICAL EXAM:   Vitals:   11/04/20 1105  BP: 138/83  Pulse: 76  Resp: 20  Temp: 98.1 F (36.7 C)  SpO2: 95%  Weight: 286 lb (129.7 kg)  Height: 6\' 2"  (1.88 m)    GENERAL: The patient is a well-nourished male, in no acute distress. The vital signs are documented above. CARDIAC: There is a regular rate and rhythm.  VASCULAR: Nonpalpable pedal pulses PULMONARY: Non-labored respirations ABDOMEN: Soft and non-tender with normal pitched bowel sounds.  MUSCULOSKELETAL: There are no major deformities or cyanosis. NEUROLOGIC: No focal  weakness or paresthesias are detected. SKIN: See photo below PSYCHIATRIC: The patient has a normal affect.   STUDIES:   I have reviewed his ultrasound studies with the following findings: Right: 50-74% stenosis noted in the superficial femoral artery. Stenosis  is noted within the proximal stent.  Peak velocity is 288 cm/s  +-------+-----------+-----------+------------+------------+  ABI/TBIToday's ABIToday's TBIPrevious ABIPrevious TBI  +-------+-----------+-----------+------------+------------+  Right  0.79       0.54       0.72        0.65          +-------+-----------+-----------+------------+------------+  Left   0.68       0.67       0.72        0.55          +-------+-----------+-----------+------------+------------+          MEDICAL ISSUES:   Bilateral lower  extremity vascular disease with ulceration: Most recent duplex suggests a stenosis within the right leg stent, however velocities are less than 300 cm/s.  He has a known left superficial femoral artery occlusion.  I was unable to cross this and surgery was recommended however the patient was able to tolerate his symptoms back then which were just claudication.  Now he has a ulcer.  I think this is now a limb threatening situation.  I want to proceed with angiography with bilateral runoff via a right femoral approach.  I will attempt to recanalize the occlusion of the left superficial femoral artery I will need images of both legs.  If I cannot cross the lesion he would most likely need left leg bypass.  I have scheduled this for Tuesday, June 28.  I will stop his Eliquis prior to the procedure.    Leia Alf, MD, FACS [facility] Specialists of [facility] [phone] Pager [phone]

## 2020-11-04 NOTE — Telephone Encounter (Signed)
Patient with diagnosis of atrial fibrillation on Eliquis for anticoagulation.    Procedure: abdominal angiogram w/ BL lower extremity runoff and possible intervention Date of procedure: TBD   CHA2DS2-VASc Score = 7  This indicates a 11.2% annual risk of stroke. The patient's score is based upon: CHF History: Yes HTN History: Yes Diabetes History: Yes Stroke History: Yes Vascular Disease History: Yes Age Score: 1 Gender Score: 0    CrCl 69.8 (with adjusted body weight) Platelet count 370  History of CVA is from January 2018, imaging showed basal ganglia lacunar infarct.  No indication of concerns since.    Surgeon requesting 3 day hold for Eliquis.  With above history would recommend lovenox bridge, but will defer to primary cardiologist for final decision.

## 2020-11-04 NOTE — H&P (View-Only) (Signed)
[facility] and [facility]in Specialist of [facility]  Patient name: [name] [name] MRN: [id number] DOB: [birth year] Sex: male   REASON FOR VISIT:    Follow up  HISOTRY OF PRESENT ILLNESS:   [name] [name] is a 70 y.o. male who for follow-up of his bilateral claudication.  On 04/17/2019 he underwent stenting of multiple right SFA stenoses.  I tried to recanalize his left superficial femoral artery without success.  He had significant improvement in the symptoms of his right leg.  We talked about left leg bypass, however he was able to tolerate his level of disability, and so surgery was not performed.  He has had a wound on his right great toe for a long time which does appear to be healing.  There is also a relatively recent ulcer on the dorsum of his left foot.   The patient has a history of coronary artery disease.  He underwent DES in 2010 in 2018.  He suffers from atrial fibrillation.  He is on Eliquis and has a loop recorder implanted.  He is statin and Repatha intolerant.  He is currently involved in a research study to address his hypercholesterolemia.  He is a diabetic.   PAST MEDICAL HISTORY:   Past Medical History:  Diagnosis Date   Anxiety    Arthritis    CAD (coronary artery disease)    a. 2010: DES to CTO of RCA. EF 55% b. 07/2016: cath showing total occlusion within previously placed RCA stent (collaterals present), severe stenosis along LCx and OM1 (treated with 2 overlapping DES). c. repeat cath in 01/2018 showing patent stents along LCx and OM with CTO of D2, CTO of distal LCx, and CTO of RCA with collaterals present overall unchanged since 2018 with medical management recom   Cellulitis and abscess rt groin    Complication of anesthesia    " I woke up during a colonoscopy "      Depression    Diabetes mellitus    Disorders of iron metabolism    GERD (gastroesophageal reflux disease)    Hyperlipidemia    Hypertension    Low serum testosterone  level    Medically noncompliant    Myocardial infarction [facility])      FAMILY HISTORY:   Family History  Problem Relation Age of Onset   Diabetes Father    Valvular heart disease Father    Arthritis Father    Heart disease Father    Stroke Father    Alzheimer's disease Mother    Hyperlipidemia Mother    Hypertension Mother    Arthritis Mother    Lung cancer Mother    Stroke Mother    Headache Mother    Arthritis/Rheumatoid Sister    Diabetes Sister    Hypertension Sister    Hyperlipidemia Sister    Depression Sister    Dementia Maternal Aunt    Dementia Maternal Uncle    Heart disease Maternal Uncle    Stomach cancer Paternal Uncle    Colon cancer Neg Hx    Liver disease Neg Hx     SOCIAL HISTORY:   Social History   Tobacco Use   Smoking status: Former    Packs/day: 0.25    Years: 51.00    Pack years: 12.75    Types: Cigarettes    Quit date: 08/14/2016    Years since quitting: 4.2   Smokeless tobacco: Never   Tobacco comments:    smokes  a pack a week  Substance Use  Topics   Alcohol use: No    Alcohol/week: 0.0 standard drinks     ALLERGIES:   Allergies  Allergen Reactions   Shellfish Allergy Anaphylaxis and Other (See Comments)    Tongue swelling, hives    Sulfa Antibiotics Anaphylaxis and Rash    Tongue swelling, hives   Ace Inhibitors Other (See Comments) and Cough    CKD, renal failure    Invokana [Canagliflozin] Other (See Comments)    Syncope / dehydration   Lisinopril Cough   Metformin And Related Itching   Pravastatin Sodium Other (See Comments)    myalgias   Milk-Related Compounds     Ties stomach in knots    Crestor [Rosuvastatin] Other (See Comments)    Myalgias    Fenofibrate Other (See Comments)    Body aches - pt currently taking isnt sure if its causing any pain   Horse-Derived Products Rash   Iodine Other (See Comments)    ????. PATIENT SAYS HE DID FINE WITH LAST CT WITH CONTRAST** patient had IV contrast on 06/21/2018  without pre meds without any issues. Patient had allergic reaction to Shellfish 30 years ago-not to IV contrast.    Lexapro [Escitalopram Oxalate] Other (See Comments)    Buzzing in ears,headache, felt like a zombie   Lipitor [Atorvastatin] Other (See Comments)    myalgias   Livalo [Pitavastatin] Other (See Comments)    Myalgias    Repatha [Evolocumab] Other (See Comments)    Myalgias, flu like sx   Tape Rash     CURRENT MEDICATIONS:   Current Outpatient Medications  Medication Sig Dispense Refill   albuterol (VENTOLIN HFA) 108 (90 Base) MCG/ACT inhaler Inhale 2 puffs into the lungs every 6 (six) hours as needed for wheezing or shortness of breath. 8.5 g 3   AMBULATORY NON FORMULARY MEDICATION Take 180 mg by mouth daily at 12 noon. Medication Name: Clear Study Drug-Bempodic acid versus placebo     apixaban (ELIQUIS) 5 MG TABS tablet Take 1 tablet (5 mg total) by mouth 2 (two) times daily. 180 tablet 1   Chlorphen-Phenyleph-ASA (ALKA-SELTZER PLUS COLD PO) Take 2 tablets by mouth daily as needed (allergies).     clopidogrel (PLAVIX) 75 MG tablet Take 1 tablet by mouth once daily 90 tablet 0   dapagliflozin propanediol (FARXIGA) 10 MG TABS tablet Take 1 tablet (10 mg total) by mouth daily before breakfast. 90 tablet 3   DULoxetine (CYMBALTA) 30 MG capsule Take 1 capsule by mouth once daily 90 capsule 0   fenofibrate 160 MG tablet TAKE 1 TABLET BY MOUTH ONCE DAILY FOR CHOLESTEROL AND TRIGLYCERIDES 90 tablet 4   furosemide (LASIX) 20 MG tablet TAKE 1 TABLET BY MOUTH ONCE DAILY AS NEEDED FOR EDEMA 90 tablet 1   gabapentin (NEURONTIN) 300 MG capsule Take 3 capsules (900 mg total) by mouth 3 (three) times daily. 810 capsule 3   HYDROcodone-acetaminophen (NORCO) 10-325 MG tablet Take 1 tablet by mouth 3 (three) times daily as needed.     insulin aspart (FIASP FLEXTOUCH) 100 UNIT/ML FlexTouch Pen Inject 15-20 Units into the skin 3 (three) times daily with meals.     insulin degludec (TRESIBA  FLEXTOUCH) 200 UNIT/ML FlexTouch Pen Inject 80 Units into the skin 2 (two) times daily.     insulin lispro (HUMALOG KWIKPEN) 100 UNIT/ML KwikPen Inject 15-20 Units into the skin 3 (three) times daily with meals. 15 mL 11   isosorbide mononitrate (IMDUR) 30 MG 24 hr tablet Take 1 tablet by mouth once  daily 90 tablet 0   linaclotide (LINZESS) 72 MCG capsule Take 1 capsule (72 mcg total) by mouth daily as needed (constipation). 90 capsule 3   Menthol, Topical Analgesic, (BLUE-EMU MAXIMUM STRENGTH EX) Apply 1 application topically daily as needed (back pain).     metoprolol succinate (TOPROL-XL) 50 MG 24 hr tablet Take 1 tablet by mouth once daily 90 tablet 3   metoprolol tartrate (LOPRESSOR) 50 MG tablet Take 50 mg by mouth daily as needed (a-fib).     nitroGLYCERIN (NITROSTAT) 0.4 MG SL tablet DISSOLVE ONE TABLET UNDER THE TONGUE EVERY 5 MINUTES AS NEEDED FOR CHEST PAIN.  DO NOT EXCEED A TOTAL OF 3 DOSES IN 15 MINUTES 25 tablet 0   pantoprazole (PROTONIX) 40 MG tablet Take 1 tablet by mouth once daily 90 tablet 1   Semaglutide, 1 MG/DOSE, (OZEMPIC, 1 MG/DOSE,) 2 MG/1.5ML SOPN Inject 0.75 mLs (1 mg total) into the skin once a week. 12 mL 4   triamcinolone (NASACORT) 55 MCG/ACT AERO nasal inhaler Place 1 spray into the nose as needed (allergies).     vitamin B-12 (CYANOCOBALAMIN) 1000 MCG tablet Take 1,000 mcg by mouth daily.     No current facility-administered medications for this visit.    REVIEW OF SYSTEMS:   [X]  denotes positive finding, [ ]  denotes negative finding Cardiac  Comments:  Chest pain or chest pressure:    Shortness of breath upon exertion:    Short of breath when lying flat:    Irregular heart rhythm:        Vascular    Pain in calf, thigh, or hip brought on by ambulation:    Pain in feet at night that wakes you up from your sleep:     Blood clot in your veins:    Leg swelling:         Pulmonary    Oxygen at home:    Productive cough:     Wheezing:         Neurologic     Sudden weakness in arms or legs:     Sudden numbness in arms or legs:     Sudden onset of difficulty speaking or slurred speech:    Temporary loss of vision in one eye:     Problems with dizziness:         Gastrointestinal    Blood in stool:     Vomited blood:         Genitourinary    Burning when urinating:     Blood in urine:        Psychiatric    Major depression:         Hematologic    Bleeding problems:    Problems with blood clotting too easily:        Skin    Rashes or ulcers:        Constitutional    Fever or chills:      PHYSICAL EXAM:   Vitals:   11/04/20 1105  BP: 138/83  Pulse: 76  Resp: 20  Temp: 98.1 F (36.7 C)  SpO2: 95%  Weight: 286 lb (129.7 kg)  Height: 6\' 2"  (1.88 m)    GENERAL: The patient is a well-nourished male, in no acute distress. The vital signs are documented above. CARDIAC: There is a regular rate and rhythm.  VASCULAR: Nonpalpable pedal pulses PULMONARY: Non-labored respirations ABDOMEN: Soft and non-tender with normal pitched bowel sounds.  MUSCULOSKELETAL: There are no major deformities or cyanosis. NEUROLOGIC: No focal  weakness or paresthesias are detected. SKIN: See photo below PSYCHIATRIC: The patient has a normal affect.   STUDIES:   I have reviewed his ultrasound studies with the following findings: Right: 50-74% stenosis noted in the superficial femoral artery. Stenosis  is noted within the proximal stent.  Peak velocity is 288 cm/s  +-------+-----------+-----------+------------+------------+  ABI/TBIToday's ABIToday's TBIPrevious ABIPrevious TBI  +-------+-----------+-----------+------------+------------+  Right  0.79       0.54       0.72        0.65          +-------+-----------+-----------+------------+------------+  Left   0.68       0.67       0.72        0.55          +-------+-----------+-----------+------------+------------+          MEDICAL ISSUES:   Bilateral lower  extremity vascular disease with ulceration: Most recent duplex suggests a stenosis within the right leg stent, however velocities are less than 300 cm/s.  He has a known left superficial femoral artery occlusion.  I was unable to cross this and surgery was recommended however the patient was able to tolerate his symptoms back then which were just claudication.  Now he has a ulcer.  I think this is now a limb threatening situation.  I want to proceed with angiography with bilateral runoff via a right femoral approach.  I will attempt to recanalize the occlusion of the left superficial femoral artery I will need images of both legs.  If I cannot cross the lesion he would most likely need left leg bypass.  I have scheduled this for Tuesday, June 28.  I will stop his Eliquis prior to the procedure.    Leia Alf, MD, FACS [facility] Specialists of [facility] [phone] Pager [phone]

## 2020-11-05 ENCOUNTER — Telehealth: Payer: Self-pay | Admitting: Pharmacist

## 2020-11-05 ENCOUNTER — Telehealth: Payer: Self-pay | Admitting: Family

## 2020-11-05 ENCOUNTER — Other Ambulatory Visit: Payer: Self-pay | Admitting: Family

## 2020-11-05 DIAGNOSIS — E1159 Type 2 diabetes mellitus with other circulatory complications: Secondary | ICD-10-CM

## 2020-11-05 NOTE — Telephone Encounter (Signed)
Cpht to call patient with info RN no f/u needed

## 2020-11-05 NOTE — Telephone Encounter (Signed)
We need their most recent forms-2022 Husband aware

## 2020-11-05 NOTE — Telephone Encounter (Signed)
For Ozempic and insulin patient assistance ,the company needs the patient's most recent 2022 social security letter.  He gave me 2021.  If he doesn't have--he can bring bank statement with his and spouse's income (must be most recent, have bank name, date, etc)  We need the information ASAP.  Will be another 2-3 weeks after we fax for medication to ship.  He can call to see if we have samples in the coming week

## 2020-11-05 NOTE — Telephone Encounter (Signed)
Pt's spouse is aware they need most recent forms.

## 2020-11-05 NOTE — Telephone Encounter (Signed)
    [name] [name] DOB:  Nov 23, 1950  MRN:  [id number]   Primary Cardiologist: Jenkins Rouge, MD  Chart reviewed as part of pre-operative protocol coverage. Patient was recently seen by Dr. Lovena Le on 10/16/2020 at which time he was doing well with no palpitations or any anginal complaints. Given past medical history and time since last visit, based on ACC/AHA guidelines, [name] [name] would be at acceptable risk for the planned procedure without further cardiovascular testing.   Per Dr. [name], [place] to hold Eliquis for 3 days prior to vascular procedure. Please restart this as soon as able following procedure.   I will route this recommendation to the requesting party via Epic fax function and remove from pre-op pool.  Please call with questions.  Darreld Mclean, PA-C 11/05/2020, 9:10 AM

## 2020-11-06 NOTE — Telephone Encounter (Signed)
Humalog pen called in for patient

## 2020-11-12 ENCOUNTER — Ambulatory Visit (HOSPITAL_COMMUNITY)
Admission: RE | Admit: 2020-11-12 | Discharge: 2020-11-12 | Disposition: A | Discharge status: Home / Self Care | Payer: Medicare Other | Attending: Surgery | Admitting: Surgery

## 2020-11-12 ENCOUNTER — Ambulatory Visit (HOSPITAL_COMMUNITY)
Admission: RE | Disposition: A | Discharge status: Home / Self Care | Payer: Self-pay | Source: Home / Self Care | Attending: Surgery

## 2020-11-12 ENCOUNTER — Other Ambulatory Visit: Payer: Self-pay

## 2020-11-12 ENCOUNTER — Encounter: Payer: Medicare Other | Admitting: *Deleted

## 2020-11-12 DIAGNOSIS — E11621 Type 2 diabetes mellitus with foot ulcer: Secondary | ICD-10-CM | POA: Insufficient documentation

## 2020-11-12 DIAGNOSIS — Z888 Allergy status to other drugs, medicaments and biological substances status: Secondary | ICD-10-CM | POA: Insufficient documentation

## 2020-11-12 DIAGNOSIS — E1151 Type 2 diabetes mellitus with diabetic peripheral angiopathy without gangrene: Secondary | ICD-10-CM | POA: Insufficient documentation

## 2020-11-12 DIAGNOSIS — E7800 Pure hypercholesterolemia, unspecified: Secondary | ICD-10-CM | POA: Diagnosis not present

## 2020-11-12 DIAGNOSIS — L97529 Non-pressure chronic ulcer of other part of left foot with unspecified severity: Secondary | ICD-10-CM | POA: Insufficient documentation

## 2020-11-12 DIAGNOSIS — Z91013 Allergy to seafood: Secondary | ICD-10-CM | POA: Insufficient documentation

## 2020-11-12 DIAGNOSIS — Z7902 Long term (current) use of antithrombotics/antiplatelets: Secondary | ICD-10-CM | POA: Diagnosis not present

## 2020-11-12 DIAGNOSIS — I2510 Atherosclerotic heart disease of native coronary artery without angina pectoris: Secondary | ICD-10-CM | POA: Diagnosis not present

## 2020-11-12 DIAGNOSIS — Z883 Allergy status to other anti-infective agents status: Secondary | ICD-10-CM | POA: Diagnosis not present

## 2020-11-12 DIAGNOSIS — Z006 Encounter for examination for normal comparison and control in clinical research program: Secondary | ICD-10-CM

## 2020-11-12 DIAGNOSIS — Z882 Allergy status to sulfonamides status: Secondary | ICD-10-CM | POA: Diagnosis not present

## 2020-11-12 DIAGNOSIS — Z794 Long term (current) use of insulin: Secondary | ICD-10-CM | POA: Insufficient documentation

## 2020-11-12 DIAGNOSIS — Z7901 Long term (current) use of anticoagulants: Secondary | ICD-10-CM | POA: Diagnosis not present

## 2020-11-12 DIAGNOSIS — I70235 Atherosclerosis of native arteries of right leg with ulceration of other part of foot: Secondary | ICD-10-CM | POA: Diagnosis not present

## 2020-11-12 DIAGNOSIS — I70245 Atherosclerosis of native arteries of left leg with ulceration of other part of foot: Secondary | ICD-10-CM | POA: Insufficient documentation

## 2020-11-12 DIAGNOSIS — Z87891 Personal history of nicotine dependence: Secondary | ICD-10-CM | POA: Insufficient documentation

## 2020-11-12 DIAGNOSIS — I4891 Unspecified atrial fibrillation: Secondary | ICD-10-CM | POA: Diagnosis not present

## 2020-11-12 DIAGNOSIS — Z79899 Other long term (current) drug therapy: Secondary | ICD-10-CM | POA: Insufficient documentation

## 2020-11-12 DIAGNOSIS — L97519 Non-pressure chronic ulcer of other part of right foot with unspecified severity: Secondary | ICD-10-CM | POA: Insufficient documentation

## 2020-11-12 HISTORY — PX: ABDOMINAL AORTOGRAM W/LOWER EXTREMITY: CATH118223

## 2020-11-12 HISTORY — PX: PERIPHERAL VASCULAR BALLOON ANGIOPLASTY: CATH118281

## 2020-11-12 LAB — POCT ACTIVATED CLOTTING TIME
Activated Clotting Time: 242 seconds
Activated Clotting Time: 190 seconds
Activated Clotting Time: 167 seconds

## 2020-11-12 LAB — GLUCOSE, CAPILLARY
Glucose-Capillary: 96 mg/dL (ref 70–99)
Glucose-Capillary: 86 mg/dL (ref 70–99)

## 2020-11-12 LAB — POCT I-STAT, CHEM 8
BUN: 26 mg/dL — ABNORMAL HIGH (ref 8–23)
Calcium, Ion: 1.23 mmol/L (ref 1.15–1.40)
Chloride: 97 mmol/L — ABNORMAL LOW (ref 98–111)
Creatinine, Ser: 1.4 mg/dL — ABNORMAL HIGH (ref 0.61–1.24)
Glucose, Bld: 146 mg/dL — ABNORMAL HIGH (ref 70–99)
HCT: 44 % (ref 39.0–52.0)
Hemoglobin: 15 g/dL (ref 13.0–17.0)
Potassium: 4.4 mmol/L (ref 3.5–5.1)
Sodium: 138 mmol/L (ref 135–145)
TCO2: 32 mmol/L (ref 22–32)

## 2020-11-12 SURGERY — ABDOMINAL AORTOGRAM W/LOWER EXTREMITY
Anesthesia: LOCAL

## 2020-11-12 MED ORDER — SODIUM CHLORIDE 0.9% FLUSH: 3.0000 mL | INTRAVENOUS | Status: DC | PRN

## 2020-11-12 MED ORDER — ACETAMINOPHEN 325 MG PO TABS: 650.0000 mg | ORAL_TABLET | ORAL | Status: DC | PRN

## 2020-11-12 MED ORDER — IODIXANOL 320 MG/ML IV SOLN
INTRAVENOUS | Status: DC | PRN
  Administered 2020-11-12: 150 mL via INTRA_ARTERIAL

## 2020-11-12 MED ORDER — HEPARIN (PORCINE) IN NACL 1000-0.9 UT/500ML-% IV SOLN
INTRAVENOUS | Status: AC
  Filled 2020-11-12: qty 1000

## 2020-11-12 MED ORDER — MIDAZOLAM HCL 5 MG/5ML IJ SOLN
INTRAMUSCULAR | Status: AC
  Filled 2020-11-12: qty 5

## 2020-11-12 MED ORDER — SODIUM CHLORIDE 0.9 % IV SOLN: 250.0000 mL | INTRAVENOUS | Status: DC | PRN

## 2020-11-12 MED ORDER — HYDRALAZINE HCL 20 MG/ML IJ SOLN: 5.0000 mg | INTRAMUSCULAR | Status: DC | PRN

## 2020-11-12 MED ORDER — LIDOCAINE HCL (PF) 1 % IJ SOLN
INTRAMUSCULAR | Status: AC
  Filled 2020-11-12: qty 30

## 2020-11-12 MED ORDER — SODIUM CHLORIDE 0.9% FLUSH: 3.0000 mL | Freq: Two times a day (BID) | INTRAVENOUS | Status: DC

## 2020-11-12 MED ORDER — MORPHINE SULFATE (PF) 2 MG/ML IV SOLN
2.0000 mg | INTRAVENOUS | Status: DC | PRN
  Administered 2020-11-12: 2 mg via INTRAVENOUS
  Filled 2020-11-12: qty 1

## 2020-11-12 MED ORDER — SODIUM CHLORIDE 0.9 % IV SOLN: INTRAVENOUS | Status: DC

## 2020-11-12 MED ORDER — SODIUM CHLORIDE 0.9 % WEIGHT BASED INFUSION: 1.0000 mL/kg/h | INTRAVENOUS | Status: DC

## 2020-11-12 MED ORDER — ONDANSETRON HCL 4 MG/2ML IJ SOLN: 4.0000 mg | Freq: Four times a day (QID) | INTRAMUSCULAR | Status: DC | PRN

## 2020-11-12 MED ORDER — FENTANYL CITRATE (PF) 100 MCG/2ML IJ SOLN
INTRAMUSCULAR | Status: DC | PRN
  Administered 2020-11-12 (×2): 50 ug via INTRAVENOUS

## 2020-11-12 MED ORDER — MIDAZOLAM HCL 2 MG/2ML IJ SOLN
INTRAMUSCULAR | Status: DC | PRN
  Administered 2020-11-12: 2 mg via INTRAVENOUS

## 2020-11-12 MED ORDER — HEPARIN (PORCINE) IN NACL 2000-0.9 UNIT/L-% IV SOLN
INTRAVENOUS | Status: DC | PRN
  Administered 2020-11-12: 10000 mL

## 2020-11-12 MED ORDER — OXYCODONE HCL 5 MG PO TABS: 5.0000 mg | ORAL_TABLET | ORAL | Status: DC | PRN

## 2020-11-12 MED ORDER — LABETALOL HCL 5 MG/ML IV SOLN
INTRAVENOUS | Status: AC
  Filled 2020-11-12: qty 4

## 2020-11-12 MED ORDER — HEPARIN (PORCINE) IN NACL 1000-0.9 UT/500ML-% IV SOLN
INTRAVENOUS | Status: DC | PRN
  Administered 2020-11-12 (×2): 500 mL

## 2020-11-12 MED ORDER — HEPARIN SODIUM (PORCINE) 1000 UNIT/ML IJ SOLN
INTRAMUSCULAR | Status: AC
  Filled 2020-11-12: qty 1

## 2020-11-12 MED ORDER — LABETALOL HCL 5 MG/ML IV SOLN
10.0000 mg | INTRAVENOUS | Status: DC | PRN
  Administered 2020-11-12: 10 mg via INTRAVENOUS

## 2020-11-12 MED ORDER — FENTANYL CITRATE (PF) 100 MCG/2ML IJ SOLN
INTRAMUSCULAR | Status: AC
  Filled 2020-11-12: qty 2

## 2020-11-12 MED ORDER — LIDOCAINE HCL (PF) 1 % IJ SOLN
INTRAMUSCULAR | Status: DC | PRN
  Administered 2020-11-12: 20 mL

## 2020-11-12 SURGICAL SUPPLY — 17 items
CATH OMNI FLUSH 5F 65CM (CATHETERS) ×1 IMPLANT
CATH QUICKCROSS SUPP .035X90CM (MICROCATHETER) ×1 IMPLANT
CATH SOFT-VU ST 4F 90CM (CATHETERS) ×1 IMPLANT
DEVICE CONTINUOUS FLUSH (MISCELLANEOUS) ×2 IMPLANT
DEVICE TORQUE H2O (MISCELLANEOUS) ×1 IMPLANT
GLIDEWIRE ADV .035X260CM (WIRE) ×1 IMPLANT
GUIDEWIRE ANGLED .035X150CM (WIRE) ×1 IMPLANT
KIT MICROPUNCTURE NIT STIFF (SHEATH) ×1 IMPLANT
KIT PV (KITS) ×3 IMPLANT
SHEATH PINNACLE 5F 10CM (SHEATH) ×1 IMPLANT
SHEATH PINNACLE ST 6F 45CM (SHEATH) ×1 IMPLANT
SHEATH PROBE COVER 6X72 (BAG) ×1 IMPLANT
SHIELD RADPAD SCOOP 12X17 (MISCELLANEOUS) ×1 IMPLANT
SYR MEDRAD MARK 7 150ML (SYRINGE) ×3 IMPLANT
TRANSDUCER W/STOPCOCK (MISCELLANEOUS) ×3 IMPLANT
TRAY PV CATH (CUSTOM PROCEDURE TRAY) ×3 IMPLANT
WIRE BENTSON .035X145CM (WIRE) ×1 IMPLANT

## 2020-11-12 NOTE — Addendum Note (Signed)
Addended by: Sandie Ano D on: 11/12/2020 02:11 PM   Modules accepted: Orders

## 2020-11-12 NOTE — Discharge Instructions (Signed)
° °  [facility]ists of [place] ° °Discharge Instructions ° °Lower Extremity Angiogram; Angioplasty/Stenting ° °Please refer to the following instructions for your post-procedure care. Your surgeon or physician assistant will discuss any changes with you. ° °Activity ° °Avoid lifting more than 8 pounds (1 gallons of milk) for 72 hours (3 days) after your procedure. You may walk as much as you can tolerate. It's OK to drive after 72 hours. ° °Bathing/Showering ° °You may shower the day after your procedure. If you have a bandage, you may remove it at 24- 48 hours. Clean your incision site with mild soap and water. Pat the area dry with a clean towel. ° °Diet ° °Resume your pre-procedure diet. There are no special food restrictions following this procedure. All patients with peripheral vascular disease should follow a low fat/low cholesterol diet. In order to heal from your surgery, it is CRITICAL to get adequate nutrition. Your body requires vitamins, minerals, and protein. Vegetables are the best source of vitamins and minerals. Vegetables also provide the perfect balance of protein. Processed food has little nutritional value, so try to avoid this. ° °Medications ° °Resume taking all of your medications unless your doctor tells you not to. If your incision is causing pain, you may take over-the-counter pain relievers such as acetaminophen (Tylenol) ° °Follow Up ° °Follow up will be arranged at the time of your procedure. You may have an office visit scheduled or may be scheduled for surgery. Ask your surgeon if you have any questions. ° °Please call us immediately for any of the following conditions: °•Severe or worsening pain your legs or feet at rest or with walking. °•Increased pain, redness, drainage at your groin puncture site. °•Fever of 101 degrees or higher. °•If you have any mild or slow bleeding from your puncture site: lie down, apply firm constant pressure over the area with a piece of  gauze or a clean wash cloth for 30 minutes- no peeking!, call 911 right away if you are still bleeding after 30 minutes, or if the bleeding is heavy and unmanageable. ° °Reduce your risk factors of vascular disease: ° °Stop smoking. If you would like help call [facility] at 1-800-QUIT-NOW (1-800-784-8669) or [facility] at 336-586-4000. °Manage your cholesterol °Maintain a desired weight °Control your diabetes °Keep your blood pressure down ° °If you have any questions, please call the office at [phone] ° °

## 2020-11-12 NOTE — Interval H&P Note (Signed)
History and Physical Interval Note:  11/12/2020 8:58 AM  [name] [name]  has presented today for surgery, with the diagnosis of pvd w/ ulcer.  The various methods of treatment have been discussed with the patient and family. After consideration of risks, benefits and other options for treatment, the patient has consented to  Procedure(s): ABDOMINAL AORTOGRAM W/LOWER EXTREMITY (N/A) as a surgical intervention.  The patient's history has been reviewed, patient examined, no change in status, stable for surgery.  I have reviewed the patient's chart and labs.  Questions were answered to the patient's satisfaction.     Annamarie Major

## 2020-11-12 NOTE — Progress Notes (Signed)
60fr sheath aspirated and removed from rfa. Manual pressure applied for 20 minutes. Site level 0 no s+s of hematoma. Tegaderm dressing applied, bedrest instructions given.   Bilateral dp and pt pulses present with doppler.   Bedrest begins at 13:15:00

## 2020-11-12 NOTE — Op Note (Signed)
Patient name: [name] [name] MRN: [id number] DOB: 24-Apr-1951 Sex: male  11/12/2020 Pre-operative Diagnosis: Bilateral lower extremity ulcers Post-operative diagnosis:  Same Surgeon:  Annamarie Major Procedure Performed:  1.  Ultrasound-guided access, right femoral artery  2.  Abdominal aortogram  3.  Bilateral lower extremity runoff  4.  Second-order catheterization  5.  Failed angioplasty, left superficial femoral artery  6.  Conscious sedation, 58 minutes   Indications: This is a 70 year old gentleman who was previously undergone stenting of his right superficial femoral artery for ulcer disease.  He has new ulcer on his left leg.  He comes in for arteriogram  Procedure:  The patient was identified in the holding area and taken to room 8.  The patient was then placed supine on the table and prepped and draped in the usual sterile fashion.  A time out was called.  Conscious sedation was administered with the use of IV fentanyl and Versed under continuous physician and nurse monitoring.  Heart rate, blood pressure, and oxygen saturation were continuously monitored.  Total sedation time was 58 minutes.  Ultrasound was used to evaluate the right common femoral artery.  It was patent .  A digital ultrasound image was acquired.  A micropuncture needle was used to access the right common femoral artery under ultrasound guidance.  An 018 wire was advanced without resistance and a micropuncture sheath was placed.  The 018 wire was removed and a benson wire was placed.  The micropuncture sheath was exchanged for a 5 french sheath.  An omniflush catheter was advanced over the wire to the level of L-1.  An abdominal angiogram was obtained.  Next, using the omniflush catheter and a benson wire, the aortic bifurcation was crossed and the catheter was placed into theleft external iliac artery and left runoff was obtained.  right runoff was performed via retrograde sheath injections.  Findings:   Aortogram: No  significant renal stenosis was identified.  The infrarenal abdominal aorta was patent without significant stenosis but heavily calcified.  No significant iliac lesions were identified bilaterally  Right Lower Extremity: The right common femoral and profundofemoral artery were patent throughout the course.  Superficial femoral artery remains patent as does the previously placed stents.  There is approximately 60% stenosis at the proximal and distal edge of the stents.  The popliteal artery is patent throughout its course.  The dominant runoff is the peroneal artery, however this does appear to have a short segment occlusion at its origin.  Left Lower Extremity: The left common femoral and profundofemoral artery are widely patent.  The superficial femoral artery is occluded with reconstitution in the adductor canal.  The popliteal artery s/p course with peroneal runoff  Intervention: After the above images were acquired the decision was made to proceed with intervention.  A 6 French sheath was placed into the left external iliac artery and the patient was fully heparinized.  Using an [place] and quick cross catheter and attempted subintimal recanalization was performed.  Ultimately, this was unsuccessful.  I elected to terminate the procedure and plan for surgical revascularization.  Impression:  #1  Approximate 60% stenosis at the proximal and distal edge of the right superficial femoral artery stents.  #2  Single-vessel runoff via the peroneal on the right however the proximal peroneal artery is occluded for a short distance.  #3  Consideration for right leg angiogram and intervention  #4  Left superficial femoral artery occlusion which could not be recanalized.  There is  single-vessel runoff via the peroneal artery  #5  Patient will be scheduled for left femoral to above-knee popliteal artery bypass graft   V. Annamarie Major, M.D., [facility] of [place] Office:  [phone] Pager:  [phone]

## 2020-11-12 NOTE — Research (Signed)
I saw patient for end of study visit for Clear Study. EKG and labs were done per protocol. Dr Lia Foyer saw patient for study physical. Study medication was returned by patient and patient was 100% compliance. Patient will be coming off the study medication today. I will call patient in 30 days for post study phone call.

## 2020-11-12 NOTE — Progress Notes (Signed)
Pt ambulated without difficulty or bleeding.   Discharged home with his wife who will drive and stay with pt x 24 hrs. 

## 2020-11-13 ENCOUNTER — Other Ambulatory Visit: Payer: Self-pay

## 2020-11-13 ENCOUNTER — Encounter (HOSPITAL_COMMUNITY): Payer: Self-pay | Admitting: Surgery

## 2020-11-13 MED FILL — Heparin Sodium (Porcine) Inj 1000 Unit/ML: INTRAMUSCULAR | Qty: 10 | Status: AC

## 2020-11-13 MED FILL — Midazolam HCl Inj 5 MG/5ML (Base Equivalent): INTRAMUSCULAR | Qty: 2 | Status: AC

## 2020-11-18 ENCOUNTER — Other Ambulatory Visit: Payer: Self-pay | Admitting: Family

## 2020-11-18 DIAGNOSIS — E785 Hyperlipidemia, unspecified: Secondary | ICD-10-CM

## 2020-11-19 ENCOUNTER — Encounter: Payer: Self-pay | Admitting: Family

## 2020-11-19 ENCOUNTER — Ambulatory Visit (INDEPENDENT_AMBULATORY_CARE_PROVIDER_SITE_OTHER): Payer: Medicare Other | Admitting: Family

## 2020-11-19 ENCOUNTER — Other Ambulatory Visit: Payer: Self-pay

## 2020-11-19 VITALS — BP 157/67 | HR 67 | Temp 96.0°F | Ht 74.0 in | Wt 286.0 lb

## 2020-11-19 DIAGNOSIS — Z794 Long term (current) use of insulin: Secondary | ICD-10-CM | POA: Diagnosis not present

## 2020-11-19 DIAGNOSIS — K219 Gastro-esophageal reflux disease without esophagitis: Secondary | ICD-10-CM | POA: Diagnosis not present

## 2020-11-19 DIAGNOSIS — F419 Anxiety disorder, unspecified: Secondary | ICD-10-CM

## 2020-11-19 DIAGNOSIS — E1159 Type 2 diabetes mellitus with other circulatory complications: Secondary | ICD-10-CM

## 2020-11-19 DIAGNOSIS — Z9861 Coronary angioplasty status: Secondary | ICD-10-CM

## 2020-11-19 DIAGNOSIS — I509 Heart failure, unspecified: Secondary | ICD-10-CM

## 2020-11-19 DIAGNOSIS — E1169 Type 2 diabetes mellitus with other specified complication: Secondary | ICD-10-CM

## 2020-11-19 DIAGNOSIS — I2510 Atherosclerotic heart disease of native coronary artery without angina pectoris: Secondary | ICD-10-CM

## 2020-11-19 DIAGNOSIS — I152 Hypertension secondary to endocrine disorders: Secondary | ICD-10-CM

## 2020-11-19 DIAGNOSIS — I480 Paroxysmal atrial fibrillation: Secondary | ICD-10-CM | POA: Diagnosis not present

## 2020-11-19 DIAGNOSIS — E785 Hyperlipidemia, unspecified: Secondary | ICD-10-CM

## 2020-11-19 DIAGNOSIS — E1142 Type 2 diabetes mellitus with diabetic polyneuropathy: Secondary | ICD-10-CM | POA: Diagnosis not present

## 2020-11-19 LAB — BAYER DCA HB A1C WAIVED: HB A1C (BAYER DCA - WAIVED): 7.6 % — ABNORMAL HIGH (ref <7.0)

## 2020-11-19 NOTE — Progress Notes (Signed)
Subjective:    Patient ID: [name] [name], male    DOB: Oct 10, 1950, 70 y.o.   MRN: [id number]  Chief Complaint  Patient presents with   Medical Management of Chronic Issues   PT presents to the office today for chronic follow up. PT had NSTEMI on 07/30/16  and 05/23/20 and stent placed. Pt is followed by Cardiologists every 4 months. Pt currently in a clinical trial for hyperlipidemia. He is followed by Ortho for osteoarthritis and is currently getting injections in bilateral knees and back. Pt is followed by Pain Management every 2-3 months. Followed by Nephrologists every 3 months for CKD.    He is followed by Vascular and had stent placed in his right leg.    He is scheduled for a left femoral-popliteal  bypass on 11/22/20. Hypertension This is a chronic problem. The current episode started more than 1 year ago. The problem has been resolved since onset. The problem is controlled. Associated symptoms include blurred vision, malaise/fatigue and peripheral edema. Pertinent negatives include no shortness of breath. Risk factors for coronary artery disease include dyslipidemia, diabetes mellitus, obesity and male gender. The current treatment provides moderate improvement.  Diabetes He presents for his follow-up diabetic visit. He has type 2 diabetes mellitus. There are no hypoglycemic associated symptoms. Associated symptoms include blurred vision and foot paresthesias. Symptoms are stable. Diabetic complications include heart disease, nephropathy and peripheral neuropathy. Risk factors for coronary artery disease include dyslipidemia, diabetes mellitus, male sex, hypertension and sedentary lifestyle.  Gastroesophageal Reflux He complains of belching and heartburn. This is a chronic problem. The current episode started more than 1 year ago. The problem occurs occasionally. Risk factors include obesity. He has tried a PPI for the symptoms.  Hyperlipidemia This is a chronic problem. The current  episode started more than 1 year ago. The problem is controlled. Exacerbating diseases include obesity. Pertinent negatives include no shortness of breath. Current antihyperlipidemic treatment includes statins. The current treatment provides moderate improvement of lipids. Risk factors for coronary artery disease include dyslipidemia, diabetes mellitus, male sex, hypertension and a sedentary lifestyle.     Review of Systems  Constitutional:  Positive for malaise/fatigue.  Eyes:  Positive for blurred vision.  Respiratory:  Negative for shortness of breath.   Gastrointestinal:  Positive for heartburn.  All other systems reviewed and are negative.     Objective:   Physical Exam Vitals reviewed.  Constitutional:      General: He is not in acute distress.    Appearance: He is well-developed. He is obese.  HENT:     Head: Normocephalic.     Right Ear: Tympanic membrane normal.     Left Ear: Tympanic membrane normal.  Eyes:     General:        Right eye: No discharge.        Left eye: No discharge.     Pupils: Pupils are equal, round, and reactive to light.  Neck:     Thyroid: No thyromegaly.  Cardiovascular:     Rate and Rhythm: Normal rate and regular rhythm.     Heart sounds: Normal heart sounds. No murmur heard. Pulmonary:     Effort: Pulmonary effort is normal. No respiratory distress.     Breath sounds: Normal breath sounds. No wheezing.  Abdominal:     General: Bowel sounds are normal. There is no distension.     Palpations: Abdomen is soft.     Tenderness: There is no abdominal tenderness.  Musculoskeletal:  General: No tenderness. Normal range of motion.     Cervical back: Normal range of motion and neck supple.  Skin:    General: Skin is warm and dry.     Findings: No erythema or rash.  Neurological:     Mental Status: He is alert and oriented to person, place, and time.     Cranial Nerves: No cranial nerve deficit.     Deep Tendon Reflexes: Reflexes are  normal and symmetric.  Psychiatric:        Behavior: Behavior normal.        Thought Content: Thought content normal.        Judgment: Judgment normal.      BP (!) 165/72   Pulse 76   Temp (!) 96 F (35.6 C) (Temporal)   Ht 6\' 2"  (1.88 m)   Wt 286 lb (129.7 kg)   SpO2 100%   BMI 36.72 kg/m      Assessment & Plan:  [name] [name] comes in today with chief complaint of Medical Management of Chronic Issues   Diagnosis and orders addressed:  1. Congestive heart failure, unspecified HF chronicity, unspecified heart failure type ([place])  2. Hypertension associated with diabetes ([place])  3. PAF (paroxysmal atrial fibrillation) ([place])  4. CAD S/P percutaneous coronary angioplasty  5. Gastroesophageal reflux disease without esophagitis  6. Type 2 diabetes mellitus with diabetic polyneuropathy, with long-term current use of insulin ([facility]) - Bayer DCA Hb A1c Waived - Microalbumin / creatinine urine ratio  7. Hyperlipidemia associated with type 2 diabetes mellitus ([place])  8. Morbid obesity ([place])  9. Anxiety   Labs pending Health Maintenance reviewed Diet and exercise encouraged  Follow up plan: 3 months    [name], FNP

## 2020-11-19 NOTE — Patient Instructions (Signed)
Diabetes Mellitus and Nutrition, Adult When you have diabetes, or diabetes mellitus, it is very important to have healthy eating habits because your blood sugar (glucose) levels are greatly affected by what you eat and drink. Eating healthy foods in the right amounts, at about the same times every day, can help you:  Control your blood glucose.  Lower your risk of heart disease.  Improve your blood pressure.  Reach or maintain a healthy weight. What can affect my meal plan? Every person with diabetes is different, and each person has different needs for a meal plan. Your health care provider may recommend that you work with a dietitian to make a meal plan that is best for you. Your meal plan may vary depending on factors such as:  The calories you need.  The medicines you take.  Your weight.  Your blood glucose, blood pressure, and cholesterol levels.  Your activity level.  Other health conditions you have, such as heart or kidney disease. How do carbohydrates affect me? Carbohydrates, also called carbs, affect your blood glucose level more than any other type of food. Eating carbs naturally raises the amount of glucose in your blood. Carb counting is a method for keeping track of how many carbs you eat. Counting carbs is important to keep your blood glucose at a healthy level, especially if you use insulin or take certain oral diabetes medicines. It is important to know how many carbs you can safely have in each meal. This is different for every person. Your dietitian can help you calculate how many carbs you should have at each meal and for each snack. How does alcohol affect me? Alcohol can cause a sudden decrease in blood glucose (hypoglycemia), especially if you use insulin or take certain oral diabetes medicines. Hypoglycemia can be a life-threatening condition. Symptoms of hypoglycemia, such as sleepiness, dizziness, and confusion, are similar to symptoms of having too much  alcohol.  Do not drink alcohol if: ? Your health care provider tells you not to drink. ? You are pregnant, may be pregnant, or are planning to become pregnant.  If you drink alcohol: ? Do not drink on an empty stomach. ? Limit how much you use to:  0-1 drink a day for women.  0-2 drinks a day for men. ? Be aware of how much alcohol is in your drink. In the [place] one drink equals one 12 oz bottle of beer (355 mL), one 5 oz glass of wine (148 mL), or one 1 oz glass of hard liquor (44 mL). ? Keep yourself hydrated with water, diet soda, or unsweetened iced tea.  Keep in mind that regular soda, juice, and other mixers may contain a lot of sugar and must be counted as carbs. What are tips for following this plan? Reading food labels  Start by checking the serving size on the "Nutrition Facts" label of packaged foods and drinks. The amount of calories, carbs, fats, and other nutrients listed on the label is based on one serving of the item. Many items contain more than one serving per package.  Check the total grams (g) of carbs in one serving. You can calculate the number of servings of carbs in one serving by dividing the total carbs by 15. For example, if a food has 30 g of total carbs per serving, it would be equal to 2 servings of carbs.  Check the number of grams (g) of saturated fats and trans fats in one serving. Choose foods that have   a low amount or none of these fats.  Check the number of milligrams (mg) of salt (sodium) in one serving. Most people should limit total sodium intake to less than 2,300 mg per day.  Always check the nutrition information of foods labeled as "low-fat" or "nonfat." These foods may be higher in added sugar or refined carbs and should be avoided.  Talk to your dietitian to identify your daily goals for nutrients listed on the label. Shopping  Avoid buying canned, pre-made, or processed foods. These foods tend to be high in fat, sodium, and added  sugar.  Shop around the outside edge of the grocery store. This is where you will most often find fresh fruits and vegetables, bulk grains, fresh meats, and fresh dairy. Cooking  Use low-heat cooking methods, such as baking, instead of high-heat cooking methods like deep frying.  Cook using healthy oils, such as olive, canola, or sunflower oil.  Avoid cooking with butter, cream, or high-fat meats. Meal planning  Eat meals and snacks regularly, preferably at the same times every day. Avoid going long periods of time without eating.  Eat foods that are high in fiber, such as fresh fruits, vegetables, beans, and whole grains. Talk with your dietitian about how many servings of carbs you can eat at each meal.  Eat 4-6 oz (112-168 g) of lean protein each day, such as lean meat, chicken, fish, eggs, or tofu. One ounce (oz) of lean protein is equal to: ? 1 oz (28 g) of meat, chicken, or fish. ? 1 egg. ?  cup (62 g) of tofu.  Eat some foods each day that contain healthy fats, such as avocado, nuts, seeds, and fish.   What foods should I eat? Fruits Berries. Apples. Oranges. Peaches. Apricots. Plums. Grapes. Mango. Papaya. Pomegranate. Kiwi. Cherries. Vegetables Lettuce. Spinach. Leafy greens, including kale, chard, collard greens, and mustard greens. Beets. Cauliflower. Cabbage. Broccoli. Carrots. Green beans. Tomatoes. Peppers. Onions. Cucumbers. Brussels sprouts. Grains Whole grains, such as whole-wheat or whole-grain bread, crackers, tortillas, cereal, and pasta. Unsweetened oatmeal. Quinoa. Brown or wild rice. Meats and other proteins Seafood. Poultry without skin. Lean cuts of poultry and beef. Tofu. Nuts. Seeds. Dairy Low-fat or fat-free dairy products such as milk, yogurt, and cheese. The items listed above may not be a complete list of foods and beverages you can eat. Contact a dietitian for more information. What foods should I avoid? Fruits Fruits canned with  syrup. Vegetables Canned vegetables. Frozen vegetables with butter or cream sauce. Grains Refined white flour and flour products such as bread, pasta, snack foods, and cereals. Avoid all processed foods. Meats and other proteins Fatty cuts of meat. Poultry with skin. Breaded or fried meats. Processed meat. Avoid saturated fats. Dairy Full-fat yogurt, cheese, or milk. Beverages Sweetened drinks, such as soda or iced tea. The items listed above may not be a complete list of foods and beverages you should avoid. Contact a dietitian for more information. Questions to ask a health care provider  Do I need to meet with a diabetes educator?  Do I need to meet with a dietitian?  What number can I call if I have questions?  When are the best times to check my blood glucose? Where to find more information:  American Diabetes Association: diabetes.org  Academy of Nutrition and Dietetics: www.eatright.org  National Institute of Diabetes and Digestive and Kidney Diseases: www.niddk.nih.gov  Association of Diabetes Care and Education Specialists: www.diabeteseducator.org Summary  It is important to have healthy eating   habits because your blood sugar (glucose) levels are greatly affected by what you eat and drink.  A healthy meal plan will help you control your blood glucose and maintain a healthy lifestyle.  Your health care provider may recommend that you work with a dietitian to make a meal plan that is best for you.  Keep in mind that carbohydrates (carbs) and alcohol have immediate effects on your blood glucose levels. It is important to count carbs and to use alcohol carefully. This information is not intended to replace advice given to you by your health care provider. Make sure you discuss any questions you have with your health care provider. Document Revised: 04/11/2019 Document Reviewed: 04/11/2019 Elsevier Patient Education  2021 Elsevier Inc.  

## 2020-11-19 NOTE — Pre-Procedure Instructions (Signed)
Surgical Instructions    Your procedure is scheduled on Friday July 8th.  Report to [facility] Main Entrance "A" at 05:30 A.M., then check in with the Admitting office.  Call this number if you have problems the morning of surgery:  [phone]   If you have any questions prior to your surgery date call [phone]: Open Monday-Friday 8am-4pm    Remember:  Do not eat or drink after midnight the night before your surgery    Take these medicines the morning of surgery with A SIP OF WATER   DULoxetine (CYMBALTA)   fenofibrate   gabapentin (NEURONTIN)   isosorbide mononitrate (IMDUR)   metoprolol succinate (TOPROL-XL)  pantoprazole (PROTONIX)  nitroGLYCERIN (NITROSTAT)- If needed  triamcinolone (NASACORT)- If needed  linaclotide Rolan Lipa)- If needed  HYDROcodone-acetaminophen (NORCO)- If needed  albuterol (VENTOLIN HFA)- If needed    As of today, STOP taking any Aspirin (unless otherwise instructed by your surgeon) Aleve, Naproxen, Ibuprofen, Motrin, Advil, Goody's, BC's, all herbal medications, fish oil, and all vitamins.  Please follow your surgeon's instructions on when to stop taking apixaban (ELIQUIS) and clopidogrel (PLAVIX). If you have not received instructions from your surgeon, please contact your surgeon's office.  WHAT DO I DO ABOUT MY DIABETES MEDICATION?   Do not take oral diabetes medicines (pills) the morning of surgery.  DO NOT TAKE dapagliflozin propanediol (FARXIGA) the day before surgery or the morning of surgery.   THE DAY BEFORE SURGERY take your usual dose of insulin lispro (HUMALOG KWIKPEN) before meals.  The MORNING OF SURGERY DO NOT TAKE insulin lispro (HUMALOG KWIKPEN)  The DAY BEFORE SURGERY, TAKE 42 units of insulin NPH-regular Human (70-30) with your evening meal.    THE MORNING OF SURGERY, DO NOT TAKE insulin NPH-regular Human (70-30)    The day of surgery, do not take other diabetes injectables, including Byetta (exenatide), Bydureon  (exenatide ER), Victoza (liraglutide), or Trulicity (dulaglutide).  If your CBG is greater than 220 mg/dL, you may take  of your sliding scale (correction) dose of insulin.   HOW TO MANAGE YOUR DIABETES BEFORE AND AFTER SURGERY  Why is it important to control my blood sugar before and after surgery? Improving blood sugar levels before and after surgery helps healing and can limit problems. A way of improving blood sugar control is eating a healthy diet by:  Eating less sugar and carbohydrates  Increasing activity/exercise  Talking with your doctor about reaching your blood sugar goals High blood sugars (greater than 180 mg/dL) can raise your risk of infections and slow your recovery, so you will need to focus on controlling your diabetes during the weeks before surgery. Make sure that the doctor who takes care of your diabetes knows about your planned surgery including the date and location.  How do I manage my blood sugar before surgery? Check your blood sugar at least 4 times a day, starting 2 days before surgery, to make sure that the level is not too high or low.  Check your blood sugar the morning of your surgery when you wake up and every 2 hours until you get to the Short Stay unit.  If your blood sugar is less than 70 mg/dL, you will need to treat for low blood sugar: Do not take insulin. Treat a low blood sugar (less than 70 mg/dL) with  cup of clear juice (cranberry or apple), 4 glucose tablets, OR glucose gel. Recheck blood sugar in 15 minutes after treatment (to make sure it is greater than 70  mg/dL). If your blood sugar is not greater than 70 mg/dL on recheck, call [phone] for further instructions. Report your blood sugar to the short stay nurse when you get to Short Stay.  If you are admitted to the hospital after surgery: Your blood sugar will be checked by the staff and you will probably be given insulin after surgery (instead of oral diabetes medicines) to make  sure you have good blood sugar levels. The goal for blood sugar control after surgery is 80-180 mg/dL.                      Do NOT Smoke (Tobacco/Vaping) or drink Alcohol 24 hours prior to your procedure.  If you use a CPAP at night, you may bring all equipment for your overnight stay.   Contacts, glasses, piercing's, hearing aid's, dentures or partials may not be worn into surgery, please bring cases for these belongings.    For patients admitted to the hospital, discharge time will be determined by your treatment team.   Patients discharged the day of surgery will not be allowed to drive home, and someone needs to stay with them for 24 hours.  ONLY 1 SUPPORT PERSON MAY BE PRESENT WHILE YOU ARE IN SURGERY. IF YOU ARE TO BE ADMITTED ONCE YOU ARE IN YOUR ROOM YOU WILL BE ALLOWED TWO (2) VISITORS.  Minor children may have two parents present. Special consideration for safety and communication needs will be reviewed on a case by case basis.   Special instructions:   [place]- Preparing For Surgery  Before surgery, you can play an important role. Because skin is not sterile, your skin needs to be as free of germs as possible. You can reduce the number of germs on your skin by washing with CHG (chlorahexidine gluconate) Soap before surgery.  CHG is an antiseptic cleaner which kills germs and bonds with the skin to continue killing germs even after washing.    Oral Hygiene is also important to reduce your risk of infection.  Remember - BRUSH YOUR TEETH THE MORNING OF SURGERY WITH YOUR REGULAR TOOTHPASTE  Please do not use if you have an allergy to CHG or antibacterial soaps. If your skin becomes reddened/irritated stop using the CHG.  Do not shave (including legs and underarms) for at least 48 hours prior to first CHG shower. It is OK to shave your face.  Please follow these instructions carefully.   Shower the NIGHT BEFORE SURGERY and the MORNING OF SURGERY  If you chose to wash your  hair, wash your hair first as usual with your normal shampoo.  After you shampoo, rinse your hair and body thoroughly to remove the shampoo.  Use CHG Soap as you would any other liquid soap. You can apply CHG directly to the skin and wash gently with a scrungie or a clean washcloth.   Apply the CHG Soap to your body ONLY FROM THE NECK DOWN.  Do not use on open wounds or open sores. Avoid contact with your eyes, ears, mouth and genitals (private parts). Wash Face and genitals (private parts)  with your normal soap.   Wash thoroughly, paying special attention to the area where your surgery will be performed.  Thoroughly rinse your body with warm water from the neck down.  DO NOT shower/wash with your normal soap after using and rinsing off the CHG Soap.  Pat yourself dry with a CLEAN TOWEL.  Wear CLEAN PAJAMAS to bed the night before surgery  Place CLEAN SHEETS on your bed the night before your surgery  DO NOT SLEEP WITH PETS.   Day of Surgery: Shower with CHG soap. Do not wear jewelry, make up, nail polish, gel polish, artificial nails, or any other type of covering on natural nails including finger and toenails. If patients have artificial nails, gel coating, etc. that need to be removed by a nail salon please have this removed prior to surgery. Surgery may need to be canceled/delayed if the surgeon/ anesthesia feels like the patient is unable to be adequately monitored. Do not wear lotions, powders, perfumes/colognes, or deodorant. Do not shave 48 hours prior to surgery.  Men may shave face and neck. Do not bring valuables to the hospital. [facility] is not responsible for any belongings or valuables. Wear Clean/Comfortable clothing the morning of surgery Remember to brush your teeth WITH YOUR REGULAR TOOTHPASTE.   Please read over the following fact sheets that you were given.

## 2020-11-20 ENCOUNTER — Encounter (HOSPITAL_COMMUNITY)
Admission: RE | Admit: 2020-11-20 | Discharge: 2020-11-20 | Disposition: A | Discharge status: Home / Self Care | Payer: Medicare Other | Source: Ambulatory Visit | Attending: Surgery | Admitting: Surgery

## 2020-11-20 ENCOUNTER — Encounter (HOSPITAL_COMMUNITY): Payer: Self-pay

## 2020-11-20 ENCOUNTER — Other Ambulatory Visit (HOSPITAL_COMMUNITY): Payer: Medicare Other

## 2020-11-20 ENCOUNTER — Other Ambulatory Visit: Payer: Self-pay

## 2020-11-20 DIAGNOSIS — I2510 Atherosclerotic heart disease of native coronary artery without angina pectoris: Secondary | ICD-10-CM | POA: Insufficient documentation

## 2020-11-20 DIAGNOSIS — Z955 Presence of coronary angioplasty implant and graft: Secondary | ICD-10-CM | POA: Insufficient documentation

## 2020-11-20 DIAGNOSIS — E11319 Type 2 diabetes mellitus with unspecified diabetic retinopathy without macular edema: Secondary | ICD-10-CM | POA: Diagnosis not present

## 2020-11-20 DIAGNOSIS — I63233 Cerebral infarction due to unspecified occlusion or stenosis of bilateral carotid arteries: Secondary | ICD-10-CM | POA: Diagnosis not present

## 2020-11-20 DIAGNOSIS — G4734 Idiopathic sleep related nonobstructive alveolar hypoventilation: Secondary | ICD-10-CM | POA: Diagnosis not present

## 2020-11-20 DIAGNOSIS — I70239 Atherosclerosis of native arteries of right leg with ulceration of unspecified site: Secondary | ICD-10-CM | POA: Diagnosis not present

## 2020-11-20 DIAGNOSIS — I6523 Occlusion and stenosis of bilateral carotid arteries: Secondary | ICD-10-CM | POA: Diagnosis not present

## 2020-11-20 DIAGNOSIS — I4891 Unspecified atrial fibrillation: Secondary | ICD-10-CM | POA: Diagnosis not present

## 2020-11-20 DIAGNOSIS — I5032 Chronic diastolic (congestive) heart failure: Secondary | ICD-10-CM | POA: Diagnosis not present

## 2020-11-20 DIAGNOSIS — Z95828 Presence of other vascular implants and grafts: Secondary | ICD-10-CM | POA: Diagnosis not present

## 2020-11-20 DIAGNOSIS — Z01812 Encounter for preprocedural laboratory examination: Secondary | ICD-10-CM | POA: Insufficient documentation

## 2020-11-20 DIAGNOSIS — I252 Old myocardial infarction: Secondary | ICD-10-CM | POA: Diagnosis not present

## 2020-11-20 DIAGNOSIS — Z8673 Personal history of transient ischemic attack (TIA), and cerebral infarction without residual deficits: Secondary | ICD-10-CM | POA: Diagnosis not present

## 2020-11-20 DIAGNOSIS — E11621 Type 2 diabetes mellitus with foot ulcer: Secondary | ICD-10-CM | POA: Diagnosis not present

## 2020-11-20 DIAGNOSIS — I63012 Cerebral infarction due to thrombosis of left vertebral artery: Secondary | ICD-10-CM | POA: Diagnosis not present

## 2020-11-20 DIAGNOSIS — Z20822 Contact with and (suspected) exposure to covid-19: Secondary | ICD-10-CM | POA: Insufficient documentation

## 2020-11-20 DIAGNOSIS — Z833 Family history of diabetes mellitus: Secondary | ICD-10-CM | POA: Diagnosis not present

## 2020-11-20 DIAGNOSIS — G4733 Obstructive sleep apnea (adult) (pediatric): Secondary | ICD-10-CM | POA: Diagnosis not present

## 2020-11-20 DIAGNOSIS — E785 Hyperlipidemia, unspecified: Secondary | ICD-10-CM | POA: Diagnosis not present

## 2020-11-20 DIAGNOSIS — I63532 Cerebral infarction due to unspecified occlusion or stenosis of left posterior cerebral artery: Secondary | ICD-10-CM | POA: Diagnosis not present

## 2020-11-20 DIAGNOSIS — I70232 Atherosclerosis of native arteries of right leg with ulceration of calf: Secondary | ICD-10-CM | POA: Diagnosis not present

## 2020-11-20 DIAGNOSIS — L97919 Non-pressure chronic ulcer of unspecified part of right lower leg with unspecified severity: Secondary | ICD-10-CM | POA: Diagnosis not present

## 2020-11-20 DIAGNOSIS — Z7901 Long term (current) use of anticoagulants: Secondary | ICD-10-CM | POA: Insufficient documentation

## 2020-11-20 DIAGNOSIS — I679 Cerebrovascular disease, unspecified: Secondary | ICD-10-CM | POA: Diagnosis not present

## 2020-11-20 DIAGNOSIS — I110 Hypertensive heart disease with heart failure: Secondary | ICD-10-CM | POA: Insufficient documentation

## 2020-11-20 DIAGNOSIS — L97929 Non-pressure chronic ulcer of unspecified part of left lower leg with unspecified severity: Secondary | ICD-10-CM | POA: Diagnosis not present

## 2020-11-20 DIAGNOSIS — I6389 Other cerebral infarction: Secondary | ICD-10-CM | POA: Diagnosis not present

## 2020-11-20 DIAGNOSIS — I5030 Unspecified diastolic (congestive) heart failure: Secondary | ICD-10-CM | POA: Insufficient documentation

## 2020-11-20 DIAGNOSIS — I779 Disorder of arteries and arterioles, unspecified: Secondary | ICD-10-CM | POA: Diagnosis not present

## 2020-11-20 DIAGNOSIS — K219 Gastro-esophageal reflux disease without esophagitis: Secondary | ICD-10-CM | POA: Diagnosis not present

## 2020-11-20 DIAGNOSIS — I771 Stricture of artery: Secondary | ICD-10-CM | POA: Diagnosis present

## 2020-11-20 DIAGNOSIS — I70242 Atherosclerosis of native arteries of left leg with ulceration of calf: Secondary | ICD-10-CM | POA: Diagnosis not present

## 2020-11-20 DIAGNOSIS — Z794 Long term (current) use of insulin: Secondary | ICD-10-CM | POA: Diagnosis not present

## 2020-11-20 DIAGNOSIS — I70238 Atherosclerosis of native arteries of right leg with ulceration of other part of lower right leg: Secondary | ICD-10-CM | POA: Diagnosis not present

## 2020-11-20 DIAGNOSIS — Z8249 Family history of ischemic heart disease and other diseases of the circulatory system: Secondary | ICD-10-CM | POA: Diagnosis not present

## 2020-11-20 DIAGNOSIS — E7800 Pure hypercholesterolemia, unspecified: Secondary | ICD-10-CM | POA: Diagnosis not present

## 2020-11-20 DIAGNOSIS — I639 Cerebral infarction, unspecified: Secondary | ICD-10-CM | POA: Diagnosis not present

## 2020-11-20 DIAGNOSIS — E1151 Type 2 diabetes mellitus with diabetic peripheral angiopathy without gangrene: Secondary | ICD-10-CM | POA: Diagnosis not present

## 2020-11-20 DIAGNOSIS — I70249 Atherosclerosis of native arteries of left leg with ulceration of unspecified site: Secondary | ICD-10-CM | POA: Diagnosis not present

## 2020-11-20 DIAGNOSIS — Z6837 Body mass index (BMI) 37.0-37.9, adult: Secondary | ICD-10-CM | POA: Diagnosis not present

## 2020-11-20 DIAGNOSIS — L97911 Non-pressure chronic ulcer of unspecified part of right lower leg limited to breakdown of skin: Secondary | ICD-10-CM | POA: Diagnosis not present

## 2020-11-20 DIAGNOSIS — L97921 Non-pressure chronic ulcer of unspecified part of left lower leg limited to breakdown of skin: Secondary | ICD-10-CM | POA: Diagnosis not present

## 2020-11-20 DIAGNOSIS — I739 Peripheral vascular disease, unspecified: Secondary | ICD-10-CM | POA: Insufficient documentation

## 2020-11-20 DIAGNOSIS — I480 Paroxysmal atrial fibrillation: Secondary | ICD-10-CM | POA: Insufficient documentation

## 2020-11-20 DIAGNOSIS — R270 Ataxia, unspecified: Secondary | ICD-10-CM | POA: Diagnosis not present

## 2020-11-20 DIAGNOSIS — Z95820 Peripheral vascular angioplasty status with implants and grafts: Secondary | ICD-10-CM | POA: Insufficient documentation

## 2020-11-20 DIAGNOSIS — E1140 Type 2 diabetes mellitus with diabetic neuropathy, unspecified: Secondary | ICD-10-CM | POA: Diagnosis not present

## 2020-11-20 HISTORY — DX: Unspecified diastolic (congestive) heart failure: I50.30

## 2020-11-20 HISTORY — DX: Cardiac arrhythmia, unspecified: I49.9

## 2020-11-20 HISTORY — DX: Unspecified atrial fibrillation: I48.91

## 2020-11-20 LAB — URINALYSIS, ROUTINE W REFLEX MICROSCOPIC
Bacteria, UA: NONE SEEN
Bilirubin Urine: NEGATIVE
Glucose, UA: >=500 mg/dL — AB
Ketones, ur: NEGATIVE mg/dL
Leukocytes,Ua: NEGATIVE
Nitrite: NEGATIVE
Protein, ur: 30 mg/dL — AB
Specific Gravity, Urine: 1.026 (ref 1.005–1.030)
pH: 5 (ref 5.0–8.0)

## 2020-11-20 LAB — CBC
HCT: 48.7 % (ref 39.0–52.0)
Hemoglobin: 15.1 g/dL (ref 13.0–17.0)
MCH: 27.2 pg (ref 26.0–34.0)
MCHC: 31 g/dL (ref 30.0–36.0)
MCV: 87.6 fL (ref 80.0–100.0)
Platelets: 414 10*3/uL — ABNORMAL HIGH (ref 150–400)
RBC: 5.56 MIL/uL (ref 4.22–5.81)
RDW: 14 % (ref 11.5–15.5)
WBC: 9.7 10*3/uL (ref 4.0–10.5)
nRBC: 0 % (ref 0.0–0.2)

## 2020-11-20 LAB — PROTIME-INR
INR: 1.1 (ref 0.8–1.2)
Prothrombin Time: 14 seconds (ref 11.4–15.2)

## 2020-11-20 LAB — TYPE AND SCREEN
ABO/RH(D): A POS
Antibody Screen: NEGATIVE

## 2020-11-20 LAB — APTT: aPTT: 50 seconds — ABNORMAL HIGH (ref 24–36)

## 2020-11-20 LAB — COMPREHENSIVE METABOLIC PANEL
ALT: 21 U/L (ref 0–44)
AST: 23 U/L (ref 15–41)
Albumin: 4.2 g/dL (ref 3.5–5.0)
Alkaline Phosphatase: 47 U/L (ref 38–126)
Anion gap: 11 (ref 5–15)
BUN: 25 mg/dL — ABNORMAL HIGH (ref 8–23)
CO2: 27 mmol/L (ref 22–32)
Calcium: 10.1 mg/dL (ref 8.9–10.3)
Chloride: 97 mmol/L — ABNORMAL LOW (ref 98–111)
Creatinine, Ser: 1.47 mg/dL — ABNORMAL HIGH (ref 0.61–1.24)
GFR, Estimated: 51 mL/min — ABNORMAL LOW (ref >60)
Glucose, Bld: 152 mg/dL — ABNORMAL HIGH (ref 70–99)
Potassium: 4 mmol/L (ref 3.5–5.1)
Sodium: 135 mmol/L (ref 135–145)
Total Bilirubin: 0.9 mg/dL (ref 0.3–1.2)
Total Protein: 8.2 g/dL — ABNORMAL HIGH (ref 6.5–8.1)

## 2020-11-20 LAB — SARS CORONAVIRUS 2 (TAT 6-24 HRS): SARS Coronavirus 2: NEGATIVE

## 2020-11-20 LAB — GLUCOSE, CAPILLARY: Glucose-Capillary: 146 mg/dL — ABNORMAL HIGH (ref 70–99)

## 2020-11-20 LAB — SURGICAL PCR SCREEN
MRSA, PCR: NEGATIVE
Staphylococcus aureus: NEGATIVE

## 2020-11-20 NOTE — Progress Notes (Signed)
PCP - [name], NP Cardiologist - Dr. Jenkins Rouge EP - Dr. Cristopher Peru  PPM/ICD - n/a Device Orders -  Rep Notified -   Chest x-Toryn - 06/06/20 EKG - 07/18/20 Stress Test - 2013 ECHO - 10/2019 Cardiac Cath - 05/24/20  Sleep Study - patient denies; positive stop bang - note routed to PCP CPAP -   Fasting Blood Sugar - 130s Checks Blood Sugar 2 times a day A1c 7.6 on 11/19/20  Blood Thinner Instructions: Last dose of Eliquis - 11/08/20; last dose of Plavix 11/15/20 Aspirin Instructions: n/a  ERAS Protcol - NPO after midnight PRE-SURGERY Ensure or G2- n/a  COVID TEST- at PAT appointment 11/20/20   Anesthesia review:  yes, hx of CAD  Patient denies shortness of breath, fever, cough and chest pain at PAT appointment   All instructions explained to the patient, with a verbal understanding of the material. Patient agrees to go over the instructions while at home for a better understanding. Patient also instructed to self quarantine after being tested for COVID-19. The opportunity to ask questions was provided.

## 2020-11-20 NOTE — Progress Notes (Signed)
   11/20/20 0910  OBSTRUCTIVE SLEEP APNEA  Have you ever been diagnosed with sleep apnea through a sleep study? No  Do you snore loudly (loud enough to be heard through closed doors)?  0  Do you often feel tired, fatigued, or sleepy during the daytime (such as falling asleep during driving or talking to someone)? 0  Has anyone observed you stop breathing during your sleep? 1 (patient states "my wife said I did")  Do you have, or are you being treated for high blood pressure? 1  BMI more than 35 kg/m2? 1  Age > 50 (1-yes) 1  Neck circumference greater than:Male 16 inches or larger, Male 17inches or larger? 1  Male Gender (Yes=1) 1  Obstructive Sleep Apnea Score 6  Score 5 or greater  Results sent to PCP

## 2020-11-20 NOTE — Progress Notes (Signed)
Message sent to Clinton Sawyer, RN, made aware of abnormal labs from PAT appointment.

## 2020-11-20 NOTE — Pre-Procedure Instructions (Signed)
Surgical Instructions    Your procedure is scheduled on Friday July 8th.  Report to [facility] Main Entrance "A" at 05:30 A.M., then check in with the Admitting office.  Call this number if you have problems the morning of surgery:  [phone]   If you have any questions prior to your surgery date call [phone]: Open Monday-Friday 8am-4pm    Remember:  Do not eat or drink after midnight the night before your surgery    Take these medicines the morning of surgery with A SIP OF WATER   DULoxetine (CYMBALTA)   fenofibrate   gabapentin (NEURONTIN)   isosorbide mononitrate (IMDUR)   metoprolol succinate (TOPROL-XL)  pantoprazole (PROTONIX)   nitroGLYCERIN (NITROSTAT)- If needed  triamcinolone (NASACORT)- If needed  linaclotide Rolan Lipa)- If needed  HYDROcodone-acetaminophen (NORCO)- If needed  albuterol (VENTOLIN HFA)- If needed    As of today, STOP taking any Aspirin (unless otherwise instructed by your surgeon) Aleve, Naproxen, Ibuprofen, Motrin, Advil, Goody's, BC's, all herbal medications, fish oil, and all vitamins.  Please follow your surgeon's instructions on when to stop taking apixaban (ELIQUIS) and clopidogrel (PLAVIX). If you have not received instructions from your surgeon, please contact your surgeon's office.  WHAT DO I DO ABOUT MY DIABETES MEDICATION?  Do not take oral diabetes medicines (pills) the morning of surgery.   Dapagliflozin propanediol (FARXIGA) THE DAY BEFORE SURGERY - HOLD medication THE MORNING OF SURGERY - HOLD medication  Insulin lispro (HUMALOG KWIKPEN) THE DAY BEFORE SURGERY - take usual doses THE MORNING OF SURGERY - HOLD medication  Insulin NPH-regular Human (70-30)  THE DAY BEFORE SURGERY - take usual morning dose. THE DAY BEFORE SURGERY - take 42 units with your evening meal.  THE MORNING OF SURGERY, DO NOT TAKE insulin NPH-regular Human (70-30)   The day of surgery, do not take other diabetes injectables, including Byetta  (exenatide), Bydureon (exenatide ER), Victoza (liraglutide), or Trulicity (dulaglutide).  If your CBG is greater than 220 mg/dL, you may take  of your sliding scale (correction) dose of insulin.   HOW TO MANAGE YOUR DIABETES BEFORE AND AFTER SURGERY  Why is it important to control my blood sugar before and after surgery? Improving blood sugar levels before and after surgery helps healing and can limit problems. A way of improving blood sugar control is eating a healthy diet by:  Eating less sugar and carbohydrates  Increasing activity/exercise  Talking with your doctor about reaching your blood sugar goals High blood sugars (greater than 180 mg/dL) can raise your risk of infections and slow your recovery, so you will need to focus on controlling your diabetes during the weeks before surgery. Make sure that the doctor who takes care of your diabetes knows about your planned surgery including the date and location.  How do I manage my blood sugar before surgery? Check your blood sugar at least 4 times a day, starting 2 days before surgery, to make sure that the level is not too high or low.  Check your blood sugar the morning of your surgery when you wake up and every 2 hours until you get to the Short Stay unit.  If your blood sugar is less than 70 mg/dL, you will need to treat for low blood sugar: Do not take insulin. Treat a low blood sugar (less than 70 mg/dL) with  cup of clear juice (cranberry or apple), 4 glucose tablets, OR glucose gel. Recheck blood sugar in 15 minutes after treatment (to make sure it is greater than  70 mg/dL). If your blood sugar is not greater than 70 mg/dL on recheck, call [phone] for further instructions. Report your blood sugar to the short stay nurse when you get to Short Stay.  If you are admitted to the hospital after surgery: Your blood sugar will be checked by the staff and you will probably be given insulin after surgery (instead of oral diabetes  medicines) to make sure you have good blood sugar levels. The goal for blood sugar control after surgery is 80-180 mg/dL.                      Do NOT Smoke (Tobacco/Vaping) or drink Alcohol 24 hours prior to your procedure.  If you use a CPAP at night, you may bring all equipment for your overnight stay.   Contacts, glasses, piercing's, hearing aid's, dentures or partials may not be worn into surgery, please bring cases for these belongings.    For patients admitted to the hospital, discharge time will be determined by your treatment team.   Patients discharged the day of surgery will not be allowed to drive home, and someone needs to stay with them for 24 hours.  ONLY 1 SUPPORT PERSON MAY BE PRESENT WHILE YOU ARE IN SURGERY. IF YOU ARE TO BE ADMITTED ONCE YOU ARE IN YOUR ROOM YOU WILL BE ALLOWED TWO (2) VISITORS.  Minor children may have two parents present. Special consideration for safety and communication needs will be reviewed on a case by case basis.   Special instructions:   [place]- Preparing For Surgery  Before surgery, you can play an important role. Because skin is not sterile, your skin needs to be as free of germs as possible. You can reduce the number of germs on your skin by washing with CHG (chlorahexidine gluconate) Soap before surgery.  CHG is an antiseptic cleaner which kills germs and bonds with the skin to continue killing germs even after washing.    Oral Hygiene is also important to reduce your risk of infection.  Remember - BRUSH YOUR TEETH THE MORNING OF SURGERY WITH YOUR REGULAR TOOTHPASTE  Please do not use if you have an allergy to CHG or antibacterial soaps. If your skin becomes reddened/irritated stop using the CHG.  Do not shave (including legs and underarms) for at least 48 hours prior to first CHG shower. It is OK to shave your face.  Please follow these instructions carefully.   Shower the NIGHT BEFORE SURGERY and the MORNING OF SURGERY  If you  chose to wash your hair, wash your hair first as usual with your normal shampoo.  After you shampoo, rinse your hair and body thoroughly to remove the shampoo.  Use CHG Soap as you would any other liquid soap. You can apply CHG directly to the skin and wash gently with a scrungie or a clean washcloth.   Apply the CHG Soap to your body ONLY FROM THE NECK DOWN.  Do not use on open wounds or open sores. Avoid contact with your eyes, ears, mouth and genitals (private parts). Wash Face and genitals (private parts)  with your normal soap.   Wash thoroughly, paying special attention to the area where your surgery will be performed.  Thoroughly rinse your body with warm water from the neck down.  DO NOT shower/wash with your normal soap after using and rinsing off the CHG Soap.  Pat yourself dry with a CLEAN TOWEL.  Wear CLEAN PAJAMAS to bed the night before  surgery  Place CLEAN SHEETS on your bed the night before your surgery  DO NOT SLEEP WITH PETS.   Day of Surgery: Shower with CHG soap. Do not wear jewelry, make up, nail polish, gel polish, artificial nails, or any other type of covering on natural nails including finger and toenails. If patients have artificial nails, gel coating, etc. that need to be removed by a nail salon please have this removed prior to surgery. Surgery may need to be canceled/delayed if the surgeon/ anesthesia feels like the patient is unable to be adequately monitored. Do not wear lotions, powders, perfumes/colognes, or deodorant. Do not shave 48 hours prior to surgery.  Men may shave face and neck. Do not bring valuables to the hospital. [facility] is not responsible for any belongings or valuables. Wear Clean/Comfortable clothing the morning of surgery Remember to brush your teeth WITH YOUR REGULAR TOOTHPASTE.   Please read over the following fact sheets that you were given.

## 2020-11-21 ENCOUNTER — Telehealth: Payer: Self-pay

## 2020-11-21 ENCOUNTER — Encounter (HOSPITAL_COMMUNITY): Payer: Self-pay | Admitting: Surgery

## 2020-11-21 NOTE — Telephone Encounter (Signed)
-----   Message from [name], MD sent at 11/20/2020  7:22 PM EDT ----- Regarding: RE: Abnormal labs They are ok to proceed ----- Message ----- From: Nicholas Lose, RN Sent: 11/20/2020   4:03 PM EDT To: [name], MD Subject: Abnormal labs                                  WB  I received a message from preadmission testing- please review patient's abnormal labs. He's scheduled for a fem-pop/angio on 11/22/20.   Herma Ard

## 2020-11-21 NOTE — Anesthesia Preprocedure Evaluation (Addendum)
Anesthesia Evaluation  Patient identified by MRN, date of birth, ID band Patient awake    Reviewed: Allergy & Precautions, NPO status , Patient's Chart, lab work & pertinent test results, reviewed documented beta blocker date and time   History of Anesthesia Complications Negative for: history of anesthetic complications  Airway Mallampati: II  TM Distance: >3 FB Neck ROM: Full    Dental  (+) Upper Dentures, Lower Dentures   Pulmonary asthma , sleep apnea (Possible based on STOP BANG score) , former smoker,    Pulmonary exam normal        Cardiovascular hypertension, Pt. on medications and Pt. on home beta blockers + CAD, + Past MI, + Cardiac Stents, + Peripheral Vascular Disease and +CHF  Normal cardiovascular exam+ dysrhythmias Atrial Fibrillation    '21 Carotid US - 40-59% right ICAS, 1-39% left ICAS  '21 TTE - Inferior basal akinesis. EF 50-55%. There is mild left ventricular hypertrophy. Trivial MR. Mild to moderate aortic valve sclerosis/calcification is present, without any evidence of aortic stenosis.    Neuro/Psych  Headaches, PSYCHIATRIC DISORDERS Anxiety Depression CVA, No Residual Symptoms    GI/Hepatic Neg liver ROS, GERD  Medicated and Controlled,  Endo/Other  diabetes, Insulin Dependent Obesity   Renal/GU CRFRenal disease     Musculoskeletal  (+) Arthritis ,   Abdominal   Peds  Hematology  On plavix and eliquis    Anesthesia Other Findings Covid test negative See PAT note  Reproductive/Obstetrics                           Anesthesia Physical Anesthesia Plan  ASA: 3  Anesthesia Plan: General   Post-op Pain Management:    Induction: Intravenous  PONV Risk Score and Plan: 2 and Treatment may vary due to age or medical condition, Ondansetron and Dexamethasone  Airway Management Planned: Oral ETT  Additional Equipment: Arterial line  Intra-op Plan:    Post-operative Plan: Extubation in OR  Informed Consent: I have reviewed the patients History and Physical, chart, labs and discussed the procedure including the risks, benefits and alternatives for the proposed anesthesia with the patient or authorized representative who has indicated his/her understanding and acceptance.     Dental advisory given  Plan Discussed with: CRNA and Anesthesiologist  Anesthesia Plan Comments:       Anesthesia Quick Evaluation

## 2020-11-21 NOTE — Progress Notes (Signed)
Anesthesia Chart Review:  Follows with cardiology for hx of HTN, PAF on Eliquis, HFpEF, cryptogenic stroke s/p ILR 05/2016 (subsequently diagnosed PAF), CAD - DES to RCA 2010 with f/u cath 2018 showing CTO RCA with collaterals.  Cath 05/24/20 with new stents placed to to circumflex and OM1 with occluded D2 and continued left to right collaterals to distal RCA and left to left collaterals to distal circumflex ; EF 50-55% by TTE June 2021. Preop clearance 11/05/20, "Chart reviewed as part of pre-operative protocol coverage. Patient was recently seen by Dr. Lovena Le on 10/16/2020 at which time he was doing well with no palpitations or any anginal complaints. Given past medical history and time since last visit, based on ACC/AHA guidelines, Nechemia A Bowens would be at acceptable risk for the planned procedure without further cardiovascular testing. Per Dr. [name], [place] to hold Eliquis for 3 days prior to vascular procedure. Please restart this as soon as able following procedure."  Follows with vascular surgery for hx of PVD. On 04/17/2019 he underwent stenting of multiple right SFA stenoses. Left superficial femoral artery could not be recanalized.  Per surgery posting, patient stopped Plavix 11/12/2020 and is to hold Eliquis 3 days prior to procedure.  Stop bang score of 6, concerning for possible OSA.  IDDMII, last A1c 7.6 11/19/20.  Proep labs reviewed, APTT elevated at 50, Dr [name] reviewed, okay to proceed.   EKG 07/18/20: Atrial fibrillation. Rate 91.  -Old anterior infarct.   -  Nonspecific T-abnormality.   Cath and PCI 11/22/20: 1. Moderate mid LAD stenosis unchanged. Chronic occlusion Diagonal 1. Severe disease small diagonal 2. 2. Severe restenosis Circumflex/OM stents. 3. Successful PTCA/DES x 2 with overlapping Resolute Onyx DES in the Circumflex/OM 4. Chronic occlusion mid RCA in old stented segment. The distal vessel fills briskly from left to right collaterals.   Recommendations: Continue Plavix.  Resume anticoagulation tomorrow. Will continue ASA 81 mg daily until he is fully anti-coagulated then OK to stop ASA.  TTE 11/01/19:  1. Inferior basal akinesis . Left ventricular ejection fraction, by  estimation, is 50 to 55%. The left ventricle has low normal function. The  left ventricle demonstrates regional wall motion abnormalities (see  scoring diagram/findings for description).  There is mild left ventricular hypertrophy. Left ventricular diastolic  parameters were normal.   2. Right ventricular systolic function is normal. The right ventricular  size is normal.   3. The mitral valve is normal in structure. Trivial mitral valve  regurgitation. No evidence of mitral stenosis.   4. Calcification of the non coronary cusp . The aortic valve is  tricuspid. Aortic valve regurgitation is not visualized. Mild to moderate  aortic valve sclerosis/calcification is present, without any evidence of  aortic stenosis.   5. The inferior vena cava is dilated in size with >50% respiratory  variability, suggesting right atrial pressure of 8 mmHg.    Wynonia Musty [facility] Short Stay Center/Anesthesiology Phone [phone] 11/21/2020 10:29 AM

## 2020-11-22 ENCOUNTER — Other Ambulatory Visit: Payer: Self-pay

## 2020-11-22 ENCOUNTER — Inpatient Hospital Stay (HOSPITAL_COMMUNITY): Payer: Medicare Other

## 2020-11-22 ENCOUNTER — Inpatient Hospital Stay (HOSPITAL_COMMUNITY): Payer: Medicare Other | Admitting: Certified Registered Nurse Anesthetist

## 2020-11-22 ENCOUNTER — Inpatient Hospital Stay (HOSPITAL_COMMUNITY)
Admission: RE | Admit: 2020-11-22 | Discharge: 2020-11-25 | DRG: 253 | Disposition: A | Discharge status: Home Health | Payer: Medicare Other | Attending: Surgery | Admitting: Surgery

## 2020-11-22 ENCOUNTER — Encounter (HOSPITAL_COMMUNITY): Payer: Self-pay | Admitting: Surgery

## 2020-11-22 ENCOUNTER — Encounter (HOSPITAL_COMMUNITY)
Admission: RE | Disposition: A | Discharge status: Home Health | Payer: Self-pay | Source: Home / Self Care | Attending: Surgery

## 2020-11-22 ENCOUNTER — Inpatient Hospital Stay (HOSPITAL_COMMUNITY): Payer: Medicare Other | Admitting: Physician Assistant

## 2020-11-22 DIAGNOSIS — Z6837 Body mass index (BMI) 37.0-37.9, adult: Secondary | ICD-10-CM

## 2020-11-22 DIAGNOSIS — Z7901 Long term (current) use of anticoagulants: Secondary | ICD-10-CM

## 2020-11-22 DIAGNOSIS — G4733 Obstructive sleep apnea (adult) (pediatric): Secondary | ICD-10-CM | POA: Diagnosis present

## 2020-11-22 DIAGNOSIS — I70249 Atherosclerosis of native arteries of left leg with ulceration of unspecified site: Secondary | ICD-10-CM | POA: Diagnosis present

## 2020-11-22 DIAGNOSIS — K219 Gastro-esophageal reflux disease without esophagitis: Secondary | ICD-10-CM | POA: Diagnosis present

## 2020-11-22 DIAGNOSIS — I6389 Other cerebral infarction: Secondary | ICD-10-CM | POA: Diagnosis not present

## 2020-11-22 DIAGNOSIS — E7800 Pure hypercholesterolemia, unspecified: Secondary | ICD-10-CM | POA: Diagnosis present

## 2020-11-22 DIAGNOSIS — Z818 Family history of other mental and behavioral disorders: Secondary | ICD-10-CM

## 2020-11-22 DIAGNOSIS — Z7902 Long term (current) use of antithrombotics/antiplatelets: Secondary | ICD-10-CM

## 2020-11-22 DIAGNOSIS — I70232 Atherosclerosis of native arteries of right leg with ulceration of calf: Secondary | ICD-10-CM

## 2020-11-22 DIAGNOSIS — Z801 Family history of malignant neoplasm of trachea, bronchus and lung: Secondary | ICD-10-CM

## 2020-11-22 DIAGNOSIS — I252 Old myocardial infarction: Secondary | ICD-10-CM | POA: Diagnosis not present

## 2020-11-22 DIAGNOSIS — Z823 Family history of stroke: Secondary | ICD-10-CM

## 2020-11-22 DIAGNOSIS — Z8249 Family history of ischemic heart disease and other diseases of the circulatory system: Secondary | ICD-10-CM | POA: Diagnosis not present

## 2020-11-22 DIAGNOSIS — Z20822 Contact with and (suspected) exposure to covid-19: Secondary | ICD-10-CM | POA: Diagnosis present

## 2020-11-22 DIAGNOSIS — E785 Hyperlipidemia, unspecified: Secondary | ICD-10-CM | POA: Diagnosis present

## 2020-11-22 DIAGNOSIS — Z833 Family history of diabetes mellitus: Secondary | ICD-10-CM | POA: Diagnosis not present

## 2020-11-22 DIAGNOSIS — Z800 Family history of malignant neoplasm of digestive organs: Secondary | ICD-10-CM

## 2020-11-22 DIAGNOSIS — Z955 Presence of coronary angioplasty implant and graft: Secondary | ICD-10-CM

## 2020-11-22 DIAGNOSIS — L97911 Non-pressure chronic ulcer of unspecified part of right lower leg limited to breakdown of skin: Secondary | ICD-10-CM | POA: Diagnosis present

## 2020-11-22 DIAGNOSIS — E1151 Type 2 diabetes mellitus with diabetic peripheral angiopathy without gangrene: Secondary | ICD-10-CM | POA: Diagnosis present

## 2020-11-22 DIAGNOSIS — I70239 Atherosclerosis of native arteries of right leg with ulceration of unspecified site: Secondary | ICD-10-CM | POA: Diagnosis present

## 2020-11-22 DIAGNOSIS — I110 Hypertensive heart disease with heart failure: Secondary | ICD-10-CM | POA: Diagnosis present

## 2020-11-22 DIAGNOSIS — L97921 Non-pressure chronic ulcer of unspecified part of left lower leg limited to breakdown of skin: Secondary | ICD-10-CM | POA: Diagnosis present

## 2020-11-22 DIAGNOSIS — E11319 Type 2 diabetes mellitus with unspecified diabetic retinopathy without macular edema: Secondary | ICD-10-CM | POA: Diagnosis present

## 2020-11-22 DIAGNOSIS — I6523 Occlusion and stenosis of bilateral carotid arteries: Secondary | ICD-10-CM | POA: Diagnosis not present

## 2020-11-22 DIAGNOSIS — I2510 Atherosclerotic heart disease of native coronary artery without angina pectoris: Secondary | ICD-10-CM | POA: Diagnosis present

## 2020-11-22 DIAGNOSIS — Z95828 Presence of other vascular implants and grafts: Secondary | ICD-10-CM

## 2020-11-22 DIAGNOSIS — I4891 Unspecified atrial fibrillation: Secondary | ICD-10-CM | POA: Diagnosis present

## 2020-11-22 DIAGNOSIS — I679 Cerebrovascular disease, unspecified: Secondary | ICD-10-CM | POA: Diagnosis not present

## 2020-11-22 DIAGNOSIS — I5032 Chronic diastolic (congestive) heart failure: Secondary | ICD-10-CM | POA: Diagnosis present

## 2020-11-22 DIAGNOSIS — Z820 Family history of epilepsy and other diseases of the nervous system: Secondary | ICD-10-CM

## 2020-11-22 DIAGNOSIS — E1140 Type 2 diabetes mellitus with diabetic neuropathy, unspecified: Secondary | ICD-10-CM | POA: Diagnosis present

## 2020-11-22 DIAGNOSIS — Z8261 Family history of arthritis: Secondary | ICD-10-CM

## 2020-11-22 DIAGNOSIS — I70242 Atherosclerosis of native arteries of left leg with ulceration of calf: Secondary | ICD-10-CM | POA: Diagnosis not present

## 2020-11-22 DIAGNOSIS — Z83438 Family history of other disorder of lipoprotein metabolism and other lipidemia: Secondary | ICD-10-CM

## 2020-11-22 DIAGNOSIS — Z9119 Patient's noncompliance with other medical treatment and regimen: Secondary | ICD-10-CM

## 2020-11-22 DIAGNOSIS — I771 Stricture of artery: Secondary | ICD-10-CM | POA: Diagnosis present

## 2020-11-22 DIAGNOSIS — Z794 Long term (current) use of insulin: Secondary | ICD-10-CM

## 2020-11-22 DIAGNOSIS — R270 Ataxia, unspecified: Secondary | ICD-10-CM | POA: Diagnosis present

## 2020-11-22 DIAGNOSIS — Z8673 Personal history of transient ischemic attack (TIA), and cerebral infarction without residual deficits: Secondary | ICD-10-CM

## 2020-11-22 DIAGNOSIS — I779 Disorder of arteries and arterioles, unspecified: Secondary | ICD-10-CM | POA: Diagnosis present

## 2020-11-22 DIAGNOSIS — Z79899 Other long term (current) drug therapy: Secondary | ICD-10-CM

## 2020-11-22 DIAGNOSIS — Z87891 Personal history of nicotine dependence: Secondary | ICD-10-CM

## 2020-11-22 HISTORY — PX: FEMORAL-POPLITEAL BYPASS GRAFT: SHX937

## 2020-11-22 HISTORY — PX: INSERTION OF ILIAC STENT: SHX6256

## 2020-11-22 HISTORY — PX: LOWER EXTREMITY ANGIOGRAM: SHX5508

## 2020-11-22 LAB — POCT ACTIVATED CLOTTING TIME
Activated Clotting Time: 219 seconds
Activated Clotting Time: 231 seconds
Activated Clotting Time: 196 seconds
Activated Clotting Time: 208 seconds
Activated Clotting Time: 202 seconds

## 2020-11-22 LAB — GLUCOSE, CAPILLARY
Glucose-Capillary: 112 mg/dL — ABNORMAL HIGH (ref 70–99)
Glucose-Capillary: 73 mg/dL (ref 70–99)
Glucose-Capillary: 119 mg/dL — ABNORMAL HIGH (ref 70–99)
Glucose-Capillary: 81 mg/dL (ref 70–99)
Glucose-Capillary: 146 mg/dL — ABNORMAL HIGH (ref 70–99)
Glucose-Capillary: 169 mg/dL — ABNORMAL HIGH (ref 70–99)

## 2020-11-22 LAB — ABO/RH: ABO/RH(D): A POS

## 2020-11-22 SURGERY — BYPASS GRAFT FEMORAL-POPLITEAL ARTERY
Anesthesia: General | Site: Leg Upper | Laterality: Right

## 2020-11-22 MED ORDER — PANTOPRAZOLE SODIUM 40 MG PO TBEC
40.0000 mg | DELAYED_RELEASE_TABLET | Freq: Every day | ORAL | Status: DC
  Administered 2020-11-23 – 2020-11-25 (×3): 40 mg via ORAL
  Filled 2020-11-22 (×3): qty 1

## 2020-11-22 MED ORDER — OXYCODONE HCL 5 MG/5ML PO SOLN
5.0000 mg | Freq: Once | ORAL | Status: DC | PRN
Start: 2020-11-22 — End: 2020-11-22

## 2020-11-22 MED ORDER — ORAL CARE MOUTH RINSE: 15.0000 mL | Freq: Once | OROMUCOSAL | Status: AC

## 2020-11-22 MED ORDER — HEPARIN SODIUM (PORCINE) 1000 UNIT/ML IJ SOLN
INTRAMUSCULAR | Status: AC
  Filled 2020-11-22: qty 1

## 2020-11-22 MED ORDER — PHENOL 1.4 % MT LIQD: 1.0000 | OROMUCOSAL | Status: DC | PRN

## 2020-11-22 MED ORDER — MORPHINE SULFATE (PF) 2 MG/ML IV SOLN
2.0000 mg | INTRAVENOUS | Status: DC | PRN
Start: 2020-11-22 — End: 2020-11-25
  Administered 2020-11-23 – 2020-11-24 (×5): 2 mg via INTRAVENOUS
  Filled 2020-11-22 (×5): qty 1

## 2020-11-22 MED ORDER — HYDRALAZINE HCL 20 MG/ML IJ SOLN: 5.0000 mg | INTRAMUSCULAR | Status: DC | PRN

## 2020-11-22 MED ORDER — MAGNESIUM SULFATE 2 GM/50ML IV SOLN: 2.0000 g | Freq: Every day | INTRAVENOUS | Status: DC | PRN

## 2020-11-22 MED ORDER — GUAIFENESIN-DM 100-10 MG/5ML PO SYRP: 15.0000 mL | ORAL_SOLUTION | ORAL | Status: DC | PRN

## 2020-11-22 MED ORDER — EPHEDRINE SULFATE 50 MG/ML IJ SOLN
INTRAMUSCULAR | Status: DC | PRN
  Administered 2020-11-22: 15 mg via INTRAVENOUS

## 2020-11-22 MED ORDER — SODIUM CHLORIDE 0.9 % IV SOLN: INTRAVENOUS | Status: DC

## 2020-11-22 MED ORDER — FENTANYL CITRATE (PF) 100 MCG/2ML IJ SOLN
INTRAMUSCULAR | Status: DC | PRN
  Administered 2020-11-22: 100 ug via INTRAVENOUS
  Administered 2020-11-22: 50 ug via INTRAVENOUS
  Administered 2020-11-22: 100 ug via INTRAVENOUS
  Administered 2020-11-22 (×5): 50 ug via INTRAVENOUS

## 2020-11-22 MED ORDER — GABAPENTIN 300 MG PO CAPS
900.0000 mg | ORAL_CAPSULE | Freq: Three times a day (TID) | ORAL | Status: DC
  Administered 2020-11-23 – 2020-11-25 (×7): 900 mg via ORAL
  Filled 2020-11-22 (×7): qty 3

## 2020-11-22 MED ORDER — PROTAMINE SULFATE 10 MG/ML IV SOLN
INTRAVENOUS | Status: AC
  Filled 2020-11-22: qty 5

## 2020-11-22 MED ORDER — HYDROMORPHONE HCL 1 MG/ML IJ SOLN
INTRAMUSCULAR | Status: AC
  Filled 2020-11-22: qty 0.5

## 2020-11-22 MED ORDER — ALBUTEROL SULFATE (2.5 MG/3ML) 0.083% IN NEBU: 2.5000 mg | INHALATION_SOLUTION | Freq: Four times a day (QID) | RESPIRATORY_TRACT | Status: DC | PRN

## 2020-11-22 MED ORDER — SODIUM CHLORIDE 0.9 % IV SOLN: 500.0000 mL | Freq: Once | INTRAVENOUS | Status: DC | PRN

## 2020-11-22 MED ORDER — PHENYLEPHRINE HCL-NACL 20-0.9 MG/250ML-% IV SOLN
INTRAVENOUS | Status: DC | PRN
  Administered 2020-11-22: 50 ug/min via INTRAVENOUS
  Administered 2020-11-22: 25 ug/min via INTRAVENOUS

## 2020-11-22 MED ORDER — PROPOFOL 10 MG/ML IV BOLUS
INTRAVENOUS | Status: DC | PRN
  Administered 2020-11-22: 30 mg via INTRAVENOUS
  Administered 2020-11-22: 140 mg via INTRAVENOUS

## 2020-11-22 MED ORDER — PHENYLEPHRINE 40 MCG/ML (10ML) SYRINGE FOR IV PUSH (FOR BLOOD PRESSURE SUPPORT)
PREFILLED_SYRINGE | INTRAVENOUS | Status: AC
  Filled 2020-11-22: qty 10

## 2020-11-22 MED ORDER — CHLORHEXIDINE GLUCONATE CLOTH 2 % EX PADS: 6.0000 | MEDICATED_PAD | Freq: Once | CUTANEOUS | Status: DC

## 2020-11-22 MED ORDER — OXYCODONE HCL 5 MG PO TABS: 5.0000 mg | ORAL_TABLET | Freq: Once | ORAL | Status: DC | PRN

## 2020-11-22 MED ORDER — DOCUSATE SODIUM 100 MG PO CAPS
100.0000 mg | ORAL_CAPSULE | Freq: Every day | ORAL | Status: DC
  Administered 2020-11-23 – 2020-11-25 (×3): 100 mg via ORAL
  Filled 2020-11-22 (×3): qty 1

## 2020-11-22 MED ORDER — PHENYLEPHRINE HCL (PRESSORS) 10 MG/ML IV SOLN
INTRAVENOUS | Status: DC | PRN
  Administered 2020-11-22: 120 ug via INTRAVENOUS
  Administered 2020-11-22: 240 ug via INTRAVENOUS
  Administered 2020-11-22: 40 ug via INTRAVENOUS
  Administered 2020-11-22: 80 ug via INTRAVENOUS
  Administered 2020-11-22 (×2): 120 ug via INTRAVENOUS
  Administered 2020-11-22: 40 ug via INTRAVENOUS
  Administered 2020-11-22: 100 ug via INTRAVENOUS

## 2020-11-22 MED ORDER — ONDANSETRON HCL 4 MG/2ML IJ SOLN
INTRAMUSCULAR | Status: DC | PRN
  Administered 2020-11-22: 4 mg via INTRAVENOUS

## 2020-11-22 MED ORDER — PROTAMINE SULFATE 10 MG/ML IV SOLN
INTRAVENOUS | Status: DC | PRN
  Administered 2020-11-22: 50 mg via INTRAVENOUS

## 2020-11-22 MED ORDER — SUCCINYLCHOLINE CHLORIDE 20 MG/ML IJ SOLN
INTRAMUSCULAR | Status: DC | PRN
  Administered 2020-11-22: 140 mg via INTRAVENOUS

## 2020-11-22 MED ORDER — ACETAMINOPHEN 650 MG RE SUPP: 325.0000 mg | RECTAL | Status: DC | PRN

## 2020-11-22 MED ORDER — POLYETHYLENE GLYCOL 3350 17 G PO PACK: 17.0000 g | PACK | Freq: Every day | ORAL | Status: DC | PRN

## 2020-11-22 MED ORDER — METOPROLOL TARTRATE 5 MG/5ML IV SOLN: 2.0000 mg | INTRAVENOUS | Status: DC | PRN

## 2020-11-22 MED ORDER — SODIUM CHLORIDE 0.9 % IV SOLN: INTRAVENOUS | Status: DC | PRN

## 2020-11-22 MED ORDER — CEFAZOLIN SODIUM-DEXTROSE 2-4 GM/100ML-% IV SOLN
2.0000 g | Freq: Three times a day (TID) | INTRAVENOUS | Status: AC
Start: 2020-11-23 — End: 2020-11-23
  Administered 2020-11-23 (×2): 2 g via INTRAVENOUS
  Filled 2020-11-22 (×2): qty 100

## 2020-11-22 MED ORDER — LACTATED RINGERS IV SOLN: INTRAVENOUS | Status: DC

## 2020-11-22 MED ORDER — POTASSIUM CHLORIDE CRYS ER 20 MEQ PO TBCR: 20.0000 meq | EXTENDED_RELEASE_TABLET | Freq: Every day | ORAL | Status: DC | PRN

## 2020-11-22 MED ORDER — OXYCODONE-ACETAMINOPHEN 5-325 MG PO TABS
1.0000 | ORAL_TABLET | ORAL | Status: DC | PRN
  Administered 2020-11-22 – 2020-11-25 (×7): 2 via ORAL
  Filled 2020-11-22 (×7): qty 2

## 2020-11-22 MED ORDER — INSULIN ASPART PROT & ASPART (70-30 MIX) 100 UNIT/ML ~~LOC~~ SUSP
60.0000 [IU] | Freq: Two times a day (BID) | SUBCUTANEOUS | Status: DC
  Administered 2020-11-23 – 2020-11-25 (×4): 60 [IU] via SUBCUTANEOUS
  Filled 2020-11-22 (×2): qty 10

## 2020-11-22 MED ORDER — ACETAMINOPHEN 325 MG PO TABS
325.0000 mg | ORAL_TABLET | ORAL | Status: DC | PRN
  Administered 2020-11-24: 650 mg via ORAL
  Filled 2020-11-22: qty 2

## 2020-11-22 MED ORDER — CEFAZOLIN IN SODIUM CHLORIDE 3-0.9 GM/100ML-% IV SOLN
3.0000 g | INTRAVENOUS | Status: AC
  Administered 2020-11-22 (×2): 3 g via INTRAVENOUS
  Filled 2020-11-22: qty 100

## 2020-11-22 MED ORDER — DEXTROSE 50 % IV SOLN: 12.5000 g | INTRAVENOUS | Status: AC

## 2020-11-22 MED ORDER — FENTANYL CITRATE (PF) 250 MCG/5ML IJ SOLN
INTRAMUSCULAR | Status: AC
  Filled 2020-11-22: qty 5

## 2020-11-22 MED ORDER — METOPROLOL SUCCINATE ER 50 MG PO TB24
50.0000 mg | ORAL_TABLET | Freq: Every day | ORAL | Status: DC
  Administered 2020-11-23 – 2020-11-25 (×3): 50 mg via ORAL
  Filled 2020-11-22 (×4): qty 1

## 2020-11-22 MED ORDER — ONDANSETRON HCL 4 MG/2ML IJ SOLN: 4.0000 mg | Freq: Four times a day (QID) | INTRAMUSCULAR | Status: DC | PRN

## 2020-11-22 MED ORDER — ROCURONIUM BROMIDE 100 MG/10ML IV SOLN
INTRAVENOUS | Status: DC | PRN
  Administered 2020-11-22 (×3): 20 mg via INTRAVENOUS
  Administered 2020-11-22: 80 mg via INTRAVENOUS
  Administered 2020-11-22 (×2): 20 mg via INTRAVENOUS

## 2020-11-22 MED ORDER — IODIXANOL 320 MG/ML IV SOLN
INTRAVENOUS | Status: DC | PRN
  Administered 2020-11-22: 50 mL
  Administered 2020-11-22: 36 mL

## 2020-11-22 MED ORDER — HEPARIN 6000 UNIT IRRIGATION SOLUTION
Status: DC | PRN
  Administered 2020-11-22: 1

## 2020-11-22 MED ORDER — ISOSORBIDE MONONITRATE ER 30 MG PO TB24
30.0000 mg | ORAL_TABLET | Freq: Every day | ORAL | Status: DC
  Administered 2020-11-23 – 2020-11-25 (×3): 30 mg via ORAL
  Filled 2020-11-22 (×3): qty 1

## 2020-11-22 MED ORDER — FENTANYL CITRATE (PF) 100 MCG/2ML IJ SOLN: 25.0000 ug | INTRAMUSCULAR | Status: DC | PRN

## 2020-11-22 MED ORDER — CLOPIDOGREL BISULFATE 75 MG PO TABS
75.0000 mg | ORAL_TABLET | Freq: Every day | ORAL | Status: DC
  Administered 2020-11-23 – 2020-11-25 (×3): 75 mg via ORAL
  Filled 2020-11-22 (×3): qty 1

## 2020-11-22 MED ORDER — VITAMIN B-12 1000 MCG PO TABS
1000.0000 ug | ORAL_TABLET | Freq: Every day | ORAL | Status: DC
  Administered 2020-11-23 – 2020-11-25 (×3): 1000 ug via ORAL
  Filled 2020-11-22 (×3): qty 1

## 2020-11-22 MED ORDER — APIXABAN 5 MG PO TABS
5.0000 mg | ORAL_TABLET | Freq: Two times a day (BID) | ORAL | Status: DC
  Administered 2020-11-23 – 2020-11-25 (×5): 5 mg via ORAL
  Filled 2020-11-22 (×5): qty 1

## 2020-11-22 MED ORDER — HYDROMORPHONE HCL 1 MG/ML IJ SOLN
INTRAMUSCULAR | Status: DC | PRN
  Administered 2020-11-22: .1 mg via INTRAVENOUS

## 2020-11-22 MED ORDER — CEFAZOLIN SODIUM 1 G IJ SOLR
INTRAMUSCULAR | Status: AC
  Filled 2020-11-22: qty 40

## 2020-11-22 MED ORDER — DULOXETINE HCL 30 MG PO CPEP
30.0000 mg | ORAL_CAPSULE | Freq: Every day | ORAL | Status: DC
  Administered 2020-11-23 – 2020-11-25 (×3): 30 mg via ORAL
  Filled 2020-11-22 (×3): qty 1

## 2020-11-22 MED ORDER — PROPOFOL 10 MG/ML IV BOLUS
INTRAVENOUS | Status: AC
  Filled 2020-11-22: qty 40

## 2020-11-22 MED ORDER — ALBUMIN HUMAN 5 % IV SOLN: INTRAVENOUS | Status: DC | PRN

## 2020-11-22 MED ORDER — DAPAGLIFLOZIN PROPANEDIOL 10 MG PO TABS
10.0000 mg | ORAL_TABLET | Freq: Every day | ORAL | Status: DC
  Administered 2020-11-23 – 2020-11-25 (×3): 10 mg via ORAL
  Filled 2020-11-22 (×3): qty 1

## 2020-11-22 MED ORDER — LIDOCAINE 2% (20 MG/ML) 5 ML SYRINGE
INTRAMUSCULAR | Status: DC | PRN
  Administered 2020-11-22: 80 mg via INTRAVENOUS

## 2020-11-22 MED ORDER — HEPARIN SODIUM (PORCINE) 1000 UNIT/ML IJ SOLN
INTRAMUSCULAR | Status: DC | PRN
  Administered 2020-11-22: 3000 [IU] via INTRAVENOUS
  Administered 2020-11-22: 2000 [IU] via INTRAVENOUS
  Administered 2020-11-22: 5000 [IU] via INTRAVENOUS
  Administered 2020-11-22: 3000 [IU] via INTRAVENOUS
  Administered 2020-11-22: 12000 [IU] via INTRAVENOUS

## 2020-11-22 MED ORDER — ONDANSETRON HCL 4 MG/2ML IJ SOLN: 4.0000 mg | Freq: Once | INTRAMUSCULAR | Status: DC | PRN

## 2020-11-22 MED ORDER — ALUM & MAG HYDROXIDE-SIMETH 200-200-20 MG/5ML PO SUSP: 15.0000 mL | ORAL | Status: DC | PRN

## 2020-11-22 MED ORDER — INSULIN LISPRO (1 UNIT DIAL) 100 UNIT/ML (KWIKPEN): 15.0000 [IU] | PEN_INJECTOR | Freq: Three times a day (TID) | SUBCUTANEOUS | Status: DC

## 2020-11-22 MED ORDER — INSULIN ASPART 100 UNIT/ML IJ SOLN
0.0000 [IU] | Freq: Three times a day (TID) | INTRAMUSCULAR | Status: DC
  Administered 2020-11-23 (×2): 11 [IU] via SUBCUTANEOUS
  Administered 2020-11-23: 3 [IU] via SUBCUTANEOUS
  Administered 2020-11-24 (×2): 7 [IU] via SUBCUTANEOUS
  Administered 2020-11-24 – 2020-11-25 (×2): 4 [IU] via SUBCUTANEOUS

## 2020-11-22 MED ORDER — SUGAMMADEX SODIUM 200 MG/2ML IV SOLN
INTRAVENOUS | Status: DC | PRN
  Administered 2020-11-22: 262.6 mg via INTRAVENOUS

## 2020-11-22 MED ORDER — LABETALOL HCL 5 MG/ML IV SOLN: 10.0000 mg | INTRAVENOUS | Status: DC | PRN

## 2020-11-22 MED ORDER — DEXTROSE 50 % IV SOLN
INTRAVENOUS | Status: AC
  Administered 2020-11-22: 12.5 g via INTRAVENOUS
  Filled 2020-11-22: qty 50

## 2020-11-22 MED ORDER — CHLORHEXIDINE GLUCONATE 0.12 % MT SOLN
15.0000 mL | Freq: Once | OROMUCOSAL | Status: AC
  Administered 2020-11-22: 15 mL via OROMUCOSAL
  Filled 2020-11-22: qty 15

## 2020-11-22 MED ORDER — BISACODYL 5 MG PO TBEC: 5.0000 mg | DELAYED_RELEASE_TABLET | Freq: Every day | ORAL | Status: DC | PRN

## 2020-11-22 MED ORDER — 0.9 % SODIUM CHLORIDE (POUR BTL) OPTIME
TOPICAL | Status: DC | PRN
  Administered 2020-11-22: 2000 mL

## 2020-11-22 SURGICAL SUPPLY — 111 items
ADH SKN CLS APL DERMABOND .7 (GAUZE/BANDAGES/DRESSINGS) ×6
APL PRP STRL LF DISP 70% ISPRP (MISCELLANEOUS)
BAG BANDED W/RUBBER/TAPE 36X54 (MISCELLANEOUS) ×4 IMPLANT
BAG COUNTER SPONGE SURGICOUNT (BAG) ×4 IMPLANT
BAG EQP BAND 135X91 W/RBR TAPE (MISCELLANEOUS) ×3
BAG SNAP BAND KOVER 36X36 (MISCELLANEOUS) ×4 IMPLANT
BAG SPNG CNTER NS LX DISP (BAG) ×3
BALLN MUSTANG 6X60X75 (BALLOONS) ×4
BALLN MUSTANG 6X80X135 (BALLOONS) ×4
BALLN STERLING OTW 3X60X150 (BALLOONS) ×8
BALLN STERLING OTW 6X60X135 (BALLOONS) ×4
BALLOON MUSTANG 6X60X75 (BALLOONS) IMPLANT
BALLOON MUSTANG 6X80X135 (BALLOONS) IMPLANT
BALLOON STERLING OTW 3X60X150 (BALLOONS) IMPLANT
BALLOON STERLING OTW 6X60X135 (BALLOONS) IMPLANT
BANDAGE ESMARK 6X9 LF (GAUZE/BANDAGES/DRESSINGS) IMPLANT
BLADE SURG 11 STRL SS (BLADE) ×4 IMPLANT
BNDG CMPR 9X6 STRL LF SNTH (GAUZE/BANDAGES/DRESSINGS) ×3
BNDG ESMARK 6X9 LF (GAUZE/BANDAGES/DRESSINGS) ×4
CANISTER SUCT 3000ML PPV (MISCELLANEOUS) ×4 IMPLANT
CANNULA VESSEL 3MM 2 BLNT TIP (CANNULA) ×3 IMPLANT
CATH ANGIO 5F BER2 100CM (CATHETERS) ×1 IMPLANT
CATH ANGIO 5F BER2 65CM (CATHETERS) ×1 IMPLANT
CATH OMNI FLUSH .035X70CM (CATHETERS) IMPLANT
CATH OMNI FLUSH 5F 65CM (CATHETERS) ×1 IMPLANT
CHLORAPREP W/TINT 26 (MISCELLANEOUS) IMPLANT
CLIP VESOCCLUDE MED 24/CT (CLIP) ×4 IMPLANT
CLIP VESOCCLUDE SM WIDE 24/CT (CLIP) ×4 IMPLANT
COVER DOME SNAP 22 D (MISCELLANEOUS) ×4 IMPLANT
COVER PROBE W GEL 5X96 (DRAPES) ×4 IMPLANT
COVER SURGICAL LIGHT HANDLE (MISCELLANEOUS) ×4 IMPLANT
CUFF TOURN SGL QUICK 24 (TOURNIQUET CUFF)
CUFF TOURN SGL QUICK 34 (TOURNIQUET CUFF) ×4
CUFF TOURN SGL QUICK 42 (TOURNIQUET CUFF) IMPLANT
CUFF TRNQT CYL 24X4X16.5-23 (TOURNIQUET CUFF) IMPLANT
CUFF TRNQT CYL 34X4.125X (TOURNIQUET CUFF) IMPLANT
DERMABOND ADVANCED (GAUZE/BANDAGES/DRESSINGS) ×2
DERMABOND ADVANCED .7 DNX12 (GAUZE/BANDAGES/DRESSINGS) ×6 IMPLANT
DEVICE TORQUE H2O (MISCELLANEOUS) IMPLANT
DRAIN CHANNEL 15F RND FF W/TCR (WOUND CARE) IMPLANT
DRAPE FEMORAL ANGIO 80X135IN (DRAPES) ×4 IMPLANT
DRAPE HALF SHEET 40X57 (DRAPES) IMPLANT
DRAPE X-RAY CASS 24X20 (DRAPES) IMPLANT
ELECT REM PT RETURN 9FT ADLT (ELECTROSURGICAL) ×4
ELECTRODE REM PT RTRN 9FT ADLT (ELECTROSURGICAL) ×3 IMPLANT
EVACUATOR SILICONE 100CC (DRAIN) IMPLANT
GAUZE 4X4 16PLY ~~LOC~~+RFID DBL (SPONGE) ×4 IMPLANT
GLOVE SURG POLYISO LF SZ7.5 (GLOVE) ×4 IMPLANT
GLOVE SURG UNDER POLY LF SZ7.5 (GLOVE) ×4 IMPLANT
GOWN STRL REUS W/ TWL LRG LVL3 (GOWN DISPOSABLE) ×6 IMPLANT
GOWN STRL REUS W/ TWL XL LVL3 (GOWN DISPOSABLE) ×3 IMPLANT
GOWN STRL REUS W/TWL LRG LVL3 (GOWN DISPOSABLE) ×8
GOWN STRL REUS W/TWL XL LVL3 (GOWN DISPOSABLE) ×4
GUIDEWIRE ANGLED .035X150CM (WIRE) IMPLANT
HEMOSTAT SNOW SURGICEL 2X4 (HEMOSTASIS) IMPLANT
INSERT FOGARTY SM (MISCELLANEOUS) IMPLANT
KIT BASIN OR (CUSTOM PROCEDURE TRAY) ×4 IMPLANT
KIT ENCORE 26 ADVANTAGE (KITS) ×1 IMPLANT
KIT MICROPUNCTURE NIT STIFF (SHEATH) ×1 IMPLANT
KIT TURNOVER KIT B (KITS) ×4 IMPLANT
MARKER GRAFT CORONARY BYPASS (MISCELLANEOUS) IMPLANT
NDL PERC 18GX7CM (NEEDLE) ×3 IMPLANT
NEEDLE PERC 18GX7CM (NEEDLE) ×4 IMPLANT
NS IRRIG 1000ML POUR BTL (IV SOLUTION) ×8 IMPLANT
PACK PERIPHERAL VASCULAR (CUSTOM PROCEDURE TRAY) ×4 IMPLANT
PACK SURGICAL SETUP 50X90 (CUSTOM PROCEDURE TRAY) ×4 IMPLANT
PAD ARMBOARD 7.5X6 YLW CONV (MISCELLANEOUS) ×8 IMPLANT
PADDING CAST COTTON 6X4 STRL (CAST SUPPLIES) ×1 IMPLANT
PROTECTION STATION PRESSURIZED (MISCELLANEOUS) ×4
SET COLLECT BLD 21X3/4 12 (NEEDLE) IMPLANT
SET MICROPUNCTURE 5F STIFF (MISCELLANEOUS) ×4 IMPLANT
SHEATH AVANTI 11CM 5FR (SHEATH) IMPLANT
SHEATH FLEXOR ANSEL 1 7F 45CM (SHEATH) ×1 IMPLANT
SPONGE T-LAP 18X18 ~~LOC~~+RFID (SPONGE) ×1 IMPLANT
STATION PROTECTION PRESSURIZED (MISCELLANEOUS) ×3 IMPLANT
STENT ELUVIA 7X60X130 (Permanent Stent) ×1 IMPLANT
STOPCOCK 4 WAY LG BORE MALE ST (IV SETS) IMPLANT
STOPCOCK MORSE 400PSI 3WAY (MISCELLANEOUS) ×4 IMPLANT
SUT ETHILON 3 0 PS 1 (SUTURE) IMPLANT
SUT GORETEX 6.0 TT13 (SUTURE) IMPLANT
SUT GORETEX 6.0 TT9 (SUTURE) IMPLANT
SUT PROLENE 5 0 C 1 24 (SUTURE) ×8 IMPLANT
SUT PROLENE 6 0 BV (SUTURE) ×7 IMPLANT
SUT PROLENE 7 0 BV 1 (SUTURE) IMPLANT
SUT PROLENE 7 0 BV1 MDA (SUTURE) ×1 IMPLANT
SUT SILK 2 0 (SUTURE) ×4
SUT SILK 2 0 SH (SUTURE) ×5 IMPLANT
SUT SILK 2-0 18XBRD TIE 12 (SUTURE) IMPLANT
SUT SILK 3 0 (SUTURE) ×4
SUT SILK 3-0 18XBRD TIE 12 (SUTURE) IMPLANT
SUT VIC AB 2-0 CT1 27 (SUTURE) ×20
SUT VIC AB 2-0 CT1 TAPERPNT 27 (SUTURE) ×6 IMPLANT
SUT VIC AB 3-0 SH 27 (SUTURE) ×16
SUT VIC AB 3-0 SH 27X BRD (SUTURE) ×6 IMPLANT
SUT VIC AB 4-0 PS2 18 (SUTURE) ×2 IMPLANT
SUT VICRYL 4-0 PS2 18IN ABS (SUTURE) ×6 IMPLANT
SYR 10ML LL (SYRINGE) ×12 IMPLANT
SYR 20ML LL LF (SYRINGE) ×4 IMPLANT
SYR 30ML LL (SYRINGE) ×4 IMPLANT
SYR 3ML LL SCALE MARK (SYRINGE) ×2 IMPLANT
SYR MEDRAD MARK V 150ML (SYRINGE) IMPLANT
TOWEL GREEN STERILE (TOWEL DISPOSABLE) ×8 IMPLANT
TRAY FOLEY MTR SLVR 16FR STAT (SET/KITS/TRAYS/PACK) ×4 IMPLANT
TUBING EXTENTION W/L.L. (IV SETS) IMPLANT
TUBING HIGH PRESSURE 120CM (CONNECTOR) ×4 IMPLANT
UNDERPAD 30X36 HEAVY ABSORB (UNDERPADS AND DIAPERS) ×4 IMPLANT
WATER STERILE IRR 1000ML POUR (IV SOLUTION) ×4 IMPLANT
WIRE BENTSON .035X145CM (WIRE) ×4 IMPLANT
WIRE G V18X300CM (WIRE) ×3 IMPLANT
WIRE HI TORQ VERSACORE J 260CM (WIRE) ×1 IMPLANT
WIRE STARTER BENTSON 035X150 (WIRE) ×1 IMPLANT

## 2020-11-22 NOTE — Anesthesia Procedure Notes (Signed)
Procedure Name: Intubation Date/Time: 11/22/2020 2:48 PM Performed by: Eligha Bridegroom, CRNA Pre-anesthesia Checklist: Patient identified, Emergency Drugs available, Suction available and Patient being monitored Patient Re-evaluated:Patient Re-evaluated prior to induction Oxygen Delivery Method: Circle System Utilized Preoxygenation: Pre-oxygenation with 100% oxygen Induction Type: IV induction Laryngoscope Size: Glidescope and 4 Grade View: Grade II Tube type: Oral Tube size: 8.0 mm Number of attempts: 1 Airway Equipment and Method: Stylet Placement Confirmation: ETT inserted through vocal cords under direct vision, positive ETCO2 and breath sounds checked- equal and bilateral Tube secured with: Tape Dental Injury: Teeth and Oropharynx as per pre-operative assessment

## 2020-11-22 NOTE — Transfer of Care (Signed)
Immediate Anesthesia Transfer of Care Note  Patient: [name] [name]  Procedure(s) Performed: LEFT FEMORAL-POPLITEAL BYPASS GRAFT (Left: Leg Upper) RIGHT LEG ANGIOGRAM (Right: Groin) INSERTION OF ELUVIA STENT INTO RIGHT DISTAL SUPERFICIAL FEMORAL ARTERY (Right: Leg Lower)  Patient Location: PACU  Anesthesia Type:General  Level of Consciousness: drowsy, patient cooperative and responds to stimulation  Airway & Oxygen Therapy: Patient Spontanous Breathing and Patient connected to face mask oxygen  Post-op Assessment: Report given to RN, Post -op Vital signs reviewed and stable and Patient moving all extremities X 4  Post vital signs: Reviewed and stable  Last Vitals:  Vitals Value Taken Time  BP 157/77   Temp    Pulse 93 11/22/20 2204  Resp 15 11/22/20 2204  SpO2 100 % 11/22/20 2204  Vitals shown include unvalidated device data.  Last Pain:  Vitals:   11/22/20 1023  TempSrc:   PainSc: 10-Worst pain ever      Patients Stated Pain Goal: 3 (99/69/24 9324)  Complications: No notable events documented.

## 2020-11-22 NOTE — Op Note (Signed)
Patient name: [name] [name] MRN: [id number] DOB: Jun 22, 1950 Sex: male  11/22/2020 Pre-operative Diagnosis: Bilateral lower extremity ulcers Post-operative diagnosis:  Same Surgeon:  Annamarie Major Assistants: Risa Grill PA Procedure:   #1: Left common femoral to above-knee popliteal artery bypass graft with reversed ipsilateral translocated saphenous vein   #2: Right lower extremity angiogram   #3: Stent, right superficial femoral artery Anesthesia: General Blood Loss: 250 Specimens: None  Findings:  4 mm left saphenous vein.  The left proximal anastomosis was to the distal left common femoral artery.  The left distal anastomosis was to a mildly calcified but widely patent above-knee popliteal artery.  On the right, there was a 80% stenosis just distal to the previously placed stent.  A 7 x 60 Elluvia stent was placed that resolved the stenosis.  The patient has a proximal peroneal artery occlusion which is his dominant runoff.  The peroneal artery reconstitutes from collaterals.  I was unable to cross the occlusion.  If he continues to have difficulty, the next option would be a retrograde approach possibly from the posterior tibial or the peroneal artery.  Indications: This is a 71 year old gentleman with bilateral ulcers.  He has previously undergone stenting of his right superficial femoral artery.  I was unable to recanalize the left and so surgical bypass was recommended.  On his most recent angiogram, there is a high-grade stenosis in the distal right superficial femoral artery.  I discussed treating both legs today which I would not normally do, however because of ulcers on both legs I felt the most appropriate action was to stent the right superficial femoral artery.  He is not a candidate for antegrade access because of his abdomen.  I did not want to wait several months for his groin to heal on the left.  Procedure:  The patient was identified in the holding area and taken to [place] [address]  The patient was then placed supine on the table. general anesthesia was administered.  The patient was prepped and draped in the usual sterile fashion.  A time out was called and antibiotics were administered.  A PA was necessary to expedite the procedure and assist with technical details.  An oblique incision was made in the left groin.  Cautery was used divide subcutaneous tissue.  Exposure was somewhat difficult because of his pannus.  But ultimately I was able to expose the common femoral artery beginning at the inguinal ligament down to the bifurcation.  The superficial femoral and profundofemoral arteries were individually isolated.  There was an excellent pulse within the common femoral artery, with mild circumferential calcification.  Through this incision I identified the saphenofemoral junction and continued to mobilize the saphenous vein down to the knee via skip incisions.  The most distal incision, I also exposed the popliteal artery.  This was also circumferentially calcified but appears soft enough for an anastomosis.  Next, the vein was ligated proximally and distally and removed and placed on the back table where it was prepared and marked for orientation.  I then used a curved Gore tunneler to create a subsartorial tunnel between the proximal and distal incision.  At this point the patient was fully heparinized.  Next, I cannulated the common femoral artery with a micropuncture needle and inserted a 018 wire followed by micropuncture sheath.  A Bentson wire was inserted and then a 745 Ansell 1 sheath was placed.  The aortic bifurcation was crossed with a Omni Flush catheter and  a Bentson wire.  The dilator and the sheath was replaced and the sheath was advanced over the bifurcation and placed in the right common femoral artery.  A right lower extremity angiogram was performed.  This showed a patent superficial femoral artery however with the distal edge of the previously placed stent,  there was a 80 to 90% stenosis.  This was a short focal lesion.  The popliteal artery was patent throughout its course.  There was occlusion of all 3 tibial vessels.  Approximately 8 cm below the tibial occlusions, the peroneal artery reconstituted and was patent down to the ankle and sent collaterals to the posterior tibial artery.  Next, a 018 wire was used to cross the lesion in the superficial femoral artery.  I used a 3 x 60 Sterling balloon as a support catheter.  I tried to recanalize the occluded peroneal artery, however this was unsuccessful.  I felt that a retrograde approach would probably be the next option however I elected not to do this at this time.  I then treated the superficial femoral artery stenosis by placing a 7 x 60 Elluvia stent and postdilated it with a 6 mm balloon.  There was approximately a 2 cm overlap between the previous stent and a new stent.  Completion imaging showed a widely patent superficial femoral-popliteal artery with similar runoff.  I then removed the sheath.  The common femoral artery was occluded with Vesseloops.  I then extended the arteriotomy with Potts scissors in a longitudinal fashion.  The vein was brought on the table and spatulated to fit the size of the arteriotomy.  A running anastomosis was created with 5-0 Prolene.  The clamps were released.  A valvulotome was used to lyse the valves.  There was excellent flow through the vein graft.  The vein graft was then pulled through the previously created tunnel making sure to maintain proper orientation.  A tourniquet was placed in the upper thigh and the leg was exsanguinated with an Esmarch and the tourniquet was inflated to 250 mm of pressure.  I then opened the above-knee popliteal artery with an 11 blade and extended this longitudinally with Potts scissors.  The vein graft was cut the appropriate length and spatulated to fit size arteriotomy.  Running anastomosis was created with 6-0 Prolene.  Just prior to  completion the appropriate flushing maneuvers were performed.  The tourniquet was let down.  The anastomosis was completed.  There was an excellent pulse within the vein graft and a good Doppler signal in the popliteal artery below this.  I was able to get a faint peroneal and posterior tibial signal.  There was also a posterior tibial signal on the right.  The patient's heparin was reversed with protamine.  The wounds were irrigated.  The vein harvest incisions were closed with a deep layer 3-0 Vicryl followed by subcuticular closure.  The below-knee incision was closed by reapproximating the fascia with 2-0 Vicryl and the vein harvest space with 2-0 Vicryl.  The skin was closed with subcuticular stitch.  The groin was then irrigated.  Hemostasis was achieved.  The femoral sheath was reapproximated with 2-0 Vicryl.  Subcutaneous tissue was then closed with multiple layers of Vicryl followed by subcuticular closure.  I then listen for signals in the leg bilaterally.  I was able to get a faint posterior tibial signal on the right.  I had difficulty getting a posterior tibial and peroneal signal on the left.  We went through multiple  Doppler cords and boxes.  I felt that this was a issue with the Doppler cord rather than bypass graft occlusion, however in order to confirm this, I reopened the distal incision.  There was a excellent pulse within the vein graft.  I then reclosed the above-knee incision by reapproximating the fascia with 2-0 Vicryl and the skin with subcuticular stitch.  Dermabond was placed on the incisions.  The patient was successfully extubated taken to recovery in stable condition.  There were no immediate complications.   Disposition: To PACU stable.   Theotis Burrow, M.D., [facility] [place] Office: [phone] Pager:  [phone]

## 2020-11-22 NOTE — H&P (Signed)
   Patient name: [name] [name] MRN: [id number] DOB: 1951-04-10 Sex: male    HISTORY OF PRESENT ILLNESS:   [name] [name] is a 70 y.o. male with bilateral lower extremity ulcers.  He has previously undergone percutaneous revascularization with stenting of his right superficial femoral artery.  He has a new ulcer on his left leg.  Was unable to cross the lesion and so femoral-popliteal bypass grafting was recommended.  He also has stenosis within the right leg that needs to be addressed percutaneously.   The patient has a history of coronary artery disease.  He underwent DES in 2010 in 2018.  He suffers from atrial fibrillation.  He is on Eliquis and has a loop recorder implanted.  He is statin and Repatha intolerant.  He is currently involved in a research study to address his hypercholesterolemia.  He is a diabetic.    CURRENT MEDICATIONS:    Current Facility-Administered Medications  Medication Dose Route Frequency Provider Last Rate Last Admin   0.9 %  sodium chloride infusion   Intravenous Continuous Dara Beidleman, Butch Penny, MD       ceFAZolin (ANCEF) IVPB 3g/100 mL premix  3 g Intravenous 30 min Pre-Op [name], MD       Chlorhexidine Gluconate Cloth 2 % PADS 6 each  6 each Topical Once [name], MD       And   Chlorhexidine Gluconate Cloth 2 % PADS 6 each  6 each Topical Once [name], MD       lactated ringers infusion   Intravenous Continuous Annye Asa, MD 10 mL/hr at 11/22/20 1025 New Bag at 11/22/20 1025    REVIEW OF SYSTEMS:   [X]  denotes positive finding, [ ]  denotes negative finding Cardiac  Comments:  Chest pain or chest pressure:    Shortness of breath upon exertion:    Short of breath when lying flat:    Irregular heart rhythm:    Constitutional    Fever or chills:      PHYSICAL EXAM:   Vitals:   11/22/20 1010  BP: (!) 170/62  Pulse: 67  Resp: 18  Temp: 97.6 F (36.4 C)  TempSrc: Oral  SpO2: 97%   Weight: 131.3 kg  Height: 6\' 2"  (1.88 m)    GENERAL: The patient is a well-nourished male, in no acute distress. The vital signs are documented above. CARDIOVASCULAR: There is a regular rate and rhythm. PULMONARY: Non-labored respirations   STUDIES:      MEDICAL ISSUES:   We discussed proceeding with left femoral above-knee popliteal bypass graft.  Simultaneously I will reevaluate the right leg and consider right superficial femoral stenting and peroneal artery intervention.  All questions were answered.  Leia Alf, MD, FACS [facility] [phone] Pager [phone]

## 2020-11-22 NOTE — Progress Notes (Signed)
Patient arrived from PACU on a hospital bed , assessment completed see flow sheet, placed on tele ccmd notified, patient oriented to room and staff, bed in lowest position call bell within reach will continue to monitor.

## 2020-11-22 NOTE — Anesthesia Postprocedure Evaluation (Signed)
Anesthesia Post Note  Patient: Kaisen A Hartnett  Procedure(s) Performed: LEFT FEMORAL-POPLITEAL BYPASS GRAFT (Left: Leg Upper) RIGHT LEG ANGIOGRAM (Right: Groin) INSERTION OF ELUVIA STENT INTO RIGHT DISTAL SUPERFICIAL FEMORAL ARTERY (Right: Leg Lower)     Patient location during evaluation: PACU Anesthesia Type: General Level of consciousness: awake and alert Pain management: pain level controlled Vital Signs Assessment: post-procedure vital signs reviewed and stable Respiratory status: spontaneous breathing, nonlabored ventilation, respiratory function stable and patient connected to nasal cannula oxygen Cardiovascular status: blood pressure returned to baseline and stable Postop Assessment: no apparent nausea or vomiting Anesthetic complications: no   No notable events documented.  Last Vitals:  Vitals:   11/22/20 2321 11/22/20 2324  BP: (!) 104/58 116/63  Pulse:  76  Resp: 16 13  Temp: 36.6 C   SpO2: 95% 92%    Last Pain:  Vitals:   11/22/20 2321  TempSrc: Oral  PainSc:                  Evren Shankland S

## 2020-11-22 NOTE — Progress Notes (Signed)
Upon arrival to PACU patient with dopplerable bilateral PT and DP pulses.  Pulses marked and both feet wrapped in warm blankets.  Will continue to monitor.

## 2020-11-22 NOTE — Progress Notes (Signed)
Hypoglycemic Event  CBG: 73  Treatment: D50 25 mL (12.5 gm)- per verbal order from Dr. Fransisco Beau  Symptoms: None  Follow-up CBG: Time: 12:45 CBG Result:119  Possible Reasons for Event: Unknown  Comments/MD notified: Dr. Bonnita Hollow, BSN

## 2020-11-22 NOTE — Anesthesia Procedure Notes (Signed)
Arterial Line Insertion Start/End7/12/2020 12:20 PM Performed by: Fulton Reek, CRNA, CRNA  Patient location: Pre-op. Preanesthetic checklist: patient identified, IV checked, site marked, risks and benefits discussed, surgical consent, monitors and equipment checked, pre-op evaluation, timeout performed and anesthesia consent Lidocaine 1% used for infiltration Left, radial was placed Catheter size: 20 G Hand hygiene performed  and maximum sterile barriers used   Attempts: 1 Procedure performed without using ultrasound guided technique. Following insertion, dressing applied and Biopatch. Post procedure assessment: normal and unchanged  Patient tolerated the procedure well with no immediate complications.

## 2020-11-23 ENCOUNTER — Inpatient Hospital Stay (HOSPITAL_COMMUNITY): Payer: Medicare Other

## 2020-11-23 DIAGNOSIS — R270 Ataxia, unspecified: Secondary | ICD-10-CM | POA: Diagnosis not present

## 2020-11-23 LAB — GLUCOSE, CAPILLARY
Glucose-Capillary: 271 mg/dL — ABNORMAL HIGH (ref 70–99)
Glucose-Capillary: 227 mg/dL — ABNORMAL HIGH (ref 70–99)
Glucose-Capillary: 150 mg/dL — ABNORMAL HIGH (ref 70–99)
Glucose-Capillary: 135 mg/dL — ABNORMAL HIGH (ref 70–99)

## 2020-11-23 LAB — HEMOGLOBIN A1C
Hgb A1c MFr Bld: 8.3 % — ABNORMAL HIGH (ref 4.8–5.6)
Mean Plasma Glucose: 191.51 mg/dL

## 2020-11-23 LAB — BASIC METABOLIC PANEL
Anion gap: 6 (ref 5–15)
BUN: 20 mg/dL (ref 8–23)
CO2: 26 mmol/L (ref 22–32)
Calcium: 8.8 mg/dL — ABNORMAL LOW (ref 8.9–10.3)
Chloride: 103 mmol/L (ref 98–111)
Creatinine, Ser: 1.25 mg/dL — ABNORMAL HIGH (ref 0.61–1.24)
GFR, Estimated: >60 mL/min (ref >60)
Glucose, Bld: 232 mg/dL — ABNORMAL HIGH (ref 70–99)
Potassium: 4.4 mmol/L (ref 3.5–5.1)
Sodium: 135 mmol/L (ref 135–145)

## 2020-11-23 LAB — LIPID PANEL
Cholesterol: 176 mg/dL (ref 0–200)
HDL: 17 mg/dL — ABNORMAL LOW (ref >40)
LDL Cholesterol: 92 mg/dL (ref 0–99)
Total CHOL/HDL Ratio: 10.4 RATIO
Triglycerides: 335 mg/dL — ABNORMAL HIGH (ref <150)
VLDL: 67 mg/dL — ABNORMAL HIGH (ref 0–40)

## 2020-11-23 LAB — CBC
HCT: 36.4 % — ABNORMAL LOW (ref 39.0–52.0)
Hemoglobin: 11.6 g/dL — ABNORMAL LOW (ref 13.0–17.0)
MCH: 27.7 pg (ref 26.0–34.0)
MCHC: 31.9 g/dL (ref 30.0–36.0)
MCV: 86.9 fL (ref 80.0–100.0)
Platelets: 259 10*3/uL (ref 150–400)
RBC: 4.19 MIL/uL — ABNORMAL LOW (ref 4.22–5.81)
RDW: 14 % (ref 11.5–15.5)
WBC: 15.5 10*3/uL — ABNORMAL HIGH (ref 4.0–10.5)
nRBC: 0 % (ref 0.0–0.2)

## 2020-11-23 MED ORDER — LORAZEPAM 2 MG/ML IJ SOLN: 2.0000 mg | Freq: Once | INTRAMUSCULAR | Status: DC

## 2020-11-23 MED ORDER — STROKE: EARLY STAGES OF RECOVERY BOOK
Freq: Once | Status: AC
  Filled 2020-11-23: qty 1

## 2020-11-23 NOTE — Progress Notes (Signed)
Mobility Specialist: Progress Note   11/23/20 1725  Mobility  Activity Refused mobility   Pt refused mobility d/t feeling tired. Pt difficult to arouse. Will f/u tomorrow.   [facility] Granvil Djordjevic Mobility Specialist Mobility Specialist Phone: [phone]

## 2020-11-23 NOTE — Progress Notes (Signed)
PHARMACIST LIPID MONITORING   [name] [name] is a 70 y.o. male admitted on 11/22/2020 with bilateral lower extremity ulcers.  Pharmacy has been consulted to optimize lipid-lowering therapy with the indication of secondary prevention for clinical ASCVD.  Recent Labs:  Lipid Panel (last 6 months):   Lab Results  Component Value Date   CHOL 176 11/23/2020   TRIG 335 (H) 11/23/2020   HDL 17 (L) 11/23/2020   CHOLHDL 10.4 11/23/2020   VLDL 67 (H) 11/23/2020   LDLCALC 92 11/23/2020    Hepatic function panel (last 6 months):   Lab Results  Component Value Date   AST 23 11/20/2020   ALT 21 11/20/2020   ALKPHOS 47 11/20/2020   BILITOT 0.9 11/20/2020    SCr (since admission):   Serum creatinine: 1.25 mg/dL (H) 11/23/20 0000 Estimated creatinine clearance: 79.2 mL/min (A)  Current therapy and lipid therapy tolerance Current lipid-lowering therapy: none Previous lipid-lowering therapies (if applicable): pravastatin, rosuvastatin, fenofibrate, atorvastatin, pitavastatin, repatha Documented or reported allergies or intolerances to lipid-lowering therapies (if applicable): myalgias to all agents previously listed  Assessment:   Patient prefers no changes in lipid-lowering therapy at this time due to intolerances. Patient is being followed by lipid clinic and patient to follow up if interested in trying new therapies.   Plan:    1.Statin intensity (high intensity recommended for all patients regardless of the LDL):  Statin intolerance noted. No statin changes due to serious side effects (ex. Myalgias with at least 2 different statins).  2.Add ezetimibe (if any one of the following):   Cannot tolerate statin at any dose.  3.Refer to lipid clinic:   No  4.Follow-up with:  Primary care provider - [name], FNP  5.Follow-up labs after discharge:  No changes in lipid therapy, repeat a lipid panel in one year.     Erin Hearing PharmD., BCPS Clinical Pharmacist 11/23/2020 2:44 PM

## 2020-11-23 NOTE — Consult Note (Addendum)
Neurology Consultation  Reason for Consult: LUE numbness and ataxia. Referring Physician: Vascular surgeon.   CC: woke up with left thumb, index, and middle finger numbness. Ataxic on PT evaluation.   History is obtained from: Patient, wife.   HPI: [name] [name] is a 70 y.o. male DM II with complications of neuropathy and retinopathy, morbid obesity, left basal gangia lacunar infarct, AF, Vitamin B12 deficiency, syncope, depression, OSA with non compliance with CPAP, dCHF, GERD, bilateral LE ulcers s/p percutaneous revascularization with stenting of his right superficial femoral artery, CAD s/p MI and DES in 2010 and 2018, HLD with intolerance to statins and Repatha. Due to new leg ulcer, patient underwent a left femoral above knee popliteal bypass graft, RLE angiogram, right superficial femoral artery stenting on 11/22/20. His Eliquis was held for a week prior to surgery, restarted today along with his chronic Plavix.   Patient states that when he awoke this am, he had to wait a bit to get his arm "started". Soon after that, he was able to move LUE. He states at the same time, he had left thumb, left index, and left middle finger numbness but worse in the thumb. The numbness traveled at the radius up to mid forearm. No numbness/tingling to face, RUE, LEs, or LUE above mid forearm. PT noticed patient was ataxic on LUE. Now, patient states that his left thumb and index finger feel a little numb and his left middle finger is back to normal. He states he is usually not clumsy with his LUE at home, but sometimes has difficulty holding items due to his arthritis.   He says that one time, when he had a MI, he had a MRI brain which showed an old stroke. He followed with Dr. Jaynee Eagles since then. Last OV with Jaynee Eagles 12/2019. Follows with cardiology for AF and CAD/stenting with last OV 10/16/20. Last hospitalization 05/25/20 for NSTEMI.   ROS: A robust ROS was performed and is negative except as noted in the HPI.    Past  Medical History:  Diagnosis Date   Anxiety    Arthritis    Atrial fibrillation (HCC)    CAD (coronary artery disease)    a. 2010: DES to CTO of RCA. EF 55% b. 07/2016: cath showing total occlusion within previously placed RCA stent (collaterals present), severe stenosis along LCx and OM1 (treated with 2 overlapping DES). c. repeat cath in 01/2018 showing patent stents along LCx and OM with CTO of D2, CTO of distal LCx, and CTO of RCA with collaterals present overall unchanged since 2018 with medical management recom   Cellulitis and abscess rt groin    Complication of anesthesia    " I woke up during a colonoscopy "      Depression    Diabetes mellitus    Diastolic CHF ([place])    Disorders of iron metabolism    Dysrhythmia    GERD (gastroesophageal reflux disease)    Hyperlipidemia    Hypertension    Low serum testosterone level    Medically noncompliant    Myocardial infarction [facility])     Family History  Problem Relation Age of Onset   Diabetes Father    Valvular heart disease Father    Arthritis Father    Heart disease Father    Stroke Father    Alzheimer's disease Mother    Hyperlipidemia Mother    Hypertension Mother    Arthritis Mother    Lung cancer Mother    Stroke Mother  Headache Mother    Arthritis/Rheumatoid Sister    Diabetes Sister    Hypertension Sister    Hyperlipidemia Sister    Depression Sister    Dementia Maternal Aunt    Dementia Maternal Uncle    Heart disease Maternal Uncle    Stomach cancer Paternal Uncle    Colon cancer Neg Hx    Liver disease Neg Hx     Social History:   reports that he quit smoking about 4 years ago. His smoking use included cigarettes. He has a 12.75 pack-year smoking history. He has never used smokeless tobacco. He reports that he does not drink alcohol and does not use drugs.  Medications  Current Facility-Administered Medications:    0.9 %  sodium chloride infusion, 500 mL, Intravenous, Once PRN, Setzer, Edman Circle,  PA-C   0.9 %  sodium chloride infusion, , Intravenous, Continuous, Setzer, Edman Circle, PA-C, Last Rate: 125 mL/hr at 11/22/20 2351, New Bag at 11/22/20 2351   acetaminophen (TYLENOL) tablet 325-650 mg, 325-650 mg, Oral, Q4H PRN **OR** acetaminophen (TYLENOL) suppository 325-650 mg, 325-650 mg, Rectal, Q4H PRN, Setzer, Edman Circle, PA-C   albuterol (PROVENTIL) (2.5 MG/3ML) 0.083% nebulizer solution 2.5 mg, 2.5 mg, Inhalation, Q6H PRN, Setzer, Sandra J, PA-C   alum & mag hydroxide-simeth (MAALOX/MYLANTA) 200-200-20 MG/5ML suspension 15-30 mL, 15-30 mL, Oral, Q2H PRN, Setzer, Edman Circle, PA-C   apixaban (ELIQUIS) tablet 5 mg, 5 mg, Oral, BID, Setzer, Edman Circle, PA-C, 5 mg at 11/23/20 4098   bisacodyl (DULCOLAX) EC tablet 5 mg, 5 mg, Oral, Daily PRN, Vaughan Basta, Sandra J, PA-C   clopidogrel (PLAVIX) tablet 75 mg, 75 mg, Oral, Daily, Setzer, Edman Circle, PA-C, 75 mg at 11/23/20 0957   dapagliflozin propanediol (FARXIGA) tablet 10 mg, 10 mg, Oral, QAC breakfast, Setzer, Sandra J, PA-C, 10 mg at 11/23/20 0957   docusate sodium (COLACE) capsule 100 mg, 100 mg, Oral, Daily, Setzer, Sandra J, PA-C, 100 mg at 11/23/20 0957   DULoxetine (CYMBALTA) DR capsule 30 mg, 30 mg, Oral, Daily, Setzer, Sandra J, PA-C, 30 mg at 11/23/20 0957   gabapentin (NEURONTIN) capsule 900 mg, 900 mg, Oral, TID, Setzer, Sandra J, PA-C, 900 mg at 11/23/20 1540   guaiFENesin-dextromethorphan (ROBITUSSIN DM) 100-10 MG/5ML syrup 15 mL, 15 mL, Oral, Q4H PRN, Setzer, Edman Circle, PA-C   hydrALAZINE (APRESOLINE) injection 5 mg, 5 mg, Intravenous, Q20 Min PRN, Setzer, Sandra J, PA-C   insulin aspart (novoLOG) injection 0-20 Units, 0-20 Units, Subcutaneous, TID WC, Setzer, Edman Circle, PA-C, 3 Units at 11/23/20 1546   insulin aspart protamine- aspart (NOVOLOG MIX 70/30) injection 60 Units, 60 Units, Subcutaneous, BID WC, Setzer, Edman Circle, PA-C, 60 Units at 11/23/20 1540   isosorbide mononitrate (IMDUR) 24 hr tablet 30 mg, 30 mg, Oral, Daily, Setzer, Sandra J,  PA-C, 30 mg at 11/23/20 0957   labetalol (NORMODYNE) injection 10 mg, 10 mg, Intravenous, Q10 min PRN, Setzer, Edman Circle, PA-C   magnesium sulfate IVPB 2 g 50 mL, 2 g, Intravenous, Daily PRN, Setzer, Edman Circle, PA-C   metoprolol succinate (TOPROL-XL) 24 hr tablet 50 mg, 50 mg, Oral, Daily, Setzer, Sandra J, PA-C, 50 mg at 11/23/20 0956   metoprolol tartrate (LOPRESSOR) injection 2-5 mg, 2-5 mg, Intravenous, Q2H PRN, Setzer, Edman Circle, PA-C   morphine 2 MG/ML injection 2 mg, 2 mg, Intravenous, Q4H PRN, Barbie Banner, PA-C, 2 mg at 11/23/20 1222   ondansetron (ZOFRAN) injection 4 mg, 4 mg, Intravenous, Q6H PRN, Setzer, Edman Circle, PA-C   oxyCODONE-acetaminophen (  PERCOCET/ROXICET) 5-325 MG per tablet 1-2 tablet, 1-2 tablet, Oral, Q4H PRN, Barbie Banner, PA-C, 2 tablet at 11/23/20 1539   pantoprazole (PROTONIX) EC tablet 40 mg, 40 mg, Oral, Daily, Barbie Banner, PA-C, 40 mg at 11/23/20 0956   phenol (CHLORASEPTIC) mouth spray 1 spray, 1 spray, Mouth/Throat, PRN, Setzer, Sandra J, PA-C   polyethylene glycol (MIRALAX / GLYCOLAX) packet 17 g, 17 g, Oral, Daily PRN, Vaughan Basta, Sandra J, PA-C   potassium chloride SA (KLOR-CON) CR tablet 20-40 mEq, 20-40 mEq, Oral, Daily PRN, Setzer, Edman Circle, PA-C   vitamin B-12 (CYANOCOBALAMIN) tablet 1,000 mcg, 1,000 mcg, Oral, Daily, Barbie Banner, PA-C, 1,000 mcg at 11/23/20 1601   Exam: Current vital signs: BP 127/83 (BP Location: Left Arm)   Pulse 85   Temp 99.1 F (37.3 C) (Oral)   Resp 13   Ht 6\' 2"  (1.88 m)   Wt 131.3 kg   SpO2 95% Comment: 95  BMI 37.15 kg/m  Vital signs in last 24 hours: Temp:  [97.2 F (36.2 C)-99.1 F (37.3 C)] 99.1 F (37.3 C) (07/09 1548) Pulse Rate:  [38-95] 85 (07/09 1548) Resp:  [9-20] 13 (07/09 1219) BP: (104-160)/(58-83) 127/83 (07/09 1548) SpO2:  [92 %-100 %] 95 % (07/09 1548) Arterial Line BP: (110-176)/(51-80) 154/63 (07/09 0215)  PE: GENERAL: Morbidly obese male who is chronically ill appearing, but well.  Awake, alert in NAD.  HEENT: normocephalic and atraumatic. LUNGS - Normal respiratory effort.  CV - RRR on tele. ABDOMEN - Soft, nontender. Ext: warm, well perfused. Psych: affect light, laughing and telling jokes.   NEURO:  Mental Status: Alert and oriented x 3.  Speech/Language: speech is without dysarthria or aphasia.  Naming, repetition, fluency, and comprehension intact.  Cranial Nerves:  II: PERRL  Visual fields full.  III, IV, VI: EOMI. Lid elevation symmetric and full.  V: sensation is intact and symmetrical to face.  VII: Smile is symmetrical. Able to puff cheeks and raise eyebrows.  VIII:hearing intact to voice. IX, X: palate elevation is symmetric. Phonation normal.  XI: normal sternocleidomastoid and trapezius muscle strength. UXN:ATFTDD is symmetrical without fasciculations.   Motor:  RUE/LUE 5/5 grip, triceps and biceps.  RLE: 5/5.  LLE: can not participate due to surgical pain.       Tone is normal. Bulk is increased.  Sensation- Intact to light touch bilaterally in all four extremities. Extinction absent to light touch to DSS.  Coordination: FTN intact on right. Ataxic on left. No pronator drift.  Pain from OA in LUE shoulder interferes with parts of exam.  DTRs:  1+ UEs.  Gait- deferred   Labs I have reviewed labs in epic and the results pertinent to this consultation are:  CBC    Component Value Date/Time   WBC 15.5 (H) 11/23/2020 0000   RBC 4.19 (L) 11/23/2020 0000   HGB 11.6 (L) 11/23/2020 0000   HGB 14.3 08/19/2020 0925   HCT 36.4 (L) 11/23/2020 0000   HCT 43.6 08/19/2020 0925   PLT 259 11/23/2020 0000   PLT 370 08/19/2020 0925   MCV 86.9 11/23/2020 0000   MCV 83 08/19/2020 0925   MCH 27.7 11/23/2020 0000   MCHC 31.9 11/23/2020 0000   RDW 14.0 11/23/2020 0000   RDW 14.2 08/19/2020 0925   LYMPHSABS 1.8 08/19/2020 0925   MONOABS 0.6 06/06/2020 0433   EOSABS 0.2 08/19/2020 0925   BASOSABS 0.1 08/19/2020 0925    CMP     Component Value  Date/Time  NA 135 11/23/2020 0000   NA 140 08/19/2020 0925   K 4.4 11/23/2020 0000   CL 103 11/23/2020 0000   CO2 26 11/23/2020 0000   GLUCOSE 232 (H) 11/23/2020 0000   BUN 20 11/23/2020 0000   BUN 25 08/19/2020 0925   CREATININE 1.25 (H) 11/23/2020 0000   CALCIUM 8.8 (L) 11/23/2020 0000   PROT 8.2 (H) 11/20/2020 0936   PROT 7.6 08/19/2020 0925   ALBUMIN 4.2 11/20/2020 0936   ALBUMIN 4.6 08/19/2020 0925   AST 23 11/20/2020 0936   ALT 21 11/20/2020 0936   ALKPHOS 47 11/20/2020 0936   BILITOT 0.9 11/20/2020 0936   BILITOT 0.6 08/19/2020 0925   GFRNONAA >60 11/23/2020 0000   GFRAA 54 (L) 06/20/2020 1529    Lipid Panel     Component Value Date/Time   CHOL 176 11/23/2020 0000   CHOL 232 (H) 02/14/2020 1135   TRIG 335 (H) 11/23/2020 0000   HDL 17 (L) 11/23/2020 0000   HDL 15 (L) 02/14/2020 1135   CHOLHDL 10.4 11/23/2020 0000   VLDL 67 (H) 11/23/2020 0000   LDLCALC 92 11/23/2020 0000   LDLCALC 108 (H) 02/14/2020 1135   LDLDIRECT 172 (H) 11/08/2014 0908   Imaging Patient needs MRI brain, but is highly reluctant.   Assessment: 70 yo male with multiple comorbidities and stroke risk factors of DM II, old stroke, OSA, HTN, CAD and HLD. His symptoms are likely due to stroke. He is very hesitant to have MRI brain due to claustrophobia. States he will try with sedation. He is on both Eliquis and Plavix by cardiology. Secondary stroke prevention by Plavix, but he is intolerant to all statins. If stroke, etiology could be AF having been off Eliquis x 7 days.    Impression: -Multiple stroke risk factors.  -numbness to LUE. -Ataxia to LUE.  -LUE weakness early am-stroke symptom vs pain/OA.   Recommendations/Plan:  -MRI brain with sedation. -Ativan 2mg  IV on call to MRI.  -MRA head and neck.  -Continue Eliquis and Plavix.  -echocardiogram. -HbA1c unless has had in last month. -Goal A1c < 7.  -LDL goal < 70, but treatment seems impossible with his intolerance to statins.  -neuro  checks. -Risk factor modification.  -Stroke education.  -Neurology will f/up MRI b and continue to follow.   Pt seen by Clance Boll, NP/Neuro and later by MD. Note/plan to be edited by MD as needed.  Pager: [phone]   As seen the patient reviewed the above note.  He was drowsy when I saw him, most likely due to having recently been given gabapentin and Percocet. he had transient left arm weakness/numbness/ataxia.  Given the distribution is not typical of any peripheral nerve and he has atrial fibrillation and recently been off of anticoagulation, I think that cerebral ischemia is likely the most likely etiology.  With a completely improved at this point, I think that TIA is a significant possibility.  I would favor MRI/MRA and other work-up as above.  With his symptoms back to normal, I do not think we need to hold Eliquis at this time.   Roland Rack, MD [facility] [phone]  If 7pm- 7am, please page neurology on call as listed in [place].

## 2020-11-23 NOTE — Progress Notes (Addendum)
Patient c/o numbness of the left thumb, index and middle finger, range of motion exercises of the fingers and arm performed.Aline discontinued as ordered, pressure dressing applied to site, no sign of bleeding noted will continue to monitor.

## 2020-11-23 NOTE — Progress Notes (Addendum)
Progress Note    11/23/2020 10:38 AM 1 Day Post-Op  Subjective:  Complaining of incisional pain. Denies CP or SOB.   Vitals:   11/23/20 0631 11/23/20 0800  BP: (!) 153/60 (!) 160/69  Pulse: 68 83  Resp: 11 18  Temp:  98.4 F (36.9 C)  SpO2: 92% 97%    Physical Exam: General appearance: Awake, alert in no apparent distress Cardiac: Heart rate and rhythm are regular. Tele: NSR Respirations: Nonlabored Incisions: Left groin, thigh and lower leg incisions are all well approximated without bleeding or hematoma. No significant edema Extremities: Both feet are warm with intact sensation and motor function.  Chronic ischemic changes noted and unchanged. Pulse/Doppler exam:  Improved left dorsalis pedis, posterior tibial and peroneal artery Doppler signals. + AT and PT signal on right  CBC    Component Value Date/Time   WBC 15.5 (H) 11/23/2020 0000   RBC 4.19 (L) 11/23/2020 0000   HGB 11.6 (L) 11/23/2020 0000   HGB 14.3 08/19/2020 0925   HCT 36.4 (L) 11/23/2020 0000   HCT 43.6 08/19/2020 0925   PLT 259 11/23/2020 0000   PLT 370 08/19/2020 0925   MCV 86.9 11/23/2020 0000   MCV 83 08/19/2020 0925   MCH 27.7 11/23/2020 0000   MCHC 31.9 11/23/2020 0000   RDW 14.0 11/23/2020 0000   RDW 14.2 08/19/2020 0925   LYMPHSABS 1.8 08/19/2020 0925   MONOABS 0.6 06/06/2020 0433   EOSABS 0.2 08/19/2020 0925   BASOSABS 0.1 08/19/2020 0925    BMET    Component Value Date/Time   NA 135 11/23/2020 0000   NA 140 08/19/2020 0925   K 4.4 11/23/2020 0000   CL 103 11/23/2020 0000   CO2 26 11/23/2020 0000   GLUCOSE 232 (H) 11/23/2020 0000   BUN 20 11/23/2020 0000   BUN 25 08/19/2020 0925   CREATININE 1.25 (H) 11/23/2020 0000   CALCIUM 8.8 (L) 11/23/2020 0000   GFRNONAA >60 11/23/2020 0000   GFRAA 54 (L) 06/20/2020 1529     Intake/Output Summary (Last 24 hours) at 11/23/2020 1038 Last data filed at 11/23/2020 8413 Gross per 24 hour  Intake 4327.34 ml  Output 2280 ml  Net 2047.34 ml     HOSPITAL MEDICATIONS Scheduled Meds:  apixaban  5 mg Oral BID   clopidogrel  75 mg Oral Daily   dapagliflozin propanediol  10 mg Oral QAC breakfast   docusate sodium  100 mg Oral Daily   DULoxetine  30 mg Oral Daily   gabapentin  900 mg Oral TID   insulin aspart  0-20 Units Subcutaneous TID WC   insulin aspart protamine- aspart  60 Units Subcutaneous BID WC   isosorbide mononitrate  30 mg Oral Daily   metoprolol succinate  50 mg Oral Daily   pantoprazole  40 mg Oral Daily   vitamin B-12  1,000 mcg Oral Daily   Continuous Infusions:  sodium chloride     sodium chloride 125 mL/hr at 11/22/20 2351   magnesium sulfate bolus IVPB     PRN Meds:.sodium chloride, acetaminophen **OR** acetaminophen, albuterol, alum & mag hydroxide-simeth, bisacodyl, guaiFENesin-dextromethorphan, hydrALAZINE, labetalol, magnesium sulfate bolus IVPB, metoprolol tartrate, morphine injection, ondansetron, oxyCODONE-acetaminophen, phenol, polyethylene glycol, potassium chloride  Assessment and Plan: POD 1 Left common femoral to above-knee popliteal artery bypass graft with reversed ipsilateral translocated saphenous vein; Right lower extremity angiogram; Stent, right superficial femoral artery. LE well perfused. Continue Plavix. Intolerant of antihyperlipidemics. Mobilize today.  History of atrial fibrillation on apixaban  Risa Grill, PA-C [facility] [phone] 11/23/2020  10:38 AM   VASCULAR STAFF ADDENDUM: I have independently interviewed and examined the patient. I agree with the above.   Yevonne Aline. Stanford Breed, MD [facility] Phone Number: [phone] 11/23/20 1038

## 2020-11-23 NOTE — Evaluation (Signed)
Physical Therapy Evaluation Patient Details Name: [name] [name] MRN: [id number] DOB: 18-Sep-1950 Today's Date: 11/23/2020   History of Present Illness  70 yo male s/p L common femoral above-knee popliteal artery bypass graft with reversed ipsilateral translocated saphenous vein, RLE angiogram, R superficial femoral artery stent on 7/9. PMH includes anxiety, OA, CAD, afib, depression, DM, GERD, HTN, HLD, MI with history of coronary stenting.  Clinical Impression   Pt presents with severe post-operative pain, difficulty performing mobility tasks, impaired strength, poor balance, and decreased activity tolerance. Pt to benefit from acute PT to address deficits. Pt ambulated short room distance, overall requiring min-mod assist and is very limited by post-op pain at this time. PT to progress mobility as tolerated, and will continue to follow acutely.   As of last night, pt with new onset LUE weakness, numbness, incoordination. PT notified RN and MD, PT recommending for further workup.     Follow Up Recommendations Home health PT;Supervision/Assistance - 24 hour    Equipment Recommendations  None recommended by PT    Recommendations for Other Services       Precautions / Restrictions Precautions Precautions: Fall Restrictions Weight Bearing Restrictions: No      Mobility  Bed Mobility Overal bed mobility: Needs Assistance Bed Mobility: Supine to Sit     Supine to sit: Mod assist     General bed mobility comments: Mod assist for trunk elevation, step-wise scooting to EOB, multiple stop/starts due to scrotal discomfort.    Transfers Overall transfer level: Needs assistance Equipment used: Rolling walker (2 wheeled) Transfers: Sit to/from Stand Sit to Stand: Min assist;From elevated surface         General transfer comment: min assist for initial power up, steadying upon standing. STS attempts x3 before successful stand, requires very elevated bed  height.  Ambulation/Gait Ambulation/Gait assistance: Min guard Gait Distance (Feet): 25 Feet Assistive device: Rolling walker (2 wheeled) Gait Pattern/deviations: Step-through pattern;Decreased stride length;Trunk flexed;Antalgic Gait velocity: decr   General Gait Details: min guard for safety, verbal cuing for upright posture, placement in RW, increasingly antalgic gait with further distance ambulated.  Stairs            Wheelchair Mobility    Modified Rankin (Stroke Patients Only)       Balance Overall balance assessment: Needs assistance Sitting-balance support: No upper extremity supported;Feet supported Sitting balance-Leahy Scale: Fair     Standing balance support: Bilateral upper extremity supported;During functional activity Standing balance-Leahy Scale: Poor Standing balance comment: reliant on external support                             Pertinent Vitals/Pain Pain Assessment: 0-10 Pain Score: 10-Worst pain ever Pain Location: L medial thigh Pain Descriptors / Indicators: Sore Pain Intervention(s): Limited activity within patient's tolerance;Monitored during session;Repositioned    Home Living Family/patient expects to be discharged to:: Private residence Living Arrangements: Spouse/significant other Available Help at Discharge: Family;Available 24 hours/day Type of Home: House Home Access: Stairs to enter Entrance Stairs-Rails: Right;Left;Can reach both Entrance Stairs-Number of Steps: 4 Home Layout: One level Home Equipment: Cane - single point;Walker - 4 wheels;Shower seat      Prior Function Level of Independence: Independent               Hand Dominance   Dominant Hand: Right    Extremity/Trunk Assessment   Upper Extremity Assessment Upper Extremity Assessment: LUE deficits/detail LUE Deficits / Details: Weak grip vs R,  3+/5 elbow flexion, at least 3/5 shoulder flex/ext. Dysmetria noted with finger-to-nose,  finger-to-thumb approximation LUE Sensation: decreased light touch;decreased proprioception    Lower Extremity Assessment Lower Extremity Assessment: Generalized weakness;LLE deficits/detail LLE: Unable to fully assess due to pain    Cervical / Trunk Assessment Cervical / Trunk Assessment: Normal  Communication   Communication: No difficulties  Cognition Arousal/Alertness: Awake/alert Behavior During Therapy: WFL for tasks assessed/performed Overall Cognitive Status: Within Functional Limits for tasks assessed                                        General Comments General comments (skin integrity, edema, etc.): New onset LUE weakness, numbness, incoordination concerning to this PT, RN and MD notified of PT recommendation for further workup. Min bleeding from L medial thigh incisions, RN called to room to address. Chronic-appearing wounds on bilat toes, shins. Blisters noted along pt posterior neck near skin folds, RN aware    Exercises     Assessment/Plan    PT Assessment Patient needs continued PT services  PT Problem List Decreased strength;Decreased mobility;Decreased safety awareness;Decreased activity tolerance;Decreased coordination;Decreased balance;Decreased knowledge of use of DME;Pain;Impaired sensation;Decreased skin integrity       PT Treatment Interventions DME instruction;Therapeutic activities;Gait training;Therapeutic exercise;Patient/family education;Balance training;Stair training;Functional mobility training;Neuromuscular re-education    PT Goals (Current goals can be found in the Care Plan section)  Acute Rehab PT Goals Patient Stated Goal: less pain PT Goal Formulation: With patient Time For Goal Achievement: 12/07/20 Potential to Achieve Goals: Good    Frequency Min 3X/week   Barriers to discharge        Co-evaluation               AM-PAC PT "6 Clicks" Mobility  Outcome Measure Help needed turning from your back to your  side while in a flat bed without using bedrails?: A Little Help needed moving from lying on your back to sitting on the side of a flat bed without using bedrails?: A Lot Help needed moving to and from a bed to a chair (including a wheelchair)?: A Lot Help needed standing up from a chair using your arms (e.g., wheelchair or bedside chair)?: A Little Help needed to walk in hospital room?: A Little Help needed climbing 3-5 steps with a railing? : A Lot 6 Click Score: 15    End of Session   Activity Tolerance: Patient limited by fatigue;Patient limited by pain Patient left: in chair;with call bell/phone within reach;with family/visitor present (Pt states he will press call button and wait for assist prior to mobilizing back to bed) Nurse Communication: Mobility status PT Visit Diagnosis: Other abnormalities of gait and mobility (R26.89);Pain Pain - Right/Left: Left Pain - part of body: Leg    Time: 1208-1250 PT Time Calculation (min) (ACUTE ONLY): 42 min   Charges:   PT Evaluation $PT Eval Low Complexity: 1 Low PT Treatments $Gait Training: 8-22 mins $Therapeutic Activity: 8-22 mins        Stacie Glaze, PT DPT Acute Rehabilitation Services Pager [phone]  Office [phone]   [place] 11/23/2020, 1:50 PM

## 2020-11-23 NOTE — Progress Notes (Signed)
Foley cath removed as ordered.

## 2020-11-23 NOTE — Evaluation (Signed)
Occupational Therapy Evaluation Patient Details Name: [name] [name] MRN: [id number] DOB: [birth year] Today's Date: 11/23/2020    History of Present Illness 70 yo male s/p L common femoral above-knee popliteal artery bypass graft with reversed ipsilateral translocated saphenous vein, RLE angiogram, R superficial femoral artery stent on 7/9. PMH includes anxiety, OA, CAD, afib, depression, DM, GERD, HTN, HLD, MI with history of coronary stenting.   Clinical Impression   Patient admitted for the diagnosis and procedure above.  PTA he lives with his spouse, who can provide assist at home.  He was pretty independent at home, occasional assist with lower body ADL, spouse assist with community mobility, meals and home management.  Barriers are listed below.  Currently he is needing up to [place] for basic mobility and up to Mod A for lower body ADL.  PT noted some numbness, weakness and incoordination to L arm compared to R arm.  Patient reports decreased sensation to L hand up to his elbow - self reports 50% sensation to the L compared to the R arm.  He is weaker with MMT to his L arm 3+/5 at the elbow and 4/5 to L shoulder.  Coordination is slowed to his L hand, but functional.  Patient believes IV team hit a nerve while placing the IV.  OT to monitor for any changes and will follow acutely.      Follow Up Recommendations  No OT follow up    Equipment Recommendations  None recommended by OT    Recommendations for Other Services       Precautions / Restrictions Precautions Precautions: Fall Restrictions Weight Bearing Restrictions: No      Mobility Bed Mobility Overal bed mobility: Needs Assistance Bed Mobility: Supine to Sit     Supine to sit: Mod assist     General bed mobility comments: up in recliner    Transfers Overall transfer level: Needs assistance Equipment used: Rolling walker (2 wheeled) Transfers: Sit to/from Stand Sit to Stand: Min assist;From elevated surface          General transfer comment: min assist for initial power up, steadying upon standing. STS attempts x3 before successful stand, requires very elevated bed height.    Balance Overall balance assessment: Needs assistance Sitting-balance support: Feet supported Sitting balance-Leahy Scale: Fair     Standing balance support: Bilateral upper extremity supported;During functional activity Standing balance-Leahy Scale: Poor Standing balance comment: reliant on external support                           ADL either performed or assessed with clinical judgement   ADL Overall ADL's : Needs assistance/impaired Eating/Feeding: Independent;Sitting   Grooming: Wash/dry hands;Wash/dry face;Supervision/safety;Standing       Lower Body Bathing: Moderate assistance;Sit to/from stand   Upper Body Dressing : Set up;Sitting   Lower Body Dressing: Moderate assistance;Sitting/lateral leans               Functional mobility during ADLs: Supervision/safety;Rolling walker       Vision Patient Visual Report: No change from baseline       Perception     Praxis      Pertinent Vitals/Pain Pain Assessment: 0-10 Pain Score: 8  Pain Location: L medial thigh Pain Descriptors / Indicators: Tightness;Sharp Pain Intervention(s): Monitored during session     Hand Dominance Right   Extremity/Trunk Assessment Upper Extremity Assessment Upper Extremity Assessment: LUE deficits/detail LUE Deficits / Details: Weak grip vs R, 3+/5  elbow flexion, at least 3/5 shoulder flex/ext. LUE Sensation: decreased light touch;decreased proprioception LUE Coordination: decreased fine motor   Lower Extremity Assessment Lower Extremity Assessment: Defer to PT evaluation LLE: Unable to fully assess due to pain   Cervical / Trunk Assessment Cervical / Trunk Assessment: Normal   Communication Communication Communication: No difficulties   Cognition Arousal/Alertness: Awake/alert Behavior  During Therapy: WFL for tasks assessed/performed Overall Cognitive Status: Within Functional Limits for tasks assessed                                                    Home Living Family/patient expects to be discharged to:: Private residence Living Arrangements: Spouse/significant other Available Help at Discharge: Family;Available 24 hours/day Type of Home: House Home Access: Stairs to enter [employer] of Steps: 4 Entrance Stairs-Rails: Right;Left;Can reach both Home Layout: One level     Bathroom Shower/Tub: Teacher, early years/pre: Standard     Home Equipment: Cane - single point;Walker - 4 wheels;Shower seat          Prior Functioning/Environment Level of Independence: Independent                 OT Problem List: Decreased strength;Impaired balance (sitting and/or standing);Pain      OT Treatment/Interventions: Self-care/ADL training;Therapeutic exercise;Balance training;Therapeutic activities    OT Goals(Current goals can be found in the care plan section) Acute Rehab OT Goals Patient Stated Goal: Play my guitar OT Goal Formulation: With patient Time For Goal Achievement: 12/07/20 Potential to Achieve Goals: Good ADL Goals Pt Will Perform Lower Body Bathing: sit to/from stand;with min guard assist Pt Will Perform Lower Body Dressing: with modified independence;with adaptive equipment;sit to/from stand Pt Will Transfer to Toilet: with modified independence;ambulating;regular height toilet  OT Frequency: Min 2X/week   Barriers to D/C:    none noted       Co-evaluation              AM-PAC OT "6 Clicks" Daily Activity     Outcome Measure Help from another person eating meals?: None Help from another person taking care of personal grooming?: None Help from another person toileting, which includes using toliet, bedpan, or urinal?: A Little Help from another person bathing (including washing, rinsing,  drying)?: A Lot Help from another person to put on and taking off regular upper body clothing?: None Help from another person to put on and taking off regular lower body clothing?: A Lot 6 Click Score: 19   End of Session Equipment Utilized During Treatment: Rolling walker  Activity Tolerance: Patient tolerated treatment well Patient left: in chair;with call bell/phone within reach;with family/visitor present  OT Visit Diagnosis: Unsteadiness on feet (R26.81);Pain Pain - Right/Left: Left Pain - part of body: Leg                Time: 0160-1093 OT Time Calculation (min): 17 min Charges:  OT General Charges $OT Visit: 1 Visit OT Evaluation $OT Eval Moderate Complexity: 1 Mod  11/23/2020  Rich, OTR/L  Acute Rehabilitation Services  Office:  [phone]   Metta Clines 11/23/2020, 2:07 PM

## 2020-11-24 ENCOUNTER — Inpatient Hospital Stay (HOSPITAL_COMMUNITY): Payer: Medicare Other

## 2020-11-24 ENCOUNTER — Other Ambulatory Visit (HOSPITAL_COMMUNITY): Payer: Medicare Other

## 2020-11-24 DIAGNOSIS — I6389 Other cerebral infarction: Secondary | ICD-10-CM

## 2020-11-24 DIAGNOSIS — I779 Disorder of arteries and arterioles, unspecified: Secondary | ICD-10-CM

## 2020-11-24 DIAGNOSIS — Z95828 Presence of other vascular implants and grafts: Secondary | ICD-10-CM

## 2020-11-24 DIAGNOSIS — I679 Cerebrovascular disease, unspecified: Secondary | ICD-10-CM

## 2020-11-24 DIAGNOSIS — I6523 Occlusion and stenosis of bilateral carotid arteries: Secondary | ICD-10-CM | POA: Diagnosis not present

## 2020-11-24 LAB — LIPID PANEL
Cholesterol: 173 mg/dL (ref 0–200)
HDL: 25 mg/dL — ABNORMAL LOW (ref >40)
LDL Cholesterol: 86 mg/dL (ref 0–99)
Total CHOL/HDL Ratio: 6.9 RATIO
Triglycerides: 308 mg/dL — ABNORMAL HIGH (ref <150)
VLDL: 62 mg/dL — ABNORMAL HIGH (ref 0–40)

## 2020-11-24 LAB — GLUCOSE, CAPILLARY
Glucose-Capillary: 231 mg/dL — ABNORMAL HIGH (ref 70–99)
Glucose-Capillary: 235 mg/dL — ABNORMAL HIGH (ref 70–99)
Glucose-Capillary: 199 mg/dL — ABNORMAL HIGH (ref 70–99)
Glucose-Capillary: 163 mg/dL — ABNORMAL HIGH (ref 70–99)

## 2020-11-24 LAB — HEMOGLOBIN A1C
Hgb A1c MFr Bld: 8.1 % — ABNORMAL HIGH (ref 4.8–5.6)
Mean Plasma Glucose: 185.77 mg/dL

## 2020-11-24 LAB — ECHOCARDIOGRAM COMPLETE
AR max vel: 2.85 cm2
AV Area VTI: 3.02 cm2
AV Area mean vel: 2.84 cm2
AV Mean grad: 7 mmHg
AV Peak grad: 13.7 mmHg
Ao pk vel: 1.85 m/s
Area-P 1/2: 3.91 cm2
Height: 74 in
S' Lateral: 3.3 cm
Weight: 4630.01 oz

## 2020-11-24 LAB — TSH: TSH: 2.281 u[IU]/mL (ref 0.350–4.500)

## 2020-11-24 MED ORDER — PERFLUTREN LIPID MICROSPHERE
1.0000 mL | INTRAVENOUS | Status: AC | PRN
  Administered 2020-11-24: 2 mL via INTRAVENOUS
  Filled 2020-11-24: qty 10

## 2020-11-24 MED ORDER — FENOFIBRATE 160 MG PO TABS
160.0000 mg | ORAL_TABLET | Freq: Every day | ORAL | Status: DC
  Administered 2020-11-24 – 2020-11-25 (×2): 160 mg via ORAL
  Filled 2020-11-24 (×2): qty 1

## 2020-11-24 NOTE — Progress Notes (Addendum)
Progress Note    11/24/2020 10:19 AM 2 Days Post-Op  Subjective:  He currently denies any LUE symptoms. Neurologu notes reviewed. Possible left TIA, no evidence of braina stroke. Nursing notes reviewed: brief episode of atril fib overnight without sustained elevated heart rate. He has been on apixiban post-operatively   Vitals:   11/24/20 0401 11/24/20 0748  BP: (!) 137/109 (!) 174/82  Pulse: 90 82  Resp: 20 14  Temp: 98.6 F (37 C) 98.9 F (37.2 C)  SpO2: 94% 97%    Physical Exam: General appearance: Awake, alert in no apparent distress Cardiac: Heart rate and rhythm are regular Respirations: Nonlabored Incisions: Left groin, thigh and lower leg incisions are all well approximated without bleeding or hematoma. No significant edema. Extremities: Both feet are warm with intact sensation and motor function.  Ischemic changes noted and unchanged. Pulse/Doppler exam: Improved left dorsalis pedis, posterior tibial and peroneal artery Doppler signals. + AT and PT signal on right  CBC    Component Value Date/Time   WBC 15.5 (H) 11/23/2020 0000   RBC 4.19 (L) 11/23/2020 0000   HGB 11.6 (L) 11/23/2020 0000   HGB 14.3 08/19/2020 0925   HCT 36.4 (L) 11/23/2020 0000   HCT 43.6 08/19/2020 0925   PLT 259 11/23/2020 0000   PLT 370 08/19/2020 0925   MCV 86.9 11/23/2020 0000   MCV 83 08/19/2020 0925   MCH 27.7 11/23/2020 0000   MCHC 31.9 11/23/2020 0000   RDW 14.0 11/23/2020 0000   RDW 14.2 08/19/2020 0925   LYMPHSABS 1.8 08/19/2020 0925   MONOABS 0.6 06/06/2020 0433   EOSABS 0.2 08/19/2020 0925   BASOSABS 0.1 08/19/2020 0925    BMET    Component Value Date/Time   NA 135 11/23/2020 0000   NA 140 08/19/2020 0925   K 4.4 11/23/2020 0000   CL 103 11/23/2020 0000   CO2 26 11/23/2020 0000   GLUCOSE 232 (H) 11/23/2020 0000   BUN 20 11/23/2020 0000   BUN 25 08/19/2020 0925   CREATININE 1.25 (H) 11/23/2020 0000   CALCIUM 8.8 (L) 11/23/2020 0000   GFRNONAA >60 11/23/2020  0000   GFRAA 54 (L) 06/20/2020 1529     Intake/Output Summary (Last 24 hours) at 11/24/2020 1019 Last data filed at 11/24/2020 0200 Gross per 24 hour  Intake --  Output 1350 ml  Net -1350 ml    HOSPITAL MEDICATIONS Scheduled Meds:  apixaban  5 mg Oral BID   clopidogrel  75 mg Oral Daily   dapagliflozin propanediol  10 mg Oral QAC breakfast   docusate sodium  100 mg Oral Daily   DULoxetine  30 mg Oral Daily   fenofibrate  160 mg Oral Daily   gabapentin  900 mg Oral TID   insulin aspart  0-20 Units Subcutaneous TID WC   insulin aspart protamine- aspart  60 Units Subcutaneous BID WC   isosorbide mononitrate  30 mg Oral Daily   LORazepam  2 mg Intravenous Once   metoprolol succinate  50 mg Oral Daily   pantoprazole  40 mg Oral Daily   vitamin B-12  1,000 mcg Oral Daily   Continuous Infusions:  sodium chloride     sodium chloride 125 mL/hr at 11/22/20 2351   magnesium sulfate bolus IVPB     PRN Meds:.sodium chloride, acetaminophen **OR** acetaminophen, albuterol, alum & mag hydroxide-simeth, bisacodyl, guaiFENesin-dextromethorphan, hydrALAZINE, labetalol, magnesium sulfate bolus IVPB, metoprolol tartrate, morphine injection, ondansetron, oxyCODONE-acetaminophen, phenol, polyethylene glycol, potassium chloride  Assessment and Plan: POD 2  Left common femoral to above-knee popliteal artery bypass graft with reversed ipsilateral translocated saphenous vein; Right lower extremity angiogram; Stent, right superficial femoral artery. LE well perfused. Continue Plavix. Intolerant of antihyperlipidemics. Continue/encouraged mobilization.   History of atrial fibrillation on apixaban. Possible TIA. Neuro at baseline. Appreciate neurology eval/work-up.  Risa Grill, PA-C [facility] [phone] 11/24/2020  10:19 AM   VASCULAR STAFF ADDENDUM: I have independently interviewed and examined the patient. I agree with the above.   Yevonne Aline. Stanford Breed, MD [facility] Phone Number: [phone] 11/24/2020 10:34 AM

## 2020-11-24 NOTE — Progress Notes (Signed)
Mobility Specialist: Progress Note   11/24/20 1627  Mobility  Activity Ambulated in hall  Level of Assistance Contact guard assist, steadying assist  Assistive Device Front wheel walker  Distance Ambulated (ft) 130 ft  Mobility Ambulated with assistance in hallway  Mobility Response Tolerated well  Mobility performed by Mobility specialist  $Mobility charge 1 Mobility   Pre-Mobility: 82 HR During Mobility: 113 HR Post-Mobility: 107 HR, 93% SpO2  Pt required one standing break halfway through ambulation lasting <30 seconds d/t fatigue and SOB. Pt back to bed after walk per request. Pt attempting to sit before completely standing against the bed and required verbal cues for safety. Pt's wife is present in the room.  [facility] [name] [name] Mobility Specialist Mobility Specialist Phone: [phone]

## 2020-11-24 NOTE — Progress Notes (Addendum)
STROKE TEAM PROGRESS NOTE   ATTENDING NOTE: I reviewed above note and agree with the assessment and plan. Pt was seen and examined.   70 year old male with history of diabetes, hypertension, hyperlipidemia on fenofibrate (intolerance to statin and Repatha), PVD, obesity, A. fib on Eliquis, CAD/MI status post stent on Plavix, obesity, OSA, history of left BG infarct, CHF admitted for left femoropopliteal bypass and stenting 11/22/2020.  Eliquis was on hold for a week prior to procedure and restarted after procedure.  However, patient woke up yesterday morning from procedure, feeling numbness of left thumb, in the finger and middle finger as well as distal left forearm around site of A-line access.  After A-line removed, symptoms gradually resolved.  He denies left arm weakness to me, however as per chart, he was found to have left arm ataxia but all resolved.  MRI negative for stroke.  MRA head and neck showed severe distal left ICA prior to skull base severe near occlusive stenosis with decreased flow on the intracranial left ICA and MCA, chronic left VA occlusion severe stenosis bilateral cavernous ICAs, right ICA about 40 to 50% stenosis.  EF 60 to 65%.  A1c 8.1, LDL 86, WBC 15.5.  On exam, no focal neurologic deficit.  Awake alert, orientated x3, no facial droop, moving all extremities except left lower extremity not able to lift up or move the knee due to post procedure/surgery.  However distal left lower extremity equal strength compared to the right.  Sensation symmetrical, finger-to-nose intact.  Etiology for patient's symptoms not quite clear, per patient, the left finger numbness and distal forearm numbness due to A line access, now resolved after a line removal.  However PT report left arm ataxia but now resolved.  MRI negative.  Patient does have risk factor for stroke, therefore TIA cannot be completed without.  However, the current MRA head and neck concerning for new left ICA high-grade stenosis  with downstream MCA decreased flow.  His creatinine improved to 1.25 yesterday, I recommend CTA head and neck for further evaluation of cerebral vasculature.  However, patient refused further cerebral imaging.  I also try to see better we can repeat carotid Doppler to compare to 1 year ago, he again refused.  Recommend to follow-up as outpatient with Dr. [name] and Dr. Jaynee Eagles.  Patient currently on Eliquis and Plavix which is maximized medical regimen for stroke/CAD/PVD.  Recommend to continue.  Also continue fenofibrate since he has intolerance to statin and PCSK9 inhibitors.  He will continue follow-up with Dr. Lavell Anchors at [facility] in 4 weeks.  For detailed assessment and plan, please refer to above as I have made changes wherever appropriate.   Neurology will sign off. Please call with questions. Pt will follow up with Dr. Jaynee Eagles at [facility] in about 4 weeks. Thanks for the consult.   Rosalin Hawking, MD PhD Stroke Neurology 11/24/2020 5:22 PM    INTERVAL HISTORY His wife is at the bedside.  Obese and pleasant white male found lying in bed, echoardiogram in progress.   Vitals:   11/24/20 0216 11/24/20 0401 11/24/20 0748 11/24/20 1221  BP: 115/69 (!) 137/109 (!) 174/82 (!) 167/74  Pulse: 96 90 82 85  Resp: 20 20 14 19   Temp: 99 F (37.2 C) 98.6 F (37 C) 98.9 F (37.2 C) 98.7 F (37.1 C)  TempSrc: Oral Oral Oral Oral  SpO2: 93% 94% 97% 95%  Weight:      Height:       CBC:  Recent Labs  Lab 11/20/20 0936 11/23/20 0000  WBC 9.7 15.5*  HGB 15.1 11.6*  HCT 48.7 36.4*  MCV 87.6 86.9  PLT 414* 323   Basic Metabolic Panel:  Recent Labs  Lab 11/20/20 0936 11/23/20 0000  NA 135 135  K 4.0 4.4  CL 97* 103  CO2 27 26  GLUCOSE 152* 232*  BUN 25* 20  CREATININE 1.47* 1.25*  CALCIUM 10.1 8.8*    Lipid Panel:  Recent Labs  Lab 11/24/20 0211  CHOL 173  TRIG 308*  HDL 25*  CHOLHDL 6.9  VLDL 62*  LDLCALC 86    HgbA1c:  Recent Labs  Lab 11/24/20 0211  HGBA1C 8.1*   Urine Drug  Screen: No results for input(s): LABOPIA, COCAINSCRNUR, LABBENZ, AMPHETMU, THCU, LABBARB in the last 168 hours.  Alcohol Level No results for input(s): ETH in the last 168 hours.  IMAGING past 24 hours MR ANGIO HEAD WO CONTRAST  Result Date: 11/24/2020 CLINICAL DATA:  Follow-up examination for acute stroke. Left upper extremity numbness, ataxia. EXAM: MRI HEAD WITHOUT CONTRAST MRA HEAD WITHOUT CONTRAST MRA NECK WITHOUT CONTRAST TECHNIQUE: Multiplanar, multiecho pulse sequences of the brain and surrounding structures were obtained without intravenous contrast. Angiographic images of the Circle of Willis were obtained using MRA technique without intravenous contrast. Angiographic images of the neck were obtained using MRA technique without intravenous contrast. Carotid stenosis measurements (when applicable) are obtained utilizing NASCET criteria, using the distal internal carotid diameter as the denominator. COMPARISON:  Prior CT from 01/05/2020 and MRI from 05/23/2016 FINDINGS: MRI HEAD FINDINGS Brain: Cerebral volume within normal limits for age. Few scattered patchy foci of T2/FLAIR hyperintensity noted involving the periventricular deep white matter both cerebral hemispheres, nonspecific, but fairly minimal in nature in felt to be within normal limits for age. No abnormal foci of restricted diffusion to suggest acute or subacute ischemia. Gray-white matter differentiation maintained. No encephalomalacia to suggest chronic cortical infarction. No evidence for acute or chronic intracranial hemorrhage. No mass lesion, mass effect or midline shift. No hydrocephalus or extra-axial fluid collection. Pituitary gland and suprasellar region normal. Midline structures intact. Vascular: Abnormal flow void within the proximal left V4 segment, likely related to occlusion. Irregular flow void at the left ICA at the skull base suspected to be related to slow flow due to a proximal stenosis (series 6, image 5). Major  intracranial vascular flow voids are otherwise maintained. Skull and upper cervical spine: Craniocervical junction within normal limits. Bone marrow signal intensity within normal limits. No scalp soft tissue abnormality. Sinuses/Orbits: Globes and orbital soft tissues within normal limits. Mild scattered mucosal thickening noted within the ethmoidal air cells. Paranasal sinuses are otherwise clear. No mastoid effusion. Inner ear structures grossly normal. Other: None. MRA HEAD FINDINGS ANTERIOR CIRCULATION: Widely patent antegrade flow seen within the petrous right ICA. There is a focal severe stenosis involving the proximal cavernous right ICA (series 3, image 73), new from prior. Right ICA otherwise patent distally to the terminus. On the left, the left ICA is attenuated and small at the skull base related to the proximal stenosis. Petrous left ICA widely patent. There is a probable focal severe stenosis at the anterior genu of the cavernous left ICA (series 3, image 79). Left ICA markedly attenuated but otherwise patent distally to the terminus. A1 segments patent. Normal anterior communicating artery complex. Anterior cerebral arteries patent to their distal aspects without stenosis. Right M1 widely patent. Normal right MCA bifurcation. Distal right MCA branches well perfused. Markedly attenuated but patent flow  within the left M1 segment negative left MCA bifurcation. Distal left MCA branches markedly attenuated but grossly perfused. Previously noted proximal M2 stenoses not well visualized on this exam. POSTERIOR CIRCULATION: Dominant right V4 segment widely patent without stenosis, better seen on corresponding MRA neck portion of this exam. Right PICA patent and perfused. Left vertebral artery occluded at the skull base. Distal reconstitution of the distal left V4 segment across the vertebrobasilar junction with perfusion of the left PICA. Appearance is stable from previous. Basilar remains widely patent.  Superior cerebellar arteries patent bilaterally. Left PCA primarily supplied via the basilar. Right PCA supplied via the basilar as well as a prominent right posterior communicating artery. Short-segment moderate stenosis at the right P1/P2 junction (series 353, image 11). PCAs otherwise widely patent to their distal aspects. No intracranial aneurysm. MRA NECK FINDINGS AORTIC ARCH: Visualized aortic arch normal in caliber with normal branch pattern. No hemodynamically significant stenosis seen about the origin of the great vessels. RIGHT CAROTID SYSTEM: Right CCA patent to the bifurcation without stenosis. Atheromatous irregularity of up to 40-50% seen at the origin of the right ICA (series 1250, image 6). Right ICA patent distally without stenosis, evidence for dissection, or occlusion. LEFT CAROTID SYSTEM: Left CCA patent from its origin to the bifurcation without stenosis. Atheromatous irregularity at the left carotid bulb/proximal left ICA without hemodynamically significant stenosis. Left ICA somewhat diminutive and attenuated but patent distally. There is an apparent severe high-grade stenosis at the distal left ICA just prior to the skull base (series 12, image 13). VERTEBRAL ARTERIES: Both vertebral arteries arise from the subclavian arteries. Strongly dominant right vertebral artery with diffusely hypoplastic left vertebral artery. Neither vertebral artery origin well assessed on this noncontrast examination. There is question of severe stenosis versus artifact at the origin of the dominant right vertebral artery (series 12, image 132). Right vertebral artery otherwise widely patent distally within the neck. Again seen is thready, irregular, and attenuated flow throughout the hypoplastic left vertebral artery, which occludes at the V3 segment. Appearance is similar to previous. IMPRESSION: MRI HEAD: Negative brain MRI for age. No acute intracranial infarct or other abnormality. MRA HEAD: 1. 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 Distal reconstitution across the vertebrobasilar junction with perfusion of the left PICA, stable from prior. 2. Markedly attenuated and diminished flow throughout the intracranial left ICA and MCA distribution due to a proximal severe stenosis in the neck (see below). 3. Severe stenoses involving the cavernous segments of both internal carotid arteries as above, new/progressed from previous. 4. Moderate stenosis at the right P1/P2 junction. MRA NECK: 1. Apparent severe near occlusive stenosis involving the distal left ICA just prior to the skull base, new from previous. 2. Atheromatous change about the carotid bifurcations with associated 40-50% on the right. 3. Thready irregular flow throughout the hypoplastic left vertebral artery with occlusion at the V3 segment, stable. 4. Question severe stenosis versus artifact at the origin of the right vertebral artery. Dominant right vertebral artery otherwise patent within the neck. Correlation with dedicated CTA could be performed for further evaluation as warranted. Electronically Signed   By: Jeannine Boga M.D.   On: 11/24/2020 04:50   MR ANGIO NECK WO CONTRAST  Result Date: 11/24/2020 CLINICAL DATA:  Follow-up examination for acute stroke. Left upper extremity numbness, ataxia. EXAM: MRI HEAD WITHOUT CONTRAST MRA HEAD WITHOUT CONTRAST MRA NECK WITHOUT CONTRAST TECHNIQUE: Multiplanar, multiecho pulse sequences of the brain and surrounding structures were obtained without intravenous  contrast. Angiographic images of the Circle of Willis were obtained using MRA technique without intravenous contrast. Angiographic images of the neck were obtained using MRA technique without intravenous contrast. Carotid stenosis measurements (when applicable) are obtained utilizing NASCET criteria, using the distal internal carotid diameter as the denominator. COMPARISON:  Prior CT from 01/05/2020 and MRI from 05/23/2016  FINDINGS: MRI HEAD FINDINGS Brain: Cerebral volume within normal limits for age. Few scattered patchy foci of T2/FLAIR hyperintensity noted involving the periventricular deep white matter both cerebral hemispheres, nonspecific, but fairly minimal in nature in felt to be within normal limits for age. No abnormal foci of restricted diffusion to suggest acute or subacute ischemia. Gray-white matter differentiation maintained. No encephalomalacia to suggest chronic cortical infarction. No evidence for acute or chronic intracranial hemorrhage. No mass lesion, mass effect or midline shift. No hydrocephalus or extra-axial fluid collection. Pituitary gland and suprasellar region normal. Midline structures intact. Vascular: Abnormal flow void within the proximal left V4 segment, likely related to occlusion. Irregular flow void at the left ICA at the skull base suspected to be related to slow flow due to a proximal stenosis (series 6, image 5). Major intracranial vascular flow voids are otherwise maintained. Skull and upper cervical spine: Craniocervical junction within normal limits. Bone marrow signal intensity within normal limits. No scalp soft tissue abnormality. Sinuses/Orbits: Globes and orbital soft tissues within normal limits. Mild scattered mucosal thickening noted within the ethmoidal air cells. Paranasal sinuses are otherwise clear. No mastoid effusion. Inner ear structures grossly normal. Other: None. MRA HEAD FINDINGS ANTERIOR CIRCULATION: Widely patent antegrade flow seen within the petrous right ICA. There is a focal severe stenosis involving the proximal cavernous right ICA (series 3, image 73), new from prior. Right ICA otherwise patent distally to the terminus. On the left, the left ICA is attenuated and small at the skull base related to the proximal stenosis. Petrous left ICA widely patent. There is a probable focal severe stenosis at the anterior genu of the cavernous left ICA (series 3, image 79). Left  ICA markedly attenuated but otherwise patent distally to the terminus. A1 segments patent. Normal anterior communicating artery complex. Anterior cerebral arteries patent to their distal aspects without stenosis. Right M1 widely patent. Normal right MCA bifurcation. Distal right MCA branches well perfused. Markedly attenuated but patent flow within the left M1 segment negative left MCA bifurcation. Distal left MCA branches markedly attenuated but grossly perfused. Previously noted proximal M2 stenoses not well visualized on this exam. POSTERIOR CIRCULATION: Dominant right V4 segment widely patent without stenosis, better seen on corresponding MRA neck portion of this exam. Right PICA patent and perfused. Left vertebral artery occluded at the skull base. Distal reconstitution of the distal left V4 segment across the vertebrobasilar junction with perfusion of the left PICA. Appearance is stable from previous. Basilar remains widely patent. Superior cerebellar arteries patent bilaterally. Left PCA primarily supplied via the basilar. Right PCA supplied via the basilar as well as a prominent right posterior communicating artery. Short-segment moderate stenosis at the right P1/P2 junction (series 353, image 11). PCAs otherwise widely patent to their distal aspects. No intracranial aneurysm. MRA NECK FINDINGS AORTIC ARCH: Visualized aortic arch normal in caliber with normal branch pattern. No hemodynamically significant stenosis seen about the origin of the great vessels. RIGHT CAROTID SYSTEM: Right CCA patent to the bifurcation without stenosis. Atheromatous irregularity of up to 40-50% seen at the origin of the right ICA (series 1250, image 6). Right ICA patent distally without stenosis, evidence for  dissection, or occlusion. LEFT CAROTID SYSTEM: Left CCA patent from its origin to the bifurcation without stenosis. Atheromatous irregularity at the left carotid bulb/proximal left ICA without hemodynamically significant  stenosis. Left ICA somewhat diminutive and attenuated but patent distally. There is an apparent severe high-grade stenosis at the distal left ICA just prior to the skull base (series 12, image 13). VERTEBRAL ARTERIES: Both vertebral arteries arise from the subclavian arteries. Strongly dominant right vertebral artery with diffusely hypoplastic left vertebral artery. Neither vertebral artery origin well assessed on this noncontrast examination. There is question of severe stenosis versus artifact at the origin of the dominant right vertebral artery (series 12, image 132). Right vertebral artery otherwise widely patent distally within the neck. Again seen is thready, irregular, and attenuated flow throughout the hypoplastic left vertebral artery, which occludes at the V3 segment. Appearance is similar to previous. IMPRESSION: MRI HEAD: Negative brain MRI for age. No acute intracranial infarct or other abnormality. MRA HEAD: 1. 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 Distal reconstitution across the vertebrobasilar junction with perfusion of the left PICA, stable from prior. 2. Markedly attenuated and diminished flow throughout the intracranial left ICA and MCA distribution due to a proximal severe stenosis in the neck (see below). 3. Severe stenoses involving the cavernous segments of both internal carotid arteries as above, new/progressed from previous. 4. Moderate stenosis at the right P1/P2 junction. MRA NECK: 1. Apparent severe near occlusive stenosis involving the distal left ICA just prior to the skull base, new from previous. 2. Atheromatous change about the carotid bifurcations with associated 40-50% on the right. 3. Thready irregular flow throughout the hypoplastic left vertebral artery with occlusion at the V3 segment, stable. 4. Question severe stenosis versus artifact at the origin of the right vertebral artery. Dominant right vertebral artery otherwise patent within the neck.  Correlation with dedicated CTA could be performed for further evaluation as warranted. Electronically Signed   By: Jeannine Boga M.D.   On: 11/24/2020 04:50   MR BRAIN WO CONTRAST  Result Date: 11/24/2020 CLINICAL DATA:  Follow-up examination for acute stroke. Left upper extremity numbness, ataxia. EXAM: MRI HEAD WITHOUT CONTRAST MRA HEAD WITHOUT CONTRAST MRA NECK WITHOUT CONTRAST TECHNIQUE: Multiplanar, multiecho pulse sequences of the brain and surrounding structures were obtained without intravenous contrast. Angiographic images of the Circle of Willis were obtained using MRA technique without intravenous contrast. Angiographic images of the neck were obtained using MRA technique without intravenous contrast. Carotid stenosis measurements (when applicable) are obtained utilizing NASCET criteria, using the distal internal carotid diameter as the denominator. COMPARISON:  Prior CT from 01/05/2020 and MRI from 05/23/2016 FINDINGS: MRI HEAD FINDINGS Brain: Cerebral volume within normal limits for age. Few scattered patchy foci of T2/FLAIR hyperintensity noted involving the periventricular deep white matter both cerebral hemispheres, nonspecific, but fairly minimal in nature in felt to be within normal limits for age. No abnormal foci of restricted diffusion to suggest acute or subacute ischemia. Gray-white matter differentiation maintained. No encephalomalacia to suggest chronic cortical infarction. No evidence for acute or chronic intracranial hemorrhage. No mass lesion, mass effect or midline shift. No hydrocephalus or extra-axial fluid collection. Pituitary gland and suprasellar region normal. Midline structures intact. Vascular: Abnormal flow void within the proximal left V4 segment, likely related to occlusion. Irregular flow void at the left ICA at the skull base suspected to be related to slow flow due to a proximal stenosis (series 6, image 5). Major intracranial vascular flow voids are otherwise  maintained. Skull and upper cervical spine: Craniocervical junction within normal limits. Bone marrow signal intensity within normal limits. No scalp soft tissue abnormality. Sinuses/Orbits: Globes and orbital soft tissues within normal limits. Mild scattered mucosal thickening noted within the ethmoidal air cells. Paranasal sinuses are otherwise clear. No mastoid effusion. Inner ear structures grossly normal. Other: None. MRA HEAD FINDINGS ANTERIOR CIRCULATION: Widely patent antegrade flow seen within the petrous right ICA. There is a focal severe stenosis involving the proximal cavernous right ICA (series 3, image 73), new from prior. Right ICA otherwise patent distally to the terminus. On the left, the left ICA is attenuated and small at the skull base related to the proximal stenosis. Petrous left ICA widely patent. There is a probable focal severe stenosis at the anterior genu of the cavernous left ICA (series 3, image 79). Left ICA markedly attenuated but otherwise patent distally to the terminus. A1 segments patent. Normal anterior communicating artery complex. Anterior cerebral arteries patent to their distal aspects without stenosis. Right M1 widely patent. Normal right MCA bifurcation. Distal right MCA branches well perfused. Markedly attenuated but patent flow within the left M1 segment negative left MCA bifurcation. Distal left MCA branches markedly attenuated but grossly perfused. Previously noted proximal M2 stenoses not well visualized on this exam. POSTERIOR CIRCULATION: Dominant right V4 segment widely patent without stenosis, better seen on corresponding MRA neck portion of this exam. Right PICA patent and perfused. Left vertebral artery occluded at the skull base. Distal reconstitution of the distal left V4 segment across the vertebrobasilar junction with perfusion of the left PICA. Appearance is stable from previous. Basilar remains widely patent. Superior cerebellar arteries patent bilaterally.  Left PCA primarily supplied via the basilar. Right PCA supplied via the basilar as well as a prominent right posterior communicating artery. Short-segment moderate stenosis at the right P1/P2 junction (series 353, image 11). PCAs otherwise widely patent to their distal aspects. No intracranial aneurysm. MRA NECK FINDINGS AORTIC ARCH: Visualized aortic arch normal in caliber with normal branch pattern. No hemodynamically significant stenosis seen about the origin of the great vessels. RIGHT CAROTID SYSTEM: Right CCA patent to the bifurcation without stenosis. Atheromatous irregularity of up to 40-50% seen at the origin of the right ICA (series 1250, image 6). Right ICA patent distally without stenosis, evidence for dissection, or occlusion. LEFT CAROTID SYSTEM: Left CCA patent from its origin to the bifurcation without stenosis. Atheromatous irregularity at the left carotid bulb/proximal left ICA without hemodynamically significant stenosis. Left ICA somewhat diminutive and attenuated but patent distally. There is an apparent severe high-grade stenosis at the distal left ICA just prior to the skull base (series 12, image 13). VERTEBRAL ARTERIES: Both vertebral arteries arise from the subclavian arteries. Strongly dominant right vertebral artery with diffusely hypoplastic left vertebral artery. Neither vertebral artery origin well assessed on this noncontrast examination. There is question of severe stenosis versus artifact at the origin of the dominant right vertebral artery (series 12, image 132). Right vertebral artery otherwise widely patent distally within the neck. Again seen is thready, irregular, and attenuated flow throughout the hypoplastic left vertebral artery, which occludes at the V3 segment. Appearance is similar to previous. IMPRESSION: MRI HEAD: Negative brain MRI for age. No acute intracranial infarct or other abnormality. MRA HEAD: 1. 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  Distal reconstitution across the vertebrobasilar junction with perfusion of the left PICA, stable from prior. 2. Markedly attenuated and diminished flow throughout the intracranial left ICA and  MCA distribution due to a proximal severe stenosis in the neck (see below). 3. Severe stenoses involving the cavernous segments of both internal carotid arteries as above, new/progressed from previous. 4. Moderate stenosis at the right P1/P2 junction. MRA NECK: 1. Apparent severe near occlusive stenosis involving the distal left ICA just prior to the skull base, new from previous. 2. Atheromatous change about the carotid bifurcations with associated 40-50% on the right. 3. Thready irregular flow throughout the hypoplastic left vertebral artery with occlusion at the V3 segment, stable. 4. Question severe stenosis versus artifact at the origin of the right vertebral artery. Dominant right vertebral artery otherwise patent within the neck. Correlation with dedicated CTA could be performed for further evaluation as warranted. Electronically Signed   By: Jeannine Boga M.D.   On: 11/24/2020 04:50   ECHOCARDIOGRAM COMPLETE  Result Date: 11/24/2020    ECHOCARDIOGRAM REPORT   Patient Name:   [name] [name] Date of Exam: 11/24/2020 Medical Rec #:  [id number]    Height:       74.0 in Accession #:    [id number]   Weight:       289.4 lb Date of Birth:  December 06, 1950    BSA:          2.543 m Patient Age:    57 years     BP:           174/82 mmHg Patient Gender: M            HR:           85 bpm. Exam Location:  Inpatient Procedure: 2D Echo, Cardiac Doppler, Color Doppler and Intracardiac            Opacification Agent Indications:    Stroke I63.9  History:        Patient has prior history of Echocardiogram examinations, most                 recent 11/01/2019. PAD; Risk Factors:Morbid obesity.  Sonographer:    Merrie Roof RDCS Referring Phys: [place]  1. Left ventricular ejection fraction, by estimation, is  60 to 65%. The left ventricle has normal function. The left ventricle has no regional wall motion abnormalities. There is mild left ventricular hypertrophy. Left ventricular diastolic parameters were normal.  2. Right ventricular systolic function is normal. The right ventricular size is normal.  3. The mitral valve is normal in structure. Trivial mitral valve regurgitation.  4. The aortic valve is grossly normal. Aortic valve regurgitation is trivial. No aortic stenosis is present. FINDINGS  Left Ventricle: Left ventricular ejection fraction, by estimation, is 60 to 65%. The left ventricle has normal function. The left ventricle has no regional wall motion abnormalities. Definity contrast agent was given IV to delineate the left ventricular  endocardial borders. The left ventricular internal cavity size was normal in size. There is mild left ventricular hypertrophy. Left ventricular diastolic parameters were normal. Right Ventricle: The right ventricular size is normal. No increase in right ventricular wall thickness. Right ventricular systolic function is normal. Left Atrium: Left atrial size was normal in size. Right Atrium: Right atrial size was normal in size. Pericardium: There is no evidence of pericardial effusion. Mitral Valve: The mitral valve is normal in structure. Trivial mitral valve regurgitation. Tricuspid Valve: The tricuspid valve is grossly normal. Tricuspid valve regurgitation is trivial. Aortic Valve: The aortic valve is grossly normal. Aortic valve regurgitation is trivial. No aortic stenosis is present. Aortic valve mean  gradient measures 7.0 mmHg. Aortic valve peak gradient measures 13.7 mmHg. Aortic valve area, by VTI measures 3.02 cm. Pulmonic Valve: The pulmonic valve was grossly normal. Pulmonic valve regurgitation is not visualized. Aorta: The aortic root and ascending aorta are structurally normal, with no evidence of dilitation. IAS/Shunts: The atrial septum is grossly normal.  LEFT  VENTRICLE PLAX 2D LVIDd:         5.20 cm  Diastology LVIDs:         3.30 cm  LV e' medial:    4.57 cm/s LV PW:         1.30 cm  LV E/e' medial:  14.7 LV IVS:        1.20 cm  LV e' lateral:   9.46 cm/s LVOT diam:     2.30 cm  LV E/e' lateral: 7.1 LV SV:         99 LV SV Index:   39 LVOT Area:     4.15 cm  RIGHT VENTRICLE          IVC RV Basal diam:  4.80 cm  IVC diam: 2.30 cm RV Mid diam:    4.40 cm LEFT ATRIUM           Index       RIGHT ATRIUM           Index LA diam:      3.70 cm 1.46 cm/m  RA Area:     26.60 cm LA Vol (A4C): 77.8 ml 30.59 ml/m RA Volume:   76.60 ml  30.12 ml/m  AORTIC VALVE AV Area (Vmax):    2.85 cm AV Area (Vmean):   2.84 cm AV Area (VTI):     3.02 cm AV Vmax:           185.00 cm/s AV Vmean:          121.000 cm/s AV VTI:            0.327 m AV Peak Grad:      13.7 mmHg AV Mean Grad:      7.0 mmHg LVOT Vmax:         127.00 cm/s LVOT Vmean:        82.800 cm/s LVOT VTI:          0.238 m LVOT/AV VTI ratio: 0.73  AORTA Ao Root diam: 3.20 cm Ao Asc diam:  3.30 cm MITRAL VALVE MV Area (PHT): 3.91 cm    SHUNTS MV Decel Time: 194 msec    Systemic VTI:  0.24 m MV E velocity: 67.00 cm/s  Systemic Diam: 2.30 cm MV A velocity: 86.70 cm/s MV E/A ratio:  0.77 Mertie Moores MD Electronically signed by Mertie Moores MD Signature Date/Time: 11/24/2020/12:36:14 PM    Final     PHYSICAL EXAM  NEURO: Mental Status: Alert and oriented x 3.  Speech/Language: speech is without dysarthria or aphasia. Naming, repetition, fluency, and comprehension intact.   Cranial Nerves: II: PERRL  Visual fields full. III, IV, VI: EOMI. Lid elevation symmetric and full. V: sensation is intact and symmetrical to face. VII: Smile is symmetrical. Able to puff cheeks and raise eyebrows. VIII:hearing intact to voice. IX, X: palate elevation is symmetric. Phonation normal. XI: normal sternocleidomastoid and trapezius muscle strength. MVE:HMCNOB is symmetrical without fasciculations.    Motor: RUE/LUE 5/5 grip,  triceps and biceps. RLE: 5/5. Tone is normal. Bulk is increased.   Sensation- Intact to light touch bilaterally in all four extremities. Extinction absent to light touch to  DSS.  Coordination: FTN intact on right. Ataxic on left. No pronator drift.  Pain from OA in LUE shoulder interferes with parts of exam.   DTRs:  1+ UEs.  Gait- deferred    ASSESSMENT/PLAN [name] [name] is a 70 y.o. male with history of   with complications of neuropathy and retinopathy, morbid obesity, left basal gangia lacunar infarct, AF, Vitamin B12 deficiency, syncope, depression, OSA with non compliance with CPAP, dCHF, GERD, bilateral LE ulcers s/p percutaneous revascularization with stenting of his right superficial femoral artery, CAD s/p MI and DES in 2010 and 2018, HLD with intolerance to statins and Repatha. Due to new leg ulcer, patient underwent a left femoral above knee popliteal bypass graft, RLE angiogram, right superficial femoral artery stenting on 11/22/20. His Eliquis was held for a week prior to surgery, restarted 11/22/20 along with chronic Plavix.  Left finger/forearm numbness related to A-line access site vs. TIA MRI   Negative brain MRI for age. No acute intracranial infarct or other abnormality.  MRA Head 1. 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 Distal reconstitution across the vertebrobasilar junction with perfusion of the left PICA, stable from prior. 2. Markedly attenuated and diminished flow throughout the intracranial left ICA and MCA distribution due to a proximal severe stenosis in the neck (see below). 3. Severe stenoses involving the cavernous segments of both internal carotid arteries as above, new/progressed from previous. 4. Moderate stenosis at the right P1/P2 junction.  MRA neck Apparent severe near occlusive stenosis involving the distal left ICA just prior to the skull base, new from previous. 2. Atheromatous change about the carotid  bifurcations with associated 40-50% on the right. 3. Thready irregular flow throughout the hypoplastic left vertebral artery with occlusion at the V3 segment, stable. 4. Question severe stenosis versus artifact at the origin of the right vertebral artery. Dominant right vertebral artery otherwise patent within the neck. Correlation with dedicated CTA could be performed for further evaluation as warranted.    2D Echo LVEF 60-65%, normal size LA, mild LVH, normal atrial septum    LDL 86 HgbA1c 8.1 VTE prophylaxis - SCD    Diet   Diet heart healthy/carb modified Room service appropriate? Yes; Fluid consistency: Thin   clopidogrel 75 mg daily and Eliquis (apixaban) daily prior to admission, now on clopidogrel 75 mg daily and Eliquis (apixaban) daily.   Therapy recommendations:  No OT follow up. PT: home health PT Disposition:  home health PT>   Hypertension Home meds:  Imdur 30mg  daily, metoprol xl 50mg  daily,  Unstable Permissive hypertension (OK if < 220/120) but gradually normalize in 5-7 days Long-term BP goal normotensive  Hyperlipidemia Home meds:  fenofibrate 160mg ,  resumed in hospital LDL 86, goal < 70 Intolerance to statins   Diabetes type II Uncontrolled Home meds:  insulin HgbA1c 8.1, goal < 7.0 CBGs Recent Labs    11/23/20 2118 11/24/20 0631 11/24/20 1224  GLUCAP 135* 231* 235*    SSI  Other Stroke Risk Factors  Advanced Age >/= 30   Cigarette smoker advised to stop smoking  Obesity, Body mass index is 37.15 kg/m., BMI >/= 30 associated with increased stroke risk, recommend weight loss, diet and exercise as appropriate   Hx stroke/TIA  Family hx stroke ( )  Coronary artery disease  Obstructive sleep apnea, non compliant on CPAP at home   Other Active Problems Atrial fibrillation on apixaban Left common femoral to above-knee popliteal bypass graft with stent at right  SFA. On plavix.    No further inpatient stroke workup indicated, and we will  sign off at this time but remain available to [facility] day # 2     To contact Stroke Continuity provider, please refer to [email or web address] After hours, contact General Neurology

## 2020-11-24 NOTE — Progress Notes (Signed)
Patient's heart rhythm changes from sinus to a-fib. Rate briefly elevated to 160's then quickly dropped to low 100's. BP 115/69. Pt temp is now 99. Pt was sitting up on side of bed at the time he converted to a-fib. He complained of being hot and sweaty. He said he gets night sweats like this at home. Gave pt cold wash cloth and ice water. Encouraged him to rest. He stated he felt better. Remains in a-fib with heart rate ranging from 100-110. Patient has history of a-fib per md notes. If heart rate is greater than 130 pt may have IV Metoprolol but he has not sustained that rate at this point.

## 2020-11-24 NOTE — Progress Notes (Signed)
Patient had temp of 101 administered prn Tylenol.

## 2020-11-24 NOTE — Progress Notes (Signed)
Patient is back in sinus rhythm heart rate 95.

## 2020-11-25 ENCOUNTER — Encounter (HOSPITAL_COMMUNITY): Payer: Self-pay | Admitting: Surgery

## 2020-11-25 LAB — GLUCOSE, CAPILLARY: Glucose-Capillary: 196 mg/dL — ABNORMAL HIGH (ref 70–99)

## 2020-11-25 MED ORDER — HYDROCODONE-ACETAMINOPHEN 5-325 MG PO TABS: 1.0000 | ORAL_TABLET | ORAL | 0 refills | Status: DC | PRN

## 2020-11-25 NOTE — Progress Notes (Signed)
Pt for discharge to home.  Spouse at bedside for transport and for discharge instructions.  IV and telemetry removed, CCMD notified.  [name] [name] has been delivered to room.

## 2020-11-25 NOTE — Progress Notes (Signed)
Progress Note    11/25/2020 7:17 AM 3 Days Post-Op  Subjective:  No complaints except pain with mobilization   Vitals:   11/25/20 0013 11/25/20 0423  BP: (!) 111/52 (!) 153/71  Pulse: 83 82  Resp: 18 18  Temp: 99.2 F (37.3 C) 98.2 F (36.8 C)  SpO2: 94% 94%    Physical Exam: General appearance: Awake, alert in no apparent distress Cardiac: Heart rate and rhythm are regular. Tele: NSR Respirations: Nonlabored Incisions: Left groin, thigh and lower leg incisions are all well approximated without bleeding or hematoma. No significant edema. Extremities: Both feet are warm with intact sensation and motor function.  Ischemic changes noted and unchanged. Pulse/Doppler exam: Brisk left dorsalis pedis, posterior tibial and peroneal artery Doppler signals; AT and PT signal on right      CBC    Component Value Date/Time   WBC 15.5 (H) 11/23/2020 0000   RBC 4.19 (L) 11/23/2020 0000   HGB 11.6 (L) 11/23/2020 0000   HGB 14.3 08/19/2020 0925   HCT 36.4 (L) 11/23/2020 0000   HCT 43.6 08/19/2020 0925   PLT 259 11/23/2020 0000   PLT 370 08/19/2020 0925   MCV 86.9 11/23/2020 0000   MCV 83 08/19/2020 0925   MCH 27.7 11/23/2020 0000   MCHC 31.9 11/23/2020 0000   RDW 14.0 11/23/2020 0000   RDW 14.2 08/19/2020 0925   LYMPHSABS 1.8 08/19/2020 0925   MONOABS 0.6 06/06/2020 0433   EOSABS 0.2 08/19/2020 0925   BASOSABS 0.1 08/19/2020 0925    BMET    Component Value Date/Time   NA 135 11/23/2020 0000   NA 140 08/19/2020 0925   K 4.4 11/23/2020 0000   CL 103 11/23/2020 0000   CO2 26 11/23/2020 0000   GLUCOSE 232 (H) 11/23/2020 0000   BUN 20 11/23/2020 0000   BUN 25 08/19/2020 0925   CREATININE 1.25 (H) 11/23/2020 0000   CALCIUM 8.8 (L) 11/23/2020 0000   GFRNONAA >60 11/23/2020 0000   GFRAA 54 (L) 06/20/2020 1529     Intake/Output Summary (Last 24 hours) at 11/25/2020 0717 Last data filed at 11/25/2020 0433 Gross per 24 hour  Intake 960 ml  Output 1900 ml  Net -940  ml    HOSPITAL MEDICATIONS Scheduled Meds:  apixaban  5 mg Oral BID   clopidogrel  75 mg Oral Daily   dapagliflozin propanediol  10 mg Oral QAC breakfast   docusate sodium  100 mg Oral Daily   DULoxetine  30 mg Oral Daily   fenofibrate  160 mg Oral Daily   gabapentin  900 mg Oral TID   insulin aspart  0-20 Units Subcutaneous TID WC   insulin aspart protamine- aspart  60 Units Subcutaneous BID WC   isosorbide mononitrate  30 mg Oral Daily   LORazepam  2 mg Intravenous Once   metoprolol succinate  50 mg Oral Daily   pantoprazole  40 mg Oral Daily   vitamin B-12  1,000 mcg Oral Daily   Continuous Infusions:  sodium chloride     magnesium sulfate bolus IVPB     PRN Meds:.sodium chloride, acetaminophen **OR** acetaminophen, albuterol, alum & mag hydroxide-simeth, bisacodyl, guaiFENesin-dextromethorphan, hydrALAZINE, labetalol, magnesium sulfate bolus IVPB, metoprolol tartrate, morphine injection, ondansetron, oxyCODONE-acetaminophen, phenol, polyethylene glycol, potassium chloride  Assessment and Plan: POD 3 Left common femoral to above-knee popliteal artery bypass graft with reversed ipsilateral translocated saphenous vein; Right lower extremity angiogram; Stent, right superficial femoral artery. LE well perfused. Continue Plavix. Intolerant of antihyperlipidemics. Continue/encouraged mobilization.  History of atrial fibrillation on apixaban. Possible TIA. Neuro at baseline. Appreciate neurology eval/work-up. Rec: fenofibrate; follow-up with Dr. Jaynee Eagles. (Patient has been participating in clinical trial for hyperlipidemia per wife and they will follow-up with their current recommendations.)  Dispo: Stable for DC with HHPT   Risa Grill, PA-C [facility] [phone] 11/25/2020  7:17 AM

## 2020-11-25 NOTE — TOC Transition Note (Signed)
Transition of Care (TOC) - CM/SW Discharge Note Marvetta Gibbons RN, BSN Transitions of Care Unit 4E- RN Case Manager See Treatment Team for direct phone #    Patient Details  Name: [name] [name] MRN: [id number] Date of Birth: 13-Jul-1950  Transition of Care [facility]) CM/SW Contact:  Dawayne Patricia, RN Phone Number: 11/25/2020, 1:52 PM   Clinical Narrative:    Pt stable for transition home today w/ wife. HH and DME orders placed.  CM spoke with pt and wife at the bedside. List provided for [facility] choice Per CMS guidelines from medicare.gov website with star ratings (copy placed in shadow chart) , after review of list they have decided on [facility] for [facility] needs.   Address, phone #s, and PCP all confirmed in epic.   Call made to [facility] for DME need- RW to be delivered to room prior to discharge.   Call made to [facility] for [facility] referral- referral has been accepted for HHPT needs. Plan for [facility] by Wednesday per Meg at [facility].    Final next level of care: [place] Barriers to Discharge: No Barriers Identified   Patient Goals and CMS Choice Patient states their goals for this hospitalization and ongoing recovery are:: return home CMS Medicare.gov Compare Post Acute Care list provided to:: Patient Choice offered to / list presented to : Patient, Spouse  Discharge Placement               Home with [facility]        Discharge Plan and Services   Discharge Planning Services: CM Consult Post Acute Care Choice: Home Health, Durable Medical Equipment          DME Arranged: Walker rolling DME Agency: [facility] Date DME Agency Contacted: 11/25/20 Time DME Agency Contacted: 32 Representative spoke with at DME Agency: Freda Munro HH Arranged: PT [place]: [place] Date [place]: 11/25/20 Time [place]: 1245 Representative spoke with at [place]: [place] ([place]) Interventions     Readmission Risk  Interventions Readmission Risk Prevention Plan 11/25/2020  Transportation Screening Complete  PCP or Specialist Appt within 5-7 Days Complete  Home Care Screening Complete  Medication Review (RN CM) Complete  Some recent data might be hidden

## 2020-11-25 NOTE — Progress Notes (Signed)
Physical Therapy Treatment Patient Details Name: [name] [name] MRN: [id number] DOB: 28-Oct-1950 Today's Date: 11/25/2020    History of Present Illness 70 yo male admitted 7/8 s/p L common femoral above-knee popliteal artery bypass graft with reversed ipsilateral translocated saphenous vein, RLE angiogram, R superficial femoral artery stent. 7/9 pt with LUE ataxia numbness with MRI negative. PMHx: anxiety, OA, CAD, afib, depression, DM, GERD, HTN, HLD, MI with history of coronary stenting, neuropathy, retinopathy, left basal ganglia infarct    PT Comments    Pt progressing well with mobility, ambulatory for hallway distance with supervision level of assist. Pt with some difficulty with moving to/from EOB and rising from bed surface to stand, but pt sleeps in a lift chair and will not have issue with this at home. PT updated plan to reflect RW, pt's rollator is too wide to fit through some areas in home. Will continue to follow.   HR 80s-120s   Follow Up Recommendations  Home health PT;Supervision/Assistance - 24 hour     Equipment Recommendations  Rolling walker with 5" wheels    Recommendations for Other Services       Precautions / Restrictions Precautions Precautions: Fall Restrictions Weight Bearing Restrictions: No    Mobility  Bed Mobility Overal bed mobility: Needs Assistance Bed Mobility: Supine to Sit;Sit to Supine     Supine to sit: Min assist Sit to supine: Min assist   General bed mobility comments: for LLE lifting into/out of bed, increased time and use of bedrails. Pt sleeps in a recliner at home.    Transfers Overall transfer level: Needs assistance Equipment used: Rolling walker (2 wheeled) Transfers: Sit to/from Stand Sit to Stand: Min assist;From elevated surface         General transfer comment: min assist for initial power up and steadying upon standing, multiple AP rocks to build momentum.  Ambulation/Gait Ambulation/Gait assistance:  Supervision Gait Distance (Feet): 150 Feet Assistive device: Rolling walker (2 wheeled) Gait Pattern/deviations: Step-through pattern;Decreased stride length;Trunk flexed;Antalgic Gait velocity: decr   General Gait Details: supervision for safety, verbal cuing for upright posture. Slowed, steady gait with use of RW.   Stairs             Wheelchair Mobility    Modified Rankin (Stroke Patients Only)       Balance Overall balance assessment: Needs assistance Sitting-balance support: Feet supported Sitting balance-Leahy Scale: Fair     Standing balance support: Bilateral upper extremity supported;During functional activity Standing balance-Leahy Scale: Poor Standing balance comment: reliant on external support                            Cognition Arousal/Alertness: Awake/alert Behavior During Therapy: WFL for tasks assessed/performed Overall Cognitive Status: Within Functional Limits for tasks assessed                                        Exercises Other Exercises Other Exercises: Encouraged up and walking for short household distances at least 5x/day at home with supervision of wife, along with LAQs for maintaining ROM    General Comments General comments (skin integrity, edema, etc.): Pt's IV in R forearm mostly out upon PT arrival to room, RN called to assess IV and states she will remove it.      Pertinent Vitals/Pain Pain Assessment: Faces Faces Pain Scale: Hurts little more Pain Location:  L medial thigh Pain Descriptors / Indicators: Sore;Discomfort;Grimacing Pain Intervention(s): Limited activity within patient's tolerance;Monitored during session    Home Living                      Prior Function            PT Goals (current goals can now be found in the care plan section) Acute Rehab PT Goals Patient Stated Goal: less pain PT Goal Formulation: With patient Time For Goal Achievement: 12/07/20 Potential to  Achieve Goals: Good Progress towards PT goals: Progressing toward goals    Frequency    Min 3X/week      PT Plan Current plan remains appropriate    Co-evaluation              AM-PAC PT "6 Clicks" Mobility   Outcome Measure  Help needed turning from your back to your side while in a flat bed without using bedrails?: A Little Help needed moving from lying on your back to sitting on the side of a flat bed without using bedrails?: A Little Help needed moving to and from a bed to a chair (including a wheelchair)?: A Little Help needed standing up from a chair using your arms (e.g., wheelchair or bedside chair)?: A Little Help needed to walk in hospital room?: A Little Help needed climbing 3-5 steps with a railing? : A Little 6 Click Score: 18    End of Session   Activity Tolerance: Patient limited by fatigue;Patient limited by pain Patient left: with call bell/phone within reach;with family/visitor present;in bed Nurse Communication: Mobility status PT Visit Diagnosis: Other abnormalities of gait and mobility (R26.89);Pain Pain - Right/Left: Left Pain - part of body: Leg     Time: 8867-7373 PT Time Calculation (min) (ACUTE ONLY): 28 min  Charges:  $Gait Training: 23-37 mins                     Stacie Glaze, PT DPT Acute Rehabilitation Services Pager [phone]  Office [phone]    [place] E Ruffin Pyo 11/25/2020, 11:06 AM

## 2020-11-25 NOTE — Progress Notes (Signed)
Mobility Specialist: Progress Note   11/25/20 1234  Mobility  Activity Refused mobility   Pt refused mobility d/t discharging soon.   [facility] [place] Mobility Specialist Mobility Specialist Phone: [phone]

## 2020-11-25 NOTE — Discharge Instructions (Signed)
 [facility]  Discharge instructions  Lower Extremity Bypass Surgery  Please refer to the following instruction for your post-procedure care. Your surgeon or physician assistant will discuss any changes with you.  Activity  You are encouraged to walk as much as you can. You can slowly return to normal activities during the month after your surgery. Avoid strenuous activity and heavy lifting until your doctor tells you it's OK. Avoid activities such as vacuuming or swinging a golf club. Do not drive until your doctor give the OK and you are no longer taking prescription pain medications. It is also normal to have difficulty with sleep habits, eating and bowel movement after surgery. These will go away with time.  Bathing/Showering  You may shower after you go home. Do not soak in a bathtub, hot tub, or swim until the incision heals completely.  Incision Care  Clean your incision with mild soap and water. Shower every day. Pat the area dry with a clean towel. You do not need a bandage unless otherwise instructed. Do not apply any ointments or creams to your incision. If you have open wounds you will be instructed how to care for them or a visiting nurse may be arranged for you. If you have staples or sutures along your incision they will be removed at your post-op appointment. You may have skin glue on your incision. Do not peel it off. It will come off on its own in about one week. If you have a great deal of moisture in your groin, use a gauze help keep this area dry.  Diet  Resume your normal diet. There are no special food restrictions following this procedure. A low fat/ low cholesterol diet is recommended for all patients with vascular disease. In order to heal from your surgery, it is CRITICAL to get adequate nutrition. Your body requires vitamins, minerals, and protein. Vegetables are the best source of vitamins and minerals. Vegetables also provide the  perfect balance of protein. Processed food has little nutritional value, so try to avoid this.  Medications  Resume taking all your medications unless your doctor or nurse practitioner tells you not to. If your incision is causing pain, you may take over-the-counter pain relievers such as acetaminophen (Tylenol). If you were prescribed a stronger pain medication, please aware these medication can cause nausea and constipation. Prevent nausea by taking the medication with a snack or meal. Avoid constipation by drinking plenty of fluids and eating foods with high amount of fiber, such as fruits, vegetables, and grains. Take Colase 100 mg (an over-the-counter stool softener) twice a day as needed for constipation. Do not take Tylenol if you are taking prescription pain medications.  Follow Up  Our office will schedule a follow up appointment 2-3 weeks following discharge.  Please call us immediately for any of the following conditions  Severe or worsening pain in your legs or feet while at rest or while walking Increase pain, redness, warmth, or drainage (pus) from your incision site(s) Fever of 101 degree or higher The swelling in your leg with the bypass suddenly worsens and becomes more painful than when you were in the hospital If you have been instructed to feel your graft pulse then you should do so every day. If you can no longer feel this pulse, call the office immediately. Not all patients are given this instruction.  Leg swelling is common after leg bypass surgery.  The swelling should improve over a few months   following surgery. To improve the swelling, you may elevate your legs above the level of your heart while you are sitting or resting. Your surgeon or physician assistant may ask you to apply an ACE wrap or wear compression (TED) stockings to help to reduce swelling.  Reduce your risk of vascular disease  Stop smoking. If you would like help call [facility] at 1-800-QUIT-NOW  (1-800-784-8669) or  at 336-586-4000.  Manage your cholesterol Maintain a desired weight Control your diabetes weight Control your diabetes Keep your blood pressure down  If you have any questions, please call the office at [phone]   

## 2020-11-25 NOTE — Progress Notes (Signed)
Occupational Therapy Treatment Patient Details Name: [name] [name] MRN: [id number] DOB: July 02, 1950 Today's Date: 11/25/2020    History of present illness 70 yo male admitted 7/8 s/p L common femoral above-knee popliteal artery bypass graft with reversed ipsilateral translocated saphenous vein, RLE angiogram, R superficial femoral artery stent. 7/9 pt with LUE ataxia numbness with MRI negative. PMHx: anxiety, OA, CAD, afib, depression, DM, GERD, HTN, HLD, MI with history of coronary stenting, neuropathy, retinopathy, left basal ganglia infarct   OT comments  Patient supine in bed and agreeable to OT.  Completing toilet transfers with min assist, cueing for hand placement and technique as well as using rocking to build momentum.  Toileting with supervision, LB dressing with min assist for L LE.  Patient will have support of spouse at home for ADL assist as needed.  DC plan remains appropriate.    Follow Up Recommendations  No OT follow up    Equipment Recommendations  None recommended by OT    Recommendations for Other Services      Precautions / Restrictions Precautions Precautions: Fall Restrictions Weight Bearing Restrictions: No       Mobility Bed Mobility Overal bed mobility: Needs Assistance Bed Mobility: Supine to Sit     Supine to sit: Supervision Sit to supine: Min assist   General bed mobility comments: HOB elevated, reports sleeping in recliner at home    Transfers     Balance Overall balance assessment: Needs assistance Sitting-balance support: No upper extremity supported;Feet supported Sitting balance-Leahy Scale: Fair     Standing balance support: Single extremity supported;Bilateral upper extremity supported;During functional activity Standing balance-Leahy Scale: Poor Standing balance comment: reliant on external support                           ADL either performed or assessed with clinical judgement   ADL Overall ADL's : Needs  assistance/impaired                 Upper Body Dressing : Set up;Sitting   Lower Body Dressing: Minimal assistance;Sit to/from stand Lower Body Dressing Details (indicate cue type and reason): requires assist for L sock and shoe, min assist sit to stand Toilet Transfer: Minimal assistance;Ambulation;RW;Grab bars Toilet Transfer Details (indicate cue type and reason): min assist to power up from commode using grabbars and cueing for hand placement Toileting- Clothing Manipulation and Hygiene: Supervision/safety;Sitting/lateral lean       Functional mobility during ADLs: Supervision/safety;Rolling walker General ADL Comments: spouse present and supportive, can assist with ADLs as needed; educated on use of gait belt at home     Vision       Perception     Praxis      Cognition Arousal/Alertness: Awake/alert Behavior During Therapy: WFL for tasks assessed/performed Overall Cognitive Status: Within Functional Limits for tasks assessed                                          Exercises    Shoulder Instructions       General Comments     Pertinent Vitals/ Pain       Pain Assessment: Faces Faces Pain Scale: Hurts little more Pain Location: L medial thigh Pain Descriptors / Indicators: Sore;Discomfort;Grimacing Pain Intervention(s): Limited activity within patient's tolerance;Monitored during session;Repositioned  Home Living  Prior Functioning/Environment              Frequency  Min 2X/week        Progress Toward Goals  OT Goals(current goals can now be found in the care plan section)  Progress towards OT goals: Progressing toward goals  Acute Rehab OT Goals Patient Stated Goal: less pain OT Goal Formulation: With patient  Plan Discharge plan remains appropriate;Frequency remains appropriate    Co-evaluation                 AM-PAC OT "6 Clicks" Daily Activity      Outcome Measure   Help from another person eating meals?: None Help from another person taking care of personal grooming?: None Help from another person toileting, which includes using toliet, bedpan, or urinal?: A Little Help from another person bathing (including washing, rinsing, drying)?: A Little Help from another person to put on and taking off regular upper body clothing?: None Help from another person to put on and taking off regular lower body clothing?: A Little 6 Click Score: 21    End of Session Equipment Utilized During Treatment: Rolling walker  OT Visit Diagnosis: Unsteadiness on feet (R26.81);Pain Pain - Right/Left: Left Pain - part of body: Leg   Activity Tolerance Patient tolerated treatment well   Patient Left with call bell/phone within reach;Other (comment) (seated EOB)   Nurse Communication Mobility status        Time: 1607-3710 OT Time Calculation (min): 32 min  Charges: OT General Charges $OT Visit: 1 Visit OT Treatments $Self Care/Home Management : 23-37 mins  [place] Pager [phone] Office [phone]    Delight Stare 11/25/2020, 1:11 PM

## 2020-11-27 NOTE — Progress Notes (Signed)
Cardiology Office Note    Date:  11/27/2020   ID:  [name] [name], DOB 05-Oct-1950, MRN [id number]  PCP:  [name], FNP  Cardiologist: Jenkins Rouge, MD    No chief complaint on file.   History of Present Illness:    69 y.o. with history of PAF, PAD, HTN, HLD IDDM prior CVA. Extensive history of CAD with most recent cath 05/24/20 resulting in stents to circumflex/OM for in stent restenosis Chronic CTO RCA with good left to right collaterals Occluded D2 and distal circumflex with Left to left collaterals EF 50-55% by TTE June 2021 Has had PAF with conversion with amiodarone ? Some dizziness with medicine and dose decreased by primary 07/18/20 to 100 mg daily Also noted BP to be a bit low with postural dizziness Of note he is on lasix and [place]. He also takes imdur 30 mg and Toprol 50 mg daily   He sees Dr Lovena Le for his PAF and had ILR implanted for cryptogenic stroke 07/18/20 seen by primary [name] and felt terrible She did ECG but failed to mention he was back in afib His ARB seems to drop his BP too low and he was started on Farxiga while still taking lasix daily    He has significant back problems and may need surgery  Or injection I told him the earlierst This could be done is July given recent stent and need for plavix   PVD followed by [name] Stents to right SFA 04/17/19 unable to recanalize left  Had hybrid procedure 11/22/20 with  Left fem-pop and stent right SFA  Doing well no angina taking lasix as needed but has significant LE edema     Past Medical History:  Diagnosis Date   Anxiety    Arthritis    Atrial fibrillation ([place])    CAD (coronary artery disease)    a. 2010: DES to CTO of RCA. EF 55% b. 07/2016: cath showing total occlusion within previously placed RCA stent (collaterals present), severe stenosis along LCx and OM1 (treated with 2 overlapping DES). c. repeat cath in 01/2018 showing patent stents along LCx and OM with CTO of D2, CTO of distal LCx, and  CTO of RCA with collaterals present overall unchanged since 2018 with medical management recom   Cellulitis and abscess rt groin    Complication of anesthesia    " I woke up during a colonoscopy "      Depression    Diabetes mellitus    Diastolic CHF ([place])    Disorders of iron metabolism    Dysrhythmia    GERD (gastroesophageal reflux disease)    Hyperlipidemia    Hypertension    Low serum testosterone level    Medically noncompliant    Myocardial infarction [facility])     Past Surgical History:  Procedure Laterality Date   ABDOMINAL AORTOGRAM W/LOWER EXTREMITY N/A 03/21/2019   Procedure: ABDOMINAL AORTOGRAM W/LOWER EXTREMITY;  Surgeon: [name], MD;  Location: [place] [facility];  Service: Cardiovascular;  Laterality: N/A;   ABDOMINAL AORTOGRAM W/LOWER EXTREMITY N/A 11/12/2020   Procedure: ABDOMINAL AORTOGRAM W/LOWER EXTREMITY;  Surgeon: [name], MD;  Location: [place] [facility];  Service: Cardiovascular;  Laterality: N/A;   BACK SURGERY  2015   ACDF by Dr. [name]   COLONOSCOPY N/A 10/01/2014   Dr. [name]: multiple tubular adenomas removed, colonic diverticulosis, redundant colon. next tcs advised for 09/2017. PATIENT NEEDS PROPOFOL FOR FAILED CONSCIOUS SEDATION   CORONARY STENT INTERVENTION N/A  07/30/2016   Procedure: Coronary Stent Intervention;  Surgeon: [name], MD;  Location: [place] [facility];  Service: Cardiovascular;  Laterality: N/A;   CORONARY STENT PLACEMENT  2000   By Dr. Olevia Perches   EP IMPLANTABLE DEVICE N/A 05/25/2016   Procedure: Loop Recorder Insertion;  Surgeon: [name], MD;  Location: [place] [facility];  Service: Cardiovascular;  Laterality: N/A;   ESOPHAGOGASTRODUODENOSCOPY     esophagus stretched remotely at [facility]   ESOPHAGOGASTRODUODENOSCOPY N/A 10/01/2014   Dr. [name]: patchy mottling/erythema and minimal polypoid appearance of gastric mucosa. bx with mild inlammation but no H.pylori   FEMORAL-POPLITEAL BYPASS GRAFT Left 11/22/2020    Procedure: LEFT FEMORAL-POPLITEAL BYPASS GRAFT;  Surgeon: [name], MD;  Location: [place];  Service: Vascular;  Laterality: Left;   HERNIA REPAIR  [id number]   umbilical   INSERTION OF ILIAC STENT Right 11/22/2020   Procedure: INSERTION OF ELUVIA STENT INTO RIGHT DISTAL SUPERFICIAL FEMORAL ARTERY;  Surgeon: [name], MD;  Location: [place];  Service: Vascular;  Laterality: Right;   LEFT HEART CATH AND CORONARY ANGIOGRAPHY N/A 07/30/2016   Procedure: Left Heart Cath and Coronary Angiography;  Surgeon: [name], MD;  Location: [place] [facility];  Service: Cardiovascular;  Laterality: N/A;   LEFT HEART CATH AND CORONARY ANGIOGRAPHY N/A 01/19/2018   Procedure: LEFT HEART CATH AND CORONARY ANGIOGRAPHY;  Surgeon: [name], MD;  Location: [place] [facility];  Service: Cardiovascular;  Laterality: N/A;   LEFT HEART CATH AND CORONARY ANGIOGRAPHY N/A 05/24/2020   Procedure: LEFT HEART CATH AND CORONARY ANGIOGRAPHY;  Surgeon: Burnell Blanks, MD;  Location: [place] [facility];  Service: Cardiovascular;  Laterality: N/A;   LESION REMOVAL     Lip and hand    LOWER EXTREMITY ANGIOGRAM Right 11/22/2020   Procedure: RIGHT LEG ANGIOGRAM;  Surgeon: [name], MD;  [facility];  Service: Vascular;  Laterality: Right;   LOWER EXTREMITY ANGIOGRAPHY N/A 04/18/2019   Procedure: LOWER EXTREMITY ANGIOGRAPHY;  Surgeon: [name], MD;  Location: [place] [facility];  Service: Cardiovascular;  Laterality: N/A;   NECK SURGERY     PERIPHERAL VASCULAR BALLOON ANGIOPLASTY  04/18/2019   Procedure: PERIPHERAL VASCULAR BALLOON ANGIOPLASTY;  Surgeon: [name], MD;  Location: [place] [facility];  Service: Cardiovascular;;   PERIPHERAL VASCULAR BALLOON ANGIOPLASTY Left 11/12/2020   Procedure: PERIPHERAL VASCULAR BALLOON ANGIOPLASTY;  Surgeon: [name], MD;  Location: [place] [facility];  Service: Cardiovascular;  Laterality: Left;  Failed PTA of superficial femoral artery.    PERIPHERAL VASCULAR INTERVENTION Right 03/21/2019   Procedure: PERIPHERAL VASCULAR INTERVENTION;  Surgeon: [name], MD;  Location: [place] [facility];  Service: Cardiovascular;  Laterality: Right;  superficial femoral    Current Medications: Outpatient Medications Prior to Visit  Medication Sig Dispense Refill   albuterol (VENTOLIN HFA) 108 (90 Base) MCG/ACT inhaler Inhale 2 puffs into the lungs every 6 (six) hours as needed for wheezing or shortness of breath. 8.5 g 3   apixaban (ELIQUIS) 5 MG TABS tablet Take 1 tablet (5 mg total) by mouth 2 (two) times daily. 180 tablet 1   Chlorphen-Phenyleph-ASA (ALKA-SELTZER PLUS COLD PO) Take 2 tablets by mouth daily as needed (allergies).     clopidogrel (PLAVIX) 75 MG tablet Take 1 tablet by mouth once daily 90 tablet 0   dapagliflozin propanediol (FARXIGA) 10 MG TABS tablet Take 1 tablet (10 mg total) by mouth daily before breakfast. 90 tablet  3   DULoxetine (CYMBALTA) 30 MG capsule Take 1 capsule by mouth once daily 90 capsule 0   fenofibrate 160 MG tablet TAKE 1 TABLET BY MOUTH ONCE DAILY FOR CHOLESTEROL AND TRIGLYCERIDES 90 tablet 3   furosemide (LASIX) 20 MG tablet TAKE 1 TABLET BY MOUTH ONCE DAILY AS NEEDED FOR EDEMA 90 tablet 1   gabapentin (NEURONTIN) 300 MG capsule Take 3 capsules (900 mg total) by mouth 3 (three) times daily. 810 capsule 3   HYDROcodone-acetaminophen (NORCO/VICODIN) 5-325 MG tablet Take 1 tablet by mouth every 4 (four) hours as needed for moderate pain. 30 tablet 0   insulin lispro (HUMALOG KWIKPEN) 100 UNIT/ML KwikPen Inject 15-20 Units into the skin 3 (three) times daily with meals. 15 mL 11   insulin NPH-regular Human (70-30) 100 UNIT/ML injection Inject 60 Units into the skin 2 (two) times daily with a meal.     isosorbide mononitrate (IMDUR) 30 MG 24 hr tablet Take 1 tablet by mouth once daily 90 tablet 0   linaclotide (LINZESS) 72 MCG capsule Take 1 capsule (72 mcg total) by mouth daily as needed (constipation).  90 capsule 3   Menthol, Topical Analgesic, (BLUE-EMU MAXIMUM STRENGTH EX) Apply 1 application topically 2 (two) times daily.     metoprolol succinate (TOPROL-XL) 50 MG 24 hr tablet Take 1 tablet by mouth once daily 90 tablet 3   metoprolol tartrate (LOPRESSOR) 50 MG tablet Take 50 mg by mouth daily as needed (a-fib).     nitroGLYCERIN (NITROSTAT) 0.4 MG SL tablet DISSOLVE ONE TABLET UNDER THE TONGUE EVERY 5 MINUTES AS NEEDED FOR CHEST PAIN.  DO NOT EXCEED A TOTAL OF 3 DOSES IN 15 MINUTES 25 tablet 0   pantoprazole (PROTONIX) 40 MG tablet Take 1 tablet by mouth once daily 90 tablet 1   Semaglutide, 1 MG/DOSE, (OZEMPIC, 1 MG/DOSE,) 2 MG/1.5ML SOPN Inject 0.75 mLs (1 mg total) into the skin once a week. (Patient not taking: Reported on 11/19/2020) 12 mL 4   triamcinolone (NASACORT) 55 MCG/ACT AERO nasal inhaler Place 1 spray into the nose as needed (allergies).     vitamin B-12 (CYANOCOBALAMIN) 1000 MCG tablet Take 1,000 mcg by mouth daily.     No facility-administered medications prior to visit.     Allergies:   Shellfish allergy, Sulfa antibiotics, Ace inhibitors, Invokana [canagliflozin], Lisinopril, Metformin and related, Pravastatin sodium, Milk-related compounds, Crestor [rosuvastatin], Fenofibrate, Horse-derived products, Iodine, Lexapro [escitalopram oxalate], Lipitor [atorvastatin], Livalo [pitavastatin], Repatha [evolocumab], and Tape   Social History   Socioeconomic History   Marital status: Married    Spouse name: Not on file   Number of children: 2   Years of education: 12   Highest education level: 12th grade  Occupational History   Occupation: retired  Tobacco Use   Smoking status: Former    Packs/day: 0.25    Years: 51.00    Pack years: 12.75    Types: Cigarettes    Quit date: 08/14/2016    Years since quitting: 4.2   Smokeless tobacco: Never   Tobacco comments:    smokes  a pack a week  Vaping Use   Vaping Use: Never used  Substance and Sexual Activity   Alcohol use:  No    Alcohol/week: 0.0 standard drinks   Drug use: No   Sexual activity: Yes  Other Topics Concern   Not on file  Social History Narrative   Lives with his wife.  Retired.  Unable to afford expensive medicines.  Caffeine: 1 cup of 1/2 caff coffee in AM, drinks more if at a restaurant    Social Determinants of Health   Financial Resource Strain: Not on file  Food Insecurity: Not on file  Transportation Needs: Not on file  Physical Activity: Not on file  Stress: Not on file  Social Connections: Not on file     Family History:  The patient's family history includes Alzheimer's disease in his mother; Arthritis in his father and mother; Arthritis/Rheumatoid in his sister; Dementia in his maternal aunt and maternal uncle; Depression in his sister; Diabetes in his father and sister; Headache in his mother; Heart disease in his father and maternal uncle; Hyperlipidemia in his mother and sister; Hypertension in his mother and sister; Lung cancer in his mother; Stomach cancer in his paternal uncle; Stroke in his father and mother; Valvular heart disease in his father.   Review of Systems:   Please see the history of present illness.     General:  No chills, fever, night sweats or weight changes.  Cardiovascular:  No chest pain, dyspnea on exertion, edema, orthopnea,  paroxysmal nocturnal dyspnea. Positive for palpitations (now resolved).  Dermatological: No rash, lesions/masses Respiratory: No cough, dyspnea Urologic: No hematuria, dysuria Abdominal:   No nausea, vomiting, diarrhea, bright red blood per rectum, melena, or hematemesis Neurologic:  No visual changes, wkns, changes in mental status. All other systems reviewed and are otherwise negative except as noted above.   Physical Exam:    VS:  There were no vitals taken for this visit.   Affect appropriate Chronically ill male  HEENT: normal Neck supple with no adenopathy JVP normal no bruits no thyromegaly Lungs clear  with no wheezing and good diaphragmatic motion Heart:  S1/S2 no murmur, no rub, gallop or click PMI normal  ILR under left pectoral area  Abdomen: benighn, BS positve, no tenderness, no AAA no bruit.  No HSM or HJR Post right fem pop bypass  Plus 2 edema left > right  Post left fem pop    Wt Readings from Last 3 Encounters:  11/25/20 133.9 kg  11/20/20 131.3 kg  11/19/20 129.7 kg     Studies/Labs Reviewed:   EKG:  afib rate 91 07/18/20  Recent Labs: 06/06/2020: B Natriuretic Peptide 218.2 11/20/2020: ALT 21 11/23/2020: BUN 20; Creatinine, Ser 1.25; Hemoglobin 11.6; Platelets 259; Potassium 4.4; Sodium 135 11/24/2020: TSH 2.281   Lipid Panel    Component Value Date/Time   CHOL 173 11/24/2020 0211   CHOL 232 (H) 02/14/2020 1135   TRIG 308 (H) 11/24/2020 0211   HDL 25 (L) 11/24/2020 0211   HDL 15 (L) 02/14/2020 1135   CHOLHDL 6.9 11/24/2020 0211   VLDL 62 (H) 11/24/2020 0211   LDLCALC 86 11/24/2020 0211   LDLCALC 108 (H) 02/14/2020 1135   LDLDIRECT 172 (H) 11/08/2014 0908    Additional studies/ records that were reviewed today include:   Echocardiogram: 10/2019 IMPRESSIONS     1. Inferior basal akinesis . Left ventricular ejection fraction, by  estimation, is 50 to 55%. The left ventricle has low normal function. The  left ventricle demonstrates regional wall motion abnormalities (see  scoring diagram/findings for description).  There is mild left ventricular hypertrophy. Left ventricular diastolic  parameters were normal.   2. Right ventricular systolic function is normal. The right ventricular  size is normal.   3. The mitral valve is normal in structure. Trivial mitral valve  regurgitation. No evidence of mitral stenosis.   4. Calcification  of the non coronary cusp . The aortic valve is  tricuspid. Aortic valve regurgitation is not visualized. Mild to moderate  aortic valve sclerosis/calcification is present, without any evidence of  aortic stenosis.   5. The  inferior vena cava is dilated in size with >50% respiratory  variability, suggesting right atrial pressure of 8 mmHg.   Cardiac Catheterization: 05/24/2020 Prox LAD lesion is 40% stenosed. Ost 1st Diag lesion is 40% stenosed. Ost 2nd Diag to 2nd Diag lesion is 100% stenosed. Mid Cx to Dist Cx lesion is 100% stenosed. Prox RCA to Mid RCA lesion is 90% stenosed. Prox RCA lesion is 99% stenosed. Ost 1st Mrg to 1st Mrg lesion is 80% stenosed. A drug-eluting stent was successfully placed using a STENT RESOLUTE ONYX [id number]. Post intervention, there is a 0% residual stenosis. Prox Cx lesion is 95% stenosed. A drug-eluting stent was successfully placed using a STENT RESOLUTE ONYX 3.0X22. Post intervention, there is a 0% residual stenosis.   1. Moderate mid LAD stenosis unchanged. Chronic occlusion Diagonal 1. Severe disease small diagonal 2.  2. Severe restenosis Circumflex/OM stents.  3. Successful PTCA/DES x 2 with overlapping Resolute Onyx DES in the Circumflex/OM 4. Chronic occlusion mid RCA in old stented segment. The distal vessel fills briskly from left to right collaterals.    Recommendations: Continue Plavix. Resume anticoagulation tomorrow. Will continue ASA 81 mg daily until he is fully anti-coagulated then OK to stop ASA.      Plan:   In order of problems listed above:  1. Paroxysmal Atrial Fibrillation - Amiodarone d/c due to visual side effects Consider Multaq or Dofetilide for recurrence f/u GT   2. CAD - DES to RCA 2010 with f/u cath 2018 showing CTO RCA with collaterals.  Cath 05/24/20 with new stents placed to to circumflex and OM1 with occluded D2 and continued left to right collaterals to distal RCA and left to left collaterals to distal circumflex Continue Plavix needs eliquis for PAF   3. PAD - Followed by Vascular Surgery [facility] Failed angioplasty of left SFA for ulcer 11/12/20 with left fem-pop bypass 11/22/20 With hybrid stenting of right SFA Lasix 20 mg bid for a  week then daily BMET in 3 weeks f/u [name] next Friday   4. HTN - actually low now d/c ARB    5. HLD - LDL had improved to 75 when checked earlier this month. He has been intolerant to statins and Repatha. Continues to participate in the CLEAR trial.   F/U GT  EP  F/U [name] F/U me in a year   Laxis 20 mg bid for a week then daily  BMET 3 weeks     Signed, Jenkins Rouge, MD  11/27/2020 8:18 AM    [facility] [address] [place], [place] [address] Phone: [phone] Fax: [phone]

## 2020-11-28 DIAGNOSIS — Z794 Long term (current) use of insulin: Secondary | ICD-10-CM | POA: Diagnosis not present

## 2020-11-28 DIAGNOSIS — Z48812 Encounter for surgical aftercare following surgery on the circulatory system: Secondary | ICD-10-CM | POA: Diagnosis not present

## 2020-11-28 DIAGNOSIS — R2681 Unsteadiness on feet: Secondary | ICD-10-CM | POA: Diagnosis not present

## 2020-11-28 DIAGNOSIS — I4891 Unspecified atrial fibrillation: Secondary | ICD-10-CM | POA: Diagnosis not present

## 2020-11-28 DIAGNOSIS — E119 Type 2 diabetes mellitus without complications: Secondary | ICD-10-CM | POA: Diagnosis not present

## 2020-11-28 DIAGNOSIS — I2510 Atherosclerotic heart disease of native coronary artery without angina pectoris: Secondary | ICD-10-CM | POA: Diagnosis not present

## 2020-11-28 DIAGNOSIS — Z95828 Presence of other vascular implants and grafts: Secondary | ICD-10-CM | POA: Diagnosis not present

## 2020-11-28 DIAGNOSIS — I739 Peripheral vascular disease, unspecified: Secondary | ICD-10-CM | POA: Diagnosis not present

## 2020-11-28 DIAGNOSIS — Z7984 Long term (current) use of oral hypoglycemic drugs: Secondary | ICD-10-CM | POA: Diagnosis not present

## 2020-11-29 ENCOUNTER — Telehealth: Payer: Self-pay | Admitting: *Deleted

## 2020-11-29 DIAGNOSIS — R5381 Other malaise: Secondary | ICD-10-CM

## 2020-11-29 NOTE — Telephone Encounter (Signed)
VM from [place] [facility] w/ [facility] for VO for PT as well as Drug interaction with Apixaban & Clopidogrel

## 2020-12-02 DIAGNOSIS — Z794 Long term (current) use of insulin: Secondary | ICD-10-CM | POA: Diagnosis not present

## 2020-12-02 DIAGNOSIS — I4891 Unspecified atrial fibrillation: Secondary | ICD-10-CM | POA: Diagnosis not present

## 2020-12-02 DIAGNOSIS — Z7984 Long term (current) use of oral hypoglycemic drugs: Secondary | ICD-10-CM | POA: Diagnosis not present

## 2020-12-02 DIAGNOSIS — I739 Peripheral vascular disease, unspecified: Secondary | ICD-10-CM | POA: Diagnosis not present

## 2020-12-02 DIAGNOSIS — Z48812 Encounter for surgical aftercare following surgery on the circulatory system: Secondary | ICD-10-CM | POA: Diagnosis not present

## 2020-12-02 DIAGNOSIS — I2510 Atherosclerotic heart disease of native coronary artery without angina pectoris: Secondary | ICD-10-CM | POA: Diagnosis not present

## 2020-12-02 DIAGNOSIS — R2681 Unsteadiness on feet: Secondary | ICD-10-CM | POA: Diagnosis not present

## 2020-12-02 DIAGNOSIS — Z95828 Presence of other vascular implants and grafts: Secondary | ICD-10-CM | POA: Diagnosis not present

## 2020-12-02 DIAGNOSIS — E119 Type 2 diabetes mellitus without complications: Secondary | ICD-10-CM | POA: Diagnosis not present

## 2020-12-03 ENCOUNTER — Telehealth: Payer: Self-pay | Admitting: *Deleted

## 2020-12-03 NOTE — Discharge Summary (Signed)
Discharge Summary     [name] [name] January 29, 1951 70 y.o. male  [id number]  Admission Date: 11/22/2020  Discharge Date: 11/25/2020  Physician: Dr. Trula [name]  Admission Diagnosis: S/P femoral-popliteal bypass surgery [Z95.828] PAOD (peripheral arterial occlusive disease) ([place]) [I77.9]  HPI:   This is a 70 y.o. male with bilateral ulcers.  He has previously undergone stenting of his right superficial femoral artery  Hospital Course:  The patient was admitted to the hospital and taken to the operating room on 11/22/2020 and underwent: Procedure:   #1: Left common femoral to above-knee popliteal artery bypass graft with reversed ipsilateral translocated saphenous vein                       #2: Right lower extremity angiogram                       #3: Stent, right superficial femoral artery    Findings:  "4 mm left saphenous vein.  The left proximal anastomosis was to the distal left common femoral artery.  The left distal anastomosis was to a mildly calcified but widely patent above-knee popliteal artery.  On the right, there was a 80% stenosis just distal to the previously placed stent.  A 7 x 60 Elluvia stent was placed that resolved the stenosis.  The patient has a proximal peroneal artery occlusion which is his dominant runoff.  The peroneal artery reconstitutes from collaterals.  I was unable to cross the occlusion.  If he continues to have difficulty, the next option would be a retrograde approach possibly from the posterior tibial or the peroneal artery."    The pt tolerated the procedure well and was transported to the PACU in excellent condition.   By POD 1, his vital signs were stable and left lower extremity well perfused. Plavix continued and Eliquis was resumed. H and H were stable. He complained of left 1,2,3 digit numbness and LUE clumsiness. Neurology was consulted and MRA of head and neck performed. CTA of head and neck recommended but patient refused. His symptoms resolved.    The following days were focused on mobility and pain control. His left lower extremity bypass remained patent. Brisk left dorsalis pedis, posterior tibial and peroneal artery Doppler signals; AT and PT signal on right.  The remainder of the hospital course consisted of increasing mobilization and increasing intake of solids without difficulty.  CBC    Component Value Date/Time   WBC 15.5 (H) 11/23/2020 0000   RBC 4.19 (L) 11/23/2020 0000   HGB 11.6 (L) 11/23/2020 0000   HGB 14.3 08/19/2020 0925   HCT 36.4 (L) 11/23/2020 0000   HCT 43.6 08/19/2020 0925   PLT 259 11/23/2020 0000   PLT 370 08/19/2020 0925   MCV 86.9 11/23/2020 0000   MCV 83 08/19/2020 0925   MCH 27.7 11/23/2020 0000   MCHC 31.9 11/23/2020 0000   RDW 14.0 11/23/2020 0000   RDW 14.2 08/19/2020 0925   LYMPHSABS 1.8 08/19/2020 0925   MONOABS 0.6 06/06/2020 0433   EOSABS 0.2 08/19/2020 0925   BASOSABS 0.1 08/19/2020 0925    BMET    Component Value Date/Time   NA 135 11/23/2020 0000   NA 140 08/19/2020 0925   K 4.4 11/23/2020 0000   CL 103 11/23/2020 0000   CO2 26 11/23/2020 0000   GLUCOSE 232 (H) 11/23/2020 0000   BUN 20 11/23/2020 0000   BUN 25 08/19/2020 0925   CREATININE 1.25 (H) 11/23/2020  0000   CALCIUM 8.8 (L) 11/23/2020 0000   GFRNONAA >60 11/23/2020 0000   GFRAA 54 (L) 06/20/2020 1529     Discharge Instructions     Ambulatory referral to Neurology   Complete by: As directed    Follow up with Dr. Jaynee [name] at [facility] in 4-6 weeks.  Patient is Dr. Cathren [name] patient. Thanks.   Discharge patient   Complete by: As directed    Discharge disposition: 06-Home-Health Care Svc   Discharge patient date: 11/25/2020   Face-to-face encounter (required for Medicare/Medicaid patients)   Complete by: As directed    I [name] [name] certify that this patient is under my care and that I, or a nurse practitioner or physician's assistant working with me, had a face-to-face encounter that meets the physician face-to-face  encounter requirements with this patient on 11/25/2020. The encounter with the patient was in whole, or in part for the following medical condition(s) which is the primary reason for home health care (List medical condition): PAD   The encounter with the patient was in whole, or in part, for the following medical condition, which is the primary reason for home health care: PAd   I certify that, based on my findings, the following services are medically necessary home health services: Physical therapy   Reason for Medically Necessary Home Health Services: Therapy- Personnel officer, Training and development officer and Stair Training   My clinical findings support the need for the above services: Pain interferes with ambulation/mobility   Further, I certify that my clinical findings support that this patient is homebound due to: Pain interferes with ambulation/mobility   Home Health   Complete by: As directed    To provide the following care/treatments: PT       Discharge Diagnosis:  S/P femoral-popliteal bypass surgery [Z95.828] PAOD (peripheral arterial occlusive disease) ([place]) [I77.9]  Secondary Diagnosis: Patient Active Problem List   Diagnosis Date Noted   S/P femoral-popliteal bypass surgery 11/22/2020   PAOD (peripheral arterial occlusive disease) ([place]) 11/22/2020   Non-ST elevation (NSTEMI) myocardial infarction ([place]) 05/23/2020   Orthostatic hypotension 01/01/2020   Statin myopathy 12/06/2019   Diarrhea 05/10/2018   PAF (paroxysmal atrial fibrillation) ([place]) 03/29/2018   Lacunar infarction ([place]) 03/29/2018   Unstable angina ([place])    Chest pain 01/18/2018   CHF (congestive heart failure) ([place]) 10/21/2017   Anxiety 03/09/2017   Constipation 11/26/2016   Diabetes ([place]) 05/25/2016   Occlusion and stenosis of vertebral artery    Acute renal failure ([place]) 05/23/2016   Diabetic neuropathy ([place]) 01/13/2016   Depression 11/01/2015   Erectile dysfunction 01/23/2015   Morbid obesity ([place]) 01/23/2015    Periodontal disease 01/23/2015   Stopped smoking with greater than 30 pack year history 01/23/2015   Diabetic peripheral neuropathy associated with type 2 diabetes mellitus ([place]) 11/08/2014   Mucosal abnormality of stomach    History of colonic polyps    Abdominal pain 09/06/2014   GERD (gastroesophageal reflux disease) 09/06/2014   Low serum testosterone level 08/01/2014   DDD (degenerative disc disease), cervical 12/20/2013   Headache 08/07/2013   Medically noncompliant 10/25/2011   CAD S/P percutaneous coronary angioplasty 10/25/2011   MURMUR 04/16/2009   Hypertension associated with diabetes ([place]) 08/02/2008   Hyperlipidemia associated with type 2 diabetes mellitus ([place]) 07/31/2008   DISORDERS OF IRON METABOLISM 07/31/2008   Past Medical History:  Diagnosis Date   Anxiety    Arthritis    Atrial fibrillation (HCC)    CAD (coronary artery disease)    a.  2010: DES to CTO of RCA. EF 55% b. 07/2016: cath showing total occlusion within previously placed RCA stent (collaterals present), severe stenosis along LCx and OM1 (treated with 2 overlapping DES). c. repeat cath in 01/2018 showing patent stents along LCx and OM with CTO of D2, CTO of distal LCx, and CTO of RCA with collaterals present overall unchanged since 2018 with medical management recom   Cellulitis and abscess rt groin    Complication of anesthesia    " I woke up during a colonoscopy "      Depression    Diabetes mellitus    Diastolic CHF ([place])    Disorders of iron metabolism    Dysrhythmia    GERD (gastroesophageal reflux disease)    Hyperlipidemia    Hypertension    Low serum testosterone level    Medically noncompliant    Myocardial infarction [facility])      Allergies as of 11/25/2020       Reactions   Shellfish Allergy Anaphylaxis, Other (See Comments)   Tongue swelling, hives   Sulfa Antibiotics Anaphylaxis, Rash   Tongue swelling, hives   Ace Inhibitors Other (See Comments), Cough   CKD, renal failure     Invokana [canagliflozin] Other (See Comments)   Syncope / dehydration   Lisinopril Cough   Metformin And Related Itching   Pravastatin Sodium Other (See Comments)   myalgias   Milk-related Compounds    Ties stomach in knots    Crestor [rosuvastatin] Other (See Comments)   Myalgias   Fenofibrate Other (See Comments)   Body aches - pt currently taking isnt sure if its causing any pain   Horse-derived Products Rash   Iodine Other (See Comments)   ????. PATIENT SAYS HE DID FINE WITH LAST CT WITH CONTRAST** patient had IV contrast on 06/21/2018 without pre meds without any issues. Patient had allergic reaction to Shellfish 30 years ago-not to IV contrast.    Lexapro [escitalopram Oxalate] Other (See Comments)   Buzzing in ears,headache, felt like a zombie   Lipitor [atorvastatin] Other (See Comments)   myalgias   Livalo [pitavastatin] Other (See Comments)   Myalgias   Repatha [evolocumab] Other (See Comments)   Myalgias, flu like sx   Tape Rash        Medication List     STOP taking these medications    HYDROcodone-acetaminophen 10-325 MG tablet Commonly known as: NORCO Replaced by: HYDROcodone-acetaminophen 5-325 MG tablet       TAKE these medications    albuterol 108 (90 Base) MCG/ACT inhaler Commonly known as: VENTOLIN HFA Inhale 2 puffs into the lungs every 6 (six) hours as needed for wheezing or shortness of breath.   ALKA-SELTZER PLUS COLD PO Take 2 tablets by mouth daily as needed (allergies).   apixaban 5 MG Tabs tablet Commonly known as: Eliquis Take 1 tablet (5 mg total) by mouth 2 (two) times daily.   BLUE-EMU MAXIMUM STRENGTH EX Apply 1 application topically 2 (two) times daily.   clopidogrel 75 MG tablet Commonly known as: PLAVIX Take 1 tablet by mouth once daily   dapagliflozin propanediol 10 MG Tabs tablet Commonly known as: Farxiga Take 1 tablet (10 mg total) by mouth daily before breakfast.   DULoxetine 30 MG capsule Commonly known as:  CYMBALTA Take 1 capsule by mouth once daily   fenofibrate 160 MG tablet TAKE 1 TABLET BY MOUTH ONCE DAILY FOR CHOLESTEROL AND TRIGLYCERIDES   furosemide 20 MG tablet Commonly known as: LASIX TAKE 1 TABLET  BY MOUTH ONCE DAILY AS NEEDED FOR EDEMA   gabapentin 300 MG capsule Commonly known as: NEURONTIN Take 3 capsules (900 mg total) by mouth 3 (three) times daily.   HYDROcodone-acetaminophen 5-325 MG tablet Commonly known as: NORCO/VICODIN Take 1 tablet by mouth every 4 (four) hours as needed for moderate pain. Replaces: HYDROcodone-acetaminophen 10-325 MG tablet   insulin lispro 100 UNIT/ML KwikPen Commonly known as: HumaLOG KwikPen Inject 15-20 Units into the skin 3 (three) times daily with meals.   insulin NPH-regular Human (70-30) 100 UNIT/ML injection Inject 60 Units into the skin 2 (two) times daily with a meal.   isosorbide mononitrate 30 MG 24 hr tablet Commonly known as: IMDUR Take 1 tablet by mouth once daily   linaclotide 72 MCG capsule Commonly known as: Linzess Take 1 capsule (72 mcg total) by mouth daily as needed (constipation).   metoprolol succinate 50 MG 24 hr tablet Commonly known as: TOPROL-XL Take 1 tablet by mouth once daily   metoprolol tartrate 50 MG tablet Commonly known as: LOPRESSOR Take 50 mg by mouth daily as needed (a-fib).   nitroGLYCERIN 0.4 MG SL tablet Commonly known as: NITROSTAT DISSOLVE ONE TABLET UNDER THE TONGUE EVERY 5 MINUTES AS NEEDED FOR CHEST PAIN.  DO NOT EXCEED A TOTAL OF 3 DOSES IN 15 MINUTES   pantoprazole 40 MG tablet Commonly known as: PROTONIX Take 1 tablet by mouth once daily   triamcinolone 55 MCG/ACT Aero nasal inhaler Commonly known as: NASACORT Place 1 spray into the nose as needed (allergies).   vitamin B-12 1000 MCG tablet Commonly known as: CYANOCOBALAMIN Take 1,000 mcg by mouth daily.       ASK your doctor about these medications    Ozempic (1 MG/DOSE) 2 MG/1.5ML Sopn Generic drug: Semaglutide (1  MG/DOSE) Inject 0.75 mLs (1 mg total) into the skin once a week.        Discharge Instructions: [facility] Discharge instructions Lower Extremity Bypass Surgery  Please refer to the following instruction for your post-procedure care. Your surgeon or physician assistant will discuss any changes with you.  Activity  You are encouraged to walk as much as you can. You can slowly return to normal activities during the month after your surgery. Avoid strenuous activity and heavy lifting until your doctor tells you it's OK. Avoid activities such as vacuuming or swinging a golf club. Do not drive until your doctor give the OK and you are no longer taking prescription pain medications. It is also normal to have difficulty with sleep habits, eating and bowel movement after surgery. These will go away with time.  Bathing/Showering  You may shower after you go home. Do not soak in a bathtub, hot tub, or swim until the incision heals completely.  Incision Care  Clean your incision with mild soap and water. Shower every day. Pat the area dry with a clean towel. You do not need a bandage unless otherwise instructed. Do not apply any ointments or creams to your incision. If you have open wounds you will be instructed how to care for them or a visiting nurse may be arranged for you. If you have staples or sutures along your incision they will be removed at your post-op appointment. You may have skin glue on your incision. Do not peel it off. It will come off on its own in about one week.  Wash the groin wound with soap and water daily and pat dry. (No tub bath-only shower)  Then  put a dry gauze or washcloth in the groin to keep this area dry to help prevent wound infection.  Do this daily and as needed.  Do not use Vaseline or neosporin on your incisions.  Only use soap and water on your incisions and then protect and keep dry.  Diet  Resume your normal diet. There are no  special food restrictions following this procedure. A low fat/ low cholesterol diet is recommended for all patients with vascular disease. In order to heal from your surgery, it is CRITICAL to get adequate nutrition. Your body requires vitamins, minerals, and protein. Vegetables are the best source of vitamins and minerals. Vegetables also provide the perfect balance of protein. Processed food has little nutritional value, so try to avoid this.  Medications  Resume taking all your medications unless your doctor or Physician Assistant tells you not to. If your incision is causing pain, you may take over-the-counter pain relievers such as acetaminophen (Tylenol). If you were prescribed a stronger pain medication, please aware these medication can cause nausea and constipation. Prevent nausea by taking the medication with a snack or meal. Avoid constipation by drinking plenty of fluids and eating foods with high amount of fiber, such as fruits, vegetables, and grains. Take Colace 100 mg (an over-the-counter stool softener) twice a day as needed for constipation.  Do not take Tylenol if you are taking prescription pain medications.  Follow Up  Our office will schedule a follow up appointment 2-3 weeks following discharge.  Please call us immediately for any of the following conditions  Severe or worsening pain in your legs or feet while at rest or while walking Increase pain, redness, warmth, or drainage (pus) from your incision site(s) Fever of 101 degree or higher The swelling in your leg with the bypass suddenly worsens and becomes more painful than when you were in the hospital If you have been instructed to feel your graft pulse then you should do so every day. If you can no longer feel this pulse, call the office immediately. Not all patients are given this instruction.  Leg swelling is common after leg bypass surgery.  The swelling should improve over a few months following surgery. To improve  the swelling, you may elevate your legs above the level of your heart while you are sitting or resting. Your surgeon or physician assistant may ask you to apply an ACE wrap or wear compression (TED) stockings to help to reduce swelling.  Reduce your risk of vascular disease  Stop smoking. If you would like help call [facility] at 1-800-QUIT-NOW (830) 082-9473) or [place] at [phone].  Manage your cholesterol Maintain a desired weight Control your diabetes weight Control your diabetes Keep your blood pressure down  If you have any questions, please call the office at [phone]   Prescriptions given: 1.  Roxicet #20 No Refill   Disposition: Home with HH PT  Patient's condition: is Good  Follow up: 1. Dr. Trula [name] in 2 weeks   Risa Grill, PA-C [facility] [phone] 12/03/2020  8:15 AM  - For VQI Registry use ---   Post-op:  Wound infection: No  Graft infection: No  Transfusion: No    If yes,  units given New Arrhythmia: No Ipsilateral amputation: No, [ ]  Minor, [ ]  BKA, [ ]  AKA Discharge patency: [x ] Primary, [ ]  Primary assisted, [ ]  Secondary, [ ]  Occluded Patency judged by: [x ] Dopper only, [ ]  Palpable graft pulse, []  Palpable distal pulse, [ ]   ABI inc. > 0.15, [ ]  Duplex Discharge ABI: R , L  D/C Ambulatory Status: Ambulatory with Assistance  Complications: MI: No, [ ]  Troponin only, [ ]  EKG or Clinical CHF: No Resp failure:No, [ ]  Pneumonia, [ ]  Ventilator Chg in renal function: No, [ ]  Inc. Cr > 0.5, [ ]  Temp. Dialysis,  [ ]  Permanent dialysis Stroke: No, [ ]  Minor, [ ]  Major Return to OR: No  Reason for return to OR: [ ]  Bleeding, [ ]  Infection, [ ]  Thrombosis, [ ]  Revision  Discharge medications: Statin use:  no ASA use:  no Plavix use:  no Beta blocker use: yes CCB use:  No ACEI use:   no ARB use:  no Coumadin use: no apixaban

## 2020-12-03 NOTE — Telephone Encounter (Signed)
PT ordered.

## 2020-12-03 NOTE — Addendum Note (Signed)
Addended by: [name] A on: 12/03/2020 09:04 AM   Modules accepted: Orders

## 2020-12-03 NOTE — Telephone Encounter (Signed)
Just completed the Post End of Study phone call for the [employer] trail. Subject is doing good!! I've updated all the concomitant medications in the [facility] system and reported all new AE's or SAE's to sponsor already. Thanked subject for being apart of this study!!

## 2020-12-04 DIAGNOSIS — I2510 Atherosclerotic heart disease of native coronary artery without angina pectoris: Secondary | ICD-10-CM | POA: Diagnosis not present

## 2020-12-04 DIAGNOSIS — I739 Peripheral vascular disease, unspecified: Secondary | ICD-10-CM | POA: Diagnosis not present

## 2020-12-04 DIAGNOSIS — Z48812 Encounter for surgical aftercare following surgery on the circulatory system: Secondary | ICD-10-CM | POA: Diagnosis not present

## 2020-12-04 DIAGNOSIS — E119 Type 2 diabetes mellitus without complications: Secondary | ICD-10-CM | POA: Diagnosis not present

## 2020-12-04 DIAGNOSIS — Z95828 Presence of other vascular implants and grafts: Secondary | ICD-10-CM | POA: Diagnosis not present

## 2020-12-04 DIAGNOSIS — Z7984 Long term (current) use of oral hypoglycemic drugs: Secondary | ICD-10-CM | POA: Diagnosis not present

## 2020-12-04 DIAGNOSIS — Z794 Long term (current) use of insulin: Secondary | ICD-10-CM | POA: Diagnosis not present

## 2020-12-04 DIAGNOSIS — R2681 Unsteadiness on feet: Secondary | ICD-10-CM | POA: Diagnosis not present

## 2020-12-04 DIAGNOSIS — I4891 Unspecified atrial fibrillation: Secondary | ICD-10-CM | POA: Diagnosis not present

## 2020-12-05 ENCOUNTER — Other Ambulatory Visit: Payer: Self-pay

## 2020-12-05 ENCOUNTER — Ambulatory Visit: Payer: Medicare Other | Admitting: Cardiovascular Disease

## 2020-12-05 ENCOUNTER — Ambulatory Visit (INDEPENDENT_AMBULATORY_CARE_PROVIDER_SITE_OTHER): Payer: Medicare Other

## 2020-12-05 ENCOUNTER — Encounter: Payer: Self-pay | Admitting: Cardiovascular Disease

## 2020-12-05 VITALS — BP 166/98 | HR 70 | Ht 74.0 in | Wt 294.6 lb

## 2020-12-05 DIAGNOSIS — Z794 Long term (current) use of insulin: Secondary | ICD-10-CM | POA: Diagnosis not present

## 2020-12-05 DIAGNOSIS — Z79899 Other long term (current) drug therapy: Secondary | ICD-10-CM | POA: Diagnosis not present

## 2020-12-05 DIAGNOSIS — R2681 Unsteadiness on feet: Secondary | ICD-10-CM

## 2020-12-05 DIAGNOSIS — Z7984 Long term (current) use of oral hypoglycemic drugs: Secondary | ICD-10-CM | POA: Diagnosis not present

## 2020-12-05 DIAGNOSIS — I739 Peripheral vascular disease, unspecified: Secondary | ICD-10-CM

## 2020-12-05 DIAGNOSIS — I480 Paroxysmal atrial fibrillation: Secondary | ICD-10-CM

## 2020-12-05 DIAGNOSIS — I10 Essential (primary) hypertension: Secondary | ICD-10-CM

## 2020-12-05 DIAGNOSIS — I4891 Unspecified atrial fibrillation: Secondary | ICD-10-CM

## 2020-12-05 DIAGNOSIS — I2510 Atherosclerotic heart disease of native coronary artery without angina pectoris: Secondary | ICD-10-CM

## 2020-12-05 DIAGNOSIS — E119 Type 2 diabetes mellitus without complications: Secondary | ICD-10-CM | POA: Diagnosis not present

## 2020-12-05 DIAGNOSIS — Z95828 Presence of other vascular implants and grafts: Secondary | ICD-10-CM

## 2020-12-05 DIAGNOSIS — Z48812 Encounter for surgical aftercare following surgery on the circulatory system: Secondary | ICD-10-CM | POA: Diagnosis not present

## 2020-12-05 MED ORDER — FUROSEMIDE 20 MG PO TABS: ORAL_TABLET | ORAL | 3 refills | Status: DC

## 2020-12-05 NOTE — Patient Instructions (Signed)
Medication Instructions:  Your physician recommends that you continue on your current medications as directed. Please refer to the Current Medication list given to you today.  Take Lasix 20 mg two times a day for 7 days then decrease and take Daily   *If you need a refill on your cardiac medications before your next appointment, please call your pharmacy*   Lab Work: Your physician recommends that you return for lab work in: 3 Weeks   If you have labs (blood work) drawn today and your tests are completely normal, you will receive your results only by: [employer] (if you have MyChart) OR A paper copy in the mail If you have any lab test that is abnormal or we need to change your treatment, we will call you to review the results.   Testing/Procedures: NONE    Follow-Up: At [facility], you and your health needs are our priority.  As part of our continuing mission to provide you with exceptional heart care, we have created designated Provider Care Teams.  These Care Teams include your primary Cardiologist (physician) and Advanced Practice Providers (APPs -  Physician Assistants and Nurse Practitioners) who all work together to provide you with the care you need, when you need it.  We recommend signing up for the patient portal called "MyChart".  Sign up information is provided on this After Visit Summary.  MyChart is used to connect with patients for Virtual Visits (Telemedicine).  Patients are able to view lab/test results, encounter notes, upcoming appointments, etc.  Non-urgent messages can be sent to your provider as well.   To learn more about what you can do with MyChart, go to [email or web address].    Your next appointment:   6 month(s)  The format for your next appointment:   In Person  Provider:   Jenkins Rouge, MD   Other Instructions Thank you for choosing [place]!

## 2020-12-09 ENCOUNTER — Telehealth: Payer: Self-pay | Admitting: Surgery

## 2020-12-09 MED ORDER — HYDROCODONE-ACETAMINOPHEN 5-325 MG PO TABS: 1.0000 | ORAL_TABLET | Freq: Four times a day (QID) | ORAL | 0 refills | Status: DC | PRN

## 2020-12-09 NOTE — Telephone Encounter (Signed)
30 Norco tablets called in for post op pain

## 2020-12-10 ENCOUNTER — Other Ambulatory Visit: Payer: Self-pay | Admitting: Vascular Surgery

## 2020-12-12 ENCOUNTER — Telehealth: Payer: Self-pay | Admitting: Pharmacist

## 2020-12-12 DIAGNOSIS — I739 Peripheral vascular disease, unspecified: Secondary | ICD-10-CM | POA: Diagnosis not present

## 2020-12-12 DIAGNOSIS — Z7984 Long term (current) use of oral hypoglycemic drugs: Secondary | ICD-10-CM | POA: Diagnosis not present

## 2020-12-12 DIAGNOSIS — Z48812 Encounter for surgical aftercare following surgery on the circulatory system: Secondary | ICD-10-CM | POA: Diagnosis not present

## 2020-12-12 DIAGNOSIS — I4891 Unspecified atrial fibrillation: Secondary | ICD-10-CM | POA: Diagnosis not present

## 2020-12-12 DIAGNOSIS — Z95828 Presence of other vascular implants and grafts: Secondary | ICD-10-CM | POA: Diagnosis not present

## 2020-12-12 DIAGNOSIS — Z794 Long term (current) use of insulin: Secondary | ICD-10-CM | POA: Diagnosis not present

## 2020-12-12 DIAGNOSIS — E119 Type 2 diabetes mellitus without complications: Secondary | ICD-10-CM | POA: Diagnosis not present

## 2020-12-12 DIAGNOSIS — R2681 Unsteadiness on feet: Secondary | ICD-10-CM | POA: Diagnosis not present

## 2020-12-12 DIAGNOSIS — I2510 Atherosclerotic heart disease of native coronary artery without angina pectoris: Secondary | ICD-10-CM | POA: Diagnosis not present

## 2020-12-12 MED ORDER — EZETIMIBE 10 MG PO TABS: 10.0000 mg | ORAL_TABLET | Freq: Every day | ORAL | 3 refills | Status: DC

## 2020-12-12 NOTE — Telephone Encounter (Signed)
Lipids 11/24/20: TC 173, TG 308, HDL 25, LDL 86  Pt previously experienced myalgias with pravastatin, rosuvastatin, atorvastatin, pitavastatin, and Repatha. Stopped Zetia in 2017 due to cost. Anaphylaxis to shellfish so he cannot take fish oil products for his TG. Currently taking fenofibrate.   Called pt to discuss options. Nexletol and Nexlizet are not on his formulary. Could try ezetimibe (much cheaper now that it's generic) or Praluent. He's already in the donut hole from his other branded meds so Praluent is not an affordable option currently.  He is agreeable to start ezetimibe '10mg'$  daily and will continue on fenofibrate. Sees his PCP in October and will have them recheck his cholesterol.

## 2020-12-12 NOTE — Telephone Encounter (Signed)
Josue Hector, MD  Ramond Dial, RPH-CPP; Danija Gosa, Harlon Flor, RPH-CPP F/u lipid clinic consider nexlizet         Previous Messages    ----- Message -----  From: Hillary Bow, MD  Sent: 12/11/2020   2:49 PM EDT  To: Josue Hector, MD, [name], FNP   Dr [name] and Ms Lenna Gilford   Mr Halpert will complete his final phone call follow up this week for the CLEAR study.  He had his end of study physical exam recently and no longer is receiving investigational medication for the CLEAR study  (Bempedoic Acid vs. placebo).  I would encourage a repeat lipid, and then some decision made regarding further therapies.  He has had trouble with both REPATHA and statins. We will defer to your judgment.

## 2020-12-13 ENCOUNTER — Other Ambulatory Visit: Payer: Self-pay

## 2020-12-13 ENCOUNTER — Telehealth: Payer: Self-pay

## 2020-12-13 DIAGNOSIS — I70213 Atherosclerosis of native arteries of extremities with intermittent claudication, bilateral legs: Secondary | ICD-10-CM

## 2020-12-13 NOTE — Telephone Encounter (Signed)
Returned call and left voice mail to both [facility] nurse and pt to see how his incision was doing today. Per VM left yesterday on triage line, it was itchy.

## 2020-12-20 ENCOUNTER — Ambulatory Visit (INDEPENDENT_AMBULATORY_CARE_PROVIDER_SITE_OTHER)
Admission: RE | Admit: 2020-12-20 | Discharge: 2020-12-20 | Disposition: A | Discharge status: Home / Self Care | Payer: Medicare Other | Source: Ambulatory Visit | Attending: Vascular Surgery | Admitting: Vascular Surgery

## 2020-12-20 ENCOUNTER — Ambulatory Visit (HOSPITAL_COMMUNITY)
Admission: RE | Admit: 2020-12-20 | Discharge: 2020-12-20 | Disposition: A | Discharge status: Home / Self Care | Payer: Medicare Other | Source: Ambulatory Visit | Attending: Vascular Surgery | Admitting: Vascular Surgery

## 2020-12-20 ENCOUNTER — Ambulatory Visit (INDEPENDENT_AMBULATORY_CARE_PROVIDER_SITE_OTHER): Payer: Medicare Other | Admitting: Physician Assistant

## 2020-12-20 ENCOUNTER — Other Ambulatory Visit: Payer: Self-pay

## 2020-12-20 VITALS — BP 138/69 | HR 77 | Temp 98.0°F | Resp 20 | Ht 74.0 in | Wt 297.3 lb

## 2020-12-20 DIAGNOSIS — I70213 Atherosclerosis of native arteries of extremities with intermittent claudication, bilateral legs: Secondary | ICD-10-CM

## 2020-12-20 DIAGNOSIS — I7025 Atherosclerosis of native arteries of other extremities with ulceration: Secondary | ICD-10-CM

## 2020-12-20 DIAGNOSIS — I739 Peripheral vascular disease, unspecified: Secondary | ICD-10-CM

## 2020-12-20 NOTE — Progress Notes (Signed)
POST OPERATIVE OFFICE NOTE    CC:  F/u for surgery  HPI:  This is a 70 y.o. male who is s/p left common femoral to above knee popliteal artery bypass graft with reversed ipsilateral translocated saphenous vein, right lower extremity angiogram and right SFA stenting by Dr. [name] on 11/22/20. This was performed secondary to bilateral lower extremity foot wounds. He had previously undergone stenting of his right superficial femoral artery in November of 2020. Dr. [name] was unable to recanalize the left SFA at that time. At the time he was just having claudication symptoms which were tolerable. However at follow up he was noted to have new foot ulceration and so surgical bypass was recommended. He underwent initially second attempt at endovascular crossing the left SFA leasion on 6/28 but this was unsuccessful.   He did well post operatively. Had some unusual upper extremity symptoms and Neurology was initially consulted. MRA of head and neck was performed showing no acute findings. He refused CTA of head and neck. His symptoms subsequently resolved. His legs remained well perfused. He progressed well with mobility and pain control and was discharged POD #3. He was discharged with Home health PT sevices arranged  Today he reports no rest pain or claudication pain. He still has wounds on both lower extremities. He is also concerned about his above knee popliteal incision. Some redness and clear- yellow drainage since discharge from hospital. Has been cleaning it daily with soap and water. He reports swelling of left leg which was much worse when he initially got home but now better. He feels that is when his incision really opened up. He had PT but after first session did not feel that he needed it any more. He says he has been active and is able to ambulate and get around as he needs with cane. He is scheduled to start outpatient PT though for cardiac rehab.   He has history of coronary artery disease.   He underwent DES in 2010 in 2018.  He suffers from atrial fibrillation.  He is on Eliquis and has a loop recorder implanted.  He is statin and Repatha intolerant.  He is currently involved in a research study to address his hypercholesterolemia.  He is a diabetic  Allergies  Allergen Reactions   Shellfish Allergy Anaphylaxis and Other (See Comments)    Tongue swelling, hives    Sulfa Antibiotics Anaphylaxis and Rash    Tongue swelling, hives   Ace Inhibitors Other (See Comments) and Cough    CKD, renal failure    Invokana [Canagliflozin] Other (See Comments)    Syncope / dehydration   Lisinopril Cough   Metformin And Related Itching   Pravastatin Sodium Other (See Comments)    myalgias   Milk-Related Compounds     Ties stomach in knots    Crestor [Rosuvastatin] Other (See Comments)    Myalgias    Fenofibrate Other (See Comments)    Body aches - pt currently taking isnt sure if its causing any pain   Horse-Derived Products Rash   Iodine Other (See Comments)    ????. PATIENT SAYS HE DID FINE WITH LAST CT WITH CONTRAST** patient had IV contrast on 06/21/2018 without pre meds without any issues. Patient had allergic reaction to Shellfish 30 years ago-not to IV contrast.    Lexapro [Escitalopram Oxalate] Other (See Comments)    Buzzing in ears,headache, felt like a zombie   Lipitor [Atorvastatin] Other (See Comments)    myalgias   Livalo [Pitavastatin]  Other (See Comments)    Myalgias    Repatha [Evolocumab] Other (See Comments)    Myalgias, flu like sx   Tape Rash    Current Outpatient Medications  Medication Sig Dispense Refill   albuterol (VENTOLIN HFA) 108 (90 Base) MCG/ACT inhaler Inhale 2 puffs into the lungs every 6 (six) hours as needed for wheezing or shortness of breath. 8.5 g 3   apixaban (ELIQUIS) 5 MG TABS tablet Take 1 tablet (5 mg total) by mouth 2 (two) times daily. 180 tablet 1   Chlorphen-Phenyleph-ASA (ALKA-SELTZER PLUS COLD PO) Take 2 tablets by mouth daily as  needed (allergies).     clopidogrel (PLAVIX) 75 MG tablet Take 1 tablet by mouth once daily 90 tablet 0   dapagliflozin propanediol (FARXIGA) 10 MG TABS tablet Take 1 tablet (10 mg total) by mouth daily before breakfast. 90 tablet 3   DULoxetine (CYMBALTA) 30 MG capsule Take 1 capsule by mouth once daily 90 capsule 0   ezetimibe (ZETIA) 10 MG tablet Take 1 tablet (10 mg total) by mouth daily. 90 tablet 3   fenofibrate 160 MG tablet TAKE 1 TABLET BY MOUTH ONCE DAILY FOR CHOLESTEROL AND TRIGLYCERIDES 90 tablet 3   furosemide (LASIX) 20 MG tablet Take Two Times Daily for 7 Days then take Daily 97 tablet 3   gabapentin (NEURONTIN) 300 MG capsule Take 3 capsules (900 mg total) by mouth 3 (three) times daily. 810 capsule 3   HYDROcodone-acetaminophen (NORCO) 5-325 MG tablet Take 1 tablet by mouth every 6 (six) hours as needed for moderate pain. 30 tablet 0   insulin lispro (HUMALOG KWIKPEN) 100 UNIT/ML KwikPen Inject 15-20 Units into the skin 3 (three) times daily with meals. 15 mL 11   insulin NPH-regular Human (70-30) 100 UNIT/ML injection Inject 60 Units into the skin 2 (two) times daily with a meal.     isosorbide mononitrate (IMDUR) 30 MG 24 hr tablet Take 1 tablet by mouth once daily 90 tablet 0   linaclotide (LINZESS) 72 MCG capsule Take 1 capsule (72 mcg total) by mouth daily as needed (constipation). 90 capsule 3   Menthol, Topical Analgesic, (BLUE-EMU MAXIMUM STRENGTH EX) Apply 1 application topically 2 (two) times daily.     metoprolol succinate (TOPROL-XL) 50 MG 24 hr tablet Take 1 tablet by mouth once daily 90 tablet 3   metoprolol tartrate (LOPRESSOR) 50 MG tablet Take 50 mg by mouth daily as needed (a-fib).     nitroGLYCERIN (NITROSTAT) 0.4 MG SL tablet DISSOLVE ONE TABLET UNDER THE TONGUE EVERY 5 MINUTES AS NEEDED FOR CHEST PAIN.  DO NOT EXCEED A TOTAL OF 3 DOSES IN 15 MINUTES 25 tablet 0   pantoprazole (PROTONIX) 40 MG tablet Take 1 tablet by mouth once daily 90 tablet 1   Semaglutide, 1  MG/DOSE, (OZEMPIC, 1 MG/DOSE,) 2 MG/1.5ML SOPN Inject 0.75 mLs (1 mg total) into the skin once a week. 12 mL 4   triamcinolone (NASACORT) 55 MCG/ACT AERO nasal inhaler Place 1 spray into the nose as needed (allergies).     vitamin B-12 (CYANOCOBALAMIN) 1000 MCG tablet Take 1,000 mcg by mouth daily.     No current facility-administered medications for this visit.     ROS:  See HPI  Physical Exam:  Vitals:   12/20/20 1057  BP: 138/69  Pulse: 77  Resp: 20  Temp: 98 F (36.7 C)  TempSrc: Temporal  SpO2: 96%  Weight: 297 lb 4.8 oz (134.9 kg)  Height: '6\' 2"'$  (1.88 m)  Genera: well appearing, well nourished, not in any acute distress Incision:  left groin and saphenectomy sites healing well. Left AKA pop incisions with some mild separation. Mild erythema and serous drainage present. No drainage on compression. No overt signs of infection.   Extremities:  Femoral pulses bilaterally, no palpable distal pulses. Bilateral foot wounds as shown below  Left foot with dorsal wound, stable dry eschar. Has lost several nails with eschars present in nail bed  Right great toe with .5cm x 1 cm wound. Scant drainage but non increased on compression. No surrounding erythema  Right lateral malleolus with ulcer and two black spots on lateral 4th and 5th toes of right foot Neuro: alert and oriented Abdomen:  obese, soft, non tender  Non invasive vascular lab: 12/20/20 -------+-----------+-----------+------------+------------+  ABI/TBIToday's ABIToday's TBIPrevious ABIPrevious TBI  +-------+-----------+-----------+------------+------------+  Right  0.57       0.36       0.79        0.54          +-------+-----------+-----------+------------+------------+  Left   0.66       0.51       0.68        0.67          +-------+-----------+-----------+------------+------------+     R great toe pressure: 48 mmHg L great toe pressure: 63  mmHg  +-----------+--------+-----+--------+----------+--------+  RIGHT      PSV cm/sRatioStenosisWaveform  Comments  +-----------+--------+-----+--------+----------+--------+  CFA Distal 107                  triphasic           +-----------+--------+-----+--------+----------+--------+  DFA        124                  biphasic            +-----------+--------+-----+--------+----------+--------+  SFA Prox   142                  monophasic          +-----------+--------+-----+--------+----------+--------+  SFA Mid                                   stent     +-----------+--------+-----+--------+----------+--------+  SFA Distal                                stent     +-----------+--------+-----+--------+----------+--------+  POP Prox   68                   monophasic          +-----------+--------+-----+--------+----------+--------+  POP Distal 37                   monophasic          +-----------+--------+-----+--------+----------+--------+  ATA Distal 0            occluded                    +-----------+--------+-----+--------+----------+--------+  PTA Distal 0            occluded                    +-----------+--------+-----+--------+----------+--------+  PERO Distal24                   monophasic          +-----------+--------+-----+--------+----------+--------+  Right Stent(s):  +--------------------------+--------+--------+----------+--------+  Prox/mid SFA to distal SFAPSV cm/sStenosisWaveform  Comments  +--------------------------+--------+--------+----------+--------+  Prox to Stent             138             monophasic          +--------------------------+--------+--------+----------+--------+  Proximal Stent            76              monophasic          +--------------------------+--------+--------+----------+--------+  Mid Stent                 51              biphasic             +--------------------------+--------+--------+----------+--------+  Distal Stent              53              monophasic          +--------------------------+--------+--------+----------+--------+  Distal to Stent           148             monophasic          +--------------------------+--------+--------+----------+--------+    Left Graft #1: fem-AK pop  +--------------------+--------+--------+----------+--------+                      PSV cm/sStenosisWaveform  Comments  +--------------------+--------+--------+----------+--------+  Inflow              138             biphasic            +--------------------+--------+--------+----------+--------+  Proximal Anastomosis148             monophasic          +--------------------+--------+--------+----------+--------+  Proximal Graft      89              monophasic          +--------------------+--------+--------+----------+--------+  Mid Graft           141             monophasic          +--------------------+--------+--------+----------+--------+  Distal Graft        95              monophasic          +--------------------+--------+--------+----------+--------+  Distal Anastomosis  141             monophasic          +--------------------+--------+--------+----------+--------+  Outflow             108             monophasic          +--------------------+--------+--------+----------+--------+      Summary:  Right: Patent SFA stent. Distal PTA and ATA appear occluded at the ankle.   Left: Patent fem-AK popliteal bypass graft with no stenosis seen.      Assessment/Plan:  This is a 70 y.o. male who is s/p left common femoral to above knee popliteal artery bypass graft with reversed ipsilateral translocated saphenous vein, right lower extremity angiogram and right SFA stenting by Dr. [name] on 11/22/20. This was performed secondary to bilateral lower extremity foot  wounds. Overall doing well post op. He has some separation of the AK pop incision on the left.  No overt infection. Discussed proper wound care with mild soap and water. Duplex today shows patent R SFA stent and patent LLE bypass graft. Monophasic and biphasic flow in both lower extremities. ABIs did not change much post intervention. He does have known single vessel peroneal runoff bilaterally - Advised him to keep feet protected - Continue Plavix and Zetia - Advised he and his wife to follow up earlier if they have concerns about worsening appearance of incisions/wounds -Will have him return in 2 weeks. No openings available in [facility] clinic so will have him follow up with Dr. Marcell Anger, PA-C [facility] [phone]  Clinic MD:  Donzetta Matters

## 2020-12-23 ENCOUNTER — Ambulatory Visit: Payer: Medicare Other | Attending: Family | Admitting: Physical Therapy

## 2020-12-23 ENCOUNTER — Other Ambulatory Visit: Payer: Self-pay

## 2020-12-23 DIAGNOSIS — R2681 Unsteadiness on feet: Secondary | ICD-10-CM | POA: Insufficient documentation

## 2020-12-23 NOTE — Therapy (Signed)
[facility] [place], [name], [address] Phone: [phone]   Fax:  [phone]  Physical Therapy Evaluation  Patient Details  Name: [name] [name] MRN: [id number] Date of Birth: [birth year] Referring Provider (PT): [name]   Encounter Date: 12/23/2020   PT End of Session - 12/23/20 1232     Visit Number 1    Number of Visits 12    Date for PT Re-Evaluation 03/23/21    PT Start Time 0910    PT Stop Time 0945    PT Time Calculation (min) 35 min    Activity Tolerance Patient tolerated treatment well    Behavior During Therapy [facility] for tasks assessed/performed             Past Medical History:  Diagnosis Date   Anxiety    Arthritis    Atrial fibrillation ([place])    CAD (coronary artery disease)    a. 2010: DES to CTO of RCA. EF 55% b. 07/2016: cath showing total occlusion within previously placed RCA stent (collaterals present), severe stenosis along LCx and OM1 (treated with 2 overlapping DES). c. repeat cath in 01/2018 showing patent stents along LCx and OM with CTO of D2, CTO of distal LCx, and CTO of RCA with collaterals present overall unchanged since 2018 with medical management recom   Cellulitis and abscess rt groin    Complication of anesthesia    " I woke up during a colonoscopy "      Depression    Diabetes mellitus    Diastolic CHF ([place])    Disorders of iron metabolism    Dysrhythmia    GERD (gastroesophageal reflux disease)    Hyperlipidemia    Hypertension    Low serum testosterone level    Medically noncompliant    Myocardial infarction [facility])     Past Surgical History:  Procedure Laterality Date   ABDOMINAL AORTOGRAM W/LOWER EXTREMITY N/A 03/21/2019   Procedure: ABDOMINAL AORTOGRAM W/LOWER EXTREMITY;  Surgeon: [name], MD;  Location: [place] [facility];  Service: Cardiovascular;  Laterality: N/A;   ABDOMINAL AORTOGRAM W/LOWER EXTREMITY N/A 11/12/2020   Procedure: ABDOMINAL AORTOGRAM  W/LOWER EXTREMITY;  Surgeon: [name], MD;  Location: [place] [facility];  Service: Cardiovascular;  Laterality: N/A;   BACK SURGERY  2015   ACDF by Dr. [name]   COLONOSCOPY N/A 10/01/2014   Dr. [name]: multiple tubular adenomas removed, colonic diverticulosis, redundant colon. next tcs advised for 09/2017. PATIENT NEEDS PROPOFOL FOR FAILED CONSCIOUS SEDATION   CORONARY STENT INTERVENTION N/A 07/30/2016   Procedure: Coronary Stent Intervention;  Surgeon: [name], MD;  Location: [place] [facility];  Service: Cardiovascular;  Laterality: N/A;   CORONARY STENT PLACEMENT  2000   By Dr. Olevia Perches   EP IMPLANTABLE DEVICE N/A 05/25/2016   Procedure: Loop Recorder Insertion;  Surgeon: [name], MD;  Location: [place] [facility];  Service: Cardiovascular;  Laterality: N/A;   ESOPHAGOGASTRODUODENOSCOPY     esophagus stretched remotely at [facility]   ESOPHAGOGASTRODUODENOSCOPY N/A 10/01/2014   Dr. [name]: patchy mottling/erythema and minimal polypoid appearance of gastric mucosa. bx with mild inlammation but no H.pylori   FEMORAL-POPLITEAL BYPASS GRAFT Left 11/22/2020   Procedure: LEFT FEMORAL-POPLITEAL BYPASS GRAFT;  Surgeon: [name], MD;  [facility];  Service: Vascular;  Laterality: Left;   HERNIA REPAIR  [id number]   umbilical   INSERTION OF ILIAC STENT Right 11/22/2020   Procedure: INSERTION OF ELUVIA STENT INTO RIGHT DISTAL  SUPERFICIAL FEMORAL ARTERY;  Surgeon: [name], MD;  Location: [place];  Service: Vascular;  Laterality: Right;   LEFT HEART CATH AND CORONARY ANGIOGRAPHY N/A 07/30/2016   Procedure: Left Heart Cath and Coronary Angiography;  Surgeon: [name], MD;  Location: [place] [facility];  Service: Cardiovascular;  Laterality: N/A;   LEFT HEART CATH AND CORONARY ANGIOGRAPHY N/A 01/19/2018   Procedure: LEFT HEART CATH AND CORONARY ANGIOGRAPHY;  Surgeon: [name], MD;  Location: [place] [facility];  Service: Cardiovascular;  Laterality: N/A;   LEFT HEART CATH AND  CORONARY ANGIOGRAPHY N/A 05/24/2020   Procedure: LEFT HEART CATH AND CORONARY ANGIOGRAPHY;  Surgeon: Burnell Blanks, MD;  Location: [place] [facility];  Service: Cardiovascular;  Laterality: N/A;   LESION REMOVAL     Lip and hand    LOWER EXTREMITY ANGIOGRAM Right 11/22/2020   Procedure: RIGHT LEG ANGIOGRAM;  Surgeon: [name], MD;  [facility];  Service: Vascular;  Laterality: Right;   LOWER EXTREMITY ANGIOGRAPHY N/A 04/18/2019   Procedure: LOWER EXTREMITY ANGIOGRAPHY;  Surgeon: [name], MD;  Location: [place] [facility];  Service: Cardiovascular;  Laterality: N/A;   NECK SURGERY     PERIPHERAL VASCULAR BALLOON ANGIOPLASTY  04/18/2019   Procedure: PERIPHERAL VASCULAR BALLOON ANGIOPLASTY;  Surgeon: [name], MD;  Location: [place] [facility];  Service: Cardiovascular;;   PERIPHERAL VASCULAR BALLOON ANGIOPLASTY Left 11/12/2020   Procedure: PERIPHERAL VASCULAR BALLOON ANGIOPLASTY;  Surgeon: [name], MD;  Location: [place] [facility];  Service: Cardiovascular;  Laterality: Left;  Failed PTA of superficial femoral artery.   PERIPHERAL VASCULAR INTERVENTION Right 03/21/2019   Procedure: PERIPHERAL VASCULAR INTERVENTION;  Surgeon: [name], MD;  Location: [place] [facility];  Service: Cardiovascular;  Laterality: Right;  superficial femoral    There were no vitals filed for this visit.    Subjective Assessment - 12/23/20 1235     Subjective COVID-19 screen performed prior to patient entering clinic.  The patient presents to the clinic today with a diagnosis of physical deconditioning.  His wife was present with him.  Her recently underwent a left Femoral-Popliteal bypass graft on 11/22/20.  He wishes to rebuild some conditioning and be able to get out and fish again.  Her reports several falls over the last 6 months and now uses a cane for safety though he did not bring it today.  Highly recommended he use it at all times for safety.    Patient is  accompained by: Family member    Pertinent History Adhesive tape and latex allergy, HTN, CAD, DM, stent placement, loop recoreder, OA, h/o spinal pain, MI, atrial fib    How long can you sit comfortably? Unlimited.    How long can you stand comfortably? Varies.    How long can you walk comfortably? Short community distance.    Patient Stated Goals Get out more and fish.                [facility] PT Assessment - 12/23/20 0001       Assessment   Medical Diagnosis Physical deconditioning.    Referring Provider (PT) [name]    Onset Date/Surgical Date --   Ongoing.     Precautions   Precautions Fall      Restrictions   Weight Bearing Restrictions No      Balance Screen   Has the patient fallen in the past 6 months Yes    How  many times? "Too many."    Has the patient had a decrease in activity level because of a fear of falling?  Yes    Is the patient reluctant to leave their home because of a fear of falling?  Yes      [place] residence      Prior Function   Level of Independence Independent      Posture/Postural Control   Posture/Postural Control Postural limitations    Postural Limitations Rounded Shoulders;Forward head    Posture Comments Bilateral genu Varum.  Right ankle is everted.      ROM / Strength   AROM / PROM / Strength AROM;Strength      AROM   Overall AROM Comments Bilateral U/LE AROM is WFL.      Strength   Overall Strength Comments Patient able to provide near normal strength grades for major muscles of U and LE's via [place].      Special Tests   Other special tests Unstable with Romberg testing.      Bed Mobility   Bed Mobility Rolling Left;Left Sidelying to Sit;Supine to Sit;Sit to Supine    Rolling Left Supervision/Verbal cueing    Left Sidelying to Sit Supervision/Verbal cueing    Supine to Sit Supervision/Verbal cueing    Sit to Supine Supervision/Verbal cueing      Transfers   Transfers Sit to  Stand    Sit to Stand 5: Supervision   Armrest required.     Ambulation/Gait   Ambulation/Gait Yes    Gait Pattern Decreased stride length    Gait Comments The patient's gait is slow and purposeful.  He walks with knees in genu varum and his right ankle inverted.  Highly recommended he use his cane at all times.                        Objective measurements completed on examination: See above findings.                    PT Long Term Goals - 12/23/20 1254       PT LONG TERM GOAL #1   Title Independent with a HEP.    Time 6    Period Weeks    Status New      PT LONG TERM GOAL #2   Title Negative Romberg test.    Time 6    Period Weeks    Status New      PT LONG TERM GOAL #3   Title Perform a reciprocating stair gait with one railing.    Time 6    Period Weeks    Status New      PT LONG TERM GOAL #4   Title Walk 500 feet on uneven terrain (ie:  lawn) without rest and without LOB.    Time 6    Period Weeks    Status New                    Plan - 12/23/20 1249     Clinical Impression Statement The patient presents to OPPt with a diagnosis of physical deconditioning.  He recently underwent a femoral-popliteal bypass graft surgery on 11/22/20.  He wanst to become more mobile and get out to fish again.  He has had mutiple falls over the last several months and it is recommended he use his cane at all times for safety.  His gait is  slow and purposeful.  He demonstrates a positive Romberg test.  His U/LE AROM is WFL.Patient will benefit from skilled physical therapy intervention to address deficits.    Personal Factors and Comorbidities Comorbidity 1;Comorbidity 2    Comorbidities Adhesive tape and latex allergy, HTN, CAD, DM, stent placement, loop recoreder, OA, h/o spinal pain, MI, atrial fib    Examination-Activity Limitations Other;Locomotion Level    Examination-Participation Restrictions Other;Yard Work    Teacher, adult education Low    Rehab Potential Good    PT Duration 6 weeks    PT Treatment/Interventions Gait training;Stair training;Functional mobility training;Therapeutic activities;Therapeutic exercise;Balance training;Neuromuscular re-education;Manual techniques;Patient/family education    PT Next Visit Plan Gait and balance training.  General conditioning.    Consulted and Agree with Plan of Care Patient             Patient will benefit from skilled therapeutic intervention in order to improve the following deficits and impairments:  Abnormal gait, Decreased activity tolerance, Decreased balance, Decreased mobility, Postural dysfunction, Other (comment)  Visit Diagnosis: Unsteadiness on feet - Plan: PT plan of care cert/re-cert     Problem List Patient Active Problem List   Diagnosis Date Noted   S/P femoral-popliteal bypass surgery 11/22/2020   PAOD (peripheral arterial occlusive disease) ([place]) 11/22/2020   Non-ST elevation (NSTEMI) myocardial infarction ([place]) 05/23/2020   Orthostatic hypotension 01/01/2020   Statin myopathy 12/06/2019   Diarrhea 05/10/2018   PAF (paroxysmal atrial fibrillation) ([place]) 03/29/2018   Lacunar infarction ([place]) 03/29/2018   Unstable angina ([place])    Chest pain 01/18/2018   CHF (congestive heart failure) ([place]) 10/21/2017   Anxiety 03/09/2017   Constipation 11/26/2016   Diabetes ([place]) 05/25/2016   Occlusion and stenosis of vertebral artery    Acute renal failure ([place]) 05/23/2016   Diabetic neuropathy ([place]) 01/13/2016   Depression 11/01/2015   Erectile dysfunction 01/23/2015   Morbid obesity ([place]) 01/23/2015   Periodontal disease 01/23/2015   Stopped smoking with greater than 30 pack year history 01/23/2015   Diabetic peripheral neuropathy associated with type 2 diabetes mellitus ([place]) 11/08/2014   Mucosal abnormality of stomach    History of colonic polyps    Abdominal pain 09/06/2014   GERD  (gastroesophageal reflux disease) 09/06/2014   Low serum testosterone level 08/01/2014   DDD (degenerative disc disease), cervical 12/20/2013   Headache 08/07/2013   Medically noncompliant 10/25/2011   CAD S/P percutaneous coronary angioplasty 10/25/2011   MURMUR 04/16/2009   Hypertension associated with diabetes ([place]) 08/02/2008   Hyperlipidemia associated with type 2 diabetes mellitus ([place]) 07/31/2008   DISORDERS OF IRON METABOLISM 07/31/2008    Londen Bok, [place] MPT 12/23/2020, 12:56 PM  [facility] [address] [place], [name], [address] Phone: [phone]   Fax:  [phone]  Name: GUSTAV FIEBIGER MRN: [id number] Date of Birth: 13-Dec-1950

## 2020-12-27 ENCOUNTER — Other Ambulatory Visit: Payer: Medicare Other

## 2020-12-27 ENCOUNTER — Other Ambulatory Visit: Payer: Self-pay

## 2020-12-27 DIAGNOSIS — I129 Hypertensive chronic kidney disease with stage 1 through stage 4 chronic kidney disease, or unspecified chronic kidney disease: Secondary | ICD-10-CM | POA: Diagnosis not present

## 2020-12-27 DIAGNOSIS — Z79899 Other long term (current) drug therapy: Secondary | ICD-10-CM | POA: Diagnosis not present

## 2020-12-27 DIAGNOSIS — E1129 Type 2 diabetes mellitus with other diabetic kidney complication: Secondary | ICD-10-CM | POA: Diagnosis not present

## 2020-12-27 DIAGNOSIS — R809 Proteinuria, unspecified: Secondary | ICD-10-CM | POA: Diagnosis not present

## 2020-12-27 DIAGNOSIS — N189 Chronic kidney disease, unspecified: Secondary | ICD-10-CM | POA: Diagnosis not present

## 2020-12-27 DIAGNOSIS — E1122 Type 2 diabetes mellitus with diabetic chronic kidney disease: Secondary | ICD-10-CM | POA: Diagnosis not present

## 2020-12-28 LAB — BASIC METABOLIC PANEL
BUN/Creatinine Ratio: 15 (ref 10–24)
BUN: 25 mg/dL (ref 8–27)
CO2: 23 mmol/L (ref 20–29)
Calcium: 10.1 mg/dL (ref 8.6–10.2)
Chloride: 94 mmol/L — ABNORMAL LOW (ref 96–106)
Creatinine, Ser: 1.63 mg/dL — ABNORMAL HIGH (ref 0.76–1.27)
Glucose: 262 mg/dL — ABNORMAL HIGH (ref 65–99)
Potassium: 4.3 mmol/L (ref 3.5–5.2)
Sodium: 136 mmol/L (ref 134–144)
eGFR: 45 mL/min/{1.73_m2} — ABNORMAL LOW (ref >59)

## 2020-12-30 ENCOUNTER — Telehealth: Payer: Self-pay

## 2020-12-30 ENCOUNTER — Ambulatory Visit: Payer: Medicare Other | Admitting: Surgery

## 2020-12-30 DIAGNOSIS — Z79899 Other long term (current) drug therapy: Secondary | ICD-10-CM

## 2020-12-30 NOTE — Telephone Encounter (Signed)
Patient confirmed that his lasix dose is 20 mg daily. He will repeat bmet in 8 weeks at [facility]

## 2020-12-31 ENCOUNTER — Other Ambulatory Visit: Payer: Self-pay

## 2020-12-31 ENCOUNTER — Ambulatory Visit: Payer: Medicare Other | Admitting: *Deleted

## 2020-12-31 DIAGNOSIS — R2681 Unsteadiness on feet: Secondary | ICD-10-CM

## 2020-12-31 NOTE — Therapy (Signed)
[facility] [place], [name], [address] Phone: [phone]   Fax:  [phone]  Physical Therapy Treatment  Patient Details  Name: [name] [name] MRN: [id number] Date of Birth: [birth year] Referring Provider (PT): [name]   Encounter Date: 12/31/2020   PT End of Session - 12/31/20 1001     Visit Number 2    Number of Visits 12    Date for PT Re-Evaluation 03/23/21    PT Start Time 0900    PT Stop Time 0946    PT Time Calculation (min) 46 min             Past Medical History:  Diagnosis Date   Anxiety    Arthritis    Atrial fibrillation ([place])    CAD (coronary artery disease)    a. 2010: DES to CTO of RCA. EF 55% b. 07/2016: cath showing total occlusion within previously placed RCA stent (collaterals present), severe stenosis along LCx and OM1 (treated with 2 overlapping DES). c. repeat cath in 01/2018 showing patent stents along LCx and OM with CTO of D2, CTO of distal LCx, and CTO of RCA with collaterals present overall unchanged since 2018 with medical management recom   Cellulitis and abscess rt groin    Complication of anesthesia    " I woke up during a colonoscopy "      Depression    Diabetes mellitus    Diastolic CHF ([place])    Disorders of iron metabolism    Dysrhythmia    GERD (gastroesophageal reflux disease)    Hyperlipidemia    Hypertension    Low serum testosterone level    Medically noncompliant    Myocardial infarction [facility])     Past Surgical History:  Procedure Laterality Date   ABDOMINAL AORTOGRAM W/LOWER EXTREMITY N/A 03/21/2019   Procedure: ABDOMINAL AORTOGRAM W/LOWER EXTREMITY;  Surgeon: [name], MD;  Location: [place] [facility];  Service: Cardiovascular;  Laterality: N/A;   ABDOMINAL AORTOGRAM W/LOWER EXTREMITY N/A 11/12/2020   Procedure: ABDOMINAL AORTOGRAM W/LOWER EXTREMITY;  Surgeon: [name], MD;  Location: [place] [facility];  Service: Cardiovascular;   Laterality: N/A;   BACK SURGERY  2015   ACDF by Dr. [name]   COLONOSCOPY N/A 10/01/2014   Dr. [name]: multiple tubular adenomas removed, colonic diverticulosis, redundant colon. next tcs advised for 09/2017. PATIENT NEEDS PROPOFOL FOR FAILED CONSCIOUS SEDATION   CORONARY STENT INTERVENTION N/A 07/30/2016   Procedure: Coronary Stent Intervention;  Surgeon: [name], MD;  Location: [place] [facility];  Service: Cardiovascular;  Laterality: N/A;   CORONARY STENT PLACEMENT  2000   By Dr. Olevia Perches   EP IMPLANTABLE DEVICE N/A 05/25/2016   Procedure: Loop Recorder Insertion;  Surgeon: [name], MD;  Location: [place] [facility];  Service: Cardiovascular;  Laterality: N/A;   ESOPHAGOGASTRODUODENOSCOPY     esophagus stretched remotely at [facility]   ESOPHAGOGASTRODUODENOSCOPY N/A 10/01/2014   Dr. [name]: patchy mottling/erythema and minimal polypoid appearance of gastric mucosa. bx with mild inlammation but no H.pylori   FEMORAL-POPLITEAL BYPASS GRAFT Left 11/22/2020   Procedure: LEFT FEMORAL-POPLITEAL BYPASS GRAFT;  Surgeon: [name], MD;  [facility];  Service: Vascular;  Laterality: Left;   HERNIA REPAIR  [id number]   umbilical   INSERTION OF ILIAC STENT Right 11/22/2020   Procedure: INSERTION OF ELUVIA STENT INTO RIGHT DISTAL SUPERFICIAL FEMORAL ARTERY;  Surgeon: [name], MD;  Location: [place];  Service: Vascular;  Laterality: Right;  LEFT HEART CATH AND CORONARY ANGIOGRAPHY N/A 07/30/2016   Procedure: Left Heart Cath and Coronary Angiography;  Surgeon: [name], MD;  Location: [place] [facility];  Service: Cardiovascular;  Laterality: N/A;   LEFT HEART CATH AND CORONARY ANGIOGRAPHY N/A 01/19/2018   Procedure: LEFT HEART CATH AND CORONARY ANGIOGRAPHY;  Surgeon: [name], MD;  Location: [place] [facility];  Service: Cardiovascular;  Laterality: N/A;   LEFT HEART CATH AND CORONARY ANGIOGRAPHY N/A 05/24/2020   Procedure: LEFT HEART CATH AND CORONARY ANGIOGRAPHY;  Surgeon: Burnell Blanks, MD;  Location: [place] [facility];  Service: Cardiovascular;  Laterality: N/A;   LESION REMOVAL     Lip and hand    LOWER EXTREMITY ANGIOGRAM Right 11/22/2020   Procedure: RIGHT LEG ANGIOGRAM;  Surgeon: [name], MD;  [facility];  Service: Vascular;  Laterality: Right;   LOWER EXTREMITY ANGIOGRAPHY N/A 04/18/2019   Procedure: LOWER EXTREMITY ANGIOGRAPHY;  Surgeon: [name], MD;  Location: [place] [facility];  Service: Cardiovascular;  Laterality: N/A;   NECK SURGERY     PERIPHERAL VASCULAR BALLOON ANGIOPLASTY  04/18/2019   Procedure: PERIPHERAL VASCULAR BALLOON ANGIOPLASTY;  Surgeon: [name], MD;  Location: [place] [facility];  Service: Cardiovascular;;   PERIPHERAL VASCULAR BALLOON ANGIOPLASTY Left 11/12/2020   Procedure: PERIPHERAL VASCULAR BALLOON ANGIOPLASTY;  Surgeon: [name], MD;  Location: [place] [facility];  Service: Cardiovascular;  Laterality: Left;  Failed PTA of superficial femoral artery.   PERIPHERAL VASCULAR INTERVENTION Right 03/21/2019   Procedure: PERIPHERAL VASCULAR INTERVENTION;  Surgeon: [name], MD;  Location: [place] [facility];  Service: Cardiovascular;  Laterality: Right;  superficial femoral    There were no vitals filed for this visit.   Subjective Assessment - 12/31/20 0908     Subjective COVID-19 screen performed prior to patient entering clinic. Doing ok    Pertinent History Adhesive tape and latex allergy, HTN, CAD, DM, stent placement, loop recoreder, OA, h/o spinal pain, MI, atrial fib    How long can you sit comfortably? Unlimited.    How long can you stand comfortably? Varies.    How long can you walk comfortably? Short community distance.    Patient Stated Goals Get out more and fish.    Currently in Pain? No/denies    Pain Score 0-No pain    Pain Location Leg    Pain Orientation Right    Pain Descriptors / Indicators Other (Comment)    Pain Onset In the past 7 days                                [facility] Adult PT Treatment/Exercise - 12/31/20 0001       Exercises   Exercises Lumbar;Knee/Hip      Lumbar Exercises: Aerobic   Nustep L4 seat 13 x 15 mins O2 93% , 94HR   BP112/63     Knee/Hip Exercises: Standing   Rocker Board 2 minutes    Other Standing Knee Exercises 6In step  toe taps forward and side 2x10 both LEs      Knee/Hip Exercises: Seated   Clamshell with TheraBand Red   3x10                        PT Long Term Goals - 12/23/20 1254       PT LONG TERM GOAL #1  Title Independent with a HEP.    Time 6    Period Weeks    Status New      PT LONG TERM GOAL #2   Title Negative Romberg test.    Time 6    Period Weeks    Status New      PT LONG TERM GOAL #3   Title Perform a reciprocating stair gait with one railing.    Time 6    Period Weeks    Status New      PT LONG TERM GOAL #4   Title Walk 500 feet on uneven terrain (ie:  lawn) without rest and without LOB.    Time 6    Period Weeks    Status New                   Plan - 12/31/20 1001     Clinical Impression Statement Pt arrived today doing fair with low energy levels. Rx focused on sitting exs as well as standing in //bars. BP, O2, and HR monitored during Rx. O2  dropped to 84% at times and needed several rest breaks. Pt is also dealing with chronic LB pain and needs breaks during standing exs.    Personal Factors and Comorbidities Comorbidity 1;Comorbidity 2    Comorbidities Adhesive tape and latex allergy, HTN, CAD, DM, stent placement, loop recoreder, OA, h/o spinal pain, MI, atrial fib    Examination-Participation Restrictions Other;Yard Work    Merchant navy officer Evolving/Moderate complexity    Rehab Potential Good    PT Duration 6 weeks    PT Treatment/Interventions Gait training;Stair training;Functional mobility training;Therapeutic activities;Therapeutic exercise;Balance training;Neuromuscular re-education;Manual  techniques;Patient/family education    PT Next Visit Plan Gait and balance training.  General conditioning.    Consulted and Agree with Plan of Care Patient             Patient will benefit from skilled therapeutic intervention in order to improve the following deficits and impairments:  Abnormal gait, Decreased activity tolerance, Decreased balance, Decreased mobility, Postural dysfunction, Other (comment)  Visit Diagnosis: Unsteadiness on feet     Problem List Patient Active Problem List   Diagnosis Date Noted   S/P femoral-popliteal bypass surgery 11/22/2020   PAOD (peripheral arterial occlusive disease) ([place]) 11/22/2020   Non-ST elevation (NSTEMI) myocardial infarction ([place]) 05/23/2020   Orthostatic hypotension 01/01/2020   Statin myopathy 12/06/2019   Diarrhea 05/10/2018   PAF (paroxysmal atrial fibrillation) ([place]) 03/29/2018   Lacunar infarction ([place]) 03/29/2018   Unstable angina ([place])    Chest pain 01/18/2018   CHF (congestive heart failure) ([place]) 10/21/2017   Anxiety 03/09/2017   Constipation 11/26/2016   Diabetes ([place]) 05/25/2016   Occlusion and stenosis of vertebral artery    Acute renal failure ([place]) 05/23/2016   Diabetic neuropathy ([place]) 01/13/2016   Depression 11/01/2015   Erectile dysfunction 01/23/2015   Morbid obesity ([place]) 01/23/2015   Periodontal disease 01/23/2015   Stopped smoking with greater than 30 pack year history 01/23/2015   Diabetic peripheral neuropathy associated with type 2 diabetes mellitus ([place]) 11/08/2014   Mucosal abnormality of stomach    History of colonic polyps    Abdominal pain 09/06/2014   GERD (gastroesophageal reflux disease) 09/06/2014   Low serum testosterone level 08/01/2014   DDD (degenerative disc disease), cervical 12/20/2013   Headache 08/07/2013   Medically noncompliant 10/25/2011   CAD S/P percutaneous coronary angioplasty 10/25/2011   MURMUR 04/16/2009   Hypertension associated with diabetes ([place]) 08/02/2008  Hyperlipidemia associated with type 2 diabetes mellitus ([place]) 07/31/2008   DISORDERS OF IRON METABOLISM 07/31/2008    Shawntrice Salle,CHRIS, PTA 12/31/2020, 10:09 AM  [facility] [place], [name], [address] Phone: [phone]   Fax:  [phone]  Name: [name] [name] MRN: [id number] Date of Birth: [birth year]

## 2021-01-01 DIAGNOSIS — R809 Proteinuria, unspecified: Secondary | ICD-10-CM | POA: Diagnosis not present

## 2021-01-01 DIAGNOSIS — N189 Chronic kidney disease, unspecified: Secondary | ICD-10-CM | POA: Diagnosis not present

## 2021-01-01 DIAGNOSIS — I5032 Chronic diastolic (congestive) heart failure: Secondary | ICD-10-CM | POA: Diagnosis not present

## 2021-01-01 DIAGNOSIS — D638 Anemia in other chronic diseases classified elsewhere: Secondary | ICD-10-CM | POA: Diagnosis not present

## 2021-01-01 DIAGNOSIS — E1129 Type 2 diabetes mellitus with other diabetic kidney complication: Secondary | ICD-10-CM | POA: Diagnosis not present

## 2021-01-01 DIAGNOSIS — I129 Hypertensive chronic kidney disease with stage 1 through stage 4 chronic kidney disease, or unspecified chronic kidney disease: Secondary | ICD-10-CM | POA: Diagnosis not present

## 2021-01-01 DIAGNOSIS — E559 Vitamin D deficiency, unspecified: Secondary | ICD-10-CM | POA: Diagnosis not present

## 2021-01-01 DIAGNOSIS — E1122 Type 2 diabetes mellitus with diabetic chronic kidney disease: Secondary | ICD-10-CM | POA: Diagnosis not present

## 2021-01-01 DIAGNOSIS — E211 Secondary hyperparathyroidism, not elsewhere classified: Secondary | ICD-10-CM | POA: Diagnosis not present

## 2021-01-02 ENCOUNTER — Encounter (HOSPITAL_COMMUNITY): Payer: Self-pay

## 2021-01-02 ENCOUNTER — Other Ambulatory Visit: Payer: Self-pay

## 2021-01-02 ENCOUNTER — Other Ambulatory Visit: Payer: Self-pay | Admitting: *Deleted

## 2021-01-02 ENCOUNTER — Ambulatory Visit (HOSPITAL_COMMUNITY)
Admission: RE | Admit: 2021-01-02 | Discharge: 2021-01-02 | Disposition: A | Discharge status: Home / Self Care | Payer: Medicare Other | Source: Ambulatory Visit | Attending: Vascular Surgery | Admitting: Vascular Surgery

## 2021-01-02 ENCOUNTER — Ambulatory Visit (INDEPENDENT_AMBULATORY_CARE_PROVIDER_SITE_OTHER): Payer: Medicare Other | Admitting: Vascular Surgery

## 2021-01-02 ENCOUNTER — Ambulatory Visit (INDEPENDENT_AMBULATORY_CARE_PROVIDER_SITE_OTHER): Payer: Medicare Other | Admitting: Family

## 2021-01-02 ENCOUNTER — Telehealth: Payer: Self-pay

## 2021-01-02 ENCOUNTER — Encounter: Payer: Self-pay | Admitting: Vascular Surgery

## 2021-01-02 ENCOUNTER — Encounter: Payer: Self-pay | Admitting: Family

## 2021-01-02 VITALS — BP 166/80 | HR 86 | Temp 97.0°F | Ht 74.0 in | Wt 296.8 lb

## 2021-01-02 VITALS — BP 154/68 | HR 85 | Temp 98.1°F | Resp 18 | Ht 74.0 in | Wt 294.0 lb

## 2021-01-02 DIAGNOSIS — I7025 Atherosclerosis of native arteries of other extremities with ulceration: Secondary | ICD-10-CM | POA: Insufficient documentation

## 2021-01-02 DIAGNOSIS — Z794 Long term (current) use of insulin: Secondary | ICD-10-CM

## 2021-01-02 DIAGNOSIS — I6502 Occlusion and stenosis of left vertebral artery: Secondary | ICD-10-CM

## 2021-01-02 DIAGNOSIS — E0841 Diabetes mellitus due to underlying condition with diabetic mononeuropathy: Secondary | ICD-10-CM

## 2021-01-02 DIAGNOSIS — M7989 Other specified soft tissue disorders: Secondary | ICD-10-CM | POA: Diagnosis not present

## 2021-01-02 DIAGNOSIS — I779 Disorder of arteries and arterioles, unspecified: Secondary | ICD-10-CM | POA: Diagnosis not present

## 2021-01-02 DIAGNOSIS — Z48812 Encounter for surgical aftercare following surgery on the circulatory system: Secondary | ICD-10-CM

## 2021-01-02 DIAGNOSIS — E1142 Type 2 diabetes mellitus with diabetic polyneuropathy: Secondary | ICD-10-CM

## 2021-01-02 MED ORDER — HYDROCODONE-ACETAMINOPHEN 5-325 MG PO TABS: 1.0000 | ORAL_TABLET | Freq: Four times a day (QID) | ORAL | 0 refills | Status: DC | PRN

## 2021-01-02 MED ORDER — GABAPENTIN 800 MG PO TABS: 800.0000 mg | ORAL_TABLET | Freq: Four times a day (QID) | ORAL | 2 refills | Status: DC | PRN

## 2021-01-02 NOTE — Telephone Encounter (Signed)
Patient calls today to ask for more pain medicine. He is s/p L FPBG on 7/8 by Dr. [name]. His left leg is very swollen from groin to foot and is painful. Denies any redness or drainage from the incision sites, "they look fine." The pain is constant, involves his "whole frickin' leg' and keeps him up at night. It is worse with walking. Says it has gotten slightly better, but it is still bad. Discussed with PA, bringing patient in today to for duplex to ensure patency of graft and provider visit.

## 2021-01-02 NOTE — Progress Notes (Signed)
Patient name: [name] [name] MRN: [id number] DOB: 1950-12-15 Sex: male  REASON FOR VISIT:   Pain and swelling in the left leg.  This was an urgent add-on today.  HPI:   [name] [name] is a pleasant 70 y.o. male who underwent a left femoral to above-knee popliteal artery bypass with a vein and right SFA stent by Dr. [name] on 11/22/2020.  He was last seen in our office on 12/20/2020 by the physicians assistant and was noted to have a wound in the medial left thigh for the above-knee popliteal artery exposure.  He had a group graft duplex at that time which showed that his SFA stent was patent.  The distal posterior tibial artery and anterior tibial artery appear to be occluded at the ankle.  On the left side his bypass graft was patent.  He was to return in 2 weeks with Dr. [name] as there were no openings on the PA clinic schedule.  He comes in today complaining of pain in the left leg.  The pain involves the entire left leg and his main complaint is swelling.  The swelling seems to be better when he elevates his leg.  This was gradual in onset.  Current Outpatient Medications  Medication Sig Dispense Refill   albuterol (VENTOLIN HFA) 108 (90 Base) MCG/ACT inhaler Inhale 2 puffs into the lungs every 6 (six) hours as needed for wheezing or shortness of breath. 8.5 g 3   apixaban (ELIQUIS) 5 MG TABS tablet Take 1 tablet (5 mg total) by mouth 2 (two) times daily. 180 tablet 1   Chlorphen-Phenyleph-ASA (ALKA-SELTZER PLUS COLD PO) Take 2 tablets by mouth daily as needed (allergies).     clopidogrel (PLAVIX) 75 MG tablet Take 1 tablet by mouth once daily 90 tablet 0   dapagliflozin propanediol (FARXIGA) 10 MG TABS tablet Take 1 tablet (10 mg total) by mouth daily before breakfast. 90 tablet 3   DULoxetine (CYMBALTA) 30 MG capsule Take 1 capsule by mouth once daily 90 capsule 0   ezetimibe (ZETIA) 10 MG tablet Take 1 tablet (10 mg total) by mouth daily. 90 tablet 3   fenofibrate 160 MG tablet TAKE 1 TABLET  BY MOUTH ONCE DAILY FOR CHOLESTEROL AND TRIGLYCERIDES 90 tablet 3   furosemide (LASIX) 20 MG tablet Take Two Times Daily for 7 Days then take Daily 97 tablet 3   gabapentin (NEURONTIN) 800 MG tablet Take 1 tablet (800 mg total) by mouth 4 (four) times daily as needed. 120 tablet 2   insulin lispro (HUMALOG KWIKPEN) 100 UNIT/ML KwikPen Inject 15-20 Units into the skin 3 (three) times daily with meals. 15 mL 11   insulin NPH-regular Human (70-30) 100 UNIT/ML injection Inject 60 Units into the skin 2 (two) times daily with a meal.     isosorbide mononitrate (IMDUR) 30 MG 24 hr tablet Take 1 tablet by mouth once daily 90 tablet 0   linaclotide (LINZESS) 72 MCG capsule Take 1 capsule (72 mcg total) by mouth daily as needed (constipation). 90 capsule 3   Menthol, Topical Analgesic, (BLUE-EMU MAXIMUM STRENGTH EX) Apply 1 application topically 2 (two) times daily.     metoprolol succinate (TOPROL-XL) 50 MG 24 hr tablet Take 1 tablet by mouth once daily 90 tablet 3   metoprolol tartrate (LOPRESSOR) 50 MG tablet Take 50 mg by mouth daily as needed (a-fib).     nitroGLYCERIN (NITROSTAT) 0.4 MG SL tablet DISSOLVE ONE TABLET UNDER THE TONGUE EVERY 5 MINUTES AS NEEDED FOR CHEST  PAIN.  DO NOT EXCEED A TOTAL OF 3 DOSES IN 15 MINUTES 25 tablet 0   pantoprazole (PROTONIX) 40 MG tablet Take 1 tablet by mouth once daily 90 tablet 1   Semaglutide, 1 MG/DOSE, (OZEMPIC, 1 MG/DOSE,) 2 MG/1.5ML SOPN Inject 0.75 mLs (1 mg total) into the skin once a week. 12 mL 4   triamcinolone (NASACORT) 55 MCG/ACT AERO nasal inhaler Place 1 spray into the nose as needed (allergies).     vitamin B-12 (CYANOCOBALAMIN) 1000 MCG tablet Take 1,000 mcg by mouth daily.     HYDROcodone-acetaminophen (NORCO) 5-325 MG tablet Take 1 tablet by mouth every 6 (six) hours as needed for moderate pain. (Patient not taking: Reported on 01/02/2021) 30 tablet 0   No current facility-administered medications for this visit.    REVIEW OF SYSTEMS:  '[X]'$   denotes positive finding, '[ ]'$  denotes negative finding Vascular    Leg swelling    Cardiac    Chest pain or chest pressure:    Shortness of breath upon exertion:    Short of breath when lying flat:    Irregular heart rhythm:    Constitutional    Fever or chills:     PHYSICAL EXAM:   Vitals:   01/02/21 1358  BP: (!) 154/68  Pulse: 85  Resp: 18  Temp: 98.1 F (36.7 C)  TempSrc: Temporal  SpO2: 98%  Weight: 294 lb (133.4 kg)  Height: '6\' 2"'$  (1.88 m)    GENERAL: The patient is a well-nourished male, in no acute distress. The vital signs are documented above. CARDIOVASCULAR: There is a regular rate and rhythm. PULMONARY: There is good air exchange bilaterally without wheezing or rales. VASCULAR: He has a brisk anterior tibial and posterior tibial signal on the right.  He has a brisk dorsalis pedis and posterior tibial signal on the left. He has moderate left lower extremity swelling. The wound on his medial left thigh is unchanged compared to his visit 2 weeks ago.  DATA:   VENOUS DUPLEX: I have independently interpreted his venous duplex scan today.  There is no evidence of DVT in the left lower extremity.  MEDICAL ISSUES:   S/P LEFT FEMORAL ABOVE-KNEE POPLITEAL BYPASS (VEIN): His bypass graft is patent.  He had a duplex 2 weeks ago and he has brisk Doppler signals in the left foot.  He has moderate left lower extremity swelling.  I obtained a venous duplex scan today and there was no evidence of DVT in the left lower extremity.  We have discussed the importance of intermittent leg elevation and the proper positioning for this.  I have also given him instructions for how to take care of his thigh wound.  He was having significant pain and I have written a prescription for 20 hydrocodone for pain that was sent electronically to his pharmacy.  He will keep his previously scheduled appointment with Dr. [name].  Deitra Mayo [facility]  [place]

## 2021-01-02 NOTE — Patient Instructions (Signed)
Leory Plowman and Daroff's neurology in clinical practice (8th ed., pp. [id number] [phone]). Elsevier."> Goldman-Cecil medicine (26th ed., pp. [phone]- [phone]). Elsevier.">  Peripheral Neuropathy Peripheral neuropathy is a type of nerve damage. It affects nerves that carry signals between the spinal cord and the arms, legs, and the rest of the body (peripheral nerves). It does not affect nerves in the spinal cord or brain. In peripheral neuropathy, one nerve or a group of nerves may be damaged. Peripheral neuropathy is a broad category that includes many specific nerve disorders,like diabetic neuropathy, hereditary neuropathy, and carpal tunnel syndrome. What are the causes? This condition may be caused by: Diabetes. This is the most common cause of peripheral neuropathy. Nerve injury. Pressure or stress on a nerve that lasts a long time. Lack (deficiency) of B vitamins. This can result from alcoholism, poor diet, or a restricted diet. Infections. Autoimmune diseases, such as rheumatoid arthritis and systemic lupus erythematosus. Nerve diseases that are passed from parent to child (inherited). Some medicines, such as cancer medicines (chemotherapy). Poisonous (toxic) substances, such as lead and mercury. Too little blood flowing to the legs. Kidney disease. Thyroid disease. In some cases, the cause of this condition is not known. What are the signs or symptoms? Symptoms of this condition depend on which of your nerves is damaged. Common symptoms include: Loss of feeling (numbness) in the feet, hands, or both. Tingling in the feet, hands, or both. Burning pain. Very sensitive skin. Weakness. Not being able to move a part of the body (paralysis). Muscle twitching. Clumsiness or poor coordination. Loss of balance. Not being able to control your bladder. Feeling dizzy. Sexual problems. How is this diagnosed? Diagnosing and finding the cause of peripheral neuropathy can be difficult. Your health care  provider will take your medical history and do a physical exam. A neurological exam will also be done. This involves checking things that are affected by your brain, spinal cord, and nerves (nervous system). For example, your health care provider will check your reflexes, how youmove, and what you can feel. You may have other tests, such as: Blood tests. Electromyogram (EMG) and nerve conduction tests. These tests check nerve function and how well the nerves are controlling the muscles. Imaging tests, such as CT scans or MRI to rule out other causes of your symptoms. Removing a small piece of nerve to be examined in a lab (nerve biopsy). Removing and examining a small amount of the fluid that surrounds the brain and spinal cord (lumbar puncture). How is this treated? Treatment for this condition may involve: Treating the underlying cause of the neuropathy, such as diabetes, kidney disease, or vitamin deficiencies. Stopping medicines that can cause neuropathy, such as chemotherapy. Medicine to help relieve pain. Medicines may include: Prescription or over-the-counter pain medicine. Antiseizure medicine. Antidepressants. Pain-relieving patches that are applied to painful areas of skin. Surgery to relieve pressure on a nerve or to destroy a nerve that is causing pain. Physical therapy to help improve movement and balance. Devices to help you move around (assistive devices). Follow these instructions at home: Medicines Take over-the-counter and prescription medicines only as told by your health care provider. Do not take any other medicines without first asking your health care provider. Do not drive or use heavy machinery while taking prescription pain medicine. Lifestyle  Do not use any products that contain nicotine or tobacco, such as cigarettes and e-cigarettes. Smoking keeps blood from reaching damaged nerves. If you need help quitting, ask your health care provider. Avoid or limit  alcohol. Too much alcohol can cause a vitamin B deficiency, and vitamin B is needed for healthy nerves. Eat a healthy diet. This includes: Eating foods that are high in fiber, such as fresh fruits and vegetables, whole grains, and beans. Limiting foods that are high in fat and processed sugars, such as fried or sweet foods.  General instructions  If you have diabetes, work closely with your health care provider to keep your blood sugar under control. If you have numbness in your feet: Check every day for signs of injury or infection. Watch for redness, warmth, and swelling. Wear padded socks and comfortable shoes. These help protect your feet. Develop a good support system. Living with peripheral neuropathy can be stressful. Consider talking with a mental health specialist or joining a support group. Use assistive devices and attend physical therapy as told by your health care provider. This may include using a walker or a cane. Keep all follow-up visits as told by your health care provider. This is important.  Contact a health care provider if: You have new signs or symptoms of peripheral neuropathy. You are struggling emotionally from dealing with peripheral neuropathy. Your pain is not well-controlled. Get help right away if: You have an injury or infection that is not healing normally. You develop new weakness in an arm or leg. You have fallen or do so frequently. Summary Peripheral neuropathy is when the nerves in the arms, or legs are damaged, resulting in numbness, weakness, or pain. There are many causes of peripheral neuropathy, including diabetes, pinched nerves, vitamin deficiencies, autoimmune disease, and hereditary conditions. Diagnosing and finding the cause of peripheral neuropathy can be difficult. Your health care provider will take your medical history, do a physical exam, and do tests, including blood tests and nerve function tests. Treatment involves treating the  underlying cause of the neuropathy and taking medicines to help control pain. Physical therapy and assistive devices may also help. This information is not intended to replace advice given to you by your health care provider. Make sure you discuss any questions you have with your healthcare provider. Document Revised: 02/13/2020 Document Reviewed: 02/13/2020 Elsevier Patient Education  2022 [employer].

## 2021-01-02 NOTE — Progress Notes (Signed)
Subjective:    Patient ID: [name] [name], male    DOB: 30-Nov-1950, 70 y.o.   MRN: [id number]  Chief Complaint  Patient presents with   Foot Pain    Foot Pain  Pt had a femoral-popliteal bypass on his left leg on 11/22/20. He states since his procedure he has feeling in his feet now. However, now that he has feeling he has intermittent tingling pain of 3 out 10. It can be up to a 10 out 10.   He has a follow up appointment with Vascular today.   He is currently taking Gabapentin 900 mg BID. He states this is not helping with his pain currently.   Review of Systems  All other systems reviewed and are negative.     Objective:   Physical Exam Vitals reviewed.  Constitutional:      General: He is not in acute distress.    Appearance: He is well-developed.  HENT:     Head: Normocephalic.     Right Ear: Tympanic membrane normal.     Left Ear: Tympanic membrane normal.  Eyes:     General:        Right eye: No discharge.        Left eye: No discharge.     Pupils: Pupils are equal, round, and reactive to light.  Neck:     Thyroid: No thyromegaly.  Cardiovascular:     Rate and Rhythm: Normal rate and regular rhythm.     Heart sounds: Normal heart sounds. No murmur heard. Pulmonary:     Effort: Pulmonary effort is normal. No respiratory distress.     Breath sounds: Normal breath sounds. No wheezing.  Abdominal:     General: Bowel sounds are normal. There is no distension.     Palpations: Abdomen is soft.     Tenderness: There is no abdominal tenderness.  Musculoskeletal:        General: No tenderness. Normal range of motion.     Cervical back: Normal range of motion and neck supple.     Left lower leg: Edema (trace) present.  Skin:    General: Skin is warm and dry.     Findings: No erythema or rash.  Neurological:     Mental Status: He is alert and oriented to person, place, and time.     Cranial Nerves: No cranial nerve deficit.     Deep Tendon Reflexes: Reflexes are  normal and symmetric.  Psychiatric:        Behavior: Behavior normal.        Thought Content: Thought content normal.        Judgment: Judgment normal.     BP (!) 166/80   Pulse 86   Temp (!) 97 F (36.1 C) (Temporal)   Ht '6\' 2"'$  (1.88 m)   Wt 296 lb 12.8 oz (134.6 kg)   SpO2 98%   BMI 38.11 kg/m       Assessment & Plan:  [name] [name] comes in today with chief complaint of Foot Pain   Diagnosis and orders addressed:  1. Occlusion and stenosis of left vertebral artery - gabapentin (NEURONTIN) 800 MG tablet; Take 1 tablet (800 mg total) by mouth 4 (four) times daily as needed.  Dispense: 120 tablet; Refill: 2  2. PAOD (peripheral arterial occlusive disease) (HCC) - gabapentin (NEURONTIN) 800 MG tablet; Take 1 tablet (800 mg total) by mouth 4 (four) times daily as needed.  Dispense: 120 tablet; Refill: 2  3.  Type 2 diabetes mellitus with diabetic polyneuropathy, with long-term current use of insulin ([place])  4. Diabetic mononeuropathy associated with diabetes mellitus due to underlying condition ([place]) - gabapentin (NEURONTIN) 800 MG tablet; Take 1 tablet (800 mg total) by mouth 4 (four) times daily as needed.  Dispense: 120 tablet; Refill: 2   Will increase gabapentin to 800 mg QID from 900 mg BID. Keep Vascular appt  Health Maintenance reviewed Diet and exercise encouraged  Follow up plan: Keep chronic follow up   [name], FNP

## 2021-01-06 ENCOUNTER — Ambulatory Visit (INDEPENDENT_AMBULATORY_CARE_PROVIDER_SITE_OTHER): Payer: Medicare Other

## 2021-01-06 VITALS — Ht 74.0 in | Wt 294.0 lb

## 2021-01-06 DIAGNOSIS — Z0000 Encounter for general adult medical examination without abnormal findings: Secondary | ICD-10-CM | POA: Diagnosis not present

## 2021-01-06 NOTE — Progress Notes (Signed)
Subjective:   [name] [name] is a 70 y.o. male who presents for Medicare Annual/Subsequent preventive examination.  Virtual Visit via Telephone Note  I connected with  [name] [name] on 01/06/21 at  9:00 AM EDT by telephone and verified that I am speaking with the correct person using two identifiers.  Location: Patient: Home Provider: [facility] Persons participating in the virtual visit: patient/Nurse Health Advisor   I discussed the limitations, risks, security and privacy concerns of performing an evaluation and management service by telephone and the availability of in person appointments. The patient expressed understanding and agreed to proceed.  Interactive audio and video telecommunications were attempted between this nurse and patient, however failed, due to patient having technical difficulties OR patient did not have access to video capability.  We continued and completed visit with audio only.  Some vital signs may be absent or patient reported.   Jamey Harman E Gara Kincade, LPN   Review of Systems     Cardiac Risk Factors include: advanced age (>42mn, >>52women);male gender;obesity (BMI >30kg/m2);sedentary lifestyle;hypertension;family history of premature cardiovascular disease;diabetes mellitus;dyslipidemia;smoking/ tobacco exposure     Objective:    Today's Vitals   01/06/21 0902  Weight: 294 lb (133.4 kg)  Height: '6\' 2"'$  (1.88 m)  PainSc: 3    Body mass index is 37.75 kg/m.  Advanced Directives 01/06/2021 12/23/2020 11/23/2020 11/20/2020 11/12/2020 06/06/2020 05/23/2020  Does Patient Have a Medical Advance Directive? Yes Yes No No No No Yes  Type of AParamed[place]of [place] will - - - - - HPress photographerLiving will  Does patient want to make changes to medical advance directive? - - - - - No - Patient declined No - Patient declined  Copy of HEast [place]in Chart? No - copy requested - - - - - No - copy requested  Would patient  like information on creating a medical advance directive? - - No - Patient declined No - Patient declined No - Patient declined No - Patient declined -    Current Medications (verified) Outpatient Encounter Medications as of 01/06/2021  Medication Sig   albuterol (VENTOLIN HFA) 108 (90 Base) MCG/ACT inhaler Inhale 2 puffs into the lungs every 6 (six) hours as needed for wheezing or shortness of breath.   apixaban (ELIQUIS) 5 MG TABS tablet Take 1 tablet (5 mg total) by mouth 2 (two) times daily.   clopidogrel (PLAVIX) 75 MG tablet Take 1 tablet by mouth once daily   dapagliflozin propanediol (FARXIGA) 10 MG TABS tablet Take 1 tablet (10 mg total) by mouth daily before breakfast.   DULoxetine (CYMBALTA) 30 MG capsule Take 1 capsule by mouth once daily   ezetimibe (ZETIA) 10 MG tablet Take 1 tablet (10 mg total) by mouth daily.   fenofibrate 160 MG tablet TAKE 1 TABLET BY MOUTH ONCE DAILY FOR CHOLESTEROL AND TRIGLYCERIDES   furosemide (LASIX) 20 MG tablet Take Two Times Daily for 7 Days then take Daily   gabapentin (NEURONTIN) 800 MG tablet Take 1 tablet (800 mg total) by mouth 4 (four) times daily as needed.   HYDROcodone-acetaminophen (NORCO) 5-325 MG tablet Take 1-2 tablets by mouth every 6 (six) hours as needed for moderate pain.   insulin lispro (HUMALOG KWIKPEN) 100 UNIT/ML KwikPen Inject 15-20 Units into the skin 3 (three) times daily with meals.   insulin NPH-regular Human (70-30) 100 UNIT/ML injection Inject 60 Units into the skin 2 (two) times daily with a meal.   isosorbide mononitrate (  IMDUR) 30 MG 24 hr tablet Take 1 tablet by mouth once daily   Menthol, Topical Analgesic, (BLUE-EMU MAXIMUM STRENGTH EX) Apply 1 application topically 2 (two) times daily.   metoprolol succinate (TOPROL-XL) 50 MG 24 hr tablet Take 1 tablet by mouth once daily   metoprolol tartrate (LOPRESSOR) 50 MG tablet Take 50 mg by mouth daily as needed (a-fib).   pantoprazole (PROTONIX) 40 MG tablet Take 1 tablet by  mouth once daily   triamcinolone (NASACORT) 55 MCG/ACT AERO nasal inhaler Place 1 spray into the nose as needed (allergies).   vitamin B-12 (CYANOCOBALAMIN) 1000 MCG tablet Take 1,000 mcg by mouth daily.   Chlorphen-Phenyleph-ASA (ALKA-SELTZER PLUS COLD PO) Take 2 tablets by mouth daily as needed (allergies). (Patient not taking: Reported on 01/06/2021)   HYDROcodone-acetaminophen (NORCO) 5-325 MG tablet Take 1 tablet by mouth every 6 (six) hours as needed for moderate pain. (Patient not taking: Reported on 01/06/2021)   linaclotide (LINZESS) 72 MCG capsule Take 1 capsule (72 mcg total) by mouth daily as needed (constipation). (Patient not taking: Reported on 01/06/2021)   nitroGLYCERIN (NITROSTAT) 0.4 MG SL tablet DISSOLVE ONE TABLET UNDER THE TONGUE EVERY 5 MINUTES AS NEEDED FOR CHEST PAIN.  DO NOT EXCEED A TOTAL OF 3 DOSES IN 15 MINUTES   Semaglutide, 1 MG/DOSE, (OZEMPIC, 1 MG/DOSE,) 2 MG/1.5ML SOPN Inject 0.75 mLs (1 mg total) into the skin once a week. (Patient not taking: Reported on 01/06/2021)   No facility-administered encounter medications on file as of 01/06/2021.    Allergies (verified) Shellfish allergy, Sulfa antibiotics, Ace inhibitors, Invokana [canagliflozin], Lisinopril, Metformin and related, Pravastatin sodium, Milk-related compounds, Crestor [rosuvastatin], Fenofibrate, Horse-derived products, Iodine, Lexapro [escitalopram oxalate], Lipitor [atorvastatin], Livalo [pitavastatin], Repatha [evolocumab], and Tape   History: Past Medical History:  Diagnosis Date   Anxiety    Arthritis    Atrial fibrillation ([place])    CAD (coronary artery disease)    a. 2010: DES to CTO of RCA. EF 55% b. 07/2016: cath showing total occlusion within previously placed RCA stent (collaterals present), severe stenosis along LCx and OM1 (treated with 2 overlapping DES). c. repeat cath in 01/2018 showing patent stents along LCx and OM with CTO of D2, CTO of distal LCx, and CTO of RCA with collaterals present  overall unchanged since 2018 with medical management recom   Cellulitis and abscess rt groin    Complication of anesthesia    " I woke up during a colonoscopy "      Depression    Diabetes mellitus    Diastolic CHF ([place])    Disorders of iron metabolism    Dysrhythmia    GERD (gastroesophageal reflux disease)    Hyperlipidemia    Hypertension    Low serum testosterone level    Medically noncompliant    Myocardial infarction [facility])    Past Surgical History:  Procedure Laterality Date   ABDOMINAL AORTOGRAM W/LOWER EXTREMITY N/A 03/21/2019   Procedure: ABDOMINAL AORTOGRAM W/LOWER EXTREMITY;  Surgeon: [name], MD;  Location: [place] [facility];  Service: Cardiovascular;  Laterality: N/A;   ABDOMINAL AORTOGRAM W/LOWER EXTREMITY N/A 11/12/2020   Procedure: ABDOMINAL AORTOGRAM W/LOWER EXTREMITY;  Surgeon: [name], MD;  Location: [place] [facility];  Service: Cardiovascular;  Laterality: N/A;   BACK SURGERY  2015   ACDF by Dr. [name]   COLONOSCOPY N/A 10/01/2014   Dr. [name]: multiple tubular adenomas removed, colonic diverticulosis, redundant colon. next tcs advised for 09/2017. PATIENT NEEDS PROPOFOL FOR FAILED CONSCIOUS SEDATION  CORONARY STENT INTERVENTION N/A 07/30/2016   Procedure: Coronary Stent Intervention;  Surgeon: [name], MD;  Location: [place] [facility];  Service: Cardiovascular;  Laterality: N/A;   CORONARY STENT PLACEMENT  2000   By Dr. Olevia Perches   EP IMPLANTABLE DEVICE N/A 05/25/2016   Procedure: Loop Recorder Insertion;  Surgeon: [name], MD;  Location: [place] [facility];  Service: Cardiovascular;  Laterality: N/A;   ESOPHAGOGASTRODUODENOSCOPY     [facility]ophagus [facility]   ESOPHAGOGASTRODUODENOSCOPY N/A 10/01/2014   Dr. [name]: patchy mottling/erythema and minimal polypoid appearance of gastric mucosa. bx with mild inlammation but no H.pylori   FEMORAL-POPLITEAL BYPASS GRAFT Left 11/22/2020   Procedure: LEFT FEMORAL-POPLITEAL BYPASS GRAFT;   Surgeon: [name], MD;  Location: [place];  Service: Vascular;  Laterality: Left;   HERNIA REPAIR  [id number]   umbilical   INSERTION OF ILIAC STENT Right 11/22/2020   Procedure: INSERTION OF ELUVIA STENT INTO RIGHT DISTAL SUPERFICIAL FEMORAL ARTERY;  Surgeon: [name], MD;  Location: [place];  Service: Vascular;  Laterality: Right;   LEFT HEART CATH AND CORONARY ANGIOGRAPHY N/A 07/30/2016   Procedure: Left Heart Cath and Coronary Angiography;  Surgeon: [name], MD;  Location: [place] [facility];  Service: Cardiovascular;  Laterality: N/A;   LEFT HEART CATH AND CORONARY ANGIOGRAPHY N/A 01/19/2018   Procedure: LEFT HEART CATH AND CORONARY ANGIOGRAPHY;  Surgeon: [name], MD;  Location: [place] [facility];  Service: Cardiovascular;  Laterality: N/A;   LEFT HEART CATH AND CORONARY ANGIOGRAPHY N/A 05/24/2020   Procedure: LEFT HEART CATH AND CORONARY ANGIOGRAPHY;  Surgeon: Burnell Blanks, MD;  Location: [place] [facility];  Service: Cardiovascular;  Laterality: N/A;   LESION REMOVAL     Lip and hand    LOWER EXTREMITY ANGIOGRAM Right 11/22/2020   Procedure: RIGHT LEG ANGIOGRAM;  Surgeon: [name], MD;  [facility];  Service: Vascular;  Laterality: Right;   LOWER EXTREMITY ANGIOGRAPHY N/A 04/18/2019   Procedure: LOWER EXTREMITY ANGIOGRAPHY;  Surgeon: [name], MD;  Location: [place] [facility];  Service: Cardiovascular;  Laterality: N/A;   NECK SURGERY     PERIPHERAL VASCULAR BALLOON ANGIOPLASTY  04/18/2019   Procedure: PERIPHERAL VASCULAR BALLOON ANGIOPLASTY;  Surgeon: [name], MD;  Location: [place] [facility];  Service: Cardiovascular;;   PERIPHERAL VASCULAR BALLOON ANGIOPLASTY Left 11/12/2020   Procedure: PERIPHERAL VASCULAR BALLOON ANGIOPLASTY;  Surgeon: [name], MD;  Location: [place] [facility];  Service: Cardiovascular;  Laterality: Left;  Failed PTA of superficial femoral artery.   PERIPHERAL VASCULAR INTERVENTION Right 03/21/2019    Procedure: PERIPHERAL VASCULAR INTERVENTION;  Surgeon: [name], MD;  Location: [place] [facility];  Service: Cardiovascular;  Laterality: Right;  superficial femoral   Family History  Problem Relation Age of Onset   Diabetes Father    Valvular heart disease Father    Arthritis Father    Heart disease Father    Stroke Father    Alzheimer's disease Mother    Hyperlipidemia Mother    Hypertension Mother    Arthritis Mother    Lung cancer Mother    Stroke Mother    Headache Mother    Arthritis/Rheumatoid Sister    Diabetes Sister    Hypertension Sister    Hyperlipidemia Sister    Depression Sister    Dementia Maternal Aunt    Dementia Maternal Uncle    Heart disease Maternal Uncle    Stomach  cancer Paternal Uncle    Colon cancer Neg Hx    Liver disease Neg Hx    Social History   Socioeconomic History   Marital status: Married    Spouse name: Belenda Cruise   Number of children: 2   Years of education: 12   Highest education level: 12th grade  Occupational History   Occupation: retired  Tobacco Use   Smoking status: Former    Packs/day: 0.25    Years: 51.00    Pack years: 12.75    Types: Cigarettes    Quit date: 08/14/2016    Years since quitting: 4.4   Smokeless tobacco: Never   Tobacco comments:    smokes  a pack a week  Vaping Use   Vaping Use: Never used  Substance and Sexual Activity   Alcohol use: No    Alcohol/week: 0.0 standard drinks   Drug use: No   Sexual activity: Yes  Other Topics Concern   Not on file  Social History Narrative   Lives with his wife.  Retired.  Unable to afford expensive medicines.     He has 2 children from previous marriage - they live in [place]       Caffeine: 1 cup of 1/2 caff coffee in AM, drinks more if at a restaurant    Social Determinants of Health   Financial Resource Strain: Medium Risk   Difficulty of Paying Living Expenses: Somewhat hard  Food Insecurity: No Food Insecurity   Worried About Sales executive in the Last Year: Never true   Ran Out of Food in the Last Year: Never true  Transportation Needs: No Transportation Needs   Lack of Transportation (Medical): No   Lack of Transportation (Non-Medical): No  Physical Activity: Insufficiently Active   Days of Exercise per Week: 7 days   Minutes of Exercise per Session: 10 min  Stress: No Stress Concern Present   Feeling of Stress : Only a little  Social Connections: Moderately Isolated   Frequency of Communication with Friends and Family: More than three times a week   Frequency of Social Gatherings with Friends and Family: Twice a week   Attends Religious Services: Never   Marine scientist or Organizations: No   Attends Music therapist: Never   Marital Status: Married    Tobacco Counseling Counseling given: Not Answered Tobacco comments: smokes  a pack a week   Clinical Intake:  Pre-visit preparation completed: Yes  Pain : No/denies pain Pain Score: 3  Pain Location: Generalized Pain Descriptors / Indicators: Aching, Sore, Discomfort Pain Onset: More than a month ago Pain Frequency: Intermittent     BMI - recorded: 37.75 Nutritional Status: BMI > 30  Obese Nutritional Risks: Nausea/ vomitting/ diarrhea (diarrhea last night - unknown reason) Diabetes: Yes CBG done?: No Did pt. bring in CBG monitor from home?: No  How often do you need to have someone help you when you read instructions, pamphlets, or other written materials from your doctor or pharmacy?: 1 - Never  Diabetic? Nutrition Risk Assessment:  Has the patient had any N/V/D within the last 2 months?  No  Does the patient have any non-healing wounds?  No  Has the patient had any unintentional weight loss or weight gain?  No   Diabetes:  Is the patient diabetic?  Yes  If diabetic, was a CBG obtained today?  No  Did the patient bring in their glucometer from home?  No  How often do  you monitor your CBG's? Once or twice per day.    Financial Strains and Diabetes Management:  Are you having any financial strains with the device, your supplies or your medication? No .  Does the patient want to be seen by Chronic Care Management for management of their diabetes?  No  Would the patient like to be referred to a Nutritionist or for Diabetic Management?  No   Diabetic Exams:  Diabetic Eye Exam: Completed 05/22/2020.   Diabetic Foot Exam: Completed 08/19/2020. Pt has been advised about the importance in completing this exam. Pt is scheduled for diabetic foot exam on next year.    Interpreter Needed?: No  Information entered by :: Avantika Shere, LPN   Activities of Daily Living In your present state of health, do you have any difficulty performing the following activities: 01/06/2021 11/23/2020  Hearing? N N  Vision? N N  Difficulty concentrating or making decisions? Y N  Walking or climbing stairs? Y N  Dressing or bathing? N N  Doing errands, shopping? N N  Preparing Food and eating ? N -  Using the Toilet? N -  In the past six months, have you accidently leaked urine? N -  Do you have problems with loss of bowel control? N -  Managing your Medications? N -  Managing your Finances? N -  Housekeeping or managing your Housekeeping? N -  Some recent data might be hidden    Patient Care Team: [name], FNP as PCP - General (Family Medicine) [name], MD as PCP - Electrophysiology (Cardiology) Josue Hector, MD as PCP - Cardiology (Cardiology) [name], MD as Consulting Physician (Vascular Surgery) [name] Cristopher Estimable, MD as Consulting Physician (Gastroenterology) Melvenia Beam, MD as Consulting Physician (Neurology) Lavera Guise, [facility] (Pharmacist) Dorene Ar, MD (Pain Medicine) Liana Gerold, MD as Consulting Physician (Nephrology) Celestia Khat, OD (Optometry) [place], Rowan Blase, PTA as Physical Therapy Assistant (Orthopedic Surgery)  Indicate any recent Medical Services  you may have received from other than [facility] providers in the past year (date may be approximate).     Assessment:   This is a routine wellness examination for [name] [name].  Hearing/Vision screen Hearing Screening - Comments:: Denies hearing difficulties  Vision Screening - Comments:: Wears glasses prn - up to date with annual eye exams at [facility] issues and exercise activities discussed: Current Exercise Habits: Home exercise routine, Type of exercise: walking, Time (Minutes): 10, Frequency (Times/Week): 7, Weekly Exercise (Minutes/Week): 70, Intensity: Mild, Exercise limited by: cardiac condition(s);respiratory conditions(s);orthopedic condition(s)   Goals Addressed             This Visit's Progress    DIET - EAT MORE FRUITS AND VEGETABLES   On track    Increase physical activity   Not on track      Depression Screen PHQ 2/9 Scores 01/06/2021 11/19/2020 08/19/2020 07/18/2020 05/31/2020 05/20/2020 02/15/2020  PHQ - 2 Score 0 0 0 0 0 0 3  PHQ- 9 Score 0 0 - - - 0 17    Fall Risk Fall Risk  01/06/2021 11/19/2020 08/19/2020 07/18/2020 05/31/2020  Falls in the past year? 1 1 0 0 0  Number falls in past yr: 1 1 - - -  Injury with Fall? 0 0 - - -  Risk Factor Category  - - - - -  Risk for fall due to : History of fall(s);Orthopedic patient;Impaired balance/gait;Impaired vision;Medication side effect History of fall(s) - - -  Risk for fall due to: Comment - - - - -  Follow up Education provided;Falls prevention discussed Education provided - - -    FALL RISK PREVENTION PERTAINING TO THE HOME:  Any stairs in or around the home? No  If so, are there any without handrails? No  Home free of loose throw rugs in walkways, pet beds, electrical cords, etc? Yes  Adequate lighting in your home to reduce risk of falls? Yes   ASSISTIVE DEVICES UTILIZED TO PREVENT FALLS:  Life alert? No  Use of a cane, walker or w/c? Yes  Grab bars in the bathroom? Yes  Shower chair or bench in shower? Yes   Elevated toilet seat or a handicapped toilet? Yes   TIMED UP AND GO:  Was the test performed? No . Telephonic visit  Cognitive Function: Normal cognitive status assessed by direct observation by this Nurse Health Advisor. No abnormalities found.    MMSE - Mini Mental State Exam 12/22/2017 12/22/2017 12/15/2016 12/11/2015 08/01/2014  Orientation to time '5 5 5 5 5  '$ Orientation to Place '5 5 5 5 5  '$ Registration 3 - - 3 3  Attention/ Calculation 5 - '5 5 5  '$ Recall 3 - - 3 1  Language- name 2 objects 2 - '2 2 1  '$ Language- repeat 1 - - 1 1  Language- follow 3 step command 3 - - 3 3  Language- read & follow direction 1 - - 1 1  Write a sentence 1 - - 1 1  Copy design 1 - - 1 1  Total score 30 - - 30 27     6CIT Screen 01/06/2021 01/05/2020 01/04/2019  What Year? 0 points 0 points 0 points  What month? 0 points 0 points 0 points  What time? 0 points 0 points 0 points  Count back from 20 0 points - 0 points  Months in reverse 0 points 0 points 0 points  Repeat phrase 2 points 0 points 2 points  Total Score 2 - 2    Immunizations Immunization History  Administered Date(s) Administered   Influenza,inj,Quad PF,6+ Mos 02/26/2016   Moderna Sars-Covid-2 Vaccination 07/20/2019, 08/22/2019   Tdap 09/17/2015    TDAP status: Up to date  Flu Vaccine status: Declined, Education has been provided regarding the importance of this vaccine but patient still declined. Advised may receive this vaccine at local pharmacy or Health Dept. Aware to provide a copy of the vaccination record if obtained from local pharmacy or Health Dept. Verbalized acceptance and understanding.  Pneumococcal vaccine status: Declined,  Education has been provided regarding the importance of this vaccine but patient still declined. Advised may receive this vaccine at local pharmacy or Health Dept. Aware to provide a copy of the vaccination record if obtained from local pharmacy or Health Dept. Verbalized acceptance and understanding.    Covid-19 vaccine status: Information provided on how to obtain vaccines.   Qualifies for Shingles Vaccine? Yes   Zostavax completed No   Shingrix Completed?: No.    Education has been provided regarding the importance of this vaccine. Patient has been advised to call insurance company to determine out of pocket expense if they have not yet received this vaccine. Advised may also receive vaccine at local pharmacy or Health Dept. Verbalized acceptance and understanding.  Screening Tests Health Maintenance  Topic Date Due   Zoster Vaccines- Shingrix (1 of 2) Never done   COVID-19 Vaccine (3 - Moderna risk series) 09/19/2019   URINE MICROALBUMIN  04/26/2020  INFLUENZA VACCINE  08/15/2021 (Originally 12/16/2020)   PNA vac Low Risk Adult (1 of 2 - PCV13) 09/29/2026 (Originally 10/12/2015)   OPHTHALMOLOGY EXAM  05/22/2021   HEMOGLOBIN A1C  05/27/2021   FOOT EXAM  08/19/2021   COLONOSCOPY (Pts 45-53yr Insurance coverage will need to be confirmed)  09/30/2024   TETANUS/TDAP  09/16/2025   Hepatitis C Screening  Completed   HPV VACCINES  Aged Out    Health Maintenance  Health Maintenance Due  Topic Date Due   Zoster Vaccines- Shingrix (1 of 2) Never done   COVID-19 Vaccine (3 - Moderna risk series) 09/19/2019   URINE MICROALBUMIN  04/26/2020    Colorectal cancer screening: Type of screening: Colonoscopy. Completed 10/01/2014. Repeat every 10 years  Lung Cancer Screening: (Low Dose CT Chest recommended if Age 70-80years, 30 pack-year currently smoking OR have quit w/in 15years.) does qualify.   Lung Cancer Screening Referral:  patient wants to postpone at this time until his leg heals  Additional Screening:  Hepatitis C Screening: does qualify; Completed 08/26/2016  Vision Screening: Recommended annual ophthalmology exams for early detection of glaucoma and other disorders of the eye. Is the patient up to date with their annual eye exam?  Yes  Who is the provider or what is the name  of the office in which the patient attends annual eye exams[place] is not established with a provider, would they like to be referred to a provider to establish care? No .   Dental Screening: Recommended annual dental exams for proper oral hygiene  Community Resource Referral / Chronic Care Management: CRR required this visit?  No   CCM required this visit?  No      Plan:     I have personally reviewed and noted the following in the patient's chart:   Medical and social history Use of alcohol, tobacco or illicit drugs  Current medications and supplements including opioid prescriptions. Patient is currently taking opioid prescriptions. Information provided to patient regarding non-opioid alternatives. Patient advised to discuss non-opioid treatment plan with their provider. Functional ability and status Nutritional status Physical activity Advanced directives List of other physicians Hospitalizations, surgeries, and ER visits in previous 12 months Vitals Screenings to include cognitive, depression, and falls Referrals and appointments  In addition, I have reviewed and discussed with patient certain preventive protocols, quality metrics, and best practice recommendations. A written personalized care plan for preventive services as well as general preventive health recommendations were provided to patient.     ASandrea Hammond LPN   [id number]  Nurse Notes: None

## 2021-01-06 NOTE — Patient Instructions (Signed)
Mr. Jaquay , Thank you for taking time to come for your Medicare Wellness Visit. I appreciate your ongoing commitment to your health goals. Please review the following plan we discussed and let me know if I can assist you in the future.   Screening recommendations/referrals: Colonoscopy: Done 10/01/2014 - Repeat in 10 years Recommended yearly ophthalmology/optometry visit for glaucoma screening and checkup Recommended yearly dental visit for hygiene and checkup  Vaccinations: Influenza vaccine: Due every fall Pneumococcal vaccine: Due (2 doses one year apart) Tdap vaccine: Done 09/17/2015 - Repeat in 10 years Shingles vaccine: Due. Shingrix discussed. Please contact your pharmacy for coverage information.     Covid-19: Done 07/20/19 & 08/22/19 - due for booster  Advanced directives: Please bring a copy of your health care power of attorney and living will to the office to be added to your chart at your convenience.   Conditions/risks identified: Aim for 30 minutes of exercise or brisk walking each day, drink 6-8 glasses of water and eat lots of fruits and vegetables.   Next appointment: Follow up in one year for your annual wellness visit.   Preventive Care 17 Years and Older, Male  Preventive care refers to lifestyle choices and visits with your health care provider that can promote health and wellness. What does preventive care include? A yearly physical exam. This is also called an annual well check. Dental exams once or twice a year. Routine eye exams. Ask your health care provider how often you should have your eyes checked. Personal lifestyle choices, including: Daily care of your teeth and gums. Regular physical activity. Eating a healthy diet. Avoiding tobacco and drug use. Limiting alcohol use. Practicing safe sex. Taking low doses of aspirin every day. Taking vitamin and mineral supplements as recommended by your health care provider. What happens during an annual well  check? The services and screenings done by your health care provider during your annual well check will depend on your age, overall health, lifestyle risk factors, and family history of disease. Counseling  Your health care provider may ask you questions about your: Alcohol use. Tobacco use. Drug use. Emotional well-being. Home and relationship well-being. Sexual activity. Eating habits. History of falls. Memory and ability to understand (cognition). Work and work Statistician. Screening  You may have the following tests or measurements: Height, weight, and BMI. Blood pressure. Lipid and cholesterol levels. These may be checked every 5 years, or more frequently if you are over 61 years old. Skin check. Lung cancer screening. You may have this screening every year starting at age 75 if you have a 30-pack-year history of smoking and currently smoke or have quit within the past 15 years. Fecal occult blood test (FOBT) of the stool. You may have this test every year starting at age 63. Flexible sigmoidoscopy or colonoscopy. You may have a sigmoidoscopy every 5 years or a colonoscopy every 10 years starting at age 11. Prostate cancer screening. Recommendations will vary depending on your family history and other risks. Hepatitis C blood test. Hepatitis B blood test. Sexually transmitted disease (STD) testing. Diabetes screening. This is done by checking your blood sugar (glucose) after you have not eaten for a while (fasting). You may have this done every 1-3 years. Abdominal aortic aneurysm (AAA) screening. You may need this if you are a current or former smoker. Osteoporosis. You may be screened starting at age 63 if you are at high risk. Talk with your health care provider about your test results, treatment options, and  if necessary, the need for more tests. Vaccines  Your health care provider may recommend certain vaccines, such as: Influenza vaccine. This is recommended every  year. Tetanus, diphtheria, and acellular pertussis (Tdap, Td) vaccine. You may need a Td booster every 10 years. Zoster vaccine. You may need this after age 15. Pneumococcal 13-valent conjugate (PCV13) vaccine. One dose is recommended after age 76. Pneumococcal polysaccharide (PPSV23) vaccine. One dose is recommended after age 19. Talk to your health care provider about which screenings and vaccines you need and how often you need them. This information is not intended to replace advice given to you by your health care provider. Make sure you discuss any questions you have with your health care provider. Document Released: 05/31/2015 Document Revised: 01/22/2016 Document Reviewed: 03/05/2015 Elsevier Interactive Patient Education  2017 [employer] Prevention in the Home Falls can cause injuries. They can happen to people of all ages. There are many things you can do to make your home safe and to help prevent falls. What can I do on the outside of my home? Regularly fix the edges of walkways and driveways and fix any cracks. Remove anything that might make you trip as you walk through a door, such as a raised step or threshold. Trim any bushes or trees on the path to your home. Use bright outdoor lighting. Clear any walking paths of anything that might make someone trip, such as rocks or tools. Regularly check to see if handrails are loose or broken. Make sure that both sides of any steps have handrails. Any raised decks and porches should have guardrails on the edges. Have any leaves, snow, or ice cleared regularly. Use sand or salt on walking paths during winter. Clean up any spills in your garage right away. This includes oil or grease spills. What can I do in the bathroom? Use night lights. Install grab bars by the toilet and in the tub and shower. Do not use towel bars as grab bars. Use non-skid mats or decals in the tub or shower. If you need to sit down in the shower, use a  plastic, non-slip stool. Keep the floor dry. Clean up any water that spills on the floor as soon as it happens. Remove soap buildup in the tub or shower regularly. Attach bath mats securely with double-sided non-slip rug tape. Do not have throw rugs and other things on the floor that can make you trip. What can I do in the bedroom? Use night lights. Make sure that you have a light by your bed that is easy to reach. Do not use any sheets or blankets that are too big for your bed. They should not hang down onto the floor. Have a firm chair that has side arms. You can use this for support while you get dressed. Do not have throw rugs and other things on the floor that can make you trip. What can I do in the kitchen? Clean up any spills right away. Avoid walking on wet floors. Keep items that you use a lot in easy-to-reach places. If you need to reach something above you, use a strong step stool that has a grab bar. Keep electrical cords out of the way. Do not use floor polish or wax that makes floors slippery. If you must use wax, use non-skid floor wax. Do not have throw rugs and other things on the floor that can make you trip. What can I do with my stairs? Do not leave any  items on the stairs. Make sure that there are handrails on both sides of the stairs and use them. Fix handrails that are broken or loose. Make sure that handrails are as long as the stairways. Check any carpeting to make sure that it is firmly attached to the stairs. Fix any carpet that is loose or worn. Avoid having throw rugs at the top or bottom of the stairs. If you do have throw rugs, attach them to the floor with carpet tape. Make sure that you have a light switch at the top of the stairs and the bottom of the stairs. If you do not have them, ask someone to add them for you. What else can I do to help prevent falls? Wear shoes that: Do not have high heels. Have rubber bottoms. Are comfortable and fit you  well. Are closed at the toe. Do not wear sandals. If you use a stepladder: Make sure that it is fully opened. Do not climb a closed stepladder. Make sure that both sides of the stepladder are locked into place. Ask someone to hold it for you, if possible. Clearly mark and make sure that you can see: Any grab bars or handrails. First and last steps. Where the edge of each step is. Use tools that help you move around (mobility aids) if they are needed. These include: Canes. Walkers. Scooters. Crutches. Turn on the lights when you go into a dark area. Replace any light bulbs as soon as they burn out. Set up your furniture so you have a clear path. Avoid moving your furniture around. If any of your floors are uneven, fix them. If there are any pets around you, be aware of where they are. Review your medicines with your doctor. Some medicines can make you feel dizzy. This can increase your chance of falling. Ask your doctor what other things that you can do to help prevent falls. This information is not intended to replace advice given to you by your health care provider. Make sure you discuss any questions you have with your health care provider. Document Released: 02/28/2009 Document Revised: 10/10/2015 Document Reviewed: 06/08/2014 Elsevier Interactive Patient Education  2017 [employer].

## 2021-01-13 ENCOUNTER — Other Ambulatory Visit: Payer: Self-pay

## 2021-01-13 ENCOUNTER — Encounter: Payer: Self-pay | Admitting: Surgery

## 2021-01-13 ENCOUNTER — Ambulatory Visit (INDEPENDENT_AMBULATORY_CARE_PROVIDER_SITE_OTHER): Payer: Medicare Other | Admitting: Surgery

## 2021-01-13 VITALS — BP 152/85 | HR 80 | Temp 97.3°F | Resp 18 | Ht 74.0 in | Wt 298.0 lb

## 2021-01-13 DIAGNOSIS — I70213 Atherosclerosis of native arteries of extremities with intermittent claudication, bilateral legs: Secondary | ICD-10-CM

## 2021-01-13 DIAGNOSIS — M5459 Other low back pain: Secondary | ICD-10-CM | POA: Diagnosis not present

## 2021-01-13 DIAGNOSIS — M48061 Spinal stenosis, lumbar region without neurogenic claudication: Secondary | ICD-10-CM | POA: Diagnosis not present

## 2021-01-13 DIAGNOSIS — M4726 Other spondylosis with radiculopathy, lumbar region: Secondary | ICD-10-CM | POA: Diagnosis not present

## 2021-01-13 DIAGNOSIS — G894 Chronic pain syndrome: Secondary | ICD-10-CM | POA: Diagnosis not present

## 2021-01-13 DIAGNOSIS — M5136 Other intervertebral disc degeneration, lumbar region: Secondary | ICD-10-CM | POA: Diagnosis not present

## 2021-01-13 NOTE — Progress Notes (Signed)
Patient name: [name] [name] MRN: [id number] DOB: 20-Dec-1950 Sex: male  REASON FOR VISIT:     Wound check  HISTORY OF PRESENT ILLNESS:   [name] [name] is a 70 y.o. male with bilateral lower extremity wounds who underwent left femoral to above-knee popliteal artery bypass graft with vein and right superficial femoral artery stenting on 11/22/2020.  He states that he now has a feeling of his feet.  He was last seen 2 weeks ago with separation of the above-knee incision.  He is back for wound check.   He has history of coronary artery disease.  He underwent DES in 2010 in 2018.  He suffers from atrial fibrillation.  He is on Eliquis and has a loop recorder implanted.  He is statin and Repatha intolerant.  He is currently involved in a research study to address his hypercholesterolemia.  He is a diabetic CURRENT MEDICATIONS:    Current Outpatient Medications  Medication Sig Dispense Refill   albuterol (VENTOLIN HFA) 108 (90 Base) MCG/ACT inhaler Inhale 2 puffs into the lungs every 6 (six) hours as needed for wheezing or shortness of breath. 8.5 g 3   apixaban (ELIQUIS) 5 MG TABS tablet Take 1 tablet (5 mg total) by mouth 2 (two) times daily. 180 tablet 1   clopidogrel (PLAVIX) 75 MG tablet Take 1 tablet by mouth once daily 90 tablet 0   dapagliflozin propanediol (FARXIGA) 10 MG TABS tablet Take 1 tablet (10 mg total) by mouth daily before breakfast. 90 tablet 3   DULoxetine (CYMBALTA) 30 MG capsule Take 1 capsule by mouth once daily 90 capsule 0   ezetimibe (ZETIA) 10 MG tablet Take 1 tablet (10 mg total) by mouth daily. 90 tablet 3   fenofibrate 160 MG tablet TAKE 1 TABLET BY MOUTH ONCE DAILY FOR CHOLESTEROL AND TRIGLYCERIDES 90 tablet 3   furosemide (LASIX) 20 MG tablet Take Two Times Daily for 7 Days then take Daily 97 tablet 3   gabapentin (NEURONTIN) 800 MG tablet Take 1 tablet (800 mg total) by mouth 4 (four) times daily as needed. 120 tablet 2    HYDROcodone-acetaminophen (NORCO) 5-325 MG tablet Take 1-2 tablets by mouth every 6 (six) hours as needed for moderate pain. 20 tablet 0   insulin lispro (HUMALOG KWIKPEN) 100 UNIT/ML KwikPen Inject 15-20 Units into the skin 3 (three) times daily with meals. 15 mL 11   insulin NPH-regular Human (70-30) 100 UNIT/ML injection Inject 60 Units into the skin 2 (two) times daily with a meal.     isosorbide mononitrate (IMDUR) 30 MG 24 hr tablet Take 1 tablet by mouth once daily 90 tablet 0   Menthol, Topical Analgesic, (BLUE-EMU MAXIMUM STRENGTH EX) Apply 1 application topically 2 (two) times daily.     metoprolol succinate (TOPROL-XL) 50 MG 24 hr tablet Take 1 tablet by mouth once daily 90 tablet 3   metoprolol tartrate (LOPRESSOR) 50 MG tablet Take 50 mg by mouth daily as needed (a-fib).     nitroGLYCERIN (NITROSTAT) 0.4 MG SL tablet DISSOLVE ONE TABLET UNDER THE TONGUE EVERY 5 MINUTES AS NEEDED FOR CHEST PAIN.  DO NOT EXCEED A TOTAL OF 3 DOSES IN 15 MINUTES 25 tablet 0   pantoprazole (PROTONIX) 40 MG tablet Take 1 tablet by mouth once daily 90 tablet 1   triamcinolone (NASACORT) 55 MCG/ACT AERO nasal inhaler Place 1 spray into the nose as needed (allergies).     vitamin B-12 (CYANOCOBALAMIN) 1000 MCG tablet Take 1,000 mcg by mouth daily.  Chlorphen-Phenyleph-ASA (ALKA-SELTZER PLUS COLD PO) Take 2 tablets by mouth daily as needed (allergies). (Patient not taking: No sig reported)     HYDROcodone-acetaminophen (NORCO) 5-325 MG tablet Take 1 tablet by mouth every 6 (six) hours as needed for moderate pain. (Patient not taking: No sig reported) 30 tablet 0   linaclotide (LINZESS) 72 MCG capsule Take 1 capsule (72 mcg total) by mouth daily as needed (constipation). (Patient not taking: No sig reported) 90 capsule 3   Semaglutide, 1 MG/DOSE, (OZEMPIC, 1 MG/DOSE,) 2 MG/1.5ML SOPN Inject 0.75 mLs (1 mg total) into the skin once a week. (Patient not taking: No sig reported) 12 mL 4   No current  facility-administered medications for this visit.    REVIEW OF SYSTEMS:   '[X]'$  denotes positive finding, '[ ]'$  denotes negative finding Cardiac  Comments:  Chest pain or chest pressure:    Shortness of breath upon exertion:    Short of breath when lying flat:    Irregular heart rhythm:    Constitutional    Fever or chills:      PHYSICAL EXAM:   Vitals:   01/13/21 1355  BP: (!) 152/85  Pulse: 80  Resp: 18  Temp: (!) 97.3 F (36.3 C)  TempSrc: Temporal  SpO2: 98%  Weight: 298 lb (135.2 kg)  Height: '6\' 2"'$  (1.88 m)    GENERAL: The patient is a well-nourished male, in no acute distress. The vital signs are documented above. CARDIOVASCULAR: There is a regular rate and rhythm. PULMONARY: Non-labored respirations I debrided and remove retained suture from the above-knee popliteal incision.  There was healthy tissue underneath with no obvious infection  STUDIES:   Postoperative ABIs did not appear changed.   MEDICAL ISSUES:   Patient will continue with wet-to-dry dressing changes of his above-knee incision.  Because of his recent ABIs that are essentially unchanged after bypass on the left and stenting on the right, I feel like this needs to be evaluated with angiography.  I will get this done within the next few weeks.  He will need to be off of his Eliquis.  I will plan on a right femoral artery access and intervention as indicated.  He will continue his Plavix given his SFA stent.  [name] Alf, MD, FACS [facility] [phone] Pager [phone]

## 2021-01-13 NOTE — H&P (View-Only) (Signed)
Patient name: [name] [name] MRN: [id number] DOB: February 14, 1951 Sex: male  REASON FOR VISIT:     Wound check  HISTORY OF PRESENT ILLNESS:   [name] [name] is a 70 y.o. male with bilateral lower extremity wounds who underwent left femoral to above-knee popliteal artery bypass graft with vein and right superficial femoral artery stenting on 11/22/2020.  He states that he now has a feeling of his feet.  He was last seen 2 weeks ago with separation of the above-knee incision.  He is back for wound check.   He has history of coronary artery disease.  He underwent DES in 2010 in 2018.  He suffers from atrial fibrillation.  He is on Eliquis and has a loop recorder implanted.  He is statin and Repatha intolerant.  He is currently involved in a research study to address his hypercholesterolemia.  He is a diabetic CURRENT MEDICATIONS:    Current Outpatient Medications  Medication Sig Dispense Refill   albuterol (VENTOLIN HFA) 108 (90 Base) MCG/ACT inhaler Inhale 2 puffs into the lungs every 6 (six) hours as needed for wheezing or shortness of breath. 8.5 g 3   apixaban (ELIQUIS) 5 MG TABS tablet Take 1 tablet (5 mg total) by mouth 2 (two) times daily. 180 tablet 1   clopidogrel (PLAVIX) 75 MG tablet Take 1 tablet by mouth once daily 90 tablet 0   dapagliflozin propanediol (FARXIGA) 10 MG TABS tablet Take 1 tablet (10 mg total) by mouth daily before breakfast. 90 tablet 3   DULoxetine (CYMBALTA) 30 MG capsule Take 1 capsule by mouth once daily 90 capsule 0   ezetimibe (ZETIA) 10 MG tablet Take 1 tablet (10 mg total) by mouth daily. 90 tablet 3   fenofibrate 160 MG tablet TAKE 1 TABLET BY MOUTH ONCE DAILY FOR CHOLESTEROL AND TRIGLYCERIDES 90 tablet 3   furosemide (LASIX) 20 MG tablet Take Two Times Daily for 7 Days then take Daily 97 tablet 3   gabapentin (NEURONTIN) 800 MG tablet Take 1 tablet (800 mg total) by mouth 4 (four) times daily as needed. 120 tablet 2    HYDROcodone-acetaminophen (NORCO) 5-325 MG tablet Take 1-2 tablets by mouth every 6 (six) hours as needed for moderate pain. 20 tablet 0   insulin lispro (HUMALOG KWIKPEN) 100 UNIT/ML KwikPen Inject 15-20 Units into the skin 3 (three) times daily with meals. 15 mL 11   insulin NPH-regular Human (70-30) 100 UNIT/ML injection Inject 60 Units into the skin 2 (two) times daily with a meal.     isosorbide mononitrate (IMDUR) 30 MG 24 hr tablet Take 1 tablet by mouth once daily 90 tablet 0   Menthol, Topical Analgesic, (BLUE-EMU MAXIMUM STRENGTH EX) Apply 1 application topically 2 (two) times daily.     metoprolol succinate (TOPROL-XL) 50 MG 24 hr tablet Take 1 tablet by mouth once daily 90 tablet 3   metoprolol tartrate (LOPRESSOR) 50 MG tablet Take 50 mg by mouth daily as needed (a-fib).     nitroGLYCERIN (NITROSTAT) 0.4 MG SL tablet DISSOLVE ONE TABLET UNDER THE TONGUE EVERY 5 MINUTES AS NEEDED FOR CHEST PAIN.  DO NOT EXCEED A TOTAL OF 3 DOSES IN 15 MINUTES 25 tablet 0   pantoprazole (PROTONIX) 40 MG tablet Take 1 tablet by mouth once daily 90 tablet 1   triamcinolone (NASACORT) 55 MCG/ACT AERO nasal inhaler Place 1 spray into the nose as needed (allergies).     vitamin B-12 (CYANOCOBALAMIN) 1000 MCG tablet Take 1,000 mcg by mouth daily.  Chlorphen-Phenyleph-ASA (ALKA-SELTZER PLUS COLD PO) Take 2 tablets by mouth daily as needed (allergies). (Patient not taking: No sig reported)     HYDROcodone-acetaminophen (NORCO) 5-325 MG tablet Take 1 tablet by mouth every 6 (six) hours as needed for moderate pain. (Patient not taking: No sig reported) 30 tablet 0   linaclotide (LINZESS) 72 MCG capsule Take 1 capsule (72 mcg total) by mouth daily as needed (constipation). (Patient not taking: No sig reported) 90 capsule 3   Semaglutide, 1 MG/DOSE, (OZEMPIC, 1 MG/DOSE,) 2 MG/1.5ML SOPN Inject 0.75 mLs (1 mg total) into the skin once a week. (Patient not taking: No sig reported) 12 mL 4   No current  facility-administered medications for this visit.    REVIEW OF SYSTEMS:   '[X]'$  denotes positive finding, '[ ]'$  denotes negative finding Cardiac  Comments:  Chest pain or chest pressure:    Shortness of breath upon exertion:    Short of breath when lying flat:    Irregular heart rhythm:    Constitutional    Fever or chills:      PHYSICAL EXAM:   Vitals:   01/13/21 1355  BP: (!) 152/85  Pulse: 80  Resp: 18  Temp: (!) 97.3 F (36.3 C)  TempSrc: Temporal  SpO2: 98%  Weight: 298 lb (135.2 kg)  Height: '6\' 2"'$  (1.88 m)    GENERAL: The patient is a well-nourished male, in no acute distress. The vital signs are documented above. CARDIOVASCULAR: There is a regular rate and rhythm. PULMONARY: Non-labored respirations I debrided and remove retained suture from the above-knee popliteal incision.  There was healthy tissue underneath with no obvious infection  STUDIES:   Postoperative ABIs did not appear changed.   MEDICAL ISSUES:   Patient will continue with wet-to-dry dressing changes of his above-knee incision.  Because of his recent ABIs that are essentially unchanged after bypass on the left and stenting on the right, I feel like this needs to be evaluated with angiography.  I will get this done within the next few weeks.  He will need to be off of his Eliquis.  I will plan on a right femoral artery access and intervention as indicated.  He will continue his Plavix given his SFA stent.  Leia Alf, MD, FACS [facility] [phone] Pager [phone]

## 2021-01-14 ENCOUNTER — Ambulatory Visit: Payer: Medicare Other | Admitting: Physical Therapy

## 2021-01-14 ENCOUNTER — Other Ambulatory Visit: Payer: Self-pay

## 2021-01-14 ENCOUNTER — Ambulatory Visit: Payer: Medicare Other | Admitting: Family

## 2021-01-14 NOTE — Therapy (Signed)
[facility] [place], [name], [address] Phone: [phone]   Fax:  [phone]  Patient Details  Name: [name] [name] MRN: [id number] Date of Birth: [birth year] Referring Provider:  [name], FNP  Encounter Date: 01/14/2021 Patient fell off a stool at a recent visit to the beach.  He was also out most of the day yesterday including two MD appointments.  He also had a dressing change over his left medial knee incisional site.  He did not feel up to participating in PT today.  Jaskirat Schwieger, [place] MPT 01/14/2021, 11:01 AM  [facility] [address] [place], [name], [address] Phone: [phone]   Fax:  [phone]

## 2021-01-15 ENCOUNTER — Other Ambulatory Visit: Payer: Self-pay

## 2021-01-20 ENCOUNTER — Other Ambulatory Visit: Payer: Self-pay | Admitting: Family

## 2021-01-20 DIAGNOSIS — E1142 Type 2 diabetes mellitus with diabetic polyneuropathy: Secondary | ICD-10-CM

## 2021-01-20 DIAGNOSIS — F331 Major depressive disorder, recurrent, moderate: Secondary | ICD-10-CM

## 2021-01-21 ENCOUNTER — Ambulatory Visit: Payer: Medicare Other | Admitting: Physical Therapy

## 2021-01-27 ENCOUNTER — Other Ambulatory Visit: Payer: Self-pay | Admitting: Family

## 2021-01-27 DIAGNOSIS — I2510 Atherosclerotic heart disease of native coronary artery without angina pectoris: Secondary | ICD-10-CM

## 2021-01-28 ENCOUNTER — Ambulatory Visit (HOSPITAL_COMMUNITY)
Admission: RE | Admit: 2021-01-28 | Discharge: 2021-01-28 | Disposition: A | Discharge status: Home / Self Care | Payer: Medicare Other | Source: Ambulatory Visit | Attending: Surgery | Admitting: Surgery

## 2021-01-28 ENCOUNTER — Encounter (HOSPITAL_COMMUNITY)
Admission: RE | Disposition: A | Discharge status: Home / Self Care | Payer: Self-pay | Source: Ambulatory Visit | Attending: Surgery

## 2021-01-28 ENCOUNTER — Encounter (HOSPITAL_COMMUNITY): Payer: Self-pay | Admitting: Surgery

## 2021-01-28 DIAGNOSIS — I4891 Unspecified atrial fibrillation: Secondary | ICD-10-CM | POA: Insufficient documentation

## 2021-01-28 DIAGNOSIS — Z794 Long term (current) use of insulin: Secondary | ICD-10-CM | POA: Insufficient documentation

## 2021-01-28 DIAGNOSIS — Z7984 Long term (current) use of oral hypoglycemic drugs: Secondary | ICD-10-CM | POA: Insufficient documentation

## 2021-01-28 DIAGNOSIS — Z7902 Long term (current) use of antithrombotics/antiplatelets: Secondary | ICD-10-CM | POA: Diagnosis not present

## 2021-01-28 DIAGNOSIS — E1151 Type 2 diabetes mellitus with diabetic peripheral angiopathy without gangrene: Secondary | ICD-10-CM | POA: Diagnosis not present

## 2021-01-28 DIAGNOSIS — I70213 Atherosclerosis of native arteries of extremities with intermittent claudication, bilateral legs: Secondary | ICD-10-CM | POA: Diagnosis not present

## 2021-01-28 DIAGNOSIS — Z7901 Long term (current) use of anticoagulants: Secondary | ICD-10-CM | POA: Diagnosis not present

## 2021-01-28 DIAGNOSIS — Z79899 Other long term (current) drug therapy: Secondary | ICD-10-CM | POA: Insufficient documentation

## 2021-01-28 DIAGNOSIS — E7800 Pure hypercholesterolemia, unspecified: Secondary | ICD-10-CM | POA: Insufficient documentation

## 2021-01-28 DIAGNOSIS — I2510 Atherosclerotic heart disease of native coronary artery without angina pectoris: Secondary | ICD-10-CM | POA: Diagnosis not present

## 2021-01-28 HISTORY — PX: ABDOMINAL AORTOGRAM W/LOWER EXTREMITY: CATH118223

## 2021-01-28 LAB — POCT I-STAT, CHEM 8
BUN: 29 mg/dL — ABNORMAL HIGH (ref 8–23)
Calcium, Ion: 1.32 mmol/L (ref 1.15–1.40)
Chloride: 98 mmol/L (ref 98–111)
Creatinine, Ser: 1.4 mg/dL — ABNORMAL HIGH (ref 0.61–1.24)
Glucose, Bld: 450 mg/dL — ABNORMAL HIGH (ref 70–99)
HCT: 38 % — ABNORMAL LOW (ref 39.0–52.0)
Hemoglobin: 12.9 g/dL — ABNORMAL LOW (ref 13.0–17.0)
Potassium: 4.6 mmol/L (ref 3.5–5.1)
Sodium: 133 mmol/L — ABNORMAL LOW (ref 135–145)
TCO2: 28 mmol/L (ref 22–32)

## 2021-01-28 LAB — GLUCOSE, CAPILLARY
Glucose-Capillary: 296 mg/dL — ABNORMAL HIGH (ref 70–99)
Glucose-Capillary: 356 mg/dL — ABNORMAL HIGH (ref 70–99)

## 2021-01-28 SURGERY — ABDOMINAL AORTOGRAM W/LOWER EXTREMITY
Anesthesia: LOCAL

## 2021-01-28 MED ORDER — FENTANYL CITRATE (PF) 100 MCG/2ML IJ SOLN
INTRAMUSCULAR | Status: DC | PRN
  Administered 2021-01-28: 50 ug via INTRAVENOUS

## 2021-01-28 MED ORDER — INSULIN ASPART 100 UNIT/ML IJ SOLN
INTRAMUSCULAR | Status: AC
  Filled 2021-01-28: qty 1

## 2021-01-28 MED ORDER — MORPHINE SULFATE (PF) 2 MG/ML IV SOLN
2.0000 mg | INTRAVENOUS | Status: DC | PRN
Start: 2021-01-28 — End: 2021-01-28

## 2021-01-28 MED ORDER — SODIUM CHLORIDE 0.9% FLUSH: 3.0000 mL | Freq: Two times a day (BID) | INTRAVENOUS | Status: DC

## 2021-01-28 MED ORDER — OXYCODONE HCL 5 MG PO TABS: 5.0000 mg | ORAL_TABLET | ORAL | Status: DC | PRN

## 2021-01-28 MED ORDER — SODIUM CHLORIDE 0.9 % IV SOLN: INTRAVENOUS | Status: DC

## 2021-01-28 MED ORDER — HYDRALAZINE HCL 20 MG/ML IJ SOLN: 5.0000 mg | INTRAMUSCULAR | Status: DC | PRN

## 2021-01-28 MED ORDER — HEPARIN (PORCINE) IN NACL 1000-0.9 UT/500ML-% IV SOLN
INTRAVENOUS | Status: AC
  Filled 2021-01-28: qty 500

## 2021-01-28 MED ORDER — ONDANSETRON HCL 4 MG/2ML IJ SOLN: 4.0000 mg | Freq: Four times a day (QID) | INTRAMUSCULAR | Status: DC | PRN

## 2021-01-28 MED ORDER — HEPARIN (PORCINE) IN NACL 1000-0.9 UT/500ML-% IV SOLN
INTRAVENOUS | Status: DC | PRN
  Administered 2021-01-28 (×2): 500 mL

## 2021-01-28 MED ORDER — SODIUM CHLORIDE 0.9 % IV SOLN: 250.0000 mL | INTRAVENOUS | Status: DC | PRN

## 2021-01-28 MED ORDER — INSULIN ASPART 100 UNIT/ML IJ SOLN
15.0000 [IU] | Freq: Once | INTRAMUSCULAR | Status: AC
  Administered 2021-01-28: 15 [IU] via SUBCUTANEOUS
  Filled 2021-01-28: qty 0.15

## 2021-01-28 MED ORDER — LIDOCAINE HCL (PF) 1 % IJ SOLN
INTRAMUSCULAR | Status: AC
  Filled 2021-01-28: qty 30

## 2021-01-28 MED ORDER — FENTANYL CITRATE (PF) 100 MCG/2ML IJ SOLN
INTRAMUSCULAR | Status: AC
  Filled 2021-01-28: qty 2

## 2021-01-28 MED ORDER — INSULIN ASPART 100 UNIT/ML IJ SOLN
15.0000 [IU] | Freq: Once | INTRAMUSCULAR | Status: AC
  Administered 2021-01-28: 15 [IU] via SUBCUTANEOUS

## 2021-01-28 MED ORDER — MIDAZOLAM HCL 2 MG/2ML IJ SOLN
INTRAMUSCULAR | Status: AC
  Filled 2021-01-28: qty 2

## 2021-01-28 MED ORDER — ACETAMINOPHEN 325 MG PO TABS: 650.0000 mg | ORAL_TABLET | ORAL | Status: DC | PRN

## 2021-01-28 MED ORDER — LABETALOL HCL 5 MG/ML IV SOLN: 10.0000 mg | INTRAVENOUS | Status: DC | PRN

## 2021-01-28 MED ORDER — IODIXANOL 320 MG/ML IV SOLN
INTRAVENOUS | Status: DC | PRN
  Administered 2021-01-28: 144 mL via INTRA_ARTERIAL

## 2021-01-28 MED ORDER — MIDAZOLAM HCL 2 MG/2ML IJ SOLN
INTRAMUSCULAR | Status: DC | PRN
  Administered 2021-01-28: 2 mg via INTRAVENOUS

## 2021-01-28 MED ORDER — LIDOCAINE HCL (PF) 1 % IJ SOLN
INTRAMUSCULAR | Status: DC | PRN
  Administered 2021-01-28: 15 mL via INTRADERMAL

## 2021-01-28 MED ORDER — SODIUM CHLORIDE 0.9 % WEIGHT BASED INFUSION
1.0000 mL/kg/h | INTRAVENOUS | Status: DC
  Administered 2021-01-28: 1 mL/kg/h via INTRAVENOUS

## 2021-01-28 MED ORDER — SODIUM CHLORIDE 0.9% FLUSH: 3.0000 mL | INTRAVENOUS | Status: DC | PRN

## 2021-01-28 SURGICAL SUPPLY — 12 items
CATH ANGIO 5F PIGTAIL 65CM (CATHETERS) ×1 IMPLANT
CATH CROSS OVER TEMPO 5F (CATHETERS) ×1 IMPLANT
CATH SOFT-VU 4F 65 STRAIGHT (CATHETERS) IMPLANT
CATH SOFT-VU STRAIGHT 4F 65CM (CATHETERS) ×2
KIT MICROPUNCTURE NIT STIFF (SHEATH) ×1 IMPLANT
KIT PV (KITS) ×2 IMPLANT
SHEATH PINNACLE 5F 10CM (SHEATH) ×1 IMPLANT
SHEATH PROBE COVER 6X72 (BAG) ×1 IMPLANT
SYR MEDRAD MARK V 150ML (SYRINGE) ×1 IMPLANT
TRANSDUCER W/STOPCOCK (MISCELLANEOUS) ×2 IMPLANT
TRAY PV CATH (CUSTOM PROCEDURE TRAY) ×2 IMPLANT
WIRE STARTER BENTSON 035X150 (WIRE) ×1 IMPLANT

## 2021-01-28 NOTE — Op Note (Signed)
Patient name: [name] [name] MRN: [id number] DOB: 01/20/1951 Sex: male  01/28/2021 Pre-operative Diagnosis: Vilateral leg ulcers Post-operative diagnosis:  Same Surgeon:  Annamarie Major Procedure Performed:  1.  U/s guided access, right femoral artery  2.  Abdominal aortogram  3.  Second-order catheterization  4.  Bilateral lower extremity runoff  5.  Conscious sedation, 14mnutes   Indications: This is a 70year old gentleman who has recently undergone left femoral-popliteal bypass graft and right superficial femoral artery stent.  He did not have a significant change in his ABIs and continues to have ulcers.  Therefore he comes in today for angiography  Procedure:  The patient was identified in the holding area and taken to room 8.  The patient was then placed supine on the table and prepped and draped in the usual sterile fashion.  A time out was called.  Conscious sedation was administered with the use of IV fentanyl and Versed under continuous physician and nurse monitoring.  Heart rate, blood pressure, and oxygen saturation were continuously monitored.  Total sedation time was 215mutes.  Ultrasound was used to evaluate the right common femoral artery.  It was patent .  A digital ultrasound image was acquired.  A micropuncture needle was used to access the right common femoral artery under ultrasound guidance.  An 018 wire was advanced without resistance and a micropuncture sheath was placed.  The 018 wire was removed and a benson wire was placed.  The micropuncture sheath was exchanged for a 5 french sheath.  An omniflush catheter was advanced over the wire to the level of L-1.  An abdominal angiogram was obtained.  Next, using the omniflush catheter and a benson wire, the aortic bifurcation was crossed and the catheter was placed into theleft external iliac artery and left runoff was obtained.  right runoff was performed via retrograde sheath injections.  Findings:   Aortogram: No  significant renal artery stenosis.  The infrarenal abdominal aorta is widely patent.  There is no pressure gradient across the aorta.  Bilateral common and external iliac arteries are widely patent  Right Lower Extremity: Right common femoral profundofemoral artery widely patent.  The superficial femoral artery is patent.  The previously placed stent is widely patent.  There is mild to moderate stenosis and disease throughout the superficial femoral artery.  The popliteal artery is patent throughout its course but small in caliber.  There is occlusion of all 3 tibial vessels with reconstitution of the peroneal artery which is the single-vessel runoff and collateralizes to the posterior tibial at the ankle.  Left Lower Extremity: The left common femoral and profundofemoral artery are widely patent.  The superficial femoral artery is occluded.  There is a common femoral to above-knee popliteal artery bypass graft which is widely patent without stenosis.  The popliteal artery is faint throughout its course.  There is mild luminal narrowing at the origin of the tibioperoneal trunk.  The peroneal artery is the single-vessel runoff.  Diffuse disease is noted out on the foot.  Intervention: None  Impression:  #1  Widely patent left femoral-popliteal bypass graft.  There is single-vessel runoff via the peroneal artery with mild to moderate diffuse disease throughout the single-vessel peroneal runoff  #2  The right superficial femoral artery stent is widely patent.  There is occlusion of all 3 tibial vessels with reconstitution of the peroneal which collateralizes to the posterior tibial at the ankle.  If the patient continues to have difficulty, he could be brought back  for intervention on his right tibial vessels.  This would need to wait until he has healed his left groin incision.   Theotis Burrow, M.D., [facility] [place] Office: [phone] Pager:  [phone]

## 2021-01-28 NOTE — Discharge Instructions (Addendum)
Re-start Eliquis tomorrow 9-14

## 2021-01-28 NOTE — Interval H&P Note (Signed)
History and Physical Interval Note:  01/28/2021 7:33 AM  [name] [name]  has presented today for surgery, with the diagnosis of claudication.  The various methods of treatment have been discussed with the patient and family. After consideration of risks, benefits and other options for treatment, the patient has consented to  Procedure(s): ABDOMINAL AORTOGRAM W/LOWER EXTREMITY (N/A) as a surgical intervention.  The patient's history has been reviewed, patient examined, no change in status, stable for surgery.  I have reviewed the patient's chart and labs.  Questions were answered to the patient's satisfaction.     Annamarie Major

## 2021-01-28 NOTE — Progress Notes (Signed)
SITE AREA: right groin/femoral  SITE PRIOR TO REMOVAL:  LEVEL 0  PRESSURE APPLIED FOR: approximately 20 minutes, sheath removed by Eddie Dibbles, RN  MANUAL: yes  PATIENT STATUS DURING PULL: stable  POST PULL SITE:  LEVEL 0  POST PULL INSTRUCTIONS GIVEN: yes  POST PULL PULSES PRESENT: bilateral DP pulses dopplerable  DRESSING APPLIED: gauze with tegaderm  BEDREST BEGINS @ [id number]   COMMENTS:

## 2021-01-28 NOTE — Progress Notes (Signed)
Patient and wife was given discharge instructions. Both verbalized understanding. 

## 2021-02-03 DIAGNOSIS — H4312 Vitreous hemorrhage, left eye: Secondary | ICD-10-CM | POA: Diagnosis not present

## 2021-02-03 DIAGNOSIS — H2513 Age-related nuclear cataract, bilateral: Secondary | ICD-10-CM | POA: Diagnosis not present

## 2021-02-03 DIAGNOSIS — H35361 Drusen (degenerative) of macula, right eye: Secondary | ICD-10-CM | POA: Diagnosis not present

## 2021-02-03 DIAGNOSIS — E113493 Type 2 diabetes mellitus with severe nonproliferative diabetic retinopathy without macular edema, bilateral: Secondary | ICD-10-CM | POA: Diagnosis not present

## 2021-02-03 DIAGNOSIS — H43813 Vitreous degeneration, bilateral: Secondary | ICD-10-CM | POA: Diagnosis not present

## 2021-02-03 DIAGNOSIS — H33302 Unspecified retinal break, left eye: Secondary | ICD-10-CM | POA: Diagnosis not present

## 2021-02-03 DIAGNOSIS — E119 Type 2 diabetes mellitus without complications: Secondary | ICD-10-CM | POA: Diagnosis not present

## 2021-02-03 DIAGNOSIS — H43392 Other vitreous opacities, left eye: Secondary | ICD-10-CM | POA: Diagnosis not present

## 2021-02-03 DIAGNOSIS — H35373 Puckering of macula, bilateral: Secondary | ICD-10-CM | POA: Diagnosis not present

## 2021-02-06 DIAGNOSIS — M5459 Other low back pain: Secondary | ICD-10-CM | POA: Diagnosis not present

## 2021-02-06 DIAGNOSIS — M47816 Spondylosis without myelopathy or radiculopathy, lumbar region: Secondary | ICD-10-CM | POA: Diagnosis not present

## 2021-02-17 ENCOUNTER — Encounter: Payer: Self-pay | Admitting: Surgery

## 2021-02-17 ENCOUNTER — Ambulatory Visit (INDEPENDENT_AMBULATORY_CARE_PROVIDER_SITE_OTHER): Payer: Self-pay | Admitting: Surgery

## 2021-02-17 ENCOUNTER — Other Ambulatory Visit: Payer: Self-pay

## 2021-02-17 VITALS — BP 166/78 | HR 72 | Temp 98.2°F | Resp 20 | Ht 74.0 in | Wt 297.0 lb

## 2021-02-17 DIAGNOSIS — I7025 Atherosclerosis of native arteries of other extremities with ulceration: Secondary | ICD-10-CM

## 2021-02-17 NOTE — Progress Notes (Signed)
Patient name: [name] [name] MRN: [id number] DOB: Sep 15, 1950 Sex: male  REASON FOR VISIT:     Post op  HISTORY OF PRESENT ILLNESS:   [name] [name] is a 70 y.o. male with bilateral lower extremity wounds who underwent left femoral to above-knee popliteal artery bypass graft with vein and right superficial femoral artery stenting on 11/22/2020.  He states that he now has a feeling of his feet.  He has been doing wound care to a vein harvest incision which is improving.  At his postoperative visit, his ABIs were essentially unchanged and so I sent him for angiography.  This showed a widely patent femoral-popliteal bypass graft on the left with single-vessel runoff via the peroneal artery which was mild to moderately diseased.  The tibial vessels on the right side were all occluded.  He comes back in today for a wound check.  He has history of coronary artery disease.  He underwent DES in 2010 in 2018.  He suffers from atrial fibrillation.  He is on Eliquis and has a loop recorder implanted.  He is statin and Repatha intolerant.  He is currently involved in a research study to address his hypercholesterolemia.  He is a diabetic  CURRENT MEDICATIONS:    Current Outpatient Medications  Medication Sig Dispense Refill   albuterol (VENTOLIN HFA) 108 (90 Base) MCG/ACT inhaler Inhale 2 puffs into the lungs every 6 (six) hours as needed for wheezing or shortness of breath. 8.5 g 3   apixaban (ELIQUIS) 5 MG TABS tablet Take 1 tablet (5 mg total) by mouth 2 (two) times daily. 180 tablet 1   Chlorphen-Phenyleph-ASA (ALKA-SELTZER PLUS COLD PO) Take 2 tablets by mouth daily as needed (allergies).     clopidogrel (PLAVIX) 75 MG tablet Take 1 tablet by mouth once daily 90 tablet 0   dapagliflozin propanediol (FARXIGA) 10 MG TABS tablet Take 1 tablet (10 mg total) by mouth daily before breakfast. (Patient taking differently: Take 5 mg by mouth daily before breakfast.) 90 tablet 3    DULoxetine (CYMBALTA) 30 MG capsule Take 1 capsule by mouth once daily 90 capsule 0   ezetimibe (ZETIA) 10 MG tablet Take 1 tablet (10 mg total) by mouth daily. 90 tablet 3   fenofibrate 160 MG tablet TAKE 1 TABLET BY MOUTH ONCE DAILY FOR CHOLESTEROL AND TRIGLYCERIDES 90 tablet 3   furosemide (LASIX) 20 MG tablet Take Two Times Daily for 7 Days then take Daily (Patient taking differently: Take 20 mg by mouth daily as needed for fluid.) 97 tablet 3   gabapentin (NEURONTIN) 800 MG tablet Take 1 tablet (800 mg total) by mouth 4 (four) times daily as needed. (Patient taking differently: Take 800 mg by mouth 2 (two) times daily. Additional 400 mg if needed during the day) 120 tablet 2   HYDROcodone-acetaminophen (NORCO) 5-325 MG tablet Take 1-2 tablets by mouth every 6 (six) hours as needed for moderate pain. 20 tablet 0   insulin lispro (HUMALOG KWIKPEN) 100 UNIT/ML KwikPen Inject 15-20 Units into the skin 3 (three) times daily with meals. (Patient taking differently: Inject 15 Units into the skin 3 (three) times daily with meals. Sliding scale) 15 mL 11   insulin NPH-regular Human (70-30) 100 UNIT/ML injection Inject 60 Units into the skin 2 (two) times daily with a meal.     isosorbide mononitrate (IMDUR) 30 MG 24 hr tablet Take 1 tablet by mouth once daily 90 tablet 0   linaclotide (LINZESS) 72 MCG capsule Take 1 capsule (  72 mcg total) by mouth daily as needed (constipation). 90 capsule 3   Menthol, Topical Analgesic, (BLUE-EMU MAXIMUM STRENGTH EX) Apply 1 application topically daily as needed (pain).     metoprolol succinate (TOPROL-XL) 50 MG 24 hr tablet Take 1 tablet by mouth once daily 90 tablet 3   metoprolol tartrate (LOPRESSOR) 50 MG tablet Take 50 mg by mouth daily as needed (a-fib).     nitroGLYCERIN (NITROSTAT) 0.4 MG SL tablet DISSOLVE ONE TABLET UNDER THE TONGUE EVERY 5 MINUTES AS NEEDED FOR CHEST PAIN.  DO NOT EXCEED A TOTAL OF 3 DOSES IN 15 MINUTES 25 tablet 0   pantoprazole (PROTONIX) 40  MG tablet Take 1 tablet by mouth once daily 90 tablet 1   Semaglutide, 1 MG/DOSE, (OZEMPIC, 1 MG/DOSE,) 2 MG/1.5ML SOPN Inject 0.75 mLs (1 mg total) into the skin once a week. 12 mL 4   vitamin B-12 (CYANOCOBALAMIN) 1000 MCG tablet Take 1,000 mcg by mouth daily.     No current facility-administered medications for this visit.    REVIEW OF SYSTEMS:   [X]  denotes positive finding, [ ]  denotes negative finding Cardiac  Comments:  Chest pain or chest pressure:    Shortness of breath upon exertion:    Short of breath when lying flat:    Irregular heart rhythm:    Constitutional    Fever or chills:      PHYSICAL EXAM:   Vitals:   02/17/21 1458  BP: (!) 166/78  Pulse: 72  Resp: 20  Temp: 98.2 F (36.8 C)  SpO2: 96%  Weight: 297 lb (134.7 kg)  Height: 6\' 2"  (1.88 m)    GENERAL: The patient is a well-nourished male, in no acute distress. The vital signs are documented above. CARDIOVASCULAR: There is a regular rate and rhythm. PULMONARY: Non-labored respirations      STUDIES:       MEDICAL ISSUES:   The vein harvest wound is healing.  The groin is completely healed.  There has been slight improvement in the wounds on his left foot.  The right toe wound may be slightly better.  Overall, I feel he is stable.  I Georgina Peer bring him back in 6 weeks for wound check.  If we are still having issues with the right leg, he would need angiography.  This will need to be through a left femoral approach with plans for intervention on the tibial vessels.  Leia Alf, MD, FACS [facility] [phone] Pager [phone]

## 2021-02-21 ENCOUNTER — Ambulatory Visit (INDEPENDENT_AMBULATORY_CARE_PROVIDER_SITE_OTHER): Payer: Medicare Other | Admitting: Family

## 2021-02-21 ENCOUNTER — Encounter: Payer: Self-pay | Admitting: Family

## 2021-02-21 ENCOUNTER — Other Ambulatory Visit: Payer: Self-pay

## 2021-02-21 VITALS — BP 135/61 | HR 70 | Temp 96.6°F | Ht 74.0 in | Wt 298.8 lb

## 2021-02-21 DIAGNOSIS — E785 Hyperlipidemia, unspecified: Secondary | ICD-10-CM

## 2021-02-21 DIAGNOSIS — I509 Heart failure, unspecified: Secondary | ICD-10-CM

## 2021-02-21 DIAGNOSIS — I2510 Atherosclerotic heart disease of native coronary artery without angina pectoris: Secondary | ICD-10-CM | POA: Diagnosis not present

## 2021-02-21 DIAGNOSIS — Z794 Long term (current) use of insulin: Secondary | ICD-10-CM | POA: Diagnosis not present

## 2021-02-21 DIAGNOSIS — Z9861 Coronary angioplasty status: Secondary | ICD-10-CM | POA: Diagnosis not present

## 2021-02-21 DIAGNOSIS — Z95828 Presence of other vascular implants and grafts: Secondary | ICD-10-CM

## 2021-02-21 DIAGNOSIS — E1142 Type 2 diabetes mellitus with diabetic polyneuropathy: Secondary | ICD-10-CM | POA: Diagnosis not present

## 2021-02-21 DIAGNOSIS — I152 Hypertension secondary to endocrine disorders: Secondary | ICD-10-CM | POA: Diagnosis not present

## 2021-02-21 DIAGNOSIS — E1169 Type 2 diabetes mellitus with other specified complication: Secondary | ICD-10-CM

## 2021-02-21 DIAGNOSIS — E1159 Type 2 diabetes mellitus with other circulatory complications: Secondary | ICD-10-CM

## 2021-02-21 DIAGNOSIS — K219 Gastro-esophageal reflux disease without esophagitis: Secondary | ICD-10-CM | POA: Diagnosis not present

## 2021-02-21 DIAGNOSIS — F331 Major depressive disorder, recurrent, moderate: Secondary | ICD-10-CM

## 2021-02-21 DIAGNOSIS — E0841 Diabetes mellitus due to underlying condition with diabetic mononeuropathy: Secondary | ICD-10-CM | POA: Diagnosis not present

## 2021-02-21 DIAGNOSIS — F419 Anxiety disorder, unspecified: Secondary | ICD-10-CM

## 2021-02-21 LAB — BAYER DCA HB A1C WAIVED: HB A1C (BAYER DCA - WAIVED): 9.3 % — ABNORMAL HIGH (ref 4.8–5.6)

## 2021-02-21 NOTE — Progress Notes (Signed)
Subjective:    Patient ID: [name] [name], male    DOB: 1950-12-31, 70 y.o.   MRN: [id number]  Chief Complaint  Patient presents with   Medical Management of Chronic Issues   PT presents to the office today for chronic follow up. PT had NSTEMI on 07/30/16  and 05/23/20 and stent placed. Pt is followed by Cardiologists every 4 months. Pt currently in a clinical trial for hyperlipidemia. He is followed by Ortho for osteoarthritis and is currently getting injections in bilateral knees and back. Pt is followed by Pain Management every 2-3 months. Followed by Nephrologists every 3 months for CKD.    He is followed by Vascular and had stent placed in his right leg and had a left femoral-popliteal  bypass on 11/22/20. He had a abdominal aortogram with lower extremity on 01/28/21.  He is seeing ophthalmologist and retina specialists  because he is seeing a "long black spot" in his left eye.  Diabetes He presents for his follow-up diabetic visit. He has type 2 diabetes mellitus. Hypoglycemia symptoms include nervousness/anxiousness. Associated symptoms include blurred vision, fatigue and foot paresthesias. Symptoms are stable. Diabetic complications include heart disease, nephropathy and peripheral neuropathy. Risk factors for coronary artery disease include dyslipidemia, diabetes mellitus, male sex, hypertension and sedentary lifestyle. He is following a generally unhealthy diet. His overall blood glucose range is 180-200 mg/dl. (Does not check at home) An ACE inhibitor/angiotensin II receptor blocker is being taken. Eye exam is current.  Gastroesophageal Reflux He complains of belching and heartburn. This is a chronic problem. The current episode started more than 1 year ago. The problem occurs occasionally. Associated symptoms include fatigue. Risk factors include obesity. He has tried a PPI for the symptoms. The treatment provided moderate relief.  Hypertension This is a chronic problem. The current  episode started more than 1 year ago. The problem has been resolved since onset. The problem is controlled. Associated symptoms include anxiety, blurred vision, malaise/fatigue and peripheral edema. Pertinent negatives include no shortness of breath. Risk factors for coronary artery disease include dyslipidemia, diabetes mellitus, obesity and male gender. The current treatment provides moderate improvement.  Congestive Heart Failure Presents for follow-up visit. Associated symptoms include edema and fatigue. Pertinent negatives include no shortness of breath. The symptoms have been stable.  Hyperlipidemia This is a chronic problem. The current episode started more than 1 year ago. The problem is uncontrolled. Exacerbating diseases include obesity. Pertinent negatives include no shortness of breath. The current treatment provides mild improvement of lipids. Risk factors for coronary artery disease include dyslipidemia, diabetes mellitus, male sex, hypertension and a sedentary lifestyle.  Depression        This is a chronic problem.  The current episode started more than 1 year ago.   The onset quality is gradual.   The problem occurs intermittently.  Associated symptoms include fatigue, irritable and restlessness.  Associated symptoms include no helplessness and no hopelessness.  Past medical history includes anxiety.     Pertinent negatives include no schizophrenia. Anxiety Presents for follow-up visit. Symptoms include depressed mood, excessive worry, irritability, nervous/anxious behavior and restlessness. Patient reports no shortness of breath.       Review of Systems  Constitutional:  Positive for fatigue, irritability and malaise/fatigue.  Eyes:  Positive for blurred vision.  Respiratory:  Negative for shortness of breath.   Gastrointestinal:  Positive for heartburn.  Psychiatric/Behavioral:  Positive for depression. The patient is nervous/anxious.   All other systems reviewed and are  negative.  Objective:   Physical Exam Vitals reviewed.  Constitutional:      General: He is irritable. He is not in acute distress.    Appearance: He is well-developed. He is obese.  HENT:     Head: Normocephalic.     Right Ear: Tympanic membrane normal.     Left Ear: Tympanic membrane normal.  Eyes:     General:        Right eye: No discharge.        Left eye: No discharge.     Pupils: Pupils are equal, round, and reactive to light.  Neck:     Thyroid: No thyromegaly.  Cardiovascular:     Rate and Rhythm: Normal rate and regular rhythm.     Heart sounds: Normal heart sounds. No murmur heard. Pulmonary:     Effort: Pulmonary effort is normal. No respiratory distress.     Breath sounds: Normal breath sounds. No wheezing.  Abdominal:     General: Bowel sounds are normal. There is no distension.     Palpations: Abdomen is soft.     Tenderness: There is no abdominal tenderness.  Musculoskeletal:        General: No tenderness. Normal range of motion.     Cervical back: Normal range of motion and neck supple.     Right lower leg: Edema (trace) present.     Left lower leg: Edema (trace) present.  Skin:    General: Skin is warm and dry.     Findings: No erythema or rash.  Neurological:     Mental Status: He is alert and oriented to person, place, and time.     Cranial Nerves: No cranial nerve deficit.     Deep Tendon Reflexes: Reflexes are normal and symmetric.  Psychiatric:        Behavior: Behavior normal.        Thought Content: Thought content normal.        Judgment: Judgment normal.      BP 135/61   Pulse 70   Temp (!) 96.6 F (35.9 C) (Temporal)   Ht $R'6\' 2"'ih$  (1.88 m)   Wt 298 lb 12.8 oz (135.5 kg)   BMI 38.36 kg/m      Assessment & Plan:  [name] [name] comes in today with chief complaint of Medical Management of Chronic Issues   Diagnosis and orders addressed:  1. Congestive heart failure, unspecified HF chronicity, unspecified heart failure type  ([place]) - CMP14+EGFR - CBC with Differential/Platelet  2. Hypertension associated with diabetes ([place]) - CMP14+EGFR - CBC with Differential/Platelet  3. CAD S/P percutaneous coronary angioplasty - CMP14+EGFR - CBC with Differential/Platelet  4. Gastroesophageal reflux disease without esophagitis - CMP14+EGFR - CBC with Differential/Platelet  5. Type 2 diabetes mellitus with diabetic polyneuropathy, with long-term current use of insulin (HCC) - Bayer DCA Hb A1c Waived - CMP14+EGFR - CBC with Differential/Platelet - Microalbumin / creatinine urine ratio  6. Hyperlipidemia associated with type 2 diabetes mellitus (HCC) - CMP14+EGFR - CBC with Differential/Platelet  7. Diabetic mononeuropathy associated with diabetes mellitus due to underlying condition ([place]) - CMP14+EGFR - CBC with Differential/Platelet  8. Morbid obesity ([place]) - CMP14+EGFR - CBC with Differential/Platelet  9. Anxiety - CMP14+EGFR - CBC with Differential/Platelet  10. S/P femoral-popliteal bypass surgery - CMP14+EGFR - CBC with Differential/Platelet  11. Moderate episode of recurrent major depressive disorder ([place]) - CMP14+EGFR - CBC with Differential/Platelet   Labs pending Health Maintenance reviewed Diet and exercise encouraged  Follow up plan: 3  months    [name], FNP

## 2021-02-21 NOTE — Patient Instructions (Signed)
Diabetes Mellitus and Nutrition, Adult When you have diabetes, or diabetes mellitus, it is very important to have healthy eating habits because your blood sugar (glucose) levels are greatly affected by what you eat and drink. Eating healthy foods in the right amounts, at about the same times every day, can help you:  Control your blood glucose.  Lower your risk of heart disease.  Improve your blood pressure.  Reach or maintain a healthy weight. What can affect my meal plan? Every person with diabetes is different, and each person has different needs for a meal plan. Your health care provider may recommend that you work with a dietitian to make a meal plan that is best for you. Your meal plan may vary depending on factors such as:  The calories you need.  The medicines you take.  Your weight.  Your blood glucose, blood pressure, and cholesterol levels.  Your activity level.  Other health conditions you have, such as heart or kidney disease. How do carbohydrates affect me? Carbohydrates, also called carbs, affect your blood glucose level more than any other type of food. Eating carbs naturally raises the amount of glucose in your blood. Carb counting is a method for keeping track of how many carbs you eat. Counting carbs is important to keep your blood glucose at a healthy level, especially if you use insulin or take certain oral diabetes medicines. It is important to know how many carbs you can safely have in each meal. This is different for every person. Your dietitian can help you calculate how many carbs you should have at each meal and for each snack. How does alcohol affect me? Alcohol can cause a sudden decrease in blood glucose (hypoglycemia), especially if you use insulin or take certain oral diabetes medicines. Hypoglycemia can be a life-threatening condition. Symptoms of hypoglycemia, such as sleepiness, dizziness, and confusion, are similar to symptoms of having too much  alcohol.  Do not drink alcohol if: ? Your health care provider tells you not to drink. ? You are pregnant, may be pregnant, or are planning to become pregnant.  If you drink alcohol: ? Do not drink on an empty stomach. ? Limit how much you use to:  0-1 drink a day for women.  0-2 drinks a day for men. ? Be aware of how much alcohol is in your drink. In the [place] one drink equals one 12 oz bottle of beer (355 mL), one 5 oz glass of wine (148 mL), or one 1 oz glass of hard liquor (44 mL). ? Keep yourself hydrated with water, diet soda, or unsweetened iced tea.  Keep in mind that regular soda, juice, and other mixers may contain a lot of sugar and must be counted as carbs. What are tips for following this plan? Reading food labels  Start by checking the serving size on the "Nutrition Facts" label of packaged foods and drinks. The amount of calories, carbs, fats, and other nutrients listed on the label is based on one serving of the item. Many items contain more than one serving per package.  Check the total grams (g) of carbs in one serving. You can calculate the number of servings of carbs in one serving by dividing the total carbs by 15. For example, if a food has 30 g of total carbs per serving, it would be equal to 2 servings of carbs.  Check the number of grams (g) of saturated fats and trans fats in one serving. Choose foods that have   a low amount or none of these fats.  Check the number of milligrams (mg) of salt (sodium) in one serving. Most people should limit total sodium intake to less than 2,300 mg per day.  Always check the nutrition information of foods labeled as "low-fat" or "nonfat." These foods may be higher in added sugar or refined carbs and should be avoided.  Talk to your dietitian to identify your daily goals for nutrients listed on the label. Shopping  Avoid buying canned, pre-made, or processed foods. These foods tend to be high in fat, sodium, and added  sugar.  Shop around the outside edge of the grocery store. This is where you will most often find fresh fruits and vegetables, bulk grains, fresh meats, and fresh dairy. Cooking  Use low-heat cooking methods, such as baking, instead of high-heat cooking methods like deep frying.  Cook using healthy oils, such as olive, canola, or sunflower oil.  Avoid cooking with butter, cream, or high-fat meats. Meal planning  Eat meals and snacks regularly, preferably at the same times every day. Avoid going long periods of time without eating.  Eat foods that are high in fiber, such as fresh fruits, vegetables, beans, and whole grains. Talk with your dietitian about how many servings of carbs you can eat at each meal.  Eat 4-6 oz (112-168 g) of lean protein each day, such as lean meat, chicken, fish, eggs, or tofu. One ounce (oz) of lean protein is equal to: ? 1 oz (28 g) of meat, chicken, or fish. ? 1 egg. ?  cup (62 g) of tofu.  Eat some foods each day that contain healthy fats, such as avocado, nuts, seeds, and fish.   What foods should I eat? Fruits Berries. Apples. Oranges. Peaches. Apricots. Plums. Grapes. Mango. Papaya. Pomegranate. Kiwi. Cherries. Vegetables Lettuce. Spinach. Leafy greens, including kale, chard, collard greens, and mustard greens. Beets. Cauliflower. Cabbage. Broccoli. Carrots. Green beans. Tomatoes. Peppers. Onions. Cucumbers. Brussels sprouts. Grains Whole grains, such as whole-wheat or whole-grain bread, crackers, tortillas, cereal, and pasta. Unsweetened oatmeal. Quinoa. Brown or wild rice. Meats and other proteins Seafood. Poultry without skin. Lean cuts of poultry and beef. Tofu. Nuts. Seeds. Dairy Low-fat or fat-free dairy products such as milk, yogurt, and cheese. The items listed above may not be a complete list of foods and beverages you can eat. Contact a dietitian for more information. What foods should I avoid? Fruits Fruits canned with  syrup. Vegetables Canned vegetables. Frozen vegetables with butter or cream sauce. Grains Refined white flour and flour products such as bread, pasta, snack foods, and cereals. Avoid all processed foods. Meats and other proteins Fatty cuts of meat. Poultry with skin. Breaded or fried meats. Processed meat. Avoid saturated fats. Dairy Full-fat yogurt, cheese, or milk. Beverages Sweetened drinks, such as soda or iced tea. The items listed above may not be a complete list of foods and beverages you should avoid. Contact a dietitian for more information. Questions to ask a health care provider  Do I need to meet with a diabetes educator?  Do I need to meet with a dietitian?  What number can I call if I have questions?  When are the best times to check my blood glucose? Where to find more information:  American Diabetes Association: diabetes.org  Academy of Nutrition and Dietetics: www.eatright.org  National Institute of Diabetes and Digestive and Kidney Diseases: www.niddk.nih.gov  Association of Diabetes Care and Education Specialists: www.diabeteseducator.org Summary  It is important to have healthy eating   habits because your blood sugar (glucose) levels are greatly affected by what you eat and drink.  A healthy meal plan will help you control your blood glucose and maintain a healthy lifestyle.  Your health care provider may recommend that you work with a dietitian to make a meal plan that is best for you.  Keep in mind that carbohydrates (carbs) and alcohol have immediate effects on your blood glucose levels. It is important to count carbs and to use alcohol carefully. This information is not intended to replace advice given to you by your health care provider. Make sure you discuss any questions you have with your health care provider. Document Revised: 04/11/2019 Document Reviewed: 04/11/2019 Elsevier Patient Education  2021 Elsevier Inc.  

## 2021-02-22 LAB — CBC WITH DIFFERENTIAL/PLATELET
Basophils Absolute: 0.1 10*3/uL (ref 0.0–0.2)
Basos: 1 %
EOS (ABSOLUTE): 0.2 10*3/uL (ref 0.0–0.4)
Eos: 2 %
Hematocrit: 38.7 % (ref 37.5–51.0)
Hemoglobin: 11.5 g/dL — ABNORMAL LOW (ref 13.0–17.7)
Immature Grans (Abs): 0 10*3/uL (ref 0.0–0.1)
Immature Granulocytes: 0 %
Lymphocytes Absolute: 1.9 10*3/uL (ref 0.7–3.1)
Lymphs: 21 %
MCH: 22.7 pg — ABNORMAL LOW (ref 26.6–33.0)
MCHC: 29.7 g/dL — ABNORMAL LOW (ref 31.5–35.7)
MCV: 76 fL — ABNORMAL LOW (ref 79–97)
Monocytes Absolute: 0.8 10*3/uL (ref 0.1–0.9)
Monocytes: 8 %
Neutrophils Absolute: 6.3 10*3/uL (ref 1.4–7.0)
Neutrophils: 68 %
Platelets: 349 10*3/uL (ref 150–450)
RBC: 5.07 x10E6/uL (ref 4.14–5.80)
RDW: 14.4 % (ref 11.6–15.4)
WBC: 9.2 10*3/uL (ref 3.4–10.8)

## 2021-02-22 LAB — CMP14+EGFR
ALT: 14 IU/L (ref 0–44)
AST: 18 IU/L (ref 0–40)
Albumin/Globulin Ratio: 1.7 (ref 1.2–2.2)
Albumin: 4.6 g/dL (ref 3.8–4.8)
Alkaline Phosphatase: 53 IU/L (ref 44–121)
BUN/Creatinine Ratio: 13 (ref 10–24)
BUN: 19 mg/dL (ref 8–27)
Bilirubin Total: 0.3 mg/dL (ref 0.0–1.2)
CO2: 25 mmol/L (ref 20–29)
Calcium: 9.8 mg/dL (ref 8.6–10.2)
Chloride: 98 mmol/L (ref 96–106)
Creatinine, Ser: 1.5 mg/dL — ABNORMAL HIGH (ref 0.76–1.27)
Globulin, Total: 2.7 g/dL (ref 1.5–4.5)
Glucose: 160 mg/dL — ABNORMAL HIGH (ref 70–99)
Potassium: 4.9 mmol/L (ref 3.5–5.2)
Sodium: 138 mmol/L (ref 134–144)
Total Protein: 7.3 g/dL (ref 6.0–8.5)
eGFR: 50 mL/min/{1.73_m2} — ABNORMAL LOW (ref >59)

## 2021-02-23 LAB — MICROALBUMIN / CREATININE URINE RATIO
Creatinine, Urine: 78.3 mg/dL
Microalb/Creat Ratio: 95 mg/g creat — ABNORMAL HIGH (ref 0–29)
Microalbumin, Urine: 74.1 ug/mL

## 2021-02-24 ENCOUNTER — Encounter: Payer: Self-pay | Admitting: Neurology

## 2021-02-24 ENCOUNTER — Ambulatory Visit: Payer: Medicare Other | Admitting: Neurology

## 2021-02-24 ENCOUNTER — Other Ambulatory Visit: Payer: Self-pay | Admitting: Family

## 2021-02-24 VITALS — BP 142/77 | HR 84 | Ht 74.0 in | Wt 298.2 lb

## 2021-02-24 DIAGNOSIS — E0842 Diabetes mellitus due to underlying condition with diabetic polyneuropathy: Secondary | ICD-10-CM | POA: Diagnosis not present

## 2021-02-24 DIAGNOSIS — I6381 Other cerebral infarction due to occlusion or stenosis of small artery: Secondary | ICD-10-CM | POA: Diagnosis not present

## 2021-02-24 DIAGNOSIS — G459 Transient cerebral ischemic attack, unspecified: Secondary | ICD-10-CM | POA: Diagnosis not present

## 2021-02-24 DIAGNOSIS — E785 Hyperlipidemia, unspecified: Secondary | ICD-10-CM | POA: Diagnosis not present

## 2021-02-24 DIAGNOSIS — R413 Other amnesia: Secondary | ICD-10-CM

## 2021-02-24 DIAGNOSIS — I679 Cerebrovascular disease, unspecified: Secondary | ICD-10-CM

## 2021-02-24 DIAGNOSIS — I480 Paroxysmal atrial fibrillation: Secondary | ICD-10-CM

## 2021-02-24 DIAGNOSIS — I6529 Occlusion and stenosis of unspecified carotid artery: Secondary | ICD-10-CM | POA: Diagnosis not present

## 2021-02-24 DIAGNOSIS — E1169 Type 2 diabetes mellitus with other specified complication: Secondary | ICD-10-CM

## 2021-02-24 MED ORDER — MOUNJARO 7.5 MG/0.5ML ~~LOC~~ SOAJ: 7.5000 mg | SUBCUTANEOUS | 0 refills | Status: DC

## 2021-02-24 MED ORDER — TIRZEPATIDE 2.5 MG/0.5ML ~~LOC~~ SOAJ: 2.5000 mg | SUBCUTANEOUS | 0 refills | Status: DC

## 2021-02-24 MED ORDER — MOUNJARO 5 MG/0.5ML ~~LOC~~ SOAJ: 5.0000 mg | SUBCUTANEOUS | 0 refills | Status: DC

## 2021-02-24 NOTE — Patient Instructions (Signed)
Formal memory testing I'll contact dr. [name] and patient may consider a CTA of the head (CT Arteries) if necessary Transient Ischemic Attack A transient ischemic attack (TIA) causes stroke-like symptoms that go away quickly. Having a TIA means that a person is at higher risk for a stroke. A TIA happens when blood supply to the brain is blocked temporarily. A TIA is a medical emergency. What are the causes? This condition is caused by a temporary blockage in an artery in the head or neck. This means the brain does not get the blood supply it needs. There is no permanent brain damage with a TIA. A blockage can be caused by: Fatty buildup in an artery in the head or neck (atherosclerosis). A blood clot. An artery tear (dissection). Inflammation of an artery (vasculitis). Sometimes the cause is not known. What increases the risk? Certain factors may make you more likely to develop this condition. Some of these are things that you can change, such as: Obesity. Using products that contain nicotine or tobacco. Taking oral birth control, especially if you also use tobacco. Not being active. Heavy alcohol use. Drug use, especially cocaine and methamphetamine. Medical conditions that may increase your risk include: High blood pressure (hypertension). High cholesterol. Diabetes. Heart disease (coronary artery disease). An irregular heartbeat, also called atrial fibrillation (AFib). Sickle cell disease. Sleep problems (sleep apnea). Chronic inflammatory diseases, such as rheumatoid arthritis or lupus. Blood clotting disorders (hypercoagulable state). Other risk factors include: Being over the age of 78. Being male. Family history of stroke. Previous history of blood clots, stroke, TIA, or heart attack. Having a history of preeclampsia. Migraine headache. What are the signs or symptoms? Symptoms of a TIA are the same as those of a stroke. The symptoms develop suddenly, and then go away  quickly. They may include: Weakness or numbness in your face, arm, or leg, especially on one side of your body. Trouble walking or moving your arms or legs. Trouble speaking, understanding speech, or both (aphasia). Vision changes, such as double vision, blurred vision, or loss of vision. Dizziness. Confusion. Loss of balance or coordination. Nausea and vomiting. Severe headache. If possible, note what time your symptoms started. Tell your health care provider. How is this diagnosed? This condition may be diagnosed based on: Your symptoms and medical history. A physical exam. Imaging tests, usually a CT scan or MRI of the brain. Blood tests. You may also have other tests, including: Electrocardiogram (ECG). Echocardiogram. Carotid ultrasound. A scan of blood circulation in the brain (CT angiogram or MR angiogram). Continuous heart monitoring. How is this treated? The goal of treatment is to reduce the risk for a stroke. Stroke prevention therapies may include: Changes to diet and lifestyle, such as being physically active and stopping smoking. Medicines to thin the blood (antiplatelets or anticoagulants). Blood pressure medicines. Medicines to reduce cholesterol. Treating other health conditions, such as diabetes or AFib. If testing shows a narrowing in the arteries to your brain, your health care provider may recommend a procedure, such as: Carotid endarterectomy. This is done to remove the blockage from your artery. Carotid angioplasty and stenting. This uses a tube (stent) to open or widen an artery in the neck. The stent helps keep the artery open by supporting the artery walls. Follow these instructions at home: Medicines Take over-the-counter and prescription medicines only as told by your health care provider. If you were told to take a medicine to thin your blood, such as aspirin or an anticoagulant, take it  exactly as told by your health care provider. Taking too much  blood-thinning medicine can cause bleeding. Taking too little will not protect you against a stroke and other problems. Eating and drinking  Eat 5 or more servings of fruits and vegetables each day. Follow guidelines from your health care provider about your diet. You may need to follow a certain diet to help manage risk factors for stroke. This may include: Eating a low-fat, low-salt diet. Choosing high-fiber foods. Limiting carbohydrates and sugar. If you drink alcohol: Limit how much you have to: 0-1 drink a day for women who are not pregnant. 0-2 drinks a day for men. Know how much alcohol is in a drink. In the [place] one drink equals one 12 oz bottle of beer (368mL), one 5 oz glass of wine (165mL), or one 1 oz glass of hard liquor (11mL). General instructions Maintain a healthy weight. Try to get at least 30 minutes of exercise on most days. Get treatment if you have sleep apnea. Do not use any products that contain nicotine or tobacco. These products include cigarettes, chewing tobacco, and vaping devices, such as e-cigarettes. If you need help quitting, ask your health care provider. Do not use drugs. Keep all follow-up visits. This is important. Where to find more information American Stroke Association: www.stroke.org Get help right away if: You have chest pain or an irregular heartbeat. You have any symptoms of a stroke. "BE FAST" is an easy way to remember the main warning signs of a stroke. B - Balance. Signs are dizziness, sudden trouble walking, or loss of balance. E - Eyes. Signs are trouble seeing or a sudden change in vision. F - Face. Signs are sudden weakness or numbness of the face, or the face or eyelid drooping on one side. A - Arms. Signs are weakness or numbness in an arm. This happens suddenly and usually on one side of the body. S - Speech. Signs are sudden trouble speaking, slurred speech, or trouble understanding what people say. T - Time. Time to call  emergency services. Write down what time symptoms started. You have other signs of a stroke, such as: A sudden, severe headache with no known cause. Nausea or vomiting. Seizure. These symptoms may represent a serious problem that is an emergency. Do not wait to see if the symptoms will go away. Get medical help right away. Call your local emergency services (911 in the [place] Do not drive yourself to the hospital. Summary A transient ischemic attack (TIA) happens when an artery in the head or neck is blocked. The blockage clears before there is any permanent brain damage. A TIA is a medical emergency. Symptoms of a TIA are the same as those of a stroke. The symptoms develop suddenly, and then go away quickly. Having a TIA means that you are at higher risk for a stroke. The goal of treatment is to reduce your risk for a stroke. Treatment may include medicines to thin the blood and changes to diet and lifestyle. This information is not intended to replace advice given to you by your health care provider. Make sure you discuss any questions you have with your health care provider. Document Revised: 11/28/2019 Document Reviewed: 11/28/2019 [employer] Patient Education  [place].

## 2021-02-24 NOTE — Progress Notes (Signed)
[name] [name]    Provider:  Dr Jaynee Eagles Requesting Provider: [name], FNP Primary Care Physician:  [name], FNP    CC: TIA and memory loss  02/24/2021: Patient here for follow up of TIA in July. He has a past medical history of untreated sleep apnea, morbid obesity, B12 deficiency on supplementation, syncope, depression, intolerance to statin, leg ulcers status post popliteal bypass graft and femoral artery stenting.  Also cerebrovascular disease, headaches, chronic kidney disease, peripheral vascular disease, myocardial infarction status post stent July 30, 2016 follows with cardiology and is in a clinical trial for hyperlipidemia, followed by pain management every 2 to 3 months, chronic kidney disease followed by nephrology, followed by vascular and had stent placement in his right leg,, medical noncompliance, hypertension, hyperlipidemia, diabetes, depression, coronary artery disease, arthritis, anxiety, cerebrovascular disease, congestive heart failure, A. fib, diabetic peripheral neuropathy, lacunar infarct, degenerative disc disease, drug-induced myopathy, obesity, former smoker with greater than 30-pack-year history.  He declines to wear CPAP.  We have evaluated him in the past for multiple other disorders including cerebrovascular disease, lacunar strokes, noncompliance with CPAP and untreated obstructive sleep apnea with morning headaches.  Patient was seen at the emergency room November 23, 2020 for left upper extremity numbness and ataxia, he woke up with left thumb index and middle finger numbness, ataxic on PT evaluation, patient's Eliquis was held for a week prior to recent surgery of superficial femoral artery stenting, in that setting he awoke with numbness in his arm, multiple stroke risk factors, MRI of the brain was negative for acute abnormalities, but MRI of the head and neck showed severe distal left ICA near occlusive stenosis with a decreased flow in  the intracranial left ICA and MCA, chronic left VA occlusive severe stenosis bilateral cavernous ICAs, right ICA about 40 to 50% stenosis.  Symptoms resolved that the MRI of the head and neck concerning for new left ICA high-grade stenosis with downstream MCA decreased flow.  CTA was recommended but he declined.  He is back on Eliquis and Plavix.      HPI January 01, 2020: Patient here as a new request from [facility] for near syncope.  He has a past medical history of untreated sleep apnea, headaches, chronic kidney disease, peripheral vascular disease, myocardial infarction status post stent July 30, 2016 follows with cardiology and is in a clinical trial for hyperlipidemia, followed by pain management every 2 to 3 months, chronic kidney disease followed by nephrology, followed by vascular and had stent placement in his right leg,, medical noncompliance, hypertension, hyperlipidemia, diabetes, depression, coronary artery disease, arthritis, anxiety, cerebrovascular disease, congestive heart failure, A. fib, diabetic peripheral neuropathy, lacunar infarct, degenerative disc disease, drug-induced myopathy, obesity, former smoker with greater than 30-pack-year history.  He declines to wear CPAP.  We have evaluated him in the past for multiple other disorders including cerebrovascular disease, lacunar strokes, noncompliance with CPAP and untreated obstructive sleep apnea with morning headaches.  I reviewed Dr. Lenna [place]ford notes: Patient had near syncope had a steroid injection about 2 weeks prior to appointment and had one episode of blacking out, blurred vision and headache.  He follows with cardiology and was referred back to [place] for near syncope.  He has a statin allergy.  The episode occurred June 12, his vision became dark and he felt weak, in the office his blood pressure was decreased, reviewed recent labs October 30, 2019 included a normal CBC, CMP with creatinine 1.56 and BUN 29, hemoglobin A1c  6.9.   He is  here with his wife today who also provides much information, he is having episodes where he passes out, last one was on June 26 he was in the truck, it was hot, he jumped out of the truck and had to sister and when he straightened up that is when it started, he could hear family asking questions but could not talk, he could not see at all after they got home, he did not go to the hospital. He has had 2 of them, last one was the worst, he has had 2 of them, the first one he was on a golf cart, he got up to come in the house and he made it to the living room, started getting lightheaded, felt like he was going to pass out, laying down can revent the passing out, for a few seconds, vision tunnels in, started wearing and looks pale, feels clammy. Blood pressures have been low. He doesn't drink a lot he has cut back on sodas. He went to cardiology and blood pressure is improved and hasn;t had an episode since June. He doesn't urinate on himself or lose bowel control or have abnormal movements with the syncopal episodes. No post-ictal confusion. He has untreated seep apnea. No episodes since adjusting his blood pressure medications. No focal weakness, speech changes, stroke-like episodes. No other focal neurologic deficits, associated symptoms, inciting events or modifiable factors.During episodes BP has been as low as 80/56. He feel foggy, memory loss. FHx of dementia. Driving, not getting lost, no accidents, still performing all your ADLS IADLs, no personality changes or hallucinations or delusions.   CC:  Headaches, untreated sleep apnea  Interval history 03/15/2019: He wakes up with headaches and goes to sleep with headaches. He has pain pills . It can be anywhere, on either side, both sides, pounding, it gets better during the day. He wakes up with headaches. No hx of migraines. No significant l ight sensitivity, no sound sensitivity, no nausea or vomiting. They can't travel, the virus has been difficult, he  wanted to travel, he is stuck at home. The headache is throbbing. No tearing of the eyes or other autonomic symptoms. Can hurt right behind the eye. It is most days. It has gotten better lately but can be bad. Also the pollen was bad. He woke up one morning at 4am with a headache, his ear was covered with blood. Headaches associated with pressure in the sinuses and ear.   Reviewed CT Head: 02/20/2019: Stable left basal ganglia lacunar infarct. Mild atrophic changes are noted commenced with the patient's given age. No findings to suggest acute hemorrhage, acute infarction or space-occupying mass lesion are noted.   Vascular: No hyperdense vessel or unexpected calcification.   Skull: Normal. Negative for fracture or focal lesion.   Sinuses/Orbits: No acute finding.   Other: None.   IMPRESSION: Chronic changes as described. No acute intracranial abnormality is noted.    Interval history 03/14/2019: 70 y.o. male here as requested by [name], FNP for intractable headache. PMHx medical noncompliance, hypertension, hyperlipidemia, myocardial infarction, disorders of iron metabolism, diabetes, depression, cellulitis, arthritis, anxiety, coronary artery disease, occlusion and stenosis of vertebral artery, congestive heart failure, A. fib, periodontal disease, diabetic neuropathy. He is seeing my colleague Dr. Brett Fairy for sleep apnea. However despite aoneas and snoring, he states he would never wear a cpap. He has been a many-decade long smoker.   Interval History 03/29/2018: GARNELL PHENIX is a 70 y.o. male here  as a referral from Dr. Lenna Gilford for atherosclerotic disease. PMHx HTN, diabetes, diabetic neuropathy, HLD, depression, obesity, afib. Not smoking. He is morbidly obese. He still snores. Not wearing cpap. He is having a sleep consultation Thursday. Discussed weight loss. Discussed trying to manage his diabetes closely, hga1c 8 (goal is <7),  Discussed weight and weight loss and he declines a  referral to the [facility] [place]. Triglycerides too high to calculate LDL, he sees cardiology and is in a study for his cholesterol discussed ldl goal < 70, untreated sleep apnea. No syncopal episodes since stopping metoprolol. No new events.    reviewed images of CT 01/2018 and agree with the following:  IMPRESSION: 1. No acute intracranial process. 2. Old LEFT basal ganglia lacunar infarct, otherwise negative non-contrast CT HEAD for age.  Interval history 01/13/2017: Patient is here with his wife, he is lovely. He has good days and bad days, he has 1-2 cigarrettes every 2 weeks, he does not drink alcohol. He is on Eliquis and Plavix. Loop recorder identified afib. He has palpitations and yesterday he gets short of breath.His metoprolol was increased. He has shallow breathing and stops breathing at night.    HPI 05/24/2016:  ARTHA CHIASSON is a 70 y.o. male here as a referral from Dr. Lenna Gilford for atherosclerotic disease. PMHx HTN, diabetes, diabetic neuropathy, HLD, depression, obesity. Patient is here after admission to the [facility] in early January of this year for visual field changes, blurry vision, Sharp pain behind his right eye and right-sided headache.He was admitted to telemetry for further evaluation. Carotid ultrasound did not show any significant ICA stenosis. MRI of the brain without contrast showed chronic lacunar infarcts but nothing new. Imaging of the vessels showed atheromatous disease. EEG was normal. He did have a complete stroke workup and he is here for follow-up today. He was changed from aspirin to Plavix inpatient.   He stopped taking the Plavix. He has gone back to taking a baby aspirin. He is trying to manage his diabetes. He is on Trulicity now and working on Mudlogger with pcp. He is taking his glucose multiple times a day and adjusting insulin sliding scale. He tried a statin again and he can't tolerate it and stopped it. He is trying to eat better.  He sees Administrator, arts.  He had some right sided sharp neck pain that radiated down the right arm. Since he left the hospital he can hear his heart beat in his neck radiating to the right ear where the sharp pain was (points to the carotid on the right)   Reviewed notes, labs and imaging from outside physicians, which showed:   HgbA1c 10 05/2016 Triglycerides 647 Cholesterol 239 Cannot calculat LDL   MRI brain:   IMPRESSION: 1. No acute intracranial abnormality. 2. Chronic left basal ganglia lacunar infarct.   MRA head: 1. No visible flow in the left V4 segment. See MRA neck that is reported separately. 2. High-grade atheromatous narrowings at the left ICA anterior genu, bilateral M2 branches, and bilateral P2 branches.   MRA neck : 1. Thready flow within the left vertebral artery which is new compared to 2015 imaging. Suspect a high-grade proximal left subclavian stenosis favoring steal phenomenon. Dissection is the main differential consideration. CTA is recommended to further evaluate. 2. Cervical carotid atherosclerosis with ~ 40% proximal left ICA Stenosis.   CT Angio of the neck: 1. Left V3 segment occlusion. Distal left V4 reconstitution by the basilar with patent  left PICA. 2. Diffuse narrowing and luminal irregularity of the left vertebral artery. Correlate for neck pain as dissection or atherosclerosis and underfilling could give this appearance. 3. Mild atherosclerotic narrowing of the left subclavian ostium. No subclavian flow limiting stenosis. 4. Cervical carotid atherosclerosis with 30 to 40 % ICA narrowing on the left.    Review of Systems:  Patient complains of symptoms per HPI as well as the following symptoms: short-term memory loss . Pertinent negatives and positives per HPI. All others negative    Social History   Socioeconomic History   Marital status: Married    Spouse name: Belenda Cruise   Number of children: 2   Years of education:  12   Highest education level: 12th grade  Occupational History   Occupation: retired  Tobacco Use   Smoking status: Former    Packs/day: 0.25    Years: 51.00    Pack years: 12.75    Types: Cigarettes    Quit date: 08/14/2016    Years since quitting: 4.5   Smokeless tobacco: Never   Tobacco comments:    smokes  a pack a week  Vaping Use   Vaping Use: Never used  Substance and Sexual Activity   Alcohol use: No    Alcohol/week: 0.0 standard drinks   Drug use: No   Sexual activity: Yes  Other Topics Concern   Not on file  Social History Narrative   Lives with his wife.  Retired.  Unable to afford expensive medicines.     He has 2 children from previous marriage - they live in [place]       Caffeine: 1 cup of 1/2 caff coffee in AM, drinks more if at a restaurant    Social Determinants of Health   Financial Resource Strain: Medium Risk   Difficulty of Paying Living Expenses: Somewhat hard  Food Insecurity: No Food Insecurity   Worried About Charity fundraiser in the Last Year: Never true   Ran Out of Food in the Last Year: Never true  Transportation Needs: No Transportation Needs   Lack of Transportation (Medical): No   Lack of Transportation (Non-Medical): No  Physical Activity: Insufficiently Active   Days of Exercise per Week: 7 days   Minutes of Exercise per Session: 10 min  Stress: No Stress Concern Present   Feeling of Stress : Only a little  Social Connections: Moderately Isolated   Frequency of Communication with Friends and Family: More than three times a week   Frequency of Social Gatherings with Friends and Family: Twice a week   Attends Religious Services: Never   Marine scientist or Organizations: No   Attends Music therapist: Never   Marital Status: Married  Human resources officer Violence: Not At Risk   Fear of Current or Ex-Partner: No   Emotionally Abused: No   Physically Abused: No   Sexually Abused: No    Family History   Problem Relation Age of Onset   Diabetes Father    Valvular heart disease Father    Arthritis Father    Heart disease Father    Stroke Father    Alzheimer's disease Mother    Hyperlipidemia Mother    Hypertension Mother    Arthritis Mother    Lung cancer Mother    Stroke Mother    Headache Mother    Arthritis/Rheumatoid Sister    Diabetes Sister    Hypertension Sister    Hyperlipidemia Sister    Depression Sister  Dementia Maternal Aunt    Dementia Maternal Uncle    Heart disease Maternal Uncle    Stomach cancer Paternal Uncle    Colon cancer Neg Hx    Liver disease Neg Hx     Past Medical History:  Diagnosis Date   Anxiety    Arthritis    Atrial fibrillation ([place])    CAD (coronary artery disease)    a. 2010: DES to CTO of RCA. EF 55% b. 07/2016: cath showing total occlusion within previously placed RCA stent (collaterals present), severe stenosis along LCx and OM1 (treated with 2 overlapping DES). c. repeat cath in 01/2018 showing patent stents along LCx and OM with CTO of D2, CTO of distal LCx, and CTO of RCA with collaterals present overall unchanged since 2018 with medical management recom   Cellulitis and abscess rt groin    Complication of anesthesia    " I woke up during a colonoscopy "      Depression    Diabetes mellitus    Diastolic CHF ([place])    Disorders of iron metabolism    Dysrhythmia    GERD (gastroesophageal reflux disease)    Hyperlipidemia    Hypertension    Low serum testosterone level    Medically noncompliant    Myocardial infarction [facility])    05-23-20    Past Surgical History:  Procedure Laterality Date   ABDOMINAL AORTOGRAM W/LOWER EXTREMITY N/A 03/21/2019   Procedure: ABDOMINAL AORTOGRAM W/LOWER EXTREMITY;  Surgeon: [name], MD;  Location: [place] [facility];  Service: Cardiovascular;  Laterality: N/A;   ABDOMINAL AORTOGRAM W/LOWER EXTREMITY N/A 11/12/2020   Procedure: ABDOMINAL AORTOGRAM W/LOWER EXTREMITY;  Surgeon: [name], MD;  Location: [place] [facility];  Service: Cardiovascular;  Laterality: N/A;   ABDOMINAL AORTOGRAM W/LOWER EXTREMITY N/A 01/28/2021   Procedure: ABDOMINAL AORTOGRAM W/LOWER EXTREMITY;  Surgeon: [name], MD;  Location: [place] [facility];  Service: Cardiovascular;  Laterality: N/A;   BACK SURGERY  2015   ACDF by Dr. [name]   COLONOSCOPY N/A 10/01/2014   Dr. [name]: multiple tubular adenomas removed, colonic diverticulosis, redundant colon. next tcs advised for 09/2017. PATIENT NEEDS PROPOFOL FOR FAILED CONSCIOUS SEDATION   CORONARY STENT INTERVENTION N/A 07/30/2016   Procedure: Coronary Stent Intervention;  Surgeon: [name], MD;  Location: [place] [facility];  Service: Cardiovascular;  Laterality: N/A;   CORONARY STENT PLACEMENT  2000   By Dr. Olevia Perches   EP IMPLANTABLE DEVICE N/A 05/25/2016   Procedure: Loop Recorder Insertion;  Surgeon: [name], MD;  Location: [place] [facility];  Service: Cardiovascular;  Laterality: N/A;   ESOPHAGOGASTRODUODENOSCOPY     esophagus stretched remotely at [facility]   ESOPHAGOGASTRODUODENOSCOPY N/A 10/01/2014   Dr. [name]: patchy mottling/erythema and minimal polypoid appearance of gastric mucosa. bx with mild inlammation but no H.pylori   FEMORAL-POPLITEAL BYPASS GRAFT Left 11/22/2020   Procedure: LEFT FEMORAL-POPLITEAL BYPASS GRAFT;  Surgeon: [name], MD;  Location: [place];  Service: Vascular;  Laterality: Left;   HERNIA REPAIR  7412   umbilical   INSERTION OF ILIAC STENT Right 11/22/2020   Procedure: INSERTION OF ELUVIA STENT INTO RIGHT DISTAL SUPERFICIAL FEMORAL ARTERY;  Surgeon: [name], MD;  Location: [place];  Service: Vascular;  Laterality: Right;   LEFT HEART CATH AND CORONARY ANGIOGRAPHY N/A 07/30/2016   Procedure: Left Heart Cath and Coronary Angiography;  Surgeon: [name], MD;  Location: [place] [facility];  Service: Cardiovascular;  Laterality: N/A;  LEFT HEART CATH AND CORONARY ANGIOGRAPHY N/A 01/19/2018    Procedure: LEFT HEART CATH AND CORONARY ANGIOGRAPHY;  Surgeon: [name], MD;  Location: [place] [facility];  Service: Cardiovascular;  Laterality: N/A;   LEFT HEART CATH AND CORONARY ANGIOGRAPHY N/A 05/24/2020   Procedure: LEFT HEART CATH AND CORONARY ANGIOGRAPHY;  Surgeon: Burnell Blanks, MD;  Location: [place] [facility];  Service: Cardiovascular;  Laterality: N/A;   LESION REMOVAL     Lip and hand    LOWER EXTREMITY ANGIOGRAM Right 11/22/2020   Procedure: RIGHT LEG ANGIOGRAM;  Surgeon: [name], MD;  Location: [facility];  Service: Vascular;  Laterality: Right;   LOWER EXTREMITY ANGIOGRAPHY N/A 04/18/2019   Procedure: LOWER EXTREMITY ANGIOGRAPHY;  Surgeon: [name], MD;  Location: [place] [facility];  Service: Cardiovascular;  Laterality: N/A;   NECK SURGERY     PERIPHERAL VASCULAR BALLOON ANGIOPLASTY  04/18/2019   Procedure: PERIPHERAL VASCULAR BALLOON ANGIOPLASTY;  Surgeon: [name], MD;  Location: [place] [facility];  Service: Cardiovascular;;   PERIPHERAL VASCULAR BALLOON ANGIOPLASTY Left 11/12/2020   Procedure: PERIPHERAL VASCULAR BALLOON ANGIOPLASTY;  Surgeon: [name], MD;  Location: [place] [facility];  Service: Cardiovascular;  Laterality: Left;  Failed PTA of superficial femoral artery.   PERIPHERAL VASCULAR INTERVENTION Right 03/21/2019   Procedure: PERIPHERAL VASCULAR INTERVENTION;  Surgeon: [name], MD;  Location: [place] [facility];  Service: Cardiovascular;  Laterality: Right;  superficial femoral    Current Outpatient Medications  Medication Sig Dispense Refill   albuterol (VENTOLIN HFA) 108 (90 Base) MCG/ACT inhaler Inhale 2 puffs into the lungs every 6 (six) hours as needed for wheezing or shortness of breath. 8.5 g 3   apixaban (ELIQUIS) 5 MG TABS tablet Take 1 tablet (5 mg total) by mouth 2 (two) times daily. 180 tablet 1   Chlorphen-Phenyleph-ASA (ALKA-SELTZER PLUS COLD PO) Take 2 tablets by mouth daily as needed (allergies).      clopidogrel (PLAVIX) 75 MG tablet Take 1 tablet by mouth once daily 90 tablet 0   dapagliflozin propanediol (FARXIGA) 10 MG TABS tablet Take 1 tablet (10 mg total) by mouth daily before breakfast. (Patient taking differently: Take 5 mg by mouth daily before breakfast.) 90 tablet 3   DULoxetine (CYMBALTA) 30 MG capsule Take 1 capsule by mouth once daily 90 capsule 0   ezetimibe (ZETIA) 10 MG tablet Take 1 tablet (10 mg total) by mouth daily. 90 tablet 3   fenofibrate 160 MG tablet TAKE 1 TABLET BY MOUTH ONCE DAILY FOR CHOLESTEROL AND TRIGLYCERIDES 90 tablet 3   furosemide (LASIX) 20 MG tablet Take Two Times Daily for 7 Days then take Daily (Patient taking differently: Take 20 mg by mouth daily as needed for fluid.) 97 tablet 3   gabapentin (NEURONTIN) 300 MG capsule Take 300 mg by mouth 3 (three) times daily.     HYDROcodone-acetaminophen (NORCO) 5-325 MG tablet Take 1-2 tablets by mouth every 6 (six) hours as needed for moderate pain. 20 tablet 0   insulin lispro (HUMALOG KWIKPEN) 100 UNIT/ML KwikPen Inject 15-20 Units into the skin 3 (three) times daily with meals. (Patient taking differently: Inject 15 Units into the skin 3 (three) times daily with meals. Sliding scale) 15 mL 11   insulin NPH-regular Human (70-30) 100 UNIT/ML injection Inject 60 Units into the skin 2 (two) times daily with a meal.     isosorbide mononitrate (IMDUR) 30 MG 24 hr tablet Take 1 tablet by  mouth once daily 90 tablet 0   linaclotide (LINZESS) 72 MCG capsule Take 1 capsule (72 mcg total) by mouth daily as needed (constipation). 90 capsule 3   Menthol, Topical Analgesic, (BLUE-EMU MAXIMUM STRENGTH EX) Apply 1 application topically daily as needed (pain).     metoprolol succinate (TOPROL-XL) 50 MG 24 hr tablet Take 1 tablet by mouth once daily 90 tablet 3   metoprolol tartrate (LOPRESSOR) 50 MG tablet Take 50 mg by mouth daily as needed (a-fib).     nitroGLYCERIN (NITROSTAT) 0.4 MG SL tablet DISSOLVE ONE TABLET UNDER THE  TONGUE EVERY 5 MINUTES AS NEEDED FOR CHEST PAIN.  DO NOT EXCEED A TOTAL OF 3 DOSES IN 15 MINUTES 25 tablet 0   pantoprazole (PROTONIX) 40 MG tablet Take 1 tablet by mouth once daily 90 tablet 1   vitamin B-12 (CYANOCOBALAMIN) 1000 MCG tablet Take 1,000 mcg by mouth daily.     tirzepatide [facility]) 2.5 MG/0.5ML Pen Inject 2.5 mg into the skin once a week. 2 mL 0   tirzepatide (MOUNJARO) 5 MG/0.5ML Pen Inject 5 mg into the skin once a week. 2 mL 0   tirzepatide (MOUNJARO) 7.5 MG/0.5ML Pen Inject 7.5 mg into the skin once a week. 2 mL 0   No current facility-administered medications for this visit.    Allergies as of 02/24/2021 - Review Complete 02/24/2021  Allergen Reaction Noted   Shellfish allergy Anaphylaxis and Other (See Comments) 09/06/2014   Sulfa antibiotics Anaphylaxis and Rash 09/06/2014   Ace inhibitors Other (See Comments) and Cough 08/09/2013   Escitalopram Other (See Comments) 01/13/2016   Evolocumab Other (See Comments) 01/04/2017   Fenofibrate Other (See Comments) 11/14/2015   Invokana [canagliflozin] Other (See Comments) 09/04/2013   Lisinopril Cough 10/04/2014   Metformin and related Itching 07/12/2013   Pravastatin sodium Other (See Comments) 11/14/2014   Milk-related compounds  04/06/2019   Crestor [rosuvastatin] Other (See Comments) 06/05/2013   Horse-derived products Rash 07/25/2008   Lac bovis Nausea Only 04/06/2019   Lexapro [escitalopram oxalate] Other (See Comments) 01/13/2016   Lipitor [atorvastatin] Other (See Comments) 06/05/2013   Livalo [pitavastatin] Other (See Comments) 06/25/2016   Tape Rash 07/25/2008    Vitals: BP (!) 142/77   Pulse 84   Ht 6\' 2"  (1.88 m)   Wt 298 lb 3.2 oz (135.3 kg)   BMI 38.29 kg/m  Last Weight:  Wt Readings from Last 1 Encounters:  02/24/21 298 lb 3.2 oz (135.3 kg)   Last Height:   Ht Readings from Last 1 Encounters:  02/24/21 6\' 2"  (1.88 m)   Exam: NAD, pleasant                  Speech:    Speech is normal;  fluent and spontaneous with normal comprehension.  Cognition:    The patient is oriented to person, place, and time;     recent and remote memory intact;     language fluent;    Cranial Nerves:    The pupils are equal, round, and reactive to light.Trigeminal sensation is intact and the muscles of mastication are normal. The face is symmetric. The palate elevates in the midline. Hearing intact. Voice is normal. Shoulder shrug is normal. The tongue has normal motion without fasciculations.   Coordination:  No dysmetria. Walks with walking aid, wide based.  Motor Observation:    No asymmetry, no atrophy, and no involuntary movements noted. Tone:    Normal muscle tone.     Strength:  Strength is V/V in the upper and lower limbs.      Sensation: decreased distally in LE in all sensory modalities.   DTRs: Absent AJs   Assessment/Plan: Absolutely delightful gentleman with his lovely wife here again for TIA and memory loss, we have seen him before for multiple complaints here for follow up after TIA in the setting of stopping his Eliquis for surgery. Extensive PMHx, and has been complaining of memory loss for several years to me  Patient here for follow up of TIA in July. He has a past medical history of untreated sleep apnea, morbid obesity, B12 deficiency on supplementation, syncope, depression, intolerance to statin, leg ulcers status post popliteal bypass graft and femoral artery stenting.  Also cerebrovascular disease, headaches, chronic kidney disease, peripheral vascular disease, myocardial infarction status post stent July 30, 2016 follows with cardiology and is in a clinical trial for hyperlipidemia, followed by pain management every 2 to 3 months, chronic kidney disease followed by nephrology, followed by vascular and had stent placement in his right leg,, medical noncompliance, hypertension, hyperlipidemia, diabetes, depression, coronary artery disease, arthritis, anxiety,  cerebrovascular disease, congestive heart failure, A. fib, diabetic peripheral neuropathy, lacunar infarct, degenerative disc disease, drug-induced myopathy, obesity, former smoker with greater than 30-pack-year history.  He declines to wear CPAP.  We have evaluated him in the past for multiple other disorders including cerebrovascular disease, lacunar strokes, noncompliance with CPAP and untreated obstructive sleep apnea with morning headaches.syncope an pre-syncope in the setting of hypotension as low as 80/56 improved with management of blood pressure medications last episodes in June. Today appears to be fine.    - Chronic diffuse cerebrovascular disease would avoid hypotension goal >027 systolic. Patient's last MRA head and neck was concerning for new left ICA high-grade stenosis in the neck with downstream MCA decreased flow(not consistent with his left-sided numbness and TIA).  Dr. Erlinda Hong recommend CTA head and neck for further evaluation of cerebral vasculature outpatient. Even if I could convince him to get a CTA, at this point unclear if Dr. [name] would even consider intervention, reached out to him. Patient likely won't agree to to CTA or [place] he states.  - Memory loss: I don;t appreciate neurodegenerative disease ossible MCI but given his extensive medical history and FHx of alzheimers and his repeated requests for testing will accommodate, could be multifactorial including medications, morbid obesity, sedentary lifestyle, untreated sleep apnea. He has a family history of dementia and has expressed concerns to me for several years. At this time I will refer him to formal neurocognitive testing for baseline.   -syncope in the past: Patient with orthostatic hypotension discussed causes and treatments: discussed (see below). Needs continues follow up with primary care for medication management.   - He has untreated sleep apnea and declines being treated, We spent an extended amount of time discussing  it again today. He has said in the past he will accept the consequences of his untreated sleep apnea and morbid obesity and lifestyle and I told him these may be some of the consequences but not all, increased risk of stroke and dementia and others   - Discussed weight loss again. Still declines referral to [facility] [place] again today  - manage diabetes closely, hga1c  goal is <7),   -  cholesterol discussed ldl goal < 70, on a clinical trial for his cholesterol - continue plavix and eliquis for stroke prevention - Afib: continue eliquis   Stroke an dvascular risk factors:  I had a long d/w patient about his stroke risk, risk for recurrent stroke/TIAs, personally independently reviewed imaging studies and stroke evaluation results and answered questions.Continue Plavix and Eliquis for secondary stroke prevention and maintain strict control of hypertension with blood pressure goal below 130/90, diabetes with hemoglobin A1c goal below 6.5% and lipids with LDL cholesterol goal below 70 mg/dL I also advised the patient to eat a healthy diet with plenty of whole grains, cereals, fruits and vegetables, exercise regularly and maintain ideal body weight .Followup in the future with me in  6 months or call earlier if necessary. With diffuse cerebrovascular disease would avoid hypotension. Discussed smoking cessation and obesity. Highly encouraged him to manage his vascular risk factors which are extensive. He is in a trial for his hyperlipidemia.   Discussed: Non-Drug Treatment for Low Blood Pressure on Standing in the past Orthostatic hypotension: 1. Changing Postures:  Change posture slowly when getting up, especially in the morning  Hold on to something during the first few minutes after standing up. Do not start walking as soon as you get up from the chair  Avoid prolonged recumbency or lying down  Raise the head of the bed by 10 to 20 degrees  2. Exercise:  Perform Isotonic exercise,  e.g.recumbent bike, pedaling movements while sitting in a chair  Avoid exercises where you have to strain   3. Avoid Pooling of blood in legs:  Wear custom-fitted elastic stockings. The ones which extend to the abdomen work even better. Consider wearing an abdominal binder.  Perform physical counter-maneuvers, such as crossing legs and tensing leg muscles.  4. Eating and Drinking:  Small meals are recommended. Avoid large meals. Avoid standing suddenly after a large meal  Avoid alcohol  Increase intake of fluids and regular salt. A daily intake of up to 10 grams of sodium per day and a fluid intake of 2.0 to 2.5 liters per day (8 to 10 glasses of water) is recommended.   Rapid (over 3 minutes) ingestion of approximately 0.5 liter (2 glasses of water) of tap water, raises blood pressure within 5 to 15 minutes and lasts for an hour.  5. Other Tips:  Avoid hot baths. Instead take warm baths  Maintain a BP record standing and lying down  If you are only any BP lowering drugs (antihypertensives, diuretics, antidepressants, drugs for prostate, etc) ask your doctor to revisit the need to keep you on these drugs  If all non-drug therapy fails, ask your doctor about drug therapy   Follow-up with primary care physician.    Sarina Ill, MD  [name] [address] [place], [place] [address]  Phone [phone] Fax [phone]  I spent more than 40 minutes of face-to-face and non-face-to-face time with patient on the  1. Cerebrovascular disease   2. Memory loss   3. Morbid obesity ([place])   4. Lacunar infarction (HCC)   5. Paroxysmal atrial fibrillation (HCC)   6. Stenosis of carotid artery, unspecified laterality   7. Hyperlipidemia associated with type 2 diabetes mellitus ([place])   8. Diabetic polyneuropathy associated with diabetes mellitus due to underlying condition ([place])   9. TIA (transient ischemic attack)     diagnosis.  This included previsit  chart review, lab review, study review, order entry, electronic health record documentation, patient education on the different diagnostic and therapeutic options, counseling and coordination of care, risks and benefits of management, compliance, or risk factor reduction

## 2021-02-25 DIAGNOSIS — H35373 Puckering of macula, bilateral: Secondary | ICD-10-CM | POA: Diagnosis not present

## 2021-02-25 DIAGNOSIS — H43813 Vitreous degeneration, bilateral: Secondary | ICD-10-CM | POA: Diagnosis not present

## 2021-02-25 DIAGNOSIS — E113491 Type 2 diabetes mellitus with severe nonproliferative diabetic retinopathy without macular edema, right eye: Secondary | ICD-10-CM | POA: Diagnosis not present

## 2021-02-25 DIAGNOSIS — H4312 Vitreous hemorrhage, left eye: Secondary | ICD-10-CM | POA: Diagnosis not present

## 2021-02-25 DIAGNOSIS — E113592 Type 2 diabetes mellitus with proliferative diabetic retinopathy without macular edema, left eye: Secondary | ICD-10-CM | POA: Diagnosis not present

## 2021-03-10 ENCOUNTER — Telehealth: Payer: Self-pay | Admitting: *Deleted

## 2021-03-10 ENCOUNTER — Other Ambulatory Visit: Payer: Self-pay | Admitting: Vascular Surgery

## 2021-03-10 ENCOUNTER — Other Ambulatory Visit: Payer: Self-pay | Admitting: Cardiovascular Disease

## 2021-03-10 DIAGNOSIS — I480 Paroxysmal atrial fibrillation: Secondary | ICD-10-CM

## 2021-03-10 NOTE — Telephone Encounter (Signed)
Prescription refill request for Eliquis received. Indication: Afib  Last office visit: 12/05/20 [name])  Scr: 1.50 (02/21/21) Age: 70 Weight: 135.3kg  Appropriate dose and refill sent to requested pharmacy.

## 2021-03-10 NOTE — Telephone Encounter (Signed)
-----   Message from Melvenia Beam, MD sent at 03/10/2021  4:26 PM EDT ----- This is a follow up from our last appointment where I told patients I would contact Dr. [name] and ask about the carotid studies. Dr. [name] recommmended a carotid doppler for this patient to further examine the carotid stenosis that was seen recently on MRA. Patient has been resistent to having any further testing, so please just let him know what Dr. [name] said and it is his choice whether he does want to have it. thanks   ----- Message ----- From: [name], MD Sent: 02/28/2021   7:15 AM EDT To: Melvenia Beam, MD  MRI is notorious and being inaccurate for extracranial carotid stenosis.  If his symptoms are not consistent with a carotid lesion, I think a carotid duplex would be sufficient.  If the symptoms are consistent with a potential carotid lesion then CTA may be beneficial, however I would still want him to have a duplex.  Thanks ----- Message ----- From: Melvenia Beam, MD Sent: 02/24/2021   2:49 PM EDT To: [name], MD  Dr. [name]: Patient's last MRA head and neck was concerning for new left ICA high-grade stenosis in the neck with downstream MCA decreased flow(not consistent with his left-sided numbness and TIA).  Dr. Erlinda Hong recommend CTA head and neck for further evaluation of cerebral vasculature outpatient. Even if I could convince him to get a CTA, at this point would you consider any intervention? I saw him today with his wife. Thought I'd ask before I pushed him a bit, if there is no intervention then why do it. Thanks [name]

## 2021-03-10 NOTE — Telephone Encounter (Signed)
I called pt relayed message from Dr. Jaynee Eagles after she connected with Dr. [name] about carotid doppler.  Per Dr. [name] recommended to have have done to check due to noting carotid stenosis on MRA.  Pt was ok to have done as outpt.

## 2021-03-11 ENCOUNTER — Other Ambulatory Visit: Payer: Self-pay | Admitting: Neurology

## 2021-03-11 DIAGNOSIS — I6529 Occlusion and stenosis of unspecified carotid artery: Secondary | ICD-10-CM

## 2021-03-13 DIAGNOSIS — R809 Proteinuria, unspecified: Secondary | ICD-10-CM | POA: Diagnosis not present

## 2021-03-13 DIAGNOSIS — E211 Secondary hyperparathyroidism, not elsewhere classified: Secondary | ICD-10-CM | POA: Diagnosis not present

## 2021-03-13 DIAGNOSIS — E1129 Type 2 diabetes mellitus with other diabetic kidney complication: Secondary | ICD-10-CM | POA: Diagnosis not present

## 2021-03-13 DIAGNOSIS — R718 Other abnormality of red blood cells: Secondary | ICD-10-CM | POA: Diagnosis not present

## 2021-03-13 DIAGNOSIS — N189 Chronic kidney disease, unspecified: Secondary | ICD-10-CM | POA: Diagnosis not present

## 2021-03-13 DIAGNOSIS — I129 Hypertensive chronic kidney disease with stage 1 through stage 4 chronic kidney disease, or unspecified chronic kidney disease: Secondary | ICD-10-CM | POA: Diagnosis not present

## 2021-03-13 DIAGNOSIS — D638 Anemia in other chronic diseases classified elsewhere: Secondary | ICD-10-CM | POA: Diagnosis not present

## 2021-03-13 DIAGNOSIS — I5032 Chronic diastolic (congestive) heart failure: Secondary | ICD-10-CM | POA: Diagnosis not present

## 2021-03-13 DIAGNOSIS — E1122 Type 2 diabetes mellitus with diabetic chronic kidney disease: Secondary | ICD-10-CM | POA: Diagnosis not present

## 2021-03-23 ENCOUNTER — Other Ambulatory Visit: Payer: Self-pay | Admitting: Family

## 2021-03-25 DIAGNOSIS — M47816 Spondylosis without myelopathy or radiculopathy, lumbar region: Secondary | ICD-10-CM | POA: Diagnosis not present

## 2021-03-25 DIAGNOSIS — Z79891 Long term (current) use of opiate analgesic: Secondary | ICD-10-CM | POA: Diagnosis not present

## 2021-03-25 DIAGNOSIS — M5136 Other intervertebral disc degeneration, lumbar region: Secondary | ICD-10-CM | POA: Diagnosis not present

## 2021-03-26 ENCOUNTER — Telehealth: Payer: Self-pay | Admitting: *Deleted

## 2021-03-26 NOTE — Telephone Encounter (Signed)
Pharmacy, can you please comment on how long Eliquis can be held for upcoming procedure?  Thank you! 

## 2021-03-26 NOTE — Telephone Encounter (Signed)
   Pre-operative Risk Assessment    Patient Name: [name] [name]  DOB: 21-May-1950 MRN: [id number]      Request for Surgical Clearance   Procedure:   LEFT L4-5, L5-S1 TFESI  Date of Surgery: Clearance TBD                                 Surgeon:  DR. Dorene Ar Surgeon's Group or Practice Name:  [facility] Phone number:  [phone] Fax number:  [phone]   Type of Clearance Requested: - Medical  - Pharmacy:  Hold Apixaban (Eliquis) x 3 DAYS PRIOR TO PROCEDURE   Type of Anesthesia:   Not Indicated   Additional requests/questions:   [name]   03/26/2021, 1:13 PM

## 2021-03-27 NOTE — Telephone Encounter (Signed)
Patient with diagnosis of afib on Eliquis for anticoagulation.    Procedure: left L4-5, L5-S1 ESI Date of procedure: TBD   CHA2DS2-VASc Score = 7  This indicates a 11.2% annual risk of stroke. The patient's score is based upon: CHF History: 1 HTN History: 1 Diabetes History: 1 Stroke History: 2 Vascular Disease History: 1 Age Score: 1 Gender Score: 0   CVA 05/2016, imaging showed basal ganglia lacunar infarct.  CrCl 10mL/min using adjusted body weight Platelet count 349K  Previously cleared by Dr [name] 11/04/20 note for 3 day hold for previous procedure without bridging. Per office protocol, patient can hold Eliquis for 3 days prior to procedure. Resume as soon as safely possible after.

## 2021-03-27 NOTE — Telephone Encounter (Signed)
Left voice mail to call back 

## 2021-03-31 ENCOUNTER — Encounter: Payer: Self-pay | Admitting: Family

## 2021-03-31 ENCOUNTER — Ambulatory Visit (INDEPENDENT_AMBULATORY_CARE_PROVIDER_SITE_OTHER): Payer: Medicare Other | Admitting: Family

## 2021-03-31 ENCOUNTER — Ambulatory Visit: Payer: Medicare Other | Admitting: Surgery

## 2021-03-31 DIAGNOSIS — Z20822 Contact with and (suspected) exposure to covid-19: Secondary | ICD-10-CM

## 2021-03-31 MED ORDER — MOLNUPIRAVIR EUA 200MG CAPSULE: 4.0000 | ORAL_CAPSULE | Freq: Two times a day (BID) | ORAL | 0 refills | Status: AC

## 2021-03-31 NOTE — Telephone Encounter (Signed)
   Name: DAIVION PAPE  DOB: 06/29/1950  MRN: [id number]   Primary Cardiologist: Jenkins Rouge, MD  Chart reviewed as part of pre-operative protocol coverage. Patient was contacted 03/31/2021 in reference to pre-operative risk assessment for pending surgery as outlined below.  Jari A Kohlmann was last seen 11/2020 by Dr. [name]. I reached out to patient for update on how he is doing. The patient affirms he has been doing well without any new cardiac symptoms or recent cardioversion procedures. Therefore, based on ACC/AHA guidelines, the patient would be at acceptable risk for the planned procedure without further cardiovascular testing. The patient was advised that if he develops new symptoms prior to surgery to contact our office to arrange for a follow-up visit, and he verbalized understanding. I do see per EMR he saw PCP today for Covid so would defer to team doing the procedure when this should be scheduled with relationship to his infection.   Per pharmD team, regarding anticoagulation, the patient was "Previously cleared by Dr [name] 11/04/20 note for 3 day hold for previous procedure without bridging. Per office protocol, patient can hold Eliquis for 3 days prior to procedure. Resume as soon as safely possible after"  I will route this recommendation to the requesting party via Epic fax function and remove from pre-op pool. Please call with questions.  Charlie Pitter, PA-C 03/31/2021, 2:07 PM

## 2021-03-31 NOTE — Progress Notes (Signed)
Virtual Visit  Note Due to COVID-19 pandemic this visit was conducted virtually. This visit type was conducted due to national recommendations for restrictions regarding the COVID-19 Pandemic (e.g. social distancing, sheltering in place) in an effort to limit this patient's exposure and mitigate transmission in our community. All issues noted in this document were discussed and addressed.  A physical exam was not performed with this format.  I connected with [name] [name] on 03/31/21 at 11:26 AM by telephone and verified that I am speaking with the correct person using two identifiers. [name] [name] is currently located at home and his wife is currently with him during visit. The provider, [name], FNP is located in their office at time of visit.  I discussed the limitations, risks, security and privacy concerns of performing an evaluation and management service by telephone and the availability of in person appointments. I also discussed with the patient that there may be a patient responsible charge related to this service. The patient expressed understanding and agreed to proceed.  [name] [name], [name] [name] are scheduled for a virtual visit with your provider today.    Just as we do with appointments in the office, we must obtain your consent to participate.  Your consent will be active for this visit and any virtual visit you may have with one of our providers in the next 365 days.    If you have a MyChart account, I can also send a copy of this consent to you electronically.  All virtual visits are billed to your insurance company just like a traditional visit in the office.  As this is a virtual visit, video technology does not allow for your provider to perform a traditional examination.  This may limit your provider's ability to fully assess your condition.  If your provider identifies any concerns that need to be evaluated in person or the need to arrange testing such as labs, EKG, etc, we will make  arrangements to do so.    Although advances in technology are sophisticated, we cannot ensure that it will always work on either your end or our end.  If the connection with a video visit is poor, we may have to switch to a telephone visit.  With either a video or telephone visit, we are not always able to ensure that we have a secure connection.   I need to obtain your verbal consent now.   Are you willing to proceed with your visit today?   [name] has provided verbal consent on 03/31/2021 for a virtual visit (video or telephone).   [name], [place] 03/31/2021  11:27 AM    History and Present Illness:  Pt calls the office today with cough that started yesterday. His wife tested positive for COVID last night. Cough This is a new problem. The current episode started yesterday. The problem has been gradually worsening. The problem occurs every few minutes. The cough is Non-productive. Associated symptoms include chills, ear pain, a fever, headaches, myalgias, nasal congestion, postnasal drip, rhinorrhea, shortness of breath and wheezing. Pertinent negatives include no ear congestion. The symptoms are aggravated by lying down. He has tried rest and OTC cough suppressant for the symptoms. The treatment provided moderate relief.     Review of Systems  Constitutional:  Positive for chills and fever.  HENT:  Positive for ear pain, postnasal drip and rhinorrhea.   Respiratory:  Positive for cough, shortness of breath and wheezing.   Musculoskeletal:  Positive for myalgias.  Neurological:  Positive for headaches.    Observations/Objective: No SOB or distress noted   Assessment and Plan: 1. Suspected COVID-19 virus infection COVID positive, rest, force fluids, tylenol as needed, Quarantine for at least 5 days and you are fever free, then must wear a mask out in public from day 8-11, report any worsening symptoms such as increased shortness of breath, swelling, or continued high fevers.  Possible adverse effects discussed with antivirals.  - molnupiravir EUA (LAGEVRIO) 200 mg CAPS capsule; Take 4 capsules (800 mg total) by mouth 2 (two) times daily for 5 days.  Dispense: 40 capsule; Refill: 0  2. Exposure to COVID-19 virus - molnupiravir EUA (LAGEVRIO) 200 mg CAPS capsule; Take 4 capsules (800 mg total) by mouth 2 (two) times daily for 5 days.  Dispense: 40 capsule; Refill: 0    I discussed the assessment and treatment plan with the patient. The patient was provided an opportunity to ask questions and all were answered. The patient agreed with the plan and demonstrated an understanding of the instructions.   The patient was advised to call back or seek an in-person evaluation if the symptoms worsen or if the condition fails to improve as anticipated.  The above assessment and management plan was discussed with the patient. The patient verbalized understanding of and has agreed to the management plan. Patient is aware to call the clinic if symptoms persist or worsen. Patient is aware when to return to the clinic for a follow-up visit. Patient educated on when it is appropriate to go to the emergency department.   Time call ended:  11:37 Am  I provided 11 minutes of  non face-to-face time during this encounter.    [name], FNP

## 2021-03-31 NOTE — Telephone Encounter (Signed)
   Patient Name: [name] [name]  DOB: 12-Feb-1951 MRN: [id number]  Primary Cardiologist: Jenkins Rouge, MD  Chart revisited as part of pre-operative protocol coverage. Also asked to hold Plavix for 7 days prior to procedure. Patient had last cardiac stent in 05/2020. Per prior clearance notes in 06/2020 sent for steroid injection, "Due to the patient's recent coronary stenting 05/24/2020 he is felt to be high risk for in stent restenosis if his Plavix is stopped.  Dr [name] feels the risk would be acceptable early April but holding off closer to 6 months would be optimal." In his last office note 12/05/20, Dr. [name] stated "He has significant back problems and may need surgery  Or injection I told him the earlierst This could be done is July given recent stent and need for plavix." July 2022 was the 6 month mark post PCI. Since it is now after July we are now OK to hold Plavix per Dr. Kyla Balzarine recommendation from cardiac standpoint - however, recommend the surgical team also get clearance to hold Plavix from patient's vascular surgeon Dr. [name] as the patient underwent PAD surgery with left common femoral to above-knee popliteal artery bypass graft with reversed ipsilateral translocated saphenous vein and R SFA stenting as recently as 11/2020.   Will route this bundled recommendation to requesting provider via Epic fax function. Please call with questions.  Charlie Pitter, PA-C 03/31/2021, 4:02 PM

## 2021-03-31 NOTE — Telephone Encounter (Signed)
Clearance request received. Looks like the requesting office is needing to know about Plavix as well. I will send to pre op provider to please address.   Asking to hold Plavix x 7 days prior to procedure

## 2021-03-31 NOTE — Telephone Encounter (Signed)
Our office has received another clearance request (duplicate). I will update the requesting office that pre op provider left a message Friday for pre op assessment, pt has not called back. I will update the pre op provider today our office received duplicate request.

## 2021-04-08 ENCOUNTER — Other Ambulatory Visit: Payer: Self-pay | Admitting: Family

## 2021-04-08 DIAGNOSIS — K219 Gastro-esophageal reflux disease without esophagitis: Secondary | ICD-10-CM

## 2021-04-21 ENCOUNTER — Ambulatory Visit: Payer: Medicare Other | Admitting: Surgery

## 2021-04-21 ENCOUNTER — Encounter: Payer: Self-pay | Admitting: Surgery

## 2021-04-21 ENCOUNTER — Other Ambulatory Visit: Payer: Self-pay

## 2021-04-21 VITALS — BP 126/59 | HR 79 | Temp 97.8°F | Resp 20 | Ht 74.0 in | Wt 298.0 lb

## 2021-04-21 DIAGNOSIS — I7025 Atherosclerosis of native arteries of other extremities with ulceration: Secondary | ICD-10-CM | POA: Diagnosis not present

## 2021-04-21 NOTE — Progress Notes (Signed)
Vascular and Vein Specialist of [facility]  Patient name: [name] [name] MRN: [id number] DOB: April 16, 1951 Sex: male   REASON FOR VISIT:    Follow up  HISOTRY OF PRESENT ILLNESS:    [name] [name] is a 70 y.o. male with bilateral lower extremity wounds who underwent left femoral to above-knee popliteal artery bypass graft with vein and right superficial femoral artery stenting on 11/22/2020.  He states that he now has feeling in his feet.   At his first postoperative visit, his ABIs were essentially unchanged and so I sent him for angiography.  This showed a widely patent femoral-popliteal bypass graft on the left with single-vessel runoff via the peroneal artery which was mild to moderately diseased.  The tibial vessels on the right side were all occluded.  He comes back in today for a wound check.  He has history of coronary artery disease.  He underwent DES in 2010 in 2018.  He suffers from atrial fibrillation.  He is on Eliquis and has a loop recorder implanted.  He is statin and Repatha intolerant.  He is currently involved in a research study to address his hypercholesterolemia.  He is a diabetic   PAST MEDICAL HISTORY:   Past Medical History:  Diagnosis Date   Anxiety    Arthritis    Atrial fibrillation ([place])    CAD (coronary artery disease)    a. 2010: DES to CTO of RCA. EF 55% b. 07/2016: cath showing total occlusion within previously placed RCA stent (collaterals present), severe stenosis along LCx and OM1 (treated with 2 overlapping DES). c. repeat cath in 01/2018 showing patent stents along LCx and OM with CTO of D2, CTO of distal LCx, and CTO of RCA with collaterals present overall unchanged since 2018 with medical management recom   Cellulitis and abscess rt groin    Complication of anesthesia    " I woke up during a colonoscopy "      Depression    Diabetes mellitus    Diastolic CHF ([place])    Disorders of iron metabolism    Dysrhythmia     GERD (gastroesophageal reflux disease)    Hyperlipidemia    Hypertension    Low serum testosterone level    Medically noncompliant    Myocardial infarction [facility])    05-23-20     FAMILY HISTORY:   Family History  Problem Relation Age of Onset   Diabetes Father    Valvular heart disease Father    Arthritis Father    Heart disease Father    Stroke Father    Alzheimer's disease Mother    Hyperlipidemia Mother    Hypertension Mother    Arthritis Mother    Lung cancer Mother    Stroke Mother    Headache Mother    Arthritis/Rheumatoid Sister    Diabetes Sister    Hypertension Sister    Hyperlipidemia Sister    Depression Sister    Dementia Maternal Aunt    Dementia Maternal Uncle    Heart disease Maternal Uncle    Stomach cancer Paternal Uncle    Colon cancer Neg Hx    Liver disease Neg Hx     SOCIAL HISTORY:   Social History   Tobacco Use   Smoking status: Former    Packs/day: 0.25    Years: 51.00    Pack years: 12.75    Types: Cigarettes    Quit date: 08/14/2016    Years since quitting: 4.6   Smokeless tobacco:  Never   Tobacco comments:    smokes  a pack a week  Substance Use Topics   Alcohol use: No    Alcohol/week: 0.0 standard drinks     ALLERGIES:   Allergies  Allergen Reactions   Shellfish Allergy Anaphylaxis and Other (See Comments)    Tongue swelling, hives    Sulfa Antibiotics Anaphylaxis and Rash    Tongue swelling, hives   Ace Inhibitors Other (See Comments) and Cough    CKD, renal failure    Escitalopram Other (See Comments)    Buzzing in ears,headache, felt like a zombie    Evolocumab Other (See Comments)    Myalgias, flu like sx    Fenofibrate Other (See Comments)    Body aches - pt currently taking isnt sure if its causing any pain    Invokana [Canagliflozin] Other (See Comments)    Syncope / dehydration   Lisinopril Cough   Metformin And Related Itching   Pravastatin Sodium Other (See Comments)    myalgias   Milk-Related  Compounds     Ties stomach in knots    Crestor [Rosuvastatin] Other (See Comments)    Myalgias    Horse-Derived Products Rash   Lac Bovis Nausea Only    Ties stomach in knots     Lexapro [Escitalopram Oxalate] Other (See Comments)    Buzzing in ears,headache, felt like a zombie   Lipitor [Atorvastatin] Other (See Comments)    myalgias   Livalo [Pitavastatin] Other (See Comments)    Myalgias    Tape Rash     CURRENT MEDICATIONS:   Current Outpatient Medications  Medication Sig Dispense Refill   albuterol (VENTOLIN HFA) 108 (90 Base) MCG/ACT inhaler Inhale 2 puffs into the lungs every 6 (six) hours as needed for wheezing or shortness of breath. 8.5 g 3   Chlorphen-Phenyleph-ASA (ALKA-SELTZER PLUS COLD PO) Take 2 tablets by mouth daily as needed (allergies).     clopidogrel (PLAVIX) 75 MG tablet Take 1 tablet by mouth once daily 90 tablet 0   dapagliflozin propanediol (FARXIGA) 10 MG TABS tablet Take 1 tablet (10 mg total) by mouth daily before breakfast. (Patient taking differently: Take 5 mg by mouth daily before breakfast.) 90 tablet 3   DULoxetine (CYMBALTA) 30 MG capsule Take 1 capsule by mouth once daily 90 capsule 0   ELIQUIS 5 MG TABS tablet Take 1 tablet by mouth twice daily 180 tablet 0   ezetimibe (ZETIA) 10 MG tablet Take 1 tablet (10 mg total) by mouth daily. 90 tablet 3   fenofibrate 160 MG tablet TAKE 1 TABLET BY MOUTH ONCE DAILY FOR CHOLESTEROL AND TRIGLYCERIDES 90 tablet 3   furosemide (LASIX) 20 MG tablet Take Two Times Daily for 7 Days then take Daily (Patient taking differently: Take 20 mg by mouth daily as needed for fluid.) 97 tablet 3   gabapentin (NEURONTIN) 300 MG capsule Take 300 mg by mouth 3 (three) times daily.     HYDROcodone-acetaminophen (NORCO) 5-325 MG tablet Take 1-2 tablets by mouth every 6 (six) hours as needed for moderate pain. 20 tablet 0   insulin lispro (HUMALOG KWIKPEN) 100 UNIT/ML KwikPen Inject 15-20 Units into the skin 3 (three) times  daily with meals. (Patient taking differently: Inject 15 Units into the skin 3 (three) times daily with meals. Sliding scale) 15 mL 11   insulin NPH-regular Human (70-30) 100 UNIT/ML injection Inject 60 Units into the skin 2 (two) times daily with a meal.     isosorbide mononitrate (IMDUR)  30 MG 24 hr tablet Take 1 tablet by mouth once daily 90 tablet 0   linaclotide (LINZESS) 72 MCG capsule Take 1 capsule (72 mcg total) by mouth daily as needed (constipation). 90 capsule 3   Menthol, Topical Analgesic, (BLUE-EMU MAXIMUM STRENGTH EX) Apply 1 application topically daily as needed (pain).     metoprolol succinate (TOPROL-XL) 50 MG 24 hr tablet Take 1 tablet by mouth once daily 90 tablet 3   metoprolol tartrate (LOPRESSOR) 50 MG tablet Take 50 mg by mouth daily as needed (a-fib).     MOUNJARO 2.5 MG/0.5ML Pen INJECT 2.5MG  SUBCUTANEOUSLY ONCE A WEEK 4 mL 2   nitroGLYCERIN (NITROSTAT) 0.4 MG SL tablet DISSOLVE ONE TABLET UNDER THE TONGUE EVERY 5 MINUTES AS NEEDED FOR CHEST PAIN.  DO NOT EXCEED A TOTAL OF 3 DOSES IN 15 MINUTES 25 tablet 0   pantoprazole (PROTONIX) 40 MG tablet Take 1 tablet by mouth once daily 90 tablet 0   tirzepatide (MOUNJARO) 5 MG/0.5ML Pen Inject 5 mg into the skin once a week. 2 mL 0   tirzepatide (MOUNJARO) 7.5 MG/0.5ML Pen Inject 7.5 mg into the skin once a week. 2 mL 0   vitamin B-12 (CYANOCOBALAMIN) 1000 MCG tablet Take 1,000 mcg by mouth daily.     No current facility-administered medications for this visit.    REVIEW OF SYSTEMS:   [X]  denotes positive finding, [ ]  denotes negative finding Cardiac  Comments:  Chest pain or chest pressure:    Shortness of breath upon exertion:    Short of breath when lying flat:    Irregular heart rhythm:        Vascular    Pain in calf, thigh, or hip brought on by ambulation:    Pain in feet at night that wakes you up from your sleep:     Blood clot in your veins:    Leg swelling:         Pulmonary    Oxygen at home:     Productive cough:     Wheezing:         Neurologic    Sudden weakness in arms or legs:     Sudden numbness in arms or legs:     Sudden onset of difficulty speaking or slurred speech:    Temporary loss of vision in one eye:     Problems with dizziness:         Gastrointestinal    Blood in stool:     Vomited blood:         Genitourinary    Burning when urinating:     Blood in urine:        Psychiatric    Major depression:         Hematologic    Bleeding problems:    Problems with blood clotting too easily:        Skin    Rashes or ulcers:        Constitutional    Fever or chills:      PHYSICAL EXAM:   There were no vitals filed for this visit.  GENERAL: The patient is a well-nourished male, in no acute distress. The vital signs are documented above. CARDIAC: There is a regular rate and rhythm.  VASCULAR: Nonpalpable pedal pulses PULMONARY: Non-labored respirations MUSCULOSKELETAL: There are no major deformities or cyanosis. NEUROLOGIC: No focal weakness or paresthesias are detected. SKIN: See photo below PSYCHIATRIC: The patient has a normal affect.   STUDIES:   None  MEDICAL ISSUES:   Plan for follow up in 3 months with vascular lab studies and wound check    Leia Alf, MD, FACS [facility] and Vein Specialists of [facility] [phone] Pager [phone]

## 2021-04-22 ENCOUNTER — Encounter: Payer: Self-pay | Admitting: Internal Medicine

## 2021-04-22 ENCOUNTER — Ambulatory Visit (INDEPENDENT_AMBULATORY_CARE_PROVIDER_SITE_OTHER): Payer: Medicare Other | Admitting: Internal Medicine

## 2021-04-22 VITALS — BP 154/96 | HR 79 | Ht 74.0 in | Wt 296.4 lb

## 2021-04-22 DIAGNOSIS — I480 Paroxysmal atrial fibrillation: Secondary | ICD-10-CM | POA: Diagnosis not present

## 2021-04-22 NOTE — Patient Instructions (Signed)
Medication Instructions:  Your physician recommends that you continue on your current medications as directed. Please refer to the Current Medication list given to you today.  *If you need a refill on your cardiac medications before your next appointment, please call your pharmacy*   Lab Work: NONE   If you have labs (blood work) drawn today and your tests are completely normal, you will receive your results only by: MyChart Message (if you have MyChart) OR A paper copy in the mail If you have any lab test that is abnormal or we need to change your treatment, we will call you to review the results.   Testing/Procedures: NONE    Follow-Up: At [facility], you and your health needs are our priority.  As part of our continuing mission to provide you with exceptional heart care, we have created designated Provider Care Teams.  These Care Teams include your primary Cardiologist (physician) and Advanced Practice Providers (APPs -  Physician Assistants and Nurse Practitioners) who all work together to provide you with the care you need, when you need it.  We recommend signing up for the patient portal called "MyChart".  Sign up information is provided on this After Visit Summary.  MyChart is used to connect with patients for Virtual Visits (Telemedicine).  Patients are able to view lab/test results, encounter notes, upcoming appointments, etc.  Non-urgent messages can be sent to your provider as well.   To learn more about what you can do with MyChart, go to [email or web address].    Your next appointment:   6 month(s)  The format for your next appointment:   In Person  Provider:   Gregg Taylor, MD   Other Instructions Thank you for choosing [facility]!    

## 2021-04-22 NOTE — Progress Notes (Signed)
HPI [name] [name] returns for followup of PAF. He is a pleasant but obese 70 yo man with a h/o CAD, HTN, and diastolic CHF. He developed atrial fib with a RVR and I recommended he start amiodarone. His palpitations resolved. He began to feel badly and stopped the amiodarone. He has not had recurrent symptoms. He has not had any bleeding. Since he was last seen, he has had a couple of episodes of atrial fib lasting hours.  Allergies  Allergen Reactions   Shellfish Allergy Anaphylaxis and Other (See Comments)    Tongue swelling, hives    Sulfa Antibiotics Anaphylaxis and Rash    Tongue swelling, hives   Ace Inhibitors Other (See Comments) and Cough    CKD, renal failure    Escitalopram Other (See Comments)    Buzzing in ears,headache, felt like a zombie    Evolocumab Other (See Comments)    Myalgias, flu like sx    Fenofibrate Other (See Comments)    Body aches - pt currently taking isnt sure if its causing any pain    Invokana [Canagliflozin] Other (See Comments)    Syncope / dehydration   Lisinopril Cough   Metformin And Related Itching   Pravastatin Sodium Other (See Comments)    myalgias   Milk-Related Compounds     Ties stomach in knots    Crestor [Rosuvastatin] Other (See Comments)    Myalgias    Horse-Derived Products Rash   Lac Bovis Nausea Only    Ties stomach in knots     Lexapro [Escitalopram Oxalate] Other (See Comments)    Buzzing in ears,headache, felt like a zombie   Lipitor [Atorvastatin] Other (See Comments)    myalgias   Livalo [Pitavastatin] Other (See Comments)    Myalgias    Tape Rash     Current Outpatient Medications  Medication Sig Dispense Refill   albuterol (VENTOLIN HFA) 108 (90 Base) MCG/ACT inhaler Inhale 2 puffs into the lungs every 6 (six) hours as needed for wheezing or shortness of breath. 8.5 g 3   Chlorphen-Phenyleph-ASA (ALKA-SELTZER PLUS COLD PO) Take 2 tablets by mouth daily as needed (allergies).     clopidogrel (PLAVIX) 75  MG tablet Take 1 tablet by mouth once daily 90 tablet 0   dapagliflozin propanediol (FARXIGA) 10 MG TABS tablet Take 1 tablet (10 mg total) by mouth daily before breakfast. (Patient taking differently: Take 5 mg by mouth daily before breakfast.) 90 tablet 3   DULoxetine (CYMBALTA) 30 MG capsule Take 1 capsule by mouth once daily 90 capsule 0   ELIQUIS 5 MG TABS tablet Take 1 tablet by mouth twice daily 180 tablet 0   ezetimibe (ZETIA) 10 MG tablet Take 1 tablet (10 mg total) by mouth daily. 90 tablet 3   fenofibrate 160 MG tablet TAKE 1 TABLET BY MOUTH ONCE DAILY FOR CHOLESTEROL AND TRIGLYCERIDES 90 tablet 3   gabapentin (NEURONTIN) 300 MG capsule Take 300 mg by mouth 3 (three) times daily.     HYDROcodone-acetaminophen (NORCO) 5-325 MG tablet Take 1-2 tablets by mouth every 6 (six) hours as needed for moderate pain. 20 tablet 0   insulin lispro (HUMALOG KWIKPEN) 100 UNIT/ML KwikPen Inject 15-20 Units into the skin 3 (three) times daily with meals. (Patient taking differently: Inject 15 Units into the skin 3 (three) times daily with meals. Sliding scale) 15 mL 11   insulin NPH-regular Human (70-30) 100 UNIT/ML injection Inject 60 Units into the skin 2 (two) times daily with  a meal.     isosorbide mononitrate (IMDUR) 30 MG 24 hr tablet Take 1 tablet by mouth once daily 90 tablet 0   linaclotide (LINZESS) 72 MCG capsule Take 1 capsule (72 mcg total) by mouth daily as needed (constipation). 90 capsule 3   Menthol, Topical Analgesic, (BLUE-EMU MAXIMUM STRENGTH EX) Apply 1 application topically daily as needed (pain).     metoprolol succinate (TOPROL-XL) 50 MG 24 hr tablet Take 1 tablet by mouth once daily 90 tablet 3   metoprolol tartrate (LOPRESSOR) 50 MG tablet Take 50 mg by mouth daily as needed (a-fib).     MOUNJARO 2.5 MG/0.5ML Pen INJECT 2.5MG  SUBCUTANEOUSLY ONCE A WEEK 4 mL 2   nitroGLYCERIN (NITROSTAT) 0.4 MG SL tablet DISSOLVE ONE TABLET UNDER THE TONGUE EVERY 5 MINUTES AS NEEDED FOR CHEST PAIN.   DO NOT EXCEED A TOTAL OF 3 DOSES IN 15 MINUTES 25 tablet 0   pantoprazole (PROTONIX) 40 MG tablet Take 1 tablet by mouth once daily 90 tablet 0   vitamin B-12 (CYANOCOBALAMIN) 1000 MCG tablet Take 1,000 mcg by mouth daily.     furosemide (LASIX) 20 MG tablet Take Two Times Daily for 7 Days then take Daily (Patient not taking: Reported on 04/22/2021) 97 tablet 3   tirzepatide (MOUNJARO) 5 MG/0.5ML Pen Inject 5 mg into the skin once a week. (Patient not taking: Reported on 04/22/2021) 2 mL 0   tirzepatide (MOUNJARO) 7.5 MG/0.5ML Pen Inject 7.5 mg into the skin once a week. (Patient not taking: Reported on 04/22/2021) 2 mL 0   No current facility-administered medications for this visit.     Past Medical History:  Diagnosis Date   Anxiety    Arthritis    Atrial fibrillation ([facility])    CAD (coronary artery disease)    a. 2010: DES to CTO of RCA. EF 55% b. 07/2016: cath showing total occlusion within previously placed RCA stent (collaterals present), severe stenosis along LCx and OM1 (treated with 2 overlapping DES). c. repeat cath in 01/2018 showing patent stents along LCx and OM with CTO of D2, CTO of distal LCx, and CTO of RCA with collaterals present overall unchanged since 2018 with medical management recom   Cellulitis and abscess rt groin    Complication of anesthesia    " I woke up during a colonoscopy "      Depression    Diabetes mellitus    Diastolic CHF ([place])    Disorders of iron metabolism    Dysrhythmia    GERD (gastroesophageal reflux disease)    Hyperlipidemia    Hypertension    Low serum testosterone level    Medically noncompliant    Myocardial infarction ([place])    05-23-20    ROS:   All systems reviewed and negative except as noted in the HPI.   Past Surgical History:  Procedure Laterality Date   ABDOMINAL AORTOGRAM W/LOWER EXTREMITY N/A 03/21/2019   Procedure: ABDOMINAL AORTOGRAM W/LOWER EXTREMITY;  Surgeon: [name], MD;  Location: [place] [facility];   Service: Cardiovascular;  Laterality: N/A;   ABDOMINAL AORTOGRAM W/LOWER EXTREMITY N/A 11/12/2020   Procedure: ABDOMINAL AORTOGRAM W/LOWER EXTREMITY;  Surgeon: [name], MD;  Location: [place] [facility];  Service: Cardiovascular;  Laterality: N/A;   ABDOMINAL AORTOGRAM W/LOWER EXTREMITY N/A 01/28/2021   Procedure: ABDOMINAL AORTOGRAM W/LOWER EXTREMITY;  Surgeon: [name], MD;  Location: [place] [facility];  Service: Cardiovascular;  Laterality: N/A;   BACK SURGERY  2015   ACDF  by Dr. [name]   COLONOSCOPY N/A 10/01/2014   Dr. [name]: multiple tubular adenomas removed, colonic diverticulosis, redundant colon. next tcs advised for 09/2017. PATIENT NEEDS PROPOFOL FOR FAILED CONSCIOUS SEDATION   CORONARY STENT INTERVENTION N/A 07/30/2016   Procedure: Coronary Stent Intervention;  Surgeon: [name], MD;  Location: [place] [facility];  Service: Cardiovascular;  Laterality: N/A;   CORONARY STENT PLACEMENT  2000   By Dr. Olevia Perches   EP IMPLANTABLE DEVICE N/A 05/25/2016   Procedure: Loop Recorder Insertion;  Surgeon: [name], MD;  Location: [place] [facility];  Service: Cardiovascular;  Laterality: N/A;   ESOPHAGOGASTRODUODENOSCOPY     esophagus [facility] at [facility]   ESOPHAGOGASTRODUODENOSCOPY N/A 10/01/2014   Dr. [name]: patchy mottling/erythema and minimal polypoid appearance of gastric mucosa. bx with mild inlammation but no H.pylori   FEMORAL-POPLITEAL BYPASS GRAFT Left 11/22/2020   Procedure: LEFT FEMORAL-POPLITEAL BYPASS GRAFT;  Surgeon: [name], MD;  Location: [place];  Service: Vascular;  Laterality: Left;   HERNIA REPAIR  [id number]   umbilical   INSERTION OF ILIAC STENT Right 11/22/2020   Procedure: INSERTION OF ELUVIA STENT INTO RIGHT DISTAL SUPERFICIAL FEMORAL ARTERY;  Surgeon: [name], MD;  Location: [place];  Service: Vascular;  Laterality: Right;   LEFT HEART CATH AND CORONARY ANGIOGRAPHY N/A 07/30/2016   Procedure: Left Heart Cath and Coronary Angiography;   Surgeon: [name], MD;  Location: [place] [facility];  Service: Cardiovascular;  Laterality: N/A;   LEFT HEART CATH AND CORONARY ANGIOGRAPHY N/A 01/19/2018   Procedure: LEFT HEART CATH AND CORONARY ANGIOGRAPHY;  Surgeon: [name], MD;  Location: [place] [facility];  Service: Cardiovascular;  Laterality: N/A;   LEFT HEART CATH AND CORONARY ANGIOGRAPHY N/A 05/24/2020   Procedure: LEFT HEART CATH AND CORONARY ANGIOGRAPHY;  Surgeon: Burnell Blanks, MD;  Location: [place] [facility];  Service: Cardiovascular;  Laterality: N/A;   LESION REMOVAL     Lip and hand    LOWER EXTREMITY ANGIOGRAM Right 11/22/2020   Procedure: RIGHT LEG ANGIOGRAM;  Surgeon: [name], MD;  [facility];  Service: Vascular;  Laterality: Right;   LOWER EXTREMITY ANGIOGRAPHY N/A 04/18/2019   Procedure: LOWER EXTREMITY ANGIOGRAPHY;  Surgeon: [name], MD;  Location: [place] [facility];  Service: Cardiovascular;  Laterality: N/A;   NECK SURGERY     PERIPHERAL VASCULAR BALLOON ANGIOPLASTY  04/18/2019   Procedure: PERIPHERAL VASCULAR BALLOON ANGIOPLASTY;  Surgeon: [name], MD;  Location: [place] [facility];  Service: Cardiovascular;;   PERIPHERAL VASCULAR BALLOON ANGIOPLASTY Left 11/12/2020   Procedure: PERIPHERAL VASCULAR BALLOON ANGIOPLASTY;  Surgeon: [name], MD;  Location: [place] [facility];  Service: Cardiovascular;  Laterality: Left;  Failed PTA of superficial femoral artery.   PERIPHERAL VASCULAR INTERVENTION Right 03/21/2019   Procedure: PERIPHERAL VASCULAR INTERVENTION;  Surgeon: [name], MD;  Location: [place] [facility];  Service: Cardiovascular;  Laterality: Right;  superficial femoral     Family History  Problem Relation Age of Onset   Diabetes Father    Valvular heart disease Father    Arthritis Father    Heart disease Father    Stroke Father    Alzheimer's disease Mother    Hyperlipidemia Mother    Hypertension Mother    Arthritis Mother    Lung  cancer Mother    Stroke Mother    Headache Mother    Arthritis/Rheumatoid Sister    Diabetes Sister  Hypertension Sister    Hyperlipidemia Sister    Depression Sister    Dementia Maternal Aunt    Dementia Maternal Uncle    Heart disease Maternal Uncle    Stomach cancer Paternal Uncle    Colon cancer Neg Hx    Liver disease Neg Hx      Social History   Socioeconomic History   Marital status: Married    Spouse name: Belenda Cruise   Number of children: 2   Years of education: 12   Highest education level: 12th grade  Occupational History   Occupation: retired  Tobacco Use   Smoking status: Former    Packs/day: 0.25    Years: 51.00    Pack years: 12.75    Types: Cigarettes    Quit date: 08/14/2016    Years since quitting: 4.6   Smokeless tobacco: Never   Tobacco comments:    smokes  a pack a week  Vaping Use   Vaping Use: Never used  Substance and Sexual Activity   Alcohol use: No    Alcohol/week: 0.0 standard drinks   Drug use: No   Sexual activity: Yes  Other Topics Concern   Not on file  Social History Narrative   Lives with his wife.  Retired.  Unable to afford expensive medicines.     He has 2 children from previous marriage - they live in [place]       Caffeine: 1 cup of 1/2 caff coffee in AM, drinks more if at a restaurant    Social Determinants of Health   Financial Resource Strain: Medium Risk   Difficulty of Paying Living Expenses: Somewhat hard  Food Insecurity: No Food Insecurity   Worried About Charity fundraiser in the Last Year: Never true   Ran Out of Food in the Last Year: Never true  Transportation Needs: No Transportation Needs   Lack of Transportation (Medical): No   Lack of Transportation (Non-Medical): No  Physical Activity: Insufficiently Active   Days of Exercise per Week: 7 days   Minutes of Exercise per Session: 10 min  Stress: No Stress Concern Present   Feeling of Stress : Only a little  Social Connections: Moderately  Isolated   Frequency of Communication with Friends and Family: More than three times a week   Frequency of Social Gatherings with Friends and Family: Twice a week   Attends Religious Services: Never   Marine scientist or Organizations: No   Attends Music therapist: Never   Marital Status: Married  Human resources officer Violence: Not At Risk   Fear of Current or Ex-Partner: No   Emotionally Abused: No   Physically Abused: No   Sexually Abused: No     Ht 6\' 2"  (1.88 m)   Wt 296 lb 6.4 oz (134.4 kg)   BMI 38.06 kg/m   Physical Exam:  Well appearing NAD HEENT: Unremarkable Neck:  No JVD, no thyromegally Lymphatics:  No adenopathy Back:  No CVA tenderness Lungs:  Clear with no wheezes HEART:  Regular rate rhythm, no murmurs, no rubs, no clicks Abd:  soft, positive bowel sounds, no organomegally, no rebound, no guarding Ext:  2 plus pulses, no edema, no cyanosis, no clubbing Skin:  No rashes no nodules Neuro:  CN II through XII intact, motor grossly intact  EKG - nsr   Assess/Plan:  1. PAF - He has had 3 episodes of atrial fib. I suspect that he will have more atrial fib. I would consider  mutaq or dofetilide if he has more symptomatic atrial fib, likely the former.  2. Obesity - he has lost 20 lbs but has gained 5 back. He has started on mounjaro and hopefully this will help. 3. HTN - he did not take his meds this morning. He will take when he gets home.  4. CAD - he denies anginal symptoms despite his CTO of the RCA.    Carleene Overlie Michalle Rademaker,MD

## 2021-04-23 ENCOUNTER — Other Ambulatory Visit: Payer: Self-pay | Admitting: *Deleted

## 2021-04-23 DIAGNOSIS — I779 Disorder of arteries and arterioles, unspecified: Secondary | ICD-10-CM

## 2021-04-24 ENCOUNTER — Telehealth: Payer: Self-pay | Admitting: Family

## 2021-04-24 NOTE — Telephone Encounter (Signed)
Faxed received and placed on providers desk

## 2021-04-25 ENCOUNTER — Other Ambulatory Visit: Payer: Self-pay

## 2021-04-25 NOTE — Telephone Encounter (Signed)
This is a [place] pt, requesting refills be sent to a different pharmacy, [facility]

## 2021-04-28 ENCOUNTER — Other Ambulatory Visit: Payer: Self-pay

## 2021-04-28 ENCOUNTER — Other Ambulatory Visit: Payer: Self-pay | Admitting: Family

## 2021-04-28 DIAGNOSIS — E1169 Type 2 diabetes mellitus with other specified complication: Secondary | ICD-10-CM

## 2021-04-28 DIAGNOSIS — K219 Gastro-esophageal reflux disease without esophagitis: Secondary | ICD-10-CM

## 2021-04-28 DIAGNOSIS — E1142 Type 2 diabetes mellitus with diabetic polyneuropathy: Secondary | ICD-10-CM

## 2021-04-28 DIAGNOSIS — Z9861 Coronary angioplasty status: Secondary | ICD-10-CM

## 2021-04-28 DIAGNOSIS — I2510 Atherosclerotic heart disease of native coronary artery without angina pectoris: Secondary | ICD-10-CM

## 2021-04-28 DIAGNOSIS — I480 Paroxysmal atrial fibrillation: Secondary | ICD-10-CM

## 2021-04-28 DIAGNOSIS — F331 Major depressive disorder, recurrent, moderate: Secondary | ICD-10-CM

## 2021-04-28 MED ORDER — FUROSEMIDE 20 MG PO TABS: ORAL_TABLET | ORAL | 3 refills | Status: DC

## 2021-04-28 MED ORDER — CLOPIDOGREL BISULFATE 75 MG PO TABS: 75.0000 mg | ORAL_TABLET | Freq: Every day | ORAL | 0 refills | Status: DC

## 2021-04-28 MED ORDER — METOPROLOL TARTRATE 50 MG PO TABS: 50.0000 mg | ORAL_TABLET | Freq: Every day | ORAL | 6 refills | Status: DC | PRN

## 2021-04-28 MED ORDER — NITROGLYCERIN 0.4 MG SL SUBL: SUBLINGUAL_TABLET | SUBLINGUAL | 0 refills | Status: DC

## 2021-04-28 MED ORDER — METOPROLOL SUCCINATE ER 50 MG PO TB24: 50.0000 mg | ORAL_TABLET | Freq: Every day | ORAL | 3 refills | Status: DC

## 2021-04-28 MED ORDER — EZETIMIBE 10 MG PO TABS: 10.0000 mg | ORAL_TABLET | Freq: Every day | ORAL | 3 refills | Status: DC

## 2021-04-28 NOTE — Telephone Encounter (Signed)
  Prescription Request  04/28/2021  Is this a "Controlled Substance" medicine? no  Have you seen your PCP in the last 2 weeks? No 02/21/2021  If YES, route message to pool  -  If NO, patient needs to be scheduled for appointment.  What is the name of the medication or equipment?  pantoprazole (PROTONIX) 40 MG tablet isosorbide mononitrate (IMDUR) 30 MG 24 hr tablet insulin lispro (HUMALOG KWIKPEN) 100 UNIT/ML KwikPen gabapentin (NEURONTIN) 300 MG capsule fenofibrate 160 MG tablet dapagliflozin propanediol (FARXIGA) 10 MG TABS tablet ELIQUIS 5 MG TABS tablet DULoxetine (CYMBALTA) 30 MG capsule   Have you contacted your pharmacy to request a refill? [facility] behalf of [facility] [place] [facility]   Which pharmacy would you like this sent to? [facility].   Patient notified that their request is being sent to the clinical staff for review and that they should receive a response within 2 business days.

## 2021-04-29 DIAGNOSIS — M5136 Other intervertebral disc degeneration, lumbar region: Secondary | ICD-10-CM | POA: Diagnosis not present

## 2021-04-29 DIAGNOSIS — M4726 Other spondylosis with radiculopathy, lumbar region: Secondary | ICD-10-CM | POA: Diagnosis not present

## 2021-04-29 DIAGNOSIS — E119 Type 2 diabetes mellitus without complications: Secondary | ICD-10-CM | POA: Diagnosis not present

## 2021-04-29 DIAGNOSIS — Z794 Long term (current) use of insulin: Secondary | ICD-10-CM | POA: Diagnosis not present

## 2021-04-29 MED ORDER — DULOXETINE HCL 30 MG PO CPEP: 30.0000 mg | ORAL_CAPSULE | Freq: Every day | ORAL | 1 refills | Status: DC

## 2021-04-29 MED ORDER — GABAPENTIN 300 MG PO CAPS: 300.0000 mg | ORAL_CAPSULE | Freq: Three times a day (TID) | ORAL | 1 refills | Status: DC

## 2021-04-29 MED ORDER — PANTOPRAZOLE SODIUM 40 MG PO TBEC: 40.0000 mg | DELAYED_RELEASE_TABLET | Freq: Every day | ORAL | 1 refills | Status: DC

## 2021-04-29 MED ORDER — ISOSORBIDE MONONITRATE ER 30 MG PO TB24: 30.0000 mg | ORAL_TABLET | Freq: Every day | ORAL | 1 refills | Status: DC

## 2021-04-29 MED ORDER — APIXABAN 5 MG PO TABS: 5.0000 mg | ORAL_TABLET | Freq: Two times a day (BID) | ORAL | 1 refills | Status: DC

## 2021-04-29 MED ORDER — FENOFIBRATE 160 MG PO TABS: 160.0000 mg | ORAL_TABLET | Freq: Every day | ORAL | 3 refills | Status: DC

## 2021-04-29 MED ORDER — DAPAGLIFLOZIN PROPANEDIOL 5 MG PO TABS: 5.0000 mg | ORAL_TABLET | Freq: Every day | ORAL | 2 refills | Status: DC

## 2021-04-29 MED ORDER — INSULIN NPH ISOPHANE & REGULAR (70-30) 100 UNIT/ML ~~LOC~~ SUSP: 60.0000 [IU] | Freq: Two times a day (BID) | SUBCUTANEOUS | 2 refills | Status: DC

## 2021-04-30 ENCOUNTER — Telehealth: Payer: Self-pay | Admitting: Family

## 2021-04-30 DIAGNOSIS — R5381 Other malaise: Secondary | ICD-10-CM

## 2021-04-30 NOTE — Telephone Encounter (Signed)
Pt would like a referral to a physical therapist. He stated that one had been placed by [facility] before but he only went three times and they are telling him that he needs another one to come back. According to the pt the other referral was placed in July of this year. Aware that he may need to have another appt. Please call back and advise.

## 2021-04-30 NOTE — Telephone Encounter (Signed)
Referral to PT ordered.   [name], FNP

## 2021-05-06 ENCOUNTER — Telehealth: Payer: Self-pay | Admitting: Family

## 2021-05-06 NOTE — Telephone Encounter (Signed)
°  Prescription Request  05/06/2021  Is this a "Controlled Substance" medicine? NO  Have you seen your PCP in the last 2 weeks? NO  If YES, route message to pool  -  If NO, patient needs to be scheduled for appointment.  What is the name of the medication or equipment? Needs new RX for Humulin for larger quantity. Like a 30 day supply. Got a 8 day supply  Have you contacted your pharmacy to request a refill? YES   Which pharmacy would you like this sent to? [facility]   Patient notified that their request is being sent to the clinical staff for review and that they should receive a response within 2 business days.

## 2021-05-07 MED ORDER — INSULIN LISPRO (1 UNIT DIAL) 100 UNIT/ML (KWIKPEN): 15.0000 [IU] | PEN_INJECTOR | Freq: Three times a day (TID) | SUBCUTANEOUS | 2 refills | Status: DC

## 2021-05-07 MED ORDER — INSULIN SYRINGE-NEEDLE U-100 28G X 1/2" 1 ML MISC
3 refills | Status: DC
Start: 2021-05-07 — End: 2022-01-22

## 2021-05-07 NOTE — Telephone Encounter (Signed)
Verdis Frederickson called from [facility] called to get update on the status of this refill request because she says pt needs it ASAP. Also says that they reached out to [place] on 12/16 requesting 6 other medication refills and have not received a response on any of their either.   Requesting that all Rx's be sent in to them today.

## 2021-05-07 NOTE — Telephone Encounter (Signed)
Humalog was sent to pharmacy, list of meds that were sent on 04/29/21 was gone through with pharmacy, they did receive all refills. Pt needed insulin syringes and these were sent today.

## 2021-05-14 ENCOUNTER — Other Ambulatory Visit: Payer: Self-pay

## 2021-05-14 ENCOUNTER — Other Ambulatory Visit: Payer: Self-pay | Admitting: Family

## 2021-05-14 ENCOUNTER — Ambulatory Visit: Payer: Medicare Other | Attending: Family

## 2021-05-14 DIAGNOSIS — R262 Difficulty in walking, not elsewhere classified: Secondary | ICD-10-CM | POA: Diagnosis not present

## 2021-05-14 DIAGNOSIS — R5381 Other malaise: Secondary | ICD-10-CM | POA: Insufficient documentation

## 2021-05-14 DIAGNOSIS — Z9181 History of falling: Secondary | ICD-10-CM | POA: Insufficient documentation

## 2021-05-14 DIAGNOSIS — M6281 Muscle weakness (generalized): Secondary | ICD-10-CM | POA: Insufficient documentation

## 2021-05-14 NOTE — Therapy (Signed)
[facility] [place], [name], [address] Phone: [phone]   Fax:  [phone]  Physical Therapy Evaluation  Patient Details  Name: [name] [name] MRN: [id number] Date of Birth: Dec 10, 1950 Referring Provider (PT): [name], FNP   Encounter Date: 05/14/2021   PT End of Session - 05/14/21 1123     Visit Number 1    Number of Visits 12    Date for PT Re-Evaluation 08/01/21    PT Start Time 4034    PT Stop Time 1205    PT Time Calculation (min) 41 min    Activity Tolerance Patient tolerated treatment well    Behavior During Therapy [facility] for tasks assessed/performed             Past Medical History:  Diagnosis Date   Anxiety    Arthritis    Atrial fibrillation ([place])    CAD (coronary artery disease)    a. 2010: DES to CTO of RCA. EF 55% b. 07/2016: cath showing total occlusion within previously placed RCA stent (collaterals present), severe stenosis along LCx and OM1 (treated with 2 overlapping DES). c. repeat cath in 01/2018 showing patent stents along LCx and OM with CTO of D2, CTO of distal LCx, and CTO of RCA with collaterals present overall unchanged since 2018 with medical management recom   Cellulitis and abscess rt groin    Complication of anesthesia    " I woke up during a colonoscopy "      Depression    Diabetes mellitus    Diastolic CHF ([place])    Disorders of iron metabolism    Dysrhythmia    GERD (gastroesophageal reflux disease)    Hyperlipidemia    Hypertension    Low serum testosterone level    Medically noncompliant    Myocardial infarction [facility])    05-23-20    Past Surgical History:  Procedure Laterality Date   ABDOMINAL AORTOGRAM W/LOWER EXTREMITY N/A 03/21/2019   Procedure: ABDOMINAL AORTOGRAM W/LOWER EXTREMITY;  Surgeon: [name], MD;  Location: [place] [facility];  Service: Cardiovascular;  Laterality: N/A;   ABDOMINAL AORTOGRAM W/LOWER EXTREMITY N/A 11/12/2020   Procedure:  ABDOMINAL AORTOGRAM W/LOWER EXTREMITY;  Surgeon: [name], MD;  Location: [place] [facility];  Service: Cardiovascular;  Laterality: N/A;   ABDOMINAL AORTOGRAM W/LOWER EXTREMITY N/A 01/28/2021   Procedure: ABDOMINAL AORTOGRAM W/LOWER EXTREMITY;  Surgeon: [name], MD;  Location: [place] [facility];  Service: Cardiovascular;  Laterality: N/A;   BACK SURGERY  2015   ACDF by Dr. [name]   COLONOSCOPY N/A 10/01/2014   Dr. [name]: multiple tubular adenomas removed, colonic diverticulosis, redundant colon. next tcs advised for 09/2017. PATIENT NEEDS PROPOFOL FOR FAILED CONSCIOUS SEDATION   CORONARY STENT INTERVENTION N/A 07/30/2016   Procedure: Coronary Stent Intervention;  Surgeon: [name], MD;  Location: [place] [facility];  Service: Cardiovascular;  Laterality: N/A;   CORONARY STENT PLACEMENT  2000   By Dr. Olevia Perches   EP IMPLANTABLE DEVICE N/A 05/25/2016   Procedure: Loop Recorder Insertion;  Surgeon: [name], MD;  Location: [place] [facility];  Service: Cardiovascular;  Laterality: N/A;   ESOPHAGOGASTRODUODENOSCOPY     esophagus [facility] at [facility]   ESOPHAGOGASTRODUODENOSCOPY N/A 10/01/2014   Dr. [name]: patchy mottling/erythema and minimal polypoid appearance of gastric mucosa. bx with mild inlammation but no H.pylori   FEMORAL-POPLITEAL BYPASS GRAFT Left 11/22/2020   Procedure: LEFT FEMORAL-POPLITEAL BYPASS GRAFT;  Surgeon: [name], MD;  [facility];  Service: Vascular;  Laterality: Left;   HERNIA REPAIR  [id number]   umbilical   INSERTION OF ILIAC STENT Right 11/22/2020   Procedure: INSERTION OF ELUVIA STENT INTO RIGHT DISTAL SUPERFICIAL FEMORAL ARTERY;  Surgeon: [name], MD;  Location: [place];  Service: Vascular;  Laterality: Right;   LEFT HEART CATH AND CORONARY ANGIOGRAPHY N/A 07/30/2016   Procedure: Left Heart Cath and Coronary Angiography;  Surgeon: [name], MD;  Location: [place] [facility];  Service: Cardiovascular;  Laterality: N/A;   LEFT  HEART CATH AND CORONARY ANGIOGRAPHY N/A 01/19/2018   Procedure: LEFT HEART CATH AND CORONARY ANGIOGRAPHY;  Surgeon: [name], MD;  Location: [place] [facility];  Service: Cardiovascular;  Laterality: N/A;   LEFT HEART CATH AND CORONARY ANGIOGRAPHY N/A 05/24/2020   Procedure: LEFT HEART CATH AND CORONARY ANGIOGRAPHY;  Surgeon: Burnell Blanks, MD;  Location: [place] [facility];  Service: Cardiovascular;  Laterality: N/A;   LESION REMOVAL     Lip and hand    LOWER EXTREMITY ANGIOGRAM Right 11/22/2020   Procedure: RIGHT LEG ANGIOGRAM;  Surgeon: [name], MD;  [facility];  Service: Vascular;  Laterality: Right;   LOWER EXTREMITY ANGIOGRAPHY N/A 04/18/2019   Procedure: LOWER EXTREMITY ANGIOGRAPHY;  Surgeon: [name], MD;  Location: [place] [facility];  Service: Cardiovascular;  Laterality: N/A;   NECK SURGERY     PERIPHERAL VASCULAR BALLOON ANGIOPLASTY  04/18/2019   Procedure: PERIPHERAL VASCULAR BALLOON ANGIOPLASTY;  Surgeon: [name], MD;  Location: [place] [facility];  Service: Cardiovascular;;   PERIPHERAL VASCULAR BALLOON ANGIOPLASTY Left 11/12/2020   Procedure: PERIPHERAL VASCULAR BALLOON ANGIOPLASTY;  Surgeon: [name], MD;  Location: [place] [facility];  Service: Cardiovascular;  Laterality: Left;  Failed PTA of superficial femoral artery.   PERIPHERAL VASCULAR INTERVENTION Right 03/21/2019   Procedure: PERIPHERAL VASCULAR INTERVENTION;  Surgeon: [name], MD;  Location: [place] [facility];  Service: Cardiovascular;  Laterality: Right;  superficial femoral    There were no vitals filed for this visit.    Subjective Assessment - 05/14/21 1123     Subjective Patient reports that he has been having good days and bad days. He noticed that the cold air has been causing his legs to start throbbing. He has recieved an injection in low back which helped some. He also had a nerve in his back "burnt in two" which has gotten rid of his pain. He has  noticed that his left leg will swell very bad. He has talked to his physican about this and they told him to wait another year before they could do anything. He notes that since he had COVID-19 around Thanksgiving this year that he gets short of breath and tired a lot quicker than normal. He has tried to ride a stationary bike at home, but he felt like his quadriceps was about to fall out. He notes that he has significant difficulty walking and geting up around his house as he has fallen multiple times.    Pertinent History history of COVID-19, "bad knees", chronic back and leg pain, neuropathy, heart attack earlier this year, HTN, diabetes, CHF, a-fib, DDD, diabetic neuropathy    Limitations Standing;Walking    How long can you stand comfortably? Unable to stand unsupported without falling    How long can you walk comfortably? maybe 200 yards    Patient Stated Goals be able to go fishing (bank and boat), be  able to mow his yard, be able to get up and down from the floor    Currently in Pain? Yes    Pain Location Back    Pain Orientation Lower    Pain Type Chronic pain    Pain Radiating Towards into left leg    Pain Onset More than a month ago    Pain Frequency Constant    Effect of Pain on Daily Activities requires assistance with daily activities                [name] PT Assessment - 05/14/21 0001       Assessment   Medical Diagnosis Physical deconditioning.    Referring Provider (PT) [name], FNP    Onset Date/Surgical Date 11/22/20    Next MD Visit 05/27/21    Prior Therapy Yes      Precautions   Precautions Fall      Restrictions   Weight Bearing Restrictions No      Balance Screen   Has the patient fallen in the past 6 months Yes    How many times? 6+    Has the patient had a decrease in activity level because of a fear of falling?  Yes    Is the patient reluctant to leave their home because of a fear of falling?  Yes      [place] residence    Type of [place] to enter    Entrance Stairs-Number of Steps 4    Entrance Stairs-Rails Can reach both    [place] One level    [place] bars - toilet;Grab bars - tub/shower;Shower seat;Walker - Psychologist, educational - 4 wheels   He notes that he will fall if he stands unsupported     Prior Function   Level of Independence Independent    Leisure yardwork, fixing things around his house      Cognition   Overall Cognitive Status Within Functional Limits for tasks assessed    Attention Focused    Focused Attention Appears intact    Memory Appears intact    Awareness Appears intact    Problem Solving Appears intact      AROM   Overall AROM  Within functional limits for tasks performed      Strength   Strength Assessment Site Hip;Knee    Right/Left Hip Right;Left    Right Hip Flexion 4/5    Left Hip Flexion 4-/5    Right/Left Knee Right;Left    Right Knee Flexion 4+/5    Right Knee Extension 4+/5    Left Knee Flexion 4+/5   pain along hamstring   Left Knee Extension 4+/5   pain around knee     Transfers   Transfers Sit to Stand;Stand to Sit    Sit to Stand 5: Supervision;With upper extremity assist    Five time sit to stand comments  50 seconds    Stand to Sit Uncontrolled descent;With upper extremity assist;5: Supervision      Ambulation/Gait   Gait Comments He was recommended to utilize his assistive devices to improve his safety with walking                        Objective measurements completed on examination: See above findings.                     PT Long Term  Goals - 05/14/21 1231       PT LONG TERM GOAL #1   Title Patient will be independent with his HEP.    Time 6    Period Weeks    Status New    Target Date 06/25/21      PT LONG TERM GOAL #2   Title Patient will be able to stand unsupported for at least 1 minute for improved ability to shave.    Time 6    Period Weeks     Status New    Target Date 06/25/21      PT LONG TERM GOAL #3   Title Patient will be able to navigate at least 4 steps with a reciprical pattern with upper extremity support for improved function getting into and out of his house.    Time 6    Period Weeks    Status New    Target Date 06/25/21      PT LONG TERM GOAL #4   Title Patient will improve his five time sit to stand time to 40 seconds or less for improved ability to transfer.    Time 6    Period Weeks    Status New    Target Date 06/25/21                    Plan - 05/14/21 1217     Clinical Impression Statement Patient is a 70 year old male presenting to physical therapy with physical deconditioning secondary to multiple medical conditions. He is a high fall risk due to his multiple falls in recent months and his elevated five time sit to stand time. He also required the use of his upper extremities to transfer as he was unable to complete this transfer without assistance. Further objective assessments were limited due to his significant subjective history. Recommend that his continue with physical therapy to address his impairments to maximize his functional mobility.    Personal Factors and Comorbidities Fitness;Comorbidity 1;Comorbidity 2;Comorbidity 3+;Time since onset of injury/illness/exacerbation    Comorbidities HTN, diabetes, CHF, a-fib, DDD, diabetic neuropathy, OA, latex and adhesive allergies,    Examination-Activity Limitations Bathing;Locomotion Level;Transfers;Bed Mobility;Reach Overhead;Bend;Carry;Squat;Stairs;Stand;Lift    Examination-Participation Restrictions Community Activity;Other;Yard Work    Merchant navy officer Unstable/Unpredictable    Surveyor, mining    Rehab Potential Fair    PT Frequency 3x / week   2-3x/ week   PT Duration 6 weeks    PT Treatment/Interventions ADLs/Self Care Home Management;Cryotherapy;Electrical Stimulation;Ultrasound;Gait training;Stair  training;Functional mobility training;Therapeutic activities;Therapeutic exercise;Balance training;Neuromuscular re-education;Manual techniques;Patient/family education;Energy conservation    PT Next Visit Plan nustep, light lower extremity strengthening and balance interventions    Consulted and Agree with Plan of Care Patient             Patient will benefit from skilled therapeutic intervention in order to improve the following deficits and impairments:  Difficulty walking, Obesity, Decreased endurance, Decreased activity tolerance, Pain, Decreased balance, Decreased strength, Decreased mobility  Visit Diagnosis: Difficulty in walking, not elsewhere classified  History of falling  Muscle weakness (generalized)     Problem List Patient Active Problem List   Diagnosis Date Noted   S/P femoral-popliteal bypass surgery 11/22/2020   PAOD (peripheral arterial occlusive disease) ([place]) 11/22/2020   Non-ST elevation (NSTEMI) myocardial infarction ([place]) 05/23/2020   Orthostatic hypotension 01/01/2020   Statin myopathy 12/06/2019   Diarrhea 05/10/2018   PAF (paroxysmal atrial fibrillation) ([place]) 03/29/2018   Lacunar infarction ([place]) 03/29/2018   Unstable  angina ([place])    Chest pain 01/18/2018   CHF (congestive heart failure) ([place]) 10/21/2017   Anxiety 03/09/2017   Constipation 11/26/2016   Diabetes ([place]) 05/25/2016   Occlusion and stenosis of vertebral artery    Acute renal failure ([place]) 05/23/2016   Diabetic neuropathy ([place]) 01/13/2016   Depression 11/01/2015   Erectile dysfunction 01/23/2015   Morbid obesity ([place]) 01/23/2015   Periodontal disease 01/23/2015   Stopped smoking with greater than 30 pack year history 01/23/2015   Diabetic peripheral neuropathy associated with type 2 diabetes mellitus ([place]) 11/08/2014   Mucosal abnormality of stomach    History of colonic polyps    Abdominal pain 09/06/2014   GERD (gastroesophageal reflux disease) 09/06/2014   Low serum  testosterone level 08/01/2014   DDD (degenerative disc disease), cervical 12/20/2013   Headache 08/07/2013   Medically noncompliant 10/25/2011   CAD S/P percutaneous coronary angioplasty 10/25/2011   MURMUR 04/16/2009   Hypertension associated with diabetes ([place]) 08/02/2008   Hyperlipidemia associated with type 2 diabetes mellitus ([place]) 07/31/2008   DISORDERS OF IRON METABOLISM 07/31/2008    Darlin Coco, PT 05/14/2021, 12:40 PM  [facility] [place], [name], [address] Phone: [phone]   Fax:  [phone]  Name: [name] [name] MRN: [id number] Date of Birth: 11-Nov-1950

## 2021-05-19 ENCOUNTER — Other Ambulatory Visit: Payer: Self-pay | Admitting: Family

## 2021-05-21 ENCOUNTER — Ambulatory Visit: Payer: Medicare Other | Attending: Family | Admitting: Physical Therapy

## 2021-05-21 ENCOUNTER — Other Ambulatory Visit: Payer: Self-pay

## 2021-05-21 ENCOUNTER — Encounter: Payer: Self-pay | Admitting: Physical Therapy

## 2021-05-21 DIAGNOSIS — Z9181 History of falling: Secondary | ICD-10-CM | POA: Diagnosis not present

## 2021-05-21 DIAGNOSIS — R262 Difficulty in walking, not elsewhere classified: Secondary | ICD-10-CM | POA: Diagnosis not present

## 2021-05-21 DIAGNOSIS — R2681 Unsteadiness on feet: Secondary | ICD-10-CM | POA: Insufficient documentation

## 2021-05-21 DIAGNOSIS — M6281 Muscle weakness (generalized): Secondary | ICD-10-CM | POA: Insufficient documentation

## 2021-05-21 NOTE — Therapy (Signed)
[facility] [place], [name], [address] Phone: [phone]   Fax:  [phone]  Physical Therapy Treatment  Patient Details  Name: RANNY WIEBELHAUS MRN: [id number] Date of Birth: 1950/10/02 Referring Provider (PT): [name], FNP   Encounter Date: 05/21/2021   PT End of Session - 05/21/21 1231     Visit Number 2    Number of Visits 12    Date for PT Re-Evaluation 08/01/21    PT Start Time 1121    PT Stop Time 1153    PT Time Calculation (min) 32 min    Activity Tolerance Patient tolerated treatment well;Patient limited by fatigue    Behavior During Therapy [facility] for tasks assessed/performed             Past Medical History:  Diagnosis Date   Anxiety    Arthritis    Atrial fibrillation ([place])    CAD (coronary artery disease)    a. 2010: DES to CTO of RCA. EF 55% b. 07/2016: cath showing total occlusion within previously placed RCA stent (collaterals present), severe stenosis along LCx and OM1 (treated with 2 overlapping DES). c. repeat cath in 01/2018 showing patent stents along LCx and OM with CTO of D2, CTO of distal LCx, and CTO of RCA with collaterals present overall unchanged since 2018 with medical management recom   Cellulitis and abscess rt groin    Complication of anesthesia    " I woke up during a colonoscopy "      Depression    Diabetes mellitus    Diastolic CHF ([place])    Disorders of iron metabolism    Dysrhythmia    GERD (gastroesophageal reflux disease)    Hyperlipidemia    Hypertension    Low serum testosterone level    Medically noncompliant    Myocardial infarction [facility])    05-23-20    Past Surgical History:  Procedure Laterality Date   ABDOMINAL AORTOGRAM W/LOWER EXTREMITY N/A 03/21/2019   Procedure: ABDOMINAL AORTOGRAM W/LOWER EXTREMITY;  Surgeon: [name], MD;  Location: [place] [facility];  Service: Cardiovascular;  Laterality: N/A;   ABDOMINAL AORTOGRAM W/LOWER EXTREMITY N/A  11/12/2020   Procedure: ABDOMINAL AORTOGRAM W/LOWER EXTREMITY;  Surgeon: [name], MD;  Location: [place] [facility];  Service: Cardiovascular;  Laterality: N/A;   ABDOMINAL AORTOGRAM W/LOWER EXTREMITY N/A 01/28/2021   Procedure: ABDOMINAL AORTOGRAM W/LOWER EXTREMITY;  Surgeon: [name], MD;  Location: [place] [facility];  Service: Cardiovascular;  Laterality: N/A;   BACK SURGERY  2015   ACDF by Dr. [name]   COLONOSCOPY N/A 10/01/2014   Dr. [name]: multiple tubular adenomas removed, colonic diverticulosis, redundant colon. next tcs advised for 09/2017. PATIENT NEEDS PROPOFOL FOR FAILED CONSCIOUS SEDATION   CORONARY STENT INTERVENTION N/A 07/30/2016   Procedure: Coronary Stent Intervention;  Surgeon: [name], MD;  Location: [place] [facility];  Service: Cardiovascular;  Laterality: N/A;   CORONARY STENT PLACEMENT  2000   By Dr. Olevia Perches   EP IMPLANTABLE DEVICE N/A 05/25/2016   Procedure: Loop Recorder Insertion;  Surgeon: [name], MD;  Location: [place] [facility];  Service: Cardiovascular;  Laterality: N/A;   ESOPHAGOGASTRODUODENOSCOPY     esophagus stretched remotely at [facility]   ESOPHAGOGASTRODUODENOSCOPY N/A 10/01/2014   Dr. [name]: patchy mottling/erythema and minimal polypoid appearance of gastric mucosa. bx with mild inlammation but no H.pylori   FEMORAL-POPLITEAL BYPASS GRAFT Left 11/22/2020   Procedure: LEFT FEMORAL-POPLITEAL BYPASS GRAFT;  Surgeon: [name],  Butch Penny, MD;  Location: [place];  Service: Vascular;  Laterality: Left;   HERNIA REPAIR  [id number]   umbilical   INSERTION OF ILIAC STENT Right 11/22/2020   Procedure: INSERTION OF ELUVIA STENT INTO RIGHT DISTAL SUPERFICIAL FEMORAL ARTERY;  Surgeon: [name], MD;  Location: [place];  Service: Vascular;  Laterality: Right;   LEFT HEART CATH AND CORONARY ANGIOGRAPHY N/A 07/30/2016   Procedure: Left Heart Cath and Coronary Angiography;  Surgeon: [name], MD;  Location: [place] [facility];  Service: Cardiovascular;   Laterality: N/A;   LEFT HEART CATH AND CORONARY ANGIOGRAPHY N/A 01/19/2018   Procedure: LEFT HEART CATH AND CORONARY ANGIOGRAPHY;  Surgeon: [name], MD;  Location: [place] [facility];  Service: Cardiovascular;  Laterality: N/A;   LEFT HEART CATH AND CORONARY ANGIOGRAPHY N/A 05/24/2020   Procedure: LEFT HEART CATH AND CORONARY ANGIOGRAPHY;  Surgeon: Burnell Blanks, MD;  Location: [place] [facility];  Service: Cardiovascular;  Laterality: N/A;   LESION REMOVAL     Lip and hand    LOWER EXTREMITY ANGIOGRAM Right 11/22/2020   Procedure: RIGHT LEG ANGIOGRAM;  Surgeon: [name], MD;  [facility];  Service: Vascular;  Laterality: Right;   LOWER EXTREMITY ANGIOGRAPHY N/A 04/18/2019   Procedure: LOWER EXTREMITY ANGIOGRAPHY;  Surgeon: [name], MD;  Location: [place] [facility];  Service: Cardiovascular;  Laterality: N/A;   NECK SURGERY     PERIPHERAL VASCULAR BALLOON ANGIOPLASTY  04/18/2019   Procedure: PERIPHERAL VASCULAR BALLOON ANGIOPLASTY;  Surgeon: [name], MD;  Location: [place] [facility];  Service: Cardiovascular;;   PERIPHERAL VASCULAR BALLOON ANGIOPLASTY Left 11/12/2020   Procedure: PERIPHERAL VASCULAR BALLOON ANGIOPLASTY;  Surgeon: [name], MD;  Location: [place] [facility];  Service: Cardiovascular;  Laterality: Left;  Failed PTA of superficial femoral artery.   PERIPHERAL VASCULAR INTERVENTION Right 03/21/2019   Procedure: PERIPHERAL VASCULAR INTERVENTION;  Surgeon: [name], MD;  Location: [place] [facility];  Service: Cardiovascular;  Laterality: Right;  superficial femoral    There were no vitals filed for this visit.   Subjective Assessment - 05/21/21 1133     Subjective Wife reports that he stumbles some. States that he can get up and down stairs fairly better.    Pertinent History history of COVID-19, "bad knees", chronic back and leg pain, neuropathy, heart attack earlier this year, HTN, diabetes, CHF, a-fib, DDD, diabetic  neuropathy    Limitations Standing;Walking    How long can you stand comfortably? Unable to stand unsupported without falling    How long can you walk comfortably? maybe 200 yards    Patient Stated Goals be able to go fishing (bank and boat), be able to mow his yard, be able to get up and down from the floor    Currently in Pain? No/denies                [facility] PT Assessment - 05/21/21 0001       Assessment   Medical Diagnosis Physical deconditioning.    Referring Provider (PT) [name], FNP    Onset Date/Surgical Date 11/22/20    Next MD Visit 05/27/21    Prior Therapy Yes      Precautions   Precautions Fall      Restrictions   Weight Bearing Restrictions No  [facility] Adult PT Treatment/Exercise - 05/21/21 0001       Exercises   Exercises Knee/Hip      Knee/Hip Exercises: Aerobic   Nustep L1, seat 12 x15 min      Knee/Hip Exercises: Standing   Heel Raises Both;10 reps   stopped after few reps as patient is sore after cramping last night in calves   Hip Abduction AROM;Both;20 reps;Knee straight      Knee/Hip Exercises: Seated   Long Arc Quad AROM;Both;3 sets;10 reps    Clamshell with TheraBand Red   x20 reps   Hamstring Curl Strengthening;Both;2 sets;10 reps;Limitations    Hamstring Limitations red theraband                          PT Long Term Goals - 05/14/21 1231       PT LONG TERM GOAL #1   Title Patient will be independent with his HEP.    Time 6    Period Weeks    Status New    Target Date 06/25/21      PT LONG TERM GOAL #2   Title Patient will be able to stand unsupported for at least 1 minute for improved ability to shave.    Time 6    Period Weeks    Status New    Target Date 06/25/21      PT LONG TERM GOAL #3   Title Patient will be able to navigate at least 4 steps with a reciprical pattern with upper extremity support for improved function getting into and out of his house.     Time 6    Period Weeks    Status New    Target Date 06/25/21      PT LONG TERM GOAL #4   Title Patient will improve his five time sit to stand time to 40 seconds or less for improved ability to transfer.    Time 6    Period Weeks    Status New    Target Date 06/25/21                   Plan - 05/21/21 1307     Clinical Impression Statement Patient presented in clinic with reports of some stumbling but pleased with how he does ADL. Patient guided through therex but due to phyiscal deconditioning levels light therex completed only. Patinet reported LE soreness after cramping last night thus limiting standing exercises. Light reps completed with seated exercises as well for long duration muscle training.    Personal Factors and Comorbidities Fitness;Comorbidity 1;Comorbidity 2;Comorbidity 3+;Time since onset of injury/illness/exacerbation    Comorbidities HTN, diabetes, CHF, a-fib, DDD, diabetic neuropathy, OA, latex and adhesive allergies,    Examination-Activity Limitations Bathing;Locomotion Level;Transfers;Bed Mobility;Reach Overhead;Bend;Carry;Squat;Stairs;Stand;Lift    Examination-Participation Restrictions Community Activity;Other;Yard Work    Stability/Clinical Decision Making Unstable/Unpredictable    Rehab Potential Fair    PT Frequency 3x / week    PT Duration 6 weeks    PT Treatment/Interventions ADLs/Self Care Home Management;Cryotherapy;Electrical Stimulation;Ultrasound;Gait training;Stair training;Functional mobility training;Therapeutic activities;Therapeutic exercise;Balance training;Neuromuscular re-education;Manual techniques;Patient/family education;Energy conservation    PT Next Visit Plan nustep, light lower extremity strengthening and balance interventions    Consulted and Agree with Plan of Care Patient             Patient will benefit from skilled therapeutic intervention in order to improve the following deficits and impairments:  Difficulty walking,  Obesity, Decreased endurance, Decreased activity tolerance, Pain,  Decreased balance, Decreased strength, Decreased mobility  Visit Diagnosis: Difficulty in walking, not elsewhere classified  History of falling  Muscle weakness (generalized)  Unsteadiness on feet     Problem List Patient Active Problem List   Diagnosis Date Noted   S/P femoral-popliteal bypass surgery 11/22/2020   PAOD (peripheral arterial occlusive disease) ([place]) 11/22/2020   Non-ST elevation (NSTEMI) myocardial infarction ([place]) 05/23/2020   Orthostatic hypotension 01/01/2020   Statin myopathy 12/06/2019   Diarrhea 05/10/2018   PAF (paroxysmal atrial fibrillation) ([place]) 03/29/2018   Lacunar infarction ([place]) 03/29/2018   Unstable angina ([facility])    Chest pain 01/18/2018   CHF (congestive heart failure) ([place]) 10/21/2017   Anxiety 03/09/2017   Constipation 11/26/2016   Diabetes ([place]) 05/25/2016   Occlusion and stenosis of vertebral artery    Acute renal failure ([place]) 05/23/2016   Diabetic neuropathy ([place]) 01/13/2016   Depression 11/01/2015   Erectile dysfunction 01/23/2015   Morbid obesity ([place]) 01/23/2015   Periodontal disease 01/23/2015   Stopped smoking with greater than 30 pack year history 01/23/2015   Diabetic peripheral neuropathy associated with type 2 diabetes mellitus ([place]) 11/08/2014   Mucosal abnormality of stomach    History of colonic polyps    Abdominal pain 09/06/2014   GERD (gastroesophageal reflux disease) 09/06/2014   Low serum testosterone level 08/01/2014   DDD (degenerative disc disease), cervical 12/20/2013   Headache 08/07/2013   Medically noncompliant 10/25/2011   CAD S/P percutaneous coronary angioplasty 10/25/2011   MURMUR 04/16/2009   Hypertension associated with diabetes ([place]) 08/02/2008   Hyperlipidemia associated with type 2 diabetes mellitus ([place]) 07/31/2008   DISORDERS OF IRON METABOLISM 07/31/2008    Standley Brooking, PTA 05/21/2021, 1:39 PM  [facility] [place], [name], [address] Phone: [phone]   Fax:  [phone]  Name: KHAYRI KARGBO MRN: [id number] Date of Birth: [birth year]

## 2021-05-26 ENCOUNTER — Other Ambulatory Visit: Payer: Self-pay

## 2021-05-26 ENCOUNTER — Ambulatory Visit: Payer: Medicare Other

## 2021-05-26 DIAGNOSIS — M6281 Muscle weakness (generalized): Secondary | ICD-10-CM

## 2021-05-26 DIAGNOSIS — Z9181 History of falling: Secondary | ICD-10-CM

## 2021-05-26 DIAGNOSIS — R262 Difficulty in walking, not elsewhere classified: Secondary | ICD-10-CM

## 2021-05-26 DIAGNOSIS — R2681 Unsteadiness on feet: Secondary | ICD-10-CM | POA: Diagnosis not present

## 2021-05-26 NOTE — Therapy (Signed)
[facility] [place], [name], [address] Phone: [phone]   Fax:  [phone]  Physical Therapy Treatment  Patient Details  Name: [name] [name] MRN: [id number] Date of Birth: [birth year] Referring Provider (PT): [name], FNP   Encounter Date: 05/26/2021   PT End of Session - 05/26/21 1134     Visit Number 3    Number of Visits 12    Date for PT Re-Evaluation 08/01/21    PT Start Time 1115    PT Stop Time 1200    PT Time Calculation (min) 45 min    Activity Tolerance Patient tolerated treatment well;Patient limited by fatigue    Behavior During Therapy [facility] for tasks assessed/performed             Past Medical History:  Diagnosis Date   Anxiety    Arthritis    Atrial fibrillation ([place])    CAD (coronary artery disease)    a. 2010: DES to CTO of RCA. EF 55% b. 07/2016: cath showing total occlusion within previously placed RCA stent (collaterals present), severe stenosis along LCx and OM1 (treated with 2 overlapping DES). c. repeat cath in 01/2018 showing patent stents along LCx and OM with CTO of D2, CTO of distal LCx, and CTO of RCA with collaterals present overall unchanged since 2018 with medical management recom   Cellulitis and abscess rt groin    Complication of anesthesia    " I woke up during a colonoscopy "      Depression    Diabetes mellitus    Diastolic CHF ([place])    Disorders of iron metabolism    Dysrhythmia    GERD (gastroesophageal reflux disease)    Hyperlipidemia    Hypertension    Low serum testosterone level    Medically noncompliant    Myocardial infarction [facility])    05-23-20    Past Surgical History:  Procedure Laterality Date   ABDOMINAL AORTOGRAM W/LOWER EXTREMITY N/A 03/21/2019   Procedure: ABDOMINAL AORTOGRAM W/LOWER EXTREMITY;  Surgeon: [name], MD;  Location: [place] [facility];  Service: Cardiovascular;  Laterality: N/A;   ABDOMINAL AORTOGRAM W/LOWER EXTREMITY N/A  11/12/2020   Procedure: ABDOMINAL AORTOGRAM W/LOWER EXTREMITY;  Surgeon: [name], MD;  Location: [place] [facility];  Service: Cardiovascular;  Laterality: N/A;   ABDOMINAL AORTOGRAM W/LOWER EXTREMITY N/A 01/28/2021   Procedure: ABDOMINAL AORTOGRAM W/LOWER EXTREMITY;  Surgeon: [name], MD;  Location: [place] [facility];  Service: Cardiovascular;  Laterality: N/A;   BACK SURGERY  2015   ACDF by Dr. [name]   COLONOSCOPY N/A 10/01/2014   Dr. [name]: multiple tubular adenomas removed, colonic diverticulosis, redundant colon. next tcs advised for 09/2017. PATIENT NEEDS PROPOFOL FOR FAILED CONSCIOUS SEDATION   CORONARY STENT INTERVENTION N/A 07/30/2016   Procedure: Coronary Stent Intervention;  Surgeon: [name], MD;  Location: [place] [facility];  Service: Cardiovascular;  Laterality: N/A;   CORONARY STENT PLACEMENT  2000   By Dr. Olevia Perches   EP IMPLANTABLE DEVICE N/A 05/25/2016   Procedure: Loop Recorder Insertion;  Surgeon: [name], MD;  Location: [place] [facility];  Service: Cardiovascular;  Laterality: N/A;   ESOPHAGOGASTRODUODENOSCOPY     esophagus stretched remotely at [facility]   ESOPHAGOGASTRODUODENOSCOPY N/A 10/01/2014   Dr. [name]: patchy mottling/erythema and minimal polypoid appearance of gastric mucosa. bx with mild inlammation but no H.pylori   FEMORAL-POPLITEAL BYPASS GRAFT Left 11/22/2020   Procedure: LEFT FEMORAL-POPLITEAL BYPASS GRAFT;  Surgeon: [name],  Butch Penny, MD;  Location: [place];  Service: Vascular;  Laterality: Left;   HERNIA REPAIR  [id number]   umbilical   INSERTION OF ILIAC STENT Right 11/22/2020   Procedure: INSERTION OF ELUVIA STENT INTO RIGHT DISTAL SUPERFICIAL FEMORAL ARTERY;  Surgeon: [name], MD;  Location: [place];  Service: Vascular;  Laterality: Right;   LEFT HEART CATH AND CORONARY ANGIOGRAPHY N/A 07/30/2016   Procedure: Left Heart Cath and Coronary Angiography;  Surgeon: [name], MD;  Location: [place] [facility];  Service: Cardiovascular;   Laterality: N/A;   LEFT HEART CATH AND CORONARY ANGIOGRAPHY N/A 01/19/2018   Procedure: LEFT HEART CATH AND CORONARY ANGIOGRAPHY;  Surgeon: [name], MD;  Location: [place] [facility];  Service: Cardiovascular;  Laterality: N/A;   LEFT HEART CATH AND CORONARY ANGIOGRAPHY N/A 05/24/2020   Procedure: LEFT HEART CATH AND CORONARY ANGIOGRAPHY;  Surgeon: Burnell Blanks, MD;  Location: [place] [facility];  Service: Cardiovascular;  Laterality: N/A;   LESION REMOVAL     Lip and hand    LOWER EXTREMITY ANGIOGRAM Right 11/22/2020   Procedure: RIGHT LEG ANGIOGRAM;  Surgeon: [name], MD;  [facility];  Service: Vascular;  Laterality: Right;   LOWER EXTREMITY ANGIOGRAPHY N/A 04/18/2019   Procedure: LOWER EXTREMITY ANGIOGRAPHY;  Surgeon: [name], MD;  Location: [place] [facility];  Service: Cardiovascular;  Laterality: N/A;   NECK SURGERY     PERIPHERAL VASCULAR BALLOON ANGIOPLASTY  04/18/2019   Procedure: PERIPHERAL VASCULAR BALLOON ANGIOPLASTY;  Surgeon: [name], MD;  Location: [place] [facility];  Service: Cardiovascular;;   PERIPHERAL VASCULAR BALLOON ANGIOPLASTY Left 11/12/2020   Procedure: PERIPHERAL VASCULAR BALLOON ANGIOPLASTY;  Surgeon: [name], MD;  Location: [place] [facility];  Service: Cardiovascular;  Laterality: Left;  Failed PTA of superficial femoral artery.   PERIPHERAL VASCULAR INTERVENTION Right 03/21/2019   Procedure: PERIPHERAL VASCULAR INTERVENTION;  Surgeon: [name], MD;  Location: [place] [facility];  Service: Cardiovascular;  Laterality: Right;  superficial femoral    There were no vitals filed for this visit.   Subjective Assessment - 05/26/21 1126     Subjective Patient reports that he felt good after his last appointment for a day. However, the second day, his calves got really tight and sore.    Pertinent History history of COVID-19, "bad knees", chronic back and leg pain, neuropathy, heart attack earlier this year, HTN,  diabetes, CHF, a-fib, DDD, diabetic neuropathy    Limitations Standing;Walking    How long can you stand comfortably? Unable to stand unsupported without falling    How long can you walk comfortably? maybe 200 yards    Patient Stated Goals be able to go fishing (bank and boat), be able to mow his yard, be able to get up and down from the floor    Currently in Pain? Yes    Pain Score 2     Pain Location Leg    Pain Orientation Left                               [facility] Adult PT Treatment/Exercise - 05/26/21 0001       Knee/Hip Exercises: Stretches   Gastroc Stretch 4 reps;30 seconds;Both   seated     Knee/Hip Exercises: Aerobic   Nustep L3 x 10 minutes and L2 x 5 minutes      Knee/Hip Exercises: Standing  Forward Step Up Both;2 sets;10 reps;Hand Hold: 2;Step Height: 4"   on foam pad     Knee/Hip Exercises: Seated   Long Arc Quad AROM;Both;3 sets;10 reps    Other Seated Knee/Hip Exercises Heel/toe raises   1 minute                         PT Long Term Goals - 05/14/21 1231       PT LONG TERM GOAL #1   Title Patient will be independent with his HEP.    Time 6    Period Weeks    Status New    Target Date 06/25/21      PT LONG TERM GOAL #2   Title Patient will be able to stand unsupported for at least 1 minute for improved ability to shave.    Time 6    Period Weeks    Status New    Target Date 06/25/21      PT LONG TERM GOAL #3   Title Patient will be able to navigate at least 4 steps with a reciprical pattern with upper extremity support for improved function getting into and out of his house.    Time 6    Period Weeks    Status New    Target Date 06/25/21      PT LONG TERM GOAL #4   Title Patient will improve his five time sit to stand time to 40 seconds or less for improved ability to transfer.    Time 6    Period Weeks    Status New    Target Date 06/25/21                   Plan - 05/26/21 1140     Clinical  Impression Statement Treatment focused on seated interventions for improved lower extremity strength and reduced calf soreness. He required minimal cuing with today's interventions for proper exercise pacing and performance. Fatigue was his primary limiting factor with today's interventions as he required multiple seated rest breaks throughout treatment.  He reported feeling tired upon the conclusion of treatment. He continues to require skilled physical therapy to address his remaining impairments to maximize his functional mobility.    Personal Factors and Comorbidities Fitness;Comorbidity 1;Comorbidity 2;Comorbidity 3+;Time since onset of injury/illness/exacerbation    Comorbidities HTN, diabetes, CHF, a-fib, DDD, diabetic neuropathy, OA, latex and adhesive allergies,    Examination-Activity Limitations Bathing;Locomotion Level;Transfers;Bed Mobility;Reach Overhead;Bend;Carry;Squat;Stairs;Stand;Lift    Examination-Participation Restrictions Community Activity;Other;Yard Work    Stability/Clinical Decision Making Unstable/Unpredictable    Rehab Potential Fair    PT Frequency 3x / week    PT Duration 6 weeks    PT Treatment/Interventions ADLs/Self Care Home Management;Cryotherapy;Electrical Stimulation;Ultrasound;Gait training;Stair training;Functional mobility training;Therapeutic activities;Therapeutic exercise;Balance training;Neuromuscular re-education;Manual techniques;Patient/family education;Energy conservation    PT Next Visit Plan nustep, light lower extremity strengthening and balance interventions    Consulted and Agree with Plan of Care Patient             Patient will benefit from skilled therapeutic intervention in order to improve the following deficits and impairments:  Difficulty walking, Obesity, Decreased endurance, Decreased activity tolerance, Pain, Decreased balance, Decreased strength, Decreased mobility  Visit Diagnosis: Difficulty in walking, not elsewhere  classified  History of falling  Muscle weakness (generalized)     Problem List Patient Active Problem List   Diagnosis Date Noted   S/P femoral-popliteal bypass surgery 11/22/2020   PAOD (peripheral arterial occlusive disease) ([place])  11/22/2020   Non-ST elevation (NSTEMI) myocardial infarction ([place]) 05/23/2020   Orthostatic hypotension 01/01/2020   Statin myopathy 12/06/2019   Diarrhea 05/10/2018   PAF (paroxysmal atrial fibrillation) ([place]) 03/29/2018   Lacunar infarction ([place]) 03/29/2018   Unstable angina (HCC)    Chest pain 01/18/2018   CHF (congestive heart failure) ([place]) 10/21/2017   Anxiety 03/09/2017   Constipation 11/26/2016   Diabetes ([place]) 05/25/2016   Occlusion and stenosis of vertebral artery    Acute renal failure ([place]) 05/23/2016   Diabetic neuropathy ([place]) 01/13/2016   Depression 11/01/2015   Erectile dysfunction 01/23/2015   Morbid obesity ([place]) 01/23/2015   Periodontal disease 01/23/2015   Stopped smoking with greater than 30 pack year history 01/23/2015   Diabetic peripheral neuropathy associated with type 2 diabetes mellitus ([place]) 11/08/2014   Mucosal abnormality of stomach    History of colonic polyps    Abdominal pain 09/06/2014   GERD (gastroesophageal reflux disease) 09/06/2014   Low serum testosterone level 08/01/2014   DDD (degenerative disc disease), cervical 12/20/2013   Headache 08/07/2013   Medically noncompliant 10/25/2011   CAD S/P percutaneous coronary angioplasty 10/25/2011   MURMUR 04/16/2009   Hypertension associated with diabetes ([place]) 08/02/2008   Hyperlipidemia associated with type 2 diabetes mellitus ([place]) 07/31/2008   DISORDERS OF IRON METABOLISM 07/31/2008    Darlin Coco, PT 05/26/2021, 12:16 PM  [facility] [place], [name], [address] Phone: [phone]   Fax:  [phone]  Name: [name] [name] MRN: [id number] Date of Birth: 02-27-1951

## 2021-05-27 ENCOUNTER — Ambulatory Visit (INDEPENDENT_AMBULATORY_CARE_PROVIDER_SITE_OTHER): Payer: Medicare Other | Admitting: Family

## 2021-05-27 ENCOUNTER — Encounter: Payer: Self-pay | Admitting: Family

## 2021-05-27 VITALS — BP 138/68 | HR 93 | Temp 97.0°F | Ht 74.0 in | Wt 294.0 lb

## 2021-05-27 DIAGNOSIS — Z9861 Coronary angioplasty status: Secondary | ICD-10-CM

## 2021-05-27 DIAGNOSIS — E0842 Diabetes mellitus due to underlying condition with diabetic polyneuropathy: Secondary | ICD-10-CM

## 2021-05-27 DIAGNOSIS — E1159 Type 2 diabetes mellitus with other circulatory complications: Secondary | ICD-10-CM

## 2021-05-27 DIAGNOSIS — E1169 Type 2 diabetes mellitus with other specified complication: Secondary | ICD-10-CM

## 2021-05-27 DIAGNOSIS — K219 Gastro-esophageal reflux disease without esophagitis: Secondary | ICD-10-CM | POA: Diagnosis not present

## 2021-05-27 DIAGNOSIS — E211 Secondary hyperparathyroidism, not elsewhere classified: Secondary | ICD-10-CM | POA: Diagnosis not present

## 2021-05-27 DIAGNOSIS — K5900 Constipation, unspecified: Secondary | ICD-10-CM | POA: Diagnosis not present

## 2021-05-27 DIAGNOSIS — E1142 Type 2 diabetes mellitus with diabetic polyneuropathy: Secondary | ICD-10-CM

## 2021-05-27 DIAGNOSIS — I5032 Chronic diastolic (congestive) heart failure: Secondary | ICD-10-CM | POA: Diagnosis not present

## 2021-05-27 DIAGNOSIS — I152 Hypertension secondary to endocrine disorders: Secondary | ICD-10-CM | POA: Diagnosis not present

## 2021-05-27 DIAGNOSIS — Z91199 Patient's noncompliance with other medical treatment and regimen due to unspecified reason: Secondary | ICD-10-CM

## 2021-05-27 DIAGNOSIS — F419 Anxiety disorder, unspecified: Secondary | ICD-10-CM

## 2021-05-27 DIAGNOSIS — R011 Cardiac murmur, unspecified: Secondary | ICD-10-CM | POA: Diagnosis not present

## 2021-05-27 DIAGNOSIS — R300 Dysuria: Secondary | ICD-10-CM

## 2021-05-27 DIAGNOSIS — I509 Heart failure, unspecified: Secondary | ICD-10-CM | POA: Diagnosis not present

## 2021-05-27 DIAGNOSIS — Z794 Long term (current) use of insulin: Secondary | ICD-10-CM

## 2021-05-27 DIAGNOSIS — E1122 Type 2 diabetes mellitus with diabetic chronic kidney disease: Secondary | ICD-10-CM | POA: Diagnosis not present

## 2021-05-27 DIAGNOSIS — E785 Hyperlipidemia, unspecified: Secondary | ICD-10-CM

## 2021-05-27 DIAGNOSIS — I2510 Atherosclerotic heart disease of native coronary artery without angina pectoris: Secondary | ICD-10-CM

## 2021-05-27 DIAGNOSIS — F331 Major depressive disorder, recurrent, moderate: Secondary | ICD-10-CM

## 2021-05-27 DIAGNOSIS — I129 Hypertensive chronic kidney disease with stage 1 through stage 4 chronic kidney disease, or unspecified chronic kidney disease: Secondary | ICD-10-CM | POA: Diagnosis not present

## 2021-05-27 DIAGNOSIS — D638 Anemia in other chronic diseases classified elsewhere: Secondary | ICD-10-CM | POA: Diagnosis not present

## 2021-05-27 DIAGNOSIS — N189 Chronic kidney disease, unspecified: Secondary | ICD-10-CM | POA: Diagnosis not present

## 2021-05-27 LAB — URINALYSIS, COMPLETE
Bilirubin, UA: NEGATIVE
Ketones, UA: NEGATIVE
Nitrite, UA: NEGATIVE
Specific Gravity, UA: 1.025 (ref 1.005–1.030)
Urobilinogen, Ur: 2 mg/dL — ABNORMAL HIGH (ref 0.2–1.0)
pH, UA: 5 (ref 5.0–7.5)

## 2021-05-27 LAB — MICROSCOPIC EXAMINATION: Renal Epithel, UA: NONE SEEN /hpf

## 2021-05-27 LAB — BAYER DCA HB A1C WAIVED: HB A1C (BAYER DCA - WAIVED): 8.8 % — ABNORMAL HIGH (ref 4.8–5.6)

## 2021-05-27 NOTE — Progress Notes (Signed)
Subjective:    Patient ID: [name] [name], male    DOB: Jul 02, 1950, 71 y.o.   MRN: [id number]  Chief Complaint  Patient presents with   Medical Management of Chronic Issues   PT presents to the office today for chronic follow up. PT had NSTEMI on 07/30/16 and 05/23/20 and stent placed. Pt is followed by Cardiologists every 4 months. Pt currently in a clinical trial for hyperlipidemia. He is followed by Ortho for osteoarthritis and is currently getting injections in bilateral knees and back. Pt is followed by Pain Management every 2-3 months. Followed by Nephrologists every 3 months for CKD.    He is followed by Vascular and had stent placed in his right leg and had a left femoral-popliteal  bypass on 11/22/20. He had a abdominal aortogram with lower extremity on 01/28/21. Hypertension This is a chronic problem. The current episode started more than 1 year ago. The problem has been resolved since onset. The problem is controlled. Associated symptoms include anxiety, malaise/fatigue, peripheral edema and shortness of breath. Pertinent negatives include no blurred vision. Risk factors for coronary artery disease include dyslipidemia, diabetes mellitus, obesity, male gender and sedentary lifestyle. The current treatment provides moderate improvement. Hypertensive end-organ damage includes kidney disease, CAD/MI and heart failure.  Congestive Heart Failure Presents for follow-up visit. Associated symptoms include edema, fatigue and shortness of breath. The symptoms have been stable.  Gastroesophageal Reflux He complains of belching and heartburn. This is a chronic problem. The current episode started more than 1 year ago. The problem occurs occasionally. Associated symptoms include fatigue. He has tried a PPI for the symptoms. The treatment provided moderate relief.  Hyperlipidemia This is a chronic problem. The current episode started more than 1 year ago. The problem is controlled. Recent lipid tests  were reviewed and are normal. Exacerbating diseases include obesity. Associated symptoms include shortness of breath. Current antihyperlipidemic treatment includes statins. The current treatment provides moderate improvement of lipids. Risk factors for coronary artery disease include dyslipidemia, diabetes mellitus, male sex, hypertension and a sedentary lifestyle.  Diabetes He presents for his follow-up diabetic visit. He has type 2 diabetes mellitus. Hypoglycemia symptoms include nervousness/anxiousness. Associated symptoms include fatigue and foot paresthesias. Pertinent negatives for diabetes include no blurred vision. Symptoms are stable. Diabetic complications include heart disease, nephropathy and peripheral neuropathy. Risk factors for coronary artery disease include dyslipidemia, diabetes mellitus, male sex, hypertension and sedentary lifestyle. He is following a generally healthy diet. (Does not check at home)  Arthritis Presents for follow-up visit. He complains of pain and stiffness. The symptoms have been stable. Affected locations include the right knee, left knee, left MCP and right MCP (back). His pain is at a severity of 10/10. Associated symptoms include fatigue.  Depression        This is a chronic problem.  The current episode started more than 1 year ago.   The onset quality is gradual.   The problem occurs intermittently.  Associated symptoms include fatigue.  Past medical history includes anxiety.   Constipation This is a chronic problem. The current episode started more than 1 year ago. The problem has been waxing and waning since onset. Risk factors include obesity. The treatment provided moderate relief.  Anxiety Presents for follow-up visit. Symptoms include depressed mood, excessive worry, irritability, nervous/anxious behavior and shortness of breath. Symptoms occur occasionally. The severity of symptoms is moderate.       Review of Systems  Constitutional:  Positive for  fatigue, irritability and malaise/fatigue.  Eyes:  Negative for blurred vision.  Respiratory:  Positive for shortness of breath.   Gastrointestinal:  Positive for constipation and heartburn.  Musculoskeletal:  Positive for arthritis and stiffness.  Psychiatric/Behavioral:  Positive for depression. The patient is nervous/anxious.   All other systems reviewed and are negative.     Objective:   Physical Exam Vitals reviewed.  Constitutional:      General: He is not in acute distress.    Appearance: He is well-developed. He is obese.  HENT:     Head: Normocephalic.     Right Ear: Tympanic membrane normal.     Left Ear: Tympanic membrane normal.  Eyes:     General:        Right eye: No discharge.        Left eye: No discharge.     Pupils: Pupils are equal, round, and reactive to light.  Neck:     Thyroid: No thyromegaly.  Cardiovascular:     Rate and Rhythm: Normal rate and regular rhythm.     Heart sounds: Murmur heard.  Pulmonary:     Effort: Pulmonary effort is normal. No respiratory distress.     Breath sounds: Normal breath sounds. No wheezing.  Abdominal:     General: Bowel sounds are normal. There is no distension.     Palpations: Abdomen is soft.     Tenderness: There is no abdominal tenderness.  Musculoskeletal:        General: No tenderness. Normal range of motion.     Cervical back: Normal range of motion and neck supple.  Skin:    General: Skin is warm and dry.     Findings: No erythema or rash.  Neurological:     Mental Status: He is alert and oriented to person, place, and time.     Cranial Nerves: No cranial nerve deficit.     Deep Tendon Reflexes: Reflexes are normal and symmetric.  Psychiatric:        Behavior: Behavior normal.        Thought Content: Thought content normal.        Judgment: Judgment normal.      BP 138/68    Pulse 93    Temp (!) 97 F (36.1 C) (Temporal)    Ht $R'6\' 2"'Xq$  (1.88 m)    Wt 294 lb (133.4 kg)    BMI 37.75 kg/m       Assessment & Plan:  [name] [name] comes in today with chief complaint of Medical Management of Chronic Issues   Diagnosis and orders addressed:  1. Hypertension associated with diabetes ([place]) - CMP14+EGFR - CBC with Differential/Platelet  2. Congestive heart failure, unspecified HF chronicity, unspecified heart failure type ([place]) - CMP14+EGFR - CBC with Differential/Platelet  3. CAD S/P percutaneous coronary angioplasty - CMP14+EGFR - CBC with Differential/Platelet  4. Gastroesophageal reflux disease without esophagitis - CMP14+EGFR - CBC with Differential/Platelet  5. Hyperlipidemia associated with type 2 diabetes mellitus ([facility]) - CMP14+EGFR - CBC with Differential/Platelet  6. Diabetic polyneuropathy associated with diabetes mellitus due to underlying condition ([place]) - CMP14+EGFR - CBC with Differential/Platelet  7. Diabetic peripheral neuropathy associated with type 2 diabetes mellitus ([facility]) - CMP14+EGFR - CBC with Differential/Platelet  8. Type 2 diabetes mellitus with diabetic polyneuropathy, with long-term current use of insulin ([facility]) - Bayer DCA Hb A1c Waived - CMP14+EGFR - CBC with Differential/Platelet  9. Medically noncompliant - CMP14+EGFR - CBC with Differential/Platelet  10. Morbid obesity ([place]) - CMP14+EGFR - CBC with Differential/Platelet  11. MURMUR - CMP14+EGFR - CBC with Differential/Platelet  12. Moderate episode of recurrent major depressive disorder (HCC) - CMP14+EGFR - CBC with Differential/Platelet  13. Constipation, unspecified constipation type - CMP14+EGFR - CBC with Differential/Platelet  14. Anxiety - CMP14+EGFR - CBC with Differential/Platelet   Labs pending Health Maintenance reviewed Diet and exercise encouraged  Follow up plan: 3 months    [name], FNP

## 2021-05-27 NOTE — Patient Instructions (Signed)
Peripheral Neuropathy ?Peripheral neuropathy is a type of nerve damage. It affects nerves that carry signals between the spinal cord and the arms, legs, and the rest of the body (peripheral nerves). It does not affect nerves in the spinal cord or brain. In peripheral neuropathy, one nerve or a group of nerves may be damaged. Peripheral neuropathy is a broad category that includes many specific nerve disorders, like diabetic neuropathy, hereditary neuropathy, and carpal tunnel syndrome. ?What are the causes? ?This condition may be caused by: ?Diabetes. This is the most common cause of peripheral neuropathy. ?Nerve injury. ?Pressure or stress on a nerve that lasts a long time. ?Lack (deficiency) of B vitamins. This can result from alcoholism, poor diet, or a restricted diet. ?Infections. ?Autoimmune diseases, such as rheumatoid arthritis and systemic lupus erythematosus. ?Nerve diseases that are passed from parent to child (inherited). ?Some medicines, such as cancer medicines (chemotherapy). ?Poisonous (toxic) substances, such as lead and mercury. ?Too little blood flowing to the legs. ?Kidney disease. ?Thyroid disease. ?In some cases, the cause of this condition is not known. ?What are the signs or symptoms? ?Symptoms of this condition depend on which of your nerves is damaged. Common symptoms include: ?Loss of feeling (numbness) in the feet, hands, or both. ?Tingling in the feet, hands, or both. ?Burning pain. ?Very sensitive skin. ?Weakness. ?Not being able to move a part of the body (paralysis). ?Muscle twitching. ?Clumsiness or poor coordination. ?Loss of balance. ?Not being able to control your bladder. ?Feeling dizzy. ?Sexual problems. ?How is this diagnosed? ?Diagnosing and finding the cause of peripheral neuropathy can be difficult. Your health care provider will take your medical history and do a physical exam. A neurological exam will also be done. This involves checking things that are affected by your  brain, spinal cord, and nerves (nervous system). For example, your health care provider will check your reflexes, how you move, and what you can feel. ?You may have other tests, such as: ?Blood tests. ?Electromyogram (EMG) and nerve conduction tests. These tests check nerve function and how well the nerves are controlling the muscles. ?Imaging tests, such as CT scans or MRI to rule out other causes of your symptoms. ?Removing a small piece of nerve to be examined in a lab (nerve biopsy). ?Removing and examining a small amount of the fluid that surrounds the brain and spinal cord (lumbar puncture). ?How is this treated? ?Treatment for this condition may involve: ?Treating the underlying cause of the neuropathy, such as diabetes, kidney disease, or vitamin deficiencies. ?Stopping medicines that can cause neuropathy, such as chemotherapy. ?Medicine to help relieve pain. Medicines may include: ?Prescription or over-the-counter pain medicine. ?Antiseizure medicine. ?Antidepressants. ?Pain-relieving patches that are applied to painful areas of skin. ?Surgery to relieve pressure on a nerve or to destroy a nerve that is causing pain. ?Physical therapy to help improve movement and balance. ?Devices to help you move around (assistive devices). ?Follow these instructions at home: ?Medicines ?Take over-the-counter and prescription medicines only as told by your health care provider. Do not take any other medicines without first asking your health care provider. ?Do not drive or use heavy machinery while taking prescription pain medicine. ?Lifestyle ? ?Do not use any products that contain nicotine or tobacco, such as cigarettes and e-cigarettes. Smoking keeps blood from reaching damaged nerves. If you need help quitting, ask your health care provider. ?Avoid or limit alcohol. Too much alcohol can cause a vitamin B deficiency, and vitamin B is needed for healthy nerves. ?Eat a   healthy diet. This includes: ?Eating foods that are  high in fiber, such as fresh fruits and vegetables, whole grains, and beans. ?Limiting foods that are high in fat and processed sugars, such as fried or sweet foods. ?General instructions ? ?If you have diabetes, work closely with your health care provider to keep your blood sugar under control. ?If you have numbness in your feet: ?Check every day for signs of injury or infection. Watch for redness, warmth, and swelling. ?Wear padded socks and comfortable shoes. These help protect your feet. ?Develop a good support system. Living with peripheral neuropathy can be stressful. Consider talking with a mental health specialist or joining a support group. ?Use assistive devices and attend physical therapy as told by your health care provider. This may include using a walker or a cane. ?Keep all follow-up visits as told by your health care provider. This is important. ?Contact a health care provider if: ?You have new signs or symptoms of peripheral neuropathy. ?You are struggling emotionally from dealing with peripheral neuropathy. ?Your pain is not well-controlled. ?Get help right away if: ?You have an injury or infection that is not healing normally. ?You develop new weakness in an arm or leg. ?You have fallen or do so frequently. ?Summary ?Peripheral neuropathy is when the nerves in the arms, or legs are damaged, resulting in numbness, weakness, or pain. ?There are many causes of peripheral neuropathy, including diabetes, pinched nerves, vitamin deficiencies, autoimmune disease, and hereditary conditions. ?Diagnosing and finding the cause of peripheral neuropathy can be difficult. Your health care provider will take your medical history, do a physical exam, and do tests, including blood tests and nerve function tests. ?Treatment involves treating the underlying cause of the neuropathy and taking medicines to help control pain. Physical therapy and assistive devices may also help. ?This information is not intended to  replace advice given to you by your health care provider. Make sure you discuss any questions you have with your health care provider. ?Document Revised: 02/13/2020 Document Reviewed: 02/13/2020 ?[employer] Patient Education ? 2022 [employer] ? ?

## 2021-05-28 ENCOUNTER — Other Ambulatory Visit: Payer: Self-pay | Admitting: Family

## 2021-05-28 LAB — CBC WITH DIFFERENTIAL/PLATELET
Basophils Absolute: 0.1 10*3/uL (ref 0.0–0.2)
Basos: 1 %
EOS (ABSOLUTE): 0.1 10*3/uL (ref 0.0–0.4)
Eos: 2 %
Hematocrit: 40.8 % (ref 37.5–51.0)
Hemoglobin: 11.7 g/dL — ABNORMAL LOW (ref 13.0–17.7)
Immature Grans (Abs): 0.1 10*3/uL (ref 0.0–0.1)
Immature Granulocytes: 1 %
Lymphocytes Absolute: 1.6 10*3/uL (ref 0.7–3.1)
Lymphs: 18 %
MCH: 21.1 pg — ABNORMAL LOW (ref 26.6–33.0)
MCHC: 28.7 g/dL — ABNORMAL LOW (ref 31.5–35.7)
MCV: 74 fL — ABNORMAL LOW (ref 79–97)
Monocytes Absolute: 0.5 10*3/uL (ref 0.1–0.9)
Monocytes: 6 %
Neutrophils Absolute: 6.7 10*3/uL (ref 1.4–7.0)
Neutrophils: 72 %
Platelets: 335 10*3/uL (ref 150–450)
RBC: 5.55 x10E6/uL (ref 4.14–5.80)
RDW: 15.4 % (ref 11.6–15.4)
WBC: 9.1 10*3/uL (ref 3.4–10.8)

## 2021-05-28 LAB — CMP14+EGFR
ALT: 16 IU/L (ref 0–44)
AST: 18 IU/L (ref 0–40)
Albumin/Globulin Ratio: 1.8 (ref 1.2–2.2)
Albumin: 4.6 g/dL (ref 3.8–4.8)
Alkaline Phosphatase: 52 IU/L (ref 44–121)
BUN/Creatinine Ratio: 15 (ref 10–24)
BUN: 23 mg/dL (ref 8–27)
Bilirubin Total: 0.3 mg/dL (ref 0.0–1.2)
CO2: 26 mmol/L (ref 20–29)
Calcium: 9.7 mg/dL (ref 8.6–10.2)
Chloride: 99 mmol/L (ref 96–106)
Creatinine, Ser: 1.57 mg/dL — ABNORMAL HIGH (ref 0.76–1.27)
Globulin, Total: 2.6 g/dL (ref 1.5–4.5)
Glucose: 130 mg/dL — ABNORMAL HIGH (ref 70–99)
Potassium: 4.9 mmol/L (ref 3.5–5.2)
Sodium: 139 mmol/L (ref 134–144)
Total Protein: 7.2 g/dL (ref 6.0–8.5)
eGFR: 47 mL/min/{1.73_m2} — ABNORMAL LOW (ref >59)

## 2021-05-28 MED ORDER — MOUNJARO 5 MG/0.5ML ~~LOC~~ SOAJ: 5.0000 mg | SUBCUTANEOUS | 1 refills | Status: DC

## 2021-05-28 MED ORDER — FLUCONAZOLE 150 MG PO TABS: 150.0000 mg | ORAL_TABLET | ORAL | 0 refills | Status: DC | PRN

## 2021-05-29 ENCOUNTER — Other Ambulatory Visit: Payer: Self-pay | Admitting: Family

## 2021-05-29 ENCOUNTER — Ambulatory Visit: Payer: Medicare Other

## 2021-05-29 ENCOUNTER — Other Ambulatory Visit: Payer: Self-pay

## 2021-05-29 DIAGNOSIS — M6281 Muscle weakness (generalized): Secondary | ICD-10-CM | POA: Diagnosis not present

## 2021-05-29 DIAGNOSIS — R2681 Unsteadiness on feet: Secondary | ICD-10-CM | POA: Diagnosis not present

## 2021-05-29 DIAGNOSIS — Z9181 History of falling: Secondary | ICD-10-CM | POA: Diagnosis not present

## 2021-05-29 DIAGNOSIS — R262 Difficulty in walking, not elsewhere classified: Secondary | ICD-10-CM | POA: Diagnosis not present

## 2021-05-29 LAB — URINE CULTURE

## 2021-05-29 MED ORDER — AMOXICILLIN-POT CLAVULANATE 875-125 MG PO TABS: 1.0000 | ORAL_TABLET | Freq: Two times a day (BID) | ORAL | 0 refills | Status: DC

## 2021-05-29 NOTE — Therapy (Signed)
[name], [name], [address] Phone: [phone]   Fax:  [phone]  Physical Therapy Treatment  Patient Details  Name: [name] [name] MRN: [id number] Date of Birth: 02/23/1951 Referring Provider (PT): [name], FNP   Encounter Date: 05/29/2021   PT End of Session - 05/29/21 1138     Visit Number 4    Number of Visits 12    Date for PT Re-Evaluation 08/01/21    PT Start Time 8127    PT Stop Time 1205    PT Time Calculation (min) 39 min    Activity Tolerance Patient tolerated treatment well;Patient limited by fatigue    Behavior During Therapy [facility] for tasks assessed/performed             Past Medical History:  Diagnosis Date   Anxiety    Arthritis    Atrial fibrillation ([place])    CAD (coronary artery disease)    a. 2010: DES to CTO of RCA. EF 55% b. 07/2016: cath showing total occlusion within previously placed RCA stent (collaterals present), severe stenosis along LCx and OM1 (treated with 2 overlapping DES). c. repeat cath in 01/2018 showing patent stents along LCx and OM with CTO of D2, CTO of distal LCx, and CTO of RCA with collaterals present overall unchanged since 2018 with medical management recom   Cellulitis and abscess rt groin    Complication of anesthesia    " I woke up during a colonoscopy "      Depression    Diabetes mellitus    Diastolic CHF ([place])    Disorders of iron metabolism    Dysrhythmia    GERD (gastroesophageal reflux disease)    Hyperlipidemia    Hypertension    Low serum testosterone level    Medically noncompliant    Myocardial infarction [facility])    05-23-20    Past Surgical History:  Procedure Laterality Date   ABDOMINAL AORTOGRAM W/LOWER EXTREMITY N/A 03/21/2019   Procedure: ABDOMINAL AORTOGRAM W/LOWER EXTREMITY;  Surgeon: [name], MD;  Location: [place] [facility];  Service: Cardiovascular;  Laterality: N/A;   ABDOMINAL AORTOGRAM W/LOWER EXTREMITY N/A  11/12/2020   Procedure: ABDOMINAL AORTOGRAM W/LOWER EXTREMITY;  Surgeon: [name], MD;  Location: [place] [facility];  Service: Cardiovascular;  Laterality: N/A;   ABDOMINAL AORTOGRAM W/LOWER EXTREMITY N/A 01/28/2021   Procedure: ABDOMINAL AORTOGRAM W/LOWER EXTREMITY;  Surgeon: [name], MD;  Location: [place] [facility];  Service: Cardiovascular;  Laterality: N/A;   BACK SURGERY  2015   ACDF by Dr. [name]   COLONOSCOPY N/A 10/01/2014   Dr. [name]: multiple tubular adenomas removed, colonic diverticulosis, redundant colon. next tcs advised for 09/2017. PATIENT NEEDS PROPOFOL FOR FAILED CONSCIOUS SEDATION   CORONARY STENT INTERVENTION N/A 07/30/2016   Procedure: Coronary Stent Intervention;  Surgeon: [name], MD;  Location: [place] [facility];  Service: Cardiovascular;  Laterality: N/A;   CORONARY STENT PLACEMENT  2000   By Dr. Olevia Perches   EP IMPLANTABLE DEVICE N/A 05/25/2016   Procedure: Loop Recorder Insertion;  Surgeon: [name], MD;  Location: [place] [facility];  Service: Cardiovascular;  Laterality: N/A;   ESOPHAGOGASTRODUODENOSCOPY     esophagus stretched remotely at [facility]   ESOPHAGOGASTRODUODENOSCOPY N/A 10/01/2014   Dr. [name]: patchy mottling/erythema and minimal polypoid appearance of gastric mucosa. bx with mild inlammation but no H.pylori   FEMORAL-POPLITEAL BYPASS GRAFT Left 11/22/2020   Procedure: LEFT FEMORAL-POPLITEAL BYPASS GRAFT;  Surgeon: [name],  Butch Penny, MD;  Location: [place];  Service: Vascular;  Laterality: Left;   HERNIA REPAIR  [id number]   umbilical   INSERTION OF ILIAC STENT Right 11/22/2020   Procedure: INSERTION OF ELUVIA STENT INTO RIGHT DISTAL SUPERFICIAL FEMORAL ARTERY;  Surgeon: [name], MD;  Location: [place];  Service: Vascular;  Laterality: Right;   LEFT HEART CATH AND CORONARY ANGIOGRAPHY N/A 07/30/2016   Procedure: Left Heart Cath and Coronary Angiography;  Surgeon: [name], MD;  Location: [place] [facility];  Service: Cardiovascular;   Laterality: N/A;   LEFT HEART CATH AND CORONARY ANGIOGRAPHY N/A 01/19/2018   Procedure: LEFT HEART CATH AND CORONARY ANGIOGRAPHY;  Surgeon: [name], MD;  Location: [place] [facility];  Service: Cardiovascular;  Laterality: N/A;   LEFT HEART CATH AND CORONARY ANGIOGRAPHY N/A 05/24/2020   Procedure: LEFT HEART CATH AND CORONARY ANGIOGRAPHY;  Surgeon: Burnell Blanks, MD;  Location: [place] [facility];  Service: Cardiovascular;  Laterality: N/A;   LESION REMOVAL     Lip and hand    LOWER EXTREMITY ANGIOGRAM Right 11/22/2020   Procedure: RIGHT LEG ANGIOGRAM;  Surgeon: [name], MD;  Location: [facility];  Service: Vascular;  Laterality: Right;   LOWER EXTREMITY ANGIOGRAPHY N/A 04/18/2019   Procedure: LOWER EXTREMITY ANGIOGRAPHY;  Surgeon: [name], MD;  Location: [place] [facility];  Service: Cardiovascular;  Laterality: N/A;   NECK SURGERY     PERIPHERAL VASCULAR BALLOON ANGIOPLASTY  04/18/2019   Procedure: PERIPHERAL VASCULAR BALLOON ANGIOPLASTY;  Surgeon: [name], MD;  Location: [place] [facility];  Service: Cardiovascular;;   PERIPHERAL VASCULAR BALLOON ANGIOPLASTY Left 11/12/2020   Procedure: PERIPHERAL VASCULAR BALLOON ANGIOPLASTY;  Surgeon: [name], MD;  Location: [place] [facility];  Service: Cardiovascular;  Laterality: Left;  Failed PTA of superficial femoral artery.   PERIPHERAL VASCULAR INTERVENTION Right 03/21/2019   Procedure: PERIPHERAL VASCULAR INTERVENTION;  Surgeon: [name], MD;  Location: [place] [facility];  Service: Cardiovascular;  Laterality: Right;  superficial femoral    There were no vitals filed for this visit.   Subjective Assessment - 05/29/21 1127     Subjective Patient reports that he fell in his yard yesterday. He notes that he had been doing yardwork on his tractor. He was walking back to his house when his legs buckled. He is currently having low back pain, but he notes that this is where he gets injections for his  sciatic nerve. He was able to get up and walk back to her house.    Pertinent History history of COVID-19, "bad knees", chronic back and leg pain, neuropathy, heart attack earlier this year, HTN, diabetes, CHF, a-fib, DDD, diabetic neuropathy    Limitations Standing;Walking    How long can you stand comfortably? Unable to stand unsupported without falling    How long can you walk comfortably? maybe 200 yards    Patient Stated Goals be able to go fishing (bank and boat), be able to mow his yard, be able to get up and down from the floor    Currently in Pain? Yes    Pain Score 6     Pain Location Back    Pain Orientation Left;Lower    Pain Descriptors / Indicators Throbbing                               [facility] Adult PT Treatment/Exercise - 05/29/21  0001       Knee/Hip Exercises: Aerobic   Nustep L2 x 15 minutes      Knee/Hip Exercises: Standing   Hip Flexion Both;20 reps   with toe tap on 6" step     Knee/Hip Exercises: Seated   Long Arc Quad AROM;Both;3 sets;10 reps                          PT Long Term Goals - 05/14/21 1231       PT LONG TERM GOAL #1   Title Patient will be independent with his HEP.    Time 6    Period Weeks    Status New    Target Date 06/25/21      PT LONG TERM GOAL #2   Title Patient will be able to stand unsupported for at least 1 minute for improved ability to shave.    Time 6    Period Weeks    Status New    Target Date 06/25/21      PT LONG TERM GOAL #3   Title Patient will be able to navigate at least 4 steps with a reciprical pattern with upper extremity support for improved function getting into and out of his house.    Time 6    Period Weeks    Status New    Target Date 06/25/21      PT LONG TERM GOAL #4   Title Patient will improve his five time sit to stand time to 40 seconds or less for improved ability to transfer.    Time 6    Period Weeks    Status New    Target Date 06/25/21                    Plan - 05/29/21 1140     Clinical Impression Statement Patient presented to treatment late which limited his ability to be progressed with new interventions. He also presented to treatment reporting a fall in his yard yesterday. However, he exhibited no signs or symptoms of any injury. He reported no pain or discomfort with any of today's interventions. Fatigue was his primary limitation with today's activities as he required multiple seated rest breaks throughout treatment. He reported feeling tired upon the conclusion of treatment. He continues to require skilled physical therapy to address his remaining impairments to maximize his functional mobiity.    Personal Factors and Comorbidities Fitness;Comorbidity 1;Comorbidity 2;Comorbidity 3+;Time since onset of injury/illness/exacerbation    Comorbidities HTN, diabetes, CHF, a-fib, DDD, diabetic neuropathy, OA, latex and adhesive allergies,    Examination-Activity Limitations Bathing;Locomotion Level;Transfers;Bed Mobility;Reach Overhead;Bend;Carry;Squat;Stairs;Stand;Lift    Examination-Participation Restrictions Community Activity;Other;Yard Work    Stability/Clinical Decision Making Unstable/Unpredictable    Rehab Potential Fair    PT Frequency 3x / week    PT Duration 6 weeks    PT Treatment/Interventions ADLs/Self Care Home Management;Cryotherapy;Electrical Stimulation;Ultrasound;Gait training;Stair training;Functional mobility training;Therapeutic activities;Therapeutic exercise;Balance training;Neuromuscular re-education;Manual techniques;Patient/family education;Energy conservation    PT Next Visit Plan nustep, light lower extremity strengthening and balance interventions    Consulted and Agree with Plan of Care Patient             Patient will benefit from skilled therapeutic intervention in order to improve the following deficits and impairments:  Difficulty walking, Obesity, Decreased endurance, Decreased activity  tolerance, Pain, Decreased balance, Decreased strength, Decreased mobility  Visit Diagnosis: Difficulty in walking, not elsewhere classified  History of falling  Muscle weakness (  generalized)     Problem List Patient Active Problem List   Diagnosis Date Noted   S/P femoral-popliteal bypass surgery 11/22/2020   PAOD (peripheral arterial occlusive disease) ([place]) 11/22/2020   Non-ST elevation (NSTEMI) myocardial infarction ([place]) 05/23/2020   Orthostatic hypotension 01/01/2020   Statin myopathy 12/06/2019   Diarrhea 05/10/2018   PAF (paroxysmal atrial fibrillation) ([place]) 03/29/2018   Lacunar infarction ([place]) 03/29/2018   Unstable angina ([place])    Chest pain 01/18/2018   CHF (congestive heart failure) ([place]) 10/21/2017   Anxiety 03/09/2017   Constipation 11/26/2016   Diabetes ([place]) 05/25/2016   Occlusion and stenosis of vertebral artery    Acute renal failure ([place]) 05/23/2016   Diabetic neuropathy ([place]) 01/13/2016   Depression 11/01/2015   Erectile dysfunction 01/23/2015   Morbid obesity ([place]) 01/23/2015   Periodontal disease 01/23/2015   Stopped smoking with greater than 30 pack year history 01/23/2015   Diabetic peripheral neuropathy associated with type 2 diabetes mellitus ([place]) 11/08/2014   Mucosal abnormality of stomach    History of colonic polyps    GERD (gastroesophageal reflux disease) 09/06/2014   Low serum testosterone level 08/01/2014   DDD (degenerative disc disease), cervical 12/20/2013   Headache 08/07/2013   Medically noncompliant 10/25/2011   CAD S/P percutaneous coronary angioplasty 10/25/2011   MURMUR 04/16/2009   Hypertension associated with diabetes ([place]) 08/02/2008   Hyperlipidemia associated with type 2 diabetes mellitus ([place]) 07/31/2008   DISORDERS OF IRON METABOLISM 07/31/2008    Darlin Coco, PT 05/29/2021, 12:22 PM  [facility] [place], [name], [address] Phone: [phone]   Fax:   [phone]  Name: [name] [name] MRN: [id number] Date of Birth: [birth year]

## 2021-06-03 ENCOUNTER — Ambulatory Visit: Payer: Medicare Other

## 2021-06-10 DIAGNOSIS — M47816 Spondylosis without myelopathy or radiculopathy, lumbar region: Secondary | ICD-10-CM | POA: Diagnosis not present

## 2021-06-10 DIAGNOSIS — M48061 Spinal stenosis, lumbar region without neurogenic claudication: Secondary | ICD-10-CM | POA: Diagnosis not present

## 2021-06-10 DIAGNOSIS — M5136 Other intervertebral disc degeneration, lumbar region: Secondary | ICD-10-CM | POA: Diagnosis not present

## 2021-06-10 DIAGNOSIS — G894 Chronic pain syndrome: Secondary | ICD-10-CM | POA: Diagnosis not present

## 2021-06-12 DIAGNOSIS — E1129 Type 2 diabetes mellitus with other diabetic kidney complication: Secondary | ICD-10-CM | POA: Diagnosis not present

## 2021-06-12 DIAGNOSIS — I5032 Chronic diastolic (congestive) heart failure: Secondary | ICD-10-CM | POA: Diagnosis not present

## 2021-06-12 DIAGNOSIS — E1122 Type 2 diabetes mellitus with diabetic chronic kidney disease: Secondary | ICD-10-CM | POA: Diagnosis not present

## 2021-06-12 DIAGNOSIS — R809 Proteinuria, unspecified: Secondary | ICD-10-CM | POA: Diagnosis not present

## 2021-06-12 DIAGNOSIS — D508 Other iron deficiency anemias: Secondary | ICD-10-CM | POA: Diagnosis not present

## 2021-06-12 DIAGNOSIS — E559 Vitamin D deficiency, unspecified: Secondary | ICD-10-CM | POA: Diagnosis not present

## 2021-06-12 DIAGNOSIS — N189 Chronic kidney disease, unspecified: Secondary | ICD-10-CM | POA: Diagnosis not present

## 2021-06-12 DIAGNOSIS — I129 Hypertensive chronic kidney disease with stage 1 through stage 4 chronic kidney disease, or unspecified chronic kidney disease: Secondary | ICD-10-CM | POA: Diagnosis not present

## 2021-06-17 NOTE — Progress Notes (Signed)
Cardiology Office Note    Date:  06/25/2021   ID:  [name] [name], DOB 1951/05/16, MRN [id number]  PCP:  [name], FNP  Cardiologist: Jenkins Rouge, MD    No chief complaint on file.    History of Present Illness:    71 y.o. with history of PAF, PAD, HTN, HLD IDDM prior CVA. Extensive history of CAD with most recent cath 05/24/20 resulting in stents to circumflex/OM for in stent restenosis Chronic CTO RCA with good left to right collaterals Occluded D2 and distal circumflex with Left to left collaterals EF 50-55% by TTE June 2021 Has had PAF with conversion with amiodarone ? Some dizziness with medicine and dose decreased by primary 07/18/20 to 100 mg daily Also noted BP to be a bit low with postural dizziness Of note he is on lasix and [place]. He also takes imdur 30 mg and Toprol 50 mg daily   He sees Dr Lovena Le for his PAF and had ILR implanted for cryptogenic stroke 07/18/20 seen by primary [name] and felt terrible She did ECG but failed to mention he was back in afib His ARB seems to drop his BP too low and he was started on Farxiga while still taking lasix daily He stopped his amiodarone felt terrible on it Seen by [facility] 04/22/21 3 episodes afib lasting hours. Mentioned starting Multaq but not done    He has significant back problems and may need surgery  Or injection I told him the earliest This could be done is July given recent stent and need for plavix Continues to have sciatic pain Getting PT   PVD followed by [name] Stents to right SFA 04/17/19 unable to recanalize left  Had hybrid procedure 11/22/20 with  Left fem-pop and stent right SFA Repeat angio 01/28/21 for ulcers Right stent patient occluded tibial vessels single vessel run off collaterals to PT at ankle Left patient graft single vessel run off no intervention done   Doing well no angina taking lasix as needed but has significant LE edema   Still bothered by swelling in left lag Has f/u with Dr [name] 07/28/21   Past  Medical History:  Diagnosis Date   Anxiety    Arthritis    Atrial fibrillation ([place])    CAD (coronary artery disease)    a. 2010: DES to CTO of RCA. EF 55% b. 07/2016: cath showing total occlusion within previously placed RCA stent (collaterals present), severe stenosis along LCx and OM1 (treated with 2 overlapping DES). c. repeat cath in 01/2018 showing patent stents along LCx and OM with CTO of D2, CTO of distal LCx, and CTO of RCA with collaterals present overall unchanged since 2018 with medical management recom   Cellulitis and abscess rt groin    Complication of anesthesia    " I woke up during a colonoscopy "      Depression    Diabetes mellitus    Diastolic CHF ([place])    Disorders of iron metabolism    Dysrhythmia    GERD (gastroesophageal reflux disease)    Hyperlipidemia    Hypertension    Low serum testosterone level    Medically noncompliant    Myocardial infarction [facility])    05-23-20    Past Surgical History:  Procedure Laterality Date   ABDOMINAL AORTOGRAM W/LOWER EXTREMITY N/A 03/21/2019   Procedure: ABDOMINAL AORTOGRAM W/LOWER EXTREMITY;  Surgeon: [name], MD;  Location: [place] [facility];  Service: Cardiovascular;  Laterality: N/A;  ABDOMINAL AORTOGRAM W/LOWER EXTREMITY N/A 11/12/2020   Procedure: ABDOMINAL AORTOGRAM W/LOWER EXTREMITY;  Surgeon: [name], MD;  Location: [place] [facility];  Service: Cardiovascular;  Laterality: N/A;   ABDOMINAL AORTOGRAM W/LOWER EXTREMITY N/A 01/28/2021   Procedure: ABDOMINAL AORTOGRAM W/LOWER EXTREMITY;  Surgeon: [name], MD;  Location: [place] [facility];  Service: Cardiovascular;  Laterality: N/A;   BACK SURGERY  2015   ACDF by Dr. [name]   COLONOSCOPY N/A 10/01/2014   Dr. [name]: multiple tubular adenomas removed, colonic diverticulosis, redundant colon. next tcs advised for 09/2017. PATIENT NEEDS PROPOFOL FOR FAILED CONSCIOUS SEDATION   CORONARY STENT INTERVENTION N/A 07/30/2016   Procedure: Coronary Stent  Intervention;  Surgeon: [name], MD;  Location: [place] [facility];  Service: Cardiovascular;  Laterality: N/A;   CORONARY STENT PLACEMENT  2000   By Dr. Olevia Perches   EP IMPLANTABLE DEVICE N/A 05/25/2016   Procedure: Loop Recorder Insertion;  Surgeon: [name], MD;  Location: [place] [facility];  Service: Cardiovascular;  Laterality: N/A;   ESOPHAGOGASTRODUODENOSCOPY     esophagus stretched remotely at [facility]   ESOPHAGOGASTRODUODENOSCOPY N/A 10/01/2014   Dr. [name]: patchy mottling/erythema and minimal polypoid appearance of gastric mucosa. bx with mild inlammation but no H.pylori   FEMORAL-POPLITEAL BYPASS GRAFT Left 11/22/2020   Procedure: LEFT FEMORAL-POPLITEAL BYPASS GRAFT;  Surgeon: [name], MD;  Location: [place];  Service: Vascular;  Laterality: Left;   HERNIA REPAIR  1245   umbilical   INSERTION OF ILIAC STENT Right 11/22/2020   Procedure: INSERTION OF ELUVIA STENT INTO RIGHT DISTAL SUPERFICIAL FEMORAL ARTERY;  Surgeon: [name], MD;  Location: [place];  Service: Vascular;  Laterality: Right;   LEFT HEART CATH AND CORONARY ANGIOGRAPHY N/A 07/30/2016   Procedure: Left Heart Cath and Coronary Angiography;  Surgeon: [name], MD;  Location: [place] [facility];  Service: Cardiovascular;  Laterality: N/A;   LEFT HEART CATH AND CORONARY ANGIOGRAPHY N/A 01/19/2018   Procedure: LEFT HEART CATH AND CORONARY ANGIOGRAPHY;  Surgeon: [name], MD;  Location: [place] [facility];  Service: Cardiovascular;  Laterality: N/A;   LEFT HEART CATH AND CORONARY ANGIOGRAPHY N/A 05/24/2020   Procedure: LEFT HEART CATH AND CORONARY ANGIOGRAPHY;  Surgeon: Burnell Blanks, MD;  Location: [place] [facility];  Service: Cardiovascular;  Laterality: N/A;   LESION REMOVAL     Lip and hand    LOWER EXTREMITY ANGIOGRAM Right 11/22/2020   Procedure: RIGHT LEG ANGIOGRAM;  Surgeon: [name], MD;  Location: [facility];  Service: Vascular;  Laterality: Right;   LOWER EXTREMITY ANGIOGRAPHY  N/A 04/18/2019   Procedure: LOWER EXTREMITY ANGIOGRAPHY;  Surgeon: [name], MD;  Location: [place] [facility];  Service: Cardiovascular;  Laterality: N/A;   NECK SURGERY     PERIPHERAL VASCULAR BALLOON ANGIOPLASTY  04/18/2019   Procedure: PERIPHERAL VASCULAR BALLOON ANGIOPLASTY;  Surgeon: [name], MD;  Location: [place] [facility];  Service: Cardiovascular;;   PERIPHERAL VASCULAR BALLOON ANGIOPLASTY Left 11/12/2020   Procedure: PERIPHERAL VASCULAR BALLOON ANGIOPLASTY;  Surgeon: [name], MD;  Location: [place] [facility];  Service: Cardiovascular;  Laterality: Left;  Failed PTA of superficial femoral artery.   PERIPHERAL VASCULAR INTERVENTION Right 03/21/2019   Procedure: PERIPHERAL VASCULAR INTERVENTION;  Surgeon: [name], MD;  Location: [place] [facility];  Service: Cardiovascular;  Laterality: Right;  superficial femoral    Current Medications: Outpatient Medications Prior to Visit  Medication Sig Dispense Refill  albuterol (VENTOLIN HFA) 108 (90 Base) MCG/ACT inhaler Inhale 2 puffs into the lungs every 6 (six) hours as needed for wheezing or shortness of breath. 8.5 g 3   apixaban (ELIQUIS) 5 MG TABS tablet Take 1 tablet (5 mg total) by mouth 2 (two) times daily. 180 tablet 1   Chlorphen-Phenyleph-ASA (ALKA-SELTZER PLUS COLD PO) Take 2 tablets by mouth daily as needed (allergies).     clopidogrel (PLAVIX) 75 MG tablet TAKE 1 TABLET BY MOUTH DAILY 30 tablet 3   clopidogrel (PLAVIX) 75 MG tablet Take 1 tablet by mouth once daily 90 tablet 0   dapagliflozin propanediol (FARXIGA) 5 MG TABS tablet Take 1 tablet (5 mg total) by mouth daily before breakfast. 90 tablet 2   DULoxetine (CYMBALTA) 30 MG capsule Take 1 capsule (30 mg total) by mouth daily. 90 capsule 1   ezetimibe (ZETIA) 10 MG tablet Take 1 tablet (10 mg total) by mouth daily. 90 tablet 3   fenofibrate 160 MG tablet Take 1 tablet (160 mg total) by mouth daily. 90 tablet 3   fluconazole (DIFLUCAN) 150  MG tablet TAKE 1 TABLET BY MOUTH EVERY 3 DAYS AS NEEDED 3 tablet 0   furosemide (LASIX) 20 MG tablet Take Two Times Daily for 7 Days then take Daily 97 tablet 3   gabapentin (NEURONTIN) 300 MG capsule Take 1 capsule (300 mg total) by mouth 3 (three) times daily. 270 capsule 1   HYDROcodone-acetaminophen (NORCO) 5-325 MG tablet Take 1-2 tablets by mouth every 6 (six) hours as needed for moderate pain. 20 tablet 0   insulin lispro (HUMALOG KWIKPEN) 100 UNIT/ML KwikPen Inject 15-20 Units into the skin 3 (three) times daily with meals. 30 mL 2   insulin NPH-regular Human (HUMULIN 70/30) (70-30) 100 UNIT/ML injection Inject 60 Units into the skin 2 (two) times daily with a meal. 40 mL 2   Insulin Syringe-Needle U-100 28G X 1/2" 1 ML MISC Use to give insulin five times daily Dx E11.42 500 each 3   isosorbide mononitrate (IMDUR) 30 MG 24 hr tablet Take 1 tablet (30 mg total) by mouth daily. 90 tablet 1   linaclotide (LINZESS) 72 MCG capsule Take 1 capsule (72 mcg total) by mouth daily as needed (constipation). 90 capsule 3   Menthol, Topical Analgesic, (BLUE-EMU MAXIMUM STRENGTH EX) Apply 1 application topically daily as needed (pain).     metoprolol succinate (TOPROL-XL) 50 MG 24 hr tablet Take 1 tablet (50 mg total) by mouth daily. Take with or immediately following a meal. 90 tablet 3   metoprolol tartrate (LOPRESSOR) 50 MG tablet Take 1 tablet (50 mg total) by mouth daily as needed (a-fib). 30 tablet 6   nitroGLYCERIN (NITROSTAT) 0.4 MG SL tablet DISSOLVE ONE TABLET UNDER THE TONGUE EVERY 5 MINUTES AS NEEDED FOR CHEST PAIN.  DO NOT EXCEED A TOTAL OF 3 DOSES IN 15 MINUTES Strength: 0.4 mg 25 tablet 0   pantoprazole (PROTONIX) 40 MG tablet Take 1 tablet (40 mg total) by mouth daily. 90 tablet 1   tirzepatide (MOUNJARO) 5 MG/0.5ML Pen Inject 5 mg into the skin once a week. 2 mL 1   vitamin B-12 (CYANOCOBALAMIN) 1000 MCG tablet Take 1,000 mcg by mouth daily.     Vitamin D, Ergocalciferol, (DRISDOL) 1.25 MG  (50000 UNIT) CAPS capsule Take 50,000 Units by mouth once a week.     No facility-administered medications prior to visit.     Allergies:   Shellfish allergy, Sulfa antibiotics, Ace inhibitors, Escitalopram, Evolocumab, Fenofibrate, Invokana [  canagliflozin], Lisinopril, Metformin and related, Pravastatin sodium, Milk-related compounds, Crestor [rosuvastatin], Horse-derived products, Lac bovis, Lexapro [escitalopram oxalate], Lipitor [atorvastatin], Livalo [pitavastatin], and Tape   Social History   Socioeconomic History   Marital status: Married    Spouse name: Belenda Cruise   Number of children: 2   Years of education: 12   Highest education level: 12th grade  Occupational History   Occupation: retired  Tobacco Use   Smoking status: Former    Packs/day: 0.25    Years: 51.00    Pack years: 12.75    Types: Cigarettes    Quit date: 08/14/2016    Years since quitting: 4.8   Smokeless tobacco: Never   Tobacco comments:    smokes  a pack a week  Vaping Use   Vaping Use: Never used  Substance and Sexual Activity   Alcohol use: No    Alcohol/week: 0.0 standard drinks   Drug use: No   Sexual activity: Yes  Other Topics Concern   Not on file  Social History Narrative   Lives with his wife.  Retired.  Unable to afford expensive medicines.     He has 2 children from previous marriage - they live in [place]       Caffeine: 1 cup of 1/2 caff coffee in AM, drinks more if at a restaurant    Social Determinants of Health   Financial Resource Strain: Medium Risk   Difficulty of Paying Living Expenses: Somewhat hard  Food Insecurity: No Food Insecurity   Worried About Charity fundraiser in the Last Year: Never true   Ran Out of Food in the Last Year: Never true  Transportation Needs: No Transportation Needs   Lack of Transportation (Medical): No   Lack of Transportation (Non-Medical): No  Physical Activity: Insufficiently Active   Days of Exercise per Week: 7 days   Minutes of  Exercise per Session: 10 min  Stress: No Stress Concern Present   Feeling of Stress : Only a little  Social Connections: Moderately Isolated   Frequency of Communication with Friends and Family: More than three times a week   Frequency of Social Gatherings with Friends and Family: Twice a week   Attends Religious Services: Never   Marine scientist or Organizations: No   Attends Music therapist: Never   Marital Status: Married     Family History:  The patient's family history includes Alzheimer's disease in his mother; Arthritis in his father and mother; Arthritis/Rheumatoid in his sister; Dementia in his maternal aunt and maternal uncle; Depression in his sister; Diabetes in his father and sister; Headache in his mother; Heart disease in his father and maternal uncle; Hyperlipidemia in his mother and sister; Hypertension in his mother and sister; Lung cancer in his mother; Stomach cancer in his paternal uncle; Stroke in his father and mother; Valvular heart disease in his father.   Review of Systems:   Please see the history of present illness.     General:  No chills, fever, night sweats or weight changes.  Cardiovascular:  No chest pain, dyspnea on exertion, edema, orthopnea,  paroxysmal nocturnal dyspnea. Positive for palpitations (now resolved).  Dermatological: No rash, lesions/masses Respiratory: No cough, dyspnea Urologic: No hematuria, dysuria Abdominal:   No nausea, vomiting, diarrhea, bright red blood per rectum, melena, or hematemesis Neurologic:  No visual changes, wkns, changes in mental status. All other systems reviewed and are otherwise negative except as noted above.   Physical Exam:  VS:  BP 140/70    Pulse 87    Ht 6\' 2"  (1.88 m)    Wt 276 lb (125.2 kg)    SpO2 90%    BMI 35.44 kg/m    Affect appropriate Chronically ill male  HEENT: normal Neck supple with no adenopathy JVP normal no bruits no thyromegaly Lungs clear with no wheezing and  good diaphragmatic motion Heart:  S1/S2 no murmur, no rub, gallop or click PMI normal  ILR under left pectoral area  Abdomen: benighn, BS positve, no tenderness, no AAA no bruit.  No HSM or HJR  Plus 2 edema left > right  Post left fem pop decreased pedal pulses    Wt Readings from Last 3 Encounters:  06/25/21 276 lb (125.2 kg)  06/24/21 (!) 306 lb (138.8 kg)  06/23/21 (!) 306 lb (138.8 kg)     Studies/Labs Reviewed:   EKG:  afib rate 91 07/18/20  Recent Labs: 11/24/2020: TSH 2.281 05/27/2021: ALT 16; BUN 23; Creatinine, Ser 1.57; Hemoglobin 11.7; Platelets 335; Potassium 4.9; Sodium 139   Lipid Panel    Component Value Date/Time   CHOL 173 11/24/2020 0211   CHOL 232 (H) 02/14/2020 1135   TRIG 308 (H) 11/24/2020 0211   HDL 25 (L) 11/24/2020 0211   HDL 15 (L) 02/14/2020 1135   CHOLHDL 6.9 11/24/2020 0211   VLDL 62 (H) 11/24/2020 0211   LDLCALC 86 11/24/2020 0211   LDLCALC 108 (H) 02/14/2020 1135   LDLDIRECT 172 (H) 11/08/2014 0908    Additional studies/ records that were reviewed today include:   Echocardiogram: 10/2019 IMPRESSIONS     1. Inferior basal akinesis . Left ventricular ejection fraction, by  estimation, is 50 to 55%. The left ventricle has low normal function. The  left ventricle demonstrates regional wall motion abnormalities (see  scoring diagram/findings for description).  There is mild left ventricular hypertrophy. Left ventricular diastolic  parameters were normal.   2. Right ventricular systolic function is normal. The right ventricular  size is normal.   3. The mitral valve is normal in structure. Trivial mitral valve  regurgitation. No evidence of mitral stenosis.   4. Calcification of the non coronary cusp . The aortic valve is  tricuspid. Aortic valve regurgitation is not visualized. Mild to moderate  aortic valve sclerosis/calcification is present, without any evidence of  aortic stenosis.   5. The inferior vena cava is dilated in size with  >50% respiratory  variability, suggesting right atrial pressure of 8 mmHg.   Cardiac Catheterization: 05/24/2020 Prox LAD lesion is 40% stenosed. Ost 1st Diag lesion is 40% stenosed. Ost 2nd Diag to 2nd Diag lesion is 100% stenosed. Mid Cx to Dist Cx lesion is 100% stenosed. Prox RCA to Mid RCA lesion is 90% stenosed. Prox RCA lesion is 99% stenosed. Ost 1st Mrg to 1st Mrg lesion is 80% stenosed. A drug-eluting stent was successfully placed using a STENT RESOLUTE ONYX [id number]. Post intervention, there is a 0% residual stenosis. Prox Cx lesion is 95% stenosed. A drug-eluting stent was successfully placed using a STENT RESOLUTE ONYX 3.0X22. Post intervention, there is a 0% residual stenosis.   1. Moderate mid LAD stenosis unchanged. Chronic occlusion Diagonal 1. Severe disease small diagonal 2.  2. Severe restenosis Circumflex/OM stents.  3. Successful PTCA/DES x 2 with overlapping Resolute Onyx DES in the Circumflex/OM 4. Chronic occlusion mid RCA in old stented segment. The distal vessel fills briskly from left to right collaterals.    Recommendations: Continue Plavix. Resume  anticoagulation tomorrow. Will continue ASA 81 mg daily until he is fully anti-coagulated then OK to stop ASA.      Plan:   In order of problems listed above:  1. Paroxysmal Atrial Fibrillation - Amiodarone d/c due to visual side effects Consider Multaq or Dofetilide for recurrence f/u GT continue eliquis Cr 1.57 GFR 47 on eliquis 5 bid   2. CAD - DES to RCA 2010 with f/u cath 2018 showing CTO RCA with collaterals.  Cath 05/24/20 with new stents placed to to circumflex and OM1 with occluded D2 and continued left to right collaterals to distal RCA and left to left collaterals to distal circumflex Continue Plavix needs eliquis for PAF   3. PAD - Followed by Vascular [facility] Failed angioplasty of left SFA for ulcer 11/12/20 with left fem-pop bypass 11/22/20 With hybrid stenting of right SFA  Last angio  01/28/21 with patent stents and bypass graft single vessel run off both legs no intervention f/u [facility]   4. HTN - actually low now d/c ARB    5. HLD - LDL had improved to 75 when checked earlier this month. He has been intolerant to statins and Repatha. Continues to participate in the CLEAR trial.   F/U GT  EP  F/U [name] F/U me in a year        Signed, Jenkins Rouge, MD  06/25/2021 9:11 AM    [facility]. [address] [place], [place] [address] Phone: [phone] Fax: [phone]

## 2021-06-23 ENCOUNTER — Other Ambulatory Visit: Payer: Self-pay | Admitting: Surgery

## 2021-06-23 ENCOUNTER — Encounter: Payer: Self-pay | Admitting: Family

## 2021-06-23 ENCOUNTER — Ambulatory Visit (INDEPENDENT_AMBULATORY_CARE_PROVIDER_SITE_OTHER): Payer: Medicare Other | Admitting: Family

## 2021-06-23 VITALS — BP 140/65 | HR 97 | Temp 97.3°F | Ht 74.0 in | Wt 306.0 lb

## 2021-06-23 DIAGNOSIS — M7032 Other bursitis of elbow, left elbow: Secondary | ICD-10-CM | POA: Diagnosis not present

## 2021-06-23 DIAGNOSIS — E1142 Type 2 diabetes mellitus with diabetic polyneuropathy: Secondary | ICD-10-CM

## 2021-06-23 DIAGNOSIS — E1159 Type 2 diabetes mellitus with other circulatory complications: Secondary | ICD-10-CM

## 2021-06-23 DIAGNOSIS — Z794 Long term (current) use of insulin: Secondary | ICD-10-CM | POA: Diagnosis not present

## 2021-06-23 DIAGNOSIS — I152 Hypertension secondary to endocrine disorders: Secondary | ICD-10-CM

## 2021-06-23 MED ORDER — METHYLPREDNISOLONE ACETATE 40 MG/ML IJ SUSP
40.0000 mg | Freq: Once | INTRAMUSCULAR | Status: AC
  Administered 2021-06-23: 40 mg via INTRAMUSCULAR

## 2021-06-23 MED ORDER — LIDOCAINE HCL (PF) 1 % IJ SOLN
1.0000 mL | Freq: Once | INTRAMUSCULAR | Status: AC
  Administered 2021-06-23: 1 mL via INTRADERMAL

## 2021-06-23 NOTE — Progress Notes (Signed)
Subjective:    Patient ID: [name] [name], male    DOB: May 14, 1951, 71 y.o.   MRN: [id number]  Chief Complaint  Patient presents with   Joint Swelling   Pt presents to the office today with left elbow swelling that started two weeks ago. He states this is slightly improved. He state he "poked a needle in it and it drained a little". Reports mild pain.  Diabetes He presents for his follow-up diabetic visit. He has type 2 diabetes mellitus. Associated symptoms include blurred vision and foot paresthesias. Symptoms are stable. Diabetic complications include heart disease, nephropathy and peripheral neuropathy. Risk factors for coronary artery disease include dyslipidemia, diabetes mellitus, male sex, hypertension and sedentary lifestyle. He is following a generally unhealthy diet. His overall blood glucose range is 130-140 mg/dl.  Hypertension This is a chronic problem. The current episode started more than 1 year ago. The problem has been waxing and waning since onset. The problem is uncontrolled. Associated symptoms include blurred vision, malaise/fatigue and shortness of breath. Pertinent negatives include no peripheral edema. The current treatment provides moderate improvement.     Review of Systems  Constitutional:  Positive for malaise/fatigue.  Eyes:  Positive for blurred vision.  Respiratory:  Positive for shortness of breath.   All other systems reviewed and are negative.     Objective:   Physical Exam Vitals reviewed.  Constitutional:      General: He is not in acute distress.    Appearance: He is well-developed. He is obese.  HENT:     Head: Normocephalic.  Eyes:     General:        Right eye: No discharge.        Left eye: No discharge.     Pupils: Pupils are equal, round, and reactive to light.  Neck:     Thyroid: No thyromegaly.  Cardiovascular:     Rate and Rhythm: Normal rate and regular rhythm.     Heart sounds: Normal heart sounds. No murmur heard. Pulmonary:      Effort: Pulmonary effort is normal. No respiratory distress.     Breath sounds: Normal breath sounds. No wheezing.  Abdominal:     General: Bowel sounds are normal. There is no distension.     Palpations: Abdomen is soft.     Tenderness: There is no abdominal tenderness.  Musculoskeletal:        General: No tenderness. Normal range of motion.     Cervical back: Normal range of motion and neck supple.     Comments: Left swelling of elbow , no redness or tenderness  Skin:    General: Skin is warm and dry.     Findings: No erythema or rash.  Neurological:     Mental Status: He is alert and oriented to person, place, and time.     Cranial Nerves: No cranial nerve deficit.     Deep Tendon Reflexes: Reflexes are normal and symmetric.  Psychiatric:        Behavior: Behavior normal.        Thought Content: Thought content normal.        Judgment: Judgment normal.   Joint Injection/Arthrocentesis  Date/Time: 06/23/2021 1:01 PM Performed by: [name], FNP Authorized by: [name], FNP  Indications: joint swelling  Body area: elbow Local anesthesia used: yes  Anesthesia: Local anesthesia used: yes Local Anesthetic: lidocaine 1% without epinephrine  Sedation: Patient sedated: no  Preparation: Patient was prepped and draped in the  usual sterile fashion. Needle size: 20 G Ultrasound guidance: no Aspirate: bloody Aspirate amount: 5 mL Methylprednisolone amount: 1 mg Lidocaine 1% amount: 1 mL Patient tolerance: patient tolerated the procedure well with no immediate complications      BP 725/36    Pulse 97    Temp (!) 97.3 F (36.3 C) (Temporal)    Ht 6\' 2"  (1.88 m)    Wt (!) 306 lb (138.8 kg)    SpO2 99%    BMI 39.29 kg/m      Assessment & Plan:  [name] [name] comes in today with chief complaint of Joint Swelling   Diagnosis and orders addressed:  1. Bursitis of left elbow, unspecified bursa Avoid injury Report any s/s of infection  2. Type 2  diabetes mellitus with diabetic polyneuropathy, with long-term current use of insulin (HCC) Continue medications  - PR DIABETIC CUSTOM MOLDED SHOE  3. Hypertension associated with diabetes [facility])    Health Maintenance reviewed Diet and exercise encouraged  Follow up plan: Keep chronic follow up   [name], FNP

## 2021-06-23 NOTE — Patient Instructions (Signed)
Elbow Bursitis °Bursitis is swelling and pain at the tip of the elbow. This happens when fluid builds up in a sac under the skin (bursa). This may also be called olecranon bursitis. °What are the causes? °Elbow bursitis may be caused by: °Elbow injury, such as falling onto the elbow. °Leaning on hard surfaces for long periods of time. °Infection from an injury that breaks the skin near the elbow. °A bone growth (spur) that forms at the tip of the elbow. °A medical condition that causes inflammation, such as gout or rheumatoid arthritis. °Sometimes the cause is not known. °What are the signs or symptoms? °The first sign of elbow bursitis is usually swelling at the tip of the elbow. This can grow to be about the size of a golf ball. Swelling may start suddenly or develop gradually. Other symptoms may include: °Pain when bending or leaning on the elbow. °Not being able to move the elbow normally. °If bursitis is caused by an infection, you may have: °Redness, warmth, and tenderness of the elbow. °Drainage of pus from the swollen area over the elbow, if the skin breaks open. °How is this diagnosed? °This condition may be diagnosed based on: °Your symptoms and medical history. °Any recent injuries you have had. °A physical exam. °X-rays to check for a bone spur or fracture. °Draining fluid from the bursa to test it for infection. °Blood tests to rule out gout or rheumatoid arthritis. °How is this treated? °Treatment for elbow bursitis depends on the cause. Treatment may include: °Medicines. These may include: °Over-the-counter medicines to relieve pain and inflammation. °Antibiotic medicines. °Injections of anti-inflammatory medicines (steroids). °Draining fluid from the bursa. °Wrapping your elbow with a bandage. °Wearing elbow pads. °If these treatments do not help, you may need surgery to remove the bursa. °Follow these instructions at home: °Medicines °Take over-the-counter and prescription medicines only as told by  your health care provider. °If you were prescribed an antibiotic medicine, take it as told by your health care provider. Do not stop taking the antibiotic even if you start to feel better. °Managing pain, stiffness, and swelling ° °If directed, put ice on your elbow: °Put ice in a plastic bag. °Place a towel between your skin and the bag. °Leave the ice on for 20 minutes, 2-3 times a day. °If your bursitis is caused by an injury, rest your elbow and wear your bandage as told by your health care provider. °Use elbow pads or elbow wraps to cushion your elbow as needed. °General instructions °Avoid any activities that cause elbow pain. Ask your health care provider what activities are safe for you. °Keep all follow-up visits as told by your health care provider. This is important. °Contact a health care provider if you have: °A fever. °Symptoms that do not get better with treatment. °Pain or swelling that: °Gets worse. °Goes away and then comes back. °Pus draining from your elbow. °Get help right away if you have: °Trouble moving your arm, hand, or fingers. °Summary °Elbow bursitis is inflammation of the fluid-filled sac (bursa) between the tip of your elbow bone (olecranon) and your skin. °Treatment for elbow bursitis depends on the cause. It may include medicines to relieve pain and inflammation, antibiotic medicines, and draining fluid from your elbow. °Contact a health care provider if your symptoms do not get better with treatment, or if your symptoms go away and then come back. °This information is not intended to replace advice given to you by your health care provider. Make sure   you discuss any questions you have with your health care provider. °Document Revised: 10/11/2019 Document Reviewed: 11/08/2019 °Elsevier Patient Education © 2022 [employer] ° °

## 2021-06-23 NOTE — Addendum Note (Signed)
Addended by: Brynda Peon F on: 06/23/2021 01:26 PM   Modules accepted: Orders

## 2021-06-24 ENCOUNTER — Encounter (HOSPITAL_COMMUNITY)
Admission: RE | Admit: 2021-06-24 | Discharge: 2021-06-24 | Disposition: A | Discharge status: Home / Self Care | Payer: Medicare Other | Source: Ambulatory Visit | Attending: Nephrology | Admitting: Nephrology

## 2021-06-24 ENCOUNTER — Encounter (HOSPITAL_COMMUNITY): Payer: Self-pay

## 2021-06-24 DIAGNOSIS — D509 Iron deficiency anemia, unspecified: Secondary | ICD-10-CM | POA: Insufficient documentation

## 2021-06-24 MED ORDER — SODIUM CHLORIDE 0.9 % IV SOLN
510.0000 mg | Freq: Once | INTRAVENOUS | Status: AC
  Administered 2021-06-24: 510 mg via INTRAVENOUS
  Filled 2021-06-24: qty 510

## 2021-06-24 MED ORDER — SODIUM CHLORIDE 0.9 % IV SOLN: Freq: Once | INTRAVENOUS | Status: AC

## 2021-06-25 ENCOUNTER — Other Ambulatory Visit: Payer: Self-pay

## 2021-06-25 ENCOUNTER — Encounter: Payer: Self-pay | Admitting: Cardiovascular Disease

## 2021-06-25 ENCOUNTER — Encounter: Payer: Self-pay | Admitting: Internal Medicine

## 2021-06-25 ENCOUNTER — Ambulatory Visit: Payer: Medicare Other | Admitting: Cardiovascular Disease

## 2021-06-25 VITALS — BP 140/70 | HR 87 | Ht 74.0 in | Wt 276.0 lb

## 2021-06-25 DIAGNOSIS — M7989 Other specified soft tissue disorders: Secondary | ICD-10-CM | POA: Diagnosis not present

## 2021-06-25 DIAGNOSIS — I739 Peripheral vascular disease, unspecified: Secondary | ICD-10-CM

## 2021-06-25 DIAGNOSIS — I480 Paroxysmal atrial fibrillation: Secondary | ICD-10-CM

## 2021-06-25 DIAGNOSIS — I2510 Atherosclerotic heart disease of native coronary artery without angina pectoris: Secondary | ICD-10-CM

## 2021-06-25 NOTE — Patient Instructions (Signed)
Medication Instructions:  Your physician recommends that you continue on your current medications as directed. Please refer to the Current Medication list given to you today.  *If you need a refill on your cardiac medications before your next appointment, please call your pharmacy*   Lab Work: NONE   If you have labs (blood work) drawn today and your tests are completely normal, you will receive your results only by: . MyChart Message (if you have MyChart) OR . A paper copy in the mail If you have any lab test that is abnormal or we need to change your treatment, we will call you to review the results.   Testing/Procedures: NONE    Follow-Up: At [facility], you and your health needs are our priority.  As part of our continuing mission to provide you with exceptional heart care, we have created designated Provider Care Teams.  These Care Teams include your primary Cardiologist (physician) and Advanced Practice Providers (APPs -  Physician Assistants and Nurse Practitioners) who all work together to provide you with the care you need, when you need it.  We recommend signing up for the patient portal called "MyChart".  Sign up information is provided on this After Visit Summary.  MyChart is used to connect with patients for Virtual Visits (Telemedicine).  Patients are able to view lab/test results, encounter notes, upcoming appointments, etc.  Non-urgent messages can be sent to your provider as well.   To learn more about what you can do with MyChart, go to [email or web address].    Your next appointment:   1 year(s)  The format for your next appointment:   In Person  Provider:   Peter Nishan, MD   Other Instructions Thank you for choosing [facility]!    

## 2021-06-29 ENCOUNTER — Other Ambulatory Visit: Payer: Self-pay | Admitting: Family

## 2021-07-01 ENCOUNTER — Encounter (HOSPITAL_COMMUNITY): Payer: Self-pay

## 2021-07-01 ENCOUNTER — Encounter (HOSPITAL_COMMUNITY)
Admission: RE | Admit: 2021-07-01 | Discharge: 2021-07-01 | Disposition: A | Discharge status: Home / Self Care | Payer: Medicare Other | Source: Ambulatory Visit | Attending: Nephrology | Admitting: Nephrology

## 2021-07-01 DIAGNOSIS — D509 Iron deficiency anemia, unspecified: Secondary | ICD-10-CM | POA: Diagnosis not present

## 2021-07-01 LAB — HM DIABETES EYE EXAM

## 2021-07-01 MED ORDER — SODIUM CHLORIDE 0.9 % IV SOLN: INTRAVENOUS | Status: DC

## 2021-07-01 MED ORDER — SODIUM CHLORIDE 0.9 % IV SOLN
510.0000 mg | Freq: Once | INTRAVENOUS | Status: AC
  Administered 2021-07-01: 510 mg via INTRAVENOUS
  Filled 2021-07-01: qty 510

## 2021-07-06 ENCOUNTER — Other Ambulatory Visit: Payer: Self-pay | Admitting: Family

## 2021-07-07 ENCOUNTER — Other Ambulatory Visit: Payer: Self-pay | Admitting: Family

## 2021-07-13 ENCOUNTER — Other Ambulatory Visit: Payer: Self-pay | Admitting: Family

## 2021-07-16 ENCOUNTER — Other Ambulatory Visit: Payer: Self-pay

## 2021-07-16 DIAGNOSIS — I779 Disorder of arteries and arterioles, unspecified: Secondary | ICD-10-CM

## 2021-07-20 ENCOUNTER — Other Ambulatory Visit: Payer: Self-pay | Admitting: Family

## 2021-07-25 ENCOUNTER — Inpatient Hospital Stay (HOSPITAL_COMMUNITY)
Admission: EM | Admit: 2021-07-25 | Discharge: 2021-07-29 | DRG: 315 | Disposition: A | Discharge status: Home / Self Care | Payer: Medicare Other | Attending: Internal Medicine | Admitting: Internal Medicine

## 2021-07-25 ENCOUNTER — Encounter: Payer: Self-pay | Admitting: Family Medicine

## 2021-07-25 ENCOUNTER — Emergency Department (HOSPITAL_COMMUNITY): Payer: Medicare Other

## 2021-07-25 ENCOUNTER — Ambulatory Visit (INDEPENDENT_AMBULATORY_CARE_PROVIDER_SITE_OTHER): Payer: Medicare Other | Admitting: Family Medicine

## 2021-07-25 ENCOUNTER — Encounter (HOSPITAL_COMMUNITY): Payer: Self-pay | Admitting: Emergency Medicine

## 2021-07-25 ENCOUNTER — Inpatient Hospital Stay (HOSPITAL_COMMUNITY): Payer: Medicare Other

## 2021-07-25 ENCOUNTER — Other Ambulatory Visit: Payer: Self-pay

## 2021-07-25 VITALS — BP 96/55 | HR 47 | Temp 98.0°F | Ht 74.0 in

## 2021-07-25 DIAGNOSIS — Z955 Presence of coronary angioplasty implant and graft: Secondary | ICD-10-CM | POA: Diagnosis not present

## 2021-07-25 DIAGNOSIS — R7881 Bacteremia: Secondary | ICD-10-CM | POA: Diagnosis not present

## 2021-07-25 DIAGNOSIS — E1142 Type 2 diabetes mellitus with diabetic polyneuropathy: Secondary | ICD-10-CM | POA: Diagnosis present

## 2021-07-25 DIAGNOSIS — L0390 Cellulitis, unspecified: Secondary | ICD-10-CM | POA: Diagnosis not present

## 2021-07-25 DIAGNOSIS — R600 Localized edema: Secondary | ICD-10-CM | POA: Diagnosis not present

## 2021-07-25 DIAGNOSIS — E1140 Type 2 diabetes mellitus with diabetic neuropathy, unspecified: Secondary | ICD-10-CM | POA: Diagnosis present

## 2021-07-25 DIAGNOSIS — M869 Osteomyelitis, unspecified: Secondary | ICD-10-CM | POA: Diagnosis not present

## 2021-07-25 DIAGNOSIS — E871 Hypo-osmolality and hyponatremia: Secondary | ICD-10-CM | POA: Diagnosis not present

## 2021-07-25 DIAGNOSIS — N1831 Chronic kidney disease, stage 3a: Secondary | ICD-10-CM | POA: Diagnosis not present

## 2021-07-25 DIAGNOSIS — T82898A Other specified complication of vascular prosthetic devices, implants and grafts, initial encounter: Secondary | ICD-10-CM | POA: Diagnosis not present

## 2021-07-25 DIAGNOSIS — Z823 Family history of stroke: Secondary | ICD-10-CM

## 2021-07-25 DIAGNOSIS — Z87891 Personal history of nicotine dependence: Secondary | ICD-10-CM

## 2021-07-25 DIAGNOSIS — M79672 Pain in left foot: Secondary | ICD-10-CM | POA: Diagnosis not present

## 2021-07-25 DIAGNOSIS — Z882 Allergy status to sulfonamides status: Secondary | ICD-10-CM

## 2021-07-25 DIAGNOSIS — Z79899 Other long term (current) drug therapy: Secondary | ICD-10-CM

## 2021-07-25 DIAGNOSIS — Z95828 Presence of other vascular implants and grafts: Secondary | ICD-10-CM

## 2021-07-25 DIAGNOSIS — R5381 Other malaise: Secondary | ICD-10-CM | POA: Diagnosis not present

## 2021-07-25 DIAGNOSIS — Z820 Family history of epilepsy and other diseases of the nervous system: Secondary | ICD-10-CM

## 2021-07-25 DIAGNOSIS — R6510 Systemic inflammatory response syndrome (SIRS) of non-infectious origin without acute organ dysfunction: Secondary | ICD-10-CM

## 2021-07-25 DIAGNOSIS — R300 Dysuria: Secondary | ICD-10-CM

## 2021-07-25 DIAGNOSIS — M7989 Other specified soft tissue disorders: Secondary | ICD-10-CM | POA: Diagnosis not present

## 2021-07-25 DIAGNOSIS — E1122 Type 2 diabetes mellitus with diabetic chronic kidney disease: Secondary | ICD-10-CM | POA: Diagnosis not present

## 2021-07-25 DIAGNOSIS — I5042 Chronic combined systolic (congestive) and diastolic (congestive) heart failure: Secondary | ICD-10-CM | POA: Diagnosis present

## 2021-07-25 DIAGNOSIS — E669 Obesity, unspecified: Secondary | ICD-10-CM | POA: Diagnosis present

## 2021-07-25 DIAGNOSIS — M1990 Unspecified osteoarthritis, unspecified site: Secondary | ICD-10-CM | POA: Diagnosis not present

## 2021-07-25 DIAGNOSIS — S81802A Unspecified open wound, left lower leg, initial encounter: Secondary | ICD-10-CM | POA: Diagnosis present

## 2021-07-25 DIAGNOSIS — E1152 Type 2 diabetes mellitus with diabetic peripheral angiopathy with gangrene: Secondary | ICD-10-CM | POA: Diagnosis not present

## 2021-07-25 DIAGNOSIS — N179 Acute kidney failure, unspecified: Secondary | ICD-10-CM | POA: Diagnosis not present

## 2021-07-25 DIAGNOSIS — I959 Hypotension, unspecified: Secondary | ICD-10-CM | POA: Diagnosis present

## 2021-07-25 DIAGNOSIS — Z800 Family history of malignant neoplasm of digestive organs: Secondary | ICD-10-CM

## 2021-07-25 DIAGNOSIS — I252 Old myocardial infarction: Secondary | ICD-10-CM | POA: Diagnosis not present

## 2021-07-25 DIAGNOSIS — Z7902 Long term (current) use of antithrombotics/antiplatelets: Secondary | ICD-10-CM

## 2021-07-25 DIAGNOSIS — Z8261 Family history of arthritis: Secondary | ICD-10-CM

## 2021-07-25 DIAGNOSIS — L97419 Non-pressure chronic ulcer of right heel and midfoot with unspecified severity: Secondary | ICD-10-CM | POA: Diagnosis present

## 2021-07-25 DIAGNOSIS — I779 Disorder of arteries and arterioles, unspecified: Secondary | ICD-10-CM | POA: Diagnosis present

## 2021-07-25 DIAGNOSIS — E11621 Type 2 diabetes mellitus with foot ulcer: Secondary | ICD-10-CM | POA: Diagnosis present

## 2021-07-25 DIAGNOSIS — E1169 Type 2 diabetes mellitus with other specified complication: Secondary | ICD-10-CM | POA: Diagnosis present

## 2021-07-25 DIAGNOSIS — Z794 Long term (current) use of insulin: Secondary | ICD-10-CM

## 2021-07-25 DIAGNOSIS — Z818 Family history of other mental and behavioral disorders: Secondary | ICD-10-CM

## 2021-07-25 DIAGNOSIS — Z801 Family history of malignant neoplasm of trachea, bronchus and lung: Secondary | ICD-10-CM

## 2021-07-25 DIAGNOSIS — Z20822 Contact with and (suspected) exposure to covid-19: Secondary | ICD-10-CM | POA: Diagnosis not present

## 2021-07-25 DIAGNOSIS — E66812 Obesity, class 2: Secondary | ICD-10-CM | POA: Diagnosis present

## 2021-07-25 DIAGNOSIS — F419 Anxiety disorder, unspecified: Secondary | ICD-10-CM | POA: Diagnosis not present

## 2021-07-25 DIAGNOSIS — Z91013 Allergy to seafood: Secondary | ICD-10-CM

## 2021-07-25 DIAGNOSIS — Z7901 Long term (current) use of anticoagulants: Secondary | ICD-10-CM | POA: Diagnosis not present

## 2021-07-25 DIAGNOSIS — Z833 Family history of diabetes mellitus: Secondary | ICD-10-CM

## 2021-07-25 DIAGNOSIS — S81802S Unspecified open wound, left lower leg, sequela: Principal | ICD-10-CM

## 2021-07-25 DIAGNOSIS — E1159 Type 2 diabetes mellitus with other circulatory complications: Secondary | ICD-10-CM

## 2021-07-25 DIAGNOSIS — I480 Paroxysmal atrial fibrillation: Secondary | ICD-10-CM | POA: Diagnosis not present

## 2021-07-25 DIAGNOSIS — I152 Hypertension secondary to endocrine disorders: Secondary | ICD-10-CM | POA: Diagnosis not present

## 2021-07-25 DIAGNOSIS — I517 Cardiomegaly: Secondary | ICD-10-CM | POA: Diagnosis not present

## 2021-07-25 DIAGNOSIS — L97429 Non-pressure chronic ulcer of left heel and midfoot with unspecified severity: Secondary | ICD-10-CM | POA: Diagnosis present

## 2021-07-25 DIAGNOSIS — B955 Unspecified streptococcus as the cause of diseases classified elsewhere: Secondary | ICD-10-CM | POA: Diagnosis not present

## 2021-07-25 DIAGNOSIS — I2510 Atherosclerotic heart disease of native coronary artery without angina pectoris: Secondary | ICD-10-CM | POA: Diagnosis not present

## 2021-07-25 DIAGNOSIS — E875 Hyperkalemia: Secondary | ICD-10-CM | POA: Diagnosis present

## 2021-07-25 DIAGNOSIS — Z8249 Family history of ischemic heart disease and other diseases of the circulatory system: Secondary | ICD-10-CM

## 2021-07-25 DIAGNOSIS — R0602 Shortness of breath: Secondary | ICD-10-CM | POA: Diagnosis not present

## 2021-07-25 DIAGNOSIS — I96 Gangrene, not elsewhere classified: Secondary | ICD-10-CM | POA: Diagnosis present

## 2021-07-25 DIAGNOSIS — I509 Heart failure, unspecified: Secondary | ICD-10-CM | POA: Diagnosis not present

## 2021-07-25 DIAGNOSIS — E785 Hyperlipidemia, unspecified: Secondary | ICD-10-CM | POA: Diagnosis not present

## 2021-07-25 DIAGNOSIS — Z7984 Long term (current) use of oral hypoglycemic drugs: Secondary | ICD-10-CM

## 2021-07-25 DIAGNOSIS — Z83438 Family history of other disorder of lipoprotein metabolism and other lipidemia: Secondary | ICD-10-CM

## 2021-07-25 DIAGNOSIS — R6889 Other general symptoms and signs: Secondary | ICD-10-CM | POA: Diagnosis not present

## 2021-07-25 DIAGNOSIS — Y832 Surgical operation with anastomosis, bypass or graft as the cause of abnormal reaction of the patient, or of later complication, without mention of misadventure at the time of the procedure: Secondary | ICD-10-CM | POA: Diagnosis present

## 2021-07-25 DIAGNOSIS — K219 Gastro-esophageal reflux disease without esophagitis: Secondary | ICD-10-CM | POA: Diagnosis present

## 2021-07-25 DIAGNOSIS — I2583 Coronary atherosclerosis due to lipid rich plaque: Secondary | ICD-10-CM | POA: Diagnosis not present

## 2021-07-25 DIAGNOSIS — Z6837 Body mass index (BMI) 37.0-37.9, adult: Secondary | ICD-10-CM

## 2021-07-25 DIAGNOSIS — E119 Type 2 diabetes mellitus without complications: Secondary | ICD-10-CM

## 2021-07-25 DIAGNOSIS — I70235 Atherosclerosis of native arteries of right leg with ulceration of other part of foot: Secondary | ICD-10-CM | POA: Diagnosis not present

## 2021-07-25 DIAGNOSIS — Z743 Need for continuous supervision: Secondary | ICD-10-CM | POA: Diagnosis not present

## 2021-07-25 DIAGNOSIS — R0600 Dyspnea, unspecified: Secondary | ICD-10-CM

## 2021-07-25 DIAGNOSIS — I70245 Atherosclerosis of native arteries of left leg with ulceration of other part of foot: Secondary | ICD-10-CM | POA: Diagnosis not present

## 2021-07-25 DIAGNOSIS — Z888 Allergy status to other drugs, medicaments and biological substances status: Secondary | ICD-10-CM

## 2021-07-25 DIAGNOSIS — Z9862 Peripheral vascular angioplasty status: Secondary | ICD-10-CM

## 2021-07-25 DIAGNOSIS — M79671 Pain in right foot: Secondary | ICD-10-CM | POA: Diagnosis not present

## 2021-07-25 LAB — CBC WITH DIFFERENTIAL/PLATELET
Abs Immature Granulocytes: 0.05 10*3/uL (ref 0.00–0.07)
Basophils Absolute: 0.1 10*3/uL (ref 0.0–0.1)
Basophils Relative: 1 %
Eosinophils Absolute: 0.1 10*3/uL (ref 0.0–0.5)
Eosinophils Relative: 1 %
HCT: 50.2 % (ref 39.0–52.0)
Hemoglobin: 14.8 g/dL (ref 13.0–17.0)
Immature Granulocytes: 1 %
Lymphocytes Relative: 13 %
Lymphs Abs: 1.1 10*3/uL (ref 0.7–4.0)
MCH: 24.8 pg — ABNORMAL LOW (ref 26.0–34.0)
MCHC: 29.5 g/dL — ABNORMAL LOW (ref 30.0–36.0)
MCV: 84.1 fL (ref 80.0–100.0)
Monocytes Absolute: 0.8 10*3/uL (ref 0.1–1.0)
Monocytes Relative: 9 %
Neutro Abs: 6.5 10*3/uL (ref 1.7–7.7)
Neutrophils Relative %: 75 %
Platelets: 271 10*3/uL (ref 150–400)
RBC: 5.97 MIL/uL — ABNORMAL HIGH (ref 4.22–5.81)
RDW: 23.4 % — ABNORMAL HIGH (ref 11.5–15.5)
WBC: 8.5 10*3/uL (ref 4.0–10.5)
nRBC: 0 % (ref 0.0–0.2)

## 2021-07-25 LAB — URINALYSIS, ROUTINE W REFLEX MICROSCOPIC
Bacteria, UA: NONE SEEN
Bilirubin Urine: NEGATIVE
Bilirubin, UA: NEGATIVE
Glucose, UA: >=500 mg/dL — AB
Ketones, UA: NEGATIVE
Ketones, ur: NEGATIVE mg/dL
Leukocytes,UA: NEGATIVE
Leukocytes,Ua: NEGATIVE
Nitrite, UA: NEGATIVE
Nitrite: NEGATIVE
Protein, ur: 30 mg/dL — AB
Specific Gravity, UA: 1.015 (ref 1.005–1.030)
Specific Gravity, Urine: 1.007 (ref 1.005–1.030)
Urobilinogen, Ur: >8 mg/dL — ABNORMAL HIGH (ref 0.2–1.0)
pH, UA: 7 (ref 5.0–7.5)
pH: 7 (ref 5.0–8.0)

## 2021-07-25 LAB — MICROSCOPIC EXAMINATION
Bacteria, UA: NONE SEEN
Renal Epithel, UA: NONE SEEN /hpf
WBC, UA: NONE SEEN /hpf (ref 0–5)

## 2021-07-25 LAB — COMPREHENSIVE METABOLIC PANEL
ALT: 22 U/L (ref 0–44)
AST: 29 U/L (ref 15–41)
Albumin: 4.4 g/dL (ref 3.5–5.0)
Alkaline Phosphatase: 41 U/L (ref 38–126)
Anion gap: 12 (ref 5–15)
BUN: 22 mg/dL (ref 8–23)
CO2: 27 mmol/L (ref 22–32)
Calcium: 10.1 mg/dL (ref 8.9–10.3)
Chloride: 95 mmol/L — ABNORMAL LOW (ref 98–111)
Creatinine, Ser: 1.57 mg/dL — ABNORMAL HIGH (ref 0.61–1.24)
GFR, Estimated: 47 mL/min — ABNORMAL LOW (ref >60)
Glucose, Bld: 136 mg/dL — ABNORMAL HIGH (ref 70–99)
Potassium: 4.2 mmol/L (ref 3.5–5.1)
Sodium: 134 mmol/L — ABNORMAL LOW (ref 135–145)
Total Bilirubin: 0.9 mg/dL (ref 0.3–1.2)
Total Protein: 8.6 g/dL — ABNORMAL HIGH (ref 6.5–8.1)

## 2021-07-25 LAB — TROPONIN I (HIGH SENSITIVITY)
Troponin I (High Sensitivity): 52 ng/L — ABNORMAL HIGH (ref <18)
Troponin I (High Sensitivity): 58 ng/L — ABNORMAL HIGH (ref <18)

## 2021-07-25 LAB — LACTIC ACID, PLASMA
Lactic Acid, Venous: 1.9 mmol/L (ref 0.5–1.9)
Lactic Acid, Venous: 1.9 mmol/L (ref 0.5–1.9)

## 2021-07-25 LAB — PREALBUMIN: Prealbumin: 16.6 mg/dL — ABNORMAL LOW (ref 18–38)

## 2021-07-25 LAB — SEDIMENTATION RATE: Sed Rate: 48 mm/hr — ABNORMAL HIGH (ref 0–16)

## 2021-07-25 LAB — GLUCOSE, CAPILLARY: Glucose-Capillary: 137 mg/dL — ABNORMAL HIGH (ref 70–99)

## 2021-07-25 LAB — RESP PANEL BY RT-PCR (FLU A&B, COVID) ARPGX2
Influenza A by PCR: NEGATIVE
Influenza B by PCR: NEGATIVE
SARS Coronavirus 2 by RT PCR: NEGATIVE

## 2021-07-25 LAB — C-REACTIVE PROTEIN: CRP: 17.8 mg/dL — ABNORMAL HIGH (ref <1.0)

## 2021-07-25 MED ORDER — ISOSORBIDE MONONITRATE ER 30 MG PO TB24
30.0000 mg | ORAL_TABLET | Freq: Every day | ORAL | Status: DC
  Administered 2021-07-26 – 2021-07-28 (×3): 30 mg via ORAL
  Filled 2021-07-25 (×3): qty 1

## 2021-07-25 MED ORDER — METOPROLOL SUCCINATE ER 50 MG PO TB24
50.0000 mg | ORAL_TABLET | Freq: Every day | ORAL | Status: DC
  Administered 2021-07-26 – 2021-07-28 (×3): 50 mg via ORAL
  Filled 2021-07-25 (×3): qty 1

## 2021-07-25 MED ORDER — FENOFIBRATE 160 MG PO TABS
160.0000 mg | ORAL_TABLET | Freq: Every day | ORAL | Status: DC
  Administered 2021-07-26 – 2021-07-28 (×3): 160 mg via ORAL
  Filled 2021-07-25 (×3): qty 1

## 2021-07-25 MED ORDER — HYDROCODONE-ACETAMINOPHEN 10-325 MG PO TABS
1.0000 | ORAL_TABLET | Freq: Three times a day (TID) | ORAL | Status: DC | PRN
  Administered 2021-07-25 – 2021-07-26 (×2): 1 via ORAL
  Filled 2021-07-25 (×2): qty 1

## 2021-07-25 MED ORDER — DAPAGLIFLOZIN PROPANEDIOL 5 MG PO TABS
5.0000 mg | ORAL_TABLET | Freq: Every day | ORAL | Status: DC
  Administered 2021-07-26: 5 mg via ORAL
  Filled 2021-07-25 (×2): qty 1

## 2021-07-25 MED ORDER — METOPROLOL TARTRATE 50 MG PO TABS: 50.0000 mg | ORAL_TABLET | Freq: Once | ORAL | Status: DC

## 2021-07-25 MED ORDER — METOPROLOL TARTRATE 5 MG/5ML IV SOLN
5.0000 mg | Freq: Once | INTRAVENOUS | Status: AC
  Administered 2021-07-25: 5 mg via INTRAVENOUS
  Filled 2021-07-25: qty 5

## 2021-07-25 MED ORDER — APIXABAN 5 MG PO TABS
5.0000 mg | ORAL_TABLET | Freq: Two times a day (BID) | ORAL | Status: DC
Start: 2021-07-25 — End: 2021-07-26
  Administered 2021-07-25 – 2021-07-26 (×2): 5 mg via ORAL
  Filled 2021-07-25 (×2): qty 1

## 2021-07-25 MED ORDER — METRONIDAZOLE 500 MG/100ML IV SOLN
500.0000 mg | Freq: Two times a day (BID) | INTRAVENOUS | Status: DC
  Administered 2021-07-25 – 2021-07-26 (×2): 500 mg via INTRAVENOUS
  Filled 2021-07-25 (×2): qty 100

## 2021-07-25 MED ORDER — INSULIN ASPART PROT & ASPART (70-30 MIX) 100 UNIT/ML ~~LOC~~ SUSP
60.0000 [IU] | Freq: Two times a day (BID) | SUBCUTANEOUS | Status: DC
  Administered 2021-07-26 – 2021-07-28 (×6): 60 [IU] via SUBCUTANEOUS
  Filled 2021-07-25: qty 10

## 2021-07-25 MED ORDER — LINACLOTIDE 72 MCG PO CAPS
72.0000 ug | ORAL_CAPSULE | Freq: Every day | ORAL | Status: DC | PRN
  Administered 2021-07-26: 72 ug via ORAL
  Filled 2021-07-25: qty 1

## 2021-07-25 MED ORDER — INSULIN ASPART 100 UNIT/ML IJ SOLN
0.0000 [IU] | Freq: Every day | INTRAMUSCULAR | Status: DC
  Administered 2021-07-28: 2 [IU] via SUBCUTANEOUS

## 2021-07-25 MED ORDER — ONDANSETRON HCL 4 MG/2ML IJ SOLN
4.0000 mg | Freq: Four times a day (QID) | INTRAMUSCULAR | Status: DC | PRN
  Administered 2021-07-27: 4 mg via INTRAVENOUS
  Filled 2021-07-25: qty 2

## 2021-07-25 MED ORDER — DULOXETINE HCL 30 MG PO CPEP
30.0000 mg | ORAL_CAPSULE | Freq: Every day | ORAL | Status: DC
  Administered 2021-07-26 – 2021-07-28 (×3): 30 mg via ORAL
  Filled 2021-07-25 (×3): qty 1

## 2021-07-25 MED ORDER — VANCOMYCIN HCL 1500 MG/300ML IV SOLN: 1500.0000 mg | INTRAVENOUS | Status: DC

## 2021-07-25 MED ORDER — GABAPENTIN 300 MG PO CAPS
300.0000 mg | ORAL_CAPSULE | Freq: Two times a day (BID) | ORAL | Status: DC
  Administered 2021-07-25 – 2021-07-28 (×7): 300 mg via ORAL
  Filled 2021-07-25 (×7): qty 1

## 2021-07-25 MED ORDER — METOPROLOL TARTRATE 25 MG PO TABS: 25.0000 mg | ORAL_TABLET | Freq: Once | ORAL | Status: DC

## 2021-07-25 MED ORDER — VANCOMYCIN HCL 2000 MG/400ML IV SOLN
2000.0000 mg | Freq: Once | INTRAVENOUS | Status: AC
  Administered 2021-07-25: 2000 mg via INTRAVENOUS
  Filled 2021-07-25: qty 400

## 2021-07-25 MED ORDER — PANTOPRAZOLE SODIUM 40 MG PO TBEC
40.0000 mg | DELAYED_RELEASE_TABLET | Freq: Every day | ORAL | Status: DC
  Administered 2021-07-26 – 2021-07-28 (×3): 40 mg via ORAL
  Filled 2021-07-25 (×3): qty 1

## 2021-07-25 MED ORDER — ALBUTEROL SULFATE HFA 108 (90 BASE) MCG/ACT IN AERS: 2.0000 | INHALATION_SPRAY | Freq: Four times a day (QID) | RESPIRATORY_TRACT | Status: DC | PRN

## 2021-07-25 MED ORDER — FUROSEMIDE 20 MG PO TABS
20.0000 mg | ORAL_TABLET | Freq: Every day | ORAL | Status: DC
  Administered 2021-07-25: 20 mg via ORAL
  Filled 2021-07-25: qty 1

## 2021-07-25 MED ORDER — CLOPIDOGREL BISULFATE 75 MG PO TABS
75.0000 mg | ORAL_TABLET | Freq: Every day | ORAL | Status: DC
  Administered 2021-07-25 – 2021-07-28 (×4): 75 mg via ORAL
  Filled 2021-07-25 (×4): qty 1

## 2021-07-25 MED ORDER — ONDANSETRON HCL 4 MG PO TABS: 4.0000 mg | ORAL_TABLET | Freq: Four times a day (QID) | ORAL | Status: DC | PRN

## 2021-07-25 MED ORDER — SODIUM CHLORIDE 0.9 % IV SOLN
2.0000 g | INTRAVENOUS | Status: DC
  Administered 2021-07-25: 2 g via INTRAVENOUS
  Filled 2021-07-25: qty 20

## 2021-07-25 MED ORDER — ALBUTEROL SULFATE (2.5 MG/3ML) 0.083% IN NEBU
2.5000 mg | INHALATION_SOLUTION | Freq: Four times a day (QID) | RESPIRATORY_TRACT | Status: DC | PRN
Start: 2021-07-25 — End: 2021-07-29

## 2021-07-25 MED ORDER — EZETIMIBE 10 MG PO TABS
10.0000 mg | ORAL_TABLET | Freq: Every day | ORAL | Status: DC
  Administered 2021-07-26 – 2021-07-28 (×3): 10 mg via ORAL
  Filled 2021-07-25 (×3): qty 1

## 2021-07-25 MED ORDER — GADOBUTROL 1 MMOL/ML IV SOLN
10.0000 mL | Freq: Once | INTRAVENOUS | Status: AC | PRN
  Administered 2021-07-25: 10 mL via INTRAVENOUS

## 2021-07-25 MED ORDER — INSULIN ASPART 100 UNIT/ML IJ SOLN
0.0000 [IU] | Freq: Three times a day (TID) | INTRAMUSCULAR | Status: DC
  Administered 2021-07-26 – 2021-07-28 (×7): 4 [IU] via SUBCUTANEOUS
  Administered 2021-07-28: 11 [IU] via SUBCUTANEOUS
  Administered 2021-07-28 – 2021-07-29 (×2): 4 [IU] via SUBCUTANEOUS

## 2021-07-25 NOTE — Progress Notes (Signed)
Pharmacy Antibiotic Note ? ?[name] [name] is a 71 y.o. male admitted on 07/25/2021 with  osteomyeletis .  Pharmacy has been consulted for vancomycin dosing. ? ?Plan: ?Vancomycin 2000 mg IV x 1 dose. ?Vancomycin 1500 mg IV every 24 hours. ?Monitor labs, c/s, and vanco level as inidicated. ? ?Height: '6\' 2"'$  (188 cm) ?Weight: 131.5 kg (290 lb) ?IBW/kg (Calculated) : 82.2 ? ?Temp (24hrs), Avg:97.8 ?F (36.6 ?C), Min:97.6 ?F (36.4 ?C), Max:98 ?F (36.7 ?C) ? ?Recent Labs  ?Lab 07/25/21 ?1359 07/25/21 ?1400  ?WBC 8.5  --   ?CREATININE 1.57*  --   ?LATICACIDVEN  --  1.9  ?  ?Estimated Creatinine Clearance: 63.1 mL/min (A) (by C-G formula based on SCr of 1.57 mg/dL (H)).   ? ?Allergies  ?Allergen Reactions  ? Shellfish Allergy Anaphylaxis and Other (See Comments)  ?  Tongue swelling, hives ?  ? Sulfa Antibiotics Anaphylaxis and Rash  ?  Tongue swelling, hives  ? Ace Inhibitors Other (See Comments) and Cough  ?  CKD, renal failure   ? Escitalopram Other (See Comments)  ?  Buzzing in ears,headache, felt like a zombie ?  ? Evolocumab Other (See Comments)  ?  Myalgias, flu like sx ?  ? Fenofibrate Other (See Comments)  ?  Body aches - pt currently taking isnt sure if its causing any pain ?  ? Invokana [Canagliflozin] Other (See Comments)  ?  Syncope / dehydration  ? Lisinopril Cough  ? Metformin And Related Itching  ? Pravastatin Sodium Other (See Comments)  ?  myalgias  ? Milk-Related Compounds   ?  Ties stomach in knots   ? Crestor [Rosuvastatin] Other (See Comments)  ?  Myalgias ?  ? Horse-Derived Products Rash  ? Lac Bovis Nausea Only  ?  Ties stomach in knots  ?  ? Lexapro [Escitalopram Oxalate] Other (See Comments)  ?  Buzzing in ears,headache, felt like a zombie  ? Lipitor [Atorvastatin] Other (See Comments)  ?  myalgias  ? Livalo [Pitavastatin] Other (See Comments)  ?  Myalgias ?  ? Tape Rash  ? ? ?Antimicrobials this admission: ?Vanco 3/10 >> ?CTX 3/10 >> ?Flagyl 3/10 >> ? ?Microbiology results: ?3/10 BCx: pending  ?3/10  MRSA PCR: pending ? ?Thank you for allowing pharmacy to be a part of this patient?s care. ? ?Margot Ables, PharmD ?Clinical Pharmacist ?07/25/2021 4:23 PM ? ? ?

## 2021-07-25 NOTE — ED Notes (Signed)
Attempted to call report, RN stated she would call me back  ?

## 2021-07-25 NOTE — ED Notes (Signed)
Attempted to call report again, unable to reach a nurse at this time  ?

## 2021-07-25 NOTE — Consult Note (Signed)
Hospital Consult    Reason for Consult: Bilateral lower extremity wounds with known PAD Referring Physician: Forestine Na MRN #:  [id number]  History of Present Illness: This is a 71 y.o. male with history of atrial fibrillation on eliquis, coronary artery disease, diabetes, congestive heart failure and peripheral arterial disease that vascular was called from [facility] ED for further evaluation of bilateral lower extremity wounds.  He was referred to the ED today for work-up of sepsis from his PCP office.  He is well-known to our practice and has been followed by Dr. [name].  He had a right SFA stent 03/21/2019 by Dr. [name] with single-vessel peroneal runoff.  He then had a left common femoral to above-knee popliteal bypass with vein on 11/22/2020 by Dr. [name] and had an additional right lower extremity SFA stent placed at the same time.  Most recently on 01/28/2021 had bilateral lower extremity arteriogram with Dr. [name] showed a patent bypass in the left leg with single vessel peroneal runoff and occluded trifurcation on the right.  Dr. [name] last saw him in the office in December 2022 with plans for 20-monthfollow-up.  States he has numbness in his feet all the time that is chronic.  He walks with a cane and falls a lot.  Scheduled to see Dr. BTrula Sladeon Monday.  Both feet are motor intact.  Past Medical History:  Diagnosis Date   Anxiety    Arthritis    Atrial fibrillation (HCC)    CAD (coronary artery disease)    a. 2010: DES to CTO of RCA. EF 55% b. 07/2016: cath showing total occlusion within previously placed RCA stent (collaterals present), severe stenosis along LCx and OM1 (treated with 2 overlapping DES). c. repeat cath in 01/2018 showing patent stents along LCx and OM with CTO of D2, CTO of distal LCx, and CTO of RCA with collaterals present overall unchanged since 2018 with medical management recom   Cellulitis and abscess rt groin    Complication of anesthesia    " I woke up  during a colonoscopy "      Depression    Diabetes mellitus    [place]    Disorders of iron metabolism    Dysrhythmia    GERD (gastroesophageal reflux disease)    Hyperlipidemia    Hypertension    Low serum testosterone level    Medically noncompliant    Myocardial infarction ([facility]    05-23-20    Past Surgical History:  Procedure Laterality Date   ABDOMINAL AORTOGRAM W/LOWER EXTREMITY N/A 03/21/2019   Procedure: ABDOMINAL AORTOGRAM W/LOWER EXTREMITY;  Surgeon: BSerafina Mitchell MD;  [facility];  Service: Cardiovascular;  Laterality: N/A;   ABDOMINAL AORTOGRAM W/LOWER EXTREMITY N/A 11/12/2020   Procedure: ABDOMINAL AORTOGRAM W/LOWER EXTREMITY;  Surgeon: BSerafina Mitchell MD;  [place] [facility];  Service: Cardiovascular;  Laterality: N/A;   ABDOMINAL AORTOGRAM W/LOWER EXTREMITY N/A 01/28/2021   Procedure: ABDOMINAL AORTOGRAM W/LOWER EXTREMITY;  Surgeon: BSerafina Mitchell MD;  [place] [facility];  Service: Cardiovascular;  Laterality: N/A;   BACK SURGERY  2015   ACDF by Dr. RCarloyn Manner  COLONOSCOPY N/A 10/01/2014   Dr. RGala Romney multiple tubular adenomas removed, colonic diverticulosis, redundant colon. next tcs advised for 09/2017. PATIENT NEEDS PROPOFOL FOR FAILED CONSCIOUS SEDATION   CORONARY STENT INTERVENTION N/A 07/30/2016   Procedure: Coronary Stent Intervention;  Surgeon: MSherren Mocha MD;  [place] [facility];  Service: Cardiovascular;  Laterality:  N/A;   CORONARY STENT PLACEMENT  2000   By Dr. Olevia Perches   EP IMPLANTABLE DEVICE N/A 05/25/2016   Procedure: Loop Recorder Insertion;  Surgeon: [name], MD;  Location: [place] [facility];  Service: Cardiovascular;  Laterality: N/A;   ESOPHAGOGASTRODUODENOSCOPY     esophagus stretched remotely at [facility]   ESOPHAGOGASTRODUODENOSCOPY N/A 10/01/2014   Dr. [name]: patchy mottling/erythema and minimal polypoid appearance of gastric mucosa. bx with mild inlammation but no H.pylori    FEMORAL-POPLITEAL BYPASS GRAFT Left 11/22/2020   Procedure: LEFT FEMORAL-POPLITEAL BYPASS GRAFT;  Surgeon: [name], MD;  Location: [place];  Service: Vascular;  Laterality: Left;   HERNIA REPAIR  [id number]   umbilical   INSERTION OF ILIAC STENT Right 11/22/2020   Procedure: INSERTION OF ELUVIA STENT INTO RIGHT DISTAL SUPERFICIAL FEMORAL ARTERY;  Surgeon: [name], MD;  Location: [place];  Service: Vascular;  Laterality: Right;   LEFT HEART CATH AND CORONARY ANGIOGRAPHY N/A 07/30/2016   Procedure: Left Heart Cath and Coronary Angiography;  Surgeon: [name], MD;  Location: [place] [facility];  Service: Cardiovascular;  Laterality: N/A;   LEFT HEART CATH AND CORONARY ANGIOGRAPHY N/A 01/19/2018   Procedure: LEFT HEART CATH AND CORONARY ANGIOGRAPHY;  Surgeon: [name], MD;  Location: [place] [facility];  Service: Cardiovascular;  Laterality: N/A;   LEFT HEART CATH AND CORONARY ANGIOGRAPHY N/A 05/24/2020   Procedure: LEFT HEART CATH AND CORONARY ANGIOGRAPHY;  Surgeon: Burnell Blanks, MD;  Location: [place] [facility];  Service: Cardiovascular;  Laterality: N/A;   LESION REMOVAL     Lip and hand    LOWER EXTREMITY ANGIOGRAM Right 11/22/2020   Procedure: RIGHT LEG ANGIOGRAM;  Surgeon: [name], MD;  [facility];  Service: Vascular;  Laterality: Right;   LOWER EXTREMITY ANGIOGRAPHY N/A 04/18/2019   Procedure: LOWER EXTREMITY ANGIOGRAPHY;  Surgeon: [name], MD;  Location: [place] [facility];  Service: Cardiovascular;  Laterality: N/A;   NECK SURGERY     PERIPHERAL VASCULAR BALLOON ANGIOPLASTY  04/18/2019   Procedure: PERIPHERAL VASCULAR BALLOON ANGIOPLASTY;  Surgeon: [name], MD;  Location: [place] [facility];  Service: Cardiovascular;;   PERIPHERAL VASCULAR BALLOON ANGIOPLASTY Left 11/12/2020   Procedure: PERIPHERAL VASCULAR BALLOON ANGIOPLASTY;  Surgeon: [name], MD;  Location: [place] [facility];  Service: Cardiovascular;  Laterality: Left;   Failed PTA of superficial femoral artery.   PERIPHERAL VASCULAR INTERVENTION Right 03/21/2019   Procedure: PERIPHERAL VASCULAR INTERVENTION;  Surgeon: [name], MD;  Location: [place] [facility];  Service: Cardiovascular;  Laterality: Right;  superficial femoral    Allergies  Allergen Reactions   Shellfish Allergy Anaphylaxis and Other (See Comments)    Tongue swelling, hives    Sulfa Antibiotics Anaphylaxis and Rash    Tongue swelling, hives   Ace Inhibitors Other (See Comments) and Cough    CKD, renal failure    Escitalopram Other (See Comments)    Buzzing in ears,headache, felt like a zombie    Evolocumab Other (See Comments)    Myalgias, flu like sx    Fenofibrate Other (See Comments)    Body aches - pt currently taking isnt sure if its causing any pain    Invokana [Canagliflozin] Other (See Comments)    Syncope / dehydration   Lisinopril Cough   Metformin And Related Itching   Pravastatin Sodium Other (See Comments)    myalgias   Milk-Related Compounds     Ties  stomach in knots    Crestor [Rosuvastatin] Other (See Comments)    Myalgias    Horse-Derived Products Rash   Lac Bovis Nausea Only    Ties stomach in knots     Lexapro [Escitalopram Oxalate] Other (See Comments)    Buzzing in ears,headache, felt like a zombie   Lipitor [Atorvastatin] Other (See Comments)    myalgias   Livalo [Pitavastatin] Other (See Comments)    Myalgias    Tape Rash    Prior to Admission medications   Medication Sig Start Date End Date Taking? Authorizing Provider  albuterol (VENTOLIN HFA) 108 (90 Base) MCG/ACT inhaler Inhale 2 puffs into the lungs every 6 (six) hours as needed for wheezing or shortness of breath. 08/18/19  Yes Josue Hector, MD  apixaban (ELIQUIS) 5 MG TABS tablet Take 1 tablet (5 mg total) by mouth 2 (two) times daily. 04/29/21  Yes Hawks, Christy A, FNP  Chlorphen-Phenyleph-ASA (ALKA-SELTZER PLUS COLD PO) Take 2 tablets by mouth daily as needed (allergies).    Yes [provider]  clopidogrel (PLAVIX) 75 MG tablet TAKE 1 TABLET BY MOUTH DAILY Patient taking differently: Take 75 mg by mouth daily. 06/23/21  Yes Waynetta Sandy, MD  dapagliflozin propanediol (FARXIGA) 5 MG TABS tablet Take 1 tablet (5 mg total) by mouth daily before breakfast. 04/29/21  Yes Hawks, Christy A, FNP  DULoxetine (CYMBALTA) 30 MG capsule Take 1 capsule (30 mg total) by mouth daily. 04/29/21  Yes Hawks, Christy A, FNP  ezetimibe (ZETIA) 10 MG tablet Take 1 tablet (10 mg total) by mouth daily. 04/28/21  Yes Josue Hector, MD  fenofibrate 160 MG tablet Take 1 tablet (160 mg total) by mouth daily. 04/29/21  Yes Hawks, Christy A, FNP  furosemide (LASIX) 20 MG tablet Take Two Times Daily for 7 Days then take Daily Patient taking differently: Take 20 mg by mouth daily as needed for fluid. Take Two Times Daily for 7 Days then take Daily 04/28/21  Yes Josue Hector, MD  gabapentin (NEURONTIN) 300 MG capsule Take 1 capsule (300 mg total) by mouth 3 (three) times daily. Patient taking differently: Take 300 mg by mouth 2 (two) times daily. 04/29/21  Yes Hawks, Christy A, FNP  HYDROcodone-acetaminophen (NORCO) 10-325 MG tablet Take 1 tablet by mouth every 8 (eight) hours as needed for moderate pain. 03/25/21  Yes [provider]  insulin lispro (HUMALOG KWIKPEN) 100 UNIT/ML KwikPen Inject 15-20 Units into the skin 3 (three) times daily with meals. 05/07/21  Yes Hawks, Christy A, FNP  insulin NPH-regular Human (HUMULIN 70/30) (70-30) 100 UNIT/ML injection Inject 60 Units into the skin 2 (two) times daily with a meal. 05/14/21  Yes Hawks, Christy A, FNP  isosorbide mononitrate (IMDUR) 30 MG 24 hr tablet Take 1 tablet (30 mg total) by mouth daily. 04/29/21  Yes Hawks, Christy A, FNP  linaclotide (LINZESS) 72 MCG capsule Take 1 capsule (72 mcg total) by mouth daily as needed (constipation). 07/26/19  Yes Hawks, Christy A, FNP  Menthol, Topical Analgesic, (BLUE-EMU  MAXIMUM STRENGTH EX) Apply 1 application topically daily as needed (pain).   Yes [provider]  metoprolol succinate (TOPROL-XL) 50 MG 24 hr tablet Take 1 tablet (50 mg total) by mouth daily. Take with or immediately following a meal. 04/28/21  Yes Josue Hector, MD  MOUNJARO 5 MG/0.5ML Pen INJECT '5MG'$  SUBCUTANEOUSLY ONCE A WEEK Patient taking differently: Inject 5 mg into the skin once a week. 06/30/21  Yes Hawks, [place] A,  FNP  nitroGLYCERIN (NITROSTAT) 0.4 MG SL tablet DISSOLVE ONE TABLET UNDER THE TONGUE EVERY 5 MINUTES AS NEEDED FOR CHEST PAIN.  DO NOT EXCEED A TOTAL OF 3 DOSES IN 15 MINUTES Strength: 0.4 mg Patient taking differently: Place 0.4 mg under the tongue every 5 (five) minutes as needed for chest pain. 04/28/21  Yes Josue Hector, MD  pantoprazole (PROTONIX) 40 MG tablet Take 1 tablet (40 mg total) by mouth daily. 04/29/21  Yes Hawks, Christy A, FNP  vitamin B-12 (CYANOCOBALAMIN) 1000 MCG tablet Take 1,000 mcg by mouth daily.   Yes [provider]  Insulin Syringe-Needle U-100 28G X 1/2" 1 ML MISC Use to give insulin five times daily Dx E11.42 05/07/21   [name] A, FNP  metoprolol tartrate (LOPRESSOR) 50 MG tablet Take 1 tablet (50 mg total) by mouth daily as needed (a-fib). 04/28/21   Josue Hector, MD    Social History   Socioeconomic History   Marital status: Married    Spouse name: Belenda Cruise   Number of children: 2   Years of education: 12   Highest education level: 12th grade  Occupational History   Occupation: retired  Tobacco Use   Smoking status: Former    Packs/day: 0.25    Years: 51.00    Pack years: 12.75    Types: Cigarettes    Quit date: 08/14/2016    Years since quitting: 4.9   Smokeless tobacco: Never   Tobacco comments:    smokes  a pack a week  Vaping Use   Vaping Use: Never used  Substance and Sexual Activity   Alcohol use: No    Alcohol/week: 0.0 standard drinks   Drug use: No   Sexual activity: Yes  Other  Topics Concern   Not on file  Social History Narrative   Lives with his wife.  Retired.  Unable to afford expensive medicines.     He has 2 children from previous marriage - they live in [place]       Caffeine: 1 cup of 1/2 caff coffee in AM, drinks more if at a restaurant    Social Determinants of Health   Financial Resource Strain: Medium Risk   Difficulty of Paying Living Expenses: Somewhat hard  Food Insecurity: No Food Insecurity   Worried About Charity fundraiser in the Last Year: Never true   Ran Out of Food in the Last Year: Never true  Transportation Needs: No Transportation Needs   Lack of Transportation (Medical): No   Lack of Transportation (Non-Medical): No  Physical Activity: Insufficiently Active   Days of Exercise per Week: 7 days   Minutes of Exercise per Session: 10 min  Stress: No Stress Concern Present   Feeling of Stress : Only a little  Social Connections: Moderately Isolated   Frequency of Communication with Friends and Family: More than three times a week   Frequency of Social Gatherings with Friends and Family: Twice a week   Attends Religious Services: Never   Marine scientist or Organizations: No   Attends Music therapist: Never   Marital Status: Married  Human resources officer Violence: Not At Risk   Fear of Current or Ex-Partner: No   Emotionally Abused: No   Physically Abused: No   Sexually Abused: No     Family History  Problem Relation Age of Onset   Diabetes Father    Valvular heart disease Father    Arthritis Father    Heart disease Father  Stroke Father    Alzheimer's disease Mother    Hyperlipidemia Mother    Hypertension Mother    Arthritis Mother    Lung cancer Mother    Stroke Mother    Headache Mother    Arthritis/Rheumatoid Sister    Diabetes Sister    Hypertension Sister    Hyperlipidemia Sister    Depression Sister    Dementia Maternal Aunt    Dementia Maternal Uncle    Heart disease Maternal  Uncle    Stomach cancer Paternal Uncle    Colon cancer Neg Hx    Liver disease Neg Hx     ROS: '[x]'$  Positive   '[ ]'$  Negative   '[ ]'$  All sytems reviewed and are negative  Cardiovascular: '[]'$  chest pain/pressure '[]'$  palpitations '[]'$  SOB lying flat '[]'$  DOE '[]'$  pain in legs while walking '[]'$  pain in legs at rest '[]'$  pain in legs at night '[]'$  non-healing ulcers '[]'$  hx of DVT '[]'$  swelling in legs  Pulmonary: '[]'$  productive cough '[]'$  asthma/wheezing '[]'$  home O2  Neurologic: '[]'$  weakness in '[]'$  arms '[]'$  legs '[x]'$  numbness in '[]'$  arms '[x]'$  legs '[]'$  hx of CVA '[]'$  mini stroke '[]'$ difficulty speaking or slurred speech '[]'$  temporary loss of vision in one eye '[]'$  dizziness  Hematologic: '[]'$  hx of cancer '[]'$  bleeding problems '[]'$  problems with blood clotting easily  Endocrine:   '[]'$  diabetes '[]'$  thyroid disease  GI '[]'$  vomiting blood '[]'$  blood in stool  GU: '[]'$  CKD/renal failure '[]'$  HD--'[]'$  M/W/F or '[]'$  T/T/S '[]'$  burning with urination '[]'$  blood in urine  Psychiatric: '[]'$  anxiety '[]'$  depression  Musculoskeletal: '[]'$  arthritis '[]'$  joint pain  Integumentary: '[]'$  rashes '[]'$  ulcers  Constitutional: '[]'$  fever '[]'$  chills   Physical Examination  Vitals:   07/25/21 1545 07/25/21 1630  BP:  (!) 143/81  Pulse: 97 (!) 104  Resp: 18 (!) 24  Temp:    SpO2: 96% 97%   Body mass index is 37.23 kg/m.  General:  NAD Gait: Not observed HENT: WNL, normocephalic Pulmonary: normal non-labored breathing Cardiac: regular, without  Murmurs, rubs or gallops Abdomen:  soft, NT/ND, obese Vascular Exam/Pulses: Sitting up in bed eating dinner with large pannus and hard to appreciate femoral pulses Both feet are warm and grossly motor intact  Ischemic changes to the right first and second toe and the left first second and third toes Left heel ulcer Musculoskeletal: no muscle wasting or atrophy  Neurologic: A&O X 3; Appropriate Affect ; SENSATION: normal; MOTOR FUNCTION:  moving all extremities equally. Speech is  fluent/normal         CBC    Component Value Date/Time   WBC 8.5 07/25/2021 1359   RBC 5.97 (H) 07/25/2021 1359   HGB 14.8 07/25/2021 1359   HGB 11.7 (L) 05/27/2021 0938   HCT 50.2 07/25/2021 1359   HCT 40.8 05/27/2021 0938   PLT 271 07/25/2021 1359   PLT 335 05/27/2021 0938   MCV 84.1 07/25/2021 1359   MCV 74 (L) 05/27/2021 0938   MCH 24.8 (L) 07/25/2021 1359   MCHC 29.5 (L) 07/25/2021 1359   RDW 23.4 (H) 07/25/2021 1359   RDW 15.4 05/27/2021 0938   LYMPHSABS 1.1 07/25/2021 1359   LYMPHSABS 1.6 05/27/2021 0938   MONOABS 0.8 07/25/2021 1359   EOSABS 0.1 07/25/2021 1359   EOSABS 0.1 05/27/2021 0938   BASOSABS 0.1 07/25/2021 1359   BASOSABS 0.1 05/27/2021 0938    BMET    Component Value Date/Time   NA 134 (L) 07/25/2021  1359   NA 139 05/27/2021 0938   K 4.2 07/25/2021 1359   CL 95 (L) 07/25/2021 1359   CO2 27 07/25/2021 1359   GLUCOSE 136 (H) 07/25/2021 1359   BUN 22 07/25/2021 1359   BUN 23 05/27/2021 0938   CREATININE 1.57 (H) 07/25/2021 1359   CALCIUM 10.1 07/25/2021 1359   GFRNONAA 47 (L) 07/25/2021 1359   GFRAA 54 (L) 06/20/2020 1529    COAGS: Lab Results  Component Value Date   INR 1.1 11/20/2020   INR 1.3 (H) 05/23/2020   INR 0.99 10/25/2011     Non-Invasive Vascular Imaging:    ABIs and left leg arterial duplex are pending   ASSESSMENT/PLAN: This is a 71 y.o. male with multiple comorbidities that vascular surgery was consulted from [facility] ED for evaluation of bilateral lower extremity wounds.  Patient is well-known to our practice and has been followed by Dr. [name] and has had multiple right SFA stents with a known occluded tibial trifurcation and only peroneal runoff (based on most recent angiogram) and also previous left common femoral to above-knee popliteal bypass with peroneal runoff.    His wounds are pictured above and overall look dry.  He has had progression of wounds particularly on the right foot.  I have ordered a left leg  arterial duplex to ensure his bypass remains patent and also updated ABIs.  He was scheduled to see Dr. [name] on Monday in the office.  Vascular will follow.  May ultimately require additional intervention while here for his chronic PAD.  Marty Heck, MD [facility] Office: [place]

## 2021-07-25 NOTE — H&P (Signed)
History and Physical    Patient: [name] [name] WJX:[id number] DOB: 01/18/1951 DOA: 07/25/2021 DOS: the patient was seen and examined on 07/25/2021 PCP: Junie Spencer, FNP  Patient coming from: Home  Chief Complaint:  Chief Complaint  Patient presents with   Hypotension   HPI: [name] [name] is a 71 y.o. male with medical history significant of paroxysmal atrial fibrillation, h/o NSTEMI with coronary artery disease with multiple stents, diabetes, diastolic heart failure, peripheral artery occlusive disease with recent femoropopliteal bypass with multiple skin ulcerations and necrotic areas on feet bilaterally.  Patient sent to the emergency department from family medicine office due to concerns for sepsis.  Patient has had severe fatigue and fever and rigors over the past 3 days with worsening lower extremity pain left greater than right.  He has multiple ulcerations that have been weeping and oozing.  Patient was mildly hypotensive on arrival with tachycardia.  This improved with IV fluids.  Patient was started on vancomycin.  X-rays obtained showed no x-Yunior evidence of osteomyelitis.  Review of Systems: As mentioned in the history of present illness. All other systems reviewed and are negative. Past Medical History:  Diagnosis Date   Anxiety    Arthritis    Atrial fibrillation (HCC)    CAD (coronary artery disease)    a. 2010: DES to CTO of RCA. EF 55% b. 07/2016: cath showing total occlusion within previously placed RCA stent (collaterals present), severe stenosis along LCx and OM1 (treated with 2 overlapping DES). c. repeat cath in 01/2018 showing patent stents along LCx and OM with CTO of D2, CTO of distal LCx, and CTO of RCA with collaterals present overall unchanged since 2018 with medical management recom   Cellulitis and abscess rt groin    Complication of anesthesia    " I woke up during a colonoscopy "      Depression    Diabetes mellitus    Diastolic CHF (HCC)    Disorders of  iron metabolism    Dysrhythmia    GERD (gastroesophageal reflux disease)    Hyperlipidemia    Hypertension    Low serum testosterone level    Medically noncompliant    Myocardial infarction [facility])    05-23-20   Past Surgical History:  Procedure Laterality Date   ABDOMINAL AORTOGRAM W/LOWER EXTREMITY N/A 03/21/2019   Procedure: ABDOMINAL AORTOGRAM W/LOWER EXTREMITY;  Surgeon: Nada Libman, MD;  Location: [facility] INVASIVE CV LAB;  Service: Cardiovascular;  Laterality: N/A;   ABDOMINAL AORTOGRAM W/LOWER EXTREMITY N/A 11/12/2020   Procedure: ABDOMINAL AORTOGRAM W/LOWER EXTREMITY;  Surgeon: Nada Libman, MD;  Location: [facility];  Service: Cardiovascular;  Laterality: N/A;   ABDOMINAL AORTOGRAM W/LOWER EXTREMITY N/A 01/28/2021   Procedure: ABDOMINAL AORTOGRAM W/LOWER EXTREMITY;  Surgeon: Nada Libman, MD;  Location: [facility];  Service: Cardiovascular;  Laterality: N/A;   BACK SURGERY  2015   ACDF by Dr. Channing Mutters   COLONOSCOPY N/A 10/01/2014   Dr. Jena Gauss: multiple tubular adenomas removed, colonic diverticulosis, redundant colon. next tcs advised for 09/2017. PATIENT NEEDS PROPOFOL FOR FAILED CONSCIOUS SEDATION   CORONARY STENT INTERVENTION N/A 07/30/2016   Procedure: Coronary Stent Intervention;  Surgeon: Tonny Bollman, MD;  Location: [facility] INVASIVE CV LAB;  Service: Cardiovascular;  Laterality: N/A;   CORONARY STENT PLACEMENT  2000   By Dr. Juanda Chance   EP IMPLANTABLE DEVICE N/A 05/25/2016   Procedure: Loop Recorder Insertion;  Surgeon: Marinus Maw, MD;  Location: [facility] INVASIVE CV LAB;  Service: Cardiovascular;  Laterality: N/A;   ESOPHAGOGASTRODUODENOSCOPY     esophagus stretched remotely at [facility]   ESOPHAGOGASTRODUODENOSCOPY N/A 10/01/2014   Dr. Jena Gauss: patchy mottling/erythema and minimal polypoid appearance of gastric mucosa. bx with mild inlammation but no H.pylori   FEMORAL-POPLITEAL BYPASS GRAFT Left 11/22/2020   Procedure: LEFT FEMORAL-POPLITEAL BYPASS GRAFT;  Surgeon: Nada Libman, MD;  Location: [facility] OR;  Service: Vascular;  Laterality: Left;   HERNIA REPAIR  2000   umbilical   INSERTION OF ILIAC STENT Right 11/22/2020   Procedure: INSERTION OF ELUVIA STENT INTO RIGHT DISTAL SUPERFICIAL FEMORAL ARTERY;  Surgeon: Nada Libman, MD;  Location: [facility] OR;  Service: Vascular;  Laterality: Right;   LEFT HEART CATH AND CORONARY ANGIOGRAPHY N/A 07/30/2016   Procedure: Left Heart Cath and Coronary Angiography;  Surgeon: Tonny Bollman, MD;  Location: [facility] INVASIVE CV LAB;  Service: Cardiovascular;  Laterality: N/A;   LEFT HEART CATH AND CORONARY ANGIOGRAPHY N/A 01/19/2018   Procedure: LEFT HEART CATH AND CORONARY ANGIOGRAPHY;  Surgeon: Lennette Bihari, MD;  Location: [facility];  Service: Cardiovascular;  Laterality: N/A;   LEFT HEART CATH AND CORONARY ANGIOGRAPHY N/A 05/24/2020   Procedure: LEFT HEART CATH AND CORONARY ANGIOGRAPHY;  Surgeon: Kathleene Hazel, MD;  Location: [facility];  Service: Cardiovascular;  Laterality: N/A;   LESION REMOVAL     Lip and hand    LOWER EXTREMITY ANGIOGRAM Right 11/22/2020   Procedure: RIGHT LEG ANGIOGRAM;  Surgeon: Nada Libman, MD;  Location: [facility];  Service: Vascular;  Laterality: Right;   LOWER EXTREMITY ANGIOGRAPHY N/A 04/18/2019   Procedure: LOWER EXTREMITY ANGIOGRAPHY;  Surgeon: Nada Libman, MD;  [facility]: [facility];  Service: Cardiovascular;  Laterality: N/A;   NECK SURGERY     PERIPHERAL VASCULAR BALLOON ANGIOPLASTY  04/18/2019   Procedure: PERIPHERAL VASCULAR BALLOON ANGIOPLASTY;  Surgeon: Nada Libman, MD;  [facility]: [facility];  Service: Cardiovascular;;   PERIPHERAL VASCULAR BALLOON ANGIOPLASTY Left 11/12/2020   Procedure: PERIPHERAL VASCULAR BALLOON ANGIOPLASTY;  Surgeon: Nada Libman, MD;  [facility]: [facility];  Service: Cardiovascular;  Laterality: Left;  Failed PTA of superficial femoral artery.   PERIPHERAL VASCULAR INTERVENTION Right 03/21/2019   Procedure: PERIPHERAL  VASCULAR INTERVENTION;  Surgeon: Nada Libman, MD;  Location: [facility];  Service: Cardiovascular;  Laterality: Right;  superficial femoral   Social History:  reports that he quit smoking about 4 years ago. His smoking use included cigarettes. He has a 12.75 pack-year smoking history. He has never used smokeless tobacco. He reports that he does not drink alcohol and does not use drugs.  Allergies  Allergen Reactions   Shellfish Allergy Anaphylaxis and Other (See Comments)    Tongue swelling, hives    Sulfa Antibiotics Anaphylaxis and Rash    Tongue swelling, hives   Ace Inhibitors Other (See Comments) and Cough    CKD, renal failure    Escitalopram Other (See Comments)    Buzzing in ears,headache, felt like a zombie    Evolocumab Other (See Comments)    Myalgias, flu like sx    Fenofibrate Other (See Comments)    Body aches - pt currently taking isnt sure if its causing any pain    Invokana [Canagliflozin] Other (See Comments)    Syncope / dehydration   Lisinopril Cough   Metformin And Related Itching   Pravastatin Sodium Other (See Comments)    myalgias   Milk-Related Compounds  Ties stomach in knots    Crestor [Rosuvastatin] Other (See Comments)    Myalgias    Horse-Derived Products Rash   Lac Bovis Nausea Only    Ties stomach in knots     Lexapro [Escitalopram Oxalate] Other (See Comments)    Buzzing in ears,headache, felt like a zombie   Lipitor [Atorvastatin] Other (See Comments)    myalgias   Livalo [Pitavastatin] Other (See Comments)    Myalgias    Tape Rash    Family History  Problem Relation Age of Onset   Diabetes Father    Valvular heart disease Father    Arthritis Father    Heart disease Father    Stroke Father    Alzheimer's disease Mother    Hyperlipidemia Mother    Hypertension Mother    Arthritis Mother    Lung cancer Mother    Stroke Mother    Headache Mother    Arthritis/Rheumatoid Sister    Diabetes Sister     Hypertension Sister    Hyperlipidemia Sister    Depression Sister    Dementia Maternal Aunt    Dementia Maternal Uncle    Heart disease Maternal Uncle    Stomach cancer Paternal Uncle    Colon cancer Neg Hx    Liver disease Neg Hx     Prior to Admission medications   Medication Sig Start Date End Date Taking? Authorizing Provider  albuterol (VENTOLIN HFA) 108 (90 Base) MCG/ACT inhaler Inhale 2 puffs into the lungs every 6 (six) hours as needed for wheezing or shortness of breath. 08/18/19  Yes Wendall Stade, MD  apixaban (ELIQUIS) 5 MG TABS tablet Take 1 tablet (5 mg total) by mouth 2 (two) times daily. 04/29/21  Yes Hawks, Christy A, FNP  Chlorphen-Phenyleph-ASA (ALKA-SELTZER PLUS COLD PO) Take 2 tablets by mouth daily as needed (allergies).   Yes [provider]  clopidogrel (PLAVIX) 75 MG tablet TAKE 1 TABLET BY MOUTH DAILY Patient taking differently: Take 75 mg by mouth daily. 06/23/21  Yes Maeola Harman, MD  dapagliflozin propanediol (FARXIGA) 5 MG TABS tablet Take 1 tablet (5 mg total) by mouth daily before breakfast. 04/29/21  Yes Hawks, Christy A, FNP  DULoxetine (CYMBALTA) 30 MG capsule Take 1 capsule (30 mg total) by mouth daily. 04/29/21  Yes Hawks, Christy A, FNP  ezetimibe (ZETIA) 10 MG tablet Take 1 tablet (10 mg total) by mouth daily. 04/28/21  Yes Wendall Stade, MD  fenofibrate 160 MG tablet Take 1 tablet (160 mg total) by mouth daily. 04/29/21  Yes Hawks, Christy A, FNP  furosemide (LASIX) 20 MG tablet Take Two Times Daily for 7 Days then take Daily Patient taking differently: Take 20 mg by mouth daily as needed for fluid. Take Two Times Daily for 7 Days then take Daily 04/28/21  Yes Wendall Stade, MD  gabapentin (NEURONTIN) 300 MG capsule Take 1 capsule (300 mg total) by mouth 3 (three) times daily. Patient taking differently: Take 300 mg by mouth 2 (two) times daily. 04/29/21  Yes Hawks, Christy A, FNP  HYDROcodone-acetaminophen (NORCO) 10-325 MG  tablet Take 1 tablet by mouth every 8 (eight) hours as needed for moderate pain. 03/25/21  Yes [provider]  insulin lispro (HUMALOG KWIKPEN) 100 UNIT/ML KwikPen Inject 15-20 Units into the skin 3 (three) times daily with meals. 05/07/21  Yes Hawks, Christy A, FNP  insulin NPH-regular Human (HUMULIN 70/30) (70-30) 100 UNIT/ML injection Inject 60 Units into the skin 2 (two) times daily with  a meal. 05/14/21  Yes Hawks, Christy A, FNP  isosorbide mononitrate (IMDUR) 30 MG 24 hr tablet Take 1 tablet (30 mg total) by mouth daily. 04/29/21  Yes Hawks, Christy A, FNP  linaclotide (LINZESS) 72 MCG capsule Take 1 capsule (72 mcg total) by mouth daily as needed (constipation). 07/26/19  Yes Hawks, Christy A, FNP  Menthol, Topical Analgesic, (BLUE-EMU MAXIMUM STRENGTH EX) Apply 1 application topically daily as needed (pain).   Yes [provider]  metoprolol succinate (TOPROL-XL) 50 MG 24 hr tablet Take 1 tablet (50 mg total) by mouth daily. Take with or immediately following a meal. 04/28/21  Yes Wendall Stade, MD  MOUNJARO 5 MG/0.5ML Pen INJECT 5MG  SUBCUTANEOUSLY ONCE A WEEK Patient taking differently: Inject 5 mg into the skin once a week. 06/30/21  Yes Hawks, Christy A, FNP  nitroGLYCERIN (NITROSTAT) 0.4 MG SL tablet DISSOLVE ONE TABLET UNDER THE TONGUE EVERY 5 MINUTES AS NEEDED FOR CHEST PAIN.  DO NOT EXCEED A TOTAL OF 3 DOSES IN 15 MINUTES Strength: 0.4 mg Patient taking differently: Place 0.4 mg under the tongue every 5 (five) minutes as needed for chest pain. 04/28/21  Yes Wendall Stade, MD  pantoprazole (PROTONIX) 40 MG tablet Take 1 tablet (40 mg total) by mouth daily. 04/29/21  Yes Hawks, Christy A, FNP  vitamin B-12 (CYANOCOBALAMIN) 1000 MCG tablet Take 1,000 mcg by mouth daily.   Yes [provider]  Insulin Syringe-Needle U-100 28G X 1/2" 1 ML MISC Use to give insulin five times daily Dx E11.42 05/07/21   Jannifer Rodney A, FNP  metoprolol tartrate (LOPRESSOR) 50 MG  tablet Take 1 tablet (50 mg total) by mouth daily as needed (a-fib). 04/28/21   Wendall Stade, MD    Physical Exam: Vitals:   07/25/21 1515 07/25/21 1530 07/25/21 1545 07/25/21 1630  BP:  (!) 153/106  (!) 143/81  Pulse: 99 65 97 (!) 104  Resp: 19 20 18  (!) 24  Temp:      TempSrc:      SpO2: 97% 95% 96% 97%  Weight:      Height:       General: Elderly male. Awake and alert and oriented x3. No acute cardiopulmonary distress.  HEENT: Normocephalic atraumatic.  Right and left ears normal in appearance.  Pupils equal, round, reactive to light. Extraocular muscles are intact. Sclerae anicteric and noninjected.  Moist mucosal membranes. No mucosal lesions.  Neck: Neck supple without lymphadenopathy. No carotid bruits. No masses palpated.  Cardiovascular: Regular rate with normal S1-S2 sounds. No murmurs, rubs, gallops auscultated. No JVD.  Respiratory: Good respiratory effort with no wheezes, rales, rhonchi. Lungs clear to auscultation bilaterally.  No accessory muscle use. Abdomen: Soft, nontender, nondistended. Active bowel sounds. No masses or hepatosplenomegaly  Skin: Multiple ulcerations on his lower extremities bilaterally with several small 4 to 5 mm areas of necrosis on several of his digits.  Dry, warm to touch. 2+ dorsalis pedis and radial pulses. Musculoskeletal: No calf or leg pain. All major joints not erythematous nontender.  No upper or lower joint deformation.  Good ROM.  No contractures  Psychiatric: Intact judgment and insight. Pleasant and cooperative. Neurologic: No focal neurological deficits. Strength is 5/5 and symmetric in upper and lower extremities.  Cranial nerves II through XII are grossly intact.  Data Reviewed: Results for orders placed or performed during the hospital encounter of 07/25/21 (from the past 24 hour(s))  Urinalysis, Routine w reflex microscopic Urine, Clean Catch     Status:  Abnormal   Collection Time: 07/25/21  1:44 PM  Result Value Ref Range    Color, Urine YELLOW YELLOW   APPearance CLEAR CLEAR   Specific Gravity, Urine 1.007 1.005 - 1.030   pH 7.0 5.0 - 8.0   Glucose, UA >=500 (A) NEGATIVE mg/dL   Hgb urine dipstick MODERATE (A) NEGATIVE   Bilirubin Urine NEGATIVE NEGATIVE   Ketones, ur NEGATIVE NEGATIVE mg/dL   Protein, ur 30 (A) NEGATIVE mg/dL   Nitrite NEGATIVE NEGATIVE   Leukocytes,Ua NEGATIVE NEGATIVE   RBC / HPF 0-5 0 - 5 RBC/hpf   WBC, UA 0-5 0 - 5 WBC/hpf   Bacteria, UA NONE SEEN NONE SEEN   Squamous Epithelial / LPF 0-5 0 - 5  Comprehensive metabolic panel     Status: Abnormal   Collection Time: 07/25/21  1:59 PM  Result Value Ref Range   Sodium 134 (L) 135 - 145 mmol/L   Potassium 4.2 3.5 - 5.1 mmol/L   Chloride 95 (L) 98 - 111 mmol/L   CO2 27 22 - 32 mmol/L   Glucose, Bld 136 (H) 70 - 99 mg/dL   BUN 22 8 - 23 mg/dL   Creatinine, Ser 1.61 (H) 0.61 - 1.24 mg/dL   Calcium 09.6 8.9 - 04.5 mg/dL   Total Protein 8.6 (H) 6.5 - 8.1 g/dL   Albumin 4.4 3.5 - 5.0 g/dL   AST 29 15 - 41 U/L   ALT 22 0 - 44 U/L   Alkaline Phosphatase 41 38 - 126 U/L   Total Bilirubin 0.9 0.3 - 1.2 mg/dL   GFR, Estimated 47 (L) >60 mL/min   Anion gap 12 5 - 15  Troponin I (High Sensitivity)     Status: Abnormal   Collection Time: 07/25/21  1:59 PM  Result Value Ref Range   Troponin I (High Sensitivity) 52 (H) <18 ng/L  CBC with Differential     Status: Abnormal   Collection Time: 07/25/21  1:59 PM  Result Value Ref Range   WBC 8.5 4.0 - 10.5 K/uL   RBC 5.97 (H) 4.22 - 5.81 MIL/uL   Hemoglobin 14.8 13.0 - 17.0 g/dL   HCT 40.9 81.1 - 91.4 %   MCV 84.1 80.0 - 100.0 fL   MCH 24.8 (L) 26.0 - 34.0 pg   MCHC 29.5 (L) 30.0 - 36.0 g/dL   RDW 78.2 (H) 95.6 - 21.3 %   Platelets 271 150 - 400 K/uL   nRBC 0.0 0.0 - 0.2 %   Neutrophils Relative % 75 %   Neutro Abs 6.5 1.7 - 7.7 K/uL   Lymphocytes Relative 13 %   Lymphs Abs 1.1 0.7 - 4.0 K/uL   Monocytes Relative 9 %   Monocytes Absolute 0.8 0.1 - 1.0 K/uL   Eosinophils Relative 1 %    Eosinophils Absolute 0.1 0.0 - 0.5 K/uL   Basophils Relative 1 %   Basophils Absolute 0.1 0.0 - 0.1 K/uL   WBC Morphology MORPHOLOGY UNREMARKABLE    RBC Morphology MORPHOLOGY UNREMARKABLE    Smear Review MORPHOLOGY UNREMARKABLE    Immature Granulocytes 1 %   Abs Immature Granulocytes 0.05 0.00 - 0.07 K/uL  Lactic acid, plasma     Status: None   Collection Time: 07/25/21  2:00 PM  Result Value Ref Range   Lactic Acid, Venous 1.9 0.5 - 1.9 mmol/L  Blood culture (routine x 2)     Status: None (Preliminary result)   Collection Time: 07/25/21  2:00 PM   Specimen:  BLOOD RIGHT ARM  Result Value Ref Range   Specimen Description      BLOOD RIGHT ARM BOTTLES DRAWN AEROBIC AND ANAEROBIC   Special Requests      Blood Culture adequate volume Performed at [facility], [address] [place], [place] [address]    Culture PENDING    Report Status PENDING   Sedimentation rate     Status: Abnormal   Collection Time: 07/25/21  2:00 PM  Result Value Ref Range   Sed Rate 48 (H) 0 - 16 mm/hr  Blood culture (routine x 2)     Status: None (Preliminary result)   Collection Time: 07/25/21  2:01 PM   Specimen: Left Antecubital; Blood  Result Value Ref Range   Specimen Description      LEFT ANTECUBITAL BOTTLES DRAWN AEROBIC AND ANAEROBIC   Special Requests      Blood Culture adequate volume Performed at [facility], [address] [place], [place] [address]    Culture PENDING    Report Status PENDING   Lactic acid, plasma     Status: None   Collection Time: 07/25/21  4:07 PM  Result Value Ref Range   Lactic Acid, Venous 1.9 0.5 - 1.9 mmol/L  Troponin I (High Sensitivity)     Status: Abnormal   Collection Time: 07/25/21  4:07 PM  Result Value Ref Range   Troponin I (High Sensitivity) 58 (H) <18 ng/L   MR FOOT LEFT W WO CONTRAST  Result Date: 07/25/2021 CLINICAL DATA:  Diffuse left foot pain for 2 months. No known injury. EXAM: MRI OF THE LEFT FOREFOOT WITHOUT AND WITH CONTRAST TECHNIQUE:  Multiplanar, multisequence MR imaging of the left foot was performed both before and after administration of intravenous contrast. CONTRAST:  10mL GADAVIST GADOBUTROL 1 MMOL/ML IV SOLN COMPARISON:  X-[facility] 07/25/2021 FINDINGS: Technical Note: Despite efforts by the technologist and patient, motion artifact is present on today's exam and could not be eliminated. This reduces exam sensitivity and specificity. Bones/Joint/Cartilage No acute fracture. No dislocation. Remote healed fractures of the second and third metatarsal diaphyses. Artifact near the edge of the field of view results in poor fat saturation of the toes on T2 weighted images. In correlation with sister sequences. No definite sites of bone marrow edema. No erosion or abnormal marrow enhancement. No evidence of marrow replacement. Degenerative changes of the foot are most pronounced at the first MTP joint. No effusion. Ligaments No evidence of acute ligamentous injury. Muscles and Tendons Denervation changes of the intrinsic foot musculature. Flexor and extensor tendons appear intact. No tenosynovitis. Soft tissues Subcutaneous edema most pronounced along the dorsum of the forefoot. No deep soft tissue ulceration. No organized fluid collection. IMPRESSION: 1. Limited exam. 2. Subcutaneous edema most pronounced along the dorsum of the forefoot, nonspecific but could represent cellulitis in the appropriate clinical setting. No organized fluid collection. 3. No acute osseous abnormality. No evidence of osteomyelitis. 4. Remote healed fractures of the second and third metatarsal diaphyses. Electronically Signed   By: Duanne Guess D.O.   On: 07/25/2021 17:49   DG Foot Complete Left  Result Date: 07/25/2021 CLINICAL DATA:  Pain and swelling EXAM: LEFT FOOT - COMPLETE 3+ VIEW COMPARISON:  None. FINDINGS: There is generalized soft tissue swelling of the foot. There is a there is no frank bony destruction. Evidence of old second and third metatarsal  injuries. Plantar dorsal calcaneal spurring. Vascular calcifications. IMPRESSION: Soft tissue swelling.  No acute osseous abnormality. Electronically Signed   By: Gerilyn Pilgrim  Park Breed M.D.   On: 07/25/2021 15:27   DG Foot Complete Right  Result Date: 07/25/2021 CLINICAL DATA:  Pain and swelling EXAM: RIGHT FOOT COMPLETE - 3+ VIEW COMPARISON:  None. FINDINGS: There is generalized soft tissue swelling of the foot. There is no frank bony destruction. Plantar dorsal calcaneal spurring. Vascular calcifications. IMPRESSION: Soft tissue swelling.  No acute osseous abnormality. Electronically Signed   By: Caprice Renshaw M.D.   On: 07/25/2021 15:28    EKG: Sinus rhythm with a rate of 99.  No acute ST changes.  Q waves in V1, suggestive of anterior infarct.   Assessment and Plan: No notes have been filed under this hospital service. Service: Hospitalist  Principal Problem:   Leg wound, left Active Problems:   Hypertension associated with diabetes (HCC)   GERD (gastroesophageal reflux disease)   Diabetic peripheral neuropathy associated with type 2 diabetes mellitus (HCC)   Diabetic neuropathy (HCC)   Diabetes (HCC)   Morbid obesity (HCC)   CHF (congestive heart failure) (HCC)   PAF (paroxysmal atrial fibrillation) (HCC)   S/P femoral-popliteal bypass surgery   PAOD (peripheral arterial occlusive disease) (HCC)  leg wounds bilaterally Admit to [facility] Vascular surgery consulted We will change antibiotics to Vanco, Rocephin, Flagyl Check MRSA screen and discontinue Vanco if negative We will likely need CTA angiogram of legs per Vascular surgery Check ESR, CRP Peripheral artery occlusive disease status post femoropopliteal bypass On Eliquis and Plavix Paroxysmal atrial fibrillation on anticoagulation Initial EKG was in sinus rhythm, but telemetry monitoring showed that the patient was in atrial fibrillation Diabetes with nephropathy Continue home insulin with sliding scale Continue  gabapentin Morbid obesity  Hypertension Continue antihypertensives    Advance Care Planning:   Code Status: Prior full code  Consults: Vascular surgery  Family Communication: Wife present during interview and exam  Severity of Illness: The appropriate patient status for this patient is OBSERVATION. Observation status is judged to be reasonable and necessary in order to provide the required intensity of service to ensure the patient's safety. The patient's presenting symptoms, physical exam findings, and initial radiographic and laboratory data in the context of their medical condition is felt to place them at decreased risk for further clinical deterioration. Furthermore, it is anticipated that the patient will be medically stable for discharge from the hospital within 2 midnights of admission.   Author: Levie Heritage, DO 07/25/2021 5:41 PM  For on call review [email or web address].

## 2021-07-25 NOTE — Progress Notes (Signed)
Subjective: CC:ill PCP: [name], FNP HPI:[name] [name] is a 71 y.o. male presenting to clinic today for:  Patient is accompanied today's visit by his wife.  He apparently has been ill for the last several days.  On 4 March he was having chills and shaking like episodes.  His wife was concerned for possible seizure and so she called EMS.  He was assessed but he was no longer having the symptoms.  His sugar was in the 420s at that point and they thought that perhaps some of his symptoms may have been related to that.  He subsequently did not pursue eval in the ER.  Since that time he has been having progressive weakness, shortness of breath with walking.  He is got more blisters and sores on his legs and his wife notes that the right middle toe has had some purple and black discoloration now.  He has had fever of Tmax to 102.1 yesterday at 9 AM.  He has since been taking some over-the-counter cough and cold medications and has not had another fever.  He reports dysuria x1 week.  No hematuria reported.  He has been using more of his fluid pill because he has been having a little bit of fluid retention.  ROS: Per HPI  Allergies  Allergen Reactions   Shellfish Allergy Anaphylaxis and Other (See Comments)    Tongue swelling, hives    Sulfa Antibiotics Anaphylaxis and Rash    Tongue swelling, hives   Ace Inhibitors Other (See Comments) and Cough    CKD, renal failure    Escitalopram Other (See Comments)    Buzzing in ears,headache, felt like a zombie    Evolocumab Other (See Comments)    Myalgias, flu like sx    Fenofibrate Other (See Comments)    Body aches - pt currently taking isnt sure if its causing any pain    Invokana [Canagliflozin] Other (See Comments)    Syncope / dehydration   Lisinopril Cough   Metformin And Related Itching   Pravastatin Sodium Other (See Comments)    myalgias   Milk-Related Compounds     Ties stomach in knots    Crestor [Rosuvastatin] Other (See  Comments)    Myalgias    Horse-Derived Products Rash   Lac Bovis Nausea Only    Ties stomach in knots     Lexapro [Escitalopram Oxalate] Other (See Comments)    Buzzing in ears,headache, felt like a zombie   Lipitor [Atorvastatin] Other (See Comments)    myalgias   Livalo [Pitavastatin] Other (See Comments)    Myalgias    Tape Rash   Past Medical History:  Diagnosis Date   Anxiety    Arthritis    Atrial fibrillation (HCC)    CAD (coronary artery disease)    a. 2010: DES to CTO of RCA. EF 55% b. 07/2016: cath showing total occlusion within previously placed RCA stent (collaterals present), severe stenosis along LCx and OM1 (treated with 2 overlapping DES). c. repeat cath in 01/2018 showing patent stents along LCx and OM with CTO of D2, CTO of distal LCx, and CTO of RCA with collaterals present overall unchanged since 2018 with medical management recom   Cellulitis and abscess rt groin    Complication of anesthesia    " I woke up during a colonoscopy "      Depression    Diabetes mellitus    Diastolic CHF ([place])    Disorders of iron metabolism  Dysrhythmia    GERD (gastroesophageal reflux disease)    Hyperlipidemia    Hypertension    Low serum testosterone level    Medically noncompliant    Myocardial infarction [facility])    05-23-20    Current Outpatient Medications:    albuterol (VENTOLIN HFA) 108 (90 Base) MCG/ACT inhaler, Inhale 2 puffs into the lungs every 6 (six) hours as needed for wheezing or shortness of breath., Disp: 8.5 g, Rfl: 3   apixaban (ELIQUIS) 5 MG TABS tablet, Take 1 tablet (5 mg total) by mouth 2 (two) times daily., Disp: 180 tablet, Rfl: 1   Chlorphen-Phenyleph-ASA (ALKA-SELTZER PLUS COLD PO), Take 2 tablets by mouth daily as needed (allergies)., Disp: , Rfl:    clopidogrel (PLAVIX) 75 MG tablet, TAKE 1 TABLET BY MOUTH DAILY, Disp: 30 tablet, Rfl: 3   dapagliflozin propanediol (FARXIGA) 5 MG TABS tablet, Take 1 tablet (5 mg total) by mouth daily before  breakfast., Disp: 90 tablet, Rfl: 2   DULoxetine (CYMBALTA) 30 MG capsule, Take 1 capsule (30 mg total) by mouth daily., Disp: 90 capsule, Rfl: 1   ezetimibe (ZETIA) 10 MG tablet, Take 1 tablet (10 mg total) by mouth daily., Disp: 90 tablet, Rfl: 3   fenofibrate 160 MG tablet, Take 1 tablet (160 mg total) by mouth daily., Disp: 90 tablet, Rfl: 3   fluconazole (DIFLUCAN) 150 MG tablet, TAKE 1 TABLET BY MOUTH EVERY 3 DAYS AS NEEDED, Disp: 3 tablet, Rfl: 0   furosemide (LASIX) 20 MG tablet, Take Two Times Daily for 7 Days then take Daily, Disp: 97 tablet, Rfl: 3   gabapentin (NEURONTIN) 300 MG capsule, Take 1 capsule (300 mg total) by mouth 3 (three) times daily., Disp: 270 capsule, Rfl: 1   HYDROcodone-acetaminophen (NORCO) 5-325 MG tablet, Take 1-2 tablets by mouth every 6 (six) hours as needed for moderate pain., Disp: 20 tablet, Rfl: 0   insulin lispro (HUMALOG KWIKPEN) 100 UNIT/ML KwikPen, Inject 15-20 Units into the skin 3 (three) times daily with meals., Disp: 30 mL, Rfl: 2   insulin NPH-regular Human (HUMULIN 70/30) (70-30) 100 UNIT/ML injection, Inject 60 Units into the skin 2 (two) times daily with a meal., Disp: 40 mL, Rfl: 2   Insulin Syringe-Needle U-100 28G X 1/2" 1 ML MISC, Use to give insulin five times daily Dx E11.42, Disp: 500 each, Rfl: 3   isosorbide mononitrate (IMDUR) 30 MG 24 hr tablet, Take 1 tablet (30 mg total) by mouth daily., Disp: 90 tablet, Rfl: 1   linaclotide (LINZESS) 72 MCG capsule, Take 1 capsule (72 mcg total) by mouth daily as needed (constipation)., Disp: 90 capsule, Rfl: 3   Menthol, Topical Analgesic, (BLUE-EMU MAXIMUM STRENGTH EX), Apply 1 application topically daily as needed (pain)., Disp: , Rfl:    metoprolol succinate (TOPROL-XL) 50 MG 24 hr tablet, Take 1 tablet (50 mg total) by mouth daily. Take with or immediately following a meal., Disp: 90 tablet, Rfl: 3   metoprolol tartrate (LOPRESSOR) 50 MG tablet, Take 1 tablet (50 mg total) by mouth daily as needed  (a-fib)., Disp: 30 tablet, Rfl: 6   MOUNJARO 5 MG/0.5ML Pen, INJECT '5MG'$  SUBCUTANEOUSLY ONCE A WEEK, Disp: 2 mL, Rfl: 10   nitroGLYCERIN (NITROSTAT) 0.4 MG SL tablet, DISSOLVE ONE TABLET UNDER THE TONGUE EVERY 5 MINUTES AS NEEDED FOR CHEST PAIN.  DO NOT EXCEED A TOTAL OF 3 DOSES IN 15 MINUTES Strength: 0.4 mg, Disp: 25 tablet, Rfl: 0   pantoprazole (PROTONIX) 40 MG tablet, Take 1 tablet (40 mg  total) by mouth daily., Disp: 90 tablet, Rfl: 1   vitamin B-12 (CYANOCOBALAMIN) 1000 MCG tablet, Take 1,000 mcg by mouth daily., Disp: , Rfl:    Vitamin D, Ergocalciferol, (DRISDOL) 1.25 MG (50000 UNIT) CAPS capsule, Take 50,000 Units by mouth once a week., Disp: , Rfl:  Social History   Socioeconomic History   Marital status: Married    Spouse name: Belenda Cruise   Number of children: 2   Years of education: 12   Highest education level: 12th grade  Occupational History   Occupation: retired  Tobacco Use   Smoking status: Former    Packs/day: 0.25    Years: 51.00    Pack years: 12.75    Types: Cigarettes    Quit date: 08/14/2016    Years since quitting: 4.9   Smokeless tobacco: Never   Tobacco comments:    smokes  a pack a week  Vaping Use   Vaping Use: Never used  Substance and Sexual Activity   Alcohol use: No    Alcohol/week: 0.0 standard drinks   Drug use: No   Sexual activity: Yes  Other Topics Concern   Not on file  Social History Narrative   Lives with his wife.  Retired.  Unable to afford expensive medicines.     He has 2 children from previous marriage - they live in [place]       Caffeine: 1 cup of 1/2 caff coffee in AM, drinks more if at a restaurant    Social Determinants of Health   Financial Resource Strain: Medium Risk   Difficulty of Paying Living Expenses: Somewhat hard  Food Insecurity: No Food Insecurity   Worried About Charity fundraiser in the Last Year: Never true   Ran Out of Food in the Last Year: Never true  Transportation Needs: No Transportation Needs    Lack of Transportation (Medical): No   Lack of Transportation (Non-Medical): No  Physical Activity: Insufficiently Active   Days of Exercise per Week: 7 days   Minutes of Exercise per Session: 10 min  Stress: No Stress Concern Present   Feeling of Stress : Only a little  Social Connections: Moderately Isolated   Frequency of Communication with Friends and Family: More than three times a week   Frequency of Social Gatherings with Friends and Family: Twice a week   Attends Religious Services: Never   Marine scientist or Organizations: No   Attends Music therapist: Never   Marital Status: Married  Human resources officer Violence: Not At Risk   Fear of Current or Ex-Partner: No   Emotionally Abused: No   Physically Abused: No   Sexually Abused: No   Family History  Problem Relation Age of Onset   Diabetes Father    Valvular heart disease Father    Arthritis Father    Heart disease Father    Stroke Father    Alzheimer's disease Mother    Hyperlipidemia Mother    Hypertension Mother    Arthritis Mother    Lung cancer Mother    Stroke Mother    Headache Mother    Arthritis/Rheumatoid Sister    Diabetes Sister    Hypertension Sister    Hyperlipidemia Sister    Depression Sister    Dementia Maternal Aunt    Dementia Maternal Uncle    Heart disease Maternal Uncle    Stomach cancer Paternal Uncle    Colon cancer Neg Hx    Liver disease Neg Hx  Objective: Office vital signs reviewed. BP (!) 96/55    Pulse (!) 47    Temp 98 F (36.7 C) (Temporal)    Ht '6\' 2"'$  (1.88 m)    SpO2 98%    BMI 35.44 kg/m   Physical Examination:  General: Awake, alert, morbidly obese, very drowsy and ill-appearing male HEENT: Sclera white Cardio: Bradycardic with irregular rhythm. Pulm: Slight decreased breath sounds in the left upper lung fields but otherwise clear to auscultation bilaterally with normal work of breathing on room air. MSK: Arrives in wheelchair.  Gait is  extremely slow and unsteady.  Bilateral lower extremities with multiple open sores.  No overt purulence appreciated but he does have some serosanguineous drainage from both legs.  Assessment/ Plan: 71 y.o. male   SIRS (systemic inflammatory response syndrome) (HCC)  Dysuria - Plan: Urinalysis, Routine w reflex microscopic  Highly concerned for systemic inflammatory response syndrome given hypotension.  He is also bradycardic here in office.  Uncertain as to what the etiology of infection is.  He is afebrile here in office but Tmax at home has been 102.62F.  Urinalysis without evidence of infection in it today.  Suspect that he will need some type of aggressive rehydration.  I discussed my concerns with both the patient and his wife and he has agreed to go to the ER for further evaluation.  They will be taken by ambulance.  Orders Placed This Encounter  Procedures   Urinalysis, Routine w reflex microscopic   No orders of the defined types were placed in this encounter.    Janora Norlander, DO [place] [phone]

## 2021-07-25 NOTE — ED Triage Notes (Signed)
Pt arrived via [facility] from [place] c/o low BP and low HR. Per EMS, medical facility stated BP was 93/48 and pulse 47. Pulse was in the 70-80s for EMS ?

## 2021-07-25 NOTE — ED Provider Notes (Signed)
[place] Provider Note  CSN: [id number] Arrival date & time: 07/25/21 1300  Chief Complaint(s) Hypotension  HPI DEMETRIC DUNNAWAY is a 71 y.o. male dismal A-fib on Eliquis, severe peripheral arterial disease status post femoral bypass, HTN, HLD, insulin-dependent diabetes, extensive CAD status post multiple stents on Plavix who presents to the emergency department from family medicine office due to concern for sepsis.  Patient states that over the last 3 days he has had severe fatigue, fever with Tmax 102.1, rigors.  He states that his lower extremity pain has been worsening left greater than right and the ulcers that are forming on his feet have worsened.  He also is endorsing worsening overflow incontinence.  He denies chest pain, shortness of breath, nausea, vomiting or other systemic symptoms.  His family medicine provider saw him today and found the patient to be hypotensive and bradycardic and sent him to the emergency department for evaluation.  Urinalysis negative at PCPs office.  HPI  Past Medical History Past Medical History:  Diagnosis Date   Anxiety    Arthritis    Atrial fibrillation ([place])    CAD (coronary artery disease)    a. 2010: DES to CTO of RCA. EF 55% b. 07/2016: cath showing total occlusion within previously placed RCA stent (collaterals present), severe stenosis along LCx and OM1 (treated with 2 overlapping DES). c. repeat cath in 01/2018 showing patent stents along LCx and OM with CTO of D2, CTO of distal LCx, and CTO of RCA with collaterals present overall unchanged since 2018 with medical management recom   Cellulitis and abscess rt groin    Complication of anesthesia    " I woke up during a colonoscopy "      Depression    Diabetes mellitus    Diastolic CHF ([place])    Disorders of iron metabolism    Dysrhythmia    GERD (gastroesophageal reflux disease)    Hyperlipidemia    Hypertension    Low serum testosterone level    Medically  noncompliant    Myocardial infarction [facility])    05-23-20   Patient Active Problem List   Diagnosis Date Noted   S/P femoral-popliteal bypass surgery 11/22/2020   PAOD (peripheral arterial occlusive disease) ([place]) 11/22/2020   Non-ST elevation (NSTEMI) myocardial infarction ([place]) 05/23/2020   Orthostatic hypotension 01/01/2020   Statin myopathy 12/06/2019   Diarrhea 05/10/2018   PAF (paroxysmal atrial fibrillation) ([place]) 03/29/2018   Lacunar infarction ([place]) 03/29/2018   Unstable angina (HCC)    Chest pain 01/18/2018   CHF (congestive heart failure) ([place]) 10/21/2017   Anxiety 03/09/2017   Constipation 11/26/2016   Diabetes ([place]) 05/25/2016   Occlusion and stenosis of vertebral artery    Acute renal failure ([place]) 05/23/2016   Diabetic neuropathy ([place]) 01/13/2016   Depression 11/01/2015   Erectile dysfunction 01/23/2015   Morbid obesity ([place]) 01/23/2015   Periodontal disease 01/23/2015   Stopped smoking with greater than 30 pack year history 01/23/2015   Diabetic peripheral neuropathy associated with type 2 diabetes mellitus ([place]) 11/08/2014   Mucosal abnormality of stomach    History of colonic polyps    GERD (gastroesophageal reflux disease) 09/06/2014   Low serum testosterone level 08/01/2014   DDD (degenerative disc disease), cervical 12/20/2013   Headache 08/07/2013   Medically noncompliant 10/25/2011   CAD S/P percutaneous coronary angioplasty 10/25/2011   MURMUR 04/16/2009   Hypertension associated with diabetes ([place]) 08/02/2008   Hyperlipidemia associated with type 2 diabetes mellitus ([place]) 07/31/2008  DISORDERS OF IRON METABOLISM 07/31/2008   Home Medication(s) Prior to Admission medications   Medication Sig Start Date End Date Taking? Authorizing Provider  albuterol (VENTOLIN HFA) 108 (90 Base) MCG/ACT inhaler Inhale 2 puffs into the lungs every 6 (six) hours as needed for wheezing or shortness of breath. 08/18/19   Josue Hector, MD  apixaban (ELIQUIS) 5 MG TABS  tablet Take 1 tablet (5 mg total) by mouth 2 (two) times daily. 04/29/21   [name], FNP  Chlorphen-Phenyleph-ASA (ALKA-SELTZER PLUS COLD PO) Take 2 tablets by mouth daily as needed (allergies).    [provider]  clopidogrel (PLAVIX) 75 MG tablet TAKE 1 TABLET BY MOUTH DAILY 06/23/21   Waynetta Sandy, MD  dapagliflozin propanediol (FARXIGA) 5 MG TABS tablet Take 1 tablet (5 mg total) by mouth daily before breakfast. 04/29/21   [name], FNP  DULoxetine (CYMBALTA) 30 MG capsule Take 1 capsule (30 mg total) by mouth daily. 04/29/21   [name], FNP  ezetimibe (ZETIA) 10 MG tablet Take 1 tablet (10 mg total) by mouth daily. 04/28/21   Josue Hector, MD  fenofibrate 160 MG tablet Take 1 tablet (160 mg total) by mouth daily. 04/29/21   [name], FNP  furosemide (LASIX) 20 MG tablet Take Two Times Daily for 7 Days then take Daily 04/28/21   Josue Hector, MD  gabapentin (NEURONTIN) 300 MG capsule Take 1 capsule (300 mg total) by mouth 3 (three) times daily. 04/29/21   [name], FNP  HYDROcodone-acetaminophen (NORCO) 10-325 MG tablet Take 1 tablet by mouth every 8 (eight) hours as needed. 03/25/21   [provider]  insulin lispro (HUMALOG KWIKPEN) 100 UNIT/ML KwikPen Inject 15-20 Units into the skin 3 (three) times daily with meals. 05/07/21   [name], FNP  insulin NPH-regular Human (HUMULIN 70/30) (70-30) 100 UNIT/ML injection Inject 60 Units into the skin 2 (two) times daily with a meal. 05/14/21   [name] A, FNP  Insulin Syringe-Needle U-100 28G X 1/2" 1 ML MISC Use to give insulin five times daily Dx E11.42 05/07/21   [name] A, FNP  isosorbide mononitrate (IMDUR) 30 MG 24 hr tablet Take 1 tablet (30 mg total) by mouth daily. 04/29/21   [name], FNP  linaclotide (LINZESS) 72 MCG capsule Take 1 capsule (72 mcg total) by mouth daily as needed (constipation). 07/26/19   [name], FNP  Menthol,  Topical Analgesic, (BLUE-EMU MAXIMUM STRENGTH EX) Apply 1 application topically daily as needed (pain).    [provider]  metoprolol succinate (TOPROL-XL) 50 MG 24 hr tablet Take 1 tablet (50 mg total) by mouth daily. Take with or immediately following a meal. 04/28/21   Josue Hector, MD  metoprolol tartrate (LOPRESSOR) 50 MG tablet Take 1 tablet (50 mg total) by mouth daily as needed (a-fib). 04/28/21   Josue Hector, MD  [facility] 5 MG/0.5ML Pen INJECT '5MG'$  SUBCUTANEOUSLY ONCE A WEEK 06/30/21   [name] A, FNP  nitroGLYCERIN (NITROSTAT) 0.4 MG SL tablet DISSOLVE ONE TABLET UNDER THE TONGUE EVERY 5 MINUTES AS NEEDED FOR CHEST PAIN.  DO NOT EXCEED A TOTAL OF 3 DOSES IN 15 MINUTES Strength: 0.4 mg 04/28/21   Josue Hector, MD  pantoprazole (PROTONIX) 40 MG tablet Take 1 tablet (40 mg total) by mouth daily. 04/29/21   [name], FNP  vitamin B-12 (CYANOCOBALAMIN) 1000 MCG tablet Take 1,000 mcg by mouth daily.  [provider]  Vitamin D, Ergocalciferol, (DRISDOL) 1.25 MG (50000 UNIT) CAPS capsule Take 50,000 Units by mouth once a week. 06/12/21   [provider]                                                                                                                                    Past Surgical History Past Surgical History:  Procedure Laterality Date   ABDOMINAL AORTOGRAM W/LOWER EXTREMITY N/A 03/21/2019   Procedure: ABDOMINAL AORTOGRAM W/LOWER EXTREMITY;  Surgeon: [name], MD;  Location: [place] [facility];  Service: Cardiovascular;  Laterality: N/A;   ABDOMINAL AORTOGRAM W/LOWER EXTREMITY N/A 11/12/2020   Procedure: ABDOMINAL AORTOGRAM W/LOWER EXTREMITY;  Surgeon: [name], MD;  Location: [place] [facility];  Service: Cardiovascular;  Laterality: N/A;   ABDOMINAL AORTOGRAM W/LOWER EXTREMITY N/A 01/28/2021   Procedure: ABDOMINAL AORTOGRAM W/LOWER EXTREMITY;  Surgeon: [name], MD;  Location: [place] [facility];  Service:  Cardiovascular;  Laterality: N/A;   BACK SURGERY  2015   ACDF by Dr. [name]   COLONOSCOPY N/A 10/01/2014   Dr. [name]: multiple tubular adenomas removed, colonic diverticulosis, redundant colon. next tcs advised for 09/2017. PATIENT NEEDS PROPOFOL FOR FAILED CONSCIOUS SEDATION   CORONARY STENT INTERVENTION N/A 07/30/2016   Procedure: Coronary Stent Intervention;  Surgeon: [name], MD;  Location: [facility];  Service: Cardiovascular;  Laterality: N/A;   CORONARY STENT PLACEMENT  2000   By Dr. Olevia Perches   EP IMPLANTABLE DEVICE N/A 05/25/2016   Procedure: Loop Recorder Insertion;  Surgeon: [name], MD;  Location: [place] [facility];  Service: Cardiovascular;  Laterality: N/A;   ESOPHAGOGASTRODUODENOSCOPY     esophagus stretched remotely at [facility]   ESOPHAGOGASTRODUODENOSCOPY N/A 10/01/2014   Dr. [name]: patchy mottling/erythema and minimal polypoid appearance of gastric mucosa. bx with mild inlammation but no H.pylori   FEMORAL-POPLITEAL BYPASS GRAFT Left 11/22/2020   Procedure: LEFT FEMORAL-POPLITEAL BYPASS GRAFT;  Surgeon: [name], MD;  Location: [facility];  Service: Vascular;  Laterality: Left;   HERNIA REPAIR  7035   umbilical   INSERTION OF ILIAC STENT Right 11/22/2020   Procedure: INSERTION OF ELUVIA STENT INTO RIGHT DISTAL SUPERFICIAL FEMORAL ARTERY;  Surgeon: [name], MD;  Location: [facility];  Service: Vascular;  Laterality: Right;   LEFT HEART CATH AND CORONARY ANGIOGRAPHY N/A 07/30/2016   Procedure: Left Heart Cath and Coronary Angiography;  Surgeon: [name], MD;  Location: [place] [facility];  Service: Cardiovascular;  Laterality: N/A;   LEFT HEART CATH AND CORONARY ANGIOGRAPHY N/A 01/19/2018   Procedure: LEFT HEART CATH AND CORONARY ANGIOGRAPHY;  Surgeon: [name], MD;  Location: [place] [facility];  Service: Cardiovascular;  Laterality: N/A;   LEFT HEART CATH AND CORONARY ANGIOGRAPHY N/A 05/24/2020   Procedure: LEFT HEART CATH AND CORONARY ANGIOGRAPHY;   Surgeon: Burnell Blanks, MD;  Location: [place] [facility]  LAB;  Service: Cardiovascular;  Laterality: N/A;   LESION REMOVAL     Lip and hand    LOWER EXTREMITY ANGIOGRAM Right 11/22/2020   Procedure: RIGHT LEG ANGIOGRAM;  Surgeon: [name], MD;  Location: [facility];  Service: Vascular;  Laterality: Right;   LOWER EXTREMITY ANGIOGRAPHY N/A 04/18/2019   Procedure: LOWER EXTREMITY ANGIOGRAPHY;  Surgeon: [name], MD;  Location: [place] [facility];  Service: Cardiovascular;  Laterality: N/A;   NECK SURGERY     PERIPHERAL VASCULAR BALLOON ANGIOPLASTY  04/18/2019   Procedure: PERIPHERAL VASCULAR BALLOON ANGIOPLASTY;  Surgeon: [name], MD;  Location: [place] [facility];  Service: Cardiovascular;;   PERIPHERAL VASCULAR BALLOON ANGIOPLASTY Left 11/12/2020   Procedure: PERIPHERAL VASCULAR BALLOON ANGIOPLASTY;  Surgeon: [name], MD;  Location: [place] [facility];  Service: Cardiovascular;  Laterality: Left;  Failed PTA of superficial femoral artery.   PERIPHERAL VASCULAR INTERVENTION Right 03/21/2019   Procedure: PERIPHERAL VASCULAR INTERVENTION;  Surgeon: [name], MD;  Location: [place] [facility];  Service: Cardiovascular;  Laterality: Right;  superficial femoral   Family History Family History  Problem Relation Age of Onset   Diabetes Father    Valvular heart disease Father    Arthritis Father    Heart disease Father    Stroke Father    Alzheimer's disease Mother    Hyperlipidemia Mother    Hypertension Mother    Arthritis Mother    Lung cancer Mother    Stroke Mother    Headache Mother    Arthritis/Rheumatoid Sister    Diabetes Sister    Hypertension Sister    Hyperlipidemia Sister    Depression Sister    Dementia Maternal Aunt    Dementia Maternal Uncle    Heart disease Maternal Uncle    Stomach cancer Paternal Uncle    Colon cancer Neg Hx    Liver disease Neg Hx     Social History Social History   Tobacco Use   Smoking status: Former     Packs/day: 0.25    Years: 51.00    Pack years: 12.75    Types: Cigarettes    Quit date: 08/14/2016    Years since quitting: 4.9   Smokeless tobacco: Never   Tobacco comments:    smokes  a pack a week  Vaping Use   Vaping Use: Never used  Substance Use Topics   Alcohol use: No    Alcohol/week: 0.0 standard drinks   Drug use: No   Allergies Shellfish allergy, Sulfa antibiotics, Ace inhibitors, Escitalopram, Evolocumab, Fenofibrate, Invokana [canagliflozin], Lisinopril, Metformin and related, Pravastatin sodium, Milk-related compounds, Crestor [rosuvastatin], Horse-derived products, Lac bovis, Lexapro [escitalopram oxalate], Lipitor [atorvastatin], Livalo [pitavastatin], and Tape  Review of Systems Review of Systems  Constitutional:  Positive for fatigue and fever.  Musculoskeletal:  Positive for arthralgias and myalgias.  Skin:  Positive for wound.   Physical Exam Vital Signs  I have reviewed the triage vital signs BP (!) 119/92 (BP Location: Right Arm)    Pulse 94    Temp 97.6 F (36.4 C) (Oral)    Resp 16    Ht '6\' 2"'$  (1.88 m)    Wt 131.5 kg    SpO2 99%    BMI 37.23 kg/m   Physical Exam Vitals and nursing note reviewed.  Constitutional:      General: He is not in acute distress.    Appearance: He is well-developed.  HENT:     Head: Normocephalic and  atraumatic.  Eyes:     Conjunctiva/sclera: Conjunctivae normal.  Cardiovascular:     Rate and Rhythm: Normal rate and regular rhythm.     Heart sounds: No murmur heard. Pulmonary:     Effort: Pulmonary effort is normal. No respiratory distress.     Breath sounds: Normal breath sounds.  Abdominal:     Palpations: Abdomen is soft.     Tenderness: There is no abdominal tenderness.  Musculoskeletal:        General: No swelling.     Cervical back: Neck supple.     Comments: Bilateral lower extremity arterial insufficiency ulcers, dopplerable pulse on the right, nonpalpable pulse on the left, erythema extending to the midfoot  bilaterally left greater than right  Skin:    General: Skin is warm and dry.     Capillary Refill: Capillary refill takes less than 2 seconds.  Neurological:     Mental Status: He is alert.  Psychiatric:        Mood and Affect: Mood normal.    ED Results and Treatments Labs (all labs ordered are listed, but only abnormal results are displayed) Labs Reviewed  CBC WITH DIFFERENTIAL/PLATELET - Abnormal; Notable for the following components:      Result Value   RBC 5.97 (*)    MCH 24.8 (*)    MCHC 29.5 (*)    RDW 23.4 (*)    All other components within normal limits  CULTURE, BLOOD (ROUTINE X 2)  CULTURE, BLOOD (ROUTINE X 2)  LACTIC ACID, PLASMA  COMPREHENSIVE METABOLIC PANEL  URINALYSIS, ROUTINE W REFLEX MICROSCOPIC  LACTIC ACID, PLASMA  TROPONIN I (HIGH SENSITIVITY)                                                                                                                          Radiology No results found.  Pertinent labs & imaging results that were available during my care of the patient were reviewed by me and considered in my medical decision making (see MDM for details).  Medications Ordered in ED Medications - No data to display                                                                                                                                   Procedures Procedures  (including critical care time)  Medical Decision Making / ED Course   This patient presents to the ED  for concern of lower extremity pain, swelling, fever, this involves an extensive number of treatment options, and is a complaint that carries with it a high risk of complications and morbidity.  The differential diagnosis includes worsening lower extremity arterial insufficiency, osteomyelitis, sepsis  MDM: Patient seen the emergency department for evaluation of lower extremity pain, fever and concern for sepsis.  Physical exam revealing bilateral lower extremity arterial  insufficiency ulcers, erythema extending to the midfoot left greater than right.  Tenderness to palpation on the lower extremities.  Dopplerable pulse on the right, nonpalpable on the left.  After these findings, vascular surgery consulted and I spoke with Dr. Carlis Abbott who states that this is known based on his last CT angiogram as he will only have a peroneal runoff.  He states that the patient will ultimately need admission for angiography and vascular evaluation at [facility] but the patient will need additional work-up for suspected sepsis.  Laboratory evaluation with a troponin of 52 likely stress demargination in the setting of critical illness.  Laboratory evaluation otherwise fairly unremarkable outside of a creatinine of 1.57 which is the patient's baseline, lactate negative at 1.9, urinalysis negative.  X-rays of bilateral extremities showing no overt signs of osteomyelitis.  MRI of the left foot ordered.  Broad-spectrum antibiotics initiated for suspected source of osteomyelitis versus worsening arterial insufficiency.  As the patient is already appropriately anticoagulated we will not start heparin drip and this was confirmed as okay by the vascular team.  Patient then admitted to medicine for bed at [facility].   Additional history obtained: -Additional history obtained from wife -External records from outside source obtained and reviewed including: Chart review including previous notes, labs, imaging, consultation notes   Lab Tests: -I ordered, reviewed, and interpreted labs.   The pertinent results include:   Labs Reviewed  CBC WITH DIFFERENTIAL/PLATELET - Abnormal; Notable for the following components:      Result Value   RBC 5.97 (*)    MCH 24.8 (*)    MCHC 29.5 (*)    RDW 23.4 (*)    All other components within normal limits  CULTURE, BLOOD (ROUTINE X 2)  CULTURE, BLOOD (ROUTINE X 2)  LACTIC ACID, PLASMA  COMPREHENSIVE METABOLIC PANEL  URINALYSIS, ROUTINE W REFLEX MICROSCOPIC   LACTIC ACID, PLASMA  TROPONIN I (HIGH SENSITIVITY)      EKG   EKG Interpretation  Date/Time:  Friday July 25 2021 13:15:49 EST Ventricular Rate:  99 PR Interval:  127 QRS Duration: 111 QT Interval:  377 QTC Calculation: 484 R Axis:   104 Text Interpretation: Sinus rhythm Confirmed by [place] ([id number]) on 07/25/2021 4:28:24 PM         Imaging Studies ordered: I ordered imaging studies including XR feet, MRI foot I independently visualized and interpreted imaging. I agree with the radiologist interpretation   Medicines ordered and prescription drug management: No orders of the defined types were placed in this encounter.   -I have reviewed the patients home medicines and have made adjustments as needed  Critical interventions Multiple abx  Consultations Obtained: I requested consultation with the vascular surgery,  and discussed lab and imaging findings as well as pertinent plan - they recommend: Ultimate admission at [facility] for angiography   Cardiac Monitoring: The patient was maintained on a cardiac monitor.  I personally viewed and interpreted the cardiac monitored which showed an underlying rhythm of: Normal sinus rhythm, A-fib  Social Determinants of Health:  Factors impacting patients care include:  none   Reevaluation: After the interventions noted above, I reevaluated the patient and found that they have :improved  Co morbidities that complicate the patient evaluation  Past Medical History:  Diagnosis Date   Anxiety    Arthritis    Atrial fibrillation ([place])    CAD (coronary artery disease)    a. 2010: DES to CTO of RCA. EF 55% b. 07/2016: cath showing total occlusion within previously placed RCA stent (collaterals present), severe stenosis along LCx and OM1 (treated with 2 overlapping DES). c. repeat cath in 01/2018 showing patent stents along LCx and OM with CTO of D2, CTO of distal LCx, and CTO of RCA with collaterals present overall  unchanged since 2018 with medical management recom   Cellulitis and abscess rt groin    Complication of anesthesia    " I woke up during a colonoscopy "      Depression    Diabetes mellitus    Diastolic CHF ([place])    Disorders of iron metabolism    Dysrhythmia    GERD (gastroesophageal reflux disease)    Hyperlipidemia    Hypertension    Low serum testosterone level    Medically noncompliant    Myocardial infarction [facility])    05-23-20      Dispostion: I considered admission for this patient, and due to suspected worsening arterial insufficiency, patient will be admitted     Final Clinical Impression(s) / ED Diagnoses Final diagnoses:  None     '@PCDICTATION'$ @    Teressa Lower, MD 07/25/21 1629

## 2021-07-26 ENCOUNTER — Inpatient Hospital Stay (HOSPITAL_COMMUNITY): Payer: Medicare Other

## 2021-07-26 DIAGNOSIS — I480 Paroxysmal atrial fibrillation: Secondary | ICD-10-CM

## 2021-07-26 DIAGNOSIS — I779 Disorder of arteries and arterioles, unspecified: Secondary | ICD-10-CM

## 2021-07-26 DIAGNOSIS — R7881 Bacteremia: Secondary | ICD-10-CM

## 2021-07-26 DIAGNOSIS — S81802S Unspecified open wound, left lower leg, sequela: Secondary | ICD-10-CM

## 2021-07-26 DIAGNOSIS — E1142 Type 2 diabetes mellitus with diabetic polyneuropathy: Secondary | ICD-10-CM

## 2021-07-26 DIAGNOSIS — N179 Acute kidney failure, unspecified: Secondary | ICD-10-CM

## 2021-07-26 DIAGNOSIS — L0390 Cellulitis, unspecified: Secondary | ICD-10-CM

## 2021-07-26 DIAGNOSIS — Z794 Long term (current) use of insulin: Secondary | ICD-10-CM

## 2021-07-26 LAB — CBC
HCT: 46.4 % (ref 39.0–52.0)
Hemoglobin: 14.2 g/dL (ref 13.0–17.0)
MCH: 25.4 pg — ABNORMAL LOW (ref 26.0–34.0)
MCHC: 30.6 g/dL (ref 30.0–36.0)
MCV: 82.9 fL (ref 80.0–100.0)
Platelets: 273 10*3/uL (ref 150–400)
RBC: 5.6 MIL/uL (ref 4.22–5.81)
RDW: 22.9 % — ABNORMAL HIGH (ref 11.5–15.5)
WBC: 12.8 10*3/uL — ABNORMAL HIGH (ref 4.0–10.5)
nRBC: 0 % (ref 0.0–0.2)

## 2021-07-26 LAB — BLOOD CULTURE ID PANEL (REFLEXED) - BCID2

## 2021-07-26 LAB — BASIC METABOLIC PANEL
Anion gap: 13 (ref 5–15)
BUN: 21 mg/dL (ref 8–23)
CO2: 24 mmol/L (ref 22–32)
Calcium: 9.5 mg/dL (ref 8.9–10.3)
Chloride: 94 mmol/L — ABNORMAL LOW (ref 98–111)
Creatinine, Ser: 1.91 mg/dL — ABNORMAL HIGH (ref 0.61–1.24)
GFR, Estimated: 37 mL/min — ABNORMAL LOW (ref >60)
Glucose, Bld: 171 mg/dL — ABNORMAL HIGH (ref 70–99)
Potassium: 5.2 mmol/L — ABNORMAL HIGH (ref 3.5–5.1)
Sodium: 131 mmol/L — ABNORMAL LOW (ref 135–145)

## 2021-07-26 LAB — GLUCOSE, CAPILLARY
Glucose-Capillary: 170 mg/dL — ABNORMAL HIGH (ref 70–99)
Glucose-Capillary: 172 mg/dL — ABNORMAL HIGH (ref 70–99)
Glucose-Capillary: 179 mg/dL — ABNORMAL HIGH (ref 70–99)
Glucose-Capillary: 182 mg/dL — ABNORMAL HIGH (ref 70–99)
Glucose-Capillary: 194 mg/dL — ABNORMAL HIGH (ref 70–99)

## 2021-07-26 LAB — HEPARIN LEVEL (UNFRACTIONATED): Heparin Unfractionated: >1.1 IU/mL — ABNORMAL HIGH (ref 0.30–0.70)

## 2021-07-26 LAB — APTT: aPTT: 53 seconds — ABNORMAL HIGH (ref 24–36)

## 2021-07-26 LAB — HIV ANTIBODY (ROUTINE TESTING W REFLEX): HIV Screen 4th Generation wRfx: NONREACTIVE

## 2021-07-26 MED ORDER — HYDROCODONE-ACETAMINOPHEN 10-325 MG PO TABS
1.0000 | ORAL_TABLET | ORAL | Status: DC | PRN
  Administered 2021-07-26 – 2021-07-29 (×8): 1 via ORAL
  Filled 2021-07-26 (×8): qty 1

## 2021-07-26 MED ORDER — PENICILLIN G POTASSIUM 20000000 UNITS IJ SOLR
4.0000 10*6.[IU] | INTRAVENOUS | Status: DC
  Administered 2021-07-26 – 2021-07-28 (×12): 4 10*6.[IU] via INTRAVENOUS
  Filled 2021-07-26 (×17): qty 4

## 2021-07-26 MED ORDER — HEPARIN (PORCINE) 25000 UT/250ML-% IV SOLN
1950.0000 [IU]/h | INTRAVENOUS | Status: DC
  Administered 2021-07-26: 1550 [IU]/h via INTRAVENOUS
  Administered 2021-07-27: 1650 [IU]/h via INTRAVENOUS
  Administered 2021-07-28 (×2): 1950 [IU]/h via INTRAVENOUS
  Filled 2021-07-26 (×5): qty 250

## 2021-07-26 MED ORDER — SODIUM CHLORIDE 0.9 % IV SOLN: INTRAVENOUS | Status: DC

## 2021-07-26 NOTE — Progress Notes (Signed)
PROGRESS NOTE    [name] [name]  ASN:[id number] DOB: December 06, 1950 DOA: 07/25/2021 PCP: [name], FNP    Chief Complaint  Patient presents with   Hypotension    Brief Narrative:   [name] [name] is a 71 y.o. male with medical history significant of paroxysmal atrial fibrillation, h/o NSTEMI with coronary artery disease with multiple stents, diabetes, diastolic heart failure, peripheral artery occlusive disease with recent femoropopliteal bypass with multiple skin ulcerations and necrotic areas on feet bilaterally.  Patient sent to the emergency department from family medicine office due to concerns for sepsis.  Patient has had severe fatigue and fever and rigors over the past 3 days with worsening lower extremity pain left greater than right.  He has multiple ulcerations that have been weeping and oozing.  Patient was mildly hypotensive on arrival with tachycardia.  This improved with IV fluids.  Patient was started on vancomycin.  X-rays obtained showed no x-Everhett evidence of osteomyelitis.   Assessment & Plan:   Principal Problem:   Leg wound, left Active Problems:   Hypertension associated with diabetes (HCC)   GERD (gastroesophageal reflux disease)   Diabetic peripheral neuropathy associated with type 2 diabetes mellitus (HCC)   Diabetic neuropathy (HCC)   Diabetes (HCC)   Morbid obesity (HCC)   CHF (congestive heart failure) (HCC)   PAF (paroxysmal atrial fibrillation) (HCC)   S/P femoral-popliteal bypass surgery   PAOD (peripheral arterial occlusive disease) ([place])  Bilateral lower extremity wound with bacteremia  -With wounds appear to be infected, with blood culture growing gram-positive cocci in chains. -Follow on final results of blood cultures -Narrowed antibiotic to cultures, currently of vancomycin, cefepime and Flagyl, and started on penicillin G -Evidence of osteomyelitis on left foot MRI  Peripheral artery occlusive disease status post femoropopliteal  bypass -Continue with Eliquis and Plavix -Management per vascular surgery  Paroxysmal atrial fibrillation on anticoagulation -Continue with Eliquis for anticoagulation -Currently normal sinus rhythm -Continue with Toprol-XL  Diabetes with nephropathy - Continue home insulin with sliding scale - Continue gabapentin - Continue with Farxiga  Morbid obesity  Hypertension -Continue with Imdur and metoprolol  Hyponatremia -We will start on IV normal saline  Hyperkalemia -Recheck in a.m.  Hypertension -Continue with Imdur and metoprolol  AKI in CKD stage IIIa -Creatinine is up today to 1.9, will avoid nephrotoxic medications, hold Lasix and will start on IV fluids.   DVT prophylaxis: Eliquis Code Status: Full Family Communication: wife at bedside Disposition:   Status is: Inpatient Remains inpatient appropriate because: IV ABX   Consultants:  Vascular surgery  Subjective:  Patient denies any chest pain, shortness of breath, he complains of lower extremity pain.  Objective: Vitals:   07/25/21 2309 07/26/21 0317 07/26/21 0820 07/26/21 1203  BP: 129/70 121/82 (!) 164/78 121/78  Pulse: 94 92 90 97  Resp: '20 16 16 18  '$ Temp: 97.7 F (36.5 C) 98.4 F (36.9 C) 98.2 F (36.8 C) 98.4 F (36.9 C)  TempSrc: Oral Oral Oral Oral  SpO2: 94% 92% 96% 96%  Weight:      Height:        Intake/Output Summary (Last 24 hours) at 07/26/2021 1319 Last data filed at 07/26/2021 1203 Gross per 24 hour  Intake 560.23 ml  Output 1500 ml  Net -939.77 ml   Filed Weights   07/25/21 1313 07/25/21 1947  Weight: 131.5 kg 131.5 kg    Examination:   Awake Alert, Oriented X 3, No new F.N deficits, Normal affect Symmetrical Chest  wall movement, Good air movement bilaterally, CTAB RRR,No Gallops,Rubs or new Murmurs, No Parasternal Heave +ve B.Sounds, Abd Soft, No tenderness, No rebound - guarding or rigidity. Ischemic changes to the right first and second toe, and the left first,  second and third toes, left heel ulcer.     Data Reviewed: I have personally reviewed following labs and imaging studies  CBC: Recent Labs  Lab 07/25/21 1359 07/26/21 0114  WBC 8.5 12.8*  NEUTROABS 6.5  --   HGB 14.8 14.2  HCT 50.2 46.4  MCV 84.1 82.9  PLT 271 073    Basic Metabolic Panel: Recent Labs  Lab 07/25/21 1359 07/26/21 0114  NA 134* 131*  K 4.2 5.2*  CL 95* 94*  CO2 27 24  GLUCOSE 136* 171*  BUN 22 21  CREATININE 1.57* 1.91*  CALCIUM 10.1 9.5    GFR: Estimated Creatinine Clearance: 52.6 mL/min (A) (by C-G formula based on SCr of 1.91 mg/dL (H)).  Liver Function Tests: Recent Labs  Lab 07/25/21 1359  AST 29  ALT 22  ALKPHOS 41  BILITOT 0.9  PROT 8.6*  ALBUMIN 4.4    CBG: Recent Labs  Lab 07/25/21 1943 07/26/21 0053 07/26/21 0756 07/26/21 1154  GLUCAP 137* 172* 179* 170*     Recent Results (from the past 240 hour(s))  Microscopic Examination     Status: None   Collection Time: 07/25/21 11:30 AM   Urine  Result Value Ref Range Status   WBC, UA None seen 0 - 5 /hpf Final   RBC 0-2 0 - 2 /hpf Final   Epithelial Cells (non renal) 0-10 0 - 10 /hpf Final   Renal Epithel, UA None seen None seen /hpf Final   Bacteria, UA None seen None seen/Few Final  Blood culture (routine x 2)     Status: None (Preliminary result)   Collection Time: 07/25/21  2:00 PM   Specimen: BLOOD RIGHT ARM  Result Value Ref Range Status   Specimen Description   Final    BLOOD RIGHT ARM BOTTLES DRAWN AEROBIC AND ANAEROBIC   Special Requests Blood Culture adequate volume  Final   Culture   Final    NO GROWTH < 24 HOURS Performed at [facility], [address], [place], [place] [address]    Report Status PENDING  Incomplete  Blood culture (routine x 2)     Status: None (Preliminary result)   Collection Time: 07/25/21  2:01 PM   Specimen: Left Antecubital; Blood  Result Value Ref Range Status   Specimen Description   Final    LEFT ANTECUBITAL BOTTLES DRAWN  AEROBIC AND ANAEROBIC Performed at [facility], [address], [place], [place] [address]    Special Requests   Final    Blood Culture adequate volume Performed at [facility], [address] [place], [place] [address]    Culture  Setup Time   Final    GRAM POSITIVE COCCI IN CHAINS Gram Stain Report Called to,Read Back By and Verified With: DOBY,J @ 0320 ON 07/26/21 BY JUW AEROBIC BOTTLE ONLY GS DONE @ [facility] CRITICAL RESULT CALLED TO, READ BACK BY AND VERIFIED WITH: G,BARR PHARMD '@0810'$  07/26/21 EB Performed at [facility], [place] [address], [place], [place] [address]    Culture [facility] POSITIVE COCCI  Final   Report Status PENDING  Incomplete  Blood Culture ID Panel (Reflexed)     Status: Abnormal   Collection Time: 07/25/21  2:01 PM  Result Value Ref Range Status  Enterococcus faecalis NOT DETECTED NOT DETECTED Final   Enterococcus Faecium NOT DETECTED NOT DETECTED Final   Listeria monocytogenes NOT DETECTED NOT DETECTED Final   Staphylococcus species NOT DETECTED NOT DETECTED Final   Staphylococcus aureus (BCID) NOT DETECTED NOT DETECTED Final   Staphylococcus epidermidis NOT DETECTED NOT DETECTED Final   Staphylococcus lugdunensis NOT DETECTED NOT DETECTED Final   Streptococcus species DETECTED (A) NOT DETECTED Final    Comment: CRITICAL RESULT CALLED TO, READ BACK BY AND VERIFIED WITH: G BARR PHARMD '@0811'$  07/26/21 EB    Streptococcus agalactiae DETECTED (A) NOT DETECTED Final    Comment: CRITICAL RESULT CALLED TO, READ BACK BY AND VERIFIED WITH: G BARR PHARMD '@0811'$  07/26/21 EB    Streptococcus pneumoniae NOT DETECTED NOT DETECTED Final   Streptococcus pyogenes NOT DETECTED NOT DETECTED Final   A.calcoaceticus-baumannii NOT DETECTED NOT DETECTED Final   Bacteroides fragilis NOT DETECTED NOT DETECTED Final   Enterobacterales NOT DETECTED NOT DETECTED Final   Enterobacter cloacae complex NOT DETECTED NOT DETECTED Final   Escherichia coli NOT DETECTED NOT DETECTED Final    Klebsiella aerogenes NOT DETECTED NOT DETECTED Final   Klebsiella oxytoca NOT DETECTED NOT DETECTED Final   Klebsiella pneumoniae NOT DETECTED NOT DETECTED Final   Proteus species NOT DETECTED NOT DETECTED Final   Salmonella species NOT DETECTED NOT DETECTED Final   Serratia marcescens NOT DETECTED NOT DETECTED Final   Haemophilus influenzae NOT DETECTED NOT DETECTED Final   Neisseria meningitidis NOT DETECTED NOT DETECTED Final   Pseudomonas aeruginosa NOT DETECTED NOT DETECTED Final   Stenotrophomonas maltophilia NOT DETECTED NOT DETECTED Final   Candida albicans NOT DETECTED NOT DETECTED Final   Candida auris NOT DETECTED NOT DETECTED Final   Candida glabrata NOT DETECTED NOT DETECTED Final   Candida krusei NOT DETECTED NOT DETECTED Final   Candida parapsilosis NOT DETECTED NOT DETECTED Final   Candida tropicalis NOT DETECTED NOT DETECTED Final   Cryptococcus neoformans/gattii NOT DETECTED NOT DETECTED Final    Comment: Performed at [facility], [address], [place], [place] [address]  Resp Panel by RT-PCR (Flu A&B, Covid) Nasopharyngeal Swab     Status: None   Collection Time: 07/25/21  3:01 PM   Specimen: Nasopharyngeal Swab; Nasopharyngeal(NP) swabs in vial transport medium  Result Value Ref Range Status   SARS Coronavirus 2 by RT PCR NEGATIVE NEGATIVE Final    Comment: (NOTE) SARS-CoV-2 target nucleic acids are NOT DETECTED.  The SARS-CoV-2 RNA is generally detectable in upper respiratory specimens during the acute phase of infection. The lowest concentration of SARS-CoV-2 viral copies this assay can detect is 138 copies/mL. A negative result does not preclude SARS-Cov-2 infection and should not be used as the sole basis for treatment or other patient management decisions. A negative result may occur with  improper specimen collection/handling, submission of specimen other than nasopharyngeal swab, presence of viral mutation(s) within the areas targeted by this  assay, and inadequate number of viral copies(<138 copies/mL). A negative result must be combined with clinical observations, patient history, and epidemiological information. The expected result is Negative.  Fact Sheet for Patients:  EntrepreneurPulse.com.au  Fact Sheet for Healthcare Providers:  IncredibleEmployment.be  This test is no t yet approved or cleared by the [place] FDA and  has been authorized for detection and/or diagnosis of SARS-CoV-2 by FDA under an Emergency Use Authorization (EUA). This EUA will remain  in effect (meaning this test can be used) for the duration of the COVID-19 declaration under Section  564(b)(1) of the Act, 21 U.S.C.section 360bbb-3(b)(1), unless the authorization is terminated  or revoked sooner.       Influenza A by PCR NEGATIVE NEGATIVE Final   Influenza B by PCR NEGATIVE NEGATIVE Final    Comment: (NOTE) The Xpert Xpress SARS-CoV-2/FLU/RSV plus assay is intended as an aid in the diagnosis of influenza from Nasopharyngeal swab specimens and should not be used as a sole basis for treatment. Nasal washings and aspirates are unacceptable for Xpert Xpress SARS-CoV-2/FLU/RSV testing.  Fact Sheet for Patients: EntrepreneurPulse.com.au  Fact Sheet for Healthcare Providers: IncredibleEmployment.be  This test is not yet approved or cleared by the [place] FDA and has been authorized for detection and/or diagnosis of SARS-CoV-2 by FDA under an Emergency Use Authorization (EUA). This EUA will remain in effect (meaning this test can be used) for the duration of the COVID-19 declaration under Section 564(b)(1) of the Act, 21 U.S.C. section 360bbb-3(b)(1), unless the authorization is terminated or revoked.  Performed at [facility], [address], [place], [place] [address]          Radiology Studies: MR FOOT LEFT W WO CONTRAST  Result Date:  07/25/2021 CLINICAL DATA:  Diffuse left foot pain for 2 months. No known injury. EXAM: MRI OF THE LEFT FOREFOOT WITHOUT AND WITH CONTRAST TECHNIQUE: Multiplanar, multisequence MR imaging of the left foot was performed both before and after administration of intravenous contrast. CONTRAST:  61m GADAVIST GADOBUTROL 1 MMOL/ML IV SOLN COMPARISON:  X-Nizar 07/25/2021 FINDINGS: Technical Note: Despite efforts by the technologist and patient, motion artifact is present on today's exam and could not be eliminated. This reduces exam sensitivity and specificity. Bones/Joint/Cartilage No acute fracture. No dislocation. Remote healed fractures of the second and third metatarsal diaphyses. Artifact near the edge of the field of view results in poor fat saturation of the toes on T2 weighted images. In correlation with sister sequences. No definite sites of bone marrow edema. No erosion or abnormal marrow enhancement. No evidence of marrow replacement. Degenerative changes of the foot are most pronounced at the first MTP joint. No effusion. Ligaments No evidence of acute ligamentous injury. Muscles and Tendons Denervation changes of the intrinsic foot musculature. Flexor and extensor tendons appear intact. No tenosynovitis. Soft tissues Subcutaneous edema most pronounced along the dorsum of the forefoot. No deep soft tissue ulceration. No organized fluid collection. IMPRESSION: 1. Limited exam. 2. Subcutaneous edema most pronounced along the dorsum of the forefoot, nonspecific but could represent cellulitis in the appropriate clinical setting. No organized fluid collection. 3. No acute osseous abnormality. No evidence of osteomyelitis. 4. Remote healed fractures of the second and third metatarsal diaphyses. Electronically Signed   By: [name]   On: 07/25/2021 17:49   DG Foot Complete Left  Result Date: 07/25/2021 CLINICAL DATA:  Pain and swelling EXAM: LEFT FOOT - COMPLETE 3+ VIEW COMPARISON:  None. FINDINGS:  There is generalized soft tissue swelling of the foot. There is a there is no frank bony destruction. Evidence of old second and third metatarsal injuries. Plantar dorsal calcaneal spurring. Vascular calcifications. IMPRESSION: Soft tissue swelling.  No acute osseous abnormality. Electronically Signed   By: JMaurine SimmeringM.D.   On: 07/25/2021 15:27   DG Foot Complete Right  Result Date: 07/25/2021 CLINICAL DATA:  Pain and swelling EXAM: RIGHT FOOT COMPLETE - 3+ VIEW COMPARISON:  None. FINDINGS: There is generalized soft tissue swelling of the foot. There is no frank bony destruction. Plantar dorsal calcaneal spurring. Vascular calcifications. IMPRESSION: Soft  tissue swelling.  No acute osseous abnormality. Electronically Signed   By: Maurine Simmering M.D.   On: 07/25/2021 15:28   VAS [place] ABI WITH/WO TBI  Result Date: 07/26/2021  LOWER EXTREMITY DOPPLER STUDY Patient Name:  CHARVIS LIGHTNER  Date of Exam:   07/26/2021 Medical Rec #: [id number]     Accession #:    [id number] Date of Birth: [birth year]     Patient Gender: M Patient Age:   75 years Exam Location:  [facility] Procedure:      VAS [place] ABI WITH/WO TBI Referring Phys: Monica Martinez --------------------------------------------------------------------------------  Indications: Ulceration, and peripheral artery disease. High Risk         Hypertension, hyperlipidemia, Diabetes, coronary artery Factors:          disease. Other Factors: Atrial fibrillation.  Vascular Interventions: Left CFA to Above knee popliteal bypass 11/22/20, Right                         SFA stent 11/31/2020 and 12/02/20. Comparison Study: Prior ABI done 12/20/20 Performing Technologist: Sharion Dove RVS  Examination Guidelines: A complete evaluation includes at minimum, Doppler waveform signals and systolic blood pressure reading at the level of bilateral brachial, anterior tibial, and posterior tibial arteries, when vessel segments are accessible. Bilateral testing is considered an  integral part of a complete examination. Photoelectric Plethysmograph (PPG) waveforms and toe systolic pressure readings are included as required and additional duplex testing as needed. Limited examinations for reoccurring indications may be performed as noted.  ABI Findings: +---------+------------------+-----+-----------+--------+  Right     Rt Pressure (mmHg) Index Waveform    Comment   +---------+------------------+-----+-----------+--------+  Brachial  100                      multiphasic           +---------+------------------+-----+-----------+--------+  PTA       87                 0.78  monophasic            +---------+------------------+-----+-----------+--------+  DP        42                 0.38  monophasic            +---------+------------------+-----+-----------+--------+  Great Toe 74                 0.67                        +---------+------------------+-----+-----------+--------+ +---------+------------------+-----+-------------------+-------+  Left      Lt Pressure (mmHg) Index Waveform            Comment  +---------+------------------+-----+-------------------+-------+  Brachial  111                      multiphasic                  +---------+------------------+-----+-------------------+-------+  PTA       59                 0.53  monophasic                   +---------+------------------+-----+-------------------+-------+  DP        78                 0.70  dampened monophasic          +---------+------------------+-----+-------------------+-------+  Great Toe 18                 0.16                               +---------+------------------+-----+-------------------+-------+ +-------+-----------+-----------+------------+------------+  ABI/TBI Today's ABI Today's TBI Previous ABI Previous TBI  +-------+-----------+-----------+------------+------------+  Right   0.78        0.67        0.57         0.39          +-------+-----------+-----------+------------+------------+  Left    0.70         0.16        0.66         0.67          +-------+-----------+-----------+------------+------------+  Bilateral ABIs appear essentially unchanged compared to prior study on 12/20/20. Left TBIs appear decreased compared to prior study on 12/20/20. Right TBI appears increased since study done 12/20/20  Summary: Right: Resting right ankle-brachial index indicates moderate right lower extremity arterial disease. The right toe-brachial index is abnormal. Left: Resting left ankle-brachial index indicates moderate left lower extremity arterial disease. The left toe-brachial index is abnormal.  *See table(s) above for measurements and observations.     Preliminary    VAS [place] LOWER EXTREMITY ARTERIAL DUPLEX  Result Date: 07/26/2021 LOWER EXTREMITY ARTERIAL DUPLEX STUDY Patient Name:  RYKER SUDBURY  Date of Exam:   07/26/2021 Medical Rec #: [id number]     Accession #:    [id number] Date of Birth: May 21, 1950     Patient Gender: M Patient Age:   59 years Exam Location:  [facility] Procedure:      VAS [place] LOWER EXTREMITY ARTERIAL DUPLEX Referring Phys: Monica Martinez --------------------------------------------------------------------------------  Indications: Ulceration, and peripheral artery disease. High Risk Factors: Hypertension, hyperlipidemia, Diabetes, coronary artery                    disease. Other Factors: Atrial fibrillation.  Vascular Interventions: Left CFA to Above knee popliteal bypass 11/22/20,                         Right SFA stent 11/31/2020 and 12/02/20. Current ABI:            R:0.78 L:0.70 Limitations: Body habitus Comparison Study: Prior study done 12/20/20 indicating patent Right SFA and left                   bypass graft Performing Technologist: Sharion Dove RVS  Examination Guidelines: A complete evaluation includes B-mode imaging, spectral Doppler, color Doppler, and power Doppler as needed of all accessible portions of each vessel. Bilateral testing is considered an integral part of a complete  examination. Limited examinations for reoccurring indications may be performed as noted.  +-----------+--------+-----+--------+----------+--------+  LEFT        PSV cm/s Ratio Stenosis Waveform   Comments  +-----------+--------+-----+--------+----------+--------+  SFA Distal  17                      monophasic           +-----------+--------+-----+--------+----------+--------+  POP Prox    20                      monophasic           +-----------+--------+-----+--------+----------+--------+  POP Distal  32  monophasic           +-----------+--------+-----+--------+----------+--------+  TP Trunk    35                      monophasic           +-----------+--------+-----+--------+----------+--------+  ATA Distal                 occluded                      +-----------+--------+-----+--------+----------+--------+  PTA Distal                 occluded                      +-----------+--------+-----+--------+----------+--------+  PERO Distal 32                      monophasic           +-----------+--------+-----+--------+----------+--------+  Summary: Left: Monophasic flow noted distal SFA, popliteal, and peroneal arteries. The posterior tibial and anterior tibial arteries appear occluded. Unable to adequately visualize bypass.  See table(s) above for measurements and observations.    Preliminary         Scheduled Meds:  apixaban  5 mg Oral BID   clopidogrel  75 mg Oral Daily   dapagliflozin propanediol  5 mg Oral QAC breakfast   DULoxetine  30 mg Oral Daily   ezetimibe  10 mg Oral Daily   fenofibrate  160 mg Oral Daily   gabapentin  300 mg Oral BID   insulin aspart  0-20 Units Subcutaneous TID WC   insulin aspart  0-5 Units Subcutaneous QHS   insulin aspart protamine- aspart  60 Units Subcutaneous BID WC   isosorbide mononitrate  30 mg Oral Daily   metoprolol succinate  50 mg Oral Daily   pantoprazole  40 mg Oral Daily   Continuous Infusions:  sodium chloride 75 mL/hr at  07/26/21 0834   pencillin G potassium IV 4 Million Units (07/26/21 1159)     LOS: 1 day      Phillips Climes, MD [facility]   To contact the attending provider between 7A-7P or the covering provider during after hours 7P-7A, please log into the web site [email or web address] and access using universal [place] password for that web site. If you do not have the password, please call the hospital operator.  07/26/2021, 1:19 PM

## 2021-07-26 NOTE — Progress Notes (Signed)
?Progress Note ? ? ? ?07/26/2021 ?2:08 PM ? ? ?Subjective: Numbness of both feet pain left medial thigh ? ?Vitals:  ? 07/26/21 0820 07/26/21 1203  ?BP: (!) 164/78 121/78  ?Pulse: 90 97  ?Resp: 16 18  ?Temp: 98.2 ?F (36.8 ?C) 98.4 ?F (36.9 ?C)  ?SpO2: 96% 96%  ? ? ?Physical Exam: ?Awake and alert ?Nonlabored respirations ?Tenderness to palpation left medial thigh without any palpable abnormalities or erythema ?Stable toe ulceration bilaterally ? ?CBC ?   ?Component Value Date/Time  ? WBC 12.8 (H) 07/26/2021 0114  ? RBC 5.60 07/26/2021 0114  ? HGB 14.2 07/26/2021 0114  ? HGB 11.7 (L) 05/27/2021 7902  ? HCT 46.4 07/26/2021 0114  ? HCT 40.8 05/27/2021 0938  ? PLT 273 07/26/2021 0114  ? PLT 335 05/27/2021 0938  ? MCV 82.9 07/26/2021 0114  ? MCV 74 (L) 05/27/2021 4097  ? MCH 25.4 (L) 07/26/2021 0114  ? MCHC 30.6 07/26/2021 0114  ? RDW 22.9 (H) 07/26/2021 0114  ? RDW 15.4 05/27/2021 0938  ? LYMPHSABS 1.1 07/25/2021 1359  ? LYMPHSABS 1.6 05/27/2021 0938  ? MONOABS 0.8 07/25/2021 1359  ? EOSABS 0.1 07/25/2021 1359  ? EOSABS 0.1 05/27/2021 0938  ? BASOSABS 0.1 07/25/2021 1359  ? BASOSABS 0.1 05/27/2021 0938  ? ? ?BMET ?   ?Component Value Date/Time  ? NA 131 (L) 07/26/2021 0114  ? NA 139 05/27/2021 0938  ? K 5.2 (H) 07/26/2021 0114  ? CL 94 (L) 07/26/2021 0114  ? CO2 24 07/26/2021 0114  ? GLUCOSE 171 (H) 07/26/2021 0114  ? BUN 21 07/26/2021 0114  ? BUN 23 05/27/2021 0938  ? CREATININE 1.91 (H) 07/26/2021 0114  ? CALCIUM 9.5 07/26/2021 0114  ? GFRNONAA 37 (L) 07/26/2021 0114  ? GFRAA 54 (L) 06/20/2020 1529  ? ? ?INR ?   ?Component Value Date/Time  ? INR 1.1 11/20/2020 0936  ? ? ? ?Intake/Output Summary (Last 24 hours) at 07/26/2021 1408 ?Last data filed at 07/26/2021 1203 ?Gross per 24 hour  ?Intake 560.23 ml  ?Output 1500 ml  ?Net -939.77 ml  ? ?ABI Findings:  ?+---------+------------------+-----+-----------+--------+  ?Right    Rt Pressure (mmHg)IndexWaveform   Comment    ?+---------+------------------+-----+-----------+--------+  ?Brachial 100                    multiphasic          ?+---------+------------------+-----+-----------+--------+  ?PTA      87                0.78 monophasic           ?+---------+------------------+-----+-----------+--------+  ?DP       42                0.38 monophasic           ?+---------+------------------+-----+-----------+--------+  ?Great Toe74                0.67                      ?+---------+------------------+-----+-----------+--------+  ? ?+---------+------------------+-----+-------------------+-------+  ?Left     Lt Pressure (mmHg)IndexWaveform           Comment  ?+---------+------------------+-----+-------------------+-------+  ?Brachial 111                    multiphasic                 ?+---------+------------------+-----+-------------------+-------+  ?PTA      61  0.53 monophasic                  ?+---------+------------------+-----+-------------------+-------+  ?DP       78                0.70 dampened monophasic         ?+---------+------------------+-----+-------------------+-------+  ?Great Toe18                0.16                             ?+---------+------------------+-----+-------------------+-------+  ? ?+-------+-----------+-----------+------------+------------+  ?ABI/TBIToday's ABIToday's TBIPrevious ABIPrevious TBI  ?+-------+-----------+-----------+------------+------------+  ?Right  0.78       0.67       0.57        0.39          ?+-------+-----------+-----------+------------+------------+  ?Left   0.70       0.16       0.66        0.67          ?+-------+-----------+-----------+------------+------------+  ? ?   ?Bilateral ABIs appear essentially unchanged compared to prior study on  ?12/20/20. Left TBIs appear decreased compared to prior study on 12/20/20.  ?Right TBI appears increased since study done 12/20/20  ?   ?Summary:  ?Right:  Resting right ankle-brachial index indicates moderate right lower  ?extremity arterial disease. The right toe-brachial index is abnormal.  ? ?Left: Resting left ankle-brachial index indicates moderate left lower  ?extremity arterial disease. The left toe-brachial index is abnormal.  ? ?LEFT       PSV cm/sRatioStenosisWaveform  Comments  ?+-----------+--------+-----+--------+----------+--------+  ?SFA Distal 17                   monophasic          ?+-----------+--------+-----+--------+----------+--------+  ?POP Prox   20                   monophasic          ?+-----------+--------+-----+--------+----------+--------+  ?POP Distal 32                   monophasic          ?+-----------+--------+-----+--------+----------+--------+  ?TP Trunk   35                   monophasic          ?+-----------+--------+-----+--------+----------+--------+  ?ATA Distal              occluded                    ?+-----------+--------+-----+--------+----------+--------+  ?PTA Distal              occluded                    ?+-----------+--------+-----+--------+----------+--------+  ?PERO Distal32                   monophasic          ?+-----------+--------+-----+--------+----------+--------+  ? ? ? ?Summary:  ?Left: Monophasic flow noted distal SFA, popliteal, and peroneal arteries.  ?The posterior tibial and anterior tibial arteries appear occluded. Unable  ?to adequately visualize bypass.  ? ?Assessment/plan:  71 y.o. male is here with bacteremia and stable bilateral foot ulceration.  The wounds do not appear to be the underlying cause will be continued on Plavix.  We will hold Eliquis in case of  need for procedure. Will discuss with Dr. [name] need for angiography probably early this week. ? ?Isao Seltzer C. Donzetta Matters, MD ?[facility] ?Office: [phone] ?Pager: [phone] ? ?07/26/2021 ?2:08 PM ? ?

## 2021-07-26 NOTE — Progress Notes (Signed)
VASCULAR LAB ? ? ? ?ABIs have been performed. ? ?See CV proc for preliminary results. ? ? ?Ariana Cavenaugh, RVT ?07/26/2021, 11:29 AM ? ?

## 2021-07-26 NOTE — Progress Notes (Signed)
Inpatient Diabetes Program Recommendations ? ?AACE/ADA: New Consensus Statement on Inpatient Glycemic Control  ? ?Target Ranges:  Prepandial:   less than 140 mg/dL ?     Peak postprandial:   less than 180 mg/dL (1-2 hours) ?     Critically ill patients:  140 - 180 mg/dL  ? ? Latest Reference Range & Units 07/26/21 00:53 07/26/21 07:56  ?Glucose-Capillary 70 - 99 mg/dL 172 (H) 179 (H)  ? ? Latest Reference Range & Units 05/27/21 09:26  ?HB A1C (BAYER DCA - WAIVED) 4.8 - 5.6 % 8.8 (H)  ? ?Review of Glycemic Control ? ?Diabetes history: DM2 ?Outpatient Diabetes medications: Humulin 70/30 60 units BID, Humalog 15-20 units TID with meals, Farxiga 5 mg daily,  Mounjaro 5 mg Qweek ?Current orders for Inpatient glycemic control: 70/30 60 units BID, Novolog 0-20 units TID with meals, Novolog 0-5 units QHS, Farxiga 5 mg QAM ? ?NOTE: Noted consult for Diabetes Coordinator. Diabetes Coordinator is not on campus over the weekend but available by pager from 8am to 5pm for questions or concerns. Per chart patient admitted with leg wounds bilaterally. Initial glucose 136 mg/dl on 07/25/21. Fasting glucose 179 mg/dl this morning and patient was given Novolog correction of 4 units and 70/30 60 units at 8:24 am today. Will follow along and make recommendations if needed based on glucose trends. ? ?Thanks, ?Barnie Alderman, RN, MSN, CDE ?Diabetes Coordinator ?Inpatient Diabetes Program ?[phone] (Team Pager from 8am to 5pm) ? ? ? ? ?

## 2021-07-26 NOTE — Progress Notes (Signed)
ANTICOAGULATION CONSULT NOTE - Initial Consult ? ?Pharmacy Consult for heparin dosing ?Indication: atrial fibrillation ? ?Allergies  ?Allergen Reactions  ? Shellfish Allergy Anaphylaxis and Other (See Comments)  ?  Tongue swelling, hives ?  ? Sulfa Antibiotics Anaphylaxis and Rash  ?  Tongue swelling, hives  ? Ace Inhibitors Other (See Comments) and Cough  ?  CKD, renal failure   ? Escitalopram Other (See Comments)  ?  Buzzing in ears,headache, felt like a zombie ?  ? Evolocumab Other (See Comments)  ?  Myalgias, flu like sx ?  ? Fenofibrate Other (See Comments)  ?  Body aches - pt currently taking isnt sure if its causing any pain ?  ? Invokana [Canagliflozin] Other (See Comments)  ?  Syncope / dehydration  ? Lisinopril Cough  ? Metformin And Related Itching  ? Pravastatin Sodium Other (See Comments)  ?  myalgias  ? Crestor [Rosuvastatin] Other (See Comments)  ?  Myalgias ?  ? Horse-Derived Products Rash  ? Lac Bovis Nausea Only  ?  Ties stomach in knots  ?  ? Lexapro [Escitalopram Oxalate] Other (See Comments)  ?  Buzzing in ears,headache, felt like a zombie  ? Lipitor [Atorvastatin] Other (See Comments)  ?  myalgias  ? Livalo [Pitavastatin] Other (See Comments)  ?  Myalgias ?  ? Tape Rash  ? ? ?Patient Measurements: ?Height: '6\' 3"'$  (190.5 cm) ?Weight: 131.5 kg (290 lb) ?IBW/kg (Calculated) : 84.5 ?Heparin Dosing Weight: 113.4kg ? ?Vital Signs: ?Temp: 98.4 ?F (36.9 ?C) (03/11 1203) ?Temp Source: Oral (03/11 1203) ?BP: 121/78 (03/11 1203) ?Pulse Rate: 97 (03/11 1203) ? ?Labs: ?Recent Labs  ?  07/25/21 ?1359 07/25/21 ?1607 07/26/21 ?0114  ?HGB 14.8  --  14.2  ?HCT 50.2  --  46.4  ?PLT 271  --  273  ?CREATININE 1.57*  --  1.91*  ?TROPONINIHS 52* 58*  --   ? ? ?Estimated Creatinine Clearance: 52.6 mL/min (A) (by C-G formula based on SCr of 1.91 mg/dL (H)). ? ? ?Medical History: ?Past Medical History:  ?Diagnosis Date  ? Anxiety   ? Arthritis   ? Atrial fibrillation ([place])   ? CAD (coronary artery disease)   ? a. 2010:  DES to CTO of RCA. EF 55% b. 07/2016: cath showing total occlusion within previously placed RCA stent (collaterals present), severe stenosis along LCx and OM1 (treated with 2 overlapping DES). c. repeat cath in 01/2018 showing patent stents along LCx and OM with CTO of D2, CTO of distal LCx, and CTO of RCA with collaterals present overall unchanged since 2018 with medical management recom  ? Cellulitis and abscess rt groin   ? Complication of anesthesia   ? " I woke up during a colonoscopy "     ? Depression   ? Diabetes mellitus   ? Diastolic CHF ([place])   ? Disorders of iron metabolism   ? Dysrhythmia   ? GERD (gastroesophageal reflux disease)   ? Hyperlipidemia   ? Hypertension   ? Low serum testosterone level   ? Medically noncompliant   ? Myocardial infarction [facility])   ? 05-23-20  ? ? ?Medications:  ?See MAR ? ?Assessment: ?Patient presented with hypotension, fatigue, and lower extremity pain from PCP on 07/25/21. He has multiple LE wounds that are oozing and weeping. He is on PTA Eliquis for afib that was continued inpatient. He also has PAD with recent femoropopliteal bypass.  ? ?Last dose of Eliquis 3/11 @ 0823. Switching to heparin per vascular  surgery in case patient will need operation. Will order baseline anti-Xa and aPTT ? ?Start heparin drip with no bolus at 2100 this evening since patient received Eliquis this morning.  ? ?Goal of Therapy:  ?Heparin level 0.3-0.7 units/ml ?aPTT 66-102 seconds ?Monitor platelets by anticoagulation protocol: Yes ?  ?Plan:  ?Start heparin infusion at 1550 units/hr ?Check anti-Xa level in 6 hours and daily while on heparin ?Continue to monitor H&H and platelets ? ?Donald Pore ?07/26/2021,3:21 PM ? ? ?

## 2021-07-26 NOTE — Progress Notes (Addendum)
PHARMACY - PHYSICIAN COMMUNICATION ?CRITICAL VALUE ALERT - BLOOD CULTURE IDENTIFICATION (BCID) ? ?[name] [name] is an 71 y.o. male who presented to [facility] on 07/25/2021 with a chief complaint of hypotension ? ?Assessment:  ?Patient presented to ED on 3/10 with concern for sepsis from family medicine office. Reports severe fatigue, fever, and rigors x3 days. He has had lower extremity pain, especially on the left side, with multiple ulcerations that have been weeping and oozing. Currently afebrile with WBC 12.8. SCr elevated at 1.91 with CrCl ~3m/min. Found to have GO'Brienin chains identified at group B strep in 1/4 (aerobic) bottles per BCID.  ? ?Name of physician (or Provider) Contacted:  ?Dr. [facility]? ?Current antibiotics:  ?Vancomycin ?Cefepime ?Metronidazole ? ?Changes to prescribed antibiotics:  ?Stop vancomycin, cefepime, and metronidazole ?Start penicillin G 4 million units IV q4h  ?Adjust penicillin frequency to q6h if CrCl falls below 55mmin ?Follow signs of clinical improvement and length of therapy ? ?Results for orders placed or performed during the hospital encounter of 07/25/21  ?Blood Culture ID Panel (Reflexed) (Collected: 07/25/2021  2:01 PM)  ?Result Value Ref Range  ? Enterococcus faecalis NOT DETECTED NOT DETECTED  ? Enterococcus Faecium NOT DETECTED NOT DETECTED  ? Listeria monocytogenes NOT DETECTED NOT DETECTED  ? Staphylococcus species NOT DETECTED NOT DETECTED  ? Staphylococcus aureus (BCID) NOT DETECTED NOT DETECTED  ? Staphylococcus epidermidis NOT DETECTED NOT DETECTED  ? Staphylococcus lugdunensis NOT DETECTED NOT DETECTED  ? Streptococcus species DETECTED (A) NOT DETECTED  ? Streptococcus agalactiae DETECTED (A) NOT DETECTED  ? Streptococcus pneumoniae NOT DETECTED NOT DETECTED  ? Streptococcus pyogenes NOT DETECTED NOT DETECTED  ? A.calcoaceticus-baumannii NOT DETECTED NOT DETECTED  ? Bacteroides fragilis NOT DETECTED NOT DETECTED  ? Enterobacterales NOT DETECTED NOT DETECTED  ?  Enterobacter cloacae complex NOT DETECTED NOT DETECTED  ? Escherichia coli NOT DETECTED NOT DETECTED  ? Klebsiella aerogenes NOT DETECTED NOT DETECTED  ? Klebsiella oxytoca NOT DETECTED NOT DETECTED  ? Klebsiella pneumoniae NOT DETECTED NOT DETECTED  ? Proteus species NOT DETECTED NOT DETECTED  ? Salmonella species NOT DETECTED NOT DETECTED  ? Serratia marcescens NOT DETECTED NOT DETECTED  ? Haemophilus influenzae NOT DETECTED NOT DETECTED  ? Neisseria meningitidis NOT DETECTED NOT DETECTED  ? Pseudomonas aeruginosa NOT DETECTED NOT DETECTED  ? Stenotrophomonas maltophilia NOT DETECTED NOT DETECTED  ? Candida albicans NOT DETECTED NOT DETECTED  ? Candida auris NOT DETECTED NOT DETECTED  ? Candida glabrata NOT DETECTED NOT DETECTED  ? Candida krusei NOT DETECTED NOT DETECTED  ? Candida parapsilosis NOT DETECTED NOT DETECTED  ? Candida tropicalis NOT DETECTED NOT DETECTED  ? Cryptococcus neoformans/gattii NOT DETECTED NOT DETECTED  ? ? ?[name] Pore3/03/2022  8:30 AM ? ?

## 2021-07-26 NOTE — Progress Notes (Signed)
VASCULAR LAB ? ? ? ?Left lower extremity arterial duplex has been performed. ? ?See CV proc for preliminary results. ? ? ?Baltasar Twilley, RVT ?07/26/2021, 11:29 AM ? ?

## 2021-07-27 DIAGNOSIS — I779 Disorder of arteries and arterioles, unspecified: Secondary | ICD-10-CM

## 2021-07-27 LAB — CBC
HCT: 45 % (ref 39.0–52.0)
Hemoglobin: 13.9 g/dL (ref 13.0–17.0)
MCH: 25.4 pg — ABNORMAL LOW (ref 26.0–34.0)
MCHC: 30.9 g/dL (ref 30.0–36.0)
MCV: 82.3 fL (ref 80.0–100.0)
Platelets: 242 10*3/uL (ref 150–400)
RBC: 5.47 MIL/uL (ref 4.22–5.81)
RDW: 22.6 % — ABNORMAL HIGH (ref 11.5–15.5)
WBC: 8.8 10*3/uL (ref 4.0–10.5)
nRBC: 0 % (ref 0.0–0.2)

## 2021-07-27 LAB — HEPARIN LEVEL (UNFRACTIONATED): Heparin Unfractionated: >1.1 IU/mL — ABNORMAL HIGH (ref 0.30–0.70)

## 2021-07-27 LAB — BASIC METABOLIC PANEL
Anion gap: 11 (ref 5–15)
BUN: 28 mg/dL — ABNORMAL HIGH (ref 8–23)
CO2: 24 mmol/L (ref 22–32)
Calcium: 9 mg/dL (ref 8.9–10.3)
Chloride: 95 mmol/L — ABNORMAL LOW (ref 98–111)
Creatinine, Ser: 1.94 mg/dL — ABNORMAL HIGH (ref 0.61–1.24)
GFR, Estimated: 37 mL/min — ABNORMAL LOW (ref >60)
Glucose, Bld: 166 mg/dL — ABNORMAL HIGH (ref 70–99)
Potassium: 4 mmol/L (ref 3.5–5.1)
Sodium: 130 mmol/L — ABNORMAL LOW (ref 135–145)

## 2021-07-27 LAB — GLUCOSE, CAPILLARY
Glucose-Capillary: 171 mg/dL — ABNORMAL HIGH (ref 70–99)
Glucose-Capillary: 187 mg/dL — ABNORMAL HIGH (ref 70–99)
Glucose-Capillary: 199 mg/dL — ABNORMAL HIGH (ref 70–99)
Glucose-Capillary: 180 mg/dL — ABNORMAL HIGH (ref 70–99)

## 2021-07-27 LAB — APTT
aPTT: 65 s — ABNORMAL HIGH (ref 24–36)
aPTT: 51 seconds — ABNORMAL HIGH (ref 24–36)
aPTT: 70 s — ABNORMAL HIGH (ref 24–36)

## 2021-07-27 MED ORDER — ENSURE MAX PROTEIN PO LIQD
11.0000 [oz_av] | Freq: Two times a day (BID) | ORAL | Status: DC
  Administered 2021-07-27 – 2021-07-28 (×3): 11 [oz_av] via ORAL
  Filled 2021-07-27 (×5): qty 330

## 2021-07-27 MED ORDER — ADULT MULTIVITAMIN W/MINERALS CH
1.0000 | ORAL_TABLET | Freq: Every day | ORAL | Status: DC
  Administered 2021-07-27 – 2021-07-28 (×2): 1 via ORAL
  Filled 2021-07-27 (×2): qty 1

## 2021-07-27 MED ORDER — JUVEN PO PACK
1.0000 | PACK | Freq: Two times a day (BID) | ORAL | Status: DC
Start: 2021-07-28 — End: 2021-07-29
  Administered 2021-07-28 (×2): 1 via ORAL
  Filled 2021-07-27 (×2): qty 1

## 2021-07-27 NOTE — Progress Notes (Signed)
Initial Nutrition Assessment ? ?DOCUMENTATION CODES:  ?Obesity unspecified ? ?INTERVENTION:  ?Liberalize diet to carb modified with no salt packets on tray.  ?Ensure Max po BID, each supplement provides 150 kcal and 30 grams of protein.  ?MVI with minerals daily ?1 packet Juven BID, each packet provides 95 calories, 2.5 grams of protein (collagen) + micronutrients for wound healing ? ?NUTRITION DIAGNOSIS:  ?Increased nutrient needs related to wound healing as evidenced by estimated needs. ? ?GOAL:  ?Patient will meet greater than or equal to 90% of their needs ? ?MONITOR:  ?PO intake, Supplement acceptance, Skin, Labs ? ?REASON FOR ASSESSMENT:  ?Consult ?Wound healing ? ?ASSESSMENT:  ?71 y.o. male with hx of A-fib, PAD S/P femoral bypass, HTN, HLD, CHF, GERD, hx MI 2022, DM type 2, and CAD presented to ED from PCP with symptoms concerning for sepsis related to BLE wounds. ? ?Pt followed closely by outpatient vascular surgery team. ABIs obtained 3/11. Pt currently being monitored for the need for angiogram this week.  ? ?No intake recorded this admission, but noted pt reported feeling poorly for several days PTA, likely with decreased appetite. Weight hx appears stable. ? ?Will liberalize diet to carb modified to assist with intake and hyponatremia. Will also add supplements to aid in wound healing. Will monitor treatment plan for the needs to modify nutrition interventions.  ? ?Nutritionally Relevant Medications: ?Scheduled Meds: ? ezetimibe  10 mg Oral Daily  ? fenofibrate  160 mg Oral Daily  ? insulin aspart  0-20 Units Subcutaneous TID WC  ? insulin aspart  0-5 Units Subcutaneous QHS  ? insulin aspart protamine- aspart  60 Units Subcutaneous BID WC  ? pantoprazole  40 mg Oral Daily  ? ?Continuous Infusions: ? sodium chloride 100 mL/hr at 07/27/21 1126  ? pencillin G potassium IV 4 Million Units (07/27/21 1228)  ? ?PRN Meds: linaclotide, ondansetron ? ?Labs Reviewed: ?Sodium 130, chloride 95 ?creatinine 1.94, BUN  28 ?CBG ranges from 170-194 mg/dL over the last 24 hours ?HgbA1c 8.1% (7/1) ? ?NUTRITION - FOCUSED PHYSICAL EXAM: ?Defer to in-person assessment ? ?Diet Order:   ?Diet Order   ? ?       ?  Diet heart healthy/carb modified Room service appropriate? Yes; Fluid consistency: Thin  Diet effective now       ?  ? ?  ?  ? ?  ? ? ?EDUCATION NEEDS:  ?No education needs have been identified at this time ? ?Skin:  Skin Assessment: Reviewed RN Assessment (venous stasis ulcers to the left anterior foot, right anterior foot, and left pretibial area) ? ?Last BM:  prior to admission ? ?Height:  ?Ht Readings from Last 1 Encounters:  ?07/25/21 '6\' 3"'$  (1.905 m)  ? ? ?Weight:  ?Wt Readings from Last 1 Encounters:  ?07/25/21 131.5 kg  ? ? ?Ideal Body Weight:  89.1 kg ? ?BMI:  Body mass index is 36.25 kg/m?. ? ?Estimated Nutritional Needs:  ?Kcal:  2400-2600 kcal/d ?Protein:  125-140g/d ?Fluid:  2.5-2.7 L/d ? ? ?Ranell Patrick, RD, LDN ?Clinical Dietitian ?RD pager # available in [place]  ?After hours/weekend pager # available in [place] ?

## 2021-07-27 NOTE — Progress Notes (Addendum)
ANTICOAGULATION CONSULT NOTE - Follow-Up ? ?Pharmacy Consult for heparin dosing ?Indication: atrial fibrillation ? ?Allergies  ?Allergen Reactions  ? Shellfish Allergy Anaphylaxis and Other (See Comments)  ?  Tongue swelling, hives ?  ? Sulfa Antibiotics Anaphylaxis and Rash  ?  Tongue swelling, hives  ? Ace Inhibitors Other (See Comments) and Cough  ?  CKD, renal failure   ? Escitalopram Other (See Comments)  ?  Buzzing in ears,headache, felt like a zombie ?  ? Evolocumab Other (See Comments)  ?  Myalgias, flu like sx ?  ? Fenofibrate Other (See Comments)  ?  Body aches - pt currently taking isnt sure if its causing any pain ?  ? Invokana [Canagliflozin] Other (See Comments)  ?  Syncope / dehydration  ? Lisinopril Cough  ? Metformin And Related Itching  ? Pravastatin Sodium Other (See Comments)  ?  myalgias  ? Crestor [Rosuvastatin] Other (See Comments)  ?  Myalgias ?  ? Horse-Derived Products Rash  ? Lac Bovis Nausea Only  ?  Ties stomach in knots  ?  ? Lexapro [Escitalopram Oxalate] Other (See Comments)  ?  Buzzing in ears,headache, felt like a zombie  ? Lipitor [Atorvastatin] Other (See Comments)  ?  myalgias  ? Livalo [Pitavastatin] Other (See Comments)  ?  Myalgias ?  ? Tape Rash  ? ? ?Patient Measurements: ?Height: '6\' 3"'$  (190.5 cm) ?Weight: 131.5 kg (290 lb) ?IBW/kg (Calculated) : 84.5 ?Heparin Dosing Weight: 113.4kg ? ?Vital Signs: ?Temp: 98.8 ?F (37.1 ?C) (03/12 1600) ?Temp Source: Oral (03/12 1600) ?BP: 115/76 (03/12 1600) ?Pulse Rate: 85 (03/12 1600) ? ?Labs: ?Recent Labs  ?  07/25/21 ?1359 07/25/21 ?1607 07/26/21 ?0114 07/26/21 ?1628 07/27/21 ?9147 07/27/21 ?8295  ?HGB 14.8  --  14.2  --  13.9  --   ?HCT 50.2  --  46.4  --  45.0  --   ?PLT 271  --  273  --  242  --   ?APTT  --   --   --  53* 65* 51*  ?HEPARINUNFRC  --   --   --  >1.10* >1.10*  --   ?CREATININE 1.57*  --  1.91*  --  1.94*  --   ?TROPONINIHS 52* 58*  --   --   --   --   ? ? ? ?Estimated Creatinine Clearance: 51.8 mL/min (A) (by C-G formula  based on SCr of 1.94 mg/dL (H)). ? ? ?Medical History: ?Past Medical History:  ?Diagnosis Date  ? Anxiety   ? Arthritis   ? Atrial fibrillation ([place])   ? CAD (coronary artery disease)   ? a. 2010: DES to CTO of RCA. EF 55% b. 07/2016: cath showing total occlusion within previously placed RCA stent (collaterals present), severe stenosis along LCx and OM1 (treated with 2 overlapping DES). c. repeat cath in 01/2018 showing patent stents along LCx and OM with CTO of D2, CTO of distal LCx, and CTO of RCA with collaterals present overall unchanged since 2018 with medical management recom  ? Cellulitis and abscess rt groin   ? Complication of anesthesia   ? " I woke up during a colonoscopy "     ? Depression   ? Diabetes mellitus   ? Diastolic CHF ([place])   ? Disorders of iron metabolism   ? Dysrhythmia   ? GERD (gastroesophageal reflux disease)   ? Hyperlipidemia   ? Hypertension   ? Low serum testosterone level   ? Medically noncompliant   ?  Myocardial infarction [facility])   ? 05-23-20  ? ? ?Medications:  ?See MAR ? ?Assessment: ?30 yom presenting with hypotension, fatigue, and lower extremity pain from PCP on 07/25/21. He has multiple LE wounds that are oozing and weeping. He also has PAD with recent femoropopliteal bypass. ? ?Patient on Eliquis for afib that was initially continued inpatient.  ?Last dose of Eliquis 3/11 @ 0823. Switched to heparin per vascular surgery in case patient will need operation. Dose adjustments per aPTT until it correlates with anti-Xa level.  ? ?aPTT subtherapeutic this AM at 65 and heparin infusion increased to 1650 units/hr. ? ?aPTT remains subtherapeutic on repeat at 51. No infusion issues or signs of bleeding noted. H/H stable. ? ?Goal of Therapy:  ?Heparin level 0.3-0.7 units/ml ?aPTT 66-102 seconds ?Monitor platelets by anticoagulation protocol: Yes ?  ?Plan:  ?Increase heparin infusion to 1950 units/hr ?Check anti-Xa level in 6 hours and daily while on heparin ?Check aPTT level with anti-Xa  until the values correlate ?Continue to monitor H&H and platelets ? ?Lorelei Pont, PharmD, BCPS ?07/27/2021 4:48 PM ?ED Clinical Pharmacist -  [phone] ? ? ? ?

## 2021-07-27 NOTE — Plan of Care (Signed)

## 2021-07-27 NOTE — Progress Notes (Addendum)
ANTICOAGULATION CONSULT NOTE - Initial Consult ? ?Pharmacy Consult for heparin dosing ?Indication: atrial fibrillation ? ?Allergies  ?Allergen Reactions  ? Shellfish Allergy Anaphylaxis and Other (See Comments)  ?  Tongue swelling, hives ?  ? Sulfa Antibiotics Anaphylaxis and Rash  ?  Tongue swelling, hives  ? Ace Inhibitors Other (See Comments) and Cough  ?  CKD, renal failure   ? Escitalopram Other (See Comments)  ?  Buzzing in ears,headache, felt like a zombie ?  ? Evolocumab Other (See Comments)  ?  Myalgias, flu like sx ?  ? Fenofibrate Other (See Comments)  ?  Body aches - pt currently taking isnt sure if its causing any pain ?  ? Invokana [Canagliflozin] Other (See Comments)  ?  Syncope / dehydration  ? Lisinopril Cough  ? Metformin And Related Itching  ? Pravastatin Sodium Other (See Comments)  ?  myalgias  ? Crestor [Rosuvastatin] Other (See Comments)  ?  Myalgias ?  ? Horse-Derived Products Rash  ? Lac Bovis Nausea Only  ?  Ties stomach in knots  ?  ? Lexapro [Escitalopram Oxalate] Other (See Comments)  ?  Buzzing in ears,headache, felt like a zombie  ? Lipitor [Atorvastatin] Other (See Comments)  ?  myalgias  ? Livalo [Pitavastatin] Other (See Comments)  ?  Myalgias ?  ? Tape Rash  ? ? ?Patient Measurements: ?Height: '6\' 3"'$  (190.5 cm) ?Weight: 131.5 kg (290 lb) ?IBW/kg (Calculated) : 84.5 ?Heparin Dosing Weight: 113.4kg ? ?Vital Signs: ?Temp: 98.5 ?F (36.9 ?C) (03/12 0310) ?Temp Source: Oral (03/12 0310) ?BP: 123/85 (03/12 0310) ?Pulse Rate: 93 (03/12 0310) ? ?Labs: ?Recent Labs  ?  07/25/21 ?1359 07/25/21 ?1607 07/26/21 ?0114 07/26/21 ?1628 07/27/21 ?0454  ?HGB 14.8  --  14.2  --  13.9  ?HCT 50.2  --  46.4  --  45.0  ?PLT 271  --  273  --  242  ?APTT  --   --   --  53* 65*  ?HEPARINUNFRC  --   --   --  >1.10* >1.10*  ?CREATININE 1.57*  --  1.91*  --  1.94*  ?TROPONINIHS 52* 58*  --   --   --   ? ? ? ?Estimated Creatinine Clearance: 51.8 mL/min (A) (by C-G formula based on SCr of 1.94 mg/dL  (H)). ? ? ?Medical History: ?Past Medical History:  ?Diagnosis Date  ? Anxiety   ? Arthritis   ? Atrial fibrillation ([place])   ? CAD (coronary artery disease)   ? a. 2010: DES to CTO of RCA. EF 55% b. 07/2016: cath showing total occlusion within previously placed RCA stent (collaterals present), severe stenosis along LCx and OM1 (treated with 2 overlapping DES). c. repeat cath in 01/2018 showing patent stents along LCx and OM with CTO of D2, CTO of distal LCx, and CTO of RCA with collaterals present overall unchanged since 2018 with medical management recom  ? Cellulitis and abscess rt groin   ? Complication of anesthesia   ? " I woke up during a colonoscopy "     ? Depression   ? Diabetes mellitus   ? Diastolic CHF ([place])   ? Disorders of iron metabolism   ? Dysrhythmia   ? GERD (gastroesophageal reflux disease)   ? Hyperlipidemia   ? Hypertension   ? Low serum testosterone level   ? Medically noncompliant   ? Myocardial infarction [facility])   ? 05-23-20  ? ? ?Medications:  ?See MAR ? ?Assessment: ?Patient presented with  hypotension, fatigue, and lower extremity pain from PCP on 07/25/21. He has multiple LE wounds that are oozing and weeping. He is on PTA Eliquis for afib that was continued inpatient. He also has PAD with recent femoropopliteal bypass.  ? ?Last dose of Eliquis 3/11 @ 0823. Switched to heparin per vascular surgery in case patient will need operation. Dose adjustments per aPTT until it correlates with anti-Xa level.  ? ?aPTT slightly subtherapeutic this morning at 65 on 1550 units/hr of heparin. No infusion issues or signs of bleeding noted. Will increase rate slightly to 1650 units/hr and check aPTT in 6 hours.  ? ?Goal of Therapy:  ?Heparin level 0.3-0.7 units/ml ?aPTT 66-102 seconds ?Monitor platelets by anticoagulation protocol: Yes ?  ?Plan:  ?Increase heparin infusion to 1650 units/hr ?Check anti-Xa level in 6 hours and daily while on heparin ?Check aPTT level with anti-Xa until the values  correlate ?Continue to monitor H&H and platelets ? ?Donald Pore ?07/27/2021,7:35 AM ? ? ?

## 2021-07-27 NOTE — Progress Notes (Signed)
PROGRESS NOTE    [name] [name]  XTG:[id number] DOB: 06-24-1950 DOA: 07/25/2021 PCP: [name], FNP    Chief Complaint  Patient presents with   Hypotension    Brief Narrative:   [name] [name] is a 71 y.o. male with medical history significant of paroxysmal atrial fibrillation, h/o NSTEMI with coronary artery disease with multiple stents, diabetes, diastolic heart failure, peripheral artery occlusive disease with recent femoropopliteal bypass with multiple skin ulcerations and necrotic areas on feet bilaterally.  Patient sent to the emergency department from family medicine office due to concerns for sepsis.  Patient has had severe fatigue and fever and rigors over the past 3 days with worsening lower extremity pain left greater than right.  He has multiple ulcerations that have been weeping and oozing.  Patient was mildly hypotensive on arrival with tachycardia.  This improved with IV fluids.  Patient was started on vancomycin.  X-rays obtained showed no x-Violet evidence of osteomyelitis.   Assessment & Plan:   Principal Problem:   Leg wound, left Active Problems:   Hypertension associated with diabetes (HCC)   GERD (gastroesophageal reflux disease)   Diabetic peripheral neuropathy associated with type 2 diabetes mellitus (HCC)   Diabetic neuropathy (HCC)   Diabetes (HCC)   Morbid obesity (HCC)   CHF (congestive heart failure) (HCC)   PAF (paroxysmal atrial fibrillation) (HCC)   S/P femoral-popliteal bypass surgery   PAOD (peripheral arterial occlusive disease) ([place])  Bilateral lower extremity wound with bacteremia  -With wounds appear to be infected, with blood culture growing Streptococcus agalactia -Narrowed antibiotic to cultures, transitioned from vancomycin, cefepime and Flagyl, to  penicillin G -No evidence of osteomyelitis on left foot MRI   Peripheral artery occlusive disease status post femoropopliteal bypass -Continue with Plavix, Eliquis changed to heparin GTT  per vascular surgery for possible need for angiogram early next week. -Management per vascular surgery  Paroxysmal atrial fibrillation on anticoagulation -Currently normal sinus rhythm -Continue with Toprol-XL -On Eliquis, currently on heparin GTT  Diabetes with nephropathy - Continue home insulin with sliding scale - Continue gabapentin -Farxiga given worsening renal function  AKI in CKD stage IIIa -Baseline creatinine 1.5, up to 1.9, hold nephrotoxic medications, hold Lasix, will increase IV fluid to 100 cc/h, will check bladder scan as well  -Will discontinue Farxiga for now.    Morbid obesity  Hypertension -Continue with Imdur and metoprolol  Hyponatremia -We will start on IV normal saline  Hyperkalemia -Recheck in a.m.  Hypertension -Continue with Imdur and metoprolol     DVT prophylaxis: Eliquis Code Status: Full Family Communication: wife at bedside Disposition:   Status is: Inpatient Remains inpatient appropriate because: IV ABX   Consultants:  Vascular surgery  Subjective:  Patient reports some headache earlier today, has improved now, otherwise no complaints.     Objective: Vitals:   07/26/21 1930 07/26/21 2338 07/27/21 0310 07/27/21 0810  BP: 123/75 (!) 108/44 123/85 (!) 141/82  Pulse: 89 80 93 84  Resp: '17 17 18 13  '$ Temp: 98.6 F (37 C) 98.5 F (36.9 C) 98.5 F (36.9 C) (!) 97.3 F (36.3 C)  TempSrc: Oral Oral Oral Oral  SpO2: 95% 94% 91% 95%  Weight:      Height:        Intake/Output Summary (Last 24 hours) at 07/27/2021 1104 Last data filed at 07/27/2021 0600 Gross per 24 hour  Intake 3017.54 ml  Output 2100 ml  Net 917.54 ml   Filed Weights   07/25/21 1313  07/25/21 1947  Weight: 131.5 kg 131.5 kg    Examination:   Awake Alert, Oriented X 3, No new F.N deficits, Normal affect Symmetrical Chest wall movement, Good air movement bilaterally, CTAB RRR,No Gallops,Rubs or new Murmurs, No Parasternal Heave +ve B.Sounds, Abd  Soft, No tenderness, No rebound - guarding or rigidity. Ischemic changes to the right first and second toe, and the left first, second and third toes, left heel ulcer.     Data Reviewed: I have personally reviewed following labs and imaging studies  CBC: Recent Labs  Lab 07/25/21 1359 07/26/21 0114 07/27/21 0627  WBC 8.5 12.8* 8.8  NEUTROABS 6.5  --   --   HGB 14.8 14.2 13.9  HCT 50.2 46.4 45.0  MCV 84.1 82.9 82.3  PLT 271 273 433    Basic Metabolic Panel: Recent Labs  Lab 07/25/21 1359 07/26/21 0114 07/27/21 0627  NA 134* 131* 130*  K 4.2 5.2* 4.0  CL 95* 94* 95*  CO2 '27 24 24  '$ GLUCOSE 136* 171* 166*  BUN 22 21 28*  CREATININE 1.57* 1.91* 1.94*  CALCIUM 10.1 9.5 9.0    GFR: Estimated Creatinine Clearance: 51.8 mL/min (A) (by C-G formula based on SCr of 1.94 mg/dL (H)).  Liver Function Tests: Recent Labs  Lab 07/25/21 1359  AST 29  ALT 22  ALKPHOS 41  BILITOT 0.9  PROT 8.6*  ALBUMIN 4.4    CBG: Recent Labs  Lab 07/26/21 0756 07/26/21 1154 07/26/21 1616 07/26/21 2046 07/27/21 0809  GLUCAP 179* 170* 194* 182* 171*     Recent Results (from the past 240 hour(s))  Microscopic Examination     Status: None   Collection Time: 07/25/21 11:30 AM   Urine  Result Value Ref Range Status   WBC, UA None seen 0 - 5 /hpf Final   RBC 0-2 0 - 2 /hpf Final   Epithelial Cells (non renal) 0-10 0 - 10 /hpf Final   Renal Epithel, UA None seen None seen /hpf Final   Bacteria, UA None seen None seen/Few Final  Blood culture (routine x 2)     Status: None (Preliminary result)   Collection Time: 07/25/21  2:00 PM   Specimen: BLOOD RIGHT ARM  Result Value Ref Range Status   Specimen Description   Final    BLOOD RIGHT ARM BOTTLES DRAWN AEROBIC AND ANAEROBIC   Special Requests Blood Culture adequate volume  Final   Culture   Final    NO GROWTH 2 DAYS Performed at [facility], [address], [place], [place] [address]    Report Status PENDING  Incomplete  Blood  culture (routine x 2)     Status: Abnormal (Preliminary result)   Collection Time: 07/25/21  2:01 PM   Specimen: Left Antecubital; Blood  Result Value Ref Range Status   Specimen Description   Final    LEFT ANTECUBITAL BOTTLES DRAWN AEROBIC AND ANAEROBIC Performed at [facility], [address] [place], [place] [address]    Special Requests   Final    Blood Culture adequate volume Performed at [facility], [address], [place], [place] [address]    Culture  Setup Time   Final    GRAM POSITIVE COCCI IN CHAINS Gram Stain Report Called to,Read Back By and Verified With: DOBY,J @ 0320 ON 07/26/21 BY JUW AEROBIC BOTTLE ONLY GS DONE @ [facility] CRITICAL RESULT CALLED TO, READ BACK BY AND VERIFIED WITH: G,BARR PHARMD '@0810'$  07/26/21 EB    Culture (A)  Final  STREPTOCOCCUS AGALACTIAE SUSCEPTIBILITIES TO FOLLOW Performed at [facility], [place] [address] [place], [place] [address]    Report Status PENDING  Incomplete  Blood Culture ID Panel (Reflexed)     Status: Abnormal   Collection Time: 07/25/21  2:01 PM  Result Value Ref Range Status   Enterococcus faecalis NOT DETECTED NOT DETECTED Final   Enterococcus Faecium NOT DETECTED NOT DETECTED Final   Listeria monocytogenes NOT DETECTED NOT DETECTED Final   Staphylococcus species NOT DETECTED NOT DETECTED Final   Staphylococcus aureus (BCID) NOT DETECTED NOT DETECTED Final   Staphylococcus epidermidis NOT DETECTED NOT DETECTED Final   Staphylococcus lugdunensis NOT DETECTED NOT DETECTED Final   Streptococcus species DETECTED (A) NOT DETECTED Final    Comment: CRITICAL RESULT CALLED TO, READ BACK BY AND VERIFIED WITH: G BARR PHARMD '@0811'$  07/26/21 EB    Streptococcus agalactiae DETECTED (A) NOT DETECTED Final    Comment: CRITICAL RESULT CALLED TO, READ BACK BY AND VERIFIED WITH: G BARR PHARMD '@0811'$  07/26/21 EB    Streptococcus pneumoniae NOT DETECTED NOT DETECTED Final   Streptococcus pyogenes NOT DETECTED NOT DETECTED Final    A.calcoaceticus-baumannii NOT DETECTED NOT DETECTED Final   Bacteroides fragilis NOT DETECTED NOT DETECTED Final   Enterobacterales NOT DETECTED NOT DETECTED Final   Enterobacter cloacae complex NOT DETECTED NOT DETECTED Final   Escherichia coli NOT DETECTED NOT DETECTED Final   Klebsiella aerogenes NOT DETECTED NOT DETECTED Final   Klebsiella oxytoca NOT DETECTED NOT DETECTED Final   Klebsiella pneumoniae NOT DETECTED NOT DETECTED Final   Proteus species NOT DETECTED NOT DETECTED Final   Salmonella species NOT DETECTED NOT DETECTED Final   Serratia marcescens NOT DETECTED NOT DETECTED Final   Haemophilus influenzae NOT DETECTED NOT DETECTED Final   Neisseria meningitidis NOT DETECTED NOT DETECTED Final   Pseudomonas aeruginosa NOT DETECTED NOT DETECTED Final   Stenotrophomonas maltophilia NOT DETECTED NOT DETECTED Final   Candida albicans NOT DETECTED NOT DETECTED Final   Candida auris NOT DETECTED NOT DETECTED Final   Candida glabrata NOT DETECTED NOT DETECTED Final   Candida krusei NOT DETECTED NOT DETECTED Final   Candida parapsilosis NOT DETECTED NOT DETECTED Final   Candida tropicalis NOT DETECTED NOT DETECTED Final   Cryptococcus neoformans/gattii NOT DETECTED NOT DETECTED Final    Comment: Performed at [facility], [address] [place],  [address]  Resp Panel by RT-PCR (Flu A&B, Covid) Nasopharyngeal Swab     Status: None   Collection Time: 07/25/21  3:01 PM   Specimen: Nasopharyngeal Swab; Nasopharyngeal(NP) swabs in vial transport medium  Result Value Ref Range Status   SARS Coronavirus 2 by RT PCR NEGATIVE NEGATIVE Final    Comment: (NOTE) SARS-CoV-2 target nucleic acids are NOT DETECTED.  The SARS-CoV-2 RNA is generally detectable in upper respiratory specimens during the acute phase of infection. The lowest concentration of SARS-CoV-2 viral copies this assay can detect is 138 copies/mL. A negative result does not preclude SARS-Cov-2 infection and  should not be used as the sole basis for treatment or other patient management decisions. A negative result may occur with  improper specimen collection/handling, submission of specimen other than nasopharyngeal swab, presence of viral mutation(s) within the areas targeted by this assay, and inadequate number of viral copies(<138 copies/mL). A negative result must be combined with clinical observations, patient history, and epidemiological information. The expected result is Negative.  Fact Sheet for Patients:  EntrepreneurPulse.com.au  Fact Sheet for Healthcare Providers:  IncredibleEmployment.be  This  test is no t yet approved or cleared by the [place] and  has been authorized for detection and/or diagnosis of SARS-CoV-2 by FDA under an Emergency Use Authorization (EUA). This EUA will remain  in effect (meaning this test can be used) for the duration of the COVID-19 declaration under Section 564(b)(1) of the Act, 21 U.S.C.section 360bbb-3(b)(1), unless the authorization is terminated  or revoked sooner.       Influenza A by PCR NEGATIVE NEGATIVE Final   Influenza B by PCR NEGATIVE NEGATIVE Final    Comment: (NOTE) The Xpert Xpress SARS-CoV-2/FLU/RSV plus assay is intended as an aid in the diagnosis of influenza from Nasopharyngeal swab specimens and should not be used as a sole basis for treatment. Nasal washings and aspirates are unacceptable for Xpert Xpress SARS-CoV-2/FLU/RSV testing.  Fact Sheet for Patients: EntrepreneurPulse.com.au  Fact Sheet for Healthcare Providers: IncredibleEmployment.be  This test is not yet approved or cleared by the [place] FDA and has been authorized for detection and/or diagnosis of SARS-CoV-2 by FDA under an Emergency Use Authorization (EUA). This EUA will remain in effect (meaning this test can be used) for the duration of the COVID-19 declaration  under Section 564(b)(1) of the Act, 21 U.S.C. section 360bbb-3(b)(1), unless the authorization is terminated or revoked.  Performed at [facility], [address] [place], [place] [address]          Radiology Studies: MR FOOT LEFT W WO CONTRAST  Result Date: 07/25/2021 CLINICAL DATA:  Diffuse left foot pain for 2 months. No known injury. EXAM: MRI OF THE LEFT FOREFOOT WITHOUT AND WITH CONTRAST TECHNIQUE: Multiplanar, multisequence MR imaging of the left foot was performed both before and after administration of intravenous contrast. CONTRAST:  64m GADAVIST GADOBUTROL 1 MMOL/ML IV SOLN COMPARISON:  X-[place] 07/25/2021 FINDINGS: Technical Note: Despite efforts by the technologist and patient, motion artifact is present on today's exam and could not be eliminated. This reduces exam sensitivity and specificity. Bones/Joint/Cartilage No acute fracture. No dislocation. Remote healed fractures of the second and third metatarsal diaphyses. Artifact near the edge of the field of view results in poor fat saturation of the toes on T2 weighted images. In correlation with sister sequences. No definite sites of bone marrow edema. No erosion or abnormal marrow enhancement. No evidence of marrow replacement. Degenerative changes of the foot are most pronounced at the first MTP joint. No effusion. Ligaments No evidence of acute ligamentous injury. Muscles and Tendons Denervation changes of the intrinsic foot musculature. Flexor and extensor tendons appear intact. No tenosynovitis. Soft tissues Subcutaneous edema most pronounced along the dorsum of the forefoot. No deep soft tissue ulceration. No organized fluid collection. IMPRESSION: 1. Limited exam. 2. Subcutaneous edema most pronounced along the dorsum of the forefoot, nonspecific but could represent cellulitis in the appropriate clinical setting. No organized fluid collection. 3. No acute osseous abnormality. No evidence of osteomyelitis. 4. Remote healed fractures  of the second and third metatarsal diaphyses. Electronically Signed   By: [name]   On: 07/25/2021 17:49   DG Foot Complete Left  Result Date: 07/25/2021 CLINICAL DATA:  Pain and swelling EXAM: LEFT FOOT - COMPLETE 3+ VIEW COMPARISON:  None. FINDINGS: There is generalized soft tissue swelling of the foot. There is a there is no frank bony destruction. Evidence of old second and third metatarsal injuries. Plantar dorsal calcaneal spurring. Vascular calcifications. IMPRESSION: Soft tissue swelling.  No acute osseous abnormality. Electronically Signed   By: JIleene PatrickD.  On: 07/25/2021 15:27   DG Foot Complete Right  Result Date: 07/25/2021 CLINICAL DATA:  Pain and swelling EXAM: RIGHT FOOT COMPLETE - 3+ VIEW COMPARISON:  None. FINDINGS: There is generalized soft tissue swelling of the foot. There is no frank bony destruction. Plantar dorsal calcaneal spurring. Vascular calcifications. IMPRESSION: Soft tissue swelling.  No acute osseous abnormality. Electronically Signed   By: Maurine Simmering M.D.   On: 07/25/2021 15:28   VAS [place] ABI WITH/WO TBI  Result Date: 07/26/2021  LOWER EXTREMITY DOPPLER STUDY Patient Name:  [name] [name]  Date of Exam:   07/26/2021 Medical Rec #: [id number]     Accession #:    [id number] Date of Birth: [birth year]     Patient Gender: M Patient Age:   50 years Exam Location:  [facility] Procedure:      VAS [place] ABI WITH/WO TBI Referring Phys: Monica Martinez --------------------------------------------------------------------------------  Indications: Ulceration, and peripheral artery disease. High Risk         Hypertension, hyperlipidemia, Diabetes, coronary artery Factors:          disease. Other Factors: Atrial fibrillation.  Vascular Interventions: Left CFA to Above knee popliteal bypass 11/22/20, Right                         SFA stent 11/31/2020 and 12/02/20. Comparison Study: Prior ABI done 12/20/20 Performing Technologist: Sharion Dove RVS  Examination  Guidelines: A complete evaluation includes at minimum, Doppler waveform signals and systolic blood pressure reading at the level of bilateral brachial, anterior tibial, and posterior tibial arteries, when vessel segments are accessible. Bilateral testing is considered an integral part of a complete examination. Photoelectric Plethysmograph (PPG) waveforms and toe systolic pressure readings are included as required and additional duplex testing as needed. Limited examinations for reoccurring indications may be performed as noted.  ABI Findings: +---------+------------------+-----+-----------+--------+  Right     Rt Pressure (mmHg) Index Waveform    Comment   +---------+------------------+-----+-----------+--------+  Brachial  100                      multiphasic           +---------+------------------+-----+-----------+--------+  PTA       87                 0.78  monophasic            +---------+------------------+-----+-----------+--------+  DP        42                 0.38  monophasic            +---------+------------------+-----+-----------+--------+  Great Toe 74                 0.67                        +---------+------------------+-----+-----------+--------+ +---------+------------------+-----+-------------------+-------+  Left      Lt Pressure (mmHg) Index Waveform            Comment  +---------+------------------+-----+-------------------+-------+  Brachial  111                      multiphasic                  +---------+------------------+-----+-------------------+-------+  PTA       59  0.53  monophasic                   +---------+------------------+-----+-------------------+-------+  DP        78                 0.70  dampened monophasic          +---------+------------------+-----+-------------------+-------+  Great Toe 18                 0.16                               +---------+------------------+-----+-------------------+-------+  +-------+-----------+-----------+------------+------------+  ABI/TBI Today's ABI Today's TBI Previous ABI Previous TBI  +-------+-----------+-----------+------------+------------+  Right   0.78        0.67        0.57         0.39          +-------+-----------+-----------+------------+------------+  Left    0.70        0.16        0.66         0.67          +-------+-----------+-----------+------------+------------+  Bilateral ABIs appear essentially unchanged compared to prior study on 12/20/20. Left TBIs appear decreased compared to prior study on 12/20/20. Right TBI appears increased since study done 12/20/20  Summary: Right: Resting right ankle-brachial index indicates moderate right lower extremity arterial disease. The right toe-brachial index is abnormal. Left: Resting left ankle-brachial index indicates moderate left lower extremity arterial disease. The left toe-brachial index is abnormal.  *See table(s) above for measurements and observations.     Preliminary    VAS [place] LOWER EXTREMITY ARTERIAL DUPLEX  Result Date: 07/26/2021 LOWER EXTREMITY ARTERIAL DUPLEX STUDY Patient Name:  [name] [name]  Date of Exam:   07/26/2021 Medical Rec #: [id number]     Accession #:    [id number] Date of Birth: [birth year]     Patient Gender: M Patient Age:   55 years Exam Location:  [facility] Procedure:      VAS [place] LOWER EXTREMITY ARTERIAL DUPLEX Referring Phys: Monica Martinez --------------------------------------------------------------------------------  Indications: Ulceration, and peripheral artery disease. High Risk Factors: Hypertension, hyperlipidemia, Diabetes, coronary artery                    disease. Other Factors: Atrial fibrillation.  Vascular Interventions: Left CFA to Above knee popliteal bypass 11/22/20,                         Right SFA stent 11/31/2020 and 12/02/20. Current ABI:            R:0.78 L:0.70 Limitations: Body habitus Comparison Study: Prior study done 12/20/20 indicating patent Right SFA and  left                   bypass graft Performing Technologist: Sharion Dove RVS  Examination Guidelines: A complete evaluation includes B-mode imaging, spectral Doppler, color Doppler, and power Doppler as needed of all accessible portions of each vessel. Bilateral testing is considered an integral part of a complete examination. Limited examinations for reoccurring indications may be performed as noted.  +-----------+--------+-----+--------+----------+--------+  LEFT        PSV cm/s Ratio Stenosis Waveform   Comments  +-----------+--------+-----+--------+----------+--------+  SFA Distal  17  monophasic           +-----------+--------+-----+--------+----------+--------+  POP Prox    20                      monophasic           +-----------+--------+-----+--------+----------+--------+  POP Distal  32                      monophasic           +-----------+--------+-----+--------+----------+--------+  TP Trunk    35                      monophasic           +-----------+--------+-----+--------+----------+--------+  ATA Distal                 occluded                      +-----------+--------+-----+--------+----------+--------+  PTA Distal                 occluded                      +-----------+--------+-----+--------+----------+--------+  PERO Distal 32                      monophasic           +-----------+--------+-----+--------+----------+--------+  Summary: Left: Monophasic flow noted distal SFA, popliteal, and peroneal arteries. The posterior tibial and anterior tibial arteries appear occluded. Unable to adequately visualize bypass.  See table(s) above for measurements and observations.    Preliminary         Scheduled Meds:  clopidogrel  75 mg Oral Daily   DULoxetine  30 mg Oral Daily   ezetimibe  10 mg Oral Daily   fenofibrate  160 mg Oral Daily   gabapentin  300 mg Oral BID   insulin aspart  0-20 Units Subcutaneous TID WC   insulin aspart  0-5 Units Subcutaneous QHS    insulin aspart protamine- aspart  60 Units Subcutaneous BID WC   isosorbide mononitrate  30 mg Oral Daily   metoprolol succinate  50 mg Oral Daily   pantoprazole  40 mg Oral Daily   Continuous Infusions:  sodium chloride 75 mL/hr at 07/27/21 0500   heparin 1,650 Units/hr (07/27/21 0850)   pencillin G potassium IV 4 Million Units (07/27/21 0934)     LOS: 2 days      Phillips Climes, MD [facility]   To contact the attending provider between 7A-7P or the covering provider during after hours 7P-7A, please log into the web site [email or web address] and access using universal [place] password for that web site. If you do not have the password, please call the hospital operator.  07/27/2021, 11:04 AM

## 2021-07-27 NOTE — Progress Notes (Signed)
ANTICOAGULATION CONSULT NOTE - Follow-Up ? ?Pharmacy Consult for heparin dosing ?Indication: atrial fibrillation ? ?Allergies  ?Allergen Reactions  ? Shellfish Allergy Anaphylaxis and Other (See Comments)  ?  Tongue swelling, hives ?  ? Sulfa Antibiotics Anaphylaxis and Rash  ?  Tongue swelling, hives  ? Ace Inhibitors Other (See Comments) and Cough  ?  CKD, renal failure   ? Escitalopram Other (See Comments)  ?  Buzzing in ears,headache, felt like a zombie ?  ? Evolocumab Other (See Comments)  ?  Myalgias, flu like sx ?  ? Fenofibrate Other (See Comments)  ?  Body aches - pt currently taking isnt sure if its causing any pain ?  ? Invokana [Canagliflozin] Other (See Comments)  ?  Syncope / dehydration  ? Lisinopril Cough  ? Metformin And Related Itching  ? Pravastatin Sodium Other (See Comments)  ?  myalgias  ? Crestor [Rosuvastatin] Other (See Comments)  ?  Myalgias ?  ? Horse-Derived Products Rash  ? Lac Bovis Nausea Only  ?  Ties stomach in knots  ?  ? Lexapro [Escitalopram Oxalate] Other (See Comments)  ?  Buzzing in ears,headache, felt like a zombie  ? Lipitor [Atorvastatin] Other (See Comments)  ?  myalgias  ? Livalo [Pitavastatin] Other (See Comments)  ?  Myalgias ?  ? Tape Rash  ? ? ?Patient Measurements: ?Height: '6\' 3"'$  (190.5 cm) ?Weight: 131.5 kg (290 lb) ?IBW/kg (Calculated) : 84.5 ?Heparin Dosing Weight: 113.4kg ? ?Vital Signs: ?Temp: 98.5 ?F (36.9 ?C) (03/12 1926) ?Temp Source: Oral (03/12 1926) ?BP: 140/72 (03/12 1926) ?Pulse Rate: 88 (03/12 1926) ? ?Labs: ?Recent Labs  ?  07/25/21 ?1359 07/25/21 ?1607 07/26/21 ?0114 07/26/21 ?1628 07/26/21 ?1628 07/27/21 ?0277 07/27/21 ?1457 07/27/21 ?2229  ?HGB 14.8  --  14.2  --   --  13.9  --   --   ?HCT 50.2  --  46.4  --   --  45.0  --   --   ?PLT 271  --  273  --   --  242  --   --   ?APTT  --   --   --  53*   < > 65* 51* 70*  ?HEPARINUNFRC  --   --   --  >1.10*  --  >1.10*  --   --   ?CREATININE 1.57*  --  1.91*  --   --  1.94*  --   --   ?TROPONINIHS 52* 58*   --   --   --   --   --   --   ? < > = values in this interval not displayed.  ? ? ? ?Estimated Creatinine Clearance: 51.8 mL/min (A) (by C-G formula based on SCr of 1.94 mg/dL (H)). ? ? ?Medical History: ?Past Medical History:  ?Diagnosis Date  ? Anxiety   ? Arthritis   ? Atrial fibrillation ([place])   ? CAD (coronary artery disease)   ? a. 2010: DES to CTO of RCA. EF 55% b. 07/2016: cath showing total occlusion within previously placed RCA stent (collaterals present), severe stenosis along LCx and OM1 (treated with 2 overlapping DES). c. repeat cath in 01/2018 showing patent stents along LCx and OM with CTO of D2, CTO of distal LCx, and CTO of RCA with collaterals present overall unchanged since 2018 with medical management recom  ? Cellulitis and abscess rt groin   ? Complication of anesthesia   ? " I woke up during a colonoscopy "     ?  Depression   ? Diabetes mellitus   ? Diastolic CHF ([place])   ? Disorders of iron metabolism   ? Dysrhythmia   ? GERD (gastroesophageal reflux disease)   ? Hyperlipidemia   ? Hypertension   ? Low serum testosterone level   ? Medically noncompliant   ? Myocardial infarction [facility])   ? 05-23-20  ? ? ?Medications:  ?See MAR ? ?Assessment: ?20 yom presenting with hypotension, fatigue, and lower extremity pain from PCP on 07/25/21. He has multiple LE wounds that are oozing and weeping. He also has PAD with recent femoropopliteal bypass. ? ?Patient on Eliquis for afib that was initially continued inpatient.  ?Last dose of Eliquis 3/11 @ 0823. Switched to heparin per vascular surgery in case patient will need operation. Dose adjustments per aPTT until it correlates with anti-Xa level.  ? ?3/12 PM update:  ?aPTT therapeutic after rate increase ? ?Goal of Therapy:  ?Heparin level 0.3-0.7 units/ml ?aPTT 66-102 seconds ?Monitor platelets by anticoagulation protocol: Yes ?  ?Plan:  ?Cont heparin 1950 units/hr ?aPTT and heparin level with AM labs ? ?Narda Bonds, PharmD, BCPS ?Clinical  Pharmacist ?Phone: [phone] ? ? ? ? ?

## 2021-07-27 NOTE — Evaluation (Signed)
Occupational Therapy Evaluation ?Patient Details ?Name: [name] [name] ?MRN: [id number] ?DOB: 1950/12/22 ?Today's Date: 07/27/2021 ? ? ?History of Present Illness [name] [name] is a 71 y.o. male who was sent to the emergency department from family medicine office due to concerns for sepsis, after days of worsening weakness and BLE wounds. Pt  with medical history significant of paroxysmal atrial fibrillation, h/o NSTEMI with coronary artery disease with multiple stents, diabetes, diastolic heart failure, peripheral artery occlusive disease with recent femoropopliteal bypass with multiple skin ulcerations and necrotic areas on feet bilaterally  ? ?Clinical Impression ?  ?[name] [name] was evaluated s/p the above admission list, he is generally mod I at baseline and uses AD as needed. Upon evaluation pt was internally distracted by pain and used sarcasm/joking to deter functional tasks. Overall pt requires min A for functional transfers and up to mod A for ADLs. He is generally limited by pain, generalized weakness and poor activity tolerance with limited insight to safety and deficits. Pt will benefit form OT acutely to address the limiitations listed below. Recommend d/c to home with HHOT to progress function in the home setting.  ?   ? ?Recommendations for follow up therapy are one component of a multi-disciplinary discharge planning process, led by the attending physician.  Recommendations may be updated based on patient status, additional functional criteria and insurance authorization.  ? ?Follow Up Recommendations ? Home health OT (pt declining this date)  ?  ?Assistance Recommended at Discharge Frequent or constant Supervision/Assistance  ?Patient can return home with the following A little help with walking and/or transfers;A little help with bathing/dressing/bathroom;Assist for transportation;Help with stairs or ramp for entrance ? ?  ?Functional Status Assessment ? Patient has had a recent decline in their functional  status and demonstrates the ability to make significant improvements in function in a reasonable and predictable amount of time.  ?Equipment Recommendations ? None recommended by OT  ?  ?   ?Precautions / Restrictions Precautions ?Precautions: Fall ?Restrictions ?Weight Bearing Restrictions: No  ? ?  ? ?Mobility Bed Mobility ?Overal bed mobility: Needs Assistance ?Bed Mobility: Supine to Sit, Sit to Supine ?  ?  ?Supine to sit: Min guard ?Sit to supine: Min guard ?  ?General bed mobility comments: incr time and effort ?  ? ?Transfers ?  ?  ?  ?  ?  ?  ?  ?  ?  ?General transfer comment: pt declined ?  ? ?  ?Balance Overall balance assessment: Needs assistance ?Sitting-balance support: Feet supported ?Sitting balance-Leahy Scale: Fair ?  ?  ?  ?  ?  ?  ?  ?  ?  ?  ?  ?  ?  ?  ?  ?  ?   ? ?ADL either performed or assessed with clinical judgement  ? ?ADL Overall ADL's : Needs assistance/impaired ?Eating/Feeding: Independent;Sitting ?  ?Grooming: Set up;Sitting ?  ?Upper Body Bathing: Set up;Sitting ?  ?Lower Body Bathing: Maximal assistance;Sit to/from stand ?  ?Upper Body Dressing : Set up;Sitting ?  ?Lower Body Dressing: Moderate assistance;Sit to/from stand ?  ?Toilet Transfer: Minimal assistance;Ambulation ?  ?Toileting- Clothing Manipulation and Hygiene: Min guard;Sitting/lateral lean ?  ?  ?  ?  ?General ADL Comments: pt self limiting this session, stating he cannot get OOB 2/2 the need to urinate. also limited by pain and generalized weakness  ? ? ? ?Vision Baseline Vision/History: 0 No visual deficits ?Ability to See in Adequate Light: 0 Adequate ?Vision Assessment?: No  apparent visual deficits  ?   ?Perception   ?  ?Praxis   ?  ? ?Pertinent Vitals/Pain Pain Assessment ?Pain Assessment: Faces ?Faces Pain Scale: Hurts little more ?Pain Location: LLE ?Pain Descriptors / Indicators: Discomfort ?Pain Intervention(s): Limited activity within patient's tolerance  ? ? ? ?Hand Dominance Right ?  ?Extremity/Trunk  Assessment Upper Extremity Assessment ?Upper Extremity Assessment: Overall WFL for tasks assessed ?  ?Lower Extremity Assessment ?Lower Extremity Assessment: Defer to PT evaluation ?  ?Cervical / Trunk Assessment ?Cervical / Trunk Assessment: Other exceptions (incr body habitus) ?  ?Communication Communication ?Communication: No difficulties ?  ?Cognition Arousal/Alertness: Awake/alert ?Behavior During Therapy: [name] for tasks assessed/performed ?Overall Cognitive Status: Within Functional Limits for tasks assessed ?  ?  ?  ?  ?  ?  ?  ?  ?  ?  ?  ?  ?  ?  ?  ?  ?General Comments: WFL but pt with poor insight to safety and deficits. cues for problem solving and sequencing. Pt internally distracted wtih poor attention to task, requires cues. Joking throughout and making sarcastic comments, seemingly to avoid functional takss ?  ?  ?General Comments  VSS on RA, family present ? ?  ?Exercises   ?  ?Shoulder Instructions    ? ? ?Home Living Family/patient expects to be discharged to:: Private residence ?Living Arrangements: Spouse/significant other ?Available Help at Discharge: Family;Available 24 hours/day ?Type of Home: House ?Home Access: Stairs to enter ?  ?  ?Home Layout: One level ?  ?  ?Bathroom Shower/Tub: Tub/shower unit ?  ?Bathroom Toilet: Standard ?  ?  ?Home Equipment: Cane - single Barista (2 wheels);Wheelchair - manual;BSC/3in1;Grab bars - toilet ?  ?  ?  ? ?  ?Prior Functioning/Environment Prior Level of Function : Independent/Modified Independent;Driving ?  ?  ?  ?  ?  ?  ?Mobility Comments: RW, SPC vs. no AD, "when I need it" ?ADLs Comments: reports mod I ?  ? ?  ?  ?OT Problem List: Decreased strength;Decreased range of motion;Decreased activity tolerance;Impaired balance (sitting and/or standing);Pain ?  ?   ?OT Treatment/Interventions: Self-care/ADL training;Therapeutic exercise;DME and/or AE instruction;Therapeutic activities;Balance training;Patient/family education  ?  ?OT  Goals(Current goals can be found in the care plan section) Acute Rehab OT Goals ?Patient Stated Goal: home ?OT Goal Formulation: With patient ?Time For Goal Achievement: 08/10/21 ?Potential to Achieve Goals: Good ?ADL Goals ?Pt Will Perform Grooming: with modified independence;standing ?Pt Will Perform Lower Body Dressing: with modified independence;sit to/from stand ?Pt Will Transfer to Toilet: with modified independence;ambulating ?Additional ADL Goal #1: Pt will demonstrate increased activity tolerance by completing at least 3 ADL tasks in standing independently  ?OT Frequency: Min 2X/week ?  ? ?   ?AM-PAC OT "6 Clicks" Daily Activity     ?Outcome Measure Help from another person eating meals?: None ?Help from another person taking care of personal grooming?: A Little ?Help from another person toileting, which includes using toliet, bedpan, or urinal?: A Little ?Help from another person bathing (including washing, rinsing, drying)?: A Little ?Help from another person to put on and taking off regular upper body clothing?: None ?Help from another person to put on and taking off regular lower body clothing?: A Little ?6 Click Score: 20 ?  ?End of Session Nurse Communication: Mobility status ? ?Activity Tolerance: Patient tolerated treatment well ?Patient left: in bed;with bed alarm set;with call bell/phone within reach;with family/visitor present ? ?OT Visit Diagnosis: Unsteadiness on feet (R26.81);Other abnormalities of  gait and mobility (R26.89);Muscle weakness (generalized) (M62.81);Pain  ?              ?Time: 4259-5638 ?OT Time Calculation (min): 23 min ?Charges:  OT General Charges ?$OT Visit: 1 Visit ?OT Evaluation ?$OT Eval Moderate Complexity: 1 Mod ?OT Treatments ?$Therapeutic Activity: 8-22 mins ? ? ? ?Delitha Elms A Shauni Henner ?07/27/2021, 8:10 PM ?

## 2021-07-28 ENCOUNTER — Inpatient Hospital Stay (HOSPITAL_COMMUNITY): Payer: Medicare Other

## 2021-07-28 ENCOUNTER — Encounter (HOSPITAL_COMMUNITY): Payer: Medicare Other

## 2021-07-28 ENCOUNTER — Ambulatory Visit: Payer: Medicare Other

## 2021-07-28 DIAGNOSIS — B955 Unspecified streptococcus as the cause of diseases classified elsewhere: Secondary | ICD-10-CM

## 2021-07-28 LAB — CBC
HCT: 42 % (ref 39.0–52.0)
Hemoglobin: 13.1 g/dL (ref 13.0–17.0)
MCH: 25.4 pg — ABNORMAL LOW (ref 26.0–34.0)
MCHC: 31.2 g/dL (ref 30.0–36.0)
MCV: 81.6 fL (ref 80.0–100.0)
Platelets: 263 10*3/uL (ref 150–400)
RBC: 5.15 MIL/uL (ref 4.22–5.81)
RDW: 22.5 % — ABNORMAL HIGH (ref 11.5–15.5)
WBC: 7 10*3/uL (ref 4.0–10.5)
nRBC: 0 % (ref 0.0–0.2)

## 2021-07-28 LAB — BASIC METABOLIC PANEL
Anion gap: 10 (ref 5–15)
BUN: 27 mg/dL — ABNORMAL HIGH (ref 8–23)
CO2: 25 mmol/L (ref 22–32)
Calcium: 9 mg/dL (ref 8.9–10.3)
Chloride: 99 mmol/L (ref 98–111)
Creatinine, Ser: 1.94 mg/dL — ABNORMAL HIGH (ref 0.61–1.24)
GFR, Estimated: 37 mL/min — ABNORMAL LOW (ref >60)
Glucose, Bld: 175 mg/dL — ABNORMAL HIGH (ref 70–99)
Potassium: 4 mmol/L (ref 3.5–5.1)
Sodium: 134 mmol/L — ABNORMAL LOW (ref 135–145)

## 2021-07-28 LAB — CULTURE, BLOOD (ROUTINE X 2): Special Requests: ADEQUATE

## 2021-07-28 LAB — HEPARIN LEVEL (UNFRACTIONATED): Heparin Unfractionated: >1.1 IU/mL — ABNORMAL HIGH (ref 0.30–0.70)

## 2021-07-28 LAB — GLUCOSE, CAPILLARY
Glucose-Capillary: 179 mg/dL — ABNORMAL HIGH (ref 70–99)
Glucose-Capillary: 168 mg/dL — ABNORMAL HIGH (ref 70–99)
Glucose-Capillary: 265 mg/dL — ABNORMAL HIGH (ref 70–99)
Glucose-Capillary: 223 mg/dL — ABNORMAL HIGH (ref 70–99)

## 2021-07-28 LAB — HEMOGLOBIN A1C
Hgb A1c MFr Bld: 7.9 % — ABNORMAL HIGH (ref 4.8–5.6)
Mean Plasma Glucose: 180 mg/dL

## 2021-07-28 LAB — APTT: aPTT: 90 seconds — ABNORMAL HIGH (ref 24–36)

## 2021-07-28 MED ORDER — ALPRAZOLAM 0.25 MG PO TABS
0.2500 mg | ORAL_TABLET | Freq: Two times a day (BID) | ORAL | Status: DC | PRN
  Administered 2021-07-28 (×2): 0.25 mg via ORAL
  Filled 2021-07-28 (×2): qty 1

## 2021-07-28 MED ORDER — PENICILLIN G POTASSIUM 20000000 UNITS IJ SOLR
12.0000 10*6.[IU] | Freq: Two times a day (BID) | INTRAVENOUS | Status: DC
  Administered 2021-07-28 – 2021-07-29 (×3): 12 10*6.[IU] via INTRAVENOUS
  Filled 2021-07-28 (×6): qty 12

## 2021-07-28 MED ORDER — NITROGLYCERIN 0.4 MG SL SUBL
0.4000 mg | SUBLINGUAL_TABLET | SUBLINGUAL | Status: DC | PRN
Start: 2021-07-28 — End: 2021-07-29

## 2021-07-28 NOTE — Progress Notes (Signed)
PROGRESS NOTE    [name] STATZER  OHY:[id number] DOB: 1950/06/30 DOA: 07/25/2021 PCP: [name], FNP    Chief Complaint  Patient presents with   Hypotension    Brief Narrative:   [name] [name] is a 71 y.o. male with medical history significant of paroxysmal atrial fibrillation, h/o NSTEMI with coronary artery disease with multiple stents, diabetes, diastolic heart failure, peripheral artery occlusive disease with recent femoropopliteal bypass with multiple skin ulcerations and necrotic areas on feet bilaterally.  Patient sent to the emergency department from family medicine office due to concerns for sepsis.  Patient has had severe fatigue and fever and rigors over the past 3 days with worsening lower extremity pain left greater than right.  He has multiple ulcerations that have been weeping and oozing.  Patient was mildly hypotensive on arrival with tachycardia.  This improved with IV fluids.  Patient was started on vancomycin.  X-rays obtained showed no x-Lebert evidence of osteomyelitis.   Assessment & Plan:   Principal Problem:   Leg wound, left Active Problems:   Hypertension associated with diabetes (HCC)   GERD (gastroesophageal reflux disease)   Diabetic peripheral neuropathy associated with type 2 diabetes mellitus (HCC)   Diabetic neuropathy (HCC)   Diabetes (HCC)   Morbid obesity (HCC)   CHF (congestive heart failure) (HCC)   PAF (paroxysmal atrial fibrillation) (HCC)   S/P femoral-popliteal bypass surgery   PAOD (peripheral arterial occlusive disease) ([place])  Bilateral lower extremity wound with bacteremia  -With wounds appear to be infected, with blood culture growing Streptococcus agalactia -Narrowed antibiotic to cultures, transitioned from vancomycin, cefepime and Flagyl, to  penicillin G -No evidence of osteomyelitis on left foot MRI   Peripheral artery occlusive disease status post femoropopliteal bypass -Continue with Plavix, Eliquis changed to heparin GTT  per vascular surgery for possible need for angiogram early next week. -Management per vascular surgery, likely angiogram tomorrow per vascular surgery, continue with heparin and hold Eliquis for now -Continue with IV hydration  Paroxysmal atrial fibrillation on anticoagulation -Currently normal sinus rhythm -Continue with Toprol-XL -On Eliquis, currently on heparin GTT  Diabetes with nephropathy - Continue home insulin with sliding scale - Continue gabapentin -Farxiga given worsening renal function  AKI in CKD stage IIIa -Baseline creatinine 1.5, up to 1.9, hold nephrotoxic medications, hold Lasix, which at the current rate.   -Will discontinue Farxiga for now.  This can be resumed on discharge  Morbid obesity  Hypertension -Continue with Imdur and metoprolol  Hyponatremia -We will start on IV normal saline  Hyperkalemia -Recheck in a.m.  Hypertension -Continue with Imdur and metoprolol  Patient does report episode of dyspnea today, and an episode of confusion overnight, he does report he is having a panic attack as well, so I will start him on Xanax as needed, and I will obtain chest x-[name] [name]   DVT prophylaxis: Eliquis Code Status: Full Family Communication: None at bedside Disposition:   Status is: Inpatient Remains inpatient appropriate because: IV ABX   Consultants:  Vascular surgery  Subjective:  No significant event as discussed with staff, patient reports episode of dyspnea earlier today, this has resolved, reports he thinks it is panic attack as he had them in the past, as well he did report some confusion earlier today, but this has resolved.      Objective: Vitals:   07/27/21 1926 07/28/21 0000 07/28/21 0400 07/28/21 0757  BP: 140/72 137/73 (!) 152/95 (!) 188/82  Pulse: 88 91 88 90  Resp: 18 (!)  $'21 17 17  'P$ Temp: 98.5 F (36.9 C) 98.6 F (37 C) 98.6 F (37 C) 98 F (36.7 C)  TempSrc: Oral Oral Oral Oral  SpO2: 98% 93% 94% 90%  Weight:       Height:        Intake/Output Summary (Last 24 hours) at 07/28/2021 0954 Last data filed at 07/28/2021 0940 Gross per 24 hour  Intake --  Output 3100 ml  Net -3100 ml   Filed Weights   07/25/21 1313 07/25/21 1947  Weight: 131.5 kg 131.5 kg    Examination:   Awake Alert, Oriented X 3, No new F.N deficits, Normal affect Symmetrical Chest wall movement, Good air movement bilaterally, CTAB RRR,No Gallops,Rubs or new Murmurs, No Parasternal Heave +ve B.Sounds, Abd Soft, No tenderness, No rebound - guarding or rigidity.  Ischemic changes to the right first and second toe, and the left first, second and third toes, left heel ulcer.     Data Reviewed: I have personally reviewed following labs and imaging studies  CBC: Recent Labs  Lab 07/25/21 1359 07/26/21 0114 07/27/21 0627 07/28/21 0504  WBC 8.5 12.8* 8.8 7.0  NEUTROABS 6.5  --   --   --   HGB 14.8 14.2 13.9 13.1  HCT 50.2 46.4 45.0 42.0  MCV 84.1 82.9 82.3 81.6  PLT 271 273 242 829    Basic Metabolic Panel: Recent Labs  Lab 07/25/21 1359 07/26/21 0114 07/27/21 0627 07/28/21 0504  NA 134* 131* 130* 134*  K 4.2 5.2* 4.0 4.0  CL 95* 94* 95* 99  CO2 '27 24 24 25  '$ GLUCOSE 136* 171* 166* 175*  BUN 22 21 28* 27*  CREATININE 1.57* 1.91* 1.94* 1.94*  CALCIUM 10.1 9.5 9.0 9.0    GFR: Estimated Creatinine Clearance: 51.8 mL/min (A) (by C-G formula based on SCr of 1.94 mg/dL (H)).  Liver Function Tests: Recent Labs  Lab 07/25/21 1359  AST 29  ALT 22  ALKPHOS 41  BILITOT 0.9  PROT 8.6*  ALBUMIN 4.4    CBG: Recent Labs  Lab 07/27/21 0809 07/27/21 1203 07/27/21 1603 07/27/21 2120 07/28/21 0800  GLUCAP 171* 187* 199* 180* 179*     Recent Results (from the past 240 hour(s))  Microscopic Examination     Status: None   Collection Time: 07/25/21 11:30 AM   Urine  Result Value Ref Range Status   WBC, UA None seen 0 - 5 /hpf Final   RBC 0-2 0 - 2 /hpf Final   Epithelial Cells (non renal) 0-10 0 - 10  /hpf Final   Renal Epithel, UA None seen None seen /hpf Final   Bacteria, UA None seen None seen/Few Final  Blood culture (routine x 2)     Status: None (Preliminary result)   Collection Time: 07/25/21  2:00 PM   Specimen: BLOOD RIGHT ARM  Result Value Ref Range Status   Specimen Description   Final    BLOOD RIGHT ARM BOTTLES DRAWN AEROBIC AND ANAEROBIC   Special Requests Blood Culture adequate volume  Final   Culture   Final    NO GROWTH 2 DAYS Performed at [facility], [address] [place], [place] [address]    Report Status PENDING  Incomplete  Blood culture (routine x 2)     Status: Abnormal   Collection Time: 07/25/21  2:01 PM   Specimen: Left Antecubital; Blood  Result Value Ref Range Status   Specimen Description   Final    LEFT ANTECUBITAL BOTTLES DRAWN  AEROBIC AND ANAEROBIC Performed at [facility], [address], [place], [place] [address]    Special Requests   Final    Blood Culture adequate volume Performed at [facility], [address], [place], [place] [address]    Culture  Setup Time   Final    GRAM POSITIVE COCCI IN CHAINS Gram Stain Report Called to,Read Back By and Verified With: DOBY,J @ 0320 ON 07/26/21 BY JUW AEROBIC BOTTLE ONLY GS DONE @ [facility] CRITICAL RESULT CALLED TO, READ BACK BY AND VERIFIED WITH: G,BARR PHARMD '@0810'$  07/26/21 EB Performed at [facility], [place] [address] [place], [place] [address]    Culture GROUP B STREP(S.AGALACTIAE)ISOLATED (A)  Final   Report Status 07/28/2021 FINAL  Final   Organism ID, Bacteria GROUP B STREP(S.AGALACTIAE)ISOLATED  Final      Susceptibility   Group b strep(s.agalactiae)isolated - MIC*    CLINDAMYCIN <=0.25 SENSITIVE Sensitive     AMPICILLIN <=0.25 SENSITIVE Sensitive     ERYTHROMYCIN <=0.12 SENSITIVE Sensitive     VANCOMYCIN 0.5 SENSITIVE Sensitive     CEFTRIAXONE <=0.12 SENSITIVE Sensitive     LEVOFLOXACIN 1 SENSITIVE Sensitive     * GROUP B STREP(S.AGALACTIAE)ISOLATED  Blood Culture ID Panel  (Reflexed)     Status: Abnormal   Collection Time: 07/25/21  2:01 PM  Result Value Ref Range Status   Enterococcus faecalis NOT DETECTED NOT DETECTED Final   Enterococcus Faecium NOT DETECTED NOT DETECTED Final   Listeria monocytogenes NOT DETECTED NOT DETECTED Final   Staphylococcus species NOT DETECTED NOT DETECTED Final   Staphylococcus aureus (BCID) NOT DETECTED NOT DETECTED Final   Staphylococcus epidermidis NOT DETECTED NOT DETECTED Final   Staphylococcus lugdunensis NOT DETECTED NOT DETECTED Final   Streptococcus species DETECTED (A) NOT DETECTED Final    Comment: CRITICAL RESULT CALLED TO, READ BACK BY AND VERIFIED WITH: G BARR PHARMD '@0811'$  07/26/21 EB    Streptococcus agalactiae DETECTED (A) NOT DETECTED Final    Comment: CRITICAL RESULT CALLED TO, READ BACK BY AND VERIFIED WITH: G BARR PHARMD '@0811'$  07/26/21 EB    Streptococcus pneumoniae NOT DETECTED NOT DETECTED Final   Streptococcus pyogenes NOT DETECTED NOT DETECTED Final   A.calcoaceticus-baumannii NOT DETECTED NOT DETECTED Final   Bacteroides fragilis NOT DETECTED NOT DETECTED Final   Enterobacterales NOT DETECTED NOT DETECTED Final   Enterobacter cloacae complex NOT DETECTED NOT DETECTED Final   Escherichia coli NOT DETECTED NOT DETECTED Final   Klebsiella aerogenes NOT DETECTED NOT DETECTED Final   Klebsiella oxytoca NOT DETECTED NOT DETECTED Final   Klebsiella pneumoniae NOT DETECTED NOT DETECTED Final   Proteus species NOT DETECTED NOT DETECTED Final   Salmonella species NOT DETECTED NOT DETECTED Final   Serratia marcescens NOT DETECTED NOT DETECTED Final   Haemophilus influenzae NOT DETECTED NOT DETECTED Final   Neisseria meningitidis NOT DETECTED NOT DETECTED Final   Pseudomonas aeruginosa NOT DETECTED NOT DETECTED Final   Stenotrophomonas maltophilia NOT DETECTED NOT DETECTED Final   Candida albicans NOT DETECTED NOT DETECTED Final   Candida auris NOT DETECTED NOT DETECTED Final   Candida glabrata NOT DETECTED  NOT DETECTED Final   Candida krusei NOT DETECTED NOT DETECTED Final   Candida parapsilosis NOT DETECTED NOT DETECTED Final   Candida tropicalis NOT DETECTED NOT DETECTED Final   Cryptococcus neoformans/gattii NOT DETECTED NOT DETECTED Final    Comment: Performed at [facility], [address] [place], [place] [address]  Resp Panel by RT-PCR (Flu A&B, Covid) Nasopharyngeal Swab  Status: None   Collection Time: 07/25/21  3:01 PM   Specimen: Nasopharyngeal Swab; Nasopharyngeal(NP) swabs in vial transport medium  Result Value Ref Range Status   SARS Coronavirus 2 by RT PCR NEGATIVE NEGATIVE Final    Comment: (NOTE) SARS-CoV-2 target nucleic acids are NOT DETECTED.  The SARS-CoV-2 RNA is generally detectable in upper respiratory specimens during the acute phase of infection. The lowest concentration of SARS-CoV-2 viral copies this assay can detect is 138 copies/mL. A negative result does not preclude SARS-Cov-2 infection and should not be used as the sole basis for treatment or other patient management decisions. A negative result may occur with  improper specimen collection/handling, submission of specimen other than nasopharyngeal swab, presence of viral mutation(s) within the areas targeted by this assay, and inadequate number of viral copies(<138 copies/mL). A negative result must be combined with clinical observations, patient history, and epidemiological information. The expected result is Negative.  Fact Sheet for Patients:  EntrepreneurPulse.com.au  Fact Sheet for Healthcare Providers:  IncredibleEmployment.be  This test is no t yet approved or cleared by the [place] FDA and  has been authorized for detection and/or diagnosis of SARS-CoV-2 by FDA under an Emergency Use Authorization (EUA). This EUA will remain  in effect (meaning this test can be used) for the duration of the COVID-19 declaration under Section 564(b)(1) of  the Act, 21 U.S.C.section 360bbb-3(b)(1), unless the authorization is terminated  or revoked sooner.       Influenza A by PCR NEGATIVE NEGATIVE Final   Influenza B by PCR NEGATIVE NEGATIVE Final    Comment: (NOTE) The Xpert Xpress SARS-CoV-2/FLU/RSV plus assay is intended as an aid in the diagnosis of influenza from Nasopharyngeal swab specimens and should not be used as a sole basis for treatment. Nasal washings and aspirates are unacceptable for Xpert Xpress SARS-CoV-2/FLU/RSV testing.  Fact Sheet for Patients: EntrepreneurPulse.com.au  Fact Sheet for Healthcare Providers: IncredibleEmployment.be  This test is not yet approved or cleared by the [place] FDA and has been authorized for detection and/or diagnosis of SARS-CoV-2 by FDA under an Emergency Use Authorization (EUA). This EUA will remain in effect (meaning this test can be used) for the duration of the COVID-19 declaration under Section 564(b)(1) of the Act, 21 U.S.C. section 360bbb-3(b)(1), unless the authorization is terminated or revoked.  Performed at [facility], [address] [place], [place] [address]          Radiology Studies: VAS [place] ABI WITH/WO TBI  Result Date: 07/27/2021  LOWER EXTREMITY DOPPLER STUDY Patient Name:  AMMAN BARTEL  Date of Exam:   07/26/2021 Medical Rec #: [id number]     Accession #:    [id number] Date of Birth: 1950/06/24     Patient Gender: M Patient Age:   27 years Exam Location:  [facility] Procedure:      VAS [place] ABI WITH/WO TBI Referring Phys: Monica Martinez --------------------------------------------------------------------------------  Indications: Ulceration, and peripheral artery disease. High Risk         Hypertension, hyperlipidemia, Diabetes, coronary artery Factors:          disease. Other Factors: Atrial fibrillation.  Vascular Interventions: Left CFA to Above knee popliteal bypass 11/22/20, Right                         SFA  stent 11/31/2020 and 12/02/20. Comparison Study: Prior ABI done 12/20/20 Performing Technologist: Sharion Dove RVS  Examination Guidelines: A complete evaluation includes at minimum, Doppler  waveform signals and systolic blood pressure reading at the level of bilateral brachial, anterior tibial, and posterior tibial arteries, when vessel segments are accessible. Bilateral testing is considered an integral part of a complete examination. Photoelectric Plethysmograph (PPG) waveforms and toe systolic pressure readings are included as required and additional duplex testing as needed. Limited examinations for reoccurring indications may be performed as noted.  ABI Findings: +---------+------------------+-----+-----------+--------+  Right     Rt Pressure (mmHg) Index Waveform    Comment   +---------+------------------+-----+-----------+--------+  Brachial  100                      multiphasic           +---------+------------------+-----+-----------+--------+  PTA       87                 0.78  monophasic            +---------+------------------+-----+-----------+--------+  DP        42                 0.38  monophasic            +---------+------------------+-----+-----------+--------+  Great Toe 74                 0.67                        +---------+------------------+-----+-----------+--------+ +---------+------------------+-----+-------------------+-------+  Left      Lt Pressure (mmHg) Index Waveform            Comment  +---------+------------------+-----+-------------------+-------+  Brachial  111                      multiphasic                  +---------+------------------+-----+-------------------+-------+  PTA       59                 0.53  monophasic                   +---------+------------------+-----+-------------------+-------+  DP        78                 0.70  dampened monophasic          +---------+------------------+-----+-------------------+-------+  Great Toe 18                 0.16                                +---------+------------------+-----+-------------------+-------+ +-------+-----------+-----------+------------+------------+  ABI/TBI Today's ABI Today's TBI Previous ABI Previous TBI  +-------+-----------+-----------+------------+------------+  Right   0.78        0.67        0.57         0.39          +-------+-----------+-----------+------------+------------+  Left    0.70        0.16        0.66         0.67          +-------+-----------+-----------+------------+------------+ Bilateral ABIs appear essentially unchanged compared to prior study on 12/20/20. Left TBIs appear decreased compared to prior study on 12/20/20. Right TBI appears increased since study done 12/20/20  Summary: Right: Resting right ankle-brachial index indicates moderate right lower extremity arterial disease. The right toe-brachial  index is abnormal. Left: Resting left ankle-brachial index indicates moderate left lower extremity arterial disease. The left toe-brachial index is abnormal.  *See table(s) above for measurements and observations.  Electronically signed by Servando Snare MD on 07/27/2021 at 11:59:51 AM.    Final    VAS [place] LOWER EXTREMITY ARTERIAL DUPLEX  Result Date: 07/27/2021 LOWER EXTREMITY ARTERIAL DUPLEX STUDY Patient Name:  [name]  Date of Exam:   07/26/2021 Medical Rec #: [id number]     Accession #:    [id number] Date of Birth: 1950/06/05     Patient Gender: M Patient Age:   34 years Exam Location:  [facility] Procedure:      VAS [place] LOWER EXTREMITY ARTERIAL DUPLEX Referring Phys: Monica Martinez --------------------------------------------------------------------------------  Indications: Ulceration, and peripheral artery disease. High Risk Factors: Hypertension, hyperlipidemia, Diabetes, coronary artery                    disease. Other Factors: Atrial fibrillation.  Vascular Interventions: Left CFA to Above knee popliteal bypass 11/22/20,                         Right SFA stent 11/31/2020 and  12/02/20. Current ABI:            R:0.78 L:0.70 Limitations: Body habitus Comparison Study: Prior study done 12/20/20 indicating patent Right SFA and left                   bypass graft Performing Technologist: Sharion Dove RVS  Examination Guidelines: A complete evaluation includes B-mode imaging, spectral Doppler, color Doppler, and power Doppler as needed of all accessible portions of each vessel. Bilateral testing is considered an integral part of a complete examination. Limited examinations for reoccurring indications may be performed as noted.  +-----------+--------+-----+--------+----------+--------+  LEFT        PSV cm/s Ratio Stenosis Waveform   Comments  +-----------+--------+-----+--------+----------+--------+  SFA Distal  17                      monophasic           +-----------+--------+-----+--------+----------+--------+  POP Prox    20                      monophasic           +-----------+--------+-----+--------+----------+--------+  POP Distal  32                      monophasic           +-----------+--------+-----+--------+----------+--------+  TP Trunk    35                      monophasic           +-----------+--------+-----+--------+----------+--------+  ATA Distal                 occluded                      +-----------+--------+-----+--------+----------+--------+  PTA Distal                 occluded                      +-----------+--------+-----+--------+----------+--------+  PERO Distal 32                      monophasic           +-----------+--------+-----+--------+----------+--------+  Summary: Left: Monophasic flow noted distal SFA, popliteal, and peroneal arteries. The posterior tibial and anterior tibial arteries appear occluded. Unable to adequately visualize bypass.  See table(s) above for measurements and observations. Electronically signed by Servando Snare MD on 07/27/2021 at 12:00:15 PM.    Final         Scheduled Meds:  clopidogrel  75 mg Oral Daily   DULoxetine  30 mg  Oral Daily   ezetimibe  10 mg Oral Daily   fenofibrate  160 mg Oral Daily   gabapentin  300 mg Oral BID   insulin aspart  0-20 Units Subcutaneous TID WC   insulin aspart  0-5 Units Subcutaneous QHS   insulin aspart protamine- aspart  60 Units Subcutaneous BID WC   isosorbide mononitrate  30 mg Oral Daily   metoprolol succinate  50 mg Oral Daily   multivitamin with minerals  1 tablet Oral Daily   nutrition supplement (JUVEN)  1 packet Oral BID BM   pantoprazole  40 mg Oral Daily   Ensure Max Protein  11 oz Oral BID   Continuous Infusions:  sodium chloride 100 mL/hr at 07/28/21 0246   heparin 1,950 Units/hr (07/28/21 0304)   pencillin G potassium IV 4 Million Units (07/28/21 0831)     LOS: 3 days      Phillips Climes, MD [facility]   To contact the attending provider between 7A-7P or the covering provider during after hours 7P-7A, please log into the web site [email or web address] and access using universal [place] password for that web site. If you do not have the password, please call the hospital operator.  07/28/2021, 9:54 AM

## 2021-07-28 NOTE — Progress Notes (Addendum)
?  Progress Note ? ? ? ?07/28/2021 ?8:01 AM ?* No surgery found * ? ?Subjective:  Denies pain in feet; soreness L medial thigh ? ? ?Vitals:  ? 07/28/21 0000 07/28/21 0400  ?BP: 137/73 (!) 152/95  ?Pulse: 91 88  ?Resp: (!) 21 17  ?Temp: 98.6 ?F (37 ?C) 98.6 ?F (37 ?C)  ?SpO2: 93% 94%  ? ?Physical Exam: ?Lungs:  non labored ?Incisions:  L thigh incisions without palpable fluid collections, no erythema ?Extremities:  wounds noted both feet ?Neurologic: A&O ? ?CBC ?   ?Component Value Date/Time  ? WBC 7.0 07/28/2021 0504  ? RBC 5.15 07/28/2021 0504  ? HGB 13.1 07/28/2021 0504  ? HGB 11.7 (L) 05/27/2021 8413  ? HCT 42.0 07/28/2021 0504  ? HCT 40.8 05/27/2021 0938  ? PLT 263 07/28/2021 0504  ? PLT 335 05/27/2021 0938  ? MCV 81.6 07/28/2021 0504  ? MCV 74 (L) 05/27/2021 2440  ? MCH 25.4 (L) 07/28/2021 0504  ? MCHC 31.2 07/28/2021 0504  ? RDW 22.5 (H) 07/28/2021 0504  ? RDW 15.4 05/27/2021 0938  ? LYMPHSABS 1.1 07/25/2021 1359  ? LYMPHSABS 1.6 05/27/2021 0938  ? MONOABS 0.8 07/25/2021 1359  ? EOSABS 0.1 07/25/2021 1359  ? EOSABS 0.1 05/27/2021 0938  ? BASOSABS 0.1 07/25/2021 1359  ? BASOSABS 0.1 05/27/2021 0938  ? ? ?BMET ?   ?Component Value Date/Time  ? NA 134 (L) 07/28/2021 0504  ? NA 139 05/27/2021 0938  ? K 4.0 07/28/2021 0504  ? CL 99 07/28/2021 0504  ? CO2 25 07/28/2021 0504  ? GLUCOSE 175 (H) 07/28/2021 0504  ? BUN 27 (H) 07/28/2021 0504  ? BUN 23 05/27/2021 0938  ? CREATININE 1.94 (H) 07/28/2021 0504  ? CALCIUM 9.0 07/28/2021 0504  ? GFRNONAA 37 (L) 07/28/2021 0504  ? GFRAA 54 (L) 06/20/2020 1529  ? ? ?INR ?   ?Component Value Date/Time  ? INR 1.1 11/20/2020 0936  ? ? ? ?Intake/Output Summary (Last 24 hours) at 07/28/2021 0801 ?Last data filed at 07/28/2021 0246 ?Gross per 24 hour  ?Intake --  ?Output 2300 ml  ?Net -2300 ml  ? ? ? ?Assessment/Plan:  71 y.o. male with BLE wounds ? ?Transferred for sepsis.  BLE wounds despite prior interventions including R SFA stents and L femoral to AK popliteal bypass; CKD with Cr  1.94; we will hydrate him today.  Dr. [name] considering repeat angiography tomorrow.  Continue to hold Eliquis.  OOB with therapy teams today.  Patient will be npo past midnight.  Consent ordered. ? ? ?Dagoberto Ligas, PA-C ?[facility] ?[phone] ?07/28/2021 ?8:01 AM ? ? ?I agree with the above.  I have seen and evaluated the patient.  We discussed proceeding with angiography tomorrow to evaluate his blood flow. ? ?Annamarie Major ?

## 2021-07-28 NOTE — Progress Notes (Signed)
PT Cancellation Note ? ?Patient Details ?Name: [name] [name] ?MRN: [id number] ?DOB: 20-Jul-1950 ? ? ?Cancelled Treatment:    Reason Eval/Treat Not Completed: Pain limiting ability to participate ? ?RN made aware and to give pain meds. Will reattempt after meds  ? ? ?[name], PT ?Acute Rehabilitation Services  ?Pager [phone] ?Office [phone] ? ? ? ?Jeanie Cooks [place] ?07/28/2021, 10:02 AM ?

## 2021-07-28 NOTE — TOC Initial Note (Signed)
Transition of Care (TOC) - Initial/Assessment Note  ? ? ?Patient Details  ?Name: RMANI KELLOGG ?MRN: [id number] ?Date of Birth: May 17, 1951 ? ?Transition of Care (TOC) CM/SW Contact:    ?Cyndi Bender, RN ?Phone Number: ?07/28/2021, 3:16 PM ? ?Clinical Narrative:           ?Spoke to patient regarding rehab recommendations. Patient declined home health but agreeable to outpt rehab. Referral sent. ?TOC will continue to follow for needs ?Expected Discharge Plan: OP Rehab ?Barriers to Discharge: Continued Medical Work up ? ? ?Patient Goals and CMS Choice ?Patient states their goals for this hospitalization and ongoing recovery are:: go home ?CMS Medicare.gov Compare Post Acute Care list provided to:: Patient ?  ? ?Expected Discharge Plan and Services ?Expected Discharge Plan: OP Rehab ?  ?Discharge Planning Services: CM Consult ?  ?Living arrangements for the past 2 months: [place] ?                ?  ?  ?  ?  ?  ?  ?  ?  ?  ?  ? ?Prior Living Arrangements/Services ?Living arrangements for the past 2 months: [place] ?Lives with:: Spouse ?Patient language and need for interpreter reviewed:: Yes ?Do you feel safe going back to the place where you live?: Yes      ?Need for Family Participation in Patient Care: Yes (Comment) ?Care giver support system in place?: Yes (comment) ?Current home services: DME (walker, cane, shower chair) ?Criminal Activity/Legal Involvement Pertinent to Current Situation/Hospitalization: No - Comment as needed ? ?Activities of Daily Living ?Home Assistive Devices/Equipment: Cane (specify quad or straight) ?ADL Screening (condition at time of admission) ?Patient's cognitive ability adequate to safely complete daily activities?: Yes ?Is the patient deaf or have difficulty hearing?: No ?Does the patient have difficulty seeing, even when wearing glasses/contacts?: No ?Does the patient have difficulty concentrating, remembering, or making decisions?: No ?Patient able to express  need for assistance with ADLs?: Yes ?Does the patient have difficulty dressing or bathing?: No ?Independently performs ADLs?: Yes (appropriate for developmental age) ?Does the patient have difficulty walking or climbing stairs?: Yes ?Weakness of Legs: Both ?Weakness of Arms/Hands: None ? ?Permission Sought/Granted ?Permission sought to share information with : Case Manager ?Permission granted to share information with : Yes, Verbal Permission Granted ?   ? Permission granted to share info w AGENCY: outpatient rehab ?   ?   ? ?Emotional Assessment ?Appearance:: Appears stated age ?Attitude/Demeanor/Rapport: Engaged ?Affect (typically observed): Accepting ?Orientation: : Oriented to Self, Oriented to Place, Oriented to  Time, Fluctuating Orientation (Suspected and/or reported Sundowners) ?Alcohol / Substance Use: Not Applicable ?Psych Involvement: No (comment) ? ?Admission diagnosis:  Leg wound, left [S81.802A] ?Patient Active Problem List  ? Diagnosis Date Noted  ? Leg wound, left 07/25/2021  ? S/P femoral-popliteal bypass surgery 11/22/2020  ? PAOD (peripheral arterial occlusive disease) ([place]) 11/22/2020  ? Non-ST elevation (NSTEMI) myocardial infarction ([place]) 05/23/2020  ? Orthostatic hypotension 01/01/2020  ? Statin myopathy 12/06/2019  ? Diarrhea 05/10/2018  ? PAF (paroxysmal atrial fibrillation) ([place]) 03/29/2018  ? Lacunar infarction ([place]) 03/29/2018  ? Unstable angina (HCC)   ? Chest pain 01/18/2018  ? CHF (congestive heart failure) ([place]) 10/21/2017  ? Anxiety 03/09/2017  ? Constipation 11/26/2016  ? History of non-ST elevation myocardial infarction (NSTEMI) 07/29/2016  ? Diabetes ([place]) 05/25/2016  ? Occlusion and stenosis of vertebral artery   ? Acute renal failure ([place]) 05/23/2016  ? Diabetic neuropathy ([place]) 01/13/2016  ?  Depression 11/01/2015  ? Erectile dysfunction 01/23/2015  ? Morbid obesity ([place]) 01/23/2015  ? Periodontal disease 01/23/2015  ? Stopped smoking with greater than 30 pack year history  01/23/2015  ? Diabetic peripheral neuropathy associated with type 2 diabetes mellitus ([place]) 11/08/2014  ? Mucosal abnormality of stomach   ? History of colonic polyps   ? GERD (gastroesophageal reflux disease) 09/06/2014  ? Low serum testosterone level 08/01/2014  ? DDD (degenerative disc disease), cervical 12/20/2013  ? Headache 08/07/2013  ? Medically noncompliant 10/25/2011  ? CAD S/P percutaneous coronary angioplasty 10/25/2011  ? MURMUR 04/16/2009  ? Hypertension associated with diabetes ([place]) 08/02/2008  ? Hyperlipidemia associated with type 2 diabetes mellitus ([place]) 07/31/2008  ? DISORDERS OF IRON METABOLISM 07/31/2008  ? ?PCP:  [name], FNP ?Pharmacy:   ?[place], [place] [facility] [place] [address] ?Phone: [phone] Fax: [phone] ? ?[place], [place] ?[address] ?[address] ?[place] [address] ?Phone: [phone] Fax: [phone] ? ?[place], [place] [address] ?Phone: [phone] Fax: [phone] ? ? ? ? ?Social Determinants of Health (SDOH) Interventions ?  ? ?Readmission Risk Interventions ?Readmission Risk Prevention Plan 11/25/2020  ?Transportation Screening Complete  ?PCP or Specialist Appt within 5-7 Days Complete  ?Home Care Screening Complete  ?Medication Review (RN CM) Complete  ?Some recent data might be hidden  ? ? ? ?

## 2021-07-28 NOTE — H&P (View-Only) (Signed)
?  Progress Note ? ? ? ?07/28/2021 ?8:01 AM ?* No surgery found * ? ?Subjective:  Denies pain in feet; soreness L medial thigh ? ? ?Vitals:  ? 07/28/21 0000 07/28/21 0400  ?BP: 137/73 (!) 152/95  ?Pulse: 91 88  ?Resp: (!) 21 17  ?Temp: 98.6 ?F (37 ?C) 98.6 ?F (37 ?C)  ?SpO2: 93% 94%  ? ?Physical Exam: ?Lungs:  non labored ?Incisions:  L thigh incisions without palpable fluid collections, no erythema ?Extremities:  wounds noted both feet ?Neurologic: A&O ? ?CBC ?   ?Component Value Date/Time  ? WBC 7.0 07/28/2021 0504  ? RBC 5.15 07/28/2021 0504  ? HGB 13.1 07/28/2021 0504  ? HGB 11.7 (L) 05/27/2021 3664  ? HCT 42.0 07/28/2021 0504  ? HCT 40.8 05/27/2021 0938  ? PLT 263 07/28/2021 0504  ? PLT 335 05/27/2021 0938  ? MCV 81.6 07/28/2021 0504  ? MCV 74 (L) 05/27/2021 4034  ? MCH 25.4 (L) 07/28/2021 0504  ? MCHC 31.2 07/28/2021 0504  ? RDW 22.5 (H) 07/28/2021 0504  ? RDW 15.4 05/27/2021 0938  ? LYMPHSABS 1.1 07/25/2021 1359  ? LYMPHSABS 1.6 05/27/2021 0938  ? MONOABS 0.8 07/25/2021 1359  ? EOSABS 0.1 07/25/2021 1359  ? EOSABS 0.1 05/27/2021 0938  ? BASOSABS 0.1 07/25/2021 1359  ? BASOSABS 0.1 05/27/2021 0938  ? ? ?BMET ?   ?Component Value Date/Time  ? NA 134 (L) 07/28/2021 0504  ? NA 139 05/27/2021 0938  ? K 4.0 07/28/2021 0504  ? CL 99 07/28/2021 0504  ? CO2 25 07/28/2021 0504  ? GLUCOSE 175 (H) 07/28/2021 0504  ? BUN 27 (H) 07/28/2021 0504  ? BUN 23 05/27/2021 0938  ? CREATININE 1.94 (H) 07/28/2021 0504  ? CALCIUM 9.0 07/28/2021 0504  ? GFRNONAA 37 (L) 07/28/2021 0504  ? GFRAA 54 (L) 06/20/2020 1529  ? ? ?INR ?   ?Component Value Date/Time  ? INR 1.1 11/20/2020 0936  ? ? ? ?Intake/Output Summary (Last 24 hours) at 07/28/2021 0801 ?Last data filed at 07/28/2021 0246 ?Gross per 24 hour  ?Intake --  ?Output 2300 ml  ?Net -2300 ml  ? ? ? ?Assessment/Plan:  71 y.o. male with BLE wounds ? ?Transferred for sepsis.  BLE wounds despite prior interventions including R SFA stents and L femoral to AK popliteal bypass; CKD with Cr  1.94; we will hydrate him today.  Dr. [name] considering repeat angiography tomorrow.  Continue to hold Eliquis.  OOB with therapy teams today.  Patient will be npo past midnight.  Consent ordered. ? ? ?Dagoberto Ligas, PA-C ?[facility] ?[phone] ?07/28/2021 ?8:01 AM ? ? ?I agree with the above.  I have seen and evaluated the patient.  We discussed proceeding with angiography tomorrow to evaluate his blood flow. ? ?Annamarie Major ?

## 2021-07-28 NOTE — Evaluation (Signed)
Physical Therapy Evaluation ?Patient Details ?Name: MIVAAN CORBITT ?MRN: [id number] ?DOB: February 16, 1951 ?Today's Date: 07/28/2021 ? ?History of Present Illness ? Alexys HILDRED MOLLICA is a 71 y.o. male who was sent to the emergency department from family medicine office due to concerns for sepsis, after days of worsening weakness and BLE wounds. Pt  with medical history significant of paroxysmal atrial fibrillation, h/o NSTEMI with coronary artery disease with multiple stents, diabetes, diastolic heart failure, peripheral artery occlusive disease with recent femoropopliteal bypass with multiple skin ulcerations and necrotic areas on feet bilaterally  ?Clinical Impression ?  ?Pt admitted secondary to problem above with deficits below. PTA patient was living with wife and walking inside home without a device (although in week PTA had begun to use RW).  Pt currently requires minguard assist and simulated cane for ambulation. Distance limited this date due to bleeding IV site.  Anticipate patient will benefit from PT to address problems listed below.Will continue to follow acutely to maximize functional mobility independence and safety.   ?   ?   ? ?Recommendations for follow up therapy are one component of a multi-disciplinary discharge planning process, led by the attending physician.  Recommendations may be updated based on patient status, additional functional criteria and insurance authorization. ? ?Follow Up Recommendations Home health PT ? ?  ?Assistance Recommended at Discharge PRN  ?Patient can return home with the following ? A little help with walking and/or transfers;Help with stairs or ramp for entrance ? ?  ?Equipment Recommendations None recommended by PT  ?Recommendations for Other Services ?    ?  ?Functional Status Assessment Patient has had a recent decline in their functional status and demonstrates the ability to make significant improvements in function in a reasonable and predictable amount of time.  ? ?   ?Precautions / Restrictions Precautions ?Precautions: Fall ?Restrictions ?Weight Bearing Restrictions: No  ? ?  ? ?Mobility ? Bed Mobility ?Overal bed mobility: Needs Assistance ?Bed Mobility: Supine to Sit ?  ?  ?Supine to sit: Min guard, HOB elevated ?Sit to supine: Min guard ?  ?General bed mobility comments: incr time and effort ?  ? ?Transfers ?Overall transfer level: Needs assistance ?Equipment used: None ?Transfers: Sit to/from Stand ?Sit to Stand: Min guard ?  ?  ?  ?  ?  ?General transfer comment: pt required 2 attempts to achieve full upright ?  ? ?Ambulation/Gait ?Ambulation/Gait assistance: Min guard ?Gait Distance (Feet): 12 Feet ?Assistive device: IV Pole ?Gait Pattern/deviations: WFL(Within Functional Limits) ?  ?  ?  ?General Gait Details: limited by IV beginning to drip blood; RN informed ? ?Stairs ?  ?  ?  ?  ?  ? ?Wheelchair Mobility ?  ? ?Modified Rankin (Stroke Patients Only) ?  ? ?  ? ?Balance Overall balance assessment: Needs assistance ?Sitting-balance support: Feet supported ?Sitting balance-Leahy Scale: Fair ?  ?  ?Standing balance support: No upper extremity supported ?Standing balance-Leahy Scale: Fair ?  ?  ?  ?  ?  ?  ?  ?  ?  ?  ?  ?  ?   ? ? ? ?Pertinent Vitals/Pain Pain Assessment ?Pain Assessment: Faces ?Faces Pain Scale: Hurts little more ?Pain Location: LLE ?Pain Descriptors / Indicators: Discomfort ?Pain Intervention(s): Limited activity within patient's tolerance, Monitored during session  ? ? ?Home Living Family/patient expects to be discharged to:: Private residence ?Living Arrangements: Spouse/significant other ?Available Help at Discharge: Family;Available 24 hours/day ?Type of Home: House ?Home Access: Stairs to enter ?  Entrance Stairs-Rails: Right;Left ?Entrance Stairs-Number of Steps: 4 ?  ?Home Layout: One level ?Home Equipment: Cane - single Barista (2 wheels);Wheelchair - manual;BSC/3in1;Grab bars - toilet ?   ?  ?Prior Function Prior Level of Function :  Independent/Modified Independent;Driving ?  ?  ?  ?  ?  ?  ?Mobility Comments: no AD inside home, takes cane when outside; in the past week has used walker inside and outside ?ADLs Comments: reports mod I ?  ? ? ?Hand Dominance  ? Dominant Hand: Right ? ?  ?Extremity/Trunk Assessment  ? Upper Extremity Assessment ?Upper Extremity Assessment: Overall WFL for tasks assessed ?  ? ?Lower Extremity Assessment ?Lower Extremity Assessment: Overall WFL for tasks assessed ?  ? ?Cervical / Trunk Assessment ?Cervical / Trunk Assessment: Other exceptions (incr body habitus)  ?Communication  ? Communication: No difficulties  ?Cognition Arousal/Alertness: Awake/alert ?Behavior During Therapy: [facility] for tasks assessed/performed ?Overall Cognitive Status: Within Functional Limits for tasks assessed ?  ?  ?  ?  ?  ?  ?  ?  ?  ?  ?  ?  ?  ?  ?  ?  ?General Comments: WFL but pt with poor insight to safety and deficits. cues for problem solving and sequencing. Pt internally distracted wtih poor attention to task, requires cues. Joking throughout and making sarcastic comments, seemingly to avoid functional takss ?  ?  ? ?  ?General Comments General comments (skin integrity, edema, etc.): Wife present. Pt reported feeling weak due to off his feet x 4 days ? ?  ?Exercises    ? ?Assessment/Plan  ?  ?PT Assessment Patient needs continued PT services  ?PT Problem List Decreased balance;Decreased mobility;Decreased knowledge of use of DME;Obesity;Decreased skin integrity;Pain ? ?   ?  ?PT Treatment Interventions DME instruction;Gait training;Functional mobility training;Therapeutic activities;Therapeutic exercise;Patient/family education   ? ?PT Goals (Current goals can be found in the Care Plan section)  ?Acute Rehab PT Goals ?Patient Stated Goal: get his strength back ?PT Goal Formulation: With patient ?Time For Goal Achievement: 08/11/21 ?Potential to Achieve Goals: Good ? ?  ?Frequency Min 3X/week ?  ? ? ?Co-evaluation   ?  ?  ?  ?  ? ? ?   ?AM-PAC PT "6 Clicks" Mobility  ?Outcome Measure Help needed turning from your back to your side while in a flat bed without using bedrails?: None ?Help needed moving from lying on your back to sitting on the side of a flat bed without using bedrails?: A Little ?Help needed moving to and from a bed to a chair (including a wheelchair)?: A Little ?Help needed standing up from a chair using your arms (e.g., wheelchair or bedside chair)?: A Little ?Help needed to walk in hospital room?: A Little ?Help needed climbing 3-5 steps with a railing? : A Little ?6 Click Score: 19 ? ?  ?End of Session   ?Activity Tolerance: Patient tolerated treatment well ?Patient left: in chair;with call bell/phone within reach ?Nurse Communication: Mobility status;Other (comment) (Rt hand IV bleeding) ?PT Visit Diagnosis: Unsteadiness on feet (R26.81) ?  ? ?Time: 0102-7253 ?PT Time Calculation (min) (ACUTE ONLY): 51 min ? ? ?Charges:   PT Evaluation ?$PT Eval Low Complexity: 1 Low ?PT Treatments ?$Therapeutic Activity: 8-22 mins ?  ?   ? ? ? ?[name], PT ?Acute Rehabilitation Services  ?Pager [phone] ?Office [phone] ? ? ?Jeanie Cooks Taahir Grisby ?07/28/2021, 1:27 PM ? ?

## 2021-07-28 NOTE — Progress Notes (Signed)
?  Transition of Care (TOC) Screening Note ? ? ?Patient Details  ?Name: [name] [name] ?Date of Birth: 1951/04/18 ? ? ?Transition of Care (TOC) CM/SW Contact:    ?Cyndi Bender, RN ?Phone Number: ?07/28/2021, 8:31 AM ? ? ? ?Transition of Care Department [employer]) has reviewed patient. We will continue to monitor patient advancement through interdisciplinary progression rounds. At this time patient declining OT home health. ?

## 2021-07-28 NOTE — Progress Notes (Signed)
HOSPITAL MEDICINE OVERNIGHT EVENT NOTE   ? ?Notified by nursing that patient is complaining of chest discomfort. ? ?Upon discussion with the patient is complaining of left-sided chest pain, sharp to burning in quality and severe in intensity.  Nursing reports that patient is visibly anxious along with his symptoms. ? ?EKG obtained revealing controlled atrial fibrillation without dynamic ST segment change. ? ?Chest discomfort is unlikely to be cardiac in nature.  We offered to give the patient nitroglycerin to see if this would help but patient has declined and is requesting Xanax for his anxiety instead.  Per his request, we will provide Xanax and reassess. ? ?Vernelle Emerald  MD ?[facility]  ? ? ? ? ? ? ? ? ? ? ?

## 2021-07-28 NOTE — Care Management Important Message (Signed)
Important Message ? ?Patient Details  ?Name: [name] [name] ?MRN: [id number] ?Date of Birth: 11/01/1950 ? ? ?Medicare Important Message Given:  Yes ? ? ? ? ?Brenda Samano ?07/28/2021, 4:14 PM ?

## 2021-07-28 NOTE — Progress Notes (Signed)
ANTICOAGULATION CONSULT NOTE - Follow-Up ? ?Pharmacy Consult for heparin dosing ?Indication: atrial fibrillation ? ?Allergies  ?Allergen Reactions  ? Shellfish Allergy Anaphylaxis and Other (See Comments)  ?  Tongue swelling, hives ?  ? Sulfa Antibiotics Anaphylaxis and Rash  ?  Tongue swelling, hives  ? Ace Inhibitors Other (See Comments) and Cough  ?  CKD, renal failure   ? Escitalopram Other (See Comments)  ?  Buzzing in ears,headache, felt like a zombie ?  ? Evolocumab Other (See Comments)  ?  Myalgias, flu like sx ?  ? Fenofibrate Other (See Comments)  ?  Body aches - pt currently taking isnt sure if its causing any pain ?  ? Invokana [Canagliflozin] Other (See Comments)  ?  Syncope / dehydration  ? Lisinopril Cough  ? Metformin And Related Itching  ? Pravastatin Sodium Other (See Comments)  ?  myalgias  ? Crestor [Rosuvastatin] Other (See Comments)  ?  Myalgias ?  ? Horse-Derived Products Rash  ? Lac Bovis Nausea Only  ?  Ties stomach in knots  ?  ? Lexapro [Escitalopram Oxalate] Other (See Comments)  ?  Buzzing in ears,headache, felt like a zombie  ? Lipitor [Atorvastatin] Other (See Comments)  ?  myalgias  ? Livalo [Pitavastatin] Other (See Comments)  ?  Myalgias ?  ? Tape Rash  ? ? ?Patient Measurements: ?Height: '6\' 3"'$  (190.5 cm) ?Weight: 131.5 kg (290 lb) ?IBW/kg (Calculated) : 84.5 ?Heparin Dosing Weight: 113.4kg ? ?Vital Signs: ?Temp: 98 ?F (36.7 ?C) (03/13 0757) ?Temp Source: Oral (03/13 0757) ?BP: 188/82 (03/13 0757) ?Pulse Rate: 90 (03/13 0757) ? ?Labs: ?Recent Labs  ?  07/25/21 ?1359 07/25/21 ?1607 07/26/21 ?0114 07/26/21 ?0114 07/26/21 ?1628 07/27/21 ?1517 07/27/21 ?1457 07/27/21 ?2229 07/28/21 ?6160  ?HGB 14.8  --  14.2  --   --  13.9  --   --  13.1  ?HCT 50.2  --  46.4  --   --  45.0  --   --  42.0  ?PLT 271  --  273  --   --  242  --   --  263  ?APTT  --   --   --    < > 53* 65* 51* 70* 90*  ?HEPARINUNFRC  --   --   --   --  >1.10* >1.10*  --   --  >1.10*  ?CREATININE 1.57*  --  1.91*  --   --   1.94*  --   --  1.94*  ?TROPONINIHS 52* 58*  --   --   --   --   --   --   --   ? < > = values in this interval not displayed.  ? ? ? ?Estimated Creatinine Clearance: 51.8 mL/min (A) (by C-G formula based on SCr of 1.94 mg/dL (H)). ? ?Assessment: ?37 yom presenting with hypotension, fatigue, and lower extremity pain from PCP on 07/25/21. He has multiple LE wounds that are oozing and weeping. He also has PAD with recent femoropopliteal bypass. ? ?Patient on Eliquis for afib that was initially continued inpatient.  ?Last dose of Eliquis 3/11 @ 0823. Switched to heparin per vascular surgery in case patient will need operation. Dose adjustments per aPTT until it correlates with anti-Xa level.  ? ?aPTT therapeutic  ? ?Goal of Therapy:  ?Heparin level 0.3-0.7 units/ml ?aPTT 66-102 seconds ?Monitor platelets by anticoagulation protocol: Yes ?  ?Plan:  ?Cont heparin 1950 units/hr ?aPTT and heparin level with AM labs ?Vascular procedure  planned for 3/14 ? ?Thank you ?Anette Guarneri, PharmD ? ? ? ? ?

## 2021-07-28 NOTE — Plan of Care (Signed)

## 2021-07-29 ENCOUNTER — Other Ambulatory Visit (HOSPITAL_COMMUNITY): Payer: Self-pay

## 2021-07-29 ENCOUNTER — Inpatient Hospital Stay (HOSPITAL_COMMUNITY): Payer: Medicare Other

## 2021-07-29 ENCOUNTER — Encounter (HOSPITAL_COMMUNITY)
Admission: EM | Disposition: A | Discharge status: Home / Self Care | Payer: Self-pay | Source: Home / Self Care | Attending: Internal Medicine

## 2021-07-29 ENCOUNTER — Encounter (HOSPITAL_COMMUNITY): Payer: Self-pay | Admitting: Surgery

## 2021-07-29 DIAGNOSIS — R7881 Bacteremia: Secondary | ICD-10-CM

## 2021-07-29 LAB — ECHOCARDIOGRAM COMPLETE
AR max vel: 2.88 cm2
AV Area VTI: 3.55 cm2
AV Area mean vel: 3 cm2
AV Mean grad: 2 mmHg
AV Peak grad: 3.7 mmHg
Ao pk vel: 0.96 m/s
Area-P 1/2: 4.96 cm2
Height: 75 in
S' Lateral: 3.8 cm
Weight: 4640 oz

## 2021-07-29 LAB — BASIC METABOLIC PANEL
Anion gap: 9 (ref 5–15)
BUN: 26 mg/dL — ABNORMAL HIGH (ref 8–23)
CO2: 25 mmol/L (ref 22–32)
Calcium: 9.5 mg/dL (ref 8.9–10.3)
Chloride: 100 mmol/L (ref 98–111)
Creatinine, Ser: 1.74 mg/dL — ABNORMAL HIGH (ref 0.61–1.24)
GFR, Estimated: 42 mL/min — ABNORMAL LOW (ref >60)
Glucose, Bld: 166 mg/dL — ABNORMAL HIGH (ref 70–99)
Potassium: 4.2 mmol/L (ref 3.5–5.1)
Sodium: 134 mmol/L — ABNORMAL LOW (ref 135–145)

## 2021-07-29 LAB — CBC
HCT: 41.3 % (ref 39.0–52.0)
Hemoglobin: 12.6 g/dL — ABNORMAL LOW (ref 13.0–17.0)
MCH: 25.3 pg — ABNORMAL LOW (ref 26.0–34.0)
MCHC: 30.5 g/dL (ref 30.0–36.0)
MCV: 82.8 fL (ref 80.0–100.0)
Platelets: 279 10*3/uL (ref 150–400)
RBC: 4.99 MIL/uL (ref 4.22–5.81)
RDW: 22.5 % — ABNORMAL HIGH (ref 11.5–15.5)
WBC: 7.8 10*3/uL (ref 4.0–10.5)
nRBC: 0 % (ref 0.0–0.2)

## 2021-07-29 LAB — GLUCOSE, CAPILLARY
Glucose-Capillary: 132 mg/dL — ABNORMAL HIGH (ref 70–99)
Glucose-Capillary: 174 mg/dL — ABNORMAL HIGH (ref 70–99)
Glucose-Capillary: 172 mg/dL — ABNORMAL HIGH (ref 70–99)

## 2021-07-29 LAB — APTT: aPTT: 68 seconds — ABNORMAL HIGH (ref 24–36)

## 2021-07-29 LAB — HEPARIN LEVEL (UNFRACTIONATED): Heparin Unfractionated: 0.84 IU/mL — ABNORMAL HIGH (ref 0.30–0.70)

## 2021-07-29 SURGERY — ABDOMINAL AORTOGRAM W/LOWER EXTREMITY
Anesthesia: LOCAL | Laterality: Bilateral

## 2021-07-29 MED ORDER — HYDRALAZINE HCL 20 MG/ML IJ SOLN
5.0000 mg | INTRAMUSCULAR | Status: DC | PRN
  Administered 2021-07-29: 5 mg via INTRAVENOUS
  Filled 2021-07-29: qty 1

## 2021-07-29 MED ORDER — ASPIRIN 81 MG PO TBEC: 81.0000 mg | DELAYED_RELEASE_TABLET | Freq: Every day | ORAL | 11 refills | Status: DC

## 2021-07-29 MED ORDER — HEPARIN (PORCINE) IN NACL 1000-0.9 UT/500ML-% IV SOLN
INTRAVENOUS | Status: AC
  Filled 2021-07-29: qty 500

## 2021-07-29 MED ORDER — LABETALOL HCL 5 MG/ML IV SOLN: 10.0000 mg | INTRAVENOUS | Status: DC | PRN

## 2021-07-29 MED ORDER — SODIUM CHLORIDE 0.9% FLUSH: 3.0000 mL | INTRAVENOUS | Status: DC | PRN

## 2021-07-29 MED ORDER — SODIUM CHLORIDE 0.9 % IV SOLN: 250.0000 mL | INTRAVENOUS | Status: DC | PRN

## 2021-07-29 MED ORDER — FENTANYL CITRATE (PF) 100 MCG/2ML IJ SOLN
INTRAMUSCULAR | Status: DC | PRN
Start: 2021-07-29 — End: 2021-07-29
  Administered 2021-07-29 (×2): 50 ug via INTRAVENOUS

## 2021-07-29 MED ORDER — ACETAMINOPHEN 325 MG PO TABS: 650.0000 mg | ORAL_TABLET | ORAL | Status: DC | PRN

## 2021-07-29 MED ORDER — PERFLUTREN LIPID MICROSPHERE
1.0000 mL | INTRAVENOUS | Status: AC | PRN
  Administered 2021-07-29: 7 mL via INTRAVENOUS
  Filled 2021-07-29: qty 10

## 2021-07-29 MED ORDER — MIDAZOLAM HCL 2 MG/2ML IJ SOLN
INTRAMUSCULAR | Status: AC
  Filled 2021-07-29: qty 2

## 2021-07-29 MED ORDER — HEPARIN (PORCINE) IN NACL 1000-0.9 UT/500ML-% IV SOLN
INTRAVENOUS | Status: DC | PRN
Start: 2021-07-29 — End: 2021-07-29
  Administered 2021-07-29 (×2): 500 mL

## 2021-07-29 MED ORDER — FENTANYL CITRATE (PF) 100 MCG/2ML IJ SOLN
INTRAMUSCULAR | Status: AC
Start: 2021-07-29 — End: ?
  Filled 2021-07-29: qty 2

## 2021-07-29 MED ORDER — MIDAZOLAM HCL 2 MG/2ML IJ SOLN
INTRAMUSCULAR | Status: DC | PRN
  Administered 2021-07-29 (×2): 2 mg via INTRAVENOUS

## 2021-07-29 MED ORDER — LIDOCAINE HCL (PF) 1 % IJ SOLN
INTRAMUSCULAR | Status: DC | PRN
  Administered 2021-07-29: 18 mL

## 2021-07-29 MED ORDER — SODIUM CHLORIDE 0.9 % WEIGHT BASED INFUSION
1.0000 mL/kg/h | INTRAVENOUS | Status: DC
  Administered 2021-07-29: 1 mL/kg/h via INTRAVENOUS

## 2021-07-29 MED ORDER — ONDANSETRON HCL 4 MG/2ML IJ SOLN: 4.0000 mg | Freq: Four times a day (QID) | INTRAMUSCULAR | Status: DC | PRN

## 2021-07-29 MED ORDER — LIDOCAINE HCL (PF) 1 % IJ SOLN
INTRAMUSCULAR | Status: AC
  Filled 2021-07-29: qty 30

## 2021-07-29 MED ORDER — ASPIRIN EC 81 MG PO TBEC
81.0000 mg | DELAYED_RELEASE_TABLET | Freq: Every day | ORAL | Status: DC
  Administered 2021-07-29: 81 mg via ORAL
  Filled 2021-07-29: qty 1

## 2021-07-29 MED ORDER — SODIUM CHLORIDE 0.9 % IV SOLN
INTRAVENOUS | Status: DC
Start: 2021-07-29 — End: 2021-07-29

## 2021-07-29 MED ORDER — IODIXANOL 320 MG/ML IV SOLN
INTRAVENOUS | Status: DC | PRN
Start: 2021-07-29 — End: 2021-07-29
  Administered 2021-07-29: 20 mL

## 2021-07-29 MED ORDER — AMOXICILLIN 500 MG PO CAPS
500.0000 mg | ORAL_CAPSULE | Freq: Three times a day (TID) | ORAL | 0 refills | Status: DC
  Filled 2021-07-29: qty 30, 10d supply, fill #0

## 2021-07-29 MED ORDER — SODIUM CHLORIDE 0.9% FLUSH: 3.0000 mL | Freq: Two times a day (BID) | INTRAVENOUS | Status: DC

## 2021-07-29 SURGICAL SUPPLY — 13 items
CATH ANGIO 5F BER2 65CM (CATHETERS) ×1 IMPLANT
CATH NAVICROSS ST 65CM (CATHETERS) IMPLANT
CATH OMNI FLUSH 5F 65CM (CATHETERS) ×1 IMPLANT
CATHETER NAVICROSS ST 65CM (CATHETERS) ×2
CLOSURE MYNX CONTROL 5F (Vascular Products) ×1 IMPLANT
KIT ANGIASSIST CO2 SYSTEM (KITS) ×1 IMPLANT
KIT MICROPUNCTURE NIT STIFF (SHEATH) ×1 IMPLANT
KIT PV (KITS) ×3 IMPLANT
MAT PREVALON FULL STRYKER (MISCELLANEOUS) ×1 IMPLANT
SHEATH PINNACLE 5F 10CM (SHEATH) ×1 IMPLANT
TRANSDUCER W/STOPCOCK (MISCELLANEOUS) ×3 IMPLANT
TRAY PV CATH (CUSTOM PROCEDURE TRAY) ×3 IMPLANT
WIRE BENTSON .035X145CM (WIRE) ×1 IMPLANT

## 2021-07-29 NOTE — Progress Notes (Signed)
Pt suddenly c/o chest pain that is stabbing with a pain score of 8/10 on the left side of the chest.. RN did an EKG and notified provider Inda Merlin MD. Per MD to give a one time order of Nitroglycerin sublingual. If there is no relief to try the PRN anxiety medication ordered. RN explained to the pt the orders and plan from the provider. Patient refused to take nitroglycerin sublingual. Pt states " Nitroglycerin makes me feel lousy, and it will not take the pain away. On top it will give me a bad headache". RN notified provider Inda Merlin MD. Per Provider [place], RN can go ahead and give the PRN anxiety medication and if symptoms not relief by the PRN anxiety, Nitroglycerin will be order for the pt. Pt current SBP 144/81, O2 95% on RA, HR 84 on afib, RR 18 . ?

## 2021-07-29 NOTE — H&P (View-Only) (Signed)
? ? ?Subjective  -  ? ?Patient now back in his room following angiogram today without complaints. ? ? ?Physical Exam: ? ?Unchanged ulcers on both feet ?Groin cannulation site stable without hematoma ? ? ? ? ? ? ?Assessment/Plan:   ? ?Bilateral lower extremity ulcers: I discussed the finding of this arteriogram today with the patient and his wife.  He is going to need a redo left femoral to below-knee popliteal artery bypass graft with Gore-Tex.  His prior femoral-popliteal bypass graft has occluded.  This was a silent occlusion.  He also has a occlusion of his tibioperoneal trunk for a short distance approximately 3 cm.  The peroneal artery is the single-vessel runoff.  I discussed that at the time of his bypass, I would attempt endovascular recanalization of his occluded tibioperoneal trunk and subsequent balloon angioplasty.  From my perspective, the patient can go home.  He will need to be off of his Plavix prior to his surgery.  He will also need to be off of his Eliquis which he takes for atrial fibrillation. ? ?Annamarie Major ?07/29/2021 ?6:54 PM ?-- ? ?Vitals:  ? 07/29/21 1400 07/29/21 1500  ?BP: 118/89 (!) 132/59  ?Pulse: 97   ?Resp: 20   ?Temp:    ?SpO2:    ? ? ?Intake/Output Summary (Last 24 hours) at 07/29/2021 1854 ?Last data filed at 07/29/2021 1800 ?Gross per 24 hour  ?Intake 3770.47 ml  ?Output 2400 ml  ?Net 1370.47 ml  ? ? ? ?Laboratory ?CBC ?   ?Component Value Date/Time  ? WBC 7.8 07/29/2021 0206  ? HGB 12.6 (L) 07/29/2021 0206  ? HGB 11.7 (L) 05/27/2021 2671  ? HCT 41.3 07/29/2021 0206  ? HCT 40.8 05/27/2021 0938  ? PLT 279 07/29/2021 0206  ? PLT 335 05/27/2021 0938  ? ? ?BMET ?   ?Component Value Date/Time  ? NA 134 (L) 07/29/2021 0206  ? NA 139 05/27/2021 0938  ? K 4.2 07/29/2021 0206  ? CL 100 07/29/2021 0206  ? CO2 25 07/29/2021 0206  ? GLUCOSE 166 (H) 07/29/2021 0206  ? BUN 26 (H) 07/29/2021 0206  ? BUN 23 05/27/2021 0938  ? CREATININE 1.74 (H) 07/29/2021 0206  ? CALCIUM 9.5 07/29/2021 0206  ?  GFRNONAA 42 (L) 07/29/2021 0206  ? GFRAA 54 (L) 06/20/2020 1529  ? ? ?COAG ?Lab Results  ?Component Value Date  ? INR 1.1 11/20/2020  ? INR 1.3 (H) 05/23/2020  ? INR 0.99 10/25/2011  ? ?No results found for: PTT ? ?Antibiotics ?Anti-infectives (From admission, onward)  ? ? Start     Dose/Rate Route Frequency Ordered Stop  ? 07/29/21 0000  amoxicillin (AMOXIL) 500 MG capsule       ? 500 mg Oral 3 times daily 07/29/21 1512 08/08/21 2359  ? 07/28/21 1200  penicillin G potassium 12 Million Units in dextrose 5 % 500 mL continuous infusion       ? 12 Million Units ?41.7 mL/hr over 12 Hours Intravenous Every 12 hours 07/28/21 1107    ? 07/26/21 1700  vancomycin (VANCOREADY) IVPB 1500 mg/300 mL  Status:  Discontinued       ? 1,500 mg ?150 mL/hr over 120 Minutes Intravenous Every 24 hours 07/25/21 1621 07/26/21 0838  ? 07/26/21 0930  penicillin G potassium 4 Million Units in dextrose 5 % 250 mL IVPB  Status:  Discontinued       ? 4 Million Units ?250 mL/hr over 60 Minutes Intravenous Every 4 hours 07/26/21 0838 07/28/21 1107  ?  07/25/21 1700  metroNIDAZOLE (FLAGYL) IVPB 500 mg  Status:  Discontinued       ?See Hyperspace for full Linked Orders Report.  ? 500 mg ?100 mL/hr over 60 Minutes Intravenous Every 12 hours 07/25/21 1617 07/26/21 0838  ? 07/25/21 1630  cefTRIAXone (ROCEPHIN) 2 g in sodium chloride 0.9 % 100 mL IVPB  Status:  Discontinued       ?See Hyperspace for full Linked Orders Report.  ? 2 g ?200 mL/hr over 30 Minutes Intravenous Every 24 hours 07/25/21 1617 07/26/21 0838  ? 07/25/21 1615  vancomycin (VANCOREADY) IVPB 2000 mg/400 mL       ? 2,000 mg ?200 mL/hr over 120 Minutes Intravenous  Once 07/25/21 1605 07/25/21 2021  ? ?  ? ? ? ?V. Leia Alf, M.D., FACS ?[facility] ?Office: [phone] ?Pager:  [phone]  ?

## 2021-07-29 NOTE — Progress Notes (Signed)
? ? ?Subjective  -  ? ?Patient now back in his room following angiogram today without complaints. ? ? ?Physical Exam: ? ?Unchanged ulcers on both feet ?Groin cannulation site stable without hematoma ? ? ? ? ? ? ?Assessment/Plan:   ? ?Bilateral lower extremity ulcers: I discussed the finding of this arteriogram today with the patient and his wife.  He is going to need a redo left femoral to below-knee popliteal artery bypass graft with Gore-Tex.  His prior femoral-popliteal bypass graft has occluded.  This was a silent occlusion.  He also has a occlusion of his tibioperoneal trunk for a short distance approximately 3 cm.  The peroneal artery is the single-vessel runoff.  I discussed that at the time of his bypass, I would attempt endovascular recanalization of his occluded tibioperoneal trunk and subsequent balloon angioplasty.  From my perspective, the patient can go home.  He will need to be off of his Plavix prior to his surgery.  He will also need to be off of his Eliquis which he takes for atrial fibrillation. ? ?Annamarie Major ?07/29/2021 ?6:54 PM ?-- ? ?Vitals:  ? 07/29/21 1400 07/29/21 1500  ?BP: 118/89 (!) 132/59  ?Pulse: 97   ?Resp: 20   ?Temp:    ?SpO2:    ? ? ?Intake/Output Summary (Last 24 hours) at 07/29/2021 1854 ?Last data filed at 07/29/2021 1800 ?Gross per 24 hour  ?Intake 3770.47 ml  ?Output 2400 ml  ?Net 1370.47 ml  ? ? ? ?Laboratory ?CBC ?   ?Component Value Date/Time  ? WBC 7.8 07/29/2021 0206  ? HGB 12.6 (L) 07/29/2021 0206  ? HGB 11.7 (L) 05/27/2021 3557  ? HCT 41.3 07/29/2021 0206  ? HCT 40.8 05/27/2021 0938  ? PLT 279 07/29/2021 0206  ? PLT 335 05/27/2021 0938  ? ? ?BMET ?   ?Component Value Date/Time  ? NA 134 (L) 07/29/2021 0206  ? NA 139 05/27/2021 0938  ? K 4.2 07/29/2021 0206  ? CL 100 07/29/2021 0206  ? CO2 25 07/29/2021 0206  ? GLUCOSE 166 (H) 07/29/2021 0206  ? BUN 26 (H) 07/29/2021 0206  ? BUN 23 05/27/2021 0938  ? CREATININE 1.74 (H) 07/29/2021 0206  ? CALCIUM 9.5 07/29/2021 0206  ?  GFRNONAA 42 (L) 07/29/2021 0206  ? GFRAA 54 (L) 06/20/2020 1529  ? ? ?COAG ?Lab Results  ?Component Value Date  ? INR 1.1 11/20/2020  ? INR 1.3 (H) 05/23/2020  ? INR 0.99 10/25/2011  ? ?No results found for: PTT ? ?Antibiotics ?Anti-infectives (From admission, onward)  ? ? Start     Dose/Rate Route Frequency Ordered Stop  ? 07/29/21 0000  amoxicillin (AMOXIL) 500 MG capsule       ? 500 mg Oral 3 times daily 07/29/21 1512 08/08/21 2359  ? 07/28/21 1200  penicillin G potassium 12 Million Units in dextrose 5 % 500 mL continuous infusion       ? 12 Million Units ?41.7 mL/hr over 12 Hours Intravenous Every 12 hours 07/28/21 1107    ? 07/26/21 1700  vancomycin (VANCOREADY) IVPB 1500 mg/300 mL  Status:  Discontinued       ? 1,500 mg ?150 mL/hr over 120 Minutes Intravenous Every 24 hours 07/25/21 1621 07/26/21 0838  ? 07/26/21 0930  penicillin G potassium 4 Million Units in dextrose 5 % 250 mL IVPB  Status:  Discontinued       ? 4 Million Units ?250 mL/hr over 60 Minutes Intravenous Every 4 hours 07/26/21 0838 07/28/21 1107  ?  07/25/21 1700  metroNIDAZOLE (FLAGYL) IVPB 500 mg  Status:  Discontinued       ?See Hyperspace for full Linked Orders Report.  ? 500 mg ?100 mL/hr over 60 Minutes Intravenous Every 12 hours 07/25/21 1617 07/26/21 0838  ? 07/25/21 1630  cefTRIAXone (ROCEPHIN) 2 g in sodium chloride 0.9 % 100 mL IVPB  Status:  Discontinued       ?See Hyperspace for full Linked Orders Report.  ? 2 g ?200 mL/hr over 30 Minutes Intravenous Every 24 hours 07/25/21 1617 07/26/21 0838  ? 07/25/21 1615  vancomycin (VANCOREADY) IVPB 2000 mg/400 mL       ? 2,000 mg ?200 mL/hr over 120 Minutes Intravenous  Once 07/25/21 1605 07/25/21 2021  ? ?  ? ? ? ?V. Leia Alf, M.D., FACS ?[facility] ?Office: [phone] ?Pager:  [phone]  ?

## 2021-07-29 NOTE — Interval H&P Note (Signed)
History and Physical Interval Note: ? ?07/29/2021 ?8:29 AM ? 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  The various methods of treatment have been discussed with the patient and family. After consideration of risks, benefits and other options for treatment, the patient has consented to  Procedure(s): ?ABDOMINAL AORTOGRAM W/LOWER EXTREMITY (N/A) as a surgical intervention.  The patient's history has been reviewed, patient examined, no change in status, stable for surgery.  I have reviewed the patient's chart and labs.  Questions were answered to the patient's satisfaction.   ? ? ?[name] [name] ? ? ?

## 2021-07-29 NOTE — Progress Notes (Signed)
Patient given discharge instructions. Wife present. Telemetry box removed, CCMD notified. PIVs removed. Patient taken to vehicle in wheelchair by staff. ? ?Daymon Larsen, RN  ?

## 2021-07-29 NOTE — Discharge Summary (Signed)
Physician Discharge Summary  ?[name] [name] GQQ:[id number] DOB: 03-Jul-1950 DOA: 07/25/2021 ? ?PCP: [name], FNP ? ?Admit date: 07/25/2021 ?Discharge date: 07/29/2021 ? ?Admitted From: Home ?Disposition:  Home  ? ?Recommendations for Outpatient Follow-up:  ?Follow up with PCP in 1-2 weeks ?Please obtain BMP/CBC in one week ?3.   Patient to follow with cardiology as an outpatient regarding further work-up for CAD, new drop in EF and regional wall motion abnormality, he is currently medically optimized, discussed with cardiology coordinator who will arrange early appointment next week ?4.    Patient to follow with vascular surgery after cardiology evaluation and preop clearance for vascular surgery. ? ?Home Health: Initial recommendation for home with home health PT, but patient requesting outpatient PT which has been arranged. ? ? ?Discharge Condition:Stable ?CODE STATUS:FULL ?Diet recommendation: Heart Healthy / Carb Modified  ? ?Brief/Interim Summary: ? ?[name] [name] is a 71 y.o. male with medical history significant of paroxysmal atrial fibrillation, h/o NSTEMI with coronary artery disease with multiple stents, diabetes, diastolic heart failure, peripheral artery occlusive disease with recent femoropopliteal bypass with multiple skin ulcerations and necrotic areas on feet bilaterally.  Patient sent to the emergency department from family medicine office due to concerns for sepsis.  Patient has had severe fatigue and fever and rigors over the past 3 days with worsening lower extremity pain left greater than right.  He has multiple ulcerations that have been weeping and oozing.  Patient was mildly hypotensive on arrival with tachycardia.  This improved with IV fluids.  Patient was started on vancomycin.  X-rays obtained showed no x-Erin evidence of osteomyelitis. ?  ?  ?Bilateral lower extremity wound with strep B bacteremia bacteremia  ?-With wounds appear to be infected, with blood culture growing Streptococcus  agalactia ?-Narrowed antibiotic to cultures, transitioned from vancomycin, cefepime and Flagyl, to  penicillin G, patient to be discharged on oral amoxicillin to finish total of 14 days of antibiotics, 2D echo with no evidence of endocarditis. ?-No evidence of osteomyelitis on left foot MRI ?  ?  ?Peripheral artery occlusive disease status post femoropopliteal bypass ?-Patient was seen by vascular surgery, Eliquis was held, he was kept on heparin GTT, he went for angiogram today which was significant for occluded left femoral-popliteal bypass graft, with occluded right tibioperoneal trunk, recommendation for redo left femoral-popliteal bypass with recanalization of the right tibioperoneal trunk, patient was adamant about leaving the hospital did not want to stay for surgery this Friday, so arrangement has been made for outpatient, but given abnormal finding in 2D echo and need of cardiology evaluation and clearance procedure has to be postponed till cardiac work-up is complete, so meanwhile he can continue Plavix and Eliquis.. ?  ?Paroxysmal atrial fibrillation on anticoagulation ?-Currently normal sinus rhythm ?-Continue with Toprol-XL ?-On Eliquis, ? ?CAD, chronic systolic CHF and regional wall motion abnormalities ? -2D echo was obtained this admission significant for drop in EF 40 to 45%, with regional wall motion abnormalities, I have discussed with the patient the need of further work-up, but patient was adamant about going home today, he did not want to wait for official cardiology Consultation, so I have discussed with cardiology on-call, cardiology coordinator for close outpatient follow-up. ?-Patient is currently medically optimized on Eliquis, Plavix, metoprolol, Imdur, Farxiga, ezetimibe and fenofibrate, and cardiology to arrange for early follow-up appointment. ?I have discussed with the patient at length, wife at bedside, have encouraged him to stay till he get cardiac work-up and clearance first and  then  to have his redo femoral-popliteal bypass during hospital stay, he is adamant about leaving today. ? ?Diabetes with nephropathy ?-Continue with home medication insulin on Farxiga ?  ?AKI in CKD stage IIIa ?-Baseline creatinine 1.5, up to 1.9, improved with IV fluids  ?  ?Morbid obesity ?  ?Hypertension ?-Continue with Imdur and metoprolol ?  ?Hyponatremia ?-We will start on IV normal saline ?  ?Hyperkalemia ?-Recheck in a.m. ?  ?Hypertension ?-Continue with Imdur and metoprolol ?  ? ?  ?Discharge Diagnoses:  ?Principal Problem: ?  Leg wound, left ?Active Problems: ?  Hypertension associated with diabetes ([place]) ?  GERD (gastroesophageal reflux disease) ?  Diabetic peripheral neuropathy associated with type 2 diabetes mellitus ([place]) ?  Diabetic neuropathy ([place]) ?  Diabetes ([place]) ?  Morbid obesity ([place]) ?  CHF (congestive heart failure) ([place]) ?  PAF (paroxysmal atrial fibrillation) ([place]) ?  S/P femoral-popliteal bypass surgery ?  PAOD (peripheral arterial occlusive disease) ([place]) ? ? ? ?Discharge Instructions ? ?Discharge Instructions   ? ? Ambulatory referral to Physical Therapy   Complete by: As directed ?  ? Needs PT/OT  ? Diet - low sodium heart healthy   Complete by: As directed ?  ? Discharge instructions   Complete by: As directed ?  ? Follow with Primary MD [name], FNP in 7 days  ? ?Get CBC, CMP, 2 view Chest X [place] checked  by Primary MD next visit.  ? ? ?Activity: As tolerated with Full fall precautions use walker/cane & assistance as needed ? ? ?Disposition Home  ? ? ?Diet: Heart Healthy  , with feeding assistance and aspiration precautions. ? ?For Heart failure patients - Check your Weight same time everyday, if you gain over 2 pounds, or you develop in leg swelling, experience more shortness of breath or chest pain, call your Primary MD immediately. Follow Cardiac Low Salt Diet and 1.5 lit/day fluid restriction. ? ? ?On your next visit with your primary care physician please Get Medicines  reviewed and adjusted. ? ? ?Please request your Prim.MD to go over all Hospital Tests and Procedure/Radiological results at the follow up, please get all Hospital records sent to your Prim MD by signing hospital release before you go home. ? ? ?If you experience worsening of your admission symptoms, develop shortness of breath, life threatening emergency, suicidal or homicidal thoughts you must seek medical attention immediately by calling 911 or calling your MD immediately  if symptoms less severe. ? ?You Must read complete instructions/literature along with all the possible adverse reactions/side effects for all the Medicines you take and that have been prescribed to you. Take any new Medicines after you have completely understood and accpet all the possible adverse reactions/side effects.  ? ?Do not drive, operating heavy machinery, perform activities at heights, swimming or participation in water activities or provide baby sitting services if your were admitted for syncope or siezures until you have seen by Primary MD or a Neurologist and advised to do so again. ? ?Do not drive when taking Pain medications.  ? ? ?Do not take more than prescribed Pain, Sleep and Anxiety Medications ? ?Special Instructions: If you have smoked or chewed Tobacco  in the last 2 yrs please stop smoking, stop any regular Alcohol  and or any Recreational drug use. ? ?Wear Seat belts while driving. ? ? ?Please note ? ?You were cared for by a hospitalist during your hospital stay. If you have any questions about your discharge medications or the  care you received while you were in the hospital after you are discharged, you can call the unit and asked to speak with the hospitalist on call if the hospitalist that took care of you is not available. Once you are discharged, your primary care physician will handle any further medical issues. Please note that NO REFILLS for any discharge medications will be authorized once you are discharged,  as it is imperative that you return to your primary care physician (or establish a relationship with a primary care physician if you do not have one) for your aftercare needs so that they can reassess your

## 2021-07-29 NOTE — Progress Notes (Signed)
Patient brought to 4E from Cath Lab. Wife present. Telemetry box applied, CCMD notified. BP 172/106. Hydralazine given. Will continue to monitor. Patient oriented to room and staff. Call bell in reach. ? ?Daymon Larsen, RN  ?

## 2021-07-29 NOTE — Discharge Instructions (Addendum)
? ? ?  Follow with Primary MD [name], FNP in 7 days  ? ?Get CBC, CMP, 2 view Chest X [place] checked  by Primary MD next visit.  ? ? ?Activity: As tolerated with Full fall precautions use walker/cane & assistance as needed ? ? ?Disposition Home  ? ? ?Diet: Heart Healthy  , with feeding assistance and aspiration precautions. ? ?For Heart failure patients - Check your Weight same time everyday, if you gain over 2 pounds, or you develop in leg swelling, experience more shortness of breath or chest pain, call your Primary MD immediately. Follow Cardiac Low Salt Diet and 1.5 lit/day fluid restriction. ? ? ?On your next visit with your primary care physician please Get Medicines reviewed and adjusted. ? ? ?Please request your Prim.MD to go over all Hospital Tests and Procedure/Radiological results at the follow up, please get all Hospital records sent to your Prim MD by signing hospital release before you go home. ? ? ?If you experience worsening of your admission symptoms, develop shortness of breath, life threatening emergency, suicidal or homicidal thoughts you must seek medical attention immediately by calling 911 or calling your MD immediately  if symptoms less severe. ? ?You Must read complete instructions/literature along with all the possible adverse reactions/side effects for all the Medicines you take and that have been prescribed to you. Take any new Medicines after you have completely understood and accpet all the possible adverse reactions/side effects.  ? ?Do not drive, operating heavy machinery, perform activities at heights, swimming or participation in water activities or provide baby sitting services if your were admitted for syncope or siezures until you have seen by Primary MD or a Neurologist and advised to do so again. ? ?Do not drive when taking Pain medications.  ? ? ?Do not take more than prescribed Pain, Sleep and Anxiety Medications ? ?Special Instructions: If you have smoked or chewed  Tobacco  in the last 2 yrs please stop smoking, stop any regular Alcohol  and or any Recreational drug use. ? ?Wear Seat belts while driving. ? ? ?Please note ? ?You were cared for by a hospitalist during your hospital stay. If you have any questions about your discharge medications or the care you received while you were in the hospital after you are discharged, you can call the unit and asked to speak with the hospitalist on call if the hospitalist that took care of you is not available. Once you are discharged, your primary care physician will handle any further medical issues. Please note that NO REFILLS for any discharge medications will be authorized once you are discharged, as it is imperative that you return to your primary care physician (or establish a relationship with a primary care physician if you do not have one) for your aftercare needs so that they can reassess your need for medications and monitor your lab values.  ?

## 2021-07-29 NOTE — Progress Notes (Signed)
Echocardiogram ?2D Echocardiogram has been performed. ? ?Arlyss Gandy ?07/29/2021, 3:56 PM ?

## 2021-07-29 NOTE — Op Note (Signed)
? ? ?  Patient name: [name] [name] MRN: [id number] DOB: 1951/04/29 Sex: male ? ?07/25/2021 - 07/29/2021 ?Pre-operative Diagnosis: Bilateral ulcers ?Post-operative diagnosis:  Same ?Surgeon:  Annamarie Major ?Procedure Performed: ? 1.  Ultrasound-guided access, right femoral artery ? 2.  Abdominal aortogram ? 3.  Bilateral lower extremity runoff ? 4.  Second-order catheterization ? 5.  Conscious sedation, 47 minutes ? ? ?Indications: This is a 71 year old gentleman with bilateral lower extremity ulcers.  He is recently status post left femoropopliteal bypass graft.  He comes in today for further evaluation ? ?Procedure:  The patient was identified in the holding area and taken to room 8.  The patient was then placed supine on the table and prepped and draped in the usual sterile fashion.  A time out was called.  Conscious sedation was administered with the use of IV fentanyl and Versed under continuous physician and nurse monitoring.  Heart rate, blood pressure, and oxygen saturation were continuously monitored.  Total sedation time was 47 minutes.  Ultrasound was used to evaluate the right common femoral artery.  It was patent .  A digital ultrasound image was acquired.  A micropuncture needle was used to access the right common femoral artery under ultrasound guidance.  An 018 wire was advanced without resistance and a micropuncture sheath was placed.  The 018 wire was removed and a benson wire was placed.  The micropuncture sheath was exchanged for a 5 french sheath.  An omniflush catheter was advanced over the wire to the level of L-1.  An abdominal angiogram with CO2 was obtained.  Next, using the omniflush catheter and a benson wire, the aortic bifurcation was crossed and the catheter was placed into theleft external iliac artery and left runoff was obtained.  right runoff was performed via retrograde sheath injections. ? ?Findings:  ? Aortogram: No obvious renal artery stenosis was identified.  The infrarenal  abdominal aorta appears to be widely patent.  No significant iliac disease was identified. ? Right Lower Extremity: The right common femoral and profundofemoral artery are widely patent.  The superficial femoral artery and the bypass graft are occluded at the origin.  There is reconstitution of the above-knee popliteal artery.  The below-knee popliteal arteries widely patent.  There is single-vessel runoff via the peroneal artery.  There does appear to be a stenosis at the tibioperoneal trunk. ? Left Lower Extremity: The left common femoral and profundofemoral artery are widely patent.  The superficial femoral artery is patent throughout its course with mild diffuse disease.  The stent is widely patent.  Popliteal artery is patent throughout its course.  There is a short segment occlusion of the tibioperoneal trunk with reconstitution of the peroneal artery which is the single-vessel runoff ? ?Intervention: None ? ?Impression: ? #1  Occluded left femoral-popliteal bypass graft ? #2  Occluded right tibioperoneal trunk ? #3  The patient will require redo left femoral-popliteal bypass graft.  Simultaneously I will try to recanalize the right tibioperoneal trunk. ? ? ?V. Annamarie Major, M.D., FACS ?[facility] ?Office: [phone] ?Pager:  [phone]  ?

## 2021-07-30 ENCOUNTER — Telehealth: Payer: Self-pay | Admitting: Cardiovascular Disease

## 2021-07-30 NOTE — Telephone Encounter (Signed)
Patient's wife states she assumes the patient may need to have a heart cath prior to 3/17  Left Femoral-Popliteal Bypass with Dr. [name] and she would like a call back to discuss further.  ?

## 2021-07-30 NOTE — Telephone Encounter (Signed)
Per Dr. [name], have him see DOD this week and arrange cath next available, myovue useless as he has collaterals to his RCA/LCX.  If stents patient do not have to resume plavix right away for surgery and would still have to hold his anticoagulation for 3 days before vascular surgery. ?

## 2021-07-30 NOTE — Telephone Encounter (Signed)
Called patient's wife (DPR) back. She is stating that patient needs heart cath and cardiology pre op clearance before patient can have his vascular study. Reviewed discharge note from yesterday. ? ?"Patient to follow with cardiology as an outpatient regarding further work-up for CAD, new drop in EF and regional wall motion abnormality, he is currently medically optimized, discussed with cardiology coordinator who will arrange early appointment next week" ? ?Patient has appointment on Wednesday of next week with Christen Bame NP. Patient's wife feels like patient should be seen sooner than this and would like advisement from Dr. [name]. ?

## 2021-07-31 ENCOUNTER — Telehealth: Payer: Self-pay

## 2021-07-31 LAB — CULTURE, BLOOD (ROUTINE X 2)
Culture: NO GROWTH
Special Requests: ADEQUATE

## 2021-07-31 NOTE — H&P (View-Only) (Signed)
? ?Chief Complaint  ?Patient presents with  ? Follow-up  ?  CAD  ? ?History of Present Illness: 71 yo male with history of PAF, HTN, HLD, DM, prior CVA, PAD and CAD here today for follow up as an add on the DOD schedule. He is followed in our office by Dr. [name]. Cardiac cath in January 2022 with moderate mid LAD stenosis, chronically occluded Diagonal branch, CTO RCA and severe restenosis in the Circumflex and OM stents. 2 new drug eluting stents were placed in the Circumflex/OM. He has atrial fib and is followed in the EP clinic by Dr. Lovena Le. Severe PAD followed by Dr. [name] with left fem pop bypass and right SFA July 2022. He has been on Plavix and Eliquis. He was admitted to [facility] 07/25/21 with lower extremity wounds, fever, rigors and sepsis. No evidence of osteomyelitis. He was treated with IV antibiotics. He was seen by Vascular surgery and lower extremity angiogram showed occluded left femoral-popliteal bypass graft and occluded right tibioperoneal trunk. Planning for redo left fem-pop bypass and PTA of the right lower extremity. Echo 3/14/3 with LVEF=40-45%. Hypokinesis of the posterior wall and basal inferior wall. Mild to moderate MR.  ? ?He was referred back to cardiology prior to his planned vascular surgery. Dr. [name] felt that he needed to be seen to have a repeat cardiac cath given findings on his echo. He tells me today that he has occasional chest pains but also has a hiatal hernia. He has baseline dyspnea. His legs are bandaged today. No LE edema.  ? ?Primary Care Physician: [name], FNP ?Primary Cardiologist: [name] ? ?Past Medical History:  ?Diagnosis Date  ? Anxiety   ? Arthritis   ? Atrial fibrillation ([place])   ? CAD (coronary artery disease)   ? a. 2010: DES to CTO of RCA. EF 55% b. 07/2016: cath showing total occlusion within previously placed RCA stent (collaterals present), severe stenosis along LCx and OM1 (treated with 2 overlapping DES). c. repeat cath in 01/2018 showing  patent stents along LCx and OM with CTO of D2, CTO of distal LCx, and CTO of RCA with collaterals present overall unchanged since 2018 with medical management recom  ? Cellulitis and abscess rt groin   ? Complication of anesthesia   ? " I woke up during a colonoscopy "     ? Depression   ? Diabetes mellitus   ? Diastolic CHF ([place])   ? Disorders of iron metabolism   ? Dysrhythmia   ? GERD (gastroesophageal reflux disease)   ? Hyperlipidemia   ? Hypertension   ? Low serum testosterone level   ? Medically noncompliant   ? Myocardial infarction [facility])   ? 05-23-20  ? ? ?Past Surgical History:  ?Procedure Laterality Date  ? ABDOMINAL AORTOGRAM W/LOWER EXTREMITY N/A 03/21/2019  ? Procedure: ABDOMINAL AORTOGRAM W/LOWER EXTREMITY;  Surgeon: [name], MD;  Location: [place] CV LAB;  Service: Cardiovascular;  Laterality: N/A;  ? ABDOMINAL AORTOGRAM W/LOWER EXTREMITY N/A 11/12/2020  ? Procedure: ABDOMINAL AORTOGRAM W/LOWER EXTREMITY;  Surgeon: [name], MD;  Location: [place] CV LAB;  Service: Cardiovascular;  Laterality: N/A;  ? ABDOMINAL AORTOGRAM W/LOWER EXTREMITY N/A 01/28/2021  ? Procedure: ABDOMINAL AORTOGRAM W/LOWER EXTREMITY;  Surgeon: [name], MD;  Location: [place] CV LAB;  Service: Cardiovascular;  Laterality: N/A;  ? ABDOMINAL AORTOGRAM W/LOWER EXTREMITY Bilateral 07/29/2021  ? Procedure: ABDOMINAL AORTOGRAM W/LOWER EXTREMITY;  Surgeon: [name], MD;  Location: [facility] INVASIVE CV  LAB;  Service: Cardiovascular;  Laterality: Bilateral;  ? BACK SURGERY  2015  ? ACDF by Dr. [name]  ? COLONOSCOPY N/A 10/01/2014  ? Dr. [name]: multiple tubular adenomas removed, colonic diverticulosis, redundant colon. next tcs advised for 09/2017. PATIENT NEEDS PROPOFOL FOR FAILED CONSCIOUS SEDATION  ? CORONARY STENT INTERVENTION N/A 07/30/2016  ? Procedure: Coronary Stent Intervention;  Surgeon: [name], MD;  Location: [place] CV LAB;  Service: Cardiovascular;  Laterality: N/A;  ? CORONARY STENT  PLACEMENT  2000  ? By Dr. Olevia Perches  ? EP IMPLANTABLE DEVICE N/A 05/25/2016  ? Procedure: Loop Recorder Insertion;  Surgeon: [name], MD;  Location: [place] CV LAB;  Service: Cardiovascular;  Laterality: N/A;  ? ESOPHAGOGASTRODUODENOSCOPY    ? esophagus stretched remotely at [facility]  ? ESOPHAGOGASTRODUODENOSCOPY N/A 10/01/2014  ? Dr. [name]: patchy mottling/erythema and minimal polypoid appearance of gastric mucosa. bx with mild inlammation but no H.pylori  ? FEMORAL-POPLITEAL BYPASS GRAFT Left 11/22/2020  ? Procedure: LEFT FEMORAL-POPLITEAL BYPASS GRAFT;  Surgeon: [name], MD;  Location: [place];  Service: Vascular;  Laterality: Left;  ? HERNIA REPAIR  1751  ? umbilical  ? INSERTION OF ILIAC STENT Right 11/22/2020  ? Procedure: INSERTION OF ELUVIA STENT INTO RIGHT DISTAL SUPERFICIAL FEMORAL ARTERY;  Surgeon: [name], MD;  [facility];  Service: Vascular;  Laterality: Right;  ? LEFT HEART CATH AND CORONARY ANGIOGRAPHY N/A 07/30/2016  ? Procedure: Left Heart Cath and Coronary Angiography;  Surgeon: [name], MD;  Location: [place] CV LAB;  Service: Cardiovascular;  Laterality: N/A;  ? LEFT HEART CATH AND CORONARY ANGIOGRAPHY N/A 01/19/2018  ? Procedure: LEFT HEART CATH AND CORONARY ANGIOGRAPHY;  Surgeon: [name], MD;  Location: [place] CV LAB;  Service: Cardiovascular;  Laterality: N/A;  ? LEFT HEART CATH AND CORONARY ANGIOGRAPHY N/A 05/24/2020  ? Procedure: LEFT HEART CATH AND CORONARY ANGIOGRAPHY;  Surgeon: Burnell Blanks, MD;  Location: [place] CV LAB;  Service: Cardiovascular;  Laterality: N/A;  ? LESION REMOVAL    ? Lip and hand   ? LOWER EXTREMITY ANGIOGRAM Right 11/22/2020  ? Procedure: RIGHT LEG ANGIOGRAM;  Surgeon: [name], MD;  Location: [facility] OR;  Service: Vascular;  Laterality: Right;  ? LOWER EXTREMITY ANGIOGRAPHY N/A 04/18/2019  ? Procedure: LOWER EXTREMITY ANGIOGRAPHY;  Surgeon: [name], MD;  Location: [place] CV LAB;  Service:  Cardiovascular;  Laterality: N/A;  ? NECK SURGERY    ? PERIPHERAL VASCULAR BALLOON ANGIOPLASTY  04/18/2019  ? Procedure: PERIPHERAL VASCULAR BALLOON ANGIOPLASTY;  Surgeon: [name], MD;  Location: [place] CV LAB;  Service: Cardiovascular;;  ? PERIPHERAL VASCULAR BALLOON ANGIOPLASTY Left 11/12/2020  ? Procedure: PERIPHERAL VASCULAR BALLOON ANGIOPLASTY;  Surgeon: [name], MD;  Location: [place] CV LAB;  Service: Cardiovascular;  Laterality: Left;  Failed PTA of superficial femoral artery.  ? PERIPHERAL VASCULAR INTERVENTION Right 03/21/2019  ? Procedure: PERIPHERAL VASCULAR INTERVENTION;  Surgeon: [name], MD;  Location: [place] CV LAB;  Service: Cardiovascular;  Laterality: Right;  superficial femoral  ? ? ?Current Outpatient Medications  ?Medication Sig Dispense Refill  ? albuterol (VENTOLIN HFA) 108 (90 Base) MCG/ACT inhaler Inhale 2 puffs into the lungs every 6 (six) hours as needed for wheezing or shortness of breath. 8.5 g 3  ? amoxicillin (AMOXIL) 500 MG capsule Take 1 capsule (500 mg total) by mouth 3 (three) times daily for 10 days. 30 capsule 0  ? apixaban (ELIQUIS) 5  MG TABS tablet Take 1 tablet (5 mg total) by mouth 2 (two) times daily. 180 tablet 1  ? Chlorphen-Phenyleph-ASA (ALKA-SELTZER PLUS COLD PO) Take 2 tablets by mouth daily as needed (allergies).    ? clopidogrel (PLAVIX) 75 MG tablet TAKE 1 TABLET BY MOUTH DAILY (Patient taking differently: Take 75 mg by mouth daily.) 30 tablet 3  ? dapagliflozin propanediol (FARXIGA) 5 MG TABS tablet Take 1 tablet (5 mg total) by mouth daily before breakfast. 90 tablet 2  ? DULoxetine (CYMBALTA) 30 MG capsule Take 1 capsule (30 mg total) by mouth daily. 90 capsule 1  ? ezetimibe (ZETIA) 10 MG tablet Take 1 tablet (10 mg total) by mouth daily. 90 tablet 3  ? fenofibrate 160 MG tablet Take 1 tablet (160 mg total) by mouth daily. 90 tablet 3  ? furosemide (LASIX) 20 MG tablet Take Two Times Daily for 7 Days then take Daily  (Patient taking differently: Take 20 mg by mouth daily as needed for fluid. Take Two Times Daily for 7 Days then take Daily) 97 tablet 3  ? gabapentin (NEURONTIN) 300 MG capsule Take 1 capsule (300 mg total) by mouth 3 (th

## 2021-07-31 NOTE — Progress Notes (Signed)
? ?Chief Complaint  ?Patient presents with  ? Follow-up  ?  CAD  ? ?History of Present Illness: 71 yo male with history of PAF, HTN, HLD, DM, prior CVA, PAD and CAD here today for follow up as an add on the DOD schedule. He is followed in our office by Dr. [name]. Cardiac cath in January 2022 with moderate mid LAD stenosis, chronically occluded Diagonal branch, CTO RCA and severe restenosis in the Circumflex and OM stents. 2 new drug eluting stents were placed in the Circumflex/OM. He has atrial fib and is followed in the EP clinic by Dr. Lovena Le. Severe PAD followed by Dr. [name] with left fem pop bypass and right SFA July 2022. He has been on Plavix and Eliquis. He was admitted to [facility] 07/25/21 with lower extremity wounds, fever, rigors and sepsis. No evidence of osteomyelitis. He was treated with IV antibiotics. He was seen by Vascular surgery and lower extremity angiogram showed occluded left femoral-popliteal bypass graft and occluded right tibioperoneal trunk. Planning for redo left fem-pop bypass and PTA of the right lower extremity. Echo 3/14/3 with LVEF=40-45%. Hypokinesis of the posterior wall and basal inferior wall. Mild to moderate MR.  ? ?He was referred back to cardiology prior to his planned vascular surgery. Dr. [name] felt that he needed to be seen to have a repeat cardiac cath given findings on his echo. He tells me today that he has occasional chest pains but also has a hiatal hernia. He has baseline dyspnea. His legs are bandaged today. No LE edema.  ? ?Primary Care Physician: [name], FNP ?Primary Cardiologist: [name] ? ?Past Medical History:  ?Diagnosis Date  ? Anxiety   ? Arthritis   ? Atrial fibrillation ([place])   ? CAD (coronary artery disease)   ? a. 2010: DES to CTO of RCA. EF 55% b. 07/2016: cath showing total occlusion within previously placed RCA stent (collaterals present), severe stenosis along LCx and OM1 (treated with 2 overlapping DES). c. repeat cath in 01/2018 showing  patent stents along LCx and OM with CTO of D2, CTO of distal LCx, and CTO of RCA with collaterals present overall unchanged since 2018 with medical management recom  ? Cellulitis and abscess rt groin   ? Complication of anesthesia   ? " I woke up during a colonoscopy "     ? Depression   ? Diabetes mellitus   ? Diastolic CHF ([place])   ? Disorders of iron metabolism   ? Dysrhythmia   ? GERD (gastroesophageal reflux disease)   ? Hyperlipidemia   ? Hypertension   ? Low serum testosterone level   ? Medically noncompliant   ? Myocardial infarction [facility])   ? 05-23-20  ? ? ?Past Surgical History:  ?Procedure Laterality Date  ? ABDOMINAL AORTOGRAM W/LOWER EXTREMITY N/A 03/21/2019  ? Procedure: ABDOMINAL AORTOGRAM W/LOWER EXTREMITY;  Surgeon: [name], MD;  Location: [place] CV LAB;  Service: Cardiovascular;  Laterality: N/A;  ? ABDOMINAL AORTOGRAM W/LOWER EXTREMITY N/A 11/12/2020  ? Procedure: ABDOMINAL AORTOGRAM W/LOWER EXTREMITY;  Surgeon: [name], MD;  Location: [place] CV LAB;  Service: Cardiovascular;  Laterality: N/A;  ? ABDOMINAL AORTOGRAM W/LOWER EXTREMITY N/A 01/28/2021  ? Procedure: ABDOMINAL AORTOGRAM W/LOWER EXTREMITY;  Surgeon: [name], MD;  Location: [place] CV LAB;  Service: Cardiovascular;  Laterality: N/A;  ? ABDOMINAL AORTOGRAM W/LOWER EXTREMITY Bilateral 07/29/2021  ? Procedure: ABDOMINAL AORTOGRAM W/LOWER EXTREMITY;  Surgeon: [name], MD;  Location: [facility] INVASIVE CV  LAB;  Service: Cardiovascular;  Laterality: Bilateral;  ? BACK SURGERY  2015  ? ACDF by Dr. [name]  ? COLONOSCOPY N/A 10/01/2014  ? Dr. [name]: multiple tubular adenomas removed, colonic diverticulosis, redundant colon. next tcs advised for 09/2017. PATIENT NEEDS PROPOFOL FOR FAILED CONSCIOUS SEDATION  ? CORONARY STENT INTERVENTION N/A 07/30/2016  ? Procedure: Coronary Stent Intervention;  Surgeon: [name], MD;  Location: [place] CV LAB;  Service: Cardiovascular;  Laterality: N/A;  ? CORONARY STENT  PLACEMENT  2000  ? By Dr. Olevia Perches  ? EP IMPLANTABLE DEVICE N/A 05/25/2016  ? Procedure: Loop Recorder Insertion;  Surgeon: [name], MD;  Location: [place] CV LAB;  Service: Cardiovascular;  Laterality: N/A;  ? ESOPHAGOGASTRODUODENOSCOPY    ? esophagus stretched remotely at [facility]  ? ESOPHAGOGASTRODUODENOSCOPY N/A 10/01/2014  ? Dr. [name]: patchy mottling/erythema and minimal polypoid appearance of gastric mucosa. bx with mild inlammation but no H.pylori  ? FEMORAL-POPLITEAL BYPASS GRAFT Left 11/22/2020  ? Procedure: LEFT FEMORAL-POPLITEAL BYPASS GRAFT;  Surgeon: [name], MD;  Location: [place];  Service: Vascular;  Laterality: Left;  ? HERNIA REPAIR  8676  ? umbilical  ? INSERTION OF ILIAC STENT Right 11/22/2020  ? Procedure: INSERTION OF ELUVIA STENT INTO RIGHT DISTAL SUPERFICIAL FEMORAL ARTERY;  Surgeon: [name], MD;  Location: [place];  Service: Vascular;  Laterality: Right;  ? LEFT HEART CATH AND CORONARY ANGIOGRAPHY N/A 07/30/2016  ? Procedure: Left Heart Cath and Coronary Angiography;  Surgeon: [name], MD;  Location: [place] CV LAB;  Service: Cardiovascular;  Laterality: N/A;  ? LEFT HEART CATH AND CORONARY ANGIOGRAPHY N/A 01/19/2018  ? Procedure: LEFT HEART CATH AND CORONARY ANGIOGRAPHY;  Surgeon: [name], MD;  Location: [place] CV LAB;  Service: Cardiovascular;  Laterality: N/A;  ? LEFT HEART CATH AND CORONARY ANGIOGRAPHY N/A 05/24/2020  ? Procedure: LEFT HEART CATH AND CORONARY ANGIOGRAPHY;  Surgeon: Burnell Blanks, MD;  Location: [place] CV LAB;  Service: Cardiovascular;  Laterality: N/A;  ? LESION REMOVAL    ? Lip and hand   ? LOWER EXTREMITY ANGIOGRAM Right 11/22/2020  ? Procedure: RIGHT LEG ANGIOGRAM;  Surgeon: [name], MD;  Location: [facility] OR;  Service: Vascular;  Laterality: Right;  ? LOWER EXTREMITY ANGIOGRAPHY N/A 04/18/2019  ? Procedure: LOWER EXTREMITY ANGIOGRAPHY;  Surgeon: [name], MD;  Location: [place] CV LAB;  Service:  Cardiovascular;  Laterality: N/A;  ? NECK SURGERY    ? PERIPHERAL VASCULAR BALLOON ANGIOPLASTY  04/18/2019  ? Procedure: PERIPHERAL VASCULAR BALLOON ANGIOPLASTY;  Surgeon: [name], MD;  Location: [place] CV LAB;  Service: Cardiovascular;;  ? PERIPHERAL VASCULAR BALLOON ANGIOPLASTY Left 11/12/2020  ? Procedure: PERIPHERAL VASCULAR BALLOON ANGIOPLASTY;  Surgeon: [name], MD;  Location: [place] CV LAB;  Service: Cardiovascular;  Laterality: Left;  Failed PTA of superficial femoral artery.  ? PERIPHERAL VASCULAR INTERVENTION Right 03/21/2019  ? Procedure: PERIPHERAL VASCULAR INTERVENTION;  Surgeon: [name], MD;  Location: [place] CV LAB;  Service: Cardiovascular;  Laterality: Right;  superficial femoral  ? ? ?Current Outpatient Medications  ?Medication Sig Dispense Refill  ? albuterol (VENTOLIN HFA) 108 (90 Base) MCG/ACT inhaler Inhale 2 puffs into the lungs every 6 (six) hours as needed for wheezing or shortness of breath. 8.5 g 3  ? amoxicillin (AMOXIL) 500 MG capsule Take 1 capsule (500 mg total) by mouth 3 (three) times daily for 10 days. 30 capsule 0  ? apixaban (ELIQUIS) 5  MG TABS tablet Take 1 tablet (5 mg total) by mouth 2 (two) times daily. 180 tablet 1  ? Chlorphen-Phenyleph-ASA (ALKA-SELTZER PLUS COLD PO) Take 2 tablets by mouth daily as needed (allergies).    ? clopidogrel (PLAVIX) 75 MG tablet TAKE 1 TABLET BY MOUTH DAILY (Patient taking differently: Take 75 mg by mouth daily.) 30 tablet 3  ? dapagliflozin propanediol (FARXIGA) 5 MG TABS tablet Take 1 tablet (5 mg total) by mouth daily before breakfast. 90 tablet 2  ? DULoxetine (CYMBALTA) 30 MG capsule Take 1 capsule (30 mg total) by mouth daily. 90 capsule 1  ? ezetimibe (ZETIA) 10 MG tablet Take 1 tablet (10 mg total) by mouth daily. 90 tablet 3  ? fenofibrate 160 MG tablet Take 1 tablet (160 mg total) by mouth daily. 90 tablet 3  ? furosemide (LASIX) 20 MG tablet Take Two Times Daily for 7 Days then take Daily  (Patient taking differently: Take 20 mg by mouth daily as needed for fluid. Take Two Times Daily for 7 Days then take Daily) 97 tablet 3  ? gabapentin (NEURONTIN) 300 MG capsule Take 1 capsule (300 mg total) by mouth 3 (th

## 2021-07-31 NOTE — Telephone Encounter (Signed)
Transition Care Management Follow-up Telephone Call ?Date of discharge and from where: 07/29/21 - [facility] - LLE wound - sepsis ?How have you been since you were released from the hospital? Not feeling well - seeing cardiology tomorrow ?Any questions or concerns? No ? ?Items Reviewed: ?Did the pt receive and understand the discharge instructions provided? Yes  ?Medications obtained and verified? Yes  ?Other? No  ?Any new allergies since your discharge? No  ?Dietary orders reviewed? Yes ?Do you have support at home? Yes  ? ?Home Care and Equipment/Supplies: ?Were home health services ordered? no ? ?Were any new equipment or medical supplies ordered?  No ? ? ?Functional Questionnaire: (I = Independent and D = Dependent) ?ADLs: I ? ?Bathing/Dressing- I ? ?Meal Prep- I ? ?Eating- I ? ?Maintaining continence- I ? ?Transferring/Ambulation- I ? ?Managing Meds- I ? ?Follow up appointments reviewed: ? ?PCP Hospital f/u appt confirmed?  NO - patient has several appts coming up and feels like he may end up being readmitted- he will call us when he has time to come for appt.   ?[facility] f/u appt confirmed? Yes  Scheduled to see cardiology on 08/01/21 @ 11:30 and again 08/06/21 @ 8:45 ?Are transportation arrangements needed? No  ?If their condition worsens, is the pt aware to call PCP or go to the Emergency Dept.? Yes ?Was the patient provided with contact information for the PCP's office or ED? Yes ?Was to pt encouraged to call back with questions or concerns? Yes  ?

## 2021-08-01 ENCOUNTER — Ambulatory Visit: Payer: Medicare Other | Admitting: Cardiovascular Disease

## 2021-08-01 ENCOUNTER — Inpatient Hospital Stay: Admit: 2021-08-01 | Payer: Medicare Other | Admitting: Surgery

## 2021-08-01 ENCOUNTER — Encounter: Payer: Self-pay | Admitting: Cardiovascular Disease

## 2021-08-01 ENCOUNTER — Other Ambulatory Visit: Payer: Self-pay

## 2021-08-01 VITALS — BP 128/82 | HR 100 | Ht 75.0 in | Wt 299.0 lb

## 2021-08-01 DIAGNOSIS — I25110 Atherosclerotic heart disease of native coronary artery with unstable angina pectoris: Secondary | ICD-10-CM

## 2021-08-01 SURGERY — BYPASS GRAFT FEMORAL-POPLITEAL ARTERY
Anesthesia: General | Laterality: Right

## 2021-08-01 NOTE — Patient Instructions (Signed)
Medication Instructions:  ?No changes ?*If you need a refill on your cardiac medications before your next appointment, please call your pharmacy* ? ? ?Lab Work: ?none ?If you have labs (blood work) drawn today and your tests are completely normal, you will receive your results only by: ?MyChart Message (if you have MyChart) OR ?A paper copy in the mail ?If you have any lab test that is abnormal or we need to change your treatment, we will call you to review the results. ? ? ?Testing/Procedures: ?Your physician has requested that you have a cardiac catheterization. Cardiac catheterization is used to diagnose and/or treat various heart conditions. Doctors may recommend this procedure for a number of different reasons. The most common reason is to evaluate chest pain. Chest pain can be a symptom of coronary artery disease (CAD), and cardiac catheterization can show whether plaque is narrowing or blocking your heart?s arteries. This procedure is also used to evaluate the valves, as well as measure the blood flow and oxygen levels in different parts of your heart. For further information please visit [email or web address]. Please follow instruction sheet, as given. ? ? ?Follow-Up: ?At [facility], you and your health needs are our priority.  As part of our continuing mission to provide you with exceptional heart care, we have created designated Provider Care Teams.  These Care Teams include your primary Cardiologist (physician) and Advanced Practice Providers (APPs -  Physician Assistants and Nurse Practitioners) who all work together to provide you with the care you need, when you need it. ? ?Your next appointment:   ?Approximately 3 week(s) ? ?The format for your next appointment:   ?In Person ? ?Provider:   ?Jenkins Rouge, MD  ?Or Advanced Practice Provider for follow up post cath  ? ? ?Other Instructions ? ?[place] [facility] OFFICE ?[address], [address] ?[name] [address] ?Dept: [phone] ?Loc: [phone] ? ?[place]le A Lineback  08/01/2021 ? ?You are scheduled for a Cardiac Catheterization on Wednesday, March 22 with Dr. Lauree Chandler. ? ?1. Please arrive at the Main Entrance A at [facility]: [place], [place] [address] at 9:00 AM (This time is four hours before your procedure.  You will be receiving IV hydration). Free valet parking service is available.  ? ?Special note: Every effort is made to have your procedure done on time. Please understand that emergencies sometimes delay scheduled procedures. ? ?2. Diet: Do not eat solid foods after midnight.  You may have clear liquids until 5 AM upon the day of the procedure. ? ?3. Labs: done 07/28/21 ? ?4. Medication instructions in preparation for your procedure: ? ? Contrast Allergy: No ? ?HOLD ELIQUIS AFTER Sunday 08/03/21.  You will be instructed on restarting this. ? ?HOLD LASIX DAY OF PROCEDURE ? ?DO NOT TAKE ANY INSULIN DAY OF PROCEDURE ? ?On the morning of your procedure, take Plavix/Clopidogrel and any morning medicines NOT listed above.  You may use sips of water. ? ?5. Plan to go home the same day, you will only stay overnight if medically necessary. ?6. You MUST have a responsible adult to drive you home. ?7. An adult MUST be with you the first 24 hours after you arrive home. ?8. Bring a current list of your medications, and the last time and date medication taken. ?9. Bring ID and current insurance cards. ?10.Please wear clothes that are easy to get on and off and wear slip-on shoes. ? ?Thank  you for allowing [place] to care for you! ?  -- [place] [facility] ?  ?

## 2021-08-04 ENCOUNTER — Telehealth: Payer: Self-pay | Admitting: Family

## 2021-08-04 ENCOUNTER — Telehealth: Payer: Self-pay

## 2021-08-04 ENCOUNTER — Other Ambulatory Visit: Payer: Self-pay | Admitting: Family

## 2021-08-04 NOTE — Telephone Encounter (Signed)
Spoke with patient, appointment scheduled 08/11/21. ?

## 2021-08-04 NOTE — Telephone Encounter (Signed)
Patient has an appointment for a hospital follow up 08/11/21.  He is having problems with his nerves and anxiety since he was admitted into the hospital with sepsis.  I explained to him that he would need an office visit to discuss this but he asked that I ask you if something could be sent in or can he do a phone visit before the 28th (asap per patient) to discuss medication. ?

## 2021-08-05 ENCOUNTER — Telehealth: Payer: Self-pay | Admitting: *Deleted

## 2021-08-05 ENCOUNTER — Ambulatory Visit (INDEPENDENT_AMBULATORY_CARE_PROVIDER_SITE_OTHER): Payer: Medicare Other | Admitting: Family

## 2021-08-05 ENCOUNTER — Encounter: Payer: Self-pay | Admitting: Family

## 2021-08-05 DIAGNOSIS — F331 Major depressive disorder, recurrent, moderate: Secondary | ICD-10-CM | POA: Diagnosis not present

## 2021-08-05 DIAGNOSIS — F419 Anxiety disorder, unspecified: Secondary | ICD-10-CM | POA: Diagnosis not present

## 2021-08-05 DIAGNOSIS — I2510 Atherosclerotic heart disease of native coronary artery without angina pectoris: Secondary | ICD-10-CM | POA: Diagnosis not present

## 2021-08-05 DIAGNOSIS — Z9861 Coronary angioplasty status: Secondary | ICD-10-CM

## 2021-08-05 MED ORDER — DULOXETINE HCL 60 MG PO CPEP: 60.0000 mg | ORAL_CAPSULE | Freq: Every day | ORAL | 1 refills | Status: DC

## 2021-08-05 MED ORDER — BUSPIRONE HCL 7.5 MG PO TABS: 7.5000 mg | ORAL_TABLET | Freq: Three times a day (TID) | ORAL | 1 refills | Status: DC | PRN

## 2021-08-05 NOTE — Telephone Encounter (Signed)
Ok to schedule with me today

## 2021-08-05 NOTE — Telephone Encounter (Signed)
Patient aware, appointment scheduled for phone visit this afternoon with [facility]. ?

## 2021-08-05 NOTE — Telephone Encounter (Signed)
Cardiac Catheterization scheduled at [facility] for: Wednesday August 06, 2021 1 PM ?Arrival time and place: [facility] at: 8 AM-pre-procedure hydration ? ? ?No solid food after midnight prior to cath, clear liquids until 5 AM day of procedure. ? ?Medication instructions: ?-Hold: ? Eliquis-none 08/04/21 until post procedure ? Lasix/Farxiga- AM of procedure ? Insulin-AM of procedure/1/2 usual Insulin dose HS prior to procedure ?-Except hold medications usual morning medications can be taken with sips of water including aspirin 81 mg and Plavix 75 mg. ? ?Confirmed patient has responsible adult to drive home post procedure and be with patient first 24 hours after arriving home: ? ?One visitor is allowed to stay in the waiting room during the time you are at the hospital for your procedure.  ? ?Patient reports no new symptoms concerning for COVID-19/no exposure to COVID-19 in the past 10 days. ? ?Reviewed procedure instructions with patient. ?

## 2021-08-05 NOTE — Progress Notes (Signed)
? ?Virtual Visit  Note ?Due to COVID-19 pandemic this visit was conducted virtually. This visit type was conducted due to national recommendations for restrictions regarding the COVID-19 Pandemic (e.g. social distancing, sheltering in place) in an effort to limit this patient's exposure and mitigate transmission in our community. All issues noted in this document were discussed and addressed.  A physical exam was not performed with this format. ? ?I connected with [name] [name] on 08/05/21 at 1:47 pm  by telephone and verified that I am speaking with the correct person using two identifiers. [name] [name] is currently located at home and his wife is currently with him  during visit. The provider, [name], FNP is located in their office at time of visit. ? ?I discussed the limitations, risks, security and privacy concerns of performing an evaluation and management service by telephone and the availability of in person appointments. I also discussed with the patient that there may be a patient responsible charge related to this service. The patient expressed understanding and agreed to proceed. ? ?[name] [name],you are scheduled for a virtual visit with your provider today.   ? ?Just as we do with appointments in the office, we must obtain your consent to participate.  Your consent will be active for this visit and any virtual visit you may have with one of our providers in the next 365 days.   ? ?If you have a MyChart account, I can also send a copy of this consent to you electronically.  All virtual visits are billed to your insurance company just like a traditional visit in the office.  As this is a virtual visit, video technology does not allow for your provider to perform a traditional examination.  This may limit your provider's ability to fully assess your condition.  If your provider identifies any concerns that need to be evaluated in person or the need to arrange testing such as labs, EKG, etc, we will  make arrangements to do so.   ? ?Although advances in technology are sophisticated, we cannot ensure that it will always work on either your end or our end.  If the connection with a video visit is poor, we may have to switch to a telephone visit.  With either a video or telephone visit, we are not always able to ensure that we have a secure connection.   I need to obtain your verbal consent now.   Are you willing to proceed with your visit today?  ? ?[name] [name] has provided verbal consent on 08/05/2021 for a virtual visit (video or telephone). ? ? ?[name], FNP ?08/05/2021  1:49 PM ? ? ? ?History and Present Illness: ? ?PT calls the office today for increased GAD since his hospitalizations. He is scheduled for a heart cath 08/06/21 and is holding his Eliqus.  ? ?He states he is busting crying at times and feels like he doesn't want to continue going on.  ?Anxiety ?Presents for follow-up visit. Symptoms include depressed mood, excessive worry, irritability, nervous/anxious behavior, palpitations and restlessness. Symptoms occur occasionally. The severity of symptoms is moderate.  ? ? ?Depression ?       This is a chronic problem.  The current episode started more than 1 year ago.   Associated symptoms include helplessness, hopelessness, irritable, restlessness, decreased interest and sad.  Past treatments include SNRIs - Serotonin and norepinephrine reuptake inhibitors.  Compliance with treatment is good.  Past medical history includes anxiety.   ? ? ? ?  Review of Systems  ?Constitutional:  Positive for irritability.  ?Cardiovascular:  Positive for palpitations.  ?Psychiatric/Behavioral:  Positive for depression. The patient is nervous/anxious.   ? ? ?Observations/Objective: ?No SOB or distress noted, flat and anxious.  ? ?Assessment and Plan: ?1. Anxiety ? ?- DULoxetine (CYMBALTA) 60 MG capsule; Take 1 capsule (60 mg total) by mouth daily.  Dispense: 90 capsule; Refill: 1 ?- busPIRone (BUSPAR) 7.5 MG tablet;  Take 1 tablet (7.5 mg total) by mouth 3 (three) times daily as needed.  Dispense: 90 tablet; Refill: 1 ? ?2. Moderate episode of recurrent major depressive disorder ([place]) ?- DULoxetine (CYMBALTA) 60 MG capsule; Take 1 capsule (60 mg total) by mouth daily.  Dispense: 90 capsule; Refill: 1 ?- busPIRone (BUSPAR) 7.5 MG tablet; Take 1 tablet (7.5 mg total) by mouth 3 (three) times daily as needed.  Dispense: 90 tablet; Refill: 1 ? ?3. CAD S/P percutaneous coronary angioplasty ? ? ?Will increase Cymbalta to 60 mg from 30 mg  ?Buspar 7.5 mg TID prn  ?Stress management  ?Continue to hold Eliquis for cath procedure  ?Keep follow up with Cardiologists  ?  ?I discussed the assessment and treatment plan with the patient. The patient was provided an opportunity to ask questions and all were answered. The patient agreed with the plan and demonstrated an understanding of the instructions. ?  ?The patient was advised to call back or seek an in-person evaluation if the symptoms worsen or if the condition fails to improve as anticipated. ? ?The above assessment and management plan was discussed with the patient. The patient verbalized understanding of and has agreed to the management plan. Patient is aware to call the clinic if symptoms persist or worsen. Patient is aware when to return to the clinic for a follow-up visit. Patient educated on when it is appropriate to go to the emergency department.  ? ?Time call ended:  2:09 pm  ? ?I provided 22 minutes of  non face-to-face time during this encounter. ? ? ? ?[name], FNP ? ? ?

## 2021-08-06 ENCOUNTER — Ambulatory Visit: Payer: Medicare Other | Admitting: Nurse Practitioner

## 2021-08-06 ENCOUNTER — Ambulatory Visit (HOSPITAL_COMMUNITY)
Admission: RE | Admit: 2021-08-06 | Discharge: 2021-08-06 | Disposition: A | Discharge status: Home / Self Care | Payer: Medicare Other | Attending: Cardiovascular Disease | Admitting: Cardiovascular Disease

## 2021-08-06 ENCOUNTER — Encounter (HOSPITAL_COMMUNITY)
Admission: RE | Disposition: A | Discharge status: Home / Self Care | Payer: Self-pay | Source: Home / Self Care | Attending: Cardiovascular Disease

## 2021-08-06 ENCOUNTER — Other Ambulatory Visit: Payer: Self-pay

## 2021-08-06 DIAGNOSIS — E785 Hyperlipidemia, unspecified: Secondary | ICD-10-CM | POA: Diagnosis not present

## 2021-08-06 DIAGNOSIS — I25118 Atherosclerotic heart disease of native coronary artery with other forms of angina pectoris: Secondary | ICD-10-CM

## 2021-08-06 DIAGNOSIS — I480 Paroxysmal atrial fibrillation: Secondary | ICD-10-CM | POA: Diagnosis not present

## 2021-08-06 DIAGNOSIS — E119 Type 2 diabetes mellitus without complications: Secondary | ICD-10-CM | POA: Insufficient documentation

## 2021-08-06 DIAGNOSIS — Z7985 Long-term (current) use of injectable non-insulin antidiabetic drugs: Secondary | ICD-10-CM | POA: Diagnosis not present

## 2021-08-06 DIAGNOSIS — Z87891 Personal history of nicotine dependence: Secondary | ICD-10-CM | POA: Diagnosis not present

## 2021-08-06 DIAGNOSIS — Z955 Presence of coronary angioplasty implant and graft: Secondary | ICD-10-CM | POA: Insufficient documentation

## 2021-08-06 DIAGNOSIS — Z7901 Long term (current) use of anticoagulants: Secondary | ICD-10-CM | POA: Insufficient documentation

## 2021-08-06 DIAGNOSIS — Z7902 Long term (current) use of antithrombotics/antiplatelets: Secondary | ICD-10-CM | POA: Insufficient documentation

## 2021-08-06 DIAGNOSIS — I110 Hypertensive heart disease with heart failure: Secondary | ICD-10-CM | POA: Insufficient documentation

## 2021-08-06 DIAGNOSIS — Z8673 Personal history of transient ischemic attack (TIA), and cerebral infarction without residual deficits: Secondary | ICD-10-CM | POA: Diagnosis not present

## 2021-08-06 DIAGNOSIS — Z794 Long term (current) use of insulin: Secondary | ICD-10-CM | POA: Insufficient documentation

## 2021-08-06 DIAGNOSIS — I2582 Chronic total occlusion of coronary artery: Secondary | ICD-10-CM | POA: Diagnosis not present

## 2021-08-06 DIAGNOSIS — Z79899 Other long term (current) drug therapy: Secondary | ICD-10-CM | POA: Insufficient documentation

## 2021-08-06 DIAGNOSIS — I5032 Chronic diastolic (congestive) heart failure: Secondary | ICD-10-CM | POA: Insufficient documentation

## 2021-08-06 DIAGNOSIS — Z7984 Long term (current) use of oral hypoglycemic drugs: Secondary | ICD-10-CM | POA: Insufficient documentation

## 2021-08-06 DIAGNOSIS — I25110 Atherosclerotic heart disease of native coronary artery with unstable angina pectoris: Secondary | ICD-10-CM | POA: Diagnosis not present

## 2021-08-06 DIAGNOSIS — I252 Old myocardial infarction: Secondary | ICD-10-CM | POA: Insufficient documentation

## 2021-08-06 HISTORY — PX: LEFT HEART CATH AND CORONARY ANGIOGRAPHY: CATH118249

## 2021-08-06 LAB — GLUCOSE, CAPILLARY
Glucose-Capillary: 184 mg/dL — ABNORMAL HIGH (ref 70–99)
Glucose-Capillary: 170 mg/dL — ABNORMAL HIGH (ref 70–99)

## 2021-08-06 SURGERY — LEFT HEART CATH AND CORONARY ANGIOGRAPHY
Anesthesia: LOCAL

## 2021-08-06 MED ORDER — SODIUM CHLORIDE 0.9 % WEIGHT BASED INFUSION
3.0000 mL/kg/h | INTRAVENOUS | Status: AC
  Administered 2021-08-06: 3 mL/kg/h via INTRAVENOUS

## 2021-08-06 MED ORDER — VERAPAMIL HCL 2.5 MG/ML IV SOLN
INTRAVENOUS | Status: AC
  Filled 2021-08-06: qty 2

## 2021-08-06 MED ORDER — HEPARIN (PORCINE) IN NACL 1000-0.9 UT/500ML-% IV SOLN
INTRAVENOUS | Status: AC
  Filled 2021-08-06: qty 1000

## 2021-08-06 MED ORDER — FENTANYL CITRATE (PF) 100 MCG/2ML IJ SOLN
INTRAMUSCULAR | Status: AC
Start: 2021-08-06 — End: ?
  Filled 2021-08-06: qty 2

## 2021-08-06 MED ORDER — MIDAZOLAM HCL 2 MG/2ML IJ SOLN
INTRAMUSCULAR | Status: AC
  Filled 2021-08-06: qty 2

## 2021-08-06 MED ORDER — FENTANYL CITRATE (PF) 100 MCG/2ML IJ SOLN
INTRAMUSCULAR | Status: DC | PRN
  Administered 2021-08-06: 25 ug via INTRAVENOUS

## 2021-08-06 MED ORDER — HEPARIN (PORCINE) IN NACL 1000-0.9 UT/500ML-% IV SOLN
INTRAVENOUS | Status: DC | PRN
  Administered 2021-08-06 (×2): 500 mL

## 2021-08-06 MED ORDER — SODIUM CHLORIDE 0.9% FLUSH: 3.0000 mL | INTRAVENOUS | Status: DC | PRN

## 2021-08-06 MED ORDER — VERAPAMIL HCL 2.5 MG/ML IV SOLN
INTRAVENOUS | Status: DC | PRN
  Administered 2021-08-06: 10 mL via INTRA_ARTERIAL

## 2021-08-06 MED ORDER — ASPIRIN 81 MG PO CHEW: 81.0000 mg | CHEWABLE_TABLET | ORAL | Status: DC

## 2021-08-06 MED ORDER — IOHEXOL 350 MG/ML SOLN
INTRAVENOUS | Status: DC | PRN
  Administered 2021-08-06: 40 mL

## 2021-08-06 MED ORDER — LIDOCAINE HCL (PF) 1 % IJ SOLN
INTRAMUSCULAR | Status: AC
  Filled 2021-08-06: qty 30

## 2021-08-06 MED ORDER — SODIUM CHLORIDE 0.9 % IV SOLN: 250.0000 mL | INTRAVENOUS | Status: DC | PRN

## 2021-08-06 MED ORDER — HEPARIN SODIUM (PORCINE) 1000 UNIT/ML IJ SOLN
INTRAMUSCULAR | Status: AC
  Filled 2021-08-06: qty 10

## 2021-08-06 MED ORDER — SODIUM CHLORIDE 0.9 % IV SOLN
INTRAVENOUS | Status: DC | PRN
  Administered 2021-08-06: 10 mL/h via INTRAVENOUS

## 2021-08-06 MED ORDER — HYDROCODONE-ACETAMINOPHEN 10-325 MG PO TABS
1.0000 | ORAL_TABLET | Freq: Once | ORAL | Status: AC
  Administered 2021-08-06: 1 via ORAL
  Filled 2021-08-06: qty 1

## 2021-08-06 MED ORDER — HEPARIN SODIUM (PORCINE) 1000 UNIT/ML IJ SOLN
INTRAMUSCULAR | Status: DC | PRN
  Administered 2021-08-06: 7000 [IU] via INTRAVENOUS

## 2021-08-06 MED ORDER — SODIUM CHLORIDE 0.9 % WEIGHT BASED INFUSION: 1.0000 mL/kg/h | INTRAVENOUS | Status: DC

## 2021-08-06 MED ORDER — LIDOCAINE HCL (PF) 1 % IJ SOLN
INTRAMUSCULAR | Status: DC | PRN
  Administered 2021-08-06: 5 mL

## 2021-08-06 MED ORDER — MIDAZOLAM HCL 2 MG/2ML IJ SOLN
INTRAMUSCULAR | Status: DC | PRN
  Administered 2021-08-06: 1 mg via INTRAVENOUS

## 2021-08-06 MED ORDER — SODIUM CHLORIDE 0.9% FLUSH: 3.0000 mL | Freq: Two times a day (BID) | INTRAVENOUS | Status: DC

## 2021-08-06 SURGICAL SUPPLY — 10 items
CATH DIAG 6FR JR4 (CATHETERS) ×1 IMPLANT
CATH INFINITI 5 FR JL3.5 (CATHETERS) ×1 IMPLANT
GLIDESHEATH SLEND SS 6F .021 (SHEATH) ×1 IMPLANT
GUIDEWIRE INQWIRE 1.5J.035X260 (WIRE) IMPLANT
INQWIRE 1.5J .035X260CM (WIRE) ×2
KIT HEART LEFT (KITS) ×3 IMPLANT
PACK CARDIAC CATHETERIZATION (CUSTOM PROCEDURE TRAY) ×3 IMPLANT
SYR MEDRAD MARK 7 150ML (SYRINGE) ×3 IMPLANT
TRANSDUCER W/STOPCOCK (MISCELLANEOUS) ×3 IMPLANT
TUBING CIL FLEX 10 FLL-RA (TUBING) ×3 IMPLANT

## 2021-08-06 NOTE — Discharge Instructions (Addendum)
OK to resume Eliquis tomorrow (07/10/21) if no bleeding from his right wrist.  ?He should begin taking Lasix 40 mg daily.  ? ? ?Radial Site Care ? ?This sheet gives you information about how to care for yourself after your procedure. Your health care provider may also give you more specific instructions. If you have problems or questions, contact your health care provider. ?What can I expect after the procedure? ?After the procedure, it is common to have: ?Bruising and tenderness at the catheter insertion area. ?Follow these instructions at home: ?Medicines ?Take over-the-counter and prescription medicines only as told by your health care provider. ?Insertion site care ?Follow instructions from your health care provider about how to take care of your insertion site. Make sure you: ?Wash your hands with soap and water before you remove your bandage (dressing). If soap and water are not available, use hand sanitizer. ?May remove dressing in 24 hours. ?Check your insertion site every day for signs of infection. Check for: ?Redness, swelling, or pain. ?Fluid or blood. ?Pus or a bad smell. ?Warmth. ?Do no take baths, swim, or use a hot tub for 5 days. ?You may shower 24-48 hours after the procedure. ?Remove the dressing and gently wash the site with plain soap and water. ?Pat the area dry with a clean towel. ?Do not rub the site. That could cause bleeding. ?Do not apply powder or lotion to the site. ?Activity ? ?For 24 hours after the procedure, or as directed by your health care provider: ?Do not flex or bend the affected arm. ?Do not push or pull heavy objects with the affected arm. ?Do not drive yourself home from the hospital or clinic. You may drive 24 hours after the procedure. ?Do not operate machinery or power tools. ?KEEP ARM ELEVATED THE REMAINDER OF THE DAY. ?Do not push, pull or lift anything that is heavier than 10 lb for 5 days. ?Ask your health care provider when it is okay to: ?Return to work or  school. ?Resume usual physical activities or sports. ?Resume sexual activity. ?General instructions ?If the catheter site starts to bleed, raise your arm and put firm pressure on the site. If the bleeding does not stop, get help right away. This is a medical emergency. ?DRINK PLENTY OF FLUIDS FOR THE NEXT 2-3 DAYS. ?No alcohol consumption for 24 hours after receiving sedation. ?If you went home on the same day as your procedure, a responsible adult should be with you for the first 24 hours after you arrive home. ?Keep all follow-up visits as told by your health care provider. This is important. ?Contact a health care provider if: ?You have a fever. ?You have redness, swelling, or yellow drainage around your insertion site. ?Get help right away if: ?You have unusual pain at the radial site. ?The catheter insertion area swells very fast. ?The insertion area is bleeding, and the bleeding does not stop when you hold steady pressure on the area. ?Your arm or hand becomes pale, cool, tingly, or numb. ?These symptoms may represent a serious problem that is an emergency. Do not wait to see if the symptoms will go away. Get medical help right away. Call your local emergency services (911 in the [place] Do not drive yourself to the hospital. ?Summary ?After the procedure, it is common to have bruising and tenderness at the site. ?Follow instructions from your health care provider about how to take care of your radial site wound. Check the wound every day for signs of infection. ? ?  This information is not intended to replace advice given to you by your health care provider. Make sure you discuss any questions you have with your health care provider. ?Document Revised: 06/09/2017 Document Reviewed: 06/09/2017 ?Elsevier Patient Education ? [place].  ?

## 2021-08-06 NOTE — Interval H&P Note (Signed)
History and Physical Interval Note: ? ?08/06/2021 ?12:12 PM ? ?[name] [name]  has presented today for surgery, with the diagnosis of [place].  The various methods of treatment have been discussed with the patient and family. After consideration of risks, benefits and other options for treatment, the patient has consented to  Procedure(s): ?LEFT HEART CATH AND CORONARY ANGIOGRAPHY (N/A) as a surgical intervention.  The patient's history has been reviewed, patient examined, no change in status, stable for surgery.  I have reviewed the patient's chart and labs.  Questions were answered to the patient's satisfaction.   ? ?Cath Lab Visit (complete for each Cath Lab visit) ? ?Clinical Evaluation Leading to the Procedure:  ? ?ACS: No. ? ?Non-ACS:   ? ?Anginal Classification: CCS III ? ?Anti-ischemic medical therapy: Maximal Therapy (2 or more classes of medications) ? ?Non-Invasive Test Results: No non-invasive testing performed ? ?Prior CABG: No previous CABG ? ? ? ? ? ? ? ?Lauree Chandler ? ? ?

## 2021-08-07 ENCOUNTER — Other Ambulatory Visit: Payer: Self-pay

## 2021-08-07 ENCOUNTER — Encounter (HOSPITAL_COMMUNITY): Payer: Self-pay | Admitting: Cardiovascular Disease

## 2021-08-07 DIAGNOSIS — I7025 Atherosclerosis of native arteries of other extremities with ulceration: Secondary | ICD-10-CM

## 2021-08-08 ENCOUNTER — Other Ambulatory Visit: Payer: Self-pay | Admitting: Physician Assistant

## 2021-08-08 ENCOUNTER — Telehealth: Payer: Self-pay

## 2021-08-08 MED ORDER — AMOXICILLIN 500 MG PO CAPS: 500.0000 mg | ORAL_CAPSULE | Freq: Three times a day (TID) | ORAL | 0 refills | Status: DC

## 2021-08-08 NOTE — Telephone Encounter (Signed)
Pt's wife called to let us know pt finishes Amoxicillin tomorrow and his R toe is looking purple in color and toes on L are swollen with clear drainage from under the ball of the foot. MD has been made aware. Pt was ordered more Amoxicillin to take until surgery and was reminded to stop Plavix 5 days prior to surgery. No further questions at this time. ?

## 2021-08-08 NOTE — Progress Notes (Signed)
Pt discharged from hospital on Amoxicillin '500mg'$  tid.  His wife called and spoke with RN who spoke with Dr. [name].   Dr. [name] would like pt to continued 7 days more of abx as he is going to have surgery next Friday.  I have sent Amoxicillin '500mg'$  tid #21 no refill to [facility] in [place].  ? ?Leontine Locket, PAC ?08/08/2021 ?12:54 PM ? ?

## 2021-08-10 ENCOUNTER — Other Ambulatory Visit: Payer: Self-pay | Admitting: Family

## 2021-08-11 ENCOUNTER — Ambulatory Visit (INDEPENDENT_AMBULATORY_CARE_PROVIDER_SITE_OTHER): Payer: Medicare Other | Admitting: Family

## 2021-08-11 ENCOUNTER — Encounter: Payer: Self-pay | Admitting: Family

## 2021-08-11 VITALS — BP 112/69 | HR 90 | Temp 97.0°F | Ht 75.0 in | Wt 295.0 lb

## 2021-08-11 DIAGNOSIS — E1142 Type 2 diabetes mellitus with diabetic polyneuropathy: Secondary | ICD-10-CM

## 2021-08-11 DIAGNOSIS — I152 Hypertension secondary to endocrine disorders: Secondary | ICD-10-CM

## 2021-08-11 DIAGNOSIS — E1159 Type 2 diabetes mellitus with other circulatory complications: Secondary | ICD-10-CM

## 2021-08-11 DIAGNOSIS — Z09 Encounter for follow-up examination after completed treatment for conditions other than malignant neoplasm: Secondary | ICD-10-CM | POA: Diagnosis not present

## 2021-08-11 DIAGNOSIS — L97909 Non-pressure chronic ulcer of unspecified part of unspecified lower leg with unspecified severity: Secondary | ICD-10-CM | POA: Diagnosis not present

## 2021-08-11 DIAGNOSIS — G4700 Insomnia, unspecified: Secondary | ICD-10-CM | POA: Diagnosis not present

## 2021-08-11 DIAGNOSIS — Z794 Long term (current) use of insulin: Secondary | ICD-10-CM | POA: Diagnosis not present

## 2021-08-11 DIAGNOSIS — I25118 Atherosclerotic heart disease of native coronary artery with other forms of angina pectoris: Secondary | ICD-10-CM | POA: Diagnosis not present

## 2021-08-11 LAB — BAYER DCA HB A1C WAIVED: HB A1C (BAYER DCA - WAIVED): 7.5 % — ABNORMAL HIGH (ref 4.8–5.6)

## 2021-08-11 MED ORDER — TRAZODONE HCL 100 MG PO TABS: 100.0000 mg | ORAL_TABLET | Freq: Every day | ORAL | 1 refills | Status: DC

## 2021-08-11 NOTE — Patient Instructions (Signed)
Venous Ulcer ?A venous ulcer is a shallow sore on your lower leg that is caused by poor circulation in your veins. This condition used to be called stasis ulcer. ?Venous ulcer is the most common type of lower leg ulcer. You may have venous ulcers on one leg or on both legs. The area where this condition most commonly develops is around the ankles. A venous ulcer may last for a long time (chronic ulcer) or it may return repeatedly (recurrent ulcer). ?What are the causes? ?A venous ulcer may be caused by any condition that causes poor blood flow in your legs. Veins have valves that help return blood to the heart. If these valves do not work properly: ?Blood can flow backward and pool in the lower legs. ?Blood can then leak out of your veins, which can irritate your skin. ?Irritation can cause a break in the skin, which becomes a venous ulcer. ?What increases the risk? ?You are more likely to develop this condition if you: ?Are 25 years of age or older. ?Are male. ?Are overweight. ?Are not active. ?Have had a leg ulcer in the past. ?Have varicose veins. ?Have clots in your lower leg veins (deep vein thrombosis). ?Have inflammation of your leg veins (phlebitis). ?Have recently had a pregnancy. ?Use products that contain nicotine or tobacco. ?What are the signs or symptoms? ?The main symptom of this condition is an open sore near your ankle. Other symptoms may include: ?Swelling. ?Thickening of the skin. ?Fluid leaking from the ulcer. ?Bleeding. ?Itching. ?Pain and swelling that gets worse when you stand up and feels better when you raise your leg. ?Blotchy skin. ?Darkening of the skin. ?How is this diagnosed? ?Your health care provider may suspect a venous ulcer based on your medical history and your risk factors. He or she may: ?Do a physical exam. ?Do other tests, such as: ?Measuring blood pressure in your arms and legs. ?Using sound waves (ultrasound) to measure blood flow in your leg veins. ?How is this  treated? ?This condition may be treated by: ?Keeping your leg raised (elevated). ?Wearing a type of bandage or stocking to compress the veins of your leg (compression therapy). ?Taking medicines to improve blood flow. ?Taking antibiotic medicines to treat infection. ?Cleaning your ulcer and removing any dead tissue from the wound (debridement). ?Placing various types of medicated bandages (dressings) or medicated wraps on your ulcer. ?Surgery to close the wound using a piece of skin taken from another area of your body (graft). This is only done for wounds that are deep or hard to heal. ?You may need to try several different types of treatment to get your venous ulcer to heal. Healing may take a long time. ?Follow these instructions at home: ?Medicines ?Take or apply over-the-counter and prescription medicines only as told by your health care provider. ?If you were prescribed an antibiotic medicine, take it as told by your health care provider. Do not stop using the antibiotic even if you start to feel better. ?Ask your health care provider if you should take aspirin before long trips. ?Wound care ?Follow instructions from your health care provider about how to take care of your wound. Make sure you: ?Wash your hands with soap and water before and after you change your bandage (dressing). If soap and water are not available, use hand sanitizer. ?Change your dressing as told by your health care provider. ?If you had a skin graft, leave stitches (sutures) in place. These may need to stay in place for 2  weeks or longer. ?Ask when you should remove your dressing. If your dressing is dry and sticks to your leg when you try to remove it, moisten or wet the dressing with saline solution or water so that the dressing can be removed without harming your skin or wound tissue. ?When you are able to remove your dressing, check your wound every day for signs of infection. Have a caregiver do this for you if you are not able to  do it yourself. Check for: ?More redness, swelling, or pain. ?More fluid or blood. ?Warmth. ?Pus or a bad smell. ?Activity ?Avoid sitting for a long time without moving. Get up to take short walks every 1-2 hours. This is important to improve blood flow in your legs. Ask for help if you feel weak or unsteady. ?Ask your health care provider what level of activity is safe for you. ?Rest with your legs raised (elevated) during the day. If possible, elevate your legs above the level of your heart for 30 minutes, 3-4 times a day, or as told by your health care provider. ?Do not sit with your legs crossed. ?General instructions ? ?Wear elastic stockings, compression stockings, or support hose as told by your health care provider. ?Raise the foot of your bed as told by your health care provider. ?Do not use any products that contain nicotine or tobacco, such as cigarettes, e-cigarettes, and chewing tobacco. If you need help quitting, ask your health care provider. ?Keep all follow-up visits as told by your health care provider. This is important. ?Contact a health care provider if: ?Your ulcer is getting larger or is not healing. ?Your pain gets worse. ?Get help right away if you have: ?More redness, swelling, or pain around your ulcer. ?More fluid or blood coming from your ulcer. ?Warmth in the area around your ulcer. ?Pus or a bad smell coming from your ulcer. ?A fever. ?Summary ?A venous ulcer is a shallow sore on your lower leg that is caused by poor circulation in your veins. ?Follow instructions from your health care provider about how to take care of your wound. ?Check your wound every day for signs of infection. ?Take over-the-counter and prescription medicines only as told by your health care provider. ?Keep all follow-up visits as told by your health care provider. This is important. ?This information is not intended to replace advice given to you by your health care provider. Make sure you discuss any questions  you have with your health care provider. ?Document Revised: 12/30/2017 Document Reviewed: 12/30/2017 ?Elsevier Patient Education ? [place]. ? ?

## 2021-08-11 NOTE — Progress Notes (Signed)
? ?Subjective:  ? ? Patient ID: [name] [name], male    DOB: April 03, 1951, 71 y.o.   MRN: [id number] ? ?Chief Complaint  ?Patient presents with  ? Hospitalization Follow-up  ? ?Pt presents to the office today for hospital follow up for leg wound. He went to the ED on 07/25/21 and discharged on 07/29/21. He was followed by Cardiologists for CAD and decreased EF. He had a cath 08/08/21 that was showed no blockage.  ? ?He is followed by Vascular surgery and has a bypass scheduled 08/15/21. He has several vascular wounds on bilateral feet. He is taking Amoxicillin 500 mg TID.  ? ?He has a followed up Nephrologists 08/14/21.  ? ?He had Home Health for PT, but states this is on hold.  ? ?States his anxiety has been worsening and causing Insomnia since his hospitalizations.  ?Diabetes ?He presents for his follow-up diabetic visit. He has type 2 diabetes mellitus. There are no hypoglycemic associated symptoms. Associated symptoms include blurred vision and foot paresthesias. Symptoms are stable. Diabetic complications include a CVA, heart disease, nephropathy and peripheral neuropathy. Risk factors for coronary artery disease include dyslipidemia, diabetes mellitus, hypertension, male sex and sedentary lifestyle.  ?Hypertension ?This is a chronic problem. The current episode started more than 1 year ago. The problem has been resolved since onset. The problem is controlled. Associated symptoms include blurred vision, malaise/fatigue and peripheral edema. Pertinent negatives include no shortness of breath. Risk factors for coronary artery disease include dyslipidemia, diabetes mellitus, obesity, male gender and sedentary lifestyle. The current treatment provides moderate improvement. Hypertensive end-organ damage includes CVA.  ? ? ? ?Review of Systems  ?Constitutional:  Positive for malaise/fatigue.  ?Eyes:  Positive for blurred vision.  ?Respiratory:  Negative for shortness of breath.   ?All other systems reviewed and are  negative. ? ?   ?Objective:  ? Physical Exam ?Vitals reviewed.  ?Constitutional:   ?   General: He is not in acute distress. ?   Appearance: He is well-developed. He is obese.  ?HENT:  ?   Head: Normocephalic.  ?   Right Ear: Tympanic membrane normal.  ?   Left Ear: Tympanic membrane normal.  ?Eyes:  ?   General:     ?   Right eye: No discharge.     ?   Left eye: No discharge.  ?   Pupils: Pupils are equal, round, and reactive to light.  ?Neck:  ?   Thyroid: No thyromegaly.  ?Cardiovascular:  ?   Rate and Rhythm: Normal rate and regular rhythm.  ?   Heart sounds: Normal heart sounds. No murmur heard. ?Pulmonary:  ?   Effort: Pulmonary effort is normal. No respiratory distress.  ?   Breath sounds: Normal breath sounds. No wheezing.  ?Abdominal:  ?   General: Bowel sounds are normal. There is no distension.  ?   Palpations: Abdomen is soft.  ?   Tenderness: There is no abdominal tenderness.  ?Musculoskeletal:     ?   General: No tenderness.  ?   Cervical back: Normal range of motion and neck supple.  ?   Right lower leg: Edema (trace) present.  ?   Left lower leg: Edema (trace) present.  ?     Feet: ? ?Skin: ?   Findings: No erythema or rash.  ?Neurological:  ?   Mental Status: He is alert and oriented to person, place, and time.  ?   Cranial Nerves: No cranial nerve deficit.  ?  Motor: Weakness (generalized weakness) present.  ?   Gait: Gait abnormal.  ?   Deep Tendon Reflexes: Reflexes are normal and symmetric.  ?Psychiatric:     ?   Behavior: Behavior normal.     ?   Thought Content: Thought content normal.     ?   Judgment: Judgment normal.  ? ? ? ? ?BP 112/69   Pulse 90   Temp (!) 97 ?F (36.1 ?C) (Temporal)   Ht $R'6\' 3"'Sg$  (1.905 m)   Wt 295 lb (133.8 kg)   BMI 36.87 kg/m?  ? ?   ?Assessment & Plan:  ?[name] [name] comes in today with chief complaint of Hospitalization Follow-up ? ? ?Diagnosis and orders addressed: ? ?1. Coronary artery disease of native artery of native heart with stable angina pectoris ([place]) ?-  CMP14+EGFR ?- CBC with Differential/Platelet ?- Ambulatory referral to Home Health ? ?2. Hypertension associated with diabetes ([place]) ?- CMP14+EGFR ?- CBC with Differential/Platelet ?- Ambulatory referral to Home Health ? ?3. Type 2 diabetes mellitus with diabetic polyneuropathy, with long-term current use of insulin ([place]) ?- CMP14+EGFR ?- CBC with Differential/Platelet ?- Bayer DCA Hb A1c Waived ?- Ambulatory referral to Home Health ? ?4. Hospital discharge follow-up ?- CMP14+EGFR ?- CBC with Differential/Platelet ?- Ambulatory referral to Home Health ? ?5. Vasculitic ulcer of lower extremity ([place]) ?- CMP14+EGFR ?- CBC with Differential/Platelet ?- Ambulatory referral to Home Health ? ?6. Insomnia, unspecified type ?Start trazodone 100 mg  ?Sleep ritual  ?- traZODone (DESYREL) 100 MG tablet; Take 1 tablet (100 mg total) by mouth at bedtime.  Dispense: 90 tablet; Refill: 1 ? ? ?Labs pending ?Health Maintenance reviewed ?Diet and exercise encouraged ? ?Follow up plan: ?3 months  ? ? ?[name], FNP ? ? ? ?

## 2021-08-12 LAB — CBC WITH DIFFERENTIAL/PLATELET
Basophils Absolute: 0.1 10*3/uL (ref 0.0–0.2)
Basos: 1 %
EOS (ABSOLUTE): 0.2 10*3/uL (ref 0.0–0.4)
Eos: 2 %
Hematocrit: 43.1 % (ref 37.5–51.0)
Hemoglobin: 13.7 g/dL (ref 13.0–17.7)
Immature Grans (Abs): 0 10*3/uL (ref 0.0–0.1)
Immature Granulocytes: 0 %
Lymphocytes Absolute: 1.6 10*3/uL (ref 0.7–3.1)
Lymphs: 20 %
MCH: 26.4 pg — ABNORMAL LOW (ref 26.6–33.0)
MCHC: 31.8 g/dL (ref 31.5–35.7)
MCV: 83 fL (ref 79–97)
Monocytes Absolute: 0.6 10*3/uL (ref 0.1–0.9)
Monocytes: 8 %
Neutrophils Absolute: 5.9 10*3/uL (ref 1.4–7.0)
Neutrophils: 69 %
Platelets: 323 10*3/uL (ref 150–450)
RBC: 5.19 x10E6/uL (ref 4.14–5.80)
RDW: 21.4 % — ABNORMAL HIGH (ref 11.6–15.4)
WBC: 8.4 10*3/uL (ref 3.4–10.8)

## 2021-08-12 LAB — CMP14+EGFR
ALT: 15 IU/L (ref 0–44)
AST: 21 IU/L (ref 0–40)
Albumin/Globulin Ratio: 1.5 (ref 1.2–2.2)
Albumin: 4.3 g/dL (ref 3.8–4.8)
Alkaline Phosphatase: 56 IU/L (ref 44–121)
BUN/Creatinine Ratio: 14 (ref 10–24)
BUN: 20 mg/dL (ref 8–27)
Bilirubin Total: 0.5 mg/dL (ref 0.0–1.2)
CO2: 26 mmol/L (ref 20–29)
Calcium: 9.8 mg/dL (ref 8.6–10.2)
Chloride: 95 mmol/L — ABNORMAL LOW (ref 96–106)
Creatinine, Ser: 1.39 mg/dL — ABNORMAL HIGH (ref 0.76–1.27)
Globulin, Total: 2.9 g/dL (ref 1.5–4.5)
Glucose: 219 mg/dL — ABNORMAL HIGH (ref 70–99)
Potassium: 4.5 mmol/L (ref 3.5–5.2)
Sodium: 137 mmol/L (ref 134–144)
Total Protein: 7.2 g/dL (ref 6.0–8.5)
eGFR: 55 mL/min/{1.73_m2} — ABNORMAL LOW (ref >59)

## 2021-08-14 ENCOUNTER — Encounter (HOSPITAL_COMMUNITY): Payer: Self-pay | Admitting: Surgery

## 2021-08-14 ENCOUNTER — Other Ambulatory Visit: Payer: Self-pay

## 2021-08-14 DIAGNOSIS — E1129 Type 2 diabetes mellitus with other diabetic kidney complication: Secondary | ICD-10-CM | POA: Diagnosis not present

## 2021-08-14 DIAGNOSIS — I2510 Atherosclerotic heart disease of native coronary artery without angina pectoris: Secondary | ICD-10-CM | POA: Diagnosis not present

## 2021-08-14 DIAGNOSIS — I739 Peripheral vascular disease, unspecified: Secondary | ICD-10-CM | POA: Diagnosis not present

## 2021-08-14 DIAGNOSIS — R809 Proteinuria, unspecified: Secondary | ICD-10-CM | POA: Diagnosis not present

## 2021-08-14 DIAGNOSIS — I129 Hypertensive chronic kidney disease with stage 1 through stage 4 chronic kidney disease, or unspecified chronic kidney disease: Secondary | ICD-10-CM | POA: Diagnosis not present

## 2021-08-14 DIAGNOSIS — N189 Chronic kidney disease, unspecified: Secondary | ICD-10-CM | POA: Diagnosis not present

## 2021-08-14 DIAGNOSIS — E1122 Type 2 diabetes mellitus with diabetic chronic kidney disease: Secondary | ICD-10-CM | POA: Diagnosis not present

## 2021-08-14 DIAGNOSIS — E559 Vitamin D deficiency, unspecified: Secondary | ICD-10-CM | POA: Diagnosis not present

## 2021-08-14 DIAGNOSIS — I5042 Chronic combined systolic (congestive) and diastolic (congestive) heart failure: Secondary | ICD-10-CM | POA: Diagnosis not present

## 2021-08-14 NOTE — Progress Notes (Addendum)
[name] [name] denies chest pain or shortness of breath.  Patient denies having any s/s of Covid in his household.  Patient denies any known exposure to Covid. ?PCP is Marny Lowenstein, FNP with [facility]. ? ?[name] [name] has type II diabetes, patient said he rarely checks CBG.  Last A1C was 7.5 drawn on 08/11/21.  I instructed [name] [name] to take 42 units of 70/30 Insulin with dinner tonight and no Insulin or medications for diabetes in am. I instructed patient to check CBG after awaking and every 2 hours until arrival  to the hospital. I Instructed patient if CBG is less than 70 to take 4 Glucose Tablets or 1 tube of Glucose Gel or 1/2 cup of a clear juice. Recheck CBG in 15 minutes if CBG is not over 70 call, pre- op desk at [phone] for further instructions. I also instructed patient to calla pre- surgery desk if CBG if >220, the MD might want him to take a partial dose of sliding scale. ? ?[name] reports that he was in Afib this am , heart rate of 90, patient took prn dose of Metoprolol and heart rate returned to normal. Patient said that he was short of breath wen heart rate increased, it probably lasted 30 minutes.. ? ?I instructed [name] [name]  to shower with antibacteria soap.  DO not shave. No nail polish, artificial or acrylic nails. Wear clean clothes, brush your teeth. ?Glasses, contact lens,dentures or partials may not be worn in the OR. If you need to wear them, please bring a case for glasses, do not wear contacts or bring a case, the hospital does not have contact cases, dentures or partials will have to be removed , make sure they are clean, we will provide a denture cup to put them in.  ?

## 2021-08-14 NOTE — Progress Notes (Addendum)
Anesthesia Chart Review: ?Same day workup ? ?71 yo male with history of PAF, HTN, HLD, DM, prior CVA, PAD and CAD.  Cardiac cath in January 2022 with moderate mid LAD stenosis, chronically occluded Diagonal branch, CTO RCA and severe restenosis in the Circumflex and OM stents. 2 new drug eluting stents were placed in the Circumflex/OM. He has atrial fib and is followed in the EP clinic by Dr. Lovena Le. Severe PAD followed by Dr. [name] with left fem pop bypass and right SFA July 2022. He has been on Plavix and Eliquis. He was admitted to [facility] 07/25/21 with lower extremity wounds, fever, rigors and sepsis. No evidence of osteomyelitis. He was treated with IV antibiotics. He was seen by Vascular surgery and lower extremity angiogram showed occluded left femoral-popliteal bypass graft and occluded right tibioperoneal trunk. Planning for redo left fem-pop bypass and PTA of the right lower extremity. Echo 3/14/3 with LVEF=40-45%. Hypokinesis of the posterior wall and basal inferior wall. Mild to moderate MR. He was referred back to cardiology prior to his planned vascular surgery. Dr. [name] felt that he needed to be seen to have a repeat cardiac cath given findings on his echo.  Cath 08/06/2021 showed patent stents in the circumflex/OM, otherwise essentially unchanged disease elsewhere.  Full results below.  Patient was recommended continue medical management of CAD and deemed okay to proceed with planned vascular surgery. ? ?Patient reported LD Plavix and Eliquis 08/10/2021. ? ?CMP and CBC 08/11/2021 reviewed, creatinine mildly elevated 1.39, otherwise unremarkable.  IDDM 2, A1c 7.5. ? ?EKG 07/28/2021: Atrial fibrillation.  Rate 86.  Nonspecific T wave abnormality. ? ?Cath 08/06/2021: ?1. Moderate mid LAD stenosis unchanged. Chronic occlusion Diagonal 1. Severe disease small diagonal 2.  ?2. Patent Circumflex/OM stents. Chronic occlusion of the mid Circumflex beyond the takeoff of the obtuse marginal branch. This branch fills  from left to left collaterals.  ?3. Chronic occlusion mid RCA in old stented segment. The distal vessel fills briskly from left to right collaterals.  ?4. LVEDP=28 mmHg ?  ?Recommendations: Continue medical management of CAD. Ok to proceed with planned vascular surgery. Resume Eliquis tomorrow. I will ask him to begin lasix 40 mg daily ? ?TTE 07/29/2021: ? 1. Left ventricular ejection fraction, by estimation, is 40 to 45%. The  ?left ventricle has mildly decreased function. The left ventricle  ?demonstrates regional wall motion abnormalities (see scoring  ?diagram/findings for description). The left ventricular  ? internal cavity size was mildly dilated. There is mild concentric left  ?ventricular hypertrophy. Left ventricular diastolic function could not be  ?evaluated.  ? 2. Right ventricular systolic function is mildly reduced. The right  ?ventricular size is mildly enlarged. There is normal pulmonary artery  ?systolic pressure.  ? 3. Left atrial size was mildly dilated.  ? 4. Right atrial size was mildly dilated.  ? 5. The pericardial effusion is posterior to the left ventricle.  ? 6. The mitral valve is normal in structure. Mild to moderate mitral valve  ?regurgitation.  ? 7. The aortic valve is tricuspid. There is mild calcification of the  ?aortic valve. There is mild thickening of the aortic valve. Aortic valve  ?regurgitation is not visualized.  ? 8. The inferior vena cava is dilated in size with <50% respiratory  ?variability, suggesting right atrial pressure of 15 mmHg.  ? ?Comparison(s): A prior study was performed on 11/24/2020. Prior images  ?reviewed side by side. Changes from prior study are noted. The left  ?ventricular function is worsened. The left  ventricular wall motion  ?abnormalities are new.  ? ? ? ?Karoline Caldwell, PA-C ?[facility] Short Stay Center/Anesthesiology ?Phone [phone] ?08/14/2021 1:11 PM ? ?

## 2021-08-14 NOTE — Anesthesia Preprocedure Evaluation (Addendum)
Anesthesia Evaluation  ?Patient identified by MRN, date of birth, ID band ?Patient awake ? ? ? ?Reviewed: ?Allergy & Precautions, NPO status , Patient's Chart, lab work & pertinent test results, reviewed documented beta blocker date and time  ? ?History of Anesthesia Complications ?Negative for: history of anesthetic complications ? ?Airway ?Mallampati: II ? ?TM Distance: >3 FB ?Neck ROM: Full ? ? ? Dental ? ?(+) Edentulous Upper, Edentulous Lower ?  ?Pulmonary ?COPD, former smoker,  ?  ?breath sounds clear to auscultation ? ? ? ? ? ? Cardiovascular ?hypertension, Pt. on medications and Pt. on home beta blockers ?(-) angina+ CAD, + Past MI, + Cardiac Stents, + Peripheral Vascular Disease and +CHF  ?+ dysrhythmias Atrial Fibrillation  ?Rhythm:Regular Rate:Normal ? ?08/06/2021 Cath:  ?1. Moderate mid LAD stenosis unchanged. Chronic occlusion Diagonal 1.  ?Severe disease small diagonal 2.  ?2. Patent Circumflex/OM stents. Chronic occlusion of the mid Circumflex  ?beyond the takeoff of the obtuse marginal branch. This branch fills from  ?left to left collaterals.  ?3. Chronic occlusion mid RCA in old stented segment. The distal vessel  ?fills briskly from left to right collaterals.  ?4. LVEDP=28 mmHg ?? ?Recommendations: Continue medical management of CAD. Ok to proceed with  ?planned vascular surgery ? ?07/29/2021 ECHO: EF 40-45%, mildly decreased LVF, mild LV dilation, mild LVH, mildly reduced RVF with mild RV dilation, mild-mod MR, posterior pericardial effusion ?  ?Neuro/Psych ? Headaches, Anxiety Depression   ? GI/Hepatic ?Neg liver ROS, hiatal hernia, GERD  Medicated and Controlled,  ?Endo/Other  ?diabetes (glu 136), Insulin DependentMorbid obesity ? Renal/GU ?Renal InsufficiencyRenal disease  ? ?  ?Musculoskeletal ? ?(+) Arthritis , Fibromyalgia - ? Abdominal ?(+) + obese,   ?Peds ? Hematology ?eliquis   ?Anesthesia Other Findings ? ? Reproductive/Obstetrics ? ?   ? ? ? ? ? ? ? ? ? ? ? ? ? ?  ?  ? ? ? ? ? ? ?Anesthesia Physical ?Anesthesia Plan ? ?ASA: 4 ? ?Anesthesia Plan: General  ? ?Post-op Pain Management: Tylenol PO (pre-op)*  ? ?Induction: Intravenous ? ?PONV Risk Score and Plan: 2 and Ondansetron and Dexamethasone ? ?Airway Management Planned: Oral ETT ? ?Additional Equipment: Arterial line ? ?Intra-op Plan:  ? ?Post-operative Plan: Extubation in OR ? ?Informed Consent: I have reviewed the patients History and Physical, chart, labs and discussed the procedure including the risks, benefits and alternatives for the proposed anesthesia with the patient or authorized representative who has indicated his/her understanding and acceptance.  ? ? ? ? ? ?Plan Discussed with: CRNA and Surgeon ? ?Anesthesia Plan Comments: (PAT note by Karoline Caldwell, PA-C: ? ?71 yo male with history of PAF, HTN, HLD, DM, prior CVA, PAD and CAD.  Cardiac cath in January 2022 with moderate mid LAD stenosis, chronically occluded Diagonal branch, CTO RCA and severe restenosis in the Circumflex and OM stents. 2 new drug eluting stents were placed in the Circumflex/OM. He has atrial fib and is followed in the EP clinic by Dr. Lovena Le. Severe PAD followed by Dr. [name] with left fem pop bypass and right SFA July 2022. He has been on Plavix and Eliquis. He was admitted to [facility] 07/25/21 with lower extremity wounds, fever, rigors and sepsis. No evidence of osteomyelitis. He was treated with IV antibiotics. He was seen by Vascular surgery and lower extremity angiogram showed occluded left femoral-popliteal bypass graft and occluded right tibioperoneal trunk. Planning for redo left fem-pop bypass and PTA of the right lower extremity. Echo 3/14/3 with  LVEF=40-45%. Hypokinesis of the posterior wall and basal inferior wall.?Mild to moderate MR. He was referred back to cardiology prior to his planned vascular surgery. Dr. [name] felt that he needed to be seen to have a repeat cardiac cath given findings on his  echo.? Cath 08/06/2021 showed patent stents in the circumflex/OM, otherwise essentially unchanged disease elsewhere.  Full results below.  Patient was recommended continue medical management of CAD and deemed okay to proceed with planned vascular surgery. ? ?Patient reported LD Plavix and Eliquis 08/10/2021. ? ?CMP and CBC 08/11/2021 reviewed, creatinine mildly elevated 1.39, otherwise unremarkable.  IDDM 2, A1c 7.5. ? ?EKG 07/28/2021: Atrial fibrillation.  Rate 86.  Nonspecific T wave abnormality. ? ?Cath 08/06/2021: ?1. Moderate mid LAD stenosis unchanged. Chronic occlusion Diagonal 1. Severe disease small diagonal 2.  ?2. Patent Circumflex/OM stents. Chronic occlusion of the mid Circumflex beyond the takeoff of the obtuse marginal branch. This branch fills from left to left collaterals.  ?3. Chronic occlusion mid RCA in old stented segment. The distal vessel fills briskly from left to right collaterals.  ?4. LVEDP=28 mmHg ?? ?Recommendations: Continue medical management of CAD. Ok to proceed with planned vascular surgery. Resume Eliquis tomorrow. I will ask him to begin lasix 40 mg daily ? ?TTE 07/29/2021: ??1. Left ventricular ejection fraction, by estimation, is 40 to 45%. The  ?left ventricle has mildly decreased function. The left ventricle  ?demonstrates regional wall motion abnormalities (see scoring  ?diagram/findings for description). The left ventricular  ??internal cavity size was mildly dilated. There is mild concentric left  ?ventricular hypertrophy. Left ventricular diastolic function could not be  ?evaluated.  ??2. Right ventricular systolic function is mildly reduced. The right  ?ventricular size is mildly enlarged. There is normal pulmonary artery  ?systolic pressure.  ??3. Left atrial size was mildly dilated.  ??4. Right atrial size was mildly dilated.  ??5. The pericardial effusion is posterior to the left ventricle.  ??6. The mitral valve is normal in structure. Mild to moderate mitral valve   ?regurgitation.  ??7. The aortic valve is tricuspid. There is mild calcification of the  ?aortic valve. There is mild thickening of the aortic valve. Aortic valve  ?regurgitation is not visualized.  ??8. The inferior vena cava is dilated in size with <50% respiratory  ?variability, suggesting right atrial pressure of 15 mmHg.  ? ?Comparison(s): A prior study was performed on 11/24/2020. Prior images  ?reviewed side by side. Changes from prior study are noted. The left  ?ventricular function is worsened. The left ventricular wall motion  ?abnormalities are new.  ?)  ? ? ? ?Anesthesia Quick Evaluation ? ?

## 2021-08-15 ENCOUNTER — Inpatient Hospital Stay (HOSPITAL_COMMUNITY)
Admission: RE | Admit: 2021-08-15 | Discharge: 2021-08-20 | DRG: 253 | Disposition: A | Discharge status: Home Health | Payer: Medicare Other | Source: Ambulatory Visit | Attending: Surgery | Admitting: Surgery

## 2021-08-15 ENCOUNTER — Inpatient Hospital Stay (HOSPITAL_COMMUNITY): Payer: Medicare Other

## 2021-08-15 ENCOUNTER — Inpatient Hospital Stay (HOSPITAL_COMMUNITY): Payer: Medicare Other | Admitting: Physician Assistant

## 2021-08-15 ENCOUNTER — Encounter (HOSPITAL_COMMUNITY): Payer: Self-pay | Admitting: Surgery

## 2021-08-15 ENCOUNTER — Encounter (HOSPITAL_COMMUNITY)
Admission: RE | Disposition: A | Discharge status: Home Health | Payer: Self-pay | Source: Ambulatory Visit | Attending: Surgery

## 2021-08-15 DIAGNOSIS — Z91048 Other nonmedicinal substance allergy status: Secondary | ICD-10-CM

## 2021-08-15 DIAGNOSIS — Z882 Allergy status to sulfonamides status: Secondary | ICD-10-CM | POA: Diagnosis not present

## 2021-08-15 DIAGNOSIS — I70235 Atherosclerosis of native arteries of right leg with ulceration of other part of foot: Secondary | ICD-10-CM | POA: Diagnosis not present

## 2021-08-15 DIAGNOSIS — I4891 Unspecified atrial fibrillation: Secondary | ICD-10-CM | POA: Diagnosis not present

## 2021-08-15 DIAGNOSIS — I70229 Atherosclerosis of native arteries of extremities with rest pain, unspecified extremity: Secondary | ICD-10-CM | POA: Diagnosis not present

## 2021-08-15 DIAGNOSIS — Z888 Allergy status to other drugs, medicaments and biological substances status: Secondary | ICD-10-CM

## 2021-08-15 DIAGNOSIS — I70249 Atherosclerosis of native arteries of left leg with ulceration of unspecified site: Secondary | ICD-10-CM | POA: Diagnosis not present

## 2021-08-15 DIAGNOSIS — R0602 Shortness of breath: Secondary | ICD-10-CM | POA: Diagnosis not present

## 2021-08-15 DIAGNOSIS — L97529 Non-pressure chronic ulcer of other part of left foot with unspecified severity: Secondary | ICD-10-CM | POA: Diagnosis present

## 2021-08-15 DIAGNOSIS — Z794 Long term (current) use of insulin: Secondary | ICD-10-CM | POA: Diagnosis not present

## 2021-08-15 DIAGNOSIS — A419 Sepsis, unspecified organism: Secondary | ICD-10-CM | POA: Diagnosis not present

## 2021-08-15 DIAGNOSIS — I70221 Atherosclerosis of native arteries of extremities with rest pain, right leg: Principal | ICD-10-CM | POA: Diagnosis present

## 2021-08-15 DIAGNOSIS — G4734 Idiopathic sleep related nonobstructive alveolar hypoventilation: Secondary | ICD-10-CM | POA: Diagnosis not present

## 2021-08-15 DIAGNOSIS — Z91013 Allergy to seafood: Secondary | ICD-10-CM

## 2021-08-15 DIAGNOSIS — R079 Chest pain, unspecified: Secondary | ICD-10-CM | POA: Diagnosis not present

## 2021-08-15 DIAGNOSIS — Z955 Presence of coronary angioplasty implant and graft: Secondary | ICD-10-CM | POA: Diagnosis not present

## 2021-08-15 DIAGNOSIS — Z8673 Personal history of transient ischemic attack (TIA), and cerebral infarction without residual deficits: Secondary | ICD-10-CM

## 2021-08-15 DIAGNOSIS — I70239 Atherosclerosis of native arteries of right leg with ulceration of unspecified site: Secondary | ICD-10-CM | POA: Diagnosis not present

## 2021-08-15 DIAGNOSIS — Y831 Surgical operation with implant of artificial internal device as the cause of abnormal reaction of the patient, or of later complication, without mention of misadventure at the time of the procedure: Secondary | ICD-10-CM | POA: Diagnosis present

## 2021-08-15 DIAGNOSIS — I70209 Unspecified atherosclerosis of native arteries of extremities, unspecified extremity: Principal | ICD-10-CM | POA: Diagnosis present

## 2021-08-15 DIAGNOSIS — E11622 Type 2 diabetes mellitus with other skin ulcer: Secondary | ICD-10-CM | POA: Diagnosis not present

## 2021-08-15 DIAGNOSIS — L97519 Non-pressure chronic ulcer of other part of right foot with unspecified severity: Secondary | ICD-10-CM | POA: Diagnosis present

## 2021-08-15 DIAGNOSIS — I70245 Atherosclerosis of native arteries of left leg with ulceration of other part of foot: Secondary | ICD-10-CM | POA: Diagnosis present

## 2021-08-15 DIAGNOSIS — I7025 Atherosclerosis of native arteries of other extremities with ulceration: Secondary | ICD-10-CM | POA: Diagnosis not present

## 2021-08-15 DIAGNOSIS — L97919 Non-pressure chronic ulcer of unspecified part of right lower leg with unspecified severity: Secondary | ICD-10-CM

## 2021-08-15 DIAGNOSIS — L97929 Non-pressure chronic ulcer of unspecified part of left lower leg with unspecified severity: Secondary | ICD-10-CM | POA: Diagnosis not present

## 2021-08-15 DIAGNOSIS — T82598A Other mechanical complication of other cardiac and vascular devices and implants, initial encounter: Secondary | ICD-10-CM | POA: Diagnosis present

## 2021-08-15 HISTORY — DX: Personal history of other diseases of the digestive system: Z87.19

## 2021-08-15 HISTORY — DX: Pneumonia, unspecified organism: J18.9

## 2021-08-15 HISTORY — PX: ANGIOPLASTY: SHX39

## 2021-08-15 HISTORY — PX: LOWER EXTREMITY ANGIOGRAM: SHX5508

## 2021-08-15 HISTORY — DX: Fibromyalgia: M79.7

## 2021-08-15 HISTORY — PX: FEMORAL-POPLITEAL BYPASS GRAFT: SHX937

## 2021-08-15 LAB — GLUCOSE, CAPILLARY
Glucose-Capillary: 136 mg/dL — ABNORMAL HIGH (ref 70–99)
Glucose-Capillary: 121 mg/dL — ABNORMAL HIGH (ref 70–99)
Glucose-Capillary: 132 mg/dL — ABNORMAL HIGH (ref 70–99)
Glucose-Capillary: 144 mg/dL — ABNORMAL HIGH (ref 70–99)
Glucose-Capillary: 174 mg/dL — ABNORMAL HIGH (ref 70–99)
Glucose-Capillary: 172 mg/dL — ABNORMAL HIGH (ref 70–99)
Glucose-Capillary: 191 mg/dL — ABNORMAL HIGH (ref 70–99)

## 2021-08-15 LAB — URINALYSIS, ROUTINE W REFLEX MICROSCOPIC
Bacteria, UA: NONE SEEN
Bilirubin Urine: NEGATIVE
Glucose, UA: 150 mg/dL — AB
Hgb urine dipstick: NEGATIVE
Ketones, ur: NEGATIVE mg/dL
Leukocytes,Ua: NEGATIVE
Nitrite: NEGATIVE
Protein, ur: 30 mg/dL — AB
Specific Gravity, Urine: 1.02 (ref 1.005–1.030)
pH: 7 (ref 5.0–8.0)

## 2021-08-15 LAB — COMPREHENSIVE METABOLIC PANEL
ALT: 19 U/L (ref 0–44)
AST: 26 U/L (ref 15–41)
Albumin: 3.9 g/dL (ref 3.5–5.0)
Alkaline Phosphatase: 42 U/L (ref 38–126)
Anion gap: 10 (ref 5–15)
BUN: 19 mg/dL (ref 8–23)
CO2: 27 mmol/L (ref 22–32)
Calcium: 9.7 mg/dL (ref 8.9–10.3)
Chloride: 99 mmol/L (ref 98–111)
Creatinine, Ser: 1.47 mg/dL — ABNORMAL HIGH (ref 0.61–1.24)
GFR, Estimated: 51 mL/min — ABNORMAL LOW (ref >60)
Glucose, Bld: 138 mg/dL — ABNORMAL HIGH (ref 70–99)
Potassium: 4.1 mmol/L (ref 3.5–5.1)
Sodium: 136 mmol/L (ref 135–145)
Total Bilirubin: 0.7 mg/dL (ref 0.3–1.2)
Total Protein: 7.7 g/dL (ref 6.5–8.1)

## 2021-08-15 LAB — CBC
HCT: 41.1 % (ref 39.0–52.0)
Hemoglobin: 13.1 g/dL (ref 13.0–17.0)
MCH: 27.1 pg (ref 26.0–34.0)
MCHC: 31.9 g/dL (ref 30.0–36.0)
MCV: 85.1 fL (ref 80.0–100.0)
Platelets: 269 10*3/uL (ref 150–400)
RBC: 4.83 MIL/uL (ref 4.22–5.81)
RDW: 20.7 % — ABNORMAL HIGH (ref 11.5–15.5)
WBC: 10 10*3/uL (ref 4.0–10.5)
nRBC: 0 % (ref 0.0–0.2)

## 2021-08-15 LAB — TYPE AND SCREEN
ABO/RH(D): A POS
Antibody Screen: NEGATIVE

## 2021-08-15 LAB — PROTIME-INR
INR: 1.1 (ref 0.8–1.2)
Prothrombin Time: 14 seconds (ref 11.4–15.2)

## 2021-08-15 LAB — APTT: aPTT: 37 seconds — ABNORMAL HIGH (ref 24–36)

## 2021-08-15 SURGERY — BYPASS GRAFT FEMORAL-POPLITEAL ARTERY
Anesthesia: General | Site: Leg Lower | Laterality: Right

## 2021-08-15 MED ORDER — HEPARIN 6000 UNIT IRRIGATION SOLUTION
Status: DC | PRN
  Administered 2021-08-15: 1

## 2021-08-15 MED ORDER — LACTATED RINGERS IV SOLN: INTRAVENOUS | Status: DC | PRN

## 2021-08-15 MED ORDER — PROTAMINE SULFATE 10 MG/ML IV SOLN
INTRAVENOUS | Status: AC
  Filled 2021-08-15: qty 5

## 2021-08-15 MED ORDER — HEPARIN SODIUM (PORCINE) 1000 UNIT/ML IJ SOLN: INTRAMUSCULAR | Status: DC | PRN

## 2021-08-15 MED ORDER — FENTANYL CITRATE (PF) 250 MCG/5ML IJ SOLN
INTRAMUSCULAR | Status: AC
  Filled 2021-08-15: qty 5

## 2021-08-15 MED ORDER — DEXAMETHASONE SODIUM PHOSPHATE 10 MG/ML IJ SOLN
INTRAMUSCULAR | Status: AC
  Filled 2021-08-15: qty 1

## 2021-08-15 MED ORDER — CHLORHEXIDINE GLUCONATE CLOTH 2 % EX PADS: 6.0000 | MEDICATED_PAD | Freq: Once | CUTANEOUS | Status: DC

## 2021-08-15 MED ORDER — INSULIN ASPART 100 UNIT/ML IJ SOLN: 0.0000 [IU] | INTRAMUSCULAR | Status: DC | PRN

## 2021-08-15 MED ORDER — 0.9 % SODIUM CHLORIDE (POUR BTL) OPTIME
TOPICAL | Status: DC | PRN
  Administered 2021-08-15: 2000 mL

## 2021-08-15 MED ORDER — PHENYLEPHRINE 40 MCG/ML (10ML) SYRINGE FOR IV PUSH (FOR BLOOD PRESSURE SUPPORT)
PREFILLED_SYRINGE | INTRAVENOUS | Status: DC | PRN
  Administered 2021-08-15: 160 ug via INTRAVENOUS

## 2021-08-15 MED ORDER — HEPARIN 6000 UNIT IRRIGATION SOLUTION
Status: AC
  Filled 2021-08-15: qty 500

## 2021-08-15 MED ORDER — PHENYLEPHRINE HCL-NACL 20-0.9 MG/250ML-% IV SOLN
INTRAVENOUS | Status: DC | PRN
  Administered 2021-08-15: 20 ug/min via INTRAVENOUS

## 2021-08-15 MED ORDER — HEPARIN SODIUM (PORCINE) 1000 UNIT/ML IJ SOLN
INTRAMUSCULAR | Status: AC
  Filled 2021-08-15: qty 10

## 2021-08-15 MED ORDER — PHENYLEPHRINE 40 MCG/ML (10ML) SYRINGE FOR IV PUSH (FOR BLOOD PRESSURE SUPPORT)
PREFILLED_SYRINGE | INTRAVENOUS | Status: AC
  Filled 2021-08-15: qty 30

## 2021-08-15 MED ORDER — ONDANSETRON HCL 4 MG/2ML IJ SOLN
INTRAMUSCULAR | Status: AC
  Filled 2021-08-15: qty 2

## 2021-08-15 MED ORDER — ACETAMINOPHEN 500 MG PO TABS
1000.0000 mg | ORAL_TABLET | Freq: Once | ORAL | Status: AC
  Administered 2021-08-15: 1000 mg via ORAL
  Filled 2021-08-15: qty 2

## 2021-08-15 MED ORDER — PROTAMINE SULFATE 10 MG/ML IV SOLN
INTRAVENOUS | Status: DC | PRN
  Administered 2021-08-15: 50 mg via INTRAVENOUS

## 2021-08-15 MED ORDER — HYDROMORPHONE HCL 1 MG/ML IJ SOLN
0.2500 mg | INTRAMUSCULAR | Status: DC | PRN
  Administered 2021-08-16 (×3): 0.5 mg via INTRAVENOUS

## 2021-08-15 MED ORDER — ROCURONIUM BROMIDE 10 MG/ML (PF) SYRINGE
PREFILLED_SYRINGE | INTRAVENOUS | Status: DC | PRN
  Administered 2021-08-15: 20 mg via INTRAVENOUS
  Administered 2021-08-15: 60 mg via INTRAVENOUS
  Administered 2021-08-15: 40 mg via INTRAVENOUS

## 2021-08-15 MED ORDER — EPHEDRINE 5 MG/ML INJ
INTRAVENOUS | Status: AC
  Filled 2021-08-15: qty 5

## 2021-08-15 MED ORDER — CEFAZOLIN IN SODIUM CHLORIDE 3-0.9 GM/100ML-% IV SOLN
3.0000 g | INTRAVENOUS | Status: AC
  Administered 2021-08-15: 3 g via INTRAVENOUS
  Administered 2021-08-15: 2 g via INTRAVENOUS
  Filled 2021-08-15: qty 100

## 2021-08-15 MED ORDER — SODIUM CHLORIDE (PF) 0.9 % IJ SOLN
INTRAVENOUS | Status: DC | PRN
  Administered 2021-08-15: 45 mL via INTRAMUSCULAR

## 2021-08-15 MED ORDER — ONDANSETRON HCL 4 MG/2ML IJ SOLN
INTRAMUSCULAR | Status: DC | PRN
  Administered 2021-08-15: 4 mg via INTRAVENOUS

## 2021-08-15 MED ORDER — MEPERIDINE HCL 25 MG/ML IJ SOLN: 6.2500 mg | INTRAMUSCULAR | Status: DC | PRN

## 2021-08-15 MED ORDER — CHLORHEXIDINE GLUCONATE 0.12 % MT SOLN
15.0000 mL | OROMUCOSAL | Status: AC
  Administered 2021-08-15: 15 mL via OROMUCOSAL
  Filled 2021-08-15 (×2): qty 15

## 2021-08-15 MED ORDER — LIDOCAINE 2% (20 MG/ML) 5 ML SYRINGE
INTRAMUSCULAR | Status: AC
  Filled 2021-08-15: qty 5

## 2021-08-15 MED ORDER — HEPARIN SODIUM (PORCINE) 1000 UNIT/ML IJ SOLN
INTRAMUSCULAR | Status: DC | PRN
  Administered 2021-08-15: 5000 [IU] via INTRAVENOUS
  Administered 2021-08-15: 3000 [IU] via INTRAVENOUS
  Administered 2021-08-15: 2000 [IU] via INTRAVENOUS
  Administered 2021-08-15: 11000 [IU] via INTRAVENOUS

## 2021-08-15 MED ORDER — SODIUM CHLORIDE 0.9 % IV SOLN: INTRAVENOUS | Status: DC

## 2021-08-15 MED ORDER — LACTATED RINGERS IV SOLN
INTRAVENOUS | Status: DC
Start: 2021-08-15 — End: 2021-08-16

## 2021-08-15 MED ORDER — CEFAZOLIN SODIUM 1 G IJ SOLR
INTRAMUSCULAR | Status: AC
  Filled 2021-08-15: qty 20

## 2021-08-15 MED ORDER — MIDAZOLAM HCL 2 MG/2ML IJ SOLN: 0.5000 mg | Freq: Once | INTRAMUSCULAR | Status: DC | PRN

## 2021-08-15 MED ORDER — PROPOFOL 10 MG/ML IV BOLUS
INTRAVENOUS | Status: DC | PRN
  Administered 2021-08-15: 120 mg via INTRAVENOUS

## 2021-08-15 MED ORDER — ROCURONIUM BROMIDE 10 MG/ML (PF) SYRINGE
PREFILLED_SYRINGE | INTRAVENOUS | Status: AC
  Filled 2021-08-15: qty 10

## 2021-08-15 MED ORDER — ALBUMIN HUMAN 5 % IV SOLN: INTRAVENOUS | Status: DC | PRN

## 2021-08-15 MED ORDER — PROPOFOL 10 MG/ML IV BOLUS
INTRAVENOUS | Status: AC
  Filled 2021-08-15: qty 20

## 2021-08-15 MED ORDER — ROCURONIUM BROMIDE 10 MG/ML (PF) SYRINGE
PREFILLED_SYRINGE | INTRAVENOUS | Status: AC
  Filled 2021-08-15: qty 20

## 2021-08-15 MED ORDER — OXYCODONE HCL 5 MG/5ML PO SOLN: 5.0000 mg | Freq: Once | ORAL | Status: DC | PRN

## 2021-08-15 MED ORDER — OXYCODONE HCL 5 MG PO TABS: 5.0000 mg | ORAL_TABLET | Freq: Once | ORAL | Status: DC | PRN

## 2021-08-15 MED ORDER — FENTANYL CITRATE (PF) 250 MCG/5ML IJ SOLN
INTRAMUSCULAR | Status: DC | PRN
  Administered 2021-08-15: 50 ug via INTRAVENOUS
  Administered 2021-08-15 (×2): 25 ug via INTRAVENOUS
  Administered 2021-08-15: 50 ug via INTRAVENOUS

## 2021-08-15 MED ORDER — LIDOCAINE 2% (20 MG/ML) 5 ML SYRINGE
INTRAMUSCULAR | Status: DC | PRN
  Administered 2021-08-15: 60 mg via INTRAVENOUS

## 2021-08-15 MED ORDER — EPHEDRINE SULFATE-NACL 50-0.9 MG/10ML-% IV SOSY
PREFILLED_SYRINGE | INTRAVENOUS | Status: DC | PRN
  Administered 2021-08-15 (×4): 5 mg via INTRAVENOUS

## 2021-08-15 MED ORDER — HEPARIN SODIUM (PORCINE) 1000 UNIT/ML IJ SOLN
INTRAMUSCULAR | Status: AC
  Filled 2021-08-15: qty 40

## 2021-08-15 SURGICAL SUPPLY — 93 items
ADH SKN CLS APL DERMABOND .7 (GAUZE/BANDAGES/DRESSINGS) ×3
ADH SKN CLS LQ APL DERMABOND (GAUZE/BANDAGES/DRESSINGS) ×3
AGENT HMST KT MTR STRL THRMB (HEMOSTASIS)
BAG BANDED W/RUBBER/TAPE 36X54 (MISCELLANEOUS) ×1 IMPLANT
BAG COUNTER SPONGE SURGICOUNT (BAG) ×5 IMPLANT
BAG EQP BAND 135X91 W/RBR TAPE (MISCELLANEOUS) ×3
BAG SPNG CNTER NS LX DISP (BAG) ×3
BALLN STERLING OTW 3X100X150 (BALLOONS) ×4
BALLN STERLING RX 3X40X135 (BALLOONS) ×4
BALLOON STERLING OTW 3X100X150 (BALLOONS) IMPLANT
BALLOON STERLING RX 3X40X135 (BALLOONS) IMPLANT
BANDAGE ESMARK 6X9 LF (GAUZE/BANDAGES/DRESSINGS) IMPLANT
BNDG CMPR 9X6 STRL LF SNTH (GAUZE/BANDAGES/DRESSINGS) ×3
BNDG ESMARK 6X9 LF (GAUZE/BANDAGES/DRESSINGS) ×4
CANISTER SUCT 3000ML PPV (MISCELLANEOUS) ×5 IMPLANT
CANNULA VESSEL 3MM 2 BLNT TIP (CANNULA) ×5 IMPLANT
CATH CXI SUPP 2.6F 150 ANG (CATHETERS) ×1 IMPLANT
CATH OMNI FLUSH 5F 65CM (CATHETERS) ×1 IMPLANT
CATH SOFT-VU ST 4F 90CM (CATHETERS) ×1 IMPLANT
CLIP VESOCCLUDE MED 24/CT (CLIP) ×5 IMPLANT
CLIP VESOCCLUDE SM WIDE 24/CT (CLIP) ×5 IMPLANT
COVER DOME SNAP 22 D (MISCELLANEOUS) ×1 IMPLANT
CUFF TOURN SGL QUICK 24 (TOURNIQUET CUFF)
CUFF TOURN SGL QUICK 34 (TOURNIQUET CUFF) ×4
CUFF TOURN SGL QUICK 42 (TOURNIQUET CUFF) IMPLANT
CUFF TRNQT CYL 24X4X16.5-23 (TOURNIQUET CUFF) IMPLANT
CUFF TRNQT CYL 34X4.125X (TOURNIQUET CUFF) IMPLANT
DERMABOND ADHESIVE PROPEN (GAUZE/BANDAGES/DRESSINGS) ×1
DERMABOND ADVANCED (GAUZE/BANDAGES/DRESSINGS) ×1
DERMABOND ADVANCED .7 DNX12 (GAUZE/BANDAGES/DRESSINGS) ×4 IMPLANT
DERMABOND ADVANCED .7 DNX6 (GAUZE/BANDAGES/DRESSINGS) IMPLANT
DEVICE TORQUE H2O (MISCELLANEOUS) ×1 IMPLANT
DRAIN CHANNEL 15F RND FF W/TCR (WOUND CARE) IMPLANT
DRAPE HALF SHEET 40X57 (DRAPES) IMPLANT
DRAPE X-RAY CASS 24X20 (DRAPES) IMPLANT
DRSG TEGADERM 2-3/8X2-3/4 SM (GAUZE/BANDAGES/DRESSINGS) ×1 IMPLANT
DRSG TEGADERM 4X4.75 (GAUZE/BANDAGES/DRESSINGS) ×1 IMPLANT
ELECT REM PT RETURN 9FT ADLT (ELECTROSURGICAL) ×4
ELECTRODE REM PT RTRN 9FT ADLT (ELECTROSURGICAL) ×4 IMPLANT
EVACUATOR SILICONE 100CC (DRAIN) IMPLANT
GAUZE 4X4 16PLY ~~LOC~~+RFID DBL (SPONGE) ×2 IMPLANT
GLOVE SRG 8 PF TXTR STRL LF DI (GLOVE) ×4 IMPLANT
GLOVE SURG MICRO LTX SZ7.5 (GLOVE) ×4 IMPLANT
GLOVE SURG POLYISO LF SZ7.5 (GLOVE) ×8 IMPLANT
GLOVE SURG UNDER POLY LF SZ8 (GLOVE) ×16
GOWN STRL REUS W/ TWL LRG LVL3 (GOWN DISPOSABLE) ×8 IMPLANT
GOWN STRL REUS W/ TWL XL LVL3 (GOWN DISPOSABLE) ×4 IMPLANT
GOWN STRL REUS W/TWL LRG LVL3 (GOWN DISPOSABLE) ×32
GOWN STRL REUS W/TWL XL LVL3 (GOWN DISPOSABLE) ×4
GRAFT PROPATEN W/RING 6X80X60 (Vascular Products) ×1 IMPLANT
GUIDEWIRE ANGLED .035X260CM (WIRE) ×1 IMPLANT
HEMOSTAT SNOW SURGICEL 2X4 (HEMOSTASIS) IMPLANT
INSERT FOGARTY SM (MISCELLANEOUS) IMPLANT
KIT BASIN OR (CUSTOM PROCEDURE TRAY) ×5 IMPLANT
KIT DRSG PREVENA PLUS 7DAY 125 (MISCELLANEOUS) ×1 IMPLANT
KIT ENCORE 26 ADVANTAGE (KITS) ×1 IMPLANT
KIT MICROPUNCTURE NIT STIFF (SHEATH) ×1 IMPLANT
KIT PREVENA INCISION MGT 13 (CANNISTER) ×1 IMPLANT
KIT TURNOVER KIT B (KITS) ×5 IMPLANT
MARKER GRAFT CORONARY BYPASS (MISCELLANEOUS) IMPLANT
NS IRRIG 1000ML POUR BTL (IV SOLUTION) ×10 IMPLANT
PACK PERIPHERAL VASCULAR (CUSTOM PROCEDURE TRAY) ×5 IMPLANT
PAD ARMBOARD 7.5X6 YLW CONV (MISCELLANEOUS) ×10 IMPLANT
SET COLLECT BLD 21X3/4 12 (NEEDLE) IMPLANT
SET MICROPUNCTURE 5F STIFF (MISCELLANEOUS) ×1 IMPLANT
SHEATH PINNACLE 5F 10CM (SHEATH) ×1 IMPLANT
SHEATH PINNACLE ST 6F 45CM (SHEATH) ×1 IMPLANT
SHEATH PINNACLE ST 6F 65CM (SHEATH) ×1 IMPLANT
STOPCOCK 4 WAY LG BORE MALE ST (IV SETS) ×1 IMPLANT
SURGIFLO W/THROMBIN 8M KIT (HEMOSTASIS) IMPLANT
SUT ETHILON 3 0 PS 1 (SUTURE) IMPLANT
SUT GORETEX 6.0 TT13 (SUTURE) IMPLANT
SUT GORETEX 6.0 TT9 (SUTURE) IMPLANT
SUT PROLENE 5 0 C 1 24 (SUTURE) ×6 IMPLANT
SUT PROLENE 6 0 BV (SUTURE) ×12 IMPLANT
SUT PROLENE 7 0 BV 1 (SUTURE) IMPLANT
SUT SILK 2 0 SH (SUTURE) ×5 IMPLANT
SUT SILK 3 0 (SUTURE)
SUT SILK 3-0 18XBRD TIE 12 (SUTURE) IMPLANT
SUT VIC AB 2-0 CT1 27 (SUTURE) ×8
SUT VIC AB 2-0 CT1 TAPERPNT 27 (SUTURE) ×8 IMPLANT
SUT VIC AB 3-0 SH 27 (SUTURE) ×8
SUT VIC AB 3-0 SH 27X BRD (SUTURE) ×8 IMPLANT
SUT VIC AB 3-0 X1 27 (SUTURE) ×2 IMPLANT
SUT VICRYL 4-0 PS2 18IN ABS (SUTURE) ×8 IMPLANT
SYR 10ML LL (SYRINGE) ×3 IMPLANT
TOWEL GREEN STERILE (TOWEL DISPOSABLE) ×5 IMPLANT
TRAY FOLEY MTR SLVR 16FR STAT (SET/KITS/TRAYS/PACK) ×5 IMPLANT
TUBING EXTENTION W/L.L. (IV SETS) ×1 IMPLANT
UNDERPAD 30X36 HEAVY ABSORB (UNDERPADS AND DIAPERS) ×5 IMPLANT
WATER STERILE IRR 1000ML POUR (IV SOLUTION) ×5 IMPLANT
WIRE G V18X300CM (WIRE) ×2 IMPLANT
WIRE STARTER BENTSON 035X150 (WIRE) ×1 IMPLANT

## 2021-08-15 NOTE — Interval H&P Note (Signed)
History and Physical Interval Note: ? ?08/15/2021 ?4:50 PM ? ?[name]  has presented today for surgery, with the diagnosis of Atherosclerosis of native arteries of the extremities with ulceration.  The various methods of treatment have been discussed with the patient and family. After consideration of risks, benefits and other options for treatment, the patient has consented to  Procedure(s): ?REDO LEFT FEMORAL-POPLITEAL BYPASS (Right) ?RIGHT POPLITEAL ANGIOPLASTY AND STENTING (Right) as a surgical intervention.  The patient's history has been reviewed, patient examined, no change in status, stable for surgery.  I have reviewed the patient's chart and labs.  Questions were answered to the patient's satisfaction.   ? ? ?[name] [name] ? ? ?

## 2021-08-15 NOTE — Anesthesia Procedure Notes (Signed)
Procedure Name: Intubation ?Date/Time: 08/15/2021 6:53 PM ?Performed by: Thelma Comp, CRNA ?Pre-anesthesia Checklist: Patient identified, Emergency Drugs available, Suction available and Patient being monitored ?Patient Re-evaluated:Patient Re-evaluated prior to induction ?Oxygen Delivery Method: Circle System Utilized ?Preoxygenation: Pre-oxygenation with 100% oxygen ?Induction Type: IV induction ?Ventilation: Mask ventilation without difficulty and Oral airway inserted - appropriate to patient size ?Laryngoscope Size: Sabra Heck and 3 ?Grade View: Grade I ?Tube type: Oral ?Tube size: 7.5 mm ?Number of attempts: 1 ?Airway Equipment and Method: Stylet and Oral airway ?Placement Confirmation: ETT inserted through vocal cords under direct vision, positive ETCO2 and breath sounds checked- equal and bilateral ?Secured at: 22 cm ?Tube secured with: Tape ?Dental Injury: Teeth and Oropharynx as per pre-operative assessment  ? ? ? ? ?

## 2021-08-15 NOTE — Anesthesia Procedure Notes (Signed)
Arterial Line Insertion ?Start/End3/31/2023 10:15 AM, 08/15/2021 10:25 AM ?Performed by: Kyung Rudd, CRNA, CRNA ? Preanesthetic checklist: patient identified, IV checked, site marked, risks and benefits discussed, surgical consent, monitors and equipment checked, pre-op evaluation and timeout performed ?Lidocaine 1% used for infiltration ?Right, radial was placed ?Catheter size: 20 G ?Hand hygiene performed , maximum sterile barriers used  and Seldinger technique used ?Allen's test indicative of satisfactory collateral circulation ?Attempts: 1 ?Procedure performed without using ultrasound guided technique. ?Following insertion, Biopatch and dressing applied. ?Post procedure assessment: normal ? ?Patient tolerated the procedure well with no immediate complications. ? ? ?

## 2021-08-15 NOTE — Progress Notes (Signed)
Anesthesia made aware of CBG. Did not want to start preop hyperglycemia protocol at this time since patient took 25 units Novolog at home prior to arrival. Orders received to spot check cbg in thirty minutes.  ?

## 2021-08-16 ENCOUNTER — Inpatient Hospital Stay (HOSPITAL_COMMUNITY): Payer: Medicare Other

## 2021-08-16 ENCOUNTER — Other Ambulatory Visit: Payer: Self-pay

## 2021-08-16 DIAGNOSIS — I70229 Atherosclerosis of native arteries of extremities with rest pain, unspecified extremity: Secondary | ICD-10-CM | POA: Diagnosis present

## 2021-08-16 LAB — SURGICAL PCR SCREEN
MRSA, PCR: NEGATIVE
Staphylococcus aureus: NEGATIVE

## 2021-08-16 LAB — CBC
HCT: 34.6 % — ABNORMAL LOW (ref 39.0–52.0)
Hemoglobin: 10.9 g/dL — ABNORMAL LOW (ref 13.0–17.0)
MCH: 27 pg (ref 26.0–34.0)
MCHC: 31.5 g/dL (ref 30.0–36.0)
MCV: 85.6 fL (ref 80.0–100.0)
Platelets: 225 10*3/uL (ref 150–400)
RBC: 4.04 MIL/uL — ABNORMAL LOW (ref 4.22–5.81)
RDW: 20.9 % — ABNORMAL HIGH (ref 11.5–15.5)
WBC: 10.4 10*3/uL (ref 4.0–10.5)
nRBC: 0 % (ref 0.0–0.2)

## 2021-08-16 LAB — POCT I-STAT 7, (LYTES, BLD GAS, ICA,H+H)
Acid-Base Excess: 1 mmol/L (ref 0.0–2.0)
Bicarbonate: 26.3 mmol/L (ref 20.0–28.0)
Calcium, Ion: 1.25 mmol/L (ref 1.15–1.40)
HCT: 36 % — ABNORMAL LOW (ref 39.0–52.0)
Hemoglobin: 12.2 g/dL — ABNORMAL LOW (ref 13.0–17.0)
O2 Saturation: 98 %
Patient temperature: 36.7
Potassium: 4 mmol/L (ref 3.5–5.1)
Sodium: 137 mmol/L (ref 135–145)
TCO2: 28 mmol/L (ref 22–32)
pCO2 arterial: 45 mmHg (ref 32–48)
pH, Arterial: 7.374 (ref 7.35–7.45)
pO2, Arterial: 105 mmHg (ref 83–108)

## 2021-08-16 LAB — URINALYSIS, COMPLETE (UACMP) WITH MICROSCOPIC
Bacteria, UA: NONE SEEN
Bilirubin Urine: NEGATIVE
Glucose, UA: >=500 mg/dL — AB
Hgb urine dipstick: NEGATIVE
Ketones, ur: 5 mg/dL — AB
Leukocytes,Ua: NEGATIVE
Nitrite: NEGATIVE
Protein, ur: NEGATIVE mg/dL
Specific Gravity, Urine: 1.014 (ref 1.005–1.030)
pH: 6 (ref 5.0–8.0)

## 2021-08-16 LAB — GLUCOSE, CAPILLARY
Glucose-Capillary: 181 mg/dL — ABNORMAL HIGH (ref 70–99)
Glucose-Capillary: 183 mg/dL — ABNORMAL HIGH (ref 70–99)
Glucose-Capillary: 189 mg/dL — ABNORMAL HIGH (ref 70–99)
Glucose-Capillary: 189 mg/dL — ABNORMAL HIGH (ref 70–99)
Glucose-Capillary: 204 mg/dL — ABNORMAL HIGH (ref 70–99)
Glucose-Capillary: 209 mg/dL — ABNORMAL HIGH (ref 70–99)

## 2021-08-16 LAB — POCT ACTIVATED CLOTTING TIME
Activated Clotting Time: 233 seconds
Activated Clotting Time: 191 seconds
Activated Clotting Time: 191 seconds
Activated Clotting Time: 179 seconds

## 2021-08-16 LAB — LIPID PANEL
Cholesterol: 144 mg/dL (ref 0–200)
HDL: 23 mg/dL — ABNORMAL LOW (ref >40)
LDL Cholesterol: 85 mg/dL (ref 0–99)
Total CHOL/HDL Ratio: 6.3 RATIO
Triglycerides: 181 mg/dL — ABNORMAL HIGH (ref <150)
VLDL: 36 mg/dL (ref 0–40)

## 2021-08-16 LAB — CREATININE, SERUM
Creatinine, Ser: 1.36 mg/dL — ABNORMAL HIGH (ref 0.61–1.24)
GFR, Estimated: 56 mL/min — ABNORMAL LOW (ref >60)

## 2021-08-16 MED ORDER — SUGAMMADEX SODIUM 200 MG/2ML IV SOLN
INTRAVENOUS | Status: DC | PRN
  Administered 2021-08-16: 200 mg via INTRAVENOUS

## 2021-08-16 MED ORDER — MORPHINE SULFATE (PF) 2 MG/ML IV SOLN
2.0000 mg | INTRAVENOUS | Status: DC | PRN
  Administered 2021-08-16 – 2021-08-17 (×7): 2 mg via INTRAVENOUS
  Filled 2021-08-16 (×8): qty 1

## 2021-08-16 MED ORDER — ACETAMINOPHEN 325 MG PO TABS: 325.0000 mg | ORAL_TABLET | ORAL | Status: DC | PRN

## 2021-08-16 MED ORDER — HYDROMORPHONE HCL 1 MG/ML IJ SOLN
INTRAMUSCULAR | Status: AC
  Filled 2021-08-16: qty 1

## 2021-08-16 MED ORDER — DOCUSATE SODIUM 100 MG PO CAPS
100.0000 mg | ORAL_CAPSULE | Freq: Every day | ORAL | Status: DC
  Administered 2021-08-18 – 2021-08-20 (×3): 100 mg via ORAL
  Filled 2021-08-16 (×4): qty 1

## 2021-08-16 MED ORDER — INSULIN ASPART 100 UNIT/ML IJ SOLN
0.0000 [IU] | Freq: Three times a day (TID) | INTRAMUSCULAR | Status: DC
  Administered 2021-08-16 (×2): 3 [IU] via SUBCUTANEOUS
  Administered 2021-08-16 – 2021-08-19 (×8): 5 [IU] via SUBCUTANEOUS
  Administered 2021-08-19: 8 [IU] via SUBCUTANEOUS
  Administered 2021-08-19 – 2021-08-20 (×2): 5 [IU] via SUBCUTANEOUS

## 2021-08-16 MED ORDER — ACETAMINOPHEN 650 MG RE SUPP: 325.0000 mg | RECTAL | Status: DC | PRN

## 2021-08-16 MED ORDER — CEFAZOLIN SODIUM-DEXTROSE 2-4 GM/100ML-% IV SOLN
2.0000 g | Freq: Three times a day (TID) | INTRAVENOUS | Status: AC
  Administered 2021-08-16 (×2): 2 g via INTRAVENOUS
  Filled 2021-08-16 (×2): qty 100

## 2021-08-16 MED ORDER — SODIUM CHLORIDE 0.9 % IV SOLN: INTRAVENOUS | Status: AC

## 2021-08-16 MED ORDER — PANTOPRAZOLE SODIUM 40 MG PO TBEC
40.0000 mg | DELAYED_RELEASE_TABLET | Freq: Every day | ORAL | Status: DC
  Administered 2021-08-16 – 2021-08-20 (×5): 40 mg via ORAL
  Filled 2021-08-16 (×5): qty 1

## 2021-08-16 MED ORDER — MAGNESIUM SULFATE 2 GM/50ML IV SOLN: 2.0000 g | Freq: Every day | INTRAVENOUS | Status: DC | PRN

## 2021-08-16 MED ORDER — METOPROLOL TARTRATE 5 MG/5ML IV SOLN: 2.0000 mg | INTRAVENOUS | Status: DC | PRN

## 2021-08-16 MED ORDER — SODIUM CHLORIDE 0.9 % IV SOLN: 500.0000 mL | Freq: Once | INTRAVENOUS | Status: DC | PRN

## 2021-08-16 MED ORDER — ALBUTEROL SULFATE (2.5 MG/3ML) 0.083% IN NEBU: 2.5000 mg | INHALATION_SOLUTION | Freq: Four times a day (QID) | RESPIRATORY_TRACT | Status: DC | PRN

## 2021-08-16 MED ORDER — ISOSORBIDE MONONITRATE ER 30 MG PO TB24
30.0000 mg | ORAL_TABLET | Freq: Every day | ORAL | Status: DC
  Administered 2021-08-16 – 2021-08-20 (×5): 30 mg via ORAL
  Filled 2021-08-16 (×5): qty 1

## 2021-08-16 MED ORDER — PHENOL 1.4 % MT LIQD: 1.0000 | OROMUCOSAL | Status: DC | PRN

## 2021-08-16 MED ORDER — ALUM & MAG HYDROXIDE-SIMETH 200-200-20 MG/5ML PO SUSP
15.0000 mL | ORAL | Status: DC | PRN
  Administered 2021-08-20: 30 mL via ORAL
  Filled 2021-08-16: qty 30

## 2021-08-16 MED ORDER — EZETIMIBE 10 MG PO TABS
10.0000 mg | ORAL_TABLET | Freq: Every day | ORAL | Status: DC
  Administered 2021-08-16 – 2021-08-20 (×5): 10 mg via ORAL
  Filled 2021-08-16 (×5): qty 1

## 2021-08-16 MED ORDER — POTASSIUM CHLORIDE CRYS ER 20 MEQ PO TBCR: 20.0000 meq | EXTENDED_RELEASE_TABLET | Freq: Every day | ORAL | Status: DC | PRN

## 2021-08-16 MED ORDER — HYDROCODONE-ACETAMINOPHEN 10-325 MG PO TABS
1.0000 | ORAL_TABLET | ORAL | Status: DC | PRN
Start: 2021-08-16 — End: 2021-08-20
  Administered 2021-08-16 – 2021-08-20 (×13): 1 via ORAL
  Filled 2021-08-16 (×14): qty 1

## 2021-08-16 MED ORDER — METOPROLOL SUCCINATE ER 50 MG PO TB24
50.0000 mg | ORAL_TABLET | Freq: Every day | ORAL | Status: DC
  Administered 2021-08-16 – 2021-08-20 (×5): 50 mg via ORAL
  Filled 2021-08-16 (×5): qty 1

## 2021-08-16 MED ORDER — ORAL CARE MOUTH RINSE
15.0000 mL | Freq: Two times a day (BID) | OROMUCOSAL | Status: DC
  Administered 2021-08-16 – 2021-08-20 (×3): 15 mL via OROMUCOSAL

## 2021-08-16 MED ORDER — ONDANSETRON HCL 4 MG/2ML IJ SOLN
4.0000 mg | Freq: Four times a day (QID) | INTRAMUSCULAR | Status: DC | PRN
  Administered 2021-08-19: 4 mg via INTRAVENOUS
  Filled 2021-08-16: qty 2

## 2021-08-16 MED ORDER — FENOFIBRATE 160 MG PO TABS
160.0000 mg | ORAL_TABLET | Freq: Every day | ORAL | Status: DC
  Administered 2021-08-16 – 2021-08-20 (×5): 160 mg via ORAL
  Filled 2021-08-16 (×5): qty 1

## 2021-08-16 MED ORDER — GUAIFENESIN-DM 100-10 MG/5ML PO SYRP: 15.0000 mL | ORAL_SOLUTION | ORAL | Status: DC | PRN

## 2021-08-16 MED ORDER — GABAPENTIN 300 MG PO CAPS
300.0000 mg | ORAL_CAPSULE | Freq: Three times a day (TID) | ORAL | Status: DC
Start: 2021-08-16 — End: 2021-08-20
  Administered 2021-08-16 – 2021-08-20 (×13): 300 mg via ORAL
  Filled 2021-08-16 (×14): qty 1

## 2021-08-16 MED ORDER — LORAZEPAM 2 MG/ML IJ SOLN
2.0000 mg | Freq: Once | INTRAMUSCULAR | Status: AC
  Administered 2021-08-16: 2 mg via INTRAVENOUS
  Filled 2021-08-16: qty 1

## 2021-08-16 MED ORDER — HEPARIN SODIUM (PORCINE) 5000 UNIT/ML IJ SOLN
5000.0000 [IU] | Freq: Three times a day (TID) | INTRAMUSCULAR | Status: DC
  Administered 2021-08-17 – 2021-08-18 (×4): 5000 [IU] via SUBCUTANEOUS
  Filled 2021-08-16 (×4): qty 1

## 2021-08-16 MED ORDER — ALBUTEROL SULFATE (2.5 MG/3ML) 0.083% IN NEBU: 2.5200 mg | INHALATION_SOLUTION | Freq: Four times a day (QID) | RESPIRATORY_TRACT | Status: DC | PRN

## 2021-08-16 MED ORDER — HYDRALAZINE HCL 20 MG/ML IJ SOLN: 5.0000 mg | INTRAMUSCULAR | Status: DC | PRN

## 2021-08-16 MED ORDER — LABETALOL HCL 5 MG/ML IV SOLN: 10.0000 mg | INTRAVENOUS | Status: DC | PRN

## 2021-08-16 MED ORDER — DULOXETINE HCL 60 MG PO CPEP
60.0000 mg | ORAL_CAPSULE | Freq: Every day | ORAL | Status: DC
  Administered 2021-08-16 – 2021-08-19 (×4): 60 mg via ORAL
  Filled 2021-08-16 (×4): qty 1

## 2021-08-16 NOTE — Progress Notes (Signed)
Pt states he is very cold, he is shivering and also complaining of increased pain in his LLE incision by knee.  Withdrawing in pain for just the sheet to be gently placed on it and with light palpation for assessment.  Foot warm to touch and positive pulses with doppler.  Pt states foot feels numb but no significant change from baseline. ? ?VS assessed, T 28F, BP: 167/93 (116), HR 120 in AF, SpO2 91% on RA. ? ?Dr. [name] notified, received order for IV ativan.  Will also administer pain meds prn and continue to montior. ? ? ?

## 2021-08-16 NOTE — Progress Notes (Addendum)
?  Progress Note ? ? ? ?08/16/2021 ?10:19 AM ?1 Day Post-Op ? ?Subjective:  sore L leg ? ? ?Vitals:  ? 08/16/21 0736 08/16/21 0849  ?BP: 127/71 132/65  ?Pulse: 62 (!) 121  ?Resp: (!) 21 16  ?Temp: 98.7 ?F (37.1 ?C)   ?SpO2: 98% 96%  ? ?Physical Exam: ?Lungs:  non labored ?Incisions:  L groin with prevena with good seal; L popliteal incision c/d/i ?Extremities:  brisk DP signal by doppler ?Neurologic: A&O ? ?CBC ?   ?Component Value Date/Time  ? WBC 10.4 08/16/2021 0350  ? RBC 4.04 (L) 08/16/2021 0350  ? HGB 10.9 (L) 08/16/2021 0350  ? HGB 13.7 08/11/2021 1011  ? HCT 34.6 (L) 08/16/2021 0350  ? HCT 43.1 08/11/2021 1011  ? PLT 225 08/16/2021 0350  ? PLT 323 08/11/2021 1011  ? MCV 85.6 08/16/2021 0350  ? MCV 83 08/11/2021 1011  ? MCH 27.0 08/16/2021 0350  ? MCHC 31.5 08/16/2021 0350  ? RDW 20.9 (H) 08/16/2021 0350  ? RDW 21.4 (H) 08/11/2021 1011  ? LYMPHSABS 1.6 08/11/2021 1011  ? MONOABS 0.8 07/25/2021 1359  ? EOSABS 0.2 08/11/2021 1011  ? BASOSABS 0.1 08/11/2021 1011  ? ? ?BMET ?   ?Component Value Date/Time  ? NA 137 08/15/2021 2024  ? NA 137 08/11/2021 1011  ? K 4.0 08/15/2021 2024  ? CL 99 08/15/2021 1023  ? CO2 27 08/15/2021 1023  ? GLUCOSE 138 (H) 08/15/2021 1023  ? BUN 19 08/15/2021 1023  ? BUN 20 08/11/2021 1011  ? CREATININE 1.36 (H) 08/16/2021 0350  ? CALCIUM 9.7 08/15/2021 1023  ? GFRNONAA 56 (L) 08/16/2021 0350  ? GFRAA 54 (L) 06/20/2020 1529  ? ? ?INR ?   ?Component Value Date/Time  ? INR 1.1 08/15/2021 1023  ? ? ? ?Intake/Output Summary (Last 24 hours) at 08/16/2021 1019 ?Last data filed at 08/16/2021 0900 ?Gross per 24 hour  ?Intake 3514.19 ml  ?Output 1225 ml  ?Net 2289.19 ml  ? ? ? ?Assessment/Plan:  71 y.o. male is s/p redo L femoral to popliteal bypass with PTFE 1 Day Post-Op  ? ?L foot well perfused with brisk DP signal by doppler ?OOB today with therapy teams ?Martin Majestic will be left in place for about 7 days ?Plan is for retrograde angio R leg via pedal access on Monday; continue to hold  Eliquis ? ? ?Dagoberto Ligas, PA-C ?[facility] ?[phone] ?08/16/2021 ?10:19 AM ? ?I have seen and evaluated the patient.  I agree with the plan as outlined by the PA above. ? ?Annamarie Major ? ?

## 2021-08-16 NOTE — Progress Notes (Signed)
PT Cancellation Note ? ?Patient Details ?Name: [name] [name] ?MRN: [id number] ?DOB: Dec 29, 1950 ? ? ?Cancelled Treatment:    Reason Eval/Treat Not Completed: (P) Pain limiting ability to participate ?RN request PT come back this afternoon as patient is experienced increased pain. PT will follow back as able. ? ?[name] Samson B. Migdalia Dk PT, DPT ?Acute Rehabilitation Services ?Pager [phone] ?Office [phone] ? ? ?[place] ?08/16/2021, 11:38 AM ? ? ?

## 2021-08-16 NOTE — Progress Notes (Signed)
PHARMACIST LIPID MONITORING ? ? ?[name] [name] is a 71 y.o. male admitted on 08/15/2021 for redo of L fempop bypass.  Pharmacy has been consulted to optimize lipid-lowering therapy with the indication of secondary prevention for clinical ASCVD. ? ?Recent Labs: ? ?Lipid Panel (last 6 months):   ?Lab Results  ?Component Value Date  ? CHOL 144 08/16/2021  ? TRIG 181 (H) 08/16/2021  ? HDL 23 (L) 08/16/2021  ? CHOLHDL 6.3 08/16/2021  ? VLDL 36 08/16/2021  ? [place] 85 08/16/2021  ? ? ?Hepatic function panel (last 6 months):   ?Lab Results  ?Component Value Date  ? AST 26 08/15/2021  ? ALT 19 08/15/2021  ? ALKPHOS 42 08/15/2021  ? BILITOT 0.7 08/15/2021  ? ? ?SCr (since admission):   ?Serum creatinine: 1.36 mg/dL (H) 08/16/21 0350 ?Estimated creatinine clearance: 75 mL/min (A) ? ?Current therapy and lipid therapy tolerance ?Current lipid-lowering therapy: Zetia 10 mg daily ? ?Previous lipid-lowering therapies (if applicable):  ? - Atorvastatin 20 mg (10/2011), Simvastatin 10 mg (07/2013), Pravastatin 40 mg (07/2014) ? ?Documented or reported allergies or intolerances to lipid-lowering therapies (if applicable):  ? - Pravastatin > myalgias (11/14/2014) ? - Rosuvastatin > myalgias (06/05/2013) - no record of taking Crestor, may have been mistaken as atorvastatin ? - Atorvastatin > myalgias (06/05/2013) ? - Pitavastatin > myalgias (06/25/2016) ? ?Assessment:   ?Patient prefers no changes in lipid-lowering therapy at this time due to intolerances.Patient is being followed by lipid clinic and patient to follow up if interested in trying new therapies ? ?Plan:   ? ?1.Statin intensity (high intensity recommended for all patients regardless of the LDL):  Statin intolerance noted. No statin changes due to serious side effects (ex. Myalgias with at least 2 different statins). ? ?2.Add ezetimibe (if any one of the following):   Cannot tolerate statin at any dose ? ?3.Refer to lipid clinic:   No - patient not interested ? ?4.Follow-up with:   Primary care provider - [name], FNP ? ?5.Follow-up labs after discharge:  No changes in lipid therapy, repeat a lipid panel in one year.    ? ? ?Laurey Arrow, PharmD ?PGY1 Pharmacy Resident ?08/16/2021  10:24 AM ? ?Please check [email or web address] for unit-specific pharmacy phone numbers. ? ? ?

## 2021-08-16 NOTE — Progress Notes (Signed)
Family at bedside concerned that pt continues to tremor/shiver and that HR frequently up to 130s, although it is unsustained at this time.  Temp up to 100.74F. ? ?Dr. [name] notified of continued elevation in temp. Received orders for UA, urine and blood cultures and CXR.  Will continue to monitor HR for need of prn IV metoprolol for HR sustained >130. ?

## 2021-08-16 NOTE — Op Note (Signed)
? ? ?Patient name: [name] [name] MRN: [id number] DOB: 1951-01-04 Sex: male ? ?08/15/2021 ?Pre-operative Diagnosis: Bilateral lower extremity ulcers ?Post-operative diagnosis:  Same ?Surgeon:  Annamarie Major ?Assistants:  Arlee Muslim. PA ?Procedure:   #1: Left femoral to below-knee popliteal artery bypass graft with 6 mm external ring propatent PTFE ?  #2: Redo left femoral artery exposure ?  #3: Right lower extremity angiogram ?  #4: Failed angioplasty of right peroneal artery ?  #5: Angioplasty of left popliteal, tibioperoneal trunk, and peroneal artery with 3 mm balloon ?  #6: Prevena incisional wound VAC to left groin ?Anesthesia:  General ?Blood Loss:  200 cc ?Specimens:  none ? ?Findings: I was unable to gain reentry in the right peroneal artery.  The patient will be brought back at a later date for pedal access.  I performed a left femoral to below-knee popliteal bypass graft the proximal anastomosis was to the hood of the previous vein bypass graft.  The distal anastomosis was to the below-knee popliteal artery.  I tried to advance a #2 dilator distally but was unable to do so.  After completion, I was not satisfied with the signals.  I performed angiography which showed a very diseased below-knee popliteal, tibioperoneal trunk and proximal peroneal artery which I was able to successfully dilate with a 3 mm balloon. ? ?Indications: This is a 71 year old gentleman with bilateral lower extremity ulcers.  He has a failed left femoral to above-knee popliteal bypass graft with vein and a known right peroneal artery occlusion.  He comes in today for redo left femoral-popliteal bypass graft and attempt at percutaneous revascularization of the right leg. ? ?Procedure:  The patient was identified in the holding area and taken to [facility]  The patient was then placed supine on the table. general anesthesia was administered.  The patient was prepped and draped in the usual sterile fashion.  A time out was called and  antibiotics were administered.  A PA was necessary to expedite the procedure and assist with technical details.  The PA provided assistance with suction, retraction, suture following, and wound closure. ? ?The patient's oblique left previous groin incision was opened with a 10 blade.  Cautery was used about subcutaneous tissue.  I then sharply dissected out the common femoral artery.  There was dense scar tissue.  There was a good palpable pulse within the groin.  I did not dissect out the superficial femoral and profundofemoral artery because of scar tissue.  Attention was then turned towards the below-knee popliteal artery on the left.  I made a medial below-knee incision.  Cautery was used about subcutaneous tissue down to the fascia which was opened sharply.  I then reflected the gastrocnemius muscle posteriorly and dissected out the below-knee popliteal artery which was diseased but soft.  A long curved Gore tunneler.  The patient was then fully heparinized. ? ?Left common femoral artery was cannulated with a micropuncture needle.  An 018 wire was advanced without resistance followed placement of micropuncture sheath.  A Bentson wire was then inserted.  The aortic bifurcation was crossed with an Omni Flush catheter.  A 6 French 65 cm sheath was then advanced over the bifurcation into the superficial femoral artery.  I then performed angiography which revealed a diseased popliteal artery and an occluded peroneal artery with reconstitution distally.  I used a V-18 wire with the support of a 3 mm Sterling balloon and attempted to recanalize the occluded tibioperoneal trunk and peroneal artery.  I was unable to gain reentry.  After multiple attempts I did create a dissection plane that I could not reenter from.  I elected to abort this portion of the procedure and bring the patient back at a later date for pedal access. ? ?I then set up for the proximal anastomosis on the left.  A Cooley J clamp was placed on the  common femoral artery and the previous sheath was removed.  I opened the hood of the previous vein bypass graft.  A 6 mm external ring propatent PTFE graft was brought onto the field and beveled to fit the size the arteriotomy.  A running anastomosis was created with 6-0 Prolene.  Once this was completed the clamps were released.  There was excellent flow through the graft.  The graft was then brought through the previously created tunnel making sure to maintain proper orientation.  The graft was pulled the appropriate length.  A tourniquet was then placed on the upper thigh.  An Esmarch was used to exsanguinate the leg and the tourniquet was taken to 250 mm of pressure.  I then opened the below-knee popliteal artery which was soft but had thick intima.  I tried to pass a #2 dilator distally but was unable to do so.  I then straighten the leg and cut the graft to the appropriate length and beveled the graft to fit the size the arteriotomy.  A running anastomosis was created 6-0 Prolene prior to completion the tourniquet was let down and the appropriate flushing maneuvers were performed.  The anastomosis was completed.  I evaluated the distal popliteal artery with Doppler and was not satisfied with the signal. ? ?I then cannulated the proximal portion of the Gore-Tex graft and placed a micropuncture sheath.  Contrast injection was performed.  This showed a widely patent bypass graft.  There did appear to be significant disease distal to the bypass graft extending into the tibioperoneal trunk and peroneal artery which was the single-vessel runoff.  I then inserted a 6 [place] sheath.  I advanced a V-18 wire across the stenosis into the peroneal artery.  A 3 mm Sterling balloon was used to perform balloon angioplasty of the below-knee popliteal artery, tibioperoneal trunk, and peroneal artery.  This was done at nominal pressure for 90 seconds.  Completion imaging revealed significant improvement within the outflow  vessels.  I was satisfied with these results.  The sheath was removed and the graftotomy closed with a 6-0 Prolene.  At this point, the patient had a brisk popliteal artery Doppler signal as well as a posterior tibial signal.  I was satisfied with these results.  I reversed the heparin with 50 mg of protamine.  The wounds were irrigated and hemostasis was achieved.  The PA closed the below-knee incision by reapproximating the fascia with 2-0 Vicryl, subcutaneous tissue with 3-0 Vicryl and then subcuticular closure.  I closed the groin incision by reapproximating the femoral sheath with 2-0 Vicryl.  The subcutaneous tissue was then closed with additional layers of 2-0 and 3-0 Vicryl followed by subcuticular closure.  A Prevena incisional wound VAC was placed over the groin incision and Dermabond was placed over the distal incision.  The patient was successfully extubated and taken recovery in stable condition. ? ? ?Disposition: To PACU stable. ? ? ?V. Annamarie Major, M.D., FACS ?[facility] ?Office: [phone] ?Pager:  [phone]  ?

## 2021-08-16 NOTE — Transfer of Care (Signed)
Immediate Anesthesia Transfer of Care Note ? ?Patient: [name] [name] ? ?Procedure(s) Performed: REDO LEFT FEMORAL-POPLITEAL BYPASS USING PROPATEN GRAFT (Left) ?ATTEMPTED RIGHT PERONEAL  ANGIOPLASTY, LEFT PERONEAL ANGIOPLASTY (Leg Lower) ?RIGHT LOWER EXTREMITY ANGIOGRAM (Right: Leg Lower) ? ?Patient Location: PACU ? ?Anesthesia Type:General ? ?Level of Consciousness: drowsy ? ?Airway & Oxygen Therapy: Patient Spontanous Breathing and Patient connected to face mask oxygen ? ?Post-op Assessment: Report given to RN and Post -op Vital signs reviewed and stable ? ?Post vital signs: Reviewed and stable ? ?Last Vitals:  ?Vitals Value Taken Time  ?BP 157/92 08/16/21 0016  ?Temp    ?Pulse 82 08/16/21 0018  ?Resp 19 08/16/21 0018  ?SpO2 100 % 08/16/21 0018  ?Vitals shown include unvalidated device data. ? ?Last Pain:  ?Vitals:  ? 08/15/21 0954  ?TempSrc:   ?PainSc: 10-Worst pain ever  ?   ? ?Patients Stated Pain Goal: 3 (08/15/21 0954) ? ?Complications: No notable events documented. ?

## 2021-08-16 NOTE — Evaluation (Signed)
Occupational Therapy Evaluation ?Patient Details ?Name: [name] [name] ?MRN: [id number] ?DOB: [birth year] ?Today's Date: 08/16/2021 ? ? ?History of Present Illness 71 year old male with bilateral lower extremity ulcers.  He has a failed left femoral to above-knee popliteal bypass graft with vein and a known right peroneal artery occlusion.  Presents to vascular surgery 3/31 for redo left femoral-popliteal bypass graft and R popliteal angioplasty and stenting PMH:  PAF, HTN, HLD, DM, prior CVA, PAD and CAD with unstable angina.  ? ?Clinical Impression ?  ?Patient admitted for the procedure above.  PTA he lives at home with his spouse, who is able to provide assistance.  He generally uses a SPC for mobility in the home, and a RW outside.  He is needing assist with lower body ADL, but is able to handle his own medication management.  Deficits impacting independence are listed below.  Currently he is needing up to [place] for basic bed mobility, and closer to [place] for lower body ADL due to pain.  OT will follow in the acute setting, and as this point no OT is anticipated post acute, but this will depend on progress.     ?   ? ?Recommendations for follow up therapy are one component of a multi-disciplinary discharge planning process, led by the attending physician.  Recommendations may be updated based on patient status, additional functional criteria and insurance authorization.  ? ?Follow Up Recommendations ? No OT follow up  ?  ?Assistance Recommended at Discharge Intermittent Supervision/Assistance  ?Patient can return home with the following A little help with walking and/or transfers;A little help with bathing/dressing/bathroom;Assist for transportation;Help with stairs or ramp for entrance ? ?  ?Functional Status Assessment ? Patient has had a recent decline in their functional status and demonstrates the ability to make significant improvements in function in a reasonable and predictable amount of time.  ?Equipment  Recommendations ? None recommended by OT  ?  ?Recommendations for Other Services   ? ? ?  ?Precautions / Restrictions Precautions ?Precautions: Fall ?Precaution Comments: wounds, decreased skin integrity to B LE's, watch HR ?Restrictions ?Weight Bearing Restrictions: No  ? ?  ? ?Mobility Bed Mobility ?Overal bed mobility: Needs Assistance ?Bed Mobility: Supine to Sit, Sit to Supine ?  ?  ?Supine to sit: Min guard, HOB elevated ?Sit to supine: Min assist ?  ?General bed mobility comments: incr time and effort ?Patient Response: Cooperative ? ?Transfers ?  ?  ?  ?  ?  ?  ?  ?  ?  ?General transfer comment: deferred ?  ? ?  ?Balance Overall balance assessment: Needs assistance ?Sitting-balance support: Feet supported ?Sitting balance-Leahy Scale: Fair ?  ?  ?  ?  ?  ?  ?  ?  ?  ?  ?  ?  ?  ?  ?  ?  ?   ? ?ADL either performed or assessed with clinical judgement  ? ?ADL   ?  ?  ?Grooming: Set up;Sitting ?  ?Upper Body Bathing: Set up;Sitting ?  ?Lower Body Bathing: Maximal assistance;Sit to/from stand ?  ?Upper Body Dressing : Set up;Sitting ?  ?Lower Body Dressing: Moderate assistance;Sit to/from stand ?  ?  ?  ?  ?  ?  ?  ?  ?General ADL Comments: self limits to a degree.  does not want to walk until tomorrow, but understands the importance of mobility.  ? ? ? ?Vision Patient Visual Report: No change from baseline ?   ?   ?  Perception Perception ?Perception: Within Functional Limits ?  ?Praxis Praxis ?Praxis: Intact ?  ? ?Pertinent Vitals/Pain Pain Assessment ?Faces Pain Scale: Hurts whole lot ?Pain Location: LLE ?Pain Descriptors / Indicators: Discomfort, Grimacing, Guarding, Sharp, Shooting, Pins and needles ?Pain Intervention(s): Patient requesting pain meds-RN notified  ? ? ? ?Hand Dominance Right ?  ?Extremity/Trunk Assessment Upper Extremity Assessment ?Upper Extremity Assessment: Overall WFL for tasks assessed ?  ?Lower Extremity Assessment ?Lower Extremity Assessment: Defer to PT evaluation ?  ?Cervical / Trunk  Assessment ?Cervical / Trunk Assessment: Other exceptions ?Cervical / Trunk Exceptions: Increased body habitus ?  ?Communication Communication ?Communication: No difficulties ?  ?Cognition Arousal/Alertness: Awake/alert ?Behavior During Therapy: [facility] for tasks assessed/performed ?Overall Cognitive Status: Within Functional Limits for tasks assessed ?  ?  ?  ?  ?  ?  ?  ?  ?  ?  ?  ?  ?  ?  ?  ?  ?  ?  ?  ?General Comments   HR to 139 ? ?  ?Exercises   ?  ?Shoulder Instructions    ? ? ?Home Living Family/patient expects to be discharged to:: Private residence ?Living Arrangements: Spouse/significant other ?Available Help at Discharge: Family;Available 24 hours/day ?Type of Home: House ?Home Access: Stairs to enter ?Entrance Stairs-Number of Steps: 4 ?Entrance Stairs-Rails: Right;Left ?Home Layout: One level ?  ?  ?Bathroom Shower/Tub: Tub/shower unit ?  ?Bathroom Toilet: Standard ?Bathroom Accessibility: Yes ?How Accessible: Accessible via walker ?Home Equipment: Cane - single Barista (2 wheels);Wheelchair - manual;BSC/3in1;Grab bars - toilet ?  ?  ?  ? ?  ?Prior Functioning/Environment Prior Level of Function : Needs assist ?  ?  ?  ?  ?  ?  ?Mobility Comments: Using the cane more for inside, and the RW for outside.  Keeps his wheelchair in the truck. ?ADLs Comments: Spouse provides [place] for showers.  Other days he does sink  bath.  Cares for his own meds, and drives on occasion. ?  ? ?  ?  ?OT Problem List: Decreased strength;Decreased range of motion;Decreased activity tolerance;Impaired balance (sitting and/or standing);Pain ?  ?   ?OT Treatment/Interventions: Self-care/ADL training;Therapeutic exercise;DME and/or AE instruction;Therapeutic activities;Balance training;Patient/family education  ?  ?OT Goals(Current goals can be found in the care plan section) Acute Rehab OT Goals ?Patient Stated Goal: Return home ?OT Goal Formulation: With patient ?Time For Goal Achievement: 08/30/21 ?Potential to  Achieve Goals: Good ?ADL Goals ?Pt Will Perform Grooming: with modified independence;standing ?Pt Will Perform Upper Body Dressing: with modified independence;sitting ?Pt Will Perform Lower Body Dressing: with min assist;sit to/from stand ?Pt Will Transfer to Toilet: with modified independence;ambulating ?Pt Will Perform Toileting - Clothing Manipulation and hygiene: with modified independence;sit to/from stand  ?OT Frequency:   ?  ? ?Co-evaluation   ?  ?  ?  ?  ? ?  ?AM-PAC OT "6 Clicks" Daily Activity     ?Outcome Measure Help from another person eating meals?: None ?Help from another person taking care of personal grooming?: A Little ?Help from another person toileting, which includes using toliet, bedpan, or urinal?: A Little ?Help from another person bathing (including washing, rinsing, drying)?: A Lot ?Help from another person to put on and taking off regular upper body clothing?: A Little ?Help from another person to put on and taking off regular lower body clothing?: A Lot ?6 Click Score: 17 ?  ?End of Session Nurse Communication: Mobility status ? ?Activity Tolerance: Patient limited by pain ?Patient left:  in bed;with call bell/phone within reach;with nursing/sitter in room ? ?OT Visit Diagnosis: Unsteadiness on feet (R26.81);Other abnormalities of gait and mobility (R26.89);Muscle weakness (generalized) (M62.81);Pain ?Pain - Right/Left: Left ?Pain - part of body: Leg  ?              ?Time: 0935-1000 ?OT Time Calculation (min): 25 min ?Charges:  OT General Charges ?$OT Visit: 1 Visit ?OT Evaluation ?$OT Eval Moderate Complexity: 1 Mod ?OT Treatments ?$Self Care/Home Management : 8-22 mins ? ?08/16/2021 ? ?RP, OTR/L ? ?Acute Rehabilitation Services ? ?Office:  [phone] ? ? ?Lindsee Labarre D Mattheus Rauls ?08/16/2021, 10:12 AM ?

## 2021-08-16 NOTE — Progress Notes (Signed)
PT Cancellation Note ? ?Patient Details ?Name: [name] [name] ?MRN: [id number] ?DOB: [birth year] ? ? ?Cancelled Treatment:    Reason Eval/Treat Not Completed: (P) Medical issues which prohibited therapy Pt with fever and elevated HR. PT will follow back for Evaluation tomorrow as appropriate. ? ?Mitsuko Luera B. Migdalia Dk PT, DPT ?Acute Rehabilitation Services ?Pager [phone] ?Office [phone] ? ? ? ?[place] ?08/16/2021, 2:59 PM ? ? ?

## 2021-08-17 ENCOUNTER — Other Ambulatory Visit: Payer: Self-pay | Admitting: Family

## 2021-08-17 LAB — GLUCOSE, CAPILLARY
Glucose-Capillary: 227 mg/dL — ABNORMAL HIGH (ref 70–99)
Glucose-Capillary: 228 mg/dL — ABNORMAL HIGH (ref 70–99)
Glucose-Capillary: 238 mg/dL — ABNORMAL HIGH (ref 70–99)
Glucose-Capillary: 232 mg/dL — ABNORMAL HIGH (ref 70–99)

## 2021-08-17 LAB — CBC
HCT: 32.5 % — ABNORMAL LOW (ref 39.0–52.0)
Hemoglobin: 10.1 g/dL — ABNORMAL LOW (ref 13.0–17.0)
MCH: 26.9 pg (ref 26.0–34.0)
MCHC: 31.1 g/dL (ref 30.0–36.0)
MCV: 86.7 fL (ref 80.0–100.0)
Platelets: 227 10*3/uL (ref 150–400)
RBC: 3.75 MIL/uL — ABNORMAL LOW (ref 4.22–5.81)
RDW: 20.8 % — ABNORMAL HIGH (ref 11.5–15.5)
WBC: 9.3 10*3/uL (ref 4.0–10.5)
nRBC: 0 % (ref 0.0–0.2)

## 2021-08-17 LAB — URINE CULTURE
Culture: NO GROWTH
Special Requests: NORMAL

## 2021-08-17 LAB — BASIC METABOLIC PANEL
Anion gap: 7 (ref 5–15)
BUN: 15 mg/dL (ref 8–23)
CO2: 26 mmol/L (ref 22–32)
Calcium: 8.8 mg/dL — ABNORMAL LOW (ref 8.9–10.3)
Chloride: 99 mmol/L (ref 98–111)
Creatinine, Ser: 1.44 mg/dL — ABNORMAL HIGH (ref 0.61–1.24)
GFR, Estimated: 52 mL/min — ABNORMAL LOW (ref >60)
Glucose, Bld: 242 mg/dL — ABNORMAL HIGH (ref 70–99)
Potassium: 4.4 mmol/L (ref 3.5–5.1)
Sodium: 132 mmol/L — ABNORMAL LOW (ref 135–145)

## 2021-08-17 MED ORDER — ALPRAZOLAM 0.5 MG PO TABS
0.5000 mg | ORAL_TABLET | Freq: Every day | ORAL | Status: DC | PRN
  Administered 2021-08-17: 0.5 mg via ORAL
  Filled 2021-08-17: qty 1

## 2021-08-17 MED ORDER — FUROSEMIDE 20 MG PO TABS
20.0000 mg | ORAL_TABLET | Freq: Every day | ORAL | Status: DC
  Administered 2021-08-17 – 2021-08-20 (×4): 20 mg via ORAL
  Filled 2021-08-17 (×4): qty 1

## 2021-08-17 NOTE — Anesthesia Postprocedure Evaluation (Signed)
Anesthesia Post Note ? ?Patient: SHNEUR WHITTENBURG ? ?Procedure(s) Performed: REDO LEFT FEMORAL-POPLITEAL BYPASS USING PROPATEN GRAFT (Left) ?ATTEMPTED RIGHT PERONEAL  ANGIOPLASTY, LEFT PERONEAL ANGIOPLASTY (Leg Lower) ?RIGHT LOWER EXTREMITY ANGIOGRAM (Right: Leg Lower) ? ?  ? ?Patient location during evaluation: PACU ?Anesthesia Type: General ?Level of consciousness: awake and alert ?Pain management: pain level controlled ?Vital Signs Assessment: post-procedure vital signs reviewed and stable ?Respiratory status: spontaneous breathing, nonlabored ventilation, respiratory function stable and patient connected to nasal cannula oxygen ?Cardiovascular status: blood pressure returned to baseline and stable ?Postop Assessment: no apparent nausea or vomiting ?Anesthetic complications: no ? ? ?No notable events documented. ? ?Last Vitals:  ?Vitals:  ? 08/17/21 1300 08/17/21 1709  ?BP: (!) 158/76 (!) 148/88  ?Pulse: 93 (!) 104  ?Resp: 19 20  ?Temp: 37.3 ?C 37.4 ?C  ?SpO2: 97% 98%  ?  ?Last Pain:  ?Vitals:  ? 08/17/21 1709  ?TempSrc: Oral  ?PainSc:   ? ? ?  ?  ?  ?  ?  ?  ? ?[place] ? ? ? ? ?

## 2021-08-17 NOTE — Evaluation (Signed)
Physical Therapy Evaluation ?Patient Details ?Name: [name] [name] ?MRN: [id number] ?DOB: [birth year] ?Today's Date: 08/17/2021 ? ?History of Present Illness ? 71 year old male admitted 08/15/21 with bilateral lower extremity ulcers.  He has a failed left femoral to above-knee popliteal bypass graft with vein and a known right peroneal artery occlusion.  Presents to vascular surgery 3/31 for redo left femoral-popliteal bypass graft and R popliteal angioplasty and stenting PMH:  PAF, HTN, HLD, DM, prior CVA, PAD and CAD with unstable angina. ?  ?Clinical Impression ? Pt presents with condition above and deficits mentioned below, see PT Problem List. PTA, he was mod I for mobility using a RW or SPC for mobility, living with his wife in a 1-level house with 4 STE. Currently, pt is primarily limited in mobility by L leg and L scrotum pain, resulting in him being unable to take any steps today. He only required minA for bed mobility and transfers to stand today, so I suspect as his pain improves his functional mobility will progress well. Thus, recommending HHPT follow-up at this time. However, if his mobility status does not improve as expected, then he may need more aggressive rehab prior to return home. Will continue to follow acutely.  ?   ? ?Recommendations for follow up therapy are one component of a multi-disciplinary discharge planning process, led by the attending physician.  Recommendations may be updated based on patient status, additional functional criteria and insurance authorization. ? ?Follow Up Recommendations Home health PT (pending progress) ? ?  ?Assistance Recommended at Discharge Intermittent Supervision/Assistance  ?Patient can return home with the following ? A lot of help with walking and/or transfers;A lot of help with bathing/dressing/bathroom;Assistance with cooking/housework;Assist for transportation;Help with stairs or ramp for entrance ? ?  ?Equipment Recommendations None recommended by PT   ?Recommendations for Other Services ?    ?  ?Functional Status Assessment Patient has had a recent decline in their functional status and demonstrates the ability to make significant improvements in function in a reasonable and predictable amount of time.  ? ?  ?Precautions / Restrictions Precautions ?Precautions: Fall ?Precaution Comments: wounds, decreased skin integrity to B LE's, watch HR ?Restrictions ?Weight Bearing Restrictions: No  ? ?  ? ?Mobility ? Bed Mobility ?Overal bed mobility: Needs Assistance ?Bed Mobility: Supine to Sit, Sit to Supine ?  ?  ?Supine to sit: Min assist, HOB elevated ?Sit to supine: Min assist ?  ?General bed mobility comments: MinA to bring hips to EOB and trunk to ascend to sit EOB with HOB elevated, limited by L leg and scrotum pain. MinA to manage legs back onto bed to supine. ?  ? ?Transfers ?Overall transfer level: Needs assistance ?Equipment used: Rolling walker (2 wheels) ?Transfers: Sit to/from Stand ?Sit to Stand: Min assist, From elevated surface ?  ?  ?  ?  ?  ?General transfer comment: Pt able to display good power up, but needing minA for stability primarily. ?  ? ?Ambulation/Gait ?  ?  ?  ?  ?  ?  ?Pre-gait activities: cued pt to perform lateral wt shifting in standing, but only performing ~2x each direction before sitting due to pain ?General Gait Details: Unable to tolerate pain to take steps today. ? ?Stairs ?  ?  ?  ?  ?  ? ?Wheelchair Mobility ?  ? ?Modified Rankin (Stroke Patients Only) ?  ? ?  ? ?Balance Overall balance assessment: Needs assistance ?Sitting-balance support: Feet supported, Single extremity supported, Bilateral upper extremity supported ?  Sitting balance-Leahy Scale: Fair ?Sitting balance - Comments: Uses UEs to unweight painful scrotum ?  ?Standing balance support: Bilateral upper extremity supported, Reliant on assistive device for balance ?Standing balance-Leahy Scale: Poor ?Standing balance comment: Reliant on RW and min guard-minA to stand ?   ?  ?  ?  ?  ?  ?  ?  ?  ?  ?  ?   ? ? ? ?Pertinent Vitals/Pain Pain Assessment ?Pain Assessment: Faces ?Faces Pain Scale: Hurts whole lot ?Pain Location: L leg and L scrotum ?Pain Descriptors / Indicators: Discomfort, Grimacing, Guarding, Crying ?Pain Intervention(s): Limited activity within patient's tolerance, Premedicated before session, Monitored during session, Repositioned  ? ? ?Home Living Family/patient expects to be discharged to:: Private residence ?Living Arrangements: Spouse/significant other ?Available Help at Discharge: Family;Available 24 hours/day ?Type of Home: House ?Home Access: Stairs to enter ?Entrance Stairs-Rails: Right;Left ?Entrance Stairs-Number of Steps: 4 ?  ?Home Layout: One level ?Home Equipment: Cane - single Barista (2 wheels);Wheelchair - manual;BSC/3in1;Grab bars - toilet ?   ?  ?Prior Function Prior Level of Function : Needs assist ?  ?  ?  ?  ?  ?  ?Mobility Comments: Using the cane more for inside, and the RW for outside.  Keeps his wheelchair in the truck. ?ADLs Comments: Spouse provides Min A for showers.  Other days he does sink  bath.  Cares for his own meds, and drives on occasion. ?  ? ? ?Hand Dominance  ? Dominant Hand: Right ? ?  ?Extremity/Trunk Assessment  ? Upper Extremity Assessment ?Upper Extremity Assessment: Defer to OT evaluation ?  ? ?Lower Extremity Assessment ?Lower Extremity Assessment: Generalized weakness;LLE deficits/detail;RLE deficits/detail ?RLE Deficits / Details: black spots with wounds noted distally ?RLE Sensation: history of peripheral neuropathy ?LLE Deficits / Details: L leg edema noted, warm to palpation at foot; toes noted to be black with wounds ?LLE Sensation: history of peripheral neuropathy ?  ? ?Cervical / Trunk Assessment ?Cervical / Trunk Assessment: Other exceptions ?Cervical / Trunk Exceptions: Increased body habitus  ?Communication  ? Communication: No difficulties  ?Cognition Arousal/Alertness: Awake/alert ?Behavior  During Therapy: [facility] for tasks assessed/performed ?Overall Cognitive Status: Within Functional Limits for tasks assessed ?  ?  ?  ?  ?  ?  ?  ?  ?  ?  ?  ?  ?  ?  ?  ?  ?General Comments: Distracted by pain ?  ?  ? ?  ?General Comments General comments (skin integrity, edema, etc.): HR >120 with standing; elevated scrotum and L leg supine in bed end of session ? ?  ?Exercises    ? ?Assessment/Plan  ?  ?PT Assessment Patient needs continued PT services  ?PT Problem List Decreased strength;Decreased activity tolerance;Decreased balance;Decreased mobility;Cardiopulmonary status limiting activity;Impaired sensation;Pain;Decreased skin integrity ? ?   ?  ?PT Treatment Interventions DME instruction;Gait training;Functional mobility training;Therapeutic activities;Therapeutic exercise;Patient/family education;Stair training;Balance training;Neuromuscular re-education   ? ?PT Goals (Current goals can be found in the Care Plan section)  ?Acute Rehab PT Goals ?Patient Stated Goal: to get back to normal ?PT Goal Formulation: With patient ?Time For Goal Achievement: 08/31/21 ?Potential to Achieve Goals: Good ? ?  ?Frequency Min 3X/week ?  ? ? ?Co-evaluation   ?  ?  ?  ?  ? ? ?  ?AM-PAC PT "6 Clicks" Mobility  ?Outcome Measure Help needed turning from your back to your side while in a flat bed without using bedrails?: A Little ?Help needed moving from  lying on your back to sitting on the side of a flat bed without using bedrails?: A Little ?Help needed moving to and from a bed to a chair (including a wheelchair)?: Total ?Help needed standing up from a chair using your arms (e.g., wheelchair or bedside chair)?: A Little ?Help needed to walk in hospital room?: Total ?Help needed climbing 3-5 steps with a railing? : Total ?6 Click Score: 12 ? ?  ?End of Session Equipment Utilized During Treatment: Gait belt ?Activity Tolerance: Patient limited by pain;Patient tolerated treatment well ?Patient left: in bed;with call bell/phone within  reach;with bed alarm set;with family/visitor present ?  ?PT Visit Diagnosis: Unsteadiness on feet (R26.81);Muscle weakness (generalized) (M62.81);Difficulty in walking, not elsewhere classified (R26.2);Pain ?[facility]

## 2021-08-17 NOTE — Progress Notes (Addendum)
?  Progress Note ? ? ? ?08/17/2021 ?8:08 AM ?2 Days Post-Op ? ?Subjective:  febrile with chills yesterday however he felt better overnight and this morning ? ? ?Vitals:  ? 08/16/21 2345 08/17/21 0408  ?BP: (!) 125/58 130/66  ?Pulse: 92 81  ?Resp: 20 18  ?Temp: 99.2 ?F (37.3 ?C) 97.9 ?F (36.6 ?C)  ?SpO2: 97% 98%  ? ?Physical Exam: ?Lungs:  non labored ?Incisions:  L groin with prevena vac; L popliteal incision c/d/I without hematoma ?Extremities:  brisk L DP, peroneal, and PT by doppler ?Neurologic: A&O ? ?CBC ?   ?Component Value Date/Time  ? WBC 9.3 08/17/2021 0134  ? RBC 3.75 (L) 08/17/2021 0134  ? HGB 10.1 (L) 08/17/2021 0134  ? HGB 13.7 08/11/2021 1011  ? HCT 32.5 (L) 08/17/2021 0134  ? HCT 43.1 08/11/2021 1011  ? PLT 227 08/17/2021 0134  ? PLT 323 08/11/2021 1011  ? MCV 86.7 08/17/2021 0134  ? MCV 83 08/11/2021 1011  ? MCH 26.9 08/17/2021 0134  ? MCHC 31.1 08/17/2021 0134  ? RDW 20.8 (H) 08/17/2021 0134  ? RDW 21.4 (H) 08/11/2021 1011  ? LYMPHSABS 1.6 08/11/2021 1011  ? MONOABS 0.8 07/25/2021 1359  ? EOSABS 0.2 08/11/2021 1011  ? BASOSABS 0.1 08/11/2021 1011  ? ? ?BMET ?   ?Component Value Date/Time  ? NA 132 (L) 08/17/2021 0134  ? NA 137 08/11/2021 1011  ? K 4.4 08/17/2021 0134  ? CL 99 08/17/2021 0134  ? CO2 26 08/17/2021 0134  ? GLUCOSE 242 (H) 08/17/2021 0134  ? BUN 15 08/17/2021 0134  ? BUN 20 08/11/2021 1011  ? CREATININE 1.44 (H) 08/17/2021 0134  ? CALCIUM 8.8 (L) 08/17/2021 0134  ? GFRNONAA 52 (L) 08/17/2021 0134  ? GFRAA 54 (L) 06/20/2020 1529  ? ? ?INR ?   ?Component Value Date/Time  ? INR 1.1 08/15/2021 1023  ? ? ? ?Intake/Output Summary (Last 24 hours) at 08/17/2021 0808 ?Last data filed at 08/17/2021 1287 ?Gross per 24 hour  ?Intake --  ?Output 1050 ml  ?Net -1050 ml  ? ? ? ?Assessment/Plan:  71 y.o. male is s/p redo L femoral to pop bypass with PTFE 2 Days Post-Op  ? ?L foot well perfused based on doppler exam ?OOB today ?Fever: afebrile today, WBC wnl, UA negative, chest xr looks wet, restart home dose  of lasix, encouraged IS ?Plan is for RLE angiogram via pedal access tomorrow with Dr. Donzetta Matters; npo past midnight, consent ordered ? ? ? ?Dagoberto Ligas, PA-C ?[facility] ?[phone] ?08/17/2021 ?8:08 AM ? ?I agree with the above.  Have seen and evaluated patient.  He did have low-grade fever yesterday along with some shaking.  Cultures were sent.  All are unremarkable.  He feels much better today.  I discussed proceeding with attempts at peroneal artery revascularization via a pedal approach by Dr. Donzetta Matters tomorrow.  If this is unsuccessful, he will need surgical revascularization.  His Eliquis remains on hold.  I will need to restart his Plavix.  This will be done if he has successful revascularization tomorrow, if not, it will be done after surgical intervention. ? ?Annamarie Major ? ?

## 2021-08-18 ENCOUNTER — Encounter (HOSPITAL_COMMUNITY): Payer: Self-pay | Admitting: Surgery

## 2021-08-18 ENCOUNTER — Encounter (HOSPITAL_COMMUNITY)
Admission: RE | Disposition: A | Discharge status: Home Health | Payer: Self-pay | Source: Ambulatory Visit | Attending: Surgery

## 2021-08-18 HISTORY — PX: PERIPHERAL VASCULAR INTERVENTION: CATH118257

## 2021-08-18 HISTORY — PX: LOWER EXTREMITY ANGIOGRAPHY: CATH118251

## 2021-08-18 HISTORY — PX: PERIPHERAL VASCULAR BALLOON ANGIOPLASTY: CATH118281

## 2021-08-18 LAB — GLUCOSE, CAPILLARY
Glucose-Capillary: 212 mg/dL — ABNORMAL HIGH (ref 70–99)
Glucose-Capillary: 249 mg/dL — ABNORMAL HIGH (ref 70–99)
Glucose-Capillary: 214 mg/dL — ABNORMAL HIGH (ref 70–99)
Glucose-Capillary: 250 mg/dL — ABNORMAL HIGH (ref 70–99)

## 2021-08-18 SURGERY — LOWER EXTREMITY ANGIOGRAPHY
Anesthesia: LOCAL | Laterality: Right

## 2021-08-18 MED ORDER — SODIUM CHLORIDE 0.9 % IV SOLN
INTRAVENOUS | Status: DC
Start: 2021-08-18 — End: 2021-08-19

## 2021-08-18 MED ORDER — FENTANYL CITRATE (PF) 100 MCG/2ML IJ SOLN
INTRAMUSCULAR | Status: AC
  Filled 2021-08-18: qty 2

## 2021-08-18 MED ORDER — HEPARIN SODIUM (PORCINE) 5000 UNIT/ML IJ SOLN
5000.0000 [IU] | Freq: Three times a day (TID) | INTRAMUSCULAR | Status: DC
  Administered 2021-08-18 – 2021-08-19 (×2): 5000 [IU] via SUBCUTANEOUS
  Filled 2021-08-18 (×2): qty 1

## 2021-08-18 MED ORDER — HEPARIN (PORCINE) IN NACL 1000-0.9 UT/500ML-% IV SOLN
INTRAVENOUS | Status: AC
  Filled 2021-08-18: qty 500

## 2021-08-18 MED ORDER — SODIUM CHLORIDE 0.9 % IV SOLN: 250.0000 mL | INTRAVENOUS | Status: DC | PRN

## 2021-08-18 MED ORDER — IODIXANOL 320 MG/ML IV SOLN
INTRAVENOUS | Status: DC | PRN
Start: 2021-08-18 — End: 2021-08-18
  Administered 2021-08-18: 90 mL via INTRA_ARTERIAL

## 2021-08-18 MED ORDER — HYDROMORPHONE HCL 1 MG/ML IJ SOLN: 0.5000 mg | INTRAMUSCULAR | Status: DC | PRN

## 2021-08-18 MED ORDER — HEPARIN SODIUM (PORCINE) 1000 UNIT/ML IJ SOLN
INTRAMUSCULAR | Status: DC | PRN
  Administered 2021-08-18: 8000 [IU] via INTRAVENOUS

## 2021-08-18 MED ORDER — ACETAMINOPHEN 325 MG PO TABS: 650.0000 mg | ORAL_TABLET | ORAL | Status: DC | PRN

## 2021-08-18 MED ORDER — HEPARIN (PORCINE) IN NACL 1000-0.9 UT/500ML-% IV SOLN
INTRAVENOUS | Status: DC | PRN
  Administered 2021-08-18 (×2): 500 mL

## 2021-08-18 MED ORDER — HYDRALAZINE HCL 20 MG/ML IJ SOLN: 5.0000 mg | INTRAMUSCULAR | Status: DC | PRN

## 2021-08-18 MED ORDER — MIDAZOLAM HCL 2 MG/2ML IJ SOLN
INTRAMUSCULAR | Status: AC
  Filled 2021-08-18: qty 2

## 2021-08-18 MED ORDER — FENTANYL CITRATE (PF) 100 MCG/2ML IJ SOLN
INTRAMUSCULAR | Status: DC | PRN
  Administered 2021-08-18 (×4): 50 ug via INTRAVENOUS

## 2021-08-18 MED ORDER — CLOPIDOGREL BISULFATE 75 MG PO TABS: 300.0000 mg | ORAL_TABLET | Freq: Once | ORAL | Status: DC

## 2021-08-18 MED ORDER — SODIUM CHLORIDE 0.9 % IV SOLN: INTRAVENOUS | Status: AC

## 2021-08-18 MED ORDER — CLOPIDOGREL BISULFATE 75 MG PO TABS: 75.0000 mg | ORAL_TABLET | Freq: Every day | ORAL | Status: DC

## 2021-08-18 MED ORDER — ONDANSETRON HCL 4 MG/2ML IJ SOLN: 4.0000 mg | Freq: Four times a day (QID) | INTRAMUSCULAR | Status: DC | PRN

## 2021-08-18 MED ORDER — CLOPIDOGREL BISULFATE 300 MG PO TABS
ORAL_TABLET | ORAL | Status: DC | PRN
  Administered 2021-08-18: 300 mg via ORAL

## 2021-08-18 MED ORDER — OXYCODONE HCL 5 MG PO TABS: 5.0000 mg | ORAL_TABLET | ORAL | Status: DC | PRN

## 2021-08-18 MED ORDER — SODIUM CHLORIDE 0.9% FLUSH
3.0000 mL | Freq: Two times a day (BID) | INTRAVENOUS | Status: DC
  Administered 2021-08-18 – 2021-08-20 (×4): 3 mL via INTRAVENOUS

## 2021-08-18 MED ORDER — LABETALOL HCL 5 MG/ML IV SOLN: 10.0000 mg | INTRAVENOUS | Status: DC | PRN

## 2021-08-18 MED ORDER — CLOPIDOGREL BISULFATE 75 MG PO TABS
75.0000 mg | ORAL_TABLET | Freq: Every day | ORAL | Status: DC
  Administered 2021-08-19 – 2021-08-20 (×2): 75 mg via ORAL
  Filled 2021-08-18 (×2): qty 1

## 2021-08-18 MED ORDER — LIDOCAINE HCL (PF) 1 % IJ SOLN
INTRAMUSCULAR | Status: DC | PRN
  Administered 2021-08-18: 2 mL via INTRADERMAL

## 2021-08-18 MED ORDER — MIDAZOLAM HCL 2 MG/2ML IJ SOLN
INTRAMUSCULAR | Status: DC | PRN
  Administered 2021-08-18: 2 mg via INTRAVENOUS

## 2021-08-18 MED ORDER — CLOPIDOGREL BISULFATE 300 MG PO TABS
ORAL_TABLET | ORAL | Status: AC
  Filled 2021-08-18: qty 1

## 2021-08-18 MED ORDER — SODIUM CHLORIDE 0.9% FLUSH: 3.0000 mL | INTRAVENOUS | Status: DC | PRN

## 2021-08-18 MED ORDER — HEPARIN SODIUM (PORCINE) 1000 UNIT/ML IJ SOLN
INTRAMUSCULAR | Status: AC
  Filled 2021-08-18: qty 10

## 2021-08-18 SURGICAL SUPPLY — 20 items
BALLN STERLING OTW 3X150X150 (BALLOONS) ×3
BALLOON STERLING OTW 3X150X150 (BALLOONS) IMPLANT
BAND CMPR LRG ZPHR (HEMOSTASIS) ×2
BAND ZEPHYR COMPRESS 30 LONG (HEMOSTASIS) ×1 IMPLANT
CATH CXI 2.3F 65 ANG 2 (CATHETERS) ×1 IMPLANT
CLOSURE MYNX CONTROL 5F (Vascular Products) ×1 IMPLANT
GLIDEWIRE NITREX 0.018X80X5 (WIRE) ×3
GUIDEWIRE NITREX 0.018X80X5 (WIRE) IMPLANT
GUIDEWIRE ZILIENT 6G 014 (WIRE) ×1 IMPLANT
KIT ENCORE 26 ADVANTAGE (KITS) ×1 IMPLANT
KIT MICROPUNCTURE NIT STIFF (SHEATH) ×2 IMPLANT
KIT PV (KITS) ×4 IMPLANT
MAT PREVALON FULL STRYKER (MISCELLANEOUS) ×1 IMPLANT
SHEATH GLIDE SLENDER 4/5FR (SHEATH) ×2 IMPLANT
SHEATH PINNACLE 5F 10CM (SHEATH) ×1 IMPLANT
STENT SYNERGY XD 3.50X38 (Permanent Stent) IMPLANT
SYNERGY XD 3.50X38 (Permanent Stent) ×3 IMPLANT
TRANSDUCER W/STOPCOCK (MISCELLANEOUS) ×4 IMPLANT
TRAY PV CATH (CUSTOM PROCEDURE TRAY) ×4 IMPLANT
WIRE BENTSON .035X145CM (WIRE) ×1 IMPLANT

## 2021-08-18 NOTE — Progress Notes (Signed)
Pt arrived back to room from cath lab. Left groin site level 0 with gauze dressing. PT and DP  pulses found with doppler. Pt educated on bedrest. Vitals being monitored per protocol. ?

## 2021-08-18 NOTE — Op Note (Addendum)
? ? ?  Patient name: [name] [name] MRN: [id number] DOB: 1950-09-17 Sex: male ? ?08/18/2021 ?Pre-operative Diagnosis: Critical right lower extremity ischemia with wounds ?Post-operative diagnosis:  Same ?Surgeon:  Eda Paschal. Donzetta Matters, MD ?Procedure Performed: ?1.  Ultrasound guided cannulation right common femoral artery ?2.  Fluoroscopic guided cannulation right peroneal artery ?3.  Stent of right tibioperoneal trunk and peroneal artery with 3.5 x 38 mm Synergy ?4.  Moderate sedation with fentanyl and Versed for 58 minutes ?5.  Mynx device closure right common femoral artery ? ?Indications: 71 year old male has undergone left femoral to popliteal artery bypass and now indicated for right lower extremity intervention for wounds. ? ?Wound is small ulcer with no gangrene, ischemia toe pressure of 74 and foot infection is mild with less than 2 cm of cellulitis ? ?Findings: Common femoral artery was free of disease.  Peroneal artery was the dominant runoff with an occluded TP trunk and proximal peroneal artery.  We are able to traverse the peroneal and tibioperoneal trunk in a retrograde fashion and then performed balloon angioplasty where there was some dissection noted in the TP trunk and we performed primary coronary artery stenting of the TP trunk into the peroneal artery which demonstrated no residual stenosis and no dissection and a systolic blood pressure was noted in the peroneal artery through the sheath of 140 mmHg which was similar to his blood pressure cuff at the brachial artery. ?  ?Procedure:  The patient was identified in the holding area and taken to room 8.  The patient was then placed supine on the table and prepped and draped in the usual sterile fashion.  A time out was called.  Ultrasound was used to evaluate the right common femoral artery.  There is no spasm percent lidocaine cannulated micropuncture needle followed by wire and sheath.  And images saved the permanent record.  Bentson wires placed followed  by 5 [place] sheath.  We then performed angiography of the distal ankle.  The area around the ankle was anesthetized with 1% lidocaine cannulated the peroneal artery using 3 different angulated views of the ankle and I was able to get a micropuncture needle into the peroneal artery and pass a wire proximally.  I then placed a micropuncture sheath followed by an 014 wire followed by a 5 French slender sheath.  The patient was then heparinized at this time.  I performed retrograde angiography and identified the occlusion.  I crossed retrograde with a 6 g 014 wire placed an 014 catheter confirmed crossing intraluminally.  I then primarily dilated with a 3 mm balloon at nominal pressure for 2 minutes.  Completion demonstrated residual dissection at the TP trunk which I then primarily stented with 3.5 x 38 mm drug-eluting coronary stent.  Completion demonstrated no residual dissection and after removing the stent and wire there was pressures through the slender sheath of 140 mmHg.  Satisfied with this we deployed a minx device in the right common femoral artery sheath.  A Zephyr band was placed at the ankle.  He tolerated procedure without immediate complication. ? ? ?Contrast: 90 cc ? ?Lizandra Zakrzewski C. Donzetta Matters, MD ?[facility] ?Office: [phone] ?Pager: [phone] ? ? ?

## 2021-08-18 NOTE — Progress Notes (Signed)
OT Cancellation Note ? ?Patient Details ?Name: DEMONTE DOBRATZ ?MRN: [id number] ?DOB: 20-Jun-1950 ? ? ?Cancelled Treatment:    Reason Eval/Treat Not Completed: Patient not medically ready (Currently on bedrest following procedure) ? ?Lodema Hong, OTA ?Acute Rehabilitation Services  ?Pager [phone] ?Office [phone] ? ? ?[place] ?08/18/2021, 2:45 PM ?

## 2021-08-18 NOTE — Progress Notes (Signed)
? ? ?  Subjective  - POD #3 ? ?Feeling better this am ? ? ?Physical Exam: ? ?Brisk left DP ?Incisions  clean ? ? ? ? ? ? ?Assessment/Plan:  POD #3 ? ?Plan for attempt at pedal access this am.  If unsuccessful, he will require surgical revascularization on Wednesday.  Anticoagulation on hold for now, will restart once revascularization is cmplete.  Can start Plavix today if successful today. ? ?Annamarie Major ?08/18/2021 ?8:26 AM ?-- ? ?Vitals:  ? 08/18/21 0351 08/18/21 0731  ?BP: 126/69 (!) 162/76  ?Pulse: 89 89  ?Resp: 18 19  ?Temp: 98.9 ?F (37.2 ?C) 98.2 ?F (36.8 ?C)  ?SpO2: 94% 95%  ? ? ?Intake/Output Summary (Last 24 hours) at 08/18/2021 0826 ?Last data filed at 08/18/2021 0947 ?Gross per 24 hour  ?Intake 200 ml  ?Output 3075 ml  ?Net -2875 ml  ? ? ? ?Laboratory ?CBC ?   ?Component Value Date/Time  ? WBC 9.3 08/17/2021 0134  ? HGB 10.1 (L) 08/17/2021 0134  ? HGB 13.7 08/11/2021 1011  ? HCT 32.5 (L) 08/17/2021 0134  ? HCT 43.1 08/11/2021 1011  ? PLT 227 08/17/2021 0134  ? PLT 323 08/11/2021 1011  ? ? ?BMET ?   ?Component Value Date/Time  ? NA 132 (L) 08/17/2021 0134  ? NA 137 08/11/2021 1011  ? K 4.4 08/17/2021 0134  ? CL 99 08/17/2021 0134  ? CO2 26 08/17/2021 0134  ? GLUCOSE 242 (H) 08/17/2021 0134  ? BUN 15 08/17/2021 0134  ? BUN 20 08/11/2021 1011  ? CREATININE 1.44 (H) 08/17/2021 0134  ? CALCIUM 8.8 (L) 08/17/2021 0134  ? GFRNONAA 52 (L) 08/17/2021 0134  ? GFRAA 54 (L) 06/20/2020 1529  ? ? ?COAG ?Lab Results  ?Component Value Date  ? INR 1.1 08/15/2021  ? INR 1.1 11/20/2020  ? INR 1.3 (H) 05/23/2020  ? ?No results found for: PTT ? ?Antibiotics ?Anti-infectives (From admission, onward)  ? ? Start     Dose/Rate Route Frequency Ordered Stop  ? 08/16/21 0245  ceFAZolin (ANCEF) IVPB 2g/100 mL premix       ? 2 g ?200 mL/hr over 30 Minutes Intravenous Every 8 hours 08/16/21 0151 08/16/21 1141  ? 08/15/21 0927  ceFAZolin (ANCEF) IVPB 3g/100 mL premix       ? 3 g ?200 mL/hr over 30 Minutes Intravenous 30 min pre-op 08/15/21  0962 08/15/21 2326  ? ?  ? ? ? ?V. Leia Alf, M.D., FACS ?[facility] [place] ?Office: [phone] ?Pager:  [phone]  ?

## 2021-08-18 NOTE — Progress Notes (Signed)
PT Cancellation Note ? ?Patient Details ?Name: [name] [name] ?MRN: [id number] ?DOB: 1951/02/05 ? ? ?Cancelled Treatment:    Reason Eval/Treat Not Completed: (P) Patient not medically ready;Patient at procedure or test/unavailable (pt off unit in AM for procedure with vascular surgery. In PM, pt remains on bed rest with TR band on, not yet cleared for mobility at this time.) ? ? ?[name] ?08/18/2021, 2:45 PM ? ? ?

## 2021-08-19 LAB — GLUCOSE, CAPILLARY
Glucose-Capillary: 287 mg/dL — ABNORMAL HIGH (ref 70–99)
Glucose-Capillary: 225 mg/dL — ABNORMAL HIGH (ref 70–99)
Glucose-Capillary: 202 mg/dL — ABNORMAL HIGH (ref 70–99)
Glucose-Capillary: 135 mg/dL — ABNORMAL HIGH (ref 70–99)

## 2021-08-19 LAB — BASIC METABOLIC PANEL
Anion gap: 9 (ref 5–15)
BUN: 14 mg/dL (ref 8–23)
CO2: 28 mmol/L (ref 22–32)
Calcium: 9.5 mg/dL (ref 8.9–10.3)
Chloride: 96 mmol/L — ABNORMAL LOW (ref 98–111)
Creatinine, Ser: 1.3 mg/dL — ABNORMAL HIGH (ref 0.61–1.24)
GFR, Estimated: 59 mL/min — ABNORMAL LOW (ref >60)
Glucose, Bld: 261 mg/dL — ABNORMAL HIGH (ref 70–99)
Potassium: 4.8 mmol/L (ref 3.5–5.1)
Sodium: 133 mmol/L — ABNORMAL LOW (ref 135–145)

## 2021-08-19 LAB — LIPID PANEL
Cholesterol: 167 mg/dL (ref 0–200)
HDL: 23 mg/dL — ABNORMAL LOW (ref >40)
LDL Cholesterol: 90 mg/dL (ref 0–99)
Total CHOL/HDL Ratio: 7.3 RATIO
Triglycerides: 270 mg/dL — ABNORMAL HIGH (ref <150)
VLDL: 54 mg/dL — ABNORMAL HIGH (ref 0–40)

## 2021-08-19 LAB — CBC
HCT: 36.6 % — ABNORMAL LOW (ref 39.0–52.0)
Hemoglobin: 11.3 g/dL — ABNORMAL LOW (ref 13.0–17.0)
MCH: 26.7 pg (ref 26.0–34.0)
MCHC: 30.9 g/dL (ref 30.0–36.0)
MCV: 86.3 fL (ref 80.0–100.0)
Platelets: 274 10*3/uL (ref 150–400)
RBC: 4.24 MIL/uL (ref 4.22–5.81)
RDW: 19.2 % — ABNORMAL HIGH (ref 11.5–15.5)
WBC: 8.6 10*3/uL (ref 4.0–10.5)
nRBC: 0 % (ref 0.0–0.2)

## 2021-08-19 MED ORDER — APIXABAN 5 MG PO TABS
5.0000 mg | ORAL_TABLET | Freq: Two times a day (BID) | ORAL | Status: DC
  Administered 2021-08-19 – 2021-08-20 (×3): 5 mg via ORAL
  Filled 2021-08-19 (×3): qty 1

## 2021-08-19 MED ORDER — INSULIN ASPART PROT & ASPART (70-30 MIX) 100 UNIT/ML ~~LOC~~ SUSP
60.0000 [IU] | Freq: Two times a day (BID) | SUBCUTANEOUS | Status: DC
Start: 2021-08-19 — End: 2021-08-20
  Administered 2021-08-19 – 2021-08-20 (×2): 60 [IU] via SUBCUTANEOUS
  Filled 2021-08-19: qty 10

## 2021-08-19 NOTE — Progress Notes (Signed)
Inpatient Diabetes Program Recommendations ? ?AACE/ADA: New Consensus Statement on Inpatient Glycemic Control (2015) ? ?Target Ranges:  Prepandial:   less than 140 mg/dL ?     Peak postprandial:   less than 180 mg/dL (1-2 hours) ?     Critically ill patients:  140 - 180 mg/dL  ? ?Lab Results  ?Component Value Date  ? GLUCAP 225 (H) 08/19/2021  ? HGBA1C 7.5 (H) 08/11/2021  ? ? ?Review of Glycemic Control ? Latest Reference Range & Units 08/18/21 16:09 08/18/21 21:06 08/19/21 06:10 08/19/21 11:34  ?Glucose-Capillary 70 - 99 mg/dL 214 (H) 250 (H) 287 (H) 225 (H)  ?(H): Data is abnormally high ? ?Diabetes history: DM2 ?Outpatient Diabetes medications: 70/30 60 units BID, Humalog 15-20 units TID, Farxiga 5 mg QD, Mounjaro 5 mg every Sunday,  ?Current orders for Inpatient glycemic control: Novolog 0-15 units TID ? ?Inpatient Diabetes Program Recommendations:   ? ?Semglee 20 units QD ? ?Will continue to follow while inpatient. ? ?Thank you, ?Reche Dixon, MSN, RN ?Diabetes Coordinator ?Inpatient Diabetes Program ?[phone] (team pager from 8a-5p) ? ? ? ?

## 2021-08-19 NOTE — Progress Notes (Addendum)
Physical Therapy Treatment ?Patient Details ?Name: [name] [name] ?MRN: [id number] ?DOB: 1951-03-21 ?Today's Date: 08/19/2021 ? ? ?History of Present Illness 71 year old male admitted 08/15/21 with bil LE ulcers. Pt with failed left fem popliteal BPG for redo graft and stent completed 4/1. 4/3 Rt femoral artery cannulation with tibioperoneal stent. PMH:  PAF, HTN, HLD, DM, CVA, PAD, CAD with [place] ? ?  ?PT Comments  ? ? Pt reports he is eager to return home and has assist of wife at D/C as well as DME. Pt able to walk in hall but could not ascend stairs at this time due to pain. Pt educated for HEP and able to perform. Pt educated or need to manage medication to allow for progressive activity and premedicate prior to gait. Pt reports feeling comfortable with being able to return home at this level and educated for progressive gait and HEp should he D/C today. Will continue to follow.  ?   ?Recommendations for follow up therapy are one component of a multi-disciplinary discharge planning process, led by the attending physician.  Recommendations may be updated based on patient status, additional functional criteria and insurance authorization. ? ?Follow Up Recommendations ? Home health PT ?  ?  ?Assistance Recommended at Discharge Intermittent Supervision/Assistance  ?Patient can return home with the following Assistance with cooking/housework;Assist for transportation;Help with stairs or ramp for entrance;A little help with bathing/dressing/bathroom;A little help with walking and/or transfers ?  ?Equipment Recommendations ? BSC/3in1  ?  ?Recommendations for Other Services   ? ? ?  ?Precautions / Restrictions Precautions ?Precautions: Fall ?Precaution Comments: wounds, decreased skin integrity to B LE's, watch HR ?Restrictions ?Weight Bearing Restrictions: No  ?  ? ?Mobility ? Bed Mobility ?  ?  ?  ?  ?  ?  ?  ?General bed mobility comments: in chair on arrival and end of session ?  ? ?Transfers ?Overall transfer level: Needs  assistance ?  ?Transfers: Sit to/from Stand ?Sit to Stand: Supervision ?  ?  ?  ?  ?  ?General transfer comment: supervision to rise from chair with and without armrests ?  ? ?Ambulation/Gait ?Ambulation/Gait assistance: Min guard ?Gait Distance (Feet): 60 Feet ?Assistive device: Rolling walker (2 wheels) ?Gait Pattern/deviations: Step-through pattern, Decreased stride length, Decreased stance time - left, Antalgic ?  ?Gait velocity interpretation: <1.8 ft/sec, indicate of risk for recurrent falls ?  ?General Gait Details: pt able to walk to end of hall but then stated too much pain and required chair pulled to him for ride back to room with pt unable to complete stairs. Pt with antalgic gait and reliance on RW for support ? ? ?Stairs ?  ?  ?  ?  ?  ? ? ?Wheelchair Mobility ?  ? ?Modified Rankin (Stroke Patients Only) ?  ? ? ?  ?Balance Overall balance assessment: Needs assistance ?  ?Sitting balance-Leahy Scale: Fair ?  ?  ?Standing balance support: Bilateral upper extremity supported, Reliant on assistive device for balance ?Standing balance-Leahy Scale: Poor ?Standing balance comment: reliant on RW for support ?  ?  ?  ?  ?  ?  ?  ?  ?  ?  ?  ?  ? ?  ?Cognition Arousal/Alertness: Awake/alert ?Behavior During Therapy: [facility] for tasks assessed/performed ?Overall Cognitive Status: Within Functional Limits for tasks assessed ?  ?  ?  ?  ?  ?  ?  ?  ?  ?  ?  ?  ?  ?  ?  ?  ?  ?  ?  ? ?  ?  Exercises General Exercises - Lower Extremity ?Long Arc Quad: AROM, Both, Seated, 10 reps ?Hip Flexion/Marching: AROM, Both, 10 reps, Seated ? ?  ?General Comments   ?  ?  ? ?Pertinent Vitals/Pain Pain Assessment ?Pain Score: 8  ?Pain Location: left groin ?Pain Descriptors / Indicators: Discomfort, Grimacing, Guarding ?Pain Intervention(s): Limited activity within patient's tolerance, Monitored during session, Repositioned, Premedicated before session  ? ? ?Home Living   ?  ?  ?  ?  ?  ?  ?  ?  ?  ?   ?  ?Prior Function    ?  ?  ?    ? ?PT Goals (current goals can now be found in the care plan section) Progress towards PT goals: Progressing toward goals ? ?  ?Frequency ? ? ? Min 3X/week ? ? ? ?  ?PT Plan Current plan remains appropriate  ? ? ?Co-evaluation   ?  ?  ?  ?  ? ?  ?AM-PAC PT "6 Clicks" Mobility   ?Outcome Measure ? Help needed turning from your back to your side while in a flat bed without using bedrails?: A Little ?Help needed moving from lying on your back to sitting on the side of a flat bed without using bedrails?: A Little ?Help needed moving to and from a bed to a chair (including a wheelchair)?: A Little ?Help needed standing up from a chair using your arms (e.g., wheelchair or bedside chair)?: A Little ?Help needed to walk in hospital room?: A Little ?Help needed climbing 3-5 steps with a railing? : Total ?6 Click Score: 16 ? ?  ?End of Session Equipment Utilized During Treatment: Gait belt ?Activity Tolerance: Patient limited by pain ?Patient left: in chair;with call bell/phone within reach;with family/visitor present ?Nurse Communication: Mobility status ?PT Visit Diagnosis: Unsteadiness on feet (R26.81);Muscle weakness (generalized) (M62.81);Difficulty in walking, not elsewhere classified (R26.2);Pain ?  ? ? ?Time: 1010-1053 ?PT Time Calculation (min) (ACUTE ONLY): 43 min ? ?Charges:  $Gait Training: 8-22 mins ?$Therapeutic Exercise: 8-22 mins ?$Therapeutic Activity: 8-22 mins          ?          ? ?Sandia Pfund P, PT ?Acute Rehabilitation Services ?Pager: [phone] ?Office: [phone] ? ? ? ?Irlene Crudup B Darlisha Kelm ?08/19/2021, 11:43 AM ? ?

## 2021-08-19 NOTE — Progress Notes (Addendum)
?  Progress Note ? ? ? ?08/19/2021 ?7:27 AM ?1 Day Post-Op ? ?Subjective:  R foot feels better today ? ? ?Vitals:  ? 08/18/21 2323 08/19/21 0421  ?BP: 140/77 (!) 150/76  ?Pulse: 100 100  ?Resp: 18 17  ?Temp: 98.9 ?F (37.2 ?C) 99 ?F (37.2 ?C)  ?SpO2: 98% 99%  ? ?Physical Exam: ?Lungs:  non labored ?Incisions:  L groin incisional vac with good seal; L popliteal incision healing well; R groin and ankle cath site without hematoma ?Extremities:  brisk R DP and PT by doppler; brisk L DP and PT by doppler ?Neurologic: A&O ? ?CBC ?   ?Component Value Date/Time  ? WBC 8.6 08/19/2021 0332  ? RBC 4.24 08/19/2021 0332  ? HGB 11.3 (L) 08/19/2021 0332  ? HGB 13.7 08/11/2021 1011  ? HCT 36.6 (L) 08/19/2021 0332  ? HCT 43.1 08/11/2021 1011  ? PLT 274 08/19/2021 0332  ? PLT 323 08/11/2021 1011  ? MCV 86.3 08/19/2021 0332  ? MCV 83 08/11/2021 1011  ? MCH 26.7 08/19/2021 0332  ? MCHC 30.9 08/19/2021 0332  ? RDW 19.2 (H) 08/19/2021 0332  ? RDW 21.4 (H) 08/11/2021 1011  ? LYMPHSABS 1.6 08/11/2021 1011  ? MONOABS 0.8 07/25/2021 1359  ? EOSABS 0.2 08/11/2021 1011  ? BASOSABS 0.1 08/11/2021 1011  ? ? ?BMET ?   ?Component Value Date/Time  ? NA 133 (L) 08/19/2021 0332  ? NA 137 08/11/2021 1011  ? K 4.8 08/19/2021 0332  ? CL 96 (L) 08/19/2021 0332  ? CO2 28 08/19/2021 0332  ? GLUCOSE 261 (H) 08/19/2021 0332  ? BUN 14 08/19/2021 0332  ? BUN 20 08/11/2021 1011  ? CREATININE 1.30 (H) 08/19/2021 0332  ? CALCIUM 9.5 08/19/2021 0332  ? GFRNONAA 59 (L) 08/19/2021 0332  ? GFRAA 54 (L) 06/20/2020 1529  ? ? ?INR ?   ?Component Value Date/Time  ? INR 1.1 08/15/2021 1023  ? ? ? ?Intake/Output Summary (Last 24 hours) at 08/19/2021 0727 ?Last data filed at 08/19/2021 2563 ?Gross per 24 hour  ?Intake 751.22 ml  ?Output 2150 ml  ?Net -1398.78 ml  ? ? ? ?Assessment/Plan:  71 y.o. male is s/p redo L femoral to popliteal bypass with PTFE and R TP trunk and peroneal artery stenting 1 Day Post-Op  ? ?BLE well perfused based on doppler exam ?Toe wounds of BLE are dry  without sign of infection ?Plavix and Eliquis restarted ?OOB with therapy today; Consult TOC for [facility] PT  ?Home this afternoon or tomorrow ? ? ?Dagoberto Ligas, PA-C ?[facility] ?[phone] ?08/19/2021 ?7:27 AM ? ?I have independently interviewed and examined patient and agree with PA assessment and plan above. Working with PT/OT, home with preveena when ambulating safely. ? ?Alexander Aument C. Donzetta Matters, MD ?[facility] ?Office: [phone] ?Pager: [phone] ? ? ?

## 2021-08-19 NOTE — Progress Notes (Signed)
Occupational Therapy Treatment ?Patient Details ?Name: [name] [name] ?MRN: [id number] ?DOB: 06-01-1950 ?Today's Date: 08/19/2021 ? ? ?History of present illness 71 year old male admitted 08/15/21 with bil LE ulcers. Pt with failed left fem popliteal BPG for redo graft and stent completed 4/1. 4/3 Rt femoral artery cannulation with tibioperoneal stent. PMH:  PAF, HTN, HLD, DM, CVA, PAD, CAD with [place] ?  ?OT comments ? Patient making good progress with OT treatment and states he has less pain. Patient required increased time for all tasks and frequent rest breaks due to fatigue and weakness. Patient determined to perform tasks without assistance, requiring extra time to perform. Patient is requesting to discharge home today.  Acute OT to continue to follow while in this setting.   ? ?Recommendations for follow up therapy are one component of a multi-disciplinary discharge planning process, led by the attending physician.  Recommendations may be updated based on patient status, additional functional criteria and insurance authorization. ?   ?Follow Up Recommendations ? No OT follow up  ?  ?Assistance Recommended at Discharge Intermittent Supervision/Assistance  ?Patient can return home with the following ? A little help with walking and/or transfers;A little help with bathing/dressing/bathroom;Assist for transportation;Help with stairs or ramp for entrance ?  ?Equipment Recommendations ? BSC/3in1  ?  ?Recommendations for Other Services   ? ?  ?Precautions / Restrictions Precautions ?Precautions: Fall ?Precaution Comments: wounds, decreased skin integrity to B LE's, watch HR ?Restrictions ?Weight Bearing Restrictions: No  ? ? ?  ? ?Mobility Bed Mobility ?Overal bed mobility: Needs Assistance ?Bed Mobility: Supine to Sit ?  ?  ?Supine to sit: Min guard, HOB elevated ?  ?  ?General bed mobility comments: increased time to perform with min guard to guide LEs off bed ?  ? ?Transfers ?Overall transfer level: Needs  assistance ?Equipment used: Rolling walker (2 wheels) ?Transfers: Sit to/from Stand ?Sit to Stand: Min guard, From elevated surface ?  ?  ?  ?  ?  ?General transfer comment: required several attempts to power up without assistance, patient wanted to attempt without assitance.  Min guard for safety ?  ?  ?Balance Overall balance assessment: Needs assistance ?Sitting-balance support: Feet supported, Single extremity supported, Bilateral upper extremity supported ?Sitting balance-Leahy Scale: Fair ?Sitting balance - Comments: able to sit on EOB without assistance ?  ?Standing balance support: Bilateral upper extremity supported, Reliant on assistive device for balance ?Standing balance-Leahy Scale: Poor ?Standing balance comment: reliant on RW for support ?  ?  ?  ?  ?  ?  ?  ?  ?  ?  ?  ?   ? ?ADL either performed or assessed with clinical judgement  ? ?ADL Overall ADL's : Needs assistance/impaired ?  ?  ?Grooming: Set up;Sitting ?Grooming Details (indicate cue type and reason): at sink in recliner ?  ?  ?  ?  ?  ?  ?Lower Body Dressing: Maximal assistance;Sitting/lateral leans ?Lower Body Dressing Details (indicate cue type and reason): required assistance to donn socks ?Toilet Transfer: Minimal assistance;BSC/3in1 ?Toilet Transfer Details (indicate cue type and reason): step pivot transfer from recliner ?  ?  ?  ?  ?  ?General ADL Comments: Patient required increased time to perform tasks ?  ? ?Extremity/Trunk Assessment   ?  ?  ?  ?  ?  ? ?Vision   ?  ?  ?Perception   ?  ?Praxis   ?  ? ?Cognition Arousal/Alertness: Awake/alert ?Behavior During Therapy: [facility] for tasks assessed/performed ?  Overall Cognitive Status: Within Functional Limits for tasks assessed ?  ?  ?  ?  ?  ?  ?  ?  ?  ?  ?  ?  ?  ?  ?  ?  ?General Comments: pleasant and eager to return home ?  ?  ?   ?Exercises   ? ?  ?Shoulder Instructions   ? ? ?  ?General Comments    ? ? ?Pertinent Vitals/ Pain       Pain Assessment ?Pain Assessment: 0-10 ?Pain  Score: 2  ?Faces Pain Scale: Hurts a little bit ?Pain Location: L leg and L scrotum ?Pain Descriptors / Indicators: Discomfort, Grimacing, Guarding ?Pain Intervention(s): Limited activity within patient's tolerance, Monitored during session, Repositioned ? ?Home Living   ?  ?  ?  ?  ?  ?  ?  ?  ?  ?  ?  ?  ?  ?  ?  ?  ?  ?  ? ?  ?Prior Functioning/Environment    ?  ?  ?  ?   ? ?Frequency ? Min 2X/week  ? ? ? ? ?  ?Progress Toward Goals ? ?OT Goals(current goals can now be found in the care plan section) ? Progress towards OT goals: Progressing toward goals ? ?Acute Rehab OT Goals ?Patient Stated Goal: go home ?OT Goal Formulation: With patient ?Time For Goal Achievement: 08/30/21 ?Potential to Achieve Goals: Good ?ADL Goals ?Pt Will Perform Grooming: with modified independence;standing ?Pt Will Perform Upper Body Dressing: with modified independence;sitting ?Pt Will Perform Lower Body Dressing: with min assist;sit to/from stand ?Pt Will Transfer to Toilet: with modified independence;ambulating ?Pt Will Perform Toileting - Clothing Manipulation and hygiene: with modified independence;sit to/from stand ?Additional ADL Goal #1: Pt will demonstrate increased activity tolerance by completing at least 3 ADL tasks in standing independently  ?Plan Discharge plan remains appropriate   ? ?Co-evaluation ? ? ?   ?  ?  ?  ?  ? ?  ?AM-PAC OT "6 Clicks" Daily Activity     ?Outcome Measure ? ? Help from another person eating meals?: None ?Help from another person taking care of personal grooming?: A Little ?Help from another person toileting, which includes using toliet, bedpan, or urinal?: A Little ?Help from another person bathing (including washing, rinsing, drying)?: A Lot ?Help from another person to put on and taking off regular upper body clothing?: A Little ?Help from another person to put on and taking off regular lower body clothing?: A Lot ?6 Click Score: 17 ? ?  ?End of Session Equipment Utilized During Treatment:  Rolling walker (2 wheels) ? ?OT Visit Diagnosis: Unsteadiness on feet (R26.81);Other abnormalities of gait and mobility (R26.89);Muscle weakness (generalized) (M62.81);Pain ?Pain - Right/Left: Left ?Pain - part of body: Leg ?  ?Activity Tolerance Patient tolerated treatment well ?  ?Patient Left in chair;with call bell/phone within reach;with family/visitor present ?  ?Nurse Communication Mobility status ?  ? ?   ? ?Time: 1791-5056 ?OT Time Calculation (min): 53 min ? ?Charges: OT General Charges ?$OT Visit: 1 Visit ?OT Treatments ?$Self Care/Home Management : 53-67 mins ? ?Lodema Hong, OTA ?Acute Rehabilitation Services  ?Pager [phone] ?Office [phone] ? ? ?Natalia ?08/19/2021, 9:39 AM ?

## 2021-08-19 NOTE — Care Management Important Message (Signed)
Important Message ? ?Patient Details  ?Name: [name] [name] ?MRN: [id number] ?Date of Birth: 12-21-1950 ? ? ?Medicare Important Message Given:  Yes ? ? ? ? ?[name] ?08/19/2021, 8:09 AM ?

## 2021-08-19 NOTE — TOC Initial Note (Addendum)
Transition of Care (TOC) - Initial/Assessment Note  ?Marvetta Gibbons Therapist, sports, BSN ?Transitions of Care ?Unit 4E- RN Case Manager ?See Treatment Team for direct phone #  ? ? ?Patient Details  ?Name: [name] [name] ?MRN: [id number] ?Date of Birth: 07-Sep-1950 ? ?Transition of Care (TOC) CM/SW Contact:    ?Dahlia Client, Romeo Rabon, RN ?Phone Number: ?08/19/2021, 2:40 PM ? ?Clinical Narrative:                 ?Pt from home w/ wife. Noted order for HHPT and recommendation for DME-3n1- CM in to speak with pt and wife at bedside.  ?Per discussion on HH and DME- pt does want 3n1 for home, has other DME including RW, cane and shower chair, pt agreeable to use in house provider- will request DME order.  ? ?List provided for [facility] choice Per CMS guidelines from medicare.gov website with star ratings (copy placed in shadow chart), pt was doing outpt PT but for now agreeable to [facility] services. After review of list - wife states they have used [facility] before and would like to see if they can services again, second choice would be [facility].  ? ?Address, phone # confirmed. Wife also asking if nursing can be added to watch wound on foot. Explained that CM will ask about nursing to see if available - but can not guarantee.  ? ?Call made to [facility] with [facility] for HHPT referral- referral pending at this time to check on staffing availability.  ?1500- per Lattie Haw they can accept- however have a delay in start of care due to Easter week low staffing and can not do start of care until week of April 17- discussed with pt and wife- pt states he is ok with delay in start of care and prefers to stay with [facility] for [facility] services. Lattie Haw informed of pt's choice to stay with them with known delay for start of care- per Lattie Haw they will plan to do start of care with HHPT and can add [facility] after initial visit if needed.  ? ?Call made to [facility] for DME need- 3n1 to be delivered to room prior to discharge.   ? ?Expected Discharge Plan: [place] ?Barriers to Discharge: Continued Medical Work up ? ? ?Patient Goals and CMS Choice ?Patient states their goals for this hospitalization and ongoing recovery are:: return home ?CMS Medicare.gov Compare Post Acute Care list provided to:: Patient ?Choice offered to / list presented to : Patient, Spouse ? ?Expected Discharge Plan and Services ?Expected Discharge Plan: [place] ?  ?Discharge Planning Services: CM Consult ?Post Acute Care Choice: Home Health, Durable Medical Equipment ?Living arrangements for the past 2 months: [place] ?                ?DME Arranged: 3-N-1 ?DME Agency: [facility] ?Date DME Agency Contacted: 08/19/21 ?Time DME Agency Contacted: 1440 ?Representative spoke with at DME Agency: sheila ?HH Arranged: PT ?[place] Agency: [place] ?Date HH Agency Contacted: 08/19/21 ?Time [place]: 1440 ?Representative spoke with at [place]: Lattie Haw ? ?Prior Living Arrangements/Services ?Living arrangements for the past 2 months: [place] ?Lives with:: Spouse ?Patient language and need for interpreter reviewed:: Yes ?Do you feel safe going back to the place where you live?: Yes      ?Need for Family Participation in Patient Care: Yes (Comment) ?Care giver support system in place?: Yes (comment) ?Current home services: DME (cane, walker, shower chair) ?  ? ?  Activities of Daily Living ?Home Assistive Devices/Equipment: CBG Meter ?ADL Screening (condition at time of admission) ?Patient's cognitive ability adequate to safely complete daily activities?: Yes ?Is the patient deaf or have difficulty hearing?: No ?Does the patient have difficulty seeing, even when wearing glasses/contacts?: No ?Does the patient have difficulty concentrating, remembering, or making decisions?: No ?Patient able to express need for assistance with ADLs?: Yes ?Does the patient have difficulty dressing or bathing?: No ?Independently performs ADLs?: Yes (appropriate for  developmental age) ?Does the patient have difficulty walking or climbing stairs?: Yes ?Weakness of Legs: Both ?Weakness of Arms/Hands: None ? ?Permission Sought/Granted ?Permission sought to share information with : Customer service manager ?Permission granted to share information with : Yes, Verbal Permission Granted ?   ? Permission granted to share info w AGENCY: HH/DME ?   ?   ? ?Emotional Assessment ?Appearance:: Appears stated age ?Attitude/Demeanor/Rapport: Engaged ?Affect (typically observed): Accepting, Appropriate ?Orientation: : Oriented to Self, Oriented to Place, Oriented to  Time, Oriented to Situation ?Alcohol / Substance Use: Not Applicable ?Psych Involvement: No (comment) ? ?Admission diagnosis:  Atherosclerosis of arteries of extremities ([place]) [I70.209] ?Critical lower limb ischemia ([place]) [I70.229] ?Patient Active Problem List  ? Diagnosis Date Noted  ? Critical lower limb ischemia ([place]) 08/16/2021  ? Atherosclerosis of arteries of extremities ([place]) 08/15/2021  ? Coronary artery disease of native artery of native heart with stable angina pectoris ([place])   ? Leg wound, left 07/25/2021  ? S/P femoral-popliteal bypass surgery 11/22/2020  ? PAOD (peripheral arterial occlusive disease) ([place]) 11/22/2020  ? Non-ST elevation (NSTEMI) myocardial infarction ([place]) 05/23/2020  ? Orthostatic hypotension 01/01/2020  ? Statin myopathy 12/06/2019  ? Diarrhea 05/10/2018  ? PAF (paroxysmal atrial fibrillation) ([place]) 03/29/2018  ? Lacunar infarction ([place]) 03/29/2018  ? Unstable angina (HCC)   ? Chest pain 01/18/2018  ? CHF (congestive heart failure) ([place]) 10/21/2017  ? Anxiety 03/09/2017  ? Constipation 11/26/2016  ? History of non-ST elevation myocardial infarction (NSTEMI) 07/29/2016  ? Diabetes ([place]) 05/25/2016  ? Occlusion and stenosis of vertebral artery   ? Acute renal failure ([place]) 05/23/2016  ? Diabetic neuropathy ([place]) 01/13/2016  ? Depression 11/01/2015  ? Erectile dysfunction 01/23/2015  ? Morbid  obesity ([place]) 01/23/2015  ? Periodontal disease 01/23/2015  ? Stopped smoking with greater than 30 pack year history 01/23/2015  ? Diabetic peripheral neuropathy associated with type 2 diabetes mellitus ([place]) 11/08/2014  ? Mucosal abnormality of stomach   ? History of colonic polyps   ? GERD (gastroesophageal reflux disease) 09/06/2014  ? Low serum testosterone level 08/01/2014  ? DDD (degenerative disc disease), cervical 12/20/2013  ? Headache 08/07/2013  ? Medically noncompliant 10/25/2011  ? CAD S/P percutaneous coronary angioplasty 10/25/2011  ? MURMUR 04/16/2009  ? Hypertension associated with diabetes ([place]) 08/02/2008  ? Hyperlipidemia associated with type 2 diabetes mellitus ([place]) 07/31/2008  ? DISORDERS OF IRON METABOLISM 07/31/2008  ? ?PCP:  [name], FNP ?Pharmacy:   ?[place], [place] ?[place] ?[facility] [place] [address] ?Phone: [phone] Fax: [phone] ? ?[place], [place] ?[address] ?[address] ?[place] [address] ?Phone: [phone] Fax: [phone] ? ?[place] [address] ?Phone: [phone] Fax: [phone] ? ?[facility] ?[address] ?[name] [address] ?Phone: [phone] Fax: [phone] ? ? ? ? ?Social Determinants  of Health (SDOH) Interventions ?  ? ?Readmission Risk Interventions ? ?  11/25/2020  ?  1:52 PM  ?Readmission Risk Prevention Plan  ?Transportation Screening Complete  ?PCP or Specialist Appt within 5-7 Days Complete  ?Home Care Screening Complete  ?Medication Review (RN CM) Complete  ? ? ? ?

## 2021-08-20 LAB — GLUCOSE, CAPILLARY
Glucose-Capillary: 170 mg/dL — ABNORMAL HIGH (ref 70–99)
Glucose-Capillary: 201 mg/dL — ABNORMAL HIGH (ref 70–99)

## 2021-08-20 MED ORDER — HYDROCODONE-ACETAMINOPHEN 10-325 MG PO TABS: 1.0000 | ORAL_TABLET | ORAL | 0 refills | Status: DC | PRN

## 2021-08-20 MED ORDER — ASPIRIN EC 81 MG PO TBEC: 81.0000 mg | DELAYED_RELEASE_TABLET | Freq: Every day | ORAL | 2 refills | Status: DC

## 2021-08-20 NOTE — Progress Notes (Signed)
Patient discharging home with wife. IV removed without complications. Tele removed and CCMD notified. Discharge instructions given and medication administration discussed. Wound care supplies provided for home use. All questions answered.  ?[place] C Leroi Haque  ?

## 2021-08-20 NOTE — TOC Transition Note (Signed)
Transition of Care (TOC) - CM/SW Discharge Note ?Marvetta Gibbons Therapist, sports, BSN ?Transitions of Care ?Unit 4E- RN Case Manager ?See Treatment Team for direct phone #  ? ? ?Patient Details  ?Name: STEEL KERNEY ?MRN: [id number] ?Date of Birth: 05/07/1951 ? ?Transition of Care (TOC) CM/SW Contact:  ?Dahlia Client, Romeo Rabon, RN ?Phone Number: ?08/20/2021, 10:36 AM ? ? ?Clinical Narrative:    ?Pt stable for transition home today, HHPT set up with [facility]- pt aware and agreeable to delay in start of care due to [facility] next week- [facility] will plan to see pt week of April 17.  ?[facility] to have called pt regarding 3n1- Medicare coverage- pt will need to pay out of pocket as he does not qualify for DME coverage- cost $38.  ? ? ?Final next level of care: [place] ?Barriers to Discharge: Barriers Resolved ? ? ?Patient Goals and CMS Choice ?Patient states their goals for this hospitalization and ongoing recovery are:: return home ?CMS Medicare.gov Compare Post Acute Care list provided to:: Patient ?Choice offered to / list presented to : Patient, Spouse ? ?Discharge Placement ?  ?           ?  ? Home w/ HH ?  ?  ? ?Discharge Plan and Services ?  ?Discharge Planning Services: CM Consult ?Post Acute Care Choice: Home Health, Durable Medical Equipment          ?DME Arranged: 3-N-1 ?DME Agency: [facility] ?Date DME Agency Contacted: 08/19/21 ?Time DME Agency Contacted: 1440 ?Representative spoke with at DME Agency: sheila ?HH Arranged: PT ?[place] Agency: [facility] ?Date HH Agency Contacted: 08/19/21 ?Time [place]: 1440 ?Representative spoke with at [place]: Lattie Haw ? ?Social Determinants of Health (SDOH) Interventions ?  ? ? ?Readmission Risk Interventions ? ?  08/20/2021  ? 10:36 AM 11/25/2020  ?  1:52 PM  ?Readmission Risk Prevention Plan  ?Transportation Screening Complete Complete  ?PCP or Specialist Appt within 5-7 Days Complete Complete  ?Home Care Screening Complete Complete  ?Medication Review (RN  CM) Complete Complete  ? ? ? ? ? ?

## 2021-08-20 NOTE — Consult Note (Signed)
[facility] CM Inpatient Consult ? ? ?08/20/2021 ? ?[name] [name] ?02/07/1951 ?[id number] ? ?[place] [facility] CM) ?  ?Primary Care Provider is an embedded provider with a Chronic Care Management team and program, and is listed for the transition of care follow up and appointments.  ?  ?Patient was reviewed for less than 30 days readmission. ? ?Patient will receive post hospital follow up with transition of care at primary provider office. ? ?Of note, [facility] Care Management services does not replace or interfere with any services that are arranged by inpatient case management or social work.  ? ?Netta Cedars, MSN, RN ?[facility] Liaison ?Phone [phone] ?Toll free office [phone]  ?

## 2021-08-20 NOTE — Consult Note (Signed)
? ?  [facility] CM Inpatient Consult ? ? ?08/20/2021 ? ?[name] [name] ?[birth year] ?[id number] ? ?Follow up: [employer] Embedded Provider confirmed by patient ? ?Primary Care Provider:  [name], FNP is the provider at [place] ? ? ?Met with patient and wife at bedside explained follow up appointment card and 24 hour nurse advise line for post hospital follow up management. ? ?For questions, ? ?Natividad Brood, RN BSN CCM ?[facility] Liaison ? [phone] business mobile phone ?Toll free office [phone]  ?Fax number: [phone] ?[email or web address] ?[email or web address] ? ? ?

## 2021-08-20 NOTE — Discharge Instructions (Signed)
Remove vacuum dressing from left groin like a bandaid on Saturday 08/23/21 ? ? ? ? ? ?[facility] ? ?Discharge instructions ? ?Lower Extremity Bypass Surgery ? ?Please refer to the following instruction for your post-procedure care. Your surgeon or physician assistant will discuss any changes with you. ? ?Activity ? ?You are encouraged to walk as much as you can. You can slowly return to normal activities during the month after your surgery. Avoid strenuous activity and heavy lifting until your doctor tells you it's OK. Avoid activities such as vacuuming or swinging a golf club. Do not drive until your doctor give the OK and you are no longer taking prescription pain medications. It is also normal to have difficulty with sleep habits, eating and bowel movement after surgery. These will go away with time. ? ?Bathing/Showering ? ?You may shower after you go home. Do not soak in a bathtub, hot tub, or swim until the incision heals completely. ? ?Incision Care ? ?Clean your incision with mild soap and water. Shower every day. Pat the area dry with a clean towel. You do not need a bandage unless otherwise instructed. Do not apply any ointments or creams to your incision. If you have open wounds you will be instructed how to care for them or a visiting nurse may be arranged for you. If you have staples or sutures along your incision they will be removed at your post-op appointment. You may have skin glue on your incision. Do not peel it off. It will come off on its own in about one week. If you have a great deal of moisture in your groin, use a gauze help keep this area dry. ? ?Diet ? ?Resume your normal diet. There are no special food restrictions following this procedure. A low fat/ low cholesterol diet is recommended for all patients with vascular disease. In order to heal from your surgery, it is CRITICAL to get adequate nutrition. Your body requires vitamins, minerals, and protein.  Vegetables are the best source of vitamins and minerals. Vegetables also provide the perfect balance of protein. Processed food has little nutritional value, so try to avoid this. ? ?Medications ? ?Resume taking all your medications unless your doctor or nurse practitioner tells you not to. If your incision is causing pain, you may take over-the-counter pain relievers such as acetaminophen (Tylenol). If you were prescribed a stronger pain medication, please aware these medication can cause nausea and constipation. Prevent nausea by taking the medication with a snack or meal. Avoid constipation by drinking plenty of fluids and eating foods with high amount of fiber, such as fruits, vegetables, and grains. Take Colase 100 mg (an over-the-counter stool softener) twice a day as needed for constipation. Do not take Tylenol if you are taking prescription pain medications. ? ?Follow Up ? ?Our office will schedule a follow up appointment 2-3 weeks following discharge. ? ?Please call us immediately for any of the following conditions ? ?Severe or worsening pain in your legs or feet while at rest or while walking Increase pain, redness, warmth, or drainage (pus) from your incision site(s) ?Fever of 101 degree or higher ?The swelling in your leg with the bypass suddenly worsens and becomes more painful than when you were in the hospital ?If you have been instructed to feel your graft pulse then you should do so every day. If you can no longer feel this pulse, call the office immediately. Not all patients are given this instruction. ? ?Leg  swelling is common after leg bypass surgery. ? ?The swelling should improve over a few months following surgery. To improve the swelling, you may elevate your legs above the level of your heart while you are sitting or resting. Your surgeon or physician assistant may ask you to apply an ACE wrap or wear compression (TED) stockings to help to reduce swelling. ? ?Reduce your risk of vascular  disease ? ?Stop smoking. If you would like help call [facility] at 1-800-QUIT-NOW 743-213-7590) or [place] at ([phone]. ? ?Manage your cholesterol ?Maintain a desired weight ?Control your diabetes weight ?Control your diabetes ?Keep your blood pressure down ? ?If you have any questions, please call the office at [phone] ? ? ?

## 2021-08-20 NOTE — Progress Notes (Addendum)
?  Progress Note ? ? ? ?08/20/2021 ?7:49 AM ?2 Days Post-Op ? ?Subjective:  ready for discharge ? ? ?Vitals:  ? 08/20/21 0415 08/20/21 0417  ?BP: (!) 160/98 (!) 148/82  ?Pulse: 87   ?Resp: 16   ?Temp: 98.5 ?F (36.9 ?C)   ?SpO2: 93%   ? ?Physical Exam: ?Lungs:  non labored ?Incisions:  L groin incisional vac with good seal; L pop c/d/i ?Extremities:  brisk DP doppler signals bilaterally ?Neurologic: A&O ? ?CBC ?   ?Component Value Date/Time  ? WBC 8.6 08/19/2021 0332  ? RBC 4.24 08/19/2021 0332  ? HGB 11.3 (L) 08/19/2021 0332  ? HGB 13.7 08/11/2021 1011  ? HCT 36.6 (L) 08/19/2021 0332  ? HCT 43.1 08/11/2021 1011  ? PLT 274 08/19/2021 0332  ? PLT 323 08/11/2021 1011  ? MCV 86.3 08/19/2021 0332  ? MCV 83 08/11/2021 1011  ? MCH 26.7 08/19/2021 0332  ? MCHC 30.9 08/19/2021 0332  ? RDW 19.2 (H) 08/19/2021 0332  ? RDW 21.4 (H) 08/11/2021 1011  ? LYMPHSABS 1.6 08/11/2021 1011  ? MONOABS 0.8 07/25/2021 1359  ? EOSABS 0.2 08/11/2021 1011  ? BASOSABS 0.1 08/11/2021 1011  ? ? ?BMET ?   ?Component Value Date/Time  ? NA 133 (L) 08/19/2021 0332  ? NA 137 08/11/2021 1011  ? K 4.8 08/19/2021 0332  ? CL 96 (L) 08/19/2021 0332  ? CO2 28 08/19/2021 0332  ? GLUCOSE 261 (H) 08/19/2021 0332  ? BUN 14 08/19/2021 0332  ? BUN 20 08/11/2021 1011  ? CREATININE 1.30 (H) 08/19/2021 0332  ? CALCIUM 9.5 08/19/2021 0332  ? GFRNONAA 59 (L) 08/19/2021 0332  ? GFRAA 54 (L) 06/20/2020 1529  ? ? ?INR ?   ?Component Value Date/Time  ? INR 1.1 08/15/2021 1023  ? ? ? ?Intake/Output Summary (Last 24 hours) at 08/20/2021 0749 ?Last data filed at 08/20/2021 0417 ?Gross per 24 hour  ?Intake 243 ml  ?Output 1900 ml  ?Net -1657 ml  ? ? ? ?Assessment/Plan:  71 y.o. male is s/p redo L femoral to popliteal bypass with PTFE and R TP trunk and peroneal artery stenting 2 Days Post-Op  ? ?BLE well perfused based on doppler exam ?Toe wounds are dry; patient is aware they will take time to heal ?Continue aspirin, plavix and Eliquis at discharge ?TOC arranged [facility] however they  are unable to start until 4/17; patient and his wife are aware and are ok with this ?D/c home today; follow up in a couple weeks for wound check; educated patient and wife to peel off the incisional vac from L groin this weekend ? ? ?Dagoberto Ligas, PA-C ?[facility] ?[phone] ?08/20/2021 ?7:49 AM ? ? ? ?

## 2021-08-21 LAB — CULTURE, BLOOD (ROUTINE X 2)
Culture: NO GROWTH
Culture: NO GROWTH
Special Requests: ADEQUATE
Special Requests: ADEQUATE

## 2021-08-21 NOTE — Discharge Summary (Signed)
?Bypass Discharge Summary ?Patient ID: ?[name] [name] ?[id number] ?71 y.o. ?1950/09/01 ? ?Admit date: 08/15/2021 ? ?Discharge date and time: 08/20/2021 12:49 PM  ? ?Admitting Physician: [name], MD  ? ?Discharge Physician: same ? ?Admission Diagnoses: Atherosclerosis of arteries of extremities ([place]) [I70.209] ?Critical lower limb ischemia ([place]) [I70.229] ? ?Discharge Diagnoses: same ? ?Admission Condition: fair ? ?Discharged Condition: fair ? ?Indication for Admission: surgery ? ?Hospital Course: [name] is a 71 year old male with tissue loss of bilateral lower extremities.  He was brought in as an outpatient and underwent redo left femoral to below the knee popliteal artery bypass with PTFE and peroneal angioplasty by Dr. [name] on 08/15/2021.  Endovascular revascularization was attempted intraoperatively however was unsuccessful.  He was admitted to the hospital postoperatively.  He subsequently underwent stenting of the right TP trunk and peroneal artery via pedal access by Dr. Donzetta Matters on 08/18/2021.  The remainder of his hospital stay was uneventful.  He worked with therapy teams to improve mobility.  TOC arranged home health PT.  He was also transitioned back to his Eliquis.  He will take Plavix and aspirin in addition.  He will follow-up in 2 weeks for incision check.  He was also prescribed 2 to 3 days of narcotic pain medication for continued postoperative pain control.  He was discharged home in stable condition. ? ?Consults: None ? ?Treatments: surgery: Redo left femoral to below the knee popliteal bypass with PTFE and peroneal artery angioplasty by Dr. [name] on 08/15/2021 ? ?Stenting of the right TP trunk and peroneal artery via pedal access by Dr. Donzetta Matters on 08/18/2021 ? ? ? ?Disposition: Discharge disposition: 01-Home or Self Care ? ? ? ? ? ? ?- For [facility] Registry use --- ? ?Post-op:  ?Wound infection: No  ?Graft infection: No  ?Transfusion: No   ?New Arrhythmia: No ?Patency judged by: [ x] Dopper only,  '[ ]'$  Palpable graft pulse, '[ ]'$  Palpable distal pulse, '[ ]'$  ABI inc. > 0.15, '[ ]'$  Duplex ?D/C Ambulatory Status: Ambulatory with Assistance ? ?Complications: ?MI: [x ] No, '[ ]'$  Troponin only, '[ ]'$  EKG or Clinical ?CHF: No ?Resp failure: [ x] none, '[ ]'$  Pneumonia, '[ ]'$  Ventilator ?Chg in renal function: [ x] none, '[ ]'$  Inc. Cr > 0.5, '[ ]'$  Temp. Dialysis, '[ ]'$  Permanent dialysis ?Stroke: [x ] None, '[ ]'$  Minor, '[ ]'$  Major ?Return to OR: No  ?Reason for return to OR: '[ ]'$  Bleeding, '[ ]'$  Infection, '[ ]'$  Thrombosis, '[ ]'$  Revision ? ?Discharge medications: ?Statin use:  Yes ?ASA use:  Yes ?Plavix use:  Yes ?Beta blocker use: Yes ?Coumadin use: Yes, Eliquis ? ? ? ?Patient Instructions:  ?Allergies as of 08/20/2021   ? ?   Reactions  ? Shellfish Allergy Anaphylaxis, Other (See Comments)  ? Tongue swelling, hives  ? Sulfa Antibiotics Anaphylaxis, Rash  ? Tongue swelling, hives  ? Ace Inhibitors Other (See Comments), Cough  ? CKD, renal failure   ? Escitalopram Other (See Comments)  ? Buzzing in ears,headache, felt like a zombie  ? Evolocumab Other (See Comments)  ? Myalgias, flu like sx  ? Fenofibrate Other (See Comments)  ? Body aches - pt currently taking isnt sure if its causing any pain  ? Invokana [canagliflozin] Other (See Comments)  ? Syncope / dehydration  ? Lisinopril Cough  ? Metformin And Related Itching  ? Pravastatin Sodium Other (See Comments)  ? myalgias  ? Crestor [rosuvastatin] Other (See Comments)  ? Myalgias  ?  Horse-derived Products Rash  ? horse serum  ? Lac [place]ovis Nausea Only  ? Ties stomach in knots   ? Lexapro [escitalopram Oxalate] Other (See Comments)  ? Buzzing in ears,headache, felt like a zombie  ? Lipitor [atorvastatin] Other (See Comments)  ? myalgias  ? Livalo [pitavastatin] Other (See Comments)  ? Myalgias  ? Tape Rash  ? ?  ? ?  ?Medication List  ?  ? ?TAKE these medications   ? ?albuterol 108 (90 Base) MCG/ACT inhaler ?Commonly known as: VENTOLIN HFA ?Inhale 2 puffs into the lungs every 6 (six) hours as needed  for wheezing or shortness of breath. ?  ?ALKA-SELTZER PLUS COLD PO ?Take 2 tablets by mouth daily. ?  ?amoxicillin 500 MG capsule ?Commonly known as: AMOXIL ?Take 1 capsule (500 mg total) by mouth 3 (three) times daily. ?  ?apixaban 5 MG Tabs tablet ?Commonly known as: Eliquis ?Take 1 tablet (5 mg total) by mouth 2 (two) times daily. ?  ?aspirin EC 81 MG tablet ?Take 1 tablet (81 mg total) by mouth daily. Swallow whole. ?  ?BLUE-EMU MAXIMUM STRENGTH EX ?Apply 1 application. topically daily as needed (pain). ?  ?clopidogrel 75 MG tablet ?Commonly known as: PLAVIX ?TAKE 1 TABLET BY MOUTH DAILY ?  ?dapagliflozin propanediol 5 MG Tabs tablet ?Commonly known as: [place] ?Take 1 tablet (5 mg total) by mouth daily before breakfast. ?What changed: how much to take ?  ?DULoxetine 60 MG capsule ?Commonly known as: Cymbalta ?Take 1 capsule (60 mg total) by mouth daily. ?What changed: when to take this ?  ?ezetimibe 10 MG tablet ?Commonly known as: ZETIA ?Take 1 tablet (10 mg total) by mouth daily. ?  ?fenofibrate 160 MG tablet ?Take 1 tablet (160 mg total) by mouth daily. ?  ?fluticasone 50 MCG/ACT nasal spray ?Commonly known as: FLONASE ?Place 1 spray into both nostrils daily as needed for allergies or rhinitis. ?  ?furosemide 20 MG tablet ?Commonly known as: LASIX ?Take Two Times Daily for 7 Days then take Daily ?What changed:  ?how much to take ?how to take this ?when to take this ?reasons to take this ?  ?gabapentin 300 MG capsule ?Commonly known as: NEURONTIN ?Take 1 capsule (300 mg total) by mouth 3 (three) times daily. ?  ?HumuLIN 70/30 (70-30) 100 UNIT/ML injection ?Generic drug: insulin NPH-regular Human ?INJECT 60 UNITS SUBCUTANEOUSLY TWO TIMES DAILY WITH A MEAL ?What changed: See the new instructions. ?  ?HYDROcodone-acetaminophen 10-325 MG tablet ?Commonly known as: NORCO ?Take 1 tablet by mouth every 4 (four) hours as needed for moderate pain. ?What changed: when to take this ?  ?insulin lispro 100 UNIT/ML  KwikPen ?Commonly known as: HumaLOG KwikPen ?Inject 15-20 Units into the skin 3 (three) times daily with meals. ?What changed: additional instructions ?  ?Insulin Syringe-Needle U-100 28G X 1/2" 1 ML Misc ?Use to give insulin five times daily Dx E11.42 ?  ?isosorbide mononitrate 30 MG 24 hr tablet ?Commonly known as: IMDUR ?Take 1 tablet (30 mg total) by mouth daily. ?  ?metoprolol succinate 50 MG 24 hr tablet ?Commonly known as: TOPROL-XL ?Take 1 tablet (50 mg total) by mouth daily. Take with or immediately following a meal. ?  ?metoprolol tartrate 50 MG tablet ?Commonly known as: LOPRESSOR ?Take 1 tablet (50 mg total) by mouth daily as needed (a-fib). ?  ?Mounjaro 5 MG/0.5ML Pen ?Generic drug: tirzepatide ?INJECT '5MG'$  SUBCUTANEOUSLY ONCE A WEEK ?What changed: See the new instructions. ?  ?nitroGLYCERIN 0.4 MG SL tablet ?Commonly known  as: NITROSTAT ?DISSOLVE ONE TABLET UNDER THE TONGUE EVERY 5 MINUTES AS NEEDED FOR CHEST PAIN.  DO NOT EXCEED A TOTAL OF 3 DOSES IN 15 MINUTES ?Strength: 0.4 mg ?  ?pantoprazole 40 MG tablet ?Commonly known as: PROTONIX ?Take 1 tablet (40 mg total) by mouth daily. ?  ?traZODone 100 MG tablet ?Commonly known as: DESYREL ?Take 1 tablet (100 mg total) by mouth at bedtime. ?  ?vitamin B-12 1000 MCG tablet ?Commonly known as: CYANOCOBALAMIN ?Take 1,000 mcg by mouth daily. ?  ? ?  ? ?Activity: activity as tolerated ?Diet: regular diet ?Wound Care: keep wound clean and dry ? ?Follow-up with [facility] in 2 weeks. ? ?Signed: ?Dagoberto Ligas ?08/21/2021 ?11:57 AM ? ?

## 2021-08-23 NOTE — Progress Notes (Signed)
? ?Cardiology Office Note   ? ?Date:  09/01/2021  ? ?ID:  [name] [name], DOB [birth year], MRN [id number] ? ?PCP:  [name], FNP  ?Cardiologist: Jenkins Rouge, MD   ? ?No chief complaint on file. ? ? ? ?History of Present Illness:   ? ?71 y.o. with history of PAF, PAD, HTN, HLD IDDM prior CVA. Extensive history of CAD with most cath 05/24/20 resulting in stents to circumflex/OM for in stent restenosis Chronic CTO RCA with good left to right collaterals Occluded D2 and distal circumflex with Left to left collaterals Has had PAF conversion with amiodarone but felt poorly on this Sees Dr Lovena Le and has ILR for cryptogenic stroke 07/18/20 he mentioned starting Multaq but never done   ? ?PVD followed by [name] Stents to right SFA 04/17/19 unable to recanalize left  Had hybrid procedure 11/22/20 with  Left fem-pop and stent right SFA Repeat angio 01/28/21 for ulcers Right stent patient occluded tibial vessels single vessel run off collaterals to PT at ankle Left patient graft single vessel run off no intervention done  ? ?Had cath 08/06/21 to clear for PV procedure Subtotal occlusion of stents in RCA with left to right collaterals Patent proximal circ/OM stents with left to left collaterals to OM3 and Occluded D2 CAD stable medical Rx ? ?08/15/21 Redo left femoral to below knee popliteal bypass with PTFE and peroneal PCI ? ?08/18/21 Had hybrid PV procedure with Dr Idalia Needle critical RLE limb ischemia with ulcers he had stenting of the TP trunk on right  he was d/c on 08/20/21  ? ?Taking lasix 40 mg bid and some LE edema as he has arterial and venous dx ? ? ? ?Past Medical History:  ?Diagnosis Date  ? Anxiety   ? Arthritis   ? Atrial fibrillation ([place])   ? CAD (coronary artery disease)   ? a. 2010: DES to CTO of RCA. EF 55% b. 07/2016: cath showing total occlusion within previously placed RCA stent (collaterals present), severe stenosis along LCx and OM1 (treated with 2 overlapping DES). c. repeat cath in 01/2018 showing patent  stents along LCx and OM with CTO of D2, CTO of distal LCx, and CTO of RCA with collaterals present overall unchanged since 2018 with medical management recom  ? Cellulitis and abscess rt groin   ? Complication of anesthesia   ? " I woke up during a colonoscopy "     ? Depression   ? Diabetes mellitus   ? Diastolic CHF ([place])   ? Disorders of iron metabolism   ? Dysrhythmia   ? Fibromyalgia   ? GERD (gastroesophageal reflux disease)   ? History of hiatal hernia   ? Hyperlipidemia   ? Hypertension   ? Low serum testosterone level   ? Medically noncompliant   ? Myocardial infarction [facility])   ? 05-23-20  ? Pneumonia   ? ? ?Past Surgical History:  ?Procedure Laterality Date  ? ABDOMINAL AORTOGRAM W/LOWER EXTREMITY N/A 03/21/2019  ? Procedure: ABDOMINAL AORTOGRAM W/LOWER EXTREMITY;  Surgeon: [name], MD;  Location: [place] [facility];  Service: Cardiovascular;  Laterality: N/A;  ? ABDOMINAL AORTOGRAM W/LOWER EXTREMITY N/A 11/12/2020  ? Procedure: ABDOMINAL AORTOGRAM W/LOWER EXTREMITY;  Surgeon: [name], MD;  Location: [place] [facility];  Service: Cardiovascular;  Laterality: N/A;  ? ABDOMINAL AORTOGRAM W/LOWER EXTREMITY N/A 01/28/2021  ? Procedure: ABDOMINAL AORTOGRAM W/LOWER EXTREMITY;  Surgeon: [name], MD;  Location: [place] [facility];  Service: Cardiovascular;  Laterality: N/A;  ? ABDOMINAL AORTOGRAM W/LOWER EXTREMITY Bilateral 07/29/2021  ? Procedure: ABDOMINAL AORTOGRAM W/LOWER EXTREMITY;  Surgeon: [name], MD;  Location: [place] [facility];  Service: Cardiovascular;  Laterality: Bilateral;  ? ANGIOPLASTY N/A 08/15/2021  ? Procedure: ATTEMPTED RIGHT PERONEAL  ANGIOPLASTY, LEFT PERONEAL ANGIOPLASTY;  Surgeon: [name], MD;  Location: [place];  Service: Vascular;  Laterality: N/A;  ? BACK SURGERY  2015  ? ACDF by Dr. [name]  ? COLONOSCOPY N/A 10/01/2014  ? Dr. [name]: multiple tubular adenomas removed, colonic diverticulosis, redundant colon. next tcs advised for 09/2017. PATIENT NEEDS  PROPOFOL FOR FAILED CONSCIOUS SEDATION  ? CORONARY STENT INTERVENTION N/A 07/30/2016  ? Procedure: Coronary Stent Intervention;  Surgeon: [name], MD;  Location: [place] [facility];  Service: Cardiovascular;  Laterality: N/A;  ? CORONARY STENT PLACEMENT  2000  ? By Dr. Olevia Perches  ? EP IMPLANTABLE DEVICE N/A 05/25/2016  ? Procedure: Loop Recorder Insertion;  Surgeon: [name], MD;  Location: [place] [facility];  Service: Cardiovascular;  Laterality: N/A;  ? ESOPHAGOGASTRODUODENOSCOPY    ? esophagus stretched [facility] at [facility]  ? ESOPHAGOGASTRODUODENOSCOPY N/A 10/01/2014  ? Dr. [name]: patchy mottling/erythema and minimal polypoid appearance of gastric mucosa. bx with mild inlammation but no H.pylori  ? FEMORAL-POPLITEAL BYPASS GRAFT Left 11/22/2020  ? Procedure: LEFT FEMORAL-POPLITEAL BYPASS GRAFT;  Surgeon: [name], MD;  Location: [facility] OR;  Service: Vascular;  Laterality: Left;  ? FEMORAL-POPLITEAL BYPASS GRAFT Left 08/15/2021  ? Procedure: REDO LEFT FEMORAL-POPLITEAL BYPASS USING PROPATEN GRAFT;  Surgeon: [name], MD;  Location: [facility] OR;  Service: Vascular;  Laterality: Left;  ? HERNIA REPAIR  7035  ? umbilical  ? INSERTION OF ILIAC STENT Right 11/22/2020  ? Procedure: INSERTION OF ELUVIA STENT INTO RIGHT DISTAL SUPERFICIAL FEMORAL ARTERY;  Surgeon: [name], MD;  Location: [place];  Service: Vascular;  Laterality: Right;  ? LEFT HEART CATH AND CORONARY ANGIOGRAPHY N/A 07/30/2016  ? Procedure: Left Heart Cath and Coronary Angiography;  Surgeon: [name], MD;  Location: [place] [facility];  Service: Cardiovascular;  Laterality: N/A;  ? LEFT HEART CATH AND CORONARY ANGIOGRAPHY N/A 01/19/2018  ? Procedure: LEFT HEART CATH AND CORONARY ANGIOGRAPHY;  Surgeon: [name], MD;  Location: [place] [facility];  Service: Cardiovascular;  Laterality: N/A;  ? LEFT HEART CATH AND CORONARY ANGIOGRAPHY N/A 05/24/2020  ? Procedure: LEFT HEART CATH AND CORONARY ANGIOGRAPHY;  Surgeon: Burnell Blanks, MD;  Location: [place] [facility];  Service: Cardiovascular;  Laterality: N/A;  ? LEFT HEART CATH AND CORONARY ANGIOGRAPHY N/A 08/06/2021  ? Procedure: LEFT HEART CATH AND CORONARY ANGIOGRAPHY;  Surgeon: Burnell Blanks, MD;  Location: [place] [facility];  Service: Cardiovascular;  Laterality: N/A;  ? LESION REMOVAL    ? Lip and hand   ? LOWER EXTREMITY ANGIOGRAM Right 11/22/2020  ? Procedure: RIGHT LEG ANGIOGRAM;  Surgeon: [name], MD;  Location: [facility] OR;  Service: Vascular;  Laterality: Right;  ? LOWER EXTREMITY ANGIOGRAM Right 08/15/2021  ? Procedure: RIGHT LOWER EXTREMITY ANGIOGRAM;  Surgeon: [name], MD;  Location: [facility] OR;  Service: Vascular;  Laterality: Right;  ? LOWER EXTREMITY ANGIOGRAPHY N/A 04/18/2019  ? Procedure: LOWER EXTREMITY ANGIOGRAPHY;  Surgeon: [name], MD;  Location: [place] [facility];  Service: Cardiovascular;  Laterality: N/A;  ? LOWER EXTREMITY ANGIOGRAPHY N/A 08/18/2021  ? Procedure: Lower Extremity Angiography;  Surgeon: Waynetta Sandy, MD;  Location: [facility]  INVASIVE CV LAB;  Service: Cardiovascular;  Laterality: N/A;  ? NECK SURGERY    ? PERIPHERAL VASCULAR BALLOON ANGIOPLASTY  04/18/2019  ? Procedure: PERIPHERAL VASCULAR BALLOON ANGIOPLASTY;  Surgeon: [name], MD;  Location: [place] [facility];  Service: Cardiovascular;;  ? PERIPHERAL VASCULAR BALLOON ANGIOPLASTY Left 11/12/2020  ? Procedure: PERIPHERAL VASCULAR BALLOON ANGIOPLASTY;  Surgeon: [name], MD;  Location: [place] [facility];  Service: Cardiovascular;  Laterality: Left;  Failed PTA of superficial femoral artery.  ? PERIPHERAL VASCULAR BALLOON ANGIOPLASTY Right 08/18/2021  ? Procedure: PERIPHERAL VASCULAR BALLOON ANGIOPLASTY;  Surgeon: Waynetta Sandy, MD;  Location: [place] [facility];  Service: Cardiovascular;  Laterality: Right;  peroneal  ? PERIPHERAL VASCULAR INTERVENTION Right 03/21/2019  ? Procedure: PERIPHERAL VASCULAR INTERVENTION;  Surgeon: [name], MD;   Location: [place] [facility];  Service: Cardiovascular;  Laterality: Right;  superficial femoral  ? PERIPHERAL VASCULAR INTERVENTION Right 08/18/2021  ? Procedure: PERIPHERAL VASCULAR INTERVENTION;  Reather Converse

## 2021-08-28 ENCOUNTER — Other Ambulatory Visit: Payer: Self-pay | Admitting: Cardiovascular Disease

## 2021-09-01 ENCOUNTER — Ambulatory Visit: Payer: Medicare Other | Admitting: Cardiovascular Disease

## 2021-09-01 ENCOUNTER — Encounter: Payer: Self-pay | Admitting: Cardiovascular Disease

## 2021-09-01 VITALS — BP 110/78 | HR 80 | Ht 74.0 in | Wt 299.0 lb

## 2021-09-01 DIAGNOSIS — I2510 Atherosclerotic heart disease of native coronary artery without angina pectoris: Secondary | ICD-10-CM

## 2021-09-01 DIAGNOSIS — I480 Paroxysmal atrial fibrillation: Secondary | ICD-10-CM | POA: Diagnosis not present

## 2021-09-01 DIAGNOSIS — M7989 Other specified soft tissue disorders: Secondary | ICD-10-CM

## 2021-09-01 DIAGNOSIS — I739 Peripheral vascular disease, unspecified: Secondary | ICD-10-CM | POA: Diagnosis not present

## 2021-09-01 MED ORDER — METOLAZONE 5 MG PO TABS: 5.0000 mg | ORAL_TABLET | Freq: Every day | ORAL | 3 refills | Status: DC | PRN

## 2021-09-01 NOTE — Patient Instructions (Addendum)
Medication Instructions:  ?Your physician recommends that you continue on your current medications as directed. Please refer to the Current Medication list given to you today. ? ?Take Zaroxolyn 5 mg Daily as needed for swelling ? ?*If you need a refill on your cardiac medications before your next appointment, please call your pharmacy* ? ? ?Lab Work: ?NONE  ? ?If you have labs (blood work) drawn today and your tests are completely normal, you will receive your results only by: ?MyChart Message (if you have MyChart) OR ?A paper copy in the mail ?If you have any lab test that is abnormal or we need to change your treatment, we will call you to review the results. ? ? ?Testing/Procedures: ?NONE  ? ? ?Follow-Up: ?At [facility], you and your health needs are our priority.  As part of our continuing mission to provide you with exceptional heart care, we have created designated Provider Care Teams.  These Care Teams include your primary Cardiologist (physician) and Advanced Practice Providers (APPs -  Physician Assistants and Nurse Practitioners) who all work together to provide you with the care you need, when you need it. ? ?We recommend signing up for the patient portal called "MyChart".  Sign up information is provided on this After Visit Summary.  MyChart is used to connect with patients for Virtual Visits (Telemedicine).  Patients are able to view lab/test results, encounter notes, upcoming appointments, etc.  Non-urgent messages can be sent to your provider as well.   ?To learn more about what you can do with MyChart, go to [email or web address].   ? ?Your next appointment:   ?3 month(s) ? ?The format for your next appointment:   ?In Person ? ?Provider:   ?Jenkins Rouge, MD  ? ? ?Other Instructions ?Thank you for choosing [place]! ? ? ? ?Important Information About [place] ? ? ? ? ?  ?

## 2021-09-02 ENCOUNTER — Ambulatory Visit: Payer: Medicare Other | Admitting: Family

## 2021-09-02 DIAGNOSIS — Z48812 Encounter for surgical aftercare following surgery on the circulatory system: Secondary | ICD-10-CM | POA: Diagnosis not present

## 2021-09-02 DIAGNOSIS — I70203 Unspecified atherosclerosis of native arteries of extremities, bilateral legs: Secondary | ICD-10-CM | POA: Diagnosis not present

## 2021-09-02 DIAGNOSIS — E1140 Type 2 diabetes mellitus with diabetic neuropathy, unspecified: Secondary | ICD-10-CM | POA: Diagnosis not present

## 2021-09-02 DIAGNOSIS — E119 Type 2 diabetes mellitus without complications: Secondary | ICD-10-CM | POA: Diagnosis not present

## 2021-09-02 DIAGNOSIS — I70229 Atherosclerosis of native arteries of extremities with rest pain, unspecified extremity: Secondary | ICD-10-CM | POA: Diagnosis not present

## 2021-09-02 DIAGNOSIS — I739 Peripheral vascular disease, unspecified: Secondary | ICD-10-CM | POA: Diagnosis not present

## 2021-09-03 ENCOUNTER — Other Ambulatory Visit: Payer: Self-pay | Admitting: Family

## 2021-09-03 DIAGNOSIS — F331 Major depressive disorder, recurrent, moderate: Secondary | ICD-10-CM

## 2021-09-03 DIAGNOSIS — E1142 Type 2 diabetes mellitus with diabetic polyneuropathy: Secondary | ICD-10-CM

## 2021-09-05 ENCOUNTER — Telehealth: Payer: Self-pay | Admitting: *Deleted

## 2021-09-05 DIAGNOSIS — E1140 Type 2 diabetes mellitus with diabetic neuropathy, unspecified: Secondary | ICD-10-CM | POA: Diagnosis not present

## 2021-09-05 DIAGNOSIS — I70229 Atherosclerosis of native arteries of extremities with rest pain, unspecified extremity: Secondary | ICD-10-CM | POA: Diagnosis not present

## 2021-09-05 DIAGNOSIS — I739 Peripheral vascular disease, unspecified: Secondary | ICD-10-CM | POA: Diagnosis not present

## 2021-09-05 DIAGNOSIS — E119 Type 2 diabetes mellitus without complications: Secondary | ICD-10-CM | POA: Diagnosis not present

## 2021-09-05 DIAGNOSIS — Z48812 Encounter for surgical aftercare following surgery on the circulatory system: Secondary | ICD-10-CM | POA: Diagnosis not present

## 2021-09-05 DIAGNOSIS — I70203 Unspecified atherosclerosis of native arteries of extremities, bilateral legs: Secondary | ICD-10-CM | POA: Diagnosis not present

## 2021-09-05 NOTE — Telephone Encounter (Signed)
[facility] Nurse called and requested an appt for patient she states his foot is weeping and has blackened area. Advised her to send patient to  ER if he is having any type of fever or increasing pain to his foot. She will place dry dressing to wound. And advise patient to go to ER if foot gets worse. Patient will call Monday to schedule an appt for evaluation. ?

## 2021-09-08 ENCOUNTER — Encounter: Payer: Medicare Other | Admitting: Surgery

## 2021-09-08 DIAGNOSIS — M47816 Spondylosis without myelopathy or radiculopathy, lumbar region: Secondary | ICD-10-CM | POA: Diagnosis not present

## 2021-09-09 ENCOUNTER — Telehealth: Payer: Self-pay

## 2021-09-09 NOTE — Telephone Encounter (Signed)
I spoke with patient to schedule an appt after Triage call received on Friday. Per pt he is feeling ok and denies Fever,chills, body aches. He states he would prefer an appt for Friday with Dr. [name]. I advised if he develops sxs or worsening pains he should contact our office for a sooner appt. Pt voiced his understanding. ?

## 2021-09-10 ENCOUNTER — Other Ambulatory Visit: Payer: Self-pay | Admitting: Family

## 2021-09-10 DIAGNOSIS — E1142 Type 2 diabetes mellitus with diabetic polyneuropathy: Secondary | ICD-10-CM

## 2021-09-10 DIAGNOSIS — F331 Major depressive disorder, recurrent, moderate: Secondary | ICD-10-CM

## 2021-09-10 DIAGNOSIS — I2510 Atherosclerotic heart disease of native coronary artery without angina pectoris: Secondary | ICD-10-CM

## 2021-09-11 DIAGNOSIS — I70203 Unspecified atherosclerosis of native arteries of extremities, bilateral legs: Secondary | ICD-10-CM | POA: Diagnosis not present

## 2021-09-11 DIAGNOSIS — E119 Type 2 diabetes mellitus without complications: Secondary | ICD-10-CM | POA: Diagnosis not present

## 2021-09-11 DIAGNOSIS — E1140 Type 2 diabetes mellitus with diabetic neuropathy, unspecified: Secondary | ICD-10-CM | POA: Diagnosis not present

## 2021-09-11 DIAGNOSIS — I70229 Atherosclerosis of native arteries of extremities with rest pain, unspecified extremity: Secondary | ICD-10-CM | POA: Diagnosis not present

## 2021-09-11 DIAGNOSIS — Z48812 Encounter for surgical aftercare following surgery on the circulatory system: Secondary | ICD-10-CM | POA: Diagnosis not present

## 2021-09-11 DIAGNOSIS — I739 Peripheral vascular disease, unspecified: Secondary | ICD-10-CM | POA: Diagnosis not present

## 2021-09-12 ENCOUNTER — Other Ambulatory Visit: Payer: Self-pay

## 2021-09-12 ENCOUNTER — Ambulatory Visit (INDEPENDENT_AMBULATORY_CARE_PROVIDER_SITE_OTHER): Payer: Medicare Other | Admitting: Surgery

## 2021-09-12 ENCOUNTER — Encounter: Payer: Self-pay | Admitting: Surgery

## 2021-09-12 ENCOUNTER — Ambulatory Visit (HOSPITAL_COMMUNITY)
Admission: RE | Admit: 2021-09-12 | Discharge: 2021-09-12 | Disposition: A | Discharge status: Home / Self Care | Payer: Medicare Other | Source: Ambulatory Visit | Attending: Surgery | Admitting: Surgery

## 2021-09-12 VITALS — BP 100/57 | HR 77 | Temp 98.1°F | Resp 20 | Ht 74.0 in | Wt 299.0 lb

## 2021-09-12 DIAGNOSIS — S0990XA Unspecified injury of head, initial encounter: Secondary | ICD-10-CM | POA: Diagnosis not present

## 2021-09-12 DIAGNOSIS — I7025 Atherosclerosis of native arteries of other extremities with ulceration: Secondary | ICD-10-CM

## 2021-09-12 DIAGNOSIS — R519 Headache, unspecified: Secondary | ICD-10-CM | POA: Diagnosis not present

## 2021-09-12 MED ORDER — CEPHALEXIN 500 MG PO CAPS: 500.0000 mg | ORAL_CAPSULE | Freq: Three times a day (TID) | ORAL | 0 refills | Status: DC

## 2021-09-12 NOTE — Progress Notes (Signed)
? ?Patient name: [name] [name] MRN: [id number] DOB: [birth year] Sex: male ? ?REASON FOR VISIT:  ? ? ? Post op ? ?HISTORY OF PRESENT ILLNESS:  ? ?[name] [name] is a 71 y.o. male who has undergone the following: ? ?03/21/2019: Right superficial femoral artery stent (claudication) ?04/18/2019: Failed angioplasty, left superficial femoral artery occlusion (claudication) ?11/12/2020: Failed angioplasty, left superficial femoral artery (ulcer) ?11/22/2020: Left femoral to above-knee popliteal artery bypass graft with vein, right superficial femoral artery stent (bilateral ulcers ?01/28/2021: Abdominal aortogram with bilateral runoff ?08/15/2021: Left femoral to below-knee popliteal artery bypass graft with 6 mm PTFE ?08/18/2021: Retrograde peroneal cannulation with right tibioperoneal trunk stent ? ?He is back today for wound follow-up.  He did fall recently and has been having persistent headaches since.  He also states that he has gotten chills and had a low-grade fever yesterday. ?CURRENT MEDICATIONS:  ? ? ?Current Outpatient Medications  ?Medication Sig Dispense Refill  ? albuterol (VENTOLIN HFA) 108 (90 Base) MCG/ACT inhaler Inhale 2 puffs into the lungs every 6 (six) hours as needed for wheezing or shortness of breath. 8.5 g 3  ? amoxicillin (AMOXIL) 500 MG capsule Take 1 capsule (500 mg total) by mouth 3 (three) times daily. 21 capsule 0  ? apixaban (ELIQUIS) 5 MG TABS tablet Take 1 tablet (5 mg total) by mouth 2 (two) times daily. 180 tablet 1  ? aspirin EC 81 MG tablet Take 1 tablet (81 mg total) by mouth daily. Swallow whole. 150 tablet 2  ? cephALEXin (KEFLEX) 500 MG capsule Take 1 capsule (500 mg total) by mouth 3 (three) times daily. 30 capsule 0  ? Chlorphen-Phenyleph-ASA (ALKA-SELTZER PLUS COLD PO) Take 2 tablets by mouth daily.    ? clopidogrel (PLAVIX) 75 MG tablet TAKE 1 TABLET BY MOUTH DAILY 30 tablet 3  ? dapagliflozin propanediol (FARXIGA) 5 MG TABS tablet Take 1 tablet (5 mg  total) by mouth daily before breakfast. (Patient taking differently: Take 10 mg by mouth daily before breakfast.) 90 tablet 2  ? DULoxetine (CYMBALTA) 60 MG capsule Take 1 capsule (60 mg total) by mouth daily. (Patient taking differently: Take 60 mg by mouth at bedtime.) 90 capsule 1  ? ezetimibe (ZETIA) 10 MG tablet Take 1 tablet (10 mg total) by mouth daily. 90 tablet 3  ? fenofibrate 160 MG tablet Take 1 tablet (160 mg total) by mouth daily. 90 tablet 3  ? fluticasone (FLONASE) 50 MCG/ACT nasal spray Place 1 spray into both nostrils daily as needed for allergies or rhinitis.    ? furosemide (LASIX) 20 MG tablet Take Two Times Daily for 7 Days then take Daily (Patient taking differently: Take 20 mg by mouth daily as needed for fluid. Take Two Times Daily for 7 Days then take Daily) 97 tablet 3  ? gabapentin (NEURONTIN) 300 MG capsule Take 1 capsule (300 mg total) by mouth 3 (three) times daily. 270 capsule 1  ? HUMULIN 70/30 (70-30) 100 UNIT/ML injection INJECT 60 UNITS SUBCUTANEOUSLY TWICE DAILY WITH A MEAL 40 mL 2  ? HYDROcodone-acetaminophen (NORCO) 10-325 MG tablet Take 1 tablet by mouth every 4 (four) hours as needed for moderate pain. 30 tablet 0  ? insulin lispro (HUMALOG KWIKPEN) 100 UNIT/ML KwikPen Inject 15-20 Units into the skin 3 (three) times daily with meals. (Patient taking differently: Inject 15-20 Units into the skin 3 (three) times daily with meals. Sliding scale) 30 mL 2  ? Insulin Syringe-Needle U-100 28G X 1/2" 1 ML MISC Use to give insulin  five times daily Dx E11.42 500 each 3  ? isosorbide mononitrate (IMDUR) 30 MG 24 hr tablet TAKE 1 TABLET BY MOUTH DAILY 30 tablet 2  ? Menthol, Topical Analgesic, (BLUE-EMU MAXIMUM STRENGTH EX) Apply 1 application. topically daily as needed (pain).    ? metolazone (ZAROXOLYN) 5 MG tablet Take 1 tablet (5 mg total) by mouth daily as needed (Swelling). 30 tablet 3  ? metoprolol succinate (TOPROL-XL) 50 MG 24 hr tablet Take 1 tablet by mouth once daily 90  tablet 1  ? metoprolol tartrate (LOPRESSOR) 50 MG tablet Take 1 tablet (50 mg total) by mouth daily as needed (a-fib). 30 tablet 6  ? MOUNJARO 5 MG/0.5ML Pen INJECT '5MG'$  SUBCUTANEOUSLY ONCE A WEEK (Patient taking differently: Inject 5 mg into the skin once a week. Sunday) 2 mL 10  ? nitroGLYCERIN (NITROSTAT) 0.4 MG SL tablet DISSOLVE ONE TABLET UNDER THE TONGUE EVERY 5 MINUTES AS NEEDED FOR CHEST PAIN.  DO NOT EXCEED A TOTAL OF 3 DOSES IN 15 MINUTES ?Strength: 0.4 mg 25 tablet 0  ? pantoprazole (PROTONIX) 40 MG tablet Take 1 tablet (40 mg total) by mouth daily. 90 tablet 1  ? traZODone (DESYREL) 100 MG tablet Take 1 tablet (100 mg total) by mouth at bedtime. 90 tablet 1  ? vitamin B-12 (CYANOCOBALAMIN) 1000 MCG tablet Take 1,000 mcg by mouth daily.    ? ?No current facility-administered medications for this visit.  ? ? ?REVIEW OF SYSTEMS:  ? ?'[X]'$  denotes positive finding, '[ ]'$  denotes negative finding ?Cardiac  Comments:  ?Chest pain or chest pressure:    ?Shortness of breath upon exertion:    ?Short of breath when lying flat:    ?Irregular heart rhythm:    ?Constitutional    ?Fever or chills:    ? ? ?PHYSICAL EXAM:  ? ?Vitals:  ? 09/12/21 0955  ?BP: (!) 100/57  ?Pulse: 77  ?Resp: 20  ?Temp: 98.1 ?F (36.7 ?C)  ?SpO2: 94%  ?Weight: 299 lb (135.6 kg)  ?Height: '6\' 2"'$  (1.88 m)  ? ? ?GENERAL: The patient is a well-nourished male, in no acute distress. The vital signs are documented above. ?CARDIOVASCULAR: There is a regular rate and rhythm. ?PULMONARY: Non-labored respirations ? ? ? ?STUDIES:  ? ? ? ? ?MEDICAL ISSUES:  ? ?I am starting him on antibiotics given his low-grade fever and occasional chills, however I do not see any obvious infection around the wounds on his feet ? ?Left foot wound: The margins appear clean.  The tissue is dry for the most part.  I offered him transmetatarsal amputation versus wound care.  He wants to continue with wound care and is not interested in amputation. ? ?Headaches: I am a little  concerned since he fell and hit his head and is on blood thinners and has been having persistent headache since his fall several days ago, so I am going to send him for a CT scan of the head to rule out a bleed. ? ?He will follow-up in 2 weeks with bilateral lower extremity duplex ? ?Annamarie Major, IV, MD, FACS ?[facility] ?Tel [phone] ?Pager [phone] ? ? ?  ?

## 2021-09-16 ENCOUNTER — Other Ambulatory Visit: Payer: Self-pay | Admitting: Family

## 2021-09-16 DIAGNOSIS — F331 Major depressive disorder, recurrent, moderate: Secondary | ICD-10-CM

## 2021-09-16 DIAGNOSIS — E1142 Type 2 diabetes mellitus with diabetic polyneuropathy: Secondary | ICD-10-CM

## 2021-09-17 ENCOUNTER — Telehealth: Payer: Self-pay | Admitting: *Deleted

## 2021-09-17 DIAGNOSIS — E1140 Type 2 diabetes mellitus with diabetic neuropathy, unspecified: Secondary | ICD-10-CM | POA: Diagnosis not present

## 2021-09-17 DIAGNOSIS — I70229 Atherosclerosis of native arteries of extremities with rest pain, unspecified extremity: Secondary | ICD-10-CM | POA: Diagnosis not present

## 2021-09-17 DIAGNOSIS — Z48812 Encounter for surgical aftercare following surgery on the circulatory system: Secondary | ICD-10-CM | POA: Diagnosis not present

## 2021-09-17 DIAGNOSIS — I739 Peripheral vascular disease, unspecified: Secondary | ICD-10-CM | POA: Diagnosis not present

## 2021-09-17 DIAGNOSIS — I70203 Unspecified atherosclerosis of native arteries of extremities, bilateral legs: Secondary | ICD-10-CM | POA: Diagnosis not present

## 2021-09-17 DIAGNOSIS — E119 Type 2 diabetes mellitus without complications: Secondary | ICD-10-CM | POA: Diagnosis not present

## 2021-09-17 NOTE — Telephone Encounter (Signed)
TC from [place] w/ [facility] ?[facility] nurse received flag when going through pt's medications ?Flag w/ Clopidogrel (Plavix), Apixaban (Eliquis), and ASA  ?Eliquis last RF 04/29/22 by PCP, Plavix last RF by Dr. Theadora Rama Surg, ASA last RF by Dagoberto Ligas PA-C w/ [facility] ?Please advise if all meds are are correct and current therapy and call Alyse Low so that she may make notes to the chart ?

## 2021-09-18 NOTE — Telephone Encounter (Signed)
Pt does not need to take aspirin. He will need to call Cardiologists about continuing Eliquis and Plavix.  ?

## 2021-09-18 NOTE — Telephone Encounter (Signed)
Christy from [facility] aware and verbalizes understanding.  ?

## 2021-09-19 ENCOUNTER — Other Ambulatory Visit: Payer: Self-pay | Admitting: Family

## 2021-09-19 ENCOUNTER — Other Ambulatory Visit: Payer: Self-pay | Admitting: *Deleted

## 2021-09-19 DIAGNOSIS — E1142 Type 2 diabetes mellitus with diabetic polyneuropathy: Secondary | ICD-10-CM

## 2021-09-19 DIAGNOSIS — F331 Major depressive disorder, recurrent, moderate: Secondary | ICD-10-CM

## 2021-09-19 NOTE — Progress Notes (Signed)
?Office Note  ? ? ? ?CC:  follow up ?Requesting Provider:  [name], FNP ? ?HPI: [name] [name] is a 71 y.o. (1950/09/04) male who presents for wound follow up. He was just seen on 09/12/21 by Dr. [name] at which time his wound was stable with dry gangrene, no acute infection appreciable. He however reported that he was having low grade fever and chills. He also reported a recent fall in which he hit his head so Dr. [name] was concerned about a persistent headache. He scheduled a CT head which was negative. Dr. [name] started him on antibiotics- Keflex. Dr. [name] did offer him TMA vs continued wound care. Patient was not interested in any amputation and wished to continue local wound care to his left foot.  ? ?He returns today for wound check and BLE arterial duplex. He reports no pain on ambulation or rest. He says his ambulation is really only limited by his back pain, which has been a chronic problem. He continues to have intermittent episodes of swelling in both legs and subsequent blistering on both legs and feet. Left is worse then right. He has tendency to pick and remove scabs on his legs and feet. His wife helps to clean his wounds daily. He does not usually keep any dressings on his feet unless there is drainage. He denies any fever or chills.  ? ?He has a very extensive vascular surgery history as follows: ?03/21/2019: Right superficial femoral artery stent (claudication) ?04/18/2019: Failed angioplasty, left superficial femoral artery occlusion (claudication) ?11/12/2020: Failed angioplasty, left superficial femoral artery (ulcer) ?11/22/2020: Left femoral to above-knee popliteal artery bypass graft with vein, right superficial femoral artery stent (bilateral ulcers ?01/28/2021: Abdominal aortogram with bilateral runoff ?08/15/2021: Left femoral to below-knee popliteal artery bypass graft with 6 mm PTFE ?08/18/2021: Retrograde peroneal cannulation with right tibioperoneal trunk stent ? ?The pt is not on  a statin for cholesterol management. Managed on Zetia ?The pt is on a daily aspirin.   Other AC:  Plavix, Eliquis  ?The pt is on BB for hypertension.   ?The pt is diabetic.  ?Tobacco hx:  former, 2018 ? ?Past Medical History:  ?Diagnosis Date  ? Anxiety   ? Arthritis   ? Atrial fibrillation ([place])   ? CAD (coronary artery disease)   ? a. 2010: DES to CTO of RCA. EF 55% b. 07/2016: cath showing total occlusion within previously placed RCA stent (collaterals present), severe stenosis along LCx and OM1 (treated with 2 overlapping DES). c. repeat cath in 01/2018 showing patent stents along LCx and OM with CTO of D2, CTO of distal LCx, and CTO of RCA with collaterals present overall unchanged since 2018 with medical management recom  ? Cellulitis and abscess rt groin   ? Complication of anesthesia   ? " I woke up during a colonoscopy "     ? Depression   ? Diabetes mellitus   ? Diastolic CHF ([place])   ? Disorders of iron metabolism   ? Dysrhythmia   ? Fibromyalgia   ? GERD (gastroesophageal reflux disease)   ? History of hiatal hernia   ? Hyperlipidemia   ? Hypertension   ? Low serum testosterone level   ? Medically noncompliant   ? Myocardial infarction [facility])   ? 05-23-20  ? Pneumonia   ? ? ?Past Surgical History:  ?Procedure Laterality Date  ? ABDOMINAL AORTOGRAM W/LOWER EXTREMITY N/A 03/21/2019  ? Procedure: ABDOMINAL AORTOGRAM W/LOWER EXTREMITY;  Surgeon: [name], MD;  Location: [place] [facility];  Service: Cardiovascular;  Laterality: N/A;  ? ABDOMINAL AORTOGRAM W/LOWER EXTREMITY N/A 11/12/2020  ? Procedure: ABDOMINAL AORTOGRAM W/LOWER EXTREMITY;  Surgeon: [name], MD;  Location: [place] [facility];  Service: Cardiovascular;  Laterality: N/A;  ? ABDOMINAL AORTOGRAM W/LOWER EXTREMITY N/A 01/28/2021  ? Procedure: ABDOMINAL AORTOGRAM W/LOWER EXTREMITY;  Surgeon: [name], MD;  Location: [place] [facility];  Service: Cardiovascular;  Laterality: N/A;  ? ABDOMINAL AORTOGRAM W/LOWER EXTREMITY Bilateral  07/29/2021  ? Procedure: ABDOMINAL AORTOGRAM W/LOWER EXTREMITY;  Surgeon: [name], MD;  Location: [place] [facility];  Service: Cardiovascular;  Laterality: Bilateral;  ? ANGIOPLASTY N/A 08/15/2021  ? Procedure: ATTEMPTED RIGHT PERONEAL  ANGIOPLASTY, LEFT PERONEAL ANGIOPLASTY;  Surgeon: [name], MD;  Location: [place];  Service: Vascular;  Laterality: N/A;  ? BACK SURGERY  2015  ? ACDF by Dr. [name]  ? COLONOSCOPY N/A 10/01/2014  ? Dr. [name]: multiple tubular adenomas removed, colonic diverticulosis, redundant colon. next tcs advised for 09/2017. PATIENT NEEDS PROPOFOL FOR FAILED CONSCIOUS SEDATION  ? CORONARY STENT INTERVENTION N/A 07/30/2016  ? Procedure: Coronary Stent Intervention;  Surgeon: [name], MD;  Location: [place] [facility];  Service: Cardiovascular;  Laterality: N/A;  ? CORONARY STENT PLACEMENT  2000  ? By Dr. Olevia Perches  ? EP IMPLANTABLE DEVICE N/A 05/25/2016  ? Procedure: Loop Recorder Insertion;  Surgeon: [name], MD;  Location: [place] [facility];  Service: Cardiovascular;  Laterality: N/A;  ? ESOPHAGOGASTRODUODENOSCOPY    ? esophagus [facility] at [facility]  ? ESOPHAGOGASTRODUODENOSCOPY N/A 10/01/2014  ? Dr. [name]: patchy mottling/erythema and minimal polypoid appearance of gastric mucosa. bx with mild inlammation but no H.pylori  ? FEMORAL-POPLITEAL BYPASS GRAFT Left 11/22/2020  ? Procedure: LEFT FEMORAL-POPLITEAL BYPASS GRAFT;  Surgeon: [name], MD;  Location: [facility] OR;  Service: Vascular;  Laterality: Left;  ? FEMORAL-POPLITEAL BYPASS GRAFT Left 08/15/2021  ? Procedure: REDO LEFT FEMORAL-POPLITEAL BYPASS USING PROPATEN GRAFT;  Surgeon: [name], MD;  Location: [facility] OR;  Service: Vascular;  Laterality: Left;  ? HERNIA REPAIR  3546  ? umbilical  ? INSERTION OF ILIAC STENT Right 11/22/2020  ? Procedure: INSERTION OF ELUVIA STENT INTO RIGHT DISTAL SUPERFICIAL FEMORAL ARTERY;  Surgeon: [name], MD;  Location: [place];  Service: Vascular;  Laterality: Right;  ?  LEFT HEART CATH AND CORONARY ANGIOGRAPHY N/A 07/30/2016  ? Procedure: Left Heart Cath and Coronary Angiography;  Surgeon: [name], MD;  Location: [place] [facility];  Service: Cardiovascular;  Laterality: N/A;  ? LEFT HEART CATH AND CORONARY ANGIOGRAPHY N/A 01/19/2018  ? Procedure: LEFT HEART CATH AND CORONARY ANGIOGRAPHY;  Surgeon: [name], MD;  Location: [place] [facility];  Service: Cardiovascular;  Laterality: N/A;  ? LEFT HEART CATH AND CORONARY ANGIOGRAPHY N/A 05/24/2020  ? Procedure: LEFT HEART CATH AND CORONARY ANGIOGRAPHY;  Surgeon: Burnell Blanks, MD;  Location: [place] [facility];  Service: Cardiovascular;  Laterality: N/A;  ? LEFT HEART CATH AND CORONARY ANGIOGRAPHY N/A 08/06/2021  ? Procedure: LEFT HEART CATH AND CORONARY ANGIOGRAPHY;  Surgeon: Burnell Blanks, MD;  Location: [place] [facility];  Service: Cardiovascular;  Laterality: N/A;  ? LESION REMOVAL    ? Lip and hand   ? LOWER EXTREMITY ANGIOGRAM Right 11/22/2020  ? Procedure: RIGHT LEG ANGIOGRAM;  Surgeon: [name], MD;  Location: [facility] OR;  Service: Vascular;  Laterality: Right;  ? LOWER EXTREMITY ANGIOGRAM Right 08/15/2021  ?  Procedure: RIGHT LOWER EXTREMITY ANGIOGRAM;  Surgeon: [name], MD;  Location: [facility] OR;  Service: Vascular;  Laterality: Right;  ? LOWER EXTREMITY ANGIOGRAPHY N/A 04/18/2019  ? Procedure: LOWER EXTREMITY ANGIOGRAPHY;  Surgeon: [name], MD;  Location: [place] [facility];  Service: Cardiovascular;  Laterality: N/A;  ? LOWER EXTREMITY ANGIOGRAPHY N/A 08/18/2021  ? Procedure: Lower Extremity Angiography;  Surgeon: Waynetta Sandy, MD;  Location: [place] [facility];  Service: Cardiovascular;  Laterality: N/A;  ? NECK SURGERY    ? PERIPHERAL VASCULAR BALLOON ANGIOPLASTY  04/18/2019  ? Procedure: PERIPHERAL VASCULAR BALLOON ANGIOPLASTY;  Surgeon: [name], MD;  Location: [place] [facility];  Service: Cardiovascular;;  ? PERIPHERAL VASCULAR BALLOON ANGIOPLASTY Left  11/12/2020  ? Procedure: PERIPHERAL VASCULAR BALLOON ANGIOPLASTY;  Surgeon: [name], MD;  Location: [place] [facility];  Service: Cardiovascular;  Laterality: Left;  Failed PTA of superficial femoral ar

## 2021-09-22 ENCOUNTER — Ambulatory Visit (INDEPENDENT_AMBULATORY_CARE_PROVIDER_SITE_OTHER)
Admission: RE | Admit: 2021-09-22 | Discharge: 2021-09-22 | Disposition: A | Discharge status: Home / Self Care | Payer: Medicare Other | Source: Ambulatory Visit | Attending: Surgery | Admitting: Surgery

## 2021-09-22 ENCOUNTER — Ambulatory Visit (INDEPENDENT_AMBULATORY_CARE_PROVIDER_SITE_OTHER): Payer: Medicare Other | Admitting: Physician Assistant

## 2021-09-22 ENCOUNTER — Ambulatory Visit (HOSPITAL_COMMUNITY)
Admission: RE | Admit: 2021-09-22 | Discharge: 2021-09-22 | Disposition: A | Discharge status: Home / Self Care | Payer: Medicare Other | Source: Ambulatory Visit | Attending: Surgery | Admitting: Surgery

## 2021-09-22 VITALS — BP 157/79 | HR 77 | Temp 97.3°F | Resp 20 | Ht 74.0 in

## 2021-09-22 DIAGNOSIS — I7025 Atherosclerosis of native arteries of other extremities with ulceration: Secondary | ICD-10-CM | POA: Insufficient documentation

## 2021-09-22 DIAGNOSIS — I739 Peripheral vascular disease, unspecified: Secondary | ICD-10-CM

## 2021-09-22 MED ORDER — CEPHALEXIN 500 MG PO CAPS: 500.0000 mg | ORAL_CAPSULE | Freq: Two times a day (BID) | ORAL | 0 refills | Status: DC

## 2021-09-25 DIAGNOSIS — E1140 Type 2 diabetes mellitus with diabetic neuropathy, unspecified: Secondary | ICD-10-CM | POA: Diagnosis not present

## 2021-09-25 DIAGNOSIS — I70229 Atherosclerosis of native arteries of extremities with rest pain, unspecified extremity: Secondary | ICD-10-CM | POA: Diagnosis not present

## 2021-09-25 DIAGNOSIS — I70203 Unspecified atherosclerosis of native arteries of extremities, bilateral legs: Secondary | ICD-10-CM | POA: Diagnosis not present

## 2021-09-25 DIAGNOSIS — I739 Peripheral vascular disease, unspecified: Secondary | ICD-10-CM | POA: Diagnosis not present

## 2021-09-25 DIAGNOSIS — E119 Type 2 diabetes mellitus without complications: Secondary | ICD-10-CM | POA: Diagnosis not present

## 2021-09-25 DIAGNOSIS — Z48812 Encounter for surgical aftercare following surgery on the circulatory system: Secondary | ICD-10-CM | POA: Diagnosis not present

## 2021-09-29 DIAGNOSIS — I70203 Unspecified atherosclerosis of native arteries of extremities, bilateral legs: Secondary | ICD-10-CM | POA: Diagnosis not present

## 2021-09-30 DIAGNOSIS — I70229 Atherosclerosis of native arteries of extremities with rest pain, unspecified extremity: Secondary | ICD-10-CM | POA: Diagnosis not present

## 2021-09-30 DIAGNOSIS — E1140 Type 2 diabetes mellitus with diabetic neuropathy, unspecified: Secondary | ICD-10-CM | POA: Diagnosis not present

## 2021-09-30 DIAGNOSIS — I739 Peripheral vascular disease, unspecified: Secondary | ICD-10-CM | POA: Diagnosis not present

## 2021-09-30 DIAGNOSIS — Z48812 Encounter for surgical aftercare following surgery on the circulatory system: Secondary | ICD-10-CM | POA: Diagnosis not present

## 2021-09-30 DIAGNOSIS — E119 Type 2 diabetes mellitus without complications: Secondary | ICD-10-CM | POA: Diagnosis not present

## 2021-09-30 DIAGNOSIS — I70203 Unspecified atherosclerosis of native arteries of extremities, bilateral legs: Secondary | ICD-10-CM | POA: Diagnosis not present

## 2021-10-06 ENCOUNTER — Ambulatory Visit (INDEPENDENT_AMBULATORY_CARE_PROVIDER_SITE_OTHER): Payer: Medicare Other | Admitting: Surgery

## 2021-10-06 ENCOUNTER — Encounter: Payer: Self-pay | Admitting: Surgery

## 2021-10-06 VITALS — BP 163/64 | HR 84 | Temp 97.3°F | Resp 20 | Ht 74.0 in | Wt 287.5 lb

## 2021-10-06 DIAGNOSIS — I7025 Atherosclerosis of native arteries of other extremities with ulceration: Secondary | ICD-10-CM

## 2021-10-06 NOTE — Progress Notes (Signed)
[facility]ist [facility]  Patient name: [name] [name] MRN: [id number] DOB: [birth year] Sex: male   REASON FOR VISIT:    Postop  HISOTRY OF PRESENT ILLNESS:    [name] [name] is a 71 y.o. male who is back today for a wound check.  He has undergone the following procedures:  03/21/2019: Right superficial femoral artery stent (claudication) 04/18/2019: Failed angioplasty, left superficial femoral artery occlusion (claudication) 11/12/2020: Failed angioplasty, left superficial femoral artery (ulcer) 11/22/2020: Left femoral to above-knee popliteal artery bypass graft with vein, right superficial femoral artery stent (bilateral ulcers 01/28/2021: Abdominal aortogram with bilateral runoff 08/15/2021: Left femoral to below-knee popliteal artery bypass graft with 6 mm PTFE 08/18/2021: Retrograde peroneal cannulation with right tibioperoneal trunk stent  He is back today for wound check   PAST MEDICAL HISTORY:   Past Medical History:  Diagnosis Date   Anxiety    Arthritis    Atrial fibrillation ([facility])    CAD (coronary artery disease)    a. 2010: DES to CTO of RCA. EF 55% b. 07/2016: cath showing total occlusion within previously placed RCA stent (collaterals present), severe stenosis along LCx and OM1 (treated with 2 overlapping DES). c. repeat cath in 01/2018 showing patent stents along LCx and OM with CTO of D2, CTO of distal LCx, and CTO of RCA with collaterals present overall unchanged since 2018 with medical management recom   Cellulitis and abscess rt groin    Complication of anesthesia    " I woke up during a colonoscopy "      Depression    Diabetes mellitus    Diastolic CHF ([place])    Disorders of iron metabolism    Dysrhythmia    Fibromyalgia    GERD (gastroesophageal reflux disease)    History of hiatal hernia    Hyperlipidemia    Hypertension    Low serum testosterone level    Medically noncompliant    Myocardial infarction  [facility])    05-23-20   Pneumonia      FAMILY HISTORY:   Family History  Problem Relation Age of Onset   Diabetes Father    Valvular heart disease Father    Arthritis Father    Heart disease Father    Stroke Father    Alzheimer's disease Mother    Hyperlipidemia Mother    Hypertension Mother    Arthritis Mother    Lung cancer Mother    Stroke Mother    Headache Mother    Arthritis/Rheumatoid Sister    Diabetes Sister    Hypertension Sister    Hyperlipidemia Sister    Depression Sister    Dementia Maternal Aunt    Dementia Maternal Uncle    Heart disease Maternal Uncle    Stomach cancer Paternal Uncle    Colon cancer Neg Hx    Liver disease Neg Hx     SOCIAL HISTORY:   Social History   Tobacco Use   Smoking status: Former    Packs/day: 0.25    Years: 51.00    Pack years: 12.75    Types: Cigarettes    Quit date: 08/14/2016    Years since quitting: 5.1   Smokeless tobacco: Never   Tobacco comments:    smokes  a pack a week  Substance Use Topics   Alcohol use: No    Alcohol/week: 0.0 standard drinks     ALLERGIES:   Allergies  Allergen Reactions   Shellfish Allergy Anaphylaxis and Other (See Comments)  Tongue swelling, hives    Sulfa Antibiotics Anaphylaxis and Rash    Tongue swelling, hives   Ace Inhibitors Other (See Comments) and Cough    CKD, renal failure    Escitalopram Other (See Comments)    Buzzing in ears,headache, felt like a zombie    Evolocumab Other (See Comments)    Myalgias, flu like sx    Fenofibrate Other (See Comments)    Body aches - pt currently taking isnt sure if its causing any pain    Invokana [Canagliflozin] Other (See Comments)    Syncope / dehydration   Lisinopril Cough   Metformin And Related Itching   Pravastatin Sodium Other (See Comments)    myalgias   Crestor [Rosuvastatin] Other (See Comments)    Myalgias    Horse-Derived Products Rash    horse serum   Lac Bovis Nausea Only    Ties stomach in knots      Lexapro [Escitalopram Oxalate] Other (See Comments)    Buzzing in ears,headache, felt like a zombie   Lipitor [Atorvastatin] Other (See Comments)    myalgias   Livalo [Pitavastatin] Other (See Comments)    Myalgias    Tape Rash     CURRENT MEDICATIONS:   Current Outpatient Medications  Medication Sig Dispense Refill   albuterol (VENTOLIN HFA) 108 (90 Base) MCG/ACT inhaler Inhale 2 puffs into the lungs every 6 (six) hours as needed for wheezing or shortness of breath. 8.5 g 3   amoxicillin (AMOXIL) 500 MG capsule Take 1 capsule (500 mg total) by mouth 3 (three) times daily. 21 capsule 0   apixaban (ELIQUIS) 5 MG TABS tablet Take 1 tablet (5 mg total) by mouth 2 (two) times daily. 180 tablet 1   aspirin EC 81 MG tablet Take 1 tablet (81 mg total) by mouth daily. Swallow whole. 150 tablet 2   cephALEXin (KEFLEX) 500 MG capsule Take 1 capsule (500 mg total) by mouth 2 (two) times daily. 28 capsule 0   Chlorphen-Phenyleph-ASA (ALKA-SELTZER PLUS COLD PO) Take 2 tablets by mouth daily.     clopidogrel (PLAVIX) 75 MG tablet TAKE 1 TABLET BY MOUTH DAILY 30 tablet 3   dapagliflozin propanediol (FARXIGA) 5 MG TABS tablet Take 1 tablet (5 mg total) by mouth daily before breakfast. (Patient taking differently: Take 10 mg by mouth daily before breakfast.) 90 tablet 2   DULoxetine (CYMBALTA) 30 MG capsule TAKE 1 CAPSULE BY MOUTH DAILY 90 capsule 3   DULoxetine (CYMBALTA) 60 MG capsule Take 1 capsule (60 mg total) by mouth daily. (Patient taking differently: Take 60 mg by mouth at bedtime.) 90 capsule 1   ezetimibe (ZETIA) 10 MG tablet Take 1 tablet (10 mg total) by mouth daily. 90 tablet 3   fenofibrate 160 MG tablet Take 1 tablet (160 mg total) by mouth daily. 90 tablet 3   fluticasone (FLONASE) 50 MCG/ACT nasal spray Place 1 spray into both nostrils daily as needed for allergies or rhinitis.     furosemide (LASIX) 20 MG tablet Take Two Times Daily for 7 Days then take Daily (Patient taking  differently: Take 20 mg by mouth daily as needed for fluid. Take Two Times Daily for 7 Days then take Daily) 97 tablet 3   gabapentin (NEURONTIN) 300 MG capsule Take 1 capsule (300 mg total) by mouth 3 (three) times daily. 270 capsule 1   HUMULIN 70/30 (70-30) 100 UNIT/ML injection INJECT 60 UNITS SUBCUTANEOUSLY TWICE DAILY WITH A MEAL 40 mL 2   HYDROcodone-acetaminophen (NORCO) 10-325  MG tablet Take 1 tablet by mouth every 4 (four) hours as needed for moderate pain. 30 tablet 0   insulin lispro (HUMALOG KWIKPEN) 100 UNIT/ML KwikPen Inject 15-20 Units into the skin 3 (three) times daily with meals. (Patient taking differently: Inject 15-20 Units into the skin 3 (three) times daily with meals. Sliding scale) 30 mL 2   Insulin Syringe-Needle U-100 28G X 1/2" 1 ML MISC Use to give insulin five times daily Dx E11.42 500 each 3   isosorbide mononitrate (IMDUR) 30 MG 24 hr tablet TAKE 1 TABLET BY MOUTH DAILY 30 tablet 2   Menthol, Topical Analgesic, (BLUE-EMU MAXIMUM STRENGTH EX) Apply 1 application. topically daily as needed (pain).     metolazone (ZAROXOLYN) 5 MG tablet Take 1 tablet (5 mg total) by mouth daily as needed (Swelling). 30 tablet 3   metoprolol succinate (TOPROL-XL) 50 MG 24 hr tablet Take 1 tablet by mouth once daily 90 tablet 1   metoprolol tartrate (LOPRESSOR) 50 MG tablet Take 1 tablet (50 mg total) by mouth daily as needed (a-fib). 30 tablet 6   MOUNJARO 5 MG/0.5ML Pen INJECT '5MG'$  SUBCUTANEOUSLY ONCE A WEEK (Patient taking differently: Inject 5 mg into the skin once a week. Sunday) 2 mL 10   nitroGLYCERIN (NITROSTAT) 0.4 MG SL tablet DISSOLVE ONE TABLET UNDER THE TONGUE EVERY 5 MINUTES AS NEEDED FOR CHEST PAIN.  DO NOT EXCEED A TOTAL OF 3 DOSES IN 15 MINUTES Strength: 0.4 mg 25 tablet 0   pantoprazole (PROTONIX) 40 MG tablet Take 1 tablet (40 mg total) by mouth daily. 90 tablet 1   traZODone (DESYREL) 100 MG tablet Take 1 tablet (100 mg total) by mouth at bedtime. 90 tablet 1   vitamin  B-12 (CYANOCOBALAMIN) 1000 MCG tablet Take 1,000 mcg by mouth daily.     No current facility-administered medications for this visit.    REVIEW OF SYSTEMS:   '[X]'$  denotes positive finding, '[ ]'$  denotes negative finding Cardiac  Comments:  Chest pain or chest pressure:    Shortness of breath upon exertion:    Short of breath when lying flat:    Irregular heart rhythm:        Vascular    Pain in calf, thigh, or hip brought on by ambulation:    Pain in feet at night that wakes you up from your sleep:     Blood clot in your veins:    Leg swelling:         Pulmonary    Oxygen at home:    Productive cough:     Wheezing:         Neurologic    Sudden weakness in arms or legs:     Sudden numbness in arms or legs:     Sudden onset of difficulty speaking or slurred speech:    Temporary loss of vision in one eye:     Problems with dizziness:         Gastrointestinal    Blood in stool:     Vomited blood:         Genitourinary    Burning when urinating:     Blood in urine:        Psychiatric    Major depression:         Hematologic    Bleeding problems:    Problems with blood clotting too easily:        Skin    Rashes or ulcers:  Constitutional    Fever or chills:      PHYSICAL EXAM:   Vitals:   10/06/21 1043  BP: (!) 163/64  Pulse: 84  Resp: 20  Temp: (!) 97.3 F (36.3 C)  SpO2: 97%  Weight: 287 lb 8 oz (130.4 kg)  Height: '6\' 2"'$  (1.88 m)   Left foot wound was debrided back to healthy tissue.  There was a good granulation tissue base and healthy bleeding.      STUDIES:   None  MEDICAL ISSUES:   Bypass grafts were evaluated 2 weeks ago and were widely patent.  I debrided his left foot wound today and removed the eschar.  There was brisk bleeding underneath.  I had placed a pressure dressing and monitor him for an hour to make sure that the bleeding had resolved.  I will continue with wound care as he is not ready for amputation.  Possibly, these  could auto amputate.  He will return in 2 to 3 weeks for wound check    Annamarie Major, IV, MD, FACS [facility] [phone] Pager [phone]

## 2021-10-08 DIAGNOSIS — I70203 Unspecified atherosclerosis of native arteries of extremities, bilateral legs: Secondary | ICD-10-CM | POA: Diagnosis not present

## 2021-10-08 DIAGNOSIS — I739 Peripheral vascular disease, unspecified: Secondary | ICD-10-CM | POA: Diagnosis not present

## 2021-10-08 DIAGNOSIS — I70229 Atherosclerosis of native arteries of extremities with rest pain, unspecified extremity: Secondary | ICD-10-CM | POA: Diagnosis not present

## 2021-10-08 DIAGNOSIS — Z48812 Encounter for surgical aftercare following surgery on the circulatory system: Secondary | ICD-10-CM | POA: Diagnosis not present

## 2021-10-08 DIAGNOSIS — E119 Type 2 diabetes mellitus without complications: Secondary | ICD-10-CM | POA: Diagnosis not present

## 2021-10-08 DIAGNOSIS — E1140 Type 2 diabetes mellitus with diabetic neuropathy, unspecified: Secondary | ICD-10-CM | POA: Diagnosis not present

## 2021-10-09 ENCOUNTER — Other Ambulatory Visit: Payer: Self-pay | Admitting: Family

## 2021-10-09 DIAGNOSIS — I480 Paroxysmal atrial fibrillation: Secondary | ICD-10-CM

## 2021-10-14 NOTE — Progress Notes (Unsigned)
Referring Provider:Dr. Theador Hawthorne  Primary Care Physician:  [name], FNP Primary Gastroenterologist:  Dr. [name]  No chief complaint on file.   HPI:   [name] [name] is a 71 y.o. male presenting today at the request of Dr. Theador Hawthorne for IDA.   History of PVD with multiple surgical interventions between 2020 and 2023, following with vascular surgery who has offered Transmetatarsal amputation of left foot, but patient has declined. Also with history of PAF, HTN, HLD, diabetes, prior CVA, ILR in place, CAD with stenting (most recent Jan 2022), HF with reduced EF of 40-45%.   Per chart review, appears patient has had mild intermittent anemia with hemoglobin dipping down to the 11-12 range since January 2022.  Intermittent episodes of normalization with hemoglobin improving to the 13-15 range.  Hemoglobin again dipped back down to 11-12 range starting in March 2023. Intermittent microcytic indices.  In January, ferritin low at 24, iron saturation low at 7%, iron low at 31. He received Feraheme x 2 in February.   We have not seen patient since 2020. He was overdue for surveillance colonoscopy at that time.  Patient was initially scheduled for colonoscopy in 2020, but canceled due to COVID-19 pandemic as he was not comfortable going into the hospital.  Last colonoscopy May 2016 with multiple colon polyps removed, redundant colon, colonic diverticulosis.  Pathology with multiple tubular adenomas and 1 hyperplastic polyp.  Recommended 3-year surveillance.  EGD in May 2016 abnormal stomach of uncertain significance s/p biopsy.  Pathology with mild chronic inactive gastritis, negative for H. pylori.   Today:    Past Medical History:  Diagnosis Date   Anxiety    Arthritis    Atrial fibrillation ([place])    CAD (coronary artery disease)    a. 2010: DES to CTO of RCA. EF 55% b. 07/2016: cath showing total occlusion within previously placed RCA stent (collaterals present), severe stenosis along LCx  and OM1 (treated with 2 overlapping DES). c. repeat cath in 01/2018 showing patent stents along LCx and OM with CTO of D2, CTO of distal LCx, and CTO of RCA with collaterals present overall unchanged since 2018 with medical management recom   Cellulitis and abscess rt groin    Complication of anesthesia    " I woke up during a colonoscopy "      Depression    Diabetes mellitus    Diastolic CHF ([place])    Disorders of iron metabolism    Dysrhythmia    Fibromyalgia    GERD (gastroesophageal reflux disease)    History of hiatal hernia    Hyperlipidemia    Hypertension    Low serum testosterone level    Medically noncompliant    Myocardial infarction ([place])    05-23-20   Pneumonia     Past Surgical History:  Procedure Laterality Date   ABDOMINAL AORTOGRAM W/LOWER EXTREMITY N/A 03/21/2019   Procedure: ABDOMINAL AORTOGRAM W/LOWER EXTREMITY;  Surgeon: [name], MD;  Location: [place] [facility];  Service: Cardiovascular;  Laterality: N/A;   ABDOMINAL AORTOGRAM W/LOWER EXTREMITY N/A 11/12/2020   Procedure: ABDOMINAL AORTOGRAM W/LOWER EXTREMITY;  Surgeon: [name], MD;  Location: [place] [facility];  Service: Cardiovascular;  Laterality: N/A;   ABDOMINAL AORTOGRAM W/LOWER EXTREMITY N/A 01/28/2021   Procedure: ABDOMINAL AORTOGRAM W/LOWER EXTREMITY;  Surgeon: [name], MD;  Location: [place] [facility];  Service: Cardiovascular;  Laterality: N/A;   ABDOMINAL AORTOGRAM W/LOWER EXTREMITY Bilateral 07/29/2021   Procedure: ABDOMINAL AORTOGRAM W/LOWER  EXTREMITY;  Surgeon: [name], MD;  Location: [place] [facility];  Service: Cardiovascular;  Laterality: Bilateral;   ANGIOPLASTY N/A 08/15/2021   Procedure: ATTEMPTED RIGHT PERONEAL  ANGIOPLASTY, LEFT PERONEAL ANGIOPLASTY;  Surgeon: [name], MD;  [facility];  Service: Vascular;  Laterality: N/A;   BACK SURGERY  2015   ACDF by Dr. [name]   COLONOSCOPY N/A 10/01/2014   Dr. [name]: multiple tubular adenomas removed,  colonic diverticulosis, redundant colon. next tcs advised for 09/2017. PATIENT NEEDS PROPOFOL FOR FAILED CONSCIOUS SEDATION   CORONARY STENT INTERVENTION N/A 07/30/2016   Procedure: Coronary Stent Intervention;  Surgeon: [name], MD;  Location: [place] [facility];  Service: Cardiovascular;  Laterality: N/A;   CORONARY STENT PLACEMENT  2000   By Dr. Olevia Perches   EP IMPLANTABLE DEVICE N/A 05/25/2016   Procedure: Loop Recorder Insertion;  Surgeon: [name], MD;  Location: [place] [facility];  Service: Cardiovascular;  Laterality: N/A;   ESOPHAGOGASTRODUODENOSCOPY     esophagus [facility] at [facility]   ESOPHAGOGASTRODUODENOSCOPY N/A 10/01/2014   Dr. [name]: patchy mottling/erythema and minimal polypoid appearance of gastric mucosa. bx with mild inlammation but no H.pylori   FEMORAL-POPLITEAL BYPASS GRAFT Left 11/22/2020   Procedure: LEFT FEMORAL-POPLITEAL BYPASS GRAFT;  Surgeon: [name], MD;  [facility];  Service: Vascular;  Laterality: Left;   FEMORAL-POPLITEAL BYPASS GRAFT Left 08/15/2021   Procedure: REDO LEFT FEMORAL-POPLITEAL BYPASS USING PROPATEN GRAFT;  Surgeon: [name], MD;  Location: [place];  Service: Vascular;  Laterality: Left;   HERNIA REPAIR  [id number]   umbilical   INSERTION OF ILIAC STENT Right 11/22/2020   Procedure: INSERTION OF ELUVIA STENT INTO RIGHT DISTAL SUPERFICIAL FEMORAL ARTERY;  Surgeon: [name], MD;  Location: [place];  Service: Vascular;  Laterality: Right;   LEFT HEART CATH AND CORONARY ANGIOGRAPHY N/A 07/30/2016   Procedure: Left Heart Cath and Coronary Angiography;  Surgeon: [name], MD;  Location: [place] [facility];  Service: Cardiovascular;  Laterality: N/A;   LEFT HEART CATH AND CORONARY ANGIOGRAPHY N/A 01/19/2018   Procedure: LEFT HEART CATH AND CORONARY ANGIOGRAPHY;  Surgeon: [name], MD;  Location: [place] [facility];  Service: Cardiovascular;  Laterality: N/A;   LEFT HEART CATH AND CORONARY ANGIOGRAPHY N/A 05/24/2020    Procedure: LEFT HEART CATH AND CORONARY ANGIOGRAPHY;  Surgeon: Burnell Blanks, MD;  Location: [place] [facility];  Service: Cardiovascular;  Laterality: N/A;   LEFT HEART CATH AND CORONARY ANGIOGRAPHY N/A 08/06/2021   Procedure: LEFT HEART CATH AND CORONARY ANGIOGRAPHY;  Surgeon: Burnell Blanks, MD;  Location: [place] [facility];  Service: Cardiovascular;  Laterality: N/A;   LESION REMOVAL     Lip and hand    LOWER EXTREMITY ANGIOGRAM Right 11/22/2020   Procedure: RIGHT LEG ANGIOGRAM;  Surgeon: [name], MD;  [facility];  Service: Vascular;  Laterality: Right;   LOWER EXTREMITY ANGIOGRAM Right 08/15/2021   Procedure: RIGHT LOWER EXTREMITY ANGIOGRAM;  Surgeon: [name], MD;  [facility];  Service: Vascular;  Laterality: Right;   LOWER EXTREMITY ANGIOGRAPHY N/A 04/18/2019   Procedure: LOWER EXTREMITY ANGIOGRAPHY;  Surgeon: [name], MD;  Location: [place] [facility];  Service: Cardiovascular;  Laterality: N/A;   LOWER EXTREMITY ANGIOGRAPHY N/A 08/18/2021   Procedure: Lower Extremity Angiography;  Surgeon: Waynetta Sandy, MD;  Location: [place] [facility];  Service: Cardiovascular;  Laterality: N/A;   NECK SURGERY  PERIPHERAL VASCULAR BALLOON ANGIOPLASTY  04/18/2019   Procedure: PERIPHERAL VASCULAR BALLOON ANGIOPLASTY;  Surgeon: [name], MD;  Location: [place] [facility];  Service: Cardiovascular;;   PERIPHERAL VASCULAR BALLOON ANGIOPLASTY Left 11/12/2020   Procedure: PERIPHERAL VASCULAR BALLOON ANGIOPLASTY;  Surgeon: [name], MD;  Location: [place] [facility];  Service: Cardiovascular;  Laterality: Left;  Failed PTA of superficial femoral artery.   PERIPHERAL VASCULAR BALLOON ANGIOPLASTY Right 08/18/2021   Procedure: PERIPHERAL VASCULAR BALLOON ANGIOPLASTY;  Surgeon: Waynetta Sandy, MD;  Location: [place] [facility];  Service: Cardiovascular;  Laterality: Right;  peroneal   PERIPHERAL VASCULAR INTERVENTION Right  03/21/2019   Procedure: PERIPHERAL VASCULAR INTERVENTION;  Surgeon: [name], MD;  Location: [place] [facility];  Service: Cardiovascular;  Laterality: Right;  superficial femoral   PERIPHERAL VASCULAR INTERVENTION Right 08/18/2021   Procedure: PERIPHERAL VASCULAR INTERVENTION;  Surgeon: Waynetta Sandy, MD;  Location: [place] [facility];  Service: Cardiovascular;  Laterality: Right;  tibial peroneal trunk    Current Outpatient Medications  Medication Sig Dispense Refill   albuterol (VENTOLIN HFA) 108 (90 Base) MCG/ACT inhaler Inhale 2 puffs into the lungs every 6 (six) hours as needed for wheezing or shortness of breath. 8.5 g 3   amoxicillin (AMOXIL) 500 MG capsule Take 1 capsule (500 mg total) by mouth 3 (three) times daily. 21 capsule 0   aspirin EC 81 MG tablet Take 1 tablet (81 mg total) by mouth daily. Swallow whole. 150 tablet 2   cephALEXin (KEFLEX) 500 MG capsule Take 1 capsule (500 mg total) by mouth 2 (two) times daily. 28 capsule 0   Chlorphen-Phenyleph-ASA (ALKA-SELTZER PLUS COLD PO) Take 2 tablets by mouth daily.     clopidogrel (PLAVIX) 75 MG tablet TAKE 1 TABLET BY MOUTH DAILY 30 tablet 3   dapagliflozin propanediol (FARXIGA) 5 MG TABS tablet Take 1 tablet (5 mg total) by mouth daily before breakfast. (Patient taking differently: Take 10 mg by mouth daily before breakfast.) 90 tablet 2   DULoxetine (CYMBALTA) 30 MG capsule TAKE 1 CAPSULE BY MOUTH DAILY 90 capsule 3   DULoxetine (CYMBALTA) 60 MG capsule Take 1 capsule (60 mg total) by mouth daily. (Patient taking differently: Take 60 mg by mouth at bedtime.) 90 capsule 1   ELIQUIS 5 MG TABS tablet TAKE 1 TABLET BY MOUTH TWO TIMES DAILY 60 tablet 2   ezetimibe (ZETIA) 10 MG tablet Take 1 tablet (10 mg total) by mouth daily. 90 tablet 3   fenofibrate 160 MG tablet Take 1 tablet (160 mg total) by mouth daily. 90 tablet 3   fluticasone (FLONASE) 50 MCG/ACT nasal spray Place 1 spray into both nostrils daily as needed for  allergies or rhinitis.     furosemide (LASIX) 20 MG tablet Take Two Times Daily for 7 Days then take Daily (Patient taking differently: Take 20 mg by mouth daily as needed for fluid. Take Two Times Daily for 7 Days then take Daily) 97 tablet 3   gabapentin (NEURONTIN) 300 MG capsule Take 1 capsule (300 mg total) by mouth 3 (three) times daily. 270 capsule 1   HUMULIN 70/30 (70-30) 100 UNIT/ML injection INJECT 60 UNITS SUBCUTANEOUSLY TWICE DAILY WITH A MEAL 40 mL 2   HYDROcodone-acetaminophen (NORCO) 10-325 MG tablet Take 1 tablet by mouth every 4 (four) hours as needed for moderate pain. 30 tablet 0   insulin lispro (HUMALOG) 100 UNIT/ML KwikPen INJECT 15-20 UNITS SUBCUTANEOUSLY THREE TIMES A DAY WITH MEALS 15 mL 0  Insulin Syringe-Needle U-100 28G X 1/2" 1 ML MISC Use to give insulin five times daily Dx E11.42 500 each 3   isosorbide mononitrate (IMDUR) 30 MG 24 hr tablet TAKE 1 TABLET BY MOUTH DAILY 30 tablet 2   Menthol, Topical Analgesic, (BLUE-EMU MAXIMUM STRENGTH EX) Apply 1 application. topically daily as needed (pain).     metolazone (ZAROXOLYN) 5 MG tablet Take 1 tablet (5 mg total) by mouth daily as needed (Swelling). 30 tablet 3   metoprolol succinate (TOPROL-XL) 50 MG 24 hr tablet Take 1 tablet by mouth once daily 90 tablet 1   metoprolol tartrate (LOPRESSOR) 50 MG tablet Take 1 tablet (50 mg total) by mouth daily as needed (a-fib). 30 tablet 6   MOUNJARO 5 MG/0.5ML Pen INJECT '5MG'$  SUBCUTANEOUSLY ONCE A WEEK (Patient taking differently: Inject 5 mg into the skin once a week. Sunday) 2 mL 10   nitroGLYCERIN (NITROSTAT) 0.4 MG SL tablet DISSOLVE ONE TABLET UNDER THE TONGUE EVERY 5 MINUTES AS NEEDED FOR CHEST PAIN.  DO NOT EXCEED A TOTAL OF 3 DOSES IN 15 MINUTES Strength: 0.4 mg 25 tablet 0   pantoprazole (PROTONIX) 40 MG tablet Take 1 tablet (40 mg total) by mouth daily. 90 tablet 1   traZODone (DESYREL) 100 MG tablet Take 1 tablet (100 mg total) by mouth at bedtime. 90 tablet 1   vitamin  B-12 (CYANOCOBALAMIN) 1000 MCG tablet Take 1,000 mcg by mouth daily.     No current facility-administered medications for this visit.    Allergies as of 10/16/2021 - Review Complete 10/06/2021  Allergen Reaction Noted   Shellfish allergy Anaphylaxis and Other (See Comments) 09/06/2014   Sulfa antibiotics Anaphylaxis and Rash 09/06/2014   Ace inhibitors Other (See Comments) and Cough 08/09/2013   Escitalopram Other (See Comments) 01/13/2016   Evolocumab Other (See Comments) 01/04/2017   Fenofibrate Other (See Comments) 11/14/2015   Invokana [canagliflozin] Other (See Comments) 09/04/2013   Lisinopril Cough 10/04/2014   Metformin and related Itching 07/12/2013   Pravastatin sodium Other (See Comments) 11/14/2014   Crestor [rosuvastatin] Other (See Comments) 06/05/2013   Horse-derived products Rash 07/25/2008   Lac bovis Nausea Only 04/06/2019   Lexapro [escitalopram oxalate] Other (See Comments) 01/13/2016   Lipitor [atorvastatin] Other (See Comments) 06/05/2013   Livalo [pitavastatin] Other (See Comments) 06/25/2016   Tape Rash 07/25/2008    Family History  Problem Relation Age of Onset   Diabetes Father    Valvular heart disease Father    Arthritis Father    Heart disease Father    Stroke Father    Alzheimer's disease Mother    Hyperlipidemia Mother    Hypertension Mother    Arthritis Mother    Lung cancer Mother    Stroke Mother    Headache Mother    Arthritis/Rheumatoid Sister    Diabetes Sister    Hypertension Sister    Hyperlipidemia Sister    Depression Sister    Dementia Maternal Aunt    Dementia Maternal Uncle    Heart disease Maternal Uncle    Stomach cancer Paternal Uncle    Colon cancer Neg Hx    Liver disease Neg Hx     Social History   Socioeconomic History   Marital status: Married    Spouse name: Belenda Cruise   Number of children: 2   Years of education: 12   Highest education level: 12th grade  Occupational History   Occupation: retired   Tobacco Use   Smoking status: Former    Packs/day:  0.25    Years: 51.00    Pack years: 12.75    Types: Cigarettes    Quit date: 08/14/2016    Years since quitting: 5.1   Smokeless tobacco: Never   Tobacco comments:    smokes  a pack a week  Vaping Use   Vaping Use: Never used  Substance and Sexual Activity   Alcohol use: No    Alcohol/week: 0.0 standard drinks   Drug use: No   Sexual activity: Yes  Other Topics Concern   Not on file  Social History Narrative   Lives with his wife.  Retired.  Unable to afford expensive medicines.     He has 2 children from previous marriage - they live in [place]       Caffeine: 1 cup of 1/2 caff coffee in AM, drinks more if at a restaurant    Social Determinants of Health   Financial Resource Strain: Medium Risk   Difficulty of Paying Living Expenses: Somewhat hard  Food Insecurity: No Food Insecurity   Worried About Charity fundraiser in the Last Year: Never true   Ran Out of Food in the Last Year: Never true  Transportation Needs: No Transportation Needs   Lack of Transportation (Medical): No   Lack of Transportation (Non-Medical): No  Physical Activity: Insufficiently Active   Days of Exercise per Week: 7 days   Minutes of Exercise per Session: 10 min  Stress: No Stress Concern Present   Feeling of Stress : Only a little  Social Connections: Moderately Isolated   Frequency of Communication with Friends and Family: More than three times a week   Frequency of Social Gatherings with Friends and Family: Twice a week   Attends Religious Services: Never   Marine scientist or Organizations: No   Attends Music therapist: Never   Marital Status: Married  Human resources officer Violence: Not At Risk   Fear of Current or Ex-Partner: No   Emotionally Abused: No   Physically Abused: No   Sexually Abused: No    Review of Systems: Gen: Denies any fever, chills, fatigue, weight loss, lack of appetite.  CV: Denies chest  pain, heart palpitations, peripheral edema, syncope.  Resp: Denies shortness of breath at rest or with exertion. Denies wheezing or cough.  GI: Denies dysphagia or odynophagia. Denies jaundice, hematemesis, fecal incontinence. GU : Denies urinary burning, urinary frequency, urinary hesitancy MS: Denies joint pain, muscle weakness, cramps, or limitation of movement.  Derm: Denies rash, itching, dry skin Psych: Denies depression, anxiety, memory loss, and confusion Heme: See HPI  Physical Exam: There were no vitals taken for this visit. General:   Alert and oriented. Pleasant and cooperative. Well-nourished and well-developed.  Head:  Normocephalic and atraumatic. Eyes:  Without icterus, sclera clear and conjunctiva pink.  Ears:  Normal auditory acuity. Lungs:  Clear to auscultation bilaterally. No wheezes, rales, or rhonchi. No distress.  Heart:  S1, S2 present without murmurs appreciated.  Abdomen:  +BS, soft, non-tender and non-distended. No HSM noted. No guarding or rebound. No masses appreciated.  Rectal:  Deferred  Msk:  Symmetrical without gross deformities. Normal posture. Extremities:  Without edema. Neurologic:  Alert and  oriented x4;  grossly normal neurologically. Skin:  Intact without significant lesions or rashes. Psych:  Normal mood and affect.    Assessment:     Plan:  ***   Aliene Altes, PA-C [facility] 10/16/2021

## 2021-10-15 ENCOUNTER — Other Ambulatory Visit: Payer: Self-pay | Admitting: Family

## 2021-10-16 ENCOUNTER — Ambulatory Visit (INDEPENDENT_AMBULATORY_CARE_PROVIDER_SITE_OTHER): Payer: Medicare Other | Admitting: Gastroenterology

## 2021-10-16 ENCOUNTER — Telehealth: Payer: Self-pay | Admitting: *Deleted

## 2021-10-16 ENCOUNTER — Encounter: Payer: Self-pay | Admitting: Gastroenterology

## 2021-10-16 VITALS — BP 126/70 | HR 94 | Temp 97.7°F | Ht 74.0 in | Wt 282.6 lb

## 2021-10-16 DIAGNOSIS — Z86010 Personal history of colonic polyps: Secondary | ICD-10-CM | POA: Diagnosis not present

## 2021-10-16 DIAGNOSIS — D509 Iron deficiency anemia, unspecified: Secondary | ICD-10-CM

## 2021-10-16 NOTE — Telephone Encounter (Signed)
Clinical pharmacist to review Eliquis 

## 2021-10-16 NOTE — Patient Instructions (Signed)
We will arrange you to have a colonoscopy and possible upper endoscopy in the near future with Dr. [name]. 1 day prior to procedure: Take one half dose of Humulin (30 units in the morning and evening).  Take your other diabetes medications as prescribed.  You can use your sliding scale insulin as needed. Day of procedure: Do not take any morning diabetes medications.  We will reach out to your cardiologist to get approval to hold Eliquis for 2 days prior to your procedure.  We will follow-up with you in the office after your procedures.  Do not hesitate to call if you have any questions or concerns prior to your next visit.  It was great to meet you today!  Aliene Altes, PA-C [facility]

## 2021-10-16 NOTE — Telephone Encounter (Signed)
Sent medication clearance. Waiting for approval for procedure.  

## 2021-10-16 NOTE — Telephone Encounter (Signed)
Attention: Preop   We would like to request holding the following medication for patient please.  Procedure: Colonoscopy and Upper Endoscopy   Date: TBD  Medication to hold: Eliquis for 48 hours prior to procedure   Surgeon: Dr.Rourk   Phone: [phone]  Fax:  [phone]  Type of Anesthesia: Propofol  ASA III

## 2021-10-17 DIAGNOSIS — I739 Peripheral vascular disease, unspecified: Secondary | ICD-10-CM | POA: Diagnosis not present

## 2021-10-17 DIAGNOSIS — E119 Type 2 diabetes mellitus without complications: Secondary | ICD-10-CM | POA: Diagnosis not present

## 2021-10-17 DIAGNOSIS — Z48812 Encounter for surgical aftercare following surgery on the circulatory system: Secondary | ICD-10-CM | POA: Diagnosis not present

## 2021-10-17 DIAGNOSIS — I70203 Unspecified atherosclerosis of native arteries of extremities, bilateral legs: Secondary | ICD-10-CM | POA: Diagnosis not present

## 2021-10-17 DIAGNOSIS — E1140 Type 2 diabetes mellitus with diabetic neuropathy, unspecified: Secondary | ICD-10-CM | POA: Diagnosis not present

## 2021-10-17 DIAGNOSIS — I70229 Atherosclerosis of native arteries of extremities with rest pain, unspecified extremity: Secondary | ICD-10-CM | POA: Diagnosis not present

## 2021-10-17 NOTE — Telephone Encounter (Signed)
Patient with diagnosis of afib on Eliquis for anticoagulation.    Procedure: colonoscopy and endoscopy Date of procedure: TBD  CHA2DS2-VASc Score = 7  This indicates a 11.2% annual risk of stroke. The patient's score is based upon: CHF History: 1 HTN History: 1 Diabetes History: 1 Stroke History: 2 Vascular Disease History: 1 Age Score: 1 Gender Score: 0  CrCl 15m/min using adjusted body weight due to obesity Platelet count 274K  Per office protocol, recommend patient only hold Eliquis for 1 day prior to procedure. He should resume as soon as safely possible after due to elevated CV risk.

## 2021-10-17 NOTE — Telephone Encounter (Signed)
    Patient Name: [name] [name]  DOB: 1950-06-01 MRN: [id number]  Primary Cardiologist: Jenkins Rouge, MD  Chart reviewed as part of pre-operative protocol coverage. Given past medical history and time since last visit, based on ACC/AHA guidelines, [name] would be at acceptable risk for the planned procedure without further cardiovascular testing.   Per office protocol, recommend patient only hold Eliquis for 1 day prior to procedure. He should resume as soon as safely possible after due to elevated CV risk.  Please call with questions.  Mable Fill, Marissa Nestle, NP 10/17/2021, 12:41 PM

## 2021-10-20 ENCOUNTER — Ambulatory Visit (INDEPENDENT_AMBULATORY_CARE_PROVIDER_SITE_OTHER): Payer: Medicare Other | Admitting: Surgery

## 2021-10-20 ENCOUNTER — Telehealth: Payer: Self-pay | Admitting: *Deleted

## 2021-10-20 DIAGNOSIS — I7025 Atherosclerosis of native arteries of other extremities with ulceration: Secondary | ICD-10-CM

## 2021-10-20 DIAGNOSIS — G894 Chronic pain syndrome: Secondary | ICD-10-CM | POA: Diagnosis not present

## 2021-10-20 DIAGNOSIS — Z79891 Long term (current) use of opiate analgesic: Secondary | ICD-10-CM | POA: Diagnosis not present

## 2021-10-20 DIAGNOSIS — Z5181 Encounter for therapeutic drug level monitoring: Secondary | ICD-10-CM | POA: Diagnosis not present

## 2021-10-20 DIAGNOSIS — M5136 Other intervertebral disc degeneration, lumbar region: Secondary | ICD-10-CM | POA: Diagnosis not present

## 2021-10-20 DIAGNOSIS — M47816 Spondylosis without myelopathy or radiculopathy, lumbar region: Secondary | ICD-10-CM | POA: Diagnosis not present

## 2021-10-20 NOTE — Telephone Encounter (Signed)
   Pre-operative Risk Assessment    Patient Name: [name] [name]  DOB: 08/06/1950 MRN: [id number]      Request for Surgical Clearance    Procedure:   LEFT L4-5, L5-S1 TFESI  Date of Surgery:  Clearance 12/19/21                                 Surgeon:  DR. O'TOOLE Surgeon's Group or Practice Name:  [facility] number:  [phone] Fax number:  [phone]   Type of Clearance Requested:   - Medical  - Pharmacy:  Hold Clopidogrel (Plavix) x 7 DAYS PRIOR TO PROCEDURE   Type of Anesthesia:  Not Indicated   Additional requests/questions:    [name]   10/20/2021, 5:41 PM

## 2021-10-20 NOTE — Telephone Encounter (Signed)
Provider is requesting clarification if the patient can hold Eliquis for 1 day or is it ok to hold for 2 days as requested.

## 2021-10-20 NOTE — Progress Notes (Signed)
Patient name: [name] [name] MRN: [id number] DOB: 1950/11/09 Sex: male  REASON FOR VISIT:     Wound check  HISTORY OF PRESENT ILLNESS:   [name] [name] is a 71 y.o. male who is back today for a wound check.   He has undergone the following procedures:   03/21/2019: Right superficial femoral artery stent (claudication) 04/18/2019: Failed angioplasty, left superficial femoral artery occlusion (claudication) 11/12/2020: Failed angioplasty, left superficial femoral artery (ulcer) 11/22/2020: Left femoral to above-knee popliteal artery bypass graft with vein, right superficial femoral artery stent (bilateral ulcers 01/28/2021: Abdominal aortogram with bilateral runoff 08/15/2021: Left femoral to below-knee popliteal artery bypass graft with 6 mm PTFE 08/18/2021: Retrograde peroneal cannulation with right tibioperoneal trunk stent  I debrided his wound at his last visit.  He is back for a wound check.  CURRENT MEDICATIONS:    Current Outpatient Medications  Medication Sig Dispense Refill   albuterol (VENTOLIN HFA) 108 (90 Base) MCG/ACT inhaler Inhale 2 puffs into the lungs every 6 (six) hours as needed for wheezing or shortness of breath. 8.5 g 3   amoxicillin (AMOXIL) 500 MG capsule Take 1 capsule (500 mg total) by mouth 3 (three) times daily. (Patient not taking: Reported on 10/16/2021) 21 capsule 0   aspirin EC 81 MG tablet Take 1 tablet (81 mg total) by mouth daily. Swallow whole. 150 tablet 2   cephALEXin (KEFLEX) 500 MG capsule Take 1 capsule (500 mg total) by mouth 2 (two) times daily. (Patient not taking: Reported on 10/16/2021) 28 capsule 0   Chlorphen-Phenyleph-ASA (ALKA-SELTZER PLUS COLD PO) Take 2 tablets by mouth daily.     clopidogrel (PLAVIX) 75 MG tablet TAKE 1 TABLET BY MOUTH DAILY 30 tablet 3   dapagliflozin propanediol (FARXIGA) 5 MG TABS tablet Take 1 tablet (5 mg total) by mouth daily before breakfast. (Patient taking differently: Take 10 mg by mouth  daily before breakfast.) 90 tablet 2   DULoxetine (CYMBALTA) 30 MG capsule TAKE 1 CAPSULE BY MOUTH DAILY 90 capsule 3   ELIQUIS 5 MG TABS tablet TAKE 1 TABLET BY MOUTH TWO TIMES DAILY 60 tablet 2   ezetimibe (ZETIA) 10 MG tablet Take 1 tablet (10 mg total) by mouth daily. 90 tablet 3   fenofibrate 160 MG tablet Take 1 tablet (160 mg total) by mouth daily. 90 tablet 3   fluticasone (FLONASE) 50 MCG/ACT nasal spray Place 1 spray into both nostrils daily as needed for allergies or rhinitis.     gabapentin (NEURONTIN) 300 MG capsule Take 1 capsule (300 mg total) by mouth 3 (three) times daily. 270 capsule 1   HUMULIN 70/30 (70-30) 100 UNIT/ML injection INJECT 60 UNITS SUBCUTANEOUSLY TWICE DAILY WITH A MEAL 40 mL 2   HYDROcodone-acetaminophen (NORCO) 10-325 MG tablet Take 1 tablet by mouth every 4 (four) hours as needed for moderate pain. 30 tablet 0   insulin lispro (HUMALOG) 100 UNIT/ML KwikPen INJECT 15-20 UNITS SUBCUTANEOUSLY THREE TIMES A DAY WITH MEALS 15 mL 0   Insulin Syringe-Needle U-100 28G X 1/2" 1 ML MISC Use to give insulin five times daily Dx E11.42 500 each 3   isosorbide mononitrate (IMDUR) 30 MG 24 hr tablet TAKE 1 TABLET BY MOUTH DAILY 30 tablet 2   Menthol, Topical Analgesic, (BLUE-EMU MAXIMUM STRENGTH EX) Apply 1 application. topically daily as needed (pain).     metolazone (ZAROXOLYN) 5 MG tablet Take 1 tablet (5 mg total) by mouth daily as needed (Swelling). 30 tablet 3   metoprolol succinate (TOPROL-XL) 50  MG 24 hr tablet Take 1 tablet by mouth once daily 90 tablet 1   metoprolol tartrate (LOPRESSOR) 50 MG tablet Take 1 tablet (50 mg total) by mouth daily as needed (a-fib). 30 tablet 6   MOUNJARO 5 MG/0.5ML Pen INJECT '5MG'$  SUBCUTANEOUSLY ONCE A WEEK (Patient taking differently: Inject 5 mg into the skin once a week. Sunday) 2 mL 10   nitroGLYCERIN (NITROSTAT) 0.4 MG SL tablet DISSOLVE ONE TABLET UNDER THE TONGUE EVERY 5 MINUTES AS NEEDED FOR CHEST PAIN.  DO NOT EXCEED A TOTAL OF 3  DOSES IN 15 MINUTES Strength: 0.4 mg 25 tablet 0   pantoprazole (PROTONIX) 40 MG tablet Take 1 tablet (40 mg total) by mouth daily. 90 tablet 1   traZODone (DESYREL) 100 MG tablet Take 1 tablet (100 mg total) by mouth at bedtime. 90 tablet 1   vitamin B-12 (CYANOCOBALAMIN) 1000 MCG tablet Take 1,000 mcg by mouth daily.     No current facility-administered medications for this visit.    REVIEW OF SYSTEMS:   X denotes positive finding, denotes negative finding Cardiac  Comments:  Chest pain or chest pressure:    Shortness of breath upon exertion:    Short of breath when lying flat:    Irregular heart rhythm:    Constitutional    Fever or chills:      PHYSICAL EXAM:   BP  160/78 P 94 R 20, 96% T 97.2  GENERAL: The patient is a well-nourished male, in no acute distress. The vital signs are documented above. CARDIOVASCULAR: There is a regular rate and rhythm. PULMONARY: Non-labored respirations The overall appearance of the wound is better  STUDIES:      MEDICAL ISSUES:   F/u 3-4 weeks for wound check Con't BID wet to dry dressing changes Remains at risk for TMA, but trying to see if his necrotic tissue will auto-amputate at patient's request  [facility] [phone] Pager [phone]

## 2021-10-21 ENCOUNTER — Other Ambulatory Visit: Payer: Self-pay | Admitting: Family

## 2021-10-21 DIAGNOSIS — I70203 Unspecified atherosclerosis of native arteries of extremities, bilateral legs: Secondary | ICD-10-CM | POA: Diagnosis not present

## 2021-10-21 NOTE — Telephone Encounter (Signed)
Received medication clearance. Copy placed on providers desk.  

## 2021-10-21 NOTE — Telephone Encounter (Signed)
Thank you for clarification.

## 2021-10-22 DIAGNOSIS — I5042 Chronic combined systolic (congestive) and diastolic (congestive) heart failure: Secondary | ICD-10-CM | POA: Diagnosis not present

## 2021-10-22 DIAGNOSIS — R809 Proteinuria, unspecified: Secondary | ICD-10-CM | POA: Diagnosis not present

## 2021-10-22 DIAGNOSIS — N189 Chronic kidney disease, unspecified: Secondary | ICD-10-CM | POA: Diagnosis not present

## 2021-10-22 DIAGNOSIS — I129 Hypertensive chronic kidney disease with stage 1 through stage 4 chronic kidney disease, or unspecified chronic kidney disease: Secondary | ICD-10-CM | POA: Diagnosis not present

## 2021-10-22 DIAGNOSIS — E1129 Type 2 diabetes mellitus with other diabetic kidney complication: Secondary | ICD-10-CM | POA: Diagnosis not present

## 2021-10-22 DIAGNOSIS — E1122 Type 2 diabetes mellitus with diabetic chronic kidney disease: Secondary | ICD-10-CM | POA: Diagnosis not present

## 2021-10-22 DIAGNOSIS — I739 Peripheral vascular disease, unspecified: Secondary | ICD-10-CM | POA: Diagnosis not present

## 2021-10-22 NOTE — Telephone Encounter (Signed)
    Name: [name] [name]  DOB: Aug 04, 1950  MRN: [id number]  Primary Cardiologist: Jenkins Rouge, MD   Preoperative team, please contact this patient and set up a phone call appointment for further preoperative risk assessment. Please obtain consent and complete medication review. Thank you for your help.  I confirm that guidance regarding antiplatelet and oral anticoagulation therapy has been completed and, if necessary, noted below.  Patient with diagnosis of afib on Eliquis for anticoagulation.     Procedure: LEFT L4-5, L5-S1 TFESI Date of procedure: 12/19/21   CHA2DS2-VASc Score = 7  This indicates a 11.2% annual risk of stroke. The patient's score is based upon: CHF History: 1 HTN History: 1 Diabetes History: 1 Stroke History: 2 Vascular Disease History: 1 Age Score: 1 Gender Score: 0   CrCl 52m/min using adjusted body weight due to obesity Platelet count 274K   Previously cleared by Dr NJohnsie Cancel6/20/22 note for 3 day hold for previous procedure without bridging. Per office protocol, patient can hold Eliquis for 3 days prior to procedure. He should resume as soon as safely possible after procedure given elevated CV risk.  Patient's Plavix is prescribed by vascular surgery. Therefore, recommendations for holding this medication should come from the prescribing provider.  ELenna Sciara NP 10/22/2021, 3:58 PM [email or web address] [id number]

## 2021-10-22 NOTE — Telephone Encounter (Signed)
Patient with diagnosis of afib on Eliquis for anticoagulation.    Procedure: LEFT L4-5, L5-S1 TFESI Date of procedure: 12/19/21  CHA2DS2-VASc Score = 7  This indicates a 11.2% annual risk of stroke. The patient's score is based upon: CHF History: 1 HTN History: 1 Diabetes History: 1 Stroke History: 2 Vascular Disease History: 1 Age Score: 1 Gender Score: 0   CrCl 60m/min using adjusted body weight due to obesity Platelet count 274K  Previously cleared by Dr NJohnsie Cancel6/20/22 note for 3 day hold for previous procedure without bridging. Per office protocol, patient can hold Eliquis for 3 days prior to procedure. He should resume as soon as safely possible after procedure given elevated CV risk.

## 2021-10-22 NOTE — Telephone Encounter (Signed)
I will need to review with Dr. [name] when he returns next week.

## 2021-10-23 ENCOUNTER — Ambulatory Visit (INDEPENDENT_AMBULATORY_CARE_PROVIDER_SITE_OTHER): Payer: Medicare Other | Admitting: Family Medicine

## 2021-10-23 ENCOUNTER — Encounter: Payer: Self-pay | Admitting: Family Medicine

## 2021-10-23 VITALS — BP 161/79 | HR 100 | Temp 98.0°F | Ht 74.0 in | Wt 285.0 lb

## 2021-10-23 DIAGNOSIS — E119 Type 2 diabetes mellitus without complications: Secondary | ICD-10-CM | POA: Diagnosis not present

## 2021-10-23 DIAGNOSIS — I739 Peripheral vascular disease, unspecified: Secondary | ICD-10-CM | POA: Diagnosis not present

## 2021-10-23 DIAGNOSIS — I70229 Atherosclerosis of native arteries of extremities with rest pain, unspecified extremity: Secondary | ICD-10-CM | POA: Diagnosis not present

## 2021-10-23 DIAGNOSIS — I70203 Unspecified atherosclerosis of native arteries of extremities, bilateral legs: Secondary | ICD-10-CM | POA: Diagnosis not present

## 2021-10-23 DIAGNOSIS — N3001 Acute cystitis with hematuria: Secondary | ICD-10-CM | POA: Diagnosis not present

## 2021-10-23 DIAGNOSIS — Z48812 Encounter for surgical aftercare following surgery on the circulatory system: Secondary | ICD-10-CM | POA: Diagnosis not present

## 2021-10-23 DIAGNOSIS — E1140 Type 2 diabetes mellitus with diabetic neuropathy, unspecified: Secondary | ICD-10-CM | POA: Diagnosis not present

## 2021-10-23 DIAGNOSIS — R300 Dysuria: Secondary | ICD-10-CM | POA: Diagnosis not present

## 2021-10-23 DIAGNOSIS — N481 Balanitis: Secondary | ICD-10-CM

## 2021-10-23 LAB — MICROSCOPIC EXAMINATION

## 2021-10-23 LAB — URINALYSIS, ROUTINE W REFLEX MICROSCOPIC
Bilirubin, UA: NEGATIVE
Ketones, UA: NEGATIVE
Nitrite, UA: NEGATIVE
Specific Gravity, UA: 1.025 (ref 1.005–1.030)
Urobilinogen, Ur: 4 mg/dL — ABNORMAL HIGH (ref 0.2–1.0)
pH, UA: 6 (ref 5.0–7.5)

## 2021-10-23 MED ORDER — NYSTATIN 100000 UNIT/GM EX CREA: 1.0000 "application " | TOPICAL_CREAM | Freq: Two times a day (BID) | CUTANEOUS | 0 refills | Status: DC

## 2021-10-23 MED ORDER — AMOXICILLIN-POT CLAVULANATE 875-125 MG PO TABS: 1.0000 | ORAL_TABLET | Freq: Two times a day (BID) | ORAL | 0 refills | Status: AC

## 2021-10-23 NOTE — Progress Notes (Signed)
Subjective:  Patient ID: [name] [name], male    DOB: [birth year], 71 y.o.   MRN: [id number]  Patient Care Team: [name], FNP as PCP - General (Family Medicine) [name], MD as PCP - Electrophysiology (Cardiology) Josue Hector, MD as PCP - Cardiology (Cardiology) [name], MD as Consulting Physician (Vascular Surgery) [name] Cristopher Estimable, MD as Consulting Physician (Gastroenterology) Melvenia Beam, MD as Consulting Physician (Neurology) Lavera Guise, [facility] (Pharmacist) Dorene Ar, MD (Pain Medicine) Liana Gerold, MD as Consulting Physician (Nephrology) Celestia Khat, [facility] (Optometry) [place], Rowan Blase, PTA as Physical Therapy Assistant (Orthopedic Surgery)   Chief Complaint:  Dysuria (Pain, burning, odor, discoloration)   HPI: NICKLAUS ALVIAR is a 71 y.o. male presenting on 10/23/2021 for Dysuria (Pain, burning, odor, discoloration)   Dysuria  This is a new problem. The current episode started yesterday. The problem occurs every urination. The problem has been gradually worsening. The quality of the pain is described as burning. There has been no fever. He is Not sexually active. There is No history of pyelonephritis. Associated symptoms include frequency and urgency. Pertinent negatives include no chills, discharge, flank pain, hematuria, hesitancy, nausea, possible pregnancy, sweats or vomiting. He has tried nothing for the symptoms. The treatment provided no relief.    Relevant past medical, surgical, family, and social history reviewed and updated as indicated.  Allergies and medications reviewed and updated. Data reviewed: Chart in Epic.   Past Medical History:  Diagnosis Date   Anxiety    Arthritis    Atrial fibrillation (HCC)    CAD (coronary artery disease)    a. 2010: DES to CTO of RCA. EF 55% b. 07/2016: cath showing total occlusion within previously placed RCA stent (collaterals present), severe stenosis along LCx and OM1 (treated  with 2 overlapping DES). c. repeat cath in 01/2018 showing patent stents along LCx and OM with CTO of D2, CTO of distal LCx, and CTO of RCA with collaterals present overall unchanged since 2018 with medical management recom   Cellulitis and abscess rt groin    Complication of anesthesia    " I woke up during a colonoscopy "      Depression    Diabetes mellitus    Diastolic CHF ([place])    Disorders of iron metabolism    Dysrhythmia    Fibromyalgia    GERD (gastroesophageal reflux disease)    History of hiatal hernia    Hyperlipidemia    Hypertension    Low serum testosterone level    Medically noncompliant    Myocardial infarction ([place])    05-23-20   Pneumonia     Past Surgical History:  Procedure Laterality Date   ABDOMINAL AORTOGRAM W/LOWER EXTREMITY N/A 03/21/2019   Procedure: ABDOMINAL AORTOGRAM W/LOWER EXTREMITY;  Surgeon: [name], MD;  Location: [facility];  Service: Cardiovascular;  Laterality: N/A;   ABDOMINAL AORTOGRAM W/LOWER EXTREMITY N/A 11/12/2020   Procedure: ABDOMINAL AORTOGRAM W/LOWER EXTREMITY;  Surgeon: [name], MD;  Location: [facility];  Service: Cardiovascular;  Laterality: N/A;   ABDOMINAL AORTOGRAM W/LOWER EXTREMITY N/A 01/28/2021   Procedure: ABDOMINAL AORTOGRAM W/LOWER EXTREMITY;  Surgeon: [name], MD;  Location: [facility];  Service: Cardiovascular;  Laterality: N/A;   ABDOMINAL AORTOGRAM W/LOWER EXTREMITY Bilateral 07/29/2021   Procedure: ABDOMINAL AORTOGRAM W/LOWER EXTREMITY;  Surgeon: [name], MD;  Location: [facility];  Service: Cardiovascular;  Laterality: Bilateral;  ANGIOPLASTY N/A 08/15/2021   Procedure: ATTEMPTED RIGHT PERONEAL  ANGIOPLASTY, LEFT PERONEAL ANGIOPLASTY;  Surgeon: [name], MD;  [facility];  Service: Vascular;  Laterality: N/A;   BACK SURGERY  2015   ACDF by Dr. [name]   COLONOSCOPY N/A 10/01/2014   Dr. [name]: multiple tubular adenomas removed, colonic  diverticulosis, redundant colon. next tcs advised for 09/2017. PATIENT NEEDS PROPOFOL FOR FAILED CONSCIOUS SEDATION   CORONARY STENT INTERVENTION N/A 07/30/2016   Procedure: Coronary Stent Intervention;  Surgeon: [name], MD;  Location: [facility];  Service: Cardiovascular;  Laterality: N/A;   CORONARY STENT PLACEMENT  2000   By Dr. Olevia Perches   EP IMPLANTABLE DEVICE N/A 05/25/2016   Procedure: Loop Recorder Insertion;  Surgeon: [name], MD;  Location: [facility];  Service: Cardiovascular;  Laterality: N/A;   ESOPHAGOGASTRODUODENOSCOPY     esophagus stretched remotely at [facility]   ESOPHAGOGASTRODUODENOSCOPY N/A 10/01/2014   Dr. [name]: patchy mottling/erythema and minimal polypoid appearance of gastric mucosa. bx with mild inlammation but no H.pylori   FEMORAL-POPLITEAL BYPASS GRAFT Left 11/22/2020   Procedure: LEFT FEMORAL-POPLITEAL BYPASS GRAFT;  Surgeon: [name], MD;  [facility];  Service: Vascular;  Laterality: Left;   FEMORAL-POPLITEAL BYPASS GRAFT Left 08/15/2021   Procedure: REDO LEFT FEMORAL-POPLITEAL BYPASS USING PROPATEN GRAFT;  Surgeon: [name], MD;  Location: [facility];  Service: Vascular;  Laterality: Left;   HERNIA REPAIR  [id number]   umbilical   INSERTION OF ILIAC STENT Right 11/22/2020   Procedure: INSERTION OF ELUVIA STENT INTO RIGHT DISTAL SUPERFICIAL FEMORAL ARTERY;  Surgeon: [name], MD;  Location: [facility];  Service: Vascular;  Laterality: Right;   LEFT HEART CATH AND CORONARY ANGIOGRAPHY N/A 07/30/2016   Procedure: Left Heart Cath and Coronary Angiography;  Surgeon: [name], MD;  Location: [facility];  Service: Cardiovascular;  Laterality: N/A;   LEFT HEART CATH AND CORONARY ANGIOGRAPHY N/A 01/19/2018   Procedure: LEFT HEART CATH AND CORONARY ANGIOGRAPHY;  Surgeon: [name], MD;  Location: [facility];  Service: Cardiovascular;  Laterality: N/A;   LEFT HEART CATH AND CORONARY ANGIOGRAPHY N/A 05/24/2020    Procedure: LEFT HEART CATH AND CORONARY ANGIOGRAPHY;  Surgeon: Burnell Blanks, MD;  Location: [facility];  Service: Cardiovascular;  Laterality: N/A;   LEFT HEART CATH AND CORONARY ANGIOGRAPHY N/A 08/06/2021   Procedure: LEFT HEART CATH AND CORONARY ANGIOGRAPHY;  Surgeon: Burnell Blanks, MD;  Location: [facility];  Service: Cardiovascular;  Laterality: N/A;   LESION REMOVAL     Lip and hand    LOWER EXTREMITY ANGIOGRAM Right 11/22/2020   Procedure: RIGHT LEG ANGIOGRAM;  Surgeon: [name], MD;  [facility];  Service: Vascular;  Laterality: Right;   LOWER EXTREMITY ANGIOGRAM Right 08/15/2021   Procedure: RIGHT LOWER EXTREMITY ANGIOGRAM;  Surgeon: [name], MD;  [facility];  Service: Vascular;  Laterality: Right;   LOWER EXTREMITY ANGIOGRAPHY N/A 04/18/2019   Procedure: LOWER EXTREMITY ANGIOGRAPHY;  Surgeon: [name], MD;  Location: [facility];  Service: Cardiovascular;  Laterality: N/A;   LOWER EXTREMITY ANGIOGRAPHY N/A 08/18/2021   Procedure: Lower Extremity Angiography;  Surgeon: Waynetta Sandy, MD;  Location: [facility];  Service: Cardiovascular;  Laterality: N/A;   NECK SURGERY     PERIPHERAL VASCULAR BALLOON ANGIOPLASTY  04/18/2019   Procedure: PERIPHERAL VASCULAR BALLOON ANGIOPLASTY;  Surgeon: [name], MD;  Location: [facility];  Service: Cardiovascular;;   PERIPHERAL VASCULAR BALLOON ANGIOPLASTY Left 11/12/2020   Procedure: PERIPHERAL VASCULAR BALLOON ANGIOPLASTY;  Surgeon: [name], MD;  Location: [facility];  Service: Cardiovascular;  Laterality: Left;  Failed PTA of superficial femoral artery.   PERIPHERAL VASCULAR BALLOON ANGIOPLASTY Right 08/18/2021   Procedure: PERIPHERAL VASCULAR BALLOON ANGIOPLASTY;  Surgeon: Waynetta Sandy, MD;  Location: [facility];  Service: Cardiovascular;  Laterality: Right;  peroneal   PERIPHERAL VASCULAR INTERVENTION Right  03/21/2019   Procedure: PERIPHERAL VASCULAR INTERVENTION;  Surgeon: [name], MD;  Location: [facility];  Service: Cardiovascular;  Laterality: Right;  superficial femoral   PERIPHERAL VASCULAR INTERVENTION Right 08/18/2021   Procedure: PERIPHERAL VASCULAR INTERVENTION;  Surgeon: Waynetta Sandy, MD;  Location: [facility];  Service: Cardiovascular;  Laterality: Right;  tibial peroneal trunk    Social History   Socioeconomic History   Marital status: Married    Spouse name: Belenda Cruise   Number of children: 2   Years of education: 12   Highest education level: 12th grade  Occupational History   Occupation: retired  Tobacco Use   Smoking status: Former    Packs/day: 0.25    Years: 51.00    Total pack years: 12.75    Types: Cigarettes    Quit date: 08/14/2016    Years since quitting: 5.1   Smokeless tobacco: Never   Tobacco comments:    smokes  a pack a week  Vaping Use   Vaping Use: Never used  Substance and Sexual Activity   Alcohol use: No    Alcohol/week: 0.0 standard drinks of alcohol   Drug use: No   Sexual activity: Yes  Other Topics Concern   Not on file  Social History Narrative   Lives with his wife.  Retired.  Unable to afford expensive medicines.     He has 2 children from previous marriage - they live in [place]       Caffeine: 1 cup of 1/2 caff coffee in AM, drinks more if at a restaurant    Social Determinants of Health   Financial Resource Strain: Medium Risk (01/06/2021)   Overall Financial Resource Strain (CARDIA)    Difficulty of Paying Living Expenses: Somewhat hard  Food Insecurity: No Food Insecurity (01/06/2021)   Hunger Vital Sign    Worried About Running Out of Food in the Last Year: Never true    [place] in the Last Year: Never true  Transportation Needs: No Transportation Needs (01/06/2021)   PRAPARE - Hydrologist (Medical): No    Lack of Transportation (Non-Medical): No   Physical Activity: Insufficiently Active (01/06/2021)   Exercise Vital Sign    Days of Exercise per Week: 7 days    Minutes of Exercise per Session: 10 min  Stress: No Stress Concern Present (01/06/2021)   [place]    Feeling of Stress : Only a little  Social Connections: Moderately Isolated (01/06/2021)   Social Connection and Isolation Panel [NHANES]    Frequency of Communication with Friends and Family: More than three times a week    Frequency of Social Gatherings with Friends and Family: Twice a week    Attends Religious Services: Never    Marine scientist or Organizations: No    Attends Archivist Meetings: Never    Marital Status: Married  Intimate  Partner Violence: Not At Risk (01/06/2021)   Humiliation, Afraid, Rape, and Kick questionnaire    Fear of Current or Ex-Partner: No    Emotionally Abused: No    Physically Abused: No    Sexually Abused: No    Outpatient Encounter Medications as of 10/23/2021  Medication Sig   albuterol (VENTOLIN HFA) 108 (90 Base) MCG/ACT inhaler Inhale 2 puffs into the lungs every 6 (six) hours as needed for wheezing or shortness of breath.   amoxicillin-clavulanate (AUGMENTIN) 875-125 MG tablet Take 1 tablet by mouth 2 (two) times daily for 7 days.   aspirin EC 81 MG tablet Take 1 tablet (81 mg total) by mouth daily. Swallow whole.   Chlorphen-Phenyleph-ASA (ALKA-SELTZER PLUS COLD PO) Take 2 tablets by mouth daily.   clopidogrel (PLAVIX) 75 MG tablet TAKE 1 TABLET BY MOUTH DAILY   dapagliflozin propanediol (FARXIGA) 5 MG TABS tablet Take 1 tablet (5 mg total) by mouth daily before breakfast. (Patient taking differently: Take 10 mg by mouth daily before breakfast.)   DULoxetine (CYMBALTA) 30 MG capsule TAKE 1 CAPSULE BY MOUTH DAILY   ELIQUIS 5 MG TABS tablet TAKE 1 TABLET BY MOUTH TWO TIMES DAILY   ezetimibe (ZETIA) 10 MG tablet Take 1 tablet (10 mg total) by mouth  daily.   fenofibrate 160 MG tablet Take 1 tablet (160 mg total) by mouth daily.   fluticasone (FLONASE) 50 MCG/ACT nasal spray Place 1 spray into both nostrils daily as needed for allergies or rhinitis.   gabapentin (NEURONTIN) 300 MG capsule Take 1 capsule (300 mg total) by mouth 3 (three) times daily.   HUMULIN 70/30 (70-30) 100 UNIT/ML injection INJECT 60 UNITS SUBCUTANEOUSLY TWICE DAILY WITH A MEAL   HYDROcodone-acetaminophen (NORCO) 10-325 MG tablet Take 1 tablet by mouth every 4 (four) hours as needed for moderate pain.   insulin lispro (HUMALOG) 100 UNIT/ML KwikPen INJECT 15-20 UNITS SUBCUTANEOUSLY THREE TIMES A DAY WITH MEALS   Insulin Syringe-Needle U-100 28G X 1/2" 1 ML MISC Use to give insulin five times daily Dx E11.42   isosorbide mononitrate (IMDUR) 30 MG 24 hr tablet TAKE 1 TABLET BY MOUTH DAILY   Menthol, Topical Analgesic, (BLUE-EMU MAXIMUM STRENGTH EX) Apply 1 application. topically daily as needed (pain).   metolazone (ZAROXOLYN) 5 MG tablet Take 1 tablet (5 mg total) by mouth daily as needed (Swelling).   metoprolol succinate (TOPROL-XL) 50 MG 24 hr tablet Take 1 tablet by mouth once daily   metoprolol tartrate (LOPRESSOR) 50 MG tablet Take 1 tablet (50 mg total) by mouth daily as needed (a-fib).   MOUNJARO 5 MG/0.5ML Pen INJECT '5MG'$  SUBCUTANEOUSLY ONCE A WEEK (Patient taking differently: Inject 5 mg into the skin once a week. Sunday)   nitroGLYCERIN (NITROSTAT) 0.4 MG SL tablet DISSOLVE ONE TABLET UNDER THE TONGUE EVERY 5 MINUTES AS NEEDED FOR CHEST PAIN.  DO NOT EXCEED A TOTAL OF 3 DOSES IN 15 MINUTES Strength: 0.4 mg   nystatin cream (MYCOSTATIN) Apply 1 application. topically 2 (two) times daily. Use until rash clears and then for 2 days after   pantoprazole (PROTONIX) 40 MG tablet Take 1 tablet (40 mg total) by mouth daily.   traZODone (DESYREL) 100 MG tablet Take 1 tablet (100 mg total) by mouth at bedtime.   vitamin B-12 (CYANOCOBALAMIN) 1000 MCG tablet Take 1,000 mcg by  mouth daily.   [DISCONTINUED] amoxicillin (AMOXIL) 500 MG capsule Take 1 capsule (500 mg total) by mouth 3 (three) times daily. (Patient not taking: Reported on 10/16/2021)   [  DISCONTINUED] cephALEXin (KEFLEX) 500 MG capsule Take 1 capsule (500 mg total) by mouth 2 (two) times daily. (Patient not taking: Reported on 10/16/2021)   No facility-administered encounter medications on file as of 10/23/2021.    Allergies  Allergen Reactions   Shellfish Allergy Anaphylaxis and Other (See Comments)    Tongue swelling, hives    Sulfa Antibiotics Anaphylaxis and Rash    Tongue swelling, hives   Ace Inhibitors Other (See Comments) and Cough    CKD, renal failure    Escitalopram Other (See Comments)    Buzzing in ears,headache, felt like a zombie    Evolocumab Other (See Comments)    Myalgias, flu like sx    Fenofibrate Other (See Comments)    Body aches - pt currently taking isnt sure if its causing any pain    Invokana [Canagliflozin] Other (See Comments)    Syncope / dehydration   Lisinopril Cough   Metformin And Related Itching   Pravastatin Sodium Other (See Comments)    myalgias   Crestor [Rosuvastatin] Other (See Comments)    Myalgias    Horse-Derived Products Rash    horse serum   Lac Bovis Nausea Only    Ties stomach in knots     Lexapro [Escitalopram Oxalate] Other (See Comments)    Buzzing in ears,headache, felt like a zombie   Lipitor [Atorvastatin] Other (See Comments)    myalgias   Livalo [Pitavastatin] Other (See Comments)    Myalgias    Tape Rash    Review of Systems  Constitutional:  Negative for activity change, appetite change, chills, diaphoresis, fatigue, fever and unexpected weight change.  Respiratory:  Negative for cough and shortness of breath.   Cardiovascular:  Negative for chest pain, palpitations and leg swelling.  Gastrointestinal:  Negative for abdominal pain, nausea and vomiting.  Genitourinary:  Positive for dysuria, frequency, penile swelling and  urgency. Negative for decreased urine volume, difficulty urinating, enuresis, flank pain, genital sores, hematuria, hesitancy, penile discharge, penile pain, scrotal swelling and testicular pain.  Skin:  Positive for rash (penis).  Neurological:  Negative for weakness.  Psychiatric/Behavioral:  Negative for confusion.   All other systems reviewed and are negative.       Objective:  BP (!) 161/79   Pulse 100   Temp 98 F (36.7 C)   Ht '6\' 2"'$  (1.88 m)   Wt 285 lb (129.3 kg)   SpO2 97%   BMI 36.59 kg/m    Wt Readings from Last 3 Encounters:  10/23/21 285 lb (129.3 kg)  10/16/21 282 lb 9.6 oz (128.2 kg)  10/06/21 287 lb 8 oz (130.4 kg)    Physical Exam Vitals and nursing note reviewed.  Constitutional:      General: He is not in acute distress.    Appearance: Normal appearance. He is obese. He is not ill-appearing or diaphoretic.  HENT:     Head: Normocephalic and atraumatic.     Mouth/Throat:     Mouth: Mucous membranes are moist.  Eyes:     Pupils: Pupils are equal, round, and reactive to light.  Cardiovascular:     Rate and Rhythm: Normal rate and regular rhythm.     Heart sounds: Normal heart sounds. No murmur heard.    No friction rub. No gallop.  Pulmonary:     Effort: Pulmonary effort is normal.     Breath sounds: Normal breath sounds.  Abdominal:     General: Bowel sounds are normal.     Palpations: Abdomen  is soft.     Tenderness: There is no abdominal tenderness. There is no right CVA tenderness or left CVA tenderness.  Genitourinary:    Comments: Beefy red rash to penis Musculoskeletal:     Right lower leg: Edema present.     Left lower leg: Edema present.  Skin:    General: Skin is warm and dry.     Capillary Refill: Capillary refill takes less than 2 seconds.  Neurological:     General: No focal deficit present.     Mental Status: He is alert and oriented to person, place, and time.  Psychiatric:        Mood and Affect: Mood normal.         Behavior: Behavior normal.        Thought Content: Thought content normal.        Judgment: Judgment normal.     Results for orders placed or performed during the hospital encounter of 08/15/21  Surgical PCR screen   Specimen: Nasal Mucosa; Nasal Swab  Result Value Ref Range   MRSA, PCR NEGATIVE NEGATIVE   Staphylococcus aureus NEGATIVE NEGATIVE  Urine Culture   Specimen: Urine, Clean Catch  Result Value Ref Range   Specimen Description URINE, CLEAN CATCH    Special Requests Normal    Culture      NO GROWTH Performed at [facility], [place] [address], [place], [place] [address]    Report Status 08/17/2021 FINAL   Culture, blood (routine x 2)   Specimen: Right Antecubital; Blood  Result Value Ref Range   Specimen Description RIGHT ANTECUBITAL    Special Requests      BOTTLES DRAWN AEROBIC AND ANAEROBIC Blood Culture adequate volume   Culture      NO GROWTH 5 DAYS Performed at [facility], [place] [address] [place], [place] [address]    Report Status 08/21/2021 FINAL   Culture, blood (routine x 2)   Specimen: BLOOD RIGHT HAND  Result Value Ref Range   Specimen Description BLOOD RIGHT HAND    Special Requests      BOTTLES DRAWN AEROBIC AND ANAEROBIC Blood Culture adequate volume   Culture      NO GROWTH 5 DAYS Performed at [facility], [place] [address], [place], [place] [address]    Report Status 08/21/2021 FINAL   APTT  Result Value Ref Range   aPTT 37 (H) 24 - 36 seconds  Comprehensive metabolic panel  Result Value Ref Range   Sodium 136 135 - 145 mmol/L   Potassium 4.1 3.5 - 5.1 mmol/L   Chloride 99 98 - 111 mmol/L   CO2 27 22 - 32 mmol/L   Glucose, Bld 138 (H) 70 - 99 mg/dL   BUN 19 8 - 23 mg/dL   Creatinine, Ser 1.47 (H) 0.61 - 1.24 mg/dL   Calcium 9.7 8.9 - 10.3 mg/dL   Total Protein 7.7 6.5 - 8.1 g/dL   Albumin 3.9 3.5 - 5.0 g/dL   AST 26 15 - 41 U/L   ALT 19 0 - 44 U/L   Alkaline Phosphatase 42 38 - 126 U/L   Total Bilirubin 0.7 0.3 -  1.2 mg/dL   GFR, Estimated 51 (L) >60 mL/min   Anion gap 10 5 - 15  Protime-INR  Result Value Ref Range   Prothrombin Time 14.0 11.4 - 15.2 seconds   INR 1.1 0.8 - 1.2  Urinalysis, Routine w reflex microscopic Urine, Clean Catch  Result Value  Ref Range   Color, Urine YELLOW YELLOW   APPearance CLEAR CLEAR   Specific Gravity, Urine 1.020 1.005 - 1.030   pH 7.0 5.0 - 8.0   Glucose, UA 150 (A) NEGATIVE mg/dL   Hgb urine dipstick NEGATIVE NEGATIVE   Bilirubin Urine NEGATIVE NEGATIVE   Ketones, ur NEGATIVE NEGATIVE mg/dL   Protein, ur 30 (A) NEGATIVE mg/dL   Nitrite NEGATIVE NEGATIVE   Leukocytes,Ua NEGATIVE NEGATIVE   RBC / HPF 0-5 0 - 5 RBC/hpf   WBC, UA 0-5 0 - 5 WBC/hpf   Bacteria, UA NONE SEEN NONE SEEN   Squamous Epithelial / LPF 0-5 0 - 5  Glucose, capillary  Result Value Ref Range   Glucose-Capillary 136 (H) 70 - 99 mg/dL  CBC  Result Value Ref Range   WBC 10.0 4.0 - 10.5 K/uL   RBC 4.83 4.22 - 5.81 MIL/uL   Hemoglobin 13.1 13.0 - 17.0 g/dL   HCT 41.1 39.0 - 52.0 %   MCV 85.1 80.0 - 100.0 fL   MCH 27.1 26.0 - 34.0 pg   MCHC 31.9 30.0 - 36.0 g/dL   RDW 20.7 (H) 11.5 - 15.5 %   Platelets 269 150 - 400 K/uL   nRBC 0.0 0.0 - 0.2 %  Glucose, capillary  Result Value Ref Range   Glucose-Capillary 121 (H) 70 - 99 mg/dL  Glucose, capillary  Result Value Ref Range   Glucose-Capillary 132 (H) 70 - 99 mg/dL   Comment 1 Notify RN    Comment 2 Document in Chart   Glucose, capillary  Result Value Ref Range   Glucose-Capillary 144 (H) 70 - 99 mg/dL  Glucose, capillary  Result Value Ref Range   Glucose-Capillary 174 (H) 70 - 99 mg/dL  Glucose, capillary  Result Value Ref Range   Glucose-Capillary 172 (H) 70 - 99 mg/dL  Glucose, capillary  Result Value Ref Range   Glucose-Capillary 191 (H) 70 - 99 mg/dL  Glucose, capillary  Result Value Ref Range   Glucose-Capillary 189 (H) 70 - 99 mg/dL  Lipid panel  Result Value Ref Range   Cholesterol 144 0 - 200 mg/dL    Triglycerides 181 (H) <150 mg/dL   HDL 23 (L) >40 mg/dL   Total CHOL/HDL Ratio 6.3 RATIO   VLDL 36 0 - 40 mg/dL   LDL Cholesterol 85 0 - 99 mg/dL  CBC  Result Value Ref Range   WBC 10.4 4.0 - 10.5 K/uL   RBC 4.04 (L) 4.22 - 5.81 MIL/uL   Hemoglobin 10.9 (L) 13.0 - 17.0 g/dL   HCT 34.6 (L) 39.0 - 52.0 %   MCV 85.6 80.0 - 100.0 fL   MCH 27.0 26.0 - 34.0 pg   MCHC 31.5 30.0 - 36.0 g/dL   RDW 20.9 (H) 11.5 - 15.5 %   Platelets 225 150 - 400 K/uL   nRBC 0.0 0.0 - 0.2 %  Creatinine, serum  Result Value Ref Range   Creatinine, Ser 1.36 (H) 0.61 - 1.24 mg/dL   GFR, Estimated 56 (L) >60 mL/min  Glucose, capillary  Result Value Ref Range   Glucose-Capillary 183 (H) 70 - 99 mg/dL  Glucose, capillary  Result Value Ref Range   Glucose-Capillary 189 (H) 70 - 99 mg/dL   Comment 1 Notify RN    Comment 2 Document in Chart   Urinalysis, Complete w Microscopic Urine, Clean Catch  Result Value Ref Range   Color, Urine YELLOW YELLOW   APPearance CLEAR CLEAR   Specific Gravity,  Urine 1.014 1.005 - 1.030   pH 6.0 5.0 - 8.0   Glucose, UA >=500 (A) NEGATIVE mg/dL   Hgb urine dipstick NEGATIVE NEGATIVE   Bilirubin Urine NEGATIVE NEGATIVE   Ketones, ur 5 (A) NEGATIVE mg/dL   Protein, ur NEGATIVE NEGATIVE mg/dL   Nitrite NEGATIVE NEGATIVE   Leukocytes,Ua NEGATIVE NEGATIVE   WBC, UA 0-5 0 - 5 WBC/hpf   Bacteria, UA NONE SEEN NONE SEEN  Glucose, capillary  Result Value Ref Range   Glucose-Capillary 204 (H) 70 - 99 mg/dL  CBC  Result Value Ref Range   WBC 9.3 4.0 - 10.5 K/uL   RBC 3.75 (L) 4.22 - 5.81 MIL/uL   Hemoglobin 10.1 (L) 13.0 - 17.0 g/dL   HCT 32.5 (L) 39.0 - 52.0 %   MCV 86.7 80.0 - 100.0 fL   MCH 26.9 26.0 - 34.0 pg   MCHC 31.1 30.0 - 36.0 g/dL   RDW 20.8 (H) 11.5 - 15.5 %   Platelets 227 150 - 400 K/uL   nRBC 0.0 0.0 - 0.2 %  Basic metabolic panel  Result Value Ref Range   Sodium 132 (L) 135 - 145 mmol/L   Potassium 4.4 3.5 - 5.1 mmol/L   Chloride 99 98 - 111 mmol/L   CO2  26 22 - 32 mmol/L   Glucose, Bld 242 (H) 70 - 99 mg/dL   BUN 15 8 - 23 mg/dL   Creatinine, Ser 1.44 (H) 0.61 - 1.24 mg/dL   Calcium 8.8 (L) 8.9 - 10.3 mg/dL   GFR, Estimated 52 (L) >60 mL/min   Anion gap 7 5 - 15  Glucose, capillary  Result Value Ref Range   Glucose-Capillary 209 (H) 70 - 99 mg/dL   Comment 1 Notify RN    Comment 2 Document in Chart   Glucose, capillary  Result Value Ref Range   Glucose-Capillary 181 (H) 70 - 99 mg/dL  Glucose, capillary  Result Value Ref Range   Glucose-Capillary 227 (H) 70 - 99 mg/dL  Glucose, capillary  Result Value Ref Range   Glucose-Capillary 228 (H) 70 - 99 mg/dL   Comment 1 Notify RN    Comment 2 Document in Chart   Glucose, capillary  Result Value Ref Range   Glucose-Capillary 238 (H) 70 - 99 mg/dL   Comment 1 Notify RN    Comment 2 Document in Chart   Glucose, capillary  Result Value Ref Range   Glucose-Capillary 232 (H) 70 - 99 mg/dL  Glucose, capillary  Result Value Ref Range   Glucose-Capillary 212 (H) 70 - 99 mg/dL  Glucose, capillary  Result Value Ref Range   Glucose-Capillary 249 (H) 70 - 99 mg/dL  Glucose, capillary  Result Value Ref Range   Glucose-Capillary 214 (H) 70 - 99 mg/dL  Basic metabolic panel   Result Value Ref Range   Sodium 133 (L) 135 - 145 mmol/L   Potassium 4.8 3.5 - 5.1 mmol/L   Chloride 96 (L) 98 - 111 mmol/L   CO2 28 22 - 32 mmol/L   Glucose, Bld 261 (H) 70 - 99 mg/dL   BUN 14 8 - 23 mg/dL   Creatinine, Ser 1.30 (H) 0.61 - 1.24 mg/dL   Calcium 9.5 8.9 - 10.3 mg/dL   GFR, Estimated 59 (L) >60 mL/min   Anion gap 9 5 - 15  CBC  Result Value Ref Range   WBC 8.6 4.0 - 10.5 K/uL   RBC 4.24 4.22 - 5.81 MIL/uL   Hemoglobin 11.3 (  L) 13.0 - 17.0 g/dL   HCT 36.6 (L) 39.0 - 52.0 %   MCV 86.3 80.0 - 100.0 fL   MCH 26.7 26.0 - 34.0 pg   MCHC 30.9 30.0 - 36.0 g/dL   RDW 19.2 (H) 11.5 - 15.5 %   Platelets 274 150 - 400 K/uL   nRBC 0.0 0.0 - 0.2 %  Lipid panel  Result Value Ref Range   Cholesterol  167 0 - 200 mg/dL   Triglycerides 270 (H) <150 mg/dL   HDL 23 (L) >40 mg/dL   Total CHOL/HDL Ratio 7.3 RATIO   VLDL 54 (H) 0 - 40 mg/dL   LDL Cholesterol 90 0 - 99 mg/dL  Glucose, capillary  Result Value Ref Range   Glucose-Capillary 250 (H) 70 - 99 mg/dL   Comment 1 Notify RN    Comment 2 Document in Chart   Glucose, capillary  Result Value Ref Range   Glucose-Capillary 287 (H) 70 - 99 mg/dL   Comment 1 Notify RN    Comment 2 Document in Chart   Glucose, capillary  Result Value Ref Range   Glucose-Capillary 225 (H) 70 - 99 mg/dL  Glucose, capillary  Result Value Ref Range   Glucose-Capillary 202 (H) 70 - 99 mg/dL  Glucose, capillary  Result Value Ref Range   Glucose-Capillary 135 (H) 70 - 99 mg/dL  Glucose, capillary  Result Value Ref Range   Glucose-Capillary 170 (H) 70 - 99 mg/dL   Comment 1 Notify RN    Comment 2 Document in Chart   Glucose, capillary  Result Value Ref Range   Glucose-Capillary 201 (H) 70 - 99 mg/dL  POCT Activated clotting time  Result Value Ref Range   Activated Clotting Time 233 seconds  I-STAT 7, (LYTES, BLD GAS, ICA, H+H)  Result Value Ref Range   pH, Arterial 7.374 7.35 - 7.45   pCO2 arterial 45.0 32 - 48 mmHg   pO2, Arterial 105 83 - 108 mmHg   Bicarbonate 26.3 20.0 - 28.0 mmol/L   TCO2 28 22 - 32 mmol/L   O2 Saturation 98 %   Acid-Base Excess 1.0 0.0 - 2.0 mmol/L   Sodium 137 135 - 145 mmol/L   Potassium 4.0 3.5 - 5.1 mmol/L   Calcium, Ion 1.25 1.15 - 1.40 mmol/L   HCT 36.0 (L) 39.0 - 52.0 %   Hemoglobin 12.2 (L) 13.0 - 17.0 g/dL   Patient temperature 36.7 C    Sample type ARTERIAL   POCT Activated clotting time  Result Value Ref Range   Activated Clotting Time 191 seconds  POCT Activated clotting time  Result Value Ref Range   Activated Clotting Time 191 seconds  POCT Activated clotting time  Result Value Ref Range   Activated Clotting Time 179 seconds  Type and screen  Result Value Ref Range   ABO/RH(D) A POS    Antibody  Screen NEG    Sample Expiration      08/18/2021,2359 Performed at [facility], [address] [place], [place] [address]    *Note: Due to a large number of results and/or encounters for the requested time period, some results have not been displayed. A complete set of results can be found in Results Review.       Pertinent labs & imaging results that were available during my care of the patient were reviewed by me and considered in my medical decision making.  Assessment & Plan:  Timithy was seen today for dysuria.  Diagnoses and all orders for this visit:  Dysuria Acute cystitis with hematuria Urinalysis as noted. Culture pending. No indications of acute pyelonephritis. Augmentin as prescribed. Will change if warranted. Increase water intake and avoid bladder irritants. Aware of glucose in urine, states he has not taken his "shot" today. Aware to take once home. Report any new, worsening, or persistent symptoms. Has follow up with PCP in 2 weeks, recheck urine at this time.  -     Urinalysis, Routine w reflex microscopic -     Urine Culture -     amoxicillin-clavulanate (AUGMENTIN) 875-125 MG tablet; Take 1 tablet by mouth 2 (two) times daily for 7 days.  Balanitis Symptomatic care discussed in detail. Medications as prescribed. -     nystatin cream (MYCOSTATIN); Apply 1 application. topically 2 (two) times daily. Use until rash clears and then for 2 days after     Continue all other maintenance medications.  Follow up plan: Return if symptoms worsen or fail to improve.   Continue healthy lifestyle choices, including diet (rich in fruits, vegetables, and lean proteins, and low in salt and simple carbohydrates) and exercise (at least 30 minutes of moderate physical activity daily).  Educational handout given for UTI  The above assessment and management plan was discussed with the patient. The patient verbalized understanding of and has agreed to the management plan.  Patient is aware to call the clinic if they develop any new symptoms or if symptoms persist or worsen. Patient is aware when to return to the clinic for a follow-up visit. Patient educated on when it is appropriate to go to the emergency department.   Monia Pouch, FNP-C [facility] [phone]

## 2021-10-24 ENCOUNTER — Telehealth: Payer: Self-pay | Admitting: *Deleted

## 2021-10-24 NOTE — Telephone Encounter (Signed)
Pt agreeable to plan of care for tele pre op appt 10/28/21 @ 1 pm. Pt has his appt 6/13 at 8:30 am with Dr. Lovena Le in [place] who pt device. Med rec and consent are done.

## 2021-10-24 NOTE — Telephone Encounter (Signed)
Dr. [name], are you agreeable to proceeding with colonoscopy +/- EGD with holding Eliquis x 1 day rather than 2?

## 2021-10-24 NOTE — Telephone Encounter (Signed)
Pt agreeable to plan of care for tele pre op appt 10/28/21 @ 1 pm. Pt has his appt 6/13 at 8:30 am with Dr. Lovena Le in [place] who pt device. Med rec and consent are done.     Patient Consent for Virtual Visit        [name] [name] has provided verbal consent on 10/24/2021 for a virtual visit (video or telephone).   CONSENT FOR VIRTUAL VISIT FOR:  [name] [name]  By participating in this virtual visit I agree to the following:  I hereby voluntarily request, consent and authorize [facility] and its employed or contracted physicians, physician assistants, nurse practitioners or other licensed health care professionals (the Practitioner), to provide me with telemedicine health care services (the "Services") as deemed necessary by the treating Practitioner. I acknowledge and consent to receive the Services by the Practitioner via telemedicine. I understand that the telemedicine visit will involve communicating with the Practitioner through live audiovisual communication technology and the disclosure of certain medical information by electronic transmission. I acknowledge that I have been given the opportunity to request an in-person assessment or other available alternative prior to the telemedicine visit and am voluntarily participating in the telemedicine visit.  I understand that I have the right to withhold or withdraw my consent to the use of telemedicine in the course of my care at any time, without affecting my right to future care or treatment, and that the Practitioner or I may terminate the telemedicine visit at any time. I understand that I have the right to inspect all information obtained and/or recorded in the course of the telemedicine visit and may receive copies of available information for a reasonable fee.  I understand that some of the potential risks of receiving the Services via telemedicine include:  Delay or interruption in medical evaluation due to technological equipment failure  or disruption; Information transmitted may not be sufficient (e.g. poor resolution of images) to allow for appropriate medical decision making by the Practitioner; and/or  In rare instances, security protocols could fail, causing a breach of personal health information.  Furthermore, I acknowledge that it is my responsibility to provide information about my medical history, conditions and care that is complete and accurate to the best of my ability. I acknowledge that Practitioner's advice, recommendations, and/or decision may be based on factors not within their control, such as incomplete or inaccurate data provided by me or distortions of diagnostic images or specimens that may result from electronic transmissions. I understand that the practice of medicine is not an exact science and that Practitioner makes no warranties or guarantees regarding treatment outcomes. I acknowledge that a copy of this consent can be made available to me via my patient portal ([facility]), or I can request a printed copy by calling the office of [facility].    I understand that my insurance will be billed for this visit.   I have read or had this consent read to me. I understand the contents of this consent, which adequately explains the benefits and risks of the Services being provided via telemedicine.  I have been provided ample opportunity to ask questions regarding this consent and the Services and have had my questions answered to my satisfaction. I give my informed consent for the services to be provided through the use of telemedicine in my medical care

## 2021-10-24 NOTE — Telephone Encounter (Signed)
Patients spouse called concerned about patients symptoms.  Fever 100.2, back pain, and urinary frequency.   Please advise

## 2021-10-27 NOTE — Telephone Encounter (Signed)
Noted. Will call pt once we receive future schedule

## 2021-10-27 NOTE — Telephone Encounter (Signed)
Mindy:  Please proceed with scheduling EGD/TCS with Dr. Daiva Nakayama as planned.    Patient will continue Plavix for procedure.  He will hold Eliquis for 1 day prior to procedure.  I believe you should already have separate instructions for diabetes medication adjustments.

## 2021-10-28 ENCOUNTER — Telehealth: Payer: Self-pay

## 2021-10-28 ENCOUNTER — Telehealth: Payer: Medicare Other

## 2021-10-28 ENCOUNTER — Encounter: Payer: Self-pay | Admitting: Internal Medicine

## 2021-10-28 ENCOUNTER — Ambulatory Visit (INDEPENDENT_AMBULATORY_CARE_PROVIDER_SITE_OTHER): Payer: Medicare Other | Admitting: Internal Medicine

## 2021-10-28 VITALS — BP 144/82 | HR 89 | Ht 75.0 in | Wt 280.0 lb

## 2021-10-28 DIAGNOSIS — I480 Paroxysmal atrial fibrillation: Secondary | ICD-10-CM

## 2021-10-28 NOTE — Patient Instructions (Signed)
Medication Instructions:  Your physician recommends that you continue on your current medications as directed. Please refer to the Current Medication list given to you today.  *If you need a refill on your cardiac medications before your next appointment, please call your pharmacy*   Lab Work: NONE   If you have labs (blood work) drawn today and your tests are completely normal, you will receive your results only by: MyChart Message (if you have MyChart) OR A paper copy in the mail If you have any lab test that is abnormal or we need to change your treatment, we will call you to review the results.   Testing/Procedures: NONE    Follow-Up: At [facility], you and your health needs are our priority.  As part of our continuing mission to provide you with exceptional heart care, we have created designated Provider Care Teams.  These Care Teams include your primary Cardiologist (physician) and Advanced Practice Providers (APPs -  Physician Assistants and Nurse Practitioners) who all work together to provide you with the care you need, when you need it.  We recommend signing up for the patient portal called "MyChart".  Sign up information is provided on this After Visit Summary.  MyChart is used to connect with patients for Virtual Visits (Telemedicine).  Patients are able to view lab/test results, encounter notes, upcoming appointments, etc.  Non-urgent messages can be sent to your provider as well.   To learn more about what you can do with MyChart, go to [email or web address].    Your next appointment:   6 month(s)  The format for your next appointment:   In Person  Provider:   Gregg Taylor, MD    Other Instructions Thank you for choosing  [facility]!    Important Information About Sugar       

## 2021-10-28 NOTE — Telephone Encounter (Signed)
Error

## 2021-10-28 NOTE — Progress Notes (Addendum)
HPI Mr. Kost returns for followup of PAF. He is a pleasant but obese 71 yo man with a h/o CAD, HTN, and diastolic CHF. He developed atrial fib with a RVR and I recommended he start amiodarone. His palpitations resolved. He began to feel badly and stopped the amiodarone. He has remained off amio and has had only a couple of episodes of palpitations. He is still weak. He denies syncope. He is losing weight.  Allergies  Allergen Reactions   Shellfish Allergy Anaphylaxis and Other (See Comments)    Tongue swelling, hives    Sulfa Antibiotics Anaphylaxis and Rash    Tongue swelling, hives   Ace Inhibitors Other (See Comments) and Cough    CKD, renal failure    Escitalopram Other (See Comments)    Buzzing in ears,headache, felt like a zombie    Evolocumab Other (See Comments)    Myalgias, flu like sx    Fenofibrate Other (See Comments)    Body aches - pt currently taking isnt sure if its causing any pain    Invokana [Canagliflozin] Other (See Comments)    Syncope / dehydration   Lisinopril Cough   Metformin And Related Itching   Pravastatin Sodium Other (See Comments)    myalgias   Crestor [Rosuvastatin] Other (See Comments)    Myalgias    Horse-Derived Products Rash    horse serum   Lac Bovis Nausea Only    Ties stomach in knots     Lexapro [Escitalopram Oxalate] Other (See Comments)    Buzzing in ears,headache, felt like a zombie   Lipitor [Atorvastatin] Other (See Comments)    myalgias   Livalo [Pitavastatin] Other (See Comments)    Myalgias    Tape Rash     Current Outpatient Medications  Medication Sig Dispense Refill   albuterol (VENTOLIN HFA) 108 (90 Base) MCG/ACT inhaler Inhale 2 puffs into the lungs every 6 (six) hours as needed for wheezing or shortness of breath. 8.5 g 3   amoxicillin-clavulanate (AUGMENTIN) 875-125 MG tablet Take 1 tablet by mouth 2 (two) times daily for 7 days. 14 tablet 0   aspirin EC 81 MG tablet Take 1 tablet (81 mg total) by  mouth daily. Swallow whole. 150 tablet 2   Chlorphen-Phenyleph-ASA (ALKA-SELTZER PLUS COLD PO) Take 2 tablets by mouth daily.     clopidogrel (PLAVIX) 75 MG tablet TAKE 1 TABLET BY MOUTH DAILY 30 tablet 3   dapagliflozin propanediol (FARXIGA) 5 MG TABS tablet Take 1 tablet (5 mg total) by mouth daily before breakfast. (Patient taking differently: Take 10 mg by mouth daily before breakfast.) 90 tablet 2   DULoxetine (CYMBALTA) 30 MG capsule TAKE 1 CAPSULE BY MOUTH DAILY 90 capsule 3   ELIQUIS 5 MG TABS tablet TAKE 1 TABLET BY MOUTH TWO TIMES DAILY 60 tablet 2   ezetimibe (ZETIA) 10 MG tablet Take 1 tablet (10 mg total) by mouth daily. 90 tablet 3   fenofibrate 160 MG tablet Take 1 tablet (160 mg total) by mouth daily. 90 tablet 3   fluticasone (FLONASE) 50 MCG/ACT nasal spray Place 1 spray into both nostrils daily as needed for allergies or rhinitis.     gabapentin (NEURONTIN) 300 MG capsule Take 1 capsule (300 mg total) by mouth 3 (three) times daily. 270 capsule 1   HUMULIN 70/30 (70-30) 100 UNIT/ML injection INJECT 60 UNITS SUBCUTANEOUSLY TWICE DAILY WITH A MEAL 40 mL 2   HYDROcodone-acetaminophen (NORCO) 10-325 MG tablet Take 1 tablet by mouth  every 4 (four) hours as needed for moderate pain. 30 tablet 0   insulin lispro (HUMALOG) 100 UNIT/ML KwikPen INJECT 15-20 UNITS SUBCUTANEOUSLY THREE TIMES A DAY WITH MEALS 15 mL 10   Insulin Syringe-Needle U-100 28G X 1/2" 1 ML MISC Use to give insulin five times daily Dx E11.42 500 each 3   isosorbide mononitrate (IMDUR) 30 MG 24 hr tablet TAKE 1 TABLET BY MOUTH DAILY 30 tablet 2   Menthol, Topical Analgesic, (BLUE-EMU MAXIMUM STRENGTH EX) Apply 1 application. topically daily as needed (pain).     metolazone (ZAROXOLYN) 5 MG tablet Take 1 tablet (5 mg total) by mouth daily as needed (Swelling). 30 tablet 3   metoprolol succinate (TOPROL-XL) 50 MG 24 hr tablet Take 1 tablet by mouth once daily 90 tablet 1   metoprolol tartrate (LOPRESSOR) 50 MG tablet Take  1 tablet (50 mg total) by mouth daily as needed (a-fib). 30 tablet 6   MOUNJARO 5 MG/0.5ML Pen INJECT '5MG'$  SUBCUTANEOUSLY ONCE A WEEK (Patient taking differently: Inject 5 mg into the skin once a week. Sunday) 2 mL 10   nitroGLYCERIN (NITROSTAT) 0.4 MG SL tablet DISSOLVE ONE TABLET UNDER THE TONGUE EVERY 5 MINUTES AS NEEDED FOR CHEST PAIN.  DO NOT EXCEED A TOTAL OF 3 DOSES IN 15 MINUTES Strength: 0.4 mg 25 tablet 0   nystatin cream (MYCOSTATIN) Apply 1 application. topically 2 (two) times daily. Use until rash clears and then for 2 days after 30 g 0   pantoprazole (PROTONIX) 40 MG tablet Take 1 tablet (40 mg total) by mouth daily. 90 tablet 1   traZODone (DESYREL) 100 MG tablet Take 1 tablet (100 mg total) by mouth at bedtime. 90 tablet 1   vitamin B-12 (CYANOCOBALAMIN) 1000 MCG tablet Take 1,000 mcg by mouth daily.     No current facility-administered medications for this visit.     Past Medical History:  Diagnosis Date   Anxiety    Arthritis    Atrial fibrillation (HCC)    CAD (coronary artery disease)    a. 2010: DES to CTO of RCA. EF 55% b. 07/2016: cath showing total occlusion within previously placed RCA stent (collaterals present), severe stenosis along LCx and OM1 (treated with 2 overlapping DES). c. repeat cath in 01/2018 showing patent stents along LCx and OM with CTO of D2, CTO of distal LCx, and CTO of RCA with collaterals present overall unchanged since 2018 with medical management recom   Cellulitis and abscess rt groin    Complication of anesthesia    " I woke up during a colonoscopy "      Depression    Diabetes mellitus    Diastolic CHF ([place])    Disorders of iron metabolism    Dysrhythmia    Fibromyalgia    GERD (gastroesophageal reflux disease)    History of hiatal hernia    Hyperlipidemia    Hypertension    Low serum testosterone level    Medically noncompliant    Myocardial infarction ([place])    05-23-20   Pneumonia     ROS:   All systems reviewed and negative  except as noted in the HPI.   Past Surgical History:  Procedure Laterality Date   ABDOMINAL AORTOGRAM W/LOWER EXTREMITY N/A 03/21/2019   Procedure: ABDOMINAL AORTOGRAM W/LOWER EXTREMITY;  Surgeon: [name], MD;  Location: [facility] [facility];  Service: Cardiovascular;  Laterality: N/A;   ABDOMINAL AORTOGRAM W/LOWER EXTREMITY N/A 11/12/2020   Procedure: ABDOMINAL AORTOGRAM W/LOWER EXTREMITY;  Surgeon:  [name], MD;  Location: [place] [facility];  Service: Cardiovascular;  Laterality: N/A;   ABDOMINAL AORTOGRAM W/LOWER EXTREMITY N/A 01/28/2021   Procedure: ABDOMINAL AORTOGRAM W/LOWER EXTREMITY;  Surgeon: [name], MD;  Location: [place] [facility];  Service: Cardiovascular;  Laterality: N/A;   ABDOMINAL AORTOGRAM W/LOWER EXTREMITY Bilateral 07/29/2021   Procedure: ABDOMINAL AORTOGRAM W/LOWER EXTREMITY;  Surgeon: [name], MD;  Location: [place] [facility];  Service: Cardiovascular;  Laterality: Bilateral;   ANGIOPLASTY N/A 08/15/2021   Procedure: ATTEMPTED RIGHT PERONEAL  ANGIOPLASTY, LEFT PERONEAL ANGIOPLASTY;  Surgeon: [name], MD;  [facility];  Service: Vascular;  Laterality: N/A;   BACK SURGERY  2015   ACDF by Dr. [name]   COLONOSCOPY N/A 10/01/2014   Dr. [name]: multiple tubular adenomas removed, colonic diverticulosis, redundant colon. next tcs advised for 09/2017. PATIENT NEEDS PROPOFOL FOR FAILED CONSCIOUS SEDATION   CORONARY STENT INTERVENTION N/A 07/30/2016   Procedure: Coronary Stent Intervention;  Surgeon: [name], MD;  Location: [facility] [facility];  Service: Cardiovascular;  Laterality: N/A;   CORONARY STENT PLACEMENT  2000   By Dr. Olevia Perches   EP IMPLANTABLE DEVICE N/A 05/25/2016   Procedure: Loop Recorder Insertion;  Surgeon: [name], MD;  Location: [place] [facility];  Service: Cardiovascular;  Laterality: N/A;   ESOPHAGOGASTRODUODENOSCOPY     esophagus stretched remotely at [facility]   ESOPHAGOGASTRODUODENOSCOPY N/A 10/01/2014   Dr.  [name]: patchy mottling/erythema and minimal polypoid appearance of gastric mucosa. bx with mild inlammation but no H.pylori   FEMORAL-POPLITEAL BYPASS GRAFT Left 11/22/2020   Procedure: LEFT FEMORAL-POPLITEAL BYPASS GRAFT;  Surgeon: [name], MD;  [facility];  Service: Vascular;  Laterality: Left;   FEMORAL-POPLITEAL BYPASS GRAFT Left 08/15/2021   Procedure: REDO LEFT FEMORAL-POPLITEAL BYPASS USING PROPATEN GRAFT;  Surgeon: [name], MD;  Location: [place];  Service: Vascular;  Laterality: Left;   HERNIA REPAIR  6948   umbilical   INSERTION OF ILIAC STENT Right 11/22/2020   Procedure: INSERTION OF ELUVIA STENT INTO RIGHT DISTAL SUPERFICIAL FEMORAL ARTERY;  Surgeon: [name], MD;  Location: [place];  Service: Vascular;  Laterality: Right;   LEFT HEART CATH AND CORONARY ANGIOGRAPHY N/A 07/30/2016   Procedure: Left Heart Cath and Coronary Angiography;  Surgeon: [name], MD;  Location: [place] [facility];  Service: Cardiovascular;  Laterality: N/A;   LEFT HEART CATH AND CORONARY ANGIOGRAPHY N/A 01/19/2018   Procedure: LEFT HEART CATH AND CORONARY ANGIOGRAPHY;  Surgeon: [name], MD;  Location: [place] [facility];  Service: Cardiovascular;  Laterality: N/A;   LEFT HEART CATH AND CORONARY ANGIOGRAPHY N/A 05/24/2020   Procedure: LEFT HEART CATH AND CORONARY ANGIOGRAPHY;  Surgeon: Burnell Blanks, MD;  Location: [place] [facility];  Service: Cardiovascular;  Laterality: N/A;   LEFT HEART CATH AND CORONARY ANGIOGRAPHY N/A 08/06/2021   Procedure: LEFT HEART CATH AND CORONARY ANGIOGRAPHY;  Surgeon: Burnell Blanks, MD;  Location: [place] [facility];  Service: Cardiovascular;  Laterality: N/A;   LESION REMOVAL     Lip and hand    LOWER EXTREMITY ANGIOGRAM Right 11/22/2020   Procedure: RIGHT LEG ANGIOGRAM;  Surgeon: [name], MD;  [facility];  Service: Vascular;  Laterality: Right;   LOWER EXTREMITY ANGIOGRAM Right 08/15/2021   Procedure: RIGHT LOWER  EXTREMITY ANGIOGRAM;  Surgeon: [name], MD;  [facility];  Service: Vascular;  Laterality: Right;   LOWER EXTREMITY ANGIOGRAPHY N/A  04/18/2019   Procedure: LOWER EXTREMITY ANGIOGRAPHY;  Surgeon: [name], MD;  Location: [place] [facility];  Service: Cardiovascular;  Laterality: N/A;   LOWER EXTREMITY ANGIOGRAPHY N/A 08/18/2021   Procedure: Lower Extremity Angiography;  Surgeon: Waynetta Sandy, MD;  Location: [place] [facility];  Service: Cardiovascular;  Laterality: N/A;   NECK SURGERY     PERIPHERAL VASCULAR BALLOON ANGIOPLASTY  04/18/2019   Procedure: PERIPHERAL VASCULAR BALLOON ANGIOPLASTY;  Surgeon: [name], MD;  Location: [place] [facility];  Service: Cardiovascular;;   PERIPHERAL VASCULAR BALLOON ANGIOPLASTY Left 11/12/2020   Procedure: PERIPHERAL VASCULAR BALLOON ANGIOPLASTY;  Surgeon: [name], MD;  Location: [place] [facility];  Service: Cardiovascular;  Laterality: Left;  Failed PTA of superficial femoral artery.   PERIPHERAL VASCULAR BALLOON ANGIOPLASTY Right 08/18/2021   Procedure: PERIPHERAL VASCULAR BALLOON ANGIOPLASTY;  Surgeon: Waynetta Sandy, MD;  Location: [place] [facility];  Service: Cardiovascular;  Laterality: Right;  peroneal   PERIPHERAL VASCULAR INTERVENTION Right 03/21/2019   Procedure: PERIPHERAL VASCULAR INTERVENTION;  Surgeon: [name], MD;  Location: [place] [facility];  Service: Cardiovascular;  Laterality: Right;  superficial femoral   PERIPHERAL VASCULAR INTERVENTION Right 08/18/2021   Procedure: PERIPHERAL VASCULAR INTERVENTION;  Surgeon: Waynetta Sandy, MD;  Location: [place] [facility];  Service: Cardiovascular;  Laterality: Right;  tibial peroneal trunk     Family History  Problem Relation Age of Onset   Diabetes Father    Valvular heart disease Father    Arthritis Father    Heart disease Father    Stroke Father    Alzheimer's disease Mother    Hyperlipidemia Mother    Hypertension  Mother    Arthritis Mother    Lung cancer Mother    Stroke Mother    Headache Mother    Arthritis/Rheumatoid Sister    Diabetes Sister    Hypertension Sister    Hyperlipidemia Sister    Depression Sister    Dementia Maternal Aunt    Dementia Maternal Uncle    Heart disease Maternal Uncle    Stomach cancer Paternal Uncle    Colon cancer Neg Hx    Liver disease Neg Hx      Social History   Socioeconomic History   Marital status: Married    Spouse name: Belenda Cruise   Number of children: 2   Years of education: 12   Highest education level: 12th grade  Occupational History   Occupation: retired  Tobacco Use   Smoking status: Former    Packs/day: 0.25    Years: 51.00    Total pack years: 12.75    Types: Cigarettes    Quit date: 08/14/2016    Years since quitting: 5.2   Smokeless tobacco: Never   Tobacco comments:    smokes  a pack a week  Vaping Use   Vaping Use: Never used  Substance and Sexual Activity   Alcohol use: No    Alcohol/week: 0.0 standard drinks of alcohol   Drug use: No   Sexual activity: Yes  Other Topics Concern   Not on file  Social History Narrative   Lives with his wife.  Retired.  Unable to afford expensive medicines.     He has 2 children from previous marriage - they live in [place]       Caffeine: 1 cup of 1/2 caff coffee in AM, drinks more if at a restaurant    Social Determinants of Radio broadcast assistant  Strain: Medium Risk (01/06/2021)   Overall Financial Resource Strain (CARDIA)    Difficulty of Paying Living Expenses: Somewhat hard  Food Insecurity: No Food Insecurity (01/06/2021)   Hunger Vital Sign    Worried About Running Out of Food in the Last Year: Never true    Ran Out of Food in the Last Year: Never true  Transportation Needs: No Transportation Needs (01/06/2021)   PRAPARE - Hydrologist (Medical): No    Lack of Transportation (Non-Medical): No  Physical Activity: Insufficiently Active  (01/06/2021)   Exercise Vital Sign    Days of Exercise per Week: 7 days    Minutes of Exercise per Session: 10 min  Stress: No Stress Concern Present (01/06/2021)   [place]    Feeling of Stress : Only a little  Social Connections: Moderately Isolated (01/06/2021)   Social Connection and Isolation Panel [NHANES]    Frequency of Communication with Friends and Family: More than three times a week    Frequency of Social Gatherings with Friends and Family: Twice a week    Attends Religious Services: Never    Marine scientist or Organizations: No    Attends Archivist Meetings: Never    Marital Status: Married  Human resources officer Violence: Not At Risk (01/06/2021)   Humiliation, Afraid, Rape, and Kick questionnaire    Fear of Current or Ex-Partner: No    Emotionally Abused: No    Physically Abused: No    Sexually Abused: No     BP (!) 144/82   Pulse 89   Ht '6\' 3"'$  (1.905 m)   Wt 280 lb (127 kg)   SpO2 100%   BMI 35.00 kg/m   Physical Exam:  Well appearing NAD HEENT: Unremarkable Neck:  No JVD, no thyromegally Lymphatics:  No adenopathy Back:  No CVA tenderness Lungs:  Clear with no wheezes HEART:  Regular rate rhythm, no murmurs, no rubs, no clicks Abd:  soft, positive bowel sounds, no organomegally, no rebound, no guarding Ext:  2 plus pulses, no edema, no cyanosis, no clubbing Skin:  No rashes no nodules Neuro:  CN II through XII intact, motor grossly intact   Assess/Plan:  1. PAF - He has had 3 episodes of atrial fib. I suspect that he will have more atrial fib. I discussed AA drug therapy and catheter ablation. He is not interested in either at this time.  2. Obesity - he has lost 20 lbs. He has started on mounjaro and hopefully this will help. 3. HTN - his bp is fairly well controlled 4. CAD - he denies anginal symptoms despite his CTO of the RCA.  5. Preop eval - the patient is low risk  for upcoming surgery and may proceed with an acceptable risk.    Carleene Overlie Keaunna Skipper,MD

## 2021-10-29 DIAGNOSIS — I739 Peripheral vascular disease, unspecified: Secondary | ICD-10-CM | POA: Diagnosis not present

## 2021-10-29 DIAGNOSIS — E1140 Type 2 diabetes mellitus with diabetic neuropathy, unspecified: Secondary | ICD-10-CM | POA: Diagnosis not present

## 2021-10-29 DIAGNOSIS — I70229 Atherosclerosis of native arteries of extremities with rest pain, unspecified extremity: Secondary | ICD-10-CM | POA: Diagnosis not present

## 2021-10-29 DIAGNOSIS — E119 Type 2 diabetes mellitus without complications: Secondary | ICD-10-CM | POA: Diagnosis not present

## 2021-10-29 DIAGNOSIS — I70203 Unspecified atherosclerosis of native arteries of extremities, bilateral legs: Secondary | ICD-10-CM | POA: Diagnosis not present

## 2021-10-29 DIAGNOSIS — Z48812 Encounter for surgical aftercare following surgery on the circulatory system: Secondary | ICD-10-CM | POA: Diagnosis not present

## 2021-10-29 LAB — URINE CULTURE

## 2021-10-29 MED ORDER — CIPROFLOXACIN HCL 500 MG PO TABS: 500.0000 mg | ORAL_TABLET | Freq: Two times a day (BID) | ORAL | 0 refills | Status: AC

## 2021-10-29 NOTE — Telephone Encounter (Signed)
   Primary Cardiologist: Jenkins Rouge, MD  Chart reviewed as part of pre-operative protocol coverage. Given past medical history and time since last visit, based on ACC/AHA guidelines, [name] [name] would be at acceptable risk for the planned procedure without further cardiovascular testing.   Guidelines for holding Eliquis are as follows:  Previously cleared by Dr [name] 11/04/20 note for 3 day hold for previous procedure without bridging. Per office protocol, patient can hold Eliquis for 3 days prior to procedure. He should resume as soon as safely possible after procedure given elevated CV risk.   Patient's Plavix is prescribed by vascular surgery. Therefore, recommendations for holding this medication should come from the prescribing provider.  I will route this recommendation to the requesting party via Epic fax function and remove from pre-op pool.  Please call with questions.  [name] Life, NP-C    10/29/2021, 11:19 AM [name] Office [phone] Fax [phone]

## 2021-10-29 NOTE — Addendum Note (Signed)
Addended by: Baruch Gouty on: 10/29/2021 03:11 PM   Modules accepted: Orders

## 2021-10-29 NOTE — Telephone Encounter (Signed)
Patient was scheduled for both a virtual visit for preoperative clearance as well as office visit with Dr. Lovena Le the same day.  I discussed with Dr. Lovena Le and he was agreeable to provide cardiac clearance.

## 2021-10-31 ENCOUNTER — Other Ambulatory Visit: Payer: Self-pay | Admitting: Vascular Surgery

## 2021-11-03 ENCOUNTER — Other Ambulatory Visit: Payer: Self-pay | Admitting: Vascular Surgery

## 2021-11-03 ENCOUNTER — Other Ambulatory Visit: Payer: Self-pay | Admitting: Family

## 2021-11-03 DIAGNOSIS — K219 Gastro-esophageal reflux disease without esophagitis: Secondary | ICD-10-CM

## 2021-11-04 ENCOUNTER — Other Ambulatory Visit: Payer: Self-pay | Admitting: Family

## 2021-11-04 MED ORDER — ALBUTEROL SULFATE HFA 108 (90 BASE) MCG/ACT IN AERS: 2.0000 | INHALATION_SPRAY | Freq: Four times a day (QID) | RESPIRATORY_TRACT | 0 refills | Status: DC | PRN

## 2021-11-04 NOTE — Telephone Encounter (Signed)
  Prescription Request  11/04/2021  Is this a "Controlled Substance" medicine? no  Have you seen your PCP in the last 2 weeks? no  If YES, route message to pool  -  If NO, patient needs to be scheduled for appointment.  What is the name of the medication or equipment? Albuterol Inhaler  Have you contacted your pharmacy to request a refill? yes   Which pharmacy would you like this sent to? [facility]   Patient notified that their request is being sent to the clinical staff for review and that they should receive a response within 2 business days.   Lenna Gilford' pt.

## 2021-11-06 DIAGNOSIS — I70203 Unspecified atherosclerosis of native arteries of extremities, bilateral legs: Secondary | ICD-10-CM | POA: Diagnosis not present

## 2021-11-06 DIAGNOSIS — I70229 Atherosclerosis of native arteries of extremities with rest pain, unspecified extremity: Secondary | ICD-10-CM | POA: Diagnosis not present

## 2021-11-06 DIAGNOSIS — I70702 Unspecified atherosclerosis of other type of bypass graft(s) of the extremities, left leg: Secondary | ICD-10-CM | POA: Diagnosis not present

## 2021-11-06 DIAGNOSIS — I739 Peripheral vascular disease, unspecified: Secondary | ICD-10-CM | POA: Diagnosis not present

## 2021-11-06 DIAGNOSIS — L97911 Non-pressure chronic ulcer of unspecified part of right lower leg limited to breakdown of skin: Secondary | ICD-10-CM | POA: Diagnosis not present

## 2021-11-06 DIAGNOSIS — E1140 Type 2 diabetes mellitus with diabetic neuropathy, unspecified: Secondary | ICD-10-CM | POA: Diagnosis not present

## 2021-11-06 DIAGNOSIS — L97522 Non-pressure chronic ulcer of other part of left foot with fat layer exposed: Secondary | ICD-10-CM | POA: Diagnosis not present

## 2021-11-06 DIAGNOSIS — Z48812 Encounter for surgical aftercare following surgery on the circulatory system: Secondary | ICD-10-CM | POA: Diagnosis not present

## 2021-11-10 ENCOUNTER — Other Ambulatory Visit: Payer: Self-pay | Admitting: Family

## 2021-11-12 DIAGNOSIS — I739 Peripheral vascular disease, unspecified: Secondary | ICD-10-CM | POA: Diagnosis not present

## 2021-11-12 DIAGNOSIS — L97911 Non-pressure chronic ulcer of unspecified part of right lower leg limited to breakdown of skin: Secondary | ICD-10-CM | POA: Diagnosis not present

## 2021-11-12 DIAGNOSIS — L97522 Non-pressure chronic ulcer of other part of left foot with fat layer exposed: Secondary | ICD-10-CM | POA: Diagnosis not present

## 2021-11-12 DIAGNOSIS — I70702 Unspecified atherosclerosis of other type of bypass graft(s) of the extremities, left leg: Secondary | ICD-10-CM | POA: Diagnosis not present

## 2021-11-12 DIAGNOSIS — Z48812 Encounter for surgical aftercare following surgery on the circulatory system: Secondary | ICD-10-CM | POA: Diagnosis not present

## 2021-11-12 DIAGNOSIS — E1140 Type 2 diabetes mellitus with diabetic neuropathy, unspecified: Secondary | ICD-10-CM | POA: Diagnosis not present

## 2021-11-12 DIAGNOSIS — I70229 Atherosclerosis of native arteries of extremities with rest pain, unspecified extremity: Secondary | ICD-10-CM | POA: Diagnosis not present

## 2021-11-12 DIAGNOSIS — I70203 Unspecified atherosclerosis of native arteries of extremities, bilateral legs: Secondary | ICD-10-CM | POA: Diagnosis not present

## 2021-11-12 NOTE — Telephone Encounter (Signed)
I'm helping Arbie Cookey with Pre-op....can you please review his chart and clarify If he's cleared for surgery. We got a "3rd & final request" from [place] on this. I looked at Dr. Forde Dandy o/v notes and I don't see that he documented anything about clearance

## 2021-11-13 ENCOUNTER — Encounter: Payer: Self-pay | Admitting: Family

## 2021-11-13 ENCOUNTER — Telehealth: Payer: Self-pay | Admitting: *Deleted

## 2021-11-13 ENCOUNTER — Ambulatory Visit (INDEPENDENT_AMBULATORY_CARE_PROVIDER_SITE_OTHER): Payer: Medicare Other | Admitting: Family

## 2021-11-13 VITALS — BP 118/64 | HR 73 | Temp 96.3°F | Ht 75.0 in | Wt 284.0 lb

## 2021-11-13 DIAGNOSIS — I25118 Atherosclerotic heart disease of native coronary artery with other forms of angina pectoris: Secondary | ICD-10-CM

## 2021-11-13 DIAGNOSIS — Z794 Long term (current) use of insulin: Secondary | ICD-10-CM

## 2021-11-13 DIAGNOSIS — I252 Old myocardial infarction: Secondary | ICD-10-CM

## 2021-11-13 DIAGNOSIS — I70209 Unspecified atherosclerosis of native arteries of extremities, unspecified extremity: Secondary | ICD-10-CM | POA: Diagnosis not present

## 2021-11-13 DIAGNOSIS — E0842 Diabetes mellitus due to underlying condition with diabetic polyneuropathy: Secondary | ICD-10-CM | POA: Diagnosis not present

## 2021-11-13 DIAGNOSIS — I70229 Atherosclerosis of native arteries of extremities with rest pain, unspecified extremity: Secondary | ICD-10-CM | POA: Diagnosis not present

## 2021-11-13 DIAGNOSIS — F419 Anxiety disorder, unspecified: Secondary | ICD-10-CM

## 2021-11-13 DIAGNOSIS — I152 Hypertension secondary to endocrine disorders: Secondary | ICD-10-CM

## 2021-11-13 DIAGNOSIS — E1159 Type 2 diabetes mellitus with other circulatory complications: Secondary | ICD-10-CM | POA: Diagnosis not present

## 2021-11-13 DIAGNOSIS — K219 Gastro-esophageal reflux disease without esophagitis: Secondary | ICD-10-CM

## 2021-11-13 DIAGNOSIS — I480 Paroxysmal atrial fibrillation: Secondary | ICD-10-CM | POA: Diagnosis not present

## 2021-11-13 DIAGNOSIS — E1142 Type 2 diabetes mellitus with diabetic polyneuropathy: Secondary | ICD-10-CM | POA: Diagnosis not present

## 2021-11-13 DIAGNOSIS — E1169 Type 2 diabetes mellitus with other specified complication: Secondary | ICD-10-CM

## 2021-11-13 DIAGNOSIS — F331 Major depressive disorder, recurrent, moderate: Secondary | ICD-10-CM

## 2021-11-13 DIAGNOSIS — E785 Hyperlipidemia, unspecified: Secondary | ICD-10-CM

## 2021-11-13 LAB — BAYER DCA HB A1C WAIVED: HB A1C (BAYER DCA - WAIVED): 7.5 % — ABNORMAL HIGH (ref 4.8–5.6)

## 2021-11-13 NOTE — Progress Notes (Signed)
Subjective:    Patient ID: [name] [name], male    DOB: 03/13/1951, 71 y.o.   MRN: [id number]  Chief Complaint  Patient presents with   Medical Management of Chronic Issues   PT presents to the office today for chronic follow up. PT had NSTEMI on 07/30/16 and 05/23/20 and stent placed. Pt is followed by Cardiologists every 4 months for CAD and decreased EF. He had a cath 08/08/21 that was showed no blockage.   Pt currently in a clinical trial for hyperlipidemia. He is followed by Ortho for osteoarthritis and is currently getting injections in bilateral knees and back. Pt is followed by Pain Management every 2-3 months. Followed by Nephrologists every 3 months for CKD.    He is followed by Vascular and had stent placed in his right leg and had a left femoral-popliteal  bypass on 11/22/20. He had a abdominal aortogram with lower extremity on 01/28/21.  He is followed by Vascular surgery and has a bypass 08/15/21. He has several vascular wounds on bilateral feet. Has Home Health once  a week for wound care. Improving.   He is morbid obese with a BMI of 35 and diabetic and HTN.  Diabetes He presents for his follow-up diabetic visit. He has type 2 diabetes mellitus. Hypoglycemia symptoms include nervousness/anxiousness. Associated symptoms include blurred vision, fatigue and foot paresthesias. Symptoms are worsening. Diabetic complications include heart disease, nephropathy and peripheral neuropathy. Risk factors for coronary artery disease include dyslipidemia, diabetes mellitus, male sex, hypertension and sedentary lifestyle. He is following a generally healthy diet. His overall blood glucose range is 140-180 mg/dl. Eye exam is current.  Hypertension This is a chronic problem. The current episode started more than 1 year ago. The problem has been waxing and waning since onset. The problem is uncontrolled. Associated symptoms include anxiety, blurred vision, malaise/fatigue and shortness of breath.  Pertinent negatives include no peripheral edema. Risk factors for coronary artery disease include dyslipidemia, diabetes mellitus, obesity, male gender, sedentary lifestyle and smoking/tobacco exposure. The current treatment provides moderate improvement. Hypertensive end-organ damage includes kidney disease and heart failure.  Gastroesophageal Reflux He complains of belching, heartburn and a hoarse voice. He reports no abdominal pain. This is a chronic problem. The current episode started more than 1 year ago. The problem occurs occasionally. The problem has been waxing and waning. Associated symptoms include fatigue. Risk factors include obesity. He has tried a PPI for the symptoms. The treatment provided moderate relief.  Anxiety Presents for follow-up visit. Symptoms include depressed mood, excessive worry, insomnia, irritability, nervous/anxious behavior, restlessness and shortness of breath. Symptoms occur occasionally. The severity of symptoms is moderate.    Depression        This is a chronic problem.  The current episode started more than 1 year ago.   Associated symptoms include fatigue, helplessness, hopelessness, insomnia, irritable, restlessness and sad.  Past medical history includes anxiety.   Congestive Heart Failure Presents for follow-up visit. Associated symptoms include fatigue and shortness of breath. Pertinent negatives include no abdominal pain. The symptoms have been stable.  Hyperlipidemia This is a chronic problem. The current episode started more than 1 year ago. The problem is controlled. Exacerbating diseases include obesity. Associated symptoms include shortness of breath. The current treatment provides moderate improvement of lipids.  Insomnia Primary symptoms: difficulty falling asleep, frequent awakening, malaise/fatigue.   The current episode started more than one year. The onset quality is gradual. The problem occurs intermittently. PMH includes: depression.  Review of Systems  Constitutional:  Positive for fatigue, irritability and malaise/fatigue.  HENT:  Positive for hoarse voice.   Eyes:  Positive for blurred vision.  Respiratory:  Positive for shortness of breath.   Gastrointestinal:  Positive for heartburn. Negative for abdominal pain.  Psychiatric/Behavioral:  Positive for depression. The patient is nervous/anxious and has insomnia.   All other systems reviewed and are negative.      Objective:   Physical Exam Vitals reviewed.  Constitutional:      General: He is irritable. He is not in acute distress.    Appearance: He is well-developed. He is obese.  HENT:     Head: Normocephalic.     Right Ear: Tympanic membrane normal.     Left Ear: Tympanic membrane normal.  Eyes:     General:        Right eye: No discharge.        Left eye: No discharge.     Pupils: Pupils are equal, round, and reactive to light.  Neck:     Thyroid: No thyromegaly.  Cardiovascular:     Rate and Rhythm: Normal rate and regular rhythm.     Heart sounds: Normal heart sounds. No murmur heard. Pulmonary:     Effort: Pulmonary effort is normal. No respiratory distress.     Breath sounds: Normal breath sounds. No wheezing.  Abdominal:     General: Bowel sounds are normal. There is no distension.     Palpations: Abdomen is soft.     Tenderness: There is no abdominal tenderness.  Musculoskeletal:        General: No tenderness. Normal range of motion.     Cervical back: Normal range of motion and neck supple.  Skin:    General: Skin is warm and dry.     Findings: No erythema or rash.  Neurological:     Mental Status: He is alert and oriented to person, place, and time.     Cranial Nerves: No cranial nerve deficit.     Deep Tendon Reflexes: Reflexes are normal and symmetric.  Psychiatric:        Behavior: Behavior normal.        Thought Content: Thought content normal.        Judgment: Judgment normal.     BP 118/64   Pulse 73   Temp  (!) 96.3 F (35.7 C)   Ht $R'6\' 3"'XY$  (1.905 m)   Wt 284 lb (128.8 kg)   SpO2 100%   BMI 35.50 kg/m      Assessment & Plan:  [name] [name] comes in today with chief complaint of Medical Management of Chronic Issues   Diagnosis and orders addressed:  1. Hypertension associated with diabetes ([place]) - CMP14+EGFR - CBC with Differential/Platelet  2. PAF (paroxysmal atrial fibrillation) (HCC) - CMP14+EGFR - CBC with Differential/Platelet  3. Critical lower limb ischemia (HCC) - CMP14+EGFR - CBC with Differential/Platelet  4. Coronary artery disease of native artery of native heart with stable angina pectoris (HCC) - CMP14+EGFR - CBC with Differential/Platelet  5. Atherosclerosis of arteries of extremities (HCC) - CMP14+EGFR - CBC with Differential/Platelet  6. Gastroesophageal reflux disease without esophagitis - CMP14+EGFR - CBC with Differential/Platelet  7. Hyperlipidemia associated with type 2 diabetes mellitus (HCC) - CMP14+EGFR - CBC with Differential/Platelet  8. Diabetic polyneuropathy associated with diabetes mellitus due to underlying condition ([place]) - CMP14+EGFR - CBC with Differential/Platelet  9. Diabetic peripheral neuropathy associated with type 2 diabetes mellitus (HCC) - CMP14+EGFR -  CBC with Differential/Platelet  10. Type 2 diabetes mellitus with diabetic polyneuropathy, with long-term current use of insulin (HCC) - CMP14+EGFR - CBC with Differential/Platelet - Bayer DCA Hb A1c Waived  11. Morbid obesity ([place]) - CMP14+EGFR - CBC with Differential/Platelet  12. History of non-ST elevation myocardial infarction (NSTEMI) - CMP14+EGFR - CBC with Differential/Platelet  13. Anxiety - CMP14+EGFR - CBC with Differential/Platelet  14. Moderate episode of recurrent major depressive disorder ([place]) - CMP14+EGFR - CBC with Differential/Platelet   Labs pending Health Maintenance reviewed Diet and exercise encouraged  Follow up plan: 4 months     [name], FNP

## 2021-11-13 NOTE — Patient Instructions (Signed)

## 2021-11-13 NOTE — Telephone Encounter (Signed)
LMOVM to call back to schedule TCS +/-EGD WITH Dr. [name] asa 3

## 2021-11-13 NOTE — Telephone Encounter (Signed)
Dr. Lovena Le, this patient was scheduled for a virtual visit for preop clearance on the same day he saw you in the office. I sent you a message that day with a request to address clearance since we could not conduct both visits on the same day. Could you please comment that he is cleared for surgery?  Thank you, Emmaline Life, NP-C    11/13/2021, 6:53 AM [place] [address] Office [phone] Fax [phone]

## 2021-11-14 ENCOUNTER — Other Ambulatory Visit: Payer: Self-pay | Admitting: Family

## 2021-11-14 LAB — CBC WITH DIFFERENTIAL/PLATELET
Basophils Absolute: 0.1 10*3/uL (ref 0.0–0.2)
Basos: 1 %
EOS (ABSOLUTE): 0.2 10*3/uL (ref 0.0–0.4)
Eos: 3 %
Hematocrit: 41.9 % (ref 37.5–51.0)
Hemoglobin: 12.9 g/dL — ABNORMAL LOW (ref 13.0–17.7)
Immature Grans (Abs): 0 10*3/uL (ref 0.0–0.1)
Immature Granulocytes: 0 %
Lymphocytes Absolute: 1.4 10*3/uL (ref 0.7–3.1)
Lymphs: 22 %
MCH: 24.5 pg — ABNORMAL LOW (ref 26.6–33.0)
MCHC: 30.8 g/dL — ABNORMAL LOW (ref 31.5–35.7)
MCV: 80 fL (ref 79–97)
Monocytes Absolute: 0.5 10*3/uL (ref 0.1–0.9)
Monocytes: 9 %
Neutrophils Absolute: 4 10*3/uL (ref 1.4–7.0)
Neutrophils: 65 %
Platelets: 334 10*3/uL (ref 150–450)
RBC: 5.26 x10E6/uL (ref 4.14–5.80)
RDW: 14.2 % (ref 11.6–15.4)
WBC: 6.2 10*3/uL (ref 3.4–10.8)

## 2021-11-14 LAB — CMP14+EGFR
ALT: 10 IU/L (ref 0–44)
AST: 19 IU/L (ref 0–40)
Albumin/Globulin Ratio: 1.3 (ref 1.2–2.2)
Albumin: 4.2 g/dL (ref 3.7–4.7)
Alkaline Phosphatase: 54 IU/L (ref 44–121)
BUN/Creatinine Ratio: 12 (ref 10–24)
BUN: 19 mg/dL (ref 8–27)
Bilirubin Total: 0.3 mg/dL (ref 0.0–1.2)
CO2: 23 mmol/L (ref 20–29)
Calcium: 10 mg/dL (ref 8.6–10.2)
Chloride: 99 mmol/L (ref 96–106)
Creatinine, Ser: 1.59 mg/dL — ABNORMAL HIGH (ref 0.76–1.27)
Globulin, Total: 3.3 g/dL (ref 1.5–4.5)
Glucose: 171 mg/dL — ABNORMAL HIGH (ref 70–99)
Potassium: 4.9 mmol/L (ref 3.5–5.2)
Sodium: 137 mmol/L (ref 134–144)
Total Protein: 7.5 g/dL (ref 6.0–8.5)
eGFR: 46 mL/min/{1.73_m2} — ABNORMAL LOW (ref >59)

## 2021-11-14 MED ORDER — MOUNJARO 7.5 MG/0.5ML ~~LOC~~ SOAJ: 7.5000 mg | SUBCUTANEOUS | 1 refills | Status: DC

## 2021-11-16 ENCOUNTER — Other Ambulatory Visit: Payer: Self-pay | Admitting: Family

## 2021-11-17 ENCOUNTER — Other Ambulatory Visit: Payer: Self-pay | Admitting: Family

## 2021-11-17 ENCOUNTER — Ambulatory Visit: Payer: Medicare Other | Admitting: Surgery

## 2021-11-19 ENCOUNTER — Other Ambulatory Visit: Payer: Self-pay | Admitting: Family

## 2021-11-20 DIAGNOSIS — I739 Peripheral vascular disease, unspecified: Secondary | ICD-10-CM | POA: Diagnosis not present

## 2021-11-20 DIAGNOSIS — I70203 Unspecified atherosclerosis of native arteries of extremities, bilateral legs: Secondary | ICD-10-CM | POA: Diagnosis not present

## 2021-11-20 DIAGNOSIS — L97911 Non-pressure chronic ulcer of unspecified part of right lower leg limited to breakdown of skin: Secondary | ICD-10-CM | POA: Diagnosis not present

## 2021-11-20 DIAGNOSIS — L97522 Non-pressure chronic ulcer of other part of left foot with fat layer exposed: Secondary | ICD-10-CM | POA: Diagnosis not present

## 2021-11-20 DIAGNOSIS — Z48812 Encounter for surgical aftercare following surgery on the circulatory system: Secondary | ICD-10-CM | POA: Diagnosis not present

## 2021-11-20 DIAGNOSIS — E1140 Type 2 diabetes mellitus with diabetic neuropathy, unspecified: Secondary | ICD-10-CM | POA: Diagnosis not present

## 2021-11-20 DIAGNOSIS — I70229 Atherosclerosis of native arteries of extremities with rest pain, unspecified extremity: Secondary | ICD-10-CM | POA: Diagnosis not present

## 2021-11-20 DIAGNOSIS — I70702 Unspecified atherosclerosis of other type of bypass graft(s) of the extremities, left leg: Secondary | ICD-10-CM | POA: Diagnosis not present

## 2021-11-23 NOTE — Telephone Encounter (Signed)
   Primary Cardiologist: Jenkins Rouge, MD  Chart reviewed as part of pre-operative protocol coverage. Given past medical history and time since last visit, based on ACC/AHA guidelines, [name] [name] would be at acceptable risk for the planned procedure without further cardiovascular testing.   I will route this recommendation to the requesting party via Epic fax function and remove from pre-op pool.   Per office protocol, patient can hold Eliquis for 3 days prior to procedure. He should resume as soon as safely possible after procedure given elevated CV risk.  Please call with questions.  Emmaline Life, NP-C    11/23/2021, 10:09 AM [place] [address] Office [phone] Fax [phone]

## 2021-11-24 ENCOUNTER — Encounter: Payer: Self-pay | Admitting: *Deleted

## 2021-11-24 NOTE — Telephone Encounter (Signed)
Letter mailed

## 2021-11-25 DIAGNOSIS — I739 Peripheral vascular disease, unspecified: Secondary | ICD-10-CM | POA: Diagnosis not present

## 2021-11-25 DIAGNOSIS — I70702 Unspecified atherosclerosis of other type of bypass graft(s) of the extremities, left leg: Secondary | ICD-10-CM | POA: Diagnosis not present

## 2021-11-25 DIAGNOSIS — I70203 Unspecified atherosclerosis of native arteries of extremities, bilateral legs: Secondary | ICD-10-CM | POA: Diagnosis not present

## 2021-11-25 DIAGNOSIS — L97522 Non-pressure chronic ulcer of other part of left foot with fat layer exposed: Secondary | ICD-10-CM | POA: Diagnosis not present

## 2021-11-25 DIAGNOSIS — L97911 Non-pressure chronic ulcer of unspecified part of right lower leg limited to breakdown of skin: Secondary | ICD-10-CM | POA: Diagnosis not present

## 2021-11-25 DIAGNOSIS — Z48812 Encounter for surgical aftercare following surgery on the circulatory system: Secondary | ICD-10-CM | POA: Diagnosis not present

## 2021-11-25 DIAGNOSIS — E1140 Type 2 diabetes mellitus with diabetic neuropathy, unspecified: Secondary | ICD-10-CM | POA: Diagnosis not present

## 2021-11-25 DIAGNOSIS — I70229 Atherosclerosis of native arteries of extremities with rest pain, unspecified extremity: Secondary | ICD-10-CM | POA: Diagnosis not present

## 2021-11-26 ENCOUNTER — Telehealth: Payer: Self-pay

## 2021-11-26 NOTE — Telephone Encounter (Signed)
Christy RN with [facility] called stating that she saw the pt on 7/11 and the wound under his L great toe is improving in appearance and drainage. Pt is c/o 8/10 pain in both legs and feet, and he has a small, but deep open wound on the lateral aspect of his R ankle with no drainage. Pt will continue to monitor and has upcoming appts on 7/17.

## 2021-12-01 ENCOUNTER — Encounter: Payer: Self-pay | Admitting: Surgery

## 2021-12-01 ENCOUNTER — Ambulatory Visit (INDEPENDENT_AMBULATORY_CARE_PROVIDER_SITE_OTHER): Payer: Medicare Other | Admitting: Surgery

## 2021-12-01 VITALS — BP 159/89 | HR 86 | Temp 97.9°F | Resp 20 | Ht 75.0 in | Wt 281.0 lb

## 2021-12-01 DIAGNOSIS — I7025 Atherosclerosis of native arteries of other extremities with ulceration: Secondary | ICD-10-CM

## 2021-12-01 NOTE — Progress Notes (Signed)
Vascular and Vein Specialist of [facility]  Patient name: [name] [name] MRN: [id number] DOB: 1951/04/28 Sex: male   REASON FOR VISIT:    Follow up  HISOTRY OF PRESENT ILLNESS:    [name] [name] is a 71 y.o. male who is back today for a wound check.   He has undergone the following procedures:   03/21/2019: Right superficial femoral artery stent (claudication) 04/18/2019: Failed angioplasty, left superficial femoral artery occlusion (claudication) 11/12/2020: Failed angioplasty, left superficial femoral artery (ulcer) 11/22/2020: Left femoral to above-knee popliteal artery bypass graft with vein, right superficial femoral artery stent (bilateral ulcers 01/28/2021: Abdominal aortogram with bilateral runoff 08/15/2021: Left femoral to below-knee popliteal artery bypass graft with 6 mm PTFE 08/18/2021: Retrograde peroneal cannulation with right tibioperoneal trunk stent   He is back today for wound check.  At his last visit, his wounds were debrided.  The patient has a history of coronary artery disease.  He underwent DES in 2010 in 2018.  He suffers from atrial fibrillation.  He is on Eliquis and has a loop recorder implanted.  He is statin and Repatha intolerant.  He is currently involved in a research study to address his hypercholesterolemia.  He is a diabetic.  PAST MEDICAL HISTORY:   Past Medical History:  Diagnosis Date   Anxiety    Arthritis    Atrial fibrillation (HCC)    CAD (coronary artery disease)    a. 2010: DES to CTO of RCA. EF 55% b. 07/2016: cath showing total occlusion within previously placed RCA stent (collaterals present), severe stenosis along LCx and OM1 (treated with 2 overlapping DES). c. repeat cath in 01/2018 showing patent stents along LCx and OM with CTO of D2, CTO of distal LCx, and CTO of RCA with collaterals present overall unchanged since 2018 with medical management recom   Cellulitis and abscess rt groin    Complication of  anesthesia    " I woke up during a colonoscopy "      Depression    Diabetes mellitus    Diastolic CHF ([place])    Disorders of iron metabolism    Dysrhythmia    Fibromyalgia    GERD (gastroesophageal reflux disease)    History of hiatal hernia    Hyperlipidemia    Hypertension    Low serum testosterone level    Medically noncompliant    Myocardial infarction [facility])    05-23-20   Pneumonia      FAMILY HISTORY:   Family History  Problem Relation Age of Onset   Diabetes Father    Valvular heart disease Father    Arthritis Father    Heart disease Father    Stroke Father    Alzheimer's disease Mother    Hyperlipidemia Mother    Hypertension Mother    Arthritis Mother    Lung cancer Mother    Stroke Mother    Headache Mother    Arthritis/Rheumatoid Sister    Diabetes Sister    Hypertension Sister    Hyperlipidemia Sister    Depression Sister    Dementia Maternal Aunt    Dementia Maternal Uncle    Heart disease Maternal Uncle    Stomach cancer Paternal Uncle    Colon cancer Neg Hx    Liver disease Neg Hx     SOCIAL HISTORY:   Social History   Tobacco Use   Smoking status: Former    Packs/day: 0.25    Years: 51.00    Total pack years: 12.75  Types: Cigarettes    Quit date: 08/14/2016    Years since quitting: 5.3   Smokeless tobacco: Never   Tobacco comments:    smokes  a pack a week  Substance Use Topics   Alcohol use: No    Alcohol/week: 0.0 standard drinks of alcohol     ALLERGIES:   Allergies  Allergen Reactions   Shellfish Allergy Anaphylaxis and Other (See Comments)    Tongue swelling, hives    Sulfa Antibiotics Anaphylaxis and Rash    Tongue swelling, hives   Ace Inhibitors Other (See Comments) and Cough    CKD, renal failure    Escitalopram Other (See Comments)    Buzzing in ears,headache, felt like a zombie    Evolocumab Other (See Comments)    Myalgias, flu like sx    Fenofibrate Other (See Comments)    Body aches - pt currently  taking isnt sure if its causing any pain    Invokana [Canagliflozin] Other (See Comments)    Syncope / dehydration   Lisinopril Cough   Metformin And Related Itching   Pravastatin Sodium Other (See Comments)    myalgias   Crestor [Rosuvastatin] Other (See Comments)    Myalgias    Horse-Derived Products Rash    horse serum   Lexapro [Escitalopram Oxalate] Other (See Comments)    Buzzing in ears,headache, felt like a zombie   Lipitor [Atorvastatin] Other (See Comments)    myalgias   Livalo [Pitavastatin] Other (See Comments)    Myalgias    Milk (Cow) Nausea Only    Ties stomach in knots     Tape Rash     CURRENT MEDICATIONS:   Current Outpatient Medications  Medication Sig Dispense Refill   albuterol (VENTOLIN HFA) 108 (90 Base) MCG/ACT inhaler Inhale 2 puffs into the lungs every 6 (six) hours as needed for wheezing or shortness of breath. 8.5 g 0   aspirin EC 81 MG tablet Take 1 tablet (81 mg total) by mouth daily. Swallow whole. 150 tablet 2   Chlorphen-Phenyleph-ASA (ALKA-SELTZER PLUS COLD PO) Take 2 tablets by mouth daily.     clopidogrel (PLAVIX) 75 MG tablet TAKE 1 TABLET BY MOUTH DAILY 30 tablet 10   dapagliflozin propanediol (FARXIGA) 5 MG TABS tablet Take 1 tablet (5 mg total) by mouth daily before breakfast. (Patient taking differently: Take 10 mg by mouth daily before breakfast.) 90 tablet 2   DULoxetine (CYMBALTA) 30 MG capsule TAKE 1 CAPSULE BY MOUTH DAILY 90 capsule 3   ELIQUIS 5 MG TABS tablet TAKE 1 TABLET BY MOUTH TWO TIMES DAILY 60 tablet 2   ezetimibe (ZETIA) 10 MG tablet Take 1 tablet (10 mg total) by mouth daily. 90 tablet 3   fenofibrate 160 MG tablet Take 1 tablet (160 mg total) by mouth daily. 90 tablet 3   fluticasone (FLONASE) 50 MCG/ACT nasal spray Place 1 spray into both nostrils daily as needed for allergies or rhinitis.     gabapentin (NEURONTIN) 300 MG capsule TAKE 1 CAPSULE BY MOUTH THREE TIMES DAILY 90 capsule 0   HYDROcodone-acetaminophen  (NORCO) 10-325 MG tablet Take 1 tablet by mouth every 4 (four) hours as needed for moderate pain. 30 tablet 0   insulin lispro (HUMALOG) 100 UNIT/ML KwikPen Inject 15-20 Units into the skin 3 (three) times daily with meals. 15 mL 2   insulin NPH-regular Human (HUMULIN 70/30) (70-30) 100 UNIT/ML injection Inject 60 Units into the skin 2 (two) times daily with a meal. 30 mL 2  Insulin Syringe-Needle U-100 28G X 1/2" 1 ML MISC Use to give insulin five times daily Dx E11.42 500 each 3   isosorbide mononitrate (IMDUR) 30 MG 24 hr tablet TAKE 1 TABLET BY MOUTH DAILY 30 tablet 2   Menthol, Topical Analgesic, (BLUE-EMU MAXIMUM STRENGTH EX) Apply 1 application. topically daily as needed (pain).     metoprolol succinate (TOPROL-XL) 50 MG 24 hr tablet Take 1 tablet by mouth once daily 90 tablet 1   metoprolol tartrate (LOPRESSOR) 50 MG tablet Take 1 tablet (50 mg total) by mouth daily as needed (a-fib). 30 tablet 6   nitroGLYCERIN (NITROSTAT) 0.4 MG SL tablet DISSOLVE ONE TABLET UNDER THE TONGUE EVERY 5 MINUTES AS NEEDED FOR CHEST PAIN.  DO NOT EXCEED A TOTAL OF 3 DOSES IN 15 MINUTES Strength: 0.4 mg 25 tablet 0   nystatin cream (MYCOSTATIN) Apply 1 application. topically 2 (two) times daily. Use until rash clears and then for 2 days after 30 g 0   pantoprazole (PROTONIX) 40 MG tablet TAKE 1 TABLET BY MOUTH DAILY 30 tablet 0   tirzepatide (MOUNJARO) 7.5 MG/0.5ML Pen Inject 7.5 mg into the skin once a week. 6 mL 1   traZODone (DESYREL) 100 MG tablet Take 1 tablet (100 mg total) by mouth at bedtime. 90 tablet 1   vitamin B-12 (CYANOCOBALAMIN) 1000 MCG tablet Take 1,000 mcg by mouth daily.     metolazone (ZAROXOLYN) 5 MG tablet Take 1 tablet (5 mg total) by mouth daily as needed (Swelling). 30 tablet 3   No current facility-administered medications for this visit.    REVIEW OF SYSTEMS:   '[X]'$  denotes positive finding, '[ ]'$  denotes negative finding Cardiac  Comments:  Chest pain or chest pressure:     Shortness of breath upon exertion:    Short of breath when lying flat:    Irregular heart rhythm:        Vascular    Pain in calf, thigh, or hip brought on by ambulation:    Pain in feet at night that wakes you up from your sleep:     Blood clot in your veins:    Leg swelling:         Pulmonary    Oxygen at home:    Productive cough:     Wheezing:         Neurologic    Sudden weakness in arms or legs:     Sudden numbness in arms or legs:     Sudden onset of difficulty speaking or slurred speech:    Temporary loss of vision in one eye:     Problems with dizziness:         Gastrointestinal    Blood in stool:     Vomited blood:         Genitourinary    Burning when urinating:     Blood in urine:        Psychiatric    Major depression:         Hematologic    Bleeding problems:    Problems with blood clotting too easily:        Skin    Rashes or ulcers:        Constitutional    Fever or chills:      PHYSICAL EXAM:   Vitals:   12/01/21 1147  BP: (!) 159/89  Pulse: 86  Resp: 20  Temp: 97.9 F (36.6 C)  SpO2: 97%  Weight: 281 lb (127.5 kg)  Height:  $'6\' 3"'a$  (1.905 m)    GENERAL: The patient is a well-nourished male, in no acute distress. The vital signs are documented above. CARDIAC: There is a regular rate and rhythm.  PULMONARY: Non-labored respirations MUSCULOSKELETAL: There are no major deformities or cyanosis. NEUROLOGIC: No focal weakness or paresthesias are detected. SKIN: see photo below PSYCHIATRIC: The patient has a normal affect.    STUDIES:   none  MEDICAL ISSUES:   PAD with ulcers: The patient is undergone multiple revascularization procedures on both legs.  We are trying to get his wounds to heal.  They have made significant progress since I last saw him.  He will continue with his current dressing regimen.  I will have him back in 3 months and repeat his vascular lab studies    Annamarie Major, IV, MD, FACS Vascular and Vein  Specialists of [facility] [phone] Pager [phone]

## 2021-12-02 ENCOUNTER — Other Ambulatory Visit: Payer: Self-pay | Admitting: Family

## 2021-12-02 DIAGNOSIS — I2510 Atherosclerotic heart disease of native coronary artery without angina pectoris: Secondary | ICD-10-CM

## 2021-12-02 DIAGNOSIS — K219 Gastro-esophageal reflux disease without esophagitis: Secondary | ICD-10-CM

## 2021-12-04 DIAGNOSIS — I70203 Unspecified atherosclerosis of native arteries of extremities, bilateral legs: Secondary | ICD-10-CM | POA: Diagnosis not present

## 2021-12-04 DIAGNOSIS — Z48812 Encounter for surgical aftercare following surgery on the circulatory system: Secondary | ICD-10-CM | POA: Diagnosis not present

## 2021-12-04 DIAGNOSIS — I70702 Unspecified atherosclerosis of other type of bypass graft(s) of the extremities, left leg: Secondary | ICD-10-CM | POA: Diagnosis not present

## 2021-12-04 DIAGNOSIS — I739 Peripheral vascular disease, unspecified: Secondary | ICD-10-CM | POA: Diagnosis not present

## 2021-12-04 DIAGNOSIS — L97522 Non-pressure chronic ulcer of other part of left foot with fat layer exposed: Secondary | ICD-10-CM | POA: Diagnosis not present

## 2021-12-04 DIAGNOSIS — E1140 Type 2 diabetes mellitus with diabetic neuropathy, unspecified: Secondary | ICD-10-CM | POA: Diagnosis not present

## 2021-12-04 DIAGNOSIS — I70229 Atherosclerosis of native arteries of extremities with rest pain, unspecified extremity: Secondary | ICD-10-CM | POA: Diagnosis not present

## 2021-12-04 DIAGNOSIS — L97911 Non-pressure chronic ulcer of unspecified part of right lower leg limited to breakdown of skin: Secondary | ICD-10-CM | POA: Diagnosis not present

## 2021-12-08 ENCOUNTER — Other Ambulatory Visit: Payer: Self-pay | Admitting: Family

## 2021-12-09 ENCOUNTER — Other Ambulatory Visit: Payer: Self-pay

## 2021-12-09 DIAGNOSIS — I739 Peripheral vascular disease, unspecified: Secondary | ICD-10-CM

## 2021-12-10 ENCOUNTER — Ambulatory Visit: Payer: Medicare Other | Admitting: Cardiovascular Disease

## 2021-12-11 DIAGNOSIS — E1140 Type 2 diabetes mellitus with diabetic neuropathy, unspecified: Secondary | ICD-10-CM | POA: Diagnosis not present

## 2021-12-11 DIAGNOSIS — I70229 Atherosclerosis of native arteries of extremities with rest pain, unspecified extremity: Secondary | ICD-10-CM | POA: Diagnosis not present

## 2021-12-11 DIAGNOSIS — I70203 Unspecified atherosclerosis of native arteries of extremities, bilateral legs: Secondary | ICD-10-CM | POA: Diagnosis not present

## 2021-12-11 DIAGNOSIS — L97911 Non-pressure chronic ulcer of unspecified part of right lower leg limited to breakdown of skin: Secondary | ICD-10-CM | POA: Diagnosis not present

## 2021-12-11 DIAGNOSIS — I739 Peripheral vascular disease, unspecified: Secondary | ICD-10-CM | POA: Diagnosis not present

## 2021-12-11 DIAGNOSIS — I70702 Unspecified atherosclerosis of other type of bypass graft(s) of the extremities, left leg: Secondary | ICD-10-CM | POA: Diagnosis not present

## 2021-12-11 DIAGNOSIS — Z48812 Encounter for surgical aftercare following surgery on the circulatory system: Secondary | ICD-10-CM | POA: Diagnosis not present

## 2021-12-11 DIAGNOSIS — L97522 Non-pressure chronic ulcer of other part of left foot with fat layer exposed: Secondary | ICD-10-CM | POA: Diagnosis not present

## 2021-12-14 ENCOUNTER — Other Ambulatory Visit: Payer: Self-pay | Admitting: Family

## 2021-12-16 DIAGNOSIS — L97522 Non-pressure chronic ulcer of other part of left foot with fat layer exposed: Secondary | ICD-10-CM | POA: Diagnosis not present

## 2021-12-16 DIAGNOSIS — E1140 Type 2 diabetes mellitus with diabetic neuropathy, unspecified: Secondary | ICD-10-CM | POA: Diagnosis not present

## 2021-12-16 DIAGNOSIS — L97911 Non-pressure chronic ulcer of unspecified part of right lower leg limited to breakdown of skin: Secondary | ICD-10-CM | POA: Diagnosis not present

## 2021-12-16 DIAGNOSIS — I70702 Unspecified atherosclerosis of other type of bypass graft(s) of the extremities, left leg: Secondary | ICD-10-CM | POA: Diagnosis not present

## 2021-12-16 DIAGNOSIS — I70203 Unspecified atherosclerosis of native arteries of extremities, bilateral legs: Secondary | ICD-10-CM | POA: Diagnosis not present

## 2021-12-16 DIAGNOSIS — Z48812 Encounter for surgical aftercare following surgery on the circulatory system: Secondary | ICD-10-CM | POA: Diagnosis not present

## 2021-12-16 DIAGNOSIS — I70229 Atherosclerosis of native arteries of extremities with rest pain, unspecified extremity: Secondary | ICD-10-CM | POA: Diagnosis not present

## 2021-12-16 DIAGNOSIS — I739 Peripheral vascular disease, unspecified: Secondary | ICD-10-CM | POA: Diagnosis not present

## 2021-12-17 ENCOUNTER — Other Ambulatory Visit: Payer: Self-pay

## 2021-12-17 ENCOUNTER — Inpatient Hospital Stay (HOSPITAL_COMMUNITY)
Admission: EM | Admit: 2021-12-17 | Discharge: 2021-12-23 | DRG: 872 | Disposition: A | Discharge status: Home / Self Care | Payer: Medicare Other | Attending: Internal Medicine | Admitting: Internal Medicine

## 2021-12-17 ENCOUNTER — Encounter (HOSPITAL_COMMUNITY): Payer: Self-pay

## 2021-12-17 ENCOUNTER — Emergency Department (HOSPITAL_COMMUNITY): Payer: Medicare Other

## 2021-12-17 ENCOUNTER — Telehealth: Payer: Self-pay | Admitting: *Deleted

## 2021-12-17 DIAGNOSIS — I70262 Atherosclerosis of native arteries of extremities with gangrene, left leg: Secondary | ICD-10-CM | POA: Diagnosis not present

## 2021-12-17 DIAGNOSIS — M419 Scoliosis, unspecified: Secondary | ICD-10-CM | POA: Diagnosis not present

## 2021-12-17 DIAGNOSIS — R4182 Altered mental status, unspecified: Secondary | ICD-10-CM | POA: Diagnosis not present

## 2021-12-17 DIAGNOSIS — I10 Essential (primary) hypertension: Secondary | ICD-10-CM | POA: Diagnosis not present

## 2021-12-17 DIAGNOSIS — S81802A Unspecified open wound, left lower leg, initial encounter: Secondary | ICD-10-CM | POA: Diagnosis present

## 2021-12-17 DIAGNOSIS — S81802S Unspecified open wound, left lower leg, sequela: Secondary | ICD-10-CM

## 2021-12-17 DIAGNOSIS — Z743 Need for continuous supervision: Secondary | ICD-10-CM | POA: Diagnosis not present

## 2021-12-17 DIAGNOSIS — Z955 Presence of coronary angioplasty implant and graft: Secondary | ICD-10-CM | POA: Diagnosis not present

## 2021-12-17 DIAGNOSIS — N1831 Chronic kidney disease, stage 3a: Secondary | ICD-10-CM | POA: Diagnosis present

## 2021-12-17 DIAGNOSIS — R404 Transient alteration of awareness: Secondary | ICD-10-CM | POA: Diagnosis not present

## 2021-12-17 DIAGNOSIS — A419 Sepsis, unspecified organism: Secondary | ICD-10-CM | POA: Diagnosis present

## 2021-12-17 DIAGNOSIS — M2012 Hallux valgus (acquired), left foot: Secondary | ICD-10-CM | POA: Diagnosis not present

## 2021-12-17 DIAGNOSIS — G8929 Other chronic pain: Secondary | ICD-10-CM | POA: Diagnosis present

## 2021-12-17 DIAGNOSIS — E1122 Type 2 diabetes mellitus with diabetic chronic kidney disease: Secondary | ICD-10-CM | POA: Diagnosis present

## 2021-12-17 DIAGNOSIS — Z882 Allergy status to sulfonamides status: Secondary | ICD-10-CM

## 2021-12-17 DIAGNOSIS — K4090 Unilateral inguinal hernia, without obstruction or gangrene, not specified as recurrent: Secondary | ICD-10-CM | POA: Diagnosis not present

## 2021-12-17 DIAGNOSIS — I517 Cardiomegaly: Secondary | ICD-10-CM | POA: Diagnosis not present

## 2021-12-17 DIAGNOSIS — I700 Atherosclerosis of aorta: Secondary | ICD-10-CM | POA: Diagnosis not present

## 2021-12-17 DIAGNOSIS — R6520 Severe sepsis without septic shock: Secondary | ICD-10-CM | POA: Diagnosis not present

## 2021-12-17 DIAGNOSIS — Z95820 Peripheral vascular angioplasty status with implants and grafts: Secondary | ICD-10-CM | POA: Diagnosis not present

## 2021-12-17 DIAGNOSIS — E1169 Type 2 diabetes mellitus with other specified complication: Secondary | ICD-10-CM | POA: Diagnosis present

## 2021-12-17 DIAGNOSIS — L89529 Pressure ulcer of left ankle, unspecified stage: Secondary | ICD-10-CM | POA: Diagnosis present

## 2021-12-17 DIAGNOSIS — E1152 Type 2 diabetes mellitus with diabetic peripheral angiopathy with gangrene: Secondary | ICD-10-CM | POA: Diagnosis present

## 2021-12-17 DIAGNOSIS — Z91011 Allergy to milk products: Secondary | ICD-10-CM | POA: Diagnosis not present

## 2021-12-17 DIAGNOSIS — I25118 Atherosclerotic heart disease of native coronary artery with other forms of angina pectoris: Secondary | ICD-10-CM | POA: Diagnosis present

## 2021-12-17 DIAGNOSIS — Z95828 Presence of other vascular implants and grafts: Secondary | ICD-10-CM

## 2021-12-17 DIAGNOSIS — I152 Hypertension secondary to endocrine disorders: Secondary | ICD-10-CM | POA: Diagnosis not present

## 2021-12-17 DIAGNOSIS — Z888 Allergy status to other drugs, medicaments and biological substances status: Secondary | ICD-10-CM | POA: Diagnosis not present

## 2021-12-17 DIAGNOSIS — F32A Depression, unspecified: Secondary | ICD-10-CM | POA: Diagnosis present

## 2021-12-17 DIAGNOSIS — L89519 Pressure ulcer of right ankle, unspecified stage: Secondary | ICD-10-CM | POA: Diagnosis present

## 2021-12-17 DIAGNOSIS — R6510 Systemic inflammatory response syndrome (SIRS) of non-infectious origin without acute organ dysfunction: Secondary | ICD-10-CM | POA: Diagnosis present

## 2021-12-17 DIAGNOSIS — I088 Other rheumatic multiple valve diseases: Secondary | ICD-10-CM | POA: Diagnosis not present

## 2021-12-17 DIAGNOSIS — M4807 Spinal stenosis, lumbosacral region: Secondary | ICD-10-CM | POA: Diagnosis not present

## 2021-12-17 DIAGNOSIS — A401 Sepsis due to streptococcus, group B: Principal | ICD-10-CM | POA: Diagnosis present

## 2021-12-17 DIAGNOSIS — M7732 Calcaneal spur, left foot: Secondary | ICD-10-CM | POA: Diagnosis not present

## 2021-12-17 DIAGNOSIS — Z7901 Long term (current) use of anticoagulants: Secondary | ICD-10-CM

## 2021-12-17 DIAGNOSIS — Z79899 Other long term (current) drug therapy: Secondary | ICD-10-CM

## 2021-12-17 DIAGNOSIS — F418 Other specified anxiety disorders: Secondary | ICD-10-CM | POA: Diagnosis not present

## 2021-12-17 DIAGNOSIS — Z87891 Personal history of nicotine dependence: Secondary | ICD-10-CM | POA: Diagnosis not present

## 2021-12-17 DIAGNOSIS — I499 Cardiac arrhythmia, unspecified: Secondary | ICD-10-CM | POA: Diagnosis not present

## 2021-12-17 DIAGNOSIS — K219 Gastro-esophageal reflux disease without esophagitis: Secondary | ICD-10-CM | POA: Diagnosis present

## 2021-12-17 DIAGNOSIS — I080 Rheumatic disorders of both mitral and aortic valves: Secondary | ICD-10-CM | POA: Diagnosis not present

## 2021-12-17 DIAGNOSIS — Z8249 Family history of ischemic heart disease and other diseases of the circulatory system: Secondary | ICD-10-CM

## 2021-12-17 DIAGNOSIS — Z91013 Allergy to seafood: Secondary | ICD-10-CM | POA: Diagnosis not present

## 2021-12-17 DIAGNOSIS — R7881 Bacteremia: Secondary | ICD-10-CM

## 2021-12-17 DIAGNOSIS — I252 Old myocardial infarction: Secondary | ICD-10-CM

## 2021-12-17 DIAGNOSIS — Z6833 Body mass index (BMI) 33.0-33.9, adult: Secondary | ICD-10-CM

## 2021-12-17 DIAGNOSIS — M797 Fibromyalgia: Secondary | ICD-10-CM | POA: Diagnosis not present

## 2021-12-17 DIAGNOSIS — G9340 Encephalopathy, unspecified: Secondary | ICD-10-CM | POA: Diagnosis not present

## 2021-12-17 DIAGNOSIS — Z794 Long term (current) use of insulin: Secondary | ICD-10-CM | POA: Diagnosis not present

## 2021-12-17 DIAGNOSIS — Z7902 Long term (current) use of antithrombotics/antiplatelets: Secondary | ICD-10-CM

## 2021-12-17 DIAGNOSIS — I340 Nonrheumatic mitral (valve) insufficiency: Secondary | ICD-10-CM | POA: Diagnosis not present

## 2021-12-17 DIAGNOSIS — I5032 Chronic diastolic (congestive) heart failure: Secondary | ICD-10-CM | POA: Diagnosis not present

## 2021-12-17 DIAGNOSIS — Z7984 Long term (current) use of oral hypoglycemic drugs: Secondary | ICD-10-CM

## 2021-12-17 DIAGNOSIS — M19072 Primary osteoarthritis, left ankle and foot: Secondary | ICD-10-CM | POA: Diagnosis not present

## 2021-12-17 DIAGNOSIS — E785 Hyperlipidemia, unspecified: Secondary | ICD-10-CM | POA: Diagnosis present

## 2021-12-17 DIAGNOSIS — Z7985 Long-term (current) use of injectable non-insulin antidiabetic drugs: Secondary | ICD-10-CM

## 2021-12-17 DIAGNOSIS — S81802D Unspecified open wound, left lower leg, subsequent encounter: Secondary | ICD-10-CM | POA: Diagnosis not present

## 2021-12-17 DIAGNOSIS — Z91048 Other nonmedicinal substance allergy status: Secondary | ICD-10-CM | POA: Diagnosis not present

## 2021-12-17 DIAGNOSIS — M5124 Other intervertebral disc displacement, thoracic region: Secondary | ICD-10-CM | POA: Diagnosis not present

## 2021-12-17 DIAGNOSIS — R1310 Dysphagia, unspecified: Secondary | ICD-10-CM | POA: Diagnosis not present

## 2021-12-17 DIAGNOSIS — Z833 Family history of diabetes mellitus: Secondary | ICD-10-CM

## 2021-12-17 DIAGNOSIS — Z20822 Contact with and (suspected) exposure to covid-19: Secondary | ICD-10-CM | POA: Diagnosis present

## 2021-12-17 DIAGNOSIS — Z01818 Encounter for other preprocedural examination: Secondary | ICD-10-CM | POA: Diagnosis not present

## 2021-12-17 DIAGNOSIS — E1159 Type 2 diabetes mellitus with other circulatory complications: Secondary | ICD-10-CM | POA: Diagnosis present

## 2021-12-17 DIAGNOSIS — Z9861 Coronary angioplasty status: Secondary | ICD-10-CM

## 2021-12-17 DIAGNOSIS — R6889 Other general symptoms and signs: Secondary | ICD-10-CM | POA: Diagnosis not present

## 2021-12-17 DIAGNOSIS — M4316 Spondylolisthesis, lumbar region: Secondary | ICD-10-CM | POA: Diagnosis not present

## 2021-12-17 DIAGNOSIS — I480 Paroxysmal atrial fibrillation: Secondary | ICD-10-CM | POA: Diagnosis present

## 2021-12-17 DIAGNOSIS — M778 Other enthesopathies, not elsewhere classified: Secondary | ICD-10-CM | POA: Diagnosis not present

## 2021-12-17 DIAGNOSIS — E1151 Type 2 diabetes mellitus with diabetic peripheral angiopathy without gangrene: Secondary | ICD-10-CM | POA: Diagnosis not present

## 2021-12-17 DIAGNOSIS — I96 Gangrene, not elsewhere classified: Secondary | ICD-10-CM | POA: Diagnosis not present

## 2021-12-17 DIAGNOSIS — Z7982 Long term (current) use of aspirin: Secondary | ICD-10-CM

## 2021-12-17 DIAGNOSIS — L8990 Pressure ulcer of unspecified site, unspecified stage: Secondary | ICD-10-CM | POA: Insufficient documentation

## 2021-12-17 DIAGNOSIS — K224 Dyskinesia of esophagus: Secondary | ICD-10-CM | POA: Diagnosis present

## 2021-12-17 DIAGNOSIS — I739 Peripheral vascular disease, unspecified: Secondary | ICD-10-CM | POA: Diagnosis not present

## 2021-12-17 DIAGNOSIS — L89890 Pressure ulcer of other site, unstageable: Secondary | ICD-10-CM | POA: Diagnosis not present

## 2021-12-17 DIAGNOSIS — I2510 Atherosclerotic heart disease of native coronary artery without angina pectoris: Secondary | ICD-10-CM

## 2021-12-17 DIAGNOSIS — E669 Obesity, unspecified: Secondary | ICD-10-CM | POA: Diagnosis present

## 2021-12-17 DIAGNOSIS — B951 Streptococcus, group B, as the cause of diseases classified elsewhere: Secondary | ICD-10-CM | POA: Diagnosis not present

## 2021-12-17 DIAGNOSIS — M2011 Hallux valgus (acquired), right foot: Secondary | ICD-10-CM | POA: Diagnosis not present

## 2021-12-17 DIAGNOSIS — M4319 Spondylolisthesis, multiple sites in spine: Secondary | ICD-10-CM | POA: Diagnosis not present

## 2021-12-17 DIAGNOSIS — Z83438 Family history of other disorder of lipoprotein metabolism and other lipidemia: Secondary | ICD-10-CM

## 2021-12-17 DIAGNOSIS — I4891 Unspecified atrial fibrillation: Secondary | ICD-10-CM | POA: Diagnosis not present

## 2021-12-17 DIAGNOSIS — M7731 Calcaneal spur, right foot: Secondary | ICD-10-CM | POA: Diagnosis not present

## 2021-12-17 LAB — COMPREHENSIVE METABOLIC PANEL
ALT: 16 U/L (ref 0–44)
AST: 24 U/L (ref 15–41)
Albumin: 4.3 g/dL (ref 3.5–5.0)
Alkaline Phosphatase: 64 U/L (ref 38–126)
Anion gap: 14 (ref 5–15)
BUN: 26 mg/dL — ABNORMAL HIGH (ref 8–23)
CO2: 24 mmol/L (ref 22–32)
Calcium: 10.1 mg/dL (ref 8.9–10.3)
Chloride: 94 mmol/L — ABNORMAL LOW (ref 98–111)
Creatinine, Ser: 1.62 mg/dL — ABNORMAL HIGH (ref 0.61–1.24)
GFR, Estimated: 45 mL/min — ABNORMAL LOW (ref >60)
Glucose, Bld: 257 mg/dL — ABNORMAL HIGH (ref 70–99)
Potassium: 4.1 mmol/L (ref 3.5–5.1)
Sodium: 132 mmol/L — ABNORMAL LOW (ref 135–145)
Total Bilirubin: 1.8 mg/dL — ABNORMAL HIGH (ref 0.3–1.2)
Total Protein: 9.1 g/dL — ABNORMAL HIGH (ref 6.5–8.1)

## 2021-12-17 LAB — BLOOD GAS, VENOUS
Acid-Base Excess: 0.7 mmol/L (ref 0.0–2.0)
Bicarbonate: 25.4 mmol/L (ref 20.0–28.0)
Drawn by: 65835
O2 Saturation: 65.9 %
Patient temperature: 39.3
pCO2, Ven: 44 mmHg (ref 44–60)
pH, Ven: 7.38 (ref 7.25–7.43)
pO2, Ven: 49 mmHg — ABNORMAL HIGH (ref 32–45)

## 2021-12-17 LAB — CBC WITH DIFFERENTIAL/PLATELET
Abs Immature Granulocytes: 0.1 10*3/uL — ABNORMAL HIGH (ref 0.00–0.07)
Basophils Absolute: 0.1 10*3/uL (ref 0.0–0.1)
Basophils Relative: 1 %
Eosinophils Absolute: 0 10*3/uL (ref 0.0–0.5)
Eosinophils Relative: 0 %
HCT: 48.7 % (ref 39.0–52.0)
Hemoglobin: 14.4 g/dL (ref 13.0–17.0)
Immature Granulocytes: 1 %
Lymphocytes Relative: 3 %
Lymphs Abs: 0.5 10*3/uL — ABNORMAL LOW (ref 0.7–4.0)
MCH: 23.3 pg — ABNORMAL LOW (ref 26.0–34.0)
MCHC: 29.6 g/dL — ABNORMAL LOW (ref 30.0–36.0)
MCV: 78.9 fL — ABNORMAL LOW (ref 80.0–100.0)
Monocytes Absolute: 0.7 10*3/uL (ref 0.1–1.0)
Monocytes Relative: 4 %
Neutro Abs: 16.2 10*3/uL — ABNORMAL HIGH (ref 1.7–7.7)
Neutrophils Relative %: 91 %
Platelets: 373 10*3/uL (ref 150–400)
RBC: 6.17 MIL/uL — ABNORMAL HIGH (ref 4.22–5.81)
RDW: 17 % — ABNORMAL HIGH (ref 11.5–15.5)
WBC: 17.7 10*3/uL — ABNORMAL HIGH (ref 4.0–10.5)
nRBC: 0 % (ref 0.0–0.2)

## 2021-12-17 LAB — GLUCOSE, CAPILLARY
Glucose-Capillary: 242 mg/dL — ABNORMAL HIGH (ref 70–99)
Glucose-Capillary: 255 mg/dL — ABNORMAL HIGH (ref 70–99)

## 2021-12-17 LAB — URINALYSIS, ROUTINE W REFLEX MICROSCOPIC
Bacteria, UA: NONE SEEN
Bilirubin Urine: NEGATIVE
Glucose, UA: >=500 mg/dL — AB
Ketones, ur: 5 mg/dL — AB
Leukocytes,Ua: NEGATIVE
Nitrite: NEGATIVE
Protein, ur: 100 mg/dL — AB
Specific Gravity, Urine: 1.018 (ref 1.005–1.030)
pH: 6 (ref 5.0–8.0)

## 2021-12-17 LAB — PROTIME-INR
INR: 1.5 — ABNORMAL HIGH (ref 0.8–1.2)
INR: 1.5 — ABNORMAL HIGH (ref 0.8–1.2)
Prothrombin Time: 17.6 seconds — ABNORMAL HIGH (ref 11.4–15.2)
Prothrombin Time: 17.6 seconds — ABNORMAL HIGH (ref 11.4–15.2)

## 2021-12-17 LAB — PROCALCITONIN: Procalcitonin: 0.56 ng/mL

## 2021-12-17 LAB — LACTIC ACID, PLASMA
Lactic Acid, Venous: 2.7 mmol/L (ref 0.5–1.9)
Lactic Acid, Venous: 3.5 mmol/L (ref 0.5–1.9)

## 2021-12-17 LAB — APTT
aPTT: 45 seconds — ABNORMAL HIGH (ref 24–36)
aPTT: 47 seconds — ABNORMAL HIGH (ref 24–36)

## 2021-12-17 LAB — CBG MONITORING, ED
Glucose-Capillary: 302 mg/dL — ABNORMAL HIGH (ref 70–99)
Glucose-Capillary: 214 mg/dL — ABNORMAL HIGH (ref 70–99)

## 2021-12-17 LAB — RESP PANEL BY RT-PCR (FLU A&B, COVID) ARPGX2
Influenza A by PCR: NEGATIVE
Influenza B by PCR: NEGATIVE
SARS Coronavirus 2 by RT PCR: NEGATIVE

## 2021-12-17 MED ORDER — ACETAMINOPHEN 650 MG RE SUPP
650.0000 mg | Freq: Four times a day (QID) | RECTAL | Status: DC | PRN
Start: 2021-12-17 — End: 2021-12-18

## 2021-12-17 MED ORDER — LACTATED RINGERS IV SOLN: INTRAVENOUS | Status: DC

## 2021-12-17 MED ORDER — ACETAMINOPHEN 325 MG PO TABS
650.0000 mg | ORAL_TABLET | Freq: Once | ORAL | Status: AC
  Administered 2021-12-17: 650 mg via ORAL
  Filled 2021-12-17: qty 2

## 2021-12-17 MED ORDER — METOPROLOL TARTRATE 5 MG/5ML IV SOLN
2.5000 mg | Freq: Four times a day (QID) | INTRAVENOUS | Status: DC
  Administered 2021-12-17 – 2021-12-18 (×3): 2.5 mg via INTRAVENOUS
  Filled 2021-12-17 (×3): qty 5

## 2021-12-17 MED ORDER — LACTATED RINGERS IV BOLUS (SEPSIS)
1000.0000 mL | Freq: Once | INTRAVENOUS | Status: AC
  Administered 2021-12-17: 1000 mL via INTRAVENOUS

## 2021-12-17 MED ORDER — METRONIDAZOLE 500 MG/100ML IV SOLN
500.0000 mg | Freq: Once | INTRAVENOUS | Status: AC
  Administered 2021-12-17: 500 mg via INTRAVENOUS
  Filled 2021-12-17: qty 100

## 2021-12-17 MED ORDER — CEFEPIME HCL 2 G IV SOLR
2.0000 g | Freq: Three times a day (TID) | INTRAVENOUS | Status: DC
  Administered 2021-12-18 (×2): 2 g via INTRAVENOUS
  Filled 2021-12-17 (×2): qty 12.5

## 2021-12-17 MED ORDER — HYDRALAZINE HCL 20 MG/ML IJ SOLN: 5.0000 mg | INTRAMUSCULAR | Status: DC | PRN

## 2021-12-17 MED ORDER — LACTATED RINGERS IV BOLUS
500.0000 mL | Freq: Once | INTRAVENOUS | Status: AC
Start: 2021-12-17 — End: 2021-12-17
  Administered 2021-12-17: 500 mL via INTRAVENOUS

## 2021-12-17 MED ORDER — ACETAMINOPHEN 325 MG PO TABS
650.0000 mg | ORAL_TABLET | Freq: Four times a day (QID) | ORAL | Status: DC | PRN
  Administered 2021-12-18 – 2021-12-21 (×3): 650 mg via ORAL
  Filled 2021-12-17 (×3): qty 2

## 2021-12-17 MED ORDER — ONDANSETRON HCL 4 MG PO TABS: 4.0000 mg | ORAL_TABLET | Freq: Four times a day (QID) | ORAL | Status: DC | PRN

## 2021-12-17 MED ORDER — SODIUM CHLORIDE 0.9 % IV SOLN
2.0000 g | Freq: Once | INTRAVENOUS | Status: AC
  Administered 2021-12-17: 2 g via INTRAVENOUS
  Filled 2021-12-17: qty 12.5

## 2021-12-17 MED ORDER — ONDANSETRON HCL 4 MG/2ML IJ SOLN: 4.0000 mg | Freq: Four times a day (QID) | INTRAMUSCULAR | Status: DC | PRN

## 2021-12-17 MED ORDER — METOPROLOL TARTRATE 5 MG/5ML IV SOLN
5.0000 mg | Freq: Four times a day (QID) | INTRAVENOUS | Status: DC
Start: 2021-12-17 — End: 2021-12-17

## 2021-12-17 MED ORDER — SODIUM CHLORIDE 0.9 % IV BOLUS: 500.0000 mL | Freq: Once | INTRAVENOUS | Status: DC

## 2021-12-17 MED ORDER — VANCOMYCIN HCL IN DEXTROSE 1-5 GM/200ML-% IV SOLN: 1000.0000 mg | Freq: Once | INTRAVENOUS | Status: DC

## 2021-12-17 MED ORDER — ENOXAPARIN SODIUM 150 MG/ML IJ SOSY
1.0000 mg/kg | PREFILLED_SYRINGE | Freq: Two times a day (BID) | INTRAMUSCULAR | Status: DC
  Administered 2021-12-17 – 2021-12-18 (×2): 135 mg via SUBCUTANEOUS
  Filled 2021-12-17 (×3): qty 0.9

## 2021-12-17 MED ORDER — VANCOMYCIN HCL 1500 MG/300ML IV SOLN
1500.0000 mg | INTRAVENOUS | Status: DC
  Filled 2021-12-17: qty 300

## 2021-12-17 MED ORDER — IOHEXOL 300 MG/ML  SOLN
100.0000 mL | Freq: Once | INTRAMUSCULAR | Status: AC | PRN
  Administered 2021-12-17: 100 mL via INTRAVENOUS

## 2021-12-17 MED ORDER — INSULIN ASPART 100 UNIT/ML IJ SOLN
0.0000 [IU] | INTRAMUSCULAR | Status: DC
  Administered 2021-12-17: 5 [IU] via SUBCUTANEOUS
  Administered 2021-12-17 – 2021-12-18 (×3): 3 [IU] via SUBCUTANEOUS
  Filled 2021-12-17: qty 1

## 2021-12-17 MED ORDER — VANCOMYCIN HCL 2000 MG/400ML IV SOLN
2000.0000 mg | Freq: Once | INTRAVENOUS | Status: AC
  Administered 2021-12-17: 2000 mg via INTRAVENOUS
  Filled 2021-12-17: qty 400

## 2021-12-17 NOTE — Progress Notes (Signed)
Pharmacy Antibiotic Note  [name] [name] is a 71 y.o. male admitted on 12/17/2021 with  unknown source .  Pharmacy has been consulted for Cefepime and vancomycin dosing.  Plan: Cefepime 2gm IV q8h Vancomycin '2000mg'$  IV loading dose, then '1500mg'$  IV Q 24 hrs. Goal AUC 400-550. Expected AUC: 489 SCr used: 1.62  Cefepime 2gm IV q8h F/U cxs and clinical progress Monitor V/S, labs and levels as indicated  Height: '6\' 3"'$  (190.5 cm) Weight: 133.8 kg (295 lb) IBW/kg (Calculated) : 84.5  Temp (24hrs), Avg:100.6 F (38.1 C), Min:100.6 F (38.1 C), Max:100.6 F (38.1 C)  Recent Labs  Lab 12/17/21 1317 12/17/21 1343  WBC  --  17.7*  CREATININE  --  1.62*  LATICACIDVEN 2.7*  --     Estimated Creatinine Clearance: 61.6 mL/min (A) (by C-G formula based on SCr of 1.62 mg/dL (H)).    Allergies  Allergen Reactions   Shellfish Allergy Anaphylaxis and Other (See Comments)    Tongue swelling, hives    Sulfa Antibiotics Anaphylaxis and Rash    Tongue swelling, hives   Ace Inhibitors Other (See Comments) and Cough    CKD, renal failure    Escitalopram Other (See Comments)    Buzzing in ears,headache, felt like a zombie    Evolocumab Other (See Comments)    Myalgias, flu like sx    Fenofibrate Other (See Comments)    Body aches - pt currently taking isnt sure if its causing any pain    Invokana [Canagliflozin] Other (See Comments)    Syncope / dehydration   Lisinopril Cough   Metformin And Related Itching   Pravastatin Sodium Other (See Comments)    myalgias   Crestor [Rosuvastatin] Other (See Comments)    Myalgias    Horse-Derived Products Rash    horse serum   Lexapro [Escitalopram Oxalate] Other (See Comments)    Buzzing in ears,headache, felt like a zombie   Lipitor [Atorvastatin] Other (See Comments)    myalgias   Livalo [Pitavastatin] Other (See Comments)    Myalgias    Milk (Cow) Nausea Only    Ties stomach in knots     Tape Rash    Antimicrobials this  admission: Vancomycin  8/2 >>  cefepime 8/2 >>   Microbiology results: 8/2 BCx: pending 8/2 UCx: TBC   MRSA PCR:   Thank you for allowing pharmacy to be a part of this patient's care.  Isac Sarna, BS Pharm D, BCPS Clinical Pharmacist 12/17/2021 2:32 PM

## 2021-12-17 NOTE — ED Notes (Signed)
Patient's wife given swabs to wet patient's mouth at this time.

## 2021-12-17 NOTE — ED Notes (Signed)
Report given to [facility], ETA 20 minutes

## 2021-12-17 NOTE — Progress Notes (Signed)
   12/17/21 2136  Assess: MEWS Score  Temp 99 F (37.2 C)  BP (!) 146/79  MAP (mmHg) 97  Pulse Rate 73  ECG Heart Rate (!) 108  Resp (!) 33  Level of Consciousness Responds to Voice  SpO2 99 %  O2 Device Nasal Cannula  O2 Flow Rate (L/min) 2 L/min  Assess: MEWS Score  MEWS Temp 0  MEWS Systolic 0  MEWS Pulse 1  MEWS RR 2  MEWS LOC 1  MEWS Score 4  MEWS Score Color Red  Assess: if the MEWS score is Yellow or Red  Were vital signs taken at a resting state? Yes  Focused Assessment No change from prior assessment  Does the patient meet 2 or more of the SIRS criteria? Yes  Does the patient have a confirmed or suspected source of infection? Yes  Provider and Rapid Response Notified? No (pt already has diagnosis of sepsis)  MEWS guidelines implemented *See Row Information* Yes  Treat  Pain Scale Faces  Faces Pain Scale 2  Pain Intervention(s) Repositioned  Take Vital Signs  Increase Vital Sign Frequency  Red: Q 1hr X 4 then Q 4hr X 4, if remains red, continue Q 4hrs  Escalate  MEWS: Escalate Red: discuss with charge nurse/RN and provider, consider discussing with RRT  Notify: Charge Nurse/RN  Name of Charge Nurse/RN Notified Shanell, RN  Date Charge Nurse/RN Notified 12/17/21  Time Charge Nurse/RN Notified 2206  Notify: Provider  Provider Name/Title Hal Hope, MD  Date Provider Notified 12/17/21  Time Provider Notified 2208  Method of Notification Page (secure chat)  Notification Reason Other (Comment) (MEWS red)  Document  Patient Outcome Other (Comment) (pt remains stable)  Progress note created (see row info) Yes  Assess: SIRS CRITERIA  SIRS Temperature  0  SIRS Pulse 1  SIRS Respirations  1  SIRS WBC 1  SIRS Score Sum  3

## 2021-12-17 NOTE — ED Triage Notes (Signed)
Wife called EMS for possible heart problem. EMS states patient was lying in the floor with uncontrolled a-fib. Hx of a-fib. Patient given '25mg'$  cardizem enrroute. Patient HR now 97 in triage. Cbg 302. Patient alert to pain and oral temp 100.74F

## 2021-12-17 NOTE — ED Notes (Signed)
Patient back to room 6 from CT

## 2021-12-17 NOTE — ED Notes (Signed)
Patient transported to CT 

## 2021-12-17 NOTE — ED Provider Notes (Signed)
[facility] EMERGENCY DEPARTMENT Provider Note   CSN: [id number] Arrival date & time: 12/17/21  1302     History  Chief Complaint  Patient presents with   Altered Mental Status    [name] [name] is a 71 y.o. male.  With past medical history of CAD, hyperlipidemia, hypertension, diabetes, atrial fibrillation anticoagulated on Eliquis who presents to the emergency department with altered mental status.  Presents with wife who provides the history. Level 5 caveat: Altered mental status  States that the patient woke up this morning feeling "unwell."  She states that he had chills all morning and they had to increase the temperature in the house.  States that she took his blood pressure later this morning and it was 190/100 as well as his heart rate in the 140s.  EMS was called for a possible "heart problem."  Per EMS the patient was lying on the floor with A-fib with RVR.  He was given 25 mg of Cardizem in route.  On my exam the patient tells me his name but is otherwise disoriented.  He is on 2 L nasal cannula.  I personally took his temp which was 102.5 axillary.  He grimaces to palpation of his abdomen.   Altered Mental Status      Home Medications Prior to Admission medications   Medication Sig Start Date End Date Taking? Authorizing Provider  albuterol (VENTOLIN HFA) 108 (90 Base) MCG/ACT inhaler Inhale 2 puffs into the lungs every 6 (six) hours as needed for wheezing or shortness of breath. 11/04/21   Janora Norlander, DO  aspirin EC 81 MG tablet Take 1 tablet (81 mg total) by mouth daily. Swallow whole. 08/20/21 11/13/22  Dagoberto Ligas, PA-C  Chlorphen-Phenyleph-ASA (ALKA-SELTZER PLUS COLD PO) Take 2 tablets by mouth daily.    [provider]  clopidogrel (PLAVIX) 75 MG tablet TAKE 1 TABLET BY MOUTH DAILY 11/06/21   Angelia Mould, MD  dapagliflozin propanediol (FARXIGA) 5 MG TABS tablet Take 1 tablet (5 mg total) by mouth daily before breakfast. Patient taking  differently: Take 10 mg by mouth daily before breakfast. 04/29/21   [name] A, FNP  DULoxetine (CYMBALTA) 30 MG capsule TAKE 1 CAPSULE BY MOUTH DAILY 09/19/21   [name], FNP  ELIQUIS 5 MG TABS tablet TAKE 1 TABLET BY MOUTH TWO TIMES DAILY 10/10/21   [name] A, FNP  ezetimibe (ZETIA) 10 MG tablet Take 1 tablet (10 mg total) by mouth daily. 04/28/21   Josue Hector, MD  fenofibrate 160 MG tablet Take 1 tablet (160 mg total) by mouth daily. 04/29/21   [name], FNP  fluticasone (FLONASE) 50 MCG/ACT nasal spray Place 1 spray into both nostrils daily as needed for allergies or rhinitis.    [provider]  gabapentin (NEURONTIN) 300 MG capsule TAKE 1 CAPSULE BY MOUTH 3 TIMES DAILY 12/03/21   [name] A, FNP  HYDROcodone-acetaminophen (NORCO) 10-325 MG tablet Take 1 tablet by mouth every 4 (four) hours as needed for moderate pain. 08/20/21   Dagoberto Ligas, PA-C  insulin lispro (HUMALOG) 100 UNIT/ML KwikPen INJECT 15-20 UNITS SUBCUTANEOUSLY THREE TIMES A DAY WITH MEALS 12/03/21   [name] A, FNP  insulin NPH-regular Human (HUMULIN 70/30) (70-30) 100 UNIT/ML injection Inject 60 Units into the skin 2 (two) times daily with a meal. 11/10/21   [name] A, FNP  Insulin Syringe-Needle U-100 28G X 1/2" 1 ML MISC Use to give insulin five times daily Dx O84.16 05/07/21  [name] A, FNP  isosorbide mononitrate (IMDUR) 30 MG 24 hr tablet TAKE 1 TABLET BY MOUTH DAILY 12/03/21   [name], FNP  Menthol, Topical Analgesic, (BLUE-EMU MAXIMUM STRENGTH EX) Apply 1 application. topically daily as needed (pain).    [provider]  metolazone (ZAROXOLYN) 5 MG tablet Take 1 tablet (5 mg total) by mouth daily as needed (Swelling). 09/01/21 11/30/21  Josue Hector, MD  metoprolol succinate (TOPROL-XL) 50 MG 24 hr tablet Take 1 tablet by mouth once daily 08/28/21   Josue Hector, MD  metoprolol tartrate (LOPRESSOR) 50 MG tablet Take 1 tablet (50 mg  total) by mouth daily as needed (a-fib). 04/28/21   Josue Hector, MD  nitroGLYCERIN (NITROSTAT) 0.4 MG SL tablet DISSOLVE ONE TABLET UNDER THE TONGUE EVERY 5 MINUTES AS NEEDED FOR CHEST PAIN.  DO NOT EXCEED A TOTAL OF 3 DOSES IN 15 MINUTES Strength: 0.4 mg 04/28/21   Josue Hector, MD  nystatin cream (MYCOSTATIN) Apply 1 application. topically 2 (two) times daily. Use until rash clears and then for 2 days after 10/23/21   Baruch Gouty, FNP  pantoprazole (PROTONIX) 40 MG tablet TAKE 1 TABLET BY MOUTH DAILY 12/03/21   [name] A, FNP  tirzepatide [facility]) 7.5 MG/0.5ML Pen Inject 7.5 mg into the skin once a week. 11/14/21   [name], FNP  traZODone (DESYREL) 100 MG tablet Take 1 tablet (100 mg total) by mouth at bedtime. 08/11/21   [name], FNP  vitamin B-12 (CYANOCOBALAMIN) 1000 MCG tablet Take 1,000 mcg by mouth daily.    [provider]      Allergies    Shellfish allergy, Sulfa antibiotics, Ace inhibitors, Escitalopram, Evolocumab, Fenofibrate, Invokana [canagliflozin], Lisinopril, Metformin and related, Pravastatin sodium, Crestor [rosuvastatin], Horse-derived products, Lexapro [escitalopram oxalate], Lipitor [atorvastatin], Livalo [pitavastatin], Milk (cow), and Tape    Review of Systems   Review of Systems  Unable to perform ROS: Mental status change    Physical Exam Updated Vital Signs BP (!) 159/75   Pulse (!) 106   Temp (!) 100.6 F (38.1 C) (Oral)   Resp (!) 23   Ht '6\' 3"'$  (1.905 m)   Wt 133.8 kg   SpO2 98%   BMI 36.87 kg/m  Physical Exam Vitals and nursing note reviewed.  Constitutional:      General: He is in acute distress.     Appearance: He is ill-appearing and toxic-appearing.  HENT:     Head: Normocephalic.     Nose: Nose normal.     Mouth/Throat:     Mouth: Mucous membranes are dry.     Pharynx: Oropharynx is clear.  Eyes:     General: No scleral icterus.    Extraocular Movements: Extraocular movements intact.     Pupils:  Pupils are equal, round, and reactive to light.  Cardiovascular:     Rate and Rhythm: Tachycardia present. Rhythm irregular.     Pulses: Normal pulses.     Heart sounds: No murmur heard. Pulmonary:     Effort: Pulmonary effort is normal. No respiratory distress.  Abdominal:     General: Bowel sounds are normal. There is no distension.     Palpations: Abdomen is soft.     Tenderness: There is abdominal tenderness. There is no guarding or rebound.  Musculoskeletal:     Cervical back: Neck supple.  Skin:    General: Skin is warm and dry.     Capillary Refill: Capillary refill takes less  than 2 seconds.     Coloration: Skin is mottled. Skin is not jaundiced.     Comments: Mottling to abdomen and thighs   Neurological:     General: No focal deficit present.     Mental Status: He is lethargic and confused.     GCS: GCS eye subscore is 2. GCS verbal subscore is 4. GCS motor subscore is 5.     Cranial Nerves: No facial asymmetry.    ED Results / Procedures / Treatments   Labs (all labs ordered are listed, but only abnormal results are displayed) Labs Reviewed  COMPREHENSIVE METABOLIC PANEL - Abnormal; Notable for the following components:      Result Value   Sodium 132 (*)    Chloride 94 (*)    Glucose, Bld 257 (*)    BUN 26 (*)    Creatinine, Ser 1.62 (*)    Total Protein 9.1 (*)    Total Bilirubin 1.8 (*)    GFR, Estimated 45 (*)    All other components within normal limits  LACTIC ACID, PLASMA - Abnormal; Notable for the following components:   Lactic Acid, Venous 2.7 (*)    All other components within normal limits  LACTIC ACID, PLASMA - Abnormal; Notable for the following components:   Lactic Acid, Venous 3.5 (*)    All other components within normal limits  CBC WITH DIFFERENTIAL/PLATELET - Abnormal; Notable for the following components:   WBC 17.7 (*)    RBC 6.17 (*)    MCV 78.9 (*)    MCH 23.3 (*)    MCHC 29.6 (*)    RDW 17.0 (*)    Neutro Abs 16.2 (*)    Lymphs  Abs 0.5 (*)    Abs Immature Granulocytes 0.10 (*)    All other components within normal limits  URINALYSIS, ROUTINE W REFLEX MICROSCOPIC - Abnormal; Notable for the following components:   Glucose, UA >=500 (*)    Hgb urine dipstick MODERATE (*)    Ketones, ur 5 (*)    Protein, ur 100 (*)    All other components within normal limits  BLOOD GAS, VENOUS - Abnormal; Notable for the following components:   pO2, Ven 49 (*)    All other components within normal limits  PROTIME-INR - Abnormal; Notable for the following components:   Prothrombin Time 17.6 (*)    INR 1.5 (*)    All other components within normal limits  APTT - Abnormal; Notable for the following components:   aPTT 45 (*)    All other components within normal limits  CBG MONITORING, ED - Abnormal; Notable for the following components:   Glucose-Capillary 302 (*)    All other components within normal limits  CULTURE, BLOOD (ROUTINE X 2)  CULTURE, BLOOD (ROUTINE X 2)  RESP PANEL BY RT-PCR (FLU A&B, COVID) ARPGX2  SARS CORONAVIRUS 2 BY RT PCR  URINE CULTURE  PROCALCITONIN  PROCALCITONIN   EKG None  Radiology CT CHEST ABDOMEN PELVIS W CONTRAST  Result Date: 12/17/2021 CLINICAL DATA:  Sepsis. EXAM: CT CHEST, ABDOMEN, AND PELVIS WITH CONTRAST TECHNIQUE: Multidetector CT imaging of the chest, abdomen and pelvis was performed following the standard protocol during bolus administration of intravenous contrast. RADIATION DOSE REDUCTION: This exam was performed according to the departmental dose-optimization program which includes automated exposure control, adjustment of the mA and/or kV according to patient size and/or use of iterative reconstruction technique. CONTRAST:  141m OMNIPAQUE IOHEXOL 300 MG/ML  SOLN COMPARISON:  June 21, 2018.  FINDINGS: CT CHEST FINDINGS Cardiovascular: Atherosclerosis of thoracic aorta is noted without aneurysm or dissection. Normal cardiac size. No pericardial effusion. Extensive coronary artery  calcifications are noted. Mediastinum/Nodes: No enlarged mediastinal, hilar, or axillary lymph nodes. Thyroid gland, trachea, and esophagus demonstrate no significant findings. Lungs/Pleura: No pneumothorax or pleural effusion is noted. Right lung is clear. Minimal mosaic pattern is noted in the left lung which may represent air trapping secondary to small airways disease or possibly mild multifocal inflammation. Musculoskeletal: No chest wall mass or suspicious bone lesions identified. CT ABDOMEN PELVIS FINDINGS Hepatobiliary: No focal liver abnormality is seen. No gallstones, gallbladder wall thickening, or biliary dilatation. Pancreas: Unremarkable. No pancreatic ductal dilatation or surrounding inflammatory changes. Spleen: Normal in size without focal abnormality. Adrenals/Urinary Tract: Adrenal glands are unremarkable. Kidneys are normal, without renal calculi, focal lesion, or hydronephrosis. Bladder is unremarkable. Stomach/Bowel: Stomach is within normal limits. Appendix appears normal. No evidence of bowel wall thickening, distention, or inflammatory changes. Vascular/Lymphatic: Aortic atherosclerosis. No enlarged abdominal or pelvic lymph nodes. Reproductive: Prostate is unremarkable. Other: Small fat containing right inguinal hernia is noted. No ascites is noted. Musculoskeletal: No acute or significant osseous findings. IMPRESSION: Minimal mosaic pattern is noted in the left lung which may represent air trapping secondary to small airways disease or possibly mild multifocal inflammation. Small fat containing right inguinal hernia is noted. No other acute abnormality seen in the chest, abdomen or pelvis. Aortic Atherosclerosis (ICD10-I70.0). Electronically Signed   By: Marijo Conception M.D.   On: 12/17/2021 15:37   DG Chest Port 1 View  Result Date: 12/17/2021 CLINICAL DATA:  Uncontrolled atrial fibrillation.  Possible sepsis. EXAM: PORTABLE CHEST 1 VIEW COMPARISON:  08/16/2021 FINDINGS: Cardiomegaly,  vascular congestion. Left basilar opacity. Right lung clear. No effusions or acute bony abnormality. IMPRESSION: Cardiomegaly, vascular congestion. Left basilar atelectasis or infiltrate. Electronically Signed   By: Rolm Baptise M.D.   On: 12/17/2021 14:43    Procedures .Critical Care  Performed by: Mickie Hillier, PA-C Authorized by: Mickie Hillier, PA-C   Critical care provider statement:    Critical care time (minutes):  40   Critical care time was exclusive of:  Separately billable procedures and treating other patients   Critical care was necessary to treat or prevent imminent or life-threatening deterioration of the following conditions:  Sepsis   Critical care was time spent personally by me on the following activities:  Blood draw for specimens, development of treatment plan with patient or surrogate, discussions with consultants, discussions with primary provider, evaluation of patient's response to treatment, examination of patient, interpretation of cardiac output measurements, obtaining history from patient or surrogate, review of old charts, pulse oximetry, re-evaluation of patient's condition, ordering and review of radiographic studies, ordering and review of laboratory studies and ordering and performing treatments and interventions   I assumed direction of critical care for this patient from another provider in my specialty: no     Care discussed with: admitting provider     Medications Ordered in ED Medications  ceFEPIme (MAXIPIME) 2 g in sodium chloride 0.9 % 100 mL IVPB (has no administration in time range)  vancomycin (VANCOREADY) IVPB 1500 mg/300 mL (has no administration in time range)  ceFEPIme (MAXIPIME) 2 g in sodium chloride 0.9 % 100 mL IVPB (0 g Intravenous Stopped 12/17/21 1423)  metroNIDAZOLE (FLAGYL) IVPB 500 mg (0 mg Intravenous Stopped 12/17/21 1451)  lactated ringers bolus 1,000 mL (0 mLs Intravenous Stopped 12/17/21 1453)  vancomycin (VANCOREADY) IVPB 2000 mg/400  mL (0 mg Intravenous Stopped 12/17/21 1650)  iohexol (OMNIPAQUE) 300 MG/ML solution 100 mL (100 mLs Intravenous Contrast Given 12/17/21 1508)  lactated ringers bolus 500 mL (0 mLs Intravenous Stopped 12/17/21 1700)  acetaminophen (TYLENOL) tablet 650 mg (650 mg Oral Given 12/17/21 1701)    ED Course/ Medical Decision Making/ A&P                           Medical Decision Making Amount and/or Complexity of Data Reviewed Labs: ordered. Radiology: ordered. ECG/medicine tests: ordered.  Risk OTC drugs. Prescription drug management. Decision regarding hospitalization.  This patient presents to the ED for concern of altered mental status, this involves an extensive number of treatment options, and is a complaint that carries with it a high risk of complications and morbidity.  The differential diagnosis includes ICH, seizure, sepsis, meningitis, CNS infection or mass, uremia, hepatic encephalopathy or other encephalopathy, electrolyte derangement including hypo/hyperglycemia, etc.   Co morbidities that complicate the patient evaluation Hypertension, diabetes, age relation, CAD  Additional history obtained:  Additional history obtained from: Wife at bedside External records from outside source obtained and reviewed including: Most recent admission discharge physician note   EKG: EKG: atrial fibrillation, rate 106.   Cardiac Monitoring: The patient was maintained on a cardiac monitor.  I personally viewed and interpreted the cardiac monitored which showed an underlying rhythm of: Atrial fibrillation  Lab Results: I personally ordered, reviewed, and interpreted labs. Pertinent results include: CBC with leukocytosis to 17.7 with a left shift CMP with elevated glucose to 257, creatinine 1.62 (increased from baseline), elevated T. bili to 1.8 UA without infection, urine culture pending Blood cultures pending COVID and flu negative VBG without retention Lactic 2.7 --> 3.5 Procalcitonin  pending  Imaging Studies ordered:  I ordered imaging studies which included x-Belal and CT.  I independently reviewed & interpreted imaging & am in agreement with radiology impression. Imaging shows: CXR with vascular congestion, left basilar atelectasis or infiltrate CT chest abdomen pelvis with contrast shows a minimal left lung mosaic pattern that may be multifocal inflammation versus small airway disease.  Otherwise negative  Medications  I ordered medication including vancomycin, cefepime, Flagyl for sepsis, IV fluids per sepsis, Tylenol for fever Reevaluation of the patient after medication shows that patient improved -I reviewed the patient's home medications and did not make adjustments. -I did not prescribe new home medications.  Tests Considered: N/A  Critical Interventions: Sepsis protocol  Consultations: I requested consultation with the critical care physician, Dr. Melvyn Novas,  and discussed lab and imaging findings as well as pertinent plan - they recommend: hospitalist admission, will consult during admission   SDH None identified  ED Course:  71 year old male who presents to the emergency department with altered mental status.  Physical exam notable for a obese male who is oriented to self but otherwise minimally verbally responsive.  His lung sounds are distant but overall sound clear.  Bowel sounds are present there is no distention.  Does not appear to have an acute abdomen, although he does grimace to palpation of his abdomen.  He does appear mottled on his abdomen and thighs.  He has a left foot wound that is improved from previous.  There is no surrounding cellulitis or lymphangitic spreading.  No meningismus  Sepsis work-up initiated as well as broad-spectrum antibiotics including cefepime, bank and metronidazole.  Will be judicious with fluid resuscitation given his systolic dysfunction. CBC with leukocytosis to 17 with  a left shift and patient febrile to 102.5  axillary He does have a mild increase in his creatinine without electrolyte derangement Glucose is normal COVID and flu is negative Obtain chest x-Cutter for sepsis work-up.  He does have a small left lower lobe area that could be atelectasis.  We will treat.  He is got no cough or hypoxia.  He is not tachypneic.  We will continue looking for sources of infection at this time. He was in A-fib with RVR per EMS.  Not currently. At this point in work-up, do not have a clear source so CT chest abdomen pelvis the patient. He is not hypercapnic.  Do not think this is the source of altered mental status. No meningismus concerning for meningitis or CNS infection. Urine is clean, urine culture sent CT abdomen pelvis returns and has no findings.  It does show this left lower lobe area that may be inflammation but is not conclusive for pneumonia. His initial lactic acid was 2.7.  The second returned at 3.5.  He has only received a liter of LR.  Lactate may not be clearing due to lack of fluid resuscitation due to his systolic dysfunction.  But will continue to trend.  His blood cultures are pending. He does have a foot wound that I have evaluated. It appears, from previous images that his wound is improving. There is some necrotic tissue on his toes, but no cellulitis, swelling or heat. No lymphangitic spreading. Can continue to be potential source of infection, but lower on differential.  I reevaluated the patient and his mental status is improving.  He is conversive with me after minimal fluid resuscitation and antibiotics were initiated. I then consulted with critical care and spoke with Dr. Melvyn Novas, who agrees to follow along during patient admission but would like hospitalist admission.  He also is requesting a procalcitonin at this time.  Spoke with Dr.Elgergway, hospitalist who agrees to admit the patient. After consideration of the diagnostic results and the patients response to treatment, I feel that the  patent would benefit from admission. . The patient has been appropriately medically screened and/or stabilized in the ED.  Final Clinical Impression(s) / ED Diagnoses Final diagnoses:  Sepsis with encephalopathy without septic shock, due to unspecified organism [facility])    Rx / DC Orders ED Discharge Orders     None         Mickie Hillier, PA-C 12/17/21 1744    Kommor, Debe Coder, MD 12/17/21 1759

## 2021-12-17 NOTE — Progress Notes (Signed)
ANTICOAGULATION CONSULT NOTE - Initial Consult  Pharmacy Consult for Enoxaparin Indication: atrial fibrillation  Allergies  Allergen Reactions   Shellfish Allergy Anaphylaxis and Other (See Comments)    Tongue swelling, hives    Sulfa Antibiotics Anaphylaxis and Rash    Tongue swelling, hives   Ace Inhibitors Other (See Comments) and Cough    CKD, renal failure    Escitalopram Other (See Comments)    Buzzing in ears,headache, felt like a zombie    Evolocumab Other (See Comments)    Myalgias, flu like sx    Fenofibrate Other (See Comments)    Body aches - pt currently taking isnt sure if its causing any pain    Invokana [Canagliflozin] Other (See Comments)    Syncope / dehydration   Lisinopril Cough   Metformin And Related Itching   Pravastatin Sodium Other (See Comments)    myalgias   Crestor [Rosuvastatin] Other (See Comments)    Myalgias    Horse-Derived Products Rash    horse serum   Lexapro [Escitalopram Oxalate] Other (See Comments)    Buzzing in ears,headache, felt like a zombie   Lipitor [Atorvastatin] Other (See Comments)    myalgias   Livalo [Pitavastatin] Other (See Comments)    Myalgias    Milk (Cow) Nausea Only    Ties stomach in knots     Tape Rash    Patient Measurements: Height: '6\' 3"'$  (190.5 cm) Weight: 133.8 kg (295 lb) IBW/kg (Calculated) : 84.5  Vital Signs: Temp: 101.2 F (38.4 C) (08/02 1851) Temp Source: Axillary (08/02 1851) BP: 128/80 (08/02 1900) Pulse Rate: 84 (08/02 1830)  Labs: Recent Labs    12/17/21 1343 12/17/21 1533  HGB 14.4  --   HCT 48.7  --   PLT 373  --   APTT  --  45*  LABPROT  --  17.6*  INR  --  1.5*  CREATININE 1.62*  --     Estimated Creatinine Clearance: 61.6 mL/min (A) (by C-G formula based on SCr of 1.62 mg/dL (H)).   Medical History: Past Medical History:  Diagnosis Date   Anxiety    Arthritis    Atrial fibrillation (HCC)    CAD (coronary artery disease)    a. 2010: DES to CTO of RCA. EF  55% b. 07/2016: cath showing total occlusion within previously placed RCA stent (collaterals present), severe stenosis along LCx and OM1 (treated with 2 overlapping DES). c. repeat cath in 01/2018 showing patent stents along LCx and OM with CTO of D2, CTO of distal LCx, and CTO of RCA with collaterals present overall unchanged since 2018 with medical management recom   Cellulitis and abscess rt groin    Complication of anesthesia    " I woke up during a colonoscopy "      Depression    Diabetes mellitus    Diastolic CHF ([place])    Disorders of iron metabolism    Dysrhythmia    Fibromyalgia    GERD (gastroesophageal reflux disease)    History of hiatal hernia    Hyperlipidemia    Hypertension    Low serum testosterone level    Medically noncompliant    Myocardial infarction [facility])    05-23-20   Pneumonia     Medications:  (Not in a hospital admission)  Scheduled:   insulin aspart  0-9 Units Subcutaneous Q4H   metoprolol tartrate  2.5 mg Intravenous Q6H   Infusions:   ceFEPime (MAXIPIME) IV     lactated ringers 75  mL/hr at 12/17/21 1901   [START ON 12/18/2021] vancomycin     PRN:   Assessment: 33 yom with a history of AF, NSTEMI, CAD w/ multiple stents, DM, HF, peripheral artery occlusive disease, recent femoropopliteal bypass, multiple skin ulcerations/necrotic areas. presenting with AMS. Enoxaparin per pharmacy consult placed for AF.  Patient taking eliquis at home. Pt's wife believes last taken last night.  Hgb 14.4; plt 373  Goal of Therapy:  Monitor platelets by anticoagulation protocol: Yes   Plan:  START enoxaparin 135 mg q12h ('1mg'$ /kg q12h) Monitor for s/s of bleeding and renal function F/u plan to re-start eliquis  Lorelei Pont, PharmD, BCPS 12/17/2021 7:20 PM ED Clinical Pharmacist -  [phone]

## 2021-12-17 NOTE — Telephone Encounter (Signed)
VM from [place] w/ [facility] Saw pt yesterday, BS 309 HR irregular questions AFIB, but knows she can't Dx, all other vitals normal, c/o [facility] refuses EMS, family did call on Fri he refused. HH is planning on discharging him here soon.Call her back if she needs to do anything else or call family back if he NTBS, he is willing to see Alyse Low only.

## 2021-12-17 NOTE — H&P (Addendum)
TRH H&P   Patient Demographics:    [name] [name], is a 71 y.o. male  MRN: [id number]   DOB - [birth year]  Admit Date - 12/17/2021  Outpatient Primary MD for the patient is [name], FNP  Referring MD/NP/PA: Precious Bard  Outpatient Specialists: Dr [name]    Patient coming from: home  Chief Complaint  Patient presents with   Altered Mental Status      HPI:    [name] [name]  is a 71 y.o. male, with medical history significant of paroxysmal atrial fibrillation, h/o NSTEMI with coronary artery disease with multiple stents, diabetes, diastolic heart failure, peripheral artery occlusive disease with multiple vascular interventions by Dr. [name], most recent recent femoropopliteal bypass with multiple skin ulcerations and necrotic areas , most recent hospitalization in March of this year due to wound infection and stripping I collected bacteremia. -Patient was brought by his wife today due to altered mental status, she reports she woke up this morning feeling unwell, he had chills and rigors at home, fever and increased temperature, blood pressure in the morning was elevated, and he was tachycardic as well, he was found in A-fib with RVR by EMS where he was given 25 mg of Cardizem in route, right patient is altered unable to provide any history, he is with known gangrene in his left digits, followed closely by Dr. [name],. -In ED patient was found febrile 102, tachycardic, elevated lactic acid, UA with no evidence of infection, CTA chest/abdomen/pelvis with no evidence of infection as well, his left foot appears to be with gangrene, he was started on broad-spectrum antibiotic and [facility] consulted to admit.    Review of systems:      With Past History of the following :    Past Medical History:  Diagnosis Date   Anxiety    Arthritis    Atrial fibrillation ([place])    CAD  (coronary artery disease)    a. 2010: DES to CTO of RCA. EF 55% b. 07/2016: cath showing total occlusion within previously placed RCA stent (collaterals present), severe stenosis along LCx and OM1 (treated with 2 overlapping DES). c. repeat cath in 01/2018 showing patent stents along LCx and OM with CTO of D2, CTO of distal LCx, and CTO of RCA with collaterals present overall unchanged since 2018 with medical management recom   Cellulitis and abscess rt groin    Complication of anesthesia    " I woke up during a colonoscopy "      Depression    Diabetes mellitus    Diastolic CHF ([place])    Disorders of iron metabolism    Dysrhythmia    Fibromyalgia    GERD (gastroesophageal reflux disease)    History of hiatal hernia    Hyperlipidemia    Hypertension    Low serum testosterone level    Medically noncompliant    Myocardial infarction (  [place])    05-23-20   Pneumonia       Past Surgical History:  Procedure Laterality Date   ABDOMINAL AORTOGRAM W/LOWER EXTREMITY N/A 03/21/2019   Procedure: ABDOMINAL AORTOGRAM W/LOWER EXTREMITY;  Surgeon: [name], MD;  Location: [place] [facility];  Service: Cardiovascular;  Laterality: N/A;   ABDOMINAL AORTOGRAM W/LOWER EXTREMITY N/A 11/12/2020   Procedure: ABDOMINAL AORTOGRAM W/LOWER EXTREMITY;  Surgeon: [name], MD;  Location: [place] [facility];  Service: Cardiovascular;  Laterality: N/A;   ABDOMINAL AORTOGRAM W/LOWER EXTREMITY N/A 01/28/2021   Procedure: ABDOMINAL AORTOGRAM W/LOWER EXTREMITY;  Surgeon: [name], MD;  Location: [place] [facility];  Service: Cardiovascular;  Laterality: N/A;   ABDOMINAL AORTOGRAM W/LOWER EXTREMITY Bilateral 07/29/2021   Procedure: ABDOMINAL AORTOGRAM W/LOWER EXTREMITY;  Surgeon: [name], MD;  Location: [place] [facility];  Service: Cardiovascular;  Laterality: Bilateral;   ANGIOPLASTY N/A 08/15/2021   Procedure: ATTEMPTED RIGHT PERONEAL  ANGIOPLASTY, LEFT PERONEAL ANGIOPLASTY;  Surgeon: [name], MD;  [facility];  Service: Vascular;  Laterality: N/A;   BACK SURGERY  2015   ACDF by Dr. [name]   COLONOSCOPY N/A 10/01/2014   Dr. [name]: multiple tubular adenomas removed, colonic diverticulosis, redundant colon. next tcs advised for 09/2017. PATIENT NEEDS PROPOFOL FOR FAILED CONSCIOUS SEDATION   CORONARY STENT INTERVENTION N/A 07/30/2016   Procedure: Coronary Stent Intervention;  Surgeon: [name], MD;  Location: [place] [facility];  Service: Cardiovascular;  Laterality: N/A;   CORONARY STENT PLACEMENT  2000   By Dr. Olevia Perches   EP IMPLANTABLE DEVICE N/A 05/25/2016   Procedure: Loop Recorder Insertion;  Surgeon: [name], MD;  Location: [place] [facility];  Service: Cardiovascular;  Laterality: N/A;   ESOPHAGOGASTRODUODENOSCOPY     esophagus [facility] at [facility]   ESOPHAGOGASTRODUODENOSCOPY N/A 10/01/2014   Dr. [name]: patchy mottling/erythema and minimal polypoid appearance of gastric mucosa. bx with mild inlammation but no H.pylori   FEMORAL-POPLITEAL BYPASS GRAFT Left 11/22/2020   Procedure: LEFT FEMORAL-POPLITEAL BYPASS GRAFT;  Surgeon: [name], MD;  [facility];  Service: Vascular;  Laterality: Left;   FEMORAL-POPLITEAL BYPASS GRAFT Left 08/15/2021   Procedure: REDO LEFT FEMORAL-POPLITEAL BYPASS USING PROPATEN GRAFT;  Surgeon: [name], MD;  Location: [place];  Service: Vascular;  Laterality: Left;   HERNIA REPAIR  1308   umbilical   INSERTION OF ILIAC STENT Right 11/22/2020   Procedure: INSERTION OF ELUVIA STENT INTO RIGHT DISTAL SUPERFICIAL FEMORAL ARTERY;  Surgeon: [name], MD;  Location: [place];  Service: Vascular;  Laterality: Right;   LEFT HEART CATH AND CORONARY ANGIOGRAPHY N/A 07/30/2016   Procedure: Left Heart Cath and Coronary Angiography;  Surgeon: [name], MD;  Location: [place] [facility];  Service: Cardiovascular;  Laterality: N/A;   LEFT HEART CATH AND CORONARY ANGIOGRAPHY N/A 01/19/2018   Procedure: LEFT HEART CATH AND  CORONARY ANGIOGRAPHY;  Surgeon: [name], MD;  Location: [place] [facility];  Service: Cardiovascular;  Laterality: N/A;   LEFT HEART CATH AND CORONARY ANGIOGRAPHY N/A 05/24/2020   Procedure: LEFT HEART CATH AND CORONARY ANGIOGRAPHY;  Surgeon: Burnell Blanks, MD;  Location: [place] [facility];  Service: Cardiovascular;  Laterality: N/A;   LEFT HEART CATH AND CORONARY ANGIOGRAPHY N/A 08/06/2021   Procedure: LEFT HEART CATH AND CORONARY ANGIOGRAPHY;  Surgeon: Burnell Blanks, MD;  Location: [place] [facility];  Service: Cardiovascular;  Laterality: N/A;   LESION REMOVAL  Lip and hand    LOWER EXTREMITY ANGIOGRAM Right 11/22/2020   Procedure: RIGHT LEG ANGIOGRAM;  Surgeon: [name], MD;  Location: [facility];  Service: Vascular;  Laterality: Right;   LOWER EXTREMITY ANGIOGRAM Right 08/15/2021   Procedure: RIGHT LOWER EXTREMITY ANGIOGRAM;  Surgeon: [name], MD;  [facility];  Service: Vascular;  Laterality: Right;   LOWER EXTREMITY ANGIOGRAPHY N/A 04/18/2019   Procedure: LOWER EXTREMITY ANGIOGRAPHY;  Surgeon: [name], MD;  Location: [place] [facility];  Service: Cardiovascular;  Laterality: N/A;   LOWER EXTREMITY ANGIOGRAPHY N/A 08/18/2021   Procedure: Lower Extremity Angiography;  Surgeon: Waynetta Sandy, MD;  Location: [place] [facility];  Service: Cardiovascular;  Laterality: N/A;   NECK SURGERY     PERIPHERAL VASCULAR BALLOON ANGIOPLASTY  04/18/2019   Procedure: PERIPHERAL VASCULAR BALLOON ANGIOPLASTY;  Surgeon: [name], MD;  Location: [place] [facility];  Service: Cardiovascular;;   PERIPHERAL VASCULAR BALLOON ANGIOPLASTY Left 11/12/2020   Procedure: PERIPHERAL VASCULAR BALLOON ANGIOPLASTY;  Surgeon: [name], MD;  Location: [place] [facility];  Service: Cardiovascular;  Laterality: Left;  Failed PTA of superficial femoral artery.   PERIPHERAL VASCULAR BALLOON ANGIOPLASTY Right 08/18/2021   Procedure: PERIPHERAL VASCULAR BALLOON  ANGIOPLASTY;  Surgeon: Waynetta Sandy, MD;  Location: [place] [facility];  Service: Cardiovascular;  Laterality: Right;  peroneal   PERIPHERAL VASCULAR INTERVENTION Right 03/21/2019   Procedure: PERIPHERAL VASCULAR INTERVENTION;  Surgeon: [name], MD;  Location: [place] [facility];  Service: Cardiovascular;  Laterality: Right;  superficial femoral   PERIPHERAL VASCULAR INTERVENTION Right 08/18/2021   Procedure: PERIPHERAL VASCULAR INTERVENTION;  Surgeon: Waynetta Sandy, MD;  Location: [place] [facility];  Service: Cardiovascular;  Laterality: Right;  tibial peroneal trunk      Social History:     Social History   Tobacco Use   Smoking status: Former    Packs/day: 0.25    Years: 51.00    Total pack years: 12.75    Types: Cigarettes    Quit date: 08/14/2016    Years since quitting: 5.3   Smokeless tobacco: Never   Tobacco comments:    smokes  a pack a week  Substance Use Topics   Alcohol use: No    Alcohol/week: 0.0 standard drinks of alcohol      Family History :     Family History  Problem Relation Age of Onset   Diabetes Father    Valvular heart disease Father    Arthritis Father    Heart disease Father    Stroke Father    Alzheimer's disease Mother    Hyperlipidemia Mother    Hypertension Mother    Arthritis Mother    Lung cancer Mother    Stroke Mother    Headache Mother    Arthritis/Rheumatoid Sister    Diabetes Sister    Hypertension Sister    Hyperlipidemia Sister    Depression Sister    Dementia Maternal Aunt    Dementia Maternal Uncle    Heart disease Maternal Uncle    Stomach cancer Paternal Uncle    Colon cancer Neg Hx    Liver disease Neg Hx      Home Medications:   Prior to Admission medications   Medication Sig Start Date End Date Taking? Authorizing Provider  albuterol (VENTOLIN HFA) 108 (90 Base) MCG/ACT inhaler Inhale 2 puffs into the lungs every 6 (six) hours as needed for wheezing or shortness of breath.  11/04/21  Yes Gottschalk, Ashly M, DO  Chlorphen-Phenyleph-ASA (ALKA-SELTZER PLUS COLD PO) Take 2 tablets by mouth daily.   Yes [provider]  clopidogrel (PLAVIX) 75 MG tablet TAKE 1 TABLET BY MOUTH DAILY 11/06/21  Yes Angelia Mould, MD  dapagliflozin propanediol (FARXIGA) 5 MG TABS tablet Take 1 tablet (5 mg total) by mouth daily before breakfast. Patient taking differently: Take 10 mg by mouth daily before breakfast. 04/29/21  Yes Hawks, Alyse Low A, FNP  DULoxetine (CYMBALTA) 30 MG capsule TAKE 1 CAPSULE BY MOUTH DAILY 09/19/21  Yes Hawks, Christy A, FNP  ezetimibe (ZETIA) 10 MG tablet Take 1 tablet (10 mg total) by mouth daily. 04/28/21  Yes Josue Hector, MD  fenofibrate 160 MG tablet Take 1 tablet (160 mg total) by mouth daily. 04/29/21  Yes Hawks, Christy A, FNP  furosemide (LASIX) 20 MG tablet Take 20 mg by mouth daily.   Yes [provider]  insulin lispro (HUMALOG) 100 UNIT/ML KwikPen INJECT 15-20 UNITS SUBCUTANEOUSLY THREE TIMES A DAY WITH MEALS Patient taking differently: 15-20 Units 3 (three) times daily. Sliding scale 12/03/21  Yes Hawks, Christy A, FNP  insulin NPH-regular Human (HUMULIN 70/30) (70-30) 100 UNIT/ML injection Inject 60 Units into the skin 2 (two) times daily with a meal. 11/10/21  Yes Hawks, Christy A, FNP  isosorbide mononitrate (IMDUR) 30 MG 24 hr tablet TAKE 1 TABLET BY MOUTH DAILY 12/03/21  Yes Hawks, Christy A, FNP  METOPROLOL TARTRATE PO Take 50 mg by mouth as needed (afib).   Yes [provider]  tirzepatide Darcel Bayley) 7.5 MG/0.5ML Pen Inject 7.5 mg into the skin once a week. 11/14/21  Yes Hawks, Christy A, FNP  aspirin EC 81 MG tablet Take 1 tablet (81 mg total) by mouth daily. Swallow whole. Patient not taking: Reported on 12/17/2021 08/20/21 11/13/22  Dagoberto Ligas, PA-C  ELIQUIS 5 MG TABS tablet TAKE 1 TABLET BY MOUTH TWO TIMES DAILY 10/10/21   [name] A, FNP  fluticasone (FLONASE) 50 MCG/ACT nasal spray Place 1 spray into  both nostrils daily as needed for allergies or rhinitis. Patient not taking: Reported on 12/17/2021    [provider]  gabapentin (NEURONTIN) 300 MG capsule TAKE 1 CAPSULE BY MOUTH 3 TIMES DAILY 12/03/21   [name] A, FNP  HYDROcodone-acetaminophen (NORCO) 10-325 MG tablet Take 1 tablet by mouth every 4 (four) hours as needed for moderate pain. 08/20/21   Dagoberto Ligas, PA-C  Insulin Syringe-Needle U-100 28G X 1/2" 1 ML MISC Use to give insulin five times daily Dx E11.42 05/07/21   [name], FNP  Menthol, Topical Analgesic, (BLUE-EMU MAXIMUM STRENGTH EX) Apply 1 application. topically daily as needed (pain).    [provider]  metolazone (ZAROXOLYN) 5 MG tablet Take 1 tablet (5 mg total) by mouth daily as needed (Swelling). 09/01/21 11/30/21  Josue Hector, MD  metoprolol succinate (TOPROL-XL) 50 MG 24 hr tablet Take 1 tablet by mouth once daily 08/28/21   Josue Hector, MD  nitroGLYCERIN (NITROSTAT) 0.4 MG SL tablet DISSOLVE ONE TABLET UNDER THE TONGUE EVERY 5 MINUTES AS NEEDED FOR CHEST PAIN.  DO NOT EXCEED A TOTAL OF 3 DOSES IN 15 MINUTES Strength: 0.4 mg 04/28/21   Josue Hector, MD  nystatin cream (MYCOSTATIN) Apply 1 application. topically 2 (two) times daily. Use until rash clears and then for 2 days after 10/23/21   Baruch Gouty, FNP  pantoprazole (PROTONIX) 40 MG tablet TAKE 1 TABLET BY MOUTH DAILY 12/03/21  Hawks, Christy A, FNP  traZODone (DESYREL) 100 MG tablet Take 1 tablet (100 mg total) by mouth at bedtime. 08/11/21   [name], FNP  vitamin B-12 (CYANOCOBALAMIN) 1000 MCG tablet Take 1,000 mcg by mouth daily.    [provider]     Allergies:     Allergies  Allergen Reactions   Shellfish Allergy Anaphylaxis and Other (See Comments)    Tongue swelling, hives    Sulfa Antibiotics Anaphylaxis and Rash    Tongue swelling, hives   Ace Inhibitors Other (See Comments) and Cough    CKD, renal failure    Escitalopram Other (See  Comments)    Buzzing in ears,headache, felt like a zombie    Evolocumab Other (See Comments)    Myalgias, flu like sx    Fenofibrate Other (See Comments)    Body aches - pt currently taking isnt sure if its causing any pain    Invokana [Canagliflozin] Other (See Comments)    Syncope / dehydration   Lisinopril Cough   Metformin And Related Itching   Pravastatin Sodium Other (See Comments)    myalgias   Crestor [Rosuvastatin] Other (See Comments)    Myalgias    Horse-Derived Products Rash    horse serum   Lexapro [Escitalopram Oxalate] Other (See Comments)    Buzzing in ears,headache, felt like a zombie   Lipitor [Atorvastatin] Other (See Comments)    myalgias   Livalo [Pitavastatin] Other (See Comments)    Myalgias    Milk ([place]ow) Nausea Only    Ties stomach in knots     Tape Rash     Physical Exam:   Vitals  Blood pressure 129/73, pulse 78, temperature (!) 102.2 F (39 C), temperature source Axillary, resp. rate (!) 30, height '6\' 3"'$  (1.905 m), weight 133.8 kg, SpO2 98 %.   1. General frail, ill-appearing male,  2.  Lethargic, open heart rule out palpable stimuli, otherwise does not follow any commands or answer any questions  3.  Grossly moving all extremities without significant deficits noted  4. Ears and Eyes appear Normal, Conjunctivae clear, dry oral Mucosa.  5. Supple Neck, No JVD, No cervical lymphadenopathy appriciated, No Carotid Bruits.  6. Symmetrical Chest wall movement, Good air movement bilaterally, CTAB.  7. RRR, No Gallops, Rubs or Murmurs, No Parasternal Heave.  8. Positive Bowel Sounds, Abdomen Soft, No tenderness, No organomegaly appriciated,No rebound -guarding or rigidity.  9.  No Cyanosis, Normal Skin Turgor, No Skin Rash or Bruise.  10. Good muscle tone,  joints appear normal ,  11.  Patient with multiple pressure ulcers right ankle, right digits, and gangrene and left digits, please see pictures below.          Data Review:     CBC Recent Labs  Lab 12/17/21 1343  WBC 17.7*  HGB 14.4  HCT 48.7  PLT 373  MCV 78.9*  MCH 23.3*  MCHC 29.6*  RDW 17.0*  LYMPHSABS 0.5*  MONOABS 0.7  EOSABS 0.0  BASOSABS 0.1   ------------------------------------------------------------------------------------------------------------------  Chemistries  Recent Labs  Lab 12/17/21 1343  NA 132*  K 4.1  CL 94*  CO2 24  GLUCOSE 257*  BUN 26*  CREATININE 1.62*  CALCIUM 10.1  AST 24  ALT 16  ALKPHOS 64  BILITOT 1.8*   ------------------------------------------------------------------------------------------------------------------ estimated creatinine clearance is 61.6 mL/min (A) (by C-G formula based on SCr of 1.62 mg/dL (H)). ------------------------------------------------------------------------------------------------------------------ No results for input(s): "TSH", "T4TOTAL", "T3FREE", "THYROIDAB" in the last 72 hours.  Invalid input(s): "FREET3"  Coagulation profile Recent Labs  Lab 12/17/21 1533  INR 1.5*   ------------------------------------------------------------------------------------------------------------------- No results for input(s): "DDIMER" in the last 72 hours. -------------------------------------------------------------------------------------------------------------------  Cardiac Enzymes No results for input(s): "CKMB", "TROPONINI", "MYOGLOBIN" in the last 168 hours.  Invalid input(s): "CK" ------------------------------------------------------------------------------------------------------------------    Component Value Date/Time   BNP 218.2 (H) 06/06/2020 0433     ---------------------------------------------------------------------------------------------------------------  Urinalysis    Component Value Date/Time   COLORURINE YELLOW 12/17/2021 1523   APPEARANCEUR CLEAR 12/17/2021 1523   APPEARANCEUR Clear 10/23/2021 1224   LABSPEC 1.018 12/17/2021 1523   PHURINE  6.0 12/17/2021 1523   GLUCOSEU >=500 (A) 12/17/2021 1523   HGBUR MODERATE (A) 12/17/2021 1523   BILIRUBINUR NEGATIVE 12/17/2021 1523   BILIRUBINUR Negative 10/23/2021 1224   KETONESUR 5 (A) 12/17/2021 1523   PROTEINUR 100 (A) 12/17/2021 1523   UROBILINOGEN negative 08/13/2014 1725   UROBILINOGEN 0.2 08/07/2013 1747   NITRITE NEGATIVE 12/17/2021 1523   LEUKOCYTESUR NEGATIVE 12/17/2021 1523    ----------------------------------------------------------------------------------------------------------------   Imaging Results:    CT CHEST ABDOMEN PELVIS W CONTRAST  Result Date: 12/17/2021 CLINICAL DATA:  Sepsis. EXAM: CT CHEST, ABDOMEN, AND PELVIS WITH CONTRAST TECHNIQUE: Multidetector CT imaging of the chest, abdomen and pelvis was performed following the standard protocol during bolus administration of intravenous contrast. RADIATION DOSE REDUCTION: This exam was performed according to the departmental dose-optimization program which includes automated exposure control, adjustment of the mA and/or kV according to patient size and/or use of iterative reconstruction technique. CONTRAST:  151m OMNIPAQUE IOHEXOL 300 MG/ML  SOLN COMPARISON:  June 21, 2018. FINDINGS: CT CHEST FINDINGS Cardiovascular: Atherosclerosis of thoracic aorta is noted without aneurysm or dissection. Normal cardiac size. No pericardial effusion. Extensive coronary artery calcifications are noted. Mediastinum/Nodes: No enlarged mediastinal, hilar, or axillary lymph nodes. Thyroid gland, trachea, and esophagus demonstrate no significant findings. Lungs/Pleura: No pneumothorax or pleural effusion is noted. Right lung is clear. Minimal mosaic pattern is noted in the left lung which may represent air trapping secondary to small airways disease or possibly mild multifocal inflammation. Musculoskeletal: No chest wall mass or suspicious bone lesions identified. CT ABDOMEN PELVIS FINDINGS Hepatobiliary: No focal liver abnormality is seen.  No gallstones, gallbladder wall thickening, or biliary dilatation. Pancreas: Unremarkable. No pancreatic ductal dilatation or surrounding inflammatory changes. Spleen: Normal in size without focal abnormality. Adrenals/Urinary Tract: Adrenal glands are unremarkable. Kidneys are normal, without renal calculi, focal lesion, or hydronephrosis. Bladder is unremarkable. Stomach/Bowel: Stomach is within normal limits. Appendix appears normal. No evidence of bowel wall thickening, distention, or inflammatory changes. Vascular/Lymphatic: Aortic atherosclerosis. No enlarged abdominal or pelvic lymph nodes. Reproductive: Prostate is unremarkable. Other: Small fat containing right inguinal hernia is noted. No ascites is noted. Musculoskeletal: No acute or significant osseous findings. IMPRESSION: Minimal mosaic pattern is noted in the left lung which may represent air trapping secondary to small airways disease or possibly mild multifocal inflammation. Small fat containing right inguinal hernia is noted. No other acute abnormality seen in the chest, abdomen or pelvis. Aortic Atherosclerosis (ICD10-I70.0). Electronically Signed   By: JMarijo ConceptionM.D.   On: 12/17/2021 15:37   DG Chest Port 1 View  Result Date: 12/17/2021 CLINICAL DATA:  Uncontrolled atrial fibrillation.  Possible sepsis. EXAM: PORTABLE CHEST 1 VIEW COMPARISON:  08/16/2021 FINDINGS: Cardiomegaly, vascular congestion. Left basilar opacity. Right lung clear. No effusions or acute bony abnormality. IMPRESSION: Cardiomegaly, vascular congestion. Left basilar atelectasis or infiltrate. Electronically Signed   By: KRolm BaptiseM.D.   On:  12/17/2021 14:43      Assessment & Plan:    Principal Problem:   Sepsis ([place]) Active Problems:   Hyperlipidemia associated with type 2 diabetes mellitus ([place])   Hypertension associated with diabetes ([place])   CAD S/P percutaneous coronary angioplasty   GERD (gastroesophageal reflux disease)   Morbid obesity ([place])    Leg wound, left   Coronary artery disease of native artery of native heart with stable angina pectoris ([place])      Sepsis, present on admission Secondary to lower extremity wounds, left digits gangrene -SIRS present on admission, with fever, elevated lactic acid, altered mental status, tachycardia and leukocytosis -Patient with hospitalization in the past due to wound/osteomyelitis infection with bacteremia due to Streptococcus agalactiae can. -Continue with broad-spectrum antibiotics, vancomycin and cefepime, can be narrowed once he is more stable and cultures are available. -Continue with IV fluids, trend lactic acid.  Left digits gangrene Peripheral artery occlusive disease status post femoropopliteal bypass -Patient with known history of severe peripheral vascular disease, status post multiple vascular interventions by Dr. [name]. -He remains on Plavix and Eliquis, he remains on hold as he is altered, unsafe to swallow yet, will Lovenox meanwhile as he might need surgical intervention. -[facility] admit to [facility] to be seen by vascular surgery.     Paroxysmal atrial fibrillation on anticoagulation -Currently normal sinus rhythm -Resume Toprol-XL when able to swallow oral, meanwhile we will keep on scheduled IV metoprolol -Heparin Lovenox for now is n.p.o., as well pending vascular surgery evaluation   CAD, chronic systolic CHF and regional wall motion abnormalities -No evidence of volume overload on admission, he is  on Eliquis, Plavix, metoprolol, Imdur, Farxiga, ezetimibe and fenofibrate, can resume once able to take oral.   Diabetes with nephropathy -Keep an insulin sliding scale, resume procedure when able to take oral    CKD stage IIIa -Continue at baseline 1.5   Morbid obesity -BMI of 36   Hypertension -Resume Imdur and metoprolol when cannot take oral       DVT Prophylaxis Lovenox  AM Labs Ordered, also please review Full Orders  Family Communication:  Admission, patients condition and plan of care including tests being ordered have been discussed with the patient and wife who indicate understanding and agree with the plan and Code Status.  Code Status Full  Likely DC to  home  Condition GUARDED    Consults called: Cussed with Dr. Unk Lightning from vascular, morning team to notify vascular surgery when patient gets to [facility]  Admission status: inpatient    Time spent in minutes : 70 minutes   Phillips Climes M.D on 12/17/2021 at 6:06 PM   [facility] - Office  [phone]

## 2021-12-17 NOTE — ED Notes (Signed)
Patient diaphoretic and clammy. Hx of MI with stents placed.

## 2021-12-18 ENCOUNTER — Other Ambulatory Visit: Payer: Self-pay | Admitting: Family

## 2021-12-18 ENCOUNTER — Inpatient Hospital Stay (HOSPITAL_COMMUNITY): Payer: Medicare Other

## 2021-12-18 DIAGNOSIS — A419 Sepsis, unspecified organism: Secondary | ICD-10-CM | POA: Diagnosis not present

## 2021-12-18 DIAGNOSIS — R6520 Severe sepsis without septic shock: Secondary | ICD-10-CM

## 2021-12-18 DIAGNOSIS — S81802S Unspecified open wound, left lower leg, sequela: Secondary | ICD-10-CM | POA: Diagnosis not present

## 2021-12-18 DIAGNOSIS — I96 Gangrene, not elsewhere classified: Secondary | ICD-10-CM

## 2021-12-18 DIAGNOSIS — R4182 Altered mental status, unspecified: Secondary | ICD-10-CM

## 2021-12-18 DIAGNOSIS — I4891 Unspecified atrial fibrillation: Secondary | ICD-10-CM

## 2021-12-18 DIAGNOSIS — Z95820 Peripheral vascular angioplasty status with implants and grafts: Secondary | ICD-10-CM

## 2021-12-18 DIAGNOSIS — I2510 Atherosclerotic heart disease of native coronary artery without angina pectoris: Secondary | ICD-10-CM

## 2021-12-18 DIAGNOSIS — G9340 Encephalopathy, unspecified: Secondary | ICD-10-CM

## 2021-12-18 DIAGNOSIS — L8990 Pressure ulcer of unspecified site, unspecified stage: Secondary | ICD-10-CM | POA: Insufficient documentation

## 2021-12-18 DIAGNOSIS — I5032 Chronic diastolic (congestive) heart failure: Secondary | ICD-10-CM

## 2021-12-18 LAB — BLOOD CULTURE ID PANEL (REFLEXED) - BCID2

## 2021-12-18 LAB — CBC
HCT: 44 % (ref 39.0–52.0)
Hemoglobin: 13.1 g/dL (ref 13.0–17.0)
MCH: 23.3 pg — ABNORMAL LOW (ref 26.0–34.0)
MCHC: 29.8 g/dL — ABNORMAL LOW (ref 30.0–36.0)
MCV: 78.2 fL — ABNORMAL LOW (ref 80.0–100.0)
Platelets: 307 10*3/uL (ref 150–400)
RBC: 5.63 MIL/uL (ref 4.22–5.81)
RDW: 15.9 % — ABNORMAL HIGH (ref 11.5–15.5)
WBC: 19 10*3/uL — ABNORMAL HIGH (ref 4.0–10.5)
nRBC: 0 % (ref 0.0–0.2)

## 2021-12-18 LAB — COMPREHENSIVE METABOLIC PANEL
ALT: 19 U/L (ref 0–44)
AST: 51 U/L — ABNORMAL HIGH (ref 15–41)
Albumin: 3.5 g/dL (ref 3.5–5.0)
Alkaline Phosphatase: 45 U/L (ref 38–126)
Anion gap: 13 (ref 5–15)
BUN: 26 mg/dL — ABNORMAL HIGH (ref 8–23)
CO2: 25 mmol/L (ref 22–32)
Calcium: 9.6 mg/dL (ref 8.9–10.3)
Chloride: 94 mmol/L — ABNORMAL LOW (ref 98–111)
Creatinine, Ser: 1.73 mg/dL — ABNORMAL HIGH (ref 0.61–1.24)
GFR, Estimated: 42 mL/min — ABNORMAL LOW (ref >60)
Glucose, Bld: 217 mg/dL — ABNORMAL HIGH (ref 70–99)
Potassium: 4.4 mmol/L (ref 3.5–5.1)
Sodium: 132 mmol/L — ABNORMAL LOW (ref 135–145)
Total Bilirubin: 2.1 mg/dL — ABNORMAL HIGH (ref 0.3–1.2)
Total Protein: 8.2 g/dL — ABNORMAL HIGH (ref 6.5–8.1)

## 2021-12-18 LAB — GLUCOSE, CAPILLARY
Glucose-Capillary: 209 mg/dL — ABNORMAL HIGH (ref 70–99)
Glucose-Capillary: 227 mg/dL — ABNORMAL HIGH (ref 70–99)
Glucose-Capillary: 196 mg/dL — ABNORMAL HIGH (ref 70–99)
Glucose-Capillary: 218 mg/dL — ABNORMAL HIGH (ref 70–99)
Glucose-Capillary: 234 mg/dL — ABNORMAL HIGH (ref 70–99)

## 2021-12-18 LAB — PROCALCITONIN: Procalcitonin: 5.39 ng/mL

## 2021-12-18 MED ORDER — METOPROLOL SUCCINATE ER 50 MG PO TB24
50.0000 mg | ORAL_TABLET | Freq: Every day | ORAL | Status: DC
  Administered 2021-12-18 – 2021-12-23 (×6): 50 mg via ORAL
  Filled 2021-12-18 (×6): qty 1

## 2021-12-18 MED ORDER — DULOXETINE HCL 30 MG PO CPEP
30.0000 mg | ORAL_CAPSULE | Freq: Every day | ORAL | Status: DC
  Administered 2021-12-18 – 2021-12-23 (×6): 30 mg via ORAL
  Filled 2021-12-18 (×6): qty 1

## 2021-12-18 MED ORDER — INSULIN ASPART 100 UNIT/ML IJ SOLN
0.0000 [IU] | Freq: Every day | INTRAMUSCULAR | Status: DC
  Administered 2021-12-18: 2 [IU] via SUBCUTANEOUS
  Administered 2021-12-19 – 2021-12-21 (×3): 3 [IU] via SUBCUTANEOUS
  Administered 2021-12-22: 2 [IU] via SUBCUTANEOUS

## 2021-12-18 MED ORDER — GABAPENTIN 300 MG PO CAPS
300.0000 mg | ORAL_CAPSULE | Freq: Three times a day (TID) | ORAL | Status: DC
  Administered 2021-12-18 – 2021-12-23 (×17): 300 mg via ORAL
  Filled 2021-12-18 (×18): qty 1

## 2021-12-18 MED ORDER — ISOSORBIDE MONONITRATE ER 30 MG PO TB24
30.0000 mg | ORAL_TABLET | Freq: Every day | ORAL | Status: DC
  Administered 2021-12-18 – 2021-12-23 (×6): 30 mg via ORAL
  Filled 2021-12-18 (×6): qty 1

## 2021-12-18 MED ORDER — PENICILLIN G POTASSIUM 20000000 UNITS IJ SOLR
4.0000 10*6.[IU] | INTRAVENOUS | Status: DC
  Administered 2021-12-18 – 2021-12-19 (×5): 4 10*6.[IU] via INTRAVENOUS
  Filled 2021-12-18 (×11): qty 4

## 2021-12-18 MED ORDER — CLOPIDOGREL BISULFATE 75 MG PO TABS
75.0000 mg | ORAL_TABLET | Freq: Every day | ORAL | Status: DC
  Administered 2021-12-18 – 2021-12-23 (×6): 75 mg via ORAL
  Filled 2021-12-18 (×6): qty 1

## 2021-12-18 MED ORDER — HYDROCODONE-ACETAMINOPHEN 10-325 MG PO TABS
1.0000 | ORAL_TABLET | ORAL | Status: DC | PRN
  Administered 2021-12-18 – 2021-12-20 (×6): 1 via ORAL
  Filled 2021-12-18 (×6): qty 1

## 2021-12-18 MED ORDER — SODIUM CHLORIDE 0.9 % IV SOLN: INTRAVENOUS | Status: DC | PRN

## 2021-12-18 MED ORDER — ENOXAPARIN SODIUM 120 MG/0.8ML IJ SOSY
120.0000 mg | PREFILLED_SYRINGE | Freq: Two times a day (BID) | INTRAMUSCULAR | Status: DC
  Administered 2021-12-18 – 2021-12-23 (×10): 120 mg via SUBCUTANEOUS
  Filled 2021-12-18 (×11): qty 0.8

## 2021-12-18 MED ORDER — INSULIN DETEMIR 100 UNIT/ML ~~LOC~~ SOLN
10.0000 [IU] | Freq: Two times a day (BID) | SUBCUTANEOUS | Status: DC
  Administered 2021-12-18 – 2021-12-20 (×5): 10 [IU] via SUBCUTANEOUS
  Filled 2021-12-18 (×6): qty 0.1

## 2021-12-18 MED ORDER — INSULIN ASPART 100 UNIT/ML IJ SOLN
0.0000 [IU] | Freq: Three times a day (TID) | INTRAMUSCULAR | Status: DC
  Administered 2021-12-19: 11 [IU] via SUBCUTANEOUS
  Administered 2021-12-19 – 2021-12-21 (×6): 7 [IU] via SUBCUTANEOUS
  Administered 2021-12-21: 11 [IU] via SUBCUTANEOUS
  Administered 2021-12-21 – 2021-12-23 (×6): 7 [IU] via SUBCUTANEOUS
  Administered 2021-12-23: 4 [IU] via SUBCUTANEOUS

## 2021-12-18 MED ORDER — FENOFIBRATE 160 MG PO TABS
160.0000 mg | ORAL_TABLET | Freq: Every day | ORAL | Status: DC
  Administered 2021-12-18 – 2021-12-23 (×6): 160 mg via ORAL
  Filled 2021-12-18 (×6): qty 1

## 2021-12-18 MED ORDER — EZETIMIBE 10 MG PO TABS
10.0000 mg | ORAL_TABLET | Freq: Every day | ORAL | Status: DC
  Administered 2021-12-18 – 2021-12-23 (×6): 10 mg via ORAL
  Filled 2021-12-18 (×6): qty 1

## 2021-12-18 MED ORDER — PANTOPRAZOLE SODIUM 40 MG PO TBEC
40.0000 mg | DELAYED_RELEASE_TABLET | Freq: Every day | ORAL | Status: DC
  Administered 2021-12-18 – 2021-12-23 (×6): 40 mg via ORAL
  Filled 2021-12-18 (×6): qty 1

## 2021-12-18 MED ORDER — INSULIN ASPART 100 UNIT/ML IJ SOLN
0.0000 [IU] | Freq: Three times a day (TID) | INTRAMUSCULAR | Status: DC
  Administered 2021-12-18: 2 [IU] via SUBCUTANEOUS
  Administered 2021-12-18: 3 [IU] via SUBCUTANEOUS

## 2021-12-18 NOTE — Consult Note (Addendum)
[facility] Nurse Consult Note: Patient receiving care in [facility] has been following this patient.  Reason for Consult: pt has multiple venous statis ulcers on BLE  Wound type: Multiple wounds on BLE Right toes are black with no signs of infection Just below the right great toe is a healing wound that was previously debrided by vascular.  Scattered scabbed areas on the lateral right foot and medial malleolus Left great toe has a small wound that is not opened Left 5th toe has a small black area posteriorly Right shin has hemosiderin staining with one area of skin that the top layer has peeled off and one area that is scabbed Left shin has hemosiderin staining with one area of skin that has peeled off. Heels are intact with no PIs Last ABI on 09/22/21: Right: Resting right ankle-brachial index indicates mild right lower extremity arterial disease. The right toe-brachial index is abnormal. Left: Resting left ankle-brachial index indicates moderate left lower extremity arterial disease. Pressure Injury POA: NA Drainage (amount, consistency, odor) None Dressing procedure/placement/frequency: Clean the BLE with soap and water, rinse and pat dry. Apply a small piece of Xeroform gauze over the areas on the BL shins that are open and secure with foam dressing.  Apply foam dressing for protection over the scabbed area on the LLE, medial malleolus, lateral side of the foot, BL heels, sacrum and right posterior upper thigh. Place Prevalon boots on both heels.  Paint the black areas on the toes including in between the toes on the right foot, with Betadine and allow to air dry.   Monitor the wound area(s) for worsening of condition such as: Signs/symptoms of infection, increase in size, development of or worsening of odor, development of pain, or increased pain at the affected locations.   Notify the medical team if any of these develop.  Thank you for the consult. [facility] nurse will not follow at this  time.   Please re-consult the [place] team if needed.  Cathlean Marseilles Tamala Julian, MSN, RN, [place], [facility] Pearl, [facility] Wound Treatment Associate Pager [phone]

## 2021-12-18 NOTE — Progress Notes (Signed)
PT Cancellation Note  Patient Details Name: [name] [name] MRN: [id number] DOB: Jan 19, 1951   Cancelled Treatment:    Reason Eval/Treat Not Completed: Patient declined, no reason specified   [name] [facility] 12/18/2021, 2:48 PM [name] [place] Office [phone]

## 2021-12-18 NOTE — Progress Notes (Signed)
[facility] for Enoxaparin Indication: atrial fibrillation  Allergies  Allergen Reactions   Shellfish Allergy Anaphylaxis and Other (See Comments)    Tongue swelling, hives    Sulfa Antibiotics Anaphylaxis and Rash    Tongue swelling, hives   Ace Inhibitors Other (See Comments) and Cough    CKD, renal failure    Escitalopram Other (See Comments)    Buzzing in ears,headache, felt like a zombie    Evolocumab Other (See Comments)    Myalgias, flu like sx    Fenofibrate Other (See Comments)    Body aches - pt currently taking isnt sure if its causing any pain    Invokana [Canagliflozin] Other (See Comments)    Syncope / dehydration   Lisinopril Cough   Metformin And Related Itching   Pravastatin Sodium Other (See Comments)    myalgias   Crestor [Rosuvastatin] Other (See Comments)    Myalgias    Horse-Derived Products Rash    horse serum   Lexapro [Escitalopram Oxalate] Other (See Comments)    Buzzing in ears,headache, felt like a zombie   Lipitor [Atorvastatin] Other (See Comments)    myalgias   Livalo [Pitavastatin] Other (See Comments)    Myalgias    Milk (Cow) Nausea Only    Ties stomach in knots     Tape Rash    Patient Measurements: Height: '6\' 3"'$  (190.5 cm) Weight: 121.8 kg (268 lb 8.3 oz) IBW/kg (Calculated) : 84.5  Vital Signs: Temp: 98 F (36.7 C) (08/03 1152) Temp Source: Axillary (08/03 1152) BP: 139/72 (08/03 1152) Pulse Rate: 81 (08/03 1152)  Labs: Recent Labs    12/17/21 1343 12/17/21 1533 12/17/21 1922 12/18/21 0305  HGB 14.4  --   --  13.1  HCT 48.7  --   --  44.0  PLT 373  --   --  307  APTT  --  45* 47*  --   LABPROT  --  17.6* 17.6*  --   INR  --  1.5* 1.5*  --   CREATININE 1.62*  --   --  1.73*     Estimated Creatinine Clearance: 55.1 mL/min (A) (by C-G formula based on SCr of 1.73 mg/dL (H)).   Medical History: Past Medical History:  Diagnosis Date   Anxiety    Arthritis    Atrial  fibrillation (HCC)    CAD (coronary artery disease)    a. 2010: DES to CTO of RCA. EF 55% b. 07/2016: cath showing total occlusion within previously placed RCA stent (collaterals present), severe stenosis along LCx and OM1 (treated with 2 overlapping DES). c. repeat cath in 01/2018 showing patent stents along LCx and OM with CTO of D2, CTO of distal LCx, and CTO of RCA with collaterals present overall unchanged since 2018 with medical management recom   Cellulitis and abscess rt groin    Complication of anesthesia    " I woke up during a colonoscopy "      Depression    Diabetes mellitus    Diastolic CHF ([place])    Disorders of iron metabolism    Dysrhythmia    Fibromyalgia    GERD (gastroesophageal reflux disease)    History of hiatal hernia    Hyperlipidemia    Hypertension    Low serum testosterone level    Medically noncompliant    Myocardial infarction [facility])    05-23-20   Pneumonia     Medications:  Medications Prior to Admission  Medication Sig Dispense  Refill Last Dose   albuterol (VENTOLIN HFA) 108 (90 Base) MCG/ACT inhaler Inhale 2 puffs into the lungs every 6 (six) hours as needed for wheezing or shortness of breath. 8.5 g 0 unknown   Chlorphen-Phenyleph-ASA (ALKA-SELTZER PLUS COLD PO) Take 2 tablets by mouth daily.   12/17/2021   clopidogrel (PLAVIX) 75 MG tablet TAKE 1 TABLET BY MOUTH DAILY 30 tablet 10 12/16/2021 at 2000   dapagliflozin propanediol (FARXIGA) 5 MG TABS tablet Take 1 tablet (5 mg total) by mouth daily before breakfast. (Patient taking differently: Take 10 mg by mouth daily before breakfast.) 90 tablet 2 12/16/2021   DULoxetine (CYMBALTA) 30 MG capsule TAKE 1 CAPSULE BY MOUTH DAILY 90 capsule 3 12/16/2021   ELIQUIS 5 MG TABS tablet TAKE 1 TABLET BY MOUTH TWO TIMES DAILY 60 tablet 2 12/16/2021 at 0830   ezetimibe (ZETIA) 10 MG tablet Take 1 tablet (10 mg total) by mouth daily. 90 tablet 3 12/16/2021   fenofibrate 160 MG tablet Take 1 tablet (160 mg total) by mouth daily. 90  tablet 3 12/16/2021   furosemide (LASIX) 20 MG tablet Take 20 mg by mouth daily.   12/16/2021   gabapentin (NEURONTIN) 300 MG capsule TAKE 1 CAPSULE BY MOUTH 3 TIMES DAILY 90 capsule 10 12/16/2021   HYDROcodone-acetaminophen (NORCO) 10-325 MG tablet Take 1 tablet by mouth every 4 (four) hours as needed for moderate pain. 30 tablet 0 Past Week   insulin lispro (HUMALOG) 100 UNIT/ML KwikPen INJECT 15-20 UNITS SUBCUTANEOUSLY THREE TIMES A DAY WITH MEALS (Patient taking differently: 15-20 Units 3 (three) times daily. Sliding scale) 15 mL 10 12/16/2021   insulin NPH-regular Human (HUMULIN 70/30) (70-30) 100 UNIT/ML injection Inject 60 Units into the skin 2 (two) times daily with a meal. 30 mL 2 12/16/2021   isosorbide mononitrate (IMDUR) 30 MG 24 hr tablet TAKE 1 TABLET BY MOUTH DAILY 30 tablet 10 12/16/2021   Menthol, Topical Analgesic, (BLUE-EMU MAXIMUM STRENGTH EX) Apply 1 application. topically daily as needed (pain).   unknown   metoprolol succinate (TOPROL-XL) 50 MG 24 hr tablet Take 1 tablet by mouth once daily 90 tablet 1 12/16/2021 at 0830   METOPROLOL TARTRATE PO Take 50 mg by mouth as needed (afib).   12/17/2021 at 01050   nitroGLYCERIN (NITROSTAT) 0.4 MG SL tablet DISSOLVE ONE TABLET UNDER THE TONGUE EVERY 5 MINUTES AS NEEDED FOR CHEST PAIN.  DO NOT EXCEED A TOTAL OF 3 DOSES IN 15 MINUTES Strength: 0.4 mg 25 tablet 0 Past Week   pantoprazole (PROTONIX) 40 MG tablet TAKE 1 TABLET BY MOUTH DAILY 30 tablet 10 12/16/2021   tirzepatide (MOUNJARO) 7.5 MG/0.5ML Pen Inject 7.5 mg into the skin once a week. 6 mL 1 12/12/2021   vitamin B-12 (CYANOCOBALAMIN) 1000 MCG tablet Take 1,000 mcg by mouth daily.   12/17/2021   aspirin EC 81 MG tablet Take 1 tablet (81 mg total) by mouth daily. Swallow whole. (Patient not taking: Reported on 12/17/2021) 150 tablet 2 Not Taking   Insulin Syringe-Needle U-100 28G X 1/2" 1 ML MISC Use to give insulin five times daily Dx E11.42 500 each 3    metolazone (ZAROXOLYN) 5 MG tablet Take 1  tablet (5 mg total) by mouth daily as needed (Swelling). (Patient not taking: Reported on 12/17/2021) 30 tablet 3 Completed Course   Assessment: 38 yom with a history of AF, NSTEMI, CAD w/ multiple stents, DM, HF, peripheral artery occlusive disease, recent femoropopliteal bypass, multiple skin ulcerations/necrotic areas. presenting with AMS. Enoxaparin per pharmacy  consult placed for AF.  Patient taking eliquis at home. Pt's wife believes last taken last night.  No bleeding events noted overnight. Hgb down slightly to 13.1, plt within normal limits. Will adjust lovenox dose based on new weight this am.   Goal of Therapy:  Monitor platelets by anticoagulation protocol: Yes   Plan:  Lovenox 120 mg bid Monitor for s/s of bleeding and renal function F/u plan to re-start eliquis  Erin Hearing PharmD., BCPS Clinical Pharmacist 12/18/2021 12:32 PM

## 2021-12-18 NOTE — Progress Notes (Signed)
PHARMACY - PHYSICIAN COMMUNICATION CRITICAL VALUE ALERT - BLOOD CULTURE IDENTIFICATION (BCID)  Assessment:  [name] [name] is an 71 y.o. male who presented [facility] on [facility]17/2021 with a chief complaint of AMS. PMH significant for diabetes, PAD with recent femoropopliteal bypass and multiple other vascular interventions. Most recent hospitalization in March 2023 for a wound infection. Patient found to have another foor wound on this admission.   Patient is growing 2/4 bottles (same set) with GPCs, identified as Streptococcus agalactiae on BCID with no resistance mechanisms detected.   Name of physician (or Provider) Contacted: Joette Catching, MD   Current antibiotics: Cefepime 2g IV Q8H and Vancomycin 1,500 mg IV Q24H   Changes to prescribed antibiotics recommended:  - STOP Vancomycin/Cefepime - START PCN G 4 million units IV Q4H  Recommendations accepted by provider  Results for orders placed or performed during the hospital encounter of 12/17/21  Blood Culture ID Panel (Reflexed) (Collected: 12/17/2021  1:43 PM)  Result Value Ref Range   Enterococcus faecalis NOT DETECTED NOT DETECTED   Enterococcus Faecium NOT DETECTED NOT DETECTED   Listeria monocytogenes NOT DETECTED NOT DETECTED   Staphylococcus species NOT DETECTED NOT DETECTED   Staphylococcus aureus (BCID) NOT DETECTED NOT DETECTED   Staphylococcus epidermidis NOT DETECTED NOT DETECTED   Staphylococcus lugdunensis NOT DETECTED NOT DETECTED   Streptococcus species DETECTED (A) NOT DETECTED   Streptococcus agalactiae DETECTED (A) NOT DETECTED   Streptococcus pneumoniae NOT DETECTED NOT DETECTED   Streptococcus pyogenes NOT DETECTED NOT DETECTED   A.calcoaceticus-baumannii NOT DETECTED NOT DETECTED   Bacteroides fragilis NOT DETECTED NOT DETECTED   Enterobacterales NOT DETECTED NOT DETECTED   Enterobacter cloacae complex NOT DETECTED NOT DETECTED   Escherichia coli NOT DETECTED NOT DETECTED   Klebsiella aerogenes NOT  DETECTED NOT DETECTED   Klebsiella oxytoca NOT DETECTED NOT DETECTED   Klebsiella pneumoniae NOT DETECTED NOT DETECTED   Proteus species NOT DETECTED NOT DETECTED   Salmonella species NOT DETECTED NOT DETECTED   Serratia marcescens NOT DETECTED NOT DETECTED   Haemophilus influenzae NOT DETECTED NOT DETECTED   Neisseria meningitidis NOT DETECTED NOT DETECTED   Pseudomonas aeruginosa NOT DETECTED NOT DETECTED   Stenotrophomonas maltophilia NOT DETECTED NOT DETECTED   Candida albicans NOT DETECTED NOT DETECTED   Candida auris NOT DETECTED NOT DETECTED   Candida glabrata NOT DETECTED NOT DETECTED   Candida krusei NOT DETECTED NOT DETECTED   Candida parapsilosis NOT DETECTED NOT DETECTED   Candida tropicalis NOT DETECTED NOT DETECTED   Cryptococcus neoformans/gattii NOT DETECTED NOT DETECTED    Adria Dill, PharmD PGY-2 Infectious Diseases Resident  12/18/2021 12:11 PM

## 2021-12-18 NOTE — Progress Notes (Signed)
[name] [name]  GEX:[id number] DOB: 06-25-1950 DOA: 12/17/2021 PCP: [name], FNP    Brief Narrative:  71 year old with a history of PAF, CAD status post multiple stents, DM2, chronic systolic CHF, PAD status post multiple vascular interventions, and multiple skin ulcerations and necrotic areas who was brought to the ER by his wife with altered mental status after he awoke feeling unwell with chills and rigors at home.  EMS found him to be in A-fib with RVR.  In the ER he had a temperature of 102.  CTa chest abdomen and pelvis revealed no evidence of infection.  Multiple toes on his left foot appeared gangrenous.  Consultants:  Vascular Surgery  Goals of Care:  Code Status: Full Code   DVT prophylaxis: Lovenox  Interim Hx: Much improved at the time of my visit.  Wife reports that his mental status is back to baseline.  He is alert and oriented.  He reports feeling poorly in general but cannot be more specific.  He denies focal pain.  He feels very weak.  He has a low-grade consistent nausea but no vomiting.  Assessment & Plan:  Sepsis POA due to multiple infected/gangrenous toes left foot Sepsis present on admission as evidenced by fever, tachycardia, and leukocytosis in setting of clear bacterial infection of toes -continue broad empiric antibiotic - Vascular Surgery following foot/toes  Gram-positive cocci bacteremia Due to above -continue antibiotic coverage and follow cultures  PAD status post multiple prior interventions Vascular surgery following  Chronic PAF Chronically on Eliquis -heart rate reasonably controlled at present w/ volume expansion  Chronic systolic CHF EF 01-02% via TTE March 2023 with focal regional WMA -no evidence of volume overload at present  CAD Status post cardiac cath March 2023 noting multivessel disease with recommendation to continue medical management  DM2 with nephropathy CBG elevated above goal -adjust treatment and follow  CKD stage  IIIa Baseline creatinine 1.5  Obesity - Body mass index is 33.56 kg/m.  HTN Blood pressure stable at present  Family Communication: Spoke with wife at bedside at length Disposition: From home -anticipate eventual discharge home   Objective: Blood pressure (!) 142/58, pulse 99, temperature 97.6 F (36.4 C), temperature source Oral, resp. rate 18, height '6\' 3"'$  (1.905 m), weight 121.8 kg, SpO2 96 %.  Intake/Output Summary (Last 24 hours) at 12/18/2021 1016 Last data filed at 12/18/2021 7253 Gross per 24 hour  Intake 3540.01 ml  Output 1175 ml  Net 2365.01 ml   Filed Weights   12/17/21 1320 12/17/21 2136  Weight: 133.8 kg 121.8 kg    Examination: General: No acute respiratory distress Lungs: Clear to auscultation bilaterally -no wheezing Cardiovascular: Regular rate without murmur or rub Abdomen: Nontender, nondistended, soft, bowel sounds positive, no rebound, no ascites, no appreciable mass Extremities: Multiple mummified/dry appearing toes on left foot -no significant forefoot erythema  CBC: Recent Labs  Lab 12/17/21 1343 12/18/21 0305  WBC 17.7* 19.0*  NEUTROABS 16.2*  --   HGB 14.4 13.1  HCT 48.7 44.0  MCV 78.9* 78.2*  PLT 373 664   Basic Metabolic Panel: Recent Labs  Lab 12/17/21 1343 12/18/21 0305  NA 132* 132*  K 4.1 4.4  CL 94* 94*  CO2 24 25  GLUCOSE 257* 217*  BUN 26* 26*  CREATININE 1.62* 1.73*  CALCIUM 10.1 9.6   GFR: Estimated Creatinine Clearance: 55.1 mL/min (A) (by C-G formula based on SCr of 1.73 mg/dL (H)).  Liver Function Tests: Recent Labs  Lab 12/17/21 1343  12/18/21 0305  AST 24 51*  ALT 16 19  ALKPHOS 64 45  BILITOT 1.8* 2.1*  PROT 9.1* 8.2*  ALBUMIN 4.3 3.5    Coagulation Profile: Recent Labs  Lab 12/17/21 1533 12/17/21 1922  INR 1.5* 1.5*    HbA1C: HB A1C (BAYER DCA - WAIVED)  Date/Time Value Ref Range Status  11/13/2021 09:10 AM 7.5 (H) 4.8 - 5.6 % Final    Comment:             Prediabetes: 5.7 - 6.4           Diabetes: >6.4          Glycemic control for adults with diabetes: <7.0   08/11/2021 10:09 AM 7.5 (H) 4.8 - 5.6 % Final    Comment:             Prediabetes: 5.7 - 6.4          Diabetes: >6.4          Glycemic control for adults with diabetes: <7.0     CBG: Recent Labs  Lab 12/17/21 1923 12/17/21 2140 12/17/21 2336 12/18/21 0510 12/18/21 0811  GLUCAP 214* 242* 255* 209* 227*    Scheduled Meds:  enoxaparin (LOVENOX) injection  1 mg/kg Subcutaneous Q12H   insulin aspart  0-9 Units Subcutaneous Q4H   metoprolol tartrate  2.5 mg Intravenous Q6H   Continuous Infusions:  ceFEPime (MAXIPIME) IV 2 g (12/18/21 0906)   lactated ringers 75 mL/hr at 12/18/21 0904   vancomycin       LOS: 1 day   Cherene Altes, MD [facility] Office  [phone] Pager - Text Page per Shea Evans  If 7PM-7AM, please contact night-coverage per Amion 12/18/2021, 10:16 AM

## 2021-12-18 NOTE — Progress Notes (Signed)
OT Cancellation Note  Patient Details Name: [name] [name] MRN: [id number] DOB: 24-Mar-1951   Cancelled Treatment:    Reason Eval/Treat Not Completed: Patient declined, no reason specified.  Will continue efforts as appropriate.    Tanja Gift D Lyndee Herbst 12/18/2021, 2:53 PM 12/18/2021  RP, OTR/L  Acute Rehabilitation Services  Office:  [phone]

## 2021-12-18 NOTE — Consult Note (Addendum)
Hospital Consult    Reason for Consult:  Gangrene of toes Requesting Physician:  Joette Catching MRN #:  [id number]  History of Present Illness: [name] [name] is a 71 y.o. male with a past medical history of A-fib, NSTEMI with CAD with multiple stents, diabetes, diastolic heart failure, peripheral arterial disease status post multiple vascular interventions.  Patient was admitted to the hospital on 12/17/2021 with sepsis.  Patient was brought to the hospital due to altered mental status, chills, fever, tachycardia.  In the ED the patient was found to be febrile with a temperature of 102, elevated lactic acid, UA with no evidence of infection, CTA chest/abdomen/pelvis with no evidence of infection.  Patient was started on broad-spectrum antibiotics.  Today the patient reports that he feels better than yesterday.  He states that he does not think his toes are the problem.  He and his wife report that the gangrene of his toes bilaterally has gotten better over the past couple of months.  He denies any drainage from the toes.  He denies any pain in his legs or feet.  He is still dressing his toes at home with betadine.  He endorses a fever at home yesterday.  The patient has undergone the following procedures:  03/21/2019: Right superficial femoral artery stent (claudication) 04/18/2019: Failed angioplasty, left superficial femoral artery occlusion (claudication) 11/12/2020: Failed angioplasty, left superficial femoral artery (ulcer) 11/22/2020: Left femoral to above-knee popliteal artery bypass graft with vein, right superficial femoral artery stent (bilateral ulcers) 01/28/2021: Abdominal aortogram with bilateral runoff 08/15/2021: Left femoral to below-knee popliteal artery bypass graft with 6 mm PTFE 08/18/2021: Retrograde peroneal cannulation with right tibioperoneal trunk stent  The patient was last seen by our office on 12/01/2021 by Dr.[name] to follow the patient's gangrenous toes. At that time, the  patient's gangrene was making significant progress and the patient was to follow up in 3 months for studies.   The pt is on a statin for cholesterol management.  The pt is on a daily aspirin.   Other AC:  Plavix, Eliquis The pt is on metoprolol for hypertension.   The pt is diabetic.   Tobacco hx:  former  Past Medical History:  Diagnosis Date   Anxiety    Arthritis    Atrial fibrillation (HCC)    CAD (coronary artery disease)    a. 2010: DES to CTO of RCA. EF 55% b. 07/2016: cath showing total occlusion within previously placed RCA stent (collaterals present), severe stenosis along LCx and OM1 (treated with 2 overlapping DES). c. repeat cath in 01/2018 showing patent stents along LCx and OM with CTO of D2, CTO of distal LCx, and CTO of RCA with collaterals present overall unchanged since 2018 with medical management recom   Cellulitis and abscess rt groin    Complication of anesthesia    " I woke up during a colonoscopy "      Depression    Diabetes mellitus    Diastolic CHF ([place])    Disorders of iron metabolism    Dysrhythmia    Fibromyalgia    GERD (gastroesophageal reflux disease)    History of hiatal hernia    Hyperlipidemia    Hypertension    Low serum testosterone level    Medically noncompliant    Myocardial infarction [facility])    05-23-20   Pneumonia     Past Surgical History:  Procedure Laterality Date   ABDOMINAL AORTOGRAM W/LOWER EXTREMITY N/A 03/21/2019   Procedure: ABDOMINAL AORTOGRAM W/LOWER EXTREMITY;  Surgeon: [name], MD;  Location: [place] [facility];  Service: Cardiovascular;  Laterality: N/A;   ABDOMINAL AORTOGRAM W/LOWER EXTREMITY N/A 11/12/2020   Procedure: ABDOMINAL AORTOGRAM W/LOWER EXTREMITY;  Surgeon: [name], MD;  Location: [place] [facility];  Service: Cardiovascular;  Laterality: N/A;   ABDOMINAL AORTOGRAM W/LOWER EXTREMITY N/A 01/28/2021   Procedure: ABDOMINAL AORTOGRAM W/LOWER EXTREMITY;  Surgeon: [name], MD;  Location: [place] [facility];  Service: Cardiovascular;  Laterality: N/A;   ABDOMINAL AORTOGRAM W/LOWER EXTREMITY Bilateral 07/29/2021   Procedure: ABDOMINAL AORTOGRAM W/LOWER EXTREMITY;  Surgeon: [name], MD;  Location: [place] [facility];  Service: Cardiovascular;  Laterality: Bilateral;   ANGIOPLASTY N/A 08/15/2021   Procedure: ATTEMPTED RIGHT PERONEAL  ANGIOPLASTY, LEFT PERONEAL ANGIOPLASTY;  Surgeon: [name], MD;  [facility];  Service: Vascular;  Laterality: N/A;   BACK SURGERY  2015   ACDF by Dr. [name]   COLONOSCOPY N/A 10/01/2014   Dr. [name]: multiple tubular adenomas removed, colonic diverticulosis, redundant colon. next tcs advised for 09/2017. PATIENT NEEDS PROPOFOL FOR FAILED CONSCIOUS SEDATION   CORONARY STENT INTERVENTION N/A 07/30/2016   Procedure: Coronary Stent Intervention;  Surgeon: [name], MD;  Location: [place] [facility];  Service: Cardiovascular;  Laterality: N/A;   CORONARY STENT PLACEMENT  2000   By Dr. Olevia Perches   EP IMPLANTABLE DEVICE N/A 05/25/2016   Procedure: Loop Recorder Insertion;  Surgeon: [name], MD;  Location: [place] [facility];  Service: Cardiovascular;  Laterality: N/A;   ESOPHAGOGASTRODUODENOSCOPY     esophagus stretched remotely at [facility]   ESOPHAGOGASTRODUODENOSCOPY N/A 10/01/2014   Dr. [name]: patchy mottling/erythema and minimal polypoid appearance of gastric mucosa. bx with mild inlammation but no H.pylori   FEMORAL-POPLITEAL BYPASS GRAFT Left 11/22/2020   Procedure: LEFT FEMORAL-POPLITEAL BYPASS GRAFT;  Surgeon: [name], MD;  [facility];  Service: Vascular;  Laterality: Left;   FEMORAL-POPLITEAL BYPASS GRAFT Left 08/15/2021   Procedure: REDO LEFT FEMORAL-POPLITEAL BYPASS USING PROPATEN GRAFT;  Surgeon: [name], MD;  Location: [place];  Service: Vascular;  Laterality: Left;   HERNIA REPAIR  1517   umbilical   INSERTION OF ILIAC STENT Right 11/22/2020   Procedure: INSERTION OF ELUVIA STENT INTO RIGHT DISTAL SUPERFICIAL  FEMORAL ARTERY;  Surgeon: [name], MD;  Location: [place];  Service: Vascular;  Laterality: Right;   LEFT HEART CATH AND CORONARY ANGIOGRAPHY N/A 07/30/2016   Procedure: Left Heart Cath and Coronary Angiography;  Surgeon: [name], MD;  Location: [place] [facility];  Service: Cardiovascular;  Laterality: N/A;   LEFT HEART CATH AND CORONARY ANGIOGRAPHY N/A 01/19/2018   Procedure: LEFT HEART CATH AND CORONARY ANGIOGRAPHY;  Surgeon: [name], MD;  Location: [place] [facility];  Service: Cardiovascular;  Laterality: N/A;   LEFT HEART CATH AND CORONARY ANGIOGRAPHY N/A 05/24/2020   Procedure: LEFT HEART CATH AND CORONARY ANGIOGRAPHY;  Surgeon: Burnell Blanks, MD;  Location: [place] [facility];  Service: Cardiovascular;  Laterality: N/A;   LEFT HEART CATH AND CORONARY ANGIOGRAPHY N/A 08/06/2021   Procedure: LEFT HEART CATH AND CORONARY ANGIOGRAPHY;  Surgeon: Burnell Blanks, MD;  Location: [place] [facility];  Service: Cardiovascular;  Laterality: N/A;   LESION REMOVAL     Lip and hand    LOWER EXTREMITY ANGIOGRAM Right 11/22/2020   Procedure: RIGHT LEG ANGIOGRAM;  Surgeon: [name], MD;  Location: [place];  Service: Vascular;  Laterality: Right;  LOWER EXTREMITY ANGIOGRAM Right 08/15/2021   Procedure: RIGHT LOWER EXTREMITY ANGIOGRAM;  Surgeon: [name], MD;  [facility] OR;  Service: Vascular;  Laterality: Right;   LOWER EXTREMITY ANGIOGRAPHY N/A 04/18/2019   Procedure: LOWER EXTREMITY ANGIOGRAPHY;  Surgeon: [name], MD;  Location: [place] [facility];  Service: Cardiovascular;  Laterality: N/A;   LOWER EXTREMITY ANGIOGRAPHY N/A 08/18/2021   Procedure: Lower Extremity Angiography;  Surgeon: Waynetta Sandy, MD;  Location: [place] [facility];  Service: Cardiovascular;  Laterality: N/A;   NECK SURGERY     PERIPHERAL VASCULAR BALLOON ANGIOPLASTY  04/18/2019   Procedure: PERIPHERAL VASCULAR BALLOON ANGIOPLASTY;  Surgeon: [name], MD;   Location: [place] [facility];  Service: Cardiovascular;;   PERIPHERAL VASCULAR BALLOON ANGIOPLASTY Left 11/12/2020   Procedure: PERIPHERAL VASCULAR BALLOON ANGIOPLASTY;  Surgeon: [name], MD;  Location: [place] [facility];  Service: Cardiovascular;  Laterality: Left;  Failed PTA of superficial femoral artery.   PERIPHERAL VASCULAR BALLOON ANGIOPLASTY Right 08/18/2021   Procedure: PERIPHERAL VASCULAR BALLOON ANGIOPLASTY;  Surgeon: Waynetta Sandy, MD;  Location: [place] [facility];  Service: Cardiovascular;  Laterality: Right;  peroneal   PERIPHERAL VASCULAR INTERVENTION Right 03/21/2019   Procedure: PERIPHERAL VASCULAR INTERVENTION;  Surgeon: [name], MD;  Location: [place] [facility];  Service: Cardiovascular;  Laterality: Right;  superficial femoral   PERIPHERAL VASCULAR INTERVENTION Right 08/18/2021   Procedure: PERIPHERAL VASCULAR INTERVENTION;  Surgeon: Waynetta Sandy, MD;  Location: [place] [facility];  Service: Cardiovascular;  Laterality: Right;  tibial peroneal trunk    Allergies  Allergen Reactions   Shellfish Allergy Anaphylaxis and Other (See Comments)    Tongue swelling, hives    Sulfa Antibiotics Anaphylaxis and Rash    Tongue swelling, hives   Ace Inhibitors Other (See Comments) and Cough    CKD, renal failure    Escitalopram Other (See Comments)    Buzzing in ears,headache, felt like a zombie    Evolocumab Other (See Comments)    Myalgias, flu like sx    Fenofibrate Other (See Comments)    Body aches - pt currently taking isnt sure if its causing any pain    Invokana [Canagliflozin] Other (See Comments)    Syncope / dehydration   Lisinopril Cough   Metformin And Related Itching   Pravastatin Sodium Other (See Comments)    myalgias   Crestor [Rosuvastatin] Other (See Comments)    Myalgias    Horse-Derived Products Rash    horse serum   Lexapro [Escitalopram Oxalate] Other (See Comments)    Buzzing in ears,headache, felt like a  zombie   Lipitor [Atorvastatin] Other (See Comments)    myalgias   Livalo [Pitavastatin] Other (See Comments)    Myalgias    Milk (Cow) Nausea Only    Ties stomach in knots     Tape Rash    Prior to Admission medications   Medication Sig Start Date End Date Taking? Authorizing Provider  albuterol (VENTOLIN HFA) 108 (90 Base) MCG/ACT inhaler Inhale 2 puffs into the lungs every 6 (six) hours as needed for wheezing or shortness of breath. 11/04/21  Yes Gottschalk, Ashly M, DO  Chlorphen-Phenyleph-ASA (ALKA-SELTZER PLUS COLD PO) Take 2 tablets by mouth daily.   Yes [provider]  clopidogrel (PLAVIX) 75 MG tablet TAKE 1 TABLET BY MOUTH DAILY 11/06/21  Yes Angelia Mould, MD  dapagliflozin propanediol (FARXIGA) 5 MG TABS tablet Take 1 tablet (5 mg total) by mouth daily  before breakfast. Patient taking differently: Take 10 mg by mouth daily before breakfast. 04/29/21  Yes Hawks, Christy A, FNP  DULoxetine (CYMBALTA) 30 MG capsule TAKE 1 CAPSULE BY MOUTH DAILY 09/19/21  Yes Hawks, Christy A, FNP  ELIQUIS 5 MG TABS tablet TAKE 1 TABLET BY MOUTH TWO TIMES DAILY 10/10/21  Yes Hawks, Christy A, FNP  ezetimibe (ZETIA) 10 MG tablet Take 1 tablet (10 mg total) by mouth daily. 04/28/21  Yes Josue Hector, MD  fenofibrate 160 MG tablet Take 1 tablet (160 mg total) by mouth daily. 04/29/21  Yes Hawks, Christy A, FNP  furosemide (LASIX) 20 MG tablet Take 20 mg by mouth daily.   Yes [provider]  gabapentin (NEURONTIN) 300 MG capsule TAKE 1 CAPSULE BY MOUTH 3 TIMES DAILY 12/03/21  Yes Hawks, Christy A, FNP  HYDROcodone-acetaminophen (NORCO) 10-325 MG tablet Take 1 tablet by mouth every 4 (four) hours as needed for moderate pain. 08/20/21  Yes Dagoberto Ligas, PA-C  insulin lispro (HUMALOG) 100 UNIT/ML KwikPen INJECT 15-20 UNITS SUBCUTANEOUSLY THREE TIMES A DAY WITH MEALS Patient taking differently: 15-20 Units 3 (three) times daily. Sliding scale 12/03/21  Yes Hawks, Christy A, FNP   insulin NPH-regular Human (HUMULIN 70/30) (70-30) 100 UNIT/ML injection Inject 60 Units into the skin 2 (two) times daily with a meal. 11/10/21  Yes Hawks, Christy A, FNP  isosorbide mononitrate (IMDUR) 30 MG 24 hr tablet TAKE 1 TABLET BY MOUTH DAILY 12/03/21  Yes Hawks, Christy A, FNP  Menthol, Topical Analgesic, (BLUE-EMU MAXIMUM STRENGTH EX) Apply 1 application. topically daily as needed (pain).   Yes [provider]  metoprolol succinate (TOPROL-XL) 50 MG 24 hr tablet Take 1 tablet by mouth once daily 08/28/21  Yes Josue Hector, MD  METOPROLOL TARTRATE PO Take 50 mg by mouth as needed (afib).   Yes [provider]  nitroGLYCERIN (NITROSTAT) 0.4 MG SL tablet DISSOLVE ONE TABLET UNDER THE TONGUE EVERY 5 MINUTES AS NEEDED FOR CHEST PAIN.  DO NOT EXCEED A TOTAL OF 3 DOSES IN 15 MINUTES Strength: 0.4 mg 04/28/21  Yes Josue Hector, MD  pantoprazole (PROTONIX) 40 MG tablet TAKE 1 TABLET BY MOUTH DAILY 12/03/21  Yes Hawks, Christy A, FNP  tirzepatide [facility]) 7.5 MG/0.5ML Pen Inject 7.5 mg into the skin once a week. 11/14/21  Yes Hawks, Christy A, FNP  vitamin B-12 (CYANOCOBALAMIN) 1000 MCG tablet Take 1,000 mcg by mouth daily.   Yes [provider]  aspirin EC 81 MG tablet Take 1 tablet (81 mg total) by mouth daily. Swallow whole. Patient not taking: Reported on 12/17/2021 08/20/21 11/13/22  Dagoberto Ligas, PA-C  Insulin Syringe-Needle U-100 28G X 1/2" 1 ML MISC Use to give insulin five times daily Dx E11.42 05/07/21   [name] A, FNP  metolazone (ZAROXOLYN) 5 MG tablet Take 1 tablet (5 mg total) by mouth daily as needed (Swelling). Patient not taking: Reported on 12/17/2021 09/01/21 11/30/21  Josue Hector, MD    Social History   Socioeconomic History   Marital status: Married    Spouse name: Belenda Cruise   Number of children: 2   Years of education: 12   Highest education level: 12th grade  Occupational History   Occupation: retired  Tobacco Use   Smoking  status: Former    Packs/day: 0.25    Years: 51.00    Total pack years: 12.75    Types: Cigarettes    Quit date: 08/14/2016    Years since quitting: 5.3  Smokeless tobacco: Never   Tobacco comments:    smokes  a pack a week  Vaping Use   Vaping Use: Never used  Substance and Sexual Activity   Alcohol use: No    Alcohol/week: 0.0 standard drinks of alcohol   Drug use: No   Sexual activity: Yes  Other Topics Concern   Not on file  Social History Narrative   Lives with his wife.  Retired.  Unable to afford expensive medicines.     He has 2 children from previous marriage - they live in [place]       Caffeine: 1 cup of 1/2 caff coffee in AM, drinks more if at a restaurant    Social Determinants of Health   Financial Resource Strain: Medium Risk (01/06/2021)   Overall Financial Resource Strain (CARDIA)    Difficulty of Paying Living Expenses: Somewhat hard  Food Insecurity: No Food Insecurity (01/06/2021)   Hunger Vital Sign    Worried About Running Out of Food in the Last Year: Never true    [place] in the Last Year: Never true  Transportation Needs: No Transportation Needs (01/06/2021)   PRAPARE - Hydrologist (Medical): No    Lack of Transportation (Non-Medical): No  Physical Activity: Insufficiently Active (01/06/2021)   Exercise Vital Sign    Days of Exercise per Week: 7 days    Minutes of Exercise per Session: 10 min  Stress: No Stress Concern Present (01/06/2021)   [place]    Feeling of Stress : Only a little  Social Connections: Moderately Isolated (01/06/2021)   Social Connection and Isolation Panel [NHANES]    Frequency of Communication with Friends and Family: More than three times a week    Frequency of Social Gatherings with Friends and Family: Twice a week    Attends Religious Services: Never    Marine scientist or Organizations: No    Attends Theatre manager Meetings: Never    Marital Status: Married  Human resources officer Violence: Not At Risk (01/06/2021)   Humiliation, Afraid, Rape, and Kick questionnaire    Fear of Current or Ex-Partner: No    Emotionally Abused: No    Physically Abused: No    Sexually Abused: No     Family History  Problem Relation Age of Onset   Diabetes Father    Valvular heart disease Father    Arthritis Father    Heart disease Father    Stroke Father    Alzheimer's disease Mother    Hyperlipidemia Mother    Hypertension Mother    Arthritis Mother    Lung cancer Mother    Stroke Mother    Headache Mother    Arthritis/Rheumatoid Sister    Diabetes Sister    Hypertension Sister    Hyperlipidemia Sister    Depression Sister    Dementia Maternal Aunt    Dementia Maternal Uncle    Heart disease Maternal Uncle    Stomach cancer Paternal Uncle    Colon cancer Neg Hx    Liver disease Neg Hx     ROS: '[x]'$  Positive   '[ ]'$  Negative   '[ ]'$  All sytems reviewed and are negative  Cardiac: '[]'$  chest pain/pressure '[]'$  palpitations '[]'$  SOB lying flat '[]'$  DOE  Vascular: '[]'$  pain in legs while walking '[]'$  pain in legs at rest '[]'$  pain in legs at night '[]'$  non-healing ulcers '[]'$  hx of DVT '[]'$   swelling in legs  Pulmonary: '[]'$  asthma/wheezing '[]'$  home O2  Neurologic: '[]'$  hx of CVA '[]'$  mini stroke   Hematologic: '[]'$  hx of cancer  Endocrine:   '[]'$  diabetes '[]'$  thyroid disease  GI '[]'$  GERD  GU: '[]'$  CKD/renal failure '[]'$  HD--'[]'$  M/W/F or '[]'$  T/T/S  Psychiatric: '[]'$  anxiety '[]'$  depression  Musculoskeletal: '[]'$  arthritis '[]'$  joint pain  Integumentary: '[]'$  rashes '[]'$  ulcers  Constitutional: '[]'$  fever  '[]'$  chills  Physical Examination  Vitals:   12/18/21 1152 12/18/21 1357  BP: 139/72 121/71  Pulse: 81 97  Resp: (!) 22 18  Temp: 98 F (36.7 C) (!) 97.4 F (36.3 C)  SpO2: 96% 97%   Body mass index is 33.56 kg/m.  General:  WDWN in NAD Gait: Not observed HENT: WNL, normocephalic Pulmonary: normal  non-labored breathing Cardiac: irregular, without murmur; without carotid bruit Abdomen:  soft, NT/ND; aortic pulse is not palpable Skin: without rashes; evidence of chronic venous disease with skin staining over bilateral shins Vascular Exam/Pulses: Bilateral femoral pulses 2+. Nonpalpable pedal pulses Extremities: without ischemic changes, with dry gangrene of all toe tips left foot and 5th toe tip right foot , without cellulitis; without open wounds. No purulent drainage of gangrene with digital manipulation. Multiple pressure ulcers of right ankle and left foot. Musculoskeletal: no muscle wasting or atrophy  Neurologic: A&O X 3; speech is fluent/normal Psychiatric:  The pt has Normal affect.  CBC    Component Value Date/Time   WBC 19.0 (H) 12/18/2021 0305   RBC 5.63 12/18/2021 0305   HGB 13.1 12/18/2021 0305   HGB 12.9 (L) 11/13/2021 0912   HCT 44.0 12/18/2021 0305   HCT 41.9 11/13/2021 0912   PLT 307 12/18/2021 0305   PLT 334 11/13/2021 0912   MCV 78.2 (L) 12/18/2021 0305   MCV 80 11/13/2021 0912   MCH 23.3 (L) 12/18/2021 0305   MCHC 29.8 (L) 12/18/2021 0305   RDW 15.9 (H) 12/18/2021 0305   RDW 14.2 11/13/2021 0912   LYMPHSABS 0.5 (L) 12/17/2021 1343   LYMPHSABS 1.4 11/13/2021 0912   MONOABS 0.7 12/17/2021 1343   EOSABS 0.0 12/17/2021 1343   EOSABS 0.2 11/13/2021 0912   BASOSABS 0.1 12/17/2021 1343   BASOSABS 0.1 11/13/2021 0912    BMET    Component Value Date/Time   NA 132 (L) 12/18/2021 0305   NA 137 11/13/2021 0912   K 4.4 12/18/2021 0305   CL 94 (L) 12/18/2021 0305   CO2 25 12/18/2021 0305   GLUCOSE 217 (H) 12/18/2021 0305   BUN 26 (H) 12/18/2021 0305   BUN 19 11/13/2021 0912   CREATININE 1.73 (H) 12/18/2021 0305   CALCIUM 9.6 12/18/2021 0305   GFRNONAA 42 (L) 12/18/2021 0305   GFRAA 54 (L) 06/20/2020 1529    COAGS: Lab Results  Component Value Date   INR 1.5 (H) 12/17/2021   INR 1.5 (H) 12/17/2021   INR 1.1 08/15/2021      ASSESSMENT/PLAN: This  is a 71 y.o. male being evaluated for gangrenous toes bilaterally in the setting of sepsis  -Patient has palpable femoral pulses bilaterally -Will order ABIs and duplex study of bilateral legs to ensure patency of left femoral-popliteal bypass, right SFA stent, and right tibioperoneal trunk stent. -Will order plain films of bilateral feet to rule out osteomyelitis -Current unknown source of infection and cause of sepsis. Gangrene of the toes bilaterally is dry and is an unlikely source of sepsis. -Gangrene of all left toes and 5th right toe is stable since  last office visit. Patient reports they are improving. The toes are in the process of auto-amputation.   Vicente Serene, PA-C [facility] [phone]   VASCULAR STAFF ADDENDUM: I have independently interviewed and examined the patient. I agree with the above.  5yM very well known to our service who has undergone extensive lower extremity revascularization efforts. Thankfully, his toes seem to be improving very slowly. On my evaluation I see no clinical evidence of wet gangrene. Foot amputation would be associate with high risk of failure to heal, and thus limb loss. Would recommend local care only to the toes for now. Will recheck non-invasive studies and plain films of the feet. I do not think the toes are the source of the patient's sepsis.   Yevonne Aline. Stanford Breed, MD [facility] Phone Number: [phone] 12/18/2021 5:03 PM

## 2021-12-19 ENCOUNTER — Inpatient Hospital Stay (HOSPITAL_COMMUNITY): Payer: Medicare Other

## 2021-12-19 ENCOUNTER — Encounter (HOSPITAL_COMMUNITY): Payer: Medicare Other

## 2021-12-19 DIAGNOSIS — L89890 Pressure ulcer of other site, unstageable: Secondary | ICD-10-CM

## 2021-12-19 DIAGNOSIS — R6520 Severe sepsis without septic shock: Secondary | ICD-10-CM | POA: Diagnosis not present

## 2021-12-19 DIAGNOSIS — I96 Gangrene, not elsewhere classified: Secondary | ICD-10-CM

## 2021-12-19 DIAGNOSIS — S81802S Unspecified open wound, left lower leg, sequela: Secondary | ICD-10-CM | POA: Diagnosis not present

## 2021-12-19 DIAGNOSIS — A419 Sepsis, unspecified organism: Secondary | ICD-10-CM | POA: Diagnosis not present

## 2021-12-19 DIAGNOSIS — R7881 Bacteremia: Secondary | ICD-10-CM

## 2021-12-19 DIAGNOSIS — I739 Peripheral vascular disease, unspecified: Secondary | ICD-10-CM

## 2021-12-19 DIAGNOSIS — B951 Streptococcus, group B, as the cause of diseases classified elsewhere: Secondary | ICD-10-CM

## 2021-12-19 LAB — COMPREHENSIVE METABOLIC PANEL
ALT: 21 U/L (ref 0–44)
AST: 38 U/L (ref 15–41)
Albumin: 3 g/dL — ABNORMAL LOW (ref 3.5–5.0)
Alkaline Phosphatase: 41 U/L (ref 38–126)
Anion gap: 11 (ref 5–15)
BUN: 29 mg/dL — ABNORMAL HIGH (ref 8–23)
CO2: 23 mmol/L (ref 22–32)
Calcium: 9.1 mg/dL (ref 8.9–10.3)
Chloride: 94 mmol/L — ABNORMAL LOW (ref 98–111)
Creatinine, Ser: 1.58 mg/dL — ABNORMAL HIGH (ref 0.61–1.24)
GFR, Estimated: 46 mL/min — ABNORMAL LOW (ref >60)
Glucose, Bld: 218 mg/dL — ABNORMAL HIGH (ref 70–99)
Potassium: 4 mmol/L (ref 3.5–5.1)
Sodium: 128 mmol/L — ABNORMAL LOW (ref 135–145)
Total Bilirubin: 1.2 mg/dL (ref 0.3–1.2)
Total Protein: 7.1 g/dL (ref 6.5–8.1)

## 2021-12-19 LAB — ECHOCARDIOGRAM COMPLETE
Calc EF: 47.6 %
Height: 75 in
S' Lateral: 4.7 cm
Single Plane A2C EF: 51.2 %
Single Plane A4C EF: 45.5 %
Weight: 4296.32 [oz_av]

## 2021-12-19 LAB — CBC
HCT: 37.9 % — ABNORMAL LOW (ref 39.0–52.0)
Hemoglobin: 11.6 g/dL — ABNORMAL LOW (ref 13.0–17.0)
MCH: 23.5 pg — ABNORMAL LOW (ref 26.0–34.0)
MCHC: 30.6 g/dL (ref 30.0–36.0)
MCV: 76.7 fL — ABNORMAL LOW (ref 80.0–100.0)
Platelets: 234 10*3/uL (ref 150–400)
RBC: 4.94 MIL/uL (ref 4.22–5.81)
RDW: 15.9 % — ABNORMAL HIGH (ref 11.5–15.5)
WBC: 12.3 10*3/uL — ABNORMAL HIGH (ref 4.0–10.5)
nRBC: 0 % (ref 0.0–0.2)

## 2021-12-19 LAB — GLUCOSE, CAPILLARY
Glucose-Capillary: 234 mg/dL — ABNORMAL HIGH (ref 70–99)
Glucose-Capillary: 290 mg/dL — ABNORMAL HIGH (ref 70–99)
Glucose-Capillary: 243 mg/dL — ABNORMAL HIGH (ref 70–99)
Glucose-Capillary: 260 mg/dL — ABNORMAL HIGH (ref 70–99)

## 2021-12-19 LAB — MAGNESIUM: Magnesium: 2 mg/dL (ref 1.7–2.4)

## 2021-12-19 MED ORDER — PERFLUTREN LIPID MICROSPHERE
1.0000 mL | INTRAVENOUS | Status: AC | PRN
  Administered 2021-12-19: 2 mL via INTRAVENOUS

## 2021-12-19 MED ORDER — DEXTROSE 5 % IV SOLN
12.0000 10*6.[IU] | Freq: Two times a day (BID) | INTRAVENOUS | Status: DC
  Administered 2021-12-19 – 2021-12-23 (×8): 12 10*6.[IU] via INTRAVENOUS
  Filled 2021-12-19 (×4): qty 12
  Filled 2021-12-19 (×2): qty 5
  Filled 2021-12-19 (×4): qty 12

## 2021-12-19 MED ORDER — LORAZEPAM 2 MG/ML IJ SOLN
1.0000 mg | Freq: Once | INTRAMUSCULAR | Status: AC
  Administered 2021-12-20: 1 mg via INTRAVENOUS
  Filled 2021-12-19: qty 1

## 2021-12-19 NOTE — Progress Notes (Signed)
[name] [name]  ZHY:[id number] DOB: 01-May-1951 DOA: 12/17/2021 PCP: [name], FNP    Brief Narrative:  71 year old with a history of PAF, CAD status post multiple stents, DM2, chronic systolic CHF, PAD status post multiple vascular interventions, and multiple skin ulcerations and necrotic areas who was brought to the ER by his wife with altered mental status after he awoke feeling unwell with chills and rigors at home.  EMS found him to be in A-fib with RVR.  In the ER he had a temperature of 102.  CTa chest abdomen and pelvis revealed no evidence of infection.  Multiple toes on his left foot appeared gangrenous.  Consultants:  Vascular Surgery  Goals of Care:  Code Status: Full Code   DVT prophylaxis: Lovenox  Interim Hx: Afebrile.  Vital signs stable.  Heart rate controlled.  Saturation 97-100% on room air.  Resting comfortably in bed with no new complaints.  In good spirits.  Assessment & Plan:  Sepsis POA due to bacteremia Sepsis present on admission as evidenced by fever, tachycardia, and leukocytosis in setting of bacteremia   Group B strep bacteremia continue antibiotic coverage - ID consulted to assist in management   PAD status post multiple prior interventions Vascular surgery following - no apparent need to acute intervention at present - all wounds actually look improved as per VVS  Chronic PAF Chronically on Eliquis -heart rate reasonably controlled at present w/ volume expansion  Chronic systolic CHF EF 29-52% via TTE March 2023 with focal regional WMA -no evidence of volume overload at present  CAD Status post cardiac cath March 2023 noting multivessel disease with recommendation to continue medical management  DM2 with nephropathy CBG elevated above goal - adjust treatment again today and follow  CKD stage IIIa Baseline creatinine 1.5  Obesity - Body mass index is 33.56 kg/m.  HTN Blood pressure stable at present  Family Communication: Spoke with  wife at bedside at length Disposition: From home -anticipate eventual discharge home   Objective: Blood pressure 137/75, pulse 92, temperature 98.1 F (36.7 C), temperature source Oral, resp. rate 20, height '6\' 3"'$  (1.905 m), weight 121.8 kg, SpO2 100 %.  Intake/Output Summary (Last 24 hours) at 12/19/2021 0948 Last data filed at 12/19/2021 0901 Gross per 24 hour  Intake 1815.88 ml  Output 2700 ml  Net -884.12 ml    Filed Weights   12/17/21 1320 12/17/21 2136  Weight: 133.8 kg 121.8 kg    Examination: General: No acute respiratory distress Lungs: Clear to auscultation bilaterally w/o wheezing  Cardiovascular: Regular rate without murmur or rub Abdomen: Nontender, nondistended, soft, bowel sounds positive Extremities: Multiple mummified/dry appearing toes on left foot -no significant forefoot erythema - no wet gangrene   CBC: Recent Labs  Lab 12/17/21 1343 12/18/21 0305 12/19/21 0403  WBC 17.7* 19.0* 12.3*  NEUTROABS 16.2*  --   --   HGB 14.4 13.1 11.6*  HCT 48.7 44.0 37.9*  MCV 78.9* 78.2* 76.7*  PLT 373 307 841    Basic Metabolic Panel: Recent Labs  Lab 12/17/21 1343 12/18/21 0305 12/19/21 0403  NA 132* 132* 128*  K 4.1 4.4 4.0  CL 94* 94* 94*  CO2 '24 25 23  '$ GLUCOSE 257* 217* 218*  BUN 26* 26* 29*  CREATININE 1.62* 1.73* 1.58*  CALCIUM 10.1 9.6 9.1  MG  --   --  2.0    GFR: Estimated Creatinine Clearance: 60.3 mL/min (A) (by C-G formula based on SCr of 1.58 mg/dL (H)).  Liver Function Tests: Recent Labs  Lab 12/17/21 1343 12/18/21 0305 12/19/21 0403  AST 24 51* 38  ALT '16 19 21  '$ ALKPHOS 64 45 41  BILITOT 1.8* 2.1* 1.2  PROT 9.1* 8.2* 7.1  ALBUMIN 4.3 3.5 3.0*     Coagulation Profile: Recent Labs  Lab 12/17/21 1533 12/17/21 1922  INR 1.5* 1.5*     HbA1C: HB A1C (BAYER DCA - WAIVED)  Date/Time Value Ref Range Status  11/13/2021 09:10 AM 7.5 (H) 4.8 - 5.6 % Final    Comment:             Prediabetes: 5.7 - 6.4          Diabetes:  >6.4          Glycemic control for adults with diabetes: <7.0   08/11/2021 10:09 AM 7.5 (H) 4.8 - 5.6 % Final    Comment:             Prediabetes: 5.7 - 6.4          Diabetes: >6.4          Glycemic control for adults with diabetes: <7.0     CBG: Recent Labs  Lab 12/18/21 0811 12/18/21 1151 12/18/21 1730 12/18/21 2148 12/19/21 0800  GLUCAP 227* 196* 218* 234* 234*     Scheduled Meds:  clopidogrel  75 mg Oral Daily   DULoxetine  30 mg Oral Daily   enoxaparin (LOVENOX) injection  120 mg Subcutaneous Q12H   ezetimibe  10 mg Oral Daily   fenofibrate  160 mg Oral Daily   gabapentin  300 mg Oral TID   insulin aspart  0-20 Units Subcutaneous TID WC   insulin aspart  0-5 Units Subcutaneous QHS   insulin detemir  10 Units Subcutaneous BID   isosorbide mononitrate  30 mg Oral Daily   metoprolol succinate  50 mg Oral Daily   pantoprazole  40 mg Oral Daily   Continuous Infusions:  sodium chloride 10 mL/hr at 12/19/21 0352   lactated ringers 50 mL/hr at 12/19/21 0352   penicillin G potassium 12 Million Units in dextrose 5 % 500 mL continuous infusion       LOS: 2 days   Cherene Altes, MD [facility] Office  [phone] Pager - Text Page per Shea Evans  If 7PM-7AM, please contact night-coverage per Amion 12/19/2021, 9:48 AM

## 2021-12-19 NOTE — Evaluation (Signed)
Physical Therapy Evaluation Patient Details Name: [name] [name] MRN: [id number] DOB: 1951-02-01 Today's Date: 12/19/2021  History of Present Illness  Pt adm 8/2 with AMS due to sepsis. Initially thought from LE wounds but per vascular wounds not source of infection. PMH:  PAF, HTN, HLD, DM, prior CVA, PAD, CAD with unstable angina, NSTEMI  Clinical Impression  Pt presents to PT with decr mobility due to illness and inactivity. Expect pt will make good progress back to baseline with mobility. Will follow acutely but doubt pt will need PT after DC.         Recommendations for follow up therapy are one component of a multi-disciplinary discharge planning process, led by the attending physician.  Recommendations may be updated based on patient status, additional functional criteria and insurance authorization.  Follow Up Recommendations No PT follow up      Assistance Recommended at Discharge PRN  Patient can return home with the following  Help with stairs or ramp for entrance    Equipment Recommendations None recommended by PT  Recommendations for Other Services       Functional Status Assessment Patient has had a recent decline in their functional status and demonstrates the ability to make significant improvements in function in a reasonable and predictable amount of time.     Precautions / Restrictions Precautions Precautions: Fall      Mobility  Bed Mobility Overal bed mobility: Needs Assistance Bed Mobility: Supine to Sit, Sit to Supine     Supine to sit: Supervision, HOB elevated Sit to supine: Supervision, HOB elevated   General bed mobility comments: Incr time    Transfers Overall transfer level: Needs assistance Equipment used: Rollator (4 wheels) Transfers: Sit to/from Stand Sit to Stand: Min guard           General transfer comment: Assist for safety    Ambulation/Gait Ambulation/Gait assistance: Min guard Gait Distance (Feet): 140 Feet Assistive  device: Rollator (4 wheels) Gait Pattern/deviations: Step-through pattern, Decreased stride length, Trunk flexed Gait velocity: decr Gait velocity interpretation: <1.31 ft/sec, indicative of household ambulator   General Gait Details: Assist for Armed forces logistics/support/administrative officer    Modified Rankin (Stroke Patients Only)       Balance Overall balance assessment: Needs assistance Sitting-balance support: Feet unsupported, No upper extremity supported Sitting balance-Leahy Scale: Good     Standing balance support: No upper extremity supported, During functional activity Standing balance-Leahy Scale: Fair                               Pertinent Vitals/Pain Pain Assessment Pain Assessment: Faces Faces Pain Scale: Hurts even more Pain Location: back Pain Descriptors / Indicators: Grimacing, Guarding Pain Intervention(s): Limited activity within patient's tolerance, Monitored during session    Home Living Family/patient expects to be discharged to:: Private residence Living Arrangements: Spouse/significant other Available Help at Discharge: Family;Available 24 hours/day Type of Home: House Home Access: Stairs to enter Entrance Stairs-Rails: Psychiatric nurse of Steps: 4   Home Layout: One level Home Equipment: Cane - single Barista (2 wheels);Wheelchair - manual;BSC/3in1;Grab bars - toilet;Shower seat - built in      Prior Function Prior Level of Function : Independent/Modified Independent             Mobility Comments: Using the cane more for inside, and the RW for outside.  Keeps  his wheelchair in the truck. ADLs Comments: Pt took showers and sponge bathed without assist from spouse     Hand Dominance   Dominant Hand: Right    Extremity/Trunk Assessment   Upper Extremity Assessment Upper Extremity Assessment: Defer to OT evaluation    Lower Extremity Assessment Lower Extremity  Assessment: Generalized weakness       Communication   Communication: No difficulties  Cognition Arousal/Alertness: Awake/alert Behavior During Therapy: WFL for tasks assessed/performed Overall Cognitive Status: Within Functional Limits for tasks assessed                                          General Comments General comments (skin integrity, edema, etc.): VSS    Exercises     Assessment/Plan    PT Assessment Patient needs continued PT services  PT Problem List Decreased strength;Decreased balance;Decreased mobility       PT Treatment Interventions DME instruction;Functional mobility training;Balance training;Patient/family education;Gait training;Therapeutic activities;Therapeutic exercise    PT Goals (Current goals can be found in the Care Plan section)  Acute Rehab PT Goals Patient Stated Goal: return home PT Goal Formulation: With patient Time For Goal Achievement: 01/02/22 Potential to Achieve Goals: Good    Frequency Min 3X/week     Co-evaluation               AM-PAC PT "6 Clicks" Mobility  Outcome Measure Help needed turning from your back to your side while in a flat bed without using bedrails?: A Little Help needed moving from lying on your back to sitting on the side of a flat bed without using bedrails?: A Little Help needed moving to and from a bed to a chair (including a wheelchair)?: A Little Help needed standing up from a chair using your arms (e.g., wheelchair or bedside chair)?: A Little Help needed to walk in hospital room?: A Little Help needed climbing 3-5 steps with a railing? : A Little 6 Click Score: 18    End of Session Equipment Utilized During Treatment: Gait belt Activity Tolerance: Patient tolerated treatment well Patient left: in bed;with call bell/phone within reach;with family/visitor present   PT Visit Diagnosis: Other abnormalities of gait and mobility (R26.89);Muscle weakness (generalized) (M62.81)     Time: 6389-3734 PT Time Calculation (min) (ACUTE ONLY): 26 min   Charges:   PT Evaluation $PT Eval Moderate Complexity: 1 Mod PT Treatments $Gait Training: 8-22 mins        [place] Office [place] 12/19/2021, 4:58 PM

## 2021-12-19 NOTE — Evaluation (Signed)
Occupational Therapy Evaluation Patient Details Name: [name] [name] MRN: [id number] DOB: 12/23/1950 Today's Date: 12/19/2021   History of Present Illness Pt adm 8/2 with AMS due to sepsis from LE wounds. PMH:  PAF, HTN, HLD, DM, prior CVA, PAD, CAD with unstable angina, NSTEMI   Clinical Impression   Pt currently min assist to min guard for functional transfers and LB selfcare sit to stand.  Feel he will benefit from acute care OT to address these deficits in order to return home with spouse who can provide 24 hr assist as needed.  No post acute OT needs recommended.        Recommendations for follow up therapy are one component of a multi-disciplinary discharge planning process, led by the attending physician.  Recommendations may be updated based on patient status, additional functional criteria and insurance authorization.   Follow Up Recommendations  No OT follow up    Assistance Recommended at Discharge PRN  Patient can return home with the following A little help with bathing/dressing/bathroom;Assistance with cooking/housework;Assist for transportation;A little help with walking and/or transfers    Functional Status Assessment  Patient has had a recent decline in their functional status and demonstrates the ability to make significant improvements in function in a reasonable and predictable amount of time.  Equipment Recommendations  None recommended by OT       Precautions / Restrictions Precautions Precautions: Fall Restrictions Weight Bearing Restrictions: No      Mobility Bed Mobility Overal bed mobility: Needs Assistance Bed Mobility: Supine to Sit     Supine to sit: Supervision          Transfers Overall transfer level: Needs assistance Equipment used: None Transfers: Sit to/from Stand, Bed to chair/wheelchair/BSC Sit to Stand: Min guard     Step pivot transfers: Min assist     General transfer comment: Pt ambulated to and from the bathroom using the  counter top, doorjams, and the foot of the bed to place a hand for balance.      Balance Overall balance assessment: Needs assistance Sitting-balance support: Feet unsupported, No upper extremity supported Sitting balance-Leahy Scale: Good     Standing balance support: No upper extremity supported, During functional activity Standing balance-Leahy Scale: Fair Standing balance comment: Pt able to maintain balance statically without support but needs at least one UE supported when ambulating.                           ADL either performed or assessed with clinical judgement   ADL Overall ADL's : (P) Needs assistance/impaired Eating/Feeding: Independent;Sitting   Grooming: Wash/dry hands;Wash/dry face;Min guard   Upper Body Bathing: Supervision/ safety;Sitting   Lower Body Bathing: Min guard;Sit to/from stand   Upper Body Dressing : Set up;Sitting   Lower Body Dressing: Min guard;Sit to/from stand   Toilet Transfer: Minimal assistance;Ambulation Toilet Transfer Details (indicate cue type and reason): no assistive device Toileting- Clothing Manipulation and Hygiene: Min guard;Sit to/from stand       Functional mobility during ADLs: Minimal assistance (no assistive device) General ADL Comments: HR increasing from 90 at rest up to 150 BPM with mobility and standing.     Vision Baseline Vision/History: 1 Wears glasses Ability to See in Adequate Light: 0 Adequate Patient Visual Report: No change from baseline Vision Assessment?: No apparent visual deficits     Perception Perception Perception: Within Functional Limits   Praxis Praxis Praxis: Intact    Pertinent Vitals/Pain Pain  Assessment Pain Assessment: 0-10 Pain Score: 10-Worst pain ever Pain Location: back Pain Descriptors / Indicators: Aching Pain Intervention(s): Monitored during session, Premedicated before session, Repositioned     Hand Dominance Right   Extremity/Trunk Assessment Upper  Extremity Assessment Upper Extremity Assessment: Overall WFL for tasks assessed   Lower Extremity Assessment Lower Extremity Assessment: Defer to PT evaluation   Cervical / Trunk Assessment Cervical / Trunk Assessment: Normal   Communication Communication Communication: No difficulties   Cognition Arousal/Alertness: Awake/alert Behavior During Therapy: WFL for tasks assessed/performed Overall Cognitive Status: Within Functional Limits for tasks assessed                                                  Home Living Family/patient expects to be discharged to:: Private residence Living Arrangements: Spouse/significant other Available Help at Discharge: Family;Available 24 hours/day Type of Home: House Home Access: Stairs to enter [employer] of Steps: 4 Entrance Stairs-Rails: Right;Left Home Layout: One level     Bathroom Shower/Tub: Occupational psychologist: Standard Bathroom Accessibility: Yes   Home Equipment: Cane - single Barista (2 wheels);Wheelchair - manual;BSC/3in1;Grab bars - toilet;Shower seat - built in          Prior Functioning/Environment Prior Level of Function : Needs assist             Mobility Comments: Using the cane more for inside, and the RW for outside.  Keeps his wheelchair in the truck. ADLs Comments: Pt took showers and sponge bathed without assist from spouse        OT Problem List: Impaired balance (sitting and/or standing);Pain;Decreased strength;Decreased activity tolerance      OT Treatment/Interventions:      OT Goals(Current goals can be found in the care plan section) Acute Rehab OT Goals Patient Stated Goal: Pt wants to go home as soon as possible. OT Goal Formulation: With patient/family Time For Goal Achievement: 01/02/22 Potential to Achieve Goals: Good  OT Frequency:         AM-PAC OT "6 Clicks" Daily Activity     Outcome Measure Help from another person eating  meals?: None Help from another person taking care of personal grooming?: A Little Help from another person toileting, which includes using toliet, bedpan, or urinal?: A Little Help from another person bathing (including washing, rinsing, drying)?: A Little Help from another person to put on and taking off regular upper body clothing?: None Help from another person to put on and taking off regular lower body clothing?: A Little 6 Click Score: 20   End of Session Equipment Utilized During Treatment: Gait belt Nurse Communication: Mobility status  Activity Tolerance: Patient tolerated treatment well Patient left: in bed;with call bell/phone within reach;with family/visitor present  OT Visit Diagnosis: Unsteadiness on feet (R26.81);Repeated falls (R29.6);Muscle weakness (generalized) (M62.81);Pain Pain - part of body:  (back)                Time: 2542-7062 OT Time Calculation (min): 70 min Charges:  OT General Charges $OT Visit: 1 Visit OT Evaluation $OT Eval Moderate Complexity: 1 Mod OT Treatments $Self Care/Home Management : 53-67 mins Joane Postel OTR/L 12/19/2021, 11:01 AM

## 2021-12-19 NOTE — Progress Notes (Signed)
Seen on morning rounds. Feet appear stable. XR unrevealing. No evidence of osteo or gas gangrene. GBS bacteremia noted. Discussed with Dr. Thereasa Solo. Will try to avoid any kind of foot amputation if at all possible. Non-invasive studies pending. Please call on call team for any questions over the weekend. Dr. [name] will be back Monday.  Yevonne Aline. Stanford Breed, MD [facility] Phone Number: [phone] 12/19/2021 11:14 AM
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
## 2021-12-19 NOTE — Progress Notes (Signed)
Lower extremity arterial bilateral and ABI w/ TBI study completed.   Please see CV Proc for preliminary results.   Darlin Coco, RDMS, RVT

## 2021-12-19 NOTE — Consult Note (Signed)
[facility]                                                                                        Patient Identification: Patient Name: [name] [name] MRN: [id number] Admit Date: 12/17/2021  1:06 PM Today's Date: 12/19/2021 Reason for consult: bacteremia Requesting provider: Joette Catching  Principal Problem:   Sepsis [facility]) Active Problems:   Hyperlipidemia associated with type 2 diabetes mellitus ([place])   Hypertension associated with diabetes ([place])   CAD S/P percutaneous coronary angioplasty   GERD (gastroesophageal reflux disease)   Morbid obesity ([place])   Leg wound, left   Coronary artery disease of native artery of native heart with stable angina pectoris ([place])   Pressure injury of skin   Antibiotics:  Vancomycin 8/2, Pen G 8/3-c Cefepime 8/2  Metronidazole 8/2  Lines/Hardware:  Assessment # Recurrent Group B streptococcus bacteremia- possible source could be bilateral foot pressure ulcers and dry gangerene but wounds improving per wife and vascular. R/o endocarditis  - Previous Group B strep bacteremia in 07/25/21, no repeat blood cx drawn for clearance,  treated with Vancomycin> Pen G> Amoxicillin for 10 days on discharge. TTE 3/14 mild calcification of the  aortic valve. There is mild thickening of the aortic valve. Aortic valve  - Loop recorder +  # PAD s/p multiple vascular intervention  # Chronic dry gangrene of left 1st, 2nd, 3rd and 4th toes and rt 5th toe with multiple pressure ulcers in the bilateral ankle and foot  - ABI 8/4 with evidence of bilateral vascular disease  - 8/4  Patent mid SFA stent ( rt ). Patent left femoral-popliteal bypass graft ( lt) - Vascular following   # Acute on chronic lower back pain - rates lower back pain as 15/10, has Upper Lumbar and lower thoracic tenderness. No C spine tenderness   Recommendations  Cotinue Pen G as is  Repeat 2 sets of  blood cx Fu TTE, will need TEE if TTE is negative to r/o endocarditis given recurrence of bacteremia with unclear source  I have ordered MRI T L spine wo contrast for reason stated above Monitor CBC and BMP Fu Vascular surgery recommendations  D/w Dr Dorothy Puffer   Dr Baxter Flattery covering this weekend. I am back Monday.  Rest of the management as per the primary team. Please call with questions or concerns.  Thank you for the consult  Rosiland Oz, MD Infectious Disease Physician [facility] [address] [place], [place] [address] Phone: [phone]  Fax: [phone]  __________________________________________________________________________________________________________ HPI and Hospital Course: 71 year old male with PMH of CAD s/p stents, PAD with multiple vascular intervention, most recently 4/23 stent  Stent of right tibioperoneal trunk and peroneal artery, atrial fibrillation on AC, anxiety/depression, DM, diastolic CHF, GERD, fibromyalgia, hyperlipidemia, hypertension, ACDF 2015, 07/2021 redo left femoral popliteal bypass graft presented to the ED on 8/2 as patient was lying in the floor with uncontrolled A-fib/AMS.  Given Cardizem in route by EMS, temperature 100.6, found to be cool and clammy, disoriented. Also had nasuea, but no abdominal pain, diarrhea. Denies cough, chest pain  and worsening SOB.  Complains of acute worsening of LBP. Has h/o chronic back pain, Denies neck pain ( Has h/o ACDF in the past).  Denies smoking, alcohol and IVDU.   At ED, febrile with Tmax 102.2, WBC 17, sepsis picture Labs remarkable for CR 1.62, TB 1.8, lactic acid 2.7 8/2 blood cx both sets Group B streptococcus  8/4 blood cx ordered   ROS: all sysems reviewed with pertinent positives and negatives as listed above   Past Medical History:  Diagnosis Date   Anxiety    Arthritis    Atrial fibrillation (HCC)    CAD (coronary artery disease)     a. 2010: DES to CTO of RCA. EF 55% b. 07/2016: cath showing total occlusion within previously placed RCA stent (collaterals present), severe stenosis along LCx and OM1 (treated with 2 overlapping DES). c. repeat cath in 01/2018 showing patent stents along LCx and OM with CTO of D2, CTO of distal LCx, and CTO of RCA with collaterals present overall unchanged since 2018 with medical management recom   Cellulitis and abscess rt groin    Complication of anesthesia    " I woke up during a colonoscopy "      Depression    Diabetes mellitus    Diastolic CHF ([place])    Disorders of iron metabolism    Dysrhythmia    Fibromyalgia    GERD (gastroesophageal reflux disease)    History of hiatal hernia    Hyperlipidemia    Hypertension    Low serum testosterone level    Medically noncompliant    Myocardial infarction ([place])    05-23-20   Pneumonia    Past Surgical History:  Procedure Laterality Date   ABDOMINAL AORTOGRAM W/LOWER EXTREMITY N/A 03/21/2019   Procedure: ABDOMINAL AORTOGRAM W/LOWER EXTREMITY;  Surgeon: [name], MD;  Location: [place] [facility];  Service: Cardiovascular;  Laterality: N/A;   ABDOMINAL AORTOGRAM W/LOWER EXTREMITY N/A 11/12/2020   Procedure: ABDOMINAL AORTOGRAM W/LOWER EXTREMITY;  Surgeon: [name], MD;  Location: [place] [facility];  Service: Cardiovascular;  Laterality: N/A;   ABDOMINAL AORTOGRAM W/LOWER EXTREMITY N/A 01/28/2021   Procedure: ABDOMINAL AORTOGRAM W/LOWER EXTREMITY;  Surgeon: [name], MD;  Location: [place] [facility];  Service: Cardiovascular;  Laterality: N/A;   ABDOMINAL AORTOGRAM W/LOWER EXTREMITY Bilateral 07/29/2021   Procedure: ABDOMINAL AORTOGRAM W/LOWER EXTREMITY;  Surgeon: [name], MD;  Location: [place] [facility];  Service: Cardiovascular;  Laterality: Bilateral;   ANGIOPLASTY N/A 08/15/2021   Procedure: ATTEMPTED RIGHT PERONEAL  ANGIOPLASTY, LEFT PERONEAL ANGIOPLASTY;  Surgeon: [name], MD;  [facility];   Service: Vascular;  Laterality: N/A;   BACK SURGERY  2015   ACDF by Dr. [name]   COLONOSCOPY N/A 10/01/2014   Dr. [name]: multiple tubular adenomas removed, colonic diverticulosis, redundant colon. next tcs advised for 09/2017. PATIENT NEEDS PROPOFOL FOR FAILED CONSCIOUS SEDATION   CORONARY STENT INTERVENTION N/A 07/30/2016   Procedure: Coronary Stent Intervention;  Surgeon: [name], MD;  Location: [place] [facility];  Service: Cardiovascular;  Laterality: N/A;   CORONARY STENT PLACEMENT  2000   By Dr. Olevia Perches   EP IMPLANTABLE DEVICE N/A 05/25/2016   Procedure: Loop Recorder Insertion;  Surgeon: [name], MD;  Location: [place] [facility];  Service: Cardiovascular;  Laterality: N/A;   ESOPHAGOGASTRODUODENOSCOPY     esophagus stretched remotely at [facility]   ESOPHAGOGASTRODUODENOSCOPY N/A 10/01/2014   Dr. [name]: patchy mottling/erythema and minimal polypoid appearance of  gastric mucosa. bx with mild inlammation but no H.pylori   FEMORAL-POPLITEAL BYPASS GRAFT Left 11/22/2020   Procedure: LEFT FEMORAL-POPLITEAL BYPASS GRAFT;  Surgeon: [name], MD;  [facility];  Service: Vascular;  Laterality: Left;   FEMORAL-POPLITEAL BYPASS GRAFT Left 08/15/2021   Procedure: REDO LEFT FEMORAL-POPLITEAL BYPASS USING PROPATEN GRAFT;  Surgeon: [name], MD;  Location: [facility];  Service: Vascular;  Laterality: Left;   HERNIA REPAIR  5809   umbilical   INSERTION OF ILIAC STENT Right 11/22/2020   Procedure: INSERTION OF ELUVIA STENT INTO RIGHT DISTAL SUPERFICIAL FEMORAL ARTERY;  Surgeon: [name], MD;  Location: [facility];  Service: Vascular;  Laterality: Right;   LEFT HEART CATH AND CORONARY ANGIOGRAPHY N/A 07/30/2016   Procedure: Left Heart Cath and Coronary Angiography;  Surgeon: [name], MD;  Location: [facility] [place] [facility];  Service: Cardiovascular;  Laterality: N/A;   LEFT HEART CATH AND CORONARY ANGIOGRAPHY N/A 01/19/2018   Procedure: LEFT HEART CATH AND CORONARY ANGIOGRAPHY;  Surgeon:  [name], MD;  Location: [facility] [place] [facility];  Service: Cardiovascular;  Laterality: N/A;   LEFT HEART CATH AND CORONARY ANGIOGRAPHY N/A 05/24/2020   Procedure: LEFT HEART CATH AND CORONARY ANGIOGRAPHY;  Surgeon: Burnell Blanks, MD;  Location: [place] [facility] [place] [facility];  Service: Cardiovascular;  Laterality: N/A;   LEFT HEART CATH AND CORONARY ANGIOGRAPHY N/A 08/06/2021   Procedure: LEFT HEART CATH AND CORONARY ANGIOGRAPHY;  Surgeon: Burnell Blanks, MD;  Location: [place] [facility];  Service: Cardiovascular;  Laterality: N/A;   LESION REMOVAL     Lip and hand    LOWER EXTREMITY ANGIOGRAM Right 11/22/2020   Procedure: RIGHT LEG ANGIOGRAM;  Surgeon: [name], MD;  [facility];  Service: Vascular;  Laterality: Right;   LOWER EXTREMITY ANGIOGRAM Right 08/15/2021   Procedure: RIGHT LOWER EXTREMITY ANGIOGRAM;  Surgeon: [name], MD;  [facility];  Service: Vascular;  Laterality: Right;   LOWER EXTREMITY ANGIOGRAPHY N/A 04/18/2019   Procedure: LOWER EXTREMITY ANGIOGRAPHY;  Surgeon: [name], MD;  Location: [place] [facility];  Service: Cardiovascular;  Laterality: N/A;   LOWER EXTREMITY ANGIOGRAPHY N/A 08/18/2021   Procedure: Lower Extremity Angiography;  Surgeon: Waynetta Sandy, MD;  Location: [place] [facility];  Service: Cardiovascular;  Laterality: N/A;   NECK SURGERY     PERIPHERAL VASCULAR BALLOON ANGIOPLASTY  04/18/2019   Procedure: PERIPHERAL VASCULAR BALLOON ANGIOPLASTY;  Surgeon: [name], MD;  Location: [place] [facility];  Service: Cardiovascular;;   PERIPHERAL VASCULAR BALLOON ANGIOPLASTY Left 11/12/2020   Procedure: PERIPHERAL VASCULAR BALLOON ANGIOPLASTY;  Surgeon: [name], MD;  Location: [facility] [facility];  Service: Cardiovascular;  Laterality: Left;  Failed PTA of superficial femoral artery.   PERIPHERAL VASCULAR BALLOON ANGIOPLASTY Right 08/18/2021   Procedure: PERIPHERAL VASCULAR BALLOON ANGIOPLASTY;  Surgeon: Waynetta Sandy, MD;  Location: [place] [facility];  Service: Cardiovascular;  Laterality: Right;  peroneal   PERIPHERAL VASCULAR INTERVENTION Right 03/21/2019   Procedure: PERIPHERAL VASCULAR INTERVENTION;  Surgeon: [name], MD;  Location: [facility] [place] [facility];  Service: Cardiovascular;  Laterality: Right;  superficial femoral   PERIPHERAL VASCULAR INTERVENTION Right 08/18/2021   Procedure: PERIPHERAL VASCULAR INTERVENTION;  Surgeon: Waynetta Sandy, MD;  Location: [place] [facility];  Service: Cardiovascular;  Laterality: Right;  tibial peroneal trunk     Scheduled Meds:  clopidogrel  75 mg Oral Daily   DULoxetine  30 mg Oral  Daily   enoxaparin (LOVENOX) injection  120 mg Subcutaneous Q12H   ezetimibe  10 mg Oral Daily   fenofibrate  160 mg Oral Daily   gabapentin  300 mg Oral TID   insulin aspart  0-20 Units Subcutaneous TID WC   insulin aspart  0-5 Units Subcutaneous QHS   insulin detemir  10 Units Subcutaneous BID   isosorbide mononitrate  30 mg Oral Daily   metoprolol succinate  50 mg Oral Daily   pantoprazole  40 mg Oral Daily   Continuous Infusions:  sodium chloride 10 mL/hr at 12/19/21 0352   lactated ringers 50 mL/hr at 12/19/21 0352   penicillin G potassium 12 Million Units in dextrose 5 % 500 mL continuous infusion     PRN Meds:.sodium chloride, acetaminophen **OR** [DISCONTINUED] acetaminophen, hydrALAZINE, HYDROcodone-acetaminophen, ondansetron **OR** ondansetron (ZOFRAN) IV  Allergies  Allergen Reactions   Shellfish Allergy Anaphylaxis and Other (See Comments)    Tongue swelling, hives    Sulfa Antibiotics Anaphylaxis and Rash    Tongue swelling, hives   Ace Inhibitors Other (See Comments) and Cough    CKD, renal failure    Escitalopram Other (See Comments)    Buzzing in ears,headache, felt like a zombie    Evolocumab Other (See Comments)    Myalgias, flu like sx    Fenofibrate Other (See Comments)    Body aches - pt currently taking isnt  sure if its causing any pain    Invokana [Canagliflozin] Other (See Comments)    Syncope / dehydration   Lisinopril Cough   Metformin And Related Itching   Pravastatin Sodium Other (See Comments)    myalgias   Crestor [Rosuvastatin] Other (See Comments)    Myalgias    Horse-Derived Products Rash    horse serum   Lexapro [Escitalopram Oxalate] Other (See Comments)    Buzzing in ears,headache, felt like a zombie   Lipitor [Atorvastatin] Other (See Comments)    myalgias   Livalo [Pitavastatin] Other (See Comments)    Myalgias    Milk ([place]) Nausea Only    Ties stomach in knots     Tape Rash   Social History   Socioeconomic History   Marital status: Married    Spouse name: Belenda Cruise   Number of children: 2   Years of education: 12   Highest education level: 12th grade  Occupational History   Occupation: retired  Tobacco Use   Smoking status: Former    Packs/day: 0.25    Years: 51.00    Total pack years: 12.75    Types: Cigarettes    Quit date: 08/14/2016    Years since quitting: 5.3   Smokeless tobacco: Never   Tobacco comments:    smokes  a pack a week  Vaping Use   Vaping Use: Never used  Substance and Sexual Activity   Alcohol use: No    Alcohol/week: 0.0 standard drinks of alcohol   Drug use: No   Sexual activity: Yes  Other Topics Concern   Not on file  Social History Narrative   Lives with his wife.  Retired.  Unable to afford expensive medicines.     He has 2 children from previous marriage - they live in [place]       Caffeine: 1 cup of 1/2 caff coffee in AM, drinks more if at a restaurant    Social Determinants of Health   Financial Resource Strain: Medium Risk (01/06/2021)   Overall Financial Resource Strain (CARDIA)    Difficulty of  Paying Living Expenses: Somewhat hard  Food Insecurity: No Food Insecurity (01/06/2021)   Hunger Vital Sign    Worried About Running Out of Food in the Last Year: Never true    Ran Out of Food in the Last Year:  Never true  Transportation Needs: No Transportation Needs (01/06/2021)   PRAPARE - Hydrologist (Medical): No    Lack of Transportation (Non-Medical): No  Physical Activity: Insufficiently Active (01/06/2021)   Exercise Vital Sign    Days of Exercise per Week: 7 days    Minutes of Exercise per Session: 10 min  Stress: No Stress Concern Present (01/06/2021)   [place]    Feeling of Stress : Only a little  Social Connections: Moderately Isolated (01/06/2021)   Social Connection and Isolation Panel [NHANES]    Frequency of Communication with Friends and Family: More than three times a week    Frequency of Social Gatherings with Friends and Family: Twice a week    Attends Religious Services: Never    Marine scientist or Organizations: No    Attends Archivist Meetings: Never    Marital Status: Married  Human resources officer Violence: Not At Risk (01/06/2021)   Humiliation, Afraid, Rape, and Kick questionnaire    Fear of Current or Ex-Partner: No    Emotionally Abused: No    Physically Abused: No    Sexually Abused: No   Family History  Problem Relation Age of Onset   Diabetes Father    Valvular heart disease Father    Arthritis Father    Heart disease Father    Stroke Father    Alzheimer's disease Mother    Hyperlipidemia Mother    Hypertension Mother    Arthritis Mother    Lung cancer Mother    Stroke Mother    Headache Mother    Arthritis/Rheumatoid Sister    Diabetes Sister    Hypertension Sister    Hyperlipidemia Sister    Depression Sister    Dementia Maternal Aunt    Dementia Maternal Uncle    Heart disease Maternal Uncle    Stomach cancer Paternal Uncle    Colon cancer Neg Hx    Liver disease Neg Hx      Vitals BP 137/75 (BP Location: Right Arm)   Pulse 92   Temp 98.1 F (36.7 C) (Oral)   Resp 20   Ht '6\' 3"'$  (1.905 m)   Wt 121.8 kg   SpO2 100%   BMI  33.56 kg/m    Physical Exam Constitutional:  Obese, lying in the bed, chronically ill appearing     Comments:   Cardiovascular:     Rate and Rhythm: Normal rate and Irregular rhythm.     Heart sounds:   Pulmonary:     Effort: Pulmonary effort is normal on room air    Comments: Normal breath sounds   Abdominal:     Palpations: Abdomen is soft.     Tenderness: non distended and non tender   Musculoskeletal:        General: No swelling or tenderness in peripheral joints. Tenderness in lower thoracic and upper lumbar spine. No c spine tenderness  Skin:    Comments:              Neurological:     General: Grossly non focal, awake, alert and oriented   Psychiatric:        Mood and Affect:  Mood normal.    Pertinent Microbiology Results for orders placed or performed during the hospital encounter of 12/17/21  Resp Panel by RT-PCR (Flu A&B, Covid) Anterior Nasal Swab     Status: None   Collection Time: 12/17/21  1:24 PM   Specimen: Anterior Nasal Swab  Result Value Ref Range Status   SARS Coronavirus 2 by RT PCR NEGATIVE NEGATIVE Final    Comment: (NOTE) SARS-CoV-2 target nucleic acids are NOT DETECTED.  The SARS-CoV-2 RNA is generally detectable in upper respiratory specimens during the acute phase of infection. The lowest concentration of SARS-CoV-2 viral copies this assay can detect is 138 copies/mL. A negative result does not preclude SARS-Cov-2 infection and should not be used as the sole basis for treatment or other patient management decisions. A negative result may occur with  improper specimen collection/handling, submission of specimen other than nasopharyngeal swab, presence of viral mutation(s) within the areas targeted by this assay, and inadequate number of viral copies(<138 copies/mL). A negative result must be combined with clinical observations, patient history, and epidemiological information. The expected result is Negative.  Fact Sheet for  Patients:  EntrepreneurPulse.com.au  Fact Sheet for Healthcare Providers:  IncredibleEmployment.be  This test is no t yet approved or cleared by the [place] FDA and  has been authorized for detection and/or diagnosis of SARS-CoV-2 by FDA under an Emergency Use Authorization (EUA). This EUA will remain  in effect (meaning this test can be used) for the duration of the COVID-19 declaration under Section 564(b)(1) of the Act, 21 U.S.C.section 360bbb-3(b)(1), unless the authorization is terminated  or revoked sooner.       Influenza A by PCR NEGATIVE NEGATIVE Final   Influenza B by PCR NEGATIVE NEGATIVE Final    Comment: (NOTE) The Xpert Xpress SARS-CoV-2/FLU/RSV plus assay is intended as an aid in the diagnosis of influenza from Nasopharyngeal swab specimens and should not be used as a sole basis for treatment. Nasal washings and aspirates are unacceptable for Xpert Xpress SARS-CoV-2/FLU/RSV testing.  Fact Sheet for Patients: EntrepreneurPulse.com.au  Fact Sheet for Healthcare Providers: IncredibleEmployment.be  This test is not yet approved or cleared by the [place] FDA and has been authorized for detection and/or diagnosis of SARS-CoV-2 by FDA under an Emergency Use Authorization (EUA). This EUA will remain in effect (meaning this test can be used) for the duration of the COVID-19 declaration under Section 564(b)(1) of the Act, 21 U.S.C. section 360bbb-3(b)(1), unless the authorization is terminated or revoked.  Performed at [facility], [address], [place], [place] [address]   Culture, blood (Routine x 2)     Status: Abnormal (Preliminary result)   Collection Time: 12/17/21  1:43 PM   Specimen: BLOOD RIGHT HAND  Result Value Ref Range Status   Specimen Description   Final    BLOOD RIGHT HAND Performed at [facility], [address] [place], [place] [address]    Special Requests    Final    BOTTLES DRAWN AEROBIC AND ANAEROBIC Blood Culture adequate volume Performed at [place] [place], [place] [address]    Culture  Setup Time   Final    GRAM POSITIVE COCCI BOTTLES DRAWN AEROBIC AND ANAEROBIC Gram Stain Report Called to,Read Back By and Verified With: KNIGHT,S'@[id number]'$  BY MATTHEWS, B 8.3.2023  Organism ID to follow CRITICAL RESULT CALLED TO, READ BACK BY AND VERIFIED WITH: PHARMD AUSTIN P [id number] 1130 MLM    Culture (A)  Final    GROUP B STREP(S.AGALACTIAE)ISOLATED SUSCEPTIBILITIES  TO FOLLOW Performed at [facility], [place] [address], [place], [place] [address]    Report Status PENDING  Incomplete  Culture, blood (Routine x 2)     Status: Abnormal (Preliminary result)   Collection Time: 12/17/21  1:43 PM   Specimen: BLOOD  Result Value Ref Range Status   Specimen Description   Final    BLOOD LEFT ANTECUBITAL Performed at [facility], [place] [address] [place], [place] [address]    Special Requests   Final    BOTTLES DRAWN AEROBIC AND ANAEROBIC Blood Culture adequate volume Performed at [facility], [address], [place], [place] [address]    Culture  Setup Time   Final    GRAM POSITIVE COCCI ANAEROBIC BOTTLE CRITICAL VALUE NOTED.  VALUE IS CONSISTENT WITH PREVIOUSLY REPORTED AND CALLED VALUE. Performed at [facility], [address], [place], [place] [address]    Culture STREPTOCOCCUS AGALACTIAE (A)  Final   Report Status PENDING  Incomplete  Blood Culture ID Panel (Reflexed)     Status: Abnormal   Collection Time: 12/17/21  1:43 PM  Result Value Ref Range Status   Enterococcus faecalis NOT DETECTED NOT DETECTED Final   Enterococcus Faecium NOT DETECTED NOT DETECTED Final   Listeria monocytogenes NOT DETECTED NOT DETECTED Final   Staphylococcus species NOT DETECTED NOT DETECTED Final   Staphylococcus aureus (BCID) NOT DETECTED NOT DETECTED Final   Staphylococcus epidermidis NOT DETECTED NOT DETECTED Final   Staphylococcus  lugdunensis NOT DETECTED NOT DETECTED Final   Streptococcus species DETECTED (A) NOT DETECTED Final    Comment: CRITICAL RESULT CALLED TO, READ BACK BY AND VERIFIED WITH: PHARMD AUSTIN P [id number] 1130 MLM    Streptococcus agalactiae DETECTED (A) NOT DETECTED Final    Comment: CRITICAL RESULT CALLED TO, READ BACK BY AND VERIFIED WITH: PHARMD AUSTIN P [id number] 1130 MLM    Streptococcus pneumoniae NOT DETECTED NOT DETECTED Final   Streptococcus pyogenes NOT DETECTED NOT DETECTED Final   A.calcoaceticus-baumannii NOT DETECTED NOT DETECTED Final   Bacteroides fragilis NOT DETECTED NOT DETECTED Final   Enterobacterales NOT DETECTED NOT DETECTED Final   Enterobacter cloacae complex NOT DETECTED NOT DETECTED Final   Escherichia coli NOT DETECTED NOT DETECTED Final   Klebsiella aerogenes NOT DETECTED NOT DETECTED Final   Klebsiella oxytoca NOT DETECTED NOT DETECTED Final   Klebsiella pneumoniae NOT DETECTED NOT DETECTED Final   Proteus species NOT DETECTED NOT DETECTED Final   Salmonella species NOT DETECTED NOT DETECTED Final   Serratia marcescens NOT DETECTED NOT DETECTED Final   Haemophilus influenzae NOT DETECTED NOT DETECTED Final   Neisseria meningitidis NOT DETECTED NOT DETECTED Final   Pseudomonas aeruginosa NOT DETECTED NOT DETECTED Final   Stenotrophomonas maltophilia NOT DETECTED NOT DETECTED Final   Candida albicans NOT DETECTED NOT DETECTED Final   Candida auris NOT DETECTED NOT DETECTED Final   Candida glabrata NOT DETECTED NOT DETECTED Final   Candida krusei NOT DETECTED NOT DETECTED Final   Candida parapsilosis NOT DETECTED NOT DETECTED Final   Candida tropicalis NOT DETECTED NOT DETECTED Final   Cryptococcus neoformans/gattii NOT DETECTED NOT DETECTED Final    Comment: Performed at [facility], [place]. [address] [place],  [address]  Urine Culture     Status: None (Preliminary result)   Collection Time: 12/17/21  3:23 PM   Specimen: In/Out Cath Urine  Result Value  Ref Range Status   Specimen Description   Final    IN/OUT CATH URINE Performed at  [facility], [address], [place], [place] [address]    Special Requests   Final    NONE Performed at [facility], [address] [place], [place] [address]    Culture   Final    CULTURE REINCUBATED FOR BETTER GROWTH Performed at [facility], [place] [address], [place], [place] [address]    Report Status PENDING  Incomplete   *Note: Due to a large number of results and/or encounters for the requested time period, some results have not been displayed. A complete set of results can be found in Results Review.   Pertinent Lab seen by me:    Latest Ref Rng & Units 12/19/2021    4:03 AM 12/18/2021    3:05 AM 12/17/2021    1:43 PM  CBC  WBC 4.0 - 10.5 K/uL 12.3  19.0  17.7   Hemoglobin 13.0 - 17.0 g/dL 11.6  13.1  14.4   Hematocrit 39.0 - 52.0 % 37.9  44.0  48.7   Platelets 150 - 400 K/uL 234  307  373       Latest Ref Rng & Units 12/19/2021    4:03 AM 12/18/2021    3:05 AM 12/17/2021    1:43 PM  CMP  Glucose 70 - 99 mg/dL 218  217  257   BUN 8 - 23 mg/dL '29  26  26   '$ Creatinine 0.61 - 1.24 mg/dL 1.58  1.73  1.62   Sodium 135 - 145 mmol/L 128  132  132   Potassium 3.5 - 5.1 mmol/L 4.0  4.4  4.1   Chloride 98 - 111 mmol/L 94  94  94   CO2 22 - 32 mmol/L '23  25  24   '$ Calcium 8.9 - 10.3 mg/dL 9.1  9.6  10.1   Total Protein 6.5 - 8.1 g/dL 7.1  8.2  9.1   Total Bilirubin 0.3 - 1.2 mg/dL 1.2  2.1  1.8   Alkaline Phos 38 - 126 U/L 41  45  64   AST 15 - 41 U/L 38  51  24   ALT 0 - 44 U/L '21  19  16      '$ Pertinent Imagings/Other Imagings Plain films and CT images have been personally visualized and interpreted; radiology reports have been reviewed. Decision making incorporated into the Impression / Recommendations.  TTE 07/29/21 IMPRESSIONS Left ventricular ejection fraction, by estimation, is 40 to 45%. The left ventricle has mildly decreased function. The left ventricle demonstrates regional wall  motion abnormalities (see scoring diagram/findings for description). The left ventricular internal cavity size was mildly dilated. There is mild concentric left ventricular hypertrophy. Left ventricular diastolic function could not be evaluated. 1.Right ventricular systolic function is mildly reduced. The right ventricular size is mildly enlarged. There is normal pulmonary artery systolic pressure. 2. 3. Left atrial size was mildly dilated. 4. Right atrial size was mildly dilated. 5. The pericardial effusion is posterior to the left ventricle. 6. The mitral valve is normal in structure. Mild to moderate mitral valve regurgitation. The aortic valve is tricuspid. There is mild calcification of the aortic valve. There is mild thickening of the aortic valve. Aortic valve regurgitation is not visualized. 7. The inferior vena cava is dilated in size with <50% respiratory variability, suggesting right atrial pressure of 15 mmHg. 8.Comparison(s): A prior study was performed on 11/24/2020. Prior images reviewed side by side. Changes from prior study are noted. The left ventricular function is worsened. The left ventricular  wall motion abnormalities are new.    DG Foot 2 Views Left  Result Date: 12/18/2021 CLINICAL DATA:  Pain and ulcers in bilateral feet. EXAM: LEFT FOOT - 2 VIEW COMPARISON:  Left foot radiographs 07/25/2021 FINDINGS: Moderate hallux valgus. Mild lateral greater than medial great toe peripheral degenerative osteophytes. Moderate great toe metatarsal head-sesamoid joint space narrowing and peripheral osteophytosis degenerative change. Tiny plantar calcaneal heel spur. Mild chronic enthesopathic change at the Achilles insertion on the calcaneus. Old healed fractures of the distal shafts of the second and third metatarsals are unchanged. Chronic enthesopathic change at the peroneus brevis tendon insertion of the base of fifth metatarsal. Mild vascular calcifications. No definite soft  tissue ulcer is identified. No cortical erosion. IMPRESSION: 1. Moderate hallux valgus with mild great toe metatarsophalangeal osteoarthritis. 2. No definite radiographic evidence of a soft tissue ulcer. 3. No cortical erosion to indicate radiographic evidence of acute osteomyelitis. Electronically Signed   By: Yvonne Kendall M.D.   On: 12/18/2021 17:13   DG Foot 2 Views Right  Result Date: 12/18/2021 CLINICAL DATA:  Pain.  Ulcers in bilateral feet. EXAM: RIGHT FOOT - 2 VIEW COMPARISON:  Right foot radiographs 07/25/2021 FINDINGS: Minimal lateral great toe metatarsophalangeal degenerative osteophytosis. Moderate hallux valgus. Mild plantar calcaneal heel spur. Moderate enthesopathic spurring at the Achilles insertion on the calcaneus. A bandage overlies the hindfoot. No definite radiographic evidence of soft tissue ulceration is seen. No cortical erosion is seen. IMPRESSION: No cortical erosion is seen to indicate radiographic evidence of acute osteomyelitis. Moderate hallux valgus. Moderate enthesopathic spurring at the Achilles insertion on the calcaneus. Electronically Signed   By: Yvonne Kendall M.D.   On: 12/18/2021 17:11   CT CHEST ABDOMEN PELVIS W CONTRAST  Result Date: 12/17/2021 CLINICAL DATA:  Sepsis. EXAM: CT CHEST, ABDOMEN, AND PELVIS WITH CONTRAST TECHNIQUE: Multidetector CT imaging of the chest, abdomen and pelvis was performed following the standard protocol during bolus administration of intravenous contrast. RADIATION DOSE REDUCTION: This exam was performed according to the departmental dose-optimization program which includes automated exposure control, adjustment of the mA and/or kV according to patient size and/or use of iterative reconstruction technique. CONTRAST:  110m OMNIPAQUE IOHEXOL 300 MG/ML  SOLN COMPARISON:  June 21, 2018. FINDINGS: CT CHEST FINDINGS Cardiovascular: Atherosclerosis of thoracic aorta is noted without aneurysm or dissection. Normal cardiac size. No pericardial  effusion. Extensive coronary artery calcifications are noted. Mediastinum/Nodes: No enlarged mediastinal, hilar, or axillary lymph nodes. Thyroid gland, trachea, and esophagus demonstrate no significant findings. Lungs/Pleura: No pneumothorax or pleural effusion is noted. Right lung is clear. Minimal mosaic pattern is noted in the left lung which may represent air trapping secondary to small airways disease or possibly mild multifocal inflammation. Musculoskeletal: No chest wall mass or suspicious bone lesions identified. CT ABDOMEN PELVIS FINDINGS Hepatobiliary: No focal liver abnormality is seen. No gallstones, gallbladder wall thickening, or biliary dilatation. Pancreas: Unremarkable. No pancreatic ductal dilatation or surrounding inflammatory changes. Spleen: Normal in size without focal abnormality. Adrenals/Urinary Tract: Adrenal glands are unremarkable. Kidneys are normal, without renal calculi, focal lesion, or hydronephrosis. Bladder is unremarkable. Stomach/Bowel: Stomach is within normal limits. Appendix appears normal. No evidence of bowel wall thickening, distention, or inflammatory changes. Vascular/Lymphatic: Aortic atherosclerosis. No enlarged abdominal or pelvic lymph nodes. Reproductive: Prostate is unremarkable. Other: Small fat containing right inguinal hernia is noted. No ascites is noted. Musculoskeletal: No acute or significant osseous findings. IMPRESSION: Minimal mosaic pattern is noted in the left lung which may represent air trapping secondary to  small airways disease or possibly mild multifocal inflammation. Small fat containing right inguinal hernia is noted. No other acute abnormality seen in the chest, abdomen or pelvis. Aortic Atherosclerosis (ICD10-I70.0). Electronically Signed   By: Marijo Conception M.D.   On: 12/17/2021 15:37   DG Chest Port 1 View  Result Date: 12/17/2021 CLINICAL DATA:  Uncontrolled atrial fibrillation.  Possible sepsis. EXAM: PORTABLE CHEST 1 VIEW COMPARISON:   08/16/2021 FINDINGS: Cardiomegaly, vascular congestion. Left basilar opacity. Right lung clear. No effusions or acute bony abnormality. IMPRESSION: Cardiomegaly, vascular congestion. Left basilar atelectasis or infiltrate. Electronically Signed   By: Rolm Baptise M.D.   On: 12/17/2021 14:43     I spent 100  minutes for this patient encounter including review of prior medical records/discussing diagnostics and treatment plan with the patient/family/coordinate care with primary/other specialits with greater than 50% of time in face to face encounter.   Electronically signed by:   Rosiland Oz, MD Infectious Disease Physician [facility] Pager: [phone]

## 2021-12-19 NOTE — Progress Notes (Signed)
Inpatient Diabetes Program Recommendations  AACE/ADA: New Consensus Statement on Inpatient Glycemic Control (2015)  Target Ranges:  Prepandial:   less than 140 mg/dL      Peak postprandial:   less than 180 mg/dL (1-2 hours)      Critically ill patients:  140 - 180 mg/dL   Lab Results  Component Value Date   GLUCAP 234 (H) 12/19/2021   HGBA1C 7.5 (H) 11/13/2021    Review of Glycemic Control  Latest Reference Range & Units 12/18/21 05:10 12/18/21 08:11 12/18/21 11:51 12/18/21 17:30 12/18/21 21:48 12/19/21 08:00  Glucose-Capillary 70 - 99 mg/dL 209 (H) 227 (H) 196 (H) 218 (H) 234 (H) 234 (H)   Diabetes history: DM 2 Outpatient Diabetes medications:  Humalog 15-20 units tid Humulin 70/30- 60 units bid Moujaro 7.5 mg weekly Farxiga 10 mg daily- Spoke with patient's wife and verified home meds  Current orders for Inpatient glycemic control:  Novolog 0-20 units tid with meals and HS Levemir 10 units bid  Inpatient Diabetes Program Recommendations:    Consider increasing Levemir to 20 units bid. Patient currently out of room to procedure.  Spoke to wife and she verified home meds.  A1C in June was 7.5%.  Will follow.   Thanks,  Adah Perl, RN, BC-ADM Inpatient Diabetes Coordinator Pager [phone]  (8a-5p)

## 2021-12-19 NOTE — Progress Notes (Signed)
PT Cancellation Note  Patient Details Name: [name] [name] MRN: [id number] DOB: 06-26-1950   Cancelled Treatment:    Reason Eval/Treat Not Completed: Patient at procedure or test/unavailable. Pt being taken downstairs for a test.   [name] [facility] 12/19/2021, 11:01 AM [place] Office [phone]

## 2021-12-20 ENCOUNTER — Inpatient Hospital Stay (HOSPITAL_COMMUNITY): Payer: Medicare Other

## 2021-12-20 ENCOUNTER — Other Ambulatory Visit: Payer: Self-pay

## 2021-12-20 ENCOUNTER — Encounter (HOSPITAL_COMMUNITY): Payer: Self-pay | Admitting: Internal Medicine

## 2021-12-20 DIAGNOSIS — A419 Sepsis, unspecified organism: Secondary | ICD-10-CM | POA: Diagnosis not present

## 2021-12-20 LAB — BLOOD CULTURE ID PANEL (REFLEXED) - BCID2

## 2021-12-20 LAB — URINE CULTURE: Culture: 3000 — AB

## 2021-12-20 LAB — CULTURE, BLOOD (ROUTINE X 2)
Special Requests: ADEQUATE
Special Requests: ADEQUATE

## 2021-12-20 LAB — GLUCOSE, CAPILLARY
Glucose-Capillary: 202 mg/dL — ABNORMAL HIGH (ref 70–99)
Glucose-Capillary: 218 mg/dL — ABNORMAL HIGH (ref 70–99)
Glucose-Capillary: 230 mg/dL — ABNORMAL HIGH (ref 70–99)
Glucose-Capillary: 269 mg/dL — ABNORMAL HIGH (ref 70–99)

## 2021-12-20 MED ORDER — MUSCLE RUB 10-15 % EX CREA
TOPICAL_CREAM | CUTANEOUS | Status: DC | PRN
Start: 2021-12-20 — End: 2021-12-24
  Filled 2021-12-20: qty 85

## 2021-12-20 MED ORDER — TRAMADOL HCL 50 MG PO TABS: 50.0000 mg | ORAL_TABLET | Freq: Four times a day (QID) | ORAL | Status: DC | PRN

## 2021-12-20 MED ORDER — OXYCODONE HCL 5 MG PO TABS
5.0000 mg | ORAL_TABLET | ORAL | Status: DC | PRN
  Administered 2021-12-20 – 2021-12-22 (×5): 10 mg via ORAL
  Administered 2021-12-23: 5 mg via ORAL
  Filled 2021-12-20 (×6): qty 2

## 2021-12-20 MED ORDER — INSULIN DETEMIR 100 UNIT/ML ~~LOC~~ SOLN
14.0000 [IU] | Freq: Two times a day (BID) | SUBCUTANEOUS | Status: DC
  Administered 2021-12-20 – 2021-12-21 (×2): 14 [IU] via SUBCUTANEOUS
  Filled 2021-12-20 (×3): qty 0.14

## 2021-12-20 MED ORDER — INSULIN DETEMIR 100 UNIT/ML ~~LOC~~ SOLN
4.0000 [IU] | Freq: Once | SUBCUTANEOUS | Status: AC
  Administered 2021-12-20: 4 [IU] via SUBCUTANEOUS
  Filled 2021-12-20: qty 0.04

## 2021-12-20 NOTE — Progress Notes (Signed)
PHARMACY - PHYSICIAN COMMUNICATION CRITICAL VALUE ALERT - BLOOD CULTURE IDENTIFICATION (BCID)  [name] [name] is an 71 y.o. male who presented to [place] on 12/17/2021  Assessment:  32 yom presenting with sepsis due to recurrent GBS bacteremia, continuing on PCN G continuous infusion. TTE negative. TEE planned 8/8. Now 1 of 3 BCx bottles from 8/4 growing coag negative staph with methicillin resistance (possible contaminant).  Name of physician (or Provider) Contacted: Alcario Drought, J  Current antibiotics: PCN G continuous infusion  Changes to prescribed antibiotics recommended:  None, likely contaminant  Results for orders placed or performed during the hospital encounter of 12/17/21  Blood Culture ID Panel (Reflexed) (Collected: 12/17/2021  1:43 PM)  Result Value Ref Range   Enterococcus faecalis NOT DETECTED NOT DETECTED   Enterococcus Faecium NOT DETECTED NOT DETECTED   Listeria monocytogenes NOT DETECTED NOT DETECTED   Staphylococcus species NOT DETECTED NOT DETECTED   Staphylococcus aureus (BCID) NOT DETECTED NOT DETECTED   Staphylococcus epidermidis NOT DETECTED NOT DETECTED   Staphylococcus lugdunensis NOT DETECTED NOT DETECTED   Streptococcus species DETECTED (A) NOT DETECTED   Streptococcus agalactiae DETECTED (A) NOT DETECTED   Streptococcus pneumoniae NOT DETECTED NOT DETECTED   Streptococcus pyogenes NOT DETECTED NOT DETECTED   A.calcoaceticus-baumannii NOT DETECTED NOT DETECTED   Bacteroides fragilis NOT DETECTED NOT DETECTED   Enterobacterales NOT DETECTED NOT DETECTED   Enterobacter cloacae complex NOT DETECTED NOT DETECTED   Escherichia coli NOT DETECTED NOT DETECTED   Klebsiella aerogenes NOT DETECTED NOT DETECTED   Klebsiella oxytoca NOT DETECTED NOT DETECTED   Klebsiella pneumoniae NOT DETECTED NOT DETECTED   Proteus species NOT DETECTED NOT DETECTED   Salmonella species NOT DETECTED NOT DETECTED   Serratia marcescens NOT DETECTED NOT DETECTED   Haemophilus  influenzae NOT DETECTED NOT DETECTED   Neisseria meningitidis NOT DETECTED NOT DETECTED   Pseudomonas aeruginosa NOT DETECTED NOT DETECTED   Stenotrophomonas maltophilia NOT DETECTED NOT DETECTED   Candida albicans NOT DETECTED NOT DETECTED   Candida auris NOT DETECTED NOT DETECTED   Candida glabrata NOT DETECTED NOT DETECTED   Candida krusei NOT DETECTED NOT DETECTED   Candida parapsilosis NOT DETECTED NOT DETECTED   Candida tropicalis NOT DETECTED NOT DETECTED   Cryptococcus neoformans/gattii NOT DETECTED NOT DETECTED    Arturo Morton, PharmD, BCPS Please check AMION for all [place] contact numbers Clinical Pharmacist 12/21/2021 12:05 AM

## 2021-12-20 NOTE — Progress Notes (Incomplete)
PHARMACY - PHYSICIAN COMMUNICATION CRITICAL VALUE ALERT - BLOOD CULTURE IDENTIFICATION (BCID)  [name] [name] is an 71 y.o. male who presented to [place] on 12/17/2021  Assessment:  76 yom presenting with sepsis due to recurrent GBS bacteremia, continuing on PCN G continuous infusion. TTE negative. TEE planned 8/8. Now 1 of 3 BCx bottles from 8/4 growing coag negative staph with methicillin resistance (possible contaminant).  Name of physician (or Provider) Contacted: Alcario Drought, J  Current antibiotics: PCN G continuous infusion  Changes to prescribed antibiotics recommended:  None, likely contaminant  Results for orders placed or performed during the hospital encounter of 12/17/21  Blood Culture ID Panel (Reflexed) (Collected: 12/17/2021  1:43 PM)  Result Value Ref Range   Enterococcus faecalis NOT DETECTED NOT DETECTED   Enterococcus Faecium NOT DETECTED NOT DETECTED   Listeria monocytogenes NOT DETECTED NOT DETECTED   Staphylococcus species NOT DETECTED NOT DETECTED   Staphylococcus aureus (BCID) NOT DETECTED NOT DETECTED   Staphylococcus epidermidis NOT DETECTED NOT DETECTED   Staphylococcus lugdunensis NOT DETECTED NOT DETECTED   Streptococcus species DETECTED (A) NOT DETECTED   Streptococcus agalactiae DETECTED (A) NOT DETECTED   Streptococcus pneumoniae NOT DETECTED NOT DETECTED   Streptococcus pyogenes NOT DETECTED NOT DETECTED   A.calcoaceticus-baumannii NOT DETECTED NOT DETECTED   Bacteroides fragilis NOT DETECTED NOT DETECTED   Enterobacterales NOT DETECTED NOT DETECTED   Enterobacter cloacae complex NOT DETECTED NOT DETECTED   Escherichia coli NOT DETECTED NOT DETECTED   Klebsiella aerogenes NOT DETECTED NOT DETECTED   Klebsiella oxytoca NOT DETECTED NOT DETECTED   Klebsiella pneumoniae NOT DETECTED NOT DETECTED   Proteus species NOT DETECTED NOT DETECTED   Salmonella species NOT DETECTED NOT DETECTED   Serratia marcescens NOT DETECTED NOT DETECTED   Haemophilus  influenzae NOT DETECTED NOT DETECTED   Neisseria meningitidis NOT DETECTED NOT DETECTED   Pseudomonas aeruginosa NOT DETECTED NOT DETECTED   Stenotrophomonas maltophilia NOT DETECTED NOT DETECTED   Candida albicans NOT DETECTED NOT DETECTED   Candida auris NOT DETECTED NOT DETECTED   Candida glabrata NOT DETECTED NOT DETECTED   Candida krusei NOT DETECTED NOT DETECTED   Candida parapsilosis NOT DETECTED NOT DETECTED   Candida tropicalis NOT DETECTED NOT DETECTED   Cryptococcus neoformans/gattii NOT DETECTED NOT DETECTED    [name] [name] 12/20/2021  11:51 PM
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
## 2021-12-21 ENCOUNTER — Other Ambulatory Visit: Payer: Self-pay | Admitting: Family

## 2021-12-21 DIAGNOSIS — A419 Sepsis, unspecified organism: Secondary | ICD-10-CM | POA: Diagnosis not present

## 2021-12-21 LAB — CBC
HCT: 40 % (ref 39.0–52.0)
Hemoglobin: 12.1 g/dL — ABNORMAL LOW (ref 13.0–17.0)
MCH: 23.4 pg — ABNORMAL LOW (ref 26.0–34.0)
MCHC: 30.3 g/dL (ref 30.0–36.0)
MCV: 77.4 fL — ABNORMAL LOW (ref 80.0–100.0)
Platelets: 255 10*3/uL (ref 150–400)
RBC: 5.17 MIL/uL (ref 4.22–5.81)
RDW: 16 % — ABNORMAL HIGH (ref 11.5–15.5)
WBC: 8.6 10*3/uL (ref 4.0–10.5)
nRBC: 0 % (ref 0.0–0.2)

## 2021-12-21 LAB — GLUCOSE, CAPILLARY
Glucose-Capillary: 239 mg/dL — ABNORMAL HIGH (ref 70–99)
Glucose-Capillary: 257 mg/dL — ABNORMAL HIGH (ref 70–99)
Glucose-Capillary: 224 mg/dL — ABNORMAL HIGH (ref 70–99)
Glucose-Capillary: 258 mg/dL — ABNORMAL HIGH (ref 70–99)

## 2021-12-21 MED ORDER — INSULIN DETEMIR 100 UNIT/ML ~~LOC~~ SOLN
10.0000 [IU] | Freq: Once | SUBCUTANEOUS | Status: AC
  Administered 2021-12-21: 10 [IU] via SUBCUTANEOUS
  Filled 2021-12-21: qty 0.1

## 2021-12-21 MED ORDER — INSULIN ASPART 100 UNIT/ML IJ SOLN
2.0000 [IU] | Freq: Three times a day (TID) | INTRAMUSCULAR | Status: DC
  Administered 2021-12-21 – 2021-12-23 (×5): 2 [IU] via SUBCUTANEOUS

## 2021-12-21 MED ORDER — INSULIN DETEMIR 100 UNIT/ML ~~LOC~~ SOLN
24.0000 [IU] | Freq: Two times a day (BID) | SUBCUTANEOUS | Status: DC
Start: 2021-12-21 — End: 2021-12-22
  Administered 2021-12-21: 24 [IU] via SUBCUTANEOUS
  Filled 2021-12-21 (×3): qty 0.24
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
## 2021-12-22 ENCOUNTER — Inpatient Hospital Stay (HOSPITAL_COMMUNITY): Payer: Medicare Other

## 2021-12-22 DIAGNOSIS — R7881 Bacteremia: Secondary | ICD-10-CM | POA: Diagnosis not present

## 2021-12-22 DIAGNOSIS — A419 Sepsis, unspecified organism: Secondary | ICD-10-CM | POA: Diagnosis not present

## 2021-12-22 LAB — CBC
HCT: 40.5 % (ref 39.0–52.0)
Hemoglobin: 11.9 g/dL — ABNORMAL LOW (ref 13.0–17.0)
MCH: 23 pg — ABNORMAL LOW (ref 26.0–34.0)
MCHC: 29.4 g/dL — ABNORMAL LOW (ref 30.0–36.0)
MCV: 78.3 fL — ABNORMAL LOW (ref 80.0–100.0)
Platelets: 310 10*3/uL (ref 150–400)
RBC: 5.17 MIL/uL (ref 4.22–5.81)
RDW: 16 % — ABNORMAL HIGH (ref 11.5–15.5)
WBC: 10 10*3/uL (ref 4.0–10.5)
nRBC: 0 % (ref 0.0–0.2)

## 2021-12-22 LAB — CULTURE, BLOOD (ROUTINE X 2)

## 2021-12-22 LAB — GLUCOSE, CAPILLARY
Glucose-Capillary: 203 mg/dL — ABNORMAL HIGH (ref 70–99)
Glucose-Capillary: 230 mg/dL — ABNORMAL HIGH (ref 70–99)
Glucose-Capillary: 237 mg/dL — ABNORMAL HIGH (ref 70–99)
Glucose-Capillary: 205 mg/dL — ABNORMAL HIGH (ref 70–99)

## 2021-12-22 MED ORDER — INSULIN DETEMIR 100 UNIT/ML ~~LOC~~ SOLN
30.0000 [IU] | Freq: Two times a day (BID) | SUBCUTANEOUS | Status: DC
  Administered 2021-12-22 – 2021-12-23 (×3): 30 [IU] via SUBCUTANEOUS
  Filled 2021-12-22 (×4): qty 0.3
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
## 2021-12-22 NOTE — Consult Note (Signed)
   [facility] CM Inpatient Consult   12/22/2021  [name] [name] 07-Jul-1950 [id number]  [facility] [[facility]]  Accountable Care Organization [ACO] [name]  Primary Care Provider:  [name], FNP, [place],  is an embedded provider with a Chronic Care Management team and program, and is listed for the transition of care follow up and appointments.  Patient was screened for Embedded practice service needs for chronic care management/Care Coordination needs. 2:07 Met with patient and wife [name] [name] at length to discuss [facility] follow up at the Embedded provider.  Patient describes great relationships with his providers.  He was generous and talkative as he states he is not feeling his best today but speaks of having a great attitude even in conditions of dis-ease and discomfort. A reminder card for PCP follow up with a 24 hour nurse advise line number on card.  Plan: A referral can be sent to the [facility] and made aware of TOC needs for post hospital needs.  Please contact for further questions,  Natividad Brood, RN BSN [facility]  [phone] business mobile phone Toll free office [phone]  Fax number: [phone] [email or web address]
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
## 2021-12-22 NOTE — Progress Notes (Signed)
[facility]  Subjective  - He is feeling better since his admission   Objective 135/80 87 (!) 97.5 F (36.4 C) (Oral) 16 98%  Intake/Output Summary (Last 24 hours) at 12/22/2021 0708 Last data filed at 12/22/2021 0401 Gross per 24 hour  Intake 814.54 ml  Output 3000 ml  Net -2185.46 ml   Lungs non labored breathing Motor intact B LE Toes dry without erythema or drainage   +----------+--------+-----+--------+-----------------------+--------+  RIGHT     PSV cm/sRatioStenosisWaveform               Comments  +----------+--------+-----+--------+-----------------------+--------+  CFA Prox  140                  biphasic                         +----------+--------+-----+--------+-----------------------+--------+  CFA Mid   122                  biphasic                         +----------+--------+-----+--------+-----------------------+--------+  CFA Distal101                  biphasic                         +----------+--------+-----+--------+-----------------------+--------+  DFA       145                  biphasic                         +----------+--------+-----+--------+-----------------------+--------+  SFA Prox  143                  biphasic                         +----------+--------+-----+--------+-----------------------+--------+  POP Prox  89                   biphasic                         +----------+--------+-----+--------+-----------------------+--------+  POP Mid   48                   biphasic                         +----------+--------+-----+--------+-----------------------+--------+  POP Distal75                   biphasic                         +----------+--------+-----+--------+-----------------------+--------+  TP Trunk  72                   biphasic                         +----------+--------+-----+--------+-----------------------+--------+  PTA  Distal55                   monophasic                       +----------+--------+-----+--------+-----------------------+--------+  DP        0  Unable to obtain signal          +----------+--------+-----+--------+-----------------------+--------+        Right Stent(s):  +---------------+--------+--------+--------+--------+  Mid SFA        PSV cm/sStenosisWaveformComments  +---------------+--------+--------+--------+--------+  Prox to Stent  154             biphasic          +---------------+--------+--------+--------+--------+  Proximal Stent 166             biphasic          +---------------+--------+--------+--------+--------+  Mid Stent      80              biphasic          +---------------+--------+--------+--------+--------+  Distal Stent   36              biphasic          +---------------+--------+--------+--------+--------+  Distal to Stent117             biphasic          +---------------+--------+--------+--------+--------+               +----------+--------+-----+--------+-------------------+--------+  LEFT      PSV cm/sRatioStenosisWaveform           Comments  +----------+--------+-----+--------+-------------------+--------+  CFA Prox  119                  triphasic                    +----------+--------+-----+--------+-------------------+--------+  CFA Mid   100                  triphasic                    +----------+--------+-----+--------+-------------------+--------+  CFA [id number]                  audibly triphasic            +----------+--------+-----+--------+-------------------+--------+  TP Trunk  167                  monophasic                   +----------+--------+-----+--------+-------------------+--------+  PTA Distal88                   monophasic                   +----------+--------+-----+--------+-------------------+--------+  DP         27                   dampened monophasic          +----------+--------+-----+--------+-------------------+--------+      Left Graft #1: Femoral-popliteal  +--------------------+--------+--------+----------+---------+                      PSV cm/sStenosisWaveform  Comments   +--------------------+--------+--------+----------+---------+  Inflow              202             triphasic            +--------------------+--------+--------+----------+---------+  Proximal Anastomosis170             monophasicTurbulent  +--------------------+--------+--------+----------+---------+  Proximal Graft      89              monophasic           +--------------------+--------+--------+----------+---------+  Mid Graft           47              monophasic           +--------------------+--------+--------+----------+---------+  Distal Graft        48              monophasic           +--------------------+--------+--------+----------+---------+  Distal Anastomosis  45              monophasic           +--------------------+--------+--------+----------+---------+  Outflow             116             monophasic           +--------------------+--------+--------+----------+---------+      Summary:  Right: No significant change compared to previous study. Patent mid SFA  stent.   Left: No significant change as compared to previous study. Patent left  femoral-popliteal bypass graft.   +-------+-----------+-----------+------------+------------+  ABI/TBIToday's ABIToday's TBIPrevious ABIPrevious TBI  +-------+-----------+-----------+------------+------------+  Right  0.79       0.54       0.84        0.0           +-------+-----------+-----------+------------+------------+  Left   0.81       0.57       0.61        Not obtained  +-------+-----------+-----------+------------+------------+    Assessment/Planning: PAD s/p multiple  interventions Dry gangrene changes.  Improving slowly with comparison to previous ischemic skin changes.   Tibial disease with no significant changes on arterial duplex as we as ABI's.  No TBI's on study. Continue maximum medical management with ASA, Plavix and Statin  No vascular intervention is indicated currently.  We will follow as OP.  Recurrent Group B strep bacteremia (2 of 2 blood cx 12/17/21) continue antibiotic coverage - ID directing management TEE scheduled for 12/23/2021   Roxy Horseman 12/22/2021 7:08 AM --  Laboratory Lab Results: Recent Labs    12/21/21 0627  WBC 8.6  HGB 12.1*  HCT 40.0  PLT 255   BMET No results for input(s): "NA", "K", "CL", "CO2", "GLUCOSE", "BUN", "CREATININE", "CALCIUM" in the last 72 hours.  COAG Lab Results  Component Value Date   INR 1.5 (H) 12/17/2021   INR 1.5 (H) 12/17/2021   INR 1.1 08/15/2021   No results found for: "PTT"

## 2021-12-22 NOTE — Progress Notes (Signed)
PT Cancellation Note  Patient Details Name: VINCIENT VANAMAN MRN: [id number] DOB: [birth year]   Cancelled Treatment:    Reason Eval/Treat Not Completed: Patient declined, no reason specified, pt declining all mobility, max encouragement for pt at least OOB to chair and provided education on benefits of mobility, pt continues to decline. Will check back as schedule allows to continue with PT POC.   Audry Riles. PTA Acute Rehabilitation Services Office: [place] 12/22/2021, 1:20 PM

## 2021-12-22 NOTE — Progress Notes (Signed)
ANTICOAGULATION CONSULT NOTE  Pharmacy Consult for Enoxaparin Indication: atrial fibrillation  Allergies  Allergen Reactions   Shellfish Allergy Anaphylaxis and Other (See Comments)    Tongue swelling, hives    Sulfa Antibiotics Anaphylaxis and Rash    Tongue swelling, hives   Ace Inhibitors Other (See Comments) and Cough    CKD, renal failure    Escitalopram Other (See Comments)    Buzzing in ears,headache, felt like a zombie    Evolocumab Other (See Comments)    Myalgias, flu like sx    Fenofibrate Other (See Comments)    Body aches - pt currently taking isnt sure if its causing any pain    Invokana [Canagliflozin] Other (See Comments)    Syncope / dehydration   Lisinopril Cough   Metformin And Related Itching   Pravastatin Sodium Other (See Comments)    myalgias   Crestor [Rosuvastatin] Other (See Comments)    Myalgias    Horse-Derived Products Rash    horse serum   Lexapro [Escitalopram Oxalate] Other (See Comments)    Buzzing in ears,headache, felt like a zombie   Lipitor [Atorvastatin] Other (See Comments)    myalgias   Livalo [Pitavastatin] Other (See Comments)    Myalgias    Milk (Cow) Nausea Only    Ties stomach in knots     Tape Rash    Patient Measurements: Height: '6\' 3"'$  (190.5 cm) Weight: 121.8 kg (268 lb 8.3 oz) IBW/kg (Calculated) : 84.5  Vital Signs: Temp: 97.5 F (36.4 C) (08/07 0357) Temp Source: Oral (08/07 0357) BP: 135/80 (08/07 0357) Pulse Rate: 87 (08/07 0357)  Labs: Recent Labs    12/21/21 0627  HGB 12.1*  HCT 40.0  PLT 255     Estimated Creatinine Clearance: 60.3 mL/min (A) (by C-G formula based on SCr of 1.58 mg/dL (H)).   Medical History: Past Medical History:  Diagnosis Date   Anxiety    Arthritis    Atrial fibrillation (HCC)    CAD (coronary artery disease)    a. 2010: DES to CTO of RCA. EF 55% b. 07/2016: cath showing total occlusion within previously placed RCA stent (collaterals present), severe stenosis  along LCx and OM1 (treated with 2 overlapping DES). c. repeat cath in 01/2018 showing patent stents along LCx and OM with CTO of D2, CTO of distal LCx, and CTO of RCA with collaterals present overall unchanged since 2018 with medical management recom   Cellulitis and abscess rt groin    Complication of anesthesia    " I woke up during a colonoscopy "      Depression    Diabetes mellitus    Diastolic CHF ([place])    Disorders of iron metabolism    Dysrhythmia    Fibromyalgia    GERD (gastroesophageal reflux disease)    History of hiatal hernia    Hyperlipidemia    Hypertension    Low serum testosterone level    Medically noncompliant    Myocardial infarction [facility])    05-23-20   Pneumonia     Medications:  Medications Prior to Admission  Medication Sig Dispense Refill Last Dose   albuterol (VENTOLIN HFA) 108 (90 Base) MCG/ACT inhaler Inhale 2 puffs into the lungs every 6 (six) hours as needed for wheezing or shortness of breath. 8.5 g 0 unknown   Chlorphen-Phenyleph-ASA (ALKA-SELTZER PLUS COLD PO) Take 2 tablets by mouth daily.   12/17/2021   clopidogrel (PLAVIX) 75 MG tablet TAKE 1 TABLET BY MOUTH DAILY 30 tablet 10  12/16/2021 at 2000   dapagliflozin propanediol (FARXIGA) 5 MG TABS tablet Take 1 tablet (5 mg total) by mouth daily before breakfast. (Patient taking differently: Take 10 mg by mouth daily before breakfast.) 90 tablet 2 12/16/2021   DULoxetine (CYMBALTA) 30 MG capsule TAKE 1 CAPSULE BY MOUTH DAILY 90 capsule 3 12/16/2021   ELIQUIS 5 MG TABS tablet TAKE 1 TABLET BY MOUTH TWO TIMES DAILY 60 tablet 2 12/16/2021 at 0830   ezetimibe (ZETIA) 10 MG tablet Take 1 tablet (10 mg total) by mouth daily. 90 tablet 3 12/16/2021   fenofibrate 160 MG tablet Take 1 tablet (160 mg total) by mouth daily. 90 tablet 3 12/16/2021   furosemide (LASIX) 20 MG tablet Take 20 mg by mouth daily.   12/16/2021   gabapentin (NEURONTIN) 300 MG capsule TAKE 1 CAPSULE BY MOUTH 3 TIMES DAILY 90 capsule 10 12/16/2021    HYDROcodone-acetaminophen (NORCO) 10-325 MG tablet Take 1 tablet by mouth every 4 (four) hours as needed for moderate pain. 30 tablet 0 Past Week   insulin lispro (HUMALOG) 100 UNIT/ML KwikPen INJECT 15-20 UNITS SUBCUTANEOUSLY THREE TIMES A DAY WITH MEALS (Patient taking differently: 15-20 Units 3 (three) times daily. Sliding scale) 15 mL 10 12/16/2021   insulin NPH-regular Human (HUMULIN 70/30) (70-30) 100 UNIT/ML injection Inject 60 Units into the skin 2 (two) times daily with a meal. 30 mL 2 12/16/2021   isosorbide mononitrate (IMDUR) 30 MG 24 hr tablet TAKE 1 TABLET BY MOUTH DAILY 30 tablet 10 12/16/2021   Menthol, Topical Analgesic, (BLUE-EMU MAXIMUM STRENGTH EX) Apply 1 application. topically daily as needed (pain).   unknown   metoprolol succinate (TOPROL-XL) 50 MG 24 hr tablet Take 1 tablet by mouth once daily 90 tablet 1 12/16/2021 at 0830   METOPROLOL TARTRATE PO Take 50 mg by mouth as needed (afib).   12/17/2021 at 01050   nitroGLYCERIN (NITROSTAT) 0.4 MG SL tablet DISSOLVE ONE TABLET UNDER THE TONGUE EVERY 5 MINUTES AS NEEDED FOR CHEST PAIN.  DO NOT EXCEED A TOTAL OF 3 DOSES IN 15 MINUTES Strength: 0.4 mg 25 tablet 0 Past Week   pantoprazole (PROTONIX) 40 MG tablet TAKE 1 TABLET BY MOUTH DAILY 30 tablet 10 12/16/2021   tirzepatide (MOUNJARO) 7.5 MG/0.5ML Pen Inject 7.5 mg into the skin once a week. 6 mL 1 12/12/2021   vitamin B-12 (CYANOCOBALAMIN) 1000 MCG tablet Take 1,000 mcg by mouth daily.   12/17/2021   aspirin EC 81 MG tablet Take 1 tablet (81 mg total) by mouth daily. Swallow whole. (Patient not taking: Reported on 12/17/2021) 150 tablet 2 Not Taking   Insulin Syringe-Needle U-100 28G X 1/2" 1 ML MISC Use to give insulin five times daily Dx E11.42 500 each 3    metolazone (ZAROXOLYN) 5 MG tablet Take 1 tablet (5 mg total) by mouth daily as needed (Swelling). (Patient not taking: Reported on 12/17/2021) 30 tablet 3 Completed Course   Assessment: 32 yoM with hx AFib on apixaban at home. Pharmacy  consulted for enoxaparin dosing for now. CBC and Cr stable.  Goal of Therapy:  Monitor platelets by anticoagulation protocol: Yes   Plan:  Lovenox 120 mg bid Monitor for s/s of bleeding and renal function F/u plan to re-start eliquis  Arrie Senate, PharmD, BCPS, [facility] Clinical Pharmacist [phone] Please check AMION for all [place] numbers 12/22/2021

## 2021-12-23 ENCOUNTER — Inpatient Hospital Stay (HOSPITAL_COMMUNITY): Payer: Medicare Other

## 2021-12-23 ENCOUNTER — Encounter (HOSPITAL_COMMUNITY)
Admission: EM | Disposition: A | Discharge status: Home / Self Care | Payer: Self-pay | Source: Home / Self Care | Attending: Internal Medicine

## 2021-12-23 ENCOUNTER — Inpatient Hospital Stay (HOSPITAL_COMMUNITY): Payer: Medicare Other | Admitting: General Practice

## 2021-12-23 DIAGNOSIS — R1310 Dysphagia, unspecified: Secondary | ICD-10-CM

## 2021-12-23 DIAGNOSIS — I340 Nonrheumatic mitral (valve) insufficiency: Secondary | ICD-10-CM

## 2021-12-23 DIAGNOSIS — I080 Rheumatic disorders of both mitral and aortic valves: Secondary | ICD-10-CM

## 2021-12-23 DIAGNOSIS — S81802D Unspecified open wound, left lower leg, subsequent encounter: Secondary | ICD-10-CM | POA: Diagnosis not present

## 2021-12-23 DIAGNOSIS — Z794 Long term (current) use of insulin: Secondary | ICD-10-CM

## 2021-12-23 DIAGNOSIS — A419 Sepsis, unspecified organism: Secondary | ICD-10-CM | POA: Diagnosis not present

## 2021-12-23 DIAGNOSIS — R7881 Bacteremia: Secondary | ICD-10-CM | POA: Diagnosis not present

## 2021-12-23 DIAGNOSIS — B951 Streptococcus, group B, as the cause of diseases classified elsewhere: Secondary | ICD-10-CM | POA: Diagnosis not present

## 2021-12-23 DIAGNOSIS — E1151 Type 2 diabetes mellitus with diabetic peripheral angiopathy without gangrene: Secondary | ICD-10-CM

## 2021-12-23 DIAGNOSIS — F418 Other specified anxiety disorders: Secondary | ICD-10-CM

## 2021-12-23 HISTORY — PX: TEE WITHOUT CARDIOVERSION: SHX5443

## 2021-12-23 HISTORY — PX: BUBBLE STUDY: SHX6837

## 2021-12-23 LAB — BASIC METABOLIC PANEL
Anion gap: 11 (ref 5–15)
BUN: 19 mg/dL (ref 8–23)
CO2: 27 mmol/L (ref 22–32)
Calcium: 9.6 mg/dL (ref 8.9–10.3)
Chloride: 94 mmol/L — ABNORMAL LOW (ref 98–111)
Creatinine, Ser: 1.38 mg/dL — ABNORMAL HIGH (ref 0.61–1.24)
GFR, Estimated: 55 mL/min — ABNORMAL LOW (ref >60)
Glucose, Bld: 255 mg/dL — ABNORMAL HIGH (ref 70–99)
Potassium: 4.7 mmol/L (ref 3.5–5.1)
Sodium: 132 mmol/L — ABNORMAL LOW (ref 135–145)

## 2021-12-23 LAB — GLUCOSE, CAPILLARY
Glucose-Capillary: 239 mg/dL — ABNORMAL HIGH (ref 70–99)
Glucose-Capillary: 243 mg/dL — ABNORMAL HIGH (ref 70–99)
Glucose-Capillary: 187 mg/dL — ABNORMAL HIGH (ref 70–99)
Glucose-Capillary: 181 mg/dL — ABNORMAL HIGH (ref 70–99)

## 2021-12-23 LAB — PROTIME-INR
INR: 1.2 (ref 0.8–1.2)
Prothrombin Time: 14.7 seconds (ref 11.4–15.2)

## 2021-12-23 LAB — ECHO TEE
AV Vena cont: 0.5 cm
MV M vel: 4.18 m/s
MV Peak grad: 69.9 mmHg
P 1/2 time: 786 msec

## 2021-12-23 SURGERY — ECHOCARDIOGRAM, TRANSESOPHAGEAL
Anesthesia: Monitor Anesthesia Care

## 2021-12-23 MED ORDER — BUTAMBEN-TETRACAINE-BENZOCAINE 2-2-14 % EX AERO
INHALATION_SPRAY | CUTANEOUS | Status: DC | PRN
  Administered 2021-12-23: 2 via TOPICAL

## 2021-12-23 MED ORDER — AMOXICILLIN 500 MG PO CAPS: 1000.0000 mg | ORAL_CAPSULE | Freq: Three times a day (TID) | ORAL | 0 refills | Status: AC

## 2021-12-23 MED ORDER — ACETAMINOPHEN 325 MG PO TABS
650.0000 mg | ORAL_TABLET | Freq: Four times a day (QID) | ORAL | Status: DC | PRN
Start: 2021-12-23 — End: 2022-02-05

## 2021-12-23 MED ORDER — PROPOFOL 500 MG/50ML IV EMUL
INTRAVENOUS | Status: DC | PRN
  Administered 2021-12-23: 100 ug/kg/min via INTRAVENOUS

## 2021-12-23 MED ORDER — SODIUM CHLORIDE 0.9 % IV SOLN: INTRAVENOUS | Status: DC

## 2021-12-23 MED ORDER — LIDOCAINE 2% (20 MG/ML) 5 ML SYRINGE
INTRAMUSCULAR | Status: DC | PRN
  Administered 2021-12-23: 100 mg via INTRAVENOUS

## 2021-12-23 MED ORDER — PROPOFOL 10 MG/ML IV BOLUS
INTRAVENOUS | Status: DC | PRN
  Administered 2021-12-23: 20 mg via INTRAVENOUS

## 2021-12-23 MED ORDER — PHENYLEPHRINE 80 MCG/ML (10ML) SYRINGE FOR IV PUSH (FOR BLOOD PRESSURE SUPPORT)
PREFILLED_SYRINGE | INTRAVENOUS | Status: DC | PRN
  Administered 2021-12-23 (×9): 160 ug via INTRAVENOUS

## 2021-12-23 MED ORDER — AMOXICILLIN 500 MG PO CAPS
1000.0000 mg | ORAL_CAPSULE | Freq: Three times a day (TID) | ORAL | Status: DC
  Filled 2021-12-23 (×2): qty 2

## 2021-12-23 NOTE — Plan of Care (Signed)
  Problem: Nutrition: Goal: Adequate nutrition will be maintained Outcome: Progressing   Problem: Elimination: Goal: Will not experience complications related to urinary retention Outcome: Progressing   Problem: Pain Managment: Goal: General experience of comfort will improve Outcome: Progressing   Problem: Safety: Goal: Ability to remain free from injury will improve Outcome: Progressing   

## 2021-12-23 NOTE — Progress Notes (Signed)
Occupational Therapy Treatment Patient Details Name: [name] [name] MRN: [id number] DOB: 1950/12/19 Today's Date: 12/23/2021   History of present illness Pt adm 8/2 with AMS due to sepsis. Initially thought from LE wounds but per vascular wounds not source of infection. PMH:  PAF, HTN, HLD, DM, prior CVA, PAD, CAD with unstable angina, NSTEMI   OT comments  Patient received in supine asleep but easily aroused. Patient agreed to participate with OT and was able to get to EOB without assistance but had complaints of dizziness and required increased time before attempting to stand. Patient performed grooming standing at sink, functional mobility, and transfer training with RW and min guard assist for safety. Patient completed treatment in recliner with wife in room. Acute OT to continue to follow.    Recommendations for follow up therapy are one component of a multi-disciplinary discharge planning process, led by the attending physician.  Recommendations may be updated based on patient status, additional functional criteria and insurance authorization.    Follow Up Recommendations  No OT follow up    Assistance Recommended at Discharge PRN  Patient can return home with the following  A little help with bathing/dressing/bathroom;Assistance with cooking/housework;Assist for transportation;A little help with walking and/or transfers   Equipment Recommendations  None recommended by OT    Recommendations for Other Services      Precautions / Restrictions Precautions Precautions: Fall Restrictions Weight Bearing Restrictions: No       Mobility Bed Mobility Overal bed mobility: Needs Assistance Bed Mobility: Supine to Sit     Supine to sit: Supervision, HOB elevated     General bed mobility comments: stated he felt dizzy initally when coming to EOB    Transfers Overall transfer level: Needs assistance Equipment used: Rolling walker (2 wheels) Transfers: Sit to/from Stand Sit to  Stand: Min guard           General transfer comment: min guard for safety     Balance Overall balance assessment: Needs assistance Sitting-balance support: Feet unsupported, No upper extremity supported Sitting balance-Leahy Scale: Good     Standing balance support: Bilateral upper extremity supported, No upper extremity supported, During functional activity Standing balance-Leahy Scale: Fair Standing balance comment: able to stand at sink without UE support                           ADL either performed or assessed with clinical judgement   ADL Overall ADL's : Needs assistance/impaired     Grooming: Wash/dry hands;Wash/dry face;Min guard;Standing Grooming Details (indicate cue type and reason): standing at sink                 Toilet Transfer: Min guard;Ambulation;Rolling walker (2 wheels) Toilet Transfer Details (indicate cue type and reason): simulated to recliner           General ADL Comments: focused on standing at sink and transfers    Extremity/Trunk Assessment              Vision       Perception     Praxis      Cognition Arousal/Alertness: Awake/alert Behavior During Therapy: WFL for tasks assessed/performed Overall Cognitive Status: Within Functional Limits for tasks assessed                                 General Comments: joking throughout session; eager to return home  Exercises      Shoulder Instructions       General Comments      Pertinent Vitals/ Pain       Pain Assessment Pain Assessment: Faces Faces Pain Scale: Hurts even more Pain Location: back Pain Descriptors / Indicators: Grimacing, Guarding Pain Intervention(s): Monitored during session, Repositioned  Home Living                                          Prior Functioning/Environment              Frequency  Min 2X/week        Progress Toward Goals  OT Goals(current goals can now be found in  the care plan section)  Progress towards OT goals: Progressing toward goals  Acute Rehab OT Goals Patient Stated Goal: go home OT Goal Formulation: With patient/family Time For Goal Achievement: 01/02/22 Potential to Achieve Goals: Good ADL Goals Pt Will Perform Grooming: with modified independence;standing Pt Will Perform Lower Body Bathing: with modified independence;sit to/from stand Pt Will Perform Lower Body Dressing: with modified independence;sit to/from stand Pt Will Transfer to Toilet: with modified independence;ambulating;regular height toilet;grab bars Pt Will Perform Toileting - Clothing Manipulation and hygiene: with modified independence;sit to/from stand Pt Will Perform Tub/Shower Transfer: Shower transfer;with supervision;rolling walker;shower seat;ambulating  Plan Discharge plan remains appropriate    Co-evaluation                 AM-PAC OT "6 Clicks" Daily Activity     Outcome Measure   Help from another person eating meals?: None Help from another person taking care of personal grooming?: A Little Help from another person toileting, which includes using toliet, bedpan, or urinal?: A Little Help from another person bathing (including washing, rinsing, drying)?: A Little Help from another person to put on and taking off regular upper body clothing?: None Help from another person to put on and taking off regular lower body clothing?: A Little 6 Click Score: 20    End of Session Equipment Utilized During Treatment: Rolling walker (2 wheels)  OT Visit Diagnosis: Unsteadiness on feet (R26.81);Repeated falls (R29.6);Muscle weakness (generalized) (M62.81);Pain   Activity Tolerance Patient tolerated treatment well   Patient Left in chair;with call bell/phone within reach;with family/visitor present;with nursing/sitter in room   Nurse Communication Mobility status        Time: 4388-8757 OT Time Calculation (min): 27 min  Charges: OT General Charges $OT  Visit: 1 Visit OT Treatments $Self Care/Home Management : 8-22 mins $Therapeutic Activity: 8-22 mins  Lodema Hong, OTA Acute Rehabilitation Services  Office [phone]   Trixie Dredge 12/23/2021, 12:50 PM

## 2021-12-23 NOTE — Interval H&P Note (Signed)
History and Physical Interval Note:  12/23/2021 1:49 PM  [name] [name]  has presented today for surgery, with the diagnosis of BACTERIMIA.  The various methods of treatment have been discussed with the patient and family. After consideration of risks, benefits and other options for treatment, the patient has consented to  Procedure(s): TRANSESOPHAGEAL ECHOCARDIOGRAM (TEE) (N/A) as a surgical intervention.  The patient's history has been reviewed, patient examined, no change in status, stable for surgery.  I have reviewed the patient's chart and labs.  Questions were answered to the patient's satisfaction.     Elouise Munroe

## 2021-12-23 NOTE — CV Procedure (Signed)
INDICATIONS: Bacteremia  PROCEDURE:   Informed consent was obtained prior to the procedure. The risks, benefits and alternatives for the procedure were discussed and the patient comprehended these risks.  Risks include, but are not limited to, cough, sore throat, vomiting, nausea, somnolence, esophageal and stomach trauma or perforation, bleeding, low blood pressure, aspiration, pneumonia, infection, trauma to the teeth and death.    After a procedural time-out, the oropharynx was anesthetized with 20% benzocaine spray.   During this procedure the patient was administered propofol per anesthesia.  The patient's heart rate, blood pressure, and oxygen saturation were monitored continuously during the procedure. The period of conscious sedation was 35 minutes, of which I was present face-to-face 100% of this time.  The transesophageal probe was inserted in the esophagus and stomach without difficulty and multiple views were obtained.  The patient was kept under observation until the patient left the procedure room.  The patient left the procedure room in stable condition.   Agitated microbubble saline contrast was administered.  COMPLICATIONS:    There were no immediate complications.  FINDINGS:   FORMAL ECHOCARDIOGRAM REPORT PENDING No vegetations. Moderate-severe to severe mitral valve regurgitation, likely functional MR with multiple commissural jets and lack of complete coaptation. Possible systolic flow reversal in the RUPV.  Moderate aortic valve regurgitation.   RECOMMENDATIONS:    Return to room when alert  Time Spent Directly with the Patient:  60 minutes   Elouise Munroe 12/23/2021, 4:15 PM

## 2021-12-23 NOTE — Progress Notes (Signed)
  Echocardiogram Echocardiogram Transesophageal has been performed.  Johny Chess 12/23/2021, 2:57 PM

## 2021-12-23 NOTE — Progress Notes (Signed)
PT Cancellation Note  Patient Details Name: KAYMEN ADRIAN MRN: [id number] DOB: 06/24/1950   Cancelled Treatment:    Reason Eval/Treat Not Completed: Patient at procedure or test/unavailable. Will check back later in afternoon.   Leighton Roach, PT  Acute Rehab Services Secure chat preferred Office [place] 12/23/2021, 1:46 PM

## 2021-12-23 NOTE — Progress Notes (Signed)
PT Cancellation Note  Patient Details Name: [name] [name] MRN: [id number] DOB: 12/03/1950   Cancelled Treatment:    Reason Eval/Treat Not Completed: Patient's level of consciousness. Pt back from TEE but very groggy, will hold PT until tomorrow.   Leighton Roach, PT  Acute Rehab Services Secure chat preferred Office [place] 12/23/2021, 3:58 PM
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
## 2021-12-23 NOTE — Anesthesia Postprocedure Evaluation (Signed)
Anesthesia Post Note  Patient: Render A Dundon  Procedure(s) Performed: TRANSESOPHAGEAL ECHOCARDIOGRAM (TEE) BUBBLE STUDY     Patient location during evaluation: Endoscopy Anesthesia Type: MAC Level of consciousness: sedated, patient cooperative and oriented Pain management: pain level controlled Vital Signs Assessment: post-procedure vital signs reviewed and stable Respiratory status: spontaneous breathing, nonlabored ventilation and respiratory function stable Cardiovascular status: blood pressure returned to baseline and stable Postop Assessment: no apparent nausea or vomiting Anesthetic complications: no   No notable events documented.  Last Vitals:  Vitals:   12/23/21 1511 12/23/21 1528  BP: 111/69 92/76  Pulse: 78 80  Resp: 13 16  Temp:  36.4 C  SpO2: 97% 96%    Last Pain:  Vitals:   12/23/21 1528  TempSrc: Oral  PainSc: 0-No pain                 Verlean Allport,E. Chong January

## 2021-12-23 NOTE — Anesthesia Procedure Notes (Signed)
Procedure Name: MAC Date/Time: 12/23/2021 1:58 PM  Performed by: Dorann Lodge, CRNAPre-anesthesia Checklist: Patient identified, Emergency Drugs available, Suction available and Patient being monitored Patient Re-evaluated:Patient Re-evaluated prior to induction Oxygen Delivery Method: Nasal cannula Airway Equipment and Method: Bite block Dental Injury: Teeth and Oropharynx as per pre-operative assessment

## 2021-12-23 NOTE — Transfer of Care (Signed)
Immediate Anesthesia Transfer of Care Note  Patient: Rohail A Booze  Procedure(s) Performed: TRANSESOPHAGEAL ECHOCARDIOGRAM (TEE) BUBBLE STUDY  Patient Location: Endoscopy Unit  Anesthesia Type:MAC  Level of Consciousness: drowsy  Airway & Oxygen Therapy: Patient Spontanous Breathing  Post-op Assessment: Report given to RN and Post -op Vital signs reviewed and stable  Post vital signs: Reviewed and stable  Last Vitals:  Vitals Value Taken Time  BP    Temp 36.1 C 12/23/21 1448  Pulse 64 12/23/21 1449  Resp 13 12/23/21 1449  SpO2 95 % 12/23/21 1449  Vitals shown include unvalidated device data.  Last Pain:  Vitals:   12/23/21 1448  TempSrc: Temporal  PainSc: 0-No pain      Patients Stated Pain Goal: 0 (17/91/50 5697)  Complications: No notable events documented.

## 2021-12-23 NOTE — Progress Notes (Signed)
Discharge instructions provided to patient. All medications, follow up appointments, and discharge instructions provided. IV out. Monitor off CCMD notified. Discharging home with wife.  Dannel Rafter R Richell Corker, RN  

## 2021-12-23 NOTE — Progress Notes (Signed)
[facility] Infectious Diseases Follow Up Note  Patient Identification: Patient Name: LEEMON AYALA MRN: [id number] Admit Date: 12/17/2021  1:06 PM Age: 71 y.o.Today's Date: 12/23/2021   Reason for Visit: Follow up on bacteremia  Principal Problem:   Sepsis ([place]) Active Problems:   Hyperlipidemia associated with type 2 diabetes mellitus ([place])   Hypertension associated with diabetes ([place])   CAD S/P percutaneous coronary angioplasty   GERD (gastroesophageal reflux disease)   Morbid obesity ([place])   Leg wound, left   Coronary artery disease of native artery of native heart with stable angina pectoris (H[facility])   Pressure injury of skin   Bacteremia  Antibiotics:  Vancomycin 8/2, Pen G 8/3-c Cefepime 8/2  Metronidazole 8/2   Lines/Hardware: PIV   Interval Events: Continues to be afebrile, no leukocytosis, TEE today  Assessment # Recurrent Group B streptococcus bacteremia- possible source could be bilateral foot pressure ulcers and dry gangerene but wounds improving per wife and vascular. R/o endocarditis  - Previous Group B strep bacteremia in 07/25/21, no repeat blood cx drawn for clearance,  treated with Vancomycin> Pen G> Amoxicillin for 10 days on discharge. TTE 3/14 mild calcification of the  aortic valve. There is mild thickening of the aortic valve. Aortic valve  - Loop recorder + - TEE prelim 8/8 No vegetations. Moderate-severe to severe mitral valve regurgitation, likely functional MR with multiple commissural jets and lack of complete coaptation. Possible systolic flow reversal in the RUPV.  Moderate aortic valve regurgitation.   # Dysphagia 8/7 esophagogram with no stricture and esophageal dysmotility.  # Staph epidermidis in 1/2 sets on 8/4 likely a contaminant   # PAD s/p multiple vascular intervention  # Chronic dry gangrene of left 1st, 2nd, 3rd and 4th toes and rt 5th toe with multiple pressure ulcers in the bilateral  ankle and foot  - ABI 8/4 with evidence of bilateral vascular disease  - 8/4  Patent mid SFA stent ( rt ). Patent left femoral-popliteal bypass graft ( lt) - Vascular following    # Acute on chronic lower back pain - MRI T L spine 8/5 unremarkable   Recommendations Ok to switch to high dose PO amoxicillin. Plan for 2 weeks course from 8/4. EOT 01/02/22  Defer management of aortic and mitral valvulopathy in prelim TEE  per Cardiology/CTVS  Will arrange fu in the clinic in 4 weeks for surveillance blood cultures given valvulopathy. Otherwise, ID will sign off for now.   D/w Dr Thereasa Solo   Rest of the management as per the primary team. Thank you for the consult. Please page with pertinent questions or concerns.  ______________________________________________________________________ Subjective patient seen and examined at the bedside.  Wife at bedside No complaints   Vitals BP 112/66 (BP Location: Right Arm)   Pulse 89   Temp 97.6 F (36.4 C) (Oral)   Resp 16   Ht '6\' 3"'$  (1.905 m)   Wt 121.8 kg   SpO2 97%   BMI 33.56 kg/m     Physical Exam Patient was sitting in wheel chair and was on the way to OR for TEE  Appears comfortable, not in acute distress Respiratory effort wnl   Pertinent Microbiology Results for orders placed or performed during the hospital encounter of 12/17/21  Resp Panel by RT-PCR (Flu A&B, Covid) Anterior Nasal Swab     Status: None   Collection Time: 12/17/21  1:24 PM   Specimen: Anterior Nasal Swab  Result Value Ref Range Status   SARS Coronavirus 2  by RT PCR NEGATIVE NEGATIVE Final    Comment: (NOTE) SARS-CoV-2 target nucleic acids are NOT DETECTED.  The SARS-CoV-2 RNA is generally detectable in upper respiratory specimens during the acute phase of infection. The lowest concentration of SARS-CoV-2 viral copies this assay can detect is 138 copies/mL. A negative result does not preclude SARS-Cov-2 infection and should not be used as the sole basis  for treatment or other patient management decisions. A negative result may occur with  improper specimen collection/handling, submission of specimen other than nasopharyngeal swab, presence of viral mutation(s) within the areas targeted by this assay, and inadequate number of viral copies(<138 copies/mL). A negative result must be combined with clinical observations, patient history, and epidemiological information. The expected result is Negative.  Fact Sheet for Patients:  EntrepreneurPulse.com.au  Fact Sheet for Healthcare Providers:  IncredibleEmployment.be  This test is no t yet approved or cleared by the [place] FDA and  has been authorized for detection and/or diagnosis of SARS-CoV-2 by FDA under an Emergency Use Authorization (EUA). This EUA will remain  in effect (meaning this test can be used) for the duration of the COVID-19 declaration under Section 564(b)(1) of the Act, 21 U.S.C.section 360bbb-3(b)(1), unless the authorization is terminated  or revoked sooner.       Influenza A by PCR NEGATIVE NEGATIVE Final   Influenza B by PCR NEGATIVE NEGATIVE Final    Comment: (NOTE) The Xpert Xpress SARS-CoV-2/FLU/RSV plus assay is intended as an aid in the diagnosis of influenza from Nasopharyngeal swab specimens and should not be used as a sole basis for treatment. Nasal washings and aspirates are unacceptable for Xpert Xpress SARS-CoV-2/FLU/RSV testing.  Fact Sheet for Patients: EntrepreneurPulse.com.au  Fact Sheet for Healthcare Providers: IncredibleEmployment.be  This test is not yet approved or cleared by the [place] FDA and has been authorized for detection and/or diagnosis of SARS-CoV-2 by FDA under an Emergency Use Authorization (EUA). This EUA will remain in effect (meaning this test can be used) for the duration of the COVID-19 declaration under Section 564(b)(1) of the Act, 21  U.S.C. section 360bbb-3(b)(1), unless the authorization is terminated or revoked.  Performed at [facility], [address], [place], [place] [address]   Culture, blood (Routine x 2)     Status: Abnormal   Collection Time: 12/17/21  1:43 PM   Specimen: BLOOD RIGHT HAND  Result Value Ref Range Status   Specimen Description   Final    BLOOD RIGHT HAND Performed at [facility], [address], [place], [place] [address]    Special Requests   Final    BOTTLES DRAWN AEROBIC AND ANAEROBIC Blood Culture adequate volume Performed at [place] [place],  [address]    Culture  Setup Time   Final    GRAM POSITIVE COCCI BOTTLES DRAWN AEROBIC AND ANAEROBIC Gram Stain Report Called to,Read Back By and Verified With: KNIGHT,S'@0515'$  BY MATTHEWS, B 8.3.2023  Organism ID to follow CRITICAL RESULT CALLED TO, READ BACK BY AND VERIFIED WITH: Jule Economy [id number] MLM Performed at [facility], [place] [address], [place], [name] [address]    Culture GROUP B STREP(S.AGALACTIAE)ISOLATED (A)  Final   Report Status 12/20/2021 FINAL  Final   Organism ID, Bacteria GROUP B STREP(S.AGALACTIAE)ISOLATED  Final      Susceptibility   Group b strep(s.agalactiae)isolated - MIC*    CLINDAMYCIN <=0.25 SENSITIVE Sensitive     AMPICILLIN <=0.25 SENSITIVE Sensitive     ERYTHROMYCIN <=0.12 SENSITIVE Sensitive  VANCOMYCIN 0.5 SENSITIVE Sensitive     CEFTRIAXONE <=0.12 SENSITIVE Sensitive     LEVOFLOXACIN 1 SENSITIVE Sensitive     PENICILLIN Value in next row Sensitive      SENSITIVE<=0.06    * GROUP B STREP(S.AGALACTIAE)ISOLATED  Culture, blood (Routine x 2)     Status: Abnormal   Collection Time: 12/17/21  1:43 PM   Specimen: BLOOD  Result Value Ref Range Status   Specimen Description   Final    BLOOD LEFT ANTECUBITAL Performed at [facility], [place] [address], [place], [place] [address]    Special Requests   Final    BOTTLES DRAWN AEROBIC AND ANAEROBIC Blood Culture  adequate volume Performed at [facility], [address] [place], [place] [address]    Culture  Setup Time   Final    GRAM POSITIVE COCCI ANAEROBIC BOTTLE CRITICAL VALUE NOTED.  VALUE IS CONSISTENT WITH PREVIOUSLY REPORTED AND CALLED VALUE. Performed at [facility], [address], [place], [place] [address]    Culture (A)  Final    GROUP B STREP(S.AGALACTIAE)ISOLATED SUSCEPTIBILITIES PERFORMED ON PREVIOUS CULTURE WITHIN THE LAST 5 DAYS. Performed at [facility], [place] [address], [place], [place] [address]    Report Status 12/20/2021 FINAL  Final  Blood Culture ID Panel (Reflexed)     Status: Abnormal   Collection Time: 12/17/21  1:43 PM  Result Value Ref Range Status   Enterococcus faecalis NOT DETECTED NOT DETECTED Final   Enterococcus Faecium NOT DETECTED NOT DETECTED Final   Listeria monocytogenes NOT DETECTED NOT DETECTED Final   Staphylococcus species NOT DETECTED NOT DETECTED Final   Staphylococcus aureus (BCID) NOT DETECTED NOT DETECTED Final   Staphylococcus epidermidis NOT DETECTED NOT DETECTED Final   Staphylococcus lugdunensis NOT DETECTED NOT DETECTED Final   Streptococcus species DETECTED (A) NOT DETECTED Final    Comment: CRITICAL RESULT CALLED TO, READ BACK BY AND VERIFIED WITH: PHARMD AUSTIN P [id number] 1130 MLM    Streptococcus agalactiae DETECTED (A) NOT DETECTED Final    Comment: CRITICAL RESULT CALLED TO, READ BACK BY AND VERIFIED WITH: PHARMD AUSTIN P [id number] 1130 MLM    Streptococcus pneumoniae NOT DETECTED NOT DETECTED Final   Streptococcus pyogenes NOT DETECTED NOT DETECTED Final   A.calcoaceticus-baumannii NOT DETECTED NOT DETECTED Final   Bacteroides fragilis NOT DETECTED NOT DETECTED Final   Enterobacterales NOT DETECTED NOT DETECTED Final   Enterobacter cloacae complex NOT DETECTED NOT DETECTED Final   Escherichia coli NOT DETECTED NOT DETECTED Final   Klebsiella aerogenes NOT DETECTED NOT DETECTED Final   Klebsiella oxytoca NOT DETECTED NOT  DETECTED Final   Klebsiella pneumoniae NOT DETECTED NOT DETECTED Final   Proteus species NOT DETECTED NOT DETECTED Final   Salmonella species NOT DETECTED NOT DETECTED Final   Serratia marcescens NOT DETECTED NOT DETECTED Final   Haemophilus influenzae NOT DETECTED NOT DETECTED Final   Neisseria meningitidis NOT DETECTED NOT DETECTED Final   Pseudomonas aeruginosa NOT DETECTED NOT DETECTED Final   Stenotrophomonas maltophilia NOT DETECTED NOT DETECTED Final   Candida albicans NOT DETECTED NOT DETECTED Final   Candida auris NOT DETECTED NOT DETECTED Final   Candida glabrata NOT DETECTED NOT DETECTED Final   Candida krusei NOT DETECTED NOT DETECTED Final   Candida parapsilosis NOT DETECTED NOT DETECTED Final   Candida tropicalis NOT DETECTED NOT DETECTED Final   Cryptococcus neoformans/gattii NOT DETECTED NOT DETECTED Final    Comment: Performed at [facility], [address] [place], [name]  [address]  Urine Culture     Status: Abnormal   Collection Time: 12/17/21  3:23 PM   Specimen: In/Out Cath Urine  Result Value Ref Range Status   Specimen Description   Final    IN/OUT CATH URINE Performed at [facility], [address] [place], [place] [address]    Special Requests   Final    NONE Performed at [facility], [address], [place], [place] [address]    Culture 3,000 COLONIES/mL ENTEROCOCCUS FAECALIS (A)  Final   Report Status 12/20/2021 FINAL  Final   Organism ID, Bacteria ENTEROCOCCUS FAECALIS (A)  Final      Susceptibility   Enterococcus faecalis - MIC*    AMPICILLIN <=2 SENSITIVE Sensitive     NITROFURANTOIN <=16 SENSITIVE Sensitive     VANCOMYCIN 2 SENSITIVE Sensitive     * 3,000 COLONIES/mL ENTEROCOCCUS FAECALIS  Culture, blood (Routine X 2) w Reflex to ID Panel     Status: Abnormal   Collection Time: 12/19/21 12:33 PM   Specimen: BLOOD  Result Value Ref Range Status   Specimen Description BLOOD RIGHT ANTECUBITAL  Final   Special Requests   Final     BOTTLES DRAWN AEROBIC AND ANAEROBIC Blood Culture results may not be optimal due to an inadequate volume of blood received in culture bottles   Culture  Setup Time   Final    GRAM POSITIVE COCCI ANAEROBIC BOTTLE ONLY Organism ID to follow CRITICAL RESULT CALLED TO, READ BACK BY AND VERIFIED WITH: H VON DOHLEN,PHARMD'@2352'$  12/20/21 [place]    Culture (A)  Final    STAPHYLOCOCCUS EPIDERMIDIS THE SIGNIFICANCE OF ISOLATING THIS ORGANISM FROM A SINGLE SET OF BLOOD CULTURES WHEN MULTIPLE SETS ARE DRAWN IS UNCERTAIN. PLEASE NOTIFY THE MICROBIOLOGY DEPARTMENT WITHIN ONE WEEK IF SPECIATION AND SENSITIVITIES ARE REQUIRED. Performed at [facility], [place] [address] [place], [place] [address]    Report Status 12/22/2021 FINAL  Final  Blood Culture ID Panel (Reflexed)     Status: Abnormal   Collection Time: 12/19/21 12:33 PM  Result Value Ref Range Status   Enterococcus faecalis NOT DETECTED NOT DETECTED Final   Enterococcus Faecium NOT DETECTED NOT DETECTED Final   Listeria monocytogenes NOT DETECTED NOT DETECTED Final   Staphylococcus species DETECTED (A) NOT DETECTED Final    Comment: CRITICAL RESULT CALLED TO, READ BACK BY AND VERIFIED WITH: H VAN DOHLEN,PHARMD'@2351'$  12/20/21 [place]    Staphylococcus aureus (BCID) NOT DETECTED NOT DETECTED Final   Staphylococcus epidermidis DETECTED (A) NOT DETECTED Final    Comment: Methicillin (oxacillin) resistant coagulase negative staphylococcus. Possible blood culture contaminant (unless isolated from more than one blood culture draw or clinical case suggests pathogenicity). No antibiotic treatment is indicated for blood  culture contaminants. CRITICAL RESULT CALLED TO, READ BACK BY AND VERIFIED WITH: H VAN DOHLEN,PHARMD'@2351'$  12/20/21 [place]    Staphylococcus lugdunensis NOT DETECTED NOT DETECTED Final   Streptococcus species NOT DETECTED NOT DETECTED Final   Streptococcus agalactiae NOT DETECTED NOT DETECTED Final   Streptococcus pneumoniae NOT DETECTED NOT  DETECTED Final   Streptococcus pyogenes NOT DETECTED NOT DETECTED Final   A.calcoaceticus-baumannii NOT DETECTED NOT DETECTED Final   Bacteroides fragilis NOT DETECTED NOT DETECTED Final   Enterobacterales NOT DETECTED NOT DETECTED Final   Enterobacter cloacae complex NOT DETECTED NOT DETECTED Final   Escherichia coli NOT DETECTED NOT DETECTED Final   Klebsiella aerogenes NOT DETECTED NOT DETECTED Final   Klebsiella oxytoca NOT DETECTED NOT DETECTED Final   Klebsiella pneumoniae NOT DETECTED  NOT DETECTED Final   Proteus species NOT DETECTED NOT DETECTED Final   Salmonella species NOT DETECTED NOT DETECTED Final   Serratia marcescens NOT DETECTED NOT DETECTED Final   Haemophilus influenzae NOT DETECTED NOT DETECTED Final   Neisseria meningitidis NOT DETECTED NOT DETECTED Final   Pseudomonas aeruginosa NOT DETECTED NOT DETECTED Final   Stenotrophomonas maltophilia NOT DETECTED NOT DETECTED Final   Candida albicans NOT DETECTED NOT DETECTED Final   Candida auris NOT DETECTED NOT DETECTED Final   Candida glabrata NOT DETECTED NOT DETECTED Final   Candida krusei NOT DETECTED NOT DETECTED Final   Candida parapsilosis NOT DETECTED NOT DETECTED Final   Candida tropicalis NOT DETECTED NOT DETECTED Final   Cryptococcus neoformans/gattii NOT DETECTED NOT DETECTED Final   Methicillin resistance mecA/C DETECTED (A) NOT DETECTED Final    Comment: CRITICAL RESULT CALLED TO, READ BACK BY AND VERIFIED WITH: H VAN DOHLEN,PHARMD'@2351'$  12/20/21 [place] Performed at [facility], [address] [place], [place] [address]   Culture, blood (Routine X 2) w Reflex to ID Panel     Status: None (Preliminary result)   Collection Time: 12/19/21 12:47 PM   Specimen: BLOOD  Result Value Ref Range Status   Specimen Description BLOOD BLOOD LEFT HAND  Final   Special Requests   Final    BOTTLES DRAWN AEROBIC ONLY Blood Culture results may not be optimal due to an inadequate volume of blood received in culture  bottles   Culture   Final    NO GROWTH 4 DAYS Performed at [facility], [place] [address], [place],  [address]    Report Status PENDING  Incomplete   *Note: Due to a large number of results and/or encounters for the requested time period, some results have not been displayed. A complete set of results can be found in Results Review.    Pertinent Lab.    Latest Ref Rng & Units 12/22/2021    9:36 AM 12/21/2021    6:27 AM 12/19/2021    4:03 AM  CBC  WBC 4.0 - 10.5 K/uL 10.0  8.6  12.3   Hemoglobin 13.0 - 17.0 g/dL 11.9  12.1  11.6   Hematocrit 39.0 - 52.0 % 40.5  40.0  37.9   Platelets 150 - 400 K/uL 310  255  234       Latest Ref Rng & Units 12/23/2021    8:50 AM 12/19/2021    4:03 AM 12/18/2021    3:05 AM  CMP  Glucose 70 - 99 mg/dL 255  218  217   BUN 8 - 23 mg/dL '19  29  26   '$ Creatinine 0.61 - 1.24 mg/dL 1.38  1.58  1.73   Sodium 135 - 145 mmol/L 132  128  132   Potassium 3.5 - 5.1 mmol/L 4.7  4.0  4.4   Chloride 98 - 111 mmol/L 94  94  94   CO2 22 - 32 mmol/L '27  23  25   '$ Calcium 8.9 - 10.3 mg/dL 9.6  9.1  9.6   Total Protein 6.5 - 8.1 g/dL  7.1  8.2   Total Bilirubin 0.3 - 1.2 mg/dL  1.2  2.1   Alkaline Phos 38 - 126 U/L  41  45   AST 15 - 41 U/L  38  51   ALT 0 - 44 U/L  21  19      Pertinent Imaging today Plain films and CT images have been personally visualized  and interpreted; radiology reports have been reviewed. Decision making incorporated into the Impression / Recommendations.  ECHO TEE  Result Date: 12/23/2021    TRANSESOPHOGEAL ECHO REPORT   Patient Name:   SHAYN MADOLE Date of Exam: 12/23/2021 Medical Rec #:  [id number]    Height:       74.0 in Accession #:    [id number]   Weight:       280.0 lb Date of Birth:  [birth year]    BSA:          2.508 m Patient Age:    23 years     BP:           108/74 mmHg Patient Gender: M            HR:           72 bpm. Exam Location:  Inpatient Procedure: Transesophageal Echo, 3D Echo, Color Doppler and Cardiac Doppler  Indications:     Bacteremia  History:         Patient has prior history of Echocardiogram examinations, most                  recent 12/19/2021. CAD, Arrythmias:Atrial Fibrillation,                  Signs/Symptoms:Bacteremia and Murmur; Risk Factors:Hypertension                  and Dyslipidemia.  Sonographer:     Johny Chess RDCS Referring Phys:  [place] Diagnosing Phys: Cherlynn Kaiser MD PROCEDURE: After discussion of the risks and benefits of a TEE, an informed consent was obtained from the patient. TEE procedure time was 33 minutes. The transesophogeal probe was passed without difficulty through the esophogus of the patient. Imaged were obtained with the patient in a left lateral decubitus position. Local oropharyngeal anesthetic was provided with Cetacaine. Sedation performed by different physician. The patient was monitored while under deep sedation. Anesthestetic sedation was provided intravenously by Anesthesiology: '453mg'$  of Propofol, '100mg'$  of Lidocaine. Image quality was good. The patient's vital signs; including heart rate, blood pressure, and oxygen saturation; remained stable throughout the procedure. The patient developed no complications during the procedure. IMPRESSIONS  1. Left ventricular ejection fraction, by estimation, is 45%. The left ventricle has mildly decreased function.  2. Right ventricular systolic function is mildly reduced. The right ventricular size is normal.  3. Left atrial size was mildly dilated. No left atrial/left atrial appendage thrombus was detected. The LAA emptying velocity was 26 cm/s.  4. Right atrial size was mildly dilated.  5. The mitral valve is grossly normal. Moderate-severe mitral valve regurgitation appears functional and commissural, from lack of complete coaption of the leaflets. Systolic reversal seen in the RUPV, otherwise systolic blunting noted. No evidence of mitral stenosis.  6. Tricuspid valve regurgitation is moderate.  7. The aortic valve  is tricuspid. There is mild calcification of the aortic valve. Aortic valve regurgitation is moderate. Vena contracta 0.5 cm.  8. There is Severe (Grade IV) atheroma plaque involving the aortic arch and descending aorta.  9. Agitated saline contrast bubble study was negative, with no evidence of any interatrial shunt. Conclusion(s)/Recommendation(s): No evidence of vegetation/infective endocarditis on this transesophageael echocardiogram. FINDINGS  Left Ventricle: Left ventricular ejection fraction, by estimation, is 45%. The left ventricle has mildly decreased function. The left ventricular internal cavity size was normal in size. Right Ventricle: The right ventricular size is normal. No increase in right ventricular  wall thickness. Right ventricular systolic function is mildly reduced. Left Atrium: Left atrial size was mildly dilated. No left atrial/left atrial appendage thrombus was detected. The LAA emptying velocity was 26 cm/s. Right Atrium: Right atrial size was mildly dilated. Pericardium: There is no evidence of pericardial effusion. Mitral Valve: The mitral valve is grossly normal. Moderate to severe mitral valve regurgitation. No evidence of mitral valve stenosis. Tricuspid Valve: The tricuspid valve is grossly normal. Tricuspid valve regurgitation is moderate. Aortic Valve: The aortic valve is tricuspid. There is mild calcification of the aortic valve. Aortic valve regurgitation is moderate. Aortic regurgitation PHT measures 786 msec. No aortic stenosis is present. Pulmonic Valve: The pulmonic valve was not well visualized. Pulmonic valve regurgitation is trivial. Aorta: The aortic root and ascending aorta are structurally normal, with no evidence of dilitation. There is severe (Grade IV) atheroma plaque involving the aortic arch and descending aorta. IAS/Shunts: The interatrial septum appears to be lipomatous. No atrial level shunt detected by color flow Doppler. Agitated saline contrast was given  intravenously to evaluate for intracardiac shunting. Agitated saline contrast bubble study was negative,  with no evidence of any interatrial shunt.  LEFT VENTRICLE PLAX 2D LVOT diam:     2.10 cm LV SV:         42 LV SV Index:   17 LVOT Area:     3.46 cm  AORTIC VALVE LVOT Vmax:         66.70 cm/s LVOT Vmean:        44.000 cm/s LVOT VTI:          0.121 m AI PHT:            786 msec AR Vena Contracta: 0.50 cm  AORTA Ao Asc diam: 3.10 cm MR Peak grad: 69.9 mmHg MR Mean grad: 44.0 mmHg   SHUNTS MR Vmax:      418.00 cm/s Systemic VTI:  0.12 m MR Vmean:     311.0 cm/s  Systemic Diam: 2.10 cm Cherlynn Kaiser MD Electronically signed by Cherlynn Kaiser MD Signature Date/Time: 12/23/2021/4:41:41 PM    Final    DG ESOPHAGUS W SINGLE CM (SOL OR THIN BA)  Result Date: 12/22/2021 CLINICAL DATA:  Plan for TEE, history of stricture EXAM: ESOPHOGRAM/BARIUM SWALLOW TECHNIQUE: Single contrast examination was performed using  thin barium. FLUOROSCOPY: Radiation Exposure Index (as provided by the fluoroscopic device): 8.6 mGy [place] COMPARISON:  None Available. FINDINGS: Study was performed in a semi recumbent position. Patient swallowed barium without difficulty. There is no mass or stricture. Esophageal dysmotility is present with retention of contrast. No hiatal hernia. IMPRESSION: No stricture. Esophageal dysmotility. Electronically Signed   By: Macy Mis M.D.   On: 12/22/2021 09:23   MR THORACIC SPINE WO CONTRAST  Result Date: 12/20/2021 CLINICAL DATA:  Bacteremia with epidural abscess suspected EXAM: MRI THORACIC AND LUMBAR SPINE WITHOUT CONTRAST TECHNIQUE: Multiplanar and multiecho pulse sequences of the thoracic and lumbar spine were obtained without intravenous contrast. COMPARISON:  None Available. FINDINGS: MRI THORACIC SPINE FINDINGS Alignment:  Physiologic. Vertebrae: No fracture, evidence of discitis, or bone lesion. Cord:  Normal signal and morphology. Paraspinal and other soft tissues: Negative. Disc levels:  Small paracentral disc protrusions are seen at T3-4 to T8-9. Lower thoracic endplate and facet spurring as well. No high-grade spinal canal or foraminal stenosis. MRI LUMBAR SPINE FINDINGS Segmentation:  5 lumbar type vertebrae Alignment: Mild dextrocurvature. Grade 1 anterolisthesis at L4-5 and L5-S1 Vertebrae:  No fracture, evidence of discitis, or bone lesion.  Conus medullaris and cauda equina: Conus extends to the L1 level. Conus and cauda equina appear normal. Paraspinal and other soft tissues: Negative. Disc levels: T12- L1: Spondylosis and facet spurring L1-L2: Spondylosis and mild facet spurring L2-L3: Spondylosis with disc narrowing and bulging. Mild facet spurring L3-L4: Disc narrowing and left preferential bulging with left foraminal and extraforaminal herniation. Degenerative facet spurring asymmetric to the left. Left foraminal impingement. L4-L5: Degenerative facet spurring which is advanced with mild anterolisthesis. Mild disc bulging. Mild spinal and bilateral foraminal stenosis L5-S1:Advanced facet osteoarthritis with spurring and anterolisthesis. Mild disc bulging with moderate right foraminal narrowing. IMPRESSION: MR THORACIC SPINE IMPRESSION 1. No evidence of spinal infection. 2. Ordinary degenerative changes without high-grade canal or foraminal stenosis. MR LUMBAR SPINE IMPRESSION 1. No evidence of spinal infection. 2. Generalized degeneration with mild scoliosis and L4-5 anterolisthesis. 3. Foraminal narrowings greatest on the left at L3-4. Electronically Signed   By: Jorje Guild M.D.   On: 12/20/2021 05:29   MR LUMBAR SPINE WO CONTRAST  Result Date: 12/20/2021 CLINICAL DATA:  Bacteremia with epidural abscess suspected EXAM: MRI THORACIC AND LUMBAR SPINE WITHOUT CONTRAST TECHNIQUE: Multiplanar and multiecho pulse sequences of the thoracic and lumbar spine were obtained without intravenous contrast. COMPARISON:  None Available. FINDINGS: MRI THORACIC SPINE FINDINGS Alignment:   Physiologic. Vertebrae: No fracture, evidence of discitis, or bone lesion. Cord:  Normal signal and morphology. Paraspinal and other soft tissues: Negative. Disc levels: Small paracentral disc protrusions are seen at T3-4 to T8-9. Lower thoracic endplate and facet spurring as well. No high-grade spinal canal or foraminal stenosis. MRI LUMBAR SPINE FINDINGS Segmentation:  5 lumbar type vertebrae Alignment: Mild dextrocurvature. Grade 1 anterolisthesis at L4-5 and L5-S1 Vertebrae:  No fracture, evidence of discitis, or bone lesion. Conus medullaris and cauda equina: Conus extends to the L1 level. Conus and cauda equina appear normal. Paraspinal and other soft tissues: Negative. Disc levels: T12- L1: Spondylosis and facet spurring L1-L2: Spondylosis and mild facet spurring L2-L3: Spondylosis with disc narrowing and bulging. Mild facet spurring L3-L4: Disc narrowing and left preferential bulging with left foraminal and extraforaminal herniation. Degenerative facet spurring asymmetric to the left. Left foraminal impingement. L4-L5: Degenerative facet spurring which is advanced with mild anterolisthesis. Mild disc bulging. Mild spinal and bilateral foraminal stenosis L5-S1:Advanced facet osteoarthritis with spurring and anterolisthesis. Mild disc bulging with moderate right foraminal narrowing. IMPRESSION: MR THORACIC SPINE IMPRESSION 1. No evidence of spinal infection. 2. Ordinary degenerative changes without high-grade canal or foraminal stenosis. MR LUMBAR SPINE IMPRESSION 1. No evidence of spinal infection. 2. Generalized degeneration with mild scoliosis and L4-5 anterolisthesis. 3. Foraminal narrowings greatest on the left at L3-4. Electronically Signed   By: Jorje Guild M.D.   On: 12/20/2021 05:29   DG Skull 1-3 Views  Result Date: 12/20/2021 CLINICAL DATA:  MRI clearance. EXAM: SKULL - 1-3 VIEW COMPARISON:  09/12/2021. FINDINGS: There is no evidence of skull fracture or other focal bone lesions. No  radiopaque foreign body. IMPRESSION: No radiopaque foreign body. Electronically Signed   By: Brett Fairy M.D.   On: 12/20/2021 04:23   ECHOCARDIOGRAM COMPLETE  Result Date: 12/19/2021    ECHOCARDIOGRAM REPORT   Patient Name:   [name] Date of Exam: 12/19/2021 Medical Rec #:  [id number]    Height:       75.0 in Accession #:    [id number]   Weight:       268.5 lb Date of Birth:  [birth year]    BSA:  2.487 m Patient Age:    51 years     BP:           137/75 mmHg Patient Gender: M            HR:           93 bpm. Exam Location:  Inpatient Procedure: 2D Echo, Cardiac Doppler, Color Doppler and Intracardiac            Opacification Agent Indications:    Bacteremia R78.81  History:        Patient has prior history of Echocardiogram examinations, most                 recent 07/29/2021. CHF, CAD and Previous Myocardial Infarction,                 Arrythmias:Atrial Fibrillation; Risk Factors:Diabetes, GERD,                 Dyslipidemia and Hypertension.  Sonographer:    Bernadene Person RDCS Referring Phys: [id number] [place]  1. Left ventricular ejection fraction, by estimation, is 45 to 50%. The left ventricle has mildly decreased function. The left ventricle demonstrates global hypokinesis. The left ventricular internal cavity size was mildly dilated. Left ventricular diastolic parameters are indeterminate.  2. Right ventricular systolic function is normal. The right ventricular size is normal. There is normal pulmonary artery systolic pressure. The estimated right ventricular systolic pressure is 10.2 mmHg.  3. The mitral valve is normal in structure. Mild mitral valve regurgitation. No evidence of mitral stenosis.  4. The aortic valve is tricuspid. There is mild calcification of the aortic valve. Aortic valve regurgitation is trivial.  5. The inferior vena cava is normal in size with greater than 50% respiratory variability, suggesting right atrial pressure of 3 mmHg. FINDINGS  Left  Ventricle: Left ventricular ejection fraction, by estimation, is 45 to 50%. The left ventricle has mildly decreased function. The left ventricle demonstrates global hypokinesis. Definity contrast agent was given IV to delineate the left ventricular  endocardial borders. The left ventricular internal cavity size was mildly dilated. There is no left ventricular hypertrophy. Left ventricular diastolic parameters are indeterminate. Right Ventricle: The right ventricular size is normal. No increase in right ventricular wall thickness. Right ventricular systolic function is normal. There is normal pulmonary artery systolic pressure. The tricuspid regurgitant velocity is 1.86 m/s, and  with an assumed right atrial pressure of 3 mmHg, the estimated right ventricular systolic pressure is 72.5 mmHg. Left Atrium: Left atrial size was normal in size. Right Atrium: Right atrial size was normal in size. Pericardium: There is no evidence of pericardial effusion. Mitral Valve: The mitral valve is normal in structure. Mild mitral valve regurgitation. No evidence of mitral valve stenosis. Tricuspid Valve: The tricuspid valve is normal in structure. Tricuspid valve regurgitation is trivial. Aortic Valve: The aortic valve is tricuspid. There is mild calcification of the aortic valve. Aortic valve regurgitation is trivial. Pulmonic Valve: The pulmonic valve was normal in structure. Pulmonic valve regurgitation is not visualized. Aorta: The aortic root is normal in size and structure. Venous: The inferior vena cava is normal in size with greater than 50% respiratory variability, suggesting right atrial pressure of 3 mmHg. IAS/Shunts: No atrial level shunt detected by color flow Doppler.  LEFT VENTRICLE PLAX 2D LVIDd:         5.70 cm LVIDs:         4.70 cm LV PW:  1.10 cm LV IVS:        1.10 cm LVOT diam:     2.10 cm LV SV:         44 LV SV Index:   18 LVOT Area:     3.46 cm  LV Volumes (MOD) LV vol d, MOD A2C: 104.0 ml LV vol d,  MOD A4C: 150.0 ml LV vol s, MOD A2C: 50.8 ml LV vol s, MOD A4C: 81.8 ml LV SV MOD A2C:     53.2 ml LV SV MOD A4C:     150.0 ml LV SV MOD BP:      60.6 ml RIGHT VENTRICLE RV S prime:     12.00 cm/s TAPSE (M-mode): 2.0 cm LEFT ATRIUM             Index        RIGHT ATRIUM           Index LA diam:        4.50 cm 1.81 cm/m   RA Area:     16.90 cm LA Vol (A2C):   74.3 ml 29.87 ml/m  RA Volume:   41.30 ml  16.61 ml/m LA Vol (A4C):   53.1 ml 21.35 ml/m LA Biplane Vol: 62.9 ml 25.29 ml/m  AORTIC VALVE LVOT Vmax:   80.00 cm/s LVOT Vmean:  52.500 cm/s LVOT VTI:    0.127 m  AORTA Ao Root diam: 3.30 cm Ao Asc diam:  3.60 cm TRICUSPID VALVE TR Peak grad:   13.8 mmHg TR Vmax:        186.00 cm/s  SHUNTS Systemic VTI:  0.13 m Systemic Diam: 2.10 cm Dalton McleanMD Electronically signed by Franki Monte Signature Date/Time: 12/19/2021/4:15:20 PM    Final    VAS [place] LOWER EXTREMITY ARTERIAL DUPLEX  Result Date: 12/19/2021 LOWER EXTREMITY ARTERIAL DUPLEX STUDY Patient Name:  KOTARO BUER  Date of Exam:   12/19/2021 Medical Rec #: [id number]     Accession #:    [id number] Date of Birth: 04-May-1951     Patient Gender: M Patient Age:   67 years Exam Location:  [facility] Procedure:      VAS [place] LOWER EXTREMITY ARTERIAL DUPLEX Referring Phys: [facility] --------------------------------------------------------------------------------  Indications: Gangrene, and peripheral artery disease. High Risk Factors: Hypertension, Diabetes, past history of smoking, coronary                    artery disease.  Vascular Interventions: 04-17-19: Right SFA Stent                          11-22-20: Left CFA to AK Pop bypass graft w/ reversed                         ipsilateral GSV.                          08-14-2021 Redo Left Fem-Pop Bypass Graft. Current ABI:            RT 0.79/0.54 LT 0.81/0.57 Comparison Study: 09-22-2021 Lower extremity arterial bilateral and ABI studies                   performed. Performing Technologist: Darlin Coco  RDMS, RVT  Examination Guidelines: A complete evaluation includes B-mode imaging, spectral Doppler, color Doppler, and power Doppler as needed of all accessible portions of each vessel. Bilateral testing  is considered an integral part of a complete examination. Limited examinations for reoccurring indications may be performed as noted.  +----------+--------+-----+--------+-----------------------+--------+ RIGHT     PSV cm/sRatioStenosisWaveform               Comments +----------+--------+-----+--------+-----------------------+--------+ CFA Prox  140                  biphasic                        +----------+--------+-----+--------+-----------------------+--------+ CFA Mid   122                  biphasic                        +----------+--------+-----+--------+-----------------------+--------+ CFA Distal101                  biphasic                        +----------+--------+-----+--------+-----------------------+--------+ DFA       145                  biphasic                        +----------+--------+-----+--------+-----------------------+--------+ SFA Prox  143                  biphasic                        +----------+--------+-----+--------+-----------------------+--------+ POP Prox  89                   biphasic                        +----------+--------+-----+--------+-----------------------+--------+ POP Mid   48                   biphasic                        +----------+--------+-----+--------+-----------------------+--------+ POP Distal75                   biphasic                        +----------+--------+-----+--------+-----------------------+--------+ TP Trunk  72                   biphasic                        +----------+--------+-----+--------+-----------------------+--------+ PTA Distal55                   monophasic                      +----------+--------+-----+--------+-----------------------+--------+  DP        0                    Unable to obtain signal         +----------+--------+-----+--------+-----------------------+--------+  Right Stent(s): +---------------+--------+--------+--------+--------+ Mid SFA        PSV cm/sStenosisWaveformComments +---------------+--------+--------+--------+--------+ Prox to Stent  154             biphasic         +---------------+--------+--------+--------+--------+ Proximal Stent 166  biphasic         +---------------+--------+--------+--------+--------+ Mid Stent      80              biphasic         +---------------+--------+--------+--------+--------+ Distal Stent   36              biphasic         +---------------+--------+--------+--------+--------+ Distal to Stent117             biphasic         +---------------+--------+--------+--------+--------+    +----------+--------+-----+--------+-------------------+--------+ LEFT      PSV cm/sRatioStenosisWaveform           Comments +----------+--------+-----+--------+-------------------+--------+ CFA Prox  119                  triphasic                   +----------+--------+-----+--------+-------------------+--------+ CFA Mid   100                  triphasic                   +----------+--------+-----+--------+-------------------+--------+ CFA [id number]                  audibly triphasic           +----------+--------+-----+--------+-------------------+--------+ TP Trunk  167                  monophasic                  +----------+--------+-----+--------+-------------------+--------+ PTA Distal88                   monophasic                  +----------+--------+-----+--------+-------------------+--------+ DP        27                   dampened monophasic         +----------+--------+-----+--------+-------------------+--------+  Left Graft #1: Femoral-popliteal +--------------------+--------+--------+----------+---------+                      PSV cm/sStenosisWaveform  Comments  +--------------------+--------+--------+----------+---------+ Inflow              202             triphasic           +--------------------+--------+--------+----------+---------+ Proximal Anastomosis170             monophasicTurbulent +--------------------+--------+--------+----------+---------+ Proximal Graft      89              monophasic          +--------------------+--------+--------+----------+---------+ Mid Graft           47              monophasic          +--------------------+--------+--------+----------+---------+ Distal Graft        48              monophasic          +--------------------+--------+--------+----------+---------+ Distal Anastomosis  45              monophasic          +--------------------+--------+--------+----------+---------+ Outflow             116             monophasic          +--------------------+--------+--------+----------+---------+  Summary: Right: No significant change compared to previous study. Patent mid SFA stent. Left: No significant change as compared to previous study. Patent left femoral-popliteal bypass graft.  See table(s) above for measurements and observations. Electronically signed by Deitra Mayo MD on 12/19/2021 at 12:59:00 PM.    Final    VAS [place] ABI WITH/WO TBI  Result Date: 12/19/2021  LOWER EXTREMITY DOPPLER STUDY Patient Name:  LAVANTE TOSO  Date of Exam:   12/19/2021 Medical Rec #: [id number]     Accession #:    [id number] Date of Birth: 1951/04/09     Patient Gender: M Patient Age:   32 years Exam Location:  [facility] Procedure:      VAS [place] ABI WITH/WO TBI Referring Phys: [facility] --------------------------------------------------------------------------------  Indications: Gangrene, and peripheral artery disease. High Risk Factors: Hypertension, Diabetes, past history of smoking, coronary                    artery disease.   Comparison Study: 09-22-2021 Prior ABI was 0.84/0.0 on the right and 0.61/NA on                   the left. Performing Technologist: Darlin Coco RDMS RVT  Examination Guidelines: A complete evaluation includes at minimum, Doppler waveform signals and systolic blood pressure reading at the level of bilateral brachial, anterior tibial, and posterior tibial arteries, when vessel segments are accessible. Bilateral testing is considered an integral part of a complete examination. Photoelectric Plethysmograph (PPG) waveforms and toe systolic pressure readings are included as required and additional duplex testing as needed. Limited examinations for reoccurring indications may be performed as noted.  ABI Findings: +---------+------------------+-----+-------------------+--------+ Right    Rt Pressure (mmHg)IndexWaveform           Comment  +---------+------------------+-----+-------------------+--------+ Brachial 129                    triphasic                   +---------+------------------+-----+-------------------+--------+ PTA      108               0.79 monophasic                  +---------+------------------+-----+-------------------+--------+ DP       86                0.63 dampened monophasic         +---------+------------------+-----+-------------------+--------+ Great Toe74                0.54 Abnormal                    +---------+------------------+-----+-------------------+--------+ +---------+------------------+-----+-------------------+-------+ Left     Lt Pressure (mmHg)IndexWaveform           Comment +---------+------------------+-----+-------------------+-------+ Brachial 136                    triphasic                  +---------+------------------+-----+-------------------+-------+ PTA      80                0.59 monophasic                 +---------+------------------+-----+-------------------+-------+ DP       110               0.81 dampened  monophasic        +---------+------------------+-----+-------------------+-------+  Great Toe78                0.57 Abnormal                   +---------+------------------+-----+-------------------+-------+ +-------+-----------+-----------+------------+------------+ ABI/TBIToday's ABIToday's TBIPrevious ABIPrevious TBI +-------+-----------+-----------+------------+------------+ Right  0.79       0.54       0.84        0.0          +-------+-----------+-----------+------------+------------+ Left   0.81       0.57       0.61        Not obtained +-------+-----------+-----------+------------+------------+ Arterial wall calcification precludes accurate ankle pressures and ABIs.  Summary: Right: Resting right ankle-brachial index indicates moderate right lower extremity arterial disease; however, arterial calcification precludes accurate ABIs. The right toe-brachial index is abnormal. Left: Resting left ankle-brachial index indicates mild left lower extremity arterial disease; however, arterial calcification precludes accurate ABIs. The left toe-brachial index is abnormal. *See table(s) above for measurements and observations.  Electronically signed by Deitra Mayo MD on 12/19/2021 at 12:58:12 PM.    Final    DG Foot 2 Views Left  Result Date: 12/18/2021 CLINICAL DATA:  Pain and ulcers in bilateral feet. EXAM: LEFT FOOT - 2 VIEW COMPARISON:  Left foot radiographs 07/25/2021 FINDINGS: Moderate hallux valgus. Mild lateral greater than medial great toe peripheral degenerative osteophytes. Moderate great toe metatarsal head-sesamoid joint space narrowing and peripheral osteophytosis degenerative change. Tiny plantar calcaneal heel spur. Mild chronic enthesopathic change at the Achilles insertion on the calcaneus. Old healed fractures of the distal shafts of the second and third metatarsals are unchanged. Chronic enthesopathic change at the peroneus brevis tendon insertion of the base of fifth  metatarsal. Mild vascular calcifications. No definite soft tissue ulcer is identified. No cortical erosion. IMPRESSION: 1. Moderate hallux valgus with mild great toe metatarsophalangeal osteoarthritis. 2. No definite radiographic evidence of a soft tissue ulcer. 3. No cortical erosion to indicate radiographic evidence of acute osteomyelitis. Electronically Signed   By: Yvonne Kendall M.D.   On: 12/18/2021 17:13   DG Foot 2 Views Right  Result Date: 12/18/2021 CLINICAL DATA:  Pain.  Ulcers in bilateral feet. EXAM: RIGHT FOOT - 2 VIEW COMPARISON:  Right foot radiographs 07/25/2021 FINDINGS: Minimal lateral great toe metatarsophalangeal degenerative osteophytosis. Moderate hallux valgus. Mild plantar calcaneal heel spur. Moderate enthesopathic spurring at the Achilles insertion on the calcaneus. A bandage overlies the hindfoot. No definite radiographic evidence of soft tissue ulceration is seen. No cortical erosion is seen. IMPRESSION: No cortical erosion is seen to indicate radiographic evidence of acute osteomyelitis. Moderate hallux valgus. Moderate enthesopathic spurring at the Achilles insertion on the calcaneus. Electronically Signed   By: Yvonne Kendall M.D.   On: 12/18/2021 17:11   CT CHEST ABDOMEN PELVIS W CONTRAST  Result Date: 12/17/2021 CLINICAL DATA:  Sepsis. EXAM: CT CHEST, ABDOMEN, AND PELVIS WITH CONTRAST TECHNIQUE: Multidetector CT imaging of the chest, abdomen and pelvis was performed following the standard protocol during bolus administration of intravenous contrast. RADIATION DOSE REDUCTION: This exam was performed according to the departmental dose-optimization program which includes automated exposure control, adjustment of the mA and/or kV according to patient size and/or use of iterative reconstruction technique. CONTRAST:  152m OMNIPAQUE IOHEXOL 300 MG/ML  SOLN COMPARISON:  June 21, 2018. FINDINGS: CT CHEST FINDINGS Cardiovascular: Atherosclerosis of thoracic aorta is noted without  aneurysm or dissection. Normal cardiac size. No pericardial effusion. Extensive coronary artery calcifications are noted. Mediastinum/Nodes: No enlarged mediastinal,  hilar, or axillary lymph nodes. Thyroid gland, trachea, and esophagus demonstrate no significant findings. Lungs/Pleura: No pneumothorax or pleural effusion is noted. Right lung is clear. Minimal mosaic pattern is noted in the left lung which may represent air trapping secondary to small airways disease or possibly mild multifocal inflammation. Musculoskeletal: No chest wall mass or suspicious bone lesions identified. CT ABDOMEN PELVIS FINDINGS Hepatobiliary: No focal liver abnormality is seen. No gallstones, gallbladder wall thickening, or biliary dilatation. Pancreas: Unremarkable. No pancreatic ductal dilatation or surrounding inflammatory changes. Spleen: Normal in size without focal abnormality. Adrenals/Urinary Tract: Adrenal glands are unremarkable. Kidneys are normal, without renal calculi, focal lesion, or hydronephrosis. Bladder is unremarkable. Stomach/Bowel: Stomach is within normal limits. Appendix appears normal. No evidence of bowel wall thickening, distention, or inflammatory changes. Vascular/Lymphatic: Aortic atherosclerosis. No enlarged abdominal or pelvic lymph nodes. Reproductive: Prostate is unremarkable. Other: Small fat containing right inguinal hernia is noted. No ascites is noted. Musculoskeletal: No acute or significant osseous findings. IMPRESSION: Minimal mosaic pattern is noted in the left lung which may represent air trapping secondary to small airways disease or possibly mild multifocal inflammation. Small fat containing right inguinal hernia is noted. No other acute abnormality seen in the chest, abdomen or pelvis. Aortic Atherosclerosis (ICD10-I70.0). Electronically Signed   By: Marijo Conception M.D.   On: 12/17/2021 15:37   DG Chest Port 1 View  Result Date: 12/17/2021 CLINICAL DATA:  Uncontrolled atrial  fibrillation.  Possible sepsis. EXAM: PORTABLE CHEST 1 VIEW COMPARISON:  08/16/2021 FINDINGS: Cardiomegaly, vascular congestion. Left basilar opacity. Right lung clear. No effusions or acute bony abnormality. IMPRESSION: Cardiomegaly, vascular congestion. Left basilar atelectasis or infiltrate. Electronically Signed   By: Rolm Baptise M.D.   On: 12/17/2021 14:43    I spent 50 minutes for this patient encounter including review of prior medical records, coordination of care with primary/other specialist with greater than 50% of time being face to face/counseling and discussing diagnostics/treatment plan with the patient/family.  Electronically signed by:   Rosiland Oz, MD Infectious Disease Physician [facility] Pager: [phone]

## 2021-12-23 NOTE — Anesthesia Preprocedure Evaluation (Signed)
Anesthesia Evaluation  Patient identified by MRN, date of birth, ID band Patient awake    Reviewed: Allergy & Precautions, NPO status , Patient's Chart, lab work & pertinent test results  History of Anesthesia Complications Negative for: history of anesthetic complications  Airway Mallampati: II  TM Distance: >3 FB Neck ROM: Full    Dental  (+) Edentulous Upper, Edentulous Lower   Pulmonary former smoker,    breath sounds clear to auscultation       Cardiovascular hypertension, Pt. on medications and Pt. on home beta blockers + CAD, + Past MI, + Cardiac Stents and + Peripheral Vascular Disease  + dysrhythmias Atrial Fibrillation  Rhythm:Irregular Rate:Normal  12/19/2021 ECHO: EF 45 to 50%. The left ventricle has mildly decreased function. The left ventricle  demonstrates global hypokinesis. The left ventricular internal cavity size was mildly dilated. Normal RVF, mild MR   Neuro/Psych  Headaches, Anxiety Depression    GI/Hepatic GERD  Medicated,  Endo/Other  diabetes (glu 187), Insulin Dependent  Renal/GU Renal InsufficiencyRenal disease     Musculoskeletal   Abdominal (+) + obese,   Peds  Hematology eliquis   Anesthesia Other Findings   Reproductive/Obstetrics                             Anesthesia Physical Anesthesia Plan  ASA: 3  Anesthesia Plan: MAC   Post-op Pain Management: Minimal or no pain anticipated   Induction:   PONV Risk Score and Plan: 1 and Treatment may vary due to age or medical condition  Airway Management Planned: Natural Airway and Nasal Cannula  Additional Equipment: None  Intra-op Plan:   Post-operative Plan:   Informed Consent: I have reviewed the patients History and Physical, chart, labs and discussed the procedure including the risks, benefits and alternatives for the proposed anesthesia with the patient or authorized representative who has indicated  his/her understanding and acceptance.       Plan Discussed with: CRNA and Surgeon  Anesthesia Plan Comments:         Anesthesia Quick Evaluation

## 2021-12-23 NOTE — Care Management Important Message (Signed)
Important Message  Patient Details  Name: [name] [name] MRN: [id number] Date of Birth: Aug 04, 1950   Medicare Important Message Given:  Yes     [name] 12/23/2021, 10:01 AM

## 2021-12-24 ENCOUNTER — Other Ambulatory Visit: Payer: Self-pay | Admitting: *Deleted

## 2021-12-24 ENCOUNTER — Encounter (HOSPITAL_COMMUNITY): Payer: Self-pay | Admitting: Internal Medicine

## 2021-12-24 LAB — CULTURE, BLOOD (ROUTINE X 2): Culture: NO GROWTH

## 2021-12-24 NOTE — Patient Outreach (Signed)
  Care Coordination [facility] Note Transition Care Management Follow-up Telephone Call Date of discharge and from where: 12/23/21 [facility] How have you been since you were released from the hospital? Patient states he is feeling good Any questions or concerns? No  Items Reviewed: Did the pt receive and understand the discharge instructions provided? Yes  Medications obtained and verified? Yes  Other? No  Any new allergies since your discharge? No  Dietary orders reviewed? Yes Do you have support at home? Yes   Home Care and Equipment/Supplies: Were home health services ordered? no If so, what is the name of the agency? N/A  Has the agency set up a time to come to the patient's home? not applicable Were any new equipment or medical supplies ordered?  No What is the name of the medical supply agency? N/A Were you able to get the supplies/equipment? not applicable Do you have any questions related to the use of the equipment or supplies? No  Functional Questionnaire: (I = Independent and D = Dependent) ADLs: I  Bathing/Dressing- I  Meal Prep- I  Eating- I  Maintaining continence- I  Transferring/Ambulation- I  Managing Meds- I  Follow up appointments reviewed:  PCP Hospital f/u appt confirmed? No   Specialist Hospital f/u appt confirmed? Yes  Scheduled to see Dr. West Bali on 01/15/22 @ 0900. Are transportation arrangements needed? No  If their condition worsens, is the pt aware to call PCP or go to the Emergency Dept.? Yes Was the patient provided with contact information for the PCP's office or ED? Yes Was to pt encouraged to call back with questions or concerns? Yes  SDOH assessments and interventions completed:   Yes  Care Coordination Interventions Activated:  No   Care Coordination Interventions:   N/A     Encounter Outcome:  Pt. Visit Completed    Emelia Loron RN, BSN [place] [phone] [email or web address]

## 2021-12-25 DIAGNOSIS — I70702 Unspecified atherosclerosis of other type of bypass graft(s) of the extremities, left leg: Secondary | ICD-10-CM | POA: Diagnosis not present

## 2021-12-25 DIAGNOSIS — L97522 Non-pressure chronic ulcer of other part of left foot with fat layer exposed: Secondary | ICD-10-CM | POA: Diagnosis not present

## 2021-12-25 DIAGNOSIS — I739 Peripheral vascular disease, unspecified: Secondary | ICD-10-CM | POA: Diagnosis not present

## 2021-12-25 DIAGNOSIS — I70203 Unspecified atherosclerosis of native arteries of extremities, bilateral legs: Secondary | ICD-10-CM | POA: Diagnosis not present

## 2021-12-25 DIAGNOSIS — I70229 Atherosclerosis of native arteries of extremities with rest pain, unspecified extremity: Secondary | ICD-10-CM | POA: Diagnosis not present

## 2021-12-25 DIAGNOSIS — E1140 Type 2 diabetes mellitus with diabetic neuropathy, unspecified: Secondary | ICD-10-CM | POA: Diagnosis not present

## 2021-12-25 DIAGNOSIS — L97911 Non-pressure chronic ulcer of unspecified part of right lower leg limited to breakdown of skin: Secondary | ICD-10-CM | POA: Diagnosis not present

## 2021-12-25 DIAGNOSIS — Z48812 Encounter for surgical aftercare following surgery on the circulatory system: Secondary | ICD-10-CM | POA: Diagnosis not present

## 2021-12-26 ENCOUNTER — Ambulatory Visit (INDEPENDENT_AMBULATORY_CARE_PROVIDER_SITE_OTHER): Payer: Medicare Other | Admitting: Family

## 2021-12-26 ENCOUNTER — Encounter: Payer: Self-pay | Admitting: Family

## 2021-12-26 VITALS — BP 108/59 | HR 91 | Temp 97.4°F | Ht 74.0 in | Wt 281.2 lb

## 2021-12-26 DIAGNOSIS — A419 Sepsis, unspecified organism: Secondary | ICD-10-CM | POA: Diagnosis not present

## 2021-12-26 DIAGNOSIS — I152 Hypertension secondary to endocrine disorders: Secondary | ICD-10-CM

## 2021-12-26 DIAGNOSIS — Z794 Long term (current) use of insulin: Secondary | ICD-10-CM | POA: Diagnosis not present

## 2021-12-26 DIAGNOSIS — E1159 Type 2 diabetes mellitus with other circulatory complications: Secondary | ICD-10-CM

## 2021-12-26 DIAGNOSIS — I70229 Atherosclerosis of native arteries of extremities with rest pain, unspecified extremity: Secondary | ICD-10-CM

## 2021-12-26 DIAGNOSIS — R7881 Bacteremia: Secondary | ICD-10-CM

## 2021-12-26 DIAGNOSIS — E1142 Type 2 diabetes mellitus with diabetic polyneuropathy: Secondary | ICD-10-CM

## 2021-12-26 DIAGNOSIS — I509 Heart failure, unspecified: Secondary | ICD-10-CM | POA: Diagnosis not present

## 2021-12-26 NOTE — Progress Notes (Signed)
Subjective:    Patient ID: [name] [name], male    DOB: 23-Mar-1951, 71 y.o.   MRN: [id number]  Chief Complaint  Patient presents with   Hospitalization Follow-up   Pt presents to the office today for hospital follow up. He went to the ED on 12/17/21 and was diagnosed with sepsis POA due to bacteremia. He was discharged on 14 days of Amoxicillin (12/19/21 being day 1). Discharge on 12/23/21.He has follow up with ID, Cardiologists, and Vascular Surgery.    He has DM, hx of NSTEMI, CAD, CHF and A Fib.   His last A1C was 7.5.  Diabetes He presents for his follow-up diabetic visit. He has type 2 diabetes mellitus. Associated symptoms include blurred vision and foot paresthesias. Symptoms are worsening. Diabetic complications include nephropathy and peripheral neuropathy. Risk factors for coronary artery disease include dyslipidemia, diabetes mellitus, hypertension, male sex and sedentary lifestyle. He is following a generally healthy diet.      Review of Systems  Eyes:  Positive for blurred vision.  All other systems reviewed and are negative.  Family History  Problem Relation Age of Onset   Diabetes Father    Valvular heart disease Father    Arthritis Father    Heart disease Father    Stroke Father    Alzheimer's disease Mother    Hyperlipidemia Mother    Hypertension Mother    Arthritis Mother    Lung cancer Mother    Stroke Mother    Headache Mother    Arthritis/Rheumatoid Sister    Diabetes Sister    Hypertension Sister    Hyperlipidemia Sister    Depression Sister    Dementia Maternal Aunt    Dementia Maternal Uncle    Heart disease Maternal Uncle    Stomach cancer Paternal Uncle    Colon cancer Neg Hx    Liver disease Neg Hx    Social History   Socioeconomic History   Marital status: Married    Spouse name: Natalia Leatherwood   Number of children: 2   Years of education: 12   Highest education level: 12th grade  Occupational History   Occupation: retired  Tobacco Use    Smoking status: Former    Packs/day: 0.25    Years: 51.00    Total pack years: 12.75    Types: Cigarettes    Quit date: 08/14/2016    Years since quitting: 5.3   Smokeless tobacco: Never   Tobacco comments:    smokes  a pack a week  Vaping Use   Vaping Use: Never used  Substance and Sexual Activity   Alcohol use: No    Alcohol/week: 0.0 standard drinks of alcohol   Drug use: No   Sexual activity: Yes  Other Topics Concern   Not on file  Social History Narrative   Lives with his wife.  Retired.  Unable to afford expensive medicines.     He has 2 children from previous marriage - they live in [place]       Caffeine: 1 cup of 1/2 caff coffee in AM, drinks more if at a restaurant    Social Determinants of Health   Financial Resource Strain: Medium Risk (01/06/2021)   Overall Financial Resource Strain (CARDIA)    Difficulty of Paying Living Expenses: Somewhat hard  Food Insecurity: No Food Insecurity (01/06/2021)   Hunger Vital Sign    Worried About Running Out of Food in the Last Year: Never true    Ran Out of Food in  the Last Year: Never true  Transportation Needs: No Transportation Needs (01/06/2021)   PRAPARE - Hydrologist (Medical): No    Lack of Transportation (Non-Medical): No  Physical Activity: Insufficiently Active (01/06/2021)   Exercise Vital Sign    Days of Exercise per Week: 7 days    Minutes of Exercise per Session: 10 min  Stress: No Stress Concern Present (01/06/2021)   [place]    Feeling of Stress : Only a little  Social Connections: Moderately Isolated (01/06/2021)   Social Connection and Isolation Panel [NHANES]    Frequency of Communication with Friends and Family: More than three times a week    Frequency of Social Gatherings with Friends and Family: Twice a week    Attends Religious Services: Never    Marine scientist or Organizations: No     Attends Archivist Meetings: Never    Marital Status: Married        Objective:   Physical Exam Vitals reviewed.  Constitutional:      General: He is not in acute distress.    Appearance: He is well-developed. He is obese.  HENT:     Head: Normocephalic.     Right Ear: Tympanic membrane normal.     Left Ear: Tympanic membrane normal.  Eyes:     General:        Right eye: No discharge.        Left eye: No discharge.     Pupils: Pupils are equal, round, and reactive to light.  Neck:     Thyroid: No thyromegaly.  Cardiovascular:     Rate and Rhythm: Normal rate and regular rhythm.     Heart sounds: Normal heart sounds. No murmur heard. Pulmonary:     Effort: Pulmonary effort is normal. No respiratory distress.     Breath sounds: Normal breath sounds. No wheezing.  Abdominal:     General: Bowel sounds are normal. There is no distension.     Palpations: Abdomen is soft.     Tenderness: There is no abdominal tenderness.  Musculoskeletal:        General: No tenderness. Normal range of motion.     Cervical back: Normal range of motion and neck supple.     Right lower leg: Edema (trace) present.     Left lower leg: Edema (trace) present.  Skin:    General: Skin is warm and dry.     Findings: No erythema or rash.     Comments: Dressing intact   Neurological:     Mental Status: He is alert and oriented to person, place, and time.     Cranial Nerves: No cranial nerve deficit.     Deep Tendon Reflexes: Reflexes are normal and symmetric.  Psychiatric:        Behavior: Behavior normal.        Thought Content: Thought content normal.        Judgment: Judgment normal.       Blood pressure (!) 108/59, pulse 91, temperature (!) 97.4 F (36.3 C), temperature source Temporal, height $RemoveBeforeDE'6\' 2"'yfAIyvPglhpfmrs$  (1.88 m), weight 281 lb 3.2 oz (127.6 kg).     Assessment & Plan:  SUHAS ESTIS comes in today with chief complaint of Hospitalization Follow-up   Diagnosis and orders  addressed:  1. Congestive heart failure, unspecified HF chronicity, unspecified heart failure type ([place]) - CMP14+EGFR - CBC with Differential/Platelet  2. Critical  lower limb ischemia (HCC) - CMP14+EGFR - CBC with Differential/Platelet  3. Hypertension associated with diabetes ([place]) - CMP14+EGFR - CBC with Differential/Platelet  4. Type 2 diabetes mellitus with diabetic polyneuropathy, with long-term current use of insulin (HCC) - CMP14+EGFR - CBC with Differential/Platelet  5. Bacteremia -Resolved - CMP14+EGFR - CBC with Differential/Platelet  6. Morbid obesity ([place]) - CMP14+EGFR - CBC with Differential/Platelet  7. Sepsis, due to unspecified organism, unspecified whether acute organ dysfunction present ([place]) -Resolved - CMP14+EGFR - CBC with Differential/Platelet   Labs pending Health Maintenance reviewed Diet and exercise encouraged  Follow up plan: 3 months   [name], FNP

## 2021-12-26 NOTE — Patient Instructions (Signed)

## 2021-12-27 LAB — CMP14+EGFR
ALT: 13 IU/L (ref 0–44)
AST: 15 IU/L (ref 0–40)
Albumin/Globulin Ratio: 1.4 (ref 1.2–2.2)
Albumin: 4.4 g/dL (ref 3.8–4.8)
Alkaline Phosphatase: 77 IU/L (ref 44–121)
BUN/Creatinine Ratio: 13 (ref 10–24)
BUN: 20 mg/dL (ref 8–27)
Bilirubin Total: 0.5 mg/dL (ref 0.0–1.2)
CO2: 21 mmol/L (ref 20–29)
Calcium: 9.6 mg/dL (ref 8.6–10.2)
Chloride: 99 mmol/L (ref 96–106)
Creatinine, Ser: 1.56 mg/dL — ABNORMAL HIGH (ref 0.76–1.27)
Globulin, Total: 3.2 g/dL (ref 1.5–4.5)
Glucose: 104 mg/dL — ABNORMAL HIGH (ref 70–99)
Potassium: 5 mmol/L (ref 3.5–5.2)
Sodium: 140 mmol/L (ref 134–144)
Total Protein: 7.6 g/dL (ref 6.0–8.5)
eGFR: 47 mL/min/{1.73_m2} — ABNORMAL LOW (ref >59)

## 2021-12-27 LAB — CBC WITH DIFFERENTIAL/PLATELET
Basophils Absolute: 0.1 10*3/uL (ref 0.0–0.2)
Basos: 1 %
EOS (ABSOLUTE): 0.2 10*3/uL (ref 0.0–0.4)
Eos: 2 %
Hematocrit: 36.2 % — ABNORMAL LOW (ref 37.5–51.0)
Hemoglobin: 10.7 g/dL — ABNORMAL LOW (ref 13.0–17.7)
Immature Grans (Abs): 0.3 10*3/uL — ABNORMAL HIGH (ref 0.0–0.1)
Immature Granulocytes: 3 %
Lymphocytes Absolute: 1.9 10*3/uL (ref 0.7–3.1)
Lymphs: 21 %
MCH: 22.7 pg — ABNORMAL LOW (ref 26.6–33.0)
MCHC: 29.6 g/dL — ABNORMAL LOW (ref 31.5–35.7)
MCV: 77 fL — ABNORMAL LOW (ref 79–97)
Monocytes Absolute: 1 10*3/uL — ABNORMAL HIGH (ref 0.1–0.9)
Monocytes: 11 %
Neutrophils Absolute: 5.7 10*3/uL (ref 1.4–7.0)
Neutrophils: 62 %
Platelets: 368 10*3/uL (ref 150–450)
RBC: 4.71 x10E6/uL (ref 4.14–5.80)
RDW: 15.1 % (ref 11.6–15.4)
WBC: 9.1 10*3/uL (ref 3.4–10.8)

## 2021-12-30 ENCOUNTER — Telehealth: Payer: Self-pay | Admitting: Family

## 2021-12-30 NOTE — Telephone Encounter (Signed)
Patient aware and verbalized understanding. °

## 2021-12-31 ENCOUNTER — Other Ambulatory Visit: Payer: Self-pay | Admitting: Family

## 2021-12-31 DIAGNOSIS — I480 Paroxysmal atrial fibrillation: Secondary | ICD-10-CM

## 2021-12-31 LAB — HGB A1C W/O EAG: Hgb A1c MFr Bld: 7.8 % — ABNORMAL HIGH (ref 4.8–5.6)

## 2021-12-31 LAB — SPECIMEN STATUS REPORT

## 2022-01-01 ENCOUNTER — Telehealth: Payer: Self-pay

## 2022-01-01 ENCOUNTER — Telehealth: Payer: Self-pay | Admitting: Cardiovascular Disease

## 2022-01-01 DIAGNOSIS — S81801D Unspecified open wound, right lower leg, subsequent encounter: Secondary | ICD-10-CM | POA: Diagnosis not present

## 2022-01-01 DIAGNOSIS — S81802D Unspecified open wound, left lower leg, subsequent encounter: Secondary | ICD-10-CM | POA: Diagnosis not present

## 2022-01-01 DIAGNOSIS — I50813 Acute on chronic right heart failure: Secondary | ICD-10-CM | POA: Diagnosis not present

## 2022-01-01 DIAGNOSIS — E11622 Type 2 diabetes mellitus with other skin ulcer: Secondary | ICD-10-CM | POA: Diagnosis not present

## 2022-01-01 DIAGNOSIS — E1140 Type 2 diabetes mellitus with diabetic neuropathy, unspecified: Secondary | ICD-10-CM | POA: Diagnosis not present

## 2022-01-01 DIAGNOSIS — I739 Peripheral vascular disease, unspecified: Secondary | ICD-10-CM | POA: Diagnosis not present

## 2022-01-01 DIAGNOSIS — I70244 Atherosclerosis of native arteries of left leg with ulceration of heel and midfoot: Secondary | ICD-10-CM | POA: Diagnosis not present

## 2022-01-01 DIAGNOSIS — L97911 Non-pressure chronic ulcer of unspecified part of right lower leg limited to breakdown of skin: Secondary | ICD-10-CM | POA: Diagnosis not present

## 2022-01-01 DIAGNOSIS — L97522 Non-pressure chronic ulcer of other part of left foot with fat layer exposed: Secondary | ICD-10-CM | POA: Diagnosis not present

## 2022-01-01 NOTE — Telephone Encounter (Signed)
Pt's wife, [name], called stating that the pt was d/c'd on 8/8 and wanted to know if he needed an earlier f/u appt.  Reviewed pt's chart, returned call for clarification, two identifiers used. Pt stated that vascular checked on him while he was in the hospital. There was no indication upon d/c that he needed to be seen any earlier. He states that his wounds are not worsening and the [facility] RN is taking care of him weekly. Instructed him to call if he had any worsening symptoms and keep his 03/09/22 appt. Confirmed understanding.

## 2022-01-01 NOTE — Telephone Encounter (Signed)
Wife called stating patient was released from the hospital on 8/8.  Patient has a hospital f/u appointment scheduled on 10/20 and wants to be seen only in [place]. Patient would like sooner appointment.

## 2022-01-02 ENCOUNTER — Telehealth: Payer: Self-pay | Admitting: *Deleted

## 2022-01-02 NOTE — Telephone Encounter (Signed)
FYI: TC from [place] w/ [facility] Saw pt yesterday, recert for next 8 wks from recent hospitalization for sepsis. BS in 200s, AFib w/ flutter. Having L leg pain, warm but she did feel pedal pulses, did have a dizzy spell w/ no other complications maybe d/t recent medical events, BS and all.

## 2022-01-05 ENCOUNTER — Other Ambulatory Visit: Payer: Self-pay | Admitting: Family

## 2022-01-07 ENCOUNTER — Ambulatory Visit (INDEPENDENT_AMBULATORY_CARE_PROVIDER_SITE_OTHER): Payer: Medicare Other

## 2022-01-07 ENCOUNTER — Ambulatory Visit: Payer: Medicare Other

## 2022-01-07 DIAGNOSIS — Z0000 Encounter for general adult medical examination without abnormal findings: Secondary | ICD-10-CM

## 2022-01-07 NOTE — Progress Notes (Signed)
MEDICARE ANNUAL WELLNESS VISIT  01/07/2022  Telephone Visit Disclaimer This Medicare AWV was conducted by telephone due to national recommendations for restrictions regarding the COVID-19 Pandemic (e.g. social distancing).  I verified, using two identifiers, that I am speaking with [name] or their authorized healthcare agent. I discussed the limitations, risks, security, and privacy concerns of performing an evaluation and management service by telephone and the potential availability of an in-person appointment in the future. The patient expressed understanding and agreed to proceed.  Location of Patient: Home Location of Provider (nurse):  [facility]  Subjective:    GREGOIRE BENNIS is a 71 y.o. male patient of Hawks, Theador Hawthorne, FNP who had a [employer] Visit today via telephone. Hendryx is Retired and lives with their spouse.  He reports that he is socially active and does interact with friends/family regularly. He is minimally physically active and enjoys fishing and working in his yard.  Patient Care Team: [name], FNP as PCP - General (Family Medicine) [name], MD as PCP - Electrophysiology (Cardiology) Josue Hector, MD as PCP - Cardiology (Cardiology) [name], MD as Consulting Physician (Vascular Surgery) [name] Cristopher Estimable, MD as Consulting Physician (Gastroenterology) Melvenia Beam, MD as Consulting Physician (Neurology) Lavera Guise, [facility] (Pharmacist) Dorene Ar, MD (Pain Medicine) Liana Gerold, MD as Consulting Physician (Nephrology) Celestia Khat, [place] (Optometry) [place], Rowan Blase, PTA as Physical Therapy Assistant (Orthopedic Surgery)     01/07/2022   10:30 AM 12/23/2021    1:12 PM 12/20/2021    2:00 PM 12/17/2021    1:21 PM 08/15/2021    9:50 AM 08/06/2021    9:42 AM 07/25/2021    1:14 PM  Advanced Directives  Does Patient Have a Medical Advance Directive? Yes Yes No Unable to assess, patient is non-responsive or  altered mental status Yes Yes Yes  Type of Advance Directive Living will Living will   [place];Living will Living will;Healthcare Power of Attorney Living will  Does patient want to make changes to medical advance directive?     No - Patient declined  No - Patient declined  Copy of [place] in Chart?     No - copy requested    Would patient like information on creating a medical advance directive? No - Patient declined  No - Patient declined        Hospital Utilization Over the Past 12 Months: # of hospitalizations or ER visits: 2 # of surgeries: 0  Review of Systems    Patient reports that his overall health is worse compared to last year.  History obtained from chart review and the patient  Patient Reported Readings (BP, Pulse, CBG, Weight, etc) none  Pain Assessment Pain : 0-10 Pain Score: 3  Pain Type: Chronic pain Pain Location: Foot Pain Orientation: Left Pain Radiating Towards: toes Pain Descriptors / Indicators: Dull, Shooting Pain Onset: More than a month ago Pain Frequency: Intermittent     Current Medications & Allergies (verified) Allergies as of 01/07/2022       Reactions   Shellfish Allergy Anaphylaxis, Other (See Comments)   Tongue swelling, hives   Sulfa Antibiotics Anaphylaxis, Rash   Tongue swelling, hives   Ace Inhibitors Other (See Comments), Cough   CKD, renal failure    Escitalopram Other (See Comments)   Buzzing in ears,headache, felt like a zombie   Evolocumab Other (See Comments)   Myalgias, flu like sx  Fenofibrate Other (See Comments)   Body aches - pt currently taking isnt sure if its causing any pain   Invokana [canagliflozin] Other (See Comments)   Syncope / dehydration   Lisinopril Cough   Metformin And Related Itching   Pravastatin Sodium Other (See Comments)   myalgias   Crestor [rosuvastatin] Other (See Comments)   Myalgias   Horse-derived Products Rash   horse serum   Lexapro  [escitalopram Oxalate] Other (See Comments)   Buzzing in ears,headache, felt like a zombie   Lipitor [atorvastatin] Other (See Comments)   myalgias   Livalo [pitavastatin] Other (See Comments)   Myalgias   Milk (cow) Nausea Only   Ties stomach in knots    Tape Rash        Medication List        Accurate as of January 07, 2022 10:39 AM. If you have any questions, ask your nurse or doctor.          acetaminophen 325 MG tablet Commonly known as: TYLENOL Take 2 tablets (650 mg total) by mouth every 6 (six) hours as needed for mild pain (or Fever >/= 101).   albuterol 108 (90 Base) MCG/ACT inhaler Commonly known as: VENTOLIN HFA Inhale 2 puffs into the lungs every 6 (six) hours as needed for wheezing or shortness of breath.   ALKA-SELTZER PLUS COLD PO Take 2 tablets by mouth daily.   BLUE-EMU MAXIMUM STRENGTH EX Apply 1 application. topically daily as needed (pain).   clopidogrel 75 MG tablet Commonly known as: PLAVIX TAKE 1 TABLET BY MOUTH DAILY   cyanocobalamin 1000 MCG tablet Commonly known as: VITAMIN B12 Take 1,000 mcg by mouth daily.   dapagliflozin propanediol 5 MG Tabs tablet Commonly known as: Farxiga Take 1 tablet (5 mg total) by mouth daily before breakfast. What changed: how much to take   DULoxetine 30 MG capsule Commonly known as: CYMBALTA TAKE 1 CAPSULE BY MOUTH DAILY   Eliquis 5 MG Tabs tablet Generic drug: apixaban TAKE ONE (1) TABLET BY MOUTH TWICE DAILY   ezetimibe 10 MG tablet Commonly known as: ZETIA Take 1 tablet (10 mg total) by mouth daily.   fenofibrate 160 MG tablet Take 1 tablet (160 mg total) by mouth daily.   furosemide 20 MG tablet Commonly known as: LASIX Take 20 mg by mouth daily.   gabapentin 300 MG capsule Commonly known as: NEURONTIN TAKE 1 CAPSULE BY MOUTH 3 TIMES DAILY   HumuLIN 70/30 (70-30) 100 UNIT/ML injection Generic drug: insulin NPH-regular Human Inject 60 Units into the skin 2 (two) times daily with a  meal.   HYDROcodone-acetaminophen 10-325 MG tablet Commonly known as: NORCO Take 1 tablet by mouth every 4 (four) hours as needed for moderate pain.   insulin lispro 100 UNIT/ML KwikPen Commonly known as: HUMALOG INJECT 15-20 UNITS SUBCUTANEOUSLY THREE TIMES A DAY WITH MEALS What changed: See the new instructions.   Insulin Syringe-Needle U-100 28G X 1/2" 1 ML Misc Use to give insulin five times daily Dx E11.42   isosorbide mononitrate 30 MG 24 hr tablet Commonly known as: IMDUR TAKE 1 TABLET BY MOUTH DAILY   metoprolol succinate 50 MG 24 hr tablet Commonly known as: TOPROL-XL Take 1 tablet by mouth once daily   METOPROLOL TARTRATE PO Take 50 mg by mouth as needed (afib).   Mounjaro 7.5 MG/0.5ML Pen Generic drug: tirzepatide Inject 7.5 mg into the skin once a week.   nitroGLYCERIN 0.4 MG SL tablet Commonly known as: NITROSTAT DISSOLVE ONE TABLET  UNDER THE TONGUE EVERY 5 MINUTES AS NEEDED FOR CHEST PAIN.  DO NOT EXCEED A TOTAL OF 3 DOSES IN 15 MINUTES Strength: 0.4 mg   pantoprazole 40 MG tablet Commonly known as: PROTONIX TAKE 1 TABLET BY MOUTH DAILY        History (reviewed): Past Medical History:  Diagnosis Date   Anxiety    Arthritis    Atrial fibrillation (HCC)    CAD (coronary artery disease)    a. 2010: DES to CTO of RCA. EF 55% b. 07/2016: cath showing total occlusion within previously placed RCA stent (collaterals present), severe stenosis along LCx and OM1 (treated with 2 overlapping DES). c. repeat cath in 01/2018 showing patent stents along LCx and OM with CTO of D2, CTO of distal LCx, and CTO of RCA with collaterals present overall unchanged since 2018 with medical management recom   Cellulitis and abscess rt groin    Complication of anesthesia    " I woke up during a colonoscopy "      Depression    Diabetes mellitus    Diastolic CHF ([place])    Disorders of iron metabolism    Dysrhythmia    Fibromyalgia    GERD (gastroesophageal reflux disease)     History of hiatal hernia    Hyperlipidemia    Hypertension    Low serum testosterone level    Medically noncompliant    Myocardial infarction ([place])    05-23-20   Pneumonia    Past Surgical History:  Procedure Laterality Date   ABDOMINAL AORTOGRAM W/LOWER EXTREMITY N/A 03/21/2019   Procedure: ABDOMINAL AORTOGRAM W/LOWER EXTREMITY;  Surgeon: [name], MD;  Location: [place] [facility];  Service: Cardiovascular;  Laterality: N/A;   ABDOMINAL AORTOGRAM W/LOWER EXTREMITY N/A 11/12/2020   Procedure: ABDOMINAL AORTOGRAM W/LOWER EXTREMITY;  Surgeon: [name], MD;  Location: [place] [facility];  Service: Cardiovascular;  Laterality: N/A;   ABDOMINAL AORTOGRAM W/LOWER EXTREMITY N/A 01/28/2021   Procedure: ABDOMINAL AORTOGRAM W/LOWER EXTREMITY;  Surgeon: [name], MD;  Location: [place] [facility];  Service: Cardiovascular;  Laterality: N/A;   ABDOMINAL AORTOGRAM W/LOWER EXTREMITY Bilateral 07/29/2021   Procedure: ABDOMINAL AORTOGRAM W/LOWER EXTREMITY;  Surgeon: [name], MD;  Location: [place] [facility];  Service: Cardiovascular;  Laterality: Bilateral;   ANGIOPLASTY N/A 08/15/2021   Procedure: ATTEMPTED RIGHT PERONEAL  ANGIOPLASTY, LEFT PERONEAL ANGIOPLASTY;  Surgeon: [name], MD;  Location: [place];  Service: Vascular;  Laterality: N/A;   BACK SURGERY  2015   ACDF by Dr. Orland Jarred STUDY  12/23/2021   Procedure: BUBBLE STUDY;  Surgeon: Elouise Munroe, MD;  Location: [facility] ENDOSCOPY;  Service: Cardiology;;   COLONOSCOPY N/A 10/01/2014   Dr. [name]: multiple tubular adenomas removed, colonic diverticulosis, redundant colon. next tcs advised for 09/2017. PATIENT NEEDS PROPOFOL FOR FAILED CONSCIOUS SEDATION   CORONARY STENT INTERVENTION N/A 07/30/2016   Procedure: Coronary Stent Intervention;  Surgeon: [name], MD;  Location: [place] [facility];  Service: Cardiovascular;  Laterality: N/A;   CORONARY STENT PLACEMENT  2000   By Dr. Olevia Perches   EP IMPLANTABLE  DEVICE N/A 05/25/2016   Procedure: Loop Recorder Insertion;  Surgeon: [name], MD;  Location: [place] [facility];  Service: Cardiovascular;  Laterality: N/A;   ESOPHAGOGASTRODUODENOSCOPY     esophagus [facility] at [facility]   ESOPHAGOGASTRODUODENOSCOPY N/A 10/01/2014   Dr. [name]: patchy mottling/erythema and minimal polypoid appearance of gastric mucosa. bx with mild inlammation but  no H.pylori   FEMORAL-POPLITEAL BYPASS GRAFT Left 11/22/2020   Procedure: LEFT FEMORAL-POPLITEAL BYPASS GRAFT;  Surgeon: [name], MD;  [facility];  Service: Vascular;  Laterality: Left;   FEMORAL-POPLITEAL BYPASS GRAFT Left 08/15/2021   Procedure: REDO LEFT FEMORAL-POPLITEAL BYPASS USING PROPATEN GRAFT;  Surgeon: [name], MD;  Location: [place];  Service: Vascular;  Laterality: Left;   HERNIA REPAIR  1194   umbilical   INSERTION OF ILIAC STENT Right 11/22/2020   Procedure: INSERTION OF ELUVIA STENT INTO RIGHT DISTAL SUPERFICIAL FEMORAL ARTERY;  Surgeon: [name], MD;  Location: [place];  Service: Vascular;  Laterality: Right;   LEFT HEART CATH AND CORONARY ANGIOGRAPHY N/A 07/30/2016   Procedure: Left Heart Cath and Coronary Angiography;  Surgeon: [name], MD;  Location: [place] [facility];  Service: Cardiovascular;  Laterality: N/A;   LEFT HEART CATH AND CORONARY ANGIOGRAPHY N/A 01/19/2018   Procedure: LEFT HEART CATH AND CORONARY ANGIOGRAPHY;  Surgeon: [name], MD;  Location: [place] [facility];  Service: Cardiovascular;  Laterality: N/A;   LEFT HEART CATH AND CORONARY ANGIOGRAPHY N/A 05/24/2020   Procedure: LEFT HEART CATH AND CORONARY ANGIOGRAPHY;  Surgeon: Burnell Blanks, MD;  Location: [place] [facility];  Service: Cardiovascular;  Laterality: N/A;   LEFT HEART CATH AND CORONARY ANGIOGRAPHY N/A 08/06/2021   Procedure: LEFT HEART CATH AND CORONARY ANGIOGRAPHY;  Surgeon: Burnell Blanks, MD;  Location: [place] [facility];  Service: Cardiovascular;   Laterality: N/A;   LESION REMOVAL     Lip and hand    LOWER EXTREMITY ANGIOGRAM Right 11/22/2020   Procedure: RIGHT LEG ANGIOGRAM;  Surgeon: [name], MD;  [facility];  Service: Vascular;  Laterality: Right;   LOWER EXTREMITY ANGIOGRAM Right 08/15/2021   Procedure: RIGHT LOWER EXTREMITY ANGIOGRAM;  Surgeon: [name], MD;  [facility];  Service: Vascular;  Laterality: Right;   LOWER EXTREMITY ANGIOGRAPHY N/A 04/18/2019   Procedure: LOWER EXTREMITY ANGIOGRAPHY;  Surgeon: [name], MD;  Location: [place] [facility];  Service: Cardiovascular;  Laterality: N/A;   LOWER EXTREMITY ANGIOGRAPHY N/A 08/18/2021   Procedure: Lower Extremity Angiography;  Surgeon: Waynetta Sandy, MD;  Location: [place] [facility];  Service: Cardiovascular;  Laterality: N/A;   NECK SURGERY     PERIPHERAL VASCULAR BALLOON ANGIOPLASTY  04/18/2019   Procedure: PERIPHERAL VASCULAR BALLOON ANGIOPLASTY;  Surgeon: [name], MD;  Location: [place] [facility];  Service: Cardiovascular;;   PERIPHERAL VASCULAR BALLOON ANGIOPLASTY Left 11/12/2020   Procedure: PERIPHERAL VASCULAR BALLOON ANGIOPLASTY;  Surgeon: [name], MD;  Location: [place] [facility];  Service: Cardiovascular;  Laterality: Left;  Failed PTA of superficial femoral artery.   PERIPHERAL VASCULAR BALLOON ANGIOPLASTY Right 08/18/2021   Procedure: PERIPHERAL VASCULAR BALLOON ANGIOPLASTY;  Surgeon: Waynetta Sandy, MD;  Location: [place] [facility];  Service: Cardiovascular;  Laterality: Right;  peroneal   PERIPHERAL VASCULAR INTERVENTION Right 03/21/2019   Procedure: PERIPHERAL VASCULAR INTERVENTION;  Surgeon: [name], MD;  Location: [place] [facility];  Service: Cardiovascular;  Laterality: Right;  superficial femoral   PERIPHERAL VASCULAR INTERVENTION Right 08/18/2021   Procedure: PERIPHERAL VASCULAR INTERVENTION;  Surgeon: Waynetta Sandy, MD;  Location: [place] [facility];  Service:  Cardiovascular;  Laterality: Right;  tibial peroneal trunk   TEE WITHOUT CARDIOVERSION N/A 12/23/2021   Procedure: TRANSESOPHAGEAL ECHOCARDIOGRAM (TEE);  Surgeon: Elouise Munroe, MD;  Location: [place];  Service: Cardiology;  Laterality: N/A;   Family History  Problem Relation Age of Onset   Diabetes Father    Valvular heart disease Father    Arthritis Father    Heart disease Father    Stroke Father    Alzheimer's disease Mother    Hyperlipidemia Mother    Hypertension Mother    Arthritis Mother    Lung cancer Mother    Stroke Mother    Headache Mother    Arthritis/Rheumatoid Sister    Diabetes Sister    Hypertension Sister    Hyperlipidemia Sister    Depression Sister    Dementia Maternal Aunt    Dementia Maternal Uncle    Heart disease Maternal Uncle    Stomach cancer Paternal Uncle    Colon cancer Neg Hx    Liver disease Neg Hx    Social History   Socioeconomic History   Marital status: Married    Spouse name: Belenda Cruise   Number of children: 2   Years of education: 12   Highest education level: 12th grade  Occupational History   Occupation: retired  Tobacco Use   Smoking status: Former    Packs/day: 0.25    Years: 51.00    Total pack years: 12.75    Types: Cigarettes    Quit date: 08/14/2016    Years since quitting: 5.4   Smokeless tobacco: Never   Tobacco comments:    smokes  a pack a week  Vaping Use   Vaping Use: Never used  Substance and Sexual Activity   Alcohol use: No    Alcohol/week: 0.0 standard drinks of alcohol   Drug use: No   Sexual activity: Yes  Other Topics Concern   Not on file  Social History Narrative   Lives with his wife.  Retired.  Unable to afford expensive medicines.     He has 2 children from previous marriage - they live in [place]       Caffeine: 1 cup of 1/2 caff coffee in AM, drinks more if at a restaurant    Social Determinants of Health   Financial Resource Strain: Medium Risk (01/06/2021)   Overall  Financial Resource Strain (CARDIA)    Difficulty of Paying Living Expenses: Somewhat hard  Food Insecurity: No Food Insecurity (01/06/2021)   Hunger Vital Sign    Worried About Running Out of Food in the Last Year: Never true    [place] in the Last Year: Never true  Transportation Needs: No Transportation Needs (01/06/2021)   PRAPARE - Hydrologist (Medical): No    Lack of Transportation (Non-Medical): No  Physical Activity: Insufficiently Active (01/06/2021)   Exercise Vital Sign    Days of Exercise per Week: 7 days    Minutes of Exercise per Session: 10 min  Stress: No Stress Concern Present (01/06/2021)   [place]    Feeling of Stress : Only a little  Social Connections: Moderately Isolated (01/06/2021)   Social Connection and Isolation Panel [NHANES]    Frequency of Communication with Friends and Family: More than three times a week    Frequency of Social Gatherings with Friends and Family: Twice a week    Attends Religious Services: Never    Marine scientist or Organizations: No    Attends Archivist Meetings: Never    Marital Status: Married    Activities of Daily Living    01/07/2022   10:30  AM 12/20/2021    2:00 PM  In your present state of health, do you have any difficulty performing the following activities:  Hearing? 0 0  Vision? 0 0  Difficulty concentrating or making decisions? 0 0  Walking or climbing stairs? 0 0  Dressing or bathing? 0 0  Doing errands, shopping? 0 0  Preparing Food and eating ? N   Using the Toilet? N   In the past six months, have you accidently leaked urine? N   Do you have problems with loss of bowel control? N   Managing your Medications? N   Managing your Finances? N   Housekeeping or managing your Housekeeping? N     Patient Education/ Literacy How often do you need to have someone help you when you read instructions,  pamphlets, or other written materials from your doctor or pharmacy?: 1 - Never  Exercise Current Exercise Habits: The patient does not participate in regular exercise at present, Exercise limited by: orthopedic condition(s);cardiac condition(s)  Diet Patient reports consuming 2 meals a day and 2 snack(s) a day Patient reports that his primary diet is: Regular Patient reports that she does have regular access to food.   Depression Screen    01/07/2022   10:37 AM 11/13/2021    8:44 AM 11/13/2021    8:35 AM 10/23/2021    3:34 PM 05/27/2021   10:46 AM 01/06/2021    9:30 AM 11/19/2020    8:35 AM  PHQ 2/9 Scores  PHQ - 2 Score 0 4 0 2 4 0 0  PHQ- 9 Score 0 '11  14 16 '$ 0 0     Fall Risk    01/07/2022   10:37 AM 11/13/2021    8:38 AM 11/13/2021    8:35 AM 02/21/2021    8:39 AM 01/06/2021    9:04 AM  Fall Risk   Falls in the past year? 1 1 0 1 1  Number falls in past yr: '1 1  1 1  '$ Injury with Fall? 0 0  1 0  Risk for fall due to : History of fall(s);Impaired balance/gait History of fall(s)  History of fall(s) History of fall(s);Orthopedic patient;Impaired balance/gait;Impaired vision;Medication side effect  Follow up Falls evaluation completed Falls evaluation completed  Education provided Education provided;Falls prevention discussed     Objective:  Ebb A Richwine seemed alert and oriented and he participated appropriately during our telephone visit.  Blood Pressure Weight BMI  BP Readings from Last 3 Encounters:  12/26/21 (!) 108/59  12/23/21 119/82  12/01/21 (!) 159/89   Wt Readings from Last 3 Encounters:  12/26/21 281 lb 3.2 oz (127.6 kg)  12/23/21 280 lb (127 kg)  12/19/21 268 lb (121.6 kg)   BMI Readings from Last 1 Encounters:  12/26/21 36.10 kg/m    *Unable to obtain current vital signs, weight, and BMI due to telephone visit type  Hearing/Vision  Brittain did not seem to have difficulty with hearing/understanding during the telephone conversation Reports that he has had a  formal eye exam by an eye care professional within the past year Reports that he has not had a formal hearing evaluation within the past year *Unable to fully assess hearing and vision during telephone visit type  Cognitive Function:    01/07/2022   10:33 AM 01/06/2021    9:18 AM 01/05/2020    8:26 AM 01/04/2019    8:38 AM  6CIT Screen  What Year?  0 points 0 points 0 points  What month?  0 points 0 points 0 points  What time? 0 points 0 points 0 points 0 points  Count back from 20 0 points 0 points  0 points  Months in reverse 0 points 0 points 0 points 0 points  Repeat phrase 0 points 2 points 0 points 2 points  Total Score  2 points  2 points   (Normal:0-7, Significant for Dysfunction: >8)  Normal Cognitive Function Screening: Yes   Immunization & Health Maintenance Record Immunization History  Administered Date(s) Administered   Influenza,inj,Quad PF,6+ Mos 02/26/2016   Influenza,inj,quad, With Preservative 02/26/2016   Moderna Sars-Covid-2 Vaccination 07/20/2019, 08/22/2019   Tdap 09/17/2015    Health Maintenance  Topic Date Due   COVID-19 Vaccine (3 - Moderna risk series) 09/19/2019   URINE MICROALBUMIN  02/21/2022   Zoster Vaccines- Shingrix (1 of 2) 02/13/2022 (Originally 10/11/1969)   INFLUENZA VACCINE  08/16/2022 (Originally 12/16/2021)   Pneumonia Vaccine 54+ Years old (1 - PCV) 11/14/2022 (Originally 10/12/2015)   FOOT EXAM  06/23/2022   HEMOGLOBIN A1C  06/28/2022   OPHTHALMOLOGY EXAM  07/01/2022   COLONOSCOPY (Pts 45-90yr Insurance coverage will need to be confirmed)  09/30/2024   TETANUS/TDAP  09/16/2025   Hepatitis C Screening  Completed   HPV VACCINES  Aged Out       Assessment  This is a routine wellness examination for [employer]  Health Maintenance: Due or Overdue Health Maintenance Due  Topic Date Due   COVID-19 Vaccine (3 - Moderna risk series) 09/19/2019   URINE MICROALBUMIN  02/21/2022    Filemon A Bero does not need a referral for  Community Assistance: Care Management:   no Social Work:    no Prescription Assistance:  no Nutrition/Diabetes Education:  no   Plan:  Personalized Goals  Goals Addressed             This Visit's Progress    Patient Stated       01/07/2022 AWV Goal: Exercise for General Health  Patient will verbalize understanding of the benefits of increased physical activity: Exercising regularly is important. It will improve your overall fitness, flexibility, and endurance. Regular exercise also will improve your overall health. It can help you control your weight, reduce stress, and improve your bone density. Over the next year, patient will increase physical activity as tolerated with a goal of at least 150 minutes of moderate physical activity per week.  You can tell that you are exercising at a moderate intensity if your heart starts beating faster and you start breathing faster but can still hold a conversation. Moderate-intensity exercise ideas include: Walking 1 mile (1.6 km) in about 15 minutes Biking Hiking Golfing Dancing Water aerobics Patient will verbalize understanding of everyday activities that increase physical activity by providing examples like the following: Yard work, such as: PSales promotion account executiveGardening Washing windows or floors Patient will be able to explain general safety guidelines for exercising:  Before you start a new exercise program, talk with your health care provider. Do not exercise so much that you hurt yourself, feel dizzy, or get very short of breath. Wear comfortable clothes and wear shoes with good support. Drink plenty of water while you exercise to prevent dehydration or heat stroke. Work out until your breathing and your heartbeat get faster.        Personalized Health Maintenance & Screening Recommendations  Diabetes screening  Lung Cancer Screening  Recommended: no (  Low Dose CT Chest recommended if Age 20-80 years, 30 pack-year currently smoking OR have quit w/in past 15 years) Hepatitis C Screening recommended: no HIV Screening recommended: no  Advanced Directives: Written information was not prepared per patient's request.  Referrals & Orders No orders of the defined types were placed in this encounter.   Follow-up Plan Follow-up with [name], FNP as planned    I have personally reviewed and noted the following in the patient's chart:   Medical and social history Use of alcohol, tobacco or illicit drugs  Current medications and supplements Functional ability and status Nutritional status Physical activity Advanced directives List of other physicians Hospitalizations, surgeries, and ER visits in previous 12 months Vitals Screenings to include cognitive, depression, and falls Referrals and appointments  In addition, I have reviewed and discussed with Daisy A Schuneman certain preventive protocols, quality metrics, and best practice recommendations. A written personalized care plan for preventive services as well as general preventive health recommendations is available and can be mailed to the patient at his request.      Burnadette Pop  01/07/2022   Patient declined after visit summary

## 2022-01-08 DIAGNOSIS — S81801D Unspecified open wound, right lower leg, subsequent encounter: Secondary | ICD-10-CM | POA: Diagnosis not present

## 2022-01-08 DIAGNOSIS — S81802D Unspecified open wound, left lower leg, subsequent encounter: Secondary | ICD-10-CM | POA: Diagnosis not present

## 2022-01-08 DIAGNOSIS — L97522 Non-pressure chronic ulcer of other part of left foot with fat layer exposed: Secondary | ICD-10-CM | POA: Diagnosis not present

## 2022-01-08 DIAGNOSIS — I70244 Atherosclerosis of native arteries of left leg with ulceration of heel and midfoot: Secondary | ICD-10-CM | POA: Diagnosis not present

## 2022-01-08 DIAGNOSIS — I739 Peripheral vascular disease, unspecified: Secondary | ICD-10-CM | POA: Diagnosis not present

## 2022-01-08 DIAGNOSIS — E11622 Type 2 diabetes mellitus with other skin ulcer: Secondary | ICD-10-CM | POA: Diagnosis not present

## 2022-01-08 DIAGNOSIS — L97911 Non-pressure chronic ulcer of unspecified part of right lower leg limited to breakdown of skin: Secondary | ICD-10-CM | POA: Diagnosis not present

## 2022-01-08 DIAGNOSIS — I50813 Acute on chronic right heart failure: Secondary | ICD-10-CM | POA: Diagnosis not present

## 2022-01-08 DIAGNOSIS — E1140 Type 2 diabetes mellitus with diabetic neuropathy, unspecified: Secondary | ICD-10-CM | POA: Diagnosis not present

## 2022-01-09 ENCOUNTER — Encounter: Payer: Self-pay | Admitting: Family

## 2022-01-09 ENCOUNTER — Ambulatory Visit (INDEPENDENT_AMBULATORY_CARE_PROVIDER_SITE_OTHER): Payer: Medicare Other | Admitting: Family

## 2022-01-09 VITALS — BP 118/57 | HR 93 | Temp 97.6°F | Resp 20 | Ht 74.0 in | Wt 270.0 lb

## 2022-01-09 DIAGNOSIS — E1142 Type 2 diabetes mellitus with diabetic polyneuropathy: Secondary | ICD-10-CM | POA: Diagnosis not present

## 2022-01-09 DIAGNOSIS — F419 Anxiety disorder, unspecified: Secondary | ICD-10-CM

## 2022-01-09 DIAGNOSIS — F331 Major depressive disorder, recurrent, moderate: Secondary | ICD-10-CM

## 2022-01-09 MED ORDER — DULOXETINE HCL 30 MG PO CPEP: 90.0000 mg | ORAL_CAPSULE | Freq: Every day | ORAL | 1 refills | Status: DC

## 2022-01-09 MED ORDER — BUSPIRONE HCL 7.5 MG PO TABS: 7.5000 mg | ORAL_TABLET | Freq: Three times a day (TID) | ORAL | 2 refills | Status: DC | PRN

## 2022-01-09 NOTE — Patient Instructions (Signed)
Major Depressive Disorder, Adult Major depressive disorder (MDD) is a mental health condition. It may also be called clinical depression or unipolar depression. MDD causes symptoms of sadness, hopelessness, and loss of interest in things. These symptoms last most of the day, almost every day, for 2 weeks. MDD can also cause physical symptoms. It can interfere with relationships and with everyday activities, such as work, school, and activities that are usually pleasant. MDD may be mild, moderate, or severe. It may be single-episode MDD, which happens once, or recurrent MDD, which may occur multiple times. What are the causes? The exact cause of this condition is not known. MDD is most likely caused by a combination of things, which may include: Your personality traits. Learned or conditioned behaviors or thoughts or feelings that reinforce negativity. Any alcohol or substance misuse. Long-term (chronic) physical or mental health illness. Going through a traumatic experience or major life changes. What increases the risk? The following factors may make someone more likely to develop MDD: A family history of depression. Being a woman. Troubled family relationships. Abnormally low levels of certain brain chemicals. Traumatic or painful events in childhood, especially abuse or loss of a parent. A lot of stress from life experiences, such as poor living conditions or discrimination. Chronic physical illness or other mental health disorders. What are the signs or symptoms? The main symptoms of MDD usually include: Constant depressed or irritable mood. A loss of interest in things and activities. Other symptoms include: Sleeping or eating too much or too little. Unexplained weight gain or weight loss. Tiredness or low energy. Being agitated, restless, or weak. Feeling hopeless, worthless, or guilty. Trouble thinking clearly or making decisions. Thoughts of suicide or thoughts of harming  others. Isolating oneself or avoiding other people or activities. Trouble completing tasks, work, or any normal obligations. Severe symptoms of this condition may include: Psychotic depression.This may include false beliefs, or delusions. It may also include seeing, hearing, tasting, smelling, or feeling things that are not real (hallucinations). Chronic depression or persistent depressive disorder. This is low-level depression that lasts for at least 2 years. Melancholic depression, or feeling extremely sad and hopeless. Catatonic depression, which includes trouble speaking and trouble moving. How is this diagnosed? This condition may be diagnosed based on: Your symptoms. Your medical and mental health history. You may be asked questions about your lifestyle, including any drug and alcohol use. A physical exam. Blood tests to rule out other conditions. MDD is confirmed if you have the following symptoms most of the day, nearly every day, in a 2-week period: Either a depressed mood or loss of interest. At least four other MDD symptoms. How is this treated? This condition is usually treated by mental health professionals, such as psychologists, psychiatrists, and clinical social workers. You may need more than one type of treatment. Treatment may include: Psychotherapy, also called talk therapy or counseling. Types of psychotherapy include: Cognitive behavioral therapy (CBT). This teaches you to recognize unhealthy feelings, thoughts, and behaviors, and replace them with positive thoughts and actions. Interpersonal therapy (IPT). This helps you to improve the way you communicate with others or relate to them. Family therapy. This treatment includes members of your family. Medicines to treat anxiety and depression. These medicines help to balance the brain chemicals that affect your emotions. Lifestyle changes. You may be asked to: Limit alcohol use and avoid drug use. Get regular  exercise. Get plenty of sleep. Make healthy eating choices. Spend more time outdoors. Brain stimulation. This may  be done if symptoms are very severe and other treatments have not worked. Examples of this treatment are electroconvulsive therapy and transcranial magnetic stimulation. Follow these instructions at home: Activity Exercise regularly and spend time outdoors. Find activities that you enjoy doing, and make time to do them. Find healthy ways to manage stress, such as: Meditation or deep breathing. Spending time in nature. Journaling. Return to your normal activities as told by your health care provider. Ask your health care provider what activities are safe for you. Alcohol and drug use If you drink alcohol: Limit how much you use to: 0-1 drink a day for women who are not pregnant. 0-2 drinks a day for men. Be aware of how much alcohol is in your drink. In the [place] one drink equals one 12 oz bottle of beer (355 mL), one 5 oz glass of wine (148 mL), or one 1 oz glass of hard liquor (44 mL). Discuss your alcohol use with your health care provider. Alcohol can affect any antidepressant medicines you are taking. Discuss any drug use with your health care provider. General instructions  Take over-the-counter and prescription medicines only as told by your health care provider. Eat a healthy diet and get plenty of sleep. Consider joining a support group. Your health care provider may be able to recommend one. Keep all follow-up visits as told by your health care provider. This is important. Where to find more information [employer] on Mental Illness: www.nami.[employer]: https://carter.com/ Contact a health care provider if: Your symptoms get worse. You develop new symptoms. Get help right away if: You self-harm. You have serious thoughts about hurting yourself or others. You hallucinate. If you ever feel like you may hurt yourself or  others, or have thoughts about taking your own life, get help right away. Go to your nearest emergency department or: Call your local emergency services (911 in the [place] Call a suicide crisis helpline, such as the [place] at 216-050-2784 or 988 in the [place]. This is open 24 hours a day in the [place] Text the Crisis Text Line at 201 173 0915 (in the [place].). Summary Major depressive disorder (MDD) is a mental health condition. MDD causes symptoms of sadness, hopelessness, and loss of interest in things. These symptoms last most of the day, almost every day, for 2 weeks. The symptoms of MDD can interfere with relationships and with everyday activities. Treatments and support are available for people who develop MDD. You may need more than one type of treatment. Get help right away if you have serious thoughts about hurting yourself or others. This information is not intended to replace advice given to you by your health care provider. Make sure you discuss any questions you have with your health care provider. Document Revised: 11/27/2020 Document Reviewed: 04/15/2019 Elsevier Patient Education  [place].

## 2022-01-09 NOTE — Progress Notes (Signed)
Subjective:    Patient ID: [name] [name], male    DOB: [birth year], 71 y.o.   MRN: [id number]  Chief Complaint  Patient presents with   Depression   PT presents to the office today with increased depression. His health has worsen this year and feels like can not do the things he use to.   Having a hard time sleeping.   Depression        This is a chronic problem.  The current episode started more than 1 year ago.   The onset quality is gradual.   Associated symptoms include decreased concentration, fatigue, helplessness, hopelessness, insomnia, irritable, restlessness, decreased interest, appetite change, sad and suicidal ideas (No plan).  Past medical history includes anxiety.   Anxiety Presents for follow-up visit. Symptoms include decreased concentration, excessive worry, insomnia, irritability, nervous/anxious behavior, restlessness and suicidal ideas (No plan).        Review of Systems  Constitutional:  Positive for appetite change, fatigue and irritability.  Psychiatric/Behavioral:  Positive for decreased concentration, depression and suicidal ideas (No plan). The patient is nervous/anxious and has insomnia.   All other systems reviewed and are negative.      Objective:   Physical Exam Vitals reviewed.  Constitutional:      General: He is irritable. He is not in acute distress.    Appearance: He is well-developed. He is obese.  HENT:     Head: Normocephalic.  Eyes:     General:        Right eye: No discharge.        Left eye: No discharge.     Pupils: Pupils are equal, round, and reactive to light.  Neck:     Thyroid: No thyromegaly.  Cardiovascular:     Rate and Rhythm: Normal rate and regular rhythm.     Heart sounds: Normal heart sounds. No murmur heard. Pulmonary:     Effort: Pulmonary effort is normal. No respiratory distress.     Breath sounds: Normal breath sounds. No wheezing.  Abdominal:     General: Bowel sounds are normal. There is no distension.      Palpations: Abdomen is soft.     Tenderness: There is no abdominal tenderness.  Musculoskeletal:        General: No tenderness. Normal range of motion.     Cervical back: Normal range of motion and neck supple.  Skin:    General: Skin is warm and dry.     Findings: No erythema or rash.  Neurological:     Mental Status: He is alert and oriented to person, place, and time.     Cranial Nerves: No cranial nerve deficit.     Deep Tendon Reflexes: Reflexes are normal and symmetric.  Psychiatric:        Mood and Affect: Mood is depressed. Affect is flat.        Behavior: Behavior normal.        Thought Content: Thought content normal.        Judgment: Judgment normal.     BP (!) 118/57   Pulse 93   Temp 97.6 F (36.4 C) (Oral)   Resp 20   Ht '6\' 2"'$  (1.88 m)   Wt 270 lb (122.5 kg)   SpO2 99%   BMI 34.67 kg/m      Assessment & Plan:  [name] [name] comes in today with chief complaint of Depression   Diagnosis and orders addressed:  1. Anxiety - Ambulatory referral to  Behavioral Health - busPIRone (BUSPAR) 7.5 MG tablet; Take 1 tablet (7.5 mg total) by mouth 3 (three) times daily as needed.  Dispense: 90 tablet; Refill: 2  2. Moderate episode of recurrent major depressive disorder ([place]) - Ambulatory referral to [facility] - DULoxetine (CYMBALTA) 30 MG capsule; Take 3 capsules (90 mg total) by mouth daily.  Dispense: 120 capsule; Refill: 1 - busPIRone (BUSPAR) 7.5 MG tablet; Take 1 tablet (7.5 mg total) by mouth 3 (three) times daily as needed.  Dispense: 90 tablet; Refill: 2  3. Diabetic polyneuropathy associated with type 2 diabetes mellitus (HCC) - DULoxetine (CYMBALTA) 30 MG capsule; Take 3 capsules (90 mg total) by mouth daily.  Dispense: 120 capsule; Refill: 1   Will increase Cymbalta to 90 mg from 60 mg  Add Buspar 7.5 mg TID prn  Referral to [facility] to ED if SI Stress management   Follow up plan: 1 month   [name], FNP

## 2022-01-15 ENCOUNTER — Ambulatory Visit: Payer: Medicare Other | Admitting: Infectious Diseases

## 2022-01-16 DIAGNOSIS — I739 Peripheral vascular disease, unspecified: Secondary | ICD-10-CM | POA: Diagnosis not present

## 2022-01-16 DIAGNOSIS — E11622 Type 2 diabetes mellitus with other skin ulcer: Secondary | ICD-10-CM | POA: Diagnosis not present

## 2022-01-16 DIAGNOSIS — E1140 Type 2 diabetes mellitus with diabetic neuropathy, unspecified: Secondary | ICD-10-CM | POA: Diagnosis not present

## 2022-01-16 DIAGNOSIS — S81802D Unspecified open wound, left lower leg, subsequent encounter: Secondary | ICD-10-CM | POA: Diagnosis not present

## 2022-01-16 DIAGNOSIS — I70244 Atherosclerosis of native arteries of left leg with ulceration of heel and midfoot: Secondary | ICD-10-CM | POA: Diagnosis not present

## 2022-01-16 DIAGNOSIS — I50813 Acute on chronic right heart failure: Secondary | ICD-10-CM | POA: Diagnosis not present

## 2022-01-16 DIAGNOSIS — L97522 Non-pressure chronic ulcer of other part of left foot with fat layer exposed: Secondary | ICD-10-CM | POA: Diagnosis not present

## 2022-01-16 DIAGNOSIS — L97911 Non-pressure chronic ulcer of unspecified part of right lower leg limited to breakdown of skin: Secondary | ICD-10-CM | POA: Diagnosis not present

## 2022-01-16 DIAGNOSIS — S81801D Unspecified open wound, right lower leg, subsequent encounter: Secondary | ICD-10-CM | POA: Diagnosis not present

## 2022-01-20 ENCOUNTER — Emergency Department (HOSPITAL_COMMUNITY)
Admission: EM | Admit: 2022-01-20 | Discharge: 2022-01-20 | Disposition: A | Discharge status: Home / Self Care | Payer: Medicare Other | Source: Home / Self Care | Attending: Emergency Medicine | Admitting: Emergency Medicine

## 2022-01-20 ENCOUNTER — Other Ambulatory Visit: Payer: Self-pay

## 2022-01-20 ENCOUNTER — Ambulatory Visit (INDEPENDENT_AMBULATORY_CARE_PROVIDER_SITE_OTHER): Payer: Medicare Other | Admitting: Nurse Practitioner

## 2022-01-20 ENCOUNTER — Emergency Department (HOSPITAL_COMMUNITY): Payer: Medicare Other

## 2022-01-20 ENCOUNTER — Encounter: Payer: Self-pay | Admitting: Nurse Practitioner

## 2022-01-20 ENCOUNTER — Encounter (HOSPITAL_COMMUNITY): Payer: Self-pay

## 2022-01-20 VITALS — BP 122/64 | HR 92 | Temp 97.8°F | Ht 74.0 in

## 2022-01-20 DIAGNOSIS — I509 Heart failure, unspecified: Secondary | ICD-10-CM | POA: Diagnosis not present

## 2022-01-20 DIAGNOSIS — I739 Peripheral vascular disease, unspecified: Secondary | ICD-10-CM | POA: Diagnosis not present

## 2022-01-20 DIAGNOSIS — E871 Hypo-osmolality and hyponatremia: Secondary | ICD-10-CM | POA: Diagnosis not present

## 2022-01-20 DIAGNOSIS — E872 Acidosis, unspecified: Secondary | ICD-10-CM | POA: Diagnosis not present

## 2022-01-20 DIAGNOSIS — Z794 Long term (current) use of insulin: Secondary | ICD-10-CM | POA: Insufficient documentation

## 2022-01-20 DIAGNOSIS — R7989 Other specified abnormal findings of blood chemistry: Secondary | ICD-10-CM | POA: Diagnosis not present

## 2022-01-20 DIAGNOSIS — L03116 Cellulitis of left lower limb: Secondary | ICD-10-CM | POA: Insufficient documentation

## 2022-01-20 DIAGNOSIS — K759 Inflammatory liver disease, unspecified: Secondary | ICD-10-CM | POA: Diagnosis not present

## 2022-01-20 DIAGNOSIS — A419 Sepsis, unspecified organism: Secondary | ICD-10-CM | POA: Diagnosis not present

## 2022-01-20 DIAGNOSIS — R109 Unspecified abdominal pain: Secondary | ICD-10-CM | POA: Diagnosis not present

## 2022-01-20 DIAGNOSIS — L97522 Non-pressure chronic ulcer of other part of left foot with fat layer exposed: Secondary | ICD-10-CM | POA: Diagnosis not present

## 2022-01-20 DIAGNOSIS — I5020 Unspecified systolic (congestive) heart failure: Secondary | ICD-10-CM | POA: Diagnosis not present

## 2022-01-20 DIAGNOSIS — I248 Other forms of acute ischemic heart disease: Secondary | ICD-10-CM | POA: Diagnosis not present

## 2022-01-20 DIAGNOSIS — E1165 Type 2 diabetes mellitus with hyperglycemia: Secondary | ICD-10-CM | POA: Diagnosis not present

## 2022-01-20 DIAGNOSIS — Z9861 Coronary angioplasty status: Secondary | ICD-10-CM | POA: Diagnosis not present

## 2022-01-20 DIAGNOSIS — E1122 Type 2 diabetes mellitus with diabetic chronic kidney disease: Secondary | ICD-10-CM | POA: Diagnosis not present

## 2022-01-20 DIAGNOSIS — R112 Nausea with vomiting, unspecified: Secondary | ICD-10-CM | POA: Diagnosis not present

## 2022-01-20 DIAGNOSIS — E1142 Type 2 diabetes mellitus with diabetic polyneuropathy: Secondary | ICD-10-CM | POA: Diagnosis not present

## 2022-01-20 DIAGNOSIS — L89312 Pressure ulcer of right buttock, stage 2: Secondary | ICD-10-CM | POA: Diagnosis not present

## 2022-01-20 DIAGNOSIS — K760 Fatty (change of) liver, not elsewhere classified: Secondary | ICD-10-CM | POA: Diagnosis not present

## 2022-01-20 DIAGNOSIS — K219 Gastro-esophageal reflux disease without esophagitis: Secondary | ICD-10-CM | POA: Diagnosis not present

## 2022-01-20 DIAGNOSIS — I482 Chronic atrial fibrillation, unspecified: Secondary | ICD-10-CM | POA: Diagnosis not present

## 2022-01-20 DIAGNOSIS — R0602 Shortness of breath: Secondary | ICD-10-CM | POA: Insufficient documentation

## 2022-01-20 DIAGNOSIS — K7200 Acute and subacute hepatic failure without coma: Secondary | ICD-10-CM | POA: Diagnosis not present

## 2022-01-20 DIAGNOSIS — D500 Iron deficiency anemia secondary to blood loss (chronic): Secondary | ICD-10-CM | POA: Diagnosis not present

## 2022-01-20 DIAGNOSIS — Z7901 Long term (current) use of anticoagulants: Secondary | ICD-10-CM | POA: Insufficient documentation

## 2022-01-20 DIAGNOSIS — R531 Weakness: Secondary | ICD-10-CM | POA: Diagnosis not present

## 2022-01-20 DIAGNOSIS — R7881 Bacteremia: Secondary | ICD-10-CM | POA: Diagnosis not present

## 2022-01-20 DIAGNOSIS — K810 Acute cholecystitis: Secondary | ICD-10-CM | POA: Diagnosis not present

## 2022-01-20 DIAGNOSIS — N1831 Chronic kidney disease, stage 3a: Secondary | ICD-10-CM | POA: Diagnosis not present

## 2022-01-20 DIAGNOSIS — E1169 Type 2 diabetes mellitus with other specified complication: Secondary | ICD-10-CM | POA: Diagnosis not present

## 2022-01-20 DIAGNOSIS — N179 Acute kidney failure, unspecified: Secondary | ICD-10-CM | POA: Diagnosis not present

## 2022-01-20 DIAGNOSIS — E669 Obesity, unspecified: Secondary | ICD-10-CM | POA: Diagnosis not present

## 2022-01-20 DIAGNOSIS — S81801D Unspecified open wound, right lower leg, subsequent encounter: Secondary | ICD-10-CM | POA: Diagnosis not present

## 2022-01-20 DIAGNOSIS — D509 Iron deficiency anemia, unspecified: Secondary | ICD-10-CM | POA: Diagnosis not present

## 2022-01-20 DIAGNOSIS — I5021 Acute systolic (congestive) heart failure: Secondary | ICD-10-CM | POA: Diagnosis not present

## 2022-01-20 DIAGNOSIS — E785 Hyperlipidemia, unspecified: Secondary | ICD-10-CM | POA: Diagnosis not present

## 2022-01-20 DIAGNOSIS — I700 Atherosclerosis of aorta: Secondary | ICD-10-CM | POA: Diagnosis not present

## 2022-01-20 DIAGNOSIS — I480 Paroxysmal atrial fibrillation: Secondary | ICD-10-CM | POA: Diagnosis not present

## 2022-01-20 DIAGNOSIS — F419 Anxiety disorder, unspecified: Secondary | ICD-10-CM | POA: Diagnosis not present

## 2022-01-20 DIAGNOSIS — I5040 Unspecified combined systolic (congestive) and diastolic (congestive) heart failure: Secondary | ICD-10-CM | POA: Diagnosis not present

## 2022-01-20 DIAGNOSIS — I5042 Chronic combined systolic (congestive) and diastolic (congestive) heart failure: Secondary | ICD-10-CM | POA: Diagnosis not present

## 2022-01-20 DIAGNOSIS — F32A Depression, unspecified: Secondary | ICD-10-CM | POA: Diagnosis not present

## 2022-01-20 DIAGNOSIS — R6510 Systemic inflammatory response syndrome (SIRS) of non-infectious origin without acute organ dysfunction: Secondary | ICD-10-CM | POA: Diagnosis not present

## 2022-01-20 DIAGNOSIS — I25118 Atherosclerotic heart disease of native coronary artery with other forms of angina pectoris: Secondary | ICD-10-CM | POA: Diagnosis not present

## 2022-01-20 DIAGNOSIS — Z0389 Encounter for observation for other suspected diseases and conditions ruled out: Secondary | ICD-10-CM | POA: Diagnosis not present

## 2022-01-20 DIAGNOSIS — E1140 Type 2 diabetes mellitus with diabetic neuropathy, unspecified: Secondary | ICD-10-CM | POA: Diagnosis not present

## 2022-01-20 DIAGNOSIS — J9601 Acute respiratory failure with hypoxia: Secondary | ICD-10-CM | POA: Diagnosis not present

## 2022-01-20 DIAGNOSIS — Z20822 Contact with and (suspected) exposure to covid-19: Secondary | ICD-10-CM | POA: Insufficient documentation

## 2022-01-20 DIAGNOSIS — I5023 Acute on chronic systolic (congestive) heart failure: Secondary | ICD-10-CM | POA: Diagnosis not present

## 2022-01-20 DIAGNOSIS — I2510 Atherosclerotic heart disease of native coronary artery without angina pectoris: Secondary | ICD-10-CM | POA: Diagnosis not present

## 2022-01-20 DIAGNOSIS — I70244 Atherosclerosis of native arteries of left leg with ulceration of heel and midfoot: Secondary | ICD-10-CM | POA: Diagnosis not present

## 2022-01-20 DIAGNOSIS — I96 Gangrene, not elsewhere classified: Secondary | ICD-10-CM | POA: Diagnosis not present

## 2022-01-20 DIAGNOSIS — S81802D Unspecified open wound, left lower leg, subsequent encounter: Secondary | ICD-10-CM | POA: Diagnosis not present

## 2022-01-20 DIAGNOSIS — L97911 Non-pressure chronic ulcer of unspecified part of right lower leg limited to breakdown of skin: Secondary | ICD-10-CM | POA: Diagnosis not present

## 2022-01-20 DIAGNOSIS — L89322 Pressure ulcer of left buttock, stage 2: Secondary | ICD-10-CM | POA: Diagnosis not present

## 2022-01-20 DIAGNOSIS — L97319 Non-pressure chronic ulcer of right ankle with unspecified severity: Secondary | ICD-10-CM | POA: Diagnosis not present

## 2022-01-20 DIAGNOSIS — I779 Disorder of arteries and arterioles, unspecified: Secondary | ICD-10-CM | POA: Diagnosis not present

## 2022-01-20 DIAGNOSIS — R0989 Other specified symptoms and signs involving the circulatory and respiratory systems: Secondary | ICD-10-CM | POA: Diagnosis not present

## 2022-01-20 DIAGNOSIS — E0842 Diabetes mellitus due to underlying condition with diabetic polyneuropathy: Secondary | ICD-10-CM | POA: Diagnosis not present

## 2022-01-20 DIAGNOSIS — K8050 Calculus of bile duct without cholangitis or cholecystitis without obstruction: Secondary | ICD-10-CM | POA: Diagnosis not present

## 2022-01-20 DIAGNOSIS — R509 Fever, unspecified: Secondary | ICD-10-CM | POA: Diagnosis present

## 2022-01-20 DIAGNOSIS — G9600 Cerebrospinal fluid leak, unspecified: Secondary | ICD-10-CM | POA: Diagnosis not present

## 2022-01-20 DIAGNOSIS — E1152 Type 2 diabetes mellitus with diabetic peripheral angiopathy with gangrene: Secondary | ICD-10-CM | POA: Diagnosis not present

## 2022-01-20 DIAGNOSIS — G319 Degenerative disease of nervous system, unspecified: Secondary | ICD-10-CM | POA: Diagnosis not present

## 2022-01-20 DIAGNOSIS — M50322 Other cervical disc degeneration at C5-C6 level: Secondary | ICD-10-CM | POA: Diagnosis not present

## 2022-01-20 DIAGNOSIS — I130 Hypertensive heart and chronic kidney disease with heart failure and stage 1 through stage 4 chronic kidney disease, or unspecified chronic kidney disease: Secondary | ICD-10-CM | POA: Diagnosis not present

## 2022-01-20 DIAGNOSIS — E11622 Type 2 diabetes mellitus with other skin ulcer: Secondary | ICD-10-CM | POA: Diagnosis not present

## 2022-01-20 DIAGNOSIS — K8020 Calculus of gallbladder without cholecystitis without obstruction: Secondary | ICD-10-CM | POA: Diagnosis not present

## 2022-01-20 DIAGNOSIS — R778 Other specified abnormalities of plasma proteins: Secondary | ICD-10-CM | POA: Diagnosis not present

## 2022-01-20 DIAGNOSIS — I50813 Acute on chronic right heart failure: Secondary | ICD-10-CM | POA: Diagnosis not present

## 2022-01-20 LAB — COMPREHENSIVE METABOLIC PANEL
ALT: 15 U/L (ref 0–44)
AST: 20 U/L (ref 15–41)
Albumin: 4 g/dL (ref 3.5–5.0)
Alkaline Phosphatase: 48 U/L (ref 38–126)
Anion gap: 11 (ref 5–15)
BUN: 24 mg/dL — ABNORMAL HIGH (ref 8–23)
CO2: 28 mmol/L (ref 22–32)
Calcium: 9.4 mg/dL (ref 8.9–10.3)
Chloride: 97 mmol/L — ABNORMAL LOW (ref 98–111)
Creatinine, Ser: 1.57 mg/dL — ABNORMAL HIGH (ref 0.61–1.24)
GFR, Estimated: 47 mL/min — ABNORMAL LOW (ref >60)
Glucose, Bld: 249 mg/dL — ABNORMAL HIGH (ref 70–99)
Potassium: 4.3 mmol/L (ref 3.5–5.1)
Sodium: 136 mmol/L (ref 135–145)
Total Bilirubin: 1.1 mg/dL (ref 0.3–1.2)
Total Protein: 8.1 g/dL (ref 6.5–8.1)

## 2022-01-20 LAB — CBC WITH DIFFERENTIAL/PLATELET
Abs Immature Granulocytes: 0.05 10*3/uL (ref 0.00–0.07)
Basophils Absolute: 0.1 10*3/uL (ref 0.0–0.1)
Basophils Relative: 1 %
Eosinophils Absolute: 0.1 10*3/uL (ref 0.0–0.5)
Eosinophils Relative: 1 %
HCT: 37.8 % — ABNORMAL LOW (ref 39.0–52.0)
Hemoglobin: 11.1 g/dL — ABNORMAL LOW (ref 13.0–17.0)
Immature Granulocytes: 1 %
Lymphocytes Relative: 10 %
Lymphs Abs: 1 10*3/uL (ref 0.7–4.0)
MCH: 23.1 pg — ABNORMAL LOW (ref 26.0–34.0)
MCHC: 29.4 g/dL — ABNORMAL LOW (ref 30.0–36.0)
MCV: 78.8 fL — ABNORMAL LOW (ref 80.0–100.0)
Monocytes Absolute: 0.6 10*3/uL (ref 0.1–1.0)
Monocytes Relative: 6 %
Neutro Abs: 8.5 10*3/uL — ABNORMAL HIGH (ref 1.7–7.7)
Neutrophils Relative %: 81 %
Platelets: 301 10*3/uL (ref 150–400)
RBC: 4.8 MIL/uL (ref 4.22–5.81)
RDW: 15.9 % — ABNORMAL HIGH (ref 11.5–15.5)
WBC: 10.4 10*3/uL (ref 4.0–10.5)
nRBC: 0 % (ref 0.0–0.2)

## 2022-01-20 LAB — URINALYSIS, ROUTINE W REFLEX MICROSCOPIC
Bacteria, UA: NONE SEEN
Bilirubin Urine: NEGATIVE
Glucose, UA: >=500 mg/dL — AB
Hgb urine dipstick: NEGATIVE
Ketones, ur: NEGATIVE mg/dL
Leukocytes,Ua: NEGATIVE
Nitrite: NEGATIVE
Protein, ur: NEGATIVE mg/dL
Specific Gravity, Urine: 1.027 (ref 1.005–1.030)
pH: 5 (ref 5.0–8.0)

## 2022-01-20 LAB — TROPONIN I (HIGH SENSITIVITY)
Troponin I (High Sensitivity): 42 ng/L — ABNORMAL HIGH (ref <18)
Troponin I (High Sensitivity): 75 ng/L — ABNORMAL HIGH (ref <18)

## 2022-01-20 LAB — BRAIN NATRIURETIC PEPTIDE: B Natriuretic Peptide: 267 pg/mL — ABNORMAL HIGH (ref 0.0–100.0)

## 2022-01-20 LAB — PROTIME-INR
INR: 1.4 — ABNORMAL HIGH (ref 0.8–1.2)
Prothrombin Time: 16.6 seconds — ABNORMAL HIGH (ref 11.4–15.2)

## 2022-01-20 LAB — SARS CORONAVIRUS 2 BY RT PCR: SARS Coronavirus 2 by RT PCR: NEGATIVE

## 2022-01-20 LAB — LACTIC ACID, PLASMA
Lactic Acid, Venous: 1.8 mmol/L (ref 0.5–1.9)
Lactic Acid, Venous: 1.4 mmol/L (ref 0.5–1.9)

## 2022-01-20 LAB — APTT: aPTT: 49 seconds — ABNORMAL HIGH (ref 24–36)

## 2022-01-20 MED ORDER — DOXYCYCLINE HYCLATE 100 MG PO CAPS: 100.0000 mg | ORAL_CAPSULE | Freq: Two times a day (BID) | ORAL | 0 refills | Status: DC

## 2022-01-20 NOTE — ED Notes (Signed)
Oxygen remain 90 to 91% with zero 02

## 2022-01-20 NOTE — Patient Instructions (Signed)
Cellulitis, Adult  Cellulitis is a skin infection. The infected area is often warm, red, swollen, and sore. It occurs most often in the arms and lower legs. It is very important to get treated for this condition. What are the causes? This condition is caused by bacteria. The bacteria enter through a break in the skin, such as a cut, burn, insect bite, open sore, or crack. What increases the risk? This condition is more likely to occur in people who: Have a weak body defense system (immune system). Have open cuts, burns, bites, or scrapes on the skin. Are older than 71 years of age. Have a blood sugar problem (diabetes). Have a long-lasting (chronic) liver disease (cirrhosis) or kidney disease. Are very overweight (obese). Have a skin problem, such as: Itchy rash (eczema). Slow movement of blood in the veins (venous stasis). Fluid buildup below the skin (edema). Have been treated with high-energy rays (radiation). Use IV drugs. What are the signs or symptoms? Symptoms of this condition include: Skin that is: Red. Streaking. Spotting. Swollen. Sore or painful when you touch it. Warm. A fever. Chills. Blisters. How is this diagnosed? This condition is diagnosed based on: Medical history. Physical exam. Blood tests. Imaging tests. How is this treated? Treatment for this condition may include: Medicines to treat infections or allergies. Home care, such as: Rest. Placing cold or warm cloths (compresses) on the skin. Hospital care, if the condition is very bad. Follow these instructions at home: Medicines Take over-the-counter and prescription medicines only as told by your doctor. If you were prescribed an antibiotic medicine, take it as told by your doctor. Do not stop taking it even if you start to feel better. General instructions  Drink enough fluid to keep your pee (urine) pale yellow. Do not touch or rub the infected area. Raise (elevate) the infected area above  the level of your heart while you are sitting or lying down. Place cold or warm cloths on the area as told by your doctor. Keep all follow-up visits as told by your doctor. This is important. Contact a doctor if: You have a fever. You do not start to get better after 1-2 days of treatment. Your bone or joint under the infected area starts to hurt after the skin has healed. Your infection comes back. This can happen in the same area or another area. You have a swollen bump in the area. You have new symptoms. You feel ill and have muscle aches and pains. Get help right away if: Your symptoms get worse. You feel very sleepy. You throw up (vomit) or have watery poop (diarrhea) for a long time. You see red streaks coming from the area. Your red area gets larger. Your red area turns dark in color. These symptoms may represent a serious problem that is an emergency. Do not wait to see if the symptoms will go away. Get medical help right away. Call your local emergency services (911 in the [place] Do not drive yourself to the hospital. Summary Cellulitis is a skin infection. The area is often warm, red, swollen, and sore. This condition is treated with medicines, rest, and cold and warm cloths. Take all medicines only as told by your doctor. Tell your doctor if symptoms do not start to get better after 1-2 days of treatment. This information is not intended to replace advice given to you by your health care provider. Make sure you discuss any questions you have with your health care provider. Document Revised: 02/13/2021 Document   Reviewed: 02/13/2021 Elsevier Patient Education  2023 [employer]  

## 2022-01-20 NOTE — ED Provider Notes (Signed)
[facility] EMERGENCY DEPARTMENT Provider Note   CSN: [id number] Arrival date & time: 01/20/22  0023     History  Chief Complaint  Patient presents with   Wound Infection    [name] [name] is a 71 y.o. male.  Patient brought to the emergency department from home by ambulance.  EMS was originally called to the house for shortness of breath.  Patient reports that he did feel short of breath earlier, thinks he had a run of A-fib; does have a history of paroxysmal atrial fibrillation.  When EMS arrived on the scene, breathing difficulty and chest fluttering had resolved but wife reported that she was worried that he was septic.  He recently had femoral bypass surgery on the left leg secondary to gangrene of the toes.  He did have a repeat procedure and at some point during that process he did have sepsis secondary to infection of the surgical site.       Home Medications Prior to Admission medications   Medication Sig Start Date End Date Taking? Authorizing Provider  doxycycline (VIBRAMYCIN) 100 MG capsule Take 1 capsule (100 mg total) by mouth 2 (two) times daily. 01/20/22  Yes Parys Elenbaas, Gwenyth Allegra, MD  acetaminophen (TYLENOL) 325 MG tablet Take 2 tablets (650 mg total) by mouth every 6 (six) hours as needed for mild pain (or Fever >/= 101). 12/23/21   Cherene Altes, MD  albuterol (VENTOLIN HFA) 108 (90 Base) MCG/ACT inhaler Inhale 2 puffs into the lungs every 6 (six) hours as needed for wheezing or shortness of breath. 11/04/21   Ronnie Doss M, DO  busPIRone (BUSPAR) 7.5 MG tablet Take 1 tablet (7.5 mg total) by mouth 3 (three) times daily as needed. 01/09/22   [name] A, FNP  Chlorphen-Phenyleph-ASA (ALKA-SELTZER PLUS COLD PO) Take 2 tablets by mouth daily.    [provider]  clopidogrel (PLAVIX) 75 MG tablet TAKE 1 TABLET BY MOUTH DAILY 11/06/21   Angelia Mould, MD  dapagliflozin propanediol (FARXIGA) 5 MG TABS tablet Take 1 tablet (5 mg total) by mouth  daily before breakfast. Patient taking differently: Take 10 mg by mouth daily before breakfast. 04/29/21   [name], FNP  DULoxetine (CYMBALTA) 30 MG capsule Take 3 capsules (90 mg total) by mouth daily. 01/09/22   [name], FNP  ELIQUIS 5 MG TABS tablet TAKE ONE (1) TABLET BY MOUTH TWICE DAILY 01/01/22   [name] A, FNP  ezetimibe (ZETIA) 10 MG tablet Take 1 tablet (10 mg total) by mouth daily. 04/28/21   Josue Hector, MD  fenofibrate 160 MG tablet Take 1 tablet (160 mg total) by mouth daily. 04/29/21   [name] A, FNP  furosemide (LASIX) 20 MG tablet Take 20 mg by mouth daily.    [provider]  gabapentin (NEURONTIN) 300 MG capsule TAKE 1 CAPSULE BY MOUTH 3 TIMES DAILY 12/03/21   [name] A, FNP  HYDROcodone-acetaminophen (NORCO) 10-325 MG tablet Take 1 tablet by mouth every 4 (four) hours as needed for moderate pain. 08/20/21   Dagoberto Ligas, PA-C  insulin lispro (HUMALOG) 100 UNIT/ML KwikPen INJECT 15-20 UNITS SUBCUTANEOUSLY THREE TIMES A DAY WITH MEALS Patient taking differently: 15-20 Units 3 (three) times daily. Sliding scale 12/03/21   [name] A, FNP  insulin NPH-regular Human (HUMULIN 70/30) (70-30) 100 UNIT/ML injection Inject 60 Units into the skin 2 (two) times daily with a meal. 11/10/21   Hawks, Alyse Low A, FNP  Insulin Syringe-Needle U-100 28G X  1/2" 1 ML MISC Use to give insulin five times daily Dx E11.42 05/07/21   [name] A, FNP  isosorbide mononitrate (IMDUR) 30 MG 24 hr tablet TAKE 1 TABLET BY MOUTH DAILY 12/03/21   [name], FNP  Menthol, Topical Analgesic, (BLUE-EMU MAXIMUM STRENGTH EX) Apply 1 application. topically daily as needed (pain).    [provider]  metoprolol succinate (TOPROL-XL) 50 MG 24 hr tablet Take 1 tablet by mouth once daily 08/28/21   Josue Hector, MD  METOPROLOL TARTRATE PO Take 50 mg by mouth as needed (afib).    [provider]  nitroGLYCERIN (NITROSTAT) 0.4 MG SL tablet  DISSOLVE ONE TABLET UNDER THE TONGUE EVERY 5 MINUTES AS NEEDED FOR CHEST PAIN.  DO NOT EXCEED A TOTAL OF 3 DOSES IN 15 MINUTES Strength: 0.4 mg 04/28/21   Josue Hector, MD  pantoprazole (PROTONIX) 40 MG tablet TAKE 1 TABLET BY MOUTH DAILY 12/03/21   Hawks, Alyse Low A, FNP  tirzepatide [facility]) 7.5 MG/0.5ML Pen Inject 7.5 mg into the skin once a week. 11/14/21   [name], FNP  vitamin B-12 (CYANOCOBALAMIN) 1000 MCG tablet Take 1,000 mcg by mouth daily.    [provider]      Allergies    Shellfish allergy, Sulfa antibiotics, Ace inhibitors, Escitalopram, Evolocumab, Fenofibrate, Invokana [canagliflozin], Lisinopril, Metformin and related, Pravastatin sodium, Crestor [rosuvastatin], Horse-derived products, Lexapro [escitalopram oxalate], Lipitor [atorvastatin], Livalo [pitavastatin], Milk (cow), and Tape    Review of Systems   Review of Systems  Physical Exam Updated Vital Signs BP (!) 158/98 (BP Location: Left Arm)   Pulse 85   Temp 98.6 F (37 C) (Oral)   Resp 15   SpO2 99%  Physical Exam Vitals and nursing note reviewed.  Constitutional:      General: He is not in acute distress.    Appearance: He is well-developed.  HENT:     Head: Normocephalic and atraumatic.     Mouth/Throat:     Mouth: Mucous membranes are moist.  Eyes:     General: Vision grossly intact. Gaze aligned appropriately.     Extraocular Movements: Extraocular movements intact.     Conjunctiva/sclera: Conjunctivae normal.  Cardiovascular:     Rate and Rhythm: Normal rate and regular rhythm.     Pulses:          Dorsalis pedis pulses are 1+ on the left side.     Heart sounds: Normal heart sounds, S1 normal and S2 normal. No murmur heard.    No friction rub. No gallop.  Pulmonary:     Effort: Pulmonary effort is normal. No respiratory distress.     Breath sounds: Normal breath sounds.  Abdominal:     Palpations: Abdomen is soft.     Tenderness: There is no abdominal tenderness. There is  no guarding or rebound.     Hernia: No hernia is present.  Musculoskeletal:        General: No swelling.     Cervical back: Full passive range of motion without pain, normal range of motion and neck supple. No pain with movement, spinous process tenderness or muscular tenderness. Normal range of motion.     Right lower leg: No edema.     Left lower leg: No edema.  Skin:    General: Skin is warm and dry.     Capillary Refill: Capillary refill takes less than 2 seconds.     Findings: No ecchymosis, erythema, lesion or wound.  Comments: Dry gangrene distal portion of left second, third, fourth toe  Neurological:     Mental Status: He is alert and oriented to person, place, and time.     GCS: GCS eye subscore is 4. GCS verbal subscore is 5. GCS motor subscore is 6.     Cranial Nerves: Cranial nerves 2-12 are intact.     Sensory: Sensation is intact.     Motor: Motor function is intact. No weakness or abnormal muscle tone.     Coordination: Coordination is intact.  Psychiatric:        Mood and Affect: Mood normal.        Speech: Speech normal.        Behavior: Behavior normal.     ED Results / Procedures / Treatments   Labs (all labs ordered are listed, but only abnormal results are displayed) Labs Reviewed  COMPREHENSIVE METABOLIC PANEL - Abnormal; Notable for the following components:      Result Value   Chloride 97 (*)    Glucose, Bld 249 (*)    BUN 24 (*)    Creatinine, Ser 1.57 (*)    GFR, Estimated 47 (*)    All other components within normal limits  CBC WITH DIFFERENTIAL/PLATELET - Abnormal; Notable for the following components:   Hemoglobin 11.1 (*)    HCT 37.8 (*)    MCV 78.8 (*)    MCH 23.1 (*)    MCHC 29.4 (*)    RDW 15.9 (*)    Neutro Abs 8.5 (*)    All other components within normal limits  PROTIME-INR - Abnormal; Notable for the following components:   Prothrombin Time 16.6 (*)    INR 1.4 (*)    All other components within normal limits  APTT - Abnormal;  Notable for the following components:   aPTT 49 (*)    All other components within normal limits  URINALYSIS, ROUTINE W REFLEX MICROSCOPIC - Abnormal; Notable for the following components:   Glucose, UA >=500 (*)    All other components within normal limits  BRAIN NATRIURETIC PEPTIDE - Abnormal; Notable for the following components:   B Natriuretic Peptide 267.0 (*)    All other components within normal limits  TROPONIN I (HIGH SENSITIVITY) - Abnormal; Notable for the following components:   Troponin I (High Sensitivity) 42 (*)    All other components within normal limits  TROPONIN I (HIGH SENSITIVITY) - Abnormal; Notable for the following components:   Troponin I (High Sensitivity) 75 (*)    All other components within normal limits  CULTURE, BLOOD (ROUTINE X 2)  CULTURE, BLOOD (ROUTINE X 2)  SARS CORONAVIRUS 2 BY RT PCR  URINE CULTURE  LACTIC ACID, PLASMA  LACTIC ACID, PLASMA    EKG EKG Interpretation  Date/Time:  Tuesday January 20 2022 00:46:03 EDT Ventricular Rate:  88 PR Interval:    QRS Duration: 120 QT Interval:  405 QTC Calculation: 490 R Axis:   82 Text Interpretation: Age not entered, assumed to be  71 years old for purpose of ECG interpretation Atrial fibrillation IVCD, consider atypical RBBB Repol abnrm suggests ischemia, diffuse leads No significant change since last tracing Confirmed by Orpah Greek ([id number]) on 01/20/2022 1:29:38 AM  Radiology DG Chest Port 1 View  Result Date: 01/20/2022 CLINICAL DATA:  Questionable sepsis - evaluate for abnormality EXAM: PORTABLE CHEST 1 VIEW COMPARISON:  CT chest 12/17/2021, chest x-[place] 12/17/2021 FINDINGS: The heart and mediastinal contours are unchanged. Aortic calcification. Improved aeration of the  left base. No focal consolidation. No pulmonary edema. No pleural effusion. No pneumothorax. No acute osseous abnormality. IMPRESSION: No active disease. Electronically Signed   By: Iven Finn M.D.   On: 01/20/2022  01:11    Procedures Procedures    Medications Ordered in ED Medications - No data to display  ED Course/ Medical Decision Making/ A&P                           Medical Decision Making Amount and/or Complexity of Data Reviewed Labs: ordered. Radiology: ordered. ECG/medicine tests: ordered.  Risk Prescription drug management.   She comes to the ER with concerns over possible infection.  He does companied by his wife who provides further information at this time.  He did have a fever at home earlier tonight.  This concerned the wife because he did have an infection in the leg postoperatively.  She reports that there is 1 area of scab on the lower leg where it seems like there was some pus draining out of it earlier tonight.  Was able to access previous records.  Patient has been hospitalized twice with strep bacteremia in the past.  The most recent was this past month.  Prior to discharge, patient did have a TEE that did not show any vegetations.  He finished his 14 days of antibiotics.  He has not followed up with infectious disease yet.  At arrival to the ER today does not have SIRS findings.  No leukocytosis, no fever.  Patient's lactic acid is normal.  No tachycardia.  Wife reports a fever at home but this was not present here in the department and he did not receive antipyretics.  Patient has evidence of dry gangrene of the toes of the left foot but this has been present since before his last hospitalization.  He has had revascularization and he has pulses present in the ED.  Patient does have an elevated troponin.  He has chronic elevation of the troponins in the past.  He is approximately at his baseline currently.  This is likely multifactorial and not related to NSTEMI.  He does not appear to be exhibiting decompensated congestive heart failure at this time.  Reports that there was some pus coming out from under one of the chronic scabs on his shin area earlier tonight.  No  fluctuance currently, no induration.  No drainage at this time.  Will empirically cover for skin infection, follow-up with primary care.  Given return precautions.        Final Clinical Impression(s) / ED Diagnoses Final diagnoses:  Cellulitis of left lower extremity    Rx / DC Orders ED Discharge Orders          Ordered    doxycycline (VIBRAMYCIN) 100 MG capsule  2 times daily        01/20/22 0622              Orpah Greek, MD 01/20/22 [phone]

## 2022-01-20 NOTE — Progress Notes (Signed)
Established Patient Office Visit  Subjective   Patient ID: [name] [name], male    DOB: [birth year]  Age: 71 y.o. MRN: [id number]  Chief Complaint  Patient presents with   ER follow up    HPI  [name] [name] is a 71 y.o. male who presents with erythema and tenderness.  Location: left lower extremities  Onset: acute  Duration: few days and symptoms staying constant  Associated symptoms: edema  Recent treatment: none and patient was assessed about 12 hours ago at the ED and given Doxycycline. Patient has not started medication yet. Functional status affected: no   Allergies: Shellfish allergy, Sulfa antibiotics, Ace inhibitors, Escitalopram, Evolocumab, Fenofibrate, Invokana [canagliflozin], Lisinopril, Metformin and related, Pravastatin sodium, Crestor [rosuvastatin], Horse-derived products, Lexapro [escitalopram oxalate], Lipitor [atorvastatin], Livalo [pitavastatin], Milk (cow), and Tape  Patient Active Problem List   Diagnosis Date Noted   Pressure injury of skin 12/18/2021   Sepsis ([place]) 12/17/2021   Iron deficiency anemia 10/16/2021   Critical lower limb ischemia ([place]) 08/16/2021   Atherosclerosis of arteries of extremities ([place]) 08/15/2021   Coronary artery disease of native artery of native heart with stable angina pectoris ([place])    Leg wound, left 07/25/2021   S/P femoral-popliteal bypass surgery 11/22/2020   PAOD (peripheral arterial occlusive disease) ([place]) 11/22/2020   Non-ST elevation (NSTEMI) myocardial infarction ([place]) 05/23/2020   Orthostatic hypotension 01/01/2020   Statin myopathy 12/06/2019   Diarrhea 05/10/2018   PAF (paroxysmal atrial fibrillation) ([place]) 03/29/2018   Lacunar infarction ([place]) 03/29/2018   Unstable angina (HCC)    Chest pain 01/18/2018   CHF (congestive heart failure) ([place]) 10/21/2017   Anxiety 03/09/2017   Constipation 11/26/2016   History of non-ST elevation myocardial infarction (NSTEMI) 07/29/2016   Diabetes ([place]) 05/25/2016   Occlusion and  stenosis of vertebral artery    Acute renal failure ([place]) 05/23/2016   Diabetic neuropathy ([place]) 01/13/2016   Depression 11/01/2015   Erectile dysfunction 01/23/2015   Morbid obesity ([place]) 01/23/2015   Periodontal disease 01/23/2015   Stopped smoking with greater than 30 pack year history 01/23/2015   Diabetic peripheral neuropathy associated with type 2 diabetes mellitus ([place]) 11/08/2014   Mucosal abnormality of stomach    History of adenomatous polyp of colon    GERD (gastroesophageal reflux disease) 09/06/2014   Low serum testosterone level 08/01/2014   DDD (degenerative disc disease), cervical 12/20/2013   Headache 08/07/2013   Medically noncompliant 10/25/2011   CAD S/P percutaneous coronary angioplasty 10/25/2011   MURMUR 04/16/2009   Hypertension associated with diabetes ([place]) 08/02/2008   Hyperlipidemia associated with type 2 diabetes mellitus ([place]) 07/31/2008   DISORDERS OF IRON METABOLISM 07/31/2008    Patient Active Problem List   Diagnosis Date Noted   Pressure injury of skin 12/18/2021   Sepsis ([place]) 12/17/2021   Iron deficiency anemia 10/16/2021   Critical lower limb ischemia ([place]) 08/16/2021   Atherosclerosis of arteries of extremities ([place]) 08/15/2021   Coronary artery disease of native artery of native heart with stable angina pectoris ([place])    Leg wound, left 07/25/2021   S/P femoral-popliteal bypass surgery 11/22/2020   PAOD (peripheral arterial occlusive disease) ([place]) 11/22/2020   Non-ST elevation (NSTEMI) myocardial infarction ([place]) 05/23/2020   Orthostatic hypotension 01/01/2020   Statin myopathy 12/06/2019   Diarrhea 05/10/2018   PAF (paroxysmal atrial fibrillation) ([place]) 03/29/2018   Lacunar infarction ([place]) 03/29/2018   Unstable angina ([place])    Chest pain 01/18/2018   CHF (congestive heart failure) ([place]) 10/21/2017   Anxiety 03/09/2017  Constipation 11/26/2016   History of non-ST elevation myocardial infarction (NSTEMI) 07/29/2016   Diabetes  ([place]) 05/25/2016   Occlusion and stenosis of vertebral artery    Acute renal failure ([employer]) 05/23/2016   Diabetic neuropathy ([place]) 01/13/2016   Depression 11/01/2015   Erectile dysfunction 01/23/2015   Morbid obesity ([place]) 01/23/2015   Periodontal disease 01/23/2015   Stopped smoking with greater than 30 pack year history 01/23/2015   Diabetic peripheral neuropathy associated with type 2 diabetes mellitus ([place]) 11/08/2014   Mucosal abnormality of stomach    History of adenomatous polyp of colon    GERD (gastroesophageal reflux disease) 09/06/2014   Low serum testosterone level 08/01/2014   DDD (degenerative disc disease), cervical 12/20/2013   Headache 08/07/2013   Medically noncompliant 10/25/2011   CAD S/P percutaneous coronary angioplasty 10/25/2011   MURMUR 04/16/2009   Hypertension associated with diabetes ([place]) 08/02/2008   Hyperlipidemia associated with type 2 diabetes mellitus ([place]) 07/31/2008   DISORDERS OF IRON METABOLISM 07/31/2008   Past Medical History:  Diagnosis Date   Anxiety    Arthritis    Atrial fibrillation ([place])    CAD (coronary artery disease)    a. 2010: DES to CTO of RCA. EF 55% b. 07/2016: cath showing total occlusion within previously placed RCA stent (collaterals present), severe stenosis along LCx and OM1 (treated with 2 overlapping DES). c. repeat cath in 01/2018 showing patent stents along LCx and OM with CTO of D2, CTO of distal LCx, and CTO of RCA with collaterals present overall unchanged since 2018 with medical management recom   Cellulitis and abscess rt groin    Complication of anesthesia    " I woke up during a colonoscopy "      Depression    Diabetes mellitus    Diastolic CHF ([place])    Disorders of iron metabolism    Dysrhythmia    Fibromyalgia    GERD (gastroesophageal reflux disease)    History of hiatal hernia    Hyperlipidemia    Hypertension    Low serum testosterone level    Medically noncompliant    Myocardial infarction ([place])     05-23-20   Pneumonia    Past Surgical History:  Procedure Laterality Date   ABDOMINAL AORTOGRAM W/LOWER EXTREMITY N/A 03/21/2019   Procedure: ABDOMINAL AORTOGRAM W/LOWER EXTREMITY;  Surgeon: [name], MD;  Location: [facility];  Service: Cardiovascular;  Laterality: N/A;   ABDOMINAL AORTOGRAM W/LOWER EXTREMITY N/A 11/12/2020   Procedure: ABDOMINAL AORTOGRAM W/LOWER EXTREMITY;  Surgeon: [name], MD;  Location: [facility];  Service: Cardiovascular;  Laterality: N/A;   ABDOMINAL AORTOGRAM W/LOWER EXTREMITY N/A 01/28/2021   Procedure: ABDOMINAL AORTOGRAM W/LOWER EXTREMITY;  Surgeon: [name], MD;  Location: [facility];  Service: Cardiovascular;  Laterality: N/A;   ABDOMINAL AORTOGRAM W/LOWER EXTREMITY Bilateral 07/29/2021   Procedure: ABDOMINAL AORTOGRAM W/LOWER EXTREMITY;  Surgeon: [name], MD;  Location: [facility];  Service: Cardiovascular;  Laterality: Bilateral;   ANGIOPLASTY N/A 08/15/2021   Procedure: ATTEMPTED RIGHT PERONEAL  ANGIOPLASTY, LEFT PERONEAL ANGIOPLASTY;  Surgeon: [name], MD;  Location: [facility];  Service: Vascular;  Laterality: N/A;   BACK SURGERY  2015   ACDF by Dr. Orland Jarred STUDY  12/23/2021   Procedure: BUBBLE STUDY;  Surgeon: Elouise Munroe, MD;  Location: [facility] ENDOSCOPY;  Service: Cardiology;;   COLONOSCOPY N/A 10/01/2014   Dr. [name]: multiple tubular adenomas removed, colonic diverticulosis, redundant colon.  next tcs advised for 09/2017. PATIENT NEEDS PROPOFOL FOR FAILED CONSCIOUS SEDATION   CORONARY STENT INTERVENTION N/A 07/30/2016   Procedure: Coronary Stent Intervention;  Surgeon: [name], MD;  Location: [facility];  Service: Cardiovascular;  Laterality: N/A;   CORONARY STENT PLACEMENT  2000   By Dr. Olevia Perches   EP IMPLANTABLE DEVICE N/A 05/25/2016   Procedure: Loop Recorder Insertion;  Surgeon: [name], MD;  Location: [facility];  Service: Cardiovascular;  Laterality:  N/A;   ESOPHAGOGASTRODUODENOSCOPY     esophagus [facility]   ESOPHAGOGASTRODUODENOSCOPY N/A 10/01/2014   Dr. [name]: patchy mottling/erythema and minimal polypoid appearance of gastric mucosa. bx with mild inlammation but no H.pylori   FEMORAL-POPLITEAL BYPASS GRAFT Left 11/22/2020   Procedure: LEFT FEMORAL-POPLITEAL BYPASS GRAFT;  Surgeon: [name], MD;  [facility];  Service: Vascular;  Laterality: Left;   FEMORAL-POPLITEAL BYPASS GRAFT Left 08/15/2021   Procedure: REDO LEFT FEMORAL-POPLITEAL BYPASS USING PROPATEN GRAFT;  Surgeon: [name], MD;  Location: [facility];  Service: Vascular;  Laterality: Left;   HERNIA REPAIR  6144   umbilical   INSERTION OF ILIAC STENT Right 11/22/2020   Procedure: INSERTION OF ELUVIA STENT INTO RIGHT DISTAL SUPERFICIAL FEMORAL ARTERY;  Surgeon: [name], MD;  Location: [facility];  Service: Vascular;  Laterality: Right;   LEFT HEART CATH AND CORONARY ANGIOGRAPHY N/A 07/30/2016   Procedure: Left Heart Cath and Coronary Angiography;  Surgeon: [name], MD;  Location: [facility];  Service: Cardiovascular;  Laterality: N/A;   LEFT HEART CATH AND CORONARY ANGIOGRAPHY N/A 01/19/2018   Procedure: LEFT HEART CATH AND CORONARY ANGIOGRAPHY;  Surgeon: [name], MD;  Location: [facility];  Service: Cardiovascular;  Laterality: N/A;   LEFT HEART CATH AND CORONARY ANGIOGRAPHY N/A 05/24/2020   Procedure: LEFT HEART CATH AND CORONARY ANGIOGRAPHY;  Surgeon: Burnell Blanks, MD;  Location: [facility];  Service: Cardiovascular;  Laterality: N/A;   LEFT HEART CATH AND CORONARY ANGIOGRAPHY N/A 08/06/2021   Procedure: LEFT HEART CATH AND CORONARY ANGIOGRAPHY;  Surgeon: Burnell Blanks, MD;  Location: [facility];  Service: Cardiovascular;  Laterality: N/A;   LESION REMOVAL     Lip and hand    LOWER EXTREMITY ANGIOGRAM Right 11/22/2020   Procedure: RIGHT LEG ANGIOGRAM;  Surgeon: [name], MD;   [facility];  Service: Vascular;  Laterality: Right;   LOWER EXTREMITY ANGIOGRAM Right 08/15/2021   Procedure: RIGHT LOWER EXTREMITY ANGIOGRAM;  Surgeon: [name], MD;  [facility];  Service: Vascular;  Laterality: Right;   LOWER EXTREMITY ANGIOGRAPHY N/A 04/18/2019   Procedure: LOWER EXTREMITY ANGIOGRAPHY;  Surgeon: [name], MD;  Location: [facility];  Service: Cardiovascular;  Laterality: N/A;   LOWER EXTREMITY ANGIOGRAPHY N/A 08/18/2021   Procedure: Lower Extremity Angiography;  Surgeon: Waynetta Sandy, MD;  Location: [facility];  Service: Cardiovascular;  Laterality: N/A;   NECK SURGERY     PERIPHERAL VASCULAR BALLOON ANGIOPLASTY  04/18/2019   Procedure: PERIPHERAL VASCULAR BALLOON ANGIOPLASTY;  Surgeon: [name], MD;  Location: [facility];  Service: Cardiovascular;;   PERIPHERAL VASCULAR BALLOON ANGIOPLASTY Left 11/12/2020   Procedure: PERIPHERAL VASCULAR BALLOON ANGIOPLASTY;  Surgeon: [name], MD;  Location: [facility];  Service: Cardiovascular;  Laterality: Left;  Failed PTA of superficial femoral artery.   PERIPHERAL VASCULAR BALLOON ANGIOPLASTY Right 08/18/2021   Procedure:  PERIPHERAL VASCULAR BALLOON ANGIOPLASTY;  Surgeon: Waynetta Sandy, MD;  Location: [facility];  Service: Cardiovascular;  Laterality: Right;  peroneal   PERIPHERAL VASCULAR INTERVENTION Right 03/21/2019   Procedure: PERIPHERAL VASCULAR INTERVENTION;  Surgeon: [name], MD;  Location: [facility];  Service: Cardiovascular;  Laterality: Right;  superficial femoral   PERIPHERAL VASCULAR INTERVENTION Right 08/18/2021   Procedure: PERIPHERAL VASCULAR INTERVENTION;  Surgeon: Waynetta Sandy, MD;  Location: [facility];  Service: Cardiovascular;  Laterality: Right;  tibial peroneal trunk   TEE WITHOUT CARDIOVERSION N/A 12/23/2021   Procedure: TRANSESOPHAGEAL ECHOCARDIOGRAM (TEE);  Surgeon: Elouise Munroe, MD;  Location: [facility];  Service: Cardiology;  Laterality: N/A;   Social History   Tobacco Use   Smoking status: Former    Packs/day: 0.25    Years: 51.00    Total pack years: 12.75    Types: Cigarettes    Quit date: 08/14/2016    Years since quitting: 5.4   Smokeless tobacco: Never   Tobacco comments:    smokes  a pack a week  Vaping Use   Vaping Use: Never used  Substance Use Topics   Alcohol use: No    Alcohol/week: 0.0 standard drinks of alcohol   Drug use: No   Social History   Socioeconomic History   Marital status: Married    Spouse name: Belenda Cruise   Number of children: 2   Years of education: 12   Highest education level: 12th grade  Occupational History   Occupation: retired  Tobacco Use   Smoking status: Former    Packs/day: 0.25    Years: 51.00    Total pack years: 12.75    Types: Cigarettes    Quit date: 08/14/2016    Years since quitting: 5.4   Smokeless tobacco: Never   Tobacco comments:    smokes  a pack a week  Vaping Use   Vaping Use: Never used  Substance and Sexual Activity   Alcohol use: No    Alcohol/week: 0.0 standard drinks of alcohol   Drug use: No   Sexual activity: Yes  Other Topics Concern   Not on file  Social History Narrative   Lives with his wife.  Retired.  Unable to afford expensive medicines.     He has 2 children from previous marriage - they live in [place]       Caffeine: 1 cup of 1/2 caff coffee in AM, drinks more if at a restaurant    Social Determinants of Health   Financial Resource Strain: Medium Risk (01/06/2021)   Overall Financial Resource Strain (CARDIA)    Difficulty of Paying Living Expenses: Somewhat hard  Food Insecurity: No Food Insecurity (01/06/2021)   Hunger Vital Sign    Worried About Running Out of Food in the Last Year: Never true    [place] in the Last Year: Never true  Transportation Needs: No Transportation Needs (01/06/2021)   PRAPARE - Radiographer, therapeutic (Medical): No    Lack of Transportation (Non-Medical): No  Physical Activity: Insufficiently Active (01/06/2021)   Exercise Vital Sign    Days of Exercise per Week: 7 days    Minutes of Exercise per Session: 10 min  Stress: No Stress Concern Present (01/06/2021)   [place]    Feeling of Stress : Only a little  Social Connections: Moderately Isolated (01/06/2021)   Social Connection and  Isolation Panel [NHANES]    Frequency of Communication with Friends and Family: More than three times a week    Frequency of Social Gatherings with Friends and Family: Twice a week    Attends Religious Services: Never    Marine scientist or Organizations: No    Attends Archivist Meetings: Never    Marital Status: Married  Human resources officer Violence: Not At Risk (01/06/2021)   Humiliation, Afraid, Rape, and Kick questionnaire    Fear of Current or Ex-Partner: No    Emotionally Abused: No    Physically Abused: No    Sexually Abused: No   Family Status  Relation Name Status   Father  Alive   Mother Bessie Deceased   Sister Fraser Din Alive   Sister Pamala Hurry Alive   Mat Aunt  (Not Specified)   Mat Uncle  (Not Specified)   Psychiatrist  (Not Specified)   Neg Hx  (Not Specified)   Family History  Problem Relation Age of Onset   Diabetes Father    Valvular heart disease Father    Arthritis Father    Heart disease Father    Stroke Father    Alzheimer's disease Mother    Hyperlipidemia Mother    Hypertension Mother    Arthritis Mother    Lung cancer Mother    Stroke Mother    Headache Mother    Arthritis/Rheumatoid Sister    Diabetes Sister    Hypertension Sister    Hyperlipidemia Sister    Depression Sister    Dementia Maternal Aunt    Dementia Maternal Uncle    Heart disease Maternal Uncle    Stomach cancer Paternal Uncle    Colon cancer Neg Hx    Liver disease Neg Hx    Allergies  Allergen  Reactions   Shellfish Allergy Anaphylaxis and Other (See Comments)    Tongue swelling, hives    Sulfa Antibiotics Anaphylaxis and Rash    Tongue swelling, hives   Ace Inhibitors Other (See Comments) and Cough    CKD, renal failure    Escitalopram Other (See Comments)    Buzzing in ears,headache, felt like a zombie    Evolocumab Other (See Comments)    Myalgias, flu like sx    Fenofibrate Other (See Comments)    Body aches - pt currently taking isnt sure if its causing any pain    Invokana [Canagliflozin] Other (See Comments)    Syncope / dehydration   Lisinopril Cough   Metformin And Related Itching   Pravastatin Sodium Other (See Comments)    myalgias   Crestor [Rosuvastatin] Other (See Comments)    Myalgias    Horse-Derived Products Rash    horse serum   Lexapro [Escitalopram Oxalate] Other (See Comments)    Buzzing in ears,headache, felt like a zombie   Lipitor [Atorvastatin] Other (See Comments)    myalgias   Livalo [Pitavastatin] Other (See Comments)    Myalgias    Milk (Cow) Nausea Only    Ties stomach in knots     Tape Rash      Review of Systems  Constitutional: Negative.  Negative for chills and fever.  HENT: Negative.    Respiratory: Negative.    Cardiovascular: Negative.   Genitourinary: Negative.   Skin: Negative.        wound  All other systems reviewed and are negative.     Objective:     BP 122/64   Pulse 92   Temp 97.8 F (  36.6 C) (Temporal)   Ht '6\' 2"'$  (1.88 m)   SpO2 96%   BMI 34.67 kg/m  BP Readings from Last 3 Encounters:  01/20/22 122/64  01/20/22 (!) 149/100  01/09/22 (!) 118/57   Wt Readings from Last 3 Encounters:  01/09/22 270 lb (122.5 kg)  12/26/21 281 lb 3.2 oz (127.6 kg)  12/23/21 280 lb (127 kg)      Physical Exam Vitals and nursing note reviewed. Exam conducted with a chaperone present (spouse).  Constitutional:      Appearance: He is obese.  HENT:     Head: Normocephalic.     Right Ear: External ear  normal.     Left Ear: External ear normal.     Nose: Nose normal.     Mouth/Throat:     Mouth: Mucous membranes are moist.     Pharynx: Oropharynx is clear.  Eyes:     Conjunctiva/sclera: Conjunctivae normal.  Cardiovascular:     Rate and Rhythm: Normal rate.     Pulses: Normal pulses.     Heart sounds: Normal heart sounds.  Pulmonary:     Effort: Pulmonary effort is normal.     Breath sounds: Normal breath sounds.  Abdominal:     General: Bowel sounds are normal.  Skin:    General: Skin is warm.     Findings: Erythema present.  Neurological:     Mental Status: He is alert and oriented to person, place, and time.      Results for orders placed or performed during the hospital encounter of 01/20/22  Blood Culture (routine x 2)   Specimen: BLOOD  Result Value Ref Range   Specimen Description BLOOD RAC    Special Requests      BOTTLES DRAWN AEROBIC AND ANAEROBIC Blood Culture adequate volume   Culture      NO GROWTH < 12 HOURS Performed at [facility], [address] [place], [place] [address]    Report Status PENDING   Blood Culture (routine x 2)   Specimen: BLOOD  Result Value Ref Range   Specimen Description BLOOD RFOA    Special Requests      BOTTLES DRAWN AEROBIC AND ANAEROBIC Blood Culture adequate volume   Culture      NO GROWTH < 12 HOURS Performed at [facility], [address] [place], [place] [address]    Report Status PENDING   SARS Coronavirus 2 by RT PCR (hospital order, performed in [place] hospital lab) *cepheid single result test* Urine, Clean Catch   Specimen: Urine, Clean Catch; Nasal Swab  Result Value Ref Range   SARS Coronavirus 2 by RT PCR NEGATIVE NEGATIVE  Lactic acid, plasma  Result Value Ref Range   Lactic Acid, Venous 1.8 0.5 - 1.9 mmol/L  Lactic acid, plasma  Result Value Ref Range   Lactic Acid, Venous 1.4 0.5 - 1.9 mmol/L  Comprehensive metabolic panel  Result Value Ref Range   Sodium 136 135 - 145 mmol/L   Potassium 4.3  3.5 - 5.1 mmol/L   Chloride 97 (L) 98 - 111 mmol/L   CO2 28 22 - 32 mmol/L   Glucose, Bld 249 (H) 70 - 99 mg/dL   BUN 24 (H) 8 - 23 mg/dL   Creatinine, Ser 1.57 (H) 0.61 - 1.24 mg/dL   Calcium 9.4 8.9 - 10.3 mg/dL   Total Protein 8.1 6.5 - 8.1 g/dL   Albumin 4.0 3.5 - 5.0 g/dL   AST 20 15 - 41 U/L  ALT 15 0 - 44 U/L   Alkaline Phosphatase 48 38 - 126 U/L   Total Bilirubin 1.1 0.3 - 1.2 mg/dL   GFR, Estimated 47 (L) >60 mL/min   Anion gap 11 5 - 15  CBC with Differential  Result Value Ref Range   WBC 10.4 4.0 - 10.5 K/uL   RBC 4.80 4.22 - 5.81 MIL/uL   Hemoglobin 11.1 (L) 13.0 - 17.0 g/dL   HCT 37.8 (L) 39.0 - 52.0 %   MCV 78.8 (L) 80.0 - 100.0 fL   MCH 23.1 (L) 26.0 - 34.0 pg   MCHC 29.4 (L) 30.0 - 36.0 g/dL   RDW 15.9 (H) 11.5 - 15.5 %   Platelets 301 150 - 400 K/uL   nRBC 0.0 0.0 - 0.2 %   Neutrophils Relative % 81 %   Neutro Abs 8.5 (H) 1.7 - 7.7 K/uL   Lymphocytes Relative 10 %   Lymphs Abs 1.0 0.7 - 4.0 K/uL   Monocytes Relative 6 %   Monocytes Absolute 0.6 0.1 - 1.0 K/uL   Eosinophils Relative 1 %   Eosinophils Absolute 0.1 0.0 - 0.5 K/uL   Basophils Relative 1 %   Basophils Absolute 0.1 0.0 - 0.1 K/uL   Immature Granulocytes 1 %   Abs Immature Granulocytes 0.05 0.00 - 0.07 K/uL  Protime-INR  Result Value Ref Range   Prothrombin Time 16.6 (H) 11.4 - 15.2 seconds   INR 1.4 (H) 0.8 - 1.2  APTT  Result Value Ref Range   aPTT 49 (H) 24 - 36 seconds  Urinalysis, Routine w reflex microscopic Urine, Clean Catch  Result Value Ref Range   Color, Urine YELLOW YELLOW   APPearance CLEAR CLEAR   Specific Gravity, Urine 1.027 1.005 - 1.030   pH 5.0 5.0 - 8.0   Glucose, UA >=500 (A) NEGATIVE mg/dL   Hgb urine dipstick NEGATIVE NEGATIVE   Bilirubin Urine NEGATIVE NEGATIVE   Ketones, ur NEGATIVE NEGATIVE mg/dL   Protein, ur NEGATIVE NEGATIVE mg/dL   Nitrite NEGATIVE NEGATIVE   Leukocytes,Ua NEGATIVE NEGATIVE   RBC / HPF 0-5 0 - 5 RBC/hpf   WBC, UA 0-5 0 - 5 WBC/hpf    Bacteria, UA NONE SEEN NONE SEEN   Squamous Epithelial / LPF 0-5 0 - 5  Brain natriuretic peptide  Result Value Ref Range   B Natriuretic Peptide 267.0 (H) 0.0 - 100.0 pg/mL  Troponin I (High Sensitivity)  Result Value Ref Range   Troponin I (High Sensitivity) 42 (H) <18 ng/L  Troponin I (High Sensitivity)  Result Value Ref Range   Troponin I (High Sensitivity) 75 (H) <18 ng/L    Last CBC Lab Results  Component Value Date   WBC 10.4 01/20/2022   HGB 11.1 (L) 01/20/2022   HCT 37.8 (L) 01/20/2022   MCV 78.8 (L) 01/20/2022   MCH 23.1 (L) 01/20/2022   RDW 15.9 (H) 01/20/2022   PLT 301 63/78/5885   Last metabolic panel Lab Results  Component Value Date   GLUCOSE 249 (H) 01/20/2022   NA 136 01/20/2022   K 4.3 01/20/2022   CL 97 (L) 01/20/2022   CO2 28 01/20/2022   BUN 24 (H) 01/20/2022   CREATININE 1.57 (H) 01/20/2022   GFRNONAA 47 (L) 01/20/2022   CALCIUM 9.4 01/20/2022   PHOS 2.4 (L) 01/20/2018   PROT 8.1 01/20/2022   ALBUMIN 4.0 01/20/2022   LABGLOB 3.2 12/26/2021   AGRATIO 1.4 12/26/2021   BILITOT 1.1 01/20/2022   ALKPHOS 48  01/20/2022   AST 20 01/20/2022   ALT 15 01/20/2022   ANIONGAP 11 01/20/2022      The ASCVD Risk score (Arnett DK, et al., 2019) failed to calculate for the following reasons:   The patient has a prior MI or stroke diagnosis    Assessment & Plan:   Alert, oriented, no acute distress  LESION DESCRIPTION: erythema and swelling  ASSOCIATED SIGNS: none  SYSTEMIC SYMPTOMS: none  PULSES: peripheral pulses symmetrical  CAPILLARY REFILL: Normal  Advised patient to start antibiotics as prescribed from the ED,  ED visit summery overview with patient/ medication reconciliation. provided reassurance to patient due to his concerns that this may turn into sepsis. Patient knows to return if symptoms are worse in 24-48 hours. For fever and pain use tylenol as needed.    Problem List Items Addressed This Visit   None Visit Diagnoses      Cellulitis of left lower extremity    -  Primary       Return if symptoms worsen or fail to improve.    Ivy Lynn, NP

## 2022-01-20 NOTE — ED Triage Notes (Signed)
Pt arrived from home w c/o wound infection on left foot.

## 2022-01-21 ENCOUNTER — Telehealth (INDEPENDENT_AMBULATORY_CARE_PROVIDER_SITE_OTHER): Payer: Medicare Other | Admitting: Infectious Diseases

## 2022-01-21 ENCOUNTER — Other Ambulatory Visit: Payer: Self-pay

## 2022-01-21 ENCOUNTER — Telehealth: Payer: Self-pay | Admitting: Family Medicine

## 2022-01-21 ENCOUNTER — Encounter (HOSPITAL_COMMUNITY): Payer: Self-pay | Admitting: Emergency Medicine

## 2022-01-21 ENCOUNTER — Emergency Department (HOSPITAL_COMMUNITY): Payer: Medicare Other

## 2022-01-21 ENCOUNTER — Inpatient Hospital Stay (HOSPITAL_COMMUNITY)
Admission: EM | Admit: 2022-01-21 | Discharge: 2022-01-26 | DRG: 299 | Disposition: A | Discharge status: Home / Self Care | Payer: Medicare Other | Attending: Family Medicine | Admitting: Family Medicine

## 2022-01-21 DIAGNOSIS — R112 Nausea with vomiting, unspecified: Secondary | ICD-10-CM | POA: Diagnosis not present

## 2022-01-21 DIAGNOSIS — D509 Iron deficiency anemia, unspecified: Secondary | ICD-10-CM | POA: Diagnosis present

## 2022-01-21 DIAGNOSIS — E876 Hypokalemia: Secondary | ICD-10-CM | POA: Diagnosis present

## 2022-01-21 DIAGNOSIS — B952 Enterococcus as the cause of diseases classified elsewhere: Secondary | ICD-10-CM | POA: Diagnosis present

## 2022-01-21 DIAGNOSIS — I130 Hypertensive heart and chronic kidney disease with heart failure and stage 1 through stage 4 chronic kidney disease, or unspecified chronic kidney disease: Secondary | ICD-10-CM | POA: Diagnosis present

## 2022-01-21 DIAGNOSIS — N179 Acute kidney failure, unspecified: Secondary | ICD-10-CM

## 2022-01-21 DIAGNOSIS — I509 Heart failure, unspecified: Secondary | ICD-10-CM

## 2022-01-21 DIAGNOSIS — E11622 Type 2 diabetes mellitus with other skin ulcer: Secondary | ICD-10-CM | POA: Diagnosis not present

## 2022-01-21 DIAGNOSIS — E871 Hypo-osmolality and hyponatremia: Secondary | ICD-10-CM | POA: Diagnosis present

## 2022-01-21 DIAGNOSIS — J9601 Acute respiratory failure with hypoxia: Secondary | ICD-10-CM | POA: Diagnosis present

## 2022-01-21 DIAGNOSIS — F32A Depression, unspecified: Secondary | ICD-10-CM | POA: Diagnosis present

## 2022-01-21 DIAGNOSIS — Z20822 Contact with and (suspected) exposure to covid-19: Secondary | ICD-10-CM | POA: Diagnosis not present

## 2022-01-21 DIAGNOSIS — L89312 Pressure ulcer of right buttock, stage 2: Secondary | ICD-10-CM | POA: Diagnosis not present

## 2022-01-21 DIAGNOSIS — L89322 Pressure ulcer of left buttock, stage 2: Secondary | ICD-10-CM | POA: Diagnosis present

## 2022-01-21 DIAGNOSIS — Z801 Family history of malignant neoplasm of trachea, bronchus and lung: Secondary | ICD-10-CM

## 2022-01-21 DIAGNOSIS — R1310 Dysphagia, unspecified: Secondary | ICD-10-CM | POA: Diagnosis present

## 2022-01-21 DIAGNOSIS — E1169 Type 2 diabetes mellitus with other specified complication: Secondary | ICD-10-CM | POA: Diagnosis not present

## 2022-01-21 DIAGNOSIS — I5040 Unspecified combined systolic (congestive) and diastolic (congestive) heart failure: Secondary | ICD-10-CM | POA: Diagnosis not present

## 2022-01-21 DIAGNOSIS — Z6836 Body mass index (BMI) 36.0-36.9, adult: Secondary | ICD-10-CM

## 2022-01-21 DIAGNOSIS — F419 Anxiety disorder, unspecified: Secondary | ICD-10-CM | POA: Diagnosis present

## 2022-01-21 DIAGNOSIS — E1152 Type 2 diabetes mellitus with diabetic peripheral angiopathy with gangrene: Secondary | ICD-10-CM | POA: Diagnosis not present

## 2022-01-21 DIAGNOSIS — R7881 Bacteremia: Secondary | ICD-10-CM | POA: Diagnosis not present

## 2022-01-21 DIAGNOSIS — E1142 Type 2 diabetes mellitus with diabetic polyneuropathy: Secondary | ICD-10-CM | POA: Diagnosis not present

## 2022-01-21 DIAGNOSIS — Z91011 Allergy to milk products: Secondary | ICD-10-CM

## 2022-01-21 DIAGNOSIS — R509 Fever, unspecified: Principal | ICD-10-CM

## 2022-01-21 DIAGNOSIS — I779 Disorder of arteries and arterioles, unspecified: Secondary | ICD-10-CM | POA: Diagnosis not present

## 2022-01-21 DIAGNOSIS — Z7902 Long term (current) use of antithrombotics/antiplatelets: Secondary | ICD-10-CM

## 2022-01-21 DIAGNOSIS — I5020 Unspecified systolic (congestive) heart failure: Secondary | ICD-10-CM | POA: Diagnosis not present

## 2022-01-21 DIAGNOSIS — K759 Inflammatory liver disease, unspecified: Secondary | ICD-10-CM | POA: Diagnosis present

## 2022-01-21 DIAGNOSIS — K8020 Calculus of gallbladder without cholecystitis without obstruction: Secondary | ICD-10-CM | POA: Diagnosis present

## 2022-01-21 DIAGNOSIS — Z794 Long term (current) use of insulin: Secondary | ICD-10-CM | POA: Diagnosis not present

## 2022-01-21 DIAGNOSIS — L97522 Non-pressure chronic ulcer of other part of left foot with fat layer exposed: Secondary | ICD-10-CM | POA: Diagnosis not present

## 2022-01-21 DIAGNOSIS — I50813 Acute on chronic right heart failure: Secondary | ICD-10-CM | POA: Diagnosis not present

## 2022-01-21 DIAGNOSIS — Z833 Family history of diabetes mellitus: Secondary | ICD-10-CM

## 2022-01-21 DIAGNOSIS — E0842 Diabetes mellitus due to underlying condition with diabetic polyneuropathy: Secondary | ICD-10-CM

## 2022-01-21 DIAGNOSIS — D500 Iron deficiency anemia secondary to blood loss (chronic): Secondary | ICD-10-CM | POA: Diagnosis not present

## 2022-01-21 DIAGNOSIS — K8050 Calculus of bile duct without cholangitis or cholecystitis without obstruction: Secondary | ICD-10-CM | POA: Diagnosis not present

## 2022-01-21 DIAGNOSIS — L97319 Non-pressure chronic ulcer of right ankle with unspecified severity: Secondary | ICD-10-CM | POA: Diagnosis present

## 2022-01-21 DIAGNOSIS — K219 Gastro-esophageal reflux disease without esophagitis: Secondary | ICD-10-CM | POA: Diagnosis not present

## 2022-01-21 DIAGNOSIS — D72828 Other elevated white blood cell count: Secondary | ICD-10-CM

## 2022-01-21 DIAGNOSIS — E1140 Type 2 diabetes mellitus with diabetic neuropathy, unspecified: Secondary | ICD-10-CM | POA: Diagnosis not present

## 2022-01-21 DIAGNOSIS — I2510 Atherosclerotic heart disease of native coronary artery without angina pectoris: Secondary | ICD-10-CM | POA: Diagnosis not present

## 2022-01-21 DIAGNOSIS — A419 Sepsis, unspecified organism: Secondary | ICD-10-CM | POA: Diagnosis not present

## 2022-01-21 DIAGNOSIS — N1831 Chronic kidney disease, stage 3a: Secondary | ICD-10-CM | POA: Diagnosis present

## 2022-01-21 DIAGNOSIS — I248 Other forms of acute ischemic heart disease: Secondary | ICD-10-CM | POA: Diagnosis not present

## 2022-01-21 DIAGNOSIS — Z7189 Other specified counseling: Secondary | ICD-10-CM

## 2022-01-21 DIAGNOSIS — K810 Acute cholecystitis: Secondary | ICD-10-CM

## 2022-01-21 DIAGNOSIS — E1165 Type 2 diabetes mellitus with hyperglycemia: Secondary | ICD-10-CM | POA: Diagnosis not present

## 2022-01-21 DIAGNOSIS — R7989 Other specified abnormal findings of blood chemistry: Secondary | ICD-10-CM

## 2022-01-21 DIAGNOSIS — I255 Ischemic cardiomyopathy: Secondary | ICD-10-CM | POA: Diagnosis present

## 2022-01-21 DIAGNOSIS — R531 Weakness: Secondary | ICD-10-CM

## 2022-01-21 DIAGNOSIS — Z800 Family history of malignant neoplasm of digestive organs: Secondary | ICD-10-CM

## 2022-01-21 DIAGNOSIS — G9600 Cerebrospinal fluid leak, unspecified: Secondary | ICD-10-CM | POA: Diagnosis not present

## 2022-01-21 DIAGNOSIS — S81801D Unspecified open wound, right lower leg, subsequent encounter: Secondary | ICD-10-CM | POA: Diagnosis not present

## 2022-01-21 DIAGNOSIS — I96 Gangrene, not elsewhere classified: Secondary | ICD-10-CM

## 2022-01-21 DIAGNOSIS — Z955 Presence of coronary angioplasty implant and graft: Secondary | ICD-10-CM

## 2022-01-21 DIAGNOSIS — E669 Obesity, unspecified: Secondary | ICD-10-CM | POA: Diagnosis present

## 2022-01-21 DIAGNOSIS — E872 Acidosis, unspecified: Secondary | ICD-10-CM | POA: Diagnosis not present

## 2022-01-21 DIAGNOSIS — I482 Chronic atrial fibrillation, unspecified: Secondary | ICD-10-CM | POA: Diagnosis present

## 2022-01-21 DIAGNOSIS — I25118 Atherosclerotic heart disease of native coronary artery with other forms of angina pectoris: Secondary | ICD-10-CM | POA: Diagnosis present

## 2022-01-21 DIAGNOSIS — Z79899 Other long term (current) drug therapy: Secondary | ICD-10-CM

## 2022-01-21 DIAGNOSIS — I739 Peripheral vascular disease, unspecified: Secondary | ICD-10-CM | POA: Diagnosis not present

## 2022-01-21 DIAGNOSIS — I252 Old myocardial infarction: Secondary | ICD-10-CM

## 2022-01-21 DIAGNOSIS — Z8249 Family history of ischemic heart disease and other diseases of the circulatory system: Secondary | ICD-10-CM

## 2022-01-21 DIAGNOSIS — I5042 Chronic combined systolic (congestive) and diastolic (congestive) heart failure: Secondary | ICD-10-CM | POA: Diagnosis present

## 2022-01-21 DIAGNOSIS — I5021 Acute systolic (congestive) heart failure: Secondary | ICD-10-CM | POA: Diagnosis not present

## 2022-01-21 DIAGNOSIS — K7200 Acute and subacute hepatic failure without coma: Secondary | ICD-10-CM | POA: Diagnosis not present

## 2022-01-21 DIAGNOSIS — I4510 Unspecified right bundle-branch block: Secondary | ICD-10-CM | POA: Diagnosis present

## 2022-01-21 DIAGNOSIS — I70244 Atherosclerosis of native arteries of left leg with ulceration of heel and midfoot: Secondary | ICD-10-CM | POA: Diagnosis not present

## 2022-01-21 DIAGNOSIS — Z91013 Allergy to seafood: Secondary | ICD-10-CM

## 2022-01-21 DIAGNOSIS — R6510 Systemic inflammatory response syndrome (SIRS) of non-infectious origin without acute organ dysfunction: Secondary | ICD-10-CM | POA: Diagnosis not present

## 2022-01-21 DIAGNOSIS — Z823 Family history of stroke: Secondary | ICD-10-CM

## 2022-01-21 DIAGNOSIS — R778 Other specified abnormalities of plasma proteins: Secondary | ICD-10-CM | POA: Diagnosis not present

## 2022-01-21 DIAGNOSIS — F17210 Nicotine dependence, cigarettes, uncomplicated: Secondary | ICD-10-CM | POA: Diagnosis present

## 2022-01-21 DIAGNOSIS — Z9861 Coronary angioplasty status: Secondary | ICD-10-CM | POA: Diagnosis not present

## 2022-01-21 DIAGNOSIS — L03116 Cellulitis of left lower limb: Secondary | ICD-10-CM | POA: Diagnosis present

## 2022-01-21 DIAGNOSIS — E1122 Type 2 diabetes mellitus with diabetic chronic kidney disease: Secondary | ICD-10-CM | POA: Diagnosis present

## 2022-01-21 DIAGNOSIS — M797 Fibromyalgia: Secondary | ICD-10-CM | POA: Diagnosis present

## 2022-01-21 DIAGNOSIS — L97911 Non-pressure chronic ulcer of unspecified part of right lower leg limited to breakdown of skin: Secondary | ICD-10-CM | POA: Diagnosis not present

## 2022-01-21 DIAGNOSIS — I480 Paroxysmal atrial fibrillation: Secondary | ICD-10-CM | POA: Diagnosis not present

## 2022-01-21 DIAGNOSIS — S81802D Unspecified open wound, left lower leg, subsequent encounter: Secondary | ICD-10-CM | POA: Diagnosis not present

## 2022-01-21 DIAGNOSIS — E785 Hyperlipidemia, unspecified: Secondary | ICD-10-CM | POA: Diagnosis not present

## 2022-01-21 DIAGNOSIS — E119 Type 2 diabetes mellitus without complications: Secondary | ICD-10-CM

## 2022-01-21 DIAGNOSIS — I5023 Acute on chronic systolic (congestive) heart failure: Secondary | ICD-10-CM | POA: Diagnosis not present

## 2022-01-21 DIAGNOSIS — Z229 Carrier of infectious disease, unspecified: Secondary | ICD-10-CM | POA: Insufficient documentation

## 2022-01-21 DIAGNOSIS — Z882 Allergy status to sulfonamides status: Secondary | ICD-10-CM

## 2022-01-21 DIAGNOSIS — Z7901 Long term (current) use of anticoagulants: Secondary | ICD-10-CM

## 2022-01-21 LAB — APTT: aPTT: 50 s — ABNORMAL HIGH (ref 24–36)

## 2022-01-21 LAB — CBC WITH DIFFERENTIAL/PLATELET
Abs Immature Granulocytes: 0.14 10*3/uL — ABNORMAL HIGH (ref 0.00–0.07)
Basophils Absolute: 0.1 10*3/uL (ref 0.0–0.1)
Basophils Relative: 0 %
Eosinophils Absolute: 0 10*3/uL (ref 0.0–0.5)
Eosinophils Relative: 0 %
HCT: 38.2 % — ABNORMAL LOW (ref 39.0–52.0)
Hemoglobin: 11.5 g/dL — ABNORMAL LOW (ref 13.0–17.0)
Immature Granulocytes: 1 %
Lymphocytes Relative: 5 %
Lymphs Abs: 1 10*3/uL (ref 0.7–4.0)
MCH: 23.4 pg — ABNORMAL LOW (ref 26.0–34.0)
MCHC: 30.1 g/dL (ref 30.0–36.0)
MCV: 77.6 fL — ABNORMAL LOW (ref 80.0–100.0)
Monocytes Absolute: 1.5 10*3/uL — ABNORMAL HIGH (ref 0.1–1.0)
Monocytes Relative: 7 %
Neutro Abs: 18.1 10*3/uL — ABNORMAL HIGH (ref 1.7–7.7)
Neutrophils Relative %: 87 %
Platelets: 348 10*3/uL (ref 150–400)
RBC: 4.92 MIL/uL (ref 4.22–5.81)
RDW: 16.3 % — ABNORMAL HIGH (ref 11.5–15.5)
WBC: 20.9 10*3/uL — ABNORMAL HIGH (ref 4.0–10.5)
nRBC: 0 % (ref 0.0–0.2)

## 2022-01-21 LAB — LACTIC ACID, PLASMA
Lactic Acid, Venous: 3 mmol/L (ref 0.5–1.9)
Lactic Acid, Venous: 3.1 mmol/L (ref 0.5–1.9)

## 2022-01-21 LAB — COMPREHENSIVE METABOLIC PANEL
ALT: 139 U/L — ABNORMAL HIGH (ref 0–44)
AST: 243 U/L — ABNORMAL HIGH (ref 15–41)
Albumin: 4.1 g/dL (ref 3.5–5.0)
Alkaline Phosphatase: 50 U/L (ref 38–126)
Anion gap: 13 (ref 5–15)
BUN: 28 mg/dL — ABNORMAL HIGH (ref 8–23)
CO2: 24 mmol/L (ref 22–32)
Calcium: 9.5 mg/dL (ref 8.9–10.3)
Chloride: 95 mmol/L — ABNORMAL LOW (ref 98–111)
Creatinine, Ser: 1.91 mg/dL — ABNORMAL HIGH (ref 0.61–1.24)
GFR, Estimated: 37 mL/min — ABNORMAL LOW (ref >60)
Glucose, Bld: 184 mg/dL — ABNORMAL HIGH (ref 70–99)
Potassium: 4.1 mmol/L (ref 3.5–5.1)
Sodium: 132 mmol/L — ABNORMAL LOW (ref 135–145)
Total Bilirubin: 1.6 mg/dL — ABNORMAL HIGH (ref 0.3–1.2)
Total Protein: 8.7 g/dL — ABNORMAL HIGH (ref 6.5–8.1)

## 2022-01-21 LAB — PROTIME-INR
INR: 2 — ABNORMAL HIGH (ref 0.8–1.2)
Prothrombin Time: 22.3 seconds — ABNORMAL HIGH (ref 11.4–15.2)

## 2022-01-21 LAB — LIPASE, BLOOD: Lipase: 21 U/L (ref 11–51)

## 2022-01-21 MED ORDER — VANCOMYCIN HCL 2000 MG/400ML IV SOLN
2000.0000 mg | Freq: Once | INTRAVENOUS | Status: DC
  Filled 2022-01-21: qty 400

## 2022-01-21 MED ORDER — VANCOMYCIN HCL 1250 MG/250ML IV SOLN: 1250.0000 mg | INTRAVENOUS | Status: DC

## 2022-01-21 MED ORDER — VANCOMYCIN HCL 2000 MG/400ML IV SOLN
2000.0000 mg | Freq: Once | INTRAVENOUS | Status: AC
  Administered 2022-01-21: 2000 mg via INTRAVENOUS
  Filled 2022-01-21: qty 400

## 2022-01-21 MED ORDER — METRONIDAZOLE 500 MG/100ML IV SOLN
500.0000 mg | Freq: Once | INTRAVENOUS | Status: AC
  Administered 2022-01-21: 500 mg via INTRAVENOUS
  Filled 2022-01-21: qty 100

## 2022-01-21 MED ORDER — VANCOMYCIN HCL 1250 MG/250ML IV SOLN
1250.0000 mg | INTRAVENOUS | Status: DC
  Administered 2022-01-23: 1250 mg via INTRAVENOUS
  Filled 2022-01-21 (×2): qty 250

## 2022-01-21 MED ORDER — LACTATED RINGERS IV BOLUS
1500.0000 mL | Freq: Once | INTRAVENOUS | Status: AC
  Administered 2022-01-22: 1500 mL via INTRAVENOUS

## 2022-01-21 MED ORDER — LACTATED RINGERS IV BOLUS (SEPSIS)
1000.0000 mL | Freq: Once | INTRAVENOUS | Status: AC
  Administered 2022-01-21: 1000 mL via INTRAVENOUS

## 2022-01-21 MED ORDER — MORPHINE SULFATE (PF) 4 MG/ML IV SOLN
4.0000 mg | Freq: Once | INTRAVENOUS | Status: AC
  Administered 2022-01-22: 4 mg via INTRAVENOUS
  Filled 2022-01-21: qty 1

## 2022-01-21 MED ORDER — SODIUM CHLORIDE 0.9 % IV SOLN: 2.0000 g | Freq: Two times a day (BID) | INTRAVENOUS | Status: DC

## 2022-01-21 MED ORDER — SODIUM CHLORIDE 0.9 % IV SOLN
2.0000 g | Freq: Once | INTRAVENOUS | Status: AC
  Administered 2022-01-21: 2 g via INTRAVENOUS
  Filled 2022-01-21: qty 12.5

## 2022-01-21 MED ORDER — SODIUM CHLORIDE 0.9 % IV BOLUS
1000.0000 mL | Freq: Once | INTRAVENOUS | Status: AC
  Administered 2022-01-21: 1000 mL via INTRAVENOUS

## 2022-01-21 MED ORDER — VANCOMYCIN HCL IN DEXTROSE 1-5 GM/200ML-% IV SOLN: 1000.0000 mg | Freq: Once | INTRAVENOUS | Status: DC

## 2022-01-21 MED ORDER — SODIUM CHLORIDE 0.9 % IV SOLN
2.0000 g | Freq: Two times a day (BID) | INTRAVENOUS | Status: DC
  Administered 2022-01-22 – 2022-01-23 (×3): 2 g via INTRAVENOUS
  Filled 2022-01-21 (×3): qty 12.5

## 2022-01-21 MED ORDER — ONDANSETRON HCL 4 MG/2ML IJ SOLN
4.0000 mg | Freq: Once | INTRAMUSCULAR | Status: AC
  Administered 2022-01-22: 4 mg via INTRAVENOUS
  Filled 2022-01-21: qty 2

## 2022-01-21 MED ORDER — IOHEXOL 300 MG/ML  SOLN
85.0000 mL | Freq: Once | INTRAMUSCULAR | Status: AC | PRN
  Administered 2022-01-21: 85 mL via INTRAVENOUS

## 2022-01-21 NOTE — Sepsis Progress Note (Signed)
Following per sepsis protocol   

## 2022-01-21 NOTE — H&P (Signed)
History and Physical    Patient: [name] [name] LXB:[id number] DOB: 30-Jan-1951 DOA: 01/21/2022 DOS: the patient was seen and examined on 01/21/2022 PCP: [name], FNP  Patient coming from: Home  Chief Complaint:  Chief Complaint  Patient presents with   Weakness   HPI: [name] [name] is a 71 y.o. male with medical history significant of coronary artery disease, paroxysmal atrial fibrillation, ischemic cardiomyopathy with the last EF of 55%, depression, diabetes, morbid obesity, fibromyalgia, GERD, essential hypertension, hyperlipidemia among other things who has had previous sepsis and bacteremia as well as severe peripheral vascular disease with left foot dry gangrene which according to the wife is actually getting better,.  Patient was seen yesterday at [facility] with fever chills and met sepsis criteria.  He was presumed to have lower extremity cellulitis as small amount of pus was seen from one of the areas earlier in the week.  That looks much better now.  At that time his temperature was more than 100.  He was discharged home but that its came back again today.  He is feeling fever chills and generally unwell.  Wife reported that this been going on for a few days.  He now reports upper abdominal pain with nausea.  No melena no bright red blood per rectum.  No dysuria.  Initial evaluation here with suspected cholecystitis.  Patient being admitted to the hospital for further evaluation and treatment.  Review of Systems: As mentioned in the history of present illness. All other systems reviewed and are negative. Past Medical History:  Diagnosis Date   Anxiety    Arthritis    Atrial fibrillation (HCC)    CAD (coronary artery disease)    a. 2010: DES to CTO of RCA. EF 55% b. 07/2016: cath showing total occlusion within previously placed RCA stent (collaterals present), severe stenosis along LCx and OM1 (treated with 2 overlapping DES). c. repeat cath in 01/2018 showing patent stents  along LCx and OM with CTO of D2, CTO of distal LCx, and CTO of RCA with collaterals present overall unchanged since 2018 with medical management recom   Cellulitis and abscess rt groin    Complication of anesthesia    " I woke up during a colonoscopy "      Depression    Diabetes mellitus    Diastolic CHF ([place])    Disorders of iron metabolism    Dysrhythmia    Fibromyalgia    GERD (gastroesophageal reflux disease)    History of hiatal hernia    Hyperlipidemia    Hypertension    Low serum testosterone level    Medically noncompliant    Myocardial infarction ([place])    05-23-20   Pneumonia    Past Surgical History:  Procedure Laterality Date   ABDOMINAL AORTOGRAM W/LOWER EXTREMITY N/A 03/21/2019   Procedure: ABDOMINAL AORTOGRAM W/LOWER EXTREMITY;  Surgeon: [name], MD;  Location: [place] [facility];  Service: Cardiovascular;  Laterality: N/A;   ABDOMINAL AORTOGRAM W/LOWER EXTREMITY N/A 11/12/2020   Procedure: ABDOMINAL AORTOGRAM W/LOWER EXTREMITY;  Surgeon: [name], MD;  Location: [place] [facility];  Service: Cardiovascular;  Laterality: N/A;   ABDOMINAL AORTOGRAM W/LOWER EXTREMITY N/A 01/28/2021   Procedure: ABDOMINAL AORTOGRAM W/LOWER EXTREMITY;  Surgeon: [name], MD;  Location: [place] [facility];  Service: Cardiovascular;  Laterality: N/A;   ABDOMINAL AORTOGRAM W/LOWER EXTREMITY Bilateral 07/29/2021   Procedure: ABDOMINAL AORTOGRAM W/LOWER EXTREMITY;  Surgeon: [name], MD;  Location: [facility]  INVASIVE [facility];  Service: Cardiovascular;  Laterality: Bilateral;   ANGIOPLASTY N/A 08/15/2021   Procedure: ATTEMPTED RIGHT PERONEAL  ANGIOPLASTY, LEFT PERONEAL ANGIOPLASTY;  Surgeon: [name], MD;  Location: [place];  Service: Vascular;  Laterality: N/A;   BACK SURGERY  2015   ACDF by Dr. Orland Jarred STUDY  12/23/2021   Procedure: BUBBLE STUDY;  Surgeon: Elouise Munroe, MD;  Location: [facility] ENDOSCOPY;  Service: Cardiology;;   COLONOSCOPY N/A 10/01/2014   Dr.  [name]: multiple tubular adenomas removed, colonic diverticulosis, redundant colon. next tcs advised for 09/2017. PATIENT NEEDS PROPOFOL FOR FAILED CONSCIOUS SEDATION   CORONARY STENT INTERVENTION N/A 07/30/2016   Procedure: Coronary Stent Intervention;  Surgeon: [name], MD;  Location: [place] [facility];  Service: Cardiovascular;  Laterality: N/A;   CORONARY STENT PLACEMENT  2000   By Dr. Olevia Perches   EP IMPLANTABLE DEVICE N/A 05/25/2016   Procedure: Loop Recorder Insertion;  Surgeon: [name], MD;  Location: [place] [facility];  Service: Cardiovascular;  Laterality: N/A;   ESOPHAGOGASTRODUODENOSCOPY     esophagus stretched remotely at [facility]   ESOPHAGOGASTRODUODENOSCOPY N/A 10/01/2014   Dr. [name]: patchy mottling/erythema and minimal polypoid appearance of gastric mucosa. bx with mild inlammation but no H.pylori   FEMORAL-POPLITEAL BYPASS GRAFT Left 11/22/2020   Procedure: LEFT FEMORAL-POPLITEAL BYPASS GRAFT;  Surgeon: [name], MD;  [facility];  Service: Vascular;  Laterality: Left;   FEMORAL-POPLITEAL BYPASS GRAFT Left 08/15/2021   Procedure: REDO LEFT FEMORAL-POPLITEAL BYPASS USING PROPATEN GRAFT;  Surgeon: [name], MD;  Location: [place];  Service: Vascular;  Laterality: Left;   HERNIA REPAIR  7078   umbilical   INSERTION OF ILIAC STENT Right 11/22/2020   Procedure: INSERTION OF ELUVIA STENT INTO RIGHT DISTAL SUPERFICIAL FEMORAL ARTERY;  Surgeon: [name], MD;  Location: [place];  Service: Vascular;  Laterality: Right;   LEFT HEART CATH AND CORONARY ANGIOGRAPHY N/A 07/30/2016   Procedure: Left Heart Cath and Coronary Angiography;  Surgeon: [name], MD;  Location: [place] [facility];  Service: Cardiovascular;  Laterality: N/A;   LEFT HEART CATH AND CORONARY ANGIOGRAPHY N/A 01/19/2018   Procedure: LEFT HEART CATH AND CORONARY ANGIOGRAPHY;  Surgeon: [name], MD;  Location: [place] [facility];  Service: Cardiovascular;  Laterality: N/A;   LEFT HEART CATH  AND CORONARY ANGIOGRAPHY N/A 05/24/2020   Procedure: LEFT HEART CATH AND CORONARY ANGIOGRAPHY;  Surgeon: Burnell Blanks, MD;  Location: [place] [facility];  Service: Cardiovascular;  Laterality: N/A;   LEFT HEART CATH AND CORONARY ANGIOGRAPHY N/A 08/06/2021   Procedure: LEFT HEART CATH AND CORONARY ANGIOGRAPHY;  Surgeon: Burnell Blanks, MD;  Location: [place] [facility];  Service: Cardiovascular;  Laterality: N/A;   LESION REMOVAL     Lip and hand    LOWER EXTREMITY ANGIOGRAM Right 11/22/2020   Procedure: RIGHT LEG ANGIOGRAM;  Surgeon: [name], MD;  [facility];  Service: Vascular;  Laterality: Right;   LOWER EXTREMITY ANGIOGRAM Right 08/15/2021   Procedure: RIGHT LOWER EXTREMITY ANGIOGRAM;  Surgeon: [name], MD;  [facility];  Service: Vascular;  Laterality: Right;   LOWER EXTREMITY ANGIOGRAPHY N/A 04/18/2019   Procedure: LOWER EXTREMITY ANGIOGRAPHY;  Surgeon: [name], MD;  Location: [place] [facility];  Service: Cardiovascular;  Laterality: N/A;   LOWER EXTREMITY ANGIOGRAPHY N/A 08/18/2021   Procedure: Lower Extremity Angiography;  Surgeon: Waynetta Sandy, MD;  Location: [place] [facility];  Service: Cardiovascular;  Laterality: N/A;   NECK SURGERY     PERIPHERAL VASCULAR BALLOON ANGIOPLASTY  04/18/2019   Procedure: PERIPHERAL VASCULAR BALLOON ANGIOPLASTY;  Surgeon: [name], MD;  Location: [place] [facility];  Service: Cardiovascular;;   PERIPHERAL VASCULAR BALLOON ANGIOPLASTY Left 11/12/2020   Procedure: PERIPHERAL VASCULAR BALLOON ANGIOPLASTY;  Surgeon: [name], MD;  Location: [place] [facility];  Service: Cardiovascular;  Laterality: Left;  Failed PTA of superficial femoral artery.   PERIPHERAL VASCULAR BALLOON ANGIOPLASTY Right 08/18/2021   Procedure: PERIPHERAL VASCULAR BALLOON ANGIOPLASTY;  Surgeon: Waynetta Sandy, MD;  Location: [place] [facility];  Service: Cardiovascular;  Laterality: Right;  peroneal    PERIPHERAL VASCULAR INTERVENTION Right 03/21/2019   Procedure: PERIPHERAL VASCULAR INTERVENTION;  Surgeon: [name], MD;  Location: [place] [facility];  Service: Cardiovascular;  Laterality: Right;  superficial femoral   PERIPHERAL VASCULAR INTERVENTION Right 08/18/2021   Procedure: PERIPHERAL VASCULAR INTERVENTION;  Surgeon: Waynetta Sandy, MD;  Location: [place] [facility];  Service: Cardiovascular;  Laterality: Right;  tibial peroneal trunk   TEE WITHOUT CARDIOVERSION N/A 12/23/2021   Procedure: TRANSESOPHAGEAL ECHOCARDIOGRAM (TEE);  Surgeon: Elouise Munroe, MD;  Location: [place];  Service: Cardiology;  Laterality: N/A;   Social History:  reports that he quit smoking about 5 years ago. His smoking use included cigarettes. He has a 12.75 pack-year smoking history. He has never used smokeless tobacco. He reports that he does not drink alcohol and does not use drugs.  Allergies  Allergen Reactions   Shellfish Allergy Anaphylaxis and Other (See Comments)    Tongue swelling, hives    Sulfa Antibiotics Anaphylaxis and Rash    Tongue swelling, hives   Ace Inhibitors Other (See Comments) and Cough    CKD, renal failure    Escitalopram Other (See Comments)    Buzzing in ears,headache, felt like a zombie    Evolocumab Other (See Comments)    Myalgias, flu like sx    Fenofibrate Other (See Comments)    Body aches - pt currently taking isnt sure if its causing any pain    Invokana [Canagliflozin] Other (See Comments)    Syncope / dehydration   Lisinopril Cough   Metformin And Related Itching   Pravastatin Sodium Other (See Comments)    myalgias   Crestor [Rosuvastatin] Other (See Comments)    Myalgias    Horse-Derived Products Rash    horse serum   Lexapro [Escitalopram Oxalate] Other (See Comments)    Buzzing in ears,headache, felt like a zombie   Lipitor [Atorvastatin] Other (See Comments)    myalgias   Livalo [Pitavastatin] Other (See Comments)     Myalgias    Milk (Cow) Nausea Only    Ties stomach in knots     Tape Rash    Family History  Problem Relation Age of Onset   Diabetes Father    Valvular heart disease Father    Arthritis Father    Heart disease Father    Stroke Father    Alzheimer's disease Mother    Hyperlipidemia Mother    Hypertension Mother    Arthritis Mother    Lung cancer Mother    Stroke Mother    Headache Mother    Arthritis/Rheumatoid Sister    Diabetes Sister    Hypertension Sister    Hyperlipidemia Sister    Depression Sister    Dementia Maternal Aunt    Dementia Maternal Uncle    Heart disease  Maternal Uncle    Stomach cancer Paternal Uncle    Colon cancer Neg Hx    Liver disease Neg Hx     Prior to Admission medications   Medication Sig Start Date End Date Taking? Authorizing Provider  acetaminophen (TYLENOL) 325 MG tablet Take 2 tablets (650 mg total) by mouth every 6 (six) hours as needed for mild pain (or Fever >/= 101). 12/23/21   Cherene Altes, MD  albuterol (VENTOLIN HFA) 108 (90 Base) MCG/ACT inhaler Inhale 2 puffs into the lungs every 6 (six) hours as needed for wheezing or shortness of breath. 11/04/21   Ronnie Doss M, DO  busPIRone (BUSPAR) 7.5 MG tablet Take 1 tablet (7.5 mg total) by mouth 3 (three) times daily as needed. 01/09/22   [name] A, FNP  Chlorphen-Phenyleph-ASA (ALKA-SELTZER PLUS COLD PO) Take 2 tablets by mouth daily.    [provider]  clopidogrel (PLAVIX) 75 MG tablet TAKE 1 TABLET BY MOUTH DAILY 11/06/21   Angelia Mould, MD  dapagliflozin propanediol (FARXIGA) 5 MG TABS tablet Take 1 tablet (5 mg total) by mouth daily before breakfast. Patient taking differently: Take 10 mg by mouth daily before breakfast. 04/29/21   [name], FNP  doxycycline (VIBRAMYCIN) 100 MG capsule Take 1 capsule (100 mg total) by mouth 2 (two) times daily. 01/20/22   Orpah Greek, MD  DULoxetine (CYMBALTA) 30 MG capsule Take 3 capsules (90 mg  total) by mouth daily. 01/09/22   [name], FNP  ELIQUIS 5 MG TABS tablet TAKE ONE (1) TABLET BY MOUTH TWICE DAILY 01/01/22   [name] A, FNP  ezetimibe (ZETIA) 10 MG tablet Take 1 tablet (10 mg total) by mouth daily. 04/28/21   Josue Hector, MD  fenofibrate 160 MG tablet Take 1 tablet (160 mg total) by mouth daily. 04/29/21   [name] A, FNP  furosemide (LASIX) 20 MG tablet Take 20 mg by mouth daily.    [provider]  gabapentin (NEURONTIN) 300 MG capsule TAKE 1 CAPSULE BY MOUTH 3 TIMES DAILY 12/03/21   [name] A, FNP  HYDROcodone-acetaminophen (NORCO) 10-325 MG tablet Take 1 tablet by mouth every 4 (four) hours as needed for moderate pain. 08/20/21   Dagoberto Ligas, PA-C  insulin lispro (HUMALOG) 100 UNIT/ML KwikPen INJECT 15-20 UNITS SUBCUTANEOUSLY THREE TIMES A DAY WITH MEALS Patient taking differently: 15-20 Units 3 (three) times daily. Sliding scale 12/03/21   [name] A, FNP  insulin NPH-regular Human (HUMULIN 70/30) (70-30) 100 UNIT/ML injection Inject 60 Units into the skin 2 (two) times daily with a meal. 11/10/21   [name] A, FNP  Insulin Syringe-Needle U-100 28G X 1/2" 1 ML MISC Use to give insulin five times daily Dx E11.42 05/07/21   [name] A, FNP  isosorbide mononitrate (IMDUR) 30 MG 24 hr tablet TAKE 1 TABLET BY MOUTH DAILY 12/03/21   [name], FNP  Menthol, Topical Analgesic, (BLUE-EMU MAXIMUM STRENGTH EX) Apply 1 application. topically daily as needed (pain).    [provider]  metoprolol succinate (TOPROL-XL) 50 MG 24 hr tablet Take 1 tablet by mouth once daily 08/28/21   Josue Hector, MD  METOPROLOL TARTRATE PO Take 50 mg by mouth as needed (afib).    [provider]  nitroGLYCERIN (NITROSTAT) 0.4 MG SL tablet DISSOLVE ONE TABLET UNDER THE TONGUE EVERY 5 MINUTES AS NEEDED FOR CHEST PAIN.  DO NOT EXCEED A TOTAL OF 3 DOSES IN 15 MINUTES Strength: 0.4 mg  04/28/21   Josue Hector, MD  pantoprazole  (PROTONIX) 40 MG tablet TAKE 1 TABLET BY MOUTH DAILY 12/03/21   [name] A, FNP  tirzepatide [facility]) 7.5 MG/0.5ML Pen Inject 7.5 mg into the skin once a week. 11/14/21   [name], FNP  vitamin B-12 (CYANOCOBALAMIN) 1000 MCG tablet Take 1,000 mcg by mouth daily.    [provider]    Physical Exam: Vitals:   01/21/22 1831 01/21/22 1840 01/21/22 2116 01/21/22 2245  BP: 128/85  (!) 143/78 (!) 144/94  Pulse: (!) 102  99 94  Resp: 18  (!) 24 (!) 22  Temp: 99.2 F (37.3 C)     TempSrc: Oral     SpO2: 95%  93% 95%  Weight:  122 kg    Height:  $Remove'6\' 2"'ILDPzAn$  (1.88 m)     Constitutional: Morbidly obese, acutely ill looking NAD, calm, comfortable Eyes: PERRL, lids and conjunctivae normal ENMT: Mucous membranes are moist. Posterior pharynx clear of any exudate or lesions.Normal dentition.  Neck: normal, supple, no masses, no thyromegaly Respiratory: clear to auscultation bilaterally, no wheezing, no crackles. Normal respiratory effort. No accessory muscle use.  Cardiovascular: Tachycardic, irregularly irregular rate and rhythm, no murmurs / rubs / gallops. No extremity edema. 2+ pedal pulses. No carotid bruits.  Abdomen: Distended, mild tenderness in the right upper quadrant no masses palpated. No hepatosplenomegaly. Bowel sounds positive.  Musculoskeletal: Good range of motion, no joint swelling or tenderness, Skin: Left foot dry gangrene involving the third fourth and fifth toes Neurologic: CN 2-12 grossly intact. Sensation intact, DTR normal. Strength 5/5 in all 4.  Psychiatric: Normal judgment and insight. Alert and oriented x 3. Normal mood   Data Reviewed:  Temperature is 99.4, heart rate is 120, white count 20.9, sodium 132, potassium 4.0, chloride 95, CO2 24, glucose 184, BUN 28 creatinine 1.91 and calcium 9.5.  AST 243 ALT 139 total protein 8.7 albumin is 1.6.  Lipase is 21.  Initial lactic acid was 3.0, second lactic acid 3.1.  Hemoglobin 11.5 and platelets 348.   Influenza and COVID screen is negative.  PT 22.2 INR 2.0.  CT abdomen pelvis showed layering hyperdensity in the gallbladder which may be stones or sludge.  Also question of mild pericholecystic fat.  Hepatomegaly and hepatic steatosis and some mild ascites.  Trace pleural effusion.  Right lower quadrant abdominal ultrasound showed cholelithiasis mild gallbladder wall thickening no evidence of acute cholecystitis.  Assessment and Plan:   #1 sepsis: Not sure of exact cause but suspected GI source especially infected gallbladder versus his dry gangrene.  Patient will be initiated on the sepsis protocol.  Noting that he has diastolic CHF we will be careful with the fluids.  Initiate IV antibiotics.  May need to have either MRCP or HIDA scan to evaluate the gallbladder.  There is cholelithiasis but no obvious evidence of acute cholecystitis.  If confirmed gallbladder source may require surgical consultation when stable.  #2 A-fib with RVR: Patient has chronic A-fib on anticoagulation.  Has not received his afternoon medication on the evening medications.  His rate is high.  We will resume home regimen with as needed medications for rate control.  Continue Eliquis.  If surgery is contemplated we will hold for at least 48 to 72 hours.  #3 insulin-dependent diabetes: Continue with sliding scale insulin.  Also on long-acting insulin which we will resume.  #4 peripheral vascular disease: Patient has dry gangrene of the left foot.  This is being followed  in the outpatient setting.  Continue  #5 AKI: Probably prerenal.  Monitor closely.  #6 hyperlipidemia: Confirm and resume home regimen.  #7 coronary artery disease: No acute decompensation.  Continue to monitor    Advance Care Planning:   Code Status: Prior full code  Consults: None  Family Communication: Wife at bedside  Severity of Illness: The appropriate patient status for this patient is INPATIENT. Inpatient status is judged to be reasonable  and necessary in order to provide the required intensity of service to ensure the patient's safety. The patient's presenting symptoms, physical exam findings, and initial radiographic and laboratory data in the context of their chronic comorbidities is felt to place them at high risk for further clinical deterioration. Furthermore, it is not anticipated that the patient will be medically stable for discharge from the hospital within 2 midnights of admission.   * I certify that at the point of admission it is my clinical judgment that the patient will require inpatient hospital care spanning beyond 2 midnights from the point of admission due to high intensity of service, high risk for further deterioration and high frequency of surveillance required.*  AuthorBarbette Merino, MD 01/21/2022 11:04 PM  For on call review [email or web address].

## 2022-01-21 NOTE — ED Notes (Signed)
Pt stated he was having trouble breathing also, he needed oxygen earlier in the wee at another ED. This NT was able to put the Pt on 2L on O2 [place].

## 2022-01-21 NOTE — Telephone Encounter (Signed)
Must go to hospital!!!

## 2022-01-21 NOTE — ED Provider Notes (Addendum)
[facility] Provider Note   CSN: [id number] Arrival date & time: 01/21/22  1828     History  Chief Complaint  Patient presents with   Weakness    [name] [name] is a 71 y.o. male.  Patient with fevers for past 4-5 days, up to tmax 102 two days ago. Patient with general weakness, no energy, fatigued. Intermittent has c/o upper abd pain. +nausea. Is having normal bms, no diarrhea. No abd distension. Denies hx gallstones. No dysuria or gu c/o. Has chronic dry gangrene to tips of toes 2-4 on left, and plantar aspect of base great toe - family indicates those areas appear mildly improved. Also w intermittent scabs/sores to anterior left lower leg - family noted small amount of pus from one of those areas earlier in week. Was seen at [facility] - given abx for possible cellulitis. Pt with hx recurrent group b strep bacteria 2x earlier in year. Pt is poor historian - level 5 caveat.   The history is provided by the patient, the spouse, medical records and a relative. The history is limited by the condition of the patient.  Weakness Associated symptoms: abdominal pain and nausea   Associated symptoms: no chest pain, no cough, no dysuria, no fever, no headaches and no shortness of breath        Home Medications Prior to Admission medications   Medication Sig Start Date End Date Taking? Authorizing Provider  acetaminophen (TYLENOL) 325 MG tablet Take 2 tablets (650 mg total) by mouth every 6 (six) hours as needed for mild pain (or Fever >/= 101). 12/23/21   Cherene Altes, MD  albuterol (VENTOLIN HFA) 108 (90 Base) MCG/ACT inhaler Inhale 2 puffs into the lungs every 6 (six) hours as needed for wheezing or shortness of breath. 11/04/21   Ronnie Doss M, DO  busPIRone (BUSPAR) 7.5 MG tablet Take 1 tablet (7.5 mg total) by mouth 3 (three) times daily as needed. 01/09/22   [name] A, FNP  Chlorphen-Phenyleph-ASA (ALKA-SELTZER PLUS COLD PO) Take 2 tablets  by mouth daily.    [provider]  clopidogrel (PLAVIX) 75 MG tablet TAKE 1 TABLET BY MOUTH DAILY 11/06/21   Angelia Mould, MD  dapagliflozin propanediol (FARXIGA) 5 MG TABS tablet Take 1 tablet (5 mg total) by mouth daily before breakfast. Patient taking differently: Take 10 mg by mouth daily before breakfast. 04/29/21   [name], FNP  doxycycline (VIBRAMYCIN) 100 MG capsule Take 1 capsule (100 mg total) by mouth 2 (two) times daily. 01/20/22   Orpah Greek, MD  DULoxetine (CYMBALTA) 30 MG capsule Take 3 capsules (90 mg total) by mouth daily. 01/09/22   [name], FNP  ELIQUIS 5 MG TABS tablet TAKE ONE (1) TABLET BY MOUTH TWICE DAILY 01/01/22   [name] A, FNP  ezetimibe (ZETIA) 10 MG tablet Take 1 tablet (10 mg total) by mouth daily. 04/28/21   Josue Hector, MD  fenofibrate 160 MG tablet Take 1 tablet (160 mg total) by mouth daily. 04/29/21   [name] A, FNP  furosemide (LASIX) 20 MG tablet Take 20 mg by mouth daily.    [provider]  gabapentin (NEURONTIN) 300 MG capsule TAKE 1 CAPSULE BY MOUTH 3 TIMES DAILY 12/03/21   [name] A, FNP  HYDROcodone-acetaminophen (NORCO) 10-325 MG tablet Take 1 tablet by mouth every 4 (four) hours as needed for moderate pain. 08/20/21   Dagoberto Ligas, PA-C  insulin lispro (HUMALOG)  100 UNIT/ML KwikPen INJECT 15-20 UNITS SUBCUTANEOUSLY THREE TIMES A DAY WITH MEALS Patient taking differently: 15-20 Units 3 (three) times daily. Sliding scale 12/03/21   [name] A, FNP  insulin NPH-regular Human (HUMULIN 70/30) (70-30) 100 UNIT/ML injection Inject 60 Units into the skin 2 (two) times daily with a meal. 11/10/21   [name] A, FNP  Insulin Syringe-Needle U-100 28G X 1/2" 1 ML MISC Use to give insulin five times daily Dx E11.42 05/07/21   [name] A, FNP  isosorbide mononitrate (IMDUR) 30 MG 24 hr tablet TAKE 1 TABLET BY MOUTH DAILY 12/03/21   [name], FNP  Menthol, Topical  Analgesic, (BLUE-EMU MAXIMUM STRENGTH EX) Apply 1 application. topically daily as needed (pain).    [provider]  metoprolol succinate (TOPROL-XL) 50 MG 24 hr tablet Take 1 tablet by mouth once daily 08/28/21   Josue Hector, MD  METOPROLOL TARTRATE PO Take 50 mg by mouth as needed (afib).    [provider]  nitroGLYCERIN (NITROSTAT) 0.4 MG SL tablet DISSOLVE ONE TABLET UNDER THE TONGUE EVERY 5 MINUTES AS NEEDED FOR CHEST PAIN.  DO NOT EXCEED A TOTAL OF 3 DOSES IN 15 MINUTES Strength: 0.4 mg 04/28/21   Josue Hector, MD  pantoprazole (PROTONIX) 40 MG tablet TAKE 1 TABLET BY MOUTH DAILY 12/03/21   Hawks, Alyse Low A, FNP  tirzepatide [facility] 7.5 MG/0.5ML Pen Inject 7.5 mg into the skin once a week. 11/14/21   [name], FNP  vitamin B-12 (CYANOCOBALAMIN) 1000 MCG tablet Take 1,000 mcg by mouth daily.    [provider]      Allergies    Shellfish allergy, Sulfa antibiotics, Ace inhibitors, Escitalopram, Evolocumab, Fenofibrate, Invokana [canagliflozin], Lisinopril, Metformin and related, Pravastatin sodium, Crestor [rosuvastatin], Horse-derived products, Lexapro [escitalopram oxalate], Lipitor [atorvastatin], Livalo [pitavastatin], Milk (cow), and Tape    Review of Systems   Review of Systems  Constitutional:  Negative for fever.  HENT:  Negative for sore throat.   Eyes:  Negative for redness.  Respiratory:  Negative for cough and shortness of breath.   Cardiovascular:  Negative for chest pain.  Gastrointestinal:  Positive for abdominal pain and nausea.  Genitourinary:  Negative for dysuria and flank pain.  Musculoskeletal:  Negative for back pain, neck pain and neck stiffness.  Skin:  Negative for rash.  Neurological:  Positive for weakness. Negative for headaches.  Hematological:  Does not bruise/bleed easily.    Physical Exam Updated Vital Signs BP 128/85 (BP Location: Left Arm)   Pulse (!) 102   Temp 99.2 F (37.3 C) (Oral)   Resp 18   Ht  1.88 m ('6\' 2"'$ )   Wt 122 kg   SpO2 95%   BMI 34.53 kg/m  Physical Exam Vitals and nursing note reviewed.  Constitutional:      Appearance: Normal appearance. He is well-developed.  HENT:     Head: Atraumatic.     Nose: Nose normal.     Mouth/Throat:     Mouth: Mucous membranes are moist.     Pharynx: Oropharynx is clear.     Comments: Poor dental hygiene, multiple chronic caries, no def acute abscess.  Eyes:     General: No scleral icterus.    Conjunctiva/sclera: Conjunctivae normal.     Pupils: Pupils are equal, round, and reactive to light.  Neck:     Vascular: No carotid bruit.     Trachea: No tracheal deviation.     Comments: No neck stiffness or  rigidity.  Cardiovascular:     Rate and Rhythm: Regular rhythm. Tachycardia present.     Pulses: Normal pulses.     Heart sounds: Normal heart sounds. No murmur heard.    No friction rub. No gallop.  Pulmonary:     Effort: Pulmonary effort is normal. No accessory muscle usage or respiratory distress.     Breath sounds: Normal breath sounds.  Abdominal:     General: Bowel sounds are normal. There is no distension.     Palpations: Abdomen is soft. There is no mass.     Tenderness: There is abdominal tenderness. There is no guarding or rebound.     Hernia: No hernia is present.     Comments: Ruq tenderness.   Genitourinary:    Comments: No cva tenderness. Musculoskeletal:        General: No swelling.     Cervical back: Normal range of motion and neck supple. No rigidity.     Comments: Dry gangrene to plantar aspect base left 1st toe and tips of left 2-4 toes. Small, ~ 1 cm scabs to anterior aspect left lower leg - no obvious infection of area.  No focal joint pain, redness or swelling. CTLS spine, non tender, aligned, no step off.   Skin:    General: Skin is warm and dry.     Findings: No rash.  Neurological:     Mental Status: He is alert.     Comments: Alert, speech clear. Motor/sens grossly intact bil.   Psychiatric:         Mood and Affect: Mood normal.     ED Results / Procedures / Treatments   Labs (all labs ordered are listed, but only abnormal results are displayed) Results for orders placed or performed during the hospital encounter of 01/21/22  Lactic acid, plasma  Result Value Ref Range   Lactic Acid, Venous 3.0 (HH) 0.5 - 1.9 mmol/L  Comprehensive metabolic panel  Result Value Ref Range   Sodium 132 (L) 135 - 145 mmol/L   Potassium 4.1 3.5 - 5.1 mmol/L   Chloride 95 (L) 98 - 111 mmol/L   CO2 24 22 - 32 mmol/L   Glucose, Bld 184 (H) 70 - 99 mg/dL   BUN 28 (H) 8 - 23 mg/dL   Creatinine, Ser 1.91 (H) 0.61 - 1.24 mg/dL   Calcium 9.5 8.9 - 10.3 mg/dL   Total Protein 8.7 (H) 6.5 - 8.1 g/dL   Albumin 4.1 3.5 - 5.0 g/dL   AST 243 (H) 15 - 41 U/L   ALT 139 (H) 0 - 44 U/L   Alkaline Phosphatase 50 38 - 126 U/L   Total Bilirubin 1.6 (H) 0.3 - 1.2 mg/dL   GFR, Estimated 37 (L) >60 mL/min   Anion gap 13 5 - 15  CBC with Differential  Result Value Ref Range   WBC 20.9 (H) 4.0 - 10.5 K/uL   RBC 4.92 4.22 - 5.81 MIL/uL   Hemoglobin 11.5 (L) 13.0 - 17.0 g/dL   HCT 38.2 (L) 39.0 - 52.0 %   MCV 77.6 (L) 80.0 - 100.0 fL   MCH 23.4 (L) 26.0 - 34.0 pg   MCHC 30.1 30.0 - 36.0 g/dL   RDW 16.3 (H) 11.5 - 15.5 %   Platelets 348 150 - 400 K/uL   nRBC 0.0 0.0 - 0.2 %   Neutrophils Relative % 87 %   Neutro Abs 18.1 (H) 1.7 - 7.7 K/uL   Lymphocytes Relative 5 %  Lymphs Abs 1.0 0.7 - 4.0 K/uL   Monocytes Relative 7 %   Monocytes Absolute 1.5 (H) 0.1 - 1.0 K/uL   Eosinophils Relative 0 %   Eosinophils Absolute 0.0 0.0 - 0.5 K/uL   Basophils Relative 0 %   Basophils Absolute 0.1 0.0 - 0.1 K/uL   Immature Granulocytes 1 %   Abs Immature Granulocytes 0.14 (H) 0.00 - 0.07 K/uL  Protime-INR  Result Value Ref Range   Prothrombin Time 22.3 (H) 11.4 - 15.2 seconds   INR 2.0 (H) 0.8 - 1.2  APTT  Result Value Ref Range   aPTT 50 (H) 24 - 36 seconds  Lipase, blood  Result Value Ref Range   Lipase 21 11  - 51 U/L   *Note: Due to a large number of results and/or encounters for the requested time period, some results have not been displayed. A complete set of results can be found in Results Review.   DG Chest 2 View  Result Date: 01/21/2022 CLINICAL DATA:  Crackles in chest EXAM: CHEST - 2 VIEW COMPARISON:  01/20/2022, 12/17/2021 FINDINGS: Electronic device over left chest. Cardiomegaly with mild central congestion. No focal airspace disease, pleural effusion, or pneumothorax. Aortic atherosclerosis. IMPRESSION: Cardiomegaly with mild central congestion Electronically Signed   By: Donavan Foil M.D.   On: 01/21/2022 20:38   DG Chest Port 1 View  Result Date: 01/20/2022 CLINICAL DATA:  Questionable sepsis - evaluate for abnormality EXAM: PORTABLE CHEST 1 VIEW COMPARISON:  CT chest 12/17/2021, chest x-[place] 12/17/2021 FINDINGS: The heart and mediastinal contours are unchanged. Aortic calcification. Improved aeration of the left base. No focal consolidation. No pulmonary edema. No pleural effusion. No pneumothorax. No acute osseous abnormality. IMPRESSION: No active disease. Electronically Signed   By: Iven Finn M.D.   On: 01/20/2022 01:11   ECHO TEE  Result Date: 12/23/2021    TRANSESOPHOGEAL ECHO REPORT   Patient Name:   [name] Date of Exam: 12/23/2021 Medical Rec #:  [id number]    Height:       74.0 in Accession #:    [id number]   Weight:       280.0 lb Date of Birth:  [birth year]    BSA:          2.508 m Patient Age:    42 years     BP:           108/74 mmHg Patient Gender: M            HR:           72 bpm. Exam Location:  Inpatient Procedure: Transesophageal Echo, 3D Echo, Color Doppler and Cardiac Doppler Indications:     Bacteremia  History:         Patient has prior history of Echocardiogram examinations, most                  recent 12/19/2021. CAD, Arrythmias:Atrial Fibrillation,                  Signs/Symptoms:Bacteremia and Murmur; Risk Factors:Hypertension                  and Dyslipidemia.   Sonographer:     Johny Chess RDCS Referring Phys:  [place] Diagnosing Phys: Cherlynn Kaiser MD PROCEDURE: After discussion of the risks and benefits of a TEE, an informed consent was obtained from the patient. TEE procedure time was 33 minutes. The transesophogeal probe was passed without difficulty through the esophogus  of the patient. Imaged were obtained with the patient in a left lateral decubitus position. Local oropharyngeal anesthetic was provided with Cetacaine. Sedation performed by different physician. The patient was monitored while under deep sedation. Anesthestetic sedation was provided intravenously by Anesthesiology: '453mg'$  of Propofol, '100mg'$  of Lidocaine. Image quality was good. The patient's vital signs; including heart rate, blood pressure, and oxygen saturation; remained stable throughout the procedure. The patient developed no complications during the procedure. IMPRESSIONS  1. Left ventricular ejection fraction, by estimation, is 45%. The left ventricle has mildly decreased function.  2. Right ventricular systolic function is mildly reduced. The right ventricular size is normal.  3. Left atrial size was mildly dilated. No left atrial/left atrial appendage thrombus was detected. The LAA emptying velocity was 26 cm/s.  4. Right atrial size was mildly dilated.  5. The mitral valve is grossly normal. Moderate-severe mitral valve regurgitation appears functional and commissural, from lack of complete coaption of the leaflets. Systolic reversal seen in the RUPV, otherwise systolic blunting noted. No evidence of mitral stenosis.  6. Tricuspid valve regurgitation is moderate.  7. The aortic valve is tricuspid. There is mild calcification of the aortic valve. Aortic valve regurgitation is moderate. Vena contracta 0.5 cm.  8. There is Severe (Grade IV) atheroma plaque involving the aortic arch and descending aorta.  9. Agitated saline contrast bubble study was negative, with no evidence of  any interatrial shunt. Conclusion(s)/Recommendation(s): No evidence of vegetation/infective endocarditis on this transesophageael echocardiogram. FINDINGS  Left Ventricle: Left ventricular ejection fraction, by estimation, is 45%. The left ventricle has mildly decreased function. The left ventricular internal cavity size was normal in size. Right Ventricle: The right ventricular size is normal. No increase in right ventricular wall thickness. Right ventricular systolic function is mildly reduced. Left Atrium: Left atrial size was mildly dilated. No left atrial/left atrial appendage thrombus was detected. The LAA emptying velocity was 26 cm/s. Right Atrium: Right atrial size was mildly dilated. Pericardium: There is no evidence of pericardial effusion. Mitral Valve: The mitral valve is grossly normal. Moderate to severe mitral valve regurgitation. No evidence of mitral valve stenosis. Tricuspid Valve: The tricuspid valve is grossly normal. Tricuspid valve regurgitation is moderate. Aortic Valve: The aortic valve is tricuspid. There is mild calcification of the aortic valve. Aortic valve regurgitation is moderate. Aortic regurgitation PHT measures 786 msec. No aortic stenosis is present. Pulmonic Valve: The pulmonic valve was not well visualized. Pulmonic valve regurgitation is trivial. Aorta: The aortic root and ascending aorta are structurally normal, with no evidence of dilitation. There is severe (Grade IV) atheroma plaque involving the aortic arch and descending aorta. IAS/Shunts: The interatrial septum appears to be lipomatous. No atrial level shunt detected by color flow Doppler. Agitated saline contrast was given intravenously to evaluate for intracardiac shunting. Agitated saline contrast bubble study was negative,  with no evidence of any interatrial shunt.  LEFT VENTRICLE PLAX 2D LVOT diam:     2.10 cm LV SV:         42 LV SV Index:   17 LVOT Area:     3.46 cm  AORTIC VALVE LVOT Vmax:         66.70 cm/s  LVOT Vmean:        44.000 cm/s LVOT VTI:          0.121 m AI PHT:            786 msec AR Vena Contracta: 0.50 cm  AORTA Ao Asc diam: 3.10 cm MR  Peak grad: 69.9 mmHg MR Mean grad: 44.0 mmHg   SHUNTS MR Vmax:      418.00 cm/s Systemic VTI:  0.12 m MR Vmean:     311.0 cm/s  Systemic Diam: 2.10 cm Cherlynn Kaiser MD Electronically signed by Cherlynn Kaiser MD Signature Date/Time: 12/23/2021/4:41:41 PM    Final       EKG Afib, rbbb. Non spec st/t changes  Radiology CT ABDOMEN PELVIS W CONTRAST  Result Date: 01/21/2022 CLINICAL DATA:  Acute abdominal pain. EXAM: CT ABDOMEN AND PELVIS WITH CONTRAST TECHNIQUE: Multidetector CT imaging of the abdomen and pelvis was performed using the standard protocol following bolus administration of intravenous contrast. RADIATION DOSE REDUCTION: This exam was performed according to the departmental dose-optimization program which includes automated exposure control, adjustment of the mA and/or kV according to patient size and/or use of iterative reconstruction technique. CONTRAST:  73m OMNIPAQUE IOHEXOL 300 MG/ML  SOLN COMPARISON:  CT 12/17/2021 FINDINGS: Lower chest: Trace right pleural effusion is new from prior exam. Breathing motion artifact limits detailed assessment. Heart is upper normal in size. There are coronary artery calcifications. Hepatobiliary: The liver is enlarged spanning 22.9 cm cranial caudal with diffuse steatosis. No evidence of focal liver lesion. There is layering hyperdensity in the gallbladder which may represent stones/sludge. Question of mild pericholecystic fat stranding, not well assessed due to motion. No biliary dilatation. Small volume of perihepatic ascites. Pancreas: Fatty atrophy.  No ductal dilatation or inflammation. Spleen: Normal in size. No obvious focal abnormality, motion limited evaluation. Adrenals/Urinary Tract: Normal adrenal glands. No hydronephrosis. No renal calculi. Mild symmetric perinephric edema, also seen on prior no  suspicious renal lesion. Unremarkable urinary bladder. Stomach/Bowel: Motion artifact through the upper abdomen limits bowel assessment. No inflammation of the stomach or duodenum. There is no small bowel obstruction or small bowel inflammation. Normal appendix visualized. Small volume of colonic stool. Sigmoid colon is redundant. Vascular/Lymphatic: Aortic atherosclerosis. Calcified noncalcified atheromatous plaque. No aneurysm. Prominent periportal node measuring 12 mm, likely reactive. No suspicious adenopathy. Reproductive: Prostate is unremarkable. Other: Small amount of perihepatic ascites. Minimal free fluid in the right pericolic gutter which tracks into the pelvis. No free air. Fat within both inguinal canals, right greater than left. Musculoskeletal: Stable degenerative change in the spine. There are no acute or suspicious osseous abnormalities. IMPRESSION: 1. Layering hyperdensity in the gallbladder may represent stones/sludge. Question of mild pericholecystic fat stranding, not well assessed due to motion artifact. Right upper quadrant ultrasound is planned. 2. Hepatomegaly and hepatic steatosis. Small volume of perihepatic and right pericolic gutter ascites. 3. Trace right pleural effusion, new from prior exam. Aortic Atherosclerosis (ICD10-I70.0). Electronically Signed   By: MKeith RakeM.D.   On: 01/21/2022 22:27   DG Chest 2 View  Result Date: 01/21/2022 CLINICAL DATA:  Crackles in chest EXAM: CHEST - 2 VIEW COMPARISON:  01/20/2022, 12/17/2021 FINDINGS: Electronic device over left chest. Cardiomegaly with mild central congestion. No focal airspace disease, pleural effusion, or pneumothorax. Aortic atherosclerosis. IMPRESSION: Cardiomegaly with mild central congestion Electronically Signed   By: KDonavan FoilM.D.   On: 01/21/2022 20:38   DG Chest Port 1 View  Result Date: 01/20/2022 CLINICAL DATA:  Questionable sepsis - evaluate for abnormality EXAM: PORTABLE CHEST 1 VIEW COMPARISON:  CT  chest 12/17/2021, chest x-[place] 12/17/2021 FINDINGS: The heart and mediastinal contours are unchanged. Aortic calcification. Improved aeration of the left base. No focal consolidation. No pulmonary edema. No pleural effusion. No pneumothorax. No acute osseous abnormality. IMPRESSION: No active disease. Electronically  Signed   By: Iven Finn M.D.   On: 01/20/2022 01:11    Procedures Procedures    Medications Ordered in ED Medications  sodium chloride 0.9 % bolus 1,000 mL (has no administration in time range)  lactated ringers bolus 1,000 mL (has no administration in time range)  ceFEPIme (MAXIPIME) 2 g in sodium chloride 0.9 % 100 mL IVPB (has no administration in time range)  metroNIDAZOLE (FLAGYL) IVPB 500 mg (has no administration in time range)  vancomycin (VANCOCIN) IVPB 1000 mg/200 mL premix (has no administration in time range)    ED Course/ Medical Decision Making/ A&P                           Medical Decision Making Problems Addressed: Acute febrile illness: acute illness or injury with systemic symptoms that poses a threat to life or bodily functions AKI (acute kidney injury) ([place]): acute illness or injury with systemic symptoms that poses a threat to life or bodily functions Cholecystitis, acute: acute illness or injury with systemic symptoms that poses a threat to life or bodily functions Dry gangrene [facility]): chronic illness or injury Elevated lactic acid level: acute illness or injury with systemic symptoms that poses a threat to life or bodily functions Elevated liver function tests: acute illness or injury Generalized weakness: acute illness or injury with systemic symptoms Other elevated white blood cell (WBC) count: acute illness or injury  Amount and/or Complexity of Data Reviewed Independent Historian: spouse    Details: hx External Data Reviewed: notes. Labs: ordered. Decision-making details documented in ED Course. Radiology: ordered and independent  interpretation performed. Decision-making details documented in ED Course. ECG/medicine tests: ordered and independent interpretation performed. Decision-making details documented in ED Course. Discussion of management or test interpretation with external provider(s): medicine  Risk Prescription drug management. Parenteral controlled substances. Decision regarding hospitalization.   Iv ns. Continuous pulse ox and cardiac monitoring. Labs ordered/sent. Imaging ordered.   Reviewed nursing notes and prior charts for additional history. External reports reviewed. Additional history from: family.   Cardiac monitor: sinus rhythm, rate 102.  Labs reviewed/interpreted by me - lactate high, wbc high, aki. Ns bolus. Cultures sent.  Iv abx given.   Xrays reviewed/interpreted by me - no pna.   CT reviewed/interpreted by me - gb sludge/?stones.   [place] reviewed/interpreted by me - pnd  Additional fluid bolus, LR. Delta lactate pending.   Hsopitalists consulted for admission.  CRITICAL CARE RE: sepsis, elevated lactate, aki/dehydration, acute febrile illness, cholecystitis. Dry gangrene Performed by: Mirna Mires Total critical care time: 40 minutes Critical care time was exclusive of separately billable procedures and treating other patients. Critical care was necessary to treat or prevent imminent or life-threatening deterioration. Critical care was time spent personally by me on the following activities: development of treatment plan with patient and/or surrogate as well as nursing, discussions with consultants, evaluation of patient's response to treatment, examination of patient, obtaining history from patient or surrogate, ordering and performing treatments and interventions, ordering and review of laboratory studies, ordering and review of radiographic studies, pulse oximetry and re-evaluation of patient's condition.  Discussed pt with Dr Jonelle Sidle - will admit.  Additional ivf.            Final Clinical Impression(s) / ED Diagnoses Final diagnoses:  None    Rx / DC Orders ED Discharge Orders     None          Lajean Saver, MD  01/21/22 2258  

## 2022-01-21 NOTE — Telephone Encounter (Signed)
PATIENT AWARE

## 2022-01-21 NOTE — ED Provider Triage Note (Signed)
Emergency Medicine Provider Triage Evaluation Note  Porter SAVIEN MAMULA , a 71 y.o. male  was evaluated in triage.  Pt complains of "do not feel well".  Patient's wife is concerned that patient has been more lethargic, fatigued, and unwell.  He was seen at [facility] yesterday  There is a telephone note from a physician earlier today instructing them to come to the ER.  Review of Systems  Positive: Not feeling well Negative: Vomiting  Physical Exam  BP 128/85 (BP Location: Left Arm)   Pulse (!) 102   Temp 99.2 F (37.3 C) (Oral)   Resp 18   Ht '6\' 2"'$  (1.88 m)   Wt 122 kg   SpO2 95%   BMI 34.53 kg/m  Gen:   Awake, but somnolent. Resp:  Normal effort MSK:   Moves extremities without difficulty  Other:  Oriented to self.  Thinks that he is at [facility].  Oriented to year. Aox2  Somnolent and awakens to sternal rub.  Answers questions and then falls back asleep.  Protuberant abdomen.  Some generalized abdominal tenderness.  Medical Decision Making  Medically screening exam initiated at 7:00 PM.  Appropriate orders placed.  Erik A Mervin Hack was informed that the remainder of the evaluation will be completed by another provider, this initial triage assessment does not replace that evaluation, and the importance of remaining in the ED until their evaluation is complete.  Work-up initiated.   Pati Gallo [place], [place] 01/21/22 1905

## 2022-01-21 NOTE — ED Triage Notes (Signed)
Pt recently at [facility] for cellulitis, chills. Pt endorses extreme weakness. Pt had lactic, blood cultures and labs done yesterday. Pt lethargic and falling asleep in triage.

## 2022-01-21 NOTE — Progress Notes (Addendum)
Virtual Visit via Video Note  I connected withNAME@ on 01/21/22 at 11:00 AM EDT by a video enabled telemedicine application and verified that I am speaking with the correct person using two identifiers.  Location: Patient: Home  Provider: [facility]   I discussed the limitations of evaluation and management by telemedicine and the availability of in person appointments. The patient expressed understanding and agreed to proceed.  [place] for Infectious Disease  Patient Active Problem List   Diagnosis Date Noted   Pressure injury of skin 12/18/2021   Sepsis ([place]) 12/17/2021   Iron deficiency anemia 10/16/2021   Critical lower limb ischemia (HCC) 08/16/2021   Atherosclerosis of arteries of extremities ([place]) 08/15/2021   Coronary artery disease of native artery of native heart with stable angina pectoris ([place])    Leg wound, left 07/25/2021   S/P femoral-popliteal bypass surgery 11/22/2020   PAOD (peripheral arterial occlusive disease) ([place]) 11/22/2020   Non-ST elevation (NSTEMI) myocardial infarction ([place]) 05/23/2020   Orthostatic hypotension 01/01/2020   Statin myopathy 12/06/2019   Diarrhea 05/10/2018   PAF (paroxysmal atrial fibrillation) ([place]) 03/29/2018   Lacunar infarction ([place]) 03/29/2018   Unstable angina (HCC)    Chest pain 01/18/2018   CHF (congestive heart failure) ([place]) 10/21/2017   Anxiety 03/09/2017   Constipation 11/26/2016   History of non-ST elevation myocardial infarction (NSTEMI) 07/29/2016   Diabetes ([place]) 05/25/2016   Occlusion and stenosis of vertebral artery    Acute renal failure ([place]) 05/23/2016   Diabetic neuropathy ([place]) 01/13/2016   Depression 11/01/2015   Erectile dysfunction 01/23/2015   Morbid obesity ([place]) 01/23/2015   Periodontal disease 01/23/2015   Stopped smoking with greater than 30 pack year history 01/23/2015   Diabetic peripheral neuropathy associated with type 2 diabetes mellitus ([place]) 11/08/2014   Mucosal abnormality of stomach     History of adenomatous polyp of colon    GERD (gastroesophageal reflux disease) 09/06/2014   Low serum testosterone level 08/01/2014   DDD (degenerative disc disease), cervical 12/20/2013   Headache 08/07/2013   Medically noncompliant 10/25/2011   CAD S/P percutaneous coronary angioplasty 10/25/2011   MURMUR 04/16/2009   Hypertension associated with diabetes ([place]) 08/02/2008   Hyperlipidemia associated with type 2 diabetes mellitus ([place]) 07/31/2008   DISORDERS OF IRON METABOLISM 07/31/2008    ' Patient's Medications  New Prescriptions   No medications on file  Previous Medications   ACETAMINOPHEN (TYLENOL) 325 MG TABLET    Take 2 tablets (650 mg total) by mouth every 6 (six) hours as needed for mild pain (or Fever >/= 101).   ALBUTEROL (VENTOLIN HFA) 108 (90 BASE) MCG/ACT INHALER    Inhale 2 puffs into the lungs every 6 (six) hours as needed for wheezing or shortness of breath.   BUSPIRONE (BUSPAR) 7.5 MG TABLET    Take 1 tablet (7.5 mg total) by mouth 3 (three) times daily as needed.   CHLORPHEN-PHENYLEPH-ASA (ALKA-SELTZER PLUS COLD PO)    Take 2 tablets by mouth daily.   CLOPIDOGREL (PLAVIX) 75 MG TABLET    TAKE 1 TABLET BY MOUTH DAILY   DAPAGLIFLOZIN PROPANEDIOL (FARXIGA) 5 MG TABS TABLET    Take 1 tablet (5 mg total) by mouth daily before breakfast.   DOXYCYCLINE (VIBRAMYCIN) 100 MG CAPSULE    Take 1 capsule (100 mg total) by mouth 2 (two) times daily.   DULOXETINE (CYMBALTA) 30 MG CAPSULE    Take 3 capsules (90 mg total) by mouth daily.   ELIQUIS 5 MG TABS TABLET  TAKE ONE (1) TABLET BY MOUTH TWICE DAILY   EZETIMIBE (ZETIA) 10 MG TABLET    Take 1 tablet (10 mg total) by mouth daily.   FENOFIBRATE 160 MG TABLET    Take 1 tablet (160 mg total) by mouth daily.   FUROSEMIDE (LASIX) 20 MG TABLET    Take 20 mg by mouth daily.   GABAPENTIN (NEURONTIN) 300 MG CAPSULE    TAKE 1 CAPSULE BY MOUTH 3 TIMES DAILY   HYDROCODONE-ACETAMINOPHEN (NORCO) 10-325 MG TABLET    Take 1 tablet by mouth  every 4 (four) hours as needed for moderate pain.   INSULIN LISPRO (HUMALOG) 100 UNIT/ML KWIKPEN    INJECT 15-20 UNITS SUBCUTANEOUSLY THREE TIMES A DAY WITH MEALS   INSULIN NPH-REGULAR HUMAN (HUMULIN 70/30) (70-30) 100 UNIT/ML INJECTION    Inject 60 Units into the skin 2 (two) times daily with a meal.   INSULIN SYRINGE-NEEDLE U-100 28G X 1/2" 1 ML MISC    Use to give insulin five times daily Dx E11.42   ISOSORBIDE MONONITRATE (IMDUR) 30 MG 24 HR TABLET    TAKE 1 TABLET BY MOUTH DAILY   MENTHOL, TOPICAL ANALGESIC, (BLUE-EMU MAXIMUM STRENGTH EX)    Apply 1 application. topically daily as needed (pain).   METOPROLOL SUCCINATE (TOPROL-XL) 50 MG 24 HR TABLET    Take 1 tablet by mouth once daily   METOPROLOL TARTRATE PO    Take 50 mg by mouth as needed (afib).   NITROGLYCERIN (NITROSTAT) 0.4 MG SL TABLET    DISSOLVE ONE TABLET UNDER THE TONGUE EVERY 5 MINUTES AS NEEDED FOR CHEST PAIN.  DO NOT EXCEED A TOTAL OF 3 DOSES IN 15 MINUTES Strength: 0.4 mg   PANTOPRAZOLE (PROTONIX) 40 MG TABLET    TAKE 1 TABLET BY MOUTH DAILY   TIRZEPATIDE (MOUNJARO) 7.5 MG/0.5ML PEN    Inject 7.5 mg into the skin once a week.   VITAMIN B-12 (CYANOCOBALAMIN) 1000 MCG TABLET    Take 1,000 mcg by mouth daily.  Modified Medications   No medications on file  Discontinued Medications   No medications on file    History of Present Illness: 71 year old male with PMH of CAD s/p stents, PAD with multiple vascular intervention, most recently 4/23 stent  Stent of right tibioperoneal trunk and peroneal artery, atrial fibrillation on AC, anxiety/depression, DM, diastolic CHF, GERD, fibromyalgia, hyperlipidemia, hypertension, ACDF 2015, 07/2021 redo left femoral popliteal bypass graft who is here for HFU for recurrent group B streptococcus bacteremia. Patient has completed 10 days course of amoxicillin as prescribed at the time of discharge. This was supposed to be an in person visit but changed to videi due to transportation issues. Seen by  PCP on 8/11 and 8/25 after hospital discharge. Seen in the ED 9/5 for SOB/run of a fib. Wife reported fevers at home. Wife reported one area of scab in the lower leg where it seemed as if pus was coming and she was concerned about infection coming although skin exam at the ED was unremarkable. Patient was however given a course of doxycycline empirically to cover for leg infection. Seen with PCP same day afternoon and recommended to complete abtx course.   Per wife, he had low grade fevers( 97-82F). Also some  nausea and non bloody vomiting which has resolved. Denies any cough, SOB and chest pain. Denies GU symptoms. Denies abdominal pain and diarrhea. Denies any obvious redness, swelling or tenderness in the legs. Denies rashes. She will send picture of legs via my chart.  ROS all systems reviewed with pertinent positives and negatives as listed above  Past Medical History:  Diagnosis Date   Anxiety    Arthritis    Atrial fibrillation (HCC)    CAD (coronary artery disease)    a. 2010: DES to CTO of RCA. EF 55% b. 07/2016: cath showing total occlusion within previously placed RCA stent (collaterals present), severe stenosis along LCx and OM1 (treated with 2 overlapping DES). c. repeat cath in 01/2018 showing patent stents along LCx and OM with CTO of D2, CTO of distal LCx, and CTO of RCA with collaterals present overall unchanged since 2018 with medical management recom   Cellulitis and abscess rt groin    Complication of anesthesia    " I woke up during a colonoscopy "      Depression    Diabetes mellitus    Diastolic CHF ([place])    Disorders of iron metabolism    Dysrhythmia    Fibromyalgia    GERD (gastroesophageal reflux disease)    History of hiatal hernia    Hyperlipidemia    Hypertension    Low serum testosterone level    Medically noncompliant    Myocardial infarction ([place])    05-23-20   Pneumonia    Past Surgical History:  Procedure Laterality Date   ABDOMINAL AORTOGRAM  W/LOWER EXTREMITY N/A 03/21/2019   Procedure: ABDOMINAL AORTOGRAM W/LOWER EXTREMITY;  Surgeon: [name], MD;  Location: [place] [facility];  Service: Cardiovascular;  Laterality: N/A;   ABDOMINAL AORTOGRAM W/LOWER EXTREMITY N/A 11/12/2020   Procedure: ABDOMINAL AORTOGRAM W/LOWER EXTREMITY;  Surgeon: [name], MD;  Location: [place] [facility];  Service: Cardiovascular;  Laterality: N/A;   ABDOMINAL AORTOGRAM W/LOWER EXTREMITY N/A 01/28/2021   Procedure: ABDOMINAL AORTOGRAM W/LOWER EXTREMITY;  Surgeon: [name], MD;  Location: [place] [facility];  Service: Cardiovascular;  Laterality: N/A;   ABDOMINAL AORTOGRAM W/LOWER EXTREMITY Bilateral 07/29/2021   Procedure: ABDOMINAL AORTOGRAM W/LOWER EXTREMITY;  Surgeon: [name], MD;  Location: [place] [facility];  Service: Cardiovascular;  Laterality: Bilateral;   ANGIOPLASTY N/A 08/15/2021   Procedure: ATTEMPTED RIGHT PERONEAL  ANGIOPLASTY, LEFT PERONEAL ANGIOPLASTY;  Surgeon: [name], MD;  Location: [facility];  Service: Vascular;  Laterality: N/A;   BACK SURGERY  2015   ACDF by Dr. Orland Jarred STUDY  12/23/2021   Procedure: BUBBLE STUDY;  Surgeon: Elouise Munroe, MD;  Location: [facility] END[facility];  Service: Cardiology;;   COLONOSCOPY N/A 10/01/2014   Dr. [name]: multiple tubular adenomas removed, colonic diverticulosis, redundant colon. next tcs advised for 09/2017. PATIENT NEEDS PROPOFOL FOR FAILED CONSCIOUS SEDATION   CORONARY STENT INTERVENTION N/A 07/30/2016   Procedure: Coronary Stent Intervention;  Surgeon: [name], MD;  Location: [place] [facility];  Service: Cardiovascular;  Laterality: N/A;   CORONARY STENT PLACEMENT  2000   By Dr. Olevia Perches   EP IMPLANTABLE DEVICE N/A 05/25/2016   Procedure: Loop Recorder Insertion;  Surgeon: [name], MD;  Location: [place] [facility];  Service: Cardiovascular;  Laterality: N/A;   ESOPHAGOGASTRODUODENOSCOPY     esophagus stretched remotely at [facility]    ESOPHAGOGASTRODUODENOSCOPY N/A 10/01/2014   Dr. [name]: patchy mottling/erythema and minimal polypoid appearance of gastric mucosa. bx with mild inlammation but no H.pylori   FEMORAL-POPLITEAL BYPASS GRAFT Left 11/22/2020   Procedure: LEFT FEMORAL-POPLITEAL BYPASS GRAFT;  Surgeon: [name], MD;  [facility];  Service: Vascular;  Laterality: Left;   FEMORAL-POPLITEAL BYPASS GRAFT Left  08/15/2021   Procedure: REDO LEFT FEMORAL-POPLITEAL BYPASS USING PROPATEN GRAFT;  Surgeon: [name], MD;  Location: [place];  Service: Vascular;  Laterality: Left;   HERNIA REPAIR  1191   umbilical   INSERTION OF ILIAC STENT Right 11/22/2020   Procedure: INSERTION OF ELUVIA STENT INTO RIGHT DISTAL SUPERFICIAL FEMORAL ARTERY;  Surgeon: [name], MD;  Location: [place];  Service: Vascular;  Laterality: Right;   LEFT HEART CATH AND CORONARY ANGIOGRAPHY N/A 07/30/2016   Procedure: Left Heart Cath and Coronary Angiography;  Surgeon: [name], MD;  Location: [place] [facility];  Service: Cardiovascular;  Laterality: N/A;   LEFT HEART CATH AND CORONARY ANGIOGRAPHY N/A 01/19/2018   Procedure: LEFT HEART CATH AND CORONARY ANGIOGRAPHY;  Surgeon: [name], MD;  Location: [place] [facility];  Service: Cardiovascular;  Laterality: N/A;   LEFT HEART CATH AND CORONARY ANGIOGRAPHY N/A 05/24/2020   Procedure: LEFT HEART CATH AND CORONARY ANGIOGRAPHY;  Surgeon: Burnell Blanks, MD;  Location: [place] [facility];  Service: Cardiovascular;  Laterality: N/A;   LEFT HEART CATH AND CORONARY ANGIOGRAPHY N/A 08/06/2021   Procedure: LEFT HEART CATH AND CORONARY ANGIOGRAPHY;  Surgeon: Burnell Blanks, MD;  Location: [place] [facility];  Service: Cardiovascular;  Laterality: N/A;   LESION REMOVAL     Lip and hand    LOWER EXTREMITY ANGIOGRAM Right 11/22/2020   Procedure: RIGHT LEG ANGIOGRAM;  Surgeon: [name], MD;  [facility];  Service: Vascular;  Laterality: Right;   LOWER EXTREMITY  ANGIOGRAM Right 08/15/2021   Procedure: RIGHT LOWER EXTREMITY ANGIOGRAM;  Surgeon: [name], MD;  [facility];  Service: Vascular;  Laterality: Right;   LOWER EXTREMITY ANGIOGRAPHY N/A 04/18/2019   Procedure: LOWER EXTREMITY ANGIOGRAPHY;  Surgeon: [name], MD;  Location: [place] [facility];  Service: Cardiovascular;  Laterality: N/A;   LOWER EXTREMITY ANGIOGRAPHY N/A 08/18/2021   Procedure: Lower Extremity Angiography;  Surgeon: Waynetta Sandy, MD;  Location: [place] [facility];  Service: Cardiovascular;  Laterality: N/A;   NECK SURGERY     PERIPHERAL VASCULAR BALLOON ANGIOPLASTY  04/18/2019   Procedure: PERIPHERAL VASCULAR BALLOON ANGIOPLASTY;  Surgeon: [name], MD;  Location: [place] [facility];  Service: Cardiovascular;;   PERIPHERAL VASCULAR BALLOON ANGIOPLASTY Left 11/12/2020   Procedure: PERIPHERAL VASCULAR BALLOON ANGIOPLASTY;  Surgeon: [name], MD;  Location: [place] [facility];  Service: Cardiovascular;  Laterality: Left;  Failed PTA of superficial femoral artery.   PERIPHERAL VASCULAR BALLOON ANGIOPLASTY Right 08/18/2021   Procedure: PERIPHERAL VASCULAR BALLOON ANGIOPLASTY;  Surgeon: Waynetta Sandy, MD;  Location: [place] [facility];  Service: Cardiovascular;  Laterality: Right;  peroneal   PERIPHERAL VASCULAR INTERVENTION Right 03/21/2019   Procedure: PERIPHERAL VASCULAR INTERVENTION;  Surgeon: [name], MD;  Location: [place] [facility];  Service: Cardiovascular;  Laterality: Right;  superficial femoral   PERIPHERAL VASCULAR INTERVENTION Right 08/18/2021   Procedure: PERIPHERAL VASCULAR INTERVENTION;  Surgeon: Waynetta Sandy, MD;  Location: [place] [facility];  Service: Cardiovascular;  Laterality: Right;  tibial peroneal trunk   TEE WITHOUT CARDIOVERSION N/A 12/23/2021   Procedure: TRANSESOPHAGEAL ECHOCARDIOGRAM (TEE);  Surgeon: Elouise Munroe, MD;  Location: [place];  Service: Cardiology;  Laterality: N/A;      Social History   Tobacco Use   Smoking status: Former    Packs/day: 0.25    Years: 51.00    Total pack years: 12.75    Types:  Cigarettes    Quit date: 08/14/2016    Years since quitting: 5.4   Smokeless tobacco: Never   Tobacco comments:    smokes  a pack a week  Vaping Use   Vaping Use: Never used  Substance Use Topics   Alcohol use: No    Alcohol/week: 0.0 standard drinks of alcohol   Drug use: No    Family History  Problem Relation Age of Onset   Diabetes Father    Valvular heart disease Father    Arthritis Father    Heart disease Father    Stroke Father    Alzheimer's disease Mother    Hyperlipidemia Mother    Hypertension Mother    Arthritis Mother    Lung cancer Mother    Stroke Mother    Headache Mother    Arthritis/Rheumatoid Sister    Diabetes Sister    Hypertension Sister    Hyperlipidemia Sister    Depression Sister    Dementia Maternal Aunt    Dementia Maternal Uncle    Heart disease Maternal Uncle    Stomach cancer Paternal Uncle    Colon cancer Neg Hx    Liver disease Neg Hx     Allergies  Allergen Reactions   Shellfish Allergy Anaphylaxis and Other (See Comments)    Tongue swelling, hives    Sulfa Antibiotics Anaphylaxis and Rash    Tongue swelling, hives   Ace Inhibitors Other (See Comments) and Cough    CKD, renal failure    Escitalopram Other (See Comments)    Buzzing in ears,headache, felt like a zombie    Evolocumab Other (See Comments)    Myalgias, flu like sx    Fenofibrate Other (See Comments)    Body aches - pt currently taking isnt sure if its causing any pain    Invokana [Canagliflozin] Other (See Comments)    Syncope / dehydration   Lisinopril Cough   Metformin And Related Itching   Pravastatin Sodium Other (See Comments)    myalgias   Crestor [Rosuvastatin] Other (See Comments)    Myalgias    Horse-Derived Products Rash    horse serum   Lexapro [Escitalopram Oxalate] Other (See Comments)    Buzzing in  ears,headache, felt like a zombie   Lipitor [Atorvastatin] Other (See Comments)    myalgias   Livalo [Pitavastatin] Other (See Comments)    Myalgias    Milk (Cow) Nausea Only    Ties stomach in knots     Tape Rash    Health Maintenance  Topic Date Due   COVID-19 Vaccine (3 - Moderna risk series) 09/19/2019   URINE MICROALBUMIN  02/21/2022   Zoster Vaccines- Shingrix (1 of 2) 02/13/2022 (Originally 10/11/1969)   INFLUENZA VACCINE  08/16/2022 (Originally 12/16/2021)   Pneumonia Vaccine 10+ Years old (1 - PCV) 11/14/2022 (Originally 10/12/2015)   FOOT EXAM  06/23/2022   HEMOGLOBIN A1C  06/28/2022   OPHTHALMOLOGY EXAM  07/01/2022   COLONOSCOPY (Pts 45-53yr Insurance coverage will need to be confirmed)  09/30/2024   TETANUS/TDAP  09/16/2025   Hepatitis C Screening  Completed   HPV VACCINES  Aged Out    Observations/Objective: Looks tired  No obvious redness.swelling in the legs bilaterally, seems to be chronic wounds/dry gangrene  Assessment and Plan: # Recurrent Group B streptococcus bacteremia- possible source could be bilateral foot pressure ulcers and dry gangerene but wounds improving per wife and vascular. R/o endocarditis  - Previous Group B strep bacteremia in 07/25/21, no repeat blood cx drawn  for clearance,  treated with Vancomycin> Pen G> Amoxicillin for 10 days on discharge. TTE 3/14 mild calcification of the  aortic valve. There is mild thickening of the aortic valve. Aortic valve  - Loop recorder + - TEE  8/8 No vegetations. Moderate-severe to severe mitral valve regurgitation, likely functional MR with multiple commissural jets and lack of complete coaptation. Possible systolic flow reversal in the RUPV.  Moderate aortic valve regurgitation.    # PAD s/p multiple vascular intervention  # Chronic dry gangrene of left 1st, 2nd, 3rd and 4th toes and rt 5th toe with multiple pressure ulcers   Plan I did not see any obvious redness, swelling or signs of infection in  bilateral legs from video visit. However, this is a limited exam and requested patient's wife if they can come in person or send pictures of legs via my chart   Complete course of doxycycline given in the ED  Fu blood cx 9/5 in ED NG in 1 day. Urine cx 9/5 3000 colonies of e faecalis, unremarkable UA with no GU symptoms, no indication to treat   I have advised them to go to ED in case he has fevers, persistent nausea and vomiting and gets worse. Otherwise, I will fu him in a week   I discussed the assessment and treatment plan with the patient. The patient was provided an opportunity to ask questions and all were answered. The patient agreed with the plan and demonstrated an understanding of the instructions.   The patient was advised to call back or seek an in-person evaluation if the symptoms worsen or if the condition fails to improve as anticipated.  I provided 35 minutes of non-face-to-face time during this encounter.  Wilber Oliphant, [place] for [facility] [phone] pager   [phone] cell 01/21/2022, 11:13 AM

## 2022-01-21 NOTE — Progress Notes (Signed)
Pharmacy Antibiotic Note  [name] [name] is a 71 y.o. male for which pharmacy has been consulted for vancomycin and cefepime dosing for sepsis.  Patient with a history of Recurrent Group B streptococcus bacteremia possibly secondary to bilateral foot pressure ulcers and dry gangrene. Patient is seen by ID.  SCr 1.91 - ~1.5 baseline WBC 20.9; LA 3; T 99.2; HR 99; RR 24  Plan: Cefepime 2g q12hr Vancomycin 2000 mg once then 1250 mg q24hr (eAUC 530) unless change in renal function Flagyl '500mg'$  x1 Trend WBC, Fever, Renal function F/u cultures, clinical course, WBC, fever De-escalate when able F/u ID recommendations  Height: '6\' 2"'$  (188 cm) Weight: 122 kg (268 lb 15.4 oz) IBW/kg (Calculated) : 82.2  Temp (24hrs), Avg:99.2 F (37.3 C), Min:99.2 F (37.3 C), Max:99.2 F (37.3 C)  Recent Labs  Lab 01/20/22 0129 01/20/22 0433 01/21/22 1911  WBC 10.4  --  20.9*  CREATININE 1.57*  --  1.91*  LATICACIDVEN 1.8 1.4 3.0*     Estimated Creatinine Clearance: 49.2 mL/min (A) (by C-G formula based on SCr of 1.91 mg/dL (H)).    Allergies  Allergen Reactions   Shellfish Allergy Anaphylaxis and Other (See Comments)    Tongue swelling, hives    Sulfa Antibiotics Anaphylaxis and Rash    Tongue swelling, hives   Ace Inhibitors Other (See Comments) and Cough    CKD, renal failure    Escitalopram Other (See Comments)    Buzzing in ears,headache, felt like a zombie    Evolocumab Other (See Comments)    Myalgias, flu like sx    Fenofibrate Other (See Comments)    Body aches - pt currently taking isnt sure if its causing any pain    Invokana [Canagliflozin] Other (See Comments)    Syncope / dehydration   Lisinopril Cough   Metformin And Related Itching   Pravastatin Sodium Other (See Comments)    myalgias   Crestor [Rosuvastatin] Other (See Comments)    Myalgias    Horse-Derived Products Rash    horse serum   Lexapro [Escitalopram Oxalate] Other (See Comments)    Buzzing in  ears,headache, felt like a zombie   Lipitor [Atorvastatin] Other (See Comments)    myalgias   Livalo [Pitavastatin] Other (See Comments)    Myalgias    Milk (Cow) Nausea Only    Ties stomach in knots     Tape Rash    Antimicrobials this admission: 9/6 Cefepime 9/6 Vanc 9/6 flagyl  Microbiology results: 9/6 BCx >> pending 9/6 UCx >> 3,000 colonies E. faecalis  Thank you for allowing pharmacy to be a part of this patient's care.  Sandford Craze, PharmD. [facility] Acute Care PGY-1 01/21/2022 10:51 PM

## 2022-01-22 ENCOUNTER — Inpatient Hospital Stay (HOSPITAL_COMMUNITY): Payer: Medicare Other

## 2022-01-22 DIAGNOSIS — I2510 Atherosclerotic heart disease of native coronary artery without angina pectoris: Secondary | ICD-10-CM | POA: Diagnosis not present

## 2022-01-22 DIAGNOSIS — F419 Anxiety disorder, unspecified: Secondary | ICD-10-CM | POA: Diagnosis not present

## 2022-01-22 DIAGNOSIS — E872 Acidosis, unspecified: Secondary | ICD-10-CM

## 2022-01-22 DIAGNOSIS — R6510 Systemic inflammatory response syndrome (SIRS) of non-infectious origin without acute organ dysfunction: Secondary | ICD-10-CM | POA: Diagnosis not present

## 2022-01-22 DIAGNOSIS — K8050 Calculus of bile duct without cholangitis or cholecystitis without obstruction: Secondary | ICD-10-CM

## 2022-01-22 DIAGNOSIS — Z7189 Other specified counseling: Secondary | ICD-10-CM

## 2022-01-22 DIAGNOSIS — R112 Nausea with vomiting, unspecified: Secondary | ICD-10-CM

## 2022-01-22 DIAGNOSIS — R7989 Other specified abnormal findings of blood chemistry: Secondary | ICD-10-CM

## 2022-01-22 DIAGNOSIS — E1165 Type 2 diabetes mellitus with hyperglycemia: Secondary | ICD-10-CM

## 2022-01-22 DIAGNOSIS — N179 Acute kidney failure, unspecified: Secondary | ICD-10-CM | POA: Diagnosis not present

## 2022-01-22 DIAGNOSIS — K219 Gastro-esophageal reflux disease without esophagitis: Secondary | ICD-10-CM

## 2022-01-22 LAB — RESP PANEL BY RT-PCR (FLU A&B, COVID) ARPGX2
Influenza A by PCR: NEGATIVE
Influenza B by PCR: NEGATIVE
SARS Coronavirus 2 by RT PCR: NEGATIVE

## 2022-01-22 LAB — CBC
HCT: 36.3 % — ABNORMAL LOW (ref 39.0–52.0)
Hemoglobin: 10.4 g/dL — ABNORMAL LOW (ref 13.0–17.0)
MCH: 23.1 pg — ABNORMAL LOW (ref 26.0–34.0)
MCHC: 28.7 g/dL — ABNORMAL LOW (ref 30.0–36.0)
MCV: 80.5 fL (ref 80.0–100.0)
Platelets: 303 10*3/uL (ref 150–400)
RBC: 4.51 MIL/uL (ref 4.22–5.81)
RDW: 16.3 % — ABNORMAL HIGH (ref 11.5–15.5)
WBC: 19 10*3/uL — ABNORMAL HIGH (ref 4.0–10.5)
nRBC: 0.1 % (ref 0.0–0.2)

## 2022-01-22 LAB — LACTIC ACID, PLASMA
Lactic Acid, Venous: 3 mmol/L (ref 0.5–1.9)
Lactic Acid, Venous: 3.3 mmol/L (ref 0.5–1.9)
Lactic Acid, Venous: 2.1 mmol/L (ref 0.5–1.9)

## 2022-01-22 LAB — I-STAT VENOUS BLOOD GAS, ED
Acid-base deficit: 3 mmol/L — ABNORMAL HIGH (ref 0.0–2.0)
Bicarbonate: 23.5 mmol/L (ref 20.0–28.0)
Calcium, Ion: 1.11 mmol/L — ABNORMAL LOW (ref 1.15–1.40)
HCT: 34 % — ABNORMAL LOW (ref 39.0–52.0)
Hemoglobin: 11.6 g/dL — ABNORMAL LOW (ref 13.0–17.0)
O2 Saturation: 65 %
Potassium: 4.1 mmol/L (ref 3.5–5.1)
Sodium: 135 mmol/L (ref 135–145)
TCO2: 25 mmol/L (ref 22–32)
pCO2, Ven: 47 mmHg (ref 44–60)
pH, Ven: 7.306 (ref 7.25–7.43)
pO2, Ven: 37 mmHg (ref 32–45)

## 2022-01-22 LAB — COMPREHENSIVE METABOLIC PANEL
ALT: 318 U/L — ABNORMAL HIGH (ref 0–44)
ALT: 720 U/L — ABNORMAL HIGH (ref 0–44)
AST: 522 U/L — ABNORMAL HIGH (ref 15–41)
AST: 1516 U/L — ABNORMAL HIGH (ref 15–41)
Albumin: 3.4 g/dL — ABNORMAL LOW (ref 3.5–5.0)
Albumin: 3.2 g/dL — ABNORMAL LOW (ref 3.5–5.0)
Alkaline Phosphatase: 48 U/L (ref 38–126)
Alkaline Phosphatase: 44 U/L (ref 38–126)
Anion gap: 14 (ref 5–15)
Anion gap: 12 (ref 5–15)
BUN: 30 mg/dL — ABNORMAL HIGH (ref 8–23)
BUN: 29 mg/dL — ABNORMAL HIGH (ref 8–23)
CO2: 21 mmol/L — ABNORMAL LOW (ref 22–32)
CO2: 22 mmol/L (ref 22–32)
Calcium: 8.9 mg/dL (ref 8.9–10.3)
Calcium: 8.6 mg/dL — ABNORMAL LOW (ref 8.9–10.3)
Chloride: 98 mmol/L (ref 98–111)
Chloride: 101 mmol/L (ref 98–111)
Creatinine, Ser: 1.81 mg/dL — ABNORMAL HIGH (ref 0.61–1.24)
Creatinine, Ser: 1.68 mg/dL — ABNORMAL HIGH (ref 0.61–1.24)
GFR, Estimated: 39 mL/min — ABNORMAL LOW (ref >60)
GFR, Estimated: 43 mL/min — ABNORMAL LOW (ref >60)
Glucose, Bld: 173 mg/dL — ABNORMAL HIGH (ref 70–99)
Glucose, Bld: 155 mg/dL — ABNORMAL HIGH (ref 70–99)
Potassium: 4.3 mmol/L (ref 3.5–5.1)
Potassium: 4.2 mmol/L (ref 3.5–5.1)
Sodium: 133 mmol/L — ABNORMAL LOW (ref 135–145)
Sodium: 135 mmol/L (ref 135–145)
Total Bilirubin: 2.3 mg/dL — ABNORMAL HIGH (ref 0.3–1.2)
Total Bilirubin: 1.8 mg/dL — ABNORMAL HIGH (ref 0.3–1.2)
Total Protein: 7.3 g/dL (ref 6.5–8.1)
Total Protein: 7 g/dL (ref 6.5–8.1)

## 2022-01-22 LAB — CK: Total CK: 180 U/L (ref 49–397)

## 2022-01-22 LAB — URINALYSIS, ROUTINE W REFLEX MICROSCOPIC
Bacteria, UA: NONE SEEN
Bilirubin Urine: NEGATIVE
Glucose, UA: >=500 mg/dL — AB
Ketones, ur: 5 mg/dL — AB
Leukocytes,Ua: NEGATIVE
Nitrite: NEGATIVE
Protein, ur: 100 mg/dL — AB
Specific Gravity, Urine: 1.03 (ref 1.005–1.030)
pH: 6 (ref 5.0–8.0)

## 2022-01-22 LAB — HEPATITIS PANEL, ACUTE
HCV Ab: NONREACTIVE
Hep A IgM: NONREACTIVE
Hep B C IgM: NONREACTIVE
Hepatitis B Surface Ag: NONREACTIVE

## 2022-01-22 LAB — URINE CULTURE: Culture: 3000 — AB

## 2022-01-22 LAB — BRAIN NATRIURETIC PEPTIDE: B Natriuretic Peptide: 604.7 pg/mL — ABNORMAL HIGH (ref 0.0–100.0)

## 2022-01-22 LAB — CBG MONITORING, ED
Glucose-Capillary: 248 mg/dL — ABNORMAL HIGH (ref 70–99)
Glucose-Capillary: 179 mg/dL — ABNORMAL HIGH (ref 70–99)
Glucose-Capillary: 149 mg/dL — ABNORMAL HIGH (ref 70–99)

## 2022-01-22 LAB — APTT: aPTT: 63 seconds — ABNORMAL HIGH (ref 24–36)

## 2022-01-22 LAB — HEPARIN LEVEL (UNFRACTIONATED): Heparin Unfractionated: >1.1 IU/mL — ABNORMAL HIGH (ref 0.30–0.70)

## 2022-01-22 LAB — PROTIME-INR
INR: 2.3 — ABNORMAL HIGH (ref 0.8–1.2)
Prothrombin Time: 24.9 seconds — ABNORMAL HIGH (ref 11.4–15.2)

## 2022-01-22 LAB — PROCALCITONIN: Procalcitonin: 0.47 ng/mL

## 2022-01-22 LAB — MRSA NEXT GEN BY PCR, NASAL: MRSA by PCR Next Gen: NOT DETECTED

## 2022-01-22 LAB — TROPONIN I (HIGH SENSITIVITY): Troponin I (High Sensitivity): 454 ng/L (ref <18)

## 2022-01-22 LAB — GLUCOSE, CAPILLARY: Glucose-Capillary: 136 mg/dL — ABNORMAL HIGH (ref 70–99)

## 2022-01-22 LAB — CORTISOL-AM, BLOOD: Cortisol - AM: 33.2 ug/dL — ABNORMAL HIGH (ref 6.7–22.6)

## 2022-01-22 MED ORDER — HEPARIN (PORCINE) 25000 UT/250ML-% IV SOLN
2050.0000 [IU]/h | INTRAVENOUS | Status: DC
  Administered 2022-01-22: 1600 [IU]/h via INTRAVENOUS
  Administered 2022-01-23: 1750 [IU]/h via INTRAVENOUS
  Administered 2022-01-23 – 2022-01-25 (×4): 1950 [IU]/h via INTRAVENOUS
  Filled 2022-01-22 (×7): qty 250

## 2022-01-22 MED ORDER — ONDANSETRON HCL 4 MG PO TABS: 4.0000 mg | ORAL_TABLET | Freq: Four times a day (QID) | ORAL | Status: DC | PRN

## 2022-01-22 MED ORDER — PANTOPRAZOLE SODIUM 40 MG PO TBEC
40.0000 mg | DELAYED_RELEASE_TABLET | Freq: Every day | ORAL | Status: DC
  Administered 2022-01-22 – 2022-01-23 (×2): 40 mg via ORAL
  Filled 2022-01-22 (×2): qty 1

## 2022-01-22 MED ORDER — APIXABAN 5 MG PO TABS: 5.0000 mg | ORAL_TABLET | Freq: Two times a day (BID) | ORAL | Status: DC

## 2022-01-22 MED ORDER — SODIUM CHLORIDE 0.9 % IV BOLUS
1000.0000 mL | Freq: Once | INTRAVENOUS | Status: AC
  Administered 2022-01-22: 1000 mL via INTRAVENOUS

## 2022-01-22 MED ORDER — CLOPIDOGREL BISULFATE 75 MG PO TABS: 75.0000 mg | ORAL_TABLET | Freq: Every day | ORAL | Status: DC

## 2022-01-22 MED ORDER — ONDANSETRON HCL 4 MG/2ML IJ SOLN: 4.0000 mg | Freq: Four times a day (QID) | INTRAMUSCULAR | Status: DC | PRN

## 2022-01-22 MED ORDER — ACETAMINOPHEN 325 MG PO TABS
650.0000 mg | ORAL_TABLET | Freq: Four times a day (QID) | ORAL | Status: DC | PRN
  Administered 2022-01-22 – 2022-01-25 (×6): 650 mg via ORAL
  Filled 2022-01-22 (×6): qty 2

## 2022-01-22 MED ORDER — METOPROLOL SUCCINATE ER 50 MG PO TB24
50.0000 mg | ORAL_TABLET | Freq: Every day | ORAL | Status: DC
  Administered 2022-01-22 – 2022-01-26 (×5): 50 mg via ORAL
  Filled 2022-01-22 (×2): qty 1
  Filled 2022-01-22: qty 2
  Filled 2022-01-22 (×2): qty 1

## 2022-01-22 MED ORDER — INSULIN ASPART 100 UNIT/ML IJ SOLN
0.0000 [IU] | Freq: Three times a day (TID) | INTRAMUSCULAR | Status: DC
  Administered 2022-01-22: 3 [IU] via SUBCUTANEOUS
  Administered 2022-01-22: 7 [IU] via SUBCUTANEOUS
  Administered 2022-01-22 – 2022-01-23 (×2): 4 [IU] via SUBCUTANEOUS
  Administered 2022-01-23: 3 [IU] via SUBCUTANEOUS
  Administered 2022-01-23: 4 [IU] via SUBCUTANEOUS
  Administered 2022-01-24: 7 [IU] via SUBCUTANEOUS
  Administered 2022-01-24: 11 [IU] via SUBCUTANEOUS
  Administered 2022-01-24 – 2022-01-25 (×3): 7 [IU] via SUBCUTANEOUS
  Administered 2022-01-25: 4 [IU] via SUBCUTANEOUS
  Administered 2022-01-26: 7 [IU] via SUBCUTANEOUS
  Administered 2022-01-26: 4 [IU] via SUBCUTANEOUS

## 2022-01-22 MED ORDER — EZETIMIBE 10 MG PO TABS
10.0000 mg | ORAL_TABLET | Freq: Every day | ORAL | Status: DC
  Administered 2022-01-22 – 2022-01-26 (×5): 10 mg via ORAL
  Filled 2022-01-22 (×5): qty 1

## 2022-01-22 MED ORDER — GABAPENTIN 300 MG PO CAPS
300.0000 mg | ORAL_CAPSULE | Freq: Three times a day (TID) | ORAL | Status: DC
  Administered 2022-01-22 – 2022-01-26 (×13): 300 mg via ORAL
  Filled 2022-01-22 (×13): qty 1

## 2022-01-22 MED ORDER — LACTATED RINGERS IV SOLN: INTRAVENOUS | Status: DC

## 2022-01-22 MED ORDER — SODIUM CHLORIDE 0.9 % IV SOLN: 2.0000 g | Freq: Once | INTRAVENOUS | Status: DC

## 2022-01-22 MED ORDER — INSULIN ASPART 100 UNIT/ML IJ SOLN
0.0000 [IU] | Freq: Every day | INTRAMUSCULAR | Status: DC
  Administered 2022-01-23 – 2022-01-25 (×3): 2 [IU] via SUBCUTANEOUS

## 2022-01-22 MED ORDER — BUSPIRONE HCL 15 MG PO TABS
7.5000 mg | ORAL_TABLET | Freq: Three times a day (TID) | ORAL | Status: DC | PRN
  Administered 2022-01-25: 7.5 mg via ORAL
  Filled 2022-01-22: qty 1

## 2022-01-22 MED ORDER — ACETAMINOPHEN 650 MG RE SUPP: 650.0000 mg | Freq: Four times a day (QID) | RECTAL | Status: DC | PRN

## 2022-01-22 MED ORDER — ISOSORBIDE MONONITRATE ER 30 MG PO TB24
30.0000 mg | ORAL_TABLET | Freq: Every day | ORAL | Status: DC
  Administered 2022-01-22 – 2022-01-26 (×5): 30 mg via ORAL
  Filled 2022-01-22 (×5): qty 1

## 2022-01-22 MED ORDER — DULOXETINE HCL 60 MG PO CPEP
90.0000 mg | ORAL_CAPSULE | Freq: Every day | ORAL | Status: DC
  Administered 2022-01-22 – 2022-01-26 (×5): 90 mg via ORAL
  Filled 2022-01-22 (×3): qty 1
  Filled 2022-01-22: qty 3
  Filled 2022-01-22: qty 1

## 2022-01-22 MED ORDER — HYDROCODONE-ACETAMINOPHEN 10-325 MG PO TABS
1.0000 | ORAL_TABLET | Freq: Once | ORAL | Status: AC
  Administered 2022-01-23: 1 via ORAL
  Filled 2022-01-22: qty 1

## 2022-01-22 MED ORDER — CLOPIDOGREL BISULFATE 75 MG PO TABS
75.0000 mg | ORAL_TABLET | Freq: Every day | ORAL | Status: DC
  Administered 2022-01-23 – 2022-01-24 (×2): 75 mg via ORAL
  Filled 2022-01-22 (×2): qty 1

## 2022-01-22 MED ORDER — VANCOMYCIN HCL IN DEXTROSE 1-5 GM/200ML-% IV SOLN: 1000.0000 mg | Freq: Once | INTRAVENOUS | Status: DC

## 2022-01-22 MED ORDER — NITROGLYCERIN 0.4 MG SL SUBL: 0.4000 mg | SUBLINGUAL_TABLET | SUBLINGUAL | Status: DC | PRN

## 2022-01-22 MED ORDER — METRONIDAZOLE 500 MG/100ML IV SOLN
500.0000 mg | Freq: Two times a day (BID) | INTRAVENOUS | Status: DC
  Administered 2022-01-22 – 2022-01-23 (×3): 500 mg via INTRAVENOUS
  Filled 2022-01-22 (×3): qty 100

## 2022-01-22 MED ORDER — TECHNETIUM TC 99M MEBROFENIN IV KIT
7.6000 | PACK | Freq: Once | INTRAVENOUS | Status: AC | PRN
  Administered 2022-01-22: 7.6 via INTRAVENOUS

## 2022-01-22 NOTE — Progress Notes (Addendum)
HIDA scan negative.  Lactic acid improved to 2.1.  VBG reassuring.  Cr improving.  LFT rising.  CK normal.  Total bili 1.8.  ALP normal.  BNP 605.  Troponin 454.  Patient without chest pain.  Already on heparin and Plavix.  Hemodynamically stable.  Source of infection unclear.  Remains on broad-spectrum antibiotics.  Sent secure chart to [facility] for rising LFT. Follow repeat troponin.  May consult cardiology if significant rise.  Follow repeat lactic acid as well.  Resume Plavix now HIDA is negative.  Continue IV heparin and instead of home Eliquis.

## 2022-01-22 NOTE — Progress Notes (Signed)
ANTICOAGULATION CONSULT NOTE  Pharmacy Consult for Heparin Indication: atrial fibrillation Brief A/P: aPTT subtherapeutic Increase Heparin rate  Allergies  Allergen Reactions   Shellfish Allergy Anaphylaxis, Hives and Swelling    Tongue swelling    Sulfa Antibiotics Anaphylaxis, Hives and Swelling    Tongue swelling   Ace Inhibitors Other (See Comments) and Cough    CKD, renal failure    Invokana [Canagliflozin] Other (See Comments)    Syncope Dehydration    Lexapro [Escitalopram] Other (See Comments)    Buzzing in ears Headaches "Felt like a zombie"   Metformin And Related Itching   Pravachol [Pravastatin Sodium] Other (See Comments)    Myalgias    Repatha [Evolocumab] Other (See Comments)    Myalgias Flu like symptoms    Tricor [Fenofibrate] Other (See Comments)    Myalgias     Zestril [Lisinopril] Cough   Crestor [Rosuvastatin] Other (See Comments)    Myalgias    Horse-Derived Products Rash    horse serum   Lipitor [Atorvastatin] Other (See Comments)    Myalgias    Livalo [Pitavastatin] Other (See Comments)    Myalgias    Milk (Cow) Diarrhea and Nausea Only    Stomach upset    Tape Rash    Patient Measurements: Height: '6\' 2"'$  (188 cm) Weight: 129.4 kg (285 lb 4.4 oz) IBW/kg (Calculated) : 82.2 Heparin Dosing Weight: 109 kg  Vital Signs: Temp: 98 F (36.7 C) (09/07 2316) Temp Source: Oral (09/07 2316) BP: 130/88 (09/07 2316) Pulse Rate: 93 (09/07 2316)  Labs: Recent Labs    01/20/22 0129 01/20/22 0433 01/21/22 1911 01/22/22 0450 01/22/22 1433 01/22/22 1439 01/22/22 2251  HGB 11.1*  --  11.5* 10.4*  --  11.6*  --   HCT 37.8*  --  38.2* 36.3*  --  34.0*  --   PLT 301  --  348 303  --   --   --   APTT 49*  --  50*  --   --   --  63*  LABPROT 16.6*  --  22.3* 24.9*  --   --   --   INR 1.4*  --  2.0* 2.3*  --   --   --   HEPARINUNFRC  --   --   --   --   --   --  >1.10*  CREATININE 1.57*  --  1.91* 1.81* 1.68*  --   --   CKTOTAL  --   --   --   180  --   --   --   TROPONINIHS 42* 75*  --   --  454*  --   --      Estimated Creatinine Clearance: 57.7 mL/min (A) (by C-G formula based on SCr of 1.68 mg/dL (H)).  Assessment: 71 y.o. male with h/o Afib, Eliquis on hold, for heparin  Goal of Therapy:  aPTT 66-102 sec Heparin level 0.3-0.7 units/ml Monitor platelets by anticoagulation protocol: Yes   Plan:  Increase Heparin 1750 units/hr Follow-up am labs.  Phillis Knack, PharmD, BCPS  01/22/2022,11:40 PM

## 2022-01-22 NOTE — ED Notes (Signed)
MD notified. for drop in Oxygen to 81% on RA with various Probe placement.

## 2022-01-22 NOTE — Progress Notes (Signed)
ANTICOAGULATION CONSULT NOTE - Initial Consult  Pharmacy Consult for Heparin Indication: atrial fibrillation  Allergies  Allergen Reactions   Shellfish Allergy Anaphylaxis, Hives and Swelling    Tongue swelling    Sulfa Antibiotics Anaphylaxis, Hives and Swelling    Tongue swelling   Ace Inhibitors Other (See Comments) and Cough    CKD, renal failure    Invokana [Canagliflozin] Other (See Comments)    Syncope Dehydration    Lexapro [Escitalopram] Other (See Comments)    Buzzing in ears Headaches "Felt like a zombie"   Metformin And Related Itching   Pravachol [Pravastatin Sodium] Other (See Comments)    Myalgias    Repatha [Evolocumab] Other (See Comments)    Myalgias Flu like symptoms    Tricor [Fenofibrate] Other (See Comments)    Myalgias     Zestril [Lisinopril] Cough   Crestor [Rosuvastatin] Other (See Comments)    Myalgias    Horse-Derived Products Rash    horse serum   Lipitor [Atorvastatin] Other (See Comments)    Myalgias    Livalo [Pitavastatin] Other (See Comments)    Myalgias    Milk (Cow) Diarrhea and Nausea Only    Stomach upset    Tape Rash    Patient Measurements: Height: '6\' 2"'$  (188 cm) Weight: 122 kg (268 lb 15.4 oz) IBW/kg (Calculated) : 82.2 Heparin Dosing Weight: 109 kg  Vital Signs: Temp: 98.6 F (37 C) (09/07 0752) Temp Source: Oral (09/07 0752) BP: 148/93 (09/07 1045) Pulse Rate: 91 (09/07 1045)  Labs: Recent Labs    01/20/22 0129 01/20/22 0433 01/21/22 1911 01/22/22 0450  HGB 11.1*  --  11.5* 10.4*  HCT 37.8*  --  38.2* 36.3*  PLT 301  --  348 303  APTT 49*  --  50*  --   LABPROT 16.6*  --  22.3* 24.9*  INR 1.4*  --  2.0* 2.3*  CREATININE 1.57*  --  1.91* 1.81*  TROPONINIHS 42* 75*  --   --     Estimated Creatinine Clearance: 51.9 mL/min (A) (by C-G formula based on SCr of 1.81 mg/dL (H)).   Medical History: Past Medical History:  Diagnosis Date   Anxiety    Arthritis    Atrial fibrillation (HCC)    CAD  (coronary artery disease)    a. 2010: DES to CTO of RCA. EF 55% b. 07/2016: cath showing total occlusion within previously placed RCA stent (collaterals present), severe stenosis along LCx and OM1 (treated with 2 overlapping DES). c. repeat cath in 01/2018 showing patent stents along LCx and OM with CTO of D2, CTO of distal LCx, and CTO of RCA with collaterals present overall unchanged since 2018 with medical management recom   Cellulitis and abscess rt groin    Complication of anesthesia    " I woke up during a colonoscopy "      Depression    Diabetes mellitus    Diastolic CHF ([facility])    Disorders of iron metabolism    Dysrhythmia    Fibromyalgia    GERD (gastroesophageal reflux disease)    History of hiatal hernia    Hyperlipidemia    Hypertension    Low serum testosterone level    Medically noncompliant    Myocardial infarction [facility])    05-23-20   Pneumonia     Medications:  See electronic med rec  Assessment: 71 y.o. M known to pharmacy from abx dosing for sepsis. Pt on Eliquis PTA for afib. Last dose 9/6 a.m. Holding  currently in case procedures are needed. To start heparin gtt. INR 2.3 (liver?), Hgb down to 10.4, plt wnl. aPTT 50 sec at baseline. Apixaban will likely be affecting heparin level so will utilize aPTT for monitoring until levels correlate.  Goal of Therapy:  Heparin level 0.3-0.7 units/ml, aPTT 66-102 sec Monitor platelets by anticoagulation protocol: Yes   Plan:  Heparin gtt at 1600 units/hr. No bolus with recent apixaban. Will f/u heparin level and aPTT in 8 hours Daily heparin level, aPTT, and CBC  Sherlon Handing, PharmD, BCPS Please see amion for complete clinical pharmacist phone list 01/22/2022,11:06 AM

## 2022-01-22 NOTE — ED Notes (Signed)
Hall DO made aware of pts lactic 3.3. No new orders

## 2022-01-22 NOTE — ED Notes (Signed)
Pt in room A&O x2 with wife at bedside. Pt is diaphoretic in room given cold cloth and Ice pack. Condom cath placed. Updated on plan of care. VS unstable.

## 2022-01-22 NOTE — Progress Notes (Signed)
PROGRESS NOTE  [name] [name] IDP:[id number] DOB: 23-Sep-1950   PCP: [name], FNP  Patient is from: Home.  Lives with his wife.  Uses cane, walker and electric chair at baseline.  DOA: 01/21/2022 LOS: 1  Chief complaints Chief Complaint  Patient presents with   Weakness     Brief Narrative / Interim history: 71 year old M with PMH of recurrent Streptococcus bacteremia, PAD s/p multiple vascular interventions, right foot toe gangrene, paroxysmal A-fib on Eliquis, CAD, ICM/diastolic CHF, IDDM-2, morbid obesity, depression, fibromyalgia, HTN, HLD and GERD presenting with fever, chills, abdominal pain, nausea and dry heaving, and admitted with working diagnosis of possible sepsis without clear source.   Initially went to [place] [place] ED on 9/5 after he called EMS for shortness of breath although his SOB resolved when EMS arrived at his house.  There is low suspicion for infection but slightly elevated troponin which was felt to be chronic for him.  Blood and urine cultures obtained.  He was discharged from ED. He had a video encounter with infectious disease on 9/6 as a follow-up for his recurrent strep bacteremia.  He was advised to come to ED in case he has fevers, persistent nausea and vomiting.   In ED, he had SIRS with WBC, tachycardia and tachypnea but not hypotensive.  Mild temp to 99.6. Cr 1.91 (baseline 1.4-1.6).  BUN 28.  AST 243.  ALT 139.  Total bili 1.6.  Lactic acid 3.0.  WBC 21 with left shift.  Hgb 11.5.  INR 2.0.  PT 22.  APTT 50.  EKG rate controlled A-fib, RBBB and nonspecific ST-T wave changes.  CXR cardiomegaly with mild central congestion.  CT A/P raises concern for gallbladder stones/sludge, questionable pericholecystic fat stranding, hepatomegaly, hepatic steatosis, small volume ascites and trace right pleural effusion.  RUQ [place] with cholelithiasis and mild gallbladder wall thickening without evidence of acute cholecystitis.  COVID-19 and influenza PCR nonreactive.  UA  without significant finding.  Received total of 2 L boluses.  Cultures and HIDA scan ordered.  Started on broad-spectrum antibiotics.  Admitted with working diagnosis of sepsis without clear source.   Subjective: Seen and examined earlier this morning.  No major events overnight of this morning.  Feels his body is "on fire".  Reports shortness of breath for about 3 days.  No further nausea or dry heaving.  He denies abdominal pain.  He denies UTI symptoms but patient's wife reports increased urinary incontinence.  He denies chest pain and cough.  He has some headache and pain in his neck unchanged from baseline.  Objective: Vitals:   01/22/22 0930 01/22/22 0934 01/22/22 1000 01/22/22 1045  BP: (!) 138/95 (!) 138/95 136/73 (!) 148/93  Pulse: (!) 102 96 89 91  Resp: (!) 26  (!) 23 18  Temp:      TempSrc:      SpO2: 90%  92% 99%  Weight:      Height:        Examination:  GENERAL: No apparent distress.  Nontoxic. HEENT: MMM.  Vision and hearing grossly intact.  NECK: Supple.  No apparent JVD.  RESP:  No IWOB.  Fair aeration bilaterally. CVS: Irregular rhythm.  Normal rate.  Heart sounds normal.  ABD/GI/GU: BS+. Abd soft.  RUQ tenderness but inconsistent.  Rebound tenderness but inconsistent.  No Murphy signs. MSK/EXT:  Moves extremities.  Gangrenous changes in his right foot toes.  Scabbed clean looking wounds on RLE.  Extremities cold to touch.  Faint DP/PT pulses.  SKIN: no apparent skin lesion or wound NEURO: Awake and oriented x4.  No apparent focal neuro deficit. PSYCH: Calm. Normal affect.   Procedures:  None  Microbiology summarized: 9/5-urine culture with 3000 colonies of Enterococcus faecalis at [facility] 9/5-blood cultures NGTD at [facility] 9/6-blood cultures NGTD 9/6-COVID-19 and influenza PCR nonreactive  Assessment and plan: Principal Problem:   SIRS (systemic inflammatory response syndrome) ([place]) Active Problems:   Hyperlipidemia associated  with type 2 diabetes mellitus (HCC)   CAD S/P percutaneous coronary angioplasty   GERD (gastroesophageal reflux disease)   AKI (acute kidney injury) ([place])   Diabetes ([place])   Class II obesity   Anxiety   CHF (congestive heart failure) (HCC)   PAF (paroxysmal atrial fibrillation) (HCC)   PAOD (peripheral arterial occlusive disease) ([place])   Coronary artery disease of native artery of native heart with stable angina pectoris (HCC)   Dry gangrene (HCC)   Elevated LFTs   Nausea and vomiting   Choledocholithiasis   Goals of care, counseling/discussion  SIRS: POA.  Had leukocytosis, tachycardia and tachypnea.  Also lactic acidosis.  He is not hypotensive.  Unclear source of infection.  He has history of recurrent Streptococcus bacteremia.  CT the abdomen and RUQ [place] with cholelithiasis and gallbladder wall thickening.  Had nausea, vomiting and RUQ tenderness but negative Murphy sign.  Also rebound tenderness but not consistently.  He has bilateral PAD with right foot dry gangrene which seems to have improved tremendously compared to the pictures from May and June.  Blood culture on 9/5 and 90/6 NGTD.  Urine culture on 9/5 with 3000 colonies of Enterococcus faecalis but no significant urinary symptoms.  -Follow cultures -Follow HIDA scan to exclude acute cholecystitis. -Continue broad-spectrum antibiotics -IV NS bolus 1 L at 250 cc an hour -Recheck lactic acid after IV fluid bolus.  Shortness of breath: Unclear etiology of this.  CXR suggests cardiomegaly and mild vascular congestion but he looks euvolemic, may be dry on exam.  His oxygen saturation fluctuates mainly due to poor circulation.  Lung exam reassuring.  Low suspicion for pneumonia.  Low suspicion for PE while on Eliquis.  He might have undiagnosed sleep apnea. -Wean oxygen as able -Check BNP  Nausea/vomiting/cholelithiasis/possible cholecystitis: Nausea and vomiting seems to have resolved.  Imaging shows cholelithiasis, gallbladder wall  thickening and small ascites.  He has inconsistent RUQ and rebound tenderness but negative Murphy sign's.  He has elevated AST and ALT with pattern consistent with alcohol or rhabdo.  He denies heavy drinking. -Continue broad-spectrum antibiotics-vancomycin, cefepime and Flagyl -IV fluid as above -Check CK -Follow HIDA scan -As needed antiemetics -Hold Plavix and aspirin pending HIDA results.  Elevated LFT-uptrending.  ALP within arguing against obstructive etiology.  Total bili 1.6. -Recheck LFT this afternoon. -Acute hepatitis panel and HIV -The rest as above.  PAD with dry gangrene in right foot toes: Surprisingly improved tremendously compared to his pictures from May and June.  Seen by vascular surgery on 7/17.  He was not interested in amputation.  At that time, the plan was follow-up in 3 months. -Holding Plavix and aspirin pending HIDA results -Has statin intolerance. -IV heparin per pharmacy -Consult wound care  AKI on CKD-3A: Likely prerenal.  Also Lasix at home.  Improving. Recent Labs    08/19/21 0332 11/13/21 0912 12/17/21 1343 12/18/21 0305 12/19/21 0403 12/23/21 0850 12/26/21 1615 01/20/22 0129 01/21/22 1911 01/22/22 0450  BUN 14 19 26* 26* 29* 19 20 24* 28* 30*  CREATININE  1.30* 1.59* 1.62* 1.73* 1.58* 1.38* 1.56* 1.57* 1.91* 1.81*  -IV fluid as above -Continue monitoring  History of CAD: LHC in 07/2021.  At that time the plan was medical management.  Denies chest pain.  Had a mild elevated troponin when he was seen at [facility] on 9/5 likely demand ischemia and delayed clearance from AKI. -Continue home medication except Plavix.  Paroxysmal A-fib: He is in A-fib but rate controlled. -Continue Toprol-XL -IV heparin instead of Eliquis  Diastolic CHF/ICM: Appears euvolemic on exam.  CXR with cardiomegaly and mild vascular congestion. -Monitor respiratory and fluid status -Hold home Lasix.  Uncontrolled IDDM-2 with hyperglycemia, CKD-3A and PAD: A1c 7.8%  on 8/11. Recent Labs  Lab 01/22/22 0750 01/22/22 1121  GLUCAP 248* 179*  -Continue current insulin regimen -Has statin intolerance.  And ID/depression/fibromyalgia: Stable. -Continue home medications.  GERD -Continue PPI  Lactic acidosis: Could be septic or ischemic from PAD -IV fluid as above. -Continue monitoring  Goal of care counseling: After extensive discussion about CODE STATUS and pros and cons of CPR intubation with patient and patient's wife, they prefer to stay full code.  Class II obesity Body mass index is 34.53 kg/m. -Already on Mounjaro.  Pressure skin injury: Stable.  Low suspicion for infection here. Pressure Injury 12/17/21 Buttocks Right;Left;Lower Stage 2 -  Partial thickness loss of dermis presenting as a shallow open injury with a red, pink wound bed without slough. small skin breakage on buttocks, seems to be pressure related based on location (Active)  12/17/21 2200  Location: Buttocks  Location Orientation: Right;Left;Lower  Staging: Stage 2 -  Partial thickness loss of dermis presenting as a shallow open injury with a red, pink wound bed without slough.  Wound Description (Comments): small skin breakage on buttocks, seems to be pressure related based on location  Present on Admission: Yes  -Consult wound care  DVT prophylaxis:  On full dose anticoagulation for A-fib.  Code Status: Full code Family Communication: Updated patient's wife at bedside. Level of care: Progressive Status is: Inpatient Remains inpatient appropriate because: SIRS, elevated LFT   Final disposition: TBD Consultants:  None  Sch Meds:  Scheduled Meds:  DULoxetine  90 mg Oral Daily   ezetimibe  10 mg Oral Daily   gabapentin  300 mg Oral TID   insulin aspart  0-20 Units Subcutaneous TID WC   insulin aspart  0-5 Units Subcutaneous QHS   isosorbide mononitrate  30 mg Oral Daily   metoprolol succinate  50 mg Oral Daily   pantoprazole  40 mg Oral Daily   Continuous  Infusions:  ceFEPime (MAXIPIME) IV Stopped (01/22/22 1045)   heparin     lactated ringers 50 mL/hr at 01/22/22 0452   metronidazole 500 mg (01/22/22 1042)   vancomycin     PRN Meds:.acetaminophen **OR** acetaminophen, busPIRone, nitroGLYCERIN, ondansetron **OR** ondansetron (ZOFRAN) IV  Antimicrobials: Anti-infectives (From admission, onward)    Start     Dose/Rate Route Frequency Ordered Stop   01/22/22 2300  vancomycin (VANCOREADY) IVPB 1250 mg/250 mL  Status:  Discontinued        1,250 mg 166.7 mL/hr over 90 Minutes Intravenous Every 24 hours 01/21/22 2253 01/21/22 2305   01/22/22 2300  vancomycin (VANCOREADY) IVPB 1250 mg/250 mL        1,250 mg 166.7 mL/hr over 90 Minutes Intravenous Every 24 hours 01/21/22 2305 01/29/22 2259   01/22/22 1000  ceFEPIme (MAXIPIME) 2 g in sodium chloride 0.9 % 100 mL IVPB  Status:  Discontinued  2 g 200 mL/hr over 30 Minutes Intravenous Every 12 hours 01/21/22 2253 01/21/22 2305   01/22/22 1000  ceFEPIme (MAXIPIME) 2 g in sodium chloride 0.9 % 100 mL IVPB        2 g 200 mL/hr over 30 Minutes Intravenous Every 12 hours 01/21/22 2305 01/29/22 0959   01/22/22 0445  ceFEPIme (MAXIPIME) 2 g in sodium chloride 0.9 % 100 mL IVPB  Status:  Discontinued        2 g 200 mL/hr over 30 Minutes Intravenous  Once 01/22/22 0438 01/22/22 0444   01/22/22 0445  vancomycin (VANCOCIN) IVPB 1000 mg/200 mL premix  Status:  Discontinued        1,000 mg 200 mL/hr over 60 Minutes Intravenous  Once 01/22/22 0438 01/22/22 0445   01/22/22 0438  metroNIDAZOLE (FLAGYL) IVPB 500 mg        500 mg 100 mL/hr over 60 Minutes Intravenous Every 12 hours 01/22/22 0438 01/28/22 2159   01/21/22 2300  vancomycin (VANCOREADY) IVPB 2000 mg/400 mL        2,000 mg 200 mL/hr over 120 Minutes Intravenous  Once 01/21/22 2303 01/22/22 0220   01/21/22 2100  vancomycin (VANCOREADY) IVPB 2000 mg/400 mL  Status:  Discontinued        2,000 mg 200 mL/hr over 120 Minutes Intravenous  Once  01/21/22 2054 01/21/22 2303   01/21/22 2045  ceFEPIme (MAXIPIME) 2 g in sodium chloride 0.9 % 100 mL IVPB        2 g 200 mL/hr over 30 Minutes Intravenous  Once 01/21/22 2040 01/21/22 2234   01/21/22 2045  metroNIDAZOLE (FLAGYL) IVPB 500 mg        500 mg 100 mL/hr over 60 Minutes Intravenous  Once 01/21/22 2040 01/22/22 0032   01/21/22 2045  vancomycin (VANCOCIN) IVPB 1000 mg/200 mL premix  Status:  Discontinued        1,000 mg 200 mL/hr over 60 Minutes Intravenous  Once 01/21/22 2040 01/21/22 2054        I have personally reviewed the following labs and images: CBC: Recent Labs  Lab 01/20/22 0129 01/21/22 1911 01/22/22 0450  WBC 10.4 20.9* 19.0*  NEUTROABS 8.5* 18.1*  --   HGB 11.1* 11.5* 10.4*  HCT 37.8* 38.2* 36.3*  MCV 78.8* 77.6* 80.5  PLT 301 348 303   BMP &GFR Recent Labs  Lab 01/20/22 0129 01/21/22 1911 01/22/22 0450  NA 136 132* 133*  K 4.3 4.1 4.3  CL 97* 95* 98  CO2 28 24 21*  GLUCOSE 249* 184* 173*  BUN 24* 28* 30*  CREATININE 1.57* 1.91* 1.81*  CALCIUM 9.4 9.5 8.9   Estimated Creatinine Clearance: 51.9 mL/min (A) (by C-G formula based on SCr of 1.81 mg/dL (H)). Liver & Pancreas: Recent Labs  Lab 01/20/22 0129 01/21/22 1911 01/22/22 0450  AST 20 243* 522*  ALT 15 139* 318*  ALKPHOS 48 50 48  BILITOT 1.1 1.6* 2.3*  PROT 8.1 8.7* 7.3  ALBUMIN 4.0 4.1 3.4*   Recent Labs  Lab 01/21/22 2130  LIPASE 21   No results for input(s): "AMMONIA" in the last 168 hours. Diabetic: No results for input(s): "HGBA1C" in the last 72 hours. Recent Labs  Lab 01/22/22 0750 01/22/22 1121  GLUCAP 248* 179*   Cardiac Enzymes: No results for input(s): "CKTOTAL", "CKMB", "CKMBINDEX", "TROPONINI" in the last 168 hours. No results for input(s): "PROBNP" in the last 8760 hours. Coagulation Profile: Recent Labs  Lab 01/20/22 0129 01/21/22 1911 01/22/22 0450  INR 1.4* 2.0* 2.3*   Thyroid Function Tests: No results for input(s): "TSH", "T4TOTAL",  "FREET4", "T3FREE", "THYROIDAB" in the last 72 hours. Lipid Profile: No results for input(s): "CHOL", "HDL", "LDLCALC", "TRIG", "CHOLHDL", "LDLDIRECT" in the last 72 hours. Anemia Panel: No results for input(s): "VITAMINB12", "FOLATE", "FERRITIN", "TIBC", "IRON", "RETICCTPCT" in the last 72 hours. Urine analysis:    Component Value Date/Time   COLORURINE YELLOW 01/22/2022 0042   APPEARANCEUR CLEAR 01/22/2022 0042   APPEARANCEUR Clear 10/23/2021 1224   LABSPEC 1.030 01/22/2022 0042   PHURINE 6.0 01/22/2022 0042   GLUCOSEU >=500 (A) 01/22/2022 0042   HGBUR SMALL (A) 01/22/2022 0042   BILIRUBINUR NEGATIVE 01/22/2022 0042   BILIRUBINUR Negative 10/23/2021 1224   KETONESUR 5 (A) 01/22/2022 0042   PROTEINUR 100 (A) 01/22/2022 0042   UROBILINOGEN negative 08/13/2014 1725   UROBILINOGEN 0.2 08/07/2013 1747   NITRITE NEGATIVE 01/22/2022 0042   LEUKOCYTESUR NEGATIVE 01/22/2022 0042   Sepsis Labs: Invalid input(s): "PROCALCITONIN", "LACTICIDVEN"  Microbiology: Recent Results (from the past 240 hour(s))  Blood Culture (routine x 2)     Status: None (Preliminary result)   Collection Time: 01/20/22  1:29 AM   Specimen: BLOOD  Result Value Ref Range Status   Specimen Description BLOOD RAC  Final   Special Requests   Final    BOTTLES DRAWN AEROBIC AND ANAEROBIC Blood Culture adequate volume   Culture   Final    NO GROWTH 2 DAYS Performed at [facility], [address], [place], [place] [address]    Report Status PENDING  Incomplete  Blood Culture (routine x 2)     Status: None (Preliminary result)   Collection Time: 01/20/22  1:29 AM   Specimen: BLOOD  Result Value Ref Range Status   Specimen Description BLOOD RFOA  Final   Special Requests   Final    BOTTLES DRAWN AEROBIC AND ANAEROBIC Blood Culture adequate volume   Culture   Final    NO GROWTH 2 DAYS Performed at [facility], [address] [place], [place] [address]    Report Status PENDING  Incomplete  SARS Coronavirus 2 by  RT PCR (hospital order, performed in [place] hospital lab) *cepheid single result test* Urine, Clean Catch     Status: None   Collection Time: 01/20/22  3:53 AM   Specimen: Urine, Clean Catch; Nasal Swab  Result Value Ref Range Status   SARS Coronavirus 2 by RT PCR NEGATIVE NEGATIVE Final    Comment: (NOTE) SARS-CoV-2 target nucleic acids are NOT DETECTED.  The SARS-CoV-2 RNA is generally detectable in upper and lower respiratory specimens during the acute phase of infection. The lowest concentration of SARS-CoV-2 viral copies this assay can detect is 250 copies / mL. A negative result does not preclude SARS-CoV-2 infection and should not be used as the sole basis for treatment or other patient management decisions.  A negative result may occur with improper specimen collection / handling, submission of specimen other than nasopharyngeal swab, presence of viral mutation(s) within the areas targeted by this assay, and inadequate number of viral copies (<250 copies / mL). A negative result must be combined with clinical observations, patient history, and epidemiological information.  Fact Sheet for Patients:   https://www.patel.info/  Fact Sheet for Healthcare Providers: https://hall.com/  This test is not yet approved or  cleared by the [place] FDA and has been authorized for detection and/or diagnosis of SARS-CoV-2 by FDA under an Emergency Use Authorization (EUA).  This EUA will remain in  effect (meaning this test can be used) for the duration of the COVID-19 declaration under Section 564(b)(1) of the Act, 21 U.S.C. section 360bbb-3(b)(1), unless the authorization is terminated or revoked sooner.  Performed at [facility], [address] [place], [place] [address]   Urine Culture     Status: Abnormal   Collection Time: 01/20/22  3:57 AM   Specimen: In/Out Cath Urine  Result Value Ref Range Status   Specimen Description    Final    IN/OUT CATH URINE Performed at [facility], [address] [place], [place] [address]    Special Requests   Final    NONE Performed at [facility], [address] [place], [place] [address]    Culture 3,000 COLONIES/mL ENTEROCOCCUS FAECALIS (A)  Final   Report Status 01/22/2022 FINAL  Final   Organism ID, Bacteria ENTEROCOCCUS FAECALIS (A)  Final      Susceptibility   Enterococcus faecalis - MIC*    AMPICILLIN <=2 SENSITIVE Sensitive     NITROFURANTOIN <=16 SENSITIVE Sensitive     VANCOMYCIN 2 SENSITIVE Sensitive     * 3,000 COLONIES/mL ENTEROCOCCUS FAECALIS  Blood culture (routine x 2)     Status: None (Preliminary result)   Collection Time: 01/21/22  9:30 PM   Specimen: BLOOD  Result Value Ref Range Status   Specimen Description BLOOD BLOOD RIGHT ARM  Final   Special Requests   Final    BOTTLES DRAWN AEROBIC AND ANAEROBIC Blood Culture adequate volume   Culture   Final    NO GROWTH < 12 HOURS Performed at [facility], [place] [address], [place],  [address]    Report Status PENDING  Incomplete  Resp Panel by RT-PCR (Flu A&B, Covid) Anterior Nasal Swab     Status: None   Collection Time: 01/21/22 11:04 PM   Specimen: Anterior Nasal Swab  Result Value Ref Range Status   SARS Coronavirus 2 by RT PCR NEGATIVE NEGATIVE Final    Comment: (NOTE) SARS-CoV-2 target nucleic acids are NOT DETECTED.  The SARS-CoV-2 RNA is generally detectable in upper respiratory specimens during the acute phase of infection. The lowest concentration of SARS-CoV-2 viral copies this assay can detect is 138 copies/mL. A negative result does not preclude SARS-Cov-2 infection and should not be used as the sole basis for treatment or other patient management decisions. A negative result may occur with  improper specimen collection/handling, submission of specimen other than nasopharyngeal swab, presence of viral mutation(s) within the areas targeted by this assay, and inadequate  number of viral copies(<138 copies/mL). A negative result must be combined with clinical observations, patient history, and epidemiological information. The expected result is Negative.  Fact Sheet for Patients:  EntrepreneurPulse.com.au  Fact Sheet for Healthcare Providers:  IncredibleEmployment.be  This test is no t yet approved or cleared by the [place] FDA and  has been authorized for detection and/or diagnosis of SARS-CoV-2 by FDA under an Emergency Use Authorization (EUA). This EUA will remain  in effect (meaning this test can be used) for the duration of the COVID-19 declaration under Section 564(b)(1) of the Act, 21 U.S.C.section 360bbb-3(b)(1), unless the authorization is terminated  or revoked sooner.       Influenza A by PCR NEGATIVE NEGATIVE Final   Influenza B by PCR NEGATIVE NEGATIVE Final    Comment: (NOTE) The Xpert Xpress SARS-CoV-2/FLU/RSV plus assay is intended as an aid in the diagnosis of influenza from Nasopharyngeal swab specimens and should not be used as  a sole basis for treatment. Nasal washings and aspirates are unacceptable for Xpert Xpress SARS-CoV-2/FLU/RSV testing.  Fact Sheet for Patients: EntrepreneurPulse.com.au  Fact Sheet for Healthcare Providers: IncredibleEmployment.be  This test is not yet approved or cleared by the [place] FDA and has been authorized for detection and/or diagnosis of SARS-CoV-2 by FDA under an Emergency Use Authorization (EUA). This EUA will remain in effect (meaning this test can be used) for the duration of the COVID-19 declaration under Section 564(b)(1) of the Act, 21 U.S.C. section 360bbb-3(b)(1), unless the authorization is terminated or revoked.  Performed at [facility], [place] [address] [place], [place] [address]     Radiology Studies: US Abdomen Limited RUQ (LIVER/GB)  Result Date: 01/21/2022 CLINICAL DATA:   Abdominal pain. EXAM: ULTRASOUND ABDOMEN LIMITED RIGHT UPPER QUADRANT COMPARISON:  None Available. FINDINGS: Gallbladder: Numerous ill-defined shadowing echogenic gallstones are seen within the gallbladder lumen. The gallbladder wall measures 4.01 mm in thickness. No sonographic Murphy sign noted by sonographer. Common bile duct: Diameter: 6.5 mm Liver: No focal lesion identified. Within normal limits in parenchymal echogenicity. Portal vein is patent on color Doppler imaging with normal direction of blood flow towards the liver. Other: None. IMPRESSION: Cholelithiasis and mild gallbladder wall thickening, without evidence of acute cholecystitis. Electronically Signed   By: Virgina Norfolk M.D.   On: 01/21/2022 22:51   CT ABDOMEN PELVIS W CONTRAST  Result Date: 01/21/2022 CLINICAL DATA:  Acute abdominal pain. EXAM: CT ABDOMEN AND PELVIS WITH CONTRAST TECHNIQUE: Multidetector CT imaging of the abdomen and pelvis was performed using the standard protocol following bolus administration of intravenous contrast. RADIATION DOSE REDUCTION: This exam was performed according to the departmental dose-optimization program which includes automated exposure control, adjustment of the mA and/or kV according to patient size and/or use of iterative reconstruction technique. CONTRAST:  65m OMNIPAQUE IOHEXOL 300 MG/ML  SOLN COMPARISON:  CT 12/17/2021 FINDINGS: Lower chest: Trace right pleural effusion is new from prior exam. Breathing motion artifact limits detailed assessment. Heart is upper normal in size. There are coronary artery calcifications. Hepatobiliary: The liver is enlarged spanning 22.9 cm cranial caudal with diffuse steatosis. No evidence of focal liver lesion. There is layering hyperdensity in the gallbladder which may represent stones/sludge. Question of mild pericholecystic fat stranding, not well assessed due to motion. No biliary dilatation. Small volume of perihepatic ascites. Pancreas: Fatty atrophy.  No  ductal dilatation or inflammation. Spleen: Normal in size. No obvious focal abnormality, motion limited evaluation. Adrenals/Urinary Tract: Normal adrenal glands. No hydronephrosis. No renal calculi. Mild symmetric perinephric edema, also seen on prior no suspicious renal lesion. Unremarkable urinary bladder. Stomach/Bowel: Motion artifact through the upper abdomen limits bowel assessment. No inflammation of the stomach or duodenum. There is no small bowel obstruction or small bowel inflammation. Normal appendix visualized. Small volume of colonic stool. Sigmoid colon is redundant. Vascular/Lymphatic: Aortic atherosclerosis. Calcified noncalcified atheromatous plaque. No aneurysm. Prominent periportal node measuring 12 mm, likely reactive. No suspicious adenopathy. Reproductive: Prostate is unremarkable. Other: Small amount of perihepatic ascites. Minimal free fluid in the right pericolic gutter which tracks into the pelvis. No free air. Fat within both inguinal canals, right greater than left. Musculoskeletal: Stable degenerative change in the spine. There are no acute or suspicious osseous abnormalities. IMPRESSION: 1. Layering hyperdensity in the gallbladder may represent stones/sludge. Question of mild pericholecystic fat stranding, not well assessed due to motion artifact. Right upper quadrant ultrasound is planned. 2. Hepatomegaly and hepatic steatosis. Small volume of perihepatic and right pericolic  gutter ascites. 3. Trace right pleural effusion, new from prior exam. Aortic Atherosclerosis (ICD10-I70.0). Electronically Signed   By: Keith Rake M.D.   On: 01/21/2022 22:27   DG Chest 2 View  Result Date: 01/21/2022 CLINICAL DATA:  Crackles in chest EXAM: CHEST - 2 VIEW COMPARISON:  01/20/2022, 12/17/2021 FINDINGS: Electronic device over left chest. Cardiomegaly with mild central congestion. No focal airspace disease, pleural effusion, or pneumothorax. Aortic atherosclerosis. IMPRESSION: Cardiomegaly  with mild central congestion Electronically Signed   By: Donavan Foil M.D.   On: 01/21/2022 20:38      [facility]  If 7PM-7AM, please contact night-coverage [email or web address] 01/22/2022, 11:36 AM

## 2022-01-23 DIAGNOSIS — E1169 Type 2 diabetes mellitus with other specified complication: Secondary | ICD-10-CM

## 2022-01-23 DIAGNOSIS — R6510 Systemic inflammatory response syndrome (SIRS) of non-infectious origin without acute organ dysfunction: Secondary | ICD-10-CM | POA: Diagnosis not present

## 2022-01-23 DIAGNOSIS — F419 Anxiety disorder, unspecified: Secondary | ICD-10-CM | POA: Diagnosis not present

## 2022-01-23 DIAGNOSIS — K8020 Calculus of gallbladder without cholecystitis without obstruction: Secondary | ICD-10-CM | POA: Diagnosis not present

## 2022-01-23 DIAGNOSIS — I5021 Acute systolic (congestive) heart failure: Secondary | ICD-10-CM

## 2022-01-23 DIAGNOSIS — I5023 Acute on chronic systolic (congestive) heart failure: Secondary | ICD-10-CM | POA: Diagnosis not present

## 2022-01-23 DIAGNOSIS — I2510 Atherosclerotic heart disease of native coronary artery without angina pectoris: Secondary | ICD-10-CM | POA: Diagnosis not present

## 2022-01-23 DIAGNOSIS — E785 Hyperlipidemia, unspecified: Secondary | ICD-10-CM

## 2022-01-23 DIAGNOSIS — N179 Acute kidney failure, unspecified: Secondary | ICD-10-CM | POA: Diagnosis not present

## 2022-01-23 DIAGNOSIS — R7989 Other specified abnormal findings of blood chemistry: Secondary | ICD-10-CM | POA: Diagnosis not present

## 2022-01-23 LAB — LIPID PANEL
Cholesterol: 127 mg/dL (ref 0–200)
HDL: 11 mg/dL — ABNORMAL LOW (ref >40)
LDL Cholesterol: 76 mg/dL (ref 0–99)
Total CHOL/HDL Ratio: 11.5 RATIO
Triglycerides: 199 mg/dL — ABNORMAL HIGH (ref <150)
VLDL: 40 mg/dL (ref 0–40)

## 2022-01-23 LAB — COMPREHENSIVE METABOLIC PANEL
ALT: 670 U/L — ABNORMAL HIGH (ref 0–44)
AST: 647 U/L — ABNORMAL HIGH (ref 15–41)
Albumin: 3.2 g/dL — ABNORMAL LOW (ref 3.5–5.0)
Alkaline Phosphatase: 47 U/L (ref 38–126)
Anion gap: 13 (ref 5–15)
BUN: 29 mg/dL — ABNORMAL HIGH (ref 8–23)
CO2: 22 mmol/L (ref 22–32)
Calcium: 9 mg/dL (ref 8.9–10.3)
Chloride: 100 mmol/L (ref 98–111)
Creatinine, Ser: 1.45 mg/dL — ABNORMAL HIGH (ref 0.61–1.24)
GFR, Estimated: 52 mL/min — ABNORMAL LOW (ref >60)
Glucose, Bld: 132 mg/dL — ABNORMAL HIGH (ref 70–99)
Potassium: 3.9 mmol/L (ref 3.5–5.1)
Sodium: 135 mmol/L (ref 135–145)
Total Bilirubin: 2.2 mg/dL — ABNORMAL HIGH (ref 0.3–1.2)
Total Protein: 7.2 g/dL (ref 6.5–8.1)

## 2022-01-23 LAB — GLUCOSE, CAPILLARY
Glucose-Capillary: 148 mg/dL — ABNORMAL HIGH (ref 70–99)
Glucose-Capillary: 158 mg/dL — ABNORMAL HIGH (ref 70–99)
Glucose-Capillary: 154 mg/dL — ABNORMAL HIGH (ref 70–99)
Glucose-Capillary: 239 mg/dL — ABNORMAL HIGH (ref 70–99)

## 2022-01-23 LAB — HEPARIN LEVEL (UNFRACTIONATED): Heparin Unfractionated: >1.1 IU/mL — ABNORMAL HIGH (ref 0.30–0.70)

## 2022-01-23 LAB — CK: Total CK: 174 U/L (ref 49–397)

## 2022-01-23 LAB — APTT
aPTT: 60 seconds — ABNORMAL HIGH (ref 24–36)
aPTT: 78 seconds — ABNORMAL HIGH (ref 24–36)

## 2022-01-23 LAB — CBC WITH DIFFERENTIAL/PLATELET
Abs Immature Granulocytes: 0.11 10*3/uL — ABNORMAL HIGH (ref 0.00–0.07)
Basophils Absolute: 0.1 10*3/uL (ref 0.0–0.1)
Basophils Relative: 1 %
Eosinophils Absolute: 0.1 10*3/uL (ref 0.0–0.5)
Eosinophils Relative: 1 %
HCT: 33.3 % — ABNORMAL LOW (ref 39.0–52.0)
Hemoglobin: 9.8 g/dL — ABNORMAL LOW (ref 13.0–17.0)
Immature Granulocytes: 1 %
Lymphocytes Relative: 9 %
Lymphs Abs: 1.1 10*3/uL (ref 0.7–4.0)
MCH: 23.6 pg — ABNORMAL LOW (ref 26.0–34.0)
MCHC: 29.4 g/dL — ABNORMAL LOW (ref 30.0–36.0)
MCV: 80.2 fL (ref 80.0–100.0)
Monocytes Absolute: 0.9 10*3/uL (ref 0.1–1.0)
Monocytes Relative: 7 %
Neutro Abs: 10.1 10*3/uL — ABNORMAL HIGH (ref 1.7–7.7)
Neutrophils Relative %: 81 %
Platelets: 273 10*3/uL (ref 150–400)
RBC: 4.15 MIL/uL — ABNORMAL LOW (ref 4.22–5.81)
RDW: 16.4 % — ABNORMAL HIGH (ref 11.5–15.5)
WBC: 12.4 10*3/uL — ABNORMAL HIGH (ref 4.0–10.5)
nRBC: 0 % (ref 0.0–0.2)

## 2022-01-23 LAB — TROPONIN I (HIGH SENSITIVITY): Troponin I (High Sensitivity): 157 ng/L (ref <18)

## 2022-01-23 LAB — PHOSPHORUS: Phosphorus: 2.3 mg/dL — ABNORMAL LOW (ref 2.5–4.6)

## 2022-01-23 LAB — MAGNESIUM: Magnesium: 2.2 mg/dL (ref 1.7–2.4)

## 2022-01-23 LAB — LIPASE, BLOOD: Lipase: 28 U/L (ref 11–51)

## 2022-01-23 MED ORDER — ONDANSETRON HCL 4 MG/2ML IJ SOLN
4.0000 mg | Freq: Four times a day (QID) | INTRAMUSCULAR | Status: DC
  Administered 2022-01-23: 4 mg via INTRAVENOUS
  Filled 2022-01-23: qty 2

## 2022-01-23 MED ORDER — VANCOMYCIN HCL 1500 MG/300ML IV SOLN: 1500.0000 mg | INTRAVENOUS | Status: DC

## 2022-01-23 MED ORDER — SODIUM CHLORIDE 0.9 % IV SOLN
2.0000 g | Freq: Two times a day (BID) | INTRAVENOUS | Status: DC
  Administered 2022-01-23 – 2022-01-24 (×2): 2 g via INTRAVENOUS
  Filled 2022-01-23 (×2): qty 20

## 2022-01-23 MED ORDER — SODIUM CHLORIDE 0.9 % IV SOLN
2.0000 g | Freq: Three times a day (TID) | INTRAVENOUS | Status: DC
  Filled 2022-01-23: qty 12.5

## 2022-01-23 MED ORDER — PANTOPRAZOLE SODIUM 40 MG IV SOLR
40.0000 mg | Freq: Two times a day (BID) | INTRAVENOUS | Status: DC
  Administered 2022-01-23 – 2022-01-25 (×6): 40 mg via INTRAVENOUS
  Filled 2022-01-23 (×6): qty 10

## 2022-01-23 MED ORDER — METRONIDAZOLE 500 MG/100ML IV SOLN
500.0000 mg | Freq: Three times a day (TID) | INTRAVENOUS | Status: DC
  Administered 2022-01-23 – 2022-01-24 (×3): 500 mg via INTRAVENOUS
  Filled 2022-01-23 (×3): qty 100

## 2022-01-23 MED ORDER — FUROSEMIDE 10 MG/ML IJ SOLN
40.0000 mg | Freq: Once | INTRAMUSCULAR | Status: AC
  Administered 2022-01-23: 40 mg via INTRAVENOUS
  Filled 2022-01-23: qty 4

## 2022-01-23 MED ORDER — LORAZEPAM 2 MG/ML IJ SOLN
1.0000 mg | Freq: Once | INTRAMUSCULAR | Status: AC | PRN
  Administered 2022-01-24: 1 mg via INTRAVENOUS
  Filled 2022-01-23 (×2): qty 1

## 2022-01-23 MED ORDER — K PHOS MONO-SOD PHOS DI & MONO 155-852-130 MG PO TABS
250.0000 mg | ORAL_TABLET | Freq: Three times a day (TID) | ORAL | Status: AC
Start: 2022-01-23 — End: 2022-01-23
  Administered 2022-01-23 (×3): 250 mg via ORAL
  Filled 2022-01-23 (×3): qty 1

## 2022-01-23 MED ORDER — TRIMETHOBENZAMIDE HCL 100 MG/ML IM SOLN
200.0000 mg | Freq: Three times a day (TID) | INTRAMUSCULAR | Status: DC
  Administered 2022-01-23 – 2022-01-26 (×8): 200 mg via INTRAMUSCULAR
  Filled 2022-01-23 (×12): qty 2

## 2022-01-23 NOTE — Evaluation (Signed)
Physical Therapy Evaluation Patient Details Name: [name] [name] MRN: [id number] DOB: January 23, 1951 Today's Date: 01/23/2022  History of Present Illness  Pt is a 71 y.o. male admitted 01/21/22 with fever, chills; workup for sepsis, unclear source of infection. PMH includes CAD, PAF, ischemic cardiomyopathy, DM, GERD, HTN, HLD, depression, obesity.   Clinical Impression  Pt presents with an overall decrease in functional mobility secondary to above. PTA, pt mod indep with intermittent use of SPC, RW and electric scooter; pt lives with supportive wife who assists with ADL/iADLs as needed. Today, pt ambulatory in room without DME; session limited by need for increased time in bathroom (wife reports this is baseline). Wife reports no concerns with pt's mobility at home, and she is able to continue providing necessary assist. Pt would benefit from continued acute PT services to maximize functional mobility and independence prior to d/c home.      Recommendations for follow up therapy are one component of a multi-disciplinary discharge planning process, led by the attending physician.  Recommendations may be updated based on patient status, additional functional criteria and insurance authorization.  Follow Up Recommendations No PT follow up      Assistance Recommended at Discharge Intermittent Supervision/Assistance  Patient can return home with the following  A little help with bathing/dressing/bathroom;Assistance with cooking/housework;Assist for transportation;Help with stairs or ramp for entrance    Equipment Recommendations None recommended by PT  Recommendations for Other Services    Mobility Specialist   Functional Status Assessment Patient has had a recent decline in their functional status and demonstrates the ability to make significant improvements in function in a reasonable and predictable amount of time.     Precautions / Restrictions Precautions Precautions: Fall Restrictions Weight  Bearing Restrictions: No      Mobility  Bed Mobility Overal bed mobility: Modified Independent Bed Mobility: Supine to Sit                Transfers Overall transfer level: Needs assistance Equipment used: None Transfers: Sit to/from Stand Sit to Stand: Supervision           General transfer comment: indep with sit<>stand from EOB and low toilet height, supervision for safety/lines    Ambulation/Gait Ambulation/Gait assistance: Supervision Gait Distance (Feet): 10 Feet Assistive device: None Gait Pattern/deviations: Step-through pattern, Decreased stride length, Wide base of support       General Gait Details: ambulating in room to bathroom without DME, supervision for safety/lines; cues for safety with IV pole which was frustrating pt; wife reports pt likely to need ~20 min to use bathroom limiting further mobility  Stairs            Wheelchair Mobility    Modified Rankin (Stroke Patients Only)       Balance Overall balance assessment: Needs assistance Sitting-balance support: Feet supported Sitting balance-Leahy Scale: Good     Standing balance support: Single extremity supported, No upper extremity supported, During functional activity Standing balance-Leahy Scale: Fair Standing balance comment: ambulatory without UE support                             Pertinent Vitals/Pain Pain Assessment Pain Assessment: Faces Faces Pain Scale: Hurts a little bit Pain Location: back Pain Descriptors / Indicators: Discomfort Pain Intervention(s): Monitored during session    Home Living Family/patient expects to be discharged to:: Private residence Living Arrangements: Spouse/significant other Available Help at Discharge: Family;Available 24 hours/day Type of Home: [place]  Access: Stairs to enter Entrance Stairs-Rails: Psychiatric nurse of Steps: 4   Home Layout: One level Home Equipment: Cane - single [employer] examiner (2 wheels);Wheelchair - manual;BSC/3in1;Grab bars - toilet;Shower seat - built in;Electric scooter      Prior Function Prior Level of Function : Independent/Modified Independent             Mobility Comments: Mod indep with intermittent use of SPC inside (typically does not use device inside), RW for outside. Keeps w/c in truck, also recently got electric scooter ADLs Comments: Pt took showers and sponge bathed with intermittent assist of wife to wash back. Assist from wife for LB dressing     Hand Dominance   Dominant Hand: Right    Extremity/Trunk Assessment   Upper Extremity Assessment Upper Extremity Assessment: Overall WFL for tasks assessed    Lower Extremity Assessment Lower Extremity Assessment: Generalized weakness       Communication   Communication: No difficulties  Cognition Arousal/Alertness: Awake/alert Behavior During Therapy: WFL for tasks assessed/performed Overall Cognitive Status: Within Functional Limits for tasks assessed                                 General Comments: WFL for simple tasks, not formally assesed. wife reports "he's stubborn... likes to do things on his own timeline"        General Comments General comments (skin integrity, edema, etc.): pt's wife present and supportive, reports able to continue providing necessary support at home    Exercises     Assessment/Plan    PT Assessment Patient needs continued PT services  PT Problem List Decreased activity tolerance;Decreased balance;Decreased mobility;Cardiopulmonary status limiting activity       PT Treatment Interventions Gait training;Stair training;Functional mobility training;Therapeutic activities;Therapeutic exercise;Balance training;Patient/family education    PT Goals (Current goals can be found in the Care Plan section)  Acute Rehab PT Goals Patient Stated Goal: return home PT Goal Formulation: With patient Time For Goal Achievement:  02/06/22 Potential to Achieve Goals: Good    Frequency Min 3X/week     Co-evaluation               AM-PAC PT "6 Clicks" Mobility  Outcome Measure Help needed turning from your back to your side while in a flat bed without using bedrails?: None Help needed moving from lying on your back to sitting on the side of a flat bed without using bedrails?: None Help needed moving to and from a bed to a chair (including a wheelchair)?: A Little Help needed standing up from a chair using your arms (e.g., wheelchair or bedside chair)?: A Little Help needed to walk in hospital room?: A Little Help needed climbing 3-5 steps with a railing? : A Little 6 Click Score: 20    End of Session   Activity Tolerance: Patient tolerated treatment well;Other (comment) (session limited by pt needing increased time in bathroom) Patient left: with family/visitor present;with call bell/phone within reach Nurse Communication: Mobility status PT Visit Diagnosis: Other abnormalities of gait and mobility (R26.89)    Time: 7017-7939 PT Time Calculation (min) (ACUTE ONLY): 14 min   Charges:   PT Evaluation $PT Eval Moderate Complexity: [place], PT, DPT Acute Rehabilitation Services  Personal: [facility] Office: [place] 01/23/2022, 1:38 PM

## 2022-01-23 NOTE — Evaluation (Signed)
Occupational Therapy Evaluation Patient Details Name: [name] [name] MRN: [id number] DOB: 1950-11-15 Today's Date: 01/23/2022   History of Present Illness 71 y.o. male with medical history significant of coronary artery disease, paroxysmal atrial fibrillation, ischemic cardiomyopathy with the last EF of 55%, depression, diabetes, morbid obesity, fibromyalgia, GERD, essential hypertension, hyperlipidemia, admitted with fever chills and met sepsis criteria.   Clinical Impression   Patient admitted for the diagnosis above.  PTA he lives at home with his spouse.  Typically uses SPC in the home, occasional use of home O2, and spouse assists with lower body ADL.  Patient primary complaint is abdominal pain and generalized feeling of being lethargic.  He is close to his baseline, but is needing Min guard for basic transfers, and Min A for ADL completion seated.  OT will follow in the acute setting, but no post acute OT is anticipated.        Recommendations for follow up therapy are one component of a multi-disciplinary discharge planning process, led by the attending physician.  Recommendations may be updated based on patient status, additional functional criteria and insurance authorization.   Follow Up Recommendations  No OT follow up    Assistance Recommended at Discharge Intermittent Supervision/Assistance  Patient can return home with the following Assist for transportation;Assistance with cooking/housework;A little help with bathing/dressing/bathroom    Functional Status Assessment  Patient has had a recent decline in their functional status and demonstrates the ability to make significant improvements in function in a reasonable and predictable amount of time.  Equipment Recommendations  None recommended by OT    Recommendations for Other Services       Precautions / Restrictions Precautions Precautions: Fall Precaution Comments: watch O2 Restrictions Weight Bearing Restrictions: No       Mobility Bed Mobility Overal bed mobility: Needs Assistance Bed Mobility: Supine to Sit     Supine to sit: Supervision          Transfers Overall transfer level: Needs assistance Equipment used: None Transfers: Sit to/from Stand Sit to Stand: Supervision                  Balance Overall balance assessment: Needs assistance Sitting-balance support: Feet supported Sitting balance-Leahy Scale: Good     Standing balance support: Single extremity supported Standing balance-Leahy Scale: Fair                             ADL either performed or assessed with clinical judgement   ADL       Grooming: Wash/dry hands;Wash/dry face;Set up;Sitting   Upper Body Bathing: Sitting;Minimal assistance       Upper Body Dressing : Minimal assistance;Sitting   Lower Body Dressing: Moderate assistance;Sit to/from stand   Toilet Transfer: Min guard;Rolling walker (2 wheels);Regular Toilet                   Vision Patient Visual Report: No change from baseline       Perception Perception Perception: Within Functional Limits   Praxis Praxis Praxis: Intact    Pertinent Vitals/Pain Pain Assessment Pain Assessment: Faces Faces Pain Scale: Hurts little more Pain Location: abdomen Pain Descriptors / Indicators: Tender Pain Intervention(s): Monitored during session, Patient requesting pain meds-RN notified     Hand Dominance Right   Extremity/Trunk Assessment Upper Extremity Assessment Upper Extremity Assessment: Overall WFL for tasks assessed   Lower Extremity Assessment Lower Extremity Assessment: Defer to PT evaluation   Cervical /  Trunk Assessment Cervical / Trunk Assessment: Kyphotic   Communication Communication Communication: No difficulties   Cognition Arousal/Alertness: Awake/alert Behavior During Therapy: WFL for tasks assessed/performed Overall Cognitive Status: Within Functional Limits for tasks assessed                                        General Comments   VSS on supplemental O2    Exercises     Shoulder Instructions      Home Living Family/patient expects to be discharged to:: Private residence Living Arrangements: Spouse/significant other Available Help at Discharge: Family;Available 24 hours/day Type of Home: House Home Access: Stairs to enter [employer] of Steps: 4 Entrance Stairs-Rails: Right;Left Home Layout: One level     Bathroom Shower/Tub: Occupational psychologist: Standard Bathroom Accessibility: Yes How Accessible: Accessible via walker Home Equipment: [place] - single point;Rolling Walker (2 wheels);Wheelchair - manual;BSC/3in1;Grab bars - toilet;Shower seat - built in          Prior Functioning/Environment Prior Level of Function : Independent/Modified Independent             Mobility Comments: Using the cane more for inside, and the RW for outside.  Keeps his wheelchair in the truck.  Recently got a scooter. ADLs Comments: Pt took showers and sponge bathed without assist from spouse, but spouuse assists with lower body dressing.        OT Problem List: Decreased strength;Impaired balance (sitting and/or standing);Decreased activity tolerance      OT Treatment/Interventions: Self-care/ADL training;Therapeutic activities;Balance training    OT Goals(Current goals can be found in the care plan section) Acute Rehab OT Goals Patient Stated Goal: Ready to return home OT Goal Formulation: With patient Time For Goal Achievement: 02/06/22 Potential to Achieve Goals: Good ADL Goals Pt Will Perform Grooming: Independently;sitting Pt Will Perform Upper Body Bathing: with set-up;sitting Pt Will Perform Upper Body Dressing: with set-up;sitting Pt Will Transfer to Toilet: with modified independence;ambulating;regular height toilet  OT Frequency: Min 2X/week    Co-evaluation              AM-PAC OT "6 Clicks" Daily Activity      Outcome Measure Help from another person eating meals?: None Help from another person taking care of personal grooming?: None Help from another person toileting, which includes using toliet, bedpan, or urinal?: A Little Help from another person bathing (including washing, rinsing, drying)?: A Lot Help from another person to put on and taking off regular upper body clothing?: A Little Help from another person to put on and taking off regular lower body clothing?: A Lot 6 Click Score: 18   End of Session Equipment Utilized During Treatment: Oxygen Nurse Communication: Mobility status  Activity Tolerance: Patient tolerated treatment well Patient left: in bed;with call bell/phone within reach  OT Visit Diagnosis: Unsteadiness on feet (R26.81)                Time: 3383-2919 OT Time Calculation (min): 26 min Charges:  OT General Charges $OT Visit: 1 Visit OT Evaluation $OT Eval Moderate Complexity: 1 Mod OT Treatments $Self Care/Home Management : 8-22 mins  01/23/2022  RP, OTR/L  Acute Rehabilitation Services  Office:  [phone]   [name] [name] 01/23/2022, 8:48 AM

## 2022-01-23 NOTE — Consult Note (Addendum)
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 cholecystitis. Electronically Signed   By: Virgina Norfolk M.D.   On: 01/21/2022 22:51   CT ABDOMEN PELVIS W CONTRAST  Result Date: 01/21/2022 CLINICAL DATA:  Acute abdominal pain. EXAM: CT ABDOMEN AND PELVIS WITH CONTRAST TECHNIQUE: Multidetector CT imaging of the abdomen and pelvis was performed using the standard protocol following bolus administration of intravenous contrast. RADIATION DOSE REDUCTION: This exam was performed according to the departmental dose-optimization program which includes automated exposure control, adjustment of the mA and/or kV according to patient size and/or use of iterative reconstruction technique. CONTRAST:  34mL OMNIPAQUE IOHEXOL 300 MG/ML  SOLN COMPARISON:  CT 12/17/2021 FINDINGS: Lower chest: Trace right pleural effusion is new from prior exam. Breathing motion artifact limits detailed assessment. Heart is upper normal in size. There are  coronary artery calcifications. Hepatobiliary: The liver is enlarged spanning 22.9 cm cranial caudal with diffuse steatosis. No evidence of focal liver lesion. There is layering hyperdensity in the gallbladder which may represent stones/sludge. Question of mild pericholecystic fat stranding, not well assessed due to motion. No biliary dilatation. Small volume of perihepatic ascites. Pancreas: Fatty atrophy.  No ductal dilatation or inflammation. Spleen: Normal in size. No obvious focal abnormality, motion limited evaluation. Adrenals/Urinary Tract: Normal adrenal glands. No hydronephrosis. No renal calculi. Mild symmetric perinephric edema, also seen on prior no suspicious renal lesion. Unremarkable urinary bladder. Stomach/Bowel: Motion artifact through the upper abdomen limits bowel assessment. No inflammation of the stomach or duodenum. There is no small bowel obstruction or small bowel inflammation. Normal appendix visualized. Small volume of colonic stool. Sigmoid colon is redundant. Vascular/Lymphatic: Aortic atherosclerosis. Calcified noncalcified atheromatous plaque. No aneurysm. Prominent periportal node measuring 12 mm, likely reactive. No suspicious adenopathy. Reproductive: Prostate is unremarkable. Other: Small amount of perihepatic ascites. Minimal free fluid in the right pericolic gutter which tracks into the pelvis. No free air. Fat within both inguinal canals, right greater than left. Musculoskeletal: Stable degenerative change in the spine. There are no acute or suspicious osseous abnormalities. IMPRESSION: 1. Layering hyperdensity in the gallbladder may represent stones/sludge. Question of mild pericholecystic fat stranding, not well assessed due to motion artifact. Right upper quadrant ultrasound is planned. 2. Hepatomegaly and hepatic steatosis. Small volume of perihepatic and right pericolic gutter ascites. 3. Trace right pleural effusion, new from prior exam. Aortic Atherosclerosis  (ICD10-I70.0). Electronically Signed   By: Keith Rake M.D.   On: 01/21/2022 22:27   DG Chest 2 View  Result Date: 01/21/2022 CLINICAL DATA:  Crackles in chest EXAM: CHEST - 2 VIEW COMPARISON:  01/20/2022, 12/17/2021 FINDINGS: Electronic device over left chest. Cardiomegaly with mild central congestion. No focal  airspace disease, pleural effusion, or pneumothorax. Aortic atherosclerosis. IMPRESSION: Cardiomegaly with mild central congestion Electronically Signed   By: Donavan Foil M.D.   On: 01/21/2022 20:38      Impression / Plan:   Impression: 1.  Sepsis: With leukocytosis, tachycardia and tachypnea and lactic acidosis, unclear source of infection but history of recurrent Streptococcus bacteremia, CT of the abdomen and right upper quadrant ultrasound with cholelithiasis and gallbladder wall thickening, HIDA scan normal, does have history of gangrenous right foot, cultures pending, continued on broad-spectrum antibiotics, white count improving 2.  A-fib with RVR: Continued on Plavix, Eliquis on hold and instead on heparin drip 3.  Diabetes 4.  PVD 5.  AKI on CKD stage III 6.  CAD: On Plavix and heparin 7.  Iron deficiency anemia: met with his primary GI team back in June with recommendations for EGD and colonoscopy which was to be scheduled 8.  Elevated LFTs: LFTs did trend up over the past 2 to 3 days, but back downward today; consider relation to daily given strong antibiotics recently +/- ischemic event given elevated troponins 9.  Elevated troponin: Question ischemic event, recommend cardiology consult 10.  Nausea and epigastric pain: History of reflux, apparently unable to tolerate p.o. much at all given nausea; consider relation of strong antibiotics +/- known GERD +/- gallbladder etiology versus other  Plan: 1.  We will change Pantoprazole to 40 mg IV twice daily 2.  Recommend Zofran 4 mg scheduled every 6 hours instead of as needed-changed 3.  Patient seems to be able to get in  a few bites of crackers, but in general cannot tolerate much food, will back down to full liquids and can be advanced as tolerated with addition of medications as above 4.  Discussed with patient that eventually he will need EGD and colonoscopy for further evaluation of his IDA and ongoing symptoms, given his elevated troponin and other concerns this hospitalization likely this will be with his primary GI as an outpatient unless he develops worsening. 5.  Continue to trend LFTs, if these increase tomorrow we will consider adding on further serologies otherwise hopefully they will downtrend from here 6. Recommend cardiology consult if not already placed  Thank you for your kind consultation, we will continue to follow.  Lavone Nian [facility]  01/23/2022, 9:38 AM   Attending physician's note  I have taken a history, reviewed the chart and examined the patient. I performed a substantive portion of this encounter, including complete performance of at least one of the key components, in conjunction with the APP. I agree with the APP's note, impression and recommendations.    71 year old gentleman with paroxysmal A-fib on Eliquis, CAD with cardiomyopathy on Plavix admitted with SIRS bacteremia He has Streptococcus bacteremia and Enterococcus infection in the urine  Acute elevation in transaminases likely secondary to hypoperfusion of liver in the setting of sepsis, cannot exclude drug-induced liver injury HIDA scan negative for biliary obstruction or acute cholecystitis though he does have mild gallbladder wall thickening with cholelithiasis  Continue to monitor LFT Slowly advance diet as tolerated He will benefit from EGD and colonoscopy but will defer procedure until his hemodynamic status is optimized /stabilized  GI will continue to follow along   The patient was provided an opportunity to ask questions and all were answered. The patient agreed with the plan and demonstrated an understanding of  the instructions.  Damaris Hippo , MD [phone]

## 2022-01-23 NOTE — Progress Notes (Signed)
Progress Note  Patient: [name] [name] JAS:[id number] DOB: 06-04-1950  DOA: 01/21/2022  DOS: 01/23/2022    Brief hospital course: [name] [name] is a 71 y.o. male with a history of PVD, right foot toe gangrene, PAF on eliquis, CAD/ICM/chronic HFpEF, IDT2DM, recurrent streptococcal bacteremia, morbid obesity, HTN, HLD who presented to the ED at [facility] on 9/6 on the advice of ID due to subjective fever, chills, abdominal pain, nausea, vomiting and shortness of breath. He had been seen 9/5 in the [facility] for shortness of breath which resolved. Here he qualified for diagnosis of SIRS though unclear source, with mild AKI and LFT elevations. WBC 21k with left shift. Troponin was elevated and ECG revealed AFib with RBBB, nonspecific ST-T changes. CXR showed cardiomegaly with central vascular congestion. He was hypoxic.  CT A/P raises concern for gallbladder stones/sludge, questionable pericholecystic fat stranding, hepatomegaly, hepatic steatosis, small volume ascites and trace right pleural effusion.  RUQ [place] with cholelithiasis and mild gallbladder wall thickening without evidence of acute cholecystitis. HIDA scan was negative. Broad IV antibiotics were started and the patient admitted for SIRS. Troponin and LFTs trended upward, though the patient has no chest pain. Blood cultures have remained NGTD. GI has been consulted for elevated LFTs.  Assessment and Plan: SIRS: Hx Strep bacteremia, though Cx's NGTD. Positive gallbladder findings on imaging though HIDA negative, not consistent with cholecystitis. There is gangrenous change to distal toes though this is improved from prior. Urine culture from ED 9/5 grew 3k of sensitive Enterococcus. Patient give limited history, but denies urinary symptoms.  - Continue broad IV abx. Given his complicated history and unclear source, would appreciate ID recommendations going forward.  - Monitor cultures regularly.   Acute hypoxic respiratory failure, pleural effusion, pulmonary edema:  VBG reassuring, pulse oximetry complicated by PVD. No PNA on CXR, doubt PE on eliquis.  - BNP rising, though oral intake has been minimal. Since he got IVF for sepsis protocol and hypoxia worsened, will give lasix '40mg'$  IV x1 and wean O2 as tolerated.   Troponin elevation, CAD, chronic HFpEF: More prominent RBBB pattern on recent ECG than prior, though no substernal chest pain.  - Trend troponin. As troponin rose after hemodynamics improved, unclear whether this is demand ischemia. Would appreciate cardiology recommendations.  - Repeat ECG this AM with nonspecific changes - Continue heparin, beta blocker. Statin intolerant as below.  Uncontrolled IDT2DM with hyperglycemia: HbA1c 7.8%.  - Near inpatient goal, continue basal-bolus insulin  Cholelithiasis: No evidence of acute cholecystitis by HIDA.   Nausea, vomiting: Stable - Scheduled antiemetic per GI.   Prolonged QT interval:  - Monitor on telemetry - Keep K/Mg replete.  - Will hold SSRI for now with concomitant scheduled zofran and amiodarone.   Stage 2 pressure injury to bilateral buttocks, POA: Offload as able. Does not appear purulent.   Morbid obesity: Body mass index is 36.63 kg/m.   AKI on stage IIIa CKD: Suspected to be prerenal as it has improved with IVF.  - Hold IVF with crackles on pulmonary exam, hypoxia, effusions. Give lasix x1. Monitor BMP. Transition away from vancomycin as soon as reasonable.   PAF: Continues to be in AFib currently, though rate is controlled - Continue metoprolol, heparin.   Depression: Quiescent.  - Hold prozac as above for now.   Lactic acidosis: Improved.   GERD:  - Augment PPI per GI.   Hypophosphatemia:  - Supplement.   Subjective: Feels unwell but has difficulty explaining this in any way. +Nausea, but  no vomiting today. Some trouble getting air, but denied shortness of breath. Clearly states no chest pain. Abdominal pain absent, +tenderness though.   Objective: Vitals:    01/23/22 0408 01/23/22 0725 01/23/22 0811 01/23/22 1048  BP: 119/78 129/89 119/71 121/72  Pulse: 79 88  84  Resp: '15 18  15  '$ Temp: 98 F (36.7 C) 97.7 F (36.5 C)  (!) 97.5 F (36.4 C)  TempSrc: Oral Oral  Oral  SpO2: 96% 99%  100%  Weight:      Height:       Gen: Chronically ill-appearing 71 y.o. male in no distress Pulm: Nonlabored breathing supplemental oxygen, diminished at bases with crackles.  CV: Irreg without murmur, rub, or gallop. No JVD, 1+ dependent edema. GI: Abdomen soft, tender diffusely, absent Murphy's sign, +BS.  Ext: Cool, dry Skin: Distal toes bilaterally with eschar, right has gangrene without odor or discharge. Ill-defined distal lower leg erythema bilaterally that is not tender. No other/new rashes, lesions or ulcers on visualized skin. Sacrum not examined this morning. Neuro: Alert, slow to respond but oriented without asterixis or focal neurological deficits. Psych: Judgement and insight appear fair, recall is marginal. Mood euthymic & affect congruent. Behavior is appropriate.    Data Personally reviewed: CBC: Recent Labs  Lab 01/20/22 0129 01/21/22 1911 01/22/22 0450 01/22/22 1439 01/23/22 0732  WBC 10.4 20.9* 19.0*  --  12.4*  NEUTROABS 8.5* 18.1*  --   --  10.1*  HGB 11.1* 11.5* 10.4* 11.6* 9.8*  HCT 37.8* 38.2* 36.3* 34.0* 33.3*  MCV 78.8* 77.6* 80.5  --  80.2  PLT 301 348 303  --  211   Basic Metabolic Panel: Recent Labs  Lab 01/20/22 0129 01/21/22 1911 01/22/22 0450 01/22/22 1433 01/22/22 1439 01/23/22 0732  NA 136 132* 133* 135 135 135  K 4.3 4.1 4.3 4.2 4.1 3.9  CL 97* 95* 98 101  --  100  CO2 28 24 21* 22  --  22  GLUCOSE 249* 184* 173* 155*  --  132*  BUN 24* 28* 30* 29*  --  29*  CREATININE 1.57* 1.91* 1.81* 1.68*  --  1.45*  CALCIUM 9.4 9.5 8.9 8.6*  --  9.0  MG  --   --   --   --   --  2.2  PHOS  --   --   --   --   --  2.3*   GFR: Estimated Creatinine Clearance: 66.8 mL/min (A) (by C-G formula based on SCr of 1.45  mg/dL (H)). Liver Function Tests: Recent Labs  Lab 01/20/22 0129 01/21/22 1911 01/22/22 0450 01/22/22 1433 01/23/22 0732  AST 20 243* 522* 1,516* 647*  ALT 15 139* 318* 720* 670*  ALKPHOS 48 50 48 44 47  BILITOT 1.1 1.6* 2.3* 1.8* 2.2*  PROT 8.1 8.7* 7.3 7.0 7.2  ALBUMIN 4.0 4.1 3.4* 3.2* 3.2*   Recent Labs  Lab 01/21/22 2130 01/23/22 0732  LIPASE 21 28   No results for input(s): "AMMONIA" in the last 168 hours. Coagulation Profile: Recent Labs  Lab 01/20/22 0129 01/21/22 1911 01/22/22 0450  INR 1.4* 2.0* 2.3*   Cardiac Enzymes: Recent Labs  Lab 01/22/22 0450 01/23/22 0732  CKTOTAL 180 174   BNP (last 3 results) No results for input(s): "PROBNP" in the last 8760 hours. HbA1C: No results for input(s): "HGBA1C" in the last 72 hours. CBG: Recent Labs  Lab 01/22/22 1121 01/22/22 1715 01/22/22 2110 01/23/22 0619 01/23/22 1050  GLUCAP 179* 149* 136*  148* 158*   Lipid Profile: Recent Labs    01/23/22 0732  CHOL 127  HDL 11*  LDLCALC 76  TRIG 199*  CHOLHDL 11.5   Thyroid Function Tests: No results for input(s): "TSH", "T4TOTAL", "FREET4", "T3FREE", "THYROIDAB" in the last 72 hours. Anemia Panel: No results for input(s): "VITAMINB12", "FOLATE", "FERRITIN", "TIBC", "IRON", "RETICCTPCT" in the last 72 hours. Urine analysis:    Component Value Date/Time   COLORURINE YELLOW 01/22/2022 0042   APPEARANCEUR CLEAR 01/22/2022 0042   APPEARANCEUR Clear 10/23/2021 1224   LABSPEC 1.030 01/22/2022 0042   PHURINE 6.0 01/22/2022 0042   GLUCOSEU >=500 (A) 01/22/2022 0042   HGBUR SMALL (A) 01/22/2022 0042   BILIRUBINUR NEGATIVE 01/22/2022 0042   BILIRUBINUR Negative 10/23/2021 1224   KETONESUR 5 (A) 01/22/2022 0042   PROTEINUR 100 (A) 01/22/2022 0042   UROBILINOGEN negative 08/13/2014 1725   UROBILINOGEN 0.2 08/07/2013 1747   NITRITE NEGATIVE 01/22/2022 0042   LEUKOCYTESUR NEGATIVE 01/22/2022 0042   Recent Results (from the past 240 hour(s))  Blood Culture  (routine x 2)     Status: None (Preliminary result)   Collection Time: 01/20/22  1:29 AM   Specimen: BLOOD  Result Value Ref Range Status   Specimen Description BLOOD RAC  Final   Special Requests   Final    BOTTLES DRAWN AEROBIC AND ANAEROBIC Blood Culture adequate volume   Culture   Final    NO GROWTH 2 DAYS Performed at [facility], [address] [place], [place] [address]    Report Status PENDING  Incomplete  Blood Culture (routine x 2)     Status: None (Preliminary result)   Collection Time: 01/20/22  1:29 AM   Specimen: BLOOD  Result Value Ref Range Status   Specimen Description BLOOD RFOA  Final   Special Requests   Final    BOTTLES DRAWN AEROBIC AND ANAEROBIC Blood Culture adequate volume   Culture   Final    NO GROWTH 2 DAYS Performed at [facility], [address], [place], [place] [address]    Report Status PENDING  Incomplete  SARS Coronavirus 2 by RT PCR (hospital order, performed in [place] hospital lab) *cepheid single result test* Urine, Clean Catch     Status: None   Collection Time: 01/20/22  3:53 AM   Specimen: Urine, Clean Catch; Nasal Swab  Result Value Ref Range Status   SARS Coronavirus 2 by RT PCR NEGATIVE NEGATIVE Final    Comment: (NOTE) SARS-CoV-2 target nucleic acids are NOT DETECTED.  The SARS-CoV-2 RNA is generally detectable in upper and lower respiratory specimens during the acute phase of infection. The lowest concentration of SARS-CoV-2 viral copies this assay can detect is 250 copies / mL. A negative result does not preclude SARS-CoV-2 infection and should not be used as the sole basis for treatment or other patient management decisions.  A negative result may occur with improper specimen collection / handling, submission of specimen other than nasopharyngeal swab, presence of viral mutation(s) within the areas targeted by this assay, and inadequate number of viral copies (<250 copies / mL). A negative result must be combined with  clinical observations, patient history, and epidemiological information.  Fact Sheet for Patients:   https://www.patel.info/  Fact Sheet for Healthcare Providers: https://hall.com/  This test is not yet approved or  cleared by the [place] FDA and has been authorized for detection and/or diagnosis of SARS-CoV-2 by FDA under an Emergency Use Authorization (EUA).  This EUA will remain in effect (  meaning this test can be used) for the duration of the COVID-19 declaration under Section 564(b)(1) of the Act, 21 U.S.C. section 360bbb-3(b)(1), unless the authorization is terminated or revoked sooner.  Performed at [facility], [address], [place], [place] [address]   Urine Culture     Status: Abnormal   Collection Time: 01/20/22  3:57 AM   Specimen: In/Out Cath Urine  Result Value Ref Range Status   Specimen Description   Final    IN/OUT CATH URINE Performed at [facility], [address], [place], [place] [address]    Special Requests   Final    NONE Performed at [facility], [address], [place], [place] [address]    Culture 3,000 COLONIES/mL ENTEROCOCCUS FAECALIS (A)  Final   Report Status 01/22/2022 FINAL  Final   Organism ID, Bacteria ENTEROCOCCUS FAECALIS (A)  Final      Susceptibility   Enterococcus faecalis - MIC*    AMPICILLIN <=2 SENSITIVE Sensitive     NITROFURANTOIN <=16 SENSITIVE Sensitive     VANCOMYCIN 2 SENSITIVE Sensitive     * 3,000 COLONIES/mL ENTEROCOCCUS FAECALIS  Blood culture (routine x 2)     Status: None (Preliminary result)   Collection Time: 01/21/22  9:30 PM   Specimen: BLOOD  Result Value Ref Range Status   Specimen Description BLOOD BLOOD RIGHT ARM  Final   Special Requests   Final    BOTTLES DRAWN AEROBIC AND ANAEROBIC Blood Culture adequate volume   Culture   Final    NO GROWTH 2 DAYS Performed at [facility], [place] [address], [place], [place] [address]    Report Status PENDING   Incomplete  Resp Panel by RT-PCR (Flu A&B, Covid) Anterior Nasal Swab     Status: None   Collection Time: 01/21/22 11:04 PM   Specimen: Anterior Nasal Swab  Result Value Ref Range Status   SARS Coronavirus 2 by RT PCR NEGATIVE NEGATIVE Final    Comment: (NOTE) SARS-CoV-2 target nucleic acids are NOT DETECTED.  The SARS-CoV-2 RNA is generally detectable in upper respiratory specimens during the acute phase of infection. The lowest concentration of SARS-CoV-2 viral copies this assay can detect is 138 copies/mL. A negative result does not preclude SARS-Cov-2 infection and should not be used as the sole basis for treatment or other patient management decisions. A negative result may occur with  improper specimen collection/handling, submission of specimen other than nasopharyngeal swab, presence of viral mutation(s) within the areas targeted by this assay, and inadequate number of viral copies(<138 copies/mL). A negative result must be combined with clinical observations, patient history, and epidemiological information. The expected result is Negative.  Fact Sheet for Patients:  EntrepreneurPulse.com.au  Fact Sheet for Healthcare Providers:  IncredibleEmployment.be  This test is no t yet approved or cleared by the [place] FDA and  has been authorized for detection and/or diagnosis of SARS-CoV-2 by FDA under an Emergency Use Authorization (EUA). This EUA will remain  in effect (meaning this test can be used) for the duration of the COVID-19 declaration under Section 564(b)(1) of the Act, 21 U.S.C.section 360bbb-3(b)(1), unless the authorization is terminated  or revoked sooner.       Influenza A by PCR NEGATIVE NEGATIVE Final   Influenza B by PCR NEGATIVE NEGATIVE Final    Comment: (NOTE) The Xpert Xpress SARS-CoV-2/FLU/RSV plus assay is intended as an aid in the diagnosis of influenza from Nasopharyngeal swab specimens and should not be  used as a sole  basis for treatment. Nasal washings and aspirates are unacceptable for Xpert Xpress SARS-CoV-2/FLU/RSV testing.  Fact Sheet for Patients: EntrepreneurPulse.com.au  Fact Sheet for Healthcare Providers: IncredibleEmployment.be  This test is not yet approved or cleared by the [place] FDA and has been authorized for detection and/or diagnosis of SARS-CoV-2 by FDA under an Emergency Use Authorization (EUA). This EUA will remain in effect (meaning this test can be used) for the duration of the COVID-19 declaration under Section 564(b)(1) of the Act, 21 U.S.C. section 360bbb-3(b)(1), unless the authorization is terminated or revoked.  Performed at [facility], [place] [address] [place], [place] [address]   MRSA Next Gen by PCR, Nasal     Status: None   Collection Time: 01/22/22  8:30 PM   Specimen: Nasal Mucosa; Nasal Swab  Result Value Ref Range Status   MRSA by PCR Next Gen NOT DETECTED NOT DETECTED Final    Comment: (NOTE) The GeneXpert MRSA Assay (FDA approved for NASAL specimens only), is one component of a comprehensive MRSA colonization surveillance program. It is not intended to diagnose MRSA infection nor to guide or monitor treatment for MRSA infections. Test performance is not FDA approved in patients less than 22 years old. Performed at [facility], [place] [address], [place], [place] [address]      NM Hepatobiliary Liver Func  Result Date: 01/22/2022 CLINICAL DATA:  Abdominal pain.  Biliary obstruction suspected EXAM: NUCLEAR MEDICINE HEPATOBILIARY IMAGING TECHNIQUE: Sequential images of the abdomen were obtained out to 60 minutes following intravenous administration of radiopharmaceutical. RADIOPHARMACEUTICALS:  7.6 mCi Tc-81m Choletec IV COMPARISON:  01/21/2022 FINDINGS: Prompt uptake and biliary excretion of activity by the liver is seen. Gallbladder activity is visualized, consistent with patency of  cystic duct. Biliary activity passes into small bowel, consistent with patent common bile duct. IMPRESSION: Normal scintigraphic hepatobiliary scan without evidence of biliary obstruction. Electronically Signed   By: [name]   On: 01/22/2022 14:21   [place]Abdomen Limited RUQ (LIVER/GB)  Result Date: 01/21/2022 CLINICAL DATA:  Abdominal pain. EXAM: ULTRASOUND ABDOMEN LIMITED RIGHT UPPER QUADRANT COMPARISON:  None Available. FINDINGS: Gallbladder: Numerous ill-defined shadowing echogenic gallstones are seen within the gallbladder lumen. The gallbladder wall measures 4.01 mm in thickness. No sonographic Murphy sign noted by sonographer. Common bile duct: Diameter: 6.5 mm Liver: No focal lesion identified. Within normal limits in parenchymal echogenicity. Portal vein is patent on color Doppler imaging with normal direction of blood flow towards the liver. Other: None. IMPRESSION: Cholelithiasis and mild gallbladder wall thickening, without evidence of acute cholecystitis. Electronically Signed   By: TVirgina NorfolkM.D.   On: 01/21/2022 22:51   CT ABDOMEN PELVIS W CONTRAST  Result Date: 01/21/2022 CLINICAL DATA:  Acute abdominal pain. EXAM: CT ABDOMEN AND PELVIS WITH CONTRAST TECHNIQUE: Multidetector CT imaging of the abdomen and pelvis was performed using the standard protocol following bolus administration of intravenous contrast. RADIATION DOSE REDUCTION: This exam was performed according to the departmental dose-optimization program which includes automated exposure control, adjustment of the mA and/or kV according to patient size and/or use of iterative reconstruction technique. CONTRAST:  81mOMNIPAQUE IOHEXOL 300 MG/ML  SOLN COMPARISON:  CT 12/17/2021 FINDINGS: Lower chest: Trace right pleural effusion is new from prior exam. Breathing motion artifact limits detailed assessment. Heart is upper normal in size. There are coronary artery calcifications. Hepatobiliary: The liver is enlarged  spanning 22.9 cm cranial caudal with diffuse steatosis. No evidence of focal liver lesion. There is layering  hyperdensity in the gallbladder which may represent stones/sludge. Question of mild pericholecystic fat stranding, not well assessed due to motion. No biliary dilatation. Small volume of perihepatic ascites. Pancreas: Fatty atrophy.  No ductal dilatation or inflammation. Spleen: Normal in size. No obvious focal abnormality, motion limited evaluation. Adrenals/Urinary Tract: Normal adrenal glands. No hydronephrosis. No renal calculi. Mild symmetric perinephric edema, also seen on prior no suspicious renal lesion. Unremarkable urinary bladder. Stomach/Bowel: Motion artifact through the upper abdomen limits bowel assessment. No inflammation of the stomach or duodenum. There is no small bowel obstruction or small bowel inflammation. Normal appendix visualized. Small volume of colonic stool. Sigmoid colon is redundant. Vascular/Lymphatic: Aortic atherosclerosis. Calcified noncalcified atheromatous plaque. No aneurysm. Prominent periportal node measuring 12 mm, likely reactive. No suspicious adenopathy. Reproductive: Prostate is unremarkable. Other: Small amount of perihepatic ascites. Minimal free fluid in the right pericolic gutter which tracks into the pelvis. No free air. Fat within both inguinal canals, right greater than left. Musculoskeletal: Stable degenerative change in the spine. There are no acute or suspicious osseous abnormalities. IMPRESSION: 1. Layering hyperdensity in the gallbladder may represent stones/sludge. Question of mild pericholecystic fat stranding, not well assessed due to motion artifact. Right upper quadrant ultrasound is planned. 2. Hepatomegaly and hepatic steatosis. Small volume of perihepatic and right pericolic gutter ascites. 3. Trace right pleural effusion, new from prior exam. Aortic Atherosclerosis (ICD10-I70.0). Electronically Signed   By: Keith Rake M.D.   On:  01/21/2022 22:27   DG Chest 2 View  Result Date: 01/21/2022 CLINICAL DATA:  Crackles in chest EXAM: CHEST - 2 VIEW COMPARISON:  01/20/2022, 12/17/2021 FINDINGS: Electronic device over left chest. Cardiomegaly with mild central congestion. No focal airspace disease, pleural effusion, or pneumothorax. Aortic atherosclerosis. IMPRESSION: Cardiomegaly with mild central congestion Electronically Signed   By: Donavan Foil M.D.   On: 01/21/2022 20:38    Family Communication: None at bedside  Disposition: Status is: Inpatient Remains inpatient appropriate because: Ongoing work up, contations Planned Discharge Destination: Home with [place], MD 01/23/2022 12:01 PM Page by [email or web address]

## 2022-01-23 NOTE — Consult Note (Signed)
Date of Admission:  01/21/2022          Reason for Consult: Sepsis without clear source   Referring Provider: Vance Gather, MD   Assessment:  Sepsis in patient with recurrent GBS bacteremia Rule out CNS infection given ongoing HA, hx of frontal skull fracture and what sounds like possible CSF leak Dry gangrene in context of severe PVD Right ankle ulcer Hepatitis presumably due to hypoperfusion event Demand ischemia with + troponins ARF Cholelithiasis with mild gallbladder wall thickening but without radiographic evidence of cholecystitis Loose bowel movements CAD PAF Third spacing of fluid with edema pleural effusions and small amount of ascites  Plan:  Narrow to ceftriaxone and Flagyl MRI brain with and wo contrast and MRI C-spine with and without contrast to look for potential brain abscess infection of C-spine and evidence of CSF leak MRI left foot with and without contrast MRI right ankle with and without contrast He may benefit from having oral amoxicillin or amoxicillin clavulanic acid at home for multiple reasons (see discussion below to have on hand for signs of infection  Principal Problem:   SIRS (systemic inflammatory response syndrome) ([place]) Active Problems:   Hyperlipidemia associated with type 2 diabetes mellitus (HCC)   CAD S/P percutaneous coronary angioplasty   GERD (gastroesophageal reflux disease)   AKI (acute kidney injury) ([place])   Diabetes ([place])   Class II obesity   Anxiety   CHF (congestive heart failure) (HCC)   PAF (paroxysmal atrial fibrillation) (HCC)   PAOD (peripheral arterial occlusive disease) (HCC)   Coronary artery disease of native artery of native heart with stable angina pectoris (HCC)   Dry gangrene (HCC)   Elevated LFTs   Nausea and vomiting   Choledocholithiasis   Goals of care, counseling/discussion   Lactic acidosis   Scheduled Meds:  clopidogrel  75 mg Oral Daily   DULoxetine  90 mg Oral Daily   ezetimibe  10 mg Oral  Daily   furosemide  40 mg Intravenous Once   gabapentin  300 mg Oral TID   insulin aspart  0-20 Units Subcutaneous TID WC   insulin aspart  0-5 Units Subcutaneous QHS   isosorbide mononitrate  30 mg Oral Daily   metoprolol succinate  50 mg Oral Daily   ondansetron (ZOFRAN) IV  4 mg Intravenous Q6H   pantoprazole (PROTONIX) IV  40 mg Intravenous Q12H   phosphorus  250 mg Oral TID   Continuous Infusions:  cefTRIAXone (ROCEPHIN)  IV     heparin 1,950 Units/hr (01/23/22 0953)   metronidazole     PRN Meds:.acetaminophen **OR** acetaminophen, busPIRone, LORazepam, nitroGLYCERIN  HPI: [name] [name] is a 71 y.o. male with a history of coronary disease status post stents peripheral vascular disease that required multiple vascular interventions (including March 23 with redo left femoral-popliteal bypass graft, 423 stent of right tibioperoneal trunk and peroneal artery still with residual dry gangrene, ACDF surgery who has been seen by my partner Dr. West Bali for recurrent group B streptococcal bacteremia (2 episodes with first in March 2023 and second in August of 2023, admitted with septic picture with fever chills abdominal pain nausea vomiting confusion with acute renal failure lactic acidosis leukocytosis with neutrophilic predominance.  Blood cultures were taken and he was started on broad-spectrum antibiotics.  His fevers have defervesced and he is clinically improving though source of his septic picture remains unclear.  During this hospitalization course he has developed a hepatitis that I suspect is due to a hypoperfusion  event of the liver also with positive troponins I expect due to demand ischemia.  During his PAST  hospitalization in August he had an extensive work-up for the source of his group B streptococcal bacteremia.  He had a transesophageal echocardiogram that failed to show evidence of endocarditis.  He had CT chest abdomen pelvis performed that failed to show evidence of  infection.  He had bilateral foot films that did not show evidence of osteomyelitis.  He had MRI of the thoracic and lumbar spine that did not show evidence of discitis osteomyelitis or epidural abscess.  Was transitioned from IV antibiotics to oral amoxicillin and completed 2 weeks of treatment.  Less likely source of infection was thought to be his dry gangrene in his left foot.  Area however had improved compared to how it appeared months ago.    Beginning of September he began having subjective fevers and objective fevers up to 102 degrees.  In talking the patient he said that he has had headaches and that he had fullness in his ears and felt as if he was "going up into the mountains" also had nausea abdominal pain.  He was seen in the ER on September 4.  At that time EMS apparently been called for concerns of shortness of breath.  He was having some symptomatic "chest fluttering as well.  Reportedly had some purulent material coming from under one of the scabs and showed an and he was prescribed doxycycline by the ER staff who saw him at that time.  Blood cultures have been taken in the ER were without growth  Urine cultures had shown 3000 colony-forming's units of a faecalis  He then did a video visit with my partner Dr. West Bali (visit was intended to be in person but was virtual due to his feeling fatigued)    In talking to the patient and his wife today his wife recounted the fact that the patient had fallen on his face as a child and sustained a skull fracture.  The patient described to have "my eye even popped out and had to be put back in place by a doctor who put a patch on my and told my mom and not let me watch TV for 2 weeks"  He showed me the irregularity on his skull where he had this fracture and the symmetry to his face that he pointed out.  He says he has had chronic congestion in his left sinus.  In questioning him about fluid leaking from his sinuses into his  mouth or into his ears he endorsed drainage from his ears chronically and from his sinsuses. He showed me some flaky material in each ear when I saw him though I did not see overt fluid.  He did questioning stated that the fluid that comes in the back of his mouth could have a sugary taste (I have some concern ? That my questions could have led to "yes's"  His history of skull fracture and symptoms of drainage from the ears and sinuses I do think that a CSF leak is possible.  I reviewed his prior imaging of the brain including CT scans and MRIs performed in the past few years with Dr. San Morelle he did not find clear-cut evidence of a fracture on imaging though keep in mind the patient's fracture happened 60+ years ago.  He also did not see anything in the sinuses to suggest fluid and on those prior exams.  I still think it certainly possible  he could have a CSF leak.  One of his predominant symptoms presently is headache and neck pain.  I am going to order an MRI with and without contrast of the brain and C-spine to look for potential brain abscess and for evidence of CSF leak.  Certainly GBS is NOT a common cause of meningitis in CSF leak--sinus pathogens such as H. influenza, pneumococcus would be more likely to be found---but he could have 2 separate problems and I have had adults with CNS GBS meningitis though they have typically had another source.  He will require sedation as he is a fair amount of claustrophobia when he has to have his head in MRI machine.  I will change antibiotics to ceftriaxone and metronidazole which will provide coverage for the group B streptococcus that he grew before with ample CNS penetration while avoiding excess fluid given to the patient as Dr. [name] attempts to diurese him more  IF CNS imaging does not show brain abscess or clear cut infection I would like to also look at the his feet again and obtain MRI of the left foot where he has the most dry  gangrene changes and MRI of the right ankle where he has an ulceration.   I think once he ultimately goes, will be helpful for him to have some amoxicillin or amoxicillin clavulanic acid on hand at home that he can start at the first sign of an infection.  My reasons for this are several fold.  Certainly if he has a CSF leak he will be prone to recurrent meningitis and would benefit from having an antibiotic that be active against pneumococcus and H influenza.  He is also already shown himself to have recurrent group B streptococcus bacteremia and amoxicillin or amoxicillin clavulanic acid would be a good antibiotic to initiate against such an organism.  I spent 134 minutes with the patient including than 50% of the time in face to face counseling of the patient and his wife regarding his sepsis without clear cause his recurrent group B streptococcus bacteremia, concern for possible CSF leak personally reviewing ET abdomen pelvis CT chest abdomen pelvis plain films of the foot MRI of the thoracic and lumbar spine prior head CT are MRI/MRA of the brain along with review of medical records in preparation for the visit and during the visit and in coordination of his care with Dr. Bonner Puna and Dr. Jobe Igo.  Review of Systems: Review of Systems  Constitutional:  Positive for chills, fever and malaise/fatigue. Negative for weight loss.  HENT:  Positive for congestion and ear discharge. Negative for sore throat.   Eyes:  Negative for blurred vision and photophobia.  Respiratory:  Negative for cough, shortness of breath and wheezing.   Cardiovascular:  Negative for chest pain, palpitations and leg swelling.  Gastrointestinal:  Positive for abdominal pain and diarrhea. Negative for blood in stool, constipation, heartburn, melena, nausea and vomiting.  Genitourinary:  Negative for dysuria, flank pain and hematuria.  Musculoskeletal:  Positive for neck pain. Negative for back pain, falls, joint pain and  myalgias.  Skin:  Negative for itching and rash.  Neurological:  Positive for headaches. Negative for dizziness, focal weakness, loss of consciousness and weakness.  Endo/Heme/Allergies:  Does not bruise/bleed easily.  Psychiatric/Behavioral:  Positive for memory loss. Negative for depression and suicidal ideas. The patient does not have insomnia.     Past Medical History:  Diagnosis Date   Anxiety    Arthritis    Atrial fibrillation ([place])  CAD (coronary artery disease)    a. 2010: DES to CTO of RCA. EF 55% b. 07/2016: cath showing total occlusion within previously placed RCA stent (collaterals present), severe stenosis along LCx and OM1 (treated with 2 overlapping DES). c. repeat cath in 01/2018 showing patent stents along LCx and OM with CTO of D2, CTO of distal LCx, and CTO of RCA with collaterals present overall unchanged since 2018 with medical management recom   Cellulitis and abscess rt groin    Complication of anesthesia    " I woke up during a colonoscopy "      Depression    Diabetes mellitus    Diastolic CHF ([place])    Disorders of iron metabolism    Dysrhythmia    Fibromyalgia    GERD (gastroesophageal reflux disease)    History of hiatal hernia    Hyperlipidemia    Hypertension    Low serum testosterone level    Medically noncompliant    Myocardial infarction ([place])    05-23-20   Pneumonia     Social History   Tobacco Use   Smoking status: Former    Packs/day: 0.25    Years: 51.00    Total pack years: 12.75    Types: Cigarettes    Quit date: 08/14/2016    Years since quitting: 5.4   Smokeless tobacco: Never   Tobacco comments:    smokes  a pack a week  Vaping Use   Vaping Use: Never used  Substance Use Topics   Alcohol use: No    Alcohol/week: 0.0 standard drinks of alcohol   Drug use: No    Family History  Problem Relation Age of Onset   Diabetes Father    Valvular heart disease Father    Arthritis Father    Heart disease Father    Stroke Father     Alzheimer's disease Mother    Hyperlipidemia Mother    Hypertension Mother    Arthritis Mother    Lung cancer Mother    Stroke Mother    Headache Mother    Arthritis/Rheumatoid Sister    Diabetes Sister    Hypertension Sister    Hyperlipidemia Sister    Depression Sister    Dementia Maternal Aunt    Dementia Maternal Uncle    Heart disease Maternal Uncle    Stomach cancer Paternal Uncle    Colon cancer Neg Hx    Liver disease Neg Hx    Allergies  Allergen Reactions   Shellfish Allergy Anaphylaxis, Hives and Swelling    Tongue swelling    Sulfa Antibiotics Anaphylaxis, Hives and Swelling    Tongue swelling   Ace Inhibitors Other (See Comments) and Cough    CKD, renal failure    Invokana [Canagliflozin] Other (See Comments)    Syncope Dehydration    Lexapro [Escitalopram] Other (See Comments)    Buzzing in ears Headaches "Felt like a zombie"   Metformin And Related Itching   Pravachol [Pravastatin Sodium] Other (See Comments)    Myalgias    Repatha [Evolocumab] Other (See Comments)    Myalgias Flu like symptoms    Tricor [Fenofibrate] Other (See Comments)    Myalgias     Zestril [Lisinopril] Cough   Crestor [Rosuvastatin] Other (See Comments)    Myalgias    Horse-Derived Products Rash    horse serum   Lipitor [Atorvastatin] Other (See Comments)    Myalgias    Livalo [Pitavastatin] Other (See Comments)    Myalgias  Milk ([place]) Diarrhea and Nausea Only    Stomach upset    Tape Rash    OBJECTIVE: Blood pressure 121/72, pulse 84, temperature (!) 97.5 F (36.4 C), temperature source Oral, resp. rate 15, height '6\' 2"'$  (1.88 m), weight 129.4 kg, SpO2 100 %.  Physical Exam Constitutional:      Appearance: He is well-developed.  HENT:     Head: Normocephalic and atraumatic.  Eyes:     Conjunctiva/sclera: Conjunctivae normal.  Cardiovascular:     Rate and Rhythm: Normal rate and regular rhythm.     Heart sounds: No murmur heard.    No friction rub.  No gallop.  Pulmonary:     Effort: Pulmonary effort is normal. No respiratory distress.     Breath sounds: No stridor. No wheezing or rhonchi.  Abdominal:     General: There is no distension.     Palpations: Abdomen is soft. There is no mass.     Tenderness: There is generalized abdominal tenderness. There is no guarding.  Musculoskeletal:        General: No tenderness. Normal range of motion.     Cervical back: Normal range of motion and neck supple.  Skin:    General: Skin is warm and dry.     Coloration: Skin is not pale.     Findings: No erythema or rash.  Neurological:     General: No focal deficit present.     Mental Status: He is alert and oriented to person, place, and time.  Psychiatric:        Mood and Affect: Mood normal.        Behavior: Behavior normal.        Thought Content: Thought content normal.        Cognition and Memory: He exhibits impaired recent memory.        Judgment: Judgment normal.    Legs        Feet:  Right ankle ulcer:     Right foot     Left foot       Lab Results Lab Results  Component Value Date   WBC 12.4 (H) 01/23/2022   HGB 9.8 (L) 01/23/2022   HCT 33.3 (L) 01/23/2022   MCV 80.2 01/23/2022   PLT 273 01/23/2022    Lab Results  Component Value Date   CREATININE 1.45 (H) 01/23/2022   BUN 29 (H) 01/23/2022   NA 135 01/23/2022   K 3.9 01/23/2022   CL 100 01/23/2022   CO2 22 01/23/2022    Lab Results  Component Value Date   ALT 670 (H) 01/23/2022   AST 647 (H) 01/23/2022   ALKPHOS 47 01/23/2022   BILITOT 2.2 (H) 01/23/2022     Microbiology: Recent Results (from the past 240 hour(s))  Blood Culture (routine x 2)     Status: None (Preliminary result)   Collection Time: 01/20/22  1:29 AM   Specimen: BLOOD  Result Value Ref Range Status   Specimen Description BLOOD RAC  Final   Special Requests   Final    BOTTLES DRAWN AEROBIC AND ANAEROBIC Blood Culture adequate volume   Culture   Final    NO GROWTH 2  DAYS Performed at [facility], [address], [place], [place] [address]    Report Status PENDING  Incomplete  Blood Culture (routine x 2)     Status: None (Preliminary result)   Collection Time: 01/20/22  1:29 AM   Specimen: BLOOD  Result Value Ref Range Status  Specimen Description BLOOD RFOA  Final   Special Requests   Final    BOTTLES DRAWN AEROBIC AND ANAEROBIC Blood Culture adequate volume   Culture   Final    NO GROWTH 2 DAYS Performed at [facility], [address], [place], [place] [address]    Report Status PENDING  Incomplete  SARS Coronavirus 2 by RT PCR (hospital order, performed in [facility] hospital lab) *cepheid single result test* Urine, Clean Catch     Status: None   Collection Time: 01/20/22  3:53 AM   Specimen: Urine, Clean Catch; Nasal Swab  Result Value Ref Range Status   SARS Coronavirus 2 by RT PCR NEGATIVE NEGATIVE Final    Comment: (NOTE) SARS-CoV-2 target nucleic acids are NOT DETECTED.  The SARS-CoV-2 RNA is generally detectable in upper and lower respiratory specimens during the acute phase of infection. The lowest concentration of SARS-CoV-2 viral copies this assay can detect is 250 copies / mL. A negative result does not preclude SARS-CoV-2 infection and should not be used as the sole basis for treatment or other patient management decisions.  A negative result may occur with improper specimen collection / handling, submission of specimen other than nasopharyngeal swab, presence of viral mutation(s) within the areas targeted by this assay, and inadequate number of viral copies (<250 copies / mL). A negative result must be combined with clinical observations, patient history, and epidemiological information.  Fact Sheet for Patients:   https://www.patel.info/  Fact Sheet for Healthcare Providers: https://hall.com/  This test is not yet approved or  cleared by the [place] FDA and has been  authorized for detection and/or diagnosis of SARS-CoV-2 by FDA under an Emergency Use Authorization (EUA).  This EUA will remain in effect (meaning this test can be used) for the duration of the COVID-19 declaration under Section 564(b)(1) of the Act, 21 U.S.C. section 360bbb-3(b)(1), unless the authorization is terminated or revoked sooner.  Performed at [facility], [address], [place], [place] [address]   Urine Culture     Status: Abnormal   Collection Time: 01/20/22  3:57 AM   Specimen: In/Out Cath Urine  Result Value Ref Range Status   Specimen Description   Final    IN/OUT CATH URINE Performed at [facility], [address] [place], [place] [address]    Special Requests   Final    NONE Performed at [facility], [address], [place], [place] [address]    Culture 3,000 COLONIES/mL ENTEROCOCCUS FAECALIS (A)  Final   Report Status 01/22/2022 FINAL  Final   Organism ID, Bacteria ENTEROCOCCUS FAECALIS (A)  Final      Susceptibility   Enterococcus faecalis - MIC*    AMPICILLIN <=2 SENSITIVE Sensitive     NITROFURANTOIN <=16 SENSITIVE Sensitive     VANCOMYCIN 2 SENSITIVE Sensitive     * 3,000 COLONIES/mL ENTEROCOCCUS FAECALIS  Blood culture (routine x 2)     Status: None (Preliminary result)   Collection Time: 01/21/22  9:30 PM   Specimen: BLOOD  Result Value Ref Range Status   Specimen Description BLOOD BLOOD RIGHT ARM  Final   Special Requests   Final    BOTTLES DRAWN AEROBIC AND ANAEROBIC Blood Culture adequate volume   Culture   Final    NO GROWTH 2 DAYS Performed at [facility], [place] [address], [place], [place] [address]    Report Status PENDING  Incomplete  Resp Panel by RT-PCR (Flu A&B, Covid) Anterior Nasal Swab  Status: None   Collection Time: 01/21/22 11:04 PM   Specimen: Anterior Nasal Swab  Result Value Ref Range Status   SARS Coronavirus 2 by RT PCR NEGATIVE NEGATIVE Final    Comment: (NOTE) SARS-CoV-2 target nucleic acids are NOT  DETECTED.  The SARS-CoV-2 RNA is generally detectable in upper respiratory specimens during the acute phase of infection. The lowest concentration of SARS-CoV-2 viral copies this assay can detect is 138 copies/mL. A negative result does not preclude SARS-Cov-2 infection and should not be used as the sole basis for treatment or other patient management decisions. A negative result may occur with  improper specimen collection/handling, submission of specimen other than nasopharyngeal swab, presence of viral mutation(s) within the areas targeted by this assay, and inadequate number of viral copies(<138 copies/mL). A negative result must be combined with clinical observations, patient history, and epidemiological information. The expected result is Negative.  Fact Sheet for Patients:  EntrepreneurPulse.com.au  Fact Sheet for Healthcare Providers:  IncredibleEmployment.be  This test is no t yet approved or cleared by the [place] FDA and  has been authorized for detection and/or diagnosis of SARS-CoV-2 by FDA under an Emergency Use Authorization (EUA). This EUA will remain  in effect (meaning this test can be used) for the duration of the COVID-19 declaration under Section 564(b)(1) of the Act, 21 U.S.C.section 360bbb-3(b)(1), unless the authorization is terminated  or revoked sooner.       Influenza A by PCR NEGATIVE NEGATIVE Final   Influenza B by PCR NEGATIVE NEGATIVE Final    Comment: (NOTE) The Xpert Xpress SARS-CoV-2/FLU/RSV plus assay is intended as an aid in the diagnosis of influenza from Nasopharyngeal swab specimens and should not be used as a sole basis for treatment. Nasal washings and aspirates are unacceptable for Xpert Xpress SARS-CoV-2/FLU/RSV testing.  Fact Sheet for Patients: EntrepreneurPulse.com.au  Fact Sheet for Healthcare Providers: IncredibleEmployment.be  This test is not yet  approved or cleared by the [place] FDA and has been authorized for detection and/or diagnosis of SARS-CoV-2 by FDA under an Emergency Use Authorization (EUA). This EUA will remain in effect (meaning this test can be used) for the duration of the COVID-19 declaration under Section 564(b)(1) of the Act, 21 U.S.C. section 360bbb-3(b)(1), unless the authorization is terminated or revoked.  Performed at [facility], [place] [address], [place], [place] [address]   MRSA Next Gen by PCR, Nasal     Status: None   Collection Time: 01/22/22  8:30 PM   Specimen: Nasal Mucosa; Nasal Swab  Result Value Ref Range Status   MRSA by PCR Next Gen NOT DETECTED NOT DETECTED Final    Comment: (NOTE) The GeneXpert MRSA Assay (FDA approved for NASAL specimens only), is one component of a comprehensive MRSA colonization surveillance program. It is not intended to diagnose MRSA infection nor to guide or monitor treatment for MRSA infections. Test performance is not FDA approved in patients less than 80 years old. Performed at [facility], [place] [address], [place], [place] [address]     Alcide Evener, [facility] [phone] pager  01/23/2022, 3:16 PM

## 2022-01-23 NOTE — Progress Notes (Signed)
ANTICOAGULATION CONSULT NOTE  Pharmacy Consult for Heparin Indication: atrial fibrillation  Allergies  Allergen Reactions   Shellfish Allergy Anaphylaxis, Hives and Swelling    Tongue swelling    Sulfa Antibiotics Anaphylaxis, Hives and Swelling    Tongue swelling   Ace Inhibitors Other (See Comments) and Cough    CKD, renal failure    Invokana [Canagliflozin] Other (See Comments)    Syncope Dehydration    Lexapro [Escitalopram] Other (See Comments)    Buzzing in ears Headaches "Felt like a zombie"   Metformin And Related Itching   Pravachol [Pravastatin Sodium] Other (See Comments)    Myalgias    Repatha [Evolocumab] Other (See Comments)    Myalgias Flu like symptoms    Tricor [Fenofibrate] Other (See Comments)    Myalgias     Zestril [Lisinopril] Cough   Crestor [Rosuvastatin] Other (See Comments)    Myalgias    Horse-Derived Products Rash    horse serum   Lipitor [Atorvastatin] Other (See Comments)    Myalgias    Livalo [Pitavastatin] Other (See Comments)    Myalgias    Milk (Cow) Diarrhea and Nausea Only    Stomach upset    Tape Rash    Patient Measurements: Height: '6\' 2"'$  (188 cm) Weight: 129.4 kg (285 lb 4.4 oz) IBW/kg (Calculated) : 82.2 Heparin Dosing Weight: 109 kg  Vital Signs: Temp: 97.7 F (36.5 C) (09/08 0725) Temp Source: Oral (09/08 0725) BP: 119/71 (09/08 0811) Pulse Rate: 88 (09/08 0725)  Labs: Recent Labs    01/21/22 1911 01/22/22 0450 01/22/22 1433 01/22/22 1439 01/22/22 2251 01/23/22 0732  HGB 11.5* 10.4*  --  11.6*  --  9.8*  HCT 38.2* 36.3*  --  34.0*  --  33.3*  PLT 348 303  --   --   --  273  APTT 50*  --   --   --  63* 60*  LABPROT 22.3* 24.9*  --   --   --   --   INR 2.0* 2.3*  --   --   --   --   HEPARINUNFRC  --   --   --   --  >1.10* >1.10*  CREATININE 1.91* 1.81* 1.68*  --   --  1.45*  CKTOTAL  --  180  --   --   --  174  TROPONINIHS  --   --  454*  --   --   --      Estimated Creatinine Clearance: 66.8  mL/min (A) (by C-G formula based on SCr of 1.45 mg/dL (H)).   Medical History: Past Medical History:  Diagnosis Date   Anxiety    Arthritis    Atrial fibrillation (HCC)    CAD (coronary artery disease)    a. 2010: DES to CTO of RCA. EF 55% b. 07/2016: cath showing total occlusion within previously placed RCA stent (collaterals present), severe stenosis along LCx and OM1 (treated with 2 overlapping DES). c. repeat cath in 01/2018 showing patent stents along LCx and OM with CTO of D2, CTO of distal LCx, and CTO of RCA with collaterals present overall unchanged since 2018 with medical management recom   Cellulitis and abscess rt groin    Complication of anesthesia    " I woke up during a colonoscopy "      Depression    Diabetes mellitus    Diastolic CHF ([place])    Disorders of iron metabolism    Dysrhythmia    Fibromyalgia  GERD (gastroesophageal reflux disease)    History of hiatal hernia    Hyperlipidemia    Hypertension    Low serum testosterone level    Medically noncompliant    Myocardial infarction [facility])    05-23-20   Pneumonia     Medications:  See electronic med rec  Assessment: 71 y.o. M known to pharmacy from abx dosing for sepsis. Pt on Eliquis PTA for afib. Last dose 9/6 a.m. Holding currently in case procedures are needed. To start heparin gtt.   Heparin level elevated as expected (>1.1) with recent DOAC, aPTT remain subtherapeutic at 60, on 1750 units/hr (levels not correlating yet). Hgb 9.8, plt 273 - CBC stable. No s/sx of bleeding or infusion issues.   Goal of Therapy:  Heparin level 0.3-0.7 units/ml, aPTT 66-102 sec Monitor platelets by anticoagulation protocol: Yes   Plan:  Increase heparin infusion at 1950 units/hr Will f/u aPTT in 8 hours Daily heparin level, aPTT, and CBC  Antonietta Jewel, PharmD, [place] Clinical Pharmacist  Phone: [phone] 01/23/2022 9:29 AM  Please check AMION for all [place] phone numbers After 10:00 PM, call [place]  [phone]

## 2022-01-23 NOTE — Progress Notes (Signed)
Pharmacy Antibiotic Note  [name] [name] is a 70 y.o. male for which pharmacy has been consulted for vancomycin and cefepime dosing for sepsis.  Patient with a history of Recurrent Group B streptococcus bacteremia possibly secondary to bilateral foot pressure ulcers and dry gangrene. Patient is seen by ID.  Day #3 of antibiotics - WBC trending down to 12, afebrile. Scr improved to 1.45 (CrCl 66 mL/min). MRSA PCR negative.   Plan: Change cefepime to 2g q8hr with improve in renal function Change vancomycin to 1500 mg q24hr (eAUC 468)  Continue metronidazole 500 mg q12 hr  F/u cultures, clinical course, renal fx De-escalate when able F/u ID recommendations  Height: '6\' 2"'$  (188 cm) Weight: 129.4 kg (285 lb 4.4 oz) IBW/kg (Calculated) : 82.2  Temp (24hrs), Avg:97.8 F (36.6 C), Min:97.6 F (36.4 C), Max:98 F (36.7 C)  Recent Labs  Lab 01/20/22 0129 01/20/22 0433 01/21/22 1911 01/21/22 2130 01/22/22 0241 01/22/22 0450 01/22/22 1433 01/23/22 0732  WBC 10.4  --  20.9*  --   --  19.0*  --  12.4*  CREATININE 1.57*  --  1.91*  --   --  1.81* 1.68* 1.45*  LATICACIDVEN 1.8   < > 3.0* 3.1* 3.0* 3.3* 2.1*  --    < > = values in this interval not displayed.     Estimated Creatinine Clearance: 66.8 mL/min (A) (by C-G formula based on SCr of 1.45 mg/dL (H)).    Allergies  Allergen Reactions   Shellfish Allergy Anaphylaxis, Hives and Swelling    Tongue swelling    Sulfa Antibiotics Anaphylaxis, Hives and Swelling    Tongue swelling   Ace Inhibitors Other (See Comments) and Cough    CKD, renal failure    Invokana [Canagliflozin] Other (See Comments)    Syncope Dehydration    Lexapro [Escitalopram] Other (See Comments)    Buzzing in ears Headaches "Felt like a zombie"   Metformin And Related Itching   Pravachol [Pravastatin Sodium] Other (See Comments)    Myalgias    Repatha [Evolocumab] Other (See Comments)    Myalgias Flu like symptoms    Tricor [Fenofibrate] Other (See  Comments)    Myalgias     Zestril [Lisinopril] Cough   Crestor [Rosuvastatin] Other (See Comments)    Myalgias    Horse-Derived Products Rash    horse serum   Lipitor [Atorvastatin] Other (See Comments)    Myalgias    Livalo [Pitavastatin] Other (See Comments)    Myalgias    Milk (Cow) Diarrhea and Nausea Only    Stomach upset    Tape Rash    Antimicrobials this admission: 9/6 Vanc >>[9/14] 9/6 Cefepime >>[9/14] 9/6 Flagyl >>[9/13]  Microbiology results: 9/5 Bcx: ngtd  9/5 Ucx: 30 k enterococcus faecalis (pan-sen) 9/6 Bcx: ngtd 9/7 MRSA PCR: neg  Thank you for allowing pharmacy to be a part of this patient's care.  Antonietta Jewel, PharmD, [place] Clinical Pharmacist  Phone: [phone] 01/23/2022 10:05 AM  Please check AMION for all [place] phone numbers After 10:00 PM, call [place] [phone]

## 2022-01-23 NOTE — Progress Notes (Signed)
ANTICOAGULATION CONSULT NOTE  Pharmacy Consult for Heparin Indication: atrial fibrillation  Allergies  Allergen Reactions   Shellfish Allergy Anaphylaxis, Hives and Swelling    Tongue swelling    Sulfa Antibiotics Anaphylaxis, Hives and Swelling    Tongue swelling   Ace Inhibitors Other (See Comments) and Cough    CKD, renal failure    Invokana [Canagliflozin] Other (See Comments)    Syncope Dehydration    Lexapro [Escitalopram] Other (See Comments)    Buzzing in ears Headaches "Felt like a zombie"   Metformin And Related Itching   Pravachol [Pravastatin Sodium] Other (See Comments)    Myalgias    Repatha [Evolocumab] Other (See Comments)    Myalgias Flu like symptoms    Tricor [Fenofibrate] Other (See Comments)    Myalgias     Zestril [Lisinopril] Cough   Crestor [Rosuvastatin] Other (See Comments)    Myalgias    Horse-Derived Products Rash    horse serum   Lipitor [Atorvastatin] Other (See Comments)    Myalgias    Livalo [Pitavastatin] Other (See Comments)    Myalgias    Milk (Cow) Diarrhea and Nausea Only    Stomach upset    Tape Rash    Patient Measurements: Height: '6\' 2"'$  (188 cm) Weight: 129.4 kg (285 lb 4.4 oz) IBW/kg (Calculated) : 82.2 Heparin Dosing Weight: 109 kg  Vital Signs: Temp: 97.6 F (36.4 C) (09/08 1636) Temp Source: Oral (09/08 1636) BP: 156/79 (09/08 1636) Pulse Rate: 84 (09/08 1636)  Labs: Recent Labs    01/21/22 1911 01/22/22 0450 01/22/22 1433 01/22/22 1439 01/22/22 2251 01/23/22 0732 01/23/22 1258 01/23/22 1753  HGB 11.5* 10.4*  --  11.6*  --  9.8*  --   --   HCT 38.2* 36.3*  --  34.0*  --  33.3*  --   --   PLT 348 303  --   --   --  273  --   --   APTT 50*  --   --   --  63* 60*  --  78*  LABPROT 22.3* 24.9*  --   --   --   --   --   --   INR 2.0* 2.3*  --   --   --   --   --   --   HEPARINUNFRC  --   --   --   --  >1.10* >1.10*  --   --   CREATININE 1.91* 1.81* 1.68*  --   --  1.45*  --   --   CKTOTAL  --  180   --   --   --  174  --   --   TROPONINIHS  --   --  454*  --   --   --  157*  --      Estimated Creatinine Clearance: 66.8 mL/min (A) (by C-G formula based on SCr of 1.45 mg/dL (H)).   Medical History: Past Medical History:  Diagnosis Date   Anxiety    Arthritis    Atrial fibrillation (HCC)    CAD (coronary artery disease)    a. 2010: DES to CTO of RCA. EF 55% b. 07/2016: cath showing total occlusion within previously placed RCA stent (collaterals present), severe stenosis along LCx and OM1 (treated with 2 overlapping DES). c. repeat cath in 01/2018 showing patent stents along LCx and OM with CTO of D2, CTO of distal LCx, and CTO of RCA with collaterals present overall unchanged since 2018 with  medical management recom   Cellulitis and abscess rt groin    Complication of anesthesia    " I woke up during a colonoscopy "      Depression    Diabetes mellitus    Diastolic CHF (HCC)    Disorders of iron metabolism    Dysrhythmia    Fibromyalgia    GERD (gastroesophageal reflux disease)    History of hiatal hernia    Hyperlipidemia    Hypertension    Low serum testosterone level    Medically noncompliant    Myocardial infarction [facility])    05-23-20   Pneumonia     Medications:  See electronic med rec  Assessment: 71 y.o. M known to pharmacy from abx dosing for sepsis. Pt on Eliquis PTA for afib. Last dose 9/6 a.m. Holding currently in case procedures are needed. To start heparin gtt.   Heparin level elevated as expected (>1.1) with recent DOAC, aPTT remain subtherapeutic at 60, on 1750 units/hr (levels not correlating yet). Hgb 9.8, plt 273 - CBC stable. No s/sx of bleeding or infusion issues.   PTT came back therapeutic this PM. We will continue with current rate and check confirm level in AM.  Goal of Therapy:  Heparin level 0.3-0.7 units/ml, aPTT 66-102 sec Monitor platelets by anticoagulation protocol: Yes   Plan:  Cont heparin infusion at 1950 units/hr Will f/u aPTT/HL  in AM Daily heparin level, aPTT, and CBC  Onnie Boer, PharmD, BCIDP, AAHIVP, CPP Infectious Disease Pharmacist 01/23/2022 6:57 PM

## 2022-01-23 NOTE — Consult Note (Addendum)
Cardiology Consultation   Patient ID: [name] [name] MRN: [id number]; DOB: 1950-08-14  Admit date: 01/21/2022 Date of Consult: 01/23/2022  PCP:  [name], [facility] Cardiologist:  Jenkins Rouge, MD  Electrophysiologist:  Cristopher Peru, MD  {  Patient Profile:   [name] [name] is a 71 y.o. male with a hx of CAD, paroxysmal atrial fibrillation, peripheral arterial disease followed by Dr. [name], hypertension, hyperlipidemia, HFpEF (chronic low normal EF), DM, anemia, esophageal dysmotility s/p dilatation, HLD (statin and Repatha intolerance) and recurrent bacteremia is being seen 01/23/2022 for the evaluation of Elevated troponin at the request of Dr. Bonner Puna.  History of CAD: - s/p DES to RCA in 2010 - Cath 2018: Status post overlapping DES to Lcx/OM1; CTO of RCA with collaterals. - Last cath 07/2021: Chronic RCA CTO with collaterals; patent circumflex/OM stent with chronic occlusion of mid circumflex with left to left collaterals.  PAD followed by vascular with hx of  left femoral to popliteal artery bypass. Most recently underwent stent of right tibioperoneal trunk and peroneal artery with 3.5 x 38 mm Synergy on 08/18/21.  Admitted 8/2-8/8 for recurrent group B strep bacteremia (initially treated in March 2023).  No vegetation on transthoracic and transesophageal echocardiogram.  History of Present Illness:   [name] [name] presented 01/21/2022 resented for lethargy and fatigue.  Also had abdominal pain with nausea.  LFTs were elevated and admitted for further evaluation of SIRS.  Initial concern for cholecystitis however reassuring RUQ abdominal ultrasound with cholelithiasis and mild gallbladder wall thickening.  Started on broad-spectrum antibiotic.  LFTs are improved today.  Also seen by GI.  Wants to rule out cardiac etiology of elevated LFTs.  Patient and wife are poor historian.  Patient denies any chest pain or shortness of breath.  Had a intermittent episode of  atrial fibrillation prior to admission requiring to take as needed metoprolol.  Wife reports that his left lower extremity ulcer has improved significantly.  No syncope or melena.  On heparin for anticoagulation.   Past Medical History:  Diagnosis Date   Anxiety    Arthritis    Atrial fibrillation (HCC)    CAD (coronary artery disease)    a. 2010: DES to CTO of RCA. EF 55% b. 07/2016: cath showing total occlusion within previously placed RCA stent (collaterals present), severe stenosis along LCx and OM1 (treated with 2 overlapping DES). c. repeat cath in 01/2018 showing patent stents along LCx and OM with CTO of D2, CTO of distal LCx, and CTO of RCA with collaterals present overall unchanged since 2018 with medical management recom   Cellulitis and abscess rt groin    Complication of anesthesia    " I woke up during a colonoscopy "      Depression    Diabetes mellitus    Diastolic CHF ([place])    Disorders of iron metabolism    Dysrhythmia    Fibromyalgia    GERD (gastroesophageal reflux disease)    History of hiatal hernia    Hyperlipidemia    Hypertension    Low serum testosterone level    Medically noncompliant    Myocardial infarction ([place])    05-23-20   Pneumonia     Past Surgical History:  Procedure Laterality Date   ABDOMINAL AORTOGRAM W/LOWER EXTREMITY N/A 03/21/2019   Procedure: ABDOMINAL AORTOGRAM W/LOWER EXTREMITY;  Surgeon: [name], MD;  Location: [place] [facility];  Service: Cardiovascular;  Laterality: N/A;   ABDOMINAL AORTOGRAM  W/LOWER EXTREMITY N/A 11/12/2020   Procedure: ABDOMINAL AORTOGRAM W/LOWER EXTREMITY;  Surgeon: [name], MD;  Location: [place] [facility];  Service: Cardiovascular;  Laterality: N/A;   ABDOMINAL AORTOGRAM W/LOWER EXTREMITY N/A 01/28/2021   Procedure: ABDOMINAL AORTOGRAM W/LOWER EXTREMITY;  Surgeon: [name], MD;  Location: [place] [facility];  Service: Cardiovascular;  Laterality: N/A;   ABDOMINAL AORTOGRAM W/LOWER  EXTREMITY Bilateral 07/29/2021   Procedure: ABDOMINAL AORTOGRAM W/LOWER EXTREMITY;  Surgeon: [name], MD;  Location: [place] [facility];  Service: Cardiovascular;  Laterality: Bilateral;   ANGIOPLASTY N/A 08/15/2021   Procedure: ATTEMPTED RIGHT PERONEAL  ANGIOPLASTY, LEFT PERONEAL ANGIOPLASTY;  Surgeon: [name], MD;  Location: [place];  Service: Vascular;  Laterality: N/A;   BACK SURGERY  2015   ACDF by Dr. Orland Jarred STUDY  12/23/2021   Procedure: BUBBLE STUDY;  Surgeon: Elouise Munroe, MD;  Location: [facility];  Service: Cardiology;;   COLONOSCOPY N/A 10/01/2014   Dr. [name]: multiple tubular adenomas removed, colonic diverticulosis, redundant colon. next tcs advised for 09/2017. PATIENT NEEDS PROPOFOL FOR FAILED CONSCIOUS SEDATION   CORONARY STENT INTERVENTION N/A 07/30/2016   Procedure: Coronary Stent Intervention;  Surgeon: [name], MD;  Location: [employer] [facility];  Service: Cardiovascular;  Laterality: N/A;   CORONARY STENT PLACEMENT  2000   By Dr. Olevia Perches   EP IMPLANTABLE DEVICE N/A 05/25/2016   Procedure: Loop Recorder Insertion;  Surgeon: [name], MD;  Location: [place] [facility];  Service: Cardiovascular;  Laterality: N/A;   ESOPHAGOGASTRODUODENOSCOPY     esophagus stretched remotely at [facility]   ESOPHAGOGASTRODUODENOSCOPY N/A 10/01/2014   Dr. [name]: patchy mottling/erythema and minimal polypoid appearance of gastric mucosa. bx with mild inlammation but no H.pylori   FEMORAL-POPLITEAL BYPASS GRAFT Left 11/22/2020   Procedure: LEFT FEMORAL-POPLITEAL BYPASS GRAFT;  Surgeon: [name], MD;  [facility]: [facility];  Service: Vascular;  Laterality: Left;   FEMORAL-POPLITEAL BYPASS GRAFT Left 08/15/2021   Procedure: REDO LEFT FEMORAL-POPLITEAL BYPASS USING PROPATEN GRAFT;  Surgeon: [name], MD;  Location: [place];  Service: Vascular;  Laterality: Left;   HERNIA REPAIR  5009   umbilical   INSERTION OF ILIAC STENT Right 11/22/2020   Procedure: INSERTION OF  ELUVIA STENT INTO RIGHT DISTAL SUPERFICIAL FEMORAL ARTERY;  Surgeon: [name], MD;  Location: [place];  Service: Vascular;  Laterality: Right;   LEFT HEART CATH AND CORONARY ANGIOGRAPHY N/A 07/30/2016   Procedure: Left Heart Cath and Coronary Angiography;  Surgeon: [name], MD;  Location: [place] [facility];  Service: Cardiovascular;  Laterality: N/A;   LEFT HEART CATH AND CORONARY ANGIOGRAPHY N/A 01/19/2018   Procedure: LEFT HEART CATH AND CORONARY ANGIOGRAPHY;  Surgeon: [name], MD;  Location: [place] [facility];  Service: Cardiovascular;  Laterality: N/A;   LEFT HEART CATH AND CORONARY ANGIOGRAPHY N/A 05/24/2020   Procedure: LEFT HEART CATH AND CORONARY ANGIOGRAPHY;  Surgeon: Burnell Blanks, MD;  Location: [place] [facility];  Service: Cardiovascular;  Laterality: N/A;   LEFT HEART CATH AND CORONARY ANGIOGRAPHY N/A 08/06/2021   Procedure: LEFT HEART CATH AND CORONARY ANGIOGRAPHY;  Surgeon: Burnell Blanks, MD;  Location: [place] [facility];  Service: Cardiovascular;  Laterality: N/A;   LESION REMOVAL     Lip and hand    LOWER EXTREMITY ANGIOGRAM Right 11/22/2020   Procedure: RIGHT LEG ANGIOGRAM;  Surgeon: [name], MD;  Location: [place];  Service: Vascular;  Laterality:  Right;   LOWER EXTREMITY ANGIOGRAM Right 08/15/2021   Procedure: RIGHT LOWER EXTREMITY ANGIOGRAM;  Surgeon: [name], MD;  Location: [facility];  Service: Vascular;  Laterality: Right;   LOWER EXTREMITY ANGIOGRAPHY N/A 04/18/2019   Procedure: LOWER EXTREMITY ANGIOGRAPHY;  Surgeon: [name], MD;  Location: [place] [facility];  Service: Cardiovascular;  Laterality: N/A;   LOWER EXTREMITY ANGIOGRAPHY N/A 08/18/2021   Procedure: Lower Extremity Angiography;  Surgeon: Waynetta Sandy, MD;  Location: [place] [facility];  Service: Cardiovascular;  Laterality: N/A;   NECK SURGERY     PERIPHERAL VASCULAR BALLOON ANGIOPLASTY  04/18/2019   Procedure: PERIPHERAL VASCULAR BALLOON  ANGIOPLASTY;  Surgeon: [name], MD;  Location: [place] [facility];  Service: Cardiovascular;;   PERIPHERAL VASCULAR BALLOON ANGIOPLASTY Left 11/12/2020   Procedure: PERIPHERAL VASCULAR BALLOON ANGIOPLASTY;  Surgeon: [name], MD;  Location: [place] [facility];  Service: Cardiovascular;  Laterality: Left;  Failed PTA of superficial femoral artery.   PERIPHERAL VASCULAR BALLOON ANGIOPLASTY Right 08/18/2021   Procedure: PERIPHERAL VASCULAR BALLOON ANGIOPLASTY;  Surgeon: Waynetta Sandy, MD;  Location: [place] [facility];  Service: Cardiovascular;  Laterality: Right;  peroneal   PERIPHERAL VASCULAR INTERVENTION Right 03/21/2019   Procedure: PERIPHERAL VASCULAR INTERVENTION;  Surgeon: [name], MD;  Location: [place] [facility];  Service: Cardiovascular;  Laterality: Right;  superficial femoral   PERIPHERAL VASCULAR INTERVENTION Right 08/18/2021   Procedure: PERIPHERAL VASCULAR INTERVENTION;  Surgeon: Waynetta Sandy, MD;  Location: [place] [facility];  Service: Cardiovascular;  Laterality: Right;  tibial peroneal trunk   TEE WITHOUT CARDIOVERSION N/A 12/23/2021   Procedure: TRANSESOPHAGEAL ECHOCARDIOGRAM (TEE);  Surgeon: Elouise Munroe, MD;  Location: [place];  Service: Cardiology;  Laterality: N/A;     Inpatient Medications: Scheduled Meds:  clopidogrel  75 mg Oral Daily   DULoxetine  90 mg Oral Daily   ezetimibe  10 mg Oral Daily   furosemide  40 mg Intravenous Once   gabapentin  300 mg Oral TID   insulin aspart  0-20 Units Subcutaneous TID WC   insulin aspart  0-5 Units Subcutaneous QHS   isosorbide mononitrate  30 mg Oral Daily   metoprolol succinate  50 mg Oral Daily   ondansetron (ZOFRAN) IV  4 mg Intravenous Q6H   pantoprazole (PROTONIX) IV  40 mg Intravenous Q12H   phosphorus  250 mg Oral TID   Continuous Infusions:  ceFEPime (MAXIPIME) IV     heparin 1,950 Units/hr (01/23/22 0953)   metronidazole 500 mg (01/23/22 0947)   [START ON  01/24/2022] vancomycin     PRN Meds: acetaminophen **OR** acetaminophen, busPIRone, nitroGLYCERIN  Allergies:    Allergies  Allergen Reactions   Shellfish Allergy Anaphylaxis, Hives and Swelling    Tongue swelling    Sulfa Antibiotics Anaphylaxis, Hives and Swelling    Tongue swelling   Ace Inhibitors Other (See Comments) and Cough    CKD, renal failure    Invokana [Canagliflozin] Other (See Comments)    Syncope Dehydration    Lexapro [Escitalopram] Other (See Comments)    Buzzing in ears Headaches "Felt like a zombie"   Metformin And Related Itching   Pravachol [Pravastatin Sodium] Other (See Comments)    Myalgias    Repatha [Evolocumab] Other (See Comments)    Myalgias Flu like symptoms    Tricor [Fenofibrate] Other (See Comments)    Myalgias     Zestril [Lisinopril] Cough   Crestor [Rosuvastatin] Other (See Comments)  Myalgias    Horse-Derived Products Rash    horse serum   Lipitor [Atorvastatin] Other (See Comments)    Myalgias    Livalo [Pitavastatin] Other (See Comments)    Myalgias    Milk (Cow) Diarrhea and Nausea Only    Stomach upset    Tape Rash    Social History:   Social History   Socioeconomic History   Marital status: Married    Spouse name: Belenda Cruise   Number of children: 2   Years of education: 12   Highest education level: 12th grade  Occupational History   Occupation: retired  Tobacco Use   Smoking status: Former    Packs/day: 0.25    Years: 51.00    Total pack years: 12.75    Types: Cigarettes    Quit date: 08/14/2016    Years since quitting: 5.4   Smokeless tobacco: Never   Tobacco comments:    smokes  a pack a week  Vaping Use   Vaping Use: Never used  Substance and Sexual Activity   Alcohol use: No    Alcohol/week: 0.0 standard drinks of alcohol   Drug use: No   Sexual activity: Yes  Other Topics Concern   Not on file  Social History Narrative   Lives with his wife.  Retired.  Unable to afford expensive medicines.      He has 2 children from previous marriage - they live in [place]       Caffeine: 1 cup of 1/2 caff coffee in AM, drinks more if at a restaurant    Social Determinants of Health   Financial Resource Strain: Medium Risk (01/06/2021)   Overall Financial Resource Strain (CARDIA)    Difficulty of Paying Living Expenses: Somewhat hard  Food Insecurity: No Food Insecurity (01/06/2021)   Hunger Vital Sign    Worried About Running Out of Food in the Last Year: Never true    [place] in the Last Year: Never true  Transportation Needs: No Transportation Needs (01/06/2021)   PRAPARE - Hydrologist (Medical): No    Lack of Transportation (Non-Medical): No  Physical Activity: Insufficiently Active (01/06/2021)   Exercise Vital Sign    Days of Exercise per Week: 7 days    Minutes of Exercise per Session: 10 min  Stress: No Stress Concern Present (01/06/2021)   [place]    Feeling of Stress : Only a little  Social Connections: Moderately Isolated (01/06/2021)   Social Connection and Isolation Panel [NHANES]    Frequency of Communication with Friends and Family: More than three times a week    Frequency of Social Gatherings with Friends and Family: Twice a week    Attends Religious Services: Never    Marine scientist or Organizations: No    Attends Archivist Meetings: Never    Marital Status: Married  Human resources officer Violence: Not At Risk (01/06/2021)   Humiliation, Afraid, Rape, and Kick questionnaire    Fear of Current or Ex-Partner: No    Emotionally Abused: No    Physically Abused: No    Sexually Abused: No    Family History:   Family History  Problem Relation Age of Onset   Diabetes Father    Valvular heart disease Father    Arthritis Father    Heart disease Father    Stroke Father    Alzheimer's disease Mother    Hyperlipidemia Mother  Hypertension Mother     Arthritis Mother    Lung cancer Mother    Stroke Mother    Headache Mother    Arthritis/Rheumatoid Sister    Diabetes Sister    Hypertension Sister    Hyperlipidemia Sister    Depression Sister    Dementia Maternal Aunt    Dementia Maternal Uncle    Heart disease Maternal Uncle    Stomach cancer Paternal Uncle    Colon cancer Neg Hx    Liver disease Neg Hx      ROS:  Please see the history of present illness.  All other ROS reviewed and negative.     Physical Exam/Data:   Vitals:   01/23/22 0408 01/23/22 0725 01/23/22 0811 01/23/22 1048  BP: 119/78 129/89 119/71 121/72  Pulse: 79 88  84  Resp: '15 18  15  '$ Temp: 98 F (36.7 C) 97.7 F (36.5 C)  (!) 97.5 F (36.4 C)  TempSrc: Oral Oral  Oral  SpO2: 96% 99%  100%  Weight:      Height:        Intake/Output Summary (Last 24 hours) at 01/23/2022 1414 Last data filed at 01/23/2022 1051 Gross per 24 hour  Intake 1026.4 ml  Output 2000 ml  Net -973.6 ml      01/22/2022    8:31 PM 01/21/2022    6:40 PM 01/09/2022    3:15 PM  Last 3 Weights  Weight (lbs) 285 lb 4.4 oz 268 lb 15.4 oz 270 lb  Weight (kg) 129.4 kg 122 kg 122.471 kg     Body mass index is 36.63 kg/m.  General:  ill appearing elderly male in no acute distress HEENT: normal Neck: no JVD Vascular: No carotid bruits; Distal pulses 2+ bilaterally Cardiac:  normal S1, S2; irregularly irregular; no murmur  Lungs:   Diminished breath sounds  diminished breath sounds abd: soft, nontender, no hepatomegaly  Ext: gangrene/ulcer in lower extremity   Musculoskeletal:   BUE and BLE strength normal and equal Skin: warm and dry  Neuro:  CNs 2-12 intact, no focal abnormalities noted Psych:  Normal affect   EKG:  The EKG was personally reviewed and demonstrates: Atrial fibrillation, nonspecific ST/T wave changes inferior laterally Telemetry:  Telemetry was personally reviewed and demonstrates: Atrial fibrillation with controlled ventricular rate  Relevant CV  Studies:  Echo 12/19/2021: 1. Left ventricular ejection fraction, by estimation, is 45 to 50%. The  left ventricle has mildly decreased function. The left ventricle  demonstrates global hypokinesis. The left ventricular internal cavity size  was mildly dilated. Left ventricular  diastolic parameters are indeterminate.   2. Right ventricular systolic function is normal. The right ventricular  size is normal. There is normal pulmonary artery systolic pressure. The  estimated right ventricular systolic pressure is 63.0 mmHg.   3. The mitral valve is normal in structure. Mild mitral valve  regurgitation. No evidence of mitral stenosis.   4. The aortic valve is tricuspid. There is mild calcification of the  aortic valve. Aortic valve regurgitation is trivial.   5. The inferior vena cava is normal in size with greater than 50%  respiratory variability, suggesting right atrial pressure of 3 mmHg.   LEFT HEART CATH AND CORONARY ANGIOGRAPHY  07/2021   Conclusion      Prox LAD lesion is 40% stenosed.   Mid Cx to Dist Cx lesion is 100% stenosed.   Prox RCA to Mid RCA lesion is 90% stenosed.   Prox RCA lesion is  99% stenosed.   Ost 1st Diag lesion is 40% stenosed.   Ost 2nd Diag to 2nd Diag lesion is 100% stenosed.   Previously placed Ost 1st Mrg stent (unknown type) is  widely patent.   Previously placed Prox Cx stent (unknown type) is  widely patent.   1. Moderate mid LAD stenosis unchanged. Chronic occlusion Diagonal 1. Severe disease small diagonal 2.  2. Patent Circumflex/OM stents. Chronic occlusion of the mid Circumflex beyond the takeoff of the obtuse marginal branch. This branch fills from left to left collaterals.  3. Chronic occlusion mid RCA in old stented segment. The distal vessel fills briskly from left to right collaterals.  4. LVEDP=28 mmHg   Recommendations: Continue medical management of CAD. Ok to proceed with planned vascular surgery. Resume Eliquis tomorrow. I will ask him  to begin lasix 40 mg daily.   Diagnostic Dominance: Right   Laboratory Data:  High Sensitivity Troponin:   Recent Labs  Lab 01/20/22 0129 01/20/22 0433 01/22/22 1433  TROPONINIHS 42* 75* 454*     Chemistry Recent Labs  Lab 01/22/22 0450 01/22/22 1433 01/22/22 1439 01/23/22 0732  NA 133* 135 135 135  K 4.3 4.2 4.1 3.9  CL 98 101  --  100  CO2 21* 22  --  22  GLUCOSE 173* 155*  --  132*  BUN 30* 29*  --  29*  CREATININE 1.81* 1.68*  --  1.45*  CALCIUM 8.9 8.6*  --  9.0  MG  --   --   --  2.2  GFRNONAA 39* 43*  --  52*  ANIONGAP 14 12  --  13    Recent Labs  Lab 01/22/22 0450 01/22/22 1433 01/23/22 0732  PROT 7.3 7.0 7.2  ALBUMIN 3.4* 3.2* 3.2*  AST 522* 1,516* 647*  ALT 318* 720* 670*  ALKPHOS 48 44 47  BILITOT 2.3* 1.8* 2.2*   Lipids  Recent Labs  Lab 01/23/22 0732  CHOL 127  TRIG 199*  HDL 11*  LDLCALC 76  CHOLHDL 11.5    Hematology Recent Labs  Lab 01/21/22 1911 01/22/22 0450 01/22/22 1439 01/23/22 0732  WBC 20.9* 19.0*  --  12.4*  RBC 4.92 4.51  --  4.15*  HGB 11.5* 10.4* 11.6* 9.8*  HCT 38.2* 36.3* 34.0* 33.3*  MCV 77.6* 80.5  --  80.2  MCH 23.4* 23.1*  --  23.6*  MCHC 30.1 28.7*  --  29.4*  RDW 16.3* 16.3*  --  16.4*  PLT 348 303  --  273   Thyroid No results for input(s): "TSH", "FREET4" in the last 168 hours.  BNP Recent Labs  Lab 01/20/22 0129 01/22/22 1433  BNP 267.0* 604.7*    DDimer No results for input(s): "DDIMER" in the last 168 hours.   Radiology/Studies:  NM Hepatobiliary Liver Func  Result Date: 01/22/2022 CLINICAL DATA:  Abdominal pain.  Biliary obstruction suspected EXAM: NUCLEAR MEDICINE HEPATOBILIARY IMAGING TECHNIQUE: Sequential images of the abdomen were obtained out to 60 minutes following intravenous administration of radiopharmaceutical. RADIOPHARMACEUTICALS:  7.6 mCi Tc-35m Choletec IV COMPARISON:  01/21/2022 FINDINGS: Prompt uptake and biliary excretion of activity by the liver is seen. Gallbladder  activity is visualized, consistent with patency of cystic duct. Biliary activity passes into small bowel, consistent with patent common bile duct. IMPRESSION: Normal scintigraphic hepatobiliary scan without evidence of biliary obstruction. Electronically Signed   By: [name]   On: 01/22/2022 14:21   UKoreaAbdomen Limited RUQ (LIVER/GB)  Result Date:  01/21/2022 CLINICAL DATA:  Abdominal pain. EXAM: ULTRASOUND ABDOMEN LIMITED RIGHT UPPER QUADRANT COMPARISON:  None Available. FINDINGS: Gallbladder: Numerous ill-defined shadowing echogenic gallstones are seen within the gallbladder lumen. The gallbladder wall measures 4.01 mm in thickness. No sonographic Murphy sign noted by sonographer. Common bile duct: Diameter: 6.5 mm Liver: No focal lesion identified. Within normal limits in parenchymal echogenicity. Portal vein is patent on color Doppler imaging with normal direction of blood flow towards the liver. Other: None. IMPRESSION: Cholelithiasis and mild gallbladder wall thickening, without evidence of acute cholecystitis. Electronically Signed   By: Virgina Norfolk M.D.   On: 01/21/2022 22:51   CT ABDOMEN PELVIS W CONTRAST  Result Date: 01/21/2022 CLINICAL DATA:  Acute abdominal pain. EXAM: CT ABDOMEN AND PELVIS WITH CONTRAST TECHNIQUE: Multidetector CT imaging of the abdomen and pelvis was performed using the standard protocol following bolus administration of intravenous contrast. RADIATION DOSE REDUCTION: This exam was performed according to the departmental dose-optimization program which includes automated exposure control, adjustment of the mA and/or kV according to patient size and/or use of iterative reconstruction technique. CONTRAST:  6m OMNIPAQUE IOHEXOL 300 MG/ML  SOLN COMPARISON:  CT 12/17/2021 FINDINGS: Lower chest: Trace right pleural effusion is new from prior exam. Breathing motion artifact limits detailed assessment. Heart is upper normal in size. There are coronary artery  calcifications. Hepatobiliary: The liver is enlarged spanning 22.9 cm cranial caudal with diffuse steatosis. No evidence of focal liver lesion. There is layering hyperdensity in the gallbladder which may represent stones/sludge. Question of mild pericholecystic fat stranding, not well assessed due to motion. No biliary dilatation. Small volume of perihepatic ascites. Pancreas: Fatty atrophy.  No ductal dilatation or inflammation. Spleen: Normal in size. No obvious focal abnormality, motion limited evaluation. Adrenals/Urinary Tract: Normal adrenal glands. No hydronephrosis. No renal calculi. Mild symmetric perinephric edema, also seen on prior no suspicious renal lesion. Unremarkable urinary bladder. Stomach/Bowel: Motion artifact through the upper abdomen limits bowel assessment. No inflammation of the stomach or duodenum. There is no small bowel obstruction or small bowel inflammation. Normal appendix visualized. Small volume of colonic stool. Sigmoid colon is redundant. Vascular/Lymphatic: Aortic atherosclerosis. Calcified noncalcified atheromatous plaque. No aneurysm. Prominent periportal node measuring 12 mm, likely reactive. No suspicious adenopathy. Reproductive: Prostate is unremarkable. Other: Small amount of perihepatic ascites. Minimal free fluid in the right pericolic gutter which tracks into the pelvis. No free air. Fat within both inguinal canals, right greater than left. Musculoskeletal: Stable degenerative change in the spine. There are no acute or suspicious osseous abnormalities. IMPRESSION: 1. Layering hyperdensity in the gallbladder may represent stones/sludge. Question of mild pericholecystic fat stranding, not well assessed due to motion artifact. Right upper quadrant ultrasound is planned. 2. Hepatomegaly and hepatic steatosis. Small volume of perihepatic and right pericolic gutter ascites. 3. Trace right pleural effusion, new from prior exam. Aortic Atherosclerosis (ICD10-I70.0).  Electronically Signed   By: MKeith RakeM.D.   On: 01/21/2022 22:27   DG Chest 2 View  Result Date: 01/21/2022 CLINICAL DATA:  Crackles in chest EXAM: CHEST - 2 VIEW COMPARISON:  01/20/2022, 12/17/2021 FINDINGS: Electronic device over left chest. Cardiomegaly with mild central congestion. No focal airspace disease, pleural effusion, or pneumothorax. Aortic atherosclerosis. IMPRESSION: Cardiomegaly with mild central congestion Electronically Signed   By: KDonavan FoilM.D.   On: 01/21/2022 20:38   DG Chest Port 1 View  Result Date: 01/20/2022 CLINICAL DATA:  Questionable sepsis - evaluate for abnormality EXAM: PORTABLE CHEST 1 VIEW COMPARISON:  CT chest 12/17/2021, chest  x-[place] 12/17/2021 FINDINGS: The heart and mediastinal contours are unchanged. Aortic calcification. Improved aeration of the left base. No focal consolidation. No pulmonary edema. No pleural effusion. No pneumothorax. No acute osseous abnormality. IMPRESSION: No active disease. Electronically Signed   By: Iven Finn M.D.   On: 01/20/2022 01:11     Assessment and Plan:   Elevated  troponin -Hs-troponin 42>>75>>454>> pending reading today -No chest pain -EKG with improved nonspecific ST/T wave changes -Suspect demand ischemia with underlying sepsis, bacteremia and coronary artery disease -Does not warrant any inpatient ischemic evaluation  2.  CAD -Last cath March 2023 as above, stable dx >> medical management  -Continue Plavix -Continue Zetia -Continue beta-blocker and Imdur  3.  Paroxysmal atrial fibrillation - In rate controlled atrial fibrillation since admit.  Had A-fib prior to admission.  -On heparin for anticoagulation (Eliquis on hold) -Continue Toprol-XL -Previously did not tolerated amiodarone  4.  HFpEF - Last echo 12/2021 with LVEF of 45-50% - Hydrated for sepsis during admission. BNP 267 at admit to 604 yesterday.  - Agree with IV lasix '40mg'$  x 1  5. SIRS 6. Bacteremia - Per primary   7.  Elevated LFTS - Now trending down - GI on board -I does not suspect his elevated troponin is due to primary cardiac event  8. PVD - Followed by vascular   9.  Acute on chronic disease stage III -Renal function is improving  Risk Assessment/Risk Scores:        New York Heart Association (NYHA) Functional Class NYHA Class II  CHA2DS2-VASc Score = 5   This indicates a 7.2% annual risk of stroke. The patient's score is based upon: CHF History: 1 HTN History: 1 Diabetes History: 1 Stroke History: 0 Vascular Disease History: 1 Age Score: 1 Gender Score: 0     For questions or updates, please contact [place] Please consult [email or web address] for contact info under    Jarrett Soho, [place]  01/23/2022 2:14 PM   Patient examined chart reviewed Discussed care with ID, patient and wife. 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 Had cath in March to make sure he was ok for PV limb salvage intervention with VVS Dr [name]. Chronic occlusion distal RCA and circ with collaterals and occluded D2 Nothing to intevene on Minimal troponin elevation with no acute ECG changes or chest pain in setting of sepsis and volume resuscitation  Continue anti anginal meds Lasix as needed to get rid of excess volume No indication for cath. ID seeing as source of infection not obvious ? GB vs Urine or other EF 40-45% and not low output His heart does not explain elevated LFTls  Continue plavix and beta blocker Eliquis held and on heparin   Cardiology will follow this weekend  Jenkins Rouge MD [facility]

## 2022-01-23 NOTE — Progress Notes (Signed)
Ok to change schedule Zofran to Tigan '200mg'$  IM q8 due to Qtc prolongation with Zofran per Dr. Silverio Decamp.  Onnie Boer, PharmD, BCIDP, AAHIVP, CPP Infectious Disease Pharmacist 01/23/2022 3:38 PM

## 2022-01-24 ENCOUNTER — Other Ambulatory Visit: Payer: Self-pay

## 2022-01-24 ENCOUNTER — Inpatient Hospital Stay (HOSPITAL_COMMUNITY): Payer: Medicare Other

## 2022-01-24 DIAGNOSIS — I5020 Unspecified systolic (congestive) heart failure: Secondary | ICD-10-CM | POA: Diagnosis not present

## 2022-01-24 DIAGNOSIS — F419 Anxiety disorder, unspecified: Secondary | ICD-10-CM

## 2022-01-24 DIAGNOSIS — I25118 Atherosclerotic heart disease of native coronary artery with other forms of angina pectoris: Secondary | ICD-10-CM

## 2022-01-24 DIAGNOSIS — R531 Weakness: Secondary | ICD-10-CM

## 2022-01-24 DIAGNOSIS — K810 Acute cholecystitis: Secondary | ICD-10-CM | POA: Diagnosis not present

## 2022-01-24 DIAGNOSIS — E669 Obesity, unspecified: Secondary | ICD-10-CM

## 2022-01-24 DIAGNOSIS — I480 Paroxysmal atrial fibrillation: Secondary | ICD-10-CM

## 2022-01-24 DIAGNOSIS — E872 Acidosis, unspecified: Secondary | ICD-10-CM

## 2022-01-24 DIAGNOSIS — R6510 Systemic inflammatory response syndrome (SIRS) of non-infectious origin without acute organ dysfunction: Secondary | ICD-10-CM

## 2022-01-24 DIAGNOSIS — D500 Iron deficiency anemia secondary to blood loss (chronic): Secondary | ICD-10-CM

## 2022-01-24 DIAGNOSIS — Z87891 Personal history of nicotine dependence: Secondary | ICD-10-CM

## 2022-01-24 DIAGNOSIS — I779 Disorder of arteries and arterioles, unspecified: Secondary | ICD-10-CM

## 2022-01-24 DIAGNOSIS — R7989 Other specified abnormal findings of blood chemistry: Secondary | ICD-10-CM

## 2022-01-24 DIAGNOSIS — R7881 Bacteremia: Secondary | ICD-10-CM

## 2022-01-24 DIAGNOSIS — I96 Gangrene, not elsewhere classified: Secondary | ICD-10-CM

## 2022-01-24 DIAGNOSIS — Z9861 Coronary angioplasty status: Secondary | ICD-10-CM

## 2022-01-24 DIAGNOSIS — N179 Acute kidney failure, unspecified: Secondary | ICD-10-CM | POA: Diagnosis not present

## 2022-01-24 DIAGNOSIS — R778 Other specified abnormalities of plasma proteins: Secondary | ICD-10-CM

## 2022-01-24 DIAGNOSIS — I5040 Unspecified combined systolic (congestive) and diastolic (congestive) heart failure: Secondary | ICD-10-CM

## 2022-01-24 DIAGNOSIS — K8050 Calculus of bile duct without cholangitis or cholecystitis without obstruction: Secondary | ICD-10-CM | POA: Diagnosis not present

## 2022-01-24 DIAGNOSIS — I2510 Atherosclerotic heart disease of native coronary artery without angina pectoris: Secondary | ICD-10-CM | POA: Diagnosis not present

## 2022-01-24 LAB — HEPARIN LEVEL (UNFRACTIONATED): Heparin Unfractionated: >1.1 IU/mL — ABNORMAL HIGH (ref 0.30–0.70)

## 2022-01-24 LAB — COMPREHENSIVE METABOLIC PANEL
ALT: 607 U/L — ABNORMAL HIGH (ref 0–44)
AST: 454 U/L — ABNORMAL HIGH (ref 15–41)
Albumin: 3.1 g/dL — ABNORMAL LOW (ref 3.5–5.0)
Alkaline Phosphatase: 51 U/L (ref 38–126)
Anion gap: 14 (ref 5–15)
BUN: 26 mg/dL — ABNORMAL HIGH (ref 8–23)
CO2: 23 mmol/L (ref 22–32)
Calcium: 8.6 mg/dL — ABNORMAL LOW (ref 8.9–10.3)
Chloride: 97 mmol/L — ABNORMAL LOW (ref 98–111)
Creatinine, Ser: 1.3 mg/dL — ABNORMAL HIGH (ref 0.61–1.24)
GFR, Estimated: 59 mL/min — ABNORMAL LOW (ref >60)
Glucose, Bld: 204 mg/dL — ABNORMAL HIGH (ref 70–99)
Potassium: 3.2 mmol/L — ABNORMAL LOW (ref 3.5–5.1)
Sodium: 134 mmol/L — ABNORMAL LOW (ref 135–145)
Total Bilirubin: 1.3 mg/dL — ABNORMAL HIGH (ref 0.3–1.2)
Total Protein: 7.1 g/dL (ref 6.5–8.1)

## 2022-01-24 LAB — CBC
HCT: 32.2 % — ABNORMAL LOW (ref 39.0–52.0)
Hemoglobin: 9.5 g/dL — ABNORMAL LOW (ref 13.0–17.0)
MCH: 23.4 pg — ABNORMAL LOW (ref 26.0–34.0)
MCHC: 29.5 g/dL — ABNORMAL LOW (ref 30.0–36.0)
MCV: 79.3 fL — ABNORMAL LOW (ref 80.0–100.0)
Platelets: 302 10*3/uL (ref 150–400)
RBC: 4.06 MIL/uL — ABNORMAL LOW (ref 4.22–5.81)
RDW: 16.5 % — ABNORMAL HIGH (ref 11.5–15.5)
WBC: 10.2 10*3/uL (ref 4.0–10.5)
nRBC: 0 % (ref 0.0–0.2)

## 2022-01-24 LAB — GLUCOSE, CAPILLARY
Glucose-Capillary: 217 mg/dL — ABNORMAL HIGH (ref 70–99)
Glucose-Capillary: 232 mg/dL — ABNORMAL HIGH (ref 70–99)
Glucose-Capillary: 264 mg/dL — ABNORMAL HIGH (ref 70–99)
Glucose-Capillary: 227 mg/dL — ABNORMAL HIGH (ref 70–99)

## 2022-01-24 LAB — MAGNESIUM: Magnesium: 2.2 mg/dL (ref 1.7–2.4)

## 2022-01-24 LAB — APTT: aPTT: 73 seconds — ABNORMAL HIGH (ref 24–36)

## 2022-01-24 LAB — PHOSPHORUS: Phosphorus: 2.1 mg/dL — ABNORMAL LOW (ref 2.5–4.6)

## 2022-01-24 MED ORDER — INSULIN DETEMIR 100 UNIT/ML ~~LOC~~ SOLN
10.0000 [IU] | Freq: Two times a day (BID) | SUBCUTANEOUS | Status: DC
Start: 2022-01-24 — End: 2022-01-25
  Administered 2022-01-24 (×2): 10 [IU] via SUBCUTANEOUS
  Filled 2022-01-24 (×5): qty 0.1

## 2022-01-24 MED ORDER — HYDROCODONE-ACETAMINOPHEN 10-325 MG PO TABS
1.0000 | ORAL_TABLET | Freq: Two times a day (BID) | ORAL | Status: DC | PRN
  Administered 2022-01-24 – 2022-01-25 (×3): 1 via ORAL
  Filled 2022-01-24 (×3): qty 1

## 2022-01-24 MED ORDER — K PHOS MONO-SOD PHOS DI & MONO 155-852-130 MG PO TABS
250.0000 mg | ORAL_TABLET | Freq: Three times a day (TID) | ORAL | Status: AC
  Administered 2022-01-24 (×3): 250 mg via ORAL
  Filled 2022-01-24 (×3): qty 1

## 2022-01-24 MED ORDER — AMOXICILLIN-POT CLAVULANATE 875-125 MG PO TABS
1.0000 | ORAL_TABLET | Freq: Two times a day (BID) | ORAL | Status: DC
  Administered 2022-01-24 – 2022-01-26 (×5): 1 via ORAL
  Filled 2022-01-24 (×5): qty 1

## 2022-01-24 MED ORDER — POTASSIUM CHLORIDE CRYS ER 20 MEQ PO TBCR
40.0000 meq | EXTENDED_RELEASE_TABLET | Freq: Two times a day (BID) | ORAL | Status: AC
  Administered 2022-01-24 (×2): 40 meq via ORAL
  Filled 2022-01-24 (×2): qty 2

## 2022-01-24 NOTE — Progress Notes (Signed)
ANTICOAGULATION CONSULT NOTE  Pharmacy Consult for Heparin Indication: atrial fibrillation  Allergies  Allergen Reactions   Shellfish Allergy Anaphylaxis, Hives and Swelling    Tongue swelling    Sulfa Antibiotics Anaphylaxis, Hives and Swelling    Tongue swelling   Ace Inhibitors Other (See Comments) and Cough    CKD, renal failure    Invokana [Canagliflozin] Other (See Comments)    Syncope Dehydration    Lexapro [Escitalopram] Other (See Comments)    Buzzing in ears Headaches "Felt like a zombie"   Metformin And Related Itching   Pravachol [Pravastatin Sodium] Other (See Comments)    Myalgias    Repatha [Evolocumab] Other (See Comments)    Myalgias Flu like symptoms    Tricor [Fenofibrate] Other (See Comments)    Myalgias     Zestril [Lisinopril] Cough   Crestor [Rosuvastatin] Other (See Comments)    Myalgias    Horse-Derived Products Rash    horse serum   Lipitor [Atorvastatin] Other (See Comments)    Myalgias    Livalo [Pitavastatin] Other (See Comments)    Myalgias    Milk (Cow) Diarrhea and Nausea Only    Stomach upset    Tape Rash    Patient Measurements: Height: '6\' 2"'$  (188 cm) Weight: 129.4 kg (285 lb 4.4 oz) IBW/kg (Calculated) : 82.2 Heparin Dosing Weight: 109 kg  Vital Signs: Temp: 97.8 F (36.6 C) (09/09 1150) Temp Source: Oral (09/09 1150) BP: 140/92 (09/09 1150) Pulse Rate: 86 (09/09 1150)  Labs: Recent Labs    01/21/22 1911 01/22/22 0450 01/22/22 1433 01/22/22 1439 01/22/22 2251 01/23/22 0732 01/23/22 1258 01/23/22 1753 01/24/22 0402  HGB 11.5* 10.4*  --  11.6*  --  9.8*  --   --  9.5*  HCT 38.2* 36.3*  --  34.0*  --  33.3*  --   --  32.2*  PLT 348 303  --   --   --  273  --   --  302  APTT 50*  --   --   --  63* 60*  --  78* 73*  LABPROT 22.3* 24.9*  --   --   --   --   --   --   --   INR 2.0* 2.3*  --   --   --   --   --   --   --   HEPARINUNFRC  --   --   --   --  >1.10* >1.10*  --   --  >1.10*  CREATININE 1.91* 1.81*  1.68*  --   --  1.45*  --   --  1.30*  CKTOTAL  --  180  --   --   --  174  --   --   --   TROPONINIHS  --   --  454*  --   --   --  157*  --   --      Estimated Creatinine Clearance: 74.5 mL/min (A) (by C-G formula based on SCr of 1.3 mg/dL (H)).   Medical History: Past Medical History:  Diagnosis Date   Anxiety    Arthritis    Atrial fibrillation (HCC)    CAD (coronary artery disease)    a. 2010: DES to CTO of RCA. EF 55% b. 07/2016: cath showing total occlusion within previously placed RCA stent (collaterals present), severe stenosis along LCx and OM1 (treated with 2 overlapping DES). c. repeat cath in 01/2018 showing patent stents along LCx and OM  with CTO of D2, CTO of distal LCx, and CTO of RCA with collaterals present overall unchanged since 2018 with medical management recom   Cellulitis and abscess rt groin    Complication of anesthesia    " I woke up during a colonoscopy "      Depression    Diabetes mellitus    Diastolic CHF ([place])    Disorders of iron metabolism    Dysrhythmia    Fibromyalgia    GERD (gastroesophageal reflux disease)    History of hiatal hernia    Hyperlipidemia    Hypertension    Low serum testosterone level    Medically noncompliant    Myocardial infarction [facility])    05-23-20   Pneumonia     Medications:  See electronic med rec  Assessment: 71 y.o. M known to pharmacy from abx dosing for sepsis. Pt on Eliquis PTA for afib. Last dose 9/6 a.m. Holding currently in case procedures are needed. To start heparin gtt.   Heparin level elevated as expected (>1.1) with recent DOAC, aPTT at goal on 1950 units/hr (levels not correlating yet). Hgb 9.5, plt 302 - CBC stable. No s/sx of bleeding or infusion issues.   Goal of Therapy:  Heparin level 0.3-0.7 units/ml, aPTT 66-102 sec Monitor platelets by anticoagulation protocol: Yes   Plan:  Cont heparin infusion at 1950 units/hr Daily heparin level, aPTT, and CBC Planning to resume Eliquis after all  procedures done.  [place] Crane, Roylene Reason, BCCP Clinical Pharmacist  01/24/2022 2:57 PM   [facility] pharmacy phone numbers are listed on [email or web address]

## 2022-01-24 NOTE — Progress Notes (Addendum)
Progress Note  Primary GI: [facility]   Subjective  Chief Complaint: Elevated LFTs and IDA  Patient just got back from MRI, had some sedation.  Was able to answer questions but was asleep some the time.  58 of information came from wife. Wife had several questions about gallbladder, liver function. Went over CT, ultrasound and HIDA results with family. Patient denies any nausea, vomiting, fevers or chills. Patient's wife does describe Kostic esophageal exposure 10 years ago, has been having some dysphagia more consistently with pills and solids.  Wife is interested in patient potentially getting endoscopic evaluation this inpatient visit if possible. Patient had bowel movement yesterday brown, no hematochezia or melena.   Objective   Vital signs in last 24 hours: Temp:  [97.6 F (36.4 C)-98.2 F (36.8 C)] 97.8 F (36.6 C) (09/09 1150) Pulse Rate:  [80-99] 86 (09/09 1150) Resp:  [16-21] 20 (09/09 1150) BP: (96-156)/(73-92) 140/92 (09/09 1150) SpO2:  [91 %-98 %] 98 % (09/09 1150) Last BM Date : 01/23/22 Last BM recorded by nurses in past 5 days Stool Type: Type 6 (Mushy consistency with ragged edges) (01/23/2022  9:00 PM)  General:   Pleasant Obese in NAD Lungs: Respirations even and unlabored. Lungs clear to auscultation anterior.  Heart: Normal S1, S2. No MRG. Regular rate and rhythm.  Abdomen:  Soft, nondistended, mild left upper quadrant and right lower quadrant.  Rectal:  Not performed.  Msk:  Symmetrical without gross deformities. Peripheral pulses intact.  Extremities:  + Gangrenous changes in his right foot/toes, scabbed clean looking wounds on right lower extremity, somewhat cold to touch with faint pulses Neurologic:  Alert and  oriented x4;  grossly normal neurologically.  Psychiatric: Demonstrates good judgement and reason without abnormal affect or behaviors.  Intake/Output from previous day: 09/08 0701 - 09/09 0700 In: 1426.2 [P.O.:720; I.V.:434.5; IV  Piggyback:271.7] Out: 3900 [Urine:3900] Intake/Output this shift: Total I/O In: -  Out: 700 [Urine:700]  Studies/Results: MR BRAIN WO CONTRAST  Result Date: 01/24/2022 CLINICAL DATA:  Epidural abscess suspected.  Brain abscess. EXAM: MRI HEAD WITHOUT CONTRAST MRI CERVICAL SPINE WITHOUT CONTRAST TECHNIQUE: Multiplanar, multiecho pulse sequences of the brain and surrounding structures, and cervical spine, to include the craniocervical junction and cervicothoracic junction, were obtained without intravenous contrast. COMPARISON:  Brain MRI 11/23/2020 FINDINGS: MRI HEAD FINDINGS Brain: Motion degraded but still overall diagnostic. Negative for infarct, hemorrhage, hydrocephalus, mass, or collection. No evidence of intracranial purulence. Generalized brain atrophy. Vascular: Abnormal left vertebral and skull base ICA flow voids, stable from 2022. Skull and upper cervical spine: No evidence of marrow lesion Sinuses/Orbits: No acute finding MRI CERVICAL SPINE FINDINGS Only motion degraded sagittal images could be obtained before the patient could no longer continue the study. STIR imaging is negative for edema or fracture. The spinal canal is patent without evidence of collection. ACDF at C5-6 and C6-7. IMPRESSION: Brain MRI: Motion degraded but diagnostic study that is negative for acute finding. No cerebral abscess. Cervical MRI: Motion degraded and truncated study due to patient condition. No acute finding in the cervical spine. Electronically Signed   By: Jorje Guild M.D.   On: 01/24/2022 11:57   MR CERVICAL SPINE WO CONTRAST  Result Date: 01/24/2022 CLINICAL DATA:  Epidural abscess suspected.  Brain abscess. EXAM: MRI HEAD WITHOUT CONTRAST MRI CERVICAL SPINE WITHOUT CONTRAST TECHNIQUE: Multiplanar, multiecho pulse sequences of the brain and surrounding structures, and cervical spine, to include the craniocervical junction and cervicothoracic junction, were obtained without intravenous contrast.  COMPARISON:  Brain MRI 11/23/2020 FINDINGS: MRI HEAD FINDINGS Brain: Motion degraded but still overall diagnostic. Negative for infarct, hemorrhage, hydrocephalus, mass, or collection. No evidence of intracranial purulence. Generalized brain atrophy. Vascular: Abnormal left vertebral and skull base ICA flow voids, stable from 2022. Skull and upper cervical spine: No evidence of marrow lesion Sinuses/Orbits: No acute finding MRI CERVICAL SPINE FINDINGS Only motion degraded sagittal images could be obtained before the patient could no longer continue the study. STIR imaging is negative for edema or fracture. The spinal canal is patent without evidence of collection. ACDF at C5-6 and C6-7. IMPRESSION: Brain MRI: Motion degraded but diagnostic study that is negative for acute finding. No cerebral abscess. Cervical MRI: Motion degraded and truncated study due to patient condition. No acute finding in the cervical spine. Electronically Signed   By: Jorje Guild M.D.   On: 01/24/2022 11:57   NM Hepatobiliary Liver Func  Result Date: 01/22/2022 CLINICAL DATA:  Abdominal pain.  Biliary obstruction suspected EXAM: NUCLEAR MEDICINE HEPATOBILIARY IMAGING TECHNIQUE: Sequential images of the abdomen were obtained out to 60 minutes following intravenous administration of radiopharmaceutical. RADIOPHARMACEUTICALS:  7.6 mCi Tc-69m Choletec IV COMPARISON:  01/21/2022 FINDINGS: Prompt uptake and biliary excretion of activity by the liver is seen. Gallbladder activity is visualized, consistent with patency of cystic duct. Biliary activity passes into small bowel, consistent with patent common bile duct. IMPRESSION: Normal scintigraphic hepatobiliary scan without evidence of biliary obstruction. Electronically Signed   By: [name]   On: 01/22/2022 14:21    Lab Results: Recent Labs    01/22/22 0450 01/22/22 1439 01/23/22 0732 01/24/22 0402  WBC 19.0*  --  12.4* 10.2  HGB 10.4* 11.6* 9.8* 9.5*  HCT 36.3*  34.0* 33.3* 32.2*  PLT 303  --  273 302   BMET Recent Labs    01/22/22 1433 01/22/22 1439 01/23/22 0732 01/24/22 0402  NA 135 135 135 134*  K 4.2 4.1 3.9 3.2*  CL 101  --  100 97*  CO2 22  --  22 23  GLUCOSE 155*  --  132* 204*  BUN 29*  --  29* 26*  CREATININE 1.68*  --  1.45* 1.30*  CALCIUM 8.6*  --  9.0 8.6*   LFT Recent Labs    01/24/22 0402  PROT 7.1  ALBUMIN 3.1*  AST 454*  ALT 607*  ALKPHOS 51  BILITOT 1.3*   PT/INR Recent Labs    01/21/22 1911 01/22/22 0450  LABPROT 22.3* 24.9*  INR 2.0* 2.3*      Patient profile:   71year old male with significant cardiac history, A-fib on Eliquis, CAD with cardiomyopathy on Plavix, recent Streptococcus bacteremia enteric coccus infection in the urine with SIRS with elevated liver function in addition to elevated troponin. Negative HIDA    Impression/Plan:   Elevated LFTs AST 454 ALT 607 ( AST 647 ALT 1516)  Alkphos 51 TBili 1.3 01/22/2022 INR 2.3 Negative HIDA Correlated with elevated troponin, thought to be possibly Dili from multiple antibiotics versus ischemic event. LFTs trending down Continue to trend  IDA  CBC on 01/24/2022   WBC 10.2 HGB 9.5 MCV 79.3 Platelets 302 Follow up H/H , transfuse below 8 No signs of active bleeding admission. Colonoscopy with multiple polyps Some dysphagia per wife and patient, becoming more consistent. Continue PPI Patient and wife are interested in potentially getting colonoscopy and endoscopy at this admission if patient's hemodynamic status allows. Will continue to follow along  Sepsis 01/24/2022 WBC 10.2  01/22/2022 Lactic Acid 2.1  Blood pressure (!) 140/92, pulse 86, temperature 97.8 F (36.6 C), temperature source Oral, resp. rate 20, height '6\' 2"'$  (1.88 m), weight 129.4 kg, SpO2 98 %. unclear source of infection but history of recurrent Streptococcus bacteremia CT of the abdomen and right upper quadrant ultrasound with cholelithiasis and gallbladder wall  thickening HIDA scan normal History of gangrenous right foot White count improving, infectious disease following  A-fib with RVR Continued on Plavix, Eliquis on hold and instead on heparin drip  PVD with gangrenous foot  AKI on CKD stage III  CAD: On Plavix and heparin Elevated troponin, Cardio following    LOS: 3 days   Vladimir Crofts  01/24/2022, 12:35 PM   Attending physician's note   I have taken a history, reviewed the chart and examined the patient. I performed a substantive portion of this encounter, including complete performance of at least one of the key components, in conjunction with the APP. I agree with the APP's note, impression and recommendations.   Sepsis and bacteremia, unclear source of infection LFT improving  Is currently on broad-spectrum antibiotics  History of chronic iron deficiency anemia, will benefit from EGD and colonoscopy for evaluation once acute issues resolve.  Can consider doing the procedures during this hospitalization, wife is worried that he will not do them as outpatient, his outpatient gastroenterologist recommended doing the procedures earlier this year.   He is currently on Plavix and heparin gtt.  Consider holding Plavix in anticipation of potential endoscopic procedures next week GI will reevaluate on Monday  Please call with any questions   The patient was provided an opportunity to ask questions and all were answered. The patient agreed with the plan and demonstrated an understanding of the instructions.   Damaris Hippo , MD [phone]

## 2022-01-24 NOTE — Progress Notes (Signed)
Progress Note  Patient: [name] [name] NIO:[id number] DOB: 06/05/1950  DOA: 01/21/2022  DOS: 01/24/2022    Brief hospital course: [name] is a 71 y.o. male with a history of PVD, right foot toe gangrene, PAF on eliquis, CAD/ICM/chronic HFpEF, IDT2DM, recurrent streptococcal bacteremia, morbid obesity, HTN, HLD who presented to the ED at [facility] on 9/6 on the advice of ID due to subjective fever, chills, abdominal pain, nausea, vomiting and shortness of breath. He had been seen 9/5 in the [facility] for shortness of breath which resolved. Here he qualified for diagnosis of SIRS though unclear source, with mild AKI and LFT elevations. WBC 21k with left shift. Troponin was elevated and ECG revealed AFib with RBBB, nonspecific ST-T changes. CXR showed cardiomegaly with central vascular congestion. He was hypoxic.  CT A/P raises concern for gallbladder stones/sludge, questionable pericholecystic fat stranding, hepatomegaly, hepatic steatosis, small volume ascites and trace right pleural effusion.  RUQ [place] with cholelithiasis and mild gallbladder wall thickening without evidence of acute cholecystitis. HIDA scan was negative. Broad IV antibiotics were started and the patient admitted for SIRS. Troponin and LFTs trended upward, though the patient has no chest pain. Blood cultures have remained NGTD.   Assessment and Plan: SIRS: Hx Strep bacteremia, though Cx's NGTD. Positive gallbladder findings on imaging though HIDA negative, not consistent with cholecystitis. There is dry gangrenous change to distal toes though this is improved from prior. Urine culture from ED 9/5 grew 3k of sensitive Enterococcus. Patient give limited history, but denies urinary symptoms.  - Narrowed abx to ceftriaxone 2g dose and flagyl. D/w Dr. Tommy Medal, appreciate ID recommendations going forward.  - Monitor cultures regularly.  - With HA, neck pain, hx skull fracture, MRI brain and C spine ordered with ativan for sedation planned today. Motion  degraded studies do not reveal prominent abnormalities based on radiologist interpretation. Will possibly move forward with MR left foot, right ankle.  - Will plan on discharging with amox/augmentin po at discharge to have on hand given his recurrent GBS bacteremia. - WBC improved/normalized.  Acute hypoxic respiratory failure, pleural effusion, pulmonary edema: VBG reassuring, pulse oximetry complicated by PVD. No PNA on CXR, doubt PE on eliquis.  - Improved with IV lasix 9/8, will hold dose for today unless unable to wean O2.   Troponin elevation, CAD, chronic combined HFrEF: Most recent echo showed LVEF 45-50%.  - Appreciate cardiology recommendations. Note significant CAD with no specific culprit lesion at last cath, troponin elevated here due to demand ischemia without angina.  - Continue heparin, beta blocker. Statin intolerant as below.   Uncontrolled IDT2DM with hyperglycemia: HbA1c 7.8%.  - Near inpatient goal despite giving drastically fewer units of insulin compared to home regimen (confirmed 60u of 70/30 BID and usually 15u humalog pen with meals, or more depending on sliding scale). Spouse states they eat quite calorie rich meals at home compared to here. Will add levemir 10u BID and monitor closely. If oral intake normalizes, will certainly need more insulin.   Cholelithiasis: No evidence of acute cholecystitis by HIDA.   Nausea, vomiting: Stable - Scheduled antiemetic per GI. Tigan in lieu of other agents that may prolong QT interval.   Prolonged QT interval:  - Monitor on telemetry - Keep K/Mg replete.   Stage 2 pressure injury to bilateral buttocks, POA: Offload as able. Does not appear purulent.   Morbid obesity: Body mass index is 36.63 kg/m.   AKI on stage IIIa CKD: Suspected to be due to sepsis  as it has improved. At first given IVF, then lasix x1. - DC'ed vancomycin. Monitor BMP. If Creatinine remains in current range, will need to update CKD severity.   PAF:  Continues to be in AFib currently, though rate is controlled - Continue metoprolol, heparin. Once we know no LP or other procedures needed, will transition back to eliquis.   Lactic acidosis: Improved.   GERD:  - Augmented PPI per GI. Diet deferred to them at this time.   Hypophosphatemia:  - Supplement again today  Hypokalemia:  - Supplement x39mq today given prolonged QT interval.   Subjective: Seen this morning after ativan given, resting quietly prior to taken down to MRI. He is without complaints. Wife at bedside.  Objective: Vitals:   01/23/22 2305 01/24/22 0301 01/24/22 0719 01/24/22 1150  BP: 96/74 116/77 136/87 (!) 140/92  Pulse: 91 80 99 86  Resp: '16 17 20 20  '$ Temp: 98.2 F (36.8 C) 98.1 F (36.7 C) 97.8 F (36.6 C) 97.8 F (36.6 C)  TempSrc: Oral Oral Oral Oral  SpO2: 95% 91% 94% 98%  Weight:      Height:       Gen: 71y.o. male in no distress Pulm: Nonlabored breathing room air at rest, SpO2 90%. Clear anterolaterally (limited by pt drowsiness). CV: Irreg without murmur, rub, or gallop. No JVD, trace pitting dependent edema. GI: Abdomen soft, non-tender, non-distended, with normoactive bowel sounds.  Ext: Warm, no deformities Skin: No new rashes, lesions or ulcers on visualized skin. Distal toe dry gangrene stable on L > R toes right ankle ulcer stable with c/d/I foam dressing.  Neuro: Drowsy but rousable and appropriate, follows commands, no focal deficits on limited exam.   Data Personally reviewed: CBC: Recent Labs  Lab 01/20/22 0129 01/21/22 1911 01/22/22 0450 01/22/22 1439 01/23/22 0732 01/24/22 0402  WBC 10.4 20.9* 19.0*  --  12.4* 10.2  NEUTROABS 8.5* 18.1*  --   --  10.1*  --   HGB 11.1* 11.5* 10.4* 11.6* 9.8* 9.5*  HCT 37.8* 38.2* 36.3* 34.0* 33.3* 32.2*  MCV 78.8* 77.6* 80.5  --  80.2 79.3*  PLT 301 348 303  --  273 3174  Basic Metabolic Panel: Recent Labs  Lab 01/21/22 1911 01/22/22 0450 01/22/22 1433 01/22/22 1439 01/23/22 0732  01/24/22 0402  NA 132* 133* 135 135 135 134*  K 4.1 4.3 4.2 4.1 3.9 3.2*  CL 95* 98 101  --  100 97*  CO2 24 21* 22  --  22 23  GLUCOSE 184* 173* 155*  --  132* 204*  BUN 28* 30* 29*  --  29* 26*  CREATININE 1.91* 1.81* 1.68*  --  1.45* 1.30*  CALCIUM 9.5 8.9 8.6*  --  9.0 8.6*  MG  --   --   --   --  2.2 2.2  PHOS  --   --   --   --  2.3* 2.1*   GFR: Estimated Creatinine Clearance: 74.5 mL/min (A) (by C-G formula based on SCr of 1.3 mg/dL (H)). Liver Function Tests: Recent Labs  Lab 01/21/22 1911 01/22/22 0450 01/22/22 1433 01/23/22 0732 01/24/22 0402  AST 243* 522* 1,516* 647* 454*  ALT 139* 318* 720* 670* 607*  ALKPHOS 50 48 44 47 51  BILITOT 1.6* 2.3* 1.8* 2.2* 1.3*  PROT 8.7* 7.3 7.0 7.2 7.1  ALBUMIN 4.1 3.4* 3.2* 3.2* 3.1*   Recent Labs  Lab 01/21/22 2130 01/23/22 0732  LIPASE 21 28   No results  for input(s): "AMMONIA" in the last 168 hours. Coagulation Profile: Recent Labs  Lab 01/20/22 0129 01/21/22 1911 01/22/22 0450  INR 1.4* 2.0* 2.3*   Cardiac Enzymes: Recent Labs  Lab 01/22/22 0450 01/23/22 0732  CKTOTAL 180 174   BNP (last 3 results) No results for input(s): "PROBNP" in the last 8760 hours. HbA1C: No results for input(s): "HGBA1C" in the last 72 hours. CBG: Recent Labs  Lab 01/23/22 1050 01/23/22 1634 01/23/22 2104 01/24/22 0609 01/24/22 1141  GLUCAP 158* 154* 239* 217* 232*   Lipid Profile: Recent Labs    01/23/22 0732  CHOL 127  HDL 11*  LDLCALC 76  TRIG 199*  CHOLHDL 11.5   Thyroid Function Tests: No results for input(s): "TSH", "T4TOTAL", "FREET4", "T3FREE", "THYROIDAB" in the last 72 hours. Anemia Panel: No results for input(s): "VITAMINB12", "FOLATE", "FERRITIN", "TIBC", "IRON", "RETICCTPCT" in the last 72 hours. Urine analysis:    Component Value Date/Time   COLORURINE YELLOW 01/22/2022 0042   APPEARANCEUR CLEAR 01/22/2022 0042   APPEARANCEUR Clear 10/23/2021 1224   LABSPEC 1.030 01/22/2022 0042   PHURINE 6.0  01/22/2022 0042   GLUCOSEU >=500 (A) 01/22/2022 0042   HGBUR SMALL (A) 01/22/2022 0042   BILIRUBINUR NEGATIVE 01/22/2022 0042   BILIRUBINUR Negative 10/23/2021 1224   KETONESUR 5 (A) 01/22/2022 0042   PROTEINUR 100 (A) 01/22/2022 0042   UROBILINOGEN negative 08/13/2014 1725   UROBILINOGEN 0.2 08/07/2013 1747   NITRITE NEGATIVE 01/22/2022 0042   LEUKOCYTESUR NEGATIVE 01/22/2022 0042   Recent Results (from the past 240 hour(s))  Blood Culture (routine x 2)     Status: None (Preliminary result)   Collection Time: 01/20/22  1:29 AM   Specimen: BLOOD  Result Value Ref Range Status   Specimen Description BLOOD RAC  Final   Special Requests   Final    BOTTLES DRAWN AEROBIC AND ANAEROBIC Blood Culture adequate volume   Culture   Final    NO GROWTH 4 DAYS Performed at [facility], [address] [place], [place] [address]    Report Status PENDING  Incomplete  Blood Culture (routine x 2)     Status: None (Preliminary result)   Collection Time: 01/20/22  1:29 AM   Specimen: BLOOD  Result Value Ref Range Status   Specimen Description BLOOD RFOA  Final   Special Requests   Final    BOTTLES DRAWN AEROBIC AND ANAEROBIC Blood Culture adequate volume   Culture   Final    NO GROWTH 4 DAYS Performed at [facility], [address] [place], [place] [address]    Report Status PENDING  Incomplete  SARS Coronavirus 2 by RT PCR (hospital order, performed in [place] hospital lab) *cepheid single result test* Urine, Clean Catch     Status: None   Collection Time: 01/20/22  3:53 AM   Specimen: Urine, Clean Catch; Nasal Swab  Result Value Ref Range Status   SARS Coronavirus 2 by RT PCR NEGATIVE NEGATIVE Final    Comment: (NOTE) SARS-CoV-2 target nucleic acids are NOT DETECTED.  The SARS-CoV-2 RNA is generally detectable in upper and lower respiratory specimens during the acute phase of infection. The lowest concentration of SARS-CoV-2 viral copies this assay can detect is 250 copies / mL. A  negative result does not preclude SARS-CoV-2 infection and should not be used as the sole basis for treatment or other patient management decisions.  A negative result may occur with improper specimen collection / handling, submission of specimen other than nasopharyngeal swab, presence of  viral mutation(s) within the areas targeted by this assay, and inadequate number of viral copies (<250 copies / mL). A negative result must be combined with clinical observations, patient history, and epidemiological information.  Fact Sheet for Patients:   https://www.patel.info/  Fact Sheet for Healthcare Providers: https://hall.com/  This test is not yet approved or  cleared by the [place] FDA and has been authorized for detection and/or diagnosis of SARS-CoV-2 by FDA under an Emergency Use Authorization (EUA).  This EUA will remain in effect (meaning this test can be used) for the duration of the COVID-19 declaration under Section 564(b)(1) of the Act, 21 U.S.C. section 360bbb-3(b)(1), unless the authorization is terminated or revoked sooner.  Performed at [facility], [address], [place], [place] [address]   Urine Culture     Status: Abnormal   Collection Time: 01/20/22  3:57 AM   Specimen: In/Out Cath Urine  Result Value Ref Range Status   Specimen Description   Final    IN/OUT CATH URINE Performed at [facility], [address], [place], [place] [address]    Special Requests   Final    NONE Performed at [facility], [address], [place], [place] [address]    Culture 3,000 COLONIES/mL ENTEROCOCCUS FAECALIS (A)  Final   Report Status 01/22/2022 FINAL  Final   Organism ID, Bacteria ENTEROCOCCUS FAECALIS (A)  Final      Susceptibility   Enterococcus faecalis - MIC*    AMPICILLIN <=2 SENSITIVE Sensitive     NITROFURANTOIN <=16 SENSITIVE Sensitive     VANCOMYCIN 2 SENSITIVE Sensitive     * 3,000 COLONIES/mL ENTEROCOCCUS FAECALIS   Blood culture (routine x 2)     Status: None (Preliminary result)   Collection Time: 01/21/22  9:30 PM   Specimen: BLOOD  Result Value Ref Range Status   Specimen Description BLOOD BLOOD RIGHT ARM  Final   Special Requests   Final    BOTTLES DRAWN AEROBIC AND ANAEROBIC Blood Culture adequate volume   Culture   Final    NO GROWTH 3 DAYS Performed at [facility], [place] [address] [place], [place] [address]    Report Status PENDING  Incomplete  Resp Panel by RT-PCR (Flu A&B, Covid) Anterior Nasal Swab     Status: None   Collection Time: 01/21/22 11:04 PM   Specimen: Anterior Nasal Swab  Result Value Ref Range Status   SARS Coronavirus 2 by RT PCR NEGATIVE NEGATIVE Final    Comment: (NOTE) SARS-CoV-2 target nucleic acids are NOT DETECTED.  The SARS-CoV-2 RNA is generally detectable in upper respiratory specimens during the acute phase of infection. The lowest concentration of SARS-CoV-2 viral copies this assay can detect is 138 copies/mL. A negative result does not preclude SARS-Cov-2 infection and should not be used as the sole basis for treatment or other patient management decisions. A negative result may occur with  improper specimen collection/handling, submission of specimen other than nasopharyngeal swab, presence of viral mutation(s) within the areas targeted by this assay, and inadequate number of viral copies(<138 copies/mL). A negative result must be combined with clinical observations, patient history, and epidemiological information. The expected result is Negative.  Fact Sheet for Patients:  EntrepreneurPulse.com.au  Fact Sheet for Healthcare Providers:  IncredibleEmployment.be  This test is no t yet approved or cleared by the [place] FDA and  has been authorized for detection and/or diagnosis of SARS-CoV-2 by FDA under an Emergency Use Authorization (EUA). This EUA will remain  in  effect (meaning this test can be  used) for the duration of the COVID-19 declaration under Section 564(b)(1) of the Act, 21 U.S.C.section 360bbb-3(b)(1), unless the authorization is terminated  or revoked sooner.       Influenza A by PCR NEGATIVE NEGATIVE Final   Influenza B by PCR NEGATIVE NEGATIVE Final    Comment: (NOTE) The Xpert Xpress SARS-CoV-2/FLU/RSV plus assay is intended as an aid in the diagnosis of influenza from Nasopharyngeal swab specimens and should not be used as a sole basis for treatment. Nasal washings and aspirates are unacceptable for Xpert Xpress SARS-CoV-2/FLU/RSV testing.  Fact Sheet for Patients: EntrepreneurPulse.com.au  Fact Sheet for Healthcare Providers: IncredibleEmployment.be  This test is not yet approved or cleared by the [place] FDA and has been authorized for detection and/or diagnosis of SARS-CoV-2 by FDA under an Emergency Use Authorization (EUA). This EUA will remain in effect (meaning this test can be used) for the duration of the COVID-19 declaration under Section 564(b)(1) of the Act, 21 U.S.C. section 360bbb-3(b)(1), unless the authorization is terminated or revoked.  Performed at [facility], [place] [address] [place], [place] [address]   MRSA Next Gen by PCR, Nasal     Status: None   Collection Time: 01/22/22  8:30 PM   Specimen: Nasal Mucosa; Nasal Swab  Result Value Ref Range Status   MRSA by PCR Next Gen NOT DETECTED NOT DETECTED Final    Comment: (NOTE) The GeneXpert MRSA Assay (FDA approved for NASAL specimens only), is one component of a comprehensive MRSA colonization surveillance program. It is not intended to diagnose MRSA infection nor to guide or monitor treatment for MRSA infections. Test performance is not FDA approved in patients less than 1 years old. Performed at [facility], [place] [address] [place], [place] [address]      MR BRAIN WO CONTRAST  Result Date: 01/24/2022 CLINICAL DATA:   Epidural abscess suspected.  Brain abscess. EXAM: MRI HEAD WITHOUT CONTRAST MRI CERVICAL SPINE WITHOUT CONTRAST TECHNIQUE: Multiplanar, multiecho pulse sequences of the brain and surrounding structures, and cervical spine, to include the craniocervical junction and cervicothoracic junction, were obtained without intravenous contrast. COMPARISON:  Brain MRI 11/23/2020 FINDINGS: MRI HEAD FINDINGS Brain: Motion degraded but still overall diagnostic. Negative for infarct, hemorrhage, hydrocephalus, mass, or collection. No evidence of intracranial purulence. Generalized brain atrophy. Vascular: Abnormal left vertebral and skull base ICA flow voids, stable from 2022. Skull and upper cervical spine: No evidence of marrow lesion Sinuses/Orbits: No acute finding MRI CERVICAL SPINE FINDINGS Only motion degraded sagittal images could be obtained before the patient could no longer continue the study. STIR imaging is negative for edema or fracture. The spinal canal is patent without evidence of collection. ACDF at C5-6 and C6-7. IMPRESSION: Brain MRI: Motion degraded but diagnostic study that is negative for acute finding. No cerebral abscess. Cervical MRI: Motion degraded and truncated study due to patient condition. No acute finding in the cervical spine. Electronically Signed   By: Jorje Guild M.D.   On: 01/24/2022 11:57   MR CERVICAL SPINE WO CONTRAST  Result Date: 01/24/2022 CLINICAL DATA:  Epidural abscess suspected.  Brain abscess. EXAM: MRI HEAD WITHOUT CONTRAST MRI CERVICAL SPINE WITHOUT CONTRAST TECHNIQUE: Multiplanar, multiecho pulse sequences of the brain and surrounding structures, and cervical spine, to include the craniocervical junction and cervicothoracic junction, were obtained without intravenous contrast. COMPARISON:  Brain MRI 11/23/2020 FINDINGS: MRI HEAD FINDINGS Brain: Motion degraded but still overall diagnostic. Negative for infarct, hemorrhage,  hydrocephalus, mass, or collection. No evidence of  intracranial purulence. Generalized brain atrophy. Vascular: Abnormal left vertebral and skull base ICA flow voids, stable from 2022. Skull and upper cervical spine: No evidence of marrow lesion Sinuses/Orbits: No acute finding MRI CERVICAL SPINE FINDINGS Only motion degraded sagittal images could be obtained before the patient could no longer continue the study. STIR imaging is negative for edema or fracture. The spinal canal is patent without evidence of collection. ACDF at C5-6 and C6-7. IMPRESSION: Brain MRI: Motion degraded but diagnostic study that is negative for acute finding. No cerebral abscess. Cervical MRI: Motion degraded and truncated study due to patient condition. No acute finding in the cervical spine. Electronically Signed   By: Jorje Guild M.D.   On: 01/24/2022 11:57   NM Hepatobiliary Liver Func  Result Date: 01/22/2022 CLINICAL DATA:  Abdominal pain.  Biliary obstruction suspected EXAM: NUCLEAR MEDICINE HEPATOBILIARY IMAGING TECHNIQUE: Sequential images of the abdomen were obtained out to 60 minutes following intravenous administration of radiopharmaceutical. RADIOPHARMACEUTICALS:  7.6 mCi Tc-50m Choletec IV COMPARISON:  01/21/2022 FINDINGS: Prompt uptake and biliary excretion of activity by the liver is seen. Gallbladder activity is visualized, consistent with patency of cystic duct. Biliary activity passes into small bowel, consistent with patent common bile duct. IMPRESSION: Normal scintigraphic hepatobiliary scan without evidence of biliary obstruction. Electronically Signed   By: [name]   On: 01/22/2022 14:21    Family Communication: Spouse at bedside  Disposition: Status is: Inpatient Remains inpatient appropriate because: Ongoing work up and IV antibiotics  Planned Discharge Destination: Home   RPatrecia Pour MD 01/24/2022 12:28 PM Page by [email or web address] [email or web address]

## 2022-01-24 NOTE — Progress Notes (Addendum)
Progress Note  Patient Name: [name] [name] Date of Encounter: 01/24/2022  [facility] Cardiologist: Jenkins Rouge, MD   Subjective   Denies any chest pain or shortness of breath.  Admitted with bacteremia  Inpatient Medications    Scheduled Meds:  clopidogrel  75 mg Oral Daily   DULoxetine  90 mg Oral Daily   ezetimibe  10 mg Oral Daily   gabapentin  300 mg Oral TID   insulin aspart  0-20 Units Subcutaneous TID WC   insulin aspart  0-5 Units Subcutaneous QHS   insulin detemir  10 Units Subcutaneous BID   isosorbide mononitrate  30 mg Oral Daily   metoprolol succinate  50 mg Oral Daily   pantoprazole (PROTONIX) IV  40 mg Intravenous Q12H   phosphorus  250 mg Oral TID   potassium chloride  40 mEq Oral BID   trimethobenzamide  200 mg Intramuscular Q8H   Continuous Infusions:  cefTRIAXone (ROCEPHIN)  IV Stopped (01/23/22 2300)   heparin 1,950 Units/hr (01/24/22 0906)   metronidazole 500 mg (01/24/22 0911)   PRN Meds: acetaminophen **OR** acetaminophen, busPIRone, LORazepam, nitroGLYCERIN   Vital Signs    Vitals:   01/23/22 1937 01/23/22 2305 01/24/22 0301 01/24/22 0719  BP: (!) 146/73 96/74 116/77 136/87  Pulse: 93 91 80 99  Resp: (!) '21 16 17 20  '$ Temp: 97.8 F (36.6 C) 98.2 F (36.8 C) 98.1 F (36.7 C) 97.8 F (36.6 C)  TempSrc: Oral Oral Oral Oral  SpO2: 97% 95% 91% 94%  Weight:      Height:        Intake/Output Summary (Last 24 hours) at 01/24/2022 0912 Last data filed at 01/24/2022 0301 Gross per 24 hour  Intake 1426.19 ml  Output 3900 ml  Net -2473.81 ml      01/22/2022    8:31 PM 01/21/2022    6:40 PM 01/09/2022    3:15 PM  Last 3 Weights  Weight (lbs) 285 lb 4.4 oz 268 lb 15.4 oz 270 lb  Weight (kg) 129.4 kg 122 kg 122.471 kg      Telemetry    Atrial fibrillation with controlled ventricular response- Personally Reviewed  ECG    No new EKG to review- Personally Reviewed  Physical Exam   GEN: No acute distress.   Neck: No JVD Cardiac:  RRR, no murmurs, rubs, or gallops.  Respiratory: Clear to auscultation bilaterally. GI: Soft, nontender, non-distended  MS: Trace LE  edema; No deformity. Neuro:  Nonfocal  Psych: Normal affect   Labs    High Sensitivity Troponin:   Recent Labs  Lab 01/20/22 0129 01/20/22 0433 01/22/22 1433 01/23/22 1258  TROPONINIHS 42* 75* 454* 157*      Chemistry Recent Labs  Lab 01/22/22 1433 01/22/22 1439 01/23/22 0732 01/24/22 0402  NA 135 135 135 134*  K 4.2 4.1 3.9 3.2*  CL 101  --  100 97*  CO2 22  --  22 23  GLUCOSE 155*  --  132* 204*  BUN 29*  --  29* 26*  CREATININE 1.68*  --  1.45* 1.30*  CALCIUM 8.6*  --  9.0 8.6*  PROT 7.0  --  7.2 7.1  ALBUMIN 3.2*  --  3.2* 3.1*  AST 1,516*  --  647* 454*  ALT 720*  --  670* 607*  ALKPHOS 44  --  47 51  BILITOT 1.8*  --  2.2* 1.3*  GFRNONAA 43*  --  52* 59*  ANIONGAP 12  --  13 14     Hematology Recent Labs  Lab 01/22/22 0450 01/22/22 1439 01/23/22 0732 01/24/22 0402  WBC 19.0*  --  12.4* 10.2  RBC 4.51  --  4.15* 4.06*  HGB 10.4* 11.6* 9.8* 9.5*  HCT 36.3* 34.0* 33.3* 32.2*  MCV 80.5  --  80.2 79.3*  MCH 23.1*  --  23.6* 23.4*  MCHC 28.7*  --  29.4* 29.5*  RDW 16.3*  --  16.4* 16.5*  PLT 303  --  273 302    BNP Recent Labs  Lab 01/20/22 0129 01/22/22 1433  BNP 267.0* 604.7*     DDimer No results for input(s): "DDIMER" in the last 168 hours.   CHA2DS2-VASc Score = 5   This indicates a 7.2% annual risk of stroke. The patient's score is based upon: CHF History: 1 HTN History: 1 Diabetes History: 1 Stroke History: 0 Vascular Disease History: 1 Age Score: 1 Gender Score: 0      Radiology    NM Hepatobiliary Liver Func  Result Date: 01/22/2022 CLINICAL DATA:  Abdominal pain.  Biliary obstruction suspected EXAM: NUCLEAR MEDICINE HEPATOBILIARY IMAGING TECHNIQUE: Sequential images of the abdomen were obtained out to 60 minutes following intravenous administration of radiopharmaceutical.  RADIOPHARMACEUTICALS:  7.6 mCi Tc-15m Choletec IV COMPARISON:  01/21/2022 FINDINGS: Prompt uptake and biliary excretion of activity by the liver is seen. Gallbladder activity is visualized, consistent with patency of cystic duct. Biliary activity passes into small bowel, consistent with patent common bile duct. IMPRESSION: Normal scintigraphic hepatobiliary scan without evidence of biliary obstruction. Electronically Signed   By: [name] PokeD.O.   On: 01/22/2022 14:21    Cardiac Studies  TTE 12/2021 IMPRESSIONS    1. Left ventricular ejection fraction, by estimation, is 45%. The left  ventricle has mildly decreased function.   2. Right ventricular systolic function is mildly reduced. The right  ventricular size is normal.   3. Left atrial size was mildly dilated. No left atrial/left atrial  appendage thrombus was detected. The LAA emptying velocity was 26 cm/s.   4. Right atrial size was mildly dilated.   5. The mitral valve is grossly normal. Moderate-severe mitral valve  regurgitation appears functional and commissural, from lack of complete  coaption of the leaflets. Systolic reversal seen in the RUPV, otherwise  systolic blunting noted. No evidence of  mitral stenosis.   6. Tricuspid valve regurgitation is moderate.   7. The aortic valve is tricuspid. There is mild calcification of the  aortic valve. Aortic valve regurgitation is moderate. Vena contracta 0.5  cm.   8. There is Severe (Grade IV) atheroma plaque involving the aortic arch  and descending aorta.   9. Agitated saline contrast bubble study was negative, with no evidence  of any interatrial shunt.   Conclusion(s)/Recommendation(s): No evidence of vegetation/infective  endocarditis on this transesophageael echocardiogram.   Patient Profile     71y.o. male  with a hx of CAD, paroxysmal atrial fibrillation, peripheral arterial disease followed by Dr. BTrula [name] hypertension, hyperlipidemia, HFpEF (chronic low normal  EF), DM, anemia, esophageal dysmotility s/p dilatation, HLD (statin and Repatha intolerance) and recurrent bacteremia is being seen 01/23/2022 for the evaluation of Elevated troponin at the request of Dr. GBonner [name]  Assessment & Plan    Elevated  troponin -Hs-troponin 42>>75>>454>> 157 -He has a any chest pain -EKG with improved nonspecific ST/T wave changes -He has very minimal troponin elevation with no acute EKG changes and no chest pain in the setting of sepsis  and volume resuscitation -Suspect demand ischemia with underlying sepsis, bacteremia and coronary artery disease -Does not warrant any inpatient ischemic evaluation   2.  CAD -Last cath March 2023 Chronic occlusion distal RCA and circ with collaterals and occluded D2 Nothing to intevene on  >> medical management  -Plavix 75 mg daily, Zetia 10 mg daily, Imdur 30 mg daily, Toprol-XL 50 mg daily   3.  Paroxysmal atrial fibrillation -Remains in atrial fibrillation this admission with controlled ventricular response >> had A-fib prior to admission.  -On heparin for anticoagulation (Eliquis on hold) -Continue Toprol-XL 50 mg daily -Previously did not tolerate amiodarone   4.  HFpEF - Last echo 12/2021 with LVEF of 45-50% - Hydrated for sepsis during admission. BNP 267 at admit to 604 yesterday.  - He received 1 dose of Lasix 40 mg IV - He put out 3.9 L yesterday and is net -1.1 to 4 L since admission - No weights recorded today's -He does not appear volume overloaded on exam today   5. SIRS 6. Bacteremia - Per primary    7. Elevated LFTS - Now trending down - GI on board - I does not suspect his elevated troponin is due to primary cardiac event   8. PVD - Followed by vascular    9.  Acute on chronic disease stage III -Renal function is improving    [facility] will sign off.   Medication Recommendations: Plavix 75 mg daily, Zetia 10 mg daily, Imdur 30 mg daily, Toprol-XL 50 mg daily.  Restart fenofibrate 160 mg daily  once LFTs normalize.  Restart Eliquis 5 mg twice daily once no procedures are deemed necessary.  Can consider restarting home dose of fark [employer] at discharge Other recommendations (labs, testing, etc): None Follow up as an outpatient: Dr. [name] in 2 weeks  For questions or updates, please contact [place] Please consult [email or web address] for contact info under        Signed, Fransico Him, MD  01/24/2022, 9:12 AM

## 2022-01-24 NOTE — Progress Notes (Signed)
Subjective:  He never wants to go in the MRI "scanner downstairs"   Antibiotics:  Anti-infectives (From admission, onward)    Start     Dose/Rate Route Frequency Ordered Stop   01/24/22 1515  amoxicillin-clavulanate (AUGMENTIN) 875-125 MG per tablet 1 tablet        1 tablet Oral Every 12 hours 01/24/22 1421     01/24/22 0100  vancomycin (VANCOREADY) IVPB 1500 mg/300 mL  Status:  Discontinued        1,500 mg 150 mL/hr over 120 Minutes Intravenous Every 24 hours 01/23/22 1016 01/23/22 1511   01/23/22 2200  cefTRIAXone (ROCEPHIN) 2 g in sodium chloride 0.9 % 100 mL IVPB  Status:  Discontinued        2 g 200 mL/hr over 30 Minutes Intravenous Every 12 hours 01/23/22 1508 01/24/22 1421   01/23/22 1747  metroNIDAZOLE (FLAGYL) IVPB 500 mg  Status:  Discontinued        500 mg 100 mL/hr over 60 Minutes Intravenous Every 8 hours 01/23/22 1511 01/24/22 1421   01/23/22 1600  ceFEPIme (MAXIPIME) 2 g in sodium chloride 0.9 % 100 mL IVPB  Status:  Discontinued        2 g 200 mL/hr over 30 Minutes Intravenous Every 8 hours 01/23/22 1016 01/23/22 1508   01/22/22 2300  vancomycin (VANCOREADY) IVPB 1250 mg/250 mL  Status:  Discontinued        1,250 mg 166.7 mL/hr over 90 Minutes Intravenous Every 24 hours 01/21/22 2253 01/21/22 2305   01/22/22 2300  vancomycin (VANCOREADY) IVPB 1250 mg/250 mL  Status:  Discontinued        1,250 mg 166.7 mL/hr over 90 Minutes Intravenous Every 24 hours 01/21/22 2305 01/23/22 1016   01/22/22 1000  ceFEPIme (MAXIPIME) 2 g in sodium chloride 0.9 % 100 mL IVPB  Status:  Discontinued        2 g 200 mL/hr over 30 Minutes Intravenous Every 12 hours 01/21/22 2253 01/21/22 2305   01/22/22 1000  ceFEPIme (MAXIPIME) 2 g in sodium chloride 0.9 % 100 mL IVPB  Status:  Discontinued        2 g 200 mL/hr over 30 Minutes Intravenous Every 12 hours 01/21/22 2305 01/23/22 1016   01/22/22 0445  ceFEPIme (MAXIPIME) 2 g in sodium chloride 0.9 % 100 mL IVPB  Status:   Discontinued        2 g 200 mL/hr over 30 Minutes Intravenous  Once 01/22/22 0438 01/22/22 0444   01/22/22 0445  vancomycin (VANCOCIN) IVPB 1000 mg/200 mL premix  Status:  Discontinued        1,000 mg 200 mL/hr over 60 Minutes Intravenous  Once 01/22/22 0438 01/22/22 0445   01/22/22 0438  metroNIDAZOLE (FLAGYL) IVPB 500 mg  Status:  Discontinued        500 mg 100 mL/hr over 60 Minutes Intravenous Every 12 hours 01/22/22 0438 01/23/22 1511   01/21/22 2300  vancomycin (VANCOREADY) IVPB 2000 mg/400 mL        2,000 mg 200 mL/hr over 120 Minutes Intravenous  Once 01/21/22 2303 01/22/22 0220   01/21/22 2100  vancomycin (VANCOREADY) IVPB 2000 mg/400 mL  Status:  Discontinued        2,000 mg 200 mL/hr over 120 Minutes Intravenous  Once 01/21/22 2054 01/21/22 2303   01/21/22 2045  ceFEPIme (MAXIPIME) 2 g in sodium chloride 0.9 % 100 mL IVPB        2 g 200 mL/hr  over 30 Minutes Intravenous  Once 01/21/22 2040 01/21/22 2234   01/21/22 2045  metroNIDAZOLE (FLAGYL) IVPB 500 mg        500 mg 100 mL/hr over 60 Minutes Intravenous  Once 01/21/22 2040 01/22/22 0032   01/21/22 2045  vancomycin (VANCOCIN) IVPB 1000 mg/200 mL premix  Status:  Discontinued        1,000 mg 200 mL/hr over 60 Minutes Intravenous  Once 01/21/22 2040 01/21/22 2054       Medications: Scheduled Meds:  amoxicillin-clavulanate  1 tablet Oral Q12H   clopidogrel  75 mg Oral Daily   DULoxetine  90 mg Oral Daily   ezetimibe  10 mg Oral Daily   gabapentin  300 mg Oral TID   insulin aspart  0-20 Units Subcutaneous TID WC   insulin aspart  0-5 Units Subcutaneous QHS   insulin detemir  10 Units Subcutaneous BID   isosorbide mononitrate  30 mg Oral Daily   metoprolol succinate  50 mg Oral Daily   pantoprazole (PROTONIX) IV  40 mg Intravenous Q12H   phosphorus  250 mg Oral TID   potassium chloride  40 mEq Oral BID   trimethobenzamide  200 mg Intramuscular Q8H   Continuous Infusions:  heparin 1,950 Units/hr (01/24/22 0906)    PRN Meds:.acetaminophen **OR** acetaminophen, busPIRone, HYDROcodone-acetaminophen, nitroGLYCERIN    Objective: Weight change:   Intake/Output Summary (Last 24 hours) at 01/24/2022 1553 Last data filed at 01/24/2022 1146 Gross per 24 hour  Intake 1186.19 ml  Output 3950 ml  Net -2763.81 ml   Blood pressure (!) 140/92, pulse 86, temperature 97.8 F (36.6 C), temperature source Oral, resp. rate 20, height '6\' 2"'$  (1.88 m), weight 129.4 kg, SpO2 98 %. Temp:  [97.6 F (36.4 C)-98.2 F (36.8 C)] 97.8 F (36.6 C) (09/09 1150) Pulse Rate:  [80-99] 86 (09/09 1150) Resp:  [16-21] 20 (09/09 1150) BP: (96-156)/(73-92) 140/92 (09/09 1150) SpO2:  [91 %-98 %] 98 % (09/09 1150)  Physical Exam: Physical Exam Constitutional:      Appearance: He is well-developed.  HENT:     Head: Normocephalic and atraumatic.  Eyes:     Conjunctiva/sclera: Conjunctivae normal.  Cardiovascular:     Rate and Rhythm: Normal rate and regular rhythm.  Pulmonary:     Effort: Pulmonary effort is normal. No respiratory distress.     Breath sounds: No wheezing.  Abdominal:     General: There is no distension.     Palpations: Abdomen is soft.  Musculoskeletal:        General: Normal range of motion.     Cervical back: Normal range of motion and neck supple.  Skin:    General: Skin is warm and dry.  Neurological:     General: No focal deficit present.     Mental Status: He is alert. He is confused.  Psychiatric:        Mood and Affect: Mood normal.        Behavior: Behavior normal.        Thought Content: Thought content normal.        Judgment: Judgment normal.      Just to the is of dry gangrene.    CBC:    BMET Recent Labs    01/23/22 0732 01/24/22 0402  NA 135 134*  K 3.9 3.2*  CL 100 97*  CO2 22 23  GLUCOSE 132* 204*  BUN 29* 26*  CREATININE 1.45* 1.30*  CALCIUM 9.0 8.6*     Liver Panel  Recent Labs    01/23/22 0732 01/24/22 0402  PROT 7.2 7.1  ALBUMIN 3.2* 3.1*  AST  647* 454*  ALT 670* 607*  ALKPHOS 47 51  BILITOT 2.2* 1.3*       Sedimentation Rate No results for input(s): "ESRSEDRATE" in the last 72 hours. C-Reactive Protein No results for input(s): "CRP" in the last 72 hours.  Micro Results: Recent Results (from the past 720 hour(s))  Blood Culture (routine x 2)     Status: None (Preliminary result)   Collection Time: 01/20/22  1:29 AM   Specimen: BLOOD  Result Value Ref Range Status   Specimen Description BLOOD RAC  Final   Special Requests   Final    BOTTLES DRAWN AEROBIC AND ANAEROBIC Blood Culture adequate volume   Culture   Final    NO GROWTH 4 DAYS Performed at [facility], [address], [place], [place] [address]    Report Status PENDING  Incomplete  Blood Culture (routine x 2)     Status: None (Preliminary result)   Collection Time: 01/20/22  1:29 AM   Specimen: BLOOD  Result Value Ref Range Status   Specimen Description BLOOD RFOA  Final   Special Requests   Final    BOTTLES DRAWN AEROBIC AND ANAEROBIC Blood Culture adequate volume   Culture   Final    NO GROWTH 4 DAYS Performed at [facility], [address] [place], [place] [address]    Report Status PENDING  Incomplete  SARS Coronavirus 2 by RT PCR (hospital order, performed in [place] hospital lab) *cepheid single result test* Urine, Clean Catch     Status: None   Collection Time: 01/20/22  3:53 AM   Specimen: Urine, Clean Catch; Nasal Swab  Result Value Ref Range Status   SARS Coronavirus 2 by RT PCR NEGATIVE NEGATIVE Final    Comment: (NOTE) SARS-CoV-2 target nucleic acids are NOT DETECTED.  The SARS-CoV-2 RNA is generally detectable in upper and lower respiratory specimens during the acute phase of infection. The lowest concentration of SARS-CoV-2 viral copies this assay can detect is 250 copies / mL. A negative result does not preclude SARS-CoV-2 infection and should not be used as the sole basis for treatment or other patient management decisions.   A negative result may occur with improper specimen collection / handling, submission of specimen other than nasopharyngeal swab, presence of viral mutation(s) within the areas targeted by this assay, and inadequate number of viral copies (<250 copies / mL). A negative result must be combined with clinical observations, patient history, and epidemiological information.  Fact Sheet for Patients:   https://www.patel.info/  Fact Sheet for Healthcare Providers: https://hall.com/  This test is not yet approved or  cleared by the [place] FDA and has been authorized for detection and/or diagnosis of SARS-CoV-2 by FDA under an Emergency Use Authorization (EUA).  This EUA will remain in effect (meaning this test can be used) for the duration of the COVID-19 declaration under Section 564(b)(1) of the Act, 21 U.S.C. section 360bbb-3(b)(1), unless the authorization is terminated or revoked sooner.  Performed at [facility], [address], [place], [place] [address]   Urine Culture     Status: Abnormal   Collection Time: 01/20/22  3:57 AM   Specimen: In/Out Cath Urine  Result Value Ref Range Status   Specimen Description   Final    IN/OUT CATH URINE Performed at [facility] , [address], [place], [place] [address]    Special Requests  Final    NONE Performed at [facility], [address], [place], [place] [address]    Culture 3,000 COLONIES/mL ENTEROCOCCUS FAECALIS (A)  Final   Report Status 01/22/2022 FINAL  Final   Organism ID, Bacteria ENTEROCOCCUS FAECALIS (A)  Final      Susceptibility   Enterococcus faecalis - MIC*    AMPICILLIN <=2 SENSITIVE Sensitive     NITROFURANTOIN <=16 SENSITIVE Sensitive     VANCOMYCIN 2 SENSITIVE Sensitive     * 3,000 COLONIES/mL ENTEROCOCCUS FAECALIS  Blood culture (routine x 2)     Status: None (Preliminary result)   Collection Time: 01/21/22  9:30 PM   Specimen: BLOOD  Result Value Ref Range  Status   Specimen Description BLOOD BLOOD RIGHT ARM  Final   Special Requests   Final    BOTTLES DRAWN AEROBIC AND ANAEROBIC Blood Culture adequate volume   Culture   Final    NO GROWTH 3 DAYS Performed at [facility], [place] [address], [place], [place] [address]    Report Status PENDING  Incomplete  Resp Panel by RT-PCR (Flu A&B, Covid) Anterior Nasal Swab     Status: None   Collection Time: 01/21/22 11:04 PM   Specimen: Anterior Nasal Swab  Result Value Ref Range Status   SARS Coronavirus 2 by RT PCR NEGATIVE NEGATIVE Final    Comment: (NOTE) SARS-CoV-2 target nucleic acids are NOT DETECTED.  The SARS-CoV-2 RNA is generally detectable in upper respiratory specimens during the acute phase of infection. The lowest concentration of SARS-CoV-2 viral copies this assay can detect is 138 copies/mL. A negative result does not preclude SARS-Cov-2 infection and should not be used as the sole basis for treatment or other patient management decisions. A negative result may occur with  improper specimen collection/handling, submission of specimen other than nasopharyngeal swab, presence of viral mutation(s) within the areas targeted by this assay, and inadequate number of viral copies(<138 copies/mL). A negative result must be combined with clinical observations, patient history, and epidemiological information. The expected result is Negative.  Fact Sheet for Patients:  EntrepreneurPulse.com.au  Fact Sheet for Healthcare Providers:  IncredibleEmployment.be  This test is no t yet approved or cleared by the [place] FDA and  has been authorized for detection and/or diagnosis of SARS-CoV-2 by FDA under an Emergency Use Authorization (EUA). This EUA will remain  in effect (meaning this test can be used) for the duration of the COVID-19 declaration under Section 564(b)(1) of the Act, 21 U.S.C.section 360bbb-3(b)(1), unless the authorization is  terminated  or revoked sooner.       Influenza A by PCR NEGATIVE NEGATIVE Final   Influenza B by PCR NEGATIVE NEGATIVE Final    Comment: (NOTE) The Xpert Xpress SARS-CoV-2/FLU/RSV plus assay is intended as an aid in the diagnosis of influenza from Nasopharyngeal swab specimens and should not be used as a sole basis for treatment. Nasal washings and aspirates are unacceptable for Xpert Xpress SARS-CoV-2/FLU/RSV testing.  Fact Sheet for Patients: EntrepreneurPulse.com.au  Fact Sheet for Healthcare Providers: IncredibleEmployment.be  This test is not yet approved or cleared by the [place] FDA and has been authorized for detection and/or diagnosis of SARS-CoV-2 by FDA under an Emergency Use Authorization (EUA). This EUA will remain in effect (meaning this test can be used) for the duration of the COVID-19 declaration under Section 564(b)(1) of the Act, 21 U.S.C. section 360bbb-3(b)(1), unless the authorization is terminated or revoked.  Performed at [facility], [place]  [address], [place], [place] [address]   MRSA Next Gen by PCR, Nasal     Status: None   Collection Time: 01/22/22  8:30 PM   Specimen: Nasal Mucosa; Nasal Swab  Result Value Ref Range Status   MRSA by PCR Next Gen NOT DETECTED NOT DETECTED Final    Comment: (NOTE) The GeneXpert MRSA Assay (FDA approved for NASAL specimens only), is one component of a comprehensive MRSA colonization surveillance program. It is not intended to diagnose MRSA infection nor to guide or monitor treatment for MRSA infections. Test performance is not FDA approved in patients less than 31 years old. Performed at [facility], [place] [address] [place], [place] [address]     Studies/Results: MR BRAIN WO CONTRAST  Result Date: 01/24/2022 CLINICAL DATA:  Epidural abscess suspected.  Brain abscess. EXAM: MRI HEAD WITHOUT CONTRAST MRI CERVICAL SPINE WITHOUT CONTRAST TECHNIQUE: Multiplanar,  multiecho pulse sequences of the brain and surrounding structures, and cervical spine, to include the craniocervical junction and cervicothoracic junction, were obtained without intravenous contrast. COMPARISON:  Brain MRI 11/23/2020 FINDINGS: MRI HEAD FINDINGS Brain: Motion degraded but still overall diagnostic. Negative for infarct, hemorrhage, hydrocephalus, mass, or collection. No evidence of intracranial purulence. Generalized brain atrophy. Vascular: Abnormal left vertebral and skull base ICA flow voids, stable from 2022. Skull and upper cervical spine: No evidence of marrow lesion Sinuses/Orbits: No acute finding MRI CERVICAL SPINE FINDINGS Only motion degraded sagittal images could be obtained before the patient could no longer continue the study. STIR imaging is negative for edema or fracture. The spinal canal is patent without evidence of collection. ACDF at C5-6 and C6-7. IMPRESSION: Brain MRI: Motion degraded but diagnostic study that is negative for acute finding. No cerebral abscess. Cervical MRI: Motion degraded and truncated study due to patient condition. No acute finding in the cervical spine. Electronically Signed   By: Jorje Guild M.D.   On: 01/24/2022 11:57   MR CERVICAL SPINE WO CONTRAST  Result Date: 01/24/2022 CLINICAL DATA:  Epidural abscess suspected.  Brain abscess. EXAM: MRI HEAD WITHOUT CONTRAST MRI CERVICAL SPINE WITHOUT CONTRAST TECHNIQUE: Multiplanar, multiecho pulse sequences of the brain and surrounding structures, and cervical spine, to include the craniocervical junction and cervicothoracic junction, were obtained without intravenous contrast. COMPARISON:  Brain MRI 11/23/2020 FINDINGS: MRI HEAD FINDINGS Brain: Motion degraded but still overall diagnostic. Negative for infarct, hemorrhage, hydrocephalus, mass, or collection. No evidence of intracranial purulence. Generalized brain atrophy. Vascular: Abnormal left vertebral and skull base ICA flow voids, stable from 2022.  Skull and upper cervical spine: No evidence of marrow lesion Sinuses/Orbits: No acute finding MRI CERVICAL SPINE FINDINGS Only motion degraded sagittal images could be obtained before the patient could no longer continue the study. STIR imaging is negative for edema or fracture. The spinal canal is patent without evidence of collection. ACDF at C5-6 and C6-7. IMPRESSION: Brain MRI: Motion degraded but diagnostic study that is negative for acute finding. No cerebral abscess. Cervical MRI: Motion degraded and truncated study due to patient condition. No acute finding in the cervical spine. Electronically Signed   By: Jorje Guild M.D.   On: 01/24/2022 11:57      Assessment/Plan:  INTERVAL HISTORY: Motion degraded MRI of brain and C-spine was unremarkable   Principal Problem:   SIRS (systemic inflammatory response syndrome) (HCC) Active Problems:   Hyperlipidemia associated with type 2 diabetes mellitus (HCC)   CAD S/P percutaneous coronary angioplasty   GERD (gastroesophageal reflux disease)   AKI (acute kidney  injury) ([place])   Diabetes ([place])   Class II obesity   Anxiety   CHF (congestive heart failure) (HCC)   PAF (paroxysmal atrial fibrillation) (HCC)   PAOD (peripheral arterial occlusive disease) (HCC)   Coronary artery disease of native artery of native heart with stable angina pectoris (HCC)   Dry gangrene (HCC)   Elevated liver function tests   Nausea and vomiting   Choledocholithiasis   Goals of care, counseling/discussion   Lactic acidosis   Cholecystitis, acute    Mell A Mahaffy is a 71 y.o. male with history of coronary disease peripheral vascular disease that required multiple interventions, ACDF surgery recurrent group B streptococcus bacteremia thought to be originated from potentially his diet gangrene area in his left foot who also had a remote skull fracture and symptoms that sounded concerning for possible CSF leak who is now admitted with sepsis of unclear  origin.  #1 Sepsis:  Not entirely clear what the source of this was.  MRI of the brain is reassuring there is no intracranial infection such as a brain abscess.  He has had extensive work-up.  I have wanted to get an MRI of his left foot because he certainly could have osteomyelitis there is a potential source of the smoldering and recurring infection.  I also wanted to get MRI of the right foot.  Today however he is [place] against having further MRIs unless they are done "somewhere else not downstairs  His wife was at the bedside suggested that we revisit in the morning.  I think the patient is certainly stable at this point in time and can be switched over to oral Augmentin while in the inpatient setting.  #2 Recurrent group B streptococcal bacteremia: Source could indeed be the areas of dry gangrene and I have some concern about potential undiagnosed osteomyelitis see above.  In the absence of finding at treatable definable source of infection that can be removed I think he needs to have some Augmentin or amoxicillin on hand at home to build to take it for signs of infection.  If he does have a gross spinal fluid leak certainly has a history of skull fracture and symptoms that sound consistent with CSF leak though imaging does not show it that would be another reason for him to have Augmentin around to take at first sign of sinus infection.  #3 ? CSF leak: the imaging is  not compelling but much of his story could be  #4  Otitis and demand ischemia: All secondary to sepsis  I spent 52 minutes with the patient including than 50% of the time in face to face counseling of the patient personally reviewing brain and C-spine along with review of medical records in preparation for the visit and during the visit and in coordination of his care.    LOS: 3 days   Alcide Evener 01/24/2022, 3:53 PM

## 2022-01-25 DIAGNOSIS — K810 Acute cholecystitis: Secondary | ICD-10-CM | POA: Diagnosis not present

## 2022-01-25 DIAGNOSIS — K8050 Calculus of bile duct without cholangitis or cholecystitis without obstruction: Secondary | ICD-10-CM

## 2022-01-25 DIAGNOSIS — N179 Acute kidney failure, unspecified: Secondary | ICD-10-CM | POA: Diagnosis not present

## 2022-01-25 DIAGNOSIS — I5020 Unspecified systolic (congestive) heart failure: Secondary | ICD-10-CM

## 2022-01-25 DIAGNOSIS — I2510 Atherosclerotic heart disease of native coronary artery without angina pectoris: Secondary | ICD-10-CM | POA: Diagnosis not present

## 2022-01-25 DIAGNOSIS — F419 Anxiety disorder, unspecified: Secondary | ICD-10-CM | POA: Diagnosis not present

## 2022-01-25 DIAGNOSIS — I248 Other forms of acute ischemic heart disease: Secondary | ICD-10-CM

## 2022-01-25 DIAGNOSIS — R6510 Systemic inflammatory response syndrome (SIRS) of non-infectious origin without acute organ dysfunction: Secondary | ICD-10-CM | POA: Diagnosis not present

## 2022-01-25 DIAGNOSIS — K7200 Acute and subacute hepatic failure without coma: Secondary | ICD-10-CM

## 2022-01-25 DIAGNOSIS — G9600 Cerebrospinal fluid leak, unspecified: Secondary | ICD-10-CM

## 2022-01-25 LAB — CBC
HCT: 32.5 % — ABNORMAL LOW (ref 39.0–52.0)
Hemoglobin: 9.8 g/dL — ABNORMAL LOW (ref 13.0–17.0)
MCH: 23.6 pg — ABNORMAL LOW (ref 26.0–34.0)
MCHC: 30.2 g/dL (ref 30.0–36.0)
MCV: 78.3 fL — ABNORMAL LOW (ref 80.0–100.0)
Platelets: 305 10*3/uL (ref 150–400)
RBC: 4.15 MIL/uL — ABNORMAL LOW (ref 4.22–5.81)
RDW: 16.7 % — ABNORMAL HIGH (ref 11.5–15.5)
WBC: 9.7 10*3/uL (ref 4.0–10.5)
nRBC: 0.3 % — ABNORMAL HIGH (ref 0.0–0.2)

## 2022-01-25 LAB — COMPREHENSIVE METABOLIC PANEL
ALT: 635 U/L — ABNORMAL HIGH (ref 0–44)
AST: 474 U/L — ABNORMAL HIGH (ref 15–41)
Albumin: 3.1 g/dL — ABNORMAL LOW (ref 3.5–5.0)
Alkaline Phosphatase: 52 U/L (ref 38–126)
Anion gap: 9 (ref 5–15)
BUN: 18 mg/dL (ref 8–23)
CO2: 25 mmol/L (ref 22–32)
Calcium: 8.8 mg/dL — ABNORMAL LOW (ref 8.9–10.3)
Chloride: 102 mmol/L (ref 98–111)
Creatinine, Ser: 1.04 mg/dL (ref 0.61–1.24)
GFR, Estimated: >60 mL/min (ref >60)
Glucose, Bld: 182 mg/dL — ABNORMAL HIGH (ref 70–99)
Potassium: 3.5 mmol/L (ref 3.5–5.1)
Sodium: 136 mmol/L (ref 135–145)
Total Bilirubin: 1.1 mg/dL (ref 0.3–1.2)
Total Protein: 7 g/dL (ref 6.5–8.1)

## 2022-01-25 LAB — CULTURE, BLOOD (ROUTINE X 2)
Culture: NO GROWTH
Culture: NO GROWTH
Special Requests: ADEQUATE
Special Requests: ADEQUATE

## 2022-01-25 LAB — MAGNESIUM: Magnesium: 2.3 mg/dL (ref 1.7–2.4)

## 2022-01-25 LAB — GLUCOSE, CAPILLARY
Glucose-Capillary: 173 mg/dL — ABNORMAL HIGH (ref 70–99)
Glucose-Capillary: 237 mg/dL — ABNORMAL HIGH (ref 70–99)
Glucose-Capillary: 212 mg/dL — ABNORMAL HIGH (ref 70–99)
Glucose-Capillary: 235 mg/dL — ABNORMAL HIGH (ref 70–99)
Glucose-Capillary: 226 mg/dL — ABNORMAL HIGH (ref 70–99)

## 2022-01-25 LAB — PHOSPHORUS: Phosphorus: 1.8 mg/dL — ABNORMAL LOW (ref 2.5–4.6)

## 2022-01-25 LAB — HEPARIN LEVEL (UNFRACTIONATED): Heparin Unfractionated: 0.76 IU/mL — ABNORMAL HIGH (ref 0.30–0.70)

## 2022-01-25 LAB — APTT: aPTT: 74 seconds — ABNORMAL HIGH (ref 24–36)

## 2022-01-25 MED ORDER — POTASSIUM CHLORIDE CRYS ER 20 MEQ PO TBCR
40.0000 meq | EXTENDED_RELEASE_TABLET | Freq: Once | ORAL | Status: AC
  Administered 2022-01-25: 40 meq via ORAL
  Filled 2022-01-25: qty 2

## 2022-01-25 MED ORDER — LORAZEPAM 2 MG/ML IJ SOLN: 1.0000 mg | Freq: Once | INTRAMUSCULAR | Status: DC | PRN

## 2022-01-25 MED ORDER — K PHOS MONO-SOD PHOS DI & MONO 155-852-130 MG PO TABS
500.0000 mg | ORAL_TABLET | Freq: Four times a day (QID) | ORAL | Status: DC
  Administered 2022-01-25 – 2022-01-26 (×4): 500 mg via ORAL
  Filled 2022-01-25 (×7): qty 2

## 2022-01-25 MED ORDER — FUROSEMIDE 10 MG/ML IJ SOLN
40.0000 mg | Freq: Once | INTRAMUSCULAR | Status: AC
  Administered 2022-01-25: 40 mg via INTRAVENOUS
  Filled 2022-01-25: qty 4

## 2022-01-25 MED ORDER — INSULIN ASPART 100 UNIT/ML IJ SOLN
3.0000 [IU] | Freq: Three times a day (TID) | INTRAMUSCULAR | Status: DC
  Administered 2022-01-25 (×3): 3 [IU] via SUBCUTANEOUS

## 2022-01-25 MED ORDER — LOPERAMIDE HCL 2 MG PO CAPS
4.0000 mg | ORAL_CAPSULE | Freq: Once | ORAL | Status: AC
  Administered 2022-01-25: 4 mg via ORAL
  Filled 2022-01-25: qty 2

## 2022-01-25 MED ORDER — K PHOS MONO-SOD PHOS DI & MONO 155-852-130 MG PO TABS
250.0000 mg | ORAL_TABLET | Freq: Three times a day (TID) | ORAL | Status: DC
  Administered 2022-01-25: 250 mg via ORAL
  Filled 2022-01-25: qty 1

## 2022-01-25 MED ORDER — INSULIN DETEMIR 100 UNIT/ML ~~LOC~~ SOLN
15.0000 [IU] | Freq: Two times a day (BID) | SUBCUTANEOUS | Status: DC
  Administered 2022-01-25 (×2): 15 [IU] via SUBCUTANEOUS
  Filled 2022-01-25 (×3): qty 0.15

## 2022-01-25 NOTE — Progress Notes (Signed)
ANTICOAGULATION CONSULT NOTE  Pharmacy Consult for Heparin Indication: atrial fibrillation  Allergies  Allergen Reactions   Shellfish Allergy Anaphylaxis, Hives and Swelling    Tongue swelling    Sulfa Antibiotics Anaphylaxis, Hives and Swelling    Tongue swelling   Ace Inhibitors Other (See Comments) and Cough    CKD, renal failure    Invokana [Canagliflozin] Other (See Comments)    Syncope Dehydration    Lexapro [Escitalopram] Other (See Comments)    Buzzing in ears Headaches "Felt like a zombie"   Metformin And Related Itching   Pravachol [Pravastatin Sodium] Other (See Comments)    Myalgias    Repatha [Evolocumab] Other (See Comments)    Myalgias Flu like symptoms    Tricor [Fenofibrate] Other (See Comments)    Myalgias     Zestril [Lisinopril] Cough   Crestor [Rosuvastatin] Other (See Comments)    Myalgias    Horse-Derived Products Rash    horse serum   Lipitor [Atorvastatin] Other (See Comments)    Myalgias    Livalo [Pitavastatin] Other (See Comments)    Myalgias    Milk (Cow) Diarrhea and Nausea Only    Stomach upset    Tape Rash    Patient Measurements: Height: '6\' 2"'$  (188 cm) Weight: 129.4 kg (285 lb 4.4 oz) IBW/kg (Calculated) : 82.2 Heparin Dosing Weight: 109 kg  Vital Signs: Temp: 97.8 F (36.6 C) (09/10 1103) Temp Source: Oral (09/10 1103) BP: 106/64 (09/10 1103) Pulse Rate: 105 (09/10 0713)  Labs: Recent Labs    01/23/22 0732 01/23/22 1258 01/23/22 1753 01/24/22 0402 01/25/22 0115  HGB 9.8*  --   --  9.5* 9.8*  HCT 33.3*  --   --  32.2* 32.5*  PLT 273  --   --  302 305  APTT 60*  --  78* 73* 74*  HEPARINUNFRC >1.10*  --   --  >1.10* 0.76*  CREATININE 1.45*  --   --  1.30* 1.04  CKTOTAL 174  --   --   --   --   TROPONINIHS  --  157*  --   --   --      Estimated Creatinine Clearance: 93.2 mL/min (by C-G formula based on SCr of 1.04 mg/dL).   Medical History: Past Medical History:  Diagnosis Date   Anxiety    Arthritis     Atrial fibrillation (HCC)    CAD (coronary artery disease)    a. 2010: DES to CTO of RCA. EF 55% b. 07/2016: cath showing total occlusion within previously placed RCA stent (collaterals present), severe stenosis along LCx and OM1 (treated with 2 overlapping DES). c. repeat cath in 01/2018 showing patent stents along LCx and OM with CTO of D2, CTO of distal LCx, and CTO of RCA with collaterals present overall unchanged since 2018 with medical management recom   Cellulitis and abscess rt groin    Complication of anesthesia    " I woke up during a colonoscopy "      Depression    Diabetes mellitus    Diastolic CHF ([place])    Disorders of iron metabolism    Dysrhythmia    Fibromyalgia    GERD (gastroesophageal reflux disease)    History of hiatal hernia    Hyperlipidemia    Hypertension    Low serum testosterone level    Medically noncompliant    Myocardial infarction [facility])    05-23-20   Pneumonia     Medications:  See electronic med rec  Assessment: 71 y.o. M known to pharmacy from abx dosing for sepsis. Pt on Eliquis PTA for afib. Last dose 9/6 a.m. Holding currently in case procedures are needed. To start heparin gtt.   Heparin level elevated as expected at 0.76 on 9/10 with aPTT therapeutic at 74 (levels not correlating yet) with heaprin running at 1,950 units/hour. Hgb 9.8, plt 302 - CBC stable. No s/sx of bleeding or infusion issues.   Goal of Therapy:  Heparin level 0.3-0.7 units/ml, aPTT 66-102 sec Monitor platelets by anticoagulation protocol: Yes   Plan:  Cont heparin infusion at 1950 units/hr Daily heparin level, aPTT, and CBC Planning to resume Eliquis after all procedures done.  Adria Dill, PharmD PGY-2 Infectious Diseases Resident  01/25/2022 3:18 PM

## 2022-01-25 NOTE — Progress Notes (Signed)
Subjective:  No new complaints   Antibiotics:  Anti-infectives (From admission, onward)    Start     Dose/Rate Route Frequency Ordered Stop   01/24/22 1515  amoxicillin-clavulanate (AUGMENTIN) 875-125 MG per tablet 1 tablet        1 tablet Oral Every 12 hours 01/24/22 1421     01/24/22 0100  vancomycin (VANCOREADY) IVPB 1500 mg/300 mL  Status:  Discontinued        1,500 mg 150 mL/hr over 120 Minutes Intravenous Every 24 hours 01/23/22 1016 01/23/22 1511   01/23/22 2200  cefTRIAXone (ROCEPHIN) 2 g in sodium chloride 0.9 % 100 mL IVPB  Status:  Discontinued        2 g 200 mL/hr over 30 Minutes Intravenous Every 12 hours 01/23/22 1508 01/24/22 1421   01/23/22 1747  metroNIDAZOLE (FLAGYL) IVPB 500 mg  Status:  Discontinued        500 mg 100 mL/hr over 60 Minutes Intravenous Every 8 hours 01/23/22 1511 01/24/22 1421   01/23/22 1600  ceFEPIme (MAXIPIME) 2 g in sodium chloride 0.9 % 100 mL IVPB  Status:  Discontinued        2 g 200 mL/hr over 30 Minutes Intravenous Every 8 hours 01/23/22 1016 01/23/22 1508   01/22/22 2300  vancomycin (VANCOREADY) IVPB 1250 mg/250 mL  Status:  Discontinued        1,250 mg 166.7 mL/hr over 90 Minutes Intravenous Every 24 hours 01/21/22 2253 01/21/22 2305   01/22/22 2300  vancomycin (VANCOREADY) IVPB 1250 mg/250 mL  Status:  Discontinued        1,250 mg 166.7 mL/hr over 90 Minutes Intravenous Every 24 hours 01/21/22 2305 01/23/22 1016   01/22/22 1000  ceFEPIme (MAXIPIME) 2 g in sodium chloride 0.9 % 100 mL IVPB  Status:  Discontinued        2 g 200 mL/hr over 30 Minutes Intravenous Every 12 hours 01/21/22 2253 01/21/22 2305   01/22/22 1000  ceFEPIme (MAXIPIME) 2 g in sodium chloride 0.9 % 100 mL IVPB  Status:  Discontinued        2 g 200 mL/hr over 30 Minutes Intravenous Every 12 hours 01/21/22 2305 01/23/22 1016   01/22/22 0445  ceFEPIme (MAXIPIME) 2 g in sodium chloride 0.9 % 100 mL IVPB  Status:  Discontinued        2 g 200 mL/hr over 30  Minutes Intravenous  Once 01/22/22 0438 01/22/22 0444   01/22/22 0445  vancomycin (VANCOCIN) IVPB 1000 mg/200 mL premix  Status:  Discontinued        1,000 mg 200 mL/hr over 60 Minutes Intravenous  Once 01/22/22 0438 01/22/22 0445   01/22/22 0438  metroNIDAZOLE (FLAGYL) IVPB 500 mg  Status:  Discontinued        500 mg 100 mL/hr over 60 Minutes Intravenous Every 12 hours 01/22/22 0438 01/23/22 1511   01/21/22 2300  vancomycin (VANCOREADY) IVPB 2000 mg/400 mL        2,000 mg 200 mL/hr over 120 Minutes Intravenous  Once 01/21/22 2303 01/22/22 0220   01/21/22 2100  vancomycin (VANCOREADY) IVPB 2000 mg/400 mL  Status:  Discontinued        2,000 mg 200 mL/hr over 120 Minutes Intravenous  Once 01/21/22 2054 01/21/22 2303   01/21/22 2045  ceFEPIme (MAXIPIME) 2 g in sodium chloride 0.9 % 100 mL IVPB        2 g 200 mL/hr over 30 Minutes Intravenous  Once 01/21/22  2040 01/21/22 2234   01/21/22 2045  metroNIDAZOLE (FLAGYL) IVPB 500 mg        500 mg 100 mL/hr over 60 Minutes Intravenous  Once 01/21/22 2040 01/22/22 0032   01/21/22 2045  vancomycin (VANCOCIN) IVPB 1000 mg/200 mL premix  Status:  Discontinued        1,000 mg 200 mL/hr over 60 Minutes Intravenous  Once 01/21/22 2040 01/21/22 2054       Medications: Scheduled Meds:  amoxicillin-clavulanate  1 tablet Oral Q12H   DULoxetine  90 mg Oral Daily   ezetimibe  10 mg Oral Daily   gabapentin  300 mg Oral TID   insulin aspart  0-20 Units Subcutaneous TID WC   insulin aspart  0-5 Units Subcutaneous QHS   insulin aspart  3 Units Subcutaneous TID WC   insulin detemir  15 Units Subcutaneous BID   isosorbide mononitrate  30 mg Oral Daily   metoprolol succinate  50 mg Oral Daily   pantoprazole (PROTONIX) IV  40 mg Intravenous Q12H   phosphorus  250 mg Oral TID   trimethobenzamide  200 mg Intramuscular Q8H   Continuous Infusions:  heparin 1,950 Units/hr (01/24/22 2142)   PRN Meds:.acetaminophen **OR** acetaminophen, busPIRone,  HYDROcodone-acetaminophen, LORazepam, nitroGLYCERIN    Objective: Weight change:   Intake/Output Summary (Last 24 hours) at 01/25/2022 1134 Last data filed at 01/25/2022 0714 Gross per 24 hour  Intake 360 ml  Output 2500 ml  Net -2140 ml    Blood pressure 106/64, pulse (!) 105, temperature 97.8 F (36.6 C), temperature source Oral, resp. rate (!) 21, height '6\' 2"'$  (1.88 m), weight 129.4 kg, SpO2 97 %. Temp:  [97.7 F (36.5 C)-98.2 F (36.8 C)] 97.8 F (36.6 C) (09/10 1103) Pulse Rate:  [83-105] 105 (09/10 0713) Resp:  [18-21] 21 (09/10 0713) BP: (106-140)/(64-92) 106/64 (09/10 1103) SpO2:  [97 %-98 %] 97 % (09/10 1103)  Physical Exam: Physical Exam Constitutional:      Appearance: He is well-developed.  HENT:     Head: Normocephalic and atraumatic.  Eyes:     Conjunctiva/sclera: Conjunctivae normal.  Cardiovascular:     Rate and Rhythm: Normal rate and regular rhythm.  Pulmonary:     Effort: Pulmonary effort is normal. No respiratory distress.     Breath sounds: Normal breath sounds. No stridor. No wheezing.  Abdominal:     General: There is no distension.     Palpations: Abdomen is soft.  Musculoskeletal:        General: Normal range of motion.     Cervical back: Normal range of motion and neck supple.  Skin:    General: Skin is warm and dry.     Findings: No erythema or rash.  Neurological:     General: No focal deficit present.     Mental Status: He is alert and oriented to person, place, and time.  Psychiatric:        Mood and Affect: Mood normal.        Behavior: Behavior normal.        Thought Content: Thought content normal.        Judgment: Judgment normal.      Stable dry gangrene change    CBC:    BMET Recent Labs    01/24/22 0402 01/25/22 0115  NA 134* 136  K 3.2* 3.5  CL 97* 102  CO2 23 25  GLUCOSE 204* 182*  BUN 26* 18  CREATININE 1.30* 1.04  CALCIUM 8.6* 8.8*  Liver Panel  Recent Labs    01/24/22 0402  01/25/22 0115  PROT 7.1 7.0  ALBUMIN 3.1* 3.1*  AST 454* 474*  ALT 607* 635*  ALKPHOS 51 52  BILITOT 1.3* 1.1        Sedimentation Rate No results for input(s): "ESRSEDRATE" in the last 72 hours. C-Reactive Protein No results for input(s): "CRP" in the last 72 hours.  Micro Results: Recent Results (from the past 720 hour(s))  Blood Culture (routine x 2)     Status: None   Collection Time: 01/20/22  1:29 AM   Specimen: BLOOD  Result Value Ref Range Status   Specimen Description BLOOD RAC  Final   Special Requests   Final    BOTTLES DRAWN AEROBIC AND ANAEROBIC Blood Culture adequate volume   Culture   Final    NO GROWTH 5 DAYS Performed at [facility], [address] [place], [place] [address]    Report Status 01/25/2022 FINAL  Final  Blood Culture (routine x 2)     Status: None   Collection Time: 01/20/22  1:29 AM   Specimen: BLOOD  Result Value Ref Range Status   Specimen Description BLOOD RFOA  Final   Special Requests   Final    BOTTLES DRAWN AEROBIC AND ANAEROBIC Blood Culture adequate volume   Culture   Final    NO GROWTH 5 DAYS Performed at [facility], [address], [place], [place] [address]    Report Status 01/25/2022 FINAL  Final  SARS Coronavirus 2 by RT PCR (hospital order, performed in [facility] hospital lab) *cepheid single result test* Urine, Clean Catch     Status: None   Collection Time: 01/20/22  3:53 AM   Specimen: Urine, Clean Catch; Nasal Swab  Result Value Ref Range Status   SARS Coronavirus 2 by RT PCR NEGATIVE NEGATIVE Final    Comment: (NOTE) SARS-CoV-2 target nucleic acids are NOT DETECTED.  The SARS-CoV-2 RNA is generally detectable in upper and lower respiratory specimens during the acute phase of infection. The lowest concentration of SARS-CoV-2 viral copies this assay can detect is 250 copies / mL. A negative result does not preclude SARS-CoV-2 infection and should not be used as the sole basis for treatment or other patient  management decisions.  A negative result may occur with improper specimen collection / handling, submission of specimen other than nasopharyngeal swab, presence of viral mutation(s) within the areas targeted by this assay, and inadequate number of viral copies (<250 copies / mL). A negative result must be combined with clinical observations, patient history, and epidemiological information.  Fact Sheet for Patients:   https://www.patel.info/  Fact Sheet for Healthcare Providers: https://hall.com/  This test is not yet approved or  cleared by the [place] FDA and has been authorized for detection and/or diagnosis of SARS-CoV-2 by FDA under an Emergency Use Authorization (EUA).  This EUA will remain in effect (meaning this test can be used) for the duration of the COVID-19 declaration under Section 564(b)(1) of the Act, 21 U.S.C. section 360bbb-3(b)(1), unless the authorization is terminated or revoked sooner.  Performed at [facility], [address], [place], [place] [address]   Urine Culture     Status: Abnormal   Collection Time: 01/20/22  3:57 AM   Specimen: In/Out Cath Urine  Result Value Ref Range Status   Specimen Description   Final    IN/OUT CATH URINE Performed at [facility], [address], [place], [place] [address]  Special Requests   Final    NONE Performed at [facility], [address] [place], [place] [address]    Culture 3,000 COLONIES/mL ENTEROCOCCUS FAECALIS (A)  Final   Report Status 01/22/2022 FINAL  Final   Organism ID, Bacteria ENTEROCOCCUS FAECALIS (A)  Final      Susceptibility   Enterococcus faecalis - MIC*    AMPICILLIN <=2 SENSITIVE Sensitive     NITROFURANTOIN <=16 SENSITIVE Sensitive     VANCOMYCIN 2 SENSITIVE Sensitive     * 3,000 COLONIES/mL ENTEROCOCCUS FAECALIS  Blood culture (routine x 2)     Status: None (Preliminary result)   Collection Time: 01/21/22  9:30 PM   Specimen: BLOOD   Result Value Ref Range Status   Specimen Description BLOOD BLOOD RIGHT ARM  Final   Special Requests   Final    BOTTLES DRAWN AEROBIC AND ANAEROBIC Blood Culture adequate volume   Culture   Final    NO GROWTH 4 DAYS Performed at [facility], [place] [address] [place], [place] [address]    Report Status PENDING  Incomplete  Resp Panel by RT-PCR (Flu A&B, Covid) Anterior Nasal Swab     Status: None   Collection Time: 01/21/22 11:04 PM   Specimen: Anterior Nasal Swab  Result Value Ref Range Status   SARS Coronavirus 2 by RT PCR NEGATIVE NEGATIVE Final    Comment: (NOTE) SARS-CoV-2 target nucleic acids are NOT DETECTED.  The SARS-CoV-2 RNA is generally detectable in upper respiratory specimens during the acute phase of infection. The lowest concentration of SARS-CoV-2 viral copies this assay can detect is 138 copies/mL. A negative result does not preclude SARS-Cov-2 infection and should not be used as the sole basis for treatment or other patient management decisions. A negative result may occur with  improper specimen collection/handling, submission of specimen other than nasopharyngeal swab, presence of viral mutation(s) within the areas targeted by this assay, and inadequate number of viral copies(<138 copies/mL). A negative result must be combined with clinical observations, patient history, and epidemiological information. The expected result is Negative.  Fact Sheet for Patients:  EntrepreneurPulse.com.au  Fact Sheet for Healthcare Providers:  IncredibleEmployment.be  This test is no t yet approved or cleared by the [place] FDA and  has been authorized for detection and/or diagnosis of SARS-CoV-2 by FDA under an Emergency Use Authorization (EUA). This EUA will remain  in effect (meaning this test can be used) for the duration of the COVID-19 declaration under Section 564(b)(1) of the Act, 21 U.S.C.section 360bbb-3(b)(1),  unless the authorization is terminated  or revoked sooner.       Influenza A by PCR NEGATIVE NEGATIVE Final   Influenza B by PCR NEGATIVE NEGATIVE Final    Comment: (NOTE) The Xpert Xpress SARS-CoV-2/FLU/RSV plus assay is intended as an aid in the diagnosis of influenza from Nasopharyngeal swab specimens and should not be used as a sole basis for treatment. Nasal washings and aspirates are unacceptable for Xpert Xpress SARS-CoV-2/FLU/RSV testing.  Fact Sheet for Patients: EntrepreneurPulse.com.au  Fact Sheet for Healthcare Providers: IncredibleEmployment.be  This test is not yet approved or cleared by the [place] FDA and has been authorized for detection and/or diagnosis of SARS-CoV-2 by FDA under an Emergency Use Authorization (EUA). This EUA will remain in effect (meaning this test can be used) for the duration of the COVID-19 declaration under Section 564(b)(1) of the Act, 21 U.S.C. section 360bbb-3(b)(1), unless the authorization is terminated or revoked.  Performed at [facility], [place] [address] [place], [place] [address]   MRSA Next Gen by PCR, Nasal     Status: None   Collection Time: 01/22/22  8:30 PM   Specimen: Nasal Mucosa; Nasal Swab  Result Value Ref Range Status   MRSA by PCR Next Gen NOT DETECTED NOT DETECTED Final    Comment: (NOTE) The GeneXpert MRSA Assay (FDA approved for NASAL specimens only), is one component of a comprehensive MRSA colonization surveillance program. It is not intended to diagnose MRSA infection nor to guide or monitor treatment for MRSA infections. Test performance is not FDA approved in patients less than 66 years old. Performed at [facility], [place] [address], [place], [place] [address]     Studies/Results: MR BRAIN WO CONTRAST  Result Date: 01/24/2022 CLINICAL DATA:  Epidural abscess suspected.  Brain abscess. EXAM: MRI HEAD WITHOUT CONTRAST MRI CERVICAL SPINE WITHOUT  CONTRAST TECHNIQUE: Multiplanar, multiecho pulse sequences of the brain and surrounding structures, and cervical spine, to include the craniocervical junction and cervicothoracic junction, were obtained without intravenous contrast. COMPARISON:  Brain MRI 11/23/2020 FINDINGS: MRI HEAD FINDINGS Brain: Motion degraded but still overall diagnostic. Negative for infarct, hemorrhage, hydrocephalus, mass, or collection. No evidence of intracranial purulence. Generalized brain atrophy. Vascular: Abnormal left vertebral and skull base ICA flow voids, stable from 2022. Skull and upper cervical spine: No evidence of marrow lesion Sinuses/Orbits: No acute finding MRI CERVICAL SPINE FINDINGS Only motion degraded sagittal images could be obtained before the patient could no longer continue the study. STIR imaging is negative for edema or fracture. The spinal canal is patent without evidence of collection. ACDF at C5-6 and C6-7. IMPRESSION: Brain MRI: Motion degraded but diagnostic study that is negative for acute finding. No cerebral abscess. Cervical MRI: Motion degraded and truncated study due to patient condition. No acute finding in the cervical spine. Electronically Signed   By: Jorje Guild M.D.   On: 01/24/2022 11:57   MR CERVICAL SPINE WO CONTRAST  Result Date: 01/24/2022 CLINICAL DATA:  Epidural abscess suspected.  Brain abscess. EXAM: MRI HEAD WITHOUT CONTRAST MRI CERVICAL SPINE WITHOUT CONTRAST TECHNIQUE: Multiplanar, multiecho pulse sequences of the brain and surrounding structures, and cervical spine, to include the craniocervical junction and cervicothoracic junction, were obtained without intravenous contrast. COMPARISON:  Brain MRI 11/23/2020 FINDINGS: MRI HEAD FINDINGS Brain: Motion degraded but still overall diagnostic. Negative for infarct, hemorrhage, hydrocephalus, mass, or collection. No evidence of intracranial purulence. Generalized brain atrophy. Vascular: Abnormal left vertebral and skull base ICA  flow voids, stable from 2022. Skull and upper cervical spine: No evidence of marrow lesion Sinuses/Orbits: No acute finding MRI CERVICAL SPINE FINDINGS Only motion degraded sagittal images could be obtained before the patient could no longer continue the study. STIR imaging is negative for edema or fracture. The spinal canal is patent without evidence of collection. ACDF at C5-6 and C6-7. IMPRESSION: Brain MRI: Motion degraded but diagnostic study that is negative for acute finding. No cerebral abscess. Cervical MRI: Motion degraded and truncated study due to patient condition. No acute finding in the cervical spine. Electronically Signed   By: Jorje Guild M.D.   On: 01/24/2022 11:57      Assessment/Plan:  INTERVAL HISTORY: Patient now agreeable to MRI of left foot and right ankle   Principal Problem:   SIRS (systemic inflammatory response syndrome) (HCC) Active Problems:   Hyperlipidemia associated with type 2 diabetes mellitus (HCC)   CAD S/P percutaneous coronary angioplasty   GERD (gastroesophageal reflux  disease)   AKI (acute kidney injury) ([place])   Diabetes ([place])   Class II obesity   Anxiety   CHF (congestive heart failure) (HCC)   PAF (paroxysmal atrial fibrillation) (HCC)   PAOD (peripheral arterial occlusive disease) (HCC)   Coronary artery disease of native artery of native heart with stable angina pectoris (HCC)   Recurrent bacteremia   Dry gangrene (HCC)   Elevated liver function tests   Nausea and vomiting   Choledocholithiasis   Goals of care, counseling/discussion   Lactic acidosis   Cholecystitis, acute   Elevated lactic acid level   Generalized weakness    [name] [name] is a 71 y.o. male with history of coronary disease peripheral vascular disease that required multiple interventions, ACDF surgery recurrent group B streptococcus bacteremia thought to be originated from potentially his diet gangrene area in his left foot who also had a remote skull fracture and  symptoms that sounded concerning for possible CSF leak who is now admitted with sepsis of unclear origin.  #1 Sepsis:  Not entirely clear what the source of this was.  MRI of the brain is reassuring there is no intracranial infection such as a brain abscess.  He has had extensive work-up.  He will get MRI left foot with and without contrast and right ankle with and without contrast today.  Certainly if this shows evidence of osteomyelitis we will need to involve orthopedic surgery and consider what types of intervention can be performed in light of his vascular disease.  For now continue Augmentin  #2 Recurrent group B streptococcal bacteremia: Source could indeed be the areas of dry gangrene and I have some concern about potential undiagnosed osteomyelitis see above.  In the absence of finding at treatable definable source of infection that can be removed I think he needs to have some Augmentin or amoxicillin on hand at home to build to take it for signs of infection.  If he does have a gross spinal fluid leak certainly has a history of skull fracture and symptoms that sound consistent with CSF leak though imaging does not show it that would be another reason for him to have Augmentin around to take at first sign of sinus infection.  #3 ? CSF leak: the imaging is  not compelling but much of his story could be  #4  Hepatitis and demand ischemia: All secondary to sepsis  I spent 38 minutes with the patient including than 50% of the time in face to face counseling of the patient regarding his sepsis of unknown origin his recurrent group B streptococcus bacteremia,  along with review of medical records in preparation for the visit and during the visit and in coordination of his care.    Monroe Ellouise Newer has an appointment on  at 9/26/203 @ 1030 AM with Dr. West Bali  [facility] for Infectious Disease, which  is located in the [facility] at  [address] in  [place].  [address], which is located to the left of the elevators.  Phone: [phone]  Fax: [phone]  [email or web address]  The patient should arrive 30 minutes prior to their appoitment.     LOS: 4 days   Alcide Evener 01/25/2022, 11:34 AM

## 2022-01-25 NOTE — Progress Notes (Signed)
Progress Note  Patient: [name] [name] BTD:[id number] DOB: May 31, 1950  DOA: 01/21/2022  DOS: 01/25/2022    Brief hospital course: [name] [name] is a 71 y.o. male with a history of PVD, right foot toe gangrene, PAF on eliquis, CAD/ICM/chronic HFpEF, IDT2DM, recurrent streptococcal bacteremia, morbid obesity, HTN, HLD who presented to the ED at [facility] on 9/6 on the advice of ID due to subjective fever, chills, abdominal pain, nausea, vomiting and shortness of breath. He had been seen 9/5 in the [facility] for shortness of breath which resolved. Here he qualified for diagnosis of SIRS though unclear source, with mild AKI and LFT elevations. WBC 21k with left shift. Troponin was elevated and ECG revealed AFib with RBBB, nonspecific ST-T changes. CXR showed cardiomegaly with central vascular congestion. He was hypoxic.  CT A/P raises concern for gallbladder stones/sludge, questionable pericholecystic fat stranding, hepatomegaly, hepatic steatosis, small volume ascites and trace right pleural effusion.  RUQ [place] with cholelithiasis and mild gallbladder wall thickening without evidence of acute cholecystitis. HIDA scan was negative. Broad IV antibiotics were started and the patient admitted for SIRS. Troponin and LFTs trended upward, though the patient has no chest pain. Blood cultures have remained NGTD.   Assessment and Plan: SIRS: Recurrent GBS bacteremia, though Cx's this visit NGTD. Positive gallbladder findings on imaging though HIDA negative, not consistent with cholecystitis. There is dry gangrenous change to distal toes though this is improved from prior. Urine culture from ED 9/5 grew 3k of sensitive Enterococcus. Patient give limited history, but denies urinary symptoms.  - Narrowed abx ceftriaxone 2g dose and flagyl further to augmentin 9/9. D/w Dr. Tommy Medal, appreciate ID recommendations going forward. Has follow up 9/26 at 10:30am with Dr. [place]. - Monitor cultures regularly.  - With HA, neck pain, hx skull  fracture, MRI brain and C spine motion degraded studies do not reveal prominent abnormalities based on radiologist interpretation.  - After discussion with patient, he's amenable to MRI of left foot and right ankle to r/o osteo/deep infection. Ordered.  - Will plan on discharging with amox/augmentin po at discharge to have on hand given his recurrent GBS bacteremia. - WBC normalized.  Acute hypoxic respiratory failure, pleural effusion, pulmonary edema: VBG reassuring, pulse oximetry complicated by PVD. No PNA on CXR, doubt PE on eliquis.  - Improved with IV lasix 9/8, having crackles today, will repeat dose.  Troponin elevation, CAD, chronic combined HFrEF: Most recent echo showed LVEF 45-50%.  - Appreciate cardiology recommendations. Note significant CAD with no specific culprit lesion at last cath, troponin elevated here due to demand ischemia without angina.  - Continue heparin, beta blocker. Statin intolerant as below.   Uncontrolled IDT2DM with hyperglycemia: HbA1c 7.8%.  - Near inpatient goal despite giving drastically fewer units of insulin compared to home regimen (confirmed 60u of 70/30 BID and usually 15u humalog pen with meals, or more depending on sliding scale). Spouse states they eat quite calorie rich meals at home compared to here. Will increase levemir to 20u BID (pt requests unrestricted diet) and monitor closely. Add mealtime insulin, continue SSI.   Cholelithiasis: No evidence of acute cholecystitis by HIDA.   Nausea, vomiting: Stable - Scheduled antiemetic per GI. Tigan in lieu of other agents that may prolong QT interval.   Dysphagia: Worsening per pt/spouse.  - GI note suggests they will pursue endoscopic evaluation here, so I've held plavix (last dose 9/9 AM) in preparation. Giving solid diet today per patient insistence, though modifications can be made per GI  at their discretion.  Prolonged QT interval:  - Monitor on telemetry - Keep K/Mg replete.   Stage 2  pressure injury to bilateral buttocks, POA: Offload as able. Does not appear purulent.   Morbid obesity: Body mass index is 36.63 kg/m.   AKI on stage IIIa CKD: Suspected to be due to sepsis as it has improved. At first given IVF, then lasix x1. - DC'ed vancomycin. Monitor BMP. If Creatinine remains in current range, will need to update CKD severity. Creatinine improved again today. With crackles and fair CrCl, will redose lasix today as above.  PAF: Continues to be in AFib currently, though rate is controlled - Continue metoprolol, heparin. Once we know no LP or other procedures needed, will transition back to eliquis. Since he would likely be asked to hold anticoagulation if endoscopic evaluations were pursued as an outpatient, it would behoove use to expedite that work up to the inpatient setting while we have him on heparin.  Lactic acidosis: Improved.   GERD:  - Augmented PPI per GI.   Hypophosphatemia:  - Supplement again ordered today  Hypokalemia:  - Repeat K supplement today with concomitant lasix.  Subjective: Pt anxious about palpitations this morning which improved when metoprolol and buspar were given. Rate has remained controlled. He denies dyspnea or chest pain. He had a miserable experience in MRI yesterday and is reluctant to go back. Had a long and gregarious encounter today. Pt amenable to lower extremity MRIs.  Objective: Vitals:   01/24/22 2146 01/25/22 0018 01/25/22 0713 01/25/22 1103  BP:  128/81 (!) 136/92 106/64  Pulse: 90 83 (!) 105   Resp:  18 (!) 21   Temp: 98.2 F (36.8 C) 98 F (36.7 C) 97.7 F (36.5 C) 97.8 F (36.6 C)  TempSrc: Oral Oral Oral Oral  SpO2:    97%  Weight:      Height:       Gen: 71 y.o. male in no distress Pulm: Nonlabored, crackles at bases. CV: Irreg with rate in 90's without murmur, rub, or gallop. No JVD, trace dependent edema. GI: Abdomen soft, non-tender, non-distended, with normoactive bowel sounds.  Ext: Warm, dry,  diminished but present pulses.  Skin: No new rashes, lesions or ulcers on visualized skin. Stable dry gangrene distal left toes. Right ankle dressing c/d/I without erythema. Neuro: Alert and oriented. No focal neurological deficits. Psych: Judgement and insight appear fair. Mood euthymic & affect congruent. Behavior is appropriate.    Data Personally reviewed: CBC: Recent Labs  Lab 01/20/22 0129 01/21/22 1911 01/22/22 0450 01/22/22 1439 01/23/22 0732 01/24/22 0402 01/25/22 0115  WBC 10.4 20.9* 19.0*  --  12.4* 10.2 9.7  NEUTROABS 8.5* 18.1*  --   --  10.1*  --   --   HGB 11.1* 11.5* 10.4* 11.6* 9.8* 9.5* 9.8*  HCT 37.8* 38.2* 36.3* 34.0* 33.3* 32.2* 32.5*  MCV 78.8* 77.6* 80.5  --  80.2 79.3* 78.3*  PLT 301 348 303  --  273 302 629   Basic Metabolic Panel: Recent Labs  Lab 01/22/22 0450 01/22/22 1433 01/22/22 1439 01/23/22 0732 01/24/22 0402 01/25/22 0115  NA 133* 135 135 135 134* 136  K 4.3 4.2 4.1 3.9 3.2* 3.5  CL 98 101  --  100 97* 102  CO2 21* 22  --  '22 23 25  '$ GLUCOSE 173* 155*  --  132* 204* 182*  BUN 30* 29*  --  29* 26* 18  CREATININE 1.81* 1.68*  --  1.45* 1.30*  1.04  CALCIUM 8.9 8.6*  --  9.0 8.6* 8.8*  MG  --   --   --  2.2 2.2 2.3  PHOS  --   --   --  2.3* 2.1* 1.8*   GFR: Estimated Creatinine Clearance: 93.2 mL/min (by C-G formula based on SCr of 1.04 mg/dL). Liver Function Tests: Recent Labs  Lab 01/22/22 0450 01/22/22 1433 01/23/22 0732 01/24/22 0402 01/25/22 0115  AST 522* 1,516* 647* 454* 474*  ALT 318* 720* 670* 607* 635*  ALKPHOS 48 44 47 51 52  BILITOT 2.3* 1.8* 2.2* 1.3* 1.1  PROT 7.3 7.0 7.2 7.1 7.0  ALBUMIN 3.4* 3.2* 3.2* 3.1* 3.1*   Recent Labs  Lab 01/21/22 2130 01/23/22 0732  LIPASE 21 28   No results for input(s): "AMMONIA" in the last 168 hours. Coagulation Profile: Recent Labs  Lab 01/20/22 0129 01/21/22 1911 01/22/22 0450  INR 1.4* 2.0* 2.3*   Cardiac Enzymes: Recent Labs  Lab 01/22/22 0450 01/23/22 0732   CKTOTAL 180 174   BNP (last 3 results) No results for input(s): "PROBNP" in the last 8760 hours. HbA1C: No results for input(s): "HGBA1C" in the last 72 hours. CBG: Recent Labs  Lab 01/24/22 1141 01/24/22 1616 01/24/22 2107 01/25/22 0715 01/25/22 1101  GLUCAP 232* 264* 227* 173* 237*   Lipid Profile: Recent Labs    01/23/22 0732  CHOL 127  HDL 11*  LDLCALC 76  TRIG 199*  CHOLHDL 11.5   Thyroid Function Tests: No results for input(s): "TSH", "T4TOTAL", "FREET4", "T3FREE", "THYROIDAB" in the last 72 hours. Anemia Panel: No results for input(s): "VITAMINB12", "FOLATE", "FERRITIN", "TIBC", "IRON", "RETICCTPCT" in the last 72 hours. Urine analysis:    Component Value Date/Time   COLORURINE YELLOW 01/22/2022 0042   APPEARANCEUR CLEAR 01/22/2022 0042   APPEARANCEUR Clear 10/23/2021 1224   LABSPEC 1.030 01/22/2022 0042   PHURINE 6.0 01/22/2022 0042   GLUCOSEU >=500 (A) 01/22/2022 0042   HGBUR SMALL (A) 01/22/2022 0042   BILIRUBINUR NEGATIVE 01/22/2022 0042   BILIRUBINUR Negative 10/23/2021 1224   KETONESUR 5 (A) 01/22/2022 0042   PROTEINUR 100 (A) 01/22/2022 0042   UROBILINOGEN negative 08/13/2014 1725   UROBILINOGEN 0.2 08/07/2013 1747   NITRITE NEGATIVE 01/22/2022 0042   LEUKOCYTESUR NEGATIVE 01/22/2022 0042   Recent Results (from the past 240 hour(s))  Blood Culture (routine x 2)     Status: None   Collection Time: 01/20/22  1:29 AM   Specimen: BLOOD  Result Value Ref Range Status   Specimen Description BLOOD RAC  Final   Special Requests   Final    BOTTLES DRAWN AEROBIC AND ANAEROBIC Blood Culture adequate volume   Culture   Final    NO GROWTH 5 DAYS Performed at [facility], [address] [place], [place] [address]    Report Status 01/25/2022 FINAL  Final  Blood Culture (routine x 2)     Status: None   Collection Time: 01/20/22  1:29 AM   Specimen: BLOOD  Result Value Ref Range Status   Specimen Description BLOOD RFOA  Final   Special Requests    Final    BOTTLES DRAWN AEROBIC AND ANAEROBIC Blood Culture adequate volume   Culture   Final    NO GROWTH 5 DAYS Performed at [facility], [address] [place], [place] [address]    Report Status 01/25/2022 FINAL  Final  SARS Coronavirus 2 by RT PCR (hospital order, performed in [facility] hospital lab) *cepheid single result test* Urine, Clean Catch  Status: None   Collection Time: 01/20/22  3:53 AM   Specimen: Urine, Clean Catch; Nasal Swab  Result Value Ref Range Status   SARS Coronavirus 2 by RT PCR NEGATIVE NEGATIVE Final    Comment: (NOTE) SARS-CoV-2 target nucleic acids are NOT DETECTED.  The SARS-CoV-2 RNA is generally detectable in upper and lower respiratory specimens during the acute phase of infection. The lowest concentration of SARS-CoV-2 viral copies this assay can detect is 250 copies / mL. A negative result does not preclude SARS-CoV-2 infection and should not be used as the sole basis for treatment or other patient management decisions.  A negative result may occur with improper specimen collection / handling, submission of specimen other than nasopharyngeal swab, presence of viral mutation(s) within the areas targeted by this assay, and inadequate number of viral copies (<250 copies / mL). A negative result must be combined with clinical observations, patient history, and epidemiological information.  Fact Sheet for Patients:   https://www.patel.info/  Fact Sheet for Healthcare Providers: https://hall.com/  This test is not yet approved or  cleared by the [place] FDA and has been authorized for detection and/or diagnosis of SARS-CoV-2 by FDA under an Emergency Use Authorization (EUA).  This EUA will remain in effect (meaning this test can be used) for the duration of the COVID-19 declaration under Section 564(b)(1) of the Act, 21 U.S.C. section 360bbb-3(b)(1), unless the authorization is terminated  or revoked sooner.  Performed at [facility], [address], [place], [place] [address]   Urine Culture     Status: Abnormal   Collection Time: 01/20/22  3:57 AM   Specimen: In/Out Cath Urine  Result Value Ref Range Status   Specimen Description   Final    IN/OUT CATH URINE Performed at [facility], [address] [place], [place] [address]    Special Requests   Final    NONE Performed at [facility] [address], [place], [place] [address]    Culture 3,000 COLONIES/mL ENTEROCOCCUS FAECALIS (A)  Final   Report Status 01/22/2022 FINAL  Final   Organism ID, Bacteria ENTEROCOCCUS FAECALIS (A)  Final      Susceptibility   Enterococcus faecalis - MIC*    AMPICILLIN <=2 SENSITIVE Sensitive     NITROFURANTOIN <=16 SENSITIVE Sensitive     VANCOMYCIN 2 SENSITIVE Sensitive     * 3,000 COLONIES/mL ENTEROCOCCUS FAECALIS  Blood culture (routine x 2)     Status: None (Preliminary result)   Collection Time: 01/21/22  9:30 PM   Specimen: BLOOD  Result Value Ref Range Status   Specimen Description BLOOD BLOOD RIGHT ARM  Final   Special Requests   Final    BOTTLES DRAWN AEROBIC AND ANAEROBIC Blood Culture adequate volume   Culture   Final    NO GROWTH 4 DAYS Performed at [facility], [place] [address] [place], [place] [address]    Report Status PENDING  Incomplete  Resp Panel by RT-PCR (Flu A&B, Covid) Anterior Nasal Swab     Status: None   Collection Time: 01/21/22 11:04 PM   Specimen: Anterior Nasal Swab  Result Value Ref Range Status   SARS Coronavirus 2 by RT PCR NEGATIVE NEGATIVE Final    Comment: (NOTE) SARS-CoV-2 target nucleic acids are NOT DETECTED.  The SARS-CoV-2 RNA is generally detectable in upper respiratory specimens during the acute phase of infection. The lowest concentration of SARS-CoV-2 viral copies this assay can detect is 138 copies/mL. A negative result does not preclude  SARS-Cov-2 infection and should not be used as the sole basis for treatment  or other patient management decisions. A negative result may occur with  improper specimen collection/handling, submission of specimen other than nasopharyngeal swab, presence of viral mutation(s) within the areas targeted by this assay, and inadequate number of viral copies(<138 copies/mL). A negative result must be combined with clinical observations, patient history, and epidemiological information. The expected result is Negative.  Fact Sheet for Patients:  EntrepreneurPulse.com.au  Fact Sheet for Healthcare Providers:  IncredibleEmployment.be  This test is no t yet approved or cleared by the [place] FDA and  has been authorized for detection and/or diagnosis of SARS-CoV-2 by FDA under an Emergency Use Authorization (EUA). This EUA will remain  in effect (meaning this test can be used) for the duration of the COVID-19 declaration under Section 564(b)(1) of the Act, 21 U.S.C.section 360bbb-3(b)(1), unless the authorization is terminated  or revoked sooner.       Influenza A by PCR NEGATIVE NEGATIVE Final   Influenza B by PCR NEGATIVE NEGATIVE Final    Comment: (NOTE) The Xpert Xpress SARS-CoV-2/FLU/RSV plus assay is intended as an aid in the diagnosis of influenza from Nasopharyngeal swab specimens and should not be used as a sole basis for treatment. Nasal washings and aspirates are unacceptable for Xpert Xpress SARS-CoV-2/FLU/RSV testing.  Fact Sheet for Patients: EntrepreneurPulse.com.au  Fact Sheet for Healthcare Providers: IncredibleEmployment.be  This test is not yet approved or cleared by the [place] FDA and has been authorized for detection and/or diagnosis of SARS-CoV-2 by FDA under an Emergency Use Authorization (EUA). This EUA will remain in effect (meaning this test can be used) for the duration of the COVID-19 declaration under Section 564(b)(1) of the Act, 21 U.S.C. section  360bbb-3(b)(1), unless the authorization is terminated or revoked.  Performed at [facility], [place] [address], [place], [place] [address]   MRSA Next Gen by PCR, Nasal     Status: None   Collection Time: 01/22/22  8:30 PM   Specimen: Nasal Mucosa; Nasal Swab  Result Value Ref Range Status   MRSA by PCR Next Gen NOT DETECTED NOT DETECTED Final    Comment: (NOTE) The GeneXpert MRSA Assay (FDA approved for NASAL specimens only), is one component of a comprehensive MRSA colonization surveillance program. It is not intended to diagnose MRSA infection nor to guide or monitor treatment for MRSA infections. Test performance is not FDA approved in patients less than 17 years old. Performed at [facility], [place] [address], [place], [place] [address]      MR BRAIN WO CONTRAST  Result Date: 01/24/2022 CLINICAL DATA:  Epidural abscess suspected.  Brain abscess. EXAM: MRI HEAD WITHOUT CONTRAST MRI CERVICAL SPINE WITHOUT CONTRAST TECHNIQUE: Multiplanar, multiecho pulse sequences of the brain and surrounding structures, and cervical spine, to include the craniocervical junction and cervicothoracic junction, were obtained without intravenous contrast. COMPARISON:  Brain MRI 11/23/2020 FINDINGS: MRI HEAD FINDINGS Brain: Motion degraded but still overall diagnostic. Negative for infarct, hemorrhage, hydrocephalus, mass, or collection. No evidence of intracranial purulence. Generalized brain atrophy. Vascular: Abnormal left vertebral and skull base ICA flow voids, stable from 2022. Skull and upper cervical spine: No evidence of marrow lesion Sinuses/Orbits: No acute finding MRI CERVICAL SPINE FINDINGS Only motion degraded sagittal images could be obtained before the patient could no longer continue the study. STIR imaging is negative for edema or fracture. The spinal canal is patent without evidence of collection. ACDF at C5-6 and  C6-7. IMPRESSION: Brain MRI: Motion degraded but diagnostic study that  is negative for acute finding. No cerebral abscess. Cervical MRI: Motion degraded and truncated study due to patient condition. No acute finding in the cervical spine. Electronically Signed   By: Jorje Guild M.D.   On: 01/24/2022 11:57   MR CERVICAL SPINE WO CONTRAST  Result Date: 01/24/2022 CLINICAL DATA:  Epidural abscess suspected.  Brain abscess. EXAM: MRI HEAD WITHOUT CONTRAST MRI CERVICAL SPINE WITHOUT CONTRAST TECHNIQUE: Multiplanar, multiecho pulse sequences of the brain and surrounding structures, and cervical spine, to include the craniocervical junction and cervicothoracic junction, were obtained without intravenous contrast. COMPARISON:  Brain MRI 11/23/2020 FINDINGS: MRI HEAD FINDINGS Brain: Motion degraded but still overall diagnostic. Negative for infarct, hemorrhage, hydrocephalus, mass, or collection. No evidence of intracranial purulence. Generalized brain atrophy. Vascular: Abnormal left vertebral and skull base ICA flow voids, stable from 2022. Skull and upper cervical spine: No evidence of marrow lesion Sinuses/Orbits: No acute finding MRI CERVICAL SPINE FINDINGS Only motion degraded sagittal images could be obtained before the patient could no longer continue the study. STIR imaging is negative for edema or fracture. The spinal canal is patent without evidence of collection. ACDF at C5-6 and C6-7. IMPRESSION: Brain MRI: Motion degraded but diagnostic study that is negative for acute finding. No cerebral abscess. Cervical MRI: Motion degraded and truncated study due to patient condition. No acute finding in the cervical spine. Electronically Signed   By: Jorje Guild M.D.   On: 01/24/2022 11:57    Family Communication: Spouse at bedside  Disposition: Status is: Inpatient Remains inpatient appropriate because: Ongoing work up and IV antibiotics  Planned Discharge Destination: Home   Patrecia Pour, MD 01/25/2022 11:40 AM Page by [email or web address]

## 2022-01-26 DIAGNOSIS — R6510 Systemic inflammatory response syndrome (SIRS) of non-infectious origin without acute organ dysfunction: Secondary | ICD-10-CM | POA: Diagnosis not present

## 2022-01-26 LAB — CBC
HCT: 33.6 % — ABNORMAL LOW (ref 39.0–52.0)
Hemoglobin: 9.7 g/dL — ABNORMAL LOW (ref 13.0–17.0)
MCH: 23.3 pg — ABNORMAL LOW (ref 26.0–34.0)
MCHC: 28.9 g/dL — ABNORMAL LOW (ref 30.0–36.0)
MCV: 80.6 fL (ref 80.0–100.0)
Platelets: 322 10*3/uL (ref 150–400)
RBC: 4.17 MIL/uL — ABNORMAL LOW (ref 4.22–5.81)
RDW: 17.1 % — ABNORMAL HIGH (ref 11.5–15.5)
WBC: 11.6 10*3/uL — ABNORMAL HIGH (ref 4.0–10.5)
nRBC: 0.3 % — ABNORMAL HIGH (ref 0.0–0.2)

## 2022-01-26 LAB — APTT: aPTT: 65 seconds — ABNORMAL HIGH (ref 24–36)

## 2022-01-26 LAB — COMPREHENSIVE METABOLIC PANEL
ALT: 445 U/L — ABNORMAL HIGH (ref 0–44)
AST: 152 U/L — ABNORMAL HIGH (ref 15–41)
Albumin: 3.1 g/dL — ABNORMAL LOW (ref 3.5–5.0)
Alkaline Phosphatase: 54 U/L (ref 38–126)
Anion gap: 12 (ref 5–15)
BUN: 15 mg/dL (ref 8–23)
CO2: 23 mmol/L (ref 22–32)
Calcium: 9.1 mg/dL (ref 8.9–10.3)
Chloride: 98 mmol/L (ref 98–111)
Creatinine, Ser: 1.01 mg/dL (ref 0.61–1.24)
GFR, Estimated: >60 mL/min (ref >60)
Glucose, Bld: 240 mg/dL — ABNORMAL HIGH (ref 70–99)
Potassium: 3.8 mmol/L (ref 3.5–5.1)
Sodium: 133 mmol/L — ABNORMAL LOW (ref 135–145)
Total Bilirubin: 0.9 mg/dL (ref 0.3–1.2)
Total Protein: 7.1 g/dL (ref 6.5–8.1)

## 2022-01-26 LAB — GLUCOSE, CAPILLARY
Glucose-Capillary: 226 mg/dL — ABNORMAL HIGH (ref 70–99)
Glucose-Capillary: 199 mg/dL — ABNORMAL HIGH (ref 70–99)

## 2022-01-26 LAB — PHOSPHORUS: Phosphorus: 2 mg/dL — ABNORMAL LOW (ref 2.5–4.6)

## 2022-01-26 LAB — CULTURE, BLOOD (ROUTINE X 2)
Culture: NO GROWTH
Special Requests: ADEQUATE

## 2022-01-26 LAB — MAGNESIUM: Magnesium: 2.2 mg/dL (ref 1.7–2.4)

## 2022-01-26 LAB — HEPARIN LEVEL (UNFRACTIONATED): Heparin Unfractionated: 0.27 IU/mL — ABNORMAL LOW (ref 0.30–0.70)

## 2022-01-26 MED ORDER — INSULIN DETEMIR 100 UNIT/ML ~~LOC~~ SOLN
25.0000 [IU] | Freq: Two times a day (BID) | SUBCUTANEOUS | Status: DC
  Administered 2022-01-26: 25 [IU] via SUBCUTANEOUS
  Filled 2022-01-26 (×2): qty 0.25

## 2022-01-26 MED ORDER — PANTOPRAZOLE SODIUM 40 MG PO TBEC: 40.0000 mg | DELAYED_RELEASE_TABLET | Freq: Two times a day (BID) | ORAL | Status: DC

## 2022-01-26 MED ORDER — PANTOPRAZOLE SODIUM 40 MG PO TBEC: 40.0000 mg | DELAYED_RELEASE_TABLET | Freq: Two times a day (BID) | ORAL | 0 refills | Status: DC

## 2022-01-26 MED ORDER — INSULIN ASPART 100 UNIT/ML IJ SOLN
8.0000 [IU] | Freq: Three times a day (TID) | INTRAMUSCULAR | Status: DC
  Administered 2022-01-26: 8 [IU] via SUBCUTANEOUS

## 2022-01-26 MED ORDER — INSULIN ASPART 100 UNIT/ML IJ SOLN: 6.0000 [IU] | Freq: Three times a day (TID) | INTRAMUSCULAR | Status: DC

## 2022-01-26 MED ORDER — AMOXICILLIN-POT CLAVULANATE 875-125 MG PO TABS: 1.0000 | ORAL_TABLET | Freq: Two times a day (BID) | ORAL | 0 refills | Status: DC

## 2022-01-26 NOTE — Progress Notes (Addendum)
ANTICOAGULATION CONSULT NOTE  Pharmacy Consult for Heparin Indication: atrial fibrillation  Allergies  Allergen Reactions   Shellfish Allergy Anaphylaxis, Hives and Swelling    Tongue swelling    Sulfa Antibiotics Anaphylaxis, Hives and Swelling    Tongue swelling   Ace Inhibitors Other (See Comments) and Cough    CKD, renal failure    Invokana [Canagliflozin] Other (See Comments)    Syncope Dehydration    Lexapro [Escitalopram] Other (See Comments)    Buzzing in ears Headaches "Felt like a zombie"   Metformin And Related Itching   Pravachol [Pravastatin Sodium] Other (See Comments)    Myalgias    Repatha [Evolocumab] Other (See Comments)    Myalgias Flu like symptoms    Tricor [Fenofibrate] Other (See Comments)    Myalgias     Zestril [Lisinopril] Cough   Crestor [Rosuvastatin] Other (See Comments)    Myalgias    Horse-Derived Products Rash    horse serum   Lipitor [Atorvastatin] Other (See Comments)    Myalgias    Livalo [Pitavastatin] Other (See Comments)    Myalgias    Milk (Cow) Diarrhea and Nausea Only    Stomach upset    Tape Rash    Patient Measurements: Height: '6\' 2"'$  (188 cm) Weight: 129.4 kg (285 lb 4.4 oz) IBW/kg (Calculated) : 82.2 Heparin Dosing Weight: 109 kg  Vital Signs: Temp: 98.5 F (36.9 C) (09/11 0833) Temp Source: Oral (09/11 0833) BP: 128/90 (09/11 0833) Pulse Rate: 105 (09/11 0738)  Labs: Recent Labs    01/23/22 1258 01/23/22 1753 01/24/22 0402 01/25/22 0115 01/26/22 0551  HGB  --    < > 9.5* 9.8* 9.7*  HCT  --   --  32.2* 32.5* 33.6*  PLT  --   --  302 305 322  APTT  --    < > 73* 74* 65*  HEPARINUNFRC  --   --  >1.10* 0.76* 0.27*  CREATININE  --   --  1.30* 1.04 1.01  TROPONINIHS 157*  --   --   --   --    < > = values in this interval not displayed.    Estimated Creatinine Clearance: 95.9 mL/min (by C-G formula based on SCr of 1.01 mg/dL).   Medical History: Past Medical History:  Diagnosis Date   Anxiety     Arthritis    Atrial fibrillation (HCC)    CAD (coronary artery disease)    a. 2010: DES to CTO of RCA. EF 55% b. 07/2016: cath showing total occlusion within previously placed RCA stent (collaterals present), severe stenosis along LCx and OM1 (treated with 2 overlapping DES). c. repeat cath in 01/2018 showing patent stents along LCx and OM with CTO of D2, CTO of distal LCx, and CTO of RCA with collaterals present overall unchanged since 2018 with medical management recom   Cellulitis and abscess rt groin    Complication of anesthesia    " I woke up during a colonoscopy "      Depression    Diabetes mellitus    Diastolic CHF ([address])    Disorders of iron metabolism    Dysrhythmia    Fibromyalgia    GERD (gastroesophageal reflux disease)    History of hiatal hernia    Hyperlipidemia    Hypertension    Low serum testosterone level    Medically noncompliant    Myocardial infarction [facility])    05-23-20   Pneumonia     Medications:  See electronic med rec  Assessment: 71 years of age male on Eliquis prior to admission for atrial fibrillation. Last dose 9/6 AM. Holding Eliquis currently in case procedures are needed. Pharmacy consulted for IV heparin.  Heparin level down to 0.27 this AM. aPTT correlating at 65. Will plan to use Heparin levels going forward.  CBC stable. No s/sx of bleeding or infusion issues.   Goal of Therapy:  Heparin level 0.3-0.7 units/ml Monitor platelets by anticoagulation protocol: Yes   Plan:  Increase heparin infusion at 2050 units/hr Recheck anti-Xa level in ~8 hours. Daily anti-Xa level and CBC *Note possible plan for EGD -- will need to hold heparin prior. Follow-up scheduling.  Planning to resume Eliquis after all procedures done.  Sloan Leiter, PharmD, BCPS, BCCCP Clinical Pharmacist Please refer to [facility] for [place] numbers 01/26/2022 10:31 AM

## 2022-01-26 NOTE — Discharge Summary (Signed)
Physician Discharge Summary   Patient: [name] [name] MRN: [id number] DOB: [birth year]  Admit date:     01/21/2022  Discharge date: 01/26/22  Discharge Physician: Patrecia Pour   PCP: [name] Balloon, FNP   Recommendations at discharge:  Follow up with PCP 9/25 for chronic disease management. Suggest recheck CMP, magnesium, phosphorus levels and CBC.  Follow up with ID, Dr. West Bali as arranged 9/26. Note readmission with sepsis though unclear source at this time. Due to recurrent bacteremia, ongoing dry gangrene/wounds, ID has recommended augmentin to be taken prn fever at home which is prescribed at discharge. Cardiology will arrange follow up. Follow up with outpatient GI for recommended EGD and colonoscopy.  Discharge Diagnoses: Principal Problem:   SIRS (systemic inflammatory response syndrome) (HCC) Active Problems:   Hyperlipidemia associated with type 2 diabetes mellitus (HCC)   CAD S/P percutaneous coronary angioplasty   GERD (gastroesophageal reflux disease)   AKI (acute kidney injury) (HCC)   Diabetes (HCC)   Class II obesity   Anxiety   CHF (congestive heart failure) (HCC)   PAF (paroxysmal atrial fibrillation) (HCC)   PAOD (peripheral arterial occlusive disease) (HCC)   Coronary artery disease of native artery of native heart with stable angina pectoris (HCC)   Recurrent bacteremia   Dry gangrene (HCC)   Elevated liver function tests   Nausea and vomiting   Choledocholithiasis   Goals of care, counseling/discussion   Lactic acidosis   Cholecystitis, acute   Elevated lactic acid level   Generalized weakness  Hospital Course: [name] [name] is a 71 y.o. male with a history of PVD, right foot toe gangrene, PAF on eliquis, CAD/ICM/chronic HFpEF, IDT2DM, recurrent streptococcal bacteremia, morbid obesity, HTN, HLD who presented to the ED at [facility] on 9/6 on the advice of ID due to subjective fever, chills, abdominal pain, nausea, vomiting and shortness of breath. He had been  seen 9/5 in the [facility] for shortness of breath which resolved. Here he qualified for diagnosis of SIRS though unclear source, with mild AKI and LFT elevations. WBC 21k with left shift. Troponin was elevated and ECG revealed AFib with RBBB, nonspecific ST-T changes. CXR showed cardiomegaly with central vascular congestion. He was hypoxic.  CT A/P raises concern for gallbladder stones/sludge, questionable pericholecystic fat stranding, hepatomegaly, hepatic steatosis, small volume ascites and trace right pleural effusion.  RUQ [place] with cholelithiasis and mild gallbladder wall thickening without evidence of acute cholecystitis. HIDA scan was negative. Broad IV antibiotics were started and the patient admitted for SIRS. Troponin and LFTs trended upward, though the patient has no chest pain. Blood cultures have remained NGTD. Please see assess-ment based plan as detailed below.  Assessment and Plan: SIRS: Recurrent GBS bacteremia, though Cx's this visit NGTD. Positive gallbladder findings on imaging though HIDA negative, not consistent with cholecystitis. There is dry gangrenous change to distal toes though this is improved from prior. Urine culture from ED 9/5 grew 3k of sensitive Enterococcus. Patient give limited history, but denies urinary symptoms.  - Do to lack of source, we cannot call this sepsis (ruled out). - Cultures remain NGTD. Tmax was on arrival at 99.60F, afebrile since.  - Narrowed abx ceftriaxone 2g dose and flagyl further to augmentin 9/9. D/w Dr. Linus Salmons today, Dr. Tommy Medal over weekend. They are comfortable with discharge today. Has follow up 9/26 at 10:30am with Dr. West Bali. - With HA, neck pain, hx skull fracture, MRI brain and C spine motion degraded studies do not reveal prominent abnormalities based on  radiologist interpretation.  - ID does not believe MRI of right ankle and left foot/toes will be high yield, patient wishes to avoid MRI if at all possible, so this is not pursued.   - Will  plan on discharging with augmentin po at discharge to have on hand given his recurrent GBS bacteremia. - WBC normalized.   Acute hypoxic respiratory failure, pleural effusion, pulmonary edema: VBG reassuring, pulse oximetry complicated by PVD. No PNA on CXR, doubt PE on eliquis.  - Resolved.    Troponin elevation, CAD, chronic combined HFrEF: Most recent echo showed LVEF 45-50%.  - Appreciate cardiology recommendations. Note significant CAD with no specific culprit lesion at last cath, troponin elevated here due to demand ischemia without angina.  - Continue home medications and cardiology follow up.   Uncontrolled IDT2DM with hyperglycemia: HbA1c 7.8%.  - Return to home regimen, consider traditional basal-bolus dosing in lieu of 70/30 and humalog to avoid hypoglycemia. Note CBGs fairly controlled on drastically reduced regimen while patient on restricted diet here, but starting to rise with return to usual eating habits. PCP follow up recommended.    Cholelithiasis: No evidence of acute cholecystitis by HIDA.    Nausea, vomiting: Resolved.   Dysphagia: Worsening per pt/spouse.  - GI continues to recommend endoscopic evaluation which the patient adamantly declines inpatient work up.     Prolonged QT interval: - Keep K/Mg replete. , minimize provocative agents.    Stage 2 pressure injury to bilateral buttocks, POA: Offload as able. Does not appear purulent.    Morbid obesity: Body mass index is 36.63 kg/m.    AKI: Suspected to be due to sepsis as it has improved. SCr up to 1.8 at admission. At first given IVF, then lasix. CrCl at discharge is actually >38m/min so will adjust diagnosis to AKI without CKD (previously suspected CKD IIIa) - Monitor BMP.     PAF: Continues to be in AFib currently, though rate is controlled - Continue metoprolol, anticoagulation.    Lactic acidosis: Improved.    GERD:  - Augmented PPI per GI.    Hypophosphatemia:  - Supplemented, anticipate  improvement with continued improve po intake. Not at risk for refeeding syndrome. Suggest recheck at PCP follow up.    Hypokalemia:  - Resolved.  Consultants: GI, cardiology, ID Procedures performed: None  Disposition: Home Diet recommendation:  Cardiac and Carb modified diet DISCHARGE MEDICATION: Allergies as of 01/26/2022       Reactions   Shellfish Allergy Anaphylaxis, Hives, Swelling   Tongue swelling   Sulfa Antibiotics Anaphylaxis, Hives, Swelling   Tongue swelling   Ace Inhibitors Other (See Comments), Cough   CKD, renal failure    Invokana [canagliflozin] Other (See Comments)   Syncope Dehydration    Lexapro [escitalopram] Other (See Comments)   Buzzing in ears Headaches "Felt like a zombie"   Metformin And Related Itching   Pravachol [pravastatin Sodium] Other (See Comments)   Myalgias    Repatha [evolocumab] Other (See Comments)   Myalgias Flu like symptoms   Tricor [fenofibrate] Other (See Comments)   Myalgias    Zestril [lisinopril] Cough   Crestor [rosuvastatin] Other (See Comments)   Myalgias   Horse-derived Products Rash   horse serum   Lipitor [atorvastatin] Other (See Comments)   Myalgias    Livalo [pitavastatin] Other (See Comments)   Myalgias   Milk (cow) Diarrhea, Nausea Only   Stomach upset   Tape Rash        Medication List  STOP taking these medications    doxycycline 100 MG capsule Commonly known as: VIBRAMYCIN       TAKE these medications    acetaminophen 325 MG tablet Commonly known as: TYLENOL Take 2 tablets (650 mg total) by mouth every 6 (six) hours as needed for mild pain (or Fever >/= 101).   albuterol 108 (90 Base) MCG/ACT inhaler Commonly known as: VENTOLIN HFA Inhale 2 puffs into the lungs every 6 (six) hours as needed for wheezing or shortness of breath.   ALKA-SELTZER ANTACID PO Take 1 tablet by mouth 2 (two) times daily as needed (heartburn, indigestion).   amoxicillin-clavulanate 875-125 MG  tablet Commonly known as: AUGMENTIN Take 1 tablet by mouth every 12 (twelve) hours. if fever >100.21F   BLUE-EMU MAXIMUM STRENGTH EX Apply 1 application. topically daily as needed (pain).   busPIRone 7.5 MG tablet Commonly known as: BUSPAR Take 1 tablet (7.5 mg total) by mouth 3 (three) times daily as needed. What changed: reasons to take this   clopidogrel 75 MG tablet Commonly known as: PLAVIX TAKE 1 TABLET BY MOUTH DAILY   cyanocobalamin 1000 MCG tablet Commonly known as: VITAMIN B12 Take 1,000 mcg by mouth daily.   dapagliflozin propanediol 5 MG Tabs tablet Commonly known as: Farxiga Take 1 tablet (5 mg total) by mouth daily before breakfast.   DULoxetine 30 MG capsule Commonly known as: CYMBALTA Take 3 capsules (90 mg total) by mouth daily. What changed: when to take this   Eliquis 5 MG Tabs tablet Generic drug: apixaban TAKE ONE (1) TABLET BY MOUTH TWICE DAILY What changed: See the new instructions.   ezetimibe 10 MG tablet Commonly known as: ZETIA Take 1 tablet (10 mg total) by mouth daily.   fenofibrate 160 MG tablet Take 1 tablet (160 mg total) by mouth daily.   furosemide 20 MG tablet Commonly known as: LASIX Take 20 mg by mouth daily.   gabapentin 300 MG capsule Commonly known as: NEURONTIN TAKE 1 CAPSULE BY MOUTH 3 TIMES DAILY   HumuLIN 70/30 (70-30) 100 UNIT/ML injection Generic drug: insulin NPH-regular Human Inject 60 Units into the skin 2 (two) times daily with a meal.   HYDROcodone-acetaminophen 10-325 MG tablet Commonly known as: NORCO Take 1 tablet by mouth every 4 (four) hours as needed for moderate pain. What changed:  when to take this reasons to take this   insulin lispro 100 UNIT/ML KwikPen Commonly known as: HUMALOG INJECT 15-20 UNITS SUBCUTANEOUSLY THREE TIMES A DAY WITH MEALS What changed: See the new instructions.   isosorbide mononitrate 30 MG 24 hr tablet Commonly known as: IMDUR TAKE 1 TABLET BY MOUTH DAILY    metoprolol succinate 50 MG 24 hr tablet Commonly known as: TOPROL-XL Take 1 tablet by mouth once daily   metoprolol tartrate 50 MG tablet Commonly known as: LOPRESSOR Take 50 mg by mouth daily as needed.   Mounjaro 7.5 MG/0.5ML Pen Generic drug: tirzepatide Inject 7.5 mg into the skin once a week. What changed: when to take this   nitroGLYCERIN 0.4 MG SL tablet Commonly known as: NITROSTAT DISSOLVE ONE TABLET UNDER THE TONGUE EVERY 5 MINUTES AS NEEDED FOR CHEST PAIN.  DO NOT EXCEED A TOTAL OF 3 DOSES IN 15 MINUTES Strength: 0.4 mg What changed:  how much to take how to take this when to take this reasons to take this additional instructions   pantoprazole 40 MG tablet Commonly known as: PROTONIX Take 1 tablet (40 mg total) by mouth 2 (two) times daily. What  changed: when to take this        Follow-up Information     Josue Hector, MD Follow up.   Specialty: Cardiology Why: Our office will call you to schedule a follow-up visit with Cardiology. If you do not hear from [place] within 2 business days, please call the office to schedule this. Contact information: [address] [address] [phone]         [name] Balloon, FNP Follow up on 02/09/2022.   Specialty: Family Medicine Why: apt already on this day Contact information: [name] [address] [phone]         Rosiland Oz, MD Follow up on 02/10/2022.   Specialty: Infectious Diseases Contact information: [address] [place] [address] [phone]                Discharge Exam: Danley Danker Weights   01/21/22 1840 01/22/22 2031  Weight: 122 kg 129.4 kg  BP 127/87 (BP Location: Right Wrist)   Pulse (!) 105   Temp 98.5 F (36.9 C) (Oral)   Resp 20   Ht '6\' 2"'$  (1.88 m)   Wt 129.4 kg   SpO2 97%   BMI 36.63 kg/m   Sitting EOB in good spirits, wants to go home, has no abdominal or chest pain. Does have his chronic  neck/back pain which is at baseline.  Irreg irreg, soft systolic murmur at base, no pitting edema Clear, nonlabored on room air Protuberant abdomen without focal tenderness, guarding. +BS.   Condition at discharge: stable  The results of significant diagnostics from this hospitalization (including imaging, microbiology, ancillary and laboratory) are listed below for reference.   Imaging Studies: MR BRAIN WO CONTRAST  Result Date: 01/24/2022 CLINICAL DATA:  Epidural abscess suspected.  Brain abscess. EXAM: MRI HEAD WITHOUT CONTRAST MRI CERVICAL SPINE WITHOUT CONTRAST TECHNIQUE: Multiplanar, multiecho pulse sequences of the brain and surrounding structures, and cervical spine, to include the craniocervical junction and cervicothoracic junction, were obtained without intravenous contrast. COMPARISON:  Brain MRI 11/23/2020 FINDINGS: MRI HEAD FINDINGS Brain: Motion degraded but still overall diagnostic. Negative for infarct, hemorrhage, hydrocephalus, mass, or collection. No evidence of intracranial purulence. Generalized brain atrophy. Vascular: Abnormal left vertebral and skull base ICA flow voids, stable from 2022. Skull and upper cervical spine: No evidence of marrow lesion Sinuses/Orbits: No acute finding MRI CERVICAL SPINE FINDINGS Only motion degraded sagittal images could be obtained before the patient could no longer continue the study. STIR imaging is negative for edema or fracture. The spinal canal is patent without evidence of collection. ACDF at C5-6 and C6-7. IMPRESSION: Brain MRI: Motion degraded but diagnostic study that is negative for acute finding. No cerebral abscess. Cervical MRI: Motion degraded and truncated study due to patient condition. No acute finding in the cervical spine. Electronically Signed   By: Jorje Guild M.D.   On: 01/24/2022 11:57   MR CERVICAL SPINE WO CONTRAST  Result Date: 01/24/2022 CLINICAL DATA:  Epidural abscess suspected.  Brain abscess. EXAM: MRI HEAD  WITHOUT CONTRAST MRI CERVICAL SPINE WITHOUT CONTRAST TECHNIQUE: Multiplanar, multiecho pulse sequences of the brain and surrounding structures, and cervical spine, to include the craniocervical junction and cervicothoracic junction, were obtained without intravenous contrast. COMPARISON:  Brain MRI 11/23/2020 FINDINGS: MRI HEAD FINDINGS Brain: Motion degraded but still overall diagnostic. Negative for infarct, hemorrhage, hydrocephalus, mass, or collection. No evidence of intracranial purulence. Generalized brain atrophy. Vascular: Abnormal left vertebral and skull base ICA  flow voids, stable from 2022. Skull and upper cervical spine: No evidence of marrow lesion Sinuses/Orbits: No acute finding MRI CERVICAL SPINE FINDINGS Only motion degraded sagittal images could be obtained before the patient could no longer continue the study. STIR imaging is negative for edema or fracture. The spinal canal is patent without evidence of collection. ACDF at C5-6 and C6-7. IMPRESSION: Brain MRI: Motion degraded but diagnostic study that is negative for acute finding. No cerebral abscess. Cervical MRI: Motion degraded and truncated study due to patient condition. No acute finding in the cervical spine. Electronically Signed   By: Jorje Guild M.D.   On: 01/24/2022 11:57   NM Hepatobiliary Liver Func  Result Date: 01/22/2022 CLINICAL DATA:  Abdominal pain.  Biliary obstruction suspected EXAM: NUCLEAR MEDICINE HEPATOBILIARY IMAGING TECHNIQUE: Sequential images of the abdomen were obtained out to 60 minutes following intravenous administration of radiopharmaceutical. RADIOPHARMACEUTICALS:  7.6 mCi Tc-73m Choletec IV COMPARISON:  01/21/2022 FINDINGS: Prompt uptake and biliary excretion of activity by the liver is seen. Gallbladder activity is visualized, consistent with patency of cystic duct. Biliary activity passes into small bowel, consistent with patent common bile duct. IMPRESSION: Normal scintigraphic hepatobiliary scan  without evidence of biliary obstruction. Electronically Signed   By: [name]   On: 01/22/2022 14:21   [place]Abdomen Limited RUQ (LIVER/GB)  Result Date: 01/21/2022 CLINICAL DATA:  Abdominal pain. EXAM: ULTRASOUND ABDOMEN LIMITED RIGHT UPPER QUADRANT COMPARISON:  None Available. FINDINGS: Gallbladder: Numerous ill-defined shadowing echogenic gallstones are seen within the gallbladder lumen. The gallbladder wall measures 4.01 mm in thickness. No sonographic Murphy sign noted by sonographer. Common bile duct: Diameter: 6.5 mm Liver: No focal lesion identified. Within normal limits in parenchymal echogenicity. Portal vein is patent on color Doppler imaging with normal direction of blood flow towards the liver. Other: None. IMPRESSION: Cholelithiasis and mild gallbladder wall thickening, without evidence of acute cholecystitis. Electronically Signed   By: TVirgina NorfolkM.D.   On: 01/21/2022 22:51   CT ABDOMEN PELVIS W CONTRAST  Result Date: 01/21/2022 CLINICAL DATA:  Acute abdominal pain. EXAM: CT ABDOMEN AND PELVIS WITH CONTRAST TECHNIQUE: Multidetector CT imaging of the abdomen and pelvis was performed using the standard protocol following bolus administration of intravenous contrast. RADIATION DOSE REDUCTION: This exam was performed according to the departmental dose-optimization program which includes automated exposure control, adjustment of the mA and/or kV according to patient size and/or use of iterative reconstruction technique. CONTRAST:  848mOMNIPAQUE IOHEXOL 300 MG/ML  SOLN COMPARISON:  CT 12/17/2021 FINDINGS: Lower chest: Trace right pleural effusion is new from prior exam. Breathing motion artifact limits detailed assessment. Heart is upper normal in size. There are coronary artery calcifications. Hepatobiliary: The liver is enlarged spanning 22.9 cm cranial caudal with diffuse steatosis. No evidence of focal liver lesion. There is layering hyperdensity in the gallbladder which may  represent stones/sludge. Question of mild pericholecystic fat stranding, not well assessed due to motion. No biliary dilatation. Small volume of perihepatic ascites. Pancreas: Fatty atrophy.  No ductal dilatation or inflammation. Spleen: Normal in size. No obvious focal abnormality, motion limited evaluation. Adrenals/Urinary Tract: Normal adrenal glands. No hydronephrosis. No renal calculi. Mild symmetric perinephric edema, also seen on prior no suspicious renal lesion. Unremarkable urinary bladder. Stomach/Bowel: Motion artifact through the upper abdomen limits bowel assessment. No inflammation of the stomach or duodenum. There is no small bowel obstruction or small bowel inflammation. Normal appendix visualized. Small volume of colonic stool. Sigmoid colon is redundant. Vascular/Lymphatic: Aortic atherosclerosis. Calcified  noncalcified atheromatous plaque. No aneurysm. Prominent periportal node measuring 12 mm, likely reactive. No suspicious adenopathy. Reproductive: Prostate is unremarkable. Other: Small amount of perihepatic ascites. Minimal free fluid in the right pericolic gutter which tracks into the pelvis. No free air. Fat within both inguinal canals, right greater than left. Musculoskeletal: Stable degenerative change in the spine. There are no acute or suspicious osseous abnormalities. IMPRESSION: 1. Layering hyperdensity in the gallbladder may represent stones/sludge. Question of mild pericholecystic fat stranding, not well assessed due to motion artifact. Right upper quadrant ultrasound is planned. 2. Hepatomegaly and hepatic steatosis. Small volume of perihepatic and right pericolic gutter ascites. 3. Trace right pleural effusion, new from prior exam. Aortic Atherosclerosis (ICD10-I70.0). Electronically Signed   By: Keith Rake M.D.   On: 01/21/2022 22:27   DG Chest 2 View  Result Date: 01/21/2022 CLINICAL DATA:  Crackles in chest EXAM: CHEST - 2 VIEW COMPARISON:  01/20/2022, 12/17/2021  FINDINGS: Electronic device over left chest. Cardiomegaly with mild central congestion. No focal airspace disease, pleural effusion, or pneumothorax. Aortic atherosclerosis. IMPRESSION: Cardiomegaly with mild central congestion Electronically Signed   By: Donavan Foil M.D.   On: 01/21/2022 20:38   DG Chest Port 1 View  Result Date: 01/20/2022 CLINICAL DATA:  Questionable sepsis - evaluate for abnormality EXAM: PORTABLE CHEST 1 VIEW COMPARISON:  CT chest 12/17/2021, chest x-Arch 12/17/2021 FINDINGS: The heart and mediastinal contours are unchanged. Aortic calcification. Improved aeration of the left base. No focal consolidation. No pulmonary edema. No pleural effusion. No pneumothorax. No acute osseous abnormality. IMPRESSION: No active disease. Electronically Signed   By: Iven Finn M.D.   On: 01/20/2022 01:11    Microbiology: Results for orders placed or performed during the hospital encounter of 01/21/22  Blood culture (routine x 2)     Status: None   Collection Time: 01/21/22  9:30 PM   Specimen: BLOOD  Result Value Ref Range Status   Specimen Description BLOOD BLOOD RIGHT ARM  Final   Special Requests   Final    BOTTLES DRAWN AEROBIC AND ANAEROBIC Blood Culture adequate volume   Culture   Final    NO GROWTH 5 DAYS Performed at [facility], [place] [address], [place], [place] [address]    Report Status 01/26/2022 FINAL  Final  Resp Panel by RT-PCR (Flu A&B, Covid) Anterior Nasal Swab     Status: None   Collection Time: 01/21/22 11:04 PM   Specimen: Anterior Nasal Swab  Result Value Ref Range Status   SARS Coronavirus 2 by RT PCR NEGATIVE NEGATIVE Final    Comment: (NOTE) SARS-CoV-2 target nucleic acids are NOT DETECTED.  The SARS-CoV-2 RNA is generally detectable in upper respiratory specimens during the acute phase of infection. The lowest concentration of SARS-CoV-2 viral copies this assay can detect is 138 copies/mL. A negative result does not preclude  SARS-Cov-2 infection and should not be used as the sole basis for treatment or other patient management decisions. A negative result may occur with  improper specimen collection/handling, submission of specimen other than nasopharyngeal swab, presence of viral mutation(s) within the areas targeted by this assay, and inadequate number of viral copies(<138 copies/mL). A negative result must be combined with clinical observations, patient history, and epidemiological information. The expected result is Negative.  Fact Sheet for Patients:  EntrepreneurPulse.com.au  Fact Sheet for Healthcare Providers:  IncredibleEmployment.be  This test is no t yet approved or cleared by the [place] FDA and  has been authorized for detection  and/or diagnosis of SARS-CoV-2 by FDA under an Emergency Use Authorization (EUA). This EUA will remain  in effect (meaning this test can be used) for the duration of the COVID-19 declaration under Section 564(b)(1) of the Act, 21 U.S.C.section 360bbb-3(b)(1), unless the authorization is terminated  or revoked sooner.       Influenza A by PCR NEGATIVE NEGATIVE Final   Influenza B by PCR NEGATIVE NEGATIVE Final    Comment: (NOTE) The Xpert Xpress SARS-CoV-2/FLU/RSV plus assay is intended as an aid in the diagnosis of influenza from Nasopharyngeal swab specimens and should not be used as a sole basis for treatment. Nasal washings and aspirates are unacceptable for Xpert Xpress SARS-CoV-2/FLU/RSV testing.  Fact Sheet for Patients: EntrepreneurPulse.com.au  Fact Sheet for Healthcare Providers: IncredibleEmployment.be  This test is not yet approved or cleared by the [place] FDA and has been authorized for detection and/or diagnosis of SARS-CoV-2 by FDA under an Emergency Use Authorization (EUA). This EUA will remain in effect (meaning this test can be used) for the duration of  the COVID-19 declaration under Section 564(b)(1) of the Act, 21 U.S.C. section 360bbb-3(b)(1), unless the authorization is terminated or revoked.  Performed at [facility], [place] [address], [place], [place] [address]   MRSA Next Gen by PCR, Nasal     Status: None   Collection Time: 01/22/22  8:30 PM   Specimen: Nasal Mucosa; Nasal Swab  Result Value Ref Range Status   MRSA by PCR Next Gen NOT DETECTED NOT DETECTED Final    Comment: (NOTE) The GeneXpert MRSA Assay (FDA approved for NASAL specimens only), is one component of a comprehensive MRSA colonization surveillance program. It is not intended to diagnose MRSA infection nor to guide or monitor treatment for MRSA infections. Test performance is not FDA approved in patients less than 73 years old. Performed at [facility], [place] [address], [place], [place] [address]    *Note: Due to a large number of results and/or encounters for the requested time period, some results have not been displayed. A complete set of results can be found in Results Review.    Labs: CBC: Recent Labs  Lab 01/20/22 0129 01/21/22 1911 01/22/22 0450 01/22/22 1439 01/23/22 0732 01/24/22 0402 01/25/22 0115 01/26/22 0551  WBC 10.4 20.9* 19.0*  --  12.4* 10.2 9.7 11.6*  NEUTROABS 8.5* 18.1*  --   --  10.1*  --   --   --   HGB 11.1* 11.5* 10.4* 11.6* 9.8* 9.5* 9.8* 9.7*  HCT 37.8* 38.2* 36.3* 34.0* 33.3* 32.2* 32.5* 33.6*  MCV 78.8* 77.6* 80.5  --  80.2 79.3* 78.3* 80.6  PLT 301 348 303  --  273 302 305 144   Basic Metabolic Panel: Recent Labs  Lab 01/22/22 1433 01/22/22 1439 01/23/22 0732 01/24/22 0402 01/25/22 0115 01/26/22 0551  NA 135 135 135 134* 136 133*  K 4.2 4.1 3.9 3.2* 3.5 3.8  CL 101  --  100 97* 102 98  CO2 22  --  '22 23 25 23  '$ GLUCOSE 155*  --  132* 204* 182* 240*  BUN 29*  --  29* 26* 18 15  CREATININE 1.68*  --  1.45* 1.30* 1.04 1.01  CALCIUM 8.6*  --  9.0 8.6* 8.8* 9.1  MG  --   --  2.2 2.2 2.3 2.2  PHOS   --   --  2.3* 2.1* 1.8* 2.0*   Liver Function Tests: Recent Labs  Lab 01/22/22 1433 01/23/22  0732 01/24/22 0402 01/25/22 0115 01/26/22 0551  AST 1,516* 647* 454* 474* 152*  ALT 720* 670* 607* 635* 445*  ALKPHOS 44 47 51 52 54  BILITOT 1.8* 2.2* 1.3* 1.1 0.9  PROT 7.0 7.2 7.1 7.0 7.1  ALBUMIN 3.2* 3.2* 3.1* 3.1* 3.1*   CBG: Recent Labs  Lab 01/25/22 1843 01/25/22 2014 01/25/22 2215 01/26/22 0619 01/26/22 1114  GLUCAP 212* 235* 226* 226* 199*    Discharge time spent: greater than 30 minutes.  Signed: Patrecia Pour, MD [facility] 01/26/2022

## 2022-01-26 NOTE — Progress Notes (Signed)
Occupational Therapy Treatment Patient Details Name: [name] [name] MRN: [id number] DOB: 07-02-1950 Today's Date: 01/26/2022   History of present illness Pt is a 71 y.o. male admitted 01/21/22 with fever, chills; workup for sepsis, unclear source of infection. PMH includes CAD, PAF, ischemic cardiomyopathy, DM, GERD, HTN, HLD, depression, obesity.   OT comments  Pt progressing towards goals this session, spouse present and supportive. Pt able to complete UB and LB dressing seated EOB with increased time. Declining further mobility at this time. Pt presenting with impairments listed below, will follow acutely. Recommend d/c home with family assistance.   Recommendations for follow up therapy are one component of a multi-disciplinary discharge planning process, led by the attending physician.  Recommendations may be updated based on patient status, additional functional criteria and insurance authorization.    Follow Up Recommendations  No OT follow up    Assistance Recommended at Discharge Intermittent Supervision/Assistance  Patient can return home with the following  Assist for transportation;Assistance with cooking/housework;A little help with bathing/dressing/bathroom   Equipment Recommendations  None recommended by OT    Recommendations for Other Services      Precautions / Restrictions Precautions Precautions: Fall Precaution Comments: watch O2 Restrictions Weight Bearing Restrictions: No       Mobility Bed Mobility               General bed mobility comments: pt EOB upon arrival    Transfers                   General transfer comment: pt declining     Balance Overall balance assessment: Needs assistance Sitting-balance support: Feet supported Sitting balance-Leahy Scale: Good Sitting balance - Comments: can reach outside BOS without LOB                                   ADL either performed or assessed with clinical judgement   ADL                    Upper Body Dressing : Supervision/safety;Sitting   Lower Body Dressing: Supervision/safety;Sitting/lateral leans                      Extremity/Trunk Assessment Upper Extremity Assessment Upper Extremity Assessment: Overall WFL for tasks assessed   Lower Extremity Assessment Lower Extremity Assessment: Generalized weakness        Vision   Vision Assessment?: No apparent visual deficits   Perception Perception Perception: Within Functional Limits   Praxis Praxis Praxis: Intact    Cognition Arousal/Alertness: Awake/alert Behavior During Therapy: WFL for tasks assessed/performed Overall Cognitive Status: Within Functional Limits for tasks assessed                                          Exercises      Shoulder Instructions       General Comments VSS on RA, spouse present    Pertinent Vitals/ Pain       Pain Assessment Pain Assessment: Faces Pain Location: back Pain Descriptors / Indicators: Discomfort Pain Intervention(s): Monitored during session  Home Living  Prior Functioning/Environment              Frequency  Min 2X/week        Progress Toward Goals  OT Goals(current goals can now be found in the care plan section)  Progress towards OT goals: Progressing toward goals  Acute Rehab OT Goals Patient Stated Goal: to go home OT Goal Formulation: With patient Time For Goal Achievement: 02/06/22 Potential to Achieve Goals: Good ADL Goals Pt Will Perform Grooming: Independently;sitting Pt Will Perform Upper Body Bathing: with set-up;sitting Pt Will Perform Upper Body Dressing: with set-up;sitting Pt Will Transfer to Toilet: with modified independence;ambulating;regular height toilet  Plan Discharge plan remains appropriate;Frequency remains appropriate    Co-evaluation                 AM-PAC OT "6 Clicks" Daily Activity      Outcome Measure   Help from another person eating meals?: None Help from another person taking care of personal grooming?: None Help from another person toileting, which includes using toliet, bedpan, or urinal?: A Little Help from another person bathing (including washing, rinsing, drying)?: A Little Help from another person to put on and taking off regular upper body clothing?: A Little Help from another person to put on and taking off regular lower body clothing?: A Little 6 Click Score: 20    End of Session    OT Visit Diagnosis: Unsteadiness on feet (R26.81)   Activity Tolerance Patient tolerated treatment well   Patient Left in bed;with call bell/phone within reach;with family/visitor present (seated EOB)   Nurse Communication Mobility status        Time: 2094-7096 OT Time Calculation (min): 17 min  Charges: OT General Charges $OT Visit: 1 Visit OT Treatments $Self Care/Home Management : 8-22 mins  Lynnda Child, OTD, OTR/L Acute Rehab [phone] - [place] 01/26/2022, 12:38 PM

## 2022-01-26 NOTE — Consult Note (Signed)
   [facility] CM Inpatient Consult   01/26/2022  ISAIR INABINET 02/15/1951 [id number]   [facility] [[facility]]  Accountable Care Organization [ACO] Patient: [employer] Medicare   Primary Care Provider: [name], FNP, [facility], is an [place] provider with a Care Management team and program and is listed for the [facility] follow up needs    Patient screened for less than 30 days readmission hospitalization with noted high risk score for unplanned readmission risk.  Reviewed to assess for potential [place] Management service needs for post hospital transition for readmission prevention needs.  Review of patient's medical record reveals patient has recently been outreached for TOC follow up. Met with patient and wife at the bedside and states he is feeling better.  He has had his annual wellness visit by phone.  He is planning to follow up with PCP.     Plan:  Referral request for care coordination follow up due to high risk for unplanned readmission risk and for readmission prevention measures.   For questions contact:    Natividad Brood, RN BSN [facility] Liaison  [phone] business mobile phone Toll free office [phone]  Fax number: [phone] [email or web address]

## 2022-01-26 NOTE — Care Management Important Message (Signed)
Important Message  Patient Details  Name: [name] [name] MRN: [id number] Date of Birth: 04-07-1951   Medicare Important Message Given:  Yes     Alzina Golda [address] 01/26/2022, 2:51 PM

## 2022-01-26 NOTE — Progress Notes (Signed)
Physical Therapy Treatment Patient Details Name: [name] [name] MRN: [id number] DOB: Jun 07, 1950 Today's Date: 01/26/2022   History of Present Illness Pt is a 71 y.o. male admitted 01/21/22 with fever, chills; workup for sepsis, unclear source of infection. PMH includes CAD, PAF, ischemic cardiomyopathy, DM, GERD, HTN, HLD, depression, obesity.    PT Comments    Pt received supine, stating he will d/c to home today and declining OOB mobility despite encouragement, however pt able to perform all bed mobility at supervision level this date. Pt receptive to education with extended time spent in education and discussion re; activity recommendations, HEP, importance of continued mobility and benefits of OOB mobility with pt and spouse verbalizing understating. Pt declining printed HEP. Pt continues to benefit from skilled PT services to progress toward functional mobility goals.    Recommendations for follow up therapy are one component of a multi-disciplinary discharge planning process, led by the attending physician.  Recommendations may be updated based on patient status, additional functional criteria and insurance authorization.  Follow Up Recommendations  No PT follow up     Assistance Recommended at Discharge Intermittent Supervision/Assistance  Patient can return home with the following A little help with bathing/dressing/bathroom;Assistance with cooking/housework;Assist for transportation;Help with stairs or ramp for entrance   Equipment Recommendations  None recommended by PT    Recommendations for Other Services       Precautions / Restrictions Precautions Precautions: Fall Precaution Comments: watch O2 Restrictions Weight Bearing Restrictions: No     Mobility  Bed Mobility Overal bed mobility: Modified Independent Bed Mobility: Rolling Rolling: Independent              Transfers Overall transfer level: Needs assistance                 General transfer  comment: pt declining    Ambulation/Gait                   Stairs             Wheelchair Mobility    Modified Rankin (Stroke Patients Only)       Balance Overall balance assessment: Needs assistance Sitting-balance support: Feet supported Sitting balance-Leahy Scale: Good     Standing balance support: Single extremity supported, No upper extremity supported, During functional activity Standing balance-Leahy Scale: Fair Standing balance comment: ambulatory without UE support                            Cognition Arousal/Alertness: Awake/alert Behavior During Therapy: WFL for tasks assessed/performed Overall Cognitive Status: Within Functional Limits for tasks assessed                                 General Comments: WFL for simple tasks, not formally assesed. wife reports "he's stubborn... likes to do things on his own timeline"        Exercises      General Comments General comments (skin integrity, edema, etc.): VSS on RA, spouse present      Pertinent Vitals/Pain Pain Assessment Pain Assessment: Faces Faces Pain Scale: No hurt Pain Intervention(s): Monitored during session    Home Living                          Prior Function            PT Goals (current goals  can now be found in the care plan section) Acute Rehab PT Goals Patient Stated Goal: return home PT Goal Formulation: With patient Time For Goal Achievement: 02/06/22    Frequency    Min 3X/week      PT Plan      Co-evaluation              AM-PAC PT "6 Clicks" Mobility   Outcome Measure  Help needed turning from your back to your side while in a flat bed without using bedrails?: None Help needed moving from lying on your back to sitting on the side of a flat bed without using bedrails?: None Help needed moving to and from a bed to a chair (including a wheelchair)?: A Little Help needed standing up from a chair using your  arms (e.g., wheelchair or bedside chair)?: A Little Help needed to walk in hospital room?: A Little Help needed climbing 3-5 steps with a railing? : A Little 6 Click Score: 20    End of Session   Activity Tolerance: Patient tolerated treatment well;Other (comment) (session limited per pt) Patient left: with family/visitor present;with call bell/phone within reach;in bed Nurse Communication: Mobility status PT Visit Diagnosis: Other abnormalities of gait and mobility (R26.89)     Time: 7619-5093 PT Time Calculation (min) (ACUTE ONLY): 16 min  Charges:  $Therapeutic Activity: 8-22 mins                     Triva Hueber R. PTA Acute Rehabilitation Services Office: [place] 01/26/2022, 12:44 PM

## 2022-01-26 NOTE — Progress Notes (Signed)
[facility] for Infectious Disease   Reason for visit: Follow up on dry gangrene  Interval History: remains afebrile; no new complaints.  Wife at bedside.  Blood cultures remain negative.   Day 6 total antibiotics  Physical Exam: Constitutional:  Vitals:   01/26/22 0738 01/26/22 0833  BP:  (!) 128/90  Pulse: (!) 105   Resp:  (!) 23  Temp: 98.3 F (36.8 C) 98.5 F (36.9 C)  SpO2: 97% 97%   patient appears in NAD Respiratory: Normal respiratory effort MS: left foot with dry gangrene similar to recent pictures Right ankle ulcer, dry  Review of Systems: Constitutional: negative for fevers and chills  Lab Results  Component Value Date   WBC 11.6 (H) 01/26/2022   HGB 9.7 (L) 01/26/2022   HCT 33.6 (L) 01/26/2022   MCV 80.6 01/26/2022   PLT 322 01/26/2022    Lab Results  Component Value Date   CREATININE 1.01 01/26/2022   BUN 15 01/26/2022   NA 133 (L) 01/26/2022   K 3.8 01/26/2022   CL 98 01/26/2022   CO2 23 01/26/2022    Lab Results  Component Value Date   ALT 445 (H) 01/26/2022   AST 152 (H) 01/26/2022   ALKPHOS 54 01/26/2022     Microbiology: Recent Results (from the past 240 hour(s))  Blood Culture (routine x 2)     Status: None   Collection Time: 01/20/22  1:29 AM   Specimen: BLOOD  Result Value Ref Range Status   Specimen Description BLOOD RAC  Final   Special Requests   Final    BOTTLES DRAWN AEROBIC AND ANAEROBIC Blood Culture adequate volume   Culture   Final    NO GROWTH 5 DAYS Performed at [facility], [address] [place], [place] [address]    Report Status 01/25/2022 FINAL  Final  Blood Culture (routine x 2)     Status: None   Collection Time: 01/20/22  1:29 AM   Specimen: BLOOD  Result Value Ref Range Status   Specimen Description BLOOD RFOA  Final   Special Requests   Final    BOTTLES DRAWN AEROBIC AND ANAEROBIC Blood Culture adequate volume   Culture   Final    NO GROWTH 5 DAYS Performed at [facility], [address], [place], [place] [address]    Report Status 01/25/2022 FINAL  Final  SARS Coronavirus 2 by RT PCR (hospital order, performed in [facility] hospital lab) *cepheid single result test* Urine, Clean Catch     Status: None   Collection Time: 01/20/22  3:53 AM   Specimen: Urine, Clean Catch; Nasal Swab  Result Value Ref Range Status   SARS Coronavirus 2 by RT PCR NEGATIVE NEGATIVE Final    Comment: (NOTE) SARS-CoV-2 target nucleic acids are NOT DETECTED.  The SARS-CoV-2 RNA is generally detectable in upper and lower respiratory specimens during the acute phase of infection. The lowest concentration of SARS-CoV-2 viral copies this assay can detect is 250 copies / mL. A negative result does not preclude SARS-CoV-2 infection and should not be used as the sole basis for treatment or other patient management decisions.  A negative result may occur with improper specimen collection / handling, submission of specimen other than nasopharyngeal swab, presence of viral mutation(s) within the areas targeted by this assay, and inadequate number of viral copies (<250 copies / mL). A negative result must be combined with clinical observations, patient history, and epidemiological information.  Fact Sheet for Patients:  https://www.patel.info/  Fact Sheet for Healthcare Providers: https://hall.com/  This test is not yet approved or  cleared by the [place] FDA and has been authorized for detection and/or diagnosis of SARS-CoV-2 by FDA under an Emergency Use Authorization (EUA).  This EUA will remain in effect (meaning this test can be used) for the duration of the COVID-19 declaration under Section 564(b)(1) of the Act, 21 U.S.C. section 360bbb-3(b)(1), unless the authorization is terminated or revoked sooner.  Performed at [facility], [address] [place], [place] [address]   Urine Culture     Status: Abnormal   Collection Time: 01/20/22  3:57  AM   Specimen: In/Out Cath Urine  Result Value Ref Range Status   Specimen Description   Final    IN/OUT CATH URINE Performed at [facility], [address] [place], [place] [address]    Special Requests   Final    NONE Performed at [facility], [address], [place], [place] [address]    Culture 3,000 COLONIES/mL ENTEROCOCCUS FAECALIS (A)  Final   Report Status 01/22/2022 FINAL  Final   Organism ID, Bacteria ENTEROCOCCUS FAECALIS (A)  Final      Susceptibility   Enterococcus faecalis - MIC*    AMPICILLIN <=2 SENSITIVE Sensitive     NITROFURANTOIN <=16 SENSITIVE Sensitive     VANCOMYCIN 2 SENSITIVE Sensitive     * 3,000 COLONIES/mL ENTEROCOCCUS FAECALIS  Blood culture (routine x 2)     Status: None   Collection Time: 01/21/22  9:30 PM   Specimen: BLOOD  Result Value Ref Range Status   Specimen Description BLOOD BLOOD RIGHT ARM  Final   Special Requests   Final    BOTTLES DRAWN AEROBIC AND ANAEROBIC Blood Culture adequate volume   Culture   Final    NO GROWTH 5 DAYS Performed at [facility], [place] [address] [place], [place] [address]    Report Status 01/26/2022 FINAL  Final  Resp Panel by RT-PCR (Flu A&B, Covid) Anterior Nasal Swab     Status: None   Collection Time: 01/21/22 11:04 PM   Specimen: Anterior Nasal Swab  Result Value Ref Range Status   SARS Coronavirus 2 by RT PCR NEGATIVE NEGATIVE Final    Comment: (NOTE) SARS-CoV-2 target nucleic acids are NOT DETECTED.  The SARS-CoV-2 RNA is generally detectable in upper respiratory specimens during the acute phase of infection. The lowest concentration of SARS-CoV-2 viral copies this assay can detect is 138 copies/mL. A negative result does not preclude SARS-Cov-2 infection and should not be used as the sole basis for treatment or other patient management decisions. A negative result may occur with  improper specimen collection/handling, submission of specimen other than nasopharyngeal swab, presence of viral  mutation(s) within the areas targeted by this assay, and inadequate number of viral copies(<138 copies/mL). A negative result must be combined with clinical observations, patient history, and epidemiological information. The expected result is Negative.  Fact Sheet for Patients:  EntrepreneurPulse.com.au  Fact Sheet for Healthcare Providers:  IncredibleEmployment.be  This test is no t yet approved or cleared by the [place] FDA and  has been authorized for detection and/or diagnosis of SARS-CoV-2 by FDA under an Emergency Use Authorization (EUA). This EUA will remain  in effect (meaning this test can be used) for the duration of the COVID-19 declaration under Section 564(b)(1) of the Act, 21 U.S.C.section 360bbb-3(b)(1), unless the authorization is terminated  or revoked sooner.       Influenza A  by PCR NEGATIVE NEGATIVE Final   Influenza B by PCR NEGATIVE NEGATIVE Final    Comment: (NOTE) The Xpert Xpress SARS-CoV-2/FLU/RSV plus assay is intended as an aid in the diagnosis of influenza from Nasopharyngeal swab specimens and should not be used as a sole basis for treatment. Nasal washings and aspirates are unacceptable for Xpert Xpress SARS-CoV-2/FLU/RSV testing.  Fact Sheet for Patients: EntrepreneurPulse.com.au  Fact Sheet for Healthcare Providers: IncredibleEmployment.be  This test is not yet approved or cleared by the [place] FDA and has been authorized for detection and/or diagnosis of SARS-CoV-2 by FDA under an Emergency Use Authorization (EUA). This EUA will remain in effect (meaning this test can be used) for the duration of the COVID-19 declaration under Section 564(b)(1) of the Act, 21 U.S.C. section 360bbb-3(b)(1), unless the authorization is terminated or revoked.  Performed at [facility], [place] [address], [place], [place] [address]   MRSA Next Gen by PCR, Nasal      Status: None   Collection Time: 01/22/22  8:30 PM   Specimen: Nasal Mucosa; Nasal Swab  Result Value Ref Range Status   MRSA by PCR Next Gen NOT DETECTED NOT DETECTED Final    Comment: (NOTE) The GeneXpert MRSA Assay (FDA approved for NASAL specimens only), is one component of a comprehensive MRSA colonization surveillance program. It is not intended to diagnose MRSA infection nor to guide or monitor treatment for MRSA infections. Test performance is not FDA approved in patients less than 58 years old. Performed at [facility], [place] [address] [place],  [address]     Impression/Plan:  1. Fever - report of fever at home reported as 97-99 degrees at his follow up visit 9/6 and he has remained afebrile during this admission.  All blood cultures have remained negative so will stop antibiotics and ok for discharge from ID standpoint.   2.  Leukocytosis - elevated on admission but now resolved.  Unclear etiology but no signs of infection as above.    3.  Dry gangrene - toes noted and much improved over the last several months and good care provided at home.  Recent xray noted and no signs of infection in the bone and overall it has been healing. I do not suspect a deep infection such as osteomyelitis at this time.  Certainly with ongoing wounds he will be at continued risk for reinfection until they are closed.  I discussed the MRI with the patient and wife and we are in agreement to cancel it and can consider it as a later time as indicated.    4.  Recurrent infection - I agree that a course of Augmentin on hand is a reasonable option in case he develops a high fever over 101 with no obvious source and then to seek medical attention.    He has follow up arranged.  I will sign off, thanks
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
## 2022-01-26 NOTE — TOC Initial Note (Addendum)
Transition of Care [facility]) - Initial/Assessment Note    Patient Details  Name: [name] [name] MRN: [id number] Date of Birth: 1950-10-31  Transition of Care [facility]) CM/SW Contact:    Zenon Mayo, RN Phone Number: 01/26/2022, 11:47 AM  Clinical Narrative:                 Patient is for dc today, wife will be transporting patient home today, he has cane, walker , rollator, and motorized w/chair. , walk in shower, and portable pottey.  He has no needs.   Expected Discharge Plan: Home/Self Care Barriers to Discharge: No Barriers Identified   Patient Goals and CMS Choice Patient states their goals for this hospitalization and ongoing recovery are:: return home with wife   Choice offered to / list presented to : NA  Expected Discharge Plan and Services Expected Discharge Plan: Home/Self Care In-house Referral: NA Discharge Planning Services: CM Consult Post Acute Care Choice: NA Living arrangements for the past 2 months: Single Family Home Expected Discharge Date: 01/26/22                 DME Agency: NA       HH Arranged: NA          Prior Living Arrangements/Services Living arrangements for the past 2 months: Single Family Home Lives with:: Spouse Patient language and need for interpreter reviewed:: Yes Do you feel safe going back to the place where you live?: Yes      Need for Family Participation in Patient Care: Yes (Comment) Care giver support system in place?: Yes (comment) Current home services: DME (cane, walker, rollator, motorizing w/chair) Criminal Activity/Legal Involvement Pertinent to Current Situation/Hospitalization: No - Comment as needed  Activities of Daily Living Home Assistive Devices/Equipment: Cane (specify quad or straight) ADL Screening (condition at time of admission) Patient's cognitive ability adequate to safely complete daily activities?: Yes Is the patient deaf or have difficulty hearing?: No Does the patient have difficulty seeing,  even when wearing glasses/contacts?: No Does the patient have difficulty concentrating, remembering, or making decisions?: No Patient able to express need for assistance with ADLs?: Yes Does the patient have difficulty dressing or bathing?: No Independently performs ADLs?: Yes (appropriate for developmental age) Does the patient have difficulty walking or climbing stairs?: Yes Weakness of Legs: Both Weakness of Arms/Hands: Both  Permission Sought/Granted                  Emotional Assessment Appearance:: Appears stated age Attitude/Demeanor/Rapport: Engaged Affect (typically observed): Appropriate Orientation: : Oriented to Self, Oriented to Place, Oriented to  Time, Oriented to Situation Alcohol / Substance Use: Not Applicable Psych Involvement: No (comment)  Admission diagnosis:  Cholecystitis, acute [K81.0] Elevated liver function tests [R79.89] SIRS (systemic inflammatory response syndrome) (HCC) [R65.10] Acute febrile illness [R50.9] Dry gangrene ([place]) [I96] Generalized weakness [R53.1] AKI (acute kidney injury) ([place]) [N17.9] Elevated lactic acid level [R79.89] Sepsis ([place]) [A41.9] Other elevated white blood cell (WBC) count [D72.828] Patient Active Problem List   Diagnosis Date Noted   Cholecystitis, acute    Elevated lactic acid level    Generalized weakness    Elevated liver function tests 01/22/2022   Nausea and vomiting 01/22/2022   Choledocholithiasis 01/22/2022   Goals of care, counseling/discussion 01/22/2022   Lactic acidosis 01/22/2022   Recurrent bacteremia 01/21/2022   Dry gangrene ([place]) 01/21/2022   Colonization status 01/21/2022   Pressure injury of skin 12/18/2021   SIRS (systemic inflammatory response syndrome) ([place]) 12/17/2021  Iron deficiency anemia 10/16/2021   Critical lower limb ischemia (HCC) 08/16/2021   Atherosclerosis of arteries of extremities ([place]) 08/15/2021   Coronary artery disease of native artery of native heart with stable  angina pectoris ([place])    Leg wound, left 07/25/2021   S/P femoral-popliteal bypass surgery 11/22/2020   PAOD (peripheral arterial occlusive disease) ([place]) 11/22/2020   Non-ST elevation (NSTEMI) myocardial infarction ([place]) 05/23/2020   Orthostatic hypotension 01/01/2020   Statin myopathy 12/06/2019   Diarrhea 05/10/2018   PAF (paroxysmal atrial fibrillation) ([place]) 03/29/2018   Lacunar infarction ([place]) 03/29/2018   Unstable angina (HCC)    Chest pain 01/18/2018   CHF (congestive heart failure) ([place]) 10/21/2017   Anxiety 03/09/2017   Constipation 11/26/2016   History of non-ST elevation myocardial infarction (NSTEMI) 07/29/2016   Diabetes ([place]) 05/25/2016   Occlusion and stenosis of vertebral artery    AKI (acute kidney injury) ([place]) 05/23/2016   Diabetic neuropathy ([place]) 01/13/2016   Depression 11/01/2015   Erectile dysfunction 01/23/2015   Class II obesity 01/23/2015   Periodontal disease 01/23/2015   Stopped smoking with greater than 30 pack year history 01/23/2015   Diabetic peripheral neuropathy associated with type 2 diabetes mellitus ([place]) 11/08/2014   Mucosal abnormality of stomach    History of adenomatous polyp of colon    GERD (gastroesophageal reflux disease) 09/06/2014   Low serum testosterone level 08/01/2014   DDD (degenerative disc disease), cervical 12/20/2013   Headache 08/07/2013   Medically noncompliant 10/25/2011   CAD S/P percutaneous coronary angioplasty 10/25/2011   MURMUR 04/16/2009   Hypertension associated with diabetes ([place]) 08/02/2008   Hyperlipidemia associated with type 2 diabetes mellitus ([place]) 07/31/2008   DISORDERS OF IRON METABOLISM 07/31/2008   PCP:  [name], FNP Pharmacy:   [facility] [address] [place] [address] Phone: [phone] Fax: [phone]  [place], [place] - [address] [place] [address] [place]  [address] Phone: [phone] Fax: [phone]     Social Determinants of Health (SDOH) Interventions    Readmission Risk Interventions    01/26/2022   11:45 AM 08/20/2021   10:36 AM 11/25/2020    1:52 PM  Readmission Risk Prevention Plan  Transportation Screening Complete Complete Complete  PCP or Specialist Appt within 5-7 Days  Complete Complete  PCP or Specialist Appt within 3-5 Days Complete    Home Care Screening  Complete Complete  Medication Review (RN CM)  Complete Complete  HRI or [place] Complete    Social Work Consult for [place] Planning/Counseling Complete    Palliative Care Screening Not Applicable    Medication Review Press photographer) Complete

## 2022-01-27 ENCOUNTER — Ambulatory Visit: Payer: Medicare Other | Admitting: Infectious Diseases

## 2022-01-27 ENCOUNTER — Telehealth: Payer: Self-pay

## 2022-01-27 NOTE — Chronic Care Management (AMB) (Unsigned)
  Care Coordination  Outreach Note  01/27/2022 Name: MACIAH SCHWEIGERT MRN: [id number] DOB: 08-Aug-1950   Care Coordination Outreach Attempts: An unsuccessful telephone outreach was attempted today to offer the patient information about available care coordination services as a benefit of their health plan.   Follow Up Plan:  Additional outreach attempts will be made to offer the patient care coordination information and services.   Encounter Outcome:  No Answer  Sig Noreene Larsson, [place], [place] [address] Direct Dial: [phone] [email or web address]

## 2022-01-28 DIAGNOSIS — L97911 Non-pressure chronic ulcer of unspecified part of right lower leg limited to breakdown of skin: Secondary | ICD-10-CM | POA: Diagnosis not present

## 2022-01-28 DIAGNOSIS — I70244 Atherosclerosis of native arteries of left leg with ulceration of heel and midfoot: Secondary | ICD-10-CM | POA: Diagnosis not present

## 2022-01-28 DIAGNOSIS — E11622 Type 2 diabetes mellitus with other skin ulcer: Secondary | ICD-10-CM | POA: Diagnosis not present

## 2022-01-28 DIAGNOSIS — I50813 Acute on chronic right heart failure: Secondary | ICD-10-CM | POA: Diagnosis not present

## 2022-01-28 DIAGNOSIS — S81802D Unspecified open wound, left lower leg, subsequent encounter: Secondary | ICD-10-CM | POA: Diagnosis not present

## 2022-01-28 DIAGNOSIS — E1140 Type 2 diabetes mellitus with diabetic neuropathy, unspecified: Secondary | ICD-10-CM | POA: Diagnosis not present

## 2022-01-28 DIAGNOSIS — I739 Peripheral vascular disease, unspecified: Secondary | ICD-10-CM | POA: Diagnosis not present

## 2022-01-28 DIAGNOSIS — L97522 Non-pressure chronic ulcer of other part of left foot with fat layer exposed: Secondary | ICD-10-CM | POA: Diagnosis not present

## 2022-01-28 DIAGNOSIS — S81801D Unspecified open wound, right lower leg, subsequent encounter: Secondary | ICD-10-CM | POA: Diagnosis not present

## 2022-01-28 NOTE — Chronic Care Management (AMB) (Signed)
  Care Coordination   Note   01/28/2022 Name: [name] [name] MRN: [id number] DOB: [birth year]  [name] [name] is a 71 y.o. year old male who sees Hawks, Theador Hawthorne, FNP for primary care. I reached out to [employer] by phone today to offer care coordination services.  [name] [name] was given information about Care Coordination services today including:   The Care Coordination services include support from the care team which includes your Nurse Coordinator, Clinical Social Worker, or Pharmacist.  The Care Coordination team is here to help remove barriers to the health concerns and goals most important to you. Care Coordination services are voluntary, and the patient may decline or stop services at any time by request to their care team member.   Care Coordination Consent Status: Patient agreed to services and verbal consent obtained.   Follow up plan:  Telephone appointment with care coordination team member scheduled for:  02/03/2022  Encounter Outcome:  Pt. Scheduled  Noreene Larsson, [place], [place] [address] Direct Dial: [phone] [name]

## 2022-01-30 ENCOUNTER — Telehealth: Payer: Self-pay | Admitting: Family

## 2022-01-30 NOTE — Telephone Encounter (Signed)
Chritsy from [facility] called to say she went to see pt after discharge from hospital and states he is very sick still. His BS was 211 after lunch but pt pt administered his insulin. Also his calf circumference was 39.5 and still looks like he has some fluid. They don't have a scale at home to weigh daily but said they would be getting one.

## 2022-02-02 ENCOUNTER — Other Ambulatory Visit: Payer: Self-pay | Admitting: Family

## 2022-02-02 NOTE — Telephone Encounter (Signed)
Lmtcb.

## 2022-02-02 NOTE — Telephone Encounter (Signed)
Pt needs hospital follow up

## 2022-02-03 ENCOUNTER — Ambulatory Visit: Payer: Self-pay | Admitting: *Deleted

## 2022-02-03 NOTE — Telephone Encounter (Signed)
Spouse rc

## 2022-02-03 NOTE — Telephone Encounter (Signed)
Appt made

## 2022-02-03 NOTE — Patient Outreach (Signed)
  Care Coordination   02/03/2022 Name: [name] [name] MRN: [id number] DOB: 04-28-1951   Care Coordination Outreach Attempts:  An unsuccessful telephone outreach was attempted today to offer the patient information about available care coordination services as a benefit of their health plan.  Unable to leave message at [phone]  E-mail sent to the e-mail address in demographics to request he call [facility] care guide to reschedule  Follow Up Plan:  Additional outreach attempts will be made to offer the patient care coordination information and services.   Encounter Outcome:  No Answer  Care Coordination Interventions Activated:  No   Care Coordination Interventions:  No, not indicated    SIG Kamal Jurgens L. Lavina Hamman, RN, BSN, [place] Coordinator Office number [phone]

## 2022-02-04 ENCOUNTER — Encounter: Payer: Self-pay | Admitting: Family Medicine

## 2022-02-05 ENCOUNTER — Ambulatory Visit (INDEPENDENT_AMBULATORY_CARE_PROVIDER_SITE_OTHER): Payer: Medicare Other | Admitting: Family

## 2022-02-05 ENCOUNTER — Encounter: Payer: Self-pay | Admitting: Family

## 2022-02-05 VITALS — BP 109/63 | HR 64 | Temp 97.4°F | Ht 74.0 in

## 2022-02-05 DIAGNOSIS — I70244 Atherosclerosis of native arteries of left leg with ulceration of heel and midfoot: Secondary | ICD-10-CM | POA: Diagnosis not present

## 2022-02-05 DIAGNOSIS — N179 Acute kidney failure, unspecified: Secondary | ICD-10-CM | POA: Diagnosis not present

## 2022-02-05 DIAGNOSIS — S81802D Unspecified open wound, left lower leg, subsequent encounter: Secondary | ICD-10-CM | POA: Diagnosis not present

## 2022-02-05 DIAGNOSIS — I739 Peripheral vascular disease, unspecified: Secondary | ICD-10-CM | POA: Diagnosis not present

## 2022-02-05 DIAGNOSIS — E1140 Type 2 diabetes mellitus with diabetic neuropathy, unspecified: Secondary | ICD-10-CM | POA: Diagnosis not present

## 2022-02-05 DIAGNOSIS — L97911 Non-pressure chronic ulcer of unspecified part of right lower leg limited to breakdown of skin: Secondary | ICD-10-CM | POA: Diagnosis not present

## 2022-02-05 DIAGNOSIS — S81801D Unspecified open wound, right lower leg, subsequent encounter: Secondary | ICD-10-CM | POA: Diagnosis not present

## 2022-02-05 DIAGNOSIS — Z794 Long term (current) use of insulin: Secondary | ICD-10-CM | POA: Diagnosis not present

## 2022-02-05 DIAGNOSIS — E1169 Type 2 diabetes mellitus with other specified complication: Secondary | ICD-10-CM

## 2022-02-05 DIAGNOSIS — Z09 Encounter for follow-up examination after completed treatment for conditions other than malignant neoplasm: Secondary | ICD-10-CM | POA: Diagnosis not present

## 2022-02-05 DIAGNOSIS — E1142 Type 2 diabetes mellitus with diabetic polyneuropathy: Secondary | ICD-10-CM | POA: Diagnosis not present

## 2022-02-05 DIAGNOSIS — R6510 Systemic inflammatory response syndrome (SIRS) of non-infectious origin without acute organ dysfunction: Secondary | ICD-10-CM | POA: Diagnosis not present

## 2022-02-05 DIAGNOSIS — I50813 Acute on chronic right heart failure: Secondary | ICD-10-CM | POA: Diagnosis not present

## 2022-02-05 DIAGNOSIS — E11622 Type 2 diabetes mellitus with other skin ulcer: Secondary | ICD-10-CM | POA: Diagnosis not present

## 2022-02-05 DIAGNOSIS — L97522 Non-pressure chronic ulcer of other part of left foot with fat layer exposed: Secondary | ICD-10-CM | POA: Diagnosis not present

## 2022-02-05 NOTE — Progress Notes (Signed)
Subjective:    Patient ID: [name] [name], male    DOB: 11-07-1950, 71 y.o.   MRN: [id number]  Chief Complaint  Patient presents with   Hospitalization Follow-up    BS 389 this morning. Patient is very weak. First BP was low and it has came up a bit    PT presents to the office today for hospital follow up. He went to the ED on 01/21/22 for sepsis  and SIRS. He has follow up with ID on 09/26 for recurrent sepsis with unclear source at this time. Due to recurrent bacteremia, ongoing dry gangrene/wounds, ID has recommended augmentin to be taken prn fever at home which is prescribed at discharge.  He will complete his doxycycline today.   His wounds on his legs and foot are improving, but still open. With mild clear discharge.  Diabetes He presents for his follow-up diabetic visit. Associated symptoms include blurred vision, fatigue and foot paresthesias. Symptoms are stable. Diabetic complications include heart disease, nephropathy and peripheral neuropathy. Risk factors for coronary artery disease include dyslipidemia, diabetes mellitus, hypertension, male sex and sedentary lifestyle. He is following a generally unhealthy diet. His overall blood glucose range is >200 mg/dl.      Review of Systems  Constitutional:  Positive for fatigue.  Eyes:  Positive for blurred vision.  All other systems reviewed and are negative.      Objective:   Physical Exam Vitals reviewed.  Constitutional:      General: He is not in acute distress.    Appearance: He is well-developed. He is obese.  HENT:     Head: Normocephalic.     Right Ear: Tympanic membrane normal.     Left Ear: Tympanic membrane normal.  Eyes:     General:        Right eye: No discharge.        Left eye: No discharge.     Pupils: Pupils are equal, round, and reactive to light.  Neck:     Thyroid: No thyromegaly.  Cardiovascular:     Rate and Rhythm: Normal rate and regular rhythm.     Heart sounds: Normal heart sounds. No  murmur heard. Pulmonary:     Effort: Pulmonary effort is normal. No respiratory distress.     Breath sounds: Normal breath sounds. No wheezing.  Abdominal:     General: Bowel sounds are normal. There is no distension.     Palpations: Abdomen is soft.     Tenderness: There is no abdominal tenderness.  Musculoskeletal:        General: No tenderness.     Cervical back: Normal range of motion and neck supple.     Right lower leg: Edema (trace) present.     Left lower leg: Edema (trace) present.     Comments: Bilateral discoloration of BLE  Skin:    General: Skin is warm and dry.     Findings: No erythema or rash.  Neurological:     Mental Status: He is alert and oriented to person, place, and time.     Cranial Nerves: No cranial nerve deficit.     Motor: Weakness (wheelchair) present.     Gait: Gait abnormal.     Deep Tendon Reflexes: Reflexes are normal and symmetric.  Psychiatric:        Behavior: Behavior normal.        Thought Content: Thought content normal.        Judgment: Judgment normal.     BP  109/63   Pulse 64   Temp (!) 97.4 F (36.3 C) (Temporal)   Ht $R'6\' 2"'Zs$  (1.88 m)   BMI 36.63 kg/m      Assessment & Plan:  [name] [name] comes in today with chief complaint of Hospitalization Follow-up (BS 389 this morning. Patient is very weak. First BP was low and it has came up a bit )   Diagnosis and orders addressed:  1. Type 2 diabetes mellitus with other specified complication, with long-term current use of insulin (HCC) - CMP14+EGFR - CBC with Differential/Platelet - Magnesium - Phosphorus  2. Diabetic peripheral neuropathy associated with type 2 diabetes mellitus (HCC) - CMP14+EGFR - CBC with Differential/Platelet - Magnesium - Phosphorus  3. AKI (acute kidney injury) ([place]) - CMP14+EGFR - CBC with Differential/Platelet - Magnesium - Phosphorus  4. SIRS (systemic inflammatory response syndrome) (HCC) - CMP14+EGFR - CBC with Differential/Platelet -  Magnesium - Phosphorus  5. Hospital discharge follow-up - CMP14+EGFR - CBC with Differential/Platelet - Magnesium - Phosphorus   Labs pending Health Maintenance reviewed Diet and exercise encouraged  Follow up plan: 3 months   [name] Dun, FNP

## 2022-02-05 NOTE — Patient Instructions (Signed)
Sepsis, Diagnosis, Adult Sepsis is a serious bodily reaction to an infection. The infection that triggers sepsis may be from a bacteria, virus, or fungus. Sepsis can result from an infection in any part of your body. Infections that commonly lead to sepsis include skin, lung, and urinary tract infections. Sepsis is a medical emergency that must be treated right away in a hospital. In severe cases, it can lead to septic shock. Septic shock can weaken your heart and cause your blood pressure to drop. This can cause your central nervous system and your body's organs to stop working. What are the causes? This condition is caused by a severe reaction to infections from bacteria, viruses, or fungus. The germs that most often lead to sepsis include: Escherichia coli (E. coli) bacteria. Staphylococcus aureus (staph) bacteria. Some types of Streptococcus bacteria. The most common infections affect these organs: The lung (pneumonia). The kidneys or bladder (urinary tract infection). The skin (cellulitis). The bowel, gallbladder, or pancreas. What increases the risk? You are more likely to develop this condition if: Your body's disease-fighting system (immune system) is weakened. You are age 65 or older. You are male. You had surgery or you have been hospitalized. You have these devices inserted into your body: A small, thin tube (catheter). IV line. Breathing tube. Drainage tube. You are not getting enough nutrients from food (malnourished). You have a chronic disease, such as cancer, lung disease, kidney disease, or diabetes. What are the signs or symptoms? Symptoms of this condition may include: Fever. Chills or feeling very cold. Confusion or anxiety. Fatigue. Muscle aches. Shortness of breath or rapid breathing (hyperventilation). Nausea and vomiting. Urinating much less than usual. Fast heart rate. Changes in skin color. Your skin may look blotchy, pale, or blue. Cool, clammy, or  sweaty skin. Skin rash. Other symptoms depend on the source of your infection. How is this diagnosed? This condition is diagnosed based on your symptoms, medical history, and physical exam. Other tests may also be done to find out the cause of the infection and how severe the sepsis is. Tests may include: Blood tests. Urine tests. Swabs from other areas of your body that may have an infection. These samples may be tested (cultured) to find out what type of bacteria is causing the infection. Chest X-[place] to check for pneumonia. Other imaging tests, such as a CT scan, may also be done. Lumbar puncture. This removes a small amount of the fluid that surrounds your brain and spinal cord. The fluid is then examined for infection. How is this treated? This condition must be treated in a hospital. Based on the cause of your infection, you may be given an antibiotic, antiviral, or antifungal medicine. You may also receive: Fluids through an IV. Oxygen and breathing assistance. Medicines to increase your blood pressure. Kidney dialysis. This process cleans your blood if your kidneys have failed. Surgery to remove infected tissue. Blood transfusion if needed. Medicine to prevent blood clots. Nutrients to correct imbalances in basic body function (metabolism). You may: Receive important salts and minerals (electrolytes) through an IV. Have your blood sugar level adjusted. Follow these instructions at home: Medicines  Take over-the-counter and prescription medicines only as told by your health care provider. If you were prescribed an antibiotic, antiviral, or antifungal medicine, take it as told by your health care provider. Do not stop taking the medicine even if you start to feel better. General instructions If you have a catheter or other indwelling device, ask to have it removed   as soon as possible. Keep all follow-up visits. This is important. Contact a health care provider if: You do not feel  like you are getting better or regaining strength. You are having trouble coping with your recovery. You frequently feel tired. You feel worse or do not seem to get better after surgery. You think you may have an infection after surgery. Get help right away if: You have any symptoms of sepsis. You have difficulty breathing. You have a rapid or skipping heartbeat. You become confused or disoriented. You have a high fever. Your skin becomes blotchy, pale, or blue. You have an infection that is getting worse or not getting better. These symptoms may represent a serious problem that is an emergency. Do not wait to see if the symptoms will go away. Get medical help right away. Call your local emergency services (911 in the [place] Do not drive yourself to the hospital. Summary Sepsis is a medical emergency that requires immediate treatment in a hospital. This condition is caused by a severe reaction to infections from bacteria, viruses, or fungus. Based on the cause of your infection, you may be given an antibiotic, antiviral, or antifungal medicine. Treatment may also include IV fluids, breathing assistance, and kidney dialysis. This information is not intended to replace advice given to you by your health care provider. Make sure you discuss any questions you have with your health care provider. Document Revised: 03/18/2020 Document Reviewed: 03/18/2020 [employer] Patient Education  2023 [employer]  

## 2022-02-06 LAB — PHOSPHORUS: Phosphorus: 4.7 mg/dL — ABNORMAL HIGH (ref 2.8–4.1)

## 2022-02-06 LAB — CMP14+EGFR
ALT: 26 IU/L (ref 0–44)
AST: 19 IU/L (ref 0–40)
Albumin/Globulin Ratio: 1.2 (ref 1.2–2.2)
Albumin: 4.4 g/dL (ref 3.8–4.8)
Alkaline Phosphatase: 76 IU/L (ref 44–121)
BUN/Creatinine Ratio: 18 (ref 10–24)
BUN: 34 mg/dL — ABNORMAL HIGH (ref 8–27)
Bilirubin Total: 0.8 mg/dL (ref 0.0–1.2)
CO2: 28 mmol/L (ref 20–29)
Calcium: 10.1 mg/dL (ref 8.6–10.2)
Chloride: 87 mmol/L — ABNORMAL LOW (ref 96–106)
Creatinine, Ser: 1.87 mg/dL — ABNORMAL HIGH (ref 0.76–1.27)
Globulin, Total: 3.8 g/dL (ref 1.5–4.5)
Glucose: 124 mg/dL — ABNORMAL HIGH (ref 70–99)
Potassium: 4.2 mmol/L (ref 3.5–5.2)
Sodium: 136 mmol/L (ref 134–144)
Total Protein: 8.2 g/dL (ref 6.0–8.5)
eGFR: 38 mL/min/{1.73_m2} — ABNORMAL LOW (ref >59)

## 2022-02-06 LAB — CBC WITH DIFFERENTIAL/PLATELET
Basophils Absolute: 0.1 10*3/uL (ref 0.0–0.2)
Basos: 1 %
EOS (ABSOLUTE): 0.1 10*3/uL (ref 0.0–0.4)
Eos: 1 %
Hematocrit: 40.3 % (ref 37.5–51.0)
Hemoglobin: 11.8 g/dL — ABNORMAL LOW (ref 13.0–17.7)
Immature Grans (Abs): 0 10*3/uL (ref 0.0–0.1)
Immature Granulocytes: 0 %
Lymphocytes Absolute: 2.4 10*3/uL (ref 0.7–3.1)
Lymphs: 22 %
MCH: 22.3 pg — ABNORMAL LOW (ref 26.6–33.0)
MCHC: 29.3 g/dL — ABNORMAL LOW (ref 31.5–35.7)
MCV: 76 fL — ABNORMAL LOW (ref 79–97)
Monocytes Absolute: 1 10*3/uL — ABNORMAL HIGH (ref 0.1–0.9)
Monocytes: 8 %
Neutrophils Absolute: 7.6 10*3/uL — ABNORMAL HIGH (ref 1.4–7.0)
Neutrophils: 68 %
Platelets: 597 10*3/uL — ABNORMAL HIGH (ref 150–450)
RBC: 5.29 x10E6/uL (ref 4.14–5.80)
RDW: 15.2 % (ref 11.6–15.4)
WBC: 11.3 10*3/uL — ABNORMAL HIGH (ref 3.4–10.8)

## 2022-02-06 LAB — MAGNESIUM: Magnesium: 2.2 mg/dL (ref 1.6–2.3)

## 2022-02-09 ENCOUNTER — Ambulatory Visit: Payer: Medicare Other | Admitting: Family

## 2022-02-09 ENCOUNTER — Other Ambulatory Visit: Payer: Self-pay | Admitting: Family

## 2022-02-09 MED ORDER — TRESIBA FLEXTOUCH 200 UNIT/ML ~~LOC~~ SOPN: 15.0000 [IU] | PEN_INJECTOR | Freq: Every evening | SUBCUTANEOUS | 2 refills | Status: DC

## 2022-02-09 NOTE — Addendum Note (Signed)
Addended by: Ladean Raya on: 02/09/2022 09:42 AM   Modules accepted: Orders

## 2022-02-10 ENCOUNTER — Encounter: Payer: Self-pay | Admitting: Infectious Diseases

## 2022-02-10 ENCOUNTER — Ambulatory Visit (INDEPENDENT_AMBULATORY_CARE_PROVIDER_SITE_OTHER): Payer: Medicare Other | Admitting: Infectious Diseases

## 2022-02-10 ENCOUNTER — Other Ambulatory Visit: Payer: Self-pay

## 2022-02-10 VITALS — BP 116/79 | HR 104 | Resp 16 | Ht 74.0 in | Wt 286.0 lb

## 2022-02-10 DIAGNOSIS — R509 Fever, unspecified: Secondary | ICD-10-CM | POA: Diagnosis not present

## 2022-02-10 DIAGNOSIS — I96 Gangrene, not elsewhere classified: Secondary | ICD-10-CM

## 2022-02-10 NOTE — Progress Notes (Unsigned)
Patient Active Problem List   Diagnosis Date Noted   Cholecystitis, acute    Elevated lactic acid level    Generalized weakness    Elevated liver function tests 01/22/2022   Nausea and vomiting 01/22/2022   Choledocholithiasis 01/22/2022   Goals of care, counseling/discussion 01/22/2022   Lactic acidosis 01/22/2022   Recurrent bacteremia 01/21/2022   Dry gangrene ([place]) 01/21/2022   Colonization status 01/21/2022   Pressure injury of skin 12/18/2021   SIRS (systemic inflammatory response syndrome) ([place]) 12/17/2021   Iron deficiency anemia 10/16/2021   Critical lower limb ischemia ([place]) 08/16/2021   Atherosclerosis of arteries of extremities ([place]) 08/15/2021   Coronary artery disease of native artery of native heart with stable angina pectoris ([place])    Leg wound, left 07/25/2021   S/P femoral-popliteal bypass surgery 11/22/2020   PAOD (peripheral arterial occlusive disease) ([place]) 11/22/2020   Non-ST elevation (NSTEMI) myocardial infarction ([place]) 05/23/2020   Orthostatic hypotension 01/01/2020   Statin myopathy 12/06/2019   Diarrhea 05/10/2018   PAF (paroxysmal atrial fibrillation) ([place]) 03/29/2018   Lacunar infarction ([place]) 03/29/2018   Unstable angina (HCC)    Chest pain 01/18/2018   CHF (congestive heart failure) ([place]) 10/21/2017   Anxiety 03/09/2017   Constipation 11/26/2016   History of non-ST elevation myocardial infarction (NSTEMI) 07/29/2016   Diabetes ([place]) 05/25/2016   Occlusion and stenosis of vertebral artery    AKI (acute kidney injury) ([place]) 05/23/2016   Acute kidney failure, unspecified ([place]) 05/23/2016   Diabetic neuropathy ([place]) 01/13/2016   Depression 11/01/2015   Erectile dysfunction 01/23/2015   Class II obesity 01/23/2015   Periodontal disease 01/23/2015   Stopped smoking with greater than 30 pack year history 01/23/2015   Diabetic peripheral neuropathy associated with type 2 diabetes mellitus ([place]) 11/08/2014   Mucosal abnormality of stomach     History of adenomatous polyp of colon    GERD (gastroesophageal reflux disease) 09/06/2014   Low serum testosterone level 08/01/2014   DDD (degenerative disc disease), cervical 12/20/2013   Headache 08/07/2013   Medically noncompliant 10/25/2011   CAD S/P percutaneous coronary angioplasty 10/25/2011   MURMUR 04/16/2009   Hypertension associated with diabetes ([place]) 08/02/2008   Hyperlipidemia associated with type 2 diabetes mellitus ([place]) 07/31/2008   DISORDERS OF IRON METABOLISM 07/31/2008    Patient's Medications  New Prescriptions   No medications on file  Previous Medications   ALBUTEROL (VENTOLIN HFA) 108 (90 BASE) MCG/ACT INHALER    Inhale 2 puffs into the lungs every 6 (six) hours as needed for wheezing or shortness of breath.   AMOXICILLIN-CLAVULANATE (AUGMENTIN) 875-125 MG TABLET    Take 1 tablet by mouth every 12 (twelve) hours. if fever >100.63F   BUSPIRONE (BUSPAR) 7.5 MG TABLET    Take 1 tablet (7.5 mg total) by mouth 3 (three) times daily as needed.   CALCIUM CARBONATE ANTACID (ALKA-SELTZER ANTACID PO)    Take 1 tablet by mouth 2 (two) times daily as needed (heartburn, indigestion).   CLOPIDOGREL (PLAVIX) 75 MG TABLET    TAKE 1 TABLET BY MOUTH DAILY   DAPAGLIFLOZIN PROPANEDIOL (FARXIGA) 5 MG TABS TABLET    Take 1 tablet (5 mg total) by mouth daily before breakfast.   DULOXETINE (CYMBALTA) 30 MG CAPSULE    Take 3 capsules (90 mg total) by mouth daily.   ELIQUIS 5 MG TABS TABLET    TAKE ONE (1) TABLET BY MOUTH TWICE DAILY   EZETIMIBE (ZETIA) 10 MG TABLET    Take 1 tablet (  10 mg total) by mouth daily.   FENOFIBRATE 160 MG TABLET    Take 1 tablet (160 mg total) by mouth daily.   FUROSEMIDE (LASIX) 20 MG TABLET    Take 20 mg by mouth daily.   GABAPENTIN (NEURONTIN) 300 MG CAPSULE    TAKE 1 CAPSULE BY MOUTH 3 TIMES DAILY   HUMULIN 70/30 (70-30) 100 UNIT/ML INJECTION    INJECT 60 UNITS SUBCUTANEOUSLY SUBCUTANEOUSLY TWICE DAILY WITH A MEAL   HYDROCODONE-ACETAMINOPHEN (NORCO) 10-325  MG TABLET    Take 1 tablet by mouth every 4 (four) hours as needed for moderate pain.   INSULIN DEGLUDEC (TRESIBA FLEXTOUCH) 200 UNIT/ML FLEXTOUCH PEN    Inject 16 Units into the skin at bedtime.   INSULIN LISPRO (HUMALOG) 100 UNIT/ML KWIKPEN    INJECT 15-20 UNITS SUBCUTANEOUSLY THREE TIMES A DAY WITH MEALS   ISOSORBIDE MONONITRATE (IMDUR) 30 MG 24 HR TABLET    TAKE 1 TABLET BY MOUTH DAILY   MENTHOL, TOPICAL ANALGESIC, (BLUE-EMU MAXIMUM STRENGTH EX)    Apply 1 application. topically daily as needed (pain).   METOPROLOL SUCCINATE (TOPROL-XL) 50 MG 24 HR TABLET    Take 1 tablet by mouth once daily   NITROGLYCERIN (NITROSTAT) 0.4 MG SL TABLET    DISSOLVE ONE TABLET UNDER THE TONGUE EVERY 5 MINUTES AS NEEDED FOR CHEST PAIN.  DO NOT EXCEED A TOTAL OF 3 DOSES IN 15 MINUTES Strength: 0.4 mg   PANTOPRAZOLE (PROTONIX) 40 MG TABLET    Take 1 tablet (40 mg total) by mouth 2 (two) times daily.   TIRZEPATIDE (MOUNJARO) 7.5 MG/0.5ML PEN    Inject 7.5 mg into the skin once a week.   VITAMIN B-12 (CYANOCOBALAMIN) 1000 MCG TABLET    Take 1,000 mcg by mouth daily.  Modified Medications   No medications on file  Discontinued Medications   No medications on file    Subjective: 71 year old male with PMH of CAD s/p stents, PAD with multiple vascular intervention, most recently 4/23 Stent of right tibioperoneal trunk and peroneal artery, atrial fibrillation on AC, anxiety/depression, DM, diastolic CHF, GERD, fibromyalgia, hyperlipidemia, hypertension, ACDF 2015, 07/2021 redo left femoral popliteal bypass graft who is here for HFU after recent hospital admission 9/6-9/11 for ? Sepsis of unclear cause. Patient was started on broad spectrum IV abtx with unremarkable infectious work up ( CT abdomen/pelvis 9/6 multiple findings as below, US abdomen 9/6 Cholelithiasis and mild gallbladder wall thickening, without evidence of acute cholecystitis. NM scan 9/7 Normal scintigraphic hepatobiliary scan without evidence of biliary  obstruction. MRI brain and c spine 01/24/22 unremarkable). He was seen by my ID partners Dr Tommy Medal as well as Dr Linus Salmons no clear source of infection. MRI of foot was planned however it was cancelled later per discussion of Dr Linus Salmons with patient/wife in the setting of claustrophobia with low suspicion for osteomyelitis of toes. Of note, patient was not febrile in the hospital.   Denies having any episodes of fevers since being discharged from the hospital. Wife says he had chills but patient denies and tells me it is " just feeling hot and cold as he is in his meopause". Denies any nausea, vomiting, had lower abdominal pain couple of days ago which eased off aftrer he had bowel movement. Tells me " I wish I had diarrhea". Denies any cough, chest pain and SOB. He has known increased frequency of urination due to enlarged prostate and no other GU symptoms. Generalized arthritic joint pain but no rashes.   He had  a neck surgery with steel plate placed approx 2-42 years ago for ruptured disc but with intermittent chronic neck pain which seems to be more frequent after he hit his head in the couch approx 8 months ago. Denies any headache, blurry vision or hearing changes. He also had a skull # 60 years ago which did not require surgical intervention but required to have patch in his left eye for 2 weeks and he was asked to not watch TV. No clear drainage form ear and nose.   He has an upcoming fu with Dr [name] and also has been following with Vascular for his chonic leg wound. He denies any new redness, swelling, tenderness or drainage from the chronic wounds.   Review of Systems: all systems reviewed with pertinent positives    Past Medical History:  Diagnosis Date   Anxiety    Arthritis    Atrial fibrillation ([place])    CAD (coronary artery disease)    a. 2010: DES to CTO of RCA. EF 55% b. 07/2016: cath showing total occlusion within previously placed RCA stent (collaterals present), severe stenosis along  LCx and OM1 (treated with 2 overlapping DES). c. repeat cath in 01/2018 showing patent stents along LCx and OM with CTO of D2, CTO of distal LCx, and CTO of RCA with collaterals present overall unchanged since 2018 with medical management recom   Cellulitis and abscess rt groin    Complication of anesthesia    " I woke up during a colonoscopy "      Depression    Diabetes mellitus    Diastolic CHF ([place])    Disorders of iron metabolism    Dysrhythmia    Fibromyalgia    GERD (gastroesophageal reflux disease)    History of hiatal hernia    Hyperlipidemia    Hypertension    Low serum testosterone level    Medically noncompliant    Myocardial infarction ([place])    05-23-20   Pneumonia    Past Surgical History:  Procedure Laterality Date   ABDOMINAL AORTOGRAM W/LOWER EXTREMITY N/A 03/21/2019   Procedure: ABDOMINAL AORTOGRAM W/LOWER EXTREMITY;  Surgeon: [name], MD;  Location: [facility];  Service: Cardiovascular;  Laterality: N/A;   ABDOMINAL AORTOGRAM W/LOWER EXTREMITY N/A 11/12/2020   Procedure: ABDOMINAL AORTOGRAM W/LOWER EXTREMITY;  Surgeon: [name], MD;  Location: [facility];  Service: Cardiovascular;  Laterality: N/A;   ABDOMINAL AORTOGRAM W/LOWER EXTREMITY N/A 01/28/2021   Procedure: ABDOMINAL AORTOGRAM W/LOWER EXTREMITY;  Surgeon: [name], MD;  Location: [facility] [place] [facility];  Service: Cardiovascular;  Laterality: N/A;   ABDOMINAL AORTOGRAM W/LOWER EXTREMITY Bilateral 07/29/2021   Procedure: ABDOMINAL AORTOGRAM W/LOWER EXTREMITY;  Surgeon: [name], MD;  Location: [facility];  Service: Cardiovascular;  Laterality: Bilateral;   ANGIOPLASTY N/A 08/15/2021   Procedure: ATTEMPTED RIGHT PERONEAL  ANGIOPLASTY, LEFT PERONEAL ANGIOPLASTY;  Surgeon: [name], MD;  Location: [facility];  Service: Vascular;  Laterality: N/A;   BACK SURGERY  2015   ACDF by Dr. Orland Jarred STUDY  12/23/2021   Procedure: BUBBLE STUDY;  Surgeon: Elouise Munroe, MD;  Location: [facility]OSCOPY;  Service: Cardiology;;   COLONOSCOPY N/A 10/01/2014   Dr. [name]: multiple tubular adenomas removed, colonic diverticulosis, redundant colon. next tcs advised for 09/2017. PATIENT NEEDS PROPOFOL FOR FAILED CONSCIOUS SEDATION   CORONARY STENT INTERVENTION N/A 07/30/2016   Procedure: Coronary Stent Intervention;  Surgeon: [name], MD;  Location: [facility];  Service: Cardiovascular;  Laterality: N/A;   CORONARY STENT PLACEMENT  2000   By Dr. Olevia Perches   EP IMPLANTABLE DEVICE N/A 05/25/2016   Procedure: Loop Recorder Insertion;  Surgeon: [name], MD;  Location: [place];  Service: Cardiovascular;  Laterality: N/A;   ESOPHAGOGASTRODUODENOSCOPY     esophagus [facility]   ESOPHAGOGASTRODUODENOSCOPY N/A 10/01/2014   Dr. [name]: patchy mottling/erythema and minimal polypoid appearance of gastric mucosa. bx with mild inlammation but no H.pylori   FEMORAL-POPLITEAL BYPASS GRAFT Left 11/22/2020   Procedure: LEFT FEMORAL-POPLITEAL BYPASS GRAFT;  Surgeon: [name], MD;  [facility];  Service: Vascular;  Laterality: Left;   FEMORAL-POPLITEAL BYPASS GRAFT Left 08/15/2021   Procedure: REDO LEFT FEMORAL-POPLITEAL BYPASS USING PROPATEN GRAFT;  Surgeon: [name], MD;  Location: [facility];  Service: Vascular;  Laterality: Left;   HERNIA REPAIR  [id number]   umbilical   INSERTION OF ILIAC STENT Right 11/22/2020   Procedure: INSERTION OF ELUVIA STENT INTO RIGHT DISTAL SUPERFICIAL FEMORAL ARTERY;  Surgeon: [name], MD;  Location: [facility];  Service: Vascular;  Laterality: Right;   LEFT HEART CATH AND CORONARY ANGIOGRAPHY N/A 07/30/2016   Procedure: Left Heart Cath and Coronary Angiography;  Surgeon: [name], MD;  Location: [facility];  Service: Cardiovascular;  Laterality: N/A;   LEFT HEART CATH AND CORONARY ANGIOGRAPHY N/A 01/19/2018   Procedure: LEFT HEART CATH AND CORONARY ANGIOGRAPHY;  Surgeon: [name], MD;   Location: [facility];  Service: Cardiovascular;  Laterality: N/A;   LEFT HEART CATH AND CORONARY ANGIOGRAPHY N/A 05/24/2020   Procedure: LEFT HEART CATH AND CORONARY ANGIOGRAPHY;  Surgeon: Burnell Blanks, MD;  Location: [facility];  Service: Cardiovascular;  Laterality: N/A;   LEFT HEART CATH AND CORONARY ANGIOGRAPHY N/A 08/06/2021   Procedure: LEFT HEART CATH AND CORONARY ANGIOGRAPHY;  Surgeon: Burnell Blanks, MD;  Location: [facility];  Service: Cardiovascular;  Laterality: N/A;   LESION REMOVAL     Lip and hand    LOWER EXTREMITY ANGIOGRAM Right 11/22/2020   Procedure: RIGHT LEG ANGIOGRAM;  Surgeon: [name], MD;  [facility];  Service: Vascular;  Laterality: Right;   LOWER EXTREMITY ANGIOGRAM Right 08/15/2021   Procedure: RIGHT LOWER EXTREMITY ANGIOGRAM;  Surgeon: [name], MD;  [facility];  Service: Vascular;  Laterality: Right;   LOWER EXTREMITY ANGIOGRAPHY N/A 04/18/2019   Procedure: LOWER EXTREMITY ANGIOGRAPHY;  Surgeon: [name], MD;  Location: [facility];  Service: Cardiovascular;  Laterality: N/A;   LOWER EXTREMITY ANGIOGRAPHY N/A 08/18/2021   Procedure: Lower Extremity Angiography;  Surgeon: Waynetta Sandy, MD;  Location: [place] [facility];  Service: Cardiovascular;  Laterality: N/A;   NECK SURGERY     PERIPHERAL VASCULAR BALLOON ANGIOPLASTY  04/18/2019   Procedure: PERIPHERAL VASCULAR BALLOON ANGIOPLASTY;  Surgeon: [name], MD;  Location: [facility];  Service: Cardiovascular;;   PERIPHERAL VASCULAR BALLOON ANGIOPLASTY Left 11/12/2020   Procedure: PERIPHERAL VASCULAR BALLOON ANGIOPLASTY;  Surgeon: [name], MD;  Location: [facility];  Service: Cardiovascular;  Laterality: Left;  Failed PTA of superficial femoral artery.   PERIPHERAL VASCULAR BALLOON ANGIOPLASTY Right 08/18/2021   Procedure: PERIPHERAL VASCULAR BALLOON ANGIOPLASTY;  Surgeon: Waynetta Sandy, MD;   Location: [place] [facility];  Service: Cardiovascular;  Laterality: Right;  peroneal   PERIPHERAL VASCULAR INTERVENTION Right 03/21/2019   Procedure:  PERIPHERAL VASCULAR INTERVENTION;  Surgeon: [name], MD;  Location: [facility];  Service: Cardiovascular;  Laterality: Right;  superficial femoral   PERIPHERAL VASCULAR INTERVENTION Right 08/18/2021   Procedure: PERIPHERAL VASCULAR INTERVENTION;  Surgeon: Waynetta Sandy, MD;  Location: [facility];  Service: Cardiovascular;  Laterality: Right;  tibial peroneal trunk   TEE WITHOUT CARDIOVERSION N/A 12/23/2021   Procedure: TRANSESOPHAGEAL ECHOCARDIOGRAM (TEE);  Surgeon: Elouise Munroe, MD;  Location: [facility];  Service: Cardiology;  Laterality: N/A;     Social History   Tobacco Use   Smoking status: Former    Packs/day: 0.25    Years: 51.00    Total pack years: 12.75    Types: Cigarettes    Quit date: 08/14/2016    Years since quitting: 5.4   Smokeless tobacco: Never   Tobacco comments:    smokes  a pack a week  Vaping Use   Vaping Use: Never used  Substance Use Topics   Alcohol use: No    Alcohol/week: 0.0 standard drinks of alcohol   Drug use: No    Family History  Problem Relation Age of Onset   Diabetes Father    Valvular heart disease Father    Arthritis Father    Heart disease Father    Stroke Father    Alzheimer's disease Mother    Hyperlipidemia Mother    Hypertension Mother    Arthritis Mother    Lung cancer Mother    Stroke Mother    Headache Mother    Arthritis/Rheumatoid Sister    Diabetes Sister    Hypertension Sister    Hyperlipidemia Sister    Depression Sister    Dementia Maternal Aunt    Dementia Maternal Uncle    Heart disease Maternal Uncle    Stomach cancer Paternal Uncle    Colon cancer Neg Hx    Liver disease Neg Hx     Allergies  Allergen Reactions   Shellfish Allergy Anaphylaxis, Hives and Swelling    Tongue swelling    Sulfa Antibiotics  Anaphylaxis, Hives and Swelling    Tongue swelling   Ace Inhibitors Other (See Comments) and Cough    CKD, renal failure    Invokana [Canagliflozin] Other (See Comments)    Syncope Dehydration    Lexapro [Escitalopram] Other (See Comments)    Buzzing in ears Headaches "Felt like a zombie"   Metformin And Related Itching   Pravachol [Pravastatin Sodium] Other (See Comments)    Myalgias    Repatha [Evolocumab] Other (See Comments)    Myalgias Flu like symptoms    Tricor [Fenofibrate] Other (See Comments)    Myalgias     Zestril [Lisinopril] Cough   Crestor [Rosuvastatin] Other (See Comments)    Myalgias    Horse-Derived Products Rash    horse serum   Lipitor [Atorvastatin] Other (See Comments)    Myalgias    Livalo [Pitavastatin] Other (See Comments)    Myalgias    Milk (Cow) Diarrhea and Nausea Only    Stomach upset    Tape Rash    Health Maintenance  Topic Date Due   Zoster Vaccines- Shingrix (1 of 2) 02/13/2022 (Originally 10/11/1969)   COVID-19 Vaccine (3 - Moderna risk series) 02/21/2022 (Originally 09/19/2019)   INFLUENZA VACCINE  08/16/2022 (Originally 12/16/2021)   Pneumonia Vaccine 20+ Years old (1 - PCV) 11/14/2022 (Originally 10/11/1956)   Diabetic kidney evaluation - Urine ACR  05/27/2022   FOOT EXAM  06/23/2022   HEMOGLOBIN A1C  06/28/2022   OPHTHALMOLOGY EXAM  07/01/2022   Diabetic kidney evaluation - GFR measurement  02/06/2023   COLONOSCOPY (Pts 45-44yr Insurance coverage will need to be confirmed)  09/30/2024   TETANUS/TDAP  09/16/2025   Hepatitis C Screening  Completed   HPV VACCINES  Aged Out    Objective: BP 116/79   Pulse (!) 104   Resp 16   Ht '6\' 2"'$  (1.88 m)   Wt 286 lb (129.7 kg)   SpO2 98%   BMI 36.72 kg/m  '  Physical Exam Constitutional:      Appearance: Normal appearance. Morbidly Obese  HENT:     Head: Normocephalic and atraumatic.      Mouth: Mucous membranes are moist. No midline c spine tenderness, has some reproducible  paracervical muscular tenderness Eyes:    Conjunctiva/sclera: Conjunctivae normal.     Pupils:    Cardiovascular:     Rate and Rhythm: Normal rate and regular rhythm.     Heart sounds: S1S2, no additional sounds.   Pulmonary:     Effort: Pulmonary effort is normal.     Breath sounds: Normal breath sounds.   Abdominal:     General: Non distended     Palpations: soft.   Musculoskeletal:        General: Normal range of motion.  Chronic dry gangrene of left toes  Chronic skin changes in the bilateral legs and surgical scar in the left thigh and leg  No signs of acute infection       Skin:    General: Skin is warm and dry.     Comments:  Neurological:     General: grossly non focal, sitting in a wheel chair    Mental Status: awake, alert and oriented to person, place, and time.   Psychiatric:        Mood and Affect: Mood normal.   Lab Results Lab Results  Component Value Date   WBC 11.3 (H) 02/05/2022   HGB 11.8 (L) 02/05/2022   HCT 40.3 02/05/2022   MCV 76 (L) 02/05/2022   PLT 597 (H) 02/05/2022    Lab Results  Component Value Date   CREATININE 1.87 (H) 02/05/2022   BUN 34 (H) 02/05/2022   NA 136 02/05/2022   K 4.2 02/05/2022   CL 87 (L) 02/05/2022   CO2 28 02/05/2022    Lab Results  Component Value Date   ALT 26 02/05/2022   AST 19 02/05/2022   ALKPHOS 76 02/05/2022   BILITOT 0.8 02/05/2022    Lab Results  Component Value Date   CHOL 127 01/23/2022   HDL 11 (L) 01/23/2022   LDLCALC 76 01/23/2022   LDLDIRECT 172 (H) 11/08/2014   TRIG 199 (H) 01/23/2022   CHOLHDL 11.5 01/23/2022   No results found for: "LABRPR", "RPRTITER" No results found for: "HIV1RNAQUANT", "HIV1RNAVL", "CD4TABS"   Microbiology Results for orders placed or performed during the hospital encounter of 01/21/22  Blood culture (routine x 2)     Status: None   Collection Time: 01/21/22  9:30 PM   Specimen: BLOOD  Result Value Ref Range Status   Specimen Description BLOOD BLOOD  RIGHT ARM  Final   Special Requests   Final    BOTTLES DRAWN AEROBIC AND ANAEROBIC Blood Culture adequate volume   Culture   Final    NO GROWTH 5 DAYS Performed at [facility] [id number], [place] [id number]   Report Status 01/26/2022 FINAL  Final  Resp  Panel by RT-PCR (Flu A&B, Covid) Anterior Nasal Swab     Status: None   Collection Time: 01/21/22 11:04 PM   Specimen: Anterior Nasal Swab  Result Value Ref Range Status   SARS Coronavirus 2 by RT PCR NEGATIVE NEGATIVE Final    Comment: (NOTE) SARS-CoV-2 target nucleic acids are NOT DETECTED.  The SARS-CoV-2 RNA is generally detectable in upper respiratory specimens during the acute phase of infection. The lowest concentration of SARS-CoV-2 viral copies this assay can detect is 138 copies/mL. A negative result does not preclude SARS-Cov-2 infection and should not be used as the sole basis for treatment or other patient management decisions. A negative result may occur with  improper specimen collection/handling, submission of specimen other than nasopharyngeal swab, presence of viral mutation(s) within the areas targeted by this assay, and inadequate number of viral copies(<138 copies/mL). A negative result must be combined with clinical observations, patient history, and epidemiological information. The expected result is Negative.  Fact Sheet for Patients:  EntrepreneurPulse.com.au  Fact Sheet for Healthcare Providers:  IncredibleEmployment.be  This test is no t yet approved or cleared by the [place] FDA and  has been authorized for detection and/or diagnosis of SARS-CoV-2 by FDA under an Emergency Use Authorization (EUA). This EUA will remain  in effect (meaning this test can be used) for the duration of the COVID-19 declaration under Section 564(b)(1) of the Act, 21 U.S.C.section 360bbb-3(b)(1), unless the authorization is terminated  or revoked sooner.        Influenza A by PCR NEGATIVE NEGATIVE Final   Influenza B by PCR NEGATIVE NEGATIVE Final    Comment: (NOTE) The Xpert Xpress SARS-CoV-2/FLU/RSV plus assay is intended as an aid in the diagnosis of influenza from Nasopharyngeal swab specimens and should not be used as a sole basis for treatment. Nasal washings and aspirates are unacceptable for Xpert Xpress SARS-CoV-2/FLU/RSV testing.  Fact Sheet for Patients: EntrepreneurPulse.com.au  Fact Sheet for Healthcare Providers: IncredibleEmployment.be  This test is not yet approved or cleared by the [place] FDA and has been authorized for detection and/or diagnosis of SARS-CoV-2 by FDA under an Emergency Use Authorization (EUA). This EUA will remain in effect (meaning this test can be used) for the duration of the COVID-19 declaration under Section 564(b)(1) of the Act, 21 U.S.C. section 360bbb-3(b)(1), unless the authorization is terminated or revoked.  Performed at [facility], [place] [address] [place], [place] [address]   MRSA Next Gen by PCR, Nasal     Status: None   Collection Time: 01/22/22  8:30 PM   Specimen: Nasal Mucosa; Nasal Swab  Result Value Ref Range Status   MRSA by PCR Next Gen NOT DETECTED NOT DETECTED Final    Comment: (NOTE) The GeneXpert MRSA Assay (FDA approved for NASAL specimens only), is one component of a comprehensive MRSA colonization surveillance program. It is not intended to diagnose MRSA infection nor to guide or monitor treatment for MRSA infections. Test performance is not FDA approved in patients less than 23 years old. Performed at [facility], [place] [address], [place], [place] [address]    *Note: Due to a large number of results and/or encounters for the requested time period, some results have not been displayed. A complete set of results can be found in Results Review.   Radiology  MR BRAIN WO CONTRAST  Result Date: 01/24/2022 CLINICAL  DATA:  Epidural abscess suspected.  Brain abscess. EXAM: MRI HEAD WITHOUT CONTRAST MRI CERVICAL SPINE WITHOUT CONTRAST  TECHNIQUE: Multiplanar, multiecho pulse sequences of the brain and surrounding structures, and cervical spine, to include the craniocervical junction and cervicothoracic junction, were obtained without intravenous contrast. COMPARISON:  Brain MRI 11/23/2020 FINDINGS: MRI HEAD FINDINGS Brain: Motion degraded but still overall diagnostic. Negative for infarct, hemorrhage, hydrocephalus, mass, or collection. No evidence of intracranial purulence. Generalized brain atrophy. Vascular: Abnormal left vertebral and skull base ICA flow voids, stable from 2022. Skull and upper cervical spine: No evidence of marrow lesion Sinuses/Orbits: No acute finding MRI CERVICAL SPINE FINDINGS Only motion degraded sagittal images could be obtained before the patient could no longer continue the study. STIR imaging is negative for edema or fracture. The spinal canal is patent without evidence of collection. ACDF at C5-6 and C6-7. IMPRESSION: Brain MRI: Motion degraded but diagnostic study that is negative for acute finding. No cerebral abscess. Cervical MRI: Motion degraded and truncated study due to patient condition. No acute finding in the cervical spine. Electronically Signed   By: Jorje Guild M.D.   On: 01/24/2022 11:57   MR CERVICAL SPINE WO CONTRAST  Result Date: 01/24/2022 CLINICAL DATA:  Epidural abscess suspected.  Brain abscess. EXAM: MRI HEAD WITHOUT CONTRAST MRI CERVICAL SPINE WITHOUT CONTRAST TECHNIQUE: Multiplanar, multiecho pulse sequences of the brain and surrounding structures, and cervical spine, to include the craniocervical junction and cervicothoracic junction, were obtained without intravenous contrast. COMPARISON:  Brain MRI 11/23/2020 FINDINGS: MRI HEAD FINDINGS Brain: Motion degraded but still overall diagnostic. Negative for infarct, hemorrhage, hydrocephalus, mass, or collection. No  evidence of intracranial purulence. Generalized brain atrophy. Vascular: Abnormal left vertebral and skull base ICA flow voids, stable from 2022. Skull and upper cervical spine: No evidence of marrow lesion Sinuses/Orbits: No acute finding MRI CERVICAL SPINE FINDINGS Only motion degraded sagittal images could be obtained before the patient could no longer continue the study. STIR imaging is negative for edema or fracture. The spinal canal is patent without evidence of collection. ACDF at C5-6 and C6-7. IMPRESSION: Brain MRI: Motion degraded but diagnostic study that is negative for acute finding. No cerebral abscess. Cervical MRI: Motion degraded and truncated study due to patient condition. No acute finding in the cervical spine. Electronically Signed   By: Jorje Guild M.D.   On: 01/24/2022 11:57   NM Hepatobiliary Liver Func  Result Date: 01/22/2022 CLINICAL DATA:  Abdominal pain.  Biliary obstruction suspected EXAM: NUCLEAR MEDICINE HEPATOBILIARY IMAGING TECHNIQUE: Sequential images of the abdomen were obtained out to 60 minutes following intravenous administration of radiopharmaceutical. RADIOPHARMACEUTICALS:  7.6 mCi Tc-26m Choletec IV COMPARISON:  01/21/2022 FINDINGS: Prompt uptake and biliary excretion of activity by the liver is seen. Gallbladder activity is visualized, consistent with patency of cystic duct. Biliary activity passes into small bowel, consistent with patent common bile duct. IMPRESSION: Normal scintigraphic hepatobiliary scan without evidence of biliary obstruction. Electronically Signed   By: [name]   On: 01/22/2022 14:21   [place]domen Limited RUQ (LIVER/GB)  Result Date: 01/21/2022 CLINICAL DATA:  Abdominal pain. EXAM: ULTRASOUND ABDOMEN LIMITED RIGHT UPPER QUADRANT COMPARISON:  None Available. FINDINGS: Gallbladder: Numerous ill-defined shadowing echogenic gallstones are seen within the gallbladder lumen. The gallbladder wall measures 4.01 mm in thickness. No  sonographic Murphy sign noted by sonographer. Common bile duct: Diameter: 6.5 mm Liver: No focal lesion identified. Within normal limits in parenchymal echogenicity. Portal vein is patent on color Doppler imaging with normal direction of blood flow towards the liver. Other: None. IMPRESSION: Cholelithiasis and mild gallbladder wall thickening, without evidence of  acute cholecystitis. Electronically Signed   By: Virgina Norfolk M.D.   On: 01/21/2022 22:51   CT ABDOMEN PELVIS W CONTRAST  Result Date: 01/21/2022 CLINICAL DATA:  Acute abdominal pain. EXAM: CT ABDOMEN AND PELVIS WITH CONTRAST TECHNIQUE: Multidetector CT imaging of the abdomen and pelvis was performed using the standard protocol following bolus administration of intravenous contrast. RADIATION DOSE REDUCTION: This exam was performed according to the departmental dose-optimization program which includes automated exposure control, adjustment of the mA and/or kV according to patient size and/or use of iterative reconstruction technique. CONTRAST:  50m OMNIPAQUE IOHEXOL 300 MG/ML  SOLN COMPARISON:  CT 12/17/2021 FINDINGS: Lower chest: Trace right pleural effusion is new from prior exam. Breathing motion artifact limits detailed assessment. Heart is upper normal in size. There are coronary artery calcifications. Hepatobiliary: The liver is enlarged spanning 22.9 cm cranial caudal with diffuse steatosis. No evidence of focal liver lesion. There is layering hyperdensity in the gallbladder which may represent stones/sludge. Question of mild pericholecystic fat stranding, not well assessed due to motion. No biliary dilatation. Small volume of perihepatic ascites. Pancreas: Fatty atrophy.  No ductal dilatation or inflammation. Spleen: Normal in size. No obvious focal abnormality, motion limited evaluation. Adrenals/Urinary Tract: Normal adrenal glands. No hydronephrosis. No renal calculi. Mild symmetric perinephric edema, also seen on prior no suspicious  renal lesion. Unremarkable urinary bladder. Stomach/Bowel: Motion artifact through the upper abdomen limits bowel assessment. No inflammation of the stomach or duodenum. There is no small bowel obstruction or small bowel inflammation. Normal appendix visualized. Small volume of colonic stool. Sigmoid colon is redundant. Vascular/Lymphatic: Aortic atherosclerosis. Calcified noncalcified atheromatous plaque. No aneurysm. Prominent periportal node measuring 12 mm, likely reactive. No suspicious adenopathy. Reproductive: Prostate is unremarkable. Other: Small amount of perihepatic ascites. Minimal free fluid in the right pericolic gutter which tracks into the pelvis. No free air. Fat within both inguinal canals, right greater than left. Musculoskeletal: Stable degenerative change in the spine. There are no acute or suspicious osseous abnormalities. IMPRESSION: 1. Layering hyperdensity in the gallbladder may represent stones/sludge. Question of mild pericholecystic fat stranding, not well assessed due to motion artifact. Right upper quadrant ultrasound is planned. 2. Hepatomegaly and hepatic steatosis. Small volume of perihepatic and right pericolic gutter ascites. 3. Trace right pleural effusion, new from prior exam. Aortic Atherosclerosis (ICD10-I70.0). Electronically Signed   By: MKeith RakeM.D.   On: 01/21/2022 22:27   DG Chest 2 View  Result Date: 01/21/2022 CLINICAL DATA:  Crackles in chest EXAM: CHEST - 2 VIEW COMPARISON:  01/20/2022, 12/17/2021 FINDINGS: Electronic device over left chest. Cardiomegaly with mild central congestion. No focal airspace disease, pleural effusion, or pneumothorax. Aortic atherosclerosis. IMPRESSION: Cardiomegaly with mild central congestion Electronically Signed   By: KDonavan FoilM.D.   On: 01/21/2022 20:38   DG Chest Port 1 View  Result Date: 01/20/2022 CLINICAL DATA:  Questionable sepsis - evaluate for abnormality EXAM: PORTABLE CHEST 1 VIEW COMPARISON:  CT chest  12/17/2021, chest x-[facility] 12/17/2021 FINDINGS: The heart and mediastinal contours are unchanged. Aortic calcification. Improved aeration of the left base. No focal consolidation. No pulmonary edema. No pleural effusion. No pneumothorax. No acute osseous abnormality. IMPRESSION: No active disease. Electronically Signed   By: MIven FinnM.D.   On: 01/20/2022 01:11    Assessment/Plan # ? Fevers  - no fevers in the hospital as well as since discharge with no new concerns  - He has been prescribed augmentin in case of any signs and symptoms of sepsis, fevers  more than 101F and seek medical attention - Fu in 3 months   # Recurrent Group B streptococcus bacteremia   # PAD s/p multiple vascular intervention  # Chronic dry gangrene of left toes and rt 5th toe with multiple pressure ulcers in the bilateral ankle and foot  - No signs and symptoms or acute infection and cellulitis  - Vascular following   I have personally spent 45 minutes involved in face-to-face and non-face-to-face activities for this patient on the day of the visit. Professional time spent includes the following activities: Preparing to see the patient (review of tests), Obtaining and/or reviewing separately obtained history (admission/discharge record), Performing a medically appropriate examination and/or evaluation , Ordering medications/tests/procedures, referring and communicating with other health care professionals, Documenting clinical information in the EMR, Independently interpreting results (not separately reported), Communicating results to the patient/family/caregiver, Counseling and educating the patient/family/caregiver and Care coordination (not separately reported).   Wilber Oliphant, [facility] 02/10/2022, 10:21 AM

## 2022-02-11 DIAGNOSIS — S81801D Unspecified open wound, right lower leg, subsequent encounter: Secondary | ICD-10-CM | POA: Diagnosis not present

## 2022-02-11 DIAGNOSIS — E1140 Type 2 diabetes mellitus with diabetic neuropathy, unspecified: Secondary | ICD-10-CM | POA: Diagnosis not present

## 2022-02-11 DIAGNOSIS — S81802D Unspecified open wound, left lower leg, subsequent encounter: Secondary | ICD-10-CM | POA: Diagnosis not present

## 2022-02-11 DIAGNOSIS — I50813 Acute on chronic right heart failure: Secondary | ICD-10-CM | POA: Diagnosis not present

## 2022-02-11 DIAGNOSIS — I70244 Atherosclerosis of native arteries of left leg with ulceration of heel and midfoot: Secondary | ICD-10-CM | POA: Diagnosis not present

## 2022-02-11 DIAGNOSIS — L97522 Non-pressure chronic ulcer of other part of left foot with fat layer exposed: Secondary | ICD-10-CM | POA: Diagnosis not present

## 2022-02-11 DIAGNOSIS — I739 Peripheral vascular disease, unspecified: Secondary | ICD-10-CM | POA: Diagnosis not present

## 2022-02-11 DIAGNOSIS — L97911 Non-pressure chronic ulcer of unspecified part of right lower leg limited to breakdown of skin: Secondary | ICD-10-CM | POA: Diagnosis not present

## 2022-02-11 DIAGNOSIS — E11622 Type 2 diabetes mellitus with other skin ulcer: Secondary | ICD-10-CM | POA: Diagnosis not present

## 2022-02-11 NOTE — Progress Notes (Unsigned)
Cardiology Office Note    Date:  02/12/2022   ID:  [name] [name], DOB 07/14/1950, MRN [id number]  PCP:  [name], FNP  Cardiologist: Jenkins Rouge, MD   EP: Dr. Lovena Le  Chief Complaint  Patient presents with   Hospitalization Follow-up    History of Present Illness:    [name] [name] is a 71 y.o. male with past medical history of CAD (s/p DES to RCA in 2010, cath in 07/2016 showing CTO of RCA with collaterals, did receive DESx2 to LCx and OM1, repeat cath in 01/2018 showing patent stents along LCx and OM with CTO of D2, CTO of distal LCx, and CTO of RCA with collaterals present overall unchanged since 2018, cath in 05/2020 with restenosis of proximal LCx and OM1 treated with 2 overlapping DES, cath in 07/2021 showing no options for PCI and medical management recommended), paroxysmal atrial fibrillation, carotid artery stenosis, PAD (s/p right SFA stenting in 03/2019 and left fem-pop in 11/2020), HTN, HLD (intolerant to statins and Repatha), IDDM, and prior CVA who presents to the office today for hospital follow-up.   He was examined by Dr. [name] in 08/2021 and was no longer on Amiodarone due to visual side effects and was scheduled to follow-up with Dr. Lovena Le to review Multaq or Tikosyn. He did see Dr. Lovena Le in 10/2021 and reported only occasional palpitations and was not interested in antiarrhythmic medications or catheter ablation at that time.   In the interim, he was admitted to [facility] in 12/2021 for Sepsis in the setting of Group B bacteremia and was followed by ID. Cardiology was consulted for a TEE and this showed no evidence of vegetations but was noted to have moderate to severe MR which was likely functional and moderate AI. He was again admitted from 9/6 - 01/26/2022 for SIRS in the setting of Group B bacteremia. Cardiology was consulted due to elevated troponin values which peaked at 454 and felt to be secondary to demand ischemia. Repeat ischemic evaluation was not  pursued given his recent cath in 07/2021 showing no options for intervention.   In talking with the patient and his wife today, he reports feeling weak since his hospital admission but is trying to increase his activity level. His respiratory status continues to improve. No recent chest pain or palpitations. No specific orthopnea, PND or pitting edema. He has not needed to utilize Lasix since hospital discharge.    Past Medical History:  Diagnosis Date   Anxiety    Arthritis    Atrial fibrillation (HCC)    CAD (coronary artery disease)    a. 2010: DES to CTO of RCA. EF 55% b. 07/2016: cath showing total occlusion within previously placed RCA stent (collaterals present), severe stenosis along LCx and OM1 (treated with 2 overlapping DES). c. repeat cath in 01/2018 showing patent stents along LCx and OM with CTO of D2, CTO of distal LCx, and CTO of RCA with collaterals present overall unchanged since 2018 with medical management recom   Cellulitis and abscess rt groin    Complication of anesthesia    " I woke up during a colonoscopy "      Depression    Diabetes mellitus    Diastolic CHF ([place])    Disorders of iron metabolism    Dysrhythmia    Fibromyalgia    GERD (gastroesophageal reflux disease)    History of hiatal hernia    Hyperlipidemia    Hypertension    Low serum testosterone  level    Medically noncompliant    Myocardial infarction [facility])    05-23-20   Pneumonia     Past Surgical History:  Procedure Laterality Date   ABDOMINAL AORTOGRAM W/LOWER EXTREMITY N/A 03/21/2019   Procedure: ABDOMINAL AORTOGRAM W/LOWER EXTREMITY;  Surgeon: [name], MD;  Location: [place] [facility];  Service: Cardiovascular;  Laterality: N/A;   ABDOMINAL AORTOGRAM W/LOWER EXTREMITY N/A 11/12/2020   Procedure: ABDOMINAL AORTOGRAM W/LOWER EXTREMITY;  Surgeon: [name], MD;  Location: [place] [facility];  Service: Cardiovascular;  Laterality: N/A;   ABDOMINAL AORTOGRAM W/LOWER EXTREMITY N/A  01/28/2021   Procedure: ABDOMINAL AORTOGRAM W/LOWER EXTREMITY;  Surgeon: [name], MD;  Location: [place] [facility];  Service: Cardiovascular;  Laterality: N/A;   ABDOMINAL AORTOGRAM W/LOWER EXTREMITY Bilateral 07/29/2021   Procedure: ABDOMINAL AORTOGRAM W/LOWER EXTREMITY;  Surgeon: [name], MD;  Location: [place] [facility];  Service: Cardiovascular;  Laterality: Bilateral;   ANGIOPLASTY N/A 08/15/2021   Procedure: ATTEMPTED RIGHT PERONEAL  ANGIOPLASTY, LEFT PERONEAL ANGIOPLASTY;  Surgeon: [name], MD;  Location: [place];  Service: Vascular;  Laterality: N/A;   BACK SURGERY  2015   ACDF by Dr. Orland Jarred STUDY  12/23/2021   Procedure: BUBBLE STUDY;  Surgeon: Elouise Munroe, MD;  Location: [facility];  Service: Cardiology;;   COLONOSCOPY N/A 10/01/2014   Dr. [name]: multiple tubular adenomas removed, colonic diverticulosis, redundant colon. next tcs advised for 09/2017. PATIENT NEEDS PROPOFOL FOR FAILED CONSCIOUS SEDATION   CORONARY STENT INTERVENTION N/A 07/30/2016   Procedure: Coronary Stent Intervention;  Surgeon: [name], MD;  Location: [place] [facility];  Service: Cardiovascular;  Laterality: N/A;   CORONARY STENT PLACEMENT  2000   By Dr. Olevia Perches   EP IMPLANTABLE DEVICE N/A 05/25/2016   Procedure: Loop Recorder Insertion;  Surgeon: [name], MD;  Location: [place] [facility];  Service: Cardiovascular;  Laterality: N/A;   ESOPHAGOGASTRODUODENOSCOPY     esophagus stretched remotely at [facility]   ESOPHAGOGASTRODUODENOSCOPY N/A 10/01/2014   Dr. [name]: patchy mottling/erythema and minimal polypoid appearance of gastric mucosa. bx with mild inlammation but no H.pylori   FEMORAL-POPLITEAL BYPASS GRAFT Left 11/22/2020   Procedure: LEFT FEMORAL-POPLITEAL BYPASS GRAFT;  Surgeon: [name], MD;  [facility]: [facility];  Service: Vascular;  Laterality: Left;   FEMORAL-POPLITEAL BYPASS GRAFT Left 08/15/2021   Procedure: REDO LEFT FEMORAL-POPLITEAL BYPASS USING PROPATEN  GRAFT;  Surgeon: [name], MD;  Location: [place];  Service: Vascular;  Laterality: Left;   HERNIA REPAIR  [id number]   umbilical   INSERTION OF ILIAC STENT Right 11/22/2020   Procedure: INSERTION OF ELUVIA STENT INTO RIGHT DISTAL SUPERFICIAL FEMORAL ARTERY;  Surgeon: [name], MD;  Location: [place];  Service: Vascular;  Laterality: Right;   LEFT HEART CATH AND CORONARY ANGIOGRAPHY N/A 07/30/2016   Procedure: Left Heart Cath and Coronary Angiography;  Surgeon: [name], MD;  Location: [place] [facility];  Service: Cardiovascular;  Laterality: N/A;   LEFT HEART CATH AND CORONARY ANGIOGRAPHY N/A 01/19/2018   Procedure: LEFT HEART CATH AND CORONARY ANGIOGRAPHY;  Surgeon: [name], MD;  Location: [place] [facility];  Service: Cardiovascular;  Laterality: N/A;   LEFT HEART CATH AND CORONARY ANGIOGRAPHY N/A 05/24/2020   Procedure: LEFT HEART CATH AND CORONARY ANGIOGRAPHY;  Surgeon: Burnell Blanks, MD;  Location: [place] [facility];  Service: Cardiovascular;  Laterality: N/A;   LEFT HEART CATH AND CORONARY ANGIOGRAPHY N/A 08/06/2021  Procedure: LEFT HEART CATH AND CORONARY ANGIOGRAPHY;  Surgeon: Burnell Blanks, MD;  Location: [place] [facility];  Service: Cardiovascular;  Laterality: N/A;   LESION REMOVAL     Lip and hand    LOWER EXTREMITY ANGIOGRAM Right 11/22/2020   Procedure: RIGHT LEG ANGIOGRAM;  Surgeon: [name], MD;  [facility];  Service: Vascular;  Laterality: Right;   LOWER EXTREMITY ANGIOGRAM Right 08/15/2021   Procedure: RIGHT LOWER EXTREMITY ANGIOGRAM;  Surgeon: [name], MD;  [facility];  Service: Vascular;  Laterality: Right;   LOWER EXTREMITY ANGIOGRAPHY N/A 04/18/2019   Procedure: LOWER EXTREMITY ANGIOGRAPHY;  Surgeon: [name], MD;  Location: [place] [facility];  Service: Cardiovascular;  Laterality: N/A;   LOWER EXTREMITY ANGIOGRAPHY N/A 08/18/2021   Procedure: Lower Extremity Angiography;  Surgeon: Waynetta Sandy, MD;  Location: [place] [facility];  Service: Cardiovascular;  Laterality: N/A;   NECK SURGERY     PERIPHERAL VASCULAR BALLOON ANGIOPLASTY  04/18/2019   Procedure: PERIPHERAL VASCULAR BALLOON ANGIOPLASTY;  Surgeon: [name], MD;  Location: [place] [facility];  Service: Cardiovascular;;   PERIPHERAL VASCULAR BALLOON ANGIOPLASTY Left 11/12/2020   Procedure: PERIPHERAL VASCULAR BALLOON ANGIOPLASTY;  Surgeon: [name], MD;  Location: [place] [facility];  Service: Cardiovascular;  Laterality: Left;  Failed PTA of superficial femoral artery.   PERIPHERAL VASCULAR BALLOON ANGIOPLASTY Right 08/18/2021   Procedure: PERIPHERAL VASCULAR BALLOON ANGIOPLASTY;  Surgeon: Waynetta Sandy, MD;  Location: [place] [facility];  Service: Cardiovascular;  Laterality: Right;  peroneal   PERIPHERAL VASCULAR INTERVENTION Right 03/21/2019   Procedure: PERIPHERAL VASCULAR INTERVENTION;  Surgeon: [name], MD;  Location: [place] [facility];  Service: Cardiovascular;  Laterality: Right;  superficial femoral   PERIPHERAL VASCULAR INTERVENTION Right 08/18/2021   Procedure: PERIPHERAL VASCULAR INTERVENTION;  Surgeon: Waynetta Sandy, MD;  Location: [place] [facility];  Service: Cardiovascular;  Laterality: Right;  tibial peroneal trunk   TEE WITHOUT CARDIOVERSION N/A 12/23/2021   Procedure: TRANSESOPHAGEAL ECHOCARDIOGRAM (TEE);  Surgeon: Elouise Munroe, MD;  Location: [place];  Service: Cardiology;  Laterality: N/A;    Current Medications: Outpatient Medications Prior to Visit  Medication Sig Dispense Refill   albuterol (VENTOLIN HFA) 108 (90 Base) MCG/ACT inhaler Inhale 2 puffs into the lungs every 6 (six) hours as needed for wheezing or shortness of breath. 8.5 g 0   Calcium Carbonate Antacid (ALKA-SELTZER ANTACID PO) Take 1 tablet by mouth 2 (two) times daily as needed (heartburn, indigestion).     clopidogrel (PLAVIX) 75 MG tablet TAKE 1 TABLET BY MOUTH DAILY (Patient  taking differently: Take 75 mg by mouth daily.) 30 tablet 10   dapagliflozin propanediol (FARXIGA) 5 MG TABS tablet Take 1 tablet (5 mg total) by mouth daily before breakfast. 90 tablet 2   DULoxetine (CYMBALTA) 30 MG capsule Take 3 capsules (90 mg total) by mouth daily. (Patient taking differently: Take 90 mg by mouth at bedtime.) 120 capsule 1   ELIQUIS 5 MG TABS tablet TAKE ONE (1) TABLET BY MOUTH TWICE DAILY (Patient taking differently: Take 5 mg by mouth 2 (two) times daily.) 60 tablet 2   ezetimibe (ZETIA) 10 MG tablet Take 1 tablet (10 mg total) by mouth daily. 90 tablet 3   fenofibrate 160 MG tablet Take 1 tablet (160 mg total) by mouth daily. 90 tablet 3   furosemide (LASIX) 20 MG tablet Take 20 mg by mouth as needed.  gabapentin (NEURONTIN) 300 MG capsule TAKE 1 CAPSULE BY MOUTH 3 TIMES DAILY 90 capsule 10   HUMULIN 70/30 (70-30) 100 UNIT/ML injection INJECT 60 UNITS SUBCUTANEOUSLY SUBCUTANEOUSLY TWICE DAILY WITH A MEAL 40 mL 10   HYDROcodone-acetaminophen (NORCO) 10-325 MG tablet Take 1 tablet by mouth every 4 (four) hours as needed for moderate pain. (Patient taking differently: Take 1 tablet by mouth 2 (two) times daily as needed (pain).) 30 tablet 0   insulin degludec (TRESIBA FLEXTOUCH) 200 UNIT/ML FlexTouch Pen Inject 16 Units into the skin at bedtime. 9 mL 2   insulin lispro (HUMALOG) 100 UNIT/ML KwikPen INJECT 15-20 UNITS SUBCUTANEOUSLY THREE TIMES A DAY WITH MEALS (Patient taking differently: 15-20 Units 3 (three) times daily. Sliding scale) 15 mL 10   isosorbide mononitrate (IMDUR) 30 MG 24 hr tablet TAKE 1 TABLET BY MOUTH DAILY (Patient taking differently: Take 30 mg by mouth daily.) 30 tablet 10   Menthol, Topical Analgesic, (BLUE-EMU MAXIMUM STRENGTH EX) Apply 1 application. topically daily as needed (pain).     metoprolol succinate (TOPROL-XL) 50 MG 24 hr tablet Take 1 tablet by mouth once daily 90 tablet 1   metoprolol tartrate (LOPRESSOR) 50 MG tablet Take 50 mg by mouth  as needed.     nitroGLYCERIN (NITROSTAT) 0.4 MG SL tablet DISSOLVE ONE TABLET UNDER THE TONGUE EVERY 5 MINUTES AS NEEDED FOR CHEST PAIN.  DO NOT EXCEED A TOTAL OF 3 DOSES IN 15 MINUTES Strength: 0.4 mg (Patient taking differently: Place 0.4 mg under the tongue every 5 (five) minutes x 3 doses as needed for chest pain.) 25 tablet 0   pantoprazole (PROTONIX) 40 MG tablet Take 1 tablet (40 mg total) by mouth 2 (two) times daily. 60 tablet 0   tirzepatide (MOUNJARO) 7.5 MG/0.5ML Pen Inject 7.5 mg into the skin once a week. (Patient taking differently: Inject 7.5 mg into the skin every Friday.) 6 mL 1   vitamin B-12 (CYANOCOBALAMIN) 1000 MCG tablet Take 1,000 mcg by mouth daily.     amoxicillin-clavulanate (AUGMENTIN) 875-125 MG tablet Take 1 tablet by mouth every 12 (twelve) hours. if fever >100.13F (Patient not taking: Reported on 02/10/2022) 10 tablet 0   busPIRone (BUSPAR) 7.5 MG tablet Take 1 tablet (7.5 mg total) by mouth 3 (three) times daily as needed. (Patient not taking: Reported on 02/10/2022) 90 tablet 2   No facility-administered medications prior to visit.     Allergies:   Shellfish allergy, Sulfa antibiotics, Ace inhibitors, Invokana [canagliflozin], Lexapro [escitalopram], Metformin and related, Pravachol [pravastatin sodium], Repatha [evolocumab], Tricor [fenofibrate], Zestril [lisinopril], Crestor [rosuvastatin], Horse-derived products, Lipitor [atorvastatin], Livalo [pitavastatin], Milk (cow), and Tape   Social History   Socioeconomic History   Marital status: Married    Spouse name: Belenda Cruise   Number of children: 2   Years of education: 12   Highest education level: 12th grade  Occupational History   Occupation: retired  Tobacco Use   Smoking status: Former    Packs/day: 0.25    Years: 51.00    Total pack years: 12.75    Types: Cigarettes    Quit date: 08/14/2016    Years since quitting: 5.5   Smokeless tobacco: Never   Tobacco comments:    smokes  a pack a week  Vaping  Use   Vaping Use: Never used  Substance and Sexual Activity   Alcohol use: No    Alcohol/week: 0.0 standard drinks of alcohol   Drug use: No   Sexual activity: Yes  Other Topics Concern  Not on file  Social History Narrative   Lives with his wife.  Retired.  Unable to afford expensive medicines.     He has 2 children from previous marriage - they live in [place]       Caffeine: 1 cup of 1/2 caff coffee in AM, drinks more if at a restaurant    Social Determinants of Health   Financial Resource Strain: Medium Risk (01/06/2021)   Overall Financial Resource Strain (CARDIA)    Difficulty of Paying Living Expenses: Somewhat hard  Food Insecurity: No Food Insecurity (01/24/2022)   Hunger Vital Sign    Worried About Running Out of Food in the Last Year: Never true    Ran Out of Food in the Last Year: Never true  Transportation Needs: No Transportation Needs (01/24/2022)   PRAPARE - Hydrologist (Medical): No    Lack of Transportation (Non-Medical): No  Physical Activity: Insufficiently Active (01/06/2021)   Exercise Vital Sign    Days of Exercise per Week: 7 days    Minutes of Exercise per Session: 10 min  Stress: No Stress Concern Present (01/06/2021)   [place]    Feeling of Stress : Only a little  Social Connections: Moderately Isolated (01/06/2021)   Social Connection and Isolation Panel [NHANES]    Frequency of Communication with Friends and Family: More than three times a week    Frequency of Social Gatherings with Friends and Family: Twice a week    Attends Religious Services: Never    Marine scientist or Organizations: No    Attends Music therapist: Never    Marital Status: Married     Family History:  The patient's family history includes Alzheimer's disease in his mother; Arthritis in his father and mother; Arthritis/Rheumatoid in his sister; Dementia in his  maternal aunt and maternal uncle; Depression in his sister; Diabetes in his father and sister; Headache in his mother; Heart disease in his father and maternal uncle; Hyperlipidemia in his mother and sister; Hypertension in his mother and sister; Lung cancer in his mother; Stomach cancer in his paternal uncle; Stroke in his father and mother; Valvular heart disease in his father.   Review of Systems:    Please see the history of present illness.     All other systems reviewed and are otherwise negative except as noted above.   Physical Exam:    VS:  BP 130/70   Pulse 97   Ht '6\' 2"'$  (1.88 m)   Wt 273 lb (123.8 kg)   SpO2 95%   BMI 35.05 kg/m    General: Well developed, well nourished,male appearing in no acute distress. Head: Normocephalic, atraumatic. Neck: No carotid bruits. JVD not elevated.  Lungs: Respirations regular and unlabored, without wheezes or rales.  Heart: Irregularly irregular. 2/6 systolic murmur along Apex.  Abdomen: Appears non-distended. No obvious abdominal masses. Msk:  Strength and tone appear normal for age. No obvious joint deformities or effusions. Extremities: No clubbing or cyanosis. No pitting edema.  Distal pedal pulses are 2+ bilaterally. Neuro: Alert and oriented X 3. Moves all extremities spontaneously. No focal deficits noted. Psych:  Responds to questions appropriately with a normal affect. Skin: No rashes or lesions noted  Wt Readings from Last 3 Encounters:  02/12/22 273 lb (123.8 kg)  02/10/22 286 lb (129.7 kg)  01/22/22 285 lb 4.4 oz (129.4 kg)     Studies/Labs Reviewed:  EKG:  EKG is not ordered today.   Recent Labs: 01/22/2022: B Natriuretic Peptide 604.7 02/05/2022: ALT 26; BUN 34; Creatinine, Ser 1.87; Hemoglobin 11.8; Magnesium 2.2; Platelets 597; Potassium 4.2; Sodium 136   Lipid Panel    Component Value Date/Time   CHOL 127 01/23/2022 0732   CHOL 232 (H) 02/14/2020 1135   TRIG 199 (H) 01/23/2022 0732   HDL 11 (L) 01/23/2022  0732   HDL 15 (L) 02/14/2020 1135   CHOLHDL 11.5 01/23/2022 0732   VLDL 40 01/23/2022 0732   LDLCALC 76 01/23/2022 0732   LDLCALC 108 (H) 02/14/2020 1135   LDLDIRECT 172 (H) 11/08/2014 0908    Additional studies/ records that were reviewed today include:   LHC: 07/2021 Prox LAD lesion is 40% stenosed.   Mid Cx to Dist Cx lesion is 100% stenosed.   Prox RCA to Mid RCA lesion is 90% stenosed.   Prox RCA lesion is 99% stenosed.   Ost 1st Diag lesion is 40% stenosed.   Ost 2nd Diag to 2nd Diag lesion is 100% stenosed.   Previously placed Ost 1st Mrg stent (unknown type) is  widely patent.   Previously placed Prox Cx stent (unknown type) is  widely patent.   1. Moderate mid LAD stenosis unchanged. Chronic occlusion Diagonal 1. Severe disease small diagonal 2.  2. Patent Circumflex/OM stents. Chronic occlusion of the mid Circumflex beyond the takeoff of the obtuse marginal branch. This branch fills from left to left collaterals.  3. Chronic occlusion mid RCA in old stented segment. The distal vessel fills briskly from left to right collaterals.  4. LVEDP=28 mmHg   Recommendations: Continue medical management of CAD. Ok to proceed with planned vascular surgery. Resume Eliquis tomorrow. I will ask him to begin lasix 40 mg daily.   Echocardiogram: 12/19/2021 IMPRESSIONS     1. Left ventricular ejection fraction, by estimation, is 45 to 50%. The  left ventricle has mildly decreased function. The left ventricle  demonstrates global hypokinesis. The left ventricular internal cavity size  was mildly dilated. Left ventricular  diastolic parameters are indeterminate.   2. Right ventricular systolic function is normal. The right ventricular  size is normal. There is normal pulmonary artery systolic pressure. The  estimated right ventricular systolic pressure is 58.5 mmHg.   3. The mitral valve is normal in structure. Mild mitral valve  regurgitation. No evidence of mitral stenosis.   4. The  aortic valve is tricuspid. There is mild calcification of the  aortic valve. Aortic valve regurgitation is trivial.   5. The inferior vena cava is normal in size with greater than 50%  respiratory variability, suggesting right atrial pressure of 3 mmHg.   TEE: 12/23/2021 IMPRESSIONS     1. Left ventricular ejection fraction, by estimation, is 45%. The left  ventricle has mildly decreased function.   2. Right ventricular systolic function is mildly reduced. The right  ventricular size is normal.   3. Left atrial size was mildly dilated. No left atrial/left atrial  appendage thrombus was detected. The LAA emptying velocity was 26 cm/s.   4. Right atrial size was mildly dilated.   5. The mitral valve is grossly normal. Moderate-severe mitral valve  regurgitation appears functional and commissural, from lack of complete  coaption of the leaflets. Systolic reversal seen in the RUPV, otherwise  systolic blunting noted. No evidence of  mitral stenosis.   6. Tricuspid valve regurgitation is moderate.   7. The aortic valve is tricuspid. There is mild calcification  of the  aortic valve. Aortic valve regurgitation is moderate. Vena contracta 0.5  cm.   8. There is Severe (Grade IV) atheroma plaque involving the aortic arch  and descending aorta.   9. Agitated saline contrast bubble study was negative, with no evidence  of any interatrial shunt.   Conclusion(s)/Recommendation(s): No evidence of vegetation/infective  endocarditis on this transesophageael echocardiogram.   Assessment:    1. Coronary artery disease involving native coronary artery of native heart without angina pectoris   2. PAF (paroxysmal atrial fibrillation) ([place])   3. PAD (peripheral artery disease) (HCC)   4. HFrEF (heart failure with reduced ejection fraction) ([place])   5. Hyperlipidemia LDL goal <70   6. Valvular heart disease      Plan:   In order of problems listed above:  1. CAD - He has an extensive CAD  history as outlined above and most recent cath in 07/2021 showed no options for PCI and medical management was recommended.  - He has baseline dyspnea on exertion but denies any exertional chest pain.  - Continue current medical therapy with Plavix '75mg'$  daily, Imdur '30mg'$  daily, Toprol-XL '50mg'$  daily, Zetia and Fenofibrate.   2. Paroxysmal Atrial Fibrillation - Appears to be more persistent by recent EKG's. He reports palpitations have improved. Was previously intolerant to Amiodarone and not interested in additional options or ablation at the time of his last visit with Dr. Lovena Le. Will continue Toprol-XL '50mg'$  daily and he does have an Rx for PRN Lopressor he can use for break-through episodes.  - He remains on Eliquis '5mg'$  BID for anticoagulation which is the appropriate dose at this time given his age, weight and renal function.   3. PAD - He is s/p right SFA stenting in 03/2019 and left fem-pop in 11/2020. Followed by Vascular Surgery as an outpatient. Remains on Plavix and statin therapy.   4. HFmrEF - His EF was at 45-50% by most recent imaging. He appears euvolemic today and has not utilized Lasix recently. Recommended to avoid using for now given his recently elevated creatinine (at 1.87 by recent labs last week). Continue Imdur, Toprol-XL and [place]. Would recommend titration of Farxiga to '10mg'$  daily for CHF dosing once renal function improves.   5. HLD - His LDL was at 76 when checked earlier this month. He has been intolerant to statins and Repatha. Remains on Zetia and Fenofibrate.   6. Valvular Heart Disease - Recent TEE showed moderate to severe MR which was likely functional and moderate AI. Would anticipate repeat imaging in several months with a TTE.    He has previously scheduled follow-up with Dr. [name] next month and will keep that for now. I informed him he could call and cancel if doing well and could reschedule for 3-4 months from now.    Medication Adjustments/Labs and  Tests Ordered: Current medicines are reviewed at length with the patient today.  Concerns regarding medicines are outlined above.  Medication changes, Labs and Tests ordered today are listed in the Patient Instructions below. Patient Instructions  Medication Instructions:  Your physician recommends that you continue on your current medications as directed. Please refer to the Current Medication list given to you today.  *If you need a refill on your cardiac medications before your next appointment, please call your pharmacy*   Lab Work: NONE   If you have labs (blood work) drawn today and your tests are completely normal, you will receive your results only by: [facility] (if you have  MyChart) OR A paper copy in the mail If you have any lab test that is abnormal or we need to change your treatment, we will call you to review the results.   Testing/Procedures: NONE    Follow-Up: At [facility], you and your health needs are our priority.  As part of our continuing mission to provide you with exceptional heart care, we have created designated Provider Care Teams.  These Care Teams include your primary Cardiologist (physician) and Advanced Practice Providers (APPs -  Physician Assistants and Nurse Practitioners) who all work together to provide you with the care you need, when you need it.  We recommend signing up for the patient portal called "MyChart".  Sign up information is provided on this After Visit Summary.  MyChart is used to connect with patients for Virtual Visits (Telemedicine).  Patients are able to view lab/test results, encounter notes, upcoming appointments, etc.  Non-urgent messages can be sent to your provider as well.   To learn more about what you can do with MyChart, go to [email or web address].    Your next appointment:    As Scheduled   The format for your next appointment:   In Person  Provider:   You may see Jenkins Rouge, MD or one of the  following Advanced Practice Providers on your designated Care Team:   Bernerd Pho, PA-C  Ermalinda Barrios, PA-C     Other Instructions Thank you for choosing [facility]!    Important Information About Sugar         Signed, Erma Heritage, PA-C  02/12/2022 8:13 PM    [place] [address] [place], [place] [address] Phone: [phone] Fax: [phone]

## 2022-02-12 ENCOUNTER — Encounter: Payer: Self-pay | Admitting: Student

## 2022-02-12 ENCOUNTER — Ambulatory Visit: Payer: Medicare Other | Attending: Student | Admitting: Student

## 2022-02-12 VITALS — BP 130/70 | HR 97 | Ht 74.0 in | Wt 273.0 lb

## 2022-02-12 DIAGNOSIS — I480 Paroxysmal atrial fibrillation: Secondary | ICD-10-CM

## 2022-02-12 DIAGNOSIS — I38 Endocarditis, valve unspecified: Secondary | ICD-10-CM | POA: Diagnosis not present

## 2022-02-12 DIAGNOSIS — I739 Peripheral vascular disease, unspecified: Secondary | ICD-10-CM | POA: Diagnosis not present

## 2022-02-12 DIAGNOSIS — E785 Hyperlipidemia, unspecified: Secondary | ICD-10-CM

## 2022-02-12 DIAGNOSIS — I5020 Unspecified systolic (congestive) heart failure: Secondary | ICD-10-CM | POA: Diagnosis not present

## 2022-02-12 DIAGNOSIS — I2510 Atherosclerotic heart disease of native coronary artery without angina pectoris: Secondary | ICD-10-CM

## 2022-02-12 NOTE — Patient Instructions (Signed)
Medication Instructions:  Your physician recommends that you continue on your current medications as directed. Please refer to the Current Medication list given to you today.  *If you need a refill on your cardiac medications before your next appointment, please call your pharmacy*   Lab Work: NONE   If you have labs (blood work) drawn today and your tests are completely normal, you will receive your results only by: [place] (if you have MyChart) OR A paper copy in the mail If you have any lab test that is abnormal or we need to change your treatment, we will call you to review the results.   Testing/Procedures: NONE    Follow-Up: At [facility], you and your health needs are our priority.  As part of our continuing mission to provide you with exceptional heart care, we have created designated Provider Care Teams.  These Care Teams include your primary Cardiologist (physician) and Advanced Practice Providers (APPs -  Physician Assistants and Nurse Practitioners) who all work together to provide you with the care you need, when you need it.  We recommend signing up for the patient portal called "MyChart".  Sign up information is provided on this After Visit Summary.  MyChart is used to connect with patients for Virtual Visits (Telemedicine).  Patients are able to view lab/test results, encounter notes, upcoming appointments, etc.  Non-urgent messages can be sent to your provider as well.   To learn more about what you can do with MyChart, go to [email or web address].    Your next appointment:    As Scheduled   The format for your next appointment:   In Person  Provider:   You may see Jenkins Rouge, MD or one of the following Advanced Practice Providers on your designated Care Team:   Bernerd Pho, PA-C  Ermalinda Barrios, PA-C     Other Instructions Thank you for choosing [facility]!    Important Information About Sugar

## 2022-02-16 ENCOUNTER — Ambulatory Visit: Payer: Self-pay | Admitting: *Deleted

## 2022-02-16 ENCOUNTER — Encounter: Payer: Self-pay | Admitting: *Deleted

## 2022-02-16 NOTE — Patient Instructions (Addendum)
Visit Information  Thank you for taking time to visit with me today. Please don't hesitate to contact me if I can be of assistance to you.   Following are the goals we discussed today:   Goals Addressed               This Visit's Progress     Patient Stated     manage atrial fibrillation [facility]) (pt-stated)   Not on track     Care Coordination Interventions: Reviewed importance of adherence to anticoagulant exactly as prescribed Afib action plan reviewed Screening for signs and symptoms of depression related to chronic disease state  Assessed social determinant of health barriers Reset his my chart password to [phone] to allow him to get back in it Discussed with him and wife that he was noted to be in persistent Atrial fibrillation during her 02/12/22 cardiology visit. Reviewed his preferences of discussed treatment (no additional medicine, ablation).  Provided RN CM office number. Encouraged outreach to RN CM, 24 hour nurse or pcp/cardiology office for any worsening symptoms Message to pcp providers Education sent via my chart on atrial fibrillation/prevention, cardiac ablation & cardioversion        Our next appointment is by telephone on 10/24 at 9:30 am  Please call the care guide team at [phone] if you need to cancel or reschedule your appointment.   If you are experiencing a Mental Health or [place] or need someone to talk to, please call the Suicide and Crisis Lifeline: 988 call the [place] National Suicide Prevention Lifeline: 860-780-6265 or TTY: (608)872-5840 TTY 901-220-8613) to talk to a trained counselor call 1-800-273-TALK (toll free, 24 hour hotline) call the [facility]: [phone] call 911   Patient verbalizes understanding of instructions and care plan provided today and agrees to view in [place]. Active MyChart status and patient understanding of how to access instructions and care plan via MyChart confirmed with  patient.     The patient has been provided with contact information for the care management team and has been advised to call with any health related questions or concerns.   Albia Lavina Hamman, RN, BSN, [place] Coordinator Office number [phone]

## 2022-02-16 NOTE — Patient Outreach (Addendum)
  Care Coordination   Initial Visit Note   02/16/2022 Name: [name] [name] MRN: [id number] DOB: 1951-02-11  [name] [name] is a 71 y.o. year old male who sees [name] [name], [name] Hawthorne, FNP for primary care. I spoke with  [name] [name] by phone today.  What matters to the patients health and wellness today?  Had episode of atrial fibrillation last night  Symptoms-  increase chest fluttering ,"could feel my chest beating", chest discomfort "in my neck like  I could feel it in my head" Denies shortness of breath, sweating, edema, disorientation Visited in laws & walked a lot on Sunday. He reports some anxiety related to visiting the in laws. Overall he & [name] [name] reports the visit did go well First episode like this in a couple of year,  Better today Follows up with pcp on 03/16/22 and cardiology, Dr [name] on 03/06/22    Goals Addressed               This Visit's Progress     Patient Stated     manage atrial fibrillation [facility]) (pt-stated)   Not on track     Care Coordination Interventions: Reviewed importance of adherence to anticoagulant exactly as prescribed Afib action plan reviewed Screening for signs and symptoms of depression related to chronic disease state  Assessed social determinant of health barriers Reset his my chart password to [phone] to allow him to get back in it Discussed with him and wife that he was noted to be in persistent Atrial fibrillation during her 02/12/22 cardiology visit. Reviewed his preferences of discussed treatment (no additional medicine, ablation).  Provided RN CM office number. Encouraged outreach to [employer] CM, 24 hour nurse or pcp/cardiology office for any worsening symptoms Message to pcp providers Education sent via my chart on atrial fibrillation/prevention, cardiac ablation & cardioversion        SDOH assessments and interventions completed:  Yes  SDOH Interventions Today    Flowsheet Row Most Recent Value  SDOH Interventions   Food Insecurity  Interventions Intervention Not Indicated  Housing Interventions Intervention Not Indicated  Transportation Interventions Intervention Not Indicated  Utilities Interventions Intervention Not Indicated  Stress Interventions Intervention Not Indicated        Care Coordination Interventions Activated:  Yes  Care Coordination Interventions:  Yes, provided   Follow up plan: Follow up call scheduled for 03/10/22    Encounter Outcome:  Pt. Visit Completed   Teriana Danker L. Lavina Hamman, RN, BSN, [place] Coordinator Office number [phone]

## 2022-02-19 ENCOUNTER — Other Ambulatory Visit: Payer: Self-pay | Admitting: Family Medicine

## 2022-02-19 ENCOUNTER — Other Ambulatory Visit: Payer: Medicare Other

## 2022-02-19 DIAGNOSIS — N179 Acute kidney failure, unspecified: Secondary | ICD-10-CM

## 2022-02-19 DIAGNOSIS — I70244 Atherosclerosis of native arteries of left leg with ulceration of heel and midfoot: Secondary | ICD-10-CM | POA: Diagnosis not present

## 2022-02-19 DIAGNOSIS — I50813 Acute on chronic right heart failure: Secondary | ICD-10-CM | POA: Diagnosis not present

## 2022-02-19 DIAGNOSIS — E1140 Type 2 diabetes mellitus with diabetic neuropathy, unspecified: Secondary | ICD-10-CM | POA: Diagnosis not present

## 2022-02-19 DIAGNOSIS — L97911 Non-pressure chronic ulcer of unspecified part of right lower leg limited to breakdown of skin: Secondary | ICD-10-CM | POA: Diagnosis not present

## 2022-02-19 DIAGNOSIS — E11622 Type 2 diabetes mellitus with other skin ulcer: Secondary | ICD-10-CM | POA: Diagnosis not present

## 2022-02-19 DIAGNOSIS — I739 Peripheral vascular disease, unspecified: Secondary | ICD-10-CM | POA: Diagnosis not present

## 2022-02-19 DIAGNOSIS — S81802D Unspecified open wound, left lower leg, subsequent encounter: Secondary | ICD-10-CM | POA: Diagnosis not present

## 2022-02-19 DIAGNOSIS — S81801D Unspecified open wound, right lower leg, subsequent encounter: Secondary | ICD-10-CM | POA: Diagnosis not present

## 2022-02-19 DIAGNOSIS — L97522 Non-pressure chronic ulcer of other part of left foot with fat layer exposed: Secondary | ICD-10-CM | POA: Diagnosis not present

## 2022-02-20 LAB — CMP14+EGFR
ALT: 12 IU/L (ref 0–44)
AST: 16 IU/L (ref 0–40)
Albumin/Globulin Ratio: 1.2 (ref 1.2–2.2)
Albumin: 4.1 g/dL (ref 3.8–4.8)
Alkaline Phosphatase: 64 IU/L (ref 44–121)
BUN/Creatinine Ratio: 14 (ref 10–24)
BUN: 19 mg/dL (ref 8–27)
Bilirubin Total: 0.6 mg/dL (ref 0.0–1.2)
CO2: 24 mmol/L (ref 20–29)
Calcium: 9.8 mg/dL (ref 8.6–10.2)
Chloride: 99 mmol/L (ref 96–106)
Creatinine, Ser: 1.35 mg/dL — ABNORMAL HIGH (ref 0.76–1.27)
Globulin, Total: 3.3 g/dL (ref 1.5–4.5)
Glucose: 134 mg/dL — ABNORMAL HIGH (ref 70–99)
Potassium: 4.9 mmol/L (ref 3.5–5.2)
Sodium: 138 mmol/L (ref 134–144)
Total Protein: 7.4 g/dL (ref 6.0–8.5)
eGFR: 56 mL/min/{1.73_m2} — ABNORMAL LOW (ref >59)

## 2022-02-20 LAB — CBC WITH DIFFERENTIAL/PLATELET
Basophils Absolute: 0.1 10*3/uL (ref 0.0–0.2)
Basos: 1 %
EOS (ABSOLUTE): 0.2 10*3/uL (ref 0.0–0.4)
Eos: 2 %
Hematocrit: 34.6 % — ABNORMAL LOW (ref 37.5–51.0)
Hemoglobin: 10.4 g/dL — ABNORMAL LOW (ref 13.0–17.7)
Immature Grans (Abs): 0.1 10*3/uL (ref 0.0–0.1)
Immature Granulocytes: 1 %
Lymphocytes Absolute: 1.6 10*3/uL (ref 0.7–3.1)
Lymphs: 17 %
MCH: 22.7 pg — ABNORMAL LOW (ref 26.6–33.0)
MCHC: 30.1 g/dL — ABNORMAL LOW (ref 31.5–35.7)
MCV: 75 fL — ABNORMAL LOW (ref 79–97)
Monocytes Absolute: 0.9 10*3/uL (ref 0.1–0.9)
Monocytes: 9 %
Neutrophils Absolute: 6.6 10*3/uL (ref 1.4–7.0)
Neutrophils: 70 %
Platelets: 338 10*3/uL (ref 150–450)
RBC: 4.59 x10E6/uL (ref 4.14–5.80)
RDW: 15.1 % (ref 11.6–15.4)
WBC: 9.3 10*3/uL (ref 3.4–10.8)

## 2022-02-20 LAB — RENAL FUNCTION PANEL: Phosphorus: 3.1 mg/dL (ref 2.8–4.1)

## 2022-02-24 ENCOUNTER — Ambulatory Visit: Payer: Medicare Other | Admitting: Student

## 2022-02-24 DIAGNOSIS — S81801D Unspecified open wound, right lower leg, subsequent encounter: Secondary | ICD-10-CM | POA: Diagnosis not present

## 2022-02-24 DIAGNOSIS — L97911 Non-pressure chronic ulcer of unspecified part of right lower leg limited to breakdown of skin: Secondary | ICD-10-CM | POA: Diagnosis not present

## 2022-02-24 DIAGNOSIS — I739 Peripheral vascular disease, unspecified: Secondary | ICD-10-CM | POA: Diagnosis not present

## 2022-02-24 DIAGNOSIS — E11622 Type 2 diabetes mellitus with other skin ulcer: Secondary | ICD-10-CM | POA: Diagnosis not present

## 2022-02-24 DIAGNOSIS — I50813 Acute on chronic right heart failure: Secondary | ICD-10-CM | POA: Diagnosis not present

## 2022-02-24 DIAGNOSIS — S81802D Unspecified open wound, left lower leg, subsequent encounter: Secondary | ICD-10-CM | POA: Diagnosis not present

## 2022-02-24 DIAGNOSIS — I70244 Atherosclerosis of native arteries of left leg with ulceration of heel and midfoot: Secondary | ICD-10-CM | POA: Diagnosis not present

## 2022-02-24 DIAGNOSIS — E1140 Type 2 diabetes mellitus with diabetic neuropathy, unspecified: Secondary | ICD-10-CM | POA: Diagnosis not present

## 2022-02-24 DIAGNOSIS — L97522 Non-pressure chronic ulcer of other part of left foot with fat layer exposed: Secondary | ICD-10-CM | POA: Diagnosis not present

## 2022-02-25 DIAGNOSIS — E1129 Type 2 diabetes mellitus with other diabetic kidney complication: Secondary | ICD-10-CM | POA: Diagnosis not present

## 2022-02-25 DIAGNOSIS — R809 Proteinuria, unspecified: Secondary | ICD-10-CM | POA: Diagnosis not present

## 2022-02-25 DIAGNOSIS — I5042 Chronic combined systolic (congestive) and diastolic (congestive) heart failure: Secondary | ICD-10-CM | POA: Diagnosis not present

## 2022-02-25 DIAGNOSIS — D638 Anemia in other chronic diseases classified elsewhere: Secondary | ICD-10-CM | POA: Diagnosis not present

## 2022-02-25 DIAGNOSIS — N189 Chronic kidney disease, unspecified: Secondary | ICD-10-CM | POA: Diagnosis not present

## 2022-02-25 DIAGNOSIS — I129 Hypertensive chronic kidney disease with stage 1 through stage 4 chronic kidney disease, or unspecified chronic kidney disease: Secondary | ICD-10-CM | POA: Diagnosis not present

## 2022-02-25 DIAGNOSIS — E1122 Type 2 diabetes mellitus with diabetic chronic kidney disease: Secondary | ICD-10-CM | POA: Diagnosis not present

## 2022-03-02 ENCOUNTER — Ambulatory Visit: Payer: Medicare Other | Admitting: Neurology

## 2022-03-02 ENCOUNTER — Other Ambulatory Visit: Payer: Self-pay | Admitting: Family

## 2022-03-02 ENCOUNTER — Encounter: Payer: Self-pay | Admitting: Neurology

## 2022-03-02 DIAGNOSIS — E1169 Type 2 diabetes mellitus with other specified complication: Secondary | ICD-10-CM

## 2022-03-02 NOTE — Progress Notes (Deleted)
[facility]    Provider:  Dr Jaynee Eagles Requesting Provider: [name], FNP Primary Care Physician:  [name], FNP    CC: TIA and memory loss  02/24/2021: Patient here for follow up of TIA in July. He has a past medical history of untreated sleep apnea, morbid obesity, B12 deficiency on supplementation, syncope, depression, intolerance to statin, leg ulcers status post popliteal bypass graft and femoral artery stenting.  Also cerebrovascular disease, headaches, chronic kidney disease, peripheral vascular disease, myocardial infarction status post stent July 30, 2016 follows with cardiology and is in a clinical trial for hyperlipidemia, followed by pain management every 2 to 3 months, chronic kidney disease followed by nephrology, followed by vascular and had stent placement in his right leg,, medical noncompliance, hypertension, hyperlipidemia, diabetes, depression, coronary artery disease, arthritis, anxiety, cerebrovascular disease, congestive heart failure, A. fib, diabetic peripheral neuropathy, lacunar infarct, degenerative disc disease, drug-induced myopathy, obesity, former smoker with greater than 30-pack-year history.  He declines to wear CPAP.  We have evaluated him in the past for multiple other disorders including cerebrovascular disease, lacunar strokes, noncompliance with CPAP and untreated obstructive sleep apnea with morning headaches.  Patient was seen at the emergency room November 23, 2020 for left upper extremity numbness and ataxia, he woke up with left thumb index and middle finger numbness, ataxic on PT evaluation, patient's Eliquis was held for a week prior to recent surgery of superficial femoral artery stenting, in that setting he awoke with numbness in his arm, multiple stroke risk factors, MRI of the brain was negative for acute abnormalities, but MRI of the head and neck showed severe distal left ICA near occlusive stenosis with a decreased flow in  the intracranial left ICA and MCA, chronic left VA occlusive severe stenosis bilateral cavernous ICAs, right ICA about 40 to 50% stenosis.  Symptoms resolved that the MRI of the head and neck concerning for new left ICA high-grade stenosis with downstream MCA decreased flow.  CTA was recommended but he declined.  He is back on Eliquis and Plavix.      HPI January 01, 2020: Patient here as a new request from [facility] for near syncope.  He has a past medical history of untreated sleep apnea, headaches, chronic kidney disease, peripheral vascular disease, myocardial infarction status post stent July 30, 2016 follows with cardiology and is in a clinical trial for hyperlipidemia, followed by pain management every 2 to 3 months, chronic kidney disease followed by nephrology, followed by vascular and had stent placement in his right leg,, medical noncompliance, hypertension, hyperlipidemia, diabetes, depression, coronary artery disease, arthritis, anxiety, cerebrovascular disease, congestive heart failure, A. fib, diabetic peripheral neuropathy, lacunar infarct, degenerative disc disease, drug-induced myopathy, obesity, former smoker with greater than 30-pack-year history.  He declines to wear CPAP.  We have evaluated him in the past for multiple other disorders including cerebrovascular disease, lacunar strokes, noncompliance with CPAP and untreated obstructive sleep apnea with morning headaches.  I reviewed Dr. Lenna Gilford notes: Patient had near syncope had a steroid injection about 2 weeks prior to appointment and had one episode of blacking out, blurred vision and headache.  He follows with cardiology and was referred back to [place] for near syncope.  He has a statin allergy.  The episode occurred June 12, his vision became dark and he felt weak, in the office his blood pressure was decreased, reviewed recent labs October 30, 2019 included a normal CBC, CMP with creatinine 1.56 and BUN 29, hemoglobin A1c  6.9.   He is  here with his wife today who also provides much information, he is having episodes where he passes out, last one was on June 26 he was in the truck, it was hot, he jumped out of the truck and had to sister and when he straightened up that is when it started, he could hear family asking questions but could not talk, he could not see at all after they got home, he did not go to the hospital. He has had 2 of them, last one was the worst, he has had 2 of them, the first one he was on a golf cart, he got up to come in the house and he made it to the living room, started getting lightheaded, felt like he was going to pass out, laying down can revent the passing out, for a few seconds, vision tunnels in, started wearing and looks pale, feels clammy. Blood pressures have been low. He doesn't drink a lot he has cut back on sodas. He went to cardiology and blood pressure is improved and hasn;t had an episode since June. He doesn't urinate on himself or lose bowel control or have abnormal movements with the syncopal episodes. No post-ictal confusion. He has untreated seep apnea. No episodes since adjusting his blood pressure medications. No focal weakness, speech changes, stroke-like episodes. No other focal neurologic deficits, associated symptoms, inciting events or modifiable factors.During episodes BP has been as low as 80/56. He feel foggy, memory loss. FHx of dementia. Driving, not getting lost, no accidents, still performing all your ADLS IADLs, no personality changes or hallucinations or delusions.   CC:  Headaches, untreated sleep apnea  Interval history 03/15/2019: He wakes up with headaches and goes to sleep with headaches. He has pain pills . It can be anywhere, on either side, both sides, pounding, it gets better during the day. He wakes up with headaches. No hx of migraines. No significant l ight sensitivity, no sound sensitivity, no nausea or vomiting. They can't travel, the virus has been difficult, he  wanted to travel, he is stuck at home. The headache is throbbing. No tearing of the eyes or other autonomic symptoms. Can hurt right behind the eye. It is most days. It has gotten better lately but can be bad. Also the pollen was bad. He woke up one morning at 4am with a headache, his ear was covered with blood. Headaches associated with pressure in the sinuses and ear.   Reviewed CT Head: 02/20/2019: Stable left basal ganglia lacunar infarct. Mild atrophic changes are noted commenced with the patient's given age. No findings to suggest acute hemorrhage, acute infarction or space-occupying mass lesion are noted.   Vascular: No hyperdense vessel or unexpected calcification.   Skull: Normal. Negative for fracture or focal lesion.   Sinuses/Orbits: No acute finding.   Other: None.   IMPRESSION: Chronic changes as described. No acute intracranial abnormality is noted.    Interval history 03/14/2019: 71 y.o. male here as requested by [name], FNP for intractable headache. PMHx medical noncompliance, hypertension, hyperlipidemia, myocardial infarction, disorders of iron metabolism, diabetes, depression, cellulitis, arthritis, anxiety, coronary artery disease, occlusion and stenosis of vertebral artery, congestive heart failure, A. fib, periodontal disease, diabetic neuropathy. He is seeing my colleague Dr. Brett Fairy for sleep apnea. However despite aoneas and snoring, he states he would never wear a cpap. He has been a many-decade long smoker.   Interval History 03/29/2018: GARNELL PHENIX is a 71 y.o. male here  as a referral from Dr. Lenna Gilford for atherosclerotic disease. PMHx HTN, diabetes, diabetic neuropathy, HLD, depression, obesity, afib. Not smoking. He is morbidly obese. He still snores. Not wearing cpap. He is having a sleep consultation Thursday. Discussed weight loss. Discussed trying to manage his diabetes closely, hga1c 8 (goal is <7),  Discussed weight and weight loss and he declines a  referral to the [facility] [place]. Triglycerides too high to calculate LDL, he sees cardiology and is in a study for his cholesterol discussed ldl goal < 70, untreated sleep apnea. No syncopal episodes since stopping metoprolol. No new events.    reviewed images of CT 01/2018 and agree with the following:  IMPRESSION: 1. No acute intracranial process. 2. Old LEFT basal ganglia lacunar infarct, otherwise negative non-contrast CT HEAD for age.  Interval history 01/13/2017: Patient is here with his wife, he is lovely. He has good days and bad days, he has 1-2 cigarrettes every 2 weeks, he does not drink alcohol. He is on Eliquis and Plavix. Loop recorder identified afib. He has palpitations and yesterday he gets short of breath.His metoprolol was increased. He has shallow breathing and stops breathing at night.    HPI 05/24/2016:  ARTHA CHIASSON is a 71 y.o. male here as a referral from Dr. Lenna Gilford for atherosclerotic disease. PMHx HTN, diabetes, diabetic neuropathy, HLD, depression, obesity. Patient is here after admission to the [facility] in early January of this year for visual field changes, blurry vision, Sharp pain behind his right eye and right-sided headache.He was admitted to telemetry for further evaluation. Carotid ultrasound did not show any significant ICA stenosis. MRI of the brain without contrast showed chronic lacunar infarcts but nothing new. Imaging of the vessels showed atheromatous disease. EEG was normal. He did have a complete stroke workup and he is here for follow-up today. He was changed from aspirin to Plavix inpatient.   He stopped taking the Plavix. He has gone back to taking a baby aspirin. He is trying to manage his diabetes. He is on Trulicity now and working on Mudlogger with pcp. He is taking his glucose multiple times a day and adjusting insulin sliding scale. He tried a statin again and he can't tolerate it and stopped it. He is trying to eat better.  He sees Administrator, arts.  He had some right sided sharp neck pain that radiated down the right arm. Since he left the hospital he can hear his heart beat in his neck radiating to the right ear where the sharp pain was (points to the carotid on the right)   Reviewed notes, labs and imaging from outside physicians, which showed:   HgbA1c 10 05/2016 Triglycerides 647 Cholesterol 239 Cannot calculat LDL   MRI brain:   IMPRESSION: 1. No acute intracranial abnormality. 2. Chronic left basal ganglia lacunar infarct.   MRA head: 1. No visible flow in the left V4 segment. See MRA neck that is reported separately. 2. High-grade atheromatous narrowings at the left ICA anterior genu, bilateral M2 branches, and bilateral P2 branches.   MRA neck : 1. Thready flow within the left vertebral artery which is new compared to 2015 imaging. Suspect a high-grade proximal left subclavian stenosis favoring steal phenomenon. Dissection is the main differential consideration. CTA is recommended to further evaluate. 2. Cervical carotid atherosclerosis with ~ 40% proximal left ICA Stenosis.   CT Angio of the neck: 1. Left V3 segment occlusion. Distal left V4 reconstitution by the basilar with patent  left PICA. 2. Diffuse narrowing and luminal irregularity of the left vertebral artery. Correlate for neck pain as dissection or atherosclerosis and underfilling could give this appearance. 3. Mild atherosclerotic narrowing of the left subclavian ostium. No subclavian flow limiting stenosis. 4. Cervical carotid atherosclerosis with 30 to 40 % ICA narrowing on the left.    Review of Systems:  Patient complains of symptoms per HPI as well as the following symptoms: short-term memory loss . Pertinent negatives and positives per HPI. All others negative    Social History   Socioeconomic History   Marital status: Married    Spouse name: Belenda Cruise   Number of children: 2   Years of education:  12   Highest education level: 12th grade  Occupational History   Occupation: retired  Tobacco Use   Smoking status: Former    Packs/day: 0.25    Years: 51.00    Total pack years: 12.75    Types: Cigarettes    Quit date: 08/14/2016    Years since quitting: 5.5   Smokeless tobacco: Never   Tobacco comments:    smokes  a pack a week  Vaping Use   Vaping Use: Never used  Substance and Sexual Activity   Alcohol use: No    Alcohol/week: 0.0 standard drinks of alcohol   Drug use: No   Sexual activity: Yes  Other Topics Concern   Not on file  Social History Narrative   Lives with his wife.  Retired.  Unable to afford expensive medicines.     He has 2 children from previous marriage - they live in [place]       Caffeine: 1 cup of 1/2 caff coffee in AM, drinks more if at a restaurant    Social Determinants of Health   Financial Resource Strain: Medium Risk (01/06/2021)   Overall Financial Resource Strain (CARDIA)    Difficulty of Paying Living Expenses: Somewhat hard  Food Insecurity: No Food Insecurity (02/16/2022)   Hunger Vital Sign    Worried About Running Out of Food in the Last Year: Never true    Ran Out of Food in the Last Year: Never true  Transportation Needs: No Transportation Needs (02/16/2022)   PRAPARE - Hydrologist (Medical): No    Lack of Transportation (Non-Medical): No  Physical Activity: Insufficiently Active (01/06/2021)   Exercise Vital Sign    Days of Exercise per Week: 7 days    Minutes of Exercise per Session: 10 min  Stress: No Stress Concern Present (02/16/2022)   [place]    Feeling of Stress : Only a little  Social Connections: Moderately Isolated (01/06/2021)   Social Connection and Isolation Panel [NHANES]    Frequency of Communication with Friends and Family: More than three times a week    Frequency of Social Gatherings with Friends and Family: Twice  a week    Attends Religious Services: Never    Marine scientist or Organizations: No    Attends Archivist Meetings: Never    Marital Status: Married  Human resources officer Violence: Not At Risk (02/16/2022)   Humiliation, Afraid, Rape, and Kick questionnaire    Fear of Current or Ex-Partner: No    Emotionally Abused: No    Physically Abused: No    Sexually Abused: No    Family History  Problem Relation Age of Onset   Diabetes Father    Valvular heart disease Father  Arthritis Father    Heart disease Father    Stroke Father    Alzheimer's disease Mother    Hyperlipidemia Mother    Hypertension Mother    Arthritis Mother    Lung cancer Mother    Stroke Mother    Headache Mother    Arthritis/Rheumatoid Sister    Diabetes Sister    Hypertension Sister    Hyperlipidemia Sister    Depression Sister    Dementia Maternal Aunt    Dementia Maternal Uncle    Heart disease Maternal Uncle    Stomach cancer Paternal Uncle    Colon cancer Neg Hx    Liver disease Neg Hx     Past Medical History:  Diagnosis Date   Anxiety    Arthritis    Atrial fibrillation ([place])    CAD (coronary artery disease)    a. 2010: DES to CTO of RCA. EF 55% b. 07/2016: cath showing total occlusion within previously placed RCA stent (collaterals present), severe stenosis along LCx and OM1 (treated with 2 overlapping DES). c. repeat cath in 01/2018 showing patent stents along LCx and OM with CTO of D2, CTO of distal LCx, and CTO of RCA with collaterals present overall unchanged since 2018 with medical management recom   Cellulitis and abscess rt groin    Complication of anesthesia    " I woke up during a colonoscopy "      Depression    Diabetes mellitus    Diastolic CHF ([place])    Disorders of iron metabolism    Dysrhythmia    Fibromyalgia    GERD (gastroesophageal reflux disease)    History of hiatal hernia    Hyperlipidemia    Hypertension    Low serum testosterone level    Medically  noncompliant    Myocardial infarction ([place])    05-23-20   Pneumonia     Past Surgical History:  Procedure Laterality Date   ABDOMINAL AORTOGRAM W/LOWER EXTREMITY N/A 03/21/2019   Procedure: ABDOMINAL AORTOGRAM W/LOWER EXTREMITY;  Surgeon: [name], MD;  Location: [place] [facility];  Service: Cardiovascular;  Laterality: N/A;   ABDOMINAL AORTOGRAM W/LOWER EXTREMITY N/A 11/12/2020   Procedure: ABDOMINAL AORTOGRAM W/LOWER EXTREMITY;  Surgeon: [name], MD;  Location: [place] [facility];  Service: Cardiovascular;  Laterality: N/A;   ABDOMINAL AORTOGRAM W/LOWER EXTREMITY N/A 01/28/2021   Procedure: ABDOMINAL AORTOGRAM W/LOWER EXTREMITY;  Surgeon: [name], MD;  Location: [place] [facility];  Service: Cardiovascular;  Laterality: N/A;   ABDOMINAL AORTOGRAM W/LOWER EXTREMITY Bilateral 07/29/2021   Procedure: ABDOMINAL AORTOGRAM W/LOWER EXTREMITY;  Surgeon: [name], MD;  Location: [place] [facility];  Service: Cardiovascular;  Laterality: Bilateral;   ANGIOPLASTY N/A 08/15/2021   Procedure: ATTEMPTED RIGHT PERONEAL  ANGIOPLASTY, LEFT PERONEAL ANGIOPLASTY;  Surgeon: [name], MD;  Location: [place];  Service: Vascular;  Laterality: N/A;   BACK SURGERY  2015   ACDF by Dr. Orland Jarred STUDY  12/23/2021   Procedure: BUBBLE STUDY;  Surgeon: Elouise Munroe, MD;  Location: [facility] END[facility]COPY;  Service: Cardiology;;   COLONOSCOPY N/A 10/01/2014   Dr. [name]: multiple tubular adenomas removed, colonic diverticulosis, redundant colon. next tcs advised for 09/2017. PATIENT NEEDS PROPOFOL FOR FAILED CONSCIOUS SEDATION   CORONARY STENT INTERVENTION N/A 07/30/2016   Procedure: Coronary Stent Intervention;  Surgeon: [name], MD;  Location: [place] [facility];  Service: Cardiovascular;  Laterality: N/A;   CORONARY STENT PLACEMENT  2000   By  Dr. Olevia Perches   EP IMPLANTABLE DEVICE N/A 05/25/2016   Procedure: Loop Recorder Insertion;  Surgeon: [name], MD;  Location: [place]  [facility];  Service: Cardiovascular;  Laterality: N/A;   ESOPHAGOGASTRODUODENOSCOPY     esophagus stretched remotely at [facility]   ESOPHAGOGASTRODUODENOSCOPY N/A 10/01/2014   Dr. [name]: patchy mottling/erythema and minimal polypoid appearance of gastric mucosa. bx with mild inlammation but no H.pylori   FEMORAL-POPLITEAL BYPASS GRAFT Left 11/22/2020   Procedure: LEFT FEMORAL-POPLITEAL BYPASS GRAFT;  Surgeon: [name], MD;  Location: [facility];  Service: Vascular;  Laterality: Left;   FEMORAL-POPLITEAL BYPASS GRAFT Left 08/15/2021   Procedure: REDO LEFT FEMORAL-POPLITEAL BYPASS USING PROPATEN GRAFT;  Surgeon: [name], MD;  Location: [place];  Service: Vascular;  Laterality: Left;   HERNIA REPAIR  4128   umbilical   INSERTION OF ILIAC STENT Right 11/22/2020   Procedure: INSERTION OF ELUVIA STENT INTO RIGHT DISTAL SUPERFICIAL FEMORAL ARTERY;  Surgeon: [name], MD;  Location: [place];  Service: Vascular;  Laterality: Right;   LEFT HEART CATH AND CORONARY ANGIOGRAPHY N/A 07/30/2016   Procedure: Left Heart Cath and Coronary Angiography;  Surgeon: [name], MD;  Location: [place] [facility];  Service: Cardiovascular;  Laterality: N/A;   LEFT HEART CATH AND CORONARY ANGIOGRAPHY N/A 01/19/2018   Procedure: LEFT HEART CATH AND CORONARY ANGIOGRAPHY;  Surgeon: [name], MD;  Location: [place] [facility];  Service: Cardiovascular;  Laterality: N/A;   LEFT HEART CATH AND CORONARY ANGIOGRAPHY N/A 05/24/2020   Procedure: LEFT HEART CATH AND CORONARY ANGIOGRAPHY;  Surgeon: Burnell Blanks, MD;  Location: [place] [facility];  Service: Cardiovascular;  Laterality: N/A;   LEFT HEART CATH AND CORONARY ANGIOGRAPHY N/A 08/06/2021   Procedure: LEFT HEART CATH AND CORONARY ANGIOGRAPHY;  Surgeon: Burnell Blanks, MD;  Location: [place] [facility];  Service: Cardiovascular;  Laterality: N/A;   LESION REMOVAL     Lip and hand    LOWER EXTREMITY ANGIOGRAM Right 11/22/2020   Procedure: RIGHT  LEG ANGIOGRAM;  Surgeon: [name], MD;  [facility];  Service: Vascular;  Laterality: Right;   LOWER EXTREMITY ANGIOGRAM Right 08/15/2021   Procedure: RIGHT LOWER EXTREMITY ANGIOGRAM;  Surgeon: [name], MD;  [facility];  Service: Vascular;  Laterality: Right;   LOWER EXTREMITY ANGIOGRAPHY N/A 04/18/2019   Procedure: LOWER EXTREMITY ANGIOGRAPHY;  Surgeon: [name], MD;  Location: [place] [facility];  Service: Cardiovascular;  Laterality: N/A;   LOWER EXTREMITY ANGIOGRAPHY N/A 08/18/2021   Procedure: Lower Extremity Angiography;  Surgeon: Waynetta Sandy, MD;  Location: [place] [facility];  Service: Cardiovascular;  Laterality: N/A;   NECK SURGERY     PERIPHERAL VASCULAR BALLOON ANGIOPLASTY  04/18/2019   Procedure: PERIPHERAL VASCULAR BALLOON ANGIOPLASTY;  Surgeon: [name], MD;  Location: [place] [facility];  Service: Cardiovascular;;   PERIPHERAL VASCULAR BALLOON ANGIOPLASTY Left 11/12/2020   Procedure: PERIPHERAL VASCULAR BALLOON ANGIOPLASTY;  Surgeon: [name], MD;  Location: [place] [facility];  Service: Cardiovascular;  Laterality: Left;  Failed PTA of superficial femoral artery.   PERIPHERAL VASCULAR BALLOON ANGIOPLASTY Right 08/18/2021   Procedure: PERIPHERAL VASCULAR BALLOON ANGIOPLASTY;  Surgeon: Waynetta Sandy, MD;  Location: [place] [facility];  Service: Cardiovascular;  Laterality: Right;  peroneal   PERIPHERAL VASCULAR INTERVENTION Right 03/21/2019   Procedure: PERIPHERAL VASCULAR INTERVENTION;  Surgeon: [name], MD;  Location: [place] [facility];  Service: Cardiovascular;  Laterality: Right;  superficial femoral   PERIPHERAL VASCULAR INTERVENTION Right 08/18/2021   Procedure: PERIPHERAL VASCULAR INTERVENTION;  Surgeon: Waynetta Sandy, MD;  Location: [place] [facility];  Service: Cardiovascular;  Laterality: Right;  tibial peroneal trunk   TEE WITHOUT CARDIOVERSION N/A 12/23/2021   Procedure: TRANSESOPHAGEAL  ECHOCARDIOGRAM (TEE);  Surgeon: Elouise Munroe, MD;  Location: [place];  Service: Cardiology;  Laterality: N/A;    Current Outpatient Medications  Medication Sig Dispense Refill   albuterol (VENTOLIN HFA) 108 (90 Base) MCG/ACT inhaler Inhale 2 puffs into the lungs every 6 (six) hours as needed for wheezing or shortness of breath. 8.5 g 0   amoxicillin-clavulanate (AUGMENTIN) 875-125 MG tablet Take 1 tablet by mouth every 12 (twelve) hours. if fever >100.52F (Patient not taking: Reported on 02/10/2022) 10 tablet 0   busPIRone (BUSPAR) 7.5 MG tablet Take 1 tablet (7.5 mg total) by mouth 3 (three) times daily as needed. (Patient not taking: Reported on 02/10/2022) 90 tablet 2   Calcium Carbonate Antacid (ALKA-SELTZER ANTACID PO) Take 1 tablet by mouth 2 (two) times daily as needed (heartburn, indigestion).     clopidogrel (PLAVIX) 75 MG tablet TAKE 1 TABLET BY MOUTH DAILY (Patient taking differently: Take 75 mg by mouth daily.) 30 tablet 10   dapagliflozin propanediol (FARXIGA) 5 MG TABS tablet Take 1 tablet (5 mg total) by mouth daily before breakfast. 90 tablet 2   DULoxetine (CYMBALTA) 30 MG capsule Take 3 capsules (90 mg total) by mouth daily. (Patient taking differently: Take 90 mg by mouth at bedtime.) 120 capsule 1   ELIQUIS 5 MG TABS tablet TAKE ONE (1) TABLET BY MOUTH TWICE DAILY (Patient taking differently: Take 5 mg by mouth 2 (two) times daily.) 60 tablet 2   ezetimibe (ZETIA) 10 MG tablet Take 1 tablet (10 mg total) by mouth daily. 90 tablet 3   fenofibrate 160 MG tablet Take 1 tablet (160 mg total) by mouth daily. 90 tablet 3   furosemide (LASIX) 20 MG tablet Take 20 mg by mouth as needed.     gabapentin (NEURONTIN) 300 MG capsule TAKE 1 CAPSULE BY MOUTH 3 TIMES DAILY 90 capsule 10   HUMULIN 70/30 (70-30) 100 UNIT/ML injection INJECT 60 UNITS SUBCUTANEOUSLY SUBCUTANEOUSLY TWICE DAILY WITH A MEAL 40 mL 10   HYDROcodone-acetaminophen (NORCO) 10-325 MG tablet Take 1 tablet by mouth  every 4 (four) hours as needed for moderate pain. (Patient taking differently: Take 1 tablet by mouth 2 (two) times daily as needed (pain).) 30 tablet 0   insulin degludec (TRESIBA FLEXTOUCH) 200 UNIT/ML FlexTouch Pen Inject 16 Units into the skin at bedtime. 9 mL 2   insulin lispro (HUMALOG) 100 UNIT/ML KwikPen INJECT 15-20 UNITS SUBCUTANEOUSLY THREE TIMES A DAY WITH MEALS (Patient taking differently: 15-20 Units 3 (three) times daily. Sliding scale) 15 mL 10   isosorbide mononitrate (IMDUR) 30 MG 24 hr tablet TAKE 1 TABLET BY MOUTH DAILY (Patient taking differently: Take 30 mg by mouth daily.) 30 tablet 10   Menthol, Topical Analgesic, (BLUE-EMU MAXIMUM STRENGTH EX) Apply 1 application. topically daily as needed (pain).     metoprolol succinate (TOPROL-XL) 50 MG 24 hr tablet Take 1 tablet by mouth once daily 90 tablet 1   metoprolol tartrate (LOPRESSOR) 50 MG tablet Take 50 mg by mouth as needed.     nitroGLYCERIN (NITROSTAT) 0.4 MG SL tablet DISSOLVE ONE TABLET UNDER THE TONGUE EVERY 5 MINUTES AS NEEDED FOR CHEST PAIN.  DO NOT EXCEED A TOTAL OF 3 DOSES  IN 15 MINUTES Strength: 0.4 mg (Patient taking differently: Place 0.4 mg under the tongue every 5 (five) minutes x 3 doses as needed for chest pain.) 25 tablet 0   pantoprazole (PROTONIX) 40 MG tablet Take 1 tablet (40 mg total) by mouth 2 (two) times daily. 60 tablet 0   tirzepatide (MOUNJARO) 7.5 MG/0.5ML Pen Inject 7.5 mg into the skin once a week. (Patient taking differently: Inject 7.5 mg into the skin every Friday.) 6 mL 1   vitamin B-12 (CYANOCOBALAMIN) 1000 MCG tablet Take 1,000 mcg by mouth daily.     No current facility-administered medications for this visit.    Allergies as of 03/02/2022 - Review Complete 02/16/2022  Allergen Reaction Noted   Shellfish allergy Anaphylaxis, Hives, and Swelling 09/06/2014   Sulfa antibiotics Anaphylaxis, Hives, and Swelling 09/06/2014   Ace inhibitors Other (See Comments) and Cough 08/09/2013    Invokana [canagliflozin] Other (See Comments) 09/04/2013   Lexapro [escitalopram] Other (See Comments) 01/13/2016   Metformin and related Itching 07/12/2013   Pravachol [pravastatin sodium] Other (See Comments) 11/14/2014   Repatha [evolocumab] Other (See Comments) 01/04/2017   Tricor [fenofibrate] Other (See Comments) 11/14/2015   Zestril [lisinopril] Cough 10/04/2014   Crestor [rosuvastatin] Other (See Comments) 06/05/2013   Horse-derived products Rash 07/25/2008   Lipitor [atorvastatin] Other (See Comments) 06/05/2013   Livalo [pitavastatin] Other (See Comments) 06/25/2016   Milk (cow) Diarrhea and Nausea Only 04/06/2019   Tape Rash 07/25/2008    Vitals: There were no vitals taken for this visit. Last Weight:  Wt Readings from Last 1 Encounters:  02/12/22 273 lb (123.8 kg)   Last Height:   Ht Readings from Last 1 Encounters:  02/12/22 '6\' 2"'$  (1.88 m)   Exam: NAD, pleasant                  Speech:    Speech is normal; fluent and spontaneous with normal comprehension.  Cognition:    The patient is oriented to person, place, and time;     recent and remote memory intact;     language fluent;    Cranial Nerves:    The pupils are equal, round, and reactive to light.Trigeminal sensation is intact and the muscles of mastication are normal. The face is symmetric. The palate elevates in the midline. Hearing intact. Voice is normal. Shoulder shrug is normal. The tongue has normal motion without fasciculations.   Coordination:  No dysmetria. Walks with walking aid, wide based.  Motor Observation:    No asymmetry, no atrophy, and no involuntary movements noted. Tone:    Normal muscle tone.     Strength:    Strength is V/V in the upper and lower limbs.      Sensation: decreased distally in LE in all sensory modalities.   DTRs: Absent AJs   Assessment/Plan: Absolutely delightful gentleman with his lovely wife here again for TIA and memory loss, we have seen him before for  multiple complaints here for follow up after TIA in the setting of stopping his Eliquis for surgery. Extensive PMHx, and has been complaining of memory loss for several years to me  Patient here for follow up of TIA in July. He has a past medical history of untreated sleep apnea, morbid obesity, B12 deficiency on supplementation, syncope, depression, intolerance to statin, leg ulcers status post popliteal bypass graft and femoral artery stenting.  Also cerebrovascular disease, headaches, chronic kidney disease, peripheral vascular disease, myocardial infarction status post stent July 30, 2016 follows with cardiology  and is in a clinical trial for hyperlipidemia, followed by pain management every 2 to 3 months, chronic kidney disease followed by nephrology, followed by vascular and had stent placement in his right leg,, medical noncompliance, hypertension, hyperlipidemia, diabetes, depression, coronary artery disease, arthritis, anxiety, cerebrovascular disease, congestive heart failure, A. fib, diabetic peripheral neuropathy, lacunar infarct, degenerative disc disease, drug-induced myopathy, obesity, former smoker with greater than 30-pack-year history.  He declines to wear CPAP.  We have evaluated him in the past for multiple other disorders including cerebrovascular disease, lacunar strokes, noncompliance with CPAP and untreated obstructive sleep apnea with morning headaches.syncope an pre-syncope in the setting of hypotension as low as 80/56 improved with management of blood pressure medications last episodes in June. Today appears to be fine.    - Chronic diffuse cerebrovascular disease would avoid hypotension goal >026 systolic. Patient's last MRA head and neck was concerning for new left ICA high-grade stenosis in the neck with downstream MCA decreased flow(not consistent with his left-sided numbness and TIA).  Dr. Erlinda Hong recommend CTA head and neck for further evaluation of cerebral vasculature outpatient.  Even if I could convince him to get a CTA, at this point unclear if Dr. [name] would even consider intervention, reached out to him. Patient likely won't agree to to CTA or [place] he states.  - Memory loss: I don;t appreciate neurodegenerative disease ossible MCI but given his extensive medical history and FHx of alzheimers and his repeated requests for testing will accommodate, could be multifactorial including medications, morbid obesity, sedentary lifestyle, untreated sleep apnea. He has a family history of dementia and has expressed concerns to me for several years. At this time I will refer him to formal neurocognitive testing for baseline.   -syncope in the past: Patient with orthostatic hypotension discussed causes and treatments: discussed (see below). Needs continues follow up with primary care for medication management.   - He has untreated sleep apnea and declines being treated, We spent an extended amount of time discussing it again today. He has said in the past he will accept the consequences of his untreated sleep apnea and morbid obesity and lifestyle and I told him these may be some of the consequences but not all, increased risk of stroke and dementia and others   - Discussed weight loss again. Still declines referral to [facility] and [place] again today  - manage diabetes closely, hga1c  goal is <7),   -  cholesterol discussed ldl goal < 70, on a clinical trial for his cholesterol - continue plavix and eliquis for stroke prevention - Afib: continue eliquis   Stroke an dvascular risk factors: I had a long d/w patient about his stroke risk, risk for recurrent stroke/TIAs, personally independently reviewed imaging studies and stroke evaluation results and answered questions.Continue Plavix and Eliquis for secondary stroke prevention and maintain strict control of hypertension with blood pressure goal below 130/90, diabetes with hemoglobin A1c goal below 6.5% and lipids with  LDL cholesterol goal below 70 mg/dL I also advised the patient to eat a healthy diet with plenty of whole grains, cereals, fruits and vegetables, exercise regularly and maintain ideal body weight .Followup in the future with me in  6 months or call earlier if necessary. With diffuse cerebrovascular disease would avoid hypotension. Discussed smoking cessation and obesity. Highly encouraged him to manage his vascular risk factors which are extensive. He is in a trial for his hyperlipidemia.   Discussed: Non-Drug Treatment for Low Blood Pressure on Standing in  the past Orthostatic hypotension: 1. Changing Postures:  Change posture slowly when getting up, especially in the morning  Hold on to something during the first few minutes after standing up. Do not start walking as soon as you get up from the chair  Avoid prolonged recumbency or lying down  Raise the head of the bed by 10 to 20 degrees  2. Exercise:  Perform Isotonic exercise, e.g.recumbent bike, pedaling movements while sitting in a chair  Avoid exercises where you have to strain   3. Avoid Pooling of blood in legs:  Wear custom-fitted elastic stockings. The ones which extend to the abdomen work even better. Consider wearing an abdominal binder.  Perform physical counter-maneuvers, such as crossing legs and tensing leg muscles.  4. Eating and Drinking:  Small meals are recommended. Avoid large meals. Avoid standing suddenly after a large meal  Avoid alcohol  Increase intake of fluids and regular salt. A daily intake of up to 10 grams of sodium per day and a fluid intake of 2.0 to 2.5 liters per day (8 to 10 glasses of water) is recommended.   Rapid (over 3 minutes) ingestion of approximately 0.5 liter (2 glasses of water) of tap water, raises blood pressure within 5 to 15 minutes and lasts for an hour.  5. Other Tips:  Avoid hot baths. Instead take warm baths  Maintain a BP record standing and lying down  If you are only any BP  lowering drugs (antihypertensives, diuretics, antidepressants, drugs for prostate, etc) ask your doctor to revisit the need to keep you on these drugs  If all non-drug therapy fails, ask your doctor about drug therapy   Follow-up with primary care physician.    Sarina Ill, MD  [facility] [address] [place], [place] [address]  Phone [phone] Fax [phone]  I spent more than 40 minutes of face-to-face and non-face-to-face time with patient on the  No diagnosis found.   diagnosis.  This included previsit chart review, lab review, study review, order entry, electronic health record documentation, patient education on the different diagnostic and therapeutic options, counseling and coordination of care, risks and benefits of management, compliance, or risk factor reduction

## 2022-03-03 DIAGNOSIS — L97522 Non-pressure chronic ulcer of other part of left foot with fat layer exposed: Secondary | ICD-10-CM | POA: Diagnosis not present

## 2022-03-03 DIAGNOSIS — I480 Paroxysmal atrial fibrillation: Secondary | ICD-10-CM | POA: Diagnosis not present

## 2022-03-03 DIAGNOSIS — I70244 Atherosclerosis of native arteries of left leg with ulceration of heel and midfoot: Secondary | ICD-10-CM | POA: Diagnosis not present

## 2022-03-03 DIAGNOSIS — I2510 Atherosclerotic heart disease of native coronary artery without angina pectoris: Secondary | ICD-10-CM | POA: Diagnosis not present

## 2022-03-03 DIAGNOSIS — I87312 Chronic venous hypertension (idiopathic) with ulcer of left lower extremity: Secondary | ICD-10-CM | POA: Diagnosis not present

## 2022-03-03 DIAGNOSIS — Z7901 Long term (current) use of anticoagulants: Secondary | ICD-10-CM | POA: Diagnosis not present

## 2022-03-03 DIAGNOSIS — I739 Peripheral vascular disease, unspecified: Secondary | ICD-10-CM | POA: Diagnosis not present

## 2022-03-03 DIAGNOSIS — E1140 Type 2 diabetes mellitus with diabetic neuropathy, unspecified: Secondary | ICD-10-CM | POA: Diagnosis not present

## 2022-03-03 DIAGNOSIS — I50813 Acute on chronic right heart failure: Secondary | ICD-10-CM | POA: Diagnosis not present

## 2022-03-03 DIAGNOSIS — L97821 Non-pressure chronic ulcer of other part of left lower leg limited to breakdown of skin: Secondary | ICD-10-CM | POA: Diagnosis not present

## 2022-03-05 ENCOUNTER — Ambulatory Visit: Payer: Medicare Other | Admitting: Pharmacist

## 2022-03-05 DIAGNOSIS — I70209 Unspecified atherosclerosis of native arteries of extremities, unspecified extremity: Secondary | ICD-10-CM

## 2022-03-06 ENCOUNTER — Ambulatory Visit: Payer: Medicare Other | Admitting: Cardiovascular Disease

## 2022-03-08 ENCOUNTER — Other Ambulatory Visit: Payer: Self-pay | Admitting: Family

## 2022-03-09 ENCOUNTER — Ambulatory Visit (HOSPITAL_COMMUNITY)
Admission: RE | Admit: 2022-03-09 | Discharge: 2022-03-09 | Disposition: A | Discharge status: Home / Self Care | Payer: Medicare Other | Source: Ambulatory Visit | Attending: Surgery | Admitting: Surgery

## 2022-03-09 ENCOUNTER — Ambulatory Visit (INDEPENDENT_AMBULATORY_CARE_PROVIDER_SITE_OTHER): Payer: Medicare Other | Admitting: Surgery

## 2022-03-09 ENCOUNTER — Ambulatory Visit (INDEPENDENT_AMBULATORY_CARE_PROVIDER_SITE_OTHER)
Admission: RE | Admit: 2022-03-09 | Discharge: 2022-03-09 | Disposition: A | Discharge status: Home / Self Care | Payer: Medicare Other | Source: Ambulatory Visit | Attending: Surgery | Admitting: Surgery

## 2022-03-09 ENCOUNTER — Other Ambulatory Visit: Payer: Self-pay | Admitting: Family

## 2022-03-09 ENCOUNTER — Encounter: Payer: Self-pay | Admitting: Surgery

## 2022-03-09 VITALS — BP 114/65 | HR 92 | Temp 98.1°F | Resp 20 | Ht 74.0 in | Wt 274.0 lb

## 2022-03-09 DIAGNOSIS — I7025 Atherosclerosis of native arteries of other extremities with ulceration: Secondary | ICD-10-CM

## 2022-03-09 DIAGNOSIS — I739 Peripheral vascular disease, unspecified: Secondary | ICD-10-CM

## 2022-03-09 NOTE — Progress Notes (Signed)
[facility]  Patient name: [name] [name] MRN: [id number] DOB: May 07, 1951 Sex: male   REASON FOR VISIT:    Follow up  HISOTRY OF PRESENT ILLNESS:    [name] [name] is a 71 y.o. male is here today for follow-up.  I have been following him for bilateral wounds which have been improving. He has undergone the following procedures:   03/21/2019: Right superficial femoral artery stent (claudication) 04/18/2019: Failed angioplasty, left superficial femoral artery occlusion (claudication) 11/12/2020: Failed angioplasty, left superficial femoral artery (ulcer) 11/22/2020: Left femoral to above-knee popliteal artery bypass graft with vein, right superficial femoral artery stent (bilateral ulcers 01/28/2021: Abdominal aortogram with bilateral runoff 08/15/2021: Left femoral to below-knee popliteal artery bypass graft with 6 mm PTFE 08/18/2021: Retrograde peroneal cannulation with right tibioperoneal trunk stent   Returns today for blood flow and wound check.  The patient has a history of coronary artery disease.  He underwent DES in 2010 in 2018.  He suffers from atrial fibrillation.  He is on Eliquis and has a loop recorder implanted.  He is statin and Repatha intolerant.  He is currently involved in a research study to address his hypercholesterolemia.  He is a diabetic. PAST MEDICAL HISTORY:   Past Medical History:  Diagnosis Date   Anxiety    Arthritis    Atrial fibrillation (HCC)    CAD (coronary artery disease)    a. 2010: DES to CTO of RCA. EF 55% b. 07/2016: cath showing total occlusion within previously placed RCA stent (collaterals present), severe stenosis along LCx and OM1 (treated with 2 overlapping DES). c. repeat cath in 01/2018 showing patent stents along LCx and OM with CTO of D2, CTO of distal LCx, and CTO of RCA with collaterals present overall unchanged since 2018 with medical management recom   Cellulitis and abscess  rt groin    Complication of anesthesia    " I woke up during a colonoscopy "      Depression    Diabetes mellitus    Diastolic CHF ([place])    Disorders of iron metabolism    Dysrhythmia    Fibromyalgia    GERD (gastroesophageal reflux disease)    History of hiatal hernia    Hyperlipidemia    Hypertension    Low serum testosterone level    Medically noncompliant    Myocardial infarction [facility])    05-23-20   Pneumonia      FAMILY HISTORY:   Family History  Problem Relation Age of Onset   Diabetes Father    Valvular heart disease Father    Arthritis Father    Heart disease Father    Stroke Father    Alzheimer's disease Mother    Hyperlipidemia Mother    Hypertension Mother    Arthritis Mother    Lung cancer Mother    Stroke Mother    Headache Mother    Arthritis/Rheumatoid Sister    Diabetes Sister    Hypertension Sister    Hyperlipidemia Sister    Depression Sister    Dementia Maternal Aunt    Dementia Maternal Uncle    Heart disease Maternal Uncle    Stomach cancer Paternal Uncle    Colon cancer Neg Hx    Liver disease Neg Hx     SOCIAL HISTORY:   Social History   Tobacco Use   Smoking status: Former    Packs/day: 0.25    Years: 51.00    Total pack years: 12.75  Types: Cigarettes    Quit date: 08/14/2016    Years since quitting: 5.5   Smokeless tobacco: Never   Tobacco comments:    smokes  a pack a week  Substance Use Topics   Alcohol use: No    Alcohol/week: 0.0 standard drinks of alcohol     ALLERGIES:   Allergies  Allergen Reactions   Shellfish Allergy Anaphylaxis, Hives and Swelling    Tongue swelling    Sulfa Antibiotics Anaphylaxis, Hives and Swelling    Tongue swelling   Ace Inhibitors Other (See Comments) and Cough    CKD, renal failure    Invokana [Canagliflozin] Other (See Comments)    Syncope Dehydration    Lexapro [Escitalopram] Other (See Comments)    Buzzing in ears Headaches "Felt like a zombie"   Metformin And Related  Itching   Pravachol [Pravastatin Sodium] Other (See Comments)    Myalgias    Repatha [Evolocumab] Other (See Comments)    Myalgias Flu like symptoms    Tricor [Fenofibrate] Other (See Comments)    Myalgias     Zestril [Lisinopril] Cough   Crestor [Rosuvastatin] Other (See Comments)    Myalgias    Horse-Derived Products Rash    horse serum   Lipitor [Atorvastatin] Other (See Comments)    Myalgias    Livalo [Pitavastatin] Other (See Comments)    Myalgias    Milk (Cow) Diarrhea and Nausea Only    Stomach upset    Tape Rash     CURRENT MEDICATIONS:   Current Outpatient Medications  Medication Sig Dispense Refill   albuterol (VENTOLIN HFA) 108 (90 Base) MCG/ACT inhaler Inhale 2 puffs into the lungs every 6 (six) hours as needed for wheezing or shortness of breath. 8.5 g 0   amoxicillin-clavulanate (AUGMENTIN) 875-125 MG tablet Take 1 tablet by mouth every 12 (twelve) hours. if fever >100.68F 10 tablet 0   busPIRone (BUSPAR) 7.5 MG tablet Take 1 tablet (7.5 mg total) by mouth 3 (three) times daily as needed. 90 tablet 2   Calcium Carbonate Antacid (ALKA-SELTZER ANTACID PO) Take 1 tablet by mouth 2 (two) times daily as needed (heartburn, indigestion).     clopidogrel (PLAVIX) 75 MG tablet TAKE 1 TABLET BY MOUTH DAILY (Patient taking differently: Take 75 mg by mouth daily.) 30 tablet 10   DULoxetine (CYMBALTA) 30 MG capsule Take 3 capsules (90 mg total) by mouth daily. (Patient taking differently: Take 90 mg by mouth at bedtime.) 120 capsule 1   ELIQUIS 5 MG TABS tablet TAKE ONE (1) TABLET BY MOUTH TWICE DAILY (Patient taking differently: Take 5 mg by mouth 2 (two) times daily.) 60 tablet 2   ezetimibe (ZETIA) 10 MG tablet Take 1 tablet (10 mg total) by mouth daily. 90 tablet 3   fenofibrate 160 MG tablet TAKE 1 TABLET BY MOUTH DAILY 30 tablet 0   furosemide (LASIX) 20 MG tablet Take 20 mg by mouth as needed.     gabapentin (NEURONTIN) 300 MG capsule TAKE 1 CAPSULE BY MOUTH 3 TIMES  DAILY 90 capsule 10   HUMULIN 70/30 (70-30) 100 UNIT/ML injection INJECT 60 UNITS SUBCUTANEOUSLY TWICE DAILY WITH MEALS 30 mL 0   HYDROcodone-acetaminophen (NORCO) 10-325 MG tablet Take 1 tablet by mouth every 4 (four) hours as needed for moderate pain. (Patient taking differently: Take 1 tablet by mouth 2 (two) times daily as needed (pain).) 30 tablet 0   insulin degludec (TRESIBA FLEXTOUCH) 200 UNIT/ML FlexTouch Pen Inject 16 Units into the skin at bedtime. 9 mL  2   insulin lispro (HUMALOG) 100 UNIT/ML KwikPen INJECT 15-20 UNITS SUBCUTANEOUSLY THREE TIMES A DAY WITH MEALS (Patient taking differently: 15-20 Units 3 (three) times daily. Sliding scale) 15 mL 10   isosorbide mononitrate (IMDUR) 30 MG 24 hr tablet TAKE 1 TABLET BY MOUTH DAILY (Patient taking differently: Take 30 mg by mouth daily.) 30 tablet 10   Menthol, Topical Analgesic, (BLUE-EMU MAXIMUM STRENGTH EX) Apply 1 application. topically daily as needed (pain).     metoprolol succinate (TOPROL-XL) 50 MG 24 hr tablet Take 1 tablet by mouth once daily 90 tablet 1   metoprolol tartrate (LOPRESSOR) 50 MG tablet Take 50 mg by mouth as needed.     nitroGLYCERIN (NITROSTAT) 0.4 MG SL tablet DISSOLVE ONE TABLET UNDER THE TONGUE EVERY 5 MINUTES AS NEEDED FOR CHEST PAIN.  DO NOT EXCEED A TOTAL OF 3 DOSES IN 15 MINUTES Strength: 0.4 mg (Patient taking differently: Place 0.4 mg under the tongue every 5 (five) minutes x 3 doses as needed for chest pain.) 25 tablet 0   pantoprazole (PROTONIX) 40 MG tablet Take 1 tablet (40 mg total) by mouth 2 (two) times daily. 60 tablet 0   tirzepatide (MOUNJARO) 7.5 MG/0.5ML Pen Inject 7.5 mg into the skin once a week. (Patient taking differently: Inject 7.5 mg into the skin every Friday.) 6 mL 1   vitamin B-12 (CYANOCOBALAMIN) 1000 MCG tablet Take 1,000 mcg by mouth daily.     dapagliflozin propanediol (FARXIGA) 5 MG TABS tablet Take 1 tablet (5 mg total) by mouth daily before breakfast. (Patient not taking: Reported  on 03/09/2022) 90 tablet 2   No current facility-administered medications for this visit.    REVIEW OF SYSTEMS:   '[X]'$  denotes positive finding, '[ ]'$  denotes negative finding Cardiac  Comments:  Chest pain or chest pressure:    Shortness of breath upon exertion:    Short of breath when lying flat:    Irregular heart rhythm:        Vascular    Pain in calf, thigh, or hip brought on by ambulation:    Pain in feet at night that wakes you up from your sleep:     Blood clot in your veins:    Leg swelling:         Pulmonary    Oxygen at home:    Productive cough:     Wheezing:         Neurologic    Sudden weakness in arms or legs:     Sudden numbness in arms or legs:     Sudden onset of difficulty speaking or slurred speech:    Temporary loss of vision in one eye:     Problems with dizziness:         Gastrointestinal    Blood in stool:     Vomited blood:         Genitourinary    Burning when urinating:     Blood in urine:        Psychiatric    Major depression:         Hematologic    Bleeding problems:    Problems with blood clotting too easily:        Skin    Rashes or ulcers:        Constitutional    Fever or chills:      PHYSICAL EXAM:   Vitals:   03/09/22 1210  BP: 114/65  Pulse: 92  Resp: 20  Temp: 98.1 F (  36.7 C)  SpO2: 93%  Weight: 274 lb (124.3 kg)  Height: '6\' 2"'$  (1.88 m)    GENERAL: The patient is a well-nourished male, in no acute distress. The vital signs are documented above. CARDIAC: There is a regular rate and rhythm.  VASCULAR: Nonpalpable pedal pulses PULMONARY: Non-labored respirations MUSCULOSKELETAL: There are no major deformities or cyanosis. NEUROLOGIC: No focal weakness or paresthesias are detected. SKIN: See photos below:  PSYCHIATRIC: The patient has a normal affect.           STUDIES:   I have reviewed the following studies:  Right: Patent mid SFA stent without evidence of stenosis.   Left: Patent  femoropopliteal stent with mildly elevated velocities  involving the native outflow vessel.   MEDICAL ISSUES:   Significant improvement in bilateral lower extremity wounds.  Ultrasound today shows patent interventions.  He will continue wound care and follow-up in 3 months with repeat vascular studies    Annamarie Major, IV, MD, FACS [facility] [phone] Pager [phone]

## 2022-03-10 ENCOUNTER — Ambulatory Visit: Payer: Self-pay | Admitting: *Deleted

## 2022-03-10 NOTE — Patient Outreach (Incomplete)
Care Coordination   Follow Up Visit Note   03/12/2022 Name: [name] [name] MRN: [id number] DOB: October 20, 1950  [name] [name] is a 71 y.o. year old male who sees Victoria, Theador Hawthorne, FNP for primary care. I spoke with  [name] [name] by phone today.  What matters to the patients health and wellness today?  He reports he is sore everywhere from his foot to his neck after a fall on Wednesday 03/04/22  Did not hit his head but was getting off his electric chair to transfer to the commode,  stepped in water & landed on chair in the middle of his back  He reports hitting right between his shoulder blade to the left of his spine  He reports bruises form head to his buttocks Denies having difficulty breathing, dizziness, visual changes, Headaches, changes in  heart rate Using icy hot, tylenol for pain, no hydrocodone Long time since a previous fall He is in good spirits  Legs and feet hurt, the left greater than the right  He denies swelling Use to see Dr Irving Shows  Has a home health nurse visit weekly   [place] discontinued by Dr Theador Hawthorne  Had home PT previously but he reports continued weakness since March 2023 admission for sepsis Reports he if fearful of sepsis as this is how his father died  He reports he has loss weight   Diabetes Cbg range of 114-312    Goals Addressed               This Visit's Progress     Patient Stated     Fall [facility]) (pt-stated)   Not on track     Care Coordination Interventions: Provided written and verbal education re: potential causes of falls and Fall prevention strategies Advised patient of importance of notifying provider of falls Assessed for falls since last encounter Received permission to outreach to his pcp  Discussed bruises related to Eliquis  Encouraged shoes with grip or gripper socks  Confirms he actually has a life alert but he does not wear as he reports he has set it off more. Education on falls placed in his after visit summary (article &  video)       improved uncontrolled diabetes ([facility]) (pt-stated)   Not on track     Care Coordination Interventions: Provided education to patient about basic DM disease process Reviewed medications with patient and discussed importance of medication adherence Discussed plans with patient for ongoing care management follow up and provided patient with direct contact information for care management team Discussed the effects of uncontrolled diabetes on his neuropathy and recurrent sepsis       manage atrial fibrillation ([facility]) (pt-stated)   On track     Care Coordination Interventions: Reviewed importance of adherence to anticoagulant exactly as prescribed Afib action plan reviewed Screening for signs and symptoms of depression related to chronic disease state  Assessed social determinant of health barriers Provided RN CM office number. Encouraged outreach to RN CM, 24 hour nurse or pcp/cardiology office for any worsening symptoms\ Assessed for worsening symptoms-None         SDOH assessments and interventions completed:  No     Care Coordination Interventions Activated:  Yes  Care Coordination Interventions:  Yes, provided   Follow up plan: Follow up call scheduled for 03/31/22 100    Encounter Outcome:  Pt. Visit Completed   Diamonique Ruedas L. Lavina Hamman, RN, BSN, [place] Coordinator Office number [phone]

## 2022-03-11 ENCOUNTER — Telehealth: Payer: Self-pay | Admitting: Gastroenterology

## 2022-03-11 NOTE — Telephone Encounter (Signed)
Noted.   Routing to [place] as well to reach out to patient to schedule follow-up.

## 2022-03-11 NOTE — Progress Notes (Signed)
  Care Management   Follow Up Note   03/05/2022 Name: [name] [name] MRN: [id number] DOB: 1950-06-05   Referred by: [name], FNP Reason for referral : hyperlipidemia.   An unsuccessful telephone outreach was attempted today. The patient was referred to the case management team for assistance with care management and care coordination.   Follow Up Plan: Telephone follow up appointment with care management team member scheduled for: 3 weeks    Regina Eck, PharmD, BCPS Clinical Pharmacist, [place]  [facility] Phone [phone]

## 2022-03-11 NOTE — Telephone Encounter (Signed)
Spoke to pt and his wife, informed that he needs an OV before procedures could be scheduled. Voiced understanding. Transferred to front desk to make [place].

## 2022-03-11 NOTE — Telephone Encounter (Signed)
Spoke to pt and his wife, they informed me that the pt is in need of a colonoscopy. He states he has been in the hospital a couple of times and it was suggest that he have a colonoscopy and upper endoscopy. Please advise.

## 2022-03-11 NOTE — Telephone Encounter (Signed)
I last saw patient in June and recommended colonoscopy and possible EGD. Looks like [facility] tried to reach him to schedule but couldn't get a hold of him. As it has been 4 months since he was last seen, and he has had a couple of hospitalizations, he will need to be seen in the office again prior to scheduling procedures.

## 2022-03-11 NOTE — Telephone Encounter (Signed)
Patient's wife left a message saying they had called previously and was supposed to receive some paperwork but so far have received nothing.    I don't see where anything was sent and really unsure what she is referring to.

## 2022-03-12 ENCOUNTER — Telehealth: Payer: Self-pay | Admitting: *Deleted

## 2022-03-12 ENCOUNTER — Ambulatory Visit: Payer: Self-pay | Admitting: *Deleted

## 2022-03-12 DIAGNOSIS — I70244 Atherosclerosis of native arteries of left leg with ulceration of heel and midfoot: Secondary | ICD-10-CM | POA: Diagnosis not present

## 2022-03-12 DIAGNOSIS — L97522 Non-pressure chronic ulcer of other part of left foot with fat layer exposed: Secondary | ICD-10-CM | POA: Diagnosis not present

## 2022-03-12 DIAGNOSIS — L97821 Non-pressure chronic ulcer of other part of left lower leg limited to breakdown of skin: Secondary | ICD-10-CM | POA: Diagnosis not present

## 2022-03-12 DIAGNOSIS — Z7901 Long term (current) use of anticoagulants: Secondary | ICD-10-CM | POA: Diagnosis not present

## 2022-03-12 DIAGNOSIS — I739 Peripheral vascular disease, unspecified: Secondary | ICD-10-CM | POA: Diagnosis not present

## 2022-03-12 DIAGNOSIS — I50813 Acute on chronic right heart failure: Secondary | ICD-10-CM | POA: Diagnosis not present

## 2022-03-12 DIAGNOSIS — I2510 Atherosclerotic heart disease of native coronary artery without angina pectoris: Secondary | ICD-10-CM | POA: Diagnosis not present

## 2022-03-12 DIAGNOSIS — E1140 Type 2 diabetes mellitus with diabetic neuropathy, unspecified: Secondary | ICD-10-CM | POA: Diagnosis not present

## 2022-03-12 DIAGNOSIS — I480 Paroxysmal atrial fibrillation: Secondary | ICD-10-CM | POA: Diagnosis not present

## 2022-03-12 DIAGNOSIS — I87312 Chronic venous hypertension (idiopathic) with ulcer of left lower extremity: Secondary | ICD-10-CM | POA: Diagnosis not present

## 2022-03-12 NOTE — Patient Instructions (Signed)
Visit Information  Thank you for taking time to visit with me today. Please don't hesitate to contact me if I can be of assistance to you.   Following are the goals we discussed today:   Goals Addressed               This Visit's Progress     Patient Stated     Fall [facility]) (pt-stated)   On track     Care Coordination Interventions: Provided written and verbal education re: potential causes of falls and Fall prevention strategies Advised patient of importance of notifying provider of falls Assessed for falls since last encounter Updated his wife on the outreach to his pcp  Confirmed with his wife that he was improving Reviewed the scheduled 03/16/22 0845 pcp visit         Our next appointment is by telephone on 03/31/22 at 1000  Please call the care guide team at [phone] if you need to cancel or reschedule your appointment.   If you are experiencing a Mental Health or [place] or need someone to talk to, please call the Suicide and Crisis Lifeline: 988 call the [place] National Suicide Prevention Lifeline: 340-674-0733 or TTY: 587-356-0021 TTY 3511760555) to talk to a trained counselor call 1-800-273-TALK (toll free, 24 hour hotline) call the [facility]: 915-685-3571 call 911   Patient verbalizes understanding of instructions and care plan provided today and agrees to view in [place]. Active MyChart status and patient understanding of how to access instructions and care plan via MyChart confirmed with patient.     The patient has been provided with contact information for the care management team and has been advised to call with any health related questions or concerns.   Pilar Corrales L. Lavina Hamman, RN, BSN, [place] Coordinator Office number [phone]

## 2022-03-12 NOTE — Telephone Encounter (Signed)
VM from [place] w/ [facility] Pt's BS today was 239, his weight is below the parameters Do you want to change the parameters Please advise

## 2022-03-12 NOTE — Telephone Encounter (Signed)
TC back from [place], pt's BS was up at 239, he is still not doing daily wgts & BS logs Pt told her at his most recent visit this week that he was 68 and he is slowly losing weight. Will change weight parameters to a 10 lb parameter based on what their agency has.

## 2022-03-12 NOTE — Patient Instructions (Addendum)
Visit Information  Thank you for taking time to visit with me today. Please don't hesitate to contact me if I can be of assistance to you.   Following are the goals we discussed today:   Goals Addressed               This Visit's Progress     Patient Stated     Fall [facility]) (pt-stated)   Not on track     Care Coordination Interventions: Provided written and verbal education re: potential causes of falls and Fall prevention strategies Advised patient of importance of notifying provider of falls Assessed for falls since last encounter Received permission to outreach to his pcp  Discussed bruises related to Eliquis  Encouraged shoes with grip or gripper socks  Confirms he actually has a life alert but he does not wear as he reports he has set it off more. Education on falls placed in his after visit summary (article & video)       improved uncontrolled diabetes ([facility]) (pt-stated)   Not on track     Care Coordination Interventions: Provided education to patient about basic DM disease process Reviewed medications with patient and discussed importance of medication adherence Discussed plans with patient for ongoing care management follow up and provided patient with direct contact information for care management team Discussed the effects of uncontrolled diabetes on his neuropathy and recurrent sepsis       manage atrial fibrillation ([facility]) (pt-stated)   On track     Care Coordination Interventions: Reviewed importance of adherence to anticoagulant exactly as prescribed Afib action plan reviewed Screening for signs and symptoms of depression related to chronic disease state  Assessed social determinant of health barriers Provided RN CM office number. Encouraged outreach to RN CM, 24 hour nurse or pcp/cardiology office for any worsening symptoms\ Assessed for worsening symptoms-None         Our next appointment is by telephone on 03/31/22 at 1000  Please call the care guide team  at [phone] if you need to cancel or reschedule your appointment.   If you are experiencing a Mental Health or [place] or need someone to talk to, please call the Suicide and Crisis Lifeline: 988 call the [place] National Suicide Prevention Lifeline: (506)376-9006 or TTY: (204) 197-1754 TTY 732-014-3480) to talk to a trained counselor call 1-800-273-TALK (toll free, 24 hour hotline) call the [facility]: 276-609-3613 call 911   Patient verbalizes understanding of instructions and care plan provided today and agrees to view in [name] [name]. Active MyChart status and patient understanding of how to access instructions and care plan via MyChart confirmed with patient.     The patient has been provided with contact information for the care management team and has been advised to call with any health related questions or concerns.   [name] [name] L. Lavina Hamman, RN, BSN, [place] Coordinator Office number [phone]

## 2022-03-12 NOTE — Patient Outreach (Signed)
  Care Coordination   Follow Up Visit Note   03/12/2022 Name: CAIDON FOTI MRN: [id number] DOB: 03/09/1951  [name] is a 71 y.o. year old male who sees [place], Theador Hawthorne, FNP for primary care. I spoke with [name] , wife of   Bing A Wiseman by phone today. He was on another phone with his cousin  What matters to the patients health and wellness today?  Follow up after his recent fall. Discussed the outreach to his pcp and her encouragement to come to the office earlier if needed [name] reports Mr Badley is feeling better after his fall    Goals Addressed               This Visit's Progress     Patient Stated     Fall [facility]) (pt-stated)   On track     Care Coordination Interventions: Provided written and verbal education re: potential causes of falls and Fall prevention strategies Advised patient of importance of notifying provider of falls Assessed for falls since last encounter Updated his wife on the outreach to his pcp  Confirmed with his wife that he was improving Reviewed the scheduled 03/16/22 0845 pcp visit         SDOH assessments and interventions completed:  No     Care Coordination Interventions Activated:  Yes  Care Coordination Interventions:  Yes, provided   Follow up plan: Follow up call scheduled for 03/31/22 1000    Encounter Outcome:  Pt. Visit Completed   Haiden Clucas L. Lavina Hamman, RN, BSN, [place] Coordinator Office number [phone]

## 2022-03-13 ENCOUNTER — Other Ambulatory Visit: Payer: Self-pay

## 2022-03-13 DIAGNOSIS — I739 Peripheral vascular disease, unspecified: Secondary | ICD-10-CM

## 2022-03-13 DIAGNOSIS — I70213 Atherosclerosis of native arteries of extremities with intermittent claudication, bilateral legs: Secondary | ICD-10-CM

## 2022-03-13 DIAGNOSIS — I7025 Atherosclerosis of native arteries of other extremities with ulceration: Secondary | ICD-10-CM

## 2022-03-13 NOTE — Telephone Encounter (Signed)
Ok

## 2022-03-16 ENCOUNTER — Ambulatory Visit (INDEPENDENT_AMBULATORY_CARE_PROVIDER_SITE_OTHER): Payer: Medicare Other | Admitting: Family

## 2022-03-16 ENCOUNTER — Encounter: Payer: Self-pay | Admitting: Family

## 2022-03-16 VITALS — BP 106/66 | HR 96 | Temp 97.0°F | Ht 74.0 in

## 2022-03-16 DIAGNOSIS — F419 Anxiety disorder, unspecified: Secondary | ICD-10-CM

## 2022-03-16 DIAGNOSIS — Z9861 Coronary angioplasty status: Secondary | ICD-10-CM | POA: Diagnosis not present

## 2022-03-16 DIAGNOSIS — E1142 Type 2 diabetes mellitus with diabetic polyneuropathy: Secondary | ICD-10-CM

## 2022-03-16 DIAGNOSIS — K219 Gastro-esophageal reflux disease without esophagitis: Secondary | ICD-10-CM

## 2022-03-16 DIAGNOSIS — E1169 Type 2 diabetes mellitus with other specified complication: Secondary | ICD-10-CM

## 2022-03-16 DIAGNOSIS — I2510 Atherosclerotic heart disease of native coronary artery without angina pectoris: Secondary | ICD-10-CM | POA: Diagnosis not present

## 2022-03-16 DIAGNOSIS — I5020 Unspecified systolic (congestive) heart failure: Secondary | ICD-10-CM

## 2022-03-16 DIAGNOSIS — I152 Hypertension secondary to endocrine disorders: Secondary | ICD-10-CM

## 2022-03-16 DIAGNOSIS — G720 Drug-induced myopathy: Secondary | ICD-10-CM

## 2022-03-16 DIAGNOSIS — F331 Major depressive disorder, recurrent, moderate: Secondary | ICD-10-CM

## 2022-03-16 DIAGNOSIS — E785 Hyperlipidemia, unspecified: Secondary | ICD-10-CM

## 2022-03-16 DIAGNOSIS — I25118 Atherosclerotic heart disease of native coronary artery with other forms of angina pectoris: Secondary | ICD-10-CM

## 2022-03-16 DIAGNOSIS — I779 Disorder of arteries and arterioles, unspecified: Secondary | ICD-10-CM | POA: Diagnosis not present

## 2022-03-16 DIAGNOSIS — I70209 Unspecified atherosclerosis of native arteries of extremities, unspecified extremity: Secondary | ICD-10-CM

## 2022-03-16 DIAGNOSIS — E1159 Type 2 diabetes mellitus with other circulatory complications: Secondary | ICD-10-CM | POA: Diagnosis not present

## 2022-03-16 DIAGNOSIS — E782 Mixed hyperlipidemia: Secondary | ICD-10-CM | POA: Diagnosis not present

## 2022-03-16 LAB — BAYER DCA HB A1C WAIVED: HB A1C (BAYER DCA - WAIVED): 8.9 % — ABNORMAL HIGH (ref 4.8–5.6)

## 2022-03-16 MED ORDER — TIRZEPATIDE 10 MG/0.5ML ~~LOC~~ SOAJ: 10.0000 mg | SUBCUTANEOUS | 2 refills | Status: DC

## 2022-03-16 NOTE — Progress Notes (Unsigned)
Referring Provider: [name], FNP Primary Care Physician:  [name], FNP Primary GI Physician: Dr. [name]  No chief complaint on file.   HPI:   [name] [name] is a 71 y.o. male presenting today to discuss scheduling EGD and colonoscopy due to [place]. Also with ***   He was last seen in our office 10/16/21 at request of Dr. Theador Hawthorne for IDA. Per chart review noted mild intermittent anemia with hemoglobin dipping down to the 11-12 range since January 2022 with intermittent microcytic indices. Frequently this corresponded with vascular surgeries.  In January, he was found to have ferritin 24 (L), iron saturation 7% (L), iron 31 (L).  He received 2 Feraheme infusions in February per Dr. Theador Hawthorne and was referred to GI for further evaluation.  He had no significant GI symptoms or overt GI bleeding. Denies NSAIDs. Noted he was overdue for surveillance colonoscopy. Overall, suspected primary driver behind his intermittent anemia and iron deficiency is likely secondary to blood loss in the setting of frequent vascular surgeries, but unable to rule out GI source. Recommended updating colonoscopy and possible EGD if colonoscopy unrevealing per Dr. Roseanne Kaufman discretion.   We received approval to hold Eliquis x 1 day from cardiology. Planned to continue Plavix. Unfortunately we were unable to reach patient to schedule procedures and he was mailed a letter in June. Patient's wife called on 10/25 stating patient had been hospitalized a couple times and was advised he sohuld have a colonoscopy and upper endoscopy. Recommended OV to regroup.   Per chart review, he was hospitalized in August with sepsis with encephalopathy in the setting of recurrent Group B strep bacteremia.  Hospitalized in September 2023 SIRS of unclear etiology, presented  at advise of ID for subjective fever, chills, abdominal pain, nausea, vomiting and shortness of breath. He qualified for diagnosis of SIRS though unclear source. Marland Kitchen   History of recurrent GBS bacteremia, but cultures were negative during this admission. He also had  mild AKI and LFT elevations, WBC elevated. Troponin elevated,  ECG revealed AFib with RBBB, nonspecific ST-T changes. CXR showed cardiomegaly with central vascular congestion. He was hypoxic. CT A/P raised concern for gallbladder stone/sludge, questionable pericholecystic fat stranding, hepatic steatosis, small volume ascites, trace right pleural effusion.  RUQ ultrasound with cholelithiasis, mild gallbladder wall thickening without acute cholecystitis, liver appeared normal.  HIDA negative.  He was treated with empiric broad-spectrum antibiotics which were narrowed after discussions were had with ID.  White blood cell count normalized.  Troponin elevations were suspected to be secondary to demand ischemia.  Cardiology recommended continuing home medications with outpatient follow-up.  GI saw patient during admission.  Suspected LFT elevations were secondary to recent strong antibiotics and possible ischemic event given elevated troponins.  Acute hepatitis panel negative.  LFTs were trending down by discharge (AST 152, ALT 445). Regarding his nausea and upper abdominal pain, suspect this is multifactorial in setting of antibiotics, GERD, possible gallbladder etiology.  Recommended PPI twice daily and Zofran scheduled. He also reported dysphagia. Recommended outpatient colonoscopy and EGD with primary GI team for IDA and dysphagia.   He has had follow-up with cardiology who recommended continuing medical management of his chronic symptoms.  No further evaluation planned aside from considering repeat TEE in several months for monitoring of valvular heart disease.   Most recent labs on file 02/19/2022: LFTs normalized.  Hemoglobin 10.4 with microcytic indices.  Today:      ?IV iron:  PO iron:   Last  colonoscopy in May 2016 revealing multiple tubular adenomas, 1 hyperplastic polyp removed with recommendations  for repeat in 3 years.  His last EGD was also in May 2016 with mild chronic inactive gastritis, negative for H. pylori.   Last EGD May 2016:  Patchy mottling/erythema and minimal polypoid appearance of gastric mucosa. bx with mild inlammation but no H.pylori  BPE 12/22/21: No esophageal mass or stricture, barium tablet pass without difficulty, esophageal dysmotility is present with retention of contrast.  Past Medical History:  Diagnosis Date   Anxiety    Arthritis    Atrial fibrillation (HCC)    CAD (coronary artery disease)    a. 2010: DES to CTO of RCA. EF 55% b. 07/2016: cath showing total occlusion within previously placed RCA stent (collaterals present), severe stenosis along LCx and OM1 (treated with 2 overlapping DES). c. repeat cath in 01/2018 showing patent stents along LCx and OM with CTO of D2, CTO of distal LCx, and CTO of RCA with collaterals present overall unchanged since 2018 with medical management recom   Cellulitis and abscess rt groin    Complication of anesthesia    " I woke up during a colonoscopy "      Depression    Diabetes mellitus    Diastolic CHF ([place])    Disorders of iron metabolism    Dysrhythmia    Fibromyalgia    GERD (gastroesophageal reflux disease)    History of hiatal hernia    Hyperlipidemia    Hypertension    Low serum testosterone level    Medically noncompliant    Myocardial infarction ([place])    05-23-20   Pneumonia     Past Surgical History:  Procedure Laterality Date   ABDOMINAL AORTOGRAM W/LOWER EXTREMITY N/A 03/21/2019   Procedure: ABDOMINAL AORTOGRAM W/LOWER EXTREMITY;  Surgeon: [name], MD;  Location: [facility];  Service: Cardiovascular;  Laterality: N/A;   ABDOMINAL AORTOGRAM W/LOWER EXTREMITY N/A 11/12/2020   Procedure: ABDOMINAL AORTOGRAM W/LOWER EXTREMITY;  Surgeon: [name], MD;  Location: [facility];  Service: Cardiovascular;  Laterality: N/A;   ABDOMINAL AORTOGRAM W/LOWER EXTREMITY N/A 01/28/2021    Procedure: ABDOMINAL AORTOGRAM W/LOWER EXTREMITY;  Surgeon: [name], MD;  Location: [facility];  Service: Cardiovascular;  Laterality: N/A;   ABDOMINAL AORTOGRAM W/LOWER EXTREMITY Bilateral 07/29/2021   Procedure: ABDOMINAL AORTOGRAM W/LOWER EXTREMITY;  Surgeon: [name], MD;  Location: [facility];  Service: Cardiovascular;  Laterality: Bilateral;   ANGIOPLASTY N/A 08/15/2021   Procedure: ATTEMPTED RIGHT PERONEAL  ANGIOPLASTY, LEFT PERONEAL ANGIOPLASTY;  Surgeon: [name], MD;  Location: [facility];  Service: Vascular;  Laterality: N/A;   BACK SURGERY  2015   ACDF by Dr. Orland Jarred STUDY  12/23/2021   Procedure: BUBBLE STUDY;  Surgeon: Elouise Munroe, MD;  Location: [facility];  Service: Cardiology;;   COLONOSCOPY N/A 10/01/2014   Dr. [name]: multiple tubular adenomas removed, colonic diverticulosis, redundant colon. next tcs advised for 09/2017. PATIENT NEEDS PROPOFOL FOR FAILED CONSCIOUS SEDATION   CORONARY STENT INTERVENTION N/A 07/30/2016   Procedure: Coronary Stent Intervention;  Surgeon: [name], MD;  Location: [facility];  Service: Cardiovascular;  Laterality: N/A;   CORONARY STENT PLACEMENT  2000   By Dr. Olevia Perches   EP IMPLANTABLE DEVICE N/A 05/25/2016   Procedure: Loop Recorder Insertion;  Surgeon: [name], MD;  Location: [facility];  Service: Cardiovascular;  Laterality: N/A;   ESOPHAGOGASTRODUODENOSCOPY  esophagus stretched remotely at [facility]   ESOPHAGOGASTRODUODENOSCOPY N/A 10/01/2014   Dr. [name]: patchy mottling/erythema and minimal polypoid appearance of gastric mucosa. bx with mild inlammation but no H.pylori   FEMORAL-POPLITEAL BYPASS GRAFT Left 11/22/2020   Procedure: LEFT FEMORAL-POPLITEAL BYPASS GRAFT;  Surgeon: [name], MD;  [facility];  Service: Vascular;  Laterality: Left;   FEMORAL-POPLITEAL BYPASS GRAFT Left 08/15/2021   Procedure: REDO LEFT FEMORAL-POPLITEAL BYPASS USING PROPATEN GRAFT;   Surgeon: [name], MD;  Location: [facility];  Service: Vascular;  Laterality: Left;   HERNIA REPAIR  3299   umbilical   INSERTION OF ILIAC STENT Right 11/22/2020   Procedure: INSERTION OF ELUVIA STENT INTO RIGHT DISTAL SUPERFICIAL FEMORAL ARTERY;  Surgeon: [name], MD;  Location: [facility];  Service: Vascular;  Laterality: Right;   LEFT HEART CATH AND CORONARY ANGIOGRAPHY N/A 07/30/2016   Procedure: Left Heart Cath and Coronary Angiography;  Surgeon: [name], MD;  Location: [facility];  Service: Cardiovascular;  Laterality: N/A;   LEFT HEART CATH AND CORONARY ANGIOGRAPHY N/A 01/19/2018   Procedure: LEFT HEART CATH AND CORONARY ANGIOGRAPHY;  Surgeon: [name], MD;  Location: [facility];  Service: Cardiovascular;  Laterality: N/A;   LEFT HEART CATH AND CORONARY ANGIOGRAPHY N/A 05/24/2020   Procedure: LEFT HEART CATH AND CORONARY ANGIOGRAPHY;  Surgeon: Burnell Blanks, MD;  Location: [facility];  Service: Cardiovascular;  Laterality: N/A;   LEFT HEART CATH AND CORONARY ANGIOGRAPHY N/A 08/06/2021   Procedure: LEFT HEART CATH AND CORONARY ANGIOGRAPHY;  Surgeon: Burnell Blanks, MD;  Location: [place] [facility];  Service: Cardiovascular;  Laterality: N/A;   LESION REMOVAL     Lip and hand    LOWER EXTREMITY ANGIOGRAM Right 11/22/2020   Procedure: RIGHT LEG ANGIOGRAM;  Surgeon: [name], MD;  [facility];  Service: Vascular;  Laterality: Right;   LOWER EXTREMITY ANGIOGRAM Right 08/15/2021   Procedure: RIGHT LOWER EXTREMITY ANGIOGRAM;  Surgeon: [name], MD;  [facility];  Service: Vascular;  Laterality: Right;   LOWER EXTREMITY ANGIOGRAPHY N/A 04/18/2019   Procedure: LOWER EXTREMITY ANGIOGRAPHY;  Surgeon: [name], MD;  Location: [facility];  Service: Cardiovascular;  Laterality: N/A;   LOWER EXTREMITY ANGIOGRAPHY N/A 08/18/2021   Procedure: Lower Extremity Angiography;  Surgeon: Waynetta Sandy, MD;   Location: [facility];  Service: Cardiovascular;  Laterality: N/A;   NECK SURGERY     PERIPHERAL VASCULAR BALLOON ANGIOPLASTY  04/18/2019   Procedure: PERIPHERAL VASCULAR BALLOON ANGIOPLASTY;  Surgeon: [name], MD;  Location: [facility];  Service: Cardiovascular;;   PERIPHERAL VASCULAR BALLOON ANGIOPLASTY Left 11/12/2020   Procedure: PERIPHERAL VASCULAR BALLOON ANGIOPLASTY;  Surgeon: [name], MD;  Location: [facility];  Service: Cardiovascular;  Laterality: Left;  Failed PTA of superficial femoral artery.   PERIPHERAL VASCULAR BALLOON ANGIOPLASTY Right 08/18/2021   Procedure: PERIPHERAL VASCULAR BALLOON ANGIOPLASTY;  Surgeon: Waynetta Sandy, MD;  Location: [facility];  Service: Cardiovascular;  Laterality: Right;  peroneal   PERIPHERAL VASCULAR INTERVENTION Right 03/21/2019   Procedure: PERIPHERAL VASCULAR INTERVENTION;  Surgeon: [name], MD;  Location: [facility];  Service: Cardiovascular;  Laterality: Right;  superficial femoral   PERIPHERAL VASCULAR INTERVENTION Right 08/18/2021   Procedure: PERIPHERAL VASCULAR INTERVENTION;  Surgeon: Waynetta Sandy, MD;  Location: [facility];  Service: Cardiovascular;  Laterality: Right;  tibial peroneal  trunk   TEE WITHOUT CARDIOVERSION N/A 12/23/2021   Procedure: TRANSESOPHAGEAL ECHOCARDIOGRAM (TEE);  Surgeon: Elouise Munroe, MD;  Location: [facility];  Service: Cardiology;  Laterality: N/A;    Current Outpatient Medications  Medication Sig Dispense Refill   albuterol (VENTOLIN HFA) 108 (90 Base) MCG/ACT inhaler Inhale 2 puffs into the lungs every 6 (six) hours as needed for wheezing or shortness of breath. 8.5 g 0   amoxicillin-clavulanate (AUGMENTIN) 875-125 MG tablet Take 1 tablet by mouth every 12 (twelve) hours. if fever >100.50F (Patient not taking: Reported on 03/16/2022) 10 tablet 0   busPIRone (BUSPAR) 7.5 MG tablet Take 1 tablet (7.5 mg total) by mouth 3  (three) times daily as needed. 90 tablet 2   Calcium Carbonate Antacid (ALKA-SELTZER ANTACID PO) Take 1 tablet by mouth 2 (two) times daily as needed (heartburn, indigestion).     clopidogrel (PLAVIX) 75 MG tablet TAKE 1 TABLET BY MOUTH DAILY (Patient taking differently: Take 75 mg by mouth daily.) 30 tablet 10   DULoxetine (CYMBALTA) 30 MG capsule Take 3 capsules (90 mg total) by mouth daily. (Patient taking differently: Take 90 mg by mouth at bedtime.) 120 capsule 1   ELIQUIS 5 MG TABS tablet TAKE ONE (1) TABLET BY MOUTH TWICE DAILY (Patient taking differently: Take 5 mg by mouth 2 (two) times daily.) 60 tablet 2   ezetimibe (ZETIA) 10 MG tablet Take 1 tablet (10 mg total) by mouth daily. 90 tablet 3   fenofibrate 160 MG tablet TAKE 1 TABLET BY MOUTH DAILY 30 tablet 0   furosemide (LASIX) 20 MG tablet Take 20 mg by mouth as needed.     gabapentin (NEURONTIN) 300 MG capsule TAKE 1 CAPSULE BY MOUTH 3 TIMES DAILY 90 capsule 10   HUMULIN 70/30 (70-30) 100 UNIT/ML injection INJECT 60 UNITS SUBCUTANEOUSLY TWICE DAILY WITH MEALS 30 mL 0   HYDROcodone-acetaminophen (NORCO) 10-325 MG tablet Take 1 tablet by mouth every 4 (four) hours as needed for moderate pain. (Patient taking differently: Take 1 tablet by mouth 2 (two) times daily as needed (pain).) 30 tablet 0   insulin degludec (TRESIBA FLEXTOUCH) 200 UNIT/ML FlexTouch Pen Inject 16 Units into the skin at bedtime. 9 mL 2   insulin lispro (HUMALOG) 100 UNIT/ML KwikPen INJECT 15-20 UNITS SUBCUTANEOUSLY THREE TIMES A DAY WITH MEALS (Patient taking differently: 15-20 Units 3 (three) times daily. Sliding scale) 15 mL 10   isosorbide mononitrate (IMDUR) 30 MG 24 hr tablet TAKE 1 TABLET BY MOUTH DAILY (Patient taking differently: Take 30 mg by mouth daily.) 30 tablet 10   Menthol, Topical Analgesic, (BLUE-EMU MAXIMUM STRENGTH EX) Apply 1 application. topically daily as needed (pain).     metoprolol succinate (TOPROL-XL) 50 MG 24 hr tablet Take 1 tablet by mouth  once daily 90 tablet 1   metoprolol tartrate (LOPRESSOR) 50 MG tablet Take 50 mg by mouth as needed.     nitroGLYCERIN (NITROSTAT) 0.4 MG SL tablet DISSOLVE ONE TABLET UNDER THE TONGUE EVERY 5 MINUTES AS NEEDED FOR CHEST PAIN.  DO NOT EXCEED A TOTAL OF 3 DOSES IN 15 MINUTES Strength: 0.4 mg (Patient taking differently: Place 0.4 mg under the tongue every 5 (five) minutes x 3 doses as needed for chest pain.) 25 tablet 0   pantoprazole (PROTONIX) 40 MG tablet Take 1 tablet (40 mg total) by mouth 2 (two) times daily. 60 tablet 0   tirzepatide (MOUNJARO) 10 MG/0.5ML Pen Inject 10 mg into the skin once a week. 6 mL 2  TRUEPLUS INSULIN SYRINGE 28G X 1/2" 1 ML MISC USE TO GIVE INSULIN FIVE TIMES DAILY     vitamin B-12 (CYANOCOBALAMIN) 1000 MCG tablet Take 1,000 mcg by mouth daily.     No current facility-administered medications for this visit.    Allergies as of 03/17/2022 - Review Complete 03/16/2022  Allergen Reaction Noted   Shellfish allergy Anaphylaxis, Hives, and Swelling 09/06/2014   Sulfa antibiotics Anaphylaxis, Hives, and Swelling 09/06/2014   Ace inhibitors Other (See Comments) and Cough 08/09/2013   Invokana [canagliflozin] Other (See Comments) 09/04/2013   Lexapro [escitalopram] Other (See Comments) 01/13/2016   Metformin and related Itching 07/12/2013   Pravachol [pravastatin sodium] Other (See Comments) 11/14/2014   Repatha [evolocumab] Other (See Comments) 01/04/2017   Tricor [fenofibrate] Other (See Comments) 11/14/2015   Zestril [lisinopril] Cough 10/04/2014   Crestor [rosuvastatin] Other (See Comments) 06/05/2013   Horse-derived products Rash 07/25/2008   Lipitor [atorvastatin] Other (See Comments) 06/05/2013   Livalo [pitavastatin] Other (See Comments) 06/25/2016   Milk (cow) Diarrhea and Nausea Only 04/06/2019   Tape Rash 07/25/2008    Family History  Problem Relation Age of Onset   Diabetes Father    Valvular heart disease Father    Arthritis Father    Heart  disease Father    Stroke Father    Alzheimer's disease Mother    Hyperlipidemia Mother    Hypertension Mother    Arthritis Mother    Lung cancer Mother    Stroke Mother    Headache Mother    Arthritis/Rheumatoid Sister    Diabetes Sister    Hypertension Sister    Hyperlipidemia Sister    Depression Sister    Dementia Maternal Aunt    Dementia Maternal Uncle    Heart disease Maternal Uncle    Stomach cancer Paternal Uncle    Colon cancer Neg Hx    Liver disease Neg Hx     Social History   Socioeconomic History   Marital status: Married    Spouse name: Belenda Cruise   Number of children: 2   Years of education: 12   Highest education level: 12th grade  Occupational History   Occupation: retired  Tobacco Use   Smoking status: Former    Packs/day: 0.25    Years: 51.00    Total pack years: 12.75    Types: Cigarettes    Quit date: 08/14/2016    Years since quitting: 5.5   Smokeless tobacco: Never   Tobacco comments:    smokes  a pack a week  Vaping Use   Vaping Use: Never used  Substance and Sexual Activity   Alcohol use: No    Alcohol/week: 0.0 standard drinks of alcohol   Drug use: No   Sexual activity: Yes  Other Topics Concern   Not on file  Social History Narrative   Lives with his wife.  Retired.  Unable to afford expensive medicines.     He has 2 children from previous marriage - they live in [place]       Caffeine: 1 cup of 1/2 caff coffee in AM, drinks more if at a restaurant    Social Determinants of Health   Financial Resource Strain: Medium Risk (01/06/2021)   Overall Financial Resource Strain (CARDIA)    Difficulty of Paying Living Expenses: Somewhat hard  Food Insecurity: No Food Insecurity (02/16/2022)   Hunger Vital Sign    Worried About Running Out of Food in the Last Year: Never true    Ran Out of  Food in the Last Year: Never true  Transportation Needs: No Transportation Needs (02/16/2022)   PRAPARE - Hydrologist  (Medical): No    Lack of Transportation (Non-Medical): No  Physical Activity: Insufficiently Active (01/06/2021)   Exercise Vital Sign    Days of Exercise per Week: 7 days    Minutes of Exercise per Session: 10 min  Stress: No Stress Concern Present (02/16/2022)   [place]    Feeling of Stress : Only a little  Social Connections: Moderately Isolated (01/06/2021)   Social Connection and Isolation Panel [NHANES]    Frequency of Communication with Friends and Family: More than three times a week    Frequency of Social Gatherings with Friends and Family: Twice a week    Attends Religious Services: Never    Marine scientist or Organizations: No    Attends Music therapist: Never    Marital Status: Married    Review of Systems: Gen: Denies fever, chills, cold or flu like symptoms, pre-syncope, or syncope.  CV: Denies chest pain, palpitations. Resp: Denies dyspnea, cough.  GI: See HPI Heme: See HPI  Physical Exam: There were no vitals taken for this visit. General:   Alert and oriented. No distress noted. Pleasant and cooperative.  Head:  Normocephalic and atraumatic. Eyes:  Conjuctiva clear without scleral icterus. Heart:  S1, S2 present without murmurs appreciated. Lungs:  Clear to auscultation bilaterally. No wheezes, rales, or rhonchi. No distress.  Abdomen:  +BS, soft, non-tender and non-distended. No rebound or guarding. No HSM or masses noted. Msk:  Symmetrical without gross deformities. Normal posture. Extremities:  Without edema. Neurologic:  Alert and  oriented x4 Psych:  Normal mood and affect.    Assessment:     Plan:  ***   Aliene Altes, PA-C [facility] 03/17/2022

## 2022-03-16 NOTE — Patient Instructions (Signed)

## 2022-03-16 NOTE — Progress Notes (Signed)
Subjective:    Patient ID: [name] [name], male    DOB: Apr 10, 1951, 71 y.o.   MRN: [id number]  Chief Complaint  Patient presents with   Medical Management of Chronic Issues   PT presents to the office today for chronic follow up. PT had NSTEMI on 07/30/16 and 05/23/20 and stent placed. Pt is followed by Cardiologists every 4 months for CAD and decreased EF. He had a cath 08/08/21 that was showed no blockage.   He has been hospitalized twice for sepsis. He is followed by ID.    Pt currently in a clinical trial for hyperlipidemia. He can not tolerate statins related to muscle pain.  He is followed by Ortho for osteoarthritis and is currently getting injections in bilateral knees and back. Pt is followed by Pain Management every 2-3 months. Followed by Nephrologists every 3 months for CKD.    He is followed by Vascular and had stent placed in his right leg and had a left femoral-popliteal  bypass on 11/22/20. He had a abdominal aortogram with lower extremity on 01/28/21.   He is followed by Vascular surgery and has a bypass 08/15/21. He has several vascular wounds on bilateral feet. Has Home Health once  a week for wound care. Improving.    He is morbid obese with a BMI of 35 and diabetic and HTN.  Hypertension This is a chronic problem. The current episode started more than 1 year ago. The problem has been resolved since onset. Associated symptoms include anxiety, blurred vision and malaise/fatigue. Pertinent negatives include no peripheral edema or shortness of breath. Risk factors for coronary artery disease include dyslipidemia, obesity and male gender. The current treatment provides moderate improvement. Hypertensive end-organ damage includes heart failure.  Congestive Heart Failure Presents for follow-up visit. Associated symptoms include edema and fatigue. Pertinent negatives include no shortness of breath. The symptoms have been stable.  Gastroesophageal Reflux He complains of belching  and heartburn. He reports no coughing. This is a chronic problem. The current episode started more than 1 year ago. The problem occurs occasionally. Associated symptoms include fatigue. Risk factors include obesity. He has tried a PPI for the symptoms. The treatment provided moderate relief.  Hyperlipidemia This is a chronic problem. The current episode started more than 1 year ago. Exacerbating diseases include obesity. Pertinent negatives include no shortness of breath. The current treatment provides moderate improvement of lipids. Risk factors for coronary artery disease include diabetes mellitus, dyslipidemia, hypertension and a sedentary lifestyle.  Diabetes He presents for his follow-up diabetic visit. He has type 2 diabetes mellitus. Hypoglycemia symptoms include nervousness/anxiousness. Associated symptoms include blurred vision, fatigue and foot paresthesias. Symptoms are stable. Diabetic complications include heart disease, nephropathy and peripheral neuropathy. Risk factors for coronary artery disease include dyslipidemia, diabetes mellitus, male sex, hypertension and sedentary lifestyle. He is following a generally healthy diet. His overall blood glucose range is >200 mg/dl. Eye exam is not current.  Depression        This is a chronic problem.  The current episode started more than 1 year ago.   The onset quality is gradual.   The problem occurs intermittently.  Associated symptoms include fatigue, decreased interest and sad.  Associated symptoms include no helplessness and no hopelessness.  Past medical history includes anxiety.   Anxiety Presents for follow-up visit. Symptoms include depressed mood, excessive worry, irritability and nervous/anxious behavior. Patient reports no shortness of breath. Symptoms occur occasionally. The severity of symptoms is moderate.  Review of Systems  Constitutional:  Positive for fatigue, irritability and malaise/fatigue.  Eyes:  Positive for  blurred vision.  Respiratory:  Negative for cough and shortness of breath.   Gastrointestinal:  Positive for heartburn.  Psychiatric/Behavioral:  Positive for depression. The patient is nervous/anxious.   All other systems reviewed and are negative.      Objective:   Physical Exam Vitals reviewed.  Constitutional:      General: He is not in acute distress.    Appearance: He is well-developed. He is obese.  HENT:     Head: Normocephalic.     Right Ear: Tympanic membrane normal.     Left Ear: Tympanic membrane normal.  Eyes:     General:        Right eye: No discharge.        Left eye: No discharge.     Pupils: Pupils are equal, round, and reactive to light.  Neck:     Thyroid: No thyromegaly.  Cardiovascular:     Rate and Rhythm: Normal rate and regular rhythm.     Heart sounds: Normal heart sounds. No murmur heard. Pulmonary:     Effort: Pulmonary effort is normal. No respiratory distress.     Breath sounds: Normal breath sounds. No wheezing.  Abdominal:     General: Bowel sounds are normal. There is no distension.     Palpations: Abdomen is soft.     Tenderness: There is no abdominal tenderness.  Musculoskeletal:        General: No tenderness. Normal range of motion.     Cervical back: Normal range of motion and neck supple.  Skin:    General: Skin is warm and dry.     Findings: No erythema or rash.  Neurological:     Mental Status: He is alert and oriented to person, place, and time.     Cranial Nerves: No cranial nerve deficit.     Motor: Weakness present.     Gait: Gait abnormal.     Deep Tendon Reflexes: Reflexes are normal and symmetric.  Psychiatric:        Behavior: Behavior normal.        Thought Content: Thought content normal.        Judgment: Judgment normal.        BP 106/66   Pulse 96   Temp (!) 97 F (36.1 C) (Temporal)   Ht _0  (1.88 m)   BMI 35.18 kg/m   Assessment & Plan:  [name] [name] comes in today with chief complaint of Medical  Management of Chronic Issues   Diagnosis and orders addressed:  1. Hypertension associated with diabetes ([place]) - CMP14+EGFR - CBC with Differential/Platelet  2. CAD S/P percutaneous coronary angioplasty - CMP14+EGFR - CBC with Differential/Platelet  3. Systolic congestive heart failure, unspecified HF chronicity (HCC) - CMP14+EGFR - CBC with Differential/Platelet  4. PAOD (peripheral arterial occlusive disease) (HCC) - CMP14+EGFR - CBC with Differential/Platelet  5. Coronary artery disease of native artery of native heart with stable angina pectoris (HCC) - CMP14+EGFR - CBC with Differential/Platelet  6. Atherosclerosis of arteries of extremities (HCC)  - CMP14+EGFR - CBC with Differential/Platelet  7. Gastroesophageal reflux disease without esophagitis - CMP14+EGFR - CBC with Differential/Platelet  8. Hyperlipidemia associated with type 2 diabetes mellitus (HCC) - CMP14+EGFR - CBC with Differential/Platelet  9. Diabetic peripheral neuropathy associated with type 2 diabetes mellitus (HCC) Will increase Mounjaro to 10 mg from 7.5 mg  Low carb diet  -  tirzepatide [facility]) 10 MG/0.5ML Pen; Inject 10 mg into the skin once a week.  Dispense: 6 mL; Refill: 2 - CMP14+EGFR - CBC with Differential/Platelet - Bayer DCA Hb A1c Waived  10. Statin myopathy - CMP14+EGFR - CBC with Differential/Platelet  11. Moderate episode of recurrent major depressive disorder (HCC)  - CMP14+EGFR - CBC with Differential/Platelet  12. Anxiety - CMP14+EGFR - CBC with Differential/Platelet   Labs pending Health Maintenance reviewed Diet and exercise encouraged  Follow up plan: 3 months   [name], FNP

## 2022-03-17 ENCOUNTER — Encounter: Payer: Self-pay | Admitting: Gastroenterology

## 2022-03-17 ENCOUNTER — Telehealth: Payer: Self-pay | Admitting: *Deleted

## 2022-03-17 ENCOUNTER — Ambulatory Visit (INDEPENDENT_AMBULATORY_CARE_PROVIDER_SITE_OTHER): Payer: Medicare Other | Admitting: Gastroenterology

## 2022-03-17 ENCOUNTER — Ambulatory Visit: Payer: Medicare Other | Admitting: Pharmacist

## 2022-03-17 VITALS — BP 113/70 | HR 86 | Temp 97.6°F | Ht 74.0 in | Wt 285.8 lb

## 2022-03-17 DIAGNOSIS — K5900 Constipation, unspecified: Secondary | ICD-10-CM

## 2022-03-17 DIAGNOSIS — Z860101 Personal history of adenomatous and serrated colon polyps: Secondary | ICD-10-CM

## 2022-03-17 DIAGNOSIS — Z86010 Personal history of colonic polyps: Secondary | ICD-10-CM

## 2022-03-17 DIAGNOSIS — D509 Iron deficiency anemia, unspecified: Secondary | ICD-10-CM | POA: Diagnosis not present

## 2022-03-17 DIAGNOSIS — K219 Gastro-esophageal reflux disease without esophagitis: Secondary | ICD-10-CM

## 2022-03-17 DIAGNOSIS — R1310 Dysphagia, unspecified: Secondary | ICD-10-CM

## 2022-03-17 LAB — CMP14+EGFR
ALT: 8 IU/L (ref 0–44)
AST: 17 IU/L (ref 0–40)
Albumin/Globulin Ratio: 1.4 (ref 1.2–2.2)
Albumin: 4.5 g/dL (ref 3.8–4.8)
Alkaline Phosphatase: 51 IU/L (ref 44–121)
BUN/Creatinine Ratio: 18 (ref 10–24)
BUN: 28 mg/dL — ABNORMAL HIGH (ref 8–27)
Bilirubin Total: 0.6 mg/dL (ref 0.0–1.2)
CO2: 23 mmol/L (ref 20–29)
Calcium: 10.3 mg/dL — ABNORMAL HIGH (ref 8.6–10.2)
Chloride: 98 mmol/L (ref 96–106)
Creatinine, Ser: 1.59 mg/dL — ABNORMAL HIGH (ref 0.76–1.27)
Globulin, Total: 3.2 g/dL (ref 1.5–4.5)
Glucose: 108 mg/dL — ABNORMAL HIGH (ref 70–99)
Potassium: 3.9 mmol/L (ref 3.5–5.2)
Sodium: 138 mmol/L (ref 134–144)
Total Protein: 7.7 g/dL (ref 6.0–8.5)
eGFR: 46 mL/min/{1.73_m2} — ABNORMAL LOW (ref >59)

## 2022-03-17 LAB — CBC WITH DIFFERENTIAL/PLATELET
Basophils Absolute: 0.1 10*3/uL (ref 0.0–0.2)
Basos: 1 %
EOS (ABSOLUTE): 0.1 10*3/uL (ref 0.0–0.4)
Eos: 2 %
Hematocrit: 34.3 % — ABNORMAL LOW (ref 37.5–51.0)
Hemoglobin: 10.1 g/dL — ABNORMAL LOW (ref 13.0–17.7)
Immature Grans (Abs): 0 10*3/uL (ref 0.0–0.1)
Immature Granulocytes: 0 %
Lymphocytes Absolute: 1.6 10*3/uL (ref 0.7–3.1)
Lymphs: 20 %
MCH: 22.1 pg — ABNORMAL LOW (ref 26.6–33.0)
MCHC: 29.4 g/dL — ABNORMAL LOW (ref 31.5–35.7)
MCV: 75 fL — ABNORMAL LOW (ref 79–97)
Monocytes Absolute: 0.6 10*3/uL (ref 0.1–0.9)
Monocytes: 7 %
Neutrophils Absolute: 5.6 10*3/uL (ref 1.4–7.0)
Neutrophils: 70 %
Platelets: 330 10*3/uL (ref 150–450)
RBC: 4.57 x10E6/uL (ref 4.14–5.80)
RDW: 15.9 % — ABNORMAL HIGH (ref 11.6–15.4)
WBC: 8 10*3/uL (ref 3.4–10.8)

## 2022-03-17 NOTE — Telephone Encounter (Signed)
  Request for patient to stop medication prior to procedure   03/17/22  [name] [name] Apr 14, 1951  What type of surgery is being performed? Colonsocopy/egd with possible dilation  When is surgery scheduled? TBD  Clearance to hold plavix x 5 days prior  Name of physician performing surgery?  Dr. Hurshel Keys [facility] Phone: [phone] Fax: [phone]  Anethesia type (none, local, MAC, general)? MAC

## 2022-03-17 NOTE — Patient Instructions (Addendum)
Please have blood work completed at [facility].  Please ask that they fax the results back to our office.  We will arrange for you to have an upper endoscopy with possible stretching of your esophagus and colonoscopy in the near future with Dr. Abbey Chatters. We are reaching out to your cardiologist and vascular surgeon to get the okay to hold Eliquis and Plavix before your procedures. You will have to hold [facility] for 1 week prior to your procedure. 1 day prior to your procedure: Take mealtime insulin as needed, one half dose of Humalog (30 units twice a day), one half dose of Tresiba (13 units at bedtime). Day of procedure: Do not take any morning diabetes medications.  Continue Pantoprazole 40 mg daily.   Swallowing precautions:  Eat slowly, take small bites, chew thoroughly, drink plenty of liquids throughout meals.  Avoid trough textures All meats should be chopped finely.  If something gets hung in your esophagus and will not come up or go down, proceed to the emergency room.    For constipation: Start Colace (docusate sodium) 100 mg daily.  You can increase this to twice daily if needed. Continue eating plenty of fruits, vegetables, whole grains to maintain adequate fiber intake. If you continue to struggle with constipation, please let me know and I will prescribe Linzess for you.  We will follow-up with you in the office after your procedures.  Do not hesitate to call sooner if you have questions or concerns.  It was great to see you again today!  Aliene Altes, PA-C [facility]

## 2022-03-17 NOTE — Telephone Encounter (Signed)
  03/17/22  [name] [name] [birth year]  What type of surgery is being performed? Colonoscopy/egd with possible dilation  When is surgery scheduled? TBD  Needs clearance to hold Eliquis x 1 day for procedure  Name of physician performing surgery?  Dr. Hurshel Keys [facility] Phone: [phone] Fax: [phone]  Anethesia type (none, local, MAC, general)? MAC

## 2022-03-17 NOTE — Telephone Encounter (Signed)
Please comment on Eliquis hold. Looks like PCP filled it last, though we follow for Afib.

## 2022-03-18 DIAGNOSIS — I2510 Atherosclerotic heart disease of native coronary artery without angina pectoris: Secondary | ICD-10-CM | POA: Diagnosis not present

## 2022-03-18 DIAGNOSIS — L97821 Non-pressure chronic ulcer of other part of left lower leg limited to breakdown of skin: Secondary | ICD-10-CM | POA: Diagnosis not present

## 2022-03-18 DIAGNOSIS — I70244 Atherosclerosis of native arteries of left leg with ulceration of heel and midfoot: Secondary | ICD-10-CM | POA: Diagnosis not present

## 2022-03-18 DIAGNOSIS — L97522 Non-pressure chronic ulcer of other part of left foot with fat layer exposed: Secondary | ICD-10-CM | POA: Diagnosis not present

## 2022-03-18 DIAGNOSIS — I480 Paroxysmal atrial fibrillation: Secondary | ICD-10-CM | POA: Diagnosis not present

## 2022-03-18 DIAGNOSIS — Z7901 Long term (current) use of anticoagulants: Secondary | ICD-10-CM | POA: Diagnosis not present

## 2022-03-18 DIAGNOSIS — I87312 Chronic venous hypertension (idiopathic) with ulcer of left lower extremity: Secondary | ICD-10-CM | POA: Diagnosis not present

## 2022-03-18 DIAGNOSIS — E1140 Type 2 diabetes mellitus with diabetic neuropathy, unspecified: Secondary | ICD-10-CM | POA: Diagnosis not present

## 2022-03-18 DIAGNOSIS — I50813 Acute on chronic right heart failure: Secondary | ICD-10-CM | POA: Diagnosis not present

## 2022-03-18 DIAGNOSIS — I739 Peripheral vascular disease, unspecified: Secondary | ICD-10-CM | POA: Diagnosis not present

## 2022-03-18 NOTE — Telephone Encounter (Signed)
Patient with diagnosis of atrial fibrillation on Eliquis for anticoagulation.    Procedure: colonoscopy/EGD with possible dilaton Date of procedure: TBD   CHA2DS2-VASc Score = 5   This indicates a 7.2% annual risk of stroke. The patient's score is based upon: CHF History: 1 HTN History: 1 Diabetes History: 1 Stroke History: 0 Vascular Disease History: 1 Age Score: 1 Gender Score: 0   CrCl 78 Platelet count 330  Per office protocol, patient can hold Eliquis for 1 days prior to procedure.   Patient will not need bridging with Lovenox (enoxaparin) around procedure.  **This guidance is not considered finalized until pre-operative APP has relayed final recommendations.**

## 2022-03-18 NOTE — Telephone Encounter (Signed)
Still awaiting approval

## 2022-03-18 NOTE — Telephone Encounter (Signed)
Reviewed. OK to hold Eliquis x 1 day prior to procedures.   Still need approval to hold Plavix from Vascular Surgery.

## 2022-03-18 NOTE — Telephone Encounter (Signed)
   Patient Name: [name] [name]  DOB: Sep 24, 1950 MRN: [id number]  Primary Cardiologist: Jenkins Rouge, MD  Chart reviewed as part of pre-operative protocol coverage. Pre-op clearance already addressed by colleagues in earlier phone notes. To summarize recommendations:  -Per our pharmacy team, okay to hold Eliquis x1 day prior to the procedure.  Please restart when medically safe to do so.  Will route this bundled recommendation to requesting provider via Epic fax function and remove from pre-op pool. Please call with questions.  Elgie Collard, PA-C 03/18/2022, 1:20 PM

## 2022-03-19 ENCOUNTER — Encounter: Payer: Self-pay | Admitting: Neurology

## 2022-03-19 ENCOUNTER — Ambulatory Visit: Payer: Medicare Other | Admitting: Pharmacist

## 2022-03-19 ENCOUNTER — Ambulatory Visit (INDEPENDENT_AMBULATORY_CARE_PROVIDER_SITE_OTHER): Payer: Medicare Other | Admitting: Neurology

## 2022-03-19 ENCOUNTER — Telehealth: Payer: Self-pay | Admitting: Neurology

## 2022-03-19 VITALS — BP 96/63 | HR 85 | Ht 75.0 in | Wt 280.4 lb

## 2022-03-19 DIAGNOSIS — I70209 Unspecified atherosclerosis of native arteries of extremities, unspecified extremity: Secondary | ICD-10-CM

## 2022-03-19 DIAGNOSIS — E538 Deficiency of other specified B group vitamins: Secondary | ICD-10-CM | POA: Diagnosis not present

## 2022-03-19 DIAGNOSIS — R413 Other amnesia: Secondary | ICD-10-CM

## 2022-03-19 DIAGNOSIS — E1169 Type 2 diabetes mellitus with other specified complication: Secondary | ICD-10-CM

## 2022-03-19 NOTE — Patient Instructions (Addendum)
Aricept(Donepezil), Memantine(Namenda) New drug: Lecanumab Formal Memory Testing with dr Sima Matas then go from there, once memory testing is complete and I get the report if anything concerning we meet again and discuss next steps  Tremor A tremor is trembling or shaking that you cannot control. Most tremors affect the hands or arms. Tremors can also affect the head, vocal cords, face, and other parts of the body. There are many types of tremors. Common types include: Essential tremor. These usually occur in people older than 40. This type of tremor may run in families and can happen in otherwise healthy people. Resting tremor. These occur when the muscles are at rest, such as when your hands are resting in your lap. People with Parkinson's disease often have resting tremors. Postural tremor. These occur when you try to hold a pose, such as keeping your hands outstretched. Kinetic tremor. These occur during purposeful movement, such as trying to touch a finger to your nose. Task-specific tremor. These may occur when you do certain tasks such as writing, speaking, or standing. Psychogenic tremor. These are greatly reduced or go away when you are distracted. These tremors happen due to underlying stress or psychiatric disease. They can happen in people of all ages. Some types of tremors have no known cause. Tremors can also be a symptom of nervous system problems (neurological disorders) that may occur with aging. Some tremors go away with treatment, while others do not. Follow these instructions at home: Lifestyle     If you drink alcohol: Limit how much you have to: 0-1 drink a day for women who are not pregnant. 0-2 drinks a day for men. Know how much alcohol is in a drink. In the [place] one drink equals one 12 oz bottle of beer (355 mL), one 5 oz glass of wine (148 mL), or one 1 oz glass of hard liquor (44 mL). Do not use any products that contain nicotine or tobacco. These products  include cigarettes, chewing tobacco, and vaping devices, such as e-cigarettes. If you need help quitting, ask your health care provider. Avoid extreme heat and extreme cold. Limit your caffeine intake, as told by your health care provider. Try to get 8 hours of sleep each night. Find ways to manage your stress, such as meditation or yoga. General instructions Take over-the-counter and prescription medicines only as told by your health care provider. Keep all follow-up visits. This is important. Contact a health care provider if: You develop a tremor after starting a new medicine. You have a tremor along with other symptoms such as: Numbness. Tingling. Pain. Weakness. Your tremor gets worse. Your tremor interferes with your day-to-day life. Summary A tremor is trembling or shaking that you cannot control. Most tremors affect the hands or arms. Some types of tremors have no known cause. Others may be a symptom of nervous system problems (neurological disorders). Make sure you discuss any tremors you have with your health care provider. This information is not intended to replace advice given to you by your health care provider. Make sure you discuss any questions you have with your health care provider. Document Revised: 02/21/2021 Document Reviewed: 02/21/2021 Elsevier Patient Education  [place] Tremor A tremor is trembling or shaking that a person cannot control. Most tremors affect the hands or arms. Tremors can also affect the head, vocal cords, legs, and other parts of the body. Essential tremor is a tremor without a known cause. Usually, it occurs while a person is trying  to perform an action. It tends to get worse gradually as a person ages. What are the causes? The cause of this condition is not known, but it often runs in families. What increases the risk? You are more likely to develop this condition if: You have a family member with essential  tremor. You are 62 years of age or older. What are the signs or symptoms? The main sign of a tremor is a rhythmic shaking of certain parts of your body that is uncontrolled and unintentional. You may: Have difficulty eating with a spoon or fork. Have difficulty writing. Nod your head up and down or side to side. Have a quivering voice. The shaking may: Get worse over time. Come and go. Be more noticeable on one side of your body. Get worse due to stress, tiredness (fatigue), caffeine, and extreme heat or cold. How is this diagnosed? This condition may be diagnosed based on: Your symptoms and medical history. A physical exam. There is no single test to diagnose an essential tremor. However, your health care provider may order tests to rule out other causes of your condition. These may include: Blood and urine tests. Imaging studies of your brain, such as a CT scan or MRI. How is this treated? Treatment for essential tremor depends on the severity of the condition. Mild tremors may not need treatment if they do not affect your day-to-day life. Severe tremors may need to be treated using one or more of the following options: Medicines. Injections of a substance called botulinum toxin. Procedures such as deep brain stimulation (DBS) implantation or MRI-guided ultrasound treatment. Lifestyle changes. Occupational or physical therapy. Follow these instructions at home: Lifestyle  Do not use any products that contain nicotine or tobacco. These products include cigarettes, chewing tobacco, and vaping devices, such as e-cigarettes. If you need help quitting, ask your health care provider. Limit your caffeine intake as told by your health care provider. Try to get 8 hours of sleep each night. Find ways to manage your stress that fit your lifestyle and personality. Consider trying meditation or yoga. Try to anticipate stressful situations and allow extra time to manage them. If you are  struggling emotionally with the effects of your tremor, consider working with a mental health provider. General instructions Take over-the-counter and prescription medicines only as told by your health care provider. Avoid extreme heat and extreme cold. Keep all follow-up visits. This is important. Visits may include physical therapy visits. Where to find more information [employer] of Neurological Disorders and Stroke: [employer] Contact a health care provider if: You experience any changes in the location or intensity of your tremors. You start having a tremor after starting a new medicine. You have a tremor with other symptoms, such as: Numbness. Tingling. Pain. Weakness. Your tremor gets worse. Your tremor interferes with your daily life. You feel down, blue, or sad for at least 2 weeks in a row. Worrying about your tremor and what other people think about you interferes with your everyday life functions, including relationships, work, or school. Summary Essential tremor is a tremor without a known cause. Usually, it occurs when you are trying to perform an action. You are more likely to develop this condition if you have a family member with essential tremor. The main sign of a tremor is a rhythmic shaking of certain parts of your body that is uncontrolled and unintentional. Treatment for essential tremor depends on the severity of the condition. This information is not intended to  replace advice given to you by your health care provider. Make sure you discuss any questions you have with your health care provider. Document Revised: 02/21/2021 Document Reviewed: 02/21/2021 Elsevier Patient Education  [place].  Alzheimer's Disease Alzheimer's disease is a brain disease that affects memory, thinking, language, and behavior. People with Alzheimer's disease lose mental abilities, and the disease gets worse over time. Alzheimer's disease is a form of  dementia. What are the causes? This condition develops when a protein called beta-amyloid forms deposits in the brain. It is not known what causes these deposits to form. Alzheimer's disease may also be caused by a gene mutation that is inherited from one parent or both parents. A gene mutation is a harmful change in a gene. Not everyone who inherits the genetic mutation will get the disease. What increases the risk? You are more likely to develop this condition if you: Are older than age 18. Are male. Have any of these medical conditions: High blood pressure. Diabetes. Heart or blood vessel disease. Smoke. Have obesity. Have had a brain injury. Have had a stroke. Have a family history of dementia. What are the signs or symptoms? Symptoms of this condition may happen in three stages, which often overlap. Early stage In this stage, you may continue to be independent. You may still be able to drive, work, and be social. Symptoms in this stage include: Minor memory problems, such as forgetting a name, words, or what you did recently. Difficulty with: Paying attention. Communicating. Doing familiar tasks. Problem solving or doing calculations. Following instructions. Learning new things. Anxiety. Social withdrawal. Loss of motivation. Moderate stage In this stage, you will start to need care. Symptoms in this stage include: Difficulty with expressing thoughts. Memory loss that affects daily life. This can include forgetting: Recent events that have happened. If you have taken medicines or eaten. Familiar places. You may get lost while walking or driving. To pay bills or manage finances. Personal hygiene such as bathing or using the bathroom. Confusion about where you are or what time it is. Difficulty in judging distance. Changes in personality, mood, and behavior. You may be moody, irritable, angry, frustrated, fearful, anxious, or suspicious. Poor reasoning and  judgment. Delusions or hallucinations. Changes in sleep patterns. Severe stage In the final stage, you will need help with your personal care and daily activities. Symptoms in this stage include: Worsening memory loss. Personality changes. Loss of awareness of your surroundings. Changes in physical abilities, including the ability to walk, sit, and swallow. Difficulty in communicating. Inability to control your bladder and bowels. Increasing confusion. Increasing behavior changes. How is this diagnosed?  This condition is diagnosed by a health care provider who specializes in diseases of the nervous system (neurologist) or one who specializes in care of the elderly (geriatrician or geriatric psychiatrist). Other causes of dementia may also be ruled out. Your health care provider will talk with you and your family, friends, or caregivers about your history and symptoms. A thorough medical history will be taken, and you will have a physical exam and tests. Tests may include: Lab tests, such as blood or urine tests. Imaging tests, such as a CT scan, a PET scan, or an MRI. A lumbar puncture. This test involves removing and testing a small amount of the fluid that surrounds the brain and spinal cord. An electroencephalogram (EEG). In this test, small metal discs are used to measure electrical activity in the brain. Memory tests, cognitive tests, and neuropsychological tests. These  tests evaluate brain function. Genetic testing. This may be done if you have early onset of the disease (before age 26) or if other family members have the disease. How is this treated? At this time, there is no treatment to cure Alzheimer's disease or stop it from getting worse. The goals of treatment are: To manage behavioral changes. To provide you with a safe environment. To help manage daily life for you and your caregivers. The following treatment options are available: Medicines. Medicines may help the memory  work better and manage behavioral symptoms. Cognitive therapy. Cognitive therapy provides you with education, support, and memory aids. It is most helpful in the early stages of the condition. Counseling or spiritual guidance. It is normal to have a lot of feelings, including anger, relief, fear, and isolation. Counseling and guidance can help you deal with these feelings. Caregiving. This involves having caregivers help you with your daily activities. Family support groups. These provide education, emotional support, and information about community resources to family members who are taking care of you. Follow these instructions at home:  Medicines Take over-the-counter and prescription medicines only as told by your health care provider. Use a pill organizer or pill reminder to help you manage your medicines. Avoid taking medicines that can affect thinking, such as pain medicines or sleeping medicines. Lifestyle Make healthy lifestyle choices: Be physically active as told by your health care provider. Regular exercise may help improve symptoms. Do not use any products that contain nicotine or tobacco, such as cigarettes, e-cigarettes, and chewing tobacco. If you need help quitting, ask your health care provider. Do not drink alcohol. Eat a healthy diet. Practice stress-management techniques when you get stressed. Stay social. Drink enough fluid to keep your urine pale yellow. Make sure to get quality sleep. Avoid taking long naps during the day. Take short naps of 30 minutes or less if needed. Keep your sleeping area dark and cool. Avoid exercising during the few hours before you go to bed. Avoid caffeine products in the afternoon and evening. General instructions Work with your health care provider to determine what you need help with and what your safety needs are. If you were given a bracelet that identifies you as a person with memory loss or tracks your location, make sure to wear  it at all times. Talk with your health care provider about whether it is safe for you to drive. Work with your family to make important decisions, such as advance directives, medical power of attorney, or a living will. Keep all follow-up visits. This is important. Where to find more information The Alzheimer's [employer]: Call the 24-hour helpline at 1-904-763-9486, or visit [employer] Contact a health care provider if: You have nausea, vomiting, or trouble with eating related to a medicine. You have worsening mood or behavior changes, such as depression, anxiety, or hallucinations. You or your family members become concerned for your safety. Get help right away if: You become less responsive or are difficult to wake up. Your memory suddenly gets worse. You feel that you want to harm yourself. If you ever feel like you may hurt yourself or others, or have thoughts about taking your own life, get help right away. Go to your nearest emergency department or: Call your local emergency services (911 in the [place] Call a suicide crisis helpline, such as the [facility] at 334-387-7651 or 988 in the [place]. This is open 24 hours a day in the [place] Text the Crisis Text Line  at 928-235-1149 (in the [place] Summary Alzheimer's disease is a brain disease that affects memory, thinking, language, and behavior. Alzheimer's disease is a form of dementia. This condition is diagnosed by a specialist in diseases of the nervous system (neurologist) or one who specializes in care of the elderly. At this time, there is no treatment to cure Alzheimer's disease or stop it from getting worse. The goal of treatment is to help you manage any symptoms. Work with your family to make important decisions, such as advance directives, medical power of attorney, or a living will. This information is not intended to replace advice given to you by your health care provider. Make sure you discuss any questions  you have with your health care provider. Document Revised: 11/27/2020 Document Reviewed: 08/21/2019 Elsevier Patient Education  [place].

## 2022-03-19 NOTE — Progress Notes (Signed)
    03/19/2022 Name: [name] [name] MRN: [id number] DOB: [birth year]   Patient no longer in cardiac/lipid trial.  He continues on zetia & fenofibrate.  Reviewed extensive allergy/intolerance list.  Patient was coded for statin myopathy at PCP visit on 03/16/22.  Recommended repeat lipid panel.  Wilder Glade was d/c'd by nephrologist and Darcel Bayley was started.  He is tolerating Mounjaro well and will increase to '10mg'$  tomorrow.  Encouraged patient to reach out if needed.  Medication intolerance list updated    Regina Eck, PharmD, BCPS Clinical Pharmacist, [place]  II Phone [phone]

## 2022-03-19 NOTE — Progress Notes (Signed)
[facility]    Provider:  Dr Jaynee Eagles Requesting Provider: [name], FNP Primary Care Physician:  [name], FNP    CC: TIA and memory loss  14/08/8183: Very complicated patient: He has a past medical history of untreated sleep apnea, morbid obesity, B12 deficiency on supplementation, syncope, depression, intolerance to statin, leg ulcers status post popliteal bypass graft and femoral artery stenting.  Also cerebrovascular disease, headaches, chronic kidney disease, peripheral vascular disease, myocardial infarction status post stent July 30, 2016 follows with cardiology and is in a clinical trial for hyperlipidemia, followed by pain management every 2 to 3 months, chronic kidney disease followed by nephrology, followed by vascular and had stent placement in his right leg,, medical noncompliance, hypertension, hyperlipidemia, diabetes, depression, coronary artery disease, arthritis, anxiety, cerebrovascular disease, congestive heart failure, A. fib, diabetic peripheral neuropathy, lacunar infarct, degenerative disc disease, drug-induced myopathy, obesity, former smoker with greater than 30-pack-year history.  He declines to wear CPAP.  We have evaluated him in the past for multiple other disorders including cerebrovascular disease, lacunar strokes, noncompliance with CPAP and untreated obstructive sleep apnea with morning headaches.  He feels like his memory is a lot worse especially since hospitalization multiple times for sepsis. He had sepsis and encephalopathy and didn't a few days. Since the hospital more fatigued, short-term memory worse, he is still in recovery, poor energy, had sepsis 3x in a row. Discussed aricept, memantine and lecanumab. Wifeis here and it takes him much longer to come up with words, to bring his memories up. Long term memory is good. Uses the wrong word. He has an action tremor. Discussed PET Scan amyloid scan, genetic testing. Out of 14 kids  on mother's side 13 had dementia, alzheimers. If cardiology is ok with it, we can start a little donepezil, he declines until workup   01/24/2022: MRI brain and cervical spine: EXAM:personally reviewed images and agree MRI HEAD WITHOUT CONTRAST   MRI CERVICAL SPINE WITHOUT CONTRAST   TECHNIQUE: Multiplanar, multiecho pulse sequences of the brain and surrounding structures, and cervical spine, to include the craniocervical junction and cervicothoracic junction, were obtained without intravenous contrast.   COMPARISON:  Brain MRI 11/23/2020   FINDINGS: MRI HEAD FINDINGS   Brain: Motion degraded but still overall diagnostic. Negative for infarct, hemorrhage, hydrocephalus, mass, or collection. No evidence of intracranial purulence. Generalized brain atrophy.   Vascular: Abnormal left vertebral and skull base ICA flow voids, stable from 2022.   Skull and upper cervical spine: No evidence of marrow lesion   Sinuses/Orbits: No acute finding   MRI CERVICAL SPINE FINDINGS   Only motion degraded sagittal images could be obtained before the patient could no longer continue the study. STIR imaging is negative for edema or fracture. The spinal canal is patent without evidence of collection. ACDF at C5-6 and C6-7.   IMPRESSION: Brain MRI:   Motion degraded but diagnostic study that is negative for acute finding. No cerebral abscess.   Cervical MRI:   Motion degraded and truncated study due to patient condition. No acute finding in the cervical spin  02/24/2021: Patient here for follow up of TIA in July. He has a past medical history of untreated sleep apnea, morbid obesity, B12 deficiency on supplementation, syncope, depression, intolerance to statin, leg ulcers status post popliteal bypass graft and femoral artery stenting.  Also cerebrovascular disease, headaches, chronic kidney disease, peripheral vascular disease, myocardial infarction status post stent July 30, 2016 follows with  cardiology and is in a clinical  trial for hyperlipidemia, followed by pain management every 2 to 3 months, chronic kidney disease followed by nephrology, followed by vascular and had stent placement in his right leg,, medical noncompliance, hypertension, hyperlipidemia, diabetes, depression, coronary artery disease, arthritis, anxiety, cerebrovascular disease, congestive heart failure, A. fib, diabetic peripheral neuropathy, lacunar infarct, degenerative disc disease, drug-induced myopathy, obesity, former smoker with greater than 30-pack-year history.  He declines to wear CPAP.  We have evaluated him in the past for multiple other disorders including cerebrovascular disease, lacunar strokes, noncompliance with CPAP and untreated obstructive sleep apnea with morning headaches.  Patient was seen at the emergency room November 23, 2020 for left upper extremity numbness and ataxia, he woke up with left thumb index and middle finger numbness, ataxic on PT evaluation, patient's Eliquis was held for a week prior to recent surgery of superficial femoral artery stenting, in that setting he awoke with numbness in his arm, multiple stroke risk factors, MRI of the brain was negative for acute abnormalities, but MRI of the head and neck showed severe distal left ICA near occlusive stenosis with a decreased flow in the intracranial left ICA and MCA, chronic left VA occlusive severe stenosis bilateral cavernous ICAs, right ICA about 40 to 50% stenosis.  Symptoms resolved that the MRI of the head and neck concerning for new left ICA high-grade stenosis with downstream MCA decreased flow.  CTA was recommended but he declined.  He is back on Eliquis and Plavix.      HPI January 01, 2020: Patient here as a new request from [facility] for near syncope.  He has a past medical history of untreated sleep apnea, headaches, chronic kidney disease, peripheral vascular disease, myocardial infarction status post stent July 30, 2016  follows with cardiology and is in a clinical trial for hyperlipidemia, followed by pain management every 2 to 3 months, chronic kidney disease followed by nephrology, followed by vascular and had stent placement in his right leg,, medical noncompliance, hypertension, hyperlipidemia, diabetes, depression, coronary artery disease, arthritis, anxiety, cerebrovascular disease, congestive heart failure, A. fib, diabetic peripheral neuropathy, lacunar infarct, degenerative disc disease, drug-induced myopathy, obesity, former smoker with greater than 30-pack-year history.  He declines to wear CPAP.  We have evaluated him in the past for multiple other disorders including cerebrovascular disease, lacunar strokes, noncompliance with CPAP and untreated obstructive sleep apnea with morning headaches.  I reviewed Dr. Lenna Gilford notes: Patient had near syncope had a steroid injection about 2 weeks prior to appointment and had one episode of blacking out, blurred vision and headache.  He follows with cardiology and was referred back to [place] for near syncope.  He has a statin allergy.  The episode occurred June 12, his vision became dark and he felt weak, in the office his blood pressure was decreased, reviewed recent labs October 30, 2019 included a normal CBC, CMP with creatinine 1.56 and BUN 29, hemoglobin A1c 6.9.   He is here with his wife today who also provides much information, he is having episodes where he passes out, last one was on June 26 he was in the truck, it was hot, he jumped out of the truck and had to sister and when he straightened up that is when it started, he could hear family asking questions but could not talk, he could not see at all after they got home, he did not go to the hospital. He has had 2 of them, last one was the worst, he has had 2 of them, the  first one he was on a golf cart, he got up to come in the house and he made it to the living room, started getting lightheaded, felt like he was going to pass  out, laying down can revent the passing out, for a few seconds, vision tunnels in, started wearing and looks pale, feels clammy. Blood pressures have been low. He doesn't drink a lot he has cut back on sodas. He went to cardiology and blood pressure is improved and hasn;t had an episode since June. He doesn't urinate on himself or lose bowel control or have abnormal movements with the syncopal episodes. No post-ictal confusion. He has untreated seep apnea. No episodes since adjusting his blood pressure medications. No focal weakness, speech changes, stroke-like episodes. No other focal neurologic deficits, associated symptoms, inciting events or modifiable factors.During episodes BP has been as low as 80/56. He feel foggy, memory loss. FHx of dementia. Driving, not getting lost, no accidents, still performing all your ADLS IADLs, no personality changes or hallucinations or delusions.   CC:  Headaches, untreated sleep apnea  Interval history 03/15/2019: He wakes up with headaches and goes to sleep with headaches. He has pain pills . It can be anywhere, on either side, both sides, pounding, it gets better during the day. He wakes up with headaches. No hx of migraines. No significant l ight sensitivity, no sound sensitivity, no nausea or vomiting. They can't travel, the virus has been difficult, he wanted to travel, he is stuck at home. The headache is throbbing. No tearing of the eyes or other autonomic symptoms. Can hurt right behind the eye. It is most days. It has gotten better lately but can be bad. Also the pollen was bad. He woke up one morning at 4am with a headache, his ear was covered with blood. Headaches associated with pressure in the sinuses and ear.   Reviewed CT Head: 02/20/2019: Stable left basal ganglia lacunar infarct. Mild atrophic changes are noted commenced with the patient's given age. No findings to suggest acute hemorrhage, acute infarction or space-occupying mass lesion are noted.    Vascular: No hyperdense vessel or unexpected calcification.   Skull: Normal. Negative for fracture or focal lesion.   Sinuses/Orbits: No acute finding.   Other: None.   IMPRESSION: Chronic changes as described. No acute intracranial abnormality is noted.    Interval history 03/14/2019: 71 y.o. male here as requested by [name], FNP for intractable headache. PMHx medical noncompliance, hypertension, hyperlipidemia, myocardial infarction, disorders of iron metabolism, diabetes, depression, cellulitis, arthritis, anxiety, coronary artery disease, occlusion and stenosis of vertebral artery, congestive heart failure, A. fib, periodontal disease, diabetic neuropathy. He is seeing my colleague Dr. Brett Fairy for sleep apnea. However despite aoneas and snoring, he states he would never wear a cpap. He has been a many-decade long smoker.   Interval History 03/29/2018: RANKIN COOLMAN is a 71 y.o. male here as a referral from Dr. Lenna Gilford for atherosclerotic disease. PMHx HTN, diabetes, diabetic neuropathy, HLD, depression, obesity, afib. Not smoking. He is morbidly obese. He still snores. Not wearing cpap. He is having a sleep consultation Thursday. Discussed weight loss. Discussed trying to manage his diabetes closely, hga1c 8 (goal is <7),  Discussed weight and weight loss and he declines a referral to the [facility]. Triglycerides too high to calculate LDL, he sees cardiology and is in a study for his cholesterol discussed ldl goal < 70, untreated sleep apnea. No syncopal episodes since stopping metoprolol. No  new events.    reviewed images of CT 01/2018 and agree with the following:  IMPRESSION: 1. No acute intracranial process. 2. Old LEFT basal ganglia lacunar infarct, otherwise negative non-contrast CT HEAD for age.  Interval history 01/13/2017: Patient is here with his wife, he is lovely. He has good days and bad days, he has 1-2 cigarrettes every 2 weeks, he does not  drink alcohol. He is on Eliquis and Plavix. Loop recorder identified afib. He has palpitations and yesterday he gets short of breath.His metoprolol was increased. He has shallow breathing and stops breathing at night.    HPI 05/24/2016:  YASHAR INCLAN is a 71 y.o. male here as a referral from Dr. Lenna Gilford for atherosclerotic disease. PMHx HTN, diabetes, diabetic neuropathy, HLD, depression, obesity. Patient is here after admission to the [facility] in early January of this year for visual field changes, blurry vision, Sharp pain behind his right eye and right-sided headache.He was admitted to telemetry for further evaluation. Carotid ultrasound did not show any significant ICA stenosis. MRI of the brain without contrast showed chronic lacunar infarcts but nothing new. Imaging of the vessels showed atheromatous disease. EEG was normal. He did have a complete stroke workup and he is here for follow-up today. He was changed from aspirin to Plavix inpatient.   He stopped taking the Plavix. He has gone back to taking a baby aspirin. He is trying to manage his diabetes. He is on Trulicity now and working on Mudlogger with pcp. He is taking his glucose multiple times a day and adjusting insulin sliding scale. He tried a statin again and he can't tolerate it and stopped it. He is trying to eat better. He sees Administrator, arts.  He had some right sided sharp neck pain that radiated down the right arm. Since he left the hospital he can hear his heart beat in his neck radiating to the right ear where the sharp pain was (points to the carotid on the right)   Reviewed notes, labs and imaging from outside physicians, which showed:   HgbA1c 10 05/2016 Triglycerides 647 Cholesterol 239 Cannot calculat LDL   MRI brain:   IMPRESSION: 1. No acute intracranial abnormality. 2. Chronic left basal ganglia lacunar infarct.   MRA head: 1. No visible flow in the left V4 segment. See MRA neck that is reported  separately. 2. High-grade atheromatous narrowings at the left ICA anterior genu, bilateral M2 branches, and bilateral P2 branches.   MRA neck : 1. Thready flow within the left vertebral artery which is new compared to 2015 imaging. Suspect a high-grade proximal left subclavian stenosis favoring steal phenomenon. Dissection is the main differential consideration. CTA is recommended to further evaluate. 2. Cervical carotid atherosclerosis with ~ 40% proximal left ICA Stenosis.   CT Angio of the neck: 1. Left V3 segment occlusion. Distal left V4 reconstitution by the basilar with patent left PICA. 2. Diffuse narrowing and luminal irregularity of the left vertebral artery. Correlate for neck pain as dissection or atherosclerosis and underfilling could give this appearance. 3. Mild atherosclerotic narrowing of the left subclavian ostium. No subclavian flow limiting stenosis. 4. Cervical carotid atherosclerosis with 30 to 40 % ICA narrowing on the left.    Review of Systems:  Patient complains of symptoms per HPI as well as the following symptoms: short-term memory loss . Pertinent negatives and positives per HPI. All others negative    Social History   Socioeconomic History   Marital status: Married  Spouse name: Belenda Cruise   Number of children: 2   Years of education: 12   Highest education level: 12th grade  Occupational History   Occupation: retired  Tobacco Use   Smoking status: Former    Packs/day: 0.25    Years: 51.00    Total pack years: 12.75    Types: Cigarettes    Quit date: 08/14/2016    Years since quitting: 5.5   Smokeless tobacco: Never   Tobacco comments:    smokes  a pack a week  Vaping Use   Vaping Use: Never used  Substance and Sexual Activity   Alcohol use: No    Alcohol/week: 0.0 standard drinks of alcohol   Drug use: No   Sexual activity: Yes  Other Topics Concern   Not on file  Social History Narrative   Lives with his wife.  Retired.   Unable to afford expensive medicines.     He has 2 children from previous marriage - they live in [place]       Caffeine: 1 cup of 1/2 caff coffee in AM, drinks more if at a restaurant    Social Determinants of Health   Financial Resource Strain: Medium Risk (01/06/2021)   Overall Financial Resource Strain (CARDIA)    Difficulty of Paying Living Expenses: Somewhat hard  Food Insecurity: No Food Insecurity (02/16/2022)   Hunger Vital Sign    Worried About Running Out of Food in the Last Year: Never true    Ran Out of Food in the Last Year: Never true  Transportation Needs: No Transportation Needs (02/16/2022)   PRAPARE - Hydrologist (Medical): No    Lack of Transportation (Non-Medical): No  Physical Activity: Insufficiently Active (01/06/2021)   Exercise Vital Sign    Days of Exercise per Week: 7 days    Minutes of Exercise per Session: 10 min  Stress: No Stress Concern Present (02/16/2022)   [place]    Feeling of Stress : Only a little  Social Connections: Moderately Isolated (01/06/2021)   Social Connection and Isolation Panel [NHANES]    Frequency of Communication with Friends and Family: More than three times a week    Frequency of Social Gatherings with Friends and Family: Twice a week    Attends Religious Services: Never    Marine scientist or Organizations: No    Attends Archivist Meetings: Never    Marital Status: Married  Human resources officer Violence: Not At Risk (02/16/2022)   Humiliation, Afraid, Rape, and Kick questionnaire    Fear of Current or Ex-Partner: No    Emotionally Abused: No    Physically Abused: No    Sexually Abused: No    Family History  Problem Relation Age of Onset   Diabetes Father    Valvular heart disease Father    Arthritis Father    Heart disease Father    Stroke Father    Alzheimer's disease Mother    Hyperlipidemia Mother     Hypertension Mother    Arthritis Mother    Lung cancer Mother    Stroke Mother    Headache Mother    Arthritis/Rheumatoid Sister    Diabetes Sister    Hypertension Sister    Hyperlipidemia Sister    Depression Sister    Dementia Maternal Aunt    Dementia Maternal Uncle    Heart disease Maternal Uncle    Stomach cancer Paternal Uncle  Colon cancer Neg Hx    Liver disease Neg Hx     Past Medical History:  Diagnosis Date   Anxiety    Arthritis    Atrial fibrillation (HCC)    CAD (coronary artery disease)    a. 2010: DES to CTO of RCA. EF 55% b. 07/2016: cath showing total occlusion within previously placed RCA stent (collaterals present), severe stenosis along LCx and OM1 (treated with 2 overlapping DES). c. repeat cath in 01/2018 showing patent stents along LCx and OM with CTO of D2, CTO of distal LCx, and CTO of RCA with collaterals present overall unchanged since 2018 with medical management recom   Cellulitis and abscess rt groin    Complication of anesthesia    " I woke up during a colonoscopy "      Depression    Diabetes mellitus    Diastolic CHF ([place])    Disorders of iron metabolism    Dysrhythmia    Fibromyalgia    GERD (gastroesophageal reflux disease)    History of hiatal hernia    Hyperlipidemia    Hypertension    Low serum testosterone level    Medically noncompliant    Myocardial infarction ([place])    05-23-20   Pneumonia    Sepsis ([place])    2023    Past Surgical History:  Procedure Laterality Date   ABDOMINAL AORTOGRAM W/LOWER EXTREMITY N/A 03/21/2019   Procedure: ABDOMINAL AORTOGRAM W/LOWER EXTREMITY;  Surgeon: [name], MD;  Location: [place] [facility];  Service: Cardiovascular;  Laterality: N/A;   ABDOMINAL AORTOGRAM W/LOWER EXTREMITY N/A 11/12/2020   Procedure: ABDOMINAL AORTOGRAM W/LOWER EXTREMITY;  Surgeon: [name], MD;  Location: [place] [facility];  Service: Cardiovascular;  Laterality: N/A;   ABDOMINAL AORTOGRAM W/LOWER EXTREMITY  N/A 01/28/2021   Procedure: ABDOMINAL AORTOGRAM W/LOWER EXTREMITY;  Surgeon: [name], MD;  Location: [place] [facility];  Service: Cardiovascular;  Laterality: N/A;   ABDOMINAL AORTOGRAM W/LOWER EXTREMITY Bilateral 07/29/2021   Procedure: ABDOMINAL AORTOGRAM W/LOWER EXTREMITY;  Surgeon: [name], MD;  Location: [place] [facility];  Service: Cardiovascular;  Laterality: Bilateral;   ANGIOPLASTY N/A 08/15/2021   Procedure: ATTEMPTED RIGHT PERONEAL  ANGIOPLASTY, LEFT PERONEAL ANGIOPLASTY;  Surgeon: [name], MD;  Location: [place];  Service: Vascular;  Laterality: N/A;   BACK SURGERY  2015   ACDF by Dr. Orland Jarred STUDY  12/23/2021   Procedure: BUBBLE STUDY;  Surgeon: Elouise Munroe, MD;  Location: [facility] END[facility]COPY;  Service: Cardiology;;   COLONOSCOPY N/A 10/01/2014   Dr. [name]: multiple tubular adenomas removed, colonic diverticulosis, redundant colon. next tcs advised for 09/2017. PATIENT NEEDS PROPOFOL FOR FAILED CONSCIOUS SEDATION   CORONARY STENT INTERVENTION N/A 07/30/2016   Procedure: Coronary Stent Intervention;  Surgeon: [name], MD;  Location: [place] [facility];  Service: Cardiovascular;  Laterality: N/A;   CORONARY STENT PLACEMENT  2000   By Dr. Olevia Perches   EP IMPLANTABLE DEVICE N/A 05/25/2016   Procedure: Loop Recorder Insertion;  Surgeon: [name], MD;  Location: [place] [facility];  Service: Cardiovascular;  Laterality: N/A;   ESOPHAGOGASTRODUODENOSCOPY     esophagus [facility] at [facility]   ESOPHAGOGASTRODUODENOSCOPY N/A 10/01/2014   Dr. [name]: patchy mottling/erythema and minimal polypoid appearance of gastric mucosa. bx with mild inlammation but no H.pylori   FEMORAL-POPLITEAL BYPASS GRAFT Left 11/22/2020   Procedure: LEFT FEMORAL-POPLITEAL BYPASS GRAFT;  Surgeon: [name], MD;  Location: [place];  Service:  Vascular;  Laterality: Left;   FEMORAL-POPLITEAL BYPASS GRAFT Left 08/15/2021   Procedure: REDO LEFT FEMORAL-POPLITEAL BYPASS USING  PROPATEN GRAFT;  Surgeon: [name], MD;  Location: [place];  Service: Vascular;  Laterality: Left;   HERNIA REPAIR  7106   umbilical   INSERTION OF ILIAC STENT Right 11/22/2020   Procedure: INSERTION OF ELUVIA STENT INTO RIGHT DISTAL SUPERFICIAL FEMORAL ARTERY;  Surgeon: [name], MD;  Location: [place];  Service: Vascular;  Laterality: Right;   LEFT HEART CATH AND CORONARY ANGIOGRAPHY N/A 07/30/2016   Procedure: Left Heart Cath and Coronary Angiography;  Surgeon: [name], MD;  Location: [place] [facility];  Service: Cardiovascular;  Laterality: N/A;   LEFT HEART CATH AND CORONARY ANGIOGRAPHY N/A 01/19/2018   Procedure: LEFT HEART CATH AND CORONARY ANGIOGRAPHY;  Surgeon: [name], MD;  Location: [place] [facility];  Service: Cardiovascular;  Laterality: N/A;   LEFT HEART CATH AND CORONARY ANGIOGRAPHY N/A 05/24/2020   Procedure: LEFT HEART CATH AND CORONARY ANGIOGRAPHY;  Surgeon: Burnell Blanks, MD;  Location: [place] [facility];  Service: Cardiovascular;  Laterality: N/A;   LEFT HEART CATH AND CORONARY ANGIOGRAPHY N/A 08/06/2021   Procedure: LEFT HEART CATH AND CORONARY ANGIOGRAPHY;  Surgeon: Burnell Blanks, MD;  Location: [place] [facility];  Service: Cardiovascular;  Laterality: N/A;   LESION REMOVAL     Lip and hand    LOWER EXTREMITY ANGIOGRAM Right 11/22/2020   Procedure: RIGHT LEG ANGIOGRAM;  Surgeon: [name], MD;  Location: [facility];  Service: Vascular;  Laterality: Right;   LOWER EXTREMITY ANGIOGRAM Right 08/15/2021   Procedure: RIGHT LOWER EXTREMITY ANGIOGRAM;  Surgeon: [name], MD;  Location: [facility];  Service: Vascular;  Laterality: Right;   LOWER EXTREMITY ANGIOGRAPHY N/A 04/18/2019   Procedure: LOWER EXTREMITY ANGIOGRAPHY;  Surgeon: [name], MD;  Location: [place] [facility];  Service: Cardiovascular;  Laterality: N/A;   LOWER EXTREMITY ANGIOGRAPHY N/A 08/18/2021   Procedure: Lower Extremity Angiography;  Surgeon: Waynetta Sandy, MD;  Location: [place] [facility];  Service: Cardiovascular;  Laterality: N/A;   NECK SURGERY     PERIPHERAL VASCULAR BALLOON ANGIOPLASTY  04/18/2019   Procedure: PERIPHERAL VASCULAR BALLOON ANGIOPLASTY;  Surgeon: [name], MD;  Location: [place] [facility];  Service: Cardiovascular;;   PERIPHERAL VASCULAR BALLOON ANGIOPLASTY Left 11/12/2020   Procedure: PERIPHERAL VASCULAR BALLOON ANGIOPLASTY;  Surgeon: [name], MD;  Location: [place] [facility];  Service: Cardiovascular;  Laterality: Left;  Failed PTA of superficial femoral artery.   PERIPHERAL VASCULAR BALLOON ANGIOPLASTY Right 08/18/2021   Procedure: PERIPHERAL VASCULAR BALLOON ANGIOPLASTY;  Surgeon: Waynetta Sandy, MD;  Location: [place] [facility];  Service: Cardiovascular;  Laterality: Right;  peroneal   PERIPHERAL VASCULAR INTERVENTION Right 03/21/2019   Procedure: PERIPHERAL VASCULAR INTERVENTION;  Surgeon: [name], MD;  Location: [facility];  Service: Cardiovascular;  Laterality: Right;  superficial femoral   PERIPHERAL VASCULAR INTERVENTION Right 08/18/2021   Procedure: PERIPHERAL VASCULAR INTERVENTION;  Surgeon: Waynetta Sandy, MD;  Location: [facility];  Service: Cardiovascular;  Laterality: Right;  tibial peroneal trunk   TEE WITHOUT CARDIOVERSION N/A 12/23/2021   Procedure: TRANSESOPHAGEAL ECHOCARDIOGRAM (TEE);  Surgeon: Elouise Munroe, MD;  Location: [facility];  Service: Cardiology;  Laterality: N/A;    Current Outpatient Medications  Medication Sig Dispense Refill   albuterol (VENTOLIN HFA) 108 (90 Base) MCG/ACT inhaler Inhale 2 puffs into the  lungs every 6 (six) hours as needed for wheezing or shortness of breath. 8.5 g 0   Calcium Carbonate Antacid (ALKA-SELTZER ANTACID PO) Take 1 tablet by mouth 2 (two) times daily as needed (heartburn, indigestion).     clopidogrel (PLAVIX) 75 MG tablet TAKE 1 TABLET BY MOUTH DAILY (Patient taking differently: Take 75  mg by mouth daily.) 30 tablet 10   DULoxetine (CYMBALTA) 30 MG capsule Take 3 capsules (90 mg total) by mouth daily. (Patient taking differently: Take 90 mg by mouth at bedtime.) 120 capsule 1   ELIQUIS 5 MG TABS tablet TAKE ONE (1) TABLET BY MOUTH TWICE DAILY (Patient taking differently: Take 5 mg by mouth 2 (two) times daily.) 60 tablet 2   ezetimibe (ZETIA) 10 MG tablet Take 1 tablet (10 mg total) by mouth daily. 90 tablet 3   fenofibrate 160 MG tablet TAKE 1 TABLET BY MOUTH DAILY 30 tablet 0   furosemide (LASIX) 20 MG tablet Take 20 mg by mouth as needed.     gabapentin (NEURONTIN) 300 MG capsule TAKE 1 CAPSULE BY MOUTH 3 TIMES DAILY 90 capsule 10   HUMULIN 70/30 (70-30) 100 UNIT/ML injection INJECT 60 UNITS SUBCUTANEOUSLY TWICE DAILY WITH MEALS 30 mL 0   HYDROcodone-acetaminophen (NORCO) 10-325 MG tablet Take 1 tablet by mouth every 4 (four) hours as needed for moderate pain. 30 tablet 0   insulin degludec (TRESIBA FLEXTOUCH) 200 UNIT/ML FlexTouch Pen Inject 16 Units into the skin at bedtime. 9 mL 2   insulin lispro (HUMALOG) 100 UNIT/ML KwikPen INJECT 15-20 UNITS SUBCUTANEOUSLY THREE TIMES A DAY WITH MEALS (Patient taking differently: 15-20 Units 3 (three) times daily. Sliding scale) 15 mL 10   isosorbide mononitrate (IMDUR) 30 MG 24 hr tablet TAKE 1 TABLET BY MOUTH DAILY (Patient taking differently: Take 30 mg by mouth daily.) 30 tablet 10   Menthol, Topical Analgesic, (BLUE-EMU MAXIMUM STRENGTH EX) Apply 1 application  topically daily as needed (pain).     metoprolol succinate (TOPROL-XL) 50 MG 24 hr tablet Take 1 tablet by mouth once daily 90 tablet 1   metoprolol tartrate (LOPRESSOR) 50 MG tablet Take 50 mg by mouth as needed.     nitroGLYCERIN (NITROSTAT) 0.4 MG SL tablet DISSOLVE ONE TABLET UNDER THE TONGUE EVERY 5 MINUTES AS NEEDED FOR CHEST PAIN.  DO NOT EXCEED A TOTAL OF 3 DOSES IN 15 MINUTES Strength: 0.4 mg (Patient taking differently: Place 0.4 mg under the tongue every 5 (five)  minutes x 3 doses as needed for chest pain.) 25 tablet 0   pantoprazole (PROTONIX) 40 MG tablet Take 1 tablet (40 mg total) by mouth 2 (two) times daily. (Patient taking differently: Take 40 mg by mouth daily.) 60 tablet 0   tirzepatide (MOUNJARO) 10 MG/0.5ML Pen Inject 10 mg into the skin once a week. 6 mL 2   TRUEPLUS INSULIN SYRINGE 28G X 1/2" 1 ML MISC USE TO GIVE INSULIN FIVE TIMES DAILY     amoxicillin-clavulanate (AUGMENTIN) 875-125 MG tablet Take 1 tablet by mouth every 12 (twelve) hours. if fever >100.67F (Patient not taking: Reported on 03/16/2022) 10 tablet 0   No current facility-administered medications for this visit.    Allergies as of 03/19/2022 - Review Complete 03/19/2022  Allergen Reaction Noted   Shellfish allergy Anaphylaxis, Hives, and Swelling 09/06/2014   Sulfa antibiotics Anaphylaxis, Hives, and Swelling 09/06/2014   Ace inhibitors Other (See Comments) and Cough 08/09/2013   Invokana [canagliflozin] Other (See Comments) 09/04/2013   Lexapro [escitalopram] Other (See Comments)  01/13/2016   Metformin and related Itching 07/12/2013   Pravachol [pravastatin sodium] Other (See Comments) 11/14/2014   Repatha [evolocumab] Other (See Comments) 01/04/2017   Tricor [fenofibrate] Other (See Comments) 11/14/2015   Zestril [lisinopril] Cough 10/04/2014   Crestor [rosuvastatin] Other (See Comments) 06/05/2013   Horse-derived products Rash 07/25/2008   Lipitor [atorvastatin] Other (See Comments) 06/05/2013   Livalo [pitavastatin] Other (See Comments) 06/25/2016   Milk (cow) Diarrhea and Nausea Only 04/06/2019   Tape Rash 07/25/2008    Vitals: BP 96/63 (BP Location: Left Arm, Patient Position: Sitting, Cuff Size: Large) Comment (Cuff Size): large  Pulse 85   Ht '6\' 3"'$  (1.905 m)   Wt 280 lb 6.4 oz (127.2 kg)   BMI 35.05 kg/m  Last Weight:  Wt Readings from Last 1 Encounters:  03/19/22 280 lb 6.4 oz (127.2 kg)   Last Height:   Ht Readings from Last 1 Encounters:   03/19/22 '6\' 3"'$  (1.905 m)  Exam: NAD, pleasant                  Speech:    Speech is normal; fluent and spontaneous with normal comprehension.  Cognition:    03/19/2022    8:34 AM 12/22/2017    8:51 AM 12/22/2017    8:44 AM  MMSE - Mini Mental State Exam  Orientation to time '4 5 5  '$ Orientation to Place '4 5 5  '$ Registration 3 3   Attention/ Calculation 5 5   Recall 2 3   Language- name 2 objects 2 2   Language- repeat 1 1   Language- follow 3 step command 3 3   Language- read & follow direction 1 1   Write a sentence 1 1   Copy design 1 1   Total score 27 30        The patient is oriented to person, place, and time;     recent and remote memory intact;     language fluent;    Cranial Nerves:    The pupils are equal, round, and reactive to light.Trigeminal sensation is intact and the muscles of mastication are normal. The face is symmetric. The palate elevates in the midline. Hearing intact. Voice is normal. Shoulder shrug is normal. The tongue has normal motion without fasciculations.   Coordination:  No dysmetria  Motor Observation:    No asymmetry, no atrophy, and no involuntary movements noted. Tone:    Normal muscle tone.     Strength:    Strength is equal and antigravity, no focal deficits noted        Sensation: decreased distally in LE in all sensory modalities.   DTRs: Absent AJs   Assessment/Plan: Absolutely delightful gentleman with his lovely wife here again for TIA and memory loss after multiple recent hospitalizations for sepsis, we have seen him before for multiple complaints here for follow up after TIA in the setting of stopping his Eliquis for surgery. Extensive PMHx, and has been complaining of memory loss for several years to me   He has a past medical history of untreated sleep apnea, morbid obesity, B12 deficiency on supplementation, syncope, depression, intolerance to statin, leg ulcers status post popliteal bypass graft and femoral artery stenting.   Also cerebrovascular disease, headaches, chronic kidney disease, peripheral vascular disease, myocardial infarction status post stent July 30, 2016 follows with cardiology and is in a clinical trial for hyperlipidemia, followed by pain management every 2 to 3 months, chronic kidney disease followed by nephrology, followed by  vascular and had stent placement in his right leg,, medical noncompliance, hypertension, hyperlipidemia, diabetes, depression, coronary artery disease, arthritis, anxiety, cerebrovascular disease, congestive heart failure, A. fib, diabetic peripheral neuropathy, lacunar infarct, degenerative disc disease, drug-induced myopathy, obesity, former smoker with greater than 30-pack-year history.  He declines to wear CPAP.  We have evaluated him in the past for multiple other disorders including cerebrovascular disease, lacunar strokes, noncompliance with CPAP and untreated obstructive sleep apnea with morning headaches.syncope an pre-syncope in the setting of hypotension as low as 80/56 improved with management of blood pressure medications last episodes in June. Today he feels his memory is worse but he has had multiple medical problems and admissions with sepsis  - Memory loss: I don;t appreciate neurodegenerative disease possible MCI due to medical conditions, untreated sleep apnea but given his extensive medical history and FHx of alzheimers and his repeated requests for testing will accommodate, likely multifactorial including medications, morbid obesity, sedentary lifestyle, untreated sleep apnea, sepsis and multiple medical conditions. He has a family history of dementia and has expressed concerns to me for several years. At this time I will refer him to formal neurocognitive testing for baseline.  - Chronic diffuse cerebrovascular disease would avoid hypotension goal >924 systolic. Patient's prior MRA head and neck was concerning for new left ICA high-grade stenosis in the neck with  downstream MCA decreased flow(not consistent with his left-sided numbness and TIA).  Dr. Erlinda Hong recommend CTA head and neck for further evaluation of cerebral vasculature outpatient. Even if I could convince him to get a CTA, at this point unclear if Dr. [name] would even consider intervention, reached out to him. Patient likely won't agree to to CTA or [place] he states. But no significant chronic microvascular disease on recent MRI, so less likely vascular dementia   -syncope in the past: Patient with orthostatic hypotension discussed causes and treatments: discussed (see below). Needs continues follow up with primary care for medication management.   - He has untreated sleep apnea and declines being treated, We spent an extended amount of time discussing it again today. He has said in the past he will accept the consequences of his untreated sleep apnea and morbid obesity and lifestyle and I told him these may be some of the consequences but not all, increased risk of stroke and dementia and others. He still feels the same,  - Discussed weight loss again. Still declines referral to [facility] again today  - manage diabetes closely, hga1c  goal is <7),   -  cholesterol discussed ldl goal < 70, on a clinical trial for his cholesterol - continue plavix and eliquis for stroke prevention - Afib: continue eliquis   Stroke an dvascular risk factors: I had a long d/w patient about his stroke risk, risk for recurrent stroke/TIAs, personally independently reviewed imaging studies and stroke evaluation results and answered questions.Continue Plavix and Eliquis for secondary stroke prevention and maintain strict control of hypertension with blood pressure goal below 130/90, diabetes with hemoglobin A1c goal below 6.5% and lipids with LDL cholesterol goal below 70 mg/dL I also advised the patient to eat a healthy diet with plenty of whole grains, cereals, fruits and vegetables, exercise regularly  and maintain ideal body weight .Followup in the future with me in  6 months or call earlier if necessary. With diffuse cerebrovascular disease would avoid hypotension. Discussed smoking cessation and obesity. Highly encouraged him to manage his vascular risk factors which are extensive. He is in a trial  for his hyperlipidemia.   Discussed: Non-Drug Treatment for Low Blood Pressure on Standing in the past Orthostatic hypotension: 1. Changing Postures:  Change posture slowly when getting up, especially in the morning  Hold on to something during the first few minutes after standing up. Do not start walking as soon as you get up from the chair  Avoid prolonged recumbency or lying down  Raise the head of the bed by 10 to 20 degrees  2. Exercise:  Perform Isotonic exercise, e.g.recumbent bike, pedaling movements while sitting in a chair  Avoid exercises where you have to strain   3. Avoid Pooling of blood in legs:  Wear custom-fitted elastic stockings. The ones which extend to the abdomen work even better. Consider wearing an abdominal binder.  Perform physical counter-maneuvers, such as crossing legs and tensing leg muscles.  4. Eating and Drinking:  Small meals are recommended. Avoid large meals. Avoid standing suddenly after a large meal  Avoid alcohol  Increase intake of fluids and regular salt. A daily intake of up to 10 grams of sodium per day and a fluid intake of 2.0 to 2.5 liters per day (8 to 10 glasses of water) is recommended.   Rapid (over 3 minutes) ingestion of approximately 0.5 liter (2 glasses of water) of tap water, raises blood pressure within 5 to 15 minutes and lasts for an hour.  5. Other Tips:  Avoid hot baths. Instead take warm baths  Maintain a BP record standing and lying down  If you are only any BP lowering drugs (antihypertensives, diuretics, antidepressants, drugs for prostate, etc) ask your doctor to revisit the need to keep you on these drugs  If all non-drug  therapy fails, ask your doctor about drug therapy   Will followup after formal memory testing. Discussed lecanumab, they decline genetic testing ot pet amyloid  Check B12  Orders Placed This Encounter  Procedures   B12 and Folate Panel   Methylmalonic acid, serum   Ambulatory referral to Neuropsychology      Sarina Ill, MD  [facility] [address] [place], [place] [address]  Phone [phone] Fax [phone]   I spent over minutes of face-to-face and non-face-to-face time with patient on the  1. B12 deficiency   2. Memory loss    diagnosis.  This included previsit chart review, lab review, study review, order entry, electronic health record documentation, patient education on the different diagnostic and therapeutic options, counseling and coordination of care, risks and benefits of management, compliance, or risk factor reduction

## 2022-03-19 NOTE — Telephone Encounter (Signed)
Referral sent to Dr. Sima Matas, phone # [phone].

## 2022-03-21 LAB — LIPID PANEL W/O CHOL/HDL RATIO
Cholesterol, Total: 132 mg/dL (ref 100–199)
HDL: 25 mg/dL — ABNORMAL LOW (ref >39)
LDL Chol Calc (NIH): 82 mg/dL (ref 0–99)
Triglycerides: 141 mg/dL (ref 0–149)
VLDL Cholesterol Cal: 25 mg/dL (ref 5–40)

## 2022-03-21 LAB — SPECIMEN STATUS REPORT

## 2022-03-23 DIAGNOSIS — I480 Paroxysmal atrial fibrillation: Secondary | ICD-10-CM | POA: Diagnosis not present

## 2022-03-23 DIAGNOSIS — I87312 Chronic venous hypertension (idiopathic) with ulcer of left lower extremity: Secondary | ICD-10-CM | POA: Diagnosis not present

## 2022-03-23 DIAGNOSIS — I70244 Atherosclerosis of native arteries of left leg with ulceration of heel and midfoot: Secondary | ICD-10-CM | POA: Diagnosis not present

## 2022-03-23 DIAGNOSIS — L97821 Non-pressure chronic ulcer of other part of left lower leg limited to breakdown of skin: Secondary | ICD-10-CM | POA: Diagnosis not present

## 2022-03-23 DIAGNOSIS — L97522 Non-pressure chronic ulcer of other part of left foot with fat layer exposed: Secondary | ICD-10-CM | POA: Diagnosis not present

## 2022-03-23 DIAGNOSIS — I50813 Acute on chronic right heart failure: Secondary | ICD-10-CM | POA: Diagnosis not present

## 2022-03-23 DIAGNOSIS — E1140 Type 2 diabetes mellitus with diabetic neuropathy, unspecified: Secondary | ICD-10-CM | POA: Diagnosis not present

## 2022-03-23 DIAGNOSIS — I739 Peripheral vascular disease, unspecified: Secondary | ICD-10-CM | POA: Diagnosis not present

## 2022-03-23 DIAGNOSIS — I2510 Atherosclerotic heart disease of native coronary artery without angina pectoris: Secondary | ICD-10-CM | POA: Diagnosis not present

## 2022-03-23 DIAGNOSIS — Z7901 Long term (current) use of anticoagulants: Secondary | ICD-10-CM | POA: Diagnosis not present

## 2022-03-23 LAB — B12 AND FOLATE PANEL
Folate: >20 ng/mL (ref >3.0)
Vitamin B-12: 556 pg/mL (ref 232–1245)

## 2022-03-23 LAB — METHYLMALONIC ACID, SERUM: Methylmalonic Acid: 470 nmol/L — ABNORMAL HIGH (ref 0–378)

## 2022-03-27 DIAGNOSIS — I872 Venous insufficiency (chronic) (peripheral): Secondary | ICD-10-CM | POA: Diagnosis not present

## 2022-03-27 DIAGNOSIS — I70203 Unspecified atherosclerosis of native arteries of extremities, bilateral legs: Secondary | ICD-10-CM | POA: Diagnosis not present

## 2022-03-31 ENCOUNTER — Telehealth: Payer: Self-pay | Admitting: Family

## 2022-03-31 ENCOUNTER — Other Ambulatory Visit: Payer: Self-pay | Admitting: Cardiovascular Disease

## 2022-03-31 ENCOUNTER — Other Ambulatory Visit: Payer: Self-pay | Admitting: Family

## 2022-03-31 ENCOUNTER — Encounter: Payer: Self-pay | Admitting: *Deleted

## 2022-03-31 DIAGNOSIS — I480 Paroxysmal atrial fibrillation: Secondary | ICD-10-CM

## 2022-03-31 DIAGNOSIS — E1169 Type 2 diabetes mellitus with other specified complication: Secondary | ICD-10-CM

## 2022-03-31 MED ORDER — GABAPENTIN 300 MG PO CAPS: 900.0000 mg | ORAL_CAPSULE | Freq: Two times a day (BID) | ORAL | 6 refills | Status: DC

## 2022-03-31 NOTE — Telephone Encounter (Signed)
Patient aware and verbalized understanding. °

## 2022-03-31 NOTE — Telephone Encounter (Signed)
Prescription sent to pharmacy.

## 2022-03-31 NOTE — Telephone Encounter (Signed)
Patient takes 3 Gabapentin in the morning and 3 in the afternoon. Needs a RX called into [facility]. Please call when done. The dosage now states he takes one capsule three times a day and he is running short  Please advise on the above message from the pt and if appropriate send in new Rx

## 2022-04-01 ENCOUNTER — Other Ambulatory Visit: Payer: Self-pay | Admitting: Cardiovascular Disease

## 2022-04-01 DIAGNOSIS — I87312 Chronic venous hypertension (idiopathic) with ulcer of left lower extremity: Secondary | ICD-10-CM | POA: Diagnosis not present

## 2022-04-01 DIAGNOSIS — E1140 Type 2 diabetes mellitus with diabetic neuropathy, unspecified: Secondary | ICD-10-CM | POA: Diagnosis not present

## 2022-04-01 DIAGNOSIS — L97821 Non-pressure chronic ulcer of other part of left lower leg limited to breakdown of skin: Secondary | ICD-10-CM | POA: Diagnosis not present

## 2022-04-01 DIAGNOSIS — I739 Peripheral vascular disease, unspecified: Secondary | ICD-10-CM | POA: Diagnosis not present

## 2022-04-01 DIAGNOSIS — Z7901 Long term (current) use of anticoagulants: Secondary | ICD-10-CM | POA: Diagnosis not present

## 2022-04-01 DIAGNOSIS — I2510 Atherosclerotic heart disease of native coronary artery without angina pectoris: Secondary | ICD-10-CM | POA: Diagnosis not present

## 2022-04-01 DIAGNOSIS — I70244 Atherosclerosis of native arteries of left leg with ulceration of heel and midfoot: Secondary | ICD-10-CM | POA: Diagnosis not present

## 2022-04-01 DIAGNOSIS — L97522 Non-pressure chronic ulcer of other part of left foot with fat layer exposed: Secondary | ICD-10-CM | POA: Diagnosis not present

## 2022-04-01 DIAGNOSIS — I50813 Acute on chronic right heart failure: Secondary | ICD-10-CM | POA: Diagnosis not present

## 2022-04-01 DIAGNOSIS — I480 Paroxysmal atrial fibrillation: Secondary | ICD-10-CM | POA: Diagnosis not present

## 2022-04-01 NOTE — Telephone Encounter (Signed)
This is a [facility] pt.  °

## 2022-04-03 ENCOUNTER — Ambulatory Visit: Payer: Self-pay | Admitting: *Deleted

## 2022-04-03 ENCOUNTER — Encounter: Payer: Self-pay | Admitting: *Deleted

## 2022-04-03 NOTE — Patient Outreach (Addendum)
  Care Coordination   Follow Up Visit Note   04/03/2022 Name: [name] [name] MRN: [id number] DOB: [birth year]  [name] [name] is a 71 y.o. year old male who sees [place], Theador Hawthorne, FNP for primary care. I spoke with  [name] [name] by phone today.  What matters to the patients health and wellness today?  Diabetes He has increased the Mounjaro up 10 mg more. He now has a poor appetite  Mrs [name] voiced interest in a new cook book to offer patient foods he prefers   Atrial fibrillation- had a hour long episode this week  He reports an episode of atrial fibrillation in past that lasted 8 hours  He mainly gets short on breath   Diabetes cbg is doing better   Weighing He reports he steady losing weight to MD visits Does have scales and is now weighing more. [name] confirmed this  GI symptoms this week Got nausea and had an emesis and felt better   Has a referral to Dr Sima Matas, neuropsychology He is not preferring this appointment right now as he reports he is going to many doctors He is interested in getting his appointments scheduled less   Goals Addressed               This Visit's Progress     Patient Stated     Fall [facility]) (pt-stated)   On track     Care Coordination Interventions: Provided written and verbal education re: potential causes of falls and Fall prevention strategies Advised patient of importance of notifying provider of falls Assessed for falls since last encounter Updated his wife on the outreach to his pcp  Confirms he has not had any other falls and is healing       improved uncontrolled diabetes ([facility]) (pt-stated)        Care Coordination Interventions: Provided education to patient about basic DM disease process Reviewed medications with patient and discussed importance of medication adherence Discussed plans with patient for ongoing care management follow up and provided patient with direct contact information for care management team Discussed the  importance of providing him food he prefers to improve his appetite Confirmed he does not see an endocrinologist       manage atrial fibrillation ([facility]) (pt-stated)   Not on track     Care Coordination Interventions: Reviewed importance of adherence to anticoagulant exactly as prescribed Afib action plan reviewed Screening for signs and symptoms of depression related to chronic disease state  Assessed social determinant of health barriers Assessed for symptoms of afib, Confirmed an episode that lasted an hour this week Discuss some methods suggested to slow the episode down        SDOH assessments and interventions completed:  No     Care Coordination Interventions Activated:  Yes  Care Coordination Interventions:  Yes, provided   Follow up plan: Follow up call scheduled for 05/07/22 1030    Encounter Outcome:  Pt. Visit Completed   Dayanne Yiu L. Lavina Hamman, RN, BSN, [place] Coordinator Office number [phone]

## 2022-04-07 ENCOUNTER — Other Ambulatory Visit: Payer: Self-pay | Admitting: Family Medicine

## 2022-04-07 ENCOUNTER — Telehealth: Payer: Self-pay | Admitting: Family

## 2022-04-07 MED ORDER — GABAPENTIN 300 MG PO CAPS: 900.0000 mg | ORAL_CAPSULE | Freq: Two times a day (BID) | ORAL | 6 refills | Status: DC

## 2022-04-07 NOTE — Telephone Encounter (Signed)
I have refaxed request again and called [facility] and LM regarding getting addressed

## 2022-04-07 NOTE — Telephone Encounter (Signed)
Patient's wife calling because she was told by the pharmacy that when they received the prescription for gabapentin (NEURONTIN) 300 MG capsule, they did not have the updated directions for the patient to start taking a higher dosage. Please resend

## 2022-04-07 NOTE — Telephone Encounter (Signed)
Is he taking 900 mg BID? I sent his rx.

## 2022-04-07 NOTE — Telephone Encounter (Signed)
Patient aware and verbalized understanding. Changed to exact pharmacy

## 2022-04-08 DIAGNOSIS — I87312 Chronic venous hypertension (idiopathic) with ulcer of left lower extremity: Secondary | ICD-10-CM | POA: Diagnosis not present

## 2022-04-08 DIAGNOSIS — I70244 Atherosclerosis of native arteries of left leg with ulceration of heel and midfoot: Secondary | ICD-10-CM | POA: Diagnosis not present

## 2022-04-08 DIAGNOSIS — I739 Peripheral vascular disease, unspecified: Secondary | ICD-10-CM | POA: Diagnosis not present

## 2022-04-08 DIAGNOSIS — L97821 Non-pressure chronic ulcer of other part of left lower leg limited to breakdown of skin: Secondary | ICD-10-CM | POA: Diagnosis not present

## 2022-04-08 DIAGNOSIS — I2510 Atherosclerotic heart disease of native coronary artery without angina pectoris: Secondary | ICD-10-CM | POA: Diagnosis not present

## 2022-04-08 DIAGNOSIS — Z7901 Long term (current) use of anticoagulants: Secondary | ICD-10-CM | POA: Diagnosis not present

## 2022-04-08 DIAGNOSIS — L97522 Non-pressure chronic ulcer of other part of left foot with fat layer exposed: Secondary | ICD-10-CM | POA: Diagnosis not present

## 2022-04-08 DIAGNOSIS — I480 Paroxysmal atrial fibrillation: Secondary | ICD-10-CM | POA: Diagnosis not present

## 2022-04-08 DIAGNOSIS — I50813 Acute on chronic right heart failure: Secondary | ICD-10-CM | POA: Diagnosis not present

## 2022-04-08 DIAGNOSIS — E1140 Type 2 diabetes mellitus with diabetic neuropathy, unspecified: Secondary | ICD-10-CM | POA: Diagnosis not present

## 2022-04-08 IMAGING — MR MR FOOT*L* WO/W CM
9 series · 40 of 40 positions shown · IV contrast (gadavist)
Comparison: X-ray 07/25/2021

CLINICAL DATA: Diffuse left foot pain for 2 months. No known
injury.

EXAM:
MRI OF THE LEFT FOREFOOT WITHOUT AND WITH CONTRAST
TECHNIQUE: Multiplanar, multisequence MR imaging of the left foot was performed
both before and after administration of intravenous contrast.
CONTRAST:  10mL GADAVIST GADOBUTROL 1 MMOL/ML IV SOLN

[Series 5: T1 · coronal · left · 3.0mm · 0.70mm/px · 7 of 48 slices shown (1 of 2)]
[im 1/48]
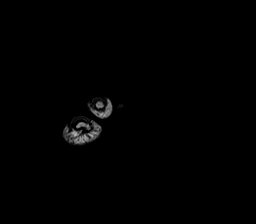
[im 8/48]
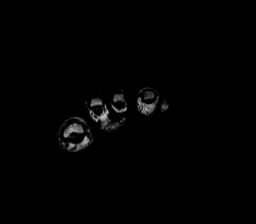
[im 16/48]
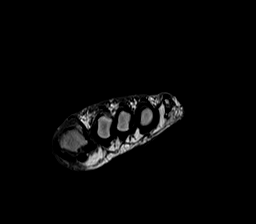
[im 24/48]
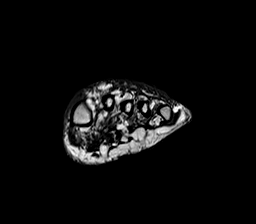
[im 32/48]
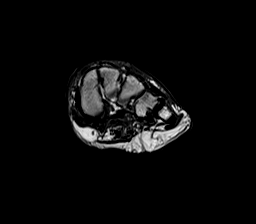
[im 40/48]
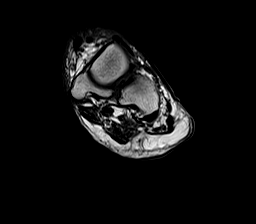
[im 48/48]
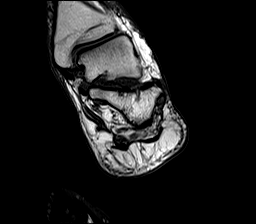

[Series 6: T2 fat-sat · coronal · left · 3.0mm · 0.38mm/px · 6 of 48 slices shown (1 of 2)]
[im 1/48]
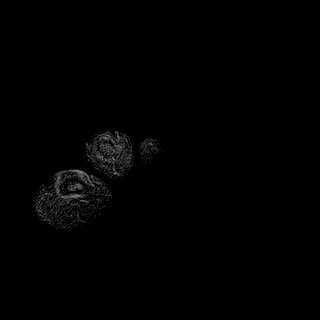
[im 10/48]
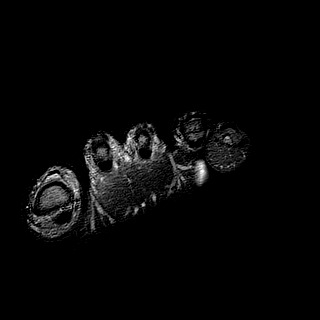
[im 19/48]
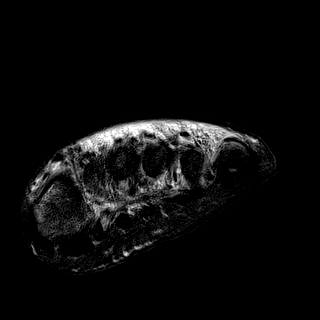
[im 29/48]
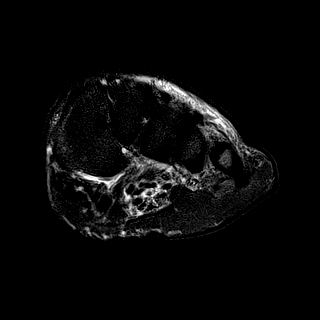
[im 38/48]
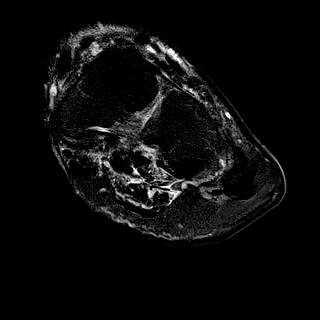
[im 48/48]
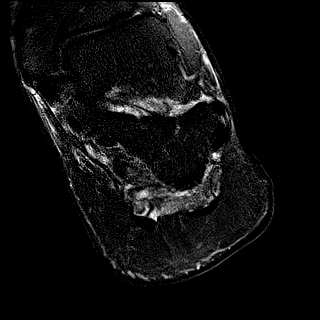

[Series 7: T1 · oblique · left · 3.0mm · 0.94mm/px · 3 of 22 slices shown (2 of 2)]
[im 1/22]
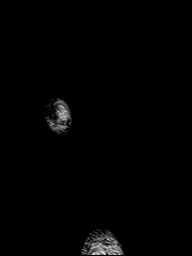
[im 11/22]
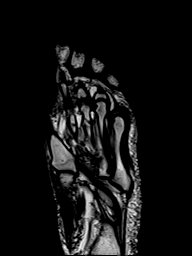
[im 22/22]
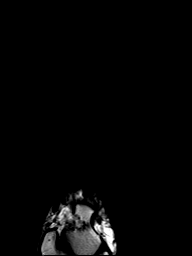

[Series 8: T2 fat-sat · oblique · left · 3.0mm · 0.94mm/px · 3 of 22 slices shown (2 of 2)]
[im 1/22]
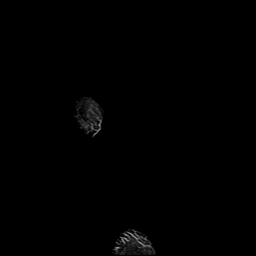
[im 11/22]
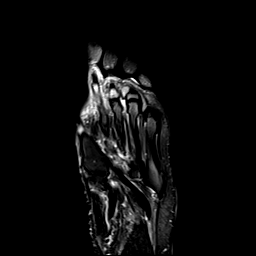
[im 22/22]
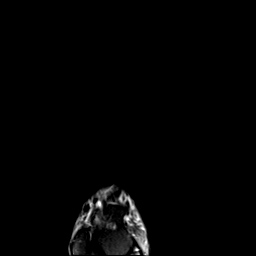

[Series 9: STIR · oblique · left · 3.0mm · 0.94mm/px · 3 of 25 slices shown]
[im 1/25]
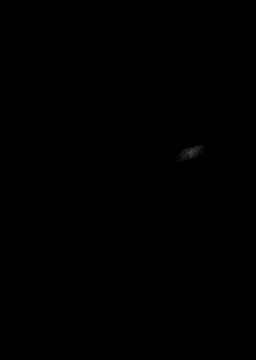
[im 13/25]
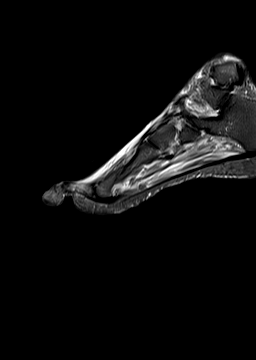
[im 25/25]
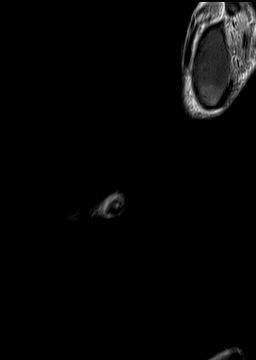

[Series 10: T1 fat-sat · coronal · non-contrast · left · 3.0mm · 0.70mm/px · 6 of 48 slices shown (1 of 3)]
[im 1/48]
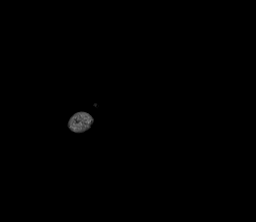
[im 10/48]
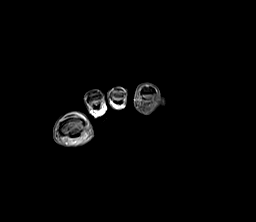
[im 19/48]
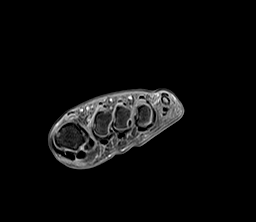
[im 29/48]
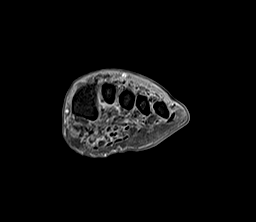
[im 38/48]
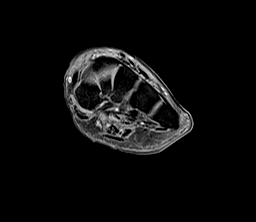
[im 48/48]
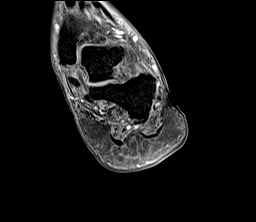

[Series 11: T1 fat-sat post-contrast · coronal · left · 3.0mm · 0.70mm/px · 6 of 48 slices shown]
[im 1/48]
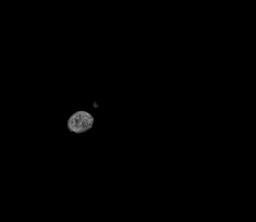
[im 10/48]
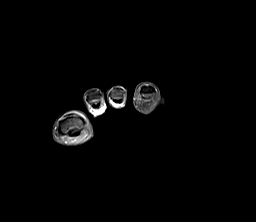
[im 19/48]
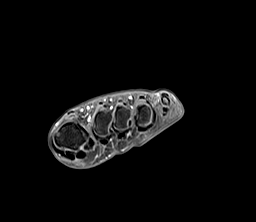
[im 29/48]
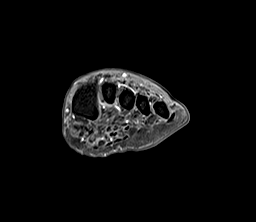
[im 38/48]
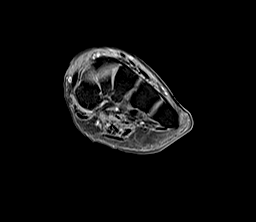
[im 48/48]
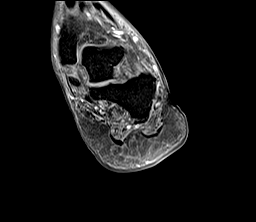

[Series 12: T1 fat-sat · oblique · left · 3.0mm · 0.78mm/px · 3 of 26 slices shown (2 of 3)]
[im 1/26]
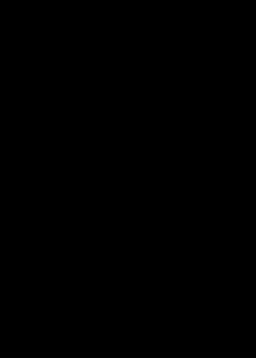
[im 13/26]
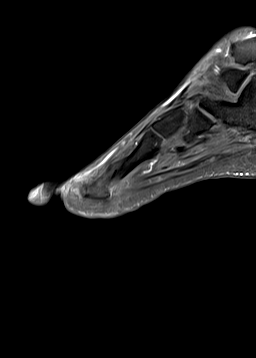
[im 26/26]
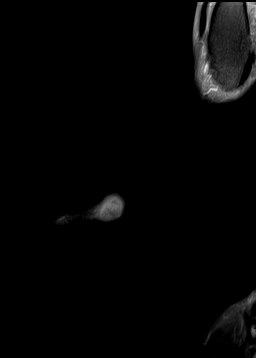

[Series 13: T1 fat-sat · oblique · left · 3.0mm · 0.94mm/px · 3 of 24 slices shown (3 of 3)]
[im 1/24]
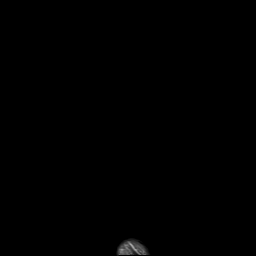
[im 12/24]
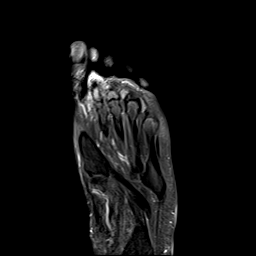
[im 24/24]
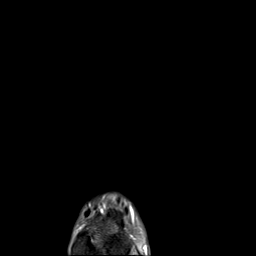

[40 of 40 positions shown; findings below may reference images not displayed]

FINDINGS: Technical Note: Despite efforts by the technologist and patient,
motion artifact is present on today's exam and could not be
eliminated. This reduces exam sensitivity and specificity.

Bones/Joint/Cartilage

No acute fracture. No dislocation. Remote healed fractures of the
second and third metatarsal diaphyses. Artifact near the edge of the
field of view results in poor fat saturation of the toes on T2
weighted images. In correlation with sister sequences. No definite
sites of bone marrow edema. No erosion or abnormal marrow
enhancement. No evidence of marrow replacement. Degenerative changes
of the foot are most pronounced at the first MTP joint. No effusion.

Ligaments

No evidence of acute ligamentous injury.

Muscles and Tendons

Denervation changes of the intrinsic foot musculature. Flexor and
extensor tendons appear intact. No tenosynovitis.

Soft tissues

Subcutaneous edema most pronounced along the dorsum of the forefoot.
No deep soft tissue ulceration. No organized fluid collection.
IMPRESSION: 1. Limited exam.
2. Subcutaneous edema most pronounced along the dorsum of the
forefoot, nonspecific but could represent cellulitis in the
appropriate clinical setting. No organized fluid collection.
3. No acute osseous abnormality. No evidence of osteomyelitis.
4. Remote healed fractures of the second and third metatarsal
diaphyses.

## 2022-04-08 IMAGING — DX DG FOOT COMPLETE 3+V*R*
3 series · 3 of 3 positions shown · non-contrast
Comparison: None.

CLINICAL DATA: Pain and swelling

EXAM:
RIGHT FOOT COMPLETE - 3+ VIEW

[foot obl (1 of 2)]
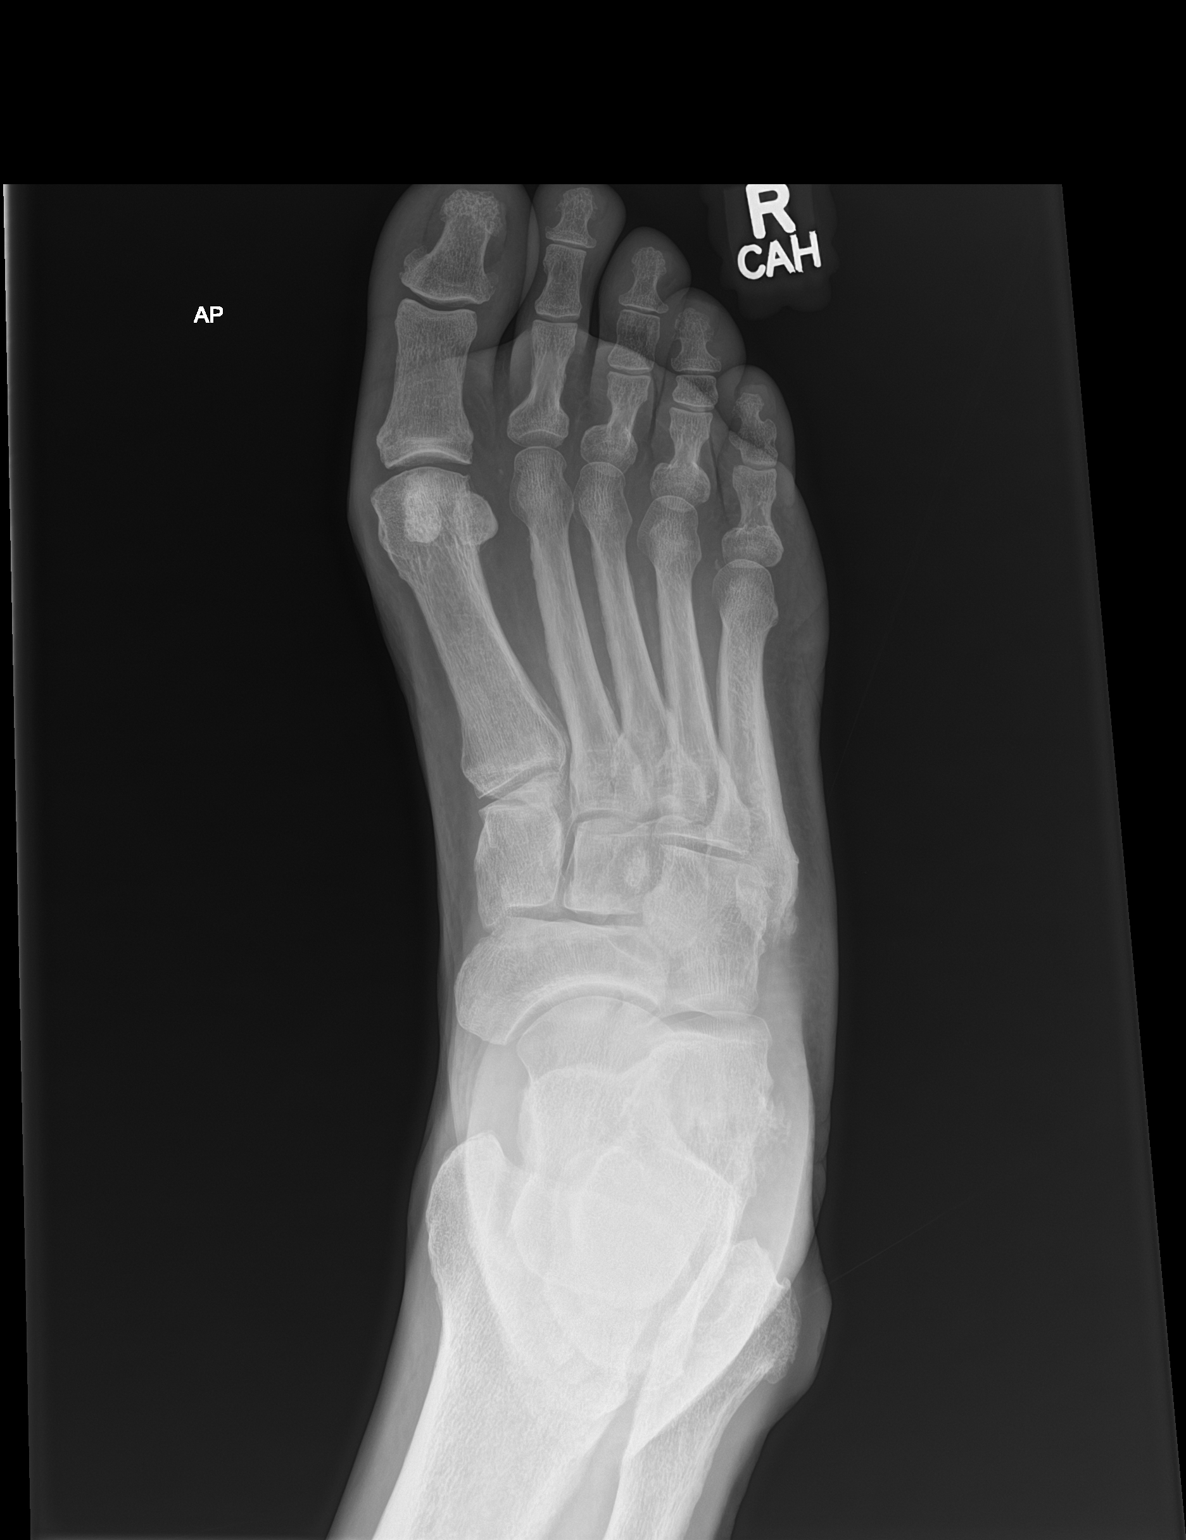

[foot lat]
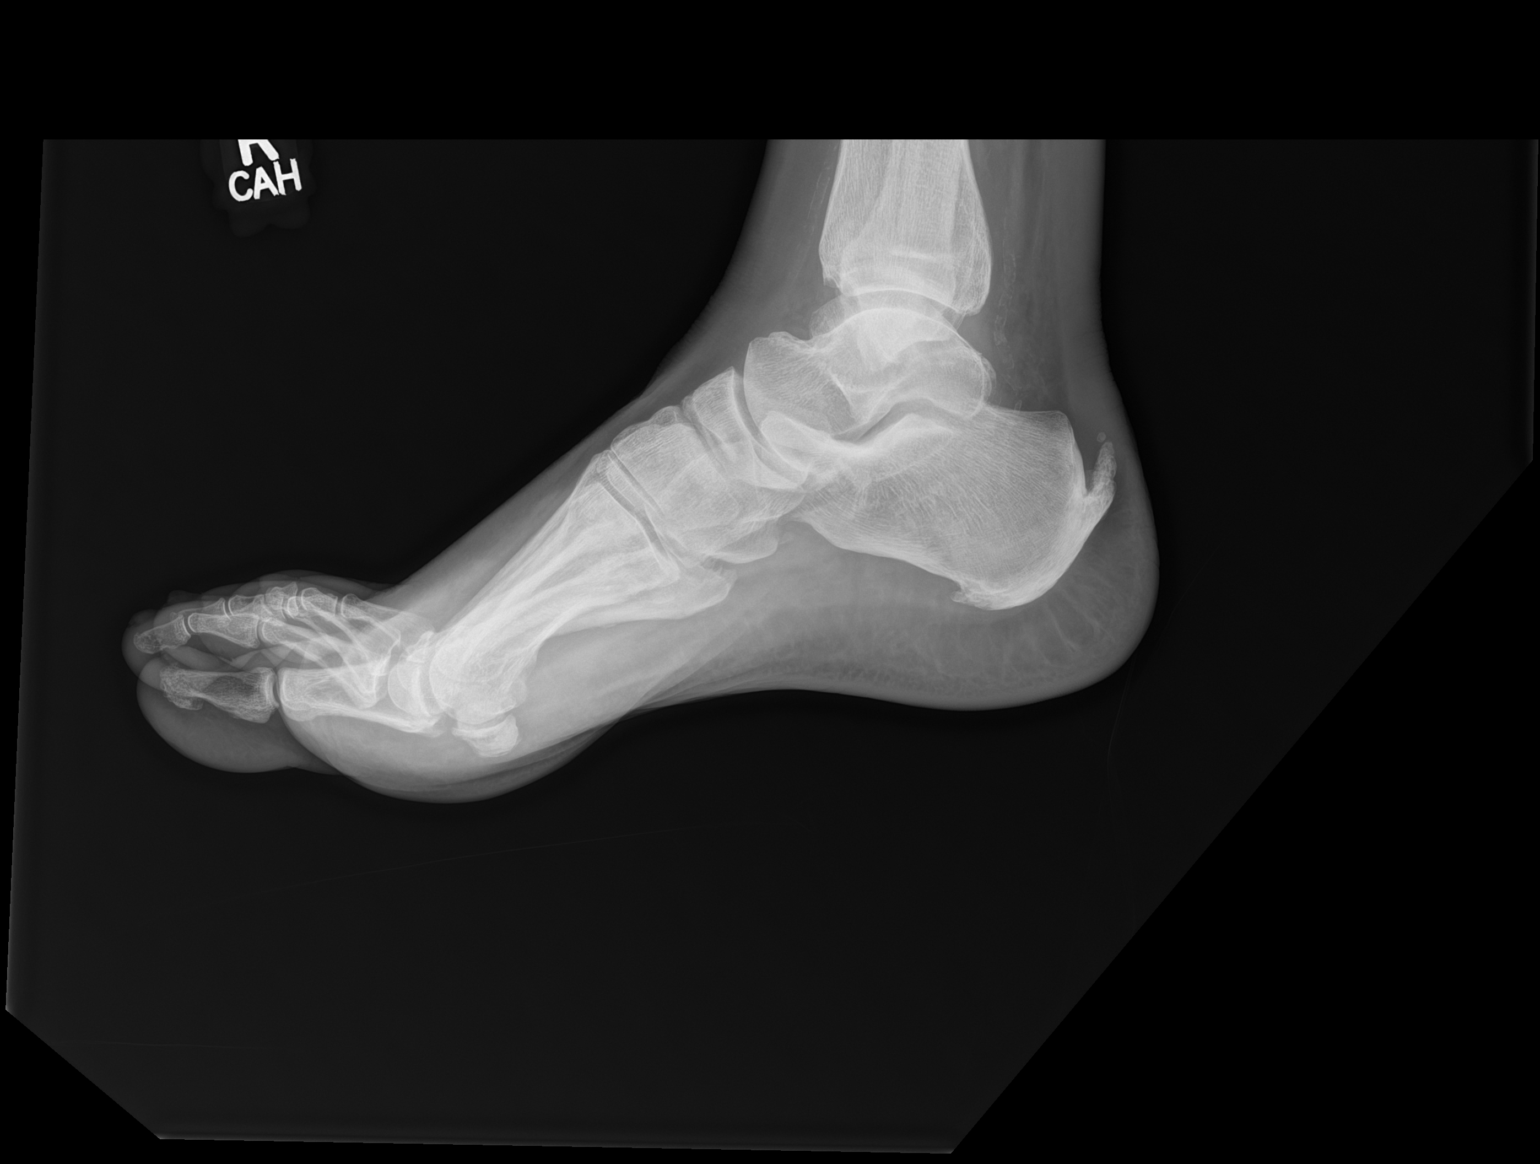

[foot obl (2 of 2)]
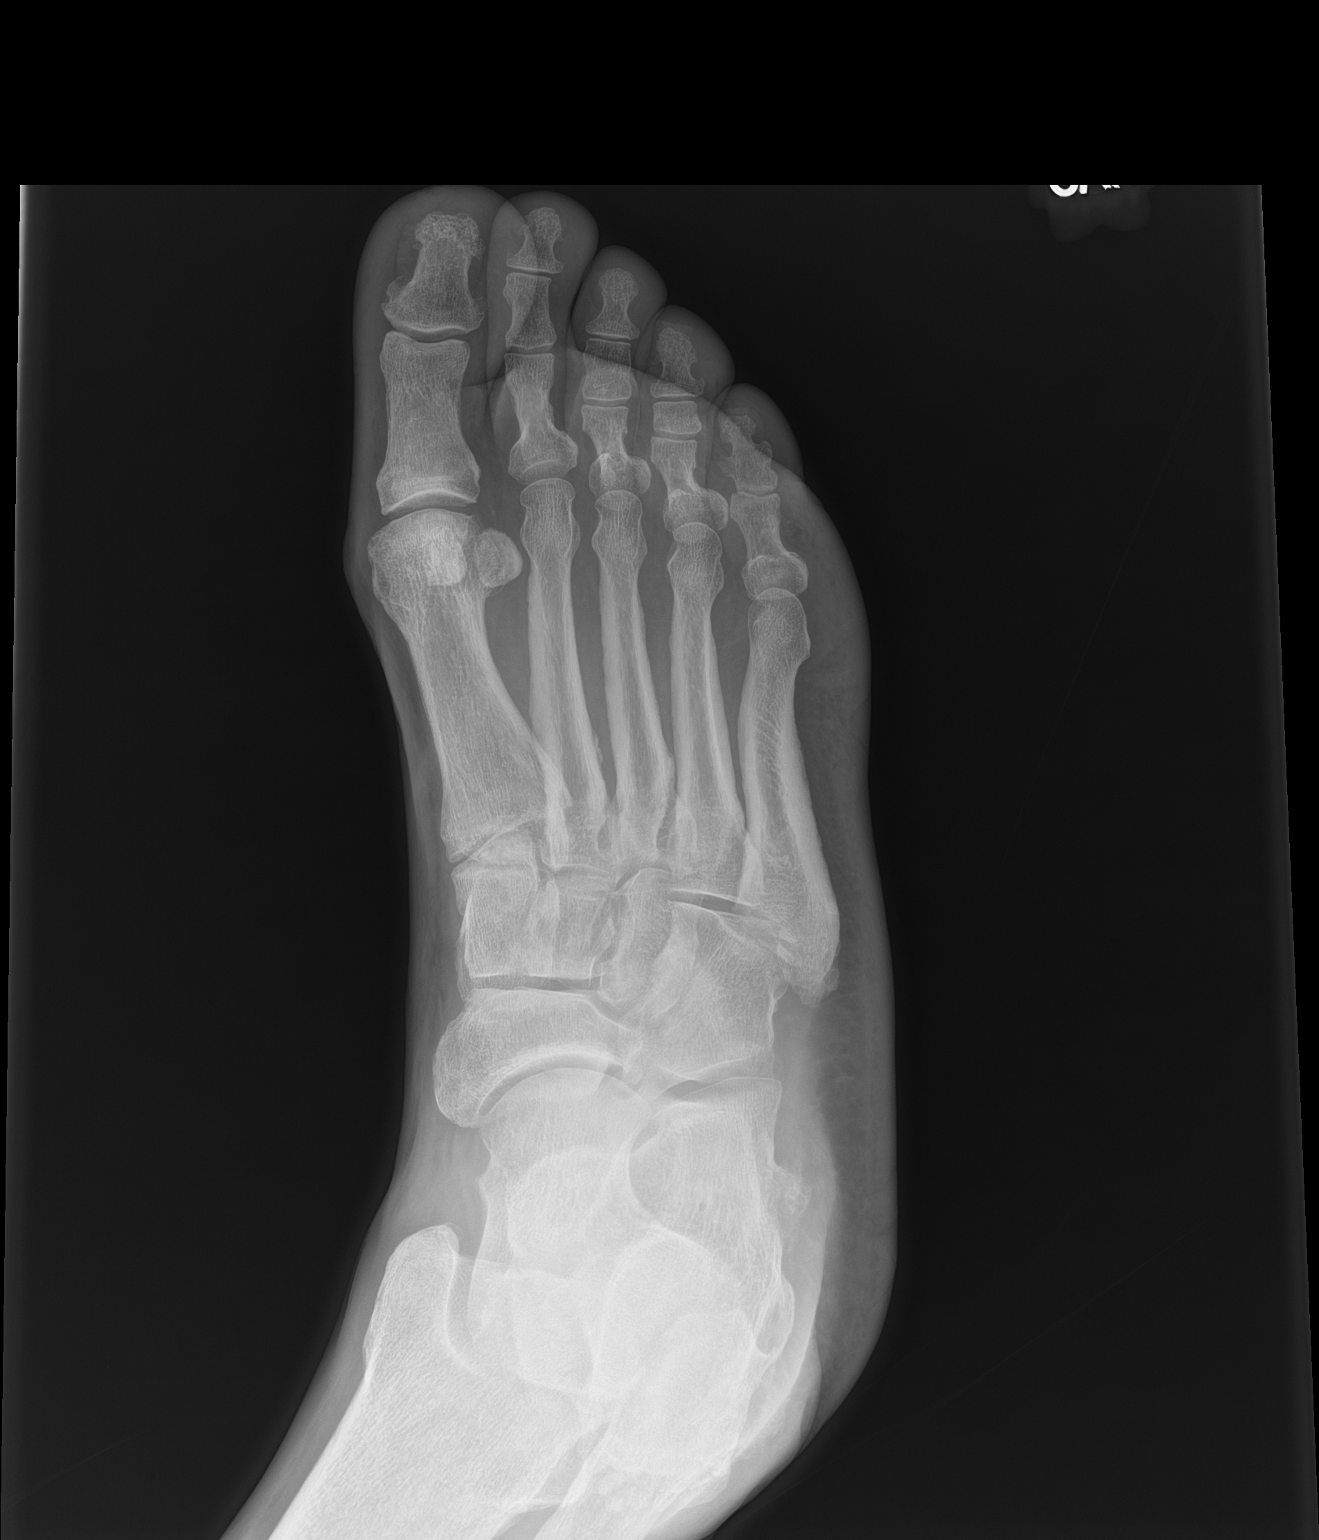

[3 of 3 positions shown; findings below may reference images not displayed]

FINDINGS: There is generalized soft tissue swelling of the foot. There is no
frank bony destruction. Plantar dorsal calcaneal spurring. Vascular
calcifications.
IMPRESSION: Soft tissue swelling.  No acute osseous abnormality.

## 2022-04-13 ENCOUNTER — Encounter: Payer: Self-pay | Admitting: Psychology

## 2022-04-13 NOTE — Telephone Encounter (Signed)
See clearance scanned in media from [facility].

## 2022-04-15 ENCOUNTER — Other Ambulatory Visit: Payer: Self-pay | Admitting: Cardiovascular Disease

## 2022-04-15 DIAGNOSIS — E1140 Type 2 diabetes mellitus with diabetic neuropathy, unspecified: Secondary | ICD-10-CM | POA: Diagnosis not present

## 2022-04-15 DIAGNOSIS — I87312 Chronic venous hypertension (idiopathic) with ulcer of left lower extremity: Secondary | ICD-10-CM | POA: Diagnosis not present

## 2022-04-15 DIAGNOSIS — I480 Paroxysmal atrial fibrillation: Secondary | ICD-10-CM | POA: Diagnosis not present

## 2022-04-15 DIAGNOSIS — I739 Peripheral vascular disease, unspecified: Secondary | ICD-10-CM | POA: Diagnosis not present

## 2022-04-15 DIAGNOSIS — I2510 Atherosclerotic heart disease of native coronary artery without angina pectoris: Secondary | ICD-10-CM | POA: Diagnosis not present

## 2022-04-15 DIAGNOSIS — I70244 Atherosclerosis of native arteries of left leg with ulceration of heel and midfoot: Secondary | ICD-10-CM | POA: Diagnosis not present

## 2022-04-15 DIAGNOSIS — I50813 Acute on chronic right heart failure: Secondary | ICD-10-CM | POA: Diagnosis not present

## 2022-04-15 DIAGNOSIS — L97821 Non-pressure chronic ulcer of other part of left lower leg limited to breakdown of skin: Secondary | ICD-10-CM | POA: Diagnosis not present

## 2022-04-15 DIAGNOSIS — L97522 Non-pressure chronic ulcer of other part of left foot with fat layer exposed: Secondary | ICD-10-CM | POA: Diagnosis not present

## 2022-04-15 DIAGNOSIS — Z7901 Long term (current) use of anticoagulants: Secondary | ICD-10-CM | POA: Diagnosis not present

## 2022-04-15 MED ORDER — PEG 3350-KCL-NA BICARB-NACL 420 G PO SOLR: 4000.0000 mL | Freq: Once | ORAL | 0 refills | Status: AC

## 2022-04-15 NOTE — Telephone Encounter (Signed)
Appropriate to hold Plavix 5 days prior. Eliquis 24 hours prior as previously mentioned.

## 2022-04-15 NOTE — Addendum Note (Signed)
Addended by: Cheron Every on: 04/15/2022 04:08 PM   Modules accepted: Orders

## 2022-04-15 NOTE — Telephone Encounter (Signed)
Spoke with pt. Scheduled for 1/8 at 7:30am. He wanted to wait until after holiday. Pt aware will send instructions/pre-op appt. Discussed meds that will be held. Rx for prep sent to pharmacy. Reports insurance will be same for next year

## 2022-04-16 ENCOUNTER — Encounter (INDEPENDENT_AMBULATORY_CARE_PROVIDER_SITE_OTHER): Payer: Self-pay | Admitting: *Deleted

## 2022-04-21 DIAGNOSIS — E1140 Type 2 diabetes mellitus with diabetic neuropathy, unspecified: Secondary | ICD-10-CM | POA: Diagnosis not present

## 2022-04-21 DIAGNOSIS — I480 Paroxysmal atrial fibrillation: Secondary | ICD-10-CM | POA: Diagnosis not present

## 2022-04-21 DIAGNOSIS — I2510 Atherosclerotic heart disease of native coronary artery without angina pectoris: Secondary | ICD-10-CM | POA: Diagnosis not present

## 2022-04-21 DIAGNOSIS — I50813 Acute on chronic right heart failure: Secondary | ICD-10-CM | POA: Diagnosis not present

## 2022-04-21 DIAGNOSIS — I87312 Chronic venous hypertension (idiopathic) with ulcer of left lower extremity: Secondary | ICD-10-CM | POA: Diagnosis not present

## 2022-04-21 DIAGNOSIS — L97522 Non-pressure chronic ulcer of other part of left foot with fat layer exposed: Secondary | ICD-10-CM | POA: Diagnosis not present

## 2022-04-21 DIAGNOSIS — I739 Peripheral vascular disease, unspecified: Secondary | ICD-10-CM | POA: Diagnosis not present

## 2022-04-21 DIAGNOSIS — Z7901 Long term (current) use of anticoagulants: Secondary | ICD-10-CM | POA: Diagnosis not present

## 2022-04-21 DIAGNOSIS — I70244 Atherosclerosis of native arteries of left leg with ulceration of heel and midfoot: Secondary | ICD-10-CM | POA: Diagnosis not present

## 2022-04-21 DIAGNOSIS — L97821 Non-pressure chronic ulcer of other part of left lower leg limited to breakdown of skin: Secondary | ICD-10-CM | POA: Diagnosis not present

## 2022-04-24 DIAGNOSIS — I480 Paroxysmal atrial fibrillation: Secondary | ICD-10-CM | POA: Diagnosis not present

## 2022-04-24 DIAGNOSIS — I70244 Atherosclerosis of native arteries of left leg with ulceration of heel and midfoot: Secondary | ICD-10-CM | POA: Diagnosis not present

## 2022-04-24 DIAGNOSIS — I50813 Acute on chronic right heart failure: Secondary | ICD-10-CM | POA: Diagnosis not present

## 2022-04-24 DIAGNOSIS — I739 Peripheral vascular disease, unspecified: Secondary | ICD-10-CM | POA: Diagnosis not present

## 2022-04-24 DIAGNOSIS — I2510 Atherosclerotic heart disease of native coronary artery without angina pectoris: Secondary | ICD-10-CM | POA: Diagnosis not present

## 2022-04-24 DIAGNOSIS — I87312 Chronic venous hypertension (idiopathic) with ulcer of left lower extremity: Secondary | ICD-10-CM | POA: Diagnosis not present

## 2022-04-24 DIAGNOSIS — L97522 Non-pressure chronic ulcer of other part of left foot with fat layer exposed: Secondary | ICD-10-CM | POA: Diagnosis not present

## 2022-04-24 DIAGNOSIS — E1140 Type 2 diabetes mellitus with diabetic neuropathy, unspecified: Secondary | ICD-10-CM | POA: Diagnosis not present

## 2022-04-24 DIAGNOSIS — Z7901 Long term (current) use of anticoagulants: Secondary | ICD-10-CM | POA: Diagnosis not present

## 2022-04-24 DIAGNOSIS — L97821 Non-pressure chronic ulcer of other part of left lower leg limited to breakdown of skin: Secondary | ICD-10-CM | POA: Diagnosis not present

## 2022-04-27 ENCOUNTER — Telehealth: Payer: Self-pay

## 2022-04-27 DIAGNOSIS — L97821 Non-pressure chronic ulcer of other part of left lower leg limited to breakdown of skin: Secondary | ICD-10-CM | POA: Diagnosis not present

## 2022-04-27 DIAGNOSIS — I739 Peripheral vascular disease, unspecified: Secondary | ICD-10-CM | POA: Diagnosis not present

## 2022-04-27 DIAGNOSIS — E1140 Type 2 diabetes mellitus with diabetic neuropathy, unspecified: Secondary | ICD-10-CM | POA: Diagnosis not present

## 2022-04-27 DIAGNOSIS — I87312 Chronic venous hypertension (idiopathic) with ulcer of left lower extremity: Secondary | ICD-10-CM | POA: Diagnosis not present

## 2022-04-27 DIAGNOSIS — I480 Paroxysmal atrial fibrillation: Secondary | ICD-10-CM | POA: Diagnosis not present

## 2022-04-27 DIAGNOSIS — I70244 Atherosclerosis of native arteries of left leg with ulceration of heel and midfoot: Secondary | ICD-10-CM | POA: Diagnosis not present

## 2022-04-27 DIAGNOSIS — I2510 Atherosclerotic heart disease of native coronary artery without angina pectoris: Secondary | ICD-10-CM | POA: Diagnosis not present

## 2022-04-27 DIAGNOSIS — L97522 Non-pressure chronic ulcer of other part of left foot with fat layer exposed: Secondary | ICD-10-CM | POA: Diagnosis not present

## 2022-04-27 DIAGNOSIS — I50813 Acute on chronic right heart failure: Secondary | ICD-10-CM | POA: Diagnosis not present

## 2022-04-27 DIAGNOSIS — Z7901 Long term (current) use of anticoagulants: Secondary | ICD-10-CM | POA: Diagnosis not present

## 2022-04-27 NOTE — Telephone Encounter (Signed)
Marita Kansas RN with [phone] [facility] called stating that she'd called last week requesting an earlier appt for the pt and hadn't been called.  Reviewed pt's chart, returned call for clarification, two identifiers used. She was at pt's house, placed call on speaker. She stated that she called on Wednesday, 12/6. Pt c/o constant pain, especially when standing, mottling of BLE, and multiple scabby sores. Pt states that the pain is different than post procedure. Appts were moved earlier d/t symptoms. Pt understands that Dr. [name] is out of the office on his vascular day until 05/25/22. Confirmed understanding.

## 2022-04-28 ENCOUNTER — Ambulatory Visit: Payer: Medicare Other | Admitting: Internal Medicine

## 2022-04-28 DIAGNOSIS — I872 Venous insufficiency (chronic) (peripheral): Secondary | ICD-10-CM | POA: Diagnosis not present

## 2022-04-28 DIAGNOSIS — I70203 Unspecified atherosclerosis of native arteries of extremities, bilateral legs: Secondary | ICD-10-CM | POA: Diagnosis not present

## 2022-04-28 DIAGNOSIS — I87319 Chronic venous hypertension (idiopathic) with ulcer of unspecified lower extremity: Secondary | ICD-10-CM | POA: Diagnosis not present

## 2022-05-01 DIAGNOSIS — I739 Peripheral vascular disease, unspecified: Secondary | ICD-10-CM | POA: Diagnosis not present

## 2022-05-01 DIAGNOSIS — I87312 Chronic venous hypertension (idiopathic) with ulcer of left lower extremity: Secondary | ICD-10-CM | POA: Diagnosis not present

## 2022-05-01 DIAGNOSIS — L97821 Non-pressure chronic ulcer of other part of left lower leg limited to breakdown of skin: Secondary | ICD-10-CM | POA: Diagnosis not present

## 2022-05-01 DIAGNOSIS — I2510 Atherosclerotic heart disease of native coronary artery without angina pectoris: Secondary | ICD-10-CM | POA: Diagnosis not present

## 2022-05-01 DIAGNOSIS — E1140 Type 2 diabetes mellitus with diabetic neuropathy, unspecified: Secondary | ICD-10-CM | POA: Diagnosis not present

## 2022-05-01 DIAGNOSIS — I480 Paroxysmal atrial fibrillation: Secondary | ICD-10-CM | POA: Diagnosis not present

## 2022-05-01 DIAGNOSIS — L97522 Non-pressure chronic ulcer of other part of left foot with fat layer exposed: Secondary | ICD-10-CM | POA: Diagnosis not present

## 2022-05-01 DIAGNOSIS — I50813 Acute on chronic right heart failure: Secondary | ICD-10-CM | POA: Diagnosis not present

## 2022-05-01 DIAGNOSIS — S91301D Unspecified open wound, right foot, subsequent encounter: Secondary | ICD-10-CM | POA: Diagnosis not present

## 2022-05-01 DIAGNOSIS — I70244 Atherosclerosis of native arteries of left leg with ulceration of heel and midfoot: Secondary | ICD-10-CM | POA: Diagnosis not present

## 2022-05-04 ENCOUNTER — Other Ambulatory Visit: Payer: Self-pay

## 2022-05-04 ENCOUNTER — Ambulatory Visit (INDEPENDENT_AMBULATORY_CARE_PROVIDER_SITE_OTHER)
Admission: RE | Admit: 2022-05-04 | Discharge: 2022-05-04 | Disposition: A | Discharge status: Home / Self Care | Payer: Medicare Other | Source: Ambulatory Visit | Attending: Surgery | Admitting: Surgery

## 2022-05-04 ENCOUNTER — Ambulatory Visit (HOSPITAL_COMMUNITY)
Admission: RE | Admit: 2022-05-04 | Discharge: 2022-05-04 | Disposition: A | Discharge status: Home / Self Care | Payer: Medicare Other | Source: Ambulatory Visit | Attending: Surgery | Admitting: Surgery

## 2022-05-04 ENCOUNTER — Ambulatory Visit (INDEPENDENT_AMBULATORY_CARE_PROVIDER_SITE_OTHER): Payer: Medicare Other | Admitting: Physician Assistant

## 2022-05-04 VITALS — BP 115/62 | HR 87 | Temp 97.6°F | Resp 16 | Ht 72.0 in | Wt 267.0 lb

## 2022-05-04 DIAGNOSIS — I7025 Atherosclerosis of native arteries of other extremities with ulceration: Secondary | ICD-10-CM | POA: Insufficient documentation

## 2022-05-04 DIAGNOSIS — I70213 Atherosclerosis of native arteries of extremities with intermittent claudication, bilateral legs: Secondary | ICD-10-CM | POA: Diagnosis not present

## 2022-05-04 DIAGNOSIS — I739 Peripheral vascular disease, unspecified: Secondary | ICD-10-CM

## 2022-05-04 NOTE — Progress Notes (Signed)
Office Note     CC:  follow up Requesting Provider:  [name], FNP  HPI: [name] [name] is a 71 y.o. (Oct 01, 1950) male who presents for evaluation of bilateral lower extremity wounds.  Home health RN believes the left foot wound is worsening.  Mr. Janoski is well-known to [facility] having had the following procedures  03/21/2019: Right superficial femoral artery stent (claudication) 04/18/2019: Failed angioplasty, left superficial femoral artery occlusion (claudication) 11/12/2020: Failed angioplasty, left superficial femoral artery (ulcer) 11/22/2020: Left femoral to above-knee popliteal artery bypass graft with vein, right superficial femoral artery stent (bilateral ulcers 01/28/2021: Abdominal aortogram with bilateral runoff 08/15/2021: Redo Left femoral to below-knee popliteal artery bypass graft with 6 mm PTFE 08/18/2021: Retrograde peroneal cannulation with right tibioperoneal trunk stent   He is accompanied today by his wife.  He admittedly is not very mobile and is seen today in a wheelchair.  He is on Eliquis for atrial fibrillation.  He is taking Plavix daily.  Past Medical History:  Diagnosis Date   Anxiety    Arthritis    Atrial fibrillation (HCC)    CAD (coronary artery disease)    a. 2010: DES to CTO of RCA. EF 55% b. 07/2016: cath showing total occlusion within previously placed RCA stent (collaterals present), severe stenosis along LCx and OM1 (treated with 2 overlapping DES). c. repeat cath in 01/2018 showing patent stents along LCx and OM with CTO of D2, CTO of distal LCx, and CTO of RCA with collaterals present overall unchanged since 2018 with medical management recom   Cellulitis and abscess rt groin    Complication of anesthesia    " I woke up during a colonoscopy "      Depression    Diabetes mellitus    Diastolic CHF ([place])    Disorders of iron metabolism    Dysrhythmia    Fibromyalgia    GERD (gastroesophageal reflux disease)    History of hiatal hernia     Hyperlipidemia    Hypertension    Low serum testosterone level    Medically noncompliant    Myocardial infarction ([place])    05-23-20   Pneumonia    Sepsis ([place])    2023    Past Surgical History:  Procedure Laterality Date   ABDOMINAL AORTOGRAM W/LOWER EXTREMITY N/A 03/21/2019   Procedure: ABDOMINAL AORTOGRAM W/LOWER EXTREMITY;  Surgeon: [name], MD;  Location: [facility];  Service: Cardiovascular;  Laterality: N/A;   ABDOMINAL AORTOGRAM W/LOWER EXTREMITY N/A 11/12/2020   Procedure: ABDOMINAL AORTOGRAM W/LOWER EXTREMITY;  Surgeon: [name], MD;  Location: [facility];  Service: Cardiovascular;  Laterality: N/A;   ABDOMINAL AORTOGRAM W/LOWER EXTREMITY N/A 01/28/2021   Procedure: ABDOMINAL AORTOGRAM W/LOWER EXTREMITY;  Surgeon: [name], MD;  Location: [facility];  Service: Cardiovascular;  Laterality: N/A;   ABDOMINAL AORTOGRAM W/LOWER EXTREMITY Bilateral 07/29/2021   Procedure: ABDOMINAL AORTOGRAM W/LOWER EXTREMITY;  Surgeon: [name], MD;  Location: [facility];  Service: Cardiovascular;  Laterality: Bilateral;   ANGIOPLASTY N/A 08/15/2021   Procedure: ATTEMPTED RIGHT PERONEAL  ANGIOPLASTY, LEFT PERONEAL ANGIOPLASTY;  Surgeon: [name], MD;  Location: [facility];  Service: Vascular;  Laterality: N/A;   BACK SURGERY  2015   ACDF by Dr. Orland Jarred STUDY  12/23/2021   Procedure: BUBBLE STUDY;  Surgeon: Elouise Munroe, MD;  Location: [place] [facility] [place];  Service: Cardiology;;   COLONOSCOPY N/A 10/01/2014   Dr. [name]: multiple  tubular adenomas removed, colonic diverticulosis, redundant colon. next tcs advised for 09/2017. PATIENT NEEDS PROPOFOL FOR FAILED CONSCIOUS SEDATION   CORONARY STENT INTERVENTION N/A 07/30/2016   Procedure: Coronary Stent Intervention;  Surgeon: [name], MD;  Location: [facility];  Service: Cardiovascular;  Laterality: N/A;   CORONARY STENT PLACEMENT  2000   By Dr. Olevia Perches   EP IMPLANTABLE DEVICE  N/A 05/25/2016   Procedure: Loop Recorder Insertion;  Surgeon: [name], MD;  Location: [facility];  Service: Cardiovascular;  Laterality: N/A;   ESOPHAGOGASTRODUODENOSCOPY     esophagus stretched remotely at [facility]   ESOPHAGOGASTRODUODENOSCOPY N/A 10/01/2014   Dr. [name]: patchy mottling/erythema and minimal polypoid appearance of gastric mucosa. bx with mild inlammation but no H.pylori   FEMORAL-POPLITEAL BYPASS GRAFT Left 11/22/2020   Procedure: LEFT FEMORAL-POPLITEAL BYPASS GRAFT;  Surgeon: [name], MD;  [facility];  Service: Vascular;  Laterality: Left;   FEMORAL-POPLITEAL BYPASS GRAFT Left 08/15/2021   Procedure: REDO LEFT FEMORAL-POPLITEAL BYPASS USING PROPATEN GRAFT;  Surgeon: [name], MD;  Location: [place];  Service: Vascular;  Laterality: Left;   HERNIA REPAIR  0762   umbilical   INSERTION OF ILIAC STENT Right 11/22/2020   Procedure: INSERTION OF ELUVIA STENT INTO RIGHT DISTAL SUPERFICIAL FEMORAL ARTERY;  Surgeon: [name], MD;  Location: [facility];  Service: Vascular;  Laterality: Right;   LEFT HEART CATH AND CORONARY ANGIOGRAPHY N/A 07/30/2016   Procedure: Left Heart Cath and Coronary Angiography;  Surgeon: [name], MD;  Location: [facility];  Service: Cardiovascular;  Laterality: N/A;   LEFT HEART CATH AND CORONARY ANGIOGRAPHY N/A 01/19/2018   Procedure: LEFT HEART CATH AND CORONARY ANGIOGRAPHY;  Surgeon: [name], MD;  Location: [facility] [place] [facility];  Service: Cardiovascular;  Laterality: N/A;   LEFT HEART CATH AND CORONARY ANGIOGRAPHY N/A 05/24/2020   Procedure: LEFT HEART CATH AND CORONARY ANGIOGRAPHY;  Surgeon: Burnell Blanks, MD;  Location: [facility];  Service: Cardiovascular;  Laterality: N/A;   LEFT HEART CATH AND CORONARY ANGIOGRAPHY N/A 08/06/2021   Procedure: LEFT HEART CATH AND CORONARY ANGIOGRAPHY;  Surgeon: Burnell Blanks, MD;  Location: [facility];  Service: Cardiovascular;  Laterality:  N/A;   LESION REMOVAL     Lip and hand    LOWER EXTREMITY ANGIOGRAM Right 11/22/2020   Procedure: RIGHT LEG ANGIOGRAM;  Surgeon: [name], MD;  [facility];  Service: Vascular;  Laterality: Right;   LOWER EXTREMITY ANGIOGRAM Right 08/15/2021   Procedure: RIGHT LOWER EXTREMITY ANGIOGRAM;  Surgeon: [name], MD;  [facility];  Service: Vascular;  Laterality: Right;   LOWER EXTREMITY ANGIOGRAPHY N/A 04/18/2019   Procedure: LOWER EXTREMITY ANGIOGRAPHY;  Surgeon: [name], MD;  Location: [facility];  Service: Cardiovascular;  Laterality: N/A;   LOWER EXTREMITY ANGIOGRAPHY N/A 08/18/2021   Procedure: Lower Extremity Angiography;  Surgeon: Waynetta Sandy, MD;  Location: [facility];  Service: Cardiovascular;  Laterality: N/A;   NECK SURGERY     PERIPHERAL VASCULAR BALLOON ANGIOPLASTY  04/18/2019   Procedure: PERIPHERAL VASCULAR BALLOON ANGIOPLASTY;  Surgeon: [name], MD;  Location: [facility];  Service: Cardiovascular;;   PERIPHERAL VASCULAR BALLOON ANGIOPLASTY Left 11/12/2020   Procedure: PERIPHERAL VASCULAR BALLOON ANGIOPLASTY;  Surgeon: [name], MD;  Location: [place] [facility];  Service: Cardiovascular;  Laterality: Left;  Failed PTA of superficial femoral artery.   PERIPHERAL VASCULAR  BALLOON ANGIOPLASTY Right 08/18/2021   Procedure: PERIPHERAL VASCULAR BALLOON ANGIOPLASTY;  Surgeon: Waynetta Sandy, MD;  Location: [facility];  Service: Cardiovascular;  Laterality: Right;  peroneal   PERIPHERAL VASCULAR INTERVENTION Right 03/21/2019   Procedure: PERIPHERAL VASCULAR INTERVENTION;  Surgeon: [name], MD;  Location: [facility];  Service: Cardiovascular;  Laterality: Right;  superficial femoral   PERIPHERAL VASCULAR INTERVENTION Right 08/18/2021   Procedure: PERIPHERAL VASCULAR INTERVENTION;  Surgeon: Waynetta Sandy, MD;  Location: [facility];  Service: Cardiovascular;   Laterality: Right;  tibial peroneal trunk   TEE WITHOUT CARDIOVERSION N/A 12/23/2021   Procedure: TRANSESOPHAGEAL ECHOCARDIOGRAM (TEE);  Surgeon: Elouise Munroe, MD;  Location: [facility];  Service: Cardiology;  Laterality: N/A;    Social History   Socioeconomic History   Marital status: Married    Spouse name: Belenda Cruise   Number of children: 2   Years of education: 12   Highest education level: 12th grade  Occupational History   Occupation: retired  Tobacco Use   Smoking status: Former    Packs/day: 0.25    Years: 51.00    Total pack years: 12.75    Types: Cigarettes    Quit date: 08/14/2016    Years since quitting: 5.7   Smokeless tobacco: Never   Tobacco comments:    smokes  a pack a week  Vaping Use   Vaping Use: Never used  Substance and Sexual Activity   Alcohol use: No    Alcohol/week: 0.0 standard drinks of alcohol   Drug use: No   Sexual activity: Yes  Other Topics Concern   Not on file  Social History Narrative   Lives with his wife.  Retired.  Unable to afford expensive medicines.     He has 2 children from previous marriage - they live in [place]       Caffeine: 1 cup of 1/2 caff coffee in AM, drinks more if at a restaurant    Like fishing and working outside   [employer] Strain: [place] (01/06/2021)   Overall Financial Resource Strain (CARDIA)    Difficulty of Paying Living Expenses: Somewhat hard  Food Insecurity: No Food Insecurity (02/16/2022)   Hunger Vital Sign    Worried About Running Out of Food in the Last Year: Never true    Ran Out of Food in the Last Year: Never true  Transportation Needs: No Transportation Needs (02/16/2022)   PRAPARE - Hydrologist (Medical): No    Lack of Transportation (Non-Medical): No  Physical Activity: Insufficiently Active (01/06/2021)   Exercise Vital Sign    Days of Exercise per Week: 7 days    Minutes of Exercise per Session: 10 min   Stress: No Stress Concern Present (02/16/2022)   [place]    Feeling of Stress : Only a little  Social Connections: Moderately Isolated (01/06/2021)   Social Connection and Isolation Panel [NHANES]    Frequency of Communication with Friends and Family: More than three times a week    Frequency of Social Gatherings with Friends and Family: Twice a week    Attends Religious Services: Never    Marine scientist or Organizations: No    Attends Archivist Meetings: Never    Marital Status: Married  Human resources officer Violence: Not At Risk (02/16/2022)   Humiliation, Afraid, Rape, and Kick questionnaire  Fear of Current or Ex-Partner: No    Emotionally Abused: No    Physically Abused: No    Sexually Abused: No    Family History  Problem Relation Age of Onset   Diabetes Father    Valvular heart disease Father    Arthritis Father    Heart disease Father    Stroke Father    Alzheimer's disease Mother    Hyperlipidemia Mother    Hypertension Mother    Arthritis Mother    Lung cancer Mother    Stroke Mother    Headache Mother    Arthritis/Rheumatoid Sister    Diabetes Sister    Hypertension Sister    Hyperlipidemia Sister    Depression Sister    Dementia Maternal Aunt    Dementia Maternal Uncle    Heart disease Maternal Uncle    Stomach cancer Paternal Uncle    Colon cancer Neg Hx    Liver disease Neg Hx     Current Outpatient Medications  Medication Sig Dispense Refill   albuterol (VENTOLIN HFA) 108 (90 Base) MCG/ACT inhaler Inhale 2 puffs into the lungs every 6 (six) hours as needed for wheezing or shortness of breath. 8.5 g 0   amoxicillin-clavulanate (AUGMENTIN) 875-125 MG tablet Take 1 tablet by mouth every 12 (twelve) hours. if fever >100.81F 10 tablet 0   apixaban (ELIQUIS) 5 MG TABS tablet TAKE ONE (1) TABLET BY MOUTH TWICE DAILY 60 tablet 2   Calcium Carbonate Antacid (ALKA-SELTZER  ANTACID PO) Take 1 tablet by mouth 2 (two) times daily as needed (heartburn, indigestion).     clopidogrel (PLAVIX) 75 MG tablet TAKE 1 TABLET BY MOUTH DAILY (Patient taking differently: Take 75 mg by mouth daily.) 30 tablet 10   DULoxetine (CYMBALTA) 30 MG capsule Take 3 capsules (90 mg total) by mouth daily. (Patient taking differently: Take 90 mg by mouth at bedtime.) 120 capsule 1   ezetimibe (ZETIA) 10 MG tablet TAKE 1 TABLET BY MOUTH DAILY 90 tablet 3   fenofibrate 160 MG tablet TAKE 1 TABLET BY MOUTH DAILY 30 tablet 2   furosemide (LASIX) 20 MG tablet TAKE ONE (1) TABLET BY MOUTH TWICE DAILY FOR 7 DAYS THEN TAKE 1 TABLET BY MOUTH DAILY 30 tablet 2   gabapentin (NEURONTIN) 300 MG capsule Take 3 capsules (900 mg total) by mouth 2 (two) times daily. 120 capsule 6   HUMULIN 70/30 (70-30) 100 UNIT/ML injection INJECT 60 UNITS SUBCUTANEOUSLY TWICE DAILY WITH MEALS 30 mL 0   HYDROcodone-acetaminophen (NORCO) 10-325 MG tablet Take 1 tablet by mouth every 4 (four) hours as needed for moderate pain. 30 tablet 0   insulin degludec (TRESIBA FLEXTOUCH) 200 UNIT/ML FlexTouch Pen Inject 16 Units into the skin at bedtime. 9 mL 2   insulin lispro (HUMALOG) 100 UNIT/ML KwikPen INJECT 15-20 UNITS SUBCUTANEOUSLY THREE TIMES A DAY WITH MEALS (Patient taking differently: 15-20 Units 3 (three) times daily. Sliding scale) 15 mL 10   isosorbide mononitrate (IMDUR) 30 MG 24 hr tablet TAKE 1 TABLET BY MOUTH DAILY (Patient taking differently: Take 30 mg by mouth daily.) 30 tablet 10   Menthol, Topical Analgesic, (BLUE-EMU MAXIMUM STRENGTH EX) Apply 1 application  topically daily as needed (pain).     metoprolol succinate (TOPROL-XL) 50 MG 24 hr tablet Take 1 tablet (50 mg total) by mouth daily. 30 tablet 3   metoprolol tartrate (LOPRESSOR) 50 MG tablet Take 50 mg by mouth as needed.     MOUNJARO 7.5 MG/0.5ML Pen Inject into the skin.  nitroGLYCERIN (NITROSTAT) 0.4 MG SL tablet DISSOLVE ONE TABLET UNDER THE TONGUE  EVERY 5 MINUTES AS NEEDED FOR CHEST PAIN.  DO NOT EXCEED A TOTAL OF 3 DOSES IN 15 MINUTES Strength: 0.4 mg (Patient taking differently: Place 0.4 mg under the tongue every 5 (five) minutes x 3 doses as needed for chest pain.) 25 tablet 0   pantoprazole (PROTONIX) 40 MG tablet Take 1 tablet (40 mg total) by mouth 2 (two) times daily. (Patient taking differently: Take 40 mg by mouth daily.) 60 tablet 0   tirzepatide (MOUNJARO) 10 MG/0.5ML Pen Inject 10 mg into the skin once a week. 6 mL 2   TRUEPLUS INSULIN SYRINGE 28G X 1/2" 1 ML MISC USE TO GIVE INSULIN FIVE TIMES DAILY     No current facility-administered medications for this visit.    Allergies  Allergen Reactions   Shellfish Allergy Anaphylaxis, Hives and Swelling    Tongue swelling    Sulfa Antibiotics Anaphylaxis, Hives and Swelling    Tongue swelling   Ace Inhibitors Other (See Comments) and Cough    CKD, renal failure    Invokana [Canagliflozin] Other (See Comments)    Syncope Dehydration    Lexapro [Escitalopram] Other (See Comments)    Buzzing in ears Headaches "Felt like a zombie"   Metformin And Related Itching   Pravachol [Pravastatin Sodium] Other (See Comments)    Myalgias    Repatha [Evolocumab] Other (See Comments)    Myalgias Flu like symptoms    Tricor [Fenofibrate] Other (See Comments)    Myalgias     Zestril [Lisinopril] Cough   Farxiga [Dapagliflozin] Other (See Comments)    Nephro d/c'd   Crestor [Rosuvastatin] Other (See Comments)    Myalgias    Horse-Derived Products Rash    horse serum   Lipitor [Atorvastatin] Other (See Comments)    Myalgias    Livalo [Pitavastatin] Other (See Comments)    Myalgias    Milk ([place]ow) Diarrhea and Nausea Only    Stomach upset    Tape Rash     REVIEW OF SYSTEMS:   '[X]'$  denotes positive finding, '[ ]'$  denotes negative finding Cardiac  Comments:  Chest pain or chest pressure:    Shortness of breath upon exertion:    Short of breath when lying flat:     Irregular heart rhythm:        Vascular    Pain in calf, thigh, or hip brought on by ambulation:    Pain in feet at night that wakes you up from your sleep:     Blood clot in your veins:    Leg swelling:         Pulmonary    Oxygen at home:    Productive cough:     Wheezing:         Neurologic    Sudden weakness in arms or legs:     Sudden numbness in arms or legs:     Sudden onset of difficulty speaking or slurred speech:    Temporary loss of vision in one eye:     Problems with dizziness:         Gastrointestinal    Blood in stool:     Vomited blood:         Genitourinary    Burning when urinating:     Blood in urine:        Psychiatric    Major depression:         Hematologic    Bleeding  problems:    Problems with blood clotting too easily:        Skin    Rashes or ulcers:        Constitutional    Fever or chills:      PHYSICAL EXAMINATION:  Vitals:   05/04/22 1411  BP: 115/62  Pulse: 87  Resp: 16  Temp: 97.6 F (36.4 C)  TempSrc: Temporal  SpO2: 98%  Weight: 267 lb (121.1 kg)  Height: 6' (1.829 m)    General:  WDWN in NAD; vital signs documented above Gait: Not observed HENT: WNL, normocephalic Pulmonary: normal non-labored breathing , without Rales, rhonchi,  wheezing Cardiac: regular HR Abdomen: soft, NT, no masses Skin: without rashes Vascular Exam/Pulses: Palpable right femoral pulse 1+; brisk right PT signal by Doppler; brisk left AT signal by Doppler Extremities: Venous stasis wounds of bilateral lower legs; multiple eschar's dry right foot; large wound of dorsal foot at the base of the first and second toe with some granulation tissue Musculoskeletal: no muscle wasting or atrophy  Neurologic: A&O X 3;  No focal weakness or paresthesias are detected Psychiatric:  The pt has Normal affect.   Non-Invasive Vascular Imaging:   Right leg arterial duplex demonstrates occluded AT and PT Peroneal is monophasic however patent Right SFA  stent patent  Left leg bypass graft with 329 cm inflow velocity Mid graft velocity drops to 24 cm/s Outflow velocity of 202 cm/s  ABI/TBIToday's ABIToday's TBIPrevious ABIPrevious TBI  +-------+-----------+-----------+------------+------------+  Right 0.55       0.00       0.77        0.00          +-------+-----------+-----------+------------+------------+  Left  0.66       0.00       0.73        0.35              ASSESSMENT/PLAN:: 71 y.o. male here for follow up for worsening left foot wounds and for PAD surveillance  -Patient was added on today for evaluation of worsening left foot wounds noticed by the home health nurse.  Patient and wife state that these have been slowly worsening.  He has wounds on bilateral lower extremities having had multiple revascularization procedures which are documented above.  Duplex demonstrates a threatened left leg bypass graft with inflow stenosis as well as velocities in the 20s in the mid graft.  For this reason he will be scheduled for aortogram with left lower extremity runoff and possible intervention.  He has documented CKD followed by Dr. Theador Hawthorne with a most recent creatinine of 1.6 on 03/16/2022.  He understands risk of kidney injury however he agrees this is justifiable.  He would prefer to have the angiogram performed next year.  He is scheduled for January 2nd with Dr. [name].   Dagoberto Ligas, PA-C [facility] [phone]  Clinic MD:   Virl Cagey on call

## 2022-05-05 ENCOUNTER — Ambulatory Visit: Payer: Medicare Other | Admitting: Infectious Diseases

## 2022-05-06 DIAGNOSIS — I2510 Atherosclerotic heart disease of native coronary artery without angina pectoris: Secondary | ICD-10-CM | POA: Diagnosis not present

## 2022-05-06 DIAGNOSIS — E1140 Type 2 diabetes mellitus with diabetic neuropathy, unspecified: Secondary | ICD-10-CM | POA: Diagnosis not present

## 2022-05-06 DIAGNOSIS — L97821 Non-pressure chronic ulcer of other part of left lower leg limited to breakdown of skin: Secondary | ICD-10-CM | POA: Diagnosis not present

## 2022-05-06 DIAGNOSIS — I739 Peripheral vascular disease, unspecified: Secondary | ICD-10-CM | POA: Diagnosis not present

## 2022-05-06 DIAGNOSIS — S91301D Unspecified open wound, right foot, subsequent encounter: Secondary | ICD-10-CM | POA: Diagnosis not present

## 2022-05-06 DIAGNOSIS — I50813 Acute on chronic right heart failure: Secondary | ICD-10-CM | POA: Diagnosis not present

## 2022-05-06 DIAGNOSIS — I87312 Chronic venous hypertension (idiopathic) with ulcer of left lower extremity: Secondary | ICD-10-CM | POA: Diagnosis not present

## 2022-05-06 DIAGNOSIS — L97522 Non-pressure chronic ulcer of other part of left foot with fat layer exposed: Secondary | ICD-10-CM | POA: Diagnosis not present

## 2022-05-06 DIAGNOSIS — I480 Paroxysmal atrial fibrillation: Secondary | ICD-10-CM | POA: Diagnosis not present

## 2022-05-06 DIAGNOSIS — I70244 Atherosclerosis of native arteries of left leg with ulceration of heel and midfoot: Secondary | ICD-10-CM | POA: Diagnosis not present

## 2022-05-07 ENCOUNTER — Ambulatory Visit: Payer: Self-pay | Admitting: *Deleted

## 2022-05-07 NOTE — Patient Outreach (Signed)
  Care Coordination   05/07/2022 Name: MINOR IDEN MRN: [id number] DOB: 03/30/1951   Care Coordination Outreach Attempts:  An unsuccessful telephone outreach was attempted today to offer the patient information about available care coordination services as a benefit of their health plan.   Follow Up Plan:  Additional outreach attempts will be made to offer the patient care coordination information and services.   Encounter Outcome:  No Answer   Care Coordination Interventions:  No, not indicated     Shonna Deiter L. Lavina Hamman, RN, BSN, [place] Coordinator Office number [phone]

## 2022-05-07 NOTE — Patient Instructions (Signed)
Visit Information  Thank you for taking time to visit with me today. Please don't hesitate to contact me if I can be of assistance to you.   Following are the goals we discussed today:   Goals Addressed               This Visit's Progress     Patient Stated     Fall [facility]) (pt-stated)   On track     Care Coordination Interventions: Provided written and verbal education re: potential causes of falls and Fall prevention strategies Advised patient of importance of notifying provider of falls Assessed for falls since last encounter Updated his wife on the outreach to his pcp  Confirms he has not had any other falls and is healing       improved uncontrolled diabetes ([facility]) (pt-stated)        Care Coordination Interventions: Provided education to patient about basic DM disease process Reviewed medications with patient and discussed importance of medication adherence Discussed plans with patient for ongoing care management follow up and provided patient with direct contact information for care management team Discussed the importance of providing him food he prefers to improve his appetite Confirmed he does not see an endocrinologist       manage atrial fibrillation [facility]) (pt-stated)   Not on track     Care Coordination Interventions: Reviewed importance of adherence to anticoagulant exactly as prescribed Afib action plan reviewed Screening for signs and symptoms of depression related to chronic disease state  Assessed social determinant of health barriers Assessed for symptoms of afib, Confirmed an episode that lasted an hour this week Discuss some methods suggested to slow the episode down        Our next appointment is by telephone on 05/07/22 at 1030  Please call the care guide team at [phone] if you need to cancel or reschedule your appointment.   If you are experiencing a Mental Health or [place] or need someone to talk to, please call the Suicide  and Crisis Lifeline: 988 call the [place] National Suicide Prevention Lifeline: 470-220-6589 or TTY: (567) 129-0894 TTY (413) 181-4762) to talk to a trained counselor call 1-800-273-TALK (toll free, 24 hour hotline) call the [facility]: (928)500-4132 call 911   Patient verbalizes understanding of instructions and care plan provided today and agrees to view in [place]. Active MyChart status and patient understanding of how to access instructions and care plan via MyChart confirmed with patient.     The patient has been provided with contact information for the care management team and has been advised to call with any health related questions or concerns.    Sandro Burgo L. Lavina Hamman, RN, BSN, [place] Coordinator Office number [phone]

## 2022-05-08 ENCOUNTER — Telehealth: Payer: Self-pay | Admitting: Family

## 2022-05-08 DIAGNOSIS — E1140 Type 2 diabetes mellitus with diabetic neuropathy, unspecified: Secondary | ICD-10-CM | POA: Diagnosis not present

## 2022-05-08 DIAGNOSIS — I480 Paroxysmal atrial fibrillation: Secondary | ICD-10-CM | POA: Diagnosis not present

## 2022-05-08 DIAGNOSIS — I739 Peripheral vascular disease, unspecified: Secondary | ICD-10-CM | POA: Diagnosis not present

## 2022-05-08 DIAGNOSIS — I87312 Chronic venous hypertension (idiopathic) with ulcer of left lower extremity: Secondary | ICD-10-CM | POA: Diagnosis not present

## 2022-05-08 DIAGNOSIS — I50813 Acute on chronic right heart failure: Secondary | ICD-10-CM | POA: Diagnosis not present

## 2022-05-08 DIAGNOSIS — L97522 Non-pressure chronic ulcer of other part of left foot with fat layer exposed: Secondary | ICD-10-CM | POA: Diagnosis not present

## 2022-05-08 DIAGNOSIS — I2510 Atherosclerotic heart disease of native coronary artery without angina pectoris: Secondary | ICD-10-CM | POA: Diagnosis not present

## 2022-05-08 DIAGNOSIS — S91301D Unspecified open wound, right foot, subsequent encounter: Secondary | ICD-10-CM | POA: Diagnosis not present

## 2022-05-08 DIAGNOSIS — I70244 Atherosclerosis of native arteries of left leg with ulceration of heel and midfoot: Secondary | ICD-10-CM | POA: Diagnosis not present

## 2022-05-08 DIAGNOSIS — L97821 Non-pressure chronic ulcer of other part of left lower leg limited to breakdown of skin: Secondary | ICD-10-CM | POA: Diagnosis not present

## 2022-05-12 ENCOUNTER — Other Ambulatory Visit: Payer: Self-pay | Admitting: Family

## 2022-05-12 DIAGNOSIS — I70244 Atherosclerosis of native arteries of left leg with ulceration of heel and midfoot: Secondary | ICD-10-CM | POA: Diagnosis not present

## 2022-05-12 DIAGNOSIS — S91301D Unspecified open wound, right foot, subsequent encounter: Secondary | ICD-10-CM | POA: Diagnosis not present

## 2022-05-12 DIAGNOSIS — L97522 Non-pressure chronic ulcer of other part of left foot with fat layer exposed: Secondary | ICD-10-CM | POA: Diagnosis not present

## 2022-05-12 DIAGNOSIS — E1140 Type 2 diabetes mellitus with diabetic neuropathy, unspecified: Secondary | ICD-10-CM | POA: Diagnosis not present

## 2022-05-12 DIAGNOSIS — I480 Paroxysmal atrial fibrillation: Secondary | ICD-10-CM | POA: Diagnosis not present

## 2022-05-12 DIAGNOSIS — I87312 Chronic venous hypertension (idiopathic) with ulcer of left lower extremity: Secondary | ICD-10-CM | POA: Diagnosis not present

## 2022-05-12 DIAGNOSIS — I739 Peripheral vascular disease, unspecified: Secondary | ICD-10-CM | POA: Diagnosis not present

## 2022-05-12 DIAGNOSIS — I2510 Atherosclerotic heart disease of native coronary artery without angina pectoris: Secondary | ICD-10-CM | POA: Diagnosis not present

## 2022-05-12 DIAGNOSIS — L97821 Non-pressure chronic ulcer of other part of left lower leg limited to breakdown of skin: Secondary | ICD-10-CM | POA: Diagnosis not present

## 2022-05-12 DIAGNOSIS — I50813 Acute on chronic right heart failure: Secondary | ICD-10-CM | POA: Diagnosis not present

## 2022-05-14 ENCOUNTER — Ambulatory Visit: Payer: Self-pay | Admitting: *Deleted

## 2022-05-14 NOTE — Patient Outreach (Signed)
  Care Coordination   05/14/2022 Name: HAN VEJAR MRN: [id number] DOB: December 29, 1950   Care Coordination Outreach Attempts:  A second unsuccessful outreach was attempted today to offer the patient with information about available care coordination services as a benefit of their health plan.     Follow Up Plan:  Additional outreach attempts will be made to offer the patient care coordination information and services.   Encounter Outcome:  No Answer   Care Coordination Interventions:  No, not indicated     Stran Raper L. Lavina Hamman, RN, BSN, [place] Coordinator Office number [phone]

## 2022-05-15 ENCOUNTER — Telehealth: Payer: Self-pay | Admitting: *Deleted

## 2022-05-15 DIAGNOSIS — I50813 Acute on chronic right heart failure: Secondary | ICD-10-CM | POA: Diagnosis not present

## 2022-05-15 DIAGNOSIS — I2510 Atherosclerotic heart disease of native coronary artery without angina pectoris: Secondary | ICD-10-CM | POA: Diagnosis not present

## 2022-05-15 DIAGNOSIS — I480 Paroxysmal atrial fibrillation: Secondary | ICD-10-CM | POA: Diagnosis not present

## 2022-05-15 DIAGNOSIS — L97821 Non-pressure chronic ulcer of other part of left lower leg limited to breakdown of skin: Secondary | ICD-10-CM | POA: Diagnosis not present

## 2022-05-15 DIAGNOSIS — L97522 Non-pressure chronic ulcer of other part of left foot with fat layer exposed: Secondary | ICD-10-CM | POA: Diagnosis not present

## 2022-05-15 DIAGNOSIS — I87312 Chronic venous hypertension (idiopathic) with ulcer of left lower extremity: Secondary | ICD-10-CM | POA: Diagnosis not present

## 2022-05-15 DIAGNOSIS — S91301D Unspecified open wound, right foot, subsequent encounter: Secondary | ICD-10-CM | POA: Diagnosis not present

## 2022-05-15 DIAGNOSIS — I739 Peripheral vascular disease, unspecified: Secondary | ICD-10-CM | POA: Diagnosis not present

## 2022-05-15 DIAGNOSIS — E1140 Type 2 diabetes mellitus with diabetic neuropathy, unspecified: Secondary | ICD-10-CM | POA: Diagnosis not present

## 2022-05-15 DIAGNOSIS — I70244 Atherosclerosis of native arteries of left leg with ulceration of heel and midfoot: Secondary | ICD-10-CM | POA: Diagnosis not present

## 2022-05-15 NOTE — Telephone Encounter (Signed)
Call incoming from [facility]. Patient's glucose was 450 this morning. He took 80 units of 70/30, then 25 units fast acting insulin and blood sugar is still 404.  Discussed with Dr Lajuana Ripple and she advised for patient to go to ER.  Home health nurse who is currently at patient's home made aware.

## 2022-05-19 ENCOUNTER — Ambulatory Visit (HOSPITAL_COMMUNITY): Admission: RE | Admit: 2022-05-19 | Payer: Medicare Other | Source: Home / Self Care | Admitting: Surgery

## 2022-05-19 ENCOUNTER — Encounter (HOSPITAL_COMMUNITY): Admission: RE | Payer: Self-pay | Source: Home / Self Care

## 2022-05-19 ENCOUNTER — Telehealth: Payer: Self-pay | Admitting: *Deleted

## 2022-05-19 SURGERY — ABDOMINAL AORTOGRAM W/LOWER EXTREMITY
Anesthesia: LOCAL

## 2022-05-19 NOTE — Telephone Encounter (Signed)
PA done via [facility]. Auth# [id number], DOS: May 25, 2022 - Aug 23, 2022

## 2022-05-19 NOTE — Patient Instructions (Signed)
[name] [name]  05/19/2022     '@PREFPERIOPPHARMACY'$ @   Your procedure is scheduled on  05/25/2022.   Report to [facility] at  0600  A.M.   Call this number if you have problems the morning of surgery:  [phone]  If you experience any cold or flu symptoms such as cough, fever, chills, shortness of breath, etc. between now and your scheduled surgery, please notify us at the above number.   Remember:  Follow the diet and prep instructions given to you by the office.   Your last dose of mounjaro should have been on 05/17/2022.     Your last dose of plavix should be on 05/19/2022.    Your last dose of eliquis should be on 05/23/2022.  Use your inhaler before you come and bring your rescue inhaler with  you.  On Sunday take 1/2 of your usual insulin with meals and 1/2 (8 units) of your night time tresiba.    DO NOT take any medications for diabetes the morning of your procedure.     Take these medicines the morning of surgery with A SIP OF WATER        cymbalta, gabapentin, hydrocodone(if needed), isosorbide, metoprolol, pantoprazole.     Do not wear jewelry, make-up or nail polish.  Do not wear lotions, powders, or perfumes, or deodorant.  Do not shave 48 hours prior to surgery.  Men may shave face and neck.  Do not bring valuables to the hospital.  [facility] is not responsible for any belongings or valuables.  Contacts, dentures or bridgework may not be worn into surgery.  Leave your suitcase in the car.  After surgery it may be brought to your room.  For patients admitted to the hospital, discharge time will be determined by your treatment team.  Patients discharged the day of surgery will not be allowed to drive home and must have someone with them for 24 hours.    Special instructions:   DO NOT smoke tobacco or vape for 24 hours before your procedure.  Please read over the following fact sheets that you were given. Anesthesia Post-op Instructions and  Care and Recovery After Surgery      Upper Endoscopy, Adult, Care After After the procedure, it is common to have a sore throat. It is also common to have: Mild stomach pain or discomfort. Bloating. Nausea. Follow these instructions at home: The instructions below may help you care for yourself at home. Your health care provider may give you more instructions. If you have questions, ask your health care provider. If you were given a sedative during the procedure, it can affect you for several hours. Do not drive or operate machinery until your health care provider says that it is safe. If you will be going home right after the procedure, plan to have a responsible adult: Take you home from the hospital or clinic. You will not be allowed to drive. Care for you for the time you are told. Follow instructions from your health care provider about what you may eat and drink. Return to your normal activities as told by your health care provider. Ask your health care provider what activities are safe for you. Take over-the-counter and prescription medicines only as told by your health care provider. Contact a health care provider if you: Have a sore throat that lasts longer than one day. Have trouble swallowing. Have a fever. Get help right away  if you: Vomit blood or your vomit looks like coffee grounds. Have bloody, black, or tarry stools. Have a very bad sore throat or you cannot swallow. Have difficulty breathing or very bad pain in your chest or abdomen. These symptoms may be an emergency. Get help right away. Call 911. Do not wait to see if the symptoms will go away. Do not drive yourself to the hospital. Summary After the procedure, it is common to have a sore throat, mild stomach discomfort, bloating, and nausea. If you were given a sedative during the procedure, it can affect you for several hours. Do not drive until your health care provider says that it is safe. Follow  instructions from your health care provider about what you may eat and drink. Return to your normal activities as told by your health care provider. This information is not intended to replace advice given to you by your health care provider. Make sure you discuss any questions you have with your health care provider. Document Revised: 08/13/2021 Document Reviewed: 08/13/2021 Elsevier Patient Education  [place]. Esophageal Dilatation Esophageal dilatation, also called esophageal dilation, is a procedure to widen or open a blocked or narrowed part of the esophagus. The esophagus is the part of the body that moves food and liquid from the mouth to the stomach. You may need this procedure if: You have a buildup of scar tissue in your esophagus that makes it difficult, painful, or impossible to swallow. This can be caused by gastroesophageal reflux disease (GERD). You have cancer of the esophagus. There is a problem with how food moves through your esophagus. In some cases, you may need this procedure repeated at a later time to dilate the esophagus gradually. Tell a health care provider about: Any allergies you have. All medicines you are taking, including vitamins, herbs, eye drops, creams, and over-the-counter medicines. Any problems you or family members have had with anesthetic medicines. Any blood disorders you have. Any surgeries you have had. Any medical conditions you have. Any antibiotic medicines you are required to take before dental procedures. Whether you are pregnant or may be pregnant. What are the risks? Generally, this is a safe procedure. However, problems may occur, including: Bleeding due to a tear in the lining of the esophagus. A hole, or perforation, in the esophagus. What happens before the procedure? Ask your health care provider about: Changing or stopping your regular medicines. This is especially important if you are taking diabetes medicines or blood  thinners. Taking medicines such as aspirin and ibuprofen. These medicines can thin your blood. Do not take these medicines unless your health care provider tells you to take them. Taking over-the-counter medicines, vitamins, herbs, and supplements. Follow instructions from your health care provider about eating or drinking restrictions. Plan to have a responsible adult take you home from the hospital or clinic. Plan to have a responsible adult care for you for the time you are told after you leave the hospital or clinic. This is important. What happens during the procedure? You may be given a medicine to help you relax (sedative). A numbing medicine may be sprayed into the back of your throat, or you may gargle the medicine. Your health care provider may perform the dilatation using various surgical instruments, such as: Simple dilators. This instrument is carefully placed in the esophagus to stretch it. Guided wire bougies. This involves using an endoscope to insert a wire into the esophagus. A dilator is passed over this wire to enlarge  the esophagus. Then the wire is removed. Balloon dilators. An endoscope with a small balloon is inserted into the esophagus. The balloon is inflated to stretch the esophagus and open it up. The procedure may vary among health care providers and hospitals. What can I expect after the procedure? Your blood pressure, heart rate, breathing rate, and blood oxygen level will be monitored until you leave the hospital or clinic. Your throat may feel slightly sore and numb. This will get better over time. You will not be allowed to eat or drink until your throat is no longer numb. When you are able to drink, urinate, and sit on the edge of the bed without nausea or dizziness, you may be able to return home. Follow these instructions at home: Take over-the-counter and prescription medicines only as told by your health care provider. If you were given a sedative during  the procedure, it can affect you for several hours. Do not drive or operate machinery until your health care provider says that it is safe. Plan to have a responsible adult care for you for the time you are told. This is important. Follow instructions from your health care provider about any eating or drinking restrictions. Do not use any products that contain nicotine or tobacco, such as cigarettes, e-cigarettes, and chewing tobacco. If you need help quitting, ask your health care provider. Keep all follow-up visits. This is important. Contact a health care provider if: You have a fever. You have pain that is not relieved by medicine. Get help right away if: You have chest pain. You have trouble breathing. You have trouble swallowing. You vomit blood. You have black, tarry, or bloody stools. These symptoms may represent a serious problem that is an emergency. Do not wait to see if the symptoms will go away. Get medical help right away. Call your local emergency services (911 in the [place] Do not drive yourself to the hospital. Summary Esophageal dilatation, also called esophageal dilation, is a procedure to widen or open a blocked or narrowed part of the esophagus. Plan to have a responsible adult take you home from the hospital or clinic. For this procedure, a numbing medicine may be sprayed into the back of your throat, or you may gargle the medicine. Do not drive or operate machinery until your health care provider says that it is safe. This information is not intended to replace advice given to you by your health care provider. Make sure you discuss any questions you have with your health care provider. Document Revised: 09/20/2019 Document Reviewed: 09/20/2019 Elsevier Patient Education  [place]. Colonoscopy, Adult, Care After The following information offers guidance on how to care for yourself after your procedure. Your health care provider may also give you more specific  instructions. If you have problems or questions, contact your health care provider. What can I expect after the procedure? After the procedure, it is common to have: A small amount of blood in your stool for 24 hours after the procedure. Some gas. Mild cramping or bloating of your abdomen. Follow these instructions at home: Eating and drinking  Drink enough fluid to keep your urine pale yellow. Follow instructions from your health care provider about eating or drinking restrictions. Resume your normal diet as told by your health care provider. Avoid heavy or fried foods that are hard to digest. Activity Rest as told by your health care provider. Avoid sitting for a long time without moving. Get up to take short walks every  1-2 hours. This is important to improve blood flow and breathing. Ask for help if you feel weak or unsteady. Return to your normal activities as told by your health care provider. Ask your health care provider what activities are safe for you. Managing cramping and bloating  Try walking around when you have cramps or feel bloated. If directed, apply heat to your abdomen as told by your health care provider. Use the heat source that your health care provider recommends, such as a moist heat pack or a heating pad. Place a towel between your skin and the heat source. Leave the heat on for 20-30 minutes. Remove the heat if your skin turns bright red. This is especially important if you are unable to feel pain, heat, or cold. You have a greater risk of getting burned. General instructions If you were given a sedative during the procedure, it can affect you for several hours. Do not drive or operate machinery until your health care provider says that it is safe. For the first 24 hours after the procedure: Do not sign important documents. Do not drink alcohol. Do your regular daily activities at a slower pace than normal. Eat soft foods that are easy to digest. Take  over-the-counter and prescription medicines only as told by your health care provider. Keep all follow-up visits. This is important. Contact a health care provider if: You have blood in your stool 2-3 days after the procedure. Get help right away if: You have more than a small spotting of blood in your stool. You have large blood clots in your stool. You have swelling of your abdomen. You have nausea or vomiting. You have a fever. You have increasing pain in your abdomen that is not relieved with medicine. These symptoms may be an emergency. Get help right away. Call 911. Do not wait to see if the symptoms will go away. Do not drive yourself to the hospital. Summary After the procedure, it is common to have a small amount of blood in your stool. You may also have mild cramping and bloating of your abdomen. If you were given a sedative during the procedure, it can affect you for several hours. Do not drive or operate machinery until your health care provider says that it is safe. Get help right away if you have a lot of blood in your stool, nausea or vomiting, a fever, or increased pain in your abdomen. This information is not intended to replace advice given to you by your health care provider. Make sure you discuss any questions you have with your health care provider. Document Revised: 12/25/2020 Document Reviewed: 12/25/2020 Elsevier Patient Education  [place] After The following information offers guidance on how to care for yourself after your procedure. Your health care provider may also give you more specific instructions. If you have problems or questions, contact your health care provider. What can I expect after the procedure? After the procedure, it is common to have: Tiredness. Little or no memory about what happened during or after the procedure. Impaired judgment when it comes to making decisions. Nausea or vomiting. Some trouble  with balance. Follow these instructions at home: For the time period you were told by your health care provider:  Rest. Do not participate in activities where you could fall or become injured. Do not drive or use machinery. Do not drink alcohol. Do not take sleeping pills or medicines that cause drowsiness. Do not make important decisions or  sign legal documents. Do not take care of children on your own. Medicines Take over-the-counter and prescription medicines only as told by your health care provider. If you were prescribed antibiotics, take them as told by your health care provider. Do not stop using the antibiotic even if you start to feel better. Eating and drinking Follow instructions from your health care provider about what you may eat and drink. Drink enough fluid to keep your urine pale yellow. If you vomit: Drink clear fluids slowly and in small amounts as you are able. Clear fluids include water, ice chips, low-calorie sports drinks, and fruit juice that has water added to it (diluted fruit juice). Eat light and bland foods in small amounts as you are able. These foods include bananas, applesauce, rice, lean meats, toast, and crackers. General instructions  Have a responsible adult stay with you for the time you are told. It is important to have someone help care for you until you are awake and alert. If you have sleep apnea, surgery and some medicines can increase your risk for breathing problems. Follow instructions from your health care provider about wearing your sleep device: When you are sleeping. This includes during daytime naps. While taking prescription pain medicines, sleeping medicines, or medicines that make you drowsy. Do not use any products that contain nicotine or tobacco. These products include cigarettes, chewing tobacco, and vaping devices, such as e-cigarettes. If you need help quitting, ask your health care provider. Contact a health care provider  if: You feel nauseous or vomit every time you eat or drink. You feel light-headed. You are still sleepy or having trouble with balance after 24 hours. You get a rash. You have a fever. You have redness or swelling around the IV site. Get help right away if: You have trouble breathing. You have new confusion after you get home. These symptoms may be an emergency. Get help right away. Call 911. Do not wait to see if the symptoms will go away. Do not drive yourself to the hospital. This information is not intended to replace advice given to you by your health care provider. Make sure you discuss any questions you have with your health care provider. Document Revised: 09/29/2021 Document Reviewed: 09/29/2021 Elsevier Patient Education  [place].

## 2022-05-20 ENCOUNTER — Encounter (HOSPITAL_COMMUNITY): Payer: Self-pay

## 2022-05-20 ENCOUNTER — Telehealth: Payer: Self-pay

## 2022-05-20 ENCOUNTER — Encounter (HOSPITAL_COMMUNITY)
Admission: RE | Admit: 2022-05-20 | Discharge: 2022-05-20 | Disposition: A | Discharge status: Home / Self Care | Payer: Medicare Other | Source: Ambulatory Visit | Attending: Internal Medicine | Admitting: Internal Medicine

## 2022-05-20 ENCOUNTER — Telehealth: Payer: Self-pay | Admitting: *Deleted

## 2022-05-20 DIAGNOSIS — E1169 Type 2 diabetes mellitus with other specified complication: Secondary | ICD-10-CM

## 2022-05-20 NOTE — Telephone Encounter (Signed)
Pt informed of providers message. Verbalized understanding. 

## 2022-05-20 NOTE — Telephone Encounter (Signed)
Per Rennis Harding in [facility] lab, pt canceled his procedure yesterday 05/19/22. MD was made aware by her.

## 2022-05-20 NOTE — Telephone Encounter (Signed)
Pt called to cancel his procedure for 05/25/22. He is having issues with keeping his blood sugars regulated and says he has just been "raked over the coals" with doctor visits last year, so he is just going to cancel and call back when he is ready.

## 2022-05-22 ENCOUNTER — Other Ambulatory Visit: Payer: Medicare Other

## 2022-05-22 ENCOUNTER — Telehealth: Payer: Self-pay

## 2022-05-22 DIAGNOSIS — E1122 Type 2 diabetes mellitus with diabetic chronic kidney disease: Secondary | ICD-10-CM | POA: Diagnosis not present

## 2022-05-22 DIAGNOSIS — I50813 Acute on chronic right heart failure: Secondary | ICD-10-CM | POA: Diagnosis not present

## 2022-05-22 DIAGNOSIS — R809 Proteinuria, unspecified: Secondary | ICD-10-CM | POA: Diagnosis not present

## 2022-05-22 DIAGNOSIS — L97821 Non-pressure chronic ulcer of other part of left lower leg limited to breakdown of skin: Secondary | ICD-10-CM | POA: Diagnosis not present

## 2022-05-22 DIAGNOSIS — I2510 Atherosclerotic heart disease of native coronary artery without angina pectoris: Secondary | ICD-10-CM | POA: Diagnosis not present

## 2022-05-22 DIAGNOSIS — S91301D Unspecified open wound, right foot, subsequent encounter: Secondary | ICD-10-CM | POA: Diagnosis not present

## 2022-05-22 DIAGNOSIS — N189 Chronic kidney disease, unspecified: Secondary | ICD-10-CM | POA: Diagnosis not present

## 2022-05-22 DIAGNOSIS — E1140 Type 2 diabetes mellitus with diabetic neuropathy, unspecified: Secondary | ICD-10-CM | POA: Diagnosis not present

## 2022-05-22 DIAGNOSIS — I480 Paroxysmal atrial fibrillation: Secondary | ICD-10-CM | POA: Diagnosis not present

## 2022-05-22 DIAGNOSIS — I87312 Chronic venous hypertension (idiopathic) with ulcer of left lower extremity: Secondary | ICD-10-CM | POA: Diagnosis not present

## 2022-05-22 DIAGNOSIS — E1129 Type 2 diabetes mellitus with other diabetic kidney complication: Secondary | ICD-10-CM | POA: Diagnosis not present

## 2022-05-22 DIAGNOSIS — I739 Peripheral vascular disease, unspecified: Secondary | ICD-10-CM | POA: Diagnosis not present

## 2022-05-22 DIAGNOSIS — I70244 Atherosclerosis of native arteries of left leg with ulceration of heel and midfoot: Secondary | ICD-10-CM | POA: Diagnosis not present

## 2022-05-22 DIAGNOSIS — L97522 Non-pressure chronic ulcer of other part of left foot with fat layer exposed: Secondary | ICD-10-CM | POA: Diagnosis not present

## 2022-05-22 DIAGNOSIS — I129 Hypertensive chronic kidney disease with stage 1 through stage 4 chronic kidney disease, or unspecified chronic kidney disease: Secondary | ICD-10-CM | POA: Diagnosis not present

## 2022-05-22 NOTE — Telephone Encounter (Signed)
Deanna RN with [facility] called stating that the pt's 3rd digit and plantar area on his L foot has re-opened with some drainage. She wanted to cleanse with NS and secure with a dry dressing.  Reviewed pt's chart, returned call for clarification, no answer, lf vm stating the dressing choice would be appropriate as long as there were no S&S of infection. Pt recently canceled aortogram for 1/2 with Dr. [name] for a threatened L leg bypass graft. Inquired if pt would be rescheduling that soon.

## 2022-05-25 ENCOUNTER — Encounter (HOSPITAL_COMMUNITY): Payer: Self-pay

## 2022-05-25 ENCOUNTER — Ambulatory Visit (HOSPITAL_COMMUNITY): Admit: 2022-05-25 | Payer: Medicare Other

## 2022-05-25 SURGERY — COLONOSCOPY WITH PROPOFOL
Anesthesia: Monitor Anesthesia Care

## 2022-05-26 DIAGNOSIS — I129 Hypertensive chronic kidney disease with stage 1 through stage 4 chronic kidney disease, or unspecified chronic kidney disease: Secondary | ICD-10-CM | POA: Diagnosis not present

## 2022-05-26 DIAGNOSIS — E1129 Type 2 diabetes mellitus with other diabetic kidney complication: Secondary | ICD-10-CM | POA: Diagnosis not present

## 2022-05-26 DIAGNOSIS — D638 Anemia in other chronic diseases classified elsewhere: Secondary | ICD-10-CM | POA: Diagnosis not present

## 2022-05-26 DIAGNOSIS — N189 Chronic kidney disease, unspecified: Secondary | ICD-10-CM | POA: Diagnosis not present

## 2022-05-26 DIAGNOSIS — R809 Proteinuria, unspecified: Secondary | ICD-10-CM | POA: Diagnosis not present

## 2022-05-26 DIAGNOSIS — E1122 Type 2 diabetes mellitus with diabetic chronic kidney disease: Secondary | ICD-10-CM | POA: Diagnosis not present

## 2022-05-26 DIAGNOSIS — I5042 Chronic combined systolic (congestive) and diastolic (congestive) heart failure: Secondary | ICD-10-CM | POA: Diagnosis not present

## 2022-05-26 DIAGNOSIS — D508 Other iron deficiency anemias: Secondary | ICD-10-CM | POA: Diagnosis not present

## 2022-05-26 NOTE — Telephone Encounter (Signed)
Spoke with patient to inquire about rescheduling aortogram procedure that cancelled. Patient verbalized appreciation for Dr. [name] and staff, but stated "I can't handle anymore procedures or appointments right now with any doctor. I am just fed up." Encouraged patient to contact office when ready to schedule. He verbalized understanding.

## 2022-05-27 DIAGNOSIS — I70244 Atherosclerosis of native arteries of left leg with ulceration of heel and midfoot: Secondary | ICD-10-CM | POA: Diagnosis not present

## 2022-05-27 DIAGNOSIS — I739 Peripheral vascular disease, unspecified: Secondary | ICD-10-CM | POA: Diagnosis not present

## 2022-05-27 DIAGNOSIS — I87312 Chronic venous hypertension (idiopathic) with ulcer of left lower extremity: Secondary | ICD-10-CM | POA: Diagnosis not present

## 2022-05-27 DIAGNOSIS — I50813 Acute on chronic right heart failure: Secondary | ICD-10-CM | POA: Diagnosis not present

## 2022-05-27 DIAGNOSIS — S91301D Unspecified open wound, right foot, subsequent encounter: Secondary | ICD-10-CM | POA: Diagnosis not present

## 2022-05-27 DIAGNOSIS — I480 Paroxysmal atrial fibrillation: Secondary | ICD-10-CM | POA: Diagnosis not present

## 2022-05-27 DIAGNOSIS — I2510 Atherosclerotic heart disease of native coronary artery without angina pectoris: Secondary | ICD-10-CM | POA: Diagnosis not present

## 2022-05-27 DIAGNOSIS — L97522 Non-pressure chronic ulcer of other part of left foot with fat layer exposed: Secondary | ICD-10-CM | POA: Diagnosis not present

## 2022-05-27 DIAGNOSIS — L97821 Non-pressure chronic ulcer of other part of left lower leg limited to breakdown of skin: Secondary | ICD-10-CM | POA: Diagnosis not present

## 2022-05-27 DIAGNOSIS — E1140 Type 2 diabetes mellitus with diabetic neuropathy, unspecified: Secondary | ICD-10-CM | POA: Diagnosis not present

## 2022-05-27 NOTE — Progress Notes (Signed)
Fax received for medical clearance/medication hold for Dr. [name].  Provider signed, form faxed back to sender, verified successful, sent to scan center.  

## 2022-06-01 ENCOUNTER — Other Ambulatory Visit: Payer: Self-pay | Admitting: Family

## 2022-06-01 ENCOUNTER — Ambulatory Visit: Payer: Self-pay

## 2022-06-01 NOTE — Patient Instructions (Addendum)
Visit Information  Thank you for taking time to visit with me today. Please don't hesitate to contact me if I can be of assistance to you.   Following are the goals we discussed today:   Goals Addressed               This Visit's Progress     Fall [facility]) (pt-stated)        Care Coordination Interventions: Provided written and verbal education re: potential causes of falls and Fall prevention strategies Advised patient of importance of notifying provider of falls Assessed for falls since last encounter No falls reported      improved uncontrolled diabetes (THN) (pt-stated)        Care Coordination Interventions: Provided education to patient about basic DM disease process Reviewed medications with patient and discussed importance of medication adherence Discussed plans with patient for ongoing care management follow up and provided patient with direct contact information for care management team Discussed the importance of providing him food he prefers to improve his appetite Confirmed he does not see an endocrinologist  Blood sugars under better control per patient.  Blood sugar range 120-160 per patient.      manage atrial fibrillation (THN) (pt-stated)        Care Coordination Interventions: Reviewed importance of adherence to anticoagulant exactly as prescribed Afib action plan reviewed Screening for signs and symptoms of depression related to chronic disease state  Assessed social determinant of health barriers Patient reports no recent episodes.  Reports compliance with blood thinner.         Your next appointment is by telephone on 07/01/22 at 1000  Please call the care guide team at [phone] if you need to cancel or reschedule your appointment.   If you are experiencing a Mental Health or [place] or need someone to talk to, please call the Suicide and Crisis Lifeline: 988   Patient verbalizes understanding of instructions and care plan  provided today and agrees to view in [place]. Active MyChart status and patient understanding of how to access instructions and care plan via MyChart confirmed with patient.     Telephone follow up appointment with care management team member scheduled for: February  Marilou Barnfield J Lorette Peterkin, RN, MSN [place] Management Care Management Coordinator Direct Line [phone]

## 2022-06-01 NOTE — Patient Outreach (Signed)
  Care Coordination   Follow Up Visit Note   06/01/2022 Name: [name] [name] MRN: [id number] DOB: [birth year]  [name] [name] is a 72 y.o. year old male who sees [facility], Theador Hawthorne, FNP for primary care. I spoke with  [name] [name] by phone today.  What matters to the patients health and wellness today?  none    Goals Addressed               This Visit's Progress     Fall ([place]) (pt-stated)        Care Coordination Interventions: Provided written and verbal education re: potential causes of falls and Fall prevention strategies Advised patient of importance of notifying provider of falls Assessed for falls since last encounter No falls reported      improved uncontrolled diabetes (THN) (pt-stated)        Care Coordination Interventions: Provided education to patient about basic DM disease process Reviewed medications with patient and discussed importance of medication adherence Discussed plans with patient for ongoing care management follow up and provided patient with direct contact information for care management team Discussed the importance of providing him food he prefers to improve his appetite Confirmed he does not see an endocrinologist  Blood sugars under better control per patient.  Blood sugar range 120-160 per patient.      manage atrial fibrillation (THN) (pt-stated)        Care Coordination Interventions: Reviewed importance of adherence to anticoagulant exactly as prescribed Afib action plan reviewed Screening for signs and symptoms of depression related to chronic disease state  Assessed social determinant of health barriers Patient reports no recent episodes.  Reports compliance with blood thinner.         SDOH assessments and interventions completed:  Yes     Care Coordination Interventions:  Yes, provided   Follow up plan: Follow up call scheduled for February    Encounter Outcome:  Pt. Visit Completed   Jone Baseman, RN, MSN [place]  Management Care Management Coordinator Direct Line [phone]

## 2022-06-02 DIAGNOSIS — I480 Paroxysmal atrial fibrillation: Secondary | ICD-10-CM | POA: Diagnosis not present

## 2022-06-02 DIAGNOSIS — E1140 Type 2 diabetes mellitus with diabetic neuropathy, unspecified: Secondary | ICD-10-CM | POA: Diagnosis not present

## 2022-06-02 DIAGNOSIS — I50813 Acute on chronic right heart failure: Secondary | ICD-10-CM | POA: Diagnosis not present

## 2022-06-02 DIAGNOSIS — L97522 Non-pressure chronic ulcer of other part of left foot with fat layer exposed: Secondary | ICD-10-CM | POA: Diagnosis not present

## 2022-06-02 DIAGNOSIS — I87312 Chronic venous hypertension (idiopathic) with ulcer of left lower extremity: Secondary | ICD-10-CM | POA: Diagnosis not present

## 2022-06-02 DIAGNOSIS — I70244 Atherosclerosis of native arteries of left leg with ulceration of heel and midfoot: Secondary | ICD-10-CM | POA: Diagnosis not present

## 2022-06-02 DIAGNOSIS — I2510 Atherosclerotic heart disease of native coronary artery without angina pectoris: Secondary | ICD-10-CM | POA: Diagnosis not present

## 2022-06-02 DIAGNOSIS — S91301D Unspecified open wound, right foot, subsequent encounter: Secondary | ICD-10-CM | POA: Diagnosis not present

## 2022-06-02 DIAGNOSIS — L97821 Non-pressure chronic ulcer of other part of left lower leg limited to breakdown of skin: Secondary | ICD-10-CM | POA: Diagnosis not present

## 2022-06-02 DIAGNOSIS — I739 Peripheral vascular disease, unspecified: Secondary | ICD-10-CM | POA: Diagnosis not present

## 2022-06-07 ENCOUNTER — Other Ambulatory Visit: Payer: Self-pay | Admitting: Family

## 2022-06-08 ENCOUNTER — Ambulatory Visit: Payer: Medicare Other | Admitting: Surgery

## 2022-06-08 ENCOUNTER — Encounter (HOSPITAL_COMMUNITY): Payer: Medicare Other

## 2022-06-16 ENCOUNTER — Ambulatory Visit (INDEPENDENT_AMBULATORY_CARE_PROVIDER_SITE_OTHER): Payer: Medicare Other | Admitting: Family

## 2022-06-16 ENCOUNTER — Encounter: Payer: Self-pay | Admitting: Family

## 2022-06-16 VITALS — BP 128/62 | HR 59 | Temp 97.0°F | Ht 72.0 in

## 2022-06-16 DIAGNOSIS — Z794 Long term (current) use of insulin: Secondary | ICD-10-CM

## 2022-06-16 DIAGNOSIS — R197 Diarrhea, unspecified: Secondary | ICD-10-CM | POA: Diagnosis not present

## 2022-06-16 DIAGNOSIS — I5020 Unspecified systolic (congestive) heart failure: Secondary | ICD-10-CM

## 2022-06-16 DIAGNOSIS — Z91199 Patient's noncompliance with other medical treatment and regimen due to unspecified reason: Secondary | ICD-10-CM

## 2022-06-16 DIAGNOSIS — F419 Anxiety disorder, unspecified: Secondary | ICD-10-CM

## 2022-06-16 DIAGNOSIS — K5900 Constipation, unspecified: Secondary | ICD-10-CM

## 2022-06-16 DIAGNOSIS — I152 Hypertension secondary to endocrine disorders: Secondary | ICD-10-CM | POA: Diagnosis not present

## 2022-06-16 DIAGNOSIS — I110 Hypertensive heart disease with heart failure: Secondary | ICD-10-CM

## 2022-06-16 DIAGNOSIS — F331 Major depressive disorder, recurrent, moderate: Secondary | ICD-10-CM

## 2022-06-16 DIAGNOSIS — E0842 Diabetes mellitus due to underlying condition with diabetic polyneuropathy: Secondary | ICD-10-CM | POA: Diagnosis not present

## 2022-06-16 DIAGNOSIS — E1169 Type 2 diabetes mellitus with other specified complication: Secondary | ICD-10-CM | POA: Diagnosis not present

## 2022-06-16 DIAGNOSIS — D509 Iron deficiency anemia, unspecified: Secondary | ICD-10-CM | POA: Diagnosis not present

## 2022-06-16 DIAGNOSIS — G720 Drug-induced myopathy: Secondary | ICD-10-CM

## 2022-06-16 DIAGNOSIS — S81802D Unspecified open wound, left lower leg, subsequent encounter: Secondary | ICD-10-CM

## 2022-06-16 DIAGNOSIS — I70209 Unspecified atherosclerosis of native arteries of extremities, unspecified extremity: Secondary | ICD-10-CM

## 2022-06-16 DIAGNOSIS — R531 Weakness: Secondary | ICD-10-CM

## 2022-06-16 DIAGNOSIS — E1159 Type 2 diabetes mellitus with other circulatory complications: Secondary | ICD-10-CM

## 2022-06-16 DIAGNOSIS — E785 Hyperlipidemia, unspecified: Secondary | ICD-10-CM

## 2022-06-16 DIAGNOSIS — I2510 Atherosclerotic heart disease of native coronary artery without angina pectoris: Secondary | ICD-10-CM

## 2022-06-16 DIAGNOSIS — Z9861 Coronary angioplasty status: Secondary | ICD-10-CM

## 2022-06-16 DIAGNOSIS — T466X5A Adverse effect of antihyperlipidemic and antiarteriosclerotic drugs, initial encounter: Secondary | ICD-10-CM

## 2022-06-16 LAB — BAYER DCA HB A1C WAIVED: HB A1C (BAYER DCA - WAIVED): 9.1 % — ABNORMAL HIGH (ref 4.8–5.6)

## 2022-06-16 LAB — HEMOGLOBIN, FINGERSTICK: Hemoglobin: 11.6 g/dL — ABNORMAL LOW (ref 12.6–17.7)

## 2022-06-16 NOTE — Patient Instructions (Signed)
Diarrhea, Adult Diarrhea is frequent loose and sometimes watery bowel movements. Diarrhea can make you feel weak and cause you to become dehydrated. Dehydration is a condition in which there is not enough water or other fluids in the body. Dehydration can make you tired and thirsty, cause you to have a dry mouth, and decrease how often you urinate. Diarrhea typically lasts 2-3 days. However, it can last longer if it is a sign of something more serious. It is important to treat your diarrhea as told by your health care provider. Follow these instructions at home: Eating and drinking     Follow these recommendations as told by your health care provider: Take an oral rehydration solution (ORS). This is an over-the-counter medicine that helps return your body to its normal balance of nutrients and water. It is found at pharmacies and retail stores. Drink enough fluid to keep your urine pale yellow. Drink fluids such as water, diluted fruit juice, and low-calorie sports drinks. You can drink milk also, if desired. Sucking on ice chips is another way to get fluids. Avoid drinking fluids that contain a lot of sugar or caffeine, such as soda, energy drinks, and regular sports drinks. Avoid alcohol. Eat bland, easy-to-digest foods in small amounts as you are able. These foods include bananas, applesauce, rice, lean meats, toast, and crackers. Avoid spicy or fatty foods.  Medicines Take over-the-counter and prescription medicines only as told by your health care provider. If you were prescribed antibiotics, take them as told by your health care provider. Do not stop using the antibiotic even if you start to feel better. General instructions  Wash your hands often using soap and water for at least 20 seconds. If soap and water are not available, use hand sanitizer. Others in the household should wash their hands as well. Hands should be washed: After using the toilet or changing a diaper. Before  preparing, cooking, or serving food. While caring for a sick person or while visiting someone in a hospital. Rest at home while you recover. Take a warm bath to relieve any burning or pain from frequent diarrhea episodes. Watch your condition for any changes. Contact a health care provider if: You have a fever. Your diarrhea gets worse. You have new symptoms. You vomit every time you eat or drink. You feel light-headed, dizzy, or have a headache. You have muscle cramps. You have signs of dehydration, such as: Dark urine, very little urine, or no urine. Cracked lips. Dry mouth. Sunken eyes. Sleepiness. Weakness. You have bloody or black stools or stools that look like tar. You have severe pain, cramping, or bloating in your abdomen. Your skin feels cold and clammy. You feel confused. Get help right away if: You have chest pain or your heart is beating very quickly. You have trouble breathing or you are breathing very quickly. You feel extremely weak or you faint. These symptoms may be an emergency. Get help right away. Call 911. Do not wait to see if the symptoms will go away. Do not drive yourself to the hospital. This information is not intended to replace advice given to you by your health care provider. Make sure you discuss any questions you have with your health care provider. Document Revised: 10/21/2021 Document Reviewed: 10/21/2021 Elsevier Patient Education  [place].

## 2022-06-16 NOTE — Progress Notes (Signed)
Subjective:    Patient ID: [name] [name], male    DOB: 1951-04-29, 72 y.o.   MRN: [id number]  Chief Complaint  Patient presents with   Medical Management of Chronic Issues   Diarrhea    Off and on for about a week since he last ate fish    PT presents to the office today for chronic follow up. PT had NSTEMI on 07/30/16 and 05/23/20 and stent placed. Pt is followed by Cardiologists every 4 months for CAD and decreased EF. He had a cath 08/08/21 that was showed no blockage.    He has been hospitalized twice for sepsis. He was followed by ID, but has stopped seeing them.    Pt currently in a clinical trial for hyperlipidemia. He can not tolerate statins related to muscle pain.  He is followed by Ortho for osteoarthritis and is currently getting injections in bilateral knees and back. Pt is followed by Pain Management every 2-3 months. Followed by Nephrologists every 3 months for CKD.    He is followed by Vascular and had stent placed in his right leg and had a left femoral-popliteal  bypass on 11/22/20. He had a abdominal aortogram with lower extremity on 01/28/21.   He is followed by Vascular surgery and has a bypass 08/15/21. He has several vascular wounds on bilateral feet. Has Home Health once a week for wound care. Improving.    He is morbid obese with a BMI of 36 and diabetic and HTN.  Diarrhea  This is a new problem. The current episode started in the past 7 days. The problem occurs 2 to 4 times per day. The problem has been waxing and waning. The stool consistency is described as Watery. The patient states that diarrhea awakens him from sleep. He has tried nothing for the symptoms.  Hypertension This is a chronic problem. The current episode started more than 1 year ago. The problem has been resolved since onset. The problem is controlled. Associated symptoms include shortness of breath. Pertinent negatives include no peripheral edema. Risk factors for coronary artery disease include  dyslipidemia, obesity and male gender. The current treatment provides moderate improvement.  Congestive Heart Failure Presents for follow-up visit. Associated symptoms include shortness of breath. The symptoms have been stable.  Gastroesophageal Reflux He complains of belching and a hoarse voice. This is a chronic problem. The current episode started more than 1 year ago. The problem occurs occasionally. He has tried a PPI for the symptoms. The treatment provided moderate relief.  Hyperlipidemia This is a chronic problem. The current episode started more than 1 year ago. The problem is controlled. Recent lipid tests were reviewed and are normal. Exacerbating diseases include obesity. Associated symptoms include shortness of breath. The current treatment provides moderate improvement of lipids. Risk factors for coronary artery disease include dyslipidemia, diabetes mellitus, hypertension, male sex and a sedentary lifestyle.  Diabetes He presents for his follow-up diabetic visit. He has type 2 diabetes mellitus. Symptoms are stable. Risk factors for coronary artery disease include dyslipidemia, diabetes mellitus, hypertension and sedentary lifestyle. He is following a generally unhealthy diet. His overall blood glucose range is 140-180 mg/dl.  Anemia Presents for follow-up visit.  Depression        This is a chronic problem.  The current episode started more than 1 year ago.   Associated symptoms include helplessness, hopelessness and sad.  Past treatments include SNRIs - Serotonin and norepinephrine reuptake inhibitors. Anxiety Presents for follow-up visit. Symptoms include excessive  worry, irritability and shortness of breath. Symptoms occur occasionally.   His past medical history is significant for anemia.      Review of Systems  Constitutional:  Positive for irritability.  HENT:  Positive for hoarse voice.   Respiratory:  Positive for shortness of breath.   All other systems reviewed and  are negative.      Objective:   Physical Exam Vitals reviewed.  Constitutional:      General: He is not in acute distress.    Appearance: He is well-developed. He is obese.  HENT:     Head: Normocephalic.     Right Ear: Tympanic membrane normal.     Left Ear: Tympanic membrane normal.  Eyes:     General:        Right eye: No discharge.        Left eye: No discharge.     Pupils: Pupils are equal, round, and reactive to light.  Neck:     Thyroid: No thyromegaly.  Cardiovascular:     Rate and Rhythm: Normal rate and regular rhythm.     Heart sounds: Normal heart sounds. No murmur heard. Pulmonary:     Effort: Pulmonary effort is normal. No respiratory distress.     Breath sounds: Normal breath sounds. No wheezing.  Abdominal:     General: Bowel sounds are normal. There is no distension.     Palpations: Abdomen is soft.     Tenderness: There is no abdominal tenderness.  Musculoskeletal:        General: No tenderness. Normal range of motion.     Cervical back: Normal range of motion and neck supple.  Skin:    General: Skin is warm and dry.     Findings: No erythema or rash.  Neurological:     Mental Status: He is alert and oriented to person, place, and time.     Cranial Nerves: No cranial nerve deficit.     Motor: Weakness present.     Gait: Gait abnormal.     Deep Tendon Reflexes: Reflexes are normal and symmetric.  Psychiatric:        Behavior: Behavior normal.        Thought Content: Thought content normal.        Judgment: Judgment normal.       BP 128/62   Pulse (!) 59   Temp (!) 97 F (36.1 C) (Temporal)   Ht 6' (1.829 m)   BMI 36.21 kg/m      Assessment & Plan:  [name] [name] comes in today with chief complaint of Medical Management of Chronic Issues and Diarrhea (Off and on for about a week since he last ate fish )   Diagnosis and orders addressed:  1. Anxiety - CMP14+EGFR  2. Atherosclerosis of arteries of extremities (HCC) - CMP14+EGFR  3.  CAD S/P percutaneous coronary angioplasty - CMP14+EGFR  4. Systolic congestive heart failure, unspecified HF chronicity (HCC) - CMP14+EGFR  5. Constipation, unspecified constipation type - CMP14+EGFR  6. Moderate episode of recurrent major depressive disorder (HCC) - CMP14+EGFR  7. Type 2 diabetes mellitus with other specified complication, with long-term current use of insulin (HCC) - CMP14+EGFR - Bayer DCA Hb A1c Waived  8. Statin myopathy - CMP14+EGFR  9. Diabetic polyneuropathy associated with diabetes mellitus due to underlying condition ([place]) - CMP14+EGFR  10. Diarrhea of presumed infectious origin - CMP14+EGFR - Hemoglobin, fingerstick  11. Generalized weakness - CMP14+EGFR  12. Hyperlipidemia associated with type 2 diabetes mellitus (  [place]) - CMP14+EGFR  13. Hypertension associated with diabetes ([place]) - CMP14+EGFR  14. Iron deficiency anemia, unspecified iron deficiency anemia type - Anemia Profile B - CMP14+EGFR - Hemoglobin, fingerstick  15. Wound of left lower extremity, subsequent encounter - CMP14+EGFR  16. Medically noncompliant - CMP14+EGFR   Labs pending Health Maintenance reviewed Diet and exercise encouraged  Follow up plan: 3 months    [name], FNP

## 2022-06-17 DIAGNOSIS — L97522 Non-pressure chronic ulcer of other part of left foot with fat layer exposed: Secondary | ICD-10-CM | POA: Diagnosis not present

## 2022-06-17 DIAGNOSIS — E1140 Type 2 diabetes mellitus with diabetic neuropathy, unspecified: Secondary | ICD-10-CM | POA: Diagnosis not present

## 2022-06-17 DIAGNOSIS — I739 Peripheral vascular disease, unspecified: Secondary | ICD-10-CM | POA: Diagnosis not present

## 2022-06-17 DIAGNOSIS — L97821 Non-pressure chronic ulcer of other part of left lower leg limited to breakdown of skin: Secondary | ICD-10-CM | POA: Diagnosis not present

## 2022-06-17 DIAGNOSIS — I70244 Atherosclerosis of native arteries of left leg with ulceration of heel and midfoot: Secondary | ICD-10-CM | POA: Diagnosis not present

## 2022-06-17 DIAGNOSIS — I87312 Chronic venous hypertension (idiopathic) with ulcer of left lower extremity: Secondary | ICD-10-CM | POA: Diagnosis not present

## 2022-06-17 DIAGNOSIS — I2510 Atherosclerotic heart disease of native coronary artery without angina pectoris: Secondary | ICD-10-CM | POA: Diagnosis not present

## 2022-06-17 DIAGNOSIS — I480 Paroxysmal atrial fibrillation: Secondary | ICD-10-CM | POA: Diagnosis not present

## 2022-06-17 DIAGNOSIS — S91301D Unspecified open wound, right foot, subsequent encounter: Secondary | ICD-10-CM | POA: Diagnosis not present

## 2022-06-17 DIAGNOSIS — I50813 Acute on chronic right heart failure: Secondary | ICD-10-CM | POA: Diagnosis not present

## 2022-06-17 LAB — CMP14+EGFR
ALT: 12 IU/L (ref 0–44)
AST: 19 IU/L (ref 0–40)
Albumin/Globulin Ratio: 1.4 (ref 1.2–2.2)
Albumin: 4.4 g/dL (ref 3.8–4.8)
Alkaline Phosphatase: 55 IU/L (ref 44–121)
BUN/Creatinine Ratio: 20 (ref 10–24)
BUN: 32 mg/dL — ABNORMAL HIGH (ref 8–27)
Bilirubin Total: 0.5 mg/dL (ref 0.0–1.2)
CO2: 24 mmol/L (ref 20–29)
Calcium: 10 mg/dL (ref 8.6–10.2)
Chloride: 95 mmol/L — ABNORMAL LOW (ref 96–106)
Creatinine, Ser: 1.63 mg/dL — ABNORMAL HIGH (ref 0.76–1.27)
Globulin, Total: 3.2 g/dL (ref 1.5–4.5)
Glucose: 201 mg/dL — ABNORMAL HIGH (ref 70–99)
Potassium: 4.5 mmol/L (ref 3.5–5.2)
Sodium: 136 mmol/L (ref 134–144)
Total Protein: 7.6 g/dL (ref 6.0–8.5)
eGFR: 45 mL/min/{1.73_m2} — ABNORMAL LOW (ref >59)

## 2022-06-17 LAB — ANEMIA PROFILE B
Basophils Absolute: 0.1 10*3/uL (ref 0.0–0.2)
Basos: 1 %
EOS (ABSOLUTE): 0.1 10*3/uL (ref 0.0–0.4)
Eos: 2 %
Ferritin: 82 ng/mL (ref 30–400)
Folate: 19 ng/mL (ref >3.0)
Hematocrit: 38.9 % (ref 37.5–51.0)
Hemoglobin: 11.7 g/dL — ABNORMAL LOW (ref 13.0–17.7)
Immature Grans (Abs): 0 10*3/uL (ref 0.0–0.1)
Immature Granulocytes: 0 %
Iron Saturation: 12 % — ABNORMAL LOW (ref 15–55)
Iron: 49 ug/dL (ref 38–169)
Lymphocytes Absolute: 1.5 10*3/uL (ref 0.7–3.1)
Lymphs: 21 %
MCH: 23.3 pg — ABNORMAL LOW (ref 26.6–33.0)
MCHC: 30.1 g/dL — ABNORMAL LOW (ref 31.5–35.7)
MCV: 78 fL — ABNORMAL LOW (ref 79–97)
Monocytes Absolute: 0.8 10*3/uL (ref 0.1–0.9)
Monocytes: 11 %
Neutrophils Absolute: 4.7 10*3/uL (ref 1.4–7.0)
Neutrophils: 65 %
Platelets: 316 10*3/uL (ref 150–450)
RBC: 5.02 x10E6/uL (ref 4.14–5.80)
RDW: 19.6 % — ABNORMAL HIGH (ref 11.6–15.4)
Retic Ct Pct: 2.1 % (ref 0.6–2.6)
Total Iron Binding Capacity: 419 ug/dL (ref 250–450)
UIBC: 370 ug/dL — ABNORMAL HIGH (ref 111–343)
Vitamin B-12: 482 pg/mL (ref 232–1245)
WBC: 7.2 10*3/uL (ref 3.4–10.8)

## 2022-06-18 ENCOUNTER — Encounter: Payer: Self-pay | Admitting: Internal Medicine

## 2022-06-18 ENCOUNTER — Ambulatory Visit: Payer: Medicare Other | Attending: Internal Medicine | Admitting: Internal Medicine

## 2022-06-18 ENCOUNTER — Other Ambulatory Visit: Payer: Self-pay | Admitting: Family

## 2022-06-18 ENCOUNTER — Ambulatory Visit: Payer: Medicare Other | Admitting: Internal Medicine

## 2022-06-18 VITALS — BP 114/70 | HR 40 | Ht 75.0 in | Wt 278.0 lb

## 2022-06-18 DIAGNOSIS — I480 Paroxysmal atrial fibrillation: Secondary | ICD-10-CM

## 2022-06-18 MED ORDER — TRESIBA FLEXTOUCH 200 UNIT/ML ~~LOC~~ SOPN: 20.0000 [IU] | PEN_INJECTOR | Freq: Every evening | SUBCUTANEOUS | 2 refills | Status: DC

## 2022-06-18 NOTE — Patient Instructions (Signed)
Medication Instructions:  Your physician recommends that you continue on your current medications as directed. Please refer to the Current Medication list given to you today.  *If you need a refill on your cardiac medications before your next appointment, please call your pharmacy*   Lab Work: NONE   If you have labs (blood work) drawn today and your tests are completely normal, you will receive your results only by: MyChart Message (if you have MyChart) OR A paper copy in the mail If you have any lab test that is abnormal or we need to change your treatment, we will call you to review the results.   Testing/Procedures: NONE    Follow-Up: At [facility], you and your health needs are our priority.  As part of our continuing mission to provide you with exceptional heart care, we have created designated Provider Care Teams.  These Care Teams include your primary Cardiologist (physician) and Advanced Practice Providers (APPs -  Physician Assistants and Nurse Practitioners) who all work together to provide you with the care you need, when you need it.  We recommend signing up for the patient portal called "MyChart".  Sign up information is provided on this After Visit Summary.  MyChart is used to connect with patients for Virtual Visits (Telemedicine).  Patients are able to view lab/test results, encounter notes, upcoming appointments, etc.  Non-urgent messages can be sent to your provider as well.   To learn more about what you can do with MyChart, go to [email or web address].    Your next appointment:   1 year(s)  Provider:   Gregg Taylor, MD    Other Instructions Thank you for choosing [facility]!    

## 2022-06-18 NOTE — Progress Notes (Signed)
HPI [name] returns for followup of PAF. He is a pleasant but obese 72 yo man with a h/o CAD, HTN, and diastolic CHF. He developed atrial fib with a RVR and I recommended he start amiodarone. His palpitations resolved. He began to feel badly and stopped the amiodarone. He has remained off amio and has had only a couple of episodes of palpitations. He is still weak. He denies syncope. He is losing weight.  He has a bad knee but is trying to avoid surgery. Allergies  Allergen Reactions   Shellfish Allergy Anaphylaxis, Hives and Swelling    Tongue swelling    Sulfa Antibiotics Anaphylaxis, Hives and Swelling    Tongue swelling   Ace Inhibitors Other (See Comments) and Cough    CKD, renal failure    Invokana [Canagliflozin] Other (See Comments)    Syncope Dehydration    Lexapro [Escitalopram] Other (See Comments)    Buzzing in ears Headaches "Felt like a zombie"   Metformin And Related Itching   Pravachol [Pravastatin Sodium] Other (See Comments)    Myalgias    Repatha [Evolocumab] Other (See Comments)    Myalgias Flu like symptoms    Tricor [Fenofibrate] Other (See Comments)    Myalgias     Zestril [Lisinopril] Cough   Farxiga [Dapagliflozin] Other (See Comments)    Nephro d/c'd   Crestor [Rosuvastatin] Other (See Comments)    Myalgias    Horse-Derived Products Rash    horse serum   Lipitor [Atorvastatin] Other (See Comments)    Myalgias    Livalo [Pitavastatin] Other (See Comments)    Myalgias    Milk (Cow) Diarrhea and Nausea Only    Stomach upset    Tape Rash     Current Outpatient Medications  Medication Sig Dispense Refill   albuterol (VENTOLIN HFA) 108 (90 Base) MCG/ACT inhaler Inhale 2 puffs into the lungs every 6 (six) hours as needed for wheezing or shortness of breath. 8.5 g 0   apixaban (ELIQUIS) 5 MG TABS tablet TAKE ONE (1) TABLET BY MOUTH TWICE DAILY 60 tablet 2   Calcium Carbonate Antacid (ALKA-SELTZER ANTACID PO) Take 1 tablet by mouth 2  (two) times daily as needed (heartburn, indigestion).     clopidogrel (PLAVIX) 75 MG tablet TAKE 1 TABLET BY MOUTH DAILY 30 tablet 10   DULoxetine (CYMBALTA) 30 MG capsule Take 3 capsules (90 mg total) by mouth daily. 120 capsule 1   ezetimibe (ZETIA) 10 MG tablet TAKE 1 TABLET BY MOUTH DAILY 90 tablet 3   fenofibrate 160 MG tablet TAKE 1 TABLET BY MOUTH DAILY 30 tablet 2   FEROSUL 325 (65 Fe) MG tablet Take 325 mg by mouth every morning.     furosemide (LASIX) 20 MG tablet TAKE ONE (1) TABLET BY MOUTH TWICE DAILY FOR 7 DAYS THEN TAKE 1 TABLET BY MOUTH DAILY 30 tablet 2   gabapentin (NEURONTIN) 300 MG capsule Take 3 capsules (900 mg total) by mouth 2 (two) times daily. 120 capsule 6   HYDROcodone-acetaminophen (NORCO) 10-325 MG tablet Take 1 tablet by mouth every 4 (four) hours as needed for moderate pain. 30 tablet 0   insulin degludec (TRESIBA FLEXTOUCH) 200 UNIT/ML FlexTouch Pen Inject 20 Units into the skin at bedtime. 9 mL 2   insulin lispro (HUMALOG) 100 UNIT/ML KwikPen INJECT 15-20 UNITS SUBCUTANEOUSLY THREE TIMES A DAY WITH MEALS (Patient taking differently: Inject 15 Units into the skin 3 (three) times daily before meals.) 15 mL 10  insulin NPH-regular Human (HUMULIN 70/30) (70-30) 100 UNIT/ML injection INJECT 60 UNITS INTO THE SKIN 2 (TWO) TIMES DAILY WITH A MEAL. 30 mL 0   isosorbide mononitrate (IMDUR) 30 MG 24 hr tablet TAKE 1 TABLET BY MOUTH DAILY 30 tablet 10   Menthol, Topical Analgesic, (BLUE-EMU MAXIMUM STRENGTH EX) Apply 1 application  topically daily as needed (pain).     metolazone (ZAROXOLYN) 5 MG tablet Take 5 mg by mouth daily.     metoprolol succinate (TOPROL-XL) 50 MG 24 hr tablet Take 1 tablet (50 mg total) by mouth daily. 30 tablet 3   metoprolol tartrate (LOPRESSOR) 50 MG tablet Take 50 mg by mouth daily as needed (atrial fibrillation).     nitroGLYCERIN (NITROSTAT) 0.4 MG SL tablet DISSOLVE ONE TABLET UNDER THE TONGUE EVERY 5 MINUTES AS NEEDED FOR CHEST PAIN.  DO NOT  EXCEED A TOTAL OF 3 DOSES IN 15 MINUTES Strength: 0.4 mg 25 tablet 0   pantoprazole (PROTONIX) 40 MG tablet Take 1 tablet (40 mg total) by mouth 2 (two) times daily. (Patient taking differently: Take 40 mg by mouth daily.) 60 tablet 0   tirzepatide (MOUNJARO) 10 MG/0.5ML Pen Inject 10 mg into the skin once a week. 6 mL 2   TRUEPLUS INSULIN SYRINGE 28G X 1/2" 1 ML MISC USE TO GIVE INSULIN FIVE TIMES DAILY 100 each 10   No current facility-administered medications for this visit.     Past Medical History:  Diagnosis Date   Anxiety    Arthritis    Atrial fibrillation (HCC)    CAD (coronary artery disease)    a. 2010: DES to CTO of RCA. EF 55% b. 07/2016: cath showing total occlusion within previously placed RCA stent (collaterals present), severe stenosis along LCx and OM1 (treated with 2 overlapping DES). c. repeat cath in 01/2018 showing patent stents along LCx and OM with CTO of D2, CTO of distal LCx, and CTO of RCA with collaterals present overall unchanged since 2018 with medical management recom   Cellulitis and abscess rt groin    Complication of anesthesia    " I woke up during a colonoscopy "      Depression    Diabetes mellitus    Diastolic CHF ([place])    Disorders of iron metabolism    Dysrhythmia    Fibromyalgia    GERD (gastroesophageal reflux disease)    History of hiatal hernia    Hyperlipidemia    Hypertension    Low serum testosterone level    Medically noncompliant    Myocardial infarction ([place])    05-23-20   Pneumonia    Sepsis ([place])    2023    ROS:   All systems reviewed and negative except as noted in the HPI.   Past Surgical History:  Procedure Laterality Date   ABDOMINAL AORTOGRAM W/LOWER EXTREMITY N/A 03/21/2019   Procedure: ABDOMINAL AORTOGRAM W/LOWER EXTREMITY;  Surgeon: [name], MD;  Location: [facility];  Service: Cardiovascular;  Laterality: N/A;   ABDOMINAL AORTOGRAM W/LOWER EXTREMITY N/A 11/12/2020   Procedure: ABDOMINAL  AORTOGRAM W/LOWER EXTREMITY;  Surgeon: [name], MD;  Location: [facility];  Service: Cardiovascular;  Laterality: N/A;   ABDOMINAL AORTOGRAM W/LOWER EXTREMITY N/A 01/28/2021   Procedure: ABDOMINAL AORTOGRAM W/LOWER EXTREMITY;  Surgeon: [name], MD;  Location: [facility];  Service: Cardiovascular;  Laterality: N/A;   ABDOMINAL AORTOGRAM W/LOWER EXTREMITY Bilateral 07/29/2021   Procedure: ABDOMINAL AORTOGRAM W/LOWER EXTREMITY;  Surgeon: Harold Barban  W, MD;  Location: [facility];  Service: Cardiovascular;  Laterality: Bilateral;   ANGIOPLASTY N/A 08/15/2021   Procedure: ATTEMPTED RIGHT PERONEAL  ANGIOPLASTY, LEFT PERONEAL ANGIOPLASTY;  Surgeon: [name], MD;  Location: [facility];  Service: Vascular;  Laterality: N/A;   BACK SURGERY  2015   ACDF by Dr. Orland Jarred STUDY  12/23/2021   Procedure: BUBBLE STUDY;  Surgeon: Elouise Munroe, MD;  Location: [facility];  Service: Cardiology;;   COLONOSCOPY N/A 10/01/2014   Dr. [name]: multiple tubular adenomas removed, colonic diverticulosis, redundant colon. next tcs advised for 09/2017. PATIENT NEEDS PROPOFOL FOR FAILED CONSCIOUS SEDATION   CORONARY STENT INTERVENTION N/A 07/30/2016   Procedure: Coronary Stent Intervention;  Surgeon: [name], MD;  Location: [facility] [facility];  Service: Cardiovascular;  Laterality: N/A;   CORONARY STENT PLACEMENT  2000   By Dr. Olevia Perches   EP IMPLANTABLE DEVICE N/A 05/25/2016   Procedure: Loop Recorder Insertion;  Surgeon: [name], MD;  Location: [facility] [facility];  Service: Cardiovascular;  Laterality: N/A;   ESOPHAGOGASTRODUODENOSCOPY     esophagus [facility] at [facility]   ESOPHAGOGASTRODUODENOSCOPY N/A 10/01/2014   Dr. [name]: patchy mottling/erythema and minimal polypoid appearance of gastric mucosa. bx with mild inlammation but no H.pylori   FEMORAL-POPLITEAL BYPASS GRAFT Left 11/22/2020   Procedure: LEFT FEMORAL-POPLITEAL BYPASS GRAFT;  Surgeon: [name], MD;  [facility];  Service: Vascular;  Laterality: Left;   FEMORAL-POPLITEAL BYPASS GRAFT Left 08/15/2021   Procedure: REDO LEFT FEMORAL-POPLITEAL BYPASS USING PROPATEN GRAFT;  Surgeon: [name], MD;  Location: [place];  Service: Vascular;  Laterality: Left;   HERNIA REPAIR  1448   umbilical   INSERTION OF ILIAC STENT Right 11/22/2020   Procedure: INSERTION OF ELUVIA STENT INTO RIGHT DISTAL SUPERFICIAL FEMORAL ARTERY;  Surgeon: [name], MD;  Location: [facility];  Service: Vascular;  Laterality: Right;   LEFT HEART CATH AND CORONARY ANGIOGRAPHY N/A 07/30/2016   Procedure: Left Heart Cath and Coronary Angiography;  Surgeon: [name], MD;  Location: [place] [facility];  Service: Cardiovascular;  Laterality: N/A;   LEFT HEART CATH AND CORONARY ANGIOGRAPHY N/A 01/19/2018   Procedure: LEFT HEART CATH AND CORONARY ANGIOGRAPHY;  Surgeon: [name], MD;  Location: [place] [facility];  Service: Cardiovascular;  Laterality: N/A;   LEFT HEART CATH AND CORONARY ANGIOGRAPHY N/A 05/24/2020   Procedure: LEFT HEART CATH AND CORONARY ANGIOGRAPHY;  Surgeon: Burnell Blanks, MD;  Location: [place] [facility];  Service: Cardiovascular;  Laterality: N/A;   LEFT HEART CATH AND CORONARY ANGIOGRAPHY N/A 08/06/2021   Procedure: LEFT HEART CATH AND CORONARY ANGIOGRAPHY;  Surgeon: Burnell Blanks, MD;  Location: [facility];  Service: Cardiovascular;  Laterality: N/A;   LESION REMOVAL     Lip and hand    LOWER EXTREMITY ANGIOGRAM Right 11/22/2020   Procedure: RIGHT LEG ANGIOGRAM;  Surgeon: [name], MD;  [facility];  Service: Vascular;  Laterality: Right;   LOWER EXTREMITY ANGIOGRAM Right 08/15/2021   Procedure: RIGHT LOWER EXTREMITY ANGIOGRAM;  Surgeon: [name], MD;  [facility];  Service: Vascular;  Laterality: Right;   LOWER EXTREMITY ANGIOGRAPHY N/A 04/18/2019   Procedure: LOWER EXTREMITY ANGIOGRAPHY;  Surgeon: [name], MD;  Location: [facility];  Service: Cardiovascular;  Laterality: N/A;   LOWER EXTREMITY ANGIOGRAPHY N/A 08/18/2021   Procedure: Lower Extremity Angiography;  Surgeon: Waynetta Sandy, MD;  Location: [place] [facility];  Service: Cardiovascular;  Laterality: N/A;   NECK SURGERY     PERIPHERAL VASCULAR BALLOON ANGIOPLASTY  04/18/2019   Procedure: PERIPHERAL VASCULAR BALLOON ANGIOPLASTY;  Surgeon: [name], MD;  Location: [place] [facility];  Service: Cardiovascular;;   PERIPHERAL VASCULAR BALLOON ANGIOPLASTY Left 11/12/2020   Procedure: PERIPHERAL VASCULAR BALLOON ANGIOPLASTY;  Surgeon: [name], MD;  Location: [place] [facility];  Service: Cardiovascular;  Laterality: Left;  Failed PTA of superficial femoral artery.   PERIPHERAL VASCULAR BALLOON ANGIOPLASTY Right 08/18/2021   Procedure: PERIPHERAL VASCULAR BALLOON ANGIOPLASTY;  Surgeon: Waynetta Sandy, MD;  Location: [facility];  Service: Cardiovascular;  Laterality: Right;  peroneal   PERIPHERAL VASCULAR INTERVENTION Right 03/21/2019   Procedure: PERIPHERAL VASCULAR INTERVENTION;  Surgeon: [name], MD;  Location: [facility];  Service: Cardiovascular;  Laterality: Right;  superficial femoral   PERIPHERAL VASCULAR INTERVENTION Right 08/18/2021   Procedure: PERIPHERAL VASCULAR INTERVENTION;  Surgeon: Waynetta Sandy, MD;  Location: [facility];  Service: Cardiovascular;  Laterality: Right;  tibial peroneal trunk   TEE WITHOUT CARDIOVERSION N/A 12/23/2021   Procedure: TRANSESOPHAGEAL ECHOCARDIOGRAM (TEE);  Surgeon: Elouise Munroe, MD;  Location: [place];  Service: Cardiology;  Laterality: N/A;     Family History  Problem Relation Age of Onset   Diabetes Father    Valvular heart disease Father    Arthritis Father    Heart disease Father    Stroke Father    Alzheimer's disease Mother    Hyperlipidemia Mother    Hypertension Mother    Arthritis Mother    Lung cancer Mother     Stroke Mother    Headache Mother    Arthritis/Rheumatoid Sister    Diabetes Sister    Hypertension Sister    Hyperlipidemia Sister    Depression Sister    Dementia Maternal Aunt    Dementia Maternal Uncle    Heart disease Maternal Uncle    Stomach cancer Paternal Uncle    Colon cancer Neg Hx    Liver disease Neg Hx      Social History   Socioeconomic History   Marital status: Married    Spouse name: Belenda Cruise   Number of children: 2   Years of education: 12   Highest education level: 12th grade  Occupational History   Occupation: retired  Tobacco Use   Smoking status: Former    Packs/day: 0.25    Years: 51.00    Total pack years: 12.75    Types: Cigarettes    Quit date: 08/14/2016    Years since quitting: 5.8   Smokeless tobacco: Never   Tobacco comments:    smokes  a pack a week  Vaping Use   Vaping Use: Never used  Substance and Sexual Activity   Alcohol use: No    Alcohol/week: 0.0 standard drinks of alcohol   Drug use: No   Sexual activity: Yes  Other Topics Concern   Not on file  Social History Narrative   Lives with his wife.  Retired.  Unable to afford expensive medicines.     He has 2 children from previous marriage - they live in [place]       Caffeine: 1 cup of 1/2 caff coffee in AM, drinks more if at [employer]    Like fishing and working outside   [employer] Resource Strain: Medium Risk (01/06/2021)   Overall Financial Resource  Strain (CARDIA)    Difficulty of Paying Living Expenses: Somewhat hard  Food Insecurity: No Food Insecurity (02/16/2022)   Hunger Vital Sign    Worried About Running Out of Food in the Last Year: Never true    Ran Out of Food in the Last Year: Never true  Transportation Needs: No Transportation Needs (02/16/2022)   PRAPARE - Hydrologist (Medical): No    Lack of Transportation (Non-Medical): No  Physical Activity: Insufficiently Active (01/06/2021)    Exercise Vital Sign    Days of Exercise per Week: 7 days    Minutes of Exercise per Session: 10 min  Stress: No Stress Concern Present (02/16/2022)   [place]    Feeling of Stress : Only a little  Social Connections: Moderately Isolated (01/06/2021)   Social Connection and Isolation Panel [NHANES]    Frequency of Communication with Friends and Family: More than three times a week    Frequency of Social Gatherings with Friends and Family: Twice a week    Attends Religious Services: Never    Marine scientist or Organizations: No    Attends Archivist Meetings: Never    Marital Status: Married  Human resources officer Violence: Not At Risk (02/16/2022)   Humiliation, Afraid, Rape, and Kick questionnaire    Fear of Current or Ex-Partner: No    Emotionally Abused: No    Physically Abused: No    Sexually Abused: No     BP 114/70   Pulse (!) 40   Ht '6\' 3"'$  (1.905 m)   Wt 278 lb (126.1 kg)   SpO2 91%   BMI 34.75 kg/m   Physical Exam:  Chronically ill appearing NAD HEENT: Unremarkable Neck:  No JVD, no thyromegally Lymphatics:  No adenopathy Back:  No CVA tenderness Lungs:  Clear with no wheezes HEART:  Regular rate rhythm, no murmurs, no rubs, no clicks Abd:  soft, positive bowel sounds, no organomegally, no rebound, no guarding Ext:  2 plus pulses, 1-2+ edema, no cyanosis, no clubbing Skin:  No rashes no nodules Neuro:  CN II through XII intact, motor grossly intact     Assess/Plan:   PAF - He has had occaisional episodes of atrial fib. I suspect that he will have more atrial fib. He will undergo watchful waiting. 2. Obesity - he has lost 4 more lbs. He has started on mounjaro and hopefully this will help. 3. HTN - his bp is fairly well controlled 4. CAD - he denies anginal symptoms despite his CTO of the RCA.  5. Preop eval - the patient is low risk for upcoming surgery and may proceed with an  acceptable risk.    Carleene Overlie Maan Zarcone,MD

## 2022-06-22 ENCOUNTER — Other Ambulatory Visit: Payer: Self-pay | Admitting: Family

## 2022-06-23 ENCOUNTER — Encounter: Payer: Self-pay | Admitting: Family

## 2022-06-23 ENCOUNTER — Telehealth: Payer: Self-pay | Admitting: Family

## 2022-06-23 ENCOUNTER — Telehealth (INDEPENDENT_AMBULATORY_CARE_PROVIDER_SITE_OTHER): Payer: Medicare Other | Admitting: Family

## 2022-06-23 ENCOUNTER — Ambulatory Visit (HOSPITAL_COMMUNITY)
Admission: RE | Admit: 2022-06-23 | Discharge: 2022-06-23 | Disposition: A | Discharge status: Home / Self Care | Payer: Medicare Other | Source: Ambulatory Visit | Attending: Family | Admitting: Family

## 2022-06-23 DIAGNOSIS — R590 Localized enlarged lymph nodes: Secondary | ICD-10-CM | POA: Diagnosis not present

## 2022-06-23 DIAGNOSIS — R109 Unspecified abdominal pain: Secondary | ICD-10-CM

## 2022-06-23 DIAGNOSIS — R300 Dysuria: Secondary | ICD-10-CM | POA: Diagnosis not present

## 2022-06-23 DIAGNOSIS — R399 Unspecified symptoms and signs involving the genitourinary system: Secondary | ICD-10-CM | POA: Diagnosis not present

## 2022-06-23 DIAGNOSIS — R161 Splenomegaly, not elsewhere classified: Secondary | ICD-10-CM | POA: Diagnosis not present

## 2022-06-23 DIAGNOSIS — K4090 Unilateral inguinal hernia, without obstruction or gangrene, not specified as recurrent: Secondary | ICD-10-CM | POA: Diagnosis not present

## 2022-06-23 DIAGNOSIS — K8020 Calculus of gallbladder without cholecystitis without obstruction: Secondary | ICD-10-CM | POA: Diagnosis not present

## 2022-06-23 LAB — URINALYSIS, COMPLETE
Bilirubin, UA: NEGATIVE
Ketones, UA: NEGATIVE
Leukocytes,UA: NEGATIVE
Nitrite, UA: NEGATIVE
RBC, UA: NEGATIVE
Specific Gravity, UA: 1.02 (ref 1.005–1.030)
Urobilinogen, Ur: 4 mg/dL — ABNORMAL HIGH (ref 0.2–1.0)
pH, UA: 7 (ref 5.0–7.5)

## 2022-06-23 LAB — MICROSCOPIC EXAMINATION
Bacteria, UA: NONE SEEN
Epithelial Cells (non renal): NONE SEEN /hpf (ref 0–10)
Renal Epithel, UA: NONE SEEN /hpf
WBC, UA: NONE SEEN /hpf (ref 0–5)

## 2022-06-23 MED ORDER — CIPROFLOXACIN HCL 500 MG PO TABS: 500.0000 mg | ORAL_TABLET | Freq: Two times a day (BID) | ORAL | 0 refills | Status: DC

## 2022-06-23 NOTE — Progress Notes (Addendum)
Virtual Visit Consent   [name] [name], you are scheduled for a virtual visit with a [facility] provider today. Just as with appointments in the office, your consent must be obtained to participate. Your consent will be active for this visit and any virtual visit you may have with one of our providers in the next 365 days. If you have a MyChart account, a copy of this consent can be sent to you electronically.  As this is a virtual visit, video technology does not allow for your provider to perform a traditional examination. This may limit your provider's ability to fully assess your condition. If your provider identifies any concerns that need to be evaluated in person or the need to arrange testing (such as labs, EKG, etc.), we will make arrangements to do so. Although advances in technology are sophisticated, we cannot ensure that it will always work on either your end or our end. If the connection with a video visit is poor, the visit may have to be switched to a telephone visit. With either a video or telephone visit, we are not always able to ensure that we have a secure connection.  By engaging in this virtual visit, you consent to the provision of healthcare and authorize for your insurance to be billed (if applicable) for the services provided during this visit. Depending on your insurance coverage, you may receive a charge related to this service.  I need to obtain your verbal consent now. Are you willing to proceed with your visit today? Antoino A Goyer has provided verbal consent on 06/23/2022 for a virtual visit (video or telephone). [name], FNP  Date: 06/23/2022 12:38 PM  Virtual Visit via Video Note   I, [name], connected with  DELAWRENCE [name]  ([id number], [birth year]) on 06/23/22 at 10:55 AM EST by a video-enabled telemedicine application and verified that I am speaking with the correct person using two identifiers.  Location: Patient: Virtual Visit Location Patient:  Home Provider: Virtual Visit Location Provider: Office/Clinic   I discussed the limitations of evaluation and management by telemedicine and the availability of in person appointments. The patient expressed understanding and agreed to proceed.    History of Present Illness: [name] [name] is a 72 y.o. who identifies as a male who was assigned male at birth, and is being seen today for dysuria and right flank that started this week.   HPI: Dysuria  This is a new problem. The current episode started in the past 7 days. The problem occurs intermittently. The quality of the pain is described as burning. The pain is mild. There has been no fever. Associated symptoms include flank pain, frequency, hesitancy and urgency. Pertinent negatives include no hematuria, nausea or vomiting. He has tried increased fluids for the symptoms. The treatment provided mild relief.     Problems:  Patient Active Problem List   Diagnosis Date Noted   Dysphagia 03/17/2022   Cholecystitis, acute    Elevated lactic acid level    Generalized weakness    Elevated liver function tests 01/22/2022   Nausea and vomiting 01/22/2022   Choledocholithiasis 01/22/2022   Lactic acidosis 01/22/2022   Recurrent bacteremia 01/21/2022   Dry gangrene ([place]) 01/21/2022   Colonization status 01/21/2022   Pressure injury of skin 12/18/2021   SIRS (systemic inflammatory response syndrome) ([place]) 12/17/2021   Iron deficiency anemia 10/16/2021   Critical lower limb ischemia ([place]) 08/16/2021   Atherosclerosis of arteries of extremities ([place]) 08/15/2021   Coronary artery  disease of native artery of native heart with stable angina pectoris ([place])    Leg wound, left 07/25/2021   S/P femoral-popliteal bypass surgery 11/22/2020   PAOD (peripheral arterial occlusive disease) ([place]) 11/22/2020   Non-ST elevation (NSTEMI) myocardial infarction ([place]) 05/23/2020   Orthostatic hypotension 01/01/2020   Statin myopathy 12/06/2019   Diarrhea  05/10/2018   PAF (paroxysmal atrial fibrillation) ([place]) 03/29/2018   Lacunar infarction ([place]) 03/29/2018   Unstable angina ([facility])    Chest pain 01/18/2018   CHF (congestive heart failure) ([place]) 10/21/2017   Anxiety 03/09/2017   Constipation 11/26/2016   History of non-ST elevation myocardial infarction (NSTEMI) 07/29/2016   Diabetes ([place]) 05/25/2016   Occlusion and stenosis of vertebral artery    AKI (acute kidney injury) ([place]) 05/23/2016   Acute kidney failure, unspecified ([place]) 05/23/2016   Diabetic neuropathy ([place]) 01/13/2016   Depression 11/01/2015   Erectile dysfunction 01/23/2015   Class II obesity 01/23/2015   Periodontal disease 01/23/2015   Stopped smoking with greater than 30 pack year history 01/23/2015   Diabetic peripheral neuropathy associated with type 2 diabetes mellitus ([place]) 11/08/2014   Mucosal abnormality of stomach    History of adenomatous polyp of colon    Gastroesophageal reflux disease 09/06/2014   Low serum testosterone level 08/01/2014   DDD (degenerative disc disease), cervical 12/20/2013   Headache 08/07/2013   Medically noncompliant 10/25/2011   CAD S/P percutaneous coronary angioplasty 10/25/2011   MURMUR 04/16/2009   Hypertension associated with diabetes ([place]) 08/02/2008   Hyperlipidemia associated with type 2 diabetes mellitus ([place]) 07/31/2008   DISORDERS OF IRON METABOLISM 07/31/2008    Allergies:  Allergies  Allergen Reactions   Shellfish Allergy Anaphylaxis, Hives and Swelling    Tongue swelling    Sulfa Antibiotics Anaphylaxis, Hives and Swelling    Tongue swelling   Ace Inhibitors Other (See Comments) and Cough    CKD, renal failure    Invokana [Canagliflozin] Other (See Comments)    Syncope Dehydration    Lexapro [Escitalopram] Other (See Comments)    Buzzing in ears Headaches "Felt like a zombie"   Metformin And Related Itching   Pravachol [Pravastatin Sodium] Other (See Comments)    Myalgias    Repatha [Evolocumab] Other  (See Comments)    Myalgias Flu like symptoms    Tricor [Fenofibrate] Other (See Comments)    Myalgias     Zestril [Lisinopril] Cough   Farxiga [Dapagliflozin] Other (See Comments)    Nephro d/c'd   Crestor [Rosuvastatin] Other (See Comments)    Myalgias    Horse-Derived Products Rash    horse serum   Lipitor [Atorvastatin] Other (See Comments)    Myalgias    Livalo [Pitavastatin] Other (See Comments)    Myalgias    Milk ([place]) Diarrhea and Nausea Only    Stomach upset    Tape Rash   Medications:  Current Outpatient Medications:    ciprofloxacin (CIPRO) 500 MG tablet, Take 1 tablet (500 mg total) by mouth 2 (two) times daily., Disp: 14 tablet, Rfl: 0   albuterol (VENTOLIN HFA) 108 (90 Base) MCG/ACT inhaler, Inhale 2 puffs into the lungs every 6 (six) hours as needed for wheezing or shortness of breath., Disp: 8.5 g, Rfl: 0   apixaban (ELIQUIS) 5 MG TABS tablet, TAKE ONE (1) TABLET BY MOUTH TWICE DAILY, Disp: 60 tablet, Rfl: 2   Calcium Carbonate Antacid (ALKA-SELTZER ANTACID PO), Take 1 tablet by mouth 2 (two) times daily as needed (heartburn, indigestion)., Disp: , Rfl:  clopidogrel (PLAVIX) 75 MG tablet, TAKE 1 TABLET BY MOUTH DAILY, Disp: 30 tablet, Rfl: 10   DULoxetine (CYMBALTA) 30 MG capsule, Take 3 capsules (90 mg total) by mouth daily., Disp: 120 capsule, Rfl: 1   ezetimibe (ZETIA) 10 MG tablet, TAKE 1 TABLET BY MOUTH DAILY, Disp: 90 tablet, Rfl: 3   fenofibrate 160 MG tablet, TAKE 1 TABLET BY MOUTH DAILY, Disp: 30 tablet, Rfl: 2   FEROSUL 325 (65 Fe) MG tablet, Take 325 mg by mouth every morning., Disp: , Rfl:    furosemide (LASIX) 20 MG tablet, TAKE ONE (1) TABLET BY MOUTH TWICE DAILY FOR 7 DAYS THEN TAKE 1 TABLET BY MOUTH DAILY, Disp: 30 tablet, Rfl: 2   gabapentin (NEURONTIN) 300 MG capsule, Take 3 capsules (900 mg total) by mouth 2 (two) times daily., Disp: 120 capsule, Rfl: 6   HYDROcodone-acetaminophen (NORCO) 10-325 MG tablet, Take 1 tablet by mouth every 4  (four) hours as needed for moderate pain., Disp: 30 tablet, Rfl: 0   insulin degludec (TRESIBA FLEXTOUCH) 200 UNIT/ML FlexTouch Pen, Inject 20 Units into the skin at bedtime., Disp: 9 mL, Rfl: 2   insulin lispro (HUMALOG) 100 UNIT/ML KwikPen, INJECT 15-20 UNITS SUBCUTANEOUSLY THREE TIMES A DAY WITH MEALS (Patient taking differently: Inject 15 Units into the skin 3 (three) times daily before meals.), Disp: 15 mL, Rfl: 10   insulin NPH-regular Human (HUMULIN 70/30) (70-30) 100 UNIT/ML injection, INJECT 60 UNITS SUBCUTANEOUSLY TWICE DAILY WITH MEAL, Disp: 30 mL, Rfl: 2   isosorbide mononitrate (IMDUR) 30 MG 24 hr tablet, TAKE 1 TABLET BY MOUTH DAILY, Disp: 30 tablet, Rfl: 10   Menthol, Topical Analgesic, (BLUE-EMU MAXIMUM STRENGTH EX), Apply 1 application  topically daily as needed (pain)., Disp: , Rfl:    metolazone (ZAROXOLYN) 5 MG tablet, Take 5 mg by mouth daily., Disp: , Rfl:    metoprolol succinate (TOPROL-XL) 50 MG 24 hr tablet, Take 1 tablet (50 mg total) by mouth daily., Disp: 30 tablet, Rfl: 3   metoprolol tartrate (LOPRESSOR) 50 MG tablet, Take 50 mg by mouth daily as needed (atrial fibrillation)., Disp: , Rfl:    nitroGLYCERIN (NITROSTAT) 0.4 MG SL tablet, DISSOLVE ONE TABLET UNDER THE TONGUE EVERY 5 MINUTES AS NEEDED FOR CHEST PAIN.  DO NOT EXCEED A TOTAL OF 3 DOSES IN 15 MINUTES Strength: 0.4 mg, Disp: 25 tablet, Rfl: 0   pantoprazole (PROTONIX) 40 MG tablet, Take 1 tablet (40 mg total) by mouth 2 (two) times daily. (Patient taking differently: Take 40 mg by mouth daily.), Disp: 60 tablet, Rfl: 0   tirzepatide (MOUNJARO) 10 MG/0.5ML Pen, Inject 10 mg into the skin once a week., Disp: 6 mL, Rfl: 2   TRUEPLUS INSULIN SYRINGE 28G X 1/2" 1 ML MISC, USE TO GIVE INSULIN FIVE TIMES DAILY, Disp: 100 each, Rfl: 10  Observations/Objective: Patient is well-developed, well-nourished in no acute distress.  Resting comfortably  at home.  Head is normocephalic, atraumatic.  No labored breathing.   Speech is clear and coherent with logical content.  Patient is alert and oriented at baseline.  No acute distress, right CVA tenderness.  Assessment and Plan: 1. Dysuria - Urinalysis, Complete - Urine Culture - ciprofloxacin (CIPRO) 500 MG tablet; Take 1 tablet (500 mg total) by mouth 2 (two) times daily.  Dispense: 14 tablet; Refill: 0  2. UTI symptoms - ciprofloxacin (CIPRO) 500 MG tablet; Take 1 tablet (500 mg total) by mouth 2 (two) times daily.  Dispense: 14 tablet; Refill: 0  3. Flank pain -  ciprofloxacin (CIPRO) 500 MG tablet; Take 1 tablet (500 mg total) by mouth 2 (two) times daily.  Dispense: 14 tablet; Refill: 0  Wife will bring urine sample to office today. If pain continues may need scan.  Start cipro once we have urine to confirm infection vs stone.  Continue to force fluids Red flags discussed to go to ED!   Urine was negative, given pain will do CT.    Follow Up Instructions: I discussed the assessment and treatment plan with the patient. The patient was provided an opportunity to ask questions and all were answered. The patient agreed with the plan and demonstrated an understanding of the instructions.  A copy of instructions were sent to the patient via MyChart unless otherwise noted below.     The patient was advised to call back or seek an in-person evaluation if the symptoms worsen or if the condition fails to improve as anticipated.  Time:  I spent 25 minutes with the patient via telehealth technology discussing the above problems/concerns.    [name], FNP

## 2022-06-23 NOTE — Telephone Encounter (Signed)
Appt made

## 2022-06-23 NOTE — Telephone Encounter (Signed)
Pt called requesting to be seen today. Explained to pt that we dont have any openings left for today but we could get him first thing tomorrow. Pt declined. Wanted me to send message to PCP letting her know that he needs antibiotic for his kidney. Says for the past few days his right kidney has been sore.   Please advise and call patient.

## 2022-06-23 NOTE — Addendum Note (Signed)
Addended by: [name] A on: 06/23/2022 03:09 PM   Modules accepted: Orders, Level of Service

## 2022-06-23 NOTE — Patient Instructions (Signed)
Flank Pain, Adult ?Flank pain is pain that is located on the side of the body between the upper abdomen and the spine. This area is called the flank. The pain may occur over a short period of time (acute), or it may be long-term or recurring (chronic). It may be mild or severe. Flank pain can be caused by many things, including: ?Muscle soreness or injury. ?Kidney infection, kidney stones, or kidney disease. ?Stress. ?A disease of the spine (vertebral disk disease). ?A lung infection (pneumonia). ?Fluid around the lungs (pulmonary edema). ?A skin rash caused by the chickenpox virus (shingles). ?Tumors that affect the back of the abdomen. ?Gallbladder disease. ?Follow these instructions at home: ? ?Drink enough fluid to keep your urine pale yellow. ?Rest as told by your health care provider. ?Take over-the-counter and prescription medicines only as told by your health care provider. ?Keep a journal to track what has caused your flank pain and what has made it feel better. ?Keep all follow-up visits. This is important. ?Contact a health care provider if: ?Your pain is not controlled with medicine. ?You have new symptoms. ?Your pain gets worse. ?Your symptoms last longer than 2-3 days. ?You have trouble urinating or you are urinating very frequently. ?Get help right away if: ?You have trouble breathing or you are short of breath. ?Your abdomen hurts or it is swollen or red. ?You have nausea or vomiting. ?You feel faint, or you faint. ?You have blood in your urine. ?You have flank pain and a fever. ?These symptoms may represent a serious problem that is an emergency. Do not wait to see if the symptoms will go away. Get medical help right away. Call your local emergency services (911 in the [place] Do not drive yourself to the hospital. ?Summary ?Flank pain is pain that is located on the side of the body between the upper abdomen and the spine. ?The pain may occur over a short period of time (acute), or it may be  long-term or recurring (chronic). It may be mild or severe. ?Flank pain can be caused by many things. ?Contact your health care provider if your symptoms get worse or last longer than 2-3 days. ?This information is not intended to replace advice given to you by your health care provider. Make sure you discuss any questions you have with your health care provider. ?Document Revised: 07/15/2020 Document Reviewed: 07/15/2020 ?Elsevier Patient Education ? 2023 [employer] ? ?

## 2022-06-25 ENCOUNTER — Other Ambulatory Visit: Payer: Self-pay | Admitting: Family

## 2022-06-25 DIAGNOSIS — I70244 Atherosclerosis of native arteries of left leg with ulceration of heel and midfoot: Secondary | ICD-10-CM | POA: Diagnosis not present

## 2022-06-25 DIAGNOSIS — L97522 Non-pressure chronic ulcer of other part of left foot with fat layer exposed: Secondary | ICD-10-CM | POA: Diagnosis not present

## 2022-06-25 DIAGNOSIS — L97821 Non-pressure chronic ulcer of other part of left lower leg limited to breakdown of skin: Secondary | ICD-10-CM | POA: Diagnosis not present

## 2022-06-25 DIAGNOSIS — I2510 Atherosclerotic heart disease of native coronary artery without angina pectoris: Secondary | ICD-10-CM | POA: Diagnosis not present

## 2022-06-25 DIAGNOSIS — I739 Peripheral vascular disease, unspecified: Secondary | ICD-10-CM | POA: Diagnosis not present

## 2022-06-25 DIAGNOSIS — E1140 Type 2 diabetes mellitus with diabetic neuropathy, unspecified: Secondary | ICD-10-CM | POA: Diagnosis not present

## 2022-06-25 DIAGNOSIS — I87312 Chronic venous hypertension (idiopathic) with ulcer of left lower extremity: Secondary | ICD-10-CM | POA: Diagnosis not present

## 2022-06-25 DIAGNOSIS — I480 Paroxysmal atrial fibrillation: Secondary | ICD-10-CM | POA: Diagnosis not present

## 2022-06-25 DIAGNOSIS — I50813 Acute on chronic right heart failure: Secondary | ICD-10-CM | POA: Diagnosis not present

## 2022-06-25 DIAGNOSIS — S91301D Unspecified open wound, right foot, subsequent encounter: Secondary | ICD-10-CM | POA: Diagnosis not present

## 2022-06-25 DIAGNOSIS — R591 Generalized enlarged lymph nodes: Secondary | ICD-10-CM

## 2022-06-25 LAB — URINE CULTURE

## 2022-06-29 DIAGNOSIS — S91301D Unspecified open wound, right foot, subsequent encounter: Secondary | ICD-10-CM | POA: Diagnosis not present

## 2022-06-29 DIAGNOSIS — L97821 Non-pressure chronic ulcer of other part of left lower leg limited to breakdown of skin: Secondary | ICD-10-CM | POA: Diagnosis not present

## 2022-06-30 DIAGNOSIS — I87311 Chronic venous hypertension (idiopathic) with ulcer of right lower extremity: Secondary | ICD-10-CM | POA: Diagnosis not present

## 2022-06-30 DIAGNOSIS — L97521 Non-pressure chronic ulcer of other part of left foot limited to breakdown of skin: Secondary | ICD-10-CM | POA: Diagnosis not present

## 2022-06-30 DIAGNOSIS — L97821 Non-pressure chronic ulcer of other part of left lower leg limited to breakdown of skin: Secondary | ICD-10-CM | POA: Diagnosis not present

## 2022-06-30 DIAGNOSIS — I70244 Atherosclerosis of native arteries of left leg with ulceration of heel and midfoot: Secondary | ICD-10-CM | POA: Diagnosis not present

## 2022-06-30 DIAGNOSIS — E1140 Type 2 diabetes mellitus with diabetic neuropathy, unspecified: Secondary | ICD-10-CM | POA: Diagnosis not present

## 2022-06-30 DIAGNOSIS — I87312 Chronic venous hypertension (idiopathic) with ulcer of left lower extremity: Secondary | ICD-10-CM | POA: Diagnosis not present

## 2022-06-30 DIAGNOSIS — I739 Peripheral vascular disease, unspecified: Secondary | ICD-10-CM | POA: Diagnosis not present

## 2022-06-30 DIAGNOSIS — L97811 Non-pressure chronic ulcer of other part of right lower leg limited to breakdown of skin: Secondary | ICD-10-CM | POA: Diagnosis not present

## 2022-06-30 DIAGNOSIS — I50813 Acute on chronic right heart failure: Secondary | ICD-10-CM | POA: Diagnosis not present

## 2022-06-30 DIAGNOSIS — I2510 Atherosclerotic heart disease of native coronary artery without angina pectoris: Secondary | ICD-10-CM | POA: Diagnosis not present

## 2022-07-01 ENCOUNTER — Other Ambulatory Visit: Payer: Self-pay | Admitting: Student

## 2022-07-01 ENCOUNTER — Ambulatory Visit: Payer: Self-pay | Admitting: *Deleted

## 2022-07-01 ENCOUNTER — Other Ambulatory Visit: Payer: Self-pay | Admitting: Family

## 2022-07-01 DIAGNOSIS — E1169 Type 2 diabetes mellitus with other specified complication: Secondary | ICD-10-CM

## 2022-07-01 NOTE — Patient Outreach (Signed)
  Care Coordination   07/01/2022 Name: [name] [name] MRN: [id number] DOB: [birth year]   Care Coordination Outreach Attempts:  An unsuccessful telephone outreach was attempted today to offer the patient information about available care coordination services as a benefit of their health plan.   Follow Up Plan:  Additional outreach attempts will be made to offer the patient care coordination information and services.   Encounter Outcome:  No Answer   Care Coordination Interventions:  No, not indicated     Teairra Millar L. Lavina Hamman, RN, BSN, [place] Coordinator Office number [phone]

## 2022-07-02 ENCOUNTER — Telehealth (INDEPENDENT_AMBULATORY_CARE_PROVIDER_SITE_OTHER): Payer: Medicare Other | Admitting: Family

## 2022-07-02 ENCOUNTER — Other Ambulatory Visit: Payer: Self-pay | Admitting: Family

## 2022-07-02 ENCOUNTER — Encounter: Payer: Self-pay | Admitting: Family

## 2022-07-02 DIAGNOSIS — E1169 Type 2 diabetes mellitus with other specified complication: Secondary | ICD-10-CM | POA: Diagnosis not present

## 2022-07-02 DIAGNOSIS — R109 Unspecified abdominal pain: Secondary | ICD-10-CM | POA: Diagnosis not present

## 2022-07-02 DIAGNOSIS — R399 Unspecified symptoms and signs involving the genitourinary system: Secondary | ICD-10-CM

## 2022-07-02 DIAGNOSIS — R300 Dysuria: Secondary | ICD-10-CM

## 2022-07-02 DIAGNOSIS — Z794 Long term (current) use of insulin: Secondary | ICD-10-CM | POA: Diagnosis not present

## 2022-07-02 MED ORDER — HYDROCODONE-ACETAMINOPHEN 10-325 MG PO TABS: 1.0000 | ORAL_TABLET | Freq: Four times a day (QID) | ORAL | 0 refills | Status: DC | PRN

## 2022-07-02 MED ORDER — BACLOFEN 10 MG PO TABS: 5.0000 mg | ORAL_TABLET | Freq: Three times a day (TID) | ORAL | 0 refills | Status: DC

## 2022-07-02 MED ORDER — PREDNISONE 20 MG PO TABS: 40.0000 mg | ORAL_TABLET | Freq: Every day | ORAL | 0 refills | Status: AC

## 2022-07-02 NOTE — Progress Notes (Signed)
Virtual Visit Consent   [name] [name], you are scheduled for a virtual visit with a [facility] provider today. Just as with appointments in the office, your consent must be obtained to participate. Your consent will be active for this visit and any virtual visit you may have with one of our providers in the next 365 days. If you have a MyChart account, a copy of this consent can be sent to you electronically.  As this is a virtual visit, video technology does not allow for your provider to perform a traditional examination. This may limit your provider's ability to fully assess your condition. If your provider identifies any concerns that need to be evaluated in person or the need to arrange testing (such as labs, EKG, etc.), we will make arrangements to do so. Although advances in technology are sophisticated, we cannot ensure that it will always work on either your end or our end. If the connection with a video visit is poor, the visit may have to be switched to a telephone visit. With either a video or telephone visit, we are not always able to ensure that we have a secure connection.  By engaging in this virtual visit, you consent to the provision of healthcare and authorize for your insurance to be billed (if applicable) for the services provided during this visit. Depending on your insurance coverage, you may receive a charge related to this service.  I need to obtain your verbal consent now. Are you willing to proceed with your visit today? [name] [name] has provided verbal consent on 07/02/2022 for a virtual visit (video or telephone). [name], FNP  Date: 07/02/2022 12:40 PM  Virtual Visit via Video Note   I, [name], connected with  [name] [name]  ([id number], 05/09/1951) on 07/02/22 at 11:40 AM EST by a video-enabled telemedicine application and verified that I am speaking with the correct person using two identifiers.  Location: Patient: Virtual Visit Location Patient:  Home Provider: Virtual Visit Location Provider: Office/Clinic   I discussed the limitations of evaluation and management by telemedicine and the availability of in person appointments. The patient expressed understanding and agreed to proceed.    History of Present Illness: [name] [name] is a 72 y.o. who identifies as a male who was assigned male at birth, and is being seen today for right flank pain that started last week. He had a video visit and was given cipro. Reports the pain improved, but since completing his flank pain is a 8-9 out 10. He had a negative urine culture and CT renal stone that showed, "1. No suspicious CT etiology for abdominal pain identified. Moderate colonic stool burden throughout the colon. 2. Morphologic changes of the liver which could reflect underlying cirrhosis. Mild splenomegaly. 3. Mild increase in size of several mildly enlarged retroperitoneal and pelvic lymph nodes since 2020. These are nonspecific and could be reactive in etiology. Recommend correlation with laboratory values including PSA values."   HPI: Flank Pain This is a new problem. The current episode started 1 to 4 weeks ago. Pain location: right. The pain is at a severity of 9/10. The pain is moderate. Risk factors include obesity. He has tried bed rest for the symptoms. The treatment provided mild relief.  Diabetes He presents for his follow-up diabetic visit. He has type 2 diabetes mellitus. Associated symptoms include blurred vision and foot paresthesias. Symptoms are stable. Diabetic complications include heart disease, nephropathy and peripheral neuropathy. Risk factors for coronary artery  disease include dyslipidemia, diabetes mellitus, hypertension, male sex and sedentary lifestyle. He is following a generally healthy diet. His overall blood glucose range is 140-180 mg/dl.    Problems:  Patient Active Problem List   Diagnosis Date Noted   Dysphagia 03/17/2022   Cholecystitis, acute     Elevated lactic acid level    Generalized weakness    Elevated liver function tests 01/22/2022   Nausea and vomiting 01/22/2022   Choledocholithiasis 01/22/2022   Lactic acidosis 01/22/2022   Recurrent bacteremia 01/21/2022   Dry gangrene ([place]) 01/21/2022   Colonization status 01/21/2022   Pressure injury of skin 12/18/2021   SIRS (systemic inflammatory response syndrome) ([place]) 12/17/2021   Iron deficiency anemia 10/16/2021   Critical lower limb ischemia ([place]) 08/16/2021   Atherosclerosis of arteries of extremities ([place]) 08/15/2021   Coronary artery disease of native artery of native heart with stable angina pectoris ([place])    Leg wound, left 07/25/2021   S/P femoral-popliteal bypass surgery 11/22/2020   PAOD (peripheral arterial occlusive disease) ([place]) 11/22/2020   Non-ST elevation (NSTEMI) myocardial infarction ([place]) 05/23/2020   Orthostatic hypotension 01/01/2020   Statin myopathy 12/06/2019   Diarrhea 05/10/2018   PAF (paroxysmal atrial fibrillation) ([place]) 03/29/2018   Lacunar infarction ([place]) 03/29/2018   Unstable angina (HCC)    Chest pain 01/18/2018   CHF (congestive heart failure) ([place]) 10/21/2017   Anxiety 03/09/2017   Constipation 11/26/2016   History of non-ST elevation myocardial infarction (NSTEMI) 07/29/2016   Diabetes ([place]) 05/25/2016   Occlusion and stenosis of vertebral artery    AKI (acute kidney injury) ([place]) 05/23/2016   Acute kidney failure, unspecified ([place]) 05/23/2016   Diabetic neuropathy ([place]) 01/13/2016   Depression 11/01/2015   Erectile dysfunction 01/23/2015   Class II obesity 01/23/2015   Periodontal disease 01/23/2015   Stopped smoking with greater than 30 pack year history 01/23/2015   Diabetic peripheral neuropathy associated with type 2 diabetes mellitus ([place]) 11/08/2014   Mucosal abnormality of stomach    History of adenomatous polyp of colon    Gastroesophageal reflux disease 09/06/2014   Low serum testosterone level 08/01/2014   DDD  (degenerative disc disease), cervical 12/20/2013   Headache 08/07/2013   Medically noncompliant 10/25/2011   CAD S/P percutaneous coronary angioplasty 10/25/2011   MURMUR 04/16/2009   Hypertension associated with diabetes ([place]) 08/02/2008   Hyperlipidemia associated with type 2 diabetes mellitus ([place]) 07/31/2008   DISORDERS OF IRON METABOLISM 07/31/2008    Allergies:  Allergies  Allergen Reactions   Shellfish Allergy Anaphylaxis, Hives and Swelling    Tongue swelling    Sulfa Antibiotics Anaphylaxis, Hives and Swelling    Tongue swelling   Ace Inhibitors Other (See Comments) and Cough    CKD, renal failure    Invokana [Canagliflozin] Other (See Comments)    Syncope Dehydration    Lexapro [Escitalopram] Other (See Comments)    Buzzing in ears Headaches "Felt like a zombie"   Metformin And Related Itching   Pravachol [Pravastatin Sodium] Other (See Comments)    Myalgias    Repatha [Evolocumab] Other (See Comments)    Myalgias Flu like symptoms    Tricor [Fenofibrate] Other (See Comments)    Myalgias     Zestril [Lisinopril] Cough   Farxiga [Dapagliflozin] Other (See Comments)    Nephro d/c'd   Crestor [Rosuvastatin] Other (See Comments)    Myalgias    Horse-Derived Products Rash    horse serum   Lipitor [Atorvastatin] Other (See Comments)    Myalgias  Livalo [Pitavastatin] Other (See Comments)    Myalgias    Milk ([place]) Diarrhea and Nausea Only    Stomach upset    Tape Rash   Medications:  Current Outpatient Medications:    baclofen (LIORESAL) 10 MG tablet, Take 0.5-1 tablets (5-10 mg total) by mouth 3 (three) times daily., Disp: 30 each, Rfl: 0   predniSONE (DELTASONE) 20 MG tablet, Take 2 tablets (40 mg total) by mouth daily with breakfast for 5 days., Disp: 10 tablet, Rfl: 0   albuterol (VENTOLIN HFA) 108 (90 Base) MCG/ACT inhaler, Inhale 2 puffs into the lungs every 6 (six) hours as needed for wheezing or shortness of breath., Disp: 8.5 g, Rfl: 0    apixaban (ELIQUIS) 5 MG TABS tablet, TAKE ONE (1) TABLET BY MOUTH TWICE DAILY, Disp: 60 tablet, Rfl: 2   Calcium Carbonate Antacid (ALKA-SELTZER ANTACID PO), Take 1 tablet by mouth 2 (two) times daily as needed (heartburn, indigestion)., Disp: , Rfl:    clopidogrel (PLAVIX) 75 MG tablet, TAKE 1 TABLET BY MOUTH DAILY, Disp: 30 tablet, Rfl: 10   DULoxetine (CYMBALTA) 30 MG capsule, Take 3 capsules (90 mg total) by mouth daily., Disp: 120 capsule, Rfl: 1   ezetimibe (ZETIA) 10 MG tablet, TAKE 1 TABLET BY MOUTH DAILY, Disp: 90 tablet, Rfl: 3   fenofibrate 160 MG tablet, TAKE 1 TABLET BY MOUTH DAILY, Disp: 30 tablet, Rfl: 2   FEROSUL 325 (65 Fe) MG tablet, Take 325 mg by mouth every morning., Disp: , Rfl:    furosemide (LASIX) 20 MG tablet, TAKE 1 TABLET BY MOUTH TWICE DAILY FOR 7 DAYS THEN TAKE 1 TABLET BY MOUTH EVERY DAY, Disp: 30 tablet, Rfl: 3   gabapentin (NEURONTIN) 300 MG capsule, Take 3 capsules (900 mg total) by mouth 2 (two) times daily., Disp: 120 capsule, Rfl: 6   HYDROcodone-acetaminophen (NORCO) 10-325 MG tablet, Take 1 tablet by mouth every 6 (six) hours as needed for moderate pain., Disp: 60 tablet, Rfl: 0   insulin degludec (TRESIBA FLEXTOUCH) 200 UNIT/ML FlexTouch Pen, Inject 20 Units into the skin at bedtime., Disp: 9 mL, Rfl: 2   insulin lispro (HUMALOG) 100 UNIT/ML KwikPen, INJECT 15-20 UNITS SUBCUTANEOUSLY THREE TIMES A DAY WITH MEALS (Patient taking differently: Inject 15 Units into the skin 3 (three) times daily before meals.), Disp: 15 mL, Rfl: 10   insulin NPH-regular Human (HUMULIN 70/30) (70-30) 100 UNIT/ML injection, INJECT 60 UNITS SUBCUTANEOUSLY TWICE DAILY WITH MEAL, Disp: 30 mL, Rfl: 2   isosorbide mononitrate (IMDUR) 30 MG 24 hr tablet, TAKE 1 TABLET BY MOUTH DAILY, Disp: 30 tablet, Rfl: 10   Menthol, Topical Analgesic, (BLUE-EMU MAXIMUM STRENGTH EX), Apply 1 application  topically daily as needed (pain)., Disp: , Rfl:    metolazone (ZAROXOLYN) 5 MG tablet, Take 5 mg by  mouth daily., Disp: , Rfl:    metoprolol succinate (TOPROL-XL) 50 MG 24 hr tablet, Take 1 tablet (50 mg total) by mouth daily., Disp: 30 tablet, Rfl: 3   metoprolol tartrate (LOPRESSOR) 50 MG tablet, Take 50 mg by mouth daily as needed (atrial fibrillation)., Disp: , Rfl:    nitroGLYCERIN (NITROSTAT) 0.4 MG SL tablet, DISSOLVE ONE TABLET UNDER THE TONGUE EVERY 5 MINUTES AS NEEDED FOR CHEST PAIN.  DO NOT EXCEED A TOTAL OF 3 DOSES IN 15 MINUTES Strength: 0.4 mg, Disp: 25 tablet, Rfl: 0   pantoprazole (PROTONIX) 40 MG tablet, Take 1 tablet (40 mg total) by mouth 2 (two) times daily. (Patient taking differently: Take 40 mg by mouth  daily.), Disp: 60 tablet, Rfl: 0   tirzepatide (MOUNJARO) 10 MG/0.5ML Pen, Inject 10 mg into the skin once a week., Disp: 6 mL, Rfl: 2   TRUEPLUS INSULIN SYRINGE 28G X 1/2" 1 ML MISC, USE TO GIVE INSULIN FIVE TIMES DAILY, Disp: 100 each, Rfl: 10  Observations/Objective: Patient is well-developed, well-nourished in no acute distress.  Resting comfortably  at home.  Head is normocephalic, atraumatic.  No labored breathing. Speech is clear and coherent with logical content.  Patient is alert and oriented at baseline.    Assessment and Plan: 1. Right flank pain - HYDROcodone-acetaminophen (NORCO) 10-325 MG tablet; Take 1 tablet by mouth every 6 (six) hours as needed for moderate pain.  Dispense: 60 tablet; Refill: 0 - baclofen (LIORESAL) 10 MG tablet; Take 0.5-1 tablets (5-10 mg total) by mouth 3 (three) times daily.  Dispense: 30 each; Refill: 0 - predniSONE (DELTASONE) 20 MG tablet; Take 2 tablets (40 mg total) by mouth daily with breakfast for 5 days.  Dispense: 10 tablet; Refill: 0  2. Type 2 diabetes mellitus with other specified complication, with long-term current use of insulin ([place])  I do not think this is UTI given urine culture negative. I believe this is more MSK. Will given prednisone. Strict low carb diet and take medications.  Sedation precautions with  muscle relaxer.  Norco rx refilled Pt reviewed in  controlled database  Follow up if symptoms worsen or do not improve    Follow Up Instructions: I discussed the assessment and treatment plan with the patient. The patient was provided an opportunity to ask questions and all were answered. The patient agreed with the plan and demonstrated an understanding of the instructions.  A copy of instructions were sent to the patient via MyChart unless otherwise noted below.     The patient was advised to call back or seek an in-person evaluation if the symptoms worsen or if the condition fails to improve as anticipated.  Time:  I spent 14 minutes with the patient via telehealth technology discussing the above problems/concerns.    [name], FNP

## 2022-07-06 ENCOUNTER — Telehealth: Payer: Self-pay | Admitting: Family

## 2022-07-08 ENCOUNTER — Ambulatory Visit: Payer: Self-pay | Admitting: *Deleted

## 2022-07-08 NOTE — Patient Outreach (Signed)
  Care Coordination   Follow Up Visit Note   07/08/2022 Name: [name] [name] MRN: [id number] DOB: 04-03-1951  [name] [name] is a 72 y.o. year old male who sees Hankins, Theador Hawthorne, FNP for primary care. I spoke with  [name] [name] by phone today.  What matters to the patients health and wellness today?  Doing better with flank pain after video visit on 07/13/22   He reports dead skin under his toes that his wife manages Denies open areas and signs of infection States he and wife will be careful with this process  Wife is not home at the time of this call  Lost phone connection with the patient x 1   Goals Addressed               This Visit's Progress     Patient Stated     Fall [facility]) (pt-stated)   On track     Care Coordination Interventions: Provided written and verbal education re: potential causes of falls and Fall prevention strategies Advised patient of importance of notifying provider of falls Assessed for falls since last encounter No falls reported      improved uncontrolled diabetes ([facility]) (pt-stated)   On track     Care Coordination Interventions: Provided education to patient about basic DM disease process Discussed plans with patient for ongoing care management follow up and provided patient with direct contact information for care management team Interventions Today    Flowsheet Row Most Recent Value  Chronic Disease   Chronic disease during today's visit --  [flank pain improved. his wife manages his diabetic foot care]  General Interventions   General Interventions Discussed/Reviewed Doctor Visits  Doctor Visits Discussed/Reviewed Doctor Visits Discussed, PCP  PCP/Specialist Visits Compliance with follow-up visit  Safety Interventions   Safety Discussed/Reviewed Fall Risk, Safety Reviewed             SDOH assessments and interventions completed:  No     Care Coordination Interventions:  Yes, provided   Follow up plan: Follow up call scheduled for  08/06/22    Encounter Outcome:  Pt. Visit Completed   Khara Renaud L. Lavina Hamman, RN, BSN, [place] Coordinator Office number [phone]

## 2022-07-08 NOTE — Patient Instructions (Addendum)
Visit Information  Thank you for taking time to visit with me today. Please don't hesitate to contact me if I can be of assistance to you.   Following are the goals we discussed today:   Goals Addressed               This Visit's Progress     Patient Stated     Fall [facility]) (pt-stated)   On track     Care Coordination Interventions: Provided written and verbal education re: potential causes of falls and Fall prevention strategies Advised patient of importance of notifying provider of falls Assessed for falls since last encounter No falls reported      improved uncontrolled diabetes ([facility]) (pt-stated)   On track     Care Coordination Interventions: Provided education to patient about basic DM disease process Discussed plans with patient for ongoing care management follow up and provided patient with direct contact information for care management team Interventions Today    Flowsheet Row Most Recent Value  Chronic Disease   Chronic disease during today's visit --  [flank pain improved. his wife manages his diabetic foot care]  General Interventions   General Interventions Discussed/Reviewed Doctor Visits  Doctor Visits Discussed/Reviewed Doctor Visits Discussed, PCP  PCP/Specialist Visits Compliance with follow-up visit  Safety Interventions   Safety Discussed/Reviewed Fall Risk, Safety Reviewed             Our next appointment is by telephone on 08/06/22 at 1030  Please call the care guide team at [phone] if you need to cancel or reschedule your appointment.   If you are experiencing a Mental Health or [place] or need someone to talk to, please call the Suicide and Crisis Lifeline: 988 call the [place] National Suicide Prevention Lifeline: (952)635-4347 or TTY: 660 793 1295 TTY 312-622-7271) to talk to a trained counselor call 1-800-273-TALK (toll free, 24 hour hotline) call the [facility]: (252)756-0971 call 911   Patient  verbalizes understanding of instructions and care plan provided today and agrees to view in [place]. Active MyChart status and patient understanding of how to access instructions and care plan via MyChart confirmed with patient.     The patient has been provided with contact information for the care management team and has been advised to call with any health related questions or concerns.   Loyda Costin L. Lavina Hamman, RN, BSN, [place] Coordinator Office number [phone]

## 2022-07-08 NOTE — Patient Outreach (Signed)
  Care Coordination   07/08/2022 Name: [name] [name] MRN: [id number] DOB: 04-Jul-1950   Care Coordination Outreach Attempts:  A second unsuccessful outreach was attempted today to offer the patient with information about available care coordination services as a benefit of their health plan.     Follow Up Plan:  Additional outreach attempts will be made to offer the patient care coordination information and services.   Encounter Outcome:  No Answer   Care Coordination Interventions:  No, not indicated    Bradan Congrove L. Lavina Hamman, RN, BSN, [place] Coordinator Office number [phone]

## 2022-07-08 NOTE — Telephone Encounter (Signed)
TC to Santiago Glad w/ [employer] She works closely w/ rep from [employer], they are losing their nurse this week, asked Santiago Glad if [facility] if they could pick pt up for nursing for his wounds. Santiago Glad looked over notes on pt, what she was seeing was that his wounds maybe heeled and he may not needing nursing anymore. She needs a new F2F visit and referral in order to start pt on their services.  TC to pt scheduled video visit for [place] 07/10/22 w/ PCP

## 2022-07-09 ENCOUNTER — Telehealth: Payer: Self-pay | Admitting: Family

## 2022-07-09 DIAGNOSIS — I87312 Chronic venous hypertension (idiopathic) with ulcer of left lower extremity: Secondary | ICD-10-CM | POA: Diagnosis not present

## 2022-07-09 DIAGNOSIS — L97821 Non-pressure chronic ulcer of other part of left lower leg limited to breakdown of skin: Secondary | ICD-10-CM | POA: Diagnosis not present

## 2022-07-09 DIAGNOSIS — E1140 Type 2 diabetes mellitus with diabetic neuropathy, unspecified: Secondary | ICD-10-CM | POA: Diagnosis not present

## 2022-07-09 DIAGNOSIS — I739 Peripheral vascular disease, unspecified: Secondary | ICD-10-CM | POA: Diagnosis not present

## 2022-07-09 DIAGNOSIS — I2510 Atherosclerotic heart disease of native coronary artery without angina pectoris: Secondary | ICD-10-CM | POA: Diagnosis not present

## 2022-07-09 DIAGNOSIS — I70244 Atherosclerosis of native arteries of left leg with ulceration of heel and midfoot: Secondary | ICD-10-CM | POA: Diagnosis not present

## 2022-07-09 DIAGNOSIS — I87311 Chronic venous hypertension (idiopathic) with ulcer of right lower extremity: Secondary | ICD-10-CM | POA: Diagnosis not present

## 2022-07-09 DIAGNOSIS — L97811 Non-pressure chronic ulcer of other part of right lower leg limited to breakdown of skin: Secondary | ICD-10-CM | POA: Diagnosis not present

## 2022-07-09 DIAGNOSIS — L97521 Non-pressure chronic ulcer of other part of left foot limited to breakdown of skin: Secondary | ICD-10-CM | POA: Diagnosis not present

## 2022-07-09 DIAGNOSIS — I50813 Acute on chronic right heart failure: Secondary | ICD-10-CM | POA: Diagnosis not present

## 2022-07-09 NOTE — Telephone Encounter (Signed)
Has appt tomorrow fyi

## 2022-07-10 ENCOUNTER — Encounter: Payer: Self-pay | Admitting: Family

## 2022-07-10 ENCOUNTER — Encounter: Payer: Self-pay | Admitting: *Deleted

## 2022-07-10 ENCOUNTER — Telehealth (INDEPENDENT_AMBULATORY_CARE_PROVIDER_SITE_OTHER): Payer: Medicare Other | Admitting: Family

## 2022-07-10 DIAGNOSIS — E1142 Type 2 diabetes mellitus with diabetic polyneuropathy: Secondary | ICD-10-CM

## 2022-07-10 DIAGNOSIS — S81802D Unspecified open wound, left lower leg, subsequent encounter: Secondary | ICD-10-CM

## 2022-07-10 DIAGNOSIS — R109 Unspecified abdominal pain: Secondary | ICD-10-CM | POA: Diagnosis not present

## 2022-07-10 DIAGNOSIS — I779 Disorder of arteries and arterioles, unspecified: Secondary | ICD-10-CM | POA: Diagnosis not present

## 2022-07-10 NOTE — Progress Notes (Signed)
Virtual Visit Consent   [name] [name], you are scheduled for a virtual visit with a [facility] provider today. Just as with appointments in the office, your consent must be obtained to participate. Your consent will be active for this visit and any virtual visit you may have with one of our providers in the next 365 days. If you have a MyChart account, a copy of this consent can be sent to you electronically.  As this is a virtual visit, video technology does not allow for your provider to perform a traditional examination. This may limit your provider's ability to fully assess your condition. If your provider identifies any concerns that need to be evaluated in person or the need to arrange testing (such as labs, EKG, etc.), we will make arrangements to do so. Although advances in technology are sophisticated, we cannot ensure that it will always work on either your end or our end. If the connection with a video visit is poor, the visit may have to be switched to a telephone visit. With either a video or telephone visit, we are not always able to ensure that we have a secure connection.  By engaging in this virtual visit, you consent to the provision of healthcare and authorize for your insurance to be billed (if applicable) for the services provided during this visit. Depending on your insurance coverage, you may receive a charge related to this service.  I need to obtain your verbal consent now. Are you willing to proceed with your visit today? [name] has provided verbal consent on 07/10/2022 for a virtual visit (video or telephone). [name], FNP  Date: 07/10/2022 12:01 PM  Virtual Visit via Video Note   I, [name], connected with  [name] [name]  ([id number], 01-Dec-1950) on 07/10/22 at 11:50 AM EST by a video-enabled telemedicine application and verified that I am speaking with the correct person using two identifiers.  Location: Patient: Virtual Visit Location Patient:  Home Provider: Virtual Visit Location Provider: Office/Clinic   I discussed the limitations of evaluation and management by telemedicine and the availability of in person appointments. The patient expressed understanding and agreed to proceed.    History of Present Illness: [name] [name] is a 72 y.o. who identifies as a male who was assigned male at birth, and is being seen today for to discuss home health. He has PAD and has bilateral wounds on bilateral legs. Home health had their last visit yesterday. Denies any fever or discharge.   He continues to have right flank pain. He was given prednisone that greatly helped this. States he can not tolerate baclofen because it makes him feel loopy.   HPI: Diabetes He presents for his follow-up diabetic visit. He has type 2 diabetes mellitus. Associated symptoms include foot paresthesias. Pertinent negatives for diabetes include no blurred vision. Symptoms are stable. Diabetic complications include a CVA, heart disease and peripheral neuropathy. Risk factors for coronary artery disease include dyslipidemia, diabetes mellitus, hypertension and male sex. He is following a generally unhealthy diet. His overall blood glucose range is >200 mg/dl.    Problems:  Patient Active Problem List   Diagnosis Date Noted   Dysphagia 03/17/2022   Cholecystitis, acute    Elevated lactic acid level    Generalized weakness    Elevated liver function tests 01/22/2022   Nausea and vomiting 01/22/2022   Choledocholithiasis 01/22/2022   Lactic acidosis 01/22/2022   Recurrent bacteremia 01/21/2022   Dry gangrene ([place]) 01/21/2022  Colonization status 01/21/2022   Pressure injury of skin 12/18/2021   SIRS (systemic inflammatory response syndrome) ([place]) 12/17/2021   Iron deficiency anemia 10/16/2021   Critical lower limb ischemia (HCC) 08/16/2021   Atherosclerosis of arteries of extremities ([place]) 08/15/2021   Coronary artery disease of native artery of native heart with  stable angina pectoris ([place])    Leg wound, left 07/25/2021   S/P femoral-popliteal bypass surgery 11/22/2020   PAOD (peripheral arterial occlusive disease) ([place]) 11/22/2020   Non-ST elevation (NSTEMI) myocardial infarction ([place]) 05/23/2020   Orthostatic hypotension 01/01/2020   Statin myopathy 12/06/2019   Diarrhea 05/10/2018   PAF (paroxysmal atrial fibrillation) ([place]) 03/29/2018   Lacunar infarction ([place]) 03/29/2018   Unstable angina (HCC)    Chest pain 01/18/2018   CHF (congestive heart failure) ([place]) 10/21/2017   Anxiety 03/09/2017   Constipation 11/26/2016   History of non-ST elevation myocardial infarction (NSTEMI) 07/29/2016   Diabetes ([place]) 05/25/2016   Occlusion and stenosis of vertebral artery    AKI (acute kidney injury) ([place]) 05/23/2016   Acute kidney failure, unspecified ([place]) 05/23/2016   Diabetic neuropathy ([place]) 01/13/2016   Depression 11/01/2015   Erectile dysfunction 01/23/2015   Class II obesity 01/23/2015   Periodontal disease 01/23/2015   Stopped smoking with greater than 30 pack year history 01/23/2015   Diabetic peripheral neuropathy associated with type 2 diabetes mellitus ([place]) 11/08/2014   Mucosal abnormality of stomach    History of adenomatous polyp of colon    Gastroesophageal reflux disease 09/06/2014   Low serum testosterone level 08/01/2014   DDD (degenerative disc disease), cervical 12/20/2013   Headache 08/07/2013   Medically noncompliant 10/25/2011   CAD S/P percutaneous coronary angioplasty 10/25/2011   MURMUR 04/16/2009   Hypertension associated with diabetes ([place]) 08/02/2008   Hyperlipidemia associated with type 2 diabetes mellitus ([place]) 07/31/2008   DISORDERS OF IRON METABOLISM 07/31/2008    Allergies:  Allergies  Allergen Reactions   Shellfish Allergy Anaphylaxis, Hives and Swelling    Tongue swelling    Sulfa Antibiotics Anaphylaxis, Hives and Swelling    Tongue swelling   Ace Inhibitors Other (See Comments) and Cough    CKD,  renal failure    Invokana [Canagliflozin] Other (See Comments)    Syncope Dehydration    Lexapro [Escitalopram] Other (See Comments)    Buzzing in ears Headaches "Felt like a zombie"   Metformin And Related Itching   Pravachol [Pravastatin Sodium] Other (See Comments)    Myalgias    Repatha [Evolocumab] Other (See Comments)    Myalgias Flu like symptoms    Tricor [Fenofibrate] Other (See Comments)    Myalgias     Zestril [Lisinopril] Cough   Farxiga [Dapagliflozin] Other (See Comments)    Nephro d/c'd   Crestor [Rosuvastatin] Other (See Comments)    Myalgias    Horse-Derived Products Rash    horse serum   Lipitor [Atorvastatin] Other (See Comments)    Myalgias    Livalo [Pitavastatin] Other (See Comments)    Myalgias    Milk ([place]) Diarrhea and Nausea Only    Stomach upset    Tape Rash   Medications:  Current Outpatient Medications:    albuterol (VENTOLIN HFA) 108 (90 Base) MCG/ACT inhaler, Inhale 2 puffs into the lungs every 6 (six) hours as needed for wheezing or shortness of breath., Disp: 8.5 g, Rfl: 0   apixaban (ELIQUIS) 5 MG TABS tablet, TAKE ONE (1) TABLET BY MOUTH TWICE DAILY, Disp: 60 tablet, Rfl: 2   baclofen (LIORESAL) 10  MG tablet, Take 0.5-1 tablets (5-10 mg total) by mouth 3 (three) times daily., Disp: 30 each, Rfl: 0   Calcium Carbonate Antacid (ALKA-SELTZER ANTACID PO), Take 1 tablet by mouth 2 (two) times daily as needed (heartburn, indigestion)., Disp: , Rfl:    clopidogrel (PLAVIX) 75 MG tablet, TAKE 1 TABLET BY MOUTH DAILY, Disp: 30 tablet, Rfl: 10   DULoxetine (CYMBALTA) 30 MG capsule, Take 3 capsules (90 mg total) by mouth daily., Disp: 120 capsule, Rfl: 1   ezetimibe (ZETIA) 10 MG tablet, TAKE 1 TABLET BY MOUTH DAILY, Disp: 90 tablet, Rfl: 3   fenofibrate 160 MG tablet, TAKE 1 TABLET BY MOUTH DAILY, Disp: 30 tablet, Rfl: 2   FEROSUL 325 (65 Fe) MG tablet, Take 325 mg by mouth every morning., Disp: , Rfl:    furosemide (LASIX) 20 MG tablet, TAKE 1  TABLET BY MOUTH TWICE DAILY FOR 7 DAYS THEN TAKE 1 TABLET BY MOUTH EVERY DAY, Disp: 30 tablet, Rfl: 3   gabapentin (NEURONTIN) 300 MG capsule, Take 3 capsules (900 mg total) by mouth 2 (two) times daily., Disp: 120 capsule, Rfl: 6   HYDROcodone-acetaminophen (NORCO) 10-325 MG tablet, Take 1 tablet by mouth every 6 (six) hours as needed for moderate pain., Disp: 60 tablet, Rfl: 0   insulin degludec (TRESIBA FLEXTOUCH) 200 UNIT/ML FlexTouch Pen, Inject 20 Units into the skin at bedtime., Disp: 9 mL, Rfl: 2   insulin lispro (HUMALOG) 100 UNIT/ML KwikPen, INJECT 15-20 UNITS SUBCUTANEOUSLY THREE TIMES A DAY WITH MEALS (Patient taking differently: Inject 15 Units into the skin 3 (three) times daily before meals.), Disp: 15 mL, Rfl: 10   insulin NPH-regular Human (HUMULIN 70/30) (70-30) 100 UNIT/ML injection, INJECT 60 UNITS SUBCUTANEOUSLY TWICE DAILY WITH MEAL, Disp: 30 mL, Rfl: 2   isosorbide mononitrate (IMDUR) 30 MG 24 hr tablet, TAKE 1 TABLET BY MOUTH DAILY, Disp: 30 tablet, Rfl: 10   Menthol, Topical Analgesic, (BLUE-EMU MAXIMUM STRENGTH EX), Apply 1 application  topically daily as needed (pain)., Disp: , Rfl:    metolazone (ZAROXOLYN) 5 MG tablet, Take 5 mg by mouth daily., Disp: , Rfl:    metoprolol succinate (TOPROL-XL) 50 MG 24 hr tablet, Take 1 tablet (50 mg total) by mouth daily., Disp: 30 tablet, Rfl: 3   metoprolol tartrate (LOPRESSOR) 50 MG tablet, Take 50 mg by mouth daily as needed (atrial fibrillation)., Disp: , Rfl:    nitroGLYCERIN (NITROSTAT) 0.4 MG SL tablet, DISSOLVE ONE TABLET UNDER THE TONGUE EVERY 5 MINUTES AS NEEDED FOR CHEST PAIN.  DO NOT EXCEED A TOTAL OF 3 DOSES IN 15 MINUTES Strength: 0.4 mg, Disp: 25 tablet, Rfl: 0   pantoprazole (PROTONIX) 40 MG tablet, Take 1 tablet (40 mg total) by mouth 2 (two) times daily. (Patient taking differently: Take 40 mg by mouth daily.), Disp: 60 tablet, Rfl: 0   polyethylene glycol-electrolytes (NULYTELY) 420 g solution, Take by mouth once., Disp:  , Rfl:    tirzepatide (MOUNJARO) 10 MG/0.5ML Pen, Inject 10 mg into the skin once a week., Disp: 6 mL, Rfl: 2   TRUEPLUS INSULIN SYRINGE 28G X 1/2" 1 ML MISC, USE TO GIVE INSULIN FIVE TIMES DAILY, Disp: 100 each, Rfl: 10  Observations/Objective: Patient is well-developed, well-nourished in no acute distress.  Resting comfortably  at home.  Head is normocephalic, atraumatic.  No labored breathing.  Speech is clear and coherent with logical content.  Patient is alert and oriented at baseline.  Scabbed lesion on lower left leg, no redness  Assessment and Plan:  1. Diabetic peripheral neuropathy associated with type 2 diabetes mellitus ([place])  2. Right flank pain  3. Wound of left lower extremity, subsequent encounter  4. PAOD (peripheral arterial occlusive disease) ([place])  Keep elevated Will hold off on Home Health referral at this time. Wife is very attentive to patient and will follow up with me if needed.  Strict low carb diet Continue medications  Keep chronic follow up  Follow Up Instructions: I discussed the assessment and treatment plan with the patient. The patient was provided an opportunity to ask questions and all were answered. The patient agreed with the plan and demonstrated an understanding of the instructions.  A copy of instructions were sent to the patient via MyChart unless otherwise noted below.    The patient was advised to call back or seek an in-person evaluation if the symptoms worsen or if the condition fails to improve as anticipated.  Time:  I spent 19 minutes with the patient via telehealth technology discussing the above problems/concerns.    [name], FNP

## 2022-07-16 DIAGNOSIS — I2510 Atherosclerotic heart disease of native coronary artery without angina pectoris: Secondary | ICD-10-CM | POA: Diagnosis not present

## 2022-07-16 DIAGNOSIS — E1140 Type 2 diabetes mellitus with diabetic neuropathy, unspecified: Secondary | ICD-10-CM | POA: Diagnosis not present

## 2022-07-16 DIAGNOSIS — I87312 Chronic venous hypertension (idiopathic) with ulcer of left lower extremity: Secondary | ICD-10-CM | POA: Diagnosis not present

## 2022-07-16 DIAGNOSIS — L97821 Non-pressure chronic ulcer of other part of left lower leg limited to breakdown of skin: Secondary | ICD-10-CM | POA: Diagnosis not present

## 2022-07-16 DIAGNOSIS — L97521 Non-pressure chronic ulcer of other part of left foot limited to breakdown of skin: Secondary | ICD-10-CM | POA: Diagnosis not present

## 2022-07-16 DIAGNOSIS — L97811 Non-pressure chronic ulcer of other part of right lower leg limited to breakdown of skin: Secondary | ICD-10-CM | POA: Diagnosis not present

## 2022-07-16 DIAGNOSIS — I50813 Acute on chronic right heart failure: Secondary | ICD-10-CM | POA: Diagnosis not present

## 2022-07-16 DIAGNOSIS — I70244 Atherosclerosis of native arteries of left leg with ulceration of heel and midfoot: Secondary | ICD-10-CM | POA: Diagnosis not present

## 2022-07-16 DIAGNOSIS — I739 Peripheral vascular disease, unspecified: Secondary | ICD-10-CM | POA: Diagnosis not present

## 2022-07-16 DIAGNOSIS — I87311 Chronic venous hypertension (idiopathic) with ulcer of right lower extremity: Secondary | ICD-10-CM | POA: Diagnosis not present

## 2022-07-28 DIAGNOSIS — I87312 Chronic venous hypertension (idiopathic) with ulcer of left lower extremity: Secondary | ICD-10-CM | POA: Diagnosis not present

## 2022-07-28 DIAGNOSIS — I50813 Acute on chronic right heart failure: Secondary | ICD-10-CM | POA: Diagnosis not present

## 2022-07-28 DIAGNOSIS — I87311 Chronic venous hypertension (idiopathic) with ulcer of right lower extremity: Secondary | ICD-10-CM | POA: Diagnosis not present

## 2022-07-28 DIAGNOSIS — L97811 Non-pressure chronic ulcer of other part of right lower leg limited to breakdown of skin: Secondary | ICD-10-CM | POA: Diagnosis not present

## 2022-07-28 DIAGNOSIS — I70244 Atherosclerosis of native arteries of left leg with ulceration of heel and midfoot: Secondary | ICD-10-CM | POA: Diagnosis not present

## 2022-07-28 DIAGNOSIS — I2510 Atherosclerotic heart disease of native coronary artery without angina pectoris: Secondary | ICD-10-CM | POA: Diagnosis not present

## 2022-07-28 DIAGNOSIS — L97821 Non-pressure chronic ulcer of other part of left lower leg limited to breakdown of skin: Secondary | ICD-10-CM | POA: Diagnosis not present

## 2022-07-28 DIAGNOSIS — L97521 Non-pressure chronic ulcer of other part of left foot limited to breakdown of skin: Secondary | ICD-10-CM | POA: Diagnosis not present

## 2022-07-28 DIAGNOSIS — E1140 Type 2 diabetes mellitus with diabetic neuropathy, unspecified: Secondary | ICD-10-CM | POA: Diagnosis not present

## 2022-07-28 DIAGNOSIS — I739 Peripheral vascular disease, unspecified: Secondary | ICD-10-CM | POA: Diagnosis not present

## 2022-07-30 ENCOUNTER — Other Ambulatory Visit: Payer: Self-pay | Admitting: Cardiovascular Disease

## 2022-08-03 DIAGNOSIS — L97821 Non-pressure chronic ulcer of other part of left lower leg limited to breakdown of skin: Secondary | ICD-10-CM | POA: Diagnosis not present

## 2022-08-03 DIAGNOSIS — I2510 Atherosclerotic heart disease of native coronary artery without angina pectoris: Secondary | ICD-10-CM | POA: Diagnosis not present

## 2022-08-03 DIAGNOSIS — E1140 Type 2 diabetes mellitus with diabetic neuropathy, unspecified: Secondary | ICD-10-CM | POA: Diagnosis not present

## 2022-08-03 DIAGNOSIS — I87311 Chronic venous hypertension (idiopathic) with ulcer of right lower extremity: Secondary | ICD-10-CM | POA: Diagnosis not present

## 2022-08-03 DIAGNOSIS — I50813 Acute on chronic right heart failure: Secondary | ICD-10-CM | POA: Diagnosis not present

## 2022-08-03 DIAGNOSIS — I87312 Chronic venous hypertension (idiopathic) with ulcer of left lower extremity: Secondary | ICD-10-CM | POA: Diagnosis not present

## 2022-08-03 DIAGNOSIS — I739 Peripheral vascular disease, unspecified: Secondary | ICD-10-CM | POA: Diagnosis not present

## 2022-08-03 DIAGNOSIS — L97811 Non-pressure chronic ulcer of other part of right lower leg limited to breakdown of skin: Secondary | ICD-10-CM | POA: Diagnosis not present

## 2022-08-03 DIAGNOSIS — L97521 Non-pressure chronic ulcer of other part of left foot limited to breakdown of skin: Secondary | ICD-10-CM | POA: Diagnosis not present

## 2022-08-03 DIAGNOSIS — I70244 Atherosclerosis of native arteries of left leg with ulceration of heel and midfoot: Secondary | ICD-10-CM | POA: Diagnosis not present

## 2022-08-04 ENCOUNTER — Telehealth: Payer: Self-pay | Admitting: Family

## 2022-08-04 DIAGNOSIS — L97821 Non-pressure chronic ulcer of other part of left lower leg limited to breakdown of skin: Secondary | ICD-10-CM | POA: Diagnosis not present

## 2022-08-04 DIAGNOSIS — F331 Major depressive disorder, recurrent, moderate: Secondary | ICD-10-CM

## 2022-08-04 DIAGNOSIS — E1142 Type 2 diabetes mellitus with diabetic polyneuropathy: Secondary | ICD-10-CM

## 2022-08-04 DIAGNOSIS — S91301D Unspecified open wound, right foot, subsequent encounter: Secondary | ICD-10-CM | POA: Diagnosis not present

## 2022-08-04 MED ORDER — GABAPENTIN 300 MG PO CAPS: 900.0000 mg | ORAL_CAPSULE | Freq: Two times a day (BID) | ORAL | 3 refills | Status: DC

## 2022-08-04 MED ORDER — DULOXETINE HCL 60 MG PO CPEP: 60.0000 mg | ORAL_CAPSULE | Freq: Every day | ORAL | 3 refills | Status: DC

## 2022-08-04 NOTE — Telephone Encounter (Signed)
Prescription sent to pharmacy.

## 2022-08-04 NOTE — Telephone Encounter (Signed)
Fax from [facility]: pt's Duloxetine, Pt states he is taking 2 cap daily. PCP sent in 60 mg 1 QD, instead of 30 mg 2 cap QD.

## 2022-08-06 ENCOUNTER — Ambulatory Visit: Payer: Self-pay | Admitting: *Deleted

## 2022-08-06 NOTE — Patient Outreach (Addendum)
  Care Coordination   Follow Up Visit Note   08/06/2022 Name: [name] [name] MRN: [id number] DOB: [birth year]  [name] [name] is a 72 y.o. year old male who sees [place], Theador Hawthorne, FNP for primary care. I spoke with  [name] [name] and his wife, [name] [name] by phone today.  What matters to the patients health and wellness today?  Poor sleep related to diuretics and stress (working on dryer)  Updated RN CM that he has not had further falls since last RN CM outreach Diabetes- 06/16/22 HgA1c was 11.6 symptoms of foot paresthesias, continues with an unhealthy diet Ran out of [place] & issues with insurance covering the [place]  Had an annual [facility] RN home visit in the last few weeks in March 2024 that re check his HgA1c to confirmed it was 8.4    Goals Addressed               This Visit's Progress     Patient Stated     [facility]) (pt-stated)   On track     Care Coordination Interventions: Assessed for falls since last encounter No falls reported since last outreach      improved uncontrolled diabetes ([facility]) (pt-stated)   On track     Care Coordination Interventions: Provided education to patient about basic DM disease process Discussed plans with patient for ongoing care management follow up and provided patient with direct contact information for care management team Had an annual [facility] RN home visit in the last few weeks in March 2024 that re check his HgA1c to confirmed it was 8.4  Interventions Today    Flowsheet Row Most Recent Value  Chronic Disease   Chronic disease during today's visit Other, Diabetes  [falls, poor sleep]  General Interventions   General Interventions Discussed/Reviewed General Interventions Reviewed, Doctor Visits  Doctor Visits Discussed/Reviewed Doctor Visits Reviewed, PCP, Specialist  Education Interventions   Education Provided Provided Education  [06/16/22 HgA1c was 11.6 Was out of [place] & there were issues with insurance covering it]   Provided Verbal Education On Other, Blood Sugar Monitoring, Medication  [allowed him to ventilate]  Nutrition Interventions   Nutrition Discussed/Reviewed Nutrition Reviewed, Decreasing sugar intake  Safety Interventions   Safety Discussed/Reviewed Safety Reviewed, Fall Risk            SDOH assessments and interventions completed:  No     Care Coordination Interventions:  Yes, provided   Follow up plan: Follow up call scheduled for 09/22/22    Encounter Outcome:  Pt. Visit Completed   Messiah Ahr L. Lavina Hamman, RN, BSN, [place] Coordinator Office number [phone]

## 2022-08-06 NOTE — Patient Instructions (Addendum)
Visit Information  Thank you for taking time to visit with me today. Please don't hesitate to contact me if I can be of assistance to you.   Following are the goals we discussed today:   Goals Addressed               This Visit's Progress     Patient Stated     Fall [facility]) (pt-stated)   On track     Care Coordination Interventions: Assessed for falls since last encounter No falls reported since last outreach      improved uncontrolled diabetes [facility]) (pt-stated)   On track     Care Coordination Interventions: Provided education to patient about basic DM disease process Discussed plans with patient for ongoing care management follow up and provided patient with direct contact information for care management team Had an annual [facility] RN home visit in the last few weeks in March 2024 that re check his HgA1c to confirmed it was 8.4  Interventions Today    Flowsheet Row Most Recent Value  Chronic Disease   Chronic disease during today's visit Other, Diabetes  [falls, poor sleep]  General Interventions   General Interventions Discussed/Reviewed General Interventions Reviewed, Doctor Visits  Doctor Visits Discussed/Reviewed Doctor Visits Reviewed, PCP, Specialist  Education Interventions   Education Provided Provided Education  [06/16/22 HgA1c was 11.6 Was out of [place] & there were issues with insurance covering it]  Provided Verbal Education On Other, Blood Sugar Monitoring, Medication  [allowed him to ventilate]  Nutrition Interventions   Nutrition Discussed/Reviewed Nutrition Reviewed, Decreasing sugar intake  Safety Interventions   Safety Discussed/Reviewed Safety Reviewed, Fall Risk            Our next appointment is by telephone on 09/22/22 at 1015  Please call the care guide team at [phone] if you need to cancel or reschedule your appointment.   If you are experiencing a Mental Health or [place] or need someone to talk to, please call  the Suicide and Crisis Lifeline: 988 call the [place] National Suicide Prevention Lifeline: 951-844-2096 or TTY: 859-045-9062 TTY (412)333-5265) to talk to a trained counselor call 1-800-273-TALK (toll free, 24 hour hotline) call the [facility]: [phone] call 911   Patient verbalizes understanding of instructions and care plan provided today and agrees to view in [place]. Active MyChart status and patient understanding of how to access instructions and care plan via MyChart confirmed with patient.     The patient has been provided with contact information for the care management team and has been advised to call with any health related questions or concerns.   Jonathan Corpus L. Lavina Hamman, RN, BSN, [place] Coordinator Office number [phone]

## 2022-08-11 ENCOUNTER — Other Ambulatory Visit: Payer: Self-pay | Admitting: Family Medicine

## 2022-08-11 ENCOUNTER — Telehealth (INDEPENDENT_AMBULATORY_CARE_PROVIDER_SITE_OTHER): Payer: Medicare Other | Admitting: Family

## 2022-08-11 ENCOUNTER — Encounter: Payer: Self-pay | Admitting: Family

## 2022-08-11 DIAGNOSIS — I50813 Acute on chronic right heart failure: Secondary | ICD-10-CM | POA: Diagnosis not present

## 2022-08-11 DIAGNOSIS — M25571 Pain in right ankle and joints of right foot: Secondary | ICD-10-CM

## 2022-08-11 DIAGNOSIS — I70244 Atherosclerosis of native arteries of left leg with ulceration of heel and midfoot: Secondary | ICD-10-CM | POA: Diagnosis not present

## 2022-08-11 DIAGNOSIS — E1140 Type 2 diabetes mellitus with diabetic neuropathy, unspecified: Secondary | ICD-10-CM | POA: Diagnosis not present

## 2022-08-11 DIAGNOSIS — I87312 Chronic venous hypertension (idiopathic) with ulcer of left lower extremity: Secondary | ICD-10-CM | POA: Diagnosis not present

## 2022-08-11 DIAGNOSIS — L97521 Non-pressure chronic ulcer of other part of left foot limited to breakdown of skin: Secondary | ICD-10-CM | POA: Diagnosis not present

## 2022-08-11 DIAGNOSIS — L97821 Non-pressure chronic ulcer of other part of left lower leg limited to breakdown of skin: Secondary | ICD-10-CM | POA: Diagnosis not present

## 2022-08-11 DIAGNOSIS — E1142 Type 2 diabetes mellitus with diabetic polyneuropathy: Secondary | ICD-10-CM | POA: Diagnosis not present

## 2022-08-11 DIAGNOSIS — I2510 Atherosclerotic heart disease of native coronary artery without angina pectoris: Secondary | ICD-10-CM | POA: Diagnosis not present

## 2022-08-11 DIAGNOSIS — I739 Peripheral vascular disease, unspecified: Secondary | ICD-10-CM | POA: Diagnosis not present

## 2022-08-11 DIAGNOSIS — L97811 Non-pressure chronic ulcer of other part of right lower leg limited to breakdown of skin: Secondary | ICD-10-CM | POA: Diagnosis not present

## 2022-08-11 DIAGNOSIS — I87311 Chronic venous hypertension (idiopathic) with ulcer of right lower extremity: Secondary | ICD-10-CM | POA: Diagnosis not present

## 2022-08-11 MED ORDER — HYDROCODONE-ACETAMINOPHEN 10-325 MG PO TABS: 1.0000 | ORAL_TABLET | Freq: Four times a day (QID) | ORAL | 0 refills | Status: DC | PRN

## 2022-08-11 NOTE — Progress Notes (Signed)
Virtual Visit Consent   [name] [name], you are scheduled for a virtual visit with a [facility] provider today. Just as with appointments in the office, your consent must be obtained to participate. Your consent will be active for this visit and any virtual visit you may have with one of our providers in the next 365 days. If you have a MyChart account, a copy of this consent can be sent to you electronically.  As this is a virtual visit, video technology does not allow for your provider to perform a traditional examination. This may limit your provider's ability to fully assess your condition. If your provider identifies any concerns that need to be evaluated in person or the need to arrange testing (such as labs, EKG, etc.), we will make arrangements to do so. Although advances in technology are sophisticated, we cannot ensure that it will always work on either your end or our end. If the connection with a video visit is poor, the visit may have to be switched to a telephone visit. With either a video or telephone visit, we are not always able to ensure that we have a secure connection.  By engaging in this virtual visit, you consent to the provision of healthcare and authorize for your insurance to be billed (if applicable) for the services provided during this visit. Depending on your insurance coverage, you may receive a charge related to this service.  I need to obtain your verbal consent now. Are you willing to proceed with your visit today? [name] [name] has provided verbal consent on 08/11/2022 for a virtual visit (video or telephone). [name], FNP  Date: 08/11/2022 2:07 PM  Virtual Visit via Video Note   I, [name], connected with  [name] [name]  ([id number], 04-05-1951) on 08/11/22 at 11:25 AM EDT by a video-enabled telemedicine application and verified that I am speaking with the correct person using two identifiers.  Location: Patient: Virtual Visit Location Patient:  Home Provider: Virtual Visit Location Provider: Office/Clinic   I discussed the limitations of evaluation and management by telemedicine and the availability of in person appointments. The patient expressed understanding and agreed to proceed.    History of Present Illness: [name] [name] is a 72 y.o. who identifies as a male who was assigned male at birth, and is being seen today for left ankle and left foot that started yesterday. He has taken gabapentin 900 mg, Norco, and tylenol without relief. Reports mild swelling. Denies any erythemas, heat, or fevers.   Reports the pain constant sharp burning pain in foot, ankle that radiates to his knee.   HPI: Ankle Pain  The incident occurred 2 days ago. There was no injury mechanism. The pain is present in the left ankle. The quality of the pain is described as burning and aching. The pain is moderate. The pain has been Worsening since onset. Associated symptoms include an inability to bear weight, numbness and tingling. He reports no foreign bodies present. The symptoms are aggravated by movement and weight bearing. He has tried NSAIDs, rest and acetaminophen for the symptoms. The treatment provided mild relief.    Problems:  Patient Active Problem List   Diagnosis Date Noted   Dysphagia 03/17/2022   Cholecystitis, acute    Elevated lactic acid level    Generalized weakness    Elevated liver function tests 01/22/2022   Nausea and vomiting 01/22/2022   Choledocholithiasis 01/22/2022   Lactic acidosis 01/22/2022   Recurrent bacteremia 01/21/2022  Dry gangrene ([place]) 01/21/2022   Colonization status 01/21/2022   Pressure injury of skin 12/18/2021   SIRS (systemic inflammatory response syndrome) ([place]) 12/17/2021   Iron deficiency anemia 10/16/2021   Critical lower limb ischemia ([place]) 08/16/2021   Atherosclerosis of arteries of extremities ([place]) 08/15/2021   Coronary artery disease of native artery of native heart with stable angina pectoris  ([place])    Leg wound, left 07/25/2021   S/P femoral-popliteal bypass surgery 11/22/2020   PAOD (peripheral arterial occlusive disease) ([place]) 11/22/2020   Non-ST elevation (NSTEMI) myocardial infarction ([place]) 05/23/2020   Orthostatic hypotension 01/01/2020   Statin myopathy 12/06/2019   Diarrhea 05/10/2018   PAF (paroxysmal atrial fibrillation) ([place]) 03/29/2018   Lacunar infarction ([place]) 03/29/2018   Unstable angina (HCC)    Chest pain 01/18/2018   CHF (congestive heart failure) ([place]) 10/21/2017   Anxiety 03/09/2017   Constipation 11/26/2016   History of non-ST elevation myocardial infarction (NSTEMI) 07/29/2016   Diabetes ([place]) 05/25/2016   Occlusion and stenosis of vertebral artery    AKI (acute kidney injury) ([place]) 05/23/2016   Acute kidney failure, unspecified ([place]) 05/23/2016   Diabetic neuropathy ([place]) 01/13/2016   Depression 11/01/2015   Erectile dysfunction 01/23/2015   Class II obesity 01/23/2015   Periodontal disease 01/23/2015   Stopped smoking with greater than 30 pack year history 01/23/2015   Diabetic peripheral neuropathy associated with type 2 diabetes mellitus ([place]) 11/08/2014   Mucosal abnormality of stomach    History of adenomatous polyp of colon    Gastroesophageal reflux disease 09/06/2014   Low serum testosterone level 08/01/2014   DDD (degenerative disc disease), cervical 12/20/2013   Headache 08/07/2013   Medically noncompliant 10/25/2011   CAD S/P percutaneous coronary angioplasty 10/25/2011   MURMUR 04/16/2009   Hypertension associated with diabetes ([place]) 08/02/2008   Hyperlipidemia associated with type 2 diabetes mellitus ([place]) 07/31/2008   DISORDERS OF IRON METABOLISM 07/31/2008    Allergies:  Allergies  Allergen Reactions   Shellfish Allergy Anaphylaxis, Hives and Swelling    Tongue swelling    Sulfa Antibiotics Anaphylaxis, Hives and Swelling    Tongue swelling   Ace Inhibitors Other (See Comments) and Cough    CKD, renal failure     Invokana [Canagliflozin] Other (See Comments)    Syncope Dehydration    Lexapro [Escitalopram] Other (See Comments)    Buzzing in ears Headaches "Felt like a zombie"   Metformin And Related Itching   Pravachol [Pravastatin Sodium] Other (See Comments)    Myalgias    Repatha [Evolocumab] Other (See Comments)    Myalgias Flu like symptoms    Tricor [Fenofibrate] Other (See Comments)    Myalgias     Zestril [Lisinopril] Cough   Farxiga [Dapagliflozin] Other (See Comments)    Nephro d/c'd   Crestor [Rosuvastatin] Other (See Comments)    Myalgias    Horse-Derived Products Rash    horse serum   Lipitor [Atorvastatin] Other (See Comments)    Myalgias    Livalo [Pitavastatin] Other (See Comments)    Myalgias    Milk ([place]) Diarrhea and Nausea Only    Stomach upset    Tape Rash   Medications:  Current Outpatient Medications:    albuterol (VENTOLIN HFA) 108 (90 Base) MCG/ACT inhaler, Inhale 2 puffs into the lungs every 6 (six) hours as needed for wheezing or shortness of breath., Disp: 8.5 g, Rfl: 0   apixaban (ELIQUIS) 5 MG TABS tablet, TAKE ONE (1) TABLET BY MOUTH TWICE DAILY, Disp: 60 tablet, Rfl:  2   baclofen (LIORESAL) 10 MG tablet, Take 0.5-1 tablets (5-10 mg total) by mouth 3 (three) times daily., Disp: 30 each, Rfl: 0   Calcium Carbonate Antacid (ALKA-SELTZER ANTACID PO), Take 1 tablet by mouth 2 (two) times daily as needed (heartburn, indigestion)., Disp: , Rfl:    clopidogrel (PLAVIX) 75 MG tablet, TAKE 1 TABLET BY MOUTH DAILY, Disp: 30 tablet, Rfl: 10   DULoxetine (CYMBALTA) 60 MG capsule, Take 1 capsule (60 mg total) by mouth daily., Disp: 90 capsule, Rfl: 3   ezetimibe (ZETIA) 10 MG tablet, TAKE 1 TABLET BY MOUTH DAILY, Disp: 90 tablet, Rfl: 3   fenofibrate 160 MG tablet, TAKE 1 TABLET BY MOUTH DAILY, Disp: 30 tablet, Rfl: 2   FEROSUL 325 (65 Fe) MG tablet, Take 325 mg by mouth every morning., Disp: , Rfl:    furosemide (LASIX) 20 MG tablet, TAKE 1 TABLET BY MOUTH  TWICE DAILY FOR 7 DAYS THEN TAKE 1 TABLET BY MOUTH EVERY DAY, Disp: 30 tablet, Rfl: 3   gabapentin (NEURONTIN) 300 MG capsule, Take 3 capsules (900 mg total) by mouth 2 (two) times daily., Disp: 180 capsule, Rfl: 3   HYDROcodone-acetaminophen (NORCO) 10-325 MG tablet, Take 1 tablet by mouth every 6 (six) hours as needed for moderate pain., Disp: 90 tablet, Rfl: 0   insulin degludec (TRESIBA FLEXTOUCH) 200 UNIT/ML FlexTouch Pen, Inject 20 Units into the skin at bedtime., Disp: 9 mL, Rfl: 2   insulin lispro (HUMALOG) 100 UNIT/ML KwikPen, INJECT 15-20 UNITS SUBCUTANEOUSLY THREE TIMES A DAY WITH MEALS (Patient taking differently: Inject 15 Units into the skin 3 (three) times daily before meals.), Disp: 15 mL, Rfl: 10   insulin NPH-regular Human (HUMULIN 70/30) (70-30) 100 UNIT/ML injection, INJECT 60 UNITS SUBCUTANEOUSLY TWICE DAILY WITH MEAL, Disp: 30 mL, Rfl: 2   isosorbide mononitrate (IMDUR) 30 MG 24 hr tablet, TAKE 1 TABLET BY MOUTH DAILY, Disp: 30 tablet, Rfl: 10   Menthol, Topical Analgesic, (BLUE-EMU MAXIMUM STRENGTH EX), Apply 1 application  topically daily as needed (pain)., Disp: , Rfl:    metolazone (ZAROXOLYN) 5 MG tablet, Take 5 mg by mouth daily., Disp: , Rfl:    metoprolol succinate (TOPROL-XL) 50 MG 24 hr tablet, TAKE 1 TABLET BY MOUTH DAILY, Disp: 90 tablet, Rfl: 3   metoprolol tartrate (LOPRESSOR) 50 MG tablet, Take 50 mg by mouth daily as needed (atrial fibrillation)., Disp: , Rfl:    nitroGLYCERIN (NITROSTAT) 0.4 MG SL tablet, DISSOLVE ONE TABLET UNDER THE TONGUE EVERY 5 MINUTES AS NEEDED FOR CHEST PAIN.  DO NOT EXCEED A TOTAL OF 3 DOSES IN 15 MINUTES Strength: 0.4 mg, Disp: 25 tablet, Rfl: 0   pantoprazole (PROTONIX) 40 MG tablet, Take 1 tablet (40 mg total) by mouth 2 (two) times daily. (Patient taking differently: Take 40 mg by mouth daily.), Disp: 60 tablet, Rfl: 0   polyethylene glycol-electrolytes (NULYTELY) 420 g solution, Take by mouth once., Disp: , Rfl:    tirzepatide  (MOUNJARO) 10 MG/0.5ML Pen, Inject 10 mg into the skin once a week., Disp: 6 mL, Rfl: 2   TRUEPLUS INSULIN SYRINGE 28G X 1/2" 1 ML MISC, USE TO GIVE INSULIN FIVE TIMES DAILY, Disp: 100 each, Rfl: 10  Observations/Objective: Patient is well-developed, well-nourished in no acute distress.  Resting comfortably  at home.  Head is normocephalic, atraumatic.  No labored breathing.  Speech is clear and coherent with logical content.  Patient is alert and oriented at baseline.  Left ankle swelling   Assessment and Plan: 1.  Acute right ankle pain - HYDROcodone-acetaminophen (NORCO) 10-325 MG tablet; Take 1 tablet by mouth every 6 (six) hours as needed for moderate pain.  Dispense: 90 tablet; Refill: 0  2. Diabetic peripheral neuropathy associated with type 2 diabetes mellitus (HCC)  Continue Gabapentin  Norco as needed  Low carb diet Need to have good control of glucose Keep chronic follow up  Follow Up Instructions: I discussed the assessment and treatment plan with the patient. The patient was provided an opportunity to ask questions and all were answered. The patient agreed with the plan and demonstrated an understanding of the instructions.  A copy of instructions were sent to the patient via MyChart unless otherwise noted below.     The patient was advised to call back or seek an in-person evaluation if the symptoms worsen or if the condition fails to improve as anticipated.  Time:  I spent 12 minutes with the patient via telehealth technology discussing the above problems/concerns.    [name], FNP

## 2022-08-18 DIAGNOSIS — E1140 Type 2 diabetes mellitus with diabetic neuropathy, unspecified: Secondary | ICD-10-CM | POA: Diagnosis not present

## 2022-08-18 DIAGNOSIS — I70244 Atherosclerosis of native arteries of left leg with ulceration of heel and midfoot: Secondary | ICD-10-CM | POA: Diagnosis not present

## 2022-08-18 DIAGNOSIS — L97811 Non-pressure chronic ulcer of other part of right lower leg limited to breakdown of skin: Secondary | ICD-10-CM | POA: Diagnosis not present

## 2022-08-18 DIAGNOSIS — I2510 Atherosclerotic heart disease of native coronary artery without angina pectoris: Secondary | ICD-10-CM | POA: Diagnosis not present

## 2022-08-18 DIAGNOSIS — I87311 Chronic venous hypertension (idiopathic) with ulcer of right lower extremity: Secondary | ICD-10-CM | POA: Diagnosis not present

## 2022-08-18 DIAGNOSIS — I739 Peripheral vascular disease, unspecified: Secondary | ICD-10-CM | POA: Diagnosis not present

## 2022-08-18 DIAGNOSIS — L97521 Non-pressure chronic ulcer of other part of left foot limited to breakdown of skin: Secondary | ICD-10-CM | POA: Diagnosis not present

## 2022-08-18 DIAGNOSIS — I87312 Chronic venous hypertension (idiopathic) with ulcer of left lower extremity: Secondary | ICD-10-CM | POA: Diagnosis not present

## 2022-08-18 DIAGNOSIS — L97821 Non-pressure chronic ulcer of other part of left lower leg limited to breakdown of skin: Secondary | ICD-10-CM | POA: Diagnosis not present

## 2022-08-18 DIAGNOSIS — I50813 Acute on chronic right heart failure: Secondary | ICD-10-CM | POA: Diagnosis not present

## 2022-08-26 DIAGNOSIS — I2510 Atherosclerotic heart disease of native coronary artery without angina pectoris: Secondary | ICD-10-CM | POA: Diagnosis not present

## 2022-08-26 DIAGNOSIS — L97521 Non-pressure chronic ulcer of other part of left foot limited to breakdown of skin: Secondary | ICD-10-CM | POA: Diagnosis not present

## 2022-08-26 DIAGNOSIS — I739 Peripheral vascular disease, unspecified: Secondary | ICD-10-CM | POA: Diagnosis not present

## 2022-08-26 DIAGNOSIS — I70244 Atherosclerosis of native arteries of left leg with ulceration of heel and midfoot: Secondary | ICD-10-CM | POA: Diagnosis not present

## 2022-08-26 DIAGNOSIS — I87311 Chronic venous hypertension (idiopathic) with ulcer of right lower extremity: Secondary | ICD-10-CM | POA: Diagnosis not present

## 2022-08-26 DIAGNOSIS — I50813 Acute on chronic right heart failure: Secondary | ICD-10-CM | POA: Diagnosis not present

## 2022-08-26 DIAGNOSIS — L97821 Non-pressure chronic ulcer of other part of left lower leg limited to breakdown of skin: Secondary | ICD-10-CM | POA: Diagnosis not present

## 2022-08-26 DIAGNOSIS — L97811 Non-pressure chronic ulcer of other part of right lower leg limited to breakdown of skin: Secondary | ICD-10-CM | POA: Diagnosis not present

## 2022-08-26 DIAGNOSIS — E1140 Type 2 diabetes mellitus with diabetic neuropathy, unspecified: Secondary | ICD-10-CM | POA: Diagnosis not present

## 2022-08-26 DIAGNOSIS — I87312 Chronic venous hypertension (idiopathic) with ulcer of left lower extremity: Secondary | ICD-10-CM | POA: Diagnosis not present

## 2022-09-02 DIAGNOSIS — I2510 Atherosclerotic heart disease of native coronary artery without angina pectoris: Secondary | ICD-10-CM | POA: Diagnosis not present

## 2022-09-02 DIAGNOSIS — L97821 Non-pressure chronic ulcer of other part of left lower leg limited to breakdown of skin: Secondary | ICD-10-CM | POA: Diagnosis not present

## 2022-09-02 DIAGNOSIS — I70244 Atherosclerosis of native arteries of left leg with ulceration of heel and midfoot: Secondary | ICD-10-CM | POA: Diagnosis not present

## 2022-09-02 DIAGNOSIS — I739 Peripheral vascular disease, unspecified: Secondary | ICD-10-CM | POA: Diagnosis not present

## 2022-09-02 DIAGNOSIS — I50813 Acute on chronic right heart failure: Secondary | ICD-10-CM | POA: Diagnosis not present

## 2022-09-02 DIAGNOSIS — I87311 Chronic venous hypertension (idiopathic) with ulcer of right lower extremity: Secondary | ICD-10-CM | POA: Diagnosis not present

## 2022-09-02 DIAGNOSIS — E1140 Type 2 diabetes mellitus with diabetic neuropathy, unspecified: Secondary | ICD-10-CM | POA: Diagnosis not present

## 2022-09-02 DIAGNOSIS — I87312 Chronic venous hypertension (idiopathic) with ulcer of left lower extremity: Secondary | ICD-10-CM | POA: Diagnosis not present

## 2022-09-02 DIAGNOSIS — L97521 Non-pressure chronic ulcer of other part of left foot limited to breakdown of skin: Secondary | ICD-10-CM | POA: Diagnosis not present

## 2022-09-02 DIAGNOSIS — L97811 Non-pressure chronic ulcer of other part of right lower leg limited to breakdown of skin: Secondary | ICD-10-CM | POA: Diagnosis not present

## 2022-09-03 ENCOUNTER — Other Ambulatory Visit: Payer: Self-pay | Admitting: Family

## 2022-09-03 DIAGNOSIS — I480 Paroxysmal atrial fibrillation: Secondary | ICD-10-CM

## 2022-09-09 ENCOUNTER — Telehealth: Payer: Self-pay | Admitting: Family

## 2022-09-09 DIAGNOSIS — I50813 Acute on chronic right heart failure: Secondary | ICD-10-CM | POA: Diagnosis not present

## 2022-09-09 DIAGNOSIS — E1140 Type 2 diabetes mellitus with diabetic neuropathy, unspecified: Secondary | ICD-10-CM | POA: Diagnosis not present

## 2022-09-09 DIAGNOSIS — I87312 Chronic venous hypertension (idiopathic) with ulcer of left lower extremity: Secondary | ICD-10-CM | POA: Diagnosis not present

## 2022-09-09 DIAGNOSIS — L97811 Non-pressure chronic ulcer of other part of right lower leg limited to breakdown of skin: Secondary | ICD-10-CM | POA: Diagnosis not present

## 2022-09-09 DIAGNOSIS — I2510 Atherosclerotic heart disease of native coronary artery without angina pectoris: Secondary | ICD-10-CM | POA: Diagnosis not present

## 2022-09-09 DIAGNOSIS — I87311 Chronic venous hypertension (idiopathic) with ulcer of right lower extremity: Secondary | ICD-10-CM | POA: Diagnosis not present

## 2022-09-09 DIAGNOSIS — I739 Peripheral vascular disease, unspecified: Secondary | ICD-10-CM | POA: Diagnosis not present

## 2022-09-09 DIAGNOSIS — L97821 Non-pressure chronic ulcer of other part of left lower leg limited to breakdown of skin: Secondary | ICD-10-CM | POA: Diagnosis not present

## 2022-09-09 DIAGNOSIS — I70244 Atherosclerosis of native arteries of left leg with ulceration of heel and midfoot: Secondary | ICD-10-CM | POA: Diagnosis not present

## 2022-09-09 DIAGNOSIS — L97521 Non-pressure chronic ulcer of other part of left foot limited to breakdown of skin: Secondary | ICD-10-CM | POA: Diagnosis not present

## 2022-09-09 NOTE — Telephone Encounter (Signed)
Pt states that one month ago he weighed 269lb  He denies edema in extremities or his abdomen. He is urinating regularly.  Advised to continue is diuretic and keep drinking water. Pt also drinks sugar free power aid. He can take HA med prn.  Pt has an appt in the office 4/30.  Will send message to Dr. Louanne Skye to see if further recommendations are needed.

## 2022-09-09 NOTE — Telephone Encounter (Signed)
Tonya called from [facility] with report.  Says she is visiting patient and says pt is complaining of having a lot of pain in his neck and head. Says the pain is a 10 out of 10. Also wanted to report that pts weight is 291.

## 2022-09-09 NOTE — Telephone Encounter (Signed)
Yes I think patient needs to be seen ASAP to see what is going on.  He can take Tylenol for headaches as needed and make sure he stays hydrated but it sounds like there is something more going on there with his weight gain.  If things worsen or he gets short of breath and having go to the emergency department.

## 2022-09-10 ENCOUNTER — Other Ambulatory Visit: Payer: Self-pay | Admitting: Family Medicine

## 2022-09-10 ENCOUNTER — Ambulatory Visit (INDEPENDENT_AMBULATORY_CARE_PROVIDER_SITE_OTHER): Payer: Medicare Other

## 2022-09-10 ENCOUNTER — Ambulatory Visit (INDEPENDENT_AMBULATORY_CARE_PROVIDER_SITE_OTHER): Payer: Medicare Other | Admitting: Family

## 2022-09-10 ENCOUNTER — Encounter: Payer: Self-pay | Admitting: Family

## 2022-09-10 VITALS — BP 101/55 | HR 72 | Temp 97.6°F | Ht 75.0 in | Wt 294.0 lb

## 2022-09-10 DIAGNOSIS — R0602 Shortness of breath: Secondary | ICD-10-CM | POA: Diagnosis not present

## 2022-09-10 DIAGNOSIS — R051 Acute cough: Secondary | ICD-10-CM

## 2022-09-10 DIAGNOSIS — R531 Weakness: Secondary | ICD-10-CM | POA: Diagnosis not present

## 2022-09-10 DIAGNOSIS — I5020 Unspecified systolic (congestive) heart failure: Secondary | ICD-10-CM

## 2022-09-10 DIAGNOSIS — R231 Pallor: Secondary | ICD-10-CM

## 2022-09-10 DIAGNOSIS — R635 Abnormal weight gain: Secondary | ICD-10-CM | POA: Diagnosis not present

## 2022-09-10 DIAGNOSIS — Z91199 Patient's noncompliance with other medical treatment and regimen due to unspecified reason: Secondary | ICD-10-CM | POA: Diagnosis not present

## 2022-09-10 DIAGNOSIS — I779 Disorder of arteries and arterioles, unspecified: Secondary | ICD-10-CM

## 2022-09-10 MED ORDER — FUROSEMIDE 40 MG PO TABS: 40.0000 mg | ORAL_TABLET | Freq: Every day | ORAL | 1 refills | Status: DC

## 2022-09-10 MED ORDER — DOXYCYCLINE HYCLATE 100 MG PO TABS: 100.0000 mg | ORAL_TABLET | Freq: Two times a day (BID) | ORAL | 0 refills | Status: DC

## 2022-09-10 NOTE — Progress Notes (Signed)
Subjective:    Patient ID: [name] [name], male    DOB: 1950/07/19, 72 y.o.   MRN: [id number]  Chief Complaint  Patient presents with   fluid    Gained 22lbs since a couple of weeks could not get on scaled here    Fatigue    Last couple of days.   head and neck pain   Pt presents to the office today with 16 lb weight gain, fatigue, and SOB. He is currently taking lasix 20 mg daily.     09/10/2022    2:19 PM 06/18/2022   11:26 AM 05/04/2022    2:11 PM  Last 3 Weights  Weight (lbs) 294 lb 278 lb 267 lb  Weight (kg) 133.358 kg 126.1 kg 121.11 kg    He has hx of sepsis from lower leg wounds. Has multiple vascular ulcers on lower legs and feet.  Shortness of Breath This is a new problem. The current episode started 1 to 4 weeks ago. The problem occurs constantly. Pertinent negatives include no ear pain or vomiting. His past medical history is significant for a heart failure.      Review of Systems  HENT:  Negative for ear pain.   Respiratory:  Positive for shortness of breath.   Gastrointestinal:  Negative for vomiting.  All other systems reviewed and are negative.      Objective:   Physical Exam Vitals reviewed.  Constitutional:      General: He is not in acute distress.    Appearance: He is well-developed. He is obese.  HENT:     Head: Normocephalic.  Eyes:     General:        Right eye: No discharge.        Left eye: No discharge.     Pupils: Pupils are equal, round, and reactive to light.  Neck:     Thyroid: No thyromegaly.  Cardiovascular:     Rate and Rhythm: Normal rate and regular rhythm.     Heart sounds: Murmur heard.  Pulmonary:     Effort: Pulmonary effort is normal. No respiratory distress.     Breath sounds: Normal breath sounds. No wheezing.  Abdominal:     General: Bowel sounds are normal. There is no distension.     Palpations: Abdomen is soft.     Tenderness: There is no abdominal tenderness.  Musculoskeletal:        General: No tenderness.  Normal range of motion.     Cervical back: Normal range of motion and neck supple.     Right lower leg: Edema (trace) present.     Left lower leg: Edema (trace) present.  Skin:    General: Skin is warm and dry.     Coloration: Skin is pale.     Findings: No erythema or rash.       Neurological:     Mental Status: He is alert and oriented to person, place, and time.     Cranial Nerves: No cranial nerve deficit.     Motor: Weakness present.     Gait: Gait abnormal.     Deep Tendon Reflexes: Reflexes are normal and symmetric.  Psychiatric:        Behavior: Behavior normal.        Thought Content: Thought content normal.        Judgment: Judgment normal.       BP (!) 101/55   Pulse 72   Temp 97.6 F (36.4 C) (Temporal)  Ht  (1.905 m)   SpO2 95%   BMI 34.75 kg/m      Assessment & Plan:  [name] [name] comes in today with chief complaint of fluid (Gained 22lbs since a couple of weeks could not get on scaled here ), Fatigue (Last couple of days.), and head and neck pain   Diagnosis and orders addressed:  1. Weight gain - CMP14+EGFR - CBC with Differential/Platelet - furosemide (LASIX) 40 MG tablet; Take 1 tablet (40 mg total) by mouth daily.  Dispense: 90 tablet; Refill: 1  2. Systolic congestive heart failure, unspecified HF chronicity - CBC with Differential/Platelet - furosemide (LASIX) 40 MG tablet; Take 1 tablet (40 mg total) by mouth daily.  Dispense: 90 tablet; Refill: 1  3. Generalized weakness - CBC with Differential/Platelet - furosemide (LASIX) 40 MG tablet; Take 1 tablet (40 mg total) by mouth daily.  Dispense: 90 tablet; Refill: 1  4. Medically noncompliant  - CBC with Differential/Platelet  5. PAOD (peripheral arterial occlusive disease) - CBC with Differential/Platelet  6. Pale - CBC with Differential/Platelet  7. Acute cough - CMP14+EGFR - CBC with Differential/Platelet - DG Chest 2 View  Advised patient to go to ED. Pt refuses and  states he is never going back to ED.  Will increase lasix to 40 mg from 20 mg  Strict low salt diet  Red flags discussed that his wife will have to take him to ED. Follow up with me on Monday.  Start doxycycline vascular wounds. Has hx of sepsis. Discussed in length the risks and that he should go to ED. Pt refuses.  Labs pending Approx 45 mins spent with patient, education, chart review, and medications  Follow up plan: Monday, but if symptoms worsen or do not improve over weekend go to ED  Jannifer Rodney, FNP

## 2022-09-10 NOTE — Telephone Encounter (Signed)
APPT MADE COMING TODAY

## 2022-09-11 ENCOUNTER — Inpatient Hospital Stay (HOSPITAL_COMMUNITY)
Admission: EM | Admit: 2022-09-11 | Discharge: 2022-09-13 | DRG: 291 | Disposition: A | Discharge status: Home / Self Care | Payer: Medicare Other | Attending: Family Medicine | Admitting: Family Medicine

## 2022-09-11 ENCOUNTER — Encounter (HOSPITAL_COMMUNITY): Payer: Self-pay | Admitting: *Deleted

## 2022-09-11 ENCOUNTER — Emergency Department (HOSPITAL_COMMUNITY): Payer: Medicare Other

## 2022-09-11 ENCOUNTER — Other Ambulatory Visit: Payer: Self-pay

## 2022-09-11 DIAGNOSIS — I5043 Acute on chronic combined systolic (congestive) and diastolic (congestive) heart failure: Secondary | ICD-10-CM | POA: Diagnosis present

## 2022-09-11 DIAGNOSIS — M79604 Pain in right leg: Secondary | ICD-10-CM | POA: Diagnosis present

## 2022-09-11 DIAGNOSIS — E1122 Type 2 diabetes mellitus with diabetic chronic kidney disease: Secondary | ICD-10-CM | POA: Diagnosis present

## 2022-09-11 DIAGNOSIS — R6510 Systemic inflammatory response syndrome (SIRS) of non-infectious origin without acute organ dysfunction: Secondary | ICD-10-CM | POA: Diagnosis not present

## 2022-09-11 DIAGNOSIS — E669 Obesity, unspecified: Secondary | ICD-10-CM | POA: Diagnosis present

## 2022-09-11 DIAGNOSIS — Z833 Family history of diabetes mellitus: Secondary | ICD-10-CM

## 2022-09-11 DIAGNOSIS — E11622 Type 2 diabetes mellitus with other skin ulcer: Secondary | ICD-10-CM | POA: Diagnosis not present

## 2022-09-11 DIAGNOSIS — I5023 Acute on chronic systolic (congestive) heart failure: Secondary | ICD-10-CM | POA: Diagnosis not present

## 2022-09-11 DIAGNOSIS — I252 Old myocardial infarction: Secondary | ICD-10-CM

## 2022-09-11 DIAGNOSIS — Z91048 Other nonmedicinal substance allergy status: Secondary | ICD-10-CM

## 2022-09-11 DIAGNOSIS — E119 Type 2 diabetes mellitus without complications: Secondary | ICD-10-CM

## 2022-09-11 DIAGNOSIS — Z91013 Allergy to seafood: Secondary | ICD-10-CM

## 2022-09-11 DIAGNOSIS — I5020 Unspecified systolic (congestive) heart failure: Secondary | ICD-10-CM

## 2022-09-11 DIAGNOSIS — M797 Fibromyalgia: Secondary | ICD-10-CM | POA: Diagnosis present

## 2022-09-11 DIAGNOSIS — F419 Anxiety disorder, unspecified: Secondary | ICD-10-CM | POA: Diagnosis present

## 2022-09-11 DIAGNOSIS — I152 Hypertension secondary to endocrine disorders: Secondary | ICD-10-CM | POA: Diagnosis not present

## 2022-09-11 DIAGNOSIS — I130 Hypertensive heart and chronic kidney disease with heart failure and stage 1 through stage 4 chronic kidney disease, or unspecified chronic kidney disease: Principal | ICD-10-CM | POA: Diagnosis present

## 2022-09-11 DIAGNOSIS — I110 Hypertensive heart disease with heart failure: Secondary | ICD-10-CM | POA: Diagnosis not present

## 2022-09-11 DIAGNOSIS — R531 Weakness: Secondary | ICD-10-CM | POA: Diagnosis present

## 2022-09-11 DIAGNOSIS — E1169 Type 2 diabetes mellitus with other specified complication: Secondary | ICD-10-CM | POA: Diagnosis not present

## 2022-09-11 DIAGNOSIS — Z818 Family history of other mental and behavioral disorders: Secondary | ICD-10-CM

## 2022-09-11 DIAGNOSIS — L97919 Non-pressure chronic ulcer of unspecified part of right lower leg with unspecified severity: Secondary | ICD-10-CM | POA: Diagnosis present

## 2022-09-11 DIAGNOSIS — Z91199 Patient's noncompliance with other medical treatment and regimen due to unspecified reason: Secondary | ICD-10-CM

## 2022-09-11 DIAGNOSIS — I5033 Acute on chronic diastolic (congestive) heart failure: Secondary | ICD-10-CM | POA: Diagnosis not present

## 2022-09-11 DIAGNOSIS — N289 Disorder of kidney and ureter, unspecified: Secondary | ICD-10-CM

## 2022-09-11 DIAGNOSIS — L97929 Non-pressure chronic ulcer of unspecified part of left lower leg with unspecified severity: Secondary | ICD-10-CM | POA: Diagnosis present

## 2022-09-11 DIAGNOSIS — S81809A Unspecified open wound, unspecified lower leg, initial encounter: Secondary | ICD-10-CM | POA: Diagnosis not present

## 2022-09-11 DIAGNOSIS — I2510 Atherosclerotic heart disease of native coronary artery without angina pectoris: Secondary | ICD-10-CM | POA: Diagnosis present

## 2022-09-11 DIAGNOSIS — Z87891 Personal history of nicotine dependence: Secondary | ICD-10-CM

## 2022-09-11 DIAGNOSIS — E785 Hyperlipidemia, unspecified: Secondary | ICD-10-CM | POA: Diagnosis not present

## 2022-09-11 DIAGNOSIS — Z794 Long term (current) use of insulin: Secondary | ICD-10-CM

## 2022-09-11 DIAGNOSIS — N183 Chronic kidney disease, stage 3 unspecified: Secondary | ICD-10-CM | POA: Diagnosis present

## 2022-09-11 DIAGNOSIS — K5900 Constipation, unspecified: Secondary | ICD-10-CM | POA: Diagnosis present

## 2022-09-11 DIAGNOSIS — Z79899 Other long term (current) drug therapy: Secondary | ICD-10-CM

## 2022-09-11 DIAGNOSIS — Z800 Family history of malignant neoplasm of digestive organs: Secondary | ICD-10-CM

## 2022-09-11 DIAGNOSIS — E1159 Type 2 diabetes mellitus with other circulatory complications: Secondary | ICD-10-CM | POA: Diagnosis not present

## 2022-09-11 DIAGNOSIS — R630 Anorexia: Secondary | ICD-10-CM | POA: Diagnosis present

## 2022-09-11 DIAGNOSIS — Z8619 Personal history of other infectious and parasitic diseases: Secondary | ICD-10-CM

## 2022-09-11 DIAGNOSIS — Z91011 Allergy to milk products: Secondary | ICD-10-CM

## 2022-09-11 DIAGNOSIS — R9431 Abnormal electrocardiogram [ECG] [EKG]: Secondary | ICD-10-CM | POA: Diagnosis not present

## 2022-09-11 DIAGNOSIS — Z83438 Family history of other disorder of lipoprotein metabolism and other lipidemia: Secondary | ICD-10-CM

## 2022-09-11 DIAGNOSIS — K5730 Diverticulosis of large intestine without perforation or abscess without bleeding: Secondary | ICD-10-CM | POA: Diagnosis present

## 2022-09-11 DIAGNOSIS — E1142 Type 2 diabetes mellitus with diabetic polyneuropathy: Secondary | ICD-10-CM | POA: Diagnosis present

## 2022-09-11 DIAGNOSIS — D72829 Elevated white blood cell count, unspecified: Secondary | ICD-10-CM | POA: Diagnosis present

## 2022-09-11 DIAGNOSIS — N179 Acute kidney failure, unspecified: Secondary | ICD-10-CM | POA: Diagnosis present

## 2022-09-11 DIAGNOSIS — I779 Disorder of arteries and arterioles, unspecified: Secondary | ICD-10-CM | POA: Diagnosis not present

## 2022-09-11 DIAGNOSIS — N189 Chronic kidney disease, unspecified: Secondary | ICD-10-CM | POA: Diagnosis not present

## 2022-09-11 DIAGNOSIS — I480 Paroxysmal atrial fibrillation: Secondary | ICD-10-CM | POA: Diagnosis present

## 2022-09-11 DIAGNOSIS — K219 Gastro-esophageal reflux disease without esophagitis: Secondary | ICD-10-CM | POA: Diagnosis not present

## 2022-09-11 DIAGNOSIS — Z8261 Family history of arthritis: Secondary | ICD-10-CM

## 2022-09-11 DIAGNOSIS — M1990 Unspecified osteoarthritis, unspecified site: Secondary | ICD-10-CM | POA: Diagnosis present

## 2022-09-11 DIAGNOSIS — S81801A Unspecified open wound, right lower leg, initial encounter: Secondary | ICD-10-CM | POA: Diagnosis not present

## 2022-09-11 DIAGNOSIS — Z8249 Family history of ischemic heart disease and other diseases of the circulatory system: Secondary | ICD-10-CM

## 2022-09-11 DIAGNOSIS — D509 Iron deficiency anemia, unspecified: Secondary | ICD-10-CM | POA: Diagnosis present

## 2022-09-11 DIAGNOSIS — Z955 Presence of coronary angioplasty implant and graft: Secondary | ICD-10-CM

## 2022-09-11 DIAGNOSIS — Z6836 Body mass index (BMI) 36.0-36.9, adult: Secondary | ICD-10-CM

## 2022-09-11 DIAGNOSIS — Z9109 Other allergy status, other than to drugs and biological substances: Secondary | ICD-10-CM

## 2022-09-11 DIAGNOSIS — Z7901 Long term (current) use of anticoagulants: Secondary | ICD-10-CM

## 2022-09-11 DIAGNOSIS — S81802A Unspecified open wound, left lower leg, initial encounter: Secondary | ICD-10-CM | POA: Diagnosis present

## 2022-09-11 DIAGNOSIS — Z820 Family history of epilepsy and other diseases of the nervous system: Secondary | ICD-10-CM

## 2022-09-11 DIAGNOSIS — Z7902 Long term (current) use of antithrombotics/antiplatelets: Secondary | ICD-10-CM

## 2022-09-11 DIAGNOSIS — Z801 Family history of malignant neoplasm of trachea, bronchus and lung: Secondary | ICD-10-CM

## 2022-09-11 DIAGNOSIS — E1151 Type 2 diabetes mellitus with diabetic peripheral angiopathy without gangrene: Secondary | ICD-10-CM | POA: Diagnosis present

## 2022-09-11 DIAGNOSIS — Z823 Family history of stroke: Secondary | ICD-10-CM

## 2022-09-11 DIAGNOSIS — Z888 Allergy status to other drugs, medicaments and biological substances status: Secondary | ICD-10-CM

## 2022-09-11 DIAGNOSIS — R0602 Shortness of breath: Secondary | ICD-10-CM | POA: Diagnosis not present

## 2022-09-11 DIAGNOSIS — R635 Abnormal weight gain: Secondary | ICD-10-CM

## 2022-09-11 DIAGNOSIS — Z7985 Long-term (current) use of injectable non-insulin antidiabetic drugs: Secondary | ICD-10-CM

## 2022-09-11 LAB — BASIC METABOLIC PANEL
Anion gap: 13 (ref 5–15)
BUN: 41 mg/dL — ABNORMAL HIGH (ref 8–23)
CO2: 27 mmol/L (ref 22–32)
Calcium: 9.6 mg/dL (ref 8.9–10.3)
Chloride: 89 mmol/L — ABNORMAL LOW (ref 98–111)
Creatinine, Ser: 1.89 mg/dL — ABNORMAL HIGH (ref 0.61–1.24)
GFR, Estimated: 37 mL/min — ABNORMAL LOW (ref >60)
Glucose, Bld: 128 mg/dL — ABNORMAL HIGH (ref 70–99)
Potassium: 4 mmol/L (ref 3.5–5.1)
Sodium: 129 mmol/L — ABNORMAL LOW (ref 135–145)

## 2022-09-11 LAB — CMP14+EGFR
ALT: 32 IU/L (ref 0–44)
AST: 42 IU/L — ABNORMAL HIGH (ref 0–40)
Albumin/Globulin Ratio: 1.1 — ABNORMAL LOW (ref 1.2–2.2)
Albumin: 4 g/dL (ref 3.8–4.8)
Alkaline Phosphatase: 98 IU/L (ref 44–121)
BUN/Creatinine Ratio: 19 (ref 10–24)
BUN: 36 mg/dL — ABNORMAL HIGH (ref 8–27)
Bilirubin Total: 0.9 mg/dL (ref 0.0–1.2)
CO2: 24 mmol/L (ref 20–29)
Calcium: 9.6 mg/dL (ref 8.6–10.2)
Chloride: 92 mmol/L — ABNORMAL LOW (ref 96–106)
Creatinine, Ser: 1.91 mg/dL — ABNORMAL HIGH (ref 0.76–1.27)
Globulin, Total: 3.5 g/dL (ref 1.5–4.5)
Glucose: 133 mg/dL — ABNORMAL HIGH (ref 70–99)
Potassium: 4.6 mmol/L (ref 3.5–5.2)
Sodium: 135 mmol/L (ref 134–144)
Total Protein: 7.5 g/dL (ref 6.0–8.5)
eGFR: 37 mL/min/{1.73_m2} — ABNORMAL LOW (ref >59)

## 2022-09-11 LAB — URINALYSIS, ROUTINE W REFLEX MICROSCOPIC
Bilirubin Urine: NEGATIVE
Glucose, UA: NEGATIVE mg/dL
Hgb urine dipstick: NEGATIVE
Ketones, ur: NEGATIVE mg/dL
Leukocytes,Ua: NEGATIVE
Nitrite: NEGATIVE
Protein, ur: NEGATIVE mg/dL
Specific Gravity, Urine: 1.008 (ref 1.005–1.030)
pH: 6 (ref 5.0–8.0)

## 2022-09-11 LAB — CBC
HCT: 38.4 % — ABNORMAL LOW (ref 39.0–52.0)
Hemoglobin: 11.7 g/dL — ABNORMAL LOW (ref 13.0–17.0)
MCH: 26.7 pg (ref 26.0–34.0)
MCHC: 30.5 g/dL (ref 30.0–36.0)
MCV: 87.7 fL (ref 80.0–100.0)
Platelets: 463 10*3/uL — ABNORMAL HIGH (ref 150–400)
RBC: 4.38 MIL/uL (ref 4.22–5.81)
RDW: 15.6 % — ABNORMAL HIGH (ref 11.5–15.5)
WBC: 14 10*3/uL — ABNORMAL HIGH (ref 4.0–10.5)
nRBC: 0 % (ref 0.0–0.2)

## 2022-09-11 LAB — TROPONIN I (HIGH SENSITIVITY): Troponin I (High Sensitivity): 29 ng/L — ABNORMAL HIGH (ref <18)

## 2022-09-11 LAB — CBC WITH DIFFERENTIAL/PLATELET
Basophils Absolute: 0.1 10*3/uL (ref 0.0–0.2)
Basos: 1 %
EOS (ABSOLUTE): 0.2 10*3/uL (ref 0.0–0.4)
Eos: 1 %
Hematocrit: 33.9 % — ABNORMAL LOW (ref 37.5–51.0)
Hemoglobin: 10.8 g/dL — ABNORMAL LOW (ref 13.0–17.7)
Immature Grans (Abs): 0.1 10*3/uL (ref 0.0–0.1)
Immature Granulocytes: 1 %
Lymphocytes Absolute: 1.4 10*3/uL (ref 0.7–3.1)
Lymphs: 12 %
MCH: 26.8 pg (ref 26.6–33.0)
MCHC: 31.9 g/dL (ref 31.5–35.7)
MCV: 84 fL (ref 79–97)
Monocytes Absolute: 1.3 10*3/uL — ABNORMAL HIGH (ref 0.1–0.9)
Monocytes: 11 %
Neutrophils Absolute: 9.2 10*3/uL — ABNORMAL HIGH (ref 1.4–7.0)
Neutrophils: 74 %
Platelets: 369 10*3/uL (ref 150–450)
RBC: 4.03 x10E6/uL — ABNORMAL LOW (ref 4.14–5.80)
RDW: 14.6 % (ref 11.6–15.4)
WBC: 12.3 10*3/uL — ABNORMAL HIGH (ref 3.4–10.8)

## 2022-09-11 LAB — PROCALCITONIN: Procalcitonin: 0.33 ng/mL

## 2022-09-11 LAB — SEDIMENTATION RATE: Sed Rate: 90 mm/hr — ABNORMAL HIGH (ref 0–16)

## 2022-09-11 LAB — LACTIC ACID, PLASMA
Lactic Acid, Venous: 1.9 mmol/L (ref 0.5–1.9)
Lactic Acid, Venous: 1.4 mmol/L (ref 0.5–1.9)

## 2022-09-11 LAB — CULTURE, BLOOD (ROUTINE X 2): Special Requests: ADEQUATE

## 2022-09-11 LAB — CBG MONITORING, ED: Glucose-Capillary: 127 mg/dL — ABNORMAL HIGH (ref 70–99)

## 2022-09-11 LAB — BRAIN NATRIURETIC PEPTIDE: B Natriuretic Peptide: 180 pg/mL — ABNORMAL HIGH (ref 0.0–100.0)

## 2022-09-11 MED ORDER — SODIUM CHLORIDE 0.9% FLUSH: 3.0000 mL | INTRAVENOUS | Status: DC | PRN

## 2022-09-11 MED ORDER — METOPROLOL SUCCINATE ER 50 MG PO TB24
50.0000 mg | ORAL_TABLET | Freq: Every day | ORAL | Status: DC
  Administered 2022-09-12 – 2022-09-13 (×2): 50 mg via ORAL
  Filled 2022-09-11 (×2): qty 1

## 2022-09-11 MED ORDER — METOLAZONE 5 MG PO TABS
5.0000 mg | ORAL_TABLET | Freq: Every day | ORAL | Status: DC
  Administered 2022-09-12 – 2022-09-13 (×2): 5 mg via ORAL
  Filled 2022-09-11: qty 2
  Filled 2022-09-11: qty 1

## 2022-09-11 MED ORDER — SODIUM CHLORIDE 0.9 % IV SOLN: 250.0000 mL | INTRAVENOUS | Status: DC | PRN

## 2022-09-11 MED ORDER — INSULIN ASPART 100 UNIT/ML IJ SOLN
4.0000 [IU] | Freq: Three times a day (TID) | INTRAMUSCULAR | Status: DC
  Administered 2022-09-12 – 2022-09-13 (×4): 4 [IU] via SUBCUTANEOUS
  Filled 2022-09-11 (×2): qty 1

## 2022-09-11 MED ORDER — INSULIN GLARGINE-YFGN 100 UNIT/ML ~~LOC~~ SOLN
10.0000 [IU] | Freq: Every day | SUBCUTANEOUS | Status: DC
  Administered 2022-09-11 – 2022-09-12 (×2): 10 [IU] via SUBCUTANEOUS
  Filled 2022-09-11 (×3): qty 0.1

## 2022-09-11 MED ORDER — SODIUM CHLORIDE 0.9 % IV SOLN
2.0000 g | INTRAVENOUS | Status: DC
  Administered 2022-09-11 – 2022-09-12 (×2): 2 g via INTRAVENOUS
  Filled 2022-09-11 (×2): qty 20

## 2022-09-11 MED ORDER — ONDANSETRON HCL 4 MG PO TABS: 4.0000 mg | ORAL_TABLET | Freq: Four times a day (QID) | ORAL | Status: DC | PRN

## 2022-09-11 MED ORDER — FUROSEMIDE 10 MG/ML IJ SOLN
40.0000 mg | Freq: Every day | INTRAMUSCULAR | Status: DC
  Administered 2022-09-12 – 2022-09-13 (×2): 40 mg via INTRAVENOUS
  Filled 2022-09-11 (×2): qty 4

## 2022-09-11 MED ORDER — FUROSEMIDE 10 MG/ML IJ SOLN
60.0000 mg | Freq: Once | INTRAMUSCULAR | Status: AC
  Administered 2022-09-11: 60 mg via INTRAVENOUS
  Filled 2022-09-11: qty 6

## 2022-09-11 MED ORDER — INSULIN ASPART 100 UNIT/ML IJ SOLN
0.0000 [IU] | Freq: Every day | INTRAMUSCULAR | Status: DC
  Administered 2022-09-12: 3 [IU] via SUBCUTANEOUS

## 2022-09-11 MED ORDER — OXYCODONE-ACETAMINOPHEN 5-325 MG PO TABS
1.0000 | ORAL_TABLET | Freq: Once | ORAL | Status: AC
  Administered 2022-09-11: 1 via ORAL
  Filled 2022-09-11: qty 1

## 2022-09-11 MED ORDER — PANTOPRAZOLE SODIUM 40 MG PO TBEC
40.0000 mg | DELAYED_RELEASE_TABLET | Freq: Two times a day (BID) | ORAL | Status: DC
  Administered 2022-09-11 – 2022-09-13 (×4): 40 mg via ORAL
  Filled 2022-09-11 (×4): qty 1

## 2022-09-11 MED ORDER — ACETAMINOPHEN 325 MG PO TABS
650.0000 mg | ORAL_TABLET | Freq: Four times a day (QID) | ORAL | Status: DC | PRN
  Administered 2022-09-12: 650 mg via ORAL
  Filled 2022-09-11: qty 2

## 2022-09-11 MED ORDER — EZETIMIBE 10 MG PO TABS
10.0000 mg | ORAL_TABLET | Freq: Every day | ORAL | Status: DC
  Administered 2022-09-11 – 2022-09-13 (×3): 10 mg via ORAL
  Filled 2022-09-11 (×3): qty 1

## 2022-09-11 MED ORDER — HYDROCODONE-ACETAMINOPHEN 10-325 MG PO TABS
1.0000 | ORAL_TABLET | Freq: Four times a day (QID) | ORAL | Status: DC | PRN
  Administered 2022-09-11 – 2022-09-13 (×4): 1 via ORAL
  Filled 2022-09-11 (×4): qty 1

## 2022-09-11 MED ORDER — ISOSORBIDE MONONITRATE ER 60 MG PO TB24
30.0000 mg | ORAL_TABLET | Freq: Every day | ORAL | Status: DC
  Administered 2022-09-11 – 2022-09-13 (×3): 30 mg via ORAL
  Filled 2022-09-11 (×3): qty 1

## 2022-09-11 MED ORDER — INSULIN ASPART 100 UNIT/ML IJ SOLN
0.0000 [IU] | Freq: Three times a day (TID) | INTRAMUSCULAR | Status: DC
  Administered 2022-09-12 – 2022-09-13 (×4): 3 [IU] via SUBCUTANEOUS
  Filled 2022-09-11 (×2): qty 1

## 2022-09-11 MED ORDER — ACETAMINOPHEN 650 MG RE SUPP: 650.0000 mg | Freq: Four times a day (QID) | RECTAL | Status: DC | PRN

## 2022-09-11 MED ORDER — ONDANSETRON HCL 4 MG/2ML IJ SOLN
4.0000 mg | Freq: Four times a day (QID) | INTRAMUSCULAR | Status: DC | PRN
  Administered 2022-09-13: 4 mg via INTRAVENOUS
  Filled 2022-09-11: qty 2

## 2022-09-11 MED ORDER — FENOFIBRATE 160 MG PO TABS
160.0000 mg | ORAL_TABLET | Freq: Every day | ORAL | Status: DC
  Administered 2022-09-11 – 2022-09-13 (×3): 160 mg via ORAL
  Filled 2022-09-11 (×3): qty 1

## 2022-09-11 MED ORDER — APIXABAN 5 MG PO TABS
5.0000 mg | ORAL_TABLET | Freq: Two times a day (BID) | ORAL | Status: DC
  Administered 2022-09-11 – 2022-09-13 (×4): 5 mg via ORAL
  Filled 2022-09-11 (×4): qty 1

## 2022-09-11 MED ORDER — FUROSEMIDE 10 MG/ML IJ SOLN
INTRAMUSCULAR | Status: AC
  Filled 2022-09-11: qty 4

## 2022-09-11 MED ORDER — POLYETHYLENE GLYCOL 3350 17 G PO PACK: 17.0000 g | PACK | Freq: Every day | ORAL | Status: DC | PRN

## 2022-09-11 MED ORDER — SODIUM CHLORIDE 0.9% FLUSH
3.0000 mL | Freq: Two times a day (BID) | INTRAVENOUS | Status: DC
  Administered 2022-09-11 – 2022-09-13 (×4): 3 mL via INTRAVENOUS

## 2022-09-11 MED ORDER — DULOXETINE HCL 60 MG PO CPEP
60.0000 mg | ORAL_CAPSULE | Freq: Every day | ORAL | Status: DC
  Administered 2022-09-11 – 2022-09-13 (×3): 60 mg via ORAL
  Filled 2022-09-11 (×2): qty 2
  Filled 2022-09-11: qty 1

## 2022-09-11 MED ORDER — METRONIDAZOLE 500 MG/100ML IV SOLN
500.0000 mg | Freq: Three times a day (TID) | INTRAVENOUS | Status: DC
  Administered 2022-09-11 – 2022-09-13 (×5): 500 mg via INTRAVENOUS
  Filled 2022-09-11 (×5): qty 100

## 2022-09-11 MED ORDER — FERROUS SULFATE 325 (65 FE) MG PO TABS
325.0000 mg | ORAL_TABLET | Freq: Every morning | ORAL | Status: DC
  Administered 2022-09-12 – 2022-09-13 (×2): 325 mg via ORAL
  Filled 2022-09-11 (×2): qty 1

## 2022-09-11 MED ORDER — CLOPIDOGREL BISULFATE 75 MG PO TABS
75.0000 mg | ORAL_TABLET | Freq: Every day | ORAL | Status: DC
  Administered 2022-09-12 – 2022-09-13 (×2): 75 mg via ORAL
  Filled 2022-09-11 (×2): qty 1

## 2022-09-11 MED ORDER — DILTIAZEM HCL 25 MG/5ML IV SOLN
10.0000 mg | Freq: Once | INTRAVENOUS | Status: AC
  Administered 2022-09-11: 10 mg via INTRAVENOUS
  Filled 2022-09-11: qty 5

## 2022-09-11 MED ORDER — GABAPENTIN 300 MG PO CAPS
900.0000 mg | ORAL_CAPSULE | Freq: Two times a day (BID) | ORAL | Status: DC
  Administered 2022-09-11 – 2022-09-13 (×4): 900 mg via ORAL
  Filled 2022-09-11 (×4): qty 3

## 2022-09-11 NOTE — Assessment & Plan Note (Signed)
Patient presented with 20 pound weight gain, worsening lower extremity edema, abdominal wall edema, worsening exertional dyspnea, orthopnea and PND.  Recently increase Lasix by primary care provider.  BNP is mildly elevated at 180. Most recent echo done in August 2023 with a EF of 40 to 45%. -Admit to telemetry -Lasix 40 mg daily -Continue home metolazone -Daily BMP and weight -Strict intake and output -Echocardiogram

## 2022-09-11 NOTE — Assessment & Plan Note (Signed)
Continue home gabapentin ?

## 2022-09-11 NOTE — Assessment & Plan Note (Signed)
-   Continue home amlodipine and Imdur 

## 2022-09-11 NOTE — Assessment & Plan Note (Signed)
Patient seems like on basal, NovoLog and 70/30 at home which need to be addressed on discharge. -10 units of Semglee -Moderate SSI -4 units with meal

## 2022-09-11 NOTE — Assessment & Plan Note (Signed)
Patient has an history of paroxysmal atrial fibrillation, found to be in A-fib with RVR. -Giving IV Cardizem-2 doses of 10 mg ordered -Continue home metoprolol -Continue home Eliquis

## 2022-09-11 NOTE — Assessment & Plan Note (Signed)
Estimated body mass index is 36.75 kg/m as calculated from the following:   Height as of this encounter: 6\' 3"  (1.905 m).   Weight as of this encounter: 133.4 kg.   -This will complicate overall prognosis

## 2022-09-11 NOTE — Assessment & Plan Note (Signed)
Hemoglobin seems stable. -Continue home iron supplement 

## 2022-09-11 NOTE — Assessment & Plan Note (Signed)
Patient has an history of chronic lower extremity wound.  Patient has home health nurses coming for wound care.  Right lower leg wound with some increased in weeping, left plantar wound just beneath greater and second toe with some serosanguineous discharge and surrounding erythema.  Slight worsening of pain in right lower extremity. -Ceftriaxone and Flagyl -Wound care

## 2022-09-11 NOTE — Assessment & Plan Note (Signed)
Seems like having CKD stage III, worsening creatinine, currently at 1.89 with baseline seems to be around 1.3-1.5. -Monitor renal function while he is being diuresed -Avoid nephrotoxins

## 2022-09-11 NOTE — ED Notes (Signed)
Hospitalist at bedside 

## 2022-09-11 NOTE — Assessment & Plan Note (Signed)
Patient has an history of statin allergy. -Continue home Zetia and fenofibrate

## 2022-09-11 NOTE — H&P (Signed)
History and Physical    Patient: [name] [name] UJW:[id number] DOB: [birth year] DOA: 09/11/2022 DOS: the patient was seen and examined on 09/11/2022 PCP: Junie Spencer, FNP  Patient coming from: Home  Chief Complaint: Shortness of breath Chief Complaint  Patient presents with   Weakness   HPI: [name] [name] is a 72 y.o. male with medical history significant of HFrEF, chronic lower extremity wound, PAD, CAD, diabetes mellitus and hypertension presented to ED with complaint of worsening shortness of breath, orthopnea and PND for the past couple of weeks.  Per patient he has gained 20 pounds of weight, with worsening shortness of breath.  Orthopnea and PND seems chronic as he most of the time sleeping in recliner.  He saw his PCP for similar reason and Lasix dose was increased from 20 mg daily to 40 mg daily 2 days ago.  He was called by PCP due to his labs showing AKI and advised to come to ED for further evaluation.  Patient was also experiencing worsening weakness and loss of appetite for the past 2 to 3 days.  No fever or chills.  No other recent illnesses or upper respiratory symptoms.  Denies any chest pain.  No urinary symptoms.  Patient was having some constipation and abdominal pain, 1 week ago and for which he increased his dose of stool softener.  Had 4 bowel movement since morning which he attributes to the use of laxative.  No diarrhea.  Abdominal pain has been improved since then.  He was complaining of worsening right lower extremity pain surrounding his chronic wound.  Also noticed more discharge from left plantar surface wound.  He was placed on doxycycline by his PCP 2 days ago.  He has a home health nurse coming home for wound care.  ED Course. Vital with HR 110, BP 138/98, afebrile, WBC 14.0, Hgb 11.7, BMP with Na 129,Cl 89, BUN 41, Creatinine 1.89, (Baseline 1.3-1.5), trop 29.BNP 180. CXR. With pulmonary vascular congestion. EKG: Personally reviewed , A fib with HR  104.  Received IV lasix 60 mg in ED.  Review of Systems: As mentioned in the history of present illness. All other systems reviewed and are negative. Past Medical History:  Diagnosis Date   Anxiety    Arthritis    Atrial fibrillation (HCC)    CAD (coronary artery disease)    a. 2010: DES to CTO of RCA. EF 55% b. 07/2016: cath showing total occlusion within previously placed RCA stent (collaterals present), severe stenosis along LCx and OM1 (treated with 2 overlapping DES). c. repeat cath in 01/2018 showing patent stents along LCx and OM with CTO of D2, CTO of distal LCx, and CTO of RCA with collaterals present overall unchanged since 2018 with medical management recom   Cellulitis and abscess rt groin    Complication of anesthesia    " I woke up during a colonoscopy "      Depression    Diabetes mellitus    Diastolic CHF (HCC)    Disorders of iron metabolism    Dysrhythmia    Fibromyalgia    GERD (gastroesophageal reflux disease)    History of hiatal hernia    Hyperlipidemia    Hypertension    Low serum testosterone level    Medically noncompliant    Myocardial infarction (HCC)    05-23-20   Pneumonia    Sepsis (HCC)    2023   Past Surgical History:  Procedure Laterality Date   ABDOMINAL AORTOGRAM W/LOWER  EXTREMITY N/A 03/21/2019   Procedure: ABDOMINAL AORTOGRAM W/LOWER EXTREMITY;  Surgeon: Nada Libman, MD;  Location: [facility];  Service: Cardiovascular;  Laterality: N/A;   ABDOMINAL AORTOGRAM W/LOWER EXTREMITY N/A 11/12/2020   Procedure: ABDOMINAL AORTOGRAM W/LOWER EXTREMITY;  Surgeon: Nada Libman, MD;  Location: [facility];  Service: Cardiovascular;  Laterality: N/A;   ABDOMINAL AORTOGRAM W/LOWER EXTREMITY N/A 01/28/2021   Procedure: ABDOMINAL AORTOGRAM W/LOWER EXTREMITY;  Surgeon: Nada Libman, MD;  Location: [facility];  Service: Cardiovascular;  Laterality: N/A;   ABDOMINAL AORTOGRAM W/LOWER EXTREMITY Bilateral 07/29/2021   Procedure:  ABDOMINAL AORTOGRAM W/LOWER EXTREMITY;  Surgeon: Nada Libman, MD;  Location: [facility];  Service: Cardiovascular;  Laterality: Bilateral;   ANGIOPLASTY N/A 08/15/2021   Procedure: ATTEMPTED RIGHT PERONEAL  ANGIOPLASTY, LEFT PERONEAL ANGIOPLASTY;  Surgeon: Nada Libman, MD;  Location: [facility];  Service: Vascular;  Laterality: N/A;   BACK SURGERY  2015   ACDF by Dr. Charm Rings STUDY  12/23/2021   Procedure: BUBBLE STUDY;  Surgeon: Parke Poisson, MD;  Location: [facility] ENDOSCOPY;  Service: Cardiology;;   COLONOSCOPY N/A 10/01/2014   Dr. Jena Gauss: multiple tubular adenomas removed, colonic diverticulosis, redundant colon. next tcs advised for 09/2017. PATIENT NEEDS PROPOFOL FOR FAILED CONSCIOUS SEDATION   CORONARY STENT INTERVENTION N/A 07/30/2016   Procedure: Coronary Stent Intervention;  Surgeon: Tonny Bollman, MD;  Location: [facility] INVASIVE CV LAB;  Service: Cardiovascular;  Laterality: N/A;   CORONARY STENT PLACEMENT  2000   By Dr. Juanda Chance   EP IMPLANTABLE DEVICE N/A 05/25/2016   Procedure: Loop Recorder Insertion;  Surgeon: Marinus Maw, MD;  Location: [facility] INVASIVE CV LAB;  Service: Cardiovascular;  Laterality: N/A;   ESOPHAGOGASTRODUODENOSCOPY     esophagus stretched [facility] at [facility]   ESOPHAGOGASTRODUODENOSCOPY N/A 10/01/2014   Dr. Jena Gauss: patchy mottling/erythema and minimal polypoid appearance of gastric mucosa. bx with mild inlammation but no H.pylori   FEMORAL-POPLITEAL BYPASS GRAFT Left 11/22/2020   Procedure: LEFT FEMORAL-POPLITEAL BYPASS GRAFT;  Surgeon: Nada Libman, MD;  Location: [facility];  Service: Vascular;  Laterality: Left;   FEMORAL-POPLITEAL BYPASS GRAFT Left 08/15/2021   Procedure: REDO LEFT FEMORAL-POPLITEAL BYPASS USING PROPATEN GRAFT;  Surgeon: Nada Libman, MD;  Location: [facility];  Service: Vascular;  Laterality: Left;   HERNIA REPAIR  2000   umbilical   INSERTION OF ILIAC STENT Right 11/22/2020   Procedure: INSERTION OF ELUVIA STENT INTO RIGHT DISTAL SUPERFICIAL  FEMORAL ARTERY;  Surgeon: Nada Libman, MD;  Location: [facility];  Service: Vascular;  Laterality: Right;   LEFT HEART CATH AND CORONARY ANGIOGRAPHY N/A 07/30/2016   Procedure: Left Heart Cath and Coronary Angiography;  Surgeon: Tonny Bollman, MD;  Location: [facility] INVASIVE CV LAB;  Service: Cardiovascular;  Laterality: N/A;   LEFT HEART CATH AND CORONARY ANGIOGRAPHY N/A 01/19/2018   Procedure: LEFT HEART CATH AND CORONARY ANGIOGRAPHY;  Surgeon: Lennette Bihari, MD;  [facility];  Service: Cardiovascular;  Laterality: N/A;   LEFT HEART CATH AND CORONARY ANGIOGRAPHY N/A 05/24/2020   Procedure: LEFT HEART CATH AND CORONARY ANGIOGRAPHY;  Surgeon: Kathleene Hazel, MD;  [facility];  Service: Cardiovascular;  Laterality: N/A;   LEFT HEART CATH AND CORONARY ANGIOGRAPHY N/A 08/06/2021   Procedure: LEFT HEART CATH AND CORONARY ANGIOGRAPHY;  Surgeon: Kathleene Hazel, MD;  [facility]: [facility];  Service: Cardiovascular;  Laterality: N/A;   LESION REMOVAL  Lip and hand    LOWER EXTREMITY ANGIOGRAM Right 11/22/2020   Procedure: RIGHT LEG ANGIOGRAM;  Surgeon: Nada Libman, MD;  Location: [facility] OR;  Service: Vascular;  Laterality: Right;   LOWER EXTREMITY ANGIOGRAM Right 08/15/2021   Procedure: RIGHT LOWER EXTREMITY ANGIOGRAM;  Surgeon: Nada Libman, MD;  Location: [facility];  Service: Vascular;  Laterality: Right;   LOWER EXTREMITY ANGIOGRAPHY N/A 04/18/2019   Procedure: LOWER EXTREMITY ANGIOGRAPHY;  Surgeon: Nada Libman, MD;  [facility];  Service: Cardiovascular;  Laterality: N/A;   LOWER EXTREMITY ANGIOGRAPHY N/A 08/18/2021   Procedure: Lower Extremity Angiography;  Surgeon: Maeola Harman, MD;  Location: [facility] INVASIVE CV LAB;  Service: Cardiovascular;  Laterality: N/A;   NECK SURGERY     PERIPHERAL VASCULAR BALLOON ANGIOPLASTY  04/18/2019   Procedure: PERIPHERAL VASCULAR BALLOON ANGIOPLASTY;  Surgeon: Nada Libman, MD;   [facility];  Service: Cardiovascular;;   PERIPHERAL VASCULAR BALLOON ANGIOPLASTY Left 11/12/2020   Procedure: PERIPHERAL VASCULAR BALLOON ANGIOPLASTY;  Surgeon: Nada Libman, MD;  [facility]: [facility];  Service: Cardiovascular;  Laterality: Left;  Failed PTA of superficial femoral artery.   PERIPHERAL VASCULAR BALLOON ANGIOPLASTY Right 08/18/2021   Procedure: PERIPHERAL VASCULAR BALLOON ANGIOPLASTY;  Surgeon: Maeola Harman, MD;  Location: [facility] INVASIVE CV LAB;  Service: Cardiovascular;  Laterality: Right;  peroneal   PERIPHERAL VASCULAR INTERVENTION Right 03/21/2019   Procedure: PERIPHERAL VASCULAR INTERVENTION;  Surgeon: Nada Libman, MD;  Location: [facility];  Service: Cardiovascular;  Laterality: Right;  superficial femoral   PERIPHERAL VASCULAR INTERVENTION Right 08/18/2021   Procedure: PERIPHERAL VASCULAR INTERVENTION;  Surgeon: Maeola Harman, MD;  Location: [facility] INVASIVE CV LAB;  Service: Cardiovascular;  Laterality: Right;  tibial peroneal trunk   TEE WITHOUT CARDIOVERSION N/A 12/23/2021   Procedure: TRANSESOPHAGEAL ECHOCARDIOGRAM (TEE);  Surgeon: Parke Poisson, MD;  Location: [facility] ENDOSCOPY;  Service: Cardiology;  Laterality: N/A;   Social History:  reports that he quit smoking about 6 years ago. His smoking use included cigarettes. He has a 12.75 pack-year smoking history. He has never used smokeless tobacco. He reports that he does not drink alcohol and does not use drugs.  Allergies  Allergen Reactions   Shellfish Allergy Anaphylaxis, Hives and Swelling    Tongue swelling    Sulfa Antibiotics Anaphylaxis, Hives and Swelling    Tongue swelling   Ace Inhibitors Other (See Comments) and Cough    CKD, renal failure    Invokana [Canagliflozin] Other (See Comments)    Syncope Dehydration    Lexapro [Escitalopram] Other (See Comments)    Buzzing in ears Headaches "Felt like a zombie"   Metformin And Related Itching    Pravachol [Pravastatin Sodium] Other (See Comments)    Myalgias    Repatha [Evolocumab] Other (See Comments)    Myalgias Flu like symptoms    Tricor [Fenofibrate] Other (See Comments)    Myalgias     Zestril [Lisinopril] Cough   Farxiga [Dapagliflozin] Other (See Comments)    Nephro d/c'd   Crestor [Rosuvastatin] Other (See Comments)    Myalgias    Horse-Derived Products Rash    horse serum   Lipitor [Atorvastatin] Other (See Comments)    Myalgias    Livalo [Pitavastatin] Other (See Comments)    Myalgias    Milk (Cow) Diarrhea and Nausea Only    Stomach upset    Tape Rash    Family History  Problem Relation Age of Onset  Diabetes Father    Valvular heart disease Father    Arthritis Father    Heart disease Father    Stroke Father    Alzheimer's disease Mother    Hyperlipidemia Mother    Hypertension Mother    Arthritis Mother    Lung cancer Mother    Stroke Mother    Headache Mother    Arthritis/Rheumatoid Sister    Diabetes Sister    Hypertension Sister    Hyperlipidemia Sister    Depression Sister    Dementia Maternal Aunt    Dementia Maternal Uncle    Heart disease Maternal Uncle    Stomach cancer Paternal Uncle    Colon cancer Neg Hx    Liver disease Neg Hx     Prior to Admission medications   Medication Sig Start Date End Date Taking? Authorizing Provider  albuterol (VENTOLIN HFA) 108 (90 Base) MCG/ACT inhaler Inhale 2 puffs into the lungs every 6 (six) hours as needed for wheezing or shortness of breath. 11/04/21  Yes Gottschalk, Ashly M, DO  apixaban (ELIQUIS) 5 MG TABS tablet TAKE ONE (1) TABLET BY MOUTH TWICE DAILY Patient taking differently: Take 5 mg by mouth 2 (two) times daily. 09/04/22  Yes Hawks, Christy A, FNP  Calcium Carbonate Antacid (ALKA-SELTZER ANTACID PO) Take 1 tablet by mouth 2 (two) times daily as needed (heartburn, indigestion).   Yes [provider]  clopidogrel (PLAVIX) 75 MG tablet TAKE 1 TABLET BY MOUTH DAILY 11/06/21   Yes Chuck Hint, MD  doxycycline (VIBRA-TABS) 100 MG tablet Take 1 tablet (100 mg total) by mouth 2 (two) times daily. 09/10/22  Yes Hawks, Christy A, FNP  DULoxetine (CYMBALTA) 60 MG capsule Take 1 capsule (60 mg total) by mouth daily. 08/04/22  Yes Hawks, Neysa Bonito A, FNP  ezetimibe (ZETIA) 10 MG tablet TAKE 1 TABLET BY MOUTH DAILY 04/02/22  Yes Wendall Stade, MD  fenofibrate 160 MG tablet TAKE 1 TABLET BY MOUTH DAILY 07/02/22  Yes Hawks, Christy A, FNP  FEROSUL 325 (65 Fe) MG tablet Take 325 mg by mouth every morning. 05/26/22  Yes [provider]  furosemide (LASIX) 40 MG tablet Take 1 tablet (40 mg total) by mouth daily. 09/10/22  Yes Hawks, Christy A, FNP  gabapentin (NEURONTIN) 300 MG capsule Take 3 capsules (900 mg total) by mouth 2 (two) times daily. 08/04/22  Yes Hawks, Christy A, FNP  HYDROcodone-acetaminophen (NORCO) 10-325 MG tablet Take 1 tablet by mouth every 6 (six) hours as needed for moderate pain. 08/11/22  Yes Hawks, Christy A, FNP  insulin degludec (TRESIBA FLEXTOUCH) 200 UNIT/ML FlexTouch Pen Inject 20 Units into the skin at bedtime. 06/18/22  Yes Hawks, Christy A, FNP  insulin lispro (HUMALOG) 100 UNIT/ML KwikPen INJECT 15-20 UNITS SUBCUTANEOUSLY THREE TIMES A DAY WITH MEALS Patient taking differently: Inject 15 Units into the skin 3 (three) times daily before meals. 12/03/21  Yes Hawks, Neysa Bonito A, FNP  insulin NPH-regular Human (HUMULIN 70/30) (70-30) 100 UNIT/ML injection INJECT 60 UNITS SUBCUTANEOUSLY TWICE DAILY WITH MEAL Patient taking differently: 60-70 Units 2 (two) times daily with a meal. 06/23/22  Yes Hawks, Christy A, FNP  isosorbide mononitrate (IMDUR) 30 MG 24 hr tablet TAKE 1 TABLET BY MOUTH DAILY 12/03/21  Yes Hawks, Christy A, FNP  Menthol, Topical Analgesic, (BLUE-EMU MAXIMUM STRENGTH EX) Apply 1 application  topically daily as needed (pain).   Yes [provider]  metolazone (ZAROXOLYN) 5 MG tablet Take 5 mg by mouth daily.   Yes [provider]  metoprolol succinate (TOPROL-XL) 50 MG 24 hr tablet TAKE 1 TABLET BY MOUTH DAILY 07/31/22  Yes Marinus Maw, MD  metoprolol tartrate (LOPRESSOR) 50 MG tablet Take 50 mg by mouth daily as needed (atrial fibrillation).   Yes [provider]  nitroGLYCERIN (NITROSTAT) 0.4 MG SL tablet DISSOLVE ONE TABLET UNDER THE TONGUE EVERY 5 MINUTES AS NEEDED FOR CHEST PAIN.  DO NOT EXCEED A TOTAL OF 3 DOSES IN 15 MINUTES Strength: 0.4 mg 04/28/21  Yes Wendall Stade, MD  pantoprazole (PROTONIX) 40 MG tablet Take 1 tablet (40 mg total) by mouth 2 (two) times daily. Patient taking differently: Take 40 mg by mouth daily. 01/26/22  Yes Tyrone Nine, MD  tirzepatide [facility]) 10 MG/0.5ML Pen Inject 10 mg into the skin once a week. 03/16/22  Yes Hawks, Christy A, FNP  baclofen (LIORESAL) 10 MG tablet Take 0.5-1 tablets (5-10 mg total) by mouth 3 (three) times daily. Patient not taking: Reported on 09/11/2022 07/02/22   Junie Spencer, FNP  TRUEPLUS INSULIN SYRINGE 28G X 1/2" 1 ML MISC USE TO GIVE INSULIN FIVE TIMES DAILY 05/12/22   Junie Spencer, FNP    Physical Exam: Vitals:   09/11/22 1607 09/11/22 2032  BP: 133/78 (!) 138/98  Pulse: 80 (!) 101  Resp: 18 (!) 24  Temp: 98 F (36.7 C) 98 F (36.7 C)  TempSrc: Oral Oral  SpO2: 93% 94%  Weight: 133.4 kg   Height: 6\' 3"  (1.905 m)     General: Vital signs reviewed.  Patient is well-developed and well-nourished, in no acute distress and cooperative with exam.  Head: Normocephalic and atraumatic. Eyes: EOMI, conjunctivae normal, no scleral icterus.  Neck: Supple, trachea midline, normal ROM,  Cardiovascular: Irregularly irregular. Pulmonary/Chest: Clear to auscultation bilaterally, no wheezes, rales, or rhonchi. Abdominal: Soft, non-tender, non-distended, BS +, abdominal wall edema.   Extremities: 1+ LE edema bilaterally, signs of chronic venous congestion bilaterally, with weeping wound on right lower leg with some crusting,  multiple small crusted wound bilaterally, and open wound on left plantar surface with surrounding erythema and serosanguineous discharge. Neurological: A&O x3, Strength is normal and symmetric bilaterally, cranial nerve II-XII are grossly intact, no focal motor deficit, sensory intact to light touch bilaterally.  Psychiatric: Normal mood and affect.   Data Reviewed: Prior data reviewed as mentioned above  Assessment and Plan: * Acute on chronic HFrEF (heart failure with reduced ejection fraction) (HCC) Patient presented with 20 pound weight gain, worsening lower extremity edema, abdominal wall edema, worsening exertional dyspnea, orthopnea and PND.  Recently increase Lasix by primary care provider.  BNP is mildly elevated at 180. Most recent echo done in August 2023 with a EF of 40 to 45%. -Admit to telemetry -Lasix 40 mg daily -Continue home metolazone -Daily BMP and weight -Strict intake and output -Echocardiogram  SIRS (systemic inflammatory response syndrome) (HCC) Patient presented with leukocytosis and tachycardia, also found to be in A-fib with RVR.  Chronic lower extremity wound with slightly increased erythema and drainage.  History of positive blood cultures and wound infections.  Blood cultures were obtained in ED. procalcitonin elevated at 0.33. If found a definitive source of infection then he will meet sepsis criteria. -Check ESR and CRP -Ceftriaxone and Flagyl -Follow-up blood cultures   Open wounds involving multiple regions of lower extremity Patient has an history of chronic lower extremity wound.  Patient has home health nurses coming for wound care.  Right lower leg wound with some increased  in weeping, left plantar wound just beneath greater and second toe with some serosanguineous discharge and surrounding erythema.  Slight worsening of pain in right lower extremity. -Ceftriaxone and Flagyl -Wound care  PAF (paroxysmal atrial fibrillation) (HCC) Patient has an  history of paroxysmal atrial fibrillation, found to be in A-fib with RVR. -Giving IV Cardizem-2 doses of 10 mg ordered -Continue home metoprolol -Continue home Eliquis  Diabetes (HCC) Patient seems like on basal, NovoLog and 70/30 at home which need to be addressed on discharge. -10 units of Semglee -Moderate SSI -4 units with meal  Diabetic peripheral neuropathy associated with type 2 diabetes mellitus (HCC) -Continue home gabapentin  Hypertension associated with diabetes (HCC) Continue home amlodipine and Imdur  PAOD (peripheral arterial occlusive disease) (HCC) -Continue home Plavix  Hyperlipidemia associated with type 2 diabetes mellitus (HCC) Patient has an history of statin allergy. -Continue home Zetia and fenofibrate  Iron deficiency anemia Hemoglobin seems stable -Continue home iron supplement  Class II obesity Estimated body mass index is 36.75 kg/m as calculated from the following:   Height as of this encounter: 6\' 3"  (1.905 m).   Weight as of this encounter: 133.4 kg.   -This will complicate overall prognosis  Gastroesophageal reflux disease -Continue PPI  AKI (acute kidney injury) (HCC) Seems like having CKD stage III, worsening creatinine, currently at 1.89 with baseline seems to be around 1.3-1.5. -Monitor renal function while he is being diuresed -Avoid nephrotoxins    Advance Care Planning:   Code Status: Full Code discussed with patient and wife  Consults: None  Family Communication: Discussed with wife at bedside  Severity of Illness: The appropriate patient status for this patient is INPATIENT. Inpatient status is judged to be reasonable and necessary in order to provide the required intensity of service to ensure the patient's safety. The patient's presenting symptoms, physical exam findings, and initial radiographic and laboratory data in the context of their chronic comorbidities is felt to place them at high risk for further clinical  deterioration. Furthermore, it is not anticipated that the patient will be medically stable for discharge from the hospital within 2 midnights of admission.   * I certify that at the point of admission it is my clinical judgment that the patient will require inpatient hospital care spanning beyond 2 midnights from the point of admission due to high intensity of service, high risk for further deterioration and high frequency of surveillance required.*  This record has been created using Conservation officer, historic buildings. Errors have been sought and corrected,but may not always be located. Such creation errors do not reflect on the standard of care.   Author: Arnetha Courser, MD 09/11/2022 9:39 PM  For on call review [email or web address].

## 2022-09-11 NOTE — ED Triage Notes (Signed)
Pt states he went to his PCP yesterday due to feeling weak and not well; pt has been having diarrhea that are dark green in color  The doctors office called pt back and told them to come to ED due to increased WBC and kidney function  Pt c/o headache and neck pain

## 2022-09-11 NOTE — ED Provider Notes (Signed)
[place] [facility] AT [facility] Provider Note   CSN: [id number] Arrival date & time: 09/11/22  1536     History  Chief Complaint  Patient presents with   Weakness    Deyon A Robar is a 72 y.o. male with a history including type 2 diabetes with peripheral vascular disease and chronic lower extremity ulcerations, history of atrial fibrillation, CAD with history of MI, diastolic CHF, hyperlipidemia and frequent episodes of sepsis reporting with a several day history of generalized weakness, 6 several episodes of diarrhea daily, dark green in coloration.  Also has complaint of generalized headache which radiates into his neck.  He has been afebrile.  He was seen by his primary provider yesterday secondary to the symptoms and basic lab results revealed worsening kidney function, he was sent here for further evaluation.   Additionally he notes a significant weight gain, typically he weighs around 269, his most recent home weight was 291.  He is unable to say whether he has orthopnea as he chronically sleeps in a recliner chair as he states this is the easiest way to elevate his legs which chronically stay edematous.  He denies fevers or chills, cough, chest pain.  He does have increased shortness of breath with exertion.  The history is provided by the patient and the spouse.   Weakness Associated symptoms: shortness of breath   Associated symptoms: no abdominal pain, no arthralgias, no chest pain, no dizziness, no fever, no headaches and no nausea    Weakness Associated symptoms: shortness of breath   Associated symptoms: no chest pain    Weakness Associated symptoms: shortness of breath        Home Medications Prior to Admission medications   Medication Sig Start Date End Date Taking? Authorizing Provider  albuterol (VENTOLIN HFA) 108 (90 Base) MCG/ACT inhaler Inhale 2 puffs into the lungs every 6 (six) hours as needed for wheezing or shortness of breath.  11/04/21   Delynn Flavin M, DO  apixaban (ELIQUIS) 5 MG TABS tablet TAKE ONE (1) TABLET BY MOUTH TWICE DAILY 09/04/22   Jannifer Rodney A, FNP  baclofen (LIORESAL) 10 MG tablet Take 0.5-1 tablets (5-10 mg total) by mouth 3 (three) times daily. 07/02/22   Junie Spencer, FNP  Calcium Carbonate Antacid (ALKA-SELTZER ANTACID PO) Take 1 tablet by mouth 2 (two) times daily as needed (heartburn, indigestion).    [provider]  clopidogrel (PLAVIX) 75 MG tablet TAKE 1 TABLET BY MOUTH DAILY 11/06/21   Chuck Hint, MD  doxycycline (VIBRA-TABS) 100 MG tablet Take 1 tablet (100 mg total) by mouth 2 (two) times daily. 09/10/22   Junie Spencer, FNP  DULoxetine (CYMBALTA) 60 MG capsule Take 1 capsule (60 mg total) by mouth daily. 08/04/22   Junie Spencer, FNP  ezetimibe (ZETIA) 10 MG tablet TAKE 1 TABLET BY MOUTH DAILY 04/02/22   Wendall Stade, MD  fenofibrate 160 MG tablet TAKE 1 TABLET BY MOUTH DAILY 07/02/22   Jannifer Rodney A, FNP  FEROSUL 325 (65 Fe) MG tablet Take 325 mg by mouth every morning. 05/26/22   [provider]  furosemide (LASIX) 40 MG tablet Take 1 tablet (40 mg total) by mouth daily. 09/10/22   Junie Spencer, FNP  gabapentin (NEURONTIN) 300 MG capsule Take 3 capsules (900 mg total) by mouth 2 (two) times daily. 08/04/22   Junie Spencer, FNP  HYDROcodone-acetaminophen (NORCO) 10-325 MG tablet Take 1 tablet by mouth every 6 (six) hours  as needed for moderate pain. 08/11/22   Jannifer Rodney A, FNP  insulin degludec (TRESIBA FLEXTOUCH) 200 UNIT/ML FlexTouch Pen Inject 20 Units into the skin at bedtime. 06/18/22   Jannifer Rodney A, FNP  insulin lispro (HUMALOG) 100 UNIT/ML KwikPen INJECT 15-20 UNITS SUBCUTANEOUSLY THREE TIMES A DAY WITH MEALS Patient taking differently: Inject 15 Units into the skin 3 (three) times daily before meals. 12/03/21   Junie Spencer, FNP  insulin NPH-regular Human (HUMULIN 70/30) (70-30) 100 UNIT/ML injection INJECT 60 UNITS  SUBCUTANEOUSLY TWICE DAILY WITH MEAL 06/23/22   Jannifer Rodney A, FNP  isosorbide mononitrate (IMDUR) 30 MG 24 hr tablet TAKE 1 TABLET BY MOUTH DAILY 12/03/21   Hawks, Neysa Bonito A, FNP  Menthol, Topical Analgesic, (BLUE-EMU MAXIMUM STRENGTH EX) Apply 1 application  topically daily as needed (pain).    [provider]  metolazone (ZAROXOLYN) 5 MG tablet Take 5 mg by mouth daily.    [provider]  metoprolol succinate (TOPROL-XL) 50 MG 24 hr tablet TAKE 1 TABLET BY MOUTH DAILY 07/31/22   Marinus Maw, MD  metoprolol tartrate (LOPRESSOR) 50 MG tablet Take 50 mg by mouth daily as needed (atrial fibrillation).    [provider]  nitroGLYCERIN (NITROSTAT) 0.4 MG SL tablet DISSOLVE ONE TABLET UNDER THE TONGUE EVERY 5 MINUTES AS NEEDED FOR CHEST PAIN.  DO NOT EXCEED A TOTAL OF 3 DOSES IN 15 MINUTES Strength: 0.4 mg 04/28/21   Wendall Stade, MD  pantoprazole (PROTONIX) 40 MG tablet Take 1 tablet (40 mg total) by mouth 2 (two) times daily. Patient taking differently: Take 40 mg by mouth daily. 01/26/22   Tyrone Nine, MD  polyethylene glycol-electrolytes (NULYTELY) 420 g solution Take by mouth once. 04/15/22   [provider]  tirzepatide Greggory Keen) 10 MG/0.5ML Pen Inject 10 mg into the skin once a week. 03/16/22   Junie Spencer, FNP  TRUEPLUS INSULIN SYRINGE 28G X 1/2" 1 ML MISC USE TO GIVE INSULIN FIVE TIMES DAILY 05/12/22   Jannifer Rodney A, FNP      Allergies    Shellfish allergy, Sulfa antibiotics, Ace inhibitors, Invokana [canagliflozin], Lexapro [escitalopram], Metformin and related, Pravachol [pravastatin sodium], Repatha [evolocumab], Tricor [fenofibrate], Zestril [lisinopril], Farxiga [dapagliflozin], Crestor [rosuvastatin], Horse-derived products, Lipitor [atorvastatin], Livalo [pitavastatin], Milk (cow), and Tape    Review of Systems   Review of Systems  Constitutional:  Positive for fatigue. Negative for fever.  HENT:  Negative for congestion and sore  throat.   Eyes: Negative.   Respiratory:  Positive for shortness of breath. Negative for chest tightness.   Cardiovascular:  Negative for chest pain and leg swelling.  Gastrointestinal:  Negative for abdominal pain and nausea.  Genitourinary: Negative.   Musculoskeletal:  Negative for arthralgias, joint swelling and neck pain.  Skin:  Positive for wound. Negative for rash.  Neurological:  Positive for weakness. Negative for dizziness, light-headedness, numbness and headaches.  Psychiatric/Behavioral: Negative.      Physical Exam Updated Vital Signs BP 133/78 (BP Location: Right Arm)   Pulse 80   Temp 98 F (36.7 C) (Oral)   Resp 18   Ht 6\' 3"  (1.905 m)   Wt 133.4 kg   SpO2 93%   BMI 36.75 kg/m  Physical Exam Vitals and nursing note reviewed.  Constitutional:      Appearance: He is well-developed.  HENT:     Head: Normocephalic and atraumatic.  Eyes:     Conjunctiva/sclera: Conjunctivae normal.  Cardiovascular:     Rate and  Rhythm: Rhythm irregular.     Heart sounds: Normal heart sounds.  Pulmonary:     Effort: Pulmonary effort is normal.     Breath sounds: Rales present. No wheezing.     Comments: Reduced breath sounds throughout all lung fields Abdominal:     General: Bowel sounds are normal. There is distension.     Palpations: Abdomen is soft.     Tenderness: There is no abdominal tenderness. There is no guarding.  Musculoskeletal:        General: Normal range of motion.     Cervical back: Normal range of motion.     Right lower leg: Edema present.     Left lower leg: Edema present.     Comments: Bilateral lower extremity edema with dry scaling skin and scattered areas of scabbing.  There is no erythema suggesting cellulitis.  Dorsalis pedal pulses are present with bedside Doppler.  Skin:    General: Skin is warm and dry.  Neurological:     General: No focal deficit present.     Mental Status: He is alert and oriented to person, place, and time.     ED  Results / Procedures / Treatments   Labs (all labs ordered are listed, but only abnormal results are displayed) Labs Reviewed  BASIC METABOLIC PANEL - Abnormal; Notable for the following components:      Result Value   Sodium 129 (*)    Chloride 89 (*)    Glucose, Bld 128 (*)    BUN 41 (*)    Creatinine, Ser 1.89 (*)    GFR, Estimated 37 (*)    All other components within normal limits  CBC - Abnormal; Notable for the following components:   WBC 14.0 (*)    Hemoglobin 11.7 (*)    HCT 38.4 (*)    RDW 15.6 (*)    Platelets 463 (*)    All other components within normal limits  BRAIN NATRIURETIC PEPTIDE - Abnormal; Notable for the following components:   B Natriuretic Peptide 180.0 (*)    All other components within normal limits  TROPONIN I (HIGH SENSITIVITY) - Abnormal; Notable for the following components:   Troponin I (High Sensitivity) 29 (*)    All other components within normal limits  CULTURE, BLOOD (ROUTINE X 2)  CULTURE, BLOOD (ROUTINE X 2)  URINALYSIS, ROUTINE W REFLEX MICROSCOPIC  LACTIC ACID, PLASMA  LACTIC ACID, PLASMA  CBG MONITORING, ED    EKG EKG Interpretation  Date/Time:  Friday September 11 2022 17:59:23 EDT Ventricular Rate:  104 PR Interval:    QRS Duration: 101 QT Interval:  307 QTC Calculation: 404 R Axis:   102 Text Interpretation: Atrial fibrillation Right axis deviation Low voltage, precordial leads Nonspecific T abnormalities, diffuse leads Confirmed by Alvino Blood ([id number]) on 09/11/2022 6:55:30 PM  Radiology DG Chest Portable 1 View  Result Date: 09/11/2022 CLINICAL DATA:  Weakness and shortness of breath EXAM: PORTABLE CHEST 1 VIEW COMPARISON:  X-[place] 09/10/2022 FINDINGS: Enlarged cardiopericardial silhouette. Calcified aorta. Vascular congestion. No pneumothorax, effusion. Fixation hardware noted along the lower cervical spine at the edge of the imaging field. Left-sided loop recorder. IMPRESSION: Enlarged heart.  Vascular congestion.  Loop  recorder Electronically Signed   By: Karen Kays M.D.   On: 09/11/2022 17:54   DG Chest 2 View  Result Date: 09/10/2022 CLINICAL DATA:  Shortness of breath EXAM: CHEST - 2 VIEW COMPARISON:  Chest x-[place] dated January 21, 2022 FINDINGS: Unchanged cardiomegaly. Loop recorder device. Diffuse  interstitial opacities. No evidence of pleural effusion or pneumothorax. IMPRESSION: Interstitial opacities, likely due to pulmonary edema. Electronically Signed   By: Allegra Lai M.D.   On: 09/10/2022 15:38    Procedures Procedures    Medications Ordered in ED Medications  oxyCODONE-acetaminophen (PERCOCET/ROXICET) 5-325 MG per tablet 1 tablet (1 tablet Oral Given 09/11/22 1901)  furosemide (LASIX) injection 60 mg (60 mg Intravenous Given 09/11/22 1902)    ED Course/ Medical Decision Making/ A&P                             Medical Decision Making Pt presenting with increased weakness, cough, sob, chronic peripheral edema, episodes of loose stool, but has increased stool softener use due to constipation.  Elevated creatinine and wbc count in office ytd, pcp advised to to ED.  Initial exam and hx suggesting acute on chronic chf.  During ed stay, pt developed tachycardia, 100-120 range,  no cp, no increased sob, but mild tachypnea to 26.  Lactic acid, blood cx added due to new SIRS criteria.  CXR does not suggest pneumonia.  He has no productive cough, afebrile.    Amount and/or Complexity of Data Reviewed Labs: ordered.    Details: WBC count of 14.0, initial troponin is 29, prior troponins from 7 months ago where he was admitted with sepsis, had significant troponin elevations, suspected to be secondary to demand ischemia.  Patient has no chest pain today.  Pending delta troponin at this time.  He does have a BUN of 41, creatinine of 1.89 and a GFR of 37, these labs are relatively stable in comparison with prior creatinines with creatinine range from 1.59 to 1.91 over the past 7 month.   His WBC count  is 14.0 today, of unclear etiology, no obvious infection source found at this time, pending urinalysis. Radiology: ordered.    Details: Chest x-[place] reviewed, agree with interpretation of interstitial edema, cardiomegaly. Discussion of management or test interpretation with external provider(s): Spoke with Dr. Nelson Chimes with the hospitalist service - accepts pt for admission.  Risk Decision regarding hospitalization.           Final Clinical Impression(s) / ED Diagnoses Final diagnoses:  Acute on chronic diastolic congestive heart failure (HCC)  Renal insufficiency  Leukocytosis, unspecified type    Rx / DC Orders ED Discharge Orders     None         Victoriano Lain 09/11/22 1945    Lonell Grandchild, MD 09/11/22 2342

## 2022-09-11 NOTE — Assessment & Plan Note (Signed)
-   Continue home Plavix 

## 2022-09-11 NOTE — Assessment & Plan Note (Signed)
Continue PPI ?

## 2022-09-11 NOTE — Assessment & Plan Note (Signed)
Patient presented with leukocytosis and tachycardia, also found to be in A-fib with RVR.  Chronic lower extremity wound with slightly increased erythema and drainage.  History of positive blood cultures and wound infections.  Blood cultures were obtained in ED. procalcitonin elevated at 0.33. If found a definitive source of infection then he will meet sepsis criteria. -Check ESR and CRP -Ceftriaxone and Flagyl -Follow-up blood cultures

## 2022-09-12 ENCOUNTER — Inpatient Hospital Stay (HOSPITAL_COMMUNITY): Payer: Medicare Other

## 2022-09-12 DIAGNOSIS — R9431 Abnormal electrocardiogram [ECG] [EKG]: Secondary | ICD-10-CM | POA: Diagnosis not present

## 2022-09-12 LAB — C-REACTIVE PROTEIN: CRP: 19.6 mg/dL — ABNORMAL HIGH (ref <1.0)

## 2022-09-12 LAB — ECHOCARDIOGRAM COMPLETE
AR max vel: 2.18 cm2
AV Area VTI: 2.15 cm2
AV Area mean vel: 2.07 cm2
AV Mean grad: 5 mmHg
AV Peak grad: 9.1 mmHg
Ao pk vel: 1.51 m/s
Area-P 1/2: 4.86 cm2
Height: 75 in
MV VTI: 2.16 cm2
S' Lateral: 4.4 cm
Weight: 4704 oz

## 2022-09-12 LAB — CBG MONITORING, ED
Glucose-Capillary: 136 mg/dL — ABNORMAL HIGH (ref 70–99)
Glucose-Capillary: 168 mg/dL — ABNORMAL HIGH (ref 70–99)
Glucose-Capillary: 191 mg/dL — ABNORMAL HIGH (ref 70–99)

## 2022-09-12 LAB — BASIC METABOLIC PANEL
Anion gap: 11 (ref 5–15)
BUN: 41 mg/dL — ABNORMAL HIGH (ref 8–23)
CO2: 29 mmol/L (ref 22–32)
Calcium: 9.2 mg/dL (ref 8.9–10.3)
Chloride: 92 mmol/L — ABNORMAL LOW (ref 98–111)
Creatinine, Ser: 1.84 mg/dL — ABNORMAL HIGH (ref 0.61–1.24)
GFR, Estimated: 39 mL/min — ABNORMAL LOW (ref >60)
Glucose, Bld: 145 mg/dL — ABNORMAL HIGH (ref 70–99)
Potassium: 3.8 mmol/L (ref 3.5–5.1)
Sodium: 132 mmol/L — ABNORMAL LOW (ref 135–145)

## 2022-09-12 LAB — PREALBUMIN: Prealbumin: 10 mg/dL — ABNORMAL LOW (ref 18–38)

## 2022-09-12 LAB — GLUCOSE, CAPILLARY
Glucose-Capillary: 189 mg/dL — ABNORMAL HIGH (ref 70–99)
Glucose-Capillary: 260 mg/dL — ABNORMAL HIGH (ref 70–99)

## 2022-09-12 LAB — CULTURE, BLOOD (ROUTINE X 2)

## 2022-09-12 LAB — HEMOGLOBIN A1C
Hgb A1c MFr Bld: 9.2 % — ABNORMAL HIGH (ref 4.8–5.6)
Mean Plasma Glucose: 217.34 mg/dL

## 2022-09-12 MED ORDER — PERFLUTREN LIPID MICROSPHERE
1.0000 mL | INTRAVENOUS | Status: AC | PRN
  Administered 2022-09-12: 3 mL via INTRAVENOUS

## 2022-09-12 NOTE — ED Notes (Signed)
Pt provided bedside fan per Hospitalist order at Pt's request.

## 2022-09-12 NOTE — Progress Notes (Signed)
*  PRELIMINARY RESULTS* Echocardiogram 2D Echocardiogram has been performed.  [name] [name] 09/12/2022, 1:00 PM

## 2022-09-12 NOTE — ED Notes (Signed)
Patient moved to recliner. Urinal and call bell in reach.

## 2022-09-12 NOTE — ED Notes (Signed)
Attempted to call [facility]. Receiving unit had call out and unsure if they are accepting patients. Awaiting a call back

## 2022-09-12 NOTE — ED Notes (Signed)
Patient denies pain and is resting comfortably.  

## 2022-09-12 NOTE — ED Notes (Signed)
Pt given breakfast tray

## 2022-09-12 NOTE — TOC Initial Note (Addendum)
Transition of Care [facility]) - Initial/Assessment Note    Patient Details  Name: [name] [name] MRN: [id number] Date of Birth: 12/12/1950  Transition of Care (TOC) CM/SW Contact:    Catalina Gravel, LCSW Phone Number: 09/12/2022, 5:21 PM  Clinical Narrative:                 Pt admitted SOB. Notes indicate [facility] involved. DC Sunday.    Barriers to Discharge: Continued Medical Work up   Patient Goals and CMS Choice            Expected Discharge Plan and Services                                              Prior Living Arrangements/Services                       Activities of Daily Living Home Assistive Devices/Equipment: Wheelchair ADL Screening (condition at time of admission) Patient's cognitive ability adequate to safely complete daily activities?: Yes Is the patient deaf or have difficulty hearing?: Yes Does the patient have difficulty seeing, even when wearing glasses/contacts?: No Does the patient have difficulty concentrating, remembering, or making decisions?: No Patient able to express need for assistance with ADLs?: Yes Does the patient have difficulty dressing or bathing?: Yes Independently performs ADLs?: No Communication: Independent Dressing (OT): Needs assistance Is this a change from baseline?: Pre-admission baseline Grooming: Needs assistance Is this a change from baseline?: Pre-admission baseline Feeding: Independent Bathing: Needs assistance Is this a change from baseline?: Pre-admission baseline Toileting: Needs assistance Is this a change from baseline?: Pre-admission baseline In/Out Bed: Needs assistance Is this a change from baseline?: Pre-admission baseline Walks in Home: Needs assistance Is this a change from baseline?: Pre-admission baseline Does the patient have difficulty walking or climbing stairs?: Yes Weakness of Legs: Both Weakness of Arms/Hands: Both  Permission Sought/Granted                   Emotional Assessment              Admission diagnosis:  PAF (paroxysmal atrial fibrillation) (HCC) [I48.0] Renal insufficiency [N28.9] Acute on chronic diastolic congestive heart failure (HCC) [I50.33] Leukocytosis, unspecified type [D72.829] Acute on chronic HFrEF (heart failure with reduced ejection fraction) (HCC) [I50.23] Patient Active Problem List   Diagnosis Date Noted   Acute on chronic HFrEF (heart failure with reduced ejection fraction) (HCC) 09/11/2022   Open wounds involving multiple regions of lower extremity 09/11/2022   Renal insufficiency 09/11/2022   Dysphagia 03/17/2022   Cholecystitis, acute    Elevated lactic acid level    Generalized weakness    Elevated liver function tests 01/22/2022   Nausea and vomiting 01/22/2022   Choledocholithiasis 01/22/2022   Lactic acidosis 01/22/2022   Recurrent bacteremia 01/21/2022   Dry gangrene (HCC) 01/21/2022   Colonization status 01/21/2022   Pressure injury of skin 12/18/2021   SIRS (systemic inflammatory response syndrome) (HCC) 12/17/2021   Iron deficiency anemia 10/16/2021   Critical lower limb ischemia (HCC) 08/16/2021   Atherosclerosis of arteries of extremities (HCC) 08/15/2021   Coronary artery disease of native artery of native heart with stable angina pectoris (HCC)    Leg wound, left 07/25/2021   S/P femoral-popliteal bypass surgery 11/22/2020   PAOD (peripheral arterial occlusive disease) (HCC) 11/22/2020   Non-ST elevation (NSTEMI) myocardial infarction (  HCC) 05/23/2020   Orthostatic hypotension 01/01/2020   Statin myopathy 12/06/2019   Diarrhea 05/10/2018   PAF (paroxysmal atrial fibrillation) (HCC) 03/29/2018   Lacunar infarction (HCC) 03/29/2018   Unstable angina (HCC)    Chest pain 01/18/2018   CHF (congestive heart failure) (HCC) 10/21/2017   Anxiety 03/09/2017   Constipation 11/26/2016   History of non-ST elevation myocardial infarction (NSTEMI) 07/29/2016   Diabetes (HCC) 05/25/2016    Occlusion and stenosis of vertebral artery    AKI (acute kidney injury) (HCC) 05/23/2016   Acute kidney failure, unspecified (HCC) 05/23/2016   Diabetic neuropathy (HCC) 01/13/2016   Depression 11/01/2015   Leukocytosis 01/23/2015   Erectile dysfunction 01/23/2015   Class II obesity 01/23/2015   Periodontal disease 01/23/2015   Stopped smoking with greater than 30 pack year history 01/23/2015   Diabetic peripheral neuropathy associated with type 2 diabetes mellitus (HCC) 11/08/2014   Mucosal abnormality of stomach    History of adenomatous polyp of colon    Gastroesophageal reflux disease 09/06/2014   Low serum testosterone level 08/01/2014   DDD (degenerative disc disease), cervical 12/20/2013   Headache 08/07/2013   Medically noncompliant 10/25/2011   CAD S/P percutaneous coronary angioplasty 10/25/2011   MURMUR 04/16/2009   Hypertension associated with diabetes (HCC) 08/02/2008   Hyperlipidemia associated with type 2 diabetes mellitus (HCC) 07/31/2008   DISORDERS OF IRON METABOLISM 07/31/2008   PCP:  Junie Spencer, FNP Pharmacy:   [facility] [address], [place] - [address] [place] [address] Phone: [phone] Fax: [phone]  [facility] - [place], [place] - [address] Time [address] Phone: [phone] Fax: [phone]  [facility] [place] - [place], [place] - [address] [address] Phone: [phone] Fax: [phone]     Social Determinants of Health (SDOH) Social History: SDOH Screenings   Food Insecurity: No Food Insecurity (09/12/2022)  Housing: Low Risk  (09/12/2022)  Transportation Needs: No Transportation Needs (09/12/2022)  Utilities: Not At Risk (09/12/2022)  Alcohol Screen: Low Risk  (01/06/2021)  Depression (PHQ2-9): High Risk (06/16/2022)  Financial Resource Strain: Medium Risk (01/06/2021)  Physical Activity:  Insufficiently Active (01/06/2021)  Social Connections: Moderately Isolated (01/06/2021)  Stress: No Stress Concern Present (02/16/2022)  Tobacco Use: Medium Risk (09/11/2022)   SDOH Interventions: Housing Interventions: Intervention Not Indicated   Readmission Risk Interventions    01/26/2022   11:45 AM 08/20/2021   10:36 AM 11/25/2020    1:52 PM  Readmission Risk Prevention Plan  Transportation Screening Complete Complete Complete  PCP or Specialist Appt within 5-7 Days  Complete Complete  PCP or Specialist Appt within 3-5 Days Complete    Home Care Screening  Complete Complete  Medication Review (RN CM)  Complete Complete  HRI or Home Care Consult Complete    Social Work Consult for Recovery Care Planning/Counseling Complete    Palliative Care Screening Not Applicable    Medication Review Oceanographer) Complete

## 2022-09-12 NOTE — Inpatient Diabetes Management (Signed)
Inpatient Diabetes Program Recommendations  AACE/ADA: New Consensus Statement on Inpatient Glycemic Control   Target Ranges:  Prepandial:   less than 140 mg/dL      Peak postprandial:   less than 180 mg/dL (1-2 hours)      Critically ill patients:  140 - 180 mg/dL    Latest Reference Range & Units 09/12/22 06:28  Glucose 70 - 99 mg/dL 409 (H)    Latest Reference Range & Units 09/11/22 22:46 09/12/22 01:29  Glucose-Capillary 70 - 99 mg/dL 811 (H) 914 (H)   Review of Glycemic Control  Diabetes history: DM2 Outpatient Diabetes medications: Tresiba 20 units QHS, Humalog 15-20 units TID with meals, 70/30 60-70 units BID with meals, Mounjaro 10 mg Qweek Current orders for Inpatient glycemic control: Semglee 10 units QHS, Novolog 0-15 units TID with meals, Novolog 0-5 units QHS, Novolog 4 units TID with meals  Inpatient Diabetes Program Recommendations:    Insulin: Agree with inpatient insulin orders.  Outpatient DM medications: Per home medication list patient is taking Tresiba, Humalog and 70/30 insulins. May want to discuss outpatient insulin regimen with patient and address at time of discharge.  NOTE: Noted consult for Diabetes Coordinator. Diabetes Coordinator is not on campus over the weekend but available by pager from 8am to 5pm for questions or concerns. Patient admitted with CHF, SIRS, open wounds on lower extremities. Per chart patient sees Jannifer Rodney, FNP and was last seen on 09/10/22. Per office note on 09/10/22 patient is prescribed Tresiba 20 units QHS, Humalog 15-20 units TID with meals (patient taking 15 units TID), 70/30 60 units BID for DM (does not list Mounjaro). Would recommend discussing DM medications with patient and addressing outpatient insulin regimen at discharge since patient is on ultra long acting basal insulin, short acting insulin with meals, as well as mixed insulin with meals.  Thanks, Orlando Penner, RN, MSN, CDCES Diabetes Coordinator Inpatient Diabetes  Program [phone] (Team Pager from 8am to 5pm)

## 2022-09-13 DIAGNOSIS — E1159 Type 2 diabetes mellitus with other circulatory complications: Secondary | ICD-10-CM

## 2022-09-13 DIAGNOSIS — I152 Hypertension secondary to endocrine disorders: Secondary | ICD-10-CM

## 2022-09-13 DIAGNOSIS — E785 Hyperlipidemia, unspecified: Secondary | ICD-10-CM

## 2022-09-13 LAB — BASIC METABOLIC PANEL
Anion gap: 11 (ref 5–15)
BUN: 40 mg/dL — ABNORMAL HIGH (ref 8–23)
CO2: 30 mmol/L (ref 22–32)
Calcium: 9.3 mg/dL (ref 8.9–10.3)
Chloride: 91 mmol/L — ABNORMAL LOW (ref 98–111)
Creatinine, Ser: 1.86 mg/dL — ABNORMAL HIGH (ref 0.61–1.24)
GFR, Estimated: 38 mL/min — ABNORMAL LOW (ref >60)
Glucose, Bld: 205 mg/dL — ABNORMAL HIGH (ref 70–99)
Potassium: 4.4 mmol/L (ref 3.5–5.1)
Sodium: 132 mmol/L — ABNORMAL LOW (ref 135–145)

## 2022-09-13 LAB — GLUCOSE, CAPILLARY
Glucose-Capillary: 191 mg/dL — ABNORMAL HIGH (ref 70–99)
Glucose-Capillary: 228 mg/dL — ABNORMAL HIGH (ref 70–99)

## 2022-09-13 MED ORDER — INSULIN LISPRO (1 UNIT DIAL) 100 UNIT/ML (KWIKPEN): 5.0000 [IU] | PEN_INJECTOR | Freq: Three times a day (TID) | SUBCUTANEOUS | Status: DC

## 2022-09-13 MED ORDER — FUROSEMIDE 40 MG PO TABS
60.0000 mg | ORAL_TABLET | Freq: Every day | ORAL | 1 refills | Status: DC
Start: 2022-09-13 — End: 2022-09-25

## 2022-09-13 MED ORDER — TRESIBA FLEXTOUCH 200 UNIT/ML ~~LOC~~ SOPN: 10.0000 [IU] | PEN_INJECTOR | Freq: Every evening | SUBCUTANEOUS | 2 refills | Status: DC

## 2022-09-13 MED ORDER — PANTOPRAZOLE SODIUM 40 MG PO TBEC
40.0000 mg | DELAYED_RELEASE_TABLET | Freq: Every day | ORAL | Status: DC
Start: 2022-09-13 — End: 2022-10-29

## 2022-09-13 NOTE — Discharge Summary (Signed)
Physician Discharge Summary  KOHLER PELLERITO UJW:[id number] DOB: 1950-11-08 DOA: 09/11/2022  PCP: Junie Spencer, FNP Cardiology: Audrie Gallus (EP) Admit date: 09/11/2022 Discharge date: 09/13/2022  Admitted From: Home  Disposition: Home   Recommendations for Outpatient Follow-up:  Follow up with PCP in 1 weeks Please follow up with cardiology in 2 weeks Please obtain BMP in 1-2 weeks Please monitor BS closely and reduce insulin doses for any blood sugar less than 110  Discharge Condition: STABLE   CODE STATUS: FULL DIET: 2 gram sodium, carb modified    Brief Hospitalization Summary: Please see all hospital notes, images, labs for full details of the hospitalization. ADMISSION HPI:   72 y.o. male with medical history significant of HFrEF, chronic lower extremity wound, PAD, CAD, diabetes mellitus and hypertension presented to ED with complaint of worsening shortness of breath, orthopnea and PND for the past couple of weeks.   Per patient he has gained 20 pounds of weight, with worsening shortness of breath.  Orthopnea and PND seems chronic as he most of the time sleeping in recliner.  He saw his PCP for similar reason and Lasix dose was increased from 20 mg daily to 40 mg daily 2 days ago.  He was called by PCP due to his labs showing AKI and advised to come to ED for further evaluation.   Patient was also experiencing worsening weakness and loss of appetite for the past 2 to 3 days.  No fever or chills.  No other recent illnesses or upper respiratory symptoms.  Denies any chest pain.  No urinary symptoms.   Patient was having some constipation and abdominal pain, 1 week ago and for which he increased his dose of stool softener.  Had 4 bowel movement since morning which he attributes to the use of laxative.  No diarrhea.  Abdominal pain has been improved since then.   He was complaining of worsening right lower extremity pain surrounding his chronic wound.  Also noticed more discharge  from left plantar surface wound.  He was placed on doxycycline by his PCP 2 days ago.  He has a home health nurse coming home for wound care.   ED Course. Vital with HR 110, BP 138/98, afebrile, WBC 14.0, Hgb 11.7, BMP with Na 129,Cl 89, BUN 41, Creatinine 1.89, (Baseline 1.3-1.5), trop 29.BNP 180. CXR. With pulmonary vascular congestion. EKG: Personally reviewed , A fib with HR 104.   Received IV lasix 60 mg in ED.  Hospital Course   Pt was admitted and treated with serial doses of IV furosemide.  He responded well to this therapy and has diuresed nearly 2L since admission.  He has been wanting to go home since 4/27 and today he is insisting on going  home.  He says he feels back to his baseline and feels well to manage at home on his home oral furosemide and admits that he had missed some doses of home furosemide.  He was advised to weigh daily, restrict fluid and sodium, monitor blood glucose closely.  We have reduced his insulin doses from reported home doses significantly as he has not required nearly as much insulin.  He may eventually come off insulin completely as he is now on GIP/GLP agonist therapy.  Hypoglycemia precautions given. Pt stable to discharge home today with wife and follow up with his cardiologists.   Discharge Diagnoses:  Principal Problem:   Acute on chronic HFrEF (heart failure with reduced ejection fraction) (HCC) Active Problems:   Hyperlipidemia  associated with type 2 diabetes mellitus (HCC)   Hypertension associated with diabetes (HCC)   Gastroesophageal reflux disease   Diabetic peripheral neuropathy associated with type 2 diabetes mellitus (HCC)   AKI (acute kidney injury) (HCC)   Diabetes (HCC)   Leukocytosis   Class II obesity   PAF (paroxysmal atrial fibrillation) (HCC)   PAOD (peripheral arterial occlusive disease) (HCC)   Iron deficiency anemia   SIRS (systemic inflammatory response syndrome) (HCC)   Open wounds involving multiple regions of lower  extremity   Renal insufficiency   Discharge Instructions: Discharge Instructions     Amb referral to [facility]   Complete by: As directed    Ambulatory referral to Cardiology   Complete by: As directed    Hospital follow up      Allergies as of 09/13/2022       Reactions   Shellfish Allergy Anaphylaxis, Hives, Swelling   Tongue swelling   Sulfa Antibiotics Anaphylaxis, Hives, Swelling   Tongue swelling   Ace Inhibitors Other (See Comments), Cough   CKD, renal failure    Invokana [canagliflozin] Other (See Comments)   Syncope Dehydration    Lexapro [escitalopram] Other (See Comments)   Buzzing in ears Headaches "Felt like a zombie"   Metformin And Related Itching   Pravachol [pravastatin Sodium] Other (See Comments)   Myalgias    Repatha [evolocumab] Other (See Comments)   Myalgias Flu like symptoms   Tricor [fenofibrate] Other (See Comments)   Myalgias    Zestril [lisinopril] Cough   Farxiga [dapagliflozin] Other (See Comments)   Nephro d/c'd   Crestor [rosuvastatin] Other (See Comments)   Myalgias   Horse-derived Products Rash   horse serum   Lipitor [atorvastatin] Other (See Comments)   Myalgias    Livalo [pitavastatin] Other (See Comments)   Myalgias   Milk (cow) Diarrhea, Nausea Only   Stomach upset   Tape Rash        Medication List     STOP taking these medications    baclofen 10 MG tablet Commonly known as: LIORESAL   doxycycline 100 MG tablet Commonly known as: VIBRA-TABS   HumuLIN 70/30 (70-30) 100 UNIT/ML injection Generic drug: insulin NPH-regular Human       TAKE these medications    albuterol 108 (90 Base) MCG/ACT inhaler Commonly known as: VENTOLIN HFA Inhale 2 puffs into the lungs every 6 (six) hours as needed for wheezing or shortness of breath.   ALKA-SELTZER ANTACID PO Take 1 tablet by mouth 2 (two) times daily as needed (heartburn, indigestion).   BLUE-EMU MAXIMUM STRENGTH EX Apply 1 application  topically daily  as needed (pain).   clopidogrel 75 MG tablet Commonly known as: PLAVIX TAKE 1 TABLET BY MOUTH DAILY   DULoxetine 60 MG capsule Commonly known as: Cymbalta Take 1 capsule (60 mg total) by mouth daily.   Eliquis 5 MG Tabs tablet Generic drug: apixaban TAKE ONE (1) TABLET BY MOUTH TWICE DAILY What changed: See the new instructions.   ezetimibe 10 MG tablet Commonly known as: ZETIA TAKE 1 TABLET BY MOUTH DAILY   fenofibrate 160 MG tablet TAKE 1 TABLET BY MOUTH DAILY   FeroSul 325 (65 FE) MG tablet Generic drug: ferrous sulfate Take 325 mg by mouth every morning.   furosemide 40 MG tablet Commonly known as: LASIX Take 1.5 tablets (60 mg total) by mouth daily. What changed: how much to take   gabapentin 300 MG capsule Commonly known as: NEURONTIN Take 3 capsules (900 mg total)  by mouth 2 (two) times daily.   HYDROcodone-acetaminophen 10-325 MG tablet Commonly known as: NORCO Take 1 tablet by mouth every 6 (six) hours as needed for moderate pain.   insulin lispro 100 UNIT/ML KwikPen Commonly known as: HUMALOG Inject 5 Units into the skin 3 (three) times daily before meals. What changed: See the new instructions.   isosorbide mononitrate 30 MG 24 hr tablet Commonly known as: IMDUR TAKE 1 TABLET BY MOUTH DAILY   metolazone 5 MG tablet Commonly known as: ZAROXOLYN Take 5 mg by mouth daily.   metoprolol succinate 50 MG 24 hr tablet Commonly known as: TOPROL-XL TAKE 1 TABLET BY MOUTH DAILY   metoprolol tartrate 50 MG tablet Commonly known as: LOPRESSOR Take 50 mg by mouth daily as needed (atrial fibrillation).   nitroGLYCERIN 0.4 MG SL tablet Commonly known as: NITROSTAT DISSOLVE ONE TABLET UNDER THE TONGUE EVERY 5 MINUTES AS NEEDED FOR CHEST PAIN.  DO NOT EXCEED A TOTAL OF 3 DOSES IN 15 MINUTES Strength: 0.4 mg   pantoprazole 40 MG tablet Commonly known as: PROTONIX Take 1 tablet (40 mg total) by mouth daily.   tirzepatide 10 MG/0.5ML Pen Commonly known as:  MOUNJARO Inject 10 mg into the skin once a week.   Evaristo Bury FlexTouch 200 UNIT/ML FlexTouch Pen Generic drug: insulin degludec Inject 10 Units into the skin at bedtime. What changed: how much to take   TRUEplus Insulin Syringe 28G X 1/2" 1 ML Misc Generic drug: INS SYRINGE/NEEDLE 1CC/28G USE TO GIVE INSULIN FIVE TIMES DAILY        Follow-up Information     Junie Spencer, FNP. Schedule an appointment as soon as possible for a visit in 1 week(s).   Specialty: Family Medicine Why: Hospital Follow Up Contact information: [address] [place] [address] [phone]         Wendall Stade, MD. Schedule an appointment as soon as possible for a visit in 2 week(s).   Specialty: Cardiology Why: Hospital Follow Up Contact information: [address] [place] [address] [phone]                Allergies  Allergen Reactions   Shellfish Allergy Anaphylaxis, Hives and Swelling    Tongue swelling    Sulfa Antibiotics Anaphylaxis, Hives and Swelling    Tongue swelling   Ace Inhibitors Other (See Comments) and Cough    CKD, renal failure    Invokana [Canagliflozin] Other (See Comments)    Syncope Dehydration    Lexapro [Escitalopram] Other (See Comments)    Buzzing in ears Headaches "Felt like a zombie"   Metformin And Related Itching   Pravachol [Pravastatin Sodium] Other (See Comments)    Myalgias    Repatha [Evolocumab] Other (See Comments)    Myalgias Flu like symptoms    Tricor [Fenofibrate] Other (See Comments)    Myalgias     Zestril [Lisinopril] Cough   Farxiga [Dapagliflozin] Other (See Comments)    Nephro d/c'd   Crestor [Rosuvastatin] Other (See Comments)    Myalgias    Horse-Derived Products Rash    horse serum   Lipitor [Atorvastatin] Other (See Comments)    Myalgias    Livalo [Pitavastatin] Other (See Comments)    Myalgias    Milk (Cow) Diarrhea and Nausea Only    Stomach upset    Tape Rash    Allergies as of 09/13/2022       Reactions   Shellfish Allergy Anaphylaxis, Hives, Swelling  Tongue swelling   Sulfa Antibiotics Anaphylaxis, Hives, Swelling   Tongue swelling   Ace Inhibitors Other (See Comments), Cough   CKD, renal failure    Invokana [canagliflozin] Other (See Comments)   Syncope Dehydration    Lexapro [escitalopram] Other (See Comments)   Buzzing in ears Headaches "Felt like a zombie"   Metformin And Related Itching   Pravachol [pravastatin Sodium] Other (See Comments)   Myalgias    Repatha [evolocumab] Other (See Comments)   Myalgias Flu like symptoms   Tricor [fenofibrate] Other (See Comments)   Myalgias    Zestril [lisinopril] Cough   Farxiga [dapagliflozin] Other (See Comments)   Nephro d/c'd   Crestor [rosuvastatin] Other (See Comments)   Myalgias   Horse-derived Products Rash   horse serum   Lipitor [atorvastatin] Other (See Comments)   Myalgias    Livalo [pitavastatin] Other (See Comments)   Myalgias   Milk (cow) Diarrhea, Nausea Only   Stomach upset   Tape Rash        Medication List     STOP taking these medications    baclofen 10 MG tablet Commonly known as: LIORESAL   doxycycline 100 MG tablet Commonly known as: VIBRA-TABS   HumuLIN 70/30 (70-30) 100 UNIT/ML injection Generic drug: insulin NPH-regular Human       TAKE these medications    albuterol 108 (90 Base) MCG/ACT inhaler Commonly known as: VENTOLIN HFA Inhale 2 puffs into the lungs every 6 (six) hours as needed for wheezing or shortness of breath.   ALKA-SELTZER ANTACID PO Take 1 tablet by mouth 2 (two) times daily as needed (heartburn, indigestion).   BLUE-EMU MAXIMUM STRENGTH EX Apply 1 application  topically daily as needed (pain).   clopidogrel 75 MG tablet Commonly known as: PLAVIX TAKE 1 TABLET BY MOUTH DAILY   DULoxetine 60 MG capsule Commonly known as: Cymbalta Take 1 capsule (60 mg total) by mouth daily.   Eliquis 5 MG Tabs  tablet Generic drug: apixaban TAKE ONE (1) TABLET BY MOUTH TWICE DAILY What changed: See the new instructions.   ezetimibe 10 MG tablet Commonly known as: ZETIA TAKE 1 TABLET BY MOUTH DAILY   fenofibrate 160 MG tablet TAKE 1 TABLET BY MOUTH DAILY   FeroSul 325 (65 FE) MG tablet Generic drug: ferrous sulfate Take 325 mg by mouth every morning.   furosemide 40 MG tablet Commonly known as: LASIX Take 1.5 tablets (60 mg total) by mouth daily. What changed: how much to take   gabapentin 300 MG capsule Commonly known as: NEURONTIN Take 3 capsules (900 mg total) by mouth 2 (two) times daily.   HYDROcodone-acetaminophen 10-325 MG tablet Commonly known as: NORCO Take 1 tablet by mouth every 6 (six) hours as needed for moderate pain.   insulin lispro 100 UNIT/ML KwikPen Commonly known as: HUMALOG Inject 5 Units into the skin 3 (three) times daily before meals. What changed: See the new instructions.   isosorbide mononitrate 30 MG 24 hr tablet Commonly known as: IMDUR TAKE 1 TABLET BY MOUTH DAILY   metolazone 5 MG tablet Commonly known as: ZAROXOLYN Take 5 mg by mouth daily.   metoprolol succinate 50 MG 24 hr tablet Commonly known as: TOPROL-XL TAKE 1 TABLET BY MOUTH DAILY   metoprolol tartrate 50 MG tablet Commonly known as: LOPRESSOR Take 50 mg by mouth daily as needed (atrial fibrillation).   nitroGLYCERIN 0.4 MG SL tablet Commonly known as: NITROSTAT DISSOLVE ONE TABLET UNDER THE TONGUE EVERY 5 MINUTES AS NEEDED FOR CHEST  PAIN.  DO NOT EXCEED A TOTAL OF 3 DOSES IN 15 MINUTES Strength: 0.4 mg   pantoprazole 40 MG tablet Commonly known as: PROTONIX Take 1 tablet (40 mg total) by mouth daily.   tirzepatide 10 MG/0.5ML Pen Commonly known as: MOUNJARO Inject 10 mg into the skin once a week.   Evaristo Bury FlexTouch 200 UNIT/ML FlexTouch Pen Generic drug: insulin degludec Inject 10 Units into the skin at bedtime. What changed: how much to take   TRUEplus Insulin  Syringe 28G X 1/2" 1 ML Misc Generic drug: INS SYRINGE/NEEDLE 1CC/28G USE TO GIVE INSULIN FIVE TIMES DAILY        Procedures/Studies: ECHOCARDIOGRAM COMPLETE  Result Date: 09/12/2022    ECHOCARDIOGRAM REPORT   Patient Name:   [name] [name] Date of Exam: 09/12/2022 Medical Rec #:  [id number]    Height:       75.0 in Accession #:    [id number]   Weight:       294.0 lb Date of Birth:  05-01-1951    BSA:          2.585 m Patient Age:    71 years     BP:           115/65 mmHg Patient Gender: M            HR:           86 bpm. [facility] Procedure: 2D Echo, Cardiac Doppler, Color Doppler and Intracardiac            Opacification Agent Indications:    Abnormal ECG  History:        Patient has prior history of Echocardiogram examinations, most                 recent 12/19/2021. CHF, CAD, Previous Myocardial Infarction and                 Angina, Abnormal ECG, Arrythmias:Atrial Fibrillation,                 Signs/Symptoms:Chest Pain, Murmur and Bacteremia; Risk                 Factors:Hypertension, Diabetes, Dyslipidemia and Former Smoker.  Sonographer:    Mikki Harbor Referring Phys: [id number] [facility]  Sonographer Comments: Technically difficult study due to poor echo windows and patient is obese. IMPRESSIONS  1. Left ventricular ejection fraction, by estimation, is 40 to 45%. The left ventricle has mildly decreased function. The left ventricle demonstrates global hypokinesis. There is moderate left ventricular hypertrophy. Left ventricular diastolic parameters are indeterminate.  2. Right ventricular systolic function is low normal. The right ventricular size is normal.  3. Left atrial size was moderately dilated.  4. Moderate pericardial effusion. The pericardial effusion is circumferential. There is no evidence of cardiac tamponade.  5. The mitral valve is abnormal. Mild mitral valve regurgitation. No evidence of mitral stenosis.  6. The tricuspid valve is abnormal.  7. The aortic valve has  an indeterminant number of cusps. There is mild calcification of the aortic valve. There is mild thickening of the aortic valve. Aortic valve regurgitation is not visualized. No aortic stenosis is present. FINDINGS  Left Ventricle: Left ventricular ejection fraction, by estimation, is 40 to 45%. The left ventricle has mildly decreased function. The left ventricle demonstrates global hypokinesis. Definity contrast agent was given IV to delineate the left ventricular  endocardial borders. The left ventricular internal cavity size was normal in size. There is  moderate left ventricular hypertrophy. Left ventricular diastolic parameters are indeterminate. Right Ventricle: Cannot estimated PASP, IVC poorly visualized. The right ventricular size is normal. No increase in right ventricular wall thickness. Right ventricular systolic function is low normal. Left Atrium: Left atrial size was moderately dilated. Right Atrium: Right atrial size was normal in size. Pericardium: A moderately sized pericardial effusion is present. The pericardial effusion is circumferential. There is no evidence of cardiac tamponade. Mitral Valve: The mitral valve is abnormal. Mild mitral valve regurgitation. No evidence of mitral valve stenosis. MV peak gradient, 5.8 mmHg. The mean mitral valve gradient is 1.0 mmHg. Tricuspid Valve: The tricuspid valve is abnormal. Tricuspid valve regurgitation is mild . No evidence of tricuspid stenosis. Aortic Valve: The aortic valve has an indeterminant number of cusps. There is mild calcification of the aortic valve. There is mild thickening of the aortic valve. There is mild aortic valve annular calcification. Aortic valve regurgitation is not visualized. No aortic stenosis is present. Aortic valve mean gradient measures 5.0 mmHg. Aortic valve peak gradient measures 9.1 mmHg. Aortic valve area, by VTI measures 2.15 cm. Pulmonic Valve: The pulmonic valve was not well visualized. Pulmonic valve regurgitation  is not visualized. No evidence of pulmonic stenosis. Aorta: The aortic root is normal in size and structure. Venous: The inferior vena cava was not well visualized. IAS/Shunts: No atrial level shunt detected by color flow Doppler.  LEFT VENTRICLE PLAX 2D LVIDd:         5.30 cm LVIDs:         4.40 cm LV PW:         1.40 cm LV IVS:        1.40 cm LVOT diam:     2.00 cm LV SV:         60 LV SV Index:   23 LVOT Area:     3.14 cm  RIGHT VENTRICLE RV Basal diam:  4.70 cm RV Mid diam:    3.90 cm RV S prime:     10.80 cm/s TAPSE (M-mode): 1.7 cm LEFT ATRIUM              Index        RIGHT ATRIUM           Index LA diam:        4.90 cm  1.90 cm/m   RA Area:     22.90 cm LA Vol (A2C):   118.0 ml 45.65 ml/m  RA Volume:   71.40 ml  27.62 ml/m LA Vol (A4C):   103.0 ml 39.85 ml/m LA Biplane Vol: 111.0 ml 42.94 ml/m  AORTIC VALVE                     PULMONIC VALVE AV Area (Vmax):    2.18 cm      PV Vmax:       0.59 m/s AV Area (Vmean):   2.07 cm      PV Peak grad:  1.4 mmHg AV Area (VTI):     2.15 cm AV Vmax:           151.00 cm/s AV Vmean:          101.000 cm/s AV VTI:            0.277 m AV Peak Grad:      9.1 mmHg AV Mean Grad:      5.0 mmHg LVOT Vmax:         105.00 cm/s LVOT Vmean:  66.400 cm/s LVOT VTI:          0.190 m LVOT/AV VTI ratio: 0.69  AORTA Ao Root diam: 3.50 cm Ao Asc diam:  3.10 cm MITRAL VALVE               TRICUSPID VALVE MV Area (PHT): 4.86 cm    TR Peak grad:   16.2 mmHg MV Area VTI:   2.16 cm    TR Vmax:        201.00 cm/s MV Peak grad:  5.8 mmHg MV Mean grad:  1.0 mmHg    SHUNTS MV Vmax:       1.20 m/s    Systemic VTI:  0.19 m MV Vmean:      44.2 cm/s   Systemic Diam: 2.00 cm MV Decel Time: 156 msec MV E velocity: 87.00 cm/s Dina Rich MD Electronically signed by Dina Rich MD Signature Date/Time: 09/12/2022/1:26:10 PM    Final    DG Chest Portable 1 View  Result Date: 09/11/2022 CLINICAL DATA:  Weakness and shortness of breath EXAM: PORTABLE CHEST 1 VIEW COMPARISON:  X-Shooter  09/10/2022 FINDINGS: Enlarged cardiopericardial silhouette. Calcified aorta. Vascular congestion. No pneumothorax, effusion. Fixation hardware noted along the lower cervical spine at the edge of the imaging field. Left-sided loop recorder. IMPRESSION: Enlarged heart.  Vascular congestion.  Loop recorder Electronically Signed   By: Karen Kays M.D.   On: 09/11/2022 17:54   DG Chest 2 View  Result Date: 09/10/2022 CLINICAL DATA:  Shortness of breath EXAM: CHEST - 2 VIEW COMPARISON:  Chest x-[place] dated January 21, 2022 FINDINGS: Unchanged cardiomegaly. Loop recorder device. Diffuse interstitial opacities. No evidence of pleural effusion or pneumothorax. IMPRESSION: Interstitial opacities, likely due to pulmonary edema. Electronically Signed   By: Allegra Lai M.D.   On: 09/10/2022 15:38     Subjective: Pt insists on going home today.  He says he feels back to his baseline, he has been urinating most of the day and night for past 2 days.  He says he will follow up with Dr. Eden Emms and Dr. Ladona Ridgel.    Discharge Exam: Vitals:   09/13/22 0001 09/13/22 0428  BP: 116/84 112/76  Pulse: 84 93  Resp:  14  Temp: 97.9 F (36.6 C) 97.6 F (36.4 C)  SpO2: 95% 91%   Vitals:   09/12/22 1936 09/13/22 0001 09/13/22 0428 09/13/22 0457  BP: 109/81 116/84 112/76   Pulse: 98 84 93   Resp: 14  14   Temp: 97.6 F (36.4 C) 97.9 F (36.6 C) 97.6 F (36.4 C)   TempSrc: Oral Oral Oral   SpO2: 98% 95% 91%   Weight:    130.2 kg  Height:       General: Pt is alert, awake, not in acute distress Cardiovascular: normal S1/S2 +, no rubs, no gallops Respiratory: CTA bilaterally, no wheezing, no rhonchi Abdominal: Soft, NT, ND, bowel sounds + Extremities: trace pretibial edema, no cyanosis   The results of significant diagnostics from this hospitalization (including imaging, microbiology, ancillary and laboratory) are listed below for reference.     Microbiology: Recent Results (from the past 240 hour(s))   Blood culture (routine x 2)     Status: None (Preliminary result)   Collection Time: 09/11/22  7:56 PM   Specimen: Site Not Specified; Blood  Result Value Ref Range Status   Specimen Description   Final    SITE NOT SPECIFIED BOTTLES DRAWN AEROBIC AND ANAEROBIC   Special Requests Blood Culture adequate  volume  Final   Culture   Final    NO GROWTH 2 DAYS Performed at [facility], [address], [place], [place] [address]    Report Status PENDING  Incomplete  Blood culture (routine x 2)     Status: None (Preliminary result)   Collection Time: 09/11/22  7:56 PM   Specimen: Site Not Specified; Blood  Result Value Ref Range Status   Specimen Description   Final    SITE NOT SPECIFIED BOTTLES DRAWN AEROBIC AND ANAEROBIC   Special Requests Blood Culture adequate volume  Final   Culture   Final    NO GROWTH 2 DAYS Performed at [facility], [address] [place], [place] [address]    Report Status PENDING  Incomplete     Labs: BNP (last 3 results) Recent Labs    01/20/22 0129 01/22/22 1433 09/11/22 1722  BNP 267.0* 604.7* 180.0*   Basic Metabolic Panel: Recent Labs  Lab 09/10/22 1513 09/11/22 1721 09/12/22 0628 09/13/22 0420  NA 135 129* 132* 132*  K 4.6 4.0 3.8 4.4  CL 92* 89* 92* 91*  CO2 24 27 29 30   GLUCOSE 133* 128* 145* 205*  BUN 36* 41* 41* 40*  CREATININE 1.91* 1.89* 1.84* 1.86*  CALCIUM 9.6 9.6 9.2 9.3   Liver Function Tests: Recent Labs  Lab 09/10/22 1513  AST 42*  ALT 32  ALKPHOS 98  BILITOT 0.9  PROT 7.5  ALBUMIN 4.0   No results for input(s): "LIPASE", "AMYLASE" in the last 168 hours. No results for input(s): "AMMONIA" in the last 168 hours. CBC: Recent Labs  Lab 09/10/22 1513 09/11/22 1721  WBC 12.3* 14.0*  NEUTROABS 9.2*  --   HGB 10.8* 11.7*  HCT 33.9* 38.4*  MCV 84 87.7  PLT 369 463*   Cardiac Enzymes: No results for input(s): "CKTOTAL", "CKMB", "CKMBINDEX", "TROPONINI" in the last 168 hours. BNP: Invalid input(s):  "POCBNP" CBG: Recent Labs  Lab 09/12/22 0843 09/12/22 1302 09/12/22 1632 09/12/22 2052 09/13/22 0707  GLUCAP 168* 191* 189* 260* 191*   D-Dimer No results for input(s): "DDIMER" in the last 72 hours. Hgb A1c Recent Labs    09/11/22 2130  HGBA1C 9.2*   Lipid Profile No results for input(s): "CHOL", "HDL", "LDLCALC", "TRIG", "CHOLHDL", "LDLDIRECT" in the last 72 hours. Thyroid function studies No results for input(s): "TSH", "T4TOTAL", "T3FREE", "THYROIDAB" in the last 72 hours.  Invalid input(s): "FREET3" Anemia work up No results for input(s): "VITAMINB12", "FOLATE", "FERRITIN", "TIBC", "IRON", "RETICCTPCT" in the last 72 hours. Urinalysis    Component Value Date/Time   COLORURINE YELLOW 09/11/2022 1620   APPEARANCEUR CLEAR 09/11/2022 1620   APPEARANCEUR Clear 06/23/2022 1305   LABSPEC 1.008 09/11/2022 1620   PHURINE 6.0 09/11/2022 1620   GLUCOSEU NEGATIVE 09/11/2022 1620   HGBUR NEGATIVE 09/11/2022 1620   BILIRUBINUR NEGATIVE 09/11/2022 1620   BILIRUBINUR Negative 06/23/2022 1305   KETONESUR NEGATIVE 09/11/2022 1620   PROTEINUR NEGATIVE 09/11/2022 1620   UROBILINOGEN negative 08/13/2014 1725   UROBILINOGEN 0.2 08/07/2013 1747   NITRITE NEGATIVE 09/11/2022 1620   LEUKOCYTESUR NEGATIVE 09/11/2022 1620   Sepsis Labs Recent Labs  Lab 09/10/22 1513 09/11/22 1721  WBC 12.3* 14.0*   Microbiology Recent Results (from the past 240 hour(s))  Blood culture (routine x 2)     Status: None (Preliminary result)   Collection Time: 09/11/22  7:56 PM   Specimen: Site Not Specified; Blood  Result Value Ref Range Status   Specimen Description   Final  SITE NOT SPECIFIED BOTTLES DRAWN AEROBIC AND ANAEROBIC   Special Requests Blood Culture adequate volume  Final   Culture   Final    NO GROWTH 2 DAYS Performed at [facility], [address], [place], [place] [address]    Report Status PENDING  Incomplete  Blood culture (routine x 2)     Status: None (Preliminary result)    Collection Time: 09/11/22  7:56 PM   Specimen: Site Not Specified; Blood  Result Value Ref Range Status   Specimen Description   Final    SITE NOT SPECIFIED BOTTLES DRAWN AEROBIC AND ANAEROBIC   Special Requests Blood Culture adequate volume  Final   Culture   Final    NO GROWTH 2 DAYS Performed at [facility], [address] [place], [place] [address]    Report Status PENDING  Incomplete    Time coordinating discharge: 44 mins   SIGNED:  Standley Dakins, MD  [facility] 09/13/2022, 10:19 AM How to contact the [facility] Attending or Consulting provider 7A - 7P or covering provider during after hours 7P -7A, for this patient?  Check the care team in [facility] and look for a) attending/consulting [facility] provider listed and b) the [facility] team listed Log into [email or web address] and use [place]'s universal password to access. If you do not have the password, please contact the hospital operator. Locate the [facility] provider you are looking for under [facility] and page to a number that you can be directly reached. If you still have difficulty reaching the provider, please page the [facility] (Director on Call) for the Hospitalists listed on amion for assistance.

## 2022-09-13 NOTE — Progress Notes (Signed)
PRN Norco given for reported pain. Zofran given for c/o nausea. PRN Tylenol given for a reported headache. Vitals stable. Pt slept very little during the night.

## 2022-09-13 NOTE — Discharge Instructions (Signed)
IMPORTANT INFORMATION: PAY CLOSE ATTENTION  ? ?PHYSICIAN DISCHARGE INSTRUCTIONS ? ?Follow with Primary care provider  Hawks, Christy A, FNP  and other consultants as instructed by your Hospitalist Physician ? ?SEEK MEDICAL CARE OR RETURN TO EMERGENCY ROOM IF SYMPTOMS COME BACK, WORSEN OR NEW PROBLEM DEVELOPS  ? ?Please note: ?You were cared for by a hospitalist during your hospital stay. Every effort will be made to forward records to your primary care provider.  You can request that your primary care provider send for your hospital records if they have not received them.  Once you are discharged, your primary care physician will handle any further medical issues. Please note that NO REFILLS for any discharge medications will be authorized once you are discharged, as it is imperative that you return to your primary care physician (or establish a relationship with a primary care physician if you do not have one) for your post hospital discharge needs so that they can reassess your need for medications and monitor your lab values. ? ?Please get a complete blood count and chemistry panel checked by your Primary MD at your next visit, and again as instructed by your Primary MD. ? ?Get Medicines reviewed and adjusted: ?Please take all your medications with you for your next visit with your Primary MD ? ?Laboratory/radiological data: ?Please request your Primary MD to go over all hospital tests and procedure/radiological results at the follow up, please ask your primary care provider to get all Hospital records sent to his/her office. ? ?In some cases, they will be blood work, cultures and biopsy results pending at the time of your discharge. Please request that your primary care provider follow up on these results. ? ?If you are diabetic, please bring your blood sugar readings with you to your follow up appointment with primary care.   ? ?Please call and make your follow up appointments as soon as possible.   ? ?Also  Note the following: ?If you experience worsening of your admission symptoms, develop shortness of breath, life threatening emergency, suicidal or homicidal thoughts you must seek medical attention immediately by calling 911 or calling your MD immediately  if symptoms less severe. ? ?You must read complete instructions/literature along with all the possible adverse reactions/side effects for all the Medicines you take and that have been prescribed to you. Take any new Medicines after you have completely understood and accpet all the possible adverse reactions/side effects.  ? ?Do not drive when taking Pain medications or sleeping medications (Benzodiazepines) ? ?Do not take more than prescribed Pain, Sleep and Anxiety Medications. It is not advisable to combine anxiety,sleep and pain medications without talking with your primary care practitioner ? ?Special Instructions: If you have smoked or chewed Tobacco  in the last 2 yrs please stop smoking, stop any regular Alcohol  and or any Recreational drug use. ? ?Wear Seat belts while driving.  Do not drive if taking any narcotic, mind altering or controlled substances or recreational drugs or alcohol.  ? ? ? ? ? ?

## 2022-09-14 ENCOUNTER — Ambulatory Visit (INDEPENDENT_AMBULATORY_CARE_PROVIDER_SITE_OTHER): Payer: Medicare Other | Admitting: Family

## 2022-09-14 ENCOUNTER — Encounter: Payer: Self-pay | Admitting: Family

## 2022-09-14 VITALS — BP 115/63 | HR 95 | Temp 97.8°F | Wt 287.0 lb

## 2022-09-14 DIAGNOSIS — Z794 Long term (current) use of insulin: Secondary | ICD-10-CM | POA: Diagnosis not present

## 2022-09-14 DIAGNOSIS — S81809A Unspecified open wound, unspecified lower leg, initial encounter: Secondary | ICD-10-CM

## 2022-09-14 DIAGNOSIS — Z09 Encounter for follow-up examination after completed treatment for conditions other than malignant neoplasm: Secondary | ICD-10-CM | POA: Diagnosis not present

## 2022-09-14 DIAGNOSIS — N179 Acute kidney failure, unspecified: Secondary | ICD-10-CM

## 2022-09-14 DIAGNOSIS — E1169 Type 2 diabetes mellitus with other specified complication: Secondary | ICD-10-CM

## 2022-09-14 DIAGNOSIS — R0602 Shortness of breath: Secondary | ICD-10-CM | POA: Diagnosis not present

## 2022-09-14 DIAGNOSIS — S8992XA Unspecified injury of left lower leg, initial encounter: Secondary | ICD-10-CM

## 2022-09-14 DIAGNOSIS — S81801A Unspecified open wound, right lower leg, initial encounter: Secondary | ICD-10-CM | POA: Diagnosis not present

## 2022-09-14 DIAGNOSIS — I5020 Unspecified systolic (congestive) heart failure: Secondary | ICD-10-CM | POA: Diagnosis not present

## 2022-09-14 LAB — CULTURE, BLOOD (ROUTINE X 2)
Culture: NO GROWTH
Culture: NO GROWTH
Special Requests: ADEQUATE

## 2022-09-14 NOTE — Progress Notes (Signed)
Subjective:    Patient ID: [name] [name], male    DOB: Jun 29, 1950, 72 y.o.   MRN: [id number]  Chief Complaint  Patient presents with   Hospitalization Follow-up   PT presents to the office today for hospital follow up. He was seen on 09/10/22 with 16+ weight gain, SOB, and weakness. We advised him to go to ED, but refused at that time. We got his labs back and found to have AKI and elevated WBC. He decided to go to ED on 09/11/22 and discharged on 09/13/22.   He has multiple lower extremity wounds and has home health coming once a week for wound care.   He continues to have mild SOB. He is down 7 lbs since our visit on 09/11/22. He is currently taking lasix 60 mg daily.    09/14/2022    2:56 PM 09/13/2022    4:57 AM 09/11/2022    4:07 PM  Last 3 Weights  Weight (lbs) 287 lb 287 lb 0.6 oz 294 lb  Weight (kg) 130.182 kg 130.2 kg 133.358 kg     Shortness of Breath This is a chronic problem. The current episode started more than 1 year ago. The problem occurs intermittently. Associated symptoms include leg swelling and sputum production. Pertinent negatives include no sore throat.  Diabetes He presents for his follow-up diabetic visit. He has type 2 diabetes mellitus. Pertinent negatives for diabetes include no blurred vision and no foot paresthesias. Diabetic complications include heart disease. Risk factors for coronary artery disease include dyslipidemia, diabetes mellitus, hypertension, male sex and sedentary lifestyle. He is following a generally healthy diet. (Does not check BS at home)      Review of Systems  HENT:  Negative for sore throat.   Eyes:  Negative for blurred vision.  Respiratory:  Positive for sputum production and shortness of breath.   Cardiovascular:  Positive for leg swelling.  All other systems reviewed and are negative.      Objective:   Physical Exam Vitals reviewed.  Constitutional:      General: He is not in acute distress.    Appearance: He is  well-developed. He is obese.  HENT:     Head: Normocephalic.     Right Ear: Tympanic membrane normal.     Left Ear: Tympanic membrane normal.  Eyes:     General:        Right eye: No discharge.        Left eye: No discharge.     Pupils: Pupils are equal, round, and reactive to light.  Neck:     Thyroid: No thyromegaly.  Cardiovascular:     Rate and Rhythm: Normal rate and regular rhythm.     Heart sounds: Normal heart sounds. No murmur heard. Pulmonary:     Effort: Pulmonary effort is normal. No respiratory distress.     Breath sounds: Normal breath sounds. No wheezing.  Abdominal:     General: Bowel sounds are normal. There is no distension.     Palpations: Abdomen is soft.     Tenderness: There is no abdominal tenderness.  Musculoskeletal:        General: No tenderness. Normal range of motion.     Cervical back: Normal range of motion and neck supple.  Skin:    General: Skin is warm and dry.     Findings: No erythema or rash.          Comments: Scabbed lesion  Neurological:     Mental Status:  He is alert and oriented to person, place, and time.     Cranial Nerves: No cranial nerve deficit.     Deep Tendon Reflexes: Reflexes are normal and symmetric.  Psychiatric:        Behavior: Behavior normal.        Thought Content: Thought content normal.        Judgment: Judgment normal.       BP 115/63   Pulse 95   Temp 97.8 F (36.6 C) (Temporal)   Wt 287 lb (130.2 kg)   SpO2 95%   BMI 35.87 kg/m      Assessment & Plan:   [name] [name] comes in today with chief complaint of Hospitalization Follow-up   Diagnosis and orders addressed:  1. Systolic congestive heart failure, unspecified HF chronicity (HCC) - CMP14+EGFR - CBC with Differential/Platelet  2. Type 2 diabetes mellitus with other specified complication, with long-term current use of insulin (HCC) - CMP14+EGFR - CBC with Differential/Platelet  3. Open wounds involving multiple regions of lower  extremity - CMP14+EGFR - CBC with Differential/Platelet  4. Hospital discharge follow-up - CMP14+EGFR - CBC with Differential/Platelet  5. AKI (acute kidney injury) (HCC) - CMP14+EGFR - CBC with Differential/Platelet  6. SOB (shortness of breath) - CMP14+EGFR - CBC with Differential/Platelet   Labs pending Continue Lasix  60 mg for next 2-3 days then decrease to 40 mg Health Maintenance reviewed Diet and exercise encouraged  Follow up plan: 1 month  Jannifer Rodney, FNP

## 2022-09-14 NOTE — Patient Instructions (Signed)
Heart Failure, Diagnosis  Heart failure is a condition in which the heart has trouble pumping blood. This may mean that the heart cannot pump enough blood out to the body or that the heart does not fill up with enough blood. For some people with heart failure, fluid may back up into the lungs. There may also be swelling (edema) in the lower legs. Heart failure is usually a long-term (chronic) condition. It is important for you to take good care of yourself and follow the treatment plan from your health care provider. Different stages of heart failure have different treatment plans. The stages are: Stage A: At risk for heart failure. Having no symptoms of heart failure, but being at risk for developing heart failure. Stage B: Pre-heart failure. Having no symptoms of heart failure, but having structural changes to the heart that indicate heart failure. Stage C: Symptomatic heart failure. Having symptoms of heart failure in addition to structural changes to the heart that indicate heart failure. Stage D: Advanced heart failure. Having symptoms that interfere with daily life and frequent hospitalizations related to heart failure. What are the causes? This condition may be caused by: High blood pressure (hypertension). Hypertension causes the heart muscle to work harder than normal. Coronary artery disease, or CAD. CAD is the buildup of cholesterol and fat (plaque) in the arteries of the heart. Heart attack, also called myocardial infarction. This injures the heart muscle, making it hard for the heart to pump blood. Abnormal heart valves. The valves do not open and close properly, forcing the heart to pump harder to keep the blood flowing. Heart muscle disease, inflammation, or infection (cardiomyopathy or myocarditis). This is damage to the heart muscle. It can increase the risk of heart failure. Lung disease. The heart works harder when the lungs are not healthy. What increases the risk? The risk  of heart failure increases as a person ages. This condition is also more likely to develop in people who: Are obese. Use tobacco or nicotine products. Abuse alcohol or drugs. Have taken medicines that can damage the heart, such as chemotherapy drugs. Have any of these conditions: Diabetes. Abnormal heart rhythms. Thyroid problems. Low blood counts (anemia). Chronic kidney disease. Have a family history of heart failure. What are the signs or symptoms? Symptoms of this condition include: Shortness of breath with activity, such as when climbing stairs. A cough that does not go away. Swelling of the feet, ankles, legs, or abdomen. Losing or gaining weight for no reason. Trouble breathing when lying flat. Waking from sleep because of the need to sit up and get more air. Rapid heartbeat. Other symptoms may include: Tiredness (fatigue) and loss of energy. Feeling light-headed, dizzy, or close to fainting. Nausea or loss of appetite. Waking up more often during the night to urinate (nocturia). Confusion. How is this diagnosed? This condition is diagnosed based on: Your medical history, symptoms, and a physical exam. Blood tests. Diagnostic tests, which may include: Echocardiogram. Electrocardiogram (ECG). Chest X-[place]. Exercise stress test. Cardiac MRI. Cardiac catheterization and angiogram. Radionuclide scans. How is this treated? Treatment for this condition is aimed at managing the symptoms of heart failure. Medicines Treatment may include medicines that: Help lower blood pressure by relaxing (dilating) the blood vessels. These medicines are called ACE inhibitors (angiotensin-converting enzyme), ARBs (angiotensin receptor blockers), or vasodilators. Cause the kidneys to remove salt and water from the blood through urination (diuretics). Improve heart muscle strength and prevent the heart from beating too fast (beta blockers). Increase the   force of the heartbeat  (digoxin). Lower heart rates. Certain diabetes medicines (SGLT-2 inhibitors) may also be used in treatment. Healthy behavior changes Treatment may also include making healthy lifestyle changes, such as: Reaching and staying at a healthy weight. Not using tobacco or nicotine products. Eating heart-healthy foods. Limiting or avoiding alcohol. Stopping the use of illegal drugs. Being physically active. Participating in a cardiac rehabilitation program, which is a treatment program to improve your health and well-being through exercise training, education, and counseling. Other treatments Other treatments may include: Procedures to open blocked arteries or repair damaged valves. Placing a pacemaker to improve heart function (cardiac resynchronization therapy). Placing a device to treat serious abnormal heart rhythms (implantable cardioverter defibrillator, or ICD). Placing a device to improve the pumping ability of the heart (left ventricular assist device, or LVAD). Receiving a healthy heart from a donor (heart transplant). This is done when other treatments have not helped. Follow these instructions at home: Manage other health conditions as told by your health care provider. These may include hypertension, diabetes, thyroid disease, or abnormal heart rhythms. Get ongoing education and support as needed. Learn as much as you can about heart failure. Keep all follow-up visits. This is important. Where to find more information American Heart Association: www.heart.org Centers for Disease Control and Prevention: www.cdc.gov NIH National Institute on Aging: www.nia.nih.gov Summary Heart failure is a condition in which the heart has trouble pumping blood. This condition is commonly caused by high blood pressure and other diseases of the heart and lungs. Symptoms of this condition include shortness of breath, tiredness (fatigue), nausea, and swelling of the feet, ankles, legs, or  abdomen. Treatments for this condition may include medicines, lifestyle changes, and surgery. Manage other health conditions as told by your health care provider. This information is not intended to replace advice given to you by your health care provider. Make sure you discuss any questions you have with your health care provider. Document Revised: 08/12/2021 Document Reviewed: 11/25/2019 Elsevier Patient Education  2023 Elsevier Inc.  

## 2022-09-15 ENCOUNTER — Encounter: Payer: Self-pay | Admitting: *Deleted

## 2022-09-15 ENCOUNTER — Telehealth: Payer: Self-pay | Admitting: *Deleted

## 2022-09-15 ENCOUNTER — Ambulatory Visit: Payer: Medicare Other | Admitting: Family

## 2022-09-15 LAB — CMP14+EGFR
ALT: 24 IU/L (ref 0–44)
AST: 24 IU/L (ref 0–40)
Albumin/Globulin Ratio: 1.2 (ref 1.2–2.2)
Albumin: 4.1 g/dL (ref 3.8–4.8)
Alkaline Phosphatase: 102 IU/L (ref 44–121)
BUN/Creatinine Ratio: 25 — ABNORMAL HIGH (ref 10–24)
BUN: 46 mg/dL — ABNORMAL HIGH (ref 8–27)
Bilirubin Total: 0.7 mg/dL (ref 0.0–1.2)
CO2: 24 mmol/L (ref 20–29)
Calcium: 9.5 mg/dL (ref 8.6–10.2)
Chloride: 87 mmol/L — ABNORMAL LOW (ref 96–106)
Creatinine, Ser: 1.83 mg/dL — ABNORMAL HIGH (ref 0.76–1.27)
Globulin, Total: 3.3 g/dL (ref 1.5–4.5)
Glucose: 230 mg/dL — ABNORMAL HIGH (ref 70–99)
Potassium: 4.6 mmol/L (ref 3.5–5.2)
Sodium: 132 mmol/L — ABNORMAL LOW (ref 134–144)
Total Protein: 7.4 g/dL (ref 6.0–8.5)
eGFR: 39 mL/min/{1.73_m2} — ABNORMAL LOW (ref >59)

## 2022-09-15 LAB — CBC WITH DIFFERENTIAL/PLATELET
Basophils Absolute: 0.1 10*3/uL (ref 0.0–0.2)
Basos: 1 %
EOS (ABSOLUTE): 0.2 10*3/uL (ref 0.0–0.4)
Eos: 1 %
Hematocrit: 34.6 % — ABNORMAL LOW (ref 37.5–51.0)
Hemoglobin: 10.5 g/dL — ABNORMAL LOW (ref 13.0–17.7)
Immature Grans (Abs): 0.1 10*3/uL (ref 0.0–0.1)
Immature Granulocytes: 1 %
Lymphocytes Absolute: 1.5 10*3/uL (ref 0.7–3.1)
Lymphs: 11 %
MCH: 25.9 pg — ABNORMAL LOW (ref 26.6–33.0)
MCHC: 30.3 g/dL — ABNORMAL LOW (ref 31.5–35.7)
MCV: 85 fL (ref 79–97)
Monocytes Absolute: 1.1 10*3/uL — ABNORMAL HIGH (ref 0.1–0.9)
Monocytes: 9 %
Neutrophils Absolute: 10 10*3/uL — ABNORMAL HIGH (ref 1.4–7.0)
Neutrophils: 77 %
Platelets: 491 10*3/uL — ABNORMAL HIGH (ref 150–450)
RBC: 4.06 x10E6/uL — ABNORMAL LOW (ref 4.14–5.80)
RDW: 14.5 % (ref 11.6–15.4)
WBC: 13 10*3/uL — ABNORMAL HIGH (ref 3.4–10.8)

## 2022-09-15 NOTE — Transitions of Care (Post Inpatient/ED Visit) (Signed)
09/15/2022  Name: [name] [name] MRN: [id number] DOB: [birth year]  Today's TOC FU Call Status: Today's TOC FU Call Status:: Successful TOC FU Call Competed TOC FU Call Complete Date: 09/15/22  Transition Care Management Follow-up Telephone Call Date of Discharge: 09/13/22 [facility] Type of Discharge: Inpatient Admission Primary Inpatient Discharge Diagnosis:: CHF exacerbation/ increased SOB with weight gain How have you been since you were released from the hospital?: Better ("I am doing better.  Saw the doctor yesterday; I didn't weigh myself this morning, but I have been weighing around 269 lbs since I got home from the hospital. why do they tell me to eat a low sodium diet, and then tell me my sodium levels are too low?") Any questions or concerns?: Yes Patient Questions/Concerns:: Patient questions why he is told to eat a low sodium diet in setting of hyponatremia: stated, "they tell me to eat a low sodium diet, but then they tell me my sodium levels are too low.  That doesn't make sense to me" Patient Questions/Concerns Addressed: Notified Provider of Patient Questions/Concerns, Other: (made PCP and RN CM aware of patient's very valid question)  Items Reviewed: Did you receive and understand the discharge instructions provided?: Yes (thoroughly reviewed with patient who verbalizes good understanding of same) Medications obtained and verified?: Yes (Medications Reviewed) (Partial medication review completed- declined full review- done at PCP office yesterday; no concerns identified; confirmed patient obtained/ is taking Rx'd medications as instructed; self-manages medications and denies questions/ concerns today) Any new allergies since your discharge?: No Dietary orders reviewed?: Yes Type of Diet Ordered:: Heart Healthy/ low sodium Do you have support at home?: Yes People in Home: spouse Name of Support/Comfort Primary Source: Reports essentially independent in  self-care activities; supportive spouse assists as/ if needed/ indicated  Home Care and Equipment/Supplies: Were Home Health Services Ordered?: No Any new equipment or medical supplies ordered?: No  Functional Questionnaire: Do you need assistance with bathing/showering or dressing?: No Do you need assistance with meal preparation?: No Do you need assistance with eating?: No Do you have difficulty maintaining continence: No Do you need assistance with getting out of bed/getting out of a chair/moving?: Yes ("sometimes" wife assists as/ if needed) Do you have difficulty managing or taking your medications?: No  Follow up appointments reviewed: PCP Follow-up appointment confirmed?: Yes Date of PCP follow-up appointment?: 09/14/22 Follow-up Provider: PCP Specialist Hospital Follow-up appointment confirmed?: No Reason Specialist Follow-Up Not Confirmed: Patient has Specialist Provider Number and will Call for Appointment Do you need transportation to your follow-up appointment?: No Do you understand care options if your condition(s) worsen?: Yes-patient verbalized understanding  SDOH Interventions Today    Flowsheet Row Most Recent Value  SDOH Interventions   Food Insecurity Interventions Intervention Not Indicated  Transportation Interventions Intervention Not Indicated  [spouse provides transportation]      TOC Interventions Today    Flowsheet Row Most Recent Value  TOC Interventions   TOC Interventions Discussed/Reviewed TOC Interventions Discussed      Interventions Today    Flowsheet Row Most Recent Value  Chronic Disease   Chronic disease during today's visit Congestive Heart Failure (CHF)  General Interventions   General Interventions Discussed/Reviewed General Interventions Discussed, Doctor Visits, Communication with  Doctor Visits Discussed/Reviewed Doctor Visits Discussed, PCP, Specialist, Doctor Visits Reviewed  [Reviewed PCP hospital follow up visit on 09/14/22]   PCP/Specialist Visits Compliance with follow-up visit  [reminded patient to schedule with cardiology provider as instructed at time of hospital  discharge]  Communication with PCP/Specialists, RN  [Re: patient questions about low salt diet in setting of low serum sodium levels]  Education Interventions   Education Provided Provided Education  Provided Verbal Education On When to see the doctor, Other, Medication, Labs  [reinforcement of importance of daily weights in setting of CHF/ weight gain guidelines/ action plan,  diuretic dosing adjustment post PCP OV 09/14/22]  Labs Reviewed Kidney Function  [Chemistry/ sodium levels]  Nutrition Interventions   Nutrition Discussed/Reviewed Nutrition Discussed  Pharmacy Interventions   Pharmacy Dicussed/Reviewed Pharmacy Topics Discussed  [declined full medication review- stated were reviewed at PCP office visit 09/14/22: reinforced changes/ reccommendations around diuretic dosing]  Safety Interventions   Safety Discussed/Reviewed Safety Discussed  [reviewed DME: reports has/ uses regularly cane/ walker and electric wheelchair]      Caryl Pina, RN, BSN, CCRN Alumnus RN CM Care Coordination/ Transition of Care- [facility] Care Management [phone]: direct office

## 2022-09-16 ENCOUNTER — Other Ambulatory Visit: Payer: Self-pay | Admitting: Family

## 2022-09-16 LAB — CULTURE, BLOOD (ROUTINE X 2)

## 2022-09-17 ENCOUNTER — Telehealth: Payer: Self-pay

## 2022-09-17 DIAGNOSIS — I87312 Chronic venous hypertension (idiopathic) with ulcer of left lower extremity: Secondary | ICD-10-CM | POA: Diagnosis not present

## 2022-09-17 DIAGNOSIS — L97821 Non-pressure chronic ulcer of other part of left lower leg limited to breakdown of skin: Secondary | ICD-10-CM | POA: Diagnosis not present

## 2022-09-17 DIAGNOSIS — I50813 Acute on chronic right heart failure: Secondary | ICD-10-CM | POA: Diagnosis not present

## 2022-09-17 DIAGNOSIS — L97811 Non-pressure chronic ulcer of other part of right lower leg limited to breakdown of skin: Secondary | ICD-10-CM | POA: Diagnosis not present

## 2022-09-17 DIAGNOSIS — I739 Peripheral vascular disease, unspecified: Secondary | ICD-10-CM | POA: Diagnosis not present

## 2022-09-17 DIAGNOSIS — I70244 Atherosclerosis of native arteries of left leg with ulceration of heel and midfoot: Secondary | ICD-10-CM | POA: Diagnosis not present

## 2022-09-17 DIAGNOSIS — E1140 Type 2 diabetes mellitus with diabetic neuropathy, unspecified: Secondary | ICD-10-CM | POA: Diagnosis not present

## 2022-09-17 DIAGNOSIS — I87311 Chronic venous hypertension (idiopathic) with ulcer of right lower extremity: Secondary | ICD-10-CM | POA: Diagnosis not present

## 2022-09-17 DIAGNOSIS — I2510 Atherosclerotic heart disease of native coronary artery without angina pectoris: Secondary | ICD-10-CM | POA: Diagnosis not present

## 2022-09-17 DIAGNOSIS — L97521 Non-pressure chronic ulcer of other part of left foot limited to breakdown of skin: Secondary | ICD-10-CM | POA: Diagnosis not present

## 2022-09-17 NOTE — Telephone Encounter (Signed)
Tonya RN with [facility] called reporting a BG of [phone].  Called, no answer, lf vm.  Called again, instructed her to call pt's PCP to report. Confirmed understanding.

## 2022-09-22 ENCOUNTER — Other Ambulatory Visit: Payer: Self-pay

## 2022-09-22 ENCOUNTER — Telehealth: Payer: Self-pay | Admitting: Family

## 2022-09-22 ENCOUNTER — Emergency Department (HOSPITAL_BASED_OUTPATIENT_CLINIC_OR_DEPARTMENT_OTHER)
Admission: EM | Admit: 2022-09-22 | Discharge: 2022-09-22 | Discharge status: Against Medical Advice | Payer: Medicare Other | Attending: Emergency Medicine | Admitting: Emergency Medicine

## 2022-09-22 ENCOUNTER — Emergency Department (HOSPITAL_BASED_OUTPATIENT_CLINIC_OR_DEPARTMENT_OTHER): Payer: Medicare Other | Admitting: Radiology

## 2022-09-22 ENCOUNTER — Ambulatory Visit: Payer: Self-pay | Admitting: *Deleted

## 2022-09-22 DIAGNOSIS — Z7902 Long term (current) use of antithrombotics/antiplatelets: Secondary | ICD-10-CM | POA: Diagnosis not present

## 2022-09-22 DIAGNOSIS — Z7901 Long term (current) use of anticoagulants: Secondary | ICD-10-CM | POA: Insufficient documentation

## 2022-09-22 DIAGNOSIS — I110 Hypertensive heart disease with heart failure: Secondary | ICD-10-CM | POA: Insufficient documentation

## 2022-09-22 DIAGNOSIS — R790 Abnormal level of blood mineral: Secondary | ICD-10-CM | POA: Diagnosis not present

## 2022-09-22 DIAGNOSIS — R0602 Shortness of breath: Secondary | ICD-10-CM | POA: Diagnosis not present

## 2022-09-22 DIAGNOSIS — D649 Anemia, unspecified: Secondary | ICD-10-CM | POA: Diagnosis not present

## 2022-09-22 DIAGNOSIS — I509 Heart failure, unspecified: Secondary | ICD-10-CM | POA: Diagnosis not present

## 2022-09-22 DIAGNOSIS — R778 Other specified abnormalities of plasma proteins: Secondary | ICD-10-CM | POA: Insufficient documentation

## 2022-09-22 DIAGNOSIS — E1165 Type 2 diabetes mellitus with hyperglycemia: Secondary | ICD-10-CM | POA: Diagnosis not present

## 2022-09-22 DIAGNOSIS — R6510 Systemic inflammatory response syndrome (SIRS) of non-infectious origin without acute organ dysfunction: Secondary | ICD-10-CM

## 2022-09-22 DIAGNOSIS — Z794 Long term (current) use of insulin: Secondary | ICD-10-CM | POA: Insufficient documentation

## 2022-09-22 DIAGNOSIS — D72829 Elevated white blood cell count, unspecified: Secondary | ICD-10-CM | POA: Insufficient documentation

## 2022-09-22 DIAGNOSIS — T8189XA Other complications of procedures, not elsewhere classified, initial encounter: Secondary | ICD-10-CM | POA: Diagnosis not present

## 2022-09-22 DIAGNOSIS — R600 Localized edema: Secondary | ICD-10-CM | POA: Insufficient documentation

## 2022-09-22 DIAGNOSIS — Z79899 Other long term (current) drug therapy: Secondary | ICD-10-CM | POA: Diagnosis not present

## 2022-09-22 DIAGNOSIS — R944 Abnormal results of kidney function studies: Secondary | ICD-10-CM | POA: Diagnosis not present

## 2022-09-22 DIAGNOSIS — I4891 Unspecified atrial fibrillation: Secondary | ICD-10-CM | POA: Diagnosis not present

## 2022-09-22 DIAGNOSIS — R5383 Other fatigue: Secondary | ICD-10-CM

## 2022-09-22 DIAGNOSIS — R531 Weakness: Secondary | ICD-10-CM

## 2022-09-22 DIAGNOSIS — I2510 Atherosclerotic heart disease of native coronary artery without angina pectoris: Secondary | ICD-10-CM | POA: Diagnosis not present

## 2022-09-22 DIAGNOSIS — R7989 Other specified abnormal findings of blood chemistry: Secondary | ICD-10-CM

## 2022-09-22 DIAGNOSIS — T148XXD Other injury of unspecified body region, subsequent encounter: Secondary | ICD-10-CM

## 2022-09-22 LAB — BRAIN NATRIURETIC PEPTIDE: B Natriuretic Peptide: 168.1 pg/mL — ABNORMAL HIGH (ref 0.0–100.0)

## 2022-09-22 LAB — CBC WITH DIFFERENTIAL/PLATELET
Abs Immature Granulocytes: 0.14 10*3/uL — ABNORMAL HIGH (ref 0.00–0.07)
Basophils Absolute: 0.1 10*3/uL (ref 0.0–0.1)
Basophils Relative: 1 %
Eosinophils Absolute: 0.2 10*3/uL (ref 0.0–0.5)
Eosinophils Relative: 1 %
HCT: 40.3 % (ref 39.0–52.0)
Hemoglobin: 12.1 g/dL — ABNORMAL LOW (ref 13.0–17.0)
Immature Granulocytes: 1 %
Lymphocytes Relative: 13 %
Lymphs Abs: 1.9 10*3/uL (ref 0.7–4.0)
MCH: 25.7 pg — ABNORMAL LOW (ref 26.0–34.0)
MCHC: 30 g/dL (ref 30.0–36.0)
MCV: 85.6 fL (ref 80.0–100.0)
Monocytes Absolute: 1.1 10*3/uL — ABNORMAL HIGH (ref 0.1–1.0)
Monocytes Relative: 7 %
Neutro Abs: 11.9 10*3/uL — ABNORMAL HIGH (ref 1.7–7.7)
Neutrophils Relative %: 77 %
Platelets: 610 10*3/uL — ABNORMAL HIGH (ref 150–400)
RBC: 4.71 MIL/uL (ref 4.22–5.81)
RDW: 15.1 % (ref 11.5–15.5)
WBC: 15.2 10*3/uL — ABNORMAL HIGH (ref 4.0–10.5)
nRBC: 0 % (ref 0.0–0.2)

## 2022-09-22 LAB — COMPREHENSIVE METABOLIC PANEL
ALT: 13 U/L (ref 0–44)
AST: 19 U/L (ref 15–41)
Albumin: 4.2 g/dL (ref 3.5–5.0)
Alkaline Phosphatase: 73 U/L (ref 38–126)
Anion gap: 11 (ref 5–15)
BUN: 42 mg/dL — ABNORMAL HIGH (ref 8–23)
CO2: 30 mmol/L (ref 22–32)
Calcium: 10.1 mg/dL (ref 8.9–10.3)
Chloride: 89 mmol/L — ABNORMAL LOW (ref 98–111)
Creatinine, Ser: 1.97 mg/dL — ABNORMAL HIGH (ref 0.61–1.24)
GFR, Estimated: 36 mL/min — ABNORMAL LOW (ref >60)
Glucose, Bld: 186 mg/dL — ABNORMAL HIGH (ref 70–99)
Potassium: 4.3 mmol/L (ref 3.5–5.1)
Sodium: 130 mmol/L — ABNORMAL LOW (ref 135–145)
Total Bilirubin: 0.9 mg/dL (ref 0.3–1.2)
Total Protein: 8.9 g/dL — ABNORMAL HIGH (ref 6.5–8.1)

## 2022-09-22 LAB — LACTIC ACID, PLASMA: Lactic Acid, Venous: 1.6 mmol/L (ref 0.5–1.9)

## 2022-09-22 LAB — TROPONIN I (HIGH SENSITIVITY): Troponin I (High Sensitivity): 35 ng/L — ABNORMAL HIGH (ref <18)

## 2022-09-22 MED ORDER — DOXYCYCLINE HYCLATE 100 MG PO CAPS: 100.0000 mg | ORAL_CAPSULE | Freq: Two times a day (BID) | ORAL | 0 refills | Status: AC

## 2022-09-22 NOTE — ED Triage Notes (Signed)
Assisted out of car into wheelchair. Family present.   Recent admit for infection. Comes in today for increased fatigue and 'brain fog' since discharge. Reduced appetite. -N,-V. Endorses SOB, denies CP.

## 2022-09-22 NOTE — Patient Instructions (Signed)
Visit Information  Thank you for taking time to visit with me today. Please don't hesitate to contact me if I can be of assistance to you.   Following are the goals we discussed today:   Goals Addressed               This Visit's Progress     Patient Stated     COMPLETED: Fall [name] [name]) (pt-stated)   On track     Care Coordination Interventions: Assessed for falls since last encounter No falls reported since last outreach Duplicate goal closure       COMPLETED: improved uncontrolled diabetes ([facility]) (pt-stated)        Care Coordination Interventions: Duplicate goal closure Had an annual [facility] [employer] RN home visit in the last few weeks in March 2024 that re check his HgA1c to confirmed it was 8.4  Interventions Today    Flowsheet Row Most Recent Value  Chronic Disease   Chronic disease during today's visit Other, Diabetes  [falls, poor sleep]  General Interventions   General Interventions Discussed/Reviewed General Interventions Reviewed, Doctor Visits  Doctor Visits Discussed/Reviewed Doctor Visits Reviewed, PCP, Specialist  Education Interventions   Education Provided Provided Education  [06/16/22 HgA1c was 11.6 Was out of [place] & there were issues with insurance covering it]  Provided Verbal Education On Other, Blood Sugar Monitoring, Medication  [allowed him to ventilate]  Nutrition Interventions   Nutrition Discussed/Reviewed Nutrition Reviewed, Decreasing sugar intake  Safety Interventions   Safety Discussed/Reviewed Safety Reviewed, Fall Risk          COMPLETED: manage atrial fibrillation (THN) (pt-stated)        Care Coordination Interventions: Reviewed importance of adherence to anticoagulant exactly as prescribed Afib action plan reviewed Screening for signs and symptoms of depression related to chronic disease state  Assessed social determinant of health barriers Patient reports no recent episodes.  Duplicate goal closure      Other     [facility] care  coordination services   Not on track     Interventions Today    Flowsheet Row Most Recent Value  Chronic Disease   Chronic disease during today's visit Congestive Heart Failure (CHF)  General Interventions   General Interventions Discussed/Reviewed General Interventions Discussed, Doctor Visits, Communication with  [facility] care Home health, loss of interest in doing things]  Doctor Visits Discussed/Reviewed PCP, Specialist  PCP/Specialist Visits Compliance with follow-up visit  Communication with Social Work  Exercise Interventions   Exercise Discussed/Reviewed Exercise Discussed, Weight Managment, Physical Activity  Physical Activity Discussed/Reviewed Physical Activity Discussed, PREP  Weight Management Weight maintenance  Education Interventions   Education Provided Provided Education  [[facility] soical work services]  Provided Verbal Education On Mental Health/Coping with Illness, Programmer, applications  Mental Health Interventions   Mental Health Discussed/Reviewed Mental Health Discussed, Coping Strategies, Depression, Refer to Social Work for counseling  Refer to Social Work for counseling regarding Depression  Pharmacy Interventions   Pharmacy Dicussed/Reviewed Pharmacy Topics Reviewed, Affording Medications              Our next appointment is by telephone on 10/06/22 at 2 pm  Please call the care guide team at [phone] if you need to cancel or reschedule your appointment.   If you are experiencing a Mental Health or Behavioral Health Crisis or need someone to talk to, please call the Suicide and Crisis Lifeline: 988 call the [place] National Suicide Prevention Lifeline: 219-854-8320 or TTY: 314-560-3179 TTY 463-142-6857) to talk to a trained counselor  call 1-800-273-TALK (toll free, 24 hour hotline) call the [facility]: 754-015-3365 call 911   Patient verbalizes understanding of instructions and care plan provided today and agrees to view in MyChart.  Active MyChart status and patient understanding of how to access instructions and care plan via MyChart confirmed with patient.     The patient has been provided with contact information for the care management team and has been advised to call with any health related questions or concerns.   [name] Minturn L. Noelle Penner, RN, BSN, CCM [facility] Care Management Community Coordinator Office number [phone]

## 2022-09-22 NOTE — Telephone Encounter (Signed)
Called patient he is going to go to the hospital

## 2022-09-22 NOTE — Addendum Note (Signed)
Addended by: Clinton Gallant on: 09/22/2022 03:34 PM   Modules accepted: Orders

## 2022-09-22 NOTE — Telephone Encounter (Signed)
Spoke with spouse and patient. Patient seems very weak with talking. States he can't keep his eyes open only wants to sleep. Only wants to see [name] [name] given appt for Thursday. Asking if he can come in for blood work or could you do virtual today ? Do you think he should go to the hospital please advise?

## 2022-09-22 NOTE — Patient Outreach (Signed)
Care Coordination   Follow Up Visit Note   09/22/2022 Name: [name] [name] MRN: [id number] DOB: [birth year]  [name] [name] is a 72 y.o. year old male who sees [place], Edilia Bo, FNP for primary care. I spoke with  [name] [name] by phone today.  What matters to the patients health and wellness today?  EMMI red flag for 09/18/22 responses of having a lost of interest in things he used to enjoy sad, hopeless, anxious or empty   [name] [name] reports not having a good day. He is not talking nor joking as much as he generally does. Quieter and pausing before his responses. He states  "it is hard to bounce back" from his last admission for congestive Heart Failure (CHF).  He did confirm he went out to eat with his wife today He responded " is she going to give me a new body" after RN CM reviewed his 09/18/22 EMMI red flag.   Encouraged outreach to RN CM as needed  [facility] wound care nurse continues to be active   congestive Heart Failure (CHF) denies weight gain, taking medications as ordered      Goals Addressed               This Visit's Progress     Patient Stated     COMPLETED: Fall ([facility]) (pt-stated)   On track     Care Coordination Interventions: Assessed for falls since last encounter No falls reported since last outreach Duplicate goal closure       COMPLETED: improved uncontrolled diabetes (THN) (pt-stated)        Care Coordination Interventions: Duplicate goal closure Had an annual [facility] RN home visit in the last few weeks in March 2024 that re check his HgA1c to confirmed it was 8.4  Interventions Today    Flowsheet Row Most Recent Value  Chronic Disease   Chronic disease during today's visit Other, Diabetes  [falls, poor sleep]  General Interventions   General Interventions Discussed/Reviewed General Interventions Reviewed, Doctor Visits  Doctor Visits Discussed/Reviewed Doctor Visits Reviewed, PCP, Specialist  Education Interventions    Education Provided Provided Education  [06/16/22 HgA1c was 11.6 Was out of [place] & there were issues with insurance covering it]  Provided Verbal Education On Other, Blood Sugar Monitoring, Medication  [allowed him to ventilate]  Nutrition Interventions   Nutrition Discussed/Reviewed Nutrition Reviewed, Decreasing sugar intake  Safety Interventions   Safety Discussed/Reviewed Safety Reviewed, Fall Risk          COMPLETED: manage atrial fibrillation (THN) (pt-stated)        Care Coordination Interventions: Reviewed importance of adherence to anticoagulant exactly as prescribed Afib action plan reviewed Screening for signs and symptoms of depression related to chronic disease state  Assessed social determinant of health barriers Patient reports no recent episodes.  Duplicate goal closure      Other     [facility] care coordination services   Not on track     Interventions Today    Flowsheet Row Most Recent Value  Chronic Disease   Chronic disease during today's visit Congestive Heart Failure (CHF)  General Interventions   General Interventions Discussed/Reviewed General Interventions Discussed, Doctor Visits, Communication with  [facility] care Home health, loss of interest in doing things]  Doctor Visits Discussed/Reviewed PCP, Specialist  PCP/Specialist Visits Compliance with follow-up visit  Communication with Social Work  Exercise Interventions   Exercise Discussed/Reviewed Exercise Discussed, Weight Managment, Physical Activity  Physical  Activity Discussed/Reviewed Physical Activity Discussed, PREP  Weight Management Weight maintenance  Education Interventions   Education Provided Provided Education  [[facility] soical work services]  Provided Verbal Education On Mental Health/Coping with Illness, Programmer, applications  Mental Health Interventions   Mental Health Discussed/Reviewed Mental Health Discussed, Coping Strategies, Depression, Refer to Social Work for counseling  Refer to Social  Work for counseling regarding Depression  Pharmacy Interventions   Pharmacy Dicussed/Reviewed Pharmacy Topics Reviewed, Affording Medications              SDOH assessments and interventions completed:  Yes  SDOH Interventions Today    Flowsheet Row Most Recent Value  SDOH Interventions   Depression Interventions/Treatment  Medication        Care Coordination Interventions:  Yes, provided   Follow up plan: Follow up call scheduled for 10/06/22    Encounter Outcome:  Pt. Visit Completed   Anisah Kuck L. Noelle Penner, RN, BSN, CCM [facility] Care Management Community Coordinator Office number [phone]

## 2022-09-22 NOTE — ED Provider Notes (Incomplete)
[place] EMERGENCY DEPARTMENT AT [facility] Provider Note   CSN: [id number] Arrival date & time: 09/22/22  1729     History {Add pertinent medical, surgical, social history, OB history to HPI:1} Chief Complaint  Patient presents with   Fatigue    [name] [name] is a 72 y.o. male.  HPI     Home Medications Prior to Admission medications   Medication Sig Start Date End Date Taking? Authorizing Provider  albuterol (VENTOLIN HFA) 108 (90 Base) MCG/ACT inhaler Inhale 2 puffs into the lungs every 6 (six) hours as needed for wheezing or shortness of breath. 11/04/21   Delynn Flavin M, DO  apixaban (ELIQUIS) 5 MG TABS tablet TAKE ONE (1) TABLET BY MOUTH TWICE DAILY Patient taking differently: Take 5 mg by mouth 2 (two) times daily. 09/04/22   Junie Spencer, FNP  Calcium Carbonate Antacid (ALKA-SELTZER ANTACID PO) Take 1 tablet by mouth 2 (two) times daily as needed (heartburn, indigestion).    [provider]  clopidogrel (PLAVIX) 75 MG tablet TAKE 1 TABLET BY MOUTH DAILY 11/06/21   Chuck Hint, MD  DULoxetine (CYMBALTA) 60 MG capsule Take 1 capsule (60 mg total) by mouth daily. 08/04/22   Junie Spencer, FNP  ezetimibe (ZETIA) 10 MG tablet TAKE 1 TABLET BY MOUTH DAILY 04/02/22   Wendall Stade, MD  fenofibrate 160 MG tablet TAKE 1 TABLET BY MOUTH DAILY 07/02/22   Jannifer Rodney A, FNP  FEROSUL 325 (65 Fe) MG tablet Take 325 mg by mouth every morning. 05/26/22   [provider]  furosemide (LASIX) 40 MG tablet Take 1.5 tablets (60 mg total) by mouth daily. 09/13/22   Johnson, Clanford L, MD  gabapentin (NEURONTIN) 300 MG capsule Take 3 capsules (900 mg total) by mouth 2 (two) times daily. 08/04/22   Jannifer Rodney A, FNP  HUMULIN 70/30 (70-30) 100 UNIT/ML injection SMARTSIG:60 Unit(s) SUB-Q Twice Daily 09/16/22   [provider]  HYDROcodone-acetaminophen (NORCO) 10-325 MG tablet Take 1 tablet by mouth every 6 (six) hours as needed for  moderate pain. 08/11/22   Jannifer Rodney A, FNP  insulin degludec (TRESIBA FLEXTOUCH) 200 UNIT/ML FlexTouch Pen Inject 10 Units into the skin at bedtime. 09/13/22   Johnson, Clanford L, MD  insulin lispro (HUMALOG) 100 UNIT/ML KwikPen Inject 5 Units into the skin 3 (three) times daily before meals. 09/13/22   Johnson, Clanford L, MD  isosorbide mononitrate (IMDUR) 30 MG 24 hr tablet TAKE 1 TABLET BY MOUTH DAILY 12/03/21   Hawks, Neysa Bonito A, FNP  Menthol, Topical Analgesic, (BLUE-EMU MAXIMUM STRENGTH EX) Apply 1 application  topically daily as needed (pain).    [provider]  metolazone (ZAROXOLYN) 5 MG tablet Take 5 mg by mouth daily.    [provider]  metoprolol succinate (TOPROL-XL) 50 MG 24 hr tablet TAKE 1 TABLET BY MOUTH DAILY 07/31/22   Marinus Maw, MD  metoprolol tartrate (LOPRESSOR) 50 MG tablet Take 50 mg by mouth daily as needed (atrial fibrillation).    [provider]  nitroGLYCERIN (NITROSTAT) 0.4 MG SL tablet DISSOLVE ONE TABLET UNDER THE TONGUE EVERY 5 MINUTES AS NEEDED FOR CHEST PAIN.  DO NOT EXCEED A TOTAL OF 3 DOSES IN 15 MINUTES Strength: 0.4 mg 04/28/21   Wendall Stade, MD  pantoprazole (PROTONIX) 40 MG tablet Take 1 tablet (40 mg total) by mouth daily. 09/13/22   Johnson, Clanford L, MD  tirzepatide [facility]) 10 MG/0.5ML Pen Inject 10 mg into the skin once a  week. 03/16/22   Junie Spencer, FNP  TRUEPLUS INSULIN SYRINGE 28G X 1/2" 1 ML MISC USE TO GIVE INSULIN FIVE TIMES DAILY 05/12/22   Jannifer Rodney A, FNP      Allergies    Shellfish allergy, Sulfa antibiotics, Ace inhibitors, Invokana [canagliflozin], Lexapro [escitalopram], Metformin and related, Pravachol [pravastatin sodium], Repatha [evolocumab], Tricor [fenofibrate], Zestril [lisinopril], Farxiga [dapagliflozin], Crestor [rosuvastatin], Horse-derived products, Lipitor [atorvastatin], Livalo [pitavastatin], Milk (cow), and Tape    Review of Systems   Review of Systems  Physical  Exam Updated Vital Signs BP (!) 130/118 (BP Location: Right Arm)   Pulse 96   Temp (!) 96.5 F (35.8 C)   Resp (!) 22   SpO2 98%  Physical Exam  ED Results / Procedures / Treatments   Labs (all labs ordered are listed, but only abnormal results are displayed) Labs Reviewed  COMPREHENSIVE METABOLIC PANEL - Abnormal; Notable for the following components:      Result Value   Sodium 130 (*)    Chloride 89 (*)    Glucose, Bld 186 (*)    BUN 42 (*)    Creatinine, Ser 1.97 (*)    Total Protein 8.9 (*)    GFR, Estimated 36 (*)    All other components within normal limits  CBC WITH DIFFERENTIAL/PLATELET - Abnormal; Notable for the following components:   WBC 15.2 (*)    Hemoglobin 12.1 (*)    MCH 25.7 (*)    Platelets 610 (*)    Neutro Abs 11.9 (*)    Monocytes Absolute 1.1 (*)    Abs Immature Granulocytes 0.14 (*)    All other components within normal limits  LACTIC ACID, PLASMA  LACTIC ACID, PLASMA  URINALYSIS, ROUTINE W REFLEX MICROSCOPIC    EKG EKG Interpretation  Date/Time:  Tuesday Sep 22 2022 17:46:39 EDT Ventricular Rate:  102 PR Interval:    QRS Duration: 104 QT Interval:  364 QTC Calculation: 474 R Axis:   89 Text Interpretation: Atrial fibrillation Nonspecific T wave abnormality Abnormal ECG When compared with ECG of 11-Sep-2022 21:33, PREVIOUS ECG IS PRESENT Confirmed by Ernie Avena ([id number]) on 09/22/2022 7:43:40 PM  Radiology DG Chest 2 View  Result Date: 09/22/2022 CLINICAL DATA:  Shortness of breath with altered mental status and decreased appetite. EXAM: CHEST - 2 VIEW COMPARISON:  September 11, 2022 FINDINGS: There is stable mild to moderate severity enlargement of the cardiac silhouette. A radiopaque loop recorder device is seen. A coronary artery stent is also noted. There is no evidence of acute infiltrate, pleural effusion or pneumothorax. The visualized skeletal structures are unremarkable. IMPRESSION: Cardiomegaly without evidence of acute or active  cardiopulmonary disease. Electronically Signed   By: Aram Candela M.D.   On: 09/22/2022 18:41    Procedures Procedures  {Document cardiac monitor, telemetry assessment procedure when appropriate:1}  Medications Ordered in ED Medications - No data to display  ED Course/ Medical Decision Making/ A&P   {   Click here for ABCD2, HEART and other calculatorsREFRESH Note before signing :1}                          Medical Decision Making Amount and/or Complexity of Data Reviewed Labs: ordered. Radiology: ordered.   ***  {Document critical care time when appropriate:1} {Document review of labs and clinical decision tools ie heart score, Chads2Vasc2 etc:1}  {Document your independent review of radiology images, and any outside records:1} {Document your discussion with family members, caretakers,  and with consultants:1} {Document social determinants of health affecting pt's care:1} {Document your decision making why or why not admission, treatments were needed:1} Final Clinical Impression(s) / ED Diagnoses Final diagnoses:  None    Rx / DC Orders ED Discharge Orders     None

## 2022-09-22 NOTE — Patient Outreach (Signed)
  Care Coordination   09/22/2022 Name: COLTER PAUMEN MRN: [id number] DOB: 06/23/1950   Care Coordination Outreach Attempts:  An unsuccessful telephone outreach was attempted today to offer the patient information about available care coordination services.  Follow Up Plan:  Additional outreach attempts will be made to offer the patient care coordination information and services.   Encounter Outcome:  No Answer   Care Coordination Interventions:  No, not indicated    Kourtlyn Charlet L. Noelle Penner, RN, BSN, CCM [facility] Care Management Community Coordinator Office number [phone]

## 2022-09-22 NOTE — Telephone Encounter (Signed)
Pt should go to hospital. I will put labs he can get drawn today.

## 2022-09-22 NOTE — Discharge Instructions (Addendum)
You are requesting to leave AGAINST MEDICAL ADVICE.  There is concern that you are meeting criteria for systemic inflammatory response syndrome which is an immune response in the setting of systemic infection.  This can rapidly lead to life-threatening low blood pressure and cardiac arrest.  The potential source of infection is unclear and it had been recommended that you be admitted for further workup as you have chronic nonhealing wounds in your lower extremities which could develop into infection in the bones of your feet.  This is called osteomyelitis and can cause sepsis.  The treatment is IV antibiotics.  In order to fully evaluate and diagnose MRI imaging of the foot would be required.  Without completed workup, I cannot guarantee that you do not have a life or limb threatening condition.  Despite this, you have requested to leave the emergency department AGAINST MEDICAL ADVICE.  With your lower extremity wound, I will prescribe a short course of doxycycline.  Please return to the emergency department in the event of any severe worsening of your symptoms.

## 2022-09-23 ENCOUNTER — Ambulatory Visit: Payer: Medicare Other | Admitting: *Deleted

## 2022-09-23 ENCOUNTER — Encounter: Payer: Self-pay | Admitting: *Deleted

## 2022-09-23 NOTE — Patient Outreach (Signed)
Care Coordination   Initial Visit Note   09/23/2022  Name: [name] [name] MRN: [id number] DOB: July 06, 1950  [name] [name] is a 72 y.o. year old male who sees [place], Edilia Bo, FNP for primary care. I spoke with [name] [name] and wife, [name] by phone today.  What matters to the patients health and wellness today?  Obtain Counseling Resources and Cytogeneticist.   Goals Addressed               This Visit's Progress     Obtain Counseling Resources and Supportive Services. (pt-stated)   On track     Care Coordination Interventions:  Interventions Today    Flowsheet Row Most Recent Value  Chronic Disease   Chronic disease during today's visit Congestive Heart Failure (CHF), Hypertension (HTN), Diabetes, Other  [Inability to Perform Activities of Daily Living Independently, Generalized Weakness, Anxiety & Depression]  General Interventions   General Interventions Discussed/Reviewed General Interventions Discussed, Labs, Vaccines, Doctor Visits, Annual Foot Exam, Health Screening, General Interventions Reviewed, Annual Eye Exam, Durable Medical Equipment (DME), Community Resources, Level of Care, Communication with  [Encouraged]  Labs Hgb A1c every 3 months  [Encouraged]  Vaccines COVID-19, Flu, Pneumonia, RSV, Shingles, Tetanus/Pertussis/Diphtheria  [Encouraged]  Doctor Visits Discussed/Reviewed Doctor Visits Discussed, Doctor Visits Reviewed, Annual Wellness Visits, PCP, Specialist  [Encouraged]  Health Screening Bone Density, Colonoscopy, Prostate  [Encouraged]  Durable Medical Equipment (DME) Wheelchair, BP Cuff  Wheelchair Standard  PCP/Specialist Visits Compliance with follow-up visit  [Encouraged]  Communication with PCP/Specialists, RN  [Encouraged]  Level of Care Adult Daycare, Personal Care Services, Applications, Assisted Living, Skilled Nursing Facility  [Encouraged]  Applications Medicaid, Personal Care Services  [Encouraged]  Exercise Interventions   Exercise  Discussed/Reviewed Exercise Discussed, Assistive device use and maintanence, Exercise Reviewed, Physical Activity  [Encouraged]  Physical Activity Discussed/Reviewed Physical Activity Discussed, Home Exercise Program (HEP), Physical Activity Reviewed, Types of exercise  [Encouraged]  Weight Management Weight maintenance  [Encouraged]  Education Interventions   Education Provided Provided Therapist, sports, Provided Web-based Education, Provided Education  Provided Verbal Education On Nutrition, Mental Health/Coping with Illness, When to see the doctor, Foot Care, Eye Care, Labs, Blood Sugar Monitoring, Applications, Exercise, Medication, Development worker, community, [employer] Resources  [Encouraged]  Labs Reviewed Hgb A1c  [Encouraged]  Applications Medicaid, Personal Care Services  [Encouraged]  Mental Health Interventions   Mental Health Discussed/Reviewed Mental Health Discussed, Anxiety, Depression, Mental Health Reviewed, Grief and Loss, Coping Strategies, Substance Abuse, Crisis, Suicide, Other  [Domestic Violence]  Nutrition Interventions   Nutrition Discussed/Reviewed Nutrition Discussed, Adding fruits and vegetables, Increaing proteins, Decreasing fats, Decreasing salt, Nutrition Reviewed, Fluid intake, Carbohydrate meal planning, Portion sizes, Decreasing sugar intake  [Encouraged]  Pharmacy Interventions   Pharmacy Dicussed/Reviewed Pharmacy Topics Discussed, Pharmacy Topics Reviewed, Medication Adherence, Affording Medications  [Encouraged]  Safety Interventions   Safety Discussed/Reviewed Safety Discussed, Safety Reviewed, Fall Risk, Home Safety  [Encouraged]  Home Safety Assistive Devices, Need for home safety assessment, Refer for community resources  [Encouraged]  Advanced Directive Interventions   Advanced Directives Discussed/Reviewed Advanced Directives Discussed  [Advance Directives in Place]     Assessed Social Determinant of Health Barriers. Discussed Plans for Ongoing Care Management  Follow Up. Provided Careers information officer Information for Care Management Team Members. Screened for Signs & Symptoms of Depression, Related to Chronic Disease State.  PHQ2 & PHQ9 Depression Screen Completed & Results Reviewed.  Suicidal Ideation & Homicidal Ideation Assessed - None Present.   Domestic Violence Assessed - None Present.  Access to Weapons Assessed - None Present.   Active Listening & Reflection Utilized.  Verbalization of Feelings Encouraged.  Emotional Support Provided. Feelings of Caregiver Burnout Validated. Caregiver Stress Acknowledged. Caregiver Resources Reviewed. Caregiver Support Groups Mailed. Self-Enrollment in Caregiver Support Group of Interest Emphasized. Crisis Support Information, Agencies, Services & Resources Discussed. Problem Solving Interventions Identified. Task-Centered Solutions Implemented.   Solution-Focused Strategies Developed. Acceptance & Commitment Therapy Introduced. Brief Cognitive Behavioral Therapy Initiated. Client-Centered Therapy Enacted. Reviewed Prescription Medications & Discussed Importance of Compliance. Quality of Sleep Assessed & Sleep Hygiene Techniques Promoted. CSW Collaboration with Patient & Wife, Raphiel Cockayne to Discuss Higher Level of Care Options (I.e. Assisted Living, Extended Care, Skilled Nursing, Family Care, Etc.) & Encouraged Consideration. CSW Collaboration with Patient & Wife, Ezykiel Lynde to Verify No In-Home Care Services, [employer], Warden/ranger, Etc., Covered Under Firefighter through [employer].  CSW Collaboration with Patient & Wife, Kshaun Gargan to Review Insurance Provider Benefits through [employer] & Confirmed Disinterest in Applying for [employer], through [employer] of [facility] [facility] [phone]). CSW Collaboration with Patient & Wife, Antoan Lagunes to Verify No Long-Term Care Insurance Benefits,  Secondary Insurance Policies, Plans, Coverage, Etc.  CSW Collaboration with Patient & Wife, Sunao Fairweather to Confirm Neither She, Nor Patient Were Veterans, Making Them Ineligible to Apply for Aid & Attendance Benefits, Through [employer]. CSW Collaboration with Patient & Wife, Ekamveer Caramanica to Request Review of The Following List of [name], [name], [employer], Mailed on 09/23/2022: ~ Adult Day Care Programs  ~ In-Home Care & Respite Agencies ~ Home Health Care Agencies ~ Respite Care Agencies & Facilities ~ Family Caregivers ~ Theatre manager Providers ~ Home Care: Aging, Disability & Transit Services of [facility] CSW Collaboration with Patient & Wife, Dalynn Farag to Control and instrumentation engineer with [name], Services & Resources of Interest, in An Effort to Obtain Care & Supervision for Patient in The Home. CSW Collaboration with Patient & Wife, Evonte Neeb to Encourage Referral to Psychiatrist for Psychotropic Medication Administration & Management. CSW Collaboration with Patient & Wife, Greyer Marone to Encourage Referral to Therapist for Psychotherapeutic Counseling & Supportive Services. Please Review the Following List of Counseling Agencies, [employer], Mailed on 09/23/2022: ~ Psychiatrists in [place], [place] & [place] ~ [employer] in [place], [place] ~ Loneliness & Isolation: Press photographer with Patient & Wife, [name] Mcdole to Control and instrumentation engineer with [employer], Services & Resources of Interest, in An Effort to Obtain Psychiatry & Therapy. CSW Collaboration with Patient & Wife, Joao Kingman to [employer] with CSW [phone]), if They Have Questions, Need Assistance, or If Additional Social Work Needs Are Identified Between Now & Our Next Scheduled Follow-Up Outreach Call.        SDOH assessments and  interventions completed:  Yes.  SDOH Interventions Today    Flowsheet Row Most Recent Value  SDOH Interventions   Food Insecurity Interventions Intervention Not Indicated  Housing Interventions Intervention Not Indicated  Transportation Interventions Intervention Not Indicated, Patient Resources (Friends/Family)  Utilities Interventions Intervention Not Indicated  Alcohol Usage Interventions Intervention Not Indicated (Score <7)  Depression Interventions/Treatment  Referral to Psychiatry, Medication, Counseling  Financial Strain Interventions Intervention Not Indicated  Physical Activity Interventions Patient Refused  Stress Interventions Intervention Not Indicated  Social Connections Interventions Intervention Not Indicated     Care Coordination Interventions:  Yes, provided.   Follow  up plan: Follow up call scheduled for 10/07/2022 at 11:15 am.   Encounter Outcome:  Pt. Visit Completed.   Danford Bad, BSW, MSW, LCSW  Licensed Restaurant manager, [facility]  Mailing [place] [address], [place], [place] [address] Physical Address-[address] [place], [place] [address] Toll Free Main # [phone] Fax # [phone] Cell # [phone] [email or web address]

## 2022-09-23 NOTE — Patient Instructions (Signed)
Visit Information  Thank you for taking time to visit with me today. Please don't hesitate to contact me if I can be of assistance to you.   Following are the goals we discussed today:   Goals Addressed               This Visit's Progress     Obtain Counseling Resources and Supportive Services. (pt-stated)   On track     Care Coordination Interventions:  Interventions Today    Flowsheet Row Most Recent Value  Chronic Disease   Chronic disease during today's visit Congestive Heart Failure (CHF), Hypertension (HTN), Diabetes, Other  [Inability to Perform Activities of Daily Living Independently, Generalized Weakness, Anxiety & Depression]  General Interventions   General Interventions Discussed/Reviewed General Interventions Discussed, Labs, Vaccines, Doctor Visits, Annual Foot Exam, Health Screening, General Interventions Reviewed, Annual Eye Exam, Durable Medical Equipment (DME), Community Resources, Level of Care, Communication with  [Encouraged]  Labs Hgb A1c every 3 months  [Encouraged]  Vaccines COVID-19, Flu, Pneumonia, RSV, Shingles, Tetanus/Pertussis/Diphtheria  [Encouraged]  Doctor Visits Discussed/Reviewed Doctor Visits Discussed, Doctor Visits Reviewed, Annual Wellness Visits, PCP, Specialist  [Encouraged]  Health Screening Bone Density, Colonoscopy, Prostate  [Encouraged]  Durable Medical Equipment (DME) Wheelchair, BP Cuff  Wheelchair Standard  PCP/Specialist Visits Compliance with follow-up visit  [Encouraged]  Communication with PCP/Specialists, RN  [Encouraged]  Level of Care Adult Daycare, Personal Care Services, Applications, Assisted Living, Skilled Nursing Facility  [Encouraged]  Applications Medicaid, Personal Care Services  [Encouraged]  Exercise Interventions   Exercise Discussed/Reviewed Exercise Discussed, Assistive device use and maintanence, Exercise Reviewed, Physical Activity  [Encouraged]  Physical Activity Discussed/Reviewed Physical Activity  Discussed, Home Exercise Program (HEP), Physical Activity Reviewed, Types of exercise  [Encouraged]  Weight Management Weight maintenance  [Encouraged]  Education Interventions   Education Provided Provided Therapist, sports, Provided Web-based Education, Provided Education  Provided Verbal Education On Nutrition, Mental Health/Coping with Illness, When to see the doctor, Foot Care, Eye Care, Labs, Blood Sugar Monitoring, Applications, Exercise, Medication, Development worker, community, [employer] Resources  [Encouraged]  Labs Reviewed Hgb A1c  [Encouraged]  Applications Medicaid, Personal Care Services  [Encouraged]  Mental Health Interventions   Mental Health Discussed/Reviewed Mental Health Discussed, Anxiety, Depression, Mental Health Reviewed, Grief and Loss, Coping Strategies, Substance Abuse, Crisis, Suicide, Other  [Domestic Violence]  Nutrition Interventions   Nutrition Discussed/Reviewed Nutrition Discussed, Adding fruits and vegetables, Increaing proteins, Decreasing fats, Decreasing salt, Nutrition Reviewed, Fluid intake, Carbohydrate meal planning, Portion sizes, Decreasing sugar intake  [Encouraged]  Pharmacy Interventions   Pharmacy Dicussed/Reviewed Pharmacy Topics Discussed, Pharmacy Topics Reviewed, Medication Adherence, Affording Medications  [Encouraged]  Safety Interventions   Safety Discussed/Reviewed Safety Discussed, Safety Reviewed, Fall Risk, Home Safety  [Encouraged]  Home Safety Assistive Devices, Need for home safety assessment, Refer for community resources  [Encouraged]  Advanced Directive Interventions   Advanced Directives Discussed/Reviewed Advanced Directives Discussed  [Advance Directives in Place]     Assessed Social Determinant of Health Barriers. Discussed Plans for Ongoing Care Management Follow Up. Provided Careers information officer Information for Care Management Team Members. Screened for Signs & Symptoms of Depression, Related to Chronic Disease State.  PHQ2 & PHQ9  Depression Screen Completed & Results Reviewed.  Suicidal Ideation & Homicidal Ideation Assessed - None Present.   Domestic Violence Assessed - None Present. Access to Weapons Assessed - None Present.   Active Listening & Reflection Utilized.  Verbalization of Feelings Encouraged.  Emotional Support Provided. Feelings of Caregiver Burnout Validated. Caregiver Stress Acknowledged. Caregiver Resources  Reviewed. Caregiver Support Groups Mailed. Self-Enrollment in Caregiver Support Group of Interest Emphasized. Crisis Support Information, Agencies, Services & Resources Discussed. Problem Solving Interventions Identified. Task-Centered Solutions Implemented.   Solution-Focused Strategies Developed. Acceptance & Commitment Therapy Introduced. Brief Cognitive Behavioral Therapy Initiated. Client-Centered Therapy Enacted. Reviewed Prescription Medications & Discussed Importance of Compliance. Quality of Sleep Assessed & Sleep Hygiene Techniques Promoted. CSW Collaboration with Patient & Wife, Noahjames Bayliss to Discuss Higher Level of Care Options (I.e. Assisted Living, Extended Care, Skilled Nursing, Family Care, Etc.) & Encouraged Consideration. CSW Collaboration with Patient & Wife, Rhody Musleh to Verify No In-Home Care Services, [employer], Warden/ranger, Etc., Covered Under Firefighter through [employer].  CSW Collaboration with Patient & Wife, Axel Luikart to Review Insurance Provider Benefits through [employer] & Confirmed Disinterest in Applying for [employer], through [employer] of [facility] [phone]). CSW Collaboration with Patient & Wife, Severiano Willinger to Verify No Long-Term Care Insurance Benefits, Secondary Insurance Policies, Plans, Coverage, Etc.  CSW Collaboration with Patient & Wife, Imad Kris to Confirm Neither She, Nor Patient Were Veterans, Making Them Ineligible  to Apply for Aid & Attendance Benefits, Through [employer]. CSW Collaboration with Patient & Wife, Namari Doswell to Request Review of The Following List of [name], [name], [employer], Mailed on 09/23/2022: ~ Adult Day Care Programs  ~ In-Home Care & Respite Agencies ~ Home Health Care Agencies ~ Respite Care Agencies & Facilities ~ Family Caregivers ~ Theatre manager Providers ~ Home Care: Aging, Disability & Transit Services of [facility] CSW Collaboration with Patient & Wife, Jermie Schreib to Control and instrumentation engineer with [name], Services & Resources of Interest, in An Effort to Obtain Care & Supervision for Patient in The Home. CSW Collaboration with Patient & Wife, Jesie Wallock to Encourage Referral to Psychiatrist for Psychotropic Medication Administration & Management. CSW Collaboration with Patient & Wife, Nimai Byars to Encourage Referral to Therapist for Psychotherapeutic Counseling & Supportive Services. Please Review the Following List of Counseling Agencies, [employer], Mailed on 09/23/2022: ~ Psychiatrists in [place], [place] & [place] ~ [employer] in [place], [place] ~ Loneliness & Isolation: Press photographer with Patient & Wife, Loy Marett to Control and instrumentation engineer with [employer], Services & Resources of Interest, in An Effort to Obtain Psychiatry & Therapy. CSW Collaboration with Patient & Wife, Geancarlo Bermudes to [employer] with CSW [phone]), if They Have Questions, Need Assistance, or If Additional Social Work Needs Are Identified Between Now & Our Next Scheduled Follow-Up Outreach Call.      Our next appointment is by telephone on 10/07/2022 at 11:15 am.   Please call the care guide team at [phone] if you need to cancel or reschedule your appointment.   If you are experiencing a Mental Health or  Behavioral Health Crisis or need someone to talk to, please call the Suicide and Crisis Lifeline: 988 call the [place] National Suicide Prevention Lifeline: 508-865-1549 or TTY: 520-521-4664 TTY 343-808-8038) to talk to a trained counselor call 1-800-273-TALK (toll free, 24 hour hotline) go to [facility] [address], [place] [phone]) call the [facility] Crisis Line: 3081210016 call 911  Patient verbalizes understanding of instructions and care plan provided today and agrees to view in MyChart. Active MyChart status and patient understanding of how to access instructions and care plan via MyChart confirmed with patient.  Telephone follow up appointment with care management team member scheduled for:  10/07/2022 at 11:15 am.   Danford Bad, BSW, MSW, LCSW  Licensed Clinical Social Worker  [employer] [facility]  Mailing [place]. [address] [place], [place] [address] Physical Address-[address] [place], [place] [address] Toll Free Main # [phone] Fax # [phone] Cell # [phone] [email or web address]

## 2022-09-24 ENCOUNTER — Encounter: Payer: Self-pay | Admitting: Family

## 2022-09-24 ENCOUNTER — Ambulatory Visit (INDEPENDENT_AMBULATORY_CARE_PROVIDER_SITE_OTHER): Payer: Medicare Other | Admitting: Family

## 2022-09-24 VITALS — BP 139/76 | HR 72 | Temp 97.4°F | Ht 75.0 in | Wt 274.0 lb

## 2022-09-24 DIAGNOSIS — I87312 Chronic venous hypertension (idiopathic) with ulcer of left lower extremity: Secondary | ICD-10-CM | POA: Diagnosis not present

## 2022-09-24 DIAGNOSIS — R6510 Systemic inflammatory response syndrome (SIRS) of non-infectious origin without acute organ dysfunction: Secondary | ICD-10-CM | POA: Diagnosis not present

## 2022-09-24 DIAGNOSIS — Z09 Encounter for follow-up examination after completed treatment for conditions other than malignant neoplasm: Secondary | ICD-10-CM | POA: Diagnosis not present

## 2022-09-24 DIAGNOSIS — I779 Disorder of arteries and arterioles, unspecified: Secondary | ICD-10-CM | POA: Diagnosis not present

## 2022-09-24 DIAGNOSIS — I87311 Chronic venous hypertension (idiopathic) with ulcer of right lower extremity: Secondary | ICD-10-CM | POA: Diagnosis not present

## 2022-09-24 DIAGNOSIS — L97811 Non-pressure chronic ulcer of other part of right lower leg limited to breakdown of skin: Secondary | ICD-10-CM | POA: Diagnosis not present

## 2022-09-24 DIAGNOSIS — Z794 Long term (current) use of insulin: Secondary | ICD-10-CM

## 2022-09-24 DIAGNOSIS — I2510 Atherosclerotic heart disease of native coronary artery without angina pectoris: Secondary | ICD-10-CM | POA: Diagnosis not present

## 2022-09-24 DIAGNOSIS — S81802D Unspecified open wound, left lower leg, subsequent encounter: Secondary | ICD-10-CM

## 2022-09-24 DIAGNOSIS — L97821 Non-pressure chronic ulcer of other part of left lower leg limited to breakdown of skin: Secondary | ICD-10-CM | POA: Diagnosis not present

## 2022-09-24 DIAGNOSIS — K5900 Constipation, unspecified: Secondary | ICD-10-CM | POA: Diagnosis not present

## 2022-09-24 DIAGNOSIS — L97521 Non-pressure chronic ulcer of other part of left foot limited to breakdown of skin: Secondary | ICD-10-CM | POA: Diagnosis not present

## 2022-09-24 DIAGNOSIS — I50813 Acute on chronic right heart failure: Secondary | ICD-10-CM | POA: Diagnosis not present

## 2022-09-24 DIAGNOSIS — I739 Peripheral vascular disease, unspecified: Secondary | ICD-10-CM | POA: Diagnosis not present

## 2022-09-24 DIAGNOSIS — E1140 Type 2 diabetes mellitus with diabetic neuropathy, unspecified: Secondary | ICD-10-CM | POA: Diagnosis not present

## 2022-09-24 DIAGNOSIS — I70244 Atherosclerosis of native arteries of left leg with ulceration of heel and midfoot: Secondary | ICD-10-CM | POA: Diagnosis not present

## 2022-09-24 LAB — CBC WITH DIFFERENTIAL/PLATELET
Basophils Absolute: 0.1 10*3/uL (ref 0.0–0.2)
Basos: 1 %
EOS (ABSOLUTE): 0.2 10*3/uL (ref 0.0–0.4)
Eos: 1 %
Hematocrit: 40.8 % (ref 37.5–51.0)
Hemoglobin: 12.5 g/dL — ABNORMAL LOW (ref 13.0–17.7)
Immature Grans (Abs): 0.1 10*3/uL (ref 0.0–0.1)
Immature Granulocytes: 1 %
Lymphocytes Absolute: 1.4 10*3/uL (ref 0.7–3.1)
Lymphs: 11 %
MCH: 26.2 pg — ABNORMAL LOW (ref 26.6–33.0)
MCHC: 30.6 g/dL — ABNORMAL LOW (ref 31.5–35.7)
MCV: 85 fL (ref 79–97)
Monocytes Absolute: 0.8 10*3/uL (ref 0.1–0.9)
Monocytes: 7 %
Neutrophils Absolute: 10.2 10*3/uL — ABNORMAL HIGH (ref 1.4–7.0)
Neutrophils: 79 %
Platelets: 578 10*3/uL — ABNORMAL HIGH (ref 150–450)
RBC: 4.78 x10E6/uL (ref 4.14–5.80)
RDW: 14.1 % (ref 11.6–15.4)
WBC: 12.8 10*3/uL — ABNORMAL HIGH (ref 3.4–10.8)

## 2022-09-24 LAB — CMP14+EGFR
ALT: 14 IU/L (ref 0–44)
AST: 23 IU/L (ref 0–40)
Albumin/Globulin Ratio: 1.1 — ABNORMAL LOW (ref 1.2–2.2)
Albumin: 4.2 g/dL (ref 3.8–4.8)
Alkaline Phosphatase: 92 IU/L (ref 44–121)
BUN/Creatinine Ratio: 23 (ref 10–24)
BUN: 44 mg/dL — ABNORMAL HIGH (ref 8–27)
Bilirubin Total: 0.9 mg/dL (ref 0.0–1.2)
CO2: 24 mmol/L (ref 20–29)
Calcium: 9.8 mg/dL (ref 8.6–10.2)
Chloride: 90 mmol/L — ABNORMAL LOW (ref 96–106)
Creatinine, Ser: 1.91 mg/dL — ABNORMAL HIGH (ref 0.76–1.27)
Globulin, Total: 3.7 g/dL (ref 1.5–4.5)
Glucose: 234 mg/dL — ABNORMAL HIGH (ref 70–99)
Potassium: 4.8 mmol/L (ref 3.5–5.2)
Sodium: 133 mmol/L — ABNORMAL LOW (ref 134–144)
Total Protein: 7.9 g/dL (ref 6.0–8.5)
eGFR: 37 mL/min/{1.73_m2} — ABNORMAL LOW (ref >59)

## 2022-09-24 MED ORDER — LINACLOTIDE 145 MCG PO CAPS
145.0000 ug | ORAL_CAPSULE | Freq: Every day | ORAL | 1 refills | Status: DC
Start: 2022-09-24 — End: 2023-02-02

## 2022-09-24 NOTE — Patient Instructions (Signed)
Peripheral Vascular Disease  Peripheral vascular disease (PVD) is a disease of the blood vessels. PVD may also be called peripheral artery disease (PAD) or poor circulation. PVD is the blocking or hardening of the arteries anywhere within the circulatory system beyond the heart. This can result in a decreased supply of blood to the arms, legs, and internal organs, such as the stomach or kidneys. However, PVD most often affects a person's lower legs and feet. Without treatment, PVD often worsens. PVD can lead to acute limb ischemia. This occurs when an arm or leg suddenly has trouble getting enough blood. This is a medical emergency. What are the causes? The most common cause of PVD is atherosclerosis. This is a buildup of fatty material and other substances (plaque)inside your arteries. Pieces of plaque can break off from the walls of an artery and become stuck in a smaller artery, blocking blood flow and possibly causing acute limb ischemia. Other common causes of PVD include: Blood clots that form inside the blood vessels. Injuries to blood vessels. Diseases that cause inflammation of blood vessels or cause blood vessel tightening (spasms). What increases the risk? The following factors may make you more likely to develop this condition: A family history of PVD. Common medical conditions, including: High cholesterol. Diabetes. High blood pressure (hypertension). Heart disease. Known atherosclerotic disease in another area of the body. Past injury, such as burns or a broken bone. Other medical conditions, such as: Buerger's disease. This is caused by inflamed blood vessels in your hands and feet. Some forms of arthritis. Birth defects that affect the arteries in your legs. Kidney disease. Using tobacco and nicotine products. Not getting enough exercise. Obesity. Being age 65 or older, or being age 50 or older and having the other risk factors. What are the signs or symptoms? This  condition may cause different symptoms. Your symptoms depend on what body part is not getting enough blood. Common signs and symptoms include: Cramps in your buttocks, legs, and feet. Intermittent claudication. This is pain and weakness in your legs during activity that resolves with rest. Leg pain at rest and leg numbness, tingling, or weakness. Coldness in a leg or foot, especially when compared to the other leg or foot. Skin or hair changes. These can include: Hair loss. Shiny skin. Pale or bluish skin. Thick toenails. Inability to get or maintain an erection (erectile dysfunction). Tiredness (fatigue). Weak pulse or no pulse in the feet. People with PVD are more likely to develop open wounds (ulcers) and sores on their toes, feet, or legs. The ulcers or sores may take longer than normal to heal. How is this diagnosed? PVD is diagnosed based on your signs and symptoms, a physical exam, and your medical history. You may also have other tests to find the cause. Tests include: Ankle-brachial index test.This test compares the blood pressure readings of the legs and arms. This may also include an exercise ankle-brachial index test in which you walk on a treadmill to check your symptoms. Doppler ultrasound. This takes pictures of blood flow through your blood vessels. Imaging studies that use dye to show blood flow. These are: CT angiogram. Magnetic resonance angiogram, or MRA. How is this treated? Treatment for PVD depends on the cause of your condition, how severe your symptoms are, and your age. Underlying causes need to be treated and controlled. These include long-term (chronic) conditions, such as diabetes, high cholesterol, and hypertension. Treatment may include: Lifestyle changes, such as: Quitting tobacco use. Exercising regularly. Following a   low-fat, low-cholesterol diet. Not drinking alcohol. Taking medicines, such as: Blood thinners to prevent blood clots. Medicines to  improve blood flow. Medicines to improve cholesterol levels. Procedures, such as: Angioplasty. This uses an inflated balloon to open a blocked artery and improve blood flow. Stent implant. This inserts a small mesh tube to keep a blocked artery open. Peripheral bypass surgery. This reroutes blood flow around a blocked artery. Surgery to remove dead tissue from an infected wound (debridement). Amputation. This is surgical removal of the affected limb. It may be necessary in cases of acute limb ischemia when medical or surgical treatments have not helped. Follow these instructions at home: Medicines Take over-the-counter and prescription medicines only as told by your health care provider. If you are taking blood thinners: Talk with your health care provider before you take any medicines that contain aspirin or NSAIDs, such as ibuprofen. These medicines increase your risk for dangerous bleeding. Take your medicine exactly as told, at the same time every day. Avoid activities that could cause injury or bruising, and follow instructions about how to prevent falls. Wear a medical alert bracelet or carry a card that lists what medicines you take. Lifestyle     Exercise regularly. Ask your health care provider about some good activities for you. Talk with your health care provider about maintaining a healthy weight. If needed, ask about losing weight. Eat a diet that is low in fat and cholesterol. If you need help, talk with your health care provider. Do not drink alcohol. Do not use any products that contain nicotine or tobacco. These products include cigarettes, chewing tobacco, and vaping devices, such as e-cigarettes. If you need help quitting, ask your health care provider. General instructions Take good care of your feet. To do this: Wear comfortable shoes that fit well. Check your feet often for any cuts or sores. Get an annual influenza vaccine. Keep all follow-up visits. This is  important. Where to find more information Society for Vascular Surgery: vascular.org American Heart Association: heart.org National Heart, Lung, and Blood Institute: nhlbi.nih.gov Contact a health care provider if: You have leg cramps while walking. You have leg pain when you rest. Your leg or foot feels cold. Your skin changes color. You have erectile dysfunction. You have cuts or sores on your legs or feet that do not heal. Get help right away if: You have sudden changes in color and feeling of your arms or legs, such as: Your arm or leg turns cold, numb, and blue. Your arm or leg becomes red, warm, swollen, painful, or numb. You have any symptoms of a stroke. "BE FAST" is an easy way to remember the main warning signs of a stroke: B - Balance. Signs are dizziness, sudden trouble walking, or loss of balance. E - Eyes. Signs are trouble seeing or a sudden change in vision. F - Face. Signs are sudden weakness or numbness of the face, or the face or eyelid drooping on one side. A - Arms. Signs are weakness or numbness in an arm. This happens suddenly and usually on one side of the body. S - Speech. Signs are sudden trouble speaking, slurred speech, or trouble understanding what people say. T - Time. Time to call emergency services. Write down what time symptoms started. You have other signs of a stroke, such as: A sudden, severe headache with no known cause. Nausea or vomiting. Seizure. You have chest pain or trouble breathing. These symptoms may represent a serious problem that is an emergency.   Do not wait to see if the symptoms will go away. Get medical help right away. Call your local emergency services (911 in the [place] Do not drive yourself to the hospital. Summary Peripheral vascular disease (PVD) is a disease of the blood vessels. PVD is the blocking or hardening of the arteries anywhere within the circulatory system beyond the heart. PVD may cause different symptoms. Your  symptoms depend on what part of your body is not getting enough blood. Treatment for PVD depends on what caused it, how severe your symptoms are, and your age. This information is not intended to replace advice given to you by your health care provider. Make sure you discuss any questions you have with your health care provider. Document Revised: 11/06/2019 Document Reviewed: 11/06/2019 Elsevier Patient Education  2023 Elsevier Inc.  

## 2022-09-24 NOTE — Progress Notes (Signed)
Subjective:    Patient ID: [name] [name], male    DOB: [birth year], 72 y.o.   MRN: [id number]  Chief Complaint  Patient presents with   Hospitalization Follow-up   Pt presents to the office today for hospital follow up. He went to the ED on 09/22/22 for weakness and fatigue. He was diagnosed with SIRS and started on doxycyline. They wanted to admit him, but pt did not wanted to be admitted and left AMA.   He has multiple wounds on his legs. Has PAD.   He is feeling better today..   Constipation This is a chronic problem. The current episode started more than 1 year ago. His stool frequency is 2 to 3 times per week. He has tried laxatives (linzess) for the symptoms. The treatment provided moderate relief.      Review of Systems  Gastrointestinal:  Positive for constipation.  All other systems reviewed and are negative.      Objective:   Physical Exam Vitals reviewed.  Constitutional:      General: He is not in acute distress.    Appearance: He is well-developed.  HENT:     Head: Normocephalic.     Right Ear: Tympanic membrane normal.     Left Ear: Tympanic membrane normal.  Eyes:     General:        Right eye: No discharge.        Left eye: No discharge.     Pupils: Pupils are equal, round, and reactive to light.  Neck:     Thyroid: No thyromegaly.  Cardiovascular:     Rate and Rhythm: Normal rate and regular rhythm.     Heart sounds: Normal heart sounds. No murmur heard. Pulmonary:     Effort: Pulmonary effort is normal. No respiratory distress.     Breath sounds: Normal breath sounds. No wheezing.  Abdominal:     General: Bowel sounds are normal. There is no distension.     Palpations: Abdomen is soft.     Tenderness: There is no abdominal tenderness.  Musculoskeletal:        General: No tenderness.     Cervical back: Normal range of motion and neck supple.  Skin:    General: Skin is warm and dry.     Findings: No erythema or rash.  Neurological:     Mental  Status: He is alert and oriented to person, place, and time.     Cranial Nerves: No cranial nerve deficit.     Motor: Weakness present.     Deep Tendon Reflexes: Reflexes are normal and symmetric.  Psychiatric:        Behavior: Behavior normal.        Thought Content: Thought content normal.        Judgment: Judgment normal.       BP 139/76   Pulse 72   Temp (!) 97.4 F (36.3 C) (Temporal)   Ht 6\' 3"  (1.905 m)   Wt 274 lb (124.3 kg)   SpO2 96%   BMI 34.25 kg/m      Assessment & Plan:  [name] [name] comes in today with chief complaint of Hospitalization Follow-up   Diagnosis and orders addressed:  1. PAOD (peripheral arterial occlusive disease) (HCC) - CMP14+EGFR - CBC with Differential/Platelet  2. SIRS (systemic inflammatory response syndrome) (HCC) - CMP14+EGFR - CBC with Differential/Platelet  3. Constipation, unspecified constipation type - linaclotide (LINZESS) 145 MCG CAPS capsule; Take 1 capsule (145 mcg total) by  mouth daily before breakfast.  Dispense: 30 capsule; Refill: 1 - CMP14+EGFR - CBC with Differential/Platelet  4. Hospital discharge follow-up - CMP14+EGFR - CBC with Differential/Platelet  5. Wound of left lower extremity, subsequent encounter - CMP14+EGFR - CBC with Differential/Platelet   Labs pending Continue doxycyline PT looks the best, that I have seen him in the last two weeks.  Continue with wound care and dressing changes Continue current medications  States he is ready to "go" whenever and does not want to go to hospital ever again.  Health Maintenance reviewed Diet and exercise encouraged  Follow up plan: 1 month   Jannifer Rodney, FNP

## 2022-09-25 ENCOUNTER — Ambulatory Visit: Payer: Medicare Other | Attending: Nurse Practitioner | Admitting: Student

## 2022-09-25 ENCOUNTER — Encounter: Payer: Self-pay | Admitting: Student

## 2022-09-25 ENCOUNTER — Other Ambulatory Visit: Payer: Self-pay | Admitting: Family

## 2022-09-25 VITALS — BP 108/62 | HR 66 | Ht 75.0 in | Wt 273.0 lb

## 2022-09-25 DIAGNOSIS — I739 Peripheral vascular disease, unspecified: Secondary | ICD-10-CM | POA: Diagnosis not present

## 2022-09-25 DIAGNOSIS — E785 Hyperlipidemia, unspecified: Secondary | ICD-10-CM | POA: Diagnosis not present

## 2022-09-25 DIAGNOSIS — I5022 Chronic systolic (congestive) heart failure: Secondary | ICD-10-CM | POA: Diagnosis not present

## 2022-09-25 DIAGNOSIS — I480 Paroxysmal atrial fibrillation: Secondary | ICD-10-CM | POA: Diagnosis not present

## 2022-09-25 DIAGNOSIS — N1832 Chronic kidney disease, stage 3b: Secondary | ICD-10-CM

## 2022-09-25 DIAGNOSIS — I2510 Atherosclerotic heart disease of native coronary artery without angina pectoris: Secondary | ICD-10-CM

## 2022-09-25 MED ORDER — FUROSEMIDE 40 MG PO TABS: 40.0000 mg | ORAL_TABLET | Freq: Every day | ORAL | 3 refills | Status: AC

## 2022-09-25 NOTE — Patient Instructions (Signed)
Medication Instructions:  Take Lasix 40 mg daily, may take an extra 20 mgh (1/2 tablet) daily as needed for weight gain or leg swelling.  Labwork: None today  Testing/Procedures: None today  Follow-Up: 3-4 months Dr.Nishan  Any Other Special Instructions Will Be Listed Below (If Applicable).  If you need a refill on your cardiac medications before your next appointment, please call your pharmacy.

## 2022-09-25 NOTE — Progress Notes (Signed)
Cardiology Office Note    Date:  09/25/2022  ID:  [name], DOB 21-Jun-1950, MRN [id number] Cardiologist: Charlton Haws, MD   EP: Dr. Ladona Ridgel  History of Present Illness:    ANDIE [name] is a 72 y.o. male with past medical history of CAD (s/p DES to RCA in 2010, cath in 07/2016 showing CTO of RCA with collaterals, did receive DESx2 to LCx and OM1, repeat cath in 01/2018 showing patent stents along LCx and OM with CTO of D2, CTO of distal LCx, and CTO of RCA with collaterals present overall unchanged since 2018, cath in 05/2020 with restenosis of proximal LCx and OM1 treated with 2 overlapping DES, cath in 07/2021 showing no options for PCI and medical management recommended), paroxysmal atrial fibrillation, carotid artery stenosis, PAD (s/p right SFA stenting in 03/2019 and left fem-pop in 11/2020), HTN, HLD (intolerant to statins and Repatha), HFmrEF (EF 45-50% in 12/2021), mitral regurgitation (moderate to severe by TEE in 01/2022), Stage 3 CKD, IDDM, and prior CVA who presents to the office today for overdue follow-up.  He was examined by myself in 01/2022 following a recent admission for sepsis in the setting of group B bacteremia. He reported overall feeling well the time of his office visit and was continued on his current cardiac medications. He did have scheduled follow-up with Dr. Eden Emms the following month but it appears he canceled this. He did see Dr. Ladona Ridgel in 06/2022 for follow-up of his atrial fibrillation and remained off Amiodarone at that time.  In the interim, he was recently admitted to [facility] from 4/26 - 4/28 for evaluation of worsening dyspnea on exertion and lower extremity edema in the setting of a 20 pound weight gain. BNP was mildly elevated at 180 and he was admitted for an acute CHF exacerbation.  Repeat echo did show his EF was further reduced at 40 to 45% with global hypokinesis and a moderate pericardial effusion. Was also found to meet SIRS criteria in the setting  of multiple open wounds along his lower extremities and was started on IV antibiotics. He insisted on going home on 09/13/2022 and was discharged on Plavix 75 mg daily, Eliquis 5 mg twice daily, Zetia 10 mg daily, Lasix 60 mg daily, Imdur 30 mg daily, Metolazone 5 mg as needed and Toprol-XL 50 mg daily from a cardiac perspective. Weight at discharge was 287 lbs (previously 294 lbs on admission). He presented back to the ED on 09/22/2022 and was again found to meet SIRS criteria in the setting of lower extremity wounds and possible cellulitis. Admission was advised but he left AMA.  In talking with the patient and his wife today, he reports things have overall been stable since his recent hospitalization. His lower extremity wounds are being followed by home health. Reports his breathing has been stable with no significant orthopnea or PND. He does experience intermittent lower extremity edema but says his weight has declined over 20 pounds since his hospitalization and he feels like he is close to his baseline weight. He does weigh himself at home and this is at 273 lbs on his home scales and office scales. He denies any exertional chest pain. Reports occasional palpitations when he is having more pain along his legs. Remains on Eliquis for anticoagulation with no reports of active bleeding.  Studies Reviewed:   EKG: EKG is not ordered today. EKG from 09/22/2022 is reviewed and demonstrates atrial fibrillation with slightly elevated HR, 102. No acute ST abnormalities.  LHC: 07/2021  Prox LAD lesion is 40% stenosed.   Mid Cx to Dist Cx lesion is 100% stenosed.   Prox RCA to Mid RCA lesion is 90% stenosed.   Prox RCA lesion is 99% stenosed.   Ost 1st Diag lesion is 40% stenosed.   Ost 2nd Diag to 2nd Diag lesion is 100% stenosed.   Previously placed Ost 1st Mrg stent (unknown type) is  widely patent.   Previously placed Prox Cx stent (unknown type) is  widely patent.   1. Moderate mid LAD stenosis  unchanged. Chronic occlusion Diagonal 1. Severe disease small diagonal 2.  2. Patent Circumflex/OM stents. Chronic occlusion of the mid Circumflex beyond the takeoff of the obtuse marginal branch. This branch fills from left to left collaterals.  3. Chronic occlusion mid RCA in old stented segment. The distal vessel fills briskly from left to right collaterals.  4. LVEDP=28 mmHg   Recommendations: Continue medical management of CAD. Ok to proceed with planned vascular surgery. Resume Eliquis tomorrow. I will ask him to begin lasix 40 mg daily.    Echocardiogram: 08/2022 IMPRESSIONS     1. Left ventricular ejection fraction, by estimation, is 40 to 45%. The  left ventricle has mildly decreased function. The left ventricle  demonstrates global hypokinesis. There is moderate left ventricular  hypertrophy. Left ventricular diastolic  parameters are indeterminate.   2. Right ventricular systolic function is low normal. The right  ventricular size is normal.   3. Left atrial size was moderately dilated.   4. Moderate pericardial effusion. The pericardial effusion is  circumferential. There is no evidence of cardiac tamponade.   5. The mitral valve is abnormal. Mild mitral valve regurgitation. No  evidence of mitral stenosis.   6. The tricuspid valve is abnormal.   7. The aortic valve has an indeterminant number of cusps. There is mild  calcification of the aortic valve. There is mild thickening of the aortic  valve. Aortic valve regurgitation is not visualized. No aortic stenosis is  present.   Physical Exam:   VS:  BP 108/62   Pulse 66   Ht 6\' 3"  (1.905 m)   Wt 273 lb (123.8 kg)   SpO2 100%   BMI 34.12 kg/m    Wt Readings from Last 3 Encounters:  09/25/22 273 lb (123.8 kg)  09/24/22 274 lb (124.3 kg)  09/14/22 287 lb (130.2 kg)     GEN: Pleasant, elderly male appearing in no acute distress NECK: No JVD; No carotid bruits CARDIAC: Irregularly irregular, no murmurs, rubs,  gallops RESPIRATORY:  Clear to auscultation without rales, wheezing or rhonchi  ABDOMEN: Appears non-distended. No obvious abdominal masses. EXTREMITIES: Dressings in place along lower extremities. No pitting edema.   Assessment and Plan:   1. CAD - He has known CAD as described above and most recent cardiac catheterization in 07/2021 showed no options for PCI with medical management recommended. He has baseline dyspnea on exertion but no recent chest pain. - He has been continued on Plavix 75 mg daily an is not on ASA given the need for anticoagulation. Also remains on Imdur, Toprol-XL, Zetia and Fenofibrate.  2. HFmrEF/Pericardial Effusion - His ejection fraction was at 40 to 45% by most recent imaging and he did have a moderate pericardial effusion with no evidence of tamponade. - He is not interested in additional testing at this time but could consider a follow-up echocardiogram at his next office visit for reassessment of his pericardial effusion. Suspect this has improved  with his 20 lb weight loss. In regards to his cardiomyopathy, he remains on Toprol-XL 50 mg daily. He has not been on ACE-I/ARB/ARNI/MRA given his renal function and is not a good candidate for an SGLT2 inhibitor given recurrent cellulitis. BP does not allow for Hydralazine/Nitrates.  - His PCP did reduce his Lasix to 40 mg daily and we reviewed that he can take an extra 20 mg as needed for weight gain or worsening edema. He also has a prescription for PRN Metolazone but does not use this frequently.   3. Paroxysmal Atrial Fibrillation - He reports occasional palpitations but his heart rate has overall been well-controlled. Continue Toprol-XL 50 mg daily for rate control and he does take an extra short-acting Lopressor as needed. - No reports of active bleeding. Continue Eliquis 5 mg twice daily for anticoagulation which is the appropriate dose at this time given his age, weight and renal function. Labs from 09/24/2022 showed  his hemoglobin was stable at 12.5 with platelets at 578 K.   4. PAD  - He is s/p right SFA stenting in 03/2019 and left fem-pop in 11/2020. Followed by Vascular Surgery. Remains on Plavix 75mg  daily and Zetia 10mg  daily. Intolerant to statin therapy.   5. HLD - LDL was at 76 in 01/2022. He remains on Zetia 10 mg daily and Fenofibrate 160 mg daily as he has been intolerant to multiple statins.  6. Stage 3 CKD - Baseline creatinine 1.8 - 1.9. Stable at 1.91 when checked on 09/24/2022.  Disposition: Will arrange for follow-up in the next 3 to 4 months. He reports he is not interested in undergoing further testing or hospitalizations. Says that he is ready to "go home" when it is his time. No SI. Discussed that he would likely benefit from Palliative Care given this viewpoint and he will plan to discuss further with his PCP.  Will route today's note to his PCP.   Signed, Ellsworth Lennox, PA-C

## 2022-09-28 ENCOUNTER — Encounter: Payer: Medicare Other | Admitting: *Deleted

## 2022-09-28 ENCOUNTER — Other Ambulatory Visit: Payer: Self-pay | Admitting: Family

## 2022-09-28 DIAGNOSIS — E785 Hyperlipidemia, unspecified: Secondary | ICD-10-CM

## 2022-09-29 ENCOUNTER — Other Ambulatory Visit: Payer: Self-pay | Admitting: Family

## 2022-09-29 DIAGNOSIS — I480 Paroxysmal atrial fibrillation: Secondary | ICD-10-CM

## 2022-09-30 ENCOUNTER — Telehealth: Payer: Self-pay

## 2022-09-30 NOTE — Telephone Encounter (Signed)
Transition Care Management Follow-up Telephone Call Date of discharge and from where: [facility] How have you been since you were released from the hospital? ok Any questions or concerns? No  Items Reviewed: Did the pt receive and understand the discharge instructions provided? Yes  Medications obtained and verified? Yes  Other? No  Any new allergies since your discharge? No  Dietary orders reviewed? No Do you have support at home? Yes   Follow up appointments reviewed:  PCP Hospital f/u appt confirmed? Yes  Scheduled to see  on  @ . Specialist Hospital f/u appt confirmed? No  Scheduled to see  on  @. Are transportation arrangements needed? No  If their condition worsens, is the pt aware to call PCP or go to the Emergency Dept.? Yes Was the patient provided with contact information for the PCP's office or ED? Yes Was to pt encouraged to call back with questions or concerns? Yes

## 2022-10-05 ENCOUNTER — Other Ambulatory Visit: Payer: Self-pay | Admitting: Family

## 2022-10-06 ENCOUNTER — Ambulatory Visit: Payer: Self-pay | Admitting: *Deleted

## 2022-10-06 NOTE — Telephone Encounter (Signed)
[facility] patient Last office visit 09/24/22 Listed on med list as historical, requires provider approval per protocol

## 2022-10-06 NOTE — Patient Outreach (Signed)
Care Coordination   Follow Up Visit Note   10/06/2022 Name: [name] [name] MRN: [id number] DOB: 1950/06/22  [name] [name] is a 72 y.o. year old male who sees [place], Edilia Bo, FNP for primary care. I spoke with  [name] [name] by phone today.  What matters to the patients health and wellness today?  He is hurting today: still feeling fatigue Less swelling after use of diuretics Weighing daily, recent value 279 lbs  He reports increase appetite After his 09/22/22 ED visit with SIRS He did not want to transfer from [facility] to [facility] He is taking his antibiotics, walking around his home, he uses his elliptical, massages  HgA1c remains elevated at 9.2 on 09/11/22  Had some cbg lows recently at 68 with worsening symptom of "shaking like a leaf"  He reports not eating before taking his insulin Another low value at 74 the next night States he will start taking protein food prior to taking his pm insulin    Goals Addressed             This Visit's Progress    [facility] care coordination services   On track    Interventions Today    Flowsheet Row Most Recent Value  Chronic Disease   Chronic disease during today's visit Congestive Heart Failure (CHF), Diabetes  General Interventions   General Interventions Discussed/Reviewed General Interventions Reviewed, Annual Foot Exam, Doctor Visits  Labs Hgb A1c every 3 months  Doctor Visits Discussed/Reviewed Doctor Visits Reviewed, PCP  Durable Medical Equipment (DME) Wheelchair  Wheelchair Standard  PCP/Specialist Visits Compliance with follow-up visit  Education Interventions   Education Provided Provided Education  Provided Verbal Education On Medication, Blood Sugar Monitoring  Labs Reviewed Hgb A1c  Mental Health Interventions   Mental Health Discussed/Reviewed Mental Health Reviewed, Coping Strategies              SDOH assessments and interventions completed:  No     Care Coordination Interventions:  Yes, provided   Follow  up plan: Follow up call scheduled for Feb 27, 2023    Encounter Outcome:  Pt. Visit Completed   Cozette Braggs L. Noelle Penner, RN, BSN, CCM [facility] Care Management Community Coordinator Office number [phone]

## 2022-10-06 NOTE — Patient Instructions (Signed)
Visit Information  Thank you for taking time to visit with me today. Please don't hesitate to contact me if I can be of assistance to you.   Following are the goals we discussed today:   Goals Addressed             This Visit's Progress    [facility] care coordination services   On track    Interventions Today    Flowsheet Row Most Recent Value  Chronic Disease   Chronic disease during today's visit Congestive Heart Failure (CHF), Diabetes  General Interventions   General Interventions Discussed/Reviewed General Interventions Reviewed, Annual Foot Exam, Doctor Visits  Labs Hgb A1c every 3 months  Doctor Visits Discussed/Reviewed Doctor Visits Reviewed, PCP  Durable Medical Equipment (DME) Wheelchair  Wheelchair Standard  PCP/Specialist Visits Compliance with follow-up visit  Education Interventions   Education Provided Provided Education  Provided Verbal Education On Medication, Blood Sugar Monitoring  Labs Reviewed Hgb A1c  Mental Health Interventions   Mental Health Discussed/Reviewed Mental Health Reviewed, Coping Strategies              Our next appointment is by telephone on February 05, 2023 at 1200  Please call the care guide team at [phone] if you need to cancel or reschedule your appointment.   If you are experiencing a Mental Health or Behavioral Health Crisis or need someone to talk to, please call the Suicide and Crisis Lifeline: 988   Patient verbalizes understanding of instructions and care plan provided today and agrees to view in MyChart. Active MyChart status and patient understanding of how to access instructions and care plan via MyChart confirmed with patient.     The patient has been provided with contact information for the care management team and has been advised to call with any health related questions or concerns.   Laurabeth Yip L. Noelle Penner, RN, BSN, CCM, Care Management Coordinator [phone]

## 2022-10-07 ENCOUNTER — Ambulatory Visit: Payer: Self-pay | Admitting: *Deleted

## 2022-10-07 ENCOUNTER — Encounter: Payer: Self-pay | Admitting: *Deleted

## 2022-10-07 NOTE — Patient Instructions (Signed)
Visit Information  Thank you for taking time to visit with me today. Please don't hesitate to contact me if I can be of assistance to you.   Following are the goals we discussed today:   Goals Addressed               This Visit's Progress     Obtain Counseling Resources and Supportive Services. (pt-stated)   On track     Care Coordination Interventions:  Interventions Today    Flowsheet Row Most Recent Value  Chronic Disease   Chronic disease during today's visit Congestive Heart Failure (CHF), Hypertension (HTN), Diabetes, Other  [Inability to Perform Activities of Daily Living Independently, Generalized Weakness, Anxiety & Depression]  General Interventions   General Interventions Discussed/Reviewed General Interventions Discussed, Labs, Vaccines, Doctor Visits, Annual Foot Exam, Health Screening, General Interventions Reviewed, Annual Eye Exam, Durable Medical Equipment (DME), Community Resources, Level of Care, Communication with  [Encouraged]  Labs Hgb A1c every 3 months  [Encouraged]  Vaccines COVID-19, Flu, Pneumonia, RSV, Shingles, Tetanus/Pertussis/Diphtheria  [Encouraged]  Doctor Visits Discussed/Reviewed Doctor Visits Discussed, Doctor Visits Reviewed, Annual Wellness Visits, PCP, Specialist  [Encouraged]  Health Screening Bone Density, Colonoscopy, Prostate  [Encouraged]  Durable Medical Equipment (DME) Wheelchair, BP Cuff  Wheelchair Standard  PCP/Specialist Visits Compliance with follow-up visit  [Encouraged]  Communication with PCP/Specialists, RN  [Encouraged]  Level of Care Adult Daycare, Personal Care Services, Applications, Assisted Living, Skilled Nursing Facility  [Encouraged]  Applications Medicaid, Personal Care Services  [Encouraged]  Exercise Interventions   Exercise Discussed/Reviewed Exercise Discussed, Assistive device use and maintanence, Exercise Reviewed, Physical Activity  [Encouraged]  Physical Activity Discussed/Reviewed Physical Activity  Discussed, Home Exercise Program (HEP), Physical Activity Reviewed, Types of exercise  [Encouraged]  Weight Management Weight maintenance  [Encouraged]  Education Interventions   Education Provided Provided Therapist, sports, Provided Web-based Education, Provided Education  Provided Verbal Education On Nutrition, Mental Health/Coping with Illness, When to see the doctor, Foot Care, Eye Care, Labs, Blood Sugar Monitoring, Applications, Exercise, Medication, Development worker, community, [employer] Resources  [Encouraged]  Labs Reviewed Hgb A1c  [Encouraged]  Applications Medicaid, Personal Care Services  [Encouraged]  Mental Health Interventions   Mental Health Discussed/Reviewed Mental Health Discussed, Anxiety, Depression, Mental Health Reviewed, Grief and Loss, Coping Strategies, Substance Abuse, Crisis, Suicide, Other  [Domestic Violence]  Nutrition Interventions   Nutrition Discussed/Reviewed Nutrition Discussed, Adding fruits and vegetables, Increaing proteins, Decreasing fats, Decreasing salt, Nutrition Reviewed, Fluid intake, Carbohydrate meal planning, Portion sizes, Decreasing sugar intake  [Encouraged]  Pharmacy Interventions   Pharmacy Dicussed/Reviewed Pharmacy Topics Discussed, Pharmacy Topics Reviewed, Medication Adherence, Affording Medications  [Encouraged]  Safety Interventions   Safety Discussed/Reviewed Safety Discussed, Safety Reviewed, Fall Risk, Home Safety  [Encouraged]  Home Safety Assistive Devices, Need for home safety assessment, Refer for community resources  [Encouraged]  Advanced Directive Interventions   Advanced Directives Discussed/Reviewed Advanced Directives Discussed  [Advance Directives in Place]     Active Listening & Reflection Utilized.  Verbalization of Feelings Encouraged.  Emotional Support Provided. Feelings of Caregiver Stress Validated. Caregiver Burnout & Fatigue Acknowledged. Caregiver Resources Reviewed. Caregiver Support Groups Mailed. Self-Enrollment  in Caregiver Support Group of Interest Emphasized. Problem Solving Interventions Activated. Task-Centered Solutions Implemented.   Solution-Focused Strategies Employed. Acceptance & Commitment Therapy Performed. Brief Cognitive Behavioral Therapy Initiated. Client-Centered Therapy Indicated. CSW Collaboration with Patient & Wife, Jibran Lehl to Request Review of The Following List of [name], Services, [employer], Mailed on 09/23/2022 & Again on 10/07/2022, Due to Non-Receipt: ~  Adult Day Care Programs  ~ In-Home Care & Respite Agencies ~ Home Health Care Agencies ~ Respite Care Agencies & Facilities ~ Family Caregivers ~ Personal Care Services Agency Providers ~ Home Care: Aging, Disability & Transit Services of [facility] CSW Collaboration with Patient & Wife, Asencion Mores to Control and instrumentation engineer with [name], Services & Resources of Interest, in An Effort to Obtain Care & Supervision in The Home. CSW Collaboration with Patient & Wife, Suriel Calverley to Encourage Referral to Psychiatrist for Psychotropic Medication Administration & Management. CSW Collaboration with Patient & Wife, Kaikoa Chea to Encourage Referral to Therapist for Psychotherapeutic Counseling & Supportive Services. CSW Collaboration with Patient & Wife, Lucero Elden to Request Review of The Following List of Counseling Agencies, Services & Resources, Mailed on 09/23/2022 & Again on 10/07/2022, Due to Non-Receipt: ~ Psychiatrists in [place], [place] & [place] ~ [employer] in [place], [place] ~ Loneliness & Isolation: Press photographer with Patient & Wife, Terrel Driskel to Control and instrumentation engineer with [employer], Services & Resources of Interest, in An Effort to Obtain Psychiatry & Therapy. CSW Collaboration with Patient & Wife, Delaney Partsch to [employer] with CSW [phone]), If They Have  Questions, Need Assistance, or If Additional Social Work Needs Are Identified Between Now & Our Next Scheduled Follow-Up Outreach Call.      Our next appointment is by telephone on 10/21/2022 at 12:00 pm.   Please call the care guide team at [phone] if you need to cancel or reschedule your appointment.   If you are experiencing a Mental Health or Behavioral Health Crisis or need someone to talk to, please call the Suicide and Crisis Lifeline: 988 call the [place] National Suicide Prevention Lifeline: (936)333-8343 or TTY: 2021187276 TTY 939-560-4760) to talk to a trained counselor call 1-800-273-TALK (toll free, 24 hour hotline) go to [facility] [address] [place] [phone]) call the [facility] Crisis Line: (807) 859-4667 call 911  Patient verbalizes understanding of instructions and care plan provided today and agrees to view in MyChart. Active MyChart status and patient understanding of how to access instructions and care plan via MyChart confirmed with patient.     Telephone follow up appointment with care management team member scheduled for:  10/21/2022 at 12:00 pm.   Danford Bad, BSW, MSW, LCSW  Licensed Clinical Social Worker  [facility]  Mailing [place]. [address], [place], [place] [address] Physical Address[address], [place], [place] [address] Toll Free Main # [phone] Fax # [phone] Cell # [phone] [email or web address]

## 2022-10-07 NOTE — Patient Outreach (Signed)
Care Coordination   Follow Up Visit Note   10/07/2022  Name: [name] [name] MRN: [id number] DOB: 03-06-1951  [name] [name] is a 72 y.o. year old male who sees [place], Edilia Bo, FNP for primary care. I spoke with [name] [name] and wife, Doil Balfanz by phone today.  What matters to the patients health and wellness today?  Receive Counseling Resources and Supportive Services.   Goals Addressed               This Visit's Progress     Obtain Counseling Resources and Supportive Services. (pt-stated)   On track     Care Coordination Interventions:  Interventions Today    Flowsheet Row Most Recent Value  Chronic Disease   Chronic disease during today's visit Congestive Heart Failure (CHF), Hypertension (HTN), Diabetes, Other  [Inability to Perform Activities of Daily Living Independently, Generalized Weakness, Anxiety & Depression]  General Interventions   General Interventions Discussed/Reviewed General Interventions Discussed, Labs, Vaccines, Doctor Visits, Annual Foot Exam, Health Screening, General Interventions Reviewed, Annual Eye Exam, Durable Medical Equipment (DME), Community Resources, Level of Care, Communication with  [Encouraged]  Labs Hgb A1c every 3 months  [Encouraged]  Vaccines COVID-19, Flu, Pneumonia, RSV, Shingles, Tetanus/Pertussis/Diphtheria  [Encouraged]  Doctor Visits Discussed/Reviewed Doctor Visits Discussed, Doctor Visits Reviewed, Annual Wellness Visits, PCP, Specialist  [Encouraged]  Health Screening Bone Density, Colonoscopy, Prostate  [Encouraged]  Durable Medical Equipment (DME) Wheelchair, BP Cuff  Wheelchair Standard  PCP/Specialist Visits Compliance with follow-up visit  [Encouraged]  Communication with PCP/Specialists, RN  [Encouraged]  Level of Care Adult Daycare, Personal Care Services, Applications, Assisted Living, Skilled Nursing Facility  [Encouraged]  Applications Medicaid, Personal Care Services  [Encouraged]  Exercise Interventions    Exercise Discussed/Reviewed Exercise Discussed, Assistive device use and maintanence, Exercise Reviewed, Physical Activity  [Encouraged]  Physical Activity Discussed/Reviewed Physical Activity Discussed, Home Exercise Program (HEP), Physical Activity Reviewed, Types of exercise  [Encouraged]  Weight Management Weight maintenance  [Encouraged]  Education Interventions   Education Provided Provided Therapist, sports, Provided Web-based Education, Provided Education  Provided Verbal Education On Nutrition, Mental Health/Coping with Illness, When to see the doctor, Foot Care, Eye Care, Labs, Blood Sugar Monitoring, Applications, Exercise, Medication, Development worker, community, [employer] Resources  [Encouraged]  Labs Reviewed Hgb A1c  [Encouraged]  Applications Medicaid, Personal Care Services  [Encouraged]  Mental Health Interventions   Mental Health Discussed/Reviewed Mental Health Discussed, Anxiety, Depression, Mental Health Reviewed, Grief and Loss, Coping Strategies, Substance Abuse, Crisis, Suicide, Other  [Domestic Violence]  Nutrition Interventions   Nutrition Discussed/Reviewed Nutrition Discussed, Adding fruits and vegetables, Increaing proteins, Decreasing fats, Decreasing salt, Nutrition Reviewed, Fluid intake, Carbohydrate meal planning, Portion sizes, Decreasing sugar intake  [Encouraged]  Pharmacy Interventions   Pharmacy Dicussed/Reviewed Pharmacy Topics Discussed, Pharmacy Topics Reviewed, Medication Adherence, Affording Medications  [Encouraged]  Safety Interventions   Safety Discussed/Reviewed Safety Discussed, Safety Reviewed, Fall Risk, Home Safety  [Encouraged]  Home Safety Assistive Devices, Need for home safety assessment, Refer for community resources  [Encouraged]  Advanced Directive Interventions   Advanced Directives Discussed/Reviewed Advanced Directives Discussed  [Advance Directives in Place]     Active Listening & Reflection Utilized.  Verbalization of Feelings Encouraged.   Emotional Support Provided. Feelings of Caregiver Stress Validated. Caregiver Burnout & Fatigue Acknowledged. Caregiver Resources Reviewed. Caregiver Support Groups Mailed. Self-Enrollment in Caregiver Support Group of Interest Emphasized. Problem Solving Interventions Activated. Task-Centered Solutions Implemented.   Solution-Focused Strategies Employed. Acceptance & Commitment Therapy Performed. Brief Cognitive Behavioral Therapy Initiated. Client-Centered  Therapy Indicated. CSW Collaboration with Patient & Wife, Clayborn Heyse to Request Review of The Following List of [name], [name], [employer], Mailed on 09/23/2022 & Again on 10/07/2022, Due to Non-Receipt: ~ Adult Day Care Programs  ~ In-Home Care & Respite Agencies ~ Home Health Care Agencies ~ Respite Care Agencies & Facilities ~ Family Caregivers ~ Personal Care Services Agency Providers ~ Home Care: Aging, Disability & Transit Services of [facility] CSW Collaboration with Patient & Wife, Dennys Hawkins to Control and instrumentation engineer with [name], Services & Resources of Interest, in An Effort to Obtain Care & Supervision in The Home. CSW Collaboration with Patient & Wife, Bossie Giambra to Encourage Referral to Psychiatrist for Psychotropic Medication Administration & Management. CSW Collaboration with Patient & Wife, Muhamad Gauld to Encourage Referral to Therapist for Psychotherapeutic Counseling & Supportive Services. CSW Collaboration with Patient & Wife, Linus Comito to Request Review of The Following List of Counseling Agencies, Services & Resources, Mailed on 09/23/2022 & Again on 10/07/2022, Due to Non-Receipt: ~ Psychiatrists in [place], [place] & [place] ~ [employer] in [place], [place] ~ Loneliness & Isolation: Press photographer with Patient & Wife, [place] to Control and instrumentation engineer with [employer], Services  & Resources of Interest, in An Effort to Obtain Psychiatry & Therapy. CSW Collaboration with Patient & Wife, Reginal Advani to [employer] with CSW [phone]), If They Have Questions, Need Assistance, or If Additional Social Work Needs Are Identified Between Now & Our Next Scheduled Follow-Up Outreach Call.      SDOH assessments and interventions completed:  Yes.  Care Coordination Interventions:  Yes, provided.   Follow up plan: Follow up call scheduled for 10/21/2022 at 12:00 pm.   Encounter Outcome:  Pt. Visit Completed.   Danford Bad, BSW, MSW, LCSW  Licensed Restaurant manager, [facility]  Mailing [place], [place], [place] [address] Physical Address[address], [place], [place] [address] Toll Free Main # [phone] Fax # [phone] Cell # [phone] [email or web address]

## 2022-10-08 ENCOUNTER — Ambulatory Visit: Payer: Medicare Other | Admitting: Family

## 2022-10-15 ENCOUNTER — Ambulatory Visit: Payer: Medicare Other | Admitting: Nurse Practitioner

## 2022-10-16 ENCOUNTER — Ambulatory Visit: Payer: Medicare Other | Admitting: Family

## 2022-10-21 ENCOUNTER — Encounter: Payer: Self-pay | Admitting: *Deleted

## 2022-10-21 ENCOUNTER — Ambulatory Visit: Payer: Self-pay | Admitting: *Deleted

## 2022-10-21 NOTE — Patient Instructions (Signed)
Visit Information  Thank you for taking time to visit with me today. Please don't hesitate to contact me if I can be of assistance to you.   Following are the goals we discussed today:   Goals Addressed               This Visit's Progress     Obtain Counseling Resources and Supportive Services. (pt-stated)   On track     Care Coordination Interventions:  Interventions Today    Flowsheet Row Most Recent Value  Chronic Disease   Chronic disease during today's visit Congestive Heart Failure (CHF), Hypertension (HTN), Diabetes, Other  [Inability to Perform Activities of Daily Living Independently, Generalized Weakness, Anxiety & Depression]  General Interventions   General Interventions Discussed/Reviewed General Interventions Discussed, Labs, Vaccines, Doctor Visits, Annual Foot Exam, Health Screening, General Interventions Reviewed, Annual Eye Exam, Durable Medical Equipment (DME), Community Resources, Level of Care, Communication with  [Encouraged]  Labs Hgb A1c every 3 months  [Encouraged]  Vaccines COVID-19, Flu, Pneumonia, RSV, Shingles, Tetanus/Pertussis/Diphtheria  [Encouraged]  Doctor Visits Discussed/Reviewed Doctor Visits Discussed, Doctor Visits Reviewed, Annual Wellness Visits, PCP, Specialist  [Encouraged]  Health Screening Bone Density, Colonoscopy, Prostate  [Encouraged]  Durable Medical Equipment (DME) Wheelchair, BP Cuff  Wheelchair Standard  PCP/Specialist Visits Compliance with follow-up visit  [Encouraged]  Communication with PCP/Specialists, RN  [Encouraged]  Level of Care Adult Daycare, Personal Care Services, Applications, Assisted Living, Skilled Nursing Facility  [Encouraged]  Applications Medicaid, Personal Care Services  [Encouraged]  Exercise Interventions   Exercise Discussed/Reviewed Exercise Discussed, Assistive device use and maintanence, Exercise Reviewed, Physical Activity  [Encouraged]  Physical Activity Discussed/Reviewed Physical Activity  Discussed, Home Exercise Program (HEP), Physical Activity Reviewed, Types of exercise  [Encouraged]  Weight Management Weight maintenance  [Encouraged]  Education Interventions   Education Provided Provided Therapist, sports, Provided Web-based Education, Provided Education  Provided Verbal Education On Nutrition, Mental Health/Coping with Illness, When to see the doctor, Foot Care, Eye Care, Labs, Blood Sugar Monitoring, Applications, Exercise, Medication, Development worker, community, [employer] Resources  [Encouraged]  Labs Reviewed Hgb A1c  [Encouraged]  Applications Medicaid, Personal Care Services  [Encouraged]  Mental Health Interventions   Mental Health Discussed/Reviewed Mental Health Discussed, Anxiety, Depression, Mental Health Reviewed, Grief and Loss, Coping Strategies, Substance Abuse, Crisis, Suicide, Other  [Domestic Violence]  Nutrition Interventions   Nutrition Discussed/Reviewed Nutrition Discussed, Adding fruits and vegetables, Increaing proteins, Decreasing fats, Decreasing salt, Nutrition Reviewed, Fluid intake, Carbohydrate meal planning, Portion sizes, Decreasing sugar intake  [Encouraged]  Pharmacy Interventions   Pharmacy Dicussed/Reviewed Pharmacy Topics Discussed, Pharmacy Topics Reviewed, Medication Adherence, Affording Medications  [Encouraged]  Safety Interventions   Safety Discussed/Reviewed Safety Discussed, Safety Reviewed, Fall Risk, Home Safety  [Encouraged]  Home Safety Assistive Devices, Need for home safety assessment, Refer for community resources  [Encouraged]  Advanced Directive Interventions   Advanced Directives Discussed/Reviewed Advanced Directives Discussed  [Advance Directives in Place]     Active Listening & Reflection Utilized.  Verbalization of Feelings Encouraged.  Emotional Support Provided. Feelings of Caregiver Stress Validated. Caregiver Burnout & Fatigue Acknowledged. Caregiver Resources Reviewed. Caregiver Support Groups Mailed. Self-Enrollment  in Caregiver Support Group of Interest Emphasized. Problem Solving Interventions Activated. Task-Centered Solutions Implemented.   Solution-Focused Strategies Employed. Acceptance & Commitment Therapy Performed. Cognitive Behavioral Therapy Initiated. Client-Centered Therapy Indicated. CSW Collaboration with Patient & Wife, [name] [name] to Confirm Receipt & Thoroughly Review the Following List of [name] [name], [name] [name], [name] [name], to [name] [name] & Entertain Questions: ~ Adult Day  Care Programs  ~ In-Home Care & Respite Agencies ~ Home Health Care Agencies ~ Respite Care Agencies & Facilities ~ Family Caregivers ~ Personal Care Services Agency Providers ~ Home Care: Aging, Disability & Transit Services of [facility] CSW Collaboration with Patient & Wife, [name] [name] to Control and instrumentation engineer with [name] [name], Services & Resources of Interest, in An Effort to Obtain Care & Supervision in The Home. CSW Collaboration with Patient & Wife, [name] [name] to Confirm Receipt & Thoroughly Review the Following List of Counseling Agencies, [employer], to [name] [name] & Entertain Questions: ~ Psychiatrists in [place], [place] & [place] ~ [employer] in [place], [place] ~ Loneliness & Isolation: Recruitment consultant CSW Collaboration with Patient & Wife, [name] [name] to Confirm Disinterest in Referral to Psychiatrist for Psychotropic Medication Administration & Management. CSW Collaboration with Patient & Wife, [name] [name] to Confirm Disinterest in Referral to Therapist for Psychotherapeutic Counseling & Supportive Services. CSW Collaboration with Patient & Wife, [name] [name] to [employer] with CSW [phone]), If They Have Questions, Need Assistance, or If Additional Social Work Needs Are Identified Between Now & Our Next Scheduled Follow-Up Outreach Call.      Our next appointment is by  telephone on 11/04/2022 at 1:30 pm.   Please call the care guide team at [phone] if you need to cancel or reschedule your appointment.   If you are experiencing a Mental Health or Behavioral Health Crisis or need someone to talk to, please call the Suicide and Crisis Lifeline: 988 call the [place] National Suicide Prevention Lifeline: 671 249 2819 or TTY: (858)698-2166 TTY 367-397-7076) to talk to a trained counselor call 1-800-273-TALK (toll free, 24 hour hotline) go to [facility] [address], [place] [phone]) call the [facility] Crisis Line: [phone] call 911  Patient verbalizes understanding of instructions and care plan provided today and agrees to view in MyChart. Active MyChart status and patient understanding of how to access instructions and care plan via MyChart confirmed with patient.     Telephone follow up appointment with care management team member scheduled for:  11/04/2022 at 1:30 pm.   Danford Bad, BSW, MSW, LCSW  Licensed Clinical Social Worker  [facility]  Mailing [place]. [address], [place], [place] [address] Physical Address[address], [place], [place] [address] Toll Free Main # [phone] Fax # [phone] Cell # [phone] [email or web address]

## 2022-10-21 NOTE — Patient Outreach (Signed)
Care Coordination   Follow Up Visit Note   10/21/2022  Name: [name] [name] MRN: [id number] DOB: 12-17-1950  [name] [name] is a 72 y.o. year old male who sees [name] [name], [name] Bo, FNP for primary care. I spoke with [name] [name] and patient's wife, [name] [name] by phone today.  What matters to [name] patients health and wellness today?  Obtain Counseling Resources and Cytogeneticist.   Goals Addressed               This Visit's Progress     Obtain Counseling Resources and Supportive Services. (pt-stated)   On track     Care Coordination Interventions:  Interventions Today    Flowsheet Row Most Recent Value  Chronic Disease   Chronic disease during today's visit Congestive Heart Failure (CHF), Hypertension (HTN), Diabetes, Other  [Inability to Perform Activities of Daily Living Independently, Generalized Weakness, Anxiety & Depression]  General Interventions   General Interventions Discussed/Reviewed General Interventions Discussed, Labs, Vaccines, Doctor Visits, Annual Foot Exam, Health Screening, General Interventions Reviewed, Annual Eye Exam, Durable Medical Equipment (DME), Community Resources, Level of Care, Communication with  [Encouraged]  Labs Hgb A1c every 3 months  [Encouraged]  Vaccines COVID-19, Flu, Pneumonia, RSV, Shingles, Tetanus/Pertussis/Diphtheria  [Encouraged]  Doctor Visits Discussed/Reviewed Doctor Visits Discussed, Doctor Visits Reviewed, Annual Wellness Visits, PCP, Specialist  [Encouraged]  Health Screening Bone Density, Colonoscopy, Prostate  [Encouraged]  Durable Medical Equipment (DME) Wheelchair, BP Cuff  Wheelchair Standard  PCP/Specialist Visits Compliance with follow-up visit  [Encouraged]  Communication with PCP/Specialists, RN  [Encouraged]  Level of Care Adult Daycare, Personal Care Services, Applications, Assisted Living, Skilled Nursing Facility  [Encouraged]  Applications Medicaid, Personal Care Services  [Encouraged]  Exercise Interventions    Exercise Discussed/Reviewed Exercise Discussed, Assistive device use and maintanence, Exercise Reviewed, Physical Activity  [Encouraged]  Physical Activity Discussed/Reviewed Physical Activity Discussed, Home Exercise Program (HEP), Physical Activity Reviewed, Types of exercise  [Encouraged]  Weight Management Weight maintenance  [Encouraged]  Education Interventions   Education Provided Provided Therapist, sports, Provided Web-based Education, Provided Education  Provided Verbal Education On Nutrition, Mental Health/Coping with Illness, When to see [name] doctor, Foot Care, Eye Care, Labs, Blood Sugar Monitoring, Applications, Exercise, Medication, Development worker, community, [employer] Resources  [Encouraged]  Labs Reviewed Hgb A1c  [Encouraged]  Applications Medicaid, Personal Care Services  [Encouraged]  Mental Health Interventions   Mental Health Discussed/Reviewed Mental Health Discussed, Anxiety, Depression, Mental Health Reviewed, Grief and Loss, Coping Strategies, Substance Abuse, Crisis, Suicide, Other  [Domestic Violence]  Nutrition Interventions   Nutrition Discussed/Reviewed Nutrition Discussed, Adding fruits and vegetables, Increaing proteins, Decreasing fats, Decreasing salt, Nutrition Reviewed, Fluid intake, Carbohydrate meal planning, Portion sizes, Decreasing sugar intake  [Encouraged]  Pharmacy Interventions   Pharmacy Dicussed/Reviewed Pharmacy Topics Discussed, Pharmacy Topics Reviewed, Medication Adherence, Affording Medications  [Encouraged]  Safety Interventions   Safety Discussed/Reviewed Safety Discussed, Safety Reviewed, Fall Risk, Home Safety  [Encouraged]  Home Safety Assistive Devices, Need for home safety assessment, Refer for community resources  [Encouraged]  Advanced Directive Interventions   Advanced Directives Discussed/Reviewed Advanced Directives Discussed  [Advance Directives in Place]     Active Listening & Reflection Utilized.  Verbalization of Feelings Encouraged.   Emotional Support Provided. Feelings of Caregiver Stress Validated. Caregiver Burnout & Fatigue Acknowledged. Caregiver Resources Reviewed. Caregiver Support Groups Mailed. Self-Enrollment in Caregiver Support Group of Interest Emphasized. Problem Solving Interventions Activated. Task-Centered Solutions Implemented.   Solution-Focused Strategies Employed. Acceptance & Commitment Therapy Performed. Cognitive Behavioral Therapy Initiated. [name]  Therapy [name]. [name] [name] Collaboration with Patient & Wife, [name] [name] to Confirm Receipt & Thoroughly Review [name] Following List of [name] [name], [name] [name], [name] [name], to [name] [name] & [name] [name]: ~ Adult Day Care Programs  ~ In-Home Care & Respite Agencies ~ Home Health Care Agencies ~ Respite Care Agencies & Facilities ~ Family Caregivers ~ Theatre manager Providers ~ Home Care: Aging, Disability & Transit Services of [facility] [name] [name] Collaboration with Patient & Wife, [name] [name] to Control and instrumentation engineer with [name] [name], Services & Resources of Interest, in An Effort to Obtain Care & Supervision in [name] Home. [name] [name] Collaboration with Patient & Wife, Holston Gartman to Confirm Receipt & Thoroughly Review [name] Following List of Counseling Agencies, [employer], to [name] [name] & [name] [name]: ~ Psychiatrists in [place], [name] [name] & [name] [name] ~ [employer] in [name] [name], [name] [name] ~ Loneliness & Isolation: Recruitment consultant [name] [name] Collaboration with Patient & Wife, [name] [name] to Confirm Disinterest in Referral to Psychiatrist for Psychotropic Medication Administration & Management. [name] [name] Collaboration with Patient & Wife, [name] [name] to Confirm Disinterest in Referral to Therapist for Psychotherapeutic Counseling & Supportive Services. [name] [name] Collaboration with Patient & Wife, [name] [name] to [employer] with [name] [name] [phone]), If They Have [name], Need Assistance, or If Additional Social Work Needs Are Identified Between Now & Our Next Scheduled Follow-Up Outreach Call.      SDOH assessments and interventions completed:  Yes.  Care Coordination Interventions:  Yes, provided.   Follow up plan: Follow up call scheduled for 11/04/2022 at 1:30 pm.   Encounter Outcome:  Pt. Visit Completed.   Danford Bad, BSW, MSW, LCSW  Licensed Restaurant manager, [facility]  Mailing [place] [address] [place], [name] [name] [address] Physical Address-[address], [place], [name] [name] [address] Toll Free Main # [phone] Fax # [phone] Cell # [phone] [email or web address]

## 2022-10-26 ENCOUNTER — Ambulatory Visit (INDEPENDENT_AMBULATORY_CARE_PROVIDER_SITE_OTHER): Payer: Medicare Other | Admitting: Family

## 2022-10-26 ENCOUNTER — Encounter: Payer: Self-pay | Admitting: Family

## 2022-10-26 VITALS — BP 112/79 | HR 84 | Temp 97.6°F | Ht 75.0 in | Wt 289.0 lb

## 2022-10-26 DIAGNOSIS — N189 Chronic kidney disease, unspecified: Secondary | ICD-10-CM

## 2022-10-26 DIAGNOSIS — G720 Drug-induced myopathy: Secondary | ICD-10-CM

## 2022-10-26 DIAGNOSIS — I5020 Unspecified systolic (congestive) heart failure: Secondary | ICD-10-CM

## 2022-10-26 DIAGNOSIS — E669 Obesity, unspecified: Secondary | ICD-10-CM

## 2022-10-26 DIAGNOSIS — I252 Old myocardial infarction: Secondary | ICD-10-CM

## 2022-10-26 DIAGNOSIS — M25571 Pain in right ankle and joints of right foot: Secondary | ICD-10-CM

## 2022-10-26 DIAGNOSIS — E1169 Type 2 diabetes mellitus with other specified complication: Secondary | ICD-10-CM | POA: Diagnosis not present

## 2022-10-26 DIAGNOSIS — R531 Weakness: Secondary | ICD-10-CM

## 2022-10-26 DIAGNOSIS — I70209 Unspecified atherosclerosis of native arteries of extremities, unspecified extremity: Secondary | ICD-10-CM | POA: Diagnosis not present

## 2022-10-26 DIAGNOSIS — E0842 Diabetes mellitus due to underlying condition with diabetic polyneuropathy: Secondary | ICD-10-CM

## 2022-10-26 DIAGNOSIS — K219 Gastro-esophageal reflux disease without esophagitis: Secondary | ICD-10-CM

## 2022-10-26 DIAGNOSIS — Z794 Long term (current) use of insulin: Secondary | ICD-10-CM | POA: Diagnosis not present

## 2022-10-26 DIAGNOSIS — I130 Hypertensive heart and chronic kidney disease with heart failure and stage 1 through stage 4 chronic kidney disease, or unspecified chronic kidney disease: Secondary | ICD-10-CM | POA: Diagnosis not present

## 2022-10-26 DIAGNOSIS — F419 Anxiety disorder, unspecified: Secondary | ICD-10-CM | POA: Diagnosis not present

## 2022-10-26 DIAGNOSIS — I5023 Acute on chronic systolic (congestive) heart failure: Secondary | ICD-10-CM | POA: Diagnosis not present

## 2022-10-26 DIAGNOSIS — E785 Hyperlipidemia, unspecified: Secondary | ICD-10-CM | POA: Diagnosis not present

## 2022-10-26 DIAGNOSIS — E1122 Type 2 diabetes mellitus with diabetic chronic kidney disease: Secondary | ICD-10-CM

## 2022-10-26 DIAGNOSIS — E1159 Type 2 diabetes mellitus with other circulatory complications: Secondary | ICD-10-CM | POA: Diagnosis not present

## 2022-10-26 LAB — BAYER DCA HB A1C WAIVED: HB A1C (BAYER DCA - WAIVED): 7.5 % — ABNORMAL HIGH (ref 4.8–5.6)

## 2022-10-26 MED ORDER — HYDROCODONE-ACETAMINOPHEN 10-325 MG PO TABS
1.0000 | ORAL_TABLET | Freq: Four times a day (QID) | ORAL | 0 refills | Status: DC | PRN
Start: 2022-10-26 — End: 2023-01-21

## 2022-10-26 MED ORDER — HUMULIN 70/30 (70-30) 100 UNIT/ML ~~LOC~~ SUSP
50.0000 [IU] | Freq: Two times a day (BID) | SUBCUTANEOUS | 2 refills | Status: DC
Start: 2022-10-26 — End: 2022-11-30

## 2022-10-26 NOTE — Progress Notes (Signed)
Subjective:    Patient ID: [name] [name], male    DOB: 03/09/1951, 72 y.o.   MRN: [id number]  Chief Complaint  Patient presents with   Follow-up    More water blister come up has a tuff time getting up and down. Right foot turns in when walking gotten worse when siatic nerver started bothering him   Diabetes   PT presents to the office today for chronic follow up. PT had NSTEMI on 07/30/16 and 05/23/20 and stent placed. Pt is followed by Cardiologists every 4 months for CAD and decreased EF. He had a cath 08/08/21 that was showed no blockage.     Pt currently in a clinical trial for hyperlipidemia. He can not tolerate statins related to muscle pain.  He is followed by Ortho for osteoarthritis and is currently getting injections in bilateral knees and back. Pt is followed by Pain Management every 2-3 months, but stopped because of multiple hospitalizations.   Followed by Nephrologists every 3 months for CKD.    He is followed by Vascular and had stent placed in his right leg and had a left femoral-popliteal  bypass on 11/22/20. He had a abdominal aortogram with lower extremity on 01/28/21.   He is followed by Vascular surgery and has a bypass 08/15/21. He has several vascular wounds on bilateral feet. Home Health is coming once a week, wife is doing dressing changes.   He has atherosclerosis    He is morbid obese with a BMI of 36 and diabetic and HTN.  Diabetes He presents for his follow-up diabetic visit. He has type 2 diabetes mellitus. Associated symptoms include blurred vision, fatigue and foot paresthesias. Symptoms are stable. Diabetic complications include heart disease, nephropathy, peripheral neuropathy and PVD. Risk factors for coronary artery disease include diabetes mellitus, dyslipidemia, hypertension, male sex and sedentary lifestyle. He is following a generally unhealthy diet. His overall blood glucose range is 180-200 mg/dl.  Hypertension This is a chronic problem. The  current episode started more than 1 year ago. The problem has been resolved since onset. The problem is controlled. Associated symptoms include blurred vision, malaise/fatigue and peripheral edema. Pertinent negatives include no shortness of breath. Risk factors for coronary artery disease include dyslipidemia, diabetes mellitus, obesity, male gender and sedentary lifestyle. The current treatment provides moderate improvement. Hypertensive end-organ damage includes PVD.  Congestive Heart Failure Presents for follow-up visit. Associated symptoms include edema and fatigue. Pertinent negatives include no shortness of breath. The symptoms have been stable.  Gastroesophageal Reflux He complains of belching and heartburn. This is a chronic problem. The current episode started more than 1 year ago. The problem occurs occasionally. Associated symptoms include fatigue. He has tried a PPI for the symptoms. The treatment provided moderate relief.  Back Pain This is a chronic problem. The current episode started more than 1 year ago. The problem occurs intermittently. The problem has been waxing and waning since onset. The pain is present in the lumbar spine. The quality of the pain is described as aching. The pain is at a severity of 10/10. The pain is moderate. Associated symptoms include leg pain. He has tried analgesics and bed rest for the symptoms. The treatment provided moderate relief.  Hyperlipidemia This is a chronic problem. The current episode started more than 1 year ago. Exacerbating diseases include obesity. Associated symptoms include leg pain. Pertinent negatives include no shortness of breath. Current antihyperlipidemic treatment includes statins. The current treatment provides moderate improvement of lipids. Risk factors for coronary  artery disease include dyslipidemia, diabetes mellitus, hypertension, male sex and a sedentary lifestyle.      Review of Systems  Constitutional:  Positive for  fatigue and malaise/fatigue.  Eyes:  Positive for blurred vision.  Respiratory:  Negative for shortness of breath.   Gastrointestinal:  Positive for heartburn.  Musculoskeletal:  Positive for back pain.  All other systems reviewed and are negative.      Objective:   Physical Exam Vitals reviewed.  Constitutional:      General: He is not in acute distress.    Appearance: He is well-developed. He is obese.  HENT:     Head: Normocephalic.     Right Ear: Tympanic membrane normal.     Left Ear: Tympanic membrane normal.  Eyes:     General:        Right eye: No discharge.        Left eye: No discharge.     Pupils: Pupils are equal, round, and reactive to light.  Neck:     Thyroid: No thyromegaly.  Cardiovascular:     Rate and Rhythm: Normal rate and regular rhythm.     Heart sounds: Normal heart sounds. No murmur heard. Pulmonary:     Effort: Pulmonary effort is normal. No respiratory distress.     Breath sounds: Normal breath sounds. No wheezing.  Abdominal:     General: Bowel sounds are normal. There is no distension.     Palpations: Abdomen is soft.     Tenderness: There is no abdominal tenderness.  Musculoskeletal:        General: No tenderness. Normal range of motion.     Cervical back: Normal range of motion and neck supple.  Skin:    General: Skin is warm and dry.     Findings: Erythema present. No rash.     Comments: Multiple wounds on bilateral legs, dressing intact  Neurological:     Mental Status: He is alert and oriented to person, place, and time.     Cranial Nerves: No cranial nerve deficit.     Motor: Weakness present.     Gait: Gait abnormal.     Deep Tendon Reflexes: Reflexes are normal and symmetric.  Psychiatric:        Behavior: Behavior normal.        Thought Content: Thought content normal.        Judgment: Judgment normal.       BP 112/79   Pulse 84   Temp 97.6 F (36.4 C) (Temporal)   Ht 6\' 3"  (1.905 m)   Wt 289 lb (131.1 kg)   SpO2  98%   BMI 36.12 kg/m      Assessment & Plan:  [name] [name] comes in today with chief complaint of Follow-up (More water blister come up has a tuff time getting up and down. Right foot turns in when walking gotten worse when siatic nerver started bothering him) and Diabetes   Diagnosis and orders addressed:  1. Acute on chronic HFrEF (heart failure with reduced ejection fraction) (HCC) - CMP14+EGFR - CBC with Differential/Platelet  2. Anxiety - CMP14+EGFR - CBC with Differential/Platelet  3. Atherosclerosis of arteries of extremities (HCC) - CMP14+EGFR - CBC with Differential/Platelet  4. Systolic congestive heart failure, unspecified HF chronicity (HCC) - CMP14+EGFR - CBC with Differential/Platelet  5. Class II obesity  - CMP14+EGFR - CBC with Differential/Platelet  6. Type 2 diabetes mellitus with other specified complication, with long-term current use of insulin (HCC) - CMP14+EGFR -  CBC with Differential/Platelet - Bayer DCA Hb A1c Waived - HUMULIN 70/30 (70-30) 100 UNIT/ML injection; Inject 50 Units into the skin 2 (two) times daily with a meal.  Dispense: 10 mL; Refill: 2  7. Diabetic polyneuropathy associated with diabetes mellitus due to underlying condition (HCC) - CMP14+EGFR - CBC with Differential/Platelet  8. Gastroesophageal reflux disease without esophagitis - CMP14+EGFR - CBC with Differential/Platelet  9. Generalized weakness  - CMP14+EGFR - CBC with Differential/Platelet  10. History of non-ST elevation myocardial infarction (NSTEMI) - CMP14+EGFR - CBC with Differential/Platelet  11. Hyperlipidemia associated with type 2 diabetes mellitus (HCC)  - CMP14+EGFR - CBC with Differential/Platelet  12. Hypertension associated with diabetes (HCC) - CMP14+EGFR - CBC with Differential/Platelet  13. Statin myopathy - CMP14+EGFR - CBC with Differential/Platelet  14. Acute right ankle pain - HYDROcodone-acetaminophen (NORCO) 10-325 MG tablet;  Take 1 tablet by mouth every 6 (six) hours as needed for moderate pain.  Dispense: 90 tablet; Refill: 0   Labs pending Health Maintenance reviewed Diet and exercise encouraged  Follow up plan: 3 months    Jannifer Rodney, FNP

## 2022-10-26 NOTE — Patient Instructions (Signed)
Neuropathic Pain Neuropathic pain is pain caused by damage to the nerves that are responsible for certain sensations in your body (sensory nerves). Neuropathic pain can make you more sensitive to pain. Even a minor sensation can feel very painful. This is usually a long-term (chronic) condition that can be difficult to treat. The type of pain differs from person to person. It may: Start suddenly (acute), or it may develop slowly and become chronic. Come and go as damaged nerves heal, or it may stay at the same level for years. Cause emotional distress, loss of sleep, and a lower quality of life. What are the causes? The most common cause of this condition is diabetes. Many other diseases and conditions can also cause neuropathic pain. Causes of neuropathic pain can be classified as: Toxic. This is caused by medicines and chemicals. The most common causes of toxic neuropathic pain is damage from medicines that kill cancer cells (chemotherapy) or alcohol abuse. Metabolic. This can be caused by: Diabetes. Lack of vitamins like B12. Traumatic. Any injury that cuts, crushes, or stretches a nerve can cause damage and pain. Compression-related. If a sensory nerve gets trapped or compressed for a long period of time, the blood supply to the nerve can be cut off. Vascular. Many blood vessel diseases can cause neuropathic pain by decreasing blood supply and oxygen to nerves. Autoimmune. This type of pain results from diseases in which the body's defense system (immune system) mistakenly attacks sensory nerves. Examples of autoimmune diseases that can cause neuropathic pain include lupus and multiple sclerosis. Infectious. Many types of viral infections can damage sensory nerves and cause pain. Shingles infection is a common cause of this type of pain. Inherited. Neuropathic pain can be a symptom of many diseases that are passed down through families (genetic). What increases the risk? You are more likely to  develop this condition if: You have diabetes. You smoke. You drink too much alcohol. You are taking certain medicines, including chemotherapy or medicines that treat immune system disorders. What are the signs or symptoms? The main symptom is pain. Neuropathic pain is often described as: Burning. Shock-like. Stinging. Hot or cold. Itching. How is this diagnosed? No single test can diagnose neuropathic pain. It is diagnosed based on: A physical exam and your symptoms. Your health care provider will ask you about your pain. You may be asked to use a pain scale to describe how bad your pain is. Tests. These may be done to see if you have a cause and location of any nerve damage. They include: Nerve conduction studies and electromyography to test how well nerve signals travel through your nerves and muscles (electrodiagnostic testing). Skin biopsy to evaluate for small fiber neuropathy. Imaging studies, such as: X-rays. CT scan. MRI. How is this treated? Treatment for neuropathic pain may change over time. You may need to try different treatment options or a combination of treatments. Some options include: Treating the underlying cause of the neuropathy, such as diabetes, kidney disease, or vitamin deficiencies. Stopping medicines that can cause neuropathy, such as chemotherapy. Medicine to relieve pain. Medicines may include: Prescription or over-the-counter pain medicine. Anti-seizure medicine. Antidepressant medicines. Pain-relieving patches or creams that are applied to painful areas of skin. A medicine to numb the area (local anesthetic), which can be injected as a nerve block. Transcutaneous nerve stimulation. This uses electrical currents to block painful nerve signals. The treatment is painless. Alternative treatments, such as: Acupuncture. Meditation. Massage. Occupational or physical therapy. Pain management programs. Counseling. Follow   these instructions at  home: Medicines  Take over-the-counter and prescription medicines only as told by your health care provider. Ask your health care provider if the medicine prescribed to you: Requires you to avoid driving or using machinery. Can cause constipation. You may need to take these actions to prevent or treat constipation: Drink enough fluid to keep your urine pale yellow. Take over-the-counter or prescription medicines. Eat foods that are high in fiber, such as beans, whole grains, and fresh fruits and vegetables. Limit foods that are high in fat and processed sugars, such as fried or sweet foods. Lifestyle  Have a good support system at home. Consider joining a chronic pain support group. Do not use any products that contain nicotine or tobacco. These products include cigarettes, chewing tobacco, and vaping devices, such as e-cigarettes. If you need help quitting, ask your health care provider. Do not drink alcohol. General instructions Learn as much as you can about your condition. Work closely with all your health care providers to find the treatment plan that works best for you. Ask your health care provider what activities are safe for you. Keep all follow-up visits. This is important. Contact a health care provider if: Your pain treatments are not working. You are having side effects from your medicines. You are struggling with tiredness (fatigue), mood changes, depression, or anxiety. Get help right away if: You have thoughts of hurting yourself. Get help right away if you feel like you may hurt yourself or others, or have thoughts about taking your own life. Go to your nearest emergency room or: Call 911. Call the National Suicide Prevention Lifeline at 1-800-273-8255 or 988. This is open 24 hours a day. Text the Crisis Text Line at 741741. Summary Neuropathic pain is pain caused by damage to the nerves that are responsible for certain sensations in your body (sensory  nerves). Neuropathic pain may come and go as damaged nerves heal, or it may stay at the same level for years. Neuropathic pain is usually a long-term condition that can be difficult to treat. Consider joining a chronic pain support group. This information is not intended to replace advice given to you by your health care provider. Make sure you discuss any questions you have with your health care provider. Document Revised: 12/30/2020 Document Reviewed: 12/30/2020 Elsevier Patient Education  2024 Elsevier Inc.  

## 2022-10-27 ENCOUNTER — Telehealth: Payer: Self-pay | Admitting: Family

## 2022-10-27 LAB — CMP14+EGFR
ALT: 9 IU/L (ref 0–44)
AST: 18 IU/L (ref 0–40)
Albumin/Globulin Ratio: 1.5
Albumin: 4.3 g/dL (ref 3.8–4.8)
Alkaline Phosphatase: 59 IU/L (ref 44–121)
BUN/Creatinine Ratio: 15 (ref 10–24)
BUN: 24 mg/dL (ref 8–27)
Bilirubin Total: 0.6 mg/dL (ref 0.0–1.2)
CO2: 22 mmol/L (ref 20–29)
Calcium: 9.4 mg/dL (ref 8.6–10.2)
Chloride: 100 mmol/L (ref 96–106)
Creatinine, Ser: 1.56 mg/dL — ABNORMAL HIGH (ref 0.76–1.27)
Globulin, Total: 2.9 g/dL (ref 1.5–4.5)
Glucose: 106 mg/dL — ABNORMAL HIGH (ref 70–99)
Potassium: 4.1 mmol/L (ref 3.5–5.2)
Sodium: 139 mmol/L (ref 134–144)
Total Protein: 7.2 g/dL (ref 6.0–8.5)
eGFR: 47 mL/min/{1.73_m2} — ABNORMAL LOW (ref >59)

## 2022-10-27 LAB — CBC WITH DIFFERENTIAL/PLATELET
Basophils Absolute: 0.1 10*3/uL (ref 0.0–0.2)
Basos: 1 %
EOS (ABSOLUTE): 0.1 10*3/uL (ref 0.0–0.4)
Eos: 2 %
Hematocrit: 36.4 % — ABNORMAL LOW (ref 37.5–51.0)
Hemoglobin: 11 g/dL — ABNORMAL LOW (ref 13.0–17.7)
Immature Grans (Abs): 0 10*3/uL (ref 0.0–0.1)
Immature Granulocytes: 0 %
Lymphocytes Absolute: 1.1 10*3/uL (ref 0.7–3.1)
Lymphs: 15 %
MCH: 25.1 pg — ABNORMAL LOW (ref 26.6–33.0)
MCHC: 30.2 g/dL — ABNORMAL LOW (ref 31.5–35.7)
MCV: 83 fL (ref 79–97)
Monocytes Absolute: 0.5 10*3/uL (ref 0.1–0.9)
Monocytes: 7 %
Neutrophils Absolute: 5.7 10*3/uL (ref 1.4–7.0)
Neutrophils: 75 %
Platelets: 265 10*3/uL (ref 150–450)
RBC: 4.38 x10E6/uL (ref 4.14–5.80)
RDW: 15.1 % (ref 11.6–15.4)
WBC: 7.5 10*3/uL (ref 3.4–10.8)

## 2022-10-27 NOTE — Addendum Note (Signed)
Addended by: Jannifer Rodney A on: 10/27/2022 05:35 PM   Modules accepted: Orders

## 2022-10-27 NOTE — Telephone Encounter (Signed)
HH order placed  Jannifer Rodney, FNP

## 2022-10-27 NOTE — Telephone Encounter (Signed)
Please send new AMB HH referral

## 2022-10-28 ENCOUNTER — Other Ambulatory Visit: Payer: Self-pay | Admitting: Vascular Surgery

## 2022-10-28 ENCOUNTER — Other Ambulatory Visit: Payer: Self-pay | Admitting: Family

## 2022-10-28 DIAGNOSIS — I2510 Atherosclerotic heart disease of native coronary artery without angina pectoris: Secondary | ICD-10-CM

## 2022-10-28 DIAGNOSIS — K219 Gastro-esophageal reflux disease without esophagitis: Secondary | ICD-10-CM

## 2022-10-30 DIAGNOSIS — I2510 Atherosclerotic heart disease of native coronary artery without angina pectoris: Secondary | ICD-10-CM | POA: Diagnosis not present

## 2022-10-30 DIAGNOSIS — D509 Iron deficiency anemia, unspecified: Secondary | ICD-10-CM | POA: Diagnosis not present

## 2022-10-30 DIAGNOSIS — K219 Gastro-esophageal reflux disease without esophagitis: Secondary | ICD-10-CM | POA: Diagnosis not present

## 2022-10-30 DIAGNOSIS — E1151 Type 2 diabetes mellitus with diabetic peripheral angiopathy without gangrene: Secondary | ICD-10-CM | POA: Diagnosis not present

## 2022-10-30 DIAGNOSIS — G720 Drug-induced myopathy: Secondary | ICD-10-CM | POA: Diagnosis not present

## 2022-10-30 DIAGNOSIS — I152 Hypertension secondary to endocrine disorders: Secondary | ICD-10-CM | POA: Diagnosis not present

## 2022-10-30 DIAGNOSIS — E785 Hyperlipidemia, unspecified: Secondary | ICD-10-CM | POA: Diagnosis not present

## 2022-10-30 DIAGNOSIS — I951 Orthostatic hypotension: Secondary | ICD-10-CM | POA: Diagnosis not present

## 2022-10-30 DIAGNOSIS — F32A Depression, unspecified: Secondary | ICD-10-CM | POA: Diagnosis not present

## 2022-10-30 DIAGNOSIS — N189 Chronic kidney disease, unspecified: Secondary | ICD-10-CM | POA: Diagnosis not present

## 2022-10-30 DIAGNOSIS — E1159 Type 2 diabetes mellitus with other circulatory complications: Secondary | ICD-10-CM | POA: Diagnosis not present

## 2022-10-30 DIAGNOSIS — L97818 Non-pressure chronic ulcer of other part of right lower leg with other specified severity: Secondary | ICD-10-CM | POA: Diagnosis not present

## 2022-10-30 DIAGNOSIS — I252 Old myocardial infarction: Secondary | ICD-10-CM | POA: Diagnosis not present

## 2022-10-30 DIAGNOSIS — E1122 Type 2 diabetes mellitus with diabetic chronic kidney disease: Secondary | ICD-10-CM | POA: Diagnosis not present

## 2022-10-30 DIAGNOSIS — E1142 Type 2 diabetes mellitus with diabetic polyneuropathy: Secondary | ICD-10-CM | POA: Diagnosis not present

## 2022-10-30 DIAGNOSIS — L97828 Non-pressure chronic ulcer of other part of left lower leg with other specified severity: Secondary | ICD-10-CM | POA: Diagnosis not present

## 2022-10-30 DIAGNOSIS — E1169 Type 2 diabetes mellitus with other specified complication: Secondary | ICD-10-CM | POA: Diagnosis not present

## 2022-10-30 DIAGNOSIS — T466X5D Adverse effect of antihyperlipidemic and antiarteriosclerotic drugs, subsequent encounter: Secondary | ICD-10-CM | POA: Diagnosis not present

## 2022-10-30 DIAGNOSIS — I70209 Unspecified atherosclerosis of native arteries of extremities, unspecified extremity: Secondary | ICD-10-CM | POA: Diagnosis not present

## 2022-10-30 DIAGNOSIS — Z87891 Personal history of nicotine dependence: Secondary | ICD-10-CM | POA: Diagnosis not present

## 2022-10-30 DIAGNOSIS — I872 Venous insufficiency (chronic) (peripheral): Secondary | ICD-10-CM | POA: Diagnosis not present

## 2022-10-30 DIAGNOSIS — I5023 Acute on chronic systolic (congestive) heart failure: Secondary | ICD-10-CM | POA: Diagnosis not present

## 2022-10-30 DIAGNOSIS — I480 Paroxysmal atrial fibrillation: Secondary | ICD-10-CM | POA: Diagnosis not present

## 2022-11-02 ENCOUNTER — Other Ambulatory Visit: Payer: Self-pay | Admitting: Family

## 2022-11-02 DIAGNOSIS — Z87891 Personal history of nicotine dependence: Secondary | ICD-10-CM | POA: Diagnosis not present

## 2022-11-02 DIAGNOSIS — L97818 Non-pressure chronic ulcer of other part of right lower leg with other specified severity: Secondary | ICD-10-CM | POA: Diagnosis not present

## 2022-11-02 DIAGNOSIS — T466X5D Adverse effect of antihyperlipidemic and antiarteriosclerotic drugs, subsequent encounter: Secondary | ICD-10-CM | POA: Diagnosis not present

## 2022-11-02 DIAGNOSIS — I5023 Acute on chronic systolic (congestive) heart failure: Secondary | ICD-10-CM | POA: Diagnosis not present

## 2022-11-02 DIAGNOSIS — E1159 Type 2 diabetes mellitus with other circulatory complications: Secondary | ICD-10-CM | POA: Diagnosis not present

## 2022-11-02 DIAGNOSIS — G720 Drug-induced myopathy: Secondary | ICD-10-CM | POA: Diagnosis not present

## 2022-11-02 DIAGNOSIS — I152 Hypertension secondary to endocrine disorders: Secondary | ICD-10-CM | POA: Diagnosis not present

## 2022-11-02 DIAGNOSIS — D509 Iron deficiency anemia, unspecified: Secondary | ICD-10-CM | POA: Diagnosis not present

## 2022-11-02 DIAGNOSIS — I480 Paroxysmal atrial fibrillation: Secondary | ICD-10-CM | POA: Diagnosis not present

## 2022-11-02 DIAGNOSIS — K219 Gastro-esophageal reflux disease without esophagitis: Secondary | ICD-10-CM | POA: Diagnosis not present

## 2022-11-02 DIAGNOSIS — I252 Old myocardial infarction: Secondary | ICD-10-CM | POA: Diagnosis not present

## 2022-11-02 DIAGNOSIS — I70209 Unspecified atherosclerosis of native arteries of extremities, unspecified extremity: Secondary | ICD-10-CM | POA: Diagnosis not present

## 2022-11-02 DIAGNOSIS — E1142 Type 2 diabetes mellitus with diabetic polyneuropathy: Secondary | ICD-10-CM | POA: Diagnosis not present

## 2022-11-02 DIAGNOSIS — L97828 Non-pressure chronic ulcer of other part of left lower leg with other specified severity: Secondary | ICD-10-CM | POA: Diagnosis not present

## 2022-11-02 DIAGNOSIS — E1151 Type 2 diabetes mellitus with diabetic peripheral angiopathy without gangrene: Secondary | ICD-10-CM | POA: Diagnosis not present

## 2022-11-02 DIAGNOSIS — E1122 Type 2 diabetes mellitus with diabetic chronic kidney disease: Secondary | ICD-10-CM | POA: Diagnosis not present

## 2022-11-02 DIAGNOSIS — I872 Venous insufficiency (chronic) (peripheral): Secondary | ICD-10-CM | POA: Diagnosis not present

## 2022-11-02 DIAGNOSIS — F32A Depression, unspecified: Secondary | ICD-10-CM | POA: Diagnosis not present

## 2022-11-02 DIAGNOSIS — N189 Chronic kidney disease, unspecified: Secondary | ICD-10-CM | POA: Diagnosis not present

## 2022-11-02 DIAGNOSIS — I951 Orthostatic hypotension: Secondary | ICD-10-CM | POA: Diagnosis not present

## 2022-11-02 DIAGNOSIS — E1169 Type 2 diabetes mellitus with other specified complication: Secondary | ICD-10-CM | POA: Diagnosis not present

## 2022-11-02 DIAGNOSIS — E785 Hyperlipidemia, unspecified: Secondary | ICD-10-CM | POA: Diagnosis not present

## 2022-11-02 DIAGNOSIS — I2510 Atherosclerotic heart disease of native coronary artery without angina pectoris: Secondary | ICD-10-CM | POA: Diagnosis not present

## 2022-11-02 NOTE — Addendum Note (Signed)
Addended by: Jannifer Rodney A on: 11/02/2022 02:08 PM   Modules accepted: Orders

## 2022-11-03 ENCOUNTER — Other Ambulatory Visit: Payer: Self-pay | Admitting: Family

## 2022-11-03 DIAGNOSIS — E1169 Type 2 diabetes mellitus with other specified complication: Secondary | ICD-10-CM

## 2022-11-04 ENCOUNTER — Encounter: Payer: Self-pay | Admitting: *Deleted

## 2022-11-04 ENCOUNTER — Ambulatory Visit: Payer: Self-pay | Admitting: *Deleted

## 2022-11-04 DIAGNOSIS — S81801A Unspecified open wound, right lower leg, initial encounter: Secondary | ICD-10-CM | POA: Diagnosis not present

## 2022-11-04 DIAGNOSIS — I87313 Chronic venous hypertension (idiopathic) with ulcer of bilateral lower extremity: Secondary | ICD-10-CM | POA: Diagnosis not present

## 2022-11-04 DIAGNOSIS — S81802A Unspecified open wound, left lower leg, initial encounter: Secondary | ICD-10-CM | POA: Diagnosis not present
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
## 2022-11-05 ENCOUNTER — Encounter: Payer: Self-pay | Admitting: *Deleted

## 2022-11-05 ENCOUNTER — Ambulatory Visit: Payer: Self-pay | Admitting: *Deleted

## 2022-11-05 DIAGNOSIS — T466X5D Adverse effect of antihyperlipidemic and antiarteriosclerotic drugs, subsequent encounter: Secondary | ICD-10-CM | POA: Diagnosis not present

## 2022-11-05 DIAGNOSIS — N189 Chronic kidney disease, unspecified: Secondary | ICD-10-CM | POA: Diagnosis not present

## 2022-11-05 DIAGNOSIS — E1159 Type 2 diabetes mellitus with other circulatory complications: Secondary | ICD-10-CM | POA: Diagnosis not present

## 2022-11-05 DIAGNOSIS — E1122 Type 2 diabetes mellitus with diabetic chronic kidney disease: Secondary | ICD-10-CM | POA: Diagnosis not present

## 2022-11-05 DIAGNOSIS — I5023 Acute on chronic systolic (congestive) heart failure: Secondary | ICD-10-CM | POA: Diagnosis not present

## 2022-11-05 DIAGNOSIS — L97828 Non-pressure chronic ulcer of other part of left lower leg with other specified severity: Secondary | ICD-10-CM | POA: Diagnosis not present

## 2022-11-05 DIAGNOSIS — I2510 Atherosclerotic heart disease of native coronary artery without angina pectoris: Secondary | ICD-10-CM | POA: Diagnosis not present

## 2022-11-05 DIAGNOSIS — I872 Venous insufficiency (chronic) (peripheral): Secondary | ICD-10-CM | POA: Diagnosis not present

## 2022-11-05 DIAGNOSIS — G720 Drug-induced myopathy: Secondary | ICD-10-CM | POA: Diagnosis not present

## 2022-11-05 DIAGNOSIS — Z87891 Personal history of nicotine dependence: Secondary | ICD-10-CM | POA: Diagnosis not present

## 2022-11-05 DIAGNOSIS — F32A Depression, unspecified: Secondary | ICD-10-CM | POA: Diagnosis not present

## 2022-11-05 DIAGNOSIS — E1142 Type 2 diabetes mellitus with diabetic polyneuropathy: Secondary | ICD-10-CM | POA: Diagnosis not present

## 2022-11-05 DIAGNOSIS — I152 Hypertension secondary to endocrine disorders: Secondary | ICD-10-CM | POA: Diagnosis not present

## 2022-11-05 DIAGNOSIS — I70209 Unspecified atherosclerosis of native arteries of extremities, unspecified extremity: Secondary | ICD-10-CM | POA: Diagnosis not present

## 2022-11-05 DIAGNOSIS — K219 Gastro-esophageal reflux disease without esophagitis: Secondary | ICD-10-CM | POA: Diagnosis not present

## 2022-11-05 DIAGNOSIS — I480 Paroxysmal atrial fibrillation: Secondary | ICD-10-CM | POA: Diagnosis not present

## 2022-11-05 DIAGNOSIS — I252 Old myocardial infarction: Secondary | ICD-10-CM | POA: Diagnosis not present

## 2022-11-05 DIAGNOSIS — L97818 Non-pressure chronic ulcer of other part of right lower leg with other specified severity: Secondary | ICD-10-CM | POA: Diagnosis not present

## 2022-11-05 DIAGNOSIS — E1169 Type 2 diabetes mellitus with other specified complication: Secondary | ICD-10-CM | POA: Diagnosis not present

## 2022-11-05 DIAGNOSIS — D509 Iron deficiency anemia, unspecified: Secondary | ICD-10-CM | POA: Diagnosis not present

## 2022-11-05 DIAGNOSIS — I951 Orthostatic hypotension: Secondary | ICD-10-CM | POA: Diagnosis not present

## 2022-11-05 DIAGNOSIS — E1151 Type 2 diabetes mellitus with diabetic peripheral angiopathy without gangrene: Secondary | ICD-10-CM | POA: Diagnosis not present

## 2022-11-05 DIAGNOSIS — E785 Hyperlipidemia, unspecified: Secondary | ICD-10-CM | POA: Diagnosis not present
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
## 2022-11-10 DIAGNOSIS — S81802A Unspecified open wound, left lower leg, initial encounter: Secondary | ICD-10-CM | POA: Diagnosis not present

## 2022-11-10 DIAGNOSIS — S91302A Unspecified open wound, left foot, initial encounter: Secondary | ICD-10-CM | POA: Diagnosis not present

## 2022-11-10 DIAGNOSIS — S81801A Unspecified open wound, right lower leg, initial encounter: Secondary | ICD-10-CM | POA: Diagnosis not present

## 2022-11-10 DIAGNOSIS — E11621 Type 2 diabetes mellitus with foot ulcer: Secondary | ICD-10-CM | POA: Diagnosis not present

## 2022-11-10 DIAGNOSIS — I87313 Chronic venous hypertension (idiopathic) with ulcer of bilateral lower extremity: Secondary | ICD-10-CM | POA: Diagnosis not present

## 2022-11-11 DIAGNOSIS — I152 Hypertension secondary to endocrine disorders: Secondary | ICD-10-CM | POA: Diagnosis not present

## 2022-11-11 DIAGNOSIS — E1169 Type 2 diabetes mellitus with other specified complication: Secondary | ICD-10-CM | POA: Diagnosis not present

## 2022-11-11 DIAGNOSIS — F32A Depression, unspecified: Secondary | ICD-10-CM | POA: Diagnosis not present

## 2022-11-11 DIAGNOSIS — N189 Chronic kidney disease, unspecified: Secondary | ICD-10-CM | POA: Diagnosis not present

## 2022-11-11 DIAGNOSIS — I252 Old myocardial infarction: Secondary | ICD-10-CM | POA: Diagnosis not present

## 2022-11-11 DIAGNOSIS — E1142 Type 2 diabetes mellitus with diabetic polyneuropathy: Secondary | ICD-10-CM | POA: Diagnosis not present

## 2022-11-11 DIAGNOSIS — I70209 Unspecified atherosclerosis of native arteries of extremities, unspecified extremity: Secondary | ICD-10-CM | POA: Diagnosis not present

## 2022-11-11 DIAGNOSIS — I2510 Atherosclerotic heart disease of native coronary artery without angina pectoris: Secondary | ICD-10-CM | POA: Diagnosis not present

## 2022-11-11 DIAGNOSIS — G720 Drug-induced myopathy: Secondary | ICD-10-CM | POA: Diagnosis not present

## 2022-11-11 DIAGNOSIS — I5023 Acute on chronic systolic (congestive) heart failure: Secondary | ICD-10-CM | POA: Diagnosis not present

## 2022-11-11 DIAGNOSIS — E1122 Type 2 diabetes mellitus with diabetic chronic kidney disease: Secondary | ICD-10-CM | POA: Diagnosis not present

## 2022-11-11 DIAGNOSIS — Z87891 Personal history of nicotine dependence: Secondary | ICD-10-CM | POA: Diagnosis not present

## 2022-11-11 DIAGNOSIS — T466X5D Adverse effect of antihyperlipidemic and antiarteriosclerotic drugs, subsequent encounter: Secondary | ICD-10-CM | POA: Diagnosis not present

## 2022-11-11 DIAGNOSIS — E1159 Type 2 diabetes mellitus with other circulatory complications: Secondary | ICD-10-CM | POA: Diagnosis not present

## 2022-11-11 DIAGNOSIS — E1151 Type 2 diabetes mellitus with diabetic peripheral angiopathy without gangrene: Secondary | ICD-10-CM | POA: Diagnosis not present

## 2022-11-11 DIAGNOSIS — I480 Paroxysmal atrial fibrillation: Secondary | ICD-10-CM | POA: Diagnosis not present

## 2022-11-11 DIAGNOSIS — K219 Gastro-esophageal reflux disease without esophagitis: Secondary | ICD-10-CM | POA: Diagnosis not present

## 2022-11-11 DIAGNOSIS — I872 Venous insufficiency (chronic) (peripheral): Secondary | ICD-10-CM | POA: Diagnosis not present

## 2022-11-11 DIAGNOSIS — I951 Orthostatic hypotension: Secondary | ICD-10-CM | POA: Diagnosis not present

## 2022-11-11 DIAGNOSIS — D509 Iron deficiency anemia, unspecified: Secondary | ICD-10-CM | POA: Diagnosis not present

## 2022-11-11 DIAGNOSIS — L97818 Non-pressure chronic ulcer of other part of right lower leg with other specified severity: Secondary | ICD-10-CM | POA: Diagnosis not present

## 2022-11-11 DIAGNOSIS — L97828 Non-pressure chronic ulcer of other part of left lower leg with other specified severity: Secondary | ICD-10-CM | POA: Diagnosis not present

## 2022-11-11 DIAGNOSIS — E785 Hyperlipidemia, unspecified: Secondary | ICD-10-CM | POA: Diagnosis not present

## 2022-11-12 DIAGNOSIS — I152 Hypertension secondary to endocrine disorders: Secondary | ICD-10-CM | POA: Diagnosis not present

## 2022-11-12 DIAGNOSIS — I480 Paroxysmal atrial fibrillation: Secondary | ICD-10-CM | POA: Diagnosis not present

## 2022-11-12 DIAGNOSIS — I872 Venous insufficiency (chronic) (peripheral): Secondary | ICD-10-CM | POA: Diagnosis not present

## 2022-11-12 DIAGNOSIS — I5023 Acute on chronic systolic (congestive) heart failure: Secondary | ICD-10-CM | POA: Diagnosis not present

## 2022-11-12 DIAGNOSIS — E1159 Type 2 diabetes mellitus with other circulatory complications: Secondary | ICD-10-CM | POA: Diagnosis not present

## 2022-11-12 DIAGNOSIS — I951 Orthostatic hypotension: Secondary | ICD-10-CM | POA: Diagnosis not present

## 2022-11-12 DIAGNOSIS — I2510 Atherosclerotic heart disease of native coronary artery without angina pectoris: Secondary | ICD-10-CM | POA: Diagnosis not present

## 2022-11-12 DIAGNOSIS — Z87891 Personal history of nicotine dependence: Secondary | ICD-10-CM | POA: Diagnosis not present

## 2022-11-12 DIAGNOSIS — E1169 Type 2 diabetes mellitus with other specified complication: Secondary | ICD-10-CM | POA: Diagnosis not present

## 2022-11-12 DIAGNOSIS — E1122 Type 2 diabetes mellitus with diabetic chronic kidney disease: Secondary | ICD-10-CM | POA: Diagnosis not present

## 2022-11-12 DIAGNOSIS — L97828 Non-pressure chronic ulcer of other part of left lower leg with other specified severity: Secondary | ICD-10-CM | POA: Diagnosis not present

## 2022-11-12 DIAGNOSIS — E1151 Type 2 diabetes mellitus with diabetic peripheral angiopathy without gangrene: Secondary | ICD-10-CM | POA: Diagnosis not present

## 2022-11-12 DIAGNOSIS — E785 Hyperlipidemia, unspecified: Secondary | ICD-10-CM | POA: Diagnosis not present

## 2022-11-12 DIAGNOSIS — D509 Iron deficiency anemia, unspecified: Secondary | ICD-10-CM | POA: Diagnosis not present

## 2022-11-12 DIAGNOSIS — K219 Gastro-esophageal reflux disease without esophagitis: Secondary | ICD-10-CM | POA: Diagnosis not present

## 2022-11-12 DIAGNOSIS — E1142 Type 2 diabetes mellitus with diabetic polyneuropathy: Secondary | ICD-10-CM | POA: Diagnosis not present

## 2022-11-12 DIAGNOSIS — L97818 Non-pressure chronic ulcer of other part of right lower leg with other specified severity: Secondary | ICD-10-CM | POA: Diagnosis not present

## 2022-11-12 DIAGNOSIS — F32A Depression, unspecified: Secondary | ICD-10-CM | POA: Diagnosis not present

## 2022-11-12 DIAGNOSIS — N189 Chronic kidney disease, unspecified: Secondary | ICD-10-CM | POA: Diagnosis not present

## 2022-11-12 DIAGNOSIS — T466X5D Adverse effect of antihyperlipidemic and antiarteriosclerotic drugs, subsequent encounter: Secondary | ICD-10-CM | POA: Diagnosis not present

## 2022-11-12 DIAGNOSIS — I252 Old myocardial infarction: Secondary | ICD-10-CM | POA: Diagnosis not present

## 2022-11-12 DIAGNOSIS — G720 Drug-induced myopathy: Secondary | ICD-10-CM | POA: Diagnosis not present

## 2022-11-12 DIAGNOSIS — I70209 Unspecified atherosclerosis of native arteries of extremities, unspecified extremity: Secondary | ICD-10-CM | POA: Diagnosis not present

## 2022-11-16 ENCOUNTER — Ambulatory Visit (INDEPENDENT_AMBULATORY_CARE_PROVIDER_SITE_OTHER): Payer: Medicare Other

## 2022-11-16 ENCOUNTER — Encounter: Payer: Medicare Other | Admitting: Psychology

## 2022-11-16 DIAGNOSIS — N189 Chronic kidney disease, unspecified: Secondary | ICD-10-CM | POA: Diagnosis not present

## 2022-11-16 DIAGNOSIS — I5023 Acute on chronic systolic (congestive) heart failure: Secondary | ICD-10-CM

## 2022-11-16 DIAGNOSIS — E1159 Type 2 diabetes mellitus with other circulatory complications: Secondary | ICD-10-CM

## 2022-11-16 DIAGNOSIS — I480 Paroxysmal atrial fibrillation: Secondary | ICD-10-CM | POA: Diagnosis not present

## 2022-11-16 DIAGNOSIS — I70209 Unspecified atherosclerosis of native arteries of extremities, unspecified extremity: Secondary | ICD-10-CM

## 2022-11-16 DIAGNOSIS — E1151 Type 2 diabetes mellitus with diabetic peripheral angiopathy without gangrene: Secondary | ICD-10-CM | POA: Diagnosis not present

## 2022-11-16 DIAGNOSIS — L97818 Non-pressure chronic ulcer of other part of right lower leg with other specified severity: Secondary | ICD-10-CM | POA: Diagnosis not present

## 2022-11-16 DIAGNOSIS — L97828 Non-pressure chronic ulcer of other part of left lower leg with other specified severity: Secondary | ICD-10-CM | POA: Diagnosis not present

## 2022-11-16 DIAGNOSIS — I152 Hypertension secondary to endocrine disorders: Secondary | ICD-10-CM | POA: Diagnosis not present

## 2022-11-16 DIAGNOSIS — I872 Venous insufficiency (chronic) (peripheral): Secondary | ICD-10-CM | POA: Diagnosis not present

## 2022-11-17 ENCOUNTER — Other Ambulatory Visit: Payer: Self-pay | Admitting: Family

## 2022-11-17 DIAGNOSIS — K219 Gastro-esophageal reflux disease without esophagitis: Secondary | ICD-10-CM | POA: Diagnosis not present

## 2022-11-17 DIAGNOSIS — E1159 Type 2 diabetes mellitus with other circulatory complications: Secondary | ICD-10-CM | POA: Diagnosis not present

## 2022-11-17 DIAGNOSIS — I152 Hypertension secondary to endocrine disorders: Secondary | ICD-10-CM | POA: Diagnosis not present

## 2022-11-17 DIAGNOSIS — E1169 Type 2 diabetes mellitus with other specified complication: Secondary | ICD-10-CM | POA: Diagnosis not present

## 2022-11-17 DIAGNOSIS — I872 Venous insufficiency (chronic) (peripheral): Secondary | ICD-10-CM | POA: Diagnosis not present

## 2022-11-17 DIAGNOSIS — I951 Orthostatic hypotension: Secondary | ICD-10-CM | POA: Diagnosis not present

## 2022-11-17 DIAGNOSIS — I2510 Atherosclerotic heart disease of native coronary artery without angina pectoris: Secondary | ICD-10-CM | POA: Diagnosis not present

## 2022-11-17 DIAGNOSIS — I70209 Unspecified atherosclerosis of native arteries of extremities, unspecified extremity: Secondary | ICD-10-CM | POA: Diagnosis not present

## 2022-11-17 DIAGNOSIS — G720 Drug-induced myopathy: Secondary | ICD-10-CM | POA: Diagnosis not present

## 2022-11-17 DIAGNOSIS — E1142 Type 2 diabetes mellitus with diabetic polyneuropathy: Secondary | ICD-10-CM | POA: Diagnosis not present

## 2022-11-17 DIAGNOSIS — E1122 Type 2 diabetes mellitus with diabetic chronic kidney disease: Secondary | ICD-10-CM | POA: Diagnosis not present

## 2022-11-17 DIAGNOSIS — D509 Iron deficiency anemia, unspecified: Secondary | ICD-10-CM | POA: Diagnosis not present

## 2022-11-17 DIAGNOSIS — L97818 Non-pressure chronic ulcer of other part of right lower leg with other specified severity: Secondary | ICD-10-CM | POA: Diagnosis not present

## 2022-11-17 DIAGNOSIS — E1151 Type 2 diabetes mellitus with diabetic peripheral angiopathy without gangrene: Secondary | ICD-10-CM | POA: Diagnosis not present

## 2022-11-17 DIAGNOSIS — T466X5D Adverse effect of antihyperlipidemic and antiarteriosclerotic drugs, subsequent encounter: Secondary | ICD-10-CM | POA: Diagnosis not present

## 2022-11-17 DIAGNOSIS — N189 Chronic kidney disease, unspecified: Secondary | ICD-10-CM | POA: Diagnosis not present

## 2022-11-17 DIAGNOSIS — I252 Old myocardial infarction: Secondary | ICD-10-CM | POA: Diagnosis not present

## 2022-11-17 DIAGNOSIS — E785 Hyperlipidemia, unspecified: Secondary | ICD-10-CM | POA: Diagnosis not present

## 2022-11-17 DIAGNOSIS — F32A Depression, unspecified: Secondary | ICD-10-CM | POA: Diagnosis not present

## 2022-11-17 DIAGNOSIS — Z87891 Personal history of nicotine dependence: Secondary | ICD-10-CM | POA: Diagnosis not present

## 2022-11-17 DIAGNOSIS — I5023 Acute on chronic systolic (congestive) heart failure: Secondary | ICD-10-CM | POA: Diagnosis not present

## 2022-11-17 DIAGNOSIS — I480 Paroxysmal atrial fibrillation: Secondary | ICD-10-CM | POA: Diagnosis not present

## 2022-11-17 DIAGNOSIS — L97828 Non-pressure chronic ulcer of other part of left lower leg with other specified severity: Secondary | ICD-10-CM | POA: Diagnosis not present

## 2022-11-18 ENCOUNTER — Ambulatory Visit: Payer: Self-pay | Admitting: *Deleted

## 2022-11-18 ENCOUNTER — Encounter: Payer: Self-pay | Admitting: *Deleted

## 2022-11-18 DIAGNOSIS — L97828 Non-pressure chronic ulcer of other part of left lower leg with other specified severity: Secondary | ICD-10-CM | POA: Diagnosis not present

## 2022-11-18 DIAGNOSIS — D509 Iron deficiency anemia, unspecified: Secondary | ICD-10-CM | POA: Diagnosis not present

## 2022-11-18 DIAGNOSIS — E1169 Type 2 diabetes mellitus with other specified complication: Secondary | ICD-10-CM | POA: Diagnosis not present

## 2022-11-18 DIAGNOSIS — E1142 Type 2 diabetes mellitus with diabetic polyneuropathy: Secondary | ICD-10-CM | POA: Diagnosis not present

## 2022-11-18 DIAGNOSIS — E785 Hyperlipidemia, unspecified: Secondary | ICD-10-CM | POA: Diagnosis not present

## 2022-11-18 DIAGNOSIS — F32A Depression, unspecified: Secondary | ICD-10-CM | POA: Diagnosis not present

## 2022-11-18 DIAGNOSIS — I480 Paroxysmal atrial fibrillation: Secondary | ICD-10-CM | POA: Diagnosis not present

## 2022-11-18 DIAGNOSIS — I70209 Unspecified atherosclerosis of native arteries of extremities, unspecified extremity: Secondary | ICD-10-CM | POA: Diagnosis not present

## 2022-11-18 DIAGNOSIS — I951 Orthostatic hypotension: Secondary | ICD-10-CM | POA: Diagnosis not present

## 2022-11-18 DIAGNOSIS — L97818 Non-pressure chronic ulcer of other part of right lower leg with other specified severity: Secondary | ICD-10-CM | POA: Diagnosis not present

## 2022-11-18 DIAGNOSIS — I2510 Atherosclerotic heart disease of native coronary artery without angina pectoris: Secondary | ICD-10-CM | POA: Diagnosis not present

## 2022-11-18 DIAGNOSIS — T466X5D Adverse effect of antihyperlipidemic and antiarteriosclerotic drugs, subsequent encounter: Secondary | ICD-10-CM | POA: Diagnosis not present

## 2022-11-18 DIAGNOSIS — G720 Drug-induced myopathy: Secondary | ICD-10-CM | POA: Diagnosis not present

## 2022-11-18 DIAGNOSIS — I872 Venous insufficiency (chronic) (peripheral): Secondary | ICD-10-CM | POA: Diagnosis not present

## 2022-11-18 DIAGNOSIS — E1151 Type 2 diabetes mellitus with diabetic peripheral angiopathy without gangrene: Secondary | ICD-10-CM | POA: Diagnosis not present

## 2022-11-18 DIAGNOSIS — E1122 Type 2 diabetes mellitus with diabetic chronic kidney disease: Secondary | ICD-10-CM | POA: Diagnosis not present

## 2022-11-18 DIAGNOSIS — I152 Hypertension secondary to endocrine disorders: Secondary | ICD-10-CM | POA: Diagnosis not present

## 2022-11-18 DIAGNOSIS — N189 Chronic kidney disease, unspecified: Secondary | ICD-10-CM | POA: Diagnosis not present

## 2022-11-18 DIAGNOSIS — K219 Gastro-esophageal reflux disease without esophagitis: Secondary | ICD-10-CM | POA: Diagnosis not present

## 2022-11-18 DIAGNOSIS — Z87891 Personal history of nicotine dependence: Secondary | ICD-10-CM | POA: Diagnosis not present

## 2022-11-18 DIAGNOSIS — I5023 Acute on chronic systolic (congestive) heart failure: Secondary | ICD-10-CM | POA: Diagnosis not present

## 2022-11-18 DIAGNOSIS — E1159 Type 2 diabetes mellitus with other circulatory complications: Secondary | ICD-10-CM | POA: Diagnosis not present

## 2022-11-18 DIAGNOSIS — I252 Old myocardial infarction: Secondary | ICD-10-CM | POA: Diagnosis not present
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
## 2022-11-25 DIAGNOSIS — E1122 Type 2 diabetes mellitus with diabetic chronic kidney disease: Secondary | ICD-10-CM | POA: Diagnosis not present

## 2022-11-25 DIAGNOSIS — I252 Old myocardial infarction: Secondary | ICD-10-CM | POA: Diagnosis not present

## 2022-11-25 DIAGNOSIS — E1151 Type 2 diabetes mellitus with diabetic peripheral angiopathy without gangrene: Secondary | ICD-10-CM | POA: Diagnosis not present

## 2022-11-25 DIAGNOSIS — I480 Paroxysmal atrial fibrillation: Secondary | ICD-10-CM | POA: Diagnosis not present

## 2022-11-25 DIAGNOSIS — E1142 Type 2 diabetes mellitus with diabetic polyneuropathy: Secondary | ICD-10-CM | POA: Diagnosis not present

## 2022-11-25 DIAGNOSIS — D509 Iron deficiency anemia, unspecified: Secondary | ICD-10-CM | POA: Diagnosis not present

## 2022-11-25 DIAGNOSIS — I152 Hypertension secondary to endocrine disorders: Secondary | ICD-10-CM | POA: Diagnosis not present

## 2022-11-25 DIAGNOSIS — Z87891 Personal history of nicotine dependence: Secondary | ICD-10-CM | POA: Diagnosis not present

## 2022-11-25 DIAGNOSIS — E785 Hyperlipidemia, unspecified: Secondary | ICD-10-CM | POA: Diagnosis not present

## 2022-11-25 DIAGNOSIS — G720 Drug-induced myopathy: Secondary | ICD-10-CM | POA: Diagnosis not present

## 2022-11-25 DIAGNOSIS — K219 Gastro-esophageal reflux disease without esophagitis: Secondary | ICD-10-CM | POA: Diagnosis not present

## 2022-11-25 DIAGNOSIS — I951 Orthostatic hypotension: Secondary | ICD-10-CM | POA: Diagnosis not present

## 2022-11-25 DIAGNOSIS — L97828 Non-pressure chronic ulcer of other part of left lower leg with other specified severity: Secondary | ICD-10-CM | POA: Diagnosis not present

## 2022-11-25 DIAGNOSIS — T466X5D Adverse effect of antihyperlipidemic and antiarteriosclerotic drugs, subsequent encounter: Secondary | ICD-10-CM | POA: Diagnosis not present

## 2022-11-25 DIAGNOSIS — N189 Chronic kidney disease, unspecified: Secondary | ICD-10-CM | POA: Diagnosis not present

## 2022-11-25 DIAGNOSIS — L97818 Non-pressure chronic ulcer of other part of right lower leg with other specified severity: Secondary | ICD-10-CM | POA: Diagnosis not present

## 2022-11-25 DIAGNOSIS — I70209 Unspecified atherosclerosis of native arteries of extremities, unspecified extremity: Secondary | ICD-10-CM | POA: Diagnosis not present

## 2022-11-25 DIAGNOSIS — F32A Depression, unspecified: Secondary | ICD-10-CM | POA: Diagnosis not present

## 2022-11-25 DIAGNOSIS — E1169 Type 2 diabetes mellitus with other specified complication: Secondary | ICD-10-CM | POA: Diagnosis not present

## 2022-11-25 DIAGNOSIS — I872 Venous insufficiency (chronic) (peripheral): Secondary | ICD-10-CM | POA: Diagnosis not present

## 2022-11-25 DIAGNOSIS — E1159 Type 2 diabetes mellitus with other circulatory complications: Secondary | ICD-10-CM | POA: Diagnosis not present

## 2022-11-25 DIAGNOSIS — I5023 Acute on chronic systolic (congestive) heart failure: Secondary | ICD-10-CM | POA: Diagnosis not present

## 2022-11-25 DIAGNOSIS — I2510 Atherosclerotic heart disease of native coronary artery without angina pectoris: Secondary | ICD-10-CM | POA: Diagnosis not present

## 2022-11-26 DIAGNOSIS — L97828 Non-pressure chronic ulcer of other part of left lower leg with other specified severity: Secondary | ICD-10-CM | POA: Diagnosis not present

## 2022-11-26 DIAGNOSIS — G720 Drug-induced myopathy: Secondary | ICD-10-CM | POA: Diagnosis not present

## 2022-11-26 DIAGNOSIS — I152 Hypertension secondary to endocrine disorders: Secondary | ICD-10-CM | POA: Diagnosis not present

## 2022-11-26 DIAGNOSIS — L97818 Non-pressure chronic ulcer of other part of right lower leg with other specified severity: Secondary | ICD-10-CM | POA: Diagnosis not present

## 2022-11-26 DIAGNOSIS — I951 Orthostatic hypotension: Secondary | ICD-10-CM | POA: Diagnosis not present

## 2022-11-26 DIAGNOSIS — I5023 Acute on chronic systolic (congestive) heart failure: Secondary | ICD-10-CM | POA: Diagnosis not present

## 2022-11-26 DIAGNOSIS — I872 Venous insufficiency (chronic) (peripheral): Secondary | ICD-10-CM | POA: Diagnosis not present

## 2022-11-26 DIAGNOSIS — T466X5D Adverse effect of antihyperlipidemic and antiarteriosclerotic drugs, subsequent encounter: Secondary | ICD-10-CM | POA: Diagnosis not present

## 2022-11-26 DIAGNOSIS — E1169 Type 2 diabetes mellitus with other specified complication: Secondary | ICD-10-CM | POA: Diagnosis not present

## 2022-11-26 DIAGNOSIS — E785 Hyperlipidemia, unspecified: Secondary | ICD-10-CM | POA: Diagnosis not present

## 2022-11-26 DIAGNOSIS — F32A Depression, unspecified: Secondary | ICD-10-CM | POA: Diagnosis not present

## 2022-11-26 DIAGNOSIS — I480 Paroxysmal atrial fibrillation: Secondary | ICD-10-CM | POA: Diagnosis not present

## 2022-11-26 DIAGNOSIS — I70209 Unspecified atherosclerosis of native arteries of extremities, unspecified extremity: Secondary | ICD-10-CM | POA: Diagnosis not present

## 2022-11-26 DIAGNOSIS — E1142 Type 2 diabetes mellitus with diabetic polyneuropathy: Secondary | ICD-10-CM | POA: Diagnosis not present

## 2022-11-26 DIAGNOSIS — K219 Gastro-esophageal reflux disease without esophagitis: Secondary | ICD-10-CM | POA: Diagnosis not present

## 2022-11-26 DIAGNOSIS — I2510 Atherosclerotic heart disease of native coronary artery without angina pectoris: Secondary | ICD-10-CM | POA: Diagnosis not present

## 2022-11-26 DIAGNOSIS — E1159 Type 2 diabetes mellitus with other circulatory complications: Secondary | ICD-10-CM | POA: Diagnosis not present

## 2022-11-26 DIAGNOSIS — E1151 Type 2 diabetes mellitus with diabetic peripheral angiopathy without gangrene: Secondary | ICD-10-CM | POA: Diagnosis not present

## 2022-11-26 DIAGNOSIS — I252 Old myocardial infarction: Secondary | ICD-10-CM | POA: Diagnosis not present

## 2022-11-26 DIAGNOSIS — E1122 Type 2 diabetes mellitus with diabetic chronic kidney disease: Secondary | ICD-10-CM | POA: Diagnosis not present

## 2022-11-26 DIAGNOSIS — N189 Chronic kidney disease, unspecified: Secondary | ICD-10-CM | POA: Diagnosis not present

## 2022-11-26 DIAGNOSIS — D509 Iron deficiency anemia, unspecified: Secondary | ICD-10-CM | POA: Diagnosis not present

## 2022-11-26 DIAGNOSIS — Z87891 Personal history of nicotine dependence: Secondary | ICD-10-CM | POA: Diagnosis not present

## 2022-11-27 ENCOUNTER — Other Ambulatory Visit: Payer: Self-pay | Admitting: Family

## 2022-11-27 DIAGNOSIS — E1169 Type 2 diabetes mellitus with other specified complication: Secondary | ICD-10-CM

## 2022-11-29 DIAGNOSIS — L97529 Non-pressure chronic ulcer of other part of left foot with unspecified severity: Secondary | ICD-10-CM | POA: Diagnosis not present

## 2022-11-29 DIAGNOSIS — L89312 Pressure ulcer of right buttock, stage 2: Secondary | ICD-10-CM | POA: Diagnosis not present

## 2022-11-29 DIAGNOSIS — E11621 Type 2 diabetes mellitus with foot ulcer: Secondary | ICD-10-CM | POA: Diagnosis not present

## 2022-11-29 DIAGNOSIS — L97819 Non-pressure chronic ulcer of other part of right lower leg with unspecified severity: Secondary | ICD-10-CM | POA: Diagnosis not present

## 2022-11-29 DIAGNOSIS — I87311 Chronic venous hypertension (idiopathic) with ulcer of right lower extremity: Secondary | ICD-10-CM | POA: Diagnosis not present

## 2022-12-02 DIAGNOSIS — E1169 Type 2 diabetes mellitus with other specified complication: Secondary | ICD-10-CM | POA: Diagnosis not present

## 2022-12-02 DIAGNOSIS — D509 Iron deficiency anemia, unspecified: Secondary | ICD-10-CM | POA: Diagnosis not present

## 2022-12-02 DIAGNOSIS — L97828 Non-pressure chronic ulcer of other part of left lower leg with other specified severity: Secondary | ICD-10-CM | POA: Diagnosis not present

## 2022-12-02 DIAGNOSIS — I951 Orthostatic hypotension: Secondary | ICD-10-CM | POA: Diagnosis not present

## 2022-12-02 DIAGNOSIS — I252 Old myocardial infarction: Secondary | ICD-10-CM | POA: Diagnosis not present

## 2022-12-02 DIAGNOSIS — E1159 Type 2 diabetes mellitus with other circulatory complications: Secondary | ICD-10-CM | POA: Diagnosis not present

## 2022-12-02 DIAGNOSIS — G720 Drug-induced myopathy: Secondary | ICD-10-CM | POA: Diagnosis not present

## 2022-12-02 DIAGNOSIS — E1122 Type 2 diabetes mellitus with diabetic chronic kidney disease: Secondary | ICD-10-CM | POA: Diagnosis not present

## 2022-12-02 DIAGNOSIS — E1142 Type 2 diabetes mellitus with diabetic polyneuropathy: Secondary | ICD-10-CM | POA: Diagnosis not present

## 2022-12-02 DIAGNOSIS — Z87891 Personal history of nicotine dependence: Secondary | ICD-10-CM | POA: Diagnosis not present

## 2022-12-02 DIAGNOSIS — I5023 Acute on chronic systolic (congestive) heart failure: Secondary | ICD-10-CM | POA: Diagnosis not present

## 2022-12-02 DIAGNOSIS — E785 Hyperlipidemia, unspecified: Secondary | ICD-10-CM | POA: Diagnosis not present

## 2022-12-02 DIAGNOSIS — I872 Venous insufficiency (chronic) (peripheral): Secondary | ICD-10-CM | POA: Diagnosis not present

## 2022-12-02 DIAGNOSIS — N189 Chronic kidney disease, unspecified: Secondary | ICD-10-CM | POA: Diagnosis not present

## 2022-12-02 DIAGNOSIS — L97818 Non-pressure chronic ulcer of other part of right lower leg with other specified severity: Secondary | ICD-10-CM | POA: Diagnosis not present

## 2022-12-02 DIAGNOSIS — I152 Hypertension secondary to endocrine disorders: Secondary | ICD-10-CM | POA: Diagnosis not present

## 2022-12-02 DIAGNOSIS — I70209 Unspecified atherosclerosis of native arteries of extremities, unspecified extremity: Secondary | ICD-10-CM | POA: Diagnosis not present

## 2022-12-02 DIAGNOSIS — F32A Depression, unspecified: Secondary | ICD-10-CM | POA: Diagnosis not present

## 2022-12-02 DIAGNOSIS — I480 Paroxysmal atrial fibrillation: Secondary | ICD-10-CM | POA: Diagnosis not present

## 2022-12-02 DIAGNOSIS — K219 Gastro-esophageal reflux disease without esophagitis: Secondary | ICD-10-CM | POA: Diagnosis not present

## 2022-12-02 DIAGNOSIS — I2510 Atherosclerotic heart disease of native coronary artery without angina pectoris: Secondary | ICD-10-CM | POA: Diagnosis not present

## 2022-12-02 DIAGNOSIS — E1151 Type 2 diabetes mellitus with diabetic peripheral angiopathy without gangrene: Secondary | ICD-10-CM | POA: Diagnosis not present

## 2022-12-02 DIAGNOSIS — T466X5D Adverse effect of antihyperlipidemic and antiarteriosclerotic drugs, subsequent encounter: Secondary | ICD-10-CM | POA: Diagnosis not present

## 2022-12-03 ENCOUNTER — Encounter: Payer: Self-pay | Admitting: *Deleted

## 2022-12-03 ENCOUNTER — Ambulatory Visit: Payer: Self-pay | Admitting: *Deleted

## 2022-12-03 DIAGNOSIS — T466X5D Adverse effect of antihyperlipidemic and antiarteriosclerotic drugs, subsequent encounter: Secondary | ICD-10-CM | POA: Diagnosis not present

## 2022-12-03 DIAGNOSIS — E785 Hyperlipidemia, unspecified: Secondary | ICD-10-CM | POA: Diagnosis not present

## 2022-12-03 DIAGNOSIS — I951 Orthostatic hypotension: Secondary | ICD-10-CM | POA: Diagnosis not present

## 2022-12-03 DIAGNOSIS — E1169 Type 2 diabetes mellitus with other specified complication: Secondary | ICD-10-CM | POA: Diagnosis not present

## 2022-12-03 DIAGNOSIS — K219 Gastro-esophageal reflux disease without esophagitis: Secondary | ICD-10-CM | POA: Diagnosis not present

## 2022-12-03 DIAGNOSIS — N189 Chronic kidney disease, unspecified: Secondary | ICD-10-CM | POA: Diagnosis not present

## 2022-12-03 DIAGNOSIS — I872 Venous insufficiency (chronic) (peripheral): Secondary | ICD-10-CM | POA: Diagnosis not present

## 2022-12-03 DIAGNOSIS — I5023 Acute on chronic systolic (congestive) heart failure: Secondary | ICD-10-CM | POA: Diagnosis not present

## 2022-12-03 DIAGNOSIS — E1142 Type 2 diabetes mellitus with diabetic polyneuropathy: Secondary | ICD-10-CM | POA: Diagnosis not present

## 2022-12-03 DIAGNOSIS — F32A Depression, unspecified: Secondary | ICD-10-CM | POA: Diagnosis not present

## 2022-12-03 DIAGNOSIS — I252 Old myocardial infarction: Secondary | ICD-10-CM | POA: Diagnosis not present

## 2022-12-03 DIAGNOSIS — I70209 Unspecified atherosclerosis of native arteries of extremities, unspecified extremity: Secondary | ICD-10-CM | POA: Diagnosis not present

## 2022-12-03 DIAGNOSIS — I152 Hypertension secondary to endocrine disorders: Secondary | ICD-10-CM | POA: Diagnosis not present

## 2022-12-03 DIAGNOSIS — E1151 Type 2 diabetes mellitus with diabetic peripheral angiopathy without gangrene: Secondary | ICD-10-CM | POA: Diagnosis not present

## 2022-12-03 DIAGNOSIS — L97828 Non-pressure chronic ulcer of other part of left lower leg with other specified severity: Secondary | ICD-10-CM | POA: Diagnosis not present

## 2022-12-03 DIAGNOSIS — D509 Iron deficiency anemia, unspecified: Secondary | ICD-10-CM | POA: Diagnosis not present

## 2022-12-03 DIAGNOSIS — G720 Drug-induced myopathy: Secondary | ICD-10-CM | POA: Diagnosis not present

## 2022-12-03 DIAGNOSIS — I480 Paroxysmal atrial fibrillation: Secondary | ICD-10-CM | POA: Diagnosis not present

## 2022-12-03 DIAGNOSIS — E1159 Type 2 diabetes mellitus with other circulatory complications: Secondary | ICD-10-CM | POA: Diagnosis not present

## 2022-12-03 DIAGNOSIS — L97818 Non-pressure chronic ulcer of other part of right lower leg with other specified severity: Secondary | ICD-10-CM | POA: Diagnosis not present

## 2022-12-03 DIAGNOSIS — Z87891 Personal history of nicotine dependence: Secondary | ICD-10-CM | POA: Diagnosis not present

## 2022-12-03 DIAGNOSIS — I2510 Atherosclerotic heart disease of native coronary artery without angina pectoris: Secondary | ICD-10-CM | POA: Diagnosis not present

## 2022-12-03 DIAGNOSIS — E1122 Type 2 diabetes mellitus with diabetic chronic kidney disease: Secondary | ICD-10-CM | POA: Diagnosis not present
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
## 2022-12-10 DIAGNOSIS — E785 Hyperlipidemia, unspecified: Secondary | ICD-10-CM | POA: Diagnosis not present

## 2022-12-10 DIAGNOSIS — I5023 Acute on chronic systolic (congestive) heart failure: Secondary | ICD-10-CM | POA: Diagnosis not present

## 2022-12-10 DIAGNOSIS — Z87891 Personal history of nicotine dependence: Secondary | ICD-10-CM | POA: Diagnosis not present

## 2022-12-10 DIAGNOSIS — T466X5D Adverse effect of antihyperlipidemic and antiarteriosclerotic drugs, subsequent encounter: Secondary | ICD-10-CM | POA: Diagnosis not present

## 2022-12-10 DIAGNOSIS — E1122 Type 2 diabetes mellitus with diabetic chronic kidney disease: Secondary | ICD-10-CM | POA: Diagnosis not present

## 2022-12-10 DIAGNOSIS — E1169 Type 2 diabetes mellitus with other specified complication: Secondary | ICD-10-CM | POA: Diagnosis not present

## 2022-12-10 DIAGNOSIS — L97818 Non-pressure chronic ulcer of other part of right lower leg with other specified severity: Secondary | ICD-10-CM | POA: Diagnosis not present

## 2022-12-10 DIAGNOSIS — F32A Depression, unspecified: Secondary | ICD-10-CM | POA: Diagnosis not present

## 2022-12-10 DIAGNOSIS — N189 Chronic kidney disease, unspecified: Secondary | ICD-10-CM | POA: Diagnosis not present

## 2022-12-10 DIAGNOSIS — I252 Old myocardial infarction: Secondary | ICD-10-CM | POA: Diagnosis not present

## 2022-12-10 DIAGNOSIS — I480 Paroxysmal atrial fibrillation: Secondary | ICD-10-CM | POA: Diagnosis not present

## 2022-12-10 DIAGNOSIS — G720 Drug-induced myopathy: Secondary | ICD-10-CM | POA: Diagnosis not present

## 2022-12-10 DIAGNOSIS — K219 Gastro-esophageal reflux disease without esophagitis: Secondary | ICD-10-CM | POA: Diagnosis not present

## 2022-12-10 DIAGNOSIS — L97828 Non-pressure chronic ulcer of other part of left lower leg with other specified severity: Secondary | ICD-10-CM | POA: Diagnosis not present

## 2022-12-10 DIAGNOSIS — I872 Venous insufficiency (chronic) (peripheral): Secondary | ICD-10-CM | POA: Diagnosis not present

## 2022-12-10 DIAGNOSIS — E1159 Type 2 diabetes mellitus with other circulatory complications: Secondary | ICD-10-CM | POA: Diagnosis not present

## 2022-12-10 DIAGNOSIS — I951 Orthostatic hypotension: Secondary | ICD-10-CM | POA: Diagnosis not present

## 2022-12-10 DIAGNOSIS — D509 Iron deficiency anemia, unspecified: Secondary | ICD-10-CM | POA: Diagnosis not present

## 2022-12-10 DIAGNOSIS — E1142 Type 2 diabetes mellitus with diabetic polyneuropathy: Secondary | ICD-10-CM | POA: Diagnosis not present

## 2022-12-10 DIAGNOSIS — I70209 Unspecified atherosclerosis of native arteries of extremities, unspecified extremity: Secondary | ICD-10-CM | POA: Diagnosis not present

## 2022-12-10 DIAGNOSIS — I152 Hypertension secondary to endocrine disorders: Secondary | ICD-10-CM | POA: Diagnosis not present

## 2022-12-10 DIAGNOSIS — I2510 Atherosclerotic heart disease of native coronary artery without angina pectoris: Secondary | ICD-10-CM | POA: Diagnosis not present

## 2022-12-10 DIAGNOSIS — E1151 Type 2 diabetes mellitus with diabetic peripheral angiopathy without gangrene: Secondary | ICD-10-CM | POA: Diagnosis not present

## 2022-12-17 DIAGNOSIS — E1151 Type 2 diabetes mellitus with diabetic peripheral angiopathy without gangrene: Secondary | ICD-10-CM | POA: Diagnosis not present

## 2022-12-17 DIAGNOSIS — K219 Gastro-esophageal reflux disease without esophagitis: Secondary | ICD-10-CM | POA: Diagnosis not present

## 2022-12-17 DIAGNOSIS — E785 Hyperlipidemia, unspecified: Secondary | ICD-10-CM | POA: Diagnosis not present

## 2022-12-17 DIAGNOSIS — Z87891 Personal history of nicotine dependence: Secondary | ICD-10-CM | POA: Diagnosis not present

## 2022-12-17 DIAGNOSIS — I872 Venous insufficiency (chronic) (peripheral): Secondary | ICD-10-CM | POA: Diagnosis not present

## 2022-12-17 DIAGNOSIS — E1169 Type 2 diabetes mellitus with other specified complication: Secondary | ICD-10-CM | POA: Diagnosis not present

## 2022-12-17 DIAGNOSIS — I5023 Acute on chronic systolic (congestive) heart failure: Secondary | ICD-10-CM | POA: Diagnosis not present

## 2022-12-17 DIAGNOSIS — I951 Orthostatic hypotension: Secondary | ICD-10-CM | POA: Diagnosis not present

## 2022-12-17 DIAGNOSIS — I480 Paroxysmal atrial fibrillation: Secondary | ICD-10-CM | POA: Diagnosis not present

## 2022-12-17 DIAGNOSIS — N189 Chronic kidney disease, unspecified: Secondary | ICD-10-CM | POA: Diagnosis not present

## 2022-12-17 DIAGNOSIS — I70209 Unspecified atherosclerosis of native arteries of extremities, unspecified extremity: Secondary | ICD-10-CM | POA: Diagnosis not present

## 2022-12-17 DIAGNOSIS — D509 Iron deficiency anemia, unspecified: Secondary | ICD-10-CM | POA: Diagnosis not present

## 2022-12-17 DIAGNOSIS — T466X5D Adverse effect of antihyperlipidemic and antiarteriosclerotic drugs, subsequent encounter: Secondary | ICD-10-CM | POA: Diagnosis not present

## 2022-12-17 DIAGNOSIS — L97828 Non-pressure chronic ulcer of other part of left lower leg with other specified severity: Secondary | ICD-10-CM | POA: Diagnosis not present

## 2022-12-17 DIAGNOSIS — F32A Depression, unspecified: Secondary | ICD-10-CM | POA: Diagnosis not present

## 2022-12-17 DIAGNOSIS — G720 Drug-induced myopathy: Secondary | ICD-10-CM | POA: Diagnosis not present

## 2022-12-17 DIAGNOSIS — E1159 Type 2 diabetes mellitus with other circulatory complications: Secondary | ICD-10-CM | POA: Diagnosis not present

## 2022-12-17 DIAGNOSIS — L97818 Non-pressure chronic ulcer of other part of right lower leg with other specified severity: Secondary | ICD-10-CM | POA: Diagnosis not present

## 2022-12-17 DIAGNOSIS — E1142 Type 2 diabetes mellitus with diabetic polyneuropathy: Secondary | ICD-10-CM | POA: Diagnosis not present

## 2022-12-17 DIAGNOSIS — E1122 Type 2 diabetes mellitus with diabetic chronic kidney disease: Secondary | ICD-10-CM | POA: Diagnosis not present

## 2022-12-17 DIAGNOSIS — I2510 Atherosclerotic heart disease of native coronary artery without angina pectoris: Secondary | ICD-10-CM | POA: Diagnosis not present

## 2022-12-17 DIAGNOSIS — I152 Hypertension secondary to endocrine disorders: Secondary | ICD-10-CM | POA: Diagnosis not present

## 2022-12-17 DIAGNOSIS — I252 Old myocardial infarction: Secondary | ICD-10-CM | POA: Diagnosis not present

## 2022-12-20 DIAGNOSIS — I87311 Chronic venous hypertension (idiopathic) with ulcer of right lower extremity: Secondary | ICD-10-CM | POA: Diagnosis not present

## 2022-12-20 DIAGNOSIS — L89612 Pressure ulcer of right heel, stage 2: Secondary | ICD-10-CM | POA: Diagnosis not present

## 2022-12-20 DIAGNOSIS — L97911 Non-pressure chronic ulcer of unspecified part of right lower leg limited to breakdown of skin: Secondary | ICD-10-CM | POA: Diagnosis not present

## 2022-12-20 DIAGNOSIS — L97921 Non-pressure chronic ulcer of unspecified part of left lower leg limited to breakdown of skin: Secondary | ICD-10-CM | POA: Diagnosis not present

## 2022-12-20 DIAGNOSIS — E11621 Type 2 diabetes mellitus with foot ulcer: Secondary | ICD-10-CM | POA: Diagnosis not present

## 2022-12-24 ENCOUNTER — Ambulatory Visit: Payer: Self-pay | Admitting: *Deleted

## 2022-12-24 ENCOUNTER — Encounter: Payer: Self-pay | Admitting: *Deleted

## 2022-12-24 NOTE — Patient Outreach (Signed)
Care Coordination   Follow Up Visit Note   12/24/2022  Name: [name] [name] MRN: [id number] DOB: 1950-08-25  [name] [name] is a 72 y.o. year old male who sees [place], Edilia Bo, FNP for primary care. I spoke with [name] [name] and wife, [name] [name] by phone today.  What matters to the patients health and wellness today?  Obtain Counseling Resources and Cytogeneticist.   Goals Addressed               This Visit's Progress     Obtain Counseling Resources and Supportive Services. (pt-stated)   On track     Care Coordination Interventions:  Interventions Today    Flowsheet Row Most Recent Value  Chronic Disease   Chronic disease during today's visit Congestive Heart Failure (CHF), Hypertension (HTN), Diabetes, Other  [Inability to Perform Activities of Daily Living Independently, Generalized Weakness, Anxiety & Depression]  General Interventions   General Interventions Discussed/Reviewed General Interventions Discussed, Labs, Vaccines, Doctor Visits, Annual Foot Exam, Health Screening, General Interventions Reviewed, Annual Eye Exam, Durable Medical Equipment (DME), Community Resources, Level of Care, Communication with  [Encouraged]  Labs Hgb A1c every 3 months  [Encouraged]  Vaccines COVID-19, Flu, Pneumonia, RSV, Shingles, Tetanus/Pertussis/Diphtheria  [Encouraged]  Doctor Visits Discussed/Reviewed Doctor Visits Discussed, Doctor Visits Reviewed, Annual Wellness Visits, PCP, Specialist  [Encouraged]  Health Screening Bone Density, Colonoscopy, Prostate  [Encouraged]  Durable Medical Equipment (DME) Wheelchair, BP Cuff  Wheelchair Standard  PCP/Specialist Visits Compliance with follow-up visit  [Encouraged]  Communication with PCP/Specialists, RN  [Encouraged]  Level of Care Adult Daycare, Personal Care Services, Applications, Assisted Living, Skilled Nursing Facility  [Encouraged]  Applications Medicaid, Personal Care Services  [Encouraged]  Exercise Interventions   Exercise  Discussed/Reviewed Exercise Discussed, Assistive device use and maintanence, Exercise Reviewed, Physical Activity  [Encouraged]  Physical Activity Discussed/Reviewed Physical Activity Discussed, Home Exercise Program (HEP), Physical Activity Reviewed, Types of exercise  [Encouraged]  Weight Management Weight maintenance  [Encouraged]  Education Interventions   Education Provided Provided Therapist, sports, Provided Web-based Education, Provided Education  Provided Verbal Education On Nutrition, Mental Health/Coping with Illness, When to see the doctor, Foot Care, Eye Care, Labs, Blood Sugar Monitoring, Applications, Exercise, Medication, Development worker, community, [employer]  [Encouraged]  Labs Reviewed Hgb A1c  [Encouraged]  Applications Medicaid, Personal Care Services  [Encouraged]  Mental Health Interventions   Mental Health Discussed/Reviewed Mental Health Discussed, Anxiety, Depression, Mental Health Reviewed, Grief and Loss, Coping Strategies, Substance Abuse, Crisis, Suicide, Other  [Domestic Violence]  Nutrition Interventions   Nutrition Discussed/Reviewed Nutrition Discussed, Adding fruits and vegetables, Increaing proteins, Decreasing fats, Decreasing salt, Nutrition Reviewed, Fluid intake, Carbohydrate meal planning, Portion sizes, Decreasing sugar intake  [Encouraged]  Pharmacy Interventions   Pharmacy Dicussed/Reviewed Pharmacy Topics Discussed, Pharmacy Topics Reviewed, Medication Adherence, Affording Medications  [Encouraged]  Safety Interventions   Safety Discussed/Reviewed Safety Discussed, Safety Reviewed, Fall Risk, Home Safety  [Encouraged]  Home Safety Assistive Devices, Need for home safety assessment, Refer for community resources  [Encouraged]  Advanced Directive Interventions   Advanced Directives Discussed/Reviewed Advanced Directives Discussed  [Advance Directives in Place]      Active Listening & Reflection Utilized.  Verbalization of Feelings Encouraged.   Emotional Support Provided. Diminished Symptoms of Stress, Anxiety, & Fatigue Validated. Feelings of Hopefulness Acknowledged. Problem Solving Interventions Employed. Task-Centered [employer].   Solution-Focused Strategies Activated. Acceptance & Commitment Therapy Implemented. Cognitive Behavioral Therapy Initiated. Client-Centered Therapy Indicated. CSW Collaboration with Patient & Wife, Iago Cottingham to Encourage Increased  Level of Activity & Exercise, as Tolerated. CSW Collaboration with Patient & Wife, Quirino Renna to [employer] of Medications, Exactly as Prescribed. CSW Collaboration with Patient & Wife, Elgie Hansing to [employer] in Self-Enrollment with Psychiatrist in [facility], from List Provided, to Receive Psychotropic Medication Administration & Management, in An Effort to Reduce & Manage Symptoms of Anxiety & Depression. CSW Collaboration with Patient & Wife, Yoshiro Eichorst to [employer] in Self-Enrollment with Therapist in [facility], from List Provided, to Receive Psychotherapeutic Counseling & Supportive Services, in An Effort to Reduce & Manage Symptoms of Anxiety & Depression. CSW Collaboration with Patient & Wife, Baylon Salb to Confirm Interest in Continuing to Receive Psychotherapeutic Counseling & Supportive Services through [place], [place] with [facility] [phone]), in An Effort to Reduce & Manage Symptoms of Anxiety & Depression. CSW Collaboration with Patient & Wife, Prescott Mcconnell to Encourage Attendance at Follow-Up Appointment with Randall An, Cardiology Physician Assistant with [facility] [phone]), Scheduled on 12/31/2022 at 1:00 PM. CSW Collaboration with Patient & Wife, Delio Shnayder to Encourage Attendance at Follow-Up Appointment for Annual Wellness Visit at [facility] (# [id number].),  Scheduled on 01/07/2023 at 11:30 AM. CSW Collaboration with Patient & Wife, Deyton Fenerty to [employer] with CSW [phone]), If They Have Questions, Need Assistance, or If Additional Social Work Needs Are Identified Between Now & Our Next Scheduled Follow-Up Outreach Call.      SDOH assessments and interventions completed:  Yes.  Care Coordination Interventions:  Yes, provided.   Follow up plan: Follow up call scheduled for 01/14/2023 at 2:30 pm.   Encounter Outcome:  Pt. Visit Completed.   [place] Bad, BSW, MSW, LCSW  Licensed Restaurant manager, [employer] [facility]  Mailing [place] [address], [place], [place] [address] Physical Address-[address] [place], [place] [address] Toll Free Main # [phone] Fax # [phone] Cell # [phone] [email or web address]

## 2022-12-24 NOTE — Patient Instructions (Signed)
Visit Information  Thank you for taking time to visit with me today. Please don't hesitate to contact me if I can be of assistance to you.   Following are the goals we discussed today:   Goals Addressed               This Visit's Progress     Obtain Counseling Resources and Supportive Services. (pt-stated)   On track     Care Coordination Interventions:  Interventions Today    Flowsheet Row Most Recent Value  Chronic Disease   Chronic disease during today's visit Congestive Heart Failure (CHF), Hypertension (HTN), Diabetes, Other  [Inability to Perform Activities of Daily Living Independently, Generalized Weakness, Anxiety & Depression]  General Interventions   General Interventions Discussed/Reviewed General Interventions Discussed, Labs, Vaccines, Doctor Visits, Annual Foot Exam, Health Screening, General Interventions Reviewed, Annual Eye Exam, Durable Medical Equipment (DME), Community Resources, Level of Care, Communication with  [Encouraged]  Labs Hgb A1c every 3 months  [Encouraged]  Vaccines COVID-19, Flu, Pneumonia, RSV, Shingles, Tetanus/Pertussis/Diphtheria  [Encouraged]  Doctor Visits Discussed/Reviewed Doctor Visits Discussed, Doctor Visits Reviewed, Annual Wellness Visits, PCP, Specialist  [Encouraged]  Health Screening Bone Density, Colonoscopy, Prostate  [Encouraged]  Durable Medical Equipment (DME) Wheelchair, BP Cuff  Wheelchair Standard  PCP/Specialist Visits Compliance with follow-up visit  [Encouraged]  Communication with PCP/Specialists, RN  [Encouraged]  Level of Care Adult Daycare, Personal Care Services, Applications, Assisted Living, Skilled Nursing Facility  [Encouraged]  Applications Medicaid, Personal Care Services  [Encouraged]  Exercise Interventions   Exercise Discussed/Reviewed Exercise Discussed, Assistive device use and maintanence, Exercise Reviewed, Physical Activity  [Encouraged]  Physical Activity Discussed/Reviewed Physical Activity  Discussed, Home Exercise Program (HEP), Physical Activity Reviewed, Types of exercise  [Encouraged]  Weight Management Weight maintenance  [Encouraged]  Education Interventions   Education Provided Provided Therapist, sports, Provided Web-based Education, Provided Education  Provided Verbal Education On Nutrition, Mental Health/Coping with Illness, When to see the doctor, Foot Care, Eye Care, Labs, Blood Sugar Monitoring, Applications, Exercise, Medication, Development worker, community, [employer]  [Encouraged]  Labs Reviewed Hgb A1c  [Encouraged]  Applications Medicaid, Personal Care Services  [Encouraged]  Mental Health Interventions   Mental Health Discussed/Reviewed Mental Health Discussed, Anxiety, Depression, Mental Health Reviewed, Grief and Loss, Coping Strategies, Substance Abuse, Crisis, Suicide, Other  [Domestic Violence]  Nutrition Interventions   Nutrition Discussed/Reviewed Nutrition Discussed, Adding fruits and vegetables, Increaing proteins, Decreasing fats, Decreasing salt, Nutrition Reviewed, Fluid intake, Carbohydrate meal planning, Portion sizes, Decreasing sugar intake  [Encouraged]  Pharmacy Interventions   Pharmacy Dicussed/Reviewed Pharmacy Topics Discussed, Pharmacy Topics Reviewed, Medication Adherence, Affording Medications  [Encouraged]  Safety Interventions   Safety Discussed/Reviewed Safety Discussed, Safety Reviewed, Fall Risk, Home Safety  [Encouraged]  Home Safety Assistive Devices, Need for home safety assessment, Refer for community resources  [Encouraged]  Advanced Directive Interventions   Advanced Directives Discussed/Reviewed Advanced Directives Discussed  [Advance Directives in Place]      Active Listening & Reflection Utilized.  Verbalization of Feelings Encouraged.  Emotional Support Provided. Diminished Symptoms of Stress, Anxiety, & Fatigue Validated. Feelings of Hopefulness Acknowledged. Problem Solving Interventions Employed. Task-Centered  [employer].   Solution-Focused Strategies Activated. Acceptance & Commitment Therapy Implemented. Cognitive Behavioral Therapy Initiated. Client-Centered Therapy Indicated. CSW Collaboration with Patient & Wife, Emmanuell Selfe to Encourage Increased Level of Activity & Exercise, as Tolerated. CSW Collaboration with Patient & Wife, Kieffer Adi to [employer] of Medications, Exactly as Prescribed. CSW Collaboration with Patient & Wife, Garron Galeana to [employer]  in Self-Enrollment with Psychiatrist in [facility], from List Provided, to Receive Psychotropic Medication Administration & Management, in An Effort to Reduce & Manage Symptoms of Anxiety & Depression. CSW Collaboration with Patient & Wife, Waylon Vandruff to [employer] in Self-Enrollment with Therapist in [facility], from List Provided, to Receive Psychotherapeutic Counseling & Supportive Services, in An Effort to Reduce & Manage Symptoms of Anxiety & Depression. CSW Collaboration with Patient & Wife, Ivy Plymel to Confirm Interest in Continuing to Receive Psychotherapeutic Counseling & Supportive Services through [place], [place] with [facility] [phone]), in An Effort to Reduce & Manage Symptoms of Anxiety & Depression. CSW Collaboration with Patient & Wife, Nefi Melrose to Encourage Attendance at Follow-Up Appointment with Randall An, Cardiology Physician Assistant with [facility] at [facility] [phone]), Scheduled on 12/31/2022 at 1:00 PM. CSW Collaboration with Patient & Wife, Jaysean Cossin to Encourage Attendance at Follow-Up Appointment for Annual Wellness Visit at [facility] (# [phone].), Scheduled on 01/07/2023 at 11:30 AM. CSW Collaboration with Patient & Wife, Saige Angell to [employer] with CSW [phone]), If They Have Questions, Need Assistance, or If  Additional Social Work Needs Are Identified Between Now & Our Next Scheduled Follow-Up Outreach Call.      Our next appointment is by telephone on 01/14/2023 at 2:30 pm.   Please call the care guide team at [phone] if you need to cancel or reschedule your appointment.   If you are experiencing a Mental Health or Behavioral Health Crisis or need someone to talk to, please call the Suicide and Crisis Lifeline: 988 call the [place] National Suicide Prevention Lifeline: 2183078948 or TTY: 671-315-2621 TTY (714)621-5857) to talk to a trained counselor call 1-800-273-TALK (toll free, 24 hour hotline) go to [facility] [address], [place] [phone]) call the [facility] Crisis Line: [phone] call 911  Patient verbalizes understanding of instructions and care plan provided today and agrees to view in MyChart. Active MyChart status and patient understanding of how to access instructions and care plan via MyChart confirmed with patient.     Telephone follow up appointment with care management team member scheduled for:  01/14/2023 at 2:30 pm.   Danford Bad, BSW, MSW, LCSW  Licensed Clinical Social Worker  [employer] [facility] [facility]  Mailing [place]. [address] [place], [place] [address] Physical Address-[address] [place], [place] [address] Toll Free Main # [phone] Fax # [phone] Cell # [phone] [email or web address]

## 2022-12-25 DIAGNOSIS — G720 Drug-induced myopathy: Secondary | ICD-10-CM | POA: Diagnosis not present

## 2022-12-25 DIAGNOSIS — F32A Depression, unspecified: Secondary | ICD-10-CM | POA: Diagnosis not present

## 2022-12-25 DIAGNOSIS — I70209 Unspecified atherosclerosis of native arteries of extremities, unspecified extremity: Secondary | ICD-10-CM | POA: Diagnosis not present

## 2022-12-25 DIAGNOSIS — E1122 Type 2 diabetes mellitus with diabetic chronic kidney disease: Secondary | ICD-10-CM | POA: Diagnosis not present

## 2022-12-25 DIAGNOSIS — Z87891 Personal history of nicotine dependence: Secondary | ICD-10-CM | POA: Diagnosis not present

## 2022-12-25 DIAGNOSIS — E1142 Type 2 diabetes mellitus with diabetic polyneuropathy: Secondary | ICD-10-CM | POA: Diagnosis not present

## 2022-12-25 DIAGNOSIS — L97818 Non-pressure chronic ulcer of other part of right lower leg with other specified severity: Secondary | ICD-10-CM | POA: Diagnosis not present

## 2022-12-25 DIAGNOSIS — I252 Old myocardial infarction: Secondary | ICD-10-CM | POA: Diagnosis not present

## 2022-12-25 DIAGNOSIS — D509 Iron deficiency anemia, unspecified: Secondary | ICD-10-CM | POA: Diagnosis not present

## 2022-12-25 DIAGNOSIS — I5023 Acute on chronic systolic (congestive) heart failure: Secondary | ICD-10-CM | POA: Diagnosis not present

## 2022-12-25 DIAGNOSIS — I480 Paroxysmal atrial fibrillation: Secondary | ICD-10-CM | POA: Diagnosis not present

## 2022-12-25 DIAGNOSIS — T466X5D Adverse effect of antihyperlipidemic and antiarteriosclerotic drugs, subsequent encounter: Secondary | ICD-10-CM | POA: Diagnosis not present

## 2022-12-25 DIAGNOSIS — I872 Venous insufficiency (chronic) (peripheral): Secondary | ICD-10-CM | POA: Diagnosis not present

## 2022-12-25 DIAGNOSIS — E1151 Type 2 diabetes mellitus with diabetic peripheral angiopathy without gangrene: Secondary | ICD-10-CM | POA: Diagnosis not present

## 2022-12-25 DIAGNOSIS — L97828 Non-pressure chronic ulcer of other part of left lower leg with other specified severity: Secondary | ICD-10-CM | POA: Diagnosis not present

## 2022-12-25 DIAGNOSIS — I2510 Atherosclerotic heart disease of native coronary artery without angina pectoris: Secondary | ICD-10-CM | POA: Diagnosis not present

## 2022-12-25 DIAGNOSIS — E785 Hyperlipidemia, unspecified: Secondary | ICD-10-CM | POA: Diagnosis not present

## 2022-12-25 DIAGNOSIS — E1159 Type 2 diabetes mellitus with other circulatory complications: Secondary | ICD-10-CM | POA: Diagnosis not present

## 2022-12-25 DIAGNOSIS — E1169 Type 2 diabetes mellitus with other specified complication: Secondary | ICD-10-CM | POA: Diagnosis not present

## 2022-12-25 DIAGNOSIS — K219 Gastro-esophageal reflux disease without esophagitis: Secondary | ICD-10-CM | POA: Diagnosis not present

## 2022-12-25 DIAGNOSIS — I951 Orthostatic hypotension: Secondary | ICD-10-CM | POA: Diagnosis not present

## 2022-12-25 DIAGNOSIS — N189 Chronic kidney disease, unspecified: Secondary | ICD-10-CM | POA: Diagnosis not present

## 2022-12-25 DIAGNOSIS — I152 Hypertension secondary to endocrine disorders: Secondary | ICD-10-CM | POA: Diagnosis not present

## 2022-12-31 ENCOUNTER — Encounter: Payer: Self-pay | Admitting: Student

## 2022-12-31 ENCOUNTER — Ambulatory Visit: Payer: Medicare Other | Attending: Student | Admitting: Student

## 2022-12-31 VITALS — BP 98/62 | HR 71 | Ht 75.0 in | Wt 273.0 lb

## 2022-12-31 DIAGNOSIS — N1832 Chronic kidney disease, stage 3b: Secondary | ICD-10-CM | POA: Diagnosis not present

## 2022-12-31 DIAGNOSIS — I5022 Chronic systolic (congestive) heart failure: Secondary | ICD-10-CM | POA: Diagnosis not present

## 2022-12-31 DIAGNOSIS — I739 Peripheral vascular disease, unspecified: Secondary | ICD-10-CM

## 2022-12-31 DIAGNOSIS — E785 Hyperlipidemia, unspecified: Secondary | ICD-10-CM | POA: Diagnosis not present

## 2022-12-31 DIAGNOSIS — I480 Paroxysmal atrial fibrillation: Secondary | ICD-10-CM

## 2022-12-31 DIAGNOSIS — I2510 Atherosclerotic heart disease of native coronary artery without angina pectoris: Secondary | ICD-10-CM | POA: Diagnosis not present

## 2022-12-31 NOTE — Progress Notes (Signed)
Cardiology Office Note    Date:  12/31/2022  ID:  [name], DOB May 05, 1951, MRN [id number] Cardiologist: Charlton Haws, MD    History of Present Illness:    [name] [name] is a 72 y.o. male  with past medical history of CAD (s/p DES to RCA in 2010, cath in 07/2016 showing CTO of RCA with collaterals, did receive DESx2 to LCx and OM1, repeat cath in 01/2018 showing patent stents along LCx and OM with CTO of D2, CTO of distal LCx, and CTO of RCA with collaterals present overall unchanged since 2018, cath in 05/2020 with restenosis of proximal LCx and OM1 treated with 2 overlapping DES, cath in 07/2021 showing no options for PCI and medical management recommended), paroxysmal atrial fibrillation, carotid artery stenosis, PAD (s/p right SFA stenting in 03/2019 and left fem-pop in 11/2020), HTN, HLD (intolerant to statins and Repatha), HFmrEF (EF 45-50% in 12/2021 and 40-45% in 08/2022), mitral regurgitation (moderate to severe by TEE in 01/2022), Stage 3 CKD, IDDM and prior CVA who presents to the office today for 38-month follow-up.  He was last examined by myself in 09/2022 following a recent hospitalization for SIRS and an acute CHF exacerbation. At the time of follow-up, he was being followed by home health for lower extremity wounds but reported his breathing had been stable and his weight had declined by over 20 pounds and was down to 273 lbs on his home scales.  We discussed a follow-up echocardiogram for reassessment of his moderate pericardial effusion but he declined at that time and was recommended to review at his next visit.  He was continued on Toprol-XL 50 mg daily but was not a candidate for ACE-I/ARB/ARNI/MRA given his renal dysfunction and was not on SGLT2 inhibitor given recurrent cellulitis. BP also did not allow for Hydralazine or Nitrates. He overall expressed that he was not interested in further testing or hospitalizations and we reviewed that he would likely benefit from Palliative  Care and he planned to review this with his PCP.  In talking with the patient and his wife today, he reports things have overall been stable since his last office visit.  He does have nausea in the morning hours or if he consumes a large meal. We reviewed this is possibly due to the use of Mounjaro and he will discuss with his PCP.  He denies any specific exertional chest pain. Does have occasional palpitations and takes an extra Lopressor if needed but only has to do this on occasion.  No specific orthopnea or PND. Does experience baseline dyspnea on exertion and lower extremity edema. Says that his weight was previously stable on his home scales but he has not weighed himself recently. Has been taking Lasix 60mg  daily.   Studies Reviewed:   EKG: EKG is not ordered today.  LHC: 07/2021   Prox LAD lesion is 40% stenosed.   Mid Cx to Dist Cx lesion is 100% stenosed.   Prox RCA to Mid RCA lesion is 90% stenosed.   Prox RCA lesion is 99% stenosed.   Ost 1st Diag lesion is 40% stenosed.   Ost 2nd Diag to 2nd Diag lesion is 100% stenosed.   Previously placed Ost 1st Mrg stent (unknown type) is  widely patent.   Previously placed Prox Cx stent (unknown type) is  widely patent.   1. Moderate mid LAD stenosis unchanged. Chronic occlusion Diagonal 1. Severe disease small diagonal 2.  2. Patent Circumflex/OM stents. Chronic occlusion of the mid  Circumflex beyond the takeoff of the obtuse marginal branch. This branch fills from left to left collaterals.  3. Chronic occlusion mid RCA in old stented segment. The distal vessel fills briskly from left to right collaterals.  4. LVEDP=28 mmHg   Recommendations: Continue medical management of CAD. Ok to proceed with planned vascular surgery. Resume Eliquis tomorrow. I will ask him to begin lasix 40 mg daily.   Echocardiogram: 08/2022 IMPRESSIONS     1. Left ventricular ejection fraction, by estimation, is 40 to 45%. The  left ventricle has mildly  decreased function. The left ventricle  demonstrates global hypokinesis. There is moderate left ventricular  hypertrophy. Left ventricular diastolic  parameters are indeterminate.   2. Right ventricular systolic function is low normal. The right  ventricular size is normal.   3. Left atrial size was moderately dilated.   4. Moderate pericardial effusion. The pericardial effusion is  circumferential. There is no evidence of cardiac tamponade.   5. The mitral valve is abnormal. Mild mitral valve regurgitation. No  evidence of mitral stenosis.   6. The tricuspid valve is abnormal.   7. The aortic valve has an indeterminant number of cusps. There is mild  calcification of the aortic valve. There is mild thickening of the aortic  valve. Aortic valve regurgitation is not visualized. No aortic stenosis is  present.   Risk Assessment/Calculations:    CHA2DS2-VASc Score = 5   This indicates a 7.2% annual risk of stroke. The patient's score is based upon: CHF History: 1 HTN History: 1 Diabetes History: 1 Stroke History: 0 Vascular Disease History: 1 Age Score: 1 Gender Score: 0    Physical Exam:   VS:  BP 98/62 (BP Location: Right Arm, Patient Position: Sitting, Cuff Size: Large)   Pulse 71   Ht 6\' 3"  (1.905 m)   Wt 273 lb (123.8 kg)   SpO2 93%   BMI 34.12 kg/m    Wt Readings from Last 3 Encounters:  12/31/22 273 lb (123.8 kg)  10/26/22 289 lb (131.1 kg)  09/25/22 273 lb (123.8 kg)     GEN: Pleasant, elderly male appearing in no acute distress. Sitting in wheelchair.  NECK: No JVD; No carotid bruits CARDIAC: RRR, no murmurs, rubs, gallops RESPIRATORY:  Clear to auscultation without rales, wheezing or rhonchi  ABDOMEN: Appears non-distended. No obvious abdominal masses. EXTREMITIES: No clubbing or cyanosis. Chronic appearing edema with dressings in place.  Distal pedal pulses are 2+ bilaterally.   Assessment and Plan:   1. CAD - He has a complex CAD history as discussed  above and most recent catheterization in 2023 showed no options for PCI and medical management was recommended at that time.  - He has baseline dyspnea on exertion but denies any exertional chest pain resembling his prior angina. Continue current medical therapy with Plavix 75 mg daily, Zetia 10 mg daily, Fenofibrate 160 mg daily, Imdur 30 mg daily and Toprol-XL 50 mg daily.  2. Chronic HFmrEF/Pericardial Effusion - His EF was reduced at 40 to 45% by most recent imaging in 08/2022 and he was noted to have a moderate pericardial effusion at that time. He declined a repeat echocardiogram at the time of his last office visit. We again discussed a follow-up echocardiogram today but he again declines as he does not wish to undergo further testing or hospitalizations. - Given the improvement in his volume status, will continue Lasix 60 mg daily. He does have an Rx for as needed Metolazone but has not needed  this recently. He has not been on an SGLT2 inhibitor given recurrent wounds and high infection risk. Remains on Toprol-XL 50 mg daily. Unable to add ACE-I/ARB/ARNI/MRA given his BP.   3. Paroxysmal Atrial Fibrillation - He reports occasional palpitations but no persistent symptoms. Continue Toprol-XL 50 mg daily. Unable to further titrate this given his BP at 98/62 during today's visit. He is aware to take Lopressor as needed for breakthrough episodes. - He is on Eliquis 5 mg twice daily for anticoagulation which is the appropriate dose at this time given his age, weight and renal function. CBC in 10/2022 showed his hemoglobin was stable at 11.0 with platelets of 265 K.   4. HLD - His LDL was 82 in 02/2022. He has been intolerant to multiple statins and remains on Zetia 10 mg daily and Fenofibrate 160 mg daily. Has not been interested in alternative options.   5. Stage 3 CKD - Baseline creatinine 1.8 - 1.9. This had improved to 1.56 by most recent labs in 10/2022.  6. PAD - He is s/p right SFA  stenting in 03/2019 and left fem-pop in 11/2020. Activity is limited but he denies any specific claudication. Remains on Plavix 75 mg daily Zetia 10 mg daily. Previously intolerant to statin therapy.   Signed, [name] Lennox, PA-C

## 2022-12-31 NOTE — Patient Instructions (Signed)
Medication Instructions:  Your physician recommends that you continue on your current medications as directed. Please refer to the Current Medication list given to you today.  *If you need a refill on your cardiac medications before your next appointment, please call your pharmacy*   Lab Work: NONE   If you have labs (blood work) drawn today and your tests are completely normal, you will receive your results only by: MyChart Message (if you have MyChart) OR A paper copy in the mail If you have any lab test that is abnormal or we need to change your treatment, we will call you to review the results.   Testing/Procedures: NONE    Follow-Up: At [facility], you and your health needs are our priority.  As part of our continuing mission to provide you with exceptional heart care, we have created designated Provider Care Teams.  These Care Teams include your primary Cardiologist (physician) and Advanced Practice Providers (APPs -  Physician Assistants and Nurse Practitioners) who all work together to provide you with the care you need, when you need it.  We recommend signing up for the patient portal called "MyChart".  Sign up information is provided on this After Visit Summary.  MyChart is used to connect with patients for Virtual Visits (Telemedicine).  Patients are able to view lab/test results, encounter notes, upcoming appointments, etc.  Non-urgent messages can be sent to your provider as well.   To learn more about what you can do with MyChart, go to [email or web address].    Your next appointment:   3 -4 month(s)  Provider:   You may see Charlton Haws, MD or one of the following Advanced Practice Providers on your designated Care Team:   Randall An, PA-C  Jacolyn Reedy, PA-C     Other Instructions Thank you for choosing [facility]!

## 2023-01-02 ENCOUNTER — Other Ambulatory Visit: Payer: Self-pay | Admitting: Family

## 2023-01-02 DIAGNOSIS — I480 Paroxysmal atrial fibrillation: Secondary | ICD-10-CM

## 2023-01-07 ENCOUNTER — Ambulatory Visit (INDEPENDENT_AMBULATORY_CARE_PROVIDER_SITE_OTHER): Payer: Medicare Other

## 2023-01-07 VITALS — Ht 74.0 in | Wt 273.0 lb

## 2023-01-07 DIAGNOSIS — I252 Old myocardial infarction: Secondary | ICD-10-CM | POA: Diagnosis not present

## 2023-01-07 DIAGNOSIS — E1159 Type 2 diabetes mellitus with other circulatory complications: Secondary | ICD-10-CM | POA: Diagnosis not present

## 2023-01-07 DIAGNOSIS — E785 Hyperlipidemia, unspecified: Secondary | ICD-10-CM | POA: Diagnosis not present

## 2023-01-07 DIAGNOSIS — I70209 Unspecified atherosclerosis of native arteries of extremities, unspecified extremity: Secondary | ICD-10-CM | POA: Diagnosis not present

## 2023-01-07 DIAGNOSIS — I872 Venous insufficiency (chronic) (peripheral): Secondary | ICD-10-CM | POA: Diagnosis not present

## 2023-01-07 DIAGNOSIS — G720 Drug-induced myopathy: Secondary | ICD-10-CM | POA: Diagnosis not present

## 2023-01-07 DIAGNOSIS — I152 Hypertension secondary to endocrine disorders: Secondary | ICD-10-CM | POA: Diagnosis not present

## 2023-01-07 DIAGNOSIS — E1169 Type 2 diabetes mellitus with other specified complication: Secondary | ICD-10-CM | POA: Diagnosis not present

## 2023-01-07 DIAGNOSIS — N189 Chronic kidney disease, unspecified: Secondary | ICD-10-CM | POA: Diagnosis not present

## 2023-01-07 DIAGNOSIS — I5023 Acute on chronic systolic (congestive) heart failure: Secondary | ICD-10-CM | POA: Diagnosis not present

## 2023-01-07 DIAGNOSIS — L97828 Non-pressure chronic ulcer of other part of left lower leg with other specified severity: Secondary | ICD-10-CM | POA: Diagnosis not present

## 2023-01-07 DIAGNOSIS — E1122 Type 2 diabetes mellitus with diabetic chronic kidney disease: Secondary | ICD-10-CM | POA: Diagnosis not present

## 2023-01-07 DIAGNOSIS — Z0000 Encounter for general adult medical examination without abnormal findings: Secondary | ICD-10-CM | POA: Diagnosis not present

## 2023-01-07 DIAGNOSIS — I480 Paroxysmal atrial fibrillation: Secondary | ICD-10-CM | POA: Diagnosis not present

## 2023-01-07 DIAGNOSIS — D509 Iron deficiency anemia, unspecified: Secondary | ICD-10-CM | POA: Diagnosis not present

## 2023-01-07 DIAGNOSIS — T466X5D Adverse effect of antihyperlipidemic and antiarteriosclerotic drugs, subsequent encounter: Secondary | ICD-10-CM | POA: Diagnosis not present

## 2023-01-07 DIAGNOSIS — I951 Orthostatic hypotension: Secondary | ICD-10-CM | POA: Diagnosis not present

## 2023-01-07 DIAGNOSIS — K219 Gastro-esophageal reflux disease without esophagitis: Secondary | ICD-10-CM | POA: Diagnosis not present

## 2023-01-07 DIAGNOSIS — F32A Depression, unspecified: Secondary | ICD-10-CM | POA: Diagnosis not present

## 2023-01-07 DIAGNOSIS — E1142 Type 2 diabetes mellitus with diabetic polyneuropathy: Secondary | ICD-10-CM | POA: Diagnosis not present

## 2023-01-07 DIAGNOSIS — Z87891 Personal history of nicotine dependence: Secondary | ICD-10-CM | POA: Diagnosis not present

## 2023-01-07 DIAGNOSIS — I2510 Atherosclerotic heart disease of native coronary artery without angina pectoris: Secondary | ICD-10-CM | POA: Diagnosis not present

## 2023-01-07 DIAGNOSIS — E1151 Type 2 diabetes mellitus with diabetic peripheral angiopathy without gangrene: Secondary | ICD-10-CM | POA: Diagnosis not present

## 2023-01-07 DIAGNOSIS — L97818 Non-pressure chronic ulcer of other part of right lower leg with other specified severity: Secondary | ICD-10-CM | POA: Diagnosis not present

## 2023-01-07 NOTE — Patient Instructions (Signed)
[name] [name] , Thank you for taking time to come for your Medicare Wellness Visit. I appreciate your ongoing commitment to your health goals. Please review the following plan we discussed and let me know if I can assist you in the future.   Referrals/Orders/Follow-Ups/Clinician Recommendations: Aim for 30 minutes of exercise or brisk walking, 6-8 glasses of water, and 5 servings of fruits and vegetables each day.   This is a list of the screening recommended for you and due dates:  Health Maintenance  Topic Date Due   Pneumonia Vaccine (1 of 2 - PCV) Never done   COVID-19 Vaccine (3 - Moderna risk series) 09/19/2019   Yearly kidney health urinalysis for diabetes  02/21/2022   Complete foot exam   06/23/2022   Eye exam for diabetics  07/01/2022   Screening for Lung Cancer  12/18/2022   Flu Shot  12/17/2022   Zoster (Shingles) Vaccine (1 of 2) 01/26/2023*   Hemoglobin A1C  04/27/2023   Yearly kidney function blood test for diabetes  10/26/2023   Medicare Annual Wellness Visit  01/07/2024   Colon Cancer Screening  09/30/2024   DTaP/Tdap/Td vaccine (2 - Td or Tdap) 09/16/2025   Hepatitis C Screening  Completed   HPV Vaccine  Aged Out  *Topic was postponed. The date shown is not the original due date.    Advanced directives: (Copy Requested) Please bring a copy of your health care power of attorney and living will to the office to be added to your chart at your convenience.  Next Medicare Annual Wellness Visit scheduled for next year: Yes  insert Preventive Care attachment Insert FALL PREVENTION attachment if needed

## 2023-01-07 NOTE — Progress Notes (Signed)
Subjective:   [name] [name] is a 72 y.o. male who presents for Medicare Annual/Subsequent preventive examination.  Visit Complete: Virtual  I connected with  [name] [name] on 01/07/23 by a audio enabled telemedicine application and verified that I am speaking with the correct person using two identifiers.  Patient Location: Home  Provider Location: Home Office  I discussed the limitations of evaluation and management by telemedicine. The patient expressed understanding and agreed to proceed.  Patient Medicare AWV questionnaire was completed by the patient on 01/07/2023; I have confirmed that all information answered by patient is correct and no changes since this date.  Review of Systems    Vital Signs: Unable to obtain new vitals due to this being a telehealth visit.  Cardiac Risk Factors include: advanced age (>73men, >97 women);diabetes mellitus;dyslipidemia;male gender;hypertension Nutrition Risk Assessment:  Has the patient had any N/V/D within the last 2 months?  No  Does the patient have any non-healing wounds?  Yes  Has the patient had any unintentional weight loss or weight gain?  No   Diabetes:  Is the patient diabetic?  Yes  If diabetic, was a CBG obtained today?  No  Did the patient bring in their glucometer from home?  No  How often do you monitor your CBG's? Once a week .   Financial Strains and Diabetes Management:  Are you having any financial strains with the device, your supplies or your medication? No .  Does the patient want to be seen by Chronic Care Management for management of their diabetes?  No  Would the patient like to be referred to a Nutritionist or for Diabetic Management?  No   Diabetic Exams:  Diabetic Eye Exam: Completed 10/2022 Diabetic Foot Exam: Overdue, Pt has been advised about the importance in completing this exam. Pt is scheduled for diabetic foot exam on next office visit .     Objective:    Today's Vitals   01/07/23 1135   Weight: 273 lb (123.8 kg)  Height: 6\' 2"  (1.88 m)   Body mass index is 35.05 kg/m.     01/07/2023   11:41 AM 09/23/2022   12:01 PM 09/22/2022    5:55 PM 09/11/2022    4:08 PM 02/16/2022   10:07 AM 01/26/2022    5:00 AM 01/21/2022    6:42 PM  Advanced Directives  Does Patient Have a Medical Advance Directive? Yes Yes No Yes Yes Yes Yes  Type of Estate agent of [place];Living will Healthcare Power of [place];Living will  Healthcare Power of [place];Living will Living will Living will   Does patient want to make changes to medical advance directive?  No - Patient declined  No - Patient declined  No - Patient declined   Copy of Healthcare Power of Attorney in Chart? No - copy requested No - copy requested  No - copy requested     Would patient like information on creating a medical advance directive?  No - Patient declined  No - Patient declined       Current Medications (verified) Outpatient Encounter Medications as of 01/07/2023  Medication Sig   albuterol (VENTOLIN HFA) 108 (90 Base) MCG/ACT inhaler Inhale 2 puffs into the lungs every 6 (six) hours as needed for wheezing or shortness of breath.   apixaban (ELIQUIS) 5 MG TABS tablet TAKE 1 TABLET BY MOUTH TWICE DAILY   Calcium Carbonate Antacid (ALKA-SELTZER ANTACID PO) Take 1 tablet by mouth 2 (two) times daily as needed (heartburn,  indigestion).   clopidogrel (PLAVIX) 75 MG tablet TAKE 1 TABLET BY MOUTH DAILY   DULoxetine (CYMBALTA) 60 MG capsule Take 1 capsule (60 mg total) by mouth daily.   ezetimibe (ZETIA) 10 MG tablet TAKE 1 TABLET BY MOUTH DAILY   fenofibrate 160 MG tablet TAKE 1 TABLET BY MOUTH DAILY   FEROSUL 325 (65 Fe) MG tablet Take 325 mg by mouth every morning.   gabapentin (NEURONTIN) 300 MG capsule TAKE (3) CAPSULES BY MOUTH TWICE DAILY   HYDROcodone-acetaminophen (NORCO) 10-325 MG tablet Take 1 tablet by mouth every 6 (six) hours as needed for moderate pain.   insulin degludec (TRESIBA FLEXTOUCH)  200 UNIT/ML FlexTouch Pen Inject 10 Units into the skin at bedtime.   insulin lispro (HUMALOG) 100 UNIT/ML KwikPen INJECT 15-20 UNITS SUBCUTANEOUSLY THREE TIMES A DAY WITH MEALS   insulin NPH-regular Human (HUMULIN 70/30) (70-30) 100 UNIT/ML injection INJECT 60 UNITS SUBCUTANEOUSLY TWICE DAILY WITH MEALS   isosorbide mononitrate (IMDUR) 30 MG 24 hr tablet TAKE 1 TABLET BY MOUTH DAILY   linaclotide (LINZESS) 145 MCG CAPS capsule Take 1 capsule (145 mcg total) by mouth daily before breakfast.   Menthol, Topical Analgesic, (BLUE-EMU MAXIMUM STRENGTH EX) Apply 1 application  topically daily as needed (pain).   metolazone (ZAROXOLYN) 5 MG tablet Take 5 mg by mouth as needed (for edema).   metoprolol succinate (TOPROL-XL) 50 MG 24 hr tablet TAKE 1 TABLET BY MOUTH DAILY   metoprolol tartrate (LOPRESSOR) 50 MG tablet Take 50 mg by mouth daily as needed (atrial fibrillation).   nitroGLYCERIN (NITROSTAT) 0.4 MG SL tablet DISSOLVE ONE TABLET UNDER THE TONGUE EVERY 5 MINUTES AS NEEDED FOR CHEST PAIN.  DO NOT EXCEED A TOTAL OF 3 DOSES IN 15 MINUTES Strength: 0.4 mg   pantoprazole (PROTONIX) 40 MG tablet TAKE 1 TABLET BY MOUTH DAILY   tirzepatide (MOUNJARO) 10 MG/0.5ML Pen Inject 10 mg into the skin once a week.   TRUEPLUS INSULIN SYRINGE 28G X 1/2" 1 ML MISC USE TO GIVE INSULIN FIVE TIMES DAILY   furosemide (LASIX) 40 MG tablet Take 1 tablet (40 mg total) by mouth daily. May take an extra 20 mg (1/2 tablet) for weight gain or leg swelling   No facility-administered encounter medications on file as of 01/07/2023.    Allergies (verified) Shellfish allergy, Sulfa antibiotics, Ace inhibitors, Invokana [canagliflozin], Lexapro [escitalopram], Metformin and related, Pravachol [pravastatin sodium], Repatha [evolocumab], Tricor [fenofibrate], Zestril [lisinopril], Farxiga [dapagliflozin], Crestor [rosuvastatin], Horse-derived products, Lipitor [atorvastatin], Livalo [pitavastatin], Milk (cow), and Tape    History: Past Medical History:  Diagnosis Date   Anxiety    Arthritis    Atrial fibrillation (HCC)    CAD (coronary artery disease)    a. 2010: DES to CTO of RCA. EF 55% b. 07/2016: cath showing total occlusion within previously placed RCA stent (collaterals present), severe stenosis along LCx and OM1 (treated with 2 overlapping DES). c. repeat cath in 01/2018 showing patent stents along LCx and OM with CTO of D2, CTO of distal LCx, and CTO of RCA with collaterals present overall unchanged since 2018 with medical management recom   Cellulitis and abscess rt groin    Complication of anesthesia    " I woke up during a colonoscopy "      Depression    Diabetes mellitus    Diastolic CHF (HCC)    Disorders of iron metabolism    Dysrhythmia    Fibromyalgia    GERD (gastroesophageal reflux disease)    History of hiatal  hernia    Hyperlipidemia    Hypertension    Low serum testosterone level    Medically noncompliant    Myocardial infarction [facility])    05-23-20   Pneumonia    Sepsis (HCC)    2023   Past Surgical History:  Procedure Laterality Date   ABDOMINAL AORTOGRAM W/LOWER EXTREMITY N/A 03/21/2019   Procedure: ABDOMINAL AORTOGRAM W/LOWER EXTREMITY;  Surgeon: Nada Libman, MD;  [facility];  Service: Cardiovascular;  Laterality: N/A;   ABDOMINAL AORTOGRAM W/LOWER EXTREMITY N/A 11/12/2020   Procedure: ABDOMINAL AORTOGRAM W/LOWER EXTREMITY;  Surgeon: Nada Libman, MD;  [facility];  Service: Cardiovascular;  Laterality: N/A;   ABDOMINAL AORTOGRAM W/LOWER EXTREMITY N/A 01/28/2021   Procedure: ABDOMINAL AORTOGRAM W/LOWER EXTREMITY;  Surgeon: Nada Libman, MD;  [facility];  Service: Cardiovascular;  Laterality: N/A;   ABDOMINAL AORTOGRAM W/LOWER EXTREMITY Bilateral 07/29/2021   Procedure: ABDOMINAL AORTOGRAM W/LOWER EXTREMITY;  Surgeon: Nada Libman, MD;  [facility];  Service: Cardiovascular;  Laterality:  Bilateral;   ANGIOPLASTY N/A 08/15/2021   Procedure: ATTEMPTED RIGHT PERONEAL  ANGIOPLASTY, LEFT PERONEAL ANGIOPLASTY;  Surgeon: Nada Libman, MD;  [facility];  Service: Vascular;  Laterality: N/A;   BACK SURGERY  2015   ACDF by Dr. Charm Rings STUDY  12/23/2021   Procedure: BUBBLE STUDY;  Surgeon: Parke Poisson, MD;  Location: [facility] ENDOSCOPY;  Service: Cardiology;;   COLONOSCOPY N/A 10/01/2014   Dr. Jena Gauss: multiple tubular adenomas removed, colonic diverticulosis, redundant colon. next tcs advised for 09/2017. PATIENT NEEDS PROPOFOL FOR FAILED CONSCIOUS SEDATION   CORONARY STENT INTERVENTION N/A 07/30/2016   Procedure: Coronary Stent Intervention;  Surgeon: Tonny Bollman, MD;  Location[facility] INVASIVE CV LAB;  Service: Cardiovascular;  Laterality: N/A;   CORONARY STENT PLACEMENT  2000   By Dr. Juanda Chance   EP IMPLANTABLE DEVICE N/A 05/25/2016   Procedure: Loop Recorder Insertion;  Surgeon: Marinus Maw, MD;  Location: [facility] INVASIVE CV LAB;  Service: Cardiovascular;  Laterality: N/A;   ESOPHAGOGASTRODUODENOSCOPY     esophagus [facility] at [facility]   ESOPHAGOGASTRODUODENOSCOPY N/A 10/01/2014   Dr. Jena Gauss: patchy mottling/erythema and minimal polypoid appearance of gastric mucosa. bx with mild inlammation but no H.pylori   FEMORAL-POPLITEAL BYPASS GRAFT Left 11/22/2020   Procedure: LEFT FEMORAL-POPLITEAL BYPASS GRAFT;  Surgeon: Nada Libman, MD;  [facility];  Service: Vascular;  Laterality: Left;   FEMORAL-POPLITEAL BYPASS GRAFT Left 08/15/2021   Procedure: REDO LEFT FEMORAL-POPLITEAL BYPASS USING PROPATEN GRAFT;  Surgeon: Nada Libman, MD;  [facility];  Service: Vascular;  Laterality: Left;   HERNIA REPAIR  2000   umbilical   INSERTION OF ILIAC STENT Right 11/22/2020   Procedure: INSERTION OF ELUVIA STENT INTO RIGHT DISTAL SUPERFICIAL FEMORAL ARTERY;  Surgeon: Nada Libman, MD;  [facility];  Service: Vascular;  Laterality: Right;   LEFT HEART CATH AND CORONARY  ANGIOGRAPHY N/A 07/30/2016   Procedure: Left Heart Cath and Coronary Angiography;  Surgeon: Tonny Bollman, MD;  Location: [facility] INVASIVE CV LAB;  Service: Cardiovascular;  Laterality: N/A;   LEFT HEART CATH AND CORONARY ANGIOGRAPHY N/A 01/19/2018   Procedure: LEFT HEART CATH AND CORONARY ANGIOGRAPHY;  Surgeon: Lennette Bihari, MD;  [facility];  Service: Cardiovascular;  Laterality: N/A;   LEFT HEART CATH AND CORONARY ANGIOGRAPHY N/A 05/24/2020   Procedure: LEFT HEART CATH AND CORONARY ANGIOGRAPHY;  Surgeon: Kathleene Hazel, MD;  [facility];  Service: Cardiovascular;  Laterality: N/A;   LEFT HEART CATH AND CORONARY ANGIOGRAPHY N/A 08/06/2021   Procedure: LEFT HEART CATH AND CORONARY ANGIOGRAPHY;  Surgeon: Kathleene Hazel, MD;  [facility];  Service: Cardiovascular;  Laterality: N/A;   LESION REMOVAL     Lip and hand    LOWER EXTREMITY ANGIOGRAM Right 11/22/2020   Procedure: RIGHT LEG ANGIOGRAM;  Surgeon: Nada Libman, MD;  [facility];  Service: Vascular;  Laterality: Right;   LOWER EXTREMITY ANGIOGRAM Right 08/15/2021   Procedure: RIGHT LOWER EXTREMITY ANGIOGRAM;  Surgeon: Nada Libman, MD;  [facility];  Service: Vascular;  Laterality: Right;   LOWER EXTREMITY ANGIOGRAPHY N/A 04/18/2019   Procedure: LOWER EXTREMITY ANGIOGRAPHY;  Surgeon: Nada Libman, MD;  [facility];  Service: Cardiovascular;  Laterality: N/A;   LOWER EXTREMITY ANGIOGRAPHY N/A 08/18/2021   Procedure: Lower Extremity Angiography;  Surgeon: Maeola Harman, MD;  Location: [facility] INVASIVE CV LAB;  Service: Cardiovascular;  Laterality: N/A;   NECK SURGERY     PERIPHERAL VASCULAR BALLOON ANGIOPLASTY  04/18/2019   Procedure: PERIPHERAL VASCULAR BALLOON ANGIOPLASTY;  Surgeon: Nada Libman, MD;  [facility];  Service: Cardiovascular;;   PERIPHERAL VASCULAR BALLOON ANGIOPLASTY Left 11/12/2020   Procedure: PERIPHERAL  VASCULAR BALLOON ANGIOPLASTY;  Surgeon: Nada Libman, MD;  [facility];  Service: Cardiovascular;  Laterality: Left;  Failed PTA of superficial femoral artery.   PERIPHERAL VASCULAR BALLOON ANGIOPLASTY Right 08/18/2021   Procedure: PERIPHERAL VASCULAR BALLOON ANGIOPLASTY;  Surgeon: Maeola Harman, MD;  Location: [facility] INVASIVE CV LAB;  Service: Cardiovascular;  Laterality: Right;  peroneal   PERIPHERAL VASCULAR INTERVENTION Right 03/21/2019   Procedure: PERIPHERAL VASCULAR INTERVENTION;  Surgeon: Nada Libman, MD;  [facility];  Service: Cardiovascular;  Laterality: Right;  superficial femoral   PERIPHERAL VASCULAR INTERVENTION Right 08/18/2021   Procedure: PERIPHERAL VASCULAR INTERVENTION;  Surgeon: Maeola Harman, MD;  Location: [facility] INVASIVE CV LAB;  Service: Cardiovascular;  Laterality: Right;  tibial peroneal trunk   TEE WITHOUT CARDIOVERSION N/A 12/23/2021   Procedure: TRANSESOPHAGEAL ECHOCARDIOGRAM (TEE);  Surgeon: Parke Poisson, MD;  Location: [facility] ENDOSCOPY;  Service: Cardiology;  Laterality: N/A;   Family History  Problem Relation Age of Onset   Diabetes Father    Valvular heart disease Father    Arthritis Father    Heart disease Father    Stroke Father    Alzheimer's disease Mother    Hyperlipidemia Mother    Hypertension Mother    Arthritis Mother    Lung cancer Mother    Stroke Mother    Headache Mother    Arthritis/Rheumatoid Sister    Diabetes Sister    Hypertension Sister    Hyperlipidemia Sister    Depression Sister    Dementia Maternal Aunt    Dementia Maternal Uncle    Heart disease Maternal Uncle    Stomach cancer Paternal Uncle    Colon cancer Neg Hx    Liver disease Neg Hx    Social History   Socioeconomic History   Marital status: Married    Spouse name: Sugar Lulgjuraj   Number of children: 2   Years of education: 12   Highest education level: 12th grade  Occupational History   Occupation:  retired  Tobacco Use   Smoking status: Former    Current packs/day: 0.00    Average packs/day: 0.3 packs/day for  51.0 years (12.8 ttl pk-yrs)    Types: Cigarettes    Start date: 08/14/1965    Quit date: 08/14/2016    Years since quitting: 6.4    Passive exposure: Past   Smokeless tobacco: Never  Vaping Use   Vaping status: Never Used  Substance and Sexual Activity   Alcohol use: Not Currently   Drug use: No   Sexual activity: Yes    Partners: Female  Other Topics Concern   Not on file  Social History Narrative   Lives with his wife.  Retired.  Unable to afford expensive medicines.     He has 2 children from previous marriage - they live in [place]       Caffeine: 1 cup of 1/2 caff coffee in AM, drinks more if at a restaurant    Like fishing and working outside   [employer] Strain: Low Risk  (01/07/2023)   Overall Financial Resource Strain (CARDIA)    Difficulty of Paying Living Expenses: Not hard at all  Food Insecurity: No Food Insecurity (01/07/2023)   Hunger Vital Sign    Worried About Running Out of Food in the Last Year: Never true    Ran Out of Food in the Last Year: Never true  Transportation Needs: No Transportation Needs (01/07/2023)   PRAPARE - Administrator, Civil Service (Medical): No    Lack of Transportation (Non-Medical): No  Physical Activity: Insufficiently Active (01/07/2023)   Exercise Vital Sign    Days of Exercise per Week: 2 days    Minutes of Exercise per Session: 20 min  Stress: No Stress Concern Present (01/07/2023)   [employer] of Occupational Health - Occupational Stress Questionnaire    Feeling of Stress : Not at all  Social Connections: Moderately Integrated (01/07/2023)   Social Connection and Isolation Panel [NHANES]    Frequency of Communication with Friends and Family: More than three times a week    Frequency of Social Gatherings with Friends and Family: More than three times a  week    Attends Religious Services: More than 4 times per year    Active Member of [employer] or Organizations: No    Attends Engineer, structural: Never    Marital Status: Married    Tobacco Counseling Counseling given: Not Answered   Clinical Intake:  Pre-visit preparation completed: Yes  Pain : No/denies pain     Nutritional Risks: None Diabetes: Yes CBG done?: No Did pt. bring in CBG monitor from home?: No  How often do you need to have someone help you when you read instructions, pamphlets, or other written materials from your doctor or pharmacy?: 1 - Never  Interpreter Needed?: No  Information entered by :: Renie Ora, LPN   Activities of Daily Living    01/07/2023   11:41 AM 09/23/2022   12:03 PM  In your present state of health, do you have any difficulty performing the following activities:  Hearing? 0 1  Comment  Hard of Hearing  Vision? 0 0  Difficulty concentrating or making decisions? 0 0  Walking or climbing stairs? 0 1  Comment  Unsteady Balance/Gait  Dressing or bathing? 0 1  Comment  Unsteady Balance/Gait  Doing errands, shopping? 0 1  Comment  Unsteady Balance/Gait  Preparing Food and eating ? N Y  Comment  Wife Assists  Using the Toilet? N Y  Comment  Wife Assists  In the past six months, have you  accidently leaked urine? N N  Do you have problems with loss of bowel control? N N  Managing your Medications? N Y  Comment  Wife Assists  Managing your Finances? N Y  Comment  Wife Assists  Housekeeping or managing your Housekeeping? [place]  Comment  Wife Assists    Patient Care Team: Junie Spencer, FNP as PCP - General (Family Medicine) Marinus Maw, MD as PCP - Electrophysiology (Cardiology) Wendall Stade, MD as PCP - Cardiology (Cardiology) Nada Libman, MD as Consulting Physician (Vascular Surgery) Jena Gauss Gerrit Friends, MD as Consulting Physician (Gastroenterology) Anson Fret, MD as Consulting Physician  (Neurology) Danella Maiers, [facility] (Pharmacist) Peggye Pitt, MD (Pain Medicine) Randa Lynn, MD as Consulting Physician (Nephrology) Delora Fuel, OD (Optometry) Ramseur, Erick Alley, PTA as Physical Therapy Assistant (Orthopedic Surgery) Clinton Gallant, RN as [facility] Care Management [place], Fanny Dance, LCSW as [facility] Care Management (Licensed Clinical Social Worker)  Indicate any recent Medical Services you may have received from other than [facility] providers in the past year (date may be approximate).     Assessment:   This is a routine wellness examination for [place].  Hearing/Vision screen Vision Screening - Comments:: Wears rx glasses - up to date with routine eye exams with  Dr.johnson   Dietary issues and exercise activities discussed:     Goals Addressed             This Visit's Progress    DIET - EAT MORE FRUITS AND VEGETABLES   On track      Depression Screen    01/07/2023   11:40 AM 10/26/2022   11:42 AM 09/23/2022   11:55 AM 09/22/2022    3:09 PM 06/16/2022    8:55 AM 03/16/2022    8:28 AM 02/16/2022   10:35 AM  PHQ 2/9 Scores  PHQ - 2 Score 0 2 2 2 1 1 1   PHQ- 9 Score  7 6 9 15 18      Fall Risk    01/07/2023   11:38 AM 10/26/2022   11:41 AM 09/23/2022   11:49 AM 07/08/2022   12:08 PM 06/16/2022    8:54 AM  Fall Risk   Falls in the past year? 0 1 1 1 1   Number falls in past yr: 0 1 1 1 1   Injury with Fall? 0 1 1 1 1   Risk for fall due to : No Fall Risks History of fall(s);Impaired balance/gait;Impaired mobility History of fall(s);Impaired balance/gait;Impaired mobility;Medication side effect History of fall(s);Impaired balance/gait;Impaired mobility History of fall(s)  Follow up Falls prevention discussed Falls evaluation completed;Education provided Falls evaluation completed;Education provided;Falls prevention discussed;Follow up appointment Falls evaluation completed;Falls prevention discussed Falls evaluation  completed;Education provided    MEDICARE RISK AT HOME: Medicare Risk at Home Any stairs in or around the home?: No If so, are there any without handrails?: No Home free of loose throw rugs in walkways, pet beds, electrical cords, etc?: Yes Adequate lighting in your home to reduce risk of falls?: Yes Life alert?: No Use of a cane, walker or w/c?: Yes Grab bars in the bathroom?: Yes Shower chair or bench in shower?: Yes Elevated toilet seat or a handicapped toilet?: Yes  TIMED UP AND GO:  Was the test performed?  No    Cognitive Function:    03/19/2022    8:34 AM 12/22/2017    8:51 AM 12/22/2017    8:44 AM 12/15/2016    9:52 AM  12/11/2015    8:51 AM  MMSE - Mini Mental State Exam  Orientation to time 4 5 5 5 5   Orientation to Place 4 5 5 5 5   Registration 3 3   3   Attention/ Calculation 5 5  5 5   Recall 2 3   3   Language- name 2 objects 2 2  2 2   Language- repeat 1 1   1   Language- follow 3 step command 3 3   3   Language- read & follow direction 1 1   1   Write a sentence 1 1   1   Copy design 1 1   1   Total score 27 30   30         01/07/2023   11:41 AM 01/07/2022   10:33 AM 01/06/2021    9:18 AM 01/05/2020    8:26 AM 01/04/2019    8:38 AM  6CIT Screen  What Year? 0 points  0 points 0 points 0 points  What month? 0 points  0 points 0 points 0 points  What time? 0 points 0 points 0 points 0 points 0 points  Count back from 20 0 points 0 points 0 points  0 points  Months in reverse 0 points 0 points 0 points 0 points 0 points  Repeat phrase 0 points 0 points 2 points 0 points 2 points  Total Score 0 points  2 points  2 points    Immunizations Immunization History  Administered Date(s) Administered   Fluad Quad(high Dose 65+) 02/26/2016   Influenza,inj,Quad PF,6+ Mos 02/26/2016   Influenza,inj,quad, With Preservative 02/26/2016   Moderna Sars-Covid-2 Vaccination 07/20/2019, 08/22/2019   Tdap 09/17/2015    TDAP status: Up to date  Flu Vaccine status: Declined,  Education has been provided regarding the importance of this vaccine but patient still declined. Advised may receive this vaccine at local pharmacy or Health Dept. Aware to provide a copy of the vaccination record if obtained from local pharmacy or Health Dept. Verbalized acceptance and understanding.  Pneumococcal vaccine status: Declined,  Education has been provided regarding the importance of this vaccine but patient still declined. Advised may receive this vaccine at local pharmacy or Health Dept. Aware to provide a copy of the vaccination record if obtained from local pharmacy or Health Dept. Verbalized acceptance and understanding.   Covid-19 vaccine status: Completed vaccines  Qualifies for Shingles Vaccine? Yes   Zostavax completed No   Shingrix Completed?: No.    Education has been provided regarding the importance of this vaccine. Patient has been advised to call insurance company to determine out of pocket expense if they have not yet received this vaccine. Advised may also receive vaccine at local pharmacy or Health Dept. Verbalized acceptance and understanding.  Screening Tests Health Maintenance  Topic Date Due   Pneumonia Vaccine 80+ Years old (1 of 2 - PCV) Never done   COVID-19 Vaccine (3 - Moderna risk series) 09/19/2019   Diabetic kidney evaluation - Urine ACR  02/21/2022   FOOT EXAM  06/23/2022   OPHTHALMOLOGY EXAM  07/01/2022   Lung Cancer Screening  12/18/2022   INFLUENZA VACCINE  12/17/2022   Zoster Vaccines- Shingrix (1 of 2) 01/26/2023 (Originally 10/11/1969)   HEMOGLOBIN A1C  04/27/2023   Diabetic kidney evaluation - eGFR measurement  10/26/2023   Medicare Annual Wellness (AWV)  01/07/2024   Colonoscopy  09/30/2024   DTaP/Tdap/Td (2 - Td or Tdap) 09/16/2025   Hepatitis C Screening  Completed   HPV VACCINES  Aged  Out    Health Maintenance  Health Maintenance Due  Topic Date Due   Pneumonia Vaccine 68+ Years old (1 of 2 - PCV) Never done   COVID-19 Vaccine  (3 - Moderna risk series) 09/19/2019   Diabetic kidney evaluation - Urine ACR  02/21/2022   FOOT EXAM  06/23/2022   OPHTHALMOLOGY EXAM  07/01/2022   Lung Cancer Screening  12/18/2022   INFLUENZA VACCINE  12/17/2022    Colorectal cancer screening: Type of screening: Colonoscopy. Completed 10/01/2014. Repeat every 10 years  Lung Cancer Screening: (Low Dose CT Chest recommended if Age 45-80 years, 20 pack-year currently smoking OR have quit w/in 15years.) does qualify.   Lung Cancer Screening Referral: declined   Additional Screening:  Hepatitis C Screening: does not qualify; Completed 01/22/2022  Vision Screening: Recommended annual ophthalmology exams for early detection of glaucoma and other disorders of the eye. Is the patient up to date with their annual eye exam?  Yes  Who is the provider or what is the name of the office in which the patient attends annual eye exams? Dr.Johnson  If pt is not established with a provider, would they like to be referred to a provider to establish care? No .   Dental Screening: Recommended annual dental exams for proper oral hygiene   Community Resource Referral / Chronic Care Management: CRR required this visit?  No   CCM required this visit?  No     Plan:     I have personally reviewed and noted the following in the patient's chart:   Medical and social history Use of alcohol, tobacco or illicit drugs  Current medications and supplements including opioid prescriptions. Patient is not currently taking opioid prescriptions. Functional ability and status Nutritional status Physical activity Advanced directives List of other physicians Hospitalizations, surgeries, and ER visits in previous 12 months Vitals Screenings to include cognitive, depression, and falls Referrals and appointments  In addition, I have reviewed and discussed with patient certain preventive protocols, quality metrics, and best practice recommendations. A written  personalized care plan for preventive services as well as general preventive health recommendations were provided to patient.     Lorrene Reid, LPN   1/61/0960   After Visit Summary: (MyChart) Due to this being a telephonic visit, the after visit summary with patients personalized plan was offered to patient via MyChart   Nurse Notes: none

## 2023-01-14 ENCOUNTER — Encounter: Payer: Self-pay | Admitting: *Deleted

## 2023-01-14 ENCOUNTER — Ambulatory Visit: Payer: Self-pay | Admitting: *Deleted

## 2023-01-14 NOTE — Patient Outreach (Signed)
Care Coordination   Follow Up Visit Note   01/14/2023  Name: [name] [name] MRN: [id number] DOB: May 24, 1950  [name] [name] is a 72 y.o. year old male who sees [place], Edilia Bo, FNP for primary care. I spoke with [name] [name] and wife, [name] [name] by phone today.  What matters to the patients health and wellness today?  Obtain Counseling Resources and Cytogeneticist.   Goals Addressed               This Visit's Progress     Obtain Counseling Resources and Supportive Services. (pt-stated)   On track     Care Coordination Interventions:  Interventions Today    Flowsheet Row Most Recent Value  Chronic Disease   Chronic disease during today's visit Congestive Heart Failure (CHF), Hypertension (HTN), Diabetes, Other  [Inability to Perform Activities of Daily Living Independently, Generalized Weakness, Anxiety & Depression]  General Interventions   General Interventions Discussed/Reviewed General Interventions Discussed, Labs, Vaccines, Doctor Visits, Annual Foot Exam, Health Screening, General Interventions Reviewed, Annual Eye Exam, Durable Medical Equipment (DME), Community Resources, Level of Care, Communication with  [Encouraged]  Labs Hgb A1c every 3 months  [Encouraged]  Vaccines COVID-19, Flu, Pneumonia, RSV, Shingles, Tetanus/Pertussis/Diphtheria  [Encouraged]  Doctor Visits Discussed/Reviewed Doctor Visits Discussed, Doctor Visits Reviewed, Annual Wellness Visits, PCP, Specialist  [Encouraged]  Health Screening Bone Density, Colonoscopy, Prostate  [Encouraged]  Durable Medical Equipment (DME) Wheelchair, BP Cuff  Wheelchair Standard  PCP/Specialist Visits Compliance with follow-up visit  [Encouraged]  Communication with PCP/Specialists, RN  [Encouraged]  Level of Care Adult Daycare, Personal Care Services, Applications, Assisted Living, Skilled Nursing Facility  [Encouraged]  Applications Medicaid, Personal Care Services  [Encouraged]  Exercise Interventions    Exercise Discussed/Reviewed Exercise Discussed, Assistive device use and maintanence, Exercise Reviewed, Physical Activity  [Encouraged]  Physical Activity Discussed/Reviewed Physical Activity Discussed, Home Exercise Program (HEP), Physical Activity Reviewed, Types of exercise  [Encouraged]  Weight Management Weight maintenance  [Encouraged]  Education Interventions   Education Provided Provided Therapist, sports, Provided Web-based Education, Provided Education  Provided Verbal Education On Nutrition, Mental Health/Coping with Illness, When to see the doctor, Foot Care, Eye Care, Labs, Blood Sugar Monitoring, Applications, Exercise, Medication, Development worker, community, [employer] Resources  [Encouraged]  Labs Reviewed Hgb A1c  [Encouraged]  Applications Medicaid, Personal Care Services  [Encouraged]  Mental Health Interventions   Mental Health Discussed/Reviewed Mental Health Discussed, Anxiety, Depression, Mental Health Reviewed, Grief and Loss, Coping Strategies, Substance Abuse, Crisis, Suicide, Other  [Domestic Violence]  Nutrition Interventions   Nutrition Discussed/Reviewed Nutrition Discussed, Adding fruits and vegetables, Increaing proteins, Decreasing fats, Decreasing salt, Nutrition Reviewed, Fluid intake, Carbohydrate meal planning, Portion sizes, Decreasing sugar intake  [Encouraged]  Pharmacy Interventions   Pharmacy Dicussed/Reviewed Pharmacy Topics Discussed, Pharmacy Topics Reviewed, Medication Adherence, Affording Medications  [Encouraged]  Safety Interventions   Safety Discussed/Reviewed Safety Discussed, Safety Reviewed, Fall Risk, Home Safety  [Encouraged]  Home Safety Assistive Devices, Need for home safety assessment, Refer for community resources  [Encouraged]  Advanced Directive Interventions   Advanced Directives Discussed/Reviewed Advanced Directives Discussed  [Advance Directives in Place]      Active Listening & Reflection Utilized.  Verbalization of Feelings Encouraged.   Emotional Support Provided. Problem Solving Interventions Employed. Task-Centered [employer].   Solution-Focused Strategies Activated. Acceptance & Commitment Therapy Implemented. Cognitive Behavioral Therapy Initiated. Client-Centered Therapy Indicated. CSW Collaboration with Patient & Wife, [name] [name] to Confirm Continued Weekly Home Visits from Skilled Nurse through [facility] [phone]),  to Wrap Feet, Ankles, & Legs. CSW Collaboration with Patient & Wife, [name] [name] to Confirm Interest in Referral to Podiatry, Preferably with Dr. Adam Phenix, Podiatrist at [facility] [phone]). CSW Collaboration with Jannifer Rodney, Family Nurse Practitioner with [facility] [phone]), Via Secure Chat Message in Epic, to Request Order for Podiatry & Referral to Dr. Adam Phenix, Podiatrist at [facility] [phone]), Per Patient & Wife, [name] [name] Wishes. CSW Collaboration with Patient & Wife, [name] [name] to Encourage Attendance at Follow-Up Appointment with Jannifer Rodney, Family Nurse Practitioner with [facility] [phone]), Scheduled on 01/28/2023 at 11:25 AM. CSW Collaboration with Patient & Wife, [name] [name] to [employer] with CSW [phone]), If They Have Questions, Need Assistance, or If Additional Social Work Needs Are Identified Between Now & Our Next Follow-Up Outreach Call, Scheduled on 02/01/2023 at 2:00 PM.      SDOH assessments and interventions completed:  Yes.  Care Coordination Interventions:  Yes, provided.   Follow up plan: Follow up call scheduled for 02/01/2023 at 2:00 pm.  Encounter Outcome:  Pt. Visit Completed.   Danford Bad, BSW, MSW, Printmaker Social Work Case Set designer Health  [facility], Population Health Direct Dial: [phone]  Fax: [phone] Email:  [email or web address]: [email or web address]

## 2023-01-14 NOTE — Patient Instructions (Signed)
Visit Information  Thank you for taking time to visit with me today. Please don't hesitate to contact me if I can be of assistance to you.   Following are the goals we discussed today:   Goals Addressed               This Visit's Progress     Obtain Counseling Resources and Supportive Services. (pt-stated)   On track     Care Coordination Interventions:  Interventions Today    Flowsheet Row Most Recent Value  Chronic Disease   Chronic disease during today's visit Congestive Heart Failure (CHF), Hypertension (HTN), Diabetes, Other  [Inability to Perform Activities of Daily Living Independently, Generalized Weakness, Anxiety & Depression]  General Interventions   General Interventions Discussed/Reviewed General Interventions Discussed, Labs, Vaccines, Doctor Visits, Annual Foot Exam, Health Screening, General Interventions Reviewed, Annual Eye Exam, Durable Medical Equipment (DME), Community Resources, Level of Care, Communication with  [Encouraged]  Labs Hgb A1c every 3 months  [Encouraged]  Vaccines COVID-19, Flu, Pneumonia, RSV, Shingles, Tetanus/Pertussis/Diphtheria  [Encouraged]  Doctor Visits Discussed/Reviewed Doctor Visits Discussed, Doctor Visits Reviewed, Annual Wellness Visits, PCP, Specialist  [Encouraged]  Health Screening Bone Density, Colonoscopy, Prostate  [Encouraged]  Durable Medical Equipment (DME) Wheelchair, BP Cuff  Wheelchair Standard  PCP/Specialist Visits Compliance with follow-up visit  [Encouraged]  Communication with PCP/Specialists, RN  [Encouraged]  Level of Care Adult Daycare, Personal Care Services, Applications, Assisted Living, Skilled Nursing Facility  [Encouraged]  Applications Medicaid, Personal Care Services  [Encouraged]  Exercise Interventions   Exercise Discussed/Reviewed Exercise Discussed, Assistive device use and maintanence, Exercise Reviewed, Physical Activity  [Encouraged]  Physical Activity Discussed/Reviewed Physical Activity  Discussed, Home Exercise Program (HEP), Physical Activity Reviewed, Types of exercise  [Encouraged]  Weight Management Weight maintenance  [Encouraged]  Education Interventions   Education Provided Provided Therapist, sports, Provided Web-based Education, Provided Education  Provided Verbal Education On Nutrition, Mental Health/Coping with Illness, When to see the doctor, Foot Care, Eye Care, Labs, Blood Sugar Monitoring, Applications, Exercise, Medication, Development worker, community, [employer] Resources  [Encouraged]  Labs Reviewed Hgb A1c  [Encouraged]  Applications Medicaid, Personal Care Services  [Encouraged]  Mental Health Interventions   Mental Health Discussed/Reviewed Mental Health Discussed, Anxiety, Depression, Mental Health Reviewed, Grief and Loss, Coping Strategies, Substance Abuse, Crisis, Suicide, Other  [Domestic Violence]  Nutrition Interventions   Nutrition Discussed/Reviewed Nutrition Discussed, Adding fruits and vegetables, Increaing proteins, Decreasing fats, Decreasing salt, Nutrition Reviewed, Fluid intake, Carbohydrate meal planning, Portion sizes, Decreasing sugar intake  [Encouraged]  Pharmacy Interventions   Pharmacy Dicussed/Reviewed Pharmacy Topics Discussed, Pharmacy Topics Reviewed, Medication Adherence, Affording Medications  [Encouraged]  Safety Interventions   Safety Discussed/Reviewed Safety Discussed, Safety Reviewed, Fall Risk, Home Safety  [Encouraged]  Home Safety Assistive Devices, Need for home safety assessment, Refer for community resources  [Encouraged]  Advanced Directive Interventions   Advanced Directives Discussed/Reviewed Advanced Directives Discussed  [Advance Directives in Place]      Active Listening & Reflection Utilized.  Verbalization of Feelings Encouraged.  Emotional Support Provided. Problem Solving Interventions Employed. Task-Centered [employer].   Solution-Focused Strategies Activated. Acceptance & Commitment Therapy  Implemented. Cognitive Behavioral Therapy Initiated. Client-Centered Therapy Indicated. CSW Collaboration with Patient & Wife, Sirius Lumpp to Confirm Continued Weekly Home Visits from Skilled Nurse through [facility] [phone]), to Wrap Feet, Ankles, & Legs. CSW Collaboration with Patient & Wife, Zachariah Stroud to Confirm Interest in Referral to Podiatry, Preferably with Dr. Adam Phenix, Podiatrist at [facility] [phone]). CSW  Collaboration with Jannifer Rodney, Family Nurse Practitioner with [facility] [phone]), Via Secure Chat Message in Epic, to Request Order for Podiatry & Referral to Dr. Adam Phenix, Podiatrist at [facility] [phone]), Per Patient & Wife, Daxston Robtoy Wishes. CSW Collaboration with Patient & Wife, Seeley Letter to Encourage Attendance at Follow-Up Appointment with Jannifer Rodney, Family Nurse Practitioner with [facility] [phone]), Scheduled on 01/28/2023 at 11:25 AM. CSW Collaboration with Patient & Wife, Tarvis Mangels to [employer] with CSW [phone]), If They Have Questions, Need Assistance, or If Additional Social Work Needs Are Identified Between Now & Our Next Follow-Up Outreach Call, Scheduled on 02/01/2023 at 2:00 PM.      Our next appointment is by telephone on 02/01/2023 at 2:00 pm.  Please call the care guide team at [phone] if you need to cancel or reschedule your appointment.   If you are experiencing a Mental Health or Behavioral Health Crisis or need someone to talk to, please call the Suicide and Crisis Lifeline: 988 call the [place] National Suicide Prevention Lifeline: 929-839-8892 or TTY: (332)179-8028 TTY (973)755-1660) to talk to a trained counselor call 1-800-273-TALK (toll free, 24 hour hotline) go to [facility] [address] [place] [phone]) call  the [facility] Crisis Line: ([phone] call 911  Patient verbalizes understanding of instructions and care plan provided today and agrees to view in MyChart. Active MyChart status and patient understanding of how to access instructions and care plan via MyChart confirmed with patient.     Telephone follow up appointment with care management team member scheduled for:  02/01/2023 at 2:00 pm.  Danford Bad, BSW, MSW, LCSW  Embedded Practice Social Work Case Manager  [facility], Population Health Direct Dial: [phone]  Fax: [phone] Email: [email or web address] Website: [email or web address]

## 2023-01-15 DIAGNOSIS — K219 Gastro-esophageal reflux disease without esophagitis: Secondary | ICD-10-CM | POA: Diagnosis not present

## 2023-01-15 DIAGNOSIS — I252 Old myocardial infarction: Secondary | ICD-10-CM | POA: Diagnosis not present

## 2023-01-15 DIAGNOSIS — E785 Hyperlipidemia, unspecified: Secondary | ICD-10-CM | POA: Diagnosis not present

## 2023-01-15 DIAGNOSIS — L97818 Non-pressure chronic ulcer of other part of right lower leg with other specified severity: Secondary | ICD-10-CM | POA: Diagnosis not present

## 2023-01-15 DIAGNOSIS — I152 Hypertension secondary to endocrine disorders: Secondary | ICD-10-CM | POA: Diagnosis not present

## 2023-01-15 DIAGNOSIS — I480 Paroxysmal atrial fibrillation: Secondary | ICD-10-CM | POA: Diagnosis not present

## 2023-01-15 DIAGNOSIS — I70209 Unspecified atherosclerosis of native arteries of extremities, unspecified extremity: Secondary | ICD-10-CM | POA: Diagnosis not present

## 2023-01-15 DIAGNOSIS — E1169 Type 2 diabetes mellitus with other specified complication: Secondary | ICD-10-CM | POA: Diagnosis not present

## 2023-01-15 DIAGNOSIS — Z87891 Personal history of nicotine dependence: Secondary | ICD-10-CM | POA: Diagnosis not present

## 2023-01-15 DIAGNOSIS — T466X5D Adverse effect of antihyperlipidemic and antiarteriosclerotic drugs, subsequent encounter: Secondary | ICD-10-CM | POA: Diagnosis not present

## 2023-01-15 DIAGNOSIS — L97828 Non-pressure chronic ulcer of other part of left lower leg with other specified severity: Secondary | ICD-10-CM | POA: Diagnosis not present

## 2023-01-15 DIAGNOSIS — N189 Chronic kidney disease, unspecified: Secondary | ICD-10-CM | POA: Diagnosis not present

## 2023-01-15 DIAGNOSIS — I2510 Atherosclerotic heart disease of native coronary artery without angina pectoris: Secondary | ICD-10-CM | POA: Diagnosis not present

## 2023-01-15 DIAGNOSIS — E1159 Type 2 diabetes mellitus with other circulatory complications: Secondary | ICD-10-CM | POA: Diagnosis not present

## 2023-01-15 DIAGNOSIS — I5023 Acute on chronic systolic (congestive) heart failure: Secondary | ICD-10-CM | POA: Diagnosis not present

## 2023-01-15 DIAGNOSIS — I872 Venous insufficiency (chronic) (peripheral): Secondary | ICD-10-CM | POA: Diagnosis not present

## 2023-01-15 DIAGNOSIS — E1151 Type 2 diabetes mellitus with diabetic peripheral angiopathy without gangrene: Secondary | ICD-10-CM | POA: Diagnosis not present

## 2023-01-15 DIAGNOSIS — I951 Orthostatic hypotension: Secondary | ICD-10-CM | POA: Diagnosis not present

## 2023-01-15 DIAGNOSIS — E1122 Type 2 diabetes mellitus with diabetic chronic kidney disease: Secondary | ICD-10-CM | POA: Diagnosis not present

## 2023-01-15 DIAGNOSIS — G720 Drug-induced myopathy: Secondary | ICD-10-CM | POA: Diagnosis not present

## 2023-01-15 DIAGNOSIS — E1142 Type 2 diabetes mellitus with diabetic polyneuropathy: Secondary | ICD-10-CM | POA: Diagnosis not present

## 2023-01-15 DIAGNOSIS — D509 Iron deficiency anemia, unspecified: Secondary | ICD-10-CM | POA: Diagnosis not present

## 2023-01-19 ENCOUNTER — Telehealth: Payer: Self-pay | Admitting: Family

## 2023-01-19 NOTE — Telephone Encounter (Signed)
Can put him for an appointment at 1:40pm.  Jannifer Rodney, FNP

## 2023-01-19 NOTE — Telephone Encounter (Signed)
LMTCB [name] [name]

## 2023-01-20 NOTE — Telephone Encounter (Signed)
Called and spoke to patient   appt made

## 2023-01-21 ENCOUNTER — Ambulatory Visit (INDEPENDENT_AMBULATORY_CARE_PROVIDER_SITE_OTHER): Payer: Medicare Other | Admitting: Family

## 2023-01-21 ENCOUNTER — Ambulatory Visit: Payer: Medicare Other

## 2023-01-21 ENCOUNTER — Encounter: Payer: Self-pay | Admitting: Family

## 2023-01-21 VITALS — BP 137/73 | HR 72 | Temp 97.7°F | Ht 74.0 in | Wt 279.0 lb

## 2023-01-21 DIAGNOSIS — G720 Drug-induced myopathy: Secondary | ICD-10-CM | POA: Diagnosis not present

## 2023-01-21 DIAGNOSIS — I152 Hypertension secondary to endocrine disorders: Secondary | ICD-10-CM | POA: Diagnosis not present

## 2023-01-21 DIAGNOSIS — I252 Old myocardial infarction: Secondary | ICD-10-CM

## 2023-01-21 DIAGNOSIS — F419 Anxiety disorder, unspecified: Secondary | ICD-10-CM

## 2023-01-21 DIAGNOSIS — E1159 Type 2 diabetes mellitus with other circulatory complications: Secondary | ICD-10-CM

## 2023-01-21 DIAGNOSIS — M545 Low back pain, unspecified: Secondary | ICD-10-CM | POA: Diagnosis not present

## 2023-01-21 DIAGNOSIS — E1169 Type 2 diabetes mellitus with other specified complication: Secondary | ICD-10-CM | POA: Diagnosis not present

## 2023-01-21 DIAGNOSIS — K219 Gastro-esophageal reflux disease without esophagitis: Secondary | ICD-10-CM

## 2023-01-21 DIAGNOSIS — G8929 Other chronic pain: Secondary | ICD-10-CM

## 2023-01-21 DIAGNOSIS — Z794 Long term (current) use of insulin: Secondary | ICD-10-CM | POA: Diagnosis not present

## 2023-01-21 DIAGNOSIS — Z0001 Encounter for general adult medical examination with abnormal findings: Secondary | ICD-10-CM | POA: Diagnosis not present

## 2023-01-21 DIAGNOSIS — E1142 Type 2 diabetes mellitus with diabetic polyneuropathy: Secondary | ICD-10-CM | POA: Diagnosis not present

## 2023-01-21 DIAGNOSIS — F331 Major depressive disorder, recurrent, moderate: Secondary | ICD-10-CM

## 2023-01-21 DIAGNOSIS — H68133 Extrinsic cartilagenous obstruction of Eustachian tube, bilateral: Secondary | ICD-10-CM

## 2023-01-21 DIAGNOSIS — I480 Paroxysmal atrial fibrillation: Secondary | ICD-10-CM | POA: Diagnosis not present

## 2023-01-21 DIAGNOSIS — Z79899 Other long term (current) drug therapy: Secondary | ICD-10-CM

## 2023-01-21 DIAGNOSIS — Z0000 Encounter for general adult medical examination without abnormal findings: Secondary | ICD-10-CM

## 2023-01-21 MED ORDER — HYDROCODONE-ACETAMINOPHEN 10-325 MG PO TABS
1.0000 | ORAL_TABLET | Freq: Four times a day (QID) | ORAL | 0 refills | Status: DC | PRN
Start: 2023-03-21 — End: 2023-02-02

## 2023-01-21 MED ORDER — GABAPENTIN 300 MG PO CAPS
900.0000 mg | ORAL_CAPSULE | Freq: Three times a day (TID) | ORAL | 3 refills | Status: DC
Start: 2023-01-21 — End: 2023-02-02

## 2023-01-21 MED ORDER — HYDROCODONE-ACETAMINOPHEN 10-325 MG PO TABS
1.0000 | ORAL_TABLET | Freq: Four times a day (QID) | ORAL | 0 refills | Status: DC | PRN
Start: 2023-01-21 — End: 2023-02-02

## 2023-01-21 MED ORDER — HYDROCODONE-ACETAMINOPHEN 10-325 MG PO TABS
1.0000 | ORAL_TABLET | Freq: Four times a day (QID) | ORAL | 0 refills | Status: DC | PRN
Start: 2023-02-18 — End: 2023-02-02

## 2023-01-21 NOTE — Progress Notes (Signed)
Subjective:    Patient ID: [name] [name], male    DOB: 1950/05/30, 72 y.o.   MRN: [id number]  Chief Complaint  Patient presents with   Medical Management of Chronic Issues   Hearing Problem    Right ear is the wrost    Fall   PT presents to the office today for chronic follow up. PT had NSTEMI on 07/30/16 and 05/23/20 and stent placed. Pt is followed by Cardiologists every 4 months for CAD and decreased EF. He had a cath 08/08/21 that was showed no blockage.    He can not tolerate statins related to muscle pain.  He is followed by Ortho for osteoarthritis and was getting injections in bilateral knees and back.    Followed by Nephrologists every 3 months for CKD.    He is followed by Vascular and had stent placed in his right leg and had a left femoral-popliteal  bypass on 11/22/20. He had a abdominal aortogram with lower extremity on 01/28/21.   He is followed by Vascular surgery and has a bypass 08/15/21. He has several vascular wounds on bilateral feet. Home Health is coming once a week, wife is doing dressing changes.    He has atherosclerosis and can not tolerate statins.    He is morbid obese with a BMI of 35 and diabetic and HTN.   Fall The accident occurred 2 days ago. Impact surface: landed on Surveyor, mining. The point of impact was the right elbow and buttocks. The pain is at a severity of 10/10. The pain is moderate. Pertinent negatives include no bowel incontinence, fever, headaches, hearing loss, hematuria or loss of consciousness. He has tried acetaminophen and rest for the symptoms. The treatment provided mild relief.  Hypertension This is a chronic problem. The current episode started more than 1 year ago. The problem has been resolved since onset. The problem is controlled. Associated symptoms include anxiety, blurred vision, malaise/fatigue, peripheral edema and shortness of breath. Pertinent negatives include no headaches. Risk factors for coronary artery disease include  dyslipidemia, diabetes mellitus, obesity, male gender and sedentary lifestyle. The current treatment provides moderate improvement. Hypertensive end-organ damage includes heart failure.  Congestive Heart Failure Presents for follow-up visit. Associated symptoms include edema, fatigue and shortness of breath. The symptoms have been stable.  Gastroesophageal Reflux He complains of belching, heartburn and a hoarse voice. This is a chronic problem. The current episode started more than 1 year ago. The problem occurs occasionally. Associated symptoms include fatigue. Risk factors include obesity. He has tried a PPI for the symptoms. The treatment provided moderate relief.  Hyperlipidemia This is a chronic problem. The current episode started more than 1 year ago. Exacerbating diseases include obesity. Associated symptoms include shortness of breath. Risk factors for coronary artery disease include dyslipidemia, diabetes mellitus, hypertension and a sedentary lifestyle.  Diabetes He presents for his follow-up diabetic visit. He has type 2 diabetes mellitus. Hypoglycemia symptoms include nervousness/anxiousness. Pertinent negatives for hypoglycemia include no headaches. Associated symptoms include blurred vision, fatigue and foot paresthesias. Diabetic complications include heart disease and peripheral neuropathy. Risk factors for coronary artery disease include dyslipidemia, diabetes mellitus, hypertension, male sex and sedentary lifestyle. He is following a generally unhealthy diet. His overall blood glucose range is 110-130 mg/dl. Eye exam is not current.  Back Pain This is a chronic problem. The current episode started more than 1 year ago. The problem occurs intermittently. The problem has been waxing and waning since onset. The pain is present in  the lumbar spine. The quality of the pain is described as aching. The pain is at a severity of 10/10. The pain is moderate. Pertinent negatives include no bowel  incontinence, fever or headaches. He has tried analgesics for the symptoms. The treatment provided moderate relief.  Depression        This is a chronic problem.  The current episode started more than 1 year ago.   The problem occurs intermittently.  Associated symptoms include fatigue, restlessness and sad.  Associated symptoms include no helplessness, no hopelessness and no headaches.  Past treatments include SNRIs - Serotonin and norepinephrine reuptake inhibitors.  Past medical history includes anxiety.   Anxiety Presents for follow-up visit. Symptoms include excessive worry, nervous/anxious behavior, restlessness and shortness of breath. Symptoms occur occasionally. The severity of symptoms is moderate.      Current opioids rx- Norco 10-325 mg # meds rx- 90 Effectiveness of current meds-stable Adverse reactions from pain meds-none Morphine equivalent- 40  Pill count performed-No Last drug screen - 01/21/23 ( high risk q19m, moderate risk q24m, low risk yearly ) Urine drug screen today- Yes Was the NCCSR reviewed- yes  If yes were their any concerning findings? - n/a   Overdose risk: n/a   Pain contract signed on:01/21/23     Review of Systems  Constitutional:  Positive for fatigue and malaise/fatigue. Negative for fever.  HENT:  Positive for hoarse voice.   Eyes:  Positive for blurred vision.  Respiratory:  Positive for shortness of breath.   Gastrointestinal:  Positive for heartburn. Negative for bowel incontinence.  Genitourinary:  Negative for hematuria.  Musculoskeletal:  Positive for back pain.  Neurological:  Negative for loss of consciousness and headaches.  Psychiatric/Behavioral:  Positive for depression. The patient is nervous/anxious.   All other systems reviewed and are negative.      Objective:   Physical Exam Vitals reviewed.  Constitutional:      General: He is not in acute distress.    Appearance: He is well-developed. He is obese.  HENT:     Head:  Normocephalic.     Right Ear: Tympanic membrane normal.     Left Ear: Tympanic membrane normal.  Eyes:     General:        Right eye: No discharge.        Left eye: No discharge.     Pupils: Pupils are equal, round, and reactive to light.  Neck:     Thyroid: No thyromegaly.  Cardiovascular:     Rate and Rhythm: Normal rate and regular rhythm.     Heart sounds: Murmur heard.  Pulmonary:     Effort: Pulmonary effort is normal. No respiratory distress.     Breath sounds: Normal breath sounds. No wheezing.  Abdominal:     General: Bowel sounds are normal. There is no distension.     Palpations: Abdomen is soft.     Tenderness: There is no abdominal tenderness.  Musculoskeletal:        General: No tenderness.     Cervical back: Normal range of motion and neck supple.  Skin:    General: Skin is warm and dry.     Findings: No erythema or rash.     Comments: Bilateral discoloration   Neurological:     Mental Status: He is alert and oriented to person, place, and time.     Cranial Nerves: No cranial nerve deficit.     Motor: Weakness (in wheelchair, generalized weakness) present.  Gait: Gait abnormal.     Deep Tendon Reflexes: Reflexes are normal and symmetric.  Psychiatric:        Behavior: Behavior normal.        Thought Content: Thought content normal.        Judgment: Judgment normal.       BP 137/73   Pulse 72   Temp 97.7 F (36.5 C) (Temporal)   Ht 6\' 2"  (1.88 m)   Wt 279 lb (126.6 kg)   BMI 35.82 kg/m      Assessment & Plan:   [name] [name] comes in today with chief complaint of Medical Management of Chronic Issues, Hearing Problem (Right ear is the wrost ), and Fall   Diagnosis and orders addressed:  1. Type 2 diabetes mellitus with other specified complication, with long-term current use of insulin (HCC) - Bayer DCA Hb A1c Waived - CMP14+EGFR - CBC with Differential/Platelet  2. Diabetic peripheral neuropathy associated with type 2 diabetes mellitus  (HCC) Will increase gabapentin to 900 mg TID prn  - CMP14+EGFR - CBC with Differential/Platelet - gabapentin (NEURONTIN) 300 MG capsule; Take 3 capsules (900 mg total) by mouth 3 (three) times daily.  Dispense: 360 capsule; Refill: 3  3. Gastroesophageal reflux disease without esophagitis - CMP14+EGFR - CBC with Differential/Platelet  4. Statin myopathy - CMP14+EGFR - CBC with Differential/Platelet  5. Hypertension associated with diabetes (HCC) - CMP14+EGFR - CBC with Differential/Platelet  6. Hyperlipidemia associated with type 2 diabetes mellitus (HCC) - CMP14+EGFR - CBC with Differential/Platelet  7. Moderate episode of recurrent major depressive disorder (HCC) - CMP14+EGFR - CBC with Differential/Platelet  8. Annual physical exam - CMP14+EGFR - CBC with Differential/Platelet - Lipid panel - PSA, total and free - TSH  9. PAF (paroxysmal atrial fibrillation) (HCC) - CMP14+EGFR - CBC with Differential/Platelet  10. History of non-ST elevation myocardial infarction (NSTEMI) - CMP14+EGFR - CBC with Differential/Platelet  11. Anxiety - CMP14+EGFR - CBC with Differential/Platelet  12. Chronic bilateral low back pain without sciatica  - HYDROcodone-acetaminophen (NORCO) 10-325 MG tablet; Take 1 tablet by mouth every 6 (six) hours as needed for moderate pain.  Dispense: 90 tablet; Refill: 0 - CMP14+EGFR - CBC with Differential/Platelet - HYDROcodone-acetaminophen (NORCO) 10-325 MG tablet; Take 1 tablet by mouth every 6 (six) hours as needed for moderate pain.  Dispense: 90 tablet; Refill: 0 - HYDROcodone-acetaminophen (NORCO) 10-325 MG tablet; Take 1 tablet by mouth every 6 (six) hours as needed for moderate pain.  Dispense: 90 tablet; Refill: 0 - ToxASSURE Select 13 (MW), Urine  13. Controlled substance agreement signed   14. Extrinsic obstruction of cartilagenous portion of both eustachian tubes Start zyrtec, flonase, and nasal decongestant  If problems  continue will need ENT   Labs pending Continue current medications  Patient reviewed in [place] controlled database, no flags noted. Contract and drug screen are up to date.  Health Maintenance reviewed Diet and exercise encouraged  Follow up plan: 3 months    Jannifer Rodney, FNP

## 2023-01-21 NOTE — Patient Instructions (Signed)
eEustachian Tube Dysfunction  Eustachian tube dysfunction refers to a condition in which a blockage develops in the narrow passage that connects the middle ear to the back of the nose (eustachian tube). The eustachian tube regulates air pressure in the middle ear by letting air move between the ear and nose. It also helps to drain fluid from the middle ear space. Eustachian tube dysfunction can affect one or both ears. When the eustachian tube does not function properly, air pressure, fluid, or both can build up in the middle ear. What are the causes? This condition occurs when the eustachian tube becomes blocked or cannot open normally. Common causes of this condition include: Ear infections. Colds and other infections that affect the nose, mouth, and throat (upper respiratory tract). Allergies. Irritation from cigarette smoke. Irritation from stomach acid coming up into the esophagus (gastroesophageal reflux). The esophagus is the part of the body that moves food from the mouth to the stomach. Sudden changes in air pressure, such as from descending in an airplane or scuba diving. Abnormal growths in the nose or throat, such as: Growths that line the nose (nasal polyps). Abnormal growth of cells (tumors). Enlarged tissue at the back of the throat (adenoids). What increases the risk? You are more likely to develop this condition if: You smoke. You are overweight. You are a child who has: Certain birth defects of the mouth, such as cleft palate. Large tonsils or adenoids. What are the signs or symptoms? Common symptoms of this condition include: A feeling of fullness in the ear. Ear pain. Clicking or popping noises in the ear. Ringing in the ear (tinnitus). Hearing loss. Loss of balance. Dizziness. Symptoms may get worse when the air pressure around you changes, such as when you travel to an area of high elevation, fly on an airplane, or go scuba diving. How is this diagnosed? This  condition may be diagnosed based on: Your symptoms. A physical exam of your ears, nose, and throat. Tests, such as those that measure: The movement of your eardrum. Your hearing (audiometry). How is this treated? Treatment depends on the cause and severity of your condition. In mild cases, you may relieve your symptoms by moving air into your ears. This is called "popping the ears." In more severe cases, or if you have symptoms of fluid in your ears, treatment may include: Medicines to relieve congestion (decongestants). Medicines that treat allergies (antihistamines). Nasal sprays or ear drops that contain medicines that reduce swelling (steroids). A procedure to drain the fluid in your eardrum. In this procedure, a small tube may be placed in the eardrum to: Drain the fluid. Restore the air in the middle ear space. A procedure to insert a balloon device through the nose to inflate the opening of the eustachian tube (balloon dilation). Follow these instructions at home: Lifestyle Do not do any of the following until your health care provider approves: Travel to high altitudes. Fly in airplanes. Work in a Estate agent or room. Scuba dive. Do not use any products that contain nicotine or tobacco. These products include cigarettes, chewing tobacco, and vaping devices, such as e-cigarettes. If you need help quitting, ask your health care provider. Keep your ears dry. Wear fitted earplugs during showering and bathing. Dry your ears completely after. General instructions Take over-the-counter and prescription medicines only as told by your health care provider. Use techniques to help pop your ears as recommended by your health care provider. These may include: Chewing gum. Yawning. Frequent, forceful swallowing.  Closing your mouth, holding your nose closed, and gently blowing as if you are trying to blow air out of your nose. Keep all follow-up visits. This is important. Contact a  health care provider if: Your symptoms do not go away after treatment. Your symptoms come back after treatment. You are unable to pop your ears. You have: A fever. Pain in your ear. Pain in your head or neck. Fluid draining from your ear. Your hearing suddenly changes. You become very dizzy. You lose your balance. Get help right away if: You have a sudden, severe increase in any of your symptoms. Summary Eustachian tube dysfunction refers to a condition in which a blockage develops in the eustachian tube. It can be caused by ear infections, allergies, inhaled irritants, or abnormal growths in the nose or throat. Symptoms may include ear pain or fullness, hearing loss, or ringing in the ears. Mild cases are treated with techniques to unblock the ears, such as yawning or chewing gum. More severe cases are treated with medicines or procedures. This information is not intended to replace advice given to you by your health care provider. Make sure you discuss any questions you have with your health care provider. Document Revised: 07/15/2020 Document Reviewed: 07/15/2020 Elsevier Patient Education  2024 [employer].

## 2023-01-22 DIAGNOSIS — T466X5D Adverse effect of antihyperlipidemic and antiarteriosclerotic drugs, subsequent encounter: Secondary | ICD-10-CM | POA: Diagnosis not present

## 2023-01-22 DIAGNOSIS — I480 Paroxysmal atrial fibrillation: Secondary | ICD-10-CM | POA: Diagnosis not present

## 2023-01-22 DIAGNOSIS — E1151 Type 2 diabetes mellitus with diabetic peripheral angiopathy without gangrene: Secondary | ICD-10-CM | POA: Diagnosis not present

## 2023-01-22 DIAGNOSIS — I2510 Atherosclerotic heart disease of native coronary artery without angina pectoris: Secondary | ICD-10-CM | POA: Diagnosis not present

## 2023-01-22 DIAGNOSIS — Z87891 Personal history of nicotine dependence: Secondary | ICD-10-CM | POA: Diagnosis not present

## 2023-01-22 DIAGNOSIS — I872 Venous insufficiency (chronic) (peripheral): Secondary | ICD-10-CM | POA: Diagnosis not present

## 2023-01-22 DIAGNOSIS — I5023 Acute on chronic systolic (congestive) heart failure: Secondary | ICD-10-CM | POA: Diagnosis not present

## 2023-01-22 DIAGNOSIS — I252 Old myocardial infarction: Secondary | ICD-10-CM | POA: Diagnosis not present

## 2023-01-22 DIAGNOSIS — E1159 Type 2 diabetes mellitus with other circulatory complications: Secondary | ICD-10-CM | POA: Diagnosis not present

## 2023-01-22 DIAGNOSIS — G720 Drug-induced myopathy: Secondary | ICD-10-CM | POA: Diagnosis not present

## 2023-01-22 DIAGNOSIS — E1142 Type 2 diabetes mellitus with diabetic polyneuropathy: Secondary | ICD-10-CM | POA: Diagnosis not present

## 2023-01-22 DIAGNOSIS — E1122 Type 2 diabetes mellitus with diabetic chronic kidney disease: Secondary | ICD-10-CM | POA: Diagnosis not present

## 2023-01-22 DIAGNOSIS — N189 Chronic kidney disease, unspecified: Secondary | ICD-10-CM | POA: Diagnosis not present

## 2023-01-22 DIAGNOSIS — D509 Iron deficiency anemia, unspecified: Secondary | ICD-10-CM | POA: Diagnosis not present

## 2023-01-22 DIAGNOSIS — L97818 Non-pressure chronic ulcer of other part of right lower leg with other specified severity: Secondary | ICD-10-CM | POA: Diagnosis not present

## 2023-01-22 DIAGNOSIS — I951 Orthostatic hypotension: Secondary | ICD-10-CM | POA: Diagnosis not present

## 2023-01-22 DIAGNOSIS — I152 Hypertension secondary to endocrine disorders: Secondary | ICD-10-CM | POA: Diagnosis not present

## 2023-01-22 DIAGNOSIS — E785 Hyperlipidemia, unspecified: Secondary | ICD-10-CM | POA: Diagnosis not present

## 2023-01-22 DIAGNOSIS — L97828 Non-pressure chronic ulcer of other part of left lower leg with other specified severity: Secondary | ICD-10-CM | POA: Diagnosis not present

## 2023-01-22 DIAGNOSIS — E1169 Type 2 diabetes mellitus with other specified complication: Secondary | ICD-10-CM | POA: Diagnosis not present

## 2023-01-22 DIAGNOSIS — I70209 Unspecified atherosclerosis of native arteries of extremities, unspecified extremity: Secondary | ICD-10-CM | POA: Diagnosis not present

## 2023-01-22 DIAGNOSIS — K219 Gastro-esophageal reflux disease without esophagitis: Secondary | ICD-10-CM | POA: Diagnosis not present

## 2023-01-23 LAB — TOXASSURE SELECT 13 (MW), URINE

## 2023-01-27 ENCOUNTER — Other Ambulatory Visit: Payer: Self-pay | Admitting: Family

## 2023-01-27 DIAGNOSIS — I480 Paroxysmal atrial fibrillation: Secondary | ICD-10-CM

## 2023-01-28 ENCOUNTER — Ambulatory Visit (INDEPENDENT_AMBULATORY_CARE_PROVIDER_SITE_OTHER): Payer: Medicare Other | Admitting: Family

## 2023-01-28 ENCOUNTER — Encounter: Payer: Self-pay | Admitting: Family

## 2023-01-28 VITALS — BP 131/82 | HR 78 | Temp 96.5°F | Ht 74.0 in

## 2023-01-28 DIAGNOSIS — H938X3 Other specified disorders of ear, bilateral: Secondary | ICD-10-CM

## 2023-01-28 DIAGNOSIS — E1142 Type 2 diabetes mellitus with diabetic polyneuropathy: Secondary | ICD-10-CM | POA: Diagnosis not present

## 2023-01-28 DIAGNOSIS — H6993 Unspecified Eustachian tube disorder, bilateral: Secondary | ICD-10-CM | POA: Diagnosis not present

## 2023-01-28 DIAGNOSIS — R296 Repeated falls: Secondary | ICD-10-CM

## 2023-01-28 DIAGNOSIS — R531 Weakness: Secondary | ICD-10-CM | POA: Diagnosis not present

## 2023-01-28 DIAGNOSIS — I252 Old myocardial infarction: Secondary | ICD-10-CM | POA: Diagnosis not present

## 2023-01-28 DIAGNOSIS — Z0000 Encounter for general adult medical examination without abnormal findings: Secondary | ICD-10-CM | POA: Diagnosis not present

## 2023-01-28 DIAGNOSIS — D509 Iron deficiency anemia, unspecified: Secondary | ICD-10-CM | POA: Diagnosis not present

## 2023-01-28 DIAGNOSIS — K219 Gastro-esophageal reflux disease without esophagitis: Secondary | ICD-10-CM | POA: Diagnosis not present

## 2023-01-28 DIAGNOSIS — Z794 Long term (current) use of insulin: Secondary | ICD-10-CM

## 2023-01-28 DIAGNOSIS — L97828 Non-pressure chronic ulcer of other part of left lower leg with other specified severity: Secondary | ICD-10-CM | POA: Diagnosis not present

## 2023-01-28 DIAGNOSIS — I5023 Acute on chronic systolic (congestive) heart failure: Secondary | ICD-10-CM | POA: Diagnosis not present

## 2023-01-28 DIAGNOSIS — I2510 Atherosclerotic heart disease of native coronary artery without angina pectoris: Secondary | ICD-10-CM | POA: Diagnosis not present

## 2023-01-28 DIAGNOSIS — N189 Chronic kidney disease, unspecified: Secondary | ICD-10-CM | POA: Diagnosis not present

## 2023-01-28 DIAGNOSIS — E1169 Type 2 diabetes mellitus with other specified complication: Secondary | ICD-10-CM | POA: Diagnosis not present

## 2023-01-28 DIAGNOSIS — E1151 Type 2 diabetes mellitus with diabetic peripheral angiopathy without gangrene: Secondary | ICD-10-CM | POA: Diagnosis not present

## 2023-01-28 DIAGNOSIS — I70209 Unspecified atherosclerosis of native arteries of extremities, unspecified extremity: Secondary | ICD-10-CM | POA: Diagnosis not present

## 2023-01-28 DIAGNOSIS — I480 Paroxysmal atrial fibrillation: Secondary | ICD-10-CM | POA: Diagnosis not present

## 2023-01-28 DIAGNOSIS — E785 Hyperlipidemia, unspecified: Secondary | ICD-10-CM | POA: Diagnosis not present

## 2023-01-28 DIAGNOSIS — E1122 Type 2 diabetes mellitus with diabetic chronic kidney disease: Secondary | ICD-10-CM | POA: Diagnosis not present

## 2023-01-28 DIAGNOSIS — I152 Hypertension secondary to endocrine disorders: Secondary | ICD-10-CM | POA: Diagnosis not present

## 2023-01-28 DIAGNOSIS — Z87891 Personal history of nicotine dependence: Secondary | ICD-10-CM | POA: Diagnosis not present

## 2023-01-28 DIAGNOSIS — E1159 Type 2 diabetes mellitus with other circulatory complications: Secondary | ICD-10-CM | POA: Diagnosis not present

## 2023-01-28 DIAGNOSIS — I951 Orthostatic hypotension: Secondary | ICD-10-CM | POA: Diagnosis not present

## 2023-01-28 DIAGNOSIS — G720 Drug-induced myopathy: Secondary | ICD-10-CM | POA: Diagnosis not present

## 2023-01-28 DIAGNOSIS — L97818 Non-pressure chronic ulcer of other part of right lower leg with other specified severity: Secondary | ICD-10-CM | POA: Diagnosis not present

## 2023-01-28 DIAGNOSIS — T466X5D Adverse effect of antihyperlipidemic and antiarteriosclerotic drugs, subsequent encounter: Secondary | ICD-10-CM | POA: Diagnosis not present

## 2023-01-28 DIAGNOSIS — I872 Venous insufficiency (chronic) (peripheral): Secondary | ICD-10-CM | POA: Diagnosis not present

## 2023-01-28 LAB — BAYER DCA HB A1C WAIVED: HB A1C (BAYER DCA - WAIVED): 7 % — ABNORMAL HIGH (ref 4.8–5.6)

## 2023-01-28 NOTE — Patient Instructions (Signed)
Fall Prevention in the Home, Adult Falls can cause injuries and affect people of all ages. There are many simple things that you can do to make your home safe and to help prevent falls. If you need it, ask for help making these changes. What actions can I take to prevent falls? General information Use good lighting in all rooms. Make sure to: Replace any light bulbs that burn out. Turn on lights if it is dark and use night-lights. Keep items that you use often in easy-to-reach places. Lower the shelves around your home if needed. Move furniture so that there are clear paths around it. Do not keep throw rugs or other things on the floor that can make you trip. If any of your floors are uneven, fix them. Add color or contrast paint or tape to clearly mark and help you see: Grab bars or handrails. First and last steps of staircases. Where the edge of each step is. If you use a ladder or stepladder: Make sure that it is fully opened. Do not climb a closed ladder. Make sure the sides of the ladder are locked in place. Have someone hold the ladder while you use it. Know where your pets are as you move through your home. What can I do in the bathroom?     Keep the floor dry. Clean up any water that is on the floor right away. Remove soap buildup in the bathtub or shower. Buildup makes bathtubs and showers slippery. Use non-skid mats or decals on the floor of the bathtub or shower. Attach bath mats securely with double-sided, non-slip rug tape. If you need to sit down while you are in the shower, use a non-slip stool. Install grab bars by the toilet and in the bathtub and shower. Do not use towel bars as grab bars. What can I do in the bedroom? Make sure that you have a light by your bed that is easy to reach. Do not use any sheets or blankets on your bed that hang to the floor. Have a firm bench or chair with side arms that you can use for support when you get dressed. What can I do in  the kitchen? Clean up any spills right away. If you need to reach something above you, use a sturdy step stool that has a grab bar. Keep electrical cables out of the way. Do not use floor polish or wax that makes floors slippery. What can I do with my stairs? Do not leave anything on the stairs. Make sure that you have a light switch at the top and the bottom of the stairs. Have them installed if you do not have them. Make sure that there are handrails on both sides of the stairs. Fix handrails that are broken or loose. Make sure that handrails are as long as the staircases. Install non-slip stair treads on all stairs in your home if they do not have carpet. Avoid having throw rugs at the top or bottom of stairs, or secure the rugs with carpet tape to prevent them from moving. Choose a carpet design that does not hide the edge of steps on the stairs. Make sure that carpet is firmly attached to the stairs. Fix any carpet that is loose or worn. What can I do on the outside of my home? Use bright outdoor lighting. Repair the edges of walkways and driveways and fix any cracks. Clear paths of anything that can make you trip, such as tools or rocks. Add   color or contrast paint or tape to clearly mark and help you see high doorway thresholds. Trim any bushes or trees on the main path into your home. Check that handrails are securely fastened and in good repair. Both sides of all steps should have handrails. Install guardrails along the edges of any raised decks or porches. Have leaves, snow, and ice cleared regularly. Use sand, salt, or ice melt on walkways during winter months if you live where there is ice and snow. In the garage, clean up any spills right away, including grease or oil spills. What other actions can I take? Review your medicines with your health care provider. Some medicines can make you confused or feel dizzy. This can increase your chance of falling. Wear closed-toe shoes that  fit well and support your feet. Wear shoes that have rubber soles and low heels. Use a cane, walker, scooter, or crutches that help you move around if needed. Talk with your provider about other ways that you can decrease your risk of falls. This may include seeing a physical therapist to learn to do exercises to improve movement and strength. Where to find more information Centers for Disease Control and Prevention, [employer]: cdc.gov National Institute on Aging: nia.nih.gov National Institute on Aging: nia.nih.gov Contact a health care provider if: You are afraid of falling at home. You feel weak, drowsy, or dizzy at home. You fall at home. Get help right away if you: Lose consciousness or have trouble moving after a fall. Have a fall that causes a head injury. These symptoms may be an emergency. Get help right away. Call 911. Do not wait to see if the symptoms will go away. Do not drive yourself to the hospital. This information is not intended to replace advice given to you by your health care provider. Make sure you discuss any questions you have with your health care provider. Document Revised: 01/05/2022 Document Reviewed: 01/05/2022 Elsevier Patient Education  2024 Elsevier Inc.  

## 2023-01-28 NOTE — Progress Notes (Signed)
Subjective:    Patient ID: [name] [name], male    DOB: 04-07-1951, 72 y.o.   MRN: [id number]  Chief Complaint  Patient presents with   Medical Management of Chronic Issues   Pt presents to the office today with complaints of weakness and frequent falls. In the last week he has fallen 3 times. He fell off the lawnmower and hurt his right arm and left hip. He rolled out bed this morning and hit his back and right arm. He fell in his bathroom and landed on his buttocks. Reports his pain is 2 out 10. He taking Norco as needed.  Fall The accident occurred 5 to 7 days ago. Pertinent negatives include no vomiting.  Ear Fullness  There is pain in both ears. The current episode started 1 to 4 weeks ago. The problem has been waxing and waning. Pertinent negatives include no neck pain, rhinorrhea, sore throat or vomiting. He has tried ear drops for the symptoms. The treatment provided mild relief.      Review of Systems  HENT:  Negative for rhinorrhea and sore throat.   Gastrointestinal:  Negative for vomiting.  Musculoskeletal:  Negative for neck pain.  All other systems reviewed and are negative.      Objective:   Physical Exam Vitals reviewed.  Constitutional:      General: He is not in acute distress.    Appearance: He is well-developed. He is obese.  HENT:     Head: Normocephalic.     Right Ear: A middle ear effusion is present. Tympanic membrane is not erythematous.  Eyes:     General:        Right eye: No discharge.        Left eye: No discharge.     Pupils: Pupils are equal, round, and reactive to light.  Neck:     Thyroid: No thyromegaly.  Cardiovascular:     Rate and Rhythm: Normal rate and regular rhythm.     Heart sounds: Normal heart sounds. No murmur heard. Pulmonary:     Effort: Pulmonary effort is normal. No respiratory distress.     Breath sounds: Normal breath sounds. No wheezing.  Abdominal:     General: Bowel sounds are normal. There is no distension.      Palpations: Abdomen is soft.     Tenderness: There is no abdominal tenderness.  Musculoskeletal:        General: No tenderness. Normal range of motion.     Cervical back: Normal range of motion and neck supple.  Skin:    General: Skin is warm and dry.     Findings: No erythema or rash.  Neurological:     Mental Status: He is alert and oriented to person, place, and time.     Cranial Nerves: No cranial nerve deficit.     Motor: Weakness present.     Gait: Gait abnormal.     Deep Tendon Reflexes: Reflexes are normal and symmetric.  Psychiatric:        Behavior: Behavior normal.        Thought Content: Thought content normal.        Judgment: Judgment normal.       BP 131/82   Pulse 78   Temp (!) 96.5 F (35.8 C) (Temporal)   Ht 6\' 2"  (1.88 m)   SpO2 97%   BMI 35.82 kg/m      Assessment & Plan:   [name] [name] comes in today with chief  complaint of Medical Management of Chronic Issues   Diagnosis and orders addressed:  1. Generalized weakness  2. Frequent falls  3. Sensation of fullness in both ears - Ambulatory referral to ENT  4. Eustachian tube dysfunction, bilateral - Ambulatory referral to ENT  5. Type 2 diabetes mellitus with other specified complication, with long-term current use of insulin (HCC) - Microalbumin / creatinine urine ratio   Labs pending Continue current medications  Fall precautions - Continue doing home exercises  Health Maintenance reviewed Diet and exercise encouraged  Follow up plan: Keep chronic follow up in 3 months    Jannifer Rodney, FNP

## 2023-01-29 ENCOUNTER — Telehealth: Payer: Self-pay | Admitting: Family

## 2023-01-29 ENCOUNTER — Other Ambulatory Visit: Payer: Self-pay | Admitting: Family

## 2023-01-29 LAB — CMP14+EGFR
ALT: 11 IU/L (ref 0–44)
AST: 19 IU/L (ref 0–40)
Albumin: 4.2 g/dL (ref 3.8–4.8)
Alkaline Phosphatase: 66 IU/L (ref 44–121)
BUN/Creatinine Ratio: 14 (ref 10–24)
BUN: 23 mg/dL (ref 8–27)
Bilirubin Total: 0.5 mg/dL (ref 0.0–1.2)
CO2: 23 mmol/L (ref 20–29)
Calcium: 9.8 mg/dL (ref 8.6–10.2)
Chloride: 98 mmol/L (ref 96–106)
Creatinine, Ser: 1.63 mg/dL — ABNORMAL HIGH (ref 0.76–1.27)
Globulin, Total: 3 g/dL (ref 1.5–4.5)
Glucose: 148 mg/dL — ABNORMAL HIGH (ref 70–99)
Potassium: 4.8 mmol/L (ref 3.5–5.2)
Sodium: 140 mmol/L (ref 134–144)
Total Protein: 7.2 g/dL (ref 6.0–8.5)
eGFR: 44 mL/min/{1.73_m2} — ABNORMAL LOW (ref >59)

## 2023-01-29 LAB — CBC WITH DIFFERENTIAL/PLATELET
Basophils Absolute: 0.1 10*3/uL (ref 0.0–0.2)
Basos: 1 %
EOS (ABSOLUTE): 0.2 10*3/uL (ref 0.0–0.4)
Eos: 2 %
Hematocrit: 41.2 % (ref 37.5–51.0)
Hemoglobin: 12.1 g/dL — ABNORMAL LOW (ref 13.0–17.7)
Immature Grans (Abs): 0 10*3/uL (ref 0.0–0.1)
Immature Granulocytes: 0 %
Lymphocytes Absolute: 1.2 10*3/uL (ref 0.7–3.1)
Lymphs: 12 %
MCH: 24.7 pg — ABNORMAL LOW (ref 26.6–33.0)
MCHC: 29.4 g/dL — ABNORMAL LOW (ref 31.5–35.7)
MCV: 84 fL (ref 79–97)
Monocytes Absolute: 0.8 10*3/uL (ref 0.1–0.9)
Monocytes: 8 %
Neutrophils Absolute: 7.9 10*3/uL — ABNORMAL HIGH (ref 1.4–7.0)
Neutrophils: 77 %
Platelets: 333 10*3/uL (ref 150–450)
RBC: 4.89 x10E6/uL (ref 4.14–5.80)
RDW: 14.4 % (ref 11.6–15.4)
WBC: 10.2 10*3/uL (ref 3.4–10.8)

## 2023-01-29 LAB — LIPID PANEL
Chol/HDL Ratio: 5.2 ratio — ABNORMAL HIGH (ref 0.0–5.0)
Cholesterol, Total: 135 mg/dL (ref 100–199)
HDL: 26 mg/dL — ABNORMAL LOW (ref >39)
LDL Chol Calc (NIH): 79 mg/dL (ref 0–99)
Triglycerides: 176 mg/dL — ABNORMAL HIGH (ref 0–149)
VLDL Cholesterol Cal: 30 mg/dL (ref 5–40)

## 2023-01-29 LAB — MICROALBUMIN / CREATININE URINE RATIO
Creatinine, Urine: 93.1 mg/dL
Microalb/Creat Ratio: 75 mg/g{creat} — ABNORMAL HIGH (ref 0–29)
Microalbumin, Urine: 69.4 ug/mL

## 2023-01-29 LAB — TSH: TSH: 3.17 u[IU]/mL (ref 0.450–4.500)

## 2023-01-29 LAB — PSA, TOTAL AND FREE
PSA, Free Pct: 26.7 %
PSA, Free: 0.24 ng/mL
Prostate Specific Ag, Serum: 0.9 ng/mL (ref 0.0–4.0)

## 2023-01-29 MED ORDER — MOLNUPIRAVIR EUA 200MG CAPSULE
4.0000 | ORAL_CAPSULE | Freq: Two times a day (BID) | ORAL | 0 refills | Status: DC
Start: 2023-01-29 — End: 2023-02-02

## 2023-01-29 NOTE — Telephone Encounter (Signed)
Wife calling because patient had chills all night and a fever, he just saw PCP yesterday 9/12 and she wants to know if he needs to continue the antibiotic.

## 2023-01-29 NOTE — Telephone Encounter (Signed)
COVID positive, rest, force fluids, tylenol as needed, report any worsening symptoms such as increased shortness of breath, swelling, or continued high fevers. Possible adverse effects discussed with antivirals.

## 2023-01-29 NOTE — Telephone Encounter (Signed)
Will covid test and call back

## 2023-01-29 NOTE — Telephone Encounter (Signed)
Patient aware and verbalized understanding.

## 2023-01-29 NOTE — Telephone Encounter (Signed)
Him and his wife are both + for COVID

## 2023-01-31 DIAGNOSIS — Z743 Need for continuous supervision: Secondary | ICD-10-CM | POA: Diagnosis not present

## 2023-01-31 DIAGNOSIS — R109 Unspecified abdominal pain: Secondary | ICD-10-CM | POA: Diagnosis not present

## 2023-01-31 DIAGNOSIS — U071 COVID-19: Secondary | ICD-10-CM | POA: Diagnosis not present

## 2023-01-31 DIAGNOSIS — R404 Transient alteration of awareness: Secondary | ICD-10-CM | POA: Diagnosis not present

## 2023-01-31 DIAGNOSIS — I499 Cardiac arrhythmia, unspecified: Secondary | ICD-10-CM | POA: Diagnosis not present

## 2023-02-01 ENCOUNTER — Encounter: Payer: Self-pay | Admitting: *Deleted

## 2023-02-01 ENCOUNTER — Ambulatory Visit: Payer: Self-pay | Admitting: *Deleted

## 2023-02-01 NOTE — Patient Instructions (Signed)
Visit Information  Thank you for taking time to visit with me today. Please don't hesitate to contact me if I can be of assistance to you.   Following are the goals we discussed today:   Goals Addressed               This Visit's Progress     COMPLETED: Obtain Counseling Resources and Supportive Services. (pt-stated)   On track     Care Coordination Interventions:  Interventions Today    Flowsheet Row Most Recent Value  Chronic Disease   Chronic disease during today's visit Congestive Heart Failure (CHF), Hypertension (HTN), Diabetes, Other  [Inability to Perform Activities of Daily Living Independently, Generalized Weakness, Anxiety & Depression]  General Interventions   General Interventions Discussed/Reviewed General Interventions Discussed, Labs, Vaccines, Doctor Visits, Annual Foot Exam, Health Screening, General Interventions Reviewed, Annual Eye Exam, Durable Medical Equipment (DME), Community Resources, Level of Care, Communication with  [Encouraged]  Labs Hgb A1c every 3 months  [Encouraged]  Vaccines COVID-19, Flu, Pneumonia, RSV, Shingles, Tetanus/Pertussis/Diphtheria  [Encouraged]  Doctor Visits Discussed/Reviewed Doctor Visits Discussed, Doctor Visits Reviewed, Annual Wellness Visits, PCP, Specialist  [Encouraged]  Health Screening Bone Density, Colonoscopy, Prostate  [Encouraged]  Durable Medical Equipment (DME) Wheelchair, BP Cuff  Wheelchair Standard  PCP/Specialist Visits Compliance with follow-up visit  [Encouraged]  Communication with PCP/Specialists, RN  [Encouraged]  Level of Care Adult Daycare, Personal Care Services, Applications, Assisted Living, Skilled Nursing Facility  [Encouraged]  Applications Medicaid, Personal Care Services  [Encouraged]  Exercise Interventions   Exercise Discussed/Reviewed Exercise Discussed, Assistive device use and maintanence, Exercise Reviewed, Physical Activity  [Encouraged]  Physical Activity Discussed/Reviewed Physical  Activity Discussed, Home Exercise Program (HEP), Physical Activity Reviewed, Types of exercise  [Encouraged]  Weight Management Weight maintenance  [Encouraged]  Education Interventions   Education Provided Provided Therapist, sports, Provided Web-based Education, Provided Education  Provided Verbal Education On Nutrition, Mental Health/Coping with Illness, When to see the doctor, Foot Care, Eye Care, Labs, Blood Sugar Monitoring, Applications, Exercise, Medication, Development worker, community, [employer] Resources  [Encouraged]  Labs Reviewed Hgb A1c  [Encouraged]  Applications Medicaid, Personal Care Services  [Encouraged]  Mental Health Interventions   Mental Health Discussed/Reviewed Mental Health Discussed, Anxiety, Depression, Mental Health Reviewed, Grief and Loss, Coping Strategies, Substance Abuse, Crisis, Suicide, Other  [Domestic Violence]  Nutrition Interventions   Nutrition Discussed/Reviewed Nutrition Discussed, Adding fruits and vegetables, Increaing proteins, Decreasing fats, Decreasing salt, Nutrition Reviewed, Fluid intake, Carbohydrate meal planning, Portion sizes, Decreasing sugar intake  [Encouraged]  Pharmacy Interventions   Pharmacy Dicussed/Reviewed Pharmacy Topics Discussed, Pharmacy Topics Reviewed, Medication Adherence, Affording Medications  [Encouraged]  Safety Interventions   Safety Discussed/Reviewed Safety Discussed, Safety Reviewed, Fall Risk, Home Safety  [Encouraged]  Home Safety Assistive Devices, Need for home safety assessment, Refer for community resources  [Encouraged]  Advanced Directive Interventions   Advanced Directives Discussed/Reviewed Advanced Directives Discussed  [Advance Directives in Place]      Active Listening & Reflection Utilized.  Verbalization of Feelings Encouraged.  Emotional Support Provided. Feelings of Sadness & Grief Validated. Symptoms of Depression & Loss Acknowledged. Grief & Loss Resources Reviewed. Grief & Loss Support Groups  Provided. Self-Enrollment in Grief & Loss Support Group of Interest Emphasized. Acceptance & Commitment Therapy Implemented. Cognitive Behavioral Therapy Performed. Client-Centered Therapy Initiated. CSW Collaboration with Wife, [name] [name] to Confirm Patient's Death on 2023-02-25 & Offer Regrets & Condolences. CSW Collaboration with Wife, [name] [name] to [employer] with CSW [phone]), If She Has Questions, Needs  Assistance, or If Additional Social Work Needs Are Identified in The Near Future.      Please call the care guide team at [phone] if you need to cancel or reschedule your appointment.   If you are experiencing a Mental Health or Behavioral Health Crisis or need someone to talk to, please call the Suicide and Crisis Lifeline: 988 call the [place] National Suicide Prevention Lifeline: 508-491-6292 or TTY: 910-419-1883 TTY 367-879-4496) to talk to a trained counselor call 1-800-273-TALK (toll free, 24 hour hotline) go to [facility] [address], [place] [phone]) call the [facility] Crisis Line: [phone] call 911  Patient verbalizes understanding of instructions and care plan provided today and agrees to view in MyChart. Active MyChart status and patient understanding of how to access instructions and care plan via MyChart confirmed with patient.     No further follow up required.  [name] [name], BSW, MSW, Printmaker Social Work Case Set designer Health  [facility], Population Health Direct Dial: [phone]  Fax: [phone] Email: [email or web address]: [email or web address]

## 2023-02-01 NOTE — Patient Outreach (Signed)
Care Coordination   Follow Up Visit Note   02/01/2023  Name: [name] [name] MRN: [id number] DOB: 07/30/1950  [name] [name] is a 72 y.o. year old male who sees [place], Edilia Bo, FNP for primary care. I spoke with patient's wife, [name] [name] by phone today.  What matters to the patients health and wellness today?  Obtain Counseling Resources and Cytogeneticist.    Goals Addressed               This Visit's Progress     COMPLETED: Obtain Counseling Resources and Supportive Services. (pt-stated)   On track     Care Coordination Interventions:  Interventions Today    Flowsheet Row Most Recent Value  Chronic Disease   Chronic disease during today's visit Congestive Heart Failure (CHF), Hypertension (HTN), Diabetes, Other  [Inability to Perform Activities of Daily Living Independently, Generalized Weakness, Anxiety & Depression]  General Interventions   General Interventions Discussed/Reviewed General Interventions Discussed, Labs, Vaccines, Doctor Visits, Annual Foot Exam, Health Screening, General Interventions Reviewed, Annual Eye Exam, Durable Medical Equipment (DME), Community Resources, Level of Care, Communication with  [Encouraged]  Labs Hgb A1c every 3 months  [Encouraged]  Vaccines COVID-19, Flu, Pneumonia, RSV, Shingles, Tetanus/Pertussis/Diphtheria  [Encouraged]  Doctor Visits Discussed/Reviewed Doctor Visits Discussed, Doctor Visits Reviewed, Annual Wellness Visits, PCP, Specialist  [Encouraged]  Health Screening Bone Density, Colonoscopy, Prostate  [Encouraged]  Durable Medical Equipment (DME) Wheelchair, BP Cuff  Wheelchair Standard  PCP/Specialist Visits Compliance with follow-up visit  [Encouraged]  Communication with PCP/Specialists, RN  [Encouraged]  Level of Care Adult Daycare, Personal Care Services, Applications, Assisted Living, Skilled Nursing Facility  [Encouraged]  Applications Medicaid, Personal Care Services  [Encouraged]  Exercise Interventions    Exercise Discussed/Reviewed Exercise Discussed, Assistive device use and maintanence, Exercise Reviewed, Physical Activity  [Encouraged]  Physical Activity Discussed/Reviewed Physical Activity Discussed, Home Exercise Program (HEP), Physical Activity Reviewed, Types of exercise  [Encouraged]  Weight Management Weight maintenance  [Encouraged]  Education Interventions   Education Provided Provided Therapist, sports, Provided Web-based Education, Provided Education  Provided Verbal Education On Nutrition, Mental Health/Coping with Illness, When to see the doctor, Foot Care, Eye Care, Labs, Blood Sugar Monitoring, Applications, Exercise, Medication, Development worker, community, [employer] Resources  [Encouraged]  Labs Reviewed Hgb A1c  [Encouraged]  Applications Medicaid, Personal Care Services  [Encouraged]  Mental Health Interventions   Mental Health Discussed/Reviewed Mental Health Discussed, Anxiety, Depression, Mental Health Reviewed, Grief and Loss, Coping Strategies, Substance Abuse, Crisis, Suicide, Other  [Domestic Violence]  Nutrition Interventions   Nutrition Discussed/Reviewed Nutrition Discussed, Adding fruits and vegetables, Increaing proteins, Decreasing fats, Decreasing salt, Nutrition Reviewed, Fluid intake, Carbohydrate meal planning, Portion sizes, Decreasing sugar intake  [Encouraged]  Pharmacy Interventions   Pharmacy Dicussed/Reviewed Pharmacy Topics Discussed, Pharmacy Topics Reviewed, Medication Adherence, Affording Medications  [Encouraged]  Safety Interventions   Safety Discussed/Reviewed Safety Discussed, Safety Reviewed, Fall Risk, Home Safety  [Encouraged]  Home Safety Assistive Devices, Need for home safety assessment, Refer for community resources  [Encouraged]  Advanced Directive Interventions   Advanced Directives Discussed/Reviewed Advanced Directives Discussed  [Advance Directives in Place]      Active Listening & Reflection Utilized.  Verbalization of Feelings Encouraged.   Emotional Support Provided. Feelings of Sadness & Grief Validated. Symptoms of Depression & Loss Acknowledged. Grief & Loss Resources Reviewed. Grief & Loss Support Groups Provided. Self-Enrollment in Grief & Loss Support Group of Interest Emphasized. Acceptance & Commitment Therapy Implemented. Cognitive Behavioral Therapy Performed. Client-Centered Therapy Initiated. CSW Collaboration with  Wife, [name] [name] to Confirm Patient's Death on 02-14-23 & Offer Regrets & Condolences. CSW Collaboration with Wife, [name] [name] to [employer] with CSW [phone]), If She Has Questions, Needs Assistance, or If Additional Social Work Needs Are Identified in The Near Future.      SDOH assessments and interventions completed:  Yes.  Care Coordination Interventions:  Yes, provided.   Follow up plan: No further intervention required.   Encounter Outcome:  Patient Visit Completed.   Danford Bad, BSW, MSW, Printmaker Social Work Case Set designer Health  [facility], Population Health Direct Dial: [phone]  Fax: [phone] Email: [email or web address]: [email or web address]

## 2023-02-04 ENCOUNTER — Other Ambulatory Visit: Payer: Self-pay | Admitting: Family

## 2023-02-05 ENCOUNTER — Ambulatory Visit: Payer: Self-pay | Admitting: *Deleted

## 2023-02-05 NOTE — Patient Outreach (Signed)
  Care Coordination  Case closure Visit Note   02/05/2023 Name: [name] [name] MRN: [id number] DOB: [birth year]  [name] is a 72 y.o. year old male who sees [place], Edilia Bo, FNP for primary care. I  was notified of pa s   What matters to the patients health and wellness today?  decease    Goals Addressed             This Visit's Progress    COMPLETED: [facility] care coordination services       Interventions Today    Flowsheet Row Most Recent Value  Chronic Disease   Chronic disease during today's visit Other  [informed of patient passing on 12-Feb-2023]              SDOH assessments and interventions completed:  No     Care Coordination Interventions:  No, not indicated   Follow up plan: No further intervention required.   Encounter Outcome:  Patient Visit Completed  Cala Bradford L. Noelle Penner, RN, BSN, CCM [facility] RN Care Manager [phone]

## 2023-02-16 DEATH — deceased

## 2023-02-17 ENCOUNTER — Encounter: Payer: Medicare Other | Admitting: *Deleted

## 2023-04-08 ENCOUNTER — Ambulatory Visit: Payer: Medicare Other | Admitting: Student

## 2023-04-26 ENCOUNTER — Ambulatory Visit: Payer: Medicare Other | Admitting: Family
